# Patient Record
Sex: Male | Born: 1955
Health system: Southern US, Community
[De-identification: ages and names within clinical notes are randomized; demographics above are authoritative.]

## PROBLEM LIST (undated history)

## (undated) DIAGNOSIS — K219 Gastro-esophageal reflux disease without esophagitis: Secondary | ICD-10-CM

## (undated) DIAGNOSIS — E119 Type 2 diabetes mellitus without complications: Secondary | ICD-10-CM

## (undated) DIAGNOSIS — J45909 Unspecified asthma, uncomplicated: Secondary | ICD-10-CM

## (undated) DIAGNOSIS — I350 Nonrheumatic aortic (valve) stenosis: Secondary | ICD-10-CM

## (undated) DIAGNOSIS — I272 Pulmonary hypertension, unspecified: Secondary | ICD-10-CM

## (undated) DIAGNOSIS — D696 Thrombocytopenia, unspecified: Secondary | ICD-10-CM

## (undated) DIAGNOSIS — R011 Cardiac murmur, unspecified: Secondary | ICD-10-CM

## (undated) DIAGNOSIS — I700 Atherosclerosis of aorta: Secondary | ICD-10-CM

## (undated) DIAGNOSIS — I152 Hypertension secondary to endocrine disorders: Secondary | ICD-10-CM

## (undated) DIAGNOSIS — F1291 Cannabis use, unspecified, in remission: Secondary | ICD-10-CM

## (undated) DIAGNOSIS — I739 Peripheral vascular disease, unspecified: Secondary | ICD-10-CM

## (undated) DIAGNOSIS — I2510 Atherosclerotic heart disease of native coronary artery without angina pectoris: Secondary | ICD-10-CM

## (undated) DIAGNOSIS — I21A1 Myocardial infarction type 2: Secondary | ICD-10-CM

## (undated) DIAGNOSIS — E1159 Type 2 diabetes mellitus with other circulatory complications: Secondary | ICD-10-CM

## (undated) DIAGNOSIS — L97509 Non-pressure chronic ulcer of other part of unspecified foot with unspecified severity: Secondary | ICD-10-CM

## (undated) DIAGNOSIS — L97529 Non-pressure chronic ulcer of other part of left foot with unspecified severity: Secondary | ICD-10-CM

## (undated) DIAGNOSIS — I509 Heart failure, unspecified: Secondary | ICD-10-CM

## (undated) DIAGNOSIS — I10 Essential (primary) hypertension: Secondary | ICD-10-CM

## (undated) DIAGNOSIS — N289 Disorder of kidney and ureter, unspecified: Secondary | ICD-10-CM

## (undated) HISTORY — PX: CORONARY ARTERY BYPASS GRAFT: SHX141

## (undated) HISTORY — DX: Heart failure, unspecified: I50.9

## (undated) HISTORY — PX: ROTATOR CUFF REPAIR: SHX139

## (undated) HISTORY — DX: Type 2 diabetes mellitus with other circulatory complications: E11.59

## (undated) HISTORY — PX: CARDIAC SURGERY: SHX584

## (undated) HISTORY — DX: Unspecified asthma, uncomplicated: J45.909

## (undated) HISTORY — DX: Type 2 diabetes mellitus without complications: E11.9

## (undated) HISTORY — PX: CARDIAC CATHETERIZATION: SHX172

## (undated) HISTORY — DX: Hypertension secondary to endocrine disorders: I15.2

## (undated) HISTORY — PX: ANGIOPLASTY: SHX39

## (undated) HISTORY — DX: Essential (primary) hypertension: I10

---

## 1999-10-26 ENCOUNTER — Ambulatory Visit (HOSPITAL_COMMUNITY): Admission: RE | Admit: 1999-10-26 | Discharge: 1999-10-26 | Payer: Self-pay | Admitting: Orthopaedic Surgery

## 1999-11-13 ENCOUNTER — Ambulatory Visit (HOSPITAL_COMMUNITY): Admission: RE | Admit: 1999-11-13 | Discharge: 1999-11-13 | Payer: Self-pay | Admitting: Orthopaedic Surgery

## 1999-11-13 ENCOUNTER — Encounter: Payer: Self-pay | Admitting: Neurosurgery

## 1999-11-27 ENCOUNTER — Ambulatory Visit (HOSPITAL_COMMUNITY): Admission: RE | Admit: 1999-11-27 | Discharge: 1999-11-27 | Payer: Self-pay | Admitting: Orthopaedic Surgery

## 2001-06-03 DIAGNOSIS — I2510 Atherosclerotic heart disease of native coronary artery without angina pectoris: Secondary | ICD-10-CM

## 2001-06-03 DIAGNOSIS — Z951 Presence of aortocoronary bypass graft: Secondary | ICD-10-CM | POA: Insufficient documentation

## 2001-06-03 HISTORY — DX: Presence of aortocoronary bypass graft: Z95.1

## 2001-06-03 HISTORY — DX: Atherosclerotic heart disease of native coronary artery without angina pectoris: I25.10

## 2001-06-03 HISTORY — PX: CORONARY ARTERY BYPASS GRAFT: SHX141

## 2001-06-23 ENCOUNTER — Encounter: Payer: Self-pay | Admitting: Cardiothoracic Surgery

## 2001-06-25 ENCOUNTER — Inpatient Hospital Stay (HOSPITAL_COMMUNITY): Admission: RE | Admit: 2001-06-25 | Discharge: 2001-06-30 | Payer: Self-pay | Admitting: Cardiothoracic Surgery

## 2001-06-25 ENCOUNTER — Encounter: Payer: Self-pay | Admitting: Cardiothoracic Surgery

## 2001-06-26 ENCOUNTER — Encounter: Payer: Self-pay | Admitting: Cardiothoracic Surgery

## 2001-06-27 ENCOUNTER — Encounter: Payer: Self-pay | Admitting: Cardiothoracic Surgery

## 2001-06-28 ENCOUNTER — Encounter: Payer: Self-pay | Admitting: Surgery

## 2001-06-29 ENCOUNTER — Encounter: Payer: Self-pay | Admitting: Cardiothoracic Surgery

## 2005-01-23 ENCOUNTER — Inpatient Hospital Stay (HOSPITAL_COMMUNITY): Admission: EM | Admit: 2005-01-23 | Discharge: 2005-01-26 | Payer: Self-pay | Admitting: Emergency Medicine

## 2005-01-23 IMAGING — CR DG CHEST 1V PORT
1 series · 1 of 1 positions shown · non-contrast
Comparison: Report dated [DATE].

CLINICAL DATA: Chest pain. Short of breath. Hypertension. Diabetes. Previous
CABG.

PORTABLE CHEST - 1 VIEW

[view not recorded]
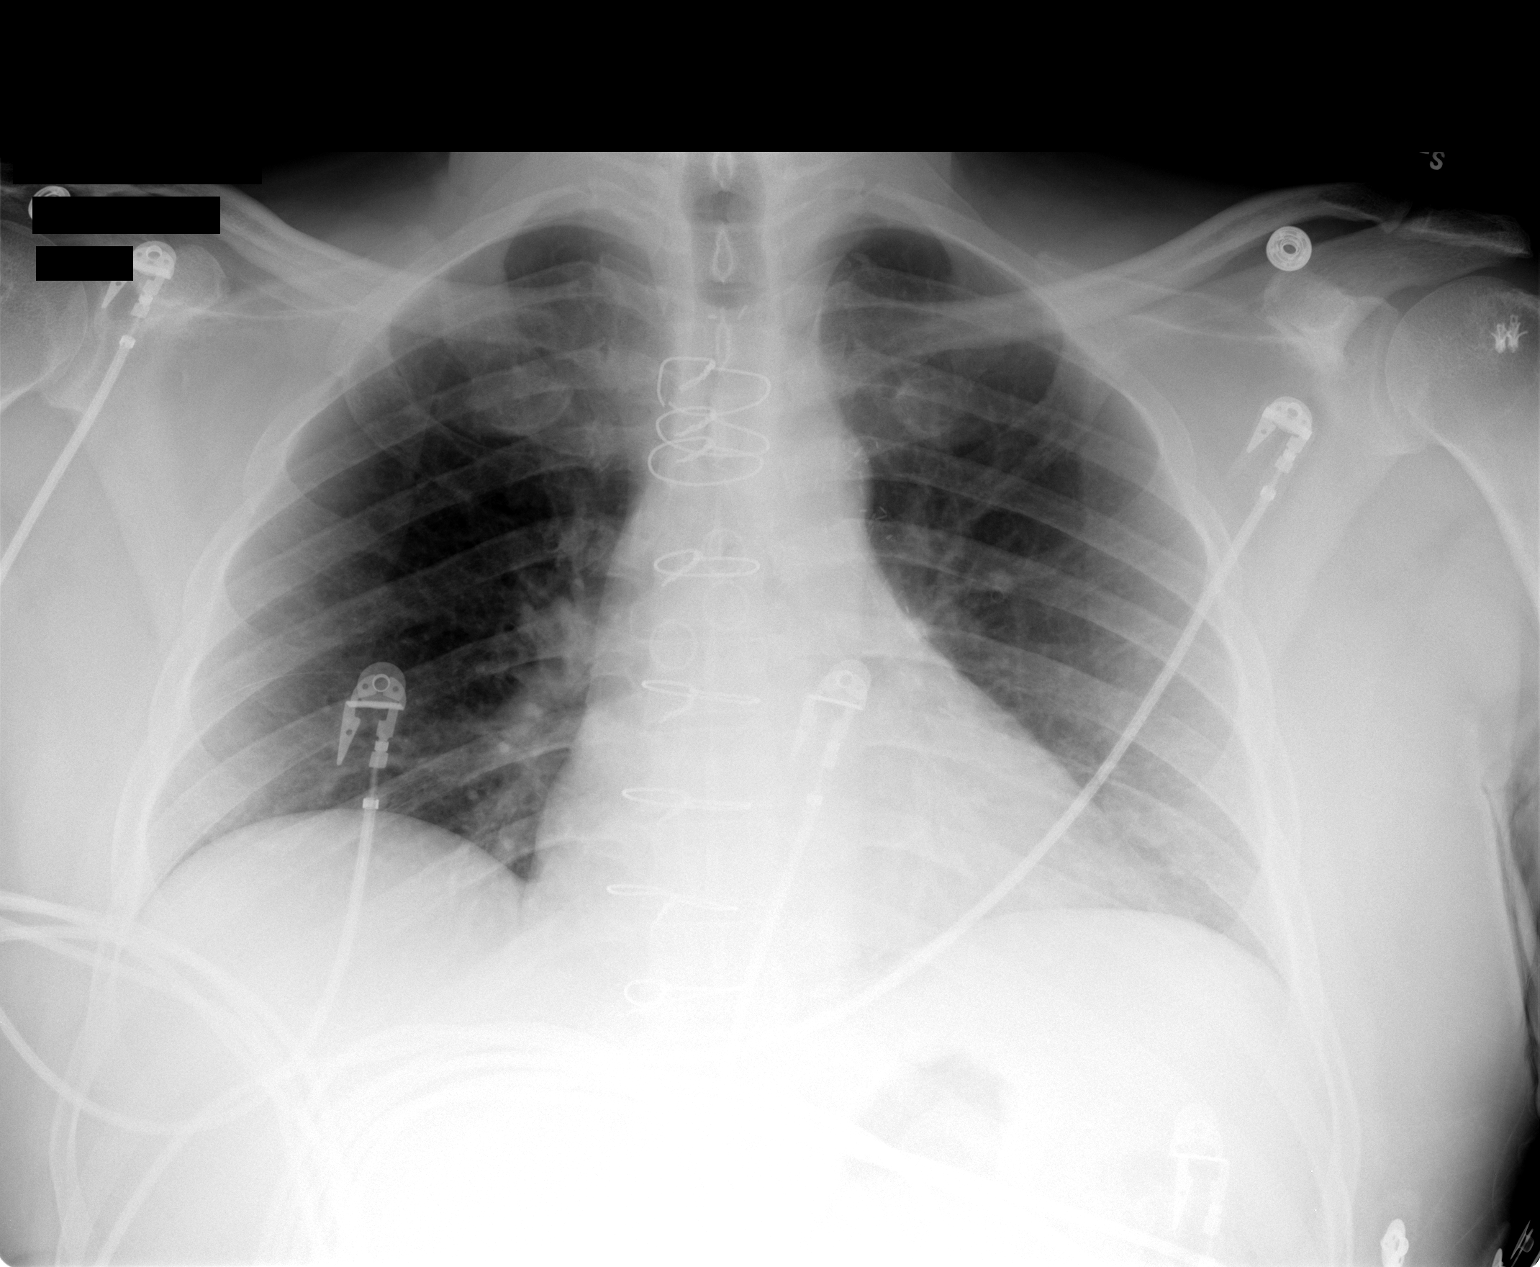

[1 of 1 positions shown; findings below may reference images not displayed]

FINDINGS: Poor inspiration. Normal sized heart. Post CABG changes. Clear lungs
with normal vascularity. Unremarkable bones.  

IMPRESSION

No acute abnormality.

## 2013-08-23 ENCOUNTER — Ambulatory Visit (INDEPENDENT_AMBULATORY_CARE_PROVIDER_SITE_OTHER): Payer: BC Managed Care – PPO | Admitting: Podiatry

## 2013-08-23 ENCOUNTER — Ambulatory Visit (INDEPENDENT_AMBULATORY_CARE_PROVIDER_SITE_OTHER): Payer: BC Managed Care – PPO

## 2013-08-23 ENCOUNTER — Encounter: Payer: Self-pay | Admitting: Podiatry

## 2013-08-23 VITALS — BP 157/99 | HR 75 | Resp 18 | Ht 72.0 in | Wt 220.0 lb

## 2013-08-23 DIAGNOSIS — E1059 Type 1 diabetes mellitus with other circulatory complications: Secondary | ICD-10-CM

## 2013-08-23 DIAGNOSIS — M79609 Pain in unspecified limb: Secondary | ICD-10-CM

## 2013-08-23 DIAGNOSIS — E1049 Type 1 diabetes mellitus with other diabetic neurological complication: Secondary | ICD-10-CM

## 2013-08-23 DIAGNOSIS — M79672 Pain in left foot: Principal | ICD-10-CM

## 2013-08-23 DIAGNOSIS — M79671 Pain in right foot: Secondary | ICD-10-CM

## 2013-08-23 NOTE — Progress Notes (Signed)
   Subjective:    Patient ID: Phillip [name], male    DOB: 09-01-1955, 58 y.o.   MRN: [id number]  HPI Comments: Diabetic and been having a lot of trouble with feet , burning stinging , tingling. Taking gabapentin and it is not working . Diabetic since 2003 . Last blood sugar was done Saturday morning and it was over 300 fasting   Diabetes      Review of Systems  Endocrine:       Diabetic  Neurological: Positive for numbness.  All other systems reviewed and are negative.       Objective:   Physical Exam: I have reviewed his past history medications allergies surgeries and social history. He states that his blood sugar routinely runs off the chart. Pulses are nonpalpable bilateral capillary fill time to digits one through 5 is sluggish. Neurologic sensorium is not intact to the midfoot bilateral. Deep tendon reflexes are not elicitable bilateral. Orthopedic evaluation demonstrates rectus foot type mild pes planus mild hallux abductovalgus and hammertoe deformities but are asymptomatic. Muscle strength +5 over 5 dorsiflexors plantar flexors inverters and evertors. Radiographic evaluation demonstrates mild pes planus osteoarthritic changes midfoot bilateral. Cutaneous evaluation demonstrates no erythema edema cellulitis drainage or odor he does have hemosiderin deposition or what appears to be small petechiae. These lesions seem to coalesce into a large mass.        Assessment & Plan:  Assessment: Severe peripheral neuropathy secondary to diabetes. Severe diabetic peripheral angiopathy.  Plan: Discussed etiology pathology conservative versus surgical therapies I suggested that he be seen immediately by vascular surgery. Once this is taking care of he will followup with Dr. crisp or chronic diabetic peripheral neuropathy.

## 2013-08-23 NOTE — Patient Instructions (Signed)
Diabetes and Foot Care Diabetes may cause you to have problems because of poor blood supply (circulation) to your feet and legs. This may cause the skin on your feet to become thinner, break easier, and heal more slowly. Your skin may become dry, and the skin may peel and crack. You may also have nerve damage in your legs and feet causing decreased feeling in them. You may not notice minor injuries to your feet that could lead to infections or more serious problems. Taking care of your feet is one of the most important things you can do for yourself.  HOME CARE INSTRUCTIONS  Wear shoes at all times, even in the house. Do not go barefoot. Bare feet are easily injured.  Check your feet daily for blisters, cuts, and redness. If you cannot see the bottom of your feet, use a mirror or ask someone for help.  Wash your feet with warm water (do not use hot water) and mild soap. Then pat your feet and the areas between your toes until they are completely dry. Do not soak your feet as this can dry your skin.  Apply a moisturizing lotion or petroleum jelly (that does not contain alcohol and is unscented) to the skin on your feet and to dry, brittle toenails. Do not apply lotion between your toes.  Trim your toenails straight across. Do not dig under them or around the cuticle. File the edges of your nails with an emery board or nail file.  Do not cut corns or calluses or try to remove them with medicine.  Wear clean socks or stockings every day. Make sure they are not too tight. Do not wear knee-high stockings since they may decrease blood flow to your legs.  Wear shoes that fit properly and have enough cushioning. To break in new shoes, wear them for just a few hours a day. This prevents you from injuring your feet. Always look in your shoes before you put them on to be sure there are no objects inside.  Do not cross your legs. This may decrease the blood flow to your feet.  If you find a minor scrape,  cut, or break in the skin on your feet, keep it and the skin around it clean and dry. These areas may be cleansed with mild soap and water. Do not cleanse the area with peroxide, alcohol, or iodine.  When you remove an adhesive bandage, be sure not to damage the skin around it.  If you have a wound, look at it several times a day to make sure it is healing.  Do not use heating pads or hot water bottles. They may burn your skin. If you have lost feeling in your feet or legs, you may not know it is happening until it is too late.  Make sure your health care provider performs a complete foot exam at least annually or more often if you have foot problems. Report any cuts, sores, or bruises to your health care provider immediately. SEEK MEDICAL CARE IF:   You have an injury that is not healing.  You have cuts or breaks in the skin.  You have an ingrown nail.  You notice redness on your legs or feet.  You feel burning or tingling in your legs or feet.  You have pain or cramps in your legs and feet.  Your legs or feet are numb.  Your feet always feel cold. SEEK IMMEDIATE MEDICAL CARE IF:   There is increasing redness,   swelling, or pain in or around a wound.  There is a red line that goes up your leg.  Pus is coming from a wound.  You develop a fever or as directed by your health care provider.  You notice a bad smell coming from an ulcer or wound. Document Released: 05/17/2000 Document Revised: 01/20/2013 Document Reviewed: 10/27/2012 [employer] Patient Information 2014 [employer].  

## 2013-08-30 ENCOUNTER — Ambulatory Visit: Payer: Self-pay | Admitting: Vascular Surgery

## 2013-08-30 LAB — BASIC METABOLIC PANEL
Anion Gap: 4 — ABNORMAL LOW (ref 7–16)
BUN: 17 mg/dL (ref 7–18)
CHLORIDE: 101 mmol/L (ref 98–107)
CO2: 31 mmol/L (ref 21–32)
Calcium, Total: 9.4 mg/dL (ref 8.5–10.1)
Creatinine: 1.15 mg/dL (ref 0.60–1.30)
EGFR (Non-African Amer.): >60
Glucose: 310 mg/dL — ABNORMAL HIGH (ref 65–99)
Osmolality: 285 (ref 275–301)
Potassium: 3.7 mmol/L (ref 3.5–5.1)
Sodium: 136 mmol/L (ref 136–145)

## 2014-09-24 NOTE — Op Note (Signed)
PATIENT NAME:  KASAN, LIGHTLE MR#:  [id number] DATE OF BIRTH:  1955-10-31  DATE OF PROCEDURE:  08/30/2013  PREOPERATIVE DIAGNOSES:  1.  Peripheral arterial disease with claudication, right lower extremity.  2.  Neuropathy, both lower extremities.  3.  Diabetes.  4.  Hypertension.  5.  Coronary disease.   POSTOPERATIVE DIAGNOSES: 1.  Peripheral arterial disease with claudication, right lower extremity.  2.  Neuropathy, both lower extremities.  3.  Diabetes.  4.  Hypertension.  5.  Coronary disease.   PROCEDURES:  1.  Catheter placement into right peroneal artery from left femoral approach.  2.  Aortogram and selective right lower extremity angiogram.  3.  Percutaneous transluminal angioplasty of right tibioperoneal trunk and peroneal arteries with 3 mm diameter angioplasty balloon.  4.  Percutaneous transluminal angioplasty of right popliteal artery with a 4 mm diameter angioplasty balloon.  5.  StarClose closure device, left femoral artery.   SURGEON: Leotis Pain, M.D.   ANESTHESIA: Local with moderate conscious sedation.   ESTIMATED BLOOD LOSS: 25 mL.  FLUOROSCOPY TIME: Approximately 6 minutes.   CONTRAST USED: 55 mL.   INDICATION FOR PROCEDURE: This is a 59 year old male, who was referred to my office for evaluation of peripheral vascular disease. He was found to have a significant difference in his ABIs and his right lower extremity was the more painful leg. He also had some neuropathic symptoms. He described cramping pain in his muscles with activity that was very consistent with claudication. His ABI on the right was reduced and he is brought in for angiography for further evaluation and potential treatment. Risks and benefits were discussed. Informed consent was obtained.   DESCRIPTION OF PROCEDURE: The patient is brought to the vascular suite. Groins were shaved and prepped and a sterile surgical field was created. The left femoral head was localized with fluoroscopy, left  femoral artery was accessed without difficulty with a Seldinger needle. A J-wire and a 5-French sheath were then placed. Pigtail catheter was placed at the L-1 level and AP aortogram was performed. This demonstrated co-dominant right renal arteries and a single left renal artery. All of these were widely patent. The aorta and iliac segments were widely patent. I then crossed the aortic bifurcation and advanced to the right femoral head and selective right lower extremity angiogram was then performed. This demonstrated no significant stenosis in the common femoral artery, profunda femoris artery or superficial femoral artery. The popliteal artery was patent above the knee. Below the knee, there was an area of stenosis at the tibial trifurcation just above the anterior tibial artery. The tibioperoneal trunk then had some moderate disease, but occluded in the distal tibioperoneal trunk in a separate distinct lesion. The peroneal artery received the best runoff distally reconstituting after being occluded for the first 8 to 10 cm. The anterior tibial artery was patent proximally, but then occluded in the mid leg and helped fill the foot through collaterals. The posterior tibial artery was chronically occluded. The patient was systemically heparinized. A 6-French Ansell sheath was placed over a Terumo advantage wire. I was able to, with the help of a Kumpe and then a CXI catheter, a 0.035 Advantage wire, V18 wire and then an 0.018 Advantage wire traversed through the popliteal stenosis initially, then the tibioperoneal trunk occlusion. I was able to navigate down into the peroneal artery and crossed the occlusion with a CXI catheter and the 0.018 Advantage wire and confirm intraluminal flow in the peroneal artery distally. I then replaced  the wire.   The peroneal occlusion and tibioperoneal trunk occlusion were treated with a 3 mm diameter angioplasty balloon with a good angiographic completion result. I then used a 4  mm diameter Lutonix angioplasty balloon to treat the proximal tibioperoneal trunk and distal popliteal artery, which was a separate distinct lesion. The drug-coated balloon was inflated for 1 minute and then deflated and completion angiogram now showed this area to be widely patent with less than 20% residual stenosis. At this point, I elected to terminate the procedure. The sheath was pulled back to the ipsilateral external iliac artery and oblique arteriogram was performed. StarClose closure device was deployed in the usual fashion with excellent hemostatic result. The patient tolerated the procedure well and was taken to the recovery room in stable condition.  ____________________________ Algernon Huxley, MD jsd:aw D: 08/30/2013 12:32:54 ET T: 08/30/2013 12:55:47 ET JOB#: [id number]  cc: Algernon Huxley, MD, <Dictator> Cyndi Bender, MD Algernon Huxley MD ELECTRONICALLY SIGNED 09/06/2013 9:51

## 2015-07-26 DIAGNOSIS — R1310 Dysphagia, unspecified: Secondary | ICD-10-CM | POA: Insufficient documentation

## 2015-07-26 DIAGNOSIS — R0989 Other specified symptoms and signs involving the circulatory and respiratory systems: Secondary | ICD-10-CM

## 2015-07-26 DIAGNOSIS — I2510 Atherosclerotic heart disease of native coronary artery without angina pectoris: Secondary | ICD-10-CM

## 2015-07-26 DIAGNOSIS — R011 Cardiac murmur, unspecified: Secondary | ICD-10-CM | POA: Insufficient documentation

## 2015-07-26 DIAGNOSIS — G4730 Sleep apnea, unspecified: Secondary | ICD-10-CM | POA: Insufficient documentation

## 2015-07-26 DIAGNOSIS — Z79899 Other long term (current) drug therapy: Secondary | ICD-10-CM

## 2015-07-26 DIAGNOSIS — Z794 Long term (current) use of insulin: Secondary | ICD-10-CM

## 2015-07-26 DIAGNOSIS — E785 Hyperlipidemia, unspecified: Secondary | ICD-10-CM | POA: Insufficient documentation

## 2015-07-26 DIAGNOSIS — N183 Chronic kidney disease, stage 3 unspecified: Secondary | ICD-10-CM

## 2015-07-26 DIAGNOSIS — I509 Heart failure, unspecified: Secondary | ICD-10-CM

## 2015-07-26 DIAGNOSIS — I5022 Chronic systolic (congestive) heart failure: Secondary | ICD-10-CM | POA: Insufficient documentation

## 2015-07-26 DIAGNOSIS — I5042 Chronic combined systolic (congestive) and diastolic (congestive) heart failure: Secondary | ICD-10-CM

## 2015-07-26 DIAGNOSIS — E1122 Type 2 diabetes mellitus with diabetic chronic kidney disease: Secondary | ICD-10-CM | POA: Insufficient documentation

## 2015-07-26 DIAGNOSIS — N184 Chronic kidney disease, stage 4 (severe): Secondary | ICD-10-CM | POA: Insufficient documentation

## 2015-07-26 DIAGNOSIS — E1169 Type 2 diabetes mellitus with other specified complication: Secondary | ICD-10-CM | POA: Insufficient documentation

## 2015-07-26 DIAGNOSIS — I739 Peripheral vascular disease, unspecified: Secondary | ICD-10-CM

## 2015-07-26 DIAGNOSIS — I5023 Acute on chronic systolic (congestive) heart failure: Secondary | ICD-10-CM | POA: Insufficient documentation

## 2015-07-26 HISTORY — DX: Cardiac murmur, unspecified: R01.1

## 2015-07-26 HISTORY — DX: Other long term (current) drug therapy: Z79.899

## 2015-07-26 HISTORY — DX: Sleep apnea, unspecified: G47.30

## 2015-07-26 HISTORY — DX: Other specified symptoms and signs involving the circulatory and respiratory systems: R09.89

## 2015-07-26 HISTORY — DX: Type 2 diabetes mellitus with diabetic chronic kidney disease: E11.22

## 2015-07-26 HISTORY — DX: Type 2 diabetes mellitus with diabetic chronic kidney disease: N18.4

## 2015-07-26 HISTORY — DX: Chronic systolic (congestive) heart failure: I50.22

## 2015-07-26 HISTORY — DX: Atherosclerotic heart disease of native coronary artery without angina pectoris: I25.10

## 2015-07-26 HISTORY — DX: Chronic kidney disease, stage 3 unspecified: N18.30

## 2015-07-26 HISTORY — DX: Long term (current) use of insulin: Z79.4

## 2015-07-26 HISTORY — DX: Chronic combined systolic (congestive) and diastolic (congestive) heart failure: I50.42

## 2015-07-26 HISTORY — DX: Heart failure, unspecified: I50.9

## 2015-07-26 HISTORY — DX: Hyperlipidemia, unspecified: E78.5

## 2015-07-26 HISTORY — DX: Peripheral vascular disease, unspecified: I73.9

## 2015-07-26 HISTORY — DX: Dysphagia, unspecified: R13.10

## 2015-07-26 HISTORY — DX: Acute on chronic systolic (congestive) heart failure: I50.23

## 2015-07-27 DIAGNOSIS — R319 Hematuria, unspecified: Secondary | ICD-10-CM | POA: Insufficient documentation

## 2015-07-27 HISTORY — DX: Hematuria, unspecified: R31.9

## 2015-11-14 DIAGNOSIS — E039 Hypothyroidism, unspecified: Secondary | ICD-10-CM | POA: Insufficient documentation

## 2015-11-14 HISTORY — DX: Hypothyroidism, unspecified: E03.9

## 2016-06-12 DIAGNOSIS — E113413 Type 2 diabetes mellitus with severe nonproliferative diabetic retinopathy with macular edema, bilateral: Secondary | ICD-10-CM | POA: Diagnosis not present

## 2016-06-12 DIAGNOSIS — Z794 Long term (current) use of insulin: Secondary | ICD-10-CM | POA: Diagnosis not present

## 2016-06-12 DIAGNOSIS — H2513 Age-related nuclear cataract, bilateral: Secondary | ICD-10-CM | POA: Diagnosis not present

## 2016-07-31 DIAGNOSIS — H2513 Age-related nuclear cataract, bilateral: Secondary | ICD-10-CM | POA: Diagnosis not present

## 2016-07-31 DIAGNOSIS — E113413 Type 2 diabetes mellitus with severe nonproliferative diabetic retinopathy with macular edema, bilateral: Secondary | ICD-10-CM | POA: Diagnosis not present

## 2016-09-18 DIAGNOSIS — H2513 Age-related nuclear cataract, bilateral: Secondary | ICD-10-CM | POA: Diagnosis not present

## 2016-09-18 DIAGNOSIS — E113413 Type 2 diabetes mellitus with severe nonproliferative diabetic retinopathy with macular edema, bilateral: Secondary | ICD-10-CM | POA: Diagnosis not present

## 2016-10-16 DIAGNOSIS — H2513 Age-related nuclear cataract, bilateral: Secondary | ICD-10-CM | POA: Diagnosis not present

## 2016-10-16 DIAGNOSIS — E113413 Type 2 diabetes mellitus with severe nonproliferative diabetic retinopathy with macular edema, bilateral: Secondary | ICD-10-CM | POA: Diagnosis not present

## 2017-01-08 DIAGNOSIS — E1140 Type 2 diabetes mellitus with diabetic neuropathy, unspecified: Secondary | ICD-10-CM | POA: Diagnosis not present

## 2017-01-08 DIAGNOSIS — I129 Hypertensive chronic kidney disease with stage 1 through stage 4 chronic kidney disease, or unspecified chronic kidney disease: Secondary | ICD-10-CM | POA: Diagnosis not present

## 2017-01-08 DIAGNOSIS — N183 Chronic kidney disease, stage 3 (moderate): Secondary | ICD-10-CM | POA: Diagnosis not present

## 2017-01-08 DIAGNOSIS — I25118 Atherosclerotic heart disease of native coronary artery with other forms of angina pectoris: Secondary | ICD-10-CM | POA: Diagnosis not present

## 2017-01-08 DIAGNOSIS — I130 Hypertensive heart and chronic kidney disease with heart failure and stage 1 through stage 4 chronic kidney disease, or unspecified chronic kidney disease: Secondary | ICD-10-CM | POA: Diagnosis not present

## 2017-01-08 DIAGNOSIS — E0842 Diabetes mellitus due to underlying condition with diabetic polyneuropathy: Secondary | ICD-10-CM | POA: Diagnosis not present

## 2017-01-08 DIAGNOSIS — I5020 Unspecified systolic (congestive) heart failure: Secondary | ICD-10-CM | POA: Diagnosis not present

## 2017-01-08 DIAGNOSIS — E785 Hyperlipidemia, unspecified: Secondary | ICD-10-CM | POA: Diagnosis not present

## 2017-01-29 DIAGNOSIS — Z794 Long term (current) use of insulin: Secondary | ICD-10-CM | POA: Diagnosis not present

## 2017-01-29 DIAGNOSIS — E113413 Type 2 diabetes mellitus with severe nonproliferative diabetic retinopathy with macular edema, bilateral: Secondary | ICD-10-CM | POA: Diagnosis not present

## 2017-02-26 DIAGNOSIS — E113413 Type 2 diabetes mellitus with severe nonproliferative diabetic retinopathy with macular edema, bilateral: Secondary | ICD-10-CM | POA: Diagnosis not present

## 2017-02-26 DIAGNOSIS — H2513 Age-related nuclear cataract, bilateral: Secondary | ICD-10-CM | POA: Diagnosis not present

## 2017-03-10 DIAGNOSIS — N183 Chronic kidney disease, stage 3 (moderate): Secondary | ICD-10-CM | POA: Diagnosis not present

## 2017-04-09 DIAGNOSIS — H2513 Age-related nuclear cataract, bilateral: Secondary | ICD-10-CM | POA: Diagnosis not present

## 2017-04-09 DIAGNOSIS — E113213 Type 2 diabetes mellitus with mild nonproliferative diabetic retinopathy with macular edema, bilateral: Secondary | ICD-10-CM | POA: Diagnosis not present

## 2017-04-16 DIAGNOSIS — H3581 Retinal edema: Secondary | ICD-10-CM | POA: Diagnosis not present

## 2017-05-21 DIAGNOSIS — E113413 Type 2 diabetes mellitus with severe nonproliferative diabetic retinopathy with macular edema, bilateral: Secondary | ICD-10-CM | POA: Diagnosis not present

## 2017-05-21 DIAGNOSIS — H2513 Age-related nuclear cataract, bilateral: Secondary | ICD-10-CM | POA: Diagnosis not present

## 2017-06-18 DIAGNOSIS — E113213 Type 2 diabetes mellitus with mild nonproliferative diabetic retinopathy with macular edema, bilateral: Secondary | ICD-10-CM | POA: Diagnosis not present

## 2017-06-18 DIAGNOSIS — H2513 Age-related nuclear cataract, bilateral: Secondary | ICD-10-CM | POA: Diagnosis not present

## 2017-07-14 DIAGNOSIS — N529 Male erectile dysfunction, unspecified: Secondary | ICD-10-CM | POA: Insufficient documentation

## 2017-07-14 DIAGNOSIS — E1140 Type 2 diabetes mellitus with diabetic neuropathy, unspecified: Secondary | ICD-10-CM | POA: Diagnosis not present

## 2017-07-14 DIAGNOSIS — E0842 Diabetes mellitus due to underlying condition with diabetic polyneuropathy: Secondary | ICD-10-CM | POA: Diagnosis not present

## 2017-07-14 DIAGNOSIS — E782 Mixed hyperlipidemia: Secondary | ICD-10-CM | POA: Diagnosis not present

## 2017-07-14 DIAGNOSIS — I5020 Unspecified systolic (congestive) heart failure: Secondary | ICD-10-CM | POA: Diagnosis not present

## 2017-07-14 DIAGNOSIS — I130 Hypertensive heart and chronic kidney disease with heart failure and stage 1 through stage 4 chronic kidney disease, or unspecified chronic kidney disease: Secondary | ICD-10-CM | POA: Diagnosis not present

## 2017-07-14 DIAGNOSIS — E039 Hypothyroidism, unspecified: Secondary | ICD-10-CM | POA: Diagnosis not present

## 2017-07-14 DIAGNOSIS — I25118 Atherosclerotic heart disease of native coronary artery with other forms of angina pectoris: Secondary | ICD-10-CM | POA: Diagnosis not present

## 2017-07-14 HISTORY — DX: Male erectile dysfunction, unspecified: N52.9

## 2017-07-18 DIAGNOSIS — E113413 Type 2 diabetes mellitus with severe nonproliferative diabetic retinopathy with macular edema, bilateral: Secondary | ICD-10-CM | POA: Diagnosis not present

## 2017-07-18 DIAGNOSIS — H2513 Age-related nuclear cataract, bilateral: Secondary | ICD-10-CM | POA: Diagnosis not present

## 2017-07-18 DIAGNOSIS — Z794 Long term (current) use of insulin: Secondary | ICD-10-CM | POA: Diagnosis not present

## 2017-08-12 DIAGNOSIS — Z79899 Other long term (current) drug therapy: Secondary | ICD-10-CM | POA: Diagnosis not present

## 2017-08-12 DIAGNOSIS — I129 Hypertensive chronic kidney disease with stage 1 through stage 4 chronic kidney disease, or unspecified chronic kidney disease: Secondary | ICD-10-CM | POA: Diagnosis not present

## 2017-08-12 DIAGNOSIS — N183 Chronic kidney disease, stage 3 (moderate): Secondary | ICD-10-CM | POA: Diagnosis not present

## 2017-08-12 DIAGNOSIS — E0842 Diabetes mellitus due to underlying condition with diabetic polyneuropathy: Secondary | ICD-10-CM | POA: Diagnosis not present

## 2017-09-05 DIAGNOSIS — E113413 Type 2 diabetes mellitus with severe nonproliferative diabetic retinopathy with macular edema, bilateral: Secondary | ICD-10-CM | POA: Diagnosis not present

## 2017-09-05 DIAGNOSIS — H2513 Age-related nuclear cataract, bilateral: Secondary | ICD-10-CM | POA: Diagnosis not present

## 2017-10-03 DIAGNOSIS — H2513 Age-related nuclear cataract, bilateral: Secondary | ICD-10-CM | POA: Diagnosis not present

## 2017-10-03 DIAGNOSIS — E113413 Type 2 diabetes mellitus with severe nonproliferative diabetic retinopathy with macular edema, bilateral: Secondary | ICD-10-CM | POA: Diagnosis not present

## 2017-10-03 DIAGNOSIS — Z794 Long term (current) use of insulin: Secondary | ICD-10-CM | POA: Diagnosis not present

## 2018-01-14 DIAGNOSIS — E1122 Type 2 diabetes mellitus with diabetic chronic kidney disease: Secondary | ICD-10-CM | POA: Diagnosis not present

## 2018-01-14 DIAGNOSIS — I5020 Unspecified systolic (congestive) heart failure: Secondary | ICD-10-CM | POA: Diagnosis not present

## 2018-01-14 DIAGNOSIS — E039 Hypothyroidism, unspecified: Secondary | ICD-10-CM | POA: Diagnosis not present

## 2018-01-14 DIAGNOSIS — E782 Mixed hyperlipidemia: Secondary | ICD-10-CM | POA: Diagnosis not present

## 2018-01-14 DIAGNOSIS — Z0000 Encounter for general adult medical examination without abnormal findings: Secondary | ICD-10-CM | POA: Diagnosis not present

## 2018-01-14 DIAGNOSIS — I129 Hypertensive chronic kidney disease with stage 1 through stage 4 chronic kidney disease, or unspecified chronic kidney disease: Secondary | ICD-10-CM | POA: Diagnosis not present

## 2018-01-14 DIAGNOSIS — N400 Enlarged prostate without lower urinary tract symptoms: Secondary | ICD-10-CM

## 2018-01-14 DIAGNOSIS — Z79899 Other long term (current) drug therapy: Secondary | ICD-10-CM | POA: Diagnosis not present

## 2018-01-14 DIAGNOSIS — Z125 Encounter for screening for malignant neoplasm of prostate: Secondary | ICD-10-CM | POA: Diagnosis not present

## 2018-01-14 DIAGNOSIS — I25118 Atherosclerotic heart disease of native coronary artery with other forms of angina pectoris: Secondary | ICD-10-CM | POA: Diagnosis not present

## 2018-01-14 DIAGNOSIS — Z794 Long term (current) use of insulin: Secondary | ICD-10-CM | POA: Diagnosis not present

## 2018-01-14 DIAGNOSIS — R319 Hematuria, unspecified: Secondary | ICD-10-CM | POA: Diagnosis not present

## 2018-01-14 DIAGNOSIS — N183 Chronic kidney disease, stage 3 (moderate): Secondary | ICD-10-CM | POA: Diagnosis not present

## 2018-01-14 HISTORY — DX: Benign prostatic hyperplasia without lower urinary tract symptoms: N40.0

## 2018-01-15 DIAGNOSIS — E1122 Type 2 diabetes mellitus with diabetic chronic kidney disease: Secondary | ICD-10-CM | POA: Diagnosis not present

## 2018-01-15 DIAGNOSIS — R319 Hematuria, unspecified: Secondary | ICD-10-CM | POA: Diagnosis not present

## 2018-01-15 DIAGNOSIS — Z794 Long term (current) use of insulin: Secondary | ICD-10-CM | POA: Diagnosis not present

## 2018-01-15 DIAGNOSIS — N183 Chronic kidney disease, stage 3 (moderate): Secondary | ICD-10-CM | POA: Diagnosis not present

## 2018-02-06 DIAGNOSIS — S90411A Abrasion, right great toe, initial encounter: Secondary | ICD-10-CM | POA: Diagnosis not present

## 2018-02-16 DIAGNOSIS — E113413 Type 2 diabetes mellitus with severe nonproliferative diabetic retinopathy with macular edema, bilateral: Secondary | ICD-10-CM | POA: Diagnosis not present

## 2018-02-16 DIAGNOSIS — H2513 Age-related nuclear cataract, bilateral: Secondary | ICD-10-CM | POA: Diagnosis not present

## 2018-02-17 DIAGNOSIS — E1142 Type 2 diabetes mellitus with diabetic polyneuropathy: Secondary | ICD-10-CM | POA: Diagnosis not present

## 2018-02-17 DIAGNOSIS — M86172 Other acute osteomyelitis, left ankle and foot: Secondary | ICD-10-CM | POA: Diagnosis not present

## 2018-02-17 DIAGNOSIS — B351 Tinea unguium: Secondary | ICD-10-CM | POA: Diagnosis not present

## 2018-02-17 DIAGNOSIS — M79672 Pain in left foot: Secondary | ICD-10-CM | POA: Diagnosis not present

## 2018-02-17 DIAGNOSIS — E1152 Type 2 diabetes mellitus with diabetic peripheral angiopathy with gangrene: Secondary | ICD-10-CM | POA: Diagnosis not present

## 2018-02-17 DIAGNOSIS — L97523 Non-pressure chronic ulcer of other part of left foot with necrosis of muscle: Secondary | ICD-10-CM | POA: Diagnosis not present

## 2018-02-18 DIAGNOSIS — Z1211 Encounter for screening for malignant neoplasm of colon: Secondary | ICD-10-CM

## 2018-02-18 DIAGNOSIS — Z0000 Encounter for general adult medical examination without abnormal findings: Secondary | ICD-10-CM | POA: Diagnosis not present

## 2018-02-18 HISTORY — DX: Encounter for screening for malignant neoplasm of colon: Z12.11

## 2018-02-20 ENCOUNTER — Telehealth (INDEPENDENT_AMBULATORY_CARE_PROVIDER_SITE_OTHER): Payer: Self-pay

## 2018-02-20 NOTE — Telephone Encounter (Signed)
Patient wife([name])called stating that her husband is having pain from the infected big toe.The patient has an appointment on Tuesday 02/24/18 with Dew to be seen in the office.I spoke with Dew and he advise if the patient continue to have pain that he can  go the ER to evaluated

## 2018-02-24 ENCOUNTER — Encounter (INDEPENDENT_AMBULATORY_CARE_PROVIDER_SITE_OTHER): Payer: Self-pay

## 2018-02-24 ENCOUNTER — Encounter (INDEPENDENT_AMBULATORY_CARE_PROVIDER_SITE_OTHER): Payer: Self-pay | Admitting: Vascular Surgery

## 2018-02-24 ENCOUNTER — Ambulatory Visit (INDEPENDENT_AMBULATORY_CARE_PROVIDER_SITE_OTHER): Payer: PPO | Admitting: Vascular Surgery

## 2018-02-24 VITALS — BP 141/70 | HR 69 | Resp 16 | Ht 72.0 in | Wt 223.0 lb

## 2018-02-24 DIAGNOSIS — E1159 Type 2 diabetes mellitus with other circulatory complications: Secondary | ICD-10-CM

## 2018-02-24 DIAGNOSIS — I7025 Atherosclerosis of native arteries of other extremities with ulceration: Secondary | ICD-10-CM

## 2018-02-24 DIAGNOSIS — N184 Chronic kidney disease, stage 4 (severe): Secondary | ICD-10-CM | POA: Insufficient documentation

## 2018-02-24 DIAGNOSIS — I110 Hypertensive heart disease with heart failure: Secondary | ICD-10-CM | POA: Diagnosis not present

## 2018-02-24 DIAGNOSIS — E1140 Type 2 diabetes mellitus with diabetic neuropathy, unspecified: Secondary | ICD-10-CM | POA: Diagnosis not present

## 2018-02-24 DIAGNOSIS — I70213 Atherosclerosis of native arteries of extremities with intermittent claudication, bilateral legs: Secondary | ICD-10-CM

## 2018-02-24 DIAGNOSIS — I509 Heart failure, unspecified: Secondary | ICD-10-CM

## 2018-02-24 DIAGNOSIS — Z79899 Other long term (current) drug therapy: Secondary | ICD-10-CM | POA: Diagnosis not present

## 2018-02-24 DIAGNOSIS — I10 Essential (primary) hypertension: Secondary | ICD-10-CM | POA: Insufficient documentation

## 2018-02-24 DIAGNOSIS — E119 Type 2 diabetes mellitus without complications: Secondary | ICD-10-CM

## 2018-02-24 DIAGNOSIS — I129 Hypertensive chronic kidney disease with stage 1 through stage 4 chronic kidney disease, or unspecified chronic kidney disease: Secondary | ICD-10-CM

## 2018-02-24 HISTORY — DX: Essential (primary) hypertension: I10

## 2018-02-24 HISTORY — DX: Hypertensive chronic kidney disease with stage 1 through stage 4 chronic kidney disease, or unspecified chronic kidney disease: I12.9

## 2018-02-24 HISTORY — DX: Chronic kidney disease, stage 4 (severe): N18.4

## 2018-02-24 HISTORY — DX: Atherosclerosis of native arteries of other extremities with ulceration: I70.25

## 2018-02-24 NOTE — Assessment & Plan Note (Signed)
blood glucose control important in reducing the progression of atherosclerotic disease. Also, involved in wound healing. On appropriate medications.  His control is suboptimal.

## 2018-02-24 NOTE — Assessment & Plan Note (Signed)
Poor cardiac function could certainly be contributing to poor blood supply to the feet and toes as well.

## 2018-02-24 NOTE — Assessment & Plan Note (Signed)
blood pressure control important in reducing the progression of atherosclerotic disease. On appropriate oral medications.  

## 2018-02-24 NOTE — Progress Notes (Signed)
Patient ID: Phillip [name], male   DOB: [birth year], 62 y.o.   MRN: [id number]  Chief Complaint  Patient presents with  . New Patient (Initial Visit)    ref Vickki Muff for left toe ulcer    HPI Phillip [name] is a 62 y.o. male.  I am asked to see the patient by Dr. Vickki Muff for evaluation of PAD and ulceration of the lower extremities.  The patient reports several weeks of problems with his left great toe.  This started with an issue in his toenail and some pain.  When he was seen by podiatry this was found to be a full-thickness ulceration and this was debrided down to bone.  He has severe diabetic neuropathy and has chronic pain in his feet that he had related to that.  He does have a history of peripheral arterial disease and we actually saw him about 4 to 5 years ago and performed a right lower extremity revascularization for an issue with his right leg after he was seen by podiatrist at that time.  It does not appears if he has followed up in the last several years.  He does report claudication symptoms in the calf and lower leg with short distances of walking.  He has to sleep in a chair at night and dangle his feet.  They have related this to neuropathy, but it is very possible that perfusion may be playing a role in this as well.  No fevers or chills.  No clear trauma, injury, or inciting event that started the ulceration on the left great toe.   Past Medical History:  Diagnosis Date  . Asthma   . CHF (congestive heart failure) ([place])   . Diabetes mellitus without complication ([place])   . Hypertension    Past Surgical History RLE revascularization 2015  Family History  Problem Relation Age of Onset  . Hyperlipidemia Mother   . Hypertension Mother   . Diabetes Mother   . Heart attack Brother   . Heart disease Brother   no bleeding or clotting disorders  Social History Social History   Tobacco Use  . Smoking status: Never Smoker  . Smokeless tobacco: Never Used  Substance  Use Topics  . Alcohol use: No  . Drug use: No    No Known Allergies  Current Outpatient Medications  Medication Sig Dispense Refill  . amLODipine (NORVASC) 5 MG tablet Take 5 mg by mouth daily.    Marland Kitchen aspirin 81 MG tablet Take 81 mg by mouth daily.    . bevacizumab (AVASTIN) 400 MG/16ML SOLN by Intravitreal route.    . carvedilol (COREG) 6.25 MG tablet Take by mouth.    . cloNIDine (CATAPRES) 0.3 MG tablet Take by mouth.    . doxycycline (VIBRA-TABS) 100 MG tablet Take by mouth.    . furosemide (LASIX) 40 MG tablet Take 40 mg by mouth.    . gabapentin (NEURONTIN) 600 MG tablet Take 600 mg by mouth 3 (three) times daily.    Marland Kitchen lisinopril (PRINIVIL,ZESTRIL) 40 MG tablet TAKE 1 TABLET BY MOUTH DAILY    . lovastatin (MEVACOR) 40 MG tablet Take 40 mg by mouth at bedtime.    . metFORMIN (GLUCOPHAGE) 1000 MG tablet Take 1,000 mg by mouth 2 (two) times daily with a meal.    . metoprolol succinate (TOPROL-XL) 50 MG 24 hr tablet Take 50 mg by mouth daily. Take with or immediately following a meal.    . nitroGLYCERIN (NITROSTAT) 0.4 MG SL  tablet Place under the tongue.    Marland Kitchen omeprazole (PRILOSEC) 20 MG capsule Take 20 mg by mouth daily.    . potassium chloride (KLOR-CON) 20 MEQ packet Take 20 mEq by mouth 2 (two) times daily.    Marland Kitchen spironolactone (ALDACTONE) 25 MG tablet Take 25 mg by mouth daily.  3  . enalapril (VASOTEC) 20 MG tablet Take 20 mg by mouth daily.     No current facility-administered medications for this visit.       REVIEW OF SYSTEMS (Negative unless checked)  Constitutional: [] Weight loss  [] Fever  [] Chills Cardiac: [] Chest pain   [] Chest pressure   [] Palpitations   [] Shortness of breath when laying flat   [] Shortness of breath at rest   [] Shortness of breath with exertion. Vascular:  [x] Pain in legs with walking   [x] Pain in legs at rest   [] Pain in legs when laying flat   [] Claudication   [] Pain in feet when walking  [] Pain in feet at rest  [] Pain in feet when laying flat    [] History of DVT   [] Phlebitis   [] Swelling in legs   [] Varicose veins   [x] Non-healing ulcers Pulmonary:   [] Uses home oxygen   [] Productive cough   [] Hemoptysis   [] Wheeze  [] COPD   [] Asthma Neurologic:  [] Dizziness  [] Blackouts   [] Seizures   [] History of stroke   [] History of TIA  [] Aphasia   [] Temporary blindness   [] Dysphagia   [] Weakness or numbness in arms   [] Weakness or numbness in legs Musculoskeletal:  [] Arthritis   [] Joint swelling   [] Joint pain   [] Low back pain Hematologic:  [] Easy bruising  [] Easy bleeding   [] Hypercoagulable state   [] Anemic  [] Hepatitis Gastrointestinal:  [] Blood in stool   [] Vomiting blood  [] Gastroesophageal reflux/heartburn   [] Abdominal pain Genitourinary:  [] Chronic kidney disease   [] Difficult urination  [] Frequent urination  [] Burning with urination   [] Hematuria Skin:  [] Rashes   [x] Ulcers   [x] Wounds Psychological:  [] History of anxiety   []  History of major depression.    Physical Exam BP (!) 141/70 (BP Location: Right Arm)   Pulse 69   Resp 16   Ht 6' (1.829 m)   Wt 223 lb (101.2 kg)   BMI 30.24 kg/m  Gen:  WD/WN, NAD Head: [place]/AT, No temporalis wasting. Prominent temp pulse not noted. Ear/Nose/Throat: Hearing grossly intact, nares w/o erythema or drainage, oropharynx w/o Erythema/Exudate Eyes: Conjunctiva clear, sclera non-icteric  Neck: trachea midline.   Pulmonary:  Good air movement, respirations not labored, no use of accessory muscles Cardiac: RRR, no JVD Vascular:  Vessel Right Left  Radial Palpable Palpable                          PT  1+ palpable  trace palpable  DP  1+ palpable  not palpable   Gastrointestinal: soft, non-tender/non-distended.  Musculoskeletal: M/S 5/5 throughout.  Left great toe ulceration bandaged today.  No surrounding erythema or drainage on the left foot.  Poor hair formation on both lower legs.  No deformity or atrophy.  No edema. Neurologic: Sensation decreased in extremities.  Symmetrical.  Speech  is fluent. Motor exam as listed above. Psychiatric: Judgment intact, Mood & affect appropriate for pt's clinical situation. Dermatologic: Great toe ulceration, currently dressed today.    Radiology No results found.  Labs No results found for this or any previous visit (from the past 2160 hour(s)).  Assessment/Plan:  Diabetes mellitus without complication ([place])  blood glucose control important in reducing the progression of atherosclerotic disease. Also, involved in wound healing. On appropriate medications.  His control is suboptimal.  Hypertension blood pressure control important in reducing the progression of atherosclerotic disease. On appropriate oral medications.   CHF (congestive heart failure) ([facility]) Poor cardiac function could certainly be contributing to poor blood supply to the feet and toes as well.  Atherosclerosis of native arteries of the extremities with ulceration ([place])  Recommend:  The patient has evidence of severe atherosclerotic changes of both lower extremities associated with ulceration and tissue loss of the foot.  This represents a limb threatening ischemia and places the patient at the risk for limb loss. We have discussed the natural history and pathophysiology of peripheral arterial disease.  4 to 5 years ago, the patient had a similar situation on the right leg proved with revascularization.  He has not had regular follow-up since that time.  He is on aspirin and a statin agent and should continue these. Patient should undergo angiography of the lower extremities with the hope for intervention for limb salvage.  The risks and benefits as well as the alternative therapies was discussed in detail with the patient.  All questions were answered.  Patient agrees to proceed with angiography.  The patient will follow up with me in the office after the procedure.        Leotis Pain 02/24/2018, 1:54 PM   This note was created with Dragon medical transcription  system.  Any errors from dictation are unintentional.

## 2018-02-24 NOTE — Assessment & Plan Note (Signed)
Recommend:  The patient has evidence of severe atherosclerotic changes of both lower extremities associated with ulceration and tissue loss of the foot.  This represents a limb threatening ischemia and places the patient at the risk for limb loss. We have discussed the natural history and pathophysiology of peripheral arterial disease.  4 to 5 years ago, the patient had a similar situation on the right leg proved with revascularization.  He has not had regular follow-up since that time.  He is on aspirin and a statin agent and should continue these. Patient should undergo angiography of the lower extremities with the hope for intervention for limb salvage.  The risks and benefits as well as the alternative therapies was discussed in detail with the patient.  All questions were answered.  Patient agrees to proceed with angiography.  The patient will follow up with me in the office after the procedure.

## 2018-02-24 NOTE — Patient Instructions (Signed)

## 2018-02-25 DIAGNOSIS — Z794 Long term (current) use of insulin: Secondary | ICD-10-CM | POA: Diagnosis not present

## 2018-02-25 DIAGNOSIS — Z87891 Personal history of nicotine dependence: Secondary | ICD-10-CM | POA: Diagnosis not present

## 2018-02-25 DIAGNOSIS — R809 Proteinuria, unspecified: Secondary | ICD-10-CM | POA: Diagnosis not present

## 2018-02-25 DIAGNOSIS — I129 Hypertensive chronic kidney disease with stage 1 through stage 4 chronic kidney disease, or unspecified chronic kidney disease: Secondary | ICD-10-CM | POA: Diagnosis not present

## 2018-02-25 DIAGNOSIS — N183 Chronic kidney disease, stage 3 (moderate): Secondary | ICD-10-CM | POA: Diagnosis not present

## 2018-02-25 DIAGNOSIS — E1122 Type 2 diabetes mellitus with diabetic chronic kidney disease: Secondary | ICD-10-CM | POA: Diagnosis not present

## 2018-02-26 ENCOUNTER — Other Ambulatory Visit (INDEPENDENT_AMBULATORY_CARE_PROVIDER_SITE_OTHER): Payer: Self-pay | Admitting: Nurse Practitioner

## 2018-02-27 ENCOUNTER — Encounter
Admission: RE | Admit: 2018-02-27 | Discharge: 2018-02-27 | Disposition: A | Discharge status: Home / Self Care | Payer: PPO | Source: Ambulatory Visit | Attending: Vascular Surgery | Admitting: Vascular Surgery

## 2018-02-27 ENCOUNTER — Other Ambulatory Visit: Payer: Self-pay

## 2018-02-27 DIAGNOSIS — Z01818 Encounter for other preprocedural examination: Secondary | ICD-10-CM | POA: Diagnosis not present

## 2018-02-27 DIAGNOSIS — I7025 Atherosclerosis of native arteries of other extremities with ulceration: Secondary | ICD-10-CM | POA: Diagnosis not present

## 2018-02-27 HISTORY — DX: Non-pressure chronic ulcer of other part of unspecified foot with unspecified severity: L97.509

## 2018-02-27 HISTORY — DX: Peripheral vascular disease, unspecified: I73.9

## 2018-02-27 HISTORY — DX: Gastro-esophageal reflux disease without esophagitis: K21.9

## 2018-02-27 HISTORY — DX: Disorder of kidney and ureter, unspecified: N28.9

## 2018-02-27 LAB — CREATININE, SERUM
Creatinine, Ser: 2.23 mg/dL — ABNORMAL HIGH (ref 0.61–1.24)
GFR calc Af Amer: 35 mL/min — ABNORMAL LOW (ref >60)
GFR calc non Af Amer: 30 mL/min — ABNORMAL LOW (ref >60)

## 2018-02-27 LAB — BUN: BUN: 72 mg/dL — ABNORMAL HIGH (ref 8–23)

## 2018-03-01 MED ORDER — DEXTROSE 5 % IV SOLN
2.0000 g | Freq: Once | INTRAVENOUS | Status: DC
  Filled 2018-03-01: qty 20

## 2018-03-01 MED ORDER — CEFAZOLIN SODIUM-DEXTROSE 2-4 GM/100ML-% IV SOLN
2.0000 g | Freq: Once | INTRAVENOUS | Status: AC
  Administered 2018-03-02: 2 g via INTRAVENOUS

## 2018-03-02 ENCOUNTER — Ambulatory Visit
Admission: RE | Admit: 2018-03-02 | Discharge: 2018-03-02 | Disposition: A | Discharge status: Home / Self Care | Payer: PPO | Source: Ambulatory Visit | Attending: Vascular Surgery | Admitting: Vascular Surgery

## 2018-03-02 ENCOUNTER — Encounter
Admission: RE | Disposition: A | Discharge status: Home / Self Care | Payer: Self-pay | Source: Ambulatory Visit | Attending: Vascular Surgery

## 2018-03-02 ENCOUNTER — Encounter: Payer: Self-pay | Admitting: *Deleted

## 2018-03-02 DIAGNOSIS — Z833 Family history of diabetes mellitus: Secondary | ICD-10-CM | POA: Insufficient documentation

## 2018-03-02 DIAGNOSIS — L97521 Non-pressure chronic ulcer of other part of left foot limited to breakdown of skin: Secondary | ICD-10-CM | POA: Diagnosis not present

## 2018-03-02 DIAGNOSIS — Z8249 Family history of ischemic heart disease and other diseases of the circulatory system: Secondary | ICD-10-CM | POA: Diagnosis not present

## 2018-03-02 DIAGNOSIS — L97909 Non-pressure chronic ulcer of unspecified part of unspecified lower leg with unspecified severity: Secondary | ICD-10-CM

## 2018-03-02 DIAGNOSIS — Z7982 Long term (current) use of aspirin: Secondary | ICD-10-CM | POA: Insufficient documentation

## 2018-03-02 DIAGNOSIS — E11621 Type 2 diabetes mellitus with foot ulcer: Secondary | ICD-10-CM | POA: Insufficient documentation

## 2018-03-02 DIAGNOSIS — Z9889 Other specified postprocedural states: Secondary | ICD-10-CM | POA: Insufficient documentation

## 2018-03-02 DIAGNOSIS — I70299 Other atherosclerosis of native arteries of extremities, unspecified extremity: Secondary | ICD-10-CM

## 2018-03-02 DIAGNOSIS — I110 Hypertensive heart disease with heart failure: Secondary | ICD-10-CM | POA: Diagnosis not present

## 2018-03-02 DIAGNOSIS — L97526 Non-pressure chronic ulcer of other part of left foot with bone involvement without evidence of necrosis: Secondary | ICD-10-CM

## 2018-03-02 DIAGNOSIS — I509 Heart failure, unspecified: Secondary | ICD-10-CM | POA: Insufficient documentation

## 2018-03-02 DIAGNOSIS — Z7984 Long term (current) use of oral hypoglycemic drugs: Secondary | ICD-10-CM | POA: Insufficient documentation

## 2018-03-02 DIAGNOSIS — I70245 Atherosclerosis of native arteries of left leg with ulceration of other part of foot: Secondary | ICD-10-CM

## 2018-03-02 DIAGNOSIS — Z79899 Other long term (current) drug therapy: Secondary | ICD-10-CM | POA: Insufficient documentation

## 2018-03-02 HISTORY — PX: LOWER EXTREMITY ANGIOGRAPHY: CATH118251

## 2018-03-02 SURGERY — LOWER EXTREMITY ANGIOGRAPHY
Anesthesia: Moderate Sedation | Laterality: Left

## 2018-03-02 MED ORDER — LABETALOL HCL 5 MG/ML IV SOLN: 10.0000 mg | INTRAVENOUS | Status: DC | PRN

## 2018-03-02 MED ORDER — SODIUM CHLORIDE 0.9 % IV SOLN
Freq: Once | INTRAVENOUS | Status: AC
  Administered 2018-03-02: 250 mL via INTRAVENOUS

## 2018-03-02 MED ORDER — CLOPIDOGREL BISULFATE 75 MG PO TABS: 75.0000 mg | ORAL_TABLET | Freq: Every day | ORAL | 11 refills | Status: DC

## 2018-03-02 MED ORDER — MIDAZOLAM HCL 5 MG/5ML IJ SOLN
INTRAMUSCULAR | Status: AC
  Filled 2018-03-02: qty 5

## 2018-03-02 MED ORDER — ONDANSETRON HCL 4 MG/2ML IJ SOLN: 4.0000 mg | Freq: Four times a day (QID) | INTRAMUSCULAR | Status: DC | PRN

## 2018-03-02 MED ORDER — MIDAZOLAM HCL 2 MG/2ML IJ SOLN
INTRAMUSCULAR | Status: DC | PRN
  Administered 2018-03-02: 2 mg via INTRAVENOUS
  Administered 2018-03-02: 1 mg via INTRAVENOUS

## 2018-03-02 MED ORDER — FENTANYL CITRATE (PF) 100 MCG/2ML IJ SOLN
INTRAMUSCULAR | Status: AC
  Filled 2018-03-02: qty 2

## 2018-03-02 MED ORDER — IOPAMIDOL (ISOVUE-300) INJECTION 61%
INTRAVENOUS | Status: DC | PRN
  Administered 2018-03-02: 50 mL via INTRA_ARTERIAL

## 2018-03-02 MED ORDER — CLOPIDOGREL BISULFATE 75 MG PO TABS: 75.0000 mg | ORAL_TABLET | Freq: Every day | ORAL | Status: DC

## 2018-03-02 MED ORDER — SODIUM CHLORIDE 0.9% FLUSH: 3.0000 mL | Freq: Two times a day (BID) | INTRAVENOUS | Status: DC

## 2018-03-02 MED ORDER — HYDROMORPHONE HCL 1 MG/ML IJ SOLN: 1.0000 mg | Freq: Once | INTRAMUSCULAR | Status: DC | PRN

## 2018-03-02 MED ORDER — SODIUM CHLORIDE 0.9% FLUSH: 3.0000 mL | INTRAVENOUS | Status: DC | PRN

## 2018-03-02 MED ORDER — FENTANYL CITRATE (PF) 100 MCG/2ML IJ SOLN
INTRAMUSCULAR | Status: DC | PRN
  Administered 2018-03-02: 25 ug via INTRAVENOUS
  Administered 2018-03-02: 50 ug via INTRAVENOUS
  Administered 2018-03-02: 25 ug via INTRAVENOUS

## 2018-03-02 MED ORDER — HYDRALAZINE HCL 20 MG/ML IJ SOLN: 5.0000 mg | INTRAMUSCULAR | Status: DC | PRN

## 2018-03-02 MED ORDER — LIDOCAINE-EPINEPHRINE (PF) 1 %-1:200000 IJ SOLN
INTRAMUSCULAR | Status: AC
  Filled 2018-03-02: qty 30

## 2018-03-02 MED ORDER — SODIUM CHLORIDE 0.9 % IV SOLN
INTRAVENOUS | Status: DC
  Administered 2018-03-02: 09:00:00 via INTRAVENOUS

## 2018-03-02 MED ORDER — HEPARIN SODIUM (PORCINE) 1000 UNIT/ML IJ SOLN
INTRAMUSCULAR | Status: DC | PRN
  Administered 2018-03-02: 4000 [IU] via INTRAVENOUS

## 2018-03-02 MED ORDER — ACETAMINOPHEN 325 MG PO TABS: 650.0000 mg | ORAL_TABLET | ORAL | Status: DC | PRN

## 2018-03-02 MED ORDER — SODIUM CHLORIDE 0.9 % IV SOLN: INTRAVENOUS | Status: DC

## 2018-03-02 MED ORDER — HEPARIN SODIUM (PORCINE) 1000 UNIT/ML IJ SOLN
INTRAMUSCULAR | Status: AC
  Filled 2018-03-02: qty 1

## 2018-03-02 MED ORDER — HEPARIN (PORCINE) IN NACL 1000-0.9 UT/500ML-% IV SOLN
INTRAVENOUS | Status: AC
  Filled 2018-03-02: qty 1000

## 2018-03-02 MED ORDER — SODIUM CHLORIDE 0.9 % IV SOLN: 250.0000 mL | INTRAVENOUS | Status: DC | PRN

## 2018-03-02 SURGICAL SUPPLY — 19 items
BALLN LUTONIX 018 4X150X130 (BALLOONS) ×3
BALLN STERLING OTW 5X60X135 (BALLOONS) ×3
BALLN ULTRVRSE 2.5X300X150 (BALLOONS) ×3
BALLOON LUTONIX 018 4X150X130 (BALLOONS) IMPLANT
BALLOON STERLING OTW 5X60X135 (BALLOONS) IMPLANT
BALLOON ULTRVRSE 2.5X300X150 (BALLOONS) ×1 IMPLANT
CATH BEACON 5 .038 100 VERT TP (CATHETERS) ×2 IMPLANT
CATH CXI SUPP ANG 4FR 135 (CATHETERS) IMPLANT
CATH CXI SUPP ANG 4FR 135CM (CATHETERS) ×3
CATH PIG 70CM (CATHETERS) ×2 IMPLANT
DEVICE PRESTO INFLATION (MISCELLANEOUS) ×2 IMPLANT
DEVICE STARCLOSE SE CLOSURE (Vascular Products) ×3 IMPLANT
GLIDEWIRE ADV .035X260CM (WIRE) ×2 IMPLANT
PACK ANGIOGRAPHY (CUSTOM PROCEDURE TRAY) ×3 IMPLANT
SHEATH ANL2 6FRX45 HC (SHEATH) ×2 IMPLANT
SHEATH BRITE TIP 5FRX11 (SHEATH) ×2 IMPLANT
TUBING CONTRAST HIGH PRESS 72 (TUBING) ×2 IMPLANT
WIRE G V18X300CM (WIRE) ×2 IMPLANT
WIRE J 3MM .035X145CM (WIRE) ×2 IMPLANT

## 2018-03-02 NOTE — Progress Notes (Signed)
Patient post lower left extremity angiogram per DR Dew, vitals stable, wife with patient. Dr Lucky Cowboy out to speak with patient and wife post procedure with questions answered. Right groin without bleeding nor hematoma.

## 2018-03-02 NOTE — Op Note (Signed)
[facility]  Percutaneous Study/Intervention Procedural Note   Date of Surgery: 03/02/2018  Surgeon(s):DEW,JASON    Assistants:none  Pre-operative Diagnosis: PAD with ulceration left lower extremity  Post-operative diagnosis:  Same  Procedure(s) Performed:             1.  Ultrasound guidance for vascular access right femoral artery             2.  Catheter placement into left posterior tibial artery and left peroneal artery from right femoral approach             3.  Aortogram and selective left lower extremity angiogram             4.  Percutaneous transluminal angioplasty of left posterior tibial artery with a 2.5 mm diameter by 30 cm length angioplasty balloon             5.   Percutaneous transluminal angioplasty of the left tibioperoneal trunk and proximal peroneal artery with a 4 mm diameter by 15 cm length Lutonix drug-coated angioplasty balloon  6.  Percutaneous transluminal angioplasty of the left below-knee popliteal artery with the 4 mm diameter Lutonix drug-coated angioplasty balloon and a 5 mm diameter conventional angioplasty balloon             7.  StarClose closure device right femoral artery  EBL: 5 cc  Contrast: 50 cc  Fluoro Time: 5.9 minutes  Moderate Conscious Sedation Time: approximately 35 minutes using 3 mg of Versed and 100 Mcg of Fentanyl              Indications:  Patient is a 62 y.o.male with a known history of peripheral arterial disease status post intervention to the right leg many years ago.  He now has a nonhealing ulceration to the bone on the left foot/toe. The patient is brought in for angiography for further evaluation and potential treatment.  Due to the limb threatening nature of the situation, angiogram was performed for attempted limb salvage. The patient is aware that if the procedure fails, amputation would be expected.  The patient also understands that even with successful revascularization, amputation may still be  required due to the severity of the situation.  Risks and benefits are discussed and informed consent is obtained.   Procedure:  The patient was identified and appropriate procedural time out was performed.  The patient was then placed supine on the table and prepped and draped in the usual sterile fashion. Moderate conscious sedation was administered during a face to face encounter with the patient throughout the procedure with my supervision of the RN administering medicines and monitoring the patient's vital signs, pulse oximetry, telemetry and mental status throughout from the start of the procedure until the patient was taken to the recovery room. Ultrasound was used to evaluate the right common femoral artery.  It was patent .  A digital ultrasound image was acquired.  A Seldinger needle was used to access the right common femoral artery under direct ultrasound guidance and a permanent image was performed.  A 0.035 J wire was advanced without resistance and a 5Fr sheath was placed.  Pigtail catheter was placed into the aorta and an AP aortogram was performed. This demonstrated normal renal arteries and normal aorta and iliac segments without significant stenosis. I then crossed the aortic bifurcation and advanced to the left femoral head. Selective left lower extremity angiogram was then performed.  The catheter was advanced into the proximal superficial femoral artery  to help opacify distally as his circulation time was quite slow.  This demonstrated normal common femoral artery, profunda femoris artery, and superficial femoral artery to Hunter's canal where there was a mild lesion of only about 20 to 25%.  The popliteal artery was normal above the knee, but the below-knee popliteal artery began to have severe disease couple of centimeters above the anterior tibial artery takeoff.  There was at least a 75 to 80% stenosis in this location.  The anterior tibial artery was patent proximally, but occluded in  the proximal segment without good reconstitution distally.  The tibioperoneal trunk was heavily diseased with a near occlusive stenosis of greater than 90% that continued down into the proximal peroneal artery for greater than 70% stenosis there.  The peroneal artery then normalized after a couple of centimeters and was the best runoff vessel distally.  The posterior tibial artery was small and occluded in the midsegment. It was felt that it was in the patient's best interest to proceed with intervention after these images to avoid a second procedure and a larger amount of contrast and fluoroscopy based off of the findings from the initial angiogram. The patient was systemically heparinized and a 6 [place] Ansell sheath was then placed over the [employer] wire. I then used a Kumpe catheter and the advantage wire to navigate down into the popliteal artery lesion and down into the tibioperoneal trunk.  Initially, the wire went down the posterior tibial artery and so I exchanged for a CXI catheter and was able to get a wire all the way into the foot.  A 2.5 mm diameter by 30 cm length angioplasty balloon was used to treat the posterior tibial artery from the ankle all the way up to the origin of the posterior tibial artery and an trunk.  This was inflated to 8 atm for 1 minute.  I then turned my attention to the peroneal artery which was the dominant runoff distally.  Using the CXI catheter and the advantage wire was able to navigate into the peroneal artery across the tibioperoneal trunk and proximal peroneal artery stenosis and confirm intraluminal flow in the mid peroneal artery.  I then exchanged for a 0.018 wire and proceeded with treatment.  This was a generous vessel.  A 4 mm diameter by 15 cm length Lutonix drug-coated angioplasty balloon was inflated in the proximal peroneal artery and across through the tibioperoneal trunk and into the distal popliteal artery.  This was inflated to 10 atm for 1 minute.   Completion imaging still showed the distal popliteal artery to have a greater than 50% residual stenosis.  The peroneal artery remain the dominant runoff to the foot and the distal tibia peroneal trunk and proximal peroneal artery now had less than 20% stenosis.  The posterior tibial artery remained occluded in the foot and ankle without good reconstitution distally even after angioplasty.  The popliteal artery was then treated with a 5 mm by 6 cm length angioplasty balloon that went just into the proximal tibia peroneal trunk and treated the below-knee popliteal artery in the area of residual lesion.  This was inflated to 8 atm for 1 minute.  Completion imaging showed only about a 20% residual stenosis with mild wall irregularity that was not flow-limiting. I elected to terminate the procedure. The sheath was removed and StarClose closure device was deployed in the right femoral artery with excellent hemostatic result. The patient was taken to the recovery room in stable condition having tolerated  the procedure well.  Findings:               Aortogram:  Normal renal arteries, normal aorta and iliac arteries without significant stenosis             Left lower Extremity:  Normal common femoral artery, profunda femoris artery, and superficial femoral artery to Hunter's canal where there was a mild lesion of only about 20 to 25%.  The popliteal artery was normal above the knee, but the below-knee popliteal artery began to have severe disease couple of centimeters above the anterior tibial artery takeoff.  There was at least a 75 to 80% stenosis in this location.  The anterior tibial artery was patent proximally, but occluded in the proximal segment without good reconstitution distally.  The tibioperoneal trunk was heavily diseased with a near occlusive stenosis of greater than 90% that continued down into the proximal peroneal artery for greater than 70% stenosis there.  The peroneal artery then normalized after a  couple of centimeters and was the best runoff vessel distally.  The posterior tibial artery was small and occluded in the midsegment.   Disposition: Patient was taken to the recovery room in stable condition having tolerated the procedure well.  Complications: None  Leotis Pain 03/02/2018 10:32 AM   This note was created with Dragon Medical transcription system. Any errors in dictation are purely unintentional.

## 2018-03-02 NOTE — H&P (Signed)
[facility] History & Physical Update  The patient was interviewed and re-examined.  The patient's previous History and Physical has been reviewed and is unchanged.  There is no change in the plan of care. We plan to proceed with the scheduled procedure.  Leotis Pain, MD  03/02/2018, 8:13 AM

## 2018-03-02 NOTE — Discharge Instructions (Signed)
°Moderate Conscious Sedation, Adult, Care After °These instructions provide you with information about caring for yourself after your procedure. Your health care provider may also give you more specific instructions. Your treatment has been planned according to current medical practices, but problems sometimes occur. Call your health care provider if you have any problems or questions after your procedure. °What can I expect after the procedure? °After your procedure, it is common: °· To feel sleepy for several hours. °· To feel clumsy and have poor balance for several hours. °· To have poor judgment for several hours. °· To vomit if you eat too soon. ° °Follow these instructions at home: °For at least 24 hours after the procedure: ° °· Do not: °? Participate in activities where you could fall or become injured. °? Drive. °? Use heavy machinery. °? Drink alcohol. °? Take sleeping pills or medicines that cause drowsiness. °? Make important decisions or sign legal documents. °? Take care of children on your own. °· Rest. °Eating and drinking °· Follow the diet recommended by your health care provider. °· If you vomit: °? Drink water, juice, or soup when you can drink without vomiting. °? Make sure you have little or no nausea before eating solid foods. °General instructions °· Have a responsible adult stay with you until you are awake and alert. °· Take over-the-counter and prescription medicines only as told by your health care provider. °· If you smoke, do not smoke without supervision. °· Keep all follow-up visits as told by your health care provider. This is important. °Contact a health care provider if: °· You keep feeling nauseous or you keep vomiting. °· You feel light-headed. °· You develop a rash. °· You have a fever. °Get help right away if: °· You have trouble breathing. °This information is not intended to replace advice given to you by your health care provider. Make sure you discuss any questions you  have with your health care provider. °Document Released: 03/10/2013 Document Revised: 10/23/2015 Document Reviewed: 09/09/2015 °Elsevier Interactive Patient Education © 2018 Else[employer] ° ° ° °Femoral Site Care °Refer to this sheet in the next few weeks. These instructions provide you with information about caring for yourself after your procedure. Your health care provider may also give you more specific instructions. Your treatment has been planned according to current medical practices, but problems sometimes occur. Call your health care provider if you have any problems or questions after your procedure. °What can I expect after the procedure? °After your procedure, it is typical to have the following: °· Bruising at the site that usually fades within 1-2 weeks. °· Blood collecting in the tissue (hematoma) that may be painful to the touch. It should usually decrease in size and tenderness within 1-2 weeks. ° °Follow these instructions at home: °· Take medicines only as directed by your health care provider. °· You may shower 24-48 hours after the procedure or as directed by your health care provider. Remove the bandage (dressing) and gently wash the site with plain soap and water. Pat the area dry with a clean towel. Do not rub the site, because this may cause bleeding. °· Do not take baths, swim, or use a hot tub until your health care provider approves. °· Check your insertion site every day for redness, swelling, or drainage. °· Do not apply powder or lotion to the site. °· Limit use of stairs to twice a day for the first 2-3 days or as directed by your health care   provider. °· Do not squat for the first 2-3 days or as directed by your health care provider. °· Do not lift over 10 lb (4.5 kg) for 5 days after your procedure or as directed by your health care provider. °· Ask your health care provider when it is okay to: °? Return to work or school. °? Resume usual physical activities or sports. °? Resume  sexual activity. °· Do not drive home if you are discharged the same day as the procedure. Have someone else drive you. °· You may drive 24 hours after the procedure unless otherwise instructed by your health care provider. °· Do not operate machinery or power tools for 24 hours after the procedure or as directed by your health care provider. °· If your procedure was done as an outpatient procedure, which means that you went home the same day as your procedure, a responsible adult should be with you for the first 24 hours after you arrive home. °· Keep all follow-up visits as directed by your health care provider. This is important. °Contact a health care provider if: °· You have a fever. °· You have chills. °· You have increased bleeding from the site. Hold pressure on the site. °Get help right away if: °· You have unusual pain at the site. °· You have redness, warmth, or swelling at the site. °· You have drainage (other than a small amount of blood on the dressing) from the site. °· The site is bleeding, and the bleeding does not stop after 30 minutes of holding steady pressure on the site. °· Your leg or foot becomes pale, cool, tingly, or numb. °This information is not intended to replace advice given to you by your health care provider. Make sure you discuss any questions you have with your health care provider. °Document Released: 01/21/2014 Document Revised: 10/26/2015 Document Reviewed: 12/07/2013 °Elsevier Interactive Patient Education © 2018 Elsevier Inc. ° °

## 2018-03-02 NOTE — OR Nursing (Signed)
[name] to inform Dr dew that pt GFR 30. Pt informed by nursing staff that angiogram has increased risk of additional kidney disfunciton. Pt reports he is concerned about his kidneys but want to proceed with lower extremity angiogram due to need for pain relief.

## 2018-03-03 ENCOUNTER — Encounter: Payer: Self-pay | Admitting: Vascular Surgery

## 2018-03-03 DIAGNOSIS — M86172 Other acute osteomyelitis, left ankle and foot: Secondary | ICD-10-CM | POA: Diagnosis not present

## 2018-03-03 DIAGNOSIS — E1152 Type 2 diabetes mellitus with diabetic peripheral angiopathy with gangrene: Secondary | ICD-10-CM | POA: Diagnosis not present

## 2018-03-03 DIAGNOSIS — L97522 Non-pressure chronic ulcer of other part of left foot with fat layer exposed: Secondary | ICD-10-CM | POA: Diagnosis not present

## 2018-03-03 DIAGNOSIS — E1142 Type 2 diabetes mellitus with diabetic polyneuropathy: Secondary | ICD-10-CM | POA: Diagnosis not present

## 2018-03-05 DIAGNOSIS — N183 Chronic kidney disease, stage 3 (moderate): Secondary | ICD-10-CM | POA: Diagnosis not present

## 2018-03-09 ENCOUNTER — Telehealth (INDEPENDENT_AMBULATORY_CARE_PROVIDER_SITE_OTHER): Payer: Self-pay

## 2018-03-09 NOTE — Telephone Encounter (Signed)
Patient's wife called and stated that he is having lots of pain, and hurting since his procedure last week. States that the patient is now walking with a can and he didn't have to use the cane the last time that he had the procedure done.  She would like to know if this is normal, or should they be worried, since he is scheduled for a toe amputation on this Friday?

## 2018-03-09 NOTE — Telephone Encounter (Signed)
done

## 2018-03-10 ENCOUNTER — Other Ambulatory Visit: Payer: Self-pay | Admitting: Podiatry

## 2018-03-10 DIAGNOSIS — E1152 Type 2 diabetes mellitus with diabetic peripheral angiopathy with gangrene: Secondary | ICD-10-CM | POA: Diagnosis not present

## 2018-03-10 DIAGNOSIS — I96 Gangrene, not elsewhere classified: Secondary | ICD-10-CM | POA: Diagnosis not present

## 2018-03-10 DIAGNOSIS — L97522 Non-pressure chronic ulcer of other part of left foot with fat layer exposed: Secondary | ICD-10-CM | POA: Diagnosis not present

## 2018-03-11 ENCOUNTER — Ambulatory Visit (INDEPENDENT_AMBULATORY_CARE_PROVIDER_SITE_OTHER): Payer: PPO

## 2018-03-11 ENCOUNTER — Ambulatory Visit (INDEPENDENT_AMBULATORY_CARE_PROVIDER_SITE_OTHER): Payer: PPO | Admitting: Nurse Practitioner

## 2018-03-11 ENCOUNTER — Encounter (INDEPENDENT_AMBULATORY_CARE_PROVIDER_SITE_OTHER): Payer: Self-pay | Admitting: Nurse Practitioner

## 2018-03-11 ENCOUNTER — Other Ambulatory Visit: Payer: Self-pay

## 2018-03-11 ENCOUNTER — Telehealth (INDEPENDENT_AMBULATORY_CARE_PROVIDER_SITE_OTHER): Payer: Self-pay | Admitting: Nurse Practitioner

## 2018-03-11 ENCOUNTER — Other Ambulatory Visit (INDEPENDENT_AMBULATORY_CARE_PROVIDER_SITE_OTHER): Payer: Self-pay | Admitting: Vascular Surgery

## 2018-03-11 ENCOUNTER — Encounter
Admission: RE | Admit: 2018-03-11 | Discharge: 2018-03-11 | Disposition: A | Discharge status: Home / Self Care | Payer: PPO | Source: Ambulatory Visit | Attending: Podiatry | Admitting: Podiatry

## 2018-03-11 VITALS — BP 167/76 | HR 72 | Resp 16 | Ht 72.0 in | Wt 214.0 lb

## 2018-03-11 DIAGNOSIS — Z87891 Personal history of nicotine dependence: Secondary | ICD-10-CM

## 2018-03-11 DIAGNOSIS — E782 Mixed hyperlipidemia: Secondary | ICD-10-CM | POA: Diagnosis not present

## 2018-03-11 DIAGNOSIS — I10 Essential (primary) hypertension: Secondary | ICD-10-CM | POA: Diagnosis not present

## 2018-03-11 DIAGNOSIS — Z9862 Peripheral vascular angioplasty status: Secondary | ICD-10-CM

## 2018-03-11 DIAGNOSIS — I7025 Atherosclerosis of native arteries of other extremities with ulceration: Secondary | ICD-10-CM

## 2018-03-11 DIAGNOSIS — Z7689 Persons encountering health services in other specified circumstances: Secondary | ICD-10-CM | POA: Diagnosis not present

## 2018-03-11 DIAGNOSIS — Z01818 Encounter for other preprocedural examination: Secondary | ICD-10-CM | POA: Diagnosis not present

## 2018-03-11 DIAGNOSIS — I25710 Atherosclerosis of autologous vein coronary artery bypass graft(s) with unstable angina pectoris: Secondary | ICD-10-CM | POA: Diagnosis not present

## 2018-03-11 DIAGNOSIS — I70249 Atherosclerosis of native arteries of left leg with ulceration of unspecified site: Secondary | ICD-10-CM

## 2018-03-11 DIAGNOSIS — I2510 Atherosclerotic heart disease of native coronary artery without angina pectoris: Secondary | ICD-10-CM | POA: Insufficient documentation

## 2018-03-11 DIAGNOSIS — I509 Heart failure, unspecified: Secondary | ICD-10-CM | POA: Diagnosis not present

## 2018-03-11 DIAGNOSIS — R011 Cardiac murmur, unspecified: Secondary | ICD-10-CM | POA: Diagnosis not present

## 2018-03-11 DIAGNOSIS — E1151 Type 2 diabetes mellitus with diabetic peripheral angiopathy without gangrene: Secondary | ICD-10-CM | POA: Diagnosis not present

## 2018-03-11 DIAGNOSIS — R0609 Other forms of dyspnea: Secondary | ICD-10-CM | POA: Diagnosis not present

## 2018-03-11 DIAGNOSIS — Z01812 Encounter for preprocedural laboratory examination: Secondary | ICD-10-CM | POA: Diagnosis not present

## 2018-03-11 DIAGNOSIS — L03116 Cellulitis of left lower limb: Secondary | ICD-10-CM | POA: Diagnosis not present

## 2018-03-11 DIAGNOSIS — I5023 Acute on chronic systolic (congestive) heart failure: Secondary | ICD-10-CM | POA: Diagnosis not present

## 2018-03-11 DIAGNOSIS — I739 Peripheral vascular disease, unspecified: Secondary | ICD-10-CM | POA: Diagnosis not present

## 2018-03-11 DIAGNOSIS — K219 Gastro-esophageal reflux disease without esophagitis: Secondary | ICD-10-CM | POA: Diagnosis not present

## 2018-03-11 HISTORY — DX: Atherosclerotic heart disease of native coronary artery without angina pectoris: I25.10

## 2018-03-11 HISTORY — DX: Cardiac murmur, unspecified: R01.1

## 2018-03-11 LAB — PROTIME-INR
INR: 1.13
PROTHROMBIN TIME: 14.4 s (ref 11.4–15.2)

## 2018-03-11 MED ORDER — CLINDAMYCIN HCL 150 MG PO CAPS: 300.0000 mg | ORAL_CAPSULE | Freq: Four times a day (QID) | ORAL | Status: AC

## 2018-03-11 NOTE — Patient Instructions (Addendum)
  Your procedure is scheduled on: Friday March 13, 2018  Report to Same Day Surgery 2nd floor [facility] Entrance-take elevator on left to 2nd floor.  Check in with surgery information desk.) To find out your arrival time, call [phone] 1:00-3:00 PM on Thursday March 12, 2018  Remember: Instructions that are not followed completely may result in serious medical risk, up to and including death, or upon the discretion of your surgeon and anesthesiologist your surgery may need to be rescheduled.    __x__ 1. Do not eat food (including mints, candies, chewing gum) after midnight the night before your procedure. You may drink water up to 2 hours before you are scheduled to arrive at the hospital for your procedure.  Do not drink anything within 2 hours of your scheduled arrival to the hospital.    __x__ 2. No Alcohol for 24 hours before or after surgery.   __x__ 3. No Smoking or e-cigarettes for 24 hours before surgery.  Do not use any chewable tobacco products for at least 6 hours before surgery.   __x__ 4. Notify your doctor if there is any change in your medical condition (cold, fever, infections).   __x__ 5. On the morning of surgery brush your teeth with toothpaste and water.  You may rinse your mouth with mouthwash if you wish.  Do not swallow any toothpaste or mouthwash.  Please read over the following fact sheets that you were given:   [facility] Preparing for Surgery and/or MRSA Information    __x__ Use CHG Soap or Sage wipes as directed on instruction sheet.   Do not wear jewelry.  Do not wear lotions, powders, deodorant, or perfumes.   Do not bring valuables to the hospital.    [facility] is not responsible for any belongings or valuables.               Contacts, dentures or bridgework may not be worn into surgery.  For patients discharged on the day of surgery, you will NOT be permitted to drive yourself home.  You must have a responsible adult with you  for 24 hours after surgery.       __x__ Take anti-hypertensive listed below, cardiac, seizure, asthma, anti-reflux and psychiatric medicines. These include:  1. Amlodipine (Norvasc)  2. Carvedilol (Coreg)  3. Clonidine (Catapres)  4. Gabapentin (Neurontin)  5. Isosorbide (Imdur)   __x__ Take 1/2 of usual insulin dose Thursday night (Only take 40 units Levemir/Detemir)    __x__ Follow recommendations from Cardiologist, Pulmonologist or PCP regarding stopping Plavix/Clopidogrel, Aspirin, Coumadin, Eliquis, Effient, Pradaxa, and Pletal.  __x__ Stop Anti-inflammatories such as Advil, Ibuprofen, Motrin, Aleve, Naproxen, Naprosyn, BC/Goodies powders or aspirin products. You may continue to take Tylenol and Celebrex.   __x__ Stop supplements until after surgery. You may continue to take Vitamin D, Vitamin B, and multivitamin.

## 2018-03-11 NOTE — Telephone Encounter (Signed)
This spoke with patient regarding plan of care.  I confirmed and verified his pharmacy and spoke with him about taking clindamycin 300 mg 4 times daily for 10 days.  We will follow-up with him in the office following.

## 2018-03-12 ENCOUNTER — Encounter (INDEPENDENT_AMBULATORY_CARE_PROVIDER_SITE_OTHER): Payer: Self-pay | Admitting: Nurse Practitioner

## 2018-03-12 NOTE — Pre-Procedure Instructions (Signed)
OFFICE STATES DR CALLWOOD HAS CLEARED PATIENT. WAS REQUESTED BY DR Alvera Singh OFFICE 03/10/18. NOTE NOT FINISHED. FAXED REQUEST FOR CLEARANCE SHEET TO DR CALLWOOD TO SIGN.

## 2018-03-12 NOTE — Progress Notes (Signed)
Subjective:    Patient ID: Phillip [name], male    DOB: [birth year], 62 y.o.   MRN: [id number] Chief Complaint  Patient presents with  . Follow-up    abi results    HPI  Phillip [name] is a 62 y.o. male being seen on request of his podiatrist.  Phillip [name] has a great toe amputation scheduled for 03/13/2018.  Currently his left foot is giving him extended amounts of pain which has been hurting for the last week or so and recently it began to be red in color.  The wound is extremely painful on palpation.  The area is very erythematous and warm, there is also a small blister in the area as well.  The patient states that despite being on doxycycline for his toe, this area of redness and pain occurred.  The patient also underwent bilateral ABIs and duplex today which revealed an ABI of 1.27 on his right, with a noncompressible ABI of his left.  He has dampened biphasic flows on his left lower extremity with triphasic and biphasic flows in his right lower extremity.  The waveforms on the left digits were also dampened throughout.  Normal digit waveforms were exhibited on the right lower extremity.  Review of Systems   Review of Systems: Negative Unless Checked Constitutional: [] Weight loss  [] Fever  [] Chills Cardiac: [] Chest pain   ? Atrial Fibrillation  [] Palpitations   [] Shortness of breath when laying flat   [] Shortness of breath with exertion. Vascular:  [] Pain in legs with walking   [] Pain in legs with standing  [] History of DVT   [] Phlebitis   [] Swelling in legs   [] Varicose veins   [x] Non-healing ulcers Pulmonary:   [] Uses home oxygen   [] Productive cough   [] Hemoptysis   [] Wheeze  [] COPD   [] Asthma Neurologic:  [] Dizziness   [] Seizures   [] History of stroke   [] History of TIA  [] Aphasia   [] Vissual changes   [] Weakness or numbness in arm   [x] Weakness or numbness in leg Musculoskeletal:   [] Joint swelling   [] Joint pain   [] Low back pain  ? History of Knee Replacement Hematologic:   [] Easy bruising  [] Easy bleeding   [] Hypercoagulable state   [] Anemic Gastrointestinal:  [] Diarrhea   [] Vomiting  [] Gastroesophageal reflux/heartburn   [] Difficulty swallowing. Genitourinary:  [] Chronic kidney disease   [] Difficult urination  [] Anuric   [] Blood in urine Skin:  [] Rashes   [x] Ulcers  Psychological:  [] History of anxiety   []  History of major depression  ? Memory Difficulties     Objective:   Physical Exam  BP (!) 167/76 (BP Location: Right Arm)   Pulse 72   Resp 16   Ht 6' (1.829 m)   Wt 214 lb (97.1 kg)   BMI 29.02 kg/m   Past Medical History:  Diagnosis Date  . Asthma   . CHF (congestive heart failure) ([place])   . Coronary artery disease   . Diabetes mellitus without complication ([place])   . Foot ulcer ([place])    left foot  . GERD (gastroesophageal reflux disease)   . Heart murmur   . Hypertension   . Kidney insufficiency   . Peripheral vascular disease ([place])      Gen: WD/WN, NAD, hard to understand Head: [place]/AT, No temporalis wasting.  Ear/Nose/Throat: Hearing grossly intact, nares w/o erythema or drainage Eyes: PER, EOMI, sclera nonicteric.  Neck: Supple, no masses.  No JVD.  Pulmonary:  Good air movement, no use of accessory muscles.  Cardiac: RRR Vascular:  Vessel Right Left  Dorsalis Pedis 1+Palpable Trace Palpable  Posterior Tibial 1+Palpable Unable to palpate d/t pain   Gastrointestinal: soft, non-distended. No guarding/no peritoneal signs.  Musculoskeletal: M/S 5/5 throughout.  Neurologic: Pain and light touch intact in extremities.  Symmetrical.  Speech is fluent. Motor exam as listed above. Psychiatric: Judgment intact, Mood & affect appropriate for pt's clinical situation. Dermatologic: Great toe ulceration.  Currently dressed.  Skin is red, warm, very painful to the touch on Left foot.  Small blister, possibly from irritation of boot.  Lymph : No Cervical lymphadenopathy, no lichenification or skin changes of chronic lymphedema.   Social  History   Socioeconomic History  . Marital status: Married    Spouse name: Not on file  . Number of children: Not on file  . Years of education: Not on file  . Highest education level: Not on file  Occupational History  . Not on file  Social Needs  . Financial resource strain: Not on file  . Food insecurity:    Worry: Not on file    Inability: Not on file  . Transportation needs:    Medical: Not on file    Non-medical: Not on file  Tobacco Use  . Smoking status: Former Smoker    Last attempt to quit: 2003    Years since quitting: 16.7  . Smokeless tobacco: Never Used  Substance and Sexual Activity  . Alcohol use: Not Currently    Comment: 2003  . Drug use: Not Currently    Types: Marijuana  . Sexual activity: Not on file  Lifestyle  . Physical activity:    Days per week: Not on file    Minutes per session: Not on file  . Stress: Not on file  Relationships  . Social connections:    Talks on phone: Not on file    Gets together: Not on file    Attends religious service: Not on file    Active member of club or organization: Not on file    Attends meetings of clubs or organizations: Not on file    Relationship status: Not on file  . Intimate partner violence:    Fear of current or ex partner: Not on file    Emotionally abused: Not on file    Physically abused: Not on file    Forced sexual activity: Not on file  Other Topics Concern  . Not on file  Social History Narrative  . Not on file    Past Surgical History:  Procedure Laterality Date  . ANGIOPLASTY    . CARDIAC CATHETERIZATION    . CARDIAC SURGERY     heart bypass (4)  . CORONARY ARTERY BYPASS GRAFT    . LOWER EXTREMITY ANGIOGRAPHY Left 03/02/2018   Procedure: LOWER EXTREMITY ANGIOGRAPHY;  Surgeon: Algernon Huxley, MD;  Location: [facility];  Service: Cardiovascular;  Laterality: Left;  . ROTATOR CUFF REPAIR Left     Family History  Problem Relation Age of Onset  . Hyperlipidemia Mother   .  Hypertension Mother   . Diabetes Mother   . Heart attack Brother   . Heart disease Brother     No Known Allergies     Assessment & Plan:   1. Atherosclerosis of native arteries of the extremities with ulceration ([place]) The patient also underwent bilateral ABIs and duplex today which revealed an ABI of 1.27 on his right, with a noncompressible ABI of his left.  He has dampened  biphasic flows on his left lower extremity with triphasic and biphasic flows in his right lower extremity.  The waveforms on the left digits were also dampened throughout.  Normal digit waveforms were exhibited on the right lower extremity.  Consulted with Dr. Lucky Cowboy, and based on the presentation the patient should proceed with great toe amputation as scheduled.  Based upon the ABIs done today as well as the duplex done the patient should have adequate blood flow to allow for  wound healing.  Delaying amputation could potentially be more harmful than proceeding.  2. Essential hypertension Continue antihypertensive medications as already ordered, these medications have been reviewed and there are no changes at this time.   3. Chronic congestive heart failure, unspecified heart failure type ([place]) Currently on appropriate medication.  No signs and symptoms of acute exacerbation.  We will continue to follow cardiology service.  4. Cellulitis of left lower extremity Based upon the presentation, this appears to be a case of cellulitis.  We will place patient on clindamycin for 10 days to allow for healing of the cellulitis, as well as to allow for amputation of the toe which could be a factor of infection as well. - clindamycin (CLEOCIN) capsule 300 mg   Current Outpatient Medications on File Prior to Visit  Medication Sig Dispense Refill  . acetaminophen (TYLENOL) 500 MG tablet Take 1,000 mg by mouth every 6 (six) hours as needed for moderate pain or headache.     Marland Kitchen amLODipine (NORVASC) 5 MG tablet Take 5 mg by mouth  daily.    Marland Kitchen aspirin 81 MG tablet Take 81 mg by mouth daily.    . carvedilol (COREG) 6.25 MG tablet Take 6.25 mg by mouth 2 (two) times daily with a meal.     . cloNIDine (CATAPRES) 0.3 MG tablet Take 0.3 mg by mouth 2 (two) times daily.     . clopidogrel (PLAVIX) 75 MG tablet Take 1 tablet (75 mg total) by mouth daily. 30 tablet 11  . doxycycline (VIBRAMYCIN) 100 MG capsule Take 100 mg by mouth 2 (two) times daily.    . furosemide (LASIX) 40 MG tablet Take 40 mg by mouth 2 (two) times daily.     Marland Kitchen gabapentin (NEURONTIN) 600 MG tablet Take 600 mg by mouth 2 (two) times daily.     Marland Kitchen glipiZIDE (GLUCOTROL XL) 10 MG 24 hr tablet Take 10 mg by mouth daily with breakfast.    . insulin detemir (LEVEMIR) 100 UNIT/ML injection Inject 80 Units into the skin at bedtime.    . isosorbide mononitrate (IMDUR) 120 MG 24 hr tablet Take 120 mg by mouth daily.    Marland Kitchen lisinopril (PRINIVIL,ZESTRIL) 40 MG tablet Take 40 mg by mouth daily.     Marland Kitchen lovastatin (MEVACOR) 40 MG tablet Take 40 mg by mouth at bedtime.    . nitroGLYCERIN (NITROSTAT) 0.4 MG SL tablet Place 0.4 mg under the tongue every 5 (five) minutes as needed for chest pain.     . potassium chloride (KLOR-CON) 20 MEQ packet Take 20 mEq by mouth 2 (two) times daily.    Marland Kitchen spironolactone (ALDACTONE) 25 MG tablet Take 25 mg by mouth daily.  3  . vitamin B-12 (CYANOCOBALAMIN) 500 MCG tablet Take 500 mcg by mouth daily.     No current facility-administered medications on file prior to visit.     There are no Patient Instructions on file for this visit. No follow-ups on file.   Kris Hartmann, NP

## 2018-03-13 ENCOUNTER — Ambulatory Visit: Payer: PPO | Admitting: Certified Registered Nurse Anesthetist

## 2018-03-13 ENCOUNTER — Encounter: Payer: Self-pay | Admitting: Emergency Medicine

## 2018-03-13 ENCOUNTER — Ambulatory Visit
Admission: RE | Admit: 2018-03-13 | Discharge: 2018-03-13 | Disposition: A | Discharge status: Home / Self Care | Payer: PPO | Source: Ambulatory Visit | Attending: Podiatry | Admitting: Podiatry

## 2018-03-13 ENCOUNTER — Encounter
Admission: RE | Disposition: A | Discharge status: Home / Self Care | Payer: Self-pay | Source: Ambulatory Visit | Attending: Podiatry

## 2018-03-13 ENCOUNTER — Other Ambulatory Visit: Payer: Self-pay

## 2018-03-13 DIAGNOSIS — E1152 Type 2 diabetes mellitus with diabetic peripheral angiopathy with gangrene: Secondary | ICD-10-CM | POA: Insufficient documentation

## 2018-03-13 DIAGNOSIS — Z87891 Personal history of nicotine dependence: Secondary | ICD-10-CM | POA: Insufficient documentation

## 2018-03-13 DIAGNOSIS — E785 Hyperlipidemia, unspecified: Secondary | ICD-10-CM | POA: Insufficient documentation

## 2018-03-13 DIAGNOSIS — Z7982 Long term (current) use of aspirin: Secondary | ICD-10-CM | POA: Insufficient documentation

## 2018-03-13 DIAGNOSIS — Z7984 Long term (current) use of oral hypoglycemic drugs: Secondary | ICD-10-CM | POA: Diagnosis not present

## 2018-03-13 DIAGNOSIS — L97522 Non-pressure chronic ulcer of other part of left foot with fat layer exposed: Secondary | ICD-10-CM | POA: Insufficient documentation

## 2018-03-13 DIAGNOSIS — Z79899 Other long term (current) drug therapy: Secondary | ICD-10-CM | POA: Insufficient documentation

## 2018-03-13 DIAGNOSIS — K219 Gastro-esophageal reflux disease without esophagitis: Secondary | ICD-10-CM | POA: Diagnosis not present

## 2018-03-13 DIAGNOSIS — M86179 Other acute osteomyelitis, unspecified ankle and foot: Secondary | ICD-10-CM | POA: Insufficient documentation

## 2018-03-13 DIAGNOSIS — I2510 Atherosclerotic heart disease of native coronary artery without angina pectoris: Secondary | ICD-10-CM | POA: Diagnosis not present

## 2018-03-13 DIAGNOSIS — E11621 Type 2 diabetes mellitus with foot ulcer: Secondary | ICD-10-CM | POA: Diagnosis not present

## 2018-03-13 DIAGNOSIS — I96 Gangrene, not elsewhere classified: Secondary | ICD-10-CM | POA: Diagnosis not present

## 2018-03-13 DIAGNOSIS — I70262 Atherosclerosis of native arteries of extremities with gangrene, left leg: Secondary | ICD-10-CM | POA: Diagnosis not present

## 2018-03-13 DIAGNOSIS — I110 Hypertensive heart disease with heart failure: Secondary | ICD-10-CM | POA: Insufficient documentation

## 2018-03-13 DIAGNOSIS — I509 Heart failure, unspecified: Secondary | ICD-10-CM | POA: Diagnosis not present

## 2018-03-13 DIAGNOSIS — M86172 Other acute osteomyelitis, left ankle and foot: Secondary | ICD-10-CM | POA: Diagnosis not present

## 2018-03-13 DIAGNOSIS — E039 Hypothyroidism, unspecified: Secondary | ICD-10-CM | POA: Diagnosis not present

## 2018-03-13 HISTORY — PX: AMPUTATION TOE: SHX6595

## 2018-03-13 LAB — GLUCOSE, CAPILLARY
Glucose-Capillary: 47 mg/dL — ABNORMAL LOW (ref 70–99)
Glucose-Capillary: 104 mg/dL — ABNORMAL HIGH (ref 70–99)
Glucose-Capillary: 60 mg/dL — ABNORMAL LOW (ref 70–99)
Glucose-Capillary: 77 mg/dL (ref 70–99)
Glucose-Capillary: 115 mg/dL — ABNORMAL HIGH (ref 70–99)

## 2018-03-13 LAB — URINE DRUG SCREEN, QUALITATIVE (ARMC ONLY)
Amphetamines, Ur Screen: NOT DETECTED
Barbiturates, Ur Screen: NOT DETECTED
Benzodiazepine, Ur Scrn: NOT DETECTED
COCAINE METABOLITE, UR ~~LOC~~: NOT DETECTED
Cannabinoid 50 Ng, Ur ~~LOC~~: NOT DETECTED
MDMA (ECSTASY) UR SCREEN: NOT DETECTED
Methadone Scn, Ur: NOT DETECTED
OPIATE, UR SCREEN: NOT DETECTED
PHENCYCLIDINE (PCP) UR S: NOT DETECTED
Tricyclic, Ur Screen: NOT DETECTED

## 2018-03-13 SURGERY — AMPUTATION, TOE
Anesthesia: General | Site: Toe | Laterality: Left

## 2018-03-13 MED ORDER — PROPOFOL 10 MG/ML IV BOLUS
INTRAVENOUS | Status: DC | PRN
  Administered 2018-03-13: 180 mg via INTRAVENOUS

## 2018-03-13 MED ORDER — LIDOCAINE HCL (PF) 2 % IJ SOLN
INTRAMUSCULAR | Status: AC
  Filled 2018-03-13: qty 10

## 2018-03-13 MED ORDER — ONDANSETRON HCL 4 MG/2ML IJ SOLN
INTRAMUSCULAR | Status: AC
  Filled 2018-03-13: qty 2

## 2018-03-13 MED ORDER — CEFAZOLIN SODIUM-DEXTROSE 2-4 GM/100ML-% IV SOLN
INTRAVENOUS | Status: AC
  Filled 2018-03-13: qty 100

## 2018-03-13 MED ORDER — DEXAMETHASONE SODIUM PHOSPHATE 10 MG/ML IJ SOLN
INTRAMUSCULAR | Status: AC
  Filled 2018-03-13: qty 1

## 2018-03-13 MED ORDER — ONDANSETRON HCL 4 MG/2ML IJ SOLN: 4.0000 mg | Freq: Four times a day (QID) | INTRAMUSCULAR | Status: DC | PRN

## 2018-03-13 MED ORDER — PROMETHAZINE HCL 25 MG/ML IJ SOLN: 6.2500 mg | INTRAMUSCULAR | Status: DC | PRN

## 2018-03-13 MED ORDER — PROPOFOL 10 MG/ML IV BOLUS
INTRAVENOUS | Status: AC
  Filled 2018-03-13: qty 20

## 2018-03-13 MED ORDER — SODIUM CHLORIDE 0.9 % IV SOLN
INTRAVENOUS | Status: DC
  Administered 2018-03-13: 10:00:00 via INTRAVENOUS

## 2018-03-13 MED ORDER — BUPIVACAINE-EPINEPHRINE (PF) 0.25% -1:200000 IJ SOLN
INTRAMUSCULAR | Status: AC
  Filled 2018-03-13: qty 30

## 2018-03-13 MED ORDER — DEXTROSE 50 % IV SOLN
INTRAVENOUS | Status: AC
  Administered 2018-03-13: 25 mL via INTRAVENOUS
  Filled 2018-03-13: qty 50

## 2018-03-13 MED ORDER — SUGAMMADEX SODIUM 500 MG/5ML IV SOLN
INTRAVENOUS | Status: AC
  Filled 2018-03-13: qty 5

## 2018-03-13 MED ORDER — ONDANSETRON HCL 4 MG/2ML IJ SOLN
INTRAMUSCULAR | Status: DC | PRN
  Administered 2018-03-13: 4 mg via INTRAVENOUS

## 2018-03-13 MED ORDER — FENTANYL CITRATE (PF) 100 MCG/2ML IJ SOLN: 25.0000 ug | INTRAMUSCULAR | Status: DC | PRN

## 2018-03-13 MED ORDER — DOXYCYCLINE HYCLATE 100 MG PO TABS: 100.0000 mg | ORAL_TABLET | Freq: Two times a day (BID) | ORAL | 1 refills | Status: DC

## 2018-03-13 MED ORDER — BUPIVACAINE HCL 0.5 % IJ SOLN
INTRAMUSCULAR | Status: DC | PRN
  Administered 2018-03-13: 30 mL

## 2018-03-13 MED ORDER — LIDOCAINE HCL (PF) 1 % IJ SOLN
INTRAMUSCULAR | Status: AC
  Filled 2018-03-13: qty 30

## 2018-03-13 MED ORDER — MIDAZOLAM HCL 2 MG/2ML IJ SOLN
INTRAMUSCULAR | Status: DC | PRN
  Administered 2018-03-13: 1 mg via INTRAVENOUS

## 2018-03-13 MED ORDER — FENTANYL CITRATE (PF) 100 MCG/2ML IJ SOLN
INTRAMUSCULAR | Status: DC | PRN
  Administered 2018-03-13: 50 ug via INTRAVENOUS

## 2018-03-13 MED ORDER — DEXTROSE 50 % IV SOLN
25.0000 mL | Freq: Once | INTRAVENOUS | Status: AC
  Administered 2018-03-13: 25 mL via INTRAVENOUS

## 2018-03-13 MED ORDER — LACTATED RINGERS IV SOLN: INTRAVENOUS | Status: DC

## 2018-03-13 MED ORDER — DEXAMETHASONE SODIUM PHOSPHATE 10 MG/ML IJ SOLN
INTRAMUSCULAR | Status: DC | PRN
  Administered 2018-03-13: 5 mg via INTRAVENOUS

## 2018-03-13 MED ORDER — BUPIVACAINE LIPOSOME 1.3 % IJ SUSP
INTRAMUSCULAR | Status: AC
  Filled 2018-03-13: qty 20

## 2018-03-13 MED ORDER — LIDOCAINE HCL (CARDIAC) PF 100 MG/5ML IV SOSY
PREFILLED_SYRINGE | INTRAVENOUS | Status: DC | PRN
  Administered 2018-03-13: 100 mg via INTRAVENOUS

## 2018-03-13 MED ORDER — MIDAZOLAM HCL 2 MG/2ML IJ SOLN
INTRAMUSCULAR | Status: AC
  Filled 2018-03-13: qty 2

## 2018-03-13 MED ORDER — FAMOTIDINE 20 MG PO TABS
ORAL_TABLET | ORAL | Status: AC
  Administered 2018-03-13: 20 mg via ORAL
  Filled 2018-03-13: qty 1

## 2018-03-13 MED ORDER — ACETAMINOPHEN 10 MG/ML IV SOLN
INTRAVENOUS | Status: AC
  Filled 2018-03-13: qty 100

## 2018-03-13 MED ORDER — BUPIVACAINE HCL (PF) 0.5 % IJ SOLN
INTRAMUSCULAR | Status: AC
  Filled 2018-03-13: qty 30

## 2018-03-13 MED ORDER — CEFAZOLIN SODIUM-DEXTROSE 2-4 GM/100ML-% IV SOLN
2.0000 g | INTRAVENOUS | Status: AC
  Administered 2018-03-13: 2 g via INTRAVENOUS

## 2018-03-13 MED ORDER — POVIDONE-IODINE 7.5 % EX SOLN
Freq: Once | CUTANEOUS | Status: DC
  Filled 2018-03-13: qty 118

## 2018-03-13 MED ORDER — LIDOCAINE-EPINEPHRINE (PF) 1 %-1:200000 IJ SOLN
INTRAMUSCULAR | Status: AC
  Filled 2018-03-13: qty 30

## 2018-03-13 MED ORDER — ONDANSETRON HCL 4 MG PO TABS: 4.0000 mg | ORAL_TABLET | Freq: Four times a day (QID) | ORAL | Status: DC | PRN

## 2018-03-13 MED ORDER — BUPIVACAINE LIPOSOME 1.3 % IJ SUSP
INTRAMUSCULAR | Status: DC | PRN
  Administered 2018-03-13: 20 mL

## 2018-03-13 MED ORDER — FAMOTIDINE 20 MG PO TABS
20.0000 mg | ORAL_TABLET | Freq: Once | ORAL | Status: AC
  Administered 2018-03-13: 20 mg via ORAL

## 2018-03-13 MED ORDER — PHENYLEPHRINE HCL 10 MG/ML IJ SOLN
INTRAMUSCULAR | Status: DC | PRN
  Administered 2018-03-13 (×2): 100 ug via INTRAVENOUS
  Administered 2018-03-13: 200 ug via INTRAVENOUS
  Administered 2018-03-13: 100 ug via INTRAVENOUS

## 2018-03-13 MED ORDER — FENTANYL CITRATE (PF) 100 MCG/2ML IJ SOLN
INTRAMUSCULAR | Status: AC
  Filled 2018-03-13: qty 2

## 2018-03-13 SURGICAL SUPPLY — 46 items
BANDAGE ELASTIC 4 LF NS (GAUZE/BANDAGES/DRESSINGS) ×2 IMPLANT
BLADE OSC/SAGITTAL MD 5.5X18 (BLADE) ×2 IMPLANT
BLADE SURG MINI STRL (BLADE) ×2 IMPLANT
BNDG CMPR 75X21 PLY HI ABS (MISCELLANEOUS) ×1
BNDG CMPR MED 5X4 ELC HKLP NS (GAUZE/BANDAGES/DRESSINGS) ×1
BNDG CONFORM 3 STRL LF (GAUZE/BANDAGES/DRESSINGS) ×3 IMPLANT
BNDG ESMARK 4X12 TAN STRL LF (GAUZE/BANDAGES/DRESSINGS) ×2 IMPLANT
BNDG GAUZE 4.5X4.1 6PLY STRL (MISCELLANEOUS) ×2 IMPLANT
CANISTER SUCT 1200ML W/VALVE (MISCELLANEOUS) ×2 IMPLANT
COVER WAND RF STERILE (DRAPES) ×2 IMPLANT
DRAPE FLUOR MINI C-ARM 54X84 (DRAPES) ×1 IMPLANT
DRAPE XRAY CASSETTE 23X24 (DRAPES) ×1 IMPLANT
DURAPREP 26ML APPLICATOR (WOUND CARE) ×2 IMPLANT
ELECT REM PT RETURN 9FT ADLT (ELECTROSURGICAL) ×2
ELECTRODE REM PT RTRN 9FT ADLT (ELECTROSURGICAL) ×1 IMPLANT
GAUZE PACKING IODOFORM 1/2 (PACKING) ×2 IMPLANT
GAUZE PETRO XEROFOAM 1X8 (MISCELLANEOUS) ×2 IMPLANT
GAUZE SPONGE 4X4 12PLY STRL (GAUZE/BANDAGES/DRESSINGS) ×2 IMPLANT
GAUZE STRETCH 2X75IN STRL (MISCELLANEOUS) ×2 IMPLANT
GLOVE BIO SURGEON STRL SZ7.5 (GLOVE) ×2 IMPLANT
GLOVE INDICATOR 8.0 STRL GRN (GLOVE) ×2 IMPLANT
GOWN STRL REUS W/ TWL LRG LVL3 (GOWN DISPOSABLE) ×2 IMPLANT
GOWN STRL REUS W/TWL LRG LVL3 (GOWN DISPOSABLE) ×4
KIT TURNOVER KIT A (KITS) ×2 IMPLANT
LABEL OR SOLS (LABEL) ×1 IMPLANT
NDL FILTER BLUNT 18X1 1/2 (NEEDLE) ×1 IMPLANT
NDL HYPO 25X1 1.5 SAFETY (NEEDLE) ×1 IMPLANT
NEEDLE FILTER BLUNT 18X 1/2SAF (NEEDLE)
NEEDLE FILTER BLUNT 18X1 1/2 (NEEDLE) IMPLANT
NEEDLE HYPO 25X1 1.5 SAFETY (NEEDLE) ×2 IMPLANT
NS IRRIG 500ML POUR BTL (IV SOLUTION) ×2 IMPLANT
PACK EXTREMITY ARMC (MISCELLANEOUS) ×2 IMPLANT
PAD ABD DERMACEA PRESS 5X9 (GAUZE/BANDAGES/DRESSINGS) ×4 IMPLANT
PULSAVAC PLUS IRRIG FAN TIP (DISPOSABLE)
SHIELD FULL FACE ANTIFOG 7M (MISCELLANEOUS) IMPLANT
SOL .9 NS 3000ML IRR  AL (IV SOLUTION)
SOL .9 NS 3000ML IRR AL (IV SOLUTION)
SOL .9 NS 3000ML IRR UROMATIC (IV SOLUTION) ×1 IMPLANT
STOCKINETTE M/LG 89821 (MISCELLANEOUS) ×2 IMPLANT
STRAP SAFETY 5IN WIDE (MISCELLANEOUS) ×2 IMPLANT
SUT ETHILON 3-0 FS-10 30 BLK (SUTURE) ×2
SUT ETHILON 5-0 FS-2 18 BLK (SUTURE) ×2 IMPLANT
SUT VIC AB 4-0 FS2 27 (SUTURE) ×2 IMPLANT
SUTURE EHLN 3-0 FS-10 30 BLK (SUTURE) ×1 IMPLANT
SYR 10ML LL (SYRINGE) ×4 IMPLANT
TIP FAN IRRIG PULSAVAC PLUS (DISPOSABLE) IMPLANT

## 2018-03-13 NOTE — Op Note (Signed)
Operative note   Surgeon:Jalise Zawistowski Lawyer: None    Preop diagnosis: Gangrene left great toe    Postop diagnosis: Same    Procedure: Amputation left great toe MTPJ    EBL: Normal    Anesthesia:general with local.  Local consisted of a 3-2 mixture of 0.5% bupivacaine plain and Exparel long-acting anesthetic.  A total of 25 mL's was used.    Hemostasis: None    Specimen: Gangrenous left great toe for pathology.  Deep wound culture for aerobic and anaerobic    Complications: None    Operative indications:Phillip [name] is an 62 y.o. that presents today for surgical intervention.  The risks/benefits/alternatives/complications have been discussed and consent has been given.    Procedure:  Patient was brought into the OR and placed on the operating table in thesupine position. After anesthesia was obtained theright lower extremity was prepped and draped in usual sterile fashion.  Attention was directed to the left first MTPJ where 2 semielliptical incisions were made beginning at the level of the MTPJ and ending over the dorsal aspect of the base of the great toe and plantar aspect of the base of the great toe.  Thickness incision was then performed.  The toe was then disarticulated at the metatarsophalangeal joint.  Dorsally there was noted to be scant amount of purulent drainage.  A deep wound culture was performed at this time.  Toe was sent for pathological examination.  The wound was flushed with 700 mL's of saline with a bulb syringe.  All bleeders were Bovie cauterized as needed.  Mild bleeding was noted.  The distal incision was then closed with a 3-0 nylon.  The medial aspect of the incision was packed with half-inch iodoform packing.  A bulky sterile dressing was applied to the left foot.    Patient tolerated the procedure and anesthesia well.  Was transported from the OR to the PACU with all vital signs stable and vascular status intact. To be discharged per routine  protocol.  Will follow up in approximately 1 week in the outpatient clinic.

## 2018-03-13 NOTE — Anesthesia Postprocedure Evaluation (Signed)
Anesthesia Post Note  Patient: Phillip [name]  Procedure(s) Performed: AMPUTATION TOE-MPJ (Left Toe)  Patient location during evaluation: PACU Anesthesia Type: General Level of consciousness: awake and alert Pain management: pain level controlled Vital Signs Assessment: post-procedure vital signs reviewed and stable Respiratory status: spontaneous breathing, nonlabored ventilation, respiratory function stable and patient connected to nasal cannula oxygen Cardiovascular status: blood pressure returned to baseline and stable Postop Assessment: no apparent nausea or vomiting Anesthetic complications: no     Last Vitals:  Vitals:   03/13/18 1230 03/13/18 1245  BP: (!) 116/56 (!) 106/51  Pulse: 64   Resp: 16   Temp: (!) 36.2 C   SpO2: 100%     Last Pain:  Vitals:   03/13/18 1230  TempSrc:   PainSc: 0-No pain                 Martha Clan

## 2018-03-13 NOTE — Discharge Instructions (Signed)
[facility] °[facility] ° °POST OPERATIVE INSTRUCTIONS FOR DR. TROXLER AND DR. Lasheena Frieze °[facility] ° ° °1. Take your medication as prescribed.  Pain medication should be taken only as needed. ° °2. Keep the dressing clean, dry and intact. ° °3. Keep your foot elevated above the heart level for the first 48 hours. ° °4. Walking to the bathroom and brief periods of walking are acceptable, unless we have instructed you to be non-weight bearing. ° °5. Always wear your post-op shoe when walking.  Always use your crutches if you are to be non-weight bearing. ° °6. Do not take a shower. Baths are permissible as long as the foot is kept out of the water.  ° °7. Every hour you are awake:  °- Bend your knee 15 times. °- Flex foot 15 times °- Massage calf 15 times ° °8. Call [facility] ([phone]) if any of the following problems occur: °- You develop a temperature or fever. °- The bandage becomes saturated with blood. °- Medication does not stop your pain. °- Injury of the foot occurs. °- Any symptoms of infection including redness, odor, or red streaks running from wound. °-  ° °

## 2018-03-13 NOTE — H&P (Signed)
HISTORY AND PHYSICAL INTERVAL NOTE:  03/13/2018  10:11 AM  Phillip [name]  has presented today for surgery, with the diagnosis of Acute osteomyelitis of toe-left.  The various methods of treatment have been discussed with the patient.  No guarantees were given.  After consideration of risks, benefits and other options for treatment, the patient has consented to surgery.  I have reviewed the patients' chart and labs.     A history and physical examination was performed in my office.  The patient was reexamined.  There have been no changes to this history and physical examination.  Samara Deist A

## 2018-03-13 NOTE — Anesthesia Procedure Notes (Signed)
Procedure Name: LMA Insertion Date/Time: 03/13/2018 10:43 AM Performed by: Johnna Acosta, CRNA Pre-anesthesia Checklist: Patient identified, Emergency Drugs available, Suction available, Patient being monitored and Timeout performed Patient Re-evaluated:Patient Re-evaluated prior to induction Oxygen Delivery Method: Circle system utilized Preoxygenation: Pre-oxygenation with 100% oxygen Induction Type: IV induction LMA: LMA inserted LMA Size: 5.0 Tube type: Oral Number of attempts: 1 Tube secured with: Tape Dental Injury: Teeth and Oropharynx as per pre-operative assessment

## 2018-03-13 NOTE — Anesthesia Preprocedure Evaluation (Signed)
Anesthesia Evaluation  Patient identified by MRN, date of birth, ID band Patient awake    Reviewed: Allergy & Precautions, H&P , NPO status , Patient's Chart, lab work & pertinent test results, reviewed documented beta blocker date and time   History of Anesthesia Complications Negative for: history of anesthetic complications  Airway Mallampati: III  TM Distance: >3 FB Neck ROM: full    Dental  (+) Upper Dentures, Dental Advidsory Given, Partial Lower, Missing, Poor Dentition   Pulmonary neg shortness of breath, asthma (as a child) , neg sleep apnea, neg COPD, neg recent URI, former smoker,           Cardiovascular Exercise Tolerance: Good hypertension, (-) angina+ CAD, + CABG, + Peripheral Vascular Disease and +CHF  (-) Past MI and (-) Cardiac Stents (-) dysrhythmias + Valvular Problems/Murmurs      Neuro/Psych negative neurological ROS  negative psych ROS   GI/Hepatic Neg liver ROS, GERD  ,  Endo/Other  diabetes  Renal/GU CRFRenal disease  negative genitourinary   Musculoskeletal   Abdominal   Peds  Hematology negative hematology ROS (+)   Anesthesia Other Findings Past Medical History: No date: Asthma No date: CHF (congestive heart failure) ([facility]) No date: Coronary artery disease No date: Diabetes mellitus without complication ([place]) No date: Foot ulcer ([place])     Comment:  left foot No date: GERD (gastroesophageal reflux disease) No date: Heart murmur No date: Hypertension No date: Kidney insufficiency No date: Peripheral vascular disease ([facility])   Reproductive/Obstetrics negative OB ROS                             Anesthesia Physical Anesthesia Plan  ASA: III  Anesthesia Plan: General   Post-op Pain Management:    Induction: Intravenous  PONV Risk Score and Plan: 2 and Ondansetron, Dexamethasone, Midazolam, Promethazine and Treatment may vary due to age or medical  condition  Airway Management Planned: LMA  Additional Equipment:   Intra-op Plan:   Post-operative Plan: Extubation in OR  Informed Consent: I have reviewed the patients History and Physical, chart, labs and discussed the procedure including the risks, benefits and alternatives for the proposed anesthesia with the patient or authorized representative who has indicated his/her understanding and acceptance.   Dental Advisory Given  Plan Discussed with: Anesthesiologist, CRNA and Surgeon  Anesthesia Plan Comments:         Anesthesia Quick Evaluation

## 2018-03-13 NOTE — Anesthesia Post-op Follow-up Note (Signed)
Anesthesia QCDR form completed.        

## 2018-03-13 NOTE — Transfer of Care (Signed)
Immediate Anesthesia Transfer of Care Note  Patient: Phillip [name]  Procedure(s) Performed: AMPUTATION TOE-MPJ (Left Toe)  Patient Location: PACU  Anesthesia Type:General  Level of Consciousness: awake and drowsy  Airway & Oxygen Therapy: Patient Spontanous Breathing and Patient connected to face mask oxygen  Post-op Assessment: Report given to RN and Post -op Vital signs reviewed and stable  Post vital signs: Reviewed and stable  Last Vitals:  Vitals Value Taken Time  BP 100/59 03/13/2018 11:33 AM  Temp 35.9 C 03/13/2018 11:33 AM  Pulse 66 03/13/2018 11:33 AM  Resp 13 03/13/2018 11:33 AM  SpO2 100 % 03/13/2018 11:33 AM    Last Pain:  Vitals:   03/13/18 1133  TempSrc: Temporal         Complications: No apparent anesthesia complications

## 2018-03-14 ENCOUNTER — Encounter: Payer: Self-pay | Admitting: Podiatry

## 2018-03-17 LAB — AEROBIC CULTURE W GRAM STAIN (SUPERFICIAL SPECIMEN)

## 2018-03-17 LAB — SURGICAL PATHOLOGY

## 2018-03-19 DIAGNOSIS — M86172 Other acute osteomyelitis, left ankle and foot: Secondary | ICD-10-CM | POA: Diagnosis not present

## 2018-03-25 DIAGNOSIS — M86272 Subacute osteomyelitis, left ankle and foot: Secondary | ICD-10-CM

## 2018-03-25 DIAGNOSIS — Z87891 Personal history of nicotine dependence: Secondary | ICD-10-CM | POA: Diagnosis not present

## 2018-03-25 DIAGNOSIS — R001 Bradycardia, unspecified: Secondary | ICD-10-CM | POA: Insufficient documentation

## 2018-03-25 DIAGNOSIS — I2510 Atherosclerotic heart disease of native coronary artery without angina pectoris: Secondary | ICD-10-CM | POA: Diagnosis not present

## 2018-03-25 DIAGNOSIS — R5381 Other malaise: Secondary | ICD-10-CM | POA: Insufficient documentation

## 2018-03-25 DIAGNOSIS — M869 Osteomyelitis, unspecified: Secondary | ICD-10-CM | POA: Diagnosis not present

## 2018-03-25 DIAGNOSIS — N179 Acute kidney failure, unspecified: Secondary | ICD-10-CM | POA: Diagnosis not present

## 2018-03-25 DIAGNOSIS — N189 Chronic kidney disease, unspecified: Secondary | ICD-10-CM | POA: Diagnosis not present

## 2018-03-25 DIAGNOSIS — E161 Other hypoglycemia: Secondary | ICD-10-CM | POA: Diagnosis not present

## 2018-03-25 DIAGNOSIS — I129 Hypertensive chronic kidney disease with stage 1 through stage 4 chronic kidney disease, or unspecified chronic kidney disease: Secondary | ICD-10-CM | POA: Diagnosis not present

## 2018-03-25 DIAGNOSIS — R52 Pain, unspecified: Secondary | ICD-10-CM | POA: Diagnosis not present

## 2018-03-25 DIAGNOSIS — Z792 Long term (current) use of antibiotics: Secondary | ICD-10-CM | POA: Diagnosis not present

## 2018-03-25 DIAGNOSIS — E785 Hyperlipidemia, unspecified: Secondary | ICD-10-CM | POA: Diagnosis not present

## 2018-03-25 DIAGNOSIS — R5383 Other fatigue: Secondary | ICD-10-CM | POA: Insufficient documentation

## 2018-03-25 DIAGNOSIS — I25118 Atherosclerotic heart disease of native coronary artery with other forms of angina pectoris: Secondary | ICD-10-CM | POA: Diagnosis not present

## 2018-03-25 DIAGNOSIS — Z951 Presence of aortocoronary bypass graft: Secondary | ICD-10-CM | POA: Diagnosis not present

## 2018-03-25 DIAGNOSIS — Z79899 Other long term (current) drug therapy: Secondary | ICD-10-CM | POA: Diagnosis not present

## 2018-03-25 DIAGNOSIS — Z794 Long term (current) use of insulin: Secondary | ICD-10-CM | POA: Diagnosis not present

## 2018-03-25 DIAGNOSIS — E1122 Type 2 diabetes mellitus with diabetic chronic kidney disease: Secondary | ICD-10-CM | POA: Diagnosis not present

## 2018-03-25 DIAGNOSIS — Z23 Encounter for immunization: Secondary | ICD-10-CM | POA: Diagnosis not present

## 2018-03-25 DIAGNOSIS — R531 Weakness: Secondary | ICD-10-CM | POA: Diagnosis not present

## 2018-03-25 DIAGNOSIS — E162 Hypoglycemia, unspecified: Secondary | ICD-10-CM | POA: Diagnosis not present

## 2018-03-25 DIAGNOSIS — N183 Chronic kidney disease, stage 3 (moderate): Secondary | ICD-10-CM | POA: Diagnosis not present

## 2018-03-25 DIAGNOSIS — E875 Hyperkalemia: Secondary | ICD-10-CM | POA: Diagnosis not present

## 2018-03-25 DIAGNOSIS — R9431 Abnormal electrocardiogram [ECG] [EKG]: Secondary | ICD-10-CM | POA: Diagnosis not present

## 2018-03-25 DIAGNOSIS — I5020 Unspecified systolic (congestive) heart failure: Secondary | ICD-10-CM | POA: Diagnosis not present

## 2018-03-25 HISTORY — DX: Other malaise: R53.81

## 2018-03-25 HISTORY — DX: Bradycardia, unspecified: R00.1

## 2018-03-25 HISTORY — DX: Other fatigue: R53.83

## 2018-03-25 HISTORY — DX: Subacute osteomyelitis, left ankle and foot: M86.272

## 2018-03-26 ENCOUNTER — Encounter: Payer: Self-pay | Admitting: Endocrinology

## 2018-03-30 ENCOUNTER — Other Ambulatory Visit: Payer: Self-pay

## 2018-03-30 NOTE — Patient Outreach (Signed)
[facility]) Care Management  03/30/2018  Phillip [name] [birth year] [id number]   Referral received. No outreach warranted at this time. Transition of Care  will be completed by primary care provider office who will refer to [facility] care management if needed.  Plan: RN CM will close case.  Jone Baseman, RN, MSN [place] Management Care Management Coordinator Direct Line [phone] Cell [phone] Toll Free: [phone]  Fax: [phone]

## 2018-03-31 ENCOUNTER — Encounter (INDEPENDENT_AMBULATORY_CARE_PROVIDER_SITE_OTHER): Payer: PPO

## 2018-03-31 ENCOUNTER — Telehealth (INDEPENDENT_AMBULATORY_CARE_PROVIDER_SITE_OTHER): Payer: Self-pay

## 2018-03-31 ENCOUNTER — Ambulatory Visit (INDEPENDENT_AMBULATORY_CARE_PROVIDER_SITE_OTHER): Payer: PPO | Admitting: Vascular Surgery

## 2018-03-31 DIAGNOSIS — M86172 Other acute osteomyelitis, left ankle and foot: Secondary | ICD-10-CM | POA: Diagnosis not present

## 2018-03-31 NOTE — Telephone Encounter (Signed)
Hilda Blades called and stated that the patient's groin is red, has an odor since his procedure.  She wants to know what to do?

## 2018-04-01 DIAGNOSIS — T8781 Dehiscence of amputation stump: Secondary | ICD-10-CM | POA: Diagnosis not present

## 2018-04-01 DIAGNOSIS — Z7984 Long term (current) use of oral hypoglycemic drugs: Secondary | ICD-10-CM | POA: Diagnosis not present

## 2018-04-01 DIAGNOSIS — Z792 Long term (current) use of antibiotics: Secondary | ICD-10-CM | POA: Diagnosis not present

## 2018-04-01 DIAGNOSIS — Z89421 Acquired absence of other right toe(s): Secondary | ICD-10-CM | POA: Diagnosis not present

## 2018-04-01 DIAGNOSIS — Z7982 Long term (current) use of aspirin: Secondary | ICD-10-CM | POA: Diagnosis not present

## 2018-04-01 DIAGNOSIS — M86172 Other acute osteomyelitis, left ankle and foot: Secondary | ICD-10-CM | POA: Diagnosis not present

## 2018-04-01 DIAGNOSIS — Z89412 Acquired absence of left great toe: Secondary | ICD-10-CM | POA: Diagnosis not present

## 2018-04-01 DIAGNOSIS — E1169 Type 2 diabetes mellitus with other specified complication: Secondary | ICD-10-CM | POA: Diagnosis not present

## 2018-04-02 DIAGNOSIS — I25118 Atherosclerotic heart disease of native coronary artery with other forms of angina pectoris: Secondary | ICD-10-CM | POA: Diagnosis not present

## 2018-04-02 DIAGNOSIS — S71031D Puncture wound without foreign body, right hip, subsequent encounter: Secondary | ICD-10-CM | POA: Diagnosis not present

## 2018-04-02 DIAGNOSIS — E875 Hyperkalemia: Secondary | ICD-10-CM | POA: Diagnosis not present

## 2018-04-02 DIAGNOSIS — I739 Peripheral vascular disease, unspecified: Secondary | ICD-10-CM | POA: Diagnosis not present

## 2018-04-02 DIAGNOSIS — I130 Hypertensive heart and chronic kidney disease with heart failure and stage 1 through stage 4 chronic kidney disease, or unspecified chronic kidney disease: Secondary | ICD-10-CM | POA: Diagnosis not present

## 2018-04-02 DIAGNOSIS — S71031A Puncture wound without foreign body, right hip, initial encounter: Secondary | ICD-10-CM

## 2018-04-02 DIAGNOSIS — N183 Chronic kidney disease, stage 3 (moderate): Secondary | ICD-10-CM | POA: Diagnosis not present

## 2018-04-02 DIAGNOSIS — I5020 Unspecified systolic (congestive) heart failure: Secondary | ICD-10-CM | POA: Diagnosis not present

## 2018-04-02 DIAGNOSIS — R5381 Other malaise: Secondary | ICD-10-CM | POA: Diagnosis not present

## 2018-04-02 DIAGNOSIS — Z794 Long term (current) use of insulin: Secondary | ICD-10-CM | POA: Diagnosis not present

## 2018-04-02 DIAGNOSIS — M86272 Subacute osteomyelitis, left ankle and foot: Secondary | ICD-10-CM | POA: Diagnosis not present

## 2018-04-02 DIAGNOSIS — E1122 Type 2 diabetes mellitus with diabetic chronic kidney disease: Secondary | ICD-10-CM | POA: Diagnosis not present

## 2018-04-02 DIAGNOSIS — R5383 Other fatigue: Secondary | ICD-10-CM | POA: Diagnosis not present

## 2018-04-02 HISTORY — DX: Puncture wound without foreign body, right hip, initial encounter: S71.031A

## 2018-04-03 DIAGNOSIS — T8781 Dehiscence of amputation stump: Secondary | ICD-10-CM | POA: Diagnosis not present

## 2018-04-03 DIAGNOSIS — M86172 Other acute osteomyelitis, left ankle and foot: Secondary | ICD-10-CM | POA: Diagnosis not present

## 2018-04-03 DIAGNOSIS — E1169 Type 2 diabetes mellitus with other specified complication: Secondary | ICD-10-CM | POA: Diagnosis not present

## 2018-04-07 DIAGNOSIS — Z794 Long term (current) use of insulin: Secondary | ICD-10-CM | POA: Diagnosis not present

## 2018-04-07 DIAGNOSIS — E1122 Type 2 diabetes mellitus with diabetic chronic kidney disease: Secondary | ICD-10-CM | POA: Diagnosis not present

## 2018-04-07 DIAGNOSIS — R600 Localized edema: Secondary | ICD-10-CM | POA: Diagnosis not present

## 2018-04-07 DIAGNOSIS — N179 Acute kidney failure, unspecified: Secondary | ICD-10-CM | POA: Diagnosis not present

## 2018-04-07 DIAGNOSIS — I129 Hypertensive chronic kidney disease with stage 1 through stage 4 chronic kidney disease, or unspecified chronic kidney disease: Secondary | ICD-10-CM | POA: Diagnosis not present

## 2018-04-07 DIAGNOSIS — N183 Chronic kidney disease, stage 3 (moderate): Secondary | ICD-10-CM | POA: Diagnosis not present

## 2018-04-07 DIAGNOSIS — E875 Hyperkalemia: Secondary | ICD-10-CM | POA: Diagnosis not present

## 2018-04-09 ENCOUNTER — Ambulatory Visit (INDEPENDENT_AMBULATORY_CARE_PROVIDER_SITE_OTHER): Payer: PPO

## 2018-04-09 ENCOUNTER — Ambulatory Visit (INDEPENDENT_AMBULATORY_CARE_PROVIDER_SITE_OTHER): Payer: PPO | Admitting: Nurse Practitioner

## 2018-04-09 ENCOUNTER — Encounter (INDEPENDENT_AMBULATORY_CARE_PROVIDER_SITE_OTHER): Payer: Self-pay | Admitting: Nurse Practitioner

## 2018-04-09 ENCOUNTER — Other Ambulatory Visit (INDEPENDENT_AMBULATORY_CARE_PROVIDER_SITE_OTHER): Payer: Self-pay | Admitting: Nurse Practitioner

## 2018-04-09 VITALS — BP 138/71 | HR 70 | Resp 18 | Ht 72.0 in | Wt 214.0 lb

## 2018-04-09 DIAGNOSIS — E119 Type 2 diabetes mellitus without complications: Secondary | ICD-10-CM | POA: Diagnosis not present

## 2018-04-09 DIAGNOSIS — R1030 Lower abdominal pain, unspecified: Secondary | ICD-10-CM | POA: Diagnosis not present

## 2018-04-09 DIAGNOSIS — Z9889 Other specified postprocedural states: Secondary | ICD-10-CM | POA: Diagnosis not present

## 2018-04-09 DIAGNOSIS — I10 Essential (primary) hypertension: Secondary | ICD-10-CM | POA: Diagnosis not present

## 2018-04-09 DIAGNOSIS — I7025 Atherosclerosis of native arteries of other extremities with ulceration: Secondary | ICD-10-CM | POA: Diagnosis not present

## 2018-04-15 ENCOUNTER — Encounter (INDEPENDENT_AMBULATORY_CARE_PROVIDER_SITE_OTHER): Payer: Self-pay | Admitting: Nurse Practitioner

## 2018-04-15 NOTE — Progress Notes (Signed)
Subjective:    Patient ID: Phillip [name], male    DOB: Jul 24, 1955, 62 y.o.   MRN: [id number] Chief Complaint  Patient presents with  . Follow-up    Check groin for redness and odor    HPI  Phillip [name] is a 62 y.o. male that is presenting today for follow-up after angiogram of the left lower extremity with right femoral access.  The angiogram was done on 03/02/2018.  Subsequently following the angiogram the patient have any issues, however recently there is a small less than 2 mm opening of his groin.  The edges are smooth.  The patient's wife states that there was a oozing and odor from the groin previously.  Today there is no odor and little drainage.  The wife states that she began to clean it daily which seems to have improved the color, drainage, and odor.  Patient denies any strenuous or heavy activity.  The patient denies any fever, chills, nausea, vomiting.  The patient denies any claudication-like symptoms.  The patient underwent an arterial pseudoaneurysm study today there is no evidence of pseudoaneurysm DVT or an stone creation of AV fistula.  There is no evidence of foreign object.  Past Medical History:  Diagnosis Date  . Asthma   . CHF (congestive heart failure) ([place])   . Coronary artery disease   . Diabetes mellitus without complication ([place])   . Foot ulcer ([place])    left foot  . GERD (gastroesophageal reflux disease)   . Heart murmur   . Hypertension   . Kidney insufficiency   . Peripheral vascular disease [facility])     Past Surgical History:  Procedure Laterality Date  . AMPUTATION TOE Left 03/13/2018   Procedure: AMPUTATION TOE-MPJ;  Surgeon: Samara Deist, DPM;  Location: [facility];  Service: Podiatry;  Laterality: Left;  . ANGIOPLASTY    . CARDIAC CATHETERIZATION    . CARDIAC SURGERY     heart bypass (4)  . CORONARY ARTERY BYPASS GRAFT    . LOWER EXTREMITY ANGIOGRAPHY Left 03/02/2018   Procedure: LOWER EXTREMITY ANGIOGRAPHY;  Surgeon: Algernon Huxley,  MD;  Location: [facility];  Service: Cardiovascular;  Laterality: Left;  . ROTATOR CUFF REPAIR Left     Social History   Socioeconomic History  . Marital status: Married    Spouse name: Not on file  . Number of children: Not on file  . Years of education: Not on file  . Highest education level: Not on file  Occupational History  . Not on file  Social Needs  . Financial resource strain: Not on file  . Food insecurity:    Worry: Not on file    Inability: Not on file  . Transportation needs:    Medical: Not on file    Non-medical: Not on file  Tobacco Use  . Smoking status: Former Smoker    Last attempt to quit: 2003    Years since quitting: 16.8  . Smokeless tobacco: Never Used  Substance and Sexual Activity  . Alcohol use: Not Currently    Comment: 2003  . Drug use: Not Currently    Types: Marijuana  . Sexual activity: Not on file  Lifestyle  . Physical activity:    Days per week: Not on file    Minutes per session: Not on file  . Stress: Not on file  Relationships  . Social connections:    Talks on phone: Not on file    Gets together: Not on  file    Attends religious service: Not on file    Active member of club or organization: Not on file    Attends meetings of clubs or organizations: Not on file    Relationship status: Not on file  . Intimate partner violence:    Fear of current or ex partner: Not on file    Emotionally abused: Not on file    Physically abused: Not on file    Forced sexual activity: Not on file  Other Topics Concern  . Not on file  Social History Narrative  . Not on file    Family History  Problem Relation Age of Onset  . Hyperlipidemia Mother   . Hypertension Mother   . Diabetes Mother   . Heart attack Brother   . Heart disease Brother     No Known Allergies   Review of Systems   Review of Systems: Negative Unless Checked Constitutional: [] Weight loss  [] Fever  [] Chills Cardiac: [] Chest pain   []  Atrial  Fibrillation  [] Palpitations   [] Shortness of breath when laying flat   [] Shortness of breath with exertion. Vascular:  [] Pain in legs with walking   [] Pain in legs with standing  [] History of DVT   [] Phlebitis   [x] Swelling in legs   [] Varicose veins   [x] Non-healing ulcers Pulmonary:   [] Uses home oxygen   [] Productive cough   [] Hemoptysis   [] Wheeze  [] COPD   [] Asthma Neurologic:  [] Dizziness   [] Seizures   [] History of stroke   [] History of TIA  [] Aphasia   [] Vissual changes   [] Weakness or numbness in arm   [] Weakness or numbness in leg Musculoskeletal:   [] Joint swelling   [] Joint pain   [] Low back pain  []  History of Knee Replacement Hematologic:  [] Easy bruising  [] Easy bleeding   [] Hypercoagulable state   [] Anemic Gastrointestinal:  [] Diarrhea   [] Vomiting  [] Gastroesophageal reflux/heartburn   [] Difficulty swallowing. Genitourinary:  [] Chronic kidney disease   [] Difficult urination  [] Anuric   [] Blood in urine Skin:  [] Rashes   [] Ulcers  Psychological:  [] History of anxiety   []  History of major depression  []  Memory Difficulties     Objective:   Physical Exam  BP 138/71 (BP Location: Right Arm, Patient Position: Sitting)   Pulse 70   Resp 18   Ht 6' (1.829 m)   Wt 214 lb (97.1 kg)   BMI 29.02 kg/m   Gen: WD/WN, NAD Head: [place]/AT, No temporalis wasting.  Ear/Nose/Throat: Hearing grossly intact, nares w/o erythema or drainage Eyes: PER, EOMI, sclera nonicteric.  Neck: Supple, no masses.  No JVD.  Pulmonary:  Good air movement, no use of accessory muscles.  Cardiac: RRR Vascular:  Small open area in skin less than 2 mm and right groin.  Clean and dry. Vessel Right Left  Radial Palpable Palpable   Gastrointestinal: soft, non-distended. No guarding/no peritoneal signs.  Musculoskeletal: M/S 5/5 throughout.    Left great toe amputation Neurologic: Pain and light touch intact in extremities.  Symmetrical.  Speech is fluent. Motor exam as listed above. Psychiatric: Judgment  intact, Mood & affect appropriate for pt's clinical situation. Dermatologic: No Venous rashes. No Ulcers Noted.  No changes consistent with cellulitis. Lymph : No Cervical lymphadenopathy, no lichenification or skin changes of chronic lymphedema.      Assessment & Plan:   1. Atherosclerosis of native arteries of the extremities with ulceration ([place]) I have instructed the patient to keep the area clean and dry with daily cleanings.  A sugar to use bacitracin ointment on it daily.  I also applied Steri-Strips that area.  Attempt to get the area to close and approximate.  Will have patient follow-up in 1 to 2 weeks with ABIs to ensure that he has adequate blood flow to heal a recent amputation, as well as for follow-up purposes for his angiogram done on 03/02/2018. - VAS [place] ABI WITH/WO TBI; Future  2. Diabetes mellitus without complication ([place]) Continue hypoglycemic medications as already ordered, these medications have been reviewed and there are no changes at this time.  Hgb A1C to be monitored as already arranged by primary service   3. Essential hypertension Continue antihypertensive medications as already ordered, these medications have been reviewed and there are no changes at this time.     Current Outpatient Medications on File Prior to Visit  Medication Sig Dispense Refill  . acetaminophen (TYLENOL) 500 MG tablet Take 1,000 mg by mouth every 6 (six) hours as needed for moderate pain or headache.     Marland Kitchen amLODipine (NORVASC) 5 MG tablet Take 5 mg by mouth daily.    Marland Kitchen aspirin 81 MG tablet Take 81 mg by mouth daily.    . carvedilol (COREG) 6.25 MG tablet Take 6.25 mg by mouth 2 (two) times daily with a meal.     . cloNIDine (CATAPRES) 0.3 MG tablet Take 0.3 mg by mouth 2 (two) times daily.     . clopidogrel (PLAVIX) 75 MG tablet Take 1 tablet (75 mg total) by mouth daily. 30 tablet 11  . doxycycline (VIBRA-TABS) 100 MG tablet Take 1 tablet (100 mg total) by mouth 2 (two) times  daily. 20 tablet 1  . doxycycline (VIBRAMYCIN) 100 MG capsule Take 100 mg by mouth 2 (two) times daily.    . furosemide (LASIX) 40 MG tablet Take 40 mg by mouth 2 (two) times daily.     Marland Kitchen gabapentin (NEURONTIN) 600 MG tablet Take 600 mg by mouth 2 (two) times daily.     Marland Kitchen glipiZIDE (GLUCOTROL XL) 10 MG 24 hr tablet Take 10 mg by mouth daily with breakfast.    . insulin detemir (LEVEMIR) 100 UNIT/ML injection Inject 80 Units into the skin at bedtime.    . isosorbide mononitrate (IMDUR) 120 MG 24 hr tablet Take 120 mg by mouth daily.    Marland Kitchen lisinopril (PRINIVIL,ZESTRIL) 40 MG tablet Take 40 mg by mouth daily.     Marland Kitchen lovastatin (MEVACOR) 40 MG tablet Take 40 mg by mouth at bedtime.    . metolazone (ZAROXOLYN) 2.5 MG tablet TAKE 1 TABLET BY MOUTH ON MONDAY WEDNESDAY AND FRIDAY  2  . nitroGLYCERIN (NITROSTAT) 0.4 MG SL tablet Place 0.4 mg under the tongue every 5 (five) minutes as needed for chest pain.     Marland Kitchen oxyCODONE-acetaminophen (PERCOCET/ROXICET) 5-325 MG tablet Take by mouth.    . potassium chloride (KLOR-CON) 20 MEQ packet Take 20 mEq by mouth 2 (two) times daily.    Marland Kitchen spironolactone (ALDACTONE) 25 MG tablet Take 25 mg by mouth daily.  3  . vitamin B-12 (CYANOCOBALAMIN) 500 MCG tablet Take 500 mcg by mouth daily.    . ciprofloxacin (CIPRO) 500 MG tablet Take 500 mg by mouth 2 (two) times daily.  1   No current facility-administered medications on file prior to visit.     There are no Patient Instructions on file for this visit. No follow-ups on file.   Kris Hartmann, NP  This note was completed with Sales executive.  Any errors are purely unintentional.

## 2018-04-16 DIAGNOSIS — I96 Gangrene, not elsewhere classified: Secondary | ICD-10-CM | POA: Diagnosis not present

## 2018-04-16 DIAGNOSIS — L97522 Non-pressure chronic ulcer of other part of left foot with fat layer exposed: Secondary | ICD-10-CM | POA: Diagnosis not present

## 2018-04-16 DIAGNOSIS — M86172 Other acute osteomyelitis, left ankle and foot: Secondary | ICD-10-CM | POA: Diagnosis not present

## 2018-04-17 DIAGNOSIS — E119 Type 2 diabetes mellitus without complications: Secondary | ICD-10-CM | POA: Diagnosis not present

## 2018-04-17 DIAGNOSIS — R040 Epistaxis: Secondary | ICD-10-CM | POA: Diagnosis not present

## 2018-04-17 DIAGNOSIS — Z951 Presence of aortocoronary bypass graft: Secondary | ICD-10-CM | POA: Diagnosis not present

## 2018-04-17 DIAGNOSIS — Z7982 Long term (current) use of aspirin: Secondary | ICD-10-CM | POA: Diagnosis not present

## 2018-04-17 DIAGNOSIS — Z87891 Personal history of nicotine dependence: Secondary | ICD-10-CM | POA: Diagnosis not present

## 2018-04-17 DIAGNOSIS — Z6829 Body mass index (BMI) 29.0-29.9, adult: Secondary | ICD-10-CM | POA: Diagnosis not present

## 2018-04-17 DIAGNOSIS — I110 Hypertensive heart disease with heart failure: Secondary | ICD-10-CM | POA: Diagnosis not present

## 2018-04-17 DIAGNOSIS — Z7902 Long term (current) use of antithrombotics/antiplatelets: Secondary | ICD-10-CM | POA: Diagnosis not present

## 2018-04-17 DIAGNOSIS — I509 Heart failure, unspecified: Secondary | ICD-10-CM | POA: Diagnosis not present

## 2018-04-24 ENCOUNTER — Ambulatory Visit (INDEPENDENT_AMBULATORY_CARE_PROVIDER_SITE_OTHER): Payer: PPO

## 2018-04-24 ENCOUNTER — Ambulatory Visit (INDEPENDENT_AMBULATORY_CARE_PROVIDER_SITE_OTHER): Payer: PPO | Admitting: Vascular Surgery

## 2018-04-24 VITALS — BP 115/61 | HR 67 | Resp 16 | Ht 72.0 in | Wt 217.4 lb

## 2018-04-24 DIAGNOSIS — I509 Heart failure, unspecified: Secondary | ICD-10-CM

## 2018-04-24 DIAGNOSIS — I10 Essential (primary) hypertension: Secondary | ICD-10-CM | POA: Diagnosis not present

## 2018-04-24 DIAGNOSIS — L97529 Non-pressure chronic ulcer of other part of left foot with unspecified severity: Secondary | ICD-10-CM | POA: Diagnosis not present

## 2018-04-24 DIAGNOSIS — I7025 Atherosclerosis of native arteries of other extremities with ulceration: Secondary | ICD-10-CM

## 2018-04-24 DIAGNOSIS — L97509 Non-pressure chronic ulcer of other part of unspecified foot with unspecified severity: Secondary | ICD-10-CM | POA: Insufficient documentation

## 2018-04-24 DIAGNOSIS — Z87891 Personal history of nicotine dependence: Secondary | ICD-10-CM

## 2018-04-24 DIAGNOSIS — E119 Type 2 diabetes mellitus without complications: Secondary | ICD-10-CM | POA: Diagnosis not present

## 2018-04-24 NOTE — Progress Notes (Signed)
MRN : [id number]  Phillip [name] is a 62 y.o. (1955/08/05) male who presents with chief complaint of  Chief Complaint  Patient presents with  . Follow-up    pt conv abi ultrasound follow up  .  History of Present Illness: Patient returns today in follow up of his PAD.  He underwent left lower extremity revascularization about 6 or 7 weeks ago.  He has had a fair bit of reperfusion pain.  He says his wound is gradually healing and he continues to follow with podiatry.  Access site still has a slight opening but no sign of infection and a duplex showed no hematoma or pseudoaneurysm or retained foreign body.  His ABIs today are 1.07 on the right and 0.99 on the left with biphasic waveforms.  Although these pressures may be somewhat elevated from medial calcification, his flow does appear to be reasonably good.  His left ABI was 0.58 prior to intervention.  Current Outpatient Medications  Medication Sig Dispense Refill  . acetaminophen (TYLENOL) 500 MG tablet Take 1,000 mg by mouth every 6 (six) hours as needed for moderate pain or headache.     Marland Kitchen amLODipine (NORVASC) 5 MG tablet Take 5 mg by mouth daily.    Marland Kitchen aspirin 81 MG tablet Take 81 mg by mouth daily.    . carvedilol (COREG) 6.25 MG tablet Take 6.25 mg by mouth 2 (two) times daily with a meal.     . ciprofloxacin (CIPRO) 500 MG tablet Take 500 mg by mouth 2 (two) times daily.  1  . cloNIDine (CATAPRES) 0.3 MG tablet Take 0.3 mg by mouth 2 (two) times daily.     . clopidogrel (PLAVIX) 75 MG tablet Take 1 tablet (75 mg total) by mouth daily. 30 tablet 11  . doxycycline (VIBRA-TABS) 100 MG tablet Take 1 tablet (100 mg total) by mouth 2 (two) times daily. 20 tablet 1  . furosemide (LASIX) 40 MG tablet Take 40 mg by mouth 2 (two) times daily.     Marland Kitchen gabapentin (NEURONTIN) 600 MG tablet Take 600 mg by mouth 2 (two) times daily.     Marland Kitchen glipiZIDE (GLUCOTROL XL) 10 MG 24 hr tablet Take 10 mg by mouth daily with breakfast.    . insulin detemir  (LEVEMIR) 100 UNIT/ML injection Inject 80 Units into the skin at bedtime.    . isosorbide mononitrate (IMDUR) 120 MG 24 hr tablet Take 120 mg by mouth daily.    Marland Kitchen lisinopril (PRINIVIL,ZESTRIL) 40 MG tablet Take 40 mg by mouth daily.     Marland Kitchen lovastatin (MEVACOR) 40 MG tablet Take 40 mg by mouth at bedtime.    . metolazone (ZAROXOLYN) 2.5 MG tablet TAKE 1 TABLET BY MOUTH ON MONDAY WEDNESDAY AND FRIDAY  2  . nitroGLYCERIN (NITROSTAT) 0.4 MG SL tablet Place 0.4 mg under the tongue every 5 (five) minutes as needed for chest pain.     Marland Kitchen oxyCODONE-acetaminophen (PERCOCET/ROXICET) 5-325 MG tablet Take by mouth.    . potassium chloride (KLOR-CON) 20 MEQ packet Take 20 mEq by mouth 2 (two) times daily.    Marland Kitchen spironolactone (ALDACTONE) 25 MG tablet Take 25 mg by mouth daily.  3  . vitamin B-12 (CYANOCOBALAMIN) 500 MCG tablet Take 500 mcg by mouth daily.    Marland Kitchen doxycycline (VIBRAMYCIN) 100 MG capsule Take 100 mg by mouth 2 (two) times daily.     No current facility-administered medications for this visit.     Past Medical History:  Diagnosis Date  .  Asthma   . CHF (congestive heart failure) ([place])   . Coronary artery disease   . Diabetes mellitus without complication ([place])   . Foot ulcer ([place])    left foot  . GERD (gastroesophageal reflux disease)   . Heart murmur   . Hypertension   . Kidney insufficiency   . Peripheral vascular disease [facility])     Past Surgical History:  Procedure Laterality Date  . AMPUTATION TOE Left 03/13/2018   Procedure: AMPUTATION TOE-MPJ;  Surgeon: Samara Deist, DPM;  Location: [facility] ORS;  Service: Podiatry;  Laterality: Left;  . ANGIOPLASTY    . CARDIAC CATHETERIZATION    . CARDIAC SURGERY     heart bypass (4)  . CORONARY ARTERY BYPASS GRAFT    . LOWER EXTREMITY ANGIOGRAPHY Left 03/02/2018   Procedure: LOWER EXTREMITY ANGIOGRAPHY;  Surgeon: Algernon Huxley, MD;  Location: [facility];  Service: Cardiovascular;  Laterality: Left;  . ROTATOR CUFF REPAIR Left      Social History Social History   Tobacco Use  . Smoking status: Former Smoker    Last attempt to quit: 2003    Years since quitting: 16.9  . Smokeless tobacco: Never Used  Substance Use Topics  . Alcohol use: Not Currently    Comment: 2003  . Drug use: Not Currently    Types: Marijuana     Family History Family History  Problem Relation Age of Onset  . Hyperlipidemia Mother   . Hypertension Mother   . Diabetes Mother   . Heart attack Brother   . Heart disease Brother     No Known Allergies  REVIEW OF SYSTEMS (Negative unless checked)  Constitutional: _0 Weight loss  _1 Fever  _2 Chills Cardiac: _3 Chest pain   _4 Chest pressure   _5 Palpitations   _6 Shortness of breath when laying flat   _7 Shortness of breath at rest   _8 Shortness of breath with exertion. Vascular:  _9 Pain in legs with walking   _10 Pain in legs at rest   _11 Pain in legs when laying flat   _12 Claudication   _13 Pain in feet when walking  _14 Pain in feet at rest  _15 Pain in feet when laying flat   _16 History of DVT   _17 Phlebitis   _18 Swelling in legs   _19 Varicose veins   _20 Non-healing ulcers Pulmonary:   _21 Uses home oxygen   _22 Productive cough   _23 Hemoptysis   _24 Wheeze  _25 COPD   _26 Asthma Neurologic:  _27 Dizziness  _28 Blackouts   _29 Seizures   _30 History of stroke   _31 History of TIA  _32 Aphasia   _33 Temporary blindness   _34 Dysphagia   _35 Weakness or numbness in arms   _36 Weakness or numbness in legs Musculoskeletal:  _37 Arthritis   _38 Joint swelling   _39 Joint pain   _40 Low back pain Hematologic:  _41 Easy bruising  _42 Easy bleeding   _43 Hypercoagulable state   _44 Anemic  _45 Hepatitis Gastrointestinal:  _46 Blood in stool   _47 Vomiting blood  _48 Gastroesophageal reflux/heartburn   _49 Abdominal pain Genitourinary:  _50 Chronic kidney disease   _51 Difficult urination  _52 Frequent urination  _53 Burning with urination   _54 Hematuria Skin:  _55 Rashes   _56 Ulcers   _57 Wounds Psychological:  _58 History of anxiety   _59  History of major  depression.   Physical Examination  BP 115/61 (BP Location: Right Arm)   Pulse 67   Resp 16   Ht 6' (1.829 m)   Wt 217 lb 6.4 oz (98.6 kg)   BMI 29.48 kg/m  Gen:  WD/WN, NAD Head: [place]/AT, No temporalis wasting. Ear/Nose/Throat: Hearing grossly intact, nares w/o erythema or drainage  Eyes: Conjunctiva clear. Sclera non-icteric Neck: Supple.  Trachea midline Pulmonary:  Good air movement, no use of accessory muscles.  Cardiac: RRR, no JVD Vascular:  Vessel Right Left  Radial Palpable Palpable                          PT  1+ palpable  1+ palpable  DP  1+ palpable  2+ palpable    Musculoskeletal: M/S 5/5 throughout.  No deformity or atrophy.  Using a walker.  Mild lower extremity edema.  Left foot currently dressed. Neurologic: Sensation grossly intact in extremities.  Symmetrical.  Speech is fluent.  Psychiatric: Judgment intact, Mood & affect appropriate for pt's clinical situation. Dermatologic: Left foot in a walking boot with a dressing in place.       Labs Recent Results (from the past 2160 hour(s))  BUN     Status: Abnormal   Collection Time: 02/27/18 11:19 AM  Result Value Ref Range   BUN 72 (H) 8 - 23 mg/dL    Comment: Performed at [facility], [address] [place], [place] [address]  Creatinine, serum     Status: Abnormal   Collection Time: 02/27/18 11:19 AM  Result Value Ref Range   Creatinine, Ser 2.23 (H) 0.61 - 1.24 mg/dL   GFR calc non Af Amer 30 (L) >60 mL/min   GFR calc Af Amer 35 (L) >60 mL/min    Comment: (NOTE) The eGFR has been calculated using the CKD EPI equation. This calculation has not been validated in all clinical situations. eGFR's persistently <60 mL/min signify possible Chronic Kidney Disease. Performed at [facility], [address] [place], [place] [address]   Protime-INR     Status: None   Collection Time: 03/11/18  2:22 PM  Result Value Ref Range   Prothrombin Time 14.4 11.4 - 15.2 seconds   INR  1.13     Comment: Performed at [facility], [address] [place], [place] [address]  Glucose, capillary     Status: Abnormal   Collection Time: 03/13/18  8:57 AM  Result Value Ref Range   Glucose-Capillary 47 (L) 70 - 99 mg/dL  Urine Drug Screen, Qualitative ([facility] only)     Status: None   Collection Time: 03/13/18  9:07 AM  Result Value Ref Range   Tricyclic, Ur Screen NONE DETECTED NONE DETECTED   Amphetamines, Ur Screen NONE DETECTED NONE DETECTED   MDMA (Ecstasy)Ur Screen NONE DETECTED NONE DETECTED   Cocaine Metabolite,Ur [place] NONE DETECTED NONE DETECTED   Opiate, Ur Screen NONE DETECTED NONE DETECTED   Phencyclidine (PCP) Ur S NONE DETECTED NONE DETECTED   Cannabinoid 50 Ng, Ur [place] NONE DETECTED NONE DETECTED   Barbiturates, Ur Screen NONE DETECTED NONE DETECTED   Benzodiazepine, Ur Scrn NONE DETECTED NONE DETECTED   Methadone Scn, Ur NONE DETECTED NONE DETECTED    Comment: (NOTE) Tricyclics + metabolites, urine    Cutoff 1000 ng/mL Amphetamines + metabolites, urine  Cutoff 1000 ng/mL MDMA (Ecstasy), urine              Cutoff 500 ng/mL Cocaine Metabolite, urine          Cutoff 300 ng/mL Opiate + metabolites, urine        Cutoff 300 ng/mL Phencyclidine (PCP), urine         Cutoff 25 ng/mL Cannabinoid, urine                 Cutoff  50 ng/mL Barbiturates + metabolites, urine  Cutoff 200 ng/mL Benzodiazepine, urine              Cutoff 200 ng/mL Methadone, urine                   Cutoff 300 ng/mL The urine drug screen provides only a preliminary, unconfirmed analytical test result and should not be used for non-medical purposes. Clinical consideration and professional judgment should be applied to any positive drug screen result due to possible interfering substances. A more specific alternate chemical method must be used in order to obtain a confirmed analytical result. Gas chromatography / mass spectrometry (GC/MS) is the preferred confirmat ory method. Performed at  [facility], [address] [place], [place] [address]   Glucose, capillary     Status: Abnormal   Collection Time: 03/13/18  9:48 AM  Result Value Ref Range   Glucose-Capillary 104 (H) 70 - 99 mg/dL  Aerobic Culture (superficial specimen)     Status: None   Collection Time: 03/13/18 10:06 AM  Result Value Ref Range   Specimen Description      WOUND Performed at [facility], [address], [place], [place] [address]    Special Requests      NONE Performed at [facility], [address], [place], [place] [address]    Gram Stain      RARE WBC PRESENT,BOTH PMN AND MONONUCLEAR RARE GRAM POSITIVE COCCI Performed at [facility], [place] [address], [place], [place] [address]    Culture      FEW ENTEROBACTER CLOACAE FEW VIRIDANS STREPTOCOCCUS FEW SERRATIA MARCESCENS    Report Status 03/17/2018 FINAL    Organism ID, Bacteria ENTEROBACTER CLOACAE    Organism ID, Bacteria SERRATIA MARCESCENS       Susceptibility   Enterobacter cloacae - MIC*    CEFAZOLIN >=64 RESISTANT Resistant     CEFEPIME <=1 SENSITIVE Sensitive     CEFTAZIDIME <=1 SENSITIVE Sensitive     CEFTRIAXONE <=1 SENSITIVE Sensitive     CIPROFLOXACIN <=0.25 SENSITIVE Sensitive     GENTAMICIN <=1 SENSITIVE Sensitive     IMIPENEM <=0.25 SENSITIVE Sensitive     TRIMETH/SULFA <=20 SENSITIVE Sensitive     PIP/TAZO 16 SENSITIVE Sensitive     * FEW ENTEROBACTER CLOACAE   Serratia marcescens - MIC*    CEFAZOLIN >=64 RESISTANT Resistant     CEFEPIME <=1 SENSITIVE Sensitive     CEFTAZIDIME <=1 SENSITIVE Sensitive     CEFTRIAXONE <=1 SENSITIVE Sensitive     CIPROFLOXACIN <=0.25 SENSITIVE Sensitive     GENTAMICIN <=1 SENSITIVE Sensitive     TRIMETH/SULFA <=20 SENSITIVE Sensitive     * FEW SERRATIA MARCESCENS  Surgical pathology     Status: None   Collection Time: 03/13/18 11:09 AM  Result Value Ref Range   SURGICAL PATHOLOGY      Surgical Pathology CASE: [id number] PATIENT:  Helyn Numbers Surgical Pathology Report     SPECIMEN SUBMITTED: A. Toe, left great  CLINICAL HISTORY: None provided  PRE-OPERATIVE DIAGNOSIS: Acute osteomyelitis of toe-left  POST-OPERATIVE DIAGNOSIS: None provided.     DIAGNOSIS: A.  TOE, LEFT GREAT: - DIGIT SHOWING GANGRENE, ULCERATION AND ACUTE OSTEOMYELITIS. - OSTEOMYELITIS EXTENDS TO THE PROXIMAL MARGIN. - THE SKIN MARGIN IS VIABLE.  GROSS DESCRIPTION: A. Labeled: Left great toe Received: In formalin Size: 7.6 x 2.8 x 2.5 cm Description of lesion(s): Entire anterior aspect of the digit is discolored gray to brown with ulcerated  at the nail Proximal margin: Skin discoloration grossly extends to margin Bone: Bone at margin is a smooth articular surface Other findings: Margin is inked blue  Block summary: 1 - perpendicular skin margin 2 - en face bone at margin 3 - representative ulceration in the distal aspect with underlying bone  Tissue de calcification: 2-3  Final Diagnosis performed by Galvin Proffer, MD.   Electronically signed 03/17/2018 11:44:20AM The electronic signature indicates that the named Attending Pathologist has evaluated the specimen  Technical component performed at [facility], [address], [place], [place] [address] Lab: [phone] Dir: Rush Farmer, MD, MMM  Professional component performed at [facility], [facility], [place], [place], [place] [address] Lab: [phone] Dir: Dellia Nims. Rubinas, MD   Glucose, capillary     Status: Abnormal   Collection Time: 03/13/18 11:42 AM  Result Value Ref Range   Glucose-Capillary 60 (L) 70 - 99 mg/dL  Glucose, capillary     Status: None   Collection Time: 03/13/18 12:06 PM  Result Value Ref Range   Glucose-Capillary 77 70 - 99 mg/dL  Glucose, capillary     Status: Abnormal   Collection Time: 03/13/18 12:50 PM  Result Value Ref Range   Glucose-Capillary 115 (H) 70 - 99 mg/dL    Radiology Vas [place] Groin  Pseudoaneurysm  Result Date: 04/09/2018  ARTERIAL PSEUDOANEURYSM  Exam: Right groin History: S/p angio. Limitations: Mild oozing of stick site Performing Technologist: Concha Norway RVT  Examination Guidelines: A complete evaluation includes B-mode imaging, spectral Doppler, color Doppler, and power Doppler as needed of all accessible portions of each vessel. Bilateral testing is considered an integral part of a complete examination. Limited examinations for reoccurring indications may be performed as noted. +------------+----------+--------+------+---------------------------+ Right DuplexPSV (cm/s)WaveformPlaque        Comment(s)          +------------+----------+--------+------+---------------------------+ CFA             85    biphasic      Additionally Rt CFV NL flow +------------+----------+--------+------+---------------------------+  Summary: No evidence of pseudoaneurysm, AVF or DVT  Diagnosing physician: Hortencia Pilar MD Electronically signed by Hortencia Pilar MD on 04/09/2018 at 5:02:46 PM.   --------------------------------------------------------------------------------    Final    Vas [place] Abi With/wo Tbi  Result Date: 04/24/2018 LOWER EXTREMITY DOPPLER STUDY Indications: Peripheral artery disease.  Vascular Interventions: 08/30/13: Right popliteal, TP trunk & peroneal artery                         PTAs;                         03/02/18: Left popliteal, TP trunk & posterior tibial                         artery PTAs;. Comparison Study: 03/13/2018 Performing Technologist: Concha Norway RVT  Examination Guidelines: A complete evaluation includes at minimum, Doppler waveform signals and systolic blood pressure reading at the level of bilateral brachial, anterior tibial, and posterior tibial arteries, when vessel segments are accessible. Bilateral testing is considered an integral part of a complete examination. Photoelectric Plethysmograph (PPG) waveforms and toe systolic pressure readings  are included as required and additional duplex testing as needed. Limited examinations for reoccurring indications may be performed as noted.  ABI Findings: +---------+------------------+-----+--------+--------+ Right    Rt Pressure (mmHg)IndexWaveformComment  +---------+------------------+-----+--------+--------+ Brachial 125                                     +---------+------------------+-----+--------+--------+  ATA      198               1.58                  +---------+------------------+-----+--------+--------+ PTA      134               1.07                  +---------+------------------+-----+--------+--------+ Great Toe84                0.67 Normal           +---------+------------------+-----+--------+--------+ +---------+------------------+-----+--------+---------+ Left     Lt Pressure (mmHg)IndexWaveformComment   +---------+------------------+-----+--------+---------+ Brachial 125                                      +---------+------------------+-----+--------+---------+ ATA      124               0.99                   +---------+------------------+-----+--------+---------+ PTA                                     NonComp   +---------+------------------+-----+--------+---------+ Great Toe49                0.39 Abnormal2nd digit +---------+------------------+-----+--------+---------+ +-------+-----------+-----------+------------+------------+ ABI/TBIToday's ABIToday's TBIPrevious ABIPrevious TBI +-------+-----------+-----------+------------+------------+ Right  1.07       .67        1.27        .61          +-------+-----------+-----------+------------+------------+ Left   .99        .39        .58         .23          +-------+-----------+-----------+------------+------------+ Right ABIs appear essentially unchanged compared to prior study on 03/13/2018. Left ABIs appear increased compared to prior study on 03/13/2018.   Summary: Right: Resting right ankle-brachial index is within normal range. No evidence of significant right lower extremity arterial disease. The right toe-brachial index is abnormal. TBI is mildly down consistent with mild disease. Left: Resting left ankle-brachial index is within normal range. No evidence of significant left lower extremity arterial disease. The left toe-brachial index is abnormal. PPG tracings appear dampened. PTA flow is non-compressible, ATA flow is within normal limits. TBI consistent with mild to moderate disease.  *See table(s) above for measurements and observations.  Electronically signed by Leotis Pain MD on 04/24/2018 at 2:01:27 PM.    Final     Assessment/Plan Diabetes mellitus without complication ([place]) blood glucose control important in reducing the progression of atherosclerotic disease. Also, involved in wound healing. On appropriate medications.  His control is suboptimal.  Hypertension blood pressure control important in reducing the progression of atherosclerotic disease. On appropriate oral medications.   CHF (congestive heart failure) ([facility]) Poor cardiac function could certainly be contributing to poor blood supply to the feet and toes as well.  Foot ulcer ([place]) Follows with podiatry.  Perfusion has been improved for increased chance of wound healing.  Atherosclerosis of native arteries of the extremities with ulceration ([place]) His ABIs today are 1.07 on the right and 0.99 on the left with biphasic waveforms.  Although these pressures may be somewhat  elevated from medial calcification, his flow does appear to be reasonably good.  His left ABI was 0.58 prior to intervention. At this point, we will stretch out his follow-up and see him back in about 3 to 4 months.  Continue aspirin, Plavix, and statin agent.  Continue to follow with podiatry for wound care.  Contact our office with any problems between now and his next visit.    Leotis Pain,  MD  04/24/2018 4:15 PM    This note was created with Dragon medical transcription system.  Any errors from dictation are purely unintentional

## 2018-04-24 NOTE — Assessment & Plan Note (Signed)
His ABIs today are 1.07 on the right and 0.99 on the left with biphasic waveforms.  Although these pressures may be somewhat elevated from medial calcification, his flow does appear to be reasonably good.  His left ABI was 0.58 prior to intervention. At this point, we will stretch out his follow-up and see him back in about 3 to 4 months.  Continue aspirin, Plavix, and statin agent.  Continue to follow with podiatry for wound care.  Contact our office with any problems between now and his next visit.

## 2018-04-24 NOTE — Assessment & Plan Note (Signed)
Follows with podiatry.  Perfusion has been improved for increased chance of wound healing.

## 2018-04-24 NOTE — Patient Instructions (Signed)

## 2018-04-28 DIAGNOSIS — T8781 Dehiscence of amputation stump: Secondary | ICD-10-CM | POA: Diagnosis not present

## 2018-05-05 DIAGNOSIS — I5020 Unspecified systolic (congestive) heart failure: Secondary | ICD-10-CM | POA: Diagnosis not present

## 2018-05-05 DIAGNOSIS — E1122 Type 2 diabetes mellitus with diabetic chronic kidney disease: Secondary | ICD-10-CM | POA: Diagnosis not present

## 2018-05-05 DIAGNOSIS — E1142 Type 2 diabetes mellitus with diabetic polyneuropathy: Secondary | ICD-10-CM | POA: Diagnosis not present

## 2018-05-05 DIAGNOSIS — Z79899 Other long term (current) drug therapy: Secondary | ICD-10-CM | POA: Diagnosis not present

## 2018-05-05 DIAGNOSIS — N183 Chronic kidney disease, stage 3 (moderate): Secondary | ICD-10-CM | POA: Diagnosis not present

## 2018-05-05 DIAGNOSIS — I130 Hypertensive heart and chronic kidney disease with heart failure and stage 1 through stage 4 chronic kidney disease, or unspecified chronic kidney disease: Secondary | ICD-10-CM | POA: Diagnosis not present

## 2018-05-05 DIAGNOSIS — Z794 Long term (current) use of insulin: Secondary | ICD-10-CM | POA: Diagnosis not present

## 2018-05-05 DIAGNOSIS — L97522 Non-pressure chronic ulcer of other part of left foot with fat layer exposed: Secondary | ICD-10-CM | POA: Diagnosis not present

## 2018-05-07 DIAGNOSIS — Z87891 Personal history of nicotine dependence: Secondary | ICD-10-CM | POA: Diagnosis not present

## 2018-05-07 DIAGNOSIS — N183 Chronic kidney disease, stage 3 (moderate): Secondary | ICD-10-CM | POA: Diagnosis not present

## 2018-05-07 DIAGNOSIS — I130 Hypertensive heart and chronic kidney disease with heart failure and stage 1 through stage 4 chronic kidney disease, or unspecified chronic kidney disease: Secondary | ICD-10-CM | POA: Diagnosis not present

## 2018-05-07 DIAGNOSIS — E559 Vitamin D deficiency, unspecified: Secondary | ICD-10-CM

## 2018-05-07 DIAGNOSIS — Z794 Long term (current) use of insulin: Secondary | ICD-10-CM | POA: Diagnosis not present

## 2018-05-07 DIAGNOSIS — I509 Heart failure, unspecified: Secondary | ICD-10-CM | POA: Diagnosis not present

## 2018-05-07 DIAGNOSIS — E1122 Type 2 diabetes mellitus with diabetic chronic kidney disease: Secondary | ICD-10-CM | POA: Diagnosis not present

## 2018-05-07 HISTORY — DX: Vitamin D deficiency, unspecified: E55.9

## 2018-06-02 DIAGNOSIS — L97522 Non-pressure chronic ulcer of other part of left foot with fat layer exposed: Secondary | ICD-10-CM | POA: Diagnosis not present

## 2018-06-30 DIAGNOSIS — L97522 Non-pressure chronic ulcer of other part of left foot with fat layer exposed: Secondary | ICD-10-CM | POA: Diagnosis not present

## 2018-06-30 DIAGNOSIS — E1142 Type 2 diabetes mellitus with diabetic polyneuropathy: Secondary | ICD-10-CM | POA: Diagnosis not present

## 2018-07-31 ENCOUNTER — Encounter (INDEPENDENT_AMBULATORY_CARE_PROVIDER_SITE_OTHER): Payer: PPO

## 2018-07-31 ENCOUNTER — Ambulatory Visit (INDEPENDENT_AMBULATORY_CARE_PROVIDER_SITE_OTHER): Payer: PPO | Admitting: Vascular Surgery

## 2018-07-31 DIAGNOSIS — Z794 Long term (current) use of insulin: Secondary | ICD-10-CM | POA: Diagnosis not present

## 2018-07-31 DIAGNOSIS — E113413 Type 2 diabetes mellitus with severe nonproliferative diabetic retinopathy with macular edema, bilateral: Secondary | ICD-10-CM | POA: Diagnosis not present

## 2018-07-31 DIAGNOSIS — H2513 Age-related nuclear cataract, bilateral: Secondary | ICD-10-CM | POA: Diagnosis not present

## 2018-08-10 DIAGNOSIS — N183 Chronic kidney disease, stage 3 (moderate): Secondary | ICD-10-CM | POA: Diagnosis not present

## 2018-08-11 DIAGNOSIS — B351 Tinea unguium: Secondary | ICD-10-CM | POA: Diagnosis not present

## 2018-08-11 DIAGNOSIS — L97522 Non-pressure chronic ulcer of other part of left foot with fat layer exposed: Secondary | ICD-10-CM | POA: Diagnosis not present

## 2018-08-11 DIAGNOSIS — E1152 Type 2 diabetes mellitus with diabetic peripheral angiopathy with gangrene: Secondary | ICD-10-CM | POA: Diagnosis not present

## 2018-08-12 DIAGNOSIS — N183 Chronic kidney disease, stage 3 (moderate): Secondary | ICD-10-CM | POA: Diagnosis not present

## 2018-08-12 DIAGNOSIS — Z794 Long term (current) use of insulin: Secondary | ICD-10-CM | POA: Diagnosis not present

## 2018-08-12 DIAGNOSIS — E1122 Type 2 diabetes mellitus with diabetic chronic kidney disease: Secondary | ICD-10-CM | POA: Diagnosis not present

## 2018-08-21 ENCOUNTER — Ambulatory Visit (INDEPENDENT_AMBULATORY_CARE_PROVIDER_SITE_OTHER): Payer: PPO | Admitting: Vascular Surgery

## 2018-08-21 ENCOUNTER — Encounter (INDEPENDENT_AMBULATORY_CARE_PROVIDER_SITE_OTHER): Payer: PPO

## 2018-09-08 ENCOUNTER — Emergency Department: Payer: PPO | Admitting: Anesthesiology

## 2018-09-08 ENCOUNTER — Emergency Department: Payer: PPO

## 2018-09-08 ENCOUNTER — Inpatient Hospital Stay
Admission: EM | Admit: 2018-09-08 | Discharge: 2018-09-21 | DRG: 341 | Disposition: A | Discharge status: Home / Self Care | Payer: PPO | Attending: Surgery | Admitting: Surgery

## 2018-09-08 ENCOUNTER — Other Ambulatory Visit: Payer: Self-pay

## 2018-09-08 ENCOUNTER — Encounter: Payer: Self-pay | Admitting: Emergency Medicine

## 2018-09-08 ENCOUNTER — Encounter
Admission: EM | Disposition: A | Discharge status: Home / Self Care | Payer: Self-pay | Source: Home / Self Care | Attending: Surgery

## 2018-09-08 DIAGNOSIS — G4733 Obstructive sleep apnea (adult) (pediatric): Secondary | ICD-10-CM | POA: Diagnosis present

## 2018-09-08 DIAGNOSIS — N170 Acute kidney failure with tubular necrosis: Secondary | ICD-10-CM | POA: Diagnosis present

## 2018-09-08 DIAGNOSIS — R509 Fever, unspecified: Secondary | ICD-10-CM | POA: Diagnosis not present

## 2018-09-08 DIAGNOSIS — Z951 Presence of aortocoronary bypass graft: Secondary | ICD-10-CM

## 2018-09-08 DIAGNOSIS — E1151 Type 2 diabetes mellitus with diabetic peripheral angiopathy without gangrene: Secondary | ICD-10-CM | POA: Diagnosis present

## 2018-09-08 DIAGNOSIS — E785 Hyperlipidemia, unspecified: Secondary | ICD-10-CM | POA: Diagnosis present

## 2018-09-08 DIAGNOSIS — Z8349 Family history of other endocrine, nutritional and metabolic diseases: Secondary | ICD-10-CM | POA: Diagnosis not present

## 2018-09-08 DIAGNOSIS — I739 Peripheral vascular disease, unspecified: Secondary | ICD-10-CM | POA: Diagnosis present

## 2018-09-08 DIAGNOSIS — J45909 Unspecified asthma, uncomplicated: Secondary | ICD-10-CM | POA: Diagnosis present

## 2018-09-08 DIAGNOSIS — F0280 Dementia in other diseases classified elsewhere without behavioral disturbance: Secondary | ICD-10-CM | POA: Diagnosis not present

## 2018-09-08 DIAGNOSIS — I129 Hypertensive chronic kidney disease with stage 1 through stage 4 chronic kidney disease, or unspecified chronic kidney disease: Secondary | ICD-10-CM | POA: Diagnosis not present

## 2018-09-08 DIAGNOSIS — K37 Unspecified appendicitis: Secondary | ICD-10-CM | POA: Diagnosis present

## 2018-09-08 DIAGNOSIS — K529 Noninfective gastroenteritis and colitis, unspecified: Secondary | ICD-10-CM | POA: Diagnosis present

## 2018-09-08 DIAGNOSIS — G309 Alzheimer's disease, unspecified: Secondary | ICD-10-CM | POA: Diagnosis not present

## 2018-09-08 DIAGNOSIS — I5032 Chronic diastolic (congestive) heart failure: Secondary | ICD-10-CM | POA: Diagnosis present

## 2018-09-08 DIAGNOSIS — E119 Type 2 diabetes mellitus without complications: Secondary | ICD-10-CM | POA: Diagnosis not present

## 2018-09-08 DIAGNOSIS — Z79891 Long term (current) use of opiate analgesic: Secondary | ICD-10-CM

## 2018-09-08 DIAGNOSIS — N183 Chronic kidney disease, stage 3 (moderate): Secondary | ICD-10-CM | POA: Diagnosis present

## 2018-09-08 DIAGNOSIS — N141 Nephropathy induced by other drugs, medicaments and biological substances: Secondary | ICD-10-CM | POA: Diagnosis not present

## 2018-09-08 DIAGNOSIS — R1031 Right lower quadrant pain: Secondary | ICD-10-CM | POA: Diagnosis present

## 2018-09-08 DIAGNOSIS — R140 Abdominal distension (gaseous): Secondary | ICD-10-CM

## 2018-09-08 DIAGNOSIS — K219 Gastro-esophageal reflux disease without esophagitis: Secondary | ICD-10-CM | POA: Diagnosis present

## 2018-09-08 DIAGNOSIS — Z7982 Long term (current) use of aspirin: Secondary | ICD-10-CM | POA: Diagnosis not present

## 2018-09-08 DIAGNOSIS — E1122 Type 2 diabetes mellitus with diabetic chronic kidney disease: Secondary | ICD-10-CM | POA: Diagnosis present

## 2018-09-08 DIAGNOSIS — R339 Retention of urine, unspecified: Secondary | ICD-10-CM | POA: Diagnosis not present

## 2018-09-08 DIAGNOSIS — Z87891 Personal history of nicotine dependence: Secondary | ICD-10-CM | POA: Diagnosis not present

## 2018-09-08 DIAGNOSIS — I429 Cardiomyopathy, unspecified: Secondary | ICD-10-CM | POA: Diagnosis present

## 2018-09-08 DIAGNOSIS — E1140 Type 2 diabetes mellitus with diabetic neuropathy, unspecified: Secondary | ICD-10-CM | POA: Diagnosis present

## 2018-09-08 DIAGNOSIS — S01111A Laceration without foreign body of right eyelid and periocular area, initial encounter: Secondary | ICD-10-CM | POA: Diagnosis not present

## 2018-09-08 DIAGNOSIS — I5031 Acute diastolic (congestive) heart failure: Secondary | ICD-10-CM | POA: Diagnosis not present

## 2018-09-08 DIAGNOSIS — T508X5A Adverse effect of diagnostic agents, initial encounter: Secondary | ICD-10-CM | POA: Diagnosis not present

## 2018-09-08 DIAGNOSIS — D631 Anemia in chronic kidney disease: Secondary | ICD-10-CM | POA: Diagnosis not present

## 2018-09-08 DIAGNOSIS — I2510 Atherosclerotic heart disease of native coronary artery without angina pectoris: Secondary | ICD-10-CM | POA: Diagnosis present

## 2018-09-08 DIAGNOSIS — J449 Chronic obstructive pulmonary disease, unspecified: Secondary | ICD-10-CM | POA: Diagnosis not present

## 2018-09-08 DIAGNOSIS — I130 Hypertensive heart and chronic kidney disease with heart failure and stage 1 through stage 4 chronic kidney disease, or unspecified chronic kidney disease: Secondary | ICD-10-CM | POA: Diagnosis present

## 2018-09-08 DIAGNOSIS — K5900 Constipation, unspecified: Secondary | ICD-10-CM | POA: Diagnosis not present

## 2018-09-08 DIAGNOSIS — I10 Essential (primary) hypertension: Secondary | ICD-10-CM | POA: Diagnosis not present

## 2018-09-08 DIAGNOSIS — M48062 Spinal stenosis, lumbar region with neurogenic claudication: Secondary | ICD-10-CM | POA: Diagnosis not present

## 2018-09-08 DIAGNOSIS — R809 Proteinuria, unspecified: Secondary | ICD-10-CM | POA: Diagnosis present

## 2018-09-08 DIAGNOSIS — Z89422 Acquired absence of other left toe(s): Secondary | ICD-10-CM

## 2018-09-08 DIAGNOSIS — K3580 Unspecified acute appendicitis: Secondary | ICD-10-CM | POA: Diagnosis present

## 2018-09-08 DIAGNOSIS — Z833 Family history of diabetes mellitus: Secondary | ICD-10-CM

## 2018-09-08 DIAGNOSIS — K353 Acute appendicitis with localized peritonitis, without perforation or gangrene: Secondary | ICD-10-CM | POA: Diagnosis not present

## 2018-09-08 DIAGNOSIS — K8020 Calculus of gallbladder without cholecystitis without obstruction: Secondary | ICD-10-CM | POA: Diagnosis not present

## 2018-09-08 DIAGNOSIS — K567 Ileus, unspecified: Secondary | ICD-10-CM | POA: Diagnosis not present

## 2018-09-08 DIAGNOSIS — D638 Anemia in other chronic diseases classified elsewhere: Secondary | ICD-10-CM | POA: Diagnosis present

## 2018-09-08 DIAGNOSIS — R0902 Hypoxemia: Secondary | ICD-10-CM | POA: Diagnosis not present

## 2018-09-08 DIAGNOSIS — Z7902 Long term (current) use of antithrombotics/antiplatelets: Secondary | ICD-10-CM

## 2018-09-08 DIAGNOSIS — E781 Pure hyperglyceridemia: Secondary | ICD-10-CM | POA: Diagnosis not present

## 2018-09-08 DIAGNOSIS — Z79899 Other long term (current) drug therapy: Secondary | ICD-10-CM | POA: Diagnosis not present

## 2018-09-08 DIAGNOSIS — Z8249 Family history of ischemic heart disease and other diseases of the circulatory system: Secondary | ICD-10-CM

## 2018-09-08 DIAGNOSIS — Z792 Long term (current) use of antibiotics: Secondary | ICD-10-CM

## 2018-09-08 DIAGNOSIS — N179 Acute kidney failure, unspecified: Secondary | ICD-10-CM | POA: Diagnosis not present

## 2018-09-08 DIAGNOSIS — I252 Old myocardial infarction: Secondary | ICD-10-CM | POA: Diagnosis not present

## 2018-09-08 DIAGNOSIS — Z794 Long term (current) use of insulin: Secondary | ICD-10-CM

## 2018-09-08 HISTORY — PX: LAPAROSCOPIC APPENDECTOMY: SHX408

## 2018-09-08 HISTORY — DX: Unspecified appendicitis: K37

## 2018-09-08 LAB — COMPREHENSIVE METABOLIC PANEL
ALT: 12 U/L (ref 0–44)
AST: 18 U/L (ref 15–41)
Albumin: 4 g/dL (ref 3.5–5.0)
Alkaline Phosphatase: 76 U/L (ref 38–126)
Anion gap: 12 (ref 5–15)
BUN: 37 mg/dL — ABNORMAL HIGH (ref 8–23)
CO2: 23 mmol/L (ref 22–32)
Calcium: 8.6 mg/dL — ABNORMAL LOW (ref 8.9–10.3)
Chloride: 108 mmol/L (ref 98–111)
Creatinine, Ser: 1.89 mg/dL — ABNORMAL HIGH (ref 0.61–1.24)
GFR calc Af Amer: 43 mL/min — ABNORMAL LOW (ref >60)
GFR calc non Af Amer: 37 mL/min — ABNORMAL LOW (ref >60)
Glucose, Bld: 153 mg/dL — ABNORMAL HIGH (ref 70–99)
Potassium: 4.1 mmol/L (ref 3.5–5.1)
Sodium: 143 mmol/L (ref 135–145)
Total Bilirubin: 1 mg/dL (ref 0.3–1.2)
Total Protein: 7.4 g/dL (ref 6.5–8.1)

## 2018-09-08 LAB — CBC
HCT: 34.4 % — ABNORMAL LOW (ref 39.0–52.0)
Hemoglobin: 11.3 g/dL — ABNORMAL LOW (ref 13.0–17.0)
MCH: 27.7 pg (ref 26.0–34.0)
MCHC: 32.8 g/dL (ref 30.0–36.0)
MCV: 84.3 fL (ref 80.0–100.0)
Platelets: 157 10*3/uL (ref 150–400)
RBC: 4.08 MIL/uL — ABNORMAL LOW (ref 4.22–5.81)
RDW: 14.7 % (ref 11.5–15.5)
WBC: 13.9 10*3/uL — ABNORMAL HIGH (ref 4.0–10.5)
nRBC: 0 % (ref 0.0–0.2)

## 2018-09-08 LAB — GLUCOSE, CAPILLARY
Glucose-Capillary: 154 mg/dL — ABNORMAL HIGH (ref 70–99)
Glucose-Capillary: 156 mg/dL — ABNORMAL HIGH (ref 70–99)

## 2018-09-08 LAB — LIPASE, BLOOD: Lipase: 28 U/L (ref 11–51)

## 2018-09-08 IMAGING — CT CT ABDOMEN AND PELVIS WITH CONTRAST
2 of 5 series · 15 of 46 positions shown, 17 images · IV contrast (APPLIED)
Comparison: None.

CLINICAL DATA: 62-year-old male with a history of right lower
quadrant pain

EXAM:
CT ABDOMEN AND PELVIS WITH CONTRAST
TECHNIQUE: Multidetector CT imaging of the abdomen and pelvis was performed
using the standard protocol following bolus administration of
intravenous contrast.
CONTRAST:  100mL OMNIPAQUE IOHEXOL 300 MG/ML  SOLN

[Series 2: axial st · axial · 0.77mm/px · z∈[-558,-102]mm · 12 of 103 slices shown, 14 images]
[im 6/103  soft-tissue]
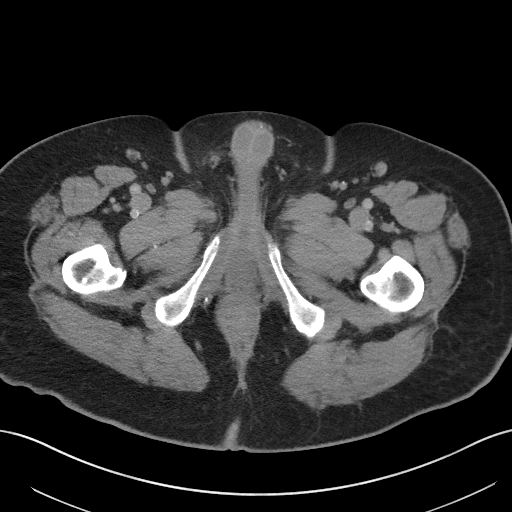
[im 6/103  bone]
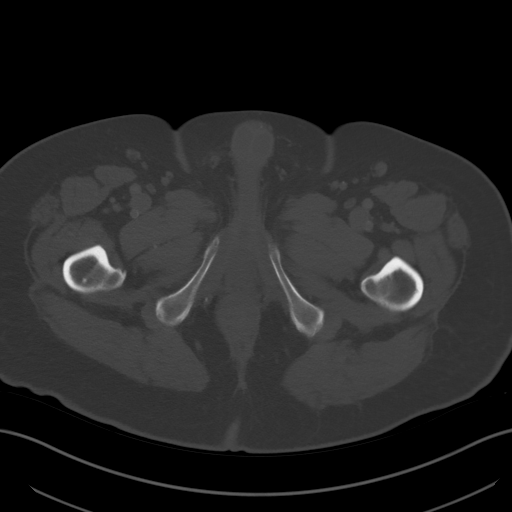
[im 16/103  soft-tissue]
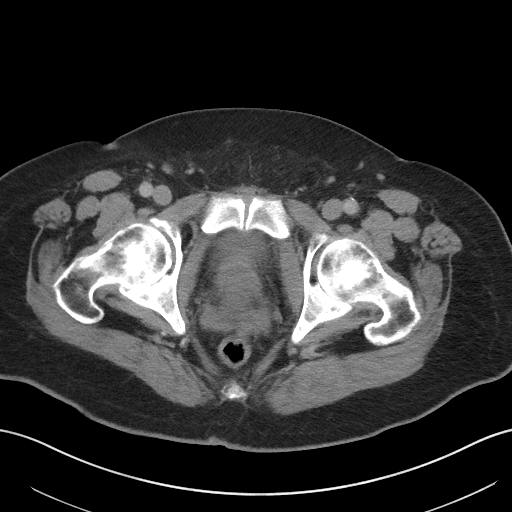
[im 21/103  soft-tissue]
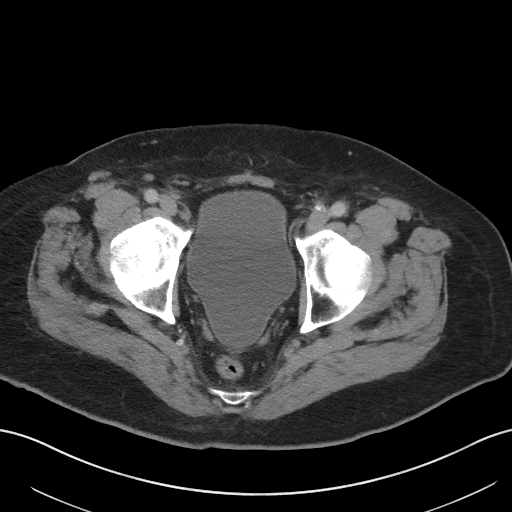
[im 31/103  soft-tissue]
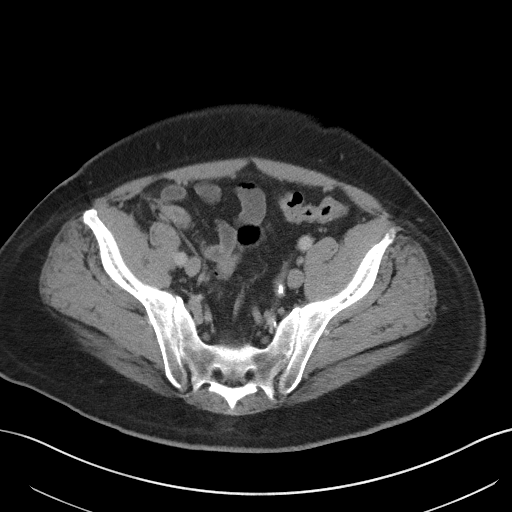
[im 41/103  soft-tissue]
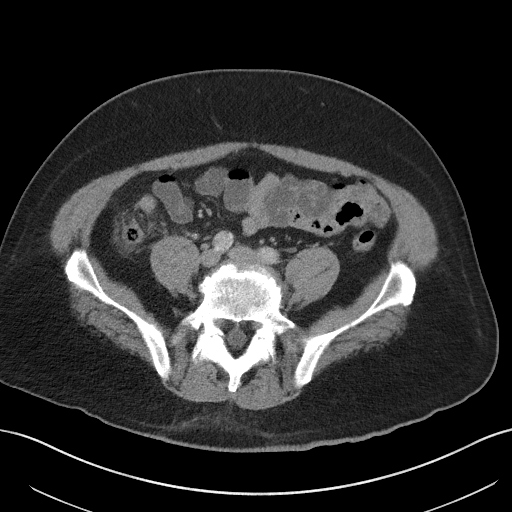
[im 46/103  soft-tissue]
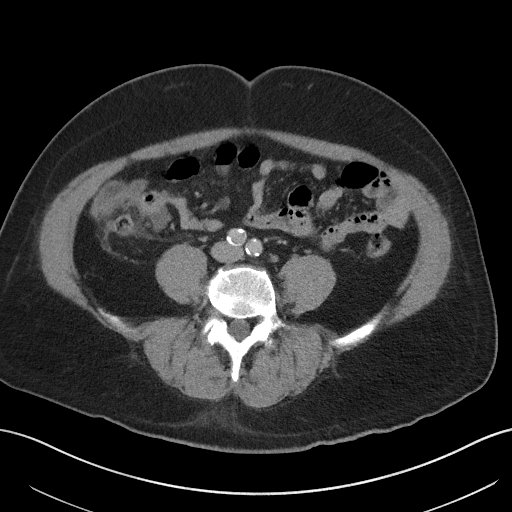
[im 57/103  soft-tissue]
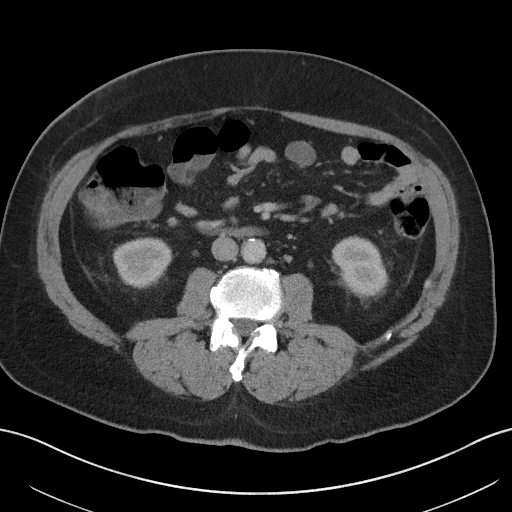
[im 62/103  soft-tissue]
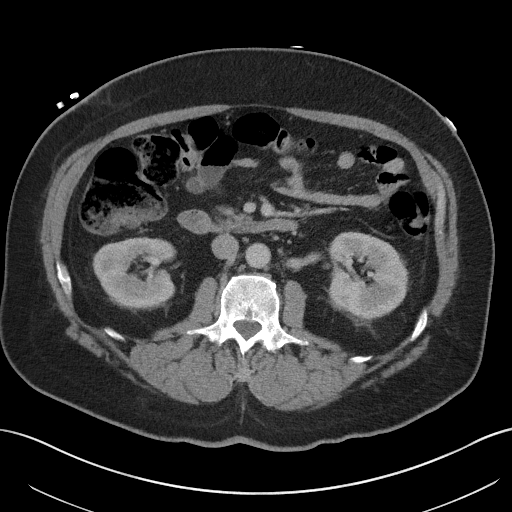
[im 72/103  soft-tissue]
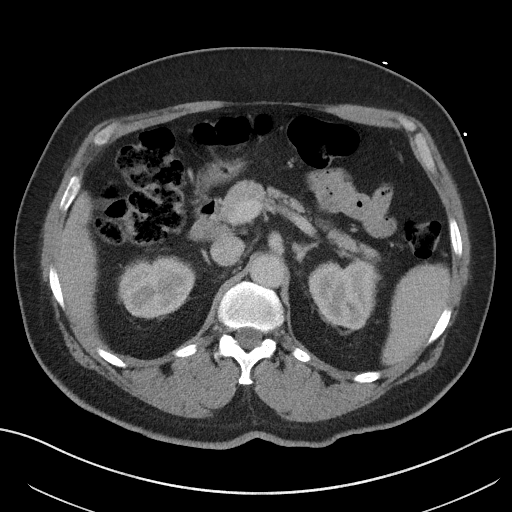
[im 72/103  bone]
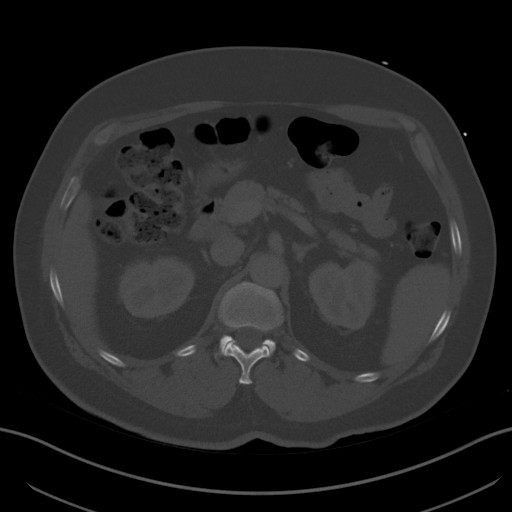
[im 82/103  soft-tissue]
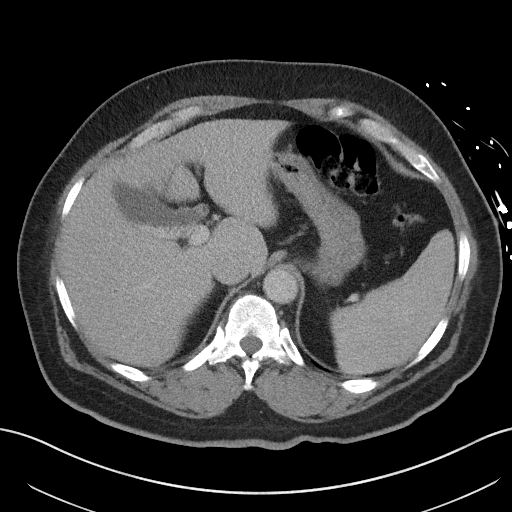
[im 87/103  soft-tissue]
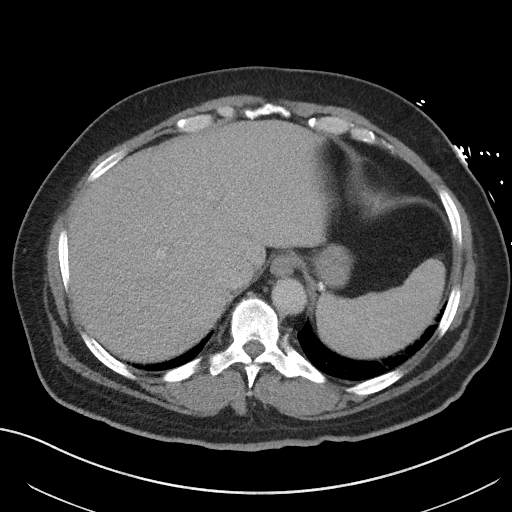
[im 97/103  soft-tissue]
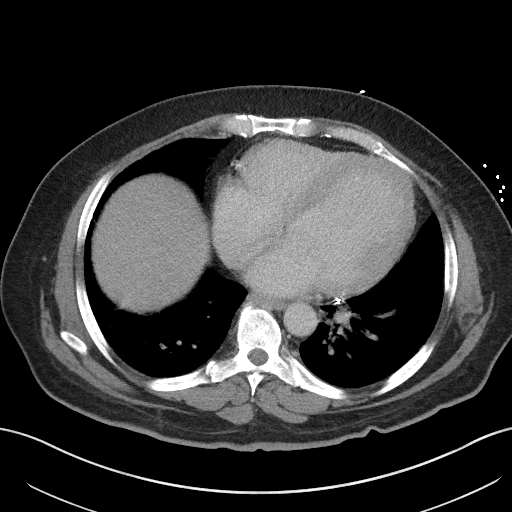

[Series 5: coronal st · coronal · 0.73mm/px · 3 of 101 slices shown]
[im 34/101  soft-tissue]
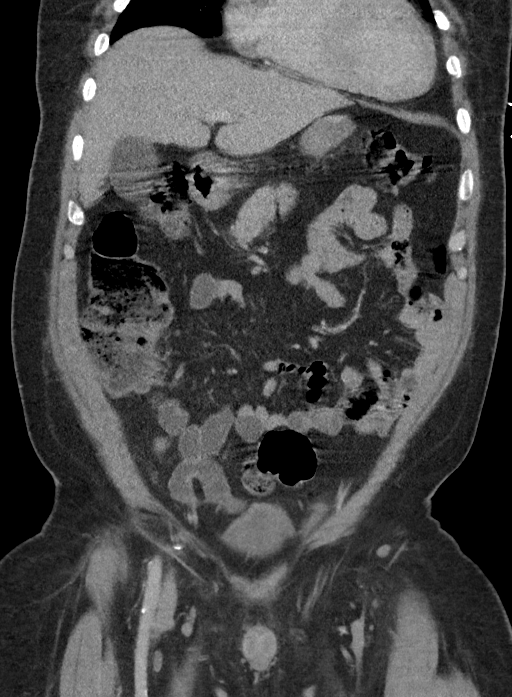
[im 45/101  soft-tissue]
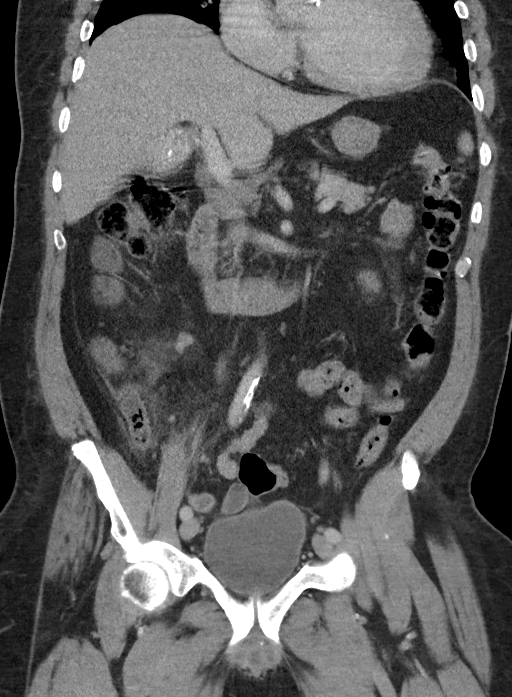
[im 56/101  soft-tissue]
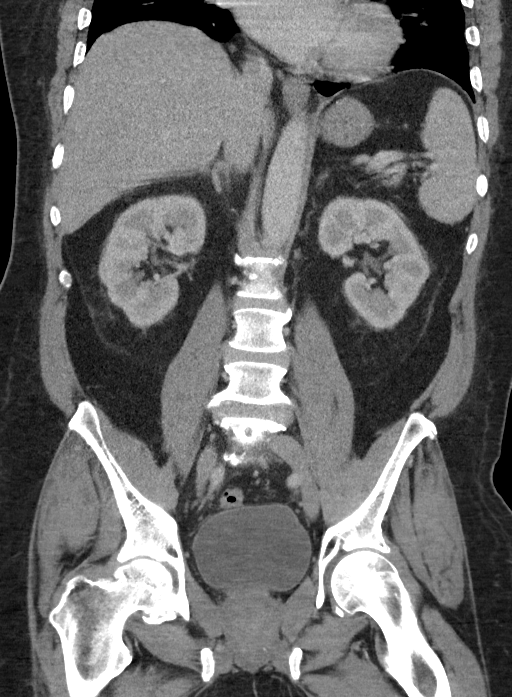

[15 of 46 positions shown; findings below may reference images not displayed]

FINDINGS: Lower chest: No acute finding of the lower chest. 6 mm nodule at the
periphery of the left lower lobe (image 11 of series 4).

Hepatobiliary: Unremarkable appearance of liver. Cholelithiasis
without associated inflammatory changes.

Pancreas: Unremarkable

Spleen: Unremarkable

Adrenals/Urinary Tract: Unremarkable appearance of the adrenal
glands. No evidence of hydronephrosis of the right or left kidney.
No nephrolithiasis. Unremarkable course of the bilateral ureters.
Unremarkable appearance of the urinary bladder.

Stomach/Bowel: Unremarkable stomach. Proximal small bowel
unremarkable with no focal wall thickening or transition point.
Majority of distal small bowel is unremarkable. There is
circumferential wall enhancement at the terminal ileum just above
the IC valve. Minimal inflammatory changes in the adjacent fat with
trace free fluid.

Inflammatory changes surround the appendix with fecalized
material/gas within the lumen. No evidence of perforation. No
calcified appendicoliths.

Colon is relatively unremarkable with mild stool burden. Colonic
diverticula without associated inflammatory changes.

Vascular/Lymphatic: Atherosclerotic calcifications of the abdominal
aorta and the bilateral iliac arteries. No adenopathy.

Reproductive: Unremarkable pelvic structures

Other: No abdominal wall hernia.

Musculoskeletal: Bilateral L5 pars defect with grade 1
anterolisthesis and associated degenerative disc disease of L5-S1.
Vacuum disc phenomenon at this level. No acute displaced fracture.
Degenerative changes of the hips.
IMPRESSION: Acute appendicitis, uncomplicated. There is a presumed reactive
ileitis of the terminal ileum.

These results were discussed by telephone at the time of
interpretation on [DATE] at [DATE] with Dr. HAGE.

Cholelithiasis without evidence of acute cholecystitis.

Nodule at the left lung base, 6 mm. Non-contrast chest CT at 6-12
months is recommended. If the nodule is stable at time of repeat CT,
then future CT at 18-24 months (from today's scan) is considered
optional for low-risk patients, but is recommended for high-risk
patients. This recommendation follows the consensus statement:
Guidelines for Management of Incidental Pulmonary Nodules Detected

## 2018-09-08 SURGERY — APPENDECTOMY, LAPAROSCOPIC
Anesthesia: General

## 2018-09-08 MED ORDER — FENTANYL CITRATE (PF) 100 MCG/2ML IJ SOLN
INTRAMUSCULAR | Status: AC
  Filled 2018-09-08: qty 2

## 2018-09-08 MED ORDER — DEXMEDETOMIDINE HCL 200 MCG/2ML IV SOLN
INTRAVENOUS | Status: DC | PRN
  Administered 2018-09-08: 12 ug via INTRAVENOUS

## 2018-09-08 MED ORDER — SODIUM CHLORIDE 0.9 % IV SOLN
Freq: Once | INTRAVENOUS | Status: AC
  Administered 2018-09-08: 15:00:00 via INTRAVENOUS

## 2018-09-08 MED ORDER — ISOSORBIDE MONONITRATE ER 30 MG PO TB24
120.0000 mg | ORAL_TABLET | Freq: Every day | ORAL | Status: DC
  Administered 2018-09-10: 10:00:00 120 mg via ORAL
  Filled 2018-09-08 (×2): qty 4

## 2018-09-08 MED ORDER — ONDANSETRON HCL 4 MG/2ML IJ SOLN
INTRAMUSCULAR | Status: DC | PRN
  Administered 2018-09-08: 4 mg via INTRAVENOUS

## 2018-09-08 MED ORDER — PIPERACILLIN-TAZOBACTAM 3.375 G IVPB 30 MIN
3.3750 g | Freq: Once | INTRAVENOUS | Status: AC
  Administered 2018-09-08: 3.375 g via INTRAVENOUS
  Filled 2018-09-08: qty 50

## 2018-09-08 MED ORDER — NITROGLYCERIN 0.4 MG SL SUBL: 0.4000 mg | SUBLINGUAL_TABLET | SUBLINGUAL | Status: DC | PRN

## 2018-09-08 MED ORDER — PIPERACILLIN-TAZOBACTAM 3.375 G IVPB
3.3750 g | Freq: Three times a day (TID) | INTRAVENOUS | Status: DC
  Administered 2018-09-08 – 2018-09-09 (×2): 3.375 g via INTRAVENOUS
  Filled 2018-09-08 (×2): qty 50

## 2018-09-08 MED ORDER — HYDRALAZINE HCL 20 MG/ML IJ SOLN
10.0000 mg | INTRAMUSCULAR | Status: DC | PRN
  Administered 2018-09-08 – 2018-09-15 (×3): 10 mg via INTRAVENOUS
  Filled 2018-09-08 (×3): qty 1

## 2018-09-08 MED ORDER — LACTATED RINGERS IV SOLN
INTRAVENOUS | Status: DC | PRN
  Administered 2018-09-08 (×2): via INTRAVENOUS

## 2018-09-08 MED ORDER — LIDOCAINE HCL (PF) 2 % IJ SOLN
INTRAMUSCULAR | Status: AC
  Filled 2018-09-08: qty 10

## 2018-09-08 MED ORDER — LISINOPRIL 20 MG PO TABS: 40.0000 mg | ORAL_TABLET | Freq: Every day | ORAL | Status: DC

## 2018-09-08 MED ORDER — TRAMADOL HCL 50 MG PO TABS
50.0000 mg | ORAL_TABLET | Freq: Four times a day (QID) | ORAL | Status: DC | PRN
  Administered 2018-09-17: 50 mg via ORAL
  Filled 2018-09-08: qty 1

## 2018-09-08 MED ORDER — PROCHLORPERAZINE EDISYLATE 10 MG/2ML IJ SOLN
5.0000 mg | Freq: Four times a day (QID) | INTRAMUSCULAR | Status: DC | PRN
  Administered 2018-09-08 – 2018-09-10 (×2): 10 mg via INTRAVENOUS
  Filled 2018-09-08 (×3): qty 2

## 2018-09-08 MED ORDER — EVICEL 5 ML EX KIT
PACK | CUTANEOUS | Status: AC
  Filled 2018-09-08: qty 1

## 2018-09-08 MED ORDER — CARVEDILOL 6.25 MG PO TABS
6.2500 mg | ORAL_TABLET | Freq: Two times a day (BID) | ORAL | Status: DC
  Administered 2018-09-08 – 2018-09-17 (×19): 6.25 mg via ORAL
  Filled 2018-09-08 (×19): qty 1

## 2018-09-08 MED ORDER — OXYCODONE HCL 5 MG PO TABS: 5.0000 mg | ORAL_TABLET | Freq: Once | ORAL | Status: DC | PRN

## 2018-09-08 MED ORDER — ROCURONIUM BROMIDE 50 MG/5ML IV SOLN
INTRAVENOUS | Status: AC
  Filled 2018-09-08: qty 1

## 2018-09-08 MED ORDER — ACETAMINOPHEN 10 MG/ML IV SOLN
INTRAVENOUS | Status: DC | PRN
  Administered 2018-09-08: 1000 mg via INTRAVENOUS

## 2018-09-08 MED ORDER — EVICEL 5 ML EX KIT
PACK | CUTANEOUS | Status: DC | PRN
  Administered 2018-09-08: 5 mL

## 2018-09-08 MED ORDER — MIDAZOLAM HCL 2 MG/2ML IJ SOLN
INTRAMUSCULAR | Status: AC
  Filled 2018-09-08: qty 2

## 2018-09-08 MED ORDER — BUPIVACAINE-EPINEPHRINE 0.25% -1:200000 IJ SOLN
INTRAMUSCULAR | Status: DC | PRN
  Administered 2018-09-08: 30 mL

## 2018-09-08 MED ORDER — PANTOPRAZOLE SODIUM 40 MG IV SOLR
40.0000 mg | Freq: Every day | INTRAVENOUS | Status: DC
  Administered 2018-09-08 – 2018-09-12 (×5): 40 mg via INTRAVENOUS
  Filled 2018-09-08 (×6): qty 40

## 2018-09-08 MED ORDER — SODIUM CHLORIDE 0.9 % IV SOLN
INTRAVENOUS | Status: DC
  Administered 2018-09-08 – 2018-09-09 (×4): via INTRAVENOUS

## 2018-09-08 MED ORDER — SODIUM CHLORIDE 0.9% FLUSH
3.0000 mL | Freq: Once | INTRAVENOUS | Status: AC
  Administered 2018-09-08: 13:00:00 3 mL via INTRAVENOUS

## 2018-09-08 MED ORDER — SODIUM CHLORIDE 0.9 % IV SOLN
INTRAVENOUS | Status: DC | PRN
  Administered 2018-09-08 – 2018-09-20 (×9): 250 mL via INTRAVENOUS
  Administered 2018-09-21: 100 mL via INTRAVENOUS

## 2018-09-08 MED ORDER — PROPOFOL 10 MG/ML IV BOLUS
INTRAVENOUS | Status: AC
  Filled 2018-09-08: qty 20

## 2018-09-08 MED ORDER — ACETAMINOPHEN 500 MG PO TABS
1000.0000 mg | ORAL_TABLET | Freq: Four times a day (QID) | ORAL | Status: DC
  Administered 2018-09-08 – 2018-09-21 (×45): 1000 mg via ORAL
  Filled 2018-09-08 (×48): qty 2

## 2018-09-08 MED ORDER — IOHEXOL 300 MG/ML  SOLN
80.0000 mL | Freq: Once | INTRAMUSCULAR | Status: AC | PRN
  Administered 2018-09-08: 100 mL via INTRAVENOUS

## 2018-09-08 MED ORDER — SUGAMMADEX SODIUM 500 MG/5ML IV SOLN
INTRAVENOUS | Status: DC | PRN
  Administered 2018-09-08: 220 mg via INTRAVENOUS

## 2018-09-08 MED ORDER — ONDANSETRON HCL 4 MG/2ML IJ SOLN
4.0000 mg | Freq: Four times a day (QID) | INTRAMUSCULAR | Status: DC | PRN
  Administered 2018-09-08 – 2018-09-14 (×3): 4 mg via INTRAVENOUS
  Filled 2018-09-08 (×3): qty 2

## 2018-09-08 MED ORDER — MIDAZOLAM HCL 2 MG/2ML IJ SOLN
INTRAMUSCULAR | Status: DC | PRN
  Administered 2018-09-08: 1 mg via INTRAVENOUS

## 2018-09-08 MED ORDER — SUCCINYLCHOLINE CHLORIDE 20 MG/ML IJ SOLN
INTRAMUSCULAR | Status: DC | PRN
  Administered 2018-09-08: 200 mg via INTRAVENOUS

## 2018-09-08 MED ORDER — OXYCODONE HCL 5 MG/5ML PO SOLN: 5.0000 mg | Freq: Once | ORAL | Status: DC | PRN

## 2018-09-08 MED ORDER — GABAPENTIN 300 MG PO CAPS
600.0000 mg | ORAL_CAPSULE | Freq: Two times a day (BID) | ORAL | Status: DC
  Administered 2018-09-09 – 2018-09-10 (×4): 600 mg via ORAL
  Filled 2018-09-08 (×5): qty 2

## 2018-09-08 MED ORDER — SEVOFLURANE IN SOLN
RESPIRATORY_TRACT | Status: AC
  Filled 2018-09-08: qty 250

## 2018-09-08 MED ORDER — PROMETHAZINE HCL 25 MG/ML IJ SOLN
6.2500 mg | Freq: Once | INTRAMUSCULAR | Status: AC
  Administered 2018-09-08: 18:00:00 6.25 mg via INTRAVENOUS

## 2018-09-08 MED ORDER — PROPOFOL 10 MG/ML IV BOLUS
INTRAVENOUS | Status: DC | PRN
  Administered 2018-09-08: 150 mg via INTRAVENOUS
  Administered 2018-09-08: 60 mg via INTRAVENOUS

## 2018-09-08 MED ORDER — PROCHLORPERAZINE MALEATE 10 MG PO TABS
10.0000 mg | ORAL_TABLET | Freq: Four times a day (QID) | ORAL | Status: DC | PRN
  Administered 2018-09-18: 10:00:00 10 mg via ORAL
  Filled 2018-09-08 (×2): qty 1

## 2018-09-08 MED ORDER — SUCCINYLCHOLINE CHLORIDE 20 MG/ML IJ SOLN
INTRAMUSCULAR | Status: AC
  Filled 2018-09-08: qty 1

## 2018-09-08 MED ORDER — SPIRONOLACTONE 25 MG PO TABS
25.0000 mg | ORAL_TABLET | Freq: Every day | ORAL | Status: DC
  Administered 2018-09-09: 09:00:00 25 mg via ORAL
  Filled 2018-09-08: qty 1

## 2018-09-08 MED ORDER — PROMETHAZINE HCL 25 MG/ML IJ SOLN
INTRAMUSCULAR | Status: AC
  Filled 2018-09-08: qty 1

## 2018-09-08 MED ORDER — CLONIDINE HCL 0.1 MG PO TABS: 0.3000 mg | ORAL_TABLET | Freq: Two times a day (BID) | ORAL | Status: DC

## 2018-09-08 MED ORDER — FENTANYL CITRATE (PF) 100 MCG/2ML IJ SOLN
INTRAMUSCULAR | Status: AC
  Administered 2018-09-08: 18:00:00 25 ug via INTRAVENOUS
  Filled 2018-09-08: qty 2

## 2018-09-08 MED ORDER — INSULIN DETEMIR 100 UNIT/ML ~~LOC~~ SOLN
80.0000 [IU] | Freq: Every day | SUBCUTANEOUS | Status: DC
  Administered 2018-09-08: 23:00:00 80 [IU] via SUBCUTANEOUS
  Filled 2018-09-08 (×4): qty 0.8

## 2018-09-08 MED ORDER — OXYCODONE HCL 5 MG PO TABS
5.0000 mg | ORAL_TABLET | ORAL | Status: DC | PRN
  Administered 2018-09-08 – 2018-09-21 (×9): 10 mg via ORAL
  Filled 2018-09-08 (×9): qty 2

## 2018-09-08 MED ORDER — HYDROMORPHONE HCL 1 MG/ML IJ SOLN
0.5000 mg | INTRAMUSCULAR | Status: DC | PRN
  Administered 2018-09-08 – 2018-09-17 (×5): 0.5 mg via INTRAVENOUS
  Filled 2018-09-08 (×5): qty 0.5

## 2018-09-08 MED ORDER — SODIUM CHLORIDE FLUSH 0.9 % IV SOLN
INTRAVENOUS | Status: AC
  Filled 2018-09-08: qty 10

## 2018-09-08 MED ORDER — ONDANSETRON 4 MG PO TBDP: 4.0000 mg | ORAL_TABLET | Freq: Four times a day (QID) | ORAL | Status: DC | PRN

## 2018-09-08 MED ORDER — ACETAMINOPHEN 10 MG/ML IV SOLN
INTRAVENOUS | Status: AC
  Filled 2018-09-08: qty 100

## 2018-09-08 MED ORDER — FENTANYL CITRATE (PF) 100 MCG/2ML IJ SOLN
INTRAMUSCULAR | Status: DC | PRN
  Administered 2018-09-08 (×3): 50 ug via INTRAVENOUS

## 2018-09-08 MED ORDER — FENTANYL CITRATE (PF) 100 MCG/2ML IJ SOLN
25.0000 ug | INTRAMUSCULAR | Status: AC | PRN
  Administered 2018-09-08 (×6): 25 ug via INTRAVENOUS

## 2018-09-08 MED ORDER — ROCURONIUM BROMIDE 100 MG/10ML IV SOLN
INTRAVENOUS | Status: DC | PRN
  Administered 2018-09-08: 10 mg via INTRAVENOUS
  Administered 2018-09-08: 40 mg via INTRAVENOUS

## 2018-09-08 SURGICAL SUPPLY — 43 items
ADH SKN CLS APL DERMABOND .7 (GAUZE/BANDAGES/DRESSINGS) ×1
APL PRP STRL LF DISP 70% ISPRP (MISCELLANEOUS) ×1
APPLIER CLIP 5 13 M/L LIGAMAX5 (MISCELLANEOUS) ×3
APR CLP MED LRG 5 ANG JAW (MISCELLANEOUS) ×1
BAG SPEC RTRVL LRG 6X4 10 (ENDOMECHANICALS) ×1
BLADE CLIPPER SURG (BLADE) ×3 IMPLANT
CANISTER SUCT 1200ML W/VALVE (MISCELLANEOUS) ×3 IMPLANT
CHLORAPREP W/TINT 26 (MISCELLANEOUS) ×3 IMPLANT
CLIP APPLIE 5 13 M/L LIGAMAX5 (MISCELLANEOUS) ×1 IMPLANT
COVER WAND RF STERILE (DRAPES) ×1 IMPLANT
CUTTER FLEX LINEAR 45M (STAPLE) ×3 IMPLANT
DERMABOND ADVANCED (GAUZE/BANDAGES/DRESSINGS) ×2
DERMABOND ADVANCED .7 DNX12 (GAUZE/BANDAGES/DRESSINGS) ×1 IMPLANT
ELECT CAUTERY BLADE 6.4 (BLADE) ×3 IMPLANT
ELECT REM PT RETURN 9FT ADLT (ELECTROSURGICAL) ×3
ELECTRODE REM PT RTRN 9FT ADLT (ELECTROSURGICAL) ×1 IMPLANT
GLOVE BIO SURGEON STRL SZ7 (GLOVE) ×3 IMPLANT
GOWN STRL REUS W/ TWL LRG LVL3 (GOWN DISPOSABLE) ×2 IMPLANT
GOWN STRL REUS W/TWL LRG LVL3 (GOWN DISPOSABLE) ×6
IRRIGATION STRYKERFLOW (MISCELLANEOUS) ×1 IMPLANT
IRRIGATOR STRYKERFLOW (MISCELLANEOUS) ×3
IV NS 1000ML (IV SOLUTION) ×3
IV NS 1000ML BAXH (IV SOLUTION) ×1 IMPLANT
NEEDLE HYPO 22GX1.5 SAFETY (NEEDLE) ×3 IMPLANT
NS IRRIG 500ML POUR BTL (IV SOLUTION) ×3 IMPLANT
PACK LAP CHOLECYSTECTOMY (MISCELLANEOUS) ×3 IMPLANT
PENCIL ELECTRO HAND CTR (MISCELLANEOUS) ×3 IMPLANT
POUCH SPECIMEN RETRIEVAL 10MM (ENDOMECHANICALS) ×3 IMPLANT
RELOAD 45 VASCULAR/THIN (ENDOMECHANICALS) ×3 IMPLANT
RELOAD STAPLE 45 2.5 WHT GRN (ENDOMECHANICALS) ×1 IMPLANT
RELOAD STAPLE 45 3.5 BLU ETS (ENDOMECHANICALS) ×1 IMPLANT
RELOAD STAPLE TA45 3.5 REG BLU (ENDOMECHANICALS) ×3 IMPLANT
SCISSORS METZENBAUM CVD 33 (INSTRUMENTS) IMPLANT
SHEARS HARMONIC ACE PLUS 36CM (ENDOMECHANICALS) ×3 IMPLANT
SLEEVE ENDOPATH XCEL 5M (ENDOMECHANICALS) ×3 IMPLANT
SPONGE LAP 18X18 RF (DISPOSABLE) ×3 IMPLANT
SUT MNCRL AB 4-0 PS2 18 (SUTURE) ×3 IMPLANT
SUT VICRYL 0 AB UR-6 (SUTURE) ×6 IMPLANT
SYR 20CC LL (SYRINGE) ×3 IMPLANT
TRAY FOLEY MTR SLVR 16FR STAT (SET/KITS/TRAYS/PACK) ×1 IMPLANT
TROCAR XCEL BLUNT TIP 100MML (ENDOMECHANICALS) ×3 IMPLANT
TROCAR XCEL NON-BLD 5MMX100MML (ENDOMECHANICALS) ×6 IMPLANT
TUBING EVAC SMOKE HEATED PNEUM (TUBING) ×3 IMPLANT

## 2018-09-08 NOTE — ED Notes (Signed)
Patient transported to CT 

## 2018-09-08 NOTE — Op Note (Signed)
laparascopic appendectomy   [name] Date of operation:  09/08/2018  Indications: The patient presented with a history of  abdominal pain. Workup has revealed findings consistent with acute appendicitis.  Pre-operative Diagnosis: Acute appendicitis   Post-operative Diagnosis: Same  Surgeon: Caroleen Hamman, MD, FACS  Anesthesia: General with endotracheal tube  Findings: Severe acute appendicitis, non perforated  Estimated Blood Loss: 20cc         Specimens: appendix         Complications:  none  Procedure Details  The patient was seen again in the preop area. The options of surgery versus observation were reviewed with the patient and/or family. The risks of bleeding, infection, recurrence of symptoms, negative laparoscopy, potential for an open procedure, bowel injury, abscess or infection, were all reviewed as well. The patient was taken to Operating Room, identified as Phillip [name] and the procedure verified as laparoscopic appendectomy. A Time Out was held and the above information confirmed.  The patient was placed in the supine position and general anesthesia was induced.  Antibiotic prophylaxis was administered and VT E prophylaxis was in place. A Foley catheter was placed by the nursing staff.   The abdomen was prepped and draped in a sterile fashion. An infraumbilical incision was made. A cutdown technique was used to enter the abdominal cavity. Two vicryl stitches were placed on the fascia and a Hasson trocar inserted. Pneumoperitoneum obtained. Two 5 mm ports were placed under direct visualization.   The appendix was identified and found to be severely and  acutely inflamed  The appendix was carefully dissected. The mesoappendix was divided with Harmonic scalpel.  The base of the appendix was dissected out and divided with a standard load Endo GIA.The appendix was placed in a Endo Catch bag and removed via the Hasson port. Some scant non pulsatile oozing was seen  from the staple line and Evicel was used to reinforce the staple line. No further evidence of oozing was observed after using the hemostatic agent.  The right lower quadrant and pelvis was then irrigated with  normal saline which was aspirated. Inspection  failed to identify any additional bleeding and there were no signs of bowel injury. Again the right lower quadrant was inspected there was no sign of bleeding or bowel injury therefore pneumoperitoneum was released, all ports were removed.  The umbilical fascia was closed with 0 Vicryl interrupted sutures and the skin incisions were approximated with subcuticular 4-0 Monocryl. Dermabond was placed The patient tolerated the procedure well, there were no complications. The sponge lap and needle count were correct at the end of the procedure.  The patient was taken to the recovery room in stable condition to be admitted for continued care.    Caroleen Hamman, MD FACS

## 2018-09-08 NOTE — ED Provider Notes (Signed)
[facility] Emergency Department Provider Note       Time seen: ----------------------------------------- 12:49 PM on 09/08/2018 -----------------------------------------   I have reviewed the triage vital signs and the nursing notes.  HISTORY   Chief Complaint Abdominal Pain   HPI Phillip [name] is a 63 y.o. male with a history of asthma, CHF, coronary artery disease, diabetes, GERD, hypertension, kidney disease who presents to the ED for right lower quadrant pain that began Sunday night.  Patient denies nausea, vomiting or diarrhea.  Patient reports pain is worse with movement, reports fever was 102.5 and fever started today.  He feels weak and tired but denies other complaints.  Past Medical History:  Diagnosis Date  . Asthma   . CHF (congestive heart failure) ([place])   . Coronary artery disease   . Diabetes mellitus without complication ([place])   . Foot ulcer ([place])    left foot  . GERD (gastroesophageal reflux disease)   . Heart murmur   . Hypertension   . Kidney insufficiency   . Peripheral vascular disease [facility])     Patient Active Problem List   Diagnosis Date Noted  . Foot ulcer ([place]) 04/24/2018  . Diabetes mellitus without complication ([place]) 16/03/9603  . Hypertension 02/24/2018  . CHF (congestive heart failure) ([place]) 02/24/2018  . Atherosclerosis of native arteries of the extremities with ulceration ([place]) 02/24/2018    Past Surgical History:  Procedure Laterality Date  . AMPUTATION TOE Left 03/13/2018   Procedure: AMPUTATION TOE-MPJ;  Surgeon: Samara Deist, DPM;  Location: [facility] ORS;  Service: Podiatry;  Laterality: Left;  . ANGIOPLASTY    . CARDIAC CATHETERIZATION    . CARDIAC SURGERY     heart bypass (4)  . CORONARY ARTERY BYPASS GRAFT    . LOWER EXTREMITY ANGIOGRAPHY Left 03/02/2018   Procedure: LOWER EXTREMITY ANGIOGRAPHY;  Surgeon: Algernon Huxley, MD;  Location: [place] CV LAB;  Service: Cardiovascular;  Laterality: Left;   . ROTATOR CUFF REPAIR Left     Allergies Patient has no known allergies.  Social History Social History   Tobacco Use  . Smoking status: Former Smoker    Last attempt to quit: 2003    Years since quitting: 17.2  . Smokeless tobacco: Never Used  Substance Use Topics  . Alcohol use: Not Currently    Comment: 2003  . Drug use: Not Currently    Types: Marijuana   Review of Systems Constitutional: Negative for fever. Cardiovascular: Negative for chest pain. Respiratory: Negative for shortness of breath. Gastrointestinal: Positive for abdominal pain, negative for vomiting or diarrhea Musculoskeletal: Negative for back pain. Skin: Negative for rash. Neurological: Negative for headaches, focal weakness or numbness.  All systems negative/normal/unremarkable except as stated in the HPI  ____________________________________________   PHYSICAL EXAM:  VITAL SIGNS: ED Triage Vitals  Enc Vitals Group     BP 09/08/18 1237 (!) 202/79     Pulse Rate 09/08/18 1237 90     Resp 09/08/18 1237 18     Temp 09/08/18 1237 (!) 102.5 F (39.2 C)     Temp Source 09/08/18 1237 Oral     SpO2 09/08/18 1237 97 %     Weight 09/08/18 1239 243 lb (110.2 kg)     Height 09/08/18 1239 6\' 1"  (1.854 m)     Head Circumference --      Peak Flow --      Pain Score 09/08/18 1238 7     Pain Loc --  Pain Edu? --      Excl. in [place]? --    Constitutional: Alert and oriented. Well appearing and in no distress. Eyes: Conjunctivae are normal. Normal extraocular movements. Cardiovascular: Normal rate, regular rhythm. No murmurs, rubs, or gallops. Respiratory: Normal respiratory effort without tachypnea nor retractions. Breath sounds are clear and equal bilaterally. No wheezes/rales/rhonchi. Gastrointestinal: Right lower quadrant tenderness, questionable guarding and rebound.  Hypoactive bowel sounds Musculoskeletal: Nontender with normal range of motion in extremities. No lower extremity tenderness nor  edema. Neurologic:  Normal speech and language. No gross focal neurologic deficits are appreciated.  Skin:  Skin is warm, dry and intact. No rash noted. Psychiatric: Mood and affect are normal. Speech and behavior are normal.  ____________________________________________  ED COURSE:  As part of my medical decision making, I reviewed the following data within the [place] History obtained from family if available, nursing notes, old chart and ekg, as well as notes from prior ED visits. Patient presented for right lower quadrant pain, we will assess with labs and imaging as indicated at this time.   Procedures  Phillip [name] was evaluated in Emergency Department on 09/08/2018 for the symptoms described in the history of present illness. He was evaluated in the context of the global COVID-19 pandemic, which necessitated consideration that the patient might be at risk for infection with the SARS-CoV-2 virus that causes COVID-19. Institutional protocols and algorithms that pertain to the evaluation of patients at risk for COVID-19 are in a state of rapid change based on information released by regulatory bodies including the CDC and federal and state organizations. These policies and algorithms were followed during the patient's care in the ED.  ____________________________________________   LABS (pertinent positives/negatives)  Labs Reviewed  COMPREHENSIVE METABOLIC PANEL - Abnormal; Notable for the following components:      Result Value   Glucose, Bld 153 (*)    BUN 37 (*)    Creatinine, Ser 1.89 (*)    Calcium 8.6 (*)    GFR calc non Af Amer 37 (*)    GFR calc Af Amer 43 (*)    All other components within normal limits  CBC - Abnormal; Notable for the following components:   WBC 13.9 (*)    RBC 4.08 (*)    Hemoglobin 11.3 (*)    HCT 34.4 (*)    All other components within normal limits  LIPASE, BLOOD  URINALYSIS, COMPLETE (UACMP) WITH MICROSCOPIC     RADIOLOGY Images were viewed by me  CT the abdomen pelvis with contrast Reveals acute appendicitis ____________________________________________   DIFFERENTIAL DIAGNOSIS   Appendicitis, perforation, diverticulitis, renal colic, pyelonephritis  FINAL ASSESSMENT AND PLAN  Acute appendicitis   Plan: The patient had presented for right lower quadrant pain. Patient's labs did reveal. Patient's imaging did reveal acute appendicitis.  I will order IV Zosyn for him, he is also received IV fluids for his kidney disease.  I discussed with general surgery for admission.   Laurence Aly, MD    Note: This note was generated in part or whole with voice recognition software. Voice recognition is usually quite accurate but there are transcription errors that can and very often do occur. I apologize for any typographical errors that were not detected and corrected.     Earleen Newport, MD 09/08/18 1425

## 2018-09-08 NOTE — Anesthesia Preprocedure Evaluation (Signed)
Anesthesia Evaluation  Patient identified by MRN, date of birth, ID band Patient awake    Reviewed: Allergy & Precautions, H&P , NPO status , Patient's Chart, lab work & pertinent test results  History of Anesthesia Complications Negative for: history of anesthetic complications  Airway Mallampati: II  TM Distance: >3 FB Neck ROM: full    Dental  (+) Chipped, Poor Dentition, Missing   Pulmonary neg shortness of breath, asthma , former smoker,           Cardiovascular Exercise Tolerance: Good hypertension, (-) angina+ CAD, + CABG, + Peripheral Vascular Disease and +CHF  (-) Past MI and (-) DOE      Neuro/Psych negative neurological ROS  negative psych ROS   GI/Hepatic Neg liver ROS, GERD  Medicated and Controlled,  Endo/Other  diabetes, Type 2  Renal/GU Renal disease     Musculoskeletal   Abdominal   Peds  Hematology negative hematology ROS (+)   Anesthesia Other Findings Past Medical History: No date: Asthma No date: CHF (congestive heart failure) (H[facility]) No date: Coronary artery disease No date: Diabetes mellitus without complication ([place]) No date: Foot ulcer ([place])     Comment:  left foot No date: GERD (gastroesophageal reflux disease) No date: Heart murmur No date: Hypertension No date: Kidney insufficiency No date: Peripheral vascular disease [facility])  Past Surgical History: 03/13/2018: AMPUTATION TOE; Left     Comment:  Procedure: AMPUTATION TOE-MPJ;  Surgeon: Samara Deist,              DPM;  Location: [facility] ORS;  Service: Podiatry;                Laterality: Left; No date: ANGIOPLASTY No date: CARDIAC CATHETERIZATION No date: CARDIAC SURGERY     Comment:  heart bypass (4) No date: CORONARY ARTERY BYPASS GRAFT 03/02/2018: LOWER EXTREMITY ANGIOGRAPHY; Left     Comment:  Procedure: LOWER EXTREMITY ANGIOGRAPHY;  Surgeon: Algernon Huxley, MD;  Location: [place] CV LAB;  Service:        Cardiovascular;  Laterality: Left; No date: ROTATOR CUFF REPAIR; Left  BMI    Body Mass Index:  32.06 kg/m      Reproductive/Obstetrics negative OB ROS                             Anesthesia Physical Anesthesia Plan  ASA: IV  Anesthesia Plan: General ETT   Post-op Pain Management:    Induction: Intravenous  PONV Risk Score and Plan: Ondansetron, Dexamethasone, Midazolam and Treatment may vary due to age or medical condition  Airway Management Planned: Oral ETT  Additional Equipment:   Intra-op Plan:   Post-operative Plan: Extubation in OR  Informed Consent: I have reviewed the patients History and Physical, chart, labs and discussed the procedure including the risks, benefits and alternatives for the proposed anesthesia with the patient or authorized representative who has indicated his/her understanding and acceptance.     Dental Advisory Given  Plan Discussed with: Anesthesiologist, CRNA and Surgeon  Anesthesia Plan Comments: (Patient consented for risks of anesthesia including but not limited to:  - adverse reactions to medications - damage to teeth, lips or other oral mucosa - sore throat or hoarseness - Damage to heart, brain, lungs or loss of life  Patient voiced understanding.)        Anesthesia Quick Evaluation

## 2018-09-08 NOTE — Transfer of Care (Signed)
Immediate Anesthesia Transfer of Care Note  Patient: Phillip [name]  Procedure(s) Performed: APPENDECTOMY LAPAROSCOPIC (N/A )  Patient Location: PACU  Anesthesia Type:General  Level of Consciousness: drowsy  Airway & Oxygen Therapy: Patient connected to face mask oxygen  Post-op Assessment: Post -op Vital signs reviewed and stable  Post vital signs: stable  Last Vitals:  Vitals Value Taken Time  BP 184/88 09/08/2018  5:27 PM  Temp 36.2 C 09/08/2018  5:27 PM  Pulse 81 09/08/2018  5:31 PM  Resp 22 09/08/2018  5:31 PM  SpO2 99 % 09/08/2018  5:31 PM  Vitals shown include unvalidated device data.  Last Pain:  Vitals:   09/08/18 1238  TempSrc:   PainSc: 7          Complications: No apparent anesthesia complications

## 2018-09-08 NOTE — H&P (Signed)
Patient ID: Phillip [name], male   DOB: 23-Oct-1955, 63 y.o.   MRN: [id number]  HPI Phillip [name] is a 63 y.o. male to the ER with a 2-day history of abdominal pain.  He reports that the pain today is severe is located in the right lower quadrant and is constant.  The pain worsens with movements.  He did have nausea as well as decreased appetite.  No respiratory symptoms.  He also reports some chills and had a temperature of 102.5 no emesis no diarrhea no anosmia.  White count is 13.9.  He does have some chronic sufficiency.  CT scan personally reviewed there is evidence of dilated appendix with appendiceal inflammation.  There is also evidence of ileitis.  No evidence of perforation free air or abscess. Does have a history of coronary artery disease and had a CABG x4 more than 15 years ago but his functional status is very reasonable he is able to perform more than 4 METS of activity without any shortness of breath or chest pain.  He is on dual antiplatelet therapy for a stent on his lower extremity secondary to peripheral vascular disease. No previous abd operations.  HPI  Past Medical History:  Diagnosis Date  . Asthma   . CHF (congestive heart failure) ([place])   . Coronary artery disease   . Diabetes mellitus without complication ([place])   . Foot ulcer ([place])    left foot  . GERD (gastroesophageal reflux disease)   . Heart murmur   . Hypertension   . Kidney insufficiency   . Peripheral vascular disease [facility])     Past Surgical History:  Procedure Laterality Date  . AMPUTATION TOE Left 03/13/2018   Procedure: AMPUTATION TOE-MPJ;  Surgeon: Samara Deist, DPM;  Location: [facility] ORS;  Service: Podiatry;  Laterality: Left;  . ANGIOPLASTY    . CARDIAC CATHETERIZATION    . CARDIAC SURGERY     heart bypass (4)  . CORONARY ARTERY BYPASS GRAFT    . LOWER EXTREMITY ANGIOGRAPHY Left 03/02/2018   Procedure: LOWER EXTREMITY ANGIOGRAPHY;  Surgeon: Algernon Huxley, MD;  Location: [place] CV LAB;   Service: Cardiovascular;  Laterality: Left;  . ROTATOR CUFF REPAIR Left     Family History  Problem Relation Age of Onset  . Hyperlipidemia Mother   . Hypertension Mother   . Diabetes Mother   . Heart attack Brother   . Heart disease Brother     Social History Social History   Tobacco Use  . Smoking status: Former Smoker    Last attempt to quit: 2003    Years since quitting: 17.2  . Smokeless tobacco: Never Used  Substance Use Topics  . Alcohol use: Not Currently    Comment: 2003  . Drug use: Not Currently    Types: Marijuana    No Known Allergies  No current facility-administered medications for this encounter.    Current Outpatient Medications  Medication Sig Dispense Refill  . acetaminophen (TYLENOL) 500 MG tablet Take 1,000 mg by mouth every 6 (six) hours as needed for moderate pain or headache.     Marland Kitchen amLODipine (NORVASC) 5 MG tablet Take 5 mg by mouth daily.    Marland Kitchen aspirin 81 MG tablet Take 81 mg by mouth daily.    . carvedilol (COREG) 6.25 MG tablet Take 6.25 mg by mouth 2 (two) times daily with a meal.     . ciprofloxacin (CIPRO) 500 MG tablet Take 500 mg by mouth 2 (two) times daily.  1  . cloNIDine (CATAPRES) 0.3 MG tablet Take 0.3 mg by mouth 2 (two) times daily.     . clopidogrel (PLAVIX) 75 MG tablet Take 1 tablet (75 mg total) by mouth daily. 30 tablet 11  . doxycycline (VIBRA-TABS) 100 MG tablet Take 1 tablet (100 mg total) by mouth 2 (two) times daily. 20 tablet 1  . doxycycline (VIBRAMYCIN) 100 MG capsule Take 100 mg by mouth 2 (two) times daily.    . furosemide (LASIX) 40 MG tablet Take 40 mg by mouth 2 (two) times daily.     Marland Kitchen gabapentin (NEURONTIN) 600 MG tablet Take 600 mg by mouth 2 (two) times daily.     Marland Kitchen glipiZIDE (GLUCOTROL XL) 10 MG 24 hr tablet Take 10 mg by mouth daily with breakfast.    . insulin detemir (LEVEMIR) 100 UNIT/ML injection Inject 80 Units into the skin at bedtime.    . isosorbide mononitrate (IMDUR) 120 MG 24 hr tablet Take 120  mg by mouth daily.    Marland Kitchen lisinopril (PRINIVIL,ZESTRIL) 40 MG tablet Take 40 mg by mouth daily.     Marland Kitchen lovastatin (MEVACOR) 40 MG tablet Take 40 mg by mouth at bedtime.    . metolazone (ZAROXOLYN) 2.5 MG tablet TAKE 1 TABLET BY MOUTH ON MONDAY WEDNESDAY AND FRIDAY  2  . nitroGLYCERIN (NITROSTAT) 0.4 MG SL tablet Place 0.4 mg under the tongue every 5 (five) minutes as needed for chest pain.     Marland Kitchen oxyCODONE-acetaminophen (PERCOCET/ROXICET) 5-325 MG tablet Take by mouth.    . potassium chloride (KLOR-CON) 20 MEQ packet Take 20 mEq by mouth 2 (two) times daily.    Marland Kitchen spironolactone (ALDACTONE) 25 MG tablet Take 25 mg by mouth daily.  3  . vitamin B-12 (CYANOCOBALAMIN) 500 MCG tablet Take 500 mcg by mouth daily.       Review of Systems Full ROS  was asked and was negative except for the information on the HPI  Physical Exam Blood pressure (!) 172/85, pulse 87, temperature (!) 102.5 F (39.2 C), temperature source Oral, resp. rate (!) 25, height 6\' 1"  (1.854 m), weight 110.2 kg, SpO2 96 %. CONSTITUTIONAL: Laying in bed febrile, uncomfortable EYES: Pupils are equal, round, and reactive to light, Sclera are non-icteric. EARS, NOSE, MOUTH AND THROAT: The oropharynx is clear. The oral mucosa is pink and moist. Hearing is intact to voice. LYMPH NODES:  Lymph nodes in the neck are normal. RESPIRATORY:  Lungs are clear. There is normal respiratory effort, equal breath sounds bilaterally, and without pathologic use of accessory muscles. CARDIOVASCULAR: Heart is regular without murmurs, gallops, or rubs. GI: The abdomen is  Tender to palpation RLQ w rebound and peritonitis. GU: Rectal deferred.   MUSCULOSKELETAL: Normal muscle strength and tone. No cyanosis or edema.   SKIN: Turgor is good and there are no pathologic skin lesions or ulcers. NEUROLOGIC: Motor and sensation is grossly normal. Cranial nerves are grossly intact. PSYCH:  Oriented to person, place and time. Affect is normal.  Data  Reviewed  I have personally reviewed the patient's imaging, laboratory findings and medical records.    Assessment//Plan  63 yo presented with clinical signs and symptoms consistent with acute appendicitis confirmed by CT findings.  He has multiple comorbidities including CHF, PAD chronic renal insufficiency and is on dual antiplatelet therapy.  However his abdominal exam is concerning for peritonitis and his physiology is concerning for sepsis with a temperature and  tachycardia.  Given the signs of concerning sepsis I do think  that the best option is to be aggressive from a surgical standpoint.  I did discuss with him different options including antibiotic therapy.  I also discussed with him the Specifid risks given the epidemic of COVID-19 The risks, benefits, complications, treatment options, and expected outcomes were discussed with the patient. The treatment of antibiotics alone was discussed giving a 20% chance that this could fail and surgery would be necessary.  Also discussed continuing to the operating room for Laparoscopic Appendectomy.  The possibilities of  bleeding, recurrent infection, perforation of viscus, finding a normal appendix, the need for additional procedures, failure to diagnose a condition, conversion to open procedure and creating a complication requiring transfusion or further operations were discussed. The patient was given the opportunity to ask questions and have them answered.  Patient would like to proceed with Laparoscopic Appendectomy and consent was obtained.  He also understands the chance of conversion to open and even possibility of right colectomy.   Caroleen Hamman, MD FACS General Surgeon 09/08/2018, 3:24 PM

## 2018-09-08 NOTE — ED Notes (Signed)
Patient states his last PO intake was this morning at 8 am.

## 2018-09-08 NOTE — ED Triage Notes (Signed)
Pt here with c/o RLQ pain that began Sunday night. Denies N,V,D. Worsens with some movement. Fever 102.5, states this is the first day for that. Does have appendix. Denies cough, cp, shob. States he is "very sleepy." NAD.

## 2018-09-08 NOTE — Anesthesia Procedure Notes (Signed)
Procedure Name: Intubation Date/Time: 09/08/2018 4:08 PM Performed by: Aline Brochure, CRNA Pre-anesthesia Checklist: Patient identified, Emergency Drugs available, Suction available and Patient being monitored Patient Re-evaluated:Patient Re-evaluated prior to induction Oxygen Delivery Method: Circle system utilized Preoxygenation: Pre-oxygenation with 100% oxygen Induction Type: IV induction Laryngoscope Size: Mac and 4 Grade View: Grade I Tube type: Oral Tube size: 7.5 mm Number of attempts: 1 Airway Equipment and Method: Stylet Placement Confirmation: ETT inserted through vocal cords under direct vision,  positive ETCO2 and breath sounds checked- equal and bilateral Secured at: 22 cm Tube secured with: Tape Dental Injury: Teeth and Oropharynx as per pre-operative assessment

## 2018-09-08 NOTE — Anesthesia Postprocedure Evaluation (Signed)
Anesthesia Post Note  Patient: Phillip [name]  Procedure(s) Performed: APPENDECTOMY LAPAROSCOPIC (N/A )  Patient location during evaluation: PACU Anesthesia Type: General Level of consciousness: awake and alert Pain management: pain level controlled Vital Signs Assessment: post-procedure vital signs reviewed and stable Respiratory status: spontaneous breathing, nonlabored ventilation, respiratory function stable and patient connected to nasal cannula oxygen Cardiovascular status: blood pressure returned to baseline and stable Postop Assessment: no apparent nausea or vomiting Anesthetic complications: no     Last Vitals:  Vitals:   09/08/18 1844 09/08/18 1848  BP: (!) 224/164 (!) 221/92  Pulse: 96 99  Resp: 17   Temp: 37.2 C   SpO2: 93%     Last Pain:  Vitals:   09/08/18 1857  TempSrc:   PainSc: 8                  Precious Haws [place]

## 2018-09-08 NOTE — Anesthesia Post-op Follow-up Note (Signed)
Anesthesia QCDR form completed.        

## 2018-09-08 NOTE — ED Notes (Signed)
Pt changed out into gown. Dentures and eyeglasses placed into cup and hat and placed into belongings bag. Pt has two belonging bags.

## 2018-09-09 ENCOUNTER — Encounter: Payer: Self-pay | Admitting: Surgery

## 2018-09-09 LAB — CBC
HCT: 31.1 % — ABNORMAL LOW (ref 39.0–52.0)
Hemoglobin: 9.8 g/dL — ABNORMAL LOW (ref 13.0–17.0)
MCH: 27.8 pg (ref 26.0–34.0)
MCHC: 31.5 g/dL (ref 30.0–36.0)
MCV: 88.4 fL (ref 80.0–100.0)
Platelets: 201 10*3/uL (ref 150–400)
RBC: 3.52 MIL/uL — ABNORMAL LOW (ref 4.22–5.81)
RDW: 15.4 % (ref 11.5–15.5)
WBC: 24 10*3/uL — ABNORMAL HIGH (ref 4.0–10.5)
nRBC: 0 % (ref 0.0–0.2)

## 2018-09-09 LAB — BASIC METABOLIC PANEL
Anion gap: 11 (ref 5–15)
BUN: 47 mg/dL — ABNORMAL HIGH (ref 8–23)
CO2: 22 mmol/L (ref 22–32)
Calcium: 8.1 mg/dL — ABNORMAL LOW (ref 8.9–10.3)
Chloride: 112 mmol/L — ABNORMAL HIGH (ref 98–111)
Creatinine, Ser: 2.8 mg/dL — ABNORMAL HIGH (ref 0.61–1.24)
GFR calc Af Amer: 27 mL/min — ABNORMAL LOW (ref >60)
GFR calc non Af Amer: 23 mL/min — ABNORMAL LOW (ref >60)
Glucose, Bld: 181 mg/dL — ABNORMAL HIGH (ref 70–99)
Potassium: 4.9 mmol/L (ref 3.5–5.1)
Sodium: 145 mmol/L (ref 135–145)

## 2018-09-09 LAB — GLUCOSE, CAPILLARY
Glucose-Capillary: 168 mg/dL — ABNORMAL HIGH (ref 70–99)
Glucose-Capillary: 164 mg/dL — ABNORMAL HIGH (ref 70–99)
Glucose-Capillary: 139 mg/dL — ABNORMAL HIGH (ref 70–99)
Glucose-Capillary: 139 mg/dL — ABNORMAL HIGH (ref 70–99)

## 2018-09-09 MED ORDER — INSULIN ASPART 100 UNIT/ML ~~LOC~~ SOLN
4.0000 [IU] | Freq: Three times a day (TID) | SUBCUTANEOUS | Status: DC
  Administered 2018-09-09 – 2018-09-21 (×28): 4 [IU] via SUBCUTANEOUS
  Filled 2018-09-09 (×29): qty 1

## 2018-09-09 MED ORDER — SODIUM CHLORIDE 0.9 % IV BOLUS
500.0000 mL | Freq: Once | INTRAVENOUS | Status: AC
  Administered 2018-09-09: 21:00:00 500 mL via INTRAVENOUS

## 2018-09-09 MED ORDER — SODIUM CHLORIDE 0.9 % IV BOLUS
500.0000 mL | Freq: Once | INTRAVENOUS | Status: AC
  Administered 2018-09-10: 500 mL via INTRAVENOUS

## 2018-09-09 MED ORDER — POLYETHYLENE GLYCOL 3350 17 G PO PACK
17.0000 g | PACK | Freq: Every day | ORAL | Status: DC
  Administered 2018-09-09 – 2018-09-20 (×6): 17 g via ORAL
  Filled 2018-09-09 (×9): qty 1

## 2018-09-09 MED ORDER — PIPERACILLIN-TAZOBACTAM 3.375 G IVPB
3.3750 g | Freq: Three times a day (TID) | INTRAVENOUS | Status: DC
  Administered 2018-09-09 – 2018-09-10 (×3): 3.375 g via INTRAVENOUS
  Filled 2018-09-09 (×3): qty 50

## 2018-09-09 MED ORDER — INSULIN ASPART 100 UNIT/ML ~~LOC~~ SOLN
0.0000 [IU] | Freq: Three times a day (TID) | SUBCUTANEOUS | Status: DC
  Administered 2018-09-09: 2 [IU] via SUBCUTANEOUS
  Administered 2018-09-09: 12:00:00 3 [IU] via SUBCUTANEOUS
  Administered 2018-09-10 – 2018-09-11 (×4): 2 [IU] via SUBCUTANEOUS
  Administered 2018-09-11 – 2018-09-14 (×10): 3 [IU] via SUBCUTANEOUS
  Administered 2018-09-15 (×2): 2 [IU] via SUBCUTANEOUS
  Administered 2018-09-15: 17:00:00 3 [IU] via SUBCUTANEOUS
  Administered 2018-09-16 (×2): 2 [IU] via SUBCUTANEOUS
  Administered 2018-09-17: 19:00:00 3 [IU] via SUBCUTANEOUS
  Administered 2018-09-18 – 2018-09-19 (×5): 2 [IU] via SUBCUTANEOUS
  Administered 2018-09-20: 18:00:00 15 [IU] via SUBCUTANEOUS
  Administered 2018-09-21: 13:00:00 2 [IU] via SUBCUTANEOUS
  Filled 2018-09-09 (×27): qty 1

## 2018-09-09 MED ORDER — SODIUM CHLORIDE 0.9 % IV BOLUS
500.0000 mL | Freq: Once | INTRAVENOUS | Status: AC
  Administered 2018-09-09: 07:00:00 500 mL via INTRAVENOUS

## 2018-09-09 MED ORDER — INSULIN ASPART 100 UNIT/ML ~~LOC~~ SOLN: 0.0000 [IU] | Freq: Every day | SUBCUTANEOUS | Status: DC

## 2018-09-09 NOTE — Progress Notes (Signed)
Spoke with patient's wife Neoma Laming [phone]. Per Neoma Laming patient seems very groggy today. Neoma Laming also states patient has not had a bowel movement for at least a week. Stated to patient I would contact the physician.   Fuller Mandril, RN

## 2018-09-09 NOTE — Progress Notes (Signed)
[facility] Hospital Day(s): 0.   Post op day(s): 1 Day Post-Op.   Interval History: Patient seen and examined, some issues with urinary retention overnight. Otherwise, patient reports that he is feeling better with some abdominal soreness in the RLQ which is improved and different than his presenting pain. No reports of fevers, chills, CP, SOB, nausea, or emesis. He is tolerating clear liquid diet. No reports of flatus. Mobilizing.   Review of Systems:  Constitutional: denies fever, chills  Respiratory: denies any shortness of breath  Cardiovascular: denies chest pain or palpitations  Gastrointestinal: + abdominal soreness, denied N/V, or diarrhea/and bowel function as per interval history Integumentary: denies any other rashes or skin discolorations except laparoscopic incisions   Vital signs in last 24 hours: [min-max] current  Temp:  [97.2 F (36.2 C)-102.5 F (39.2 C)] 98 F (36.7 C) (04/08 0520) Pulse Rate:  [76-99] 92 (04/08 0520) Resp:  [13-29] 20 (04/08 0520) BP: (111-224)/(62-164) 127/71 (04/08 0520) SpO2:  [92 %-100 %] 98 % (04/08 0520) Weight:  [110.2 kg] 110.2 kg (04/07 1239)     Height: 6\' 1"  (185.4 cm) Weight: 110.2 kg BMI (Calculated): 32.07   Intake/Output this shift:  No intake/output data recorded.    Physical Exam:  Constitutional: alert, cooperative and no distress  Respiratory: breathing non-labored at rest  Cardiovascular: regular rate and sinus rhythm  Gastrointestinal: soft, non-tender, and non-distended. No rebound/guarding Integumentary: Laparoscopic incisions are CDI without erythema or drainage  Labs:  CBC Latest Ref Rng & Units 09/09/2018 09/08/2018  WBC 4.0 - 10.5 K/uL 24.0(H) 13.9(H)  Hemoglobin 13.0 - 17.0 g/dL 9.8(L) 11.3(L)  Hematocrit 39.0 - 52.0 % 31.1(L) 34.4(L)  Platelets 150 - 400 K/uL 201 157   CMP Latest Ref Rng & Units 09/09/2018 09/08/2018 02/27/2018  Glucose 70 - 99 mg/dL 181(H) 153(H) -  BUN  8 - 23 mg/dL 47(H) 37(H) 72(H)  Creatinine 0.61 - 1.24 mg/dL 2.80(H) 1.89(H) 2.23(H)  Sodium 135 - 145 mmol/L 145 143 -  Potassium 3.5 - 5.1 mmol/L 4.9 4.1 -  Chloride 98 - 111 mmol/L 112(H) 108 -  CO2 22 - 32 mmol/L 22 23 -  Calcium 8.9 - 10.3 mg/dL 8.1(L) 8.6(L) -  Total Protein 6.5 - 8.1 g/dL - 7.4 -  Total Bilirubin 0.3 - 1.2 mg/dL - 1.0 -  Alkaline Phos 38 - 126 U/L - 76 -  AST 15 - 41 U/L - 18 -  ALT 0 - 44 U/L - 12 -     Imaging studies: No new pertinent imaging studies   Assessment/Plan: (ICD-10's: K35.80) 63 y.o. male with worsening leukocytosis (likely reactive from surgery) and slight elevation in renal function 1 Day Post-Op s/p laparoscopic appendectomy for appendicitis.   - Continue clear liquids for now  - IV Abx (Zosyn); monitor leukocytosis  - Continue IVF (75 ml/hr); monitor renal function  - Pain control prn; antiemetics prn  - Monitor abdominal examination; on-going bowel function  - Medical management of comorbidities  - Encourage mobilization  - Holding DVT prophylaxis  All of the above findings and recommendations were discussed with the patient, and the medical team, and all of patient's questions were answered to his expressed satisfaction.  -- Edison Simon, PA-C  [facility] 09/09/2018, 7:46 AM [phone] M-F: 7am - 4pm

## 2018-09-09 NOTE — Progress Notes (Signed)
Patient had not urinated during day. Bladder scanned and showed >300 mL. Per Dr. Hampton Abbot administer 500 fluid bolus and repeat bladder scan in 2 hours.   Fuller Mandril, RN

## 2018-09-09 NOTE — Care Management Obs Status (Signed)
[facility] NOTIFICATION   Patient Details  Name: Phillip [name] MRN: [id number] Date of Birth: 1955-12-19   Medicare Observation Status Notification Given:  Yes    Beverly Sessions, RN 09/09/2018, 3:35 PM

## 2018-09-09 NOTE — Progress Notes (Signed)
Patient had not urinated any during the night. I bladder scanned him and it showed >585 ml. I called Dr Dahlia Byes and he said to do an in and out cath. Output was 650 ml. Patient tolerated procedure well.

## 2018-09-10 ENCOUNTER — Observation Stay: Payer: PPO

## 2018-09-10 DIAGNOSIS — I739 Peripheral vascular disease, unspecified: Secondary | ICD-10-CM | POA: Diagnosis present

## 2018-09-10 DIAGNOSIS — K219 Gastro-esophageal reflux disease without esophagitis: Secondary | ICD-10-CM | POA: Diagnosis present

## 2018-09-10 DIAGNOSIS — K3580 Unspecified acute appendicitis: Secondary | ICD-10-CM | POA: Diagnosis present

## 2018-09-10 DIAGNOSIS — N170 Acute kidney failure with tubular necrosis: Secondary | ICD-10-CM | POA: Diagnosis present

## 2018-09-10 DIAGNOSIS — K529 Noninfective gastroenteritis and colitis, unspecified: Secondary | ICD-10-CM | POA: Diagnosis present

## 2018-09-10 DIAGNOSIS — E1151 Type 2 diabetes mellitus with diabetic peripheral angiopathy without gangrene: Secondary | ICD-10-CM | POA: Diagnosis present

## 2018-09-10 DIAGNOSIS — Z8349 Family history of other endocrine, nutritional and metabolic diseases: Secondary | ICD-10-CM | POA: Diagnosis not present

## 2018-09-10 DIAGNOSIS — N141 Nephropathy induced by other drugs, medicaments and biological substances: Secondary | ICD-10-CM | POA: Diagnosis not present

## 2018-09-10 DIAGNOSIS — R339 Retention of urine, unspecified: Secondary | ICD-10-CM | POA: Diagnosis not present

## 2018-09-10 DIAGNOSIS — R1031 Right lower quadrant pain: Secondary | ICD-10-CM | POA: Diagnosis present

## 2018-09-10 DIAGNOSIS — R140 Abdominal distension (gaseous): Secondary | ICD-10-CM | POA: Diagnosis not present

## 2018-09-10 DIAGNOSIS — N183 Chronic kidney disease, stage 3 (moderate): Secondary | ICD-10-CM | POA: Diagnosis present

## 2018-09-10 DIAGNOSIS — E1140 Type 2 diabetes mellitus with diabetic neuropathy, unspecified: Secondary | ICD-10-CM | POA: Diagnosis present

## 2018-09-10 DIAGNOSIS — T508X5A Adverse effect of diagnostic agents, initial encounter: Secondary | ICD-10-CM | POA: Diagnosis not present

## 2018-09-10 DIAGNOSIS — J45909 Unspecified asthma, uncomplicated: Secondary | ICD-10-CM | POA: Diagnosis present

## 2018-09-10 DIAGNOSIS — E1122 Type 2 diabetes mellitus with diabetic chronic kidney disease: Secondary | ICD-10-CM | POA: Diagnosis present

## 2018-09-10 DIAGNOSIS — D638 Anemia in other chronic diseases classified elsewhere: Secondary | ICD-10-CM | POA: Diagnosis present

## 2018-09-10 DIAGNOSIS — E785 Hyperlipidemia, unspecified: Secondary | ICD-10-CM | POA: Diagnosis present

## 2018-09-10 DIAGNOSIS — I2510 Atherosclerotic heart disease of native coronary artery without angina pectoris: Secondary | ICD-10-CM | POA: Diagnosis present

## 2018-09-10 DIAGNOSIS — K567 Ileus, unspecified: Secondary | ICD-10-CM | POA: Diagnosis not present

## 2018-09-10 DIAGNOSIS — R809 Proteinuria, unspecified: Secondary | ICD-10-CM | POA: Diagnosis present

## 2018-09-10 DIAGNOSIS — I429 Cardiomyopathy, unspecified: Secondary | ICD-10-CM | POA: Diagnosis present

## 2018-09-10 DIAGNOSIS — I130 Hypertensive heart and chronic kidney disease with heart failure and stage 1 through stage 4 chronic kidney disease, or unspecified chronic kidney disease: Secondary | ICD-10-CM | POA: Diagnosis present

## 2018-09-10 DIAGNOSIS — G4733 Obstructive sleep apnea (adult) (pediatric): Secondary | ICD-10-CM | POA: Diagnosis present

## 2018-09-10 DIAGNOSIS — I5032 Chronic diastolic (congestive) heart failure: Secondary | ICD-10-CM | POA: Diagnosis present

## 2018-09-10 LAB — URINALYSIS, ROUTINE W REFLEX MICROSCOPIC
Bacteria, UA: NONE SEEN
Bilirubin Urine: NEGATIVE
Glucose, UA: NEGATIVE mg/dL
Ketones, ur: NEGATIVE mg/dL
Nitrite: NEGATIVE
Protein, ur: 30 mg/dL — AB
RBC / HPF: >50 RBC/hpf — ABNORMAL HIGH (ref 0–5)
Specific Gravity, Urine: 1.03 (ref 1.005–1.030)
WBC, UA: >50 WBC/hpf — ABNORMAL HIGH (ref 0–5)
pH: 5 (ref 5.0–8.0)

## 2018-09-10 LAB — BASIC METABOLIC PANEL
Anion gap: 14 (ref 5–15)
BUN: 71 mg/dL — ABNORMAL HIGH (ref 8–23)
CO2: 19 mmol/L — ABNORMAL LOW (ref 22–32)
Calcium: 7.8 mg/dL — ABNORMAL LOW (ref 8.9–10.3)
Chloride: 110 mmol/L (ref 98–111)
Creatinine, Ser: 5.15 mg/dL — ABNORMAL HIGH (ref 0.61–1.24)
GFR calc Af Amer: 13 mL/min — ABNORMAL LOW (ref >60)
GFR calc non Af Amer: 11 mL/min — ABNORMAL LOW (ref >60)
Glucose, Bld: 134 mg/dL — ABNORMAL HIGH (ref 70–99)
Potassium: 5.2 mmol/L — ABNORMAL HIGH (ref 3.5–5.1)
Sodium: 143 mmol/L (ref 135–145)

## 2018-09-10 LAB — CBC
HCT: 26.6 % — ABNORMAL LOW (ref 39.0–52.0)
Hemoglobin: 8.2 g/dL — ABNORMAL LOW (ref 13.0–17.0)
MCH: 27.7 pg (ref 26.0–34.0)
MCHC: 30.8 g/dL (ref 30.0–36.0)
MCV: 89.9 fL (ref 80.0–100.0)
Platelets: 164 10*3/uL (ref 150–400)
RBC: 2.96 MIL/uL — ABNORMAL LOW (ref 4.22–5.81)
RDW: 15.9 % — ABNORMAL HIGH (ref 11.5–15.5)
WBC: 22.9 10*3/uL — ABNORMAL HIGH (ref 4.0–10.5)
nRBC: 0 % (ref 0.0–0.2)

## 2018-09-10 LAB — GLUCOSE, CAPILLARY
Glucose-Capillary: 132 mg/dL — ABNORMAL HIGH (ref 70–99)
Glucose-Capillary: 130 mg/dL — ABNORMAL HIGH (ref 70–99)
Glucose-Capillary: 136 mg/dL — ABNORMAL HIGH (ref 70–99)
Glucose-Capillary: 136 mg/dL — ABNORMAL HIGH (ref 70–99)
Glucose-Capillary: 133 mg/dL — ABNORMAL HIGH (ref 70–99)

## 2018-09-10 LAB — SURGICAL PATHOLOGY

## 2018-09-10 LAB — HIV ANTIBODY (ROUTINE TESTING W REFLEX): HIV Screen 4th Generation wRfx: NONREACTIVE

## 2018-09-10 IMAGING — CR ABDOMEN - 1 VIEW
1 series · 3 of 3 positions shown · non-contrast
Comparison: [DATE] abdominal CT

CLINICAL DATA: Abdominal distention

EXAM:
ABDOMEN - 1 VIEW

[Series 1: dg abd 1 view · 0.14mm/px · 3 of 3 slices shown]
[im 1/3]
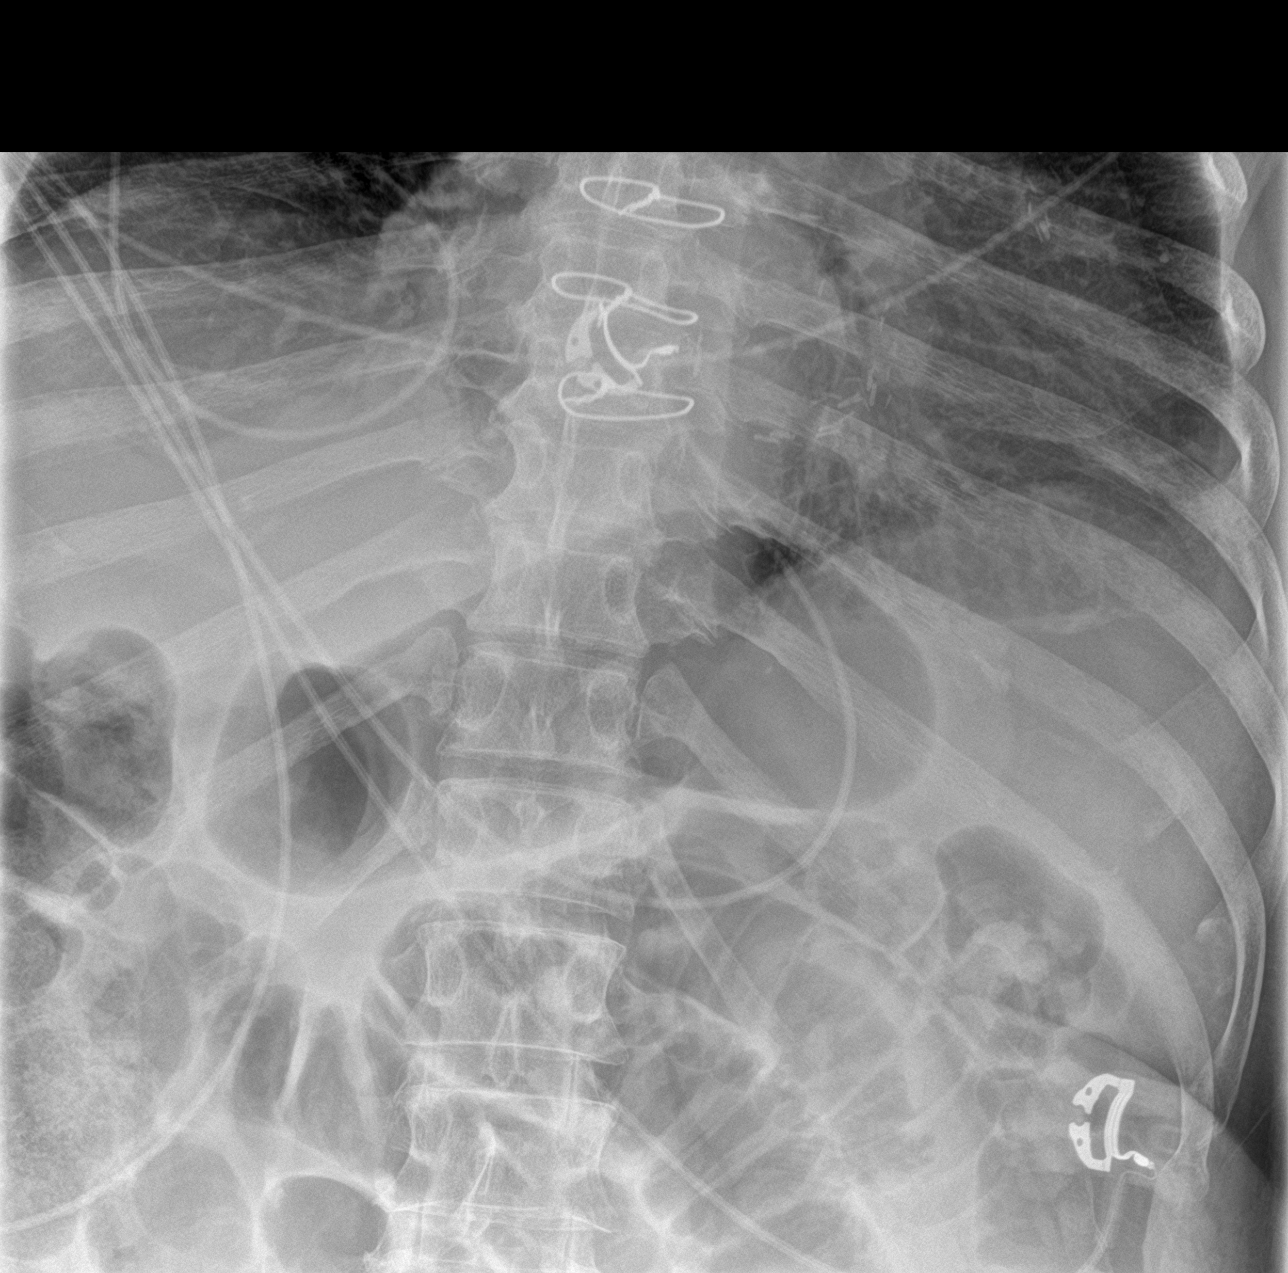
[im 2/3]
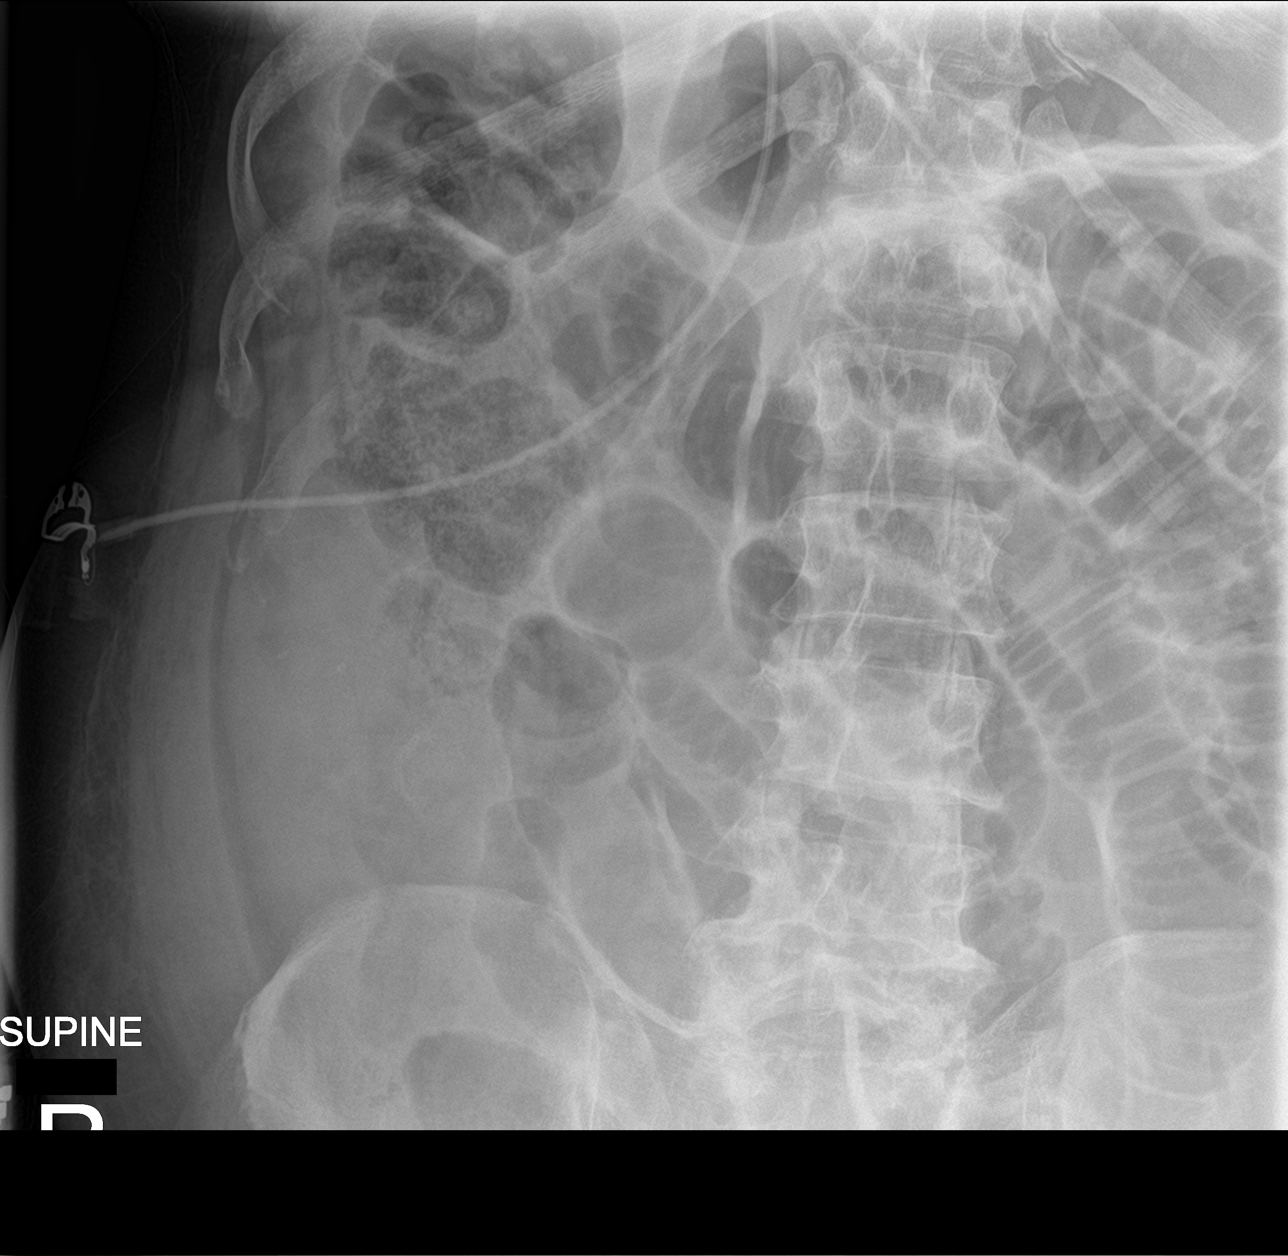
[im 3/3]
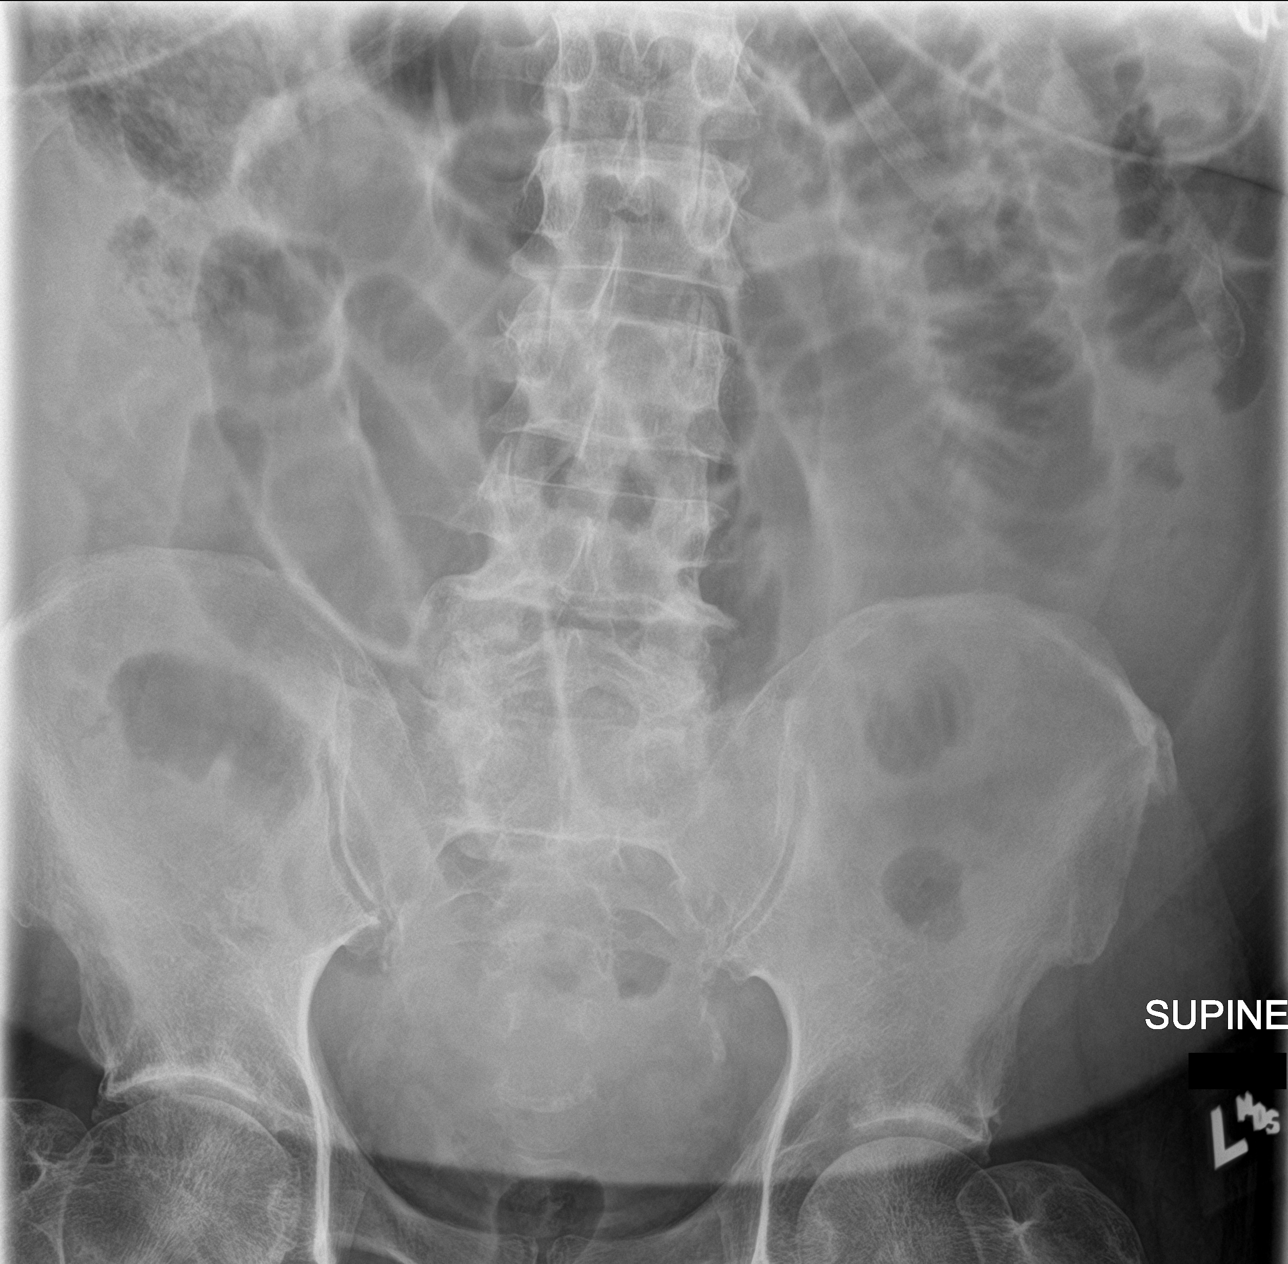

[3 of 3 positions shown; findings below may reference images not displayed]

FINDINGS: Gas dilated small bowel with less colonic gas. No concerning mass
effect or gas collection. Lung bases are clear.
IMPRESSION: Dilated small bowel. Recent surgery suggests ileus, but there is
less colonic gas and a degree of mechanical obstruction is also
possible.

## 2018-09-10 MED ORDER — CALCIUM GLUCONATE-NACL 1-0.675 GM/50ML-% IV SOLN
1.0000 g | Freq: Once | INTRAVENOUS | Status: AC
  Administered 2018-09-10: 1000 mg via INTRAVENOUS
  Filled 2018-09-10: qty 50

## 2018-09-10 MED ORDER — SODIUM CHLORIDE 0.9 % IV BOLUS
500.0000 mL | Freq: Once | INTRAVENOUS | Status: AC
  Administered 2018-09-10: 05:00:00 500 mL via INTRAVENOUS

## 2018-09-10 MED ORDER — SODIUM BICARBONATE 8.4 % IV SOLN
INTRAVENOUS | Status: DC
  Administered 2018-09-10 – 2018-09-15 (×10): via INTRAVENOUS
  Filled 2018-09-10 (×16): qty 150

## 2018-09-10 MED ORDER — PIPERACILLIN-TAZOBACTAM 3.375 G IVPB
3.3750 g | Freq: Two times a day (BID) | INTRAVENOUS | Status: DC
  Administered 2018-09-10 – 2018-09-17 (×15): 3.375 g via INTRAVENOUS
  Filled 2018-09-10 (×15): qty 50

## 2018-09-10 NOTE — Consult Note (Signed)
[facility]  CONSULT NOTE    Date: 09/10/2018                  Patient Name:  Phillip [name]  MRN: [id number]  DOB: 09-26-1955  Age / Sex: 63 y.o., male         PCP: Phillip Bender, PA-C                 Service Requesting Consult: Dr. Celine [name]                 Reason for Consult: Acute Renal Failure            History of Present Illness: [name] is a 63 y.o. black male with diabetes mellitus type II, diabetic neuropathy, hypertension, coronary artery disease status post CABG, peripheral vascular disease, status post left toe amputation, congestive heart failure, asthma, who was admitted to [facility] on 09/08/2018 for Acute appendicitis, unspecified acute appendicitis type [K35.80]  Patient underwent appendectomy by Dr. Dahlia Byes on 4/7. Patient also had CT scan with IV contrast on 4/7.   Nephrology consulted for acute renal failure on chronic kidney disease stage III with proteinuria secondary to diabetic nephropathy.    Medications: Outpatient medications: Medications Prior to Admission  Medication Sig Dispense Refill Last Dose  . amLODipine (NORVASC) 5 MG tablet Take 5 mg by mouth daily.   09/08/2018 at 0800  . aspirin 81 MG tablet Take 81 mg by mouth daily.   09/08/2018 at 0800  . carvedilol (COREG) 6.25 MG tablet Take 6.25 mg by mouth 2 (two) times daily with a meal.    09/08/2018 at 0800  . cloNIDine (CATAPRES) 0.3 MG tablet Take 0.3 mg by mouth 2 (two) times daily.    09/08/2018 at 0800  . clopidogrel (PLAVIX) 75 MG tablet Take 1 tablet (75 mg total) by mouth daily. 30 tablet 11 09/08/2018 at 0800  . doxycycline (VIBRAMYCIN) 100 MG capsule Take 100 mg by mouth 2 (two) times daily.   09/08/2018 at 0800  . furosemide (LASIX) 40 MG tablet Take 40 mg by mouth 2 (two) times daily.    09/08/2018 at 0800  . gabapentin (NEURONTIN) 600 MG tablet Take 600 mg by mouth 2 (two) times daily.    09/08/2018 at 0800  . glipiZIDE (GLUCOTROL XL) 10 MG 24 hr tablet Take 10 mg by mouth  daily with breakfast.   09/08/2018 at 0800  . insulin detemir (LEVEMIR) 100 UNIT/ML injection Inject 40-80 Units into the skin at bedtime. Inject 40 units subcutaneously every morning and 80 units every evening   09/08/2018 at 0800  . isosorbide mononitrate (IMDUR) 120 MG 24 hr tablet Take 120 mg by mouth daily.   09/08/2018 at 0800  . lisinopril (PRINIVIL,ZESTRIL) 40 MG tablet Take 40 mg by mouth daily.    09/08/2018 at 0800  . potassium chloride (KLOR-CON) 20 MEQ packet Take 20 mEq by mouth 2 (two) times daily.   09/08/2018 at 0800  . spironolactone (ALDACTONE) 25 MG tablet Take 25 mg by mouth daily.  3 09/08/2018 at 0800  . vitamin B-12 (CYANOCOBALAMIN) 500 MCG tablet Take 500 mcg by mouth daily.   09/08/2018 at 0800  . Vitamin D, Ergocalciferol, (DRISDOL) 1.25 MG (50000 UT) CAPS capsule Take 50,000 Units by mouth once a week.   Past Week at Unknown time  . acetaminophen (TYLENOL) 500 MG tablet Take 1,000 mg by mouth every 6 (six) hours as needed for moderate pain or headache.  prn at prn  . ciprofloxacin (CIPRO) 500 MG tablet Take 500 mg by mouth 2 (two) times daily.  1 Completed Course at Unknown time  . doxycycline (VIBRA-TABS) 100 MG tablet Take 1 tablet (100 mg total) by mouth 2 (two) times daily. (Patient not taking: Reported on 09/09/2018) 20 tablet 1 Completed Course at Unknown time  . lovastatin (MEVACOR) 40 MG tablet Take 40 mg by mouth at bedtime.   09/07/2018 at 2000  . metolazone (ZAROXOLYN) 2.5 MG tablet TAKE 1 TABLET BY MOUTH ON MONDAY WEDNESDAY AND FRIDAY  2 09/07/2018 at 0800  . nitroGLYCERIN (NITROSTAT) 0.4 MG SL tablet Place 0.4 mg under the tongue every 5 (five) minutes as needed for chest pain.    prn at prn  . oxyCODONE-acetaminophen (PERCOCET/ROXICET) 5-325 MG tablet Take 1 tablet by mouth every 4 (four) hours as needed.    prn at prn    Current medications: Current Facility-Administered Medications  Medication Dose Route Frequency Provider Last Rate Last Dose  . 0.9 %  sodium chloride  infusion   Intravenous PRN Caroleen Hamman F, MD 10 mL/hr at 09/10/18 0839 250 mL at 09/10/18 0839  . acetaminophen (TYLENOL) tablet 1,000 mg  1,000 mg Oral Q6H Pabon, Diego F, MD   1,000 mg at 09/10/18 0504  . carvedilol (COREG) tablet 6.25 mg  6.25 mg Oral BID WC Pabon, Diego F, MD   6.25 mg at 09/10/18 3016  . gabapentin (NEURONTIN) capsule 600 mg  600 mg Oral BID Pabon, [place] F, MD   600 mg at 09/10/18 0945  . hydrALAZINE (APRESOLINE) injection 10 mg  10 mg Intravenous Q2H PRN Caroleen Hamman F, MD   10 mg at 09/08/18 1857  . HYDROmorphone (DILAUDID) injection 0.5 mg  0.5 mg Intravenous Q3H PRN Pabon, Diego F, MD   0.5 mg at 09/10/18 0207  . insulin aspart (novoLOG) injection 0-15 Units  0-15 Units Subcutaneous TID WC Jules Husbands, MD   2 Units at 09/10/18 [phone]  . insulin aspart (novoLOG) injection 0-5 Units  0-5 Units Subcutaneous QHS Pabon, Diego F, MD      . insulin aspart (novoLOG) injection 4 Units  4 Units Subcutaneous TID WC Jules Husbands, MD   4 Units at 09/09/18 [phone]  . insulin detemir (LEVEMIR) injection 80 Units  80 Units Subcutaneous QHS Jules Husbands, MD   Stopped at 09/09/18 2149  . isosorbide mononitrate (IMDUR) 24 hr tablet 120 mg  120 mg Oral Daily Pabon, [place] F, MD   120 mg at 09/10/18 0945  . nitroGLYCERIN (NITROSTAT) SL tablet 0.4 mg  0.4 mg Sublingual Q5 min PRN Pabon, Diego F, MD      . ondansetron (ZOFRAN-ODT) disintegrating tablet 4 mg  4 mg Oral Q6H PRN Pabon, Diego F, MD       Or  . ondansetron (ZOFRAN) injection 4 mg  4 mg Intravenous Q6H PRN Jules Husbands, MD   4 mg at 09/09/18 0940  . oxyCODONE (Oxy IR/ROXICODONE) immediate release tablet 5-10 mg  5-10 mg Oral Q4H PRN Caroleen Hamman F, MD   10 mg at 09/08/18 1857  . pantoprazole (PROTONIX) injection 40 mg  40 mg Intravenous QHS Caroleen Hamman F, MD   40 mg at 09/09/18 2153  . piperacillin-tazobactam (ZOSYN) IVPB 3.375 g  3.375 g Intravenous Q12H Vina Byrd, MD      . polyethylene glycol (MIRALAX / GLYCOLAX) packet  17 g  17 g Oral Daily Piscoya, Jose, MD   17 g  at 09/09/18 1838  . prochlorperazine (COMPAZINE) tablet 10 mg  10 mg Oral Q6H PRN Pabon, Diego F, MD       Or  . prochlorperazine (COMPAZINE) injection 5-10 mg  5-10 mg Intravenous Q6H PRN Caroleen Hamman F, MD   10 mg at 09/10/18 0215  . sodium bicarbonate 150 mEq in dextrose 5 % 1,000 mL infusion   Intravenous Continuous Lavonia Dana, MD 75 mL/hr at 09/10/18 0949    . traMADol (ULTRAM) tablet 50 mg  50 mg Oral Q6H PRN Pabon, Marjory Lies, MD          Allergies: No Known Allergies    Past Medical History: Past Medical History:  Diagnosis Date  . Asthma   . CHF (congestive heart failure) ([place])   . Coronary artery disease   . Diabetes mellitus without complication ([place])   . Foot ulcer ([place])    left foot  . GERD (gastroesophageal reflux disease)   . Heart murmur   . Hypertension   . Kidney insufficiency   . Peripheral vascular disease [facility])      Past Surgical History: Past Surgical History:  Procedure Laterality Date  . AMPUTATION TOE Left 03/13/2018   Procedure: AMPUTATION TOE-MPJ;  Surgeon: Samara Deist, DPM;  Location: [facility] ORS;  Service: Podiatry;  Laterality: Left;  . ANGIOPLASTY    . CARDIAC CATHETERIZATION    . CARDIAC SURGERY     heart bypass (4)  . CORONARY ARTERY BYPASS GRAFT    . LAPAROSCOPIC APPENDECTOMY N/A 09/08/2018   Procedure: APPENDECTOMY LAPAROSCOPIC;  Surgeon: Jules Husbands, MD;  Location: [facility] ORS;  Service: General;  Laterality: N/A;  . LOWER EXTREMITY ANGIOGRAPHY Left 03/02/2018   Procedure: LOWER EXTREMITY ANGIOGRAPHY;  Surgeon: Algernon Huxley, MD;  Location: [place] CV LAB;  Service: Cardiovascular;  Laterality: Left;  . ROTATOR CUFF REPAIR Left      Family History: Family History  Problem Relation Age of Onset  . Hyperlipidemia Mother   . Hypertension Mother   . Diabetes Mother   . Heart attack Brother   . Heart disease Brother      Social History: Social History   Socioeconomic History   . Marital status: Married    Spouse name: Not on file  . Number of children: Not on file  . Years of education: Not on file  . Highest education level: Not on file  Occupational History  . Not on file  Social Needs  . Financial resource strain: Not on file  . Food insecurity:    Worry: Not on file    Inability: Not on file  . Transportation needs:    Medical: Not on file    Non-medical: Not on file  Tobacco Use  . Smoking status: Former Smoker    Last attempt to quit: 2003    Years since quitting: 17.2  . Smokeless tobacco: Never Used  Substance and Sexual Activity  . Alcohol use: Not Currently    Comment: 2003  . Drug use: Not Currently    Types: Marijuana  . Sexual activity: Not on file  Lifestyle  . Physical activity:    Days per week: Not on file    Minutes per session: Not on file  . Stress: Not on file  Relationships  . Social connections:    Talks on phone: Not on file    Gets together: Not on file    Attends religious service: Not on file    Active member of club or organization:  Not on file    Attends meetings of clubs or organizations: Not on file    Relationship status: Not on file  . Intimate partner violence:    Fear of current or ex partner: Not on file    Emotionally abused: Not on file    Physically abused: Not on file    Forced sexual activity: Not on file  Other Topics Concern  . Not on file  Social History Narrative  . Not on file     Review of Systems: Review of Systems  Constitutional: Negative.  Negative for chills, diaphoresis, fever, malaise/fatigue and weight loss.  HENT: Negative.  Negative for congestion, ear discharge, ear pain, hearing loss, nosebleeds, sinus pain, sore throat and tinnitus.   Eyes: Negative.  Negative for blurred vision, double vision, photophobia, pain, discharge and redness.  Respiratory: Negative.  Negative for cough, hemoptysis, sputum production, shortness of breath, wheezing and stridor.   Cardiovascular:  Negative.  Negative for chest pain, palpitations, orthopnea, claudication, leg swelling and PND.  Gastrointestinal: Positive for abdominal pain. Negative for blood in stool, constipation, diarrhea, heartburn, melena, nausea and vomiting.       +flatus  Genitourinary: Negative.  Negative for dysuria, flank pain, frequency, hematuria and urgency.  Musculoskeletal: Negative.  Negative for back pain, falls, joint pain, myalgias and neck pain.  Skin: Negative.  Negative for itching and rash.  Neurological: Negative.  Negative for dizziness, tingling, tremors, sensory change, speech change, focal weakness, seizures, loss of consciousness, weakness and headaches.  Endo/Heme/Allergies: Negative.  Negative for environmental allergies and polydipsia. Does not bruise/bleed easily.  Psychiatric/Behavioral: Negative.  Negative for depression, hallucinations, memory loss, substance abuse and suicidal ideas. The patient is not nervous/anxious and does not have insomnia.     Vital Signs: Blood pressure 121/71, pulse 94, temperature 99.2 F (37.3 C), temperature source Oral, resp. rate 17, height 6\' 1"  (1.854 m), weight 110.2 kg, SpO2 92 %.  Weight trends: Filed Weights   09/08/18 1239  Weight: 110.2 kg    Physical Exam: General: NAD, sitting in chair  Head: Normocephalic, atraumatic. Moist oral mucosal membranes  Eyes: Anicteric, PERRL  Neck: Supple, trachea midline  Lungs:  Clear to auscultation  Heart: Regular rate and rhythm  Abdomen:  Soft, nontender, laproscopic incisions - clean and intact, obese  Extremities:  no peripheral edema. Left first toe amputation  Neurologic: Nonfocal, moving all four extremities  Skin: No lesions         Lab results: Basic Metabolic Panel: Recent Labs  Lab 09/08/18 1315 09/09/18 0317 09/10/18 0439  NA 143 145 143  K 4.1 4.9 5.2*  CL 108 112* 110  CO2 23 22 19*  GLUCOSE 153* 181* 134*  BUN 37* 47* 71*  CREATININE 1.89* 2.80* 5.15*  CALCIUM 8.6* 8.1*  7.8*    Liver Function Tests: Recent Labs  Lab 09/08/18 1315  AST 18  ALT 12  ALKPHOS 76  BILITOT 1.0  PROT 7.4  ALBUMIN 4.0   Recent Labs  Lab 09/08/18 1315  LIPASE 28   No results for input(s): AMMONIA in the last 168 hours.  CBC: Recent Labs  Lab 09/08/18 1315 09/09/18 0317 09/10/18 0439  WBC 13.9* 24.0* 22.9*  HGB 11.3* 9.8* 8.2*  HCT 34.4* 31.1* 26.6*  MCV 84.3 88.4 89.9  PLT 157 201 164    Cardiac Enzymes: No results for input(s): CKTOTAL, CKMB, CKMBINDEX, TROPONINI in the last 168 hours.  BNP: Invalid input(s): POCBNP  CBG: Recent Labs  Lab 09/09/18  1145 09/09/18 1644 09/09/18 2008 09/10/18 0730 09/10/18 1107  GLUCAP 164* 139* 139* 132* 130*    Microbiology: Results for orders placed or performed during the hospital encounter of 03/13/18  Aerobic Culture (superficial specimen)     Status: None   Collection Time: 03/13/18 10:06 AM  Result Value Ref Range Status   Specimen Description   Final    WOUND Performed at [facility], [address], [place], [place] [address]    Special Requests   Final    NONE Performed at [facility], [address] [place], [place] [address]    Gram Stain   Final    RARE WBC PRESENT,BOTH PMN AND MONONUCLEAR RARE GRAM POSITIVE COCCI Performed at [facility], [place] [address], [place],  [address]    Culture   Final    FEW ENTEROBACTER CLOACAE FEW VIRIDANS STREPTOCOCCUS FEW SERRATIA MARCESCENS    Report Status 03/17/2018 FINAL  Final   Organism ID, Bacteria ENTEROBACTER CLOACAE  Final   Organism ID, Bacteria SERRATIA MARCESCENS  Final      Susceptibility   Enterobacter cloacae - MIC*    CEFAZOLIN >=64 RESISTANT Resistant     CEFEPIME <=1 SENSITIVE Sensitive     CEFTAZIDIME <=1 SENSITIVE Sensitive     CEFTRIAXONE <=1 SENSITIVE Sensitive     CIPROFLOXACIN <=0.25 SENSITIVE Sensitive     GENTAMICIN <=1 SENSITIVE Sensitive     IMIPENEM <=0.25 SENSITIVE Sensitive      TRIMETH/SULFA <=20 SENSITIVE Sensitive     PIP/TAZO 16 SENSITIVE Sensitive     * FEW ENTEROBACTER CLOACAE   Serratia marcescens - MIC*    CEFAZOLIN >=64 RESISTANT Resistant     CEFEPIME <=1 SENSITIVE Sensitive     CEFTAZIDIME <=1 SENSITIVE Sensitive     CEFTRIAXONE <=1 SENSITIVE Sensitive     CIPROFLOXACIN <=0.25 SENSITIVE Sensitive     GENTAMICIN <=1 SENSITIVE Sensitive     TRIMETH/SULFA <=20 SENSITIVE Sensitive     * FEW SERRATIA MARCESCENS    Coagulation Studies: No results for input(s): LABPROT, INR in the last 72 hours.  Urinalysis: Recent Labs    09/10/18 0441  COLORURINE AMBER*  LABSPEC 1.030  PHURINE 5.0  GLUCOSEU NEGATIVE  HGBUR MODERATE*  BILIRUBINUR NEGATIVE  KETONESUR NEGATIVE  PROTEINUR 30*  NITRITE NEGATIVE  LEUKOCYTESUR TRACE*      Imaging: Dg Abd 1 View  Result Date: 09/10/2018 CLINICAL DATA:  Abdominal distention EXAM: ABDOMEN - 1 VIEW COMPARISON:  09/08/2018 abdominal CT FINDINGS: Gas dilated small bowel with less colonic gas. No concerning mass effect or gas collection. Lung bases are clear. IMPRESSION: Dilated small bowel. Recent surgery suggests ileus, but there is less colonic gas and a degree of mechanical obstruction is also possible. Electronically Signed   By: Monte Fantasia M.D.   On: 09/10/2018 07:25   Ct Abdomen Pelvis W Contrast  Result Date: 09/08/2018 CLINICAL DATA:  63 year old male with a history of right lower quadrant pain EXAM: CT ABDOMEN AND PELVIS WITH CONTRAST TECHNIQUE: Multidetector CT imaging of the abdomen and pelvis was performed using the standard protocol following bolus administration of intravenous contrast. CONTRAST:  181mL OMNIPAQUE IOHEXOL 300 MG/ML  SOLN COMPARISON:  None. FINDINGS: Lower chest: No acute finding of the lower chest. 6 mm nodule at the periphery of the left lower lobe (image 11 of series 4). Hepatobiliary: Unremarkable appearance of liver. Cholelithiasis without associated inflammatory changes. Pancreas:  Unremarkable Spleen: Unremarkable Adrenals/Urinary Tract: Unremarkable appearance of the adrenal glands. No evidence  of hydronephrosis of the right or left kidney. No nephrolithiasis. Unremarkable course of the bilateral ureters. Unremarkable appearance of the urinary bladder. Stomach/Bowel: Unremarkable stomach. Proximal small bowel unremarkable with no focal wall thickening or transition point. Majority of distal small bowel is unremarkable. There is circumferential wall enhancement at the terminal ileum just above the IC valve. Minimal inflammatory changes in the adjacent fat with trace free fluid. Inflammatory changes surround the appendix with fecalized material/gas within the lumen. No evidence of perforation. No calcified appendicoliths. Colon is relatively unremarkable with mild stool burden. Colonic diverticula without associated inflammatory changes. Vascular/Lymphatic: Atherosclerotic calcifications of the abdominal aorta and the bilateral iliac arteries. No adenopathy. Reproductive: Unremarkable pelvic structures Other: No abdominal wall hernia. Musculoskeletal: Bilateral L5 pars defect with grade 1 anterolisthesis and associated degenerative disc disease of L5-S1. Vacuum disc phenomenon at this level. No acute displaced fracture. Degenerative changes of the hips. IMPRESSION: Acute appendicitis, uncomplicated. There is a presumed reactive ileitis of the terminal ileum. These results were discussed by telephone at the time of interpretation on 09/08/2018 at 2:21 pm with Dr. Lenise Arena. Cholelithiasis without evidence of acute cholecystitis. Nodule at the left lung base, 6 mm. Non-contrast chest CT at 6-12 months is recommended. If the nodule is stable at time of repeat CT, then future CT at 18-24 months (from today's scan) is considered optional for low-risk patients, but is recommended for high-risk patients. This recommendation follows the consensus statement: Guidelines for Management of  Incidental Pulmonary Nodules Detected on CT Images: From the Fleischner Society 2017; Radiology 2017; 284:228-243. Electronically Signed   By: Corrie Mckusick D.O.   On: 09/08/2018 14:21      Assessment & Plan: [name] is a 63 y.o. black male with diabetes mellitus type II, diabetic neuropathy, hypertension, coronary artery disease status post CABG, peripheral vascular disease, status post left toe amputation, congestive heart failure, asthma, who was admitted to [facility] on 09/08/2018 for Acute appendicitis, unspecified acute appendicitis type [K35.80]  1. Acute renal failure on chronic kidney disease stage III with proteinuria: baseline creatinine of 2.23, GFR of 35 on 08/10/2018.  Chronic Kidney Disease secondary to diabetic nephropathy Acute renal failure secondary to ATN from IV contrast nephropathy and possibly from acute illness, acute appendicitis.  No indication for dialysis.  - Holding lisinopril, furosemide, spironolactone, potassium chloride, and metolazone.  - Continue IV fluids: change to sodium bicarbonate infusion.  - renally dose pip/tazo  2. Hypertension: 121/71 - hydralazine, carvedilol, isosorbide mononitrate  3. Diabetes mellitus type II with chronic kidney disease: insulin dependent. not well controlled. Hemoglobin A1c of 9.1% on 01/14/18   LOS: 0 Ladarian Bonczek 4/9/202011:09 AM

## 2018-09-10 NOTE — Progress Notes (Addendum)
[place] Hospital Day(s): 0.   Post op day(s): 2 Days Post-Op.   Interval History: Patient seen and examined, he had issues with oliguria post-operatively and has since become essentially anuric despite multiple fluid boluses. His sCr has also elevated to 5.15.   Additionally, the patient was complaining of abdominal bloating and decreased flatus over the course of yesterday. KUB was obtained which showed dilated small bowel loops concerning for post-surgical ileus with limited gas in the colon.   This morning, the patient reports he continues to have worsening abdominal distension. No reports of significant pain, no nausea or emesis. Still without flatus/BM.   Review of Systems:  Constitutional: denies fever, chills  Respiratory: denies any shortness of breath  Cardiovascular: denies chest pain or palpitations  Gastrointestinal: + distension, denies abdominal pain, N/V, or diarrhea/and bowel function as per interval history Genitourinary: + Oliguria Integumentary: denies any other rashes or skin discolorations except laparoscopic incisions   Vital signs in last 24 hours: [min-max] current  Temp:  [99.2 F (37.3 C)-100 F (37.8 C)] 99.2 F (37.3 C) (04/09 0412) Pulse Rate:  [90-99] 94 (04/09 0412) Resp:  [17-18] 17 (04/09 0412) BP: (121-137)/(71-84) 121/71 (04/09 0412) SpO2:  [92 %-96 %] 92 % (04/09 0412)     Height: 6\' 1"  (185.4 cm) Weight: 110.2 kg BMI (Calculated): 32.07   Intake/Output this shift:  No intake/output data recorded.     Physical Exam:  Constitutional: alert, cooperative and no distress  Respiratory: breathing non-labored at rest  Cardiovascular: regular rate and sinus rhythm  Gastrointestinal: soft, non-tender, significant distension, tympanic to percussion, no rebound/guarding  Integumentary: Laparoscopic incisions are CDI, no erythema or drainage  Labs:  CBC Latest Ref Rng & Units 09/10/2018 09/09/2018 09/08/2018   WBC 4.0 - 10.5 K/uL 22.9(H) 24.0(H) 13.9(H)  Hemoglobin 13.0 - 17.0 g/dL 8.2(L) 9.8(L) 11.3(L)  Hematocrit 39.0 - 52.0 % 26.6(L) 31.1(L) 34.4(L)  Platelets 150 - 400 K/uL 164 201 157   CMP Latest Ref Rng & Units 09/10/2018 09/09/2018 09/08/2018  Glucose 70 - 99 mg/dL 134(H) 181(H) 153(H)  BUN 8 - 23 mg/dL 71(H) 47(H) 37(H)  Creatinine 0.61 - 1.24 mg/dL 5.15(H) 2.80(H) 1.89(H)  Sodium 135 - 145 mmol/L 143 145 143  Potassium 3.5 - 5.1 mmol/L 5.2(H) 4.9 4.1  Chloride 98 - 111 mmol/L 110 112(H) 108  CO2 22 - 32 mmol/L 19(L) 22 23  Calcium 8.9 - 10.3 mg/dL 7.8(L) 8.1(L) 8.6(L)  Total Protein 6.5 - 8.1 g/dL - - 7.4  Total Bilirubin 0.3 - 1.2 mg/dL - - 1.0  Alkaline Phos 38 - 126 U/L - - 76  AST 15 - 41 U/L - - 18  ALT 0 - 44 U/L - - 12     Imaging studies:   KUB (09/10/2018) personally reviewed and radiologist report reviewed and agree with below:  IMPRESSION: Dilated small bowel. Recent surgery suggests ileus, but there is less colonic gas and a degree of mechanical obstruction is also possible   Assessment/Plan: (ICD-10's: K35.80) 63 y.o. male now with oliguria and significant sCr elevation this morning despite IVF resuscitation limited by his history of CHF, hyperkalemia, anemia (hgb 8.2) which may be dilutional although he was on plavix/ASA preop, and abdominal distension with KUB concerning for post-op ileus for obstruction. His leukocytosis is slightly improved and he is without any fever, tachycardia, or hypotension 2 Days Post-Op s/p laparoscopic appendectomy for appendicitis.   - Will make NPO  - Pain control prn; antiemetics  prn  - Monitor abdominal examination; urine output; ongoing bowel function  - Trend leukocytosis; continue IV Zosyn; morning CBC  - Trend hgb; Type and screen ordered  - Trend renal function; morning BMP; IVF; nephrology consult placed  - Monitor K+; 1g calcium gluconate given   - Medical management of comorbidities             - Encourage mobilization              - Holding DVT prophylaxis  All of the above findings and recommendations were discussed with the patient, and the medical team, and all of patient's questions were answered to his expressed satisfaction.  -- Edison Simon, PA-C  [facility] 09/10/2018, 8:25 AM [phone] M-F: 7am - 4pm  Curbside discussion with nephrology--potentially post-contrast nephropathy d/t CT A/P on presentation to ED.  Will await recs. I saw and evaluated the patient.  I agree with the above documentation, exam, and plan, which I have edited where appropriate. Fredirick Maudlin  10:15 AM

## 2018-09-10 NOTE — Progress Notes (Signed)
Called Dr. Hampton Abbot because patient had not urinated all day.  Patient was given a 500 mL bolus bag of normal saline and bladder scan showed 509 mL of urine.  In and out cathed patient and only got out 25mL.  Johnathan Hausen NT3 inserted foley catheter, got urine return but not enough for urine specimen. Patient given another 500 mL bolus and continuous fluids were increased to 100 mL/hr. Patient is distended and complaining of soreness and nausea.  Passing a little flatus but has not had a bowel movement in a week.  Miralax has been prescribed but no movement yet.  Bowel sounds are hypoactive.  Will continue to monitor patient.  Labs will be drawn in the morning to check kidney function.  Christene Slates  09/10/2018  2:38 AM

## 2018-09-10 NOTE — Progress Notes (Signed)
Pharmacist-Physician Communication  Zosyn 3.375 g IV extended infusion q8h adjusted to q12h for CrCl < 20. Will continue to monitor renal function and adjust as needed.  Tawnya Crook, PharmD Pharmacy Resident  09/10/2018 12:38 PM

## 2018-09-10 NOTE — Progress Notes (Signed)
   09/10/18 1300  Clinical Encounter Type  Visited With Patient  Visit Type Initial;Spiritual support  Referral From Chaplain  Consult/Referral To Chaplain  Spiritual Encounters  Spiritual Needs Emotional  Stress Factors  Patient Stress Factors Exhausted  Chaplain was rounding by phone calls and spoke with patient. Patient seemed to be a little tired so Chaplain introduced who she was and offered what could she do and he stated " I would like to get in back in bed". Chaplain ask did he used his called bell. Patient stated "Yes". Chaplain told patient she would let the unit know. Chaplain called the unit to inform them.

## 2018-09-11 ENCOUNTER — Inpatient Hospital Stay: Payer: PPO

## 2018-09-11 LAB — CBC
HCT: 23.3 % — ABNORMAL LOW (ref 39.0–52.0)
Hemoglobin: 7.3 g/dL — ABNORMAL LOW (ref 13.0–17.0)
MCH: 28 pg (ref 26.0–34.0)
MCHC: 31.3 g/dL (ref 30.0–36.0)
MCV: 89.3 fL (ref 80.0–100.0)
Platelets: 166 10*3/uL (ref 150–400)
RBC: 2.61 MIL/uL — ABNORMAL LOW (ref 4.22–5.81)
RDW: 15.9 % — ABNORMAL HIGH (ref 11.5–15.5)
WBC: 21.4 10*3/uL — ABNORMAL HIGH (ref 4.0–10.5)
nRBC: 0 % (ref 0.0–0.2)

## 2018-09-11 LAB — GLUCOSE, CAPILLARY
Glucose-Capillary: 183 mg/dL — ABNORMAL HIGH (ref 70–99)
Glucose-Capillary: 158 mg/dL — ABNORMAL HIGH (ref 70–99)
Glucose-Capillary: 122 mg/dL — ABNORMAL HIGH (ref 70–99)
Glucose-Capillary: 121 mg/dL — ABNORMAL HIGH (ref 70–99)

## 2018-09-11 LAB — PREPARE RBC (CROSSMATCH)

## 2018-09-11 LAB — BASIC METABOLIC PANEL
Anion gap: 13 (ref 5–15)
BUN: 90 mg/dL — ABNORMAL HIGH (ref 8–23)
CO2: 22 mmol/L (ref 22–32)
Calcium: 7.8 mg/dL — ABNORMAL LOW (ref 8.9–10.3)
Chloride: 106 mmol/L (ref 98–111)
Creatinine, Ser: 7.02 mg/dL — ABNORMAL HIGH (ref 0.61–1.24)
GFR calc Af Amer: 9 mL/min — ABNORMAL LOW (ref >60)
GFR calc non Af Amer: 8 mL/min — ABNORMAL LOW (ref >60)
Glucose, Bld: 167 mg/dL — ABNORMAL HIGH (ref 70–99)
Potassium: 4.9 mmol/L (ref 3.5–5.1)
Sodium: 141 mmol/L (ref 135–145)

## 2018-09-11 LAB — MAGNESIUM: Magnesium: 2.2 mg/dL (ref 1.7–2.4)

## 2018-09-11 LAB — ABO/RH: ABO/RH(D): O POS

## 2018-09-11 LAB — HEMOGLOBIN AND HEMATOCRIT, BLOOD
HCT: 25.3 % — ABNORMAL LOW (ref 39.0–52.0)
Hemoglobin: 8.2 g/dL — ABNORMAL LOW (ref 13.0–17.0)

## 2018-09-11 LAB — PHOSPHORUS: Phosphorus: 7 mg/dL — ABNORMAL HIGH (ref 2.5–4.6)

## 2018-09-11 IMAGING — CT CT ABDOMEN AND PELVIS WITHOUT CONTRAST
2 of 4 series · 16 of 46 positions shown, 18 images · non-contrast
Comparison: [DATE]

CLINICAL DATA: Postop appendectomy.  Abdominal distention.

EXAM:
CT ABDOMEN AND PELVIS WITHOUT CONTRAST
TECHNIQUE: Multidetector CT imaging of the abdomen and pelvis was performed
following the standard protocol without IV contrast.

[Series 2: axial st · axial · 0.82mm/px · z∈[-1106,-611]mm · 13 of 109 slices shown, 15 images]
[im 5/109  soft-tissue]
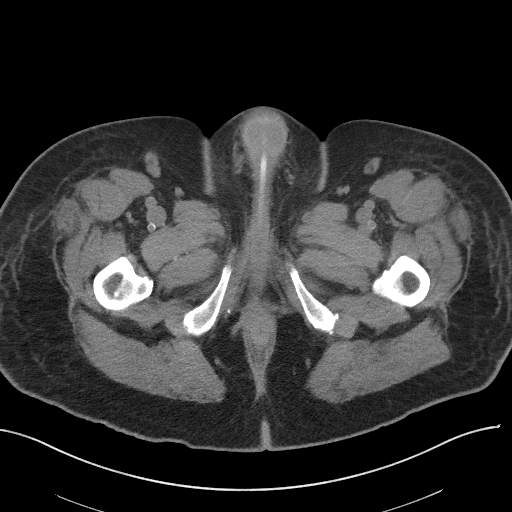
[im 5/109  bone]
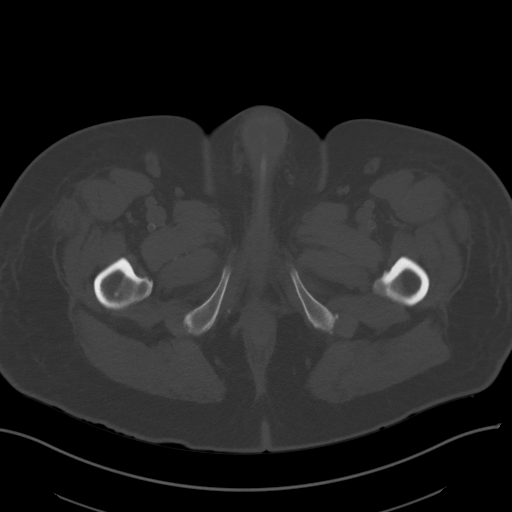
[im 13/109  soft-tissue]
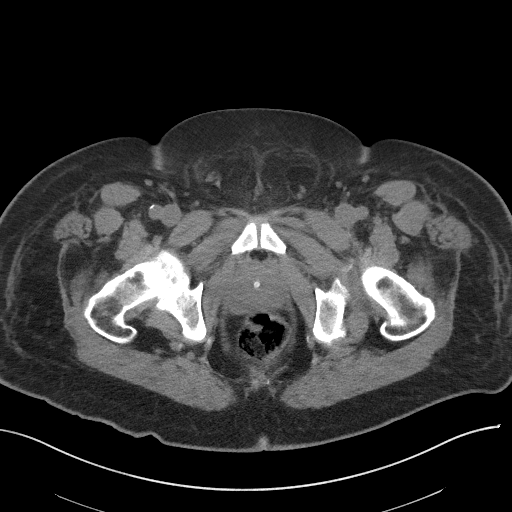
[im 22/109  soft-tissue]
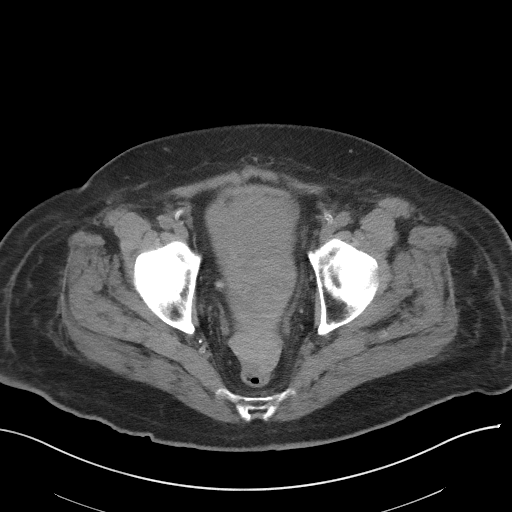
[im 31/109  soft-tissue]
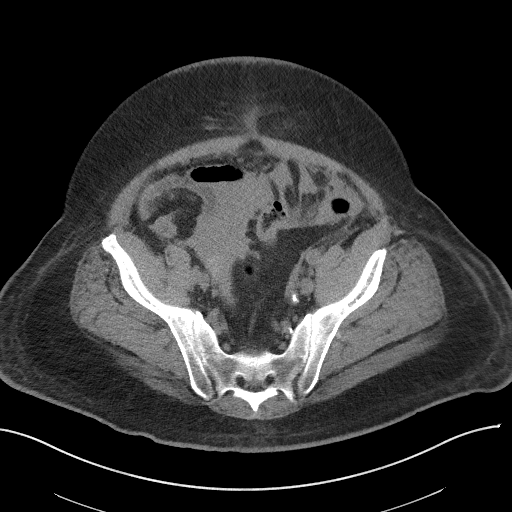
[im 39/109  soft-tissue]
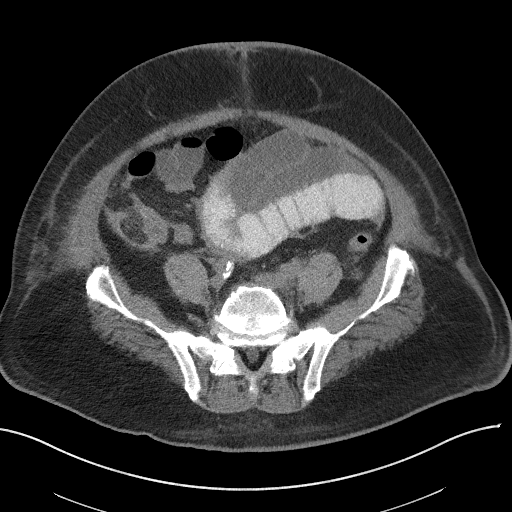
[im 48/109  soft-tissue]
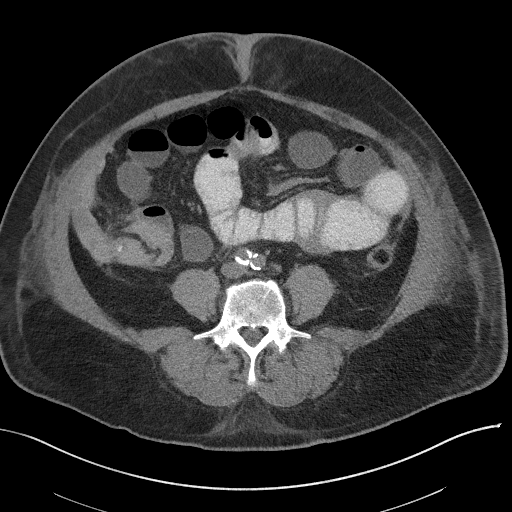
[im 57/109  soft-tissue]
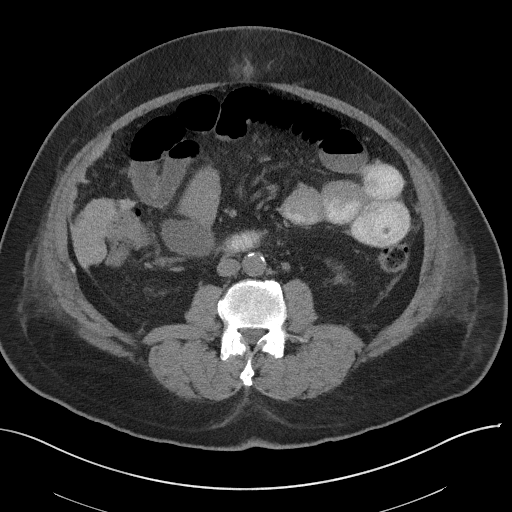
[im 61/109  soft-tissue]
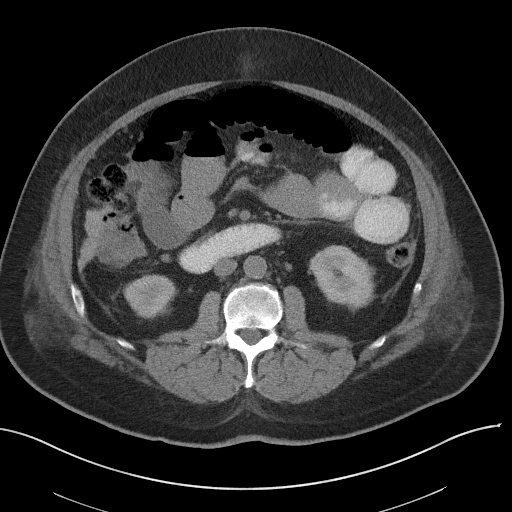
[im 70/109  soft-tissue]
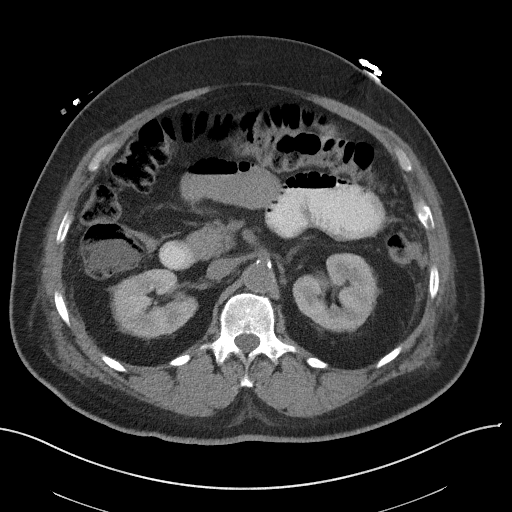
[im 70/109  bone]
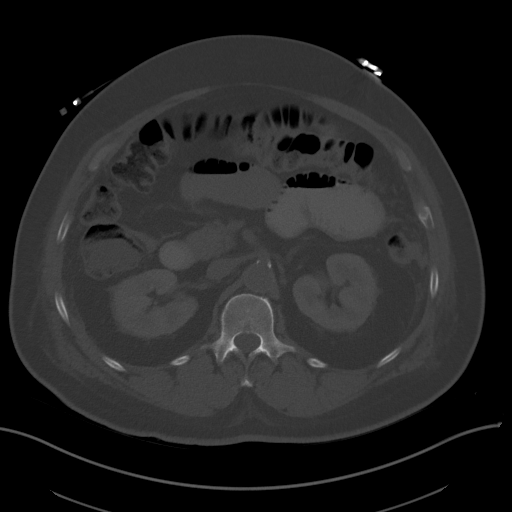
[im 78/109  soft-tissue]
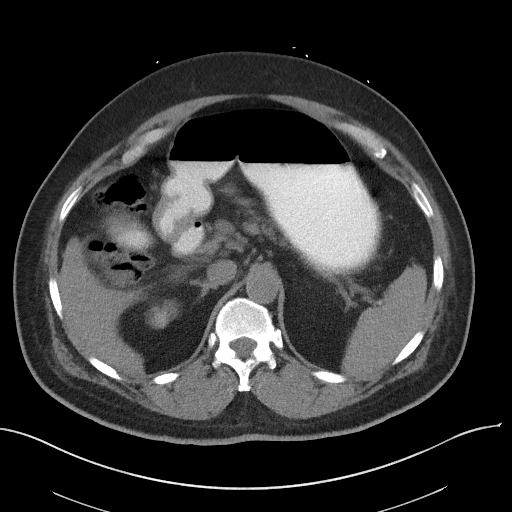
[im 87/109  soft-tissue]
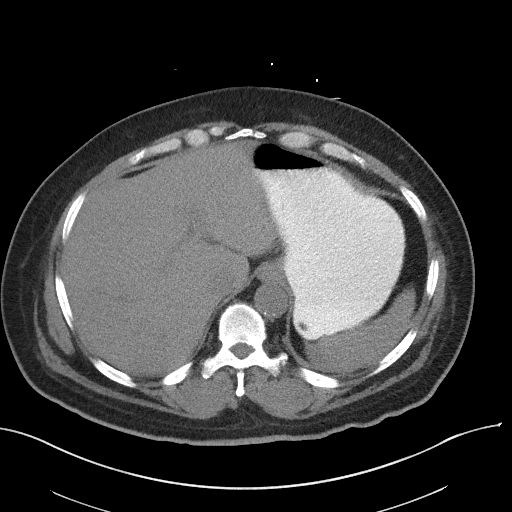
[im 96/109  soft-tissue]
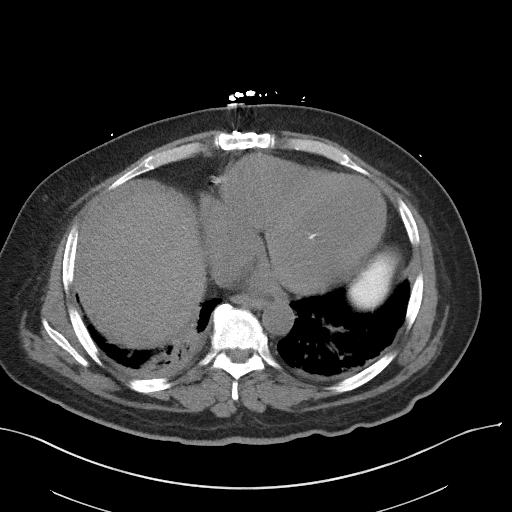
[im 104/109  soft-tissue]
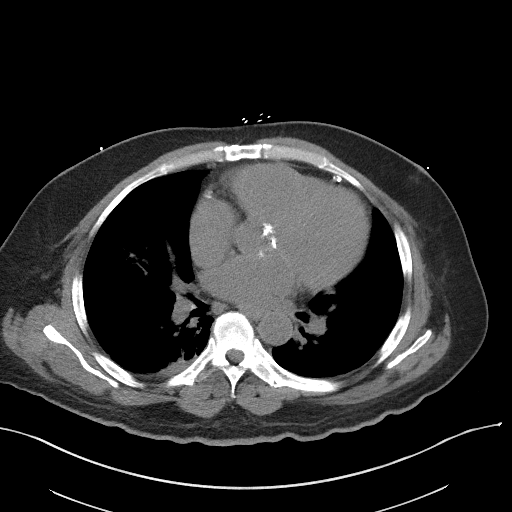

[Series 5: coronal st · coronal · 0.81mm/px · 3 of 125 slices shown]
[im 42/125  soft-tissue]
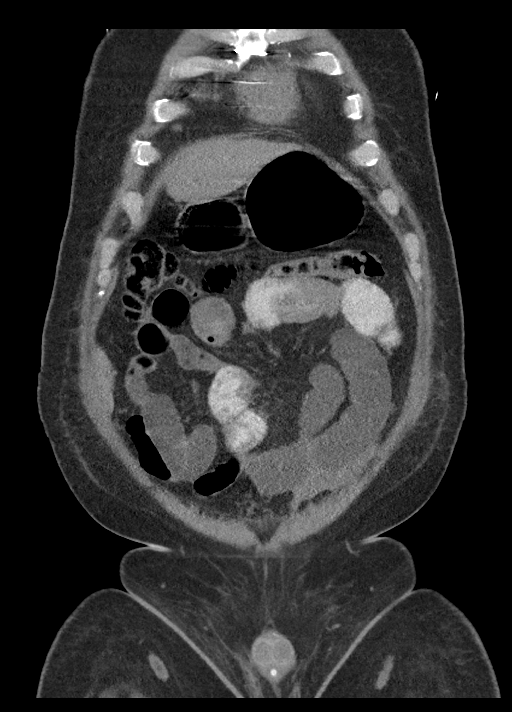
[im 56/125  soft-tissue]
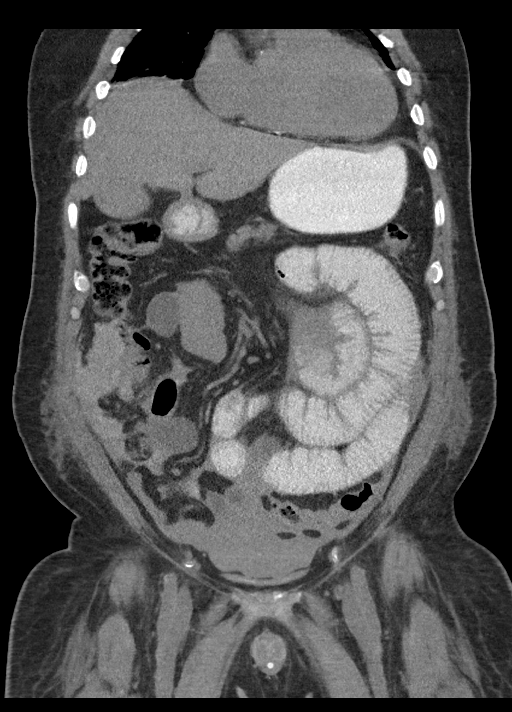
[im 69/125  soft-tissue]
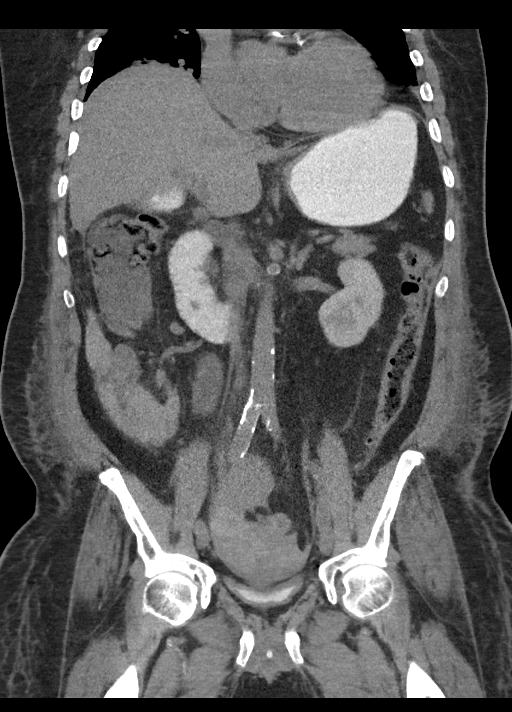

[16 of 46 positions shown; findings below may reference images not displayed]

FINDINGS: Lower chest: Cardiomegaly. Bibasilar atelectasis. Coronary artery
calcifications, status post CABG.

Hepatobiliary: Layering gallstones within the bladder. No focal
hepatic abnormality.

Pancreas: No focal abnormality or ductal dilatation.

Spleen: No focal abnormality.  Normal size.

Adrenals/Urinary Tract: Foley catheter present in the bladder which
is decompressed. No hydronephrosis. Adrenal glands unremarkable.

Stomach/Bowel: Dilated small bowel loops. Distal small bowel loops
and colon are decompressed. Mild distention of the stomach with
contrast.

Vascular/Lymphatic: Aortic atherosclerosis. No enlarged abdominal or
pelvic lymph nodes.

Reproductive: Mild prostate prominence.

Other: Moderate complex free fluid within the abdomen and pelvis,
possibly hemoperitoneum. No free air.

Musculoskeletal: No acute bony abnormality. Pars defects and
degenerative changes at L5 S1.
IMPRESSION: Postop from appendectomy. There are mildly dilated small bowel
loops. Distal small bowel loops are decompressed as is the colon.
This could reflect ileus or small-bowel obstruction.

Moderate complex fluid in the abdomen and pelvis, most notable in
the pelvis. This may reflect blood/hemoperitoneum.

Aortic atherosclerosis.

Bibasilar atelectasis.

Prostate enlargement.

## 2018-09-11 MED ORDER — SODIUM CHLORIDE 0.9% IV SOLUTION
Freq: Once | INTRAVENOUS | Status: AC
  Administered 2018-09-11: 17:00:00 via INTRAVENOUS

## 2018-09-11 MED ORDER — ISOSORBIDE MONONITRATE ER 30 MG PO TB24
60.0000 mg | ORAL_TABLET | Freq: Every day | ORAL | Status: DC
  Administered 2018-09-11 – 2018-09-13 (×3): 60 mg via ORAL
  Filled 2018-09-11 (×3): qty 2

## 2018-09-11 MED ORDER — DEXTROSE 50 % IV SOLN: 1.0000 | INTRAVENOUS | Status: DC | PRN

## 2018-09-11 MED ORDER — INSULIN GLARGINE 100 UNIT/ML ~~LOC~~ SOLN
10.0000 [IU] | Freq: Two times a day (BID) | SUBCUTANEOUS | Status: DC
  Administered 2018-09-11 – 2018-09-16 (×9): 10 [IU] via SUBCUTANEOUS
  Filled 2018-09-11 (×15): qty 0.1

## 2018-09-11 MED ORDER — ALBUMIN HUMAN 25 % IV SOLN
12.5000 g | Freq: Once | INTRAVENOUS | Status: AC
  Administered 2018-09-11: 12.5 g via INTRAVENOUS
  Filled 2018-09-11: qty 50

## 2018-09-11 MED ORDER — IOHEXOL 240 MG/ML SOLN
25.0000 mL | INTRAMUSCULAR | Status: AC
  Administered 2018-09-11 (×2): 25 mL via ORAL

## 2018-09-11 NOTE — Progress Notes (Signed)
[facility] Hospital Day(s): 1.   Post op day(s): 3 Days Post-Op.   Interval History: Patient seen and examined, he remains very weak and somewhat debilitated. Still with significant abdominal distension, burping, and oliguria. He denied any fever, chills, nausea, emesis. He denied passing any flatus. He has remained NPO. This morning, he was found to be further anemic (hgb 7.3) without and signs of exsanguination (VSS) and his sCr/BUN again elevated (7.02/90) and he was profoundly hyperphosphatemia (7.0).    Review of Systems:  Constitutional: denies fever, chills  Respiratory: denies any shortness of breath  Cardiovascular: denies chest pain or palpitations  Gastrointestinal: + distension, denies abdominal pain, N/V, or diarrhea/and bowel function as per interval history Genitourinary: + Oliguria Integumentary: denies any other rashes or skin discolorations except laparoscopic incisions   Vital signs in last 24 hours: [min-max] current  Temp:  [98.2 F (36.8 C)-99.1 F (37.3 C)] 98.9 F (37.2 C) (04/10 0705) Pulse Rate:  [93-95] 95 (04/10 0705) Resp:  [16-20] 16 (04/10 0705) BP: (115-128)/(64-72) 128/64 (04/10 0705) SpO2:  [94 %-98 %] 97 % (04/10 0705)     Height: 6\' 1"  (185.4 cm) Weight: 110.2 kg BMI (Calculated): 32.07   Intake/Output this shift:  No intake/output data recorded.    Physical Exam:  Constitutional: alert, cooperative and no distress  Respiratory: breathing non-labored at rest  Cardiovascular: regular rate and sinus rhythm  Gastrointestinal: soft, non-tender, significant distension, tympanic to percussion, no rebound/guarding  Genitourinary: Foley in place Integumentary: Laparoscopic incisions are CDI, no erythema or drainage   Labs:  CBC Latest Ref Rng & Units 09/11/2018 09/10/2018 09/09/2018  WBC 4.0 - 10.5 K/uL 21.4(H) 22.9(H) 24.0(H)  Hemoglobin 13.0 - 17.0 g/dL 7.3(L) 8.2(L) 9.8(L)  Hematocrit 39.0 - 52.0 %  23.3(L) 26.6(L) 31.1(L)  Platelets 150 - 400 K/uL 166 164 201   CMP Latest Ref Rng & Units 09/11/2018 09/10/2018 09/09/2018  Glucose 70 - 99 mg/dL 167(H) 134(H) 181(H)  BUN 8 - 23 mg/dL 90(H) 71(H) 47(H)  Creatinine 0.61 - 1.24 mg/dL 7.02(H) 5.15(H) 2.80(H)  Sodium 135 - 145 mmol/L 141 143 145  Potassium 3.5 - 5.1 mmol/L 4.9 5.2(H) 4.9  Chloride 98 - 111 mmol/L 106 110 112(H)  CO2 22 - 32 mmol/L 22 19(L) 22  Calcium 8.9 - 10.3 mg/dL 7.8(L) 7.8(L) 8.1(L)  Total Protein 6.5 - 8.1 g/dL - - -  Total Bilirubin 0.3 - 1.2 mg/dL - - -  Alkaline Phos 38 - 126 U/L - - -  AST 15 - 41 U/L - - -  ALT 0 - 44 U/L - - -    Imaging studies: No new pertinent imaging studies   Assessment/Plan: (ICD-10's: K35.80) 63 y.o. debilitated/weal male with a multitude of issues currently. He continues to have significant oliguria and elevated sCr thought to attributable to contrast nephropathy in addition to hyperphosphatemia which is likely attributable to renal failure. Additionally, he continues to have gradually worsening anemia to 7.3 this morning. He does not have any signs of active exsanguination and his vitals are stable but he is quite weak and debilitated currently. Furthermore, he still appears quite distended and is actively belching without signs of bowel function return. His leukocytosis has improved mildly and he does not have any pain but this is a complex situation 3 Days Post-Op s/p laparoscopic appendectomy for acute appendicitis.   - Continue NPO, IVF, IV Abx (Zosyn)   - Pain control prn; antiemetics prn             -  Monitor abdominal examination; urine output; ongoing bowel function  - Will Transfuse 1 unit pRBCs given anemia/weakness; monitor  - Monitor renal function; worsening; nephrology following  - Monitor leukocytosis  - Will obtain CT Abdomen/Pelvis with PO contrast only   - Medical management of comorbidities - Encourage mobilization - Holding DVT  prophylaxis  All of the above findings and recommendations were discussed with the patient, and the medical team, and all of patient's questions were answered tohis expressed satisfaction.  Dr Dahlia Byes also talked extensively to the patient's wife and obtained further history as well as updated her on the clinical condition and plan of care.   -- Edison Simon, PA-C [facility] 09/11/2018, 7:12 AM [phone] M-F: 7am - 4pm

## 2018-09-11 NOTE — Progress Notes (Signed)
IV RN placed 20g (1.88 in) IV in left upper arm.  Recommendations are: if additional vascular access is needed due to elevated creatinine a tunneled line would be most appropriate via IR

## 2018-09-11 NOTE — Progress Notes (Signed)
Patient needed warm autoantibody treatment as a result of an incompatible crossmatch. Conditional blood release request form requires MD signature. Unfortunately, MD has left for the day and is unable to provide signature however he does provide verbal permission to this RN to proceed with transfusion. Md also willing to speak with pathologist to provide verbal agreement to transfuse. This RN spoke with Dr Saralyn Pilar who is on call pathologist who is in agreement with transfusion. Unit is allocated and at this time we will transfuse per protocol. Nursing staff will closely monitor patient for adverse reactions.

## 2018-09-11 NOTE — Progress Notes (Signed)
[facility]  ROUNDING NOTE   Subjective:   Wbc 21.9 (22.9)  hgb 7.3 - PRBC transfusion ordered  Creatinine 7 (5.15) BUN 90 (71)  Objective:  Vital signs in last 24 hours:  Temp:  [98.7 F (37.1 C)-99.3 F (37.4 C)] 99.3 F (37.4 C) (04/10 1417) Pulse Rate:  [86-95] 86 (04/10 1417) Resp:  [16-20] 18 (04/10 1417) BP: (124-129)/(64-73) 129/73 (04/10 1417) SpO2:  [94 %-97 %] 94 % (04/10 1417)  Weight change:  Filed Weights   09/08/18 1239  Weight: 110.2 kg    Intake/Output: I/O last 3 completed shifts: In: 1713.8 [I.V.:1477.4; IV Piggyback:236.4] Out: 108 [FGHWE:993]   Intake/Output this shift:  Total I/O In: 473 [P.O.:473] Out: 50 [Urine:50]  Physical Exam: General: NAD,   Head: Normocephalic, atraumatic. Moist oral mucosal membranes  Eyes: Anicteric, PERRL  Neck: Supple, trachea midline  Lungs:  Clear to auscultation  Heart: Regular rate and rhythm  Abdomen:  Soft, nontender,   Extremities: no peripheral edema.  Neurologic: Nonfocal, moving all four extremities, alert but slow to answer  Skin: No lesions  Access: none    Basic Metabolic Panel: Recent Labs  Lab 09/08/18 1315 09/09/18 0317 09/10/18 0439 09/11/18 0300  NA 143 145 143 141  K 4.1 4.9 5.2* 4.9  CL 108 112* 110 106  CO2 23 22 19* 22  GLUCOSE 153* 181* 134* 167*  BUN 37* 47* 71* 90*  CREATININE 1.89* 2.80* 5.15* 7.02*  CALCIUM 8.6* 8.1* 7.8* 7.8*  MG  --   --   --  2.2  PHOS  --   --   --  7.0*    Liver Function Tests: Recent Labs  Lab 09/08/18 1315  AST 18  ALT 12  ALKPHOS 76  BILITOT 1.0  PROT 7.4  ALBUMIN 4.0   Recent Labs  Lab 09/08/18 1315  LIPASE 28   No results for input(s): AMMONIA in the last 168 hours.  CBC: Recent Labs  Lab 09/08/18 1315 09/09/18 0317 09/10/18 0439 09/11/18 0300  WBC 13.9* 24.0* 22.9* 21.4*  HGB 11.3* 9.8* 8.2* 7.3*  HCT 34.4* 31.1* 26.6* 23.3*  MCV 84.3 88.4 89.9 89.3  PLT 157 201 164 166    Cardiac Enzymes: No  results for input(s): CKTOTAL, CKMB, CKMBINDEX, TROPONINI in the last 168 hours.  BNP: Invalid input(s): POCBNP  CBG: Recent Labs  Lab 09/10/18 1125 09/10/18 1629 09/10/18 2117 09/11/18 0738 09/11/18 1145  GLUCAP 136* 136* 133* 183* 158*    Microbiology: Results for orders placed or performed during the hospital encounter of 03/13/18  Aerobic Culture (superficial specimen)     Status: None   Collection Time: 03/13/18 10:06 AM  Result Value Ref Range Status   Specimen Description   Final    WOUND Performed at [facility], [address] [place], [place] [address]    Special Requests   Final    NONE Performed at [facility], [address] [place], [place] [address]    Gram Stain   Final    RARE WBC PRESENT,BOTH PMN AND MONONUCLEAR RARE GRAM POSITIVE COCCI Performed at [facility], [place] [address] [place], [place] [address]    Culture   Final    FEW ENTEROBACTER CLOACAE FEW VIRIDANS STREPTOCOCCUS FEW SERRATIA MARCESCENS    Report Status 03/17/2018 FINAL  Final   Organism ID, Bacteria ENTEROBACTER CLOACAE  Final   Organism ID, Bacteria SERRATIA MARCESCENS  Final      Susceptibility   Enterobacter  cloacae - MIC*    CEFAZOLIN >=64 RESISTANT Resistant     CEFEPIME <=1 SENSITIVE Sensitive     CEFTAZIDIME <=1 SENSITIVE Sensitive     CEFTRIAXONE <=1 SENSITIVE Sensitive     CIPROFLOXACIN <=0.25 SENSITIVE Sensitive     GENTAMICIN <=1 SENSITIVE Sensitive     IMIPENEM <=0.25 SENSITIVE Sensitive     TRIMETH/SULFA <=20 SENSITIVE Sensitive     PIP/TAZO 16 SENSITIVE Sensitive     * FEW ENTEROBACTER CLOACAE   Serratia marcescens - MIC*    CEFAZOLIN >=64 RESISTANT Resistant     CEFEPIME <=1 SENSITIVE Sensitive     CEFTAZIDIME <=1 SENSITIVE Sensitive     CEFTRIAXONE <=1 SENSITIVE Sensitive     CIPROFLOXACIN <=0.25 SENSITIVE Sensitive     GENTAMICIN <=1 SENSITIVE Sensitive     TRIMETH/SULFA <=20 SENSITIVE Sensitive     * FEW SERRATIA MARCESCENS     Coagulation Studies: No results for input(s): LABPROT, INR in the last 72 hours.  Urinalysis: Recent Labs    09/10/18 0441  COLORURINE AMBER*  LABSPEC 1.030  PHURINE 5.0  GLUCOSEU NEGATIVE  HGBUR MODERATE*  BILIRUBINUR NEGATIVE  KETONESUR NEGATIVE  PROTEINUR 30*  NITRITE NEGATIVE  LEUKOCYTESUR TRACE*      Imaging: Ct Abdomen Pelvis Wo Contrast  Result Date: 09/11/2018 CLINICAL DATA:  Postop appendectomy.  Abdominal distention. EXAM: CT ABDOMEN AND PELVIS WITHOUT CONTRAST TECHNIQUE: Multidetector CT imaging of the abdomen and pelvis was performed following the standard protocol without IV contrast. COMPARISON:  09/08/2018 FINDINGS: Lower chest: Cardiomegaly. Bibasilar atelectasis. Coronary artery calcifications, status post CABG. Hepatobiliary: Layering gallstones within the bladder. No focal hepatic abnormality. Pancreas: No focal abnormality or ductal dilatation. Spleen: No focal abnormality.  Normal size. Adrenals/Urinary Tract: Foley catheter present in the bladder which is decompressed. No hydronephrosis. Adrenal glands unremarkable. Stomach/Bowel: Dilated small bowel loops. Distal small bowel loops and colon are decompressed. Mild distention of the stomach with contrast. Vascular/Lymphatic: Aortic atherosclerosis. No enlarged abdominal or pelvic lymph nodes. Reproductive: Mild prostate prominence. Other: Moderate complex free fluid within the abdomen and pelvis, possibly hemoperitoneum. No free air. Musculoskeletal: No acute bony abnormality. Pars defects and degenerative changes at L5 S1. IMPRESSION: Postop from appendectomy. There are mildly dilated small bowel loops. Distal small bowel loops are decompressed as is the colon. This could reflect ileus or small-bowel obstruction. Moderate complex fluid in the abdomen and pelvis, most notable in the pelvis. This may reflect blood/hemoperitoneum. Aortic atherosclerosis. Bibasilar atelectasis. Prostate enlargement. Electronically  Signed   By: Rolm Baptise M.D.   On: 09/11/2018 10:18   Dg Abd 1 View  Result Date: 09/10/2018 CLINICAL DATA:  Abdominal distention EXAM: ABDOMEN - 1 VIEW COMPARISON:  09/08/2018 abdominal CT FINDINGS: Gas dilated small bowel with less colonic gas. No concerning mass effect or gas collection. Lung bases are clear. IMPRESSION: Dilated small bowel. Recent surgery suggests ileus, but there is less colonic gas and a degree of mechanical obstruction is also possible. Electronically Signed   By: Monte Fantasia M.D.   On: 09/10/2018 07:25     Medications:   . sodium chloride 250 mL (09/10/18 0839)  . piperacillin-tazobactam (ZOSYN)  IV 3.375 g (09/11/18 1045)  .  sodium bicarbonate  infusion 1000 mL 75 mL/hr at 09/11/18 1309   . sodium chloride   Intravenous Once  . acetaminophen  1,000 mg Oral Q6H  . carvedilol  6.25 mg Oral BID WC  . insulin aspart  0-15 Units Subcutaneous TID WC  . insulin aspart  0-5  Units Subcutaneous QHS  . insulin aspart  4 Units Subcutaneous TID WC  . insulin detemir  80 Units Subcutaneous QHS  . isosorbide mononitrate  60 mg Oral Daily  . pantoprazole (PROTONIX) IV  40 mg Intravenous QHS  . polyethylene glycol  17 g Oral Daily   sodium chloride, hydrALAZINE, HYDROmorphone (DILAUDID) injection, nitroGLYCERIN, ondansetron **OR** ondansetron (ZOFRAN) IV, oxyCODONE, prochlorperazine **OR** prochlorperazine, traMADol  Assessment/ Plan:  Phillip [name] is a 63 y.o. black male with diabetes mellitus type II, diabetic neuropathy, hypertension, coronary artery disease status post CABG, peripheral vascular disease, status post left toe amputation, congestive heart failure, asthma, who was admitted to [facility] on 09/08/2018 for Acute appendicitis   1. Acute renal failure on chronic kidney disease stage III with proteinuria: baseline creatinine of 2.23, GFR of 35 on 08/10/2018.  Chronic Kidney Disease secondary to diabetic nephropathy Acute renal failure secondary to ATN from IV  contrast nephropathy and possibly from acute illness, acute appendicitis.  No indication for dialysis.  - Holding lisinopril, furosemide, spironolactone, potassium chloride, and metolazone.  - Continue IVF: sodium bicarbonate infusion.  - renally dose pip/tazo - If no improvement, patient may need dialysis.   2. Hypertension:   - hydralazine, carvedilol, isosorbide mononitrate  3. Diabetes mellitus type II with chronic kidney disease: insulin dependent. not well controlled. Hemoglobin A1c of 9.1% on 01/14/18   LOS: 1 Mycah Formica 4/10/20202:35 PM

## 2018-09-11 NOTE — Progress Notes (Signed)
Spoke with wife on phone with permission from patient. Updated her on his progress. She wants to know if family members can donate blood for transfusion. I told her probably not but transferred her to blood bank for help with this question

## 2018-09-11 NOTE — Progress Notes (Signed)
Blood bank advised that patient has antibodies and they will need to special order his blood. Dr. Dahlia Byes notified. Difficult IV start, IV team consulted. Explained blood transfusion and pt. Signed consent;

## 2018-09-12 LAB — URINALYSIS, ROUTINE W REFLEX MICROSCOPIC
Bilirubin Urine: NEGATIVE
Glucose, UA: NEGATIVE mg/dL
Ketones, ur: NEGATIVE mg/dL
Leukocytes,Ua: NEGATIVE
Nitrite: NEGATIVE
Protein, ur: 30 mg/dL — AB
Specific Gravity, Urine: 1.024 (ref 1.005–1.030)
Squamous Epithelial / LPF: NONE SEEN (ref 0–5)
pH: 5 (ref 5.0–8.0)

## 2018-09-12 LAB — CBC
HCT: 26.2 % — ABNORMAL LOW (ref 39.0–52.0)
Hemoglobin: 8.3 g/dL — ABNORMAL LOW (ref 13.0–17.0)
MCH: 27.8 pg (ref 26.0–34.0)
MCHC: 31.7 g/dL (ref 30.0–36.0)
MCV: 87.6 fL (ref 80.0–100.0)
Platelets: 170 10*3/uL (ref 150–400)
RBC: 2.99 MIL/uL — ABNORMAL LOW (ref 4.22–5.81)
RDW: 15.5 % (ref 11.5–15.5)
WBC: 16.1 10*3/uL — ABNORMAL HIGH (ref 4.0–10.5)
nRBC: 0.1 % (ref 0.0–0.2)

## 2018-09-12 LAB — GLUCOSE, CAPILLARY
Glucose-Capillary: 155 mg/dL — ABNORMAL HIGH (ref 70–99)
Glucose-Capillary: 157 mg/dL — ABNORMAL HIGH (ref 70–99)
Glucose-Capillary: 182 mg/dL — ABNORMAL HIGH (ref 70–99)
Glucose-Capillary: 162 mg/dL — ABNORMAL HIGH (ref 70–99)
Glucose-Capillary: 140 mg/dL — ABNORMAL HIGH (ref 70–99)

## 2018-09-12 LAB — COMPREHENSIVE METABOLIC PANEL
ALT: 31 U/L (ref 0–44)
AST: 25 U/L (ref 15–41)
Albumin: 3.1 g/dL — ABNORMAL LOW (ref 3.5–5.0)
Alkaline Phosphatase: 43 U/L (ref 38–126)
Anion gap: 16 — ABNORMAL HIGH (ref 5–15)
BUN: 106 mg/dL — ABNORMAL HIGH (ref 8–23)
CO2: 25 mmol/L (ref 22–32)
Calcium: 7.8 mg/dL — ABNORMAL LOW (ref 8.9–10.3)
Chloride: 100 mmol/L (ref 98–111)
Creatinine, Ser: 7.93 mg/dL — ABNORMAL HIGH (ref 0.61–1.24)
GFR calc Af Amer: 8 mL/min — ABNORMAL LOW (ref >60)
GFR calc non Af Amer: 7 mL/min — ABNORMAL LOW (ref >60)
Glucose, Bld: 161 mg/dL — ABNORMAL HIGH (ref 70–99)
Potassium: 4.4 mmol/L (ref 3.5–5.1)
Sodium: 141 mmol/L (ref 135–145)
Total Bilirubin: 1 mg/dL (ref 0.3–1.2)
Total Protein: 7 g/dL (ref 6.5–8.1)

## 2018-09-12 LAB — PROTEIN, URINE, RANDOM: Total Protein, Urine: 107 mg/dL

## 2018-09-12 LAB — NA AND K (SODIUM & POTASSIUM), RAND UR
Potassium Urine: 58 mmol/L
Sodium, Ur: 29 mmol/L

## 2018-09-12 LAB — PHOSPHORUS: Phosphorus: 7.4 mg/dL — ABNORMAL HIGH (ref 2.5–4.6)

## 2018-09-12 LAB — CREATININE, URINE, RANDOM: Creatinine, Urine: 188 mg/dL

## 2018-09-12 NOTE — Progress Notes (Signed)
[facility]  ROUNDING NOTE   Subjective:   Patient states he is feeling better. Still lethargic and tired. Bowel movement last night.   UOP 290  Tmax 99.1  Bicarb at 61mL/hr  Creatinine 7.93 (7.02) (5.15) BUN 106 (90) (71)  Objective:  Vital signs in last 24 hours:  Temp:  [98.5 F (36.9 C)-99.3 F (37.4 C)] 98.5 F (36.9 C) (04/11 0448) Pulse Rate:  [86-100] 96 (04/11 0448) Resp:  [16-20] 20 (04/11 0448) BP: (129-153)/(72-83) 153/81 (04/11 0448) SpO2:  [93 %-96 %] 93 % (04/11 0448)  Weight change:  Filed Weights   09/08/18 1239  Weight: 110.2 kg    Intake/Output: I/O last 3 completed shifts: In: 2218 [P.O.:733; I.V.:860; Blood:450; IV Piggyback:175] Out: 4 [Urine:340; Emesis/NG output:150]   Intake/Output this shift:  Total I/O In: -  Out: 220 [Urine:220]  Physical Exam: General: NAD,   Head: Normocephalic, atraumatic. Moist oral mucosal membranes  Eyes: Anicteric, PERRL  Neck: Supple, trachea midline  Lungs:  Clear to auscultation  Heart: Regular rate and rhythm  Abdomen:  Soft, nontender,   Extremities: no peripheral edema.  Neurologic: Nonfocal, moving all four extremities, alert but slow to answer  Skin: No lesions  Access: none    Basic Metabolic Panel: Recent Labs  Lab 09/08/18 1315 09/09/18 0317 09/10/18 0439 09/11/18 0300 09/12/18 0526  NA 143 145 143 141 141  K 4.1 4.9 5.2* 4.9 4.4  CL 108 112* 110 106 100  CO2 23 22 19* 22 25  GLUCOSE 153* 181* 134* 167* 161*  BUN 37* 47* 71* 90* 106*  CREATININE 1.89* 2.80* 5.15* 7.02* 7.93*  CALCIUM 8.6* 8.1* 7.8* 7.8* 7.8*  MG  --   --   --  2.2  --   PHOS  --   --   --  7.0* 7.4*    Liver Function Tests: Recent Labs  Lab 09/08/18 1315 09/12/18 0526  AST 18 25  ALT 12 31  ALKPHOS 76 43  BILITOT 1.0 1.0  PROT 7.4 7.0  ALBUMIN 4.0 3.1*   Recent Labs  Lab 09/08/18 1315  LIPASE 28   No results for input(s): AMMONIA in the last 168 hours.  CBC: Recent Labs  Lab  09/08/18 1315 09/09/18 0317 09/10/18 0439 09/11/18 0300 09/11/18 2326 09/12/18 0526  WBC 13.9* 24.0* 22.9* 21.4*  --  16.1*  HGB 11.3* 9.8* 8.2* 7.3* 8.2* 8.3*  HCT 34.4* 31.1* 26.6* 23.3* 25.3* 26.2*  MCV 84.3 88.4 89.9 89.3  --  87.6  PLT 157 201 164 166  --  170    Cardiac Enzymes: No results for input(s): CKTOTAL, CKMB, CKMBINDEX, TROPONINI in the last 168 hours.  BNP: Invalid input(s): POCBNP  CBG: Recent Labs  Lab 09/11/18 1145 09/11/18 1633 09/11/18 2144 09/12/18 0449 09/12/18 0743  GLUCAP 158* 122* 121* 155* 157*    Microbiology: Results for orders placed or performed during the hospital encounter of 03/13/18  Aerobic Culture (superficial specimen)     Status: None   Collection Time: 03/13/18 10:06 AM  Result Value Ref Range Status   Specimen Description   Final    WOUND Performed at [facility], [address] [place], [place] [address]    Special Requests   Final    NONE Performed at [facility], [address] [place], [place] [address]    Gram Stain   Final    RARE WBC PRESENT,BOTH PMN AND MONONUCLEAR RARE GRAM POSITIVE COCCI Performed at [facility]  [facility], [address], [place], [place] [address]    Culture   Final    FEW ENTEROBACTER CLOACAE FEW VIRIDANS STREPTOCOCCUS FEW SERRATIA MARCESCENS    Report Status 03/17/2018 FINAL  Final   Organism ID, Bacteria ENTEROBACTER CLOACAE  Final   Organism ID, Bacteria SERRATIA MARCESCENS  Final      Susceptibility   Enterobacter cloacae - MIC*    CEFAZOLIN >=64 RESISTANT Resistant     CEFEPIME <=1 SENSITIVE Sensitive     CEFTAZIDIME <=1 SENSITIVE Sensitive     CEFTRIAXONE <=1 SENSITIVE Sensitive     CIPROFLOXACIN <=0.25 SENSITIVE Sensitive     GENTAMICIN <=1 SENSITIVE Sensitive     IMIPENEM <=0.25 SENSITIVE Sensitive     TRIMETH/SULFA <=20 SENSITIVE Sensitive     PIP/TAZO 16 SENSITIVE Sensitive     * FEW ENTEROBACTER CLOACAE   Serratia marcescens - MIC*    CEFAZOLIN  >=64 RESISTANT Resistant     CEFEPIME <=1 SENSITIVE Sensitive     CEFTAZIDIME <=1 SENSITIVE Sensitive     CEFTRIAXONE <=1 SENSITIVE Sensitive     CIPROFLOXACIN <=0.25 SENSITIVE Sensitive     GENTAMICIN <=1 SENSITIVE Sensitive     TRIMETH/SULFA <=20 SENSITIVE Sensitive     * FEW SERRATIA MARCESCENS    Coagulation Studies: No results for input(s): LABPROT, INR in the last 72 hours.  Urinalysis: Recent Labs    09/10/18 0441 09/12/18 0513  COLORURINE AMBER* YELLOW*  LABSPEC 1.030 1.024  PHURINE 5.0 5.0  GLUCOSEU NEGATIVE NEGATIVE  HGBUR MODERATE* MODERATE*  BILIRUBINUR NEGATIVE NEGATIVE  KETONESUR NEGATIVE NEGATIVE  PROTEINUR 30* 30*  NITRITE NEGATIVE NEGATIVE  LEUKOCYTESUR TRACE* NEGATIVE      Imaging: Ct Abdomen Pelvis Wo Contrast  Result Date: 09/11/2018 CLINICAL DATA:  Postop appendectomy.  Abdominal distention. EXAM: CT ABDOMEN AND PELVIS WITHOUT CONTRAST TECHNIQUE: Multidetector CT imaging of the abdomen and pelvis was performed following the standard protocol without IV contrast. COMPARISON:  09/08/2018 FINDINGS: Lower chest: Cardiomegaly. Bibasilar atelectasis. Coronary artery calcifications, status post CABG. Hepatobiliary: Layering gallstones within the bladder. No focal hepatic abnormality. Pancreas: No focal abnormality or ductal dilatation. Spleen: No focal abnormality.  Normal size. Adrenals/Urinary Tract: Foley catheter present in the bladder which is decompressed. No hydronephrosis. Adrenal glands unremarkable. Stomach/Bowel: Dilated small bowel loops. Distal small bowel loops and colon are decompressed. Mild distention of the stomach with contrast. Vascular/Lymphatic: Aortic atherosclerosis. No enlarged abdominal or pelvic lymph nodes. Reproductive: Mild prostate prominence. Other: Moderate complex free fluid within the abdomen and pelvis, possibly hemoperitoneum. No free air. Musculoskeletal: No acute bony abnormality. Pars defects and degenerative changes at L5 S1.  IMPRESSION: Postop from appendectomy. There are mildly dilated small bowel loops. Distal small bowel loops are decompressed as is the colon. This could reflect ileus or small-bowel obstruction. Moderate complex fluid in the abdomen and pelvis, most notable in the pelvis. This may reflect blood/hemoperitoneum. Aortic atherosclerosis. Bibasilar atelectasis. Prostate enlargement. Electronically Signed   By: Rolm Baptise M.D.   On: 09/11/2018 10:18     Medications:   . sodium chloride 250 mL (09/11/18 2352)  . piperacillin-tazobactam (ZOSYN)  IV 3.375 g (09/12/18 0955)  .  sodium bicarbonate  infusion 1000 mL 75 mL/hr at 09/12/18 0504   . acetaminophen  1,000 mg Oral Q6H  . carvedilol  6.25 mg Oral BID WC  . insulin aspart  0-15 Units Subcutaneous TID WC  . insulin aspart  0-5 Units Subcutaneous QHS  . insulin aspart  4 Units Subcutaneous TID WC  .  insulin glargine  10 Units Subcutaneous BID  . isosorbide mononitrate  60 mg Oral Daily  . pantoprazole (PROTONIX) IV  40 mg Intravenous QHS  . polyethylene glycol  17 g Oral Daily   sodium chloride, dextrose, hydrALAZINE, HYDROmorphone (DILAUDID) injection, nitroGLYCERIN, ondansetron **OR** ondansetron (ZOFRAN) IV, oxyCODONE, prochlorperazine **OR** prochlorperazine, traMADol  Assessment/ Plan:  [name] is a 63 y.o. black male with diabetes mellitus type II, diabetic neuropathy, hypertension, coronary artery disease status post CABG, peripheral vascular disease, status post left toe amputation, congestive heart failure, asthma, who was admitted to [facility] on 09/08/2018 for Acute appendicitis   1. Acute renal failure on chronic kidney disease stage III with proteinuria: baseline creatinine of 2.23, GFR of 35 on 08/10/2018.  Chronic Kidney Disease secondary to diabetic nephropathy Acute renal failure secondary to ATN from IV contrast nephropathy and possibly from acute illness, acute appendicitis.  No indication for dialysis.  - Holding  lisinopril, furosemide, spironolactone, potassium chloride, and metolazone.  - Continue IVF: sodium bicarbonate infusion.  - renally dose pip/tazo - If no improvement, patient may need dialysis. No indication for dialysis at this time, seems as if renal function is starting to plateau.   2. Hypertension:  153/81 - hydralazine, carvedilol, isosorbide mononitrate  3. Diabetes mellitus type II with chronic kidney disease: insulin dependent. not well controlled. Hemoglobin A1c of 9.1% on 01/14/18  4. Anemia with renal failure: status post PRBC transfusion on 4/10.    LOS: 2 Bartolo Montanye 4/11/202011:06 AM

## 2018-09-12 NOTE — Progress Notes (Signed)
Patient seen and examined. Full note to follow.

## 2018-09-12 NOTE — Progress Notes (Signed)
CC: s/p lap appy  Subjective: Feeling better, appropriate response to transfusion Starting to make some urine Creat still up No major complaints, pain improving + flatus and BM Wbc improving avss  Objective: Vital signs in last 24 hours: Temp:  [98.5 F (36.9 C)-99.6 F (37.6 C)] 99.6 F (37.6 C) (04/11 1150) Pulse Rate:  [86-100] 87 (04/11 1150) Resp:  [16-20] 16 (04/11 1150) BP: (129-153)/(72-83) 152/72 (04/11 1150) SpO2:  [93 %-96 %] 95 % (04/11 1150) Last BM Date: 09/12/18  Intake/Output from previous day: 04/10 0701 - 04/11 0700 In: 2218 [P.O.:733; I.V.:860; Blood:450; IV Piggyback:175] Out: 440 [Urine:290; Emesis/NG output:150] Intake/Output this shift: Total I/O In: -  Out: 220 [Urine:220]  Physical exam: Debilitated male Abd: soft, incisions c/d/i, non tender no peritonitis Ext: well perfused and no edema  Lab Results: CBC  Recent Labs    09/11/18 0300 09/11/18 2326 09/12/18 0526  WBC 21.4*  --  16.1*  HGB 7.3* 8.2* 8.3*  HCT 23.3* 25.3* 26.2*  PLT 166  --  170   BMET Recent Labs    09/11/18 0300 09/12/18 0526  NA 141 141  K 4.9 4.4  CL 106 100  CO2 22 25  GLUCOSE 167* 161*  BUN 90* 106*  CREATININE 7.02* 7.93*  CALCIUM 7.8* 7.8*   PT/INR No results for input(s): LABPROT, INR in the last 72 hours. ABG No results for input(s): PHART, HCO3 in the last 72 hours.  Invalid input(s): PCO2, PO2  Studies/Results: Ct Abdomen Pelvis Wo Contrast  Result Date: 09/11/2018 CLINICAL DATA:  Postop appendectomy.  Abdominal distention. EXAM: CT ABDOMEN AND PELVIS WITHOUT CONTRAST TECHNIQUE: Multidetector CT imaging of the abdomen and pelvis was performed following the standard protocol without IV contrast. COMPARISON:  09/08/2018 FINDINGS: Lower chest: Cardiomegaly. Bibasilar atelectasis. Coronary artery calcifications, status post CABG. Hepatobiliary: Layering gallstones within the bladder. No focal hepatic abnormality. Pancreas: No focal abnormality or  ductal dilatation. Spleen: No focal abnormality.  Normal size. Adrenals/Urinary Tract: Foley catheter present in the bladder which is decompressed. No hydronephrosis. Adrenal glands unremarkable. Stomach/Bowel: Dilated small bowel loops. Distal small bowel loops and colon are decompressed. Mild distention of the stomach with contrast. Vascular/Lymphatic: Aortic atherosclerosis. No enlarged abdominal or pelvic lymph nodes. Reproductive: Mild prostate prominence. Other: Moderate complex free fluid within the abdomen and pelvis, possibly hemoperitoneum. No free air. Musculoskeletal: No acute bony abnormality. Pars defects and degenerative changes at L5 S1. IMPRESSION: Postop from appendectomy. There are mildly dilated small bowel loops. Distal small bowel loops are decompressed as is the colon. This could reflect ileus or small-bowel obstruction. Moderate complex fluid in the abdomen and pelvis, most notable in the pelvis. This may reflect blood/hemoperitoneum. Aortic atherosclerosis. Bibasilar atelectasis. Prostate enlargement. Electronically Signed   By: Rolm Baptise M.D.   On: 09/11/2018 10:18    Anti-infectives: Anti-infectives (From admission, onward)   Start     Dose/Rate Route Frequency Ordered Stop   09/10/18 2200  piperacillin-tazobactam (ZOSYN) IVPB 3.375 g     3.375 g 12.5 mL/hr over 240 Minutes Intravenous Every 12 hours 09/10/18 1010     09/09/18 1700  piperacillin-tazobactam (ZOSYN) IVPB 3.375 g  Status:  Discontinued     3.375 g 12.5 mL/hr over 240 Minutes Intravenous Every 8 hours 09/09/18 1309 09/10/18 1010   09/08/18 2100  piperacillin-tazobactam (ZOSYN) IVPB 3.375 g  Status:  Discontinued     3.375 g 12.5 mL/hr over 240 Minutes Intravenous Every 8 hours 09/08/18 1838 09/09/18 1309   09/08/18 1430  piperacillin-tazobactam (ZOSYN) IVPB 3.375 g     3.375 g 100 mL/hr over 30 Minutes Intravenous  Once 09/08/18 1425 09/08/18 1519      Assessment/Plan:  Improving slowly Main issue is  AKI ATN and related to contrast Hospitalist and nephrology assistance is appreciated Will keep a/bs for now until wbc improves Path c/w gangrenous appendicitis D/w the family in detail PT  Caroleen Hamman, MD, FACS  09/12/2018

## 2018-09-12 NOTE — Consult Note (Signed)
[facility] [place] NAME: Phillip [name]    MR#:  [id number]  DATE OF BIRTH:  1955/12/30  DATE OF ADMISSION:  09/08/2018  PRIMARY CARE PHYSICIAN: Cyndi Bender, PA-C   REQUESTING/REFERRING PHYSICIAN: Pabon, Equality F,MD  CHIEF COMPLAINT:   Chief Complaint  Patient presents with   Abdominal Pain    HISTORY OF PRESENT ILLNESS:  Phillip [name]  is a 63 y.o. male with a known history of hypertension, CHF, CAD s/p CABG x 4 diabetes mellitus, PVD s/p stent placement on plavix, hyperlipidemia, obstructive sleep apnea, CKD stage III, anemia of chronic disease, and GERD presenting to the ED on 09/08/2018 with chief complaint of right lower quadrant pain, nausea, fever of 102.5 and chills. Patient report that his symptoms started on 09/05/18 and has been progressive without associated vomiting, diarrhea. Patient reports that pain was worse with movement and nothing seem to make it better.  Initial labs on arrival revealed elevated WBC of  13.9.  CT abdomen and pelvis showed evidence of dilated appendix with appendiceal inflammation consistent with acute appendicitis There is also evidence of ileitis. No evidence of perforation free air or abscess. Given worsening symptoms and concerns for sepsis, patient had a laparoscopic appendectomy for appendicitis done on 09/08/2018. He remains stable however was noted with low hgb of 7.3 therefore was transfused with 1 unit of prbcs. Hospitalist was consulted for management of multiple medical issues.  PAST MEDICAL HISTORY:   Past Medical History:  Diagnosis Date   Asthma    CHF (congestive heart failure) ([place])    Coronary artery disease    Diabetes mellitus without complication (HCC)    Foot ulcer ([place])    left foot   GERD (gastroesophageal reflux disease)    Heart murmur    Hypertension    Kidney insufficiency    Peripheral vascular disease ([place])     PAST SURGICAL HISTOIRY:   Past Surgical  History:  Procedure Laterality Date   AMPUTATION TOE Left 03/13/2018   Procedure: AMPUTATION TOE-MPJ;  Surgeon: Samara Deist, DPM;  Location: [facility] ORS;  Service: Podiatry;  Laterality: Left;   ANGIOPLASTY     CARDIAC CATHETERIZATION     CARDIAC SURGERY     heart bypass (4)   CORONARY ARTERY BYPASS GRAFT     LAPAROSCOPIC APPENDECTOMY N/A 09/08/2018   Procedure: APPENDECTOMY LAPAROSCOPIC;  Surgeon: Jules Husbands, MD;  Location: [facility] ORS;  Service: General;  Laterality: N/A;   LOWER EXTREMITY ANGIOGRAPHY Left 03/02/2018   Procedure: LOWER EXTREMITY ANGIOGRAPHY;  Surgeon: Algernon Huxley, MD;  Location: [facility];  Service: Cardiovascular;  Laterality: Left;   ROTATOR CUFF REPAIR Left     SOCIAL HISTORY:   Social History   Tobacco Use   Smoking status: Former Smoker    Last attempt to quit: 2003    Years since quitting: 17.2   Smokeless tobacco: Never Used  Substance Use Topics   Alcohol use: Not Currently    Comment: 2003    FAMILY HISTORY:   Family History  Problem Relation Age of Onset   Hyperlipidemia Mother    Hypertension Mother    Diabetes Mother    Heart attack Brother    Heart disease Brother     DRUG ALLERGIES:  No Known Allergies  REVIEW OF SYSTEMS:  Review of Systems  Constitutional: Positive for malaise/fatigue. Negative for chills, fever and weight loss.  HENT: Negative for congestion, hearing loss and sore throat.  Eyes: Negative for blurred vision and double vision.  Respiratory: Negative for cough, shortness of breath and wheezing.   Cardiovascular: Negative for chest pain, palpitations, orthopnea and leg swelling.  Gastrointestinal: Negative for abdominal pain, diarrhea, nausea and vomiting.  Genitourinary: Negative for dysuria and urgency.  Musculoskeletal: Negative for myalgias.  Skin: Negative for rash.  Neurological: Negative for dizziness, sensory change, speech change, focal weakness and headaches.    Psychiatric/Behavioral: Negative for depression.    MEDICATIONS AT HOME:   Prior to Admission medications   Medication Sig Start Date End Date Taking? Authorizing Provider  amLODipine (NORVASC) 5 MG tablet Take 5 mg by mouth daily.   Yes [provider]  aspirin 81 MG tablet Take 81 mg by mouth daily.   Yes [provider]  carvedilol (COREG) 6.25 MG tablet Take 6.25 mg by mouth 2 (two) times daily with a meal.  11/14/15  Yes [provider]  cloNIDine (CATAPRES) 0.3 MG tablet Take 0.3 mg by mouth 2 (two) times daily.    Yes [provider]  clopidogrel (PLAVIX) 75 MG tablet Take 1 tablet (75 mg total) by mouth daily. 03/02/18  Yes Dew, Erskine Squibb, MD  doxycycline (VIBRAMYCIN) 100 MG capsule Take 100 mg by mouth 2 (two) times daily. 02/21/18  Yes [provider]  furosemide (LASIX) 40 MG tablet Take 40 mg by mouth 2 (two) times daily.    Yes [provider]  gabapentin (NEURONTIN) 600 MG tablet Take 600 mg by mouth 2 (two) times daily.    Yes [provider]  glipiZIDE (GLUCOTROL XL) 10 MG 24 hr tablet Take 10 mg by mouth daily with breakfast.   Yes [provider]  insulin detemir (LEVEMIR) 100 UNIT/ML injection Inject 40-80 Units into the skin at bedtime. Inject 40 units subcutaneously every morning and 80 units every evening   Yes [provider]  isosorbide mononitrate (IMDUR) 120 MG 24 hr tablet Take 120 mg by mouth daily.   Yes [provider]  lisinopril (PRINIVIL,ZESTRIL) 40 MG tablet Take 40 mg by mouth daily.  11/14/15  Yes [provider]  potassium chloride (KLOR-CON) 20 MEQ packet Take 20 mEq by mouth 2 (two) times daily.   Yes [provider]  spironolactone (ALDACTONE) 25 MG tablet Take 25 mg by mouth daily. 12/24/17  Yes [provider]  vitamin B-12 (CYANOCOBALAMIN) 500 MCG tablet Take 500 mcg by mouth daily.   Yes [provider]  Vitamin D, Ergocalciferol,  (DRISDOL) 1.25 MG (50000 UT) CAPS capsule Take 50,000 Units by mouth once a week. 05/07/18  Yes [provider]  acetaminophen (TYLENOL) 500 MG tablet Take 1,000 mg by mouth every 6 (six) hours as needed for moderate pain or headache.     [provider]  ciprofloxacin (CIPRO) 500 MG tablet Take 500 mg by mouth 2 (two) times daily. 03/19/18   [provider]  doxycycline (VIBRA-TABS) 100 MG tablet Take 1 tablet (100 mg total) by mouth 2 (two) times daily. Patient not taking: Reported on 09/09/2018 03/13/18   Samara Deist, DPM  lovastatin (MEVACOR) 40 MG tablet Take 40 mg by mouth at bedtime.    [provider]  metolazone (ZAROXOLYN) 2.5 MG tablet TAKE 1 TABLET BY MOUTH ON MONDAY [facility] AND FRIDAY 04/07/18   [provider]  nitroGLYCERIN (NITROSTAT) 0.4 MG SL tablet Place 0.4 mg under the tongue every 5 (five) minutes as needed for chest pain.     [provider]  oxyCODONE-acetaminophen (PERCOCET/ROXICET) 5-325 MG tablet Take 1 tablet by mouth every 4 (four) hours as needed.  03/12/18   [provider]      VITAL SIGNS:  Blood pressure (!) 153/81, pulse 96, temperature 98.5 F (36.9 C), temperature source Oral, resp. rate 20, height 6\' 1"  (1.854 m), weight 110.2 kg, SpO2 93 %.  PHYSICAL EXAMINATION:  GENERAL:  63 y.o.-year-old patient lying in the bed with no acute distress.  EYES: Pupils equal, round, reactive to light and accommodation. No scleral icterus. Extraocular muscles intact.  HEENT: Head atraumatic, normocephalic. Oropharynx and nasopharynx clear.  NECK:  Supple, no jugular venous distention. No thyroid enlargement, no tenderness.  LUNGS: Normal breath sounds bilaterally, no wheezing, rales,rhonchi or crepitation. No use of accessory muscles of respiration.   CARDIOVASCULAR: S1, S2 normal. systolic murmurs, rubs, or gallops.  ABDOMEN: Soft, tender to palpation on RLQ. distended. Bowel sounds present. No organomegaly  or mass.  EXTREMITIES: No pedal edema, cyanosis, or clubbing.  NEUROLOGIC: Cranial nerves II through XII are intact. Muscle strength 5/5 in all extremities. Sensation intact. Gait not checked.  PSYCHIATRIC: The patient is alert and oriented x 3.  SKIN: No obvious rash, lesion, or ulcer.  LABORATORY PANEL:   CBC Recent Labs  Lab 09/12/18 0526  WBC 16.1*  HGB 8.3*  HCT 26.2*  PLT 170   ------------------------------------------------------------------------------------------------------------------  Chemistries  Recent Labs  Lab 09/11/18 0300 09/12/18 0526  NA 141 141  K 4.9 4.4  CL 106 100  CO2 22 25  GLUCOSE 167* 161*  BUN 90* 106*  CREATININE 7.02* 7.93*  CALCIUM 7.8* 7.8*  MG 2.2  --   AST  --  25  ALT  --  31  ALKPHOS  --  43  BILITOT  --  1.0   ------------------------------------------------------------------------------------------------------------------  Cardiac Enzymes No results for input(s): TROPONINI in the last 168 hours. ------------------------------------------------------------------------------------------------------------------  RADIOLOGY:  Ct Abdomen Pelvis Wo Contrast  Result Date: 09/11/2018 CLINICAL DATA:  Postop appendectomy.  Abdominal distention. EXAM: CT ABDOMEN AND PELVIS WITHOUT CONTRAST TECHNIQUE: Multidetector CT imaging of the abdomen and pelvis was performed following the standard protocol without IV contrast. COMPARISON:  09/08/2018 FINDINGS: Lower chest: Cardiomegaly. Bibasilar atelectasis. Coronary artery calcifications, status post CABG. Hepatobiliary: Layering gallstones within the bladder. No focal hepatic abnormality. Pancreas: No focal abnormality or ductal dilatation. Spleen: No focal abnormality.  Normal size. Adrenals/Urinary Tract: Foley catheter present in the bladder which is decompressed. No hydronephrosis. Adrenal glands unremarkable. Stomach/Bowel: Dilated small bowel loops. Distal small bowel loops and colon are  decompressed. Mild distention of the stomach with contrast. Vascular/Lymphatic: Aortic atherosclerosis. No enlarged abdominal or pelvic lymph nodes. Reproductive: Mild prostate prominence. Other: Moderate complex free fluid within the abdomen and pelvis, possibly hemoperitoneum. No free air. Musculoskeletal: No acute bony abnormality. Pars defects and degenerative changes at L5 S1. IMPRESSION: Postop from appendectomy. There are mildly dilated small bowel loops. Distal small bowel loops are decompressed as is the colon. This could reflect ileus or small-bowel obstruction. Moderate complex fluid in the abdomen and pelvis, most notable in the pelvis. This may reflect blood/hemoperitoneum. Aortic atherosclerosis. Bibasilar atelectasis. Prostate enlargement. Electronically Signed   By: Rolm Baptise M.D.   On: 09/11/2018 10:18    EKG:  No orders found for this or any previous visit.  IMPRESSION AND PLAN:   63 year old male presenting with  right lower quadrant pain, nausea, fever of 102.5 and chills.CT abdomen and pelvis showed evidence of dilated appendix with appendiceal inflammation consistent with acute  appendicitis.  #1. Acute renal failure on chronic kidney disease stage III with proteinuria: baseline creatinine of 2.23, GFR of 35 on 08/10/2018.  Likely secondary to ATN from IV contrast nephropathy, acute appendicitis -Nephrology following -Holding ACE inhibitors, diuretics and nephrotoxins -IVFs hydration -Per nephrology may need dialysis during admission if no improvement in renal function  2. Hypertension: BP stable -Continue hydralazine, carvedilol and isosorbide mononitrate  3. Insulin-dependent diabetes mellitus with complications: Not well controlled, was on glipizide and Levemir at home -Check hemoglobin A1c -Started on sliding scale and long-acting -Diabetes coordinator to see for blood sugar management  4.  Anemia of chronic disease: Hemoglobin 10.3 status post PRBC transfusion on  4/10 improved at 8.3 -Follow CBC  5.  Acute appendicitis: s/p laparoscopic appendectomy for acute appendicitis. Post OP #4 -Management per surgery and GI  6. Congestive Heart Failure: Mild cardiomyopathy. Last Echo 2013 -On carvedilol and isosorbide mononitrate -Check Daily weights, close monitoring of I&O -Holding diuretics -Recommend Low salt diet  -Will need outpatient follow up with cardiology  6. GERD: GI prophylaxis with Protonix  7.  DVT prophylaxis - Consider Lovenox subcutaneous if ok with primary team   All the records are reviewed and case discussed with Consulting provider. Management plans discussed with the patient, family and they are in agreement.  CODE STATUS: FULL  TOTAL TIME TAKING CARE OF THIS PATIENT: 42minutes.    Karen Kays NP on 09/12/2018 at 11:43 AM  Between 7am to 6pm - Pager - [phone]  After 6pm go to [email or web address] - password [email or web address] [facility]  [facility]  Office  [phone]  CC: Primary care Physician: Cyndi Bender, PA-C   Note: This dictation was prepared with Dragon dictation along with smaller phrase technology. Any transcriptional errors that result from this process are unintentional.

## 2018-09-13 LAB — PHOSPHORUS: Phosphorus: 6.9 mg/dL — ABNORMAL HIGH (ref 2.5–4.6)

## 2018-09-13 LAB — BASIC METABOLIC PANEL
Anion gap: 15 (ref 5–15)
BUN: 109 mg/dL — ABNORMAL HIGH (ref 8–23)
CO2: 27 mmol/L (ref 22–32)
Calcium: 7.5 mg/dL — ABNORMAL LOW (ref 8.9–10.3)
Chloride: 99 mmol/L (ref 98–111)
Creatinine, Ser: 7.45 mg/dL — ABNORMAL HIGH (ref 0.61–1.24)
GFR calc Af Amer: 8 mL/min — ABNORMAL LOW (ref >60)
GFR calc non Af Amer: 7 mL/min — ABNORMAL LOW (ref >60)
Glucose, Bld: 178 mg/dL — ABNORMAL HIGH (ref 70–99)
Potassium: 3.8 mmol/L (ref 3.5–5.1)
Sodium: 141 mmol/L (ref 135–145)

## 2018-09-13 LAB — GLUCOSE, CAPILLARY
Glucose-Capillary: 167 mg/dL — ABNORMAL HIGH (ref 70–99)
Glucose-Capillary: 165 mg/dL — ABNORMAL HIGH (ref 70–99)
Glucose-Capillary: 108 mg/dL — ABNORMAL HIGH (ref 70–99)
Glucose-Capillary: 157 mg/dL — ABNORMAL HIGH (ref 70–99)

## 2018-09-13 MED ORDER — PANTOPRAZOLE SODIUM 40 MG PO TBEC
40.0000 mg | DELAYED_RELEASE_TABLET | Freq: Every day | ORAL | Status: DC
  Administered 2018-09-13 – 2018-09-20 (×8): 40 mg via ORAL
  Filled 2018-09-13 (×8): qty 1

## 2018-09-13 NOTE — Progress Notes (Signed)
[place] [facility] [place] NAME: Phillip [name]    MR#:  [id number]  DATE OF BIRTH:  04/28/1956  SUBJECTIVE:  CHIEF COMPLAINT:   Chief Complaint  Patient presents with  . Abdominal Pain   Patient still weak but reports is feeling much better today.  He was able to get out of the bed and sit at the side of the bed yesterday.  Denies any concerns.  No significant events reported overnight. REVIEW OF SYSTEMS:  Review of Systems  Constitutional: Negative for chills and fever.  HENT: Negative for sore throat.   Respiratory: Negative for cough, shortness of breath and wheezing.   Cardiovascular: Negative for chest pain and leg swelling.  Gastrointestinal: Negative for abdominal pain, diarrhea, nausea and vomiting.  Genitourinary: Negative for dysuria.  Musculoskeletal: Negative for back pain and myalgias.  Neurological: Negative for dizziness, sensory change, speech change, focal weakness and headaches.   DRUG ALLERGIES:  No Known Allergies  VITALS:  Blood pressure (!) 157/78, pulse 86, temperature 98.7 F (37.1 C), temperature source Oral, resp. rate 18, height 6\' 1"  (1.854 m), weight 110.2 kg, SpO2 90 %.  PHYSICAL EXAMINATION:   GENERAL:  63 y.o.-year-old patient lying in the bed with no acute distress.  EYES: Pupils equal, round, reactive to light and accommodation. No scleral icterus. Extraocular muscles intact.  HEENT: Head atraumatic, normocephalic. Oropharynx and nasopharynx clear.  NECK:  Supple, no jugular venous distention. No thyroid enlargement, no tenderness.  LUNGS: Normal breath sounds bilaterally, no wheezing, rales,rhonchi or crepitation. No use of accessory muscles of respiration.  CARDIOVASCULAR: S1, S2 normal. No murmurs, rubs, or gallops.  ABDOMEN: Soft, nontender, nondistended. Bowel sounds present. No organomegaly or mass.  EXTREMITIES: No pedal edema, cyanosis, or clubbing.  NEUROLOGIC: Cranial nerves II through XII  are intact. Muscle strength 5/5 in all extremities. Sensation intact. Gait not checked.  PSYCHIATRIC: The patient is alert and oriented x 3.  SKIN: No obvious rash, lesion, or ulcer.   LABORATORY PANEL:   CBC Recent Labs  Lab 09/12/18 0526  WBC 16.1*  HGB 8.3*  HCT 26.2*  PLT 170   ------------------------------------------------------------------------------------------------------------------  Chemistries  Recent Labs  Lab 09/11/18 0300 09/12/18 0526 09/13/18 0522  NA 141 141 141  K 4.9 4.4 3.8  CL 106 100 99  CO2 22 25 27   GLUCOSE 167* 161* 178*  BUN 90* 106* 109*  CREATININE 7.02* 7.93* 7.45*  CALCIUM 7.8* 7.8* 7.5*  MG 2.2  --   --   AST  --  25  --   ALT  --  31  --   ALKPHOS  --  43  --   BILITOT  --  1.0  --    ------------------------------------------------------------------------------------------------------------------  Cardiac Enzymes No results for input(s): TROPONINI in the last 168 hours. ------------------------------------------------------------------------------------------------------------------  RADIOLOGY:  No results found.  EKG:  No orders found for this or any previous visit.  ASSESSMENT AND PLAN:   63 year old male presenting with  right lower quadrant pain, nausea, fever of 102.5 and chills.CT abdomen and pelvis showed evidence of dilated appendix with appendiceal inflammation consistent with acute appendicitis.  #1. Acute renal failure on chronic kidney disease stage III with proteinuria: baseline creatinine of 2.23, GFR of 35 on 08/10/2018.  Likely secondary to ATN from IV contrast nephropathy, acute appendicitis -Nephrology following -Holding ACE inhibitors, diuretics and nephrotoxins -IVFs hydration -Per nephrology no indication for dialysis at this time as renal function is starting to plateau.  2. Hypertension:  BP 157/78 this morning.  Allowing permissive hypertension per nephrology -Continue hydralazine, carvedilol and  isosorbide mononitrate  3. Insulin-dependent diabetes mellitus with complications: Not well controlled, was on glipizide and Levemir at home -hemoglobin A1c pending -on sliding scale and long-acting -Diabetes coordinator to see for blood sugar management  4.  Anemia of chronic disease: Hemoglobin 10.3 status post PRBC transfusion on 4/10 improved at 8.3  5.  Acute appendicitis: s/p laparoscopic appendectomyfor acute appendicitis. Post OP #5 -Management per surgery and GI  6. Congestive Heart Failure: Mild cardiomyopathy. Last Echo 2013 -On carvedilol and isosorbide mononitrate -Check Daily weights, close monitoring of I&O -Holding diuretics -Recommend Low salt diet  -Will need outpatient follow up with cardiology  6. GERD: On Protonix for GI prophylaxis with   7.  DVT prophylaxis - Holding anticoagulation and antiplatelets given elevated creatinine and history of bleed?.  He is ambulatory now/SCDs.   All the records are reviewed and case discussed with Care Management/Social Workerr. Management plans discussed with the patient, family and they are in agreement.  CODE STATUS: FULL  TOTAL TIME TAKING CARE OF THIS PATIENT: 36 minutes.   POSSIBLE D/C IN 1-2 DAYS, DEPENDING ON CLINICAL CONDITION.  This patient was staffed with Dr. Atha Starks, Manuella Ghazi who personally evaluated patient, reviewed documentation and agreed with assessment and plan of care as above.  Rufina Falco, DNP, FNP-BC Hospitalist Nurse Practitioner   09/13/2018 at 11:42 AM  Between 7am to 6pm - Pager - [phone]  After 6pm go to [email or web address] - password [email or web address] [place] [facility]  Office  [phone]  CC: Primary care physician; Cyndi Bender, PA-C

## 2018-09-13 NOTE — Progress Notes (Signed)
PHARMACIST - PHYSICIAN COMMUNICATION  DR:   Dahlia Byes  CONCERNING: IV to Oral Route Change Policy  RECOMMENDATION: This patient is receiving pantoprazole by the intravenous route.  Based on criteria approved by the Pharmacy and Therapeutics Committee, the intravenous medication(s) is/are being converted to the equivalent oral dose form(s).   DESCRIPTION: These criteria include:  The patient is eating (either orally or via tube) and/or has been taking other orally administered medications for a least 24 hours  The patient has no evidence of active gastrointestinal bleeding or impaired GI absorption (gastrectomy, short bowel, patient on TNA or NPO).  If you have questions about this conversion, please contact the Pharmacy Department  []   [phone] )  Phillip [name] [x]   [phone] )  [facility] []   [phone] )  [facility] []   [phone] )  [facility] []   [phone] )  [place], [facility] 09/13/2018 10:16 AM

## 2018-09-13 NOTE — Progress Notes (Signed)
CC: s/p lap appy Subjective: Doing much better, ambulated, toletaing po Making good urine now  Objective: Vital signs in last 24 hours: Temp:  [98.4 F (36.9 C)-99.6 F (37.6 C)] 98.4 F (36.9 C) (04/11 2110) Pulse Rate:  [85-96] 85 (04/11 2110) Resp:  [16-20] 20 (04/11 2110) BP: (138-153)/(72-81) 138/74 (04/11 2110) SpO2:  [93 %-95 %] 94 % (04/11 2110) Last BM Date: 09/12/18  Intake/Output from previous day: 04/11 0701 - 04/12 0700 In: 2920 [P.O.:1240; I.V.:1530; IV Piggyback:150] Out: 850 [Urine:850] Intake/Output this shift: Total I/O In: 740 [P.O.:60; I.V.:630; IV Piggyback:50] Out: 400 [Urine:400]  Physical exam: NAD alert Abd: soft, NT, incisions c/d/i, no infection or peritonitis Ext: well perfused and warm  Lab Results: CBC  Recent Labs    09/11/18 0300 09/11/18 2326 09/12/18 0526  WBC 21.4*  --  16.1*  HGB 7.3* 8.2* 8.3*  HCT 23.3* 25.3* 26.2*  PLT 166  --  170   BMET Recent Labs    09/11/18 0300 09/12/18 0526  NA 141 141  K 4.9 4.4  CL 106 100  CO2 22 25  GLUCOSE 167* 161*  BUN 90* 106*  CREATININE 7.02* 7.93*  CALCIUM 7.8* 7.8*   PT/INR No results for input(s): LABPROT, INR in the last 72 hours. ABG No results for input(s): PHART, HCO3 in the last 72 hours.  Invalid input(s): PCO2, PO2  Studies/Results: Ct Abdomen Pelvis Wo Contrast  Result Date: 09/11/2018 CLINICAL DATA:  Postop appendectomy.  Abdominal distention. EXAM: CT ABDOMEN AND PELVIS WITHOUT CONTRAST TECHNIQUE: Multidetector CT imaging of the abdomen and pelvis was performed following the standard protocol without IV contrast. COMPARISON:  09/08/2018 FINDINGS: Lower chest: Cardiomegaly. Bibasilar atelectasis. Coronary artery calcifications, status post CABG. Hepatobiliary: Layering gallstones within the bladder. No focal hepatic abnormality. Pancreas: No focal abnormality or ductal dilatation. Spleen: No focal abnormality.  Normal size. Adrenals/Urinary Tract: Foley catheter  present in the bladder which is decompressed. No hydronephrosis. Adrenal glands unremarkable. Stomach/Bowel: Dilated small bowel loops. Distal small bowel loops and colon are decompressed. Mild distention of the stomach with contrast. Vascular/Lymphatic: Aortic atherosclerosis. No enlarged abdominal or pelvic lymph nodes. Reproductive: Mild prostate prominence. Other: Moderate complex free fluid within the abdomen and pelvis, possibly hemoperitoneum. No free air. Musculoskeletal: No acute bony abnormality. Pars defects and degenerative changes at L5 S1. IMPRESSION: Postop from appendectomy. There are mildly dilated small bowel loops. Distal small bowel loops are decompressed as is the colon. This could reflect ileus or small-bowel obstruction. Moderate complex fluid in the abdomen and pelvis, most notable in the pelvis. This may reflect blood/hemoperitoneum. Aortic atherosclerosis. Bibasilar atelectasis. Prostate enlargement. Electronically Signed   By: Rolm Baptise M.D.   On: 09/11/2018 10:18    Anti-infectives: Anti-infectives (From admission, onward)   Start     Dose/Rate Route Frequency Ordered Stop   09/10/18 2200  piperacillin-tazobactam (ZOSYN) IVPB 3.375 g     3.375 g 12.5 mL/hr over 240 Minutes Intravenous Every 12 hours 09/10/18 1010     09/09/18 1700  piperacillin-tazobactam (ZOSYN) IVPB 3.375 g  Status:  Discontinued     3.375 g 12.5 mL/hr over 240 Minutes Intravenous Every 8 hours 09/09/18 1309 09/10/18 1010   09/08/18 2100  piperacillin-tazobactam (ZOSYN) IVPB 3.375 g  Status:  Discontinued     3.375 g 12.5 mL/hr over 240 Minutes Intravenous Every 8 hours 09/08/18 1838 09/09/18 1309   09/08/18 1430  piperacillin-tazobactam (ZOSYN) IVPB 3.375 g     3.375 g 100 mL/hr over 30 Minutes Intravenous  Once 09/08/18 1425 09/08/18 1519      Assessment/Plan:  Much improved now making good Urine May slowly advance diet Main issue is renal once he recovers from ATN and nephrology feels it  is safe we will send him home Keep foley for now given ATN and urine retention  Caroleen Hamman, MD, FACS  09/13/2018

## 2018-09-13 NOTE — Progress Notes (Signed)
[facility]  ROUNDING NOTE   Subjective:   Patient states he is feeling better. Started on a diet.   UOP 1347mL  Bicarb at 36mL/hr  Creatinine 7.45 (7.93) (7.02) (5.15) BUN 109 (106) (90) (71)  Objective:  Vital signs in last 24 hours:  Temp:  [98.4 F (36.9 C)-98.7 F (37.1 C)] 98.7 F (37.1 C) (04/12 0437) Pulse Rate:  [85-87] 86 (04/12 0957) Resp:  [18-20] 18 (04/12 0437) BP: (138-164)/(74-82) 157/78 (04/12 0957) SpO2:  [90 %-95 %] 90 % (04/12 0957)  Weight change:  Filed Weights   09/08/18 1239  Weight: 110.2 kg    Intake/Output: I/O last 3 completed shifts: In: 4065 [P.O.:1300; I.V.:2230; Blood:330; IV DUKGURKYH:062] Out: 3762 [GBTDV:7616; Emesis/NG output:150]   Intake/Output this shift:  Total I/O In: 480 [P.O.:480] Out: -   Physical Exam: General: NAD,   Head: Normocephalic, atraumatic. Moist oral mucosal membranes  Eyes: Anicteric, PERRL  Neck: Supple, trachea midline  Lungs:  Clear to auscultation  Heart: Regular rate and rhythm  Abdomen:  Soft, nontender,   Extremities: no peripheral edema.  Neurologic: Nonfocal, moving all four extremities, alert and oriented  Skin: No lesions  Access: none    Basic Metabolic Panel: Recent Labs  Lab 09/09/18 0317 09/10/18 0439 09/11/18 0300 09/12/18 0526 09/13/18 0522  NA 145 143 141 141 141  K 4.9 5.2* 4.9 4.4 3.8  CL 112* 110 106 100 99  CO2 22 19* 22 25 27   GLUCOSE 181* 134* 167* 161* 178*  BUN 47* 71* 90* 106* 109*  CREATININE 2.80* 5.15* 7.02* 7.93* 7.45*  CALCIUM 8.1* 7.8* 7.8* 7.8* 7.5*  MG  --   --  2.2  --   --   PHOS  --   --  7.0* 7.4* 6.9*    Liver Function Tests: Recent Labs  Lab 09/08/18 1315 09/12/18 0526  AST 18 25  ALT 12 31  ALKPHOS 76 43  BILITOT 1.0 1.0  PROT 7.4 7.0  ALBUMIN 4.0 3.1*   Recent Labs  Lab 09/08/18 1315  LIPASE 28   No results for input(s): AMMONIA in the last 168 hours.  CBC: Recent Labs  Lab 09/08/18 1315 09/09/18 0317  09/10/18 0439 09/11/18 0300 09/11/18 2326 09/12/18 0526  WBC 13.9* 24.0* 22.9* 21.4*  --  16.1*  HGB 11.3* 9.8* 8.2* 7.3* 8.2* 8.3*  HCT 34.4* 31.1* 26.6* 23.3* 25.3* 26.2*  MCV 84.3 88.4 89.9 89.3  --  87.6  PLT 157 201 164 166  --  170    Cardiac Enzymes: No results for input(s): CKTOTAL, CKMB, CKMBINDEX, TROPONINI in the last 168 hours.  BNP: Invalid input(s): POCBNP  CBG: Recent Labs  Lab 09/12/18 1150 09/12/18 1637 09/12/18 2110 09/13/18 0739 09/13/18 1144  GLUCAP 182* 162* 140* 167* 165*    Microbiology: Results for orders placed or performed during the hospital encounter of 03/13/18  Aerobic Culture (superficial specimen)     Status: None   Collection Time: 03/13/18 10:06 AM  Result Value Ref Range Status   Specimen Description   Final    WOUND Performed at [facility], [address], [place], [place] [address]    Special Requests   Final    NONE Performed at [facility], [address] [place], [place] [address]    Gram Stain   Final    RARE WBC PRESENT,BOTH PMN AND MONONUCLEAR RARE GRAM POSITIVE COCCI Performed at [facility], [place] [address], [place], [place] [address]  Culture   Final    FEW ENTEROBACTER CLOACAE FEW VIRIDANS STREPTOCOCCUS FEW SERRATIA MARCESCENS    Report Status 03/17/2018 FINAL  Final   Organism ID, Bacteria ENTEROBACTER CLOACAE  Final   Organism ID, Bacteria SERRATIA MARCESCENS  Final      Susceptibility   Enterobacter cloacae - MIC*    CEFAZOLIN >=64 RESISTANT Resistant     CEFEPIME <=1 SENSITIVE Sensitive     CEFTAZIDIME <=1 SENSITIVE Sensitive     CEFTRIAXONE <=1 SENSITIVE Sensitive     CIPROFLOXACIN <=0.25 SENSITIVE Sensitive     GENTAMICIN <=1 SENSITIVE Sensitive     IMIPENEM <=0.25 SENSITIVE Sensitive     TRIMETH/SULFA <=20 SENSITIVE Sensitive     PIP/TAZO 16 SENSITIVE Sensitive     * FEW ENTEROBACTER CLOACAE   Serratia marcescens - MIC*    CEFAZOLIN >=64 RESISTANT Resistant      CEFEPIME <=1 SENSITIVE Sensitive     CEFTAZIDIME <=1 SENSITIVE Sensitive     CEFTRIAXONE <=1 SENSITIVE Sensitive     CIPROFLOXACIN <=0.25 SENSITIVE Sensitive     GENTAMICIN <=1 SENSITIVE Sensitive     TRIMETH/SULFA <=20 SENSITIVE Sensitive     * FEW SERRATIA MARCESCENS    Coagulation Studies: No results for input(s): LABPROT, INR in the last 72 hours.  Urinalysis: Recent Labs    09/12/18 0513  COLORURINE YELLOW*  LABSPEC 1.024  PHURINE 5.0  GLUCOSEU NEGATIVE  HGBUR MODERATE*  BILIRUBINUR NEGATIVE  KETONESUR NEGATIVE  PROTEINUR 30*  NITRITE NEGATIVE  LEUKOCYTESUR NEGATIVE      Imaging: No results found.   Medications:   . sodium chloride 250 mL (09/12/18 2243)  . piperacillin-tazobactam (ZOSYN)  IV 3.375 g (09/13/18 1005)  .  sodium bicarbonate  infusion 1000 mL 75 mL/hr at 09/12/18 1723   . acetaminophen  1,000 mg Oral Q6H  . carvedilol  6.25 mg Oral BID WC  . insulin aspart  0-15 Units Subcutaneous TID WC  . insulin aspart  0-5 Units Subcutaneous QHS  . insulin aspart  4 Units Subcutaneous TID WC  . insulin glargine  10 Units Subcutaneous BID  . isosorbide mononitrate  60 mg Oral Daily  . pantoprazole  40 mg Oral QHS  . polyethylene glycol  17 g Oral Daily   sodium chloride, dextrose, hydrALAZINE, HYDROmorphone (DILAUDID) injection, nitroGLYCERIN, ondansetron **OR** ondansetron (ZOFRAN) IV, oxyCODONE, prochlorperazine **OR** prochlorperazine, traMADol  Assessment/ Plan:  Phillip [name] is a 63 y.o. black male with diabetes mellitus type II, diabetic neuropathy, hypertension, coronary artery disease status post CABG, peripheral vascular disease, status post left toe amputation, congestive heart failure, asthma, who was admitted to [facility] on 09/08/2018 for Acute appendicitis   1. Acute renal failure on chronic kidney disease stage III with proteinuria: baseline creatinine of 2.23, GFR of 35 on 08/10/2018.  Chronic Kidney Disease secondary to diabetic  nephropathy Acute renal failure secondary to ATN from IV contrast nephropathy (4/7) and possibly from acute illness, acute appendicitis.  No indication for dialysis. Nonoliguric.  - Holding lisinopril, furosemide, spironolactone, potassium chloride, and metolazone.  - Continue IVF: sodium bicarbonate infusion.  - renally dose pip/tazo  2. Hypertension:  157/78 - let higher blood pressure ride for now.  - hydralazine, carvedilol, isosorbide mononitrate  3. Diabetes mellitus type II with chronic kidney disease: insulin dependent. not well controlled. Hemoglobin A1c of 9.1% on 01/14/18  4. Anemia with renal failure: status post PRBC transfusion on 4/10. Hemoglobin 8.3   LOS: 3 Wing Schoch 4/12/202011:51 AM

## 2018-09-14 ENCOUNTER — Inpatient Hospital Stay: Payer: PPO

## 2018-09-14 LAB — URINALYSIS, ROUTINE W REFLEX MICROSCOPIC
Bilirubin Urine: NEGATIVE
Glucose, UA: NEGATIVE mg/dL
Ketones, ur: NEGATIVE mg/dL
Leukocytes,Ua: NEGATIVE
Nitrite: NEGATIVE
Protein, ur: 100 mg/dL — AB
Specific Gravity, Urine: 1.018 (ref 1.005–1.030)
pH: 5 (ref 5.0–8.0)

## 2018-09-14 LAB — BASIC METABOLIC PANEL
Anion gap: 15 (ref 5–15)
Anion gap: 15 (ref 5–15)
BUN: 96 mg/dL — ABNORMAL HIGH (ref 8–23)
BUN: 85 mg/dL — ABNORMAL HIGH (ref 8–23)
CO2: 31 mmol/L (ref 22–32)
CO2: 31 mmol/L (ref 22–32)
Calcium: 7.7 mg/dL — ABNORMAL LOW (ref 8.9–10.3)
Calcium: 7.7 mg/dL — ABNORMAL LOW (ref 8.9–10.3)
Chloride: 97 mmol/L — ABNORMAL LOW (ref 98–111)
Chloride: 95 mmol/L — ABNORMAL LOW (ref 98–111)
Creatinine, Ser: 6.08 mg/dL — ABNORMAL HIGH (ref 0.61–1.24)
Creatinine, Ser: 5.32 mg/dL — ABNORMAL HIGH (ref 0.61–1.24)
GFR calc Af Amer: 10 mL/min — ABNORMAL LOW (ref >60)
GFR calc Af Amer: 12 mL/min — ABNORMAL LOW (ref >60)
GFR calc non Af Amer: 9 mL/min — ABNORMAL LOW (ref >60)
GFR calc non Af Amer: 11 mL/min — ABNORMAL LOW (ref >60)
Glucose, Bld: 131 mg/dL — ABNORMAL HIGH (ref 70–99)
Glucose, Bld: 126 mg/dL — ABNORMAL HIGH (ref 70–99)
Potassium: 3.4 mmol/L — ABNORMAL LOW (ref 3.5–5.1)
Potassium: 3.4 mmol/L — ABNORMAL LOW (ref 3.5–5.1)
Sodium: 143 mmol/L (ref 135–145)
Sodium: 141 mmol/L (ref 135–145)

## 2018-09-14 LAB — TYPE AND SCREEN
ABO/RH(D): O POS
Antibody Screen: POSITIVE
DAT, IgG: POSITIVE
DAT, complement: NEGATIVE
Unit division: 0
Unit division: 0

## 2018-09-14 LAB — CBC
HCT: 26.8 % — ABNORMAL LOW (ref 39.0–52.0)
HCT: 26 % — ABNORMAL LOW (ref 39.0–52.0)
Hemoglobin: 8.5 g/dL — ABNORMAL LOW (ref 13.0–17.0)
Hemoglobin: 8.3 g/dL — ABNORMAL LOW (ref 13.0–17.0)
MCH: 27.7 pg (ref 26.0–34.0)
MCH: 28.2 pg (ref 26.0–34.0)
MCHC: 31.7 g/dL (ref 30.0–36.0)
MCHC: 31.9 g/dL (ref 30.0–36.0)
MCV: 87.3 fL (ref 80.0–100.0)
MCV: 88.4 fL (ref 80.0–100.0)
Platelets: 184 10*3/uL (ref 150–400)
Platelets: 218 10*3/uL (ref 150–400)
RBC: 3.07 MIL/uL — ABNORMAL LOW (ref 4.22–5.81)
RBC: 2.94 MIL/uL — ABNORMAL LOW (ref 4.22–5.81)
RDW: 16.1 % — ABNORMAL HIGH (ref 11.5–15.5)
RDW: 16.5 % — ABNORMAL HIGH (ref 11.5–15.5)
WBC: 12.6 10*3/uL — ABNORMAL HIGH (ref 4.0–10.5)
WBC: 15 10*3/uL — ABNORMAL HIGH (ref 4.0–10.5)
nRBC: 0 % (ref 0.0–0.2)
nRBC: 0 % (ref 0.0–0.2)

## 2018-09-14 LAB — BPAM RBC
Blood Product Expiration Date: 202005112359
Blood Product Expiration Date: 202005142359
ISSUE DATE / TIME: 202004101727
Unit Type and Rh: 5100
Unit Type and Rh: 5100

## 2018-09-14 LAB — PHOSPHORUS: Phosphorus: 5.6 mg/dL — ABNORMAL HIGH (ref 2.5–4.6)

## 2018-09-14 LAB — GLUCOSE, CAPILLARY
Glucose-Capillary: 140 mg/dL — ABNORMAL HIGH (ref 70–99)
Glucose-Capillary: 155 mg/dL — ABNORMAL HIGH (ref 70–99)
Glucose-Capillary: 155 mg/dL — ABNORMAL HIGH (ref 70–99)
Glucose-Capillary: 121 mg/dL — ABNORMAL HIGH (ref 70–99)

## 2018-09-14 LAB — UREA NITROGEN, URINE: Urea Nitrogen, Ur: 378 mg/dL

## 2018-09-14 LAB — HEMOGLOBIN A1C
Hgb A1c MFr Bld: 7.1 % — ABNORMAL HIGH (ref 4.8–5.6)
Mean Plasma Glucose: 157 mg/dL

## 2018-09-14 IMAGING — DX PORTABLE CHEST - 1 VIEW
1 series · 1 of 1 positions shown · non-contrast
Comparison: [DATE]

CLINICAL DATA: Fevers

EXAM:
PORTABLE CHEST 1 VIEW

[chest ap]
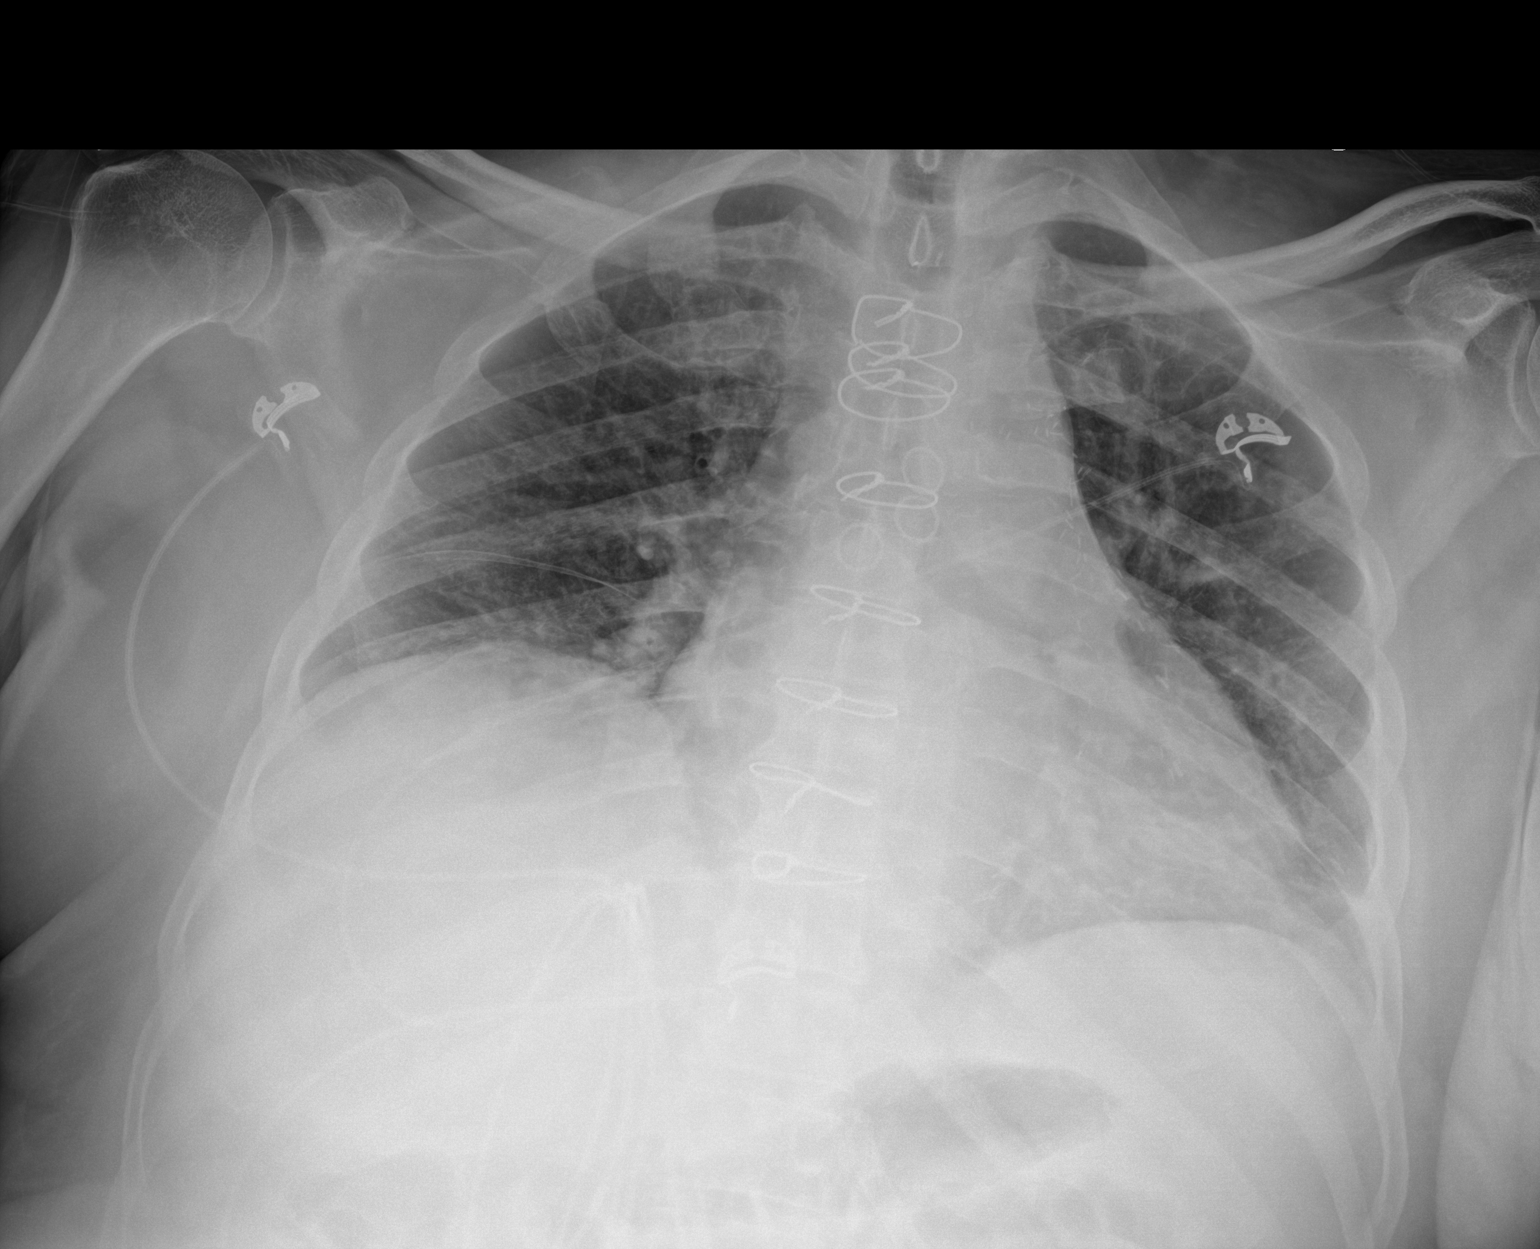

[1 of 1 positions shown; findings below may reference images not displayed]

FINDINGS: Cardiac shadow is prominent but stable. Postsurgical changes are
again noted. The overall inspiratory effort is poor. Mild increased
vascular congestion noted without significant edema. Bibasilar
atelectatic changes are noted. No bony abnormality is noted.
IMPRESSION: Mild bibasilar atelectasis.

Mild vascular congestion.

## 2018-09-14 MED ORDER — ISOSORBIDE MONONITRATE ER 60 MG PO TB24
120.0000 mg | ORAL_TABLET | Freq: Every day | ORAL | Status: DC
  Administered 2018-09-14 – 2018-09-21 (×8): 120 mg via ORAL
  Filled 2018-09-14 (×9): qty 2

## 2018-09-14 NOTE — Care Management Important Message (Signed)
Important Message  Patient Details  Name: Phillip [name] MRN: [id number] Date of Birth: 1955/12/08   Medicare Important Message Given:  Yes    Dannette Barbara 09/14/2018, 11:09 AM

## 2018-09-14 NOTE — Progress Notes (Addendum)
[place] Hospital Day(s): 4.   Post op day(s): 6 Days Post-Op.   Interval History: Patient seen and examined, no acute events or new complaints overnight. Patient reports that he is feeling better this morning. He still has some abdominal soreness but this is improved. No complaints of fever, chills, nausea, or emesis. He has tolerated a full liquid diet. Endorses flatus and had multiple BMs yesterday. His renal function has improved today and he continues to have good U/O (~1.8L in last 24 hours).   Review of Systems:  Constitutional: denies fever, chills  Gastrointestinal: + abdominal pain (improved), denied N/V, or diarrhea/and bowel function as per interval history Integumentary: denies any other rashes or skin discolorations except laparoscopic incisions   Vital signs in last 24 hours: [min-max] current  Temp:  [98.8 F (37.1 C)-99.3 F (37.4 C)] 98.8 F (37.1 C) (04/13 0604) Pulse Rate:  [86-90] 87 (04/13 0604) Resp:  [16-20] 16 (04/13 0604) BP: (157-187)/(70-105) 162/70 (04/13 0604) SpO2:  [90 %-94 %] 90 % (04/13 0604)     Height: 6\' 1"  (185.4 cm) Weight: 110.2 kg BMI (Calculated): 32.07   Intake/Output this shift:  No intake/output data recorded.     Physical Exam:  Constitutional: alert, cooperative and no distress  Respiratory: breathing non-labored at rest  Cardiovascular: regular rate and sinus rhythm  Gastrointestinal: soft, mild soreness, and non-distended. No rebound/guarding Integumentary: Laparoscopic incisions are CDI without erythema or drainage, there is some surrounding ecchymosis   Labs:  CBC Latest Ref Rng & Units 09/14/2018 09/12/2018 09/11/2018  WBC 4.0 - 10.5 K/uL 12.6(H) 16.1(H) -  Hemoglobin 13.0 - 17.0 g/dL 8.5(L) 8.3(L) 8.2(L)  Hematocrit 39.0 - 52.0 % 26.8(L) 26.2(L) 25.3(L)  Platelets 150 - 400 K/uL 184 170 -   CMP Latest Ref Rng & Units 09/14/2018 09/13/2018 09/12/2018  Glucose 70 - 99 mg/dL 131(H)  178(H) 161(H)  BUN 8 - 23 mg/dL 96(H) 109(H) 106(H)  Creatinine 0.61 - 1.24 mg/dL 6.08(H) 7.45(H) 7.93(H)  Sodium 135 - 145 mmol/L 143 141 141  Potassium 3.5 - 5.1 mmol/L 3.4(L) 3.8 4.4  Chloride 98 - 111 mmol/L 97(L) 99 100  CO2 22 - 32 mmol/L 31 27 25   Calcium 8.9 - 10.3 mg/dL 7.7(L) 7.5(L) 7.8(L)  Total Protein 6.5 - 8.1 g/dL - - 7.0  Total Bilirubin 0.3 - 1.2 mg/dL - - 1.0  Alkaline Phos 38 - 126 U/L - - 43  AST 15 - 41 U/L - - 25  ALT 0 - 44 U/L - - 31    Imaging studies: No new pertinent imaging studies   Assessment/Plan: (ICD-10's: K35.80) 63 y.o. male with improving leukocytosis and improved renal function now with good urine output 6 Days Post-Op s/p laparoscopic appendectomy for acute appendicitis, which has been complicated by acute renal failure.   - Advance to renal diet  - Continue IV ABx (Zosyn); renal dosing  - Continue IVF (Sodium Bicarb @ 75 ml/hr)  - pain control prn (minimize narcotics); antiemetics as needed  - Monitor abdominal exam; on-going bowel function  - trend leukocytosis: morning CBC  - Trend renal function; morning BMP   - Appreciate nephrology involvement; no indication for HD at this time  - Appreciate medicine's help with comorbidities  - DVT prophylaxis   All of the above findings and recommendations were discussed with the patient, and the medical team, and all of patient's questions were answered to his expressed satisfaction.  -- Edison Simon, PA-C  [facility]  09/14/2018, 7:49 AM [phone] M-F: 7am - 4pm

## 2018-09-14 NOTE — Progress Notes (Signed)
Rechecked BP 152/67, HR 73, T 101.3. Will continue to monitor.   Fuller Mandril, RN

## 2018-09-14 NOTE — Progress Notes (Signed)
Patient's BP 181/79, HR 97, T 100.8. Patient c/o of nausea, no vomiting. Gave PRN hydralazine, Zofran and scheduled tylenol. Will recheck VS and continue to monitor patient.   Fuller Mandril, RN

## 2018-09-14 NOTE — Progress Notes (Signed)
[facility]  ROUNDING NOTE   Subjective:  Renal parameters do appear to be improving. Urine output was 1.8 L over the preceding 24 hours.   Objective:  Vital signs in last 24 hours:  Temp:  [98.8 F (37.1 C)-100.8 F (38.2 C)] 100.8 F (38.2 C) (04/13 1136) Pulse Rate:  [87-97] 97 (04/13 1136) Resp:  [16-20] 20 (04/13 1136) BP: (162-187)/(70-105) 181/79 (04/13 1136) SpO2:  [90 %-94 %] 94 % (04/13 1136)  Weight change:  Filed Weights   09/08/18 1239  Weight: 110.2 kg    Intake/Output: I/O last 3 completed shifts: In: 2874.5 [P.O.:1020; I.V.:1709.5; IV Piggyback:145] Out: 2650 [Urine:2650]   Intake/Output this shift:  Total I/O In: 964 [P.O.:120; I.V.:844] Out: 550 [Urine:550]  Physical Exam: General: NAD  Head: Normocephalic, atraumatic. Moist oral mucosal membranes  Eyes: Anicteric  Neck: Supple, trachea midline  Lungs:  Clear to auscultation  Heart: Regular rate and rhythm  Abdomen:  Soft, nontender  Extremities: no peripheral edema.  Neurologic: Nonfocal, moving all four extremities, alert and oriented  Skin: No lesions  Access: none    Basic Metabolic Panel: Recent Labs  Lab 09/10/18 0439 09/11/18 0300 09/12/18 0526 09/13/18 0522 09/14/18 0302  NA 143 141 141 141 143  K 5.2* 4.9 4.4 3.8 3.4*  CL 110 106 100 99 97*  CO2 19* 22 25 27 31   GLUCOSE 134* 167* 161* 178* 131*  BUN 71* 90* 106* 109* 96*  CREATININE 5.15* 7.02* 7.93* 7.45* 6.08*  CALCIUM 7.8* 7.8* 7.8* 7.5* 7.7*  MG  --  2.2  --   --   --   PHOS  --  7.0* 7.4* 6.9* 5.6*    Liver Function Tests: Recent Labs  Lab 09/08/18 1315 09/12/18 0526  AST 18 25  ALT 12 31  ALKPHOS 76 43  BILITOT 1.0 1.0  PROT 7.4 7.0  ALBUMIN 4.0 3.1*   Recent Labs  Lab 09/08/18 1315  LIPASE 28   No results for input(s): AMMONIA in the last 168 hours.  CBC: Recent Labs  Lab 09/09/18 0317 09/10/18 0439 09/11/18 0300 09/11/18 2326 09/12/18 0526 09/14/18 0302  WBC 24.0* 22.9*  21.4*  --  16.1* 12.6*  HGB 9.8* 8.2* 7.3* 8.2* 8.3* 8.5*  HCT 31.1* 26.6* 23.3* 25.3* 26.2* 26.8*  MCV 88.4 89.9 89.3  --  87.6 87.3  PLT 201 164 166  --  170 184    Cardiac Enzymes: No results for input(s): CKTOTAL, CKMB, CKMBINDEX, TROPONINI in the last 168 hours.  BNP: Invalid input(s): POCBNP  CBG: Recent Labs  Lab 09/13/18 1144 09/13/18 1650 09/13/18 2120 09/14/18 0732 09/14/18 1133  GLUCAP 165* 108* 157* 140* 155*    Microbiology: Results for orders placed or performed during the hospital encounter of 03/13/18  Aerobic Culture (superficial specimen)     Status: None   Collection Time: 03/13/18 10:06 AM  Result Value Ref Range Status   Specimen Description   Final    WOUND Performed at [facility], [address], [place], [place] [address]    Special Requests   Final    NONE Performed at [facility], [address], [place], [place] [address]    Gram Stain   Final    RARE WBC PRESENT,BOTH PMN AND MONONUCLEAR RARE GRAM POSITIVE COCCI Performed at [facility], [place] [address] [place], [place] [address]    Culture   Final    FEW ENTEROBACTER CLOACAE FEW VIRIDANS STREPTOCOCCUS FEW SERRATIA MARCESCENS  Report Status 03/17/2018 FINAL  Final   Organism ID, Bacteria ENTEROBACTER CLOACAE  Final   Organism ID, Bacteria SERRATIA MARCESCENS  Final      Susceptibility   Enterobacter cloacae - MIC*    CEFAZOLIN >=64 RESISTANT Resistant     CEFEPIME <=1 SENSITIVE Sensitive     CEFTAZIDIME <=1 SENSITIVE Sensitive     CEFTRIAXONE <=1 SENSITIVE Sensitive     CIPROFLOXACIN <=0.25 SENSITIVE Sensitive     GENTAMICIN <=1 SENSITIVE Sensitive     IMIPENEM <=0.25 SENSITIVE Sensitive     TRIMETH/SULFA <=20 SENSITIVE Sensitive     PIP/TAZO 16 SENSITIVE Sensitive     * FEW ENTEROBACTER CLOACAE   Serratia marcescens - MIC*    CEFAZOLIN >=64 RESISTANT Resistant     CEFEPIME <=1 SENSITIVE Sensitive     CEFTAZIDIME <=1 SENSITIVE Sensitive      CEFTRIAXONE <=1 SENSITIVE Sensitive     CIPROFLOXACIN <=0.25 SENSITIVE Sensitive     GENTAMICIN <=1 SENSITIVE Sensitive     TRIMETH/SULFA <=20 SENSITIVE Sensitive     * FEW SERRATIA MARCESCENS    Coagulation Studies: No results for input(s): LABPROT, INR in the last 72 hours.  Urinalysis: Recent Labs    09/12/18 0513  COLORURINE YELLOW*  LABSPEC 1.024  PHURINE 5.0  GLUCOSEU NEGATIVE  HGBUR MODERATE*  BILIRUBINUR NEGATIVE  KETONESUR NEGATIVE  PROTEINUR 30*  NITRITE NEGATIVE  LEUKOCYTESUR NEGATIVE      Imaging: No results found.   Medications:   . sodium chloride 12.5 mL/hr at 09/14/18 1200  . piperacillin-tazobactam (ZOSYN)  IV 3.375 g (09/14/18 0915)  .  sodium bicarbonate  infusion 1000 mL 75 mL/hr at 09/14/18 1200   . acetaminophen  1,000 mg Oral Q6H  . carvedilol  6.25 mg Oral BID WC  . insulin aspart  0-15 Units Subcutaneous TID WC  . insulin aspart  0-5 Units Subcutaneous QHS  . insulin aspart  4 Units Subcutaneous TID WC  . insulin glargine  10 Units Subcutaneous BID  . isosorbide mononitrate  120 mg Oral Daily  . pantoprazole  40 mg Oral QHS  . polyethylene glycol  17 g Oral Daily   sodium chloride, dextrose, hydrALAZINE, HYDROmorphone (DILAUDID) injection, nitroGLYCERIN, ondansetron **OR** ondansetron (ZOFRAN) IV, oxyCODONE, prochlorperazine **OR** prochlorperazine, traMADol  Assessment/ Plan:  Phillip [name] is a 63 y.o. black male with diabetes mellitus type II, diabetic neuropathy, hypertension, coronary artery disease status post CABG, peripheral vascular disease, status post left toe amputation, congestive heart failure, asthma, who was admitted to [facility] on 09/08/2018 for Acute appendicitis   1. Acute renal failure on chronic kidney disease stage III with proteinuria: baseline creatinine of 2.23, GFR of 35 on 08/10/2018.  Chronic Kidney Disease secondary to diabetic nephropathy Acute renal failure secondary to ATN from IV contrast nephropathy (4/7)  and possibly from acute illness, acute appendicitis.  No indication for dialysis. Nonoliguric.  - Holding lisinopril, furosemide, spironolactone, potassium chloride, and metolazone.  -We will maintain the patient on IV fluid hydration with sodium bicarbonate.  No urgent indication for dialysis as urine output did increase to 1.8 L over the preceding 24 hours.  2. Hypertension: Permissive hypertension for now.  Continue current doses of carvedilol, Imdur.  3. Diabetes mellitus type II with chronic kidney disease: insulin dependent. not well controlled. Hemoglobin A1c of 9.1% on 01/14/18  4. Anemia with renal failure: Hemoglobin currently 8.5.  Hold off on Epogen for now.   LOS: 4 Danniel Tones 4/13/202012:18 PM

## 2018-09-15 ENCOUNTER — Inpatient Hospital Stay: Payer: PPO

## 2018-09-15 ENCOUNTER — Inpatient Hospital Stay
Admit: 2018-09-15 | Discharge: 2018-09-15 | Disposition: A | Discharge status: Home / Self Care | Payer: PPO | Attending: Internal Medicine | Admitting: Internal Medicine

## 2018-09-15 LAB — BASIC METABOLIC PANEL
Anion gap: 15 (ref 5–15)
BUN: 87 mg/dL — ABNORMAL HIGH (ref 8–23)
CO2: 32 mmol/L (ref 22–32)
Calcium: 7.6 mg/dL — ABNORMAL LOW (ref 8.9–10.3)
Chloride: 96 mmol/L — ABNORMAL LOW (ref 98–111)
Creatinine, Ser: 5.36 mg/dL — ABNORMAL HIGH (ref 0.61–1.24)
GFR calc Af Amer: 12 mL/min — ABNORMAL LOW (ref >60)
GFR calc non Af Amer: 10 mL/min — ABNORMAL LOW (ref >60)
Glucose, Bld: 130 mg/dL — ABNORMAL HIGH (ref 70–99)
Potassium: 3.2 mmol/L — ABNORMAL LOW (ref 3.5–5.1)
Sodium: 143 mmol/L (ref 135–145)

## 2018-09-15 LAB — CBC WITH DIFFERENTIAL/PLATELET
Abs Immature Granulocytes: 0.33 10*3/uL — ABNORMAL HIGH (ref 0.00–0.07)
Basophils Absolute: 0 10*3/uL (ref 0.0–0.1)
Basophils Relative: 0 %
Eosinophils Absolute: 0.2 10*3/uL (ref 0.0–0.5)
Eosinophils Relative: 1 %
HCT: 25.1 % — ABNORMAL LOW (ref 39.0–52.0)
Hemoglobin: 7.8 g/dL — ABNORMAL LOW (ref 13.0–17.0)
Immature Granulocytes: 2 %
Lymphocytes Relative: 11 %
Lymphs Abs: 1.5 10*3/uL (ref 0.7–4.0)
MCH: 27.7 pg (ref 26.0–34.0)
MCHC: 31.1 g/dL (ref 30.0–36.0)
MCV: 89 fL (ref 80.0–100.0)
Monocytes Absolute: 1.3 10*3/uL — ABNORMAL HIGH (ref 0.1–1.0)
Monocytes Relative: 10 %
Neutro Abs: 10.5 10*3/uL — ABNORMAL HIGH (ref 1.7–7.7)
Neutrophils Relative %: 76 %
Platelets: 183 10*3/uL (ref 150–400)
RBC: 2.82 MIL/uL — ABNORMAL LOW (ref 4.22–5.81)
RDW: 16.6 % — ABNORMAL HIGH (ref 11.5–15.5)
WBC: 13.9 10*3/uL — ABNORMAL HIGH (ref 4.0–10.5)
nRBC: 0 % (ref 0.0–0.2)

## 2018-09-15 LAB — PHOSPHORUS: Phosphorus: 5.2 mg/dL — ABNORMAL HIGH (ref 2.5–4.6)

## 2018-09-15 LAB — GLUCOSE, CAPILLARY
Glucose-Capillary: 142 mg/dL — ABNORMAL HIGH (ref 70–99)
Glucose-Capillary: 145 mg/dL — ABNORMAL HIGH (ref 70–99)
Glucose-Capillary: 166 mg/dL — ABNORMAL HIGH (ref 70–99)
Glucose-Capillary: 179 mg/dL — ABNORMAL HIGH (ref 70–99)

## 2018-09-15 LAB — PREPARE RBC (CROSSMATCH)

## 2018-09-15 LAB — MAGNESIUM: Magnesium: 2.1 mg/dL (ref 1.7–2.4)

## 2018-09-15 LAB — ECHOCARDIOGRAM COMPLETE
Height: 73 in
Weight: 3888 oz

## 2018-09-15 IMAGING — CT CT ABDOMEN AND PELVIS WITHOUT CONTRAST
2 of 4 series · 16 of 46 positions shown, 18 images · non-contrast
Comparison: [DATE]

CLINICAL DATA: Fever.  Status post appendectomy.

EXAM:
CT ABDOMEN AND PELVIS WITHOUT CONTRAST
TECHNIQUE: Multidetector CT imaging of the abdomen and pelvis was performed
following the standard protocol without IV contrast.

[Series 2: routine abd/pel wo · axial · 0.98mm/px · z∈[-469,+1]mm · 13 of 104 slices shown, 15 images]
[im 5/104  soft-tissue]
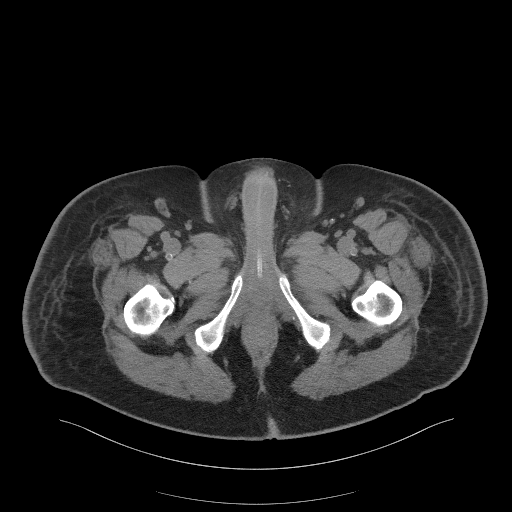
[im 5/104  bone]
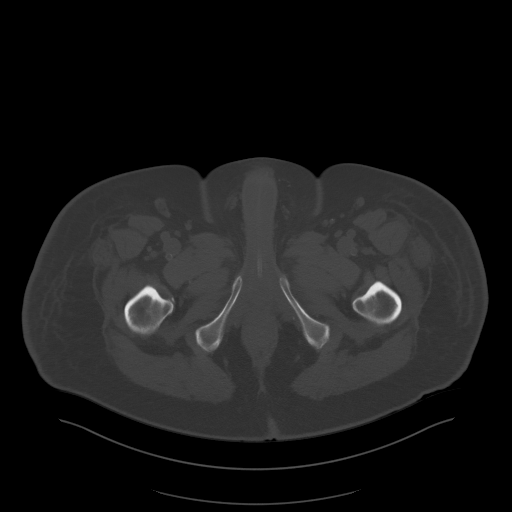
[im 13/104  soft-tissue]
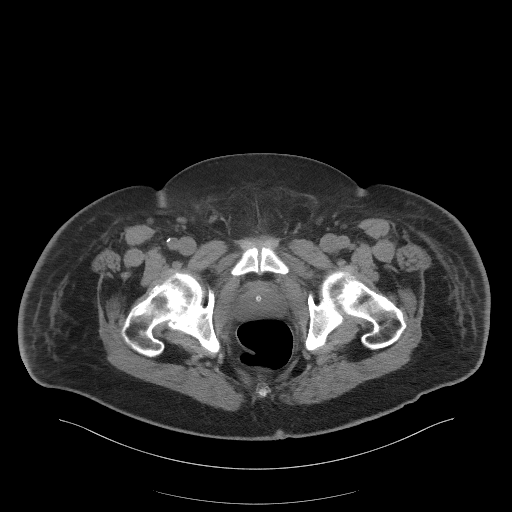
[im 22/104  soft-tissue]
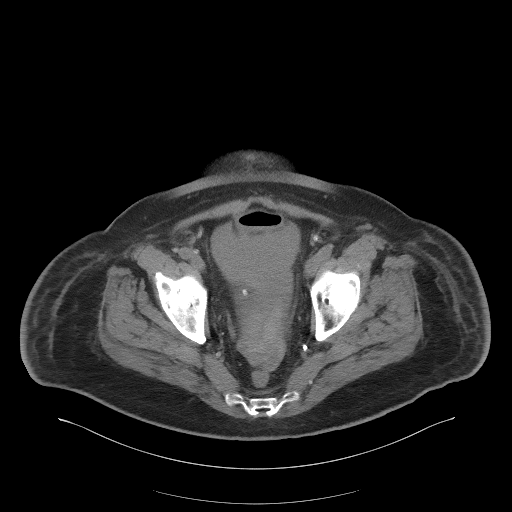
[im 31/104  soft-tissue]
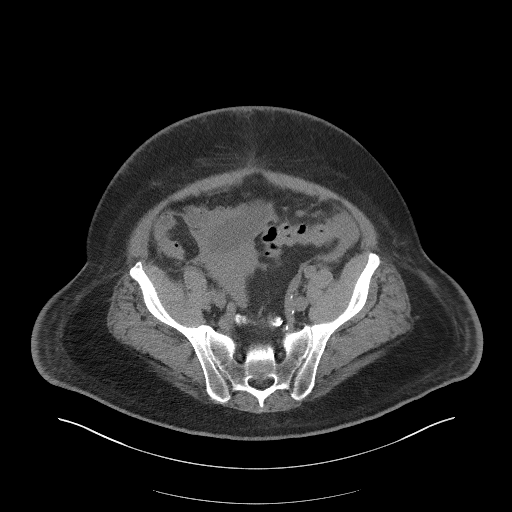
[im 35/104  soft-tissue]
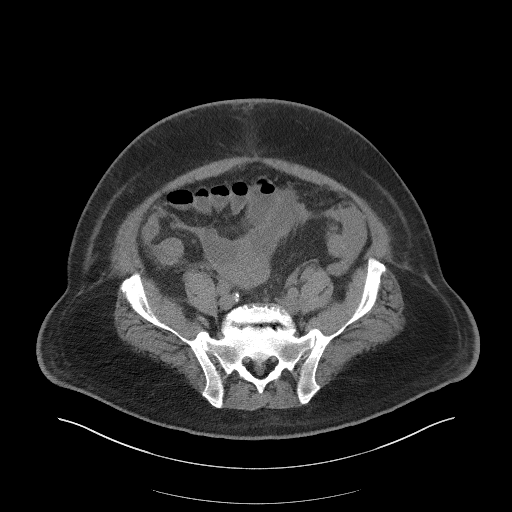
[im 43/104  soft-tissue]
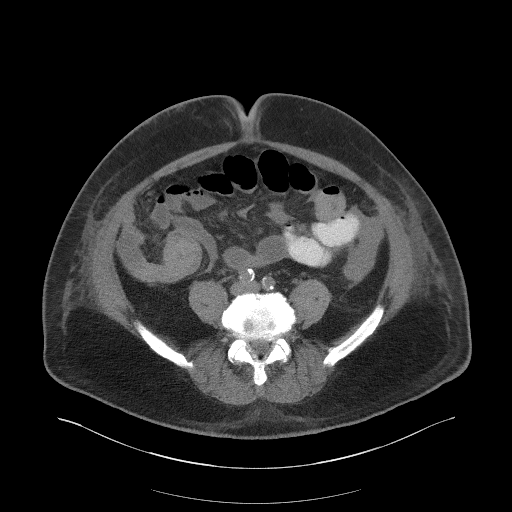
[im 52/104  soft-tissue]
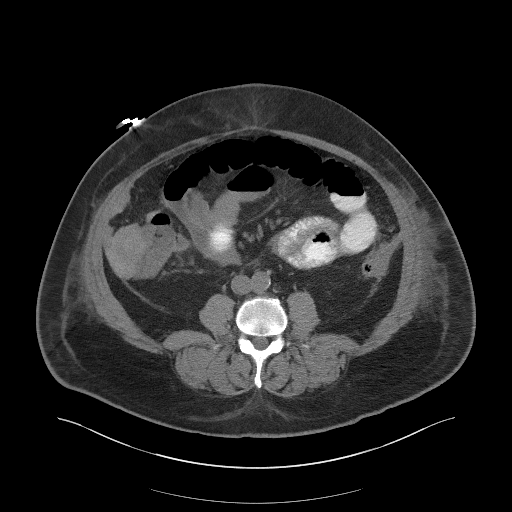
[im 61/104  soft-tissue]
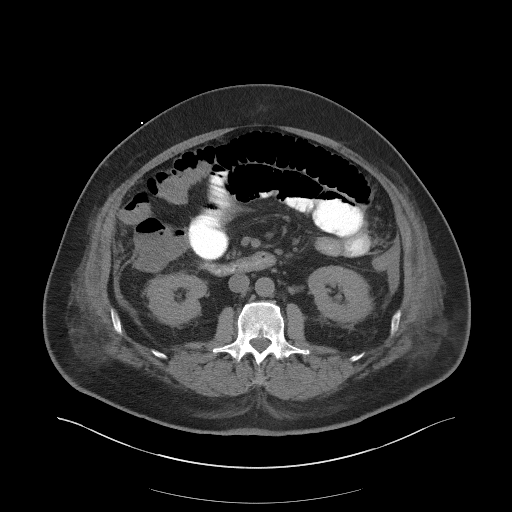
[im 69/104  soft-tissue]
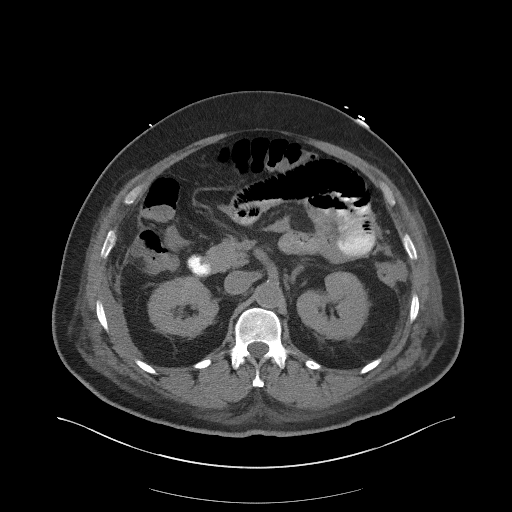
[im 69/104  bone]
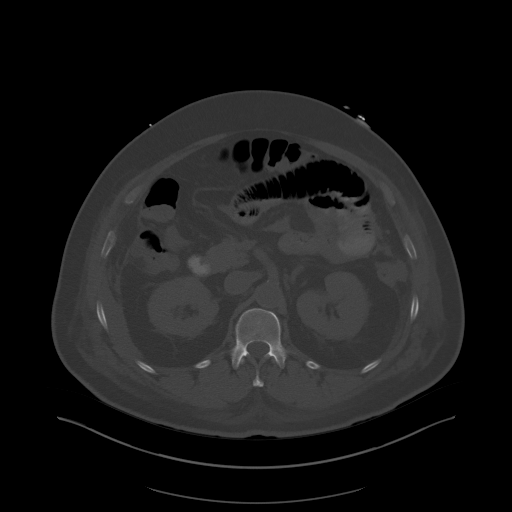
[im 73/104  soft-tissue]
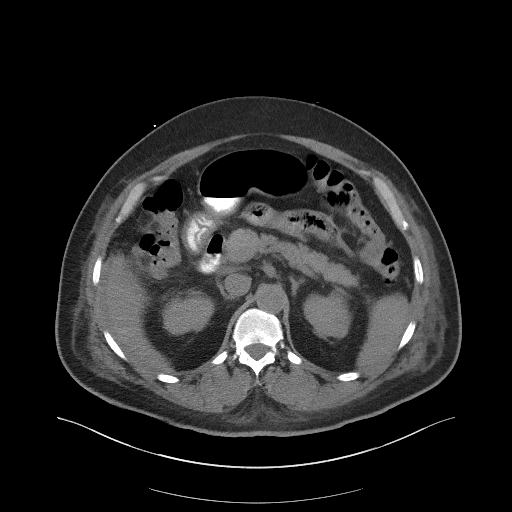
[im 82/104  soft-tissue]
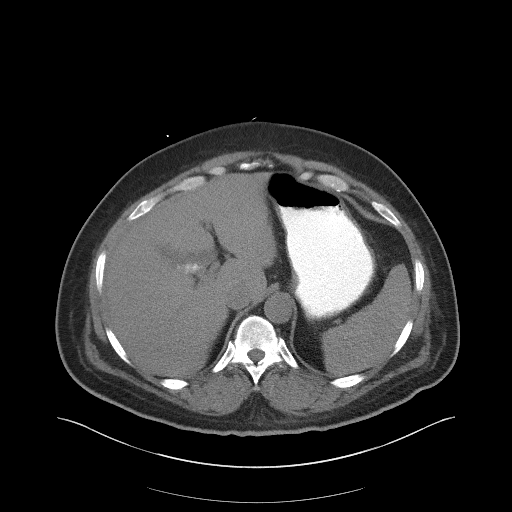
[im 91/104  soft-tissue]
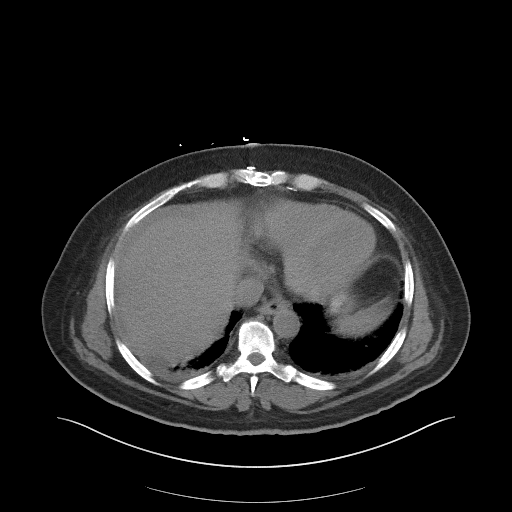
[im 99/104  soft-tissue]
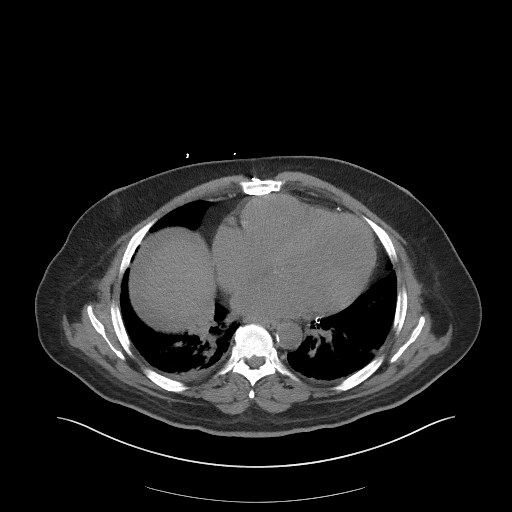

[Series 5: coronal st · coronal · 0.87mm/px · 3 of 118 slices shown]
[im 40/118  soft-tissue]
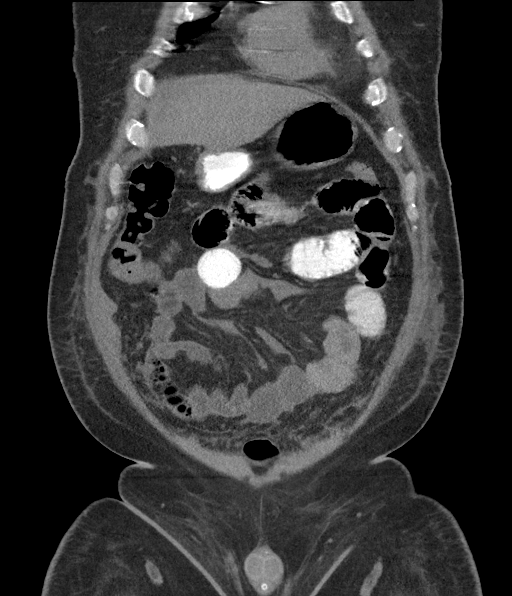
[im 53/118  soft-tissue]
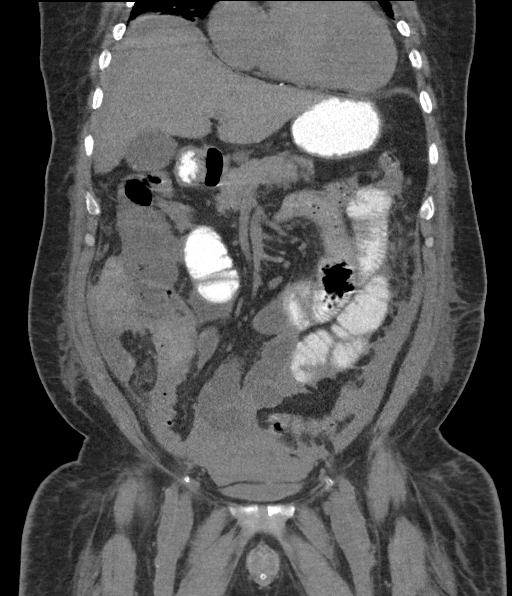
[im 66/118  soft-tissue]
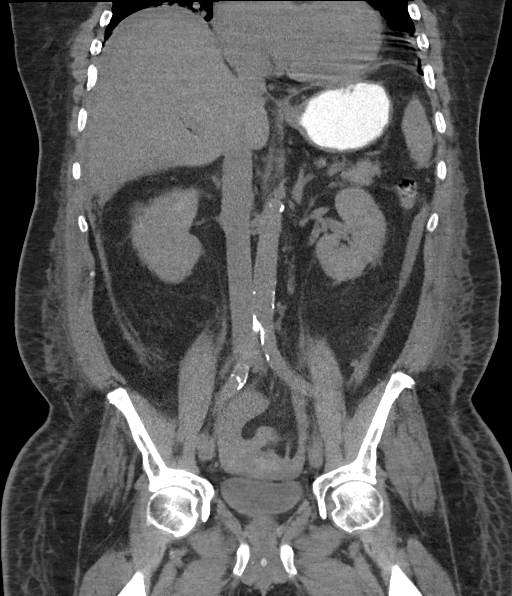

[16 of 46 positions shown; findings below may reference images not displayed]

FINDINGS: Lower chest: Bibasilar atelectasis.  Small right pleural effusion.

Hepatobiliary: Multiple gallstones.  Unremarkable liver.

Pancreas: Unremarkable

Spleen: Unremarkable

Adrenals/Urinary Tract: Kidneys and adrenal glands are within normal
limits. Foley catheter is positioned in the bladder.

Stomach/Bowel: Stomach is distended with contrast. Dilated proximal
small bowel loops are stable. Distal small bowel loops are
decompressed. Overall small bowel distension has slightly improved.
Colon is also decompressed.

Vascular/Lymphatic: Mild aortic and iliac artery vascular
calcifications. No abnormal retroperitoneal adenopathy.

Reproductive: Prostate is small.

Other: Hyperdense free fluid within the lower abdomen and pelvis is
not significantly changed. Small amount of hyperdense fluid about
the liver is stable.

Musculoskeletal: Bilateral L5 pars defects and grade 1
spondylolisthesis. No vertebral compression deformity.
IMPRESSION: Dilated proximal small bowel loops have slightly improved. Distal
small bowel loops in colon rema[REDACTED]ompressed. This suggest an
improving small bowel obstruction pattern.

Stable hyperdense fluid within the lower abdomen and pelvis most
consistent with intraperitoneal hemorrhage.

## 2018-09-15 IMAGING — DX CHEST  1 VIEW
1 series · 1 of 1 positions shown · non-contrast
Comparison: [DATE]

CLINICAL DATA: Hypoxia

EXAM:
CHEST  1 VIEW

[chest ap]
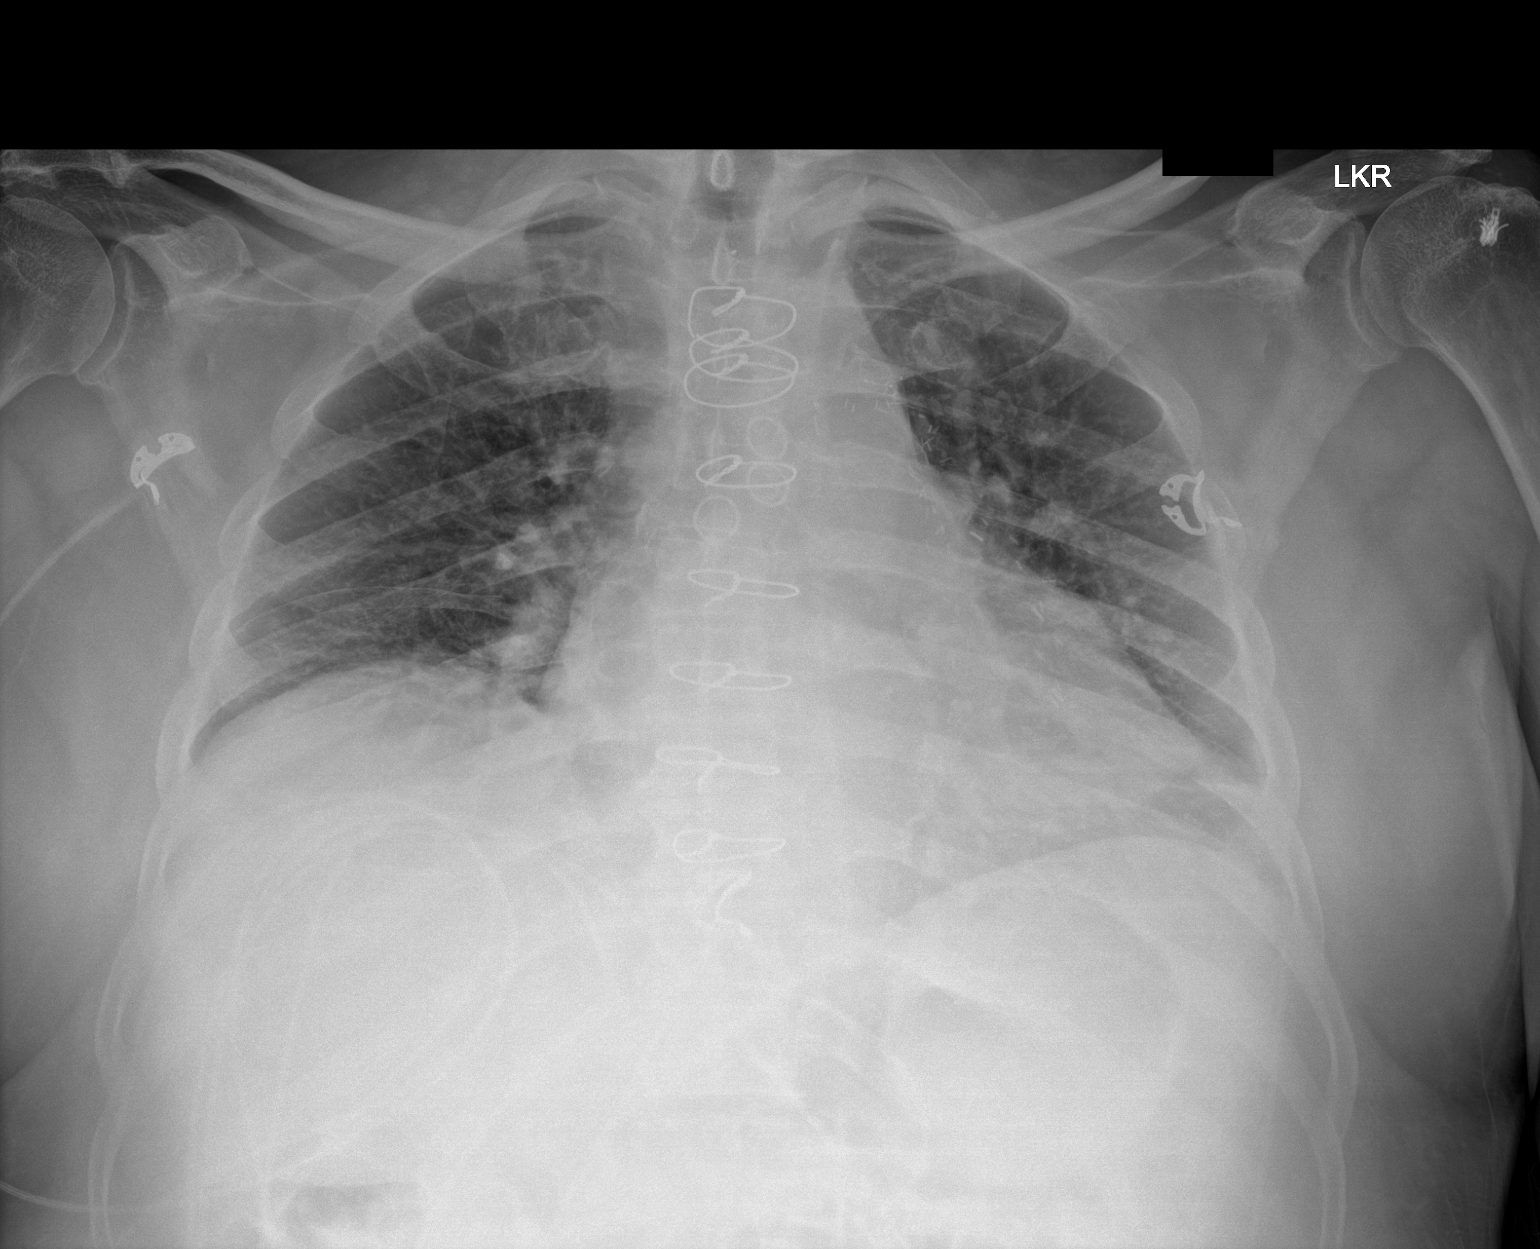

[1 of 1 positions shown; findings below may reference images not displayed]

FINDINGS: Normal heart size. Postoperative changes from CABG. Low volumes and
bibasilar atelectasis. No pneumothorax. No pleural effusion.
IMPRESSION: Bibasilar atelectasis and low lung volumes.

## 2018-09-15 MED ORDER — IOHEXOL 240 MG/ML SOLN
25.0000 mL | INTRAMUSCULAR | Status: AC
  Administered 2018-09-15 (×2): 25 mL via ORAL

## 2018-09-15 MED ORDER — SODIUM CHLORIDE 0.9% IV SOLUTION: Freq: Once | INTRAVENOUS | Status: DC

## 2018-09-15 MED ORDER — LOPERAMIDE HCL 2 MG PO CAPS
2.0000 mg | ORAL_CAPSULE | ORAL | Status: DC | PRN
  Administered 2018-09-16: 2 mg via ORAL
  Filled 2018-09-15 (×3): qty 1

## 2018-09-15 MED ORDER — IOHEXOL 240 MG/ML SOLN: 25.0000 mL | INTRAMUSCULAR | Status: DC

## 2018-09-15 NOTE — Evaluation (Signed)
Physical Therapy Evaluation Patient Details Name: Phillip [name] MRN: [id number] DOB: Feb 08, 1956 Today's Date: 09/15/2018   History of Present Illness  Emmanuell Kantz is a 63yo male who comes to [facility] on 4/7 c 2D ABD pain in RLQ, taken to surgery 4/7 for laproscopic appendectomy. PMH: CHF, PAD, chronic renal insufficicency, CAD s/p CABG, PVD s/p hallux amputation. Pt had ABLA and transfused PRBC on 4/10.   Clinical Impression  Pt admitted with above diagnosis. Pt currently with functional limitations due to the deficits listed below (see "PT Problem List"). Upon entry, pt in chair. The pt is awake and agreeable to participate. Pt denies SOB, dizziness ths date, and he is educated on common symptoms associated with anemia for safety purposes prior to mobility. The pt is alert, calm, pleasant, minimally conversational, and following commands consistently. Heavy effort required for transfer and AMB. Pt unable to AMB unassisted 2/2 gait instability, but gains confidence holding onto IV pole. No assistive device used PTA. Functional mobility assessment demonstrates increased effort/time requirements, poor tolerance, and need for physical assistance, whereas the patient performed these at a higher level of independence PTA. Pt on O2 upon entry, but satting WNL throughout on room air, left on room air at EOS, RN in room made aware.  Pt will benefit from skilled PT intervention to increase independence and safety with basic mobility in preparation for discharge to the venue listed below.       Follow Up Recommendations Home health PT    Equipment Recommendations  Rolling walker with 5" wheels    Recommendations for Other Services       Precautions / Restrictions Precautions Precautions: Fall Restrictions Weight Bearing Restrictions: No      Mobility  Bed Mobility               General bed mobility comments: received up in chair  Transfers Overall transfer level: Needs  assistance Equipment used: None Transfers: Sit to/from Stand Sit to Stand: Min guard         General transfer comment: heavy effort from chair and heavy BUE use; immediate need for fixed objects for stabilization.   Ambulation/Gait Ambulation/Gait assistance: Min guard Gait Distance (Feet): 50 Feet Assistive device: IV Pole(intermittently reaching for walls, or furniture. )   Gait velocity: <0.67m/s.  Gait velocity interpretation: <1.31 ft/sec, indicative of household ambulator General Gait Details: denies SOB, dizziness; SpO2 >92%, HR in 110s sustained. Slow and unsteady, moderate effort required, limited by fatigue in legs.   Stairs            Wheelchair Mobility    Modified Rankin (Stroke Patients Only)       Balance Overall balance assessment: Modified Independent;No apparent balance deficits (not formally assessed)(requires UE support)                                           Pertinent Vitals/Pain Pain Assessment: No/denies pain    Home Living Family/patient expects to be discharged to:: Private residence Living Arrangements: Spouse/significant other Available Help at Discharge: Family Type of Home: Mobile home(double wide) Home Access: Stairs to enter Entrance Stairs-Rails: Can reach both Entrance Stairs-Number of Steps: 3 Home Layout: One level Home Equipment: None      Prior Function Level of Independence: Independent         Comments: community AMB PTA s AD, no falls or balance impairment.  Hand Dominance   Dominant Hand: Right    Extremity/Trunk Assessment   Upper Extremity Assessment Upper Extremity Assessment: Generalized weakness    Lower Extremity Assessment Lower Extremity Assessment: Generalized weakness       Communication   Communication: No difficulties  Cognition Arousal/Alertness: Awake/alert Behavior During Therapy: WFL for tasks assessed/performed Overall Cognitive Status: Within Functional  Limits for tasks assessed                                        General Comments      Exercises     Assessment/Plan    PT Assessment Patient needs continued PT services  PT Problem List Decreased strength;Decreased activity tolerance;Decreased balance;Decreased mobility;Decreased knowledge of precautions;Cardiopulmonary status limiting activity       PT Treatment Interventions Therapeutic exercise;Gait training;DME instruction;Functional mobility training;Stair training;Therapeutic activities;Patient/family education;Balance training    PT Goals (Current goals can be found in the Care Plan section)  Acute Rehab PT Goals Patient Stated Goal: regain strength and independent AMB  PT Goal Formulation: With patient Time For Goal Achievement: 09/29/18 Potential to Achieve Goals: Good    Frequency Min 2X/week   Barriers to discharge        Co-evaluation               AM-PAC PT "6 Clicks" Mobility  Outcome Measure Help needed turning from your back to your side while in a flat bed without using bedrails?: None Help needed moving from lying on your back to sitting on the side of a flat bed without using bedrails?: None Help needed moving to and from a bed to a chair (including a wheelchair)?: A Little Help needed standing up from a chair using your arms (e.g., wheelchair or bedside chair)?: A Little Help needed to walk in hospital room?: A Little Help needed climbing 3-5 steps with a railing? : A Little 6 Click Score: 20    End of Session Equipment Utilized During Treatment: Gait belt;Oxygen(left off O2, satting well) Activity Tolerance: Patient tolerated treatment well;Patient limited by fatigue Patient left: in chair;with nursing/sitter in room;with call bell/phone within reach Nurse Communication: Mobility status;Precautions PT Visit Diagnosis: Unsteadiness on feet (R26.81);Other abnormalities of gait and mobility (R26.89);Muscle weakness  (generalized) (M62.81);Difficulty in walking, not elsewhere classified (R26.2)    Time: 0867-6195 PT Time Calculation (min) (ACUTE ONLY): 23 min   Charges:   PT Evaluation $PT Eval Moderate Complexity: 1 Mod PT Treatments $Therapeutic Exercise: 8-22 mins        1:07 PM, 09/15/18 Etta Grandchild, PT, DPT Physical Therapist - [facility]  [phone] ([place])    Adalay Azucena C 09/15/2018, 1:04 PM

## 2018-09-15 NOTE — Progress Notes (Signed)
[place] Hospital Day(s): 5.   Post op day(s): 7 Days Post-Op.   Interval History: Patient seen and examined, no acute events or new complaints overnight. Patient reports that he is feeling "okay" this morning. No complaints of fever, abdominal pain, nausea, or emesis. RN does report loose stools over the last 2 days, up to 4-5 a day, but these are not watery nor explosive. He has tolerated a diet but reports "he is not eating well." His Hgb is 7.8 this morning, but he denied any light headedness, CP, or SOB,. No evidence of active bleeding.   He did have a new leukocytosis and fever yesterday and underwent repeat CT which again showed RLQ fluid collection thought to be hematoma without abscess. No fevers this morning and leukocytosis has improved.   Renal function continue to mildly improve and is making good urine. U/O was 1.1L yesterday.   Review of Systems:  Constitutional: denies fever, chills  Respiratory: denies any shortness of breath  Cardiovascular: denies chest pain or palpitations  Gastrointestinal: denies abdominal pain, N/V, + diarrhea Integumentary: denies any other rashes or skin discolorations except laparoscopic incisions   Vital signs in last 24 hours: [min-max] current  Temp:  [98.8 F (37.1 C)-101.7 F (38.7 C)] 99.8 F (37.7 C) (04/14 0900) Pulse Rate:  [73-97] 91 (04/14 0900) Resp:  [16-20] 16 (04/14 0632) BP: (150-181)/(67-85) 174/78 (04/14 0900) SpO2:  [90 %-97 %] 97 % (04/14 0900)     Height: 6\' 1"  (185.4 cm) Weight: 110.2 kg BMI (Calculated): 32.07   Intake/Output this shift:  Total I/O In: 100.9 [I.V.:50.9; IV Piggyback:50] Out: -     Physical Exam:  Constitutional: alert, cooperative and no distress  Respiratory: breathing non-labored at rest, on 1L [place]  Cardiovascular: regular rate and sinus rhythm  Gastrointestinal: soft, non-tender, and non-distended Integumentary: Laparoscopic incisions are CDI, no  erythema or drainage   Labs:  CBC Latest Ref Rng & Units 09/15/2018 09/14/2018 09/14/2018  WBC 4.0 - 10.5 K/uL 13.9(H) 15.0(H) 12.6(H)  Hemoglobin 13.0 - 17.0 g/dL 7.8(L) 8.3(L) 8.5(L)  Hematocrit 39.0 - 52.0 % 25.1(L) 26.0(L) 26.8(L)  Platelets 150 - 400 K/uL 183 218 184   CMP Latest Ref Rng & Units 09/15/2018 09/14/2018 09/14/2018  Glucose 70 - 99 mg/dL 130(H) 126(H) 131(H)  BUN 8 - 23 mg/dL 87(H) 85(H) 96(H)  Creatinine 0.61 - 1.24 mg/dL 5.36(H) 5.32(H) 6.08(H)  Sodium 135 - 145 mmol/L 143 141 143  Potassium 3.5 - 5.1 mmol/L 3.2(L) 3.4(L) 3.4(L)  Chloride 98 - 111 mmol/L 96(L) 95(L) 97(L)  CO2 22 - 32 mmol/L 32 31 31  Calcium 8.9 - 10.3 mg/dL 7.6(L) 7.7(L) 7.7(L)  Total Protein 6.5 - 8.1 g/dL - - -  Total Bilirubin 0.3 - 1.2 mg/dL - - -  Alkaline Phos 38 - 126 U/L - - -  AST 15 - 41 U/L - - -  ALT 0 - 44 U/L - - -     Imaging studies:   CT Abdomen/Pelvis (09/15/2018) personally reviewed and radiologist report reviewed:  IMPRESSION: 1) Dilated proximal small bowel loops have slightly improved. Distal small bowel loops in colon remain decompressed. This suggest an improving small bowel obstruction pattern. 2) Stable hyperdense fluid within the lower abdomen and pelvis most consistent with intraperitoneal hemorrhage.  CXR (09/15/2018) personally reviewed and radiologist report reviewed:  IMPRESSION: Bibasilar atelectasis and low lung volumes.   Assessment/Plan: (ICD-10's: K35.80) 63 y.o. male with improving leukocytosis compared to yesterday and has  remained afebrile without evidence of abscess on CT, new diarrhea likely resolving ileus rather than c diff, and improving renal function 7 Days Post-Op s/p laparoscopic appendectomy for acute appendicitis.   - Okay to resume renal diet   - Continue IV ABx (Zosyn); renal dosing             - Continue IVF (Sodium Bicarb @ 75 ml/hr)             - pain control prn (minimize narcotics); antiemetics as needed             - Monitor  abdominal exam; on-going bowel function  - Monitor hgb; will discuss with medicine team whether he should be transfused given his cardiac comorbidities             - trend leukocytosis: morning CBC             - Trend renal function; morning BMP              - Appreciate nephrology involvement; no indication for HD at this time             - Appreciate medicine's help with comorbidities             - DVT prophylaxis   All of the above findings and recommendations were discussed with the patient, and the medical team, and all of patient's questions were answered to his expressed satisfaction.  -- Edison Simon, PA-C [facility] 09/15/2018, 10:37 AM [phone] M-F: 7am - 4pm

## 2018-09-15 NOTE — Progress Notes (Signed)
Patient's spouse called asking for update. Patient's spouse also was asking when patient may be discharged. Patient's spouse needs physician to contact her on the plan for possible discharge.   Fuller Mandril, RN

## 2018-09-15 NOTE — Progress Notes (Signed)
*  PRELIMINARY RESULTS* Echocardiogram 2D Echocardiogram has been performed.  Phillip [name] 09/15/2018, 2:45 PM

## 2018-09-15 NOTE — Progress Notes (Signed)
[place] [facility] [place] NAME: Phillip [name]    MR#:  [id number]  DATE OF BIRTH:  07-25-1955  SUBJECTIVE:  CHIEF COMPLAINT:   Chief Complaint  Patient presents with   Abdominal Pain   Patient still weak but reports is feeling much better today.  He was able to get out of the bed and sit at the side of the bed yesterday.  Denies any concerns.  No significant events reported overnight. Was able to tolerate food/ liquids.  REVIEW OF SYSTEMS:  Review of Systems  Constitutional: Negative for chills and fever.  HENT: Negative for sore throat.   Respiratory: Negative for cough, shortness of breath and wheezing.   Cardiovascular: Negative for chest pain and leg swelling.  Gastrointestinal: Negative for abdominal pain, diarrhea, nausea and vomiting.  Genitourinary: Negative for dysuria.  Musculoskeletal: Negative for back pain and myalgias.  Neurological: Negative for dizziness, sensory change, speech change, focal weakness and headaches.   DRUG ALLERGIES:  No Known Allergies  VITALS:  Blood pressure (!) 169/85, pulse 90, temperature 98.8 F (37.1 C), temperature source Oral, resp. rate 16, height 6\' 1"  (1.854 m), weight 110.2 kg, SpO2 91 %.  PHYSICAL EXAMINATION:   GENERAL:  63 y.o.-year-old patient lying in the bed with no acute distress.  EYES: Pupils equal, round, reactive to light and accommodation. No scleral icterus. Extraocular muscles intact.  HEENT: Head atraumatic, normocephalic. Oropharynx and nasopharynx clear.  NECK:  Supple, no jugular venous distention. No thyroid enlargement, no tenderness.  LUNGS: Normal breath sounds bilaterally, no wheezing, rales,rhonchi or crepitation. No use of accessory muscles of respiration.  CARDIOVASCULAR: S1, S2 normal. No murmurs, rubs, or gallops.  ABDOMEN: Soft,tender, nondistended. Bowel sounds present. No organomegaly or mass.  EXTREMITIES: No pedal edema, cyanosis, or clubbing.    NEUROLOGIC: Cranial nerves II through XII are intact. Muscle strength 5/5 in all extremities. Sensation intact. Gait not checked.  PSYCHIATRIC: The patient is alert and oriented x 3.  SKIN: No obvious rash, lesion, or ulcer.   LABORATORY PANEL:   CBC Recent Labs  Lab 09/15/18 0249  WBC 13.9*  HGB 7.8*  HCT 25.1*  PLT 183   ------------------------------------------------------------------------------------------------------------------  Chemistries  Recent Labs  Lab 09/12/18 0526  09/15/18 0249  NA 141   < > 143  K 4.4   < > 3.2*  CL 100   < > 96*  CO2 25   < > 32  GLUCOSE 161*   < > 130*  BUN 106*   < > 87*  CREATININE 7.93*   < > 5.36*  CALCIUM 7.8*   < > 7.6*  MG  --   --  2.1  AST 25  --   --   ALT 31  --   --   ALKPHOS 43  --   --   BILITOT 1.0  --   --    < > = values in this interval not displayed.   ------------------------------------------------------------------------------------------------------------------  Cardiac Enzymes No results for input(s): TROPONINI in the last 168 hours. ------------------------------------------------------------------------------------------------------------------  RADIOLOGY:  Ct Abdomen Pelvis Wo Contrast  Result Date: 09/15/2018 CLINICAL DATA:  Fever.  Status post appendectomy. EXAM: CT ABDOMEN AND PELVIS WITHOUT CONTRAST TECHNIQUE: Multidetector CT imaging of the abdomen and pelvis was performed following the standard protocol without IV contrast. COMPARISON:  09/11/2018 FINDINGS: Lower chest: Bibasilar atelectasis.  Small right pleural effusion. Hepatobiliary: Multiple gallstones.  Unremarkable liver. Pancreas: Unremarkable Spleen: Unremarkable Adrenals/Urinary Tract: Kidneys and  adrenal glands are within normal limits. Foley catheter is positioned in the bladder. Stomach/Bowel: Stomach is distended with contrast. Dilated proximal small bowel loops are stable. Distal small bowel loops are decompressed. Overall small bowel  distension has slightly improved. Colon is also decompressed. Vascular/Lymphatic: Mild aortic and iliac artery vascular calcifications. No abnormal retroperitoneal adenopathy. Reproductive: Prostate is small. Other: Hyperdense free fluid within the lower abdomen and pelvis is not significantly changed. Small amount of hyperdense fluid about the liver is stable. Musculoskeletal: Bilateral L5 pars defects and grade 1 spondylolisthesis. No vertebral compression deformity. IMPRESSION: Dilated proximal small bowel loops have slightly improved. Distal small bowel loops in colon remain decompressed. This suggest an improving small bowel obstruction pattern. Stable hyperdense fluid within the lower abdomen and pelvis most consistent with intraperitoneal hemorrhage. Electronically Signed   By: Marybelle Killings M.D.   On: 09/15/2018 07:54   Dg Chest Port 1 View  Result Date: 09/14/2018 CLINICAL DATA:  Fevers EXAM: PORTABLE CHEST 1 VIEW COMPARISON:  03/25/2018 FINDINGS: Cardiac shadow is prominent but stable. Postsurgical changes are again noted. The overall inspiratory effort is poor. Mild increased vascular congestion noted without significant edema. Bibasilar atelectatic changes are noted. No bony abnormality is noted. IMPRESSION: Mild bibasilar atelectasis. Mild vascular congestion. Electronically Signed   By: Inez Catalina M.D.   On: 09/14/2018 21:59    EKG:  No orders found for this or any previous visit.  ASSESSMENT AND PLAN:   63 year old male presenting with  right lower quadrant pain, nausea, fever of 102.5 and chills.CT abdomen and pelvis showed evidence of dilated appendix with appendiceal inflammation consistent with acute appendicitis.  #1. Acute renal failure on chronic kidney disease stage III with proteinuria: baseline creatinine of 2.23, GFR of 35 on 08/10/2018.  Likely secondary to ATN from IV contrast nephropathy, acute appendicitis -Nephrology following -Holding ACE inhibitors, diuretics and  nephrotoxins -IVFs hydration with sodium bicarb. -Per nephrology no indication for dialysis at this time as have good urine out put.  2. Hypertension:  BP 157/78 this morning. Allowing permissive hypertension per nephrology -Continue hydralazine, carvedilol and isosorbide mononitrate.  3. Insulin-dependent diabetes mellitus with complications: Not well controlled, was on glipizide and Levemir at home -hemoglobin A1c high. -on sliding scale and long-acting -Diabetes coordinator to see for blood sugar management  4.  Anemia of chronic disease: Hemoglobin 10.3 status post PRBC transfusion on 4/10 improved at 8.3  5.  Acute appendicitis: s/p laparoscopic appendectomyfor acute appendicitis. Post OP #5 -Management per surgery and GI  6. Congestive Heart Failure: Mild cardiomyopathy. Last Echo 2013 -On carvedilol and isosorbide mononitrate -Check Daily weights, close monitoring of I&O -Holding diuretics -Recommend Low salt diet  -Will need outpatient follow up with cardiology  6. GERD: On Protonix for GI prophylaxis with   7.  DVT prophylaxis - Holding anticoagulation and antiplatelets given elevated creatinine and history of bleed?.  He is ambulatory now/SCDs.   All the records are reviewed and case discussed with Care Management/Social Workerr. Management plans discussed with the patient, family and they are in agreement.  CODE STATUS: FULL  TOTAL TIME TAKING CARE OF THIS PATIENT: 36 minutes.   POSSIBLE D/C IN 1-2 DAYS, DEPENDING ON CLINICAL CONDITION.   Collene Mares   09/15/2018 at 8:47 AM  Between 7am to 6pm - Pager - [phone]  After 6pm go to [email or web address] - password [email or web address] [facility]  Office  [phone]  CC: Primary care physician; Cyndi Bender, PA-C

## 2018-09-15 NOTE — Progress Notes (Signed)
Phillip [name] at [place] NAME: Phillip [name]    MR#:  [id number]  DATE OF BIRTH:  1956/05/21  SUBJECTIVE:  CHIEF COMPLAINT:   Chief Complaint  Patient presents with  . Abdominal Pain   Patient still weak but reports is feeling much better today.  He was able to get out of the bed and sit at the side of the bed yesterday.  Denies any concerns.  Was able to tolerate food/ liquids.  Had fever last night and have some drop in Hb today.  REVIEW OF SYSTEMS:  Review of Systems  Constitutional: Negative for chills and fever.  HENT: Negative for sore throat.   Respiratory: Negative for cough, shortness of breath and wheezing.   Cardiovascular: Negative for chest pain and leg swelling.  Gastrointestinal: Negative for abdominal pain, diarrhea, nausea and vomiting.  Genitourinary: Negative for dysuria.  Musculoskeletal: Negative for back pain and myalgias.  Neurological: Negative for dizziness, sensory change, speech change, focal weakness and headaches.   DRUG ALLERGIES:  No Known Allergies  VITALS:  Blood pressure (!) 150/76, pulse (!) 55, temperature 99.6 F (37.6 C), temperature source Oral, resp. rate 16, height 6\' 1"  (1.854 m), weight 110.2 kg, SpO2 93 %.  PHYSICAL EXAMINATION:   GENERAL:  63 y.o.-year-old patient lying in the bed with no acute distress.  EYES: Pupils equal, round, reactive to light and accommodation. No scleral icterus. Extraocular muscles intact.  HEENT: Head atraumatic, normocephalic. Oropharynx and nasopharynx clear.  NECK:  Supple, no jugular venous distention. No thyroid enlargement, no tenderness.  LUNGS: Normal breath sounds bilaterally, no wheezing, rales,rhonchi or crepitation. No use of accessory muscles of respiration.  CARDIOVASCULAR: S1, S2 normal. No murmurs, rubs, or gallops.  ABDOMEN: Soft,tender, some distended. Bowel sounds present. No organomegaly or mass.  EXTREMITIES: No pedal edema, cyanosis, or  clubbing.  NEUROLOGIC: Cranial nerves II through XII are intact. Muscle strength 5/5 in all extremities. Sensation intact. Gait not checked.  PSYCHIATRIC: The patient is alert and oriented x 3.  SKIN: No obvious rash, lesion, or ulcer.   LABORATORY PANEL:   CBC Recent Labs  Lab 09/15/18 0249  WBC 13.9*  HGB 7.8*  HCT 25.1*  PLT 183   ------------------------------------------------------------------------------------------------------------------  Chemistries  Recent Labs  Lab 09/12/18 0526  09/15/18 0249  NA 141   < > 143  K 4.4   < > 3.2*  CL 100   < > 96*  CO2 25   < > 32  GLUCOSE 161*   < > 130*  BUN 106*   < > 87*  CREATININE 7.93*   < > 5.36*  CALCIUM 7.8*   < > 7.6*  MG  --   --  2.1  AST 25  --   --   ALT 31  --   --   ALKPHOS 43  --   --   BILITOT 1.0  --   --    < > = values in this interval not displayed.   ------------------------------------------------------------------------------------------------------------------  Cardiac Enzymes No results for input(s): TROPONINI in the last 168 hours. ------------------------------------------------------------------------------------------------------------------  RADIOLOGY:  Ct Abdomen Pelvis Wo Contrast  Result Date: 09/15/2018 CLINICAL DATA:  Fever.  Status post appendectomy. EXAM: CT ABDOMEN AND PELVIS WITHOUT CONTRAST TECHNIQUE: Multidetector CT imaging of the abdomen and pelvis was performed following the standard protocol without IV contrast. COMPARISON:  09/11/2018 FINDINGS: Lower chest: Bibasilar atelectasis.  Small right pleural effusion. Hepatobiliary: Multiple gallstones.  Unremarkable liver.  Pancreas: Unremarkable Spleen: Unremarkable Adrenals/Urinary Tract: Kidneys and adrenal glands are within normal limits. Foley catheter is positioned in the bladder. Stomach/Bowel: Stomach is distended with contrast. Dilated proximal small bowel loops are stable. Distal small bowel loops are decompressed. Overall  small bowel distension has slightly improved. Colon is also decompressed. Vascular/Lymphatic: Mild aortic and iliac artery vascular calcifications. No abnormal retroperitoneal adenopathy. Reproductive: Prostate is small. Other: Hyperdense free fluid within the lower abdomen and pelvis is not significantly changed. Small amount of hyperdense fluid about the liver is stable. Musculoskeletal: Bilateral L5 pars defects and grade 1 spondylolisthesis. No vertebral compression deformity. IMPRESSION: Dilated proximal small bowel loops have slightly improved. Distal small bowel loops in colon remain decompressed. This suggest an improving small bowel obstruction pattern. Stable hyperdense fluid within the lower abdomen and pelvis most consistent with intraperitoneal hemorrhage. Electronically Signed   By: Marybelle Killings M.D.   On: 09/15/2018 07:54   Dg Chest 1 View  Result Date: 09/15/2018 CLINICAL DATA:  Hypoxia EXAM: CHEST  1 VIEW COMPARISON:  09/14/2018 FINDINGS: Normal heart size. Postoperative changes from CABG. Low volumes and bibasilar atelectasis. No pneumothorax. No pleural effusion. IMPRESSION: Bibasilar atelectasis and low lung volumes. Electronically Signed   By: Marybelle Killings M.D.   On: 09/15/2018 09:02   Dg Chest Port 1 View  Result Date: 09/14/2018 CLINICAL DATA:  Fevers EXAM: PORTABLE CHEST 1 VIEW COMPARISON:  03/25/2018 FINDINGS: Cardiac shadow is prominent but stable. Postsurgical changes are again noted. The overall inspiratory effort is poor. Mild increased vascular congestion noted without significant edema. Bibasilar atelectatic changes are noted. No bony abnormality is noted. IMPRESSION: Mild bibasilar atelectasis. Mild vascular congestion. Electronically Signed   By: Inez Catalina M.D.   On: 09/14/2018 21:59    EKG:  No orders found for this or any previous visit.  ASSESSMENT AND PLAN:   63 year old male presenting with  right lower quadrant pain, nausea, fever of 102.5 and chills.CT  abdomen and pelvis showed evidence of dilated appendix with appendiceal inflammation consistent with acute appendicitis.  1. Acute renal failure on chronic kidney disease stage III with proteinuria: baseline creatinine of 2.23, GFR of 35 on 08/10/2018.  Likely secondary to ATN from IV contrast nephropathy, acute appendicitis -Nephrology following -Holding ACE inhibitors, diuretics and nephrotoxins -IVFs hydration with sodium bicarb. -Per nephrology no indication for dialysis at this time as have good urine out put.  2. Hypertension:  BP 157/78 this morning. Allowing permissive hypertension per nephrology -Continue hydralazine, carvedilol and isosorbide mononitrate.  3. Insulin-dependent diabetes mellitus with complications: Not well controlled, was on glipizide and Levemir at home -hemoglobin A1c high. -on sliding scale and long-acting -Diabetes coordinator had seen for blood sugar management- added lantus and meal time coverage.  4.  Anemia of chronic disease: Hemoglobin 10.3 status post PRBC transfusion on 4/10 improved at 8.3- again dropped < 8, transfuse 1 unit again.  5.  Acute appendicitis: s/p laparoscopic appendectomyfor acute appendicitis. Post OP #5 - Management per surgery and GI.  6. Congestive Heart Failure: Mild cardiomyopathy. Last Echo 2013 -On carvedilol and isosorbide mononitrate -Check Daily weights, close monitoring of I&O -Holding diuretics -Recommend Low salt diet  -Will need outpatient follow up with cardiology - let's get echo here to get idea of EF now as needing IV fluids and blood transfusion.  6. GERD: On Protonix for GI prophylaxis with   7.  DVT prophylaxis - Holding anticoagulation and antiplatelets given elevated creatinine and history of bleed?.  He is ambulatory now/SCDs.  8. Fever- blood cx sent. CT abd have some intraperitoneal hemorrhage- this could be reason, monitor per surgical team.  All the records are reviewed and case discussed  with Care Management/Social Workerr. Management plans discussed with the patient, family and they are in agreement.  CODE STATUS: FULL  TOTAL TIME TAKING CARE OF THIS PATIENT: 36 minutes.   POSSIBLE D/C IN 1-2 DAYS, DEPENDING ON CLINICAL CONDITION.   Collene Mares   09/15/2018 at 4:04 PM  Between 7am to 6pm - Pager - [phone]  After 6pm go to [email or web address] - password [email or web address] [facility]  Office  [phone]  CC: Primary care physician; Cyndi Bender, PA-C

## 2018-09-15 NOTE — Progress Notes (Signed)
[facility]  ROUNDING NOTE   Subjective:  Patient seen at bedside. A bit lethargic today but arousable. Urine output yesterday was 1.1 L. Patient still on IV fluids.   Objective:  Vital signs in last 24 hours:  Temp:  [98.8 F (37.1 C)-101.7 F (38.7 C)] 99.9 F (37.7 C) (04/14 1150) Pulse Rate:  [73-112] 112 (04/14 1205) Resp:  [16-19] 16 (04/14 0632) BP: (150-181)/(67-85) 181/80 (04/14 1150) SpO2:  [90 %-97 %] 93 % (04/14 1205)  Weight change:  Filed Weights   09/08/18 1239  Weight: 110.2 kg    Intake/Output: I/O last 3 completed shifts: In: 2234 [P.O.:240; I.V.:1949; IV Piggyback:45] Out: 1950 [Urine:1950]   Intake/Output this shift:  Total I/O In: 100.9 [I.V.:50.9; IV Piggyback:50] Out: 600 [Urine:600]  Physical Exam: General: NAD  Head: Normocephalic, atraumatic. Moist oral mucosal membranes  Eyes: Anicteric  Neck: Supple, trachea midline  Lungs:  Clear to auscultation  Heart: Regular rate and rhythm  Abdomen:  Soft, nontender  Extremities: 1+ peripheral edema.  Neurologic: Lethargic but arousable  Skin: No lesions  Access: none    Basic Metabolic Panel: Recent Labs  Lab 09/11/18 0300 09/12/18 0526 09/13/18 0522 09/14/18 0302 09/14/18 2101 09/15/18 0249  NA 141 141 141 143 141 143  K 4.9 4.4 3.8 3.4* 3.4* 3.2*  CL 106 100 99 97* 95* 96*  CO2 22 25 27 31 31  32  GLUCOSE 167* 161* 178* 131* 126* 130*  BUN 90* 106* 109* 96* 85* 87*  CREATININE 7.02* 7.93* 7.45* 6.08* 5.32* 5.36*  CALCIUM 7.8* 7.8* 7.5* 7.7* 7.7* 7.6*  MG 2.2  --   --   --   --  2.1  PHOS 7.0* 7.4* 6.9* 5.6*  --  5.2*    Liver Function Tests: Recent Labs  Lab 09/08/18 1315 09/12/18 0526  AST 18 25  ALT 12 31  ALKPHOS 76 43  BILITOT 1.0 1.0  PROT 7.4 7.0  ALBUMIN 4.0 3.1*   Recent Labs  Lab 09/08/18 1315  LIPASE 28   No results for input(s): AMMONIA in the last 168 hours.  CBC: Recent Labs  Lab 09/11/18 0300 09/11/18 2326 09/12/18 0526  09/14/18 0302 09/14/18 2101 09/15/18 0249  WBC 21.4*  --  16.1* 12.6* 15.0* 13.9*  NEUTROABS  --   --   --   --   --  10.5*  HGB 7.3* 8.2* 8.3* 8.5* 8.3* 7.8*  HCT 23.3* 25.3* 26.2* 26.8* 26.0* 25.1*  MCV 89.3  --  87.6 87.3 88.4 89.0  PLT 166  --  170 184 218 183    Cardiac Enzymes: No results for input(s): CKTOTAL, CKMB, CKMBINDEX, TROPONINI in the last 168 hours.  BNP: Invalid input(s): POCBNP  CBG: Recent Labs  Lab 09/14/18 1133 09/14/18 1633 09/14/18 2120 09/15/18 0754 09/15/18 1142  GLUCAP 155* 155* 121* 142* 145*    Microbiology: Results for orders placed or performed during the hospital encounter of 09/08/18  Culture, blood (routine x 2)     Status: None (Preliminary result)   Collection Time: 09/14/18  9:02 PM  Result Value Ref Range Status   Specimen Description BLOOD BLOOD RIGHT HAND  Final   Special Requests   Final    BOTTLES DRAWN AEROBIC AND ANAEROBIC Blood Culture adequate volume   Culture   Final    NO GROWTH < 12 HOURS Performed at [facility], [address] [place], [place] [address]    Report Status PENDING  Incomplete  Culture, blood (routine  x 2)     Status: None (Preliminary result)   Collection Time: 09/14/18  9:09 PM  Result Value Ref Range Status   Specimen Description BLOOD RIGHT ANTECUBITAL  Final   Special Requests   Final    BOTTLES DRAWN AEROBIC AND ANAEROBIC Blood Culture adequate volume   Culture   Final    NO GROWTH < 12 HOURS Performed at [facility], [address] [place], [place] [address]    Report Status PENDING  Incomplete    Coagulation Studies: No results for input(s): LABPROT, INR in the last 72 hours.  Urinalysis: Recent Labs    09/14/18 2019  COLORURINE YELLOW*  LABSPEC 1.018  PHURINE 5.0  GLUCOSEU NEGATIVE  HGBUR MODERATE*  BILIRUBINUR NEGATIVE  KETONESUR NEGATIVE  PROTEINUR 100*  NITRITE NEGATIVE  LEUKOCYTESUR NEGATIVE      Imaging: Ct Abdomen Pelvis Wo  Contrast  Result Date: 09/15/2018 CLINICAL DATA:  Fever.  Status post appendectomy. EXAM: CT ABDOMEN AND PELVIS WITHOUT CONTRAST TECHNIQUE: Multidetector CT imaging of the abdomen and pelvis was performed following the standard protocol without IV contrast. COMPARISON:  09/11/2018 FINDINGS: Lower chest: Bibasilar atelectasis.  Small right pleural effusion. Hepatobiliary: Multiple gallstones.  Unremarkable liver. Pancreas: Unremarkable Spleen: Unremarkable Adrenals/Urinary Tract: Kidneys and adrenal glands are within normal limits. Foley catheter is positioned in the bladder. Stomach/Bowel: Stomach is distended with contrast. Dilated proximal small bowel loops are stable. Distal small bowel loops are decompressed. Overall small bowel distension has slightly improved. Colon is also decompressed. Vascular/Lymphatic: Mild aortic and iliac artery vascular calcifications. No abnormal retroperitoneal adenopathy. Reproductive: Prostate is small. Other: Hyperdense free fluid within the lower abdomen and pelvis is not significantly changed. Small amount of hyperdense fluid about the liver is stable. Musculoskeletal: Bilateral L5 pars defects and grade 1 spondylolisthesis. No vertebral compression deformity. IMPRESSION: Dilated proximal small bowel loops have slightly improved. Distal small bowel loops in colon remain decompressed. This suggest an improving small bowel obstruction pattern. Stable hyperdense fluid within the lower abdomen and pelvis most consistent with intraperitoneal hemorrhage. Electronically Signed   By: Marybelle Killings M.D.   On: 09/15/2018 07:54   Dg Chest 1 View  Result Date: 09/15/2018 CLINICAL DATA:  Hypoxia EXAM: CHEST  1 VIEW COMPARISON:  09/14/2018 FINDINGS: Normal heart size. Postoperative changes from CABG. Low volumes and bibasilar atelectasis. No pneumothorax. No pleural effusion. IMPRESSION: Bibasilar atelectasis and low lung volumes. Electronically Signed   By: Marybelle Killings M.D.   On:  09/15/2018 09:02   Dg Chest Port 1 View  Result Date: 09/14/2018 CLINICAL DATA:  Fevers EXAM: PORTABLE CHEST 1 VIEW COMPARISON:  03/25/2018 FINDINGS: Cardiac shadow is prominent but stable. Postsurgical changes are again noted. The overall inspiratory effort is poor. Mild increased vascular congestion noted without significant edema. Bibasilar atelectatic changes are noted. No bony abnormality is noted. IMPRESSION: Mild bibasilar atelectasis. Mild vascular congestion. Electronically Signed   By: Inez Catalina M.D.   On: 09/14/2018 21:59     Medications:   . sodium chloride Stopped (09/14/18 1413)  . piperacillin-tazobactam (ZOSYN)  IV 3.375 g (09/15/18 0908)  .  sodium bicarbonate  infusion 1000 mL 75 mL/hr at 09/15/18 0927   . acetaminophen  1,000 mg Oral Q6H  . carvedilol  6.25 mg Oral BID WC  . insulin aspart  0-15 Units Subcutaneous TID WC  . insulin aspart  0-5 Units Subcutaneous QHS  . insulin aspart  4 Units Subcutaneous TID WC  . insulin glargine  10 Units Subcutaneous  BID  . isosorbide mononitrate  120 mg Oral Daily  . pantoprazole  40 mg Oral QHS  . polyethylene glycol  17 g Oral Daily   sodium chloride, dextrose, hydrALAZINE, HYDROmorphone (DILAUDID) injection, loperamide, nitroGLYCERIN, ondansetron **OR** ondansetron (ZOFRAN) IV, oxyCODONE, prochlorperazine **OR** prochlorperazine, traMADol  Assessment/ Plan:  [name] is a 63 y.o. black male with diabetes mellitus type II, diabetic neuropathy, hypertension, coronary artery disease status post CABG, peripheral vascular disease, status post left toe amputation, congestive heart failure, asthma, who was admitted to [facility] on 09/08/2018 for Acute appendicitis   1. Acute renal failure on chronic kidney disease stage III with proteinuria: baseline creatinine of 2.23, GFR of 35 on 08/10/2018.  Chronic Kidney Disease secondary to diabetic nephropathy Acute renal failure secondary to ATN from IV contrast nephropathy (4/7) and  possibly from acute illness, acute appendicitis.  No indication for dialysis. Nonoliguric.  - Holding lisinopril, furosemide, spironolactone, potassium chloride, and metolazone.  -BUN and creatinine about the same today at 87 and 5.36 respectively.  Urine output was 1.1 L over the preceding 24 hours.  No urgent indication for dialysis at the moment however we may need to consider this should his renal function remains low.  2. Hypertension: Patient was given a dose of IV hydralazine earlier.  3. Diabetes mellitus type II with chronic kidney disease: insulin dependent. not well controlled. Hemoglobin A1c of 9.1% on 01/14/18  4. Anemia with renal failure: Hemoglobin down to 7.8.  CT scan of abdomen and pelvis reviewed.  Await further input from surgery.   LOS: 5 Averleigh Savary 4/14/202012:50 PM

## 2018-09-16 LAB — COMPREHENSIVE METABOLIC PANEL
ALT: 16 U/L (ref 0–44)
AST: 25 U/L (ref 15–41)
Albumin: 2.9 g/dL — ABNORMAL LOW (ref 3.5–5.0)
Alkaline Phosphatase: 47 U/L (ref 38–126)
Anion gap: 15 (ref 5–15)
BUN: 81 mg/dL — ABNORMAL HIGH (ref 8–23)
CO2: 37 mmol/L — ABNORMAL HIGH (ref 22–32)
Calcium: 7.9 mg/dL — ABNORMAL LOW (ref 8.9–10.3)
Chloride: 90 mmol/L — ABNORMAL LOW (ref 98–111)
Creatinine, Ser: 4.85 mg/dL — ABNORMAL HIGH (ref 0.61–1.24)
GFR calc Af Amer: 14 mL/min — ABNORMAL LOW (ref >60)
GFR calc non Af Amer: 12 mL/min — ABNORMAL LOW (ref >60)
Glucose, Bld: 159 mg/dL — ABNORMAL HIGH (ref 70–99)
Potassium: 3 mmol/L — ABNORMAL LOW (ref 3.5–5.1)
Sodium: 142 mmol/L (ref 135–145)
Total Bilirubin: 1.6 mg/dL — ABNORMAL HIGH (ref 0.3–1.2)
Total Protein: 6.9 g/dL (ref 6.5–8.1)

## 2018-09-16 LAB — C DIFFICILE QUICK SCREEN W PCR REFLEX
C Diff antigen: NEGATIVE
C Diff toxin: NEGATIVE

## 2018-09-16 LAB — C DIFFICILE QUICK SCREEN W PCR REFLEX??: C Diff interpretation: NOT DETECTED

## 2018-09-16 LAB — GLUCOSE, CAPILLARY
Glucose-Capillary: 147 mg/dL — ABNORMAL HIGH (ref 70–99)
Glucose-Capillary: 142 mg/dL — ABNORMAL HIGH (ref 70–99)
Glucose-Capillary: 88 mg/dL (ref 70–99)
Glucose-Capillary: 85 mg/dL (ref 70–99)

## 2018-09-16 LAB — CBC
HCT: 29.1 % — ABNORMAL LOW (ref 39.0–52.0)
Hemoglobin: 9.1 g/dL — ABNORMAL LOW (ref 13.0–17.0)
MCH: 27.6 pg (ref 26.0–34.0)
MCHC: 31.3 g/dL (ref 30.0–36.0)
MCV: 88.2 fL (ref 80.0–100.0)
Platelets: 258 10*3/uL (ref 150–400)
RBC: 3.3 MIL/uL — ABNORMAL LOW (ref 4.22–5.81)
RDW: 17 % — ABNORMAL HIGH (ref 11.5–15.5)
WBC: 16.4 10*3/uL — ABNORMAL HIGH (ref 4.0–10.5)
nRBC: 0 % (ref 0.0–0.2)

## 2018-09-16 LAB — HEMOGLOBIN AND HEMATOCRIT, BLOOD
HCT: 26.5 % — ABNORMAL LOW (ref 39.0–52.0)
Hemoglobin: 8.4 g/dL — ABNORMAL LOW (ref 13.0–17.0)

## 2018-09-16 MED ORDER — POTASSIUM CHLORIDE CRYS ER 20 MEQ PO TBCR
20.0000 meq | EXTENDED_RELEASE_TABLET | Freq: Two times a day (BID) | ORAL | Status: DC
  Administered 2018-09-16 – 2018-09-21 (×11): 20 meq via ORAL
  Filled 2018-09-16 (×11): qty 1

## 2018-09-16 NOTE — Progress Notes (Signed)
[facility] NAME: Phillip [name]    MR#:  [id number]  DATE OF BIRTH:  1956/05/18  SUBJECTIVE:  CHIEF COMPLAINT:   Chief Complaint  Patient presents with  . Abdominal Pain   Patient still weak but reports is feeling much better today.  He was able to get out of the bed and sit at the side of the bed yesterday.  Denies any concerns.  Was able to tolerate food/ liquids.  No fever in last 24 hours.  Hemoglobin came up after blood transfusion.  Denies any active bleed.  REVIEW OF SYSTEMS:  Review of Systems  Constitutional: Negative for chills and fever.  HENT: Negative for sore throat.   Respiratory: Negative for cough, shortness of breath and wheezing.   Cardiovascular: Negative for chest pain and leg swelling.  Gastrointestinal: Negative for abdominal pain, diarrhea, nausea and vomiting.  Genitourinary: Negative for dysuria.  Musculoskeletal: Negative for back pain and myalgias.  Neurological: Negative for dizziness, sensory change, speech change, focal weakness and headaches.   DRUG ALLERGIES:  No Known Allergies  VITALS:  Blood pressure (!) 178/82, pulse 89, temperature 99.2 F (37.3 C), temperature source Oral, resp. rate 18, height 6\' 1"  (1.854 m), weight 110.2 kg, SpO2 95 %.  PHYSICAL EXAMINATION:   GENERAL:  62 y.o.-year-old patient lying in the bed with no acute distress.  EYES: Pupils equal, round, reactive to light and accommodation. No scleral icterus. Extraocular muscles intact.  HEENT: Head atraumatic, normocephalic. Oropharynx and nasopharynx clear.  NECK:  Supple, no jugular venous distention. No thyroid enlargement, no tenderness.  LUNGS: Normal breath sounds bilaterally, no wheezing, rales,rhonchi or crepitation. No use of accessory muscles of respiration.  CARDIOVASCULAR: S1, S2 normal. No murmurs, rubs, or gallops.  ABDOMEN: Soft,tender, some distended. Bowel sounds present. No organomegaly or mass.   EXTREMITIES: No pedal edema, cyanosis, or clubbing.  NEUROLOGIC: Cranial nerves II through XII are intact. Muscle strength 5/5 in all extremities. Sensation intact. Gait not checked.  PSYCHIATRIC: The patient is alert and oriented x 3.  SKIN: No obvious rash, lesion, or ulcer.   LABORATORY PANEL:   CBC Recent Labs  Lab 09/16/18 0449  WBC 16.4*  HGB 9.1*  HCT 29.1*  PLT 258   ------------------------------------------------------------------------------------------------------------------  Chemistries  Recent Labs  Lab 09/15/18 0249 09/16/18 0449  NA 143 142  K 3.2* 3.0*  CL 96* 90*  CO2 32 37*  GLUCOSE 130* 159*  BUN 87* 81*  CREATININE 5.36* 4.85*  CALCIUM 7.6* 7.9*  MG 2.1  --   AST  --  25  ALT  --  16  ALKPHOS  --  47  BILITOT  --  1.6*   ------------------------------------------------------------------------------------------------------------------  Cardiac Enzymes No results for input(s): TROPONINI in the last 168 hours. ------------------------------------------------------------------------------------------------------------------  RADIOLOGY:  Ct Abdomen Pelvis Wo Contrast  Result Date: 09/15/2018 CLINICAL DATA:  Fever.  Status post appendectomy. EXAM: CT ABDOMEN AND PELVIS WITHOUT CONTRAST TECHNIQUE: Multidetector CT imaging of the abdomen and pelvis was performed following the standard protocol without IV contrast. COMPARISON:  09/11/2018 FINDINGS: Lower chest: Bibasilar atelectasis.  Small right pleural effusion. Hepatobiliary: Multiple gallstones.  Unremarkable liver. Pancreas: Unremarkable Spleen: Unremarkable Adrenals/Urinary Tract: Kidneys and adrenal glands are within normal limits. Foley catheter is positioned in the bladder. Stomach/Bowel: Stomach is distended with contrast. Dilated proximal small bowel loops are stable. Distal small bowel loops are decompressed. Overall small bowel distension has slightly improved. Colon is also decompressed.  Vascular/Lymphatic:  Mild aortic and iliac artery vascular calcifications. No abnormal retroperitoneal adenopathy. Reproductive: Prostate is small. Other: Hyperdense free fluid within the lower abdomen and pelvis is not significantly changed. Small amount of hyperdense fluid about the liver is stable. Musculoskeletal: Bilateral L5 pars defects and grade 1 spondylolisthesis. No vertebral compression deformity. IMPRESSION: Dilated proximal small bowel loops have slightly improved. Distal small bowel loops in colon remain decompressed. This suggest an improving small bowel obstruction pattern. Stable hyperdense fluid within the lower abdomen and pelvis most consistent with intraperitoneal hemorrhage. Electronically Signed   By: Marybelle Killings M.D.   On: 09/15/2018 07:54   Dg Chest 1 View  Result Date: 09/15/2018 CLINICAL DATA:  Hypoxia EXAM: CHEST  1 VIEW COMPARISON:  09/14/2018 FINDINGS: Normal heart size. Postoperative changes from CABG. Low volumes and bibasilar atelectasis. No pneumothorax. No pleural effusion. IMPRESSION: Bibasilar atelectasis and low lung volumes. Electronically Signed   By: Marybelle Killings M.D.   On: 09/15/2018 09:02   Dg Chest Port 1 View  Result Date: 09/14/2018 CLINICAL DATA:  Fevers EXAM: PORTABLE CHEST 1 VIEW COMPARISON:  03/25/2018 FINDINGS: Cardiac shadow is prominent but stable. Postsurgical changes are again noted. The overall inspiratory effort is poor. Mild increased vascular congestion noted without significant edema. Bibasilar atelectatic changes are noted. No bony abnormality is noted. IMPRESSION: Mild bibasilar atelectasis. Mild vascular congestion. Electronically Signed   By: Inez Catalina M.D.   On: 09/14/2018 21:59    EKG:  No orders found for this or any previous visit.  ASSESSMENT AND PLAN:   63 year old male presenting with  right lower quadrant pain, nausea, fever of 102.5 and chills.CT abdomen and pelvis showed evidence of dilated appendix with appendiceal  inflammation consistent with acute appendicitis.  1. Acute renal failure on chronic kidney disease stage III with proteinuria: baseline creatinine of 2.23, GFR of 35 on 08/10/2018.  Likely secondary to ATN from IV contrast nephropathy, acute appendicitis -Nephrology following -Holding ACE inhibitors, diuretics and nephrotoxins -IVFs hydration with sodium bicarb.  May stop IV fluids today. -Per nephrology no indication for dialysis at this time as have good urine out put.  2. Hypertension:  BP 157/78 this morning. Allowing permissive hypertension per nephrology -Continue hydralazine, carvedilol and isosorbide mononitrate.  3. Insulin-dependent diabetes mellitus with complications: Not well controlled, was on glipizide and Levemir at home -hemoglobin A1c high. -on sliding scale and long-acting -Diabetes coordinator had seen for blood sugar management- added lantus and meal time coverage.  4.  Anemia of chronic disease: Hemoglobin 10.3 status post PRBC transfusion on 4/10 improved at 8.3- again dropped < 8, transfuse 1 unit again 09/15/18.  5.  Acute appendicitis: s/p laparoscopic appendectomyfor acute appendicitis. Post OP #5 - Management per surgery and GI.  6.   Chronic diastolic congestive Heart Failure: Mild cardiomyopathy. Last Echo 2013 -On carvedilol and isosorbide mononitrate -Check Daily weights, close monitoring of I&O -Holding diuretics -Recommend Low salt diet  -need outpatient follow up with cardiology -Reviewed echocardiogram, ejection fraction 50 to 55% with concentric hypertrophy.  6. GERD: On Protonix for GI prophylaxis with   7.  DVT prophylaxis - Holding anticoagulation and antiplatelets given elevated creatinine and history of bleed?.  He is ambulatory now/SCDs.  8. Fever- blood cx sent. CT abd have some intraperitoneal hemorrhage- this could be reason, monitor per surgical team. Blood cultures are negative so far.  All the records are reviewed and case  discussed with Care Management/Social Workerr. Management plans discussed with the patient, family and they are in  agreement.  CODE STATUS: FULL  TOTAL TIME TAKING CARE OF THIS PATIENT: 36 minutes.   POSSIBLE D/C IN 1-2 DAYS, DEPENDING ON CLINICAL CONDITION.   Collene Mares   09/16/2018 at 4:58 PM  Between 7am to 6pm - Pager - [phone]  After 6pm go to [email or web address] - password [email or web address] [facility]  Office  [phone]  CC: Primary care physician; Cyndi Bender, PA-C

## 2018-09-16 NOTE — Progress Notes (Signed)
Physical Therapy Treatment Patient Details Name: Phillip [name] MRN: [id number] DOB: 02-Sep-1955 Today's Date: 09/16/2018    History of Present Illness Phillip [name] is a 63yo male who comes to [facility] on 4/7 c 2D ABD pain in RLQ, taken to surgery 4/7 for laproscopic appendectomy. PMH: CHF, PAD, chronic renal insufficicency, CAD s/p CABG, PVD s/p hallux amputation. Pt had ABLA and transfused PRBC on 4/10.     PT Comments    Pt received up in chair, agreeable to participate. Pt able to use RW this session for the first time, appears more stable, now AMB without gait instability. Pt able to double his AMB distance and AMB speed from the previous day. Most difficulty this date is rising from a standard chair height surface. Transfers training and STS exercises commenced from elevated surface with intent to decrease load, and allow for increased repetition for improved establishment of balance and strengthening. Both sets performed with supervision. Pt declines back to chair at EOS, agreeable to sit at EOB.     Follow Up Recommendations  Home health PT     Equipment Recommendations  Rolling walker with 5" wheels    Recommendations for Other Services       Precautions / Restrictions Precautions Precautions: Fall Restrictions Weight Bearing Restrictions: No    Mobility  Bed Mobility               General bed mobility comments: received up in chair  Transfers Overall transfer level: Needs assistance Equipment used: Rolling walker (2 wheeled) Transfers: Sit to/from Stand           General transfer comment: heavy effort, VC for use of chair arms to push up; Then performs 2x5 STS from elevated EOB   Ambulation/Gait Ambulation/Gait assistance: Min guard Gait Distance (Feet): 120 Feet Assistive device: Rolling walker (2 wheeled)   Gait velocity: 0.68m/s  Gait velocity interpretation: <1.31 ft/sec, indicative of household ambulator General Gait Details: PT manages IV  pole. Pt pauses standing halfway to rest arms/legs.    Stairs             Wheelchair Mobility    Modified Rankin (Stroke Patients Only)       Balance Overall balance assessment: Modified Independent;No apparent balance deficits (not formally assessed)                                          Cognition Arousal/Alertness: Awake/alert Behavior During Therapy: WFL for tasks assessed/performed Overall Cognitive Status: Within Functional Limits for tasks assessed                                        Exercises Other Exercises Other Exercises: STS from elevated EOB; 2x5 c RW     General Comments        Pertinent Vitals/Pain Pain Assessment: No/denies pain    Home Living                      Prior Function            PT Goals (current goals can now be found in the care plan section) Acute Rehab PT Goals Patient Stated Goal: regain strength and independent AMB  PT Goal Formulation: With patient Time For Goal Achievement: 09/29/18 Potential to Achieve Goals: Good  Progress towards PT goals: Progressing toward goals    Frequency    Min 2X/week      PT Plan Current plan remains appropriate    Co-evaluation              AM-PAC PT "6 Clicks" Mobility   Outcome Measure  Help needed turning from your back to your side while in a flat bed without using bedrails?: None Help needed moving from lying on your back to sitting on the side of a flat bed without using bedrails?: None Help needed moving to and from a bed to a chair (including a wheelchair)?: A Little Help needed standing up from a chair using your arms (e.g., wheelchair or bedside chair)?: A Little Help needed to walk in hospital room?: A Little Help needed climbing 3-5 steps with a railing? : A Little 6 Click Score: 20    End of Session Equipment Utilized During Treatment: Gait belt Activity Tolerance: Patient tolerated treatment well;Patient  limited by fatigue Patient left: with nursing/sitter in room(seated EOB, RN passing meds.) Nurse Communication: Mobility status;Precautions PT Visit Diagnosis: Unsteadiness on feet (R26.81);Other abnormalities of gait and mobility (R26.89);Muscle weakness (generalized) (M62.81);Difficulty in walking, not elsewhere classified (R26.2)     Time: 1109-1130 PT Time Calculation (min) (ACUTE ONLY): 21 min  Charges:  $Therapeutic Exercise: 8-22 mins                     11:49 AM, 09/16/18 Etta Grandchild, PT, DPT Physical Therapist - [facility]  [phone] ([place])     , C 09/16/2018, 11:45 AM

## 2018-09-16 NOTE — Progress Notes (Addendum)
[facility] Hospital Day(s): 6.   Post op day(s): 8 Days Post-Op.   Interval History: Patient seen and examined, no acute events or new complaints overnight. Patient reports that he is feeling better an "wants to go home." He denied any abdominal pain, fever, chills, nausea, or emesis. He has tolerated a renal diet without issue. Continues ot have bowel movents which are loose. He did have an increase in his WBC but afebrile. Did receive 1 unit pRBCs yesterday. Renal function slowly improved with U/O at 1.85 L yesterday,   Review of Systems:  Constitutional: denies fever, chills  Respiratory: denies any shortness of breath  Cardiovascular: denies chest pain or palpitations  Gastrointestinal: denies abdominal pain, N/V, + diarrhea Integumentary: denies any other rashes or skin discolorations except laparoscopic incisions   Vital signs in last 24 hours: [min-max] current  Temp:  [98.7 F (37.1 C)-100.2 F (37.9 C)] 98.7 F (37.1 C) (04/15 1143) Pulse Rate:  [45-112] 89 (04/15 1144) Resp:  [16-24] 18 (04/15 1143) BP: (143-180)/(69-84) 178/82 (04/15 1144) SpO2:  [91 %-95 %] 95 % (04/15 1143)     Height: 6\' 1"  (185.4 cm) Weight: 110.2 kg BMI (Calculated): 32.07   Intake/Output this shift:  Total I/O In: 240 [P.O.:240] Out: -     Physical Exam:  Constitutional: alert, cooperative and no distress  Respiratory: breathing non-labored at rest, on 1L   Cardiovascular: regular rate and sinus rhythm  Gastrointestinal: soft, non-tender, and non-distended Integumentary: Laparoscopic incisions are CDI, no erythema or drainage. Significant ecchymosis periumbilically and suprapubically   Labs:  CBC Latest Ref Rng & Units 09/16/2018 09/16/2018 09/15/2018  WBC 4.0 - 10.5 K/uL 16.4(H) - 13.9(H)  Hemoglobin 13.0 - 17.0 g/dL 9.1(L) 8.4(L) 7.8(L)  Hematocrit 39.0 - 52.0 % 29.1(L) 26.5(L) 25.1(L)  Platelets 150 - 400 K/uL 258 - 183   CMP Latest Ref Rng  & Units 09/16/2018 09/15/2018 09/14/2018  Glucose 70 - 99 mg/dL 159(H) 130(H) 126(H)  BUN 8 - 23 mg/dL 81(H) 87(H) 85(H)  Creatinine 0.61 - 1.24 mg/dL 4.85(H) 5.36(H) 5.32(H)  Sodium 135 - 145 mmol/L 142 143 141  Potassium 3.5 - 5.1 mmol/L 3.0(L) 3.2(L) 3.4(L)  Chloride 98 - 111 mmol/L 90(L) 96(L) 95(L)  CO2 22 - 32 mmol/L 37(H) 32 31  Calcium 8.9 - 10.3 mg/dL 7.9(L) 7.6(L) 7.7(L)  Total Protein 6.5 - 8.1 g/dL 6.9 - -  Total Bilirubin 0.3 - 1.2 mg/dL 1.6(H) - -  Alkaline Phos 38 - 126 U/L 47 - -  AST 15 - 41 U/L 25 - -  ALT 0 - 44 U/L 16 - -     Imaging studies: No new pertinent imaging studies   Assessment/Plan: (ICD-10's: K35.80) 63 y.o. male with worsening leukocytosis but improved renal function and improving abdominal pain 8 Days Post-Op s/p laparoscopic appendectomy for acute appendicitis.    - Continue renal diet  - Continue IV ABx (Zosyn); renal dosing  - Will discontinue IVF today; d/w nephrology if sCr worsens will need to restart   - will check stool for c diff given persistent diarrhea  - If worsening WBC or develops signs of sepsis will consider IR evaluation for possible percutaneous drainage of fluid collection in RLQ. This is thought to be hematoma however given worsening leukocytosis would be concerned for abscess vs infected hematoma  - Monitor abdominal exam; on-going bowel function             - Monitor hgb; 1 unit pRBCs (2  total this admission)  - Monitor renal function; morning BMP  - Replete K+ and Monitor   - Appreciate nephrology involvement; no indication for HD at this time - Appreciate medicine'shelp with comorbidities  All of the above findings and recommendations were discussed with the patient, and the medical team, and all of patient's questions were answered to his expressed satisfaction.  -- Phillip Simon, PA-C [facility] 09/16/2018, 11:59 AM [phone] M-F: 7am - 4pm

## 2018-09-16 NOTE — Progress Notes (Signed)
[facility]  ROUNDING NOTE   Subjective:  Patient seen at bedside. Feeling better today. Urine output 1.8 L over the preceding 24 hours. IV fluids to be stopped today. Creatinine currently 4.85.   Objective:  Vital signs in last 24 hours:  Temp:  [98.7 F (37.1 C)-100.2 F (37.9 C)] 98.9 F (37.2 C) (04/15 0828) Pulse Rate:  [45-112] 109 (04/15 1124) Resp:  [16-24] 18 (04/15 0631) BP: (143-181)/(69-80) 158/71 (04/15 0828) SpO2:  [91 %-97 %] 94 % (04/15 0828)  Weight change:  Filed Weights   09/08/18 1239  Weight: 110.2 kg    Intake/Output: I/O last 3 completed shifts: In: 5929.9 [P.O.:300; I.V.:5251.9; Blood:160; IV Piggyback:218] Out: 1850 [Urine:1850]   Intake/Output this shift:  Total I/O In: 240 [P.O.:240] Out: -   Physical Exam: General: NAD  Head: Normocephalic, atraumatic. Moist oral mucosal membranes  Eyes: Anicteric  Neck: Supple, trachea midline  Lungs:  Clear to auscultation  Heart: Regular rate and rhythm  Abdomen:  Soft, nontender  Extremities: 1+ peripheral edema.  Neurologic:  Awake, alert  Skin: No lesions  Access: none    Basic Metabolic Panel: Recent Labs  Lab 09/11/18 0300 09/12/18 0526 09/13/18 0522 09/14/18 0302 09/14/18 2101 09/15/18 0249 09/16/18 0449  NA 141 141 141 143 141 143 142  K 4.9 4.4 3.8 3.4* 3.4* 3.2* 3.0*  CL 106 100 99 97* 95* 96* 90*  CO2 22 25 27 31 31  32 37*  GLUCOSE 167* 161* 178* 131* 126* 130* 159*  BUN 90* 106* 109* 96* 85* 87* 81*  CREATININE 7.02* 7.93* 7.45* 6.08* 5.32* 5.36* 4.85*  CALCIUM 7.8* 7.8* 7.5* 7.7* 7.7* 7.6* 7.9*  MG 2.2  --   --   --   --  2.1  --   PHOS 7.0* 7.4* 6.9* 5.6*  --  5.2*  --     Liver Function Tests: Recent Labs  Lab 09/12/18 0526 09/16/18 0449  AST 25 25  ALT 31 16  ALKPHOS 43 47  BILITOT 1.0 1.6*  PROT 7.0 6.9  ALBUMIN 3.1* 2.9*   No results for input(s): LIPASE, AMYLASE in the last 168 hours. No results for input(s): AMMONIA in the last 168  hours.  CBC: Recent Labs  Lab 09/12/18 0526 09/14/18 0302 09/14/18 2101 09/15/18 0249 09/16/18 0017 09/16/18 0449  WBC 16.1* 12.6* 15.0* 13.9*  --  16.4*  NEUTROABS  --   --   --  10.5*  --   --   HGB 8.3* 8.5* 8.3* 7.8* 8.4* 9.1*  HCT 26.2* 26.8* 26.0* 25.1* 26.5* 29.1*  MCV 87.6 87.3 88.4 89.0  --  88.2  PLT 170 184 218 183  --  258    Cardiac Enzymes: No results for input(s): CKTOTAL, CKMB, CKMBINDEX, TROPONINI in the last 168 hours.  BNP: Invalid input(s): POCBNP  CBG: Recent Labs  Lab 09/15/18 0754 09/15/18 1142 09/15/18 1641 09/15/18 2135 09/16/18 0733  GLUCAP 142* 145* 166* 179* 147*    Microbiology: Results for orders placed or performed during the hospital encounter of 09/08/18  Culture, blood (routine x 2)     Status: None (Preliminary result)   Collection Time: 09/14/18  9:02 PM  Result Value Ref Range Status   Specimen Description BLOOD BLOOD RIGHT HAND  Final   Special Requests   Final    BOTTLES DRAWN AEROBIC AND ANAEROBIC Blood Culture adequate volume   Culture   Final    NO GROWTH 2 DAYS Performed at [facility], [address] [place], [place] [address]    Report Status PENDING  Incomplete  Culture, blood (routine x 2)     Status: None (Preliminary result)   Collection Time: 09/14/18  9:09 PM  Result Value Ref Range Status   Specimen Description BLOOD RIGHT ANTECUBITAL  Final   Special Requests   Final    BOTTLES DRAWN AEROBIC AND ANAEROBIC Blood Culture adequate volume   Culture   Final    NO GROWTH 2 DAYS Performed at [facility], [address] [place], [place] [address]    Report Status PENDING  Incomplete    Coagulation Studies: No results for input(s): LABPROT, INR in the last 72 hours.  Urinalysis: Recent Labs    09/14/18 2019  COLORURINE YELLOW*  LABSPEC 1.018  PHURINE 5.0  GLUCOSEU NEGATIVE  HGBUR MODERATE*  BILIRUBINUR NEGATIVE  KETONESUR NEGATIVE  PROTEINUR 100*  NITRITE NEGATIVE   LEUKOCYTESUR NEGATIVE      Imaging: Ct Abdomen Pelvis Wo Contrast  Result Date: 09/15/2018 CLINICAL DATA:  Fever.  Status post appendectomy. EXAM: CT ABDOMEN AND PELVIS WITHOUT CONTRAST TECHNIQUE: Multidetector CT imaging of the abdomen and pelvis was performed following the standard protocol without IV contrast. COMPARISON:  09/11/2018 FINDINGS: Lower chest: Bibasilar atelectasis.  Small right pleural effusion. Hepatobiliary: Multiple gallstones.  Unremarkable liver. Pancreas: Unremarkable Spleen: Unremarkable Adrenals/Urinary Tract: Kidneys and adrenal glands are within normal limits. Foley catheter is positioned in the bladder. Stomach/Bowel: Stomach is distended with contrast. Dilated proximal small bowel loops are stable. Distal small bowel loops are decompressed. Overall small bowel distension has slightly improved. Colon is also decompressed. Vascular/Lymphatic: Mild aortic and iliac artery vascular calcifications. No abnormal retroperitoneal adenopathy. Reproductive: Prostate is small. Other: Hyperdense free fluid within the lower abdomen and pelvis is not significantly changed. Small amount of hyperdense fluid about the liver is stable. Musculoskeletal: Bilateral L5 pars defects and grade 1 spondylolisthesis. No vertebral compression deformity. IMPRESSION: Dilated proximal small bowel loops have slightly improved. Distal small bowel loops in colon remain decompressed. This suggest an improving small bowel obstruction pattern. Stable hyperdense fluid within the lower abdomen and pelvis most consistent with intraperitoneal hemorrhage. Electronically Signed   By: Marybelle Killings M.D.   On: 09/15/2018 07:54   Dg Chest 1 View  Result Date: 09/15/2018 CLINICAL DATA:  Hypoxia EXAM: CHEST  1 VIEW COMPARISON:  09/14/2018 FINDINGS: Normal heart size. Postoperative changes from CABG. Low volumes and bibasilar atelectasis. No pneumothorax. No pleural effusion. IMPRESSION: Bibasilar atelectasis and low lung  volumes. Electronically Signed   By: Marybelle Killings M.D.   On: 09/15/2018 09:02   Dg Chest Port 1 View  Result Date: 09/14/2018 CLINICAL DATA:  Fevers EXAM: PORTABLE CHEST 1 VIEW COMPARISON:  03/25/2018 FINDINGS: Cardiac shadow is prominent but stable. Postsurgical changes are again noted. The overall inspiratory effort is poor. Mild increased vascular congestion noted without significant edema. Bibasilar atelectatic changes are noted. No bony abnormality is noted. IMPRESSION: Mild bibasilar atelectasis. Mild vascular congestion. Electronically Signed   By: Inez Catalina M.D.   On: 09/14/2018 21:59     Medications:   . sodium chloride 250 mL (09/16/18 1130)  . piperacillin-tazobactam (ZOSYN)  IV 3.375 g (09/16/18 1131)   . sodium chloride   Intravenous Once  . acetaminophen  1,000 mg Oral Q6H  . carvedilol  6.25 mg Oral BID WC  . insulin aspart  0-15 Units Subcutaneous TID WC  . insulin aspart  0-5 Units Subcutaneous QHS  . insulin aspart  4  Units Subcutaneous TID WC  . insulin glargine  10 Units Subcutaneous BID  . isosorbide mononitrate  120 mg Oral Daily  . pantoprazole  40 mg Oral QHS  . polyethylene glycol  17 g Oral Daily  . potassium chloride  20 mEq Oral BID   sodium chloride, dextrose, hydrALAZINE, HYDROmorphone (DILAUDID) injection, loperamide, nitroGLYCERIN, ondansetron **OR** ondansetron (ZOFRAN) IV, oxyCODONE, prochlorperazine **OR** prochlorperazine, traMADol  Assessment/ Plan:  [name] is a 63 y.o. black male with diabetes mellitus type II, diabetic neuropathy, hypertension, coronary artery disease status post CABG, peripheral vascular disease, status post left toe amputation, congestive heart failure, asthma, who was admitted to [facility] on 09/08/2018 for Acute appendicitis   1. Acute renal failure on chronic kidney disease stage III with proteinuria: baseline creatinine of 2.23, GFR of 35 on 08/10/2018.  Chronic Kidney Disease secondary to diabetic  nephropathy Acute renal failure secondary to ATN from IV contrast nephropathy (4/7) and possibly from acute illness, acute appendicitis.  No indication for dialysis. Nonoliguric.  - Holding lisinopril, furosemide, spironolactone, potassium chloride, and metolazone.  -We will stop IV fluid hydration at this time.  BUN currently 81 with a creatinine that slightly down to 4.85.  If creatinine starts rising we may need to consider restarting IV fluids.  2. Hypertension: Continue carvedilol and isosorbide mononitrate.  3. Diabetes mellitus type II with chronic kidney disease: insulin dependent. not well controlled. Hemoglobin A1c of 9.1% on 01/14/18  4. Anemia with renal failure: Hemoglobin improved to 9.1 post blood transfusion.  Continue to monitor CBC.   LOS: 6 Kieth Hartis 4/15/202011:36 AM

## 2018-09-16 NOTE — Progress Notes (Signed)
Spoke to patient's wife Hilda Blades and she requested if the MD could give her a call for medical updates. Patient now resting quietly in bed.

## 2018-09-17 LAB — BASIC METABOLIC PANEL
Anion gap: 15 (ref 5–15)
BUN: 77 mg/dL — ABNORMAL HIGH (ref 8–23)
CO2: 35 mmol/L — ABNORMAL HIGH (ref 22–32)
Calcium: 7.6 mg/dL — ABNORMAL LOW (ref 8.9–10.3)
Chloride: 93 mmol/L — ABNORMAL LOW (ref 98–111)
Creatinine, Ser: 4.87 mg/dL — ABNORMAL HIGH (ref 0.61–1.24)
GFR calc Af Amer: 14 mL/min — ABNORMAL LOW (ref >60)
GFR calc non Af Amer: 12 mL/min — ABNORMAL LOW (ref >60)
Glucose, Bld: 84 mg/dL (ref 70–99)
Potassium: 3.2 mmol/L — ABNORMAL LOW (ref 3.5–5.1)
Sodium: 143 mmol/L (ref 135–145)

## 2018-09-17 LAB — CBC
HCT: 28.2 % — ABNORMAL LOW (ref 39.0–52.0)
Hemoglobin: 8.7 g/dL — ABNORMAL LOW (ref 13.0–17.0)
MCH: 27.6 pg (ref 26.0–34.0)
MCHC: 30.9 g/dL (ref 30.0–36.0)
MCV: 89.5 fL (ref 80.0–100.0)
Platelets: 273 10*3/uL (ref 150–400)
RBC: 3.15 MIL/uL — ABNORMAL LOW (ref 4.22–5.81)
RDW: 17 % — ABNORMAL HIGH (ref 11.5–15.5)
WBC: 15.6 10*3/uL — ABNORMAL HIGH (ref 4.0–10.5)
nRBC: 0 % (ref 0.0–0.2)

## 2018-09-17 LAB — GLUCOSE, CAPILLARY
Glucose-Capillary: 82 mg/dL (ref 70–99)
Glucose-Capillary: 90 mg/dL (ref 70–99)
Glucose-Capillary: 159 mg/dL — ABNORMAL HIGH (ref 70–99)
Glucose-Capillary: 133 mg/dL — ABNORMAL HIGH (ref 70–99)

## 2018-09-17 MED ORDER — INSULIN GLARGINE 100 UNIT/ML ~~LOC~~ SOLN
10.0000 [IU] | Freq: Two times a day (BID) | SUBCUTANEOUS | Status: DC
  Administered 2018-09-17 – 2018-09-21 (×8): 10 [IU] via SUBCUTANEOUS
  Filled 2018-09-17 (×11): qty 0.1

## 2018-09-17 NOTE — Progress Notes (Signed)
Physical Therapy Treatment Patient Details Name: Phillip [name] MRN: [id number] DOB: November 27, 1955 Today's Date: 09/17/2018    History of Present Illness Phillip [name] is a 63yo male who comes to [facility] on 4/7 c 2D ABD pain in RLQ, taken to surgery 4/7 for laproscopic appendectomy. PMH: CHF, PAD, chronic renal insufficicency, CAD s/p CABG, PVD s/p hallux amputation. Pt had ABLA and transfused PRBC on 4/10.     PT Comments    Pt received up in chair, reports he's been up since early this morning. Pt agreeable to participate, appears more alert/awake than previous day. Pt reports decreased ABD pain this date. Pt AMB around the unit, 152ft, with gait speed > twice as fast as previous day. Pt then performs 3 steps with 2 railings to simulate entry of home at [place]. Pt performs 2 SPT without assistive device, able to take a few steps with no frank gait instability which is an improvement over 2DA. Pt progressing gradually but consistently, pt pleased with his progress.     Follow Up Recommendations  Home health PT     Equipment Recommendations  Rolling walker with 5" wheels    Recommendations for Other Services       Precautions / Restrictions Precautions Precautions: Fall Restrictions Weight Bearing Restrictions: No    Mobility  Bed Mobility Overal bed mobility: Needs Assistance Bed Mobility: Sit to Supine       Sit to supine: Supervision      Transfers Overall transfer level: Needs assistance Equipment used: Rolling walker (2 wheeled) Transfers: Sit to/from [facility] Sit to Stand: Min assist Stand pivot transfers: Supervision(familiar technique with BUE on chair arms)       General transfer comment: minA from chair.   Ambulation/Gait Ambulation/Gait assistance: Supervision Gait Distance (Feet): 190 Feet Assistive device: Rolling walker (2 wheeled)   Gait velocity: 0.32m/s this date; (0.38m/s 4/15) Gait velocity interpretation: 1.31 - 2.62 ft/sec,  indicative of limited community ambulator General Gait Details: No stop for resting, pt AMB straight through, HR 112bpm   Stairs Stairs: Yes Stairs assistance: Min guard Stair Management: Two rails Number of Stairs: 3     Wheelchair Mobility    Modified Rankin (Stroke Patients Only)       Balance Overall balance assessment: Modified Independent;Mild deficits observed, not formally tested                                          Cognition Arousal/Alertness: Awake/alert Behavior During Therapy: WFL for tasks assessed/performed Overall Cognitive Status: Within Functional Limits for tasks assessed                                        Exercises General Exercises - Lower Extremity Long Arc Quad: AROM;Both;15 reps;Seated    General Comments        Pertinent Vitals/Pain Pain Assessment: No/denies pain(mild lower abd discomfort per patient, not as bad as yesterday )    Home Living                      Prior Function            PT Goals (current goals can now be found in the care plan section) Acute Rehab PT Goals Patient Stated Goal: regain strength and independent AMB  PT Goal Formulation: With patient Time For Goal Achievement: 09/29/18 Potential to Achieve Goals: Good Progress towards PT goals: Progressing toward goals    Frequency    Min 2X/week      PT Plan Current plan remains appropriate    Co-evaluation              AM-PAC PT "6 Clicks" Mobility   Outcome Measure  Help needed turning from your back to your side while in a flat bed without using bedrails?: None Help needed moving from lying on your back to sitting on the side of a flat bed without using bedrails?: None Help needed moving to and from a bed to a chair (including a wheelchair)?: A Little Help needed standing up from a chair using your arms (e.g., wheelchair or bedside chair)?: A Little Help needed to walk in hospital room?: A  Little Help needed climbing 3-5 steps with a railing? : A Little 6 Click Score: 20    End of Session Equipment Utilized During Treatment: Gait belt Activity Tolerance: Patient tolerated treatment well;Patient limited by fatigue Patient left: in bed;with bed alarm set;with call bell/phone within reach Nurse Communication: Mobility status;Precautions PT Visit Diagnosis: Unsteadiness on feet (R26.81);Other abnormalities of gait and mobility (R26.89);Muscle weakness (generalized) (M62.81);Difficulty in walking, not elsewhere classified (R26.2)     Time: 0762-2633 PT Time Calculation (min) (ACUTE ONLY): 29 min  Charges:  $Therapeutic Exercise: 23-37 mins                    3:31 PM, 09/17/18 Etta Grandchild, PT, DPT Physical Therapist - [facility]  [phone] ([place])     [place] 09/17/2018, 3:28 PM

## 2018-09-17 NOTE — Progress Notes (Signed)
[facility]  ROUNDING NOTE   Subjective:  Patient sitting up in chair. BUN currently 77 with a creatinine of 4.87. Urine output was 1.3 L over the preceding 24 hours.   Objective:  Vital signs in last 24 hours:  Temp:  [98.4 F (36.9 C)-99.7 F (37.6 C)] 98.4 F (36.9 C) (04/16 1132) Pulse Rate:  [80-91] 88 (04/16 1132) Resp:  [18] 18 (04/16 1132) BP: (148-178)/(73-87) 178/87 (04/16 1132) SpO2:  [88 %-98 %] 98 % (04/16 1132)  Weight change:  Filed Weights   09/08/18 1239  Weight: 110.2 kg    Intake/Output: I/O last 3 completed shifts: In: 6109.2 [P.O.:540; I.V.:5191.2; Blood:160; IV Piggyback:217.9] Out: 1875 [NUUVO:5366]   Intake/Output this shift:  Total I/O In: 50 [IV Piggyback:50] Out: -   Physical Exam: General: NAD  Head: Normocephalic, atraumatic. Moist oral mucosal membranes  Eyes: Anicteric  Neck: Supple, trachea midline  Lungs:  Clear to auscultation  Heart: Regular rate and rhythm  Abdomen:  Soft, nontender  Extremities: 1+ peripheral edema.  Neurologic:  Awake, alert  Skin: No lesions  Access: none    Basic Metabolic Panel: Recent Labs  Lab 09/11/18 0300 09/12/18 0526 09/13/18 0522 09/14/18 0302 09/14/18 2101 09/15/18 0249 09/16/18 0449 09/17/18 0355  NA 141 141 141 143 141 143 142 143  K 4.9 4.4 3.8 3.4* 3.4* 3.2* 3.0* 3.2*  CL 106 100 99 97* 95* 96* 90* 93*  CO2 22 25 27 31 31  32 37* 35*  GLUCOSE 167* 161* 178* 131* 126* 130* 159* 84  BUN 90* 106* 109* 96* 85* 87* 81* 77*  CREATININE 7.02* 7.93* 7.45* 6.08* 5.32* 5.36* 4.85* 4.87*  CALCIUM 7.8* 7.8* 7.5* 7.7* 7.7* 7.6* 7.9* 7.6*  MG 2.2  --   --   --   --  2.1  --   --   PHOS 7.0* 7.4* 6.9* 5.6*  --  5.2*  --   --     Liver Function Tests: Recent Labs  Lab 09/12/18 0526 09/16/18 0449  AST 25 25  ALT 31 16  ALKPHOS 43 47  BILITOT 1.0 1.6*  PROT 7.0 6.9  ALBUMIN 3.1* 2.9*   No results for input(s): LIPASE, AMYLASE in the last 168 hours. No results for  input(s): AMMONIA in the last 168 hours.  CBC: Recent Labs  Lab 09/14/18 0302 09/14/18 2101 09/15/18 0249 09/16/18 0017 09/16/18 0449 09/17/18 0355  WBC 12.6* 15.0* 13.9*  --  16.4* 15.6*  NEUTROABS  --   --  10.5*  --   --   --   HGB 8.5* 8.3* 7.8* 8.4* 9.1* 8.7*  HCT 26.8* 26.0* 25.1* 26.5* 29.1* 28.2*  MCV 87.3 88.4 89.0  --  88.2 89.5  PLT 184 218 183  --  258 273    Cardiac Enzymes: No results for input(s): CKTOTAL, CKMB, CKMBINDEX, TROPONINI in the last 168 hours.  BNP: Invalid input(s): POCBNP  CBG: Recent Labs  Lab 09/16/18 1140 09/16/18 1635 09/16/18 2117 09/17/18 0751 09/17/18 1131  GLUCAP 142* 88 85 82 90    Microbiology: Results for orders placed or performed during the hospital encounter of 09/08/18  Culture, blood (routine x 2)     Status: None (Preliminary result)   Collection Time: 09/14/18  9:02 PM  Result Value Ref Range Status   Specimen Description BLOOD BLOOD RIGHT HAND  Final   Special Requests   Final    BOTTLES DRAWN AEROBIC AND ANAEROBIC Blood Culture adequate volume   Culture  Final    NO GROWTH 3 DAYS Performed at [facility], [address] [place], [place] [address]    Report Status PENDING  Incomplete  Culture, blood (routine x 2)     Status: None (Preliminary result)   Collection Time: 09/14/18  9:09 PM  Result Value Ref Range Status   Specimen Description BLOOD RIGHT ANTECUBITAL  Final   Special Requests   Final    BOTTLES DRAWN AEROBIC AND ANAEROBIC Blood Culture adequate volume   Culture   Final    NO GROWTH 3 DAYS Performed at [facility], [address] [place], [place] [address]    Report Status PENDING  Incomplete  C difficile quick scan w PCR reflex     Status: None   Collection Time: 09/16/18  6:39 PM  Result Value Ref Range Status   C Diff antigen NEGATIVE NEGATIVE Final   C Diff toxin NEGATIVE NEGATIVE Final   C Diff interpretation No C. difficile detected.  Final    Comment: Performed  at [facility], [address] [place], [place] [address]    Coagulation Studies: No results for input(s): LABPROT, INR in the last 72 hours.  Urinalysis: Recent Labs    09/14/18 2019  COLORURINE YELLOW*  LABSPEC 1.018  PHURINE 5.0  GLUCOSEU NEGATIVE  HGBUR MODERATE*  BILIRUBINUR NEGATIVE  KETONESUR NEGATIVE  PROTEINUR 100*  NITRITE NEGATIVE  LEUKOCYTESUR NEGATIVE      Imaging: No results found.   Medications:   . sodium chloride Stopped (09/16/18 1131)  . piperacillin-tazobactam (ZOSYN)  IV 3.375 g (09/17/18 0917)   . sodium chloride   Intravenous Once  . acetaminophen  1,000 mg Oral Q6H  . carvedilol  6.25 mg Oral BID WC  . insulin aspart  0-15 Units Subcutaneous TID WC  . insulin aspart  0-5 Units Subcutaneous QHS  . insulin aspart  4 Units Subcutaneous TID WC  . insulin glargine  10 Units Subcutaneous BID  . isosorbide mononitrate  120 mg Oral Daily  . pantoprazole  40 mg Oral QHS  . polyethylene glycol  17 g Oral Daily  . potassium chloride  20 mEq Oral BID   sodium chloride, dextrose, hydrALAZINE, HYDROmorphone (DILAUDID) injection, loperamide, nitroGLYCERIN, ondansetron **OR** ondansetron (ZOFRAN) IV, oxyCODONE, prochlorperazine **OR** prochlorperazine, traMADol  Assessment/ Plan:  Phillip [name] is a 63 y.o. black male with diabetes mellitus type II, diabetic neuropathy, hypertension, coronary artery disease status post CABG, peripheral vascular disease, status post left toe amputation, congestive heart failure, asthma, who was admitted to [facility] on 09/08/2018 for Acute appendicitis   1. Acute renal failure on chronic kidney disease stage III with proteinuria: baseline creatinine of 2.23, GFR of 35 on 08/10/2018.  Chronic Kidney Disease secondary to diabetic nephropathy Acute renal failure secondary to ATN from IV contrast nephropathy (4/7) and possibly from acute illness, acute appendicitis.  No indication for dialysis. Nonoliguric.  -  Holding lisinopril, furosemide, spironolactone, potassium chloride, and metolazone.  -Creatinine appears to have plateaued at 4.8.  BUN down slightly to 77.  Encourage patient to increase fluid intake.  No urgent indication for dialysis as the patient is still making urine.  2. Hypertension: Continue carvedilol and isosorbide mononitrate.  3. Diabetes mellitus type II with chronic kidney disease: insulin dependent. not well controlled. Hemoglobin A1c of 9.1% on 01/14/18  4. Anemia with renal failure: Hemoglobin has drifted down slightly to 8.7.  Received blood transfusion earlier in the week.  Continue to monitor CBC.  LOS: 7 Royalty Domagala 4/16/20203:19 PM

## 2018-09-17 NOTE — TOC Initial Note (Signed)
Transition of Care [facility]) - Initial/Assessment Note    Patient Details  Name: Phillip [name] MRN: [id number] Date of Birth: 19-Jul-1955  [facility] CM/SW Contact:    Beverly Sessions, RN Phone Number: 09/17/2018, 3:42 PM  Clinical Narrative:                 Patient status post laparoscopic appendectomy, now with ARF. Patient lives at home with wife.  PCP Bea Graff.  Pharmacy [facility] in [place].  Patient denies issues with obtaining medications.  Patient states that at his baseline he is able to drive, and he wife can if for some reason he can't.   PT has assessed patient and recommends home health.  Patient was open with [facility] in November of 2019 and would like to use them again.  CMS Medicare.gov Compare Post Acute Care list reviewed with patient.  Referral made to [facility] with [facility].  Patient states that he does infact have a rolling walker at home.  States that he only has had it for about a year.   Expected Discharge Plan: [place] Barriers to Discharge: Continued Medical Work up   Patient Goals and CMS Choice Patient states their goals for this hospitalization and ongoing recovery are:: "i want to get on home" CMS Medicare.gov Compare Post Acute Care list provided to:: Patient Choice offered to / list presented to : Patient  Expected Discharge Plan and Services Expected Discharge Plan: [place]   Discharge Planning Services: CM Consult Post Acute Care Choice: [place] arrangements for the past 2 months: Single Family Home Expected Discharge Date: 09/09/18                   HH Arranged: RN, PT [place] Agency: [place] ([facility])  Prior Living Arrangements/Services Living arrangements for the past 2 months: Single Family Home Lives with:: Spouse   Do you feel safe going back to the place where you live?: Yes          Current home services: DME    Activities of  Daily Living Home Assistive Devices/Equipment: None ADL Screening (condition at time of admission) Patient's cognitive ability adequate to safely complete daily activities?: Yes Is the patient deaf or have difficulty hearing?: No Does the patient have difficulty seeing, even when wearing glasses/contacts?: No Does the patient have difficulty concentrating, remembering, or making decisions?: No Patient able to express need for assistance with ADLs?: Yes Does the patient have difficulty dressing or bathing?: No Independently performs ADLs?: Yes (appropriate for developmental age) Does the patient have difficulty walking or climbing stairs?: No Weakness of Legs: None Weakness of Arms/Hands: None  Permission Sought/Granted                  Emotional Assessment       Orientation: : Oriented to Self, Oriented to Place, Oriented to  Time, Oriented to Situation   Psych Involvement: No (comment)  Admission diagnosis:  Acute appendicitis, unspecified acute appendicitis type [K35.80] Patient Active Problem List   Diagnosis Date Noted  . Appendicitis 09/08/2018  . Foot ulcer ([place]) 04/24/2018  . Diabetes mellitus without complication ([place]) 54/00/8676  . Hypertension 02/24/2018  . CHF (congestive heart failure) ([place]) 02/24/2018  . Atherosclerosis of native arteries of the extremities with ulceration ([place]) 02/24/2018   PCP:  Raina Mina., MD Pharmacy:   [facility], [place] [address] Phone: [phone] Fax: [place], [place] - [address] [address] [address] [place]  [address] Phone: [phone] Fax: [phone]     Social Determinants of Health (SDOH) Interventions    Readmission Risk Interventions No flowsheet data found.

## 2018-09-17 NOTE — Progress Notes (Signed)
[place] Hospital Day(s): 7.   Post op day(s): 9 Days Post-Op.   Interval History: Patient seen and examined, no acute events or new complaints overnight. Patient reports that he is feeling pretty good this morning. Mild abdominal soreness which is unchanged. No reports of fever, chills, nausea, or emesis. Diarrhea improved, negative for c diff. He has tolerated a renal diet without issue. His WBC remains elevated but slightly improved today. Renal function stable and still making good urine. Working with PT.   Review of Systems:  Constitutional: denies fever, chills  Respiratory: denies any shortness of breath  Cardiovascular: denies chest pain or palpitations  Gastrointestinal: denies abdominal pain, N/V, or diarrhea/and bowel function as per interval history Integumentary: + Laparoscopic incisions   Vital signs in last 24 hours: [min-max] current  Temp:  [98.4 F (36.9 C)-99.7 F (37.6 C)] 98.4 F (36.9 C) (04/16 0342) Pulse Rate:  [80-109] 80 (04/16 0342) Resp:  [18] 18 (04/16 0342) BP: (148-180)/(73-84) 148/78 (04/16 0342) SpO2:  [88 %-95 %] 93 % (04/16 0342)     Height: 6\' 1"  (185.4 cm) Weight: 110.2 kg BMI (Calculated): 32.07   Intake/Output this shift:  Total I/O In: 50 [IV Piggyback:50] Out: -      Physical Exam:  Constitutional: alert, cooperative and no distress  Respiratory: breathing non-labored at rest, on 1L [place] Cardiovascular: regular rate and sinus rhythm  Gastrointestinal: soft, non-tender, and non-distended Integumentary:Laparoscopic incisions are CDI, no erythema or drainage. Significant ecchymosis periumbilically and suprapubically   Labs:  CBC Latest Ref Rng & Units 09/17/2018 09/16/2018 09/16/2018  WBC 4.0 - 10.5 K/uL 15.6(H) 16.4(H) -  Hemoglobin 13.0 - 17.0 g/dL 8.7(L) 9.1(L) 8.4(L)  Hematocrit 39.0 - 52.0 % 28.2(L) 29.1(L) 26.5(L)  Platelets 150 - 400 K/uL 273 258 -   CMP Latest Ref Rng & Units  09/17/2018 09/16/2018 09/15/2018  Glucose 70 - 99 mg/dL 84 159(H) 130(H)  BUN 8 - 23 mg/dL 77(H) 81(H) 87(H)  Creatinine 0.61 - 1.24 mg/dL 4.87(H) 4.85(H) 5.36(H)  Sodium 135 - 145 mmol/L 143 142 143  Potassium 3.5 - 5.1 mmol/L 3.2(L) 3.0(L) 3.2(L)  Chloride 98 - 111 mmol/L 93(L) 90(L) 96(L)  CO2 22 - 32 mmol/L 35(H) 37(H) 32  Calcium 8.9 - 10.3 mg/dL 7.6(L) 7.9(L) 7.6(L)  Total Protein 6.5 - 8.1 g/dL - 6.9 -  Total Bilirubin 0.3 - 1.2 mg/dL - 1.6(H) -  Alkaline Phos 38 - 126 U/L - 47 -  AST 15 - 41 U/L - 25 -  ALT 0 - 44 U/L - 16 -    Imaging studies: No new pertinent imaging studies   Assessment/Plan: (ICD-10's: K35.80) 63 y.o. male with slightly improved but still persistently elevated leukocytosis and stable but still elevated renal function still with good U/O 9 Days Post-Op s/p laparoscopic appendectomy for acute appendicitis.   - Continue renal diet   - Continue IV ABx (Zosyn); renal dosing  - IVF per nephrology  - Monitor abdominal examination; on-going bowel   - Monitor leukocytosis; stable; no obvious source of infection  - Monitor renal function; morning BMP  - Consider removing foley catheter; asked nephrology for input on whether or not they would like to keep for U/O monitoring   - Appreciate nephrology involvement; no indication for HD at this time - Appreciate medicine'shelp with comorbidities    All of the above findings and recommendations were discussed with the patient, and the medical team, and all of patient's questions were answered  to his expressed satisfaction.  -- Edison Simon, PA-C [facility] 09/17/2018, 11:07 AM [phone] M-F: 7am - 4pm

## 2018-09-17 NOTE — Progress Notes (Signed)
[place] at [place] NAME: Phillip [name]    MR#:  [id number]  DATE OF BIRTH:  06-30-1955  SUBJECTIVE:  CHIEF COMPLAINT:   Chief Complaint  Patient presents with  . Abdominal Pain   Patient still weak but reports is feeling much better today.  He was able to get out of the bed and sit at the side of the bed yesterday.  Denies any concerns.  Was able to tolerate food/ liquids.  No fever in last 24 hours.  Hemoglobin came up after blood transfusion.  Denies any active bleed.  REVIEW OF SYSTEMS:  Review of Systems  Constitutional: Negative for chills and fever.  HENT: Negative for sore throat.   Respiratory: Negative for cough, shortness of breath and wheezing.   Cardiovascular: Negative for chest pain and leg swelling.  Gastrointestinal: Negative for abdominal pain, diarrhea, nausea and vomiting.  Genitourinary: Negative for dysuria.  Musculoskeletal: Negative for back pain and myalgias.  Neurological: Negative for dizziness, sensory change, speech change, focal weakness and headaches.   DRUG ALLERGIES:  No Known Allergies  VITALS:  Blood pressure (!) 178/87, pulse 88, temperature 98.4 F (36.9 C), temperature source Oral, resp. rate 18, height 6\' 1"  (1.854 m), weight 110.2 kg, SpO2 98 %.  PHYSICAL EXAMINATION:   GENERAL:  62 y.o.-year-old patient lying in the bed with no acute distress.  EYES: Pupils equal, round, reactive to light and accommodation. No scleral icterus. Extraocular muscles intact.  HEENT: Head atraumatic, normocephalic. Oropharynx and nasopharynx clear.  NECK:  Supple, no jugular venous distention. No thyroid enlargement, no tenderness.  LUNGS: Normal breath sounds bilaterally, no wheezing, rales,rhonchi or crepitation. No use of accessory muscles of respiration.  CARDIOVASCULAR: S1, S2 normal. No murmurs, rubs, or gallops.  ABDOMEN: Soft,tender, some distended. Bowel sounds present. No organomegaly or mass.   EXTREMITIES: No pedal edema, cyanosis, or clubbing.  NEUROLOGIC: Cranial nerves II through XII are intact. Muscle strength 5/5 in all extremities. Sensation intact. Gait not checked.  PSYCHIATRIC: The patient is alert and oriented x 3.  SKIN: No obvious rash, lesion, or ulcer.   LABORATORY PANEL:   CBC Recent Labs  Lab 09/17/18 0355  WBC 15.6*  HGB 8.7*  HCT 28.2*  PLT 273   ------------------------------------------------------------------------------------------------------------------  Chemistries  Recent Labs  Lab 09/15/18 0249 09/16/18 0449 09/17/18 0355  NA 143 142 143  K 3.2* 3.0* 3.2*  CL 96* 90* 93*  CO2 32 37* 35*  GLUCOSE 130* 159* 84  BUN 87* 81* 77*  CREATININE 5.36* 4.85* 4.87*  CALCIUM 7.6* 7.9* 7.6*  MG 2.1  --   --   AST  --  25  --   ALT  --  16  --   ALKPHOS  --  47  --   BILITOT  --  1.6*  --    ------------------------------------------------------------------------------------------------------------------  Cardiac Enzymes No results for input(s): TROPONINI in the last 168 hours. ------------------------------------------------------------------------------------------------------------------  RADIOLOGY:  No results found.  EKG:  No orders found for this or any previous visit.  ASSESSMENT AND PLAN:   63 year old male presenting with  right lower quadrant pain, nausea, fever of 102.5 and chills.CT abdomen and pelvis showed evidence of dilated appendix with appendiceal inflammation consistent with acute appendicitis.  1. Acute renal failure on chronic kidney disease stage III with proteinuria: baseline creatinine of 2.23, GFR of 35 on 08/10/2018.  Likely secondary to ATN from IV contrast nephropathy, acute appendicitis -Nephrology following -Holding ACE inhibitors,  diuretics and nephrotoxins -IVFs hydration with sodium bicarb.  May stop IV fluids today. -Per nephrology no indication for dialysis at this time as have good urine out put.   2. Hypertension:  BP 157/78 this morning. Allowing permissive hypertension per nephrology -Continue hydralazine, carvedilol and isosorbide mononitrate.  3. Insulin-dependent diabetes mellitus with complications: Not well controlled, was on glipizide and Levemir at home -hemoglobin A1c high. -on sliding scale and long-acting -Diabetes coordinator had seen for blood sugar management- added lantus and meal time coverage.  4.  Anemia of chronic disease: Hemoglobin 10.3 status post PRBC transfusion on 4/10 improved at 8.3- again dropped < 8, transfuse 1 unit again 09/15/18. Improved.  5.  Acute appendicitis: s/p laparoscopic appendectomyfor acute appendicitis. Post OP #5 - Management per surgery and GI.  6.   Chronic diastolic congestive Heart Failure: Mild cardiomyopathy. Last Echo 2013 -On carvedilol and isosorbide mononitrate -Check Daily weights, close monitoring of I&O -Holding diuretics -Recommend Low salt diet  -need outpatient follow up with cardiology -Reviewed echocardiogram, ejection fraction 50 to 55% with concentric hypertrophy.  6. GERD: On Protonix for GI prophylaxis with   7.  DVT prophylaxis - Holding anticoagulation and antiplatelets given elevated creatinine and history of bleed?.  He is ambulatory now/SCDs.  8. Fever- blood cx sent. CT abd have some intraperitoneal hemorrhage- this could be reason, monitor per surgical team. Blood cultures are negative so far. No more fever.  All the records are reviewed and case discussed with Care Management/Social Workerr. Management plans discussed with the patient, family and they are in agreement.  CODE STATUS: FULL  TOTAL TIME TAKING CARE OF THIS PATIENT: 36 minutes.   POSSIBLE D/C IN 1-2 DAYS, DEPENDING ON CLINICAL CONDITION.   Collene Mares   09/17/2018 at 2:15 PM  Between 7am to 6pm - Pager - [phone]  After 6pm go to [email or web address] - password [email or web address] [facility]  Office   [phone]  CC: Primary care physician; Cyndi Bender, PA-C

## 2018-09-18 LAB — CBC
HCT: 30.6 % — ABNORMAL LOW (ref 39.0–52.0)
Hemoglobin: 9.5 g/dL — ABNORMAL LOW (ref 13.0–17.0)
MCH: 27.4 pg (ref 26.0–34.0)
MCHC: 31 g/dL (ref 30.0–36.0)
MCV: 88.2 fL (ref 80.0–100.0)
Platelets: 323 10*3/uL (ref 150–400)
RBC: 3.47 MIL/uL — ABNORMAL LOW (ref 4.22–5.81)
RDW: 17.1 % — ABNORMAL HIGH (ref 11.5–15.5)
WBC: 17.1 10*3/uL — ABNORMAL HIGH (ref 4.0–10.5)
nRBC: 0 % (ref 0.0–0.2)

## 2018-09-18 LAB — BPAM RBC
Blood Product Expiration Date: 202005032359
Blood Product Expiration Date: 202005142359
ISSUE DATE / TIME: 202004141824
Unit Type and Rh: 5100
Unit Type and Rh: 5100

## 2018-09-18 LAB — BASIC METABOLIC PANEL
Anion gap: 13 (ref 5–15)
BUN: 72 mg/dL — ABNORMAL HIGH (ref 8–23)
CO2: 31 mmol/L (ref 22–32)
Calcium: 8 mg/dL — ABNORMAL LOW (ref 8.9–10.3)
Chloride: 96 mmol/L — ABNORMAL LOW (ref 98–111)
Creatinine, Ser: 4.49 mg/dL — ABNORMAL HIGH (ref 0.61–1.24)
GFR calc Af Amer: 15 mL/min — ABNORMAL LOW (ref >60)
GFR calc non Af Amer: 13 mL/min — ABNORMAL LOW (ref >60)
Glucose, Bld: 150 mg/dL — ABNORMAL HIGH (ref 70–99)
Potassium: 3.6 mmol/L (ref 3.5–5.1)
Sodium: 140 mmol/L (ref 135–145)

## 2018-09-18 LAB — TYPE AND SCREEN
ABO/RH(D): O POS
Antibody Screen: POSITIVE
DAT, IgG: POSITIVE
DAT, complement: NEGATIVE
Unit division: 0
Unit division: 0

## 2018-09-18 LAB — GLUCOSE, CAPILLARY
Glucose-Capillary: 124 mg/dL — ABNORMAL HIGH (ref 70–99)
Glucose-Capillary: 139 mg/dL — ABNORMAL HIGH (ref 70–99)
Glucose-Capillary: 131 mg/dL — ABNORMAL HIGH (ref 70–99)
Glucose-Capillary: 116 mg/dL — ABNORMAL HIGH (ref 70–99)

## 2018-09-18 LAB — MAGNESIUM: Magnesium: 2.1 mg/dL (ref 1.7–2.4)

## 2018-09-18 MED ORDER — AMLODIPINE BESYLATE 5 MG PO TABS
5.0000 mg | ORAL_TABLET | Freq: Every day | ORAL | Status: DC
  Administered 2018-09-18 – 2018-09-19 (×2): 5 mg via ORAL
  Filled 2018-09-18: qty 1

## 2018-09-18 MED ORDER — PIPERACILLIN-TAZOBACTAM 3.375 G IVPB
3.3750 g | Freq: Three times a day (TID) | INTRAVENOUS | Status: DC
  Administered 2018-09-18 – 2018-09-21 (×11): 3.375 g via INTRAVENOUS
  Filled 2018-09-18 (×10): qty 50

## 2018-09-18 MED ORDER — CARVEDILOL 12.5 MG PO TABS
12.5000 mg | ORAL_TABLET | Freq: Two times a day (BID) | ORAL | Status: DC
  Administered 2018-09-18 – 2018-09-19 (×2): 12.5 mg via ORAL
  Filled 2018-09-18 (×2): qty 1

## 2018-09-18 NOTE — Progress Notes (Signed)
[name] at [place] NAME: Phillip [name]    MR#:  [id number]  DATE OF BIRTH:  1955/09/30  SUBJECTIVE:  CHIEF COMPLAINT:   Chief Complaint  Patient presents with  . Abdominal Pain   Patient still weak but reports is feeling much better today.  He was able to get out of the bed and sit at the side of the bed yesterday.  Denies any concerns.  Was able to tolerate food/ liquids.  No fever in last 24 hours.  Hemoglobin came up after blood transfusion.  Denies any active bleed.  REVIEW OF SYSTEMS:  Review of Systems  Constitutional: Negative for chills and fever.  HENT: Negative for sore throat.   Respiratory: Negative for cough, shortness of breath and wheezing.   Cardiovascular: Negative for chest pain and leg swelling.  Gastrointestinal: Negative for abdominal pain, diarrhea, nausea and vomiting.  Genitourinary: Negative for dysuria.  Musculoskeletal: Negative for back pain and myalgias.  Neurological: Negative for dizziness, sensory change, speech change, focal weakness and headaches.   DRUG ALLERGIES:  No Known Allergies  VITALS:  Blood pressure (!) 169/81, pulse 79, temperature 98.6 F (37 C), temperature source Oral, resp. rate 19, height 6\' 1"  (1.854 m), weight 110.2 kg, SpO2 97 %.  PHYSICAL EXAMINATION:   GENERAL:  63 y.o.-year-old patient lying in the bed with no acute distress.  EYES: Pupils equal, round, reactive to light and accommodation. No scleral icterus. Extraocular muscles intact.  HEENT: Head atraumatic, normocephalic. Oropharynx and nasopharynx clear.  NECK:  Supple, no jugular venous distention. No thyroid enlargement, no tenderness.  LUNGS: Normal breath sounds bilaterally, no wheezing, rales,rhonchi or crepitation. No use of accessory muscles of respiration.  CARDIOVASCULAR: S1, S2 normal. No murmurs, rubs, or gallops.  ABDOMEN: Soft,tender, some distended. Bowel sounds present. No organomegaly or mass.   EXTREMITIES: No pedal edema, cyanosis, or clubbing.  NEUROLOGIC: Cranial nerves II through XII are intact. Muscle strength 5/5 in all extremities. Sensation intact. Gait not checked.  PSYCHIATRIC: The patient is alert and oriented x 3.  SKIN: No obvious rash, lesion, or ulcer.   LABORATORY PANEL:   CBC Recent Labs  Lab 09/18/18 0549  WBC 17.1*  HGB 9.5*  HCT 30.6*  PLT 323   ------------------------------------------------------------------------------------------------------------------  Chemistries  Recent Labs  Lab 09/16/18 0449  09/18/18 0549  NA 142   < > 140  K 3.0*   < > 3.6  CL 90*   < > 96*  CO2 37*   < > 31  GLUCOSE 159*   < > 150*  BUN 81*   < > 72*  CREATININE 4.85*   < > 4.49*  CALCIUM 7.9*   < > 8.0*  MG  --   --  2.1  AST 25  --   --   ALT 16  --   --   ALKPHOS 47  --   --   BILITOT 1.6*  --   --    < > = values in this interval not displayed.   ------------------------------------------------------------------------------------------------------------------  Cardiac Enzymes No results for input(s): TROPONINI in the last 168 hours. ------------------------------------------------------------------------------------------------------------------  RADIOLOGY:  No results found.  EKG:  No orders found for this or any previous visit.  ASSESSMENT AND PLAN:   63 year old male presenting with  right lower quadrant pain, nausea, fever of 102.5 and chills.CT abdomen and pelvis showed evidence of dilated appendix with appendiceal inflammation consistent with acute appendicitis.  1. Acute renal  failure on chronic kidney disease stage III with proteinuria: baseline creatinine of 2.23, GFR of 35 on 08/10/2018.  Likely secondary to ATN from IV contrast nephropathy, acute appendicitis -Nephrology following -Holding ACE inhibitors, diuretics and nephrotoxins -IVFs hydration with sodium bicarb.  Stopped IV fluid now.  Having good urine output. -Per nephrology no  indication for dialysis at this time as have good urine out put. -Still continue to monitor renal function as it is very slowly improving over last 1 week.  Does not know the new baseline yet.  2. Hypertension:  BP 157/78 this morning. Allowing permissive hypertension per nephrology -Continue hydralazine, carvedilol and isosorbide mononitrate.  3. Insulin-dependent diabetes mellitus with complications: Not well controlled, was on glipizide and Levemir at home -hemoglobin A1c high. -on sliding scale and long-acting -Diabetes coordinator had seen for blood sugar management- added lantus and meal time coverage.  4.  Anemia of chronic disease: Hemoglobin 10.3 status post PRBC transfusion on 4/10 improved at 8.3- again dropped < 8, transfuse 1 unit again 09/15/18. Improved.  5.  Acute appendicitis: s/p laparoscopic appendectomyfor acute appendicitis.  - Management per surgery and GI.  6.   Chronic diastolic congestive Heart Failure: Mild cardiomyopathy. Last Echo 2013 -On carvedilol and isosorbide mononitrate -Check Daily weights, close monitoring of I&O -Holding diuretics -Recommend Low salt diet  -need outpatient follow up with cardiology -Reviewed echocardiogram, ejection fraction 50 to 55% with concentric hypertrophy.  6. GERD: On Protonix for GI prophylaxis with   7.  DVT prophylaxis - Holding anticoagulation and antiplatelets given elevated creatinine and history of bleed?.  He is ambulatory now/SCDs.  8. Fever- blood cx sent. CT abd have some intraperitoneal hemorrhage- this could be reason, monitor per surgical team. Blood cultures are negative so far. No more fever.  All the records are reviewed and case discussed with Care Management/Social Workerr. Management plans discussed with the patient, family and they are in agreement.  CODE STATUS: FULL  TOTAL TIME TAKING CARE OF THIS PATIENT: 36 minutes.   POSSIBLE D/C IN 1-2 DAYS, DEPENDING ON CLINICAL CONDITION.    Collene Mares   09/18/2018 at 4:50 PM  Between 7am to 6pm - Pager - [phone]  After 6pm go to [email or web address] - password [email or web address] [facility]  Office  [phone]  CC: Primary care physician; Raina Mina., MD

## 2018-09-18 NOTE — Progress Notes (Addendum)
[place] Hospital Day(s): 8.   Post op day(s): 10 Days Post-Op.   Interval History: Patient seen and examined, no acute events or new complaints overnight. Patient reports he is doing well. No complaints of abdominal pain, fever, nausea, or emesis. WBC elevated again but no clear source and is afebrile. Renal function slightly improved today still making good urine (U/O - 1.1L). Tolerating renal diet. Endorsing flatus and BMs. Working with PT recommending [place].   Review of Systems:  Constitutional: denies fever, chills  Gastrointestinal: denies abdominal pain, N/V, or diarrhea/and bowel function as per interval history Integumentary: + Laparoscopic Incisions   Vital signs in last 24 hours: [min-max] current  Temp:  [98.4 F (36.9 C)-99.8 F (37.7 C)] 99 F (37.2 C) (04/17 0326) Pulse Rate:  [88-96] 91 (04/17 0326) Resp:  [16-20] 16 (04/17 0326) BP: (163-178)/(79-87) 178/87 (04/17 0326) SpO2:  [97 %-98 %] 97 % (04/17 0326)     Height: 6\' 1"  (185.4 cm) Weight: 110.2 kg BMI (Calculated): 32.07   Intake/Output this shift:  Total I/O In: 50 [IV Piggyback:50] Out: 850 [Urine:850]    Physical Exam:  Constitutional: alert, cooperative and no distress  Respiratory: breathing non-labored at rest Gastrointestinal: soft, non-tender, and non-distended Genitourinary: foley in place; clear urine in bag Integumentary:Laparoscopic incisions are CDI, no erythema or drainage. Significant ecchymosis periumbilically and suprapubically   Labs:  CBC Latest Ref Rng & Units 09/18/2018 09/17/2018 09/16/2018  WBC 4.0 - 10.5 K/uL 17.1(H) 15.6(H) 16.4(H)  Hemoglobin 13.0 - 17.0 g/dL 9.5(L) 8.7(L) 9.1(L)  Hematocrit 39.0 - 52.0 % 30.6(L) 28.2(L) 29.1(L)  Platelets 150 - 400 K/uL 323 273 258   CMP Latest Ref Rng & Units 09/18/2018 09/17/2018 09/16/2018  Glucose 70 - 99 mg/dL 150(H) 84 159(H)  BUN 8 - 23 mg/dL 72(H) 77(H) 81(H)  Creatinine 0.61 - 1.24 mg/dL  4.49(H) 4.87(H) 4.85(H)  Sodium 135 - 145 mmol/L 140 143 142  Potassium 3.5 - 5.1 mmol/L 3.6 3.2(L) 3.0(L)  Chloride 98 - 111 mmol/L 96(L) 93(L) 90(L)  CO2 22 - 32 mmol/L 31 35(H) 37(H)  Calcium 8.9 - 10.3 mg/dL 8.0(L) 7.6(L) 7.9(L)  Total Protein 6.5 - 8.1 g/dL - - 6.9  Total Bilirubin 0.3 - 1.2 mg/dL - - 1.6(H)  Alkaline Phos 38 - 126 U/L - - 47  AST 15 - 41 U/L - - 25  ALT 0 - 44 U/L - - 16    Imaging studies: No new pertinent imaging studies   Assessment/Plan: (ICD-10's: K35.80) 63 y.o. male still with leukocytosis without obvious source. He remains afebrile and labs demonstrate slowly improving renal function. Still with good urine out 10 Days Post-Op s/p laparoscopic appendectomy for acute appendicitis.   - Continue renal diet  - Continue IV ABx (Zosyn) - renal dosing; Day 10; needs to complete 14 days total   - IVF + Foley per nephrology; foley in place to closely monitor U/O given sCr elevation   - Monitor abdominal examination; on-going bowel function             - Monitor leukocytosis; no obvious source of infection             - Monitor renal function; morning BMP   - Appreciate nephrology involvement; no indication for HD at this time - Appreciate medicine'shelp with comorbidities     - Encouraged continued work with PT; recommending HHPT  All of the above findings and recommendations were discussed with the patient, patient's family via  phone, and the medical team, and all of patient's questions were answered to her expressed satisfaction.  -- Edison Simon, PA-C [facility] 09/18/2018, 10:36 AM [phone] M-F: 7am - 4pm  I saw and evaluated the patient.  I agree with the above documentation, exam, and plan, which I have edited where appropriate. Fredirick Maudlin  10:57 AM

## 2018-09-18 NOTE — Progress Notes (Signed)
Physical Therapy Treatment Patient Details Name: Phillip [name] MRN: [id number] DOB: Oct 18, 1955 Today's Date: 09/18/2018    History of Present Illness Phillip [name] is a 63yo male who comes to [facility] on 4/7 c 2D ABD pain in RLQ, taken to surgery 4/7 for laproscopic appendectomy. PMH: CHF, PAD, chronic renal insufficicency, CAD s/p CABG, PVD s/p hallux amputation. Pt had ABLA and transfused PRBC on 4/10.     PT Comments    Patient eager to participate in PT session despite being sleepy upon PT arrival. Patient states he has been up since midnight but is willing to participate in therapy. Patient transitioned from supine to sit with increased time for hand placement and full motion. Upon sitting patient required a minute prior to standing to rest. Sit to stand required Min A due to lowered bed height. Patient ambulated in hall with RW and CGA, verbal cueing required for keeping head up for upright trunk posture, noted foot positioning resulting in limited foot clearance. Patient's gait speed improved after initial 20 ft. Patient returned to EOB and performed seated strengthening and coordination interventions. Patient's coordination degraded with increasing velocity of movement. Patient returned to bed fatigued but pleased with progress in session today.  Current POC remains appropriate.     Follow Up Recommendations  Home health PT     Equipment Recommendations  Rolling walker with 5" wheels    Recommendations for Other Services       Precautions / Restrictions Precautions Precautions: Fall Restrictions Weight Bearing Restrictions: No    Mobility  Bed Mobility Overal bed mobility: Needs Assistance;Modified Independent Bed Mobility: Sit to Supine;Supine to Sit     Supine to sit: Supervision Sit to supine: Supervision      Transfers Overall transfer level: Needs assistance Equipment used: Rolling walker (2 wheeled) Transfers: Sit to/from Stand Sit to Stand: Min  assist Stand pivot transfers: (familiar technique with BUE on chair arms)       General transfer comment: Min A from lowered bed.  Ambulation/Gait Ambulation/Gait assistance: Supervision;Min guard Gait Distance (Feet): 190 Feet Assistive device: Rolling walker (2 wheeled)       General Gait Details: verbal cueing required for keeping head up for upright trunk posture, pronation of L foot noted with shuffle step, limited clearance during swing phase bilaterally.    Stairs             Wheelchair Mobility    Modified Rankin (Stroke Patients Only)       Balance Overall balance assessment: Modified Independent;Mild deficits observed, not formally tested                                          Cognition Arousal/Alertness: Awake/alert Behavior During Therapy: WFL for tasks assessed/performed Overall Cognitive Status: Within Functional Limits for tasks assessed                                 General Comments: sleepy upon PT entering room, improved with prolonged mobility/exercises      Exercises General Exercises - Lower Extremity Ankle Circles/Pumps: AROM;Strengthening;Both;10 reps;Seated Long Arc Quad: AROM;Strengthening;Both;10 reps;Seated Hip ABduction/ADduction: Strengthening;Both;10 reps;Seated(isometric's against PT hand: 10x abduction, 10x adduction ) Hip Flexion/Marching: Strengthening;Both;10 reps;Seated Toe Raises: AROM;Strengthening;Both;10 reps;Seated Other Exercises Other Exercises: seated:above exercises + toe flexion/extension for coordination and stability x 10, seated high 5's reaching  inside and outside BOS with increasing velocity of movement resulting in degredation of coordination x 5 minutes, no LOB    General Comments General comments (skin integrity, edema, etc.): slight edema noted in LE's.educated on ankle pump exercise for fluid control      Pertinent Vitals/Pain Pain Assessment: No/denies pain     Home Living                      Prior Function            PT Goals (current goals can now be found in the care plan section) Acute Rehab PT Goals Patient Stated Goal: regain strength and independent AMB  PT Goal Formulation: With patient Time For Goal Achievement: 09/29/18 Potential to Achieve Goals: Good Progress towards PT goals: Progressing toward goals    Frequency    Min 2X/week      PT Plan Current plan remains appropriate    Co-evaluation              AM-PAC PT "6 Clicks" Mobility   Outcome Measure  Help needed turning from your back to your side while in a flat bed without using bedrails?: None Help needed moving from lying on your back to sitting on the side of a flat bed without using bedrails?: None Help needed moving to and from a bed to a chair (including a wheelchair)?: A Little Help needed standing up from a chair using your arms (e.g., wheelchair or bedside chair)?: A Little Help needed to walk in hospital room?: A Little Help needed climbing 3-5 steps with a railing? : A Little 6 Click Score: 20    End of Session Equipment Utilized During Treatment: Gait belt Activity Tolerance: Patient tolerated treatment well;Patient limited by fatigue Patient left: in bed;with bed alarm set;with call bell/phone within reach Nurse Communication: Mobility status;Precautions PT Visit Diagnosis: Unsteadiness on feet (R26.81);Other abnormalities of gait and mobility (R26.89);Muscle weakness (generalized) (M62.81);Difficulty in walking, not elsewhere classified (R26.2)     Time: 1610-9604 PT Time Calculation (min) (ACUTE ONLY): 24 min  Charges:  $Gait Training: 8-22 mins $Therapeutic Exercise: 8-22 mins                     Janna Arch, PT, DPT    Janna Arch 09/18/2018, 9:57 AM

## 2018-09-18 NOTE — Care Management Important Message (Signed)
Important Message  Patient Details  Name: Phillip [name] MRN: [id number] Date of Birth: 10-Apr-1956   Medicare Important Message Given:  Yes    Dannette Barbara 09/18/2018, 11:43 AM

## 2018-09-18 NOTE — Progress Notes (Signed)
PHARMACY NOTE:  ANTIMICROBIAL RENAL DOSAGE ADJUSTMENT  Current antimicrobial regimen includes a mismatch between antimicrobial dosage and estimated renal function.  As per policy approved by the Pharmacy & Therapeutics and Medical Executive Committees, the antimicrobial dosage will be adjusted accordingly.  Current antimicrobial dosage:   Zosyn 3.375mg  IV extended infusion q 12 hours  Indication: Intra-abdominal infection  Renal Function:  Estimated Creatinine Clearance: 21.9 mL/min (A) (by C-G formula based on SCr of 4.49 mg/dL (H)).    Antimicrobial dosage has been changed to:   Zosyn 3.375mg  IV extended infusion q 8 hours  Additional comments: Daily positive trends in renal function with CrCl now > 20 ml/min  Thank you for allowing pharmacy to be a part of this patient's care.  Lu Duffel, PharmD, BCPS Clinical Pharmacist 09/18/2018 7:09 AM

## 2018-09-18 NOTE — Progress Notes (Signed)
[facility]  ROUNDING NOTE   Subjective:  Patient seen at bedside. Urine output 1.1 L over the preceding 24 hours. Creatinine currently 4.4. Slowly trending down.  Objective:  Vital signs in last 24 hours:  Temp:  [98.4 F (36.9 C)-99.8 F (37.7 C)] 99 F (37.2 C) (04/17 0326) Pulse Rate:  [88-96] 91 (04/17 0326) Resp:  [16-20] 16 (04/17 0326) BP: (163-178)/(79-87) 178/87 (04/17 0326) SpO2:  [97 %-98 %] 97 % (04/17 0326)  Weight change:  Filed Weights   09/08/18 1239  Weight: 110.2 kg    Intake/Output: I/O last 3 completed shifts: In: 61 [IV Piggyback:50] Out: 1750 [Urine:1750]   Intake/Output this shift:  Total I/O In: 50 [IV Piggyback:50] Out: 850 [Urine:850]  Physical Exam: General: NAD  Head: Normocephalic, atraumatic. Moist oral mucosal membranes  Eyes: Anicteric  Neck: Supple, trachea midline  Lungs:  Clear to auscultation  Heart: Regular rate and rhythm  Abdomen:  Soft, nontender  Extremities: 1+ peripheral edema.  Neurologic:  Awake, alert  Skin: No lesions  Access: none    Basic Metabolic Panel: Recent Labs  Lab 09/12/18 0526 09/13/18 0522 09/14/18 0302 09/14/18 2101 09/15/18 0249 09/16/18 0449 09/17/18 0355 09/18/18 0549  NA 141 141 143 141 143 142 143 140  K 4.4 3.8 3.4* 3.4* 3.2* 3.0* 3.2* 3.6  CL 100 99 97* 95* 96* 90* 93* 96*  CO2 25 27 31 31  32 37* 35* 31  GLUCOSE 161* 178* 131* 126* 130* 159* 84 150*  BUN 106* 109* 96* 85* 87* 81* 77* 72*  CREATININE 7.93* 7.45* 6.08* 5.32* 5.36* 4.85* 4.87* 4.49*  CALCIUM 7.8* 7.5* 7.7* 7.7* 7.6* 7.9* 7.6* 8.0*  MG  --   --   --   --  2.1  --   --  2.1  PHOS 7.4* 6.9* 5.6*  --  5.2*  --   --   --     Liver Function Tests: Recent Labs  Lab 09/12/18 0526 09/16/18 0449  AST 25 25  ALT 31 16  ALKPHOS 43 47  BILITOT 1.0 1.6*  PROT 7.0 6.9  ALBUMIN 3.1* 2.9*   No results for input(s): LIPASE, AMYLASE in the last 168 hours. No results for input(s): AMMONIA in the last 168  hours.  CBC: Recent Labs  Lab 09/14/18 2101 09/15/18 0249 09/16/18 0017 09/16/18 0449 09/17/18 0355 09/18/18 0549  WBC 15.0* 13.9*  --  16.4* 15.6* 17.1*  NEUTROABS  --  10.5*  --   --   --   --   HGB 8.3* 7.8* 8.4* 9.1* 8.7* 9.5*  HCT 26.0* 25.1* 26.5* 29.1* 28.2* 30.6*  MCV 88.4 89.0  --  88.2 89.5 88.2  PLT 218 183  --  258 273 323    Cardiac Enzymes: No results for input(s): CKTOTAL, CKMB, CKMBINDEX, TROPONINI in the last 168 hours.  BNP: Invalid input(s): POCBNP  CBG: Recent Labs  Lab 09/17/18 0751 09/17/18 1131 09/17/18 1644 09/17/18 2116 09/18/18 0730  GLUCAP 82 90 159* 133* 80*    Microbiology: Results for orders placed or performed during the hospital encounter of 09/08/18  Culture, blood (routine x 2)     Status: None (Preliminary result)   Collection Time: 09/14/18  9:02 PM  Result Value Ref Range Status   Specimen Description BLOOD BLOOD RIGHT HAND  Final   Special Requests   Final    BOTTLES DRAWN AEROBIC AND ANAEROBIC Blood Culture adequate volume   Culture   Final    NO  GROWTH 4 DAYS Performed at [facility], [address] [place], [place] [address]    Report Status PENDING  Incomplete  Culture, blood (routine x 2)     Status: None (Preliminary result)   Collection Time: 09/14/18  9:09 PM  Result Value Ref Range Status   Specimen Description BLOOD RIGHT ANTECUBITAL  Final   Special Requests   Final    BOTTLES DRAWN AEROBIC AND ANAEROBIC Blood Culture adequate volume   Culture   Final    NO GROWTH 4 DAYS Performed at [facility], [address], [place], [place] [address]    Report Status PENDING  Incomplete  C difficile quick scan w PCR reflex     Status: None   Collection Time: 09/16/18  6:39 PM  Result Value Ref Range Status   C Diff antigen NEGATIVE NEGATIVE Final   C Diff toxin NEGATIVE NEGATIVE Final   C Diff interpretation No C. difficile detected.  Final    Comment: Performed at [facility],  [address] [place], [place] [address]    Coagulation Studies: No results for input(s): LABPROT, INR in the last 72 hours.  Urinalysis: No results for input(s): COLORURINE, LABSPEC, PHURINE, GLUCOSEU, HGBUR, BILIRUBINUR, KETONESUR, PROTEINUR, UROBILINOGEN, NITRITE, LEUKOCYTESUR in the last 72 hours.  Invalid input(s): APPERANCEUR    Imaging: No results found.   Medications:   . sodium chloride Stopped (09/16/18 1131)  . piperacillin-tazobactam (ZOSYN)  IV 3.375 g (09/18/18 1023)   . sodium chloride   Intravenous Once  . acetaminophen  1,000 mg Oral Q6H  . carvedilol  12.5 mg Oral BID WC  . insulin aspart  0-15 Units Subcutaneous TID WC  . insulin aspart  0-5 Units Subcutaneous QHS  . insulin aspart  4 Units Subcutaneous TID WC  . insulin glargine  10 Units Subcutaneous BID  . isosorbide mononitrate  120 mg Oral Daily  . pantoprazole  40 mg Oral QHS  . polyethylene glycol  17 g Oral Daily  . potassium chloride  20 mEq Oral BID   sodium chloride, dextrose, hydrALAZINE, HYDROmorphone (DILAUDID) injection, loperamide, nitroGLYCERIN, ondansetron **OR** ondansetron (ZOFRAN) IV, oxyCODONE, prochlorperazine **OR** prochlorperazine, traMADol  Assessment/ Plan:  [name] is a 63 y.o. black male with diabetes mellitus type II, diabetic neuropathy, hypertension, coronary artery disease status post CABG, peripheral vascular disease, status post left toe amputation, congestive heart failure, asthma, who was admitted to [facility] on 09/08/2018 for Acute appendicitis   1. Acute renal failure on chronic kidney disease stage III with proteinuria: baseline creatinine of 2.23, GFR of 35 on 08/10/2018.  Chronic Kidney Disease secondary to diabetic nephropathy Acute renal failure secondary to ATN from IV contrast nephropathy (4/7) and possibly from acute illness, acute appendicitis.  No indication for dialysis. Nonoliguric.  - Holding lisinopril, furosemide, spironolactone, potassium  chloride, and metolazone.  -Over the past week the patient's renal function has improved very slowly.  BUN currently 72 with a creatinine of 4.4.  Unclear as to where his renal function will stabilize now.  2. Hypertension: Continue carvedilol and isosorbide mononitrate.  Blood pressure remains high.  Add amlodipine 5 mg daily.  3. Diabetes mellitus type II with chronic kidney disease: insulin dependent. not well controlled. Hemoglobin A1c of 9.1% on 01/14/18  4. Anemia with renal failure: Hemoglobin up to 9.5.  Continue to monitor.   LOS: 8 Jaileen Janelle 4/17/202011:21 AM

## 2018-09-19 LAB — CBC
HCT: 29.6 % — ABNORMAL LOW (ref 39.0–52.0)
Hemoglobin: 9.1 g/dL — ABNORMAL LOW (ref 13.0–17.0)
MCH: 27.9 pg (ref 26.0–34.0)
MCHC: 30.7 g/dL (ref 30.0–36.0)
MCV: 90.8 fL (ref 80.0–100.0)
Platelets: 307 10*3/uL (ref 150–400)
RBC: 3.26 MIL/uL — ABNORMAL LOW (ref 4.22–5.81)
RDW: 17.1 % — ABNORMAL HIGH (ref 11.5–15.5)
WBC: 15.6 10*3/uL — ABNORMAL HIGH (ref 4.0–10.5)
nRBC: 0 % (ref 0.0–0.2)

## 2018-09-19 LAB — CULTURE, BLOOD (ROUTINE X 2)
Culture: NO GROWTH
Culture: NO GROWTH
Special Requests: ADEQUATE
Special Requests: ADEQUATE

## 2018-09-19 LAB — GLUCOSE, CAPILLARY
Glucose-Capillary: 118 mg/dL — ABNORMAL HIGH (ref 70–99)
Glucose-Capillary: 134 mg/dL — ABNORMAL HIGH (ref 70–99)
Glucose-Capillary: 130 mg/dL — ABNORMAL HIGH (ref 70–99)
Glucose-Capillary: 146 mg/dL — ABNORMAL HIGH (ref 70–99)

## 2018-09-19 LAB — BASIC METABOLIC PANEL
Anion gap: 10 (ref 5–15)
BUN: 65 mg/dL — ABNORMAL HIGH (ref 8–23)
CO2: 32 mmol/L (ref 22–32)
Calcium: 8.1 mg/dL — ABNORMAL LOW (ref 8.9–10.3)
Chloride: 101 mmol/L (ref 98–111)
Creatinine, Ser: 4.16 mg/dL — ABNORMAL HIGH (ref 0.61–1.24)
GFR calc Af Amer: 16 mL/min — ABNORMAL LOW (ref >60)
GFR calc non Af Amer: 14 mL/min — ABNORMAL LOW (ref >60)
Glucose, Bld: 156 mg/dL — ABNORMAL HIGH (ref 70–99)
Potassium: 3.9 mmol/L (ref 3.5–5.1)
Sodium: 143 mmol/L (ref 135–145)

## 2018-09-19 MED ORDER — CARVEDILOL 25 MG PO TABS
25.0000 mg | ORAL_TABLET | Freq: Two times a day (BID) | ORAL | Status: DC
  Administered 2018-09-19 – 2018-09-21 (×4): 25 mg via ORAL
  Filled 2018-09-19 (×4): qty 1

## 2018-09-19 MED ORDER — AMLODIPINE BESYLATE 10 MG PO TABS
10.0000 mg | ORAL_TABLET | Freq: Every day | ORAL | Status: DC
  Administered 2018-09-20 – 2018-09-21 (×2): 10 mg via ORAL
  Filled 2018-09-19 (×2): qty 1

## 2018-09-19 NOTE — Progress Notes (Signed)
[facility] NAME: Phillip [name]    MR#:  [id number]  DATE OF BIRTH:  02/01/1956  SUBJECTIVE:  CHIEF COMPLAINT:   Chief Complaint  Patient presents with  . Abdominal Pain   Sitting in a chair today.  No abdominal pain.  Tolerating diet.  Urine output 2300 mils over last 24 hours.  Is requesting to be discharged home.  REVIEW OF SYSTEMS:  Review of Systems  Constitutional: Negative for chills and fever.  HENT: Negative for sore throat.   Respiratory: Negative for cough, shortness of breath and wheezing.   Cardiovascular: Negative for chest pain and leg swelling.  Gastrointestinal: Negative for abdominal pain, diarrhea, nausea and vomiting.  Genitourinary: Negative for dysuria.  Musculoskeletal: Negative for back pain and myalgias.  Neurological: Negative for dizziness, sensory change, speech change, focal weakness and headaches.   DRUG ALLERGIES:  No Known Allergies  VITALS:  Blood pressure (!) 176/98, pulse 92, temperature 99.2 F (37.3 C), temperature source Oral, resp. rate 18, height 6\' 1"  (1.854 m), weight 110.2 kg, SpO2 96 %.  PHYSICAL EXAMINATION:   GENERAL:  63 y.o.-year-old patient lying in the bed with no acute distress.  EYES: Pupils equal, round, reactive to light and accommodation. No scleral icterus. Extraocular muscles intact.  HEENT: Head atraumatic, normocephalic. Oropharynx and nasopharynx clear.  NECK:  Supple, no jugular venous distention. No thyroid enlargement, no tenderness.  LUNGS: Normal breath sounds bilaterally, no wheezing, rales,rhonchi or crepitation. No use of accessory muscles of respiration.  CARDIOVASCULAR: S1, S2 normal. No murmurs, rubs, or gallops.  ABDOMEN: Soft,tender, some distended. Bowel sounds present. No organomegaly or mass.  EXTREMITIES: No pedal edema, cyanosis, or clubbing.  NEUROLOGIC: Cranial nerves II through XII are intact. Muscle strength 5/5 in all extremities.  Sensation intact. Gait not checked.  PSYCHIATRIC: The patient is alert and oriented x 3.  SKIN: No obvious rash, lesion, or ulcer.   LABORATORY PANEL:   CBC Recent Labs  Lab 09/19/18 0445  WBC 15.6*  HGB 9.1*  HCT 29.6*  PLT 307   ------------------------------------------------------------------------------------------------------------------  Chemistries  Recent Labs  Lab 09/16/18 0449  09/18/18 0549 09/19/18 0445  NA 142   < > 140 143  K 3.0*   < > 3.6 3.9  CL 90*   < > 96* 101  CO2 37*   < > 31 32  GLUCOSE 159*   < > 150* 156*  BUN 81*   < > 72* 65*  CREATININE 4.85*   < > 4.49* 4.16*  CALCIUM 7.9*   < > 8.0* 8.1*  MG  --   --  2.1  --   AST 25  --   --   --   ALT 16  --   --   --   ALKPHOS 47  --   --   --   BILITOT 1.6*  --   --   --    < > = values in this interval not displayed.   ------------------------------------------------------------------------------------------------------------------  Cardiac Enzymes No results for input(s): TROPONINI in the last 168 hours. ------------------------------------------------------------------------------------------------------------------  RADIOLOGY:  No results found.  EKG:  No orders found for this or any previous visit.  ASSESSMENT AND PLAN:   63 year old male presenting with  right lower quadrant pain, nausea, fever of 102.5 and chills.CT abdomen and pelvis showed evidence of dilated appendix with appendiceal inflammation consistent with acute appendicitis.  1. Acute renal failure on CKD III with proteinuria:  Baseline creatinine of 2.23, GFR of 35 on 08/10/2018.   Likely secondary to ATN from IV contrast nephropathy, acute appendicitis Good UOP today -Holding ACE inhibitors, diuretics and nephrotoxins  2. Hypertension: Uncontrolled Increased coreg and norvasc dose today.  3. Insulin-dependent diabetes mellitus with complications: Not well controlled, was on glipizide and Levemir at home -hemoglobin  A1c high. -on sliding scale and long-acting -Diabetes coordinator had seen for blood sugar management lantus SSI  4.  Anemia of chronic disease Stable for now S/p PRBC transfusion  5.  Acute appendicitis: s/p laparoscopic appendectomyfor acute appendicitis.  - Management per surgery and GI.  6.   Chronic diastolic congestive Heart Failure: Mild cardiomyopathy. Last Echo 2013 -On carvedilol and isosorbide mononitrate -Check Daily weights, close monitoring of I&O -Holding diuretics -need outpatient follow up with cardiology -Reviewed echocardiogram, ejection fraction 50 to 55% with concentric hypertrophy.  7.  DVT prophylaxis - SCDs  8. Fever- blood cx sent. CT abd have some intraperitoneal hemorrhage- this could be reason, monitor per surgical team. Blood cultures are negative so far. No more fever.  All the records are reviewed and case discussed with Care Management/Social Worker.  Management plans discussed with the patient, family and they are in agreement.  CODE STATUS: FULL  TOTAL TIME TAKING CARE OF THIS PATIENT: 30 minutes.   Likely discharge tomorrow on Augmentin if renal function is improving and patient doing well  Hillary Bow M.D.  09/19/2018 at 12:44 PM  Between 7am to 6pm - Pager - [phone]  After 6pm go to [email or web address] - password [email or web address] [email or web address]  Office  [phone]  CC: Primary care physician; Raina Mina., MD

## 2018-09-19 NOTE — Progress Notes (Signed)
[facility] Hospital Day(s): 9.   Post op day(s): 11 Days Post-Op.   Interval History: Patient seen and examined, no acute events or new complaints overnight. Patient reports he is doing well. No complaints of abdominal pain, fever, nausea, or emesis. WBC down slightly from yesterday. No clear source and is afebrile. Renal function slightly improved today still making good urine (U/O - 2.2L) Tolerating renal diet. Endorsing flatus and BMs. Working with PT recommending [facility].   Review of Systems:  Constitutional: denies fever, chills  Gastrointestinal: denies abdominal pain, N/V, or diarrhea/and bowel function as per interval history Integumentary: + Laparoscopic Incisions   Vital signs in last 24 hours: [min-max] current  Temp:  [98.6 F (37 C)-99.2 F (37.3 C)] 99.2 F (37.3 C) (04/18 0600) Pulse Rate:  [79-92] 92 (04/18 0600) Resp:  [18-19] 18 (04/18 0600) BP: (157-176)/(81-98) 176/98 (04/18 0600) SpO2:  [94 %-97 %] 96 % (04/18 0600)     Height: 6\' 1"  (185.4 cm) Weight: 110.2 kg BMI (Calculated): 32.07   Intake/Output this shift:  Total I/O In: 250.3 [P.O.:250; IV Piggyback:0.3] Out: -     Physical Exam:  Constitutional: alert, cooperative and no distress  Respiratory: breathing non-labored at rest Gastrointestinal: soft, non-tender, and non-distended Genitourinary: foley in place; clear urine in bag Integumentary:Laparoscopic incisions are CDI, no erythema or drainage. Significant ecchymosis periumbilically and suprapubically   Labs:  CBC Latest Ref Rng & Units 09/19/2018 09/18/2018 09/17/2018  WBC 4.0 - 10.5 K/uL 15.6(H) 17.1(H) 15.6(H)  Hemoglobin 13.0 - 17.0 g/dL 9.1(L) 9.5(L) 8.7(L)  Hematocrit 39.0 - 52.0 % 29.6(L) 30.6(L) 28.2(L)  Platelets 150 - 400 K/uL 307 323 273   CMP Latest Ref Rng & Units 09/19/2018 09/18/2018 09/17/2018  Glucose 70 - 99 mg/dL 156(H) 150(H) 84  BUN 8 - 23 mg/dL 65(H) 72(H) 77(H)  Creatinine 0.61 -  1.24 mg/dL 4.16(H) 4.49(H) 4.87(H)  Sodium 135 - 145 mmol/L 143 140 143  Potassium 3.5 - 5.1 mmol/L 3.9 3.6 3.2(L)  Chloride 98 - 111 mmol/L 101 96(L) 93(L)  CO2 22 - 32 mmol/L 32 31 35(H)  Calcium 8.9 - 10.3 mg/dL 8.1(L) 8.0(L) 7.6(L)  Total Protein 6.5 - 8.1 g/dL - - -  Total Bilirubin 0.3 - 1.2 mg/dL - - -  Alkaline Phos 38 - 126 U/L - - -  AST 15 - 41 U/L - - -  ALT 0 - 44 U/L - - -    Imaging studies: No new pertinent imaging studies   Assessment/Plan: (ICD-10's: K35.80) 63 y.o. male still with leukocytosis, slightly improved, without obvious source. He remains afebrile and labs demonstrate slowly improving renal function. Still with good urine out 11 Days Post-Op s/p laparoscopic appendectomy for acute appendicitis.   - Continue renal diet  - Continue IV ABx (Zosyn) - renal dosing; Day 10; needs to complete 14 days total   - IVF + Foley per nephrology; foley in place to closely monitor U/O given sCr elevation   - Monitor abdominal examination; on-going bowel function             - Monitor leukocytosis; no obvious source of infection             - Monitor renal function; morning BMP   - Appreciate nephrology involvement; no indication for HD at this time; await their recommendations prior to discharge of patient. - Appreciate medicine'shelp with comorbidities     - Encouraged continued work with PT; recommending HHPT

## 2018-09-20 ENCOUNTER — Telehealth: Payer: Self-pay | Admitting: General Surgery

## 2018-09-20 LAB — CBC
HCT: 30.2 % — ABNORMAL LOW (ref 39.0–52.0)
Hemoglobin: 9.3 g/dL — ABNORMAL LOW (ref 13.0–17.0)
MCH: 27.9 pg (ref 26.0–34.0)
MCHC: 30.8 g/dL (ref 30.0–36.0)
MCV: 90.7 fL (ref 80.0–100.0)
Platelets: 312 10*3/uL (ref 150–400)
RBC: 3.33 MIL/uL — ABNORMAL LOW (ref 4.22–5.81)
RDW: 17 % — ABNORMAL HIGH (ref 11.5–15.5)
WBC: 20 10*3/uL — ABNORMAL HIGH (ref 4.0–10.5)
nRBC: 0 % (ref 0.0–0.2)

## 2018-09-20 LAB — BASIC METABOLIC PANEL
Anion gap: 12 (ref 5–15)
BUN: 63 mg/dL — ABNORMAL HIGH (ref 8–23)
CO2: 27 mmol/L (ref 22–32)
Calcium: 8 mg/dL — ABNORMAL LOW (ref 8.9–10.3)
Chloride: 100 mmol/L (ref 98–111)
Creatinine, Ser: 4.19 mg/dL — ABNORMAL HIGH (ref 0.61–1.24)
GFR calc Af Amer: 16 mL/min — ABNORMAL LOW (ref >60)
GFR calc non Af Amer: 14 mL/min — ABNORMAL LOW (ref >60)
Glucose, Bld: 107 mg/dL — ABNORMAL HIGH (ref 70–99)
Potassium: 4.1 mmol/L (ref 3.5–5.1)
Sodium: 139 mmol/L (ref 135–145)

## 2018-09-20 LAB — GLUCOSE, CAPILLARY
Glucose-Capillary: 103 mg/dL — ABNORMAL HIGH (ref 70–99)
Glucose-Capillary: 80 mg/dL (ref 70–99)
Glucose-Capillary: 82 mg/dL (ref 70–99)
Glucose-Capillary: 123 mg/dL — ABNORMAL HIGH (ref 70–99)

## 2018-09-20 LAB — PHOSPHORUS: Phosphorus: 4 mg/dL (ref 2.5–4.6)

## 2018-09-20 LAB — MAGNESIUM: Magnesium: 2.1 mg/dL (ref 1.7–2.4)

## 2018-09-20 NOTE — Progress Notes (Signed)
[facility]  ROUNDING NOTE   Subjective:  Renal function continues to very slowly improve. BUN down slightly to 63. Creatinine currently 4.19. Patient still making urine. WBC count higher at 20 however.  Objective:  Vital signs in last 24 hours:  Temp:  [98.6 F (37 C)-99.5 F (37.5 C)] 98.6 F (37 C) (04/19 0455) Pulse Rate:  [82-98] 82 (04/19 0455) Resp:  [16-20] 20 (04/19 0455) BP: (138-172)/(70-88) 151/82 (04/19 0455) SpO2:  [98 %-100 %] 99 % (04/19 0455)  Weight change:  Filed Weights   09/08/18 1239  Weight: 110.2 kg    Intake/Output: I/O last 3 completed shifts: In: 523.9 [P.O.:370; I.V.:0.1; IV Piggyback:153.8] Out: 2325 [Urine:2325]   Intake/Output this shift:  Total I/O In: -  Out: 326 [Urine:325; Stool:1]  Physical Exam: General: NAD  Head: Normocephalic, atraumatic. Moist oral mucosal membranes  Eyes: Anicteric  Neck: Supple, trachea midline  Lungs:  Clear to auscultation  Heart: Regular rate and rhythm  Abdomen:  Soft, nontender  Extremities: 1+ peripheral edema.  Neurologic:  Awake, alert  Skin: No lesions  Access: none    Basic Metabolic Panel: Recent Labs  Lab 09/14/18 0302  09/15/18 0249 09/16/18 0449 09/17/18 0355 09/18/18 0549 09/19/18 0445 09/20/18 0529  NA 143   < > 143 142 143 140 143 139  K 3.4*   < > 3.2* 3.0* 3.2* 3.6 3.9 4.1  CL 97*   < > 96* 90* 93* 96* 101 100  CO2 31   < > 32 37* 35* 31 32 27  GLUCOSE 131*   < > 130* 159* 84 150* 156* 107*  BUN 96*   < > 87* 81* 77* 72* 65* 63*  CREATININE 6.08*   < > 5.36* 4.85* 4.87* 4.49* 4.16* 4.19*  CALCIUM 7.7*   < > 7.6* 7.9* 7.6* 8.0* 8.1* 8.0*  MG  --   --  2.1  --   --  2.1  --  2.1  PHOS 5.6*  --  5.2*  --   --   --   --  4.0   < > = values in this interval not displayed.    Liver Function Tests: Recent Labs  Lab 09/16/18 0449  AST 25  ALT 16  ALKPHOS 47  BILITOT 1.6*  PROT 6.9  ALBUMIN 2.9*   No results for input(s): LIPASE, AMYLASE in the last  168 hours. No results for input(s): AMMONIA in the last 168 hours.  CBC: Recent Labs  Lab 09/15/18 0249  09/16/18 0449 09/17/18 0355 09/18/18 0549 09/19/18 0445 09/20/18 0529  WBC 13.9*  --  16.4* 15.6* 17.1* 15.6* 20.0*  NEUTROABS 10.5*  --   --   --   --   --   --   HGB 7.8*   < > 9.1* 8.7* 9.5* 9.1* 9.3*  HCT 25.1*   < > 29.1* 28.2* 30.6* 29.6* 30.2*  MCV 89.0  --  88.2 89.5 88.2 90.8 90.7  PLT 183  --  258 273 323 307 312   < > = values in this interval not displayed.    Cardiac Enzymes: No results for input(s): CKTOTAL, CKMB, CKMBINDEX, TROPONINI in the last 168 hours.  BNP: Invalid input(s): POCBNP  CBG: Recent Labs  Lab 09/19/18 0728 09/19/18 1133 09/19/18 1645 09/19/18 2110 09/20/18 0801  GLUCAP 118* 134* 130* 146* 103*    Microbiology: Results for orders placed or performed during the hospital encounter of 09/08/18  Culture, blood (routine x 2)  Status: None   Collection Time: 09/14/18  9:02 PM  Result Value Ref Range Status   Specimen Description BLOOD BLOOD RIGHT HAND  Final   Special Requests   Final    BOTTLES DRAWN AEROBIC AND ANAEROBIC Blood Culture adequate volume   Culture   Final    NO GROWTH 5 DAYS Performed at [facility], [address] [place], [place] [address]    Report Status 09/19/2018 FINAL  Final  Culture, blood (routine x 2)     Status: None   Collection Time: 09/14/18  9:09 PM  Result Value Ref Range Status   Specimen Description BLOOD RIGHT ANTECUBITAL  Final   Special Requests   Final    BOTTLES DRAWN AEROBIC AND ANAEROBIC Blood Culture adequate volume   Culture   Final    NO GROWTH 5 DAYS Performed at [facility], [address] [place], [place] [address]    Report Status 09/19/2018 FINAL  Final  C difficile quick scan w PCR reflex     Status: None   Collection Time: 09/16/18  6:39 PM  Result Value Ref Range Status   C Diff antigen NEGATIVE NEGATIVE Final   C Diff toxin NEGATIVE NEGATIVE  Final   C Diff interpretation No C. difficile detected.  Final    Comment: Performed at [facility], [address] [place], [place] [address]    Coagulation Studies: No results for input(s): LABPROT, INR in the last 72 hours.  Urinalysis: No results for input(s): COLORURINE, LABSPEC, PHURINE, GLUCOSEU, HGBUR, BILIRUBINUR, KETONESUR, PROTEINUR, UROBILINOGEN, NITRITE, LEUKOCYTESUR in the last 72 hours.  Invalid input(s): APPERANCEUR    Imaging: No results found.   Medications:   . sodium chloride Stopped (09/20/18 1030)  . piperacillin-tazobactam (ZOSYN)  IV Stopped (09/20/18 1030)   . acetaminophen  1,000 mg Oral Q6H  . amLODipine  10 mg Oral Daily  . carvedilol  25 mg Oral BID WC  . insulin aspart  0-15 Units Subcutaneous TID WC  . insulin aspart  0-5 Units Subcutaneous QHS  . insulin aspart  4 Units Subcutaneous TID WC  . insulin glargine  10 Units Subcutaneous BID  . isosorbide mononitrate  120 mg Oral Daily  . pantoprazole  40 mg Oral QHS  . polyethylene glycol  17 g Oral Daily  . potassium chloride  20 mEq Oral BID   sodium chloride, dextrose, hydrALAZINE, HYDROmorphone (DILAUDID) injection, loperamide, nitroGLYCERIN, ondansetron **OR** ondansetron (ZOFRAN) IV, oxyCODONE, prochlorperazine **OR** prochlorperazine, traMADol  Assessment/ Plan:  [name] is a 63 y.o. black male with diabetes mellitus type II, diabetic neuropathy, hypertension, coronary artery disease status post CABG, peripheral vascular disease, status post left toe amputation, congestive heart failure, asthma, who was admitted to [facility] on 09/08/2018 for Acute appendicitis   1. Acute renal failure on chronic kidney disease stage III with proteinuria: baseline creatinine of 2.23, GFR of 35 on 08/10/2018.  Chronic Kidney Disease secondary to diabetic nephropathy Acute renal failure secondary to ATN from IV contrast nephropathy (4/7) and possibly from acute illness, acute appendicitis.   No indication for dialysis. Nonoliguric.  - Holding lisinopril, furosemide, spironolactone, potassium chloride, and metolazone.  -Renal function continues to very slowly improve.  BUN currently 63 with a creatinine of 4.19.  Acceptable urine output noted.  No indication for dialysis.  He will likely develop a new baseline after this episode of severe acute renal failure.  Continue to monitor renal parameters and avoid nephrotoxins as possible.  Patient  follows with Dr. Katherina Right as outpt.   2. Hypertension: Blood pressure better after the addition of amlodipine.  Continue amlodipine, carvedilol, and isosorbide mononitrate.  3. Diabetes mellitus type II with chronic kidney disease: insulin dependent. not well controlled. Hemoglobin A1c of 9.1% on 01/14/18  4. Anemia with renal failure: Hemoglobin currently 9.3.  No urgent indication for Epogen however this may need to be considered as an outpatient.   LOS: 10 Aengus Sauceda 4/19/202012:06 PM

## 2018-09-20 NOTE — Progress Notes (Signed)
[facility] NAME: Erin Uecker    MR#:  [id number]  DATE OF BIRTH:  02-Apr-1956  SUBJECTIVE:  CHIEF COMPLAINT:   Chief Complaint  Patient presents with  . Abdominal Pain   Sitting in a chair today.  No abdominal pain. UOP 1400 ml Afebrile Tolerating diet  REVIEW OF SYSTEMS:  Review of Systems  Constitutional: Negative for chills and fever.  HENT: Negative for sore throat.   Respiratory: Negative for cough, shortness of breath and wheezing.   Cardiovascular: Negative for chest pain and leg swelling.  Gastrointestinal: Negative for abdominal pain, diarrhea, nausea and vomiting.  Genitourinary: Negative for dysuria.  Musculoskeletal: Negative for back pain and myalgias.  Neurological: Negative for dizziness, sensory change, speech change, focal weakness and headaches.   DRUG ALLERGIES:  No Known Allergies  VITALS:  Blood pressure (!) 168/82, pulse 78, temperature 98.9 F (37.2 C), temperature source Oral, resp. rate 18, height 6\' 1"  (1.854 m), weight 110.2 kg, SpO2 100 %.  PHYSICAL EXAMINATION:   GENERAL:  63 y.o.-year-old patient lying in the bed with no acute distress.  EYES: Pupils equal, round, reactive to light and accommodation. No scleral icterus. Extraocular muscles intact.  HEENT: Head atraumatic, normocephalic. Oropharynx and nasopharynx clear.  NECK:  Supple, no jugular venous distention. No thyroid enlargement, no tenderness.  LUNGS: Normal breath sounds bilaterally, no wheezing, rales,rhonchi or crepitation. No use of accessory muscles of respiration.  CARDIOVASCULAR: S1, S2 normal. No murmurs, rubs, or gallops.  ABDOMEN: Soft,tender, some distended. Bowel sounds present. No organomegaly or mass.  EXTREMITIES: No pedal edema, cyanosis, or clubbing.  NEUROLOGIC: Cranial nerves II through XII are intact. Muscle strength 5/5 in all extremities. Sensation intact. Gait not checked.  PSYCHIATRIC: The patient is  alert and oriented x 3.  SKIN: No obvious rash, lesion, or ulcer.   LABORATORY PANEL:   CBC Recent Labs  Lab 09/20/18 0529  WBC 20.0*  HGB 9.3*  HCT 30.2*  PLT 312   ------------------------------------------------------------------------------------------------------------------  Chemistries  Recent Labs  Lab 09/16/18 0449  09/20/18 0529  NA 142   < > 139  K 3.0*   < > 4.1  CL 90*   < > 100  CO2 37*   < > 27  GLUCOSE 159*   < > 107*  BUN 81*   < > 63*  CREATININE 4.85*   < > 4.19*  CALCIUM 7.9*   < > 8.0*  MG  --    < > 2.1  AST 25  --   --   ALT 16  --   --   ALKPHOS 47  --   --   BILITOT 1.6*  --   --    < > = values in this interval not displayed.   ------------------------------------------------------------------------------------------------------------------  Cardiac Enzymes No results for input(s): TROPONINI in the last 168 hours. ------------------------------------------------------------------------------------------------------------------  RADIOLOGY:  No results found.  EKG:  No orders found for this or any previous visit.  ASSESSMENT AND PLAN:   63 year old male presenting with  right lower quadrant pain, nausea, fever of 102.5 and chills.CT abdomen and pelvis showed evidence of dilated appendix with appendiceal inflammation consistent with acute appendicitis.  1. Acute renal failure on CKD III with proteinuria: Baseline creatinine of 2.23, GFR of 35 on 08/10/2018.   Likely secondary to ATN from IV contrast nephropathy, acute appendicitis Good UOP  -Holding ACE inhibitors, diuretics and nephrotoxins - Creatinine around 4 and slowly trending  down Will need OP nephrology f/u  2. Hypertension: Uncontrolled Increased coreg and norvasc dose yesterday.  3. Insulin-dependent diabetes mellitus with complications: Not well controlled, was on glipizide and Levemir at home -hemoglobin A1c high. -on sliding scale and long-acting  4.  Anemia of  chronic disease Stable for now S/p PRBC transfusion  5.  Acute appendicitis: s/p laparoscopic appendectomyfor acute appendicitis.  - Management per surgery and GI.  6.   Chronic diastolic congestive Heart Failure: Mild cardiomyopathy. Last Echo 2013 -On carvedilol and isosorbide mononitrate -Check Daily weights, close monitoring of I&O -Holding diuretics -need outpatient follow up with cardiology -Reviewed echocardiogram, ejection fraction 50 to 55% with concentric hypertrophy.  7.  DVT prophylaxis - SCDs  8.Leucocytois On IV abx Worsened today. Repeat in AM  All the records are reviewed and case discussed with Care Management/Social Worker.  Management plans discussed with the patient, family and they are in agreement.  CODE STATUS: FULL  TOTAL TIME TAKING CARE OF THIS PATIENT: 30 minutes.   Likely discharge tomorrow on Augmentin if renal function is improving and patient doing well  Hillary Bow M.D.  09/20/2018 at 3:19 PM  Between 7am to 6pm - Pager - [phone]  After 6pm go to [email or web address] - password [email or web address]  Office  [phone]  CC: Primary care physician; Raina Mina., MD

## 2018-09-20 NOTE — Progress Notes (Signed)
[facility] Hospital Day(s): 10.   Post op day(s): 12 Days Post-Op.   Interval History: Patient seen and examined, no acute events or new complaints overnight. Patient reports he is doing well, but he is very frustrated that he is still in the hospital.. No complaints of abdominal pain, fever, nausea, or emesis. WBC elevated today.. No clear source and is afebrile. Renal function stable, he is tolerating a renal diet. Endorsing flatus and BMs. Working with PT recommending [place].   Review of Systems:  Constitutional: denies fever, chills  Gastrointestinal: denies abdominal pain, N/V, or diarrhea/and bowel function as per interval history Integumentary: + Laparoscopic Incisions   Vital signs in last 24 hours: [min-max] current  Temp:  [98.6 F (37 C)-99.3 F (37.4 C)] 98.9 F (37.2 C) (04/19 1211) Pulse Rate:  [78-87] 78 (04/19 1211) Resp:  [18-20] 18 (04/19 1211) BP: (138-168)/(70-82) 168/82 (04/19 1211) SpO2:  [98 %-100 %] 100 % (04/19 1211)     Height: 6\' 1"  (185.4 cm) Weight: 110.2 kg BMI (Calculated): 32.07   Intake/Output this shift:  Total I/O In: -  Out: 326 [Urine:325; Stool:1]    Physical Exam:  Constitutional: alert, cooperative and no distress  Respiratory: breathing non-labored at rest Gastrointestinal: soft, non-tender, and non-distended Genitourinary: foley in place; clear urine in bag Integumentary:Laparoscopic incisions are CDI, no erythema or drainage. Significant ecchymosis periumbilically and suprapubically   Labs:  CBC Latest Ref Rng & Units 09/20/2018 09/19/2018 09/18/2018  WBC 4.0 - 10.5 K/uL 20.0(H) 15.6(H) 17.1(H)  Hemoglobin 13.0 - 17.0 g/dL 9.3(L) 9.1(L) 9.5(L)  Hematocrit 39.0 - 52.0 % 30.2(L) 29.6(L) 30.6(L)  Platelets 150 - 400 K/uL 312 307 323   CMP Latest Ref Rng & Units 09/20/2018 09/19/2018 09/18/2018  Glucose 70 - 99 mg/dL 107(H) 156(H) 150(H)  BUN 8 - 23 mg/dL 63(H) 65(H) 72(H)  Creatinine 0.61 -  1.24 mg/dL 4.19(H) 4.16(H) 4.49(H)  Sodium 135 - 145 mmol/L 139 143 140  Potassium 3.5 - 5.1 mmol/L 4.1 3.9 3.6  Chloride 98 - 111 mmol/L 100 101 96(L)  CO2 22 - 32 mmol/L 27 32 31  Calcium 8.9 - 10.3 mg/dL 8.0(L) 8.1(L) 8.0(L)  Total Protein 6.5 - 8.1 g/dL - - -  Total Bilirubin 0.3 - 1.2 mg/dL - - -  Alkaline Phos 38 - 126 U/L - - -  AST 15 - 41 U/L - - -  ALT 0 - 44 U/L - - -    Imaging studies: No new pertinent imaging studies   Assessment/Plan: (ICD-10's: K35.80) 63 y.o. male still with leukocytosis, slightly worse today, without obvious source. He remains afebrile and labs demonstrate slowly improving renal function. Still with good urine out 12 Days Post-Op s/p laparoscopic appendectomy for acute appendicitis.   - Continue renal diet  - Continue IV ABx (Zosyn) - renal dosing; Day 10; needs to complete 14 days total   - Due to lack of other obvious source of leukocytosis, will DC Foley  - Monitor abdominal examination; on-going bowel function             - Monitor leukocytosis; no obvious source of infection             - Monitor renal function; morning BMP   - Appreciate nephrology involvement; no indication for HD at this time; patient should follow-up with his primary nephrologist upon discharge. - Appreciate medicine'shelp with comorbidities     - Encouraged continued work with PT; recommending HHPT

## 2018-09-20 NOTE — Telephone Encounter (Signed)
Called patient's wife to update her and explain why he is still needing to stay in the hospital (elevate WBC today).

## 2018-09-21 LAB — BASIC METABOLIC PANEL
Anion gap: 12 (ref 5–15)
BUN: 64 mg/dL — ABNORMAL HIGH (ref 8–23)
CO2: 27 mmol/L (ref 22–32)
Calcium: 8.2 mg/dL — ABNORMAL LOW (ref 8.9–10.3)
Chloride: 102 mmol/L (ref 98–111)
Creatinine, Ser: 4.24 mg/dL — ABNORMAL HIGH (ref 0.61–1.24)
GFR calc Af Amer: 16 mL/min — ABNORMAL LOW (ref >60)
GFR calc non Af Amer: 14 mL/min — ABNORMAL LOW (ref >60)
Glucose, Bld: 111 mg/dL — ABNORMAL HIGH (ref 70–99)
Potassium: 4.6 mmol/L (ref 3.5–5.1)
Sodium: 141 mmol/L (ref 135–145)

## 2018-09-21 LAB — CBC
HCT: 31 % — ABNORMAL LOW (ref 39.0–52.0)
Hemoglobin: 9.3 g/dL — ABNORMAL LOW (ref 13.0–17.0)
MCH: 27.5 pg (ref 26.0–34.0)
MCHC: 30 g/dL (ref 30.0–36.0)
MCV: 91.7 fL (ref 80.0–100.0)
Platelets: 365 10*3/uL (ref 150–400)
RBC: 3.38 MIL/uL — ABNORMAL LOW (ref 4.22–5.81)
RDW: 16.9 % — ABNORMAL HIGH (ref 11.5–15.5)
WBC: 21.3 10*3/uL — ABNORMAL HIGH (ref 4.0–10.5)
nRBC: 0 % (ref 0.0–0.2)

## 2018-09-21 LAB — GLUCOSE, CAPILLARY
Glucose-Capillary: 110 mg/dL — ABNORMAL HIGH (ref 70–99)
Glucose-Capillary: 146 mg/dL — ABNORMAL HIGH (ref 70–99)

## 2018-09-21 MED ORDER — OXYCODONE HCL 5 MG PO TABS: 5.0000 mg | ORAL_TABLET | ORAL | 0 refills | Status: DC | PRN

## 2018-09-21 MED ORDER — AMOXICILLIN-POT CLAVULANATE 875-125 MG PO TABS: 1.0000 | ORAL_TABLET | Freq: Two times a day (BID) | ORAL | 0 refills | Status: DC

## 2018-09-21 MED ORDER — HYDRALAZINE HCL 25 MG PO TABS: 25.0000 mg | ORAL_TABLET | Freq: Three times a day (TID) | ORAL | Status: DC

## 2018-09-21 MED ORDER — NEPRO/CARBSTEADY PO LIQD
237.0000 mL | Freq: Two times a day (BID) | ORAL | Status: DC
  Administered 2018-09-21: 11:00:00 237 mL via ORAL

## 2018-09-21 NOTE — Progress Notes (Signed)
[facility]  ROUNDING NOTE   Subjective:  Renal function continues to be compomised BUN /Cr critically elevated. Patient still making urine. WBC count higher at 21 however.  Objective:  Vital signs in last 24 hours:  Temp:  [98.5 F (36.9 C)-98.9 F (37.2 C)] 98.8 F (37.1 C) (04/20 0413) Pulse Rate:  [78-81] 81 (04/20 0413) Resp:  [18-19] 18 (04/20 0413) BP: (161-171)/(80-82) 161/82 (04/20 0413) SpO2:  [99 %-100 %] 99 % (04/20 0413)  Weight change:  Filed Weights   09/08/18 1239  Weight: 110.2 kg    Intake/Output: I/O last 3 completed shifts: In: 161 [P.O.:300; I.V.:62.2; IV Piggyback:178.8] Out: 1576 [Urine:1575; Stool:1]   Intake/Output this shift:  Total I/O In: 567.2 [P.O.:480; I.V.:44; IV Piggyback:43.2] Out: -   Physical Exam: General: NAD  Head: Normocephalic, atraumatic. Moist oral mucosal membranes  Eyes: Anicteric  Neck: Supple, trachea midline  Lungs:  Clear to auscultation  Heart: Regular rate and rhythm  Abdomen:  Soft, nontender  Extremities: 2+ peripheral edema upto upper thighs b/l  Neurologic:  Awake, alert  Skin: No lesions  Access: none    Basic Metabolic Panel: Recent Labs  Lab 09/15/18 0249  09/17/18 0355 09/18/18 0549 09/19/18 0445 09/20/18 0529 09/21/18 0347  NA 143   < > 143 140 143 139 141  K 3.2*   < > 3.2* 3.6 3.9 4.1 4.6  CL 96*   < > 93* 96* 101 100 102  CO2 32   < > 35* 31 32 27 27  GLUCOSE 130*   < > 84 150* 156* 107* 111*  BUN 87*   < > 77* 72* 65* 63* 64*  CREATININE 5.36*   < > 4.87* 4.49* 4.16* 4.19* 4.24*  CALCIUM 7.6*   < > 7.6* 8.0* 8.1* 8.0* 8.2*  MG 2.1  --   --  2.1  --  2.1  --   PHOS 5.2*  --   --   --   --  4.0  --    < > = values in this interval not displayed.    Liver Function Tests: Recent Labs  Lab 09/16/18 0449  AST 25  ALT 16  ALKPHOS 47  BILITOT 1.6*  PROT 6.9  ALBUMIN 2.9*   No results for input(s): LIPASE, AMYLASE in the last 168 hours. No results for input(s):  AMMONIA in the last 168 hours.  CBC: Recent Labs  Lab 09/15/18 0249  09/17/18 0355 09/18/18 0549 09/19/18 0445 09/20/18 0529 09/21/18 0347  WBC 13.9*   < > 15.6* 17.1* 15.6* 20.0* 21.3*  NEUTROABS 10.5*  --   --   --   --   --   --   HGB 7.8*   < > 8.7* 9.5* 9.1* 9.3* 9.3*  HCT 25.1*   < > 28.2* 30.6* 29.6* 30.2* 31.0*  MCV 89.0   < > 89.5 88.2 90.8 90.7 91.7  PLT 183   < > 273 323 307 312 365   < > = values in this interval not displayed.    Cardiac Enzymes: No results for input(s): CKTOTAL, CKMB, CKMBINDEX, TROPONINI in the last 168 hours.  BNP: Invalid input(s): POCBNP  CBG: Recent Labs  Lab 09/20/18 0801 09/20/18 1209 09/20/18 1643 09/20/18 2130 09/21/18 0728  GLUCAP 103* 80 82 123* 110*    Microbiology: Results for orders placed or performed during the hospital encounter of 09/08/18  Culture, blood (routine x 2)     Status: None   Collection Time:  09/14/18  9:02 PM  Result Value Ref Range Status   Specimen Description BLOOD BLOOD RIGHT HAND  Final   Special Requests   Final    BOTTLES DRAWN AEROBIC AND ANAEROBIC Blood Culture adequate volume   Culture   Final    NO GROWTH 5 DAYS Performed at [facility], [address], [place], [place] [address]    Report Status 09/19/2018 FINAL  Final  Culture, blood (routine x 2)     Status: None   Collection Time: 09/14/18  9:09 PM  Result Value Ref Range Status   Specimen Description BLOOD RIGHT ANTECUBITAL  Final   Special Requests   Final    BOTTLES DRAWN AEROBIC AND ANAEROBIC Blood Culture adequate volume   Culture   Final    NO GROWTH 5 DAYS Performed at [facility], [address] [place], [place] [address]    Report Status 09/19/2018 FINAL  Final  C difficile quick scan w PCR reflex     Status: None   Collection Time: 09/16/18  6:39 PM  Result Value Ref Range Status   C Diff antigen NEGATIVE NEGATIVE Final   C Diff toxin NEGATIVE NEGATIVE Final   C Diff interpretation No C.  difficile detected.  Final    Comment: Performed at [facility], [address] [place], [place] [address]    Coagulation Studies: No results for input(s): LABPROT, INR in the last 72 hours.  Urinalysis: No results for input(s): COLORURINE, LABSPEC, PHURINE, GLUCOSEU, HGBUR, BILIRUBINUR, KETONESUR, PROTEINUR, UROBILINOGEN, NITRITE, LEUKOCYTESUR in the last 72 hours.  Invalid input(s): APPERANCEUR    Imaging: No results found.   Medications:   . sodium chloride Stopped (09/21/18 0620)  . piperacillin-tazobactam (ZOSYN)  IV 3.375 g (09/21/18 0620)   . acetaminophen  1,000 mg Oral Q6H  . amLODipine  10 mg Oral Daily  . carvedilol  25 mg Oral BID WC  . feeding supplement (NEPRO CARB STEADY)  237 mL Oral BID BM  . hydrALAZINE  25 mg Oral Q8H  . insulin aspart  0-15 Units Subcutaneous TID WC  . insulin aspart  0-5 Units Subcutaneous QHS  . insulin aspart  4 Units Subcutaneous TID WC  . insulin glargine  10 Units Subcutaneous BID  . isosorbide mononitrate  120 mg Oral Daily  . pantoprazole  40 mg Oral QHS  . polyethylene glycol  17 g Oral Daily  . potassium chloride  20 mEq Oral BID   sodium chloride, dextrose, hydrALAZINE, HYDROmorphone (DILAUDID) injection, loperamide, nitroGLYCERIN, ondansetron **OR** ondansetron (ZOFRAN) IV, oxyCODONE, prochlorperazine **OR** prochlorperazine, traMADol  Assessment/ Plan:  [name] is a 63 y.o. black male with diabetes mellitus type II, diabetic neuropathy, hypertension, coronary artery disease status post CABG, peripheral vascular disease, status post left toe amputation, congestive heart failure, asthma, who was admitted to [facility] on 09/08/2018 for Acute appendicitis   1. Acute renal failure on chronic kidney disease stage III with proteinuria: baseline creatinine of 2.23, GFR of 35 on 08/10/2018.  Chronic Kidney Disease secondary to diabetic nephropathy Acute renal failure secondary to ATN from IV contrast nephropathy  (4/7) and possibly from acute illness, acute appendicitis.  No indication for dialysis. Nonoliguric.  - Holding lisinopril, furosemide, spironolactone, potassium chloride, and metolazone.  -Renal function continues to very slowly improve.  BUN currently 64 with a creatinine of 4.24.  Acceptable urine output noted.  No indication for dialysis.  He will likely develop a new baseline after this episode of severe  acute renal failure.  Patient expected to be discharged soon  Continue to monitor renal parameters and avoid nephrotoxins as possible.  Patient follows with Dr. Katherina Right as outpt. ([place]  2. Hypertension with CKD: Blood pressure better after the addition of amlodipine.  Continue amlodipine, carvedilol, and isosorbide mononitrate.  3. Diabetes mellitus type II with chronic kidney disease: insulin dependent. not well controlled. Hemoglobin A1c of 9.1% on 01/14/18  4. Anemia with renal failure: Hemoglobin currently 9.3.  No urgent indication for Epogen however this may need to be considered as an outpatient.   LOS: [place] 4/20/202011:55 AM

## 2018-09-21 NOTE — Care Management Important Message (Signed)
Important Message  Patient Details  Name: Phillip [name] MRN: [id number] Date of Birth: 1955-06-04   Medicare Important Message Given:  Yes    Dannette Barbara 09/21/2018, 11:15 AM

## 2018-09-21 NOTE — Progress Notes (Signed)
Initial Nutrition Assessment  DOCUMENTATION CODES:   Obesity unspecified  INTERVENTION:   Nepro Shake po BID, each supplement provides 425 kcal and 19 grams protein  Liberalize diet   Daily weights   Suspect pt at moderate refeed risk; recommend monitor K, Mg and P labs as oral intake improves.   NUTRITION DIAGNOSIS:   Inadequate oral intake related to acute illness as evidenced by meal completion < 25%.  GOAL:   Patient will meet greater than or equal to 90% of their needs  MONITOR:   PO intake, Supplement acceptance, Labs, Weight trends, I & O's, Skin  REASON FOR ASSESSMENT:   LOS    ASSESSMENT:   63 y.o. black male with diabetes mellitus type II, diabetic neuropathy, hypertension, coronary artery disease status post CABG, peripheral vascular disease, status post left toe amputation, congestive heart failure, asthma, who was admitted to [facility] on 09/08/2018 for Acute appendicitis and is now s/p appendectomy complicated by post op ileus and AKI   RD working remotely.  Spoke with pt via phone today. Pt reports he is feeling better today but states that his appetite has remained poor since admit. Pt documented to be eating <25% of meals. There is no new weight since admit; RD will request daily weights as pt with h/o CHF. Pt currently +7.5L on I & Os. RD will also add supplements and liberalize pt's diet to help him meet his estimated needs. Suspect pt at moderate refeed risk; recommend monitor K, Mg and P labs as oral intake improves.   Medications reviewed and include: insulin, protonix, miralax, KCl, zosyn  Labs reviewed: K 4.6 wnl, P 4.0 wnl, Mg 2.1 wnl wbc- 21.3(H), Hgb 9.3(L), Hct 31.0(L)  UOP- 336ml  Unable to complete Nutrition-Focused physical exam at this time.   Diet Order:   Diet Order            Diet Carb Modified Fluid consistency: Thin; Room service appropriate? Yes; Fluid restriction: 1500 mL Fluid  Diet effective now             EDUCATION NEEDS:    Not appropriate for education at this time  Skin:  Skin Assessment: Reviewed RN Assessment(ecchymosis, incision abdomen )  Last BM:  4/19- type 6  Height:   Ht Readings from Last 1 Encounters:  09/08/18 6\' 1"  (1.854 m)    Weight:   Wt Readings from Last 1 Encounters:  09/08/18 110.2 kg    Ideal Body Weight:  83.6 kg  BMI:  Body mass index is 32.06 kg/m.  Estimated Nutritional Needs:   Kcal:  2400-2700kcal/day   Protein:  120-135g/day   Fluid:  2L/day or per MD  Koleen Distance MS, RD, LDN Pager #- [phone] Office#- [phone] After Hours Pager: [phone]

## 2018-09-21 NOTE — TOC Transition Note (Signed)
Transition of Care [facility]) - CM/SW Discharge Note   Patient Details  Name: Phillip [name] MRN: [id number] Date of Birth: [birth year]  Transition of Care [facility] CM/SW Contact:  Beverly Sessions, RN Phone Number: 09/21/2018, 5:04 PM   Clinical Narrative:     Patient to discharge today.  Corene Cornea with [place] [place] notified of discharge.  Family to transport at discharge.   Final next level of care: [place] Barriers to Discharge: Barriers Resolved   Patient Goals and CMS Choice Patient states their goals for this hospitalization and ongoing recovery are:: "i want to get on home" CMS Medicare.gov Compare Post Acute Care list provided to:: Patient Choice offered to / list presented to : Patient  Discharge Placement                       Discharge Plan and Services   Discharge Planning Services: CM Consult Post Acute Care Choice: Home Health              HH Arranged: RN, PT [facility] Agency: [place] [facility]   Social Determinants of Health (SDOH) Interventions     Readmission Risk Interventions Readmission Risk Prevention Plan 09/21/2018  Transportation Screening Complete  PCP or Specialist Appt within 3-5 Days Complete  HRI or Home Care Consult Complete  Palliative Care Screening Not Applicable  Medication Review (RN Care Manager) Complete  Some recent data might be hidden

## 2018-09-21 NOTE — Progress Notes (Addendum)
Phillip [name] to be D/C'd home per MD order.  Discussed prescriptions and follow up appointments with the patient. Prescriptions given to patient, medication list explained in detail. Pt verbalized understanding.  Allergies as of 09/21/2018   No Known Allergies     Medication List    STOP taking these medications   furosemide 40 MG tablet Commonly known as:  LASIX   lisinopril 40 MG tablet Commonly known as:  ZESTRIL   metolazone 2.5 MG tablet Commonly known as:  ZAROXOLYN   potassium chloride 20 MEQ packet Commonly known as:  KLOR-CON   spironolactone 25 MG tablet Commonly known as:  ALDACTONE     TAKE these medications   acetaminophen 500 MG tablet Commonly known as:  TYLENOL Take 1,000 mg by mouth every 6 (six) hours as needed for moderate pain or headache.   amLODipine 5 MG tablet Commonly known as:  NORVASC Take 5 mg by mouth daily.   amoxicillin-clavulanate 875-125 MG tablet Commonly known as:  Augmentin Take 1 tablet by mouth 2 (two) times daily for 2 days.   aspirin 81 MG tablet Take 81 mg by mouth daily.   carvedilol 6.25 MG tablet Commonly known as:  COREG Take 6.25 mg by mouth 2 (two) times daily with a meal.   ciprofloxacin 500 MG tablet Commonly known as:  CIPRO Take 500 mg by mouth 2 (two) times daily.   cloNIDine 0.3 MG tablet Commonly known as:  CATAPRES Take 0.3 mg by mouth 2 (two) times daily.   clopidogrel 75 MG tablet Commonly known as:  Plavix Take 1 tablet (75 mg total) by mouth daily.   doxycycline 100 MG capsule Commonly known as:  VIBRAMYCIN Take 100 mg by mouth 2 (two) times daily.   doxycycline 100 MG tablet Commonly known as:  VIBRA-TABS Take 1 tablet (100 mg total) by mouth 2 (two) times daily.   gabapentin 600 MG tablet Commonly known as:  NEURONTIN Take 600 mg by mouth 2 (two) times daily.   glipiZIDE 10 MG 24 hr tablet Commonly known as:  GLUCOTROL XL Take 10 mg by mouth daily with breakfast.   isosorbide  mononitrate 120 MG 24 hr tablet Commonly known as:  IMDUR Take 120 mg by mouth daily.   Levemir 100 UNIT/ML injection Generic drug:  insulin detemir Inject 40-80 Units into the skin at bedtime. Inject 40 units subcutaneously every morning and 80 units every evening   lovastatin 40 MG tablet Commonly known as:  MEVACOR Take 40 mg by mouth at bedtime.   nitroGLYCERIN 0.4 MG SL tablet Commonly known as:  NITROSTAT Place 0.4 mg under the tongue every 5 (five) minutes as needed for chest pain.   oxyCODONE 5 MG immediate release tablet Commonly known as:  Oxy IR/ROXICODONE Take 1-2 tablets (5-10 mg total) by mouth every 4 (four) hours as needed for moderate pain.   oxyCODONE-acetaminophen 5-325 MG tablet Commonly known as:  PERCOCET/ROXICET Take 1 tablet by mouth every 4 (four) hours as needed.   vitamin B-12 500 MCG tablet Commonly known as:  CYANOCOBALAMIN Take 500 mcg by mouth daily.   Vitamin D (Ergocalciferol) 1.25 MG (50000 UT) Caps capsule Commonly known as:  DRISDOL Take 50,000 Units by mouth once a week.       Vitals:   09/21/18 0413 09/21/18 1239  BP: (!) 161/82 (!) 161/79  Pulse: 81 (!) 103  Resp: 18 16  Temp: 98.8 F (37.1 C) 99.5 F (37.5 C)  SpO2: 99% 99%  Skin clean, dry and intact without evidence of skin break down, no evidence of skin tears noted. IV catheter discontinued intact. Site without signs and symptoms of complications. Dressing and pressure applied. Pt denies pain at this time. No complaints noted.  An After Visit Summary was printed and given to the patient. Patient escorted via [place], and D/C home via private auto. Instructions reviewed with wife with all questions answered.   Idona Stach A Cindy Fullman

## 2018-09-21 NOTE — Discharge Summary (Signed)
[facility] SURGICAL DISCHARGE SUMMARY   Patient ID: Phillip [name] MRN: [id number] DOB/AGE: December 29, 1955 63 y.o.  Admit date: 09/08/2018 Discharge date: 09/21/2018  Discharge Diagnoses Acute Appendicitis Acute Renal Failure  Consultants Medicine Nephrology  Procedures 09/08/2018:  Laparoscopic Appendectomy   HPI: Phillip [name] is a 63 y.o. male to the ER with a 2-day history of abdominal pain.  He reports that the pain today is severe is located in the right lower quadrant and is constant.  The pain worsens with movements.  He did have nausea as well as decreased appetite.  No respiratory symptoms. He also reports some chills and had a temperature of 102.5 no emesis no diarrhea no anosmia. White count is 13.9. He does have some chronic sufficiency. CT scan personally reviewed there is evidence of dilated appendix with appendiceal inflammation. There is also evidence of ileitis. No evidence of perforation free air or abscess. Does have a history of coronary artery disease and had a CABG x4 more than 15 years ago but his functional status is very reasonable he is able to perform more than 4 METS of activity without any shortness of breath or chest pain. He is on dual antiplatelet therapy for a stent on his lower extremity secondary to peripheral vascular disease.  Hospital Course:  Informed consent was obtained and documented, and patient underwent uneventful laparoscopic appendectomy (04, 12/21/18). Post-operatively, the patient had issues with post-surgical ileus which resolved on POD4. Additionally, the patient slowly had an increase in his sCr and became oliguric on POD2 and nephrology was consult. His elevated renal function was felt to be an insult from IV contrast on presentation. His sCr peaked just below 8. He began making good urine and his sCr slowly trended down over the next few days. He intermittently developed worsening and improving leukocytosis throughout his  admission, and repeat imaging did not reveal any obvious sources. The remainder of patient's hospital course was essentially unremarkable, and discharge planning was initiated accordingly with patient safely able to be discharged home with appropriate discharge instructions, antibiotics (Augmentin x1 day to complete 14 day course), pain control, and outpatient follow-up after all of his questions were answered to his expressed satisfaction.  The above was discussed with Dr Dahlia Byes and Dr Rosana Hoes on the day of discharge. Clinically the patient appears stable without any obvious signs of infection, his renal function has stabilized and continues to make urine, he is tolerating a diet with bowel function, and is mobilizing appropriately.    Discharge Condition: Fair    Allergies as of 09/21/2018   No Known Allergies     Medication List    STOP taking these medications   furosemide 40 MG tablet Commonly known as:  LASIX   lisinopril 40 MG tablet Commonly known as:  ZESTRIL   metolazone 2.5 MG tablet Commonly known as:  ZAROXOLYN   potassium chloride 20 MEQ packet Commonly known as:  KLOR-CON   spironolactone 25 MG tablet Commonly known as:  ALDACTONE     TAKE these medications   acetaminophen 500 MG tablet Commonly known as:  TYLENOL Take 1,000 mg by mouth every 6 (six) hours as needed for moderate pain or headache.   amLODipine 5 MG tablet Commonly known as:  NORVASC Take 5 mg by mouth daily.   amoxicillin-clavulanate 875-125 MG tablet Commonly known as:  Augmentin Take 1 tablet by mouth 2 (two) times daily for 2 days.   aspirin 81 MG tablet Take 81 mg by mouth daily.  carvedilol 6.25 MG tablet Commonly known as:  COREG Take 6.25 mg by mouth 2 (two) times daily with a meal.   ciprofloxacin 500 MG tablet Commonly known as:  CIPRO Take 500 mg by mouth 2 (two) times daily.   cloNIDine 0.3 MG tablet Commonly known as:  CATAPRES Take 0.3 mg by mouth 2 (two) times  daily.   clopidogrel 75 MG tablet Commonly known as:  Plavix Take 1 tablet (75 mg total) by mouth daily.   doxycycline 100 MG capsule Commonly known as:  VIBRAMYCIN Take 100 mg by mouth 2 (two) times daily.   doxycycline 100 MG tablet Commonly known as:  VIBRA-TABS Take 1 tablet (100 mg total) by mouth 2 (two) times daily.   gabapentin 600 MG tablet Commonly known as:  NEURONTIN Take 600 mg by mouth 2 (two) times daily.   glipiZIDE 10 MG 24 hr tablet Commonly known as:  GLUCOTROL XL Take 10 mg by mouth daily with breakfast.   isosorbide mononitrate 120 MG 24 hr tablet Commonly known as:  IMDUR Take 120 mg by mouth daily.   Levemir 100 UNIT/ML injection Generic drug:  insulin detemir Inject 40-80 Units into the skin at bedtime. Inject 40 units subcutaneously every morning and 80 units every evening   lovastatin 40 MG tablet Commonly known as:  MEVACOR Take 40 mg by mouth at bedtime.   nitroGLYCERIN 0.4 MG SL tablet Commonly known as:  NITROSTAT Place 0.4 mg under the tongue every 5 (five) minutes as needed for chest pain.   oxyCODONE 5 MG immediate release tablet Commonly known as:  Oxy IR/ROXICODONE Take 1-2 tablets (5-10 mg total) by mouth every 4 (four) hours as needed for moderate pain.   oxyCODONE-acetaminophen 5-325 MG tablet Commonly known as:  PERCOCET/ROXICET Take 1 tablet by mouth every 4 (four) hours as needed.   vitamin B-12 500 MCG tablet Commonly known as:  CYANOCOBALAMIN Take 500 mcg by mouth daily.   Vitamin D (Ergocalciferol) 1.25 MG (50000 UT) Caps capsule Commonly known as:  DRISDOL Take 50,000 Units by mouth once a week.        Follow-up Information    Pabon, [place] F, MD. Schedule an appointment as soon as possible for a visit on 09/28/2018.   Specialty:  General Surgery Why:  s/p lap appy, post-surgical ileus, post-surgical acute renal failure Contact information: [address] [place]  [address] [phone]        Tracie Harrier, MD. Schedule an appointment as soon as possible for a visit in 2 week(s).   Why:  hospitalization for appendicitis developed worsening renal failure post-operatively Contact information: [address] [place] [address] [phone]            Time spent on discharge management including discussion of hospital course, clinical condition, outpatient instructions, prescriptions, and follow up with the patient and members of the medical team: >30 minutes  -- Edison Simon , PA-C [facility]  09/21/2018, 12:12 PM [phone] M-F: 7am - 4pm

## 2018-09-21 NOTE — Progress Notes (Signed)
[facility] Hospital Day(s): 11.   Post op day(s): 13 Days Post-Op.   Interval History: Patient seen and examined, no acute events or new complaints overnight. Patient is very anxious about going home. No complaints of fever, tachycardia, abdominal pain, nausea, or emesis. He continues to tolerate a diet and endorse flatus + BMs. He does have an increasing leukocytosis 21.3K (20K yesterday) but is without any obvious source or signs of infection. Renal function appear to be stabilizing (sCr - 4.24) and has had good u/o (foley removed as potential source of infection). No other complaints.   Review of Systems:  Constitutional: denies fever, chills  Respiratory: denies any shortness of breath  Cardiovascular: denies chest pain or palpitations  Gastrointestinal: denies abdominal pain, N/V, or diarrhea/and bowel function as per interval history Genitourinary: denies dysuria, hematuria, urgency  Vital signs in last 24 hours: [min-max] current  Temp:  [98.5 F (36.9 C)-98.9 F (37.2 C)] 98.8 F (37.1 C) (04/20 0413) Pulse Rate:  [78-81] 81 (04/20 0413) Resp:  [18-19] 18 (04/20 0413) BP: (161-171)/(80-82) 161/82 (04/20 0413) SpO2:  [99 %-100 %] 99 % (04/20 0413)     Height: 6\' 1"  (185.4 cm) Weight: 110.2 kg BMI (Calculated): 32.07   Intake/Output this shift:  Total I/O In: 87.2 [I.V.:44; IV Piggyback:43.2] Out: -      Physical Exam:  Constitutional: alert, cooperative and no distress  Respiratory: breathing non-labored at rest Gastrointestinal: soft, non-tender, and non-distended Integumentary:Laparoscopic incisions are CDI, no erythema or drainage. Ecchymosis periumbilically and suprapubically, improving  Labs:  CBC Latest Ref Rng & Units 09/21/2018 09/20/2018 09/19/2018  WBC 4.0 - 10.5 K/uL 21.3(H) 20.0(H) 15.6(H)  Hemoglobin 13.0 - 17.0 g/dL 9.3(L) 9.3(L) 9.1(L)  Hematocrit 39.0 - 52.0 % 31.0(L) 30.2(L) 29.6(L)  Platelets 150 - 400 K/uL  365 312 307   CMP Latest Ref Rng & Units 09/21/2018 09/20/2018 09/19/2018  Glucose 70 - 99 mg/dL 111(H) 107(H) 156(H)  BUN 8 - 23 mg/dL 64(H) 63(H) 65(H)  Creatinine 0.61 - 1.24 mg/dL 4.24(H) 4.19(H) 4.16(H)  Sodium 135 - 145 mmol/L 141 139 143  Potassium 3.5 - 5.1 mmol/L 4.6 4.1 3.9  Chloride 98 - 111 mmol/L 102 100 101  CO2 22 - 32 mmol/L 27 27 32  Calcium 8.9 - 10.3 mg/dL 8.2(L) 8.0(L) 8.1(L)  Total Protein 6.5 - 8.1 g/dL - - -  Total Bilirubin 0.3 - 1.2 mg/dL - - -  Alkaline Phos 38 - 126 U/L - - -  AST 15 - 41 U/L - - -  ALT 0 - 44 U/L - - -     Imaging studies: No new pertinent imaging studies   Assessment/Plan: (ICD-10's: K35.80) 63 y.o. male still with elevating leukocytosis without fever or obvious source of infection and renal function which appears to now be stabilizing, otherwise doing well 13 Days Post-Op s/p laparoscopic appendectomy for acute appendicitis.   - Continue renal diet             - Continue IV ABx (Zosyn) - renal dosing; Day 13; needs to complete 14 days total (stop on Wednesday - 4/22)             - Monitor abdominal examination; on-going bowel function - Monitor leukocytosis; no obvious source of infection - Monitor renal function; morning BMP              - Appreciate nephrology involvement; no indication for HD at this time; patient should follow-up with his primary  nephrologist upon discharge. - Appreciate medicine'shelp with comorbidities              - Encouraged continued work with PT; recommending HHPT  All of the above findings and recommendations were discussed with the patient, and the medical team, and all of patient's questions were answered to his expressed satisfaction.  -- Edison Simon, PA-C  [facility] 09/21/2018, 8:14 AM [phone] M-F: 7am - 4pm

## 2018-09-22 ENCOUNTER — Emergency Department: Payer: PPO

## 2018-09-22 ENCOUNTER — Inpatient Hospital Stay
Admission: EM | Admit: 2018-09-22 | Discharge: 2018-09-26 | DRG: 919 | Disposition: A | Discharge status: Home Health | Payer: PPO | Attending: Internal Medicine | Admitting: Internal Medicine

## 2018-09-22 ENCOUNTER — Other Ambulatory Visit: Payer: Self-pay

## 2018-09-22 DIAGNOSIS — E1151 Type 2 diabetes mellitus with diabetic peripheral angiopathy without gangrene: Secondary | ICD-10-CM | POA: Diagnosis not present

## 2018-09-22 DIAGNOSIS — T508X5A Adverse effect of diagnostic agents, initial encounter: Secondary | ICD-10-CM | POA: Diagnosis present

## 2018-09-22 DIAGNOSIS — Z794 Long term (current) use of insulin: Secondary | ICD-10-CM

## 2018-09-22 DIAGNOSIS — Z87891 Personal history of nicotine dependence: Secondary | ICD-10-CM | POA: Diagnosis not present

## 2018-09-22 DIAGNOSIS — I130 Hypertensive heart and chronic kidney disease with heart failure and stage 1 through stage 4 chronic kidney disease, or unspecified chronic kidney disease: Secondary | ICD-10-CM | POA: Diagnosis not present

## 2018-09-22 DIAGNOSIS — R809 Proteinuria, unspecified: Secondary | ICD-10-CM | POA: Diagnosis present

## 2018-09-22 DIAGNOSIS — A4151 Sepsis due to Escherichia coli [E. coli]: Secondary | ICD-10-CM | POA: Diagnosis not present

## 2018-09-22 DIAGNOSIS — Z89422 Acquired absence of other left toe(s): Secondary | ICD-10-CM

## 2018-09-22 DIAGNOSIS — G9341 Metabolic encephalopathy: Secondary | ICD-10-CM | POA: Diagnosis present

## 2018-09-22 DIAGNOSIS — Y838 Other surgical procedures as the cause of abnormal reaction of the patient, or of later complication, without mention of misadventure at the time of the procedure: Secondary | ICD-10-CM | POA: Diagnosis present

## 2018-09-22 DIAGNOSIS — N170 Acute kidney failure with tubular necrosis: Secondary | ICD-10-CM | POA: Diagnosis present

## 2018-09-22 DIAGNOSIS — Z8249 Family history of ischemic heart disease and other diseases of the circulatory system: Secondary | ICD-10-CM

## 2018-09-22 DIAGNOSIS — I129 Hypertensive chronic kidney disease with stage 1 through stage 4 chronic kidney disease, or unspecified chronic kidney disease: Secondary | ICD-10-CM | POA: Diagnosis not present

## 2018-09-22 DIAGNOSIS — Z9049 Acquired absence of other specified parts of digestive tract: Secondary | ICD-10-CM | POA: Diagnosis not present

## 2018-09-22 DIAGNOSIS — J45909 Unspecified asthma, uncomplicated: Secondary | ICD-10-CM | POA: Diagnosis present

## 2018-09-22 DIAGNOSIS — I5042 Chronic combined systolic (congestive) and diastolic (congestive) heart failure: Secondary | ICD-10-CM | POA: Diagnosis present

## 2018-09-22 DIAGNOSIS — Z8349 Family history of other endocrine, nutritional and metabolic diseases: Secondary | ICD-10-CM

## 2018-09-22 DIAGNOSIS — A419 Sepsis, unspecified organism: Secondary | ICD-10-CM | POA: Diagnosis not present

## 2018-09-22 DIAGNOSIS — E11649 Type 2 diabetes mellitus with hypoglycemia without coma: Secondary | ICD-10-CM | POA: Diagnosis present

## 2018-09-22 DIAGNOSIS — N141 Nephropathy induced by other drugs, medicaments and biological substances: Secondary | ICD-10-CM | POA: Diagnosis not present

## 2018-09-22 DIAGNOSIS — T8140XA Infection following a procedure, unspecified, initial encounter: Secondary | ICD-10-CM | POA: Diagnosis not present

## 2018-09-22 DIAGNOSIS — K8020 Calculus of gallbladder without cholecystitis without obstruction: Secondary | ICD-10-CM | POA: Diagnosis not present

## 2018-09-22 DIAGNOSIS — K3533 Acute appendicitis with perforation and localized peritonitis, with abscess: Secondary | ICD-10-CM | POA: Diagnosis present

## 2018-09-22 DIAGNOSIS — K651 Peritoneal abscess: Secondary | ICD-10-CM | POA: Diagnosis not present

## 2018-09-22 DIAGNOSIS — R339 Retention of urine, unspecified: Secondary | ICD-10-CM | POA: Diagnosis present

## 2018-09-22 DIAGNOSIS — N183 Chronic kidney disease, stage 3 (moderate): Secondary | ICD-10-CM | POA: Diagnosis present

## 2018-09-22 DIAGNOSIS — K661 Hemoperitoneum: Secondary | ICD-10-CM | POA: Diagnosis present

## 2018-09-22 DIAGNOSIS — K219 Gastro-esophageal reflux disease without esophagitis: Secondary | ICD-10-CM | POA: Diagnosis present

## 2018-09-22 DIAGNOSIS — R0602 Shortness of breath: Secondary | ICD-10-CM | POA: Diagnosis not present

## 2018-09-22 DIAGNOSIS — E1140 Type 2 diabetes mellitus with diabetic neuropathy, unspecified: Secondary | ICD-10-CM | POA: Diagnosis present

## 2018-09-22 DIAGNOSIS — I7025 Atherosclerosis of native arteries of other extremities with ulceration: Secondary | ICD-10-CM

## 2018-09-22 DIAGNOSIS — Z1611 Resistance to penicillins: Secondary | ICD-10-CM | POA: Diagnosis not present

## 2018-09-22 DIAGNOSIS — R600 Localized edema: Secondary | ICD-10-CM | POA: Diagnosis not present

## 2018-09-22 DIAGNOSIS — Z7902 Long term (current) use of antithrombotics/antiplatelets: Secondary | ICD-10-CM

## 2018-09-22 DIAGNOSIS — I2510 Atherosclerotic heart disease of native coronary artery without angina pectoris: Secondary | ICD-10-CM | POA: Diagnosis present

## 2018-09-22 DIAGNOSIS — K91870 Postprocedural hematoma of a digestive system organ or structure following a digestive system procedure: Secondary | ICD-10-CM | POA: Diagnosis not present

## 2018-09-22 DIAGNOSIS — D631 Anemia in chronic kidney disease: Secondary | ICD-10-CM | POA: Diagnosis present

## 2018-09-22 DIAGNOSIS — Z7982 Long term (current) use of aspirin: Secondary | ICD-10-CM

## 2018-09-22 DIAGNOSIS — D62 Acute posthemorrhagic anemia: Secondary | ICD-10-CM | POA: Diagnosis not present

## 2018-09-22 DIAGNOSIS — R6520 Severe sepsis without septic shock: Secondary | ICD-10-CM | POA: Diagnosis not present

## 2018-09-22 DIAGNOSIS — Z833 Family history of diabetes mellitus: Secondary | ICD-10-CM | POA: Diagnosis not present

## 2018-09-22 DIAGNOSIS — R627 Adult failure to thrive: Secondary | ICD-10-CM | POA: Diagnosis not present

## 2018-09-22 DIAGNOSIS — E1122 Type 2 diabetes mellitus with diabetic chronic kidney disease: Secondary | ICD-10-CM | POA: Diagnosis not present

## 2018-09-22 DIAGNOSIS — E860 Dehydration: Secondary | ICD-10-CM | POA: Diagnosis present

## 2018-09-22 DIAGNOSIS — I082 Rheumatic disorders of both aortic and tricuspid valves: Secondary | ICD-10-CM | POA: Diagnosis present

## 2018-09-22 DIAGNOSIS — Z951 Presence of aortocoronary bypass graft: Secondary | ICD-10-CM

## 2018-09-22 DIAGNOSIS — Z79899 Other long term (current) drug therapy: Secondary | ICD-10-CM

## 2018-09-22 DIAGNOSIS — D539 Nutritional anemia, unspecified: Secondary | ICD-10-CM | POA: Diagnosis present

## 2018-09-22 DIAGNOSIS — E119 Type 2 diabetes mellitus without complications: Secondary | ICD-10-CM | POA: Diagnosis not present

## 2018-09-22 DIAGNOSIS — B9620 Unspecified Escherichia coli [E. coli] as the cause of diseases classified elsewhere: Secondary | ICD-10-CM | POA: Diagnosis present

## 2018-09-22 DIAGNOSIS — N179 Acute kidney failure, unspecified: Secondary | ICD-10-CM | POA: Diagnosis not present

## 2018-09-22 HISTORY — DX: Acute appendicitis with perforation, localized peritonitis, and gangrene, with abscess: K35.33

## 2018-09-22 LAB — COMPREHENSIVE METABOLIC PANEL
ALT: 11 U/L (ref 0–44)
AST: 31 U/L (ref 15–41)
Albumin: 2.2 g/dL — ABNORMAL LOW (ref 3.5–5.0)
Alkaline Phosphatase: 50 U/L (ref 38–126)
Anion gap: 10 (ref 5–15)
BUN: 64 mg/dL — ABNORMAL HIGH (ref 8–23)
CO2: 25 mmol/L (ref 22–32)
Calcium: 7.9 mg/dL — ABNORMAL LOW (ref 8.9–10.3)
Chloride: 104 mmol/L (ref 98–111)
Creatinine, Ser: 4.68 mg/dL — ABNORMAL HIGH (ref 0.61–1.24)
GFR calc Af Amer: 14 mL/min — ABNORMAL LOW (ref >60)
GFR calc non Af Amer: 12 mL/min — ABNORMAL LOW (ref >60)
Glucose, Bld: 130 mg/dL — ABNORMAL HIGH (ref 70–99)
Potassium: 4.7 mmol/L (ref 3.5–5.1)
Sodium: 139 mmol/L (ref 135–145)
Total Bilirubin: 1 mg/dL (ref 0.3–1.2)
Total Protein: 6.8 g/dL (ref 6.5–8.1)

## 2018-09-22 LAB — URINALYSIS, COMPLETE (UACMP) WITH MICROSCOPIC
Bacteria, UA: NONE SEEN
Bilirubin Urine: NEGATIVE
Glucose, UA: NEGATIVE mg/dL
Ketones, ur: NEGATIVE mg/dL
Leukocytes,Ua: NEGATIVE
Nitrite: NEGATIVE
Protein, ur: 100 mg/dL — AB
Specific Gravity, Urine: 1.016 (ref 1.005–1.030)
pH: 5 (ref 5.0–8.0)

## 2018-09-22 LAB — CBC WITH DIFFERENTIAL/PLATELET
Abs Immature Granulocytes: 0.15 10*3/uL — ABNORMAL HIGH (ref 0.00–0.07)
Basophils Absolute: 0 10*3/uL (ref 0.0–0.1)
Basophils Relative: 0 %
Eosinophils Absolute: 0.1 10*3/uL (ref 0.0–0.5)
Eosinophils Relative: 1 %
HCT: 24.5 % — ABNORMAL LOW (ref 39.0–52.0)
Hemoglobin: 7.5 g/dL — ABNORMAL LOW (ref 13.0–17.0)
Immature Granulocytes: 1 %
Lymphocytes Relative: 7 %
Lymphs Abs: 1.2 10*3/uL (ref 0.7–4.0)
MCH: 27.7 pg (ref 26.0–34.0)
MCHC: 30.6 g/dL (ref 30.0–36.0)
MCV: 90.4 fL (ref 80.0–100.0)
Monocytes Absolute: 1 10*3/uL (ref 0.1–1.0)
Monocytes Relative: 6 %
Neutro Abs: 13.9 10*3/uL — ABNORMAL HIGH (ref 1.7–7.7)
Neutrophils Relative %: 85 %
Platelets: 292 10*3/uL (ref 150–400)
RBC: 2.71 MIL/uL — ABNORMAL LOW (ref 4.22–5.81)
RDW: 16.9 % — ABNORMAL HIGH (ref 11.5–15.5)
WBC: 16.3 10*3/uL — ABNORMAL HIGH (ref 4.0–10.5)
nRBC: 0 % (ref 0.0–0.2)

## 2018-09-22 LAB — LACTIC ACID, PLASMA: Lactic Acid, Venous: 1.2 mmol/L (ref 0.5–1.9)

## 2018-09-22 LAB — APTT: aPTT: 45 seconds — ABNORMAL HIGH (ref 24–36)

## 2018-09-22 LAB — PROTIME-INR
INR: 1.3 — ABNORMAL HIGH (ref 0.8–1.2)
Prothrombin Time: 15.6 seconds — ABNORMAL HIGH (ref 11.4–15.2)

## 2018-09-22 LAB — GLUCOSE, CAPILLARY: Glucose-Capillary: 95 mg/dL (ref 70–99)

## 2018-09-22 IMAGING — CT CT ABDOMEN AND PELVIS WITHOUT CONTRAST
2 of 4 series · 16 of 46 positions shown, 18 images · non-contrast
Comparison: CT scan of [DATE].

CLINICAL DATA: Fever status post appendectomy last week.

EXAM:
CT ABDOMEN AND PELVIS WITHOUT CONTRAST
TECHNIQUE: Multidetector CT imaging of the abdomen and pelvis was performed
following the standard protocol without IV contrast.

[Series 2: routine abd/pel wo · axial · 0.79mm/px · z∈[-516,-41]mm · 13 of 105 slices shown, 15 images]
[im 5/105  soft-tissue]
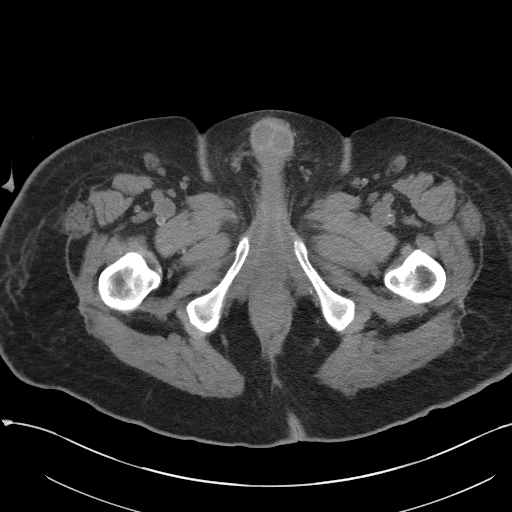
[im 5/105  bone]
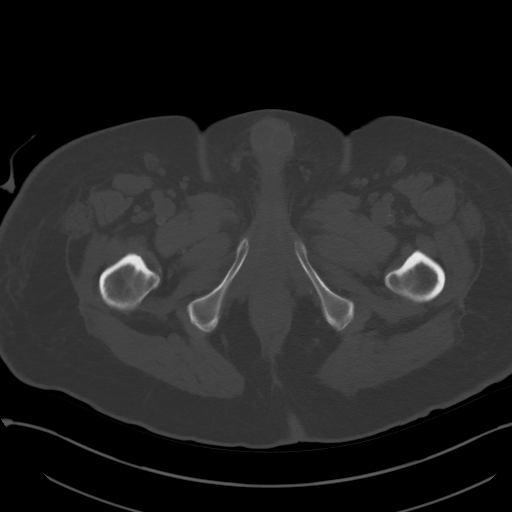
[im 14/105  soft-tissue]
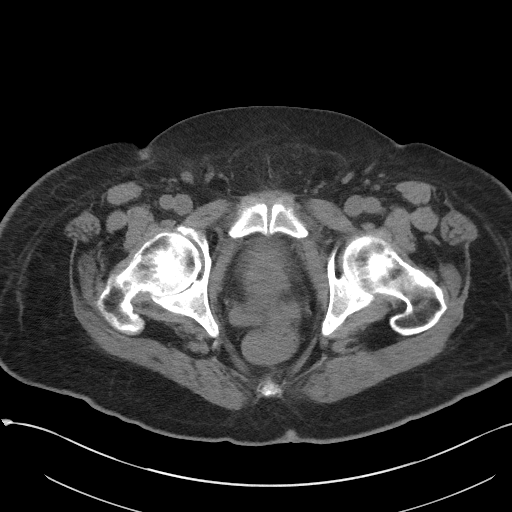
[im 22/105  soft-tissue]
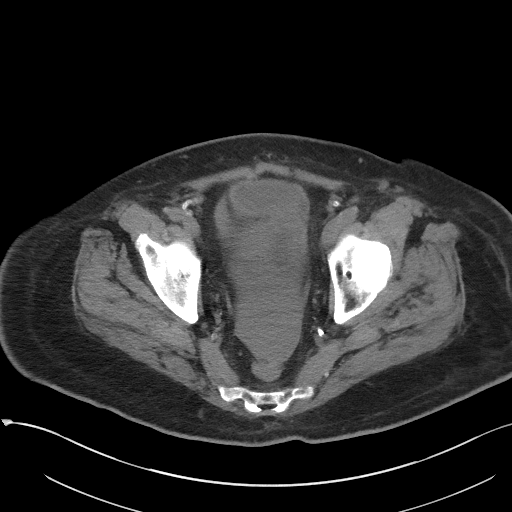
[im 31/105  soft-tissue]
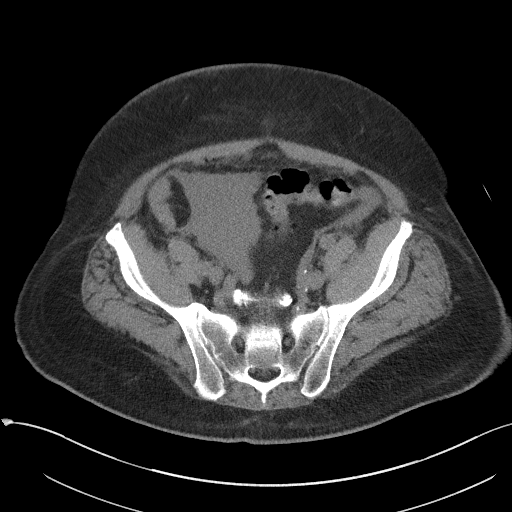
[im 35/105  soft-tissue]
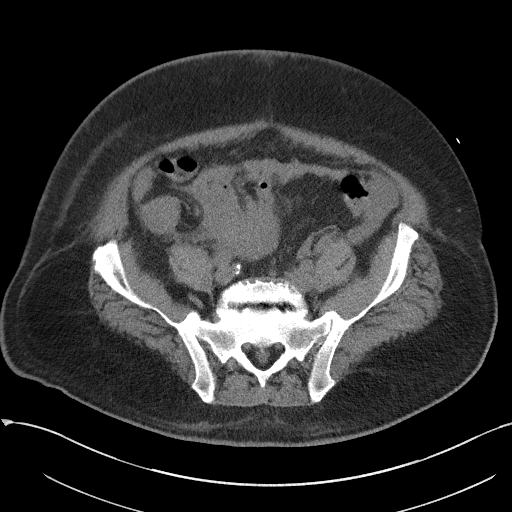
[im 44/105  soft-tissue]
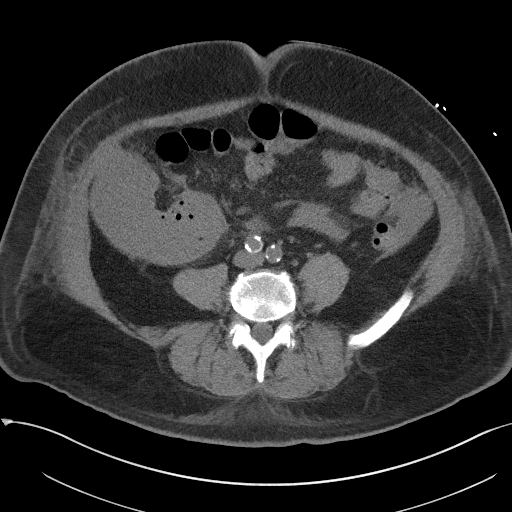
[im 53/105  soft-tissue]
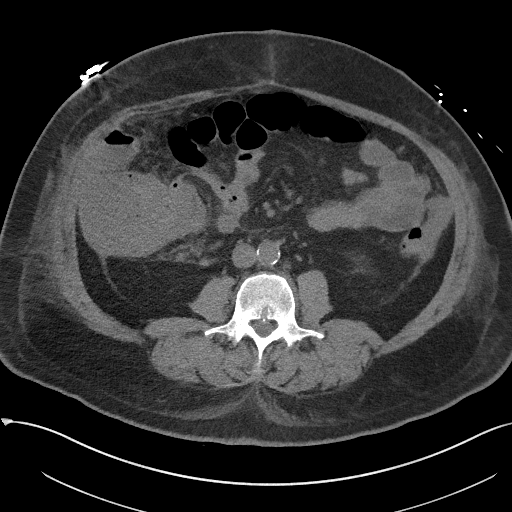
[im 61/105  soft-tissue]
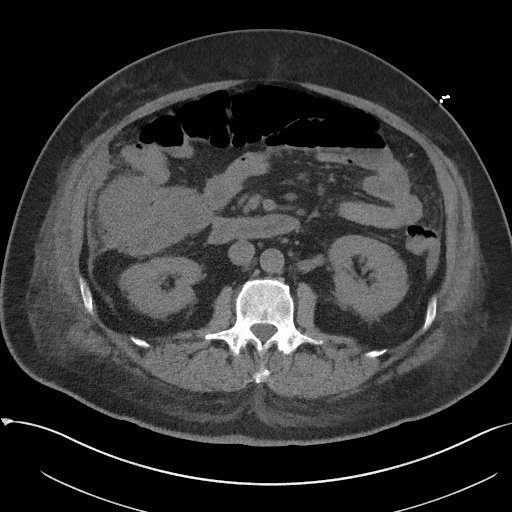
[im 70/105  soft-tissue]
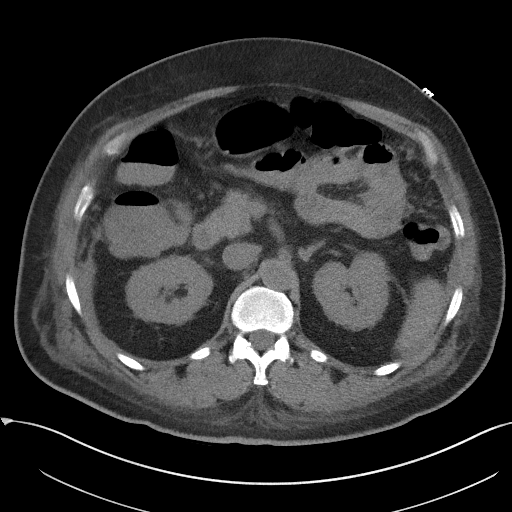
[im 70/105  bone]
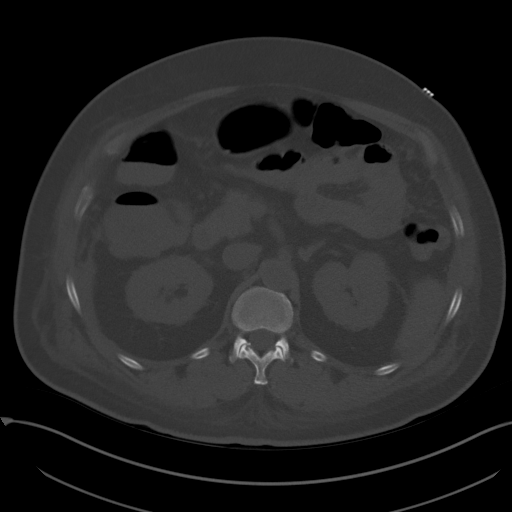
[im 74/105  soft-tissue]
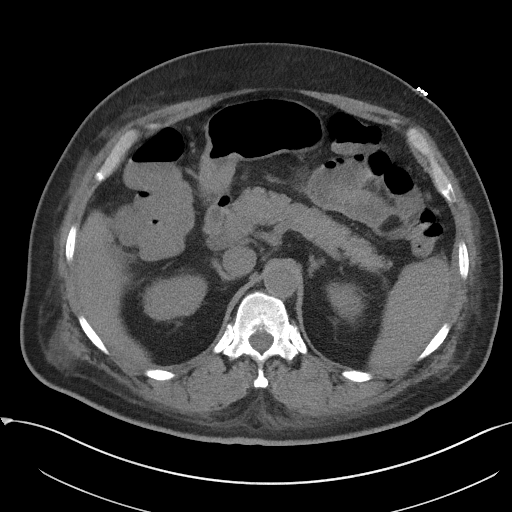
[im 83/105  soft-tissue]
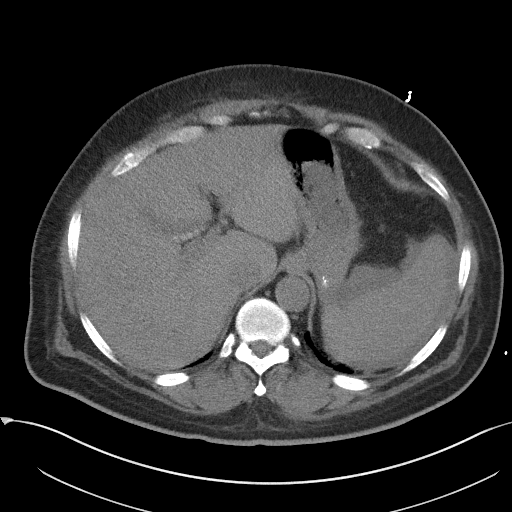
[im 92/105  soft-tissue]
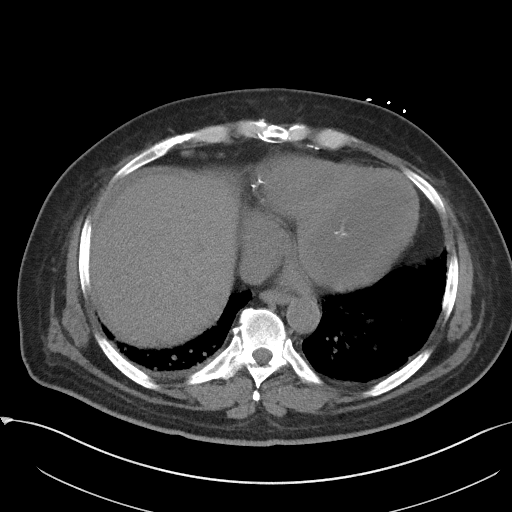
[im 100/105  soft-tissue]
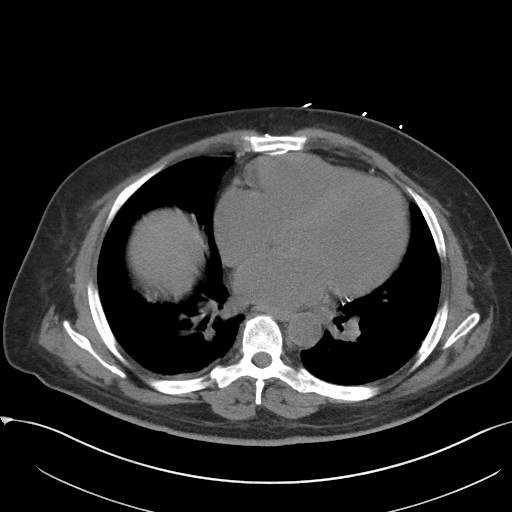

[Series 5: coronal st · coronal · 0.85mm/px · 3 of 113 slices shown]
[im 38/113  soft-tissue]
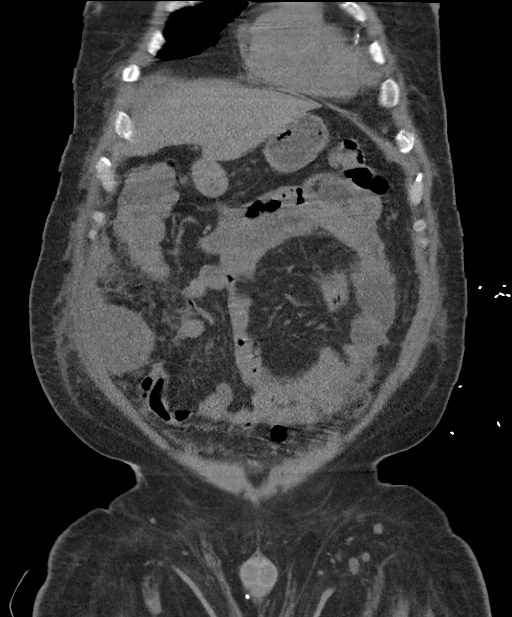
[im 50/113  soft-tissue]
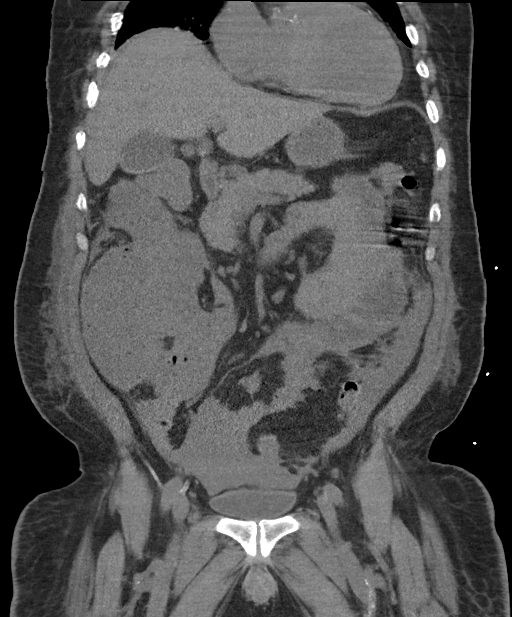
[im 63/113  soft-tissue]
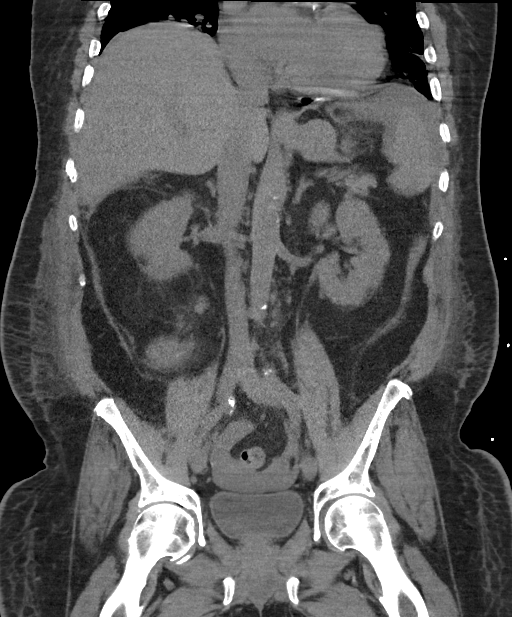

[16 of 46 positions shown; findings below may reference images not displayed]

FINDINGS: Lower chest: No acute abnormality.

Hepatobiliary: Cholelithiasis is noted. No biliary dilatation is
noted. Stable small amount of free fluid is noted around the liver.
No focal hepatic abnormality is noted on these unenhanced images.

Pancreas: Unremarkable. No pancreatic ductal dilatation or
surrounding inflammatory changes.

Spleen: There is interval development of mild amount of fluid around
the spleen which demonstrates Hounsfield measurement of 22
concerning for possible hemorrhage.

Adrenals/Urinary Tract: Adrenal glands are unremarkable. Kidneys are
normal, without renal calculi, focal lesion, or hydronephrosis.
Bladder is unremarkable.

Stomach/Bowel: The stomach appears normal. Status post appendectomy.
There is no evidence of bowel obstruction. There is noted 10.7 x
cm abnormality in the right lower quadrant concerning for abscess or
hematoma. No definite bowel dilatation is noted.

Vascular/Lymphatic: Aortic atherosclerosis. No enlarged abdominal or
pelvic lymph nodes.

Reproductive: Prostate is unremarkable.

Other: No abnormal hernia is noted. There is also noted stable
hyperdense fluid in the pelvis.

Musculoskeletal: Stable grade 1 anterolisthesis of L5-S1 is noted
secondary to bilateral L5 pars defects.
IMPRESSION: Interval development of 10.7 x 6.9 cm probable abscess or less
likely hematoma seen in the right lower quadrant of the abdomen.
Stable amount of probable intraperitoneal hemorrhage is noted in the
pelvis and around the liver, with new fluid noted around the spleen.
Critical Value/emergent results were called by telephone at the time
of interpretation on [DATE] at [DATE] to Dr. YOMAIRA ,
who verbally acknowledged these results.

Cholelithiasis is noted.

Aortic Atherosclerosis ([Q8]-[Q8]).

## 2018-09-22 IMAGING — DX PORTABLE CHEST - 1 VIEW
1 series · 1 of 1 positions shown · non-contrast
Comparison: [DATE] and earlier.

CLINICAL DATA: Laparoscopic appendectomy on [DATE]. Patient
presents with fever, shortness of breath and acute mental status
changes that began yesterday. Former smoker.

EXAM:
PORTABLE CHEST 1 VIEW

[chest ap]
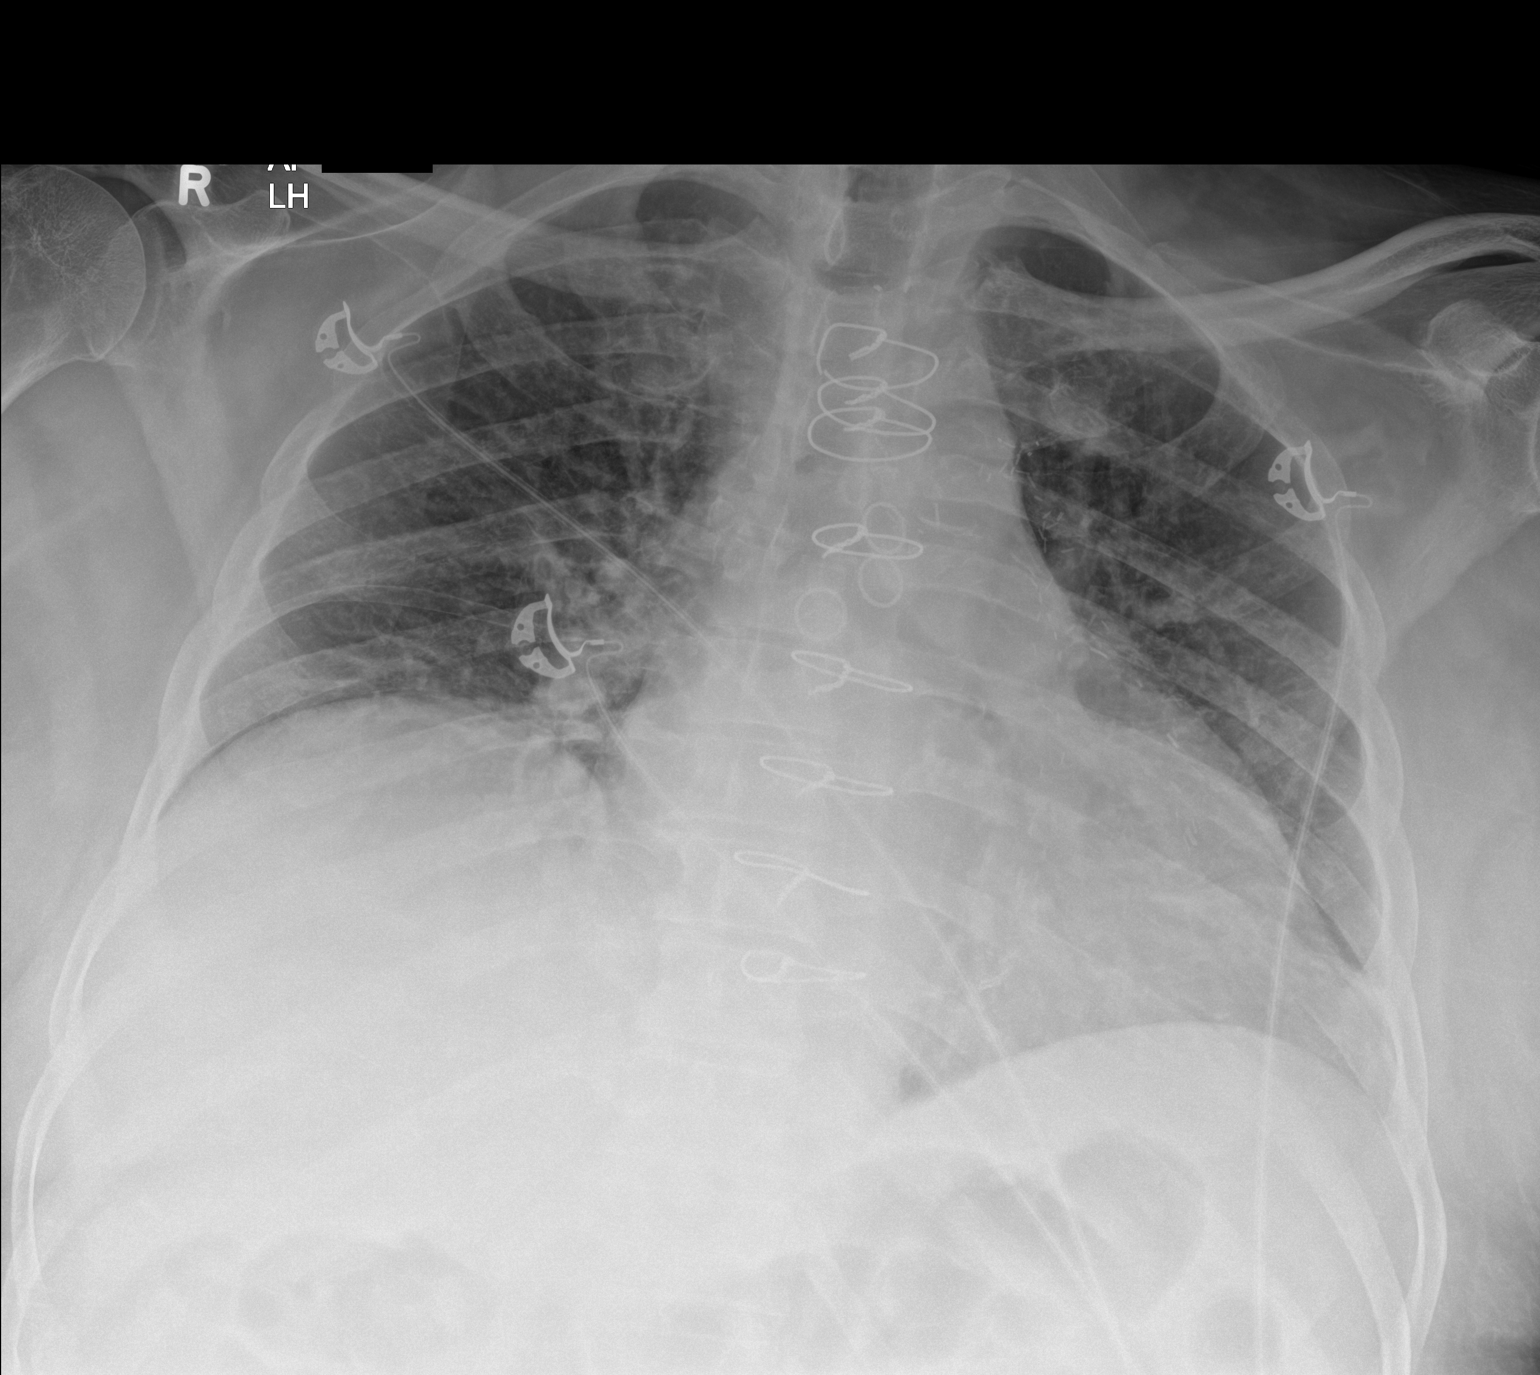

[1 of 1 positions shown; findings below may reference images not displayed]

FINDINGS: Sternotomy for CABG. Cardiac silhouette moderately enlarged. Chronic
elevation of the RIGHT hemidiaphragm and chronic scar/atelectasis at
the RIGHT lung base. Pulmonary venous hypertension and minimal to
mild interstitial pulmonary edema, new since the prior exams. No
confluent airspace consolidation. No visible pleural effusions.
IMPRESSION: Minimal to mild CHF, with moderate cardiomegaly and minimal to mild
diffuse interstitial pulmonary edema.

## 2018-09-22 MED ORDER — INSULIN ASPART 100 UNIT/ML ~~LOC~~ SOLN: 0.0000 [IU] | Freq: Three times a day (TID) | SUBCUTANEOUS | Status: DC

## 2018-09-22 MED ORDER — HYDRALAZINE HCL 20 MG/ML IJ SOLN: 10.0000 mg | INTRAMUSCULAR | Status: DC | PRN

## 2018-09-22 MED ORDER — SODIUM CHLORIDE 0.9 % IV SOLN
INTRAVENOUS | Status: DC
  Administered 2018-09-22: 20:00:00 via INTRAVENOUS

## 2018-09-22 MED ORDER — ACETAMINOPHEN 650 MG RE SUPP
650.0000 mg | Freq: Once | RECTAL | Status: AC
  Administered 2018-09-22: 15:00:00 650 mg via RECTAL
  Filled 2018-09-22 (×2): qty 1

## 2018-09-22 MED ORDER — ONDANSETRON HCL 4 MG/2ML IJ SOLN: 4.0000 mg | Freq: Four times a day (QID) | INTRAMUSCULAR | Status: DC | PRN

## 2018-09-22 MED ORDER — SODIUM CHLORIDE 0.9 % IV SOLN
1.0000 g | Freq: Once | INTRAVENOUS | Status: AC
  Administered 2018-09-22: 15:00:00 1 g via INTRAVENOUS

## 2018-09-22 MED ORDER — MORPHINE SULFATE (PF) 2 MG/ML IV SOLN: 1.0000 mg | INTRAVENOUS | Status: DC | PRN

## 2018-09-22 MED ORDER — PIPERACILLIN-TAZOBACTAM 3.375 G IVPB 30 MIN: 3.3750 g | Freq: Once | INTRAVENOUS | Status: DC

## 2018-09-22 MED ORDER — ALBUMIN HUMAN 25 % IV SOLN
12.5000 g | Freq: Once | INTRAVENOUS | Status: AC
  Administered 2018-09-22: 22:00:00 12.5 g via INTRAVENOUS
  Filled 2018-09-22: qty 50

## 2018-09-22 MED ORDER — ACETAMINOPHEN 500 MG PO TABS: 1000.0000 mg | ORAL_TABLET | Freq: Four times a day (QID) | ORAL | Status: DC | PRN

## 2018-09-22 MED ORDER — ALBUMIN HUMAN 25 % IV SOLN
12.5000 g | Freq: Once | INTRAVENOUS | Status: AC
  Administered 2018-09-22: 21:00:00 12.5 g via INTRAVENOUS
  Filled 2018-09-22 (×2): qty 50

## 2018-09-22 MED ORDER — CARVEDILOL 6.25 MG PO TABS
6.2500 mg | ORAL_TABLET | Freq: Two times a day (BID) | ORAL | Status: DC
  Administered 2018-09-23 – 2018-09-26 (×7): 6.25 mg via ORAL
  Filled 2018-09-22 (×7): qty 1

## 2018-09-22 MED ORDER — PIPERACILLIN-TAZOBACTAM 3.375 G IVPB
3.3750 g | Freq: Three times a day (TID) | INTRAVENOUS | Status: DC
  Administered 2018-09-22: 21:00:00 3.375 g via INTRAVENOUS
  Filled 2018-09-22: qty 50

## 2018-09-22 MED ORDER — ONDANSETRON 4 MG PO TBDP: 4.0000 mg | ORAL_TABLET | Freq: Four times a day (QID) | ORAL | Status: DC | PRN

## 2018-09-22 MED ORDER — SODIUM CHLORIDE 0.9 % IV BOLUS
500.0000 mL | Freq: Once | INTRAVENOUS | Status: AC
  Administered 2018-09-22: 17:00:00 500 mL via INTRAVENOUS

## 2018-09-22 MED ORDER — OXYCODONE HCL 5 MG PO TABS: 5.0000 mg | ORAL_TABLET | ORAL | Status: DC | PRN

## 2018-09-22 MED ORDER — INSULIN ASPART 100 UNIT/ML ~~LOC~~ SOLN: 3.0000 [IU] | Freq: Three times a day (TID) | SUBCUTANEOUS | Status: DC

## 2018-09-22 MED ORDER — PANTOPRAZOLE SODIUM 40 MG IV SOLR
40.0000 mg | Freq: Every day | INTRAVENOUS | Status: DC
  Administered 2018-09-22 – 2018-09-25 (×4): 40 mg via INTRAVENOUS
  Filled 2018-09-22 (×4): qty 40

## 2018-09-22 MED ORDER — ACETAMINOPHEN 500 MG PO TABS
1000.0000 mg | ORAL_TABLET | Freq: Four times a day (QID) | ORAL | Status: DC
  Administered 2018-09-22 – 2018-09-26 (×10): 1000 mg via ORAL
  Filled 2018-09-22 (×12): qty 2

## 2018-09-22 MED ORDER — METRONIDAZOLE IN NACL 5-0.79 MG/ML-% IV SOLN
500.0000 mg | Freq: Once | INTRAVENOUS | Status: AC
  Administered 2018-09-22: 500 mg via INTRAVENOUS

## 2018-09-22 MED ORDER — INSULIN ASPART 100 UNIT/ML ~~LOC~~ SOLN: 0.0000 [IU] | Freq: Every day | SUBCUTANEOUS | Status: DC

## 2018-09-22 NOTE — ED Notes (Signed)
Wife called wanting updated. Neoma Laming, wife, [phone]. She stated he had a temp of 100.8 at home. No tylenol or motrin today.

## 2018-09-22 NOTE — ED Notes (Signed)
Wife notified of bed assignment 53

## 2018-09-22 NOTE — Consult Note (Signed)
Patient Demographics  Phillip [name], is a 63 y.o. male   MRN: [id number]   DOB - [birth year]  Admit Date - 09/22/2018    Outpatient Primary MD for the patient is Raina Mina., MD  Consult requested in the Hospital by Jules Husbands, MD, On 09/22/2018    Reason for consult ; medical management   HPI: 63 year old male patient who was discharged yesterday home after prolonged hospitalization significant for gangrenous appendicitis comes back from home because of fever, altered mental status.  Family called 911 because of syncopal episode, when EMS arrived blood sugar is 152, noticed that patient was talking out of his head, patient noted to have fever.  Found to have appendicular abscess, admitted to surgical service, medicine is consulted for medical management of medical problems of heart failure, diabetes, renal failure..  Patient was very dehydrated when he came, received 500 mL of fluid, IV antibiotics.  According to registered nurse patient was very lethargic when he came but after IV fluids patient became more alert.  During my visit patient was alert, awake, oriented, following commands.  Knows he is in the hospital, denies any abdominal pain.  Patient had fever up to 101 Fahrenheit when he came, received Tylenol.  Denies any chest pain or shortness of breath.  Past Medical History:  Diagnosis Date  . Asthma   . CHF (congestive heart failure) ([place])   . Coronary artery disease   . Diabetes mellitus without complication ([place])   . Foot ulcer ([place])    left foot  . GERD (gastroesophageal reflux disease)   . Heart murmur   . Hypertension   . Kidney insufficiency   . Peripheral vascular disease [facility])       Past Surgical History:  Procedure Laterality Date  . AMPUTATION TOE Left 03/13/2018   Procedure: AMPUTATION  TOE-MPJ;  Surgeon: Samara Deist, DPM;  Location: [facility] ORS;  Service: Podiatry;  Laterality: Left;  . ANGIOPLASTY    . CARDIAC CATHETERIZATION    . CARDIAC SURGERY     heart bypass (4)  . CORONARY ARTERY BYPASS GRAFT    . LAPAROSCOPIC APPENDECTOMY N/A 09/08/2018   Procedure: APPENDECTOMY LAPAROSCOPIC;  Surgeon: Jules Husbands, MD;  Location: [facility] ORS;  Service: General;  Laterality: N/A;  . LOWER EXTREMITY ANGIOGRAPHY Left 03/02/2018   Procedure: LOWER EXTREMITY ANGIOGRAPHY;  Surgeon: Algernon Huxley, MD;  Location: [facility];  Service: Cardiovascular;  Laterality: Left;  . ROTATOR CUFF REPAIR Left     in for   Chief Complaint  Patient presents with  . Fever  . Post-op Problem        Review of Systems     Had fever, chills. No Headache, No changes with Vision or hearing, No problems swallowing food or Liquids, No Chest pain, Cough or Shortness of Breath, No Abdominal pain, No Nausea or Vommitting, Bowel movements are regular, No Blood in stool or Urine, No dysuria, No new skin rashes or bruises, No new joints pains-aches,  No new weakness, tingling, numbness in any extremity, No recent weight gain or loss, No polyuria, polydypsia or polyphagia, No significant Mental Stressors.  A full 10 point Review of Systems was done, except as stated above, all other Review of Systems were negative.   Social History Social History   Tobacco Use  . Smoking status: Former Smoker    Last attempt to quit: 2003    Years since quitting: 17.3  . Smokeless tobacco: Never Used  Substance Use Topics  . Alcohol use: Not Currently    Comment: 2003    Family History Family History  Problem Relation Age of Onset  . Hyperlipidemia Mother   . Hypertension Mother   . Diabetes Mother   . Heart attack Brother   . Heart disease Brother      Prior to Admission medications   Medication Sig Start Date End Date Taking? Authorizing Provider  acetaminophen (TYLENOL) 500 MG tablet  Take 1,000 mg by mouth every 6 (six) hours as needed for moderate pain or headache.    Yes [provider]  amoxicillin-clavulanate (AUGMENTIN) 875-125 MG tablet Take 1 tablet by mouth 2 (two) times daily for 2 days. 09/21/18 09/23/18 Yes Tylene Fantasia, PA-C  aspirin 81 MG tablet Take 81 mg by mouth daily.   Yes [provider]  carvedilol (COREG) 6.25 MG tablet Take 6.25 mg by mouth 2 (two) times daily with a meal.  11/14/15  Yes [provider]  cloNIDine (CATAPRES) 0.3 MG tablet Take 0.3 mg by mouth 2 (two) times daily.    Yes [provider]  clopidogrel (PLAVIX) 75 MG tablet Take 1 tablet (75 mg total) by mouth daily. 03/02/18  Yes Dew, Erskine Squibb, MD  gabapentin (NEURONTIN) 600 MG tablet Take 600 mg by mouth 2 (two) times daily.    Yes [provider]  glipiZIDE (GLUCOTROL XL) 10 MG 24 hr tablet Take 10 mg by mouth daily with breakfast.   Yes [provider]  insulin detemir (LEVEMIR) 100 UNIT/ML injection Inject 40-80 Units into the skin at bedtime. Inject 40 units subcutaneously every morning and 80 units every evening   Yes [provider]  isosorbide mononitrate (IMDUR) 120 MG 24 hr tablet Take 120 mg by mouth daily.   Yes [provider]  lovastatin (MEVACOR) 40 MG tablet Take 40 mg by mouth at bedtime.   Yes [provider]  nitroGLYCERIN (NITROSTAT) 0.4 MG SL tablet Place 0.4 mg under the tongue every 5 (five) minutes as needed for chest pain.    Yes [provider]  oxyCODONE (OXY IR/ROXICODONE) 5 MG immediate release tablet Take 1-2 tablets (5-10 mg total) by mouth every 4 (four) hours as needed for moderate pain. 09/21/18  Yes Tylene Fantasia, PA-C    Anti-infectives (From admission, onward)   Start     Dose/Rate Route Frequency Ordered Stop   09/22/18 1845  piperacillin-tazobactam (ZOSYN) IVPB 3.375 g     3.375 g 12.5 mL/hr over 240 Minutes Intravenous Every 8 hours 09/22/18 1831     09/22/18  1430  piperacillin-tazobactam (ZOSYN) IVPB 3.375 g  Status:  Discontinued     3.375 g 100 mL/hr over 30 Minutes Intravenous  Once 09/22/18 1428 09/22/18 1429   09/22/18 1430  cefTRIAXone (ROCEPHIN) 1 g in sodium chloride 0.9 % 100 mL IVPB     1 g 200 mL/hr over 30 Minutes Intravenous  Once 09/22/18 1429 09/22/18 1508   09/22/18 1430  metroNIDAZOLE (FLAGYL) IVPB 500  mg     500 mg 100 mL/hr over 60 Minutes Intravenous  Once 09/22/18 1429 09/22/18 1610      Scheduled Meds: . acetaminophen  1,000 mg Oral Q6H  . [START ON 09/23/2018] carvedilol  6.25 mg Oral BID WC  . insulin aspart  0-15 Units Subcutaneous TID WC  . insulin aspart  0-5 Units Subcutaneous QHS  . [START ON 09/23/2018] insulin aspart  3 Units Subcutaneous TID WC  . pantoprazole (PROTONIX) IV  40 mg Intravenous QHS   Continuous Infusions: . sodium chloride    . albumin human    . piperacillin-tazobactam (ZOSYN)  IV     PRN Meds:.acetaminophen, hydrALAZINE, morphine injection, ondansetron **OR** ondansetron (ZOFRAN) IV, oxyCODONE  No Known Allergies  Physical Exam  Vitals  Blood pressure 121/70, pulse 88, temperature 98.8 F (37.1 C), temperature source Oral, resp. rate 16, height 6\' 3"  (1.905 m), weight 108.9 kg, SpO2 95 %.   1.  Alert, awake, oriented.  2. Normal affect and insight, Not Suicidal or Homicidal, Awake Alert, Oriented X 3.  3. No F.N deficits, ALL C.Nerves Intact, Strength 5/5 all 4 extremities, Sensation intact all 4 extremities, Plantars down going.  4. Ears and Eyes appear Normal, Conjunctivae clear, PERRLA. Moist Oral Mucosa.  5. Supple Neck, No JVD, No cervical lymphadenopathy appriciated, No Carotid Bruits.  6. Symmetrical Chest wall movement, Good air movement bilaterally, CTAB.  7. RRR, No Gallops, Rubs or Murmurs, No Parasternal Heave.  8. P abdomen distended, bowel sounds diminished, no tenderness to palpation during my visit..  9.  No Cyanosis, Normal Skin Turgor, No Skin Rash or  Bruise.  10. Good muscle tone,  joints appear normal , no effusions, Normal ROM.  11. No Palpable Lymph Nodes in Neck or Axillae    Data Review  CBC Recent Labs  Lab 09/18/18 0549 09/19/18 0445 09/20/18 0529 09/21/18 0347 09/22/18 1313  WBC 17.1* 15.6* 20.0* 21.3* 16.3*  HGB 9.5* 9.1* 9.3* 9.3* 7.5*  HCT 30.6* 29.6* 30.2* 31.0* 24.5*  PLT 323 307 312 365 292  MCV 88.2 90.8 90.7 91.7 90.4  MCH 27.4 27.9 27.9 27.5 27.7  MCHC 31.0 30.7 30.8 30.0 30.6  RDW 17.1* 17.1* 17.0* 16.9* 16.9*  LYMPHSABS  --   --   --   --  1.2  MONOABS  --   --   --   --  1.0  EOSABS  --   --   --   --  0.1  BASOSABS  --   --   --   --  0.0   ------------------------------------------------------------------------------------------------------------------  Chemistries  Recent Labs  Lab 09/16/18 0449  09/18/18 0549 09/19/18 0445 09/20/18 0529 09/21/18 0347 09/22/18 1313  NA 142   < > 140 143 139 141 139  K 3.0*   < > 3.6 3.9 4.1 4.6 4.7  CL 90*   < > 96* 101 100 102 104  CO2 37*   < > 31 32 27 27 25   GLUCOSE 159*   < > 150* 156* 107* 111* 130*  BUN 81*   < > 72* 65* 63* 64* 64*  CREATININE 4.85*   < > 4.49* 4.16* 4.19* 4.24* 4.68*  CALCIUM 7.9*   < > 8.0* 8.1* 8.0* 8.2* 7.9*  MG  --   --  2.1  --  2.1  --   --   AST 25  --   --   --   --   --  31  ALT  16  --   --   --   --   --  11  ALKPHOS 47  --   --   --   --   --  50  BILITOT 1.6*  --   --   --   --   --  1.0   < > = values in this interval not displayed.   ------------------------------------------------------------------------------------------------------------------ estimated creatinine clearance is 21.5 mL/min (A) (by C-G formula based on SCr of 4.68 mg/dL (H)). ------------------------------------------------------------------------------------------------------------------ No results for input(s): TSH, T4TOTAL, T3FREE, THYROIDAB in the last 72 hours.  Invalid input(s): FREET3   Coagulation profile Recent Labs  Lab  09/22/18 1742  INR 1.3*   ------------------------------------------------------------------------------------------------------------------- No results for input(s): DDIMER in the last 72 hours. -------------------------------------------------------------------------------------------------------------------  Cardiac Enzymes No results for input(s): CKMB, TROPONINI, MYOGLOBIN in the last 168 hours.  Invalid input(s): CK ------------------------------------------------------------------------------------------------------------------ Invalid input(s): POCBNP   ---------------------------------------------------------------------------------------------------------------  Urinalysis    Component Value Date/Time   COLORURINE YELLOW (A) 09/22/2018 1748   APPEARANCEUR HAZY (A) 09/22/2018 1748   LABSPEC 1.016 09/22/2018 1748   PHURINE 5.0 09/22/2018 1748   GLUCOSEU NEGATIVE 09/22/2018 1748   HGBUR SMALL (A) 09/22/2018 1748   BILIRUBINUR NEGATIVE 09/22/2018 1748   [place] 09/22/2018 1748   PROTEINUR 100 (A) 09/22/2018 1748   NITRITE NEGATIVE 09/22/2018 1748   LEUKOCYTESUR NEGATIVE 09/22/2018 1748     Imaging results:   Ct Abdomen Pelvis Wo Contrast  Result Date: 09/22/2018 CLINICAL DATA:  Fever status post appendectomy last week. EXAM: CT ABDOMEN AND PELVIS WITHOUT CONTRAST TECHNIQUE: Multidetector CT imaging of the abdomen and pelvis was performed following the standard protocol without IV contrast. COMPARISON:  CT scan of September 15, 2018. FINDINGS: Lower chest: No acute abnormality. Hepatobiliary: Cholelithiasis is noted. No biliary dilatation is noted. Stable small amount of free fluid is noted around the liver. No focal hepatic abnormality is noted on these unenhanced images. Pancreas: Unremarkable. No pancreatic ductal dilatation or surrounding inflammatory changes. Spleen: There is interval development of mild amount of fluid around the spleen which demonstrates  Hounsfield measurement of 22 concerning for possible hemorrhage. Adrenals/Urinary Tract: Adrenal glands are unremarkable. Kidneys are normal, without renal calculi, focal lesion, or hydronephrosis. Bladder is unremarkable. Stomach/Bowel: The stomach appears normal. Status post appendectomy. There is no evidence of bowel obstruction. There is noted 10.7 x 6.9 cm abnormality in the right lower quadrant concerning for abscess or hematoma. No definite bowel dilatation is noted. Vascular/Lymphatic: Aortic atherosclerosis. No enlarged abdominal or pelvic lymph nodes. Reproductive: Prostate is unremarkable. Other: No abnormal hernia is noted. There is also noted stable hyperdense fluid in the pelvis. Musculoskeletal: Stable grade 1 anterolisthesis of L5-S1 is noted secondary to bilateral L5 pars defects. IMPRESSION: Interval development of 10.7 x 6.9 cm probable abscess or less likely hematoma seen in the right lower quadrant of the abdomen. Stable amount of probable intraperitoneal hemorrhage is noted in the pelvis and around the liver, with new fluid noted around the spleen. Critical Value/emergent results were called by telephone at the time of interpretation on 09/22/2018 at 2:20 pm to Dr. Merlyn Lot , who verbally acknowledged these results. Cholelithiasis is noted. Aortic Atherosclerosis (ICD10-I70.0). Electronically Signed   By: Marijo Conception M.D.   On: 09/22/2018 14:21   Dg Chest Portable 1 View  Result Date: 09/22/2018 CLINICAL DATA:  Laparoscopic appendectomy on 09/08/2018. Patient presents with fever, shortness of breath and acute mental status changes that began yesterday. Former smoker. EXAM: PORTABLE CHEST 1  VIEW COMPARISON:  09/15/2018 and earlier. FINDINGS: Sternotomy for CABG. Cardiac silhouette moderately enlarged. Chronic elevation of the RIGHT hemidiaphragm and chronic scar/atelectasis at the RIGHT lung base. Pulmonary venous hypertension and minimal to mild interstitial pulmonary edema, new  since the prior exams. No confluent airspace consolidation. No visible pleural effusions. IMPRESSION: Minimal to mild CHF, with moderate cardiomegaly and minimal to mild diffuse interstitial pulmonary edema. Electronically Signed   By: Evangeline Dakin M.D.   On: 09/22/2018 13:32      Assessment & Plan  Active Problems:   Abscess of appendix    36.  63 year old male with multiple medical problems of recent acute appendicitis status post surgery, diabetes mellitus type 2, hypertension, chronic diastolic heart failure comes today with altered mental status, fever, just discharged yesterday now has appendicular abscess.   #1 appendicular abscess with evidence of fever, lethargy, admitted to surgery, continue IV antibiotics, patient needs incision and drainage, admitted to surgical unit. Recent admission significant for gangrenous appendicitis status post appendectomy patient postoperative course complicated by failure to thrive, renal disease, heart failure, diabetes mellitus  .2.acute on chronic anemia, patient recent hemoglobin was 9.3 yesterday, today is 7.5, concerning for intra-abdominal hematoma, transfuse 1 unit of packed RBC, surgery is following closely, avoid anticoagulation. 3.  Acute renal failure on chronic kidney disease stage III, followed by nephrology on recent admission, recommended patient followed by nephrology again during this hospitalization.  Continue to monitor urine output, 4.  Diabetes mellitus type 2, not well controlled, continue sliding scale insulin with coverage, Levemir, 5.,  History of chronic systolic heart failure, CAD, continue beta-blockers with Coreg, avoid diuretics due to renal failure and also aspirin because of possible hematoma, abdomen anemia.Marland Kitchen 6 /metabolic encephalopathy: Improved likely due to dehydration,  impending sepsis with appendicular abscess.  Patient is alert after getting IV fluids, IV antibiotics. Prognosis poor due to multiple medical  problems, thank you for asking [place] to see the patient.  Will follow.  Total time spent on this consult 50 minutes  Family Communication:; Dr. Perrin Maltese discussed with family   Thank you for the consult, we will follow the patient with you in the Hospital.   Epifanio Lesches M.D on 09/22/2018 at 6:57 PM

## 2018-09-22 NOTE — ED Notes (Signed)
Dr. Pabon at bedside.  

## 2018-09-22 NOTE — ED Notes (Signed)
Attempted to reach patient's wife to obtain consent for blood transfusion. Will attempt again

## 2018-09-22 NOTE — ED Notes (Signed)
Received verbal consent for blood transfusion from [facility], patient's wife

## 2018-09-22 NOTE — Progress Notes (Signed)
Patient has antibodies that need to be identified prior to transfusion. Blood just arrived to the blood bank and now they will identify the antibody and give a conditional release. Blood bank will call us when the blood is ready to transfuse.

## 2018-09-22 NOTE — ED Notes (Signed)
Patient awake, oriented to person and time. Remains slightly lethargic

## 2018-09-22 NOTE — ED Notes (Signed)
X-ray at bedside

## 2018-09-22 NOTE — ED Notes (Signed)
ED TO INPATIENT HANDOFF REPORT  ED Nurse Name and Phone #: Anda Kraft [phone]  S Name/Age/Gender Phillip [name] 63 y.o. male Room/Bed: ED32A/ED32A  Code Status   Code Status: Prior  Home/SNF/Other Home Patient oriented to: self Is this baseline? No   Triage Complete: Triage complete  Chief Complaint sob ems  Triage Note Pt arrives [facility] from home for fever after having an appendectomy. Family called 4 originally for syncopal episode. CBG 152. EMS reports that family reported pt has "talking out of his head." 96% on RA. VSS with EMS. Pt arrives with incision site looking well, bruising noted to lower abd between incisions. Fever and SOB began yesterday per pt. Surgery was last Tuesday, was DC from hospital yesterday.    Allergies No Known Allergies  Level of Care/Admitting Diagnosis ED Disposition    ED Disposition Condition Comment   Admit  Hospital Area: [facility] [[id number]]  Level of Care: Med-Surg [16]  Covid Evaluation: N/A  Diagnosis: Abscess of appendix [[id number]]  Admitting Physician: Jules Husbands [[id number]]  Attending Physician: Jules Husbands [[id number]]  Estimated length of stay: 5 - 7 days  Certification:: I certify this patient will need inpatient services for at least 2 midnights  PT Class (Do Not Modify): Inpatient [101]  PT Acc Code (Do Not Modify): Private [1]       B Medical/Surgery History Past Medical History:  Diagnosis Date  . Asthma   . CHF (congestive heart failure) ([place])   . Coronary artery disease   . Diabetes mellitus without complication ([place])   . Foot ulcer ([place])    left foot  . GERD (gastroesophageal reflux disease)   . Heart murmur   . Hypertension   . Kidney insufficiency   . Peripheral vascular disease [facility])    Past Surgical History:  Procedure Laterality Date  . AMPUTATION TOE Left 03/13/2018   Procedure: AMPUTATION TOE-MPJ;  Surgeon: Samara Deist, DPM;  Location: [facility] ORS;  Service: Podiatry;   Laterality: Left;  . ANGIOPLASTY    . CARDIAC CATHETERIZATION    . CARDIAC SURGERY     heart bypass (4)  . CORONARY ARTERY BYPASS GRAFT    . LAPAROSCOPIC APPENDECTOMY N/A 09/08/2018   Procedure: APPENDECTOMY LAPAROSCOPIC;  Surgeon: Jules Husbands, MD;  Location: [facility] ORS;  Service: General;  Laterality: N/A;  . LOWER EXTREMITY ANGIOGRAPHY Left 03/02/2018   Procedure: LOWER EXTREMITY ANGIOGRAPHY;  Surgeon: Algernon Huxley, MD;  Location: [facility];  Service: Cardiovascular;  Laterality: Left;  . ROTATOR CUFF REPAIR Left      A IV Location/Drains/Wounds Patient Lines/Drains/Airways Status   Active Line/Drains/Airways    Name:   Placement date:   Placement time:   Site:   Days:   Peripheral IV 09/22/18 Left Wrist   09/22/18    1323    Wrist   less than 1   Incision (Closed) 09/08/18 Abdomen   09/08/18    1652     14   Incision - 4 Ports Abdomen Umbilicus Mid;Lower Left;Lateral   09/08/18    1620     14          Intake/Output Last 24 hours No intake or output data in the 24 hours ending 09/22/18 1606  Labs/Imaging Results for orders placed or performed during the hospital encounter of 09/22/18 (from the past 48 hour(s))  CBC with Differential/Platelet     Status: Abnormal   Collection Time: 09/22/18  1:13 PM  Result Value Ref  Range   WBC 16.3 (H) 4.0 - 10.5 K/uL   RBC 2.71 (L) 4.22 - 5.81 MIL/uL   Hemoglobin 7.5 (L) 13.0 - 17.0 g/dL   HCT 24.5 (L) 39.0 - 52.0 %   MCV 90.4 80.0 - 100.0 fL   MCH 27.7 26.0 - 34.0 pg   MCHC 30.6 30.0 - 36.0 g/dL   RDW 16.9 (H) 11.5 - 15.5 %   Platelets 292 150 - 400 K/uL   nRBC 0.0 0.0 - 0.2 %   Neutrophils Relative % 85 %   Neutro Abs 13.9 (H) 1.7 - 7.7 K/uL   Lymphocytes Relative 7 %   Lymphs Abs 1.2 0.7 - 4.0 K/uL   Monocytes Relative 6 %   Monocytes Absolute 1.0 0.1 - 1.0 K/uL   Eosinophils Relative 1 %   Eosinophils Absolute 0.1 0.0 - 0.5 K/uL   Basophils Relative 0 %   Basophils Absolute 0.0 0.0 - 0.1 K/uL   Immature  Granulocytes 1 %   Abs Immature Granulocytes 0.15 (H) 0.00 - 0.07 K/uL    Comment: Performed at [facility] [address] [place], [place] [address]  Comprehensive metabolic panel     Status: Abnormal   Collection Time: 09/22/18  1:13 PM  Result Value Ref Range   Sodium 139 135 - 145 mmol/L   Potassium 4.7 3.5 - 5.1 mmol/L   Chloride 104 98 - 111 mmol/L   CO2 25 22 - 32 mmol/L   Glucose, Bld 130 (H) 70 - 99 mg/dL   BUN 64 (H) 8 - 23 mg/dL   Creatinine, Ser 4.68 (H) 0.61 - 1.24 mg/dL   Calcium 7.9 (L) 8.9 - 10.3 mg/dL   Total Protein 6.8 6.5 - 8.1 g/dL   Albumin 2.2 (L) 3.5 - 5.0 g/dL   AST 31 15 - 41 U/L   ALT 11 0 - 44 U/L   Alkaline Phosphatase 50 38 - 126 U/L   Total Bilirubin 1.0 0.3 - 1.2 mg/dL   GFR calc non Af Amer 12 (L) >60 mL/min   GFR calc Af Amer 14 (L) >60 mL/min   Anion gap 10 5 - 15    Comment: Performed at [facility], [address], [place], [place] [address]  Lactic acid, plasma     Status: None   Collection Time: 09/22/18  1:14 PM  Result Value Ref Range   Lactic Acid, Venous 1.2 0.5 - 1.9 mmol/L    Comment: Performed at [facility], [address] [place], [place] [address]  Blood gas, venous     Status: Abnormal (Preliminary result)   Collection Time: 09/22/18  1:30 PM  Result Value Ref Range   pH, Ven 7.43 7.250 - 7.430   pCO2, Ven 44 44.0 - 60.0 mmHg   pO2, Ven PENDING 32.0 - 45.0 mmHg   Bicarbonate 29.2 (H) 20.0 - 28.0 mmol/L   Acid-Base Excess 4.2 (H) 0.0 - 2.0 mmol/L   O2 Saturation 55.1 %   Patient temperature 37.0    Collection site VENOUS    Sample type VENOUS     Comment: Performed at [facility], [address], [place],  [address]   Ct Abdomen Pelvis Wo Contrast  Result Date: 09/22/2018 CLINICAL DATA:  Fever status post appendectomy last week. EXAM: CT ABDOMEN AND PELVIS WITHOUT CONTRAST TECHNIQUE: Multidetector CT imaging of the abdomen and pelvis was performed following the standard  protocol without IV contrast. COMPARISON:  CT scan of September 15, 2018.  FINDINGS: Lower chest: No acute abnormality. Hepatobiliary: Cholelithiasis is noted. No biliary dilatation is noted. Stable small amount of free fluid is noted around the liver. No focal hepatic abnormality is noted on these unenhanced images. Pancreas: Unremarkable. No pancreatic ductal dilatation or surrounding inflammatory changes. Spleen: There is interval development of mild amount of fluid around the spleen which demonstrates Hounsfield measurement of 22 concerning for possible hemorrhage. Adrenals/Urinary Tract: Adrenal glands are unremarkable. Kidneys are normal, without renal calculi, focal lesion, or hydronephrosis. Bladder is unremarkable. Stomach/Bowel: The stomach appears normal. Status post appendectomy. There is no evidence of bowel obstruction. There is noted 10.7 x 6.9 cm abnormality in the right lower quadrant concerning for abscess or hematoma. No definite bowel dilatation is noted. Vascular/Lymphatic: Aortic atherosclerosis. No enlarged abdominal or pelvic lymph nodes. Reproductive: Prostate is unremarkable. Other: No abnormal hernia is noted. There is also noted stable hyperdense fluid in the pelvis. Musculoskeletal: Stable grade 1 anterolisthesis of L5-S1 is noted secondary to bilateral L5 pars defects. IMPRESSION: Interval development of 10.7 x 6.9 cm probable abscess or less likely hematoma seen in the right lower quadrant of the abdomen. Stable amount of probable intraperitoneal hemorrhage is noted in the pelvis and around the liver, with new fluid noted around the spleen. Critical Value/emergent results were called by telephone at the time of interpretation on 09/22/2018 at 2:20 pm to Dr. Merlyn Lot , who verbally acknowledged these results. Cholelithiasis is noted. Aortic Atherosclerosis (ICD10-I70.0). Electronically Signed   By: Marijo Conception M.D.   On: 09/22/2018 14:21   Dg Chest Portable 1 View  Result  Date: 09/22/2018 CLINICAL DATA:  Laparoscopic appendectomy on 09/08/2018. Patient presents with fever, shortness of breath and acute mental status changes that began yesterday. Former smoker. EXAM: PORTABLE CHEST 1 VIEW COMPARISON:  09/15/2018 and earlier. FINDINGS: Sternotomy for CABG. Cardiac silhouette moderately enlarged. Chronic elevation of the RIGHT hemidiaphragm and chronic scar/atelectasis at the RIGHT lung base. Pulmonary venous hypertension and minimal to mild interstitial pulmonary edema, new since the prior exams. No confluent airspace consolidation. No visible pleural effusions. IMPRESSION: Minimal to mild CHF, with moderate cardiomegaly and minimal to mild diffuse interstitial pulmonary edema. Electronically Signed   By: Evangeline Dakin M.D.   On: 09/22/2018 13:32    Pending Labs Unresulted Labs (From admission, onward)    Start     Ordered   09/22/18 1602  Prepare RBC  (Adult Blood Administration - Red Blood Cells)  Once,   R    Question Answer Comment  # of Units 1 unit   Transfusion Indications Symptomatic Anemia   If emergent release call blood bank Not emergent release      09/22/18 1601   09/22/18 1329  Lactic acid, plasma  STAT Now then every 3 hours,   STAT     09/22/18 1329   09/22/18 1329  Blood Culture (routine x 2)  BLOOD CULTURE X 2,   STAT     09/22/18 1329   09/22/18 1329  Urine culture  ONCE - STAT,   STAT     09/22/18 1329   09/22/18 1309  Urinalysis, Complete w Microscopic  Once,   STAT     09/22/18 1308   Signed and Held  Basic metabolic panel  Tomorrow morning,   R     Signed and Held   Signed and Held  Magnesium  Tomorrow morning,   R     Signed and Held   Signed and Held  CBC  Tomorrow  morning,   R     Signed and Held   Signed and Held  Phosphorus  Tomorrow morning,   R     Signed and Held   Signed and Held  APTT  Tomorrow morning,   R     Signed and Held   Signed and Held  Protime-INR  Tomorrow morning,   R     Signed and Held           Vitals/Pain Today's Vitals   09/22/18 1426 09/22/18 1510 09/22/18 1530 09/22/18 1548  BP: 105/63 (!) 122/58 (!) 100/52 106/63  Pulse: 88 88  85  Resp: (!) 22 (!) 25  (!) 21  Temp: (!) 101 F (38.3 C)     TempSrc: Rectal     SpO2: 95% 96%  96%  Weight:      Height:      PainSc:    Asleep    Isolation Precautions No active isolations  Medications Medications  metroNIDAZOLE (FLAGYL) IVPB 500 mg (500 mg Intravenous New Bag/Given 09/22/18 1508)  cefTRIAXone (ROCEPHIN) 1 g in sodium chloride 0.9 % 100 mL IVPB (0 g Intravenous Stopped 09/22/18 1508)  acetaminophen (TYLENOL) suppository 650 mg (650 mg Rectal Given 09/22/18 1508)    Mobility non-ambulatory Low fall risk   Focused Assessments Neuro Assessment Handoff:  Swallow screen pass? Yes  Cardiac Rhythm: Normal sinus rhythm       Neuro Assessment:   Neuro Checks:      Last Documented NIHSS Modified Score:   Has TPA been given? No If patient is a Neuro Trauma and patient is going to OR before floor call report to [place] nurse: [phone] or [phone]     R Recommendations: See Admitting Provider Note  Report given to:   Additional Notes:

## 2018-09-22 NOTE — ED Notes (Signed)
Pt taken to CT via stretcher.

## 2018-09-22 NOTE — H&P (Signed)
Patient ID: Phillip [name], male   DOB: 03-10-1956, 63 y.o.   MRN: [id number]  HPI Phillip [name] is a 63 y.o. male well know to me s/p lap appy for gangrenous appendicitis 4/7, he was on plavix and ASA at that time and had chronic renal ins. HE had a prolonged hospitalization complicated by AKI w creat reaching almost 8 from presumed constrast induced nephropathy and acute illness. He was sent home yesterday. Apparently he was ambulating and taking PO. HE came in today with altered mental status and fever up to 101. I talked to him wife over the phone for 25 minutes. She states that he also had some loose BM and had altered mentation. Ct scan pers. Reviewed and d/w radiologist there is a infected hematoma vs abscess RLQ, there is hematoma in the pelvis and fluid around the spleen ? Ascites vs blood, might be shifted from pelvis, no evidence of spleen or other solid organ injuries although scan is limited due to the lack of contrast. He did take plavix and ASA this am. WBC 15k creat 4.2 k 4.6 alb 2.2 normal lactate hb 7.5. HE does have DM, PAD, HTN, CAD and CHF   HPI  Past Medical History:  Diagnosis Date  . Asthma   . CHF (congestive heart failure) ([place])   . Coronary artery disease   . Diabetes mellitus without complication ([place])   . Foot ulcer ([place])    left foot  . GERD (gastroesophageal reflux disease)   . Heart murmur   . Hypertension   . Kidney insufficiency   . Peripheral vascular disease [facility])     Past Surgical History:  Procedure Laterality Date  . AMPUTATION TOE Left 03/13/2018   Procedure: AMPUTATION TOE-MPJ;  Surgeon: Samara Deist, DPM;  Location: [facility] ORS;  Service: Podiatry;  Laterality: Left;  . ANGIOPLASTY    . CARDIAC CATHETERIZATION    . CARDIAC SURGERY     heart bypass (4)  . CORONARY ARTERY BYPASS GRAFT    . LAPAROSCOPIC APPENDECTOMY N/A 09/08/2018   Procedure: APPENDECTOMY LAPAROSCOPIC;  Surgeon: Jules Husbands, MD;  Location: [facility] ORS;  Service: General;   Laterality: N/A;  . LOWER EXTREMITY ANGIOGRAPHY Left 03/02/2018   Procedure: LOWER EXTREMITY ANGIOGRAPHY;  Surgeon: Algernon Huxley, MD;  Location: [facility];  Service: Cardiovascular;  Laterality: Left;  . ROTATOR CUFF REPAIR Left     Family History  Problem Relation Age of Onset  . Hyperlipidemia Mother   . Hypertension Mother   . Diabetes Mother   . Heart attack Brother   . Heart disease Brother     Social History Social History   Tobacco Use  . Smoking status: Former Smoker    Last attempt to quit: 2003    Years since quitting: 17.3  . Smokeless tobacco: Never Used  Substance Use Topics  . Alcohol use: Not Currently    Comment: 2003  . Drug use: Not Currently    Types: Marijuana    No Known Allergies  Current Facility-Administered Medications  Medication Dose Route Frequency Provider Last Rate Last Dose  . metroNIDAZOLE (FLAGYL) IVPB 500 mg  500 mg Intravenous Once Merlyn Lot, MD 100 mL/hr at 09/22/18 1508 500 mg at 09/22/18 1508   Current Outpatient Medications  Medication Sig Dispense Refill  . acetaminophen (TYLENOL) 500 MG tablet Take 1,000 mg by mouth every 6 (six) hours as needed for moderate pain or headache.     Marland Kitchen amoxicillin-clavulanate (AUGMENTIN) 875-125 MG tablet  Take 1 tablet by mouth 2 (two) times daily for 2 days. 4 tablet 0  . aspirin 81 MG tablet Take 81 mg by mouth daily.    . carvedilol (COREG) 6.25 MG tablet Take 6.25 mg by mouth 2 (two) times daily with a meal.     . cloNIDine (CATAPRES) 0.3 MG tablet Take 0.3 mg by mouth 2 (two) times daily.     . clopidogrel (PLAVIX) 75 MG tablet Take 1 tablet (75 mg total) by mouth daily. 30 tablet 11  . gabapentin (NEURONTIN) 600 MG tablet Take 600 mg by mouth 2 (two) times daily.     Marland Kitchen glipiZIDE (GLUCOTROL XL) 10 MG 24 hr tablet Take 10 mg by mouth daily with breakfast.    . insulin detemir (LEVEMIR) 100 UNIT/ML injection Inject 40-80 Units into the skin at bedtime. Inject 40 units  subcutaneously every morning and 80 units every evening    . isosorbide mononitrate (IMDUR) 120 MG 24 hr tablet Take 120 mg by mouth daily.    Marland Kitchen lovastatin (MEVACOR) 40 MG tablet Take 40 mg by mouth at bedtime.    . nitroGLYCERIN (NITROSTAT) 0.4 MG SL tablet Place 0.4 mg under the tongue every 5 (five) minutes as needed for chest pain.     Marland Kitchen oxyCODONE (OXY IR/ROXICODONE) 5 MG immediate release tablet Take 1-2 tablets (5-10 mg total) by mouth every 4 (four) hours as needed for moderate pain. 10 tablet 0     Review of Systems Full ROS  was asked and was negative except for the information on the HPI  Physical Exam Blood pressure 106/63, pulse 85, temperature (!) 101 F (38.3 C), temperature source Rectal, resp. rate (!) 21, height 6\' 3"  (1.905 m), weight 108.9 kg, SpO2 96 %. CONSTITUTIONAL: awake but disoriented w anasarca EYES: Pupils are equal, round, and reactive to light, Sclera are non-icteric. EARS, NOSE, MOUTH AND THROAT: The oropharynx is clear. The oral mucosa is pink and moist. Hearing is intact to voice. LYMPH NODES:  Lymph nodes in the neck are normal. RESPIRATORY:  Lungs are clear. There is normal respiratory effort, with equal breath sounds bilaterally, and without pathologic use of accessory muscles. CARDIOVASCULAR: Heart is regular without murmurs, gallops, or rubs. GI: The abdomen is soft, mild TTP RLQ, no peritonitis and nondistended. There is some ecchymosis within the abd wall. There are no palpable masses.  There are normal bowel sounds in all quadrants. GU: Rectal deferred.   MUSCULOSKELETAL:pitting edema all the way to his thighs SKIN: Turgor is good and there are no pathologic skin lesions or ulcers. NEUROLOGIC: Motor and sensation is grossly normal. Cranial nerves are grossly intact. PSYCH:  Oriented to person, place and time. Affect is normal.  Data Reviewed  I have personally reviewed the patient's imaging, laboratory findings and medical records.     Assessment/Plan 63 yo male with multiple co morbidities as outline above with intra-abdominal abscess after appendectomy from gangrenous appendicitis. Complicated by failure to thrive, renal disease, CHF, CAD, anemia, PVD. We will admit, telemetry, give him 1 unit of RBC, IV a/bs and perc drain the collection. I do not see an indication for emergent surgical intervention.  I have d/w the wife extensively about his disease process. We will hold any antiplatelets at this time, we will get renal and hospitalist involve again.   We will perform serial abdominal exams. D/W the wife in detail. I will also order an echo to re asess his cardiac fx.  Caroleen Hamman, MD FACS General Surgeon 09/22/2018, 3:58 PM

## 2018-09-22 NOTE — ED Provider Notes (Signed)
[facility] Emergency Department Provider Note    First MD Initiated Contact with Patient 09/22/18 1307     (approximate)  I have reviewed the triage vital signs and the nursing notes.   HISTORY  Chief Complaint Fever and Post-op Problem  Level v Caveat:  AMS HPI Phillip [name] is a 63 y.o. male with extensive past medical history as listed below with recent admission to hospital for appendicitis discharged yesterday presents the ER with persistent fever and altered mental status.  According to family patient has been "talking out of his head ".    Past Medical History:  Diagnosis Date  . Asthma   . CHF (congestive heart failure) ([place])   . Coronary artery disease   . Diabetes mellitus without complication ([place])   . Foot ulcer ([place])    left foot  . GERD (gastroesophageal reflux disease)   . Heart murmur   . Hypertension   . Kidney insufficiency   . Peripheral vascular disease ([place])    Family History  Problem Relation Age of Onset  . Hyperlipidemia Mother   . Hypertension Mother   . Diabetes Mother   . Heart attack Brother   . Heart disease Brother    Past Surgical History:  Procedure Laterality Date  . AMPUTATION TOE Left 03/13/2018   Procedure: AMPUTATION TOE-MPJ;  Surgeon: Samara Deist, DPM;  Location: [facility] ORS;  Service: Podiatry;  Laterality: Left;  . ANGIOPLASTY    . CARDIAC CATHETERIZATION    . CARDIAC SURGERY     heart bypass (4)  . CORONARY ARTERY BYPASS GRAFT    . LAPAROSCOPIC APPENDECTOMY N/A 09/08/2018   Procedure: APPENDECTOMY LAPAROSCOPIC;  Surgeon: Jules Husbands, MD;  Location: [facility] ORS;  Service: General;  Laterality: N/A;  . LOWER EXTREMITY ANGIOGRAPHY Left 03/02/2018   Procedure: LOWER EXTREMITY ANGIOGRAPHY;  Surgeon: Algernon Huxley, MD;  Location: [place] CV LAB;  Service: Cardiovascular;  Laterality: Left;  . ROTATOR CUFF REPAIR Left    Patient Active Problem List   Diagnosis Date Noted  . Appendicitis  09/08/2018  . Foot ulcer ([place]) 04/24/2018  . Diabetes mellitus without complication ([place]) 56/25/6389  . Hypertension 02/24/2018  . CHF (congestive heart failure) ([place]) 02/24/2018  . Atherosclerosis of native arteries of the extremities with ulceration ([place]) 02/24/2018      Prior to Admission medications   Medication Sig Start Date End Date Taking? Authorizing Provider  acetaminophen (TYLENOL) 500 MG tablet Take 1,000 mg by mouth every 6 (six) hours as needed for moderate pain or headache.    Yes [provider]  amoxicillin-clavulanate (AUGMENTIN) 875-125 MG tablet Take 1 tablet by mouth 2 (two) times daily for 2 days. 09/21/18 09/23/18 Yes Tylene Fantasia, PA-C  aspirin 81 MG tablet Take 81 mg by mouth daily.   Yes [provider]  carvedilol (COREG) 6.25 MG tablet Take 6.25 mg by mouth 2 (two) times daily with a meal.  11/14/15  Yes [provider]  cloNIDine (CATAPRES) 0.3 MG tablet Take 0.3 mg by mouth 2 (two) times daily.    Yes [provider]  clopidogrel (PLAVIX) 75 MG tablet Take 1 tablet (75 mg total) by mouth daily. 03/02/18  Yes Dew, Erskine Squibb, MD  gabapentin (NEURONTIN) 600 MG tablet Take 600 mg by mouth 2 (two) times daily.    Yes [provider]  glipiZIDE (GLUCOTROL XL) 10 MG 24 hr tablet Take 10 mg by mouth daily with breakfast.   Yes [provider]  insulin detemir (LEVEMIR) 100 UNIT/ML injection Inject 40-80 Units into the skin at bedtime. Inject 40 units subcutaneously every morning and 80 units every evening   Yes [provider]  isosorbide mononitrate (IMDUR) 120 MG 24 hr tablet Take 120 mg by mouth daily.   Yes [provider]  lovastatin (MEVACOR) 40 MG tablet Take 40 mg by mouth at bedtime.   Yes [provider]  nitroGLYCERIN (NITROSTAT) 0.4 MG SL tablet Place 0.4 mg under the tongue every 5 (five) minutes as needed for chest pain.    Yes [provider]  oxyCODONE (OXY  IR/ROXICODONE) 5 MG immediate release tablet Take 1-2 tablets (5-10 mg total) by mouth every 4 (four) hours as needed for moderate pain. 09/21/18  Yes Tylene Fantasia, PA-C    Allergies Patient has no known allergies.    Social History Social History   Tobacco Use  . Smoking status: Former Smoker    Last attempt to quit: 2003    Years since quitting: 17.3  . Smokeless tobacco: Never Used  Substance Use Topics  . Alcohol use: Not Currently    Comment: 2003  . Drug use: Not Currently    Types: Marijuana    Review of Systems Patient denies headaches, rhinorrhea, blurry vision, numbness, shortness of breath, chest pain, edema, cough, abdominal pain, nausea, vomiting, diarrhea, dysuria, fevers, rashes or hallucinations unless otherwise stated above in HPI. ____________________________________________   PHYSICAL EXAM:  VITAL SIGNS: Vitals:   09/22/18 1426 09/22/18 1510  BP: 105/63 (!) 122/58  Pulse: 88 88  Resp: (!) 22 (!) 25  Temp: (!) 101 F (38.3 C)   SpO2: 95% 96%    Constitutional: Alert, confused and disoriented Eyes: Conjunctivae are normal.  Head: Atraumatic. Nose: No congestion/rhinnorhea. Mouth/Throat: Mucous membranes are moist.   Neck: No stridor. Painless ROM.  Cardiovascular: Normal rate, regular rhythm. Grossly normal heart sounds.  Good peripheral circulation. Respiratory: tachypnea .  No retractions. Lungs with diminished posterior breathsounds Gastrointestinal: ttp in lower quadrants. No distention. No abdominal bruits. Anterior abdominal wall ecchymosis Genitourinary:  Musculoskeletal: No lower extremity tenderness 2+ BLE edema.  No joint effusions. Neurologic:  No gross focal neurologic deficits are appreciated. No facial droop Skin:  Skin is warm, dry and intact. No rash noted. Psychiatric: Mood and affect are normal.  ____________________________________________   LABS (all labs ordered are listed, but only abnormal results are displayed)   Results for orders placed or performed during the hospital encounter of 09/22/18 (from the past 24 hour(s))  CBC with Differential/Platelet     Status: Abnormal   Collection Time: 09/22/18  1:13 PM  Result Value Ref Range   WBC 16.3 (H) 4.0 - 10.5 K/uL   RBC 2.71 (L) 4.22 - 5.81 MIL/uL   Hemoglobin 7.5 (L) 13.0 - 17.0 g/dL   HCT 24.5 (L) 39.0 - 52.0 %   MCV 90.4 80.0 - 100.0 fL   MCH 27.7 26.0 - 34.0 pg   MCHC 30.6 30.0 - 36.0 g/dL   RDW 16.9 (H) 11.5 - 15.5 %   Platelets 292 150 - 400 K/uL   nRBC 0.0 0.0 - 0.2 %   Neutrophils Relative % 85 %   Neutro Abs 13.9 (H) 1.7 - 7.7 K/uL   Lymphocytes Relative 7 %   Lymphs Abs 1.2 0.7 - 4.0 K/uL   Monocytes Relative 6 %   Monocytes Absolute 1.0 0.1 - 1.0 K/uL   Eosinophils Relative 1 %   Eosinophils Absolute  0.1 0.0 - 0.5 K/uL   Basophils Relative 0 %   Basophils Absolute 0.0 0.0 - 0.1 K/uL   Immature Granulocytes 1 %   Abs Immature Granulocytes 0.15 (H) 0.00 - 0.07 K/uL  Comprehensive metabolic panel     Status: Abnormal   Collection Time: 09/22/18  1:13 PM  Result Value Ref Range   Sodium 139 135 - 145 mmol/L   Potassium 4.7 3.5 - 5.1 mmol/L   Chloride 104 98 - 111 mmol/L   CO2 25 22 - 32 mmol/L   Glucose, Bld 130 (H) 70 - 99 mg/dL   BUN 64 (H) 8 - 23 mg/dL   Creatinine, Ser 4.68 (H) 0.61 - 1.24 mg/dL   Calcium 7.9 (L) 8.9 - 10.3 mg/dL   Total Protein 6.8 6.5 - 8.1 g/dL   Albumin 2.2 (L) 3.5 - 5.0 g/dL   AST 31 15 - 41 U/L   ALT 11 0 - 44 U/L   Alkaline Phosphatase 50 38 - 126 U/L   Total Bilirubin 1.0 0.3 - 1.2 mg/dL   GFR calc non Af Amer 12 (L) >60 mL/min   GFR calc Af Amer 14 (L) >60 mL/min   Anion gap 10 5 - 15  Lactic acid, plasma     Status: None   Collection Time: 09/22/18  1:14 PM  Result Value Ref Range   Lactic Acid, Venous 1.2 0.5 - 1.9 mmol/L  Blood gas, venous     Status: Abnormal (Preliminary result)   Collection Time: 09/22/18  1:30 PM  Result Value Ref Range   pH, Ven 7.43 7.250 - 7.430   pCO2, Ven 44  44.0 - 60.0 mmHg   pO2, Ven PENDING 32.0 - 45.0 mmHg   Bicarbonate 29.2 (H) 20.0 - 28.0 mmol/L   Acid-Base Excess 4.2 (H) 0.0 - 2.0 mmol/L   O2 Saturation 55.1 %   Patient temperature 37.0    Collection site VENOUS    Sample type VENOUS    ____________________________________________ ____________________________________________  RADIOLOGY  I personally reviewed all radiographic images ordered to evaluate for the above acute complaints and reviewed radiology reports and findings.  These findings were personally discussed with the patient.  Please see medical record for radiology report.  ____________________________________________   PROCEDURES  Procedure(s) performed:  .Critical Care Performed by: Merlyn Lot, MD Authorized by: Merlyn Lot, MD   Critical care provider statement:    Critical care time (minutes):  30   Critical care time was exclusive of:  Separately billable procedures and treating other patients   Critical care was necessary to treat or prevent imminent or life-threatening deterioration of the following conditions:  Sepsis   Critical care was time spent personally by me on the following activities:  Development of treatment plan with patient or surrogate, discussions with consultants, evaluation of patient's response to treatment, examination of patient, obtaining history from patient or surrogate, ordering and performing treatments and interventions, ordering and review of laboratory studies, ordering and review of radiographic studies, pulse oximetry, re-evaluation of patient's condition and review of old charts      Critical Care performed: yes ____________________________________________   INITIAL IMPRESSION / [place] / ED COURSE  Pertinent labs & imaging results that were available during my care of the patient were reviewed by me and considered in my medical decision making (see chart for details).   DDX: Dehydration, sepsis,  abscess pna, uti, hypoglycemia, cva, drug effect, withdrawal, encephalitis   KOLLIN UDELL is a 63 y.o.  who presents to the ED with his as described above.  He is currently protecting his airway.  No hypoxia or respiratory distress though he is a little tachypneic.  Blood work as well as chest x-ray and CT imaging will be ordered for the by differential.  Clinical Course as of Sep 21 1516  Tue Sep 22, 2018  1451 Was notified by radiology regarding concern for intra-abdominal abscess.  I discussed case with Dr. Adora Fridge of general surgery who kindly agrees admit patient for further management.  Patient receiving IV antibiotics.  Respiratory rate improving.  At this point patient is stable for admission the hospital for further management.  Have discussed with the patient and available family all diagnostics and treatments performed thus far and all questions were answered to the best of my ability. The patient demonstrates understanding and agreement with plan.    [PR]    Clinical Course User Index [PR] Merlyn Lot, MD   The patient was evaluated in Emergency Department today for the symptoms described in the history of present illness. He/she was evaluated in the context of the global COVID-19 pandemic, which necessitated consideration that the patient might be at risk for infection with the SARS-CoV-2 virus that causes COVID-19. Institutional protocols and algorithms that pertain to the evaluation of patients at risk for COVID-19 are in a state of rapid change based on information released by regulatory bodies including the CDC and federal and state organizations. These policies and algorithms were followed during the patient's care in the ED.   As part of my medical decision making, I reviewed the following data within the [place] notes reviewed and incorporated, Labs reviewed, notes from prior ED visits and [place] Controlled Substance Database    ____________________________________________   FINAL CLINICAL IMPRESSION(S) / ED DIAGNOSES  Final diagnoses:  Sepsis, due to unspecified organism, unspecified whether acute organ dysfunction present ([place])  Intra-abdominal abscess ([place])      NEW MEDICATIONS STARTED DURING THIS VISIT:  New Prescriptions   No medications on file     Note:  This document was prepared using Dragon voice recognition software and may include unintentional dictation errors.    Merlyn Lot, MD 09/22/18 [phone]

## 2018-09-22 NOTE — ED Notes (Signed)
Patient transported to 214

## 2018-09-22 NOTE — ED Notes (Signed)
Patient is resting comfortably. 

## 2018-09-22 NOTE — ED Triage Notes (Signed)
Pt arrives [facility] from home for fever after having an appendectomy. Family called 31 originally for syncopal episode. CBG 152. EMS reports that family reported pt has "talking out of his head." 96% on RA. VSS with EMS. Pt arrives with incision site looking well, bruising noted to lower abd between incisions. Fever and SOB began yesterday per pt. Surgery was last Tuesday, was DC from hospital yesterday.

## 2018-09-23 ENCOUNTER — Inpatient Hospital Stay: Payer: PPO

## 2018-09-23 LAB — GLUCOSE, CAPILLARY
Glucose-Capillary: 57 mg/dL — ABNORMAL LOW (ref 70–99)
Glucose-Capillary: 92 mg/dL (ref 70–99)
Glucose-Capillary: 45 mg/dL — ABNORMAL LOW (ref 70–99)
Glucose-Capillary: 89 mg/dL (ref 70–99)
Glucose-Capillary: 73 mg/dL (ref 70–99)
Glucose-Capillary: 82 mg/dL (ref 70–99)
Glucose-Capillary: 87 mg/dL (ref 70–99)

## 2018-09-23 LAB — BASIC METABOLIC PANEL
Anion gap: 12 (ref 5–15)
BUN: 63 mg/dL — ABNORMAL HIGH (ref 8–23)
CO2: 24 mmol/L (ref 22–32)
Calcium: 8.1 mg/dL — ABNORMAL LOW (ref 8.9–10.3)
Chloride: 106 mmol/L (ref 98–111)
Creatinine, Ser: 4.86 mg/dL — ABNORMAL HIGH (ref 0.61–1.24)
GFR calc Af Amer: 14 mL/min — ABNORMAL LOW (ref >60)
GFR calc non Af Amer: 12 mL/min — ABNORMAL LOW (ref >60)
Glucose, Bld: 62 mg/dL — ABNORMAL LOW (ref 70–99)
Potassium: 4.2 mmol/L (ref 3.5–5.1)
Sodium: 142 mmol/L (ref 135–145)

## 2018-09-23 LAB — PREPARE RBC (CROSSMATCH)

## 2018-09-23 LAB — PROTIME-INR
INR: 1.2 (ref 0.8–1.2)
Prothrombin Time: 15.2 seconds (ref 11.4–15.2)

## 2018-09-23 LAB — CBC
HCT: 25.7 % — ABNORMAL LOW (ref 39.0–52.0)
Hemoglobin: 7.6 g/dL — ABNORMAL LOW (ref 13.0–17.0)
MCH: 27.3 pg (ref 26.0–34.0)
MCHC: 29.6 g/dL — ABNORMAL LOW (ref 30.0–36.0)
MCV: 92.4 fL (ref 80.0–100.0)
Platelets: 288 10*3/uL (ref 150–400)
RBC: 2.78 MIL/uL — ABNORMAL LOW (ref 4.22–5.81)
RDW: 16.6 % — ABNORMAL HIGH (ref 11.5–15.5)
WBC: 14.2 10*3/uL — ABNORMAL HIGH (ref 4.0–10.5)
nRBC: 0 % (ref 0.0–0.2)

## 2018-09-23 LAB — PHOSPHORUS: Phosphorus: 5 mg/dL — ABNORMAL HIGH (ref 2.5–4.6)

## 2018-09-23 LAB — HEMOGLOBIN AND HEMATOCRIT, BLOOD
HCT: 28.7 % — ABNORMAL LOW (ref 39.0–52.0)
Hemoglobin: 8.8 g/dL — ABNORMAL LOW (ref 13.0–17.0)

## 2018-09-23 LAB — URINE CULTURE: Culture: NO GROWTH

## 2018-09-23 LAB — MAGNESIUM: Magnesium: 2.3 mg/dL (ref 1.7–2.4)

## 2018-09-23 LAB — APTT: aPTT: 44 seconds — ABNORMAL HIGH (ref 24–36)

## 2018-09-23 IMAGING — DX CHEST  1 VIEW
1 series · 1 of 1 positions shown · non-contrast
Comparison: [DATE]

CLINICAL DATA: Shortness of breath

EXAM:
CHEST  1 VIEW

[chest ap]
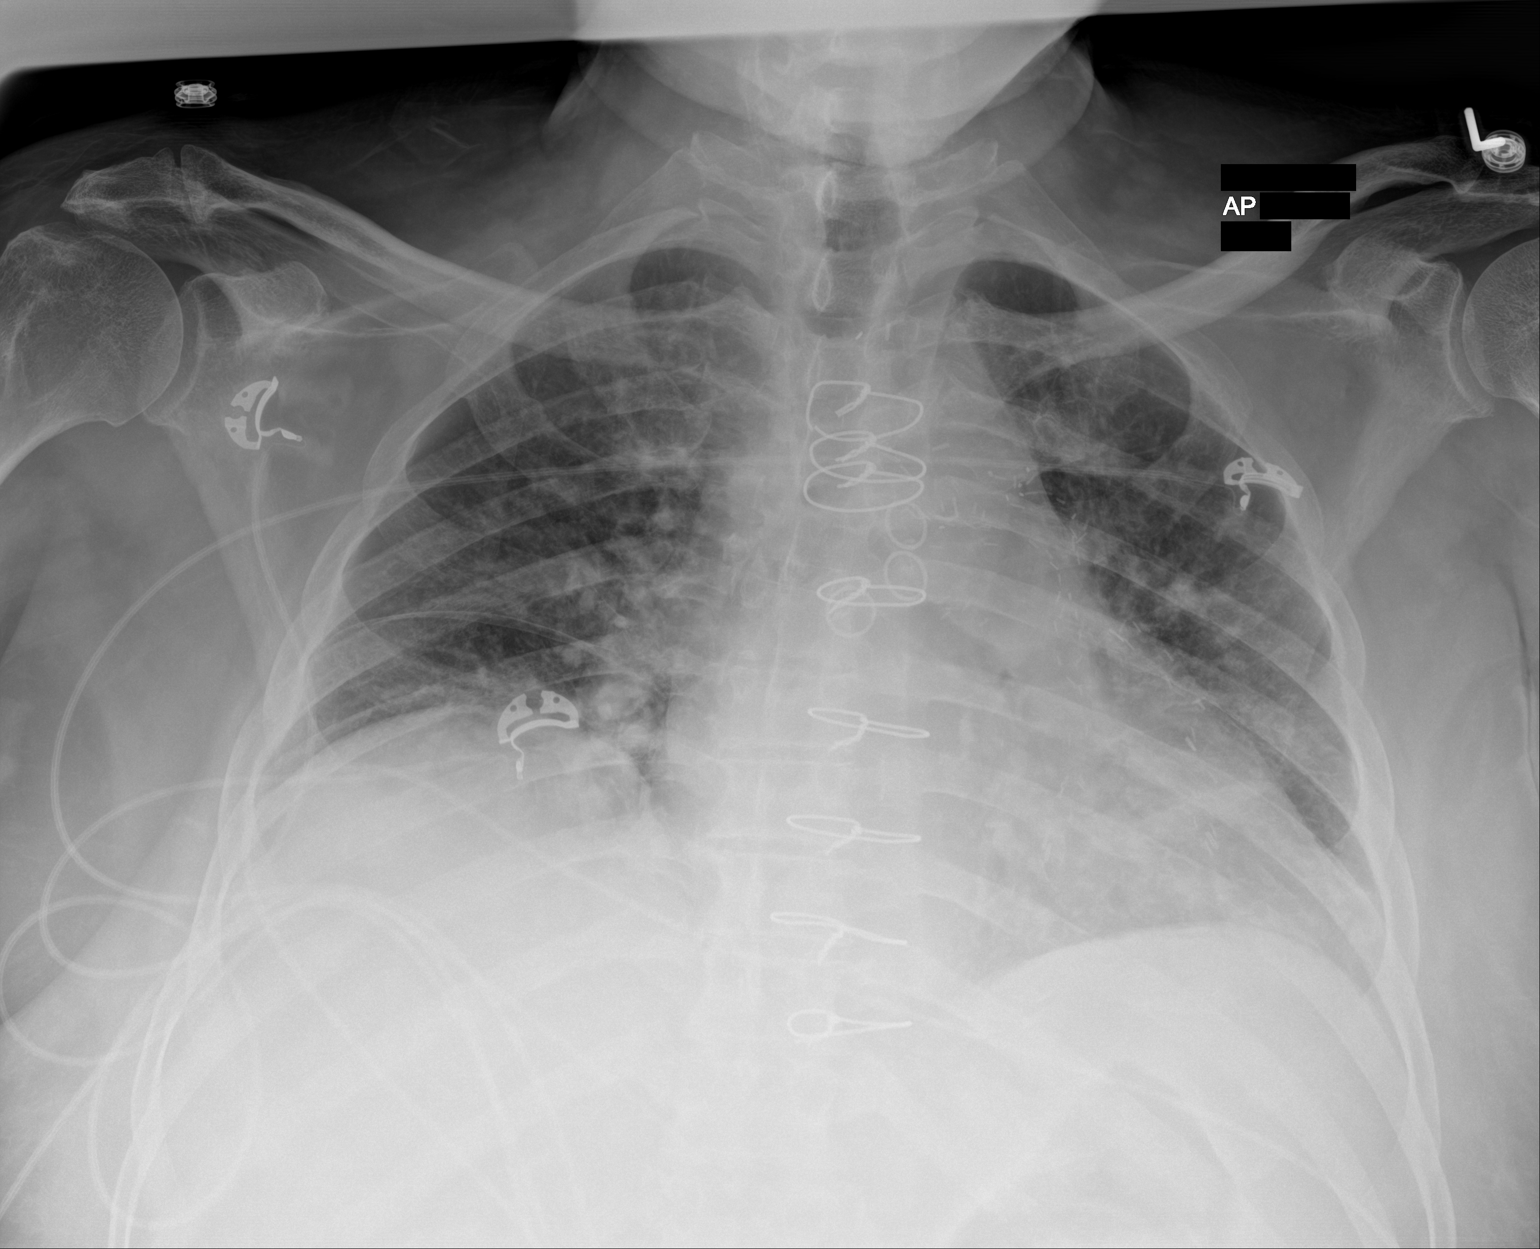

[1 of 1 positions shown; findings below may reference images not displayed]

FINDINGS: Prior CABG. Cardiomegaly with vascular congestion and mild pulmonary
edema. Low volumes with bibasilar atelectasis. No visible effusions
or acute bony abnormality.
IMPRESSION: Cardiomegaly with vascular congestion and probable mild interstitial
edema.

Low volumes with bibasilar atelectasis.

## 2018-09-23 IMAGING — CT CT IMAGE GUIDED DRAINAGE BY PERCUTANEOUS CATHETER
2 of 7 series · 15 of 46 positions shown, 17 images · non-contrast
Comparison: none

INDICATION: History of recent appendectomy with postoperative hematoma and
enlarging air-fluid collection
TECHNIQUE: Informed written consent was obtained from the patient's wife after
a thorough discussion of the procedural risks, benefits and
alternatives. All questions were addressed. Maximal Sterile Barrier
Technique was utilized including caps, mask, sterile gowns, sterile
gloves, sterile drape, hand hygiene and skin antiseptic. A timeout
was performed prior to the initiation of the procedure.

[Series 3: coronal st · coronal · 0.64mm/px · 3 of 110 slices shown]
[im 37/110  soft-tissue]
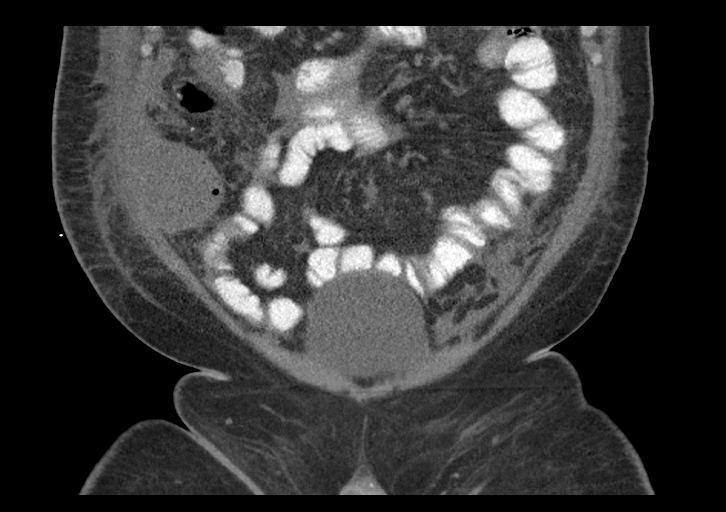
[im 49/110  soft-tissue]
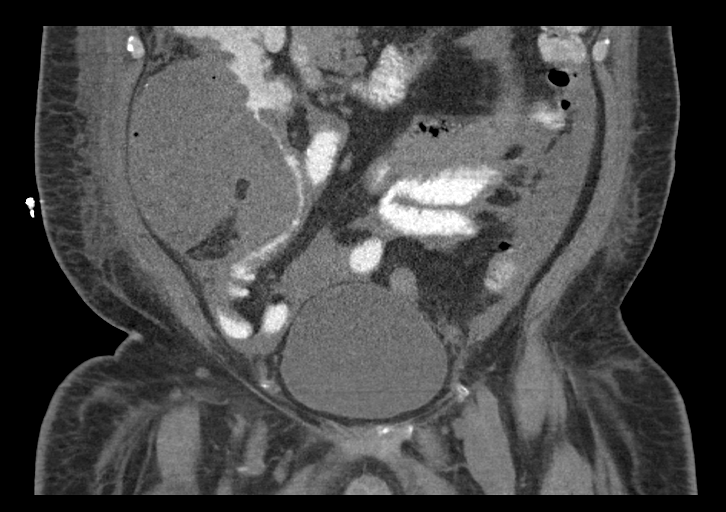
[im 61/110  soft-tissue]
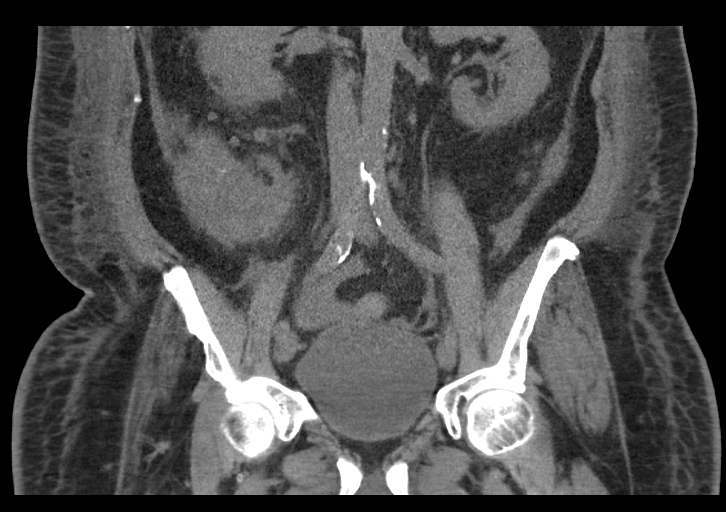

[Series 5: thins · axial · 0.78mm/px · z∈[-401,-139]mm · 12 of 597 slices shown, 14 images]
[im 48/597  soft-tissue]
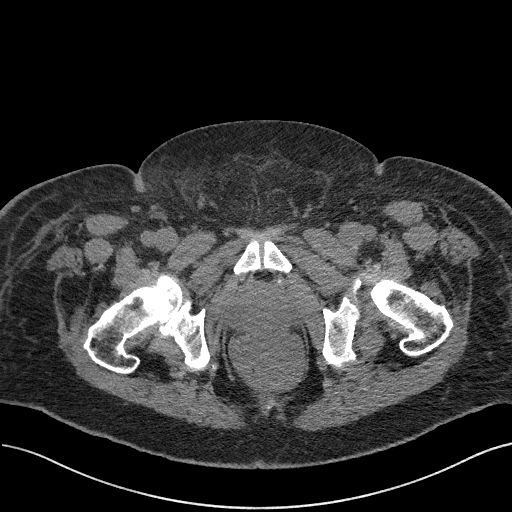
[im 48/597  bone]
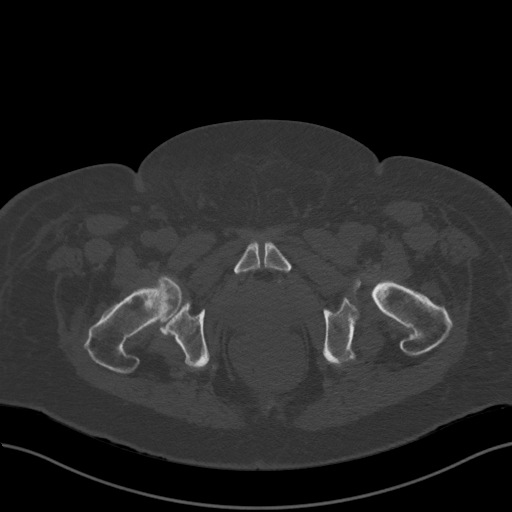
[im 96/597  soft-tissue]
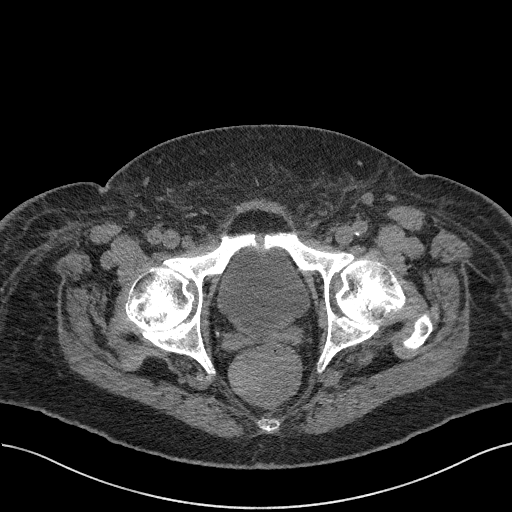
[im 144/597  soft-tissue]
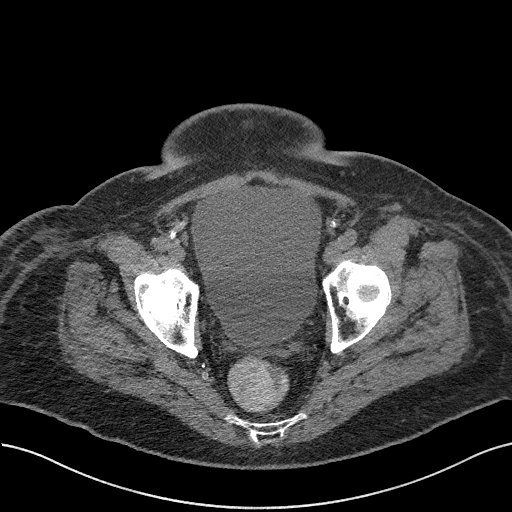
[im 191/597  soft-tissue]
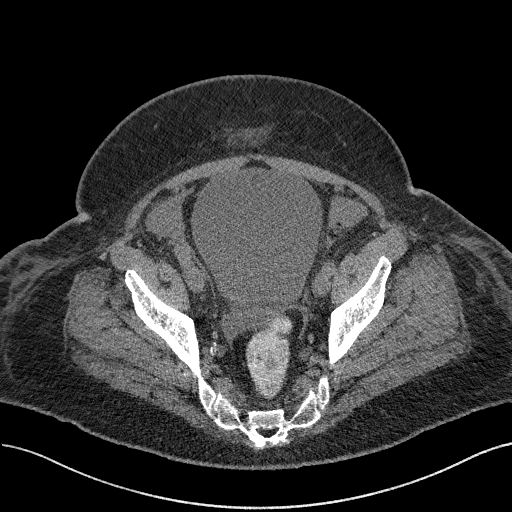
[im 239/597  soft-tissue]
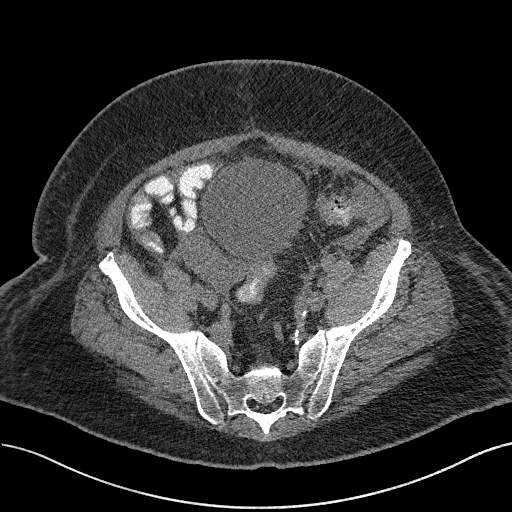
[im 287/597  soft-tissue]
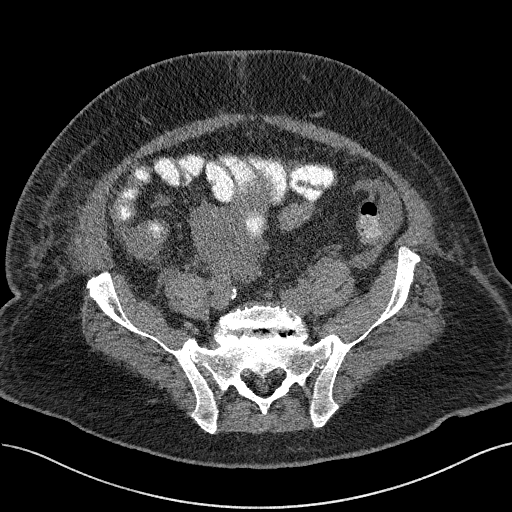
[im 334/597  soft-tissue]
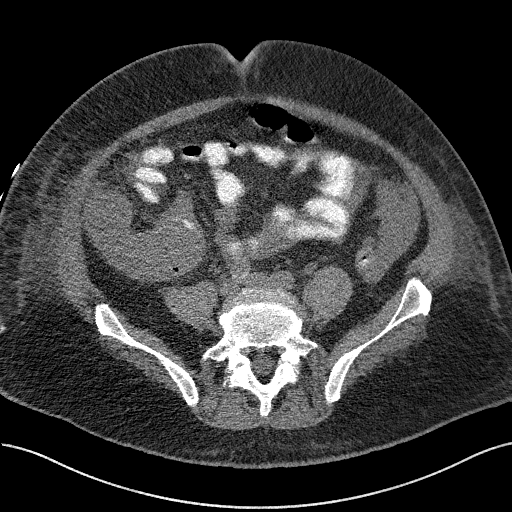
[im 382/597  soft-tissue]
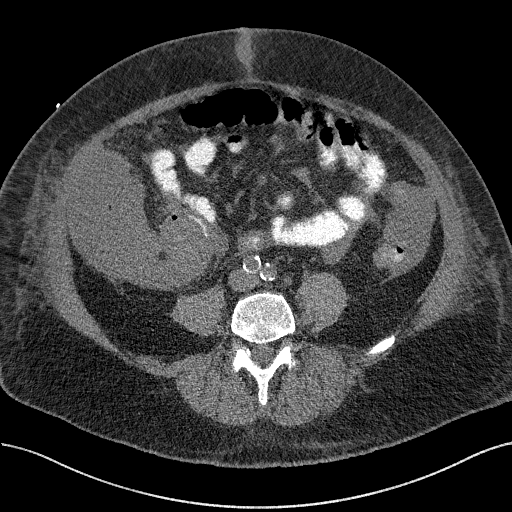
[im 430/597  soft-tissue]
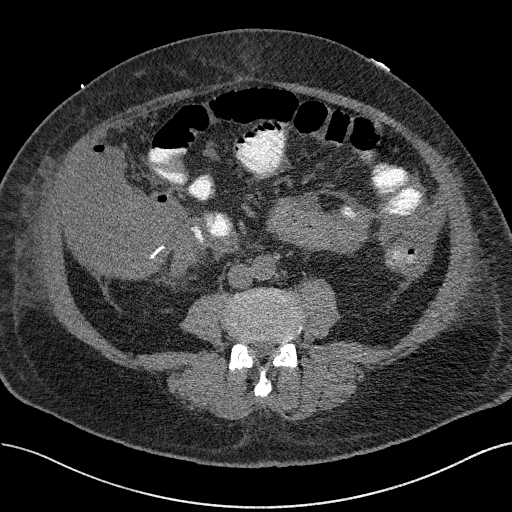
[im 430/597  bone]
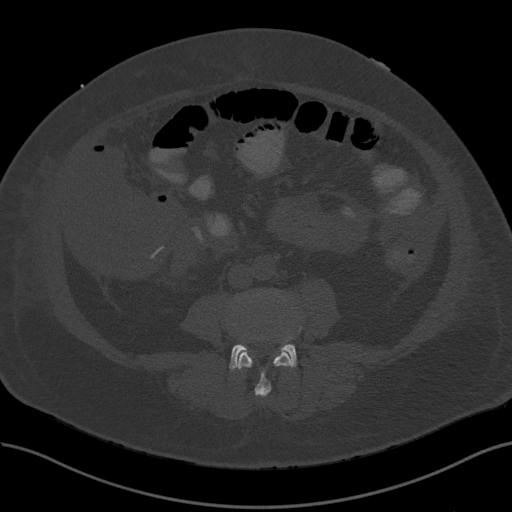
[im 477/597  soft-tissue]
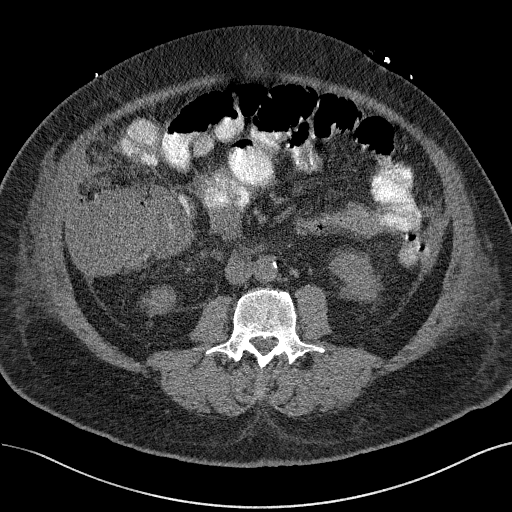
[im 525/597  soft-tissue]
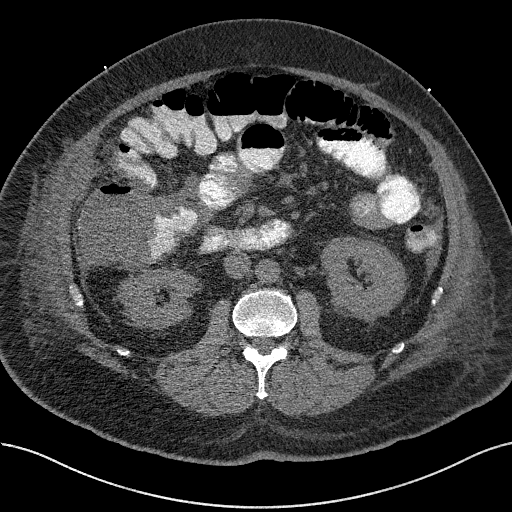
[im 573/597  soft-tissue]
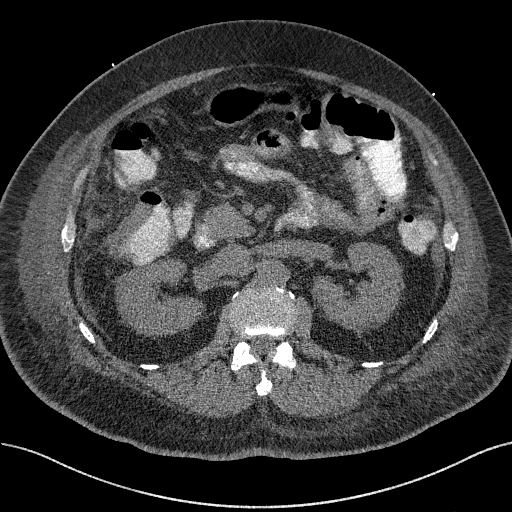

[15 of 46 positions shown; findings below may reference images not displayed]

EXAM:
CT GUIDED DRAINAGE OF PERICOLONIC ABSCESS

MEDICATIONS:
The patient is currently admitted to the hospital and receiving
intravenous antibiotics. The antibiotics were administered within an
appropriate time frame prior to the initiation of the procedure.

ANESTHESIA/SEDATION:
None

COMPLICATIONS:
None immediate.
PROCEDURE:
The operative field was prepped with Chlorhexidine in a sterile
fashion, and a sterile drape was applied covering the operative
field. A sterile gown and sterile gloves were used for the
procedure. Local anesthesia was provided with 1% Lidocaine.

Utilizing 1% lidocaine as a local and deep peritoneal anesthetic and
CT fluoroscopic guidance, a 10 French pigtail drainage catheter was
placed into the right lower quadrant air-fluid collection utilizing
a quick stick technique. The catheter was then coiled within the
collection. Aspiration was performed an yielded infected
postoperative hematoma. A sample was sent for laboratory evaluation.
The catheter was then sutured into place utilizing 0 Prolene and ROSHN
ROSHN. Catheter was then dressed in the standard sterile
manner. The catheter was attached to a suction grenade. The patient
tolerated well and returned his room in satisfactory condition.
IMPRESSION: Successful CT-guided percutaneous drain placement in a right lower
quadrant air-fluid collection which represents an infected
postoperative hematoma.

## 2018-09-23 MED ORDER — DEXTROSE 50 % IV SOLN
12.5000 g | INTRAVENOUS | Status: AC
  Administered 2018-09-23: 08:00:00 12.5 g via INTRAVENOUS
  Filled 2018-09-23: qty 50

## 2018-09-23 MED ORDER — MIDAZOLAM HCL 5 MG/5ML IJ SOLN
INTRAMUSCULAR | Status: AC
  Filled 2018-09-23: qty 5

## 2018-09-23 MED ORDER — IOHEXOL 240 MG/ML SOLN
50.0000 mL | INTRAMUSCULAR | Status: AC
  Administered 2018-09-23 (×2): 50 mL via ORAL

## 2018-09-23 MED ORDER — DEXTROSE 50 % IV SOLN
INTRAVENOUS | Status: AC
  Filled 2018-09-23: qty 50

## 2018-09-23 MED ORDER — ACETAMINOPHEN 650 MG RE SUPP
650.0000 mg | RECTAL | Status: DC | PRN
  Administered 2018-09-23 (×2): 650 mg via RECTAL
  Filled 2018-09-23 (×2): qty 1

## 2018-09-23 MED ORDER — FENTANYL CITRATE (PF) 100 MCG/2ML IJ SOLN
INTRAMUSCULAR | Status: AC
  Filled 2018-09-23: qty 4

## 2018-09-23 MED ORDER — SODIUM CHLORIDE 0.9% FLUSH
10.0000 mL | Freq: Every day | INTRAVENOUS | Status: DC
  Administered 2018-09-23 – 2018-09-26 (×4): 10 mL

## 2018-09-23 MED ORDER — DEXTROSE-NACL 5-0.9 % IV SOLN
INTRAVENOUS | Status: DC
  Administered 2018-09-23: 12:00:00 via INTRAVENOUS

## 2018-09-23 MED ORDER — DEXTROSE 50 % IV SOLN
12.5000 g | INTRAVENOUS | Status: AC
  Administered 2018-09-23: 11:00:00 12.5 g via INTRAVENOUS

## 2018-09-23 MED ORDER — PIPERACILLIN-TAZOBACTAM 3.375 G IVPB
3.3750 g | Freq: Three times a day (TID) | INTRAVENOUS | Status: DC
  Administered 2018-09-23: 3.375 g via INTRAVENOUS
  Filled 2018-09-23: qty 50

## 2018-09-23 MED ORDER — SODIUM CHLORIDE 0.9 % IV SOLN
INTRAVENOUS | Status: DC | PRN
  Administered 2018-09-26: 06:00:00 250 mL via INTRAVENOUS

## 2018-09-23 MED ORDER — PIPERACILLIN-TAZOBACTAM 3.375 G IVPB
3.3750 g | Freq: Two times a day (BID) | INTRAVENOUS | Status: DC
  Administered 2018-09-23 – 2018-09-25 (×4): 3.375 g via INTRAVENOUS
  Filled 2018-09-23 (×4): qty 50

## 2018-09-23 NOTE — TOC Initial Note (Addendum)
Transition of Care [facility]) - Initial/Assessment Note    Patient Details  Name: Phillip [name] MRN: [id number] Date of Birth: 10-11-1955  Transition of Care [facility]) CM/SW Contact:    Beverly Sessions, RN Phone Number: 09/23/2018, 10:39 AM  Clinical Narrative:                  Patient readmitted with RLQ abscess 15 days s/p laparoscopic appendectomy.  Patient lives at home with wife.  PCP Bea Graff.  Pharmacy [facility] in [place].  Patient discharged on 4/20 with home health services through [facility] and PT. Patient was not opened prior to readmission on 4/21.  Corene Cornea with [facility] notified of admission.  Baseline patient ambulates independently.  Has a RW in the home.    Expected Discharge Plan: [place] Barriers to Discharge: Continued Medical Work up   Patient Goals and CMS Choice        Expected Discharge Plan and Services Expected Discharge Plan: [place]   Discharge Planning Services: CM Consult   Living arrangements for the past 2 months: Single Family Home Expected Discharge Date: 09/25/18                   HH Arranged: RN, PT [place] Agency: [place] ([facility])  Prior Living Arrangements/Services Living arrangements for the past 2 months: Single Family Home Lives with:: Spouse              Current home services: Home PT, Home RN, DME    Activities of Daily Living Home Assistive Devices/Equipment: None ADL Screening (condition at time of admission) Patient's cognitive ability adequate to safely complete daily activities?: Yes Is the patient deaf or have difficulty hearing?: No Does the patient have difficulty seeing, even when wearing glasses/contacts?: No Does the patient have difficulty concentrating, remembering, or making decisions?: No Patient able to express need for assistance with ADLs?: No Does the patient have difficulty dressing or bathing?: No Independently performs ADLs?:  No Communication: Independent Dressing (OT): Needs assistance Is this a change from baseline?: Pre-admission baseline Grooming: Independent Feeding: Independent Bathing: Needs assistance Is this a change from baseline?: Pre-admission baseline Toileting: Needs assistance Is this a change from baseline?: Pre-admission baseline In/Out Bed: Needs assistance Is this a change from baseline?: Pre-admission baseline Does the patient have difficulty walking or climbing stairs?: Yes Weakness of Legs: Both Weakness of Arms/Hands: None  Permission Sought/Granted                  Emotional Assessment Appearance:: Appears stated age            Admission diagnosis:  Intra-abdominal abscess ([place]) [K65.1] Sepsis, due to unspecified organism, unspecified whether acute organ dysfunction present [facility]) [A41.9] Patient Active Problem List   Diagnosis Date Noted  . Abscess of appendix 09/22/2018  . Appendicitis 09/08/2018  . Foot ulcer ([place]) 04/24/2018  . Diabetes mellitus without complication ([place]) 54/00/8676  . Hypertension 02/24/2018  . CHF (congestive heart failure) ([place]) 02/24/2018  . Atherosclerosis of native arteries of the extremities with ulceration ([place]) 02/24/2018   PCP:  Raina Mina., MD Pharmacy:   [facility], [address] [place] [address] Phone: [phone] Fax: [place], [place] - [address] [place]  [address] Phone: [phone] Fax: [phone]  Social Determinants of Health (SDOH) Interventions    Readmission Risk Interventions Readmission Risk Prevention Plan 09/21/2018  Transportation Screening Complete  PCP or Specialist Appt within 3-5 Days Complete  HRI or Home Care Consult Complete  Palliative Care Screening Not Applicable  Medication Review (RN Care Manager) Complete  Some recent data might be hidden

## 2018-09-23 NOTE — Progress Notes (Signed)
Inpatient Diabetes Program Recommendations  AACE/ADA: New Consensus Statement on Inpatient Glycemic Control (2015)  Target Ranges:  Prepandial:   less than 140 mg/dL      Peak postprandial:   less than 180 mg/dL (1-2 hours)      Critically ill patients:  140 - 180 mg/dL   Lab Results  Component Value Date   GLUCAP 45 (L) 09/23/2018   HGBA1C 7.1 (H) 09/12/2018    Review of Glycemic Control Results for Phillip [name], Phillip [name] (MRN [id number]) as of 09/23/2018 11:33  Ref. Range 09/21/2018 07:28 09/21/2018 11:37 09/22/2018 20:54 09/23/2018 07:51 09/23/2018 08:32 09/23/2018 11:21  Glucose-Capillary Latest Ref Range: 70 - 99 mg/dL 110 (H) 146 (H) 95 57 (L) 92 45 (L)   Diabetes history: DM 2 Outpatient Diabetes medications:  Glucotrol XL 10 mg daily, Levemir 40 units q AM and 80 units q evening Current orders for Inpatient glycemic control:  Novolog moderate tid with meals and HS Inpatient Diabetes Program Recommendations:    Note low blood sugars. Patient was on basal insulin prior to admit which possibly could be contributing to low blood sugars. Agree with current orders.   Thanks,  Adah Perl, RN, BC-ADM Inpatient Diabetes Coordinator Pager [phone] (8a-5p)

## 2018-09-23 NOTE — Procedures (Signed)
CT drain of post op infected hematoma at site of previous appendectomy  Complications:  None  Blood Loss: none  See dictation in canopy pacs

## 2018-09-23 NOTE — Progress Notes (Signed)
[facility] Day(s): 1.   Post op day(s):  Marland Kitchen   Interval History: Continued to have fevers overnight, t-max of 101.9. WBC improved slightly to 14.2. Hgb to 7.6 pending transfusion 1 unit pRBCs this morning. Renal function stable (sCr - 4.86) still making urine. Otherwise, he is noting some abdominal discomfort and distension this morning. No complaints of CP, SOB, nausea, or emesis. He is still having BMs including this morning. Plan for IR drainage this morning.   Review of Systems:  Constitutional: + fever Respiratory: denies any shortness of breath  Cardiovascular: denies chest pain or palpitations  Gastrointestinal: + abdominal pain/distension, denied N/V, or diarrhea/and bowel function as per interval history Integumentary: + Laparoscopy incisions   Vital signs in last 24 hours: [min-max] current  Temp:  [98.8 F (37.1 C)-102.8 F (39.3 C)] 101.9 F (38.8 C) (04/22 0716) Pulse Rate:  [78-119] 119 (04/22 0716) Resp:  [16-30] 22 (04/22 0716) BP: (100-147)/(52-80) 121/69 (04/22 0716) SpO2:  [94 %-98 %] (P) 97 % (04/22 0716) Weight:  [108.9 kg] 108.9 kg (04/21 1310)     Height: 6\' 3"  (190.5 cm) Weight: 108.9 kg BMI (Calculated): 30   Intake/Output this shift:  No intake/output data recorded.    Physical Exam:  Constitutional: alert, cooperative and no distress  Respiratory: breathing non-labored at rest  Cardiovascular: regular rate and sinus rhythm  Gastrointestinal: soft, non-tender,mild distension. No rebound/guarding Integumentary: Laparoscopic incisions are CDI, there is surrounding ecchymosis, no erythema or drainage   Labs:  CBC Latest Ref Rng & Units 09/23/2018 09/22/2018 09/21/2018  WBC 4.0 - 10.5 K/uL 14.2(H) 16.3(H) 21.3(H)  Hemoglobin 13.0 - 17.0 g/dL 7.6(L) 7.5(L) 9.3(L)  Hematocrit 39.0 - 52.0 % 25.7(L) 24.5(L) 31.0(L)  Platelets 150 - 400 K/uL 288 292 365   CMP Latest Ref Rng & Units 09/23/2018 09/22/2018  09/21/2018  Glucose 70 - 99 mg/dL 62(L) 130(H) 111(H)  BUN 8 - 23 mg/dL 63(H) 64(H) 64(H)  Creatinine 0.61 - 1.24 mg/dL 4.86(H) 4.68(H) 4.24(H)  Sodium 135 - 145 mmol/L 142 139 141  Potassium 3.5 - 5.1 mmol/L 4.2 4.7 4.6  Chloride 98 - 111 mmol/L 106 104 102  CO2 22 - 32 mmol/L 24 25 27   Calcium 8.9 - 10.3 mg/dL 8.1(L) 7.9(L) 8.2(L)  Total Protein 6.5 - 8.1 g/dL - 6.8 -  Total Bilirubin 0.3 - 1.2 mg/dL - 1.0 -  Alkaline Phos 38 - 126 U/L - 50 -  AST 15 - 41 U/L - 31 -  ALT 0 - 44 U/L - 11 -     Imaging studies:   Echocardiogram from 09/15/2018 report reviewed, this was done during last hospitalization and there has been no significant acute cardiac changes in last 8 days:  IMPRESSIONS  1. The left ventricle has low normal systolic function, with an ejection fraction of 50-55%. The cavity size was normal. There is moderate concentric left ventricular hypertrophy. Left ventricular diastolic Doppler parameters are consistent with  impaired relaxation.  2. The right ventricle has normal systolic function. The cavity was mildly enlarged. There is no increase in right ventricular wall thickness.  3. Left atrial size was mildly dilated.  4. Right atrial size was mildly dilated.  5. The tricuspid valve is not well visualized. Tricuspid valve regurgitation is mild-moderate.  6. The aortic valve is abnormal. Moderate thickening of the aortic valve. Moderate calcification of the aortic valve. Aortic valve regurgitation is trivial by color flow Doppler. Mild-moderate stenosis of the aortic valve.  7. The interatrial septum was not assessed.   Assessment/Plan: (ICD-10's: K35.80) 63 y.o. male re-admitted with sepsis secondary to new developing RLQ abscess 15 days s/p laparoscopic appendectomy for acute appendicitis for which his post-operative course has been complicated by acute on chronic renal failure, failure to thrive, persistent leukocytosis.    - NPO + IVF + IV Abx (Zosyn)   - pain control  prn; antiemetics prn  - Monitor abdominal examination  - Plan for percutaneous drain placement with IR this morning; will get repeat CT Abdomen/pelvis with PO contrast to further distinguish possible abscess  - No indication for emergent surgical intervention at this time  - Monitor leukocytosis; morning CBC  - Monitor Hgb; 1 unit pRBC transfusion pending  - Monitor renal function; U/O; nephrology consult pending  - Medical management of comorbidities; appreciate hospitalist help   - Hold anticoagulation   All of the above findings and recommendations were discussed with the patient, and the medical team, and all of patient's questions were answered to his expressed satisfaction.  -- Edison Simon, PA-C [facility] 09/23/2018, 7:40 AM [phone] M-F: 7am - 4pm

## 2018-09-23 NOTE — Sedation Documentation (Signed)
Report given to annabella rn

## 2018-09-23 NOTE — Progress Notes (Signed)
[facility], [place] 09/23/18  Subjective:   Patient known to our practice from recent admission Was discharged on April 20.  He returns via EMS from home after having fever.  Family also reported some confusion This morning, he is alert, sitting up on the side of the bed. Able to answer questions.  Gives short answers Continues to have some leg edema Denies any acute shortness of breath Plan for interventional radiology to place a drain in the right lower quadrant  Objective:  Vital signs in last 24 hours:  Temp:  [98.8 F (37.1 C)-102.8 F (39.3 C)] 99.8 F (37.7 C) (04/22 0920) Pulse Rate:  [78-119] 98 (04/22 1315) Resp:  [16-34] 34 (04/22 1315) BP: (100-147)/(52-86) 140/81 (04/22 1315) SpO2:  [94 %-99 %] 99 % (04/22 1315)  Weight change:  Filed Weights   09/22/18 1310  Weight: 108.9 kg    Intake/Output:    Intake/Output Summary (Last 24 hours) at 09/23/2018 1328 Last data filed at 09/23/2018 0920 Gross per 24 hour  Intake 705.78 ml  Output 0 ml  Net 705.78 ml     Physical Exam: General:  Sitting up on the side of the bed, no acute distress  HEENT  moist oral mucous membranes  Neck  supple, no masses  Pulm/lungs  normal breathing effort on room air  CVS/Heart  regular rhythm  Abdomen:   Soft  Extremities:  2+ pitting edema bilaterally  Neurologic:  Alert, able to follow commands  Skin:  No acute rashes    Basic Metabolic Panel:  Recent Labs  Lab 09/18/18 0549 09/19/18 0445 09/20/18 0529 09/21/18 0347 09/22/18 1313 09/23/18 0534  NA 140 143 139 141 139 142  K 3.6 3.9 4.1 4.6 4.7 4.2  CL 96* 101 100 102 104 106  CO2 31 32 27 27 25 24   GLUCOSE 150* 156* 107* 111* 130* 62*  BUN 72* 65* 63* 64* 64* 63*  CREATININE 4.49* 4.16* 4.19* 4.24* 4.68* 4.86*  CALCIUM 8.0* 8.1* 8.0* 8.2* 7.9* 8.1*  MG 2.1  --  2.1  --   --  2.3  PHOS  --   --  4.0  --   --  5.0*     CBC: Recent Labs  Lab 09/19/18 0445 09/20/18 0529  09/21/18 0347 09/22/18 1313 09/23/18 0534  WBC 15.6* 20.0* 21.3* 16.3* 14.2*  NEUTROABS  --   --   --  13.9*  --   HGB 9.1* 9.3* 9.3* 7.5* 7.6*  HCT 29.6* 30.2* 31.0* 24.5* 25.7*  MCV 90.8 90.7 91.7 90.4 92.4  PLT 307 312 365 292 288     No results found for: HEPBSAG, HEPBSAB, HEPBIGM    Microbiology:  Recent Results (from the past 240 hour(s))  Culture, blood (routine x 2)     Status: None   Collection Time: 09/14/18  9:02 PM  Result Value Ref Range Status   Specimen Description BLOOD BLOOD RIGHT HAND  Final   Special Requests   Final    BOTTLES DRAWN AEROBIC AND ANAEROBIC Blood Culture adequate volume   Culture   Final    NO GROWTH 5 DAYS Performed at [facility], [address], [place], [place] [address]    Report Status 09/19/2018 FINAL  Final  Culture, blood (routine x 2)     Status: None   Collection Time: 09/14/18  9:09 PM  Result Value Ref Range Status   Specimen Description BLOOD RIGHT ANTECUBITAL  Final   Special Requests  Final    BOTTLES DRAWN AEROBIC AND ANAEROBIC Blood Culture adequate volume   Culture   Final    NO GROWTH 5 DAYS Performed at [facility], [address] [place], [place] [address]    Report Status 09/19/2018 FINAL  Final  C difficile quick scan w PCR reflex     Status: None   Collection Time: 09/16/18  6:39 PM  Result Value Ref Range Status   C Diff antigen NEGATIVE NEGATIVE Final   C Diff toxin NEGATIVE NEGATIVE Final   C Diff interpretation No C. difficile detected.  Final    Comment: Performed at [facility], [address] [place], [place] [address]  Blood Culture (routine x 2)     Status: None (Preliminary result)   Collection Time: 09/22/18  1:14 PM  Result Value Ref Range Status   Specimen Description BLOOD BLOOD LEFT WRIST  Final   Special Requests   Final    BOTTLES DRAWN AEROBIC AND ANAEROBIC Blood Culture adequate volume   Culture   Final    NO GROWTH < 24 HOURS Performed at [facility], [address], [place], [place] [address]    Report Status PENDING  Incomplete  Blood Culture (routine x 2)     Status: None (Preliminary result)   Collection Time: 09/22/18  1:38 PM  Result Value Ref Range Status   Specimen Description BLOOD RIGHT HAND  Final   Special Requests   Final    BOTTLES DRAWN AEROBIC AND ANAEROBIC Blood Culture results may not be optimal due to an inadequate volume of blood received in culture bottles   Culture   Final    NO GROWTH < 24 HOURS Performed at [facility], [address], [place], [place] [address]    Report Status PENDING  Incomplete  Urine culture     Status: None   Collection Time: 09/22/18  5:42 PM  Result Value Ref Range Status   Specimen Description   Final    URINE, RANDOM Performed at [facility], [address], [place], [place] [address]    Special Requests   Final    NONE Performed at [facility], [address], [place], [place] [address]    Culture   Final    NO GROWTH Performed at [facility], [place] [address], [place],  [address]    Report Status 09/23/2018 FINAL  Final    Coagulation Studies: Recent Labs    09/22/18 1742 09/23/18 0534  LABPROT 15.6* 15.2  INR 1.3* 1.2    Urinalysis: Recent Labs    09/22/18 1748  COLORURINE YELLOW*  LABSPEC 1.016  PHURINE 5.0  GLUCOSEU NEGATIVE  HGBUR SMALL*  BILIRUBINUR NEGATIVE  KETONESUR NEGATIVE  PROTEINUR 100*  NITRITE NEGATIVE  LEUKOCYTESUR NEGATIVE      Imaging: Ct Abdomen Pelvis Wo Contrast  Result Date: 09/22/2018 CLINICAL DATA:  Fever status post appendectomy last week. EXAM: CT ABDOMEN AND PELVIS WITHOUT CONTRAST TECHNIQUE: Multidetector CT imaging of the abdomen and pelvis was performed following the standard protocol without IV contrast. COMPARISON:  CT scan of September 15, 2018. FINDINGS: Lower chest: No acute abnormality. Hepatobiliary: Cholelithiasis is noted. No biliary dilatation is noted.  Stable small amount of free fluid is noted around the liver. No focal hepatic abnormality is noted on these unenhanced images. Pancreas: Unremarkable. No pancreatic ductal dilatation or surrounding inflammatory changes. Spleen: There is interval development of mild amount of fluid around the spleen which  demonstrates Hounsfield measurement of 22 concerning for possible hemorrhage. Adrenals/Urinary Tract: Adrenal glands are unremarkable. Kidneys are normal, without renal calculi, focal lesion, or hydronephrosis. Bladder is unremarkable. Stomach/Bowel: The stomach appears normal. Status post appendectomy. There is no evidence of bowel obstruction. There is noted 10.7 x 6.9 cm abnormality in the right lower quadrant concerning for abscess or hematoma. No definite bowel dilatation is noted. Vascular/Lymphatic: Aortic atherosclerosis. No enlarged abdominal or pelvic lymph nodes. Reproductive: Prostate is unremarkable. Other: No abnormal hernia is noted. There is also noted stable hyperdense fluid in the pelvis. Musculoskeletal: Stable grade 1 anterolisthesis of L5-S1 is noted secondary to bilateral L5 pars defects. IMPRESSION: Interval development of 10.7 x 6.9 cm probable abscess or less likely hematoma seen in the right lower quadrant of the abdomen. Stable amount of probable intraperitoneal hemorrhage is noted in the pelvis and around the liver, with new fluid noted around the spleen. Critical Value/emergent results were called by telephone at the time of interpretation on 09/22/2018 at 2:20 pm to Dr. Merlyn Lot , who verbally acknowledged these results. Cholelithiasis is noted. Aortic Atherosclerosis (ICD10-I70.0). Electronically Signed   By: Marijo Conception M.D.   On: 09/22/2018 14:21   Dg Chest Portable 1 View  Result Date: 09/22/2018 CLINICAL DATA:  Laparoscopic appendectomy on 09/08/2018. Patient presents with fever, shortness of breath and acute mental status changes that began yesterday. Former smoker.  EXAM: PORTABLE CHEST 1 VIEW COMPARISON:  09/15/2018 and earlier. FINDINGS: Sternotomy for CABG. Cardiac silhouette moderately enlarged. Chronic elevation of the RIGHT hemidiaphragm and chronic scar/atelectasis at the RIGHT lung base. Pulmonary venous hypertension and minimal to mild interstitial pulmonary edema, new since the prior exams. No confluent airspace consolidation. No visible pleural effusions. IMPRESSION: Minimal to mild CHF, with moderate cardiomegaly and minimal to mild diffuse interstitial pulmonary edema. Electronically Signed   By: Evangeline Dakin M.D.   On: 09/22/2018 13:32     Medications:   . sodium chloride    . dextrose 5 % and 0.9% NaCl 75 mL/hr at 09/23/18 1139  . piperacillin-tazobactam (ZOSYN)  IV     . acetaminophen  1,000 mg Oral Q6H  . carvedilol  6.25 mg Oral BID WC  . dextrose      . fentaNYL      . insulin aspart  0-15 Units Subcutaneous TID WC  . insulin aspart  0-5 Units Subcutaneous QHS  . midazolam      . pantoprazole (PROTONIX) IV  40 mg Intravenous QHS   sodium chloride, acetaminophen, acetaminophen, hydrALAZINE, morphine injection, ondansetron **OR** ondansetron (ZOFRAN) IV, oxyCODONE  Assessment/ Plan:  63 y.o. African-American male  with diabetes mellitus type II, diabetic neuropathy, hypertension, coronary artery disease status post CABG, peripheral vascular disease, status post left toe amputation, congestive heart failure, asthma, who was admitted to [facility] on[facility]/2020for Acute appendicitis.  Hospital course complicated by acute renal failure and right lower quadrant abscess.  Patient was discharged on 4/20.  Return on 4/21 for altered mental status  1. Acute renal failure on chronic kidney disease stage III with proteinuria: baseline creatinine of 2.23, GFR of 35 on 08/10/2018.  Chronic Kidney Disease secondary to diabetic nephropathy Acute renal failure secondary to ATN from IV contrast nephropathy (4/7) and possibly from acute illness, acute  appendicitis.    - Holding lisinopril, furosemide, spironolactone, potassium chloride, and metolazone.  -Renal function remains compromised. -We will see if draining the abscess resolves and improvement in renal function GFR is 14.  Patient is getting close to  dialysis however electrolytes and volume status are acceptable.  No acute indication of dialysis at present  2. Hypertension with CKD: Blood pressure control is acceptable3. Diabetes mellitus type II with chronic kidney disease: insulin dependent. not well controlled. Hemoglobin A1c of 9.1% on 01/14/18 Currently on carvedilol, hydralazine (PRN) Avoid ACE inhibitor at this time  4. Anemia with renal failure:  Hemoglobin currently 7.6 Continue to monitor closely   LOS: [place] 4/22/20201:28 PM  [place], [place]  Note: This note was prepared with Dragon dictation. Any transcription errors are unintentional

## 2018-09-23 NOTE — Progress Notes (Signed)
[place] [facility]                                                                                                                                                                                  Patient Demographics   Phillip [name], is a 63 y.o. male, DOB - [birth year], GDJ:[id number]  Admit date - 09/22/2018   Admitting Physician Jules Husbands, MD  Outpatient Primary MD for the patient is Raina Mina., MD   LOS - 1  Subjective: Patient denies any abdominal pain this morning states that he is feeling little better    Review of Systems:   CONSTITUTIONAL: No documented fever. No fatigue, weakness. No weight gain, no weight loss.  EYES: No blurry or double vision.  ENT: No tinnitus. No postnasal drip. No redness of the oropharynx.  RESPIRATORY: No cough, no wheeze, no hemoptysis. No dyspnea.  CARDIOVASCULAR: No chest pain. No orthopnea. No palpitations. No syncope.  GASTROINTESTINAL: No nausea, no vomiting or diarrhea. No abdominal pain. No melena or hematochezia.  GENITOURINARY: No dysuria or hematuria.  ENDOCRINE: No polyuria or nocturia. No heat or cold intolerance.  HEMATOLOGY: No anemia. No bruising. No bleeding.  INTEGUMENTARY: No rashes. No lesions.  MUSCULOSKELETAL: No arthritis. No swelling. No gout.  NEUROLOGIC: No numbness, tingling, or ataxia. No seizure-type activity.  PSYCHIATRIC: No anxiety. No insomnia. No ADD.    Vitals:   Vitals:   09/23/18 0635 09/23/18 0716 09/23/18 0752 09/23/18 0920  BP: 133/72 121/69 125/71 136/70  Pulse: (!) 102 (!) 119 95 (!) 101  Resp: (!) 22 (!) 22 20 (!) 24  Temp: (!) 102.8 F (39.3 C) (!) 101.9 F (38.8 C) 98.8 F (37.1 C) 99.8 F (37.7 C)  TempSrc: Oral Oral Oral Oral  SpO2: 96% 97% 96% 95%  Weight:      Height:        Wt Readings from Last 3 Encounters:  09/22/18 108.9 kg  09/08/18 110.2 kg  04/24/18 98.6 kg     Intake/Output Summary (Last 24 hours) at 09/23/2018 1034 Last data  filed at 09/23/2018 0920 Gross per 24 hour  Intake 705.78 ml  Output 0 ml  Net 705.78 ml    Physical Exam:   GENERAL: Pleasant-appearing in no apparent distress.  HEAD, EYES, EARS, NOSE AND THROAT: Atraumatic, normocephalic. Extraocular muscles are intact. Pupils equal and reactive to light. Sclerae anicteric. No conjunctival injection. No oro-pharyngeal erythema.  NECK: Supple. There is no jugular venous distention. No bruits, no lymphadenopathy, no thyromegaly.  HEART: Regular rate and rhythm,. No murmurs, no rubs, no clicks.  LUNGS: Clear to auscultation bilaterally. No rales or rhonchi. No wheezes.  ABDOMEN: Soft, flat, nontender, nondistended. Has good bowel sounds. No hepatosplenomegaly appreciated.  EXTREMITIES: No evidence of any cyanosis, clubbing, or peripheral edema.  +2 pedal and radial pulses bilaterally.  NEUROLOGIC: The patient is alert, awake, and oriented x3 with no focal motor or sensory deficits appreciated bilaterally.  SKIN: Moist and warm with no rashes appreciated.  Psych: Not anxious, depressed LN: No inguinal LN enlargement    Antibiotics   Anti-infectives (From admission, onward)   Start     Dose/Rate Route Frequency Ordered Stop   09/23/18 2200  piperacillin-tazobactam (ZOSYN) IVPB 3.375 g     3.375 g 12.5 mL/hr over 240 Minutes Intravenous Every 12 hours 09/23/18 0859     09/23/18 0500  piperacillin-tazobactam (ZOSYN) IVPB 3.375 g  Status:  Discontinued     3.375 g 12.5 mL/hr over 240 Minutes Intravenous Every 8 hours 09/23/18 0220 09/23/18 0859   09/22/18 1845  piperacillin-tazobactam (ZOSYN) IVPB 3.375 g  Status:  Discontinued     3.375 g 12.5 mL/hr over 240 Minutes Intravenous Every 8 hours 09/22/18 1831 09/23/18 0220   09/22/18 1430  piperacillin-tazobactam (ZOSYN) IVPB 3.375 g  Status:  Discontinued     3.375 g 100 mL/hr over 30 Minutes Intravenous  Once 09/22/18 1428 09/22/18 1429   09/22/18 1430  cefTRIAXone (ROCEPHIN) 1 g in sodium chloride 0.9  % 100 mL IVPB     1 g 200 mL/hr over 30 Minutes Intravenous  Once 09/22/18 1429 09/22/18 1508   09/22/18 1430  metroNIDAZOLE (FLAGYL) IVPB 500 mg     500 mg 100 mL/hr over 60 Minutes Intravenous  Once 09/22/18 1429 09/22/18 1610      Medications   Scheduled Meds: . acetaminophen  1,000 mg Oral Q6H  . carvedilol  6.25 mg Oral BID WC  . insulin aspart  0-15 Units Subcutaneous TID WC  . insulin aspart  0-5 Units Subcutaneous QHS  . insulin aspart  3 Units Subcutaneous TID WC  . pantoprazole (PROTONIX) IV  40 mg Intravenous QHS   Continuous Infusions: . sodium chloride Stopped (09/23/18 0507)  . sodium chloride    . piperacillin-tazobactam (ZOSYN)  IV     PRN Meds:.sodium chloride, acetaminophen, acetaminophen, hydrALAZINE, morphine injection, ondansetron **OR** ondansetron (ZOFRAN) IV, oxyCODONE   Data Review:   Micro Results Recent Results (from the past 240 hour(s))  Culture, blood (routine x 2)     Status: None   Collection Time: 09/14/18  9:02 PM  Result Value Ref Range Status   Specimen Description BLOOD BLOOD RIGHT HAND  Final   Special Requests   Final    BOTTLES DRAWN AEROBIC AND ANAEROBIC Blood Culture adequate volume   Culture   Final    NO GROWTH 5 DAYS Performed at [facility], [address], [place], [place] [address]    Report Status 09/19/2018 FINAL  Final  Culture, blood (routine x 2)     Status: None   Collection Time: 09/14/18  9:09 PM  Result Value Ref Range Status   Specimen Description BLOOD RIGHT ANTECUBITAL  Final   Special Requests   Final    BOTTLES DRAWN AEROBIC AND ANAEROBIC Blood Culture adequate volume   Culture   Final    NO GROWTH 5 DAYS Performed at [facility], [address], [place], [place] [address]    Report Status 09/19/2018 FINAL  Final  C difficile quick scan w PCR reflex     Status: None   Collection Time: 09/16/18  6:39 PM  Result Value Ref Range Status   C Diff antigen NEGATIVE NEGATIVE Final   C  Diff toxin NEGATIVE NEGATIVE Final   C Diff interpretation No C. difficile detected.  Final    Comment: Performed at [facility], [address] [place], [place] [address]  Blood Culture (routine x 2)     Status: None (Preliminary result)   Collection Time: 09/22/18  1:14 PM  Result Value Ref Range Status   Specimen Description BLOOD BLOOD LEFT WRIST  Final   Special Requests   Final    BOTTLES DRAWN AEROBIC AND ANAEROBIC Blood Culture adequate volume   Culture   Final    NO GROWTH < 24 HOURS Performed at [facility], [address] [place], [place] [address]    Report Status PENDING  Incomplete  Blood Culture (routine x 2)     Status: None (Preliminary result)   Collection Time: 09/22/18  1:38 PM  Result Value Ref Range Status   Specimen Description BLOOD RIGHT HAND  Final   Special Requests   Final    BOTTLES DRAWN AEROBIC AND ANAEROBIC Blood Culture results may not be optimal due to an inadequate volume of blood received in culture bottles   Culture   Final    NO GROWTH < 24 HOURS Performed at [facility], [address], [place], [place] [address]    Report Status PENDING  Incomplete    Radiology Reports Ct Abdomen Pelvis Wo Contrast  Result Date: 09/22/2018 CLINICAL DATA:  Fever status post appendectomy last week. EXAM: CT ABDOMEN AND PELVIS WITHOUT CONTRAST TECHNIQUE: Multidetector CT imaging of the abdomen and pelvis was performed following the standard protocol without IV contrast. COMPARISON:  CT scan of September 15, 2018. FINDINGS: Lower chest: No acute abnormality. Hepatobiliary: Cholelithiasis is noted. No biliary dilatation is noted. Stable small amount of free fluid is noted around the liver. No focal hepatic abnormality is noted on these unenhanced images. Pancreas: Unremarkable. No pancreatic ductal dilatation or surrounding inflammatory changes. Spleen: There is interval development of mild amount of fluid around the spleen which  demonstrates Hounsfield measurement of 22 concerning for possible hemorrhage. Adrenals/Urinary Tract: Adrenal glands are unremarkable. Kidneys are normal, without renal calculi, focal lesion, or hydronephrosis. Bladder is unremarkable. Stomach/Bowel: The stomach appears normal. Status post appendectomy. There is no evidence of bowel obstruction. There is noted 10.7 x 6.9 cm abnormality in the right lower quadrant concerning for abscess or hematoma. No definite bowel dilatation is noted. Vascular/Lymphatic: Aortic atherosclerosis. No enlarged abdominal or pelvic lymph nodes. Reproductive: Prostate is unremarkable. Other: No abnormal hernia is noted. There is also noted stable hyperdense fluid in the pelvis. Musculoskeletal: Stable grade 1 anterolisthesis of L5-S1 is noted secondary to bilateral L5 pars defects. IMPRESSION: Interval development of 10.7 x 6.9 cm probable abscess or less likely hematoma seen in the right lower quadrant of the abdomen. Stable amount of probable intraperitoneal hemorrhage is noted in the pelvis and around the liver, with new fluid noted around the spleen. Critical Value/emergent results were called by telephone at the time of interpretation on 09/22/2018 at 2:20 pm to Dr. Merlyn Lot , who verbally acknowledged these results. Cholelithiasis is noted. Aortic Atherosclerosis (ICD10-I70.0). Electronically Signed   By: Marijo Conception M.D.   On: 09/22/2018 14:21   Ct Abdomen Pelvis Wo Contrast  Result Date: 09/15/2018 CLINICAL DATA:  Fever.  Status post appendectomy. EXAM: CT ABDOMEN AND PELVIS WITHOUT CONTRAST TECHNIQUE: Multidetector CT imaging of the abdomen and  pelvis was performed following the standard protocol without IV contrast. COMPARISON:  09/11/2018 FINDINGS: Lower chest: Bibasilar atelectasis.  Small right pleural effusion. Hepatobiliary: Multiple gallstones.  Unremarkable liver. Pancreas: Unremarkable Spleen: Unremarkable Adrenals/Urinary Tract: Kidneys and adrenal  glands are within normal limits. Foley catheter is positioned in the bladder. Stomach/Bowel: Stomach is distended with contrast. Dilated proximal small bowel loops are stable. Distal small bowel loops are decompressed. Overall small bowel distension has slightly improved. Colon is also decompressed. Vascular/Lymphatic: Mild aortic and iliac artery vascular calcifications. No abnormal retroperitoneal adenopathy. Reproductive: Prostate is small. Other: Hyperdense free fluid within the lower abdomen and pelvis is not significantly changed. Small amount of hyperdense fluid about the liver is stable. Musculoskeletal: Bilateral L5 pars defects and grade 1 spondylolisthesis. No vertebral compression deformity. IMPRESSION: Dilated proximal small bowel loops have slightly improved. Distal small bowel loops in colon remain decompressed. This suggest an improving small bowel obstruction pattern. Stable hyperdense fluid within the lower abdomen and pelvis most consistent with intraperitoneal hemorrhage. Electronically Signed   By: Marybelle Killings M.D.   On: 09/15/2018 07:54   Ct Abdomen Pelvis Wo Contrast  Result Date: 09/11/2018 CLINICAL DATA:  Postop appendectomy.  Abdominal distention. EXAM: CT ABDOMEN AND PELVIS WITHOUT CONTRAST TECHNIQUE: Multidetector CT imaging of the abdomen and pelvis was performed following the standard protocol without IV contrast. COMPARISON:  09/08/2018 FINDINGS: Lower chest: Cardiomegaly. Bibasilar atelectasis. Coronary artery calcifications, status post CABG. Hepatobiliary: Layering gallstones within the bladder. No focal hepatic abnormality. Pancreas: No focal abnormality or ductal dilatation. Spleen: No focal abnormality.  Normal size. Adrenals/Urinary Tract: Foley catheter present in the bladder which is decompressed. No hydronephrosis. Adrenal glands unremarkable. Stomach/Bowel: Dilated small bowel loops. Distal small bowel loops and colon are decompressed. Mild distention of the stomach  with contrast. Vascular/Lymphatic: Aortic atherosclerosis. No enlarged abdominal or pelvic lymph nodes. Reproductive: Mild prostate prominence. Other: Moderate complex free fluid within the abdomen and pelvis, possibly hemoperitoneum. No free air. Musculoskeletal: No acute bony abnormality. Pars defects and degenerative changes at L5 S1. IMPRESSION: Postop from appendectomy. There are mildly dilated small bowel loops. Distal small bowel loops are decompressed as is the colon. This could reflect ileus or small-bowel obstruction. Moderate complex fluid in the abdomen and pelvis, most notable in the pelvis. This may reflect blood/hemoperitoneum. Aortic atherosclerosis. Bibasilar atelectasis. Prostate enlargement. Electronically Signed   By: Rolm Baptise M.D.   On: 09/11/2018 10:18   Dg Chest 1 View  Result Date: 09/15/2018 CLINICAL DATA:  Hypoxia EXAM: CHEST  1 VIEW COMPARISON:  09/14/2018 FINDINGS: Normal heart size. Postoperative changes from CABG. Low volumes and bibasilar atelectasis. No pneumothorax. No pleural effusion. IMPRESSION: Bibasilar atelectasis and low lung volumes. Electronically Signed   By: Marybelle Killings M.D.   On: 09/15/2018 09:02   Dg Abd 1 View  Result Date: 09/10/2018 CLINICAL DATA:  Abdominal distention EXAM: ABDOMEN - 1 VIEW COMPARISON:  09/08/2018 abdominal CT FINDINGS: Gas dilated small bowel with less colonic gas. No concerning mass effect or gas collection. Lung bases are clear. IMPRESSION: Dilated small bowel. Recent surgery suggests ileus, but there is less colonic gas and a degree of mechanical obstruction is also possible. Electronically Signed   By: Monte Fantasia M.D.   On: 09/10/2018 07:25   Ct Abdomen Pelvis W Contrast  Result Date: 09/08/2018 CLINICAL DATA:  63 year old male with a history of right lower quadrant pain EXAM: CT ABDOMEN AND PELVIS WITH CONTRAST TECHNIQUE: Multidetector CT imaging of the abdomen and pelvis was performed using  the standard protocol following  bolus administration of intravenous contrast. CONTRAST:  165mL OMNIPAQUE IOHEXOL 300 MG/ML  SOLN COMPARISON:  None. FINDINGS: Lower chest: No acute finding of the lower chest. 6 mm nodule at the periphery of the left lower lobe (image 11 of series 4). Hepatobiliary: Unremarkable appearance of liver. Cholelithiasis without associated inflammatory changes. Pancreas: Unremarkable Spleen: Unremarkable Adrenals/Urinary Tract: Unremarkable appearance of the adrenal glands. No evidence of hydronephrosis of the right or left kidney. No nephrolithiasis. Unremarkable course of the bilateral ureters. Unremarkable appearance of the urinary bladder. Stomach/Bowel: Unremarkable stomach. Proximal small bowel unremarkable with no focal wall thickening or transition point. Majority of distal small bowel is unremarkable. There is circumferential wall enhancement at the terminal ileum just above the IC valve. Minimal inflammatory changes in the adjacent fat with trace free fluid. Inflammatory changes surround the appendix with fecalized material/gas within the lumen. No evidence of perforation. No calcified appendicoliths. Colon is relatively unremarkable with mild stool burden. Colonic diverticula without associated inflammatory changes. Vascular/Lymphatic: Atherosclerotic calcifications of the abdominal aorta and the bilateral iliac arteries. No adenopathy. Reproductive: Unremarkable pelvic structures Other: No abdominal wall hernia. Musculoskeletal: Bilateral L5 pars defect with grade 1 anterolisthesis and associated degenerative disc disease of L5-S1. Vacuum disc phenomenon at this level. No acute displaced fracture. Degenerative changes of the hips. IMPRESSION: Acute appendicitis, uncomplicated. There is a presumed reactive ileitis of the terminal ileum. These results were discussed by telephone at the time of interpretation on 09/08/2018 at 2:21 pm with Dr. Lenise Arena. Cholelithiasis without evidence of acute cholecystitis.  Nodule at the left lung base, 6 mm. Non-contrast chest CT at 6-12 months is recommended. If the nodule is stable at time of repeat CT, then future CT at 18-24 months (from today's scan) is considered optional for low-risk patients, but is recommended for high-risk patients. This recommendation follows the consensus statement: Guidelines for Management of Incidental Pulmonary Nodules Detected on CT Images: From the Fleischner Society 2017; Radiology 2017; 284:228-243. Electronically Signed   By: Corrie Mckusick D.O.   On: 09/08/2018 14:21   Dg Chest Portable 1 View  Result Date: 09/22/2018 CLINICAL DATA:  Laparoscopic appendectomy on 09/08/2018. Patient presents with fever, shortness of breath and acute mental status changes that began yesterday. Former smoker. EXAM: PORTABLE CHEST 1 VIEW COMPARISON:  09/15/2018 and earlier. FINDINGS: Sternotomy for CABG. Cardiac silhouette moderately enlarged. Chronic elevation of the RIGHT hemidiaphragm and chronic scar/atelectasis at the RIGHT lung base. Pulmonary venous hypertension and minimal to mild interstitial pulmonary edema, new since the prior exams. No confluent airspace consolidation. No visible pleural effusions. IMPRESSION: Minimal to mild CHF, with moderate cardiomegaly and minimal to mild diffuse interstitial pulmonary edema. Electronically Signed   By: Evangeline Dakin M.D.   On: 09/22/2018 13:32   Dg Chest Port 1 View  Result Date: 09/14/2018 CLINICAL DATA:  Fevers EXAM: PORTABLE CHEST 1 VIEW COMPARISON:  03/25/2018 FINDINGS: Cardiac shadow is prominent but stable. Postsurgical changes are again noted. The overall inspiratory effort is poor. Mild increased vascular congestion noted without significant edema. Bibasilar atelectatic changes are noted. No bony abnormality is noted. IMPRESSION: Mild bibasilar atelectasis. Mild vascular congestion. Electronically Signed   By: Inez Catalina M.D.   On: 09/14/2018 21:59     CBC Recent Labs  Lab 09/19/18 0445  09/20/18 0529 09/21/18 0347 09/22/18 1313 09/23/18 0534  WBC 15.6* 20.0* 21.3* 16.3* 14.2*  HGB 9.1* 9.3* 9.3* 7.5* 7.6*  HCT 29.6* 30.2* 31.0* 24.5* 25.7*  PLT 307 312 365  292 288  MCV 90.8 90.7 91.7 90.4 92.4  MCH 27.9 27.9 27.5 27.7 27.3  MCHC 30.7 30.8 30.0 30.6 29.6*  RDW 17.1* 17.0* 16.9* 16.9* 16.6*  LYMPHSABS  --   --   --  1.2  --   MONOABS  --   --   --  1.0  --   EOSABS  --   --   --  0.1  --   BASOSABS  --   --   --  0.0  --     Chemistries  Recent Labs  Lab 09/18/18 0549 09/19/18 0445 09/20/18 0529 09/21/18 0347 09/22/18 1313 09/23/18 0534  NA 140 143 139 141 139 142  K 3.6 3.9 4.1 4.6 4.7 4.2  CL 96* 101 100 102 104 106  CO2 31 32 27 27 25 24   GLUCOSE 150* 156* 107* 111* 130* 62*  BUN 72* 65* 63* 64* 64* 63*  CREATININE 4.49* 4.16* 4.19* 4.24* 4.68* 4.86*  CALCIUM 8.0* 8.1* 8.0* 8.2* 7.9* 8.1*  MG 2.1  --  2.1  --   --  2.3  AST  --   --   --   --  31  --   ALT  --   --   --   --  11  --   ALKPHOS  --   --   --   --  50  --   BILITOT  --   --   --   --  1.0  --    ------------------------------------------------------------------------------------------------------------------ estimated creatinine clearance is 20.8 mL/min (A) (by C-G formula based on SCr of 4.86 mg/dL (H)). ------------------------------------------------------------------------------------------------------------------ No results for input(s): HGBA1C in the last 72 hours. ------------------------------------------------------------------------------------------------------------------ No results for input(s): CHOL, HDL, LDLCALC, TRIG, CHOLHDL, LDLDIRECT in the last 72 hours. ------------------------------------------------------------------------------------------------------------------ No results for input(s): TSH, T4TOTAL, T3FREE, THYROIDAB in the last 72 hours.  Invalid input(s):  FREET3 ------------------------------------------------------------------------------------------------------------------ No results for input(s): VITAMINB12, FOLATE, FERRITIN, TIBC, IRON, RETICCTPCT in the last 72 hours.  Coagulation profile Recent Labs  Lab 09/22/18 1742 09/23/18 0534  INR 1.3* 1.2    No results for input(s): DDIMER in the last 72 hours.  Cardiac Enzymes No results for input(s): CKMB, TROPONINI, MYOGLOBIN in the last 168 hours.  Invalid input(s): CK ------------------------------------------------------------------------------------------------------------------ Invalid input(s): POCBNP    Assessment & Plan    64 year old male with multiple medical problems of recent acute appendicitis status post surgery, diabetes mellitus type 2, hypertension, chronic diastolic heart failure comes today with altered mental status, fever, just discharged 2 days ago now admitted with s appendicular abscess.   #1 appendicular abscess with evidence of fever, IV antibiotics per surgery further management per surgery #2 acute on chronic anemia, will be transfused blood continue monitor hemoglobin #3 Acute renal failure on chronic kidney disease stage III, followed by nephrology on recent admission, continue IV fluids monitor renal function  #4.  Diabetes mellitus type 2, not well controlled, c with hypoglycemia discontinue pre-meal insulin continue sliding scale insulin #5  History of chronic systolic heart failure, CAD, continue beta-blockers with Coreg, avoid diuretics due to renal failure and also aspirin because of possible hematoma, abdomen anemia.. #6 metabolic encephalopathy: Due to #1 continue supportive care  #7 prognosis poor due to multiple medical problems,    Code Status Orders  (From admission, onward)         Start     Ordered   09/22/18 1831  Full code  Continuous     09/22/18 1831  Code Status History    Date Active Date Inactive Code Status  Order ID Comments User Context   09/08/2018 1838 09/21/2018 2107 Full Code [id number]  Jules Husbands, MD Inpatient   03/13/2018 1152 03/13/2018 1730 Full Code [id number]  Samara Deist, [facility] Inpatient   03/02/2018 1033 03/02/2018 1539 Full Code [id number]  Algernon Huxley, MD Inpatient             DVT Prophylaxis SCDs Lab Results  Component Value Date   PLT 288 09/23/2018     Time Spent in minutes   35 minutes  Greater than 50% of time spent in care coordination and counseling patient regarding the condition and plan of care.   Dustin Flock M.D on 09/23/2018 at 10:34 AM  Between 7am to 6pm - Pager - [phone]  After 6pm go to [email or web address] - Proofreader  [employer]   Office  [phone]

## 2018-09-23 NOTE — Progress Notes (Signed)
Dr. Dahlia Byes notified about fever and still awaiting PRBC release. Tylenol Supp. Ordered and given

## 2018-09-23 NOTE — Progress Notes (Signed)
MD was notified of pt. unable to void for the shift. and presents  Tachypnea. Bladder scan was showing 620 mL's order received.

## 2018-09-23 NOTE — Sedation Documentation (Signed)
20 ml lidocaine given by Dr Golden Circle

## 2018-09-23 NOTE — Progress Notes (Signed)
This am, pt.  had two episodes of hypoglycemia. MD aware and new order received.  14:30 Pt. JP output was bloody/clotty  like pudding consistency. MD aware and order received.  No signs of acute distress.

## 2018-09-23 NOTE — Sedation Documentation (Signed)
No pre medication ordered at this time per Dr Golden Circle

## 2018-09-23 NOTE — Progress Notes (Signed)
Pt. NPO for procedure in am

## 2018-09-23 NOTE — Consult Note (Signed)
Pharmacy Antibiotic Note  Phillip [name] is a 63 y.o. male admitted on 09/22/2018 with an intra-abdominal abscess.  Pharmacy has been consulted for Zosyn dosing. He is s/p lap appy for gangrenous appendicitis 4/7. His SCr is notably elevated above his baseline of 2.23 mg/dL and is trending higher  Plan: Zosyn 3.375g IV q12h (4 hour infusion).  Height: 6\' 3"  (190.5 cm) Weight: 240 lb (108.9 kg) IBW/kg (Calculated) : 84.5  Temp (24hrs), Avg:100.4 F (38 C), Min:98.8 F (37.1 C), Max:102.8 F (39.3 C)  Recent Labs  Lab 09/19/18 0445 09/20/18 0529 09/21/18 0347 09/22/18 1313 09/22/18 1314 09/23/18 0534  WBC 15.6* 20.0* 21.3* 16.3*  --  14.2*  CREATININE 4.16* 4.19* 4.24* 4.68*  --  4.86*  LATICACIDVEN  --   --   --   --  1.2  --     Estimated Creatinine Clearance: 20.8 mL/min (A) (by C-G formula based on SCr of 4.86 mg/dL (H)).    No Known Allergies  Antimicrobials this admission: Zosyn 4/21 >>   Microbiology results: 4/21 BCx: NGTD 4/21 UCx: pending  4/15 C diff: negative  4/13 BCx: NGF  Thank you for allowing pharmacy to be a part of this patient's care.  Dallie Piles, PharmD 09/23/2018 9:00 AM

## 2018-09-24 LAB — BASIC METABOLIC PANEL
Anion gap: 10 (ref 5–15)
BUN: 62 mg/dL — ABNORMAL HIGH (ref 8–23)
CO2: 24 mmol/L (ref 22–32)
Calcium: 7.9 mg/dL — ABNORMAL LOW (ref 8.9–10.3)
Chloride: 108 mmol/L (ref 98–111)
Creatinine, Ser: 4.87 mg/dL — ABNORMAL HIGH (ref 0.61–1.24)
GFR calc Af Amer: 14 mL/min — ABNORMAL LOW (ref >60)
GFR calc non Af Amer: 12 mL/min — ABNORMAL LOW (ref >60)
Glucose, Bld: 91 mg/dL (ref 70–99)
Potassium: 4.3 mmol/L (ref 3.5–5.1)
Sodium: 142 mmol/L (ref 135–145)

## 2018-09-24 LAB — CBC
HCT: 27 % — ABNORMAL LOW (ref 39.0–52.0)
Hemoglobin: 8.1 g/dL — ABNORMAL LOW (ref 13.0–17.0)
MCH: 27.6 pg (ref 26.0–34.0)
MCHC: 30 g/dL (ref 30.0–36.0)
MCV: 91.8 fL (ref 80.0–100.0)
Platelets: 249 10*3/uL (ref 150–400)
RBC: 2.94 MIL/uL — ABNORMAL LOW (ref 4.22–5.81)
RDW: 16.5 % — ABNORMAL HIGH (ref 11.5–15.5)
WBC: 11.5 10*3/uL — ABNORMAL HIGH (ref 4.0–10.5)
nRBC: 0 % (ref 0.0–0.2)

## 2018-09-24 LAB — GLUCOSE, CAPILLARY
Glucose-Capillary: 105 mg/dL — ABNORMAL HIGH (ref 70–99)
Glucose-Capillary: 117 mg/dL — ABNORMAL HIGH (ref 70–99)
Glucose-Capillary: 121 mg/dL — ABNORMAL HIGH (ref 70–99)
Glucose-Capillary: 124 mg/dL — ABNORMAL HIGH (ref 70–99)
Glucose-Capillary: 67 mg/dL — ABNORMAL LOW (ref 70–99)
Glucose-Capillary: 69 mg/dL — ABNORMAL LOW (ref 70–99)
Glucose-Capillary: 77 mg/dL (ref 70–99)
Glucose-Capillary: 96 mg/dL (ref 70–99)

## 2018-09-24 LAB — C DIFFICILE QUICK SCREEN W PCR REFLEX
C Diff antigen: NEGATIVE
C Diff interpretation: NOT DETECTED
C Diff toxin: NEGATIVE

## 2018-09-24 NOTE — Progress Notes (Signed)
D/w his wife for over 30 minutes about his hospitalization. He was very frustrated because her husband had to be re-admitted. I explained to her that he had clinical improvement and that at the time of discharge it was deamed appropriate by the on call team of our doctors to send him home. From what is on the chart he was ambulating, taking PO and Pain free. He did have an increase in WBC but had a previous and recent scan did not find a drainable collection.  HE was readmitted Tuesday and CT scan did show a collection that was appropriately drained yesterday. I gave her an update about his condition.

## 2018-09-24 NOTE — Progress Notes (Signed)
[facility] Day(s): 2.   Post op day(s):  Phillip [name]   Interval History: Patient seen and examined, he did have issues with urinary retention again requiring in and out catheterization (~700 mLs). Renal function remains compromised, sCr 4.8. Leukocytosis is improved this morning (11k) s/p RLQ drain placement which showed infected post-operative hematoma. 360 ccs out since placement. Hgb improved s/p transfusion 1 unit pRBCs yesterday.   This morning, he notes he is feeling good and is very inquisitive into what is going on this morning. He denied any new fever, chills, nausea, or emesis. Some abdominal soreness but improving. He is tolerating a diet but notes the food is "not very good." He does believe he has been able to urinate on his own since yesterday evening.   Review of Systems:  Constitutional: denies fever, chills  Respiratory: denies any shortness of breath  Cardiovascular: denies chest pain or palpitations  Gastrointestinal: denies abdominal pain, N/V, or diarrhea/and bowel function as per interval history Genitourinary: + Urinary Retention Integumentary: + Laparotomy incisions   Vital signs in last 24 hours: [min-max] current  Temp:  [98 F (36.7 C)-99.8 F (37.7 C)] 98 F (36.7 C) (04/23 0403) Pulse Rate:  [81-101] 81 (04/23 0403) Resp:  [18-34] 20 (04/23 0403) BP: (125-148)/(66-86) 139/66 (04/23 0403) SpO2:  [94 %-99 %] 97 % (04/23 0403)     Height: 6\' 3"  (190.5 cm) Weight: 108.9 kg BMI (Calculated): 30   Intake/Output this shift:  No intake/output data recorded.    Physical Exam:  Constitutional: alert, cooperative and no distress  Respiratory: breathing non-labored at rest  Cardiovascular: regular rate and sinus rhythm  Gastrointestinal: soft, non-tender, and non-distended. JP in RLQ with old thick blood in bulb, likely consistent with infected hematoma  Integumentary: Laparoscopic incisions are CDI, there is surrounding  ecchymosis, no erythema or drainage  Labs:  CBC Latest Ref Rng & Units 09/24/2018 09/23/2018 09/23/2018  WBC 4.0 - 10.5 K/uL 11.5(H) - 14.2(H)  Hemoglobin 13.0 - 17.0 g/dL 8.1(L) 8.8(L) 7.6(L)  Hematocrit 39.0 - 52.0 % 27.0(L) 28.7(L) 25.7(L)  Platelets 150 - 400 K/uL 249 - 288   CMP Latest Ref Rng & Units 09/24/2018 09/23/2018 09/22/2018  Glucose 70 - 99 mg/dL 91 62(L) 130(H)  BUN 8 - 23 mg/dL 62(H) 63(H) 64(H)  Creatinine 0.61 - 1.24 mg/dL 4.87(H) 4.86(H) 4.68(H)  Sodium 135 - 145 mmol/L 142 142 139  Potassium 3.5 - 5.1 mmol/L 4.3 4.2 4.7  Chloride 98 - 111 mmol/L 108 106 104  CO2 22 - 32 mmol/L 24 24 25   Calcium 8.9 - 10.3 mg/dL 7.9(L) 8.1(L) 7.9(L)  Total Protein 6.5 - 8.1 g/dL - - 6.8  Total Bilirubin 0.3 - 1.2 mg/dL - - 1.0  Alkaline Phos 38 - 126 U/L - - 50  AST 15 - 41 U/L - - 31  ALT 0 - 44 U/L - - 11     Imaging studies:   CXR (09/24/2018) personally reviewed and radiologist report reviewed:  IMPRESSION: 1) Cardiomegaly with vascular congestion and probable mild interstitial edema. 2) Low volumes with bibasilar atelectasis.   Assessment/Plan: (ICD-10's: K35.80) 63 y.o. male with improved leukocytosis and abdominal pain still with elevated sCr but questionably resolved urinary retention s/p percutaneous drain placed yesterday by IR for infected RLQ hematoma 16 days s/p laparoscopic appendectomy for acute appendicitis for which his post-operative course has been complicated by acute on chronic renal failure and failure to thrive   - On renal diet;  continue as toelrates  - IVF stopped 2/2 urinary retention/vasculat congestion on CXR  - Continue IV ABx (Zosyn)   - Monitor abdominal examination; on-going bowel function  - Monitor JP drain output   - No indication for emergent surgical intervention at this time             - Monitor leukocytosis; morning CBC             - Monitor Hgb; transfuse as needed             - Monitor renal function; U/O; nephrology following              - Medical management of comorbidities; appreciate hospitalist help              - Hold anticoagulation   All of the above findings and recommendations were discussed with the patient, and the medical team, and all of patient's questions were answered to his expressed satisfaction.  -- Edison Simon, PA-C [facility] 09/24/2018, 7:49 AM [phone] M-F: 7am - 4pm

## 2018-09-24 NOTE — Evaluation (Signed)
Physical Therapy Evaluation Patient Details Name: Phillip [name] MRN: [id number] DOB: 1955-10-10 Today's Date: 09/24/2018   History of Present Illness  Pt is a 63 y.o. male presenting to hospital 09/22/18 with fever, AMS, and syncopal episode (had just discharged from hospital 09/21/18--appendicitis).  Pt admitted with appendicular abscess (s/p R LQ drain placement d/t infected post-op hematoma), metabolic encephalopathy, acute renal failure on CKD stage III, and acute on chronic anemia (s/p blood transfusion).  PMH includes L toe amp 03/13/18, CHF, CAD, DM, htn, PVD.  Clinical Impression  Prior to this hospital admission, pt was ambulating with RW.  Pt lives with his wife in 1 level home with 3 steps (B railings) to enter.  Initially pt min assist to stand from bed but progressed to CGA standing from [facility] and recliner with RW.  Able to ambulate 40 feet in room with RW (overall steady with RW; limited distance per pt request d/t reports of fatigue but anticipate pt would be able to walk further).  Pt would benefit from skilled PT to address noted impairments and functional limitations (see below for any additional details).  Upon hospital discharge, recommend pt discharge with HHPT and initial SBA for functional mobility.    Follow Up Recommendations Home health PT;Supervision for mobility/OOB    Equipment Recommendations  Rolling walker with 5" wheels    Recommendations for Other Services OT consult     Precautions / Restrictions Precautions Precautions: Fall Precaution Comments: Abdominal drain Restrictions Weight Bearing Restrictions: No      Mobility  Bed Mobility Overal bed mobility: Needs Assistance Bed Mobility: Supine to Sit     Supine to sit: Supervision     General bed mobility comments: mild increased effort to perform on own  Transfers Overall transfer level: Needs assistance Equipment used: Rolling walker (2 wheeled) Transfers: Sit to/from [employer] Sit to Stand: Min assist Stand pivot transfers: Min guard       General transfer comment: min assist to stand from bed; CGA to stand from [facility] and recliner; initial vc's for UE/LE placement  Ambulation/Gait Ambulation/Gait assistance: Min guard Gait Distance (Feet): 40 Feet Assistive device: Rolling walker (2 wheeled)   Gait velocity: decreased   General Gait Details: step through; steady with RW; mild narrow BOS  Stairs            Wheelchair Mobility    Modified Rankin (Stroke Patients Only)       Balance Overall balance assessment: Needs assistance Sitting-balance support: No upper extremity supported;Feet supported Sitting balance-Leahy Scale: Normal Sitting balance - Comments: steady sitting reaching outside BOS   Standing balance support: Single extremity supported Standing balance-Leahy Scale: Poor Standing balance comment: pt requiring at least single UE support for static standing balance                             Pertinent Vitals/Pain Pain Assessment: No/denies pain  Vitals (HR and O2 on room air) stable and WFL throughout treatment session.    Home Living Family/patient expects to be discharged to:: Private residence Living Arrangements: Spouse/significant other Available Help at Discharge: Family Type of Home: Mobile home(Double wide) Home Access: Stairs to enter Entrance Stairs-Rails: Can reach both Entrance Stairs-Number of Steps: 3 Home Layout: One level Home Equipment: [place] - 2 wheels;Shower seat      Prior Function Level of Independence: Independent         Comments: Ambulating with RW upon last hospital  discharge     Hand Dominance        Extremity/Trunk Assessment   Upper Extremity Assessment Upper Extremity Assessment: Generalized weakness    Lower Extremity Assessment Lower Extremity Assessment: Generalized weakness    Cervical / Trunk Assessment Cervical / Trunk Assessment: Normal   Communication   Communication: No difficulties  Cognition Arousal/Alertness: Awake/alert Behavior During Therapy: WFL for tasks assessed/performed Overall Cognitive Status: Within Functional Limits for tasks assessed                                        General Comments General comments (skin integrity, edema, etc.): abdominal drain in place/intact beginning/end of session.  Nursing cleared pt for participation in physical therapy.  Pt agreeable to PT session.    Exercises  Transfer and gait training   Assessment/Plan    PT Assessment Patient needs continued PT services  PT Problem List Decreased strength;Decreased activity tolerance;Decreased balance;Decreased mobility;Decreased knowledge of use of DME;Pain;Decreased skin integrity       PT Treatment Interventions DME instruction;Gait training;Stair training;Functional mobility training;Therapeutic activities;Therapeutic exercise;Balance training;Patient/family education    PT Goals (Current goals can be found in the Care Plan section)  Acute Rehab PT Goals Patient Stated Goal: improve strength and walking PT Goal Formulation: With patient Time For Goal Achievement: 10/08/18 Potential to Achieve Goals: Good    Frequency Min 2X/week   Barriers to discharge        Co-evaluation               AM-PAC PT "6 Clicks" Mobility  Outcome Measure Help needed turning from your back to your side while in a flat bed without using bedrails?: A Little Help needed moving from lying on your back to sitting on the side of a flat bed without using bedrails?: A Little Help needed moving to and from a bed to a chair (including a wheelchair)?: A Little Help needed standing up from a chair using your arms (e.g., wheelchair or bedside chair)?: A Little Help needed to walk in hospital room?: A Little Help needed climbing 3-5 steps with a railing? : A Little 6 Click Score: 18    End of Session Equipment Utilized  During Treatment: Gait belt(gait belt up high away from abdominal drain) Activity Tolerance: Patient tolerated treatment well Patient left: in chair;with call bell/phone within reach;with chair alarm set;with SCD's reapplied Nurse Communication: Mobility status;Precautions PT Visit Diagnosis: Other abnormalities of gait and mobility (R26.89);Muscle weakness (generalized) (M62.81);Difficulty in walking, not elsewhere classified (R26.2)    Time: 1340-1426 PT Time Calculation (min) (ACUTE ONLY): 46 min   Charges:   PT Evaluation $PT Eval Low Complexity: 1 Low PT Treatments $Therapeutic Activity: 23-37 mins       Leitha Bleak, PT 09/24/18, 4:23 PM [phone]

## 2018-09-24 NOTE — Progress Notes (Signed)
CBG up to 70, asymptomatic. Taking in coke and protein

## 2018-09-24 NOTE — Progress Notes (Signed)
[place] [facility]                                                                                                                                                                                  Patient Demographics   Phillip [name], is a 63 y.o. male, DOB - 1955/08/18, CXK:[id number]  Admit date - 09/22/2018   Admitting Physician Jules Husbands, MD  Outpatient Primary MD for the patient is Raina Mina., MD   LOS - 2  Subjective: Patient had hypoglycemia earlier this morning otherwise denies any complaints   Review of Systems:   CONSTITUTIONAL: No documented fever. No fatigue, weakness. No weight gain, no weight loss.  EYES: No blurry or double vision.  ENT: No tinnitus. No postnasal drip. No redness of the oropharynx.  RESPIRATORY: No cough, no wheeze, no hemoptysis. No dyspnea.  CARDIOVASCULAR: No chest pain. No orthopnea. No palpitations. No syncope.  GASTROINTESTINAL: No nausea, no vomiting or diarrhea. No abdominal pain. No melena or hematochezia.  GENITOURINARY: No dysuria or hematuria.  ENDOCRINE: No polyuria or nocturia. No heat or cold intolerance.  HEMATOLOGY: No anemia. No bruising. No bleeding.  INTEGUMENTARY: No rashes. No lesions.  MUSCULOSKELETAL: No arthritis. No swelling. No gout.  NEUROLOGIC: No numbness, tingling, or ataxia. No seizure-type activity.  PSYCHIATRIC: No anxiety. No insomnia. No ADD.    Vitals:   Vitals:   09/23/18 1344 09/23/18 2035 09/24/18 0403 09/24/18 0853  BP: (!) 148/82 131/71 139/66 (!) 148/78  Pulse: (!) 101 89 81 87  Resp: (!) 22 (!) 24 20   Temp: 99.6 F (37.6 C) 99.4 F (37.4 C) 98 F (36.7 C)   TempSrc: Oral Oral Oral   SpO2: 95% 95% 97%   Weight:      Height:        Wt Readings from Last 3 Encounters:  09/22/18 108.9 kg  09/08/18 110.2 kg  04/24/18 98.6 kg     Intake/Output Summary (Last 24 hours) at 09/24/2018 0924 Last data filed at 09/24/2018 0603 Gross per 24 hour  Intake  2045.5 ml  Output 1361 ml  Net 684.5 ml    Physical Exam:   GENERAL: Pleasant-appearing in no apparent distress.  HEAD, EYES, EARS, NOSE AND THROAT: Atraumatic, normocephalic. Extraocular muscles are intact. Pupils equal and reactive to light. Sclerae anicteric. No conjunctival injection. No oro-pharyngeal erythema.  NECK: Supple. There is no jugular venous distention. No bruits, no lymphadenopathy, no thyromegaly.  HEART: Regular rate and rhythm,. No murmurs, no rubs, no clicks.  LUNGS: Clear to auscultation bilaterally. No rales or rhonchi. No wheezes.  ABDOMEN: Soft, flat, nontender, nondistended. Has good bowel sounds. No hepatosplenomegaly  appreciated.  EXTREMITIES: No evidence of any cyanosis, clubbing, or peripheral edema.  +2 pedal and radial pulses bilaterally.  NEUROLOGIC: The patient is alert, awake, and oriented x3 with no focal motor or sensory deficits appreciated bilaterally.  SKIN: Moist and warm with no rashes appreciated.  Psych: Not anxious, depressed LN: No inguinal LN enlargement    Antibiotics   Anti-infectives (From admission, onward)   Start     Dose/Rate Route Frequency Ordered Stop   09/23/18 2200  piperacillin-tazobactam (ZOSYN) IVPB 3.375 g     3.375 g 12.5 mL/hr over 240 Minutes Intravenous Every 12 hours 09/23/18 0859     09/23/18 0500  piperacillin-tazobactam (ZOSYN) IVPB 3.375 g  Status:  Discontinued     3.375 g 12.5 mL/hr over 240 Minutes Intravenous Every 8 hours 09/23/18 0220 09/23/18 0859   09/22/18 1845  piperacillin-tazobactam (ZOSYN) IVPB 3.375 g  Status:  Discontinued     3.375 g 12.5 mL/hr over 240 Minutes Intravenous Every 8 hours 09/22/18 1831 09/23/18 0220   09/22/18 1430  piperacillin-tazobactam (ZOSYN) IVPB 3.375 g  Status:  Discontinued     3.375 g 100 mL/hr over 30 Minutes Intravenous  Once 09/22/18 1428 09/22/18 1429   09/22/18 1430  cefTRIAXone (ROCEPHIN) 1 g in sodium chloride 0.9 % 100 mL IVPB     1 g 200 mL/hr over 30 Minutes  Intravenous  Once 09/22/18 1429 09/22/18 1508   09/22/18 1430  metroNIDAZOLE (FLAGYL) IVPB 500 mg     500 mg 100 mL/hr over 60 Minutes Intravenous  Once 09/22/18 1429 09/22/18 1610      Medications   Scheduled Meds: . acetaminophen  1,000 mg Oral Q6H  . carvedilol  6.25 mg Oral BID WC  . pantoprazole (PROTONIX) IV  40 mg Intravenous QHS  . sodium chloride flush  10 mL Intracatheter Daily   Continuous Infusions: . sodium chloride Stopped (09/23/18 1247)  . piperacillin-tazobactam (ZOSYN)  IV 3.375 g (09/24/18 0857)   PRN Meds:.sodium chloride, acetaminophen, acetaminophen, hydrALAZINE, morphine injection, ondansetron **OR** ondansetron (ZOFRAN) IV, oxyCODONE   Data Review:   Micro Results Recent Results (from the past 240 hour(s))  Culture, blood (routine x 2)     Status: None   Collection Time: 09/14/18  9:02 PM  Result Value Ref Range Status   Specimen Description BLOOD BLOOD RIGHT HAND  Final   Special Requests   Final    BOTTLES DRAWN AEROBIC AND ANAEROBIC Blood Culture adequate volume   Culture   Final    NO GROWTH 5 DAYS Performed at [facility], [address] [place], [place] [address]    Report Status 09/19/2018 FINAL  Final  Culture, blood (routine x 2)     Status: None   Collection Time: 09/14/18  9:09 PM  Result Value Ref Range Status   Specimen Description BLOOD RIGHT ANTECUBITAL  Final   Special Requests   Final    BOTTLES DRAWN AEROBIC AND ANAEROBIC Blood Culture adequate volume   Culture   Final    NO GROWTH 5 DAYS Performed at [facility], [address] [place], [place] [address]    Report Status 09/19/2018 FINAL  Final  C difficile quick scan w PCR reflex     Status: None   Collection Time: 09/16/18  6:39 PM  Result Value Ref Range Status   C Diff antigen NEGATIVE NEGATIVE Final   C Diff toxin NEGATIVE NEGATIVE Final   C Diff interpretation No C. difficile detected.  Final  Comment: Performed at [facility],  [address] [place], [place] [address]  Blood Culture (routine x 2)     Status: None (Preliminary result)   Collection Time: 09/22/18  1:14 PM  Result Value Ref Range Status   Specimen Description BLOOD BLOOD LEFT WRIST  Final   Special Requests   Final    BOTTLES DRAWN AEROBIC AND ANAEROBIC Blood Culture adequate volume   Culture   Final    NO GROWTH 2 DAYS Performed at [facility], [address] [place], [place] [address]    Report Status PENDING  Incomplete  Blood Culture (routine x 2)     Status: None (Preliminary result)   Collection Time: 09/22/18  1:38 PM  Result Value Ref Range Status   Specimen Description BLOOD RIGHT HAND  Final   Special Requests   Final    BOTTLES DRAWN AEROBIC AND ANAEROBIC Blood Culture results may not be optimal due to an inadequate volume of blood received in culture bottles   Culture   Final    NO GROWTH 2 DAYS Performed at [facility], [address] [place], [place] [address]    Report Status PENDING  Incomplete  Urine culture     Status: None   Collection Time: 09/22/18  5:42 PM  Result Value Ref Range Status   Specimen Description   Final    URINE, RANDOM Performed at [facility], [address] [place], [place] [address]    Special Requests   Final    NONE Performed at [facility], [address], [place],  [address]    Culture   Final    NO GROWTH Performed at [facility], [place] [address], [place],  [address]    Report Status 09/23/2018 FINAL  Final    Radiology Reports Ct Abdomen Pelvis Wo Contrast  Result Date: 09/22/2018 CLINICAL DATA:  Fever status post appendectomy last week. EXAM: CT ABDOMEN AND PELVIS WITHOUT CONTRAST TECHNIQUE: Multidetector CT imaging of the abdomen and pelvis was performed following the standard protocol without IV contrast. COMPARISON:  CT scan of September 15, 2018. FINDINGS: Lower chest: No acute abnormality. Hepatobiliary:  Cholelithiasis is noted. No biliary dilatation is noted. Stable small amount of free fluid is noted around the liver. No focal hepatic abnormality is noted on these unenhanced images. Pancreas: Unremarkable. No pancreatic ductal dilatation or surrounding inflammatory changes. Spleen: There is interval development of mild amount of fluid around the spleen which demonstrates Hounsfield measurement of 22 concerning for possible hemorrhage. Adrenals/Urinary Tract: Adrenal glands are unremarkable. Kidneys are normal, without renal calculi, focal lesion, or hydronephrosis. Bladder is unremarkable. Stomach/Bowel: The stomach appears normal. Status post appendectomy. There is no evidence of bowel obstruction. There is noted 10.7 x 6.9 cm abnormality in the right lower quadrant concerning for abscess or hematoma. No definite bowel dilatation is noted. Vascular/Lymphatic: Aortic atherosclerosis. No enlarged abdominal or pelvic lymph nodes. Reproductive: Prostate is unremarkable. Other: No abnormal hernia is noted. There is also noted stable hyperdense fluid in the pelvis. Musculoskeletal: Stable grade 1 anterolisthesis of L5-S1 is noted secondary to bilateral L5 pars defects. IMPRESSION: Interval development of 10.7 x 6.9 cm probable abscess or less likely hematoma seen in the right lower quadrant of the abdomen. Stable amount of probable intraperitoneal hemorrhage is noted in the pelvis and around the liver, with new fluid noted around the spleen. Critical Value/emergent results were called by telephone at the time of interpretation on 09/22/2018  at 2:20 pm to Dr. Merlyn Lot , who verbally acknowledged these results. Cholelithiasis is noted. Aortic Atherosclerosis (ICD10-I70.0). Electronically Signed   By: Marijo Conception M.D.   On: 09/22/2018 14:21   Ct Abdomen Pelvis Wo Contrast  Result Date: 09/15/2018 CLINICAL DATA:  Fever.  Status post appendectomy. EXAM: CT ABDOMEN AND PELVIS WITHOUT CONTRAST TECHNIQUE:  Multidetector CT imaging of the abdomen and pelvis was performed following the standard protocol without IV contrast. COMPARISON:  09/11/2018 FINDINGS: Lower chest: Bibasilar atelectasis.  Small right pleural effusion. Hepatobiliary: Multiple gallstones.  Unremarkable liver. Pancreas: Unremarkable Spleen: Unremarkable Adrenals/Urinary Tract: Kidneys and adrenal glands are within normal limits. Foley catheter is positioned in the bladder. Stomach/Bowel: Stomach is distended with contrast. Dilated proximal small bowel loops are stable. Distal small bowel loops are decompressed. Overall small bowel distension has slightly improved. Colon is also decompressed. Vascular/Lymphatic: Mild aortic and iliac artery vascular calcifications. No abnormal retroperitoneal adenopathy. Reproductive: Prostate is small. Other: Hyperdense free fluid within the lower abdomen and pelvis is not significantly changed. Small amount of hyperdense fluid about the liver is stable. Musculoskeletal: Bilateral L5 pars defects and grade 1 spondylolisthesis. No vertebral compression deformity. IMPRESSION: Dilated proximal small bowel loops have slightly improved. Distal small bowel loops in colon remain decompressed. This suggest an improving small bowel obstruction pattern. Stable hyperdense fluid within the lower abdomen and pelvis most consistent with intraperitoneal hemorrhage. Electronically Signed   By: Marybelle Killings M.D.   On: 09/15/2018 07:54   Ct Abdomen Pelvis Wo Contrast  Result Date: 09/11/2018 CLINICAL DATA:  Postop appendectomy.  Abdominal distention. EXAM: CT ABDOMEN AND PELVIS WITHOUT CONTRAST TECHNIQUE: Multidetector CT imaging of the abdomen and pelvis was performed following the standard protocol without IV contrast. COMPARISON:  09/08/2018 FINDINGS: Lower chest: Cardiomegaly. Bibasilar atelectasis. Coronary artery calcifications, status post CABG. Hepatobiliary: Layering gallstones within the bladder. No focal hepatic  abnormality. Pancreas: No focal abnormality or ductal dilatation. Spleen: No focal abnormality.  Normal size. Adrenals/Urinary Tract: Foley catheter present in the bladder which is decompressed. No hydronephrosis. Adrenal glands unremarkable. Stomach/Bowel: Dilated small bowel loops. Distal small bowel loops and colon are decompressed. Mild distention of the stomach with contrast. Vascular/Lymphatic: Aortic atherosclerosis. No enlarged abdominal or pelvic lymph nodes. Reproductive: Mild prostate prominence. Other: Moderate complex free fluid within the abdomen and pelvis, possibly hemoperitoneum. No free air. Musculoskeletal: No acute bony abnormality. Pars defects and degenerative changes at L5 S1. IMPRESSION: Postop from appendectomy. There are mildly dilated small bowel loops. Distal small bowel loops are decompressed as is the colon. This could reflect ileus or small-bowel obstruction. Moderate complex fluid in the abdomen and pelvis, most notable in the pelvis. This may reflect blood/hemoperitoneum. Aortic atherosclerosis. Bibasilar atelectasis. Prostate enlargement. Electronically Signed   By: Rolm Baptise M.D.   On: 09/11/2018 10:18   Dg Chest 1 View  Result Date: 09/23/2018 CLINICAL DATA:  Shortness of breath EXAM: CHEST  1 VIEW COMPARISON:  09/22/2018 FINDINGS: Prior CABG. Cardiomegaly with vascular congestion and mild pulmonary edema. Low volumes with bibasilar atelectasis. No visible effusions or acute bony abnormality. IMPRESSION: Cardiomegaly with vascular congestion and probable mild interstitial edema. Low volumes with bibasilar atelectasis. Electronically Signed   By: Rolm Baptise M.D.   On: 09/23/2018 20:08   Dg Chest 1 View  Result Date: 09/15/2018 CLINICAL DATA:  Hypoxia EXAM: CHEST  1 VIEW COMPARISON:  09/14/2018 FINDINGS: Normal heart size. Postoperative changes from CABG. Low volumes and bibasilar atelectasis. No pneumothorax. No pleural effusion. IMPRESSION: Bibasilar  atelectasis and  low lung volumes. Electronically Signed   By: Marybelle Killings M.D.   On: 09/15/2018 09:02   Dg Abd 1 View  Result Date: 09/10/2018 CLINICAL DATA:  Abdominal distention EXAM: ABDOMEN - 1 VIEW COMPARISON:  09/08/2018 abdominal CT FINDINGS: Gas dilated small bowel with less colonic gas. No concerning mass effect or gas collection. Lung bases are clear. IMPRESSION: Dilated small bowel. Recent surgery suggests ileus, but there is less colonic gas and a degree of mechanical obstruction is also possible. Electronically Signed   By: Monte Fantasia M.D.   On: 09/10/2018 07:25   Ct Abdomen Pelvis W Contrast  Result Date: 09/08/2018 CLINICAL DATA:  63 year old male with a history of right lower quadrant pain EXAM: CT ABDOMEN AND PELVIS WITH CONTRAST TECHNIQUE: Multidetector CT imaging of the abdomen and pelvis was performed using the standard protocol following bolus administration of intravenous contrast. CONTRAST:  17mL OMNIPAQUE IOHEXOL 300 MG/ML  SOLN COMPARISON:  None. FINDINGS: Lower chest: No acute finding of the lower chest. 6 mm nodule at the periphery of the left lower lobe (image 11 of series 4). Hepatobiliary: Unremarkable appearance of liver. Cholelithiasis without associated inflammatory changes. Pancreas: Unremarkable Spleen: Unremarkable Adrenals/Urinary Tract: Unremarkable appearance of the adrenal glands. No evidence of hydronephrosis of the right or left kidney. No nephrolithiasis. Unremarkable course of the bilateral ureters. Unremarkable appearance of the urinary bladder. Stomach/Bowel: Unremarkable stomach. Proximal small bowel unremarkable with no focal wall thickening or transition point. Majority of distal small bowel is unremarkable. There is circumferential wall enhancement at the terminal ileum just above the IC valve. Minimal inflammatory changes in the adjacent fat with trace free fluid. Inflammatory changes surround the appendix with fecalized material/gas within the lumen. No evidence of  perforation. No calcified appendicoliths. Colon is relatively unremarkable with mild stool burden. Colonic diverticula without associated inflammatory changes. Vascular/Lymphatic: Atherosclerotic calcifications of the abdominal aorta and the bilateral iliac arteries. No adenopathy. Reproductive: Unremarkable pelvic structures Other: No abdominal wall hernia. Musculoskeletal: Bilateral L5 pars defect with grade 1 anterolisthesis and associated degenerative disc disease of L5-S1. Vacuum disc phenomenon at this level. No acute displaced fracture. Degenerative changes of the hips. IMPRESSION: Acute appendicitis, uncomplicated. There is a presumed reactive ileitis of the terminal ileum. These results were discussed by telephone at the time of interpretation on 09/08/2018 at 2:21 pm with Dr. Lenise Arena. Cholelithiasis without evidence of acute cholecystitis. Nodule at the left lung base, 6 mm. Non-contrast chest CT at 6-12 months is recommended. If the nodule is stable at time of repeat CT, then future CT at 18-24 months (from today's scan) is considered optional for low-risk patients, but is recommended for high-risk patients. This recommendation follows the consensus statement: Guidelines for Management of Incidental Pulmonary Nodules Detected on CT Images: From the Fleischner Society 2017; Radiology 2017; 284:228-243. Electronically Signed   By: Corrie Mckusick D.O.   On: 09/08/2018 14:21   Dg Chest Portable 1 View  Result Date: 09/22/2018 CLINICAL DATA:  Laparoscopic appendectomy on 09/08/2018. Patient presents with fever, shortness of breath and acute mental status changes that began yesterday. Former smoker. EXAM: PORTABLE CHEST 1 VIEW COMPARISON:  09/15/2018 and earlier. FINDINGS: Sternotomy for CABG. Cardiac silhouette moderately enlarged. Chronic elevation of the RIGHT hemidiaphragm and chronic scar/atelectasis at the RIGHT lung base. Pulmonary venous hypertension and minimal to mild interstitial pulmonary  edema, new since the prior exams. No confluent airspace consolidation. No visible pleural effusions. IMPRESSION: Minimal to mild CHF, with moderate cardiomegaly and minimal to  mild diffuse interstitial pulmonary edema. Electronically Signed   By: Evangeline Dakin M.D.   On: 09/22/2018 13:32   Dg Chest Port 1 View  Result Date: 09/14/2018 CLINICAL DATA:  Fevers EXAM: PORTABLE CHEST 1 VIEW COMPARISON:  03/25/2018 FINDINGS: Cardiac shadow is prominent but stable. Postsurgical changes are again noted. The overall inspiratory effort is poor. Mild increased vascular congestion noted without significant edema. Bibasilar atelectatic changes are noted. No bony abnormality is noted. IMPRESSION: Mild bibasilar atelectasis. Mild vascular congestion. Electronically Signed   By: Inez Catalina M.D.   On: 09/14/2018 21:59   Ct Image Guided Drainage By Percutaneous Catheter  Result Date: 09/23/2018 INDICATION: History of recent appendectomy with postoperative hematoma and enlarging air-fluid collection EXAM: CT GUIDED DRAINAGE OF PERICOLONIC ABSCESS MEDICATIONS: The patient is currently admitted to the hospital and receiving intravenous antibiotics. The antibiotics were administered within an appropriate time frame prior to the initiation of the procedure. ANESTHESIA/SEDATION: None COMPLICATIONS: None immediate. TECHNIQUE: Informed written consent was obtained from the patient's wife after a thorough discussion of the procedural risks, benefits and alternatives. All questions were addressed. Maximal Sterile Barrier Technique was utilized including caps, mask, sterile gowns, sterile gloves, sterile drape, hand hygiene and skin antiseptic. A timeout was performed prior to the initiation of the procedure. PROCEDURE: The operative field was prepped with Chlorhexidine in a sterile fashion, and a sterile drape was applied covering the operative field. A sterile gown and sterile gloves were used for the procedure. Local anesthesia  was provided with 1% Lidocaine. Utilizing 1% lidocaine as a local and deep peritoneal anesthetic and CT fluoroscopic guidance, a 10 French pigtail drainage catheter was placed into the right lower quadrant air-fluid collection utilizing a quick stick technique. The catheter was then coiled within the collection. Aspiration was performed an yielded infected postoperative hematoma. A sample was sent for laboratory evaluation. The catheter was then sutured into place utilizing 0 Prolene and a Hopkins buttress. Catheter was then dressed in the standard sterile manner. The catheter was attached to a suction grenade. The patient tolerated well and returned his room in satisfactory condition. IMPRESSION: Successful CT-guided percutaneous drain placement in a right lower quadrant air-fluid collection which represents an infected postoperative hematoma. Electronically Signed   By: Inez Catalina M.D.   On: 09/23/2018 14:31     CBC Recent Labs  Lab 09/20/18 0529 09/21/18 0347 09/22/18 1313 09/23/18 0534 09/23/18 1450 09/24/18 0546  WBC 20.0* 21.3* 16.3* 14.2*  --  11.5*  HGB 9.3* 9.3* 7.5* 7.6* 8.8* 8.1*  HCT 30.2* 31.0* 24.5* 25.7* 28.7* 27.0*  PLT 312 365 292 288  --  249  MCV 90.7 91.7 90.4 92.4  --  91.8  MCH 27.9 27.5 27.7 27.3  --  27.6  MCHC 30.8 30.0 30.6 29.6*  --  30.0  RDW 17.0* 16.9* 16.9* 16.6*  --  16.5*  LYMPHSABS  --   --  1.2  --   --   --   MONOABS  --   --  1.0  --   --   --   EOSABS  --   --  0.1  --   --   --   BASOSABS  --   --  0.0  --   --   --     Chemistries  Recent Labs  Lab 09/18/18 0549  09/20/18 0529 09/21/18 0347 09/22/18 1313 09/23/18 0534 09/24/18 0546  NA 140   < > 139 141 139 142 142  K 3.6   < >  4.1 4.6 4.7 4.2 4.3  CL 96*   < > 100 102 104 106 108  CO2 31   < > 27 27 25 24 24   GLUCOSE 150*   < > 107* 111* 130* 62* 91  BUN 72*   < > 63* 64* 64* 63* 62*  CREATININE 4.49*   < > 4.19* 4.24* 4.68* 4.86* 4.87*  CALCIUM 8.0*   < > 8.0* 8.2* 7.9* 8.1* 7.9*   MG 2.1  --  2.1  --   --  2.3  --   AST  --   --   --   --  31  --   --   ALT  --   --   --   --  11  --   --   ALKPHOS  --   --   --   --  50  --   --   BILITOT  --   --   --   --  1.0  --   --    < > = values in this interval not displayed.   ------------------------------------------------------------------------------------------------------------------ estimated creatinine clearance is 20.7 mL/min (A) (by C-G formula based on SCr of 4.87 mg/dL (H)). ------------------------------------------------------------------------------------------------------------------ No results for input(s): HGBA1C in the last 72 hours. ------------------------------------------------------------------------------------------------------------------ No results for input(s): CHOL, HDL, LDLCALC, TRIG, CHOLHDL, LDLDIRECT in the last 72 hours. ------------------------------------------------------------------------------------------------------------------ No results for input(s): TSH, T4TOTAL, T3FREE, THYROIDAB in the last 72 hours.  Invalid input(s): FREET3 ------------------------------------------------------------------------------------------------------------------ No results for input(s): VITAMINB12, FOLATE, FERRITIN, TIBC, IRON, RETICCTPCT in the last 72 hours.  Coagulation profile Recent Labs  Lab 09/22/18 1742 09/23/18 0534  INR 1.3* 1.2    No results for input(s): DDIMER in the last 72 hours.  Cardiac Enzymes No results for input(s): CKMB, TROPONINI, MYOGLOBIN in the last 168 hours.  Invalid input(s): CK ------------------------------------------------------------------------------------------------------------------ Invalid input(s): POCBNP    Assessment & Plan    63 year old male with multiple medical problems of recent acute appendicitis status post surgery, diabetes mellitus type 2, hypertension, chronic diastolic heart failure comes today with altered mental status, fever, just  discharged 2 days ago now admitted with s appendicular abscess.   #1 appendicular abscess continue IV antibiotics per surgery further management per surgery #2 acute on chronic anemia, monitor hemoglobin may need further transfusions tomorrow #3 Acute renal failure on chronic kidney disease stage III, nephrology following the patient #4.  Diabetes mellitus type 2, not well controlled, persistent hypoglycemia will discontinue sliding scale #5  History of chronic systolic heart failure, CAD, continue beta-blockers with Coreg, avoid diuretics due to renal failure and also aspirin because of possible hematoma, abdomen anemia.. #6 metabolic encephalopathy: Due to #1 continue supportive care appears stable #7 prognosis poor due to multiple medical problems,    Code Status Orders  (From admission, onward)         Start     Ordered   09/22/18 1831  Full code  Continuous     09/22/18 1831        Code Status History    Date Active Date Inactive Code Status Order ID Comments User Context   09/08/2018 1838 09/21/2018 2107 Full Code [id number]  Jules Husbands, MD Inpatient   03/13/2018 1152 03/13/2018 1730 Full Code [id number]  Samara Deist, [facility] Inpatient   03/02/2018 1033 03/02/2018 1539 Full Code [id number]  Algernon Huxley, MD Inpatient             DVT Prophylaxis SCDs Lab Results  Component  Value Date   PLT 249 09/24/2018     Time Spent in minutes   35 minutes  Greater than 50% of time spent in care coordination and counseling patient regarding the condition and plan of care.   Dustin Flock M.D on 09/24/2018 at 9:24 AM  Between 7am to 6pm - Pager - [phone]  After 6pm go to [email or web address] - Proofreader  [employer]   Office  [phone]

## 2018-09-24 NOTE — Progress Notes (Signed)
Blood sugar 67, clammy. Pt. A/o when awakened. Juice and 1 entire container of Glucerna given po. Will recheck blood sugar

## 2018-09-24 NOTE — Progress Notes (Signed)
[facility], [place] 09/24/18  Subjective:   Patient known to our practice from recent admission Was discharged on April 20.  He returns via EMS from home after having fever.  Family also reported some confusion Patient had CT-guided placement of drain for infected hematoma in the right lower quadrant Draining bloody fluid.  So far 360 cc reported Urine output 800 cc Patient denies any acute complaints although not talking much today.   Objective:  Vital signs in last 24 hours:  Temp:  [98 F (36.7 C)-99.6 F (37.6 C)] 98 F (36.7 C) (04/23 0403) Pulse Rate:  [81-101] 87 (04/23 0853) Resp:  [18-34] 20 (04/23 0403) BP: (131-148)/(66-86) 148/78 (04/23 0853) SpO2:  [94 %-99 %] 97 % (04/23 0403)  Weight change:  Filed Weights   09/22/18 1310  Weight: 108.9 kg    Intake/Output:    Intake/Output Summary (Last 24 hours) at 09/24/2018 1202 Last data filed at 09/24/2018 1145 Gross per 24 hour  Intake 2065.5 ml  Output 1811 ml  Net 254.5 ml     Physical Exam: General:  Sitting up on the side of the bed, no acute distress  HEENT  moist oral mucous membranes  Neck  supple, no masses  Pulm/lungs  normal breathing effort on room air  CVS/Heart  regular rhythm  Abdomen:   Soft  Extremities:  2+ pitting edema bilaterally  Neurologic:  Alert, able to follow commands  Skin:  No acute rashes    Basic Metabolic Panel:  Recent Labs  Lab 09/18/18 0549  09/20/18 0529 09/21/18 0347 09/22/18 1313 09/23/18 0534 09/24/18 0546  NA 140   < > 139 141 139 142 142  K 3.6   < > 4.1 4.6 4.7 4.2 4.3  CL 96*   < > 100 102 104 106 108  CO2 31   < > 27 27 25 24 24   GLUCOSE 150*   < > 107* 111* 130* 62* 91  BUN 72*   < > 63* 64* 64* 63* 62*  CREATININE 4.49*   < > 4.19* 4.24* 4.68* 4.86* 4.87*  CALCIUM 8.0*   < > 8.0* 8.2* 7.9* 8.1* 7.9*  MG 2.1  --  2.1  --   --  2.3  --   PHOS  --   --  4.0  --   --  5.0*  --    < > = values in this interval not  displayed.     CBC: Recent Labs  Lab 09/20/18 0529 09/21/18 0347 09/22/18 1313 09/23/18 0534 09/23/18 1450 09/24/18 0546  WBC 20.0* 21.3* 16.3* 14.2*  --  11.5*  NEUTROABS  --   --  13.9*  --   --   --   HGB 9.3* 9.3* 7.5* 7.6* 8.8* 8.1*  HCT 30.2* 31.0* 24.5* 25.7* 28.7* 27.0*  MCV 90.7 91.7 90.4 92.4  --  91.8  PLT 312 365 292 288  --  249     No results found for: HEPBSAG, HEPBSAB, HEPBIGM    Microbiology:  Recent Results (from the past 240 hour(s))  Culture, blood (routine x 2)     Status: None   Collection Time: 09/14/18  9:02 PM  Result Value Ref Range Status   Specimen Description BLOOD BLOOD RIGHT HAND  Final   Special Requests   Final    BOTTLES DRAWN AEROBIC AND ANAEROBIC Blood Culture adequate volume   Culture   Final    NO GROWTH 5 DAYS Performed at [facility]  [facility], [address] [place], [place] [address]    Report Status 09/19/2018 FINAL  Final  Culture, blood (routine x 2)     Status: None   Collection Time: 09/14/18  9:09 PM  Result Value Ref Range Status   Specimen Description BLOOD RIGHT ANTECUBITAL  Final   Special Requests   Final    BOTTLES DRAWN AEROBIC AND ANAEROBIC Blood Culture adequate volume   Culture   Final    NO GROWTH 5 DAYS Performed at [facility], [address], [place], [place] [address]    Report Status 09/19/2018 FINAL  Final  C difficile quick scan w PCR reflex     Status: None   Collection Time: 09/16/18  6:39 PM  Result Value Ref Range Status   C Diff antigen NEGATIVE NEGATIVE Final   C Diff toxin NEGATIVE NEGATIVE Final   C Diff interpretation No C. difficile detected.  Final    Comment: Performed at [facility], [address] [place], [place] [address]  Blood Culture (routine x 2)     Status: None (Preliminary result)   Collection Time: 09/22/18  1:14 PM  Result Value Ref Range Status   Specimen Description BLOOD BLOOD LEFT WRIST  Final   Special Requests   Final    BOTTLES DRAWN  AEROBIC AND ANAEROBIC Blood Culture adequate volume   Culture   Final    NO GROWTH 2 DAYS Performed at [facility], [address] [place], [place] [address]    Report Status PENDING  Incomplete  Blood Culture (routine x 2)     Status: None (Preliminary result)   Collection Time: 09/22/18  1:38 PM  Result Value Ref Range Status   Specimen Description BLOOD RIGHT HAND  Final   Special Requests   Final    BOTTLES DRAWN AEROBIC AND ANAEROBIC Blood Culture results may not be optimal due to an inadequate volume of blood received in culture bottles   Culture   Final    NO GROWTH 2 DAYS Performed at [facility], [address] [place], [place] [address]    Report Status PENDING  Incomplete  Urine culture     Status: None   Collection Time: 09/22/18  5:42 PM  Result Value Ref Range Status   Specimen Description   Final    URINE, RANDOM Performed at [facility], [address], [place], [place] [address]    Special Requests   Final    NONE Performed at [facility], [address] [place], [place] [address]    Culture   Final    NO GROWTH Performed at [facility], [place] [address], [place], [place] [address]    Report Status 09/23/2018 FINAL  Final    Coagulation Studies: Recent Labs    09/22/18 1742 09/23/18 0534  LABPROT 15.6* 15.2  INR 1.3* 1.2    Urinalysis: Recent Labs    09/22/18 1748  COLORURINE YELLOW*  LABSPEC 1.016  PHURINE 5.0  GLUCOSEU NEGATIVE  HGBUR SMALL*  BILIRUBINUR NEGATIVE  KETONESUR NEGATIVE  PROTEINUR 100*  NITRITE NEGATIVE  LEUKOCYTESUR NEGATIVE      Imaging: Ct Abdomen Pelvis Wo Contrast  Result Date: 09/22/2018 CLINICAL DATA:  Fever status post appendectomy last week. EXAM: CT ABDOMEN AND PELVIS WITHOUT CONTRAST TECHNIQUE: Multidetector CT imaging of the abdomen and pelvis was performed following the standard protocol without IV contrast. COMPARISON:  CT scan of September 15, 2018. FINDINGS:  Lower chest:  No acute abnormality. Hepatobiliary: Cholelithiasis is noted. No biliary dilatation is noted. Stable small amount of free fluid is noted around the liver. No focal hepatic abnormality is noted on these unenhanced images. Pancreas: Unremarkable. No pancreatic ductal dilatation or surrounding inflammatory changes. Spleen: There is interval development of mild amount of fluid around the spleen which demonstrates Hounsfield measurement of 22 concerning for possible hemorrhage. Adrenals/Urinary Tract: Adrenal glands are unremarkable. Kidneys are normal, without renal calculi, focal lesion, or hydronephrosis. Bladder is unremarkable. Stomach/Bowel: The stomach appears normal. Status post appendectomy. There is no evidence of bowel obstruction. There is noted 10.7 x 6.9 cm abnormality in the right lower quadrant concerning for abscess or hematoma. No definite bowel dilatation is noted. Vascular/Lymphatic: Aortic atherosclerosis. No enlarged abdominal or pelvic lymph nodes. Reproductive: Prostate is unremarkable. Other: No abnormal hernia is noted. There is also noted stable hyperdense fluid in the pelvis. Musculoskeletal: Stable grade 1 anterolisthesis of L5-S1 is noted secondary to bilateral L5 pars defects. IMPRESSION: Interval development of 10.7 x 6.9 cm probable abscess or less likely hematoma seen in the right lower quadrant of the abdomen. Stable amount of probable intraperitoneal hemorrhage is noted in the pelvis and around the liver, with new fluid noted around the spleen. Critical Value/emergent results were called by telephone at the time of interpretation on 09/22/2018 at 2:20 pm to Dr. Merlyn Lot , who verbally acknowledged these results. Cholelithiasis is noted. Aortic Atherosclerosis (ICD10-I70.0). Electronically Signed   By: Marijo Conception M.D.   On: 09/22/2018 14:21   Dg Chest 1 View  Result Date: 09/23/2018 CLINICAL DATA:  Shortness of breath EXAM: CHEST  1 VIEW COMPARISON:   09/22/2018 FINDINGS: Prior CABG. Cardiomegaly with vascular congestion and mild pulmonary edema. Low volumes with bibasilar atelectasis. No visible effusions or acute bony abnormality. IMPRESSION: Cardiomegaly with vascular congestion and probable mild interstitial edema. Low volumes with bibasilar atelectasis. Electronically Signed   By: Rolm Baptise M.D.   On: 09/23/2018 20:08   Dg Chest Portable 1 View  Result Date: 09/22/2018 CLINICAL DATA:  Laparoscopic appendectomy on 09/08/2018. Patient presents with fever, shortness of breath and acute mental status changes that began yesterday. Former smoker. EXAM: PORTABLE CHEST 1 VIEW COMPARISON:  09/15/2018 and earlier. FINDINGS: Sternotomy for CABG. Cardiac silhouette moderately enlarged. Chronic elevation of the RIGHT hemidiaphragm and chronic scar/atelectasis at the RIGHT lung base. Pulmonary venous hypertension and minimal to mild interstitial pulmonary edema, new since the prior exams. No confluent airspace consolidation. No visible pleural effusions. IMPRESSION: Minimal to mild CHF, with moderate cardiomegaly and minimal to mild diffuse interstitial pulmonary edema. Electronically Signed   By: Evangeline Dakin M.D.   On: 09/22/2018 13:32   Ct Image Guided Drainage By Percutaneous Catheter  Result Date: 09/23/2018 INDICATION: History of recent appendectomy with postoperative hematoma and enlarging air-fluid collection EXAM: CT GUIDED DRAINAGE OF PERICOLONIC ABSCESS MEDICATIONS: The patient is currently admitted to the hospital and receiving intravenous antibiotics. The antibiotics were administered within an appropriate time frame prior to the initiation of the procedure. ANESTHESIA/SEDATION: None COMPLICATIONS: None immediate. TECHNIQUE: Informed written consent was obtained from the patient's wife after a thorough discussion of the procedural risks, benefits and alternatives. All questions were addressed. Maximal Sterile Barrier Technique was utilized  including caps, mask, sterile gowns, sterile gloves, sterile drape, hand hygiene and skin antiseptic. A timeout was performed prior to the initiation of the procedure. PROCEDURE: The operative field was prepped with Chlorhexidine in a sterile fashion, and a sterile drape was applied  covering the operative field. A sterile gown and sterile gloves were used for the procedure. Local anesthesia was provided with 1% Lidocaine. Utilizing 1% lidocaine as a local and deep peritoneal anesthetic and CT fluoroscopic guidance, a 10 French pigtail drainage catheter was placed into the right lower quadrant air-fluid collection utilizing a quick stick technique. The catheter was then coiled within the collection. Aspiration was performed an yielded infected postoperative hematoma. A sample was sent for laboratory evaluation. The catheter was then sutured into place utilizing 0 Prolene and a Hopkins buttress. Catheter was then dressed in the standard sterile manner. The catheter was attached to a suction grenade. The patient tolerated well and returned his room in satisfactory condition. IMPRESSION: Successful CT-guided percutaneous drain placement in a right lower quadrant air-fluid collection which represents an infected postoperative hematoma. Electronically Signed   By: Inez Catalina M.D.   On: 09/23/2018 14:31     Medications:   . sodium chloride Stopped (09/23/18 1247)  . piperacillin-tazobactam (ZOSYN)  IV 3.375 g (09/24/18 0857)   . acetaminophen  1,000 mg Oral Q6H  . carvedilol  6.25 mg Oral BID WC  . pantoprazole (PROTONIX) IV  40 mg Intravenous QHS  . sodium chloride flush  10 mL Intracatheter Daily   sodium chloride, acetaminophen, acetaminophen, hydrALAZINE, morphine injection, ondansetron **OR** ondansetron (ZOFRAN) IV, oxyCODONE  Assessment/ Plan:  63 y.o. African-American male  with diabetes mellitus type II, diabetic neuropathy, hypertension, coronary artery disease status post CABG, peripheral  vascular disease, status post left toe amputation, congestive heart failure, asthma, who was admitted to [facility] on[facility]/7/2020for Acute appendicitis.  Hospital course complicated by acute renal failure and right lower quadrant abscess.  Patient was discharged on 4/20.  Return on 4/21 for altered mental status  1. Acute renal failure on chronic kidney disease stage III with proteinuria: baseline creatinine of 2.23, GFR of 35 on 08/10/2018.  Chronic Kidney Disease secondary to diabetic nephropathy Acute renal failure secondary to ATN from IV contrast nephropathy (4/7) and possibly from abscess  - Holding lisinopril, furosemide, spironolactone, potassium chloride, and metolazone.  -Renal function remains compromised. -We will see if draining the abscess resolves and results in improvement of renal function GFR is 14.  Patient is getting close to dialysis however electrolytes and volume status are acceptable.  No acute indication of dialysis at present  2. Hypertension with CKD: Blood pressure control is acceptable3. Diabetes mellitus type II with chronic kidney disease: insulin dependent. not well controlled. Hemoglobin A1c of 9.1% on 01/14/18 Currently on carvedilol, hydralazine (PRN) Avoid ACE inhibitor at this time  4. Anemia with renal failure:  Hemoglobin currently 8.1 Continue to monitor closely   LOS: [place] 4/23/202012:02 PM  [place], [place]  Note: This note was prepared with Dragon dictation. Any transcription errors are unintentional

## 2018-09-25 LAB — BASIC METABOLIC PANEL
Anion gap: 12 (ref 5–15)
BUN: 66 mg/dL — ABNORMAL HIGH (ref 8–23)
CO2: 23 mmol/L (ref 22–32)
Calcium: 7.9 mg/dL — ABNORMAL LOW (ref 8.9–10.3)
Chloride: 107 mmol/L (ref 98–111)
Creatinine, Ser: 4.59 mg/dL — ABNORMAL HIGH (ref 0.61–1.24)
GFR calc Af Amer: 15 mL/min — ABNORMAL LOW (ref >60)
GFR calc non Af Amer: 13 mL/min — ABNORMAL LOW (ref >60)
Glucose, Bld: 117 mg/dL — ABNORMAL HIGH (ref 70–99)
Potassium: 4 mmol/L (ref 3.5–5.1)
Sodium: 142 mmol/L (ref 135–145)

## 2018-09-25 LAB — CBC
HCT: 27.4 % — ABNORMAL LOW (ref 39.0–52.0)
Hemoglobin: 8.3 g/dL — ABNORMAL LOW (ref 13.0–17.0)
MCH: 27.2 pg (ref 26.0–34.0)
MCHC: 30.3 g/dL (ref 30.0–36.0)
MCV: 89.8 fL (ref 80.0–100.0)
Platelets: 272 10*3/uL (ref 150–400)
RBC: 3.05 MIL/uL — ABNORMAL LOW (ref 4.22–5.81)
RDW: 16.3 % — ABNORMAL HIGH (ref 11.5–15.5)
WBC: 10.6 10*3/uL — ABNORMAL HIGH (ref 4.0–10.5)
nRBC: 0 % (ref 0.0–0.2)

## 2018-09-25 LAB — GLUCOSE, CAPILLARY
Glucose-Capillary: 115 mg/dL — ABNORMAL HIGH (ref 70–99)
Glucose-Capillary: 117 mg/dL — ABNORMAL HIGH (ref 70–99)
Glucose-Capillary: 122 mg/dL — ABNORMAL HIGH (ref 70–99)

## 2018-09-25 LAB — MAGNESIUM: Magnesium: 2.3 mg/dL (ref 1.7–2.4)

## 2018-09-25 LAB — PHOSPHORUS: Phosphorus: 5.7 mg/dL — ABNORMAL HIGH (ref 2.5–4.6)

## 2018-09-25 MED ORDER — CEFAZOLIN SODIUM-DEXTROSE 1-4 GM/50ML-% IV SOLN
1.0000 g | Freq: Two times a day (BID) | INTRAVENOUS | Status: DC
  Administered 2018-09-25 – 2018-09-26 (×2): 1 g via INTRAVENOUS
  Filled 2018-09-25 (×4): qty 50

## 2018-09-25 MED ORDER — METRONIDAZOLE IN NACL 5-0.79 MG/ML-% IV SOLN
500.0000 mg | Freq: Three times a day (TID) | INTRAVENOUS | Status: DC
  Administered 2018-09-25 – 2018-09-26 (×3): 500 mg via INTRAVENOUS
  Filled 2018-09-25 (×5): qty 100

## 2018-09-25 MED ORDER — PIPERACILLIN-TAZOBACTAM 3.375 G IVPB: 3.3750 g | Freq: Three times a day (TID) | INTRAVENOUS | Status: DC

## 2018-09-25 MED ORDER — TRAZODONE HCL 50 MG PO TABS
50.0000 mg | ORAL_TABLET | Freq: Every day | ORAL | Status: DC
  Administered 2018-09-25: 22:00:00 50 mg via ORAL
  Filled 2018-09-25: qty 1

## 2018-09-25 NOTE — Consult Note (Signed)
Pharmacy Antibiotic Note  Phillip [name] is a 63 y.o. male admitted on 09/22/2018 with an intra-abdominal abscess.  Pharmacy has been consulted for Zosyn dosing. He is s/p lap appy for gangrenous appendicitis 4/7. His SCr is notably elevated above his baseline of 2.23 mg/dL and is trending higher  Plan: Day 4- Renal fxn improved- Crcl 22 ml/min- will adjust Zosyn to 3.375g IV q8h (4 hour infusion).  F/u abscess cx    Height: 6\' 3"  (696.2 cm) Weight: 240 lb (108.9 kg) IBW/kg (Calculated) : 84.5  Temp (24hrs), Avg:98.7 F (37.1 C), Min:97.9 F (36.6 C), Max:99.5 F (37.5 C)  Recent Labs  Lab 09/21/18 0347 09/22/18 1313 09/22/18 1314 09/23/18 0534 09/24/18 0546 09/25/18 0345  WBC 21.3* 16.3*  --  14.2* 11.5* 10.6*  CREATININE 4.24* 4.68*  --  4.86* 4.87* 4.59*  LATICACIDVEN  --   --  1.2  --   --   --     Estimated Creatinine Clearance: 22 mL/min (A) (by C-G formula based on SCr of 4.59 mg/dL (H)).    No Known Allergies  Antimicrobials this admission: Zosyn 4/21 >>   Microbiology results: 4/21 BCx: NGTD 4/21 UCx: pending  4/15 C diff: negative  4/13 BCx: NGF 4/22 abscess cx: e.coli- sens pending  Thank you for allowing pharmacy to be a part of this patient's care.  Judithe Keetch A, PharmD 09/25/2018 11:04 AM

## 2018-09-25 NOTE — Patient Outreach (Addendum)
Deactivated Emmi calls due to patient still in hospital. I have notified the hospital liaisons.

## 2018-09-25 NOTE — Progress Notes (Signed)
Physical Therapy Treatment Patient Details Name: Phillip [name] MRN: [id number] DOB: [birth year] Today's Date: 09/25/2018    History of Present Illness Pt is a 63 y.o. male presenting to hospital 09/22/18 with fever, AMS, and syncopal episode (had just discharged from hospital 09/21/18--appendicitis).  Pt admitted with appendicular abscess (s/p R LQ drain placement d/t infected post-op hematoma), metabolic encephalopathy, acute renal failure on CKD stage III, and acute on chronic anemia (s/p blood transfusion).  PMH includes L toe amp 03/13/18, CHF, CAD, DM, htn, PVD.    PT Comments    Patient is able to demonstrate increased independence with STS transfer with proper hand placement and set up following PT cuing. PT led patient through stair ambulation over 5 steps, is able to achieve reciprocal gait pattern following PT cuing, and needs PT management for lines/leads, and min guard for safety. Following patient very fatigued. Would benefit from skilled PT to address above deficits and promote optimal return to PLOF    Follow Up Recommendations  Home health PT;Supervision for mobility/OOB     Equipment Recommendations  Rolling walker with 5" wheels    Recommendations for Other Services OT consult     Precautions / Restrictions      Mobility  Bed Mobility Overal bed mobility: Needs Assistance Bed Mobility: Supine to Sit     Supine to sit: Supervision Sit to supine: Supervision   General bed mobility comments: mild increased effort to perform on own  Transfers Overall transfer level: Modified independent Equipment used: Rolling walker (2 wheeled) Transfers: Sit to/from [facility] Sit to Stand: Min guard Stand pivot transfers: Min guard       General transfer comment: Patient able to stand without physical assistance with some difficulty with initiation. Good eccentric control with stand to sit  Ambulation/Gait   Gait Distance (Feet): 60 Feet Assistive  device: Rolling walker (2 wheeled) Gait Pattern/deviations: WFL(Within Functional Limits) Gait velocity: decreased       Stairs   Stairs assistance: Min guard Stair Management: Two rails Number of Stairs: 5     Wheelchair Mobility    Modified Rankin (Stroke Patients Only)       Balance                                            Cognition                                              Exercises Other Exercises Other Exercises: Patient able to complete STS without physical assistance following cuing for setup/hand placement with som increased difficulty with initiation, but is able to complete transfer with min gaurd. PT assisted patient in IV pole negotiation to ascend/descent steps with bilat handrail support where patient is able to take careful slow steps, and reciprocol pattern following PT cuing.     General Comments        Pertinent Vitals/Pain      Home Living                      Prior Function            PT Goals (current goals can now be found in the care plan section)      Frequency  Min 2X/week      PT Plan Current plan remains appropriate    Co-evaluation              AM-PAC PT "6 Clicks" Mobility   Outcome Measure  Help needed turning from your back to your side while in a flat bed without using bedrails?: A Little Help needed moving from lying on your back to sitting on the side of a flat bed without using bedrails?: A Little Help needed moving to and from a bed to a chair (including a wheelchair)?: A Little Help needed standing up from a chair using your arms (e.g., wheelchair or bedside chair)?: A Little Help needed to walk in hospital room?: A Little Help needed climbing 3-5 steps with a railing? : A Little 6 Click Score: 18    End of Session Equipment Utilized During Treatment: Gait belt Activity Tolerance: Patient tolerated treatment well Patient left: in chair;with call  bell/phone within reach;with chair alarm set;with SCD's reapplied Nurse Communication: Mobility status;Precautions PT Visit Diagnosis: Other abnormalities of gait and mobility (R26.89);Muscle weakness (generalized) (M62.81);Difficulty in walking, not elsewhere classified (R26.2)     Time: 3016-0109 PT Time Calculation (min) (ACUTE ONLY): 13 min  Charges:  $Therapeutic Activity: 8-22 mins                     Shelton Silvas PT, DPT  Shelton Silvas 09/25/2018, 3:47 PM

## 2018-09-25 NOTE — Progress Notes (Signed)
[facility], [place] 09/25/18  Subjective:   Patient known to our practice from recent admission Was discharged on April 20.  He returns via EMS from home after having fever.  Family also reported some confusion Patient had CT-guided placement of drain for infected hematoma in the right lower quadrant Draining bloody fluid.   He feels better today Serum creatinine appears to be starting to be improving.  Now down to 4.59 States he is voiding good.  625 cc recorded last 24 hours  Objective:  Vital signs in last 24 hours:  Temp:  [97.9 F (36.6 C)-98.6 F (37 C)] 98.3 F (36.8 C) (04/24 1117) Pulse Rate:  [83-93] 85 (04/24 1117) Resp:  [18-20] 18 (04/24 1117) BP: (154-163)/(76-84) 154/76 (04/24 1117) SpO2:  [98 %-100 %] 100 % (04/24 1117)  Weight change:  Filed Weights   09/22/18 1310  Weight: 108.9 kg    Intake/Output:    Intake/Output Summary (Last 24 hours) at 09/25/2018 1338 Last data filed at 09/25/2018 1307 Gross per 24 hour  Intake 92.16 ml  Output 180 ml  Net -87.84 ml     Physical Exam: General:  Sitting up on the side of the bed, no acute distress  HEENT  moist oral mucous membranes  Neck  supple, no masses  Pulm/lungs  normal breathing effort on room air  CVS/Heart  regular rhythm  Abdomen:   Soft  Extremities:  2+ pitting edema bilaterally  Neurologic:  Alert, able to follow commands  Skin:  No acute rashes    Basic Metabolic Panel:  Recent Labs  Lab 09/20/18 0529 09/21/18 0347 09/22/18 1313 09/23/18 0534 09/24/18 0546 09/25/18 0345  NA 139 141 139 142 142 142  K 4.1 4.6 4.7 4.2 4.3 4.0  CL 100 102 104 106 108 107  CO2 27 27 25 24 24 23   GLUCOSE 107* 111* 130* 62* 91 117*  BUN 63* 64* 64* 63* 62* 66*  CREATININE 4.19* 4.24* 4.68* 4.86* 4.87* 4.59*  CALCIUM 8.0* 8.2* 7.9* 8.1* 7.9* 7.9*  MG 2.1  --   --  2.3  --  2.3  PHOS 4.0  --   --  5.0*  --  5.7*     CBC: Recent Labs  Lab 09/21/18 0347  09/22/18 1313 09/23/18 0534 09/23/18 1450 09/24/18 0546 09/25/18 0345  WBC 21.3* 16.3* 14.2*  --  11.5* 10.6*  NEUTROABS  --  13.9*  --   --   --   --   HGB 9.3* 7.5* 7.6* 8.8* 8.1* 8.3*  HCT 31.0* 24.5* 25.7* 28.7* 27.0* 27.4*  MCV 91.7 90.4 92.4  --  91.8 89.8  PLT 365 292 288  --  249 272     No results found for: HEPBSAG, HEPBSAB, HEPBIGM    Microbiology:  Recent Results (from the past 240 hour(s))  C difficile quick scan w PCR reflex     Status: None   Collection Time: 09/16/18  6:39 PM  Result Value Ref Range Status   C Diff antigen NEGATIVE NEGATIVE Final   C Diff toxin NEGATIVE NEGATIVE Final   C Diff interpretation No C. difficile detected.  Final    Comment: Performed at [facility], [address] [place], [place] [address]  Blood Culture (routine x 2)     Status: None (Preliminary result)   Collection Time: 09/22/18  1:14 PM  Result Value Ref Range Status   Specimen Description BLOOD BLOOD LEFT WRIST  Final   Special  Requests   Final    BOTTLES DRAWN AEROBIC AND ANAEROBIC Blood Culture adequate volume   Culture   Final    NO GROWTH 3 DAYS Performed at [facility], [address] [place], [place] [address]    Report Status PENDING  Incomplete  Blood Culture (routine x 2)     Status: None (Preliminary result)   Collection Time: 09/22/18  1:38 PM  Result Value Ref Range Status   Specimen Description BLOOD RIGHT HAND  Final   Special Requests   Final    BOTTLES DRAWN AEROBIC AND ANAEROBIC Blood Culture results may not be optimal due to an inadequate volume of blood received in culture bottles   Culture   Final    NO GROWTH 3 DAYS Performed at [facility], [address], [place], [place] [address]    Report Status PENDING  Incomplete  Urine culture     Status: None   Collection Time: 09/22/18  5:42 PM  Result Value Ref Range Status   Specimen Description   Final    URINE, RANDOM Performed at [facility], [address], [place], [place] [address]    Special Requests   Final    NONE Performed at [facility], [address], [place], [place] [address]    Culture   Final    NO GROWTH Performed at [facility], [place] [address] [place], [place] [address]    Report Status 09/23/2018 FINAL  Final  Aerobic/Anaerobic Culture (surgical/deep wound)     Status: None (Preliminary result)   Collection Time: 09/23/18  1:20 PM  Result Value Ref Range Status   Specimen Description   Final    ABSCESS Performed at [facility], [address] [place], [place] [address]    Special Requests   Final    ABDOMINAL DRAIN Performed at [facility], [address] [place], [place] [address]    Culture   Final    ABUNDANT ESCHERICHIA COLI CULTURE REINCUBATED FOR BETTER GROWTH Performed at [facility], [place] [address] [place], [place] [phone]    Report Status PENDING  Incomplete   Organism ID, Bacteria ESCHERICHIA COLI  Final      Susceptibility   Escherichia coli - MIC*    AMPICILLIN >=32 RESISTANT Resistant     CEFAZOLIN 16 SENSITIVE Sensitive     CEFEPIME <=1 SENSITIVE Sensitive     CEFTAZIDIME <=1 SENSITIVE Sensitive     CEFTRIAXONE <=1 SENSITIVE Sensitive     CIPROFLOXACIN <=0.25 SENSITIVE Sensitive     GENTAMICIN <=1 SENSITIVE Sensitive     IMIPENEM <=0.25 SENSITIVE Sensitive     TRIMETH/SULFA >=320 RESISTANT Resistant     AMPICILLIN/SULBACTAM 16 INTERMEDIATE Intermediate     PIP/TAZO <=4 SENSITIVE Sensitive     Extended ESBL NEGATIVE Sensitive     * ABUNDANT ESCHERICHIA COLI  C difficile quick scan w PCR reflex     Status: None   Collection Time: 09/24/18  9:54 PM  Result Value Ref Range Status   C Diff antigen NEGATIVE NEGATIVE Final   C Diff toxin NEGATIVE NEGATIVE Final   C Diff interpretation No C. difficile detected.  Final    Comment: Performed at [facility], [address] [place],  [address]    Coagulation  Studies: Recent Labs    09/22/18 1742 09/23/18 0534  LABPROT 15.6* 15.2  INR 1.3* 1.2    Urinalysis: Recent Labs    09/22/18  [place] 1.016  PHURINE 5.0  GLUCOSEU NEGATIVE  HGBUR SMALL*  BILIRUBINUR NEGATIVE  KETONESUR NEGATIVE  PROTEINUR 100*  NITRITE NEGATIVE  LEUKOCYTESUR NEGATIVE      Imaging: Dg Chest 1 View  Result Date: 09/23/2018 CLINICAL DATA:  Shortness of breath EXAM: CHEST  1 VIEW COMPARISON:  09/22/2018 FINDINGS: Prior CABG. Cardiomegaly with vascular congestion and mild pulmonary edema. Low volumes with bibasilar atelectasis. No visible effusions or acute bony abnormality. IMPRESSION: Cardiomegaly with vascular congestion and probable mild interstitial edema. Low volumes with bibasilar atelectasis. Electronically Signed   By: Rolm Baptise M.D.   On: 09/23/2018 20:08   Ct Image Guided Drainage By Percutaneous Catheter  Result Date: 09/23/2018 INDICATION: History of recent appendectomy with postoperative hematoma and enlarging air-fluid collection EXAM: CT GUIDED DRAINAGE OF PERICOLONIC ABSCESS MEDICATIONS: The patient is currently admitted to the hospital and receiving intravenous antibiotics. The antibiotics were administered within an appropriate time frame prior to the initiation of the procedure. ANESTHESIA/SEDATION: None COMPLICATIONS: None immediate. TECHNIQUE: Informed written consent was obtained from the patient's wife after a thorough discussion of the procedural risks, benefits and alternatives. All questions were addressed. Maximal Sterile Barrier Technique was utilized including caps, mask, sterile gowns, sterile gloves, sterile drape, hand hygiene and skin antiseptic. A timeout was performed prior to the initiation of the procedure. PROCEDURE: The operative field was prepped with Chlorhexidine in a sterile fashion, and a sterile drape was applied covering the operative field. A sterile gown and sterile gloves were used for the  procedure. Local anesthesia was provided with 1% Lidocaine. Utilizing 1% lidocaine as a local and deep peritoneal anesthetic and CT fluoroscopic guidance, a 10 French pigtail drainage catheter was placed into the right lower quadrant air-fluid collection utilizing a quick stick technique. The catheter was then coiled within the collection. Aspiration was performed an yielded infected postoperative hematoma. A sample was sent for laboratory evaluation. The catheter was then sutured into place utilizing 0 Prolene and a Hopkins buttress. Catheter was then dressed in the standard sterile manner. The catheter was attached to a suction grenade. The patient tolerated well and returned his room in satisfactory condition. IMPRESSION: Successful CT-guided percutaneous drain placement in a right lower quadrant air-fluid collection which represents an infected postoperative hematoma. Electronically Signed   By: Inez Catalina M.D.   On: 09/23/2018 14:31     Medications:   . sodium chloride Stopped (09/23/18 1247)  . piperacillin-tazobactam (ZOSYN)  IV     . acetaminophen  1,000 mg Oral Q6H  . carvedilol  6.25 mg Oral BID WC  . pantoprazole (PROTONIX) IV  40 mg Intravenous QHS  . sodium chloride flush  10 mL Intracatheter Daily  . traZODone  50 mg Oral QHS   sodium chloride, hydrALAZINE, morphine injection, ondansetron **OR** ondansetron (ZOFRAN) IV, oxyCODONE  Assessment/ Plan:  63 y.o. African-American male  with diabetes mellitus type II, diabetic neuropathy, hypertension, coronary artery disease status post CABG, peripheral vascular disease, status post left toe amputation, congestive heart failure, asthma, who was admitted to [facility] on4/7/2020for Acute appendicitis.  Hospital course complicated by acute renal failure and right lower quadrant abscess.  Patient was discharged on 4/20.  Return on 4/21 for altered mental status  1. Acute renal failure on chronic kidney disease stage III with proteinuria:  baseline creatinine of 2.23, GFR of 35 on 08/10/2018.  Chronic Kidney Disease secondary to diabetic nephropathy Acute renal failure secondary to ATN from IV contrast nephropathy (4/7) and possibly  from abscess  - Holding lisinopril, furosemide, spironolactone, potassium chloride, and metolazone.  -Renal function remains compromised. -Serum creatinine appears to be starting to improve.  Patient is getting close to dialysis however electrolytes and volume status are acceptable.  No acute indication of dialysis at present  2. Hypertension with CKD: Blood pressure control is acceptable  3. Diabetes mellitus type II with chronic kidney disease: insulin dependent. not well controlled. Hemoglobin A1c of 9.1% on 01/14/18 Currently on carvedilol, hydralazine (PRN) Avoid ACE inhibitor at this time  4. Anemia with renal failure:  Hemoglobin currently 8.3 Continue to monitor closely  5.  Postop right lower quadrant abscess Now drained Management as per surgery team   LOS: [place] 4/24/20201:38 PM  [place], [place]  Note: This note was prepared with Dragon dictation. Any transcription errors are unintentional

## 2018-09-25 NOTE — Progress Notes (Signed)
[place] at [facility]                                                                                                                                                                                  Patient Demographics   Phillip [name], is a 63 y.o. male, DOB - May 29, 1956, IRJ:[id number]  Admit date - 09/22/2018   Admitting Physician Jules Husbands, MD  Outpatient Primary MD for the patient is Raina Mina., MD   LOS - 3  Subjective: Patient states that he is feeling better blood sugars are stable   Review of Systems:   CONSTITUTIONAL: No documented fever. No fatigue, weakness. No weight gain, no weight loss.  EYES: No blurry or double vision.  ENT: No tinnitus. No postnasal drip. No redness of the oropharynx.  RESPIRATORY: No cough, no wheeze, no hemoptysis. No dyspnea.  CARDIOVASCULAR: No chest pain. No orthopnea. No palpitations. No syncope.  GASTROINTESTINAL: No nausea, no vomiting or diarrhea. No abdominal pain. No melena or hematochezia.  GENITOURINARY: No dysuria or hematuria.  ENDOCRINE: No polyuria or nocturia. No heat or cold intolerance.  HEMATOLOGY: No anemia. No bruising. No bleeding.  INTEGUMENTARY: No rashes. No lesions.  MUSCULOSKELETAL: No arthritis. No swelling. No gout.  NEUROLOGIC: No numbness, tingling, or ataxia. No seizure-type activity.  PSYCHIATRIC: No anxiety. No insomnia. No ADD.    Vitals:   Vitals:   09/24/18 1304 09/24/18 1729 09/24/18 2001 09/25/18 0421  BP: (!) 158/76 (!) 163/81 (!) 156/77 (!) 162/84  Pulse: 87 93 90 83  Resp: (!) 26  20 20   Temp: 99.5 F (37.5 C)  98.6 F (37 C) 97.9 F (36.6 C)  TempSrc: Oral  Oral Oral  SpO2: 98%  98% 99%  Weight:      Height:        Wt Readings from Last 3 Encounters:  09/22/18 108.9 kg  09/08/18 110.2 kg  04/24/18 98.6 kg     Intake/Output Summary (Last 24 hours) at 09/25/2018 1106 Last data filed at 09/25/2018 0600 Gross per 24 hour  Intake 100.05 ml   Output 630 ml  Net -529.95 ml    Physical Exam:   GENERAL: Pleasant-appearing in no apparent distress.  HEAD, EYES, EARS, NOSE AND THROAT: Atraumatic, normocephalic. Extraocular muscles are intact. Pupils equal and reactive to light. Sclerae anicteric. No conjunctival injection. No oro-pharyngeal erythema.  NECK: Supple. There is no jugular venous distention. No bruits, no lymphadenopathy, no thyromegaly.  HEART: Regular rate and rhythm,. No murmurs, no rubs, no clicks.  LUNGS: Clear to auscultation bilaterally. No rales or rhonchi. No wheezes.  ABDOMEN: Soft, flat, nontender, nondistended. Has good bowel sounds. No  hepatosplenomegaly appreciated.  EXTREMITIES: No evidence of any cyanosis, clubbing, or peripheral edema.  +2 pedal and radial pulses bilaterally.  NEUROLOGIC: The patient is alert, awake, and oriented x3 with no focal motor or sensory deficits appreciated bilaterally.  SKIN: Moist and warm with no rashes appreciated.  Psych: Not anxious, depressed LN: No inguinal LN enlargement    Antibiotics   Anti-infectives (From admission, onward)   Start     Dose/Rate Route Frequency Ordered Stop   09/25/18 1400  piperacillin-tazobactam (ZOSYN) IVPB 3.375 g     3.375 g 12.5 mL/hr over 240 Minutes Intravenous Every 8 hours 09/25/18 1104     09/23/18 2200  piperacillin-tazobactam (ZOSYN) IVPB 3.375 g  Status:  Discontinued     3.375 g 12.5 mL/hr over 240 Minutes Intravenous Every 12 hours 09/23/18 0859 09/25/18 1104   09/23/18 0500  piperacillin-tazobactam (ZOSYN) IVPB 3.375 g  Status:  Discontinued     3.375 g 12.5 mL/hr over 240 Minutes Intravenous Every 8 hours 09/23/18 0220 09/23/18 0859   09/22/18 1845  piperacillin-tazobactam (ZOSYN) IVPB 3.375 g  Status:  Discontinued     3.375 g 12.5 mL/hr over 240 Minutes Intravenous Every 8 hours 09/22/18 1831 09/23/18 0220   09/22/18 1430  piperacillin-tazobactam (ZOSYN) IVPB 3.375 g  Status:  Discontinued     3.375 g 100 mL/hr over  30 Minutes Intravenous  Once 09/22/18 1428 09/22/18 1429   09/22/18 1430  cefTRIAXone (ROCEPHIN) 1 g in sodium chloride 0.9 % 100 mL IVPB     1 g 200 mL/hr over 30 Minutes Intravenous  Once 09/22/18 1429 09/22/18 1508   09/22/18 1430  metroNIDAZOLE (FLAGYL) IVPB 500 mg     500 mg 100 mL/hr over 60 Minutes Intravenous  Once 09/22/18 1429 09/22/18 1610      Medications   Scheduled Meds: . acetaminophen  1,000 mg Oral Q6H  . carvedilol  6.25 mg Oral BID WC  . pantoprazole (PROTONIX) IV  40 mg Intravenous QHS  . sodium chloride flush  10 mL Intracatheter Daily  . traZODone  50 mg Oral QHS   Continuous Infusions: . sodium chloride Stopped (09/23/18 1247)  . piperacillin-tazobactam (ZOSYN)  IV     PRN Meds:.sodium chloride, hydrALAZINE, morphine injection, ondansetron **OR** ondansetron (ZOFRAN) IV, oxyCODONE   Data Review:   Micro Results Recent Results (from the past 240 hour(s))  C difficile quick scan w PCR reflex     Status: None   Collection Time: 09/16/18  6:39 PM  Result Value Ref Range Status   C Diff antigen NEGATIVE NEGATIVE Final   C Diff toxin NEGATIVE NEGATIVE Final   C Diff interpretation No C. difficile detected.  Final    Comment: Performed at [facility], [address] [place], [place] [address]  Blood Culture (routine x 2)     Status: None (Preliminary result)   Collection Time: 09/22/18  1:14 PM  Result Value Ref Range Status   Specimen Description BLOOD BLOOD LEFT WRIST  Final   Special Requests   Final    BOTTLES DRAWN AEROBIC AND ANAEROBIC Blood Culture adequate volume   Culture   Final    NO GROWTH 3 DAYS Performed at [facility], [address], [place], [place] [address]    Report Status PENDING  Incomplete  Blood Culture (routine x 2)     Status: None (Preliminary result)   Collection Time: 09/22/18  1:38 PM  Result Value Ref Range Status   Specimen Description BLOOD RIGHT  HAND  Final   Special Requests   Final     BOTTLES DRAWN AEROBIC AND ANAEROBIC Blood Culture results may not be optimal due to an inadequate volume of blood received in culture bottles   Culture   Final    NO GROWTH 3 DAYS Performed at [facility], [address] [place], [place] [address]    Report Status PENDING  Incomplete  Urine culture     Status: None   Collection Time: 09/22/18  5:42 PM  Result Value Ref Range Status   Specimen Description   Final    URINE, RANDOM Performed at [facility], [address], [place], [place] [address]    Special Requests   Final    NONE Performed at [facility], [address], [place], [place] [address]    Culture   Final    NO GROWTH Performed at [facility], [place] [address] [place], [place] [address]    Report Status 09/23/2018 FINAL  Final  Aerobic/Anaerobic Culture (surgical/deep wound)     Status: None (Preliminary result)   Collection Time: 09/23/18  1:20 PM  Result Value Ref Range Status   Specimen Description   Final    ABSCESS Performed at [facility], [address], [place], [place] [address]    Special Requests   Final    ABDOMINAL DRAIN Performed at [facility], [address] [place], [place] [address]    Culture   Final    ABUNDANT ESCHERICHIA COLI SUSCEPTIBILITIES TO FOLLOW CULTURE REINCUBATED FOR BETTER GROWTH Performed at [facility], [place] [address] [place], [place] [address]    Report Status PENDING  Incomplete  C difficile quick scan w PCR reflex     Status: None   Collection Time: 09/24/18  9:54 PM  Result Value Ref Range Status   C Diff antigen NEGATIVE NEGATIVE Final   C Diff toxin NEGATIVE NEGATIVE Final   C Diff interpretation No C. difficile detected.  Final    Comment: Performed at [facility], [address], [place],  [address]    Radiology Reports Ct Abdomen Pelvis Wo Contrast  Result Date: 09/22/2018 CLINICAL DATA:  Fever status post appendectomy  last week. EXAM: CT ABDOMEN AND PELVIS WITHOUT CONTRAST TECHNIQUE: Multidetector CT imaging of the abdomen and pelvis was performed following the standard protocol without IV contrast. COMPARISON:  CT scan of September 15, 2018. FINDINGS: Lower chest: No acute abnormality. Hepatobiliary: Cholelithiasis is noted. No biliary dilatation is noted. Stable small amount of free fluid is noted around the liver. No focal hepatic abnormality is noted on these unenhanced images. Pancreas: Unremarkable. No pancreatic ductal dilatation or surrounding inflammatory changes. Spleen: There is interval development of mild amount of fluid around the spleen which demonstrates Hounsfield measurement of 22 concerning for possible hemorrhage. Adrenals/Urinary Tract: Adrenal glands are unremarkable. Kidneys are normal, without renal calculi, focal lesion, or hydronephrosis. Bladder is unremarkable. Stomach/Bowel: The stomach appears normal. Status post appendectomy. There is no evidence of bowel obstruction. There is noted 10.7 x 6.9 cm abnormality in the right lower quadrant concerning for abscess or hematoma. No definite bowel dilatation is noted. Vascular/Lymphatic: Aortic atherosclerosis. No enlarged abdominal or pelvic lymph nodes. Reproductive: Prostate is unremarkable. Other: No abnormal hernia is noted. There is also noted stable hyperdense fluid in the pelvis. Musculoskeletal: Stable grade 1 anterolisthesis of L5-S1 is noted secondary to bilateral L5 pars defects. IMPRESSION: Interval development of 10.7 x 6.9 cm  probable abscess or less likely hematoma seen in the right lower quadrant of the abdomen. Stable amount of probable intraperitoneal hemorrhage is noted in the pelvis and around the liver, with new fluid noted around the spleen. Critical Value/emergent results were called by telephone at the time of interpretation on 09/22/2018 at 2:20 pm to Dr. Merlyn Lot , who verbally acknowledged these results. Cholelithiasis is  noted. Aortic Atherosclerosis (ICD10-I70.0). Electronically Signed   By: Marijo Conception M.D.   On: 09/22/2018 14:21   Ct Abdomen Pelvis Wo Contrast  Result Date: 09/15/2018 CLINICAL DATA:  Fever.  Status post appendectomy. EXAM: CT ABDOMEN AND PELVIS WITHOUT CONTRAST TECHNIQUE: Multidetector CT imaging of the abdomen and pelvis was performed following the standard protocol without IV contrast. COMPARISON:  09/11/2018 FINDINGS: Lower chest: Bibasilar atelectasis.  Small right pleural effusion. Hepatobiliary: Multiple gallstones.  Unremarkable liver. Pancreas: Unremarkable Spleen: Unremarkable Adrenals/Urinary Tract: Kidneys and adrenal glands are within normal limits. Foley catheter is positioned in the bladder. Stomach/Bowel: Stomach is distended with contrast. Dilated proximal small bowel loops are stable. Distal small bowel loops are decompressed. Overall small bowel distension has slightly improved. Colon is also decompressed. Vascular/Lymphatic: Mild aortic and iliac artery vascular calcifications. No abnormal retroperitoneal adenopathy. Reproductive: Prostate is small. Other: Hyperdense free fluid within the lower abdomen and pelvis is not significantly changed. Small amount of hyperdense fluid about the liver is stable. Musculoskeletal: Bilateral L5 pars defects and grade 1 spondylolisthesis. No vertebral compression deformity. IMPRESSION: Dilated proximal small bowel loops have slightly improved. Distal small bowel loops in colon remain decompressed. This suggest an improving small bowel obstruction pattern. Stable hyperdense fluid within the lower abdomen and pelvis most consistent with intraperitoneal hemorrhage. Electronically Signed   By: Marybelle Killings M.D.   On: 09/15/2018 07:54   Ct Abdomen Pelvis Wo Contrast  Result Date: 09/11/2018 CLINICAL DATA:  Postop appendectomy.  Abdominal distention. EXAM: CT ABDOMEN AND PELVIS WITHOUT CONTRAST TECHNIQUE: Multidetector CT imaging of the abdomen and  pelvis was performed following the standard protocol without IV contrast. COMPARISON:  09/08/2018 FINDINGS: Lower chest: Cardiomegaly. Bibasilar atelectasis. Coronary artery calcifications, status post CABG. Hepatobiliary: Layering gallstones within the bladder. No focal hepatic abnormality. Pancreas: No focal abnormality or ductal dilatation. Spleen: No focal abnormality.  Normal size. Adrenals/Urinary Tract: Foley catheter present in the bladder which is decompressed. No hydronephrosis. Adrenal glands unremarkable. Stomach/Bowel: Dilated small bowel loops. Distal small bowel loops and colon are decompressed. Mild distention of the stomach with contrast. Vascular/Lymphatic: Aortic atherosclerosis. No enlarged abdominal or pelvic lymph nodes. Reproductive: Mild prostate prominence. Other: Moderate complex free fluid within the abdomen and pelvis, possibly hemoperitoneum. No free air. Musculoskeletal: No acute bony abnormality. Pars defects and degenerative changes at L5 S1. IMPRESSION: Postop from appendectomy. There are mildly dilated small bowel loops. Distal small bowel loops are decompressed as is the colon. This could reflect ileus or small-bowel obstruction. Moderate complex fluid in the abdomen and pelvis, most notable in the pelvis. This may reflect blood/hemoperitoneum. Aortic atherosclerosis. Bibasilar atelectasis. Prostate enlargement. Electronically Signed   By: Rolm Baptise M.D.   On: 09/11/2018 10:18   Dg Chest 1 View  Result Date: 09/23/2018 CLINICAL DATA:  Shortness of breath EXAM: CHEST  1 VIEW COMPARISON:  09/22/2018 FINDINGS: Prior CABG. Cardiomegaly with vascular congestion and mild pulmonary edema. Low volumes with bibasilar atelectasis. No visible effusions or acute bony abnormality. IMPRESSION: Cardiomegaly with vascular congestion and probable mild interstitial edema. Low volumes with bibasilar atelectasis. Electronically Signed   By:  Rolm Baptise M.D.   On: 09/23/2018 20:08   Dg Chest  1 View  Result Date: 09/15/2018 CLINICAL DATA:  Hypoxia EXAM: CHEST  1 VIEW COMPARISON:  09/14/2018 FINDINGS: Normal heart size. Postoperative changes from CABG. Low volumes and bibasilar atelectasis. No pneumothorax. No pleural effusion. IMPRESSION: Bibasilar atelectasis and low lung volumes. Electronically Signed   By: Marybelle Killings M.D.   On: 09/15/2018 09:02   Dg Abd 1 View  Result Date: 09/10/2018 CLINICAL DATA:  Abdominal distention EXAM: ABDOMEN - 1 VIEW COMPARISON:  09/08/2018 abdominal CT FINDINGS: Gas dilated small bowel with less colonic gas. No concerning mass effect or gas collection. Lung bases are clear. IMPRESSION: Dilated small bowel. Recent surgery suggests ileus, but there is less colonic gas and a degree of mechanical obstruction is also possible. Electronically Signed   By: Monte Fantasia M.D.   On: 09/10/2018 07:25   Ct Abdomen Pelvis W Contrast  Result Date: 09/08/2018 CLINICAL DATA:  63 year old male with a history of right lower quadrant pain EXAM: CT ABDOMEN AND PELVIS WITH CONTRAST TECHNIQUE: Multidetector CT imaging of the abdomen and pelvis was performed using the standard protocol following bolus administration of intravenous contrast. CONTRAST:  179mL OMNIPAQUE IOHEXOL 300 MG/ML  SOLN COMPARISON:  None. FINDINGS: Lower chest: No acute finding of the lower chest. 6 mm nodule at the periphery of the left lower lobe (image 11 of series 4). Hepatobiliary: Unremarkable appearance of liver. Cholelithiasis without associated inflammatory changes. Pancreas: Unremarkable Spleen: Unremarkable Adrenals/Urinary Tract: Unremarkable appearance of the adrenal glands. No evidence of hydronephrosis of the right or left kidney. No nephrolithiasis. Unremarkable course of the bilateral ureters. Unremarkable appearance of the urinary bladder. Stomach/Bowel: Unremarkable stomach. Proximal small bowel unremarkable with no focal wall thickening or transition point. Majority of distal small bowel is  unremarkable. There is circumferential wall enhancement at the terminal ileum just above the IC valve. Minimal inflammatory changes in the adjacent fat with trace free fluid. Inflammatory changes surround the appendix with fecalized material/gas within the lumen. No evidence of perforation. No calcified appendicoliths. Colon is relatively unremarkable with mild stool burden. Colonic diverticula without associated inflammatory changes. Vascular/Lymphatic: Atherosclerotic calcifications of the abdominal aorta and the bilateral iliac arteries. No adenopathy. Reproductive: Unremarkable pelvic structures Other: No abdominal wall hernia. Musculoskeletal: Bilateral L5 pars defect with grade 1 anterolisthesis and associated degenerative disc disease of L5-S1. Vacuum disc phenomenon at this level. No acute displaced fracture. Degenerative changes of the hips. IMPRESSION: Acute appendicitis, uncomplicated. There is a presumed reactive ileitis of the terminal ileum. These results were discussed by telephone at the time of interpretation on 09/08/2018 at 2:21 pm with Dr. Lenise Arena. Cholelithiasis without evidence of acute cholecystitis. Nodule at the left lung base, 6 mm. Non-contrast chest CT at 6-12 months is recommended. If the nodule is stable at time of repeat CT, then future CT at 18-24 months (from today's scan) is considered optional for low-risk patients, but is recommended for high-risk patients. This recommendation follows the consensus statement: Guidelines for Management of Incidental Pulmonary Nodules Detected on CT Images: From the Fleischner Society 2017; Radiology 2017; 284:228-243. Electronically Signed   By: Corrie Mckusick D.O.   On: 09/08/2018 14:21   Dg Chest Portable 1 View  Result Date: 09/22/2018 CLINICAL DATA:  Laparoscopic appendectomy on 09/08/2018. Patient presents with fever, shortness of breath and acute mental status changes that began yesterday. Former smoker. EXAM: PORTABLE CHEST 1 VIEW  COMPARISON:  09/15/2018 and earlier. FINDINGS: Sternotomy for CABG. Cardiac  silhouette moderately enlarged. Chronic elevation of the RIGHT hemidiaphragm and chronic scar/atelectasis at the RIGHT lung base. Pulmonary venous hypertension and minimal to mild interstitial pulmonary edema, new since the prior exams. No confluent airspace consolidation. No visible pleural effusions. IMPRESSION: Minimal to mild CHF, with moderate cardiomegaly and minimal to mild diffuse interstitial pulmonary edema. Electronically Signed   By: Evangeline Dakin M.D.   On: 09/22/2018 13:32   Dg Chest Port 1 View  Result Date: 09/14/2018 CLINICAL DATA:  Fevers EXAM: PORTABLE CHEST 1 VIEW COMPARISON:  03/25/2018 FINDINGS: Cardiac shadow is prominent but stable. Postsurgical changes are again noted. The overall inspiratory effort is poor. Mild increased vascular congestion noted without significant edema. Bibasilar atelectatic changes are noted. No bony abnormality is noted. IMPRESSION: Mild bibasilar atelectasis. Mild vascular congestion. Electronically Signed   By: Inez Catalina M.D.   On: 09/14/2018 21:59   Ct Image Guided Drainage By Percutaneous Catheter  Result Date: 09/23/2018 INDICATION: History of recent appendectomy with postoperative hematoma and enlarging air-fluid collection EXAM: CT GUIDED DRAINAGE OF PERICOLONIC ABSCESS MEDICATIONS: The patient is currently admitted to the hospital and receiving intravenous antibiotics. The antibiotics were administered within an appropriate time frame prior to the initiation of the procedure. ANESTHESIA/SEDATION: None COMPLICATIONS: None immediate. TECHNIQUE: Informed written consent was obtained from the patient's wife after a thorough discussion of the procedural risks, benefits and alternatives. All questions were addressed. Maximal Sterile Barrier Technique was utilized including caps, mask, sterile gowns, sterile gloves, sterile drape, hand hygiene and skin antiseptic. A timeout was  performed prior to the initiation of the procedure. PROCEDURE: The operative field was prepped with Chlorhexidine in a sterile fashion, and a sterile drape was applied covering the operative field. A sterile gown and sterile gloves were used for the procedure. Local anesthesia was provided with 1% Lidocaine. Utilizing 1% lidocaine as a local and deep peritoneal anesthetic and CT fluoroscopic guidance, a 10 French pigtail drainage catheter was placed into the right lower quadrant air-fluid collection utilizing a quick stick technique. The catheter was then coiled within the collection. Aspiration was performed an yielded infected postoperative hematoma. A sample was sent for laboratory evaluation. The catheter was then sutured into place utilizing 0 Prolene and a Hopkins buttress. Catheter was then dressed in the standard sterile manner. The catheter was attached to a suction grenade. The patient tolerated well and returned his room in satisfactory condition. IMPRESSION: Successful CT-guided percutaneous drain placement in a right lower quadrant air-fluid collection which represents an infected postoperative hematoma. Electronically Signed   By: Inez Catalina M.D.   On: 09/23/2018 14:31     CBC Recent Labs  Lab 09/21/18 0347 09/22/18 1313 09/23/18 0534 09/23/18 1450 09/24/18 0546 09/25/18 0345  WBC 21.3* 16.3* 14.2*  --  11.5* 10.6*  HGB 9.3* 7.5* 7.6* 8.8* 8.1* 8.3*  HCT 31.0* 24.5* 25.7* 28.7* 27.0* 27.4*  PLT 365 292 288  --  249 272  MCV 91.7 90.4 92.4  --  91.8 89.8  MCH 27.5 27.7 27.3  --  27.6 27.2  MCHC 30.0 30.6 29.6*  --  30.0 30.3  RDW 16.9* 16.9* 16.6*  --  16.5* 16.3*  LYMPHSABS  --  1.2  --   --   --   --   MONOABS  --  1.0  --   --   --   --   EOSABS  --  0.1  --   --   --   --   BASOSABS  --  0.0  --   --   --   --     Chemistries  Recent Labs  Lab 09/20/18 0529 09/21/18 0347 09/22/18 1313 09/23/18 0534 09/24/18 0546 09/25/18 0345  NA 139 141 139 142 142 142  K 4.1  4.6 4.7 4.2 4.3 4.0  CL 100 102 104 106 108 107  CO2 27 27 25 24 24 23   GLUCOSE 107* 111* 130* 62* 91 117*  BUN 63* 64* 64* 63* 62* 66*  CREATININE 4.19* 4.24* 4.68* 4.86* 4.87* 4.59*  CALCIUM 8.0* 8.2* 7.9* 8.1* 7.9* 7.9*  MG 2.1  --   --  2.3  --  2.3  AST  --   --  31  --   --   --   ALT  --   --  11  --   --   --   ALKPHOS  --   --  50  --   --   --   BILITOT  --   --  1.0  --   --   --    ------------------------------------------------------------------------------------------------------------------ estimated creatinine clearance is 22 mL/min (A) (by C-G formula based on SCr of 4.59 mg/dL (H)). ------------------------------------------------------------------------------------------------------------------ No results for input(s): HGBA1C in the last 72 hours. ------------------------------------------------------------------------------------------------------------------ No results for input(s): CHOL, HDL, LDLCALC, TRIG, CHOLHDL, LDLDIRECT in the last 72 hours. ------------------------------------------------------------------------------------------------------------------ No results for input(s): TSH, T4TOTAL, T3FREE, THYROIDAB in the last 72 hours.  Invalid input(s): FREET3 ------------------------------------------------------------------------------------------------------------------ No results for input(s): VITAMINB12, FOLATE, FERRITIN, TIBC, IRON, RETICCTPCT in the last 72 hours.  Coagulation profile Recent Labs  Lab 09/22/18 1742 09/23/18 0534  INR 1.3* 1.2    No results for input(s): DDIMER in the last 72 hours.  Cardiac Enzymes No results for input(s): CKMB, TROPONINI, MYOGLOBIN in the last 168 hours.  Invalid input(s): CK ------------------------------------------------------------------------------------------------------------------ Invalid input(s): POCBNP    Assessment & Plan    63 year old male with multiple medical problems of recent acute  appendicitis status post surgery, diabetes mellitus type 2, hypertension, chronic diastolic heart failure comes today with altered mental status, fever, just discharged 2 days ago now admitted with s appendicular abscess.   #1 appendicular abscess continue IV antibiotics per surgery further management per surgery culture show E. coli #2 acute on chronic anemia, monitor hemoglobin may need further transfusions recheck CBC in the morning #3 Acute renal failure on chronic kidney disease stage III, nephrology following the patient #4.  Diabetes mellitus type 2, not well controlled, blood sugar stable with eating now  #5  History of chronic systolic heart failure, CAD, continue beta-blockers with Coreg, avoid diuretics due to renal failure and also aspirin because of possible hematoma, abdomen anemia.. #6 metabolic encephalopathy: Due to #1 continue supportive care appears stable #7 prognosis poor due to multiple medical problems,    Code Status Orders  (From admission, onward)         Start     Ordered   09/22/18 1831  Full code  Continuous     09/22/18 1831        Code Status History    Date Active Date Inactive Code Status Order ID Comments User Context   09/08/2018 1838 09/21/2018 2107 Full Code [id number]  Jules Husbands, MD Inpatient   03/13/2018 1152 03/13/2018 1730 Full Code [id number]  Samara Deist, [facility] Inpatient   03/02/2018 1033 03/02/2018 1539 Full Code [id number]  Algernon Huxley, MD Inpatient             DVT Prophylaxis SCDs  Lab Results  Component Value Date   PLT 272 09/25/2018     Time Spent in minutes   35 minutes  Greater than 50% of time spent in care coordination and counseling patient regarding the condition and plan of care.   Dustin Flock M.D on 09/25/2018 at 11:06 AM  Between 7am to 6pm - Pager - [phone]  After 6pm go to [email or web address] - Proofreader  [employer]   Office  [phone]

## 2018-09-25 NOTE — Progress Notes (Signed)
Informed Charge RN, Minette Brine, patient's spouse, Hilda Blades, would like for her to give her a call when she's able.

## 2018-09-25 NOTE — Progress Notes (Addendum)
[facility] Day(s): 3.   Post op day(s):  Marland Kitchen   Interval History: Patient seen and examined, no acute events or new complaints overnight. Patient reports that he is feeling pretty good this morning but he is frustrated with being in the hospital for so long. His drain is still putting out bloody-purulent drainage. Leukocytosis improved to 10.6K. Hgb has remained stable low at 8.3 (did get 1 unit pRBCs this admission). Renal function stable, but elevated, with sCr at 4.59 and GFR is 15 with U/O 650 ccs per chart. He is working with PT. Still having loose BMs but only 2 per day.   Review of Systems:  Constitutional: denies fever, chills  Respiratory: denies any shortness of breath  Cardiovascular: denies chest pain or palpitations  Gastrointestinal: denies abdominal pain, N/V, or diarrhea/and bowel function as per interval history Musculoskeletal: denies pain, decreased motor or sensation Integumentary: + laparoscopic incisions   Vital signs in last 24 hours: [min-max] current  Temp:  [97.9 F (36.6 C)-99.5 F (37.5 C)] 97.9 F (36.6 C) (04/24 0421) Pulse Rate:  [83-93] 83 (04/24 0421) Resp:  [20-26] 20 (04/24 0421) BP: (148-163)/(76-84) 162/84 (04/24 0421) SpO2:  [98 %-99 %] 99 % (04/24 0421)     Height: 6\' 3"  (190.5 cm) Weight: 108.9 kg BMI (Calculated): 30   Intake/Output this shift:  No intake/output data recorded.    Physical Exam:  Constitutional: alert, cooperative and no distress  Respiratory: breathing non-labored at rest  Cardiovascular: regular rate and sinus rhythm  Gastrointestinal: soft, non-tender, and non-distended. JP in RLQ with old thick blood in bulb, foul smelling, consistent with infected hematoma  Integumentary: Laparoscopic incisions are CDI, there is surrounding ecchymosis (improving), no erythema or drainage   Labs:  CBC Latest Ref Rng & Units 09/25/2018 09/24/2018 09/23/2018  WBC 4.0 - 10.5 K/uL 10.6(H)  11.5(H) -  Hemoglobin 13.0 - 17.0 g/dL 8.3(L) 8.1(L) 8.8(L)  Hematocrit 39.0 - 52.0 % 27.4(L) 27.0(L) 28.7(L)  Platelets 150 - 400 K/uL 272 249 -   CMP Latest Ref Rng & Units 09/25/2018 09/24/2018 09/23/2018  Glucose 70 - 99 mg/dL 117(H) 91 62(L)  BUN 8 - 23 mg/dL 66(H) 62(H) 63(H)  Creatinine 0.61 - 1.24 mg/dL 4.59(H) 4.87(H) 4.86(H)  Sodium 135 - 145 mmol/L 142 142 142  Potassium 3.5 - 5.1 mmol/L 4.0 4.3 4.2  Chloride 98 - 111 mmol/L 107 108 106  CO2 22 - 32 mmol/L 23 24 24   Calcium 8.9 - 10.3 mg/dL 7.9(L) 7.9(L) 8.1(L)  Total Protein 6.5 - 8.1 g/dL - - -  Total Bilirubin 0.3 - 1.2 mg/dL - - -  Alkaline Phos 38 - 126 U/L - - -  AST 15 - 41 U/L - - -  ALT 0 - 44 U/L - - -     Imaging studies: No new pertinent imaging studies   Assessment/Plan: (ICD-10's: K35.80) 63 y.o. male with improving leukocytosis and stable, but persistently elevated renal function (still making urine) who is clinically improved from presentation on re-admission 17 days s/p laparoscopic appendectomy for acute appendicitis for which his post-operative course has been complicated by acute on chronic renal failure and failure to thrive   - On renal diet; continue as toelrates   - Continue IV ABx (Zosyn); follow up cultures (growing e coli - susceptibility pending)  - Monitor abdominal examination; on-going bowel function             - Monitor JP drain output              -  No indication for emergent surgical intervention at this time - Monitor leukocytosis; morning CBC - Monitor Hgb; transfuse as needed - Monitor renal function; U/O; nephrology following ;no indication acutely for HD - Medical management of comorbidities; appreciate hospitalist help  - Working with PT; recommending [place] anticoagulation   All of the above findings and recommendations were discussed with the patient, patient's family (Wife [name] via phone),  and the medical team, and all of patient's questions were answered to her expressed satisfaction.  -- Edison Simon, PA-C [facility] 09/25/2018, 8:02 AM [phone] M-F: 7am - 4pm

## 2018-09-25 NOTE — Care Management Important Message (Signed)
Important Message  Patient Details  Name: Phillip [name] MRN: [id number] Date of Birth: 1955/11/20   Medicare Important Message Given:  Yes    Dannette Barbara 09/25/2018, 10:49 AM

## 2018-09-26 LAB — GLUCOSE, CAPILLARY
Glucose-Capillary: 107 mg/dL — ABNORMAL HIGH (ref 70–99)
Glucose-Capillary: 110 mg/dL — ABNORMAL HIGH (ref 70–99)
Glucose-Capillary: 114 mg/dL — ABNORMAL HIGH (ref 70–99)

## 2018-09-26 LAB — CBC
HCT: 28.3 % — ABNORMAL LOW (ref 39.0–52.0)
Hemoglobin: 8.6 g/dL — ABNORMAL LOW (ref 13.0–17.0)
MCH: 27.3 pg (ref 26.0–34.0)
MCHC: 30.4 g/dL (ref 30.0–36.0)
MCV: 89.8 fL (ref 80.0–100.0)
Platelets: 334 10*3/uL (ref 150–400)
RBC: 3.15 MIL/uL — ABNORMAL LOW (ref 4.22–5.81)
RDW: 16.1 % — ABNORMAL HIGH (ref 11.5–15.5)
WBC: 8.2 10*3/uL (ref 4.0–10.5)
nRBC: 0 % (ref 0.0–0.2)

## 2018-09-26 LAB — BASIC METABOLIC PANEL
Anion gap: 12 (ref 5–15)
BUN: 60 mg/dL — ABNORMAL HIGH (ref 8–23)
CO2: 21 mmol/L — ABNORMAL LOW (ref 22–32)
Calcium: 8.1 mg/dL — ABNORMAL LOW (ref 8.9–10.3)
Chloride: 108 mmol/L (ref 98–111)
Creatinine, Ser: 4.05 mg/dL — ABNORMAL HIGH (ref 0.61–1.24)
GFR calc Af Amer: 17 mL/min — ABNORMAL LOW (ref >60)
GFR calc non Af Amer: 15 mL/min — ABNORMAL LOW (ref >60)
Glucose, Bld: 108 mg/dL — ABNORMAL HIGH (ref 70–99)
Potassium: 3.9 mmol/L (ref 3.5–5.1)
Sodium: 141 mmol/L (ref 135–145)

## 2018-09-26 LAB — TYPE AND SCREEN
ABO/RH(D): O POS
Antibody Screen: POSITIVE
DAT, IgG: POSITIVE
DAT, complement: NEGATIVE
Unit division: 0
Unit division: 0

## 2018-09-26 LAB — BPAM RBC
Blood Product Expiration Date: 202004292359
Blood Product Expiration Date: 202005282359
ISSUE DATE / TIME: 202004220724
Unit Type and Rh: 5100
Unit Type and Rh: 9500

## 2018-09-26 MED ORDER — CIPROFLOXACIN HCL 500 MG PO TABS: 500.0000 mg | ORAL_TABLET | Freq: Two times a day (BID) | ORAL | 0 refills | Status: AC

## 2018-09-26 MED ORDER — CARVEDILOL 12.5 MG PO TABS: 12.5000 mg | ORAL_TABLET | Freq: Two times a day (BID) | ORAL | Status: DC

## 2018-09-26 MED ORDER — METRONIDAZOLE 500 MG PO TABS: 500.0000 mg | ORAL_TABLET | Freq: Three times a day (TID) | ORAL | 0 refills | Status: AC

## 2018-09-26 NOTE — Progress Notes (Signed)
Performed education with patient regarding how to care for and drain JP drain. Gave patient supplies to flush the JP drain and instructed him to contact his [place] office if he ran out of supplies. Patient verbalized his understanding and did not have any questions or difficulty.

## 2018-09-26 NOTE — Progress Notes (Signed)
Physical Therapy Treatment Patient Details Name: Phillip [name] MRN: [id number] DOB: [birth year] Today's Date: 09/26/2018    History of Present Illness Pt is a 63 y.o. male presenting to hospital 09/22/18 with fever, AMS, and syncopal episode (had just discharged from hospital 09/21/18--appendicitis).  Pt admitted with appendicular abscess (s/p R LQ drain placement d/t infected post-op hematoma), metabolic encephalopathy, acute renal failure on CKD stage III, and acute on chronic anemia (s/p blood transfusion).  PMH includes L toe amp 03/13/18, CHF, CAD, DM, htn, PVD.    PT Comments    Patient in room with RN and MD, discussing POC with family member via telephone. PT provided insight of patients mobility/limitations during this admission. Pt agreeable to mobilize at end of conversation. Sit <> stand with RW and initially supervision, CGA provided due to standing balance deficits that quickly improved with RW, no LOB noted.  PT ambulated ~155ft with RW and supervision. PT complained of fatigue and did not want to ambulate further at this time, HR and spO2 WFLs. 5 times sit to stand assessed, able to perform in 14seconds with use of bilateral UE support, indicating pt would benefit from further skilled PT intervention to address deficits in strength and endurance. Overall the patient demonstrates improvements and progression towards goals this session.      Follow Up Recommendations  Home health PT;Supervision for mobility/OOB     Equipment Recommendations  Rolling walker with 5" wheels    Recommendations for Other Services OT consult     Precautions / Restrictions Precautions Precautions: Fall Precaution Comments: Abdominal drain Restrictions Weight Bearing Restrictions: No    Mobility  Bed Mobility               General bed mobility comments: deferred up in chair at start of session  Transfers Overall transfer level: Needs assistance Equipment used: Rolling walker (2  wheeled) Transfers: Sit to/from Stand Sit to Stand: Min guard;Supervision         General transfer comment: Pt able to stand with supervision, CGA given due to initial standing balance deficits, no LOB noted and steadiness improved with mobility. Needs verbal cues for hand placement for controlled descent to chair  Ambulation/Gait Ambulation/Gait assistance: Supervision Gait Distance (Feet): 130 Feet Assistive device: Rolling walker (2 wheeled) Gait Pattern/deviations: WFL(Within Functional Limits) Gait velocity: decreased   General Gait Details: mild narrow BOS noted, as well as decreased stride length   Stairs             Wheelchair Mobility    Modified Rankin (Stroke Patients Only)       Balance Overall balance assessment: Needs assistance Sitting-balance support: No upper extremity supported;Feet supported Sitting balance-Leahy Scale: Normal       Standing balance-Leahy Scale: Fair Standing balance comment: prefers UE support                            Cognition Arousal/Alertness: Awake/alert Behavior During Therapy: WFL for tasks assessed/performed Overall Cognitive Status: Within Functional Limits for tasks assessed                                        Exercises Other Exercises Other Exercises: 5 times sit to stand performed, able to complete in 14seconds, use of bilateral hands.     General Comments        Pertinent Vitals/Pain Pain Assessment:  No/denies pain    Home Living                      Prior Function            PT Goals (current goals can now be found in the care plan section) Progress towards PT goals: Progressing toward goals    Frequency    Min 2X/week      PT Plan Current plan remains appropriate    Co-evaluation              AM-PAC PT "6 Clicks" Mobility   Outcome Measure  Help needed turning from your back to your side while in a flat bed without using bedrails?: A  Little Help needed moving from lying on your back to sitting on the side of a flat bed without using bedrails?: A Little Help needed moving to and from a bed to a chair (including a wheelchair)?: A Little Help needed standing up from a chair using your arms (e.g., wheelchair or bedside chair)?: A Little Help needed to walk in hospital room?: A Little Help needed climbing 3-5 steps with a railing? : A Little 6 Click Score: 18    End of Session Equipment Utilized During Treatment: Gait belt Activity Tolerance: Patient tolerated treatment well Patient left: in chair;with call bell/phone within reach;with chair alarm set Nurse Communication: Mobility status PT Visit Diagnosis: Other abnormalities of gait and mobility (R26.89);Muscle weakness (generalized) (M62.81);Difficulty in walking, not elsewhere classified (R26.2)     Time: 2481-8590 PT Time Calculation (min) (ACUTE ONLY): 16 min  Charges:  $Therapeutic Exercise: 8-22 mins                     Lieutenant Diego PT, DPT 12:02 PM,09/26/18 [phone]

## 2018-09-26 NOTE — Progress Notes (Signed)
[place] at [facility]                                                                                                                                                                                  Patient Demographics   Phillip [name], is a 63 y.o. male, DOB - Aug 23, 1955, IRC:[id number]  Admit date - 09/22/2018   Admitting Physician Jules Husbands, MD  Outpatient Primary MD for the patient is Raina Mina., MD   LOS - 4  Subjective: Patient is eager to go home.  Denies any pain, p.o. intake is better, voiding, has no problems sleeping.   Review of Systems:   CONSTITUTIONAL: No documented fever. No fatigue, weakness. No weight gain, no weight loss.  EYES: No blurry or double vision.  ENT: No tinnitus. No postnasal drip. No redness of the oropharynx.  RESPIRATORY: No cough, no wheeze, no hemoptysis. No dyspnea.  CARDIOVASCULAR: No chest pain. No orthopnea. No palpitations. No syncope.  GASTROINTESTINAL: No nausea, no vomiting or diarrhea. No abdominal pain. No melena or hematochezia.  GENITOURINARY: No dysuria or hematuria.  ENDOCRINE: No polyuria or nocturia. No heat or cold intolerance.  HEMATOLOGY: No anemia. No bruising. No bleeding.  INTEGUMENTARY: No rashes. No lesions.  MUSCULOSKELETAL: No arthritis. No swelling. No gout.  NEUROLOGIC: No numbness, tingling, or ataxia. No seizure-type activity.  PSYCHIATRIC: No anxiety. No insomnia. No ADD.    Vitals:   Vitals:   09/25/18 0421 09/25/18 1117 09/25/18 2108 09/26/18 0327  BP: (!) 162/84 (!) 154/76 (!) 154/85 (!) 162/78  Pulse: 83 85 81 78  Resp: 20 18 20 20   Temp: 97.9 F (36.6 C) 98.3 F (36.8 C) 98.2 F (36.8 C) 98.4 F (36.9 C)  TempSrc: Oral Oral Oral Oral  SpO2: 99% 100% 100% 100%  Weight:      Height:        Wt Readings from Last 3 Encounters:  09/22/18 108.9 kg  09/08/18 110.2 kg  04/24/18 98.6 kg     Intake/Output Summary (Last 24 hours) at 09/26/2018 1155 Last data  filed at 09/26/2018 0900 Gross per 24 hour  Intake 670.04 ml  Output 930 ml  Net -259.96 ml    Physical Exam:   GENERAL: Pleasant-appearing in no apparent distress.  HEAD, EYES, EARS, NOSE AND THROAT: Atraumatic, normocephalic. Extraocular muscles are intact. Pupils equal and reactive to light. Sclerae anicteric. No conjunctival injection. No oro-pharyngeal erythema.  NECK: Supple. There is no jugular venous distention. No bruits, no lymphadenopathy, no thyromegaly.  HEART: Regular rate and rhythm,. No murmurs, no rubs, no clicks.  LUNGS: Clear to auscultation bilaterally. No rales or rhonchi. No wheezes.  ABDOMEN: Soft, flat, nontender, nondistended. Has good bowel sounds. No hepatosplenomegaly appreciated.  EXTREMITIES: No evidence of any cyanosis, clubbing, or peripheral edema.  +2 pedal and radial pulses bilaterally.  NEUROLOGIC: The patient is alert, awake, and oriented x3 with no focal motor or sensory deficits appreciated bilaterally.  SKIN: Moist and warm with no rashes appreciated.  Psych: Not anxious, depressed LN: No inguinal LN enlargement    Antibiotics   Anti-infectives (From admission, onward)   Start     Dose/Rate Route Frequency Ordered Stop   09/25/18 1800  ceFAZolin (ANCEF) IVPB 1 g/50 mL premix     1 g 100 mL/hr over 30 Minutes Intravenous Every 12 hours 09/25/18 1627     09/25/18 1700  metroNIDAZOLE (FLAGYL) IVPB 500 mg     500 mg 100 mL/hr over 60 Minutes Intravenous Every 8 hours 09/25/18 1627     09/25/18 1400  piperacillin-tazobactam (ZOSYN) IVPB 3.375 g  Status:  Discontinued     3.375 g 12.5 mL/hr over 240 Minutes Intravenous Every 8 hours 09/25/18 1104 09/25/18 1627   09/23/18 2200  piperacillin-tazobactam (ZOSYN) IVPB 3.375 g  Status:  Discontinued     3.375 g 12.5 mL/hr over 240 Minutes Intravenous Every 12 hours 09/23/18 0859 09/25/18 1104   09/23/18 0500  piperacillin-tazobactam (ZOSYN) IVPB 3.375 g  Status:  Discontinued     3.375 g 12.5 mL/hr  over 240 Minutes Intravenous Every 8 hours 09/23/18 0220 09/23/18 0859   09/22/18 1845  piperacillin-tazobactam (ZOSYN) IVPB 3.375 g  Status:  Discontinued     3.375 g 12.5 mL/hr over 240 Minutes Intravenous Every 8 hours 09/22/18 1831 09/23/18 0220   09/22/18 1430  piperacillin-tazobactam (ZOSYN) IVPB 3.375 g  Status:  Discontinued     3.375 g 100 mL/hr over 30 Minutes Intravenous  Once 09/22/18 1428 09/22/18 1429   09/22/18 1430  cefTRIAXone (ROCEPHIN) 1 g in sodium chloride 0.9 % 100 mL IVPB     1 g 200 mL/hr over 30 Minutes Intravenous  Once 09/22/18 1429 09/22/18 1508   09/22/18 1430  metroNIDAZOLE (FLAGYL) IVPB 500 mg     500 mg 100 mL/hr over 60 Minutes Intravenous  Once 09/22/18 1429 09/22/18 1610      Medications   Scheduled Meds: . acetaminophen  1,000 mg Oral Q6H  . carvedilol  6.25 mg Oral BID WC  . pantoprazole (PROTONIX) IV  40 mg Intravenous QHS  . sodium chloride flush  10 mL Intracatheter Daily  . traZODone  50 mg Oral QHS   Continuous Infusions: . sodium chloride 250 mL (09/26/18 0604)  .  ceFAZolin (ANCEF) IV 1 g (09/26/18 5621)  . metronidazole 500 mg (09/26/18 0959)   PRN Meds:.sodium chloride, hydrALAZINE, morphine injection, ondansetron **OR** ondansetron (ZOFRAN) IV, oxyCODONE   Data Review:   Micro Results Recent Results (from the past 240 hour(s))  C difficile quick scan w PCR reflex     Status: None   Collection Time: 09/16/18  6:39 PM  Result Value Ref Range Status   C Diff antigen NEGATIVE NEGATIVE Final   C Diff toxin NEGATIVE NEGATIVE Final   C Diff interpretation No C. difficile detected.  Final    Comment: Performed at [facility], [address] [address] [place], [place] [address]  Blood Culture (routine x 2)     Status: None (Preliminary result)   Collection Time: 09/22/18  1:14 PM  Result Value Ref Range Status   Specimen Description BLOOD BLOOD LEFT WRIST  Final  Special Requests   Final    BOTTLES DRAWN AEROBIC AND ANAEROBIC  Blood Culture adequate volume   Culture   Final    NO GROWTH 4 DAYS Performed at [facility], [address] [place], [place] [address]    Report Status PENDING  Incomplete  Blood Culture (routine x 2)     Status: None (Preliminary result)   Collection Time: 09/22/18  1:38 PM  Result Value Ref Range Status   Specimen Description BLOOD RIGHT HAND  Final   Special Requests   Final    BOTTLES DRAWN AEROBIC AND ANAEROBIC Blood Culture results may not be optimal due to an inadequate volume of blood received in culture bottles   Culture   Final    NO GROWTH 4 DAYS Performed at [facility], [address], [place], [place] [address]    Report Status PENDING  Incomplete  Urine culture     Status: None   Collection Time: 09/22/18  5:42 PM  Result Value Ref Range Status   Specimen Description   Final    URINE, RANDOM Performed at [facility], [address], [place], [place] [address]    Special Requests   Final    NONE Performed at [facility], [address], [place], [place] [address]    Culture   Final    NO GROWTH Performed at [facility], [place] [address], [place], [place] [address]    Report Status 09/23/2018 FINAL  Final  Aerobic/Anaerobic Culture (surgical/deep wound)     Status: None (Preliminary result)   Collection Time: 09/23/18  1:20 PM  Result Value Ref Range Status   Specimen Description   Final    ABSCESS Performed at [facility], [address], [place], [place] [address]    Special Requests   Final    ABDOMINAL DRAIN Performed at [facility], [address] [place], [place] [address]    Culture   Final    ABUNDANT ESCHERICHIA COLI HOLDING FOR POSSIBLE ANAEROBE Performed at [facility], [place] [address] [place], [place] [address]    Report Status PENDING  Incomplete   Organism ID, Bacteria ESCHERICHIA COLI  Final      Susceptibility   Escherichia coli - MIC*    AMPICILLIN >=32  RESISTANT Resistant     CEFAZOLIN 16 SENSITIVE Sensitive     CEFEPIME <=1 SENSITIVE Sensitive     CEFTAZIDIME <=1 SENSITIVE Sensitive     CEFTRIAXONE <=1 SENSITIVE Sensitive     CIPROFLOXACIN <=0.25 SENSITIVE Sensitive     GENTAMICIN <=1 SENSITIVE Sensitive     IMIPENEM <=0.25 SENSITIVE Sensitive     TRIMETH/SULFA >=320 RESISTANT Resistant     AMPICILLIN/SULBACTAM 16 INTERMEDIATE Intermediate     PIP/TAZO <=4 SENSITIVE Sensitive     Extended ESBL NEGATIVE Sensitive     * ABUNDANT ESCHERICHIA COLI  C difficile quick scan w PCR reflex     Status: None   Collection Time: 09/24/18  9:54 PM  Result Value Ref Range Status   C Diff antigen NEGATIVE NEGATIVE Final   C Diff toxin NEGATIVE NEGATIVE Final   C Diff interpretation No C. difficile detected.  Final    Comment: Performed at [facility], [address], [place],  [address]    Radiology Reports Ct Abdomen Pelvis Wo Contrast  Result Date: 09/22/2018 CLINICAL DATA:  Fever status post appendectomy last week. EXAM: CT ABDOMEN AND PELVIS WITHOUT CONTRAST TECHNIQUE: Multidetector  CT imaging of the abdomen and pelvis was performed following the standard protocol without IV contrast. COMPARISON:  CT scan of September 15, 2018. FINDINGS: Lower chest: No acute abnormality. Hepatobiliary: Cholelithiasis is noted. No biliary dilatation is noted. Stable small amount of free fluid is noted around the liver. No focal hepatic abnormality is noted on these unenhanced images. Pancreas: Unremarkable. No pancreatic ductal dilatation or surrounding inflammatory changes. Spleen: There is interval development of mild amount of fluid around the spleen which demonstrates Hounsfield measurement of 22 concerning for possible hemorrhage. Adrenals/Urinary Tract: Adrenal glands are unremarkable. Kidneys are normal, without renal calculi, focal lesion, or hydronephrosis. Bladder is unremarkable. Stomach/Bowel: The stomach appears normal. Status post  appendectomy. There is no evidence of bowel obstruction. There is noted 10.7 x 6.9 cm abnormality in the right lower quadrant concerning for abscess or hematoma. No definite bowel dilatation is noted. Vascular/Lymphatic: Aortic atherosclerosis. No enlarged abdominal or pelvic lymph nodes. Reproductive: Prostate is unremarkable. Other: No abnormal hernia is noted. There is also noted stable hyperdense fluid in the pelvis. Musculoskeletal: Stable grade 1 anterolisthesis of L5-S1 is noted secondary to bilateral L5 pars defects. IMPRESSION: Interval development of 10.7 x 6.9 cm probable abscess or less likely hematoma seen in the right lower quadrant of the abdomen. Stable amount of probable intraperitoneal hemorrhage is noted in the pelvis and around the liver, with new fluid noted around the spleen. Critical Value/emergent results were called by telephone at the time of interpretation on 09/22/2018 at 2:20 pm to Dr. Merlyn Lot , who verbally acknowledged these results. Cholelithiasis is noted. Aortic Atherosclerosis (ICD10-I70.0). Electronically Signed   By: Marijo Conception M.D.   On: 09/22/2018 14:21   Ct Abdomen Pelvis Wo Contrast  Result Date: 09/15/2018 CLINICAL DATA:  Fever.  Status post appendectomy. EXAM: CT ABDOMEN AND PELVIS WITHOUT CONTRAST TECHNIQUE: Multidetector CT imaging of the abdomen and pelvis was performed following the standard protocol without IV contrast. COMPARISON:  09/11/2018 FINDINGS: Lower chest: Bibasilar atelectasis.  Small right pleural effusion. Hepatobiliary: Multiple gallstones.  Unremarkable liver. Pancreas: Unremarkable Spleen: Unremarkable Adrenals/Urinary Tract: Kidneys and adrenal glands are within normal limits. Foley catheter is positioned in the bladder. Stomach/Bowel: Stomach is distended with contrast. Dilated proximal small bowel loops are stable. Distal small bowel loops are decompressed. Overall small bowel distension has slightly improved. Colon is also  decompressed. Vascular/Lymphatic: Mild aortic and iliac artery vascular calcifications. No abnormal retroperitoneal adenopathy. Reproductive: Prostate is small. Other: Hyperdense free fluid within the lower abdomen and pelvis is not significantly changed. Small amount of hyperdense fluid about the liver is stable. Musculoskeletal: Bilateral L5 pars defects and grade 1 spondylolisthesis. No vertebral compression deformity. IMPRESSION: Dilated proximal small bowel loops have slightly improved. Distal small bowel loops in colon remain decompressed. This suggest an improving small bowel obstruction pattern. Stable hyperdense fluid within the lower abdomen and pelvis most consistent with intraperitoneal hemorrhage. Electronically Signed   By: Marybelle Killings M.D.   On: 09/15/2018 07:54   Ct Abdomen Pelvis Wo Contrast  Result Date: 09/11/2018 CLINICAL DATA:  Postop appendectomy.  Abdominal distention. EXAM: CT ABDOMEN AND PELVIS WITHOUT CONTRAST TECHNIQUE: Multidetector CT imaging of the abdomen and pelvis was performed following the standard protocol without IV contrast. COMPARISON:  09/08/2018 FINDINGS: Lower chest: Cardiomegaly. Bibasilar atelectasis. Coronary artery calcifications, status post CABG. Hepatobiliary: Layering gallstones within the bladder. No focal hepatic abnormality. Pancreas: No focal abnormality or ductal dilatation. Spleen: No focal abnormality.  Normal size. Adrenals/Urinary Tract: Foley catheter present in  the bladder which is decompressed. No hydronephrosis. Adrenal glands unremarkable. Stomach/Bowel: Dilated small bowel loops. Distal small bowel loops and colon are decompressed. Mild distention of the stomach with contrast. Vascular/Lymphatic: Aortic atherosclerosis. No enlarged abdominal or pelvic lymph nodes. Reproductive: Mild prostate prominence. Other: Moderate complex free fluid within the abdomen and pelvis, possibly hemoperitoneum. No free air. Musculoskeletal: No acute bony  abnormality. Pars defects and degenerative changes at L5 S1. IMPRESSION: Postop from appendectomy. There are mildly dilated small bowel loops. Distal small bowel loops are decompressed as is the colon. This could reflect ileus or small-bowel obstruction. Moderate complex fluid in the abdomen and pelvis, most notable in the pelvis. This may reflect blood/hemoperitoneum. Aortic atherosclerosis. Bibasilar atelectasis. Prostate enlargement. Electronically Signed   By: Rolm Baptise M.D.   On: 09/11/2018 10:18   Dg Chest 1 View  Result Date: 09/23/2018 CLINICAL DATA:  Shortness of breath EXAM: CHEST  1 VIEW COMPARISON:  09/22/2018 FINDINGS: Prior CABG. Cardiomegaly with vascular congestion and mild pulmonary edema. Low volumes with bibasilar atelectasis. No visible effusions or acute bony abnormality. IMPRESSION: Cardiomegaly with vascular congestion and probable mild interstitial edema. Low volumes with bibasilar atelectasis. Electronically Signed   By: Rolm Baptise M.D.   On: 09/23/2018 20:08   Dg Chest 1 View  Result Date: 09/15/2018 CLINICAL DATA:  Hypoxia EXAM: CHEST  1 VIEW COMPARISON:  09/14/2018 FINDINGS: Normal heart size. Postoperative changes from CABG. Low volumes and bibasilar atelectasis. No pneumothorax. No pleural effusion. IMPRESSION: Bibasilar atelectasis and low lung volumes. Electronically Signed   By: Marybelle Killings M.D.   On: 09/15/2018 09:02   Dg Abd 1 View  Result Date: 09/10/2018 CLINICAL DATA:  Abdominal distention EXAM: ABDOMEN - 1 VIEW COMPARISON:  09/08/2018 abdominal CT FINDINGS: Gas dilated small bowel with less colonic gas. No concerning mass effect or gas collection. Lung bases are clear. IMPRESSION: Dilated small bowel. Recent surgery suggests ileus, but there is less colonic gas and a degree of mechanical obstruction is also possible. Electronically Signed   By: Monte Fantasia M.D.   On: 09/10/2018 07:25   Ct Abdomen Pelvis W Contrast  Result Date: 09/08/2018 CLINICAL DATA:   63 year old male with a history of right lower quadrant pain EXAM: CT ABDOMEN AND PELVIS WITH CONTRAST TECHNIQUE: Multidetector CT imaging of the abdomen and pelvis was performed using the standard protocol following bolus administration of intravenous contrast. CONTRAST:  164mL OMNIPAQUE IOHEXOL 300 MG/ML  SOLN COMPARISON:  None. FINDINGS: Lower chest: No acute finding of the lower chest. 6 mm nodule at the periphery of the left lower lobe (image 11 of series 4). Hepatobiliary: Unremarkable appearance of liver. Cholelithiasis without associated inflammatory changes. Pancreas: Unremarkable Spleen: Unremarkable Adrenals/Urinary Tract: Unremarkable appearance of the adrenal glands. No evidence of hydronephrosis of the right or left kidney. No nephrolithiasis. Unremarkable course of the bilateral ureters. Unremarkable appearance of the urinary bladder. Stomach/Bowel: Unremarkable stomach. Proximal small bowel unremarkable with no focal wall thickening or transition point. Majority of distal small bowel is unremarkable. There is circumferential wall enhancement at the terminal ileum just above the IC valve. Minimal inflammatory changes in the adjacent fat with trace free fluid. Inflammatory changes surround the appendix with fecalized material/gas within the lumen. No evidence of perforation. No calcified appendicoliths. Colon is relatively unremarkable with mild stool burden. Colonic diverticula without associated inflammatory changes. Vascular/Lymphatic: Atherosclerotic calcifications of the abdominal aorta and the bilateral iliac arteries. No adenopathy. Reproductive: Unremarkable pelvic structures Other: No abdominal wall hernia. Musculoskeletal: Bilateral L5 pars  defect with grade 1 anterolisthesis and associated degenerative disc disease of L5-S1. Vacuum disc phenomenon at this level. No acute displaced fracture. Degenerative changes of the hips. IMPRESSION: Acute appendicitis, uncomplicated. There is a presumed  reactive ileitis of the terminal ileum. These results were discussed by telephone at the time of interpretation on 09/08/2018 at 2:21 pm with Dr. Lenise Arena. Cholelithiasis without evidence of acute cholecystitis. Nodule at the left lung base, 6 mm. Non-contrast chest CT at 6-12 months is recommended. If the nodule is stable at time of repeat CT, then future CT at 18-24 months (from today's scan) is considered optional for low-risk patients, but is recommended for high-risk patients. This recommendation follows the consensus statement: Guidelines for Management of Incidental Pulmonary Nodules Detected on CT Images: From the Fleischner Society 2017; Radiology 2017; 284:228-243. Electronically Signed   By: Corrie Mckusick D.O.   On: 09/08/2018 14:21   Dg Chest Portable 1 View  Result Date: 09/22/2018 CLINICAL DATA:  Laparoscopic appendectomy on 09/08/2018. Patient presents with fever, shortness of breath and acute mental status changes that began yesterday. Former smoker. EXAM: PORTABLE CHEST 1 VIEW COMPARISON:  09/15/2018 and earlier. FINDINGS: Sternotomy for CABG. Cardiac silhouette moderately enlarged. Chronic elevation of the RIGHT hemidiaphragm and chronic scar/atelectasis at the RIGHT lung base. Pulmonary venous hypertension and minimal to mild interstitial pulmonary edema, new since the prior exams. No confluent airspace consolidation. No visible pleural effusions. IMPRESSION: Minimal to mild CHF, with moderate cardiomegaly and minimal to mild diffuse interstitial pulmonary edema. Electronically Signed   By: Evangeline Dakin M.D.   On: 09/22/2018 13:32   Dg Chest Port 1 View  Result Date: 09/14/2018 CLINICAL DATA:  Fevers EXAM: PORTABLE CHEST 1 VIEW COMPARISON:  03/25/2018 FINDINGS: Cardiac shadow is prominent but stable. Postsurgical changes are again noted. The overall inspiratory effort is poor. Mild increased vascular congestion noted without significant edema. Bibasilar atelectatic changes are  noted. No bony abnormality is noted. IMPRESSION: Mild bibasilar atelectasis. Mild vascular congestion. Electronically Signed   By: Inez Catalina M.D.   On: 09/14/2018 21:59   Ct Image Guided Drainage By Percutaneous Catheter  Result Date: 09/23/2018 INDICATION: History of recent appendectomy with postoperative hematoma and enlarging air-fluid collection EXAM: CT GUIDED DRAINAGE OF PERICOLONIC ABSCESS MEDICATIONS: The patient is currently admitted to the hospital and receiving intravenous antibiotics. The antibiotics were administered within an appropriate time frame prior to the initiation of the procedure. ANESTHESIA/SEDATION: None COMPLICATIONS: None immediate. TECHNIQUE: Informed written consent was obtained from the patient's wife after a thorough discussion of the procedural risks, benefits and alternatives. All questions were addressed. Maximal Sterile Barrier Technique was utilized including caps, mask, sterile gowns, sterile gloves, sterile drape, hand hygiene and skin antiseptic. A timeout was performed prior to the initiation of the procedure. PROCEDURE: The operative field was prepped with Chlorhexidine in a sterile fashion, and a sterile drape was applied covering the operative field. A sterile gown and sterile gloves were used for the procedure. Local anesthesia was provided with 1% Lidocaine. Utilizing 1% lidocaine as a local and deep peritoneal anesthetic and CT fluoroscopic guidance, a 10 French pigtail drainage catheter was placed into the right lower quadrant air-fluid collection utilizing a quick stick technique. The catheter was then coiled within the collection. Aspiration was performed an yielded infected postoperative hematoma. A sample was sent for laboratory evaluation. The catheter was then sutured into place utilizing 0 Prolene and a Hopkins buttress. Catheter was then dressed in the standard sterile manner. The catheter was  attached to a suction grenade. The patient tolerated well and  returned his room in satisfactory condition. IMPRESSION: Successful CT-guided percutaneous drain placement in a right lower quadrant air-fluid collection which represents an infected postoperative hematoma. Electronically Signed   By: Inez Catalina M.D.   On: 09/23/2018 14:31     CBC Recent Labs  Lab 09/22/18 1313 09/23/18 0534 09/23/18 1450 09/24/18 0546 09/25/18 0345 09/26/18 0526  WBC 16.3* 14.2*  --  11.5* 10.6* 8.2  HGB 7.5* 7.6* 8.8* 8.1* 8.3* 8.6*  HCT 24.5* 25.7* 28.7* 27.0* 27.4* 28.3*  PLT 292 288  --  249 272 334  MCV 90.4 92.4  --  91.8 89.8 89.8  MCH 27.7 27.3  --  27.6 27.2 27.3  MCHC 30.6 29.6*  --  30.0 30.3 30.4  RDW 16.9* 16.6*  --  16.5* 16.3* 16.1*  LYMPHSABS 1.2  --   --   --   --   --   MONOABS 1.0  --   --   --   --   --   EOSABS 0.1  --   --   --   --   --   BASOSABS 0.0  --   --   --   --   --     Chemistries  Recent Labs  Lab 09/20/18 0529  09/22/18 1313 09/23/18 0534 09/24/18 0546 09/25/18 0345 09/26/18 0526  NA 139   < > 139 142 142 142 141  K 4.1   < > 4.7 4.2 4.3 4.0 3.9  CL 100   < > 104 106 108 107 108  CO2 27   < > 25 24 24 23  21*  GLUCOSE 107*   < > 130* 62* 91 117* 108*  BUN 63*   < > 64* 63* 62* 66* 60*  CREATININE 4.19*   < > 4.68* 4.86* 4.87* 4.59* 4.05*  CALCIUM 8.0*   < > 7.9* 8.1* 7.9* 7.9* 8.1*  MG 2.1  --   --  2.3  --  2.3  --   AST  --   --  31  --   --   --   --   ALT  --   --  11  --   --   --   --   ALKPHOS  --   --  50  --   --   --   --   BILITOT  --   --  1.0  --   --   --   --    < > = values in this interval not displayed.   ------------------------------------------------------------------------------------------------------------------ estimated creatinine clearance is 24.9 mL/min (A) (by C-G formula based on SCr of 4.05 mg/dL (H)). ------------------------------------------------------------------------------------------------------------------ No results for input(s): HGBA1C in the last 72  hours. ------------------------------------------------------------------------------------------------------------------ No results for input(s): CHOL, HDL, LDLCALC, TRIG, CHOLHDL, LDLDIRECT in the last 72 hours. ------------------------------------------------------------------------------------------------------------------ No results for input(s): TSH, T4TOTAL, T3FREE, THYROIDAB in the last 72 hours.  Invalid input(s): FREET3 ------------------------------------------------------------------------------------------------------------------ No results for input(s): VITAMINB12, FOLATE, FERRITIN, TIBC, IRON, RETICCTPCT in the last 72 hours.  Coagulation profile Recent Labs  Lab 09/22/18 1742 09/23/18 0534  INR 1.3* 1.2    No results for input(s): DDIMER in the last 72 hours.  Cardiac Enzymes No results for input(s): CKMB, TROPONINI, MYOGLOBIN in the last 168 hours.  Invalid input(s): CK ------------------------------------------------------------------------------------------------------------------ Invalid input(s): POCBNP    Assessment & Plan    63 year old male with multiple medical problems of recent acute  appendicitis status post surgery, diabetes mellitus type 2, hypertension, chronic diastolic heart failure comes today with altered mental status, fever, just discharged 2 days ago now admitted with s appendicular abscess.   #1 appendicular abscess improving, but then management as per surgery regarding discharge antibiotics.    #2 acute on chronic anemia, stable  #3 Acute renal failure on chronic kidney disease stage III, nephrology following the patient creatinine is around 4.05, patient is to follow-up with nephrology as an outpatient.,  Recommend holding diuretics until seen by nephrology as an outpatient.  #4.  Diabetes mellitus type 2, not well controlled, blood sugar stable with eating now  #5  History of chronic systolic heart failure, CAD, continue beta-blockers  with Coreg,   #6 metabolic encephalopathy: Due to #1 resolved. #7 prognosis poor due to multiple medical problems,  Currently stable for discharge from medical standpoint of view. will Sign off    Code Status Orders  (From admission, onward)         Start     Ordered   09/22/18 1831  Full code  Continuous     09/22/18 1831        Code Status History    Date Active Date Inactive Code Status Order ID Comments User Context   09/08/2018 1838 09/21/2018 2107 Full Code [id number]  Jules Husbands, MD Inpatient   03/13/2018 1152 03/13/2018 1730 Full Code [id number]  Samara Deist, [facility] Inpatient   03/02/2018 1033 03/02/2018 1539 Full Code [id number]  Algernon Huxley, MD Inpatient             DVT Prophylaxis SCDs Lab Results  Component Value Date   PLT 334 09/26/2018     Time Spent in minutes   35 minutes  Greater than 50% of time spent in care coordination and counseling patient regarding the condition and plan of care.   Epifanio Lesches M.D on 09/26/2018 at 11:55 AM  Between 7am to 6pm - Pager - [phone]  After 6pm go to [email or web address] - Proofreader  [employer]   Office  [phone]

## 2018-09-26 NOTE — Progress Notes (Signed)
[name], [place] 09/26/18  Subjective:   Patient known to our practice from recent admission. Was discharged on April 20.  He returns via EMS from home after having fever.  Family also reported some confusion Patient had CT-guided placement of drain for infected hematoma in the right lower quadrant Draining bloody fluid.    He feels better today.  Reports frustration about prolonged stay in the hospital. States he is voiding good.  850 cc recorded last 24 hours Serum creatinine appears to be improving.  Down to 4.05   Objective:  Vital signs in last 24 hours:  Temp:  [98.2 F (36.8 C)-98.4 F (36.9 C)] 98.4 F (36.9 C) (04/25 0327) Pulse Rate:  [78-81] 78 (04/25 0327) Resp:  [20] 20 (04/25 0327) BP: (154-162)/(78-85) 162/78 (04/25 0327) SpO2:  [100 %] 100 % (04/25 0327)  Weight change:  Filed Weights   09/22/18 1310  Weight: 108.9 kg    Intake/Output:    Intake/Output Summary (Last 24 hours) at 09/26/2018 1219 Last data filed at 09/26/2018 0900 Gross per 24 hour  Intake 670.04 ml  Output 930 ml  Net -259.96 ml     Physical Exam: General:  Sitting up on the side of the bed, no acute distress  HEENT  moist oral mucous membranes  Neck  supple, no masses  Pulm/lungs  normal breathing effort on room air  CVS/Heart  regular rhythm  Abdomen:   Soft  Extremities:  2+ pitting edema bilaterally  Neurologic:  Alert, able to follow commands  Skin:  No acute rashes    Basic Metabolic Panel:  Recent Labs  Lab 09/20/18 0529  09/22/18 1313 09/23/18 0534 09/24/18 0546 09/25/18 0345 09/26/18 0526  NA 139   < > 139 142 142 142 141  K 4.1   < > 4.7 4.2 4.3 4.0 3.9  CL 100   < > 104 106 108 107 108  CO2 27   < > 25 24 24 23  21*  GLUCOSE 107*   < > 130* 62* 91 117* 108*  BUN 63*   < > 64* 63* 62* 66* 60*  CREATININE 4.19*   < > 4.68* 4.86* 4.87* 4.59* 4.05*  CALCIUM 8.0*   < > 7.9* 8.1* 7.9* 7.9* 8.1*  MG 2.1  --   --  2.3  --  2.3  --    PHOS 4.0  --   --  5.0*  --  5.7*  --    < > = values in this interval not displayed.     CBC: Recent Labs  Lab 09/22/18 1313 09/23/18 0534 09/23/18 1450 09/24/18 0546 09/25/18 0345 09/26/18 0526  WBC 16.3* 14.2*  --  11.5* 10.6* 8.2  NEUTROABS 13.9*  --   --   --   --   --   HGB 7.5* 7.6* 8.8* 8.1* 8.3* 8.6*  HCT 24.5* 25.7* 28.7* 27.0* 27.4* 28.3*  MCV 90.4 92.4  --  91.8 89.8 89.8  PLT 292 288  --  249 272 334     No results found for: HEPBSAG, HEPBSAB, HEPBIGM    Microbiology:  Recent Results (from the past 240 hour(s))  C difficile quick scan w PCR reflex     Status: None   Collection Time: 09/16/18  6:39 PM  Result Value Ref Range Status   C Diff antigen NEGATIVE NEGATIVE Final   C Diff toxin NEGATIVE NEGATIVE Final   C Diff interpretation No C. difficile detected.  Final  Comment: Performed at [facility], [address] [place], [place] [address]  Blood Culture (routine x 2)     Status: None (Preliminary result)   Collection Time: 09/22/18  1:14 PM  Result Value Ref Range Status   Specimen Description BLOOD BLOOD LEFT WRIST  Final   Special Requests   Final    BOTTLES DRAWN AEROBIC AND ANAEROBIC Blood Culture adequate volume   Culture   Final    NO GROWTH 4 DAYS Performed at [facility], [address] [place], [place] [address]    Report Status PENDING  Incomplete  Blood Culture (routine x 2)     Status: None (Preliminary result)   Collection Time: 09/22/18  1:38 PM  Result Value Ref Range Status   Specimen Description BLOOD RIGHT HAND  Final   Special Requests   Final    BOTTLES DRAWN AEROBIC AND ANAEROBIC Blood Culture results may not be optimal due to an inadequate volume of blood received in culture bottles   Culture   Final    NO GROWTH 4 DAYS Performed at [facility], [address], [place], [place] [address]    Report Status PENDING  Incomplete  Urine culture     Status: None   Collection Time: 09/22/18   5:42 PM  Result Value Ref Range Status   Specimen Description   Final    URINE, RANDOM Performed at [facility], [address] [place], [place] [address]    Special Requests   Final    NONE Performed at [facility], [address], [place], [place] [address]    Culture   Final    NO GROWTH Performed at [facility], [place] [address], [place], [place] [address]    Report Status 09/23/2018 FINAL  Final  Aerobic/Anaerobic Culture (surgical/deep wound)     Status: None (Preliminary result)   Collection Time: 09/23/18  1:20 PM  Result Value Ref Range Status   Specimen Description   Final    ABSCESS Performed at [facility], [address], [place], [place] [address]    Special Requests   Final    ABDOMINAL DRAIN Performed at [facility], [place], [place] [address]    Culture   Final    ABUNDANT ESCHERICHIA COLI ABUNDANT CLOSTRIDIUM RAMOSUM ABUNDANT BACTEROIDES VULGATUS BETA LACTAMASE POSITIVE Performed at [facility], [place] [address], [place],  [address]    Report Status PENDING  Incomplete   Organism ID, Bacteria ESCHERICHIA COLI  Final      Susceptibility   Escherichia coli - MIC*    AMPICILLIN >=32 RESISTANT Resistant     CEFAZOLIN 16 SENSITIVE Sensitive     CEFEPIME <=1 SENSITIVE Sensitive     CEFTAZIDIME <=1 SENSITIVE Sensitive     CEFTRIAXONE <=1 SENSITIVE Sensitive     CIPROFLOXACIN <=0.25 SENSITIVE Sensitive     GENTAMICIN <=1 SENSITIVE Sensitive     IMIPENEM <=0.25 SENSITIVE Sensitive     TRIMETH/SULFA >=320 RESISTANT Resistant     AMPICILLIN/SULBACTAM 16 INTERMEDIATE Intermediate     PIP/TAZO <=4 SENSITIVE Sensitive     Extended ESBL NEGATIVE Sensitive     * ABUNDANT ESCHERICHIA COLI  C difficile quick scan w PCR reflex     Status: None   Collection Time: 09/24/18  9:54 PM  Result Value Ref Range Status   C Diff antigen NEGATIVE NEGATIVE Final   C Diff toxin NEGATIVE NEGATIVE  Final  C Diff interpretation No C. difficile detected.  Final    Comment: Performed at [facility], [address] [place], [place] [address]    Coagulation Studies: No results for input(s): LABPROT, INR in the last 72 hours.  Urinalysis: No results for input(s): COLORURINE, LABSPEC, PHURINE, GLUCOSEU, HGBUR, BILIRUBINUR, KETONESUR, PROTEINUR, UROBILINOGEN, NITRITE, LEUKOCYTESUR in the last 72 hours.  Invalid input(s): APPERANCEUR    Imaging: No results found.   Medications:   . sodium chloride 250 mL (09/26/18 0604)  .  ceFAZolin (ANCEF) IV 1 g (09/26/18 1117)  . metronidazole 500 mg (09/26/18 0959)   . acetaminophen  1,000 mg Oral Q6H  . carvedilol  6.25 mg Oral BID WC  . pantoprazole (PROTONIX) IV  40 mg Intravenous QHS  . sodium chloride flush  10 mL Intracatheter Daily  . traZODone  50 mg Oral QHS   sodium chloride, hydrALAZINE, morphine injection, ondansetron **OR** ondansetron (ZOFRAN) IV, oxyCODONE  Assessment/ Plan:  63 y.o. African-American male  with diabetes mellitus type II, diabetic neuropathy, hypertension, coronary artery disease status post CABG, peripheral vascular disease, status post left toe amputation, congestive heart failure, asthma, who was admitted to [facility] on[facility]7/2020for Acute appendicitis.  Hospital course complicated by acute renal failure and right lower quadrant abscess.  Patient was discharged on 4/20.  Return on 4/21 for altered mental status  1. Acute renal failure on chronic kidney disease stage III with proteinuria: baseline creatinine of 2.23, GFR of 35 on 08/10/2018.  Chronic Kidney Disease secondary to diabetic nephropathy Acute renal failure secondary to ATN from IV contrast nephropathy (4/7) and possibly from abscess  - Holding lisinopril, furosemide, spironolactone, potassium chloride, and metolazone.  -Renal function remains compromised. -Serum creatinine appears to be improving   - no indication for HD   2.  Hypertension with CKD and LE edema Blood pressure is higher Increase carvedilol  If serum creatinine trends continued to improve, we will be able to use diuretic in the near future which will help with managing edema and blood pressure  3. Diabetes mellitus type II with chronic kidney disease: insulin dependent. not well controlled. Hemoglobin A1c of 9.1% on 01/14/18 Currently on carvedilol, hydralazine (PRN) Avoid ACE inhibitor at this time  4. Anemia with renal failure:  Hemoglobin currently 8.6 Continue to monitor closely  5.  Postop right lower quadrant abscess Now drained, E Coli Management as per surgery team   LOS: [place] 4/25/202012:19 PM  [place], [place]  Note: This note was prepared with Dragon dictation. Any transcription errors are unintentional

## 2018-09-26 NOTE — Discharge Instructions (Signed)
In addition to included general post-operative instructions for Percutaneous (through skin) Abdominal Abscess Drain,  Diet: Resume home heart healthy renal-appropriate diet.   Activity: No heavy lifting >20 pounds (children, pets, laundry, garbage) or strenuous activity until follow-up with Dr. Dahlia Byes, but light activity and walking are encouraged. Do not drive or drink alcohol if taking antibiotics or narcotic pain medications.  Wound care: You may shower/get surgical incisions wet with soapy water and pat dry (do not rub incisions), but keep drain site clean and dry and no baths or submerging surgical incisions or drain underwater until follow-up.  Medications: Resume all home medications AND complete full course of prescribed oral antibiotics even if feeling better/well. For mild to moderate pain: acetaminophen (Tylenol) or ibuprofen/naproxen (if no kidney disease). Combining Tylenol with alcohol can substantially increase your risk of causing liver disease. Narcotic pain medications, if prescribed, can be used for severe pain, though may cause nausea, constipation, and drowsiness. Do not combine Tylenol and Percocet (or similar) within a 6 hour period as Percocet (and similar) contain(s) Tylenol. If you do not need the narcotic pain medication, you do not need to fill the prescription.  Call office [phone]) at any time if you have or think of any questions, worsening pain, fevers/chills, bleeding, drainage from incision site(s), or any other concerns with which we can help.

## 2018-09-27 ENCOUNTER — Telehealth: Payer: Self-pay | Admitting: Surgery

## 2018-09-27 DIAGNOSIS — I700 Atherosclerosis of aorta: Secondary | ICD-10-CM | POA: Diagnosis not present

## 2018-09-27 DIAGNOSIS — R627 Adult failure to thrive: Secondary | ICD-10-CM | POA: Diagnosis not present

## 2018-09-27 DIAGNOSIS — T8149XD Infection following a procedure, other surgical site, subsequent encounter: Secondary | ICD-10-CM | POA: Diagnosis not present

## 2018-09-27 DIAGNOSIS — I429 Cardiomyopathy, unspecified: Secondary | ICD-10-CM | POA: Diagnosis not present

## 2018-09-27 DIAGNOSIS — Z951 Presence of aortocoronary bypass graft: Secondary | ICD-10-CM | POA: Diagnosis not present

## 2018-09-27 DIAGNOSIS — Z7902 Long term (current) use of antithrombotics/antiplatelets: Secondary | ICD-10-CM | POA: Diagnosis not present

## 2018-09-27 DIAGNOSIS — Z87891 Personal history of nicotine dependence: Secondary | ICD-10-CM | POA: Diagnosis not present

## 2018-09-27 DIAGNOSIS — I5042 Chronic combined systolic (congestive) and diastolic (congestive) heart failure: Secondary | ICD-10-CM | POA: Diagnosis not present

## 2018-09-27 DIAGNOSIS — K219 Gastro-esophageal reflux disease without esophagitis: Secondary | ICD-10-CM | POA: Diagnosis not present

## 2018-09-27 DIAGNOSIS — I130 Hypertensive heart and chronic kidney disease with heart failure and stage 1 through stage 4 chronic kidney disease, or unspecified chronic kidney disease: Secondary | ICD-10-CM | POA: Diagnosis not present

## 2018-09-27 DIAGNOSIS — E1151 Type 2 diabetes mellitus with diabetic peripheral angiopathy without gangrene: Secondary | ICD-10-CM | POA: Diagnosis not present

## 2018-09-27 DIAGNOSIS — J45909 Unspecified asthma, uncomplicated: Secondary | ICD-10-CM | POA: Diagnosis not present

## 2018-09-27 DIAGNOSIS — E785 Hyperlipidemia, unspecified: Secondary | ICD-10-CM | POA: Diagnosis not present

## 2018-09-27 DIAGNOSIS — I2510 Atherosclerotic heart disease of native coronary artery without angina pectoris: Secondary | ICD-10-CM | POA: Diagnosis not present

## 2018-09-27 DIAGNOSIS — Z7982 Long term (current) use of aspirin: Secondary | ICD-10-CM | POA: Diagnosis not present

## 2018-09-27 DIAGNOSIS — D631 Anemia in chronic kidney disease: Secondary | ICD-10-CM | POA: Diagnosis not present

## 2018-09-27 DIAGNOSIS — Z9089 Acquired absence of other organs: Secondary | ICD-10-CM | POA: Diagnosis not present

## 2018-09-27 DIAGNOSIS — Z794 Long term (current) use of insulin: Secondary | ICD-10-CM | POA: Diagnosis not present

## 2018-09-27 DIAGNOSIS — N183 Chronic kidney disease, stage 3 (moderate): Secondary | ICD-10-CM | POA: Diagnosis not present

## 2018-09-27 DIAGNOSIS — G4733 Obstructive sleep apnea (adult) (pediatric): Secondary | ICD-10-CM | POA: Diagnosis not present

## 2018-09-27 LAB — CULTURE, BLOOD (ROUTINE X 2)
Culture: NO GROWTH
Culture: NO GROWTH
Special Requests: ADEQUATE

## 2018-09-27 NOTE — Telephone Encounter (Signed)
Patient's wife called this evening to inquire regarding patient's condition since his return home yesterday. She happily states that he is doing much better following his discharge from [facility] yesterday than he was feeling and doing when previously discharged. She specifically says that he has been walking and eating with more energy, normalized BM's, and slowly improving (but still sore) abdominal pain. She asked whether he should attend his scheduled follow-up appointment with Dr. Dahlia Byes tomorrow at 2:15 pm, scheduled after patient's prior discharge and was instructed to call office tomorrow morning. I will also send a message to notify office and Dr. Dahlia Byes of this possible schedule change. Patient's wife also asked regarding when to resume patient's furosemide and was advised to call his nephrologist tomorrow morning. He denies SOB.  Patient and his wife instructed to call if any questions or concerns.  -- Marilynne Drivers Rosana Hoes, MD, [place]: [facility] - Partnering for exceptional care. Office: [phone]

## 2018-09-28 ENCOUNTER — Other Ambulatory Visit: Payer: Self-pay

## 2018-09-28 ENCOUNTER — Encounter: Payer: PPO | Admitting: Surgery

## 2018-09-28 ENCOUNTER — Telehealth: Payer: Self-pay | Admitting: Surgery

## 2018-09-28 DIAGNOSIS — N179 Acute kidney failure, unspecified: Secondary | ICD-10-CM

## 2018-09-28 NOTE — Patient Outreach (Signed)
Transition of care: Discharged from [facility] on 09/26/2018  Placed call to patient, no answer  PLAN: will call back in 3-4 days. Will mail unsuccessful outreach letter.  Tomasa Rand, RN, BSN, CEN [facility] [employer] [phone]

## 2018-09-28 NOTE — Telephone Encounter (Signed)
D/w wife regarding his condition. He is doing well. HE is eating and has some constipation. Miralax recommended on a prn basis. Has lower extremity edema still. Explained that given his renal failure this will take a while to resolve. She will contact nephrology regarding use of lasix if appropriate. She tell me the his BP is "good". She will continue to monitor BP TID per nephrology recs. THere is a bout 25cc day of drainage from the tube. I offered her to have a visit today but she rather do it next week. All questions were ansered

## 2018-09-29 DIAGNOSIS — E1122 Type 2 diabetes mellitus with diabetic chronic kidney disease: Secondary | ICD-10-CM | POA: Diagnosis not present

## 2018-09-29 DIAGNOSIS — K651 Peritoneal abscess: Secondary | ICD-10-CM

## 2018-09-29 DIAGNOSIS — Z794 Long term (current) use of insulin: Secondary | ICD-10-CM | POA: Diagnosis not present

## 2018-09-29 DIAGNOSIS — I130 Hypertensive heart and chronic kidney disease with heart failure and stage 1 through stage 4 chronic kidney disease, or unspecified chronic kidney disease: Secondary | ICD-10-CM | POA: Diagnosis not present

## 2018-09-29 DIAGNOSIS — M86272 Subacute osteomyelitis, left ankle and foot: Secondary | ICD-10-CM | POA: Diagnosis not present

## 2018-09-29 DIAGNOSIS — N184 Chronic kidney disease, stage 4 (severe): Secondary | ICD-10-CM | POA: Diagnosis not present

## 2018-09-29 DIAGNOSIS — I5020 Unspecified systolic (congestive) heart failure: Secondary | ICD-10-CM | POA: Diagnosis not present

## 2018-09-29 HISTORY — DX: Peritoneal abscess: K65.1

## 2018-09-29 LAB — AEROBIC/ANAEROBIC CULTURE W GRAM STAIN (SURGICAL/DEEP WOUND)

## 2018-10-01 ENCOUNTER — Other Ambulatory Visit: Payer: Self-pay

## 2018-10-01 DIAGNOSIS — E1152 Type 2 diabetes mellitus with diabetic peripheral angiopathy with gangrene: Secondary | ICD-10-CM | POA: Diagnosis not present

## 2018-10-01 DIAGNOSIS — E1142 Type 2 diabetes mellitus with diabetic polyneuropathy: Secondary | ICD-10-CM | POA: Diagnosis not present

## 2018-10-01 DIAGNOSIS — Z89412 Acquired absence of left great toe: Secondary | ICD-10-CM | POA: Diagnosis not present

## 2018-10-01 DIAGNOSIS — L02612 Cutaneous abscess of left foot: Secondary | ICD-10-CM | POA: Diagnosis not present

## 2018-10-01 LAB — BLOOD GAS, VENOUS
Acid-Base Excess: 4.2 mmol/L — ABNORMAL HIGH (ref 0.0–2.0)
Bicarbonate: 29.2 mmol/L — ABNORMAL HIGH (ref 20.0–28.0)
O2 Saturation: 55.1 %
Patient temperature: 37
pCO2, Ven: 44 mmHg (ref 44.0–60.0)
pH, Ven: 7.43 (ref 7.250–7.430)

## 2018-10-02 NOTE — Patient Outreach (Signed)
Transition of care:  Placed 2nd outreach call to patient who was not available.  PLAN: will call back in 3 business days.  Tomasa Rand, RN, BSN, CEN [facility] [employer] [phone]

## 2018-10-05 ENCOUNTER — Ambulatory Visit (INDEPENDENT_AMBULATORY_CARE_PROVIDER_SITE_OTHER): Payer: PPO | Admitting: Surgery

## 2018-10-05 ENCOUNTER — Encounter: Payer: Self-pay | Admitting: Surgery

## 2018-10-05 ENCOUNTER — Telehealth: Payer: Self-pay | Admitting: Surgery

## 2018-10-05 ENCOUNTER — Other Ambulatory Visit: Payer: Self-pay

## 2018-10-05 ENCOUNTER — Ambulatory Visit
Admission: RE | Admit: 2018-10-05 | Discharge: 2018-10-05 | Disposition: A | Discharge status: Home / Self Care | Payer: PPO | Source: Ambulatory Visit | Attending: Surgery | Admitting: Surgery

## 2018-10-05 VITALS — BP 180/90 | HR 75 | Temp 98.1°F | Resp 16 | Ht 72.0 in | Wt 240.0 lb

## 2018-10-05 DIAGNOSIS — K3533 Acute appendicitis with perforation and localized peritonitis, with abscess: Secondary | ICD-10-CM | POA: Insufficient documentation

## 2018-10-05 DIAGNOSIS — Z09 Encounter for follow-up examination after completed treatment for conditions other than malignant neoplasm: Secondary | ICD-10-CM | POA: Diagnosis not present

## 2018-10-05 DIAGNOSIS — K651 Peritoneal abscess: Secondary | ICD-10-CM | POA: Diagnosis not present

## 2018-10-05 IMAGING — CT CT ABDOMEN AND PELVIS WITHOUT CONTRAST
2 of 4 series · 15 of 46 positions shown, 17 images · non-contrast
Comparison: [DATE], [DATE]

CLINICAL DATA: History of prior appendectomy with postoperative
infected hematoma, follow-up drainage catheter from [DATE]

EXAM:
CT ABDOMEN AND PELVIS WITHOUT CONTRAST
TECHNIQUE: Multidetector CT imaging of the abdomen and pelvis was performed
following the standard protocol without IV contrast.

[Series 2: routine abdomen pelvis without · axial · non-contrast · 0.93mm/px · z∈[-1530,-1040]mm · 12 of 112 slices shown, 14 images (1 of 2)]
[im 9/112  soft-tissue]
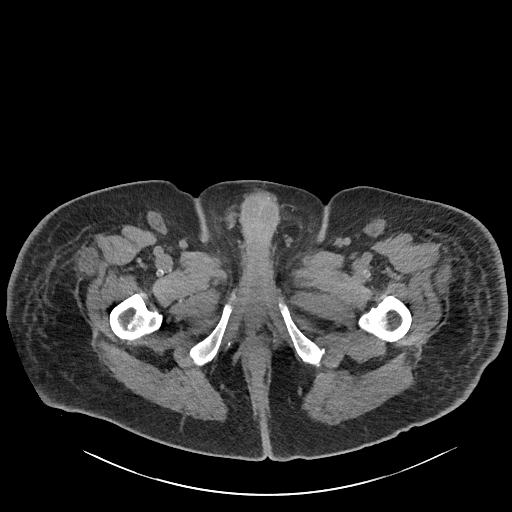
[im 9/112  bone]
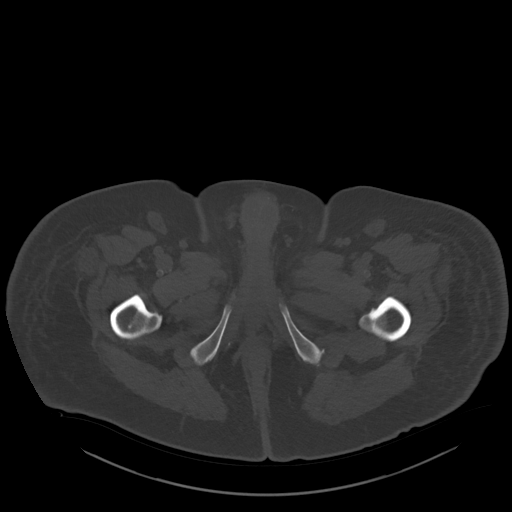
[im 18/112  soft-tissue]
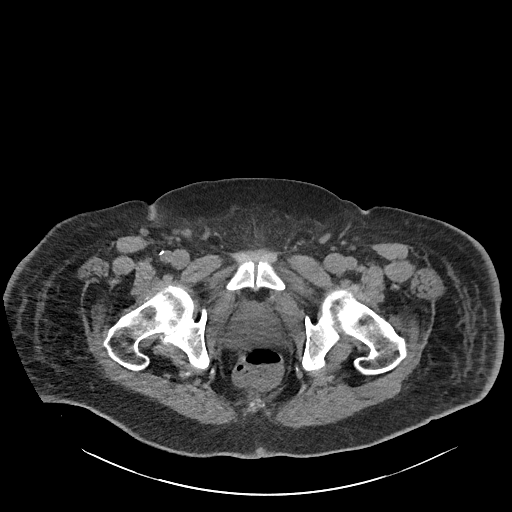
[im 27/112  soft-tissue]
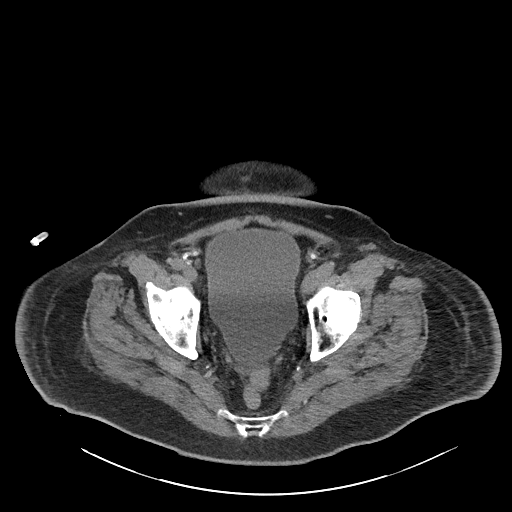
[im 36/112  soft-tissue]
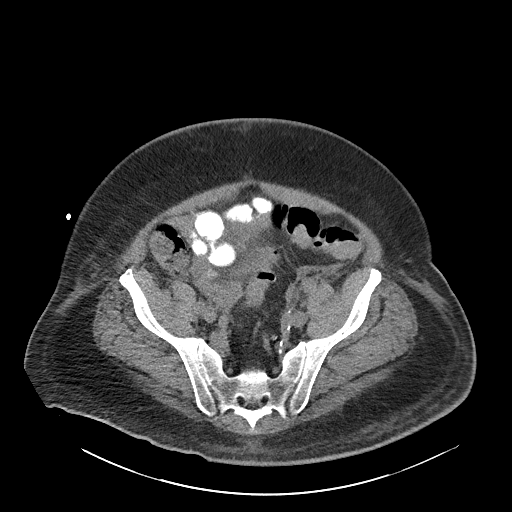
[im 45/112  soft-tissue]
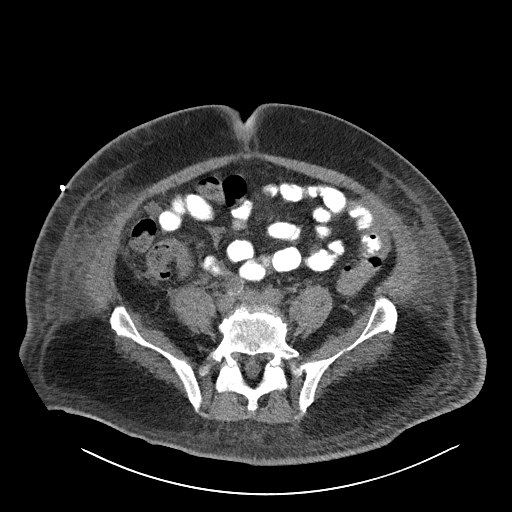
[im 54/112  soft-tissue]
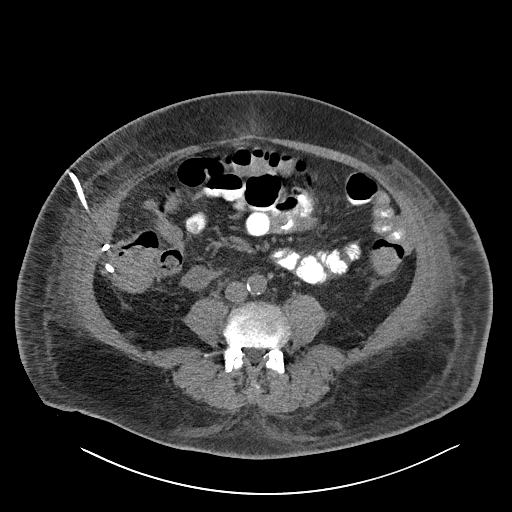
[im 63/112  soft-tissue]
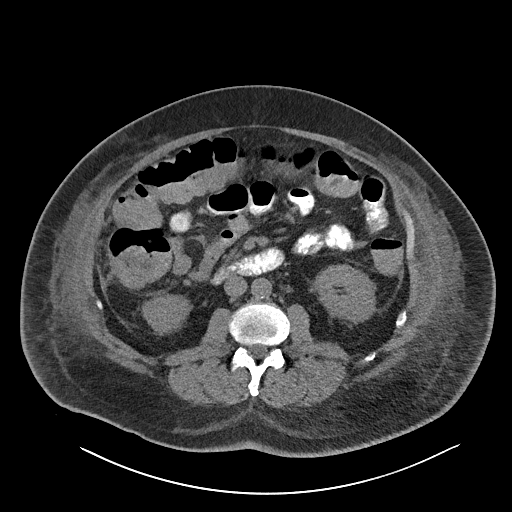
[im 72/112  soft-tissue]
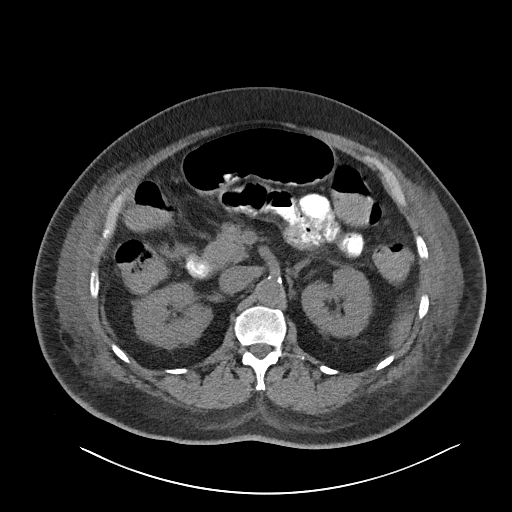
[im 80/112  soft-tissue]
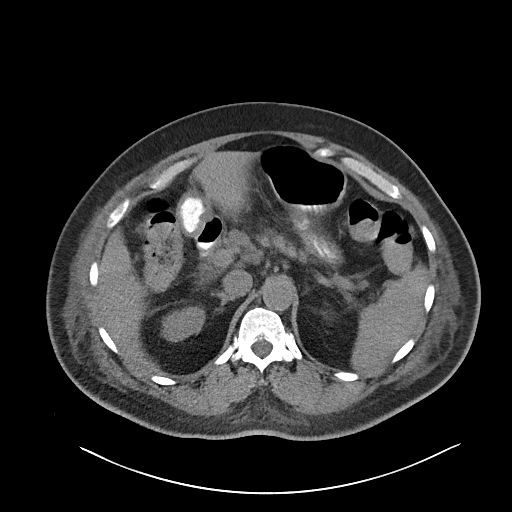
[im 80/112  bone]
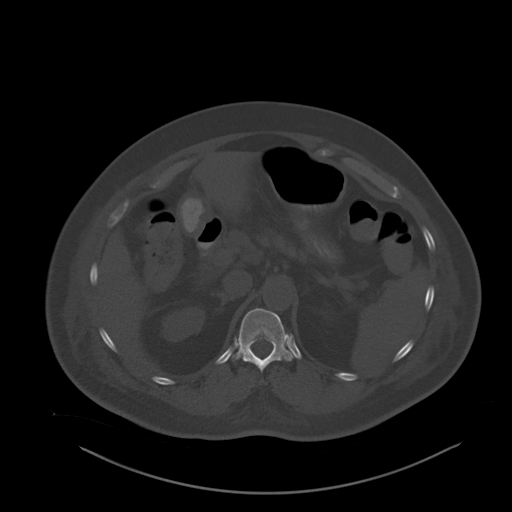
[im 89/112  soft-tissue]
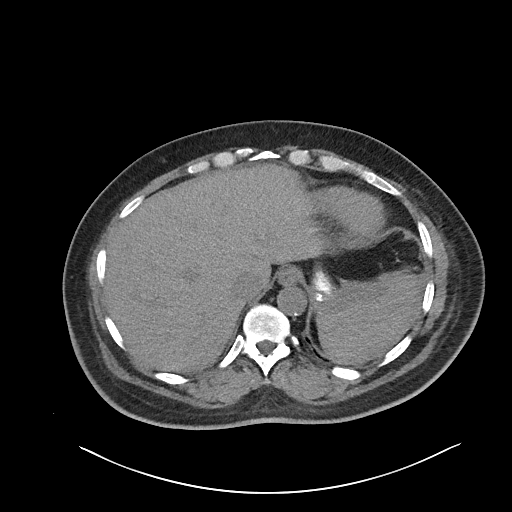
[im 98/112  soft-tissue]
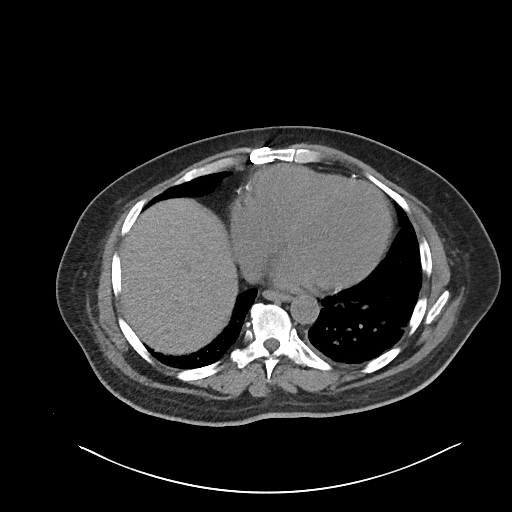
[im 107/112  soft-tissue]
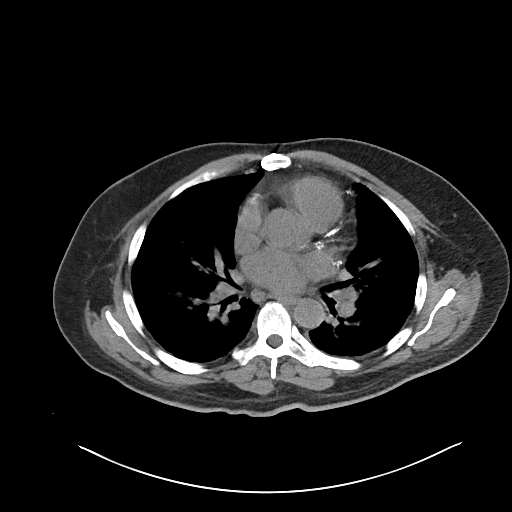

[Series 4: routine abdomen pelvis without · coronal · non-contrast · 0.92mm/px · 3 of 178 slices shown (2 of 2)]
[im 60/178  soft-tissue]
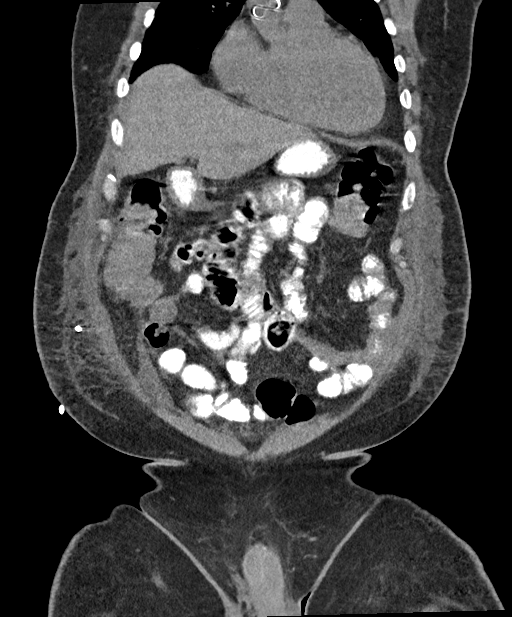
[im 79/178  soft-tissue]
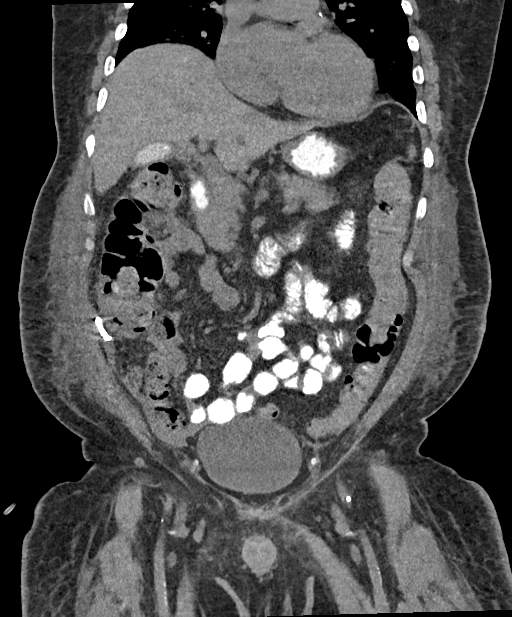
[im 99/178  soft-tissue]
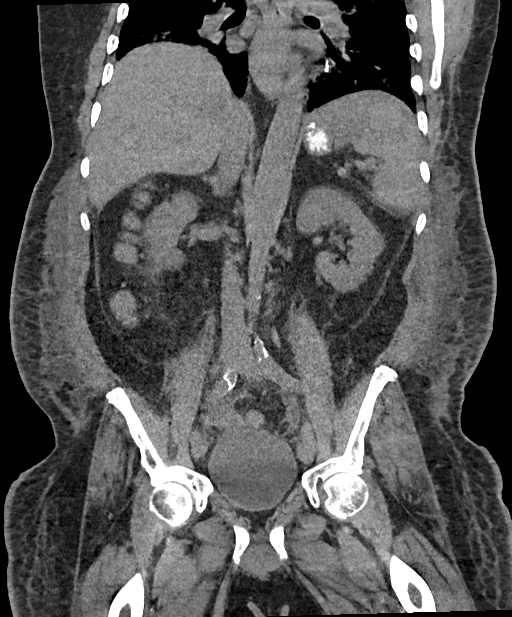

[15 of 46 positions shown; findings below may reference images not displayed]

FINDINGS: Lower chest: No acute abnormality.

Hepatobiliary: Liver is well visualized and within normal limits.
The gallbladder is decompressed with multiple stones within similar
to that seen on prior exams.

Pancreas: Unremarkable. No pancreatic ductal dilatation or
surrounding inflammatory changes.

Spleen: Spleen is well visualized and within normal limits. A small
amount of perisplenic fluid is again identified but decreased when
compared with the prior exam of [DATE].

Adrenals/Urinary Tract: Adrenal glands are within normal limits.
Kidneys are well visualize without renal calculi or obstructive
changes. The bladder is well distended.

Stomach/Bowel: There are changes consistent with prior appendectomy.
The postsurgical changes in the right lower quadrant are relatively
stable in appearance. A drainage catheter is noted adjacent to the
cecum consistent with the previously placed catheter. The previously
noted pericolonic fluid collection has resolved in the interval. No
obstructive or inflammatory changes are seen.

Vascular/Lymphatic: Aortic atherosclerosis. No enlarged abdominal or
pelvic lymph nodes.

Reproductive: Prostate is unremarkable.

Other: Minimal residual fluid is noted within the pelvis although
significantly improved from previous exams. No hernia is identified.
Postoperative changes are seen adjacent to the umbilicus consistent
with prior laparoscopic surgery. Mild subcutaneous edema is noted
within the lateral abdominal wall similar to that seen on prior
exams.

Musculoskeletal: Bilateral pars defects are noted at L5 with minimal
anterolisthesis. Multilevel degenerative changes in the lumbar spine
are noted.
IMPRESSION: Previously seen pericolonic hematoma in the right lower quadrant has
resolved following drainage.

Near complete resolution of free fluid within the pelvis and
surrounding the spleen. A small residual is noted along the anterior
margin of the spleen but aga[REDACTED]reased from the prior exam. No
findings to suggest superimposed infection are noted.

The remainder of the exam is stable from previous studies.

## 2018-10-05 NOTE — Patient Instructions (Addendum)
Return on 10-08-18 at 10:30 am to see Dr. Rosana Hoes.   Appointment with Dr. Dahlia Byes 10-12-18 at 11:15 am.   Labs ordered for today at [facility] (registration desk).  CT abdomen/pelvis (oral contrast only) ordered at [facility] for 10-05-18.

## 2018-10-05 NOTE — Telephone Encounter (Signed)
[place]  Called to inform about CT results, significant improvement. No drainable collection. No acute intra-abdominal pathology. Yolanda Bonine stated that Mr. Parcher had decreased Mental status and encourage to contact EMS or go to the ER. EMS came by. I did a follow up call and his BS was  37. Responded to IV dextrose. Encourage them to go to the ER for further w/u.

## 2018-10-05 NOTE — Progress Notes (Signed)
t

## 2018-10-05 NOTE — Progress Notes (Signed)
Outpatient Surgical Follow Up  10/05/2018  Phillip [name] is an 63 y.o. male.   Chief Complaint  Patient presents with  . Routine Post Op    HPI: s/p lap appy and infected hematoma/abscess s/p drain placement.  A number of comorbidities including chronic renal insufficiency, coronary artery disease and peripheral vascular disease, CHF status post CABG.  He is diabetic.  Severe hypertension. Last couple of days have talked to the family that is both the son and daughter present in the room so discussed in detail with his wife over the phone.  They tell me that over the last couple of days there has been some jerking movements and some low blood sugars.  His pressure now is climbing up and yesterday his systolic blood pressure was in the 200s. Continues to have lower extremity edema.  He takes p.o. no nausea no vomiting no fevers but has decreased appetite. He is Still weak. He has an appt w Nephrology next week Dr. Neta [name] JP draining about 68ml a day  Past Medical History:  Diagnosis Date  . Asthma   . CHF (congestive heart failure) ([place])   . Coronary artery disease   . Diabetes mellitus without complication ([place])   . Foot ulcer ([place])    left foot  . GERD (gastroesophageal reflux disease)   . Heart murmur   . Hypertension   . Kidney insufficiency   . Peripheral vascular disease [facility])     Past Surgical History:  Procedure Laterality Date  . AMPUTATION TOE Left 03/13/2018   Procedure: AMPUTATION TOE-MPJ;  Surgeon: Phillip [name], DPM;  Location: [facility] ORS;  Service: Podiatry;  Laterality: Left;  . ANGIOPLASTY    . CARDIAC CATHETERIZATION    . CARDIAC SURGERY     heart bypass (4)  . CORONARY ARTERY BYPASS GRAFT    . LAPAROSCOPIC APPENDECTOMY N/A 09/08/2018   Procedure: APPENDECTOMY LAPAROSCOPIC;  Surgeon: Phillip Husbands, MD;  Location: [facility] ORS;  Service: General;  Laterality: N/A;  . LOWER EXTREMITY ANGIOGRAPHY Left 03/02/2018   Procedure: LOWER EXTREMITY ANGIOGRAPHY;  Surgeon:  Phillip Huxley, MD;  Location: [facility] CV LAB;  Service: Cardiovascular;  Laterality: Left;  . ROTATOR CUFF REPAIR Left     Family History  Problem Relation Age of Onset  . Hyperlipidemia Mother   . Hypertension Mother   . Diabetes Mother   . Heart attack Brother   . Heart disease Brother     Social History:  reports that he quit smoking about 17 years ago. He has never used smokeless tobacco. He reports previous alcohol use. He reports previous drug use. Drug: Marijuana.  Allergies: No Known Allergies  Medications reviewed.    ROS Full ROS performed and is otherwise negative other than what is stated in HPI   BP (!) 180/90   Pulse 75   Temp 98.1 F (36.7 C) (Skin)   Resp 16   Ht 6' (1.829 m)   Wt 240 lb (108.9 kg)   SpO2 98%   BMI 32.55 kg/m   Physical Exam Vitals signs and nursing note reviewed. Exam conducted with a chaperone present.  Constitutional:      General: He is not in acute distress. Pulmonary:     Effort: Pulmonary effort is normal.     Breath sounds: No stridor.  Abdominal:     General: Abdomen is flat. There is no distension.     Palpations: There is no mass.     Tenderness: There is no abdominal  tenderness. There is no guarding or rebound.     Hernia: No hernia is present.     Comments: Drain in place w serous output. Wound healing well, Scant drainage from periumbilical area. Bandaid placed. No peritonitis or infection  Musculoskeletal:     Right lower leg: Edema present.     Left lower leg: Edema present.  Neurological:     General: No focal deficit present.     Mental Status: He is alert and oriented to person, place, and time.  Psychiatric:        Mood and Affect: Mood normal.        Behavior: Behavior normal.        Thought Content: Thought content normal.        Judgment: Judgment normal.    Assessment/Plan: 63 yo patient with multiple comorbidities.  Given the failure to thrive and fragility  I am going to repeat labs to  include a CMP, CBC as well as a CT scan of the abdomen and pelvis to evaluate for any other for possible undrained collections as well as kidney function. He will see one of my partners later this week and see me back again next Monday as well. HE is not in need for hospitalization at this time or any emergent surgical procedures. I did encourage to track his BP, sugars  and weight and discuss them w PCP and nephrologist. - Phillip [name]; Future - Comprehensive metabolic panel; Future - Magnesium; Future - Phosphorus; Future - CBC with Differential/Platelet; Future   Phillip Hamman, MD [facility] General Surgeon

## 2018-10-06 ENCOUNTER — Other Ambulatory Visit: Payer: Self-pay

## 2018-10-06 DIAGNOSIS — T8781 Dehiscence of amputation stump: Secondary | ICD-10-CM | POA: Diagnosis not present

## 2018-10-06 DIAGNOSIS — N184 Chronic kidney disease, stage 4 (severe): Secondary | ICD-10-CM | POA: Diagnosis not present

## 2018-10-06 DIAGNOSIS — S31109A Unspecified open wound of abdominal wall, unspecified quadrant without penetration into peritoneal cavity, initial encounter: Secondary | ICD-10-CM | POA: Diagnosis not present

## 2018-10-06 DIAGNOSIS — I130 Hypertensive heart and chronic kidney disease with heart failure and stage 1 through stage 4 chronic kidney disease, or unspecified chronic kidney disease: Secondary | ICD-10-CM | POA: Diagnosis not present

## 2018-10-06 DIAGNOSIS — R5383 Other fatigue: Secondary | ICD-10-CM | POA: Diagnosis not present

## 2018-10-06 DIAGNOSIS — Z794 Long term (current) use of insulin: Secondary | ICD-10-CM | POA: Diagnosis not present

## 2018-10-06 DIAGNOSIS — K651 Peritoneal abscess: Secondary | ICD-10-CM | POA: Diagnosis not present

## 2018-10-06 DIAGNOSIS — D631 Anemia in chronic kidney disease: Secondary | ICD-10-CM | POA: Diagnosis not present

## 2018-10-06 DIAGNOSIS — Z79899 Other long term (current) drug therapy: Secondary | ICD-10-CM | POA: Diagnosis not present

## 2018-10-06 DIAGNOSIS — I25118 Atherosclerotic heart disease of native coronary artery with other forms of angina pectoris: Secondary | ICD-10-CM | POA: Diagnosis not present

## 2018-10-06 DIAGNOSIS — Z4803 Encounter for change or removal of drains: Secondary | ICD-10-CM | POA: Diagnosis not present

## 2018-10-06 DIAGNOSIS — I5020 Unspecified systolic (congestive) heart failure: Secondary | ICD-10-CM | POA: Diagnosis not present

## 2018-10-06 DIAGNOSIS — R5381 Other malaise: Secondary | ICD-10-CM | POA: Diagnosis not present

## 2018-10-06 DIAGNOSIS — E1122 Type 2 diabetes mellitus with diabetic chronic kidney disease: Secondary | ICD-10-CM | POA: Diagnosis not present

## 2018-10-06 NOTE — Patient Outreach (Signed)
Transition of care:  Placed 3rd outreach call to patient and finally able to reach patient. Patient confirmed identity and ask that I speak with his wife  Phillip [name]. Explained to patient and wife purpose of call.   Spoke with [facility] who states that patient has been having a hard time with his DM. Reports since he was discharged home he has been having low CBG. Reports times where patient who have temors and just profound weakness.  Wife reports patient saw surgeon yesterday and was scheduled to have labs and a CT scan. Reports patient had CT scan and was too weak to have labs. Patient returned home and was unable to get out of car and EMS was called. Patient was found to have CBG of 37 and was given IV dextrose. Wife states that patient is not taking anything for this blood sugar. Denies insulin.   PLAN: reviewed medications with wife and noted that patient is taking medication for DM ( glipizide) reviewed this finding with wife and she states that was not the problem . I encouraged wife to take all medications to MD appointment today.   Again reviewed participation in program and wife states she is not sure. Reports being active with [facility] and nursing.  Reviewed with wife I would call patient back in 2 days to confirm interested in [facility] program . She agreed.    No care plan established at this time because of uncertainty of participating in program.   Tomasa Rand, RN, BSN, [facility] [employer] [phone]

## 2018-10-07 DIAGNOSIS — N179 Acute kidney failure, unspecified: Secondary | ICD-10-CM | POA: Insufficient documentation

## 2018-10-07 DIAGNOSIS — Z7984 Long term (current) use of oral hypoglycemic drugs: Secondary | ICD-10-CM | POA: Diagnosis not present

## 2018-10-07 DIAGNOSIS — R5381 Other malaise: Secondary | ICD-10-CM | POA: Diagnosis not present

## 2018-10-07 DIAGNOSIS — E1122 Type 2 diabetes mellitus with diabetic chronic kidney disease: Secondary | ICD-10-CM | POA: Diagnosis not present

## 2018-10-07 DIAGNOSIS — N184 Chronic kidney disease, stage 4 (severe): Secondary | ICD-10-CM | POA: Insufficient documentation

## 2018-10-07 DIAGNOSIS — Z794 Long term (current) use of insulin: Secondary | ICD-10-CM | POA: Diagnosis not present

## 2018-10-07 DIAGNOSIS — Z87891 Personal history of nicotine dependence: Secondary | ICD-10-CM | POA: Diagnosis not present

## 2018-10-07 DIAGNOSIS — I5020 Unspecified systolic (congestive) heart failure: Secondary | ICD-10-CM | POA: Diagnosis not present

## 2018-10-07 DIAGNOSIS — D638 Anemia in other chronic diseases classified elsewhere: Secondary | ICD-10-CM | POA: Diagnosis not present

## 2018-10-07 DIAGNOSIS — I129 Hypertensive chronic kidney disease with stage 1 through stage 4 chronic kidney disease, or unspecified chronic kidney disease: Secondary | ICD-10-CM | POA: Diagnosis not present

## 2018-10-07 DIAGNOSIS — R601 Generalized edema: Secondary | ICD-10-CM | POA: Diagnosis not present

## 2018-10-07 DIAGNOSIS — R5383 Other fatigue: Secondary | ICD-10-CM | POA: Diagnosis not present

## 2018-10-07 HISTORY — DX: Acute kidney failure, unspecified: N17.9

## 2018-10-07 HISTORY — DX: Chronic kidney disease, stage 4 (severe): N18.4

## 2018-10-08 ENCOUNTER — Other Ambulatory Visit: Payer: Self-pay

## 2018-10-08 ENCOUNTER — Other Ambulatory Visit
Admission: RE | Admit: 2018-10-08 | Discharge: 2018-10-08 | Disposition: A | Discharge status: Home / Self Care | Payer: PPO | Source: Ambulatory Visit | Attending: Surgery | Admitting: Surgery

## 2018-10-08 ENCOUNTER — Encounter: Payer: Self-pay | Admitting: Surgery

## 2018-10-08 ENCOUNTER — Ambulatory Visit (INDEPENDENT_AMBULATORY_CARE_PROVIDER_SITE_OTHER): Payer: PPO | Admitting: Surgery

## 2018-10-08 VITALS — BP 165/95 | HR 73 | Temp 97.9°F | Ht 71.0 in | Wt 240.0 lb

## 2018-10-08 DIAGNOSIS — K3533 Acute appendicitis with perforation and localized peritonitis, with abscess: Secondary | ICD-10-CM | POA: Diagnosis not present

## 2018-10-08 DIAGNOSIS — Z4889 Encounter for other specified surgical aftercare: Secondary | ICD-10-CM

## 2018-10-08 LAB — COMPREHENSIVE METABOLIC PANEL
ALT: 9 U/L (ref 0–44)
AST: 17 U/L (ref 15–41)
Albumin: 2.6 g/dL — ABNORMAL LOW (ref 3.5–5.0)
Alkaline Phosphatase: 49 U/L (ref 38–126)
Anion gap: 6 (ref 5–15)
BUN: 37 mg/dL — ABNORMAL HIGH (ref 8–23)
CO2: 24 mmol/L (ref 22–32)
Calcium: 8 mg/dL — ABNORMAL LOW (ref 8.9–10.3)
Chloride: 104 mmol/L (ref 98–111)
Creatinine, Ser: 2.77 mg/dL — ABNORMAL HIGH (ref 0.61–1.24)
GFR calc Af Amer: 27 mL/min — ABNORMAL LOW (ref >60)
GFR calc non Af Amer: 23 mL/min — ABNORMAL LOW (ref >60)
Glucose, Bld: 219 mg/dL — ABNORMAL HIGH (ref 70–99)
Potassium: 5.1 mmol/L (ref 3.5–5.1)
Sodium: 134 mmol/L — ABNORMAL LOW (ref 135–145)
Total Bilirubin: 0.5 mg/dL (ref 0.3–1.2)
Total Protein: 6.9 g/dL (ref 6.5–8.1)

## 2018-10-08 LAB — CBC WITH DIFFERENTIAL/PLATELET
Abs Immature Granulocytes: 0.04 10*3/uL (ref 0.00–0.07)
Basophils Absolute: 0 10*3/uL (ref 0.0–0.1)
Basophils Relative: 0 %
Eosinophils Absolute: 0.3 10*3/uL (ref 0.0–0.5)
Eosinophils Relative: 3 %
HCT: 26.7 % — ABNORMAL LOW (ref 39.0–52.0)
Hemoglobin: 8.2 g/dL — ABNORMAL LOW (ref 13.0–17.0)
Immature Granulocytes: 1 %
Lymphocytes Relative: 10 %
Lymphs Abs: 0.8 10*3/uL (ref 0.7–4.0)
MCH: 27.4 pg (ref 26.0–34.0)
MCHC: 30.7 g/dL (ref 30.0–36.0)
MCV: 89.3 fL (ref 80.0–100.0)
Monocytes Absolute: 0.7 10*3/uL (ref 0.1–1.0)
Monocytes Relative: 9 %
Neutro Abs: 5.9 10*3/uL (ref 1.7–7.7)
Neutrophils Relative %: 77 %
Platelets: 198 10*3/uL (ref 150–400)
RBC: 2.99 MIL/uL — ABNORMAL LOW (ref 4.22–5.81)
RDW: 17.3 % — ABNORMAL HIGH (ref 11.5–15.5)
WBC: 7.7 10*3/uL (ref 4.0–10.5)
nRBC: 0 % (ref 0.0–0.2)

## 2018-10-08 LAB — MAGNESIUM: Magnesium: 2.1 mg/dL (ref 1.7–2.4)

## 2018-10-08 LAB — PHOSPHORUS: Phosphorus: 3.7 mg/dL (ref 2.5–4.6)

## 2018-10-08 NOTE — Progress Notes (Signed)
Surgical Clinic Progress/Follow-up Note   HPI:  64 y.o. Male presents to clinic for subsequent post-op follow-up 1 month s/p laparoscopic appendectomy (Pabon, 09/08/2018) for acute appendicitis, complicated by infected hemoperitoneum requiring drainage associated with ongoing aspirin and Plavix at time of surgery. Patient reports he has been feeling better with improved abdominal pain, energy levels, and he has been eating and drinking well with loose BM's, but not watery or explosive. He denies fever/chills, N/V, CP, or SOB.   He reports a single episode of altered mental status and syncope while in his grandson's car attributed to hypoglycemia 3 days ago, for which EMS was called and resolved with IV dextrose. Persistent B/L lower extremity edema is being managed by his nephrologist. He and his grandsons describe ~10 mL of watery drainage from RLQ bulb suction drain. All of the above were verified with patient's 2 grandsons who accompany him today.  Review of Systems:  Constitutional: denies fever/chills  Respiratory: denies shortness of breath, wheezing  Cardiovascular: denies chest pain, palpitations  Gastrointestinal: abdominal pain, N/V, and bowel function as per interval history Skin: Denies any other rashes or skin discolorations except as per interval history  Vital Signs:  BP (!) 165/95   Pulse 73   Temp 97.9 F (36.6 C) (Skin)   Ht 5\' 11"  (1.803 m)   Wt 240 lb (108.9 kg)   SpO2 94%   BMI 33.47 kg/m    Physical Exam:  Constitutional:  -- Normal body habitus  -- Awake, alert, and oriented x3  Pulmonary:  -- No crackles -- Equal breath sounds bilaterally -- Breathing non-labored at rest Cardiovascular:  -- S1, S2 present  -- No pericardial rubs  Gastrointestinal:  -- Soft and non-distended, non-tender to palpation, no guarding/rebound tenderness -- Post-surgical incisions all well-approximated without any peri-incisional erythema or drainage -- RLQ bulb suction drain  well-secured with <20 mL clear fluid in bulb and tubing -- No abdominal masses appreciated, pulsatile or otherwise  Musculoskeletal / Integumentary:  -- Wounds or skin discoloration: None appreciated except post-surgical incisions as described above (GI) -- Extremities: B/L UE and LE FROM, hands and feet warm, B/L 2+ edema   Imaging: No new pertinent imaging available for review   Assessment:  63 y.o. yo Male with a problem list including...  Patient Active Problem List   Diagnosis Date Noted  . Abscess of appendix 09/22/2018  . Appendicitis 09/08/2018  . Foot ulcer ([place]) 04/24/2018  . Diabetes mellitus without complication ([place]) 31/54/0086  . Hypertension 02/24/2018  . CHF (congestive heart failure) ([place]) 02/24/2018  . Atherosclerosis of native arteries of the extremities with ulceration ([place]) 02/24/2018    presents to clinic for subsequent post-op follow-up 1 month s/p laparoscopic appendectomy (Pabon, 09/08/2018) for acute appendicitis, complicated by infected hemoperitoneum requiring drainage associated with aspirin and Plavix at time of surgery.  Plan:   - RLQ peritoneal drain removed             - heart- and renal- healthy diet as tolerated  - medical management of comorbidities as per PMD and nephrologist             - continue home physical therapy and gradually resume all activities as able             - apply sunblock particularly to incisions with sun exposure to reduce pigmentation of scars             - return to clinic for appointment with Dr. Dahlia Byes next Monday as  scheduled  - instructed to call office if any questions or concerns  All of the above recommendations were discussed with the patient and patient's family, and all of patient's and family's questions were answered to their expressed satisfaction.  -- Marilynne Drivers Rosana Hoes, MD, [place]: [facility] - Partnering for exceptional care. Office: [phone]

## 2018-10-08 NOTE — Patient Instructions (Addendum)
You will need to keep a dressing over the drain site until it heals completely.    GENERAL POST-OPERATIVE PATIENT INSTRUCTIONS   WOUND CARE INSTRUCTIONS:  Keep a dry clean dressing on the wound if there is drainage. The initial bandage may be removed after 24 hours.  Once the wound has quit draining you may leave it open to air.  If clothing rubs against the wound or causes irritation and the wound is not draining you may cover it with a dry dressing during the daytime.  Try to keep the wound dry and avoid ointments on the wound unless directed to do so.  If the wound becomes bright red and painful or starts to drain infected material that is not clear, please contact your physician immediately.  If the wound is mildly pink and has a thick firm ridge underneath it, this is normal, and is referred to as a healing ridge.  This will resolve over the next 4-6 weeks.  BATHING: You may shower if you have been informed of this by your surgeon. However, Please do not submerge in a tub, hot tub, or pool until incisions are completely sealed or have been told by your surgeon that you may do so.  DIET:  You may eat any foods that you can tolerate.  It is a good idea to eat a high fiber diet and take in plenty of fluids to prevent constipation.  If you do become constipated you may want to take a mild laxative or take ducolax tablets on a daily basis until your bowel habits are regular.  Constipation can be very uncomfortable, along with straining, after recent surgery.  ACTIVITY:  You are encouraged to cough and deep breath or use your incentive spirometer if you were given one, every 15-30 minutes when awake.  This will help prevent respiratory complications and low grade fevers post-operatively if you had a general anesthetic.  You may want to hug a pillow when coughing and sneezing to add additional support to the surgical area, if you had abdominal or chest surgery, which will decrease pain during these  times.  You are encouraged to walk and engage in light activity for the next two weeks.  You should not lift more than 20 pounds, until 10/22/2018 as it could put you at increased risk for complications.  Twenty pounds is roughly equivalent to a plastic bag of groceries. At that time- Listen to your body when lifting, if you have pain when lifting, stop and then try again in a few days. Soreness after doing exercises or activities of daily living is normal as you get back in to your normal routine.  MEDICATIONS:  Try to take narcotic medications and anti-inflammatory medications, such as tylenol, ibuprofen, naprosyn, etc., with food.  This will minimize stomach upset from the medication.  Should you develop nausea and vomiting from the pain medication, or develop a rash, please discontinue the medication and contact your physician.  You should not drive, make important decisions, or operate machinery when taking narcotic pain medication.  SUNBLOCK Use sun block to incision area over the next year if this area will be exposed to sun. This helps decrease scarring and will allow you avoid a permanent darkened area over your incision.  QUESTIONS:  Please feel free to call our office if you have any questions, and we will be glad to assist you. [phone]

## 2018-10-08 NOTE — Patient Outreach (Signed)
Transition of care/ follow up telephone call from earlier in the week.  Placed call to wife and patient to determine interest in [facility] program. Wife said ..." thanks Estill Bamberg for realizing what was wrong with his medication"  Reports primary MD decrease glipizide due to hypoglycemia.  Reviewed with wife DM education and normal fasting range.  Wife states she is keeping her eye on CBGs.   Wife accepted program.  Reviewed with wife I would mail an new patient packet and reviewed need of consent to be completed and returned to the office.   PLAN: mail out packet. Follow up with patient and wife in 1 week.  Goals set to avoid a readmission and close CBG monitoring.  Tomasa Rand, RN, BSN, [facility] [employer] [phone]

## 2018-10-12 ENCOUNTER — Other Ambulatory Visit: Payer: Self-pay

## 2018-10-12 ENCOUNTER — Encounter: Payer: Self-pay | Admitting: Surgery

## 2018-10-12 ENCOUNTER — Ambulatory Visit (INDEPENDENT_AMBULATORY_CARE_PROVIDER_SITE_OTHER): Payer: PPO | Admitting: Surgery

## 2018-10-12 VITALS — BP 143/74 | HR 67 | Temp 97.5°F | Resp 15

## 2018-10-12 DIAGNOSIS — Z09 Encounter for follow-up examination after completed treatment for conditions other than malignant neoplasm: Secondary | ICD-10-CM

## 2018-10-12 NOTE — Patient Instructions (Signed)
Follow up in one month.

## 2018-10-12 NOTE — Progress Notes (Signed)
Outpatient Surgical Follow Up  10/12/2018  Phillip [name] is an 63 y.o. male.   Chief Complaint  Patient presents with  . Routine Post Op    HPI: s/p lap appy and abscess s/p perc drainage. Doing significantly better. Drain removed. CT personally reviewed, resolution of collection. Hypoglycemic episodes resolved. Creat trending down. BP better controlled. Persistent anasarca  Past Medical History:  Diagnosis Date  . Asthma   . CHF (congestive heart failure) ([place])   . Coronary artery disease   . Diabetes mellitus without complication ([place])   . Foot ulcer ([place])    left foot  . GERD (gastroesophageal reflux disease)   . Heart murmur   . Hypertension   . Kidney insufficiency   . Peripheral vascular disease [facility])     Past Surgical History:  Procedure Laterality Date  . AMPUTATION TOE Left 03/13/2018   Procedure: AMPUTATION TOE-MPJ;  Surgeon: Samara Deist, DPM;  Location: [facility] ORS;  Service: Podiatry;  Laterality: Left;  . ANGIOPLASTY    . CARDIAC CATHETERIZATION    . CARDIAC SURGERY     heart bypass (4)  . CORONARY ARTERY BYPASS GRAFT    . LAPAROSCOPIC APPENDECTOMY N/A 09/08/2018   Procedure: APPENDECTOMY LAPAROSCOPIC;  Surgeon: Jules Husbands, MD;  Location: [facility] ORS;  Service: General;  Laterality: N/A;  . LOWER EXTREMITY ANGIOGRAPHY Left 03/02/2018   Procedure: LOWER EXTREMITY ANGIOGRAPHY;  Surgeon: Algernon Huxley, MD;  Location: [place] CV LAB;  Service: Cardiovascular;  Laterality: Left;  . ROTATOR CUFF REPAIR Left     Family History  Problem Relation Age of Onset  . Hyperlipidemia Mother   . Hypertension Mother   . Diabetes Mother   . Heart attack Brother   . Heart disease Brother     Social History:  reports that he quit smoking about 17 years ago. He has never used smokeless tobacco. He reports previous alcohol use. He reports previous drug use. Drug: Marijuana.  Allergies: No Known Allergies  Medications reviewed.    ROS Full ROS performed and is  otherwise negative other than what is stated in HPI   BP (!) 143/74   Pulse 67   Temp (!) 97.5 F (36.4 C)   Resp 15   SpO2 97%   Physical Exam  Chronically ill, debilitated Abd: soft, nt, wounds healing well, no infection or peritonitis  Assessment/Plan: Doing well Main issue is deconditioning, renal failure and hypoglycemia episodes F/U PCP RTC 4 week No surgical issues at this time   Caroleen Hamman, MD [place] Surgeon

## 2018-10-13 NOTE — Discharge Summary (Signed)
Physician Discharge Summary  Patient ID: Phillip [name] MRN: [id number] DOB/AGE: [id number] y.o.  Admit date: 09/22/2018 Discharge date: 09/26/2018  Admission Diagnoses:  Discharge Diagnoses:  Active Problems:   Abscess of appendix   Discharged Condition: good (much improved)  Hospital Course: Patient is a 63 year old male recently admitted to [facility] for acute appendicitis while on aspirin and Plavix, for which he underwent laparoscopic appendectomy (Pabon, 3/0/1601), complicated by AKI superimposed on CKD, post-surgical ileus with subsequent loose BM's, hemoperitoneum demonstrated on multiple post-surgical abdominal CT's with unresolved leukocytosis of otherwise unclear etiology, and prolonged hospital admission. Despite persistent leukocytosis, patient's condition during his prior admission had stabilized, and Dr. Dahlia Byes instructed surgical PA to discharge patient home in the interest of treating the patient rather than patient's labs. Unfortunately, patient returned to [facility] ED the following day with fever and altered mental status. Image-guided drainage of infected peritoneal hematoma (abscess) was performed after which patient's condition promptly improved, including complete resolution of his leukocytosis, and advancement of patient's diet and ambulation were well-tolerated with home PT advised. Patient requested discharge home, and discharge planning was discussed with patient's wife (via phone) and initiated accordingly.  Consults: Hospitalist  Significant Diagnostic Studies: labs: WBC - 8.2  Treatments: procedures: percutaneous drainage catheter placement  Discharge Exam: Blood pressure (!) 162/78, pulse 78, temperature 98.4 F (36.9 C), temperature source Oral, resp. rate 20, height 6\' 3"  (1.905 m), weight 108.9 kg, SpO2 100 %. General appearance: alert, cooperative and no distress GI: abdomen soft and non-distended with improved lower abdominal echymosis, peritoneal drain  well-secured with serosanguinous fluid in bulb and tubing, and post-surgical wounds well-approximated without surrounding erythema or drainage  Disposition:    Allergies as of 09/26/2018   No Known Allergies     Medication List    STOP taking these medications   amoxicillin-clavulanate 875-125 MG tablet Commonly known as:  Augmentin     TAKE these medications   acetaminophen 500 MG tablet Commonly known as:  TYLENOL Take 1,000 mg by mouth every 6 (six) hours as needed for moderate pain or headache.   aspirin 81 MG tablet Take 81 mg by mouth daily.   carvedilol 6.25 MG tablet Commonly known as:  COREG Take 6.25 mg by mouth 2 (two) times daily with a meal.   cloNIDine 0.3 MG tablet Commonly known as:  CATAPRES Take 0.3 mg by mouth 2 (two) times daily.   clopidogrel 75 MG tablet Commonly known as:  Plavix Take 1 tablet (75 mg total) by mouth daily.   gabapentin 600 MG tablet Commonly known as:  NEURONTIN Take 600 mg by mouth 2 (two) times daily.   glipiZIDE 10 MG 24 hr tablet Commonly known as:  GLUCOTROL XL Take 10 mg by mouth daily with breakfast.   isosorbide mononitrate 120 MG 24 hr tablet Commonly known as:  IMDUR Take 120 mg by mouth daily.   Levemir 100 UNIT/ML injection Generic drug:  insulin detemir Inject 40-80 Units into the skin at bedtime. Inject 40 units subcutaneously every morning and 80 units every evening   lovastatin 40 MG tablet Commonly known as:  MEVACOR Take 40 mg by mouth at bedtime.   nitroGLYCERIN 0.4 MG SL tablet Commonly known as:  NITROSTAT Place 0.4 mg under the tongue every 5 (five) minutes as needed for chest pain.   oxyCODONE 5 MG immediate release tablet Commonly known as:  Oxy IR/ROXICODONE Take 1-2 tablets (5-10 mg total) by mouth every 4 (four) hours as needed for  moderate pain.     ASK your doctor about these medications   ciprofloxacin 500 MG tablet Commonly known as:  CIPRO Take 1 tablet (500 mg total) by mouth 2  (two) times daily for 10 days. Ask about: Should I take this medication?   metroNIDAZOLE 500 MG tablet Commonly known as:  Flagyl Take 1 tablet (500 mg total) by mouth 3 (three) times daily for 10 days. Ask about: Should I take this medication?      Follow-up Information    Pabon, [place] F, MD. Schedule an appointment as soon as possible for a visit in 1 week(s).   Specialty:  General Surgery Contact information: [address] [place] [address] [phone]           Signed: Vickie Epley 10/13/2018, 8:38 PM

## 2018-10-15 ENCOUNTER — Other Ambulatory Visit: Payer: Self-pay

## 2018-10-15 NOTE — Patient Outreach (Signed)
Transition of care:  Placed call to patient and spoke with wife.  Wife reports patient is some better this week. Report drain removed by surgeon. Reports CBG range 200's with reduced dose of glipizide and no insulin.  Wife reports continued swelling in legs.  Wife reports patient no willing to weigh. Wife reports patient follows low salt diet and fluid restriction.  Wife reports patient know he needs to weigh. Reports the PT reminds patient twice a week of importance of daily weights.  Wife reports normal bowel movement .  Walking with walker.   Wife states patient needs help with dressing due to recent abdominal surgery.  PLAN: follow up in 1 week to complete all assessments.  Reviewed with wife the importance of CHF management and when to call MD.  Reviewed importance of taking all medications as prescribed.   Tomasa Rand, RN, BSN, CEN [facility] [employer] [phone]

## 2018-10-20 ENCOUNTER — Emergency Department
Admission: EM | Admit: 2018-10-20 | Discharge: 2018-10-20 | Disposition: A | Discharge status: Home / Self Care | Payer: PPO | Attending: Emergency Medicine | Admitting: Emergency Medicine

## 2018-10-20 ENCOUNTER — Encounter: Payer: Self-pay | Admitting: Emergency Medicine

## 2018-10-20 ENCOUNTER — Other Ambulatory Visit: Payer: Self-pay

## 2018-10-20 ENCOUNTER — Emergency Department: Payer: PPO

## 2018-10-20 DIAGNOSIS — I110 Hypertensive heart disease with heart failure: Secondary | ICD-10-CM | POA: Diagnosis not present

## 2018-10-20 DIAGNOSIS — R609 Edema, unspecified: Secondary | ICD-10-CM | POA: Diagnosis not present

## 2018-10-20 DIAGNOSIS — I2510 Atherosclerotic heart disease of native coronary artery without angina pectoris: Secondary | ICD-10-CM | POA: Diagnosis not present

## 2018-10-20 DIAGNOSIS — Z7901 Long term (current) use of anticoagulants: Secondary | ICD-10-CM | POA: Insufficient documentation

## 2018-10-20 DIAGNOSIS — Z20828 Contact with and (suspected) exposure to other viral communicable diseases: Secondary | ICD-10-CM | POA: Insufficient documentation

## 2018-10-20 DIAGNOSIS — Z89422 Acquired absence of other left toe(s): Secondary | ICD-10-CM | POA: Diagnosis not present

## 2018-10-20 DIAGNOSIS — Z79899 Other long term (current) drug therapy: Secondary | ICD-10-CM | POA: Insufficient documentation

## 2018-10-20 DIAGNOSIS — R600 Localized edema: Secondary | ICD-10-CM | POA: Diagnosis not present

## 2018-10-20 DIAGNOSIS — Z87891 Personal history of nicotine dependence: Secondary | ICD-10-CM | POA: Insufficient documentation

## 2018-10-20 DIAGNOSIS — I509 Heart failure, unspecified: Secondary | ICD-10-CM | POA: Insufficient documentation

## 2018-10-20 DIAGNOSIS — E119 Type 2 diabetes mellitus without complications: Secondary | ICD-10-CM | POA: Diagnosis not present

## 2018-10-20 DIAGNOSIS — Z7982 Long term (current) use of aspirin: Secondary | ICD-10-CM | POA: Insufficient documentation

## 2018-10-20 DIAGNOSIS — Z7984 Long term (current) use of oral hypoglycemic drugs: Secondary | ICD-10-CM | POA: Diagnosis not present

## 2018-10-20 DIAGNOSIS — R0602 Shortness of breath: Secondary | ICD-10-CM | POA: Diagnosis not present

## 2018-10-20 LAB — URINALYSIS, COMPLETE (UACMP) WITH MICROSCOPIC
Bacteria, UA: NONE SEEN
Bilirubin Urine: NEGATIVE
Glucose, UA: NEGATIVE mg/dL
Ketones, ur: NEGATIVE mg/dL
Nitrite: NEGATIVE
Protein, ur: 30 mg/dL — AB
Specific Gravity, Urine: 1.009 (ref 1.005–1.030)
pH: 6 (ref 5.0–8.0)

## 2018-10-20 LAB — CBC WITH DIFFERENTIAL/PLATELET
Abs Immature Granulocytes: 0.03 10*3/uL (ref 0.00–0.07)
Basophils Absolute: 0 10*3/uL (ref 0.0–0.1)
Basophils Relative: 1 %
Eosinophils Absolute: 0.3 10*3/uL (ref 0.0–0.5)
Eosinophils Relative: 5 %
HCT: 30.5 % — ABNORMAL LOW (ref 39.0–52.0)
Hemoglobin: 9.5 g/dL — ABNORMAL LOW (ref 13.0–17.0)
Immature Granulocytes: 1 %
Lymphocytes Relative: 18 %
Lymphs Abs: 0.9 10*3/uL (ref 0.7–4.0)
MCH: 27.9 pg (ref 26.0–34.0)
MCHC: 31.1 g/dL (ref 30.0–36.0)
MCV: 89.4 fL (ref 80.0–100.0)
Monocytes Absolute: 0.3 10*3/uL (ref 0.1–1.0)
Monocytes Relative: 6 %
Neutro Abs: 3.7 10*3/uL (ref 1.7–7.7)
Neutrophils Relative %: 69 %
Platelets: 148 10*3/uL — ABNORMAL LOW (ref 150–400)
RBC: 3.41 MIL/uL — ABNORMAL LOW (ref 4.22–5.81)
RDW: 17.2 % — ABNORMAL HIGH (ref 11.5–15.5)
WBC: 5.4 10*3/uL (ref 4.0–10.5)
nRBC: 0 % (ref 0.0–0.2)

## 2018-10-20 LAB — COMPREHENSIVE METABOLIC PANEL
ALT: 10 U/L (ref 0–44)
AST: 16 U/L (ref 15–41)
Albumin: 3 g/dL — ABNORMAL LOW (ref 3.5–5.0)
Alkaline Phosphatase: 68 U/L (ref 38–126)
Anion gap: 10 (ref 5–15)
BUN: 36 mg/dL — ABNORMAL HIGH (ref 8–23)
CO2: 22 mmol/L (ref 22–32)
Calcium: 8.1 mg/dL — ABNORMAL LOW (ref 8.9–10.3)
Chloride: 106 mmol/L (ref 98–111)
Creatinine, Ser: 1.86 mg/dL — ABNORMAL HIGH (ref 0.61–1.24)
GFR calc Af Amer: 44 mL/min — ABNORMAL LOW (ref >60)
GFR calc non Af Amer: 38 mL/min — ABNORMAL LOW (ref >60)
Glucose, Bld: 155 mg/dL — ABNORMAL HIGH (ref 70–99)
Potassium: 4.2 mmol/L (ref 3.5–5.1)
Sodium: 138 mmol/L (ref 135–145)
Total Bilirubin: 0.6 mg/dL (ref 0.3–1.2)
Total Protein: 7.2 g/dL (ref 6.5–8.1)

## 2018-10-20 LAB — SARS CORONAVIRUS 2 BY RT PCR (HOSPITAL ORDER, PERFORMED IN ~~LOC~~ HOSPITAL LAB): SARS Coronavirus 2: NEGATIVE

## 2018-10-20 LAB — BRAIN NATRIURETIC PEPTIDE: B Natriuretic Peptide: 1016 pg/mL — ABNORMAL HIGH (ref 0.0–100.0)

## 2018-10-20 LAB — TROPONIN I: Troponin I: <0.03 ng/mL (ref <0.03)

## 2018-10-20 IMAGING — DX PORTABLE CHEST - 1 VIEW
1 series · 1 of 1 positions shown · non-contrast
Comparison: [DATE]

CLINICAL DATA: Lower extremity edema.  Shortness of breath.

EXAM:
PORTABLE CHEST 1 VIEW

[chest ap]
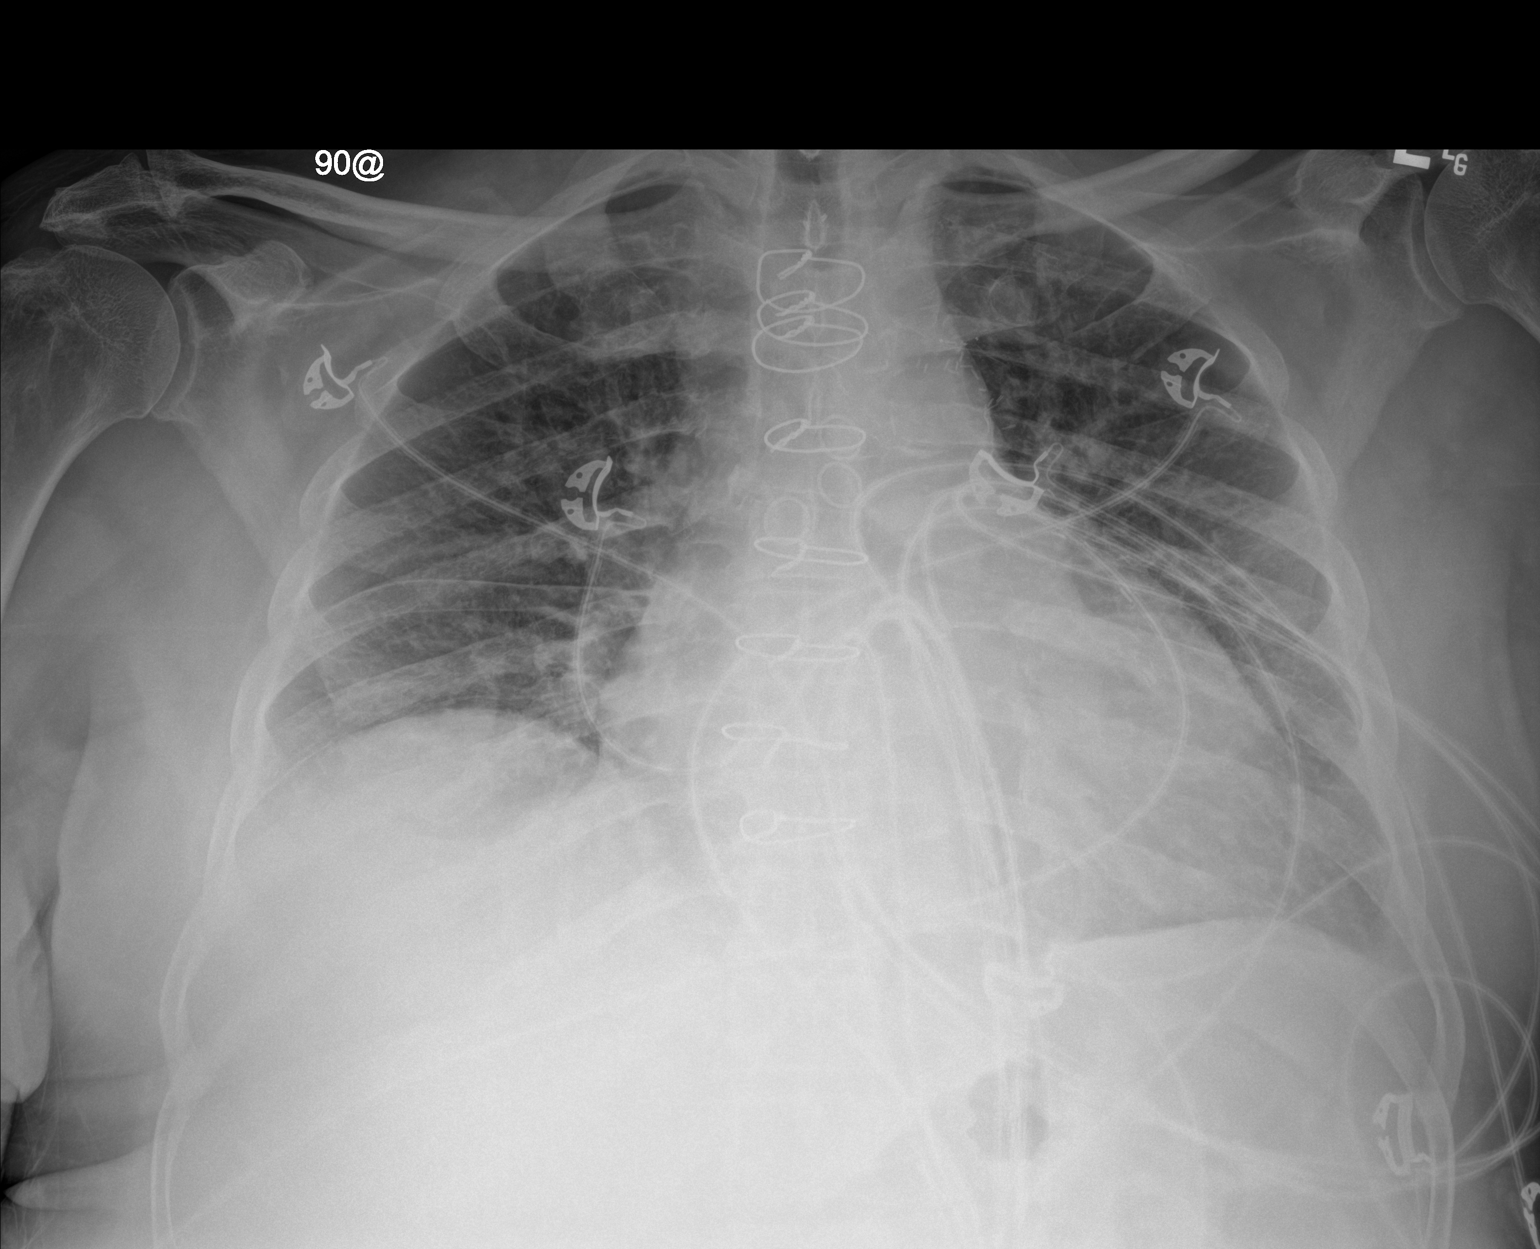

[1 of 1 positions shown; findings below may reference images not displayed]

FINDINGS: No edema or consolidation. Heart is mildly enlarged with pulmonary
vascularity normal. No adenopathy. Patient is status post coronary
artery bypass grafting. There is degenerative change in each
shoulder.
IMPRESSION: Mild cardiomegaly. Status post coronary artery bypass grafting. No
edema or consolidation.

## 2018-10-20 MED ORDER — FUROSEMIDE 10 MG/ML IJ SOLN
60.0000 mg | Freq: Once | INTRAMUSCULAR | Status: AC
  Administered 2018-10-20: 60 mg via INTRAVENOUS
  Filled 2018-10-20: qty 8

## 2018-10-20 NOTE — ED Notes (Addendum)
This RN to bedside to silence monitor/alarm. Pt throwing rare multifocal PVCs. Pt denies any needs but to have alarm silenced.

## 2018-10-20 NOTE — Discharge Instructions (Addendum)
Please return for any increasing shortness of breath chest tightness or any other complaints.  Increase your Lasix to 2 of the 40 mg pills in the morning and 1 in the evening.  Recheck with your doctor in the next couple days or return here for recheck and repeat blood work to make sure you are not getting too dehydrated.  Remember to try and keep your legs up as much as possible especially whenever you sit down.  Try to keep them above the level of your heart if at all possible.

## 2018-10-20 NOTE — ED Triage Notes (Signed)
Patient to ER for c/o swelling to bilateral legs, states swelling is progressing to abdomen. Patient states he was recently admitted for appendectomy, became septic after surgery. Patient states he has become increasingly more short of breath since being home. Has had Lasix doubled in dose with no relief. Patient states he is unable to lift his legs d/t amount of fluid.

## 2018-10-20 NOTE — ED Notes (Signed)
Pt updated. EDP Malinda will talk with pt at bedside soon. Pt requesting a soda; will give if EDP gives verbal okay. Pt calm and cooperative.

## 2018-10-20 NOTE — ED Notes (Addendum)
ED TO INPATIENT HANDOFF REPORT  ED Nurse Name and Phone #: Metta Clines [phone]  S Name/Age/Gender [name] 63 y.o. male Room/Bed: ED10A/ED10A  Code Status   Code Status: Prior  Home/SNF/Other Home Patient oriented to: self, place, time and situation Is this baseline? Yes   Triage Complete: Triage complete  Chief Complaint Post Op Problems  Triage Note Patient to ER for c/o swelling to bilateral legs, states swelling is progressing to abdomen. Patient states he was recently admitted for appendectomy, became septic after surgery. Patient states he has become increasingly more short of breath since being home. Has had Lasix doubled in dose with no relief. Patient states he is unable to lift his legs d/t amount of fluid.    Allergies No Known Allergies  Level of Care/Admitting Diagnosis ED Disposition    None      B Medical/Surgery History Past Medical History:  Diagnosis Date  . Asthma   . CHF (congestive heart failure) ([place])   . Coronary artery disease   . Diabetes mellitus without complication ([place])   . Foot ulcer ([place])    left foot  . GERD (gastroesophageal reflux disease)   . Heart murmur   . Hypertension   . Kidney insufficiency   . Peripheral vascular disease [facility])    Past Surgical History:  Procedure Laterality Date  . AMPUTATION TOE Left 03/13/2018   Procedure: AMPUTATION TOE-MPJ;  Surgeon: Samara Deist, DPM;  Location: [facility] ORS;  Service: Podiatry;  Laterality: Left;  . ANGIOPLASTY    . CARDIAC CATHETERIZATION    . CARDIAC SURGERY     heart bypass (4)  . CORONARY ARTERY BYPASS GRAFT    . LAPAROSCOPIC APPENDECTOMY N/A 09/08/2018   Procedure: APPENDECTOMY LAPAROSCOPIC;  Surgeon: Jules Husbands, MD;  Location: [facility] ORS;  Service: General;  Laterality: N/A;  . LOWER EXTREMITY ANGIOGRAPHY Left 03/02/2018   Procedure: LOWER EXTREMITY ANGIOGRAPHY;  Surgeon: Algernon Huxley, MD;  Location: [place] [facility];  Service: Cardiovascular;  Laterality: Left;   . ROTATOR CUFF REPAIR Left      A IV Location/Drains/Wounds Patient Lines/Drains/Airways Status   Active Line/Drains/Airways    Name:   Placement date:   Placement time:   Site:   Days:   Peripheral IV 10/20/18 Left Forearm   10/20/18    1551    Forearm   less than 1   Closed System Drain 1 Right;Lateral Abdomen Bulb (JP) 10 Fr.   09/23/18    1318    Abdomen   27   Incision (Closed) 09/08/18 Abdomen   09/08/18    1652     42   Incision - 4 Ports Abdomen Umbilicus Mid;Lower Left;Lateral   09/08/18    1620     42          Intake/Output Last 24 hours  Intake/Output Summary (Last 24 hours) at 10/20/2018 2025 Last data filed at 10/20/2018 2008 Gross per 24 hour  Intake -  Output 1110 ml  Net -1110 ml    Labs/Imaging Results for orders placed or performed during the hospital encounter of 10/20/18 (from the past 48 hour(s))  Comprehensive metabolic panel     Status: Abnormal   Collection Time: 10/20/18  3:57 PM  Result Value Ref Range   Sodium 138 135 - 145 mmol/L   Potassium 4.2 3.5 - 5.1 mmol/L   Chloride 106 98 - 111 mmol/L   CO2 22 22 - 32 mmol/L   Glucose, Bld 155 (H) 70 -  99 mg/dL   BUN 36 (H) 8 - 23 mg/dL   Creatinine, Ser 1.86 (H) 0.61 - 1.24 mg/dL   Calcium 8.1 (L) 8.9 - 10.3 mg/dL   Total Protein 7.2 6.5 - 8.1 g/dL   Albumin 3.0 (L) 3.5 - 5.0 g/dL   AST 16 15 - 41 U/L   ALT 10 0 - 44 U/L   Alkaline Phosphatase 68 38 - 126 U/L   Total Bilirubin 0.6 0.3 - 1.2 mg/dL   GFR calc non Af Amer 38 (L) >60 mL/min   GFR calc Af Amer 44 (L) >60 mL/min   Anion gap 10 5 - 15    Comment: Performed at [facility], [address] [place], [place] [address]  Troponin I - Once     Status: None   Collection Time: 10/20/18  3:57 PM  Result Value Ref Range   Troponin I <0.03 <0.03 ng/mL    Comment: Performed at [facility], [address] [place], [place] [address]  Brain natriuretic peptide     Status: Abnormal   Collection Time: 10/20/18  3:57 PM   Result Value Ref Range   B Natriuretic Peptide 1,016.0 (H) 0.0 - 100.0 pg/mL    Comment: Performed at [facility], [address] [place], [place] [address]  CBC with Differential     Status: Abnormal   Collection Time: 10/20/18  3:57 PM  Result Value Ref Range   WBC 5.4 4.0 - 10.5 K/uL   RBC 3.41 (L) 4.22 - 5.81 MIL/uL   Hemoglobin 9.5 (L) 13.0 - 17.0 g/dL   HCT 30.5 (L) 39.0 - 52.0 %   MCV 89.4 80.0 - 100.0 fL   MCH 27.9 26.0 - 34.0 pg   MCHC 31.1 30.0 - 36.0 g/dL   RDW 17.2 (H) 11.5 - 15.5 %   Platelets 148 (L) 150 - 400 K/uL   nRBC 0.0 0.0 - 0.2 %   Neutrophils Relative % 69 %   Neutro Abs 3.7 1.7 - 7.7 K/uL   Lymphocytes Relative 18 %   Lymphs Abs 0.9 0.7 - 4.0 K/uL   Monocytes Relative 6 %   Monocytes Absolute 0.3 0.1 - 1.0 K/uL   Eosinophils Relative 5 %   Eosinophils Absolute 0.3 0.0 - 0.5 K/uL   Basophils Relative 1 %   Basophils Absolute 0.0 0.0 - 0.1 K/uL   Immature Granulocytes 1 %   Abs Immature Granulocytes 0.03 0.00 - 0.07 K/uL    Comment: Performed at [facility], [address] [place], [place] [address]  Urinalysis, Complete w Microscopic     Status: Abnormal   Collection Time: 10/20/18  4:58 PM  Result Value Ref Range   Color, Urine YELLOW (A) YELLOW   APPearance CLEAR (A) CLEAR   Specific Gravity, Urine 1.009 1.005 - 1.030   pH 6.0 5.0 - 8.0   Glucose, UA NEGATIVE NEGATIVE mg/dL   Hgb urine dipstick SMALL (A) NEGATIVE   Bilirubin Urine NEGATIVE NEGATIVE   Ketones, ur NEGATIVE NEGATIVE mg/dL   Protein, ur 30 (A) NEGATIVE mg/dL   Nitrite NEGATIVE NEGATIVE   Leukocytes,Ua MODERATE (A) NEGATIVE   RBC / HPF 6-10 0 - 5 RBC/hpf   WBC, UA 0-5 0 - 5 WBC/hpf   Bacteria, UA NONE SEEN NONE SEEN   Squamous Epithelial / LPF 0-5 0 - 5   Mucus PRESENT    Hyaline Casts, UA PRESENT     Comment: Performed at [facility], [address], [place], [place] [address]   Dg Chest Portable 1 View  Result Date: 10/20/2018 CLINICAL  DATA:  Lower extremity edema.  Shortness of breath. EXAM: PORTABLE CHEST 1 VIEW COMPARISON:  September 23, 2018 FINDINGS: No edema or consolidation. Heart is mildly enlarged with pulmonary vascularity normal. No adenopathy. Patient is status post coronary artery bypass grafting. There is degenerative change in each shoulder. IMPRESSION: Mild cardiomegaly. Status post coronary artery bypass grafting. No edema or consolidation. Electronically Signed   By: Lowella Grip III M.D.   On: 10/20/2018 15:55    Pending Labs Unresulted Labs (From admission, onward)    Start     Ordered   10/20/18 1943  SARS Coronavirus 2 (CEPHEID - Performed in [place] hospital lab), [facility] Order  (Asymptomatic Patients Labs)  ONCE - STAT,   STAT    Question:  Rule Out  Answer:  Yes   10/20/18 1942          Vitals/Pain Today's Vitals   10/20/18 1930 10/20/18 1945 10/20/18 2000 10/20/18 2023  BP: (!) 173/89     Pulse: 66  74 67  Resp: (!) 22 (!) 21  (!) 22  Temp:      TempSrc:      SpO2: 97%  98% 97%  Weight:      Height:      PainSc:        Isolation Precautions No active isolations  Medications Medications  furosemide (LASIX) injection 60 mg (60 mg Intravenous Given 10/20/18 1623)    Mobility walks Low fall risk   Focused Assessments Cardiac Assessment Handoff:  Cardiac Rhythm: Normal sinus rhythm Lab Results  Component Value Date   TROPONINI <0.03 10/20/2018   No results found for: DDIMER Does the Patient currently have chest pain? No   Lungs: fine crackles   Edema in legs   R Recommendations: See Admitting Provider Note  Report given to:   Additional Notes:  L fa 20g IV

## 2018-10-20 NOTE — ED Notes (Signed)
EKG completed

## 2018-10-20 NOTE — ED Provider Notes (Signed)
[facility] Emergency Department Provider Note   ____________________________________________   First MD Initiated Contact with Patient 10/20/18 1524     (approximate)  I have reviewed the triage vital signs and the nursing notes.   HISTORY  Chief Complaint Leg Swelling and Shortness of Breath    HPI Phillip [name] is a 63 y.o. male reports leg swelling and shortness of breath for last 2-1/2 weeks since he had his appendectomy.  He denies any chest tightness or chest pain at any time during this period.  He has a history of congestive heart failure and takes Lasix.  His dose was increased from 40 once a day to 40 twice a day and still not helping.  Reports his legs have continued to increase in size and he gets short of breath and has to get up in the middle of the night because he cannot breathe.         Past Medical History:  Diagnosis Date  . Asthma   . CHF (congestive heart failure) ([place])   . Coronary artery disease   . Diabetes mellitus without complication ([place])   . Foot ulcer ([place])    left foot  . GERD (gastroesophageal reflux disease)   . Heart murmur   . Hypertension   . Kidney insufficiency   . Peripheral vascular disease [facility])     Patient Active Problem List   Diagnosis Date Noted  . Abscess of appendix 09/22/2018  . Appendicitis 09/08/2018  . Foot ulcer ([place]) 04/24/2018  . Diabetes mellitus without complication ([place]) 16/03/9603  . Hypertension 02/24/2018  . CHF (congestive heart failure) ([place]) 02/24/2018  . Atherosclerosis of native arteries of the extremities with ulceration ([place]) 02/24/2018    Past Surgical History:  Procedure Laterality Date  . AMPUTATION TOE Left 03/13/2018   Procedure: AMPUTATION TOE-MPJ;  Surgeon: Samara Deist, DPM;  Location: [facility] ORS;  Service: Podiatry;  Laterality: Left;  . ANGIOPLASTY    . CARDIAC CATHETERIZATION    . CARDIAC SURGERY     heart bypass (4)  . CORONARY ARTERY BYPASS GRAFT     . LAPAROSCOPIC APPENDECTOMY N/A 09/08/2018   Procedure: APPENDECTOMY LAPAROSCOPIC;  Surgeon: Jules Husbands, MD;  Location: [facility] ORS;  Service: General;  Laterality: N/A;  . LOWER EXTREMITY ANGIOGRAPHY Left 03/02/2018   Procedure: LOWER EXTREMITY ANGIOGRAPHY;  Surgeon: Algernon Huxley, MD;  Location: [place] CV LAB;  Service: Cardiovascular;  Laterality: Left;  . ROTATOR CUFF REPAIR Left     Prior to Admission medications   Medication Sig Start Date End Date Taking? Authorizing Provider  acetaminophen (TYLENOL) 500 MG tablet Take 1,000 mg by mouth every 6 (six) hours as needed for moderate pain or headache.    Yes [provider]  aspirin 81 MG tablet Take 81 mg by mouth daily.   Yes [provider]  carvedilol (COREG) 6.25 MG tablet Take 6.25 mg by mouth 2 (two) times daily with a meal.  11/14/15  Yes [provider]  cloNIDine (CATAPRES) 0.3 MG tablet Take 0.3 mg by mouth 2 (two) times daily.    Yes [provider]  clopidogrel (PLAVIX) 75 MG tablet Take 1 tablet (75 mg total) by mouth daily. 03/02/18  Yes Algernon Huxley, MD  furosemide (LASIX) 40 MG tablet Take 1 tablet by mouth twice daily 10/06/18  Yes [provider]  gabapentin (NEURONTIN) 600 MG tablet Take 600 mg by mouth 2 (two) times daily.    Yes [provider]  glipiZIDE (GLUCOTROL XL) 5 MG 24 hr tablet Take 5 mg by mouth daily with breakfast.    Yes [provider]  isosorbide mononitrate (IMDUR) 120 MG 24 hr tablet Take 120 mg by mouth daily.   Yes [provider]  levothyroxine (SYNTHROID) 25 MCG tablet Take 25 mcg by mouth daily.  10/09/18  Yes [provider]  lovastatin (MEVACOR) 40 MG tablet Take 40 mg by mouth at bedtime.   Yes [provider]  nitroGLYCERIN (NITROSTAT) 0.4 MG SL tablet Place 0.4 mg under the tongue every 5 (five) minutes as needed for chest pain.    Yes [provider]    Allergies Patient has no known allergies.   Family History  Problem Relation Age of Onset  . Hyperlipidemia Mother   . Hypertension Mother   . Diabetes Mother   . Heart attack Brother   . Heart disease Brother     Social History Social History   Tobacco Use  . Smoking status: Former Smoker    Last attempt to quit: 2003    Years since quitting: 17.3  . Smokeless tobacco: Never Used  Substance Use Topics  . Alcohol use: Not Currently    Comment: 2003  . Drug use: Not Currently    Types: Marijuana    Review of Systems Constitutional: No fever/chills Eyes: No visual changes. ENT: No sore throat. Cardiovascular: Denies chest pain. Respiratory:shortness of breath. Gastrointestinal: No abdominal pain.  No nausea, no vomiting.  No diarrhea.  No constipation. Genitourinary: Negative for dysuria. Musculoskeletal: Negative for back pain. Skin: Negative for rash. Neurological: Negative for headaches, focal weakness  ____________________________________________   PHYSICAL EXAM:  VITAL SIGNS: ED Triage Vitals [10/20/18 1520]  Enc Vitals Group     BP (!) 159/89     Pulse Rate 70     Resp 20     Temp 98.9 F (37.2 C)     Temp Source Oral     SpO2 99 %     Weight 240 lb 1.3 oz (108.9 kg)     Height 5\' 11"  (1.803 m)     Head Circumference      Peak Flow      Pain Score 0     Pain Loc      Pain Edu?      Excl. in [place]?     Constitutional: Alert and oriented. Well appearing but with leg swollen Eyes: Conjunctivae are normal.  Head: Atraumatic. Nose: No congestion/rhinnorhea. Mouth/Throat: Mucous membranes are moist.  Oropharynx non-erythematous. Neck: No stridor.  No JVD Cardiovascular: Normal rate, regular rhythm. Grossly normal heart sounds.  Good peripheral circulation. Respiratory: Normal respiratory effort.  No retractions. Lungs crackles in bases otherwise clear Gastrointestinal: Soft and nontender.  Mild distention. No abdominal bruits. No CVA tenderness. Musculoskeletal: No lower extremity tenderness  2+ edema to the thighs and high up on the thighs. Neurologic:  Normal speech and language. No gross focal neurologic deficits are appreciated. Skin:  Skin is warm, dry and intact. No rash noted.   ____________________________________________   LABS (all labs ordered are listed, but only abnormal results are displayed)  Labs Reviewed  COMPREHENSIVE METABOLIC PANEL - Abnormal; Notable for the following components:      Result Value   Glucose, Bld 155 (*)    BUN 36 (*)    Creatinine, Ser 1.86 (*)    Calcium 8.1 (*)    Albumin 3.0 (*)    GFR calc non Af Amer 38 (*)  GFR calc Af Amer 44 (*)    All other components within normal limits  BRAIN NATRIURETIC PEPTIDE - Abnormal; Notable for the following components:   B Natriuretic Peptide 1,016.0 (*)    All other components within normal limits  CBC WITH DIFFERENTIAL/PLATELET - Abnormal; Notable for the following components:   RBC 3.41 (*)    Hemoglobin 9.5 (*)    HCT 30.5 (*)    RDW 17.2 (*)    Platelets 148 (*)    All other components within normal limits  URINALYSIS, COMPLETE (UACMP) WITH MICROSCOPIC - Abnormal; Notable for the following components:   Color, Urine YELLOW (*)    APPearance CLEAR (*)    Hgb urine dipstick SMALL (*)    Protein, ur 30 (*)    Leukocytes,Ua MODERATE (*)    All other components within normal limits  SARS CORONAVIRUS 2 (HOSPITAL ORDER, [place] [facility])  TROPONIN I   ____________________________________________  EKG  EKG read and interpreted by me shows normal sinus rhythm rate of 75 rightward axis flipped T's in V5 and V6 and 3 and F ST-T wave changes no old EKGs are available to view.  There is an EKG report from October of last year that cannot tell me what the axis is and says ST and T wave abnormality consider lateral ischemia ____________________________________________  RADIOLOGY  ED MD interpretation:    Official radiology report(s): Dg Chest Portable 1 View   Result Date: 10/20/2018 CLINICAL DATA:  Lower extremity edema.  Shortness of breath. EXAM: PORTABLE CHEST 1 VIEW COMPARISON:  September 23, 2018 FINDINGS: No edema or consolidation. Heart is mildly enlarged with pulmonary vascularity normal. No adenopathy. Patient is status post coronary artery bypass grafting. There is degenerative change in each shoulder. IMPRESSION: Mild cardiomegaly. Status post coronary artery bypass grafting. No edema or consolidation. Electronically Signed   By: Lowella Grip III M.D.   On: 10/20/2018 15:55    ____________________________________________   PROCEDURES  Procedure(s) performed (including Critical Care):  Procedures   ____________________________________________   INITIAL IMPRESSION / ASSESSMENT AND PLAN / ED COURSE  Patient is put out about a liter of urine.  He feels better.  He wants to go home.  I will let him do this.  I will increase his Lasix to 80 in the morning and 40 in the evening.  I will have him follow-up with his doctor or return here for recheck in the next couple days.  He will return here for any further problems or worsening.              ____________________________________________   FINAL CLINICAL IMPRESSION(S) / ED DIAGNOSES  Final diagnoses:  Peripheral edema  Congestive heart failure, unspecified HF chronicity, unspecified heart failure type [facility])     ED Discharge Orders    None       Note:  This document was prepared using Dragon voice recognition software and may include unintentional dictation errors.    Nena Polio, MD 10/20/18 2124

## 2018-10-20 NOTE — ED Notes (Signed)
Verbal okay from [place] to give pt one soda.

## 2018-10-20 NOTE — ED Notes (Addendum)
Pt denies any needs. Given tv remote. Bed in lowest position. Rail up. Call bell within reach.

## 2018-10-20 NOTE — ED Notes (Signed)
Edema to legs 2+. Fine crackles in lower lobes.

## 2018-10-20 NOTE — ED Notes (Signed)
Pt given personal glasses from jacket at bedside as requested.

## 2018-10-22 ENCOUNTER — Other Ambulatory Visit: Payer: Self-pay

## 2018-10-22 NOTE — Patient Outreach (Signed)
Transition of care:  Placed call to patient and spoke with patient and wife via speaker phone.  Patient reports that he is doing okay.  Reviewed recent emergency department visit for heart failure. Patient now reports weighing daily, following a low salt diet and taking medications as prescribed.  Wife reports they got a new scale this week.  Patient continues to have swelling in the legs but it is decreased since the emergency department visit. Patient reports follow up planned with primary MD on June 1. States all drains are removed and abdomen is feeling better.  Currently remains active with PT and RN home health services.   Advanced directive: Reviewed importance of advance directives with patient and wife.  Agreed to a mailing with enclosed packet.   Outpatient Encounter Medications as of 10/22/2018  Medication Sig  . acetaminophen (TYLENOL) 500 MG tablet Take 1,000 mg by mouth every 6 (six) hours as needed for moderate pain or headache.   Marland Kitchen aspirin 81 MG tablet Take 81 mg by mouth daily.  . carvedilol (COREG) 6.25 MG tablet Take 6.25 mg by mouth 2 (two) times daily with a meal.   . cloNIDine (CATAPRES) 0.3 MG tablet Take 0.3 mg by mouth 2 (two) times daily.   . clopidogrel (PLAVIX) 75 MG tablet Take 1 tablet (75 mg total) by mouth daily.  . furosemide (LASIX) 40 MG tablet Take 1 tablet by mouth twice daily  . gabapentin (NEURONTIN) 600 MG tablet Take 600 mg by mouth 2 (two) times daily.   Marland Kitchen glipiZIDE (GLUCOTROL XL) 5 MG 24 hr tablet Take 5 mg by mouth daily with breakfast.   . isosorbide mononitrate (IMDUR) 120 MG 24 hr tablet Take 120 mg by mouth daily.  Marland Kitchen levothyroxine (SYNTHROID) 25 MCG tablet Take 25 mcg by mouth daily.   Marland Kitchen lovastatin (MEVACOR) 40 MG tablet Take 40 mg by mouth at bedtime.  . nitroGLYCERIN (NITROSTAT) 0.4 MG SL tablet Place 0.4 mg under the tongue every 5 (five) minutes as needed for chest pain.    No facility-administered encounter medications on file as of  10/22/2018.    Fall Risk  10/22/2018 10/12/2018  Falls in the past year? 0 0  Number falls in past yr: - 0  Injury with Fall? - 0   Depression screen PHQ 2/9 10/22/2018  Decreased Interest 0  Down, Depressed, Hopeless 0  PHQ - 2 Score 0    PLAN: Follow up call in 1 week.  Encouraged patient to continue to weigh daily and call MD for weight gain.  THN CM Care Plan Problem One     Most Recent Value  Care Plan Problem One  Recent abdominal surgery and abscess and uncontrolled DM  (Pended)   Role Documenting the Problem One  Care Management Coordinator  (Pended)   Centre for Problem One  Active  (Pended)   THN Long Term Goal   Patient and or wife will report no readmission to the hospital in the next 31 days.   (Pended)   THN Long Term Goal Start Date  10/15/18  (Pended)   THN CM Short Term Goal #1   Patient and or wife will report increase appetite in the next 14 days.  (Pended)   THN CM Short Term Goal #1 Start Date  10/08/18  (Pended)   THN CM Short Term Goal #1 Met Date  10/15/18  (Pended)   THN CM Short Term Goal #2   Wife and or patient will report CBG levels  remaining above 100 at all times for the next 30 days.   (Pended)   THN CM Short Term Goal #2 Start Date  10/08/18  (Pended)      Tomasa Rand, RN, BSN, [facility] [employer] [phone]

## 2018-10-29 ENCOUNTER — Other Ambulatory Visit: Payer: Self-pay

## 2018-10-29 NOTE — Patient Outreach (Signed)
Transition of care:  Placed call to patient and spoke with wife who reports she received packet in the mail. Reports she has not yet completed consent form. Encouraged wife to complete and return.  Wife states that patient has not weighed for the last 2 days. Reports something wrong with the battery. Reports a family member will get a new battery. Wife reports swelling is the same as it was last week. Reports she called cardiology and lasix was increased and metolazone was also increased.   Wife reports patient is walking in the home without difficulty. Reports CBG range of 200's. Has follow up planned with primary MD on 11/02/2018.  Wife reports no new problems or concerns. States she got a call from [facility] yesterday and was asked a bunch of questions.   PLAN: encouraged patient to weigh daily and call MD for weight gain of 3 pounds over night or 5 pounds in a week. Encouraged patient to follow up with primary MD as planned on 11/02/2018.  Confirmed transportation. Encouraged wife to return consent and review advanced directives.  Will await notification that [facility] has picked up patient and will provide report.  Tomasa Rand, RN, BSN, CEN [facility] [employer] [phone]

## 2018-11-02 DIAGNOSIS — Z794 Long term (current) use of insulin: Secondary | ICD-10-CM | POA: Diagnosis not present

## 2018-11-02 DIAGNOSIS — K5904 Chronic idiopathic constipation: Secondary | ICD-10-CM | POA: Insufficient documentation

## 2018-11-02 DIAGNOSIS — E039 Hypothyroidism, unspecified: Secondary | ICD-10-CM | POA: Diagnosis not present

## 2018-11-02 DIAGNOSIS — I5020 Unspecified systolic (congestive) heart failure: Secondary | ICD-10-CM | POA: Diagnosis not present

## 2018-11-02 DIAGNOSIS — I130 Hypertensive heart and chronic kidney disease with heart failure and stage 1 through stage 4 chronic kidney disease, or unspecified chronic kidney disease: Secondary | ICD-10-CM | POA: Diagnosis not present

## 2018-11-02 DIAGNOSIS — N184 Chronic kidney disease, stage 4 (severe): Secondary | ICD-10-CM | POA: Diagnosis not present

## 2018-11-02 DIAGNOSIS — G4700 Insomnia, unspecified: Secondary | ICD-10-CM | POA: Insufficient documentation

## 2018-11-02 DIAGNOSIS — E1122 Type 2 diabetes mellitus with diabetic chronic kidney disease: Secondary | ICD-10-CM | POA: Diagnosis not present

## 2018-11-02 HISTORY — DX: Insomnia, unspecified: G47.00

## 2018-11-02 HISTORY — DX: Chronic idiopathic constipation: K59.04

## 2018-11-04 ENCOUNTER — Other Ambulatory Visit: Payer: Self-pay

## 2018-11-04 DIAGNOSIS — E1122 Type 2 diabetes mellitus with diabetic chronic kidney disease: Secondary | ICD-10-CM | POA: Diagnosis not present

## 2018-11-04 DIAGNOSIS — R600 Localized edema: Secondary | ICD-10-CM | POA: Insufficient documentation

## 2018-11-04 DIAGNOSIS — N179 Acute kidney failure, unspecified: Secondary | ICD-10-CM | POA: Diagnosis not present

## 2018-11-04 DIAGNOSIS — N183 Chronic kidney disease, stage 3 (moderate): Secondary | ICD-10-CM | POA: Diagnosis not present

## 2018-11-04 DIAGNOSIS — Z794 Long term (current) use of insulin: Secondary | ICD-10-CM | POA: Diagnosis not present

## 2018-11-04 DIAGNOSIS — I129 Hypertensive chronic kidney disease with stage 1 through stage 4 chronic kidney disease, or unspecified chronic kidney disease: Secondary | ICD-10-CM | POA: Diagnosis not present

## 2018-11-04 HISTORY — DX: Localized edema: R60.0

## 2018-11-04 NOTE — Patient Outreach (Signed)
THN case management case closure and transition to [facility]:  Confirmed patient is on the list for [facility] and that [facility] has reached out to patient. Emailed report sent to [place].  Placed call to wife and confirmed patient is active with [facility] and I would be closing case. Wife very appreciative of services.  PLAN: close case and transferred to [facility].  Tomasa Rand, RN, BSN, CEN [facility] [employer] [phone]

## 2018-11-05 ENCOUNTER — Ambulatory Visit: Payer: Self-pay

## 2018-11-09 ENCOUNTER — Other Ambulatory Visit: Payer: Self-pay

## 2018-11-09 ENCOUNTER — Ambulatory Visit (INDEPENDENT_AMBULATORY_CARE_PROVIDER_SITE_OTHER): Payer: PPO | Admitting: Surgery

## 2018-11-09 ENCOUNTER — Encounter: Payer: Self-pay | Admitting: Surgery

## 2018-11-09 VITALS — BP 195/84 | HR 79 | Temp 97.7°F | Ht 72.0 in | Wt 228.0 lb

## 2018-11-09 DIAGNOSIS — Z09 Encounter for follow-up examination after completed treatment for conditions other than malignant neoplasm: Secondary | ICD-10-CM

## 2018-11-09 NOTE — Patient Instructions (Signed)
Return as needed.The patient is aware to call back for any questions or concerns.  

## 2018-11-09 NOTE — Progress Notes (Signed)
Outpatient Surgical Follow Up  11/09/2018  Phillip [name] is an 63 y.o. male.   Chief Complaint  Patient presents with  . Routine Post Op    aapendix    HPI: s/p lap appy w abscess s/w Perc drainage. Doing well. HE has multiple co-morbidities including CHF, chronic renal failure, HTN, DM. His overall health over the last year has been very delicate and he decompensates very easily. Last time was issues with hypoglycemia. Those are better controlled. Creat seems to stabilized as well and no recent events of hyperkalemia. He follows w nephrology.  HE Had a recent CHF exacerbation episode that responded to diuretics. HE is very appreciative with the care he has received.  He also understands that he is fragile and has had multiple health issues that have not been easy to deal with. He denies any pain, fevers or chills. Taking PO well. His SBP > 190 today    Past Medical History:  Diagnosis Date  . Asthma   . CHF (congestive heart failure) ([place])   . Coronary artery disease   . Diabetes mellitus without complication ([place])   . Foot ulcer ([place])    left foot  . GERD (gastroesophageal reflux disease)   . Heart murmur   . Hypertension   . Kidney insufficiency   . Peripheral vascular disease [facility])     Past Surgical History:  Procedure Laterality Date  . AMPUTATION TOE Left 03/13/2018   Procedure: AMPUTATION TOE-MPJ;  Surgeon: Samara Deist, DPM;  Location: [facility] ORS;  Service: Podiatry;  Laterality: Left;  . ANGIOPLASTY    . CARDIAC CATHETERIZATION    . CARDIAC SURGERY     heart bypass (4)  . CORONARY ARTERY BYPASS GRAFT    . LAPAROSCOPIC APPENDECTOMY N/A 09/08/2018   Procedure: APPENDECTOMY LAPAROSCOPIC;  Surgeon: Jules Husbands, MD;  Location: [facility] ORS;  Service: General;  Laterality: N/A;  . LOWER EXTREMITY ANGIOGRAPHY Left 03/02/2018   Procedure: LOWER EXTREMITY ANGIOGRAPHY;  Surgeon: Algernon Huxley, MD;  Location: [place] [facility] LAB;  Service: Cardiovascular;  Laterality: Left;   . ROTATOR CUFF REPAIR Left     Family History  Problem Relation Age of Onset  . Hyperlipidemia Mother   . Hypertension Mother   . Diabetes Mother   . Heart attack Brother   . Heart disease Brother     Social History:  reports that he quit smoking about 17 years ago. He has never used smokeless tobacco. He reports previous alcohol use. He reports previous drug use. Drug: Marijuana.  Allergies: No Known Allergies  Medications reviewed.    ROS Full ROS performed and is otherwise negative other than what is stated in HPI   BP (!) 195/84   Pulse 79   Temp 97.7 F (36.5 C) (Skin)   Ht 6' (1.829 m)   Wt 228 lb (103.4 kg)   SpO2 98%   BMI 30.92 kg/m   Physical Exam NAD, chronically ill, comes with a walker  Abd: soft, NT, no peritonitis, scars . No open wounds Ext: some pitting edema with significant improvement as compared to the last visit   Assessment/Plan: Doing well No surgical issues at this time F/U prn Advice about DM and HTN control   Caroleen Hamman, MD [place] Surgeon

## 2018-11-12 DIAGNOSIS — E1142 Type 2 diabetes mellitus with diabetic polyneuropathy: Secondary | ICD-10-CM | POA: Diagnosis not present

## 2018-11-12 DIAGNOSIS — L97522 Non-pressure chronic ulcer of other part of left foot with fat layer exposed: Secondary | ICD-10-CM | POA: Diagnosis not present

## 2018-11-12 DIAGNOSIS — M2042 Other hammer toe(s) (acquired), left foot: Secondary | ICD-10-CM | POA: Diagnosis not present

## 2018-11-12 DIAGNOSIS — Z89412 Acquired absence of left great toe: Secondary | ICD-10-CM | POA: Diagnosis not present

## 2018-11-12 DIAGNOSIS — E1152 Type 2 diabetes mellitus with diabetic peripheral angiopathy with gangrene: Secondary | ICD-10-CM | POA: Diagnosis not present

## 2018-12-02 DIAGNOSIS — I129 Hypertensive chronic kidney disease with stage 1 through stage 4 chronic kidney disease, or unspecified chronic kidney disease: Secondary | ICD-10-CM | POA: Diagnosis not present

## 2018-12-02 DIAGNOSIS — G8929 Other chronic pain: Secondary | ICD-10-CM

## 2018-12-02 DIAGNOSIS — I5020 Unspecified systolic (congestive) heart failure: Secondary | ICD-10-CM | POA: Diagnosis not present

## 2018-12-02 DIAGNOSIS — N183 Chronic kidney disease, stage 3 (moderate): Secondary | ICD-10-CM | POA: Diagnosis not present

## 2018-12-02 DIAGNOSIS — Z79899 Other long term (current) drug therapy: Secondary | ICD-10-CM | POA: Diagnosis not present

## 2018-12-02 DIAGNOSIS — Z794 Long term (current) use of insulin: Secondary | ICD-10-CM | POA: Diagnosis not present

## 2018-12-02 DIAGNOSIS — E1122 Type 2 diabetes mellitus with diabetic chronic kidney disease: Secondary | ICD-10-CM | POA: Diagnosis not present

## 2018-12-02 DIAGNOSIS — M545 Low back pain, unspecified: Secondary | ICD-10-CM

## 2018-12-02 HISTORY — DX: Other chronic pain: G89.29

## 2018-12-02 HISTORY — DX: Low back pain, unspecified: M54.50

## 2019-01-04 DIAGNOSIS — N183 Chronic kidney disease, stage 3 (moderate): Secondary | ICD-10-CM | POA: Diagnosis not present

## 2019-01-06 DIAGNOSIS — Z794 Long term (current) use of insulin: Secondary | ICD-10-CM | POA: Diagnosis not present

## 2019-01-06 DIAGNOSIS — N179 Acute kidney failure, unspecified: Secondary | ICD-10-CM | POA: Diagnosis not present

## 2019-01-06 DIAGNOSIS — N183 Chronic kidney disease, stage 3 (moderate): Secondary | ICD-10-CM | POA: Diagnosis not present

## 2019-01-06 DIAGNOSIS — I129 Hypertensive chronic kidney disease with stage 1 through stage 4 chronic kidney disease, or unspecified chronic kidney disease: Secondary | ICD-10-CM | POA: Diagnosis not present

## 2019-01-06 DIAGNOSIS — E1122 Type 2 diabetes mellitus with diabetic chronic kidney disease: Secondary | ICD-10-CM | POA: Diagnosis not present

## 2019-01-07 DIAGNOSIS — L97509 Non-pressure chronic ulcer of other part of unspecified foot with unspecified severity: Secondary | ICD-10-CM | POA: Diagnosis not present

## 2019-01-07 DIAGNOSIS — E11621 Type 2 diabetes mellitus with foot ulcer: Secondary | ICD-10-CM | POA: Diagnosis not present

## 2019-01-07 DIAGNOSIS — M79672 Pain in left foot: Secondary | ICD-10-CM | POA: Diagnosis not present

## 2019-01-07 DIAGNOSIS — Z794 Long term (current) use of insulin: Secondary | ICD-10-CM | POA: Diagnosis not present

## 2019-01-07 DIAGNOSIS — L97522 Non-pressure chronic ulcer of other part of left foot with fat layer exposed: Secondary | ICD-10-CM | POA: Diagnosis not present

## 2019-01-28 DIAGNOSIS — E11621 Type 2 diabetes mellitus with foot ulcer: Secondary | ICD-10-CM | POA: Diagnosis not present

## 2019-01-28 DIAGNOSIS — Z794 Long term (current) use of insulin: Secondary | ICD-10-CM | POA: Diagnosis not present

## 2019-01-28 DIAGNOSIS — B351 Tinea unguium: Secondary | ICD-10-CM | POA: Diagnosis not present

## 2019-01-28 DIAGNOSIS — L97509 Non-pressure chronic ulcer of other part of unspecified foot with unspecified severity: Secondary | ICD-10-CM | POA: Diagnosis not present

## 2019-01-28 DIAGNOSIS — L97522 Non-pressure chronic ulcer of other part of left foot with fat layer exposed: Secondary | ICD-10-CM | POA: Diagnosis not present

## 2019-03-02 DIAGNOSIS — L97509 Non-pressure chronic ulcer of other part of unspecified foot with unspecified severity: Secondary | ICD-10-CM | POA: Diagnosis not present

## 2019-03-02 DIAGNOSIS — Z794 Long term (current) use of insulin: Secondary | ICD-10-CM | POA: Diagnosis not present

## 2019-03-02 DIAGNOSIS — Z89412 Acquired absence of left great toe: Secondary | ICD-10-CM | POA: Diagnosis not present

## 2019-03-02 DIAGNOSIS — E1142 Type 2 diabetes mellitus with diabetic polyneuropathy: Secondary | ICD-10-CM | POA: Diagnosis not present

## 2019-03-02 DIAGNOSIS — E11621 Type 2 diabetes mellitus with foot ulcer: Secondary | ICD-10-CM | POA: Diagnosis not present

## 2019-04-05 DIAGNOSIS — N183 Chronic kidney disease, stage 3 unspecified: Secondary | ICD-10-CM | POA: Diagnosis not present

## 2019-04-08 DIAGNOSIS — I129 Hypertensive chronic kidney disease with stage 1 through stage 4 chronic kidney disease, or unspecified chronic kidney disease: Secondary | ICD-10-CM | POA: Diagnosis not present

## 2019-04-08 DIAGNOSIS — N1832 Chronic kidney disease, stage 3b: Secondary | ICD-10-CM | POA: Diagnosis not present

## 2019-04-08 DIAGNOSIS — Z794 Long term (current) use of insulin: Secondary | ICD-10-CM | POA: Diagnosis not present

## 2019-04-08 DIAGNOSIS — E1121 Type 2 diabetes mellitus with diabetic nephropathy: Secondary | ICD-10-CM | POA: Diagnosis not present

## 2019-04-08 DIAGNOSIS — N183 Chronic kidney disease, stage 3 unspecified: Secondary | ICD-10-CM | POA: Diagnosis not present

## 2019-04-22 DIAGNOSIS — R0600 Dyspnea, unspecified: Secondary | ICD-10-CM | POA: Diagnosis not present

## 2019-04-22 DIAGNOSIS — E039 Hypothyroidism, unspecified: Secondary | ICD-10-CM | POA: Diagnosis not present

## 2019-04-22 DIAGNOSIS — Z79899 Other long term (current) drug therapy: Secondary | ICD-10-CM | POA: Diagnosis not present

## 2019-04-22 DIAGNOSIS — N183 Chronic kidney disease, stage 3 unspecified: Secondary | ICD-10-CM | POA: Diagnosis not present

## 2019-04-22 DIAGNOSIS — I129 Hypertensive chronic kidney disease with stage 1 through stage 4 chronic kidney disease, or unspecified chronic kidney disease: Secondary | ICD-10-CM | POA: Diagnosis not present

## 2019-04-22 DIAGNOSIS — I25118 Atherosclerotic heart disease of native coronary artery with other forms of angina pectoris: Secondary | ICD-10-CM | POA: Diagnosis not present

## 2019-04-22 DIAGNOSIS — Z794 Long term (current) use of insulin: Secondary | ICD-10-CM | POA: Diagnosis not present

## 2019-04-22 DIAGNOSIS — E1121 Type 2 diabetes mellitus with diabetic nephropathy: Secondary | ICD-10-CM | POA: Diagnosis not present

## 2019-04-22 DIAGNOSIS — I5020 Unspecified systolic (congestive) heart failure: Secondary | ICD-10-CM | POA: Diagnosis not present

## 2019-04-22 DIAGNOSIS — R600 Localized edema: Secondary | ICD-10-CM | POA: Diagnosis not present

## 2019-04-22 DIAGNOSIS — I739 Peripheral vascular disease, unspecified: Secondary | ICD-10-CM | POA: Diagnosis not present

## 2019-04-22 DIAGNOSIS — R079 Chest pain, unspecified: Secondary | ICD-10-CM | POA: Diagnosis not present

## 2019-04-22 DIAGNOSIS — N1832 Chronic kidney disease, stage 3b: Secondary | ICD-10-CM | POA: Diagnosis not present

## 2019-04-26 DIAGNOSIS — E1122 Type 2 diabetes mellitus with diabetic chronic kidney disease: Secondary | ICD-10-CM | POA: Diagnosis not present

## 2019-04-26 DIAGNOSIS — Z79899 Other long term (current) drug therapy: Secondary | ICD-10-CM | POA: Diagnosis not present

## 2019-04-26 DIAGNOSIS — N183 Chronic kidney disease, stage 3 unspecified: Secondary | ICD-10-CM | POA: Diagnosis not present

## 2019-04-26 DIAGNOSIS — Z87891 Personal history of nicotine dependence: Secondary | ICD-10-CM | POA: Diagnosis not present

## 2019-04-26 DIAGNOSIS — Z7902 Long term (current) use of antithrombotics/antiplatelets: Secondary | ICD-10-CM | POA: Diagnosis not present

## 2019-04-26 DIAGNOSIS — Z7982 Long term (current) use of aspirin: Secondary | ICD-10-CM | POA: Diagnosis not present

## 2019-04-26 DIAGNOSIS — I130 Hypertensive heart and chronic kidney disease with heart failure and stage 1 through stage 4 chronic kidney disease, or unspecified chronic kidney disease: Secondary | ICD-10-CM | POA: Diagnosis not present

## 2019-04-26 DIAGNOSIS — I509 Heart failure, unspecified: Secondary | ICD-10-CM | POA: Diagnosis not present

## 2019-04-26 DIAGNOSIS — R600 Localized edema: Secondary | ICD-10-CM | POA: Diagnosis not present

## 2019-04-26 DIAGNOSIS — I110 Hypertensive heart disease with heart failure: Secondary | ICD-10-CM | POA: Diagnosis not present

## 2019-04-26 DIAGNOSIS — Z794 Long term (current) use of insulin: Secondary | ICD-10-CM | POA: Diagnosis not present

## 2019-04-27 DIAGNOSIS — R9431 Abnormal electrocardiogram [ECG] [EKG]: Secondary | ICD-10-CM | POA: Diagnosis not present

## 2019-05-05 ENCOUNTER — Telehealth: Payer: Self-pay | Admitting: *Deleted

## 2019-05-05 DIAGNOSIS — Z91199 Patient's noncompliance with other medical treatment and regimen due to unspecified reason: Secondary | ICD-10-CM

## 2019-05-05 DIAGNOSIS — Z794 Long term (current) use of insulin: Secondary | ICD-10-CM | POA: Diagnosis not present

## 2019-05-05 DIAGNOSIS — E1121 Type 2 diabetes mellitus with diabetic nephropathy: Secondary | ICD-10-CM | POA: Diagnosis not present

## 2019-05-05 DIAGNOSIS — I509 Heart failure, unspecified: Secondary | ICD-10-CM | POA: Diagnosis not present

## 2019-05-05 DIAGNOSIS — Z0000 Encounter for general adult medical examination without abnormal findings: Secondary | ICD-10-CM | POA: Diagnosis not present

## 2019-05-05 DIAGNOSIS — R600 Localized edema: Secondary | ICD-10-CM | POA: Diagnosis not present

## 2019-05-05 DIAGNOSIS — I25118 Atherosclerotic heart disease of native coronary artery with other forms of angina pectoris: Secondary | ICD-10-CM | POA: Diagnosis not present

## 2019-05-05 DIAGNOSIS — N1832 Chronic kidney disease, stage 3b: Secondary | ICD-10-CM | POA: Diagnosis not present

## 2019-05-05 DIAGNOSIS — I130 Hypertensive heart and chronic kidney disease with heart failure and stage 1 through stage 4 chronic kidney disease, or unspecified chronic kidney disease: Secondary | ICD-10-CM | POA: Diagnosis not present

## 2019-05-05 DIAGNOSIS — Z23 Encounter for immunization: Secondary | ICD-10-CM | POA: Diagnosis not present

## 2019-05-05 DIAGNOSIS — Z9119 Patient's noncompliance with other medical treatment and regimen: Secondary | ICD-10-CM | POA: Diagnosis not present

## 2019-05-05 DIAGNOSIS — I739 Peripheral vascular disease, unspecified: Secondary | ICD-10-CM | POA: Diagnosis not present

## 2019-05-05 HISTORY — DX: Patient's noncompliance with other medical treatment and regimen due to unspecified reason: Z91.199

## 2019-05-05 HISTORY — DX: Patient's noncompliance with other medical treatment and regimen: Z91.19

## 2019-05-06 ENCOUNTER — Telehealth (INDEPENDENT_AMBULATORY_CARE_PROVIDER_SITE_OTHER): Payer: Self-pay

## 2019-05-06 DIAGNOSIS — Z79899 Other long term (current) drug therapy: Secondary | ICD-10-CM | POA: Diagnosis not present

## 2019-05-06 DIAGNOSIS — E11649 Type 2 diabetes mellitus with hypoglycemia without coma: Secondary | ICD-10-CM | POA: Diagnosis not present

## 2019-05-06 DIAGNOSIS — R0600 Dyspnea, unspecified: Secondary | ICD-10-CM | POA: Diagnosis not present

## 2019-05-06 DIAGNOSIS — E1122 Type 2 diabetes mellitus with diabetic chronic kidney disease: Secondary | ICD-10-CM | POA: Diagnosis not present

## 2019-05-06 DIAGNOSIS — I5033 Acute on chronic diastolic (congestive) heart failure: Secondary | ICD-10-CM | POA: Diagnosis not present

## 2019-05-06 DIAGNOSIS — N183 Chronic kidney disease, stage 3 unspecified: Secondary | ICD-10-CM | POA: Diagnosis not present

## 2019-05-06 DIAGNOSIS — Z89429 Acquired absence of other toe(s), unspecified side: Secondary | ICD-10-CM | POA: Diagnosis not present

## 2019-05-06 DIAGNOSIS — R7989 Other specified abnormal findings of blood chemistry: Secondary | ICD-10-CM | POA: Diagnosis not present

## 2019-05-06 DIAGNOSIS — I2510 Atherosclerotic heart disease of native coronary artery without angina pectoris: Secondary | ICD-10-CM | POA: Diagnosis not present

## 2019-05-06 DIAGNOSIS — I509 Heart failure, unspecified: Secondary | ICD-10-CM | POA: Diagnosis not present

## 2019-05-06 DIAGNOSIS — E1140 Type 2 diabetes mellitus with diabetic neuropathy, unspecified: Secondary | ICD-10-CM | POA: Diagnosis not present

## 2019-05-06 DIAGNOSIS — Z794 Long term (current) use of insulin: Secondary | ICD-10-CM | POA: Diagnosis not present

## 2019-05-06 DIAGNOSIS — I130 Hypertensive heart and chronic kidney disease with heart failure and stage 1 through stage 4 chronic kidney disease, or unspecified chronic kidney disease: Secondary | ICD-10-CM | POA: Diagnosis not present

## 2019-05-06 DIAGNOSIS — I5023 Acute on chronic systolic (congestive) heart failure: Secondary | ICD-10-CM | POA: Diagnosis not present

## 2019-05-06 DIAGNOSIS — E039 Hypothyroidism, unspecified: Secondary | ICD-10-CM | POA: Diagnosis not present

## 2019-05-06 DIAGNOSIS — Z7982 Long term (current) use of aspirin: Secondary | ICD-10-CM | POA: Diagnosis not present

## 2019-05-06 DIAGNOSIS — Z87891 Personal history of nicotine dependence: Secondary | ICD-10-CM | POA: Diagnosis not present

## 2019-05-06 DIAGNOSIS — R590 Localized enlarged lymph nodes: Secondary | ICD-10-CM | POA: Diagnosis not present

## 2019-05-06 DIAGNOSIS — N189 Chronic kidney disease, unspecified: Secondary | ICD-10-CM | POA: Diagnosis not present

## 2019-05-06 DIAGNOSIS — R931 Abnormal findings on diagnostic imaging of heart and coronary circulation: Secondary | ICD-10-CM | POA: Diagnosis not present

## 2019-05-06 DIAGNOSIS — E1142 Type 2 diabetes mellitus with diabetic polyneuropathy: Secondary | ICD-10-CM | POA: Diagnosis not present

## 2019-05-06 DIAGNOSIS — R600 Localized edema: Secondary | ICD-10-CM | POA: Diagnosis not present

## 2019-05-06 DIAGNOSIS — I082 Rheumatic disorders of both aortic and tricuspid valves: Secondary | ICD-10-CM | POA: Diagnosis not present

## 2019-05-06 DIAGNOSIS — Z6828 Body mass index (BMI) 28.0-28.9, adult: Secondary | ICD-10-CM | POA: Diagnosis not present

## 2019-05-06 DIAGNOSIS — Z7902 Long term (current) use of antithrombotics/antiplatelets: Secondary | ICD-10-CM | POA: Diagnosis not present

## 2019-05-06 DIAGNOSIS — R0989 Other specified symptoms and signs involving the circulatory and respiratory systems: Secondary | ICD-10-CM | POA: Diagnosis not present

## 2019-05-06 DIAGNOSIS — I110 Hypertensive heart disease with heart failure: Secondary | ICD-10-CM | POA: Diagnosis not present

## 2019-05-06 DIAGNOSIS — Z20828 Contact with and (suspected) exposure to other viral communicable diseases: Secondary | ICD-10-CM | POA: Diagnosis not present

## 2019-05-06 DIAGNOSIS — E1151 Type 2 diabetes mellitus with diabetic peripheral angiopathy without gangrene: Secondary | ICD-10-CM | POA: Diagnosis not present

## 2019-05-06 DIAGNOSIS — Z951 Presence of aortocoronary bypass graft: Secondary | ICD-10-CM | POA: Diagnosis not present

## 2019-05-06 DIAGNOSIS — M545 Low back pain: Secondary | ICD-10-CM | POA: Diagnosis not present

## 2019-05-06 NOTE — Telephone Encounter (Signed)
PATIENT SCHEDULED FOR FIRST AVAILABLE APPT 05/21/19 @ 1pm for both ultrasounds and followup.

## 2019-05-06 NOTE — Telephone Encounter (Signed)
I called patient back to offer 12/4 and he declined states he has to take his daughter to [facility]. I advised him we will place him on the cancellation list.

## 2019-05-06 NOTE — Telephone Encounter (Signed)
Patient has not been seen in our office in over a year. Last visit was on 04/24/18. He was placed on Plavix for PAD / endovascular intervention and should still be taking the medication unless he has experienced a contraindication. I recommend the patient make an appointment ASAP for a DVT study / ABI and to be seen by a provider.

## 2019-05-06 NOTE — Telephone Encounter (Signed)
I left a message on the patient voicemail to return a call to the office

## 2019-05-07 DIAGNOSIS — R931 Abnormal findings on diagnostic imaging of heart and coronary circulation: Secondary | ICD-10-CM

## 2019-05-07 DIAGNOSIS — E1142 Type 2 diabetes mellitus with diabetic polyneuropathy: Secondary | ICD-10-CM | POA: Insufficient documentation

## 2019-05-07 DIAGNOSIS — E162 Hypoglycemia, unspecified: Secondary | ICD-10-CM | POA: Insufficient documentation

## 2019-05-07 HISTORY — DX: Hypoglycemia, unspecified: E16.2

## 2019-05-07 HISTORY — DX: Abnormal findings on diagnostic imaging of heart and coronary circulation: R93.1

## 2019-05-07 HISTORY — DX: Type 2 diabetes mellitus with diabetic polyneuropathy: E11.42

## 2019-05-12 ENCOUNTER — Encounter: Payer: Self-pay | Admitting: Cardiology

## 2019-05-12 ENCOUNTER — Other Ambulatory Visit: Payer: Self-pay

## 2019-05-12 ENCOUNTER — Ambulatory Visit (INDEPENDENT_AMBULATORY_CARE_PROVIDER_SITE_OTHER): Payer: PPO | Admitting: Cardiology

## 2019-05-12 VITALS — BP 154/82 | HR 73 | Ht 72.0 in | Wt 218.8 lb

## 2019-05-12 DIAGNOSIS — I350 Nonrheumatic aortic (valve) stenosis: Secondary | ICD-10-CM

## 2019-05-12 DIAGNOSIS — E782 Mixed hyperlipidemia: Secondary | ICD-10-CM | POA: Diagnosis not present

## 2019-05-12 DIAGNOSIS — I2510 Atherosclerotic heart disease of native coronary artery without angina pectoris: Secondary | ICD-10-CM

## 2019-05-12 DIAGNOSIS — Z794 Long term (current) use of insulin: Secondary | ICD-10-CM

## 2019-05-12 DIAGNOSIS — N1831 Chronic kidney disease, stage 3a: Secondary | ICD-10-CM

## 2019-05-12 DIAGNOSIS — I340 Nonrheumatic mitral (valve) insufficiency: Secondary | ICD-10-CM

## 2019-05-12 DIAGNOSIS — I5030 Unspecified diastolic (congestive) heart failure: Secondary | ICD-10-CM | POA: Diagnosis not present

## 2019-05-12 DIAGNOSIS — E1121 Type 2 diabetes mellitus with diabetic nephropathy: Secondary | ICD-10-CM

## 2019-05-12 DIAGNOSIS — G4730 Sleep apnea, unspecified: Secondary | ICD-10-CM

## 2019-05-12 DIAGNOSIS — I351 Nonrheumatic aortic (valve) insufficiency: Secondary | ICD-10-CM

## 2019-05-12 HISTORY — DX: Nonrheumatic aortic (valve) insufficiency: I35.1

## 2019-05-12 HISTORY — DX: Nonrheumatic aortic (valve) stenosis: I35.0

## 2019-05-12 HISTORY — DX: Nonrheumatic mitral (valve) insufficiency: I34.0

## 2019-05-12 NOTE — Progress Notes (Signed)
Cardiology Office Note:    Date:  05/12/2019   ID:  Phillip [name], DOB 11/27/1955, MRN [id number]  PCP:  Phillip [name]., MD  Cardiologist:  Phillip Lindau, MD   Referring MD: Phillip [name]., MD    ASSESSMENT:    1. Coronary artery disease involving native coronary artery of native heart without angina pectoris   2. Diastolic congestive heart failure, unspecified HF chronicity ([name])   3. Mixed hyperlipidemia   4. Type 2 diabetes mellitus with stage 3a chronic kidney disease, with long-term current use of insulin ([name])   5. Sleep apnea, unspecified type   6. Mild aortic stenosis   7. Moderate aortic regurgitation   8. Mitral valve insufficiency, unspecified etiology    PLAN:    In order of problems listed above:  1. Coronary artery disease and cardiomyopathy: I reviewed findings with the patient at extensive length.  Patient has multiple complex issues.  He is on appropriate medications at this time.  He was advised to check his weight on a regular basis.  Secondary prevention stressed.  Importance of compliance with diet and medication stressed and he vocalized understanding.  I wanted to do complete blood work but he is going to see his primary care physician in the next few days and prefers to get all his blood work today.  I respect his wishes.  I told him to tell his doctor to send me a copy of this blood work.  Advised him to check his weight on a daily basis and educated him about congestive heart failure and he promises to comply. 2. Essential hypertension: Blood pressure stable salt intake issues were discussed 3. Mixed dyslipidemia: Diet was discussed.  Lipids followed by primary care physician. 4. Diabetes mellitus: Managed by primary care physician.  Patient also has advanced renal insufficiency.  This is probably related to his diabetes. 5. Multi valvular heart disease with mild aortic stenosis, mild to moderate aortic regurgitation, mild to moderate mitral and  tricuspid regurgitation.  I discussed these issues with the patient at extensive length.  Medical therapy was recommended at this time and is agreeable.  In view of reduced ejection fraction I had discussed with him about other issues such as the possibility of electrophysiology evaluation.  He is not keen on it at this time.  He wants to think about it.  I respect his wishes.  I explained to him about issues such as sudden cardiac death and he understands and he will be seen in follow-up appointment in 3 months or earlier if there is any concerns he wants more time to think about this.  Patient has multiple medical issues which are complex and is very frail.  Total time for this evaluation was 45 minutes and this including counseling and extensive review of records from multiple institutions.   Medication Adjustments/Labs and Tests Ordered: Current medicines are reviewed at length with the patient today.  Concerns regarding medicines are outlined above.  No orders of the defined types were placed in this encounter.  No orders of the defined types were placed in this encounter.    Chief Complaint  Patient presents with  . Coronary Artery Disease     History of Present Illness:    Phillip [name] is a 63 y.o. male.  Patient has past medical history of coronary artery disease, essential hypertension, dyslipidemia, diabetes mellitus, significant renal insufficiency, mild to moderate mitral and aortic regurgitation and mild aortic stenosis.  Echocardiogram done at  [facility] earlier during the year revealed preserved systolic function.  Recent records about echocardiogram done at [facility] reveals an ejection fraction of 30 to 35%.  Patient has multiple comorbidities and is a frail gentleman.  He denies any chest pain orthopnea or PND.  He leads a sedentary lifestyle.  His grandson is with him for support.  Past Medical History:  Diagnosis Date  . Asthma   . CHF (congestive heart  failure) ([place])   . Coronary artery disease   . Diabetes mellitus without complication ([place])   . Foot ulcer ([place])    left foot  . GERD (gastroesophageal reflux disease)   . Heart murmur   . Hypertension   . Kidney insufficiency   . Peripheral vascular disease [facility])     Past Surgical History:  Procedure Laterality Date  . AMPUTATION TOE Left 03/13/2018   Procedure: AMPUTATION TOE-MPJ;  Surgeon: Samara Deist, DPM;  Location: [facility] ORS;  Service: Podiatry;  Laterality: Left;  . ANGIOPLASTY    . CARDIAC CATHETERIZATION    . CARDIAC SURGERY     heart bypass (4)  . CORONARY ARTERY BYPASS GRAFT    . LAPAROSCOPIC APPENDECTOMY N/A 09/08/2018   Procedure: APPENDECTOMY LAPAROSCOPIC;  Surgeon: Jules Husbands, MD;  Location: [facility] ORS;  Service: General;  Laterality: N/A;  . LOWER EXTREMITY ANGIOGRAPHY Left 03/02/2018   Procedure: LOWER EXTREMITY ANGIOGRAPHY;  Surgeon: Algernon Huxley, MD;  Location: [place] [facility];  Service: Cardiovascular;  Laterality: Left;  . ROTATOR CUFF REPAIR Left     Current Medications: Current Meds  Medication Sig  . acetaminophen (TYLENOL) 500 MG tablet Take 1,000 mg by mouth every 6 (six) hours as needed for moderate pain or headache.   Marland Kitchen aspirin 81 MG tablet Take 81 mg by mouth daily.  . carvedilol (COREG) 6.25 MG tablet Take 6.25 mg by mouth 2 (two) times daily with a meal.   . cloNIDine (CATAPRES) 0.3 MG tablet Take 0.3 mg by mouth 2 (two) times daily.   . clopidogrel (PLAVIX) 75 MG tablet Take 1 tablet (75 mg total) by mouth daily.  . cyclobenzaprine (FLEXERIL) 10 MG tablet SMARTSIG:1-2.5 Tablet(s) By Mouth Every 8 Hours PRN  . furosemide (LASIX) 40 MG tablet Take 1 tablet by mouth twice daily  . gabapentin (NEURONTIN) 600 MG tablet Take 600 mg by mouth 2 (two) times daily.   Marland Kitchen glipiZIDE (GLUCOTROL XL) 5 MG 24 hr tablet Take 5 mg by mouth daily with breakfast.   . isosorbide mononitrate (IMDUR) 120 MG 24 hr tablet Take 120 mg by mouth daily.  Marland Kitchen LEVEMIR  FLEXTOUCH 100 UNIT/ML Pen USE AS DIRECTED ONLY STARTING AT 10 UNITS AT NIGHT. MAXIMUM DOSE 50 UNITS DAILY  . levothyroxine (SYNTHROID) 25 MCG tablet Take 25 mcg by mouth daily.   Marland Kitchen lisinopril (ZESTRIL) 40 MG tablet Take 40 mg by mouth daily.  Marland Kitchen lovastatin (MEVACOR) 40 MG tablet Take 40 mg by mouth at bedtime.  . metolazone (ZAROXOLYN) 5 MG tablet Take 5 mg by mouth daily.  . nitroGLYCERIN (NITROSTAT) 0.4 MG SL tablet Place 0.4 mg under the tongue every 5 (five) minutes as needed for chest pain.      Allergies:   Patient has no known allergies.   Social History   Socioeconomic History  . Marital status: Married    Spouse name: Not on file  . Number of children: Not on file  . Years of education: Not on file  . Highest education level: Not  on file  Occupational History  . Not on file  Social Needs  . Financial resource strain: Not on file  . Food insecurity    Worry: Not on file    Inability: Not on file  . Transportation needs    Medical: Not on file    Non-medical: Not on file  Tobacco Use  . Smoking status: Former Smoker    Quit date: 2003    Years since quitting: 17.9  . Smokeless tobacco: Never Used  Substance and Sexual Activity  . Alcohol use: Not Currently    Comment: 2003  . Drug use: Not Currently    Types: Marijuana  . Sexual activity: Not on file  Lifestyle  . Physical activity    Days per week: Not on file    Minutes per session: Not on file  . Stress: Not on file  Relationships  . Social Herbalist on phone: Not on file    Gets together: Not on file    Attends religious service: Not on file    Active member of club or organization: Not on file    Attends meetings of clubs or organizations: Not on file    Relationship status: Not on file  Other Topics Concern  . Not on file  Social History Narrative  . Not on file     Family History: The patient's family history includes Diabetes in his mother; Heart attack in his brother; Heart disease  in his brother; Hyperlipidemia in his mother; Hypertension in his mother; Lung disease in his father.  ROS:   Please see the history of present illness.    All other systems reviewed and are negative.  EKGs/Labs/Other Studies Reviewed:    The following studies were reviewed today: IMPRESSIONS    1. The left ventricle has low normal systolic function, with an ejection fraction of 50-55%. The cavity size was normal. There is moderate concentric left ventricular hypertrophy. Left ventricular diastolic Doppler parameters are consistent with  impaired relaxation.  2. The right ventricle has normal systolic function. The cavity was mildly enlarged. There is no increase in right ventricular wall thickness.  3. Left atrial size was mildly dilated.  4. Right atrial size was mildly dilated.  5. The tricuspid valve is not well visualized. Tricuspid valve regurgitation is mild-moderate.  6. The aortic valve is abnormal. Moderate thickening of the aortic valve. Moderate calcification of the aortic valve. Aortic valve regurgitation is trivial by color flow Doppler. Mild-moderate stenosis of the aortic valve.  7. The interatrial septum was not assessed.    Recent Labs: 10/08/2018: Magnesium 2.1 10/20/2018: ALT 10; B Natriuretic Peptide 1,016.0; BUN 36; Creatinine, Ser 1.86; Hemoglobin 9.5; Platelets 148; Potassium 4.2; Sodium 138  Recent Lipid Panel No results found for: CHOL, TRIG, HDL, CHOLHDL, VLDL, LDLCALC, LDLDIRECT  Physical Exam:    VS:  BP (!) 154/82   Pulse 73   Ht 6' (1.829 m)   Wt 218 lb 12.8 oz (99.2 kg)   SpO2 96%   BMI 29.67 kg/m     Wt Readings from Last 3 Encounters:  05/12/19 218 lb 12.8 oz (99.2 kg)  11/09/18 228 lb (103.4 kg)  10/22/18 233 lb (105.7 kg)     GEN: Patient is in no acute distress HEENT: Normal NECK: No JVD; No carotid bruits LYMPHATICS: No lymphadenopathy CARDIAC: Hear sounds regular, 2/6 systolic murmur at the apex. RESPIRATORY:  Clear to  auscultation without rales, wheezing or rhonchi  ABDOMEN: Soft, non-tender,  non-distended MUSCULOSKELETAL:  No edema; No deformity  SKIN: Warm and dry NEUROLOGIC:  Alert and oriented x 3 PSYCHIATRIC:  Normal affect   Signed, Phillip Lindau, MD  05/12/2019 10:19 AM    [place]

## 2019-05-12 NOTE — Patient Instructions (Signed)
Medication Instructions:  Your physician recommends that you continue on your current medications as directed. Please refer to the Current Medication list given to you today.  *If you need a refill on your cardiac medications before your next appointment, please call your pharmacy*  Lab Work: NONE If you have labs (blood work) drawn today and your tests are completely normal, you will receive your results only by: Marland Kitchen MyChart Message (if you have MyChart) OR . A paper copy in the mail If you have any lab test that is abnormal or we need to change your treatment, we will call you to review the results.  Testing/Procedures: NONE  Follow-Up: At [facility], you and your health needs are our priority.  As part of our continuing mission to provide you with exceptional heart care, we have created designated Provider Care Teams.  These Care Teams include your primary Cardiologist (physician) and Advanced Practice Providers (APPs -  Physician Assistants and Nurse Practitioners) who all work together to provide you with the care you need, when you need it.  Your next appointment:   3 month(s)  The format for your next appointment:   In Person  Provider:   Jyl Heinz, MD

## 2019-05-14 NOTE — Telephone Encounter (Signed)
error 

## 2019-05-17 ENCOUNTER — Other Ambulatory Visit (INDEPENDENT_AMBULATORY_CARE_PROVIDER_SITE_OTHER): Payer: Self-pay | Admitting: Vascular Surgery

## 2019-05-17 DIAGNOSIS — E039 Hypothyroidism, unspecified: Secondary | ICD-10-CM | POA: Diagnosis not present

## 2019-05-17 DIAGNOSIS — Z794 Long term (current) use of insulin: Secondary | ICD-10-CM | POA: Diagnosis not present

## 2019-05-17 DIAGNOSIS — R5381 Other malaise: Secondary | ICD-10-CM | POA: Diagnosis not present

## 2019-05-17 DIAGNOSIS — I10 Essential (primary) hypertension: Secondary | ICD-10-CM | POA: Diagnosis not present

## 2019-05-17 DIAGNOSIS — N1832 Chronic kidney disease, stage 3b: Secondary | ICD-10-CM | POA: Diagnosis not present

## 2019-05-17 DIAGNOSIS — M7989 Other specified soft tissue disorders: Secondary | ICD-10-CM

## 2019-05-17 DIAGNOSIS — E1121 Type 2 diabetes mellitus with diabetic nephropathy: Secondary | ICD-10-CM | POA: Diagnosis not present

## 2019-05-17 DIAGNOSIS — I509 Heart failure, unspecified: Secondary | ICD-10-CM | POA: Diagnosis not present

## 2019-05-17 DIAGNOSIS — E782 Mixed hyperlipidemia: Secondary | ICD-10-CM | POA: Diagnosis not present

## 2019-05-17 DIAGNOSIS — R5383 Other fatigue: Secondary | ICD-10-CM | POA: Diagnosis not present

## 2019-05-21 ENCOUNTER — Encounter

## 2019-05-21 ENCOUNTER — Ambulatory Visit (INDEPENDENT_AMBULATORY_CARE_PROVIDER_SITE_OTHER): Payer: PPO | Admitting: Nurse Practitioner

## 2019-05-21 ENCOUNTER — Encounter (INDEPENDENT_AMBULATORY_CARE_PROVIDER_SITE_OTHER): Payer: PPO

## 2019-06-03 DIAGNOSIS — B351 Tinea unguium: Secondary | ICD-10-CM | POA: Diagnosis not present

## 2019-06-03 DIAGNOSIS — E1142 Type 2 diabetes mellitus with diabetic polyneuropathy: Secondary | ICD-10-CM | POA: Diagnosis not present

## 2019-06-03 DIAGNOSIS — Z794 Long term (current) use of insulin: Secondary | ICD-10-CM | POA: Diagnosis not present

## 2019-06-03 DIAGNOSIS — E11621 Type 2 diabetes mellitus with foot ulcer: Secondary | ICD-10-CM | POA: Diagnosis not present

## 2019-06-03 DIAGNOSIS — L97509 Non-pressure chronic ulcer of other part of unspecified foot with unspecified severity: Secondary | ICD-10-CM | POA: Diagnosis not present

## 2019-06-03 DIAGNOSIS — Z89412 Acquired absence of left great toe: Secondary | ICD-10-CM | POA: Diagnosis not present

## 2019-06-16 DIAGNOSIS — N1832 Chronic kidney disease, stage 3b: Secondary | ICD-10-CM | POA: Diagnosis not present

## 2019-06-16 DIAGNOSIS — E538 Deficiency of other specified B group vitamins: Secondary | ICD-10-CM | POA: Insufficient documentation

## 2019-06-16 DIAGNOSIS — E1121 Type 2 diabetes mellitus with diabetic nephropathy: Secondary | ICD-10-CM | POA: Diagnosis not present

## 2019-06-16 DIAGNOSIS — Z794 Long term (current) use of insulin: Secondary | ICD-10-CM | POA: Diagnosis not present

## 2019-06-16 DIAGNOSIS — I130 Hypertensive heart and chronic kidney disease with heart failure and stage 1 through stage 4 chronic kidney disease, or unspecified chronic kidney disease: Secondary | ICD-10-CM | POA: Diagnosis not present

## 2019-06-16 DIAGNOSIS — I5032 Chronic diastolic (congestive) heart failure: Secondary | ICD-10-CM | POA: Diagnosis not present

## 2019-06-16 HISTORY — DX: Deficiency of other specified B group vitamins: E53.8

## 2019-07-02 ENCOUNTER — Encounter (INDEPENDENT_AMBULATORY_CARE_PROVIDER_SITE_OTHER): Payer: Self-pay | Admitting: Nurse Practitioner

## 2019-07-09 DIAGNOSIS — N1832 Chronic kidney disease, stage 3b: Secondary | ICD-10-CM | POA: Diagnosis not present

## 2019-07-14 DIAGNOSIS — N1832 Chronic kidney disease, stage 3b: Secondary | ICD-10-CM | POA: Diagnosis not present

## 2019-07-14 DIAGNOSIS — E1121 Type 2 diabetes mellitus with diabetic nephropathy: Secondary | ICD-10-CM | POA: Diagnosis not present

## 2019-07-14 DIAGNOSIS — I129 Hypertensive chronic kidney disease with stage 1 through stage 4 chronic kidney disease, or unspecified chronic kidney disease: Secondary | ICD-10-CM | POA: Diagnosis not present

## 2019-07-14 DIAGNOSIS — N183 Chronic kidney disease, stage 3 unspecified: Secondary | ICD-10-CM | POA: Diagnosis not present

## 2019-07-14 DIAGNOSIS — E559 Vitamin D deficiency, unspecified: Secondary | ICD-10-CM | POA: Diagnosis not present

## 2019-07-14 DIAGNOSIS — Z794 Long term (current) use of insulin: Secondary | ICD-10-CM | POA: Diagnosis not present

## 2019-08-10 ENCOUNTER — Encounter: Payer: Self-pay | Admitting: Cardiology

## 2019-08-10 ENCOUNTER — Other Ambulatory Visit: Payer: Self-pay

## 2019-08-10 ENCOUNTER — Ambulatory Visit: Payer: PPO | Admitting: Cardiology

## 2019-08-10 VITALS — BP 140/80 | HR 66 | Ht 72.0 in | Wt 217.0 lb

## 2019-08-10 DIAGNOSIS — Z1329 Encounter for screening for other suspected endocrine disorder: Secondary | ICD-10-CM | POA: Diagnosis not present

## 2019-08-10 DIAGNOSIS — N1831 Chronic kidney disease, stage 3a: Secondary | ICD-10-CM

## 2019-08-10 DIAGNOSIS — E1122 Type 2 diabetes mellitus with diabetic chronic kidney disease: Secondary | ICD-10-CM

## 2019-08-10 DIAGNOSIS — I2510 Atherosclerotic heart disease of native coronary artery without angina pectoris: Secondary | ICD-10-CM | POA: Diagnosis not present

## 2019-08-10 DIAGNOSIS — I10 Essential (primary) hypertension: Secondary | ICD-10-CM

## 2019-08-10 DIAGNOSIS — E782 Mixed hyperlipidemia: Secondary | ICD-10-CM

## 2019-08-10 DIAGNOSIS — Z794 Long term (current) use of insulin: Secondary | ICD-10-CM | POA: Diagnosis not present

## 2019-08-10 DIAGNOSIS — I340 Nonrheumatic mitral (valve) insufficiency: Secondary | ICD-10-CM

## 2019-08-10 DIAGNOSIS — I5030 Unspecified diastolic (congestive) heart failure: Secondary | ICD-10-CM | POA: Diagnosis not present

## 2019-08-10 DIAGNOSIS — E1121 Type 2 diabetes mellitus with diabetic nephropathy: Secondary | ICD-10-CM | POA: Diagnosis not present

## 2019-08-10 NOTE — Progress Notes (Signed)
Cardiology Office Note:    Date:  08/10/2019   ID:  Phillip [name], DOB March 26, 1956, MRN [id number]  PCP:  Raina Mina., MD  Cardiologist:  Jenean Lindau, MD   Referring MD: Raina Mina., MD    ASSESSMENT:    1. Benign essential hypertension   2. Coronary artery disease involving native coronary artery of native heart without angina pectoris   3. Mixed hyperlipidemia   4. Mitral valve insufficiency, unspecified etiology   5. Type 2 diabetes mellitus with stage 3a chronic kidney disease, with long-term current use of insulin (HCC)    PLAN:    In order of problems listed above:  1. Primary prevention stressed with the patient.  Importance of compliance with diet medication stressed and he vocalized understanding 2. Congestive heart failure: Diet was discussed.  His systolic function is preserved.  He is very careful with salt intake issues.  He weighs himself on a regular basis. 3. Bilateral pedal edema right greater than left.  Patient mentions to me that this has been going on for the past several months.  We will do a DVT study.  He said such study was done in the past and this was negative.  I do not have those records.  Diet was discussed for dyslipidemia and diabetes mellitus and he promises to do better.  Weight reduction was stressed. 4. Patient will be seen in follow-up appointment in 2 months or earlier if the patient has any concerns    Medication Adjustments/Labs and Tests Ordered: Current medicines are reviewed at length with the patient today.  Concerns regarding medicines are outlined above.  No orders of the defined types were placed in this encounter.  No orders of the defined types were placed in this encounter.    Chief Complaint  Patient presents with  . Follow-up    3 Months     History of Present Illness:    Phillip [name] is a 64 y.o. male.  Patient has past medical history of diastolic congestive heart failure, essential hypertension  dyslipidemia diabetes mellitus.  He mentions to me that he is feeling fine overall.  He has had long-term shortness of breath on exertion.  No chest pain orthopnea or PND.  He complains of pedal edema.  Right greater than left this is been happening for the past several months.  At the time of my evaluation, the patient is alert awake oriented and in no distress.  Past Medical History:  Diagnosis Date  . Asthma   . CHF (congestive heart failure) ([place])   . Coronary artery disease   . Diabetes mellitus without complication ([place])   . Foot ulcer ([place])    left foot  . GERD (gastroesophageal reflux disease)   . Heart murmur   . Hypertension   . Kidney insufficiency   . Peripheral vascular disease [facility])     Past Surgical History:  Procedure Laterality Date  . AMPUTATION TOE Left 03/13/2018   Procedure: AMPUTATION TOE-MPJ;  Surgeon: Samara Deist, DPM;  Location: [facility] ORS;  Service: Podiatry;  Laterality: Left;  . ANGIOPLASTY    . CARDIAC CATHETERIZATION    . CARDIAC SURGERY     heart bypass (4)  . CORONARY ARTERY BYPASS GRAFT    . LAPAROSCOPIC APPENDECTOMY N/A 09/08/2018   Procedure: APPENDECTOMY LAPAROSCOPIC;  Surgeon: Jules Husbands, MD;  Location: [facility] ORS;  Service: General;  Laterality: N/A;  . LOWER EXTREMITY ANGIOGRAPHY Left 03/02/2018   Procedure: LOWER EXTREMITY ANGIOGRAPHY;  Surgeon: Algernon Huxley, MD;  Location: [place] [facility] LAB;  Service: Cardiovascular;  Laterality: Left;  . ROTATOR CUFF REPAIR Left     Current Medications: Current Meds  Medication Sig  . acetaminophen (TYLENOL) 500 MG tablet Take 1,000 mg by mouth every 6 (six) hours as needed for moderate pain or headache.   Marland Kitchen aspirin 81 MG tablet Take 81 mg by mouth daily.  . carvedilol (COREG) 6.25 MG tablet Take 6.25 mg by mouth 2 (two) times daily with a meal.   . cloNIDine (CATAPRES) 0.3 MG tablet Take 0.3 mg by mouth 2 (two) times daily.   . clopidogrel (PLAVIX) 75 MG tablet Take 1 tablet (75 mg total) by mouth  daily.  . cyclobenzaprine (FLEXERIL) 10 MG tablet SMARTSIG:1-2.5 Tablet(s) By Mouth Every 8 Hours PRN  . furosemide (LASIX) 40 MG tablet Take 1 tablet by mouth twice daily  . gabapentin (NEURONTIN) 600 MG tablet Take 600 mg by mouth 2 (two) times daily.   . isosorbide mononitrate (IMDUR) 120 MG 24 hr tablet Take 120 mg by mouth daily.  Marland Kitchen LEVEMIR FLEXTOUCH 100 UNIT/ML Pen USE AS DIRECTED ONLY STARTING AT 10 UNITS AT NIGHT. MAXIMUM DOSE 50 UNITS DAILY  . levothyroxine (SYNTHROID) 25 MCG tablet Take 25 mcg by mouth daily.   Marland Kitchen lisinopril (ZESTRIL) 40 MG tablet Take 40 mg by mouth daily.  Marland Kitchen lovastatin (MEVACOR) 40 MG tablet Take 40 mg by mouth at bedtime.  . metolazone (ZAROXOLYN) 5 MG tablet Take 5 mg by mouth daily.  . nitroGLYCERIN (NITROSTAT) 0.4 MG SL tablet Place 0.4 mg under the tongue every 5 (five) minutes as needed for chest pain.   . vitamin B-12 (CYANOCOBALAMIN) 500 MCG tablet Take by mouth.     Allergies:   Patient has no known allergies.   Social History   Socioeconomic History  . Marital status: Married    Spouse name: Not on file  . Number of children: Not on file  . Years of education: Not on file  . Highest education level: Not on file  Occupational History  . Not on file  Tobacco Use  . Smoking status: Former Smoker    Quit date: 2003    Years since quitting: 18.1  . Smokeless tobacco: Never Used  Substance and Sexual Activity  . Alcohol use: Not Currently    Comment: 2003  . Drug use: Not Currently    Types: Marijuana  . Sexual activity: Not on file  Other Topics Concern  . Not on file  Social History Narrative  . Not on file   Social Determinants of Health   Financial Resource Strain:   . Difficulty of Paying Living Expenses: Not on file  Food Insecurity:   . Worried About Charity fundraiser in the Last Year: Not on file  . Ran Out of Food in the Last Year: Not on file  Transportation Needs:   . Lack of Transportation (Medical): Not on file  . Lack  of Transportation (Non-Medical): Not on file  Physical Activity:   . Days of Exercise per Week: Not on file  . Minutes of Exercise per Session: Not on file  Stress:   . Feeling of Stress : Not on file  Social Connections:   . Frequency of Communication with Friends and Family: Not on file  . Frequency of Social Gatherings with Friends and Family: Not on file  . Attends Religious Services: Not on file  . Active Member of Clubs or Organizations:  Not on file  . Attends Archivist Meetings: Not on file  . Marital Status: Not on file     Family History: The patient's family history includes Diabetes in his mother; Heart attack in his brother; Heart disease in his brother; Hyperlipidemia in his mother; Hypertension in his mother; Lung disease in his father.  ROS:   Please see the history of present illness.    All other systems reviewed and are negative.  EKGs/Labs/Other Studies Reviewed:    The following studies were reviewed today: I discussed my findings with the patient at length.   Recent Labs: 10/08/2018: Magnesium 2.1 10/20/2018: ALT 10; B Natriuretic Peptide 1,016.0; BUN 36; Creatinine, Ser 1.86; Hemoglobin 9.5; Platelets 148; Potassium 4.2; Sodium 138  Recent Lipid Panel No results found for: CHOL, TRIG, HDL, CHOLHDL, VLDL, LDLCALC, LDLDIRECT  Physical Exam:    VS:  BP 140/80   Pulse 66   Ht 6' (1.829 m)   Wt 217 lb (98.4 kg)   SpO2 98%   BMI 29.43 kg/m     Wt Readings from Last 3 Encounters:  08/10/19 217 lb (98.4 kg)  05/12/19 218 lb 12.8 oz (99.2 kg)  11/09/18 228 lb (103.4 kg)     GEN: Patient is in no acute distress HEENT: Normal NECK: No JVD; No carotid bruits LYMPHATICS: No lymphadenopathy CARDIAC: Hear sounds regular, 2/6 systolic murmur at the apex. RESPIRATORY:  Clear to auscultation without rales, wheezing or rhonchi  ABDOMEN: Soft, non-tender, non-distended MUSCULOSKELETAL: Bilateral pedal edema, right greater than left; No deformity    SKIN: Warm and dry NEUROLOGIC:  Alert and oriented x 3 PSYCHIATRIC:  Normal affect   Signed, Jenean Lindau, MD  08/10/2019 10:31 AM    [place]

## 2019-08-10 NOTE — Patient Instructions (Signed)
Medication Instructions:  No medication changes *If you need a refill on your cardiac medications before your next appointment, please call your pharmacy*   Lab Work: You had labs done today.  If you have labs (blood work) drawn today and your tests are completely normal, you will receive your results only by: Marland Kitchen MyChart Message (if you have MyChart) OR . A paper copy in the mail If you have any lab test that is abnormal or we need to change your treatment, we will call you to review the results.   Testing/Procedures: Dr. Geraldo Pitter has ordered you to have a venous doppler.   Follow-Up: At [facility], you and your health needs are our priority.  As part of our continuing mission to provide you with exceptional heart care, we have created designated Provider Care Teams.  These Care Teams include your primary Cardiologist (physician) and Advanced Practice Providers (APPs -  Physician Assistants and Nurse Practitioners) who all work together to provide you with the care you need, when you need it.  We recommend signing up for the patient portal called "MyChart".  Sign up information is provided on this After Visit Summary.  MyChart is used to connect with patients for Virtual Visits (Telemedicine).  Patients are able to view lab/test results, encounter notes, upcoming appointments, etc.  Non-urgent messages can be sent to your provider as well.   To learn more about what you can do with MyChart, go to [email or web address].    Your next appointment:   2 month(s)  The format for your next appointment:   In Person  Provider:   Jyl Heinz, MD   Other Instructions NA

## 2019-08-11 LAB — CBC WITH DIFFERENTIAL/PLATELET
Basophils Absolute: 0 10*3/uL (ref 0.0–0.2)
Basos: 1 %
EOS (ABSOLUTE): 0.2 10*3/uL (ref 0.0–0.4)
Eos: 3 %
Hematocrit: 37.1 % — ABNORMAL LOW (ref 37.5–51.0)
Hemoglobin: 11.6 g/dL — ABNORMAL LOW (ref 13.0–17.7)
Immature Grans (Abs): 0.1 10*3/uL (ref 0.0–0.1)
Immature Granulocytes: 1 %
Lymphocytes Absolute: 0.7 10*3/uL (ref 0.7–3.1)
Lymphs: 14 %
MCH: 28.5 pg (ref 26.6–33.0)
MCHC: 31.3 g/dL — ABNORMAL LOW (ref 31.5–35.7)
MCV: 91 fL (ref 79–97)
Monocytes Absolute: 0.3 10*3/uL (ref 0.1–0.9)
Monocytes: 6 %
Neutrophils Absolute: 4 10*3/uL (ref 1.4–7.0)
Neutrophils: 75 %
Platelets: 143 10*3/uL — ABNORMAL LOW (ref 150–450)
RBC: 4.07 x10E6/uL — ABNORMAL LOW (ref 4.14–5.80)
RDW: 14.5 % (ref 11.6–15.4)
WBC: 5.2 10*3/uL (ref 3.4–10.8)

## 2019-08-11 LAB — BASIC METABOLIC PANEL
BUN/Creatinine Ratio: 19 (ref 10–24)
BUN: 47 mg/dL — ABNORMAL HIGH (ref 8–27)
CO2: 26 mmol/L (ref 20–29)
Calcium: 8.9 mg/dL (ref 8.6–10.2)
Chloride: 106 mmol/L (ref 96–106)
Creatinine, Ser: 2.53 mg/dL — ABNORMAL HIGH (ref 0.76–1.27)
GFR calc Af Amer: 30 mL/min/{1.73_m2} — ABNORMAL LOW (ref >59)
GFR calc non Af Amer: 26 mL/min/{1.73_m2} — ABNORMAL LOW (ref >59)
Glucose: 213 mg/dL — ABNORMAL HIGH (ref 65–99)
Potassium: 4.9 mmol/L (ref 3.5–5.2)
Sodium: 146 mmol/L — ABNORMAL HIGH (ref 134–144)

## 2019-08-11 LAB — LIPID PANEL
Chol/HDL Ratio: 3.1 ratio (ref 0.0–5.0)
Cholesterol, Total: 77 mg/dL — ABNORMAL LOW (ref 100–199)
HDL: 25 mg/dL — ABNORMAL LOW (ref >39)
LDL Chol Calc (NIH): 37 mg/dL (ref 0–99)
Triglycerides: 65 mg/dL (ref 0–149)
VLDL Cholesterol Cal: 15 mg/dL (ref 5–40)

## 2019-08-11 LAB — HEPATIC FUNCTION PANEL
ALT: 8 IU/L (ref 0–44)
AST: 13 IU/L (ref 0–40)
Albumin: 3.9 g/dL (ref 3.8–4.8)
Alkaline Phosphatase: 95 IU/L (ref 39–117)
Bilirubin Total: 0.7 mg/dL (ref 0.0–1.2)
Bilirubin, Direct: 0.3 mg/dL (ref 0.00–0.40)
Total Protein: 7.2 g/dL (ref 6.0–8.5)

## 2019-08-11 LAB — BRAIN NATRIURETIC PEPTIDE: BNP: 840.1 pg/mL — ABNORMAL HIGH (ref 0.0–100.0)

## 2019-08-11 LAB — TSH: TSH: 2.95 u[IU]/mL (ref 0.450–4.500)

## 2019-08-12 ENCOUNTER — Other Ambulatory Visit: Payer: Self-pay

## 2019-08-12 DIAGNOSIS — E782 Mixed hyperlipidemia: Secondary | ICD-10-CM

## 2019-08-12 MED ORDER — LOVASTATIN 40 MG PO TABS: 20.0000 mg | ORAL_TABLET | Freq: Every day | ORAL | Status: DC

## 2019-08-18 DIAGNOSIS — Z794 Long term (current) use of insulin: Secondary | ICD-10-CM | POA: Diagnosis not present

## 2019-08-18 DIAGNOSIS — D638 Anemia in other chronic diseases classified elsewhere: Secondary | ICD-10-CM | POA: Insufficient documentation

## 2019-08-18 DIAGNOSIS — I130 Hypertensive heart and chronic kidney disease with heart failure and stage 1 through stage 4 chronic kidney disease, or unspecified chronic kidney disease: Secondary | ICD-10-CM | POA: Diagnosis not present

## 2019-08-18 DIAGNOSIS — I5032 Chronic diastolic (congestive) heart failure: Secondary | ICD-10-CM | POA: Diagnosis not present

## 2019-08-18 DIAGNOSIS — N1832 Chronic kidney disease, stage 3b: Secondary | ICD-10-CM | POA: Diagnosis not present

## 2019-08-18 DIAGNOSIS — E1121 Type 2 diabetes mellitus with diabetic nephropathy: Secondary | ICD-10-CM | POA: Diagnosis not present

## 2019-08-18 DIAGNOSIS — I129 Hypertensive chronic kidney disease with stage 1 through stage 4 chronic kidney disease, or unspecified chronic kidney disease: Secondary | ICD-10-CM | POA: Diagnosis not present

## 2019-08-18 DIAGNOSIS — I25118 Atherosclerotic heart disease of native coronary artery with other forms of angina pectoris: Secondary | ICD-10-CM | POA: Diagnosis not present

## 2019-08-18 DIAGNOSIS — E039 Hypothyroidism, unspecified: Secondary | ICD-10-CM | POA: Diagnosis not present

## 2019-08-18 HISTORY — DX: Anemia in other chronic diseases classified elsewhere: D63.8

## 2019-09-17 ENCOUNTER — Other Ambulatory Visit: Payer: Self-pay

## 2019-09-17 ENCOUNTER — Ambulatory Visit (INDEPENDENT_AMBULATORY_CARE_PROVIDER_SITE_OTHER): Payer: PPO

## 2019-09-17 DIAGNOSIS — R600 Localized edema: Secondary | ICD-10-CM

## 2019-09-17 DIAGNOSIS — I340 Nonrheumatic mitral (valve) insufficiency: Secondary | ICD-10-CM | POA: Diagnosis not present

## 2019-09-17 NOTE — Progress Notes (Addendum)
Bilateral lower extremity duplex exam has been performed.  Jimmy Talon Regala RDCS, RVT 

## 2019-09-27 DIAGNOSIS — I5043 Acute on chronic combined systolic (congestive) and diastolic (congestive) heart failure: Secondary | ICD-10-CM | POA: Diagnosis not present

## 2019-09-27 DIAGNOSIS — I2510 Atherosclerotic heart disease of native coronary artery without angina pectoris: Secondary | ICD-10-CM | POA: Diagnosis not present

## 2019-09-27 DIAGNOSIS — Z79899 Other long term (current) drug therapy: Secondary | ICD-10-CM | POA: Diagnosis not present

## 2019-09-27 DIAGNOSIS — I110 Hypertensive heart disease with heart failure: Secondary | ICD-10-CM | POA: Diagnosis not present

## 2019-09-27 DIAGNOSIS — I130 Hypertensive heart and chronic kidney disease with heart failure and stage 1 through stage 4 chronic kidney disease, or unspecified chronic kidney disease: Secondary | ICD-10-CM | POA: Diagnosis not present

## 2019-09-27 DIAGNOSIS — R079 Chest pain, unspecified: Secondary | ICD-10-CM | POA: Diagnosis not present

## 2019-09-27 DIAGNOSIS — I5023 Acute on chronic systolic (congestive) heart failure: Secondary | ICD-10-CM | POA: Diagnosis not present

## 2019-09-27 DIAGNOSIS — R0602 Shortness of breath: Secondary | ICD-10-CM | POA: Diagnosis not present

## 2019-09-27 DIAGNOSIS — E11649 Type 2 diabetes mellitus with hypoglycemia without coma: Secondary | ICD-10-CM | POA: Diagnosis not present

## 2019-09-27 DIAGNOSIS — Z79890 Hormone replacement therapy (postmenopausal): Secondary | ICD-10-CM | POA: Diagnosis not present

## 2019-09-27 DIAGNOSIS — Z6829 Body mass index (BMI) 29.0-29.9, adult: Secondary | ICD-10-CM | POA: Diagnosis not present

## 2019-09-27 DIAGNOSIS — E1142 Type 2 diabetes mellitus with diabetic polyneuropathy: Secondary | ICD-10-CM | POA: Diagnosis not present

## 2019-09-27 DIAGNOSIS — Z9114 Patient's other noncompliance with medication regimen: Secondary | ICD-10-CM | POA: Diagnosis not present

## 2019-09-27 DIAGNOSIS — E1151 Type 2 diabetes mellitus with diabetic peripheral angiopathy without gangrene: Secondary | ICD-10-CM | POA: Diagnosis not present

## 2019-09-27 DIAGNOSIS — Z20822 Contact with and (suspected) exposure to covid-19: Secondary | ICD-10-CM | POA: Diagnosis not present

## 2019-09-27 DIAGNOSIS — I472 Ventricular tachycardia: Secondary | ICD-10-CM | POA: Diagnosis not present

## 2019-09-27 DIAGNOSIS — E876 Hypokalemia: Secondary | ICD-10-CM | POA: Diagnosis not present

## 2019-09-27 DIAGNOSIS — I70219 Atherosclerosis of native arteries of extremities with intermittent claudication, unspecified extremity: Secondary | ICD-10-CM | POA: Diagnosis not present

## 2019-09-27 DIAGNOSIS — E119 Type 2 diabetes mellitus without complications: Secondary | ICD-10-CM | POA: Diagnosis not present

## 2019-09-27 DIAGNOSIS — N183 Chronic kidney disease, stage 3 unspecified: Secondary | ICD-10-CM | POA: Diagnosis not present

## 2019-09-27 DIAGNOSIS — E039 Hypothyroidism, unspecified: Secondary | ICD-10-CM | POA: Diagnosis not present

## 2019-09-27 DIAGNOSIS — Z7982 Long term (current) use of aspirin: Secondary | ICD-10-CM | POA: Diagnosis not present

## 2019-09-27 DIAGNOSIS — Z87891 Personal history of nicotine dependence: Secondary | ICD-10-CM | POA: Diagnosis not present

## 2019-09-27 DIAGNOSIS — Z794 Long term (current) use of insulin: Secondary | ICD-10-CM | POA: Diagnosis not present

## 2019-09-27 DIAGNOSIS — Z89429 Acquired absence of other toe(s), unspecified side: Secondary | ICD-10-CM | POA: Diagnosis not present

## 2019-09-27 DIAGNOSIS — Z951 Presence of aortocoronary bypass graft: Secondary | ICD-10-CM | POA: Diagnosis not present

## 2019-09-27 DIAGNOSIS — E1122 Type 2 diabetes mellitus with diabetic chronic kidney disease: Secondary | ICD-10-CM | POA: Diagnosis not present

## 2019-10-06 DIAGNOSIS — M1990 Unspecified osteoarthritis, unspecified site: Secondary | ICD-10-CM

## 2019-10-06 DIAGNOSIS — M159 Polyosteoarthritis, unspecified: Secondary | ICD-10-CM

## 2019-10-06 DIAGNOSIS — M150 Primary generalized (osteo)arthritis: Secondary | ICD-10-CM

## 2019-10-06 DIAGNOSIS — N1832 Chronic kidney disease, stage 3b: Secondary | ICD-10-CM | POA: Diagnosis not present

## 2019-10-06 DIAGNOSIS — I130 Hypertensive heart and chronic kidney disease with heart failure and stage 1 through stage 4 chronic kidney disease, or unspecified chronic kidney disease: Secondary | ICD-10-CM | POA: Diagnosis not present

## 2019-10-06 DIAGNOSIS — I5022 Chronic systolic (congestive) heart failure: Secondary | ICD-10-CM | POA: Diagnosis not present

## 2019-10-06 DIAGNOSIS — M8949 Other hypertrophic osteoarthropathy, multiple sites: Secondary | ICD-10-CM | POA: Diagnosis not present

## 2019-10-06 DIAGNOSIS — Z9119 Patient's noncompliance with other medical treatment and regimen: Secondary | ICD-10-CM | POA: Diagnosis not present

## 2019-10-06 DIAGNOSIS — D638 Anemia in other chronic diseases classified elsewhere: Secondary | ICD-10-CM | POA: Diagnosis not present

## 2019-10-06 HISTORY — DX: Polyosteoarthritis, unspecified: M15.9

## 2019-10-06 HISTORY — DX: Primary generalized (osteo)arthritis: M15.0

## 2019-10-06 HISTORY — DX: Other hypertrophic osteoarthropathy, multiple sites: M89.49

## 2019-10-06 HISTORY — DX: Unspecified osteoarthritis, unspecified site: M19.90

## 2019-10-15 ENCOUNTER — Ambulatory Visit: Payer: PPO | Admitting: Cardiology

## 2019-10-15 ENCOUNTER — Encounter: Payer: Self-pay | Admitting: Cardiology

## 2019-10-15 ENCOUNTER — Other Ambulatory Visit: Payer: Self-pay

## 2019-10-15 VITALS — BP 140/80 | HR 74 | Ht 72.0 in | Wt 218.8 lb

## 2019-10-15 DIAGNOSIS — I10 Essential (primary) hypertension: Secondary | ICD-10-CM | POA: Diagnosis not present

## 2019-10-15 DIAGNOSIS — I5023 Acute on chronic systolic (congestive) heart failure: Secondary | ICD-10-CM

## 2019-10-15 DIAGNOSIS — I2510 Atherosclerotic heart disease of native coronary artery without angina pectoris: Secondary | ICD-10-CM | POA: Diagnosis not present

## 2019-10-15 DIAGNOSIS — I350 Nonrheumatic aortic (valve) stenosis: Secondary | ICD-10-CM

## 2019-10-15 HISTORY — DX: Nonrheumatic aortic (valve) stenosis: I35.0

## 2019-10-15 MED ORDER — METOLAZONE 2.5 MG PO TABS
2.5000 mg | ORAL_TABLET | Freq: Every day | ORAL | 3 refills | Status: DC
Start: 2019-10-15 — End: 2019-11-15

## 2019-10-15 NOTE — Progress Notes (Signed)
Cardiology Office Note:    Date:  10/15/2019   ID:  Phillip [name], DOB January 08, 1956, MRN [id number]  PCP:  Raina Mina., MD  Cardiologist:  Jenean Lindau, MD   Referring MD: Raina Mina., MD    ASSESSMENT:    1. Acute on chronic systolic (congestive) heart failure ([place])   2. Benign essential hypertension   3. Coronary artery disease involving native coronary artery of native heart without angina pectoris   4. Aortic stenosis, moderate    PLAN:    In order of problems listed above:  1. Secondary prevention stressed with the patient.  Importance of compliance with diet and medication stressed and he vocalized understanding. 2. Dyspnea on exertion: Valvular heart disease is unlikely as patient has only mild to moderate aortic stenosis.  In view of this I would like to do a coronary artery disease evaluation with Lexiscan sestamibi.  He said coronary artery disease with bypass surgery in the past.  He is agreeable for this. 3. Congestive heart failure-like symptoms: Bilateral pedal edema: DVT study was negative the last time I did discuss the report with him.  In view of this I will have Chem-7 today we will switch his diuretic to metolazone 2.5 mg daily he will be back in 1 week for a Chem-7.  CHF education was given diet was emphasized especially with respect to salt.  He understands. 4. Essential hypertension: Blood pressure stable Mixed dyslipidemia: Lipids followed by primary care physician Diabetes mellitus: Managed by primary care physician and diet was emphasized. Follow-up appointment in a month or earlier if he has any concerns.  He knows to go to nearest emergency room for any concerning symptoms.   Medication Adjustments/Labs and Tests Ordered: Current medicines are reviewed at length with the patient today.  Concerns regarding medicines are outlined above.  No orders of the defined types were placed in this encounter.  No orders of the defined types were placed  in this encounter.    Chief Complaint  Patient presents with  . Follow-up    SOB / Bilateral Leg Edema      History of Present Illness:    Phillip [name] is a 64 y.o. male.  Patient has past medical history of coronary artery disease, essential hypertension, dyslipidemia and diabetes mellitus, bypass surgery in the past.  He has moderate aortic stenosis by recent echocardiogram.  The patient mentions to me that he has shortness of breath on exertion echocardiogram has revealed mild to moderate aortic stenosis.  No chest pain no syncope.  He has bilateral pedal edema which continues to be a problem for him.  At the time of my evaluation, the patient is alert awake oriented and in no distress.  Past Medical History:  Diagnosis Date  . Asthma   . CHF (congestive heart failure) ([place])   . Coronary artery disease   . Diabetes mellitus without complication ([place])   . Foot ulcer ([place])    left foot  . GERD (gastroesophageal reflux disease)   . Heart murmur   . Hypertension   . Kidney insufficiency   . Peripheral vascular disease [facility])     Past Surgical History:  Procedure Laterality Date  . AMPUTATION TOE Left 03/13/2018   Procedure: AMPUTATION TOE-MPJ;  Surgeon: Samara Deist, DPM;  Location: [facility] ORS;  Service: Podiatry;  Laterality: Left;  . ANGIOPLASTY    . CARDIAC CATHETERIZATION    . CARDIAC SURGERY     heart bypass (4)  .  CORONARY ARTERY BYPASS GRAFT    . LAPAROSCOPIC APPENDECTOMY N/A 09/08/2018   Procedure: APPENDECTOMY LAPAROSCOPIC;  Surgeon: Jules Husbands, MD;  Location: [facility] ORS;  Service: General;  Laterality: N/A;  . LOWER EXTREMITY ANGIOGRAPHY Left 03/02/2018   Procedure: LOWER EXTREMITY ANGIOGRAPHY;  Surgeon: Algernon Huxley, MD;  Location: [place] [facility];  Service: Cardiovascular;  Laterality: Left;  . ROTATOR CUFF REPAIR Left     Current Medications: Current Meds  Medication Sig  . acetaminophen (TYLENOL) 500 MG tablet Take 1,000 mg by mouth every 6 (six)  hours as needed for moderate pain or headache.   Marland Kitchen aspirin 81 MG tablet Take 81 mg by mouth daily.  . carvedilol (COREG) 6.25 MG tablet Take 6.25 mg by mouth 2 (two) times daily with a meal.   . cloNIDine (CATAPRES) 0.3 MG tablet Take 0.3 mg by mouth 2 (two) times daily.   . clopidogrel (PLAVIX) 75 MG tablet Take 1 tablet (75 mg total) by mouth daily.  . furosemide (LASIX) 40 MG tablet Take 1 tablet by mouth twice daily  . gabapentin (NEURONTIN) 600 MG tablet Take 600 mg by mouth 2 (two) times daily.   . isosorbide mononitrate (IMDUR) 120 MG 24 hr tablet Take 120 mg by mouth daily.  Marland Kitchen LEVEMIR FLEXTOUCH 100 UNIT/ML Pen USE AS DIRECTED ONLY STARTING AT 10 UNITS AT NIGHT. MAXIMUM DOSE 50 UNITS DAILY  . levothyroxine (SYNTHROID) 25 MCG tablet Take 25 mcg by mouth daily.   Marland Kitchen lisinopril (ZESTRIL) 40 MG tablet Take 40 mg by mouth daily.  Marland Kitchen lovastatin (MEVACOR) 40 MG tablet Take 0.5 tablets (20 mg total) by mouth at bedtime.  . metolazone (ZAROXOLYN) 5 MG tablet Take 5 mg by mouth daily.  . nitroGLYCERIN (NITROSTAT) 0.4 MG SL tablet Place 0.4 mg under the tongue every 5 (five) minutes as needed for chest pain.   . vitamin B-12 (CYANOCOBALAMIN) 500 MCG tablet Take by mouth.     Allergies:   Patient has no known allergies.   Social History   Socioeconomic History  . Marital status: Married    Spouse name: Not on file  . Number of children: Not on file  . Years of education: Not on file  . Highest education level: Not on file  Occupational History  . Not on file  Tobacco Use  . Smoking status: Former Smoker    Quit date: 2003    Years since quitting: 18.3  . Smokeless tobacco: Never Used  Substance and Sexual Activity  . Alcohol use: Not Currently    Comment: 2003  . Drug use: Not Currently    Types: Marijuana  . Sexual activity: Not on file  Other Topics Concern  . Not on file  Social History Narrative  . Not on file   Social Determinants of Health   Financial Resource Strain:     . Difficulty of Paying Living Expenses:   Food Insecurity:   . Worried About Charity fundraiser in the Last Year:   . Arboriculturist in the Last Year:   Transportation Needs:   . Film/video editor (Medical):   Marland Kitchen Lack of Transportation (Non-Medical):   Physical Activity:   . Days of Exercise per Week:   . Minutes of Exercise per Session:   Stress:   . Feeling of Stress :   Social Connections:   . Frequency of Communication with Friends and Family:   . Frequency of Social Gatherings with Friends and Family:   .  Attends Religious Services:   . Active Member of Clubs or Organizations:   . Attends Archivist Meetings:   Marland Kitchen Marital Status:      Family History: The patient's family history includes Diabetes in his mother; Heart attack in his brother; Heart disease in his brother; Hyperlipidemia in his mother; Hypertension in his mother; Lung disease in his father.  ROS:   Please see the history of present illness.    All other systems reviewed and are negative.  EKGs/Labs/Other Studies Reviewed:    The following studies were reviewed today: IMPRESSIONS    1. The left ventricle has low normal systolic function, with an ejection  fraction of 50-55%. The cavity size was normal. There is moderate  concentric left ventricular hypertrophy. Left ventricular diastolic  Doppler parameters are consistent with  impaired relaxation.  2. The right ventricle has normal systolic function. The cavity was  mildly enlarged. There is no increase in right ventricular wall thickness.  3. Left atrial size was mildly dilated.  4. Right atrial size was mildly dilated.  5. The tricuspid valve is not well visualized. Tricuspid valve  regurgitation is mild-moderate.  6. The aortic valve is abnormal. Moderate thickening of the aortic valve.  Moderate calcification of the aortic valve. Aortic valve regurgitation is  trivial by color flow Doppler. Mild-moderate stenosis of the  aortic valve.  7. The interatrial septum was not assessed.    Recent Labs: 08/10/2019: ALT 8; BNP 840.1; BUN 47; Creatinine, Ser 2.53; Hemoglobin 11.6; Platelets 143; Potassium 4.9; Sodium 146; TSH 2.950  Recent Lipid Panel    Component Value Date/Time   CHOL 77 (L) 08/10/2019 1043   TRIG 65 08/10/2019 1043   HDL 25 (L) 08/10/2019 1043   CHOLHDL 3.1 08/10/2019 1043   LDLCALC 37 08/10/2019 1043    Physical Exam:    VS:  BP 140/80   Pulse 74   Ht 6' (1.829 m)   Wt 218 lb 12.8 oz (99.2 kg)   SpO2 95%   BMI 29.67 kg/m     Wt Readings from Last 3 Encounters:  10/15/19 218 lb 12.8 oz (99.2 kg)  08/10/19 217 lb (98.4 kg)  05/12/19 218 lb 12.8 oz (99.2 kg)     GEN: Patient is in no acute distress HEENT: Normal NECK: No JVD; No carotid bruits LYMPHATICS: No lymphadenopathy CARDIAC: Hear sounds regular, 2/6 systolic murmur at the apex. RESPIRATORY:  Clear to auscultation without rales, wheezing or rhonchi  ABDOMEN: Soft, non-tender, non-distended MUSCULOSKELETAL:  No edema; No deformity  SKIN: Warm and dry NEUROLOGIC:  Alert and oriented x 3 PSYCHIATRIC:  Normal affect   Signed, Jenean Lindau, MD  10/15/2019 2:32 PM    [facility]

## 2019-10-15 NOTE — Patient Instructions (Signed)
Medication Instructions:  Your physician has recommended you make the following change in your medication: Stop furosemide. Start metalazone 2.5 mg daily. Take 1/2 tablet of current dose.   *If you need a refill on your cardiac medications before your next appointment, please call your pharmacy*   Lab Work: Your physician recommends that you have a BMET today and then again in 1 week.   You can come Monday through Friday 8:30 am to 12:00 pm and 1:15 to 4:30. You do not need to make an appointment as the order has already been placed. .   If you have labs (blood work) drawn today and your tests are completely normal, you will receive your results only by: Marland Kitchen MyChart Message (if you have MyChart) OR . A paper copy in the mail If you have any lab test that is abnormal or we need to change your treatment, we will call you to review the results.   Testing/Procedures: Your physician has requested that you have a lexiscan myoview. For further information please visit [email or web address]. Please follow instruction sheet, as given.  The test will take approximately 3 to 4 hours to complete; you may bring reading material.  If someone comes with you to your appointment, they will need to remain in the main lobby due to limited space in the testing area.  How to prepare for your Myocardial Perfusion Test: . Do not eat or drink 3 hours prior to your test, except you may have water. . Do not consume products containing caffeine (regular or decaffeinated) 12 hours prior to your test. (ex: coffee, chocolate, sodas, tea). . Do bring a list of your current medications with you.  If not listed below, you may take your medications as normal. . Do wear comfortable clothes (no dresses or overalls) and walking shoes, tennis shoes preferred (No heels or open toe shoes are allowed). . Do NOT wear cologne, perfume, aftershave, or lotions (deodorant is allowed). . If these instructions are not followed, your test  will have to be rescheduled.    Follow-Up: At [facility], you and your health needs are our priority.  As part of our continuing mission to provide you with exceptional heart care, we have created designated Provider Care Teams.  These Care Teams include your primary Cardiologist (physician) and Advanced Practice Providers (APPs -  Physician Assistants and Nurse Practitioners) who all work together to provide you with the care you need, when you need it.  We recommend signing up for the patient portal called "MyChart".  Sign up information is provided on this After Visit Summary.  MyChart is used to connect with patients for Virtual Visits (Telemedicine).  Patients are able to view lab/test results, encounter notes, upcoming appointments, etc.  Non-urgent messages can be sent to your provider as well.   To learn more about what you can do with MyChart, go to [email or web address].    Your next appointment:   1 month(s)  The format for your next appointment:   In Person  Provider:   Jyl Heinz, MD   Other Instructions NA

## 2019-10-15 NOTE — Addendum Note (Signed)
Addended by: Margot Ables on: 10/15/2019 03:39 PM   Modules accepted: Orders

## 2019-10-16 LAB — BASIC METABOLIC PANEL
BUN/Creatinine Ratio: 20 (ref 10–24)
BUN: 47 mg/dL — ABNORMAL HIGH (ref 8–27)
CO2: 23 mmol/L (ref 20–29)
Calcium: 8.9 mg/dL (ref 8.6–10.2)
Chloride: 106 mmol/L (ref 96–106)
Creatinine, Ser: 2.38 mg/dL — ABNORMAL HIGH (ref 0.76–1.27)
GFR calc Af Amer: 32 mL/min/{1.73_m2} — ABNORMAL LOW (ref >59)
GFR calc non Af Amer: 28 mL/min/{1.73_m2} — ABNORMAL LOW (ref >59)
Glucose: 196 mg/dL — ABNORMAL HIGH (ref 65–99)
Potassium: 4.3 mmol/L (ref 3.5–5.2)
Sodium: 144 mmol/L (ref 134–144)

## 2019-10-20 ENCOUNTER — Telehealth (HOSPITAL_COMMUNITY): Payer: Self-pay | Admitting: *Deleted

## 2019-10-20 NOTE — Telephone Encounter (Signed)
Left message on voicemail in reference to upcoming appointment scheduled for 10/27/19. Phone number given for a call back so details instructions can be given.  Phillip [name]

## 2019-10-21 ENCOUNTER — Telehealth (HOSPITAL_COMMUNITY): Payer: Self-pay | Admitting: *Deleted

## 2019-10-21 NOTE — Telephone Encounter (Signed)
Patient given detailed instructions per Myocardial Perfusion Study Information Sheet for the test on 10/27/19 at 8:15. Patient notified to arrive 15 minutes early and that it is imperative to arrive on time for appointment to keep from having the test rescheduled.  If you need to cancel or reschedule your appointment, please call the office within 24 hours of your appointment. . Patient verbalized understanding.Phillip [name]

## 2019-10-22 DIAGNOSIS — I5023 Acute on chronic systolic (congestive) heart failure: Secondary | ICD-10-CM | POA: Diagnosis not present

## 2019-10-22 DIAGNOSIS — I10 Essential (primary) hypertension: Secondary | ICD-10-CM | POA: Diagnosis not present

## 2019-10-22 DIAGNOSIS — I2510 Atherosclerotic heart disease of native coronary artery without angina pectoris: Secondary | ICD-10-CM | POA: Diagnosis not present

## 2019-10-22 LAB — BASIC METABOLIC PANEL
BUN/Creatinine Ratio: 21 (ref 10–24)
BUN: 52 mg/dL — ABNORMAL HIGH (ref 8–27)
CO2: 21 mmol/L (ref 20–29)
Calcium: 8.7 mg/dL (ref 8.6–10.2)
Chloride: 107 mmol/L — ABNORMAL HIGH (ref 96–106)
Creatinine, Ser: 2.42 mg/dL — ABNORMAL HIGH (ref 0.76–1.27)
GFR calc Af Amer: 31 mL/min/{1.73_m2} — ABNORMAL LOW (ref >59)
GFR calc non Af Amer: 27 mL/min/{1.73_m2} — ABNORMAL LOW (ref >59)
Glucose: 165 mg/dL — ABNORMAL HIGH (ref 65–99)
Potassium: 4.2 mmol/L (ref 3.5–5.2)
Sodium: 138 mmol/L (ref 134–144)

## 2019-10-27 ENCOUNTER — Ambulatory Visit (INDEPENDENT_AMBULATORY_CARE_PROVIDER_SITE_OTHER): Payer: PPO

## 2019-10-27 ENCOUNTER — Other Ambulatory Visit: Payer: Self-pay

## 2019-10-27 VITALS — Ht 72.0 in | Wt 218.0 lb

## 2019-10-27 DIAGNOSIS — I7025 Atherosclerosis of native arteries of other extremities with ulceration: Secondary | ICD-10-CM | POA: Diagnosis not present

## 2019-10-27 DIAGNOSIS — I10 Essential (primary) hypertension: Secondary | ICD-10-CM

## 2019-10-27 DIAGNOSIS — I739 Peripheral vascular disease, unspecified: Secondary | ICD-10-CM

## 2019-10-27 DIAGNOSIS — I5023 Acute on chronic systolic (congestive) heart failure: Secondary | ICD-10-CM

## 2019-10-27 LAB — MYOCARDIAL PERFUSION IMAGING
LV dias vol: 232 mL (ref 62–150)
LV sys vol: 178 mL
Peak HR: 69 {beats}/min
Rest HR: 64 {beats}/min
SDS: 1
SRS: 17
SSS: 18
TID: 0.97

## 2019-10-27 MED ORDER — REGADENOSON 0.4 MG/5ML IV SOLN
0.4000 mg | Freq: Once | INTRAVENOUS | Status: AC
  Administered 2019-10-27: 0.4 mg via INTRAVENOUS

## 2019-10-27 MED ORDER — TECHNETIUM TC 99M TETROFOSMIN IV KIT
10.8000 | PACK | Freq: Once | INTRAVENOUS | Status: AC | PRN
Start: 2019-10-27 — End: 2019-10-27
  Administered 2019-10-27: 10.8 via INTRAVENOUS

## 2019-10-27 MED ORDER — TECHNETIUM TC 99M TETROFOSMIN IV KIT
30.6000 | PACK | Freq: Once | INTRAVENOUS | Status: AC | PRN
  Administered 2019-10-27: 30.6 via INTRAVENOUS

## 2019-10-28 ENCOUNTER — Telehealth: Payer: Self-pay

## 2019-10-28 DIAGNOSIS — I5043 Acute on chronic combined systolic (congestive) and diastolic (congestive) heart failure: Secondary | ICD-10-CM | POA: Diagnosis not present

## 2019-10-28 DIAGNOSIS — I252 Old myocardial infarction: Secondary | ICD-10-CM | POA: Diagnosis not present

## 2019-10-28 DIAGNOSIS — Z20822 Contact with and (suspected) exposure to covid-19: Secondary | ICD-10-CM | POA: Diagnosis not present

## 2019-10-28 DIAGNOSIS — I2510 Atherosclerotic heart disease of native coronary artery without angina pectoris: Secondary | ICD-10-CM | POA: Diagnosis not present

## 2019-10-28 DIAGNOSIS — Z89429 Acquired absence of other toe(s), unspecified side: Secondary | ICD-10-CM | POA: Diagnosis not present

## 2019-10-28 DIAGNOSIS — R7989 Other specified abnormal findings of blood chemistry: Secondary | ICD-10-CM | POA: Diagnosis not present

## 2019-10-28 DIAGNOSIS — E1122 Type 2 diabetes mellitus with diabetic chronic kidney disease: Secondary | ICD-10-CM | POA: Diagnosis not present

## 2019-10-28 DIAGNOSIS — Z951 Presence of aortocoronary bypass graft: Secondary | ICD-10-CM | POA: Diagnosis not present

## 2019-10-28 DIAGNOSIS — Z794 Long term (current) use of insulin: Secondary | ICD-10-CM | POA: Diagnosis not present

## 2019-10-28 DIAGNOSIS — I110 Hypertensive heart disease with heart failure: Secondary | ICD-10-CM | POA: Diagnosis not present

## 2019-10-28 DIAGNOSIS — Z87891 Personal history of nicotine dependence: Secondary | ICD-10-CM | POA: Diagnosis not present

## 2019-10-28 DIAGNOSIS — Z6828 Body mass index (BMI) 28.0-28.9, adult: Secondary | ICD-10-CM | POA: Diagnosis not present

## 2019-10-28 DIAGNOSIS — E1140 Type 2 diabetes mellitus with diabetic neuropathy, unspecified: Secondary | ICD-10-CM | POA: Diagnosis not present

## 2019-10-28 DIAGNOSIS — E1142 Type 2 diabetes mellitus with diabetic polyneuropathy: Secondary | ICD-10-CM | POA: Diagnosis not present

## 2019-10-28 DIAGNOSIS — Z7982 Long term (current) use of aspirin: Secondary | ICD-10-CM | POA: Diagnosis not present

## 2019-10-28 DIAGNOSIS — Z7902 Long term (current) use of antithrombotics/antiplatelets: Secondary | ICD-10-CM | POA: Diagnosis not present

## 2019-10-28 DIAGNOSIS — Z9119 Patient's noncompliance with other medical treatment and regimen: Secondary | ICD-10-CM | POA: Diagnosis not present

## 2019-10-28 DIAGNOSIS — Z79899 Other long term (current) drug therapy: Secondary | ICD-10-CM | POA: Diagnosis not present

## 2019-10-28 DIAGNOSIS — R339 Retention of urine, unspecified: Secondary | ICD-10-CM | POA: Diagnosis not present

## 2019-10-28 DIAGNOSIS — I248 Other forms of acute ischemic heart disease: Secondary | ICD-10-CM | POA: Diagnosis not present

## 2019-10-28 DIAGNOSIS — E1151 Type 2 diabetes mellitus with diabetic peripheral angiopathy without gangrene: Secondary | ICD-10-CM | POA: Diagnosis not present

## 2019-10-28 DIAGNOSIS — N183 Chronic kidney disease, stage 3 unspecified: Secondary | ICD-10-CM | POA: Diagnosis not present

## 2019-10-28 DIAGNOSIS — R079 Chest pain, unspecified: Secondary | ICD-10-CM | POA: Diagnosis not present

## 2019-10-28 DIAGNOSIS — I509 Heart failure, unspecified: Secondary | ICD-10-CM | POA: Diagnosis not present

## 2019-10-28 DIAGNOSIS — I130 Hypertensive heart and chronic kidney disease with heart failure and stage 1 through stage 4 chronic kidney disease, or unspecified chronic kidney disease: Secondary | ICD-10-CM | POA: Diagnosis not present

## 2019-10-28 NOTE — Telephone Encounter (Signed)
Please send him to [facility] for 40 mg IV Lasix tomorrow.  Then he can double his oral Lasix.  Also send him in for subcutaneous Lasix with CHF clinic with our colleagues in [place].

## 2019-10-28 NOTE — Telephone Encounter (Signed)
Pt's wife states that the pt is very miserable and that he may go to [employer]. I told the the plan and that I will call her in the AM to see how he is doing. She verbalized understanding and had no additional questions.

## 2019-10-28 NOTE — Telephone Encounter (Signed)
While providing the pt with his recent labs the pts wife said that the pt has gained from 218 5/14 to 529 today. Pt's wife states that his legs are very swollen. Pt's wife states that in the past he was sent to [facility] for outpatient IV Lasix.  How do you advise?

## 2019-10-29 MED ORDER — CARVEDILOL 6.25 MG PO TABS
6.25 | ORAL_TABLET | ORAL | Status: DC
Start: 2019-11-01 — End: 2019-10-29

## 2019-10-29 MED ORDER — LEVOTHYROXINE SODIUM 25 MCG PO TABS
25.00 | ORAL_TABLET | ORAL | Status: DC
Start: 2019-11-02 — End: 2019-10-29

## 2019-10-29 MED ORDER — LISINOPRIL 40 MG PO TABS
40.00 | ORAL_TABLET | ORAL | Status: DC
Start: 2019-11-02 — End: 2019-10-29

## 2019-10-29 MED ORDER — INSULIN GLARGINE 100 UNIT/ML SOLOSTAR PEN
10.00 | PEN_INJECTOR | SUBCUTANEOUS | Status: DC
Start: 2019-10-29 — End: 2019-10-29

## 2019-10-29 MED ORDER — ONDANSETRON HCL 4 MG/2ML IJ SOLN
4.00 | INTRAMUSCULAR | Status: DC
Start: ? — End: 2019-10-29

## 2019-10-29 MED ORDER — CLOPIDOGREL BISULFATE 75 MG PO TABS
75.00 | ORAL_TABLET | ORAL | Status: DC
Start: 2019-11-02 — End: 2019-10-29

## 2019-10-29 MED ORDER — GABAPENTIN 300 MG PO CAPS
600.00 | ORAL_CAPSULE | ORAL | Status: DC
Start: 2019-11-02 — End: 2019-10-29

## 2019-10-29 MED ORDER — ACETAMINOPHEN 325 MG PO TABS
650.00 | ORAL_TABLET | ORAL | Status: DC
Start: ? — End: 2019-10-29

## 2019-10-29 MED ORDER — PRAVASTATIN SODIUM 40 MG PO TABS
40.00 | ORAL_TABLET | ORAL | Status: DC
Start: 2019-11-01 — End: 2019-10-29

## 2019-10-29 MED ORDER — ASPIRIN 81 MG PO TBEC
81.00 | DELAYED_RELEASE_TABLET | ORAL | Status: DC
Start: 2019-11-02 — End: 2019-10-29

## 2019-10-29 MED ORDER — ISOSORBIDE MONONITRATE ER 60 MG PO TB24
120.00 | ORAL_TABLET | ORAL | Status: DC
Start: 2019-11-02 — End: 2019-10-29

## 2019-10-29 MED ORDER — CLONIDINE HCL 0.1 MG PO TABS
0.30 | ORAL_TABLET | ORAL | Status: DC
Start: 2019-11-01 — End: 2019-10-29

## 2019-10-29 MED ORDER — ENOXAPARIN SODIUM 30 MG/0.3ML ~~LOC~~ SOLN
30.00 | SUBCUTANEOUS | Status: DC
Start: 2019-11-02 — End: 2019-10-29

## 2019-10-29 MED ORDER — FUROSEMIDE 10 MG/ML IJ SOLN
60.00 | INTRAMUSCULAR | Status: DC
Start: 2019-10-30 — End: 2019-10-29

## 2019-10-29 MED ORDER — DEXTROSE 50 % IV SOLN
12.50 | INTRAVENOUS | Status: DC
Start: ? — End: 2019-10-29

## 2019-10-29 MED ORDER — INSULIN LISPRO 100 UNIT/ML ~~LOC~~ SOLN
0.00 | SUBCUTANEOUS | Status: DC
Start: 2019-11-01 — End: 2019-10-29

## 2019-10-29 MED ORDER — HYDRALAZINE HCL 20 MG/ML IJ SOLN
10.00 | INTRAMUSCULAR | Status: DC
Start: ? — End: 2019-10-29

## 2019-11-03 MED ORDER — INSULIN GLARGINE 100 UNIT/ML SOLOSTAR PEN
14.00 | PEN_INJECTOR | SUBCUTANEOUS | Status: DC
Start: 2019-11-01 — End: 2019-11-03

## 2019-11-03 MED ORDER — BUMETANIDE 1 MG PO TABS
1.00 | ORAL_TABLET | ORAL | Status: DC
Start: 2019-11-01 — End: 2019-11-03

## 2019-11-10 DIAGNOSIS — M8949 Other hypertrophic osteoarthropathy, multiple sites: Secondary | ICD-10-CM | POA: Diagnosis not present

## 2019-11-10 DIAGNOSIS — N1832 Chronic kidney disease, stage 3b: Secondary | ICD-10-CM | POA: Diagnosis not present

## 2019-11-10 DIAGNOSIS — E782 Mixed hyperlipidemia: Secondary | ICD-10-CM | POA: Diagnosis not present

## 2019-11-10 DIAGNOSIS — I739 Peripheral vascular disease, unspecified: Secondary | ICD-10-CM | POA: Diagnosis not present

## 2019-11-10 DIAGNOSIS — I25118 Atherosclerotic heart disease of native coronary artery with other forms of angina pectoris: Secondary | ICD-10-CM | POA: Diagnosis not present

## 2019-11-10 DIAGNOSIS — E039 Hypothyroidism, unspecified: Secondary | ICD-10-CM | POA: Diagnosis not present

## 2019-11-10 DIAGNOSIS — N183 Chronic kidney disease, stage 3 unspecified: Secondary | ICD-10-CM | POA: Diagnosis not present

## 2019-11-10 DIAGNOSIS — I5022 Chronic systolic (congestive) heart failure: Secondary | ICD-10-CM | POA: Diagnosis not present

## 2019-11-10 DIAGNOSIS — R5381 Other malaise: Secondary | ICD-10-CM | POA: Diagnosis not present

## 2019-11-10 DIAGNOSIS — I129 Hypertensive chronic kidney disease with stage 1 through stage 4 chronic kidney disease, or unspecified chronic kidney disease: Secondary | ICD-10-CM | POA: Diagnosis not present

## 2019-11-10 DIAGNOSIS — Z794 Long term (current) use of insulin: Secondary | ICD-10-CM | POA: Diagnosis not present

## 2019-11-10 DIAGNOSIS — E1122 Type 2 diabetes mellitus with diabetic chronic kidney disease: Secondary | ICD-10-CM | POA: Diagnosis not present

## 2019-11-10 DIAGNOSIS — E538 Deficiency of other specified B group vitamins: Secondary | ICD-10-CM | POA: Diagnosis not present

## 2019-11-15 ENCOUNTER — Encounter: Payer: Self-pay | Admitting: Cardiology

## 2019-11-15 ENCOUNTER — Other Ambulatory Visit: Payer: Self-pay

## 2019-11-15 ENCOUNTER — Ambulatory Visit: Payer: PPO | Admitting: Cardiology

## 2019-11-15 VITALS — BP 148/70 | HR 64 | Ht 72.0 in | Wt 212.8 lb

## 2019-11-15 DIAGNOSIS — I350 Nonrheumatic aortic (valve) stenosis: Secondary | ICD-10-CM | POA: Diagnosis not present

## 2019-11-15 DIAGNOSIS — I10 Essential (primary) hypertension: Secondary | ICD-10-CM | POA: Diagnosis not present

## 2019-11-15 DIAGNOSIS — Z794 Long term (current) use of insulin: Secondary | ICD-10-CM | POA: Diagnosis not present

## 2019-11-15 DIAGNOSIS — I2510 Atherosclerotic heart disease of native coronary artery without angina pectoris: Secondary | ICD-10-CM

## 2019-11-15 DIAGNOSIS — I129 Hypertensive chronic kidney disease with stage 1 through stage 4 chronic kidney disease, or unspecified chronic kidney disease: Secondary | ICD-10-CM | POA: Diagnosis not present

## 2019-11-15 DIAGNOSIS — N184 Chronic kidney disease, stage 4 (severe): Secondary | ICD-10-CM

## 2019-11-15 DIAGNOSIS — E1122 Type 2 diabetes mellitus with diabetic chronic kidney disease: Secondary | ICD-10-CM | POA: Diagnosis not present

## 2019-11-15 NOTE — Patient Instructions (Signed)
Medication Instructions:  No medication changes. *If you need a refill on your cardiac medications before your next appointment, please call your pharmacy*   Lab Work: None ordered If you have labs (blood work) drawn today and your tests are completely normal, you will receive your results only by: . MyChart Message (if you have MyChart) OR . A paper copy in the mail If you have any lab test that is abnormal or we need to change your treatment, we will call you to review the results.   Testing/Procedures: None ordered   Follow-Up: At [facility], you and your health needs are our priority.  As part of our continuing mission to provide you with exceptional heart care, we have created designated Provider Care Teams.  These Care Teams include your primary Cardiologist (physician) and Advanced Practice Providers (APPs -  Physician Assistants and Nurse Practitioners) who all work together to provide you with the care you need, when you need it.  We recommend signing up for the patient portal called "MyChart".  Sign up information is provided on this After Visit Summary.  MyChart is used to connect with patients for Virtual Visits (Telemedicine).  Patients are able to view lab/test results, encounter notes, upcoming appointments, etc.  Non-urgent messages can be sent to your provider as well.   To learn more about what you can do with MyChart, go to [email or web address].    Your next appointment:   3 month(s)  The format for your next appointment:   In Person  Provider:   Rajan Revankar, MD   Other Instructions NA  

## 2019-11-15 NOTE — Progress Notes (Signed)
Cardiology Office Note:    Date:  11/15/2019   ID:  DOY TAAFFE, DOB Nov 15, 1955, MRN [id number]  PCP:  Raina Mina., MD  Cardiologist:  Jenean Lindau, MD   Referring MD: Raina Mina., MD    ASSESSMENT:    No diagnosis found. PLAN:    In order of problems listed above:  1. Secondary prevention stressed with the patient.  Importance of compliance with diet medication stressed and he vocalized understanding. 2. Congestive heart failure: Stable and well compensated at this time.  He is taking his medications appropriately.  I educated him the importance of taking medications regularly and weighing himself on a regular basis.  Fluid intake issues were discussed extensively.  I told him to also be in close monitor by his nephrologist and he agrees 3. Essential hypertension: Blood pressure stable 4. Patient will be seen in follow-up appointment in 3 months or earlier if the patient has any concerns    Medication Adjustments/Labs and Tests Ordered: Current medicines are reviewed at length with the patient today.  Concerns regarding medicines are outlined above.  No orders of the defined types were placed in this encounter.  No orders of the defined types were placed in this encounter.    No chief complaint on file.    History of Present Illness:    Phillip [name] is a 64 y.o. male.  Patient has past medical history of congestive heart failure, essential hypertension diabetes mellitus and dyslipidemia.  His ejection fraction was preserved by last echocardiogram.  He was admitted to [facility] with congestive heart failure.  Patient mentions to me that he was treated and released.  He asked that once renal insufficiency and saw a nephrologist recently.  Currently is feeling fine and is managing his fluids and salt intake and such issues appropriately.  He weighs himself on a regular basis.  At the time of my evaluation, the patient is alert awake oriented and in no  distress.  Past Medical History:  Diagnosis Date  . Asthma   . CHF (congestive heart failure) ([place])   . Coronary artery disease   . Diabetes mellitus without complication ([name])   . Foot ulcer ([place])    left foot  . GERD (gastroesophageal reflux disease)   . Heart murmur   . Hypertension   . Kidney insufficiency   . Peripheral vascular disease [facility])     Past Surgical History:  Procedure Laterality Date  . AMPUTATION TOE Left 03/13/2018   Procedure: AMPUTATION TOE-MPJ;  Surgeon: Samara Deist, DPM;  Location: [facility] ORS;  Service: Podiatry;  Laterality: Left;  . ANGIOPLASTY    . CARDIAC CATHETERIZATION    . CARDIAC SURGERY     heart bypass (4)  . CORONARY ARTERY BYPASS GRAFT    . LAPAROSCOPIC APPENDECTOMY N/A 09/08/2018   Procedure: APPENDECTOMY LAPAROSCOPIC;  Surgeon: Jules Husbands, MD;  Location: [facility] ORS;  Service: General;  Laterality: N/A;  . LOWER EXTREMITY ANGIOGRAPHY Left 03/02/2018   Procedure: LOWER EXTREMITY ANGIOGRAPHY;  Surgeon: Algernon Huxley, MD;  Location: [place] CV LAB;  Service: Cardiovascular;  Laterality: Left;  . ROTATOR CUFF REPAIR Left     Current Medications: Current Meds  Medication Sig  . acetaminophen (TYLENOL) 500 MG tablet Take 1,000 mg by mouth every 6 (six) hours as needed for moderate pain or headache.   Marland Kitchen aspirin 81 MG tablet Take 81 mg by mouth daily.  . bumetanide (BUMEX) 1 MG tablet Take 1 mg  by mouth 2 (two) times daily.  . carvedilol (COREG) 6.25 MG tablet Take 6.25 mg by mouth 2 (two) times daily with a meal.   . cloNIDine (CATAPRES) 0.3 MG tablet Take 0.3 mg by mouth 2 (two) times daily.   . clopidogrel (PLAVIX) 75 MG tablet Take 1 tablet (75 mg total) by mouth daily.  . cyclobenzaprine (FLEXERIL) 10 MG tablet SMARTSIG:1-2.5 Tablet(s) By Mouth Every 8 Hours PRN  . gabapentin (NEURONTIN) 600 MG tablet Take 600 mg by mouth 2 (two) times daily.   . isosorbide mononitrate (IMDUR) 120 MG 24 hr tablet Take 120 mg by mouth daily.  Marland Kitchen LEVEMIR  FLEXTOUCH 100 UNIT/ML Pen USE AS DIRECTED ONLY STARTING AT 10 UNITS AT NIGHT. MAXIMUM DOSE 50 UNITS DAILY  . levothyroxine (SYNTHROID) 25 MCG tablet Take 25 mcg by mouth daily.   Marland Kitchen lisinopril (ZESTRIL) 40 MG tablet Take 40 mg by mouth daily.  Marland Kitchen lovastatin (MEVACOR) 40 MG tablet Take 0.5 tablets (20 mg total) by mouth at bedtime.  . nitroGLYCERIN (NITROSTAT) 0.4 MG SL tablet Place 0.4 mg under the tongue every 5 (five) minutes as needed for chest pain.   . vitamin B-12 (CYANOCOBALAMIN) 500 MCG tablet Take by mouth.  . [DISCONTINUED] metolazone (ZAROXOLYN) 2.5 MG tablet Take 1 tablet (2.5 mg total) by mouth daily.     Allergies:   Patient has no known allergies.   Social History   Socioeconomic History  . Marital status: Married    Spouse name: Not on file  . Number of children: Not on file  . Years of education: Not on file  . Highest education level: Not on file  Occupational History  . Not on file  Tobacco Use  . Smoking status: Former Smoker    Quit date: 2003    Years since quitting: 18.4  . Smokeless tobacco: Never Used  Vaping Use  . Vaping Use: Never used  Substance and Sexual Activity  . Alcohol use: Not Currently    Comment: 2003  . Drug use: Not Currently    Types: Marijuana  . Sexual activity: Not on file  Other Topics Concern  . Not on file  Social History Narrative  . Not on file   Social Determinants of Health   Financial Resource Strain:   . Difficulty of Paying Living Expenses:   Food Insecurity:   . Worried About Charity fundraiser in the Last Year:   . Arboriculturist in the Last Year:   Transportation Needs:   . Film/video editor (Medical):   Marland Kitchen Lack of Transportation (Non-Medical):   Physical Activity:   . Days of Exercise per Week:   . Minutes of Exercise per Session:   Stress:   . Feeling of Stress :   Social Connections:   . Frequency of Communication with Friends and Family:   . Frequency of Social Gatherings with Friends and Family:    . Attends Religious Services:   . Active Member of Clubs or Organizations:   . Attends Archivist Meetings:   Marland Kitchen Marital Status:      Family History: The patient's family history includes Diabetes in his mother; Heart attack in his brother; Heart disease in his brother; Hyperlipidemia in his mother; Hypertension in his mother; Lung disease in his father.  ROS:   Please see the history of present illness.    All other systems reviewed and are negative.  EKGs/Labs/Other Studies Reviewed:    The following studies were  reviewed today: Study Highlights   The left ventricular ejection fraction is severely decreased (<30%).  Nuclear stress EF: 23%.  There was no ST segment deviation noted during stress.  Defect 1: There is a medium defect of moderate severity present in the basal inferior, mid anterior, mid inferolateral, apical anterior, apical inferior and apical lateral location.  Findings consistent with prior myocardial infarction.  This is an intermediate risk study.  No evidence of ischemia. RV is enlarged and prominent.   IMPRESSIONS    1. The left ventricle has low normal systolic function, with an ejection  fraction of 50-55%. The cavity size was normal. There is moderate  concentric left ventricular hypertrophy. Left ventricular diastolic  Doppler parameters are consistent with  impaired relaxation.  2. The right ventricle has normal systolic function. The cavity was  mildly enlarged. There is no increase in right ventricular wall thickness.  3. Left atrial size was mildly dilated.  4. Right atrial size was mildly dilated.  5. The tricuspid valve is not well visualized. Tricuspid valve  regurgitation is mild-moderate.  6. The aortic valve is abnormal. Moderate thickening of the aortic valve.  Moderate calcification of the aortic valve. Aortic valve regurgitation is  trivial by color flow Doppler. Mild-moderate stenosis of the aortic valve.  7.  The interatrial septum was not assessed.     Recent Labs: 08/10/2019: ALT 8; BNP 840.1; Hemoglobin 11.6; Platelets 143; TSH 2.950 10/22/2019: BUN 52; Creatinine, Ser 2.42; Potassium 4.2; Sodium 138  Recent Lipid Panel    Component Value Date/Time   CHOL 77 (L) 08/10/2019 1043   TRIG 65 08/10/2019 1043   HDL 25 (L) 08/10/2019 1043   CHOLHDL 3.1 08/10/2019 1043   LDLCALC 37 08/10/2019 1043    Physical Exam:    VS:  BP (!) 148/70   Pulse 64   Ht 6' (1.829 m)   Wt 212 lb 12.8 oz (96.5 kg)   SpO2 97%   BMI 28.86 kg/m     Wt Readings from Last 3 Encounters:  11/15/19 212 lb 12.8 oz (96.5 kg)  10/27/19 218 lb (98.9 kg)  10/15/19 218 lb 12.8 oz (99.2 kg)     GEN: Patient is in no acute distress HEENT: Normal NECK: No JVD; No carotid bruits LYMPHATICS: No lymphadenopathy CARDIAC: Hear sounds regular, 2/6 systolic murmur at the apex. RESPIRATORY:  Clear to auscultation without rales, wheezing or rhonchi  ABDOMEN: Soft, non-tender, non-distended MUSCULOSKELETAL:  No edema; No deformity  SKIN: Warm and dry NEUROLOGIC:  Alert and oriented x 3 PSYCHIATRIC:  Normal affect   Signed, Jenean Lindau, MD  11/15/2019 2:13 PM    [facility]

## 2019-12-30 DIAGNOSIS — I129 Hypertensive chronic kidney disease with stage 1 through stage 4 chronic kidney disease, or unspecified chronic kidney disease: Secondary | ICD-10-CM | POA: Diagnosis not present

## 2019-12-30 DIAGNOSIS — N184 Chronic kidney disease, stage 4 (severe): Secondary | ICD-10-CM | POA: Diagnosis not present

## 2019-12-30 DIAGNOSIS — Z794 Long term (current) use of insulin: Secondary | ICD-10-CM | POA: Diagnosis not present

## 2019-12-30 DIAGNOSIS — I739 Peripheral vascular disease, unspecified: Secondary | ICD-10-CM | POA: Diagnosis not present

## 2019-12-30 DIAGNOSIS — I5022 Chronic systolic (congestive) heart failure: Secondary | ICD-10-CM | POA: Diagnosis not present

## 2019-12-30 DIAGNOSIS — R600 Localized edema: Secondary | ICD-10-CM | POA: Diagnosis not present

## 2019-12-30 DIAGNOSIS — E1122 Type 2 diabetes mellitus with diabetic chronic kidney disease: Secondary | ICD-10-CM | POA: Diagnosis not present

## 2019-12-30 DIAGNOSIS — D638 Anemia in other chronic diseases classified elsewhere: Secondary | ICD-10-CM | POA: Diagnosis not present

## 2020-02-15 ENCOUNTER — Ambulatory Visit: Payer: PPO | Admitting: Cardiology

## 2020-02-16 DIAGNOSIS — E1159 Type 2 diabetes mellitus with other circulatory complications: Secondary | ICD-10-CM | POA: Insufficient documentation

## 2020-02-16 DIAGNOSIS — I739 Peripheral vascular disease, unspecified: Secondary | ICD-10-CM | POA: Insufficient documentation

## 2020-02-16 DIAGNOSIS — N289 Disorder of kidney and ureter, unspecified: Secondary | ICD-10-CM | POA: Insufficient documentation

## 2020-02-16 DIAGNOSIS — I152 Hypertension secondary to endocrine disorders: Secondary | ICD-10-CM | POA: Insufficient documentation

## 2020-02-16 DIAGNOSIS — E119 Type 2 diabetes mellitus without complications: Secondary | ICD-10-CM | POA: Insufficient documentation

## 2020-02-16 DIAGNOSIS — I2510 Atherosclerotic heart disease of native coronary artery without angina pectoris: Secondary | ICD-10-CM | POA: Insufficient documentation

## 2020-02-17 ENCOUNTER — Ambulatory Visit: Payer: PPO | Admitting: Cardiology

## 2020-02-18 ENCOUNTER — Observation Stay: Payer: PPO

## 2020-02-18 ENCOUNTER — Emergency Department: Payer: PPO

## 2020-02-18 ENCOUNTER — Inpatient Hospital Stay
Admission: EM | Admit: 2020-02-18 | Discharge: 2020-02-22 | DRG: 683 | Disposition: A | Discharge status: Home / Self Care | Payer: PPO | Attending: Family Medicine | Admitting: Family Medicine

## 2020-02-18 ENCOUNTER — Other Ambulatory Visit: Payer: Self-pay

## 2020-02-18 DIAGNOSIS — M1611 Unilateral primary osteoarthritis, right hip: Secondary | ICD-10-CM | POA: Diagnosis not present

## 2020-02-18 DIAGNOSIS — D631 Anemia in chronic kidney disease: Secondary | ICD-10-CM | POA: Diagnosis present

## 2020-02-18 DIAGNOSIS — M47816 Spondylosis without myelopathy or radiculopathy, lumbar region: Secondary | ICD-10-CM | POA: Diagnosis present

## 2020-02-18 DIAGNOSIS — Z951 Presence of aortocoronary bypass graft: Secondary | ICD-10-CM

## 2020-02-18 DIAGNOSIS — Z794 Long term (current) use of insulin: Secondary | ICD-10-CM

## 2020-02-18 DIAGNOSIS — E1169 Type 2 diabetes mellitus with other specified complication: Secondary | ICD-10-CM | POA: Diagnosis present

## 2020-02-18 DIAGNOSIS — I130 Hypertensive heart and chronic kidney disease with heart failure and stage 1 through stage 4 chronic kidney disease, or unspecified chronic kidney disease: Secondary | ICD-10-CM | POA: Diagnosis present

## 2020-02-18 DIAGNOSIS — I5042 Chronic combined systolic (congestive) and diastolic (congestive) heart failure: Secondary | ICD-10-CM | POA: Diagnosis present

## 2020-02-18 DIAGNOSIS — E1142 Type 2 diabetes mellitus with diabetic polyneuropathy: Secondary | ICD-10-CM | POA: Diagnosis present

## 2020-02-18 DIAGNOSIS — N179 Acute kidney failure, unspecified: Principal | ICD-10-CM

## 2020-02-18 DIAGNOSIS — J449 Chronic obstructive pulmonary disease, unspecified: Secondary | ICD-10-CM | POA: Diagnosis present

## 2020-02-18 DIAGNOSIS — M1711 Unilateral primary osteoarthritis, right knee: Secondary | ICD-10-CM | POA: Diagnosis not present

## 2020-02-18 DIAGNOSIS — M545 Low back pain: Secondary | ICD-10-CM | POA: Diagnosis not present

## 2020-02-18 DIAGNOSIS — Z833 Family history of diabetes mellitus: Secondary | ICD-10-CM

## 2020-02-18 DIAGNOSIS — M160 Bilateral primary osteoarthritis of hip: Secondary | ICD-10-CM | POA: Diagnosis present

## 2020-02-18 DIAGNOSIS — R531 Weakness: Secondary | ICD-10-CM | POA: Diagnosis not present

## 2020-02-18 DIAGNOSIS — E039 Hypothyroidism, unspecified: Secondary | ICD-10-CM | POA: Diagnosis present

## 2020-02-18 DIAGNOSIS — G8929 Other chronic pain: Secondary | ICD-10-CM | POA: Diagnosis present

## 2020-02-18 DIAGNOSIS — E1149 Type 2 diabetes mellitus with other diabetic neurological complication: Secondary | ICD-10-CM

## 2020-02-18 DIAGNOSIS — Z87891 Personal history of nicotine dependence: Secondary | ICD-10-CM

## 2020-02-18 DIAGNOSIS — M48061 Spinal stenosis, lumbar region without neurogenic claudication: Secondary | ICD-10-CM | POA: Diagnosis present

## 2020-02-18 DIAGNOSIS — E1151 Type 2 diabetes mellitus with diabetic peripheral angiopathy without gangrene: Secondary | ICD-10-CM | POA: Diagnosis present

## 2020-02-18 DIAGNOSIS — E1159 Type 2 diabetes mellitus with other circulatory complications: Secondary | ICD-10-CM | POA: Diagnosis present

## 2020-02-18 DIAGNOSIS — R1031 Right lower quadrant pain: Secondary | ICD-10-CM

## 2020-02-18 DIAGNOSIS — R29898 Other symptoms and signs involving the musculoskeletal system: Secondary | ICD-10-CM

## 2020-02-18 DIAGNOSIS — M1990 Unspecified osteoarthritis, unspecified site: Secondary | ICD-10-CM

## 2020-02-18 DIAGNOSIS — M25451 Effusion, right hip: Secondary | ICD-10-CM | POA: Diagnosis not present

## 2020-02-18 DIAGNOSIS — M5431 Sciatica, right side: Secondary | ICD-10-CM | POA: Diagnosis present

## 2020-02-18 DIAGNOSIS — Z8249 Family history of ischemic heart disease and other diseases of the circulatory system: Secondary | ICD-10-CM

## 2020-02-18 DIAGNOSIS — I129 Hypertensive chronic kidney disease with stage 1 through stage 4 chronic kidney disease, or unspecified chronic kidney disease: Secondary | ICD-10-CM | POA: Diagnosis not present

## 2020-02-18 DIAGNOSIS — Z89422 Acquired absence of other left toe(s): Secondary | ICD-10-CM

## 2020-02-18 DIAGNOSIS — Z83438 Family history of other disorder of lipoprotein metabolism and other lipidemia: Secondary | ICD-10-CM | POA: Diagnosis not present

## 2020-02-18 DIAGNOSIS — E1165 Type 2 diabetes mellitus with hyperglycemia: Secondary | ICD-10-CM | POA: Diagnosis present

## 2020-02-18 DIAGNOSIS — N3289 Other specified disorders of bladder: Secondary | ICD-10-CM | POA: Diagnosis not present

## 2020-02-18 DIAGNOSIS — E785 Hyperlipidemia, unspecified: Secondary | ICD-10-CM | POA: Diagnosis present

## 2020-02-18 DIAGNOSIS — I10 Essential (primary) hypertension: Secondary | ICD-10-CM | POA: Diagnosis not present

## 2020-02-18 DIAGNOSIS — K5730 Diverticulosis of large intestine without perforation or abscess without bleeding: Secondary | ICD-10-CM | POA: Diagnosis not present

## 2020-02-18 DIAGNOSIS — M25551 Pain in right hip: Secondary | ICD-10-CM | POA: Diagnosis not present

## 2020-02-18 DIAGNOSIS — I2510 Atherosclerotic heart disease of native coronary artery without angina pectoris: Secondary | ICD-10-CM | POA: Diagnosis present

## 2020-02-18 DIAGNOSIS — N1330 Unspecified hydronephrosis: Secondary | ICD-10-CM | POA: Diagnosis not present

## 2020-02-18 DIAGNOSIS — N184 Chronic kidney disease, stage 4 (severe): Secondary | ICD-10-CM | POA: Diagnosis present

## 2020-02-18 DIAGNOSIS — I152 Hypertension secondary to endocrine disorders: Secondary | ICD-10-CM | POA: Diagnosis present

## 2020-02-18 DIAGNOSIS — R809 Proteinuria, unspecified: Secondary | ICD-10-CM | POA: Diagnosis not present

## 2020-02-18 DIAGNOSIS — R52 Pain, unspecified: Secondary | ICD-10-CM | POA: Diagnosis not present

## 2020-02-18 DIAGNOSIS — N189 Chronic kidney disease, unspecified: Secondary | ICD-10-CM | POA: Diagnosis not present

## 2020-02-18 DIAGNOSIS — Z20822 Contact with and (suspected) exposure to covid-19: Secondary | ICD-10-CM | POA: Diagnosis present

## 2020-02-18 DIAGNOSIS — E1122 Type 2 diabetes mellitus with diabetic chronic kidney disease: Secondary | ICD-10-CM | POA: Diagnosis present

## 2020-02-18 DIAGNOSIS — R600 Localized edema: Secondary | ICD-10-CM | POA: Diagnosis not present

## 2020-02-18 DIAGNOSIS — R5381 Other malaise: Secondary | ICD-10-CM | POA: Diagnosis not present

## 2020-02-18 DIAGNOSIS — D638 Anemia in other chronic diseases classified elsewhere: Secondary | ICD-10-CM | POA: Diagnosis present

## 2020-02-18 HISTORY — DX: Type 2 diabetes mellitus with other diabetic neurological complication: E11.49

## 2020-02-18 HISTORY — DX: Chronic kidney disease, unspecified: N18.9

## 2020-02-18 HISTORY — DX: Right lower quadrant pain: R10.31

## 2020-02-18 HISTORY — DX: Type 2 diabetes mellitus with other specified complication: E11.69

## 2020-02-18 LAB — COMPREHENSIVE METABOLIC PANEL
ALT: 12 U/L (ref 0–44)
AST: 16 U/L (ref 15–41)
Albumin: 3.9 g/dL (ref 3.5–5.0)
Alkaline Phosphatase: 77 U/L (ref 38–126)
Anion gap: 13 (ref 5–15)
BUN: 91 mg/dL — ABNORMAL HIGH (ref 8–23)
CO2: 26 mmol/L (ref 22–32)
Calcium: 8.7 mg/dL — ABNORMAL LOW (ref 8.9–10.3)
Chloride: 100 mmol/L (ref 98–111)
Creatinine, Ser: 3.55 mg/dL — ABNORMAL HIGH (ref 0.61–1.24)
GFR calc Af Amer: 20 mL/min — ABNORMAL LOW (ref >60)
GFR calc non Af Amer: 17 mL/min — ABNORMAL LOW (ref >60)
Glucose, Bld: 272 mg/dL — ABNORMAL HIGH (ref 70–99)
Potassium: 3.7 mmol/L (ref 3.5–5.1)
Sodium: 139 mmol/L (ref 135–145)
Total Bilirubin: 0.8 mg/dL (ref 0.3–1.2)
Total Protein: 8.3 g/dL — ABNORMAL HIGH (ref 6.5–8.1)

## 2020-02-18 LAB — CBC
HCT: 36.2 % — ABNORMAL LOW (ref 39.0–52.0)
Hemoglobin: 12.3 g/dL — ABNORMAL LOW (ref 13.0–17.0)
MCH: 29.3 pg (ref 26.0–34.0)
MCHC: 34 g/dL (ref 30.0–36.0)
MCV: 86.2 fL (ref 80.0–100.0)
Platelets: 154 10*3/uL (ref 150–400)
RBC: 4.2 MIL/uL — ABNORMAL LOW (ref 4.22–5.81)
RDW: 13.7 % (ref 11.5–15.5)
WBC: 7.3 10*3/uL (ref 4.0–10.5)
nRBC: 0 % (ref 0.0–0.2)

## 2020-02-18 LAB — SARS CORONAVIRUS 2 BY RT PCR (HOSPITAL ORDER, PERFORMED IN ~~LOC~~ HOSPITAL LAB): SARS Coronavirus 2: NEGATIVE

## 2020-02-18 IMAGING — CR DG HIP (WITH OR WITHOUT PELVIS) 2-3V*R*
3 series · 3 of 3 positions shown · non-contrast
Comparison: None.

CLINICAL DATA: Right hip and groin pain for the last 4 days. Pain
with movement. No injury.

EXAM:
DG HIP (WITH OR WITHOUT PELVIS) 2-3V RIGHT

[pelvis ap]
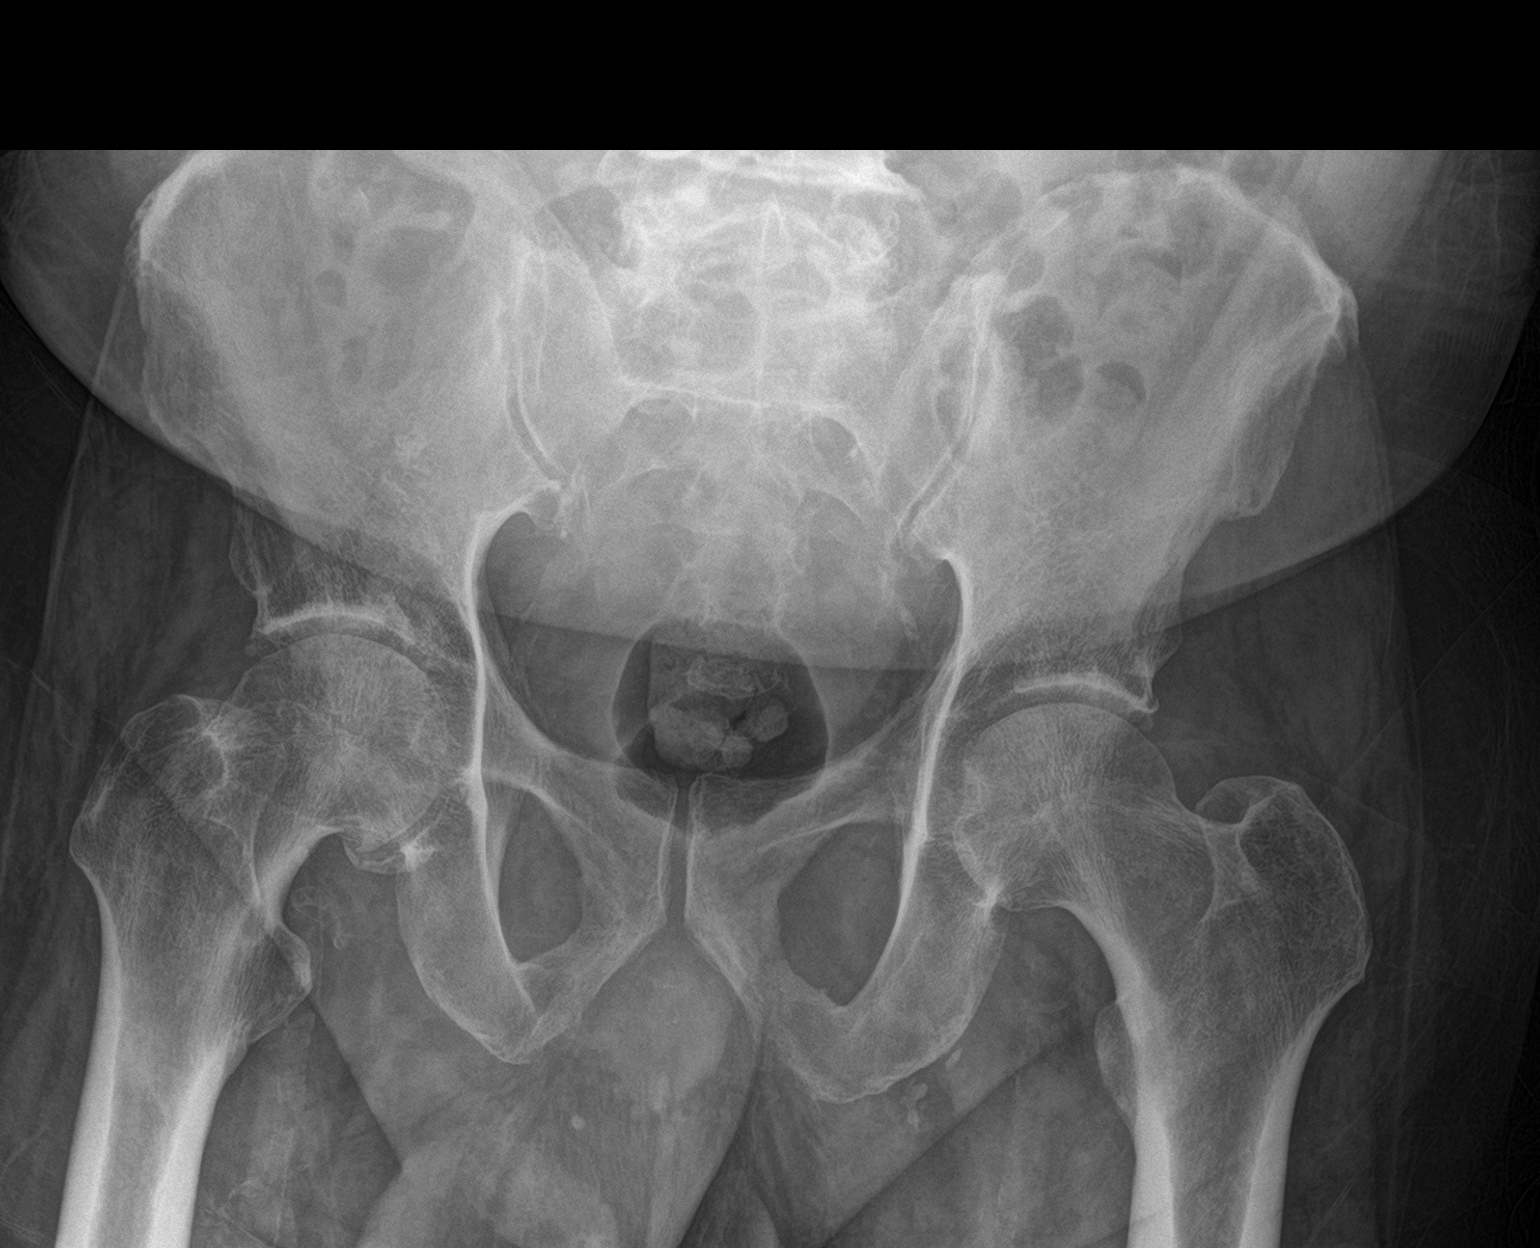

[hip ap]
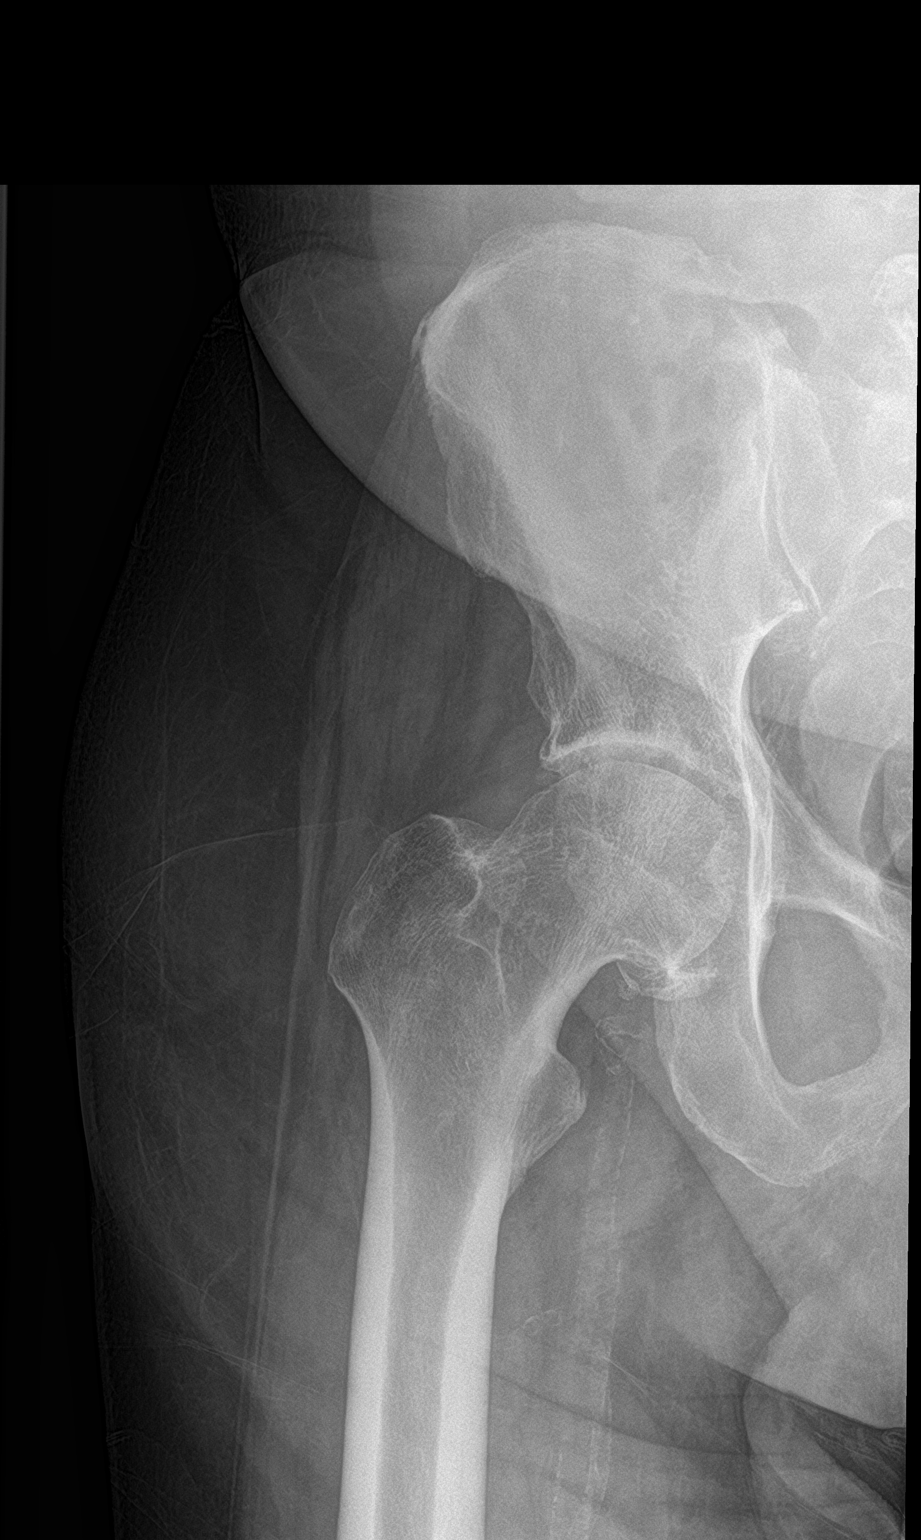

[hip lat]
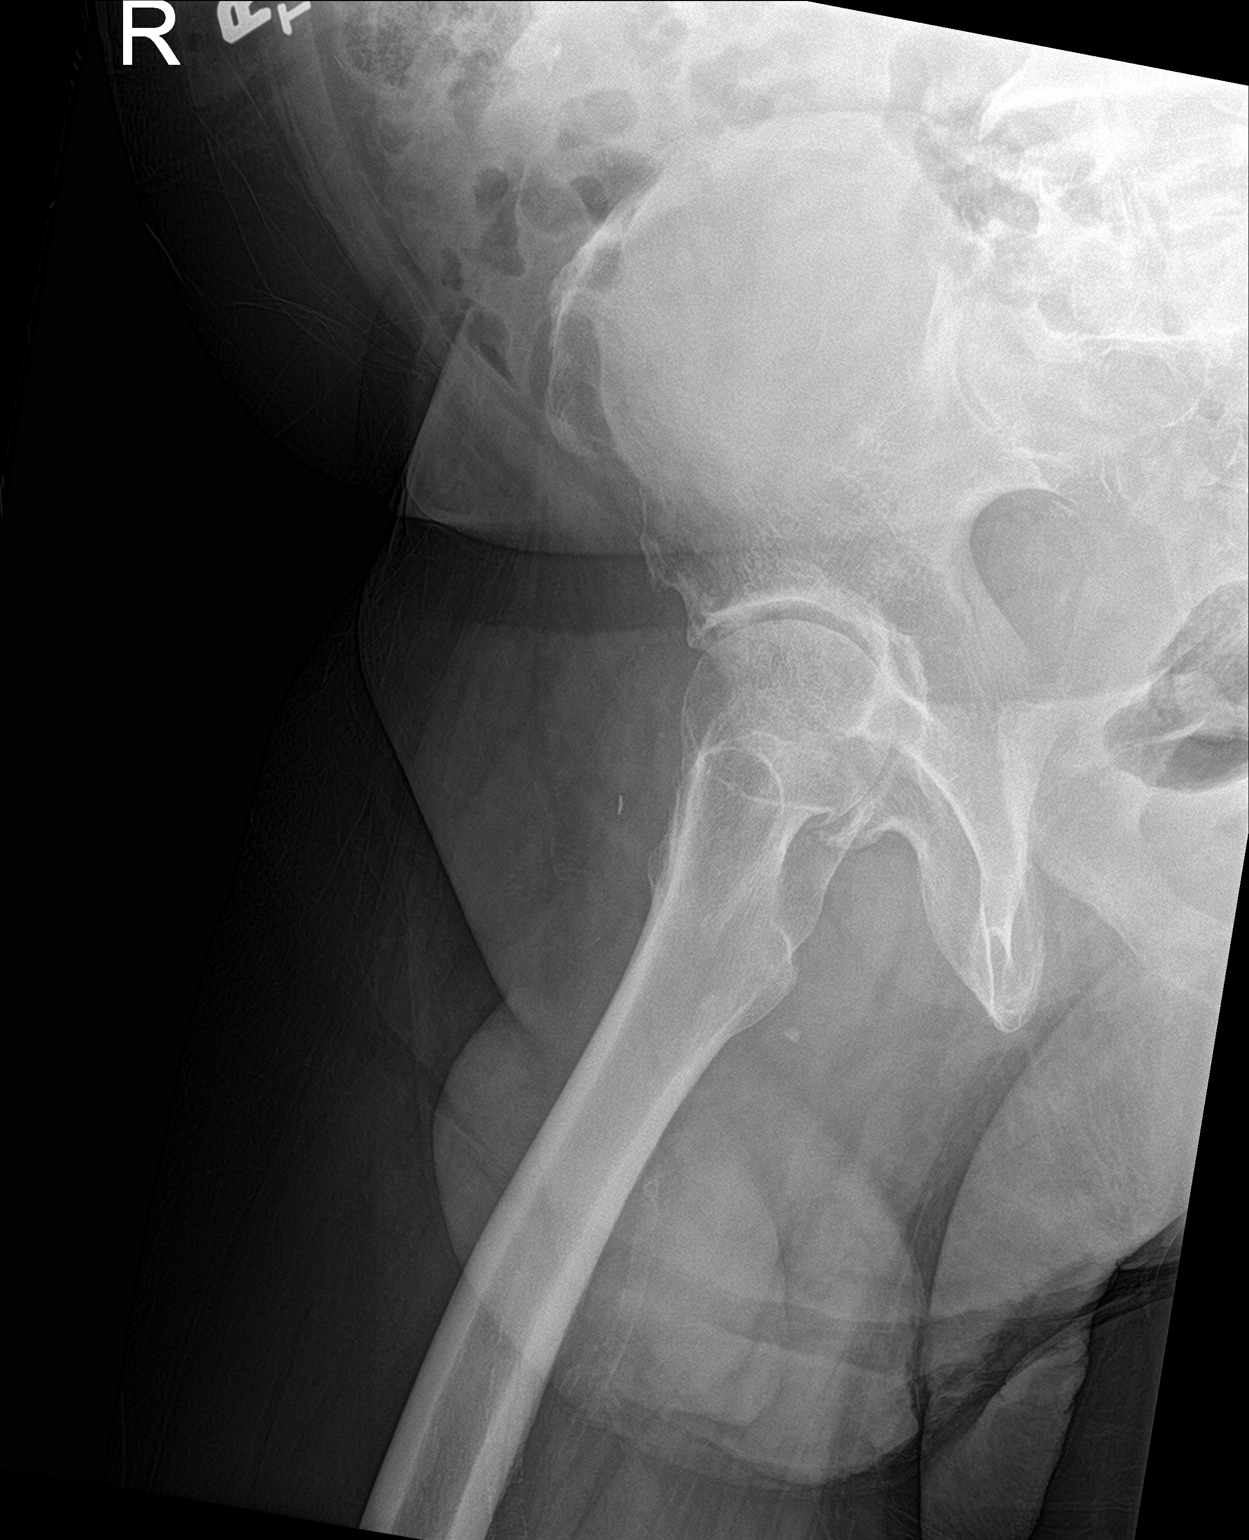

[3 of 3 positions shown; findings below may reference images not displayed]

FINDINGS: No fracture or bone lesion.

Hip joints are normally spaced and aligned.

On the right, there is bony productive change along the inferior
margin of the acetabulum.

SI joints and symphysis pubis are normally spaced and aligned.

Arterial atherosclerotic calcifications are noted along the iliac
and femoral vessels. Soft tissues are otherwise unremarkable.
IMPRESSION: 1. No fracture or acute finding.
2. Mild chronic appearing bony productive changes along the inferior
right acetabulum. No other degenerative/arthropathic changes.

## 2020-02-18 IMAGING — MR MR LUMBAR SPINE W/O CM
5 series · 31 of 48 positions shown · non-contrast
Comparison: None.

CLINICAL DATA: Low back pain radiating to the right lower extremity

EXAM:
MRI LUMBAR SPINE WITHOUT CONTRAST
TECHNIQUE: Multiplanar, multisequence MR imaging of the lumbar spine was
performed. No intravenous contrast was administered.

[Series 5: T2 · sagittal · 4.0mm · 0.81mm/px · 6 of 17 slices shown (1 of 2)]
[im 1/17]
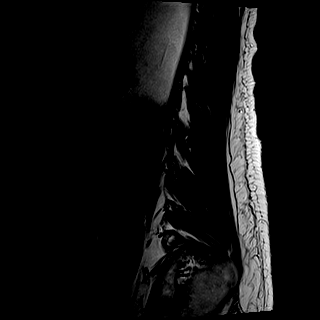
[im 4/17]
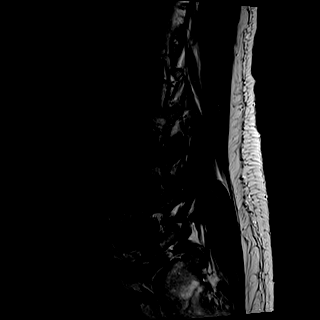
[im 7/17]
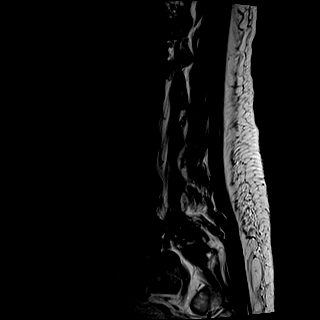
[im 10/17]
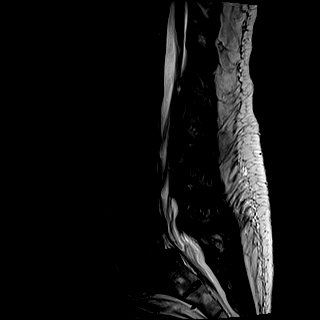
[im 13/17]
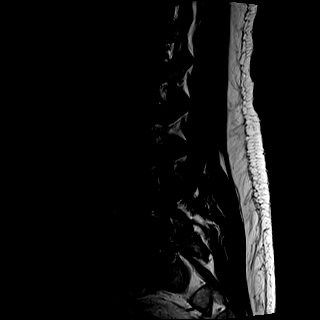
[im 17/17]
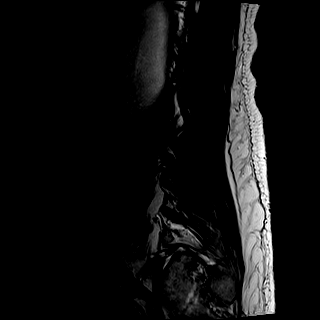

[Series 6: T1 · sagittal · 4.0mm · 0.81mm/px · 7 of 17 slices shown (1 of 2)]
[im 1/17]
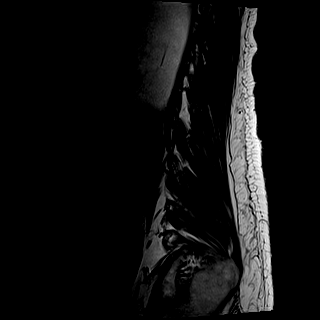
[im 3/17]
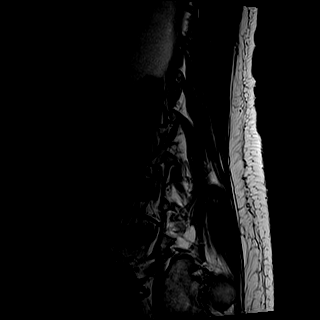
[im 6/17]
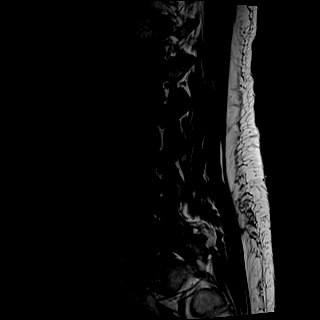
[im 9/17]
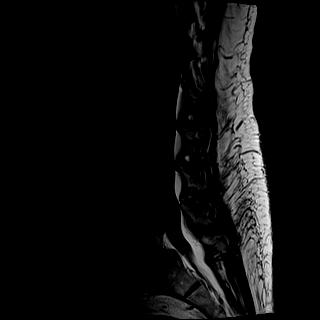
[im 11/17]
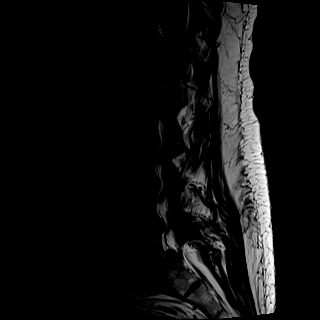
[im 14/17]
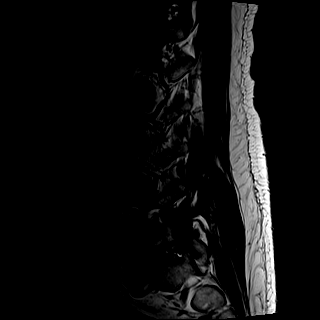
[im 17/17]
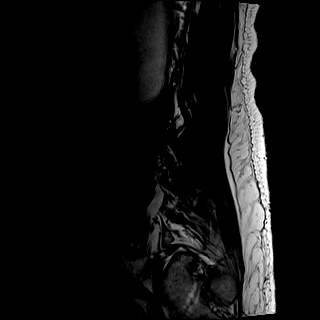

[Series 7: STIR · sagittal · 4.0mm · 0.41mm/px · 2 of 17 slices shown]
[im 1/17]
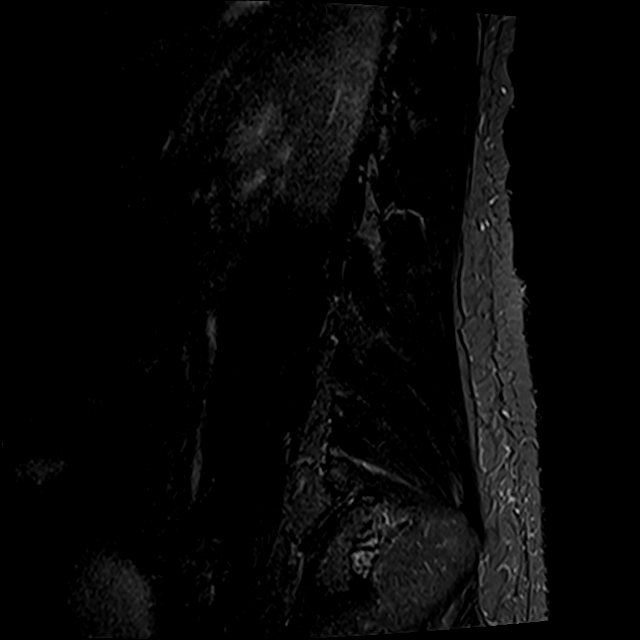
[im 3/17]
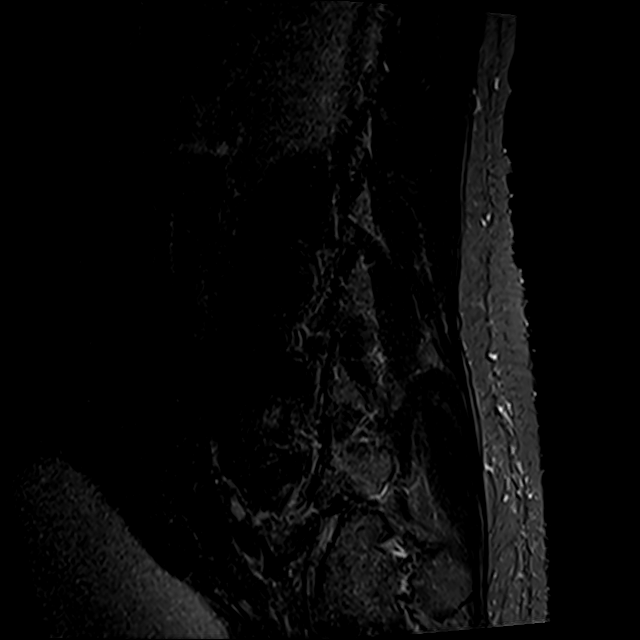

[Series 8: T2 · axial · 4.0mm · 0.78mm/px · z∈[-71,+126]mm · 8 of 34 slices shown (2 of 2)]
[im 1/34]
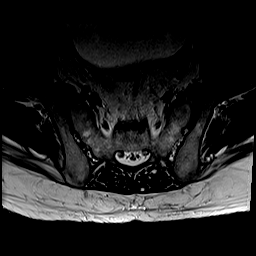
[im 6/34]
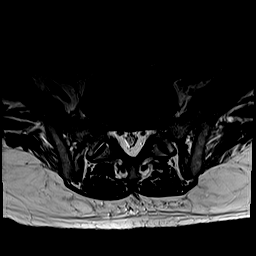
[im 11/34]
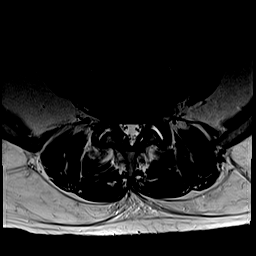
[im 16/34]
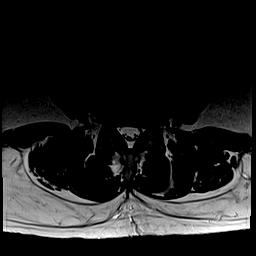
[im 18/34]
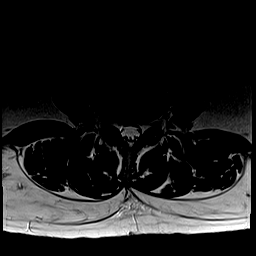
[im 23/34]
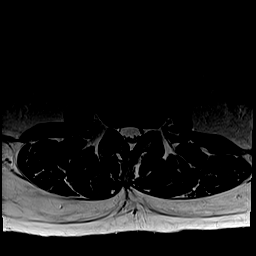
[im 28/34]
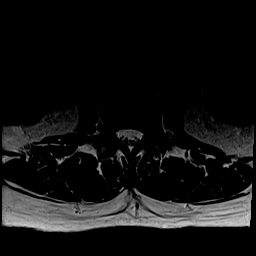
[im 34/34]
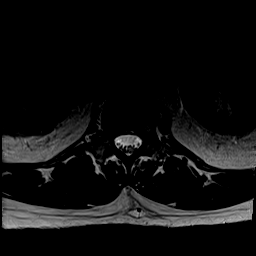

[Series 9: T1 · axial · 4.0mm · 0.39mm/px · z∈[-71,+126]mm · 8 of 34 slices shown (2 of 2)]
[im 1/34]
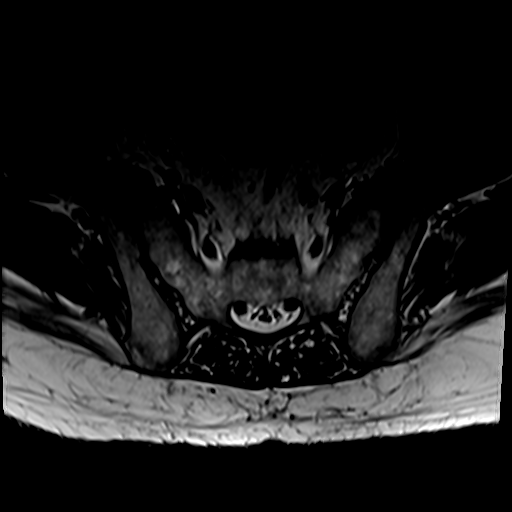
[im 6/34]
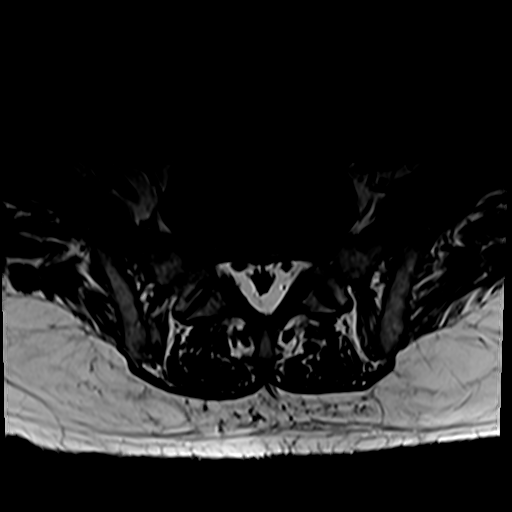
[im 11/34]
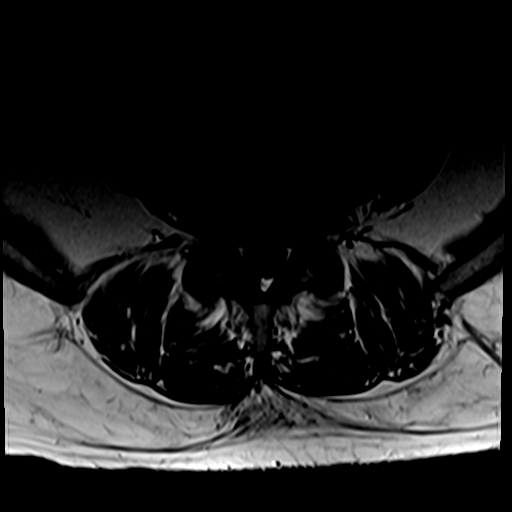
[im 16/34]
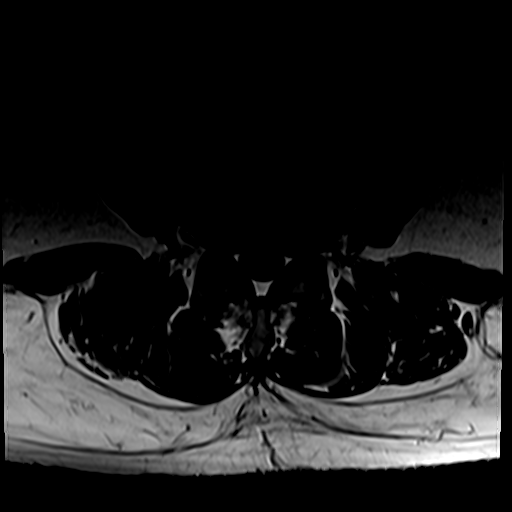
[im 18/34]
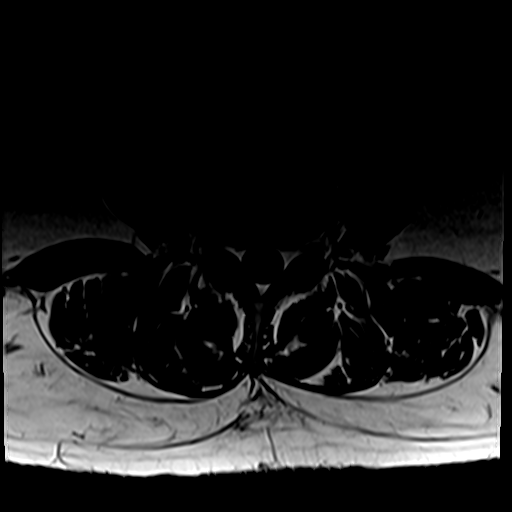
[im 23/34]
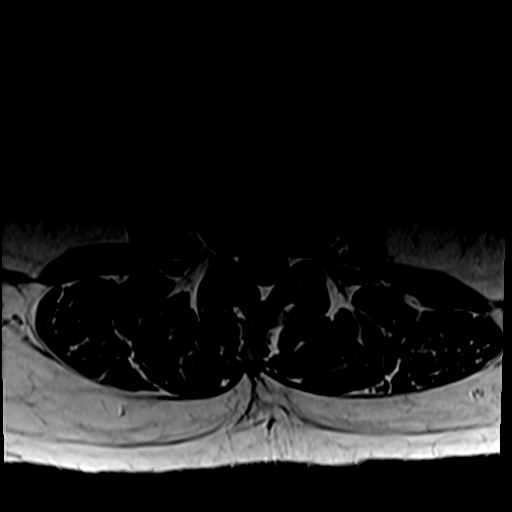
[im 28/34]
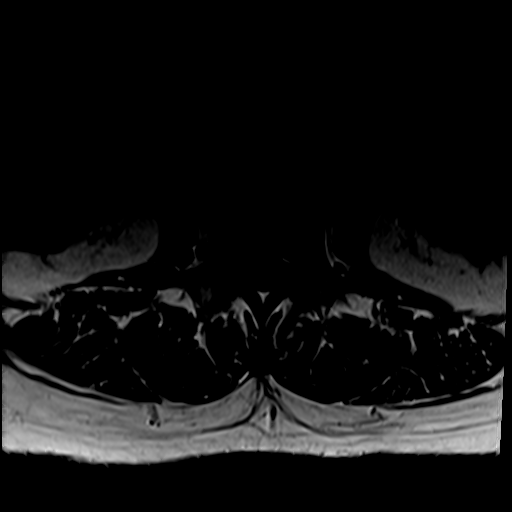
[im 34/34]
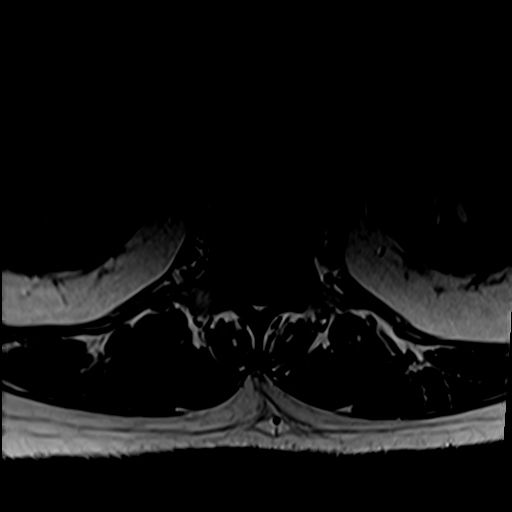

[31 of 48 positions shown; findings below may reference images not displayed]

FINDINGS: Segmentation:  Standard.

Alignment: Grade 1 retrolisthesis at L4-5 and grade 1
anterolisthesis at L5-S1

Vertebrae:  There are bilateral L5 pars interarticularis defects

Conus medullaris and cauda equina: Conus extends to the L1 level.
Conus and cauda equina appear normal.

Paraspinal and other soft tissues: Markedly distended urinary
bladder.

Disc levels:

L1-L2: Normal disc space and facet joints. There is no spinal canal
stenosis. No neural foraminal stenosis.

L2-L3: Normal disc space and facet joints. There is no spinal canal
stenosis. No neural foraminal stenosis.

L3-L4: Normal disc space and facet joints. There is no spinal canal
stenosis. No neural foraminal stenosis.

L4-L5: Mild facet hypertrophy with fluid in the facet joints. Small
disc bulge. There is no spinal canal stenosis. Mild bilateral neural
foraminal stenosis.

L5-S1: Disc space narrowing and partial uncovering. There is no
spinal canal stenosis. Severe bilateral neural foraminal stenosis.

Visualized sacrum: Normal.
IMPRESSION: 1. Bilateral L5 pars interarticularis defects with grade 1
anterolisthesis and severe bilateral neural foraminal stenosis.
2. Mild bilateral L4-5 neural foraminal stenosis.
3. Markedly distended urinary bladder.

## 2020-02-18 IMAGING — CT CT PELVIS W/O CM
2 of 3 series · 14 of 46 positions shown, 16 images · non-contrast
Comparison: CT abdomen pelvis [DATE], hip radiograph [DATE]

CLINICAL DATA: Pelvic pain, stress fracture suspected, severe right
hip pain and sciatica. Groin pain radiating to the leg, unable to
bear weight since [REDACTED]

EXAM:
CT PELVIS WITHOUT CONTRAST
TECHNIQUE: Multidetector CT imaging of the pelvis was performed following the
standard protocol without intravenous contrast.

[Series 3: axial st · axial · 0.85mm/px · z∈[-1205,-987]mm · 11 of 127 slices shown, 13 images]
[im 9/127  soft-tissue]
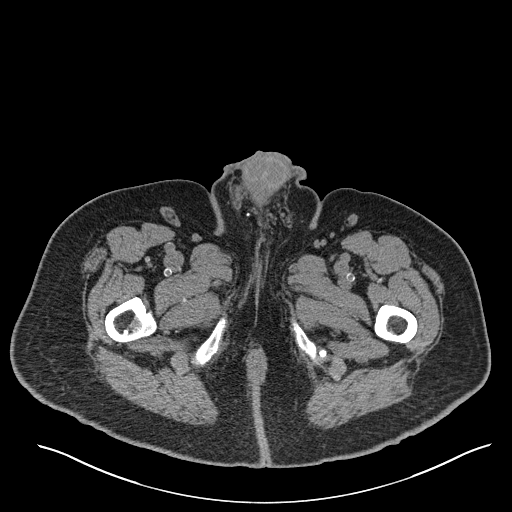
[im 9/127  bone]
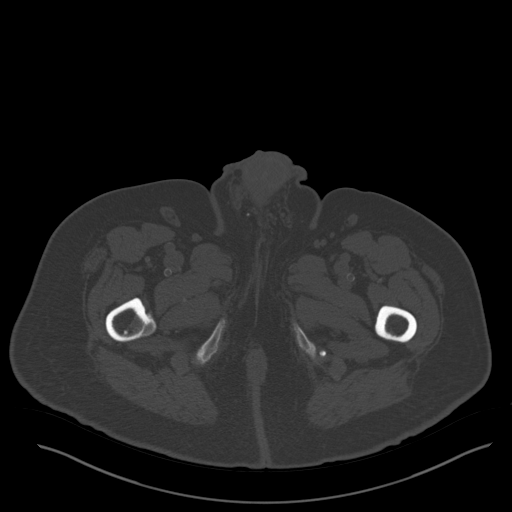
[im 21/127  soft-tissue]
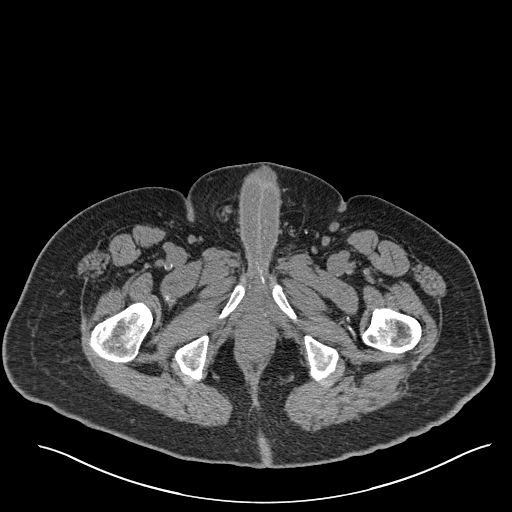
[im 29/127  soft-tissue]
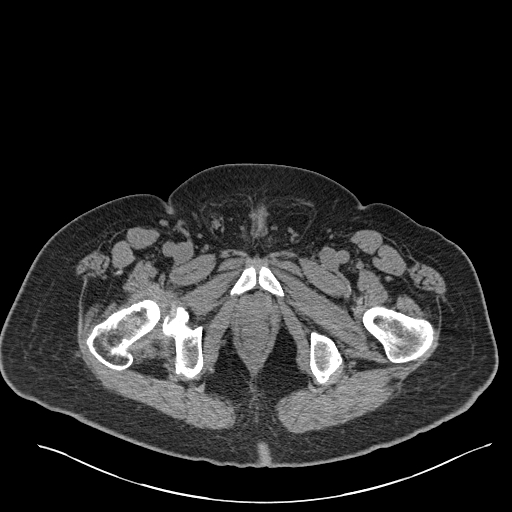
[im 41/127  soft-tissue]
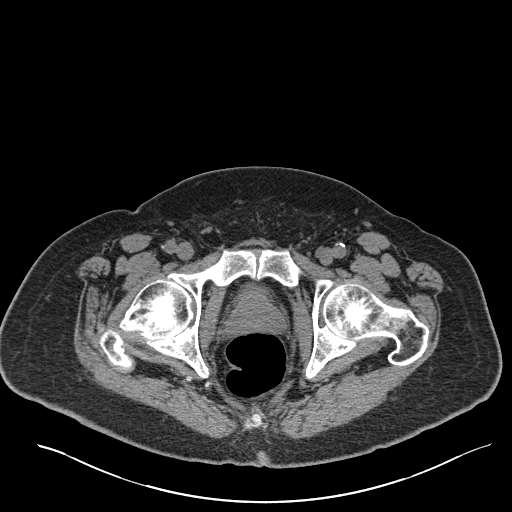
[im 53/127  soft-tissue]
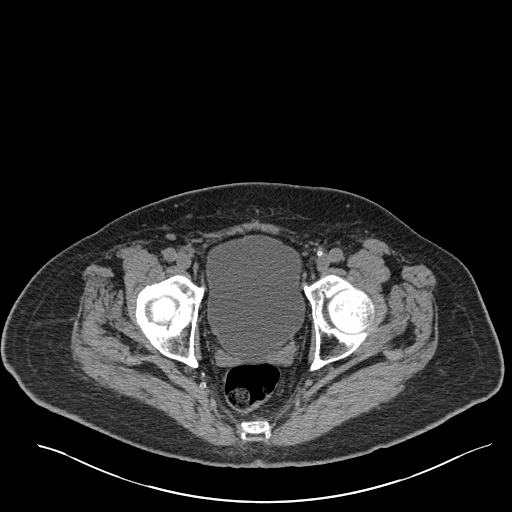
[im 66/127  soft-tissue]
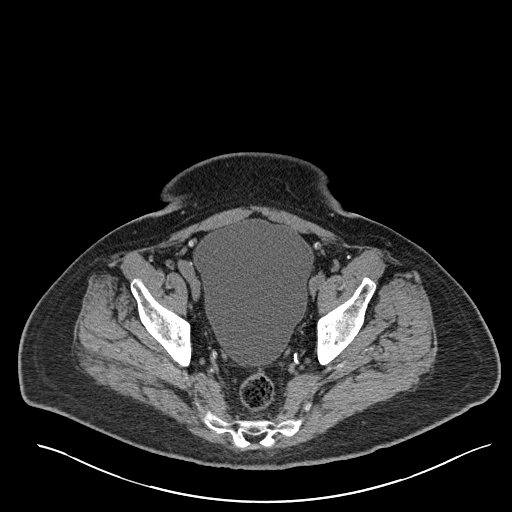
[im 74/127  soft-tissue]
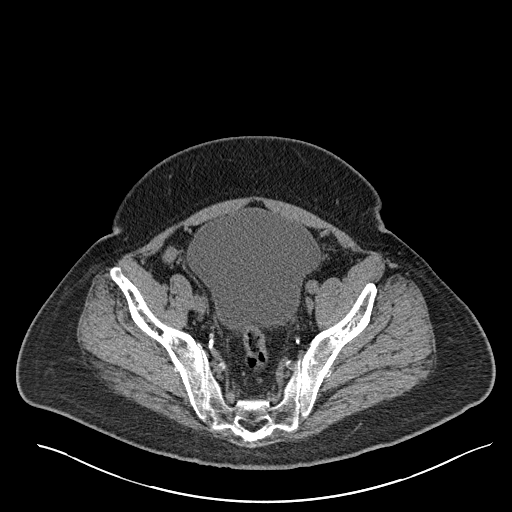
[im 86/127  soft-tissue]
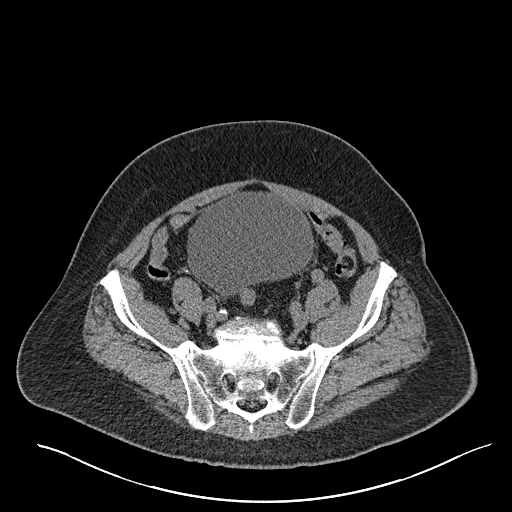
[im 98/127  soft-tissue]
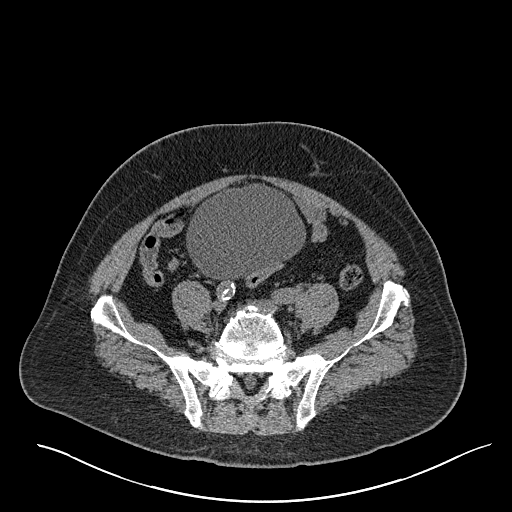
[im 98/127  bone]
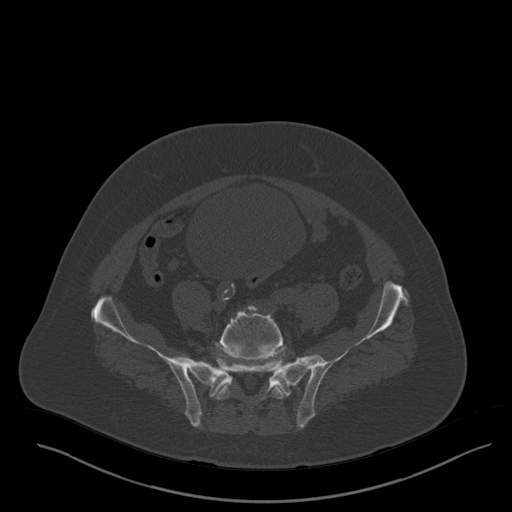
[im 106/127  soft-tissue]
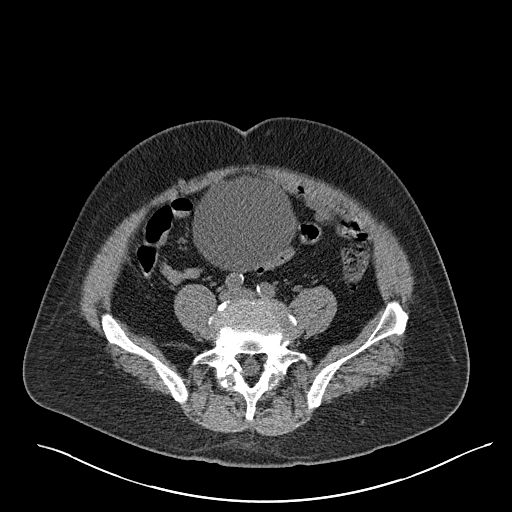
[im 118/127  soft-tissue]
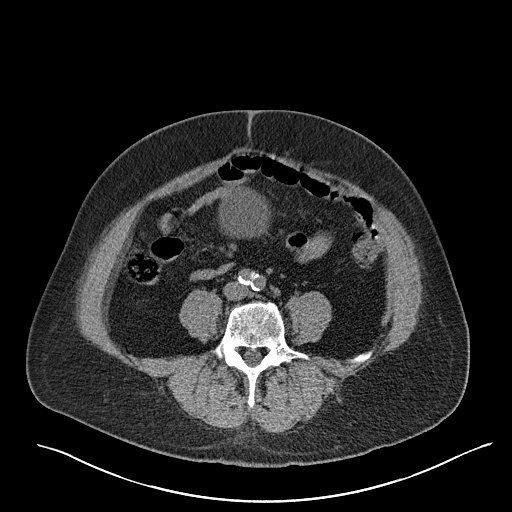

[Series 6: coronal st · coronal · 0.50mm/px · 3 of 151 slices shown]
[im 51/151  soft-tissue]
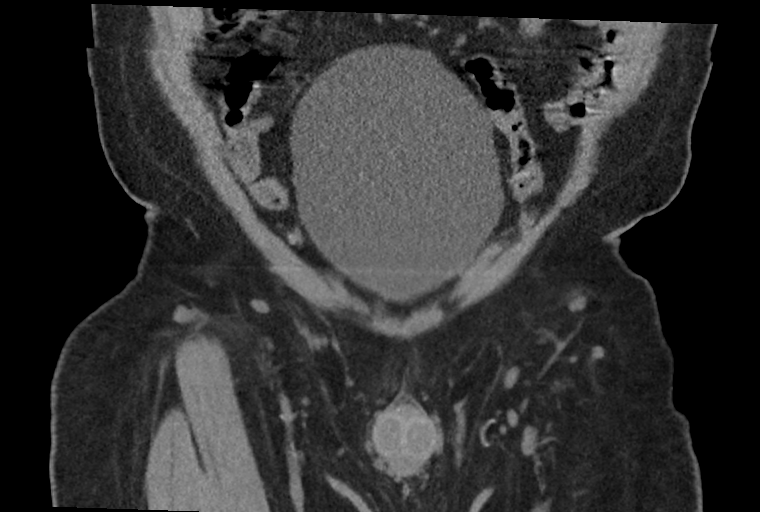
[im 67/151  soft-tissue]
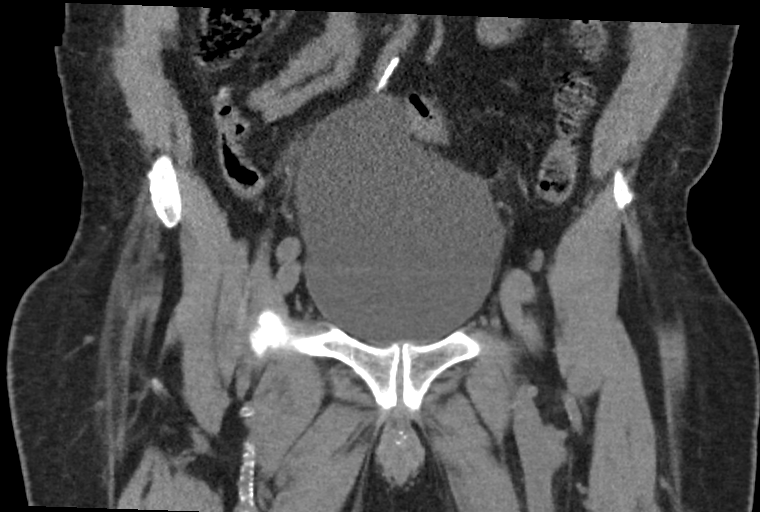
[im 84/151  soft-tissue]
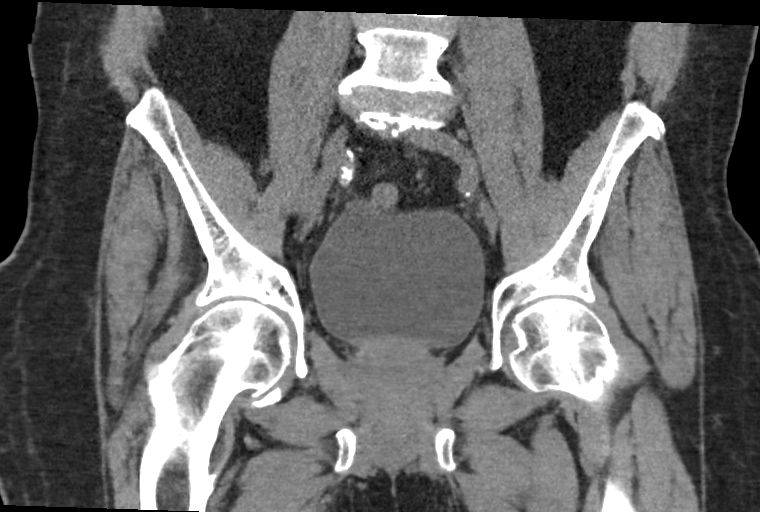

[14 of 46 positions shown; findings below may reference images not displayed]

FINDINGS: Urinary Tract: Bladder distention with indentation of the bladder
base by an enlarged prostate. No bladder wall thickening, visible
calculi or debris. Distal ureters are unremarkable.

Bowel: No abnormal bowel wall thickening, dilatation or evidence of
obstruction. Scattered colonic diverticula without focal
inflammation to suggest diverticulitis. The appendix is surgically
absent.

Vascular/Lymphatic: Aortoiliac atherosclerosis with additional
calcifications in the included proximal outflow vasculature as well.
Luminal evaluation precluded in the absence of contrast media. No
suspicious or enlarged lymph nodes in the included lymphatic chains.

Reproductive: Mild prostatomegaly with slight indentation of the
bladder base by some median lobe hypertrophy. Seminal vesicles are
unremarkable.

Other: Mild body wall edema. No bowel containing hernia. Small fat
containing inguinal hernias. No free pelvic air or fluid.

Musculoskeletal: Redemonstration of 8 mm of grade 1 anterolisthesis
L5 on S1 with associated bilateral L5 pars defects which appear well
corticated, chronic and unchanged from comparison study. Slight
retrolisthesis of L4 on L5 is also unchanged from comparison.
Discogenic and facet degenerative changes are present at the
included L4-S1 levels. Mild canal stenosis and moderate bilateral
foraminal narrowing L4-L5 and severe bilateral foraminal narrowing
L5-S1, right greater than left with compression of the exiting nerve
root (7/89 which is similar to prior but could present is a pain
generator in this patient.

The sacrum is intact. No sacral insufficiency fractures are
identified. Partial ankylosis along superior margins of the
bilateral SI joints with mild underlying sacroiliac arthrosis
unchanged from comparison. Mild angulation at the sacrococcygeal
junction is chronic and unchanged from prior.

There is moderate right and mild left hip osteoarthrosis including
periacetabular spurring, subchondral sclerosis and cyst formation.
Femoral heads however remain normally located in the proximal femora
and cells are intact. These features are progressive from comparison
imaging.
IMPRESSION: 1. No acute fracture or traumatic osseous injuries. No suspicious
osseous lesions.
2. Unchanged 8 mm of grade 1 anterolisthesis L5 on S1 with
associated bilateral L5 pars defects which appear well corticated,
chronic and unchanged from comparison study.
3. Moderate right and mild left hip osteoarthrosis, progressive from
comparison imaging.
4. Discogenic and facet degenerative changes at L4-L5 and L5-S1 with
severe bilateral foraminal narrowing L5-S1, right greater than left,
with resulting compression of the exiting nerve roots.
5. Moderate bladder distention with indentation of the bladder base
by an enlarged prostate. Correlate for symptoms of outlet
obstruction.
6. Diverticulosis without evidence of diverticulitis.
7. Prior appendectomy.
8. Aortic Atherosclerosis ([XT]-[XT]).

## 2020-02-18 MED ORDER — METHOCARBAMOL 500 MG PO TABS
500.0000 mg | ORAL_TABLET | Freq: Once | ORAL | Status: AC
  Administered 2020-02-18: 500 mg via ORAL
  Filled 2020-02-18 (×2): qty 1

## 2020-02-18 MED ORDER — PRAVASTATIN SODIUM 20 MG PO TABS
20.0000 mg | ORAL_TABLET | Freq: Every day | ORAL | Status: DC
  Administered 2020-02-19 – 2020-02-21 (×3): 20 mg via ORAL
  Filled 2020-02-18 (×4): qty 1

## 2020-02-18 MED ORDER — ACETAMINOPHEN 500 MG PO TABS: 1000.0000 mg | ORAL_TABLET | Freq: Four times a day (QID) | ORAL | Status: DC | PRN

## 2020-02-18 MED ORDER — HEPARIN SODIUM (PORCINE) 5000 UNIT/ML IJ SOLN
5000.0000 [IU] | Freq: Three times a day (TID) | INTRAMUSCULAR | Status: DC
  Administered 2020-02-19 – 2020-02-22 (×11): 5000 [IU] via SUBCUTANEOUS
  Filled 2020-02-18 (×11): qty 1

## 2020-02-18 MED ORDER — ONDANSETRON HCL 4 MG/2ML IJ SOLN: 4.0000 mg | Freq: Four times a day (QID) | INTRAMUSCULAR | Status: DC | PRN

## 2020-02-18 MED ORDER — ACETAMINOPHEN 500 MG PO TABS
1000.0000 mg | ORAL_TABLET | Freq: Once | ORAL | Status: AC
  Administered 2020-02-18: 1000 mg via ORAL
  Filled 2020-02-18: qty 2

## 2020-02-18 MED ORDER — LEVOTHYROXINE SODIUM 25 MCG PO TABS
25.0000 ug | ORAL_TABLET | Freq: Every day | ORAL | Status: DC
  Administered 2020-02-19 – 2020-02-22 (×4): 25 ug via ORAL
  Filled 2020-02-18 (×4): qty 1

## 2020-02-18 MED ORDER — CLOPIDOGREL BISULFATE 75 MG PO TABS
75.0000 mg | ORAL_TABLET | Freq: Every day | ORAL | Status: DC
  Administered 2020-02-19 – 2020-02-22 (×4): 75 mg via ORAL
  Filled 2020-02-18 (×4): qty 1

## 2020-02-18 MED ORDER — GABAPENTIN 600 MG PO TABS: 600.0000 mg | ORAL_TABLET | Freq: Two times a day (BID) | ORAL | Status: DC

## 2020-02-18 MED ORDER — LACTATED RINGERS IV BOLUS
500.0000 mL | Freq: Once | INTRAVENOUS | Status: AC
  Administered 2020-02-18: 500 mL via INTRAVENOUS

## 2020-02-18 MED ORDER — GABAPENTIN 300 MG PO CAPS
300.0000 mg | ORAL_CAPSULE | Freq: Two times a day (BID) | ORAL | Status: DC
  Administered 2020-02-19 – 2020-02-22 (×7): 300 mg via ORAL
  Filled 2020-02-18 (×7): qty 1

## 2020-02-18 MED ORDER — SODIUM CHLORIDE 0.9 % IV SOLN: INTRAVENOUS | Status: AC

## 2020-02-18 MED ORDER — ACETAMINOPHEN 650 MG RE SUPP: 650.0000 mg | Freq: Four times a day (QID) | RECTAL | Status: DC | PRN

## 2020-02-18 MED ORDER — CLONIDINE HCL 0.1 MG PO TABS
0.3000 mg | ORAL_TABLET | Freq: Two times a day (BID) | ORAL | Status: DC
  Administered 2020-02-19 – 2020-02-22 (×8): 0.3 mg via ORAL
  Filled 2020-02-18 (×8): qty 3

## 2020-02-18 MED ORDER — ISOSORBIDE MONONITRATE ER 30 MG PO TB24
120.0000 mg | ORAL_TABLET | Freq: Every day | ORAL | Status: DC
  Administered 2020-02-19 – 2020-02-22 (×4): 120 mg via ORAL
  Filled 2020-02-18 (×3): qty 4
  Filled 2020-02-18: qty 2

## 2020-02-18 MED ORDER — CARVEDILOL 3.125 MG PO TABS
6.2500 mg | ORAL_TABLET | Freq: Two times a day (BID) | ORAL | Status: DC
  Administered 2020-02-19 – 2020-02-22 (×6): 6.25 mg via ORAL
  Filled 2020-02-18 (×2): qty 2
  Filled 2020-02-18: qty 1
  Filled 2020-02-18 (×3): qty 2

## 2020-02-18 MED ORDER — SODIUM CHLORIDE 0.9% FLUSH
3.0000 mL | Freq: Two times a day (BID) | INTRAVENOUS | Status: DC
  Administered 2020-02-19 – 2020-02-22 (×6): 3 mL via INTRAVENOUS

## 2020-02-18 MED ORDER — ASPIRIN EC 81 MG PO TBEC
81.0000 mg | DELAYED_RELEASE_TABLET | Freq: Every day | ORAL | Status: DC
  Administered 2020-02-19 – 2020-02-22 (×4): 81 mg via ORAL
  Filled 2020-02-18 (×4): qty 1

## 2020-02-18 MED ORDER — ONDANSETRON HCL 4 MG PO TABS: 4.0000 mg | ORAL_TABLET | Freq: Four times a day (QID) | ORAL | Status: DC | PRN

## 2020-02-18 MED ORDER — INSULIN ASPART 100 UNIT/ML ~~LOC~~ SOLN
0.0000 [IU] | Freq: Three times a day (TID) | SUBCUTANEOUS | Status: DC
  Administered 2020-02-19 (×2): 2 [IU] via SUBCUTANEOUS
  Administered 2020-02-20 (×2): 1 [IU] via SUBCUTANEOUS
  Administered 2020-02-20: 3 [IU] via SUBCUTANEOUS
  Administered 2020-02-21: 5 [IU] via SUBCUTANEOUS
  Administered 2020-02-21: 2 [IU] via SUBCUTANEOUS
  Administered 2020-02-21: 1 [IU] via SUBCUTANEOUS
  Administered 2020-02-22: 3 [IU] via SUBCUTANEOUS
  Administered 2020-02-22: 4 [IU] via SUBCUTANEOUS
  Filled 2020-02-18 (×10): qty 1

## 2020-02-18 MED ORDER — INSULIN DETEMIR 100 UNIT/ML ~~LOC~~ SOLN: 10.0000 [IU] | Freq: Every day | SUBCUTANEOUS | Status: DC

## 2020-02-18 NOTE — ED Notes (Signed)
Patient transported to MRI 

## 2020-02-18 NOTE — ED Triage Notes (Signed)
Pt comes into the ED  Via EMS from home with c/o right groin pain for the past 3 days. CBG 286, 186/90HR70, 97%RA

## 2020-02-18 NOTE — ED Notes (Signed)
Pt complaining of pain in right groin that radiates down the leg. Pt states he is unable to put weight on the leg since Tuesday due to the pain.

## 2020-02-18 NOTE — ED Triage Notes (Signed)
Pt here with right leg pain that started Tuesday and is not able to move it. Pt has pain in his groin when he attempts to move his leg. NAD in triage.

## 2020-02-18 NOTE — H&P (Signed)
History and Physical    Phillip [name]:[id number] DOB: Oct 10, 1955 DOA: 02/18/2020  PCP: Raina Mina., MD  Patient coming from: Home via EMS  I have personally briefly reviewed patient's old medical records in [place]  Chief Complaint: Right groin/hip pain with weakness and difficulty walking  HPI: Phillip [name] is a 64 y.o. male with medical history significant for chronic benign systolic and diastolic CHF (EF 37-90%, G3 DD by TTE 05/12/2019), CKD stage IV, CAD s/p CABG, PVD, IDT2DM, HTN, HLD, anemia of chronic disease, and hypothyroidism who presents to the ED for evaluation of right groin/hip pain.  Patient states that 3 days ago he developed new onset of right groin/hip pain.  Pain has been interfering with his ability to ambulate safely.  He says he has occasional radiation of the pain towards his back.  He has noted occasional burning sensation in his right lower back when he has been standing up cooking.  He has had no issues with his left leg.  He has not had any change in sensation.  He reports good urine output without dysuria.  He reports good bowel movements.  He denies any subjective fevers, chills, diaphoresis, chest pain, palpitations, dyspnea, nausea, vomiting.  He has not any swelling in his right groin or in his legs.  ED Course:  Initial vitals showed BP 173/77, pulse 77, RR 19, temp 98.8 Fahrenheit, SPO2 99% on room air.  Labs show BUN 91, creatinine 3.55 (last creatinine 2.77 on 12/30/2019), GFR 20 (previously 27), sodium 139, potassium 3.7, bicarb 26, LFTs within normal limits, serum glucose 272, WBC 7.3, hemoglobin 12.3, platelets 144,000.  SARS-CoV-2 PCR is obtained and pending.  Right hip x-ray was negative for fracture or acute finding.  Mild chronic appearing bony productive changes along the inferior right acetabulum were seen.  CT pelvis without contrast was negative for acute fracture or traumatic osseous injuries or suspicious osseous  lesions.  Unchanged grade 1 anterolisthesis L5 on S1 with associated bilateral L5 pars defects was seen and unchanged from prior.  Moderate right and mild left hip osteoarthritis were seen, progressive from prior imaging.  Discogenic and facet degenerative changes at L4-L5 and L5-S1 with severe bilateral foraminal narrowing L5-S1 were seen, right greater than left, with compression on existing nerve roots.  Moderate bladder distention, diverticulosis without evidence of diverticulitis, prior appendectomy, and aortic atherosclerosis are also noted.  Patient was given 500 cc LR, 500 mg oral Robaxin, and 1000 mg Tylenol.  The hospitalist service was consulted to admit for further evaluation and management.  Review of Systems: All systems reviewed and are negative except as documented in history of present illness above.   Past Medical History:  Diagnosis Date  . Abdominopelvic abscess ([place]) 09/29/2018  . Abscess of appendix 09/22/2018  . Acute kidney injury ([place]) 10/07/2018  . Anemia, chronic disease 08/18/2019  . Aortic stenosis, moderate 10/15/2019  . Appendicitis 09/08/2018  . Asthma   . Atherosclerosis of native arteries of the extremities with ulceration ([place]) 02/24/2018   Formatting of this note might be different from the original. Last Assessment & Plan:  His ABIs today are 1.07 on the right and 0.99 on the left with biphasic waveforms.  Although these pressures may be somewhat elevated from medial calcification, his flow does appear to be reasonably good.  His left ABI was 0.58 prior to intervention. At this point, we will stretch out his follow-up and see him b  . Benign hypertension with CKD (chronic  kidney disease) stage IV ([place]) 02/24/2018   Last Assessment & Plan:  blood pressure control important in reducing the progression of atherosclerotic disease. On appropriate oral medications.  . Benign prostatic hyperplasia without lower urinary tract symptoms 01/14/2018  . Bilateral lower extremity  edema 11/04/2018  . Bradycardia 03/25/2018  . Bruit of right carotid artery 07/26/2015  . CAD (coronary artery disease) 07/26/2015   Formatting of this note might be different from the original. [name]  Formatting of this note might be different from the original. [name]  . Cardiac murmur 07/26/2015  . CHF (congestive heart failure) ([place])   . Chronic idiopathic constipation 11/02/2018  . Chronic low back pain without sciatica 12/02/2018  . Chronic systolic heart failure ([place]) 07/26/2015   Formatting of this note might be different from the original. [place]kar EF 35%  . CKD (chronic kidney disease) stage 4, GFR 15-29 ml/min ([place]) 10/07/2018  . Coronary artery disease   . Diabetes mellitus without complication ([place])   . Dysphagia 07/26/2015  . Erectile dysfunction 07/14/2017  . Foot ulcer ([place])    left foot  . GERD (gastroesophageal reflux disease)   . Heart murmur   . Hematuria 07/27/2015  . High risk medication use 07/26/2015  . Hyperlipidemia 07/26/2015  . Hypertension   . Hypoglycemia 05/07/2019  . Hypothyroidism (acquired) 11/14/2015  . Insomnia 11/02/2018  . Kidney insufficiency   . Malaise and fatigue 03/25/2018  . Mild aortic stenosis 05/12/2019  . Mitral regurgitation 05/12/2019  . Moderate aortic regurgitation 05/12/2019  . Non-compliance 05/05/2019  . Peripheral vascular disease ([place])   . Polyneuropathy in diabetes ([place]) 05/07/2019  . Primary osteoarthritis involving multiple joints 10/06/2019  . Puncture wound of right hip 04/02/2018  . PVD (peripheral vascular disease) with claudication ([place]) 07/26/2015   Angioplasty 02/2018  . Regional wall motion abnormality of heart 05/07/2019  . Screening for colon cancer 02/18/2018  . Sleep apnea 07/26/2015  . Subacute osteomyelitis of left foot ([place]) 03/25/2018  . Type 2 diabetes mellitus with stage 3 chronic kidney disease, with long-term current use of insulin ([place]) 07/26/2015  . Type 2 diabetes mellitus with stage 4 chronic kidney disease, with  long-term current use of insulin ([place]) 07/26/2015  . Vitamin B12 deficiency 06/16/2019  . Vitamin D deficiency 05/07/2018    Past Surgical History:  Procedure Laterality Date  . AMPUTATION TOE Left 03/13/2018   Procedure: AMPUTATION TOE-MPJ;  Surgeon: Samara Deist, DPM;  Location: [facility] ORS;  Service: Podiatry;  Laterality: Left;  . ANGIOPLASTY    . CARDIAC CATHETERIZATION    . CARDIAC SURGERY     heart bypass (4)  . CORONARY ARTERY BYPASS GRAFT    . LAPAROSCOPIC APPENDECTOMY N/A 09/08/2018   Procedure: APPENDECTOMY LAPAROSCOPIC;  Surgeon: Jules Husbands, MD;  Location: [facility] ORS;  Service: General;  Laterality: N/A;  . LOWER EXTREMITY ANGIOGRAPHY Left 03/02/2018   Procedure: LOWER EXTREMITY ANGIOGRAPHY;  Surgeon: Algernon Huxley, MD;  Location: [facility];  Service: Cardiovascular;  Laterality: Left;  . ROTATOR CUFF REPAIR Left     Social History:  reports that he quit smoking about 18 years ago. He has never used smokeless tobacco. He reports previous alcohol use. He reports previous drug use. Drug: Marijuana.  No Known Allergies  Family History  Problem Relation Age of Onset  . Hyperlipidemia Mother   . Hypertension Mother   . Diabetes Mother   . Heart attack Brother   . Heart disease Brother   . Lung disease Father  Prior to Admission medications   Medication Sig Start Date End Date Taking? Authorizing Provider  acetaminophen (TYLENOL) 500 MG tablet Take 1,000 mg by mouth every 6 (six) hours as needed for moderate pain or headache.     [provider]  aspirin 81 MG tablet Take 81 mg by mouth daily.    [provider]  bumetanide (BUMEX) 1 MG tablet Take 1 mg by mouth 2 (two) times daily. 11/01/19   [provider]  carvedilol (COREG) 6.25 MG tablet Take 6.25 mg by mouth 2 (two) times daily with a meal.  11/14/15   [provider]  cloNIDine (CATAPRES) 0.3 MG tablet Take 0.3 mg by mouth 2 (two) times daily.     [provider]  clopidogrel (PLAVIX) 75 MG tablet Take 1 tablet (75 mg total) by mouth daily. 03/02/18   Algernon Huxley, MD  cyclobenzaprine (FLEXERIL) 10 MG tablet SMARTSIG:1-2.5 Tablet(s) By Mouth Every 8 Hours PRN 12/02/18   [provider]  gabapentin (NEURONTIN) 600 MG tablet Take 600 mg by mouth 2 (two) times daily.     [provider]  isosorbide mononitrate (IMDUR) 120 MG 24 hr tablet Take 120 mg by mouth daily.    [provider]  LEVEMIR FLEXTOUCH 100 UNIT/ML Pen USE AS DIRECTED ONLY STARTING AT 10 UNITS AT NIGHT. MAXIMUM DOSE 50 UNITS DAILY 12/02/18   [provider]  levothyroxine (SYNTHROID) 25 MCG tablet Take 25 mcg by mouth daily.  10/09/18   [provider]  lisinopril (ZESTRIL) 40 MG tablet Take 40 mg by mouth daily. 04/12/19   [provider]  lovastatin (MEVACOR) 40 MG tablet Take 0.5 tablets (20 mg total) by mouth at bedtime. 08/12/19   Revankar, Reita Cliche, MD  nitroGLYCERIN (NITROSTAT) 0.4 MG SL tablet Place 0.4 mg under the tongue every 5 (five) minutes as needed for chest pain.     [provider]  vitamin B-12 (CYANOCOBALAMIN) 500 MCG tablet Take by mouth.    [provider]    Physical Exam: Vitals:   02/18/20 1331 02/18/20 1918 02/18/20 2104 02/18/20 2105  BP: (!) 173/77 (!) 168/72  (!) 175/85  Pulse: 77 81  79  Resp: 19 20  14   Temp: 98.8 F (37.1 C) 98.9 F (37.2 C)    TempSrc: Oral Oral    SpO2: 99% 97% 100% 100%  Weight: 99.3 kg     Height: 6' (1.829 m)      Constitutional: Resting in bed with head elevated, NAD, calm, comfortable Eyes: PERRL, lids and conjunctivae normal ENMT: Mucous membranes are dry. Posterior pharynx clear of any exudate or lesions.Normal dentition.  Neck: normal, supple, no masses. Respiratory: clear to auscultation bilaterally, no wheezing, no crackles. Normal respiratory effort. No accessory muscle use.  Cardiovascular: Regular rate and rhythm, no murmurs / rubs / gallops.  No extremity edema. 2+ pedal pulses. Abdomen: no tenderness, no masses palpated. No hepatosplenomegaly. Bowel sounds positive.  Musculoskeletal: no clubbing / cyanosis. No joint deformity upper and lower extremities.  ROM diminished at right hip due to pain otherwise intact. Skin: no rashes, lesions, ulcers. No induration Neurologic: CN 2-12 grossly intact. Sensation intact, strength 4/5 at the right hip otherwise 5/5 all other extremities.  Good dorsiflexion and plantarflexion of both feet. Psychiatric: Normal judgment and insight. Alert and oriented x 3. Normal mood.   Labs on Admission: I have personally reviewed following labs and imaging studies  CBC: Recent Labs  Lab 02/18/20 1346  WBC  7.3  HGB 12.3*  HCT 36.2*  MCV 86.2  PLT 831   Basic Metabolic Panel: Recent Labs  Lab 02/18/20 1346  NA 139  K 3.7  CL 100  CO2 26  GLUCOSE 272*  BUN 91*  CREATININE 3.55*  CALCIUM 8.7*   GFR: Estimated Creatinine Clearance: 25.7 mL/min (A) (by C-G formula based on SCr of 3.55 mg/dL (H)). Liver Function Tests: Recent Labs  Lab 02/18/20 1346  AST 16  ALT 12  ALKPHOS 77  BILITOT 0.8  PROT 8.3*  ALBUMIN 3.9   No results for input(s): LIPASE, AMYLASE in the last 168 hours. No results for input(s): AMMONIA in the last 168 hours. Coagulation Profile: No results for input(s): INR, PROTIME in the last 168 hours. Cardiac Enzymes: No results for input(s): CKTOTAL, CKMB, CKMBINDEX, TROPONINI in the last 168 hours. BNP (last 3 results) No results for input(s): PROBNP in the last 8760 hours. HbA1C: No results for input(s): HGBA1C in the last 72 hours. CBG: No results for input(s): GLUCAP in the last 168 hours. Lipid Profile: No results for input(s): CHOL, HDL, LDLCALC, TRIG, CHOLHDL, LDLDIRECT in the last 72 hours. Thyroid Function Tests: No results for input(s): TSH, T4TOTAL, FREET4, T3FREE, THYROIDAB in the last 72 hours. Anemia Panel: No results for input(s): VITAMINB12,  FOLATE, FERRITIN, TIBC, IRON, RETICCTPCT in the last 72 hours. Urine analysis:    Component Value Date/Time   COLORURINE YELLOW (A) 10/20/2018 1658   APPEARANCEUR CLEAR (A) 10/20/2018 1658   LABSPEC 1.009 10/20/2018 1658   PHURINE 6.0 10/20/2018 1658   GLUCOSEU NEGATIVE 10/20/2018 1658   HGBUR SMALL (A) 10/20/2018 1658   BILIRUBINUR NEGATIVE 10/20/2018 1658   KETONESUR NEGATIVE 10/20/2018 1658   PROTEINUR 30 (A) 10/20/2018 1658   NITRITE NEGATIVE 10/20/2018 1658   LEUKOCYTESUR MODERATE (A) 10/20/2018 1658    Radiological Exams on Admission: CT PELVIS WO CONTRAST  Result Date: 02/18/2020 CLINICAL DATA:  Pelvic pain, stress fracture suspected, severe right hip pain and sciatica. Groin pain radiating to the leg, unable to bear weight since Tuesday EXAM: CT PELVIS WITHOUT CONTRAST TECHNIQUE: Multidetector CT imaging of the pelvis was performed following the standard protocol without intravenous contrast. COMPARISON:  CT abdomen pelvis 10/05/2018, hip radiograph 02/18/2020 FINDINGS: Urinary Tract: Bladder distention with indentation of the bladder base by an enlarged prostate. No bladder wall thickening, visible calculi or debris. Distal ureters are unremarkable. Bowel: No abnormal bowel wall thickening, dilatation or evidence of obstruction. Scattered colonic diverticula without focal inflammation to suggest diverticulitis. The appendix is surgically absent. Vascular/Lymphatic: Aortoiliac atherosclerosis with additional calcifications in the included proximal outflow vasculature as well. Luminal evaluation precluded in the absence of contrast media. No suspicious or enlarged lymph nodes in the included lymphatic chains. Reproductive: Mild prostatomegaly with slight indentation of the bladder base by some median lobe hypertrophy. Seminal vesicles are unremarkable. Other: Mild body wall edema. No bowel containing hernia. Small fat containing inguinal hernias. No free pelvic air or fluid.  Musculoskeletal: Redemonstration of 8 mm of grade 1 anterolisthesis L5 on S1 with associated bilateral L5 pars defects which appear well corticated, chronic and unchanged from comparison study. Slight retrolisthesis of L4 on L5 is also unchanged from comparison. Discogenic and facet degenerative changes are present at the included L4-S1 levels. Mild canal stenosis and moderate bilateral foraminal narrowing L4-L5 and severe bilateral foraminal narrowing L5-S1, right greater than left with compression of the exiting nerve root (7/89 which is similar to prior but could present is a pain generator in  this patient. The sacrum is intact. No sacral insufficiency fractures are identified. Partial ankylosis along superior margins of the bilateral SI joints with mild underlying sacroiliac arthrosis unchanged from comparison. Mild angulation at the sacrococcygeal junction is chronic and unchanged from prior. There is moderate right and mild left hip osteoarthrosis including periacetabular spurring, subchondral sclerosis and cyst formation. Femoral heads however remain normally located in the proximal femora and cells are intact. These features are progressive from comparison imaging. IMPRESSION: 1. No acute fracture or traumatic osseous injuries. No suspicious osseous lesions. 2. Unchanged 8 mm of grade 1 anterolisthesis L5 on S1 with associated bilateral L5 pars defects which appear well corticated, chronic and unchanged from comparison study. 3. Moderate right and mild left hip osteoarthrosis, progressive from comparison imaging. 4. Discogenic and facet degenerative changes at L4-L5 and L5-S1 with severe bilateral foraminal narrowing L5-S1, right greater than left, with resulting compression of the exiting nerve roots. 5. Moderate bladder distention with indentation of the bladder base by an enlarged prostate. Correlate for symptoms of outlet obstruction. 6. Diverticulosis without evidence of diverticulitis. 7. Prior  appendectomy. 8. Aortic Atherosclerosis (ICD10-I70.0). Electronically Signed   By: Lovena Le M.D.   On: 02/18/2020 22:15   DG Hip Unilat W or Wo Pelvis 2-3 Views Right  Result Date: 02/18/2020 CLINICAL DATA:  Right hip and groin pain for the last 4 days. Pain with movement. No injury. EXAM: DG HIP (WITH OR WITHOUT PELVIS) 2-3V RIGHT COMPARISON:  None. FINDINGS: No fracture or bone lesion. Hip joints are normally spaced and aligned. On the right, there is bony productive change along the inferior margin of the acetabulum. SI joints and symphysis pubis are normally spaced and aligned. Arterial atherosclerotic calcifications are noted along the iliac and femoral vessels. Soft tissues are otherwise unremarkable. IMPRESSION: 1. No fracture or acute finding. 2. Mild chronic appearing bony productive changes along the inferior right acetabulum. No other degenerative/arthropathic changes. Electronically Signed   By: Lajean Manes M.D.   On: 02/18/2020 14:21    EKG: Ordered and pending.  Assessment/Plan Principal Problem:   Acute kidney injury superimposed on CKD ([place]) Active Problems:   CAD (coronary artery disease)   Hypothyroidism   Chronic combined systolic and diastolic CHF (congestive heart failure) ([facility])   Anemia of chronic disease   Hypertension associated with diabetes ([place])   Type 2 diabetes mellitus with stage 4 chronic kidney disease, with long-term current use of insulin ([facility])   Hyperlipidemia associated with type 2 diabetes mellitus ([place])   Right groin pain  Phillip [name] is a 64 y.o. male with medical history significant for chronic benign systolic and diastolic CHF (EF 82-50%, G3 DD by TTE 05/12/2019), CKD stage IV, CAD s/p CABG, PVD, IDT2DM, HTN, HLD, anemia of chronic disease, and hypothyroidism who is admitted with AKI on CKD stage IV.  AKI on CKD stage IV: Suspect prerenal as patient appears volume depleted on admission in setting of home diuretics. -Hold home Bumex,  lisinopril -Check urinalysis, urine sodium/creatinine/urea -Start gentle IV fluid hydration with NS@100  mL/hour overnight -Monitor strict I/O's and daily weights  Right groin/hip pain with associated weakness and difficulty ambulating: CT pelvis without evidence of fracture, traumatic osseous injuries, or suspicious osseous lesions.  Severe bilateral foraminal narrowing L5-S1 seen on CT imaging. -Follow MRI lumbar spine -Continue Tylenol as needed for pain -Continue home gabapentin at reduced dose due to renal dysfunction -Request PT/OT eval  Chronic combined systolic and diastolic CHF: EF 53-97%, G3 DD by TTE  05/12/2019.  Appears volume depleted on admission as above. -Holding home Bumex and lisinopril -Continue carvedilol and Imdur -Continue gentle IV fluid hydration overnight -Strict I/O's and daily weights  Insulin-dependent type 2 diabetes: Hyperglycemic on admission.  Continue home Levemir 10 units nightly and add very sensitive SSI while in hospital.  Check A1c.  CAD s/p CABG PVD: Chronic and stable without chest pain.  Continue aspirin, Plavix, Coreg Imdur, statin.  Hypertension: Continue home Coreg, clonidine, Imdur.  Holding lisinopril in setting of AKI.  Hyperlipidemia: Continue statin.  Anemia of chronic renal disease: Chronic and stable.  Continue monitor.  Hypothyroidism: Continue Synthroid.  DVT prophylaxis: Subcutaneous heparin Code Status: Full code, confirmed with patient Family Communication: Discussed with patient, he has discussed with family Disposition Plan: From home, dispo pending PT/OT eval Consults called: None Admission status:  Status is: Observation  The patient remains OBS appropriate and will d/c before 2 midnights.  Dispo: The patient is from: Home              Anticipated d/c is to: Home vs SNF pending PT/OT eval              Anticipated d/c date is: 1 day              Patient currently is not medically stable to d/c.   Zada Finders MD [facility]  If 7PM-7AM, please contact night-coverage [email or web address]  02/18/2020, 11:00 PM

## 2020-02-18 NOTE — ED Provider Notes (Signed)
[facility] Emergency Department Provider Note ____________________________________________   First MD Initiated Contact with Patient 02/18/20 2050     (approximate)  I have reviewed the triage vital signs and the nursing notes.  HISTORY  Chief Complaint No chief complaint on file.   HPI Phillip [name] is a 64 y.o. malewho presents to the ED for evaluation of leg pain.   Chart review indicates hx CKD, CHF, DM, HLD.  Preserved ejection fraction. Patient lives at home with his wife and is quite functional, handling his own ADLs and ambulatory without assistance device at baseline.  Patient reports about 3 days of worsening right hip/groin pain radiating down the back of his right leg to the sole of his right foot.  Patient reports this pain is constant over the past 3 days, burning and shooting in nature, up to 10/10 severity.  He has not taken any medications at home to help alleviate this pain.  He denies any trauma to the area, falls or discrete incidents.  He reports associated weakness to his right leg indicating that "it does not work normally."  While he is certainly reporting severe pain, he indicates that there is an additional elements of poor mobility and abnormal functioning separate from the pain in terms of his leg "does not move."  With further questioning, he does eventually indicate that he has had a degree of chronic lower back pain, worse on the right, that shoots down his leg in a similar manner over the past few weeks/months.  He relates that he often feels the shooting sciatica pain while standing in the kitchen cooking, but for the past 3 days the pain in his hip has been severe and causing him to have difficulty getting out of bed, therefore causing decreased p.o. intake.  He denies any fevers, recent illnesses or antibiotic use.  Denies chest pain, shortness of breath, cough, headache, head trauma, back trauma.  He denies any saddle  anesthesias.  He denies urinary or stool incontinence/retention.    Past Medical History:  Diagnosis Date  . Abdominopelvic abscess ([place]) 09/29/2018  . Abscess of appendix 09/22/2018  . Acute kidney injury ([place]) 10/07/2018  . Anemia, chronic disease 08/18/2019  . Aortic stenosis, moderate 10/15/2019  . Appendicitis 09/08/2018  . Asthma   . Atherosclerosis of native arteries of the extremities with ulceration ([place]) 02/24/2018   Formatting of this note might be different from the original. Last Assessment & Plan:  His ABIs today are 1.07 on the right and 0.99 on the left with biphasic waveforms.  Although these pressures may be somewhat elevated from medial calcification, his flow does appear to be reasonably good.  His left ABI was 0.58 prior to intervention. At this point, we will stretch out his follow-up and see him b  . Benign hypertension with CKD (chronic kidney disease) stage IV ([place]) 02/24/2018   Last Assessment & Plan:  blood pressure control important in reducing the progression of atherosclerotic disease. On appropriate oral medications.  . Benign prostatic hyperplasia without lower urinary tract symptoms 01/14/2018  . Bilateral lower extremity edema 11/04/2018  . Bradycardia 03/25/2018  . Bruit of right carotid artery 07/26/2015  . CAD (coronary artery disease) 07/26/2015   Formatting of this note might be different from the original. [name]  Formatting of this note might be different from the original. [name]  . Cardiac murmur 07/26/2015  . CHF (congestive heart failure) ([place])   . Chronic idiopathic constipation 11/02/2018  . Chronic  low back pain without sciatica 12/02/2018  . Chronic systolic heart failure ([place]) 07/26/2015   Formatting of this note might be different from the original. revenkar EF 35%  . CKD (chronic kidney disease) stage 4, GFR 15-29 ml/min ([place]) 10/07/2018  . Coronary artery disease   . Diabetes mellitus without complication ([place])   . Dysphagia 07/26/2015  . Erectile  dysfunction 07/14/2017  . Foot ulcer ([place])    left foot  . GERD (gastroesophageal reflux disease)   . Heart murmur   . Hematuria 07/27/2015  . High risk medication use 07/26/2015  . Hyperlipidemia 07/26/2015  . Hypertension   . Hypoglycemia 05/07/2019  . Hypothyroidism (acquired) 11/14/2015  . Insomnia 11/02/2018  . Kidney insufficiency   . Malaise and fatigue 03/25/2018  . Mild aortic stenosis 05/12/2019  . Mitral regurgitation 05/12/2019  . Moderate aortic regurgitation 05/12/2019  . Non-compliance 05/05/2019  . Peripheral vascular disease ([place])   . Polyneuropathy in diabetes ([place]) 05/07/2019  . Primary osteoarthritis involving multiple joints 10/06/2019  . Puncture wound of right hip 04/02/2018  . PVD (peripheral vascular disease) with claudication ([place]) 07/26/2015   Angioplasty 02/2018  . Regional wall motion abnormality of heart 05/07/2019  . Screening for colon cancer 02/18/2018  . Sleep apnea 07/26/2015  . Subacute osteomyelitis of left foot ([place]) 03/25/2018  . Type 2 diabetes mellitus with stage 3 chronic kidney disease, with long-term current use of insulin ([place]) 07/26/2015  . Type 2 diabetes mellitus with stage 4 chronic kidney disease, with long-term current use of insulin ([place]) 07/26/2015  . Vitamin B12 deficiency 06/16/2019  . Vitamin D deficiency 05/07/2018    Patient Active Problem List   Diagnosis Date Noted  . Acute kidney injury superimposed on CKD ([place]) 02/18/2020  . Hyperlipidemia associated with type 2 diabetes mellitus ([place]) 02/18/2020  . Right groin pain 02/18/2020  . Asthma   . Coronary artery disease   . Diabetes mellitus without complication ([place])   . GERD (gastroesophageal reflux disease)   . Heart murmur   . Hypertension associated with diabetes ([place])   . Kidney insufficiency   . Peripheral vascular disease ([place])   . Aortic stenosis, moderate 10/15/2019  . Primary osteoarthritis involving multiple joints 10/06/2019  . Anemia of chronic disease 08/18/2019  .  Vitamin B12 deficiency 06/16/2019  . Mild aortic stenosis 05/12/2019  . Moderate aortic regurgitation 05/12/2019  . Mitral regurgitation 05/12/2019  . Hypoglycemia 05/07/2019  . Polyneuropathy in diabetes ([place]) 05/07/2019  . Regional wall motion abnormality of heart 05/07/2019  . Non-compliance 05/05/2019  . Chronic low back pain without sciatica 12/02/2018  . Bilateral lower extremity edema 11/04/2018  . Chronic idiopathic constipation 11/02/2018  . Insomnia 11/02/2018  . Acute kidney injury ([place]) 10/07/2018  . CKD (chronic kidney disease) stage 4, GFR 15-29 ml/min (HCC) 10/07/2018  . Abdominopelvic abscess ([place]) 09/29/2018  . Abscess of appendix 09/22/2018  . Appendicitis 09/08/2018  . Vitamin D deficiency 05/07/2018  . Puncture wound of right hip 04/02/2018  . Bradycardia 03/25/2018  . Malaise and fatigue 03/25/2018  . Subacute osteomyelitis of left foot ([place]) 03/25/2018  . Atherosclerosis of native arteries of the extremities with ulceration ([place]) 02/24/2018  . Benign hypertension with CKD (chronic kidney disease) stage IV ([place]) 02/24/2018  . Screening for colon cancer 02/18/2018  . Benign prostatic hyperplasia without lower urinary tract symptoms 01/14/2018  . Erectile dysfunction 07/14/2017  . Hypothyroidism 11/14/2015  . Hematuria 07/27/2015  . Type 2 diabetes mellitus with stage 3 chronic kidney disease,  with long-term current use of insulin ([place]) 07/26/2015  . PVD (peripheral vascular disease) with claudication ([place]) 07/26/2015  . CHF (congestive heart failure) ([place]) 07/26/2015  . Bruit of right carotid artery 07/26/2015  . CAD (coronary artery disease) 07/26/2015  . Cardiac murmur 07/26/2015  . Dysphagia 07/26/2015  . High risk medication use 07/26/2015  . Hyperlipidemia 07/26/2015  . Sleep apnea 07/26/2015  . Chronic combined systolic and diastolic CHF (congestive heart failure) ([place]) 07/26/2015  . Type 2 diabetes mellitus with stage 4 chronic kidney disease, with  long-term current use of insulin ([place]) 07/26/2015    Past Surgical History:  Procedure Laterality Date  . AMPUTATION TOE Left 03/13/2018   Procedure: AMPUTATION TOE-MPJ;  Surgeon: Samara Deist, DPM;  Location: [facility] ORS;  Service: Podiatry;  Laterality: Left;  . ANGIOPLASTY    . CARDIAC CATHETERIZATION    . CARDIAC SURGERY     heart bypass (4)  . CORONARY ARTERY BYPASS GRAFT    . LAPAROSCOPIC APPENDECTOMY N/A 09/08/2018   Procedure: APPENDECTOMY LAPAROSCOPIC;  Surgeon: Jules Husbands, MD;  Location: [facility] ORS;  Service: General;  Laterality: N/A;  . LOWER EXTREMITY ANGIOGRAPHY Left 03/02/2018   Procedure: LOWER EXTREMITY ANGIOGRAPHY;  Surgeon: Algernon Huxley, MD;  Location: [facility] [facility];  Service: Cardiovascular;  Laterality: Left;  . ROTATOR CUFF REPAIR Left     Prior to Admission medications   Medication Sig Start Date End Date Taking? Authorizing Provider  acetaminophen (TYLENOL) 500 MG tablet Take 1,000 mg by mouth every 6 (six) hours as needed for moderate pain or headache.     [provider]  aspirin 81 MG tablet Take 81 mg by mouth daily.    [provider]  bumetanide (BUMEX) 1 MG tablet Take 1 mg by mouth 2 (two) times daily. 11/01/19   [provider]  carvedilol (COREG) 6.25 MG tablet Take 6.25 mg by mouth 2 (two) times daily with a meal.  11/14/15   [provider]  cloNIDine (CATAPRES) 0.3 MG tablet Take 0.3 mg by mouth 2 (two) times daily.     [provider]  clopidogrel (PLAVIX) 75 MG tablet Take 1 tablet (75 mg total) by mouth daily. 03/02/18   Algernon Huxley, MD  cyclobenzaprine (FLEXERIL) 10 MG tablet SMARTSIG:1-2.5 Tablet(s) By Mouth Every 8 Hours PRN 12/02/18   [provider]  gabapentin (NEURONTIN) 600 MG tablet Take 600 mg by mouth 2 (two) times daily.     [provider]  isosorbide mononitrate (IMDUR) 120 MG 24 hr tablet Take 120 mg by mouth daily.    [provider]  LEVEMIR FLEXTOUCH 100  UNIT/ML Pen USE AS DIRECTED ONLY STARTING AT 10 UNITS AT NIGHT. MAXIMUM DOSE 50 UNITS DAILY 12/02/18   [provider]  levothyroxine (SYNTHROID) 25 MCG tablet Take 25 mcg by mouth daily.  10/09/18   [provider]  lisinopril (ZESTRIL) 40 MG tablet Take 40 mg by mouth daily. 04/12/19   [provider]  lovastatin (MEVACOR) 40 MG tablet Take 0.5 tablets (20 mg total) by mouth at bedtime. 08/12/19   Revankar, Reita Cliche, MD  nitroGLYCERIN (NITROSTAT) 0.4 MG SL tablet Place 0.4 mg under the tongue every 5 (five) minutes as needed for chest pain.     [provider]  vitamin B-12 (CYANOCOBALAMIN) 500 MCG tablet Take by mouth.    [provider]    Allergies Patient has no known allergies.  Family History  Problem Relation Age of Onset  .  Hyperlipidemia Mother   . Hypertension Mother   . Diabetes Mother   . Heart attack Brother   . Heart disease Brother   . Lung disease Father     Social History Social History   Tobacco Use  . Smoking status: Former Smoker    Quit date: 2003    Years since quitting: 18.7  . Smokeless tobacco: Never Used  Vaping Use  . Vaping Use: Never used  Substance Use Topics  . Alcohol use: Not Currently    Comment: 2003  . Drug use: Not Currently    Types: Marijuana    Review of Systems  Constitutional: No fever/chills Eyes: No visual changes. ENT: No sore throat. Cardiovascular: Denies chest pain. Respiratory: Denies shortness of breath. Gastrointestinal: No abdominal pain.  No nausea, no vomiting.  No diarrhea.  No constipation. Genitourinary: Negative for dysuria. Musculoskeletal: Positive for back pain, leg pain and sciatica. Skin: Negative for rash. Neurological: Negative for headaches.  Positive for right leg weakness associated with pain  ____________________________________________   PHYSICAL EXAM:  VITAL SIGNS: Vitals:   02/18/20 2104 02/18/20 2105  BP:  (!) 175/85  Pulse:  79  Resp:  14    Temp:    SpO2: 100% 100%      Constitutional: Alert and oriented. Well appearing and in no acute distress.  Pleasant and conversational full sentences. Eyes: Conjunctivae are normal. PERRL. EOMI. Head: Atraumatic. Nose: No congestion/rhinnorhea. Mouth/Throat: Mucous membranes are moist.  Oropharynx non-erythematous. Neck: No stridor. No cervical spine tenderness to palpation. Cardiovascular: Normal rate, regular rhythm. Grossly normal heart sounds.  Good peripheral circulation. Respiratory: Normal respiratory effort.  No retractions. Lungs CTAB. Gastrointestinal: Soft , nondistended, nontender to palpation. No abdominal bruits. No CVA tenderness. Musculoskeletal: No lower extremity tenderness nor edema.  No joint effusions. No signs of acute trauma.  No signs of trauma or asymmetry to his right leg.  Neurologic:  Normal speech and language.  Cranial nerves II through XII intact Sensation intact throughout without deficits to bilateral upper or lower extremities. Full strength and sensation to upper extremities 4/5 strength to right foot plantar flexion, that is asymmetric compared to the left.  Limited active right-sided hip flexion due to pain making strength testing difficult.  Appears to be full 5/5 strength with effort.  Skin:  Skin is warm, dry and intact. No rash noted. Psychiatric: Mood and affect are normal. Speech and behavior are normal.  ____________________________________________   LABS (all labs ordered are listed, but only abnormal results are displayed)  Labs Reviewed  COMPREHENSIVE METABOLIC PANEL - Abnormal; Notable for the following components:      Result Value   Glucose, Bld 272 (*)    BUN 91 (*)    Creatinine, Ser 3.55 (*)    Calcium 8.7 (*)    Total Protein 8.3 (*)    GFR calc non Af Amer 17 (*)    GFR calc Af Amer 20 (*)    All other components within normal limits  CBC - Abnormal; Notable for the following components:   RBC 4.20 (*)    Hemoglobin  12.3 (*)    HCT 36.2 (*)    All other components within normal limits  SARS CORONAVIRUS 2 BY RT PCR (HOSPITAL ORDER, [place] [facility])   ____________________________________________  RADIOLOGY  ED MD interpretation:    Official radiology report(s): CT PELVIS WO CONTRAST  Result Date: 02/18/2020 CLINICAL DATA:  Pelvic pain, stress fracture suspected, severe right hip pain and  sciatica. Groin pain radiating to the leg, unable to bear weight since Tuesday EXAM: CT PELVIS WITHOUT CONTRAST TECHNIQUE: Multidetector CT imaging of the pelvis was performed following the standard protocol without intravenous contrast. COMPARISON:  CT abdomen pelvis 10/05/2018, hip radiograph 02/18/2020 FINDINGS: Urinary Tract: Bladder distention with indentation of the bladder base by an enlarged prostate. No bladder wall thickening, visible calculi or debris. Distal ureters are unremarkable. Bowel: No abnormal bowel wall thickening, dilatation or evidence of obstruction. Scattered colonic diverticula without focal inflammation to suggest diverticulitis. The appendix is surgically absent. Vascular/Lymphatic: Aortoiliac atherosclerosis with additional calcifications in the included proximal outflow vasculature as well. Luminal evaluation precluded in the absence of contrast media. No suspicious or enlarged lymph nodes in the included lymphatic chains. Reproductive: Mild prostatomegaly with slight indentation of the bladder base by some median lobe hypertrophy. Seminal vesicles are unremarkable. Other: Mild body wall edema. No bowel containing hernia. Small fat containing inguinal hernias. No free pelvic air or fluid. Musculoskeletal: Redemonstration of 8 mm of grade 1 anterolisthesis L5 on S1 with associated bilateral L5 pars defects which appear well corticated, chronic and unchanged from comparison study. Slight retrolisthesis of L4 on L5 is also unchanged from comparison. Discogenic and facet degenerative  changes are present at the included L4-S1 levels. Mild canal stenosis and moderate bilateral foraminal narrowing L4-L5 and severe bilateral foraminal narrowing L5-S1, right greater than left with compression of the exiting nerve root (7/89 which is similar to prior but could present is a pain generator in this patient. The sacrum is intact. No sacral insufficiency fractures are identified. Partial ankylosis along superior margins of the bilateral SI joints with mild underlying sacroiliac arthrosis unchanged from comparison. Mild angulation at the sacrococcygeal junction is chronic and unchanged from prior. There is moderate right and mild left hip osteoarthrosis including periacetabular spurring, subchondral sclerosis and cyst formation. Femoral heads however remain normally located in the proximal femora and cells are intact. These features are progressive from comparison imaging. IMPRESSION: 1. No acute fracture or traumatic osseous injuries. No suspicious osseous lesions. 2. Unchanged 8 mm of grade 1 anterolisthesis L5 on S1 with associated bilateral L5 pars defects which appear well corticated, chronic and unchanged from comparison study. 3. Moderate right and mild left hip osteoarthrosis, progressive from comparison imaging. 4. Discogenic and facet degenerative changes at L4-L5 and L5-S1 with severe bilateral foraminal narrowing L5-S1, right greater than left, with resulting compression of the exiting nerve roots. 5. Moderate bladder distention with indentation of the bladder base by an enlarged prostate. Correlate for symptoms of outlet obstruction. 6. Diverticulosis without evidence of diverticulitis. 7. Prior appendectomy. 8. Aortic Atherosclerosis (ICD10-I70.0). Electronically Signed   By: Lovena Le M.D.   On: 02/18/2020 22:15   DG Hip Unilat W or Wo Pelvis 2-3 Views Right  Result Date: 02/18/2020 CLINICAL DATA:  Right hip and groin pain for the last 4 days. Pain with movement. No injury. EXAM: DG  HIP (WITH OR WITHOUT PELVIS) 2-3V RIGHT COMPARISON:  None. FINDINGS: No fracture or bone lesion. Hip joints are normally spaced and aligned. On the right, there is bony productive change along the inferior margin of the acetabulum. SI joints and symphysis pubis are normally spaced and aligned. Arterial atherosclerotic calcifications are noted along the iliac and femoral vessels. Soft tissues are otherwise unremarkable. IMPRESSION: 1. No fracture or acute finding. 2. Mild chronic appearing bony productive changes along the inferior right acetabulum. No other degenerative/arthropathic changes. Electronically Signed   By: Dedra Skeens.D.  On: 02/18/2020 14:21    ____________________________________________   PROCEDURES and INTERVENTIONS  Procedure(s) performed (including Critical Care):  Procedures  Medications  acetaminophen (TYLENOL) tablet 1,000 mg (has no administration in time range)  methocarbamol (ROBAXIN) tablet 500 mg (has no administration in time range)  lactated ringers bolus 500 mL (500 mLs Intravenous New Bag/Given 02/18/20 2135)    ____________________________________________   MDM / ED COURSE  64 year old male presenting from home with acute on chronic sciatica with associated weakness, as well as AKI CKD, requiring medical observation admission.  Chronic hypertension at baseline, otherwise normal vital signs on room air.  Labs demonstrating AKI on CKD that appears to be prerenal azotemia in the setting of his poor p.o. intake in setting of poor ambulation with pain.  Exam does demonstrate weakness to L5/S1 distribution with poor foot/ankle strength on the right compared to the left.  He has no other focal neurologic deficits.  He has no back pain, headache, signs of trauma or history of trauma.  He is outside of any possible stroke window.  Plain films performed in triage are unremarkable of his pelvis.  Due to the severity of his hip pain, proceeded with CT imaging without  contrast and this captures severe foraminal stenosis to right sided L5/S1, as a likely source of his worsening pain.  Due to the associated weakness in his leg, ordered MRI without contrast of his lumbar spine.  For his AKI on CKD, patient received 500 cc of LR.  Due to his medical complexity, severity of symptoms and need for MRI, will admit the patient to hospitalist medicine for further work-up and management of his worsening sciatica and AKI on CKD.  Clinical Course as of Feb 17 2300  Fri Feb 18, 2020  2240 Educated patient on my concern for acute lumbar cord compression and need for MRI for this evaluation due to his weakness with worsening sciatic pain.  We further discussed medical observation admission to facilitate pain control, MRI follow-up and treatment of AKI on CKD.   [DS]  2247 Spoke with Dr. Posey Pronto with hospitalist group who agrees to admit the patient   [DS]    Clinical Course User Index [DS] Vladimir Crofts, MD     ____________________________________________   FINAL CLINICAL IMPRESSION(S) / ED DIAGNOSES  Final diagnoses:  Sciatica of right side  AKI (acute kidney injury) ([place])  Right leg weakness     ED Discharge Orders    None       Aeliana Spates   Note:  This document was prepared using Dragon voice recognition software and may include unintentional dictation errors.   Vladimir Crofts, MD 02/18/20 [phone]

## 2020-02-19 ENCOUNTER — Other Ambulatory Visit: Payer: Self-pay

## 2020-02-19 ENCOUNTER — Observation Stay: Payer: PPO

## 2020-02-19 ENCOUNTER — Encounter: Payer: Self-pay | Admitting: Family Medicine

## 2020-02-19 DIAGNOSIS — E039 Hypothyroidism, unspecified: Secondary | ICD-10-CM | POA: Diagnosis present

## 2020-02-19 DIAGNOSIS — N184 Chronic kidney disease, stage 4 (severe): Secondary | ICD-10-CM | POA: Diagnosis present

## 2020-02-19 DIAGNOSIS — E1165 Type 2 diabetes mellitus with hyperglycemia: Secondary | ICD-10-CM | POA: Diagnosis present

## 2020-02-19 DIAGNOSIS — I130 Hypertensive heart and chronic kidney disease with heart failure and stage 1 through stage 4 chronic kidney disease, or unspecified chronic kidney disease: Secondary | ICD-10-CM | POA: Diagnosis present

## 2020-02-19 DIAGNOSIS — M25551 Pain in right hip: Secondary | ICD-10-CM | POA: Diagnosis not present

## 2020-02-19 DIAGNOSIS — I2510 Atherosclerotic heart disease of native coronary artery without angina pectoris: Secondary | ICD-10-CM | POA: Diagnosis present

## 2020-02-19 DIAGNOSIS — M48061 Spinal stenosis, lumbar region without neurogenic claudication: Secondary | ICD-10-CM | POA: Diagnosis present

## 2020-02-19 DIAGNOSIS — J449 Chronic obstructive pulmonary disease, unspecified: Secondary | ICD-10-CM | POA: Diagnosis present

## 2020-02-19 DIAGNOSIS — E1122 Type 2 diabetes mellitus with diabetic chronic kidney disease: Secondary | ICD-10-CM | POA: Diagnosis not present

## 2020-02-19 DIAGNOSIS — I152 Hypertension secondary to endocrine disorders: Secondary | ICD-10-CM | POA: Diagnosis present

## 2020-02-19 DIAGNOSIS — I129 Hypertensive chronic kidney disease with stage 1 through stage 4 chronic kidney disease, or unspecified chronic kidney disease: Secondary | ICD-10-CM | POA: Diagnosis not present

## 2020-02-19 DIAGNOSIS — M47816 Spondylosis without myelopathy or radiculopathy, lumbar region: Secondary | ICD-10-CM | POA: Diagnosis present

## 2020-02-19 DIAGNOSIS — R600 Localized edema: Secondary | ICD-10-CM | POA: Diagnosis not present

## 2020-02-19 DIAGNOSIS — N179 Acute kidney failure, unspecified: Secondary | ICD-10-CM | POA: Diagnosis present

## 2020-02-19 DIAGNOSIS — N1330 Unspecified hydronephrosis: Secondary | ICD-10-CM | POA: Diagnosis not present

## 2020-02-19 DIAGNOSIS — I5042 Chronic combined systolic (congestive) and diastolic (congestive) heart failure: Secondary | ICD-10-CM | POA: Diagnosis present

## 2020-02-19 DIAGNOSIS — E1169 Type 2 diabetes mellitus with other specified complication: Secondary | ICD-10-CM | POA: Diagnosis present

## 2020-02-19 DIAGNOSIS — E1142 Type 2 diabetes mellitus with diabetic polyneuropathy: Secondary | ICD-10-CM | POA: Diagnosis present

## 2020-02-19 DIAGNOSIS — E785 Hyperlipidemia, unspecified: Secondary | ICD-10-CM | POA: Diagnosis present

## 2020-02-19 DIAGNOSIS — D631 Anemia in chronic kidney disease: Secondary | ICD-10-CM | POA: Diagnosis present

## 2020-02-19 DIAGNOSIS — Z794 Long term (current) use of insulin: Secondary | ICD-10-CM | POA: Diagnosis not present

## 2020-02-19 DIAGNOSIS — G8929 Other chronic pain: Secondary | ICD-10-CM | POA: Diagnosis present

## 2020-02-19 DIAGNOSIS — Z20822 Contact with and (suspected) exposure to covid-19: Secondary | ICD-10-CM | POA: Diagnosis present

## 2020-02-19 DIAGNOSIS — N189 Chronic kidney disease, unspecified: Secondary | ICD-10-CM | POA: Diagnosis not present

## 2020-02-19 DIAGNOSIS — M160 Bilateral primary osteoarthritis of hip: Secondary | ICD-10-CM | POA: Diagnosis present

## 2020-02-19 DIAGNOSIS — E1151 Type 2 diabetes mellitus with diabetic peripheral angiopathy without gangrene: Secondary | ICD-10-CM | POA: Diagnosis present

## 2020-02-19 DIAGNOSIS — M1611 Unilateral primary osteoarthritis, right hip: Secondary | ICD-10-CM | POA: Diagnosis not present

## 2020-02-19 DIAGNOSIS — R809 Proteinuria, unspecified: Secondary | ICD-10-CM | POA: Diagnosis not present

## 2020-02-19 DIAGNOSIS — M25451 Effusion, right hip: Secondary | ICD-10-CM | POA: Diagnosis not present

## 2020-02-19 DIAGNOSIS — M5431 Sciatica, right side: Secondary | ICD-10-CM | POA: Diagnosis present

## 2020-02-19 DIAGNOSIS — Z87891 Personal history of nicotine dependence: Secondary | ICD-10-CM | POA: Diagnosis not present

## 2020-02-19 DIAGNOSIS — Z83438 Family history of other disorder of lipoprotein metabolism and other lipidemia: Secondary | ICD-10-CM | POA: Diagnosis not present

## 2020-02-19 HISTORY — DX: Acute kidney failure, unspecified: N17.9

## 2020-02-19 LAB — CBC
HCT: 32.6 % — ABNORMAL LOW (ref 39.0–52.0)
Hemoglobin: 10.7 g/dL — ABNORMAL LOW (ref 13.0–17.0)
MCH: 29 pg (ref 26.0–34.0)
MCHC: 32.8 g/dL (ref 30.0–36.0)
MCV: 88.3 fL (ref 80.0–100.0)
Platelets: 139 10*3/uL — ABNORMAL LOW (ref 150–400)
RBC: 3.69 MIL/uL — ABNORMAL LOW (ref 4.22–5.81)
RDW: 13.6 % (ref 11.5–15.5)
WBC: 7.1 10*3/uL (ref 4.0–10.5)
nRBC: 0 % (ref 0.0–0.2)

## 2020-02-19 LAB — GLUCOSE, CAPILLARY
Glucose-Capillary: 240 mg/dL — ABNORMAL HIGH (ref 70–99)
Glucose-Capillary: 208 mg/dL — ABNORMAL HIGH (ref 70–99)
Glucose-Capillary: 209 mg/dL — ABNORMAL HIGH (ref 70–99)
Glucose-Capillary: 264 mg/dL — ABNORMAL HIGH (ref 70–99)

## 2020-02-19 LAB — HEMOGLOBIN A1C
Hgb A1c MFr Bld: 9.2 % — ABNORMAL HIGH (ref 4.8–5.6)
Mean Plasma Glucose: 217.34 mg/dL

## 2020-02-19 LAB — URINALYSIS, ROUTINE W REFLEX MICROSCOPIC
Bacteria, UA: NONE SEEN
Bilirubin Urine: NEGATIVE
Glucose, UA: 50 mg/dL — AB
Ketones, ur: NEGATIVE mg/dL
Leukocytes,Ua: NEGATIVE
Nitrite: NEGATIVE
Protein, ur: 100 mg/dL — AB
Specific Gravity, Urine: 1.012 (ref 1.005–1.030)
Squamous Epithelial / HPF: NONE SEEN (ref 0–5)
pH: 5 (ref 5.0–8.0)

## 2020-02-19 LAB — CREATININE, URINE, RANDOM: Creatinine, Urine: 70 mg/dL

## 2020-02-19 LAB — BASIC METABOLIC PANEL
Anion gap: 11 (ref 5–15)
BUN: 88 mg/dL — ABNORMAL HIGH (ref 8–23)
CO2: 27 mmol/L (ref 22–32)
Calcium: 8.7 mg/dL — ABNORMAL LOW (ref 8.9–10.3)
Chloride: 101 mmol/L (ref 98–111)
Creatinine, Ser: 3.2 mg/dL — ABNORMAL HIGH (ref 0.61–1.24)
GFR calc Af Amer: 22 mL/min — ABNORMAL LOW (ref >60)
GFR calc non Af Amer: 19 mL/min — ABNORMAL LOW (ref >60)
Glucose, Bld: 327 mg/dL — ABNORMAL HIGH (ref 70–99)
Potassium: 3.4 mmol/L — ABNORMAL LOW (ref 3.5–5.1)
Sodium: 139 mmol/L (ref 135–145)

## 2020-02-19 LAB — BRAIN NATRIURETIC PEPTIDE: B Natriuretic Peptide: 406.2 pg/mL — ABNORMAL HIGH (ref 0.0–100.0)

## 2020-02-19 LAB — SODIUM, URINE, RANDOM: Sodium, Ur: 99 mmol/L

## 2020-02-19 LAB — HIV ANTIBODY (ROUTINE TESTING W REFLEX): HIV Screen 4th Generation wRfx: NONREACTIVE

## 2020-02-19 IMAGING — US US RENAL
1 series · 14 of 25 positions shown · non-contrast
Comparison: None.

CLINICAL DATA: Acute renal insufficiency.

EXAM:
RENAL / URINARY TRACT ULTRASOUND COMPLETE

[Series 1: us renal · 14 of 54 slices shown]
[im 1/54]
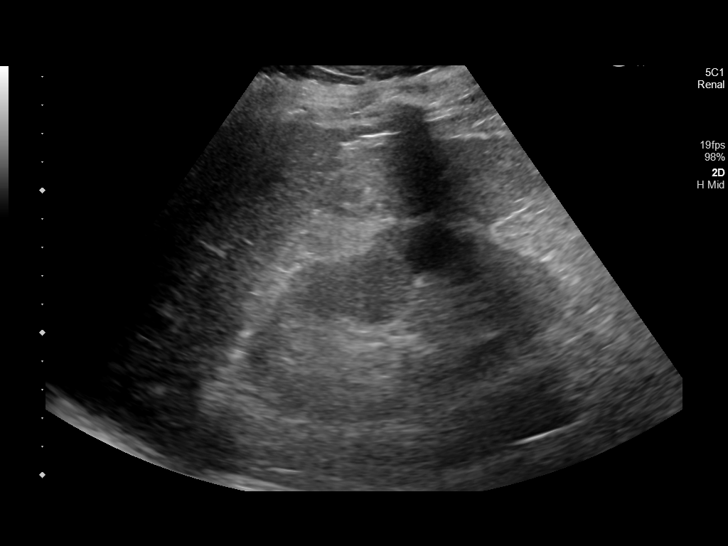
[im 5/54]
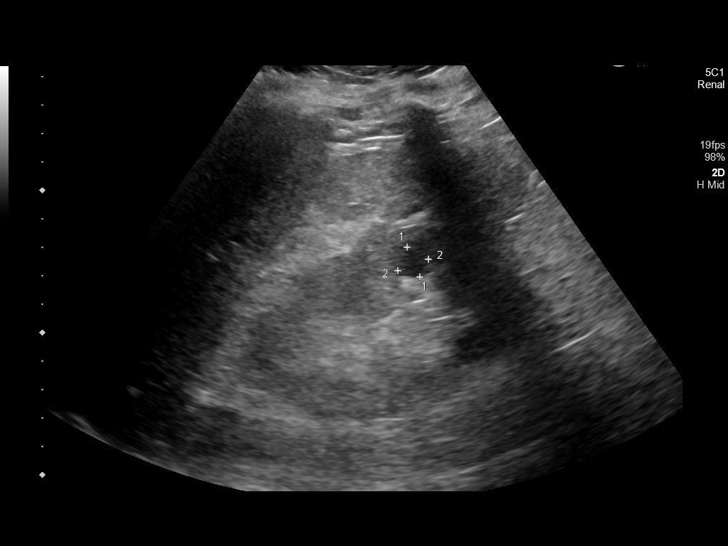
[im 9/54]
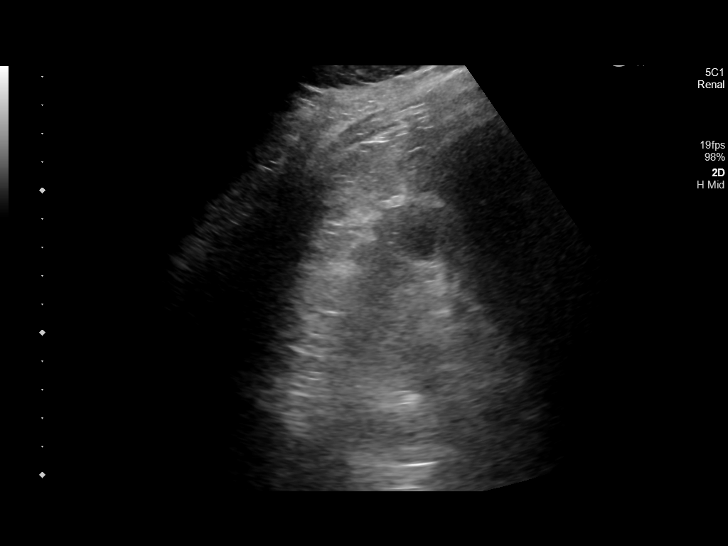
[im 14/54]
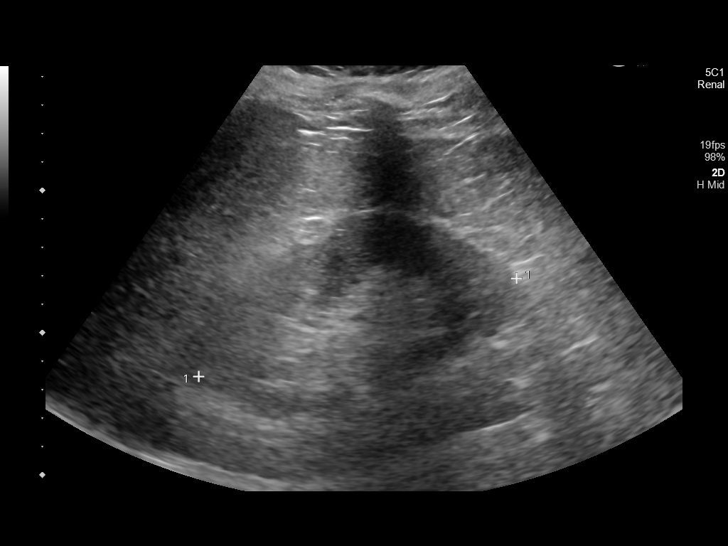
[im 18/54]
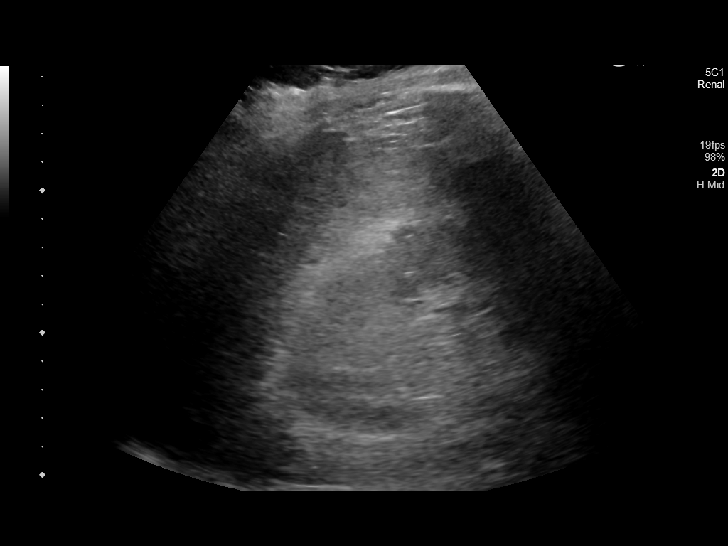
[im 20/54]
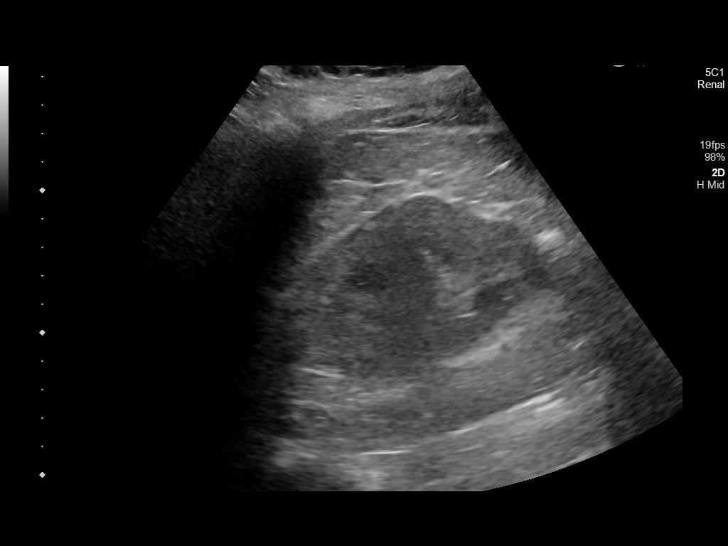
[im 25/54]
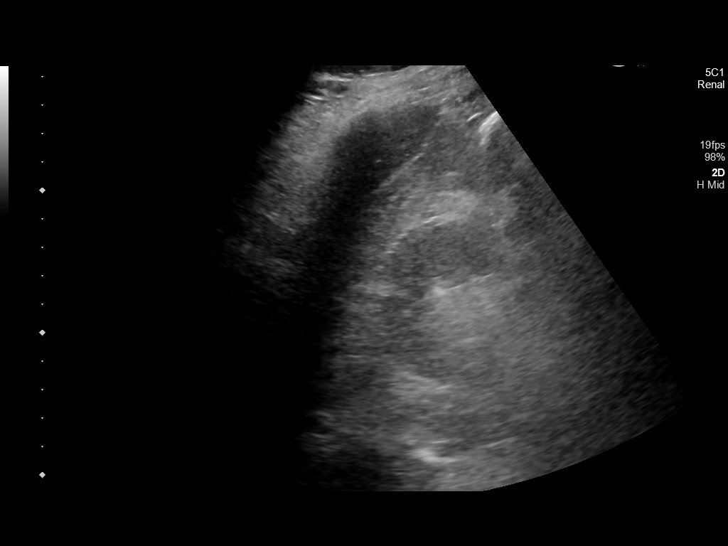
[im 29/54]
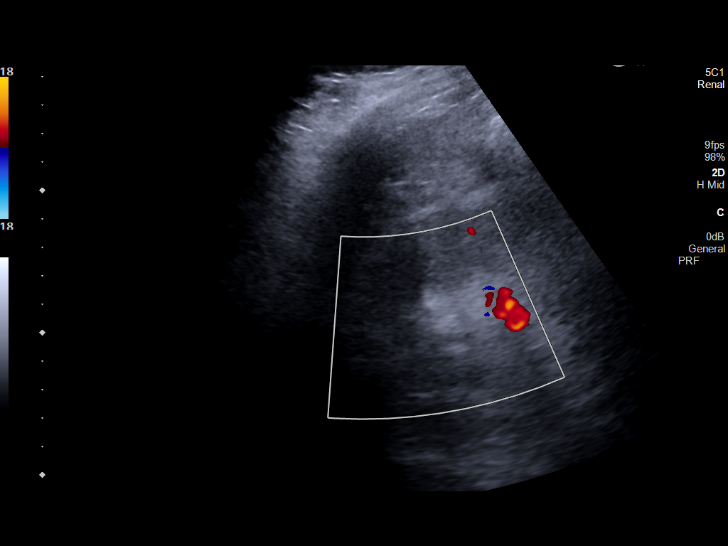
[im 34/54]
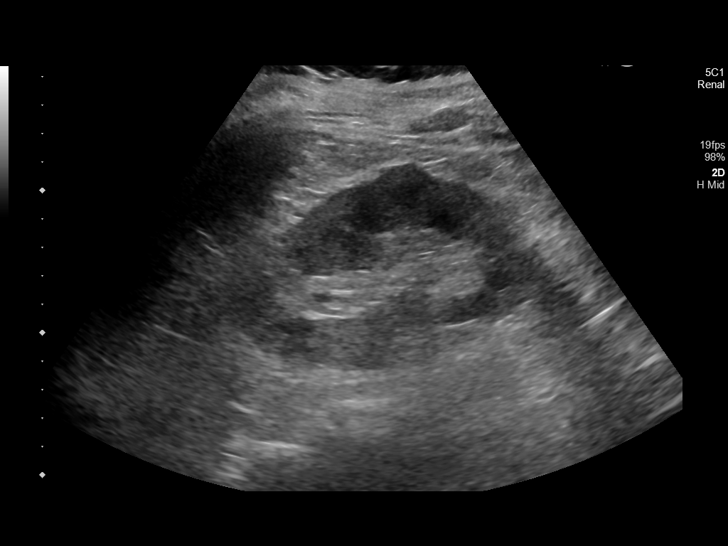
[im 36/54]
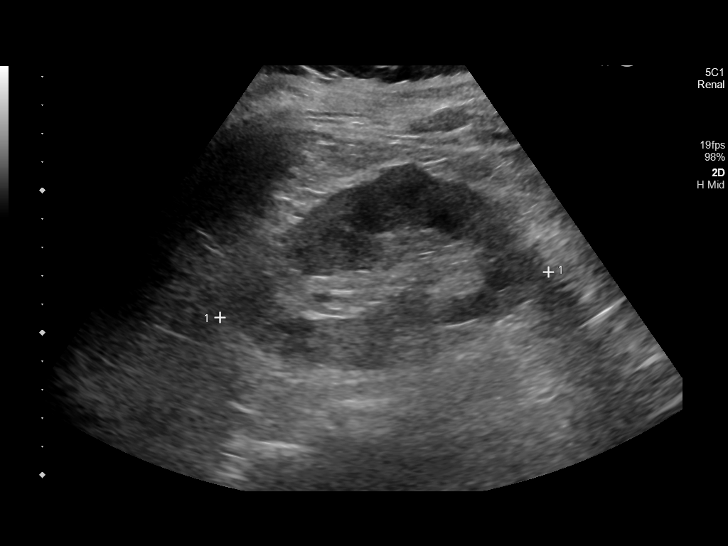
[im 40/54]
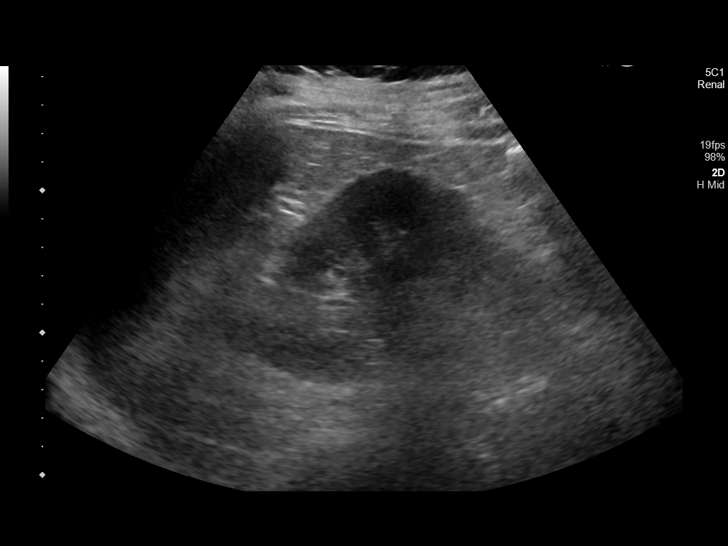
[im 45/54]
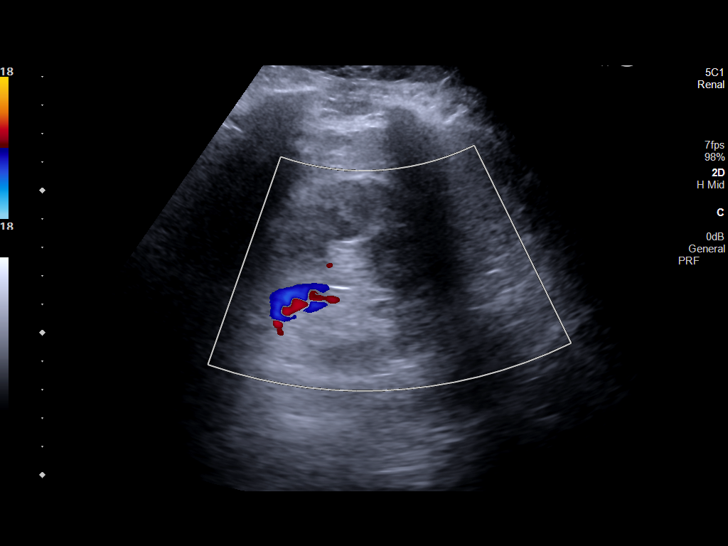
[im 49/54]
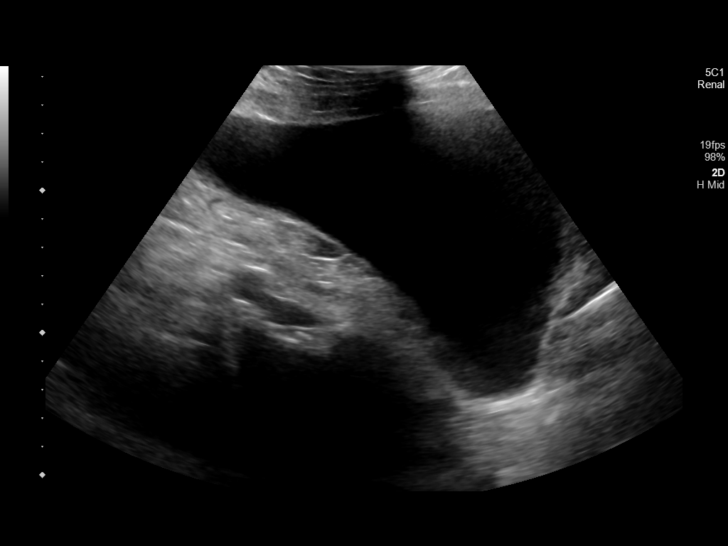
[im 54/54]
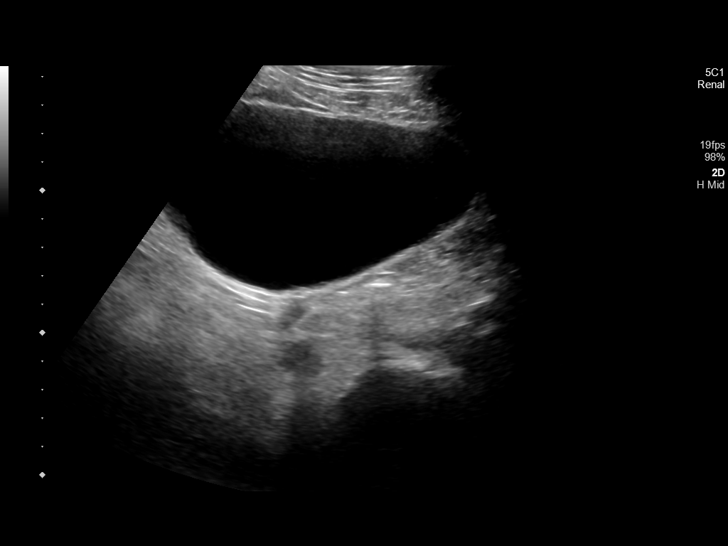

[14 of 25 positions shown; findings below may reference images not displayed]

FINDINGS: Right Kidney:

Renal measurements: 11.7 cm x 4.5 cm x 5.8 cm = volume: 161.7 mL.
There is diffusely increased echogenicity of the renal parenchyma. A
1.1 cm x 1.2 cm x 1.4 cm anechoic structure is seen within the lower
pole of the right kidney. This demonstrates no flow on color Doppler
evaluation. No associated soft tissue component is seen. No
hydronephrosis visualized.

Left Kidney:

Renal measurements: 11.7 cm x 6.8 cm x 6.1 cm = volume: 254.7 mL.
There is diffusely increased echogenicity of the renal parenchyma.
No mass or hydronephrosis visualized.

Bladder:

Appears normal for degree of bladder distention.

Other:

None.
IMPRESSION: 1. Increased echogenicity of the renal parenchyma, likely secondary
to medical renal disease.
2. Right renal cyst.

## 2020-02-19 IMAGING — MR MR HIP*R* W/O CM
4 of 5 series · 26 of 40 positions shown · non-contrast
Comparison: None.

CLINICAL DATA: Right hip and groin pain.

EXAM:
MR OF THE RIGHT HIP WITHOUT CONTRAST
TECHNIQUE: Multiplanar, multisequence MR imaging was performed. No intravenous
contrast was administered.

[Series 3: T1 · coronal · right · 4.0mm · 0.62mm/px · 5 of 38 slices shown]
[im 1/38]
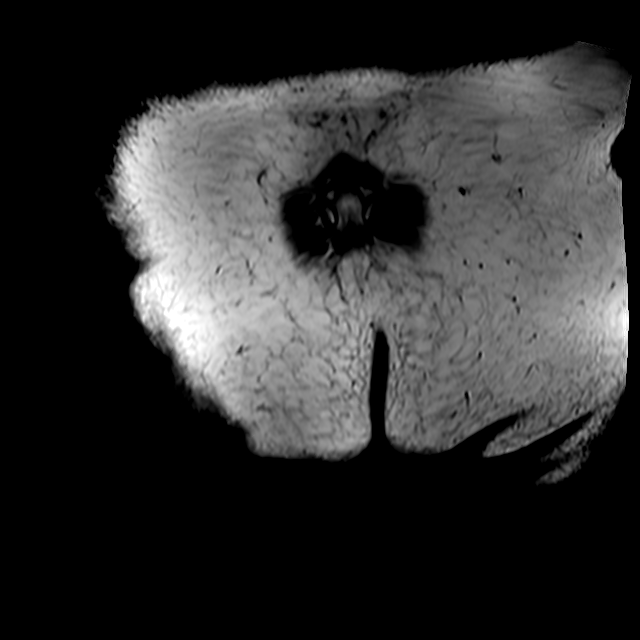
[im 5/38]
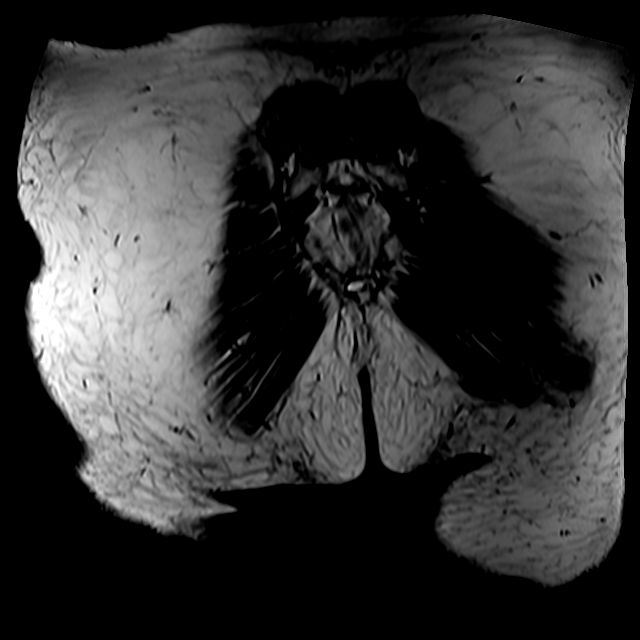
[im 13/38]
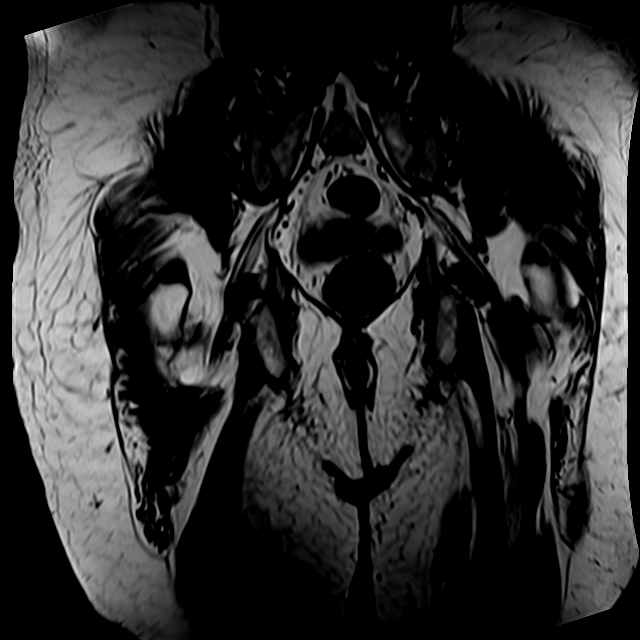
[im 21/38]
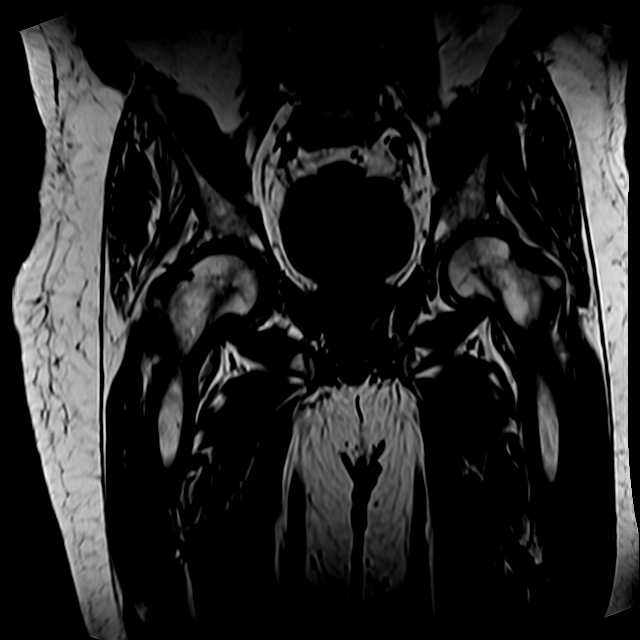
[im 33/38]
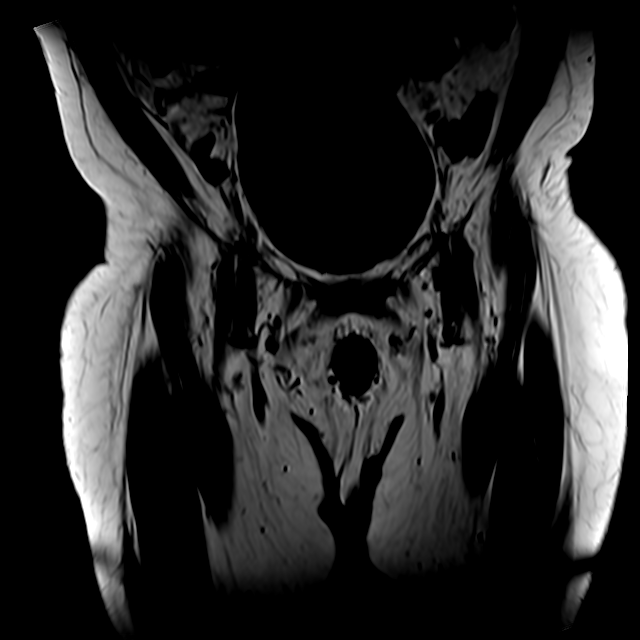

[Series 5: T2 fat-sat · axial · right · 4.0mm · 0.35mm/px · z∈[-162,-17]mm · 7 of 30 slices shown]
[im 1/30]
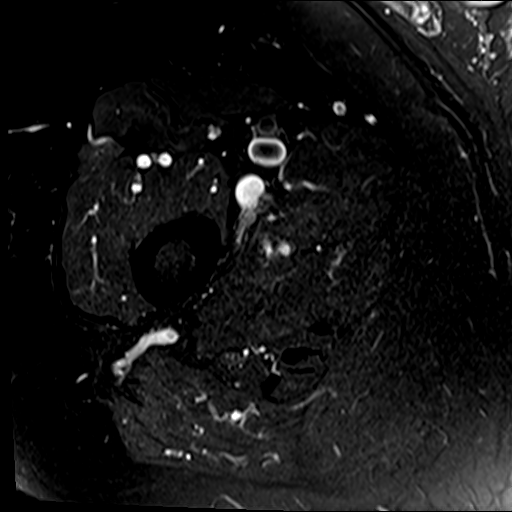
[im 5/30]
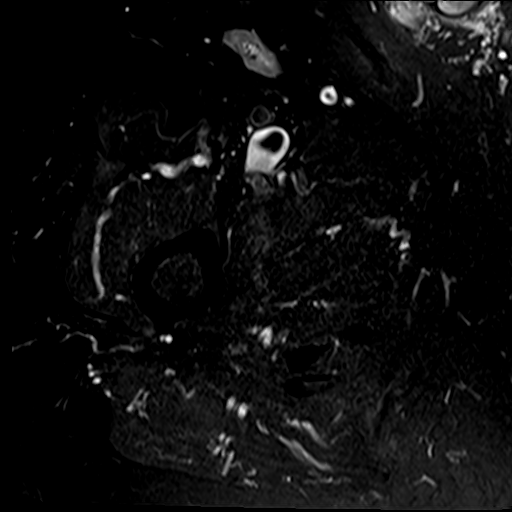
[im 10/30]
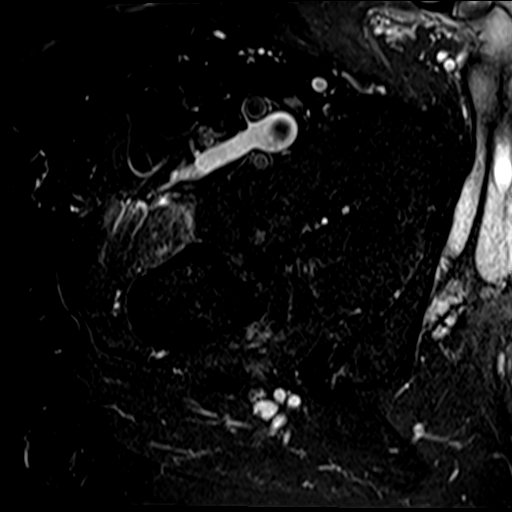
[im 15/30]
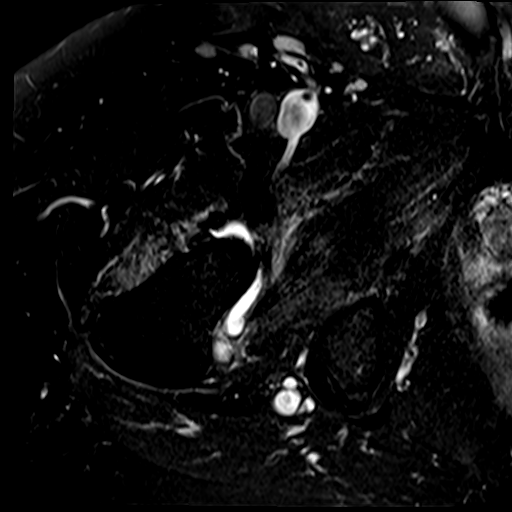
[im 20/30]
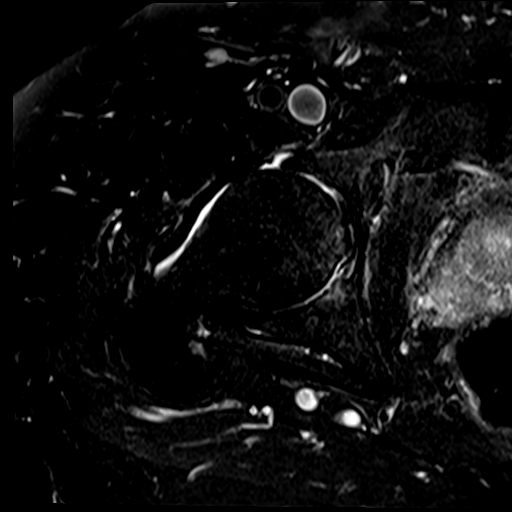
[im 25/30]
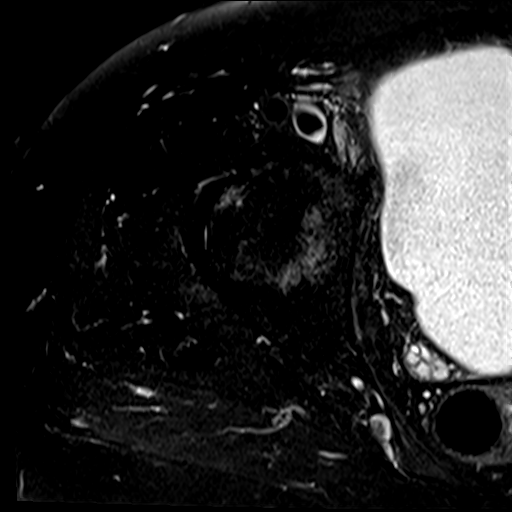
[im 30/30]
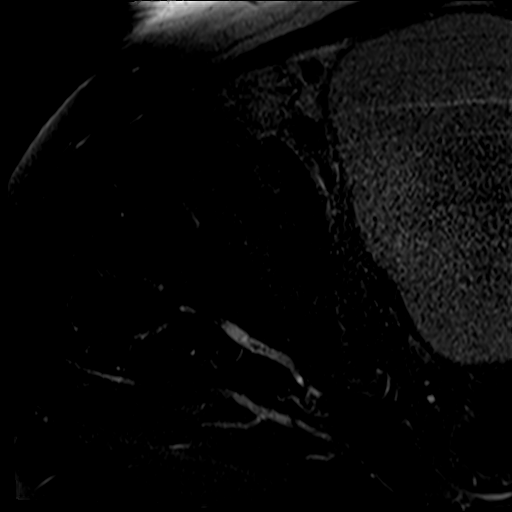

[Series 6: PD fat-sat · sagittal · right · 4.0mm · 0.70mm/px · 7 of 29 slices shown (1 of 2)]
[im 1/29]
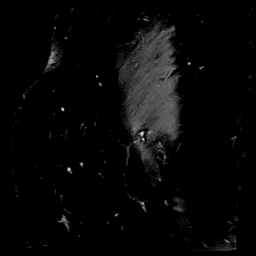
[im 5/29]
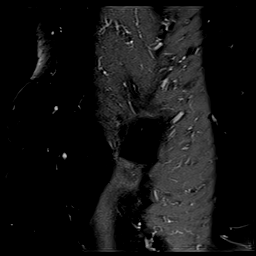
[im 10/29]
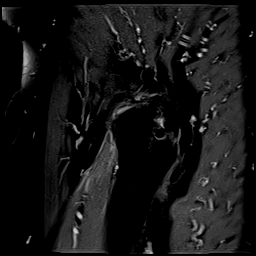
[im 15/29]
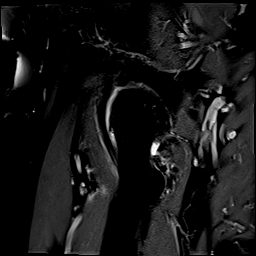
[im 19/29]
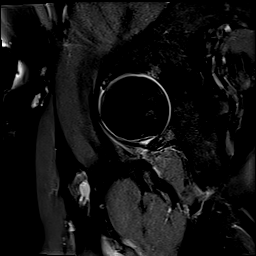
[im 24/29]
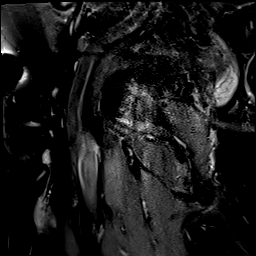
[im 29/29]
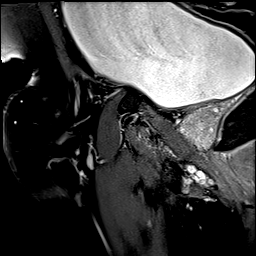

[Series 7: PD fat-sat · coronal · right · 4.0mm · 0.70mm/px · 7 of 29 slices shown (2 of 2)]
[im 1/29]
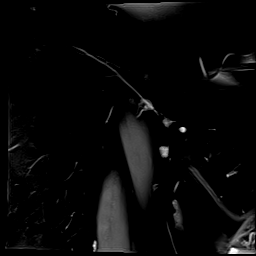
[im 5/29]
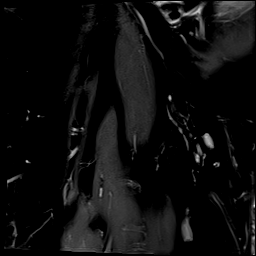
[im 10/29]
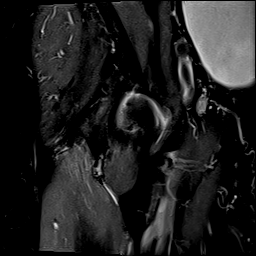
[im 15/29]
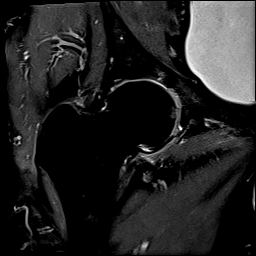
[im 19/29]
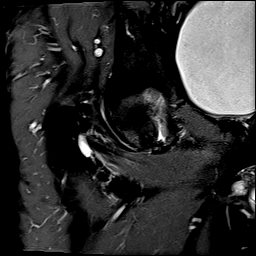
[im 24/29]
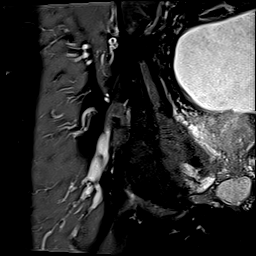
[im 29/29]
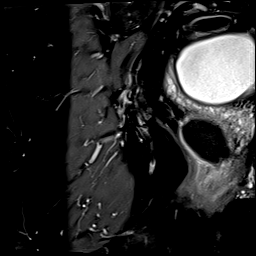

[26 of 40 positions shown; findings below may reference images not displayed]

FINDINGS: Bones:

No hip fracture, dislocation or avascular necrosis. No periosteal
reaction or bone destruction. No aggressive osseous lesion.

Normal sacrum and sacroiliac joints. No SI joint widening or erosive
changes.

Degenerative disease with disc height loss and facet arthropathy at
L4-5 and L5-S1.

Articular cartilage and labrum

Articular cartilage: High-grade partial-thickness cartilage loss of
the right femoral head and acetabulum with subchondral marrow edema
in the acetabulum.

Labrum: Right labral degeneration with a tear of the superior and
anterior labrum.

Joint or bursal effusion

Joint effusion: Small right hip joint effusion. No left hip joint
effusion. No SI joint effusion.

Bursae:  No bursa formation.

Muscles and tendons

Flexors: Normal.

Extensors: Mild muscle edema in the vastus intermedius muscle
proximally likely reflecting muscle strain. Remainder of the muscles
are normal.

Abductors: Normal.

Adductors: Normal.

Gluteals: Normal.

Hamstrings: Normal.

Other findings

No pelvic free fluid. No fluid collection or hematoma. No inguinal
lymphadenopathy. No inguinal hernia. Distended bladder.
IMPRESSION: 1. No hip fracture, dislocation or avascular necrosis.
2. Moderate-severe osteoarthritis of the right hip.
3. Right labral degeneration with a tear of the superior and
anterior labrum.

## 2020-02-19 MED ORDER — INSULIN DETEMIR 100 UNIT/ML ~~LOC~~ SOLN
10.0000 [IU] | Freq: Every day | SUBCUTANEOUS | Status: DC
  Administered 2020-02-19 – 2020-02-20 (×2): 10 [IU] via SUBCUTANEOUS
  Filled 2020-02-19 (×4): qty 0.1

## 2020-02-19 NOTE — Consult Note (Signed)
ORTHOPAEDIC CONSULTATION  REQUESTING PHYSICIAN: Deatra James, MD  Chief Complaint:   Right hip pain.  History of Present Illness: Phillip [name] is a 64 y.o. male who lives independently and has an extensive past medical history, including moderate aortic stenosis, hypertension, BPH, coronary artery disease, peripheral vascular disease, adult-onset diabetes, congestive heart failure, chronic lower back pain secondary to degenerative disc disease with a grade 1 anterolisthesis of L5 on S1.  Chronic kidney disease (stage IV), hyperlipidemia, and polyneuropathy, among other diagnoses.  The patient began to notice increased pain in his right hip/groin region 4 days ago.  His symptoms worsened over the next several days to where he had difficulty bearing any weight on his right leg or even putting his foot flat on the ground, prompting him to present to the emergency room last evening.  He denies any trauma to the area, but does note a history of lower back pain with radiation into the right buttock and lower leg.  He denies any changes in bowel or bladder habits, and denies any numbness or paresthesias down his leg to her foot his foot.  He also denies any fevers or chills.  Plain radiographs and a CT scan were performed in the emergency room and were unremarkable for any fracture.  Therefore, the patient was admitted for further evaluation and treatment, including orthopedic consultation.  Past Medical History:  Diagnosis Date  . Abdominopelvic abscess ([place]) 09/29/2018  . Abscess of appendix 09/22/2018  . Acute kidney injury ([place]) 10/07/2018  . Anemia, chronic disease 08/18/2019  . Aortic stenosis, moderate 10/15/2019  . Appendicitis 09/08/2018  . Asthma   . Atherosclerosis of native arteries of the extremities with ulceration ([place]) 02/24/2018   Formatting of this note might be different from the original. Last Assessment & Plan:  His  ABIs today are 1.07 on the right and 0.99 on the left with biphasic waveforms.  Although these pressures may be somewhat elevated from medial calcification, his flow does appear to be reasonably good.  His left ABI was 0.58 prior to intervention. At this point, we will stretch out his follow-up and see him b  . Benign hypertension with CKD (chronic kidney disease) stage IV ([place]) 02/24/2018   Last Assessment & Plan:  blood pressure control important in reducing the progression of atherosclerotic disease. On appropriate oral medications.  . Benign prostatic hyperplasia without lower urinary tract symptoms 01/14/2018  . Bilateral lower extremity edema 11/04/2018  . Bradycardia 03/25/2018  . Bruit of right carotid artery 07/26/2015  . CAD (coronary artery disease) 07/26/2015   Formatting of this note might be different from the original. [name]  Formatting of this note might be different from the original. [name]  . Cardiac murmur 07/26/2015  . CHF (congestive heart failure) ([place])   . Chronic idiopathic constipation 11/02/2018  . Chronic low back pain without sciatica 12/02/2018  . Chronic systolic heart failure ([place]) 07/26/2015   Formatting of this note might be different from the original. [place] EF 35%  . CKD (chronic kidney disease) stage 4, GFR 15-29 ml/min ([place]) 10/07/2018  . Coronary artery disease   . Diabetes mellitus without complication ([place])   . Dysphagia 07/26/2015  . Erectile dysfunction 07/14/2017  . Foot ulcer ([place])    left foot  . GERD (gastroesophageal reflux disease)   . Heart murmur   . Hematuria 07/27/2015  . High risk medication use 07/26/2015  . Hyperlipidemia 07/26/2015  . Hypertension   . Hypoglycemia 05/07/2019  . Hypothyroidism (  acquired) 11/14/2015  . Insomnia 11/02/2018  . Kidney insufficiency   . Malaise and fatigue 03/25/2018  . Mild aortic stenosis 05/12/2019  . Mitral regurgitation 05/12/2019  . Moderate aortic regurgitation 05/12/2019  . Non-compliance 05/05/2019  .  Peripheral vascular disease ([place])   . Polyneuropathy in diabetes ([place]) 05/07/2019  . Primary osteoarthritis involving multiple joints 10/06/2019  . Puncture wound of right hip 04/02/2018  . PVD (peripheral vascular disease) with claudication ([place]) 07/26/2015   Angioplasty 02/2018  . Regional wall motion abnormality of heart 05/07/2019  . Screening for colon cancer 02/18/2018  . Sleep apnea 07/26/2015  . Subacute osteomyelitis of left foot ([place]) 03/25/2018  . Type 2 diabetes mellitus with stage 3 chronic kidney disease, with long-term current use of insulin ([place]) 07/26/2015  . Type 2 diabetes mellitus with stage 4 chronic kidney disease, with long-term current use of insulin ([place]) 07/26/2015  . Vitamin B12 deficiency 06/16/2019  . Vitamin D deficiency 05/07/2018   Past Surgical History:  Procedure Laterality Date  . AMPUTATION TOE Left 03/13/2018   Procedure: AMPUTATION TOE-MPJ;  Surgeon: Samara Deist, DPM;  Location: [facility] ORS;  Service: Podiatry;  Laterality: Left;  . ANGIOPLASTY    . CARDIAC CATHETERIZATION    . CARDIAC SURGERY     heart bypass (4)  . CORONARY ARTERY BYPASS GRAFT    . LAPAROSCOPIC APPENDECTOMY N/A 09/08/2018   Procedure: APPENDECTOMY LAPAROSCOPIC;  Surgeon: Jules Husbands, MD;  Location: [facility] ORS;  Service: General;  Laterality: N/A;  . LOWER EXTREMITY ANGIOGRAPHY Left 03/02/2018   Procedure: LOWER EXTREMITY ANGIOGRAPHY;  Surgeon: Algernon Huxley, MD;  Location: [facility];  Service: Cardiovascular;  Laterality: Left;  . ROTATOR CUFF REPAIR Left    Social History   Socioeconomic History  . Marital status: Married    Spouse name: Not on file  . Number of children: Not on file  . Years of education: Not on file  . Highest education level: Not on file  Occupational History  . Not on file  Tobacco Use  . Smoking status: Former Smoker    Quit date: 2003    Years since quitting: 18.7  . Smokeless tobacco: Never Used  Vaping Use  . Vaping Use: Never used  Substance  and Sexual Activity  . Alcohol use: Not Currently    Comment: 2003  . Drug use: Not Currently    Types: Marijuana  . Sexual activity: Not on file  Other Topics Concern  . Not on file  Social History Narrative  . Not on file   Social Determinants of Health   Financial Resource Strain:   . Difficulty of Paying Living Expenses: Not on file  Food Insecurity:   . Worried About Charity fundraiser in the Last Year: Not on file  . Ran Out of Food in the Last Year: Not on file  Transportation Needs:   . Lack of Transportation (Medical): Not on file  . Lack of Transportation (Non-Medical): Not on file  Physical Activity:   . Days of Exercise per Week: Not on file  . Minutes of Exercise per Session: Not on file  Stress:   . Feeling of Stress : Not on file  Social Connections:   . Frequency of Communication with Friends and Family: Not on file  . Frequency of Social Gatherings with Friends and Family: Not on file  . Attends Religious Services: Not on file  . Active Member of Clubs or Organizations: Not on file  .  Attends Archivist Meetings: Not on file  . Marital Status: Not on file   Family History  Problem Relation Age of Onset  . Hyperlipidemia Mother   . Hypertension Mother   . Diabetes Mother   . Heart attack Brother   . Heart disease Brother   . Lung disease Father    No Known Allergies Prior to Admission medications   Medication Sig Start Date End Date Taking? Authorizing Provider  acetaminophen (TYLENOL) 500 MG tablet Take 1,000 mg by mouth every 6 (six) hours as needed for moderate pain or headache.    Yes [provider]  aspirin 81 MG tablet Take 81 mg by mouth daily.   Yes [provider]  bumetanide (BUMEX) 1 MG tablet Take 1 mg by mouth 2 (two) times daily. 11/01/19  Yes [provider]  carvedilol (COREG) 6.25 MG tablet Take 6.25 mg by mouth 2 (two) times daily with a meal.  11/14/15  Yes [provider]  cloNIDine  (CATAPRES) 0.3 MG tablet Take 0.3 mg by mouth 2 (two) times daily.    Yes [provider]  clopidogrel (PLAVIX) 75 MG tablet Take 1 tablet (75 mg total) by mouth daily. 03/02/18  Yes Dew, Erskine Squibb, MD  gabapentin (NEURONTIN) 600 MG tablet Take 600 mg by mouth 2 (two) times daily.    Yes [provider]  isosorbide mononitrate (IMDUR) 120 MG 24 hr tablet Take 120 mg by mouth daily.   Yes [provider]  levothyroxine (SYNTHROID) 25 MCG tablet Take 25 mcg by mouth daily.  10/09/18  Yes [provider]  lisinopril (ZESTRIL) 40 MG tablet Take 40 mg by mouth daily. 04/12/19  Yes [provider]  lovastatin (MEVACOR) 40 MG tablet Take 0.5 tablets (20 mg total) by mouth at bedtime. 08/12/19  Yes Revankar, Reita Cliche, MD  nitroGLYCERIN (NITROSTAT) 0.4 MG SL tablet Place 0.4 mg under the tongue every 5 (five) minutes as needed for chest pain.    Yes [provider]  vitamin B-12 (CYANOCOBALAMIN) 500 MCG tablet Take 500 mcg by mouth daily.    Yes [provider]  LEVEMIR FLEXTOUCH 100 UNIT/ML Pen Inject 10-50 Units into the skin at bedtime.  12/02/18   [provider]  metolazone (ZAROXOLYN) 2.5 MG tablet Take 2.5 mg by mouth daily. Patient not taking: Reported on 02/19/2020    [provider]   CT PELVIS WO CONTRAST  Result Date: 02/18/2020 CLINICAL DATA:  Pelvic pain, stress fracture suspected, severe right hip pain and sciatica. Groin pain radiating to the leg, unable to bear weight since Tuesday EXAM: CT PELVIS WITHOUT CONTRAST TECHNIQUE: Multidetector CT imaging of the pelvis was performed following the standard protocol without intravenous contrast. COMPARISON:  CT abdomen pelvis 10/05/2018, hip radiograph 02/18/2020 FINDINGS: Urinary Tract: Bladder distention with indentation of the bladder base by an enlarged prostate. No bladder wall thickening, visible calculi or debris. Distal ureters are unremarkable. Bowel: No abnormal bowel wall  thickening, dilatation or evidence of obstruction. Scattered colonic diverticula without focal inflammation to suggest diverticulitis. The appendix is surgically absent. Vascular/Lymphatic: Aortoiliac atherosclerosis with additional calcifications in the included proximal outflow vasculature as well. Luminal evaluation precluded in the absence of contrast media. No suspicious or enlarged lymph nodes in the included lymphatic chains. Reproductive: Mild prostatomegaly with slight indentation of the bladder base by some median lobe hypertrophy. Seminal vesicles are unremarkable. Other: Mild body wall edema. No bowel containing hernia. Small fat containing inguinal hernias. No free  pelvic air or fluid. Musculoskeletal: Redemonstration of 8 mm of grade 1 anterolisthesis L5 on S1 with associated bilateral L5 pars defects which appear well corticated, chronic and unchanged from comparison study. Slight retrolisthesis of L4 on L5 is also unchanged from comparison. Discogenic and facet degenerative changes are present at the included L4-S1 levels. Mild canal stenosis and moderate bilateral foraminal narrowing L4-L5 and severe bilateral foraminal narrowing L5-S1, right greater than left with compression of the exiting nerve root (7/89 which is similar to prior but could present is a pain generator in this patient. The sacrum is intact. No sacral insufficiency fractures are identified. Partial ankylosis along superior margins of the bilateral SI joints with mild underlying sacroiliac arthrosis unchanged from comparison. Mild angulation at the sacrococcygeal junction is chronic and unchanged from prior. There is moderate right and mild left hip osteoarthrosis including periacetabular spurring, subchondral sclerosis and cyst formation. Femoral heads however remain normally located in the proximal femora and cells are intact. These features are progressive from comparison imaging. IMPRESSION: 1. No acute fracture or traumatic  osseous injuries. No suspicious osseous lesions. 2. Unchanged 8 mm of grade 1 anterolisthesis L5 on S1 with associated bilateral L5 pars defects which appear well corticated, chronic and unchanged from comparison study. 3. Moderate right and mild left hip osteoarthrosis, progressive from comparison imaging. 4. Discogenic and facet degenerative changes at L4-L5 and L5-S1 with severe bilateral foraminal narrowing L5-S1, right greater than left, with resulting compression of the exiting nerve roots. 5. Moderate bladder distention with indentation of the bladder base by an enlarged prostate. Correlate for symptoms of outlet obstruction. 6. Diverticulosis without evidence of diverticulitis. 7. Prior appendectomy. 8. Aortic Atherosclerosis (ICD10-I70.0). Electronically Signed   By: Lovena Le M.D.   On: 02/18/2020 22:15   MR LUMBAR SPINE WO CONTRAST  Result Date: 02/19/2020 CLINICAL DATA:  Low back pain radiating to the right lower extremity EXAM: MRI LUMBAR SPINE WITHOUT CONTRAST TECHNIQUE: Multiplanar, multisequence MR imaging of the lumbar spine was performed. No intravenous contrast was administered. COMPARISON:  None. FINDINGS: Segmentation:  Standard. Alignment: Grade 1 retrolisthesis at L4-5 and grade 1 anterolisthesis at L5-S1 Vertebrae:  There are bilateral L5 pars interarticularis defects Conus medullaris and cauda equina: Conus extends to the L1 level. Conus and cauda equina appear normal. Paraspinal and other soft tissues: Markedly distended urinary bladder. Disc levels: L1-L2: Normal disc space and facet joints. There is no spinal canal stenosis. No neural foraminal stenosis. L2-L3: Normal disc space and facet joints. There is no spinal canal stenosis. No neural foraminal stenosis. L3-L4: Normal disc space and facet joints. There is no spinal canal stenosis. No neural foraminal stenosis. L4-L5: Mild facet hypertrophy with fluid in the facet joints. Small disc bulge. There is no spinal canal stenosis.  Mild bilateral neural foraminal stenosis. L5-S1: Disc space narrowing and partial uncovering. There is no spinal canal stenosis. Severe bilateral neural foraminal stenosis. Visualized sacrum: Normal. IMPRESSION: 1. Bilateral L5 pars interarticularis defects with grade 1 anterolisthesis and severe bilateral neural foraminal stenosis. 2. Mild bilateral L4-5 neural foraminal stenosis. 3. Markedly distended urinary bladder. Electronically Signed   By: Ulyses Jarred M.D.   On: 02/19/2020 00:00   MR HIP RIGHT WO CONTRAST  Result Date: 02/19/2020 CLINICAL DATA:  Right hip and groin pain. EXAM: MR OF THE RIGHT HIP WITHOUT CONTRAST TECHNIQUE: Multiplanar, multisequence MR imaging was performed. No intravenous contrast was administered. COMPARISON:  None. FINDINGS: Bones: No hip fracture, dislocation or avascular necrosis. No periosteal reaction or bone  destruction. No aggressive osseous lesion. Normal sacrum and sacroiliac joints. No SI joint widening or erosive changes. Degenerative disease with disc height loss and facet arthropathy at L4-5 and L5-S1. Articular cartilage and labrum Articular cartilage: High-grade partial-thickness cartilage loss of the right femoral head and acetabulum with subchondral marrow edema in the acetabulum. Labrum: Right labral degeneration with a tear of the superior and anterior labrum. Joint or bursal effusion Joint effusion: Small right hip joint effusion. No left hip joint effusion. No SI joint effusion. Bursae:  No bursa formation. Muscles and tendons Flexors: Normal. Extensors: Mild muscle edema in the vastus intermedius muscle proximally likely reflecting muscle strain. Remainder of the muscles are normal. Abductors: Normal. Adductors: Normal. Gluteals: Normal. Hamstrings: Normal. Other findings No pelvic free fluid. No fluid collection or hematoma. No inguinal lymphadenopathy. No inguinal hernia. Distended bladder. IMPRESSION: 1. No hip fracture, dislocation or avascular necrosis. 2.  Moderate-severe osteoarthritis of the right hip. 3. Right labral degeneration with a tear of the superior and anterior labrum. Electronically Signed   By: Kathreen Devoid   On: 02/19/2020 13:03   DG Hip Unilat W or Wo Pelvis 2-3 Views Right  Result Date: 02/18/2020 CLINICAL DATA:  Right hip and groin pain for the last 4 days. Pain with movement. No injury. EXAM: DG HIP (WITH OR WITHOUT PELVIS) 2-3V RIGHT COMPARISON:  None. FINDINGS: No fracture or bone lesion. Hip joints are normally spaced and aligned. On the right, there is bony productive change along the inferior margin of the acetabulum. SI joints and symphysis pubis are normally spaced and aligned. Arterial atherosclerotic calcifications are noted along the iliac and femoral vessels. Soft tissues are otherwise unremarkable. IMPRESSION: 1. No fracture or acute finding. 2. Mild chronic appearing bony productive changes along the inferior right acetabulum. No other degenerative/arthropathic changes. Electronically Signed   By: Lajean Manes M.D.   On: 02/18/2020 14:21    Positive ROS: All other systems have been reviewed and were otherwise negative with the exception of those mentioned in the HPI and as above.  Physical Exam: General:  Alert, no acute distress Psychiatric:  Patient is competent for consent with normal mood and affect   Cardiovascular:  No pedal edema Respiratory:  No wheezing, non-labored breathing GI:  Abdomen is soft and non-tender Skin:  No lesions in the area of chief complaint Neurologic:  Sensation intact distally Lymphatic:  No axillary or cervical lymphadenopathy  Orthopedic Exam:  Orthopedic examination is limited to the right hip and lower extremity.  The right lower extremity appears to be symmetrically aligned when compared to the left lower extremity.  Skin inspection around the right hip is unremarkable.  No swelling, erythema, ecchymosis, abrasions, or other skin abnormalities are identified.  He has mild-moderate  focal tenderness to palpation over the anterior aspect of the hip, and minimal tenderness posterior laterally.  He has more severe pain with attempted logrolling of the hip as well as with passive hip flexion beyond 70 degrees.  However, he is able to perform an active straight leg raise with mild discomfort.  With his hip flexed and 70 degrees, he can tolerate internal rotation to 10 degrees and external rotation to 20 degrees.  He again experiences pain with both with internal and external rotation.  He is neurovascularly intact to the right lower extremity and foot, demonstrating 4+/5 strength with resisted dorsiflexion, plantarflexion, inversion, and eversion.  Sensation is intact to light touch to all distributions.  He has good capillary refill to his right  foot.  X-rays:  X-rays of the pelvis and right hip are available for review and have been reviewed by myself.  These films demonstrate moderate degenerative changes as manifest by mild joint space narrowing and a small inferior femoral neck osteophyte.  No fractures or lytic lesions are identified.  A CT scan of the pelvis also is available for review and has been reviewed by myself.  This study confirms the presence of moderate degenerative changes, but shows no evidence for fractures, lytic lesions, or other significant bony or soft tissue pathology.  The CT scan does demonstrate a significantly enlarged bladder.  An MRI scan of the right hip also is available for review and has been reviewed by myself.  The study again is consistent with moderate degenerative changes with increased bone marrow edema in the superior acetabular region, but there is no joint effusion.  In addition, no fractures, lytic lesions, or other significant bony or soft tissue pathology are identified.  The MRI scan also confirms the presence of an enlarged bladder of uncertain clinical significance.  Assessment: New onset right hip pain, probably flare-up of degenerative  joint disease of right hip.  Plan: The treatment options have been discussed with the patient.  Based on his history and examination, I do feel that the majority of his symptoms are coming from his hip rather than from his back.  Furthermore, I do not see anything to suggest a vascular, neurologic, or infectious etiology to his symptoms.  Therefore, it seems as though his symptoms may be explained by a flareup of the underlying degenerative joint disease of his right hip.    He does feel that his symptoms are somewhat improved as compared to when he first presented to the emergency room.  Therefore, I feel it is reasonable to give him a trial of physical therapy, attempting to progressively mobilize him over the next 1 to 2 days as symptoms permit.  If he is able to become sufficiently mobile, then he may be discharged home and follow-up with orthopedics on an outpatient basis.  If he is unable to be mobilized successfully, then he might benefit from either an intra-articular steroid injection or possibly a total hip arthroplasty.  Thank you for asking me to participate in the care of this pleasant yet unfortunate man.  I will be happy to follow him with you.   Pascal Lux, MD  Beeper #:  [phone]  02/19/2020 4:22 PM

## 2020-02-19 NOTE — Evaluation (Signed)
Occupational Therapy Evaluation Patient Details Name: Phillip [name] MRN: [id number] DOB: Mar 16, 1956 Today's Date: 02/19/2020    History of Present Illness 64 yo male with onset of R hip pain is noted to have B L5 pars defect with B foraminal stenosis L5-S1, disc and facet degeneration L4-5, L5-S1.  Has B nerve root compression.   Has AKI on CKD, and no changes, including fracture, on hip imaging.   PMHx:  CHF, CKD 4, CAD s/p CABG, PVD, DM, HTN, HLD, anemia, hypothyroidism, diverticulosis, atherosclerosis,    Clinical Impression   Phillip [name] was seen for OT evaluation this date. Prior to hospital admission, pt was Independent in mobility and I/ADLs. Pt lives c spouse. Pt presents to acute OT demonstrating impaired ADL performance and functional mobility 2/2 decreased activity tolerance and functional balance deficits. Pt currently requires  MIN A do shorts seated EOB - assist to thread over RLE (high stretcher height). Pt would benefit from skilled OT to address noted impairments and functional limitations (see below for any additional details) in order to maximize safety and independence while minimizing falls risk and caregiver burden. Upon hospital discharge, recommend HHOT to maximize pt safety and return to functional independence during meaningful occupations of daily life.    Follow Up Recommendations  Home health OT    Equipment Recommendations  Other (comment) Librarian, academic)    Recommendations for Other Services       Precautions / Restrictions Precautions Precautions: Fall Precaution Comments: monitor O2 sats Restrictions Weight Bearing Restrictions: No      Mobility Bed Mobility Overal bed mobility: Needs Assistance   General bed mobility comments: Pt received and left sitting EOB  Transfers Overall transfer level: Needs assistance Equipment used: Rolling walker (2 wheeled) Transfers: Sit to/from Stand Sit to Stand: Min guard         General transfer  comment: CGA + RW     Balance Overall balance assessment: Needs assistance Sitting-balance support: Feet supported Sitting balance-Leahy Scale: Good     Standing balance support: Bilateral upper extremity supported;During functional activity Standing balance-Leahy Scale: Fair Standing balance comment: heavy BUE support opn RW        ADL either performed or assessed with clinical judgement   ADL Overall ADL's : Needs assistance/impaired    General ADL Comments: MIN A do shorts seated EOB - assist to thread over RLE (high stretcher height)                  Pertinent Vitals/Pain Pain Assessment: No/denies pain     Hand Dominance Right   Extremity/Trunk Assessment Upper Extremity Assessment Upper Extremity Assessment: Overall WFL for tasks assessed   Lower Extremity Assessment Lower Extremity Assessment: Overall WFL for tasks assessed       Communication Communication Communication: No difficulties   Cognition Arousal/Alertness: Awake/alert Behavior During Therapy: WFL for tasks assessed/performed Overall Cognitive Status: Within Functional Limits for tasks assessed       General Comments: Increased time for multi step commands   General Comments  SpO2 98% on RA    Exercises Exercises: Other exercises Other Exercises Other Exercises: Pt educated re: OT role, DME recs, d/c recs, falls prevention Other Exercises: LBD, sit<>stand, sitting/standing balance/tolerance   Shoulder Instructions      Home Living Family/patient expects to be discharged to:: Private residence Living Arrangements: Spouse/significant other Available Help at Discharge: Family;Available 24 hours/day Type of Home: House Home Access: Stairs to enter [employer] of Steps: 2 Entrance Stairs-Rails: Can reach both  Home Layout: One level         Bathroom Toilet: Standard     Home Equipment: None   Additional Comments: I with mobility before admit      Prior  Functioning/Environment Level of Independence: Independent        Comments: could do all his own self care and did household work incl cooking        OT Problem List: Decreased activity tolerance;Impaired balance (sitting and/or standing);Decreased safety awareness;Decreased knowledge of use of DME or AE      OT Treatment/Interventions: Self-care/ADL training;Therapeutic exercise;Energy conservation;DME and/or AE instruction;Therapeutic activities;Patient/family education;Balance training    OT Goals(Current goals can be found in the care plan section) Acute Rehab OT Goals Patient Stated Goal: to get home and not hurt OT Goal Formulation: With patient Time For Goal Achievement: 03/04/20 Potential to Achieve Goals: Good ADL Goals Pt Will Perform Grooming: with modified independence;standing (c LRAD PRN) Pt Will Perform Lower Body Dressing: with modified independence;sit to/from stand (c LRAD PRN) Pt Will Transfer to Toilet: with modified independence;ambulating;regular height toilet (c LRAD PRN)  OT Frequency: Min 1X/week   Barriers to D/C: Inaccessible home environment             AM-PAC OT "6 Clicks" Daily Activity     Outcome Measure Help from another person eating meals?: None Help from another person taking care of personal grooming?: None Help from another person toileting, which includes using toliet, bedpan, or urinal?: A Little Help from another person bathing (including washing, rinsing, drying)?: A Little Help from another person to put on and taking off regular upper body clothing?: None Help from another person to put on and taking off regular lower body clothing?: A Little 6 Click Score: 21   End of Session Equipment Utilized During Treatment: Rolling walker  Activity Tolerance: Patient tolerated treatment well Patient left: in bed;with call bell/phone within reach  OT Visit Diagnosis: Other abnormalities of gait and mobility (R26.89)                Time:  1351-1403 OT Time Calculation (min): 12 min Charges:  OT General Charges $OT Visit: 1 Visit OT Evaluation $OT Eval Low Complexity: 1 Low OT Treatments $Self Care/Home Management : 8-22 mins  Dessie Coma, M.S. OTR/L  02/19/20, 2:35 PM  ascom [phone]

## 2020-02-19 NOTE — ED Notes (Signed)
Checked on PT to see if PT needed anyting. Pt was sleeping so I did not bother them.

## 2020-02-19 NOTE — ED Notes (Signed)
Post void bladder scan result = 19ml, after pt had urinated 1150 cc over last hour and half.

## 2020-02-19 NOTE — Progress Notes (Signed)
[facility]  ROUNDING NOTE   Subjective:   [name] admitted to [facility] on 02/18/2020 for Acute kidney injury superimposed on CKD ([place]) [N17.9, N18.9]  Patient presented with right hip pain. Patient was not found to have a fracture.   Creatinine 3.55 on admission from baseline creatinine of 2.42.   Objective:  Vital signs in last 24 hours:  Temp:  [98.6 F (37 C)-98.9 F (37.2 C)] 98.6 F (37 C) (09/18 0956) Pulse Rate:  [74-84] 84 (09/18 0956) Resp:  [14-20] 16 (09/18 0956) BP: (132-175)/(67-86) 164/86 (09/18 0956) SpO2:  [96 %-100 %] 98 % (09/18 0956) Weight:  [99.3 kg] 99.3 kg (09/17 1331)  Weight change:  Filed Weights   02/18/20 1331  Weight: 99.3 kg    Intake/Output: I/O last 3 completed shifts: In: 503 [I.V.:3; IV Piggyback:500] Out: 800 [Urine:800]   Intake/Output this shift:  Total I/O In: -  Out: 450 [Urine:450]  Physical Exam: General: NAD, laying in stretcher  Head: Normocephalic, atraumatic. Moist oral mucosal membranes  Eyes: Anicteric, PERRL  Neck: Supple, trachea midline  Lungs:  Clear to auscultation  Heart: Regular rate and rhythm  Abdomen:  Soft, nontender,   Extremities: no peripheral edema.  Neurologic: Nonfocal, moving all four extremities  Skin: No lesions  Access: none    Basic Metabolic Panel: Recent Labs  Lab 02/18/20 1346 02/19/20 0420  NA 139 139  K 3.7 3.4*  CL 100 101  CO2 26 27  GLUCOSE 272* 327*  BUN 91* 88*  CREATININE 3.55* 3.20*  CALCIUM 8.7* 8.7*    Liver Function Tests: Recent Labs  Lab 02/18/20 1346  AST 16  ALT 12  ALKPHOS 77  BILITOT 0.8  PROT 8.3*  ALBUMIN 3.9   No results for input(s): LIPASE, AMYLASE in the last 168 hours. No results for input(s): AMMONIA in the last 168 hours.  CBC: Recent Labs  Lab 02/18/20 1346 02/19/20 0420  WBC 7.3 7.1  HGB 12.3* 10.7*  HCT 36.2* 32.6*  MCV 86.2 88.3  PLT 154 139*    Cardiac Enzymes: No results for input(s): CKTOTAL,  CKMB, CKMBINDEX, TROPONINI in the last 168 hours.  BNP: Invalid input(s): POCBNP  CBG: Recent Labs  Lab 02/19/20 0921  GLUCAP 240*    Microbiology: Results for orders placed or performed during the hospital encounter of 02/18/20  SARS Coronavirus 2 by RT PCR (hospital order, performed in [facility] hospital lab) Nasopharyngeal Nasopharyngeal Swab     Status: None   Collection Time: 02/18/20 10:38 PM   Specimen: Nasopharyngeal Swab  Result Value Ref Range Status   SARS Coronavirus 2 NEGATIVE NEGATIVE Final    Comment: (NOTE) SARS-CoV-2 target nucleic acids are NOT DETECTED.  The SARS-CoV-2 RNA is generally detectable in upper and lower respiratory specimens during the acute phase of infection. The lowest concentration of SARS-CoV-2 viral copies this assay can detect is 250 copies / mL. A negative result does not preclude SARS-CoV-2 infection and should not be used as the sole basis for treatment or other patient management decisions.  A negative result may occur with improper specimen collection / handling, submission of specimen other than nasopharyngeal swab, presence of viral mutation(s) within the areas targeted by this assay, and inadequate number of viral copies (<250 copies / mL). A negative result must be combined with clinical observations, patient history, and epidemiological information.  Fact Sheet for Patients:   StrictlyIdeas.no  Fact Sheet for Healthcare Providers: BankingDealers.co.za  This test is not yet  approved or  cleared by the [place] and has been authorized for detection and/or diagnosis of SARS-CoV-2 by FDA under an Emergency Use Authorization (EUA).  This EUA will remain in effect (meaning this test can be used) for the duration of the COVID-19 declaration under Section 564(b)(1) of the Act, 21 U.S.C. section 360bbb-3(b)(1), unless the authorization is terminated or revoked  sooner.  Performed at [facility], [address] [place], [place] [address]     Coagulation Studies: No results for input(s): LABPROT, INR in the last 72 hours.  Urinalysis: Recent Labs    02/19/20 0130  COLORURINE YELLOW*  LABSPEC 1.012  PHURINE 5.0  GLUCOSEU 50*  HGBUR SMALL*  BILIRUBINUR NEGATIVE  KETONESUR NEGATIVE  PROTEINUR 100*  NITRITE NEGATIVE  LEUKOCYTESUR NEGATIVE      Imaging: CT PELVIS WO CONTRAST  Result Date: 02/18/2020 CLINICAL DATA:  Pelvic pain, stress fracture suspected, severe right hip pain and sciatica. Groin pain radiating to the leg, unable to bear weight since Tuesday EXAM: CT PELVIS WITHOUT CONTRAST TECHNIQUE: Multidetector CT imaging of the pelvis was performed following the standard protocol without intravenous contrast. COMPARISON:  CT abdomen pelvis 10/05/2018, hip radiograph 02/18/2020 FINDINGS: Urinary Tract: Bladder distention with indentation of the bladder base by an enlarged prostate. No bladder wall thickening, visible calculi or debris. Distal ureters are unremarkable. Bowel: No abnormal bowel wall thickening, dilatation or evidence of obstruction. Scattered colonic diverticula without focal inflammation to suggest diverticulitis. The appendix is surgically absent. Vascular/Lymphatic: Aortoiliac atherosclerosis with additional calcifications in the included proximal outflow vasculature as well. Luminal evaluation precluded in the absence of contrast media. No suspicious or enlarged lymph nodes in the included lymphatic chains. Reproductive: Mild prostatomegaly with slight indentation of the bladder base by some median lobe hypertrophy. Seminal vesicles are unremarkable. Other: Mild body wall edema. No bowel containing hernia. Small fat containing inguinal hernias. No free pelvic air or fluid. Musculoskeletal: Redemonstration of 8 mm of grade 1 anterolisthesis L5 on S1 with associated bilateral L5 pars defects which appear well  corticated, chronic and unchanged from comparison study. Slight retrolisthesis of L4 on L5 is also unchanged from comparison. Discogenic and facet degenerative changes are present at the included L4-S1 levels. Mild canal stenosis and moderate bilateral foraminal narrowing L4-L5 and severe bilateral foraminal narrowing L5-S1, right greater than left with compression of the exiting nerve root (7/89 which is similar to prior but could present is a pain generator in this patient. The sacrum is intact. No sacral insufficiency fractures are identified. Partial ankylosis along superior margins of the bilateral SI joints with mild underlying sacroiliac arthrosis unchanged from comparison. Mild angulation at the sacrococcygeal junction is chronic and unchanged from prior. There is moderate right and mild left hip osteoarthrosis including periacetabular spurring, subchondral sclerosis and cyst formation. Femoral heads however remain normally located in the proximal femora and cells are intact. These features are progressive from comparison imaging. IMPRESSION: 1. No acute fracture or traumatic osseous injuries. No suspicious osseous lesions. 2. Unchanged 8 mm of grade 1 anterolisthesis L5 on S1 with associated bilateral L5 pars defects which appear well corticated, chronic and unchanged from comparison study. 3. Moderate right and mild left hip osteoarthrosis, progressive from comparison imaging. 4. Discogenic and facet degenerative changes at L4-L5 and L5-S1 with severe bilateral foraminal narrowing L5-S1, right greater than left, with resulting compression of the exiting nerve roots. 5. Moderate bladder distention with indentation of the bladder base by an enlarged prostate. Correlate for symptoms of  outlet obstruction. 6. Diverticulosis without evidence of diverticulitis. 7. Prior appendectomy. 8. Aortic Atherosclerosis (ICD10-I70.0). Electronically Signed   By: Lovena Le M.D.   On: 02/18/2020 22:15   MR LUMBAR SPINE  WO CONTRAST  Result Date: 02/19/2020 CLINICAL DATA:  Low back pain radiating to the right lower extremity EXAM: MRI LUMBAR SPINE WITHOUT CONTRAST TECHNIQUE: Multiplanar, multisequence MR imaging of the lumbar spine was performed. No intravenous contrast was administered. COMPARISON:  None. FINDINGS: Segmentation:  Standard. Alignment: Grade 1 retrolisthesis at L4-5 and grade 1 anterolisthesis at L5-S1 Vertebrae:  There are bilateral L5 pars interarticularis defects Conus medullaris and cauda equina: Conus extends to the L1 level. Conus and cauda equina appear normal. Paraspinal and other soft tissues: Markedly distended urinary bladder. Disc levels: L1-L2: Normal disc space and facet joints. There is no spinal canal stenosis. No neural foraminal stenosis. L2-L3: Normal disc space and facet joints. There is no spinal canal stenosis. No neural foraminal stenosis. L3-L4: Normal disc space and facet joints. There is no spinal canal stenosis. No neural foraminal stenosis. L4-L5: Mild facet hypertrophy with fluid in the facet joints. Small disc bulge. There is no spinal canal stenosis. Mild bilateral neural foraminal stenosis. L5-S1: Disc space narrowing and partial uncovering. There is no spinal canal stenosis. Severe bilateral neural foraminal stenosis. Visualized sacrum: Normal. IMPRESSION: 1. Bilateral L5 pars interarticularis defects with grade 1 anterolisthesis and severe bilateral neural foraminal stenosis. 2. Mild bilateral L4-5 neural foraminal stenosis. 3. Markedly distended urinary bladder. Electronically Signed   By: Ulyses Jarred M.D.   On: 02/19/2020 00:00   DG Hip Unilat W or Wo Pelvis 2-3 Views Right  Result Date: 02/18/2020 CLINICAL DATA:  Right hip and groin pain for the last 4 days. Pain with movement. No injury. EXAM: DG HIP (WITH OR WITHOUT PELVIS) 2-3V RIGHT COMPARISON:  None. FINDINGS: No fracture or bone lesion. Hip joints are normally spaced and aligned. On the right, there is bony  productive change along the inferior margin of the acetabulum. SI joints and symphysis pubis are normally spaced and aligned. Arterial atherosclerotic calcifications are noted along the iliac and femoral vessels. Soft tissues are otherwise unremarkable. IMPRESSION: 1. No fracture or acute finding. 2. Mild chronic appearing bony productive changes along the inferior right acetabulum. No other degenerative/arthropathic changes. Electronically Signed   By: Lajean Manes M.D.   On: 02/18/2020 14:21     Medications:    . aspirin EC  81 mg Oral Daily  . carvedilol  6.25 mg Oral BID WC  . cloNIDine  0.3 mg Oral BID  . clopidogrel  75 mg Oral Daily  . gabapentin  300 mg Oral BID  . heparin  5,000 Units Subcutaneous Q8H  . insulin aspart  0-6 Units Subcutaneous TID WC  . insulin detemir  10 Units Subcutaneous Daily  . isosorbide mononitrate  120 mg Oral Daily  . levothyroxine  25 mcg Oral Daily  . pravastatin  20 mg Oral q1800  . sodium chloride flush  3 mL Intravenous Q12H   acetaminophen **OR** acetaminophen, ondansetron **OR** ondansetron (ZOFRAN) IV  Assessment/ Plan:  Mr. KIONDRE GRENZ is a 64 y.o. black male with diabetes mellitus type II, diabetic neuropathy, hypertension, coronary artery disease status post CABG, peripheral vascular disease, status post left toe amputation, diastolic congestive heart failure, COPD, who was admitted to [facility] on 02/18/2020 for Acute kidney injury superimposed on CKD ([place]) [N17.9, N18.9]  1. Acute renal failure on chronic kidney disease stage IV with proteinuria:  baseline creatinine of 2.42, GFR of 27 on 10/22/19 Chronic kidney disease secondary to diabetes and hypertension No exposure to IV contrast - improving with IV fluids - no acute indication for dialysis - encourage PO intake.  - holding bumetanide, lisinopril and metolazone - check renal ultrasound - place foley catheter.   2. Hypertension: 140/76 - history of difficult to control. Home regimen  of carvedilol, clonidine, lisinopril, bumetanide, and metolazone. Holding lisinopril, metolazone and bumetanide - Continue clonidine and carvedilol  3. Diabetes mellitus type II with chronic kidney disease: insulin dependent.  - check hemoglobin A1c  4. Anemia with renal failure: hemoglobin trending down. 10.7. normocytic.    LOS: 0 Axiel Fjeld 9/18/20211:02 PM

## 2020-02-19 NOTE — Progress Notes (Signed)
PROGRESS NOTE    Patient: Phillip [name]                            PCP: Raina Mina., MD                    DOB: Dec 17, 1955            DOA: 02/18/2020 [id number]             DOS: 02/19/2020, 7:17 AM   LOS: 0 days   Date of Service: The patient was seen and examined on 02/19/2020  Subjective:   The patient was seen and examined this Am. Remained stable, still complaining of hip pain, not ambulated yet. Stating her nephrologist at [facility]... Aware of his worsening kidney function.  Brief Narrative:   HPI: Phillip [name] is a 64 y.o. male w PMH/o hronic benign systolic and diastolic CHF (EF 32-67%, G3 DD by TTE 05/12/2019), CKD stage IV, CAD s/p CABG, PVD, IDT2DM, HTN, HLD,HPT,  anemia of chronic disease, and hypothyroidism who is admitted with AKI on CKD stage IV, and  right groin/hip pain.  ED Course:  Initial vitals showed BP 173/77, pulse 77, RR 19, temp 98.8 Fahrenheit, SPO2 99% on room air.  Labs show BUN 91, creatinine 3.55 (last creatinine 2.77 on 12/30/2019), GFR 20 (previously 27), sodium 139, potassium 3.7, bicarb 26, LFTs within normal limits, serum glucose 272, WBC 7.3, hemoglobin 12.3, platelets 144,000.  SARS-CoV-2 PCR is obtained and pending.  Right hip x-ray was negative for fracture or acute finding.  Mild chronic appearing bony productive changes along the inferior right acetabulum were seen.  CT pelvis without contrast was negative for acute fracture or traumatic osseous injuries or suspicious osseous lesions.  Unchanged grade 1 anterolisthesis L5 on S1 with associated bilateral L5 pars defects was seen and unchanged from prior.  Moderate right and mild left hip osteoarthritis were seen, progressive from prior imaging.  Discogenic and facet degenerative changes at L4-L5 and L5-S1 with severe bilateral foraminal narrowing L5-S1 were seen, right greater than left, with compression on existing nerve roots.  Moderate bladder distention, diverticulosis  without evidence of diverticulitis, prior appendectomy, and aortic atherosclerosis are also noted.  Patient was given 500 cc LR, 500 mg oral Robaxin, and 1000 mg Tylenol.  The hospitalist service was consulted to admit for further evaluation and management.   Assessment & Plan:   Principal Problem:   Acute kidney injury superimposed on CKD ([place]) Active Problems:   CAD (coronary artery disease)   Hypothyroidism   Chronic combined systolic and diastolic CHF (congestive heart failure) (HCC)   Anemia of chronic disease   Hypertension associated with diabetes ([place])   Type 2 diabetes mellitus with stage 4 chronic kidney disease, with long-term current use of insulin (HCC)   Hyperlipidemia associated with type 2 diabetes mellitus (HCC)   Right groin pain    AKI on CKD stage IV: - Cr.  3.55, 3.2 this a.m. (baseline creatinine around 2) -Suspect prerenal as patient appears volume depleted on admission in setting of home diuretics. -Hold home Bumex, lisinopril -Urine sodium 99, creatinine 70, -Start gentle IV fluid hydration with NS@100  mL/hour overnight -Monitor strict I/O's and daily weights -Nephrology consulted, appreciate input and recommendation  Right groin/hip pain with associated weakness and difficulty ambulating: -Still complaining of hip pain -MRI of the hip lumbar spine-reviewed-review of revealing  Bilateral L5 pars interarticularis defects with grade  1.  anterolisthesis and severe bilateral neural foraminal stenosis CT pelvis without evidence of fracture, traumatic osseous injuries, or suspicious osseous lesions.  Severe bilateral foraminal narrowing L5-S1 seen on CT imaging.  -Continue Tylenol as needed for pain -Continue home gabapentin at reduced dose due to renal dysfunction -Request PT/OT eval  Chronic combined systolic and diastolic CHF: -Monitoring very closely EF 30-35%, G3 DD by TTE 05/12/2019.  Appears volume depleted on admission as above. BNP  406.2 -Holding home Bumex and lisinopril -Continue carvedilol and Imdur -Continue gentle IV fluid hydration overnight -Strict I/O's and daily weights  Insulin-dependent type 2 diabetes: -Checking his blood sugar QA CHS, with SSI coverage Hyperglycemic on admission.   Continue home Levemir 10 units nightly and add very sensitive SSI while in hospital.   -A1c.  CAD s/p CABG/ PVD: Chronic and stable without chest pain.  Continue aspirin, Plavix, Coreg Imdur, statin.  Hypertension: Continue home Coreg, clonidine, Imdur.  Holding lisinopril in setting of AKI.  Hyperlipidemia: Continue statin.  Anemia of chronic renal disease: Chronic and stable.  Continue monitor.  Hypothyroidism: Continue Synthroid.    DVT prophylaxis: Subcutaneous heparin Code Status: Full code, confirmed with patient Family Communication: Discussed with patient, he has discussed with family Disposition Plan: From home, dispo pending PT/OT eval Consults called: None Admission status:     Status is: Observation  The patient remains OBS appropriate and will d/c before 2 midnights.  Dispo: The patient is from: Home  Anticipated d/c is to: Home vs SNF pending PT/OT eval  Anticipated d/c date is: 1 day  Patient currently is not medically stable to d/c.       Consultants: Neohro. Orhto    --------------------------------------------------------------------------------------------------------------------------------------------     Procedures:   No admission procedures for hospital encounter.     Antimicrobials:  Anti-infectives (From admission, onward)   None       Medication:  . aspirin EC  81 mg Oral Daily  . carvedilol  6.25 mg Oral BID WC  . cloNIDine  0.3 mg Oral BID  . clopidogrel  75 mg Oral Daily  . gabapentin  300 mg Oral BID  . heparin  5,000 Units Subcutaneous Q8H  . insulin aspart  0-6 Units Subcutaneous TID WC  .  insulin detemir  10 Units Subcutaneous Daily  . isosorbide mononitrate  120 mg Oral Daily  . levothyroxine  25 mcg Oral Daily  . pravastatin  20 mg Oral q1800  . sodium chloride flush  3 mL Intravenous Q12H    acetaminophen **OR** acetaminophen, ondansetron **OR** ondansetron (ZOFRAN) IV   Objective:   Vitals:   02/18/20 2104 02/18/20 2105 02/19/20 0144 02/19/20 0636  BP:  (!) 175/85 132/67 (!) 142/79  Pulse:  79 83 74  Resp:  14 15 17   Temp:      TempSrc:      SpO2: 100% 100% 96% 97%  Weight:      Height:        Intake/Output Summary (Last 24 hours) at 02/19/2020 0717 Last data filed at 02/19/2020 0156 Gross per 24 hour  Intake 503 ml  Output 800 ml  Net -297 ml   Filed Weights   02/18/20 1331  Weight: 99.3 kg     Examination:   Physical Exam  Constitution:  Alert, cooperative, no distress,  Appears calm and comfortable  Psychiatric: Normal and stable mood and affect, cognition intact,   HEENT: Normocephalic, PERRL, otherwise with in Normal limits  Chest:Chest symmetric Cardio vascular:  S1/S2, RRR,  No murmure, No Rubs or Gallops  pulmonary: Clear to auscultation bilaterally, respirations unlabored, negative wheezes / crackles Abdomen: Soft, non-tender, non-distended, bowel sounds,no masses, no organomegaly Muscular skeletal:  Right hip pain, limited range of motion.  Marland Kitchen.Limited exam - in bed, able to move all 4 extremities, Normal strength,  Neuro: CNII-XII intact. , normal motor and sensation, reflexes intact  Extremities: No pitting edema lower extremities, +2 pulses  Skin: Dry, warm to touch, negative for any Rashes, No open wounds Wounds: per nursing documentation    ------------------------------------------------------------------------------------------------------------------------------------------    LABs:  CBC Latest Ref Rng & Units 02/19/2020 02/18/2020 08/10/2019  WBC 4.0 - 10.5 K/uL 7.1 7.3 5.2  Hemoglobin 13.0 - 17.0 g/dL 10.7(L) 12.3(L) 11.6(L)   Hematocrit 39 - 52 % 32.6(L) 36.2(L) 37.1(L)  Platelets 150 - 400 K/uL 139(L) 154 143(L)   CMP Latest Ref Rng & Units 02/19/2020 02/18/2020 10/22/2019  Glucose 70 - 99 mg/dL 327(H) 272(H) 165(H)  BUN 8 - 23 mg/dL 88(H) 91(H) 52(H)  Creatinine 0.61 - 1.24 mg/dL 3.20(H) 3.55(H) 2.42(H)  Sodium 135 - 145 mmol/L 139 139 138  Potassium 3.5 - 5.1 mmol/L 3.4(L) 3.7 4.2  Chloride 98 - 111 mmol/L 101 100 107(H)  CO2 22 - 32 mmol/L 27 26 21   Calcium 8.9 - 10.3 mg/dL 8.7(L) 8.7(L) 8.7  Total Protein 6.5 - 8.1 g/dL - 8.3(H) -  Total Bilirubin 0.3 - 1.2 mg/dL - 0.8 -  Alkaline Phos 38 - 126 U/L - 77 -  AST 15 - 41 U/L - 16 -  ALT 0 - 44 U/L - 12 -       Micro Results Recent Results (from the past 240 hour(s))  SARS Coronavirus 2 by RT PCR (hospital order, performed in [facility] hospital lab) Nasopharyngeal Nasopharyngeal Swab     Status: None   Collection Time: 02/18/20 10:38 PM   Specimen: Nasopharyngeal Swab  Result Value Ref Range Status   SARS Coronavirus 2 NEGATIVE NEGATIVE Final    Comment: (NOTE) SARS-CoV-2 target nucleic acids are NOT DETECTED.  The SARS-CoV-2 RNA is generally detectable in upper and lower respiratory specimens during the acute phase of infection. The lowest concentration of SARS-CoV-2 viral copies this assay can detect is 250 copies / mL. A negative result does not preclude SARS-CoV-2 infection and should not be used as the sole basis for treatment or other patient management decisions.  A negative result may occur with improper specimen collection / handling, submission of specimen other than nasopharyngeal swab, presence of viral mutation(s) within the areas targeted by this assay, and inadequate number of viral copies (<250 copies / mL). A negative result must be combined with clinical observations, patient history, and epidemiological information.  Fact Sheet for Patients:   StrictlyIdeas.no  Fact Sheet for Healthcare  Providers: BankingDealers.co.za  This test is not yet approved or  cleared by the [place] FDA and has been authorized for detection and/or diagnosis of SARS-CoV-2 by FDA under an Emergency Use Authorization (EUA).  This EUA will remain in effect (meaning this test can be used) for the duration of the COVID-19 declaration under Section 564(b)(1) of the Act, 21 U.S.C. section 360bbb-3(b)(1), unless the authorization is terminated or revoked sooner.  Performed at [facility], [address] [place], [place] [address]     Radiology Reports CT PELVIS WO CONTRAST  Result Date: 02/18/2020 CLINICAL DATA:  Pelvic pain, stress fracture suspected, severe right hip pain and sciatica. Groin pain radiating to the leg, unable to  bear weight since Tuesday EXAM: CT PELVIS WITHOUT CONTRAST TECHNIQUE: Multidetector CT imaging of the pelvis was performed following the standard protocol without intravenous contrast. COMPARISON:  CT abdomen pelvis 10/05/2018, hip radiograph 02/18/2020 FINDINGS: Urinary Tract: Bladder distention with indentation of the bladder base by an enlarged prostate. No bladder wall thickening, visible calculi or debris. Distal ureters are unremarkable. Bowel: No abnormal bowel wall thickening, dilatation or evidence of obstruction. Scattered colonic diverticula without focal inflammation to suggest diverticulitis. The appendix is surgically absent. Vascular/Lymphatic: Aortoiliac atherosclerosis with additional calcifications in the included proximal outflow vasculature as well. Luminal evaluation precluded in the absence of contrast media. No suspicious or enlarged lymph nodes in the included lymphatic chains. Reproductive: Mild prostatomegaly with slight indentation of the bladder base by some median lobe hypertrophy. Seminal vesicles are unremarkable. Other: Mild body wall edema. No bowel containing hernia. Small fat containing inguinal hernias. No free  pelvic air or fluid. Musculoskeletal: Redemonstration of 8 mm of grade 1 anterolisthesis L5 on S1 with associated bilateral L5 pars defects which appear well corticated, chronic and unchanged from comparison study. Slight retrolisthesis of L4 on L5 is also unchanged from comparison. Discogenic and facet degenerative changes are present at the included L4-S1 levels. Mild canal stenosis and moderate bilateral foraminal narrowing L4-L5 and severe bilateral foraminal narrowing L5-S1, right greater than left with compression of the exiting nerve root (7/89 which is similar to prior but could present is a pain generator in this patient. The sacrum is intact. No sacral insufficiency fractures are identified. Partial ankylosis along superior margins of the bilateral SI joints with mild underlying sacroiliac arthrosis unchanged from comparison. Mild angulation at the sacrococcygeal junction is chronic and unchanged from prior. There is moderate right and mild left hip osteoarthrosis including periacetabular spurring, subchondral sclerosis and cyst formation. Femoral heads however remain normally located in the proximal femora and cells are intact. These features are progressive from comparison imaging. IMPRESSION: 1. No acute fracture or traumatic osseous injuries. No suspicious osseous lesions. 2. Unchanged 8 mm of grade 1 anterolisthesis L5 on S1 with associated bilateral L5 pars defects which appear well corticated, chronic and unchanged from comparison study. 3. Moderate right and mild left hip osteoarthrosis, progressive from comparison imaging. 4. Discogenic and facet degenerative changes at L4-L5 and L5-S1 with severe bilateral foraminal narrowing L5-S1, right greater than left, with resulting compression of the exiting nerve roots. 5. Moderate bladder distention with indentation of the bladder base by an enlarged prostate. Correlate for symptoms of outlet obstruction. 6. Diverticulosis without evidence of  diverticulitis. 7. Prior appendectomy. 8. Aortic Atherosclerosis (ICD10-I70.0). Electronically Signed   By: Lovena Le M.D.   On: 02/18/2020 22:15   MR LUMBAR SPINE WO CONTRAST  Result Date: 02/19/2020 CLINICAL DATA:  Low back pain radiating to the right lower extremity EXAM: MRI LUMBAR SPINE WITHOUT CONTRAST TECHNIQUE: Multiplanar, multisequence MR imaging of the lumbar spine was performed. No intravenous contrast was administered. COMPARISON:  None. FINDINGS: Segmentation:  Standard. Alignment: Grade 1 retrolisthesis at L4-5 and grade 1 anterolisthesis at L5-S1 Vertebrae:  There are bilateral L5 pars interarticularis defects Conus medullaris and cauda equina: Conus extends to the L1 level. Conus and cauda equina appear normal. Paraspinal and other soft tissues: Markedly distended urinary bladder. Disc levels: L1-L2: Normal disc space and facet joints. There is no spinal canal stenosis. No neural foraminal stenosis. L2-L3: Normal disc space and facet joints. There is no spinal canal stenosis. No neural foraminal stenosis. L3-L4: Normal disc space and facet  joints. There is no spinal canal stenosis. No neural foraminal stenosis. L4-L5: Mild facet hypertrophy with fluid in the facet joints. Small disc bulge. There is no spinal canal stenosis. Mild bilateral neural foraminal stenosis. L5-S1: Disc space narrowing and partial uncovering. There is no spinal canal stenosis. Severe bilateral neural foraminal stenosis. Visualized sacrum: Normal. IMPRESSION: 1. Bilateral L5 pars interarticularis defects with grade 1 anterolisthesis and severe bilateral neural foraminal stenosis. 2. Mild bilateral L4-5 neural foraminal stenosis. 3. Markedly distended urinary bladder. Electronically Signed   By: Ulyses Jarred M.D.   On: 02/19/2020 00:00   DG Hip Unilat W or Wo Pelvis 2-3 Views Right  Result Date: 02/18/2020 CLINICAL DATA:  Right hip and groin pain for the last 4 days. Pain with movement. No injury. EXAM: DG HIP (WITH  OR WITHOUT PELVIS) 2-3V RIGHT COMPARISON:  None. FINDINGS: No fracture or bone lesion. Hip joints are normally spaced and aligned. On the right, there is bony productive change along the inferior margin of the acetabulum. SI joints and symphysis pubis are normally spaced and aligned. Arterial atherosclerotic calcifications are noted along the iliac and femoral vessels. Soft tissues are otherwise unremarkable. IMPRESSION: 1. No fracture or acute finding. 2. Mild chronic appearing bony productive changes along the inferior right acetabulum. No other degenerative/arthropathic changes. Electronically Signed   By: Lajean Manes M.D.   On: 02/18/2020 14:21    SIGNED: Deatra James, MD, FACP, FHM. [facility],  Pager (please use [email or web address] to page/text)  If 7PM-7AM, please contact night-coverage [email or web address] Ota [facility] 02/19/2020, 7:17 AM

## 2020-02-19 NOTE — Evaluation (Signed)
Physical Therapy Evaluation Patient Details Name: Phillip [name] MRN: [id number] DOB: [birth year] Today's Date: 02/19/2020   History of Present Illness  64 yo male with onset of R hip pain is noted to have B L5 pars defect with B foraminal stenosis L5-S1, disc and facet degeneration L4-5, L5-S1.  Has B nerve root compression.   Has AKI on CKD, and no changes, including fracture, on hip imaging.   PMHx:  CHF, CKD 4, CAD s/p CABG, PVD, DM, HTN, HLD, anemia, hypothyroidism, diverticulosis, atherosclerosis,   Clinical Impression  Pt was able to be assisted to move with no pain complaints, with no drop in O2 sats on room air and with good maneuvering skills on RW.  However does have LE weakness and requires help with bed mob.  Follow acutely for strengthening, to work on use of good body mechanics and for endurance skills of gait and standing statically.  Pt is motivated and will plan initially to use Home therapy to follow for his care at [place].      Follow Up Recommendations Home health PT    Equipment Recommendations  Rolling walker with 5" wheels    Recommendations for Other Services       Precautions / Restrictions Precautions Precautions: Fall Precaution Comments: monitor O2 sats Restrictions Weight Bearing Restrictions: No      Mobility  Bed Mobility Overal bed mobility: Needs Assistance Bed Mobility: Supine to Sit;Sit to Supine     Supine to sit: Mod assist Sit to supine: Min assist   General bed mobility comments: mod to lift trunk to side of bed and then min mainly for legs  Transfers Overall transfer level: Needs assistance Equipment used: Rolling walker (2 wheeled);1 person hand held assist Transfers: Sit to/from Stand Sit to Stand: Min guard         General transfer comment: min guard from higher perch  Ambulation/Gait Ambulation/Gait assistance: Min guard Gait Distance (Feet): 20 Feet Assistive device: Rolling walker (2 wheeled);1 person hand held  assist Gait Pattern/deviations: Step-through pattern;Decreased stride length;Wide base of support Gait velocity: reduced Gait velocity interpretation: <1.31 ft/sec, indicative of household ambulator General Gait Details: pt maneuvering on walker with no assist to [employer] Mobility    Modified Rankin (Stroke Patients Only)       Balance Overall balance assessment: Needs assistance Sitting-balance support: Feet supported Sitting balance-Leahy Scale: Good     Standing balance support: Bilateral upper extremity supported;During functional activity Standing balance-Leahy Scale: Fair Standing balance comment: walker to support dynamic balance tasks and manage possibility of pain                             Pertinent Vitals/Pain Pain Assessment: No/denies pain    Home Living Family/patient expects to be discharged to:: Private residence Living Arrangements: Spouse/significant other Available Help at Discharge: Family;Available 24 hours/day Type of Home: House Home Access: Stairs to enter Entrance Stairs-Rails: Can reach both Entrance Stairs-Number of Steps: 2 Home Layout: One level Home Equipment: None Additional Comments: I with mobility before admit    Prior Function Level of Independence: Independent         Comments: could do all his own self care and did household work incl cooking     Journalist, newspaper   Dominant Hand: Right    Extremity/Trunk Assessment   Upper Extremity Assessment Upper Extremity Assessment: Overall [facility]  for tasks assessed    Lower Extremity Assessment Lower Extremity Assessment: RLE deficits/detail;LLE deficits/detail RLE Deficits / Details: 4+ hip and knee and ankle 4+ to 5 LLE Deficits / Details: 4+ B hip knee and ankle x hip flex 3-    Cervical / Trunk Assessment Cervical / Trunk Assessment: Kyphotic;Other exceptions (B L5 pars defect with B foraminal stenosis L5-S1, DDD, DJD) Cervical /  Trunk Exceptions: B L5 pars defect with B foraminal stenosis L5-S1, disc and facet degeneration L4-5, L5-S1.  Communication   Communication: No difficulties  Cognition Arousal/Alertness: Awake/alert Behavior During Therapy: WFL for tasks assessed/performed Overall Cognitive Status: Within Functional Limits for tasks assessed                                 General Comments: seems a bit groggy from meds likely      General Comments General comments (skin integrity, edema, etc.): Pt is up to walk with RW, no pain at rest or in standing but demonstrates LE weakness.      Exercises     Assessment/Plan    PT Assessment Patient needs continued PT services  PT Problem List Decreased strength;Decreased range of motion;Decreased activity tolerance;Decreased balance;Decreased mobility;Cardiopulmonary status limiting activity;Pain       PT Treatment Interventions DME instruction;Gait training;Stair training;Functional mobility training;Therapeutic activities;Therapeutic exercise;Balance training;Neuromuscular re-education;Patient/family education    PT Goals (Current goals can be found in the Care Plan section)  Acute Rehab PT Goals Patient Stated Goal: to get home and not hurt PT Goal Formulation: With patient Time For Goal Achievement: 02/26/20 Potential to Achieve Goals: Good    Frequency Min 2X/week   Barriers to discharge Inaccessible home environment has stairs to enter house    Co-evaluation               AM-PAC PT "6 Clicks" Mobility  Outcome Measure Help needed turning from your back to your side while in a flat bed without using bedrails?: None Help needed moving from lying on your back to sitting on the side of a flat bed without using bedrails?: A Lot Help needed moving to and from a bed to a chair (including a wheelchair)?: A Little Help needed standing up from a chair using your arms (e.g., wheelchair or bedside chair)?: A Little Help needed to  walk in hospital room?: A Little Help needed climbing 3-5 steps with a railing? : A Little 6 Click Score: 18    End of Session Equipment Utilized During Treatment: Gait belt Activity Tolerance: Treatment limited secondary to medical complications (Comment) Patient left: in bed;with call bell/phone within reach Nurse Communication: Mobility status PT Visit Diagnosis: Muscle weakness (generalized) (M62.81);Pain Pain - Right/Left: Right Pain - part of body: Hip    Time: 1740-8144 PT Time Calculation (min) (ACUTE ONLY): 23 min   Charges:   PT Evaluation $PT Eval Moderate Complexity: 1 Mod PT Treatments $Gait Training: 8-22 mins       Ramond Dial 02/19/2020, 1:43 PM  Mee Hives, PT MS Acute Rehab Dept. Number: [place] and [place]

## 2020-02-19 NOTE — ED Notes (Signed)
Report to megan, rn.  

## 2020-02-19 NOTE — Progress Notes (Signed)
Attempted to call for report on patient, nurse was busy and will call me back with report when available,

## 2020-02-19 NOTE — ED Notes (Signed)
Pt attempting to urinate with urinal. Privacy screens placed around pt.

## 2020-02-20 LAB — GLUCOSE, CAPILLARY
Glucose-Capillary: 156 mg/dL — ABNORMAL HIGH (ref 70–99)
Glucose-Capillary: 195 mg/dL — ABNORMAL HIGH (ref 70–99)
Glucose-Capillary: 265 mg/dL — ABNORMAL HIGH (ref 70–99)
Glucose-Capillary: 234 mg/dL — ABNORMAL HIGH (ref 70–99)

## 2020-02-20 LAB — BASIC METABOLIC PANEL
Anion gap: 9 (ref 5–15)
BUN: 83 mg/dL — ABNORMAL HIGH (ref 8–23)
CO2: 27 mmol/L (ref 22–32)
Calcium: 8.6 mg/dL — ABNORMAL LOW (ref 8.9–10.3)
Chloride: 103 mmol/L (ref 98–111)
Creatinine, Ser: 2.72 mg/dL — ABNORMAL HIGH (ref 0.61–1.24)
GFR calc Af Amer: 27 mL/min — ABNORMAL LOW (ref >60)
GFR calc non Af Amer: 24 mL/min — ABNORMAL LOW (ref >60)
Glucose, Bld: 188 mg/dL — ABNORMAL HIGH (ref 70–99)
Potassium: 3.5 mmol/L (ref 3.5–5.1)
Sodium: 139 mmol/L (ref 135–145)

## 2020-02-20 LAB — CBC
HCT: 31.8 % — ABNORMAL LOW (ref 39.0–52.0)
Hemoglobin: 10.5 g/dL — ABNORMAL LOW (ref 13.0–17.0)
MCH: 29.2 pg (ref 26.0–34.0)
MCHC: 33 g/dL (ref 30.0–36.0)
MCV: 88.6 fL (ref 80.0–100.0)
Platelets: 141 10*3/uL — ABNORMAL LOW (ref 150–400)
RBC: 3.59 MIL/uL — ABNORMAL LOW (ref 4.22–5.81)
RDW: 13.4 % (ref 11.5–15.5)
WBC: 5.2 10*3/uL (ref 4.0–10.5)
nRBC: 0 % (ref 0.0–0.2)

## 2020-02-20 MED ORDER — TAMSULOSIN HCL 0.4 MG PO CAPS
0.4000 mg | ORAL_CAPSULE | Freq: Every day | ORAL | Status: DC
  Administered 2020-02-20 – 2020-02-22 (×3): 0.4 mg via ORAL
  Filled 2020-02-20 (×3): qty 1

## 2020-02-20 MED ORDER — SODIUM CHLORIDE 0.9 % IV SOLN: INTRAVENOUS | Status: DC

## 2020-02-20 NOTE — TOC Initial Note (Signed)
Transition of Care [facility]) - Initial/Assessment Note    Patient Details  Name: Phillip [name] MRN: [id number] Date of Birth: Sep 01, 1955  Transition [facility]) CM/SW Contact:    Boris Sharper, LCSW Phone Number: 02/20/2020, 3:30 PM  Clinical Narrative:                 [facility] consult received for pt discharge needs. CSW contacted pt in regard to discharge as well as PT recommendations. Pt was agreeable to [facility] and did not have a preference in agencies. CSW contacted [facility], [facility], [facility] and [facility] and was unsuccessful in securing [facility] PT. CSW contacted [facility] and a referral has to be faxed to [phone] to see if insurance can be accepted, referral must include the discharge summary. Discharge summary unavailable at this time, this CSW was unable to fax the referral.  TOC will continue to follow.    Expected Discharge Plan: [place] Barriers to Discharge: Continued Medical Work up   Patient Goals and CMS Choice Patient states their goals for this hospitalization and ongoing recovery are:: to get home CMS Medicare.gov Compare Post Acute Care list provided to:: Patient Choice offered to / list presented to : Patient  Expected Discharge Plan and Services Expected Discharge Plan: [place]       Living arrangements for the past 2 months: Single Family Home                                      Prior Living Arrangements/Services Living arrangements for the past 2 months: Single Family Home Lives with:: Spouse Patient language and need for interpreter reviewed:: Yes Do you feel safe going back to the place where you live?: Yes      Need for Family Participation in Patient Care: Yes (Comment) Care giver support system in place?: Yes (comment) (spouse)   Criminal Activity/Legal Involvement Pertinent to Current Situation/Hospitalization: No - Comment as needed  Activities of Daily Living Home Assistive  Devices/Equipment: None ADL Screening (condition at time of admission) Patient's cognitive ability adequate to safely complete daily activities?: Yes Is the patient deaf or have difficulty hearing?: No Does the patient have difficulty seeing, even when wearing glasses/contacts?: No Does the patient have difficulty concentrating, remembering, or making decisions?: No Patient able to express need for assistance with ADLs?: Yes Does the patient have difficulty dressing or bathing?: No Independently performs ADLs?: Yes (appropriate for developmental age) Does the patient have difficulty walking or climbing stairs?: No Weakness of Legs: None Weakness of Arms/Hands: None  Permission Sought/Granted Permission sought to share information with : Facility Art therapist granted to share information with : Yes, Verbal Permission Granted  Share Information with NAME: Hilda Blades     Permission granted to share info w Relationship: spouse  Permission granted to share info w Contact Information: [phone]  Emotional Assessment Appearance:: Other (Comment Required (unable to assess) Attitude/Demeanor/Rapport: Unable to Assess Affect (typically observed): Unable to Assess Orientation: : Oriented to Self, Oriented to Place, Oriented to  Time, Oriented to Situation Alcohol / Substance Use: Not Applicable Psych Involvement: No (comment)  Admission diagnosis:  ARF (acute renal failure) (HCC) [N17.9] Right leg weakness [R29.898] Sciatica of right side [M54.31] AKI (acute kidney injury) ([place]) [N17.9] Acute kidney injury superimposed on CKD ([place]) [N17.9, N18.9] Patient Active Problem List   Diagnosis Date Noted  .  ARF (acute renal failure) ([place]) 02/19/2020  . Acute kidney injury superimposed on CKD ([place]) 02/18/2020  . Hyperlipidemia associated with type 2 diabetes mellitus ([place]) 02/18/2020  . Right groin pain 02/18/2020  . Asthma   . Coronary artery disease   . Diabetes mellitus  without complication ([place])   . GERD (gastroesophageal reflux disease)   . Heart murmur   . Hypertension associated with diabetes ([place])   . Kidney insufficiency   . Peripheral vascular disease ([place])   . Aortic stenosis, moderate 10/15/2019  . Primary osteoarthritis involving multiple joints 10/06/2019  . Anemia of chronic disease 08/18/2019  . Vitamin B12 deficiency 06/16/2019  . Mild aortic stenosis 05/12/2019  . Moderate aortic regurgitation 05/12/2019  . Mitral regurgitation 05/12/2019  . Hypoglycemia 05/07/2019  . Polyneuropathy in diabetes ([place]) 05/07/2019  . Regional wall motion abnormality of heart 05/07/2019  . Non-compliance 05/05/2019  . Chronic low back pain without sciatica 12/02/2018  . Bilateral lower extremity edema 11/04/2018  . Chronic idiopathic constipation 11/02/2018  . Insomnia 11/02/2018  . Acute kidney injury ([place]) 10/07/2018  . CKD (chronic kidney disease) stage 4, GFR 15-29 ml/min (HCC) 10/07/2018  . Abdominopelvic abscess ([place]) 09/29/2018  . Abscess of appendix 09/22/2018  . Appendicitis 09/08/2018  . Vitamin D deficiency 05/07/2018  . Puncture wound of right hip 04/02/2018  . Bradycardia 03/25/2018  . Malaise and fatigue 03/25/2018  . Subacute osteomyelitis of left foot ([place]) 03/25/2018  . Atherosclerosis of native arteries of the extremities with ulceration ([place]) 02/24/2018  . Benign hypertension with CKD (chronic kidney disease) stage IV ([place]) 02/24/2018  . Screening for colon cancer 02/18/2018  . Benign prostatic hyperplasia without lower urinary tract symptoms 01/14/2018  . Erectile dysfunction 07/14/2017  . Hypothyroidism 11/14/2015  . Hematuria 07/27/2015  . Type 2 diabetes mellitus with stage 3 chronic kidney disease, with long-term current use of insulin ([place]) 07/26/2015  . PVD (peripheral vascular disease) with claudication ([place]) 07/26/2015  . CHF (congestive heart failure) ([place]) 07/26/2015  . Bruit of right carotid artery 07/26/2015  . CAD  (coronary artery disease) 07/26/2015  . Cardiac murmur 07/26/2015  . Dysphagia 07/26/2015  . High risk medication use 07/26/2015  . Hyperlipidemia 07/26/2015  . Sleep apnea 07/26/2015  . Chronic combined systolic and diastolic CHF (congestive heart failure) ([place]) 07/26/2015  . Type 2 diabetes mellitus with stage 4 chronic kidney disease, with long-term current use of insulin ([place]) 07/26/2015   PCP:  Raina Mina., MD Pharmacy:   [facility], [address] [place] [address] Phone: [phone] Fax: [phone]  [place], [place] - [address] [place] [address] Phone: [phone] Fax: [phone]     Social Determinants of Health (SDOH) Interventions    Readmission Risk Interventions Readmission Risk Prevention Plan 09/21/2018  Transportation Screening Complete  PCP or Specialist Appt within 3-5 Days Complete  HRI or Home Care Consult Complete  Palliative Care Screening Not Applicable  Medication Review (RN Care Manager) Complete  Some recent data might be hidden

## 2020-02-20 NOTE — Progress Notes (Signed)
Subjective: The patient feels that his right hip symptoms are somewhat improved today as compared to yesterday.  He was able to get from the bed to the chair with physical therapy and to take some steps with a walker without too much pain.  He denies any new complaints.   Objective: Vital signs in last 24 hours: Temp:  [98 F (36.7 C)-98.7 F (37.1 C)] 98.6 F (37 C) (09/19 0741) Pulse Rate:  [65-68] 68 (09/19 0741) Resp:  [16] 16 (09/19 0741) BP: (141-165)/(71-85) 141/75 (09/19 0741) SpO2:  [98 %-100 %] 99 % (09/19 0741) Weight:  [96.2 kg] 96.2 kg (09/19 0615)  Intake/Output from previous day: 09/18 0701 - 09/19 0700 In: -  Out: 850 [Urine:850] Intake/Output this shift: Total I/O In: 240 [P.O.:240] Out: 550 [Urine:550]  Recent Labs    02/18/20 1346 02/19/20 0420 02/20/20 0412  HGB 12.3* 10.7* 10.5*   Recent Labs    02/19/20 0420 02/20/20 0412  WBC 7.1 5.2  RBC 3.69* 3.59*  HCT 32.6* 31.8*  PLT 139* 141*   Recent Labs    02/19/20 0420 02/20/20 0412  NA 139 139  K 3.4* 3.5  CL 101 103  CO2 27 27  BUN 88* 83*  CREATININE 3.20* 2.72*  GLUCOSE 327* 188*  CALCIUM 8.7* 8.6*   No results for input(s): LABPT, INR in the last 72 hours.  Physical Exam: Overall, the patient's physical examination findings are essentially unchanged as compared to yesterday.  He does appear to have somewhat improved hip range of motion today as he can be flexed to near 90 degrees before he experiences pain.  He again is neurovascularly intact to the right lower extremity and foot.  Assessment: Degenerative joint disease right hip, somewhat improved symptomatically.  Plan: The treatment options can have been reviewed with the patient.  We will continue to mobilize the patient with physical therapy in the hopes of enabling him to go home with a walker.  If he is able to achieve this, he can be followed up on an outpatient basis.  If not, he might require a hip replacement or at least a  steroid injection to enable him to get past this hip pain.   Marshall Cork Ashlan Dignan 02/20/2020, 2:54 PM

## 2020-02-20 NOTE — Progress Notes (Signed)
PROGRESS NOTE    Patient: Phillip [name]                            PCP: Raina Mina., MD                    DOB: 12/04/1955            DOA: 02/18/2020 HAL:[id number]             DOS: 02/20/2020, 10:34 AM   LOS: 1 day   Date of Service: The patient was seen and examined on 02/20/2020  Subjective:   The patient was seen and examined this morning, remained stable, able to ambulate.  Creatinine better.  Reporting still able to void. Still complaining of a hip pain with exertion but improved from admission time   Orthopedic team consulted explained to the patient that there is no pathological findings at this time does not need intervention may benefit from intra-articular injection if no improvement   Brief Narrative:   HPI: Phillip [name] is a 64 y.o. male w PMH/o hronic benign systolic and diastolic CHF (EF 73-53%, G3 DD by TTE 05/12/2019), CKD stage IV, CAD s/p CABG, PVD, IDT2DM, HTN, HLD,HPT,  anemia of chronic disease, and hypothyroidism who is admitted with AKI on CKD stage IV, and  right groin/hip pain.  ED Course:  Initial vitals showed BP 173/77, pulse 77, RR 19, temp 98.8 Fahrenheit, SPO2 99% on room air.  Labs show BUN 91, creatinine 3.55 (last creatinine 2.77 on 12/30/2019), GFR 20 (previously 27), sodium 139, potassium 3.7, bicarb 26, LFTs within normal limits, serum glucose 272, WBC 7.3, hemoglobin 12.3, platelets 144,000.  SARS-CoV-2 PCR is obtained and pending.  Right hip x-ray was negative for fracture or acute finding.  Mild chronic appearing bony productive changes along the inferior right acetabulum were seen.  CT pelvis without contrast was negative for acute fracture or traumatic osseous injuries or suspicious osseous lesions.  Unchanged grade 1 anterolisthesis L5 on S1 with associated bilateral L5 pars defects was seen and unchanged from prior.  Moderate right and mild left hip osteoarthritis were seen, progressive from prior imaging.  Discogenic and  facet degenerative changes at L4-L5 and L5-S1 with severe bilateral foraminal narrowing L5-S1 were seen, right greater than left, with compression on existing nerve roots.  Moderate bladder distention, diverticulosis without evidence of diverticulitis, prior appendectomy, and aortic atherosclerosis are also noted.  Patient was given 500 cc LR, 500 mg oral Robaxin, and 1000 mg Tylenol.  The hospitalist service was consulted to admit for further evaluation and management.   Assessment & Plan:   Principal Problem:   Acute kidney injury superimposed on CKD ([place]) Active Problems:   CAD (coronary artery disease)   Hypothyroidism   Chronic combined systolic and diastolic CHF (congestive heart failure) (HCC)   Anemia of chronic disease   Hypertension associated with diabetes ([place])   Type 2 diabetes mellitus with stage 4 chronic kidney disease, with long-term current use of insulin (HCC)   Hyperlipidemia associated with type 2 diabetes mellitus (HCC)   Right groin pain   ARF (acute renal failure) (HCC)    AKI on CKD stage IV: - Cr.  3.55, 3.2  >>> 2.72 this a.m. (baseline creatinine around 2.42 on 10/22/2019) -Suspect prerenal as patient appears volume depleted on admission in setting of home diuretics. -Hold home Bumex, lisinopril -Urine sodium 99, creatinine 70, -Start gentle IV fluid  hydration with NS@100  mL/hour overnight -Monitor strict I/O's and daily weights -Appreciate nephrology follow-up and recommendations  Right groin/hip pain with associated weakness and difficulty ambulating: -Orthopedic team consulted, patient seen and evaluated, Believe that pain may be referred pain from mainly spine, nerve compression narrowing foramens Versus arthritic changes--- recommending PT OT, no surgical intervention, anticipating possible intracuticular steroid injection  -MRI of the hip lumbar spine-reviewed-review of revealing  Bilateral L5 pars interarticularis defects with grade 1.   anterolisthesis and severe bilateral neural foraminal stenosis CT pelvis without evidence of fracture, traumatic osseous injuries, or suspicious osseous lesions.  Severe bilateral foraminal narrowing L5-S1 seen on CT imaging.  -Continue Tylenol as needed for pain -Continue home gabapentin at reduced dose due to renal dysfunction -Request PT/OT eval  Chronic combined systolic and diastolic CHF: -We will continue to monitor closely, no signs of exacerbation EF 30-35%, G3 DD by TTE 05/12/2019.  Appears volume depleted on admission as above. BNP 406.2 -Holding home Bumex and lisinopril -Continue carvedilol and Imdur -Continue gentle IV fluid hydration overnight -Strict I/O's and daily weights  Insulin-dependent type 2 diabetes: Uncontrolled -Checking his blood sugar QA CHS, with SSI coverage --CBGs 209, 264, 156 this a.m. Hyperglycemic on admission.   Continue home Levemir 10 units nightly and add very sensitive SSI while in hospital.   -A1c.  9.2  CAD s/p CABG/ PVD: Stable without chest pain.  Continue aspirin, Plavix, Coreg Imdur, statin.  Hypertension: Stable Continue home Coreg, clonidine, Imdur.  Holding lisinopril in setting of AKI.  Hyperlipidemia: Continue statin.  Anemia of chronic renal disease: Chronic-stable Continue monitor.  Hypothyroidism: Continue Synthroid. Remained stable   DVT prophylaxis: Subcutaneous heparin Code Status: Full code, confirmed with patient Family Communication: Discussed with patient, he has discussed with family Disposition Plan: From home, dispo pending PT/OT eval Consults called: None Admission status:     Status is: Inpatient  Remain inpatient, continued benefit from consultants input, IV fluid, for acute on chronic renal failure, continued pain difficulty ambulation  Dispo: The patient is from: Home  Anticipated d/c is to: Home vs SNF pending PT/OT eval  Anticipated d/c date is: 1  day  Patient currently is not medically stable to d/c.       Consultants: Neohro. Orhto    --------------------------------------------------------------------------------------------------------------------------------------------     Procedures:   No admission procedures for hospital encounter.     Antimicrobials:  Anti-infectives (From admission, onward)   None       Medication:  . aspirin EC  81 mg Oral Daily  . carvedilol  6.25 mg Oral BID WC  . cloNIDine  0.3 mg Oral BID  . clopidogrel  75 mg Oral Daily  . gabapentin  300 mg Oral BID  . heparin  5,000 Units Subcutaneous Q8H  . insulin aspart  0-6 Units Subcutaneous TID WC  . insulin detemir  10 Units Subcutaneous Daily  . isosorbide mononitrate  120 mg Oral Daily  . levothyroxine  25 mcg Oral Daily  . pravastatin  20 mg Oral q1800  . sodium chloride flush  3 mL Intravenous Q12H    acetaminophen **OR** acetaminophen, ondansetron **OR** ondansetron (ZOFRAN) IV   Objective:   Vitals:   02/20/20 0028 02/20/20 0435 02/20/20 0615 02/20/20 0741  BP: (!) 146/71 (!) 145/75  (!) 141/75  Pulse: 67 68  68  Resp: 16 16  16   Temp: 98.4 F (36.9 C) 98.7 F (37.1 C)  98.6 F (37 C)  TempSrc: Oral Oral  Oral  SpO2: 100%  98%  99%  Weight:   96.2 kg   Height:        Intake/Output Summary (Last 24 hours) at 02/20/2020 1034 Last data filed at 02/20/2020 1008 Gross per 24 hour  Intake 240 ml  Output 950 ml  Net -710 ml   Filed Weights   02/18/20 1331 02/20/20 0615  Weight: 99.3 kg 96.2 kg     Examination:      Physical Exam:   General:  Alert, oriented, cooperative, no distress;   HEENT:  Normocephalic, PERRL, otherwise with in Normal limits   Neuro:  CNII-XII intact. , normal motor and sensation, reflexes intact   Lungs:   Clear to auscultation BL, Respirations unlabored, no wheezes / crackles  Cardio:    S1/S2, RRR, No murmure, No Rubs or Gallops   Abdomen:   Soft, non-tender,  bowel sounds active all four quadrants,  no guarding or peritoneal signs.  Muscular skeletal:   Continue to complain of hip pain, but improved this morning able to ambulate some  Limited exam - in bed, able to move all 4 extremities, Normal strength,  2+ pulses,  symmetric, No pitting edema  Skin:  Dry, warm to touch, negative for any Rashes, No open wounds  Wounds: Please see nursing documentation         ------------------------------------------------------------------------------------------------------------------------------------------    LABs:  CBC Latest Ref Rng & Units 02/20/2020 02/19/2020 02/18/2020  WBC 4.0 - 10.5 K/uL 5.2 7.1 7.3  Hemoglobin 13.0 - 17.0 g/dL 10.5(L) 10.7(L) 12.3(L)  Hematocrit 39 - 52 % 31.8(L) 32.6(L) 36.2(L)  Platelets 150 - 400 K/uL 141(L) 139(L) 154   CMP Latest Ref Rng & Units 02/20/2020 02/19/2020 02/18/2020  Glucose 70 - 99 mg/dL 188(H) 327(H) 272(H)  BUN 8 - 23 mg/dL 83(H) 88(H) 91(H)  Creatinine 0.61 - 1.24 mg/dL 2.72(H) 3.20(H) 3.55(H)  Sodium 135 - 145 mmol/L 139 139 139  Potassium 3.5 - 5.1 mmol/L 3.5 3.4(L) 3.7  Chloride 98 - 111 mmol/L 103 101 100  CO2 22 - 32 mmol/L 27 27 26   Calcium 8.9 - 10.3 mg/dL 8.6(L) 8.7(L) 8.7(L)  Total Protein 6.5 - 8.1 g/dL - - 8.3(H)  Total Bilirubin 0.3 - 1.2 mg/dL - - 0.8  Alkaline Phos 38 - 126 U/L - - 77  AST 15 - 41 U/L - - 16  ALT 0 - 44 U/L - - 12       Micro Results Recent Results (from the past 240 hour(s))  SARS Coronavirus 2 by RT PCR (hospital order, performed in [facility] hospital lab) Nasopharyngeal Nasopharyngeal Swab     Status: None   Collection Time: 02/18/20 10:38 PM   Specimen: Nasopharyngeal Swab  Result Value Ref Range Status   SARS Coronavirus 2 NEGATIVE NEGATIVE Final    Comment: (NOTE) SARS-CoV-2 target nucleic acids are NOT DETECTED.  The SARS-CoV-2 RNA is generally detectable in upper and lower respiratory specimens during the acute phase of infection. The  lowest concentration of SARS-CoV-2 viral copies this assay can detect is 250 copies / mL. A negative result does not preclude SARS-CoV-2 infection and should not be used as the sole basis for treatment or other patient management decisions.  A negative result may occur with improper specimen collection / handling, submission of specimen other than nasopharyngeal swab, presence of viral mutation(s) within the areas targeted by this assay, and inadequate number of viral copies (<250 copies / mL). A negative result must be combined with clinical observations, patient history, and epidemiological information.  Fact Sheet for Patients:   StrictlyIdeas.no  Fact Sheet for Healthcare Providers: BankingDealers.co.za  This test is not yet approved or  cleared by the [place] FDA and has been authorized for detection and/or diagnosis of SARS-CoV-2 by FDA under an Emergency Use Authorization (EUA).  This EUA will remain in effect (meaning this test can be used) for the duration of the COVID-19 declaration under Section 564(b)(1) of the Act, 21 U.S.C. section 360bbb-3(b)(1), unless the authorization is terminated or revoked sooner.  Performed at [facility], [address], [place], [place] [address]     Radiology Reports CT PELVIS WO CONTRAST  Result Date: 02/18/2020 CLINICAL DATA:  Pelvic pain, stress fracture suspected, severe right hip pain and sciatica. Groin pain radiating to the leg, unable to bear weight since Tuesday EXAM: CT PELVIS WITHOUT CONTRAST TECHNIQUE: Multidetector CT imaging of the pelvis was performed following the standard protocol without intravenous contrast. COMPARISON:  CT abdomen pelvis 10/05/2018, hip radiograph 02/18/2020 FINDINGS: Urinary Tract: Bladder distention with indentation of the bladder base by an enlarged prostate. No bladder wall thickening, visible calculi or debris. Distal ureters are  unremarkable. Bowel: No abnormal bowel wall thickening, dilatation or evidence of obstruction. Scattered colonic diverticula without focal inflammation to suggest diverticulitis. The appendix is surgically absent. Vascular/Lymphatic: Aortoiliac atherosclerosis with additional calcifications in the included proximal outflow vasculature as well. Luminal evaluation precluded in the absence of contrast media. No suspicious or enlarged lymph nodes in the included lymphatic chains. Reproductive: Mild prostatomegaly with slight indentation of the bladder base by some median lobe hypertrophy. Seminal vesicles are unremarkable. Other: Mild body wall edema. No bowel containing hernia. Small fat containing inguinal hernias. No free pelvic air or fluid. Musculoskeletal: Redemonstration of 8 mm of grade 1 anterolisthesis L5 on S1 with associated bilateral L5 pars defects which appear well corticated, chronic and unchanged from comparison study. Slight retrolisthesis of L4 on L5 is also unchanged from comparison. Discogenic and facet degenerative changes are present at the included L4-S1 levels. Mild canal stenosis and moderate bilateral foraminal narrowing L4-L5 and severe bilateral foraminal narrowing L5-S1, right greater than left with compression of the exiting nerve root (7/89 which is similar to prior but could present is a pain generator in this patient. The sacrum is intact. No sacral insufficiency fractures are identified. Partial ankylosis along superior margins of the bilateral SI joints with mild underlying sacroiliac arthrosis unchanged from comparison. Mild angulation at the sacrococcygeal junction is chronic and unchanged from prior. There is moderate right and mild left hip osteoarthrosis including periacetabular spurring, subchondral sclerosis and cyst formation. Femoral heads however remain normally located in the proximal femora and cells are intact. These features are progressive from comparison imaging.  IMPRESSION: 1. No acute fracture or traumatic osseous injuries. No suspicious osseous lesions. 2. Unchanged 8 mm of grade 1 anterolisthesis L5 on S1 with associated bilateral L5 pars defects which appear well corticated, chronic and unchanged from comparison study. 3. Moderate right and mild left hip osteoarthrosis, progressive from comparison imaging. 4. Discogenic and facet degenerative changes at L4-L5 and L5-S1 with severe bilateral foraminal narrowing L5-S1, right greater than left, with resulting compression of the exiting nerve roots. 5. Moderate bladder distention with indentation of the bladder base by an enlarged prostate. Correlate for symptoms of outlet obstruction. 6. Diverticulosis without evidence of diverticulitis. 7. Prior appendectomy. 8. Aortic Atherosclerosis (ICD10-I70.0). Electronically Signed   By: Lovena Le M.D.   On: 02/18/2020 22:15   MR LUMBAR SPINE WO CONTRAST  Result Date: 02/19/2020 CLINICAL DATA:  Low back pain radiating to the right lower extremity EXAM: MRI LUMBAR SPINE WITHOUT CONTRAST TECHNIQUE: Multiplanar, multisequence MR imaging of the lumbar spine was performed. No intravenous contrast was administered. COMPARISON:  None. FINDINGS: Segmentation:  Standard. Alignment: Grade 1 retrolisthesis at L4-5 and grade 1 anterolisthesis at L5-S1 Vertebrae:  There are bilateral L5 pars interarticularis defects Conus medullaris and cauda equina: Conus extends to the L1 level. Conus and cauda equina appear normal. Paraspinal and other soft tissues: Markedly distended urinary bladder. Disc levels: L1-L2: Normal disc space and facet joints. There is no spinal canal stenosis. No neural foraminal stenosis. L2-L3: Normal disc space and facet joints. There is no spinal canal stenosis. No neural foraminal stenosis. L3-L4: Normal disc space and facet joints. There is no spinal canal stenosis. No neural foraminal stenosis. L4-L5: Mild facet hypertrophy with fluid in the facet joints. Small  disc bulge. There is no spinal canal stenosis. Mild bilateral neural foraminal stenosis. L5-S1: Disc space narrowing and partial uncovering. There is no spinal canal stenosis. Severe bilateral neural foraminal stenosis. Visualized sacrum: Normal. IMPRESSION: 1. Bilateral L5 pars interarticularis defects with grade 1 anterolisthesis and severe bilateral neural foraminal stenosis. 2. Mild bilateral L4-5 neural foraminal stenosis. 3. Markedly distended urinary bladder. Electronically Signed   By: Ulyses Jarred M.D.   On: 02/19/2020 00:00   MR HIP RIGHT WO CONTRAST  Result Date: 02/19/2020 CLINICAL DATA:  Right hip and groin pain. EXAM: MR OF THE RIGHT HIP WITHOUT CONTRAST TECHNIQUE: Multiplanar, multisequence MR imaging was performed. No intravenous contrast was administered. COMPARISON:  None. FINDINGS: Bones: No hip fracture, dislocation or avascular necrosis. No periosteal reaction or bone destruction. No aggressive osseous lesion. Normal sacrum and sacroiliac joints. No SI joint widening or erosive changes. Degenerative disease with disc height loss and facet arthropathy at L4-5 and L5-S1. Articular cartilage and labrum Articular cartilage: High-grade partial-thickness cartilage loss of the right femoral head and acetabulum with subchondral marrow edema in the acetabulum. Labrum: Right labral degeneration with a tear of the superior and anterior labrum. Joint or bursal effusion Joint effusion: Small right hip joint effusion. No left hip joint effusion. No SI joint effusion. Bursae:  No bursa formation. Muscles and tendons Flexors: Normal. Extensors: Mild muscle edema in the vastus intermedius muscle proximally likely reflecting muscle strain. Remainder of the muscles are normal. Abductors: Normal. Adductors: Normal. Gluteals: Normal. Hamstrings: Normal. Other findings No pelvic free fluid. No fluid collection or hematoma. No inguinal lymphadenopathy. No inguinal hernia. Distended bladder. IMPRESSION: 1. No hip  fracture, dislocation or avascular necrosis. 2. Moderate-severe osteoarthritis of the right hip. 3. Right labral degeneration with a tear of the superior and anterior labrum. Electronically Signed   By: Kathreen Devoid   On: 02/19/2020 13:03   DG Hip Unilat W or Wo Pelvis 2-3 Views Right  Result Date: 02/18/2020 CLINICAL DATA:  Right hip and groin pain for the last 4 days. Pain with movement. No injury. EXAM: DG HIP (WITH OR WITHOUT PELVIS) 2-3V RIGHT COMPARISON:  None. FINDINGS: No fracture or bone lesion. Hip joints are normally spaced and aligned. On the right, there is bony productive change along the inferior margin of the acetabulum. SI joints and symphysis pubis are normally spaced and aligned. Arterial atherosclerotic calcifications are noted along the iliac and femoral vessels. Soft tissues are otherwise unremarkable. IMPRESSION: 1. No fracture or acute finding. 2. Mild chronic appearing bony productive changes along the inferior right acetabulum. No other degenerative/arthropathic changes. Electronically  Signed   By: Lajean Manes M.D.   On: 02/18/2020 14:21    SIGNED: Deatra James, MD, FACP, FHM. [facility],  Pager (please use [email or web address] to page/[email or web address])  If 7PM-7AM, please contact night-coverage [email or web address] O[facility] 02/20/2020, 10:34 AM

## 2020-02-20 NOTE — Progress Notes (Signed)
[facility]  ROUNDING NOTE   Subjective:   Sitting today. States his hip pain has improved.   Creatinine 2.72 (3.2) Started on IV normal saline  Objective:  Vital signs in last 24 hours:  Temp:  [98 F (36.7 C)-98.7 F (37.1 C)] 98.6 F (37 C) (09/19 0741) Pulse Rate:  [65-68] 68 (09/19 0741) Resp:  [16] 16 (09/19 0741) BP: (141-165)/(71-85) 141/75 (09/19 0741) SpO2:  [98 %-100 %] 99 % (09/19 0741) Weight:  [96.2 kg] 96.2 kg (09/19 0615)  Weight change: -3.175 kg Filed Weights   02/18/20 1331 02/20/20 0615  Weight: 99.3 kg 96.2 kg    Intake/Output: I/O last 3 completed shifts: In: 503 [I.V.:3; IV Piggyback:500] Out: 2706 [Urine:1650]   Intake/Output this shift:  Total I/O In: 240 [P.O.:240] Out: 550 [Urine:550]  Physical Exam: General: NAD, sitting in chair  Head: Normocephalic, atraumatic. Moist oral mucosal membranes  Eyes: Anicteric, PERRL  Neck: Supple, trachea midline  Lungs:  Clear to auscultation  Heart: Regular rate and rhythm  Abdomen:  Soft, nontender,   Extremities: no peripheral edema.  Neurologic: Nonfocal, moving all four extremities  Skin: No lesions  Access: none    Basic Metabolic Panel: Recent Labs  Lab 02/18/20 1346 02/19/20 0420 02/20/20 0412  NA 139 139 139  K 3.7 3.4* 3.5  CL 100 101 103  CO2 26 27 27   GLUCOSE 272* 327* 188*  BUN 91* 88* 83*  CREATININE 3.55* 3.20* 2.72*  CALCIUM 8.7* 8.7* 8.6*    Liver Function Tests: Recent Labs  Lab 02/18/20 1346  AST 16  ALT 12  ALKPHOS 77  BILITOT 0.8  PROT 8.3*  ALBUMIN 3.9   No results for input(s): LIPASE, AMYLASE in the last 168 hours. No results for input(s): AMMONIA in the last 168 hours.  CBC: Recent Labs  Lab 02/18/20 1346 02/19/20 0420 02/20/20 0412  WBC 7.3 7.1 5.2  HGB 12.3* 10.7* 10.5*  HCT 36.2* 32.6* 31.8*  MCV 86.2 88.3 88.6  PLT 154 139* 141*    Cardiac Enzymes: No results for input(s): CKTOTAL, CKMB, CKMBINDEX, TROPONINI in the  last 168 hours.  BNP: Invalid input(s): POCBNP  CBG: Recent Labs  Lab 02/19/20 1341 02/19/20 1648 02/19/20 2115 02/20/20 0738 02/20/20 1130  GLUCAP 208* 209* 264* 156* 195*    Microbiology: Results for orders placed or performed during the hospital encounter of 02/18/20  SARS Coronavirus 2 by RT PCR (hospital order, performed in [facility] hospital lab) Nasopharyngeal Nasopharyngeal Swab     Status: None   Collection Time: 02/18/20 10:38 PM   Specimen: Nasopharyngeal Swab  Result Value Ref Range Status   SARS Coronavirus 2 NEGATIVE NEGATIVE Final    Comment: (NOTE) SARS-CoV-2 target nucleic acids are NOT DETECTED.  The SARS-CoV-2 RNA is generally detectable in upper and lower respiratory specimens during the acute phase of infection. The lowest concentration of SARS-CoV-2 viral copies this assay can detect is 250 copies / mL. A negative result does not preclude SARS-CoV-2 infection and should not be used as the sole basis for treatment or other patient management decisions.  A negative result may occur with improper specimen collection / handling, submission of specimen other than nasopharyngeal swab, presence of viral mutation(s) within the areas targeted by this assay, and inadequate number of viral copies (<250 copies / mL). A negative result must be combined with clinical observations, patient history, and epidemiological information.  Fact Sheet for Patients:   StrictlyIdeas.no  Fact Sheet for Healthcare Providers: BankingDealers.co.za  This test is not yet approved or  cleared by the [place] and has been authorized for detection and/or diagnosis of SARS-CoV-2 by FDA under an Emergency Use Authorization (EUA).  This EUA will remain in effect (meaning this test can be used) for the duration of the COVID-19 declaration under Section 564(b)(1) of the Act, 21 U.S.C. section 360bbb-3(b)(1), unless the  authorization is terminated or revoked sooner.  Performed at [facility], [address] [place], [place] [address]     Coagulation Studies: No results for input(s): LABPROT, INR in the last 72 hours.  Urinalysis: Recent Labs    02/19/20 0130  COLORURINE YELLOW*  LABSPEC 1.012  PHURINE 5.0  GLUCOSEU 50*  HGBUR SMALL*  BILIRUBINUR NEGATIVE  KETONESUR NEGATIVE  PROTEINUR 100*  NITRITE NEGATIVE  LEUKOCYTESUR NEGATIVE      Imaging: CT PELVIS WO CONTRAST  Result Date: 02/18/2020 CLINICAL DATA:  Pelvic pain, stress fracture suspected, severe right hip pain and sciatica. Groin pain radiating to the leg, unable to bear weight since Tuesday EXAM: CT PELVIS WITHOUT CONTRAST TECHNIQUE: Multidetector CT imaging of the pelvis was performed following the standard protocol without intravenous contrast. COMPARISON:  CT abdomen pelvis 10/05/2018, hip radiograph 02/18/2020 FINDINGS: Urinary Tract: Bladder distention with indentation of the bladder base by an enlarged prostate. No bladder wall thickening, visible calculi or debris. Distal ureters are unremarkable. Bowel: No abnormal bowel wall thickening, dilatation or evidence of obstruction. Scattered colonic diverticula without focal inflammation to suggest diverticulitis. The appendix is surgically absent. Vascular/Lymphatic: Aortoiliac atherosclerosis with additional calcifications in the included proximal outflow vasculature as well. Luminal evaluation precluded in the absence of contrast media. No suspicious or enlarged lymph nodes in the included lymphatic chains. Reproductive: Mild prostatomegaly with slight indentation of the bladder base by some median lobe hypertrophy. Seminal vesicles are unremarkable. Other: Mild body wall edema. No bowel containing hernia. Small fat containing inguinal hernias. No free pelvic air or fluid. Musculoskeletal: Redemonstration of 8 mm of grade 1 anterolisthesis L5 on S1 with associated bilateral L5  pars defects which appear well corticated, chronic and unchanged from comparison study. Slight retrolisthesis of L4 on L5 is also unchanged from comparison. Discogenic and facet degenerative changes are present at the included L4-S1 levels. Mild canal stenosis and moderate bilateral foraminal narrowing L4-L5 and severe bilateral foraminal narrowing L5-S1, right greater than left with compression of the exiting nerve root (7/89 which is similar to prior but could present is a pain generator in this patient. The sacrum is intact. No sacral insufficiency fractures are identified. Partial ankylosis along superior margins of the bilateral SI joints with mild underlying sacroiliac arthrosis unchanged from comparison. Mild angulation at the sacrococcygeal junction is chronic and unchanged from prior. There is moderate right and mild left hip osteoarthrosis including periacetabular spurring, subchondral sclerosis and cyst formation. Femoral heads however remain normally located in the proximal femora and cells are intact. These features are progressive from comparison imaging. IMPRESSION: 1. No acute fracture or traumatic osseous injuries. No suspicious osseous lesions. 2. Unchanged 8 mm of grade 1 anterolisthesis L5 on S1 with associated bilateral L5 pars defects which appear well corticated, chronic and unchanged from comparison study. 3. Moderate right and mild left hip osteoarthrosis, progressive from comparison imaging. 4. Discogenic and facet degenerative changes at L4-L5 and L5-S1 with severe bilateral foraminal narrowing L5-S1, right greater than left, with resulting compression of the exiting nerve roots. 5. Moderate bladder distention with indentation of the bladder base by an enlarged  prostate. Correlate for symptoms of outlet obstruction. 6. Diverticulosis without evidence of diverticulitis. 7. Prior appendectomy. 8. Aortic Atherosclerosis (ICD10-I70.0). Electronically Signed   By: Lovena Le M.D.   On:  02/18/2020 22:15   MR LUMBAR SPINE WO CONTRAST  Result Date: 02/19/2020 CLINICAL DATA:  Low back pain radiating to the right lower extremity EXAM: MRI LUMBAR SPINE WITHOUT CONTRAST TECHNIQUE: Multiplanar, multisequence MR imaging of the lumbar spine was performed. No intravenous contrast was administered. COMPARISON:  None. FINDINGS: Segmentation:  Standard. Alignment: Grade 1 retrolisthesis at L4-5 and grade 1 anterolisthesis at L5-S1 Vertebrae:  There are bilateral L5 pars interarticularis defects Conus medullaris and cauda equina: Conus extends to the L1 level. Conus and cauda equina appear normal. Paraspinal and other soft tissues: Markedly distended urinary bladder. Disc levels: L1-L2: Normal disc space and facet joints. There is no spinal canal stenosis. No neural foraminal stenosis. L2-L3: Normal disc space and facet joints. There is no spinal canal stenosis. No neural foraminal stenosis. L3-L4: Normal disc space and facet joints. There is no spinal canal stenosis. No neural foraminal stenosis. L4-L5: Mild facet hypertrophy with fluid in the facet joints. Small disc bulge. There is no spinal canal stenosis. Mild bilateral neural foraminal stenosis. L5-S1: Disc space narrowing and partial uncovering. There is no spinal canal stenosis. Severe bilateral neural foraminal stenosis. Visualized sacrum: Normal. IMPRESSION: 1. Bilateral L5 pars interarticularis defects with grade 1 anterolisthesis and severe bilateral neural foraminal stenosis. 2. Mild bilateral L4-5 neural foraminal stenosis. 3. Markedly distended urinary bladder. Electronically Signed   By: Ulyses Jarred M.D.   On: 02/19/2020 00:00   MR HIP RIGHT WO CONTRAST  Result Date: 02/19/2020 CLINICAL DATA:  Right hip and groin pain. EXAM: MR OF THE RIGHT HIP WITHOUT CONTRAST TECHNIQUE: Multiplanar, multisequence MR imaging was performed. No intravenous contrast was administered. COMPARISON:  None. FINDINGS: Bones: No hip fracture, dislocation or  avascular necrosis. No periosteal reaction or bone destruction. No aggressive osseous lesion. Normal sacrum and sacroiliac joints. No SI joint widening or erosive changes. Degenerative disease with disc height loss and facet arthropathy at L4-5 and L5-S1. Articular cartilage and labrum Articular cartilage: High-grade partial-thickness cartilage loss of the right femoral head and acetabulum with subchondral marrow edema in the acetabulum. Labrum: Right labral degeneration with a tear of the superior and anterior labrum. Joint or bursal effusion Joint effusion: Small right hip joint effusion. No left hip joint effusion. No SI joint effusion. Bursae:  No bursa formation. Muscles and tendons Flexors: Normal. Extensors: Mild muscle edema in the vastus intermedius muscle proximally likely reflecting muscle strain. Remainder of the muscles are normal. Abductors: Normal. Adductors: Normal. Gluteals: Normal. Hamstrings: Normal. Other findings No pelvic free fluid. No fluid collection or hematoma. No inguinal lymphadenopathy. No inguinal hernia. Distended bladder. IMPRESSION: 1. No hip fracture, dislocation or avascular necrosis. 2. Moderate-severe osteoarthritis of the right hip. 3. Right labral degeneration with a tear of the superior and anterior labrum. Electronically Signed   By: Kathreen Devoid   On: 02/19/2020 13:03   DG Hip Unilat W or Wo Pelvis 2-3 Views Right  Result Date: 02/18/2020 CLINICAL DATA:  Right hip and groin pain for the last 4 days. Pain with movement. No injury. EXAM: DG HIP (WITH OR WITHOUT PELVIS) 2-3V RIGHT COMPARISON:  None. FINDINGS: No fracture or bone lesion. Hip joints are normally spaced and aligned. On the right, there is bony productive change along the inferior margin of the acetabulum. SI joints and symphysis pubis are normally spaced  and aligned. Arterial atherosclerotic calcifications are noted along the iliac and femoral vessels. Soft tissues are otherwise unremarkable. IMPRESSION: 1.  No fracture or acute finding. 2. Mild chronic appearing bony productive changes along the inferior right acetabulum. No other degenerative/arthropathic changes. Electronically Signed   By: Lajean Manes M.D.   On: 02/18/2020 14:21     Medications:   . sodium chloride 50 mL/hr at 02/20/20 1125   . aspirin EC  81 mg Oral Daily  . carvedilol  6.25 mg Oral BID WC  . cloNIDine  0.3 mg Oral BID  . clopidogrel  75 mg Oral Daily  . gabapentin  300 mg Oral BID  . heparin  5,000 Units Subcutaneous Q8H  . insulin aspart  0-6 Units Subcutaneous TID WC  . insulin detemir  10 Units Subcutaneous Daily  . isosorbide mononitrate  120 mg Oral Daily  . levothyroxine  25 mcg Oral Daily  . pravastatin  20 mg Oral q1800  . sodium chloride flush  3 mL Intravenous Q12H   acetaminophen **OR** acetaminophen, ondansetron **OR** ondansetron (ZOFRAN) IV  Assessment/ Plan:  Phillip [name] is a 64 y.o. black male with diabetes mellitus type II, diabetic neuropathy, hypertension, coronary artery disease status post CABG, peripheral vascular disease, status post left toe amputation, diastolic congestive heart failure, COPD, who was admitted to [facility] on 02/18/2020 for ARF (acute renal failure) ([place]) [N17.9] Right leg weakness [R29.898] Sciatica of right side [M54.31] AKI (acute kidney injury) ([place]) [N17.9] Acute kidney injury superimposed on CKD ([place]) [N17.9, N18.9]  1. Acute renal failure on chronic kidney disease stage IV with proteinuria: baseline creatinine of 2.77 on 12/30/19 Chronic kidney disease secondary to diabetes and hypertension No exposure to IV contrast Acute renal failure could be due to obstructive uropathy - discontinuation of IV fluids - Start alpha blocker for blood pressure and prostate health - holding bumetanide, lisinopril and metolazone  2. Hypertension: history of difficult to control. Home regimen of carvedilol, clonidine, lisinopril, bumetanide, and metolazone. Holding lisinopril,  metolazone and bumetanide - Continue clonidine and carvedilol - start tamsulosin   3. Diabetes mellitus type II with chronic kidney disease: insulin dependent. Hemoglobin A1c of 9.2%. Not well controlled.   4. Anemia with renal failure: normocytic.    LOS: 1 Willena Jeancharles 9/19/202112:43 PM

## 2020-02-21 ENCOUNTER — Telehealth: Payer: Self-pay | Admitting: Cardiology

## 2020-02-21 ENCOUNTER — Inpatient Hospital Stay: Payer: PPO

## 2020-02-21 DIAGNOSIS — M1611 Unilateral primary osteoarthritis, right hip: Secondary | ICD-10-CM | POA: Insufficient documentation

## 2020-02-21 HISTORY — DX: Unilateral primary osteoarthritis, right hip: M16.11

## 2020-02-21 LAB — GLUCOSE, CAPILLARY
Glucose-Capillary: 194 mg/dL — ABNORMAL HIGH (ref 70–99)
Glucose-Capillary: 362 mg/dL — ABNORMAL HIGH (ref 70–99)
Glucose-Capillary: 246 mg/dL — ABNORMAL HIGH (ref 70–99)
Glucose-Capillary: 265 mg/dL — ABNORMAL HIGH (ref 70–99)

## 2020-02-21 LAB — CBC
HCT: 32 % — ABNORMAL LOW (ref 39.0–52.0)
Hemoglobin: 10.6 g/dL — ABNORMAL LOW (ref 13.0–17.0)
MCH: 29.3 pg (ref 26.0–34.0)
MCHC: 33.1 g/dL (ref 30.0–36.0)
MCV: 88.4 fL (ref 80.0–100.0)
Platelets: 140 10*3/uL — ABNORMAL LOW (ref 150–400)
RBC: 3.62 MIL/uL — ABNORMAL LOW (ref 4.22–5.81)
RDW: 13.4 % (ref 11.5–15.5)
WBC: 4.5 10*3/uL (ref 4.0–10.5)
nRBC: 0 % (ref 0.0–0.2)

## 2020-02-21 LAB — BASIC METABOLIC PANEL
Anion gap: 11 (ref 5–15)
BUN: 84 mg/dL — ABNORMAL HIGH (ref 8–23)
CO2: 25 mmol/L (ref 22–32)
Calcium: 8.7 mg/dL — ABNORMAL LOW (ref 8.9–10.3)
Chloride: 101 mmol/L (ref 98–111)
Creatinine, Ser: 2.93 mg/dL — ABNORMAL HIGH (ref 0.61–1.24)
GFR calc Af Amer: 25 mL/min — ABNORMAL LOW (ref >60)
GFR calc non Af Amer: 22 mL/min — ABNORMAL LOW (ref >60)
Glucose, Bld: 195 mg/dL — ABNORMAL HIGH (ref 70–99)
Potassium: 3.5 mmol/L (ref 3.5–5.1)
Sodium: 137 mmol/L (ref 135–145)

## 2020-02-21 LAB — UREA NITROGEN, URINE: Urea Nitrogen, Ur: 495 mg/dL

## 2020-02-21 IMAGING — RF DG FLUORO GUIDE NDL PLC/BX
2 series · 2 of 2 positions shown · non-contrast
Comparison: none

CLINICAL DATA: Right hip pain

[Series 1: cp_standard · 0.17mm/px · 1 of 1 slices shown (1 of 2)]
[im 1/1]
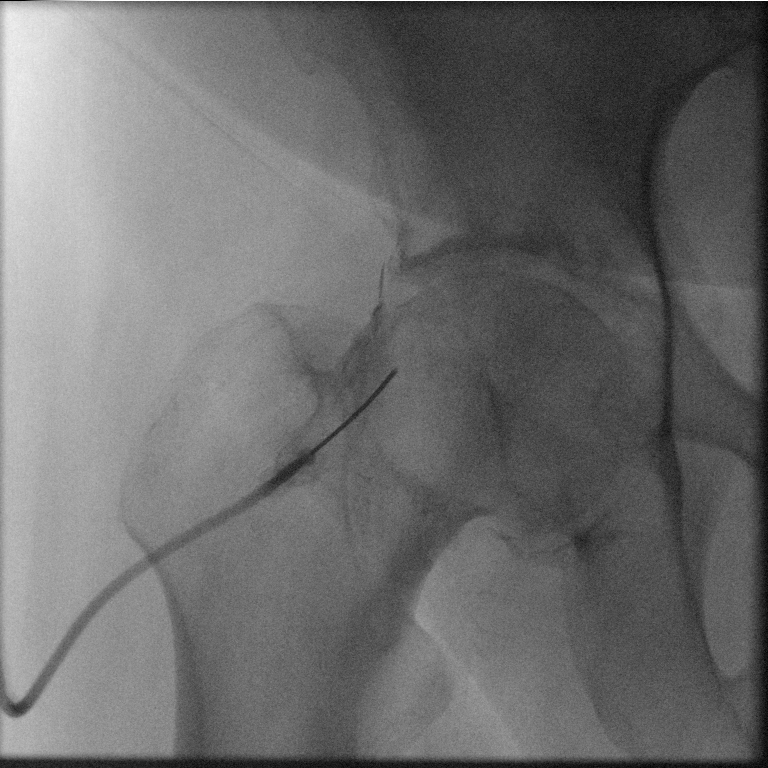

[Series 2: cp_standard · 0.17mm/px · 1 of 1 slices shown (2 of 2)]
[im 1/1]
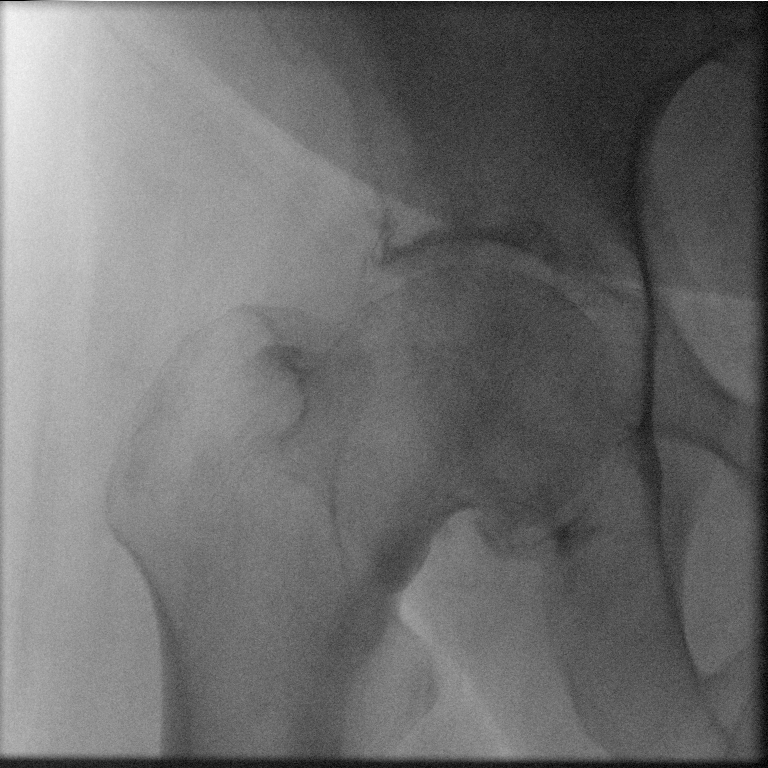

[2 of 2 positions shown; findings below may reference images not displayed]

EXAM:
RIGHT HIP INJECTION UNDER FLUOROSCOPY

FLUOROSCOPY TIME:  Fluoroscopy Time:  12 seconds

Radiation Exposure Index (if provided by the fluoroscopic device):
3.3 mGy

Number of Acquired Spot Images: 2

PROCEDURE:
Informed written consent was obtained from the patient after
discussion of the risks, benefits and alternatives to treatment.

The patient was placed supine on the fluoroscopy table with right
leg in slight internal rotation. The right hip was localized with
fluoroscopy. The skin overlying the anterior aspect of the hip was
prepped and draped in usual sterile fashion. The skin and
subcutaneous tissues were anesthetized with 1% lidocaine. A 20 gauge
3.5 inch spinal needle was then advanced to the lateral aspect of
the femoral head-neck junction under intermittent fluoroscopy. A
small amount of lidocaine was injected, which flowed freely. Then,
injection of 2 mL Omnipaque 180 confirms appropriate positioning
within the right hip joint capsule. A fluoroscopic image was saved
and sent to PACs. Therapeutic injection was performed with the
administration of 1 mL of Kenalog 40 mg/mL mixed with 5 mL
ropivacaine 0.5% which injected without difficulty. The needle was
removed and hemostasis was achieved. A dressing was placed. The
patient tolerated the procedure well without immediate
postprocedural complication.
IMPRESSION: Technically successful right hip steroid injection under
fluoroscopy.

## 2020-02-21 MED ORDER — ROPIVACAINE HCL 5 MG/ML IJ SOLN
5.0000 mL | Freq: Once | INTRAMUSCULAR | Status: DC
  Filled 2020-02-21: qty 5

## 2020-02-21 MED ORDER — LIDOCAINE HCL (PF) 1 % IJ SOLN
5.0000 mL | Freq: Once | INTRAMUSCULAR | Status: DC
  Filled 2020-02-21: qty 5

## 2020-02-21 MED ORDER — ROPIVACAINE HCL 5 MG/ML IJ SOLN
INTRAMUSCULAR | Status: AC
  Filled 2020-02-21: qty 30

## 2020-02-21 MED ORDER — BUPIVACAINE HCL (PF) 0.25 % IJ SOLN
INTRAMUSCULAR | Status: AC
  Filled 2020-02-21: qty 30

## 2020-02-21 MED ORDER — IOHEXOL 180 MG/ML  SOLN: 9.0000 mL | Freq: Once | INTRAMUSCULAR | Status: DC | PRN

## 2020-02-21 MED ORDER — TRIAMCINOLONE ACETONIDE 40 MG/ML IJ SUSP
INTRAMUSCULAR | Status: AC
  Filled 2020-02-21: qty 1

## 2020-02-21 MED ORDER — TRIAMCINOLONE ACETONIDE 40 MG/ML IJ SUSP (RADIOLOGY): 40.0000 mg | Freq: Once | INTRAMUSCULAR | Status: DC

## 2020-02-21 MED ORDER — INSULIN DETEMIR 100 UNIT/ML ~~LOC~~ SOLN
15.0000 [IU] | Freq: Every day | SUBCUTANEOUS | Status: DC
  Administered 2020-02-21: 15 [IU] via SUBCUTANEOUS
  Filled 2020-02-21 (×2): qty 0.15

## 2020-02-21 NOTE — Progress Notes (Signed)
Inpatient Diabetes Program Recommendations  AACE/ADA: New Consensus Statement on Inpatient Glycemic Control (2015)  Target Ranges:  Prepandial:   less than 140 mg/dL      Peak postprandial:   less than 180 mg/dL (1-2 hours)      Critically ill patients:  140 - 180 mg/dL   Lab Results  Component Value Date   GLUCAP 194 (H) 02/21/2020   HGBA1C 9.2 (H) 02/19/2020    Review of Glycemic Control Results for NAHMIR, ZEIDMAN (MRN [id number]) as of 02/21/2020 10:53  Ref. Range 02/20/2020 07:38 02/20/2020 11:30 02/20/2020 16:34 02/20/2020 21:05 02/21/2020 07:29  Glucose-Capillary Latest Ref Range: 70 - 99 mg/dL 156 (H) 195 (H) 265 (H) 234 (H) 194 (H)   Diabetes history: DM 2 Outpatient Diabetes medications: Levemir 30-40 units every night Current orders for Inpatient glycemic control:  Levemir 10 units Daily Novolog 0-6 units tid  A1c 9.2%  Inpatient Diabetes Program Recommendations:    -May consider Novolog 2 units tid meal coverage as glucose trends increase after meal  Intake.  Spoke with pt over the phone regarding A1c level and glucose control at home. Pt reports taking Levemir 30-40 units every night and glucose trends are in the 200 range when he checks 2-3 times a day/ Pt reports A1c being around a 7% around 3 months ago and no changes were made to his insulin at that time when he saw his PCP. Discussed current A1c level, 9.2%. discussed A1c goals. Pt reports PCP trying to get tighter control int he past, however, caused hypoglycemia episodes.  Discussed with pt that if intervention is done with his pain with an injection he will need to check glucose frequently and notify his PCP for insulin adjustments for a few days until effects of medication wear off and then he may need to go back down to his baseline dose.  Thanks,  Tama Headings RN, MSN, BC-ADM Inpatient Diabetes Coordinator Team Pager [phone] (8a-5p)

## 2020-02-21 NOTE — Progress Notes (Signed)
Subjective: The patient feels that his right hip symptoms remain manageable, but continue to be painful, especially with weightbearing.  He denies any new complaints pertaining to his right hip or lower extremity.   Objective: Vital signs in last 24 hours: Temp:  [98 F (36.7 C)-98.5 F (36.9 C)] 98.5 F (36.9 C) (09/20 0725) Pulse Rate:  [57-66] 65 (09/20 0725) Resp:  [15-18] 18 (09/20 0725) BP: (115-138)/(53-71) 138/68 (09/20 0725) SpO2:  [97 %-100 %] 100 % (09/20 0725)  Intake/Output from previous day: 09/19 0701 - 09/20 0700 In: 360 [P.O.:360] Out: 1050 [Urine:1050] Intake/Output this shift: No intake/output data recorded.  Recent Labs    02/18/20 1346 02/19/20 0420 02/20/20 0412 02/21/20 0543  HGB 12.3* 10.7* 10.5* 10.6*   Recent Labs    02/20/20 0412 02/21/20 0543  WBC 5.2 4.5  RBC 3.59* 3.62*  HCT 31.8* 32.0*  PLT 141* 140*   Recent Labs    02/20/20 0412 02/21/20 0543  NA 139 137  K 3.5 3.5  CL 103 101  CO2 27 25  BUN 83* 84*  CREATININE 2.72* 2.93*  GLUCOSE 188* 195*  CALCIUM 8.6* 8.7*   No results for input(s): LABPT, INR in the last 72 hours.  Physical Exam: Overall, the patient's physical examination findings are unchanged as compared to yesterday's examination findings.  His hip can be flexed to near 90 degrees before he experiences pain.  He remains neurovascularly intact to the right lower extremity and foot.  Assessment: Degenerative joint disease right hip.  Plan: The treatment options have been reviewed with the patient.  The patient remains adamant about avoiding surgical intervention if at all possible.  He would like to try a steroid injection into the right hip joint.  Therefore, I will arrange for interventional radiology to perform this procedure under ultrasound or fluoroscopic guidance.  Meanwhile, the patient may continue to be mobilized with physical therapy, progressing as symptoms permit.   Phillip [name] 02/21/2020, 1:13 PM

## 2020-02-21 NOTE — Telephone Encounter (Signed)
Pt had cancelled his follow up appt with Dr. Haydee Salter 9/14 and 9/16.Marland Kitchen will forward to his nurse for review and pt to call back after he is discharged to reschedule his follow up appt.

## 2020-02-21 NOTE — Progress Notes (Signed)
PROGRESS NOTE    Patient: Phillip [name]                            PCP: Raina Mina., MD                    DOB: Mar 21, 1956            DOA: 02/18/2020 DXA:[id number]             DOS: 02/21/2020, 11:33 AM   LOS: 2 days   Date of Service: The patient was seen and examined on 02/21/2020  Subjective:   The patient was seen and examined this morning, stable. Still reporting of hip pain but has improved from admission Discussing with orthopedic regarding intra-articular steroid injection  Reports the patient worsening kidney function Discussed with his nephrologist at [facility] Dr. Neta Ehlers    Brief Narrative:   HPI: Phillip [name] is a 64 y.o. male w PMH/o hronic benign systolic and diastolic CHF (EF 20-94%, G3 DD by TTE 05/12/2019), CKD stage IV, CAD s/p CABG, PVD, IDT2DM, HTN, HLD,HPT,  anemia of chronic disease, and hypothyroidism who is admitted with AKI on CKD stage IV, and  right groin/hip pain.  ED Course:  Initial vitals showed BP 173/77, pulse 77, RR 19, temp 98.8 Fahrenheit, SPO2 99% on room air.  Labs show BUN 91, creatinine 3.55 (last creatinine 2.77 on 12/30/2019), GFR 20 (previously 27), sodium 139, potassium 3.7, bicarb 26, LFTs within normal limits, serum glucose 272, WBC 7.3, hemoglobin 12.3, platelets 144,000.  SARS-CoV-2 PCR is obtained and pending.  Right hip x-ray was negative for fracture or acute finding.  Mild chronic appearing bony productive changes along the inferior right acetabulum were seen.  CT pelvis without contrast was negative for acute fracture or traumatic osseous injuries or suspicious osseous lesions.  Unchanged grade 1 anterolisthesis L5 on S1 with associated bilateral L5 pars defects was seen and unchanged from prior.  Moderate right and mild left hip osteoarthritis were seen, progressive from prior imaging.  Discogenic and facet degenerative changes at L4-L5 and L5-S1 with severe bilateral foraminal narrowing L5-S1 were seen,  right greater than left, with compression on existing nerve roots.  Moderate bladder distention, diverticulosis without evidence of diverticulitis, prior appendectomy, and aortic atherosclerosis are also noted.  Patient was given 500 cc LR, 500 mg oral Robaxin, and 1000 mg Tylenol.  The hospitalist service was consulted to admit for further evaluation and management.   Assessment & Plan:   Principal Problem:   Acute kidney injury superimposed on CKD ([place]) Active Problems:   CAD (coronary artery disease)   Hypothyroidism   Chronic combined systolic and diastolic CHF (congestive heart failure) (HCC)   Anemia of chronic disease   Hypertension associated with diabetes ([place])   Type 2 diabetes mellitus with stage 4 chronic kidney disease, with long-term current use of insulin (HCC)   Hyperlipidemia associated with type 2 diabetes mellitus (HCC)   Right groin pain   ARF (acute renal failure) (HCC)    AKI on CKD stage IV: - Cr.  3.55, 3.2  >>> 2.72 >> 2.93  this a.m. (baseline creatinine around 2.42 on 10/22/2019) -Suspect prerenal as patient appears volume depleted on admission in setting of home diuretics. -Hold home Bumex, lisinopril -Discussed with his primary nephrologist --aware of the current findings, recommending slowly restarting his diuretics as he has have easily been volume overloaded in the past due  to his heart failure -Urine sodium 99, creatinine 70, --Status post gentle IV fluid hydration t -Monitor strict I/O's and daily weights -Appreciate nephrology follow-up and recommendations  Right groin/hip pain with associated weakness and difficulty ambulating: -Orthopedic team consulted -patient being monitored closely Ortho believe that pain may be referred pain from mainly spine, nerve compression narrowing foramens Versus arthritic changes--- recommending PT OT, no surgical intervention, anticipating possible intracuticular steroid injection  -Discussed possible  intra-articular steroid injection today  -MRI of the hip lumbar spine-reviewed-review of revealing  Bilateral L5 pars interarticularis defects with grade 1.  anterolisthesis and severe bilateral neural foraminal stenosis CT pelvis without evidence of fracture, traumatic osseous injuries, or suspicious osseous lesions.  Severe bilateral foraminal narrowing L5-S1 seen on CT imaging.  -Continue Tylenol as needed for pain -Continue home gabapentin at reduced dose due to renal dysfunction -Request PT/OT eval  Chronic combined systolic and diastolic CHF: -We will continue to monitor closely, no signs of exacerbation EF 30-35%, G3 DD by TTE 05/12/2019.  Appears volume depleted on admission as above. BNP 406.2 -Holding home Bumex and lisinopril -Continue carvedilol and Imdur -Continue gentle IV fluid hydration overnight -Strict I/O's and daily weights  Insulin-dependent type 2 diabetes: Uncontrolled -CBGs 265, 234, 194 this a.m. -Checking his blood sugar QA CHS, with SSI coverage --CBGs 209, 264, 156 this a.m. Hyperglycemic on admission.   Continue home increasing Levemir 10 >>15 units nightly a  sensitive SSI while in hospital.   -A1c.  9.2  CAD s/p CABG/ PVD: Stable without chest pain.  Continue aspirin, Plavix, Coreg Imdur, statin.  Hypertension: Stable Continue home Coreg, clonidine, Imdur.  Holding lisinopril in setting of AKI.  Hyperlipidemia: Continue statin.  Anemia of chronic renal disease: Chronic-stable Continue monitor.  Hypothyroidism: Continue Synthroid. Remained stable   DVT prophylaxis: Subcutaneous heparin Code Status: Full code, confirmed with patient Family Communication: Discussed with patient, he has discussed with family Disposition Plan: From home, dispo pending PT/OT eval Consults called: None Admission status:     Status is: Inpatient  Remain inpatient, continued benefit from consultants input, IV fluid, for acute on chronic renal  failure, continued pain difficulty ambulation  Dispo: The patient is from: Home  Anticipated d/c is to: Home vs SNF pending PT/OT eval  Anticipated d/c date is: 1 day  Patient currently is not medically stable to d/c.       Consultants: Neohro. Orhto    --------------------------------------------------------------------------------------------------------------------------------------------     Procedures:   No admission procedures for hospital encounter.     Antimicrobials:  Anti-infectives (From admission, onward)   None       Medication:  . aspirin EC  81 mg Oral Daily  . carvedilol  6.25 mg Oral BID WC  . cloNIDine  0.3 mg Oral BID  . clopidogrel  75 mg Oral Daily  . gabapentin  300 mg Oral BID  . heparin  5,000 Units Subcutaneous Q8H  . insulin aspart  0-6 Units Subcutaneous TID WC  . insulin detemir  10 Units Subcutaneous Daily  . isosorbide mononitrate  120 mg Oral Daily  . levothyroxine  25 mcg Oral Daily  . pravastatin  20 mg Oral q1800  . sodium chloride flush  3 mL Intravenous Q12H  . tamsulosin  0.4 mg Oral Daily    acetaminophen **OR** acetaminophen, ondansetron **OR** ondansetron (ZOFRAN) IV   Objective:   Vitals:   02/20/20 1524 02/20/20 2034 02/21/20 0024 02/21/20 0725  BP: (!) 115/53 131/62 125/71 138/68  Pulse: (!) 57  66 65  Resp: 16  15 18   Temp: 98.1 F (36.7 C)  98 F (36.7 C) 98.5 F (36.9 C)  TempSrc: Oral  Oral Oral  SpO2: 97%  100% 100%  Weight:      Height:        Intake/Output Summary (Last 24 hours) at 02/21/2020 1133 Last data filed at 02/20/2020 1940 Gross per 24 hour  Intake 120 ml  Output 500 ml  Net -380 ml   Filed Weights   02/18/20 1331 02/20/20 0615  Weight: 99.3 kg 96.2 kg     Examination:       Physical Exam:   General:  Alert, oriented, cooperative, no distress;   HEENT:  Normocephalic, PERRL, otherwise with in Normal limits   Neuro:  CNII-XII  intact. , normal motor and sensation, reflexes intact   Lungs:   Clear to auscultation BL, Respirations unlabored, no wheezes / crackles  Cardio:    S1/S2, RRR, No murmure, No Rubs or Gallops   Abdomen:   Soft, non-tender, bowel sounds active all four quadrants,  no guarding or peritoneal signs.  Muscular skeletal:   Bilateral hip pain, right greater than left  Limited exam - in bed, able to move all 4 extremities, Normal strength,  2+ pulses,  symmetric, No pitting edema  Skin:  Dry, warm to touch, negative for any Rashes, No open wounds  Wounds: Please see nursing documentation           ------------------------------------------------------------------------------------------------------------------------------------------    LABs:  CBC Latest Ref Rng & Units 02/21/2020 02/20/2020 02/19/2020  WBC 4.0 - 10.5 K/uL 4.5 5.2 7.1  Hemoglobin 13.0 - 17.0 g/dL 10.6(L) 10.5(L) 10.7(L)  Hematocrit 39 - 52 % 32.0(L) 31.8(L) 32.6(L)  Platelets 150 - 400 K/uL 140(L) 141(L) 139(L)   CMP Latest Ref Rng & Units 02/21/2020 02/20/2020 02/19/2020  Glucose 70 - 99 mg/dL 195(H) 188(H) 327(H)  BUN 8 - 23 mg/dL 84(H) 83(H) 88(H)  Creatinine 0.61 - 1.24 mg/dL 2.93(H) 2.72(H) 3.20(H)  Sodium 135 - 145 mmol/L 137 139 139  Potassium 3.5 - 5.1 mmol/L 3.5 3.5 3.4(L)  Chloride 98 - 111 mmol/L 101 103 101  CO2 22 - 32 mmol/L 25 27 27   Calcium 8.9 - 10.3 mg/dL 8.7(L) 8.6(L) 8.7(L)  Total Protein 6.5 - 8.1 g/dL - - -  Total Bilirubin 0.3 - 1.2 mg/dL - - -  Alkaline Phos 38 - 126 U/L - - -  AST 15 - 41 U/L - - -  ALT 0 - 44 U/L - - -       Micro Results Recent Results (from the past 240 hour(s))  SARS Coronavirus 2 by RT PCR (hospital order, performed in [facility] hospital lab) Nasopharyngeal Nasopharyngeal Swab     Status: None   Collection Time: 02/18/20 10:38 PM   Specimen: Nasopharyngeal Swab  Result Value Ref Range Status   SARS Coronavirus 2 NEGATIVE NEGATIVE Final    Comment:  (NOTE) SARS-CoV-2 target nucleic acids are NOT DETECTED.  The SARS-CoV-2 RNA is generally detectable in upper and lower respiratory specimens during the acute phase of infection. The lowest concentration of SARS-CoV-2 viral copies this assay can detect is 250 copies / mL. A negative result does not preclude SARS-CoV-2 infection and should not be used as the sole basis for treatment or other patient management decisions.  A negative result may occur with improper specimen collection / handling, submission of specimen other than nasopharyngeal swab, presence of viral mutation(s) within the areas targeted  by this assay, and inadequate number of viral copies (<250 copies / mL). A negative result must be combined with clinical observations, patient history, and epidemiological information.  Fact Sheet for Patients:   StrictlyIdeas.no  Fact Sheet for Healthcare Providers: BankingDealers.co.za  This test is not yet approved or  cleared by the [place] FDA and has been authorized for detection and/or diagnosis of SARS-CoV-2 by FDA under an Emergency Use Authorization (EUA).  This EUA will remain in effect (meaning this test can be used) for the duration of the COVID-19 declaration under Section 564(b)(1) of the Act, 21 U.S.C. section 360bbb-3(b)(1), unless the authorization is terminated or revoked sooner.  Performed at [facility], [address] [place], [place] [address]     Radiology Reports CT PELVIS WO CONTRAST  Result Date: 02/18/2020 CLINICAL DATA:  Pelvic pain, stress fracture suspected, severe right hip pain and sciatica. Groin pain radiating to the leg, unable to bear weight since Tuesday EXAM: CT PELVIS WITHOUT CONTRAST TECHNIQUE: Multidetector CT imaging of the pelvis was performed following the standard protocol without intravenous contrast. COMPARISON:  CT abdomen pelvis 10/05/2018, hip radiograph 02/18/2020  FINDINGS: Urinary Tract: Bladder distention with indentation of the bladder base by an enlarged prostate. No bladder wall thickening, visible calculi or debris. Distal ureters are unremarkable. Bowel: No abnormal bowel wall thickening, dilatation or evidence of obstruction. Scattered colonic diverticula without focal inflammation to suggest diverticulitis. The appendix is surgically absent. Vascular/Lymphatic: Aortoiliac atherosclerosis with additional calcifications in the included proximal outflow vasculature as well. Luminal evaluation precluded in the absence of contrast media. No suspicious or enlarged lymph nodes in the included lymphatic chains. Reproductive: Mild prostatomegaly with slight indentation of the bladder base by some median lobe hypertrophy. Seminal vesicles are unremarkable. Other: Mild body wall edema. No bowel containing hernia. Small fat containing inguinal hernias. No free pelvic air or fluid. Musculoskeletal: Redemonstration of 8 mm of grade 1 anterolisthesis L5 on S1 with associated bilateral L5 pars defects which appear well corticated, chronic and unchanged from comparison study. Slight retrolisthesis of L4 on L5 is also unchanged from comparison. Discogenic and facet degenerative changes are present at the included L4-S1 levels. Mild canal stenosis and moderate bilateral foraminal narrowing L4-L5 and severe bilateral foraminal narrowing L5-S1, right greater than left with compression of the exiting nerve root (7/89 which is similar to prior but could present is a pain generator in this patient. The sacrum is intact. No sacral insufficiency fractures are identified. Partial ankylosis along superior margins of the bilateral SI joints with mild underlying sacroiliac arthrosis unchanged from comparison. Mild angulation at the sacrococcygeal junction is chronic and unchanged from prior. There is moderate right and mild left hip osteoarthrosis including periacetabular spurring, subchondral  sclerosis and cyst formation. Femoral heads however remain normally located in the proximal femora and cells are intact. These features are progressive from comparison imaging. IMPRESSION: 1. No acute fracture or traumatic osseous injuries. No suspicious osseous lesions. 2. Unchanged 8 mm of grade 1 anterolisthesis L5 on S1 with associated bilateral L5 pars defects which appear well corticated, chronic and unchanged from comparison study. 3. Moderate right and mild left hip osteoarthrosis, progressive from comparison imaging. 4. Discogenic and facet degenerative changes at L4-L5 and L5-S1 with severe bilateral foraminal narrowing L5-S1, right greater than left, with resulting compression of the exiting nerve roots. 5. Moderate bladder distention with indentation of the bladder base by an enlarged prostate. Correlate for symptoms of outlet obstruction. 6. Diverticulosis without evidence of diverticulitis.  7. Prior appendectomy. 8. Aortic Atherosclerosis (ICD10-I70.0). Electronically Signed   By: Lovena Le M.D.   On: 02/18/2020 22:15   MR LUMBAR SPINE WO CONTRAST  Result Date: 02/19/2020 CLINICAL DATA:  Low back pain radiating to the right lower extremity EXAM: MRI LUMBAR SPINE WITHOUT CONTRAST TECHNIQUE: Multiplanar, multisequence MR imaging of the lumbar spine was performed. No intravenous contrast was administered. COMPARISON:  None. FINDINGS: Segmentation:  Standard. Alignment: Grade 1 retrolisthesis at L4-5 and grade 1 anterolisthesis at L5-S1 Vertebrae:  There are bilateral L5 pars interarticularis defects Conus medullaris and cauda equina: Conus extends to the L1 level. Conus and cauda equina appear normal. Paraspinal and other soft tissues: Markedly distended urinary bladder. Disc levels: L1-L2: Normal disc space and facet joints. There is no spinal canal stenosis. No neural foraminal stenosis. L2-L3: Normal disc space and facet joints. There is no spinal canal stenosis. No neural foraminal stenosis.  L3-L4: Normal disc space and facet joints. There is no spinal canal stenosis. No neural foraminal stenosis. L4-L5: Mild facet hypertrophy with fluid in the facet joints. Small disc bulge. There is no spinal canal stenosis. Mild bilateral neural foraminal stenosis. L5-S1: Disc space narrowing and partial uncovering. There is no spinal canal stenosis. Severe bilateral neural foraminal stenosis. Visualized sacrum: Normal. IMPRESSION: 1. Bilateral L5 pars interarticularis defects with grade 1 anterolisthesis and severe bilateral neural foraminal stenosis. 2. Mild bilateral L4-5 neural foraminal stenosis. 3. Markedly distended urinary bladder. Electronically Signed   By: Ulyses Jarred M.D.   On: 02/19/2020 00:00   US RENAL  Result Date: 02/20/2020 CLINICAL DATA:  Acute renal insufficiency. EXAM: RENAL / URINARY TRACT ULTRASOUND COMPLETE COMPARISON:  None. FINDINGS: Right Kidney: Renal measurements: 11.7 cm x 4.5 cm x 5.8 cm = volume: 161.7 mL. There is diffusely increased echogenicity of the renal parenchyma. A 1.1 cm x 1.2 cm x 1.4 cm anechoic structure is seen within the lower pole of the right kidney. This demonstrates no flow on color Doppler evaluation. No associated soft tissue component is seen. No hydronephrosis visualized. Left Kidney: Renal measurements: 11.7 cm x 6.8 cm x 6.1 cm = volume: 254.7 mL. There is diffusely increased echogenicity of the renal parenchyma. No mass or hydronephrosis visualized. Bladder: Appears normal for degree of bladder distention. Other: None. IMPRESSION: 1. Increased echogenicity of the renal parenchyma, likely secondary to medical renal disease. 2. Right renal cyst. Electronically Signed   By: Virgina Norfolk M.D.   On: 02/20/2020 15:06   MR HIP RIGHT WO CONTRAST  Result Date: 02/19/2020 CLINICAL DATA:  Right hip and groin pain. EXAM: MR OF THE RIGHT HIP WITHOUT CONTRAST TECHNIQUE: Multiplanar, multisequence MR imaging was performed. No intravenous contrast was  administered. COMPARISON:  None. FINDINGS: Bones: No hip fracture, dislocation or avascular necrosis. No periosteal reaction or bone destruction. No aggressive osseous lesion. Normal sacrum and sacroiliac joints. No SI joint widening or erosive changes. Degenerative disease with disc height loss and facet arthropathy at L4-5 and L5-S1. Articular cartilage and labrum Articular cartilage: High-grade partial-thickness cartilage loss of the right femoral head and acetabulum with subchondral marrow edema in the acetabulum. Labrum: Right labral degeneration with a tear of the superior and anterior labrum. Joint or bursal effusion Joint effusion: Small right hip joint effusion. No left hip joint effusion. No SI joint effusion. Bursae:  No bursa formation. Muscles and tendons Flexors: Normal. Extensors: Mild muscle edema in the vastus intermedius muscle proximally likely reflecting muscle strain. Remainder of the muscles are normal. Abductors: Normal. Adductors:  Normal. Gluteals: Normal. Hamstrings: Normal. Other findings No pelvic free fluid. No fluid collection or hematoma. No inguinal lymphadenopathy. No inguinal hernia. Distended bladder. IMPRESSION: 1. No hip fracture, dislocation or avascular necrosis. 2. Moderate-severe osteoarthritis of the right hip. 3. Right labral degeneration with a tear of the superior and anterior labrum. Electronically Signed   By: Kathreen Devoid   On: 02/19/2020 13:03   DG Hip Unilat W or Wo Pelvis 2-3 Views Right  Result Date: 02/18/2020 CLINICAL DATA:  Right hip and groin pain for the last 4 days. Pain with movement. No injury. EXAM: DG HIP (WITH OR WITHOUT PELVIS) 2-3V RIGHT COMPARISON:  None. FINDINGS: No fracture or bone lesion. Hip joints are normally spaced and aligned. On the right, there is bony productive change along the inferior margin of the acetabulum. SI joints and symphysis pubis are normally spaced and aligned. Arterial atherosclerotic calcifications are noted along the  iliac and femoral vessels. Soft tissues are otherwise unremarkable. IMPRESSION: 1. No fracture or acute finding. 2. Mild chronic appearing bony productive changes along the inferior right acetabulum. No other degenerative/arthropathic changes. Electronically Signed   By: Lajean Manes M.D.   On: 02/18/2020 14:21    SIGNED: Deatra James, MD, FACP, FHM. [facility],  Pager (please use [email or web address] to page/text)  If 7PM-7AM, please contact night-coverage [email or web address] Ota [facility] 02/21/2020, 11:32 AM

## 2020-02-21 NOTE — Progress Notes (Addendum)
[facility]  ROUNDING NOTE   Subjective:   Patient found sitting in a chair, in no acute distress. He still has c/o right hip pain, no other complaints.   Objective:  Vital signs in last 24 hours:  Temp:  [98 F (36.7 C)-98.5 F (36.9 C)] 98.5 F (36.9 C) (09/20 0725) Pulse Rate:  [57-66] 65 (09/20 0725) Resp:  [15-18] 18 (09/20 0725) BP: (115-138)/(53-71) 138/68 (09/20 0725) SpO2:  [97 %-100 %] 100 % (09/20 0725)  Weight change:  Filed Weights   02/18/20 1331 02/20/20 0615  Weight: 99.3 kg 96.2 kg    Intake/Output: I/O last 3 completed shifts: In: 360 [P.O.:360] Out: 1450 [Urine:1450]   Intake/Output this shift:  No intake/output data recorded.  Physical Exam: General: Sitting in chair, appears comfortable  Head: Normocephalic, atraumatic. Moist oral mucosal membranes  Eyes: Sclerae and conjunctivae clear  Neck: Trachea at the midline  Lungs:  Bilaterally clear,Respiration even,unlabored  Heart: S1S2,Regular  Abdomen:  Non distended, non tender  Extremities: No peripheral edema.  Neurologic: Alert, oriented x3  Skin: No rashes or lesions       Basic Metabolic Panel: Recent Labs  Lab 02/18/20 1346 02/18/20 1346 02/19/20 0420 02/20/20 0412 02/21/20 0543  NA 139  --  139 139 137  K 3.7  --  3.4* 3.5 3.5  CL 100  --  101 103 101  CO2 26  --  27 27 25   GLUCOSE 272*  --  327* 188* 195*  BUN 91*  --  88* 83* 84*  CREATININE 3.55*  --  3.20* 2.72* 2.93*  CALCIUM 8.7*   < > 8.7* 8.6* 8.7*   < > = values in this interval not displayed.    Liver Function Tests: Recent Labs  Lab 02/18/20 1346  AST 16  ALT 12  ALKPHOS 77  BILITOT 0.8  PROT 8.3*  ALBUMIN 3.9   No results for input(s): LIPASE, AMYLASE in the last 168 hours. No results for input(s): AMMONIA in the last 168 hours.  CBC: Recent Labs  Lab 02/18/20 1346 02/19/20 0420 02/20/20 0412 02/21/20 0543  WBC 7.3 7.1 5.2 4.5  HGB 12.3* 10.7* 10.5* 10.6*  HCT 36.2* 32.6* 31.8*  32.0*  MCV 86.2 88.3 88.6 88.4  PLT 154 139* 141* 140*    Cardiac Enzymes: No results for input(s): CKTOTAL, CKMB, CKMBINDEX, TROPONINI in the last 168 hours.  BNP: Invalid input(s): POCBNP  CBG: Recent Labs  Lab 02/20/20 1130 02/20/20 1634 02/20/20 2105 02/21/20 0729 02/21/20 1141  GLUCAP 195* 265* 234* 194* 362*    Microbiology: Results for orders placed or performed during the hospital encounter of 02/18/20  SARS Coronavirus 2 by RT PCR (hospital order, performed in [facility] hospital lab) Nasopharyngeal Nasopharyngeal Swab     Status: None   Collection Time: 02/18/20 10:38 PM   Specimen: Nasopharyngeal Swab  Result Value Ref Range Status   SARS Coronavirus 2 NEGATIVE NEGATIVE Final    Comment: (NOTE) SARS-CoV-2 target nucleic acids are NOT DETECTED.  The SARS-CoV-2 RNA is generally detectable in upper and lower respiratory specimens during the acute phase of infection. The lowest concentration of SARS-CoV-2 viral copies this assay can detect is 250 copies / mL. A negative result does not preclude SARS-CoV-2 infection and should not be used as the sole basis for treatment or other patient management decisions.  A negative result may occur with improper specimen collection / handling, submission of specimen other than nasopharyngeal swab, presence of viral mutation(s) within  the areas targeted by this assay, and inadequate number of viral copies (<250 copies / mL). A negative result must be combined with clinical observations, patient history, and epidemiological information.  Fact Sheet for Patients:   StrictlyIdeas.no  Fact Sheet for Healthcare Providers: BankingDealers.co.za  This test is not yet approved or  cleared by the [place] FDA and has been authorized for detection and/or diagnosis of SARS-CoV-2 by FDA under an Emergency Use Authorization (EUA).  This EUA will remain in effect (meaning this test  can be used) for the duration of the COVID-19 declaration under Section 564(b)(1) of the Act, 21 U.S.C. section 360bbb-3(b)(1), unless the authorization is terminated or revoked sooner.  Performed at [facility], [address] [place], [place] [address]     Coagulation Studies: No results for input(s): LABPROT, INR in the last 72 hours.  Urinalysis: Recent Labs    02/19/20 0130  COLORURINE YELLOW*  LABSPEC 1.012  PHURINE 5.0  GLUCOSEU 50*  HGBUR SMALL*  BILIRUBINUR NEGATIVE  KETONESUR NEGATIVE  PROTEINUR 100*  NITRITE NEGATIVE  LEUKOCYTESUR NEGATIVE      Imaging: No results found.   Medications:      aspirin EC  81 mg Oral Daily   carvedilol  6.25 mg Oral BID WC   cloNIDine  0.3 mg Oral BID   clopidogrel  75 mg Oral Daily   gabapentin  300 mg Oral BID   heparin  5,000 Units Subcutaneous Q8H   insulin aspart  0-6 Units Subcutaneous TID WC   insulin detemir  15 Units Subcutaneous Daily   isosorbide mononitrate  120 mg Oral Daily   levothyroxine  25 mcg Oral Daily   pravastatin  20 mg Oral q1800   sodium chloride flush  3 mL Intravenous Q12H   tamsulosin  0.4 mg Oral Daily   acetaminophen **OR** acetaminophen, ondansetron **OR** ondansetron (ZOFRAN) IV  Assessment/ Plan:  Phillip [name] is a 64 y.o. black male with diabetes mellitus type II, diabetic neuropathy, hypertension, coronary artery disease status post CABG, peripheral vascular disease, status post left toe amputation, diastolic congestive heart failure, COPD, who was admitted to [facility] on 02/18/2020 for ARF (acute renal failure) ([place]) [N17.9] Right leg weakness [R29.898] Sciatica of right side [M54.31] AKI (acute kidney injury) ([place]) [N17.9] Acute kidney injury superimposed on CKD ([place]) [N17.9, N18.9]  #Acute renal failure   Baseline creatinine of 2.77 on 12/30/19 Creatinine today is 2.93 Acute renal failure could be due to obstructive uropathy - Started Flomax yesterday - Holding  bumetanide, lisinopril and metolazone  #Hypertension:  BP readings within acceptable range Continue Clonidine,Imdur and carvedilol Lisinopril on hold  # Diabetes mellitus type II with chronic kidney disease:   Hemoglobin A1c of 9.2%. Not well controlled,on Insulin Levemir and sliding scale  # Anemia with renal failure Hemoglobin today is 10.6, at goal Will continue monitoring CBCs    LOS: 2 Princy Raju 9/20/202112:11 PM  Patient was seen and examined with Crosby Oyster, DNP. Above plan was discussed and agreed upon on the signing of this note.   Lavonia Dana, MD [facility]  9/20/20213:20 PM

## 2020-02-21 NOTE — Telephone Encounter (Signed)
Patient wanted to advise Dr. Geraldo Pitter that he is currently hospitalized at [facility] for right leg pain. He states he was told to f/u with cardiology after he was discharged and will call back then to schedule.

## 2020-02-22 LAB — GLUCOSE, CAPILLARY
Glucose-Capillary: 294 mg/dL — ABNORMAL HIGH (ref 70–99)
Glucose-Capillary: 318 mg/dL — ABNORMAL HIGH (ref 70–99)

## 2020-02-22 LAB — BASIC METABOLIC PANEL
Anion gap: 11 (ref 5–15)
BUN: 87 mg/dL — ABNORMAL HIGH (ref 8–23)
CO2: 23 mmol/L (ref 22–32)
Calcium: 9.1 mg/dL (ref 8.9–10.3)
Chloride: 102 mmol/L (ref 98–111)
Creatinine, Ser: 2.72 mg/dL — ABNORMAL HIGH (ref 0.61–1.24)
GFR calc Af Amer: 27 mL/min — ABNORMAL LOW (ref >60)
GFR calc non Af Amer: 24 mL/min — ABNORMAL LOW (ref >60)
Glucose, Bld: 291 mg/dL — ABNORMAL HIGH (ref 70–99)
Potassium: 4.2 mmol/L (ref 3.5–5.1)
Sodium: 136 mmol/L (ref 135–145)

## 2020-02-22 LAB — CBC
HCT: 34.1 % — ABNORMAL LOW (ref 39.0–52.0)
Hemoglobin: 11.5 g/dL — ABNORMAL LOW (ref 13.0–17.0)
MCH: 29 pg (ref 26.0–34.0)
MCHC: 33.7 g/dL (ref 30.0–36.0)
MCV: 86.1 fL (ref 80.0–100.0)
Platelets: 161 10*3/uL (ref 150–400)
RBC: 3.96 MIL/uL — ABNORMAL LOW (ref 4.22–5.81)
RDW: 13 % (ref 11.5–15.5)
WBC: 6.1 10*3/uL (ref 4.0–10.5)
nRBC: 0 % (ref 0.0–0.2)

## 2020-02-22 MED ORDER — GABAPENTIN 600 MG PO TABS: 300.0000 mg | ORAL_TABLET | Freq: Two times a day (BID) | ORAL | 1 refills | Status: DC

## 2020-02-22 MED ORDER — TAMSULOSIN HCL 0.4 MG PO CAPS: 0.4000 mg | ORAL_CAPSULE | Freq: Every day | ORAL | 3 refills | Status: AC

## 2020-02-22 MED ORDER — CARVEDILOL 6.25 MG PO TABS: 12.5000 mg | ORAL_TABLET | Freq: Two times a day (BID) | ORAL | 1 refills | Status: DC

## 2020-02-22 MED ORDER — INSULIN ASPART 100 UNIT/ML ~~LOC~~ SOLN: 0.0000 [IU] | Freq: Three times a day (TID) | SUBCUTANEOUS | 11 refills | Status: DC

## 2020-02-22 MED ORDER — LEVEMIR FLEXTOUCH 100 UNIT/ML ~~LOC~~ SOPN: 20.0000 [IU] | PEN_INJECTOR | Freq: Every day | SUBCUTANEOUS | 11 refills | Status: DC

## 2020-02-22 NOTE — Discharge Summary (Signed)
Physician Discharge Summary [facility]    Patient: Phillip [name]                   Admit date: 02/18/2020   DOB: 03-22-1956             Discharge date:02/22/2020/8:43 AM GYJ:[id number]                          PCP: Raina Mina., MD  Disposition: Home with home health  Recommendations for Outpatient Follow-up:   Follow up: in 1 week : Nephrologist, cardiologist, orthopedic  Discharge Condition: Stable   Code Status:   Code Status: Full Code  Diet recommendation: Cardiac diet   Discharge Diagnoses:    Principal Problem:   Acute kidney injury superimposed on CKD ([place]) Active Problems:   CAD (coronary artery disease)   Hypothyroidism   Chronic combined systolic and diastolic CHF (congestive heart failure) ([place])   Anemia of chronic disease   Hypertension associated with diabetes ([place])   Type 2 diabetes mellitus with stage 4 chronic kidney disease, with long-term current use of insulin ([place])   Hyperlipidemia associated with type 2 diabetes mellitus ([place])   Right groin pain   ARF (acute renal failure) ([place])   History of Present Illness/ Hospital Course Kathleen Argue Summary:     HPI: Tommie Bohlken Curtisis a 64 y.o.malew PMH/o hronic benign systolic and diastolic CHF (EF 26-37%, G3 DD by TTE 05/12/2019), CKD stage IV, CAD s/p CABG, PVD, IDT2DM, HTN, HLD,HPT,  anemia of chronic disease, and hypothyroidism who is admitted with AKI on CKD stage IV, and  right groin/hip pain.  ED Course: Initial vitals showed BP 173/77, pulse 77, RR 19, temp 98.8 Fahrenheit, SPO2 99% on room air.  Labs show BUN 91, creatinine 3.55 (last creatinine 2.77 on 12/30/2019), GFR 20 (previously 27), sodium 139, potassium 3.7, bicarb 26, LFTs within normal limits, serum glucose 272, WBC 7.3, hemoglobin 12.3, platelets 144,000.  SARS-CoV-2 PCR is obtained and pending.  Right hip x-ray was negative for fracture or acute finding. Mild chronic appearing bony productive changes along the  inferior right acetabulum were seen.  CT pelvis without contrast was negative for acute fracture or traumatic osseous injuries or suspicious osseous lesions. Unchanged grade 1 anterolisthesis L5 on S1 with associated bilateral L5 pars defects was seen and unchanged from prior. Moderate right and mild left hip osteoarthritis were seen, progressive from prior imaging. Discogenic and facet degenerative changes at L4-L5 and L5-S1 with severe bilateral foraminal narrowing L5-S1 were seen, right greater than left, with compression on existing nerve roots. Moderate bladder distention, diverticulosis without evidence of diverticulitis, prior appendectomy, and aortic atherosclerosis are also noted.  Patient was given 500 cc LR, 500 mg oral Robaxin, and 1000 mg Tylenol. The hospitalist service was consulted to admit for further evaluation and management.   Discharge summary, detail A&P      AKI on CKD stage IV: - Cr.  3.55, 3.2  >>> 2.72 >> 2.93 >> 2.72 today  -BUN 83, 84, 87 GFR 25, 27  this a.m. (baseline creatinine around 2.42 on 10/22/2019) -Suspect prerenal as patient appears volume depleted on admission in setting of home diuretics. -Hold home Bumex, lisinopril -Discussed with his primary nephrologist --aware of the current findings, recommending slowly restarting his diuretics as he has have easily been volume overloaded in the past due to his heart failure -Urine sodium 99, creatinine 70, --Status post gentle IV fluid hydration t -  Monitor strict I/O's and daily weights -Appreciate nephrology follow-up and recommendations  Right groin/hippain with associated weakness and difficulty ambulating: -Orthopedic team consulted -patient being monitored closely Ortho believe that pain may be referred pain from mainly spine, nerve compression narrowing foramens Versus arthritic changes--- recommending PT OT, no surgical intervention, anticipating possible intracuticular steroid  injection  -Discussed possible intra-articular steroid injection today  -MRI of the hip lumbar spine-reviewed-review of revealing  Bilateral L5 pars interarticularis defects with grade 1.  anterolisthesis and severe bilateral neural foraminal stenosis CT pelvis without evidence of fracture, traumatic osseous injuries, or suspicious osseous lesions. Severe bilateral foraminal narrowing L5-S1 seen on CT imaging.  -Continue Tylenol as needed for pain -Continue home gabapentin at reduced dose due to renal dysfunction -Request PT/OT eval  Chronic combined systolic and diastolic CHF: -We will continue to monitor closely, no signs of exacerbation EF 30-35%, G3 DD by TTE 05/12/2019. Appears volume depleted on admission as above. BNP 406.2 -Holding home Bumex and lisinopril -Continue carvedilol and Imdur -Continue gentle IV fluid hydration overnight -Strict I/O's and daily weights  Insulin-dependent type 2 diabetes: Uncontrolled -CBGs 265, 234, 194 this a.m. -Checking his blood sugar QA CHS, with SSI coverage --CBGs 209, 264, 156 this a.m. Hyperglycemic on admission.  Continue home increasing Levemir 10 >>15 units nightly a  sensitive SSI while in hospital.  -A1c.  9.2  CAD s/p CABG/ PVD: Stable without chest pain. Continue aspirin, Plavix, Coreg Imdur, statin.  Hypertension: Stable Continue home Coreg, clonidine, Imdur. Holding lisinopril in setting of AKI.  Hyperlipidemia: Continue statin.  Anemia of chronicrenaldisease: Chronic-stableContinue monitor.  Hypothyroidism: Continue Synthroid. Remained stable  Family Communication:Discussed with patient, he has discussed with family Disposition Plan:From home, Anticipated d/c is [id number] health    Nutritional status:          Discharge Instructions:   Discharge Instructions    Activity as tolerated - No restrictions   Complete by: As directed    Call MD for:  difficulty  breathing, headache or visual disturbances   Complete by: As directed    Call MD for:  redness, tenderness, or signs of infection (pain, swelling, redness, odor or green/yellow discharge around incision site)   Complete by: As directed    Call MD for:  temperature >100.4   Complete by: As directed    Diet - low sodium heart healthy   Complete by: As directed    Discharge instructions   Complete by: As directed    Please follow-up with your nephrologist ASAP, as your kidney function is compromised.  With holding 2 of your diuretics at this time please weigh yourself every day if increase in weight greater than 5 pounds in 24 hours notify your cardiologist and nephrologist immediately (including progressive edema and shortness of breath)  Follow-up with orthopedic team Continue physical therapy   Increase activity slowly   Complete by: As directed        Medication List    STOP taking these medications   lisinopril 40 MG tablet Commonly known as: ZESTRIL   metolazone 2.5 MG tablet Commonly known as: ZAROXOLYN     TAKE these medications   acetaminophen 500 MG tablet Commonly known as: TYLENOL Take 1,000 mg by mouth every 6 (six) hours as needed for moderate pain or headache.   aspirin 81 MG tablet Take 81 mg by mouth daily.   bumetanide 1 MG tablet Commonly known as: BUMEX Take 1 mg by mouth 2 (two) times daily.   carvedilol  6.25 MG tablet Commonly known as: COREG Take 2 tablets (12.5 mg total) by mouth 2 (two) times daily with a meal. What changed: how much to take   cloNIDine 0.3 MG tablet Commonly known as: CATAPRES Take 0.3 mg by mouth 2 (two) times daily.   clopidogrel 75 MG tablet Commonly known as: Plavix Take 1 tablet (75 mg total) by mouth daily.   gabapentin 600 MG tablet Commonly known as: NEURONTIN Take 0.5 tablets (300 mg total) by mouth 2 (two) times daily. What changed: how much to take   isosorbide mononitrate 120 MG 24 hr tablet Commonly known  as: IMDUR Take 120 mg by mouth daily.   Levemir FlexTouch 100 UNIT/ML FlexPen Generic drug: insulin detemir Inject 10-50 Units into the skin at bedtime.   levothyroxine 25 MCG tablet Commonly known as: SYNTHROID Take 25 mcg by mouth daily.   lovastatin 40 MG tablet Commonly known as: MEVACOR Take 0.5 tablets (20 mg total) by mouth at bedtime.   nitroGLYCERIN 0.4 MG SL tablet Commonly known as: NITROSTAT Place 0.4 mg under the tongue every 5 (five) minutes as needed for chest pain.   tamsulosin 0.4 MG Caps capsule Commonly known as: FLOMAX Take 1 capsule (0.4 mg total) by mouth daily.   vitamin B-12 500 MCG tablet Commonly known as: CYANOCOBALAMIN Take 500 mcg by mouth daily.       No Known Allergies   Procedures /Studies:   CT PELVIS WO CONTRAST  Result Date: 02/18/2020 CLINICAL DATA:  Pelvic pain, stress fracture suspected, severe right hip pain and sciatica. Groin pain radiating to the leg, unable to bear weight since Tuesday EXAM: CT PELVIS WITHOUT CONTRAST TECHNIQUE: Multidetector CT imaging of the pelvis was performed following the standard protocol without intravenous contrast. COMPARISON:  CT abdomen pelvis 10/05/2018, hip radiograph 02/18/2020 FINDINGS: Urinary Tract: Bladder distention with indentation of the bladder base by an enlarged prostate. No bladder wall thickening, visible calculi or debris. Distal ureters are unremarkable. Bowel: No abnormal bowel wall thickening, dilatation or evidence of obstruction. Scattered colonic diverticula without focal inflammation to suggest diverticulitis. The appendix is surgically absent. Vascular/Lymphatic: Aortoiliac atherosclerosis with additional calcifications in the included proximal outflow vasculature as well. Luminal evaluation precluded in the absence of contrast media. No suspicious or enlarged lymph nodes in the included lymphatic chains. Reproductive: Mild prostatomegaly with slight indentation of the bladder base by  some median lobe hypertrophy. Seminal vesicles are unremarkable. Other: Mild body wall edema. No bowel containing hernia. Small fat containing inguinal hernias. No free pelvic air or fluid. Musculoskeletal: Redemonstration of 8 mm of grade 1 anterolisthesis L5 on S1 with associated bilateral L5 pars defects which appear well corticated, chronic and unchanged from comparison study. Slight retrolisthesis of L4 on L5 is also unchanged from comparison. Discogenic and facet degenerative changes are present at the included L4-S1 levels. Mild canal stenosis and moderate bilateral foraminal narrowing L4-L5 and severe bilateral foraminal narrowing L5-S1, right greater than left with compression of the exiting nerve root (7/89 which is similar to prior but could present is a pain generator in this patient. The sacrum is intact. No sacral insufficiency fractures are identified. Partial ankylosis along superior margins of the bilateral SI joints with mild underlying sacroiliac arthrosis unchanged from comparison. Mild angulation at the sacrococcygeal junction is chronic and unchanged from prior. There is moderate right and mild left hip osteoarthrosis including periacetabular spurring, subchondral sclerosis and cyst formation. Femoral heads however remain normally located in the proximal femora and cells are  intact. These features are progressive from comparison imaging. IMPRESSION: 1. No acute fracture or traumatic osseous injuries. No suspicious osseous lesions. 2. Unchanged 8 mm of grade 1 anterolisthesis L5 on S1 with associated bilateral L5 pars defects which appear well corticated, chronic and unchanged from comparison study. 3. Moderate right and mild left hip osteoarthrosis, progressive from comparison imaging. 4. Discogenic and facet degenerative changes at L4-L5 and L5-S1 with severe bilateral foraminal narrowing L5-S1, right greater than left, with resulting compression of the exiting nerve roots. 5. Moderate bladder  distention with indentation of the bladder base by an enlarged prostate. Correlate for symptoms of outlet obstruction. 6. Diverticulosis without evidence of diverticulitis. 7. Prior appendectomy. 8. Aortic Atherosclerosis (ICD10-I70.0). Electronically Signed   By: Lovena Le M.D.   On: 02/18/2020 22:15   MR LUMBAR SPINE WO CONTRAST  Result Date: 02/19/2020 CLINICAL DATA:  Low back pain radiating to the right lower extremity EXAM: MRI LUMBAR SPINE WITHOUT CONTRAST TECHNIQUE: Multiplanar, multisequence MR imaging of the lumbar spine was performed. No intravenous contrast was administered. COMPARISON:  None. FINDINGS: Segmentation:  Standard. Alignment: Grade 1 retrolisthesis at L4-5 and grade 1 anterolisthesis at L5-S1 Vertebrae:  There are bilateral L5 pars interarticularis defects Conus medullaris and cauda equina: Conus extends to the L1 level. Conus and cauda equina appear normal. Paraspinal and other soft tissues: Markedly distended urinary bladder. Disc levels: L1-L2: Normal disc space and facet joints. There is no spinal canal stenosis. No neural foraminal stenosis. L2-L3: Normal disc space and facet joints. There is no spinal canal stenosis. No neural foraminal stenosis. L3-L4: Normal disc space and facet joints. There is no spinal canal stenosis. No neural foraminal stenosis. L4-L5: Mild facet hypertrophy with fluid in the facet joints. Small disc bulge. There is no spinal canal stenosis. Mild bilateral neural foraminal stenosis. L5-S1: Disc space narrowing and partial uncovering. There is no spinal canal stenosis. Severe bilateral neural foraminal stenosis. Visualized sacrum: Normal. IMPRESSION: 1. Bilateral L5 pars interarticularis defects with grade 1 anterolisthesis and severe bilateral neural foraminal stenosis. 2. Mild bilateral L4-5 neural foraminal stenosis. 3. Markedly distended urinary bladder. Electronically Signed   By: Ulyses Jarred M.D.   On: 02/19/2020 00:00   US RENAL  Result Date:  02/20/2020 CLINICAL DATA:  Acute renal insufficiency. EXAM: RENAL / URINARY TRACT ULTRASOUND COMPLETE COMPARISON:  None. FINDINGS: Right Kidney: Renal measurements: 11.7 cm x 4.5 cm x 5.8 cm = volume: 161.7 mL. There is diffusely increased echogenicity of the renal parenchyma. A 1.1 cm x 1.2 cm x 1.4 cm anechoic structure is seen within the lower pole of the right kidney. This demonstrates no flow on color Doppler evaluation. No associated soft tissue component is seen. No hydronephrosis visualized. Left Kidney: Renal measurements: 11.7 cm x 6.8 cm x 6.1 cm = volume: 254.7 mL. There is diffusely increased echogenicity of the renal parenchyma. No mass or hydronephrosis visualized. Bladder: Appears normal for degree of bladder distention. Other: None. IMPRESSION: 1. Increased echogenicity of the renal parenchyma, likely secondary to medical renal disease. 2. Right renal cyst. Electronically Signed   By: Virgina Norfolk M.D.   On: 02/20/2020 15:06   MR HIP RIGHT WO CONTRAST  Result Date: 02/19/2020 CLINICAL DATA:  Right hip and groin pain. EXAM: MR OF THE RIGHT HIP WITHOUT CONTRAST TECHNIQUE: Multiplanar, multisequence MR imaging was performed. No intravenous contrast was administered. COMPARISON:  None. FINDINGS: Bones: No hip fracture, dislocation or avascular necrosis. No periosteal reaction or bone destruction. No aggressive osseous lesion. Normal  sacrum and sacroiliac joints. No SI joint widening or erosive changes. Degenerative disease with disc height loss and facet arthropathy at L4-5 and L5-S1. Articular cartilage and labrum Articular cartilage: High-grade partial-thickness cartilage loss of the right femoral head and acetabulum with subchondral marrow edema in the acetabulum. Labrum: Right labral degeneration with a tear of the superior and anterior labrum. Joint or bursal effusion Joint effusion: Small right hip joint effusion. No left hip joint effusion. No SI joint effusion. Bursae:  No bursa  formation. Muscles and tendons Flexors: Normal. Extensors: Mild muscle edema in the vastus intermedius muscle proximally likely reflecting muscle strain. Remainder of the muscles are normal. Abductors: Normal. Adductors: Normal. Gluteals: Normal. Hamstrings: Normal. Other findings No pelvic free fluid. No fluid collection or hematoma. No inguinal lymphadenopathy. No inguinal hernia. Distended bladder. IMPRESSION: 1. No hip fracture, dislocation or avascular necrosis. 2. Moderate-severe osteoarthritis of the right hip. 3. Right labral degeneration with a tear of the superior and anterior labrum. Electronically Signed   By: Kathreen Devoid   On: 02/19/2020 13:03   DG FLUORO GUIDED NEEDLE PLC ASPIRATION/INJECTION LOC  Result Date: 02/21/2020 CLINICAL DATA:  Right hip pain EXAM: RIGHT HIP INJECTION UNDER FLUOROSCOPY FLUOROSCOPY TIME:  Fluoroscopy Time:  12 seconds Radiation Exposure Index (if provided by the fluoroscopic device): 3.3 mGy Number of Acquired Spot Images: 2 PROCEDURE: Informed written consent was obtained from the patient after discussion of the risks, benefits and alternatives to treatment. The patient was placed supine on the fluoroscopy table with right leg in slight internal rotation. The right hip was localized with fluoroscopy. The skin overlying the anterior aspect of the hip was prepped and draped in usual sterile fashion. The skin and subcutaneous tissues were anesthetized with 1% lidocaine. A 20 gauge 3.5 inch spinal needle was then advanced to the lateral aspect of the femoral head-neck junction under intermittent fluoroscopy. A small amount of lidocaine was injected, which flowed freely. Then, injection of 2 mL Omnipaque 180 confirms appropriate positioning within the right hip joint capsule. A fluoroscopic image was saved and sent to PACs. Therapeutic injection was performed with the administration of 1 mL of Kenalog 40 mg/mL mixed with 5 mL ropivacaine 0.5% which injected without difficulty.  The needle was removed and hemostasis was achieved. A dressing was placed. The patient tolerated the procedure well without immediate postprocedural complication. IMPRESSION: Technically successful right hip steroid injection under fluoroscopy. Electronically Signed   By: Davina Poke D.O.   On: 02/21/2020 14:28   DG Hip Unilat W or Wo Pelvis 2-3 Views Right  Result Date: 02/18/2020 CLINICAL DATA:  Right hip and groin pain for the last 4 days. Pain with movement. No injury. EXAM: DG HIP (WITH OR WITHOUT PELVIS) 2-3V RIGHT COMPARISON:  None. FINDINGS: No fracture or bone lesion. Hip joints are normally spaced and aligned. On the right, there is bony productive change along the inferior margin of the acetabulum. SI joints and symphysis pubis are normally spaced and aligned. Arterial atherosclerotic calcifications are noted along the iliac and femoral vessels. Soft tissues are otherwise unremarkable. IMPRESSION: 1. No fracture or acute finding. 2. Mild chronic appearing bony productive changes along the inferior right acetabulum. No other degenerative/arthropathic changes. Electronically Signed   By: Lajean Manes M.D.   On: 02/18/2020 14:21     Subjective:   Patient was seen and examined 02/22/2020, 8:43 AM Patient stable today. No acute distress.  No issues overnight Stable for discharge.  Discharge Exam:    Vitals:  02/21/20 2338 02/22/20 0021 02/22/20 0025 02/22/20 0739  BP: (!) 183/87 (!) 167/78 (!) 156/82 (!) 168/74  Pulse: 74 77 73 67  Resp: 17  18 14   Temp: 98.3 F (36.8 C)   98.6 F (37 C)  TempSrc: Oral   Oral  SpO2: 100%   100%  Weight:      Height:        General: Pt lying comfortably in bed & appears in no obvious distress. Cardiovascular: S1 & S2 heard, RRR, S1/S2 +. No murmurs, rubs, gallops or clicks. No JVD or pedal edema. Respiratory: Clear to auscultation without wheezing, rhonchi or crackles. No increased work of breathing. Abdominal:  Non-distended, non-tender  & soft. No organomegaly or masses appreciated. Normal bowel sounds heard. CNS: Alert and oriented. No focal deficits. Extremities: Bilateral hip pain right greater than left .... no edema, no cyanosis    The results of significant diagnostics from this hospitalization (including imaging, microbiology, ancillary and laboratory) are listed below for reference.      Microbiology:   Recent Results (from the past 240 hour(s))  SARS Coronavirus 2 by RT PCR (hospital order, performed in [facility] hospital lab) Nasopharyngeal Nasopharyngeal Swab     Status: None   Collection Time: 02/18/20 10:38 PM   Specimen: Nasopharyngeal Swab  Result Value Ref Range Status   SARS Coronavirus 2 NEGATIVE NEGATIVE Final    Comment: (NOTE) SARS-CoV-2 target nucleic acids are NOT DETECTED.  The SARS-CoV-2 RNA is generally detectable in upper and lower respiratory specimens during the acute phase of infection. The lowest concentration of SARS-CoV-2 viral copies this assay can detect is 250 copies / mL. A negative result does not preclude SARS-CoV-2 infection and should not be used as the sole basis for treatment or other patient management decisions.  A negative result may occur with improper specimen collection / handling, submission of specimen other than nasopharyngeal swab, presence of viral mutation(s) within the areas targeted by this assay, and inadequate number of viral copies (<250 copies / mL). A negative result must be combined with clinical observations, patient history, and epidemiological information.  Fact Sheet for Patients:   StrictlyIdeas.no  Fact Sheet for Healthcare Providers: BankingDealers.co.za  This test is not yet approved or  cleared by the [place] FDA and has been authorized for detection and/or diagnosis of SARS-CoV-2 by FDA under an Emergency Use Authorization (EUA).  This EUA will remain in effect (meaning this test  can be used) for the duration of the COVID-19 declaration under Section 564(b)(1) of the Act, 21 U.S.C. section 360bbb-3(b)(1), unless the authorization is terminated or revoked sooner.  Performed at [facility], [address] [place], [place] [address]      Labs:   CBC: Recent Labs  Lab 02/18/20 1346 02/19/20 0420 02/20/20 0412 02/21/20 0543 02/22/20 0539  WBC 7.3 7.1 5.2 4.5 6.1  HGB 12.3* 10.7* 10.5* 10.6* 11.5*  HCT 36.2* 32.6* 31.8* 32.0* 34.1*  MCV 86.2 88.3 88.6 88.4 86.1  PLT 154 139* 141* 140* 588   Basic Metabolic Panel: Recent Labs  Lab 02/18/20 1346 02/19/20 0420 02/20/20 0412 02/21/20 0543 02/22/20 0539  NA 139 139 139 137 136  K 3.7 3.4* 3.5 3.5 4.2  CL 100 101 103 101 102  CO2 26 27 27 25 23   GLUCOSE 272* 327* 188* 195* 291*  BUN 91* 88* 83* 84* 87*  CREATININE 3.55* 3.20* 2.72* 2.93* 2.72*  CALCIUM 8.7* 8.7* 8.6* 8.7* 9.1   Liver Function Tests:  Recent Labs  Lab 02/18/20 1346  AST 16  ALT 12  ALKPHOS 77  BILITOT 0.8  PROT 8.3*  ALBUMIN 3.9   BNP (last 3 results) Recent Labs    08/10/19 1043 02/19/20 0420  BNP 840.1* 406.2*   Cardiac Enzymes: No results for input(s): CKTOTAL, CKMB, CKMBINDEX, TROPONINI in the last 168 hours. CBG: Recent Labs  Lab 02/21/20 0729 02/21/20 1141 02/21/20 1628 02/21/20 2103 02/22/20 0740  GLUCAP 194* 362* 246* 265* 294*   Hgb A1c No results for input(s): HGBA1C in the last 72 hours. Lipid Profile No results for input(s): CHOL, HDL, LDLCALC, TRIG, CHOLHDL, LDLDIRECT in the last 72 hours. Thyroid function studies No results for input(s): TSH, T4TOTAL, T3FREE, THYROIDAB in the last 72 hours.  Invalid input(s): FREET3 Anemia work up No results for input(s): VITAMINB12, FOLATE, FERRITIN, TIBC, IRON, RETICCTPCT in the last 72 hours. Urinalysis    Component Value Date/Time   COLORURINE YELLOW (A) 02/19/2020 0130   APPEARANCEUR CLEAR (A) 02/19/2020 0130   LABSPEC 1.012 02/19/2020  0130   PHURINE 5.0 02/19/2020 0130   GLUCOSEU 50 (A) 02/19/2020 0130   HGBUR SMALL (A) 02/19/2020 0130   BILIRUBINUR NEGATIVE 02/19/2020 0130   KETONESUR NEGATIVE 02/19/2020 0130   PROTEINUR 100 (A) 02/19/2020 0130   NITRITE NEGATIVE 02/19/2020 0130   LEUKOCYTESUR NEGATIVE 02/19/2020 0130         Time coordinating discharge: Over 55 minutes  SIGNED: Deatra James, MD, FACP, FHM. [facility],  Please use [email or web address] to Page If 7PM-7AM, please contact night-coverage [email or web address] Ota [facility] 02/22/2020, 8:43 AM

## 2020-02-22 NOTE — Care Management Important Message (Signed)
Important Message  Patient Details  Name: Phillip [name] MRN: [id number] Date of Birth: [birth year]   Medicare Important Message Given:  Yes     Dannette Barbara 02/22/2020, 11:16 AM

## 2020-02-22 NOTE — Progress Notes (Signed)
Discharge Note: Reviewed discharge instructions. Pt verbalized understanding. Implementing the importance of monitoring Blood pressure at home, Obtained vitals. Removed IV catheter. Iv intact.  D/C with personal belongings. Grandson transported pt to home via private vehicle.

## 2020-02-22 NOTE — Progress Notes (Signed)
[facility]  ROUNDING NOTE   Subjective:   No complaints. Hip pain has resolved.   Objective:  Vital signs in last 24 hours:  Temp:  [97.8 F (36.6 C)-98.6 F (37 C)] 98 F (36.7 C) (09/21 1152) Pulse Rate:  [58-77] 63 (09/21 1152) Resp:  [14-18] 16 (09/21 1152) BP: (128-183)/(64-87) 168/80 (09/21 1158) SpO2:  [100 %] 100 % (09/21 1152)  Weight change:  Filed Weights   02/18/20 1331 02/20/20 0615  Weight: 99.3 kg 96.2 kg    Intake/Output: I/O last 3 completed shifts: In: 480 [P.O.:480] Out: 1450 [Urine:1450]   Intake/Output this shift:  Total I/O In: 240 [P.O.:240] Out: -   Physical Exam: General: Sitting in chair, appears comfortable  Head: Normocephalic, atraumatic. Moist oral mucosal membranes  Eyes: Sclerae and conjunctivae clear  Neck: Trachea at the midline  Lungs:  Bilaterally clear,Respiration even,unlabored  Heart: S1S2,Regular  Abdomen:  Non distended, non tender  Extremities: No peripheral edema.  Neurologic: Alert, oriented x3  Skin: No rashes or lesions       Basic Metabolic Panel: Recent Labs  Lab 02/18/20 1346 02/18/20 1346 02/19/20 0420 02/19/20 0420 02/20/20 0412 02/21/20 0543 02/22/20 0539  NA 139  --  139  --  139 137 136  K 3.7  --  3.4*  --  3.5 3.5 4.2  CL 100  --  101  --  103 101 102  CO2 26  --  27  --  27 25 23   GLUCOSE 272*  --  327*  --  188* 195* 291*  BUN 91*  --  88*  --  83* 84* 87*  CREATININE 3.55*  --  3.20*  --  2.72* 2.93* 2.72*  CALCIUM 8.7*   < > 8.7*   < > 8.6* 8.7* 9.1   < > = values in this interval not displayed.    Liver Function Tests: Recent Labs  Lab 02/18/20 1346  AST 16  ALT 12  ALKPHOS 77  BILITOT 0.8  PROT 8.3*  ALBUMIN 3.9   No results for input(s): LIPASE, AMYLASE in the last 168 hours. No results for input(s): AMMONIA in the last 168 hours.  CBC: Recent Labs  Lab 02/18/20 1346 02/19/20 0420 02/20/20 0412 02/21/20 0543 02/22/20 0539  WBC 7.3 7.1 5.2 4.5 6.1   HGB 12.3* 10.7* 10.5* 10.6* 11.5*  HCT 36.2* 32.6* 31.8* 32.0* 34.1*  MCV 86.2 88.3 88.6 88.4 86.1  PLT 154 139* 141* 140* 161    Cardiac Enzymes: No results for input(s): CKTOTAL, CKMB, CKMBINDEX, TROPONINI in the last 168 hours.  BNP: Invalid input(s): POCBNP  CBG: Recent Labs  Lab 02/21/20 1141 02/21/20 1628 02/21/20 2103 02/22/20 0740 02/22/20 1142  GLUCAP 362* 246* 265* 294* 318*    Microbiology: Results for orders placed or performed during the hospital encounter of 02/18/20  SARS Coronavirus 2 by RT PCR (hospital order, performed in [facility] hospital lab) Nasopharyngeal Nasopharyngeal Swab     Status: None   Collection Time: 02/18/20 10:38 PM   Specimen: Nasopharyngeal Swab  Result Value Ref Range Status   SARS Coronavirus 2 NEGATIVE NEGATIVE Final    Comment: (NOTE) SARS-CoV-2 target nucleic acids are NOT DETECTED.  The SARS-CoV-2 RNA is generally detectable in upper and lower respiratory specimens during the acute phase of infection. The lowest concentration of SARS-CoV-2 viral copies this assay can detect is 250 copies / mL. A negative result does not preclude SARS-CoV-2 infection and should not be used as the  sole basis for treatment or other patient management decisions.  A negative result may occur with improper specimen collection / handling, submission of specimen other than nasopharyngeal swab, presence of viral mutation(s) within the areas targeted by this assay, and inadequate number of viral copies (<250 copies / mL). A negative result must be combined with clinical observations, patient history, and epidemiological information.  Fact Sheet for Patients:   StrictlyIdeas.no  Fact Sheet for Healthcare Providers: BankingDealers.co.za  This test is not yet approved or  cleared by the [place] FDA and has been authorized for detection and/or diagnosis of SARS-CoV-2 by FDA under an Emergency Use  Authorization (EUA).  This EUA will remain in effect (meaning this test can be used) for the duration of the COVID-19 declaration under Section 564(b)(1) of the Act, 21 U.S.C. section 360bbb-3(b)(1), unless the authorization is terminated or revoked sooner.  Performed at [facility], [address] [place], [name] [address]     Coagulation Studies: No results for input(s): LABPROT, INR in the last 72 hours.  Urinalysis: No results for input(s): COLORURINE, LABSPEC, PHURINE, GLUCOSEU, HGBUR, BILIRUBINUR, KETONESUR, PROTEINUR, UROBILINOGEN, NITRITE, LEUKOCYTESUR in the last 72 hours.  Invalid input(s): APPERANCEUR    Imaging: DG FLUORO GUIDED NEEDLE PLC ASPIRATION/INJECTION LOC  Result Date: 02/21/2020 CLINICAL DATA:  Right hip pain EXAM: RIGHT HIP INJECTION UNDER FLUOROSCOPY FLUOROSCOPY TIME:  Fluoroscopy Time:  12 seconds Radiation Exposure Index (if provided by the fluoroscopic device): 3.3 mGy Number of Acquired Spot Images: 2 PROCEDURE: Informed written consent was obtained from the patient after discussion of the risks, benefits and alternatives to treatment. The patient was placed supine on the fluoroscopy table with right leg in slight internal rotation. The right hip was localized with fluoroscopy. The skin overlying the anterior aspect of the hip was prepped and draped in usual sterile fashion. The skin and subcutaneous tissues were anesthetized with 1% lidocaine. A 20 gauge 3.5 inch spinal needle was then advanced to the lateral aspect of the femoral head-neck junction under intermittent fluoroscopy. A small amount of lidocaine was injected, which flowed freely. Then, injection of 2 mL Omnipaque 180 confirms appropriate positioning within the right hip joint capsule. A fluoroscopic image was saved and sent to PACs. Therapeutic injection was performed with the administration of 1 mL of Kenalog 40 mg/mL mixed with 5 mL ropivacaine 0.5% which injected without difficulty. The  needle was removed and hemostasis was achieved. A dressing was placed. The patient tolerated the procedure well without immediate postprocedural complication. IMPRESSION: Technically successful right hip steroid injection under fluoroscopy. Electronically Signed   By: Davina Poke D.O.   On: 02/21/2020 14:28     Medications:    . aspirin EC  81 mg Oral Daily  . carvedilol  6.25 mg Oral BID WC  . cloNIDine  0.3 mg Oral BID  . clopidogrel  75 mg Oral Daily  . gabapentin  300 mg Oral BID  . heparin  5,000 Units Subcutaneous Q8H  . insulin aspart  0-6 Units Subcutaneous TID WC  . insulin detemir  15 Units Subcutaneous Daily  . isosorbide mononitrate  120 mg Oral Daily  . levothyroxine  25 mcg Oral Daily  . lidocaine (PF)  5 mL Other Once  . pravastatin  20 mg Oral q1800  . ropivacaine (PF) 5 mg/mL (0.5%)  5 mL Other Once  . sodium chloride flush  3 mL Intravenous Q12H  . tamsulosin  0.4 mg Oral Daily  . triamcinolone acetonide  40  mg Other Once   acetaminophen **OR** acetaminophen, iohexol, ondansetron **OR** ondansetron (ZOFRAN) IV  Assessment/ Plan:  Phillip [name] is a 64 y.o. black male with diabetes mellitus type II, diabetic neuropathy, hypertension, coronary artery disease status post CABG, peripheral vascular disease, status post left toe amputation, diastolic congestive heart failure, COPD, who was admitted to [facility] on 02/18/2020 for ARF (acute renal failure) ([place]) [N17.9] Right leg weakness [R29.898] Sciatica of right side [M54.31] AKI (acute kidney injury) ([place]) [N17.9] Acute kidney injury superimposed on CKD ([place]) [N17.9, N18.9]  #Acute renal failure   Baseline creatinine of 2.77 on 12/30/19 Acute renal failure could be due to obstructive uropathy - Started Flomax this admission - Holding bumetanide, lisinopril and metolazone  #Hypertension:  BP readings within acceptable range Continue Clonidine,Imdur and carvedilol  # Diabetes mellitus type II with chronic  kidney disease:   Hemoglobin A1c of 9.2%. Not well controlled,on Insulin Levemir and sliding scale  # Anemia with renal failure Hemoglobin today is 11.5, at goal    LOS: 3 Virginia Curl 9/21/202112:14 PM

## 2020-02-22 NOTE — Progress Notes (Signed)
Inpatient Diabetes Program Recommendations  AACE/ADA: New Consensus Statement on Inpatient Glycemic Control (2015)  Target Ranges:  Prepandial:   less than 140 mg/dL      Peak postprandial:   less than 180 mg/dL (1-2 hours)      Critically ill patients:  140 - 180 mg/dL   Lab Results  Component Value Date   GLUCAP 294 (H) 02/22/2020   HGBA1C 9.2 (H) 02/19/2020    Review of Glycemic Control Results for Phillip [name], Phillip [name] (MRN [id number]) as of 02/22/2020 09:47  Ref. Range 02/21/2020 07:29 02/21/2020 11:41 02/21/2020 16:28 02/21/2020 21:03 02/22/2020 07:40  Glucose-Capillary Latest Ref Range: 70 - 99 mg/dL 194 (H) 362 (H) 246 (H) 265 (H) 294 (H)    Diabetes history: DM 2 Outpatient Diabetes medications: Levemir 30-40 units every night Current orders for Inpatient glycemic control:  Levemir 15 units Daily Novolog 0-6 units tid  A1c 9.2%  Inpatient Diabetes Program Recommendations:    Hyperglycemia due to kenalog injection  -  Increase Levemir to 25 units   -  May consider Novolog 2 units tid meal coverage as glucose trends increase after meal  Intake.  Thanks,  Tama Headings RN, MSN, BC-ADM Inpatient Diabetes Coordinator Team Pager [phone] (8a-5p)

## 2020-02-24 ENCOUNTER — Telehealth: Payer: Self-pay | Admitting: Cardiology

## 2020-02-24 NOTE — Telephone Encounter (Signed)
New message:     Patient calling stating that he was in the hospital for a week, because his right was weak and patient could not walk. Patient need a follow up apt

## 2020-02-24 NOTE — Telephone Encounter (Signed)
Called patient. Appointment made. Pt verbalized understanding and had no additional questions.

## 2020-02-24 NOTE — Telephone Encounter (Signed)
Message left for pt to call back.

## 2020-03-06 DIAGNOSIS — I10 Essential (primary) hypertension: Secondary | ICD-10-CM | POA: Insufficient documentation

## 2020-03-07 ENCOUNTER — Other Ambulatory Visit: Payer: Self-pay

## 2020-03-07 ENCOUNTER — Ambulatory Visit (INDEPENDENT_AMBULATORY_CARE_PROVIDER_SITE_OTHER): Payer: PPO | Admitting: Cardiology

## 2020-03-07 ENCOUNTER — Encounter: Payer: Self-pay | Admitting: Cardiology

## 2020-03-07 VITALS — BP 147/74 | HR 66 | Ht 72.0 in | Wt 208.6 lb

## 2020-03-07 DIAGNOSIS — I2510 Atherosclerotic heart disease of native coronary artery without angina pectoris: Secondary | ICD-10-CM

## 2020-03-07 DIAGNOSIS — I350 Nonrheumatic aortic (valve) stenosis: Secondary | ICD-10-CM | POA: Diagnosis not present

## 2020-03-07 DIAGNOSIS — N184 Chronic kidney disease, stage 4 (severe): Secondary | ICD-10-CM | POA: Diagnosis not present

## 2020-03-07 DIAGNOSIS — E1122 Type 2 diabetes mellitus with diabetic chronic kidney disease: Secondary | ICD-10-CM | POA: Diagnosis not present

## 2020-03-07 DIAGNOSIS — I5042 Chronic combined systolic (congestive) and diastolic (congestive) heart failure: Secondary | ICD-10-CM

## 2020-03-07 DIAGNOSIS — Z794 Long term (current) use of insulin: Secondary | ICD-10-CM

## 2020-03-07 NOTE — Progress Notes (Signed)
Cardiology Office Note:    Date:  03/07/2020   ID:  Phillip [name], DOB 1955/11/21, MRN [id number]  PCP:  Raina Mina., MD  Cardiologist:  Jenean Lindau, MD   Referring MD: Raina Mina., MD    ASSESSMENT:    1. Chronic combined systolic and diastolic CHF (congestive heart failure) ([place])   2. Coronary artery disease involving native coronary artery of native heart without angina pectoris   3. Mild aortic stenosis   4. Type 2 diabetes mellitus with stage 4 chronic kidney disease, with long-term current use of insulin ([place])   5. CKD (chronic kidney disease) stage 4, GFR 15-29 ml/min (HCC)    PLAN:    In order of problems listed above:  1. I reviewed records from the hospital admission recently including lab work.  Renal function is stable and he is evaluated and followed by nephrologist. 2. Coronary artery disease: Stable at this time and on guideline directed medical therapy. 3. Mild to moderate aortic stenosis: Stable and asymptomatic.  No chest pain syncope or shortness of breath on exertion or any such issues.  I educated him about this. 4. Mixed dyslipidemia, diabetes mellitus and chronic kidney disease: Managed by primary care physician.  Lab work was reviewed renal function is stable.  Congestive heart failure education was given to the patient at extensive length. 5. Patient will be seen in follow-up appointment in 4 months or earlier if the patient has any concerns    Medication Adjustments/Labs and Tests Ordered: Current medicines are reviewed at length with the patient today.  Concerns regarding medicines are outlined above.  No orders of the defined types were placed in this encounter.  No orders of the defined types were placed in this encounter.    No chief complaint on file.    History of Present Illness:    Phillip [name] is a 64 y.o. male.  Patient has past medical history of coronary artery disease, mild to moderate aortic stenosis, diabetes  mellitus, advanced renal insufficiency and history of congestive heart failure.  He denies any problems at this time and takes care of activities of daily living.  He was treated at the hospital recently for sciatica and released.  Subsequently is done fine.  He does a good job with his diet and weighing himself on a regular basis.  At the time of my evaluation, the patient is alert awake oriented and in no distress.  Past Medical History:  Diagnosis Date  . Abdominopelvic abscess ([place]) 09/29/2018  . Abscess of appendix 09/22/2018  . Acute kidney injury ([place]) 10/07/2018  . Acute kidney injury superimposed on CKD ([place]) 02/18/2020  . Anemia of chronic disease 08/18/2019  . Anemia, chronic disease 08/18/2019  . Aortic stenosis, moderate 10/15/2019  . Appendicitis 09/08/2018  . ARF (acute renal failure) ([place]) 02/19/2020  . Asthma   . Atherosclerosis of native arteries of the extremities with ulceration ([place]) 02/24/2018   Formatting of this note might be different from the original. Last Assessment & Plan:  His ABIs today are 1.07 on the right and 0.99 on the left with biphasic waveforms.  Although these pressures may be somewhat elevated from medial calcification, his flow does appear to be reasonably good.  His left ABI was 0.58 prior to intervention. At this point, we will stretch out his follow-up and see him b  . Benign hypertension with CKD (chronic kidney disease) stage IV ([place]) 02/24/2018   Last Assessment & Plan:  blood  pressure control important in reducing the progression of atherosclerotic disease. On appropriate oral medications.  . Benign prostatic hyperplasia without lower urinary tract symptoms 01/14/2018  . Bilateral lower extremity edema 11/04/2018  . Bradycardia 03/25/2018  . Bruit of right carotid artery 07/26/2015  . CAD (coronary artery disease) 07/26/2015   Formatting of this note might be different from the original. [name]  Formatting of this note might be different from the original.  [name]  . Cardiac murmur 07/26/2015  . CHF (congestive heart failure) ([place])   . Chronic combined systolic and diastolic CHF (congestive heart failure) ([place]) 07/26/2015   Formatting of this note might be different from the original. [place] EF 35%  . Chronic idiopathic constipation 11/02/2018  . Chronic low back pain without sciatica 12/02/2018  . Chronic systolic heart failure ([place]) 07/26/2015   Formatting of this note might be different from the original. [place] EF 35%  . CKD (chronic kidney disease) stage 4, GFR 15-29 ml/min ([place]) 10/07/2018  . Coronary artery disease   . Diabetes mellitus without complication ([place])   . Dysphagia 07/26/2015  . Erectile dysfunction 07/14/2017  . Foot ulcer ([place])    left foot  . GERD (gastroesophageal reflux disease)   . Heart murmur   . Hematuria 07/27/2015  . High risk medication use 07/26/2015  . Hyperlipidemia 07/26/2015  . Hyperlipidemia associated with type 2 diabetes mellitus ([place]) 02/18/2020  . Hypertension   . Hypertension associated with diabetes ([place])   . Hypoglycemia 05/07/2019  . Hypothyroidism 11/14/2015  . Hypothyroidism (acquired) 11/14/2015  . Insomnia 11/02/2018  . Kidney insufficiency   . Malaise and fatigue 03/25/2018  . Mild aortic stenosis 05/12/2019  . Mitral regurgitation 05/12/2019  . Moderate aortic regurgitation 05/12/2019  . Non-compliance 05/05/2019  . Peripheral vascular disease ([place])   . Polyneuropathy in diabetes ([place]) 05/07/2019  . Primary osteoarthritis involving multiple joints 10/06/2019  . Primary osteoarthritis of right hip 02/21/2020  . Puncture wound of right hip 04/02/2018  . PVD (peripheral vascular disease) with claudication ([place]) 07/26/2015   Angioplasty 02/2018  . Regional wall motion abnormality of heart 05/07/2019  . Right groin pain 02/18/2020  . Screening for colon cancer 02/18/2018  . Sleep apnea 07/26/2015  . Subacute osteomyelitis of left foot ([place]) 03/25/2018  . Type 2 diabetes mellitus with stage 3 chronic kidney  disease, with long-term current use of insulin ([place]) 07/26/2015  . Type 2 diabetes mellitus with stage 4 chronic kidney disease, with long-term current use of insulin ([place]) 07/26/2015  . Vitamin B12 deficiency 06/16/2019  . Vitamin D deficiency 05/07/2018    Past Surgical History:  Procedure Laterality Date  . AMPUTATION TOE Left 03/13/2018   Procedure: AMPUTATION TOE-MPJ;  Surgeon: Samara Deist, DPM;  Location: [facility] ORS;  Service: Podiatry;  Laterality: Left;  . ANGIOPLASTY    . CARDIAC CATHETERIZATION    . CARDIAC SURGERY     heart bypass (4)  . CORONARY ARTERY BYPASS GRAFT    . LAPAROSCOPIC APPENDECTOMY N/A 09/08/2018   Procedure: APPENDECTOMY LAPAROSCOPIC;  Surgeon: Jules Husbands, MD;  Location: [facility] ORS;  Service: General;  Laterality: N/A;  . LOWER EXTREMITY ANGIOGRAPHY Left 03/02/2018   Procedure: LOWER EXTREMITY ANGIOGRAPHY;  Surgeon: Algernon Huxley, MD;  Location: [facility];  Service: Cardiovascular;  Laterality: Left;  . ROTATOR CUFF REPAIR Left     Current Medications: Current Meds  Medication Sig  . acetaminophen (TYLENOL) 500 MG tablet Take 1,000 mg by mouth every 6 (six) hours as  needed for moderate pain or headache.   Marland Kitchen aspirin 81 MG tablet Take 81 mg by mouth daily.  . bumetanide (BUMEX) 1 MG tablet Take 1 mg by mouth 2 (two) times daily.  . carvedilol (COREG) 6.25 MG tablet Take 2 tablets (12.5 mg total) by mouth 2 (two) times daily with a meal.  . cloNIDine (CATAPRES) 0.3 MG tablet Take 0.3 mg by mouth 2 (two) times daily.   . clopidogrel (PLAVIX) 75 MG tablet Take 1 tablet (75 mg total) by mouth daily.  Marland Kitchen gabapentin (NEURONTIN) 600 MG tablet Take 0.5 tablets (300 mg total) by mouth 2 (two) times daily. (Patient taking differently: Take 600 mg by mouth daily. )  . isosorbide mononitrate (IMDUR) 120 MG 24 hr tablet Take 120 mg by mouth daily.  Marland Kitchen LEVEMIR FLEXTOUCH 100 UNIT/ML FlexPen Inject 20 Units into the skin at bedtime.  Marland Kitchen levothyroxine (SYNTHROID) 25 MCG  tablet Take 25 mcg by mouth daily.   Marland Kitchen lovastatin (MEVACOR) 40 MG tablet Take 0.5 tablets (20 mg total) by mouth at bedtime.  . nitroGLYCERIN (NITROSTAT) 0.4 MG SL tablet Place 0.4 mg under the tongue every 5 (five) minutes as needed for chest pain.   . tamsulosin (FLOMAX) 0.4 MG CAPS capsule Take 1 capsule (0.4 mg total) by mouth daily.  . vitamin B-12 (CYANOCOBALAMIN) 500 MCG tablet Take 500 mcg by mouth daily.      Allergies:   Patient has no known allergies.   Social History   Socioeconomic History  . Marital status: Married    Spouse name: Not on file  . Number of children: Not on file  . Years of education: Not on file  . Highest education level: Not on file  Occupational History  . Not on file  Tobacco Use  . Smoking status: Former Smoker    Quit date: 2003    Years since quitting: 18.7  . Smokeless tobacco: Never Used  Vaping Use  . Vaping Use: Never used  Substance and Sexual Activity  . Alcohol use: Not Currently    Comment: 2003  . Drug use: Not Currently    Types: Marijuana  . Sexual activity: Not on file  Other Topics Concern  . Not on file  Social History Narrative  . Not on file   Social Determinants of Health   Financial Resource Strain:   . Difficulty of Paying Living Expenses: Not on file  Food Insecurity:   . Worried About Charity fundraiser in the Last Year: Not on file  . Ran Out of Food in the Last Year: Not on file  Transportation Needs:   . Lack of Transportation (Medical): Not on file  . Lack of Transportation (Non-Medical): Not on file  Physical Activity:   . Days of Exercise per Week: Not on file  . Minutes of Exercise per Session: Not on file  Stress:   . Feeling of Stress : Not on file  Social Connections:   . Frequency of Communication with Friends and Family: Not on file  . Frequency of Social Gatherings with Friends and Family: Not on file  . Attends Religious Services: Not on file  . Active Member of Clubs or Organizations: Not  on file  . Attends Archivist Meetings: Not on file  . Marital Status: Not on file     Family History: The patient's family history includes Diabetes in his mother; Heart attack in his brother; Heart disease in his brother; Hyperlipidemia in his mother; Hypertension  in his mother; Lung disease in his father.  ROS:   Please see the history of present illness.    All other systems reviewed and are negative.  EKGs/Labs/Other Studies Reviewed:    The following studies were reviewed today: Study Highlights   The left ventricular ejection fraction is severely decreased (<30%).  Nuclear stress EF: 23%.  There was no ST segment deviation noted during stress.  Defect 1: There is a medium defect of moderate severity present in the basal inferior, mid anterior, mid inferolateral, apical anterior, apical inferior and apical lateral location.  Findings consistent with prior myocardial infarction.  This is an intermediate risk study.  No evidence of ischemia. RV is enlarged and prominent.    IMPRESSIONS    1. The left ventricle has low normal systolic function, with an ejection  fraction of 50-55%. The cavity size was normal. There is moderate  concentric left ventricular hypertrophy. Left ventricular diastolic  Doppler parameters are consistent with  impaired relaxation.  2. The right ventricle has normal systolic function. The cavity was  mildly enlarged. There is no increase in right ventricular wall thickness.  3. Left atrial size was mildly dilated.  4. Right atrial size was mildly dilated.  5. The tricuspid valve is not well visualized. Tricuspid valve  regurgitation is mild-moderate.  6. The aortic valve is abnormal. Moderate thickening of the aortic valve.  Moderate calcification of the aortic valve. Aortic valve regurgitation is  trivial by color flow Doppler. Mild-moderate stenosis of the aortic valve.  7. The interatrial septum was not assessed.      Recent Labs: 08/10/2019: TSH 2.950 02/18/2020: ALT 12 02/19/2020: B Natriuretic Peptide 406.2 02/22/2020: BUN 87; Creatinine, Ser 2.72; Hemoglobin 11.5; Platelets 161; Potassium 4.2; Sodium 136  Recent Lipid Panel    Component Value Date/Time   CHOL 77 (L) 08/10/2019 1043   TRIG 65 08/10/2019 1043   HDL 25 (L) 08/10/2019 1043   CHOLHDL 3.1 08/10/2019 1043   LDLCALC 37 08/10/2019 1043    Physical Exam:    VS:  BP (!) 147/74   Pulse 66   Ht 6' (1.829 m)   Wt 208 lb 9.6 oz (94.6 kg)   SpO2 97%   BMI 28.29 kg/m     Wt Readings from Last 3 Encounters:  03/07/20 208 lb 9.6 oz (94.6 kg)  02/20/20 212 lb (96.2 kg)  11/15/19 212 lb 12.8 oz (96.5 kg)     GEN: Patient is in no acute distress HEENT: Normal NECK: No JVD; No carotid bruits LYMPHATICS: No lymphadenopathy CARDIAC: Hear sounds regular, 2/6 systolic murmur at the apex. RESPIRATORY:  Clear to auscultation without rales, wheezing or rhonchi  ABDOMEN: Soft, non-tender, non-distended MUSCULOSKELETAL:  No edema; No deformity  SKIN: Warm and dry NEUROLOGIC:  Alert and oriented x 3 PSYCHIATRIC:  Normal affect   Signed, Jenean Lindau, MD  03/07/2020 10:27 AM    [place]

## 2020-03-07 NOTE — Patient Instructions (Signed)
Medication Instructions:  No medication changes. *If you need a refill on your cardiac medications before your next appointment, please call your pharmacy*   Lab Work: None ordered If you have labs (blood work) drawn today and your tests are completely normal, you will receive your results only by: . MyChart Message (if you have MyChart) OR . A paper copy in the mail If you have any lab test that is abnormal or we need to change your treatment, we will call you to review the results.   Testing/Procedures: None ordered   Follow-Up: At [facility], you and your health needs are our priority.  As part of our continuing mission to provide you with exceptional heart care, we have created designated Provider Care Teams.  These Care Teams include your primary Cardiologist (physician) and Advanced Practice Providers (APPs -  Physician Assistants and Nurse Practitioners) who all work together to provide you with the care you need, when you need it.  We recommend signing up for the patient portal called "MyChart".  Sign up information is provided on this After Visit Summary.  MyChart is used to connect with patients for Virtual Visits (Telemedicine).  Patients are able to view lab/test results, encounter notes, upcoming appointments, etc.  Non-urgent messages can be sent to your provider as well.   To learn more about what you can do with MyChart, go to [email or web address].    Your next appointment:   3 month(s)  The format for your next appointment:   In Person  Provider:   Rajan Revankar, MD   Other Instructions NA  

## 2020-03-20 DIAGNOSIS — M1611 Unilateral primary osteoarthritis, right hip: Secondary | ICD-10-CM | POA: Diagnosis not present

## 2020-03-20 DIAGNOSIS — M25551 Pain in right hip: Secondary | ICD-10-CM | POA: Diagnosis not present

## 2020-03-20 DIAGNOSIS — M9963 Osseous and subluxation stenosis of intervertebral foramina of lumbar region: Secondary | ICD-10-CM | POA: Insufficient documentation

## 2020-03-20 DIAGNOSIS — M47816 Spondylosis without myelopathy or radiculopathy, lumbar region: Secondary | ICD-10-CM | POA: Insufficient documentation

## 2020-03-20 DIAGNOSIS — M4317 Spondylolisthesis, lumbosacral region: Secondary | ICD-10-CM

## 2020-03-20 HISTORY — DX: Spondylolisthesis, lumbosacral region: M43.17

## 2020-03-20 HISTORY — DX: Osseous and subluxation stenosis of intervertebral foramina of lumbar region: M99.63

## 2020-03-20 HISTORY — DX: Spondylosis without myelopathy or radiculopathy, lumbar region: M47.816

## 2020-03-21 DIAGNOSIS — N184 Chronic kidney disease, stage 4 (severe): Secondary | ICD-10-CM | POA: Diagnosis not present

## 2020-03-24 DIAGNOSIS — N179 Acute kidney failure, unspecified: Secondary | ICD-10-CM | POA: Diagnosis not present

## 2020-03-24 DIAGNOSIS — N184 Chronic kidney disease, stage 4 (severe): Secondary | ICD-10-CM | POA: Diagnosis not present

## 2020-03-24 DIAGNOSIS — E1122 Type 2 diabetes mellitus with diabetic chronic kidney disease: Secondary | ICD-10-CM | POA: Diagnosis not present

## 2020-03-24 DIAGNOSIS — I129 Hypertensive chronic kidney disease with stage 1 through stage 4 chronic kidney disease, or unspecified chronic kidney disease: Secondary | ICD-10-CM | POA: Diagnosis not present

## 2020-03-24 DIAGNOSIS — Z794 Long term (current) use of insulin: Secondary | ICD-10-CM | POA: Diagnosis not present

## 2020-03-31 DIAGNOSIS — G8929 Other chronic pain: Secondary | ICD-10-CM | POA: Insufficient documentation

## 2020-03-31 DIAGNOSIS — I2510 Atherosclerotic heart disease of native coronary artery without angina pectoris: Secondary | ICD-10-CM | POA: Diagnosis not present

## 2020-03-31 DIAGNOSIS — Z23 Encounter for immunization: Secondary | ICD-10-CM | POA: Diagnosis not present

## 2020-03-31 DIAGNOSIS — N184 Chronic kidney disease, stage 4 (severe): Secondary | ICD-10-CM | POA: Diagnosis not present

## 2020-03-31 DIAGNOSIS — I5022 Chronic systolic (congestive) heart failure: Secondary | ICD-10-CM | POA: Diagnosis not present

## 2020-03-31 DIAGNOSIS — I7025 Atherosclerosis of native arteries of other extremities with ulceration: Secondary | ICD-10-CM | POA: Diagnosis not present

## 2020-03-31 DIAGNOSIS — E1122 Type 2 diabetes mellitus with diabetic chronic kidney disease: Secondary | ICD-10-CM | POA: Diagnosis not present

## 2020-03-31 DIAGNOSIS — M25551 Pain in right hip: Secondary | ICD-10-CM | POA: Diagnosis not present

## 2020-03-31 DIAGNOSIS — Z794 Long term (current) use of insulin: Secondary | ICD-10-CM | POA: Diagnosis not present

## 2020-03-31 HISTORY — DX: Other chronic pain: G89.29

## 2020-05-09 DIAGNOSIS — E039 Hypothyroidism, unspecified: Secondary | ICD-10-CM | POA: Diagnosis not present

## 2020-05-09 DIAGNOSIS — M8949 Other hypertrophic osteoarthropathy, multiple sites: Secondary | ICD-10-CM | POA: Diagnosis not present

## 2020-05-09 DIAGNOSIS — E538 Deficiency of other specified B group vitamins: Secondary | ICD-10-CM | POA: Diagnosis not present

## 2020-05-09 DIAGNOSIS — E782 Mixed hyperlipidemia: Secondary | ICD-10-CM | POA: Diagnosis not present

## 2020-05-09 DIAGNOSIS — I2510 Atherosclerotic heart disease of native coronary artery without angina pectoris: Secondary | ICD-10-CM | POA: Diagnosis not present

## 2020-05-09 DIAGNOSIS — Z794 Long term (current) use of insulin: Secondary | ICD-10-CM | POA: Diagnosis not present

## 2020-05-09 DIAGNOSIS — E1122 Type 2 diabetes mellitus with diabetic chronic kidney disease: Secondary | ICD-10-CM | POA: Diagnosis not present

## 2020-05-09 DIAGNOSIS — N184 Chronic kidney disease, stage 4 (severe): Secondary | ICD-10-CM | POA: Diagnosis not present

## 2020-05-29 DIAGNOSIS — E1122 Type 2 diabetes mellitus with diabetic chronic kidney disease: Secondary | ICD-10-CM | POA: Diagnosis not present

## 2020-05-29 DIAGNOSIS — E875 Hyperkalemia: Secondary | ICD-10-CM | POA: Insufficient documentation

## 2020-05-29 DIAGNOSIS — N184 Chronic kidney disease, stage 4 (severe): Secondary | ICD-10-CM | POA: Diagnosis not present

## 2020-05-29 DIAGNOSIS — I129 Hypertensive chronic kidney disease with stage 1 through stage 4 chronic kidney disease, or unspecified chronic kidney disease: Secondary | ICD-10-CM | POA: Diagnosis not present

## 2020-05-29 DIAGNOSIS — Z794 Long term (current) use of insulin: Secondary | ICD-10-CM | POA: Diagnosis not present

## 2020-05-29 HISTORY — DX: Hyperkalemia: E87.5

## 2020-05-30 NOTE — Addendum Note (Signed)
Addended by: Jyl Heinz R on: 05/30/2020 04:00 PM   Modules accepted: Orders

## 2020-06-06 ENCOUNTER — Other Ambulatory Visit: Payer: Self-pay

## 2020-06-07 ENCOUNTER — Other Ambulatory Visit: Payer: Self-pay

## 2020-06-07 ENCOUNTER — Ambulatory Visit: Payer: PPO | Admitting: Cardiology

## 2020-06-07 ENCOUNTER — Encounter: Payer: Self-pay | Admitting: Cardiology

## 2020-06-07 VITALS — BP 144/68 | HR 74 | Ht 72.0 in | Wt 211.0 lb

## 2020-06-07 DIAGNOSIS — N183 Chronic kidney disease, stage 3 unspecified: Secondary | ICD-10-CM

## 2020-06-07 DIAGNOSIS — E1122 Type 2 diabetes mellitus with diabetic chronic kidney disease: Secondary | ICD-10-CM

## 2020-06-07 DIAGNOSIS — G4730 Sleep apnea, unspecified: Secondary | ICD-10-CM

## 2020-06-07 DIAGNOSIS — I2510 Atherosclerotic heart disease of native coronary artery without angina pectoris: Secondary | ICD-10-CM | POA: Diagnosis not present

## 2020-06-07 DIAGNOSIS — Z794 Long term (current) use of insulin: Secondary | ICD-10-CM

## 2020-06-07 DIAGNOSIS — E782 Mixed hyperlipidemia: Secondary | ICD-10-CM | POA: Diagnosis not present

## 2020-06-07 NOTE — Progress Notes (Signed)
Cardiology Office Note:    Date:  06/07/2020   ID:  Phillip [name], DOB Oct 15, 1955, MRN [id number]  PCP:  Phillip Mina., MD  Cardiologist:  Phillip Lindau, MD   Referring MD: Phillip Mina., MD    ASSESSMENT:    1. Coronary artery disease involving native coronary artery of native heart without angina pectoris   2. Mixed hyperlipidemia   3. Sleep apnea, unspecified type   4. Type 2 diabetes mellitus with stage 3 chronic kidney disease, with long-term current use of insulin, unspecified whether stage 3a or 3b CKD ([place])    PLAN:    In order of problems listed above:  1. Coronary artery disease: Secondary prevention stressed with the patient.  Importance of compliance with diet medication stressed and vocalized understanding.  He walks to the best of his ability and he has issues with his hip being recommended hip replacement but is not keen on it 2. Essential hypertension: Blood pressure stable and diet was emphasized. 3. Mixed dyslipidemia: Diet was emphasized.  Lipids reviewed from March and he is planning to get blood work done in 2 months when he sees his primary care doctor 4. Diabetes mellitus and advanced insufficiency: Managed by primary care physician and stable. 5. Patient will be seen in follow-up appointment in 6 months or earlier if the patient has any concerns    Medication Adjustments/Labs and Tests Ordered: Current medicines are reviewed at length with the patient today.  Concerns regarding medicines are outlined above.  No orders of the defined types were placed in this encounter.  No orders of the defined types were placed in this encounter.    No chief complaint on file.    History of Present Illness:    Phillip [name] is a 65 y.o. male.  Patient has past medical history of atherosclerotic vascular disease, essential hypertension, coronary disease dyslipidemia diabetes mellitus and advanced renal insufficiency.  Patient denies any problems at this  time and takes care of activities of daily living.  No chest pain orthopnea or PND.  At the time of my evaluation, the patient is alert awake oriented and in no distress.  Past Medical History:  Diagnosis Date  . Abdominopelvic abscess ([place]) 09/29/2018  . Abscess of appendix 09/22/2018  . Acquired spondylolisthesis of lumbosacral region 03/20/2020  . Acute kidney injury ([place]) 10/07/2018  . Acute kidney injury superimposed on CKD () 02/18/2020  . Anemia of chronic disease 08/18/2019  . Anemia, chronic disease 08/18/2019  . Aortic stenosis, moderate 10/15/2019  . Appendicitis 09/08/2018  . ARF (acute renal failure) ([place]) 02/19/2020  . Asthma   . Atherosclerosis of native arteries of the extremities with ulceration ([place]) 02/24/2018   Formatting of this note might be different from the original. Last Assessment & Plan:  His ABIs today are 1.07 on the right and 0.99 on the left with biphasic waveforms.  Although these pressures may be somewhat elevated from medial calcification, his flow does appear to be reasonably good.  His left ABI was 0.58 prior to intervention. At this point, we will stretch out his follow-up and see him b  . Benign hypertension with CKD (chronic kidney disease) stage IV ([place]) 02/24/2018   Last Assessment & Plan:  blood pressure control important in reducing the progression of atherosclerotic disease. On appropriate oral medications.  . Benign prostatic hyperplasia without lower urinary tract symptoms 01/14/2018  . Bilateral lower extremity edema 11/04/2018  . Bradycardia 03/25/2018  . Bruit of right  carotid artery 07/26/2015  . CAD (coronary artery disease) 07/26/2015   Formatting of this note might be different from the original. [name]  Formatting of this note might be different from the original. [name]  . Cardiac murmur 07/26/2015  . CHF (congestive heart failure) ([place])   . Chronic combined systolic and diastolic CHF (congestive heart failure) ([place]) 07/26/2015   Formatting of  this note might be different from the original. [place] EF 35%  . Chronic idiopathic constipation 11/02/2018  . Chronic low back pain without sciatica 12/02/2018  . Chronic pain of right hip 03/31/2020  . Chronic systolic heart failure ([place]) 07/26/2015   Formatting of this note might be different from the original. [place] EF 35%  . CKD (chronic kidney disease) stage 4, GFR 15-29 ml/min ([place]) 10/07/2018  . Coronary artery disease   . Diabetes mellitus without complication ([place])   . Dysphagia 07/26/2015  . Erectile dysfunction 07/14/2017  . Foot ulcer ([place])    left foot  . GERD (gastroesophageal reflux disease)   . Heart murmur   . Hematuria 07/27/2015  . High risk medication use 07/26/2015  . Hyperkalemia 05/29/2020  . Hyperlipidemia 07/26/2015  . Hyperlipidemia associated with type 2 diabetes mellitus ([place]) 02/18/2020  . Hypertension   . Hypertension associated with diabetes ([place])   . Hypoglycemia 05/07/2019  . Hypothyroidism 11/14/2015  . Hypothyroidism (acquired) 11/14/2015  . Insomnia 11/02/2018  . Kidney insufficiency   . Lumbar spondylosis 03/20/2020  . Malaise and fatigue 03/25/2018  . Mild aortic stenosis 05/12/2019  . Mitral regurgitation 05/12/2019  . Moderate aortic regurgitation 05/12/2019  . Non-compliance 05/05/2019  . Osseous and subluxation stenosis of intervertebral foramina of lumbar region 03/20/2020  . Peripheral vascular disease ([place])   . Polyneuropathy in diabetes ([place]) 05/07/2019  . Primary osteoarthritis involving multiple joints 10/06/2019  . Primary osteoarthritis of right hip 02/21/2020  . Puncture wound of right hip 04/02/2018  . PVD (peripheral vascular disease) with claudication ([place]) 07/26/2015   Angioplasty 02/2018  . Regional wall motion abnormality of heart 05/07/2019  . Right groin pain 02/18/2020  . Screening for colon cancer 02/18/2018  . Sleep apnea 07/26/2015  . Subacute osteomyelitis of left foot ([place]) 03/25/2018  . Type 2 diabetes mellitus with stage 3 chronic  kidney disease, with long-term current use of insulin ([place]) 07/26/2015  . Type 2 diabetes mellitus with stage 4 chronic kidney disease, with long-term current use of insulin ([place]) 07/26/2015  . Vitamin B12 deficiency 06/16/2019  . Vitamin D deficiency 05/07/2018    Past Surgical History:  Procedure Laterality Date  . AMPUTATION TOE Left 03/13/2018   Procedure: AMPUTATION TOE-MPJ;  Surgeon: Samara Deist, DPM;  Location: [facility] ORS;  Service: Podiatry;  Laterality: Left;  . ANGIOPLASTY    . CARDIAC CATHETERIZATION    . CARDIAC SURGERY     heart bypass (4)  . CORONARY ARTERY BYPASS GRAFT    . LAPAROSCOPIC APPENDECTOMY N/A 09/08/2018   Procedure: APPENDECTOMY LAPAROSCOPIC;  Surgeon: Jules Husbands, MD;  Location: [facility] ORS;  Service: General;  Laterality: N/A;  . LOWER EXTREMITY ANGIOGRAPHY Left 03/02/2018   Procedure: LOWER EXTREMITY ANGIOGRAPHY;  Surgeon: Algernon Huxley, MD;  Location: [facility];  Service: Cardiovascular;  Laterality: Left;  . ROTATOR CUFF REPAIR Left     Current Medications: Current Meds  Medication Sig  . acetaminophen (TYLENOL) 500 MG tablet Take 1,000 mg by mouth every 6 (six) hours as needed for moderate pain or headache.   Marland Kitchen aspirin 81  MG tablet Take 81 mg by mouth daily.  . bumetanide (BUMEX) 1 MG tablet Take 1 mg by mouth daily.  . cloNIDine (CATAPRES) 0.3 MG tablet Take 0.3 mg by mouth 2 (two) times daily.   . clopidogrel (PLAVIX) 75 MG tablet Take 1 tablet (75 mg total) by mouth daily.  Marland Kitchen gabapentin (NEURONTIN) 300 MG capsule Take 300 mg by mouth 2 (two) times daily.  . isosorbide mononitrate (IMDUR) 120 MG 24 hr tablet Take 120 mg by mouth daily.  Marland Kitchen LEVEMIR FLEXTOUCH 100 UNIT/ML FlexPen Inject 20 Units into the skin at bedtime.  Marland Kitchen levothyroxine (SYNTHROID) 25 MCG tablet Take 25 mcg by mouth daily.   Marland Kitchen lovastatin (MEVACOR) 40 MG tablet Take 40 mg by mouth at bedtime.  . metolazone (ZAROXOLYN) 2.5 MG tablet Take 2.5 mg by mouth daily.  . nitroGLYCERIN  (NITROSTAT) 0.4 MG SL tablet Place 0.4 mg under the tongue every 5 (five) minutes as needed for chest pain.   . tamsulosin (FLOMAX) 0.4 MG CAPS capsule Take 0.4 mg by mouth daily.  . traMADol (ULTRAM) 50 MG tablet Take 50 mg by mouth every 6 (six) hours as needed.  . vitamin B-12 (CYANOCOBALAMIN) 500 MCG tablet Take 500 mcg by mouth daily.      Allergies:   Patient has no known allergies.   Social History   Socioeconomic History  . Marital status: Married    Spouse name: Not on file  . Number of children: Not on file  . Years of education: Not on file  . Highest education level: Not on file  Occupational History  . Not on file  Tobacco Use  . Smoking status: Former Smoker    Quit date: 2003    Years since quitting: 19.0  . Smokeless tobacco: Never Used  Vaping Use  . Vaping Use: Never used  Substance and Sexual Activity  . Alcohol use: Not Currently    Comment: 2003  . Drug use: Not Currently    Types: Marijuana  . Sexual activity: Not on file  Other Topics Concern  . Not on file  Social History Narrative  . Not on file   Social Determinants of Health   Financial Resource Strain: Not on file  Food Insecurity: Not on file  Transportation Needs: Not on file  Physical Activity: Not on file  Stress: Not on file  Social Connections: Not on file     Family History: The patient's family history includes Diabetes in his mother; Heart attack in his brother; Heart disease in his brother; Hyperlipidemia in his mother; Hypertension in his mother; Lung disease in his father.  ROS:   Please see the history of present illness.    All other systems reviewed and are negative.  EKGs/Labs/Other Studies Reviewed:    The following studies were reviewed today: I discussed my findings with the patient at length   Recent Labs: 08/10/2019: TSH 2.950 02/18/2020: ALT 12 02/19/2020: B Natriuretic Peptide 406.2 02/22/2020: BUN 87; Creatinine, Ser 2.72; Hemoglobin 11.5; Platelets 161;  Potassium 4.2; Sodium 136  Recent Lipid Panel    Component Value Date/Time   CHOL 77 (L) 08/10/2019 1043   TRIG 65 08/10/2019 1043   HDL 25 (L) 08/10/2019 1043   CHOLHDL 3.1 08/10/2019 1043   LDLCALC 37 08/10/2019 1043    Physical Exam:    VS:  BP (!) 144/68   Pulse 74   Ht 6' (1.829 m)   Wt 211 lb (95.7 kg)   SpO2 97%  BMI 28.62 kg/m     Wt Readings from Last 3 Encounters:  06/07/20 211 lb (95.7 kg)  03/07/20 208 lb 9.6 oz (94.6 kg)  02/20/20 212 lb (96.2 kg)     GEN: Patient is in no acute distress HEENT: Normal NECK: No JVD; No carotid bruits LYMPHATICS: No lymphadenopathy CARDIAC: Hear sounds regular, 2/6 systolic murmur at the apex. RESPIRATORY:  Clear to auscultation without rales, wheezing or rhonchi  ABDOMEN: Soft, non-tender, non-distended MUSCULOSKELETAL:  No edema; No deformity  SKIN: Warm and dry NEUROLOGIC:  Alert and oriented x 3 PSYCHIATRIC:  Normal affect   Signed, Phillip Lindau, MD  06/07/2020 1:19 PM    [facility]

## 2020-06-07 NOTE — Patient Instructions (Signed)

## 2020-08-23 ENCOUNTER — Telehealth: Payer: Self-pay | Admitting: Cardiology

## 2020-08-23 NOTE — Telephone Encounter (Signed)
Pt c/o Shortness Of Breath: STAT if SOB developed within the last 24 hours or pt is noticeably SOB on the phone  1. Are you currently SOB (can you hear that pt is SOB on the phone)? no  2. How long have you been experiencing SOB? About a week  3. Are you SOB when sitting or when up moving around? Up and moving around   4. Are you currently experiencing any other symptoms? Coughing up blood after laying down, R Nostril nosebleed,  Swollen legs,  Pt c/o swelling: STAT is pt has developed SOB within 24 hours  1) How much weight have you gained and in what time span? Patient has lost wt. Pt is currently 209 but went down from 213  2) If swelling, where is the swelling located? Feet and legs  3) Are you currently taking a fluid pill? Yes, but patient can only take so much because his kidney doctor is worried about his kidney function  4) Are you currently SOB?   5) Do you have a log of your daily weights (if so, list)?   6) Have you gained 3 pounds in a day or 5 pounds in a week?   Have you traveled recently? No   Patient does not want to go to the ER

## 2020-08-23 NOTE — Telephone Encounter (Signed)
Pt advised to go to the ED but refused. Pt states that last night was bad coughing up blood. I again told him to call 911 and go to the ED but once again he refused. How do you advise?

## 2020-08-24 NOTE — Telephone Encounter (Signed)
Spoke with pt and he states he will go today and be seen. Pt has been on aspirin since 2015 and Plavix since 2019.  Aspirin was started 2015 after: 1.  Catheter placement into right peroneal artery from left femoral approach.  2.  Aortogram and selective right lower extremity angiogram.  3.  Percutaneous transluminal angioplasty of right tibioperoneal trunk and peroneal arteries with 3 mm diameter angioplasty balloon.  4.  Percutaneous transluminal angioplasty of right popliteal artery with a 4 mm diameter angioplasty balloon.  5.  StarClose closure device, left femoral artery.   Plavix was started in addition to aspirin 02/2018 after LOWER EXTREMITY ANGIOGRAPHY.

## 2020-08-24 NOTE — Telephone Encounter (Signed)
He MUST go the the ED. Also have him call pcp. Check why he is on asa and plavix. This isserious and with his multiple issues, it is not advisable to do OP treatment.

## 2020-09-26 DIAGNOSIS — R5383 Other fatigue: Secondary | ICD-10-CM | POA: Diagnosis not present

## 2020-09-26 DIAGNOSIS — Z6829 Body mass index (BMI) 29.0-29.9, adult: Secondary | ICD-10-CM | POA: Diagnosis not present

## 2020-09-26 DIAGNOSIS — Z79890 Hormone replacement therapy (postmenopausal): Secondary | ICD-10-CM | POA: Diagnosis not present

## 2020-09-26 DIAGNOSIS — M7989 Other specified soft tissue disorders: Secondary | ICD-10-CM | POA: Diagnosis not present

## 2020-09-26 DIAGNOSIS — G8929 Other chronic pain: Secondary | ICD-10-CM | POA: Diagnosis not present

## 2020-09-26 DIAGNOSIS — I088 Other rheumatic multiple valve diseases: Secondary | ICD-10-CM | POA: Diagnosis not present

## 2020-09-26 DIAGNOSIS — E1142 Type 2 diabetes mellitus with diabetic polyneuropathy: Secondary | ICD-10-CM | POA: Diagnosis not present

## 2020-09-26 DIAGNOSIS — Z79899 Other long term (current) drug therapy: Secondary | ICD-10-CM | POA: Diagnosis not present

## 2020-09-26 DIAGNOSIS — Z20822 Contact with and (suspected) exposure to covid-19: Secondary | ICD-10-CM | POA: Diagnosis not present

## 2020-09-26 DIAGNOSIS — E039 Hypothyroidism, unspecified: Secondary | ICD-10-CM | POA: Diagnosis not present

## 2020-09-26 DIAGNOSIS — N184 Chronic kidney disease, stage 4 (severe): Secondary | ICD-10-CM | POA: Diagnosis not present

## 2020-09-26 DIAGNOSIS — E11649 Type 2 diabetes mellitus with hypoglycemia without coma: Secondary | ICD-10-CM | POA: Diagnosis not present

## 2020-09-26 DIAGNOSIS — R0602 Shortness of breath: Secondary | ICD-10-CM | POA: Diagnosis not present

## 2020-09-26 DIAGNOSIS — I517 Cardiomegaly: Secondary | ICD-10-CM | POA: Diagnosis not present

## 2020-09-26 DIAGNOSIS — Z87891 Personal history of nicotine dependence: Secondary | ICD-10-CM | POA: Diagnosis not present

## 2020-09-26 DIAGNOSIS — R34 Anuria and oliguria: Secondary | ICD-10-CM | POA: Diagnosis not present

## 2020-09-26 DIAGNOSIS — Z7902 Long term (current) use of antithrombotics/antiplatelets: Secondary | ICD-10-CM | POA: Diagnosis not present

## 2020-09-26 DIAGNOSIS — E1122 Type 2 diabetes mellitus with diabetic chronic kidney disease: Secondary | ICD-10-CM | POA: Diagnosis not present

## 2020-09-26 DIAGNOSIS — I21A1 Myocardial infarction type 2: Secondary | ICD-10-CM

## 2020-09-26 DIAGNOSIS — E785 Hyperlipidemia, unspecified: Secondary | ICD-10-CM | POA: Diagnosis not present

## 2020-09-26 DIAGNOSIS — E1151 Type 2 diabetes mellitus with diabetic peripheral angiopathy without gangrene: Secondary | ICD-10-CM | POA: Diagnosis not present

## 2020-09-26 DIAGNOSIS — I5023 Acute on chronic systolic (congestive) heart failure: Secondary | ICD-10-CM | POA: Diagnosis not present

## 2020-09-26 DIAGNOSIS — N189 Chronic kidney disease, unspecified: Secondary | ICD-10-CM | POA: Diagnosis not present

## 2020-09-26 DIAGNOSIS — I2510 Atherosclerotic heart disease of native coronary artery without angina pectoris: Secondary | ICD-10-CM | POA: Diagnosis not present

## 2020-09-26 DIAGNOSIS — I130 Hypertensive heart and chronic kidney disease with heart failure and stage 1 through stage 4 chronic kidney disease, or unspecified chronic kidney disease: Secondary | ICD-10-CM | POA: Diagnosis not present

## 2020-09-26 DIAGNOSIS — J810 Acute pulmonary edema: Secondary | ICD-10-CM | POA: Diagnosis not present

## 2020-09-26 DIAGNOSIS — I248 Other forms of acute ischemic heart disease: Secondary | ICD-10-CM | POA: Diagnosis not present

## 2020-09-26 DIAGNOSIS — Z794 Long term (current) use of insulin: Secondary | ICD-10-CM | POA: Diagnosis not present

## 2020-09-26 DIAGNOSIS — R079 Chest pain, unspecified: Secondary | ICD-10-CM | POA: Diagnosis not present

## 2020-09-26 DIAGNOSIS — Z951 Presence of aortocoronary bypass graft: Secondary | ICD-10-CM | POA: Diagnosis not present

## 2020-09-26 DIAGNOSIS — I214 Non-ST elevation (NSTEMI) myocardial infarction: Secondary | ICD-10-CM | POA: Diagnosis not present

## 2020-09-26 DIAGNOSIS — Z7982 Long term (current) use of aspirin: Secondary | ICD-10-CM | POA: Diagnosis not present

## 2020-09-26 DIAGNOSIS — I272 Pulmonary hypertension, unspecified: Secondary | ICD-10-CM | POA: Diagnosis not present

## 2020-09-26 HISTORY — DX: Myocardial infarction type 2: I21.A1

## 2020-09-27 DIAGNOSIS — Z79890 Hormone replacement therapy (postmenopausal): Secondary | ICD-10-CM | POA: Diagnosis not present

## 2020-09-27 DIAGNOSIS — E1142 Type 2 diabetes mellitus with diabetic polyneuropathy: Secondary | ICD-10-CM | POA: Diagnosis not present

## 2020-09-27 DIAGNOSIS — Z794 Long term (current) use of insulin: Secondary | ICD-10-CM | POA: Insufficient documentation

## 2020-09-27 DIAGNOSIS — I5023 Acute on chronic systolic (congestive) heart failure: Secondary | ICD-10-CM | POA: Diagnosis not present

## 2020-09-27 DIAGNOSIS — E039 Hypothyroidism, unspecified: Secondary | ICD-10-CM | POA: Diagnosis not present

## 2020-09-27 DIAGNOSIS — L97509 Non-pressure chronic ulcer of other part of unspecified foot with unspecified severity: Secondary | ICD-10-CM | POA: Insufficient documentation

## 2020-09-27 DIAGNOSIS — I21A1 Myocardial infarction type 2: Secondary | ICD-10-CM | POA: Diagnosis not present

## 2020-09-27 DIAGNOSIS — N189 Chronic kidney disease, unspecified: Secondary | ICD-10-CM | POA: Diagnosis not present

## 2020-09-27 DIAGNOSIS — Z7982 Long term (current) use of aspirin: Secondary | ICD-10-CM | POA: Diagnosis not present

## 2020-09-27 DIAGNOSIS — J45909 Unspecified asthma, uncomplicated: Secondary | ICD-10-CM | POA: Insufficient documentation

## 2020-09-27 DIAGNOSIS — Z87891 Personal history of nicotine dependence: Secondary | ICD-10-CM | POA: Insufficient documentation

## 2020-09-27 DIAGNOSIS — I272 Pulmonary hypertension, unspecified: Secondary | ICD-10-CM | POA: Diagnosis not present

## 2020-09-27 DIAGNOSIS — Z951 Presence of aortocoronary bypass graft: Secondary | ICD-10-CM | POA: Diagnosis not present

## 2020-09-27 DIAGNOSIS — G8929 Other chronic pain: Secondary | ICD-10-CM | POA: Diagnosis not present

## 2020-09-27 DIAGNOSIS — I130 Hypertensive heart and chronic kidney disease with heart failure and stage 1 through stage 4 chronic kidney disease, or unspecified chronic kidney disease: Secondary | ICD-10-CM | POA: Diagnosis not present

## 2020-09-27 DIAGNOSIS — K219 Gastro-esophageal reflux disease without esophagitis: Secondary | ICD-10-CM | POA: Insufficient documentation

## 2020-09-27 DIAGNOSIS — I214 Non-ST elevation (NSTEMI) myocardial infarction: Secondary | ICD-10-CM | POA: Insufficient documentation

## 2020-09-27 DIAGNOSIS — I2510 Atherosclerotic heart disease of native coronary artery without angina pectoris: Secondary | ICD-10-CM | POA: Diagnosis not present

## 2020-09-27 DIAGNOSIS — E1122 Type 2 diabetes mellitus with diabetic chronic kidney disease: Secondary | ICD-10-CM | POA: Diagnosis not present

## 2020-09-27 DIAGNOSIS — I088 Other rheumatic multiple valve diseases: Secondary | ICD-10-CM | POA: Diagnosis not present

## 2020-09-27 HISTORY — DX: Non-pressure chronic ulcer of other part of unspecified foot with unspecified severity: L97.509

## 2020-09-27 HISTORY — DX: Non-ST elevation (NSTEMI) myocardial infarction: I21.4

## 2020-09-27 HISTORY — DX: Personal history of nicotine dependence: Z87.891

## 2020-09-27 HISTORY — DX: Long term (current) use of insulin: Z79.4

## 2020-09-28 DIAGNOSIS — I21A1 Myocardial infarction type 2: Secondary | ICD-10-CM | POA: Diagnosis not present

## 2020-09-28 DIAGNOSIS — Z794 Long term (current) use of insulin: Secondary | ICD-10-CM | POA: Diagnosis not present

## 2020-09-28 DIAGNOSIS — E1122 Type 2 diabetes mellitus with diabetic chronic kidney disease: Secondary | ICD-10-CM | POA: Diagnosis not present

## 2020-09-28 DIAGNOSIS — Z79899 Other long term (current) drug therapy: Secondary | ICD-10-CM | POA: Diagnosis not present

## 2020-09-28 DIAGNOSIS — I5023 Acute on chronic systolic (congestive) heart failure: Secondary | ICD-10-CM | POA: Diagnosis not present

## 2020-09-28 DIAGNOSIS — I2510 Atherosclerotic heart disease of native coronary artery without angina pectoris: Secondary | ICD-10-CM | POA: Diagnosis not present

## 2020-09-28 DIAGNOSIS — E039 Hypothyroidism, unspecified: Secondary | ICD-10-CM | POA: Diagnosis not present

## 2020-09-28 DIAGNOSIS — N189 Chronic kidney disease, unspecified: Secondary | ICD-10-CM | POA: Diagnosis not present

## 2020-09-28 DIAGNOSIS — I130 Hypertensive heart and chronic kidney disease with heart failure and stage 1 through stage 4 chronic kidney disease, or unspecified chronic kidney disease: Secondary | ICD-10-CM | POA: Diagnosis not present

## 2020-09-28 DIAGNOSIS — G8929 Other chronic pain: Secondary | ICD-10-CM | POA: Diagnosis not present

## 2020-09-28 DIAGNOSIS — Z951 Presence of aortocoronary bypass graft: Secondary | ICD-10-CM | POA: Diagnosis not present

## 2020-09-28 DIAGNOSIS — E1142 Type 2 diabetes mellitus with diabetic polyneuropathy: Secondary | ICD-10-CM | POA: Diagnosis not present

## 2020-09-29 DIAGNOSIS — I214 Non-ST elevation (NSTEMI) myocardial infarction: Secondary | ICD-10-CM | POA: Diagnosis not present

## 2020-09-29 DIAGNOSIS — E1142 Type 2 diabetes mellitus with diabetic polyneuropathy: Secondary | ICD-10-CM | POA: Diagnosis not present

## 2020-09-29 DIAGNOSIS — E1151 Type 2 diabetes mellitus with diabetic peripheral angiopathy without gangrene: Secondary | ICD-10-CM | POA: Diagnosis not present

## 2020-09-29 DIAGNOSIS — I130 Hypertensive heart and chronic kidney disease with heart failure and stage 1 through stage 4 chronic kidney disease, or unspecified chronic kidney disease: Secondary | ICD-10-CM | POA: Diagnosis not present

## 2020-09-29 DIAGNOSIS — E11649 Type 2 diabetes mellitus with hypoglycemia without coma: Secondary | ICD-10-CM | POA: Diagnosis not present

## 2020-09-29 DIAGNOSIS — E1122 Type 2 diabetes mellitus with diabetic chronic kidney disease: Secondary | ICD-10-CM | POA: Diagnosis not present

## 2020-09-29 DIAGNOSIS — I5023 Acute on chronic systolic (congestive) heart failure: Secondary | ICD-10-CM | POA: Diagnosis not present

## 2020-09-29 DIAGNOSIS — N189 Chronic kidney disease, unspecified: Secondary | ICD-10-CM | POA: Diagnosis not present

## 2020-09-29 DIAGNOSIS — I2510 Atherosclerotic heart disease of native coronary artery without angina pectoris: Secondary | ICD-10-CM | POA: Diagnosis not present

## 2020-10-16 DIAGNOSIS — M159 Polyosteoarthritis, unspecified: Secondary | ICD-10-CM | POA: Diagnosis not present

## 2020-10-16 DIAGNOSIS — I739 Peripheral vascular disease, unspecified: Secondary | ICD-10-CM | POA: Diagnosis not present

## 2020-10-16 DIAGNOSIS — E039 Hypothyroidism, unspecified: Secondary | ICD-10-CM | POA: Diagnosis not present

## 2020-10-16 DIAGNOSIS — D638 Anemia in other chronic diseases classified elsewhere: Secondary | ICD-10-CM | POA: Diagnosis not present

## 2020-10-16 DIAGNOSIS — I130 Hypertensive heart and chronic kidney disease with heart failure and stage 1 through stage 4 chronic kidney disease, or unspecified chronic kidney disease: Secondary | ICD-10-CM | POA: Diagnosis not present

## 2020-10-16 DIAGNOSIS — I5022 Chronic systolic (congestive) heart failure: Secondary | ICD-10-CM | POA: Diagnosis not present

## 2020-10-16 DIAGNOSIS — Z0000 Encounter for general adult medical examination without abnormal findings: Secondary | ICD-10-CM | POA: Diagnosis not present

## 2020-10-16 DIAGNOSIS — E1122 Type 2 diabetes mellitus with diabetic chronic kidney disease: Secondary | ICD-10-CM | POA: Diagnosis not present

## 2020-10-16 DIAGNOSIS — E782 Mixed hyperlipidemia: Secondary | ICD-10-CM | POA: Diagnosis not present

## 2020-10-16 DIAGNOSIS — E538 Deficiency of other specified B group vitamins: Secondary | ICD-10-CM | POA: Diagnosis not present

## 2020-10-16 DIAGNOSIS — Z125 Encounter for screening for malignant neoplasm of prostate: Secondary | ICD-10-CM | POA: Diagnosis not present

## 2020-10-16 DIAGNOSIS — Z794 Long term (current) use of insulin: Secondary | ICD-10-CM | POA: Diagnosis not present

## 2020-10-16 DIAGNOSIS — Z23 Encounter for immunization: Secondary | ICD-10-CM | POA: Diagnosis not present

## 2020-10-16 DIAGNOSIS — I2510 Atherosclerotic heart disease of native coronary artery without angina pectoris: Secondary | ICD-10-CM | POA: Diagnosis not present

## 2020-10-16 DIAGNOSIS — N184 Chronic kidney disease, stage 4 (severe): Secondary | ICD-10-CM | POA: Diagnosis not present

## 2020-11-06 ENCOUNTER — Emergency Department (HOSPITAL_COMMUNITY): Payer: PPO

## 2020-11-06 ENCOUNTER — Inpatient Hospital Stay (HOSPITAL_COMMUNITY)
Admission: EM | Admit: 2020-11-06 | Discharge: 2020-11-10 | DRG: 641 | Disposition: A | Discharge status: Home / Self Care | Payer: PPO | Attending: Internal Medicine | Admitting: Internal Medicine

## 2020-11-06 ENCOUNTER — Other Ambulatory Visit: Payer: Self-pay

## 2020-11-06 DIAGNOSIS — I4510 Unspecified right bundle-branch block: Secondary | ICD-10-CM | POA: Diagnosis not present

## 2020-11-06 DIAGNOSIS — E860 Dehydration: Principal | ICD-10-CM | POA: Diagnosis present

## 2020-11-06 DIAGNOSIS — N183 Chronic kidney disease, stage 3 unspecified: Secondary | ICD-10-CM | POA: Diagnosis not present

## 2020-11-06 DIAGNOSIS — Z794 Long term (current) use of insulin: Secondary | ICD-10-CM

## 2020-11-06 DIAGNOSIS — R531 Weakness: Secondary | ICD-10-CM | POA: Diagnosis not present

## 2020-11-06 DIAGNOSIS — E1165 Type 2 diabetes mellitus with hyperglycemia: Secondary | ICD-10-CM | POA: Diagnosis not present

## 2020-11-06 DIAGNOSIS — E1122 Type 2 diabetes mellitus with diabetic chronic kidney disease: Secondary | ICD-10-CM | POA: Diagnosis present

## 2020-11-06 DIAGNOSIS — R0902 Hypoxemia: Secondary | ICD-10-CM | POA: Diagnosis not present

## 2020-11-06 DIAGNOSIS — D72829 Elevated white blood cell count, unspecified: Secondary | ICD-10-CM | POA: Diagnosis present

## 2020-11-06 DIAGNOSIS — I10 Essential (primary) hypertension: Secondary | ICD-10-CM | POA: Diagnosis not present

## 2020-11-06 DIAGNOSIS — N179 Acute kidney failure, unspecified: Secondary | ICD-10-CM | POA: Diagnosis present

## 2020-11-06 DIAGNOSIS — I5042 Chronic combined systolic (congestive) and diastolic (congestive) heart failure: Secondary | ICD-10-CM | POA: Diagnosis not present

## 2020-11-06 DIAGNOSIS — E1142 Type 2 diabetes mellitus with diabetic polyneuropathy: Secondary | ICD-10-CM | POA: Diagnosis not present

## 2020-11-06 DIAGNOSIS — I2510 Atherosclerotic heart disease of native coronary artery without angina pectoris: Secondary | ICD-10-CM | POA: Diagnosis not present

## 2020-11-06 DIAGNOSIS — Z89412 Acquired absence of left great toe: Secondary | ICD-10-CM | POA: Diagnosis not present

## 2020-11-06 DIAGNOSIS — Y92239 Unspecified place in hospital as the place of occurrence of the external cause: Secondary | ICD-10-CM | POA: Diagnosis present

## 2020-11-06 DIAGNOSIS — Z951 Presence of aortocoronary bypass graft: Secondary | ICD-10-CM

## 2020-11-06 DIAGNOSIS — Z20822 Contact with and (suspected) exposure to covid-19: Secondary | ICD-10-CM | POA: Diagnosis present

## 2020-11-06 DIAGNOSIS — Z8249 Family history of ischemic heart disease and other diseases of the circulatory system: Secondary | ICD-10-CM

## 2020-11-06 DIAGNOSIS — Z7902 Long term (current) use of antithrombotics/antiplatelets: Secondary | ICD-10-CM

## 2020-11-06 DIAGNOSIS — N189 Chronic kidney disease, unspecified: Secondary | ICD-10-CM | POA: Insufficient documentation

## 2020-11-06 DIAGNOSIS — E039 Hypothyroidism, unspecified: Secondary | ICD-10-CM | POA: Diagnosis not present

## 2020-11-06 DIAGNOSIS — E785 Hyperlipidemia, unspecified: Secondary | ICD-10-CM | POA: Diagnosis present

## 2020-11-06 DIAGNOSIS — D631 Anemia in chronic kidney disease: Secondary | ICD-10-CM | POA: Diagnosis present

## 2020-11-06 DIAGNOSIS — I959 Hypotension, unspecified: Secondary | ICD-10-CM | POA: Diagnosis present

## 2020-11-06 DIAGNOSIS — I130 Hypertensive heart and chronic kidney disease with heart failure and stage 1 through stage 4 chronic kidney disease, or unspecified chronic kidney disease: Secondary | ICD-10-CM | POA: Diagnosis not present

## 2020-11-06 DIAGNOSIS — I517 Cardiomegaly: Secondary | ICD-10-CM | POA: Diagnosis not present

## 2020-11-06 DIAGNOSIS — Z79899 Other long term (current) drug therapy: Secondary | ICD-10-CM | POA: Diagnosis not present

## 2020-11-06 DIAGNOSIS — R509 Fever, unspecified: Secondary | ICD-10-CM | POA: Diagnosis present

## 2020-11-06 DIAGNOSIS — M5137 Other intervertebral disc degeneration, lumbosacral region: Secondary | ICD-10-CM | POA: Diagnosis not present

## 2020-11-06 DIAGNOSIS — N184 Chronic kidney disease, stage 4 (severe): Secondary | ICD-10-CM | POA: Diagnosis present

## 2020-11-06 DIAGNOSIS — J9811 Atelectasis: Secondary | ICD-10-CM | POA: Diagnosis not present

## 2020-11-06 DIAGNOSIS — Z83438 Family history of other disorder of lipoprotein metabolism and other lipidemia: Secondary | ICD-10-CM

## 2020-11-06 DIAGNOSIS — Z7982 Long term (current) use of aspirin: Secondary | ICD-10-CM | POA: Diagnosis not present

## 2020-11-06 DIAGNOSIS — R0689 Other abnormalities of breathing: Secondary | ICD-10-CM | POA: Diagnosis not present

## 2020-11-06 DIAGNOSIS — T502X5A Adverse effect of carbonic-anhydrase inhibitors, benzothiadiazides and other diuretics, initial encounter: Secondary | ICD-10-CM | POA: Diagnosis not present

## 2020-11-06 DIAGNOSIS — I499 Cardiac arrhythmia, unspecified: Secondary | ICD-10-CM | POA: Diagnosis not present

## 2020-11-06 DIAGNOSIS — Z87891 Personal history of nicotine dependence: Secondary | ICD-10-CM

## 2020-11-06 DIAGNOSIS — R600 Localized edema: Secondary | ICD-10-CM | POA: Diagnosis not present

## 2020-11-06 DIAGNOSIS — K8020 Calculus of gallbladder without cholecystitis without obstruction: Secondary | ICD-10-CM | POA: Diagnosis not present

## 2020-11-06 DIAGNOSIS — M160 Bilateral primary osteoarthritis of hip: Secondary | ICD-10-CM | POA: Diagnosis not present

## 2020-11-06 DIAGNOSIS — Z79890 Hormone replacement therapy (postmenopausal): Secondary | ICD-10-CM

## 2020-11-06 DIAGNOSIS — E1151 Type 2 diabetes mellitus with diabetic peripheral angiopathy without gangrene: Secondary | ICD-10-CM | POA: Diagnosis not present

## 2020-11-06 DIAGNOSIS — M1611 Unilateral primary osteoarthritis, right hip: Secondary | ICD-10-CM | POA: Diagnosis present

## 2020-11-06 DIAGNOSIS — M4317 Spondylolisthesis, lumbosacral region: Secondary | ICD-10-CM | POA: Diagnosis not present

## 2020-11-06 DIAGNOSIS — N400 Enlarged prostate without lower urinary tract symptoms: Secondary | ICD-10-CM | POA: Diagnosis present

## 2020-11-06 DIAGNOSIS — Z833 Family history of diabetes mellitus: Secondary | ICD-10-CM

## 2020-11-06 HISTORY — DX: Chronic kidney disease, unspecified: N18.9

## 2020-11-06 LAB — COMPREHENSIVE METABOLIC PANEL
ALT: 14 U/L (ref 0–44)
AST: 24 U/L (ref 15–41)
Albumin: 3.5 g/dL (ref 3.5–5.0)
Alkaline Phosphatase: 70 U/L (ref 38–126)
Anion gap: 13 (ref 5–15)
BUN: 73 mg/dL — ABNORMAL HIGH (ref 8–23)
CO2: 24 mmol/L (ref 22–32)
Calcium: 9 mg/dL (ref 8.9–10.3)
Chloride: 98 mmol/L (ref 98–111)
Creatinine, Ser: 3.54 mg/dL — ABNORMAL HIGH (ref 0.61–1.24)
GFR, Estimated: 18 mL/min — ABNORMAL LOW (ref >60)
Glucose, Bld: 193 mg/dL — ABNORMAL HIGH (ref 70–99)
Potassium: 5.1 mmol/L (ref 3.5–5.1)
Sodium: 135 mmol/L (ref 135–145)
Total Bilirubin: 1.3 mg/dL — ABNORMAL HIGH (ref 0.3–1.2)
Total Protein: 7.8 g/dL (ref 6.5–8.1)

## 2020-11-06 LAB — CBC
HCT: 39.7 % (ref 39.0–52.0)
Hemoglobin: 12.8 g/dL — ABNORMAL LOW (ref 13.0–17.0)
MCH: 28.6 pg (ref 26.0–34.0)
MCHC: 32.2 g/dL (ref 30.0–36.0)
MCV: 88.6 fL (ref 80.0–100.0)
Platelets: 178 10*3/uL (ref 150–400)
RBC: 4.48 MIL/uL (ref 4.22–5.81)
RDW: 13.2 % (ref 11.5–15.5)
WBC: 12.9 10*3/uL — ABNORMAL HIGH (ref 4.0–10.5)
nRBC: 0 % (ref 0.0–0.2)

## 2020-11-06 LAB — RESP PANEL BY RT-PCR (FLU A&B, COVID) ARPGX2
Influenza A by PCR: NEGATIVE
Influenza B by PCR: NEGATIVE
SARS Coronavirus 2 by RT PCR: NEGATIVE

## 2020-11-06 LAB — LACTIC ACID, PLASMA: Lactic Acid, Venous: 1.7 mmol/L (ref 0.5–1.9)

## 2020-11-06 LAB — GLUCOSE, CAPILLARY: Glucose-Capillary: 217 mg/dL — ABNORMAL HIGH (ref 70–99)

## 2020-11-06 IMAGING — CT CT ABD-PELV W/O CM
2 of 4 series · 16 of 46 positions shown, 18 images · non-contrast
Comparison: [DATE]

CLINICAL DATA: Generalized abdominal pain

EXAM:
CT ABDOMEN AND PELVIS WITHOUT CONTRAST
TECHNIQUE: Multidetector CT imaging of the abdomen and pelvis was performed
following the standard protocol without IV contrast.

[Series 3: ap without · axial · non-contrast · 0.75mm/px · z∈[-400,+30]mm · 13 of 98 slices shown, 15 images]
[im 6/98  soft-tissue]
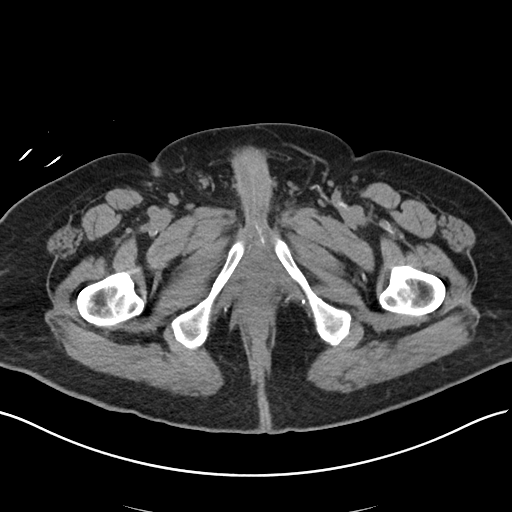
[im 6/98  bone]
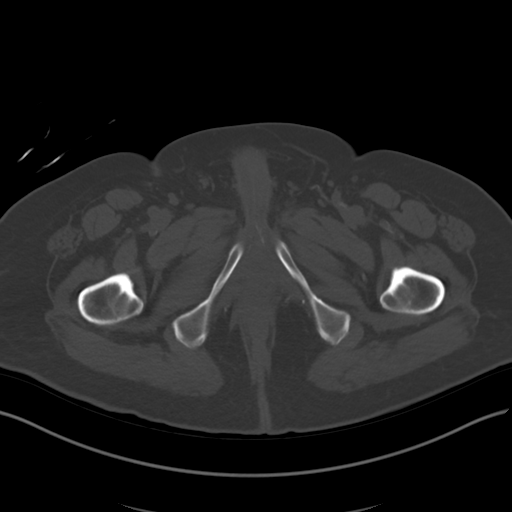
[im 11/98  soft-tissue]
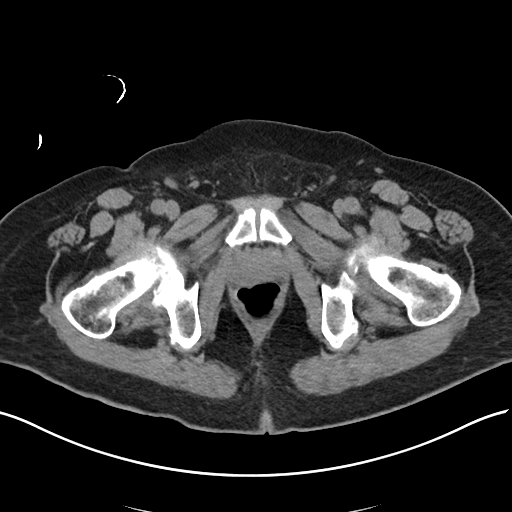
[im 22/98  soft-tissue]
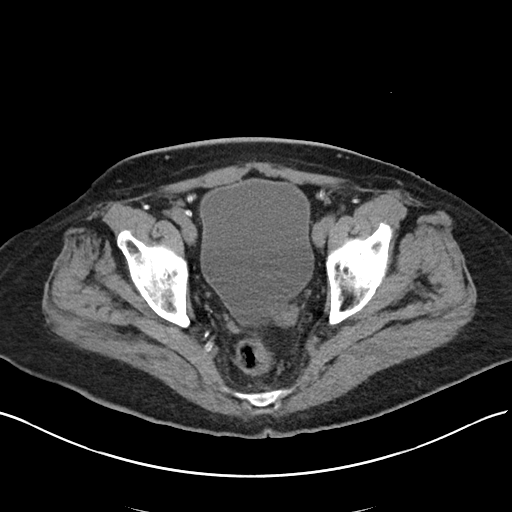
[im 27/98  soft-tissue]
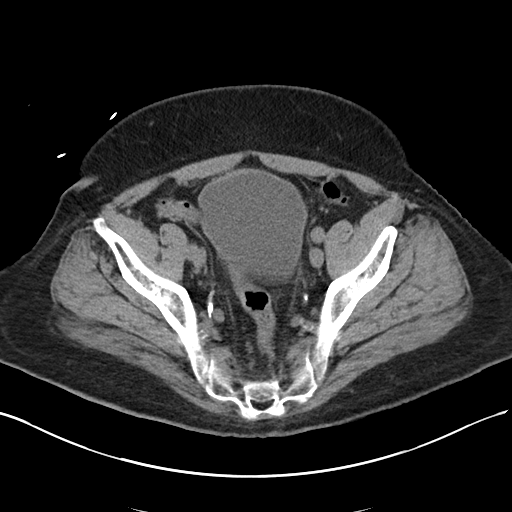
[im 33/98  soft-tissue]
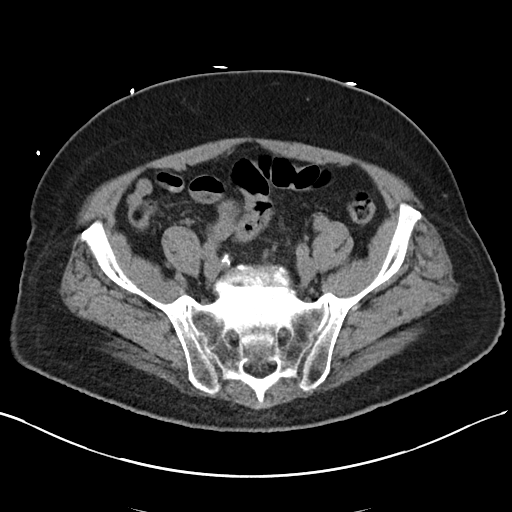
[im 44/98  soft-tissue]
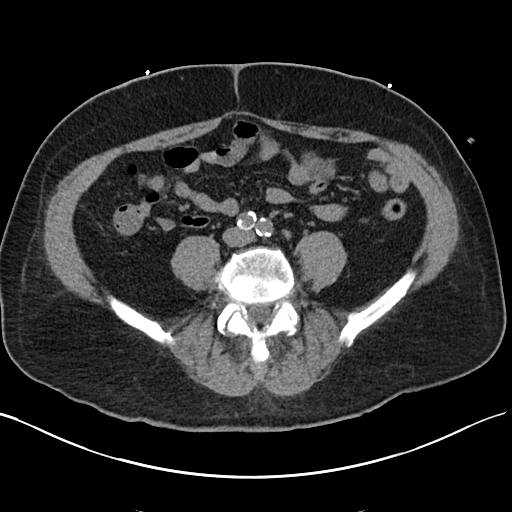
[im 49/98  soft-tissue]
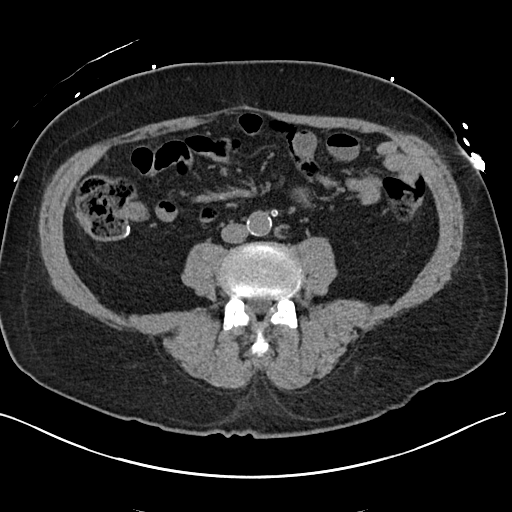
[im 54/98  soft-tissue]
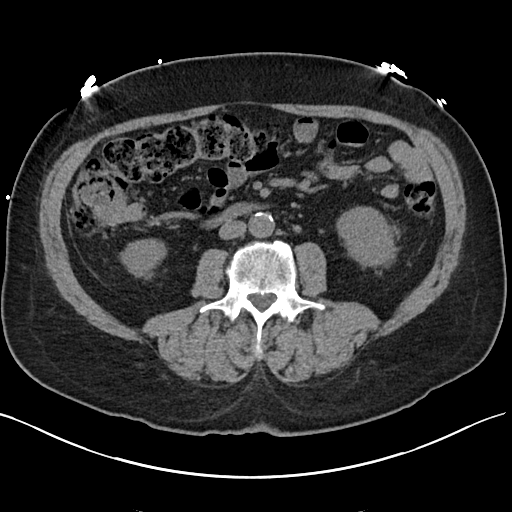
[im 65/98  soft-tissue]
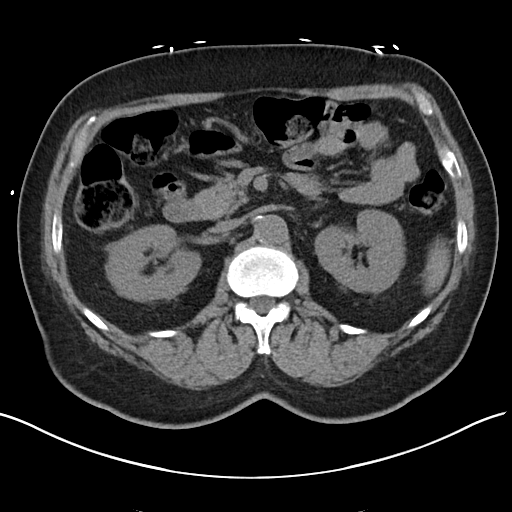
[im 65/98  bone]
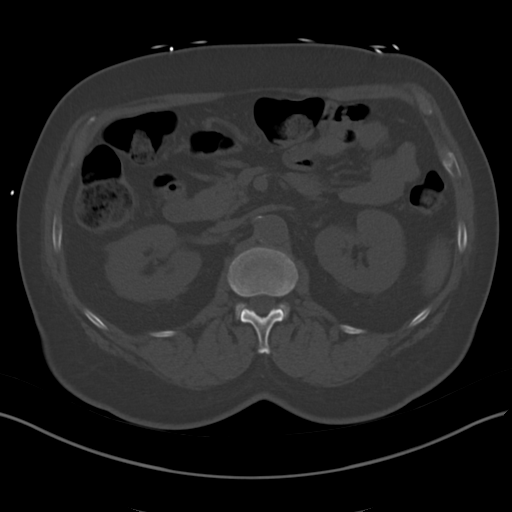
[im 71/98  soft-tissue]
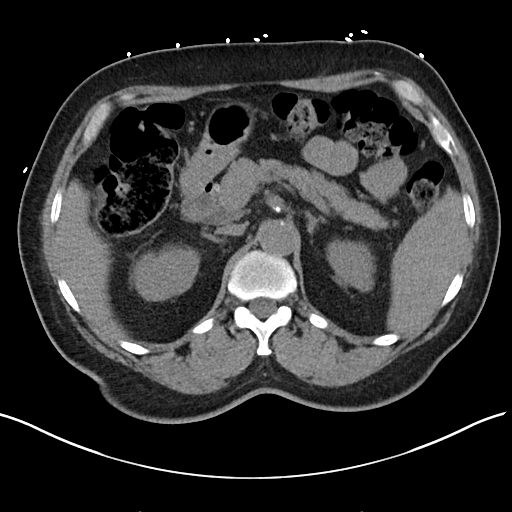
[im 76/98  soft-tissue]
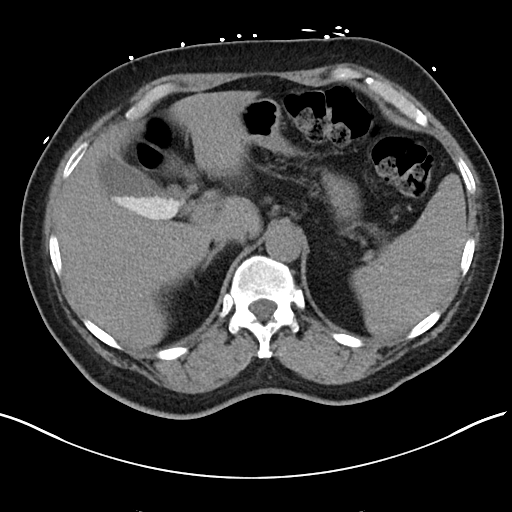
[im 87/98  soft-tissue]
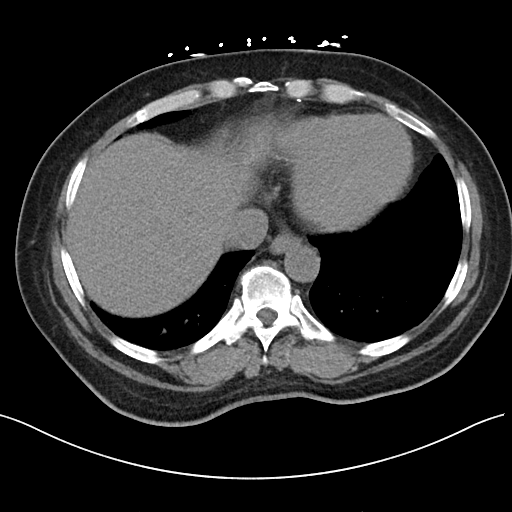
[im 92/98  soft-tissue]
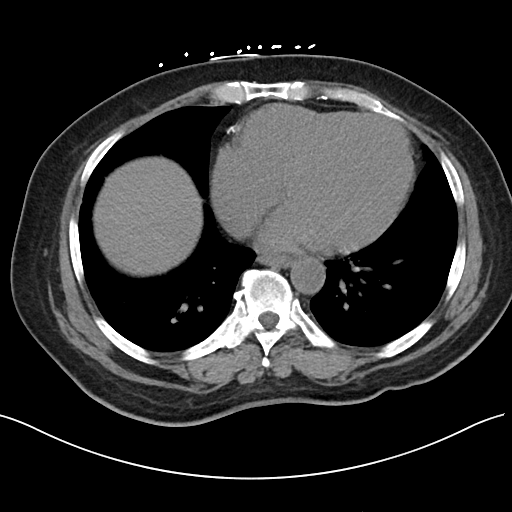

[Series 6: cor · coronal · 0.89mm/px · 3 of 89 slices shown]
[im 30/89  soft-tissue]
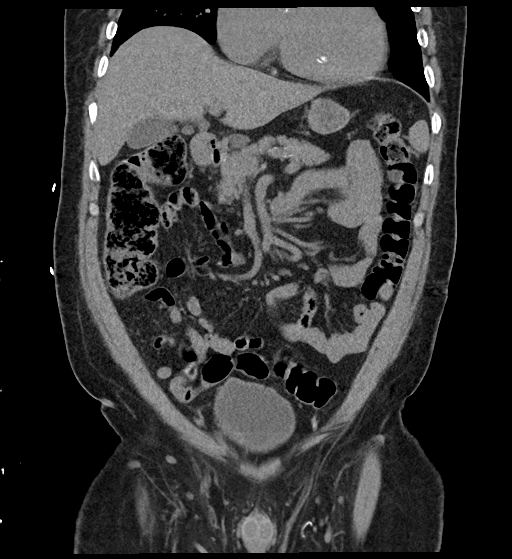
[im 40/89  soft-tissue]
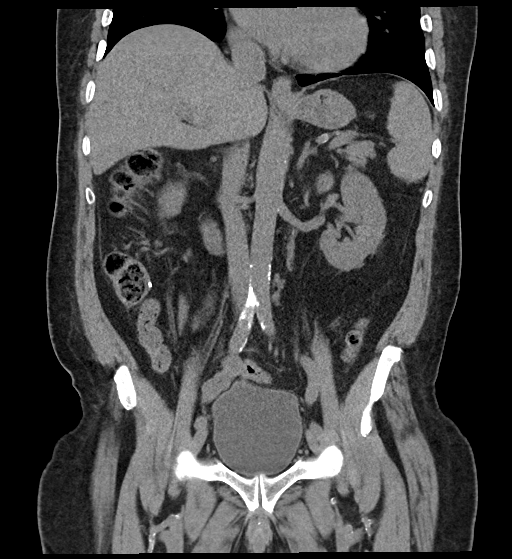
[im 49/89  soft-tissue]
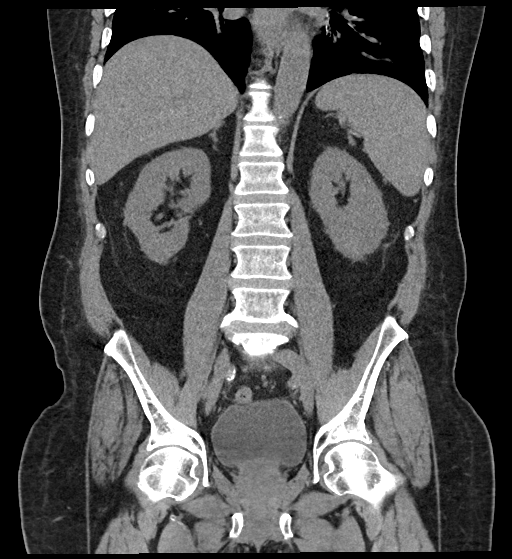

[16 of 46 positions shown; findings below may reference images not displayed]

FINDINGS: Lower chest: 5 mm nodule within the periphery of the left lower lobe
is stable from prior. Included lung bases are otherwise clear. Heart
size is mildly enlarged.

Hepatobiliary: Layering stones and sludge within the gallbladder
lumen. No pericholecystic inflammatory changes by CT. Unremarkable
unenhanced appearance of the liver. No biliary dilatation.

Pancreas: Unremarkable. No pancreatic ductal dilatation or
surrounding inflammatory changes.

Spleen: Normal in size without focal abnormality.

Adrenals/Urinary Tract: Adrenal glands are unremarkable. Kidneys are
normal, without renal calculi, focal lesion, or hydronephrosis.
Bladder is unremarkable.

Stomach/Bowel: Stomach is within normal limits. Appendix is
surgically absent. No evidence of bowel wall thickening, distention,
or inflammatory changes.

Vascular/Lymphatic: Atherosclerosis throughout the aortoiliac axis.
No aneurysm. No abdominopelvic lymphadenopathy.

Reproductive: Prostate is unremarkable.

Other: No free fluid. No abdominopelvic fluid collection. No
pneumoperitoneum. No abdominal wall hernia.

Musculoskeletal: Chronic bilateral L5 pars interarticularis defects
with grade 1 anterolisthesis of L5 on S1. There is degenerative disc
disease at the L5-S1 level. Bilateral hip arthropathy, right worse
than left.
IMPRESSION: 1. No acute abdominopelvic findings.
2. Cholelithiasis without evidence of acute cholecystitis.
3. Chronic bilateral L5 pars interarticularis defects with grade 1
anterolisthesis of L5 on S1.

Aortic Atherosclerosis ([39]-[39]).

## 2020-11-06 IMAGING — DX DG CHEST 1V PORT
1 series · 1 of 1 positions shown · non-contrast
Comparison: Portable exam [JH] hours compared to [DATE]

CLINICAL DATA: Weakness, fever

EXAM:
PORTABLE CHEST 1 VIEW

[chest ap]
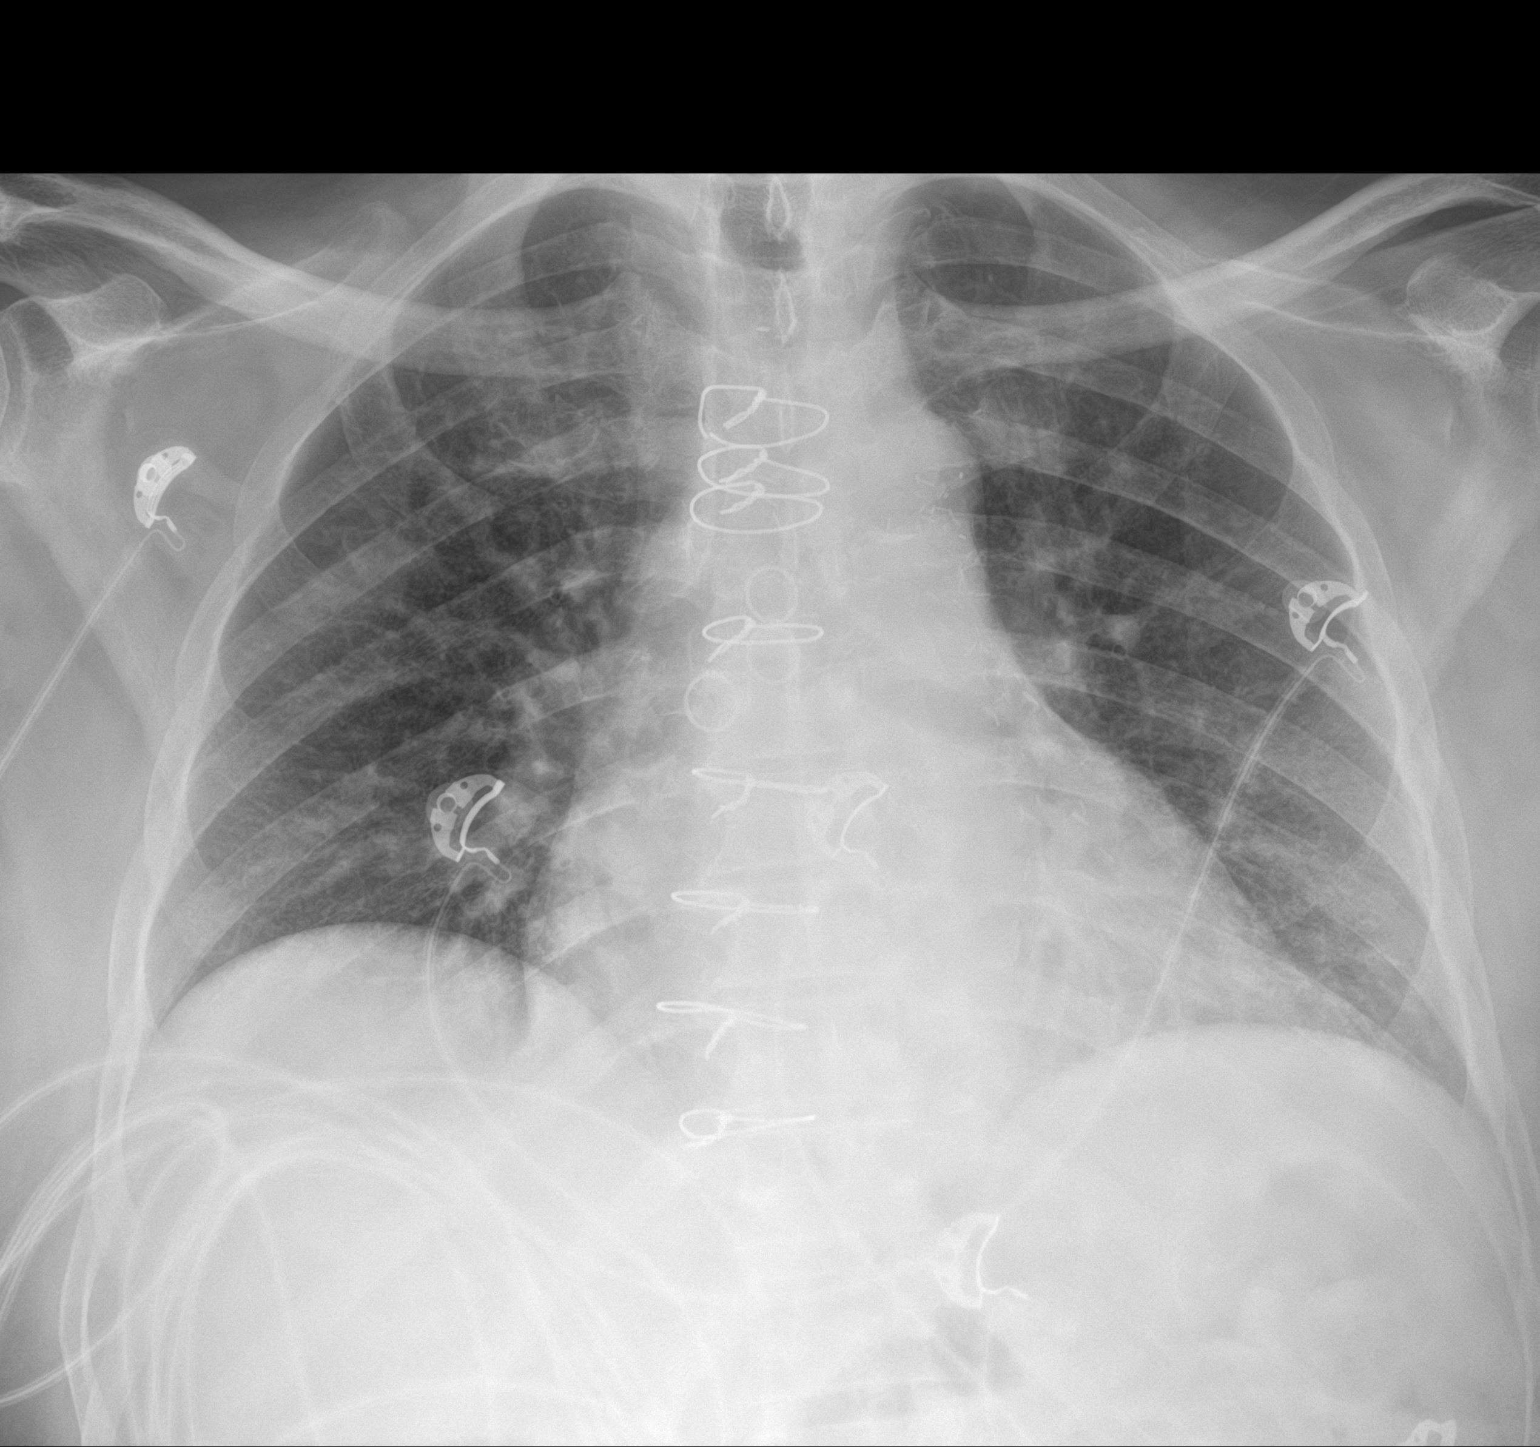

[1 of 1 positions shown; findings below may reference images not displayed]

FINDINGS: Borderline enlargement of cardiac silhouette post CABG.

Mediastinal contours and pulmonary vascularity normal.

Minimal bibasilar atelectasis.

Lungs otherwise clear.

No acute infiltrate, pleural effusion, or pneumothorax
IMPRESSION: Post CABG.

Minimal bibasilar atelectasis.

## 2020-11-06 MED ORDER — SODIUM CHLORIDE 0.9 % IV BOLUS
1000.0000 mL | Freq: Once | INTRAVENOUS | Status: AC
  Administered 2020-11-06: 1000 mL via INTRAVENOUS

## 2020-11-06 MED ORDER — INSULIN ASPART 100 UNIT/ML IJ SOLN
0.0000 [IU] | Freq: Three times a day (TID) | INTRAMUSCULAR | Status: DC
  Administered 2020-11-07: 2 [IU] via SUBCUTANEOUS
  Administered 2020-11-07: 3 [IU] via SUBCUTANEOUS
  Administered 2020-11-07: 2 [IU] via SUBCUTANEOUS
  Administered 2020-11-08 (×3): 1 [IU] via SUBCUTANEOUS
  Administered 2020-11-09: 2 [IU] via SUBCUTANEOUS
  Administered 2020-11-09: 3 [IU] via SUBCUTANEOUS
  Administered 2020-11-10 (×2): 1 [IU] via SUBCUTANEOUS

## 2020-11-06 MED ORDER — ONDANSETRON HCL 4 MG/2ML IJ SOLN: 4.0000 mg | Freq: Four times a day (QID) | INTRAMUSCULAR | Status: DC | PRN

## 2020-11-06 MED ORDER — SODIUM CHLORIDE 0.9 % IV SOLN: INTRAVENOUS | Status: DC

## 2020-11-06 MED ORDER — GABAPENTIN 300 MG PO CAPS
300.0000 mg | ORAL_CAPSULE | Freq: Two times a day (BID) | ORAL | Status: DC
  Administered 2020-11-06 – 2020-11-10 (×8): 300 mg via ORAL
  Filled 2020-11-06 (×8): qty 1

## 2020-11-06 MED ORDER — ACETAMINOPHEN 500 MG PO TABS
1000.0000 mg | ORAL_TABLET | Freq: Four times a day (QID) | ORAL | Status: DC | PRN
  Administered 2020-11-07 (×2): 1000 mg via ORAL
  Filled 2020-11-06 (×2): qty 2

## 2020-11-06 MED ORDER — HEPARIN SODIUM (PORCINE) 5000 UNIT/ML IJ SOLN
5000.0000 [IU] | Freq: Three times a day (TID) | INTRAMUSCULAR | Status: DC
  Administered 2020-11-06 – 2020-11-10 (×11): 5000 [IU] via SUBCUTANEOUS
  Filled 2020-11-06 (×11): qty 1

## 2020-11-06 MED ORDER — INSULIN ASPART 100 UNIT/ML IJ SOLN
0.0000 [IU] | Freq: Every day | INTRAMUSCULAR | Status: DC
  Administered 2020-11-06: 2 [IU] via SUBCUTANEOUS

## 2020-11-06 MED ORDER — TAMSULOSIN HCL 0.4 MG PO CAPS
0.4000 mg | ORAL_CAPSULE | Freq: Every day | ORAL | Status: DC
  Administered 2020-11-06 – 2020-11-10 (×5): 0.4 mg via ORAL
  Filled 2020-11-06 (×5): qty 1

## 2020-11-06 MED ORDER — ASPIRIN EC 81 MG PO TBEC
81.0000 mg | DELAYED_RELEASE_TABLET | Freq: Every day | ORAL | Status: DC
  Administered 2020-11-07 – 2020-11-10 (×4): 81 mg via ORAL
  Filled 2020-11-06 (×4): qty 1

## 2020-11-06 MED ORDER — LEVOTHYROXINE SODIUM 25 MCG PO TABS
25.0000 ug | ORAL_TABLET | Freq: Every day | ORAL | Status: DC
  Administered 2020-11-07 – 2020-11-10 (×4): 25 ug via ORAL
  Filled 2020-11-06 (×4): qty 1

## 2020-11-06 MED ORDER — CLOPIDOGREL BISULFATE 75 MG PO TABS
75.0000 mg | ORAL_TABLET | Freq: Every day | ORAL | Status: DC
  Administered 2020-11-06 – 2020-11-10 (×5): 75 mg via ORAL
  Filled 2020-11-06 (×5): qty 1

## 2020-11-06 MED ORDER — ONDANSETRON HCL 4 MG PO TABS: 4.0000 mg | ORAL_TABLET | Freq: Four times a day (QID) | ORAL | Status: DC | PRN

## 2020-11-06 MED ORDER — VITAMIN B-12 1000 MCG PO TABS
500.0000 ug | ORAL_TABLET | Freq: Every day | ORAL | Status: DC
  Administered 2020-11-06 – 2020-11-10 (×5): 500 ug via ORAL
  Filled 2020-11-06 (×5): qty 1

## 2020-11-06 NOTE — ED Provider Notes (Signed)
[facility] EMERGENCY DEPARTMENT Provider Note   CSN: [id number] Arrival date & time: 11/06/20  1400     History Chief Complaint  Patient presents with  . Fever  . Weakness    Phillip [name] is a 65 y.o. male.  Patient with fever acute onset today. Symptoms acute onset, moderate (103), constant, persistent. Pt unsure of source of fever. States otherwise felt ok today. Denies headache. No neck pain or stiffness. No sinus pain or drainage. No sore throat or cough. No chest pain or sob. While in ED, does note some LLQ pain/tenderness - states was unaware of previously. Denies dysuria, no scrotal or testicle pain or swelling. Denies skin lesions or rash. No extremity pain, redness or swelling. No specific known ill contacts or known covid exposure.   The history is provided by the patient and the EMS personnel.  Fever Associated symptoms: no chest pain, no confusion, no cough, no diarrhea, no dysuria, no headaches, no rash, no sore throat and no vomiting   Weakness Associated symptoms: fever   Associated symptoms: no chest pain, no cough, no diarrhea, no dysuria, no headaches, no shortness of breath and no vomiting        Past Medical History:  Diagnosis Date  . Abdominopelvic abscess ([place]) 09/29/2018  . Abscess of appendix 09/22/2018  . Acquired spondylolisthesis of lumbosacral region 03/20/2020  . Acute kidney injury ([place]) 10/07/2018  . Acute kidney injury superimposed on CKD ([place]) 02/18/2020  . Anemia of chronic disease 08/18/2019  . Anemia, chronic disease 08/18/2019  . Aortic stenosis, moderate 10/15/2019  . Appendicitis 09/08/2018  . ARF (acute renal failure) ([place]) 02/19/2020  . Asthma   . Atherosclerosis of native arteries of the extremities with ulceration ([place]) 02/24/2018   Formatting of this note might be different from the original. Last Assessment & Plan:  His ABIs today are 1.07 on the right and 0.99 on the left with biphasic waveforms.  Although these  pressures may be somewhat elevated from medial calcification, his flow does appear to be reasonably good.  His left ABI was 0.58 prior to intervention. At this point, we will stretch out his follow-up and see him b  . Benign hypertension with CKD (chronic kidney disease) stage IV ([place]) 02/24/2018   Last Assessment & Plan:  blood pressure control important in reducing the progression of atherosclerotic disease. On appropriate oral medications.  . Benign prostatic hyperplasia without lower urinary tract symptoms 01/14/2018  . Bilateral lower extremity edema 11/04/2018  . Bradycardia 03/25/2018  . Bruit of right carotid artery 07/26/2015  . CAD (coronary artery disease) 07/26/2015   Formatting of this note might be different from the original. [name]  Formatting of this note might be different from the original. [name]  . Cardiac murmur 07/26/2015  . CHF (congestive heart failure) ([place])   . Chronic combined systolic and diastolic CHF (congestive heart failure) ([place]) 07/26/2015   Formatting of this note might be different from the original. revenkar EF 35%  . Chronic idiopathic constipation 11/02/2018  . Chronic low back pain without sciatica 12/02/2018  . Chronic pain of right hip 03/31/2020  . Chronic systolic heart failure ([place]) 07/26/2015   Formatting of this note might be different from the original. revenkar EF 35%  . CKD (chronic kidney disease) stage 4, GFR 15-29 ml/min ([place]) 10/07/2018  . Coronary artery disease   . Diabetes mellitus without complication ([place])   . Dysphagia 07/26/2015  . Erectile dysfunction 07/14/2017  . Foot ulcer (  [place])    left foot  . GERD (gastroesophageal reflux disease)   . Heart murmur   . Hematuria 07/27/2015  . High risk medication use 07/26/2015  . Hyperkalemia 05/29/2020  . Hyperlipidemia 07/26/2015  . Hyperlipidemia associated with type 2 diabetes mellitus ([place]) 02/18/2020  . Hypertension   . Hypertension associated with diabetes ([place])   . Hypoglycemia 05/07/2019  .  Hypothyroidism 11/14/2015  . Hypothyroidism (acquired) 11/14/2015  . Insomnia 11/02/2018  . Kidney insufficiency   . Lumbar spondylosis 03/20/2020  . Malaise and fatigue 03/25/2018  . Mild aortic stenosis 05/12/2019  . Mitral regurgitation 05/12/2019  . Moderate aortic regurgitation 05/12/2019  . Non-compliance 05/05/2019  . Osseous and subluxation stenosis of intervertebral foramina of lumbar region 03/20/2020  . Peripheral vascular disease ([place])   . Polyneuropathy in diabetes ([place]) 05/07/2019  . Primary osteoarthritis involving multiple joints 10/06/2019  . Primary osteoarthritis of right hip 02/21/2020  . Puncture wound of right hip 04/02/2018  . PVD (peripheral vascular disease) with claudication ([place]) 07/26/2015   Angioplasty 02/2018  . Regional wall motion abnormality of heart 05/07/2019  . Right groin pain 02/18/2020  . Screening for colon cancer 02/18/2018  . Sleep apnea 07/26/2015  . Subacute osteomyelitis of left foot ([place]) 03/25/2018  . Type 2 diabetes mellitus with stage 3 chronic kidney disease, with long-term current use of insulin ([place]) 07/26/2015  . Type 2 diabetes mellitus with stage 4 chronic kidney disease, with long-term current use of insulin ([place]) 07/26/2015  . Vitamin B12 deficiency 06/16/2019  . Vitamin D deficiency 05/07/2018    Patient Active Problem List   Diagnosis Date Noted  . Hyperkalemia 05/29/2020  . Chronic pain of right hip 03/31/2020  . Acquired spondylolisthesis of lumbosacral region 03/20/2020  . Lumbar spondylosis 03/20/2020  . Osseous and subluxation stenosis of intervertebral foramina of lumbar region 03/20/2020  . Foot ulcer ([place])   . Hypertension   . Primary osteoarthritis of right hip 02/21/2020  . ARF (acute renal failure) ([place]) 02/19/2020  . Acute kidney injury superimposed on CKD ([place]) 02/18/2020  . Hyperlipidemia associated with type 2 diabetes mellitus ([place]) 02/18/2020  . Right groin pain 02/18/2020  . Asthma   . Coronary artery disease   .  Diabetes mellitus without complication ([place])   . GERD (gastroesophageal reflux disease)   . Heart murmur   . Hypertension associated with diabetes ([place])   . Kidney insufficiency   . Peripheral vascular disease ([place])   . Aortic stenosis, moderate 10/15/2019  . Primary osteoarthritis involving multiple joints 10/06/2019  . Anemia of chronic disease 08/18/2019  . Anemia, chronic disease 08/18/2019  . Vitamin B12 deficiency 06/16/2019  . Mild aortic stenosis 05/12/2019  . Moderate aortic regurgitation 05/12/2019  . Mitral regurgitation 05/12/2019  . Hypoglycemia 05/07/2019  . Polyneuropathy in diabetes ([place]) 05/07/2019  . Regional wall motion abnormality of heart 05/07/2019  . Non-compliance 05/05/2019  . Chronic low back pain without sciatica 12/02/2018  . Bilateral lower extremity edema 11/04/2018  . Chronic idiopathic constipation 11/02/2018  . Insomnia 11/02/2018  . Acute kidney injury ([place]) 10/07/2018  . CKD (chronic kidney disease) stage 4, GFR 15-29 ml/min (HCC) 10/07/2018  . Abdominopelvic abscess ([place]) 09/29/2018  . Abscess of appendix 09/22/2018  . Appendicitis 09/08/2018  . Vitamin D deficiency 05/07/2018  . Puncture wound of right hip 04/02/2018  . Bradycardia 03/25/2018  . Malaise and fatigue 03/25/2018  . Subacute osteomyelitis of left foot ([place]) 03/25/2018  . Atherosclerosis of native arteries of the extremities  with ulceration ([place]) 02/24/2018  . Benign hypertension with CKD (chronic kidney disease) stage IV ([place]) 02/24/2018  . Screening for colon cancer 02/18/2018  . Benign prostatic hyperplasia without lower urinary tract symptoms 01/14/2018  . Erectile dysfunction 07/14/2017  . Hypothyroidism 11/14/2015  . Hypothyroidism (acquired) 11/14/2015  . Hematuria 07/27/2015  . Type 2 diabetes mellitus with stage 3 chronic kidney disease, with long-term current use of insulin ([place]) 07/26/2015  . PVD (peripheral vascular disease) with claudication ([place]) 07/26/2015  .  CHF (congestive heart failure) ([place]) 07/26/2015  . Bruit of right carotid artery 07/26/2015  . CAD (coronary artery disease) 07/26/2015  . Cardiac murmur 07/26/2015  . Dysphagia 07/26/2015  . High risk medication use 07/26/2015  . Hyperlipidemia 07/26/2015  . Sleep apnea 07/26/2015  . Chronic combined systolic and diastolic CHF (congestive heart failure) ([place]) 07/26/2015  . Type 2 diabetes mellitus with stage 4 chronic kidney disease, with long-term current use of insulin ([place]) 07/26/2015  . Chronic systolic heart failure ([place]) 07/26/2015    Past Surgical History:  Procedure Laterality Date  . AMPUTATION TOE Left 03/13/2018   Procedure: AMPUTATION TOE-MPJ;  Surgeon: Samara Deist, DPM;  Location: [facility] ORS;  Service: Podiatry;  Laterality: Left;  . ANGIOPLASTY    . CARDIAC CATHETERIZATION    . CARDIAC SURGERY     heart bypass (4)  . CORONARY ARTERY BYPASS GRAFT    . LAPAROSCOPIC APPENDECTOMY N/A 09/08/2018   Procedure: APPENDECTOMY LAPAROSCOPIC;  Surgeon: Jules Husbands, MD;  Location: [facility] ORS;  Service: General;  Laterality: N/A;  . LOWER EXTREMITY ANGIOGRAPHY Left 03/02/2018   Procedure: LOWER EXTREMITY ANGIOGRAPHY;  Surgeon: Algernon Huxley, MD;  Location: [facility];  Service: Cardiovascular;  Laterality: Left;  . ROTATOR CUFF REPAIR Left        Family History  Problem Relation Age of Onset  . Hyperlipidemia Mother   . Hypertension Mother   . Diabetes Mother   . Heart attack Brother   . Heart disease Brother   . Lung disease Father     Social History   Tobacco Use  . Smoking status: Former Smoker    Quit date: 2003    Years since quitting: 19.4  . Smokeless tobacco: Never Used  Vaping Use  . Vaping Use: Never used  Substance Use Topics  . Alcohol use: Not Currently    Comment: 2003  . Drug use: Not Currently    Types: Marijuana    Home Medications Prior to Admission medications   Medication Sig Start Date End Date Taking? Authorizing Provider   acetaminophen (TYLENOL) 500 MG tablet Take 1,000 mg by mouth every 6 (six) hours as needed for moderate pain or headache.     [provider]  aspirin 81 MG tablet Take 81 mg by mouth daily.    [provider]  bumetanide (BUMEX) 1 MG tablet Take 1 mg by mouth daily. 03/24/20   [provider]  carvedilol (COREG) 6.25 MG tablet Take 2 tablets (12.5 mg total) by mouth 2 (two) times daily with a meal. 02/22/20 03/23/20  Shahmehdi, Valeria Batman, MD  cloNIDine (CATAPRES) 0.3 MG tablet Take 0.3 mg by mouth 2 (two) times daily.     [provider]  clopidogrel (PLAVIX) 75 MG tablet Take 1 tablet (75 mg total) by mouth daily. 03/02/18   Algernon Huxley, MD  gabapentin (NEURONTIN) 300 MG capsule Take 300 mg by mouth 2 (two) times daily.    [provider]  isosorbide  mononitrate (IMDUR) 120 MG 24 hr tablet Take 120 mg by mouth daily.    [provider]  LEVEMIR FLEXTOUCH 100 UNIT/ML FlexPen Inject 20 Units into the skin at bedtime. 02/22/20   Shahmehdi, Valeria Batman, MD  levothyroxine (SYNTHROID) 25 MCG tablet Take 25 mcg by mouth daily.  10/09/18   [provider]  lovastatin (MEVACOR) 40 MG tablet Take 40 mg by mouth at bedtime.    [provider]  metolazone (ZAROXOLYN) 2.5 MG tablet Take 2.5 mg by mouth daily. 04/18/20   [provider]  nitroGLYCERIN (NITROSTAT) 0.4 MG SL tablet Place 0.4 mg under the tongue every 5 (five) minutes as needed for chest pain.     [provider]  tamsulosin (FLOMAX) 0.4 MG CAPS capsule Take 0.4 mg by mouth daily. 04/19/20   [provider]  traMADol (ULTRAM) 50 MG tablet Take 50 mg by mouth every 6 (six) hours as needed. 03/31/20   [provider]  vitamin B-12 (CYANOCOBALAMIN) 500 MCG tablet Take 500 mcg by mouth daily.     [provider]    Allergies    Patient has no known allergies.  Review of Systems   Review of Systems  Constitutional: Positive for fever.   HENT: Negative for sinus pain and sore throat.   Eyes: Negative for pain and redness.  Respiratory: Negative for cough and shortness of breath.   Cardiovascular: Negative for chest pain and leg swelling.  Gastrointestinal: Negative for diarrhea and vomiting.  Genitourinary: Negative for dysuria and flank pain.  Musculoskeletal: Negative for back pain.  Skin: Negative for rash.  Neurological: Positive for weakness. Negative for headaches.  Hematological: Negative for adenopathy.  Psychiatric/Behavioral: Negative for confusion.    Physical Exam Updated Vital Signs BP 119/65 (BP Location: Right Leg)   Pulse 88   Temp 99.5 F (37.5 C) (Oral)   Resp 20   Ht 1.829 m (6')   Wt 91.2 kg   SpO2 96%   BMI 27.26 kg/m   Physical Exam Vitals and nursing note reviewed.  Constitutional:      Appearance: Normal appearance. He is well-developed.  HENT:     Head: Atraumatic.     Nose: Nose normal. No congestion.     Mouth/Throat:     Mouth: Mucous membranes are moist.     Pharynx: Oropharynx is clear.  Eyes:     General: No scleral icterus.    Conjunctiva/sclera: Conjunctivae normal.     Pupils: Pupils are equal, round, and reactive to light.  Neck:     Trachea: No tracheal deviation.     Comments: No stiffness or rigidity.  Cardiovascular:     Rate and Rhythm: Normal rate and regular rhythm.     Pulses: Normal pulses.     Heart sounds: Normal heart sounds. No murmur heard. No friction rub. No gallop.   Pulmonary:     Effort: Pulmonary effort is normal. No accessory muscle usage or respiratory distress.     Breath sounds: Normal breath sounds.  Abdominal:     General: Bowel sounds are normal. There is no distension.     Palpations: Abdomen is soft.     Tenderness: There is abdominal tenderness. There is no guarding.     Comments: LLQ tenderness.   Genitourinary:    Comments: No cva tenderness. Musculoskeletal:        General: No swelling or tenderness.     Cervical back:  Normal range of motion and neck supple. No  rigidity.     Left lower leg: No edema.     Comments: Remote left big toe amputation. No sign of extremity infection noted.   Skin:    General: Skin is warm and dry.     Findings: No rash.  Neurological:     Mental Status: He is alert.     Comments: Alert, speech clear. Motor/sens grossly intact bil.   Psychiatric:        Mood and Affect: Mood normal.     ED Results / Procedures / Treatments   Labs (all labs ordered are listed, but only abnormal results are displayed) Results for orders placed or performed during the hospital encounter of 11/06/20  Lactic acid, plasma  Result Value Ref Range   Lactic Acid, Venous 1.7 0.5 - 1.9 mmol/L  CBC  Result Value Ref Range   WBC 12.9 (H) 4.0 - 10.5 K/uL   RBC 4.48 4.22 - 5.81 MIL/uL   Hemoglobin 12.8 (L) 13.0 - 17.0 g/dL   HCT 39.7 39.0 - 52.0 %   MCV 88.6 80.0 - 100.0 fL   MCH 28.6 26.0 - 34.0 pg   MCHC 32.2 30.0 - 36.0 g/dL   RDW 13.2 11.5 - 15.5 %   Platelets 178 150 - 400 K/uL   nRBC 0.0 0.0 - 0.2 %  Comprehensive metabolic panel  Result Value Ref Range   Sodium 135 135 - 145 mmol/L   Potassium 5.1 3.5 - 5.1 mmol/L   Chloride 98 98 - 111 mmol/L   CO2 24 22 - 32 mmol/L   Glucose, Bld 193 (H) 70 - 99 mg/dL   BUN 73 (H) 8 - 23 mg/dL   Creatinine, Ser 3.54 (H) 0.61 - 1.24 mg/dL   Calcium 9.0 8.9 - 10.3 mg/dL   Total Protein 7.8 6.5 - 8.1 g/dL   Albumin 3.5 3.5 - 5.0 g/dL   AST 24 15 - 41 U/L   ALT 14 0 - 44 U/L   Alkaline Phosphatase 70 38 - 126 U/L   Total Bilirubin 1.3 (H) 0.3 - 1.2 mg/dL   GFR, Estimated 18 (L) >60 mL/min   Anion gap 13 5 - 15    EKG EKG Interpretation  Date/Time:  Monday November 06 2020 14:07:58 EDT Ventricular Rate:  88 PR Interval:  157 QRS Duration: 122 QT Interval:  348 QTC Calculation: 421 R Axis:   60 Text Interpretation: Sinus rhythm LVH with secondary repolarization abnormality Non-specific ST-t changes Confirmed by Lajean Saver [phone]) on  11/06/2020 2:18:05 PM   Radiology No results found.  Procedures Procedures   Medications Ordered in ED Medications - No data to display  ED Course  I have reviewed the triage vital signs and the nursing notes.  Pertinent labs & imaging results that were available during my care of the patient were reviewed by me and considered in my medical decision making (see chart for details).    MDM Rules/Calculators/A&P                          Iv ns. Labs sent. Cxr.   Reviewed nursing notes and prior charts for additional history.   Labs reviewed/interpreted by me - mild AKI on top of CKD. Iv ns bolus. Wbc mildly elevated.   CXR reviewed/interpreted by me - no pna.   CT abd pending.   Signed out to Dr Francia Greaves to check pending labs and CT - likely will required admission given aki.  Final Clinical Impression(s) / ED Diagnoses Final diagnoses:  None    Rx / DC Orders ED Discharge Orders    None       Lajean Saver, MD 11/06/20 1606

## 2020-11-06 NOTE — ED Notes (Signed)
Got patient undressed on the monitor did ekg shown to Dr Ashok Cordia patient is resting with call bell in reach

## 2020-11-06 NOTE — H&P (Signed)
History and Physical    Phillip [name] [id number] DOB: 17-Mar-1956 DOA: 11/06/2020  I have briefly reviewed the patient's prior medical records in [place]  PCP: Raina Mina., MD  Patient coming from: home  Chief Complaint: weakness  HPI: Phillip [name] is a 65 y.o. male with medical history significant of chronic benign systolic and diastolic CHF (EF 99991111, G3 DD by TTE 05/12/2019), CKD stage IV, CAD s/p CABG, PVD, IDT2DM, HTN, HLD, anemia of chronic disease, and hypothyroidism who presents to the ED for unclear reasons per patient-he does state that he went 3 hours on the side of the road this morning but not sure how he got there.  He was taking his 72 year old grandson to his day program when he apparently pulled over, was mumbling, and grandson called for EMS after 3 hours.  Review of the ER chart shows that patient was brought in for weakness and fever.  EMS did state that the patient was in the side of road in his truck for several hours.  Temperature was 103.1 when patient was found.  Patient is difficult historian but does state that he has been having increasing swelling in his lower extremity and has been taking increasing number of Bumex per day per his PCPs advice.  He says his nephrologist tells him to take 1 mg daily but his PCP has increased to 2 mg twice daily.  In the ER patient has received IV fluids and begun to feel better.  Patient still has not urinated since this AM.  Patient also had CT scan of his abdomen pelvis without findings.   Review of Systems: As per HPI otherwise 10 point review of systems negative.   Past Medical History:  Diagnosis Date  . Abdominopelvic abscess ([place]) 09/29/2018  . Abscess of appendix 09/22/2018  . Acquired spondylolisthesis of lumbosacral region 03/20/2020  . Acute kidney injury ([place]) 10/07/2018  . Acute kidney injury superimposed on CKD ([place]) 02/18/2020  . Anemia of chronic disease 08/18/2019  . Anemia, chronic disease  08/18/2019  . Aortic stenosis, moderate 10/15/2019  . Appendicitis 09/08/2018  . ARF (acute renal failure) ([place]) 02/19/2020  . Asthma   . Atherosclerosis of native arteries of the extremities with ulceration ([place]) 02/24/2018   Formatting of this note might be different from the original. Last Assessment & Plan:  His ABIs today are 1.07 on the right and 0.99 on the left with biphasic waveforms.  Although these pressures may be somewhat elevated from medial calcification, his flow does appear to be reasonably good.  His left ABI was 0.58 prior to intervention. At this point, we will stretch out his follow-up and see him b  . Benign hypertension with CKD (chronic kidney disease) stage IV ([place]) 02/24/2018   Last Assessment & Plan:  blood pressure control important in reducing the progression of atherosclerotic disease. On appropriate oral medications.  . Benign prostatic hyperplasia without lower urinary tract symptoms 01/14/2018  . Bilateral lower extremity edema 11/04/2018  . Bradycardia 03/25/2018  . Bruit of right carotid artery 07/26/2015  . CAD (coronary artery disease) 07/26/2015   Formatting of this note might be different from the original. [name]  Formatting of this note might be different from the original. [name]  . Cardiac murmur 07/26/2015  . CHF (congestive heart failure) ([place])   . Chronic combined systolic and diastolic CHF (congestive heart failure) ([place]) 07/26/2015   Formatting of this note might be different from the original. [place] EF 35%  .  Chronic idiopathic constipation 11/02/2018  . Chronic low back pain without sciatica 12/02/2018  . Chronic pain of right hip 03/31/2020  . Chronic systolic heart failure ([place]) 07/26/2015   Formatting of this note might be different from the original. [place] EF 35%  . CKD (chronic kidney disease) stage 4, GFR 15-29 ml/min ([place]) 10/07/2018  . Coronary artery disease   . Diabetes mellitus without complication ([place])   . Dysphagia 07/26/2015  . Erectile  dysfunction 07/14/2017  . Foot ulcer ([place])    left foot  . GERD (gastroesophageal reflux disease)   . Heart murmur   . Hematuria 07/27/2015  . High risk medication use 07/26/2015  . Hyperkalemia 05/29/2020  . Hyperlipidemia 07/26/2015  . Hyperlipidemia associated with type 2 diabetes mellitus ([place]) 02/18/2020  . Hypertension   . Hypertension associated with diabetes ([place])   . Hypoglycemia 05/07/2019  . Hypothyroidism 11/14/2015  . Hypothyroidism (acquired) 11/14/2015  . Insomnia 11/02/2018  . Kidney insufficiency   . Lumbar spondylosis 03/20/2020  . Malaise and fatigue 03/25/2018  . Mild aortic stenosis 05/12/2019  . Mitral regurgitation 05/12/2019  . Moderate aortic regurgitation 05/12/2019  . Non-compliance 05/05/2019  . Osseous and subluxation stenosis of intervertebral foramina of lumbar region 03/20/2020  . Peripheral vascular disease ([place])   . Polyneuropathy in diabetes ([place]) 05/07/2019  . Primary osteoarthritis involving multiple joints 10/06/2019  . Primary osteoarthritis of right hip 02/21/2020  . Puncture wound of right hip 04/02/2018  . PVD (peripheral vascular disease) with claudication ([place]) 07/26/2015   Angioplasty 02/2018  . Regional wall motion abnormality of heart 05/07/2019  . Right groin pain 02/18/2020  . Screening for colon cancer 02/18/2018  . Sleep apnea 07/26/2015  . Subacute osteomyelitis of left foot ([place]) 03/25/2018  . Type 2 diabetes mellitus with stage 3 chronic kidney disease, with long-term current use of insulin ([place]) 07/26/2015  . Type 2 diabetes mellitus with stage 4 chronic kidney disease, with long-term current use of insulin ([place]) 07/26/2015  . Vitamin B12 deficiency 06/16/2019  . Vitamin D deficiency 05/07/2018    Past Surgical History:  Procedure Laterality Date  . AMPUTATION TOE Left 03/13/2018   Procedure: AMPUTATION TOE-MPJ;  Surgeon: Samara Deist, DPM;  Location: [facility] ORS;  Service: Podiatry;  Laterality: Left;  . ANGIOPLASTY    . CARDIAC  CATHETERIZATION    . CARDIAC SURGERY     heart bypass (4)  . CORONARY ARTERY BYPASS GRAFT    . LAPAROSCOPIC APPENDECTOMY N/A 09/08/2018   Procedure: APPENDECTOMY LAPAROSCOPIC;  Surgeon: Jules Husbands, MD;  Location: [facility] ORS;  Service: General;  Laterality: N/A;  . LOWER EXTREMITY ANGIOGRAPHY Left 03/02/2018   Procedure: LOWER EXTREMITY ANGIOGRAPHY;  Surgeon: Algernon Huxley, MD;  Location: [facility];  Service: Cardiovascular;  Laterality: Left;  . ROTATOR CUFF REPAIR Left      reports that he quit smoking about 19 years ago. He has never used smokeless tobacco. He reports previous alcohol use. He reports previous drug use. Drug: Marijuana.  No Known Allergies  Family History  Problem Relation Age of Onset  . Hyperlipidemia Mother   . Hypertension Mother   . Diabetes Mother   . Heart attack Brother   . Heart disease Brother   . Lung disease Father     Prior to Admission medications   Medication Sig Start Date End Date Taking? Authorizing Provider  acetaminophen (TYLENOL) 500 MG tablet Take 1,000 mg by mouth every 6 (six) hours as needed for moderate pain or  headache.     [provider]  aspirin 81 MG tablet Take 81 mg by mouth daily.    [provider]  bumetanide (BUMEX) 1 MG tablet Take 1 mg by mouth daily. 03/24/20   [provider]  carvedilol (COREG) 6.25 MG tablet Take 2 tablets (12.5 mg total) by mouth 2 (two) times daily with a meal. 02/22/20 03/23/20  Shahmehdi, Valeria Batman, MD  cloNIDine (CATAPRES) 0.3 MG tablet Take 0.3 mg by mouth 2 (two) times daily.     [provider]  clopidogrel (PLAVIX) 75 MG tablet Take 1 tablet (75 mg total) by mouth daily. 03/02/18   Algernon Huxley, MD  gabapentin (NEURONTIN) 300 MG capsule Take 300 mg by mouth 2 (two) times daily.    [provider]  isosorbide mononitrate (IMDUR) 120 MG 24 hr tablet Take 120 mg by mouth daily.    [provider]  LEVEMIR FLEXTOUCH 100 UNIT/ML FlexPen Inject  20 Units into the skin at bedtime. 02/22/20   Shahmehdi, Valeria Batman, MD  levothyroxine (SYNTHROID) 25 MCG tablet Take 25 mcg by mouth daily.  10/09/18   [provider]  lovastatin (MEVACOR) 40 MG tablet Take 40 mg by mouth at bedtime.    [provider]  metolazone (ZAROXOLYN) 2.5 MG tablet Take 2.5 mg by mouth daily. 04/18/20   [provider]  nitroGLYCERIN (NITROSTAT) 0.4 MG SL tablet Place 0.4 mg under the tongue every 5 (five) minutes as needed for chest pain.     [provider]  tamsulosin (FLOMAX) 0.4 MG CAPS capsule Take 0.4 mg by mouth daily. 04/19/20   [provider]  traMADol (ULTRAM) 50 MG tablet Take 50 mg by mouth every 6 (six) hours as needed. 03/31/20   [provider]  vitamin B-12 (CYANOCOBALAMIN) 500 MCG tablet Take 500 mcg by mouth daily.     [provider]    Physical Exam: Vitals:   11/06/20 1530 11/06/20 1600 11/06/20 1630 11/06/20 1707  BP: 107/64 (!) 100/59 (!) 112/52 (!) 100/55  Pulse: 79 77 74 73  Resp: 16 (!) '23 19 16  '$ Temp:      TempSrc:      SpO2: 98% 95% 95% 98%  Weight:      Height:          Constitutional: NAD, calm, comfortable ENMT:dry mucous membranes Neck: normal, supple, no masses, no thyromegaly Respiratory: clear to auscultation bilaterally, no wheezing, no crackles. Normal respiratory effort. No accessory muscle use.  Cardiovascular: Regular rate and rhythm, no murmurs / rubs / gallops. No extremity edema. 2+ pedal pulses.  Abdomen: no tenderness, no masses palpated. Bowel sounds positive.  Musculoskeletal: no clubbing / cyanosis. Normal muscle tone.  Skin: dry especially on bilateral Psychiatric:  Alert and oriented x 3. Normal mood.   Labs on Admission: I have personally reviewed following labs and imaging studies  CBC: Recent Labs  Lab 11/06/20 1438  WBC 12.9*  HGB 12.8*  HCT 39.7  MCV 88.6  PLT 0000000   Basic Metabolic Panel: Recent Labs  Lab 11/06/20 1438  NA 135   K 5.1  CL 98  CO2 24  GLUCOSE 193*  BUN 73*  CREATININE 3.54*  CALCIUM 9.0   GFR: Estimated Creatinine Clearance: 22.8 mL/min (A) (by C-G formula based on SCr of 3.54 mg/dL (H)). Liver Function Tests: Recent Labs  Lab 11/06/20 1438  AST 24  ALT 14  ALKPHOS 70  BILITOT 1.3*  PROT 7.8  ALBUMIN 3.5  No results for input(s): LIPASE, AMYLASE in the last 168 hours. No results for input(s): AMMONIA in the last 168 hours. Coagulation Profile: No results for input(s): INR, PROTIME in the last 168 hours. Cardiac Enzymes: No results for input(s): CKTOTAL, CKMB, CKMBINDEX, TROPONINI in the last 168 hours. BNP (last 3 results) No results for input(s): PROBNP in the last 8760 hours. HbA1C: No results for input(s): HGBA1C in the last 72 hours. CBG: No results for input(s): GLUCAP in the last 168 hours. Lipid Profile: No results for input(s): CHOL, HDL, LDLCALC, TRIG, CHOLHDL, LDLDIRECT in the last 72 hours. Thyroid Function Tests: No results for input(s): TSH, T4TOTAL, FREET4, T3FREE, THYROIDAB in the last 72 hours. Anemia Panel: No results for input(s): VITAMINB12, FOLATE, FERRITIN, TIBC, IRON, RETICCTPCT in the last 72 hours. Urine analysis:    Component Value Date/Time   COLORURINE YELLOW (A) 02/19/2020 0130   APPEARANCEUR CLEAR (A) 02/19/2020 0130   LABSPEC 1.012 02/19/2020 0130   PHURINE 5.0 02/19/2020 0130   GLUCOSEU 50 (A) 02/19/2020 0130   HGBUR SMALL (A) 02/19/2020 0130   BILIRUBINUR NEGATIVE 02/19/2020 0130   KETONESUR NEGATIVE 02/19/2020 0130   PROTEINUR 100 (A) 02/19/2020 0130   NITRITE NEGATIVE 02/19/2020 0130   LEUKOCYTESUR NEGATIVE 02/19/2020 0130     Radiological Exams on Admission: CT Abdomen Pelvis Wo Contrast  Result Date: 11/06/2020 CLINICAL DATA:  Generalized abdominal pain EXAM: CT ABDOMEN AND PELVIS WITHOUT CONTRAST TECHNIQUE: Multidetector CT imaging of the abdomen and pelvis was performed following the standard protocol without IV contrast.  COMPARISON:  10/05/2018 FINDINGS: Lower chest: 5 mm nodule within the periphery of the left lower lobe is stable from prior. Included lung bases are otherwise clear. Heart size is mildly enlarged. Hepatobiliary: Layering stones and sludge within the gallbladder lumen. No pericholecystic inflammatory changes by CT. Unremarkable unenhanced appearance of the liver. No biliary dilatation. Pancreas: Unremarkable. No pancreatic ductal dilatation or surrounding inflammatory changes. Spleen: Normal in size without focal abnormality. Adrenals/Urinary Tract: Adrenal glands are unremarkable. Kidneys are normal, without renal calculi, focal lesion, or hydronephrosis. Bladder is unremarkable. Stomach/Bowel: Stomach is within normal limits. Appendix is surgically absent. No evidence of bowel wall thickening, distention, or inflammatory changes. Vascular/Lymphatic: Atherosclerosis throughout the aortoiliac axis. No aneurysm. No abdominopelvic lymphadenopathy. Reproductive: Prostate is unremarkable. Other: No free fluid. No abdominopelvic fluid collection. No pneumoperitoneum. No abdominal wall hernia. Musculoskeletal: Chronic bilateral L5 pars interarticularis defects with grade 1 anterolisthesis of L5 on S1. There is degenerative disc disease at the L5-S1 level. Bilateral hip arthropathy, right worse than left. IMPRESSION: 1. No acute abdominopelvic findings. 2. Cholelithiasis without evidence of acute cholecystitis. 3. Chronic bilateral L5 pars interarticularis defects with grade 1 anterolisthesis of L5 on S1. Aortic Atherosclerosis (ICD10-I70.0). Electronically Signed   By: Davina Poke D.O.   On: 11/06/2020 16:51   DG Chest Port 1 View  Result Date: 11/06/2020 CLINICAL DATA:  Weakness, fever EXAM: PORTABLE CHEST 1 VIEW COMPARISON:  Portable exam 1552 hours compared to 09/26/2020 FINDINGS: Borderline enlargement of cardiac silhouette post CABG. Mediastinal contours and pulmonary vascularity normal. Minimal bibasilar  atelectasis. Lungs otherwise clear. No acute infiltrate, pleural effusion, or pneumothorax IMPRESSION: Post CABG. Minimal bibasilar atelectasis. Electronically Signed   By: Lavonia Dana M.D.   On: 11/06/2020 16:04      Assessment/Plan Active Problems:   Type 2 diabetes mellitus with stage 3 chronic kidney disease, with long-term current use of insulin (HCC)   Bilateral lower extremity edema   Chronic combined systolic and diastolic CHF (  congestive heart failure) ([place])   Acute kidney injury superimposed on CKD ([place])   ?unresponsive episode (patient states he was in his car for 3 hours) -doubt seizures but will DC tramadol due to lowering seizure threshold -suspect related to hypotension and dehydration -Suspect fever is related to being in a hot car for extended period of time  AKI on CKD stage IV  -Suspect prerenal as I suspect patient has been taking odd amounts of home diuretics. - We will hold home Bumex, ARB, and metolazone - IV fluids with BMP recheck in the a.m. - UA - Strict I's and O's and daily weights  Mild leukocytosis -hold abx -? Reactive -await U/A -Suggest fever could be from the hot nature outpatient scar and 3 hours spent in said car  Chronic combined systolic and diastolic CHF: EF 99991111, G3 DD by TTE 05/12/2019.  Appears volume depleted on admission as above. -Holding home Bumex and lisinopril -Resume carvedilol and Imdur when blood pressure improved -Continue gentle IV fluid hydration overnight -Strict I/O's and daily weights  Reported bilateral lower extremity edema - Currently no edema - Will get patient compression stockings  Diabetes type 2 Hold Levemir 10 units - Sliding scale insulin Carb mod diet   DVT prophylaxis: heparin Code Status: full    Admission status: obs   At the point of initial evaluation, it is my clinical opinion that admission for OBSERVATION is reasonable and necessary because the patient's presenting complaints in the  context of their chronic conditions represent sufficient risk of deterioration or significant morbidity to constitute reasonable grounds for close observation in the hospital setting, but that the patient may be medically stable for discharge from the hospital within 24 to 48 hours.   Geradine Girt [facility]   How to contact the [facility] Attending or Consulting provider [place] or covering provider during after hours [place], for this patient?  1. Check the care team in [facility] and look for a) attending/consulting TR[facility] provider listed and b) the [facility] team listed 2. Log into [email or web address] and use [place]'s universal password to access. If you do not have the password, please contact the hospital operator. 3. Locate the [facility] provider you are looking for under [facility] and page to a number that you can be directly reached. 4. If you still have difficulty reaching the provider, please page the [facility] (Director on Call) for the Hospitalists listed on amion for assistance.  11/06/2020, 5:43 PM

## 2020-11-06 NOTE — ED Triage Notes (Signed)
Patient brought in by [facility] for weakness and fever.  EMS states the patient was on the side of the road in his truck for several hours.   CBG: 268 Temp: 103.1 '600mg'$  tylenol @ 1330 BP: 140/86

## 2020-11-06 NOTE — ED Provider Notes (Signed)
Patient seen after prior ED provider.    Patient's work-up demonstrates AKI with CKD.  White count is mildly elevated at 12.9.  Flu and COVID swabs were negative.  Chest x-ray is without acute process.  UA is pending at time of admission.  Hospitalist service is aware of case and will evaluate for admission.    Valarie Merino, MD 11/06/20 [phone]

## 2020-11-06 NOTE — ED Notes (Signed)
attempted report X 1 

## 2020-11-07 ENCOUNTER — Encounter (HOSPITAL_COMMUNITY): Payer: Self-pay | Admitting: Internal Medicine

## 2020-11-07 DIAGNOSIS — D631 Anemia in chronic kidney disease: Secondary | ICD-10-CM | POA: Diagnosis present

## 2020-11-07 DIAGNOSIS — N189 Chronic kidney disease, unspecified: Secondary | ICD-10-CM | POA: Diagnosis not present

## 2020-11-07 DIAGNOSIS — I959 Hypotension, unspecified: Secondary | ICD-10-CM | POA: Diagnosis present

## 2020-11-07 DIAGNOSIS — E1151 Type 2 diabetes mellitus with diabetic peripheral angiopathy without gangrene: Secondary | ICD-10-CM | POA: Diagnosis present

## 2020-11-07 DIAGNOSIS — R600 Localized edema: Secondary | ICD-10-CM | POA: Diagnosis not present

## 2020-11-07 DIAGNOSIS — Y92239 Unspecified place in hospital as the place of occurrence of the external cause: Secondary | ICD-10-CM | POA: Diagnosis present

## 2020-11-07 DIAGNOSIS — D72829 Elevated white blood cell count, unspecified: Secondary | ICD-10-CM | POA: Diagnosis present

## 2020-11-07 DIAGNOSIS — Z7902 Long term (current) use of antithrombotics/antiplatelets: Secondary | ICD-10-CM | POA: Diagnosis not present

## 2020-11-07 DIAGNOSIS — E1122 Type 2 diabetes mellitus with diabetic chronic kidney disease: Secondary | ICD-10-CM | POA: Diagnosis present

## 2020-11-07 DIAGNOSIS — E860 Dehydration: Secondary | ICD-10-CM | POA: Diagnosis present

## 2020-11-07 DIAGNOSIS — Z20822 Contact with and (suspected) exposure to covid-19: Secondary | ICD-10-CM | POA: Diagnosis present

## 2020-11-07 DIAGNOSIS — R509 Fever, unspecified: Secondary | ICD-10-CM | POA: Diagnosis present

## 2020-11-07 DIAGNOSIS — Z951 Presence of aortocoronary bypass graft: Secondary | ICD-10-CM | POA: Diagnosis not present

## 2020-11-07 DIAGNOSIS — I2510 Atherosclerotic heart disease of native coronary artery without angina pectoris: Secondary | ICD-10-CM | POA: Diagnosis present

## 2020-11-07 DIAGNOSIS — I5042 Chronic combined systolic (congestive) and diastolic (congestive) heart failure: Secondary | ICD-10-CM | POA: Diagnosis present

## 2020-11-07 DIAGNOSIS — N184 Chronic kidney disease, stage 4 (severe): Secondary | ICD-10-CM | POA: Diagnosis present

## 2020-11-07 DIAGNOSIS — T502X5A Adverse effect of carbonic-anhydrase inhibitors, benzothiadiazides and other diuretics, initial encounter: Secondary | ICD-10-CM | POA: Diagnosis present

## 2020-11-07 DIAGNOSIS — Z89412 Acquired absence of left great toe: Secondary | ICD-10-CM | POA: Diagnosis not present

## 2020-11-07 DIAGNOSIS — Z794 Long term (current) use of insulin: Secondary | ICD-10-CM | POA: Diagnosis not present

## 2020-11-07 DIAGNOSIS — I130 Hypertensive heart and chronic kidney disease with heart failure and stage 1 through stage 4 chronic kidney disease, or unspecified chronic kidney disease: Secondary | ICD-10-CM | POA: Diagnosis present

## 2020-11-07 DIAGNOSIS — N179 Acute kidney failure, unspecified: Secondary | ICD-10-CM | POA: Diagnosis present

## 2020-11-07 DIAGNOSIS — E785 Hyperlipidemia, unspecified: Secondary | ICD-10-CM | POA: Diagnosis present

## 2020-11-07 DIAGNOSIS — E1142 Type 2 diabetes mellitus with diabetic polyneuropathy: Secondary | ICD-10-CM | POA: Diagnosis present

## 2020-11-07 DIAGNOSIS — Z7982 Long term (current) use of aspirin: Secondary | ICD-10-CM | POA: Diagnosis not present

## 2020-11-07 DIAGNOSIS — R531 Weakness: Secondary | ICD-10-CM | POA: Diagnosis present

## 2020-11-07 DIAGNOSIS — Z79899 Other long term (current) drug therapy: Secondary | ICD-10-CM | POA: Diagnosis not present

## 2020-11-07 DIAGNOSIS — Z79890 Hormone replacement therapy (postmenopausal): Secondary | ICD-10-CM | POA: Diagnosis not present

## 2020-11-07 DIAGNOSIS — E039 Hypothyroidism, unspecified: Secondary | ICD-10-CM | POA: Diagnosis present

## 2020-11-07 HISTORY — DX: Fever, unspecified: R50.9

## 2020-11-07 LAB — COMPREHENSIVE METABOLIC PANEL
ALT: 11 U/L (ref 0–44)
AST: 15 U/L (ref 15–41)
Albumin: 3.2 g/dL — ABNORMAL LOW (ref 3.5–5.0)
Alkaline Phosphatase: 61 U/L (ref 38–126)
Anion gap: 13 (ref 5–15)
BUN: 79 mg/dL — ABNORMAL HIGH (ref 8–23)
CO2: 24 mmol/L (ref 22–32)
Calcium: 8.7 mg/dL — ABNORMAL LOW (ref 8.9–10.3)
Chloride: 99 mmol/L (ref 98–111)
Creatinine, Ser: 3.57 mg/dL — ABNORMAL HIGH (ref 0.61–1.24)
GFR, Estimated: 18 mL/min — ABNORMAL LOW (ref >60)
Glucose, Bld: 148 mg/dL — ABNORMAL HIGH (ref 70–99)
Potassium: 3.9 mmol/L (ref 3.5–5.1)
Sodium: 136 mmol/L (ref 135–145)
Total Bilirubin: 1.4 mg/dL — ABNORMAL HIGH (ref 0.3–1.2)
Total Protein: 7.2 g/dL (ref 6.5–8.1)

## 2020-11-07 LAB — URINALYSIS, ROUTINE W REFLEX MICROSCOPIC
Bilirubin Urine: NEGATIVE
Glucose, UA: 50 mg/dL — AB
Ketones, ur: NEGATIVE mg/dL
Leukocytes,Ua: NEGATIVE
Nitrite: NEGATIVE
Protein, ur: 100 mg/dL — AB
Specific Gravity, Urine: 1.011 (ref 1.005–1.030)
pH: 5 (ref 5.0–8.0)

## 2020-11-07 LAB — CBC
HCT: 36.3 % — ABNORMAL LOW (ref 39.0–52.0)
Hemoglobin: 11.9 g/dL — ABNORMAL LOW (ref 13.0–17.0)
MCH: 28.8 pg (ref 26.0–34.0)
MCHC: 32.8 g/dL (ref 30.0–36.0)
MCV: 87.9 fL (ref 80.0–100.0)
Platelets: 129 10*3/uL — ABNORMAL LOW (ref 150–400)
RBC: 4.13 MIL/uL — ABNORMAL LOW (ref 4.22–5.81)
RDW: 13.3 % (ref 11.5–15.5)
WBC: 14.8 10*3/uL — ABNORMAL HIGH (ref 4.0–10.5)
nRBC: 0 % (ref 0.0–0.2)

## 2020-11-07 LAB — GLUCOSE, CAPILLARY
Glucose-Capillary: 153 mg/dL — ABNORMAL HIGH (ref 70–99)
Glucose-Capillary: 214 mg/dL — ABNORMAL HIGH (ref 70–99)
Glucose-Capillary: 172 mg/dL — ABNORMAL HIGH (ref 70–99)
Glucose-Capillary: 152 mg/dL — ABNORMAL HIGH (ref 70–99)

## 2020-11-07 LAB — MAGNESIUM: Magnesium: 1.6 mg/dL — ABNORMAL LOW (ref 1.7–2.4)

## 2020-11-07 MED ORDER — VANCOMYCIN HCL 1750 MG/350ML IV SOLN
1750.0000 mg | Freq: Once | INTRAVENOUS | Status: AC
  Administered 2020-11-07: 1750 mg via INTRAVENOUS
  Filled 2020-11-07: qty 350

## 2020-11-07 MED ORDER — SODIUM CHLORIDE 0.9 % IV SOLN
2.0000 g | Freq: Once | INTRAVENOUS | Status: AC
  Administered 2020-11-07: 2 g via INTRAVENOUS
  Filled 2020-11-07: qty 2

## 2020-11-07 MED ORDER — CARVEDILOL 6.25 MG PO TABS
6.2500 mg | ORAL_TABLET | Freq: Two times a day (BID) | ORAL | Status: DC
  Administered 2020-11-07 – 2020-11-10 (×6): 6.25 mg via ORAL
  Filled 2020-11-07 (×6): qty 1

## 2020-11-07 MED ORDER — SODIUM CHLORIDE 0.9 % IV SOLN
2.0000 g | INTRAVENOUS | Status: DC
  Administered 2020-11-08 – 2020-11-09 (×2): 2 g via INTRAVENOUS
  Filled 2020-11-07 (×3): qty 2

## 2020-11-07 MED ORDER — SODIUM CHLORIDE 0.9 % IV SOLN
2.0000 g | INTRAVENOUS | Status: DC
  Filled 2020-11-07: qty 2

## 2020-11-07 MED ORDER — VANCOMYCIN VARIABLE DOSE PER UNSTABLE RENAL FUNCTION (PHARMACIST DOSING): Status: DC

## 2020-11-07 NOTE — Progress Notes (Signed)
Pharmacy Antibiotic Note  Phillip [name] is a 65 y.o. male admitted on 11/06/2020 with concern for sepsis.  Pharmacy has been consulted for Vancomycin + Cefepime dosing.  Plan: - Vancomycin 1750 mg IV x 1 - No standing Vanc for now - will consider a random level on 6/9 AM - Cefepime 2g IV every 24 hours - Will continue to follow renal function, culture results, LOT, and antibiotic de-escalation plans   Height: 6' (182.9 cm) Weight: 91.1 kg (200 lb 12.8 oz) IBW/kg (Calculated) : 77.6  Temp (24hrs), Avg:100.4 F (38 C), Min:97.9 F (36.6 C), Max:103.5 F (39.7 C)  Recent Labs  Lab 11/06/20 1438 11/07/20 0653  WBC 12.9* 14.8*  CREATININE 3.54* 3.57*  LATICACIDVEN 1.7  --     Estimated Creatinine Clearance: 22.6 mL/min (A) (by C-G formula based on SCr of 3.57 mg/dL (H)).    No Known Allergies  Antimicrobials this admission: Vanc 6/7 >> Cefepime 6/7 >>  Dose adjustments this admission: n/a  Microbiology results: 6/6 Flu/COVID >> neg 6/6 BCx >> 6/6 UCx >>  Thank you for allowing pharmacy to be a part of this patient's care.  Alycia Rossetti, PharmD, BCPS Clinical Pharmacist 11/07/2020 2:30 PM   **Pharmacist phone directory can now be found on [email or web address] ([email or web address]).  Listed under [place].

## 2020-11-07 NOTE — Progress Notes (Signed)
PT Cancellation Note  Patient Details Name: Phillip [name] MRN: [id number] DOB: 02/20/1956   Cancelled Treatment:    Reason Eval/Treat Not Completed: Medical issues which prohibited therapy Pt febrile with cooling blanket; not appropriate per RN.   Wyona Almas, PT, DPT Acute Rehabilitation Services Pager [phone] Office [phone]    Deno Etienne 11/07/2020, 2:51 PM

## 2020-11-07 NOTE — Progress Notes (Signed)
Patient's vital signs turned to yellow for temp 103.2. Discussed patient's condition with charge nurse and MD Eulogio Bear. Started medicine as per ordered. Will continue monitor.

## 2020-11-07 NOTE — Progress Notes (Signed)
Progress Note    Phillip [name]  [id number] DOB: 1956/03/07  DOA: 11/06/2020 PCP: Raina Mina., MD    Brief Narrative:     Medical records reviewed and are as summarized below:  Phillip [name] is an 65 y.o. male with medical history significant of chronic benign systolic and diastolic CHF (EF 99991111, G3 DD by TTE 05/12/2019), CKD stage IV, CAD s/p CABG, PVD, IDT2DM, HTN, HLD, anemia of chronic disease, and hypothyroidism who presents to the ED for unclear reasons but found to have fever and worsening kidney failure.   Assessment/Plan:   Active Problems:   Type 2 diabetes mellitus with stage 3 chronic kidney disease, with long-term current use of insulin (HCC)   Bilateral lower extremity edema   Chronic combined systolic and diastolic CHF (congestive heart failure) (HCC)   Acute kidney injury superimposed on CKD ([place])    ?unresponsive episode (patient states he was in his car for 3 hours)/fever -doubt seizures but will DC tramadol due to lowering seizure threshold -suspect related to hypotension and dehydration -no nucal rigidity -U/A clear -CT abd/pelvis unremarkable -xray negative -blood cultures pending -will start broad spectrum abx while work up in process  AKI on CKD stage IV  -Suspect prerenal as I suspect patient has been taking odd/differing amounts of home diuretics. - We will hold home Bumex, ARB, and metolazone - IV fluids with BMP recheck in the a.m. - Strict I's and O's and daily weights  Chronic combined systolic and diastolic CHF: EF 99991111, G3 DD by TTE 05/12/2019. Appears volume depleted on admission as above. -Holding home Bumex and lisinopril -Resume carvedilol and Imdur when blood pressure improved -Continue gentle IV fluid hydration overnight -Strict I/O's and daily weights  Reported bilateral lower extremity edema - Currently no edema - Will get patient compression stockings  Diabetes type 2 Hold Levemir 10 units - Sliding  scale insulin Carb mod diet Body mass index is 27.23 kg/m.   Family Communication/Anticipated D/C date and plan/Code Status   DVT prophylaxis: Lovenox ordered. Disposition Plan: Status is: Observation  The patient will require care spanning > 2 midnights and should be moved to inpatient because: Inpatient level of care appropriate due to severity of illness  Dispo: The patient is from: Home              Anticipated d/c is to: Home              Patient currently is not medically stable to d/c.   Difficult to place patient No         Medical Consultants:    None.     Subjective:   Has occasional lower abdominal pain  Objective:    Vitals:   11/07/20 0619 11/07/20 1009 11/07/20 1120 11/07/20 1136  BP: 135/73 (!) 154/94  (!) 151/56  Pulse: 90 96  (!) 103  Resp: '18 19  20  '$ Temp: 99.3 F (37.4 C) 99.6 F (37.6 C) (!) 103 F (39.4 C) (!) 103.2 F (39.6 C)  TempSrc: Oral Oral Oral Oral  SpO2: 98% 98%  96%  Weight: 91.1 kg     Height:        Intake/Output Summary (Last 24 hours) at 11/07/2020 1322 Last data filed at 11/07/2020 0006 Gross per 24 hour  Intake 480 ml  Output 300 ml  Net 180 ml   Filed Weights   11/06/20 1409 11/07/20 0619  Weight: 91.2 kg 91.1 kg    Exam:  General:  Appearance:     Overweight male in no acute distress     Lungs:     respirations unlabored  Heart:    Tachycardic. Normal rhythm.    MS:   Left 1st toe amputated.   Neurologic:   Awake, alert, pleasant and cooperative    Data Reviewed:   I have personally reviewed following labs and imaging studies:  Labs: Labs show the following:   Basic Metabolic Panel: Recent Labs  Lab 11/06/20 1438 11/07/20 0653  NA 135 136  K 5.1 3.9  CL 98 99  CO2 24 24  GLUCOSE 193* 148*  BUN 73* 79*  CREATININE 3.54* 3.57*  CALCIUM 9.0 8.7*   GFR Estimated Creatinine Clearance: 22.6 mL/min (A) (by C-G formula based on SCr of 3.57 mg/dL (H)). Liver Function Tests: Recent Labs   Lab 11/06/20 1438 11/07/20 0653  AST 24 15  ALT 14 11  ALKPHOS 70 61  BILITOT 1.3* 1.4*  PROT 7.8 7.2  ALBUMIN 3.5 3.2*   No results for input(s): LIPASE, AMYLASE in the last 168 hours. No results for input(s): AMMONIA in the last 168 hours. Coagulation profile No results for input(s): INR, PROTIME in the last 168 hours.  CBC: Recent Labs  Lab 11/06/20 1438 11/07/20 0653  WBC 12.9* 14.8*  HGB 12.8* 11.9*  HCT 39.7 36.3*  MCV 88.6 87.9  PLT 178 129*   Cardiac Enzymes: No results for input(s): CKTOTAL, CKMB, CKMBINDEX, TROPONINI in the last 168 hours. BNP (last 3 results) No results for input(s): PROBNP in the last 8760 hours. CBG: Recent Labs  Lab 11/06/20 2059 11/07/20 0629 11/07/20 1119  GLUCAP 217* 153* 214*   D-Dimer: No results for input(s): DDIMER in the last 72 hours. Hgb A1c: No results for input(s): HGBA1C in the last 72 hours. Lipid Profile: No results for input(s): CHOL, HDL, LDLCALC, TRIG, CHOLHDL, LDLDIRECT in the last 72 hours. Thyroid function studies: No results for input(s): TSH, T4TOTAL, T3FREE, THYROIDAB in the last 72 hours.  Invalid input(s): FREET3 Anemia work up: No results for input(s): VITAMINB12, FOLATE, FERRITIN, TIBC, IRON, RETICCTPCT in the last 72 hours. Sepsis Labs: Recent Labs  Lab 11/06/20 1438 11/07/20 0653  WBC 12.9* 14.8*  LATICACIDVEN 1.7  --     Microbiology Recent Results (from the past 240 hour(s))  Resp Panel by RT-PCR (Flu A&B, Covid) Nasopharyngeal Swab     Status: None   Collection Time: 11/06/20  2:30 PM   Specimen: Nasopharyngeal Swab; Nasopharyngeal(NP) swabs in vial transport medium  Result Value Ref Range Status   SARS Coronavirus 2 by RT PCR NEGATIVE NEGATIVE Final    Comment: (NOTE) SARS-CoV-2 target nucleic acids are NOT DETECTED.  The SARS-CoV-2 RNA is generally detectable in upper respiratory specimens during the acute phase of infection. The lowest concentration of SARS-CoV-2 viral copies  this assay can detect is 138 copies/mL. A negative result does not preclude SARS-Cov-2 infection and should not be used as the sole basis for treatment or other patient management decisions. A negative result may occur with  improper specimen collection/handling, submission of specimen other than nasopharyngeal swab, presence of viral mutation(s) within the areas targeted by this assay, and inadequate number of viral copies(<138 copies/mL). A negative result must be combined with clinical observations, patient history, and epidemiological information. The expected result is Negative.  Fact Sheet for Patients:  EntrepreneurPulse.com.au  Fact Sheet for Healthcare Providers:  IncredibleEmployment.be  This test is no t yet approved or cleared by the [place] FDA  and  has been authorized for detection and/or diagnosis of SARS-CoV-2 by FDA under an Emergency Use Authorization (EUA). This EUA will remain  in effect (meaning this test can be used) for the duration of the COVID-19 declaration under Section 564(b)(1) of the Act, 21 U.S.C.section 360bbb-3(b)(1), unless the authorization is terminated  or revoked sooner.       Influenza A by PCR NEGATIVE NEGATIVE Final   Influenza B by PCR NEGATIVE NEGATIVE Final    Comment: (NOTE) The Xpert Xpress SARS-CoV-2/FLU/RSV plus assay is intended as an aid in the diagnosis of influenza from Nasopharyngeal swab specimens and should not be used as a sole basis for treatment. Nasal washings and aspirates are unacceptable for Xpert Xpress SARS-CoV-2/FLU/RSV testing.  Fact Sheet for Patients: EntrepreneurPulse.com.au  Fact Sheet for Healthcare Providers: IncredibleEmployment.be  This test is not yet approved or cleared by the [place] FDA and has been authorized for detection and/or diagnosis of SARS-CoV-2 by FDA under an Emergency Use Authorization (EUA). This EUA  will remain in effect (meaning this test can be used) for the duration of the COVID-19 declaration under Section 564(b)(1) of the Act, 21 U.S.C. section 360bbb-3(b)(1), unless the authorization is terminated or revoked.  Performed at [facility], [place] [address] [place], [place] [address]     Procedures and diagnostic studies:  CT Abdomen Pelvis Wo Contrast  Result Date: 11/06/2020 CLINICAL DATA:  Generalized abdominal pain EXAM: CT ABDOMEN AND PELVIS WITHOUT CONTRAST TECHNIQUE: Multidetector CT imaging of the abdomen and pelvis was performed following the standard protocol without IV contrast. COMPARISON:  10/05/2018 FINDINGS: Lower chest: 5 mm nodule within the periphery of the left lower lobe is stable from prior. Included lung bases are otherwise clear. Heart size is mildly enlarged. Hepatobiliary: Layering stones and sludge within the gallbladder lumen. No pericholecystic inflammatory changes by CT. Unremarkable unenhanced appearance of the liver. No biliary dilatation. Pancreas: Unremarkable. No pancreatic ductal dilatation or surrounding inflammatory changes. Spleen: Normal in size without focal abnormality. Adrenals/Urinary Tract: Adrenal glands are unremarkable. Kidneys are normal, without renal calculi, focal lesion, or hydronephrosis. Bladder is unremarkable. Stomach/Bowel: Stomach is within normal limits. Appendix is surgically absent. No evidence of bowel wall thickening, distention, or inflammatory changes. Vascular/Lymphatic: Atherosclerosis throughout the aortoiliac axis. No aneurysm. No abdominopelvic lymphadenopathy. Reproductive: Prostate is unremarkable. Other: No free fluid. No abdominopelvic fluid collection. No pneumoperitoneum. No abdominal wall hernia. Musculoskeletal: Chronic bilateral L5 pars interarticularis defects with grade 1 anterolisthesis of L5 on S1. There is degenerative disc disease at the L5-S1 level. Bilateral hip arthropathy, right worse than left.  IMPRESSION: 1. No acute abdominopelvic findings. 2. Cholelithiasis without evidence of acute cholecystitis. 3. Chronic bilateral L5 pars interarticularis defects with grade 1 anterolisthesis of L5 on S1. Aortic Atherosclerosis (ICD10-I70.0). Electronically Signed   By: Davina Poke D.O.   On: 11/06/2020 16:51   DG Chest Port 1 View  Result Date: 11/06/2020 CLINICAL DATA:  Weakness, fever EXAM: PORTABLE CHEST 1 VIEW COMPARISON:  Portable exam 1552 hours compared to 09/26/2020 FINDINGS: Borderline enlargement of cardiac silhouette post CABG. Mediastinal contours and pulmonary vascularity normal. Minimal bibasilar atelectasis. Lungs otherwise clear. No acute infiltrate, pleural effusion, or pneumothorax IMPRESSION: Post CABG. Minimal bibasilar atelectasis. Electronically Signed   By: Lavonia Dana M.D.   On: 11/06/2020 16:04    Medications:   . aspirin EC  81 mg Oral Daily  . clopidogrel  75 mg Oral Daily  . gabapentin  300 mg Oral BID  . heparin  5,000 Units Subcutaneous Q8H  . insulin aspart  0-5 Units Subcutaneous QHS  . insulin aspart  0-9 Units Subcutaneous TID WC  . levothyroxine  25 mcg Oral Q0600  . tamsulosin  0.4 mg Oral Daily  . vitamin B-12  500 mcg Oral Daily   Continuous Infusions: . sodium chloride 75 mL/hr at 11/07/20 0920  . ceFEPime (MAXIPIME) IV    . vancomycin 1,750 mg (11/07/20 1240)     LOS: 0 days   Geradine Girt  [facility]   How to contact the [facility] Attending or Consulting provider [place] or covering provider during after hours [place], for this patient?  1. Check the care team in [facility] and look for a) attending/consulting [facility] provider listed and b) the [facility] team listed 2. Log into [email or web address] and use [place]'s universal password to access. If you do not have the password, please contact the hospital operator. 3. Locate the [facility] provider you are looking for under [facility] and page to a number that you can be directly reached. 4. If you  still have difficulty reaching the provider, please page the [facility] (Director on Call) for the Hospitalists listed on amion for assistance.  11/07/2020, 1:22 PM

## 2020-11-08 DIAGNOSIS — I5042 Chronic combined systolic (congestive) and diastolic (congestive) heart failure: Secondary | ICD-10-CM

## 2020-11-08 DIAGNOSIS — R600 Localized edema: Secondary | ICD-10-CM

## 2020-11-08 LAB — CBC
HCT: 38.8 % — ABNORMAL LOW (ref 39.0–52.0)
Hemoglobin: 12.6 g/dL — ABNORMAL LOW (ref 13.0–17.0)
MCH: 28.9 pg (ref 26.0–34.0)
MCHC: 32.5 g/dL (ref 30.0–36.0)
MCV: 89 fL (ref 80.0–100.0)
Platelets: 122 10*3/uL — ABNORMAL LOW (ref 150–400)
RBC: 4.36 MIL/uL (ref 4.22–5.81)
RDW: 13.6 % (ref 11.5–15.5)
WBC: 18.3 10*3/uL — ABNORMAL HIGH (ref 4.0–10.5)
nRBC: 0 % (ref 0.0–0.2)

## 2020-11-08 LAB — BASIC METABOLIC PANEL
Anion gap: 15 (ref 5–15)
BUN: 72 mg/dL — ABNORMAL HIGH (ref 8–23)
CO2: 20 mmol/L — ABNORMAL LOW (ref 22–32)
Calcium: 8.6 mg/dL — ABNORMAL LOW (ref 8.9–10.3)
Chloride: 103 mmol/L (ref 98–111)
Creatinine, Ser: 3.51 mg/dL — ABNORMAL HIGH (ref 0.61–1.24)
GFR, Estimated: 19 mL/min — ABNORMAL LOW (ref >60)
Glucose, Bld: 145 mg/dL — ABNORMAL HIGH (ref 70–99)
Potassium: 4 mmol/L (ref 3.5–5.1)
Sodium: 138 mmol/L (ref 135–145)

## 2020-11-08 LAB — CREATININE, URINE, RANDOM: Creatinine, Urine: 88.19 mg/dL

## 2020-11-08 LAB — GLUCOSE, CAPILLARY
Glucose-Capillary: 150 mg/dL — ABNORMAL HIGH (ref 70–99)
Glucose-Capillary: 131 mg/dL — ABNORMAL HIGH (ref 70–99)
Glucose-Capillary: 140 mg/dL — ABNORMAL HIGH (ref 70–99)
Glucose-Capillary: 159 mg/dL — ABNORMAL HIGH (ref 70–99)

## 2020-11-08 LAB — URINE CULTURE

## 2020-11-08 LAB — SODIUM, URINE, RANDOM: Sodium, Ur: 40 mmol/L

## 2020-11-08 LAB — MAGNESIUM: Magnesium: 1.7 mg/dL (ref 1.7–2.4)

## 2020-11-08 MED ORDER — CLONIDINE HCL 0.2 MG PO TABS
0.3000 mg | ORAL_TABLET | Freq: Every day | ORAL | Status: DC
  Administered 2020-11-08 – 2020-11-09 (×2): 0.3 mg via ORAL
  Filled 2020-11-08 (×2): qty 1

## 2020-11-08 MED ORDER — MAGNESIUM SULFATE 2 GM/50ML IV SOLN
2.0000 g | Freq: Once | INTRAVENOUS | Status: AC
  Administered 2020-11-08: 2 g via INTRAVENOUS
  Filled 2020-11-08: qty 50

## 2020-11-08 MED ORDER — ISOSORBIDE MONONITRATE ER 60 MG PO TB24
60.0000 mg | ORAL_TABLET | Freq: Every day | ORAL | Status: DC
  Administered 2020-11-08 – 2020-11-10 (×3): 60 mg via ORAL
  Filled 2020-11-08 (×3): qty 1

## 2020-11-08 NOTE — Evaluation (Signed)
Physical Therapy Evaluation Patient Details Name: Phillip [name] MRN: [id number] DOB: [birth year] Today's Date: 11/08/2020   History of Present Illness  Phillip [name] is a 65 y.o. male who was brought in for weakness and fever; presented to the ED for unclear reasons per patient-he does state that he went 3 hours on the side of the road this morning but not sure how he got there; with medical history significant of chronic benign systolic and diastolic CHF (EF 99991111, G3 DD by TTE 05/12/2019), CKD stage IV, CAD s/p CABG, PVD, IDT2DM, HTN, HLD, anemia of chronic disease, and hypothyroidism who presents to the ED for unclear reasons per patient-he does state that he went 3 hours on the side of the road this morning but not sure how he got there.  Clinical Impression   Pt admitted with above diagnosis. 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 Pt will benefit from skilled PT to increase their independence and safety with mobility to allow discharge to the venue listed below.       Follow Up Recommendations No PT follow up    Equipment Recommendations  None recommended by PT    Recommendations for Other Services       Precautions / Restrictions Precautions Precautions: None      Mobility  Bed Mobility Overal bed mobility: Independent                  Transfers Overall transfer level: Modified independent               General transfer comment: Noted push up with hands, but no assist or supervision needed  Ambulation/Gait Ambulation/Gait assistance: Min guard;Supervision Gait Distance (Feet): 200 Feet Assistive  device: None Gait Pattern/deviations: Decreased stance time - left     General Gait Details: No overt loss of balance, but noted he is spending more time in R stance  Stairs            Wheelchair Mobility    Modified Rankin (Stroke Patients Only)       Balance                                             Pertinent Vitals/Pain Pain Assessment: No/denies pain    Home Living Family/patient expects to be discharged to:: Private residence Living Arrangements: Spouse/significant other Available Help at Discharge: Family;Available 24 hours/day Type of Home: House Home Access: Stairs to enter Entrance Stairs-Rails: Can reach both Entrance Stairs-Number of Steps: 2 Home Layout: One level        Prior Function Level of Independence: Independent         Comments: Drives; 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 -- their daughter passed earlier this year     Hand Dominance        Extremity/Trunk Assessment   Upper Extremity Assessment Upper Extremity Assessment: Overall WFL for tasks assessed    Lower Extremity Assessment Lower Extremity Assessment: Overall WFL for tasks assessed (Pt tells me he broke his  L LE years ago, and that is why he has gait assymetries)       Communication   Communication: No difficulties  Cognition Arousal/Alertness: Awake/alert Behavior During Therapy: WFL for tasks assessed/performed Overall Cognitive Status: Within Functional Limits for tasks assessed                                 General Comments: became tearful while talking about incident      General Comments      Exercises     Assessment/Plan    PT Assessment Patient needs continued PT services  PT Problem List Decreased activity tolerance;Decreased balance       PT Treatment Interventions DME instruction;Gait training;Stair training;Functional mobility training    PT Goals (Current goals can be found  in the Care Plan section)  Acute Rehab PT Goals Patient Stated Goal: Hopes to be home soon PT Goal Formulation: With patient Time For Goal Achievement: 11/15/20 Potential to Achieve Goals: Good    Frequency Min 3X/week   Barriers to discharge        Co-evaluation               AM-PAC PT "6 Clicks" Mobility  Outcome Measure Help needed turning from your back to your side while in a flat bed without using bedrails?: None Help needed moving from lying on your back to sitting on the side of a flat bed without using bedrails?: None Help needed moving to and from a bed to a chair (including a wheelchair)?: A Little Help needed standing up from a chair using your arms (e.g., wheelchair or bedside chair)?: None Help needed to walk in hospital room?: None Help needed climbing 3-5 steps with a railing? : A Little 6 Click Score: 22    End of Session Equipment Utilized During Treatment: Gait belt Activity Tolerance: Patient tolerated treatment well Patient left: in chair;with call bell/phone within reach Nurse Communication: Mobility status PT Visit Diagnosis: Other abnormalities of gait and mobility (R26.89)    Time: 1202-1224 PT Time Calculation (min) (ACUTE ONLY): 22 min   Charges:   PT Evaluation $PT Eval Low Complexity: [place], PT  Acute Rehabilitation Services Pager [phone] Office [phone]   Colletta [place] 11/08/2020, 2:31 PM

## 2020-11-08 NOTE — Progress Notes (Addendum)
PROGRESS NOTE    Phillip [name]  [id number] DOB: 12-16-1955 DOA: 11/06/2020 PCP: Raina Mina., MD    Chief Complaint  Patient presents with  . Fever  . Weakness    Brief Narrative:  Phillip [name] is an 65 y.o. male with medical history significant ofchronic benign systolic and diastolic CHF (EF [id number], G3 DD by TTE 05/12/2019), CKD stage IV, CAD s/p CABG, PVD, IDT2DM, HTN, HLD, anemia of chronic disease, and hypothyroidism who presents to the ED forunclear reasons but found to have fever and worsening kidney failure.   Assessment & Plan:   Active Problems:   Type 2 diabetes mellitus with stage 3 chronic kidney disease, with long-term current use of insulin (HCC)   Bilateral lower extremity edema   Chronic combined systolic and diastolic CHF (congestive heart failure) (HCC)   Acute kidney injury superimposed on CKD (HCC)   Fever  1 fever/??  Unresponsiveness -It is noted that patient stated was in his car for approximately 3 hours however denied any syncopal episode or loss of consciousness. -Felt likely secondary to dehydration and hypotension as blood pressure noted to be borderline on admission. -Patient with no focal neurological symptoms -No nuchal rigidity. -CT abdomen and pelvis unremarkable. -Chest x-ray negative for any acute infiltrate. -Urinalysis negative nitrite, negative leukocytes, 0-5 WBCs. -Urine cultures with multiple species present. -Blood cultures pending. -COVID-19 PCR negative, influenza A and B negative. -Fever curve trending down. -Patient with a increasing leukocytosis. -Continue empiric vancomycin and cefepime. -If ongoing fevers or worsening leukocytosis despite empiric IV antibiotics and blood cultures remain negative we will need to consult with ID for further evaluation and management.  2.  Acute kidney injury on chronic kidney disease stage IV -Felt likely secondary to prerenal azotemia in the setting of diuretics, ARB,  metolazone. -Urinalysis with 100 protein, nitrite negative, leukocytes negative.  Check urine sodium, urine creatinine. -CT abdomen and pelvis negative for hydronephrosis. -Urine output recorded of 300 cc over the past 24 hours. -Creatinine slowly trending down currently at 3.51 from 3.54 on admission -Creatinine noted at 2.72 (02/22/2020). -Continue gentle hydration. -Avoid nephrotoxins.  3.  Chronic combined systolic and diastolic CHF/coronary artery disease status post CABG -Patient noted to be dehydrated on admission. -Patient with acute kidney injury and currently ARF and Bumex on hold. -Continue gentle hydration -Continue Coreg, aspirin, 6. -Resume 1/2 home dose have Imdur. -Resume home regimen clonidine. -Strict I's and O's, daily weights.  4.  Reported bilateral lower extremity edema -No edema noted on lower extremities.  5.  Diabetes mellitus type 2  -Hemoglobin A1c 9.2 (02/19/2020). -CBG 150. -Continue SSI. -Continue to hold Levemir. -Continue Neurontin  6. hypothyroidism -Continue home dose Synthroid.    DVT prophylaxis: Heparin Code Status: Full Family Communication: Updated patient, updated wife Phillip [name] on telephone.  Disposition:   Status is: Inpatient    Dispo: The patient is from: Home              Anticipated d/c is to: Home              Patient currently being worked up for fever, on IV antibiotics, not stable for discharge.   Difficult to place patient no       Consultants:   None  Procedures:   CT abdomen and pelvis 11/06/2020  Chest x-ray 11/06/2020    Antimicrobials:   IV cefepime 11/07/2020>>>>>  IV vancomycin 11/07/2020>>>>   Subjective: Patient laying in bed.  Denies any chest pain.  No  shortness of breath.  Alert and oriented to self place and time.  Feeling better than on admission.  Denies any syncopal episode just prior to admission.  States had some lightheadedness and dizziness at the onset of his symptoms.  States he  feels he is ready to go home.  Objective: Vitals:   11/08/20 0000 11/08/20 0430 11/08/20 0800 11/08/20 1100  BP: (!) 157/74 (!) 162/99 (!) 166/78 (!) 161/86  Pulse: 86 92 87 87  Resp: '18 18 18 18  '$ Temp: 99.6 F (37.6 C) 98.9 F (37.2 C) (!) 100.6 F (38.1 C) 99.5 F (37.5 C)  TempSrc: Oral Oral Oral Oral  SpO2: 98% 98% 97% 100%  Weight:  95.1 kg    Height:        Intake/Output Summary (Last 24 hours) at 11/08/2020 1155 Last data filed at 11/08/2020 0635 Gross per 24 hour  Intake 1701.6 ml  Output 300 ml  Net 1401.6 ml   Filed Weights   11/06/20 1409 11/07/20 0619 11/08/20 0430  Weight: 91.2 kg 91.1 kg 95.1 kg    Examination:  General exam: Appears calm and comfortable  Respiratory system: Clear to auscultation. Respiratory effort normal. Cardiovascular system: S1 & S2 heard, RRR. No JVD, murmurs, rubs, gallops or clicks. No pedal edema. Gastrointestinal system: Abdomen is nondistended, soft and nontender. No organomegaly or masses felt. Normal bowel sounds heard. Central nervous system: Alert and oriented. No focal neurological deficits. Extremities: Status post left first toe amputation.  Symmetric 5 x 5 power. Skin: No rashes, lesions or ulcers Psychiatry: Judgement and insight appear normal. Mood & affect appropriate.     Data Reviewed: I have personally reviewed following labs and imaging studies  CBC: Recent Labs  Lab 11/06/20 1438 11/07/20 0653 11/08/20 0537  WBC 12.9* 14.8* 18.3*  HGB 12.8* 11.9* 12.6*  HCT 39.7 36.3* 38.8*  MCV 88.6 87.9 89.0  PLT 178 129* 122*    Basic Metabolic Panel: Recent Labs  Lab 11/06/20 1438 11/07/20 0653 11/07/20 1730 11/08/20 0537  NA 135 136  --  138  K 5.1 3.9  --  4.0  CL 98 99  --  103  CO2 24 24  --  20*  GLUCOSE 193* 148*  --  145*  BUN 73* 79*  --  72*  CREATININE 3.54* 3.57*  --  3.51*  CALCIUM 9.0 8.7*  --  8.6*  MG  --   --  1.6* 1.7    GFR: Estimated Creatinine Clearance: 25.1 mL/min (A) (by C-G  formula based on SCr of 3.51 mg/dL (H)).  Liver Function Tests: Recent Labs  Lab 11/06/20 1438 11/07/20 0653  AST 24 15  ALT 14 11  ALKPHOS 70 61  BILITOT 1.3* 1.4*  PROT 7.8 7.2  ALBUMIN 3.5 3.2*    CBG: Recent Labs  Lab 11/07/20 1119 11/07/20 1653 11/07/20 2138 11/08/20 0557 11/08/20 1053  GLUCAP 214* 172* 152* 150* 131*     Recent Results (from the past 240 hour(s))  Resp Panel by RT-PCR (Flu A&B, Covid) Nasopharyngeal Swab     Status: None   Collection Time: 11/06/20  2:30 PM   Specimen: Nasopharyngeal Swab; Nasopharyngeal(NP) swabs in vial transport medium  Result Value Ref Range Status   SARS Coronavirus 2 by RT PCR NEGATIVE NEGATIVE Final    Comment: (NOTE) SARS-CoV-2 target nucleic acids are NOT DETECTED.  The SARS-CoV-2 RNA is generally detectable in upper respiratory specimens during the acute phase of infection. The lowest concentration of SARS-CoV-2 viral  copies this assay can detect is 138 copies/mL. A negative result does not preclude SARS-Cov-2 infection and should not be used as the sole basis for treatment or other patient management decisions. A negative result may occur with  improper specimen collection/handling, submission of specimen other than nasopharyngeal swab, presence of viral mutation(s) within the areas targeted by this assay, and inadequate number of viral copies(<138 copies/mL). A negative result must be combined with clinical observations, patient history, and epidemiological information. The expected result is Negative.  Fact Sheet for Patients:  EntrepreneurPulse.com.au  Fact Sheet for Healthcare Providers:  IncredibleEmployment.be  This test is no t yet approved or cleared by the [place] FDA and  has been authorized for detection and/or diagnosis of SARS-CoV-2 by FDA under an Emergency Use Authorization (EUA). This EUA will remain  in effect (meaning this test can be used) for the  duration of the COVID-19 declaration under Section 564(b)(1) of the Act, 21 U.S.C.section 360bbb-3(b)(1), unless the authorization is terminated  or revoked sooner.       Influenza A by PCR NEGATIVE NEGATIVE Final   Influenza B by PCR NEGATIVE NEGATIVE Final    Comment: (NOTE) The Xpert Xpress SARS-CoV-2/FLU/RSV plus assay is intended as an aid in the diagnosis of influenza from Nasopharyngeal swab specimens and should not be used as a sole basis for treatment. Nasal washings and aspirates are unacceptable for Xpert Xpress SARS-CoV-2/FLU/RSV testing.  Fact Sheet for Patients: EntrepreneurPulse.com.au  Fact Sheet for Healthcare Providers: IncredibleEmployment.be  This test is not yet approved or cleared by the [place] FDA and has been authorized for detection and/or diagnosis of SARS-CoV-2 by FDA under an Emergency Use Authorization (EUA). This EUA will remain in effect (meaning this test can be used) for the duration of the COVID-19 declaration under Section 564(b)(1) of the Act, 21 U.S.C. section 360bbb-3(b)(1), unless the authorization is terminated or revoked.  Performed at [facility], [place] [address], [place], [place] [address]   Culture, Urine     Status: Abnormal   Collection Time: 11/07/20  4:57 AM   Specimen: Urine, Random  Result Value Ref Range Status   Specimen Description URINE, RANDOM  Final   Special Requests   Final    NONE Performed at [facility], [place] [address], [place], [place] [address]    Culture MULTIPLE SPECIES PRESENT, SUGGEST RECOLLECTION (A)  Final   Report Status 11/08/2020 FINAL  Final  Culture, blood (routine x 2)     Status: None (Preliminary result)   Collection Time: 11/07/20  6:45 AM   Specimen: BLOOD  Result Value Ref Range Status   Specimen Description BLOOD RIGHT ANTECUBITAL  Final   Special Requests   Final    AEROBIC BOTTLE ONLY Blood Culture results may not be optimal due to  an inadequate volume of blood received in culture bottles   Culture   Final    NO GROWTH < 24 HOURS Performed at [facility], [place] [address], [place], [place] [address]    Report Status PENDING  Incomplete  Culture, blood (routine x 2)     Status: None (Preliminary result)   Collection Time: 11/07/20  6:53 AM   Specimen: BLOOD  Result Value Ref Range Status   Specimen Description BLOOD BLOOD LEFT HAND  Final   Special Requests   Final    BOTTLES DRAWN AEROBIC AND ANAEROBIC Blood Culture adequate volume   Culture   Final    NO GROWTH <  24 HOURS Performed at [facility], [place] [address] [place], [place] [address]    Report Status PENDING  Incomplete         Radiology Studies: CT Abdomen Pelvis Wo Contrast  Result Date: 11/06/2020 CLINICAL DATA:  Generalized abdominal pain EXAM: CT ABDOMEN AND PELVIS WITHOUT CONTRAST TECHNIQUE: Multidetector CT imaging of the abdomen and pelvis was performed following the standard protocol without IV contrast. COMPARISON:  10/05/2018 FINDINGS: Lower chest: 5 mm nodule within the periphery of the left lower lobe is stable from prior. Included lung bases are otherwise clear. Heart size is mildly enlarged. Hepatobiliary: Layering stones and sludge within the gallbladder lumen. No pericholecystic inflammatory changes by CT. Unremarkable unenhanced appearance of the liver. No biliary dilatation. Pancreas: Unremarkable. No pancreatic ductal dilatation or surrounding inflammatory changes. Spleen: Normal in size without focal abnormality. Adrenals/Urinary Tract: Adrenal glands are unremarkable. Kidneys are normal, without renal calculi, focal lesion, or hydronephrosis. Bladder is unremarkable. Stomach/Bowel: Stomach is within normal limits. Appendix is surgically absent. No evidence of bowel wall thickening, distention, or inflammatory changes. Vascular/Lymphatic: Atherosclerosis throughout the aortoiliac axis. No aneurysm. No abdominopelvic  lymphadenopathy. Reproductive: Prostate is unremarkable. Other: No free fluid. No abdominopelvic fluid collection. No pneumoperitoneum. No abdominal wall hernia. Musculoskeletal: Chronic bilateral L5 pars interarticularis defects with grade 1 anterolisthesis of L5 on S1. There is degenerative disc disease at the L5-S1 level. Bilateral hip arthropathy, right worse than left. IMPRESSION: 1. No acute abdominopelvic findings. 2. Cholelithiasis without evidence of acute cholecystitis. 3. Chronic bilateral L5 pars interarticularis defects with grade 1 anterolisthesis of L5 on S1. Aortic Atherosclerosis (ICD10-I70.0). Electronically Signed   By: Davina Poke D.O.   On: 11/06/2020 16:51   DG Chest Port 1 View  Result Date: 11/06/2020 CLINICAL DATA:  Weakness, fever EXAM: PORTABLE CHEST 1 VIEW COMPARISON:  Portable exam 1552 hours compared to 09/26/2020 FINDINGS: Borderline enlargement of cardiac silhouette post CABG. Mediastinal contours and pulmonary vascularity normal. Minimal bibasilar atelectasis. Lungs otherwise clear. No acute infiltrate, pleural effusion, or pneumothorax IMPRESSION: Post CABG. Minimal bibasilar atelectasis. Electronically Signed   By: Lavonia Dana M.D.   On: 11/06/2020 16:04        Scheduled Meds: . aspirin EC  81 mg Oral Daily  . carvedilol  6.25 mg Oral BID WC  . clopidogrel  75 mg Oral Daily  . gabapentin  300 mg Oral BID  . heparin  5,000 Units Subcutaneous Q8H  . insulin aspart  0-5 Units Subcutaneous QHS  . insulin aspart  0-9 Units Subcutaneous TID WC  . levothyroxine  25 mcg Oral Q0600  . tamsulosin  0.4 mg Oral Daily  . vancomycin variable dose per unstable renal function (pharmacist dosing)   Does not apply See admin instructions  . vitamin B-12  500 mcg Oral Daily   Continuous Infusions: . sodium chloride 75 mL/hr at 11/08/20 0458  . ceFEPime (MAXIPIME) IV    . magnesium sulfate bolus IVPB 2 g (11/08/20 1153)     LOS: 1 day    Time spent: 40  minutes    Irine Seal, MD [facility]   To contact the attending provider between 7A-7P or the covering provider during after hours 7P-7A, please log into the web site [email or web address] and access using universal [place] password for that web site. If you do not have the password, please call the hospital operator.  11/08/2020, 11:55 AM

## 2020-11-09 LAB — BASIC METABOLIC PANEL
Anion gap: 10 (ref 5–15)
BUN: 78 mg/dL — ABNORMAL HIGH (ref 8–23)
CO2: 23 mmol/L (ref 22–32)
Calcium: 8.6 mg/dL — ABNORMAL LOW (ref 8.9–10.3)
Chloride: 105 mmol/L (ref 98–111)
Creatinine, Ser: 3.53 mg/dL — ABNORMAL HIGH (ref 0.61–1.24)
GFR, Estimated: 18 mL/min — ABNORMAL LOW (ref >60)
Glucose, Bld: 127 mg/dL — ABNORMAL HIGH (ref 70–99)
Potassium: 3.9 mmol/L (ref 3.5–5.1)
Sodium: 138 mmol/L (ref 135–145)

## 2020-11-09 LAB — CBC
HCT: 34 % — ABNORMAL LOW (ref 39.0–52.0)
Hemoglobin: 10.9 g/dL — ABNORMAL LOW (ref 13.0–17.0)
MCH: 28.5 pg (ref 26.0–34.0)
MCHC: 32.1 g/dL (ref 30.0–36.0)
MCV: 89 fL (ref 80.0–100.0)
Platelets: 114 10*3/uL — ABNORMAL LOW (ref 150–400)
RBC: 3.82 MIL/uL — ABNORMAL LOW (ref 4.22–5.81)
RDW: 13.7 % (ref 11.5–15.5)
WBC: 13 10*3/uL — ABNORMAL HIGH (ref 4.0–10.5)
nRBC: 0 % (ref 0.0–0.2)

## 2020-11-09 LAB — GLUCOSE, CAPILLARY
Glucose-Capillary: 119 mg/dL — ABNORMAL HIGH (ref 70–99)
Glucose-Capillary: 159 mg/dL — ABNORMAL HIGH (ref 70–99)
Glucose-Capillary: 154 mg/dL — ABNORMAL HIGH (ref 70–99)
Glucose-Capillary: 233 mg/dL — ABNORMAL HIGH (ref 70–99)
Glucose-Capillary: 210 mg/dL — ABNORMAL HIGH (ref 70–99)
Glucose-Capillary: 176 mg/dL — ABNORMAL HIGH (ref 70–99)

## 2020-11-09 LAB — MRSA PCR SCREENING: MRSA by PCR: NEGATIVE

## 2020-11-09 LAB — MAGNESIUM: Magnesium: 2.2 mg/dL (ref 1.7–2.4)

## 2020-11-09 LAB — VANCOMYCIN, RANDOM: Vancomycin Rm: 16

## 2020-11-09 MED ORDER — INSULIN DETEMIR 100 UNIT/ML ~~LOC~~ SOLN
10.0000 [IU] | Freq: Every day | SUBCUTANEOUS | Status: DC
  Administered 2020-11-09: 10 [IU] via SUBCUTANEOUS
  Filled 2020-11-09 (×2): qty 0.1

## 2020-11-09 MED ORDER — VANCOMYCIN HCL 1000 MG/200ML IV SOLN
1000.0000 mg | Freq: Once | INTRAVENOUS | Status: AC
  Administered 2020-11-09: 1000 mg via INTRAVENOUS
  Filled 2020-11-09: qty 200

## 2020-11-09 NOTE — Progress Notes (Signed)
Pharmacy Antibiotic Note  Phillip [name] is a 65 y.o. male admitted on 11/06/2020 with concern for sepsis.  Pharmacy has been consulted for Vancomycin + Cefepime dosing.  Vancomycin random level = 16 after ~41 hours since loading dose. SCr is stable at 3.5. WBC is trending down.   Plan: - Vancomycin '1000mg'$  IV x1 for now. - Watch SCr, check MRSA PCR, and f/up ID plan of care - No standing Vanc for now - Continue Cefepime 2g IV every 24 hours - Will continue to follow renal function, culture results, LOT, and antibiotic de-escalation plans   Height: 6' (182.9 cm) Weight: 93.8 kg (206 lb 12.7 oz) IBW/kg (Calculated) : 77.6  Temp (24hrs), Avg:99.4 F (37.4 C), Min:98.7 F (37.1 C), Max:100.1 F (37.8 C)  Recent Labs  Lab 11/06/20 1438 11/07/20 0653 11/08/20 0537 11/09/20 0544  WBC 12.9* 14.8* 18.3* 13.0*  CREATININE 3.54* 3.57* 3.51* 3.53*  LATICACIDVEN 1.7  --   --   --   VANCORANDOM  --   --   --  16    Estimated Creatinine Clearance: 24.8 mL/min (A) (by C-G formula based on SCr of 3.53 mg/dL (H)).    No Known Allergies  Antimicrobials this admission: Vanc 6/7 >> Cefepime 6/7 >>  Dose adjustments this admission: n/a  Microbiology results: 6/6 Flu/COVID >> neg 6/6 BCx >> 6/6 UCx >>  Thank you for allowing pharmacy to be a part of this patient's care.  Sloan Leiter, PharmD, BCPS, BCCCP Clinical Pharmacist Please refer to [facility] for [place] numbers 11/09/2020 8:58 AM   **Pharmacist phone directory can now be found on [email or web address] ([email or web address]).  Listed under [place].

## 2020-11-09 NOTE — Progress Notes (Addendum)
PROGRESS NOTE    Phillip [name]  [id number] DOB: [birth year] DOA: 11/06/2020 PCP: Raina Mina., MD    Chief Complaint  Patient presents with   Fever   Weakness    Brief Narrative:  Phillip [name] is an 65 y.o. male with medical history significant of chronic benign systolic and diastolic CHF (EF [id number], G3 DD by TTE 05/12/2019), CKD stage IV, CAD s/p CABG, PVD, IDT2DM, HTN, HLD, anemia of chronic disease, and hypothyroidism who presents to the ED for unclear reasons but found to have fever and worsening kidney failure.   Assessment & Plan:   Active Problems:   Type 2 diabetes mellitus with stage 3 chronic kidney disease, with long-term current use of insulin (HCC)   Bilateral lower extremity edema   Chronic combined systolic and diastolic CHF (congestive heart failure) (HCC)   Acute kidney injury superimposed on CKD (HCC)   Fever  1 fever/??  Unresponsiveness -It is noted that patient stated was in his car for approximately 3 hours however denied any syncopal episode or loss of consciousness. -Felt likely secondary to dehydration and hypotension as blood pressure noted to be borderline on admission. -Patient with no focal neurological symptoms -No nuchal rigidity. -CT abdomen and pelvis unremarkable. -Chest x-ray negative for any acute infiltrate. -Urinalysis negative nitrite, negative leukocytes, 0-5 WBCs. -Urine cultures with multiple species present. -Blood cultures pending with no growth to date x2 days. -COVID-19 PCR negative, influenza A and B negative. -Fever curve trending down. -Patient with leukocytosis trending back down.  -MRSA PCR negative. -Discontinue vancomycin. -Continue IV cefepime. -If blood cultures remain negative tomorrow we will transition to oral antibiotics to complete a 5-day course of antibiotic treatment. -If ongoing fevers or worsening leukocytosis despite empiric IV antibiotics and blood cultures remain negative we will need to consult  with ID for further evaluation and management.  2.  Acute kidney injury on chronic kidney disease stage IV -Felt likely secondary to prerenal azotemia in the setting of diuretics, ARB, metolazone. -Urinalysis with 100 protein, nitrite negative, leukocytes negative.  Check urine sodium, urine creatinine. -CT abdomen and pelvis negative for hydronephrosis. -Urine output recorded of 500 cc over the past 24 hours. -Creatinine seems to be plateauing around 3.53 today from 3.51 from 3.54 on admission. -Creatinine noted at 2.72 (02/22/2020). -Creatinine noted at 2.70 approximately a month ago per care everywhere. -Increase IV fluids to 125 cc an hour for the next 24 hours. -Avoid nephrotoxins.  3.  Chronic combined systolic and diastolic CHF/coronary artery disease status post CABG -Patient noted to be dehydrated on admission. -Patient with acute kidney injury and currently ARF and Bumex on hold. -Continue gentle hydration for another 24 hours -Continue Coreg, aspirin, Plavix, 1/2 home dose Imdur, clonidine.  -Monitor closely for volume overload.   -Strict I's and O's.  Daily weights.   4.  Reported bilateral lower extremity edema -No edema noted on lower extremities.  5.  Diabetes mellitus type 2  -Hemoglobin A1c 9.2 (02/19/2020). -CBG 154 this morning. -Continue SSI, Neurontin.  -Resume half home dose Levemir of 10 units daily.  6. hypothyroidism -Synthroid.     DVT prophylaxis: Heparin Code Status: Full Family Communication: Updated patient, updated wife Faraz Pittsenbarger on telephone.  Disposition:   Status is: Inpatient    Dispo: The patient is from: Home              Anticipated d/c is to: Home  Patient currently being worked up for fever, on IV antibiotics, not stable for discharge.   Difficult to place patient no       Consultants:  None  Procedures:  CT abdomen and pelvis 11/06/2020 Chest x-ray 11/06/2020   Antimicrobials:  IV cefepime 11/07/2020>>>>> IV  vancomycin 11/07/2020>>>> 11/09/2020   Subjective: Sitting up in chair eating his lunch.  States he is ready to go home.  Denies any chest pain.  No shortness of breath.  Overall feeling better.  Urinating okay.  Afebrile.  Alert and oriented to self place and time.    Objective: Vitals:   11/08/20 1100 11/08/20 1714 11/09/20 0014 11/09/20 0431  BP: (!) 161/86 (!) 168/91 138/80 134/72  Pulse: 87 89 89 76  Resp: '18 16 18 18  '$ Temp: 99.5 F (37.5 C) 100.1 F (37.8 C) 99.2 F (37.3 C) 98.7 F (37.1 C)  TempSrc: Oral Oral Oral Oral  SpO2: 100% 100% 97% 97%  Weight:    93.8 kg  Height:        Intake/Output Summary (Last 24 hours) at 11/09/2020 1142 Last data filed at 11/08/2020 2030 Gross per 24 hour  Intake 934.21 ml  Output 500 ml  Net 434.21 ml    Filed Weights   11/07/20 0619 11/08/20 0430 11/09/20 0431  Weight: 91.1 kg 95.1 kg 93.8 kg    Examination:  General exam: : NAD Respiratory system: CTA B anterior lung fields.  No wheezes, no rhonchi.  Speaking in full sentences.  Normal respiratory effort. Cardiovascular system: Regular rate and rhythm no murmurs rubs or gallops.  No JVD.  No lower extremity edema.  Gastrointestinal system: Abdomen soft, nontender, nondistended, positive bowel sounds.  No rebound.  No guarding. Central nervous system: Alert and oriented. No focal neurological deficits. Extremities: Status post left first toe amputation. 5/5 bilateral upper and lower extremity strength. Skin: No rashes, lesions or ulcers Psychiatry: Judgement and insight appear normal. Mood & affect appropriate.   Data Reviewed: I have personally reviewed following labs and imaging studies  CBC: Recent Labs  Lab 11/06/20 1438 11/07/20 0653 11/08/20 0537 11/09/20 0544  WBC 12.9* 14.8* 18.3* 13.0*  HGB 12.8* 11.9* 12.6* 10.9*  HCT 39.7 36.3* 38.8* 34.0*  MCV 88.6 87.9 89.0 89.0  PLT 178 129* 122* 114*     Basic Metabolic Panel: Recent Labs  Lab 11/06/20 1438  11/07/20 0653 11/07/20 1730 11/08/20 0537 11/09/20 0544  NA 135 136  --  138 138  K 5.1 3.9  --  4.0 3.9  CL 98 99  --  103 105  CO2 24 24  --  20* 23  GLUCOSE 193* 148*  --  145* 127*  BUN 73* 79*  --  72* 78*  CREATININE 3.54* 3.57*  --  3.51* 3.53*  CALCIUM 9.0 8.7*  --  8.6* 8.6*  MG  --   --  1.6* 1.7 2.2     GFR: Estimated Creatinine Clearance: 24.8 mL/min (A) (by C-G formula based on SCr of 3.53 mg/dL (H)).  Liver Function Tests: Recent Labs  Lab 11/06/20 1438 11/07/20 0653  AST 24 15  ALT 14 11  ALKPHOS 70 61  BILITOT 1.3* 1.4*  PROT 7.8 7.2  ALBUMIN 3.5 3.2*     CBG: Recent Labs  Lab 11/08/20 1717 11/08/20 2139 11/09/20 0603 11/09/20 0719 11/09/20 1055  GLUCAP 140* 159* 119* 159* 154*      Recent Results (from the past 240 hour(s))  Resp Panel by RT-PCR (Flu A&B,  Covid) Nasopharyngeal Swab     Status: None   Collection Time: 11/06/20  2:30 PM   Specimen: Nasopharyngeal Swab; Nasopharyngeal(NP) swabs in vial transport medium  Result Value Ref Range Status   SARS Coronavirus 2 by RT PCR NEGATIVE NEGATIVE Final    Comment: (NOTE) SARS-CoV-2 target nucleic acids are NOT DETECTED.  The SARS-CoV-2 RNA is generally detectable in upper respiratory specimens during the acute phase of infection. The lowest concentration of SARS-CoV-2 viral copies this assay can detect is 138 copies/mL. A negative result does not preclude SARS-Cov-2 infection and should not be used as the sole basis for treatment or other patient management decisions. A negative result may occur with  improper specimen collection/handling, submission of specimen other than nasopharyngeal swab, presence of viral mutation(s) within the areas targeted by this assay, and inadequate number of viral copies(<138 copies/mL). A negative result must be combined with clinical observations, patient history, and epidemiological information. The expected result is Negative.  Fact Sheet for  Patients:  EntrepreneurPulse.com.au  Fact Sheet for Healthcare Providers:  IncredibleEmployment.be  This test is no t yet approved or cleared by the [place] FDA and  has been authorized for detection and/or diagnosis of SARS-CoV-2 by FDA under an Emergency Use Authorization (EUA). This EUA will remain  in effect (meaning this test can be used) for the duration of the COVID-19 declaration under Section 564(b)(1) of the Act, 21 U.S.C.section 360bbb-3(b)(1), unless the authorization is terminated  or revoked sooner.       Influenza A by PCR NEGATIVE NEGATIVE Final   Influenza B by PCR NEGATIVE NEGATIVE Final    Comment: (NOTE) The Xpert Xpress SARS-CoV-2/FLU/RSV plus assay is intended as an aid in the diagnosis of influenza from Nasopharyngeal swab specimens and should not be used as a sole basis for treatment. Nasal washings and aspirates are unacceptable for Xpert Xpress SARS-CoV-2/FLU/RSV testing.  Fact Sheet for Patients: EntrepreneurPulse.com.au  Fact Sheet for Healthcare Providers: IncredibleEmployment.be  This test is not yet approved or cleared by the [place] FDA and has been authorized for detection and/or diagnosis of SARS-CoV-2 by FDA under an Emergency Use Authorization (EUA). This EUA will remain in effect (meaning this test can be used) for the duration of the COVID-19 declaration under Section 564(b)(1) of the Act, 21 U.S.C. section 360bbb-3(b)(1), unless the authorization is terminated or revoked.  Performed at [facility], [place] [address], [place], [place] [address]   Culture, Urine     Status: Abnormal   Collection Time: 11/07/20  4:57 AM   Specimen: Urine, Random  Result Value Ref Range Status   Specimen Description URINE, RANDOM  Final   Special Requests   Final    NONE Performed at [facility], [place] [address], [place], [place] [address]    Culture  MULTIPLE SPECIES PRESENT, SUGGEST RECOLLECTION (A)  Final   Report Status 11/08/2020 FINAL  Final  Culture, blood (routine x 2)     Status: None (Preliminary result)   Collection Time: 11/07/20  6:45 AM   Specimen: BLOOD  Result Value Ref Range Status   Specimen Description BLOOD RIGHT ANTECUBITAL  Final   Special Requests   Final    AEROBIC BOTTLE ONLY Blood Culture results may not be optimal due to an inadequate volume of blood received in culture bottles   Culture   Final    NO GROWTH 2 DAYS Performed at [facility], [place] [address], [place], [place] [address]  Report Status PENDING  Incomplete  Culture, blood (routine x 2)     Status: None (Preliminary result)   Collection Time: 11/07/20  6:53 AM   Specimen: BLOOD  Result Value Ref Range Status   Specimen Description BLOOD BLOOD LEFT HAND  Final   Special Requests   Final    BOTTLES DRAWN AEROBIC AND ANAEROBIC Blood Culture adequate volume   Culture   Final    NO GROWTH 2 DAYS Performed at [facility], [address] [place], [place] [address]    Report Status PENDING  Incomplete  MRSA PCR Screening     Status: None   Collection Time: 11/09/20  8:54 AM   Specimen: Nasal Mucosa; Nasopharyngeal  Result Value Ref Range Status   MRSA by PCR NEGATIVE NEGATIVE Final    Comment:        The GeneXpert MRSA Assay (FDA approved for NASAL specimens only), is one component of a comprehensive MRSA colonization surveillance program. It is not intended to diagnose MRSA infection nor to guide or monitor treatment for MRSA infections. Performed at [facility], [place] [address] [place], [place] [address]          Radiology Studies: No results found.       Scheduled Meds:  aspirin EC  81 mg Oral Daily   carvedilol  6.25 mg Oral BID WC   cloNIDine  0.3 mg Oral QHS   clopidogrel  75 mg Oral Daily   gabapentin  300 mg Oral BID   heparin  5,000 Units Subcutaneous Q8H   insulin aspart  0-5 Units  Subcutaneous QHS   insulin aspart  0-9 Units Subcutaneous TID WC   isosorbide mononitrate  60 mg Oral Daily   levothyroxine  25 mcg Oral Q0600   tamsulosin  0.4 mg Oral Daily   vancomycin variable dose per unstable renal function (pharmacist dosing)   Does not apply See admin instructions   vitamin B-12  500 mcg Oral Daily   Continuous Infusions:  sodium chloride 125 mL/hr at 11/09/20 1128   ceFEPime (MAXIPIME) IV 200 mL/hr at 11/08/20 1730     LOS: 2 days    Time spent: 35 minutes    Irine Seal, MD [facility]   To contact the attending provider between 7A-7P or the covering provider during after hours 7P-7A, please log into the web site [email or web address] and access using universal [place] password for that web site. If you do not have the password, please call the hospital operator.  11/09/2020, 11:42 AM

## 2020-11-10 LAB — BASIC METABOLIC PANEL
Anion gap: 10 (ref 5–15)
BUN: 73 mg/dL — ABNORMAL HIGH (ref 8–23)
CO2: 20 mmol/L — ABNORMAL LOW (ref 22–32)
Calcium: 8.4 mg/dL — ABNORMAL LOW (ref 8.9–10.3)
Chloride: 108 mmol/L (ref 98–111)
Creatinine, Ser: 3.14 mg/dL — ABNORMAL HIGH (ref 0.61–1.24)
GFR, Estimated: 21 mL/min — ABNORMAL LOW (ref >60)
Glucose, Bld: 130 mg/dL — ABNORMAL HIGH (ref 70–99)
Potassium: 3.6 mmol/L (ref 3.5–5.1)
Sodium: 138 mmol/L (ref 135–145)

## 2020-11-10 LAB — CBC
HCT: 30.8 % — ABNORMAL LOW (ref 39.0–52.0)
Hemoglobin: 10.1 g/dL — ABNORMAL LOW (ref 13.0–17.0)
MCH: 28.9 pg (ref 26.0–34.0)
MCHC: 32.8 g/dL (ref 30.0–36.0)
MCV: 88.3 fL (ref 80.0–100.0)
Platelets: 113 10*3/uL — ABNORMAL LOW (ref 150–400)
RBC: 3.49 MIL/uL — ABNORMAL LOW (ref 4.22–5.81)
RDW: 14 % (ref 11.5–15.5)
WBC: 7.4 10*3/uL (ref 4.0–10.5)
nRBC: 0 % (ref 0.0–0.2)

## 2020-11-10 LAB — GLUCOSE, CAPILLARY
Glucose-Capillary: 130 mg/dL — ABNORMAL HIGH (ref 70–99)
Glucose-Capillary: 150 mg/dL — ABNORMAL HIGH (ref 70–99)

## 2020-11-10 LAB — HEMOGLOBIN A1C
Hgb A1c MFr Bld: 9.5 % — ABNORMAL HIGH (ref 4.8–5.6)
Mean Plasma Glucose: 226 mg/dL

## 2020-11-10 MED ORDER — CEFPODOXIME PROXETIL 200 MG PO TABS: 200.0000 mg | ORAL_TABLET | Freq: Every day | ORAL | 0 refills | Status: AC

## 2020-11-10 MED ORDER — BUMETANIDE 2 MG PO TABS: 2.0000 mg | ORAL_TABLET | Freq: Two times a day (BID) | ORAL | Status: DC

## 2020-11-10 MED ORDER — CEFDINIR 300 MG PO CAPS
300.0000 mg | ORAL_CAPSULE | Freq: Every day | ORAL | Status: DC
  Administered 2020-11-10: 300 mg via ORAL
  Filled 2020-11-10: qty 1

## 2020-11-10 MED ORDER — LOSARTAN POTASSIUM 25 MG PO TABS: 25.0000 mg | ORAL_TABLET | Freq: Every day | ORAL | Status: DC

## 2020-11-10 MED ORDER — LEVEMIR FLEXTOUCH 100 UNIT/ML ~~LOC~~ SOPN: 10.0000 [IU] | PEN_INJECTOR | Freq: Every evening | SUBCUTANEOUS | Status: DC | PRN

## 2020-11-10 NOTE — Discharge Summary (Signed)
Physician Discharge Summary  Phillip [name] [id number] DOB: Aug 14, 1955 DOA: 11/06/2020  PCP: Raina Mina., MD  Admit date: 11/06/2020 Discharge date: 11/10/2020  Time spent: 55 minutes  Recommendations for Outpatient Follow-up:  Follow-up with primary nephrologist, Dr. Neta Ehlers in 1 week.  On follow-up patient will need a renal function panel to follow-up on electrolytes, renal function and further recommendations will need to be made about patient's Bumex and ARB.  Patient was instructed to resume Bumex 1 week post discharge and ARB 5 days postdischarge. Follow-up with Raina Mina., MD in 1 to 2 weeks.  On follow-up, blood culture finalization will need to be followed up upon.  Patient also need a basic metabolic profile done to follow-up on electrolytes and renal function.  Patient's diabetes will need to be followed up upon.   Discharge Diagnoses:  Active Problems:   Type 2 diabetes mellitus with stage 3 chronic kidney disease, with long-term current use of insulin (HCC)   Bilateral lower extremity edema   Chronic combined systolic and diastolic CHF (congestive heart failure) ([place])   Acute kidney injury superimposed on CKD ([place])   Fever   Discharge Condition: Stable and improved  Diet recommendation: Carb modified diet  Filed Weights   11/08/20 0430 11/09/20 0431 11/10/20 0418  Weight: 95.1 kg 93.8 kg 96.9 kg    History of present illness:  HPI per Dr. Lorre Nick is a 65 y.o. male with medical history significant of chronic benign systolic and diastolic CHF (EF [id number], G3 DD by TTE 05/12/2019), CKD stage IV, CAD s/p CABG, PVD, IDT2DM, HTN, HLD, anemia of chronic disease, and hypothyroidism who presented to the ED for unclear reasons per patient-he does state that he went 3 hours on the side of the road this morning but not sure how he got there.  He was taking his 52 year old grandson to his day program when he apparently pulled over, was mumbling, and grandson  called for EMS after 3 hours.  Review of the ER chart shows that patient was brought in for weakness and fever.  EMS did state that the patient was in the side of road in his truck for several hours.  Temperature was 103.1 when patient was found.   Patient is difficult historian but does state that he has been having increasing swelling in his lower extremity and has been taking increasing number of Bumex per day per his PCPs advice.  He says his nephrologist tells him to take 1 mg daily but his PCP has increased to 2 mg twice daily.   In the ER patient has received IV fluids and begun to feel better.  Patient still has not urinated since this AM.  Patient also had CT scan of his abdomen pelvis without findings.  Hospital Course:  1 fever/??  Unresponsiveness -It is noted that patient stated was in his car for approximately 3 hours however denied any syncopal episode or loss of consciousness. -Felt likely secondary to dehydration and hypotension as blood pressure noted to be borderline on admission. -Patient with no focal neurological symptoms -No nuchal rigidity. -CT abdomen and pelvis unremarkable. -Chest x-ray negative for any acute infiltrate. -Urinalysis negative nitrite, negative leukocytes, 0-5 WBCs. -Urine cultures with multiple species present. -Blood cultures pending with no growth to date x3 days. -COVID-19 PCR negative, influenza A and B negative. -Fever curve trended down and patient remained afebrile. -Patient with leukocytosis that trended up as high as 18.3 and subsequently trended back down and  had resolved with a WBC of 7.4 by day of discharge.  -MRSA PCR negative. -Patient initially placed on IV vancomycin IV cefepime.   -Due to renal function patient received 1 dose of IV vancomycin.   -IV vancomycin subsequently discontinued.   -Patient improved clinically on IV cefepime.   -Patient was discharged home on 2 days of oral Vantin to complete a 5-day course of antibiotic  treatment.   -Outpatient follow-up with PCP.     2.  Acute kidney injury on chronic kidney disease stage IV -Felt likely secondary to prerenal azotemia in the setting of diuretics, ARB. -Urinalysis with 100 protein, nitrite negative, leukocytes negative.  -CT abdomen and pelvis negative for hydronephrosis. -Patient hydrated gently with IV fluids with initial plateauing of creatinine to 3.5 and subsequently improving and trending down such that by day of discharge creatinine was down to 3.14. -Creatinine noted at 2.72 (02/22/2020). -Creatinine noted at 2.70 approximately a month ago per care everywhere. -Patient remained asymptomatic.  Creatinine trended down by day of discharge.   --Patient's Bumex will be held and resumed 1 week post discharge.  ARB was held and may be resumed on 5 days post discharge. -Close outpatient follow-up with primary nephrologist 1 week post discharge.      3.  Chronic combined systolic and diastolic CHF/coronary artery disease status post CABG -Patient noted to be dehydrated on admission. -Patient with acute kidney injury and patient's ARB and Bumex were held during the hospitalization.  -Patient remained euvolemic. -Patient gently hydrated. -Patient maintained on home regimen of Coreg, aspirin, Plavix, clonidine and half home dose Imdur.. -Outpatient follow-up with PCP.   4.  Reported bilateral lower extremity edema -No significant edema noted in lower extremities during the hospitalization.     5.  Diabetes mellitus type 2 -Hemoglobin A1c 9.2 (02/19/2020). -CBG 154 this morning. -Patient was placed on half home dose Levemir 10 units daily, SSI and Neurontin.   -Outpatient follow-up with PCP   6. hypothyroidism -Patient maintained on home regimen of Synthroid.     Procedures: CT abdomen and pelvis 11/06/2020 Chest x-ray 11/06/2020  Consultations: None  Discharge Exam: Vitals:   11/10/20 0418 11/10/20 1211  BP: (!) 149/85 (!) 159/88  Pulse: 76 73   Resp: 18 16  Temp: 98.7 F (37.1 C) 98.6 F (37 C)  SpO2: 98% 100%    General: NAD Cardiovascular: RRR no murmurs rubs or gallops.  No JVD.  No lower extremity edema. Respiratory: CTA B.  No wheezes, no crackles, no rhonchi.  Normal respiratory effort.  Discharge Instructions   Discharge Instructions     Diet Carb Modified   Complete by: As directed    Increase activity slowly   Complete by: As directed       Allergies as of 11/10/2020   No Known Allergies      Medication List     STOP taking these medications    traMADol 50 MG tablet Commonly known as: ULTRAM       TAKE these medications    acetaminophen 500 MG tablet Commonly known as: TYLENOL Take 1,000 mg by mouth every 6 (six) hours as needed for moderate pain or headache.   aspirin 81 MG tablet Take 81 mg by mouth daily.   bumetanide 2 MG tablet Commonly known as: BUMEX Take 1 tablet (2 mg total) by mouth 2 (two) times daily. Start taking on: November 17, 2020 What changed: These instructions start on November 17, 2020. If you are unsure what to  do until then, ask your doctor or other care provider.   carvedilol 6.25 MG tablet Commonly known as: COREG Take 6.25 mg by mouth 2 (two) times daily with a meal.   cefpodoxime 200 MG tablet Commonly known as: VANTIN Take 1 tablet (200 mg total) by mouth daily for 2 days. Start taking on: November 11, 2020   cloNIDine 0.3 MG tablet Commonly known as: CATAPRES Take 0.3 mg by mouth at bedtime.   clopidogrel 75 MG tablet Commonly known as: Plavix Take 1 tablet (75 mg total) by mouth daily.   gabapentin 600 MG tablet Commonly known as: NEURONTIN Take 300 mg by mouth 2 (two) times daily.   isosorbide mononitrate 120 MG 24 hr tablet Commonly known as: IMDUR Take 120 mg by mouth daily.   Levemir FlexTouch 100 UNIT/ML FlexPen Generic drug: insulin detemir Inject 10-20 Units into the skin at bedtime as needed (high blood sugar). What changed:  how much to  take when to take this reasons to take this   levothyroxine 25 MCG tablet Commonly known as: SYNTHROID Take 25 mcg by mouth daily.   losartan 25 MG tablet Commonly known as: COZAAR Take 1 tablet (25 mg total) by mouth daily. Start taking on: November 15, 2020 What changed: These instructions start on November 15, 2020. If you are unsure what to do until then, ask your doctor or other care provider.   lovastatin 40 MG tablet Commonly known as: MEVACOR Take 20 mg by mouth at bedtime.   nitroGLYCERIN 0.4 MG SL tablet Commonly known as: NITROSTAT Place 0.4 mg under the tongue every 5 (five) minutes as needed for chest pain.   tamsulosin 0.4 MG Caps capsule Commonly known as: FLOMAX Take 0.4 mg by mouth daily.   vitamin B-12 500 MCG tablet Commonly known as: CYANOCOBALAMIN Take 500 mcg by mouth daily.       No Known Allergies  Follow-up Information     Raina Mina., MD. Schedule an appointment as soon as possible for a visit in 1 week(s).   Specialty: Internal Medicine Why: f/u in 1-2 weeks Contact information: [place] [address] [phone]         Nwobu, Lyman Bishop, MD. Schedule an appointment as soon as possible for a visit in 1 week(s).   Contact information: [id number] [place] [address] [phone]                  The results of significant diagnostics from this hospitalization (including imaging, microbiology, ancillary and laboratory) are listed below for reference.    Significant Diagnostic Studies: CT Abdomen Pelvis Wo Contrast  Result Date: 11/06/2020 CLINICAL DATA:  Generalized abdominal pain EXAM: CT ABDOMEN AND PELVIS WITHOUT CONTRAST TECHNIQUE: Multidetector CT imaging of the abdomen and pelvis was performed following the standard protocol without IV contrast. COMPARISON:  10/05/2018 FINDINGS: Lower chest: 5 mm nodule within the periphery of the left lower lobe is stable from prior. Included lung  bases are otherwise clear. Heart size is mildly enlarged. Hepatobiliary: Layering stones and sludge within the gallbladder lumen. No pericholecystic inflammatory changes by CT. Unremarkable unenhanced appearance of the liver. No biliary dilatation. Pancreas: Unremarkable. No pancreatic ductal dilatation or surrounding inflammatory changes. Spleen: Normal in size without focal abnormality. Adrenals/Urinary Tract: Adrenal glands are unremarkable. Kidneys are normal, without renal calculi, focal lesion, or hydronephrosis. Bladder is unremarkable. Stomach/Bowel: Stomach is within normal limits. Appendix is surgically absent. No evidence of bowel wall thickening,  distention, or inflammatory changes. Vascular/Lymphatic: Atherosclerosis throughout the aortoiliac axis. No aneurysm. No abdominopelvic lymphadenopathy. Reproductive: Prostate is unremarkable. Other: No free fluid. No abdominopelvic fluid collection. No pneumoperitoneum. No abdominal wall hernia. Musculoskeletal: Chronic bilateral L5 pars interarticularis defects with grade 1 anterolisthesis of L5 on S1. There is degenerative disc disease at the L5-S1 level. Bilateral hip arthropathy, right worse than left. IMPRESSION: 1. No acute abdominopelvic findings. 2. Cholelithiasis without evidence of acute cholecystitis. 3. Chronic bilateral L5 pars interarticularis defects with grade 1 anterolisthesis of L5 on S1. Aortic Atherosclerosis (ICD10-I70.0). Electronically Signed   By: Davina Poke D.O.   On: 11/06/2020 16:51   DG Chest Port 1 View  Result Date: 11/06/2020 CLINICAL DATA:  Weakness, fever EXAM: PORTABLE CHEST 1 VIEW COMPARISON:  Portable exam 1552 hours compared to 09/26/2020 FINDINGS: Borderline enlargement of cardiac silhouette post CABG. Mediastinal contours and pulmonary vascularity normal. Minimal bibasilar atelectasis. Lungs otherwise clear. No acute infiltrate, pleural effusion, or pneumothorax IMPRESSION: Post CABG. Minimal bibasilar  atelectasis. Electronically Signed   By: Lavonia Dana M.D.   On: 11/06/2020 16:04    Microbiology: Recent Results (from the past 240 hour(s))  Resp Panel by RT-PCR (Flu A&B, Covid) Nasopharyngeal Swab     Status: None   Collection Time: 11/06/20  2:30 PM   Specimen: Nasopharyngeal Swab; Nasopharyngeal(NP) swabs in vial transport medium  Result Value Ref Range Status   SARS Coronavirus 2 by RT PCR NEGATIVE NEGATIVE Final    Comment: (NOTE) SARS-CoV-2 target nucleic acids are NOT DETECTED.  The SARS-CoV-2 RNA is generally detectable in upper respiratory specimens during the acute phase of infection. The lowest concentration of SARS-CoV-2 viral copies this assay can detect is 138 copies/mL. A negative result does not preclude SARS-Cov-2 infection and should not be used as the sole basis for treatment or other patient management decisions. A negative result may occur with  improper specimen collection/handling, submission of specimen other than nasopharyngeal swab, presence of viral mutation(s) within the areas targeted by this assay, and inadequate number of viral copies(<138 copies/mL). A negative result must be combined with clinical observations, patient history, and epidemiological information. The expected result is Negative.  Fact Sheet for Patients:  EntrepreneurPulse.com.au  Fact Sheet for Healthcare Providers:  IncredibleEmployment.be  This test is no t yet approved or cleared by the [place] FDA and  has been authorized for detection and/or diagnosis of SARS-CoV-2 by FDA under an Emergency Use Authorization (EUA). This EUA will remain  in effect (meaning this test can be used) for the duration of the COVID-19 declaration under Section 564(b)(1) of the Act, 21 U.S.C.section 360bbb-3(b)(1), unless the authorization is terminated  or revoked sooner.       Influenza A by PCR NEGATIVE NEGATIVE Final   Influenza B by PCR NEGATIVE  NEGATIVE Final    Comment: (NOTE) The Xpert Xpress SARS-CoV-2/FLU/RSV plus assay is intended as an aid in the diagnosis of influenza from Nasopharyngeal swab specimens and should not be used as a sole basis for treatment. Nasal washings and aspirates are unacceptable for Xpert Xpress SARS-CoV-2/FLU/RSV testing.  Fact Sheet for Patients: EntrepreneurPulse.com.au  Fact Sheet for Healthcare Providers: IncredibleEmployment.be  This test is not yet approved or cleared by the [place] FDA and has been authorized for detection and/or diagnosis of SARS-CoV-2 by FDA under an Emergency Use Authorization (EUA). This EUA will remain in effect (meaning this test can be used) for the duration of the COVID-19 declaration under Section 564(b)(1) of the Act, 21 U.S.C.  section 360bbb-3(b)(1), unless the authorization is terminated or revoked.  Performed at [facility], [place] [address], [place], [place] [address]   Culture, Urine     Status: Abnormal   Collection Time: 11/07/20  4:57 AM   Specimen: Urine, Random  Result Value Ref Range Status   Specimen Description URINE, RANDOM  Final   Special Requests   Final    NONE Performed at [facility], [place] [address] [place], [place] [address]    Culture MULTIPLE SPECIES PRESENT, SUGGEST RECOLLECTION (A)  Final   Report Status 11/08/2020 FINAL  Final  Culture, blood (routine x 2)     Status: None (Preliminary result)   Collection Time: 11/07/20  6:45 AM   Specimen: BLOOD  Result Value Ref Range Status   Specimen Description BLOOD RIGHT ANTECUBITAL  Final   Special Requests   Final    AEROBIC BOTTLE ONLY Blood Culture results may not be optimal due to an inadequate volume of blood received in culture bottles   Culture   Final    NO GROWTH 3 DAYS Performed at [facility], [place] [address], [place], [place] [address]    Report Status PENDING  Incomplete  Culture, blood (routine x 2)      Status: None (Preliminary result)   Collection Time: 11/07/20  6:53 AM   Specimen: BLOOD  Result Value Ref Range Status   Specimen Description BLOOD BLOOD LEFT HAND  Final   Special Requests   Final    BOTTLES DRAWN AEROBIC AND ANAEROBIC Blood Culture adequate volume   Culture   Final    NO GROWTH 3 DAYS Performed at [facility], [place] [address], [place], [place] [address]    Report Status PENDING  Incomplete  MRSA PCR Screening     Status: None   Collection Time: 11/09/20  8:54 AM   Specimen: Nasal Mucosa; Nasopharyngeal  Result Value Ref Range Status   MRSA by PCR NEGATIVE NEGATIVE Final    Comment:        The GeneXpert MRSA Assay (FDA approved for NASAL specimens only), is one component of a comprehensive MRSA colonization surveillance program. It is not intended to diagnose MRSA infection nor to guide or monitor treatment for MRSA infections. Performed at [facility], [place] [address] [place], [place] [address]      Labs: Basic Metabolic Panel: Recent Labs  Lab 11/06/20 1438 11/07/20 0653 11/07/20 1730 11/08/20 0537 11/09/20 0544 11/10/20 0448  NA 135 136  --  138 138 138  K 5.1 3.9  --  4.0 3.9 3.6  CL 98 99  --  103 105 108  CO2 24 24  --  20* 23 20*  GLUCOSE 193* 148*  --  145* 127* 130*  BUN 73* 79*  --  72* 78* 73*  CREATININE 3.54* 3.57*  --  3.51* 3.53* 3.14*  CALCIUM 9.0 8.7*  --  8.6* 8.6* 8.4*  MG  --   --  1.6* 1.7 2.2  --    Liver Function Tests: Recent Labs  Lab 11/06/20 1438 11/07/20 0653  AST 24 15  ALT 14 11  ALKPHOS 70 61  BILITOT 1.3* 1.4*  PROT 7.8 7.2  ALBUMIN 3.5 3.2*   No results for input(s): LIPASE, AMYLASE in the last 168 hours. No results for input(s): AMMONIA in the last 168 hours. CBC: Recent Labs  Lab 11/06/20 1438 11/07/20 [id number] 11/08/20 0537 11/09/20 0544 11/10/20 0448  WBC 12.9* 14.8* 18.3* 13.0* 7.4  HGB 12.8* 11.9* 12.6* 10.9* 10.1*  HCT 39.7 36.3* 38.8* 34.0* 30.8*  MCV 88.6 87.9 89.0 89.0  88.3  PLT 178 129* 122* 114* 113*   Cardiac Enzymes: No results for input(s): CKTOTAL, CKMB, CKMBINDEX, TROPONINI in the last 168 hours. BNP: BNP (last 3 results) Recent Labs    02/19/20 0420  BNP 406.2*    ProBNP (last 3 results) No results for input(s): PROBNP in the last 8760 hours.  CBG: Recent Labs  Lab 11/09/20 1545 11/09/20 1705 11/09/20 2136 11/10/20 0616 11/10/20 1057  GLUCAP 233* 210* 176* 130* 150*       Signed:  Irine Seal MD.  [facility] 11/10/2020, 12:50 PM

## 2020-11-10 NOTE — Progress Notes (Signed)
Helyn Numbers to be D/C'd Home per MD order.  Discussed with the patient and all questions fully answered.   VSS, Skin clean, dry and intact without evidence of skin break down, no evidence of skin tears noted. IV catheter discontinued intact. Site without signs and symptoms of complications. Dressing and pressure applied.   An After Visit Summary was printed and given to the patient.    D/C education completed with patient/family including follow up instructions, medication list, d/c activities limitations if indicated, with other d/c instructions as indicated by MD - patient able to verbalize understanding, all questions fully answered.    Patient instructed to return to ED, call 911, or call MD for any changes in condition.    Patient escorted via [place], and D/C home via private car.

## 2020-11-10 NOTE — Plan of Care (Signed)
Pt understanding discharge instructions  

## 2020-11-10 NOTE — Progress Notes (Signed)
Physical Therapy Treatment Patient Details Name: Phillip [name] MRN: [id number] DOB: January 03, 1956 Today's Date: 11/10/2020    History of Present Illness Phillip [name] is a 65 y.o. male who was brought in on 11/07/20 for weakness and fever; pt stating he went 3 hours on the side of the road this morning but not sure how he got there. Admitted with acute kidney injury on CKD stage IV and worsening fever of unknown origin. PMH includes systolic and diastolic CHF (EF 85-46%, G3 DD by TTE 05/12/2019), CKD stage IV, CAD s/p CABG, PVD, DM, HTN, HLD, anemia.    PT Comments    Patient progressing well with mobility. Tolerated gait training and stair training today Mod I- independent. No evidence of imbalance or difficulties. Reports feeling back to baseline with regards to mobility and eager to return home. Pt has met all goals and no longer requires skilled therapy services. Encouraged continued activity/mobility while in the hospital to maintain strength. Education completed. Discharge from therapy.   Follow Up Recommendations  No PT follow up     Equipment Recommendations  None recommended by PT    Recommendations for Other Services       Precautions / Restrictions Precautions Precautions: None Restrictions Weight Bearing Restrictions: No    Mobility  Bed Mobility               General bed mobility comments: Sitting in chair upon PT arrival.    Transfers Overall transfer level: Modified independent Equipment used: None             General transfer comment: Stood from chair without difficulty; reluctant to participate in mobility.  Ambulation/Gait Ambulation/Gait assistance: Modified independent (Device/Increase time) Gait Distance (Feet): 225 Feet Assistive device: None Gait Pattern/deviations: Decreased stance time - left   Gait velocity interpretation: 1.31 - 2.62 ft/sec, indicative of limited community ambulator General Gait Details: Slow, steady gait with  decreased stance time on LLE; reports hx of leg surgery but that gait is baseline.   Stairs Stairs: Yes Stairs assistance: Modified independent (Device/Increase time) Stair Management: One rail Right;Step to pattern;Alternating pattern Number of Stairs: 4 General stair comments: Cues for technique and safety.   Wheelchair Mobility    Modified Rankin (Stroke Patients Only)       Balance Overall balance assessment: Needs assistance Sitting-balance support: Feet supported;No upper extremity supported Sitting balance-Leahy Scale: Good     Standing balance support: During functional activity Standing balance-Leahy Scale: Good                              Cognition Arousal/Alertness: Awake/alert Behavior During Therapy: WFL for tasks assessed/performed Overall Cognitive Status: Within Functional Limits for tasks assessed                                 General Comments: Eager and determined to get home.      Exercises      General Comments General comments (skin integrity, edema, etc.): Reports feeling at baseline and wants to go home, "I have too much to do!"      Pertinent Vitals/Pain Pain Assessment: No/denies pain    Home Living                      Prior Function            PT Goals (current goals  can now be found in the care plan section) Progress towards PT goals: Goals met/education completed, patient discharged from PT    Frequency    Min 3X/week      PT Plan Current plan remains appropriate    Co-evaluation              AM-PAC PT "6 Clicks" Mobility   Outcome Measure  Help needed turning from your back to your side while in a flat bed without using bedrails?: None Help needed moving from lying on your back to sitting on the side of a flat bed without using bedrails?: None Help needed moving to and from a bed to a chair (including a wheelchair)?: None Help needed standing up from a chair using your  arms (e.g., wheelchair or bedside chair)?: None Help needed to walk in hospital room?: None Help needed climbing 3-5 steps with a railing? : None 6 Click Score: 24    End of Session Equipment Utilized During Treatment: Gait belt Activity Tolerance: Patient tolerated treatment well Patient left: in chair;with call bell/phone within reach Nurse Communication: Mobility status PT Visit Diagnosis: Other abnormalities of gait and mobility (R26.89)     Time: 1027-2536 PT Time Calculation (min) (ACUTE ONLY): 17 min  Charges:  $Gait Training: 8-22 mins                     Marisa Severin, PT, DPT Acute Rehabilitation Services Pager [phone] Office [phone]      Marguarite Arbour A Sabra Heck 11/10/2020, 8:33 AM

## 2020-11-10 NOTE — Care Management Important Message (Signed)
Important Message  Patient Details  Name: Phillip [name] MRN: [id number] Date of Birth: [birth year]   Medicare Important Message Given:  Yes - Important Message mailed due to current National Emergency   Verbal consent obtained due to current National Emergency  Relationship to patient: Self Contact Name: Kenyun Call Date: 11/10/20  Time: 0945 Phone: [phone] Outcome: Spoke with contact Important Message mailed to: Patient address on file   Delorse Lek 11/10/2020, 9:45 AM

## 2020-11-12 LAB — CULTURE, BLOOD (ROUTINE X 2)
Culture: NO GROWTH
Culture: NO GROWTH
Special Requests: ADEQUATE

## 2020-11-20 ENCOUNTER — Encounter: Payer: Self-pay | Admitting: Cardiology

## 2020-11-20 ENCOUNTER — Ambulatory Visit: Payer: PPO | Admitting: Cardiology

## 2020-11-20 ENCOUNTER — Other Ambulatory Visit: Payer: Self-pay

## 2020-11-20 VITALS — BP 148/72 | HR 68 | Ht 72.0 in | Wt 204.8 lb

## 2020-11-20 DIAGNOSIS — I10 Essential (primary) hypertension: Secondary | ICD-10-CM

## 2020-11-20 DIAGNOSIS — N183 Chronic kidney disease, stage 3 unspecified: Secondary | ICD-10-CM

## 2020-11-20 DIAGNOSIS — I350 Nonrheumatic aortic (valve) stenosis: Secondary | ICD-10-CM | POA: Diagnosis not present

## 2020-11-20 DIAGNOSIS — Z794 Long term (current) use of insulin: Secondary | ICD-10-CM

## 2020-11-20 DIAGNOSIS — E1122 Type 2 diabetes mellitus with diabetic chronic kidney disease: Secondary | ICD-10-CM

## 2020-11-20 DIAGNOSIS — N184 Chronic kidney disease, stage 4 (severe): Secondary | ICD-10-CM

## 2020-11-20 DIAGNOSIS — E782 Mixed hyperlipidemia: Secondary | ICD-10-CM

## 2020-11-20 DIAGNOSIS — I2510 Atherosclerotic heart disease of native coronary artery without angina pectoris: Secondary | ICD-10-CM

## 2020-11-20 MED ORDER — NITROGLYCERIN 0.4 MG SL SUBL: 0.4000 mg | SUBLINGUAL_TABLET | SUBLINGUAL | 6 refills | Status: DC | PRN

## 2020-11-20 MED ORDER — CLONIDINE HCL 0.3 MG PO TABS: 0.3000 mg | ORAL_TABLET | Freq: Every day | ORAL | 3 refills | Status: DC

## 2020-11-20 MED ORDER — CLOPIDOGREL BISULFATE 75 MG PO TABS: 75.0000 mg | ORAL_TABLET | Freq: Every day | ORAL | 11 refills | Status: DC

## 2020-11-20 MED ORDER — CLOPIDOGREL BISULFATE 75 MG PO TABS: 75.0000 mg | ORAL_TABLET | Freq: Every day | ORAL | 3 refills | Status: DC

## 2020-11-20 MED ORDER — LOVASTATIN 20 MG PO TABS: 20.0000 mg | ORAL_TABLET | Freq: Every day | ORAL | 3 refills | Status: DC

## 2020-11-20 MED ORDER — CARVEDILOL 6.25 MG PO TABS: 6.2500 mg | ORAL_TABLET | Freq: Two times a day (BID) | ORAL | 3 refills | Status: DC

## 2020-11-20 MED ORDER — ISOSORBIDE MONONITRATE ER 120 MG PO TB24: 120.0000 mg | ORAL_TABLET | Freq: Every day | ORAL | 3 refills | Status: DC

## 2020-11-20 NOTE — Patient Instructions (Signed)
Medication Instructions:  No medication changes. *If you need a refill on your cardiac medications before your next appointment, please call your pharmacy*   Lab Work: Your physician recommends that you have a BMET today in the office.  If you have labs (blood work) drawn today and your tests are completely normal, you will receive your results only by: [place] (if you have MyChart) OR A paper copy in the mail If you have any lab test that is abnormal or we need to change your treatment, we will call you to review the results.   Testing/Procedures: None ordered   Follow-Up: At [facility], you and your health needs are our priority.  As part of our continuing mission to provide you with exceptional heart care, we have created designated Provider Care Teams.  These Care Teams include your primary Cardiologist (physician) and Advanced Practice Providers (APPs -  Physician Assistants and Nurse Practitioners) who all work together to provide you with the care you need, when you need it.  We recommend signing up for the patient portal called "MyChart".  Sign up information is provided on this After Visit Summary.  MyChart is used to connect with patients for Virtual Visits (Telemedicine).  Patients are able to view lab/test results, encounter notes, upcoming appointments, etc.  Non-urgent messages can be sent to your provider as well.   To learn more about what you can do with MyChart, go to [email or web address].    Your next appointment:   3 month(s)  The format for your next appointment:   In Person  Provider:   Jyl Heinz, MD   Other Instructions NA

## 2020-11-20 NOTE — Progress Notes (Signed)
Cardiology Office Note:    Date:  11/20/2020   ID:  Phillip [name], DOB [birth year], MRN [id number]  PCP:  Raina Mina., MD  Cardiologist:  Jenean Lindau, MD   Referring MD: Raina Mina., MD    ASSESSMENT:    1. Coronary artery disease involving native coronary artery of native heart without angina pectoris   2. Mixed hyperlipidemia   3. Type 2 diabetes mellitus with stage 3 chronic kidney disease, with long-term current use of insulin, unspecified whether stage 3a or 3b CKD ([place])   4. CKD (chronic kidney disease) stage 4, GFR 15-29 ml/min (HCC)   5. Benign essential hypertension   6. Aortic stenosis, moderate    PLAN:    In order of problems listed above:  Secondary prevention stressed with the patient.  Importance of compliance with diet medication stressed any vocalized understanding. Advanced cardiomyopathy: Medical management at this time.  Patient is on appropriate medications considering the patient's multiple comorbidities including advanced renal insufficiency.  He also is a poor historian.  I told him to weigh himself on a regular basis and diet was emphasized.  Salt intake issues were discussed. Essential hypertension: Blood pressure stable and diet was emphasized. Mixed dyslipidemia: On statin therapy and managed by primary care. I told him to get an appointment with primary care in the next few days for assessment of his hospitalization and post hospitalization visit.  He has an appointment with his nephrologist coming up in the next few days.  For this reason I will do a Chem-7 and troponin today. Patient will be seen in follow-up appointment in 3 months or earlier if the patient has any concerns    Medication Adjustments/Labs and Tests Ordered: Current medicines are reviewed at length with the patient today.  Concerns regarding medicines are outlined above.  No orders of the defined types were placed in this encounter.  No orders of the defined types  were placed in this encounter.    No chief complaint on file.    History of Present Illness:    Phillip [name] is a 65 y.o. male.  Patient has history of moderate to mild aortic stenosis, advanced cardiomyopathy, essential hypertension and advanced renal insufficiency.  He was admitted to the hospital with encephalopathy.  Subsequently he has been fine.  He was discharged from the hospital a few days ago.  He denies any chest pain orthopnea or PND.  He is a poor historian.  I told him to weigh himself on a regular basis.  At the time of my evaluation, the patient is alert awake oriented and in no distress.  He denies any problems from a cardiovascular standpoint.  Past Medical History:  Diagnosis Date   Abdominopelvic abscess ([place]) 09/29/2018   Abscess of appendix 09/22/2018   Acquired spondylolisthesis of lumbosacral region 03/20/2020   Acute kidney injury ([place]) 10/07/2018   Acute kidney injury superimposed on CKD ([place]) 02/18/2020   AKI (acute kidney injury) ([place]) 11/06/2020   Anemia of chronic disease 08/18/2019   Anemia, chronic disease 08/18/2019   Aortic stenosis, moderate 10/15/2019   Appendicitis 09/08/2018   ARF (acute renal failure) ([place]) 02/19/2020   Asthma    Atherosclerosis of native arteries of the extremities with ulceration ([place]) 02/24/2018   Formatting of this note might be different from the original. Last Assessment & Plan:  His ABIs today are 1.07 on the right and 0.99 on the left with biphasic waveforms.  Although these pressures may be  somewhat elevated from medial calcification, his flow does appear to be reasonably good.  His left ABI was 0.58 prior to intervention. At this point, we will stretch out his follow-up and see him b   Benign hypertension with CKD (chronic kidney disease) stage IV ([place]) 02/24/2018   Last Assessment & Plan:  blood pressure control important in reducing the progression of atherosclerotic disease. On appropriate oral medications.   Benign prostatic  hyperplasia without lower urinary tract symptoms 01/14/2018   Bilateral lower extremity edema 11/04/2018   Bradycardia 03/25/2018   Bruit of right carotid artery 07/26/2015   CAD (coronary artery disease) 07/26/2015   Formatting of this note might be different from the original. [name]  Formatting of this note might be different from the original. [name]   Cardiac murmur 07/26/2015   CHF (congestive heart failure) (HCC)    Chronic combined systolic and diastolic CHF (congestive heart failure) ([place]) 07/26/2015   Formatting of this note might be different from the original. [place] EF 35%   Chronic idiopathic constipation 11/02/2018   Chronic low back pain without sciatica 12/02/2018   Chronic pain of right hip [id number]   Chronic systolic heart failure ([place]) 07/26/2015   Formatting of this note might be different from the original. [place] EF 35%   CKD (chronic kidney disease) stage 4, GFR 15-29 ml/min (HCC) 10/07/2018   Congestive heart failure ([place]) 07/26/2015   Last Assessment & Plan:  Poor cardiac function could certainly be contributing to poor blood supply to the feet and toes as well.  Formatting of this note might be different from the original. Formatting of this note might be different from the original. Last Assessment & Plan:  Poor cardiac function could certainly be contributing to poor blood supply to the feet and toes as well.  Last Assessmen   Coronary artery disease    Diabetes mellitus without complication ([place])    Dysphagia 07/26/2015   Encounter for long-term (current) use of insulin ([place]) 09/27/2020   Erectile dysfunction 07/14/2017   Fever 11/07/2020   Foot ulcer ([place])    left foot   GERD (gastroesophageal reflux disease)    Heart murmur    Hematuria 07/27/2015   High risk medication use 07/26/2015   Hyperkalemia 05/29/2020   Hyperlipidemia 07/26/2015   Hyperlipidemia associated with type 2 diabetes mellitus ([place]) 02/18/2020   Hypertension    Hypertension associated with  diabetes ([place])    Hypoglycemia 05/07/2019   Hypothyroidism 11/14/2015   Hypothyroidism (acquired) 11/14/2015   Insomnia 11/02/2018   Kidney insufficiency    Lumbar spondylosis 03/20/2020   Malaise and fatigue 03/25/2018   Mild aortic stenosis 05/12/2019   Mitral regurgitation 05/12/2019   Moderate aortic regurgitation 05/12/2019   Non-compliance 05/05/2019   NSTEMI (non-ST elevated myocardial infarction) ([place]) 09/27/2020   Osseous and subluxation stenosis of intervertebral foramina of lumbar region 03/20/2020   Peripheral vascular disease ([place])    Personal history of tobacco use, presenting hazards to health 09/27/2020   Polyneuropathy in diabetes ([place]) 05/07/2019   Primary osteoarthritis involving multiple joints 10/06/2019   Primary osteoarthritis of right hip 02/21/2020   Puncture wound of right hip 04/02/2018   PVD (peripheral vascular disease) with claudication ([place]) 07/26/2015   Angioplasty 02/2018   Regional wall motion abnormality of heart 05/07/2019   Right groin pain 02/18/2020   Screening for colon cancer 02/18/2018   Sleep apnea 07/26/2015   Subacute osteomyelitis of left foot ([place]) 03/25/2018   Type 2 diabetes mellitus with stage 3  chronic kidney disease, with long-term current use of insulin ([place]) 07/26/2015   Type 2 diabetes mellitus with stage 4 chronic kidney disease, with long-term current use of insulin ([place]) 07/26/2015   Type II diabetes mellitus with neurological manifestations ([place]) 02/18/2020   Vitamin B12 deficiency 06/16/2019   Vitamin D deficiency 05/07/2018    Past Surgical History:  Procedure Laterality Date   AMPUTATION TOE Left 03/13/2018   Procedure: AMPUTATION TOE-MPJ;  Surgeon: Samara Deist, DPM;  Location: [facility] ORS;  Service: Podiatry;  Laterality: Left;   ANGIOPLASTY     CARDIAC CATHETERIZATION     CARDIAC SURGERY     heart bypass (4)   CORONARY ARTERY BYPASS GRAFT     LAPAROSCOPIC APPENDECTOMY N/A 09/08/2018   Procedure: APPENDECTOMY LAPAROSCOPIC;  Surgeon:  Jules Husbands, MD;  Location: [facility] ORS;  Service: General;  Laterality: N/A;   LOWER EXTREMITY ANGIOGRAPHY Left 03/02/2018   Procedure: LOWER EXTREMITY ANGIOGRAPHY;  Surgeon: Algernon Huxley, MD;  Location: [facility];  Service: Cardiovascular;  Laterality: Left;   ROTATOR CUFF REPAIR Left     Current Medications: Current Meds  Medication Sig   acetaminophen (TYLENOL) 500 MG tablet Take 1,000 mg by mouth every 6 (six) hours as needed for moderate pain or headache.    aspirin 81 MG tablet Take 81 mg by mouth daily.   bumetanide (BUMEX) 2 MG tablet Take 1 tablet (2 mg total) by mouth 2 (two) times daily.   carvedilol (COREG) 6.25 MG tablet Take 6.25 mg by mouth 2 (two) times daily with a meal.   cloNIDine (CATAPRES) 0.3 MG tablet Take 0.3 mg by mouth at bedtime.   clopidogrel (PLAVIX) 75 MG tablet Take 1 tablet (75 mg total) by mouth daily.   gabapentin (NEURONTIN) 600 MG tablet Take 300 mg by mouth 2 (two) times daily.   insulin detemir (LEVEMIR) 100 unit/ml SOLN Inject 10-20 Units into the skin at bedtime as needed for high blood sugar.   isosorbide mononitrate (IMDUR) 120 MG 24 hr tablet Take 120 mg by mouth daily.   levothyroxine (SYNTHROID) 25 MCG tablet Take 25 mcg by mouth daily.    losartan (COZAAR) 25 MG tablet Take 1 tablet (25 mg total) by mouth daily.   lovastatin (MEVACOR) 40 MG tablet Take 20 mg by mouth at bedtime.   nitroGLYCERIN (NITROSTAT) 0.4 MG SL tablet Place 0.4 mg under the tongue every 5 (five) minutes as needed for chest pain.    tamsulosin (FLOMAX) 0.4 MG CAPS capsule Take 0.4 mg by mouth daily.   vitamin B-12 (CYANOCOBALAMIN) 500 MCG tablet Take 500 mcg by mouth daily.      Allergies:   Patient has no known allergies.   Social History   Socioeconomic History   Marital status: Married    Spouse name: Not on file   Number of children: Not on file   Years of education: Not on file   Highest education level: Not on file  Occupational History   Not on  file  Tobacco Use   Smoking status: Former    Pack years: 0.00    Types: Cigarettes    Quit date: 2003    Years since quitting: 19.4   Smokeless tobacco: Never  Vaping Use   Vaping Use: Never used  Substance and Sexual Activity   Alcohol use: Not Currently    Comment: 2003   Drug use: Not Currently    Types: Marijuana   Sexual activity: Not on file  Other Topics  Concern   Not on file  Social History Narrative   Not on file   Social Determinants of Health   Financial Resource Strain: Not on file  Food Insecurity: Not on file  Transportation Needs: Not on file  Physical Activity: Not on file  Stress: Not on file  Social Connections: Not on file     Family History: The patient's family history includes Diabetes in his mother; Heart attack in his brother; Heart disease in his brother; Hyperlipidemia in his mother; Hypertension in his mother; Lung disease in his father.  ROS:   Please see the history of present illness.    All other systems reviewed and are negative.  EKGs/Labs/Other Studies Reviewed:    The following studies were reviewed today: I discussed my findings with the patient at length   Recent Labs: 02/19/2020: B Natriuretic Peptide 406.2 11/07/2020: ALT 11 11/09/2020: Magnesium 2.2 11/10/2020: BUN 73; Creatinine, Ser 3.14; Hemoglobin 10.1; Platelets 113; Potassium 3.6; Sodium 138  Recent Lipid Panel    Component Value Date/Time   CHOL 77 (L) 08/10/2019 1043   TRIG 65 08/10/2019 1043   HDL 25 (L) 08/10/2019 1043   CHOLHDL 3.1 08/10/2019 1043   LDLCALC 37 08/10/2019 1043    Physical Exam:    VS:  BP (!) 148/72   Pulse 68   Ht 6' (1.829 m)   Wt 204 lb 12.8 oz (92.9 kg)   SpO2 98%   BMI 27.78 kg/m     Wt Readings from Last 3 Encounters:  11/20/20 204 lb 12.8 oz (92.9 kg)  11/10/20 213 lb 10 oz (96.9 kg)  06/07/20 211 lb (95.7 kg)     GEN: Patient is in no acute distress HEENT: Normal NECK: No JVD; No carotid bruits LYMPHATICS: No  lymphadenopathy CARDIAC: Hear sounds regular, 2/6 systolic murmur at the apex. RESPIRATORY:  Clear to auscultation without rales, wheezing or rhonchi  ABDOMEN: Soft, non-tender, non-distended MUSCULOSKELETAL:  No edema; No deformity  SKIN: Warm and dry NEUROLOGIC:  Alert and oriented x 3 PSYCHIATRIC:  Normal affect   Signed, Jenean Lindau, MD  11/20/2020 1:35 PM    [facility]

## 2020-11-21 LAB — BASIC METABOLIC PANEL
BUN/Creatinine Ratio: 20 (ref 10–24)
BUN: 50 mg/dL — ABNORMAL HIGH (ref 8–27)
CO2: 24 mmol/L (ref 20–29)
Calcium: 8.2 mg/dL — ABNORMAL LOW (ref 8.6–10.2)
Chloride: 102 mmol/L (ref 96–106)
Creatinine, Ser: 2.52 mg/dL — ABNORMAL HIGH (ref 0.76–1.27)
Glucose: 214 mg/dL — ABNORMAL HIGH (ref 65–99)
Potassium: 4.4 mmol/L (ref 3.5–5.2)
Sodium: 139 mmol/L (ref 134–144)
eGFR: 28 mL/min/{1.73_m2} — ABNORMAL LOW (ref >59)

## 2020-11-22 DIAGNOSIS — I5022 Chronic systolic (congestive) heart failure: Secondary | ICD-10-CM | POA: Diagnosis not present

## 2020-11-22 DIAGNOSIS — E1122 Type 2 diabetes mellitus with diabetic chronic kidney disease: Secondary | ICD-10-CM | POA: Diagnosis not present

## 2020-11-22 DIAGNOSIS — M25512 Pain in left shoulder: Secondary | ICD-10-CM | POA: Diagnosis not present

## 2020-11-22 DIAGNOSIS — G8929 Other chronic pain: Secondary | ICD-10-CM | POA: Diagnosis not present

## 2020-11-22 DIAGNOSIS — Z794 Long term (current) use of insulin: Secondary | ICD-10-CM | POA: Diagnosis not present

## 2020-11-22 DIAGNOSIS — M542 Cervicalgia: Secondary | ICD-10-CM | POA: Diagnosis not present

## 2020-11-22 DIAGNOSIS — N184 Chronic kidney disease, stage 4 (severe): Secondary | ICD-10-CM | POA: Diagnosis not present

## 2020-11-28 DIAGNOSIS — N179 Acute kidney failure, unspecified: Secondary | ICD-10-CM | POA: Diagnosis not present

## 2020-11-28 DIAGNOSIS — Z794 Long term (current) use of insulin: Secondary | ICD-10-CM | POA: Diagnosis not present

## 2020-11-28 DIAGNOSIS — N184 Chronic kidney disease, stage 4 (severe): Secondary | ICD-10-CM | POA: Diagnosis not present

## 2020-11-28 DIAGNOSIS — I129 Hypertensive chronic kidney disease with stage 1 through stage 4 chronic kidney disease, or unspecified chronic kidney disease: Secondary | ICD-10-CM | POA: Diagnosis not present

## 2020-11-28 DIAGNOSIS — E1122 Type 2 diabetes mellitus with diabetic chronic kidney disease: Secondary | ICD-10-CM | POA: Diagnosis not present

## 2021-03-05 ENCOUNTER — Encounter: Payer: Self-pay | Admitting: Cardiology

## 2021-03-05 ENCOUNTER — Other Ambulatory Visit: Payer: Self-pay

## 2021-03-05 ENCOUNTER — Ambulatory Visit: Payer: PPO | Admitting: Cardiology

## 2021-03-05 VITALS — BP 142/70 | HR 66 | Ht 72.0 in | Wt 209.2 lb

## 2021-03-05 DIAGNOSIS — E782 Mixed hyperlipidemia: Secondary | ICD-10-CM

## 2021-03-05 DIAGNOSIS — N184 Chronic kidney disease, stage 4 (severe): Secondary | ICD-10-CM | POA: Diagnosis not present

## 2021-03-05 DIAGNOSIS — I350 Nonrheumatic aortic (valve) stenosis: Secondary | ICD-10-CM

## 2021-03-05 DIAGNOSIS — I2510 Atherosclerotic heart disease of native coronary artery without angina pectoris: Secondary | ICD-10-CM | POA: Diagnosis not present

## 2021-03-05 NOTE — Patient Instructions (Signed)
Medication Instructions:  Your physician has recommended you make the following change in your medication:   Stop Asprin  *If you need a refill on your cardiac medications before your next appointment, please call your pharmacy*   Lab Work: None ordered If you have labs (blood work) drawn today and your tests are completely normal, you will receive your results only by: [place] (if you have MyChart) OR A paper copy in the mail If you have any lab test that is abnormal or we need to change your treatment, we will call you to review the results.   Testing/Procedures: Your physician has requested that you have an echocardiogram. Echocardiography is a painless test that uses sound waves to create images of your heart. It provides your doctor with information about the size and shape of your heart and how well your heart's chambers and valves are working. This procedure takes approximately one hour. There are no restrictions for this procedure.    Follow-Up: At [facility], you and your health needs are our priority.  As part of our continuing mission to provide you with exceptional heart care, we have created designated Provider Care Teams.  These Care Teams include your primary Cardiologist (physician) and Advanced Practice Providers (APPs -  Physician Assistants and Nurse Practitioners) who all work together to provide you with the care you need, when you need it.  We recommend signing up for the patient portal called "MyChart".  Sign up information is provided on this After Visit Summary.  MyChart is used to connect with patients for Virtual Visits (Telemedicine).  Patients are able to view lab/test results, encounter notes, upcoming appointments, etc.  Non-urgent messages can be sent to your provider as well.   To learn more about what you can do with MyChart, go to [email or web address].    Your next appointment:   6 month(s)  The format for your next appointment:   In  Person  Provider:   Jyl Heinz, MD   Other Instructions Echocardiogram An echocardiogram is a test that uses sound waves (ultrasound) to produce images of the heart. Images from an echocardiogram can provide important information about: Heart size and shape. The size and thickness and movement of your heart's walls. Heart muscle function and strength. Heart valve function or if you have stenosis. Stenosis is when the heart valves are too narrow. If blood is flowing backward through the heart valves (regurgitation). A tumor or infectious growth around the heart valves. Areas of heart muscle that are not working well because of poor blood flow or injury from a heart attack. Aneurysm detection. An aneurysm is a weak or damaged part of an artery wall. The wall bulges out from the normal force of blood pumping through the body. Tell a health care provider about: Any allergies you have. All medicines you are taking, including vitamins, herbs, eye drops, creams, and over-the-counter medicines. Any blood disorders you have. Any surgeries you have had. Any medical conditions you have. Whether you are pregnant or may be pregnant. What are the risks? Generally, this is a safe test. However, problems may occur, including an allergic reaction to dye (contrast) that may be used during the test. What happens before the test? No specific preparation is needed. You may eat and drink normally. What happens during the test? You will take off your clothes from the waist up and put on a hospital gown. Electrodes or electrocardiogram (ECG)patches may be placed on your chest. The electrodes or patches are  then connected to a device that monitors your heart rate and rhythm. You will lie down on a table for an ultrasound exam. A gel will be applied to your chest to help sound waves pass through your skin. A handheld device, called a transducer, will be pressed against your chest and moved over your  heart. The transducer produces sound waves that travel to your heart and bounce back (or "echo" back) to the transducer. These sound waves will be captured in real-time and changed into images of your heart that can be viewed on a video monitor. The images will be recorded on a computer and reviewed by your health care provider. You may be asked to change positions or hold your breath for a short time. This makes it easier to get different views or better views of your heart. In some cases, you may receive contrast through an IV in one of your veins. This can improve the quality of the pictures from your heart. The procedure may vary among health care providers and hospitals.   What can I expect after the test? You may return to your normal, everyday life, including diet, activities, and medicines, unless your health care provider tells you not to do that. Follow these instructions at home: It is up to you to get the results of your test. Ask your health care provider, or the department that is doing the test, when your results will be ready. Keep all follow-up visits. This is important. Summary An echocardiogram is a test that uses sound waves (ultrasound) to produce images of the heart. Images from an echocardiogram can provide important information about the size and shape of your heart, heart muscle function, heart valve function, and other possible heart problems. You do not need to do anything to prepare before this test. You may eat and drink normally. After the echocardiogram is completed, you may return to your normal, everyday life, unless your health care provider tells you not to do that. This information is not intended to replace advice given to you by your health care provider. Make sure you discuss any questions you have with your health care provider. Document Revised: 01/11/2020 Document Reviewed: 01/11/2020 Elsevier Patient Education  2021 [employer].

## 2021-03-05 NOTE — Progress Notes (Signed)
Cardiology Office Note:    Date:  03/05/2021   ID:  Phillip [name], DOB May 03, 1956, MRN [id number]  PCP:  Raina Mina., MD  Cardiologist:  Jenean Lindau, MD   Referring MD: Raina Mina., MD    ASSESSMENT:    1. Coronary artery disease involving native coronary artery of native heart without angina pectoris   2. Mixed hyperlipidemia   3. Mild aortic stenosis   4. CKD (chronic kidney disease) stage 4, GFR 15-29 ml/min (HCC)    PLAN:    In order of problems listed above:  Coronary artery disease: Secondary prevention stressed with the patient.  Importance of compliance with diet medication stressed any vocalized understanding. Essential hypertension: Stable.  Blood pressure issues were discussed lifestyle modification was urged. Dyslipidemia diabetes mellitus and renal insufficiency: I discussed these findings with the patient at length.  On lab evaluation Insufficiency was present at the last time.  This is followed with the nephrologist. Also I advised him to take only 1 antiplatelet agent Plavix.  We will stop aspirin.  He understands.  Lipids were reviewed. Patient will be seen in follow-up appointment in 6 months or earlier if the patient has any concerns    Medication Adjustments/Labs and Tests Ordered: Current medicines are reviewed at length with the patient today.  Concerns regarding medicines are outlined above.  No orders of the defined types were placed in this encounter.  No orders of the defined types were placed in this encounter.    No chief complaint on file.    History of Present Illness:    Phillip [name] is a 65 y.o. male.  Patient has past medical history of coronary artery disease, essential hypertension, dyslipidemia, advanced renal insufficiency.  He denies any problems at this time and takes care of activities of daily living.  No chest pain orthopnea or PND.  At the time of my evaluation, the patient is alert awake oriented and in no  distress.  Past Medical History:  Diagnosis Date   Abdominopelvic abscess ([place]) 09/29/2018   Abscess of appendix 09/22/2018   Acquired spondylolisthesis of lumbosacral region 03/20/2020   Acute kidney injury ([place]) 10/07/2018   Acute kidney injury superimposed on CKD ([place]) 02/18/2020   AKI (acute kidney injury) ([place]) 11/06/2020   Anemia of chronic disease 08/18/2019   Anemia, chronic disease 08/18/2019   Aortic stenosis, moderate 10/15/2019   Appendicitis 09/08/2018   ARF (acute renal failure) ([place]) 02/19/2020   Asthma    Atherosclerosis of native arteries of the extremities with ulceration ([place]) 02/24/2018   Formatting of this note might be different from the original. Last Assessment & Plan:  His ABIs today are 1.07 on the right and 0.99 on the left with biphasic waveforms.  Although these pressures may be somewhat elevated from medial calcification, his flow does appear to be reasonably good.  His left ABI was 0.58 prior to intervention. At this point, we will stretch out his follow-up and see him b   Benign hypertension with CKD (chronic kidney disease) stage IV ([place]) 02/24/2018   Last Assessment & Plan:  blood pressure control important in reducing the progression of atherosclerotic disease. On appropriate oral medications.   Benign prostatic hyperplasia without lower urinary tract symptoms 01/14/2018   Bilateral lower extremity edema 11/04/2018   Bradycardia 03/25/2018   Bruit of right carotid artery 07/26/2015   CAD (coronary artery disease) 07/26/2015   Formatting of this note might be different from the original. [name]  Formatting of this note might be different from the original. [name]   Cardiac murmur 07/26/2015   Chronic combined systolic and diastolic CHF (congestive heart failure) ([place]) 07/26/2015   Formatting of this note might be different from the original. [place] EF 35%   Chronic idiopathic constipation 11/02/2018   Chronic low back pain without sciatica  12/02/2018   Chronic pain of right hip [id number]   Chronic systolic heart failure ([place]) 07/26/2015   Formatting of this note might be different from the original. [place] EF 35%   CKD (chronic kidney disease) stage 4, GFR 15-29 ml/min (HCC) 10/07/2018   Congestive heart failure ([place]) 07/26/2015   Last Assessment & Plan:  Poor cardiac function could certainly be contributing to poor blood supply to the feet and toes as well.  Formatting of this note might be different from the original. Formatting of this note might be different from the original. Last Assessment & Plan:  Poor cardiac function could certainly be contributing to poor blood supply to the feet and toes as well.  Last Assessmen   Coronary artery disease    Diabetes mellitus without complication ([place])    Dysphagia 07/26/2015   Encounter for long-term (current) use of insulin ([place]) 09/27/2020   Erectile dysfunction 07/14/2017   Fever 11/07/2020   Foot ulcer ([place])    left foot   GERD (gastroesophageal reflux disease)    Heart murmur    Hematuria 07/27/2015   High risk medication use 07/26/2015   Hyperkalemia 05/29/2020   Hyperlipidemia 07/26/2015   Hyperlipidemia associated with type 2 diabetes mellitus ([place]) 02/18/2020   Hypertension    Hypertension associated with diabetes ([place])    Hypoglycemia 05/07/2019   Hypothyroidism 11/14/2015   Hypothyroidism (acquired) 11/14/2015   Insomnia 11/02/2018   Kidney insufficiency    Lumbar spondylosis 03/20/2020   Malaise and fatigue 03/25/2018   Mild aortic stenosis 05/12/2019   Mitral regurgitation 05/12/2019   Moderate aortic regurgitation 05/12/2019   Non-compliance 05/05/2019   NSTEMI (non-ST elevated myocardial infarction) ([place]) 09/27/2020   Osseous and subluxation stenosis of intervertebral foramina of lumbar region 03/20/2020   Peripheral vascular disease ([place])    Personal history of tobacco use, presenting hazards to health 09/27/2020   Polyneuropathy in diabetes ([place])  05/07/2019   Primary osteoarthritis involving multiple joints 10/06/2019   Primary osteoarthritis of right hip 02/21/2020   Puncture wound of right hip 04/02/2018   PVD (peripheral vascular disease) with claudication ([place]) 07/26/2015   Angioplasty 02/2018   Regional wall motion abnormality of heart 05/07/2019   Right groin pain 02/18/2020   Screening for colon cancer 02/18/2018   Sleep apnea 07/26/2015   Subacute osteomyelitis of left foot ([place]) 03/25/2018   Type 2 diabetes mellitus with stage 3 chronic kidney disease, with long-term current use of insulin ([place]) 07/26/2015   Type 2 diabetes mellitus with stage 4 chronic kidney disease, with long-term current use of insulin ([place]) 07/26/2015   Type II diabetes mellitus with neurological manifestations ([place]) 02/18/2020   Vitamin B12 deficiency 06/16/2019   Vitamin D deficiency 05/07/2018    Past Surgical History:  Procedure Laterality Date   AMPUTATION TOE Left 03/13/2018   Procedure: AMPUTATION TOE-MPJ;  Surgeon: Samara Deist, DPM;  Location: [facility];  Service: Podiatry;  Laterality: Left;   ANGIOPLASTY     CARDIAC CATHETERIZATION     CARDIAC SURGERY     heart bypass (4)   CORONARY ARTERY BYPASS GRAFT     LAPAROSCOPIC APPENDECTOMY N/A 09/08/2018   Procedure:  APPENDECTOMY LAPAROSCOPIC;  Surgeon: Jules Husbands, MD;  Location: [facility];  Service: General;  Laterality: N/A;   LOWER EXTREMITY ANGIOGRAPHY Left 03/02/2018   Procedure: LOWER EXTREMITY ANGIOGRAPHY;  Surgeon: Algernon Huxley, MD;  Location: [facility];  Service: Cardiovascular;  Laterality: Left;   ROTATOR CUFF REPAIR Left     Current Medications: No outpatient medications have been marked as taking for the 03/05/21 encounter (Office Visit) with Aubrynn Katona, Reita Cliche, MD.     Allergies:   Patient has no known allergies.   Social History   Socioeconomic History   Marital status: Married    Spouse name: Not on file   Number of children: Not on file   Years of  education: Not on file   Highest education level: Not on file  Occupational History   Not on file  Tobacco Use   Smoking status: Former    Types: Cigarettes    Quit date: 2003    Years since quitting: 19.7   Smokeless tobacco: Never  Vaping Use   Vaping Use: Never used  Substance and Sexual Activity   Alcohol use: Not Currently    Comment: 2003   Drug use: Not Currently    Types: Marijuana   Sexual activity: Not on file  Other Topics Concern   Not on file  Social History Narrative   Not on file   Social Determinants of Health   Financial Resource Strain: Not on file  Food Insecurity: Not on file  Transportation Needs: Not on file  Physical Activity: Not on file  Stress: Not on file  Social Connections: Not on file     Family History: The patient's family history includes Diabetes in his mother; Heart attack in his brother; Heart disease in his brother; Hyperlipidemia in his mother; Hypertension in his mother; Lung disease in his father.  ROS:   Please see the history of present illness.    All other systems reviewed and are negative.  EKGs/Labs/Other Studies Reviewed:    The following studies were reviewed today: I discussed my findings with the patient at length.   Recent Labs: 11/07/2020: ALT 11 11/09/2020: Magnesium 2.2 11/10/2020: Hemoglobin 10.1; Platelets 113 11/20/2020: BUN 50; Creatinine, Ser 2.52; Potassium 4.4; Sodium 139  Recent Lipid Panel    Component Value Date/Time   CHOL 77 (L) 08/10/2019 1043   TRIG 65 08/10/2019 1043   HDL 25 (L) 08/10/2019 1043   CHOLHDL 3.1 08/10/2019 1043   LDLCALC 37 08/10/2019 1043    Physical Exam:    VS:  BP (!) 142/70   Pulse 66   Ht 6' (1.829 m)   Wt 209 lb 3.2 oz (94.9 kg)   SpO2 98%   BMI 28.37 kg/m     Wt Readings from Last 3 Encounters:  03/05/21 209 lb 3.2 oz (94.9 kg)  11/20/20 204 lb 12.8 oz (92.9 kg)  11/10/20 213 lb 10 oz (96.9 kg)     GEN: Patient is in no acute distress HEENT: Normal NECK:  No JVD; No carotid bruits LYMPHATICS: No lymphadenopathy CARDIAC: Hear sounds regular, 2/6 systolic murmur at the apex. RESPIRATORY:  Clear to auscultation without rales, wheezing or rhonchi  ABDOMEN: Soft, non-tender, non-distended MUSCULOSKELETAL:  No edema; No deformity  SKIN: Warm and dry NEUROLOGIC:  Alert and oriented x 3 PSYCHIATRIC:  Normal affect   Signed, Jenean Lindau, MD  03/05/2021 9:37 AM    [place]

## 2021-03-20 ENCOUNTER — Encounter: Payer: Self-pay | Admitting: General Surgery

## 2021-03-21 ENCOUNTER — Other Ambulatory Visit: Payer: Self-pay

## 2021-03-21 ENCOUNTER — Ambulatory Visit (INDEPENDENT_AMBULATORY_CARE_PROVIDER_SITE_OTHER): Payer: PPO

## 2021-03-21 DIAGNOSIS — I350 Nonrheumatic aortic (valve) stenosis: Secondary | ICD-10-CM | POA: Diagnosis not present

## 2021-03-21 LAB — ECHOCARDIOGRAM COMPLETE
AR max vel: 0.88 cm2
AV Area VTI: 1.04 cm2
AV Area mean vel: 0.81 cm2
AV Mean grad: 14.3 mmHg
AV Peak grad: 23.7 mmHg
Ao pk vel: 2.43 m/s
Area-P 1/2: 3.54 cm2
Calc EF: 38.9 %
P 1/2 time: 436 msec
S' Lateral: 4.9 cm
Single Plane A2C EF: 37 %
Single Plane A4C EF: 44.8 %

## 2021-03-27 ENCOUNTER — Telehealth: Payer: Self-pay

## 2021-03-27 NOTE — Telephone Encounter (Signed)
Spoke with patient regarding results and recommendation.  Patient verbalizes understanding and is agreeable to plan of care. Advised patient to call back with any issues or concerns.  

## 2021-03-27 NOTE — Telephone Encounter (Signed)
-----   Message from Jenean Lindau, MD sent at 03/27/2021 12:02 AM EDT ----- Moderate Aortic stenosis. Moderately depressed EF. Please bring him in to discuss report in next couple of weeks. Cc pcp Jenean Lindau, MD 03/27/2021 12:01 AM

## 2021-04-10 ENCOUNTER — Other Ambulatory Visit: Payer: Self-pay

## 2021-04-10 ENCOUNTER — Encounter: Payer: Self-pay | Admitting: Cardiology

## 2021-04-10 ENCOUNTER — Ambulatory Visit: Payer: PPO | Admitting: Cardiology

## 2021-04-10 VITALS — BP 158/80 | HR 78 | Ht 72.0 in | Wt 207.8 lb

## 2021-04-10 DIAGNOSIS — I350 Nonrheumatic aortic (valve) stenosis: Secondary | ICD-10-CM | POA: Diagnosis not present

## 2021-04-10 DIAGNOSIS — I2510 Atherosclerotic heart disease of native coronary artery without angina pectoris: Secondary | ICD-10-CM | POA: Diagnosis not present

## 2021-04-10 DIAGNOSIS — Z794 Long term (current) use of insulin: Secondary | ICD-10-CM

## 2021-04-10 DIAGNOSIS — E1122 Type 2 diabetes mellitus with diabetic chronic kidney disease: Secondary | ICD-10-CM

## 2021-04-10 DIAGNOSIS — I351 Nonrheumatic aortic (valve) insufficiency: Secondary | ICD-10-CM

## 2021-04-10 DIAGNOSIS — N183 Chronic kidney disease, stage 3 unspecified: Secondary | ICD-10-CM | POA: Diagnosis not present

## 2021-04-10 NOTE — Patient Instructions (Signed)
Medication Instructions:  Your physician recommends that you continue on your current medications as directed. Please refer to the Current Medication list given to you today.  *If you need a refill on your cardiac medications before your next appointment, please call your pharmacy*   Lab Work: None ordered If you have labs (blood work) drawn today and your tests are completely normal, you will receive your results only by: MyChart Message (if you have MyChart) OR A paper copy in the mail If you have any lab test that is abnormal or we need to change your treatment, we will call you to review the results.   Testing/Procedures: None ordered   Follow-Up: At [facility], you and your health needs are our priority.  As part of our continuing mission to provide you with exceptional heart care, we have created designated Provider Care Teams.  These Care Teams include your primary Cardiologist (physician) and Advanced Practice Providers (APPs -  Physician Assistants and Nurse Practitioners) who all work together to provide you with the care you need, when you need it.  We recommend signing up for the patient portal called "MyChart".  Sign up information is provided on this After Visit Summary.  MyChart is used to connect with patients for Virtual Visits (Telemedicine).  Patients are able to view lab/test results, encounter notes, upcoming appointments, etc.  Non-urgent messages can be sent to your provider as well.   To learn more about what you can do with MyChart, go to [email or web address].    Your next appointment:   3 month(s)  The format for your next appointment:   In Person  Provider:   Rajan Revankar, MD   Other Instructions NA  

## 2021-04-10 NOTE — Progress Notes (Signed)
Cardiology Office Note:    Date:  04/10/2021   ID:  Phillip [name], DOB [birth year], MRN [id number]  PCP:  Raina Mina., MD  Cardiologist:  Jenean Lindau, MD   Referring MD: Raina Mina., MD    ASSESSMENT:    1. Coronary artery disease involving native coronary artery of native heart without angina pectoris   2. Moderate aortic regurgitation   3. Aortic stenosis, moderate   4. Type 2 diabetes mellitus with stage 3 chronic kidney disease, with long-term current use of insulin, unspecified whether stage 3a or 3b CKD ([place])    PLAN:    In order of problems listed above:  Atherosclerotic vascular disease: Secondary prevention stressed with patient.  Importance of compliance with diet medication stressed any vocalized understanding.  He was advised to walk at least half an hour a day 5 days a week and he promises to do so. Essential hypertension: He is noncompliant with dietary advice.  I cautioned him against this.  Especially his blood pressure is elevated.  He tells me his blood pressure is much better at home he is seeing his primary care doctor tomorrow and will get blood pressure readings for him this week in the evening and take it to him tomorrow morning. Moderate aortic stenosis and regurgitation: Medical management at this time.  Patient denies any chest pain shortness of breath or syncope induced symptoms and symptom profile was discussed with him and educated about this. Mixed dyslipidemia and diabetes mellitus: Managed by primary care.  Patient has significant renal insufficiency which influences management. Patient will be seen in follow-up appointment in 3 months or earlier if the patient has any concerns    Medication Adjustments/Labs and Tests Ordered: Current medicines are reviewed at length with the patient today.  Concerns regarding medicines are outlined above.  No orders of the defined types were placed in this encounter.  No orders of the defined types  were placed in this encounter.    No chief complaint on file.    History of Present Illness:    Phillip [name] is a 65 y.o. male.  Patient has past medical history of coronary artery disease, essential hypertension, dyslipidemia, diabetes mellitus and significant renal insufficiency.  He has now found to be low ejection fraction estimated at 35 to 40% with moderate aortic stenosis and regurgitation.  He denies any other problems.  His wife accompanies him for this visit.  He is well ambulatory without any symptoms.  At the time of my evaluation, the patient is alert awake oriented and in no distress. , Past Medical History:  Diagnosis Date   Abdominopelvic abscess ([place]) 09/29/2018   Abscess of appendix 09/22/2018   Acquired spondylolisthesis of lumbosacral region 03/20/2020   Acute kidney injury ([place]) 10/07/2018   Acute kidney injury superimposed on CKD ([place]) 02/18/2020   AKI (acute kidney injury) ([place]) 11/06/2020   Anemia of chronic disease 08/18/2019   Anemia, chronic disease 08/18/2019   Aortic stenosis, moderate 10/15/2019   Appendicitis 09/08/2018   ARF (acute renal failure) ([place]) 02/19/2020   Asthma    Atherosclerosis of native arteries of the extremities with ulceration ([place]) 02/24/2018   Formatting of this note might be different from the original. Last Assessment & Plan:  His ABIs today are 1.07 on the right and 0.99 on the left with biphasic waveforms.  Although these pressures may be somewhat elevated from medial calcification, his flow does appear to be reasonably good.  His left ABI  was 0.58 prior to intervention. At this point, we will stretch out his follow-up and see him b   Benign hypertension with CKD (chronic kidney disease) stage IV ([place]) 02/24/2018   Last Assessment & Plan:  blood pressure control important in reducing the progression of atherosclerotic disease. On appropriate oral medications.   Benign prostatic hyperplasia without lower urinary tract symptoms  01/14/2018   Bilateral lower extremity edema 11/04/2018   Bradycardia 03/25/2018   Bruit of right carotid artery 07/26/2015   CAD (coronary artery disease) 07/26/2015   Formatting of this note might be different from the original. [name]  Formatting of this note might be different from the original. [name]   Cardiac murmur 07/26/2015   Chronic combined systolic and diastolic CHF (congestive heart failure) ([place]) 07/26/2015   Formatting of this note might be different from the original. [place] EF 35%   Chronic idiopathic constipation 11/02/2018   Chronic low back pain without sciatica 12/02/2018   Chronic pain of right hip 69/67/8938   Chronic systolic heart failure ([place]) 07/26/2015   Formatting of this note might be different from the original. [place] EF 35%   CKD (chronic kidney disease) stage 4, GFR 15-29 ml/min (HCC) 10/07/2018   Congestive heart failure ([place]) 07/26/2015   Last Assessment & Plan:  Poor cardiac function could certainly be contributing to poor blood supply to the feet and toes as well.  Formatting of this note might be different from the original. Formatting of this note might be different from the original. Last Assessment & Plan:  Poor cardiac function could certainly be contributing to poor blood supply to the feet and toes as well.  Last Assessmen   Coronary artery disease    Diabetes mellitus without complication ([place])    Dysphagia 07/26/2015   Encounter for long-term (current) use of insulin ([place]) 09/27/2020   Erectile dysfunction 07/14/2017   Fever 11/07/2020   Foot ulcer ([place])    left foot   GERD (gastroesophageal reflux disease)    Heart murmur    Hematuria 07/27/2015   High risk medication use 07/26/2015   Hyperkalemia 05/29/2020   Hyperlipidemia 07/26/2015   Hyperlipidemia associated with type 2 diabetes mellitus ([place]) 02/18/2020   Hypertension    Hypertension associated with diabetes ([place])    Hypoglycemia 05/07/2019   Hypothyroidism 11/14/2015    Hypothyroidism (acquired) 11/14/2015   Insomnia 11/02/2018   Kidney insufficiency    Lumbar spondylosis 03/20/2020   Malaise and fatigue 03/25/2018   Mild aortic stenosis 05/12/2019   Mitral regurgitation 05/12/2019   Moderate aortic regurgitation 05/12/2019   Non-compliance 05/05/2019   NSTEMI (non-ST elevated myocardial infarction) ([place]) 09/27/2020   Osseous and subluxation stenosis of intervertebral foramina of lumbar region 03/20/2020   Peripheral vascular disease ([place])    Personal history of tobacco use, presenting hazards to health 09/27/2020   Polyneuropathy in diabetes ([place]) 05/07/2019   Primary osteoarthritis involving multiple joints 10/06/2019   Primary osteoarthritis of right hip 02/21/2020   Puncture wound of right hip 04/02/2018   PVD (peripheral vascular disease) with claudication ([place]) 07/26/2015   Angioplasty 02/2018   Regional wall motion abnormality of heart 05/07/2019   Right groin pain 02/18/2020   Screening for colon cancer 02/18/2018   Sleep apnea 07/26/2015   Subacute osteomyelitis of left foot ([place]) 03/25/2018   Type 2 diabetes mellitus with stage 3 chronic kidney disease, with long-term current use of insulin ([place]) 07/26/2015   Type 2 diabetes mellitus with stage 4 chronic kidney disease, with long-term  current use of insulin ([place]) 07/26/2015   Type II diabetes mellitus with neurological manifestations ([place]) 02/18/2020   Vitamin B12 deficiency 06/16/2019   Vitamin D deficiency 05/07/2018    Past Surgical History:  Procedure Laterality Date   AMPUTATION TOE Left 03/13/2018   Procedure: AMPUTATION TOE-MPJ;  Surgeon: Samara Deist, DPM;  Location: [facility] ORS;  Service: Podiatry;  Laterality: Left;   ANGIOPLASTY     CARDIAC CATHETERIZATION     CARDIAC SURGERY     heart bypass (4)   CORONARY ARTERY BYPASS GRAFT     LAPAROSCOPIC APPENDECTOMY N/A 09/08/2018   Procedure: APPENDECTOMY LAPAROSCOPIC;  Surgeon: Jules Husbands, MD;  Location: [facility] ORS;  Service:  General;  Laterality: N/A;   LOWER EXTREMITY ANGIOGRAPHY Left 03/02/2018   Procedure: LOWER EXTREMITY ANGIOGRAPHY;  Surgeon: Algernon Huxley, MD;  Location: [facility];  Service: Cardiovascular;  Laterality: Left;   ROTATOR CUFF REPAIR Left     Current Medications: Current Meds  Medication Sig   acetaminophen (TYLENOL) 500 MG tablet Take 1,000 mg by mouth every 6 (six) hours as needed for moderate pain or headache.    aspirin 81 MG tablet Take 81 mg by mouth daily.   bumetanide (BUMEX) 2 MG tablet Take 1 tablet (2 mg total) by mouth 2 (two) times daily.   carvedilol (COREG) 6.25 MG tablet Take 1 tablet (6.25 mg total) by mouth 2 (two) times daily with a meal.   cloNIDine (CATAPRES) 0.3 MG tablet Take 1 tablet (0.3 mg total) by mouth at bedtime.   clopidogrel (PLAVIX) 75 MG tablet Take 1 tablet (75 mg total) by mouth daily.   gabapentin (NEURONTIN) 600 MG tablet Take 300 mg by mouth 2 (two) times daily.   insulin detemir (LEVEMIR) 100 unit/ml SOLN Inject 10-20 Units into the skin at bedtime as needed for high blood sugar.   isosorbide mononitrate (IMDUR) 120 MG 24 hr tablet Take 1 tablet (120 mg total) by mouth daily.   levothyroxine (SYNTHROID) 25 MCG tablet Take 25 mcg by mouth daily.    losartan (COZAAR) 25 MG tablet Take 1 tablet (25 mg total) by mouth daily.   lovastatin (MEVACOR) 20 MG tablet Take 1 tablet (20 mg total) by mouth at bedtime.   nitroGLYCERIN (NITROSTAT) 0.4 MG SL tablet Place 1 tablet (0.4 mg total) under the tongue every 5 (five) minutes as needed for chest pain.   tamsulosin (FLOMAX) 0.4 MG CAPS capsule Take 0.4 mg by mouth daily.   vitamin B-12 (CYANOCOBALAMIN) 500 MCG tablet Take 500 mcg by mouth daily.      Allergies:   Patient has no known allergies.   Social History   Socioeconomic History   Marital status: Married    Spouse name: Not on file   Number of children: Not on file   Years of education: Not on file   Highest education level: Not on file   Occupational History   Not on file  Tobacco Use   Smoking status: Former    Types: Cigarettes    Quit date: 2003    Years since quitting: 19.8   Smokeless tobacco: Never  Vaping Use   Vaping Use: Never used  Substance and Sexual Activity   Alcohol use: Not Currently    Comment: 2003   Drug use: Not Currently    Types: Marijuana   Sexual activity: Not on file  Other Topics Concern   Not on file  Social History Narrative   Not on file  Social Determinants of Health   Financial Resource Strain: Not on file  Food Insecurity: Not on file  Transportation Needs: Not on file  Physical Activity: Not on file  Stress: Not on file  Social Connections: Not on file     Family History: The patient's family history includes Diabetes in his mother; Heart attack in his brother; Heart disease in his brother; Hyperlipidemia in his mother; Hypertension in his mother; Lung disease in his father.  ROS:   Please see the history of present illness.    All other systems reviewed and are negative.  EKGs/Labs/Other Studies Reviewed:    The following studies were reviewed today: IMPRESSIONS     1. Left ventricular ejection fraction, by estimation, is 35 to 40%. The  left ventricle has moderately decreased function. The left ventricle  demonstrates regional wall motion abnormalities (see scoring  diagram/findings for description). There is  moderate concentric left ventricular hypertrophy. Left ventricular  diastolic parameters are consistent with Grade II diastolic dysfunction  (pseudonormalization). Elevated left atrial pressure.   2. Right ventricular systolic function is moderately reduced. The right  ventricular size is normal. There is mildly elevated pulmonary artery  systolic pressure.   3. Left atrial size was moderately dilated.   4. The mitral valve is normal in structure. Mild mitral valve  regurgitation. No evidence of mitral stenosis.   5. Low flow SVI. The aortic valve  has an indeterminant number of cusps.  There is moderate calcification of the aortic valve. Aortic valve  regurgitation is moderate. Moderate aortic valve stenosis.   6. There is moderate dilatation of the aortic root and of the ascending  aorta, measuring 39 mm.   7. The inferior vena cava is dilated in size with >50% respiratory  variability, suggesting right atrial pressure of 8 mmHg.   Recent Labs: 11/07/2020: ALT 11 11/09/2020: Magnesium 2.2 11/10/2020: Hemoglobin 10.1; Platelets 113 11/20/2020: BUN 50; Creatinine, Ser 2.52; Potassium 4.4; Sodium 139  Recent Lipid Panel    Component Value Date/Time   CHOL 77 (L) 08/10/2019 1043   TRIG 65 08/10/2019 1043   HDL 25 (L) 08/10/2019 1043   CHOLHDL 3.1 08/10/2019 1043   LDLCALC 37 08/10/2019 1043    Physical Exam:    VS:  BP (!) 158/80   Pulse 78   Ht 6' (1.829 m)   Wt 207 lb 12.8 oz (94.3 kg)   SpO2 98%   BMI 28.18 kg/m     Wt Readings from Last 3 Encounters:  04/10/21 207 lb 12.8 oz (94.3 kg)  03/05/21 209 lb 3.2 oz (94.9 kg)  11/20/20 204 lb 12.8 oz (92.9 kg)     GEN: Patient is in no acute distress HEENT: Normal NECK: No JVD; No carotid bruits LYMPHATICS: No lymphadenopathy CARDIAC: Hear sounds regular, 2/6 systolic murmur at the apex. RESPIRATORY:  Clear to auscultation without rales, wheezing or rhonchi  ABDOMEN: Soft, non-tender, non-distended MUSCULOSKELETAL:  No edema; No deformity  SKIN: Warm and dry NEUROLOGIC:  Alert and oriented x 3 PSYCHIATRIC:  Normal affect   Signed, Jenean Lindau, MD  04/10/2021 2:28 PM    [place]

## 2021-04-11 DIAGNOSIS — I5022 Chronic systolic (congestive) heart failure: Secondary | ICD-10-CM | POA: Diagnosis not present

## 2021-04-11 DIAGNOSIS — I2510 Atherosclerotic heart disease of native coronary artery without angina pectoris: Secondary | ICD-10-CM | POA: Diagnosis not present

## 2021-04-11 DIAGNOSIS — E1142 Type 2 diabetes mellitus with diabetic polyneuropathy: Secondary | ICD-10-CM | POA: Diagnosis not present

## 2021-04-11 DIAGNOSIS — Z794 Long term (current) use of insulin: Secondary | ICD-10-CM | POA: Diagnosis not present

## 2021-04-11 DIAGNOSIS — M7022 Olecranon bursitis, left elbow: Secondary | ICD-10-CM | POA: Diagnosis not present

## 2021-04-11 DIAGNOSIS — E039 Hypothyroidism, unspecified: Secondary | ICD-10-CM | POA: Diagnosis not present

## 2021-04-11 DIAGNOSIS — M7021 Olecranon bursitis, right elbow: Secondary | ICD-10-CM | POA: Diagnosis not present

## 2021-04-11 DIAGNOSIS — Z23 Encounter for immunization: Secondary | ICD-10-CM | POA: Diagnosis not present

## 2021-04-11 DIAGNOSIS — N184 Chronic kidney disease, stage 4 (severe): Secondary | ICD-10-CM | POA: Diagnosis not present

## 2021-04-11 DIAGNOSIS — I129 Hypertensive chronic kidney disease with stage 1 through stage 4 chronic kidney disease, or unspecified chronic kidney disease: Secondary | ICD-10-CM | POA: Diagnosis not present

## 2021-04-11 DIAGNOSIS — D638 Anemia in other chronic diseases classified elsewhere: Secondary | ICD-10-CM | POA: Diagnosis not present

## 2021-04-11 DIAGNOSIS — E782 Mixed hyperlipidemia: Secondary | ICD-10-CM | POA: Diagnosis not present

## 2021-04-11 DIAGNOSIS — E1122 Type 2 diabetes mellitus with diabetic chronic kidney disease: Secondary | ICD-10-CM | POA: Diagnosis not present

## 2021-07-10 ENCOUNTER — Other Ambulatory Visit: Payer: Self-pay

## 2021-07-16 ENCOUNTER — Encounter: Payer: Self-pay | Admitting: Cardiology

## 2021-07-16 ENCOUNTER — Other Ambulatory Visit: Payer: Self-pay

## 2021-07-16 ENCOUNTER — Ambulatory Visit: Payer: PPO | Admitting: Cardiology

## 2021-07-16 VITALS — BP 146/70 | HR 74 | Ht 72.0 in | Wt 210.2 lb

## 2021-07-16 DIAGNOSIS — I351 Nonrheumatic aortic (valve) insufficiency: Secondary | ICD-10-CM

## 2021-07-16 DIAGNOSIS — I350 Nonrheumatic aortic (valve) stenosis: Secondary | ICD-10-CM

## 2021-07-16 DIAGNOSIS — N184 Chronic kidney disease, stage 4 (severe): Secondary | ICD-10-CM

## 2021-07-16 DIAGNOSIS — N289 Disorder of kidney and ureter, unspecified: Secondary | ICD-10-CM

## 2021-07-16 DIAGNOSIS — I2510 Atherosclerotic heart disease of native coronary artery without angina pectoris: Secondary | ICD-10-CM | POA: Diagnosis not present

## 2021-07-16 DIAGNOSIS — E1122 Type 2 diabetes mellitus with diabetic chronic kidney disease: Secondary | ICD-10-CM

## 2021-07-16 DIAGNOSIS — I10 Essential (primary) hypertension: Secondary | ICD-10-CM

## 2021-07-16 DIAGNOSIS — Z794 Long term (current) use of insulin: Secondary | ICD-10-CM

## 2021-07-16 NOTE — Patient Instructions (Signed)

## 2021-07-16 NOTE — Progress Notes (Signed)
Cardiology Office Note:    Date:  07/16/2021   ID:  Phillip [name], DOB Feb 02, 1956, MRN [id number]  PCP:  Raina Mina., MD  Cardiologist:  Jenean Lindau, MD   Referring MD: Raina Mina., MD    ASSESSMENT:    1. Coronary artery disease involving native coronary artery of native heart without angina pectoris   2. Moderate aortic regurgitation   3. Aortic stenosis, moderate   4. Primary hypertension   5. Type 2 diabetes mellitus with stage 4 chronic kidney disease, with long-term current use of insulin ([place])   6. Kidney insufficiency    PLAN:    In order of problems listed above:  Coronary artery disease: Secondary prevention stressed to the patient.  Importance of compliance with diet and medications.  Verbalized understanding.  He was advised to walk at least half an hour a day 5 days a week and he promises to do so. Moderate aortic stenosis and moderate regurgitation: Stable at this time.  Medical management.  Asymptomatic.  In view of his advanced renal insufficiency our options are limited. Cardiomyopathy: Stable ejection fraction.  We will continue to monitor.  Not a candidate for medication such as Entresto because of renal insufficiency.  He is not keen on any other medications at this time.  I respect his wishes. Essential hypertension: Blood pressure stable and diet was emphasized.  He tells me that his blood pressures are fine at home. Mixed dyslipidemia: Managed by primary care.  Lipids reviewed.  This was from the [facility] sheet. Advanced renal insufficiency: Managed by primary care.  He is aware of it. Patient will be seen in follow-up appointment in 9 months or earlier if the patient has any concerns    Medication Adjustments/Labs and Tests Ordered: Current medicines are reviewed at length with the patient today.  Concerns regarding medicines are outlined above.  No orders of the defined types were placed in this encounter.  No orders of the defined types were  placed in this encounter.    No chief complaint on file.    History of Present Illness:    Phillip [name] is a 66 y.o. male.  Patient has past medical history of coronary artery disease, essential hypertension, mixed dyslipidemia and advanced renal insufficiency.  He has moderate aortic stenosis and moderate regurgitation.  Echocardiogram report is mentioned below.  Patient leads a sedentary lifestyle overall.  No chest pain orthopnea or PND.  At the time of my evaluation, the patient is alert awake oriented and in no distress.  He mentions to me that his blood pressures are fine at home.  Past Medical History:  Diagnosis Date   Abdominopelvic abscess ([place]) 09/29/2018   Abscess of appendix 09/22/2018   Acquired spondylolisthesis of lumbosacral region 03/20/2020   Acute kidney injury ([place]) 10/07/2018   Acute kidney injury superimposed on CKD ([place]) 02/18/2020   AKI (acute kidney injury) ([place]) 11/06/2020   Anemia of chronic disease 08/18/2019   Anemia, chronic disease 08/18/2019   Aortic stenosis, moderate 10/15/2019   Appendicitis 09/08/2018   ARF (acute renal failure) ([place]) 02/19/2020   Asthma    Atherosclerosis of native arteries of the extremities with ulceration ([place]) 02/24/2018   Formatting of this note might be different from the original. Last Assessment & Plan:  His ABIs today are 1.07 on the right and 0.99 on the left with biphasic waveforms.  Although these pressures may be somewhat elevated from medial calcification, his flow does appear to be  reasonably good.  His left ABI was 0.58 prior to intervention. At this point, we will stretch out his follow-up and see him b   Benign hypertension with CKD (chronic kidney disease) stage IV ([place]) 02/24/2018   Last Assessment & Plan:  blood pressure control important in reducing the progression of atherosclerotic disease. On appropriate oral medications.   Benign prostatic hyperplasia without lower urinary tract symptoms 01/14/2018    Bilateral lower extremity edema 11/04/2018   Bradycardia 03/25/2018   Bruit of right carotid artery 07/26/2015   CAD (coronary artery disease) 07/26/2015   Formatting of this note might be different from the original. [name]  Formatting of this note might be different from the original. [name]   Cardiac murmur 07/26/2015   Chronic combined systolic and diastolic CHF (congestive heart failure) ([place]) 07/26/2015   Formatting of this note might be different from the original. [place] EF 35%   Chronic idiopathic constipation 11/02/2018   Chronic low back pain without sciatica 12/02/2018   Chronic pain of right hip 47/42/5956   Chronic systolic heart failure ([place]) 07/26/2015   Formatting of this note might be different from the original. [place] EF 35%   CKD (chronic kidney disease) stage 4, GFR 15-29 ml/min (HCC) 10/07/2018   Congestive heart failure ([place]) 07/26/2015   Last Assessment & Plan:  Poor cardiac function could certainly be contributing to poor blood supply to the feet and toes as well.  Formatting of this note might be different from the original. Formatting of this note might be different from the original. Last Assessment & Plan:  Poor cardiac function could certainly be contributing to poor blood supply to the feet and toes as well.  Last Assessmen   Coronary artery disease    Diabetes mellitus without complication ([place])    Dysphagia 07/26/2015   Encounter for long-term (current) use of insulin ([place]) 09/27/2020   Erectile dysfunction 07/14/2017   Fever 11/07/2020   Foot ulcer ([place])    left foot   GERD (gastroesophageal reflux disease)    Heart murmur    Hematuria 07/27/2015   High risk medication use 07/26/2015   Hyperkalemia 05/29/2020   Hyperlipidemia 07/26/2015   Hyperlipidemia associated with type 2 diabetes mellitus ([place]) 02/18/2020   Hypertension    Hypertension associated with diabetes ([place])    Hypoglycemia 05/07/2019   Hypothyroidism 11/14/2015    Hypothyroidism (acquired) 11/14/2015   Insomnia 11/02/2018   Kidney insufficiency    Lumbar spondylosis 03/20/2020   Malaise and fatigue 03/25/2018   Mitral regurgitation 05/12/2019   Moderate aortic regurgitation 05/12/2019   Non-compliance 05/05/2019   NSTEMI (non-ST elevated myocardial infarction) ([place]) 09/27/2020   Osseous and subluxation stenosis of intervertebral foramina of lumbar region 03/20/2020   Peripheral vascular disease ([place])    Personal history of tobacco use, presenting hazards to health 09/27/2020   Polyneuropathy in diabetes ([place]) 05/07/2019   Primary osteoarthritis involving multiple joints 10/06/2019   Primary osteoarthritis of right hip 02/21/2020   Puncture wound of right hip 04/02/2018   PVD (peripheral vascular disease) with claudication ([place]) 07/26/2015   Angioplasty 02/2018   Regional wall motion abnormality of heart 05/07/2019   Right groin pain 02/18/2020   Screening for colon cancer 02/18/2018   Sleep apnea 07/26/2015   Subacute osteomyelitis of left foot ([place]) 03/25/2018   Type 2 diabetes mellitus with stage 3 chronic kidney disease, with long-term current use of insulin ([place]) 07/26/2015   Type 2 diabetes mellitus with stage 4 chronic kidney disease, with long-term  current use of insulin ([place]) 07/26/2015   Type II diabetes mellitus with neurological manifestations ([place]) 02/18/2020   Vitamin B12 deficiency 06/16/2019   Vitamin D deficiency 05/07/2018    Past Surgical History:  Procedure Laterality Date   AMPUTATION TOE Left 03/13/2018   Procedure: AMPUTATION TOE-MPJ;  Surgeon: Samara Deist, DPM;  Location: [facility] ORS;  Service: Podiatry;  Laterality: Left;   ANGIOPLASTY     CARDIAC CATHETERIZATION     CARDIAC SURGERY     heart bypass (4)   CORONARY ARTERY BYPASS GRAFT     LAPAROSCOPIC APPENDECTOMY N/A 09/08/2018   Procedure: APPENDECTOMY LAPAROSCOPIC;  Surgeon: Jules Husbands, MD;  Location: [facility] ORS;  Service: General;  Laterality: N/A;   LOWER  EXTREMITY ANGIOGRAPHY Left 03/02/2018   Procedure: LOWER EXTREMITY ANGIOGRAPHY;  Surgeon: Algernon Huxley, MD;  Location: [facility];  Service: Cardiovascular;  Laterality: Left;   ROTATOR CUFF REPAIR Left     Current Medications: Current Meds  Medication Sig   acetaminophen (TYLENOL) 500 MG tablet Take 1,000 mg by mouth every 6 (six) hours as needed for moderate pain or headache.    bumetanide (BUMEX) 2 MG tablet Take 1 tablet (2 mg total) by mouth 2 (two) times daily.   carvedilol (COREG) 25 MG tablet Take 25 mg by mouth 2 (two) times daily.   cloNIDine (CATAPRES) 0.3 MG tablet Take 1 tablet (0.3 mg total) by mouth at bedtime.   clopidogrel (PLAVIX) 75 MG tablet Take 1 tablet (75 mg total) by mouth daily.   gabapentin (NEURONTIN) 600 MG tablet Take 300 mg by mouth 2 (two) times daily.   insulin detemir (LEVEMIR) 100 unit/ml SOLN Inject 10-20 Units into the skin at bedtime as needed for high blood sugar.   isosorbide mononitrate (IMDUR) 120 MG 24 hr tablet Take 1 tablet (120 mg total) by mouth daily.   levothyroxine (SYNTHROID) 25 MCG tablet Take 25 mcg by mouth daily.    losartan (COZAAR) 25 MG tablet Take 1 tablet (25 mg total) by mouth daily.   lovastatin (MEVACOR) 40 MG tablet Take 20 mg by mouth at bedtime.   nitroGLYCERIN (NITROSTAT) 0.4 MG SL tablet Place 1 tablet (0.4 mg total) under the tongue every 5 (five) minutes as needed for chest pain.   tamsulosin (FLOMAX) 0.4 MG CAPS capsule Take 0.4 mg by mouth daily.   vitamin B-12 (CYANOCOBALAMIN) 500 MCG tablet Take 500 mcg by mouth daily.    Vitamin D, Ergocalciferol, (DRISDOL) 1.25 MG (50000 UNIT) CAPS capsule Take 50,000 Units by mouth once a week.     Allergies:   Patient has no known allergies.   Social History   Socioeconomic History   Marital status: Married    Spouse name: Not on file   Number of children: Not on file   Years of education: Not on file   Highest education level: Not on file  Occupational History    Not on file  Tobacco Use   Smoking status: Former    Types: Cigarettes    Quit date: 2003    Years since quitting: 20.1   Smokeless tobacco: Never  Vaping Use   Vaping Use: Never used  Substance and Sexual Activity   Alcohol use: Not Currently    Comment: 2003   Drug use: Not Currently    Types: Marijuana   Sexual activity: Not on file  Other Topics Concern   Not on file  Social History Narrative   Not on file   Social  Determinants of Health   Financial Resource Strain: Not on file  Food Insecurity: Not on file  Transportation Needs: Not on file  Physical Activity: Not on file  Stress: Not on file  Social Connections: Not on file     Family History: The patient's family history includes Diabetes in his mother; Heart attack in his brother; Heart disease in his brother; Hyperlipidemia in his mother; Hypertension in his mother; Lung disease in his father.  ROS:   Please see the history of present illness.    All other systems reviewed and are negative.  EKGs/Labs/Other Studies Reviewed:    The following studies were reviewed today: IMPRESSIONS     1. Left ventricular ejection fraction, by estimation, is 35 to 40%. The  left ventricle has moderately decreased function. The left ventricle  demonstrates regional wall motion abnormalities (see scoring  diagram/findings for description). There is  moderate concentric left ventricular hypertrophy. Left ventricular  diastolic parameters are consistent with Grade II diastolic dysfunction  (pseudonormalization). Elevated left atrial pressure.   2. Right ventricular systolic function is moderately reduced. The right  ventricular size is normal. There is mildly elevated pulmonary artery  systolic pressure.   3. Left atrial size was moderately dilated.   4. The mitral valve is normal in structure. Mild mitral valve  regurgitation. No evidence of mitral stenosis.   5. Low flow SVI. The aortic valve has an indeterminant  number of cusps.  There is moderate calcification of the aortic valve. Aortic valve  regurgitation is moderate. Moderate aortic valve stenosis.   6. There is moderate dilatation of the aortic root and of the ascending  aorta, measuring 39 mm.   7. The inferior vena cava is dilated in size with >50% respiratory  variability, suggesting right atrial pressure of 8 mmHg.    Recent Labs: 11/07/2020: ALT 11 11/09/2020: Magnesium 2.2 11/10/2020: Hemoglobin 10.1; Platelets 113 11/20/2020: BUN 50; Creatinine, Ser 2.52; Potassium 4.4; Sodium 139  Recent Lipid Panel    Component Value Date/Time   CHOL 77 (L) 08/10/2019 1043   TRIG 65 08/10/2019 1043   HDL 25 (L) 08/10/2019 1043   CHOLHDL 3.1 08/10/2019 1043   LDLCALC 37 08/10/2019 1043    Physical Exam:    VS:  BP (!) 146/70    Pulse 74    Ht 6' (1.829 m)    Wt 210 lb 3.2 oz (95.3 kg)    SpO2 98%    BMI 28.51 kg/m     Wt Readings from Last 3 Encounters:  07/16/21 210 lb 3.2 oz (95.3 kg)  04/10/21 207 lb 12.8 oz (94.3 kg)  03/05/21 209 lb 3.2 oz (94.9 kg)     GEN: Patient is in no acute distress HEENT: Normal NECK: No JVD; No carotid bruits LYMPHATICS: No lymphadenopathy CARDIAC: Hear sounds regular, 2/6 systolic murmur at the apex. RESPIRATORY:  Clear to auscultation without rales, wheezing or rhonchi  ABDOMEN: Soft, non-tender, non-distended MUSCULOSKELETAL:  No edema; No deformity  SKIN: Warm and dry NEUROLOGIC:  Alert and oriented x 3 PSYCHIATRIC:  Normal affect   Signed, Jenean Lindau, MD  07/16/2021 10:03 AM    [place]

## 2021-11-29 ENCOUNTER — Encounter (INDEPENDENT_AMBULATORY_CARE_PROVIDER_SITE_OTHER): Payer: PPO

## 2021-11-29 ENCOUNTER — Encounter (INDEPENDENT_AMBULATORY_CARE_PROVIDER_SITE_OTHER): Payer: PPO | Admitting: Vascular Surgery

## 2021-12-10 ENCOUNTER — Other Ambulatory Visit: Payer: Self-pay | Admitting: Cardiology

## 2021-12-10 DIAGNOSIS — I10 Essential (primary) hypertension: Secondary | ICD-10-CM

## 2021-12-10 DIAGNOSIS — E782 Mixed hyperlipidemia: Secondary | ICD-10-CM

## 2021-12-10 DIAGNOSIS — I2510 Atherosclerotic heart disease of native coronary artery without angina pectoris: Secondary | ICD-10-CM

## 2022-01-18 ENCOUNTER — Other Ambulatory Visit (INDEPENDENT_AMBULATORY_CARE_PROVIDER_SITE_OTHER): Payer: Self-pay | Admitting: Vascular Surgery

## 2022-01-18 DIAGNOSIS — L97509 Non-pressure chronic ulcer of other part of unspecified foot with unspecified severity: Secondary | ICD-10-CM

## 2022-01-20 NOTE — Progress Notes (Signed)
MRN : [id number]  Phillip [name] is a 66 y.o. ([birth year]) male who presents with chief complaint of check circulation.  History of Present Illness:   The patient returns to the office for followup and review of the noninvasive studies.   The patient notes that there has been a significant deterioration in the lower extremity symptoms.  The patient notes interval shortening of their claudication distance and development of mild rest pain symptoms. No new ulcers or wounds have occurred since the last visit.  There have been no significant changes to the patient's overall health care.  The patient denies amaurosis fugax or recent TIA symptoms. There are no recent neurological changes noted. There is no history of DVT, PE or superficial thrombophlebitis. The patient denies recent episodes of angina or shortness of breath.   ABI's Rt=[place] (monophasic) and Lt=[place] (monophasic) (previous ABI's Rt=1.07 and Lt=[place])   No outpatient medications have been marked as taking for the 01/21/22 encounter (Appointment) with Delana Meyer, Dolores Lory, MD.    Past Medical History:  Diagnosis Date   Abdominopelvic abscess ([place]) 09/29/2018   Abscess of appendix 09/22/2018   Acquired spondylolisthesis of lumbosacral region 03/20/2020   Acute kidney injury ([place]) 10/07/2018   Acute kidney injury superimposed on CKD ([place]) 02/18/2020   AKI (acute kidney injury) ([place]) 11/06/2020   Anemia of chronic disease 08/18/2019   Anemia, chronic disease 08/18/2019   Aortic stenosis, moderate 10/15/2019   Appendicitis 09/08/2018   ARF (acute renal failure) ([place]) 02/19/2020   Asthma    Atherosclerosis of native arteries of the extremities with ulceration ([place]) 02/24/2018   Formatting of this note might be different from the original. Last Assessment & Plan:  His ABIs today are 1.07 on the right and 0.99 on the left with biphasic waveforms.  Although these pressures may be somewhat elevated from medial calcification, his  flow does appear to be reasonably good.  His left ABI was 0.58 prior to intervention. At this point, we will stretch out his follow-up and see him b   Benign hypertension with CKD (chronic kidney disease) stage IV ([place]) 02/24/2018   Last Assessment & Plan:  blood pressure control important in reducing the progression of atherosclerotic disease. On appropriate oral medications.   Benign prostatic hyperplasia without lower urinary tract symptoms 01/14/2018   Bilateral lower extremity edema 11/04/2018   Bradycardia 03/25/2018   Bruit of right carotid artery 07/26/2015   CAD (coronary artery disease) 07/26/2015   Formatting of this note might be different from the original. [name]  Formatting of this note might be different from the original. [name]   Cardiac murmur 07/26/2015   Chronic combined systolic and diastolic CHF (congestive heart failure) ([place]) 07/26/2015   Formatting of this note might be different from the original. [place] EF 35%   Chronic idiopathic constipation 11/02/2018   Chronic low back pain without sciatica 12/02/2018   Chronic pain of right hip 09/98/3382   Chronic systolic heart failure ([place]) 07/26/2015   Formatting of this note might be different from the original. [place] EF 35%   CKD (chronic kidney disease) stage 4, GFR 15-29 ml/min (HCC) 10/07/2018   Congestive heart failure ([place]) 07/26/2015   Last Assessment & Plan:  Poor cardiac function could certainly be contributing to poor blood supply to the feet and toes as well.  Formatting of this note might be different from the original. Formatting of this note might be different from the original. Last Assessment & Plan:  Poor cardiac function  could certainly be contributing to poor blood supply to the feet and toes as well.  Last Assessmen   Coronary artery disease    Diabetes mellitus without complication ([place])    Dysphagia 07/26/2015   Encounter for long-term (current) use of insulin ([place]) 09/27/2020   Erectile  dysfunction 07/14/2017   Fever 11/07/2020   Foot ulcer ([place])    left foot   GERD (gastroesophageal reflux disease)    Heart murmur    Hematuria 07/27/2015   High risk medication use 07/26/2015   Hyperkalemia 05/29/2020   Hyperlipidemia 07/26/2015   Hyperlipidemia associated with type 2 diabetes mellitus ([place]) 02/18/2020   Hypertension    Hypertension associated with diabetes ([place])    Hypoglycemia 05/07/2019   Hypothyroidism 11/14/2015   Hypothyroidism (acquired) 11/14/2015   Insomnia 11/02/2018   Kidney insufficiency    Lumbar spondylosis 03/20/2020   Malaise and fatigue 03/25/2018   Mitral regurgitation 05/12/2019   Moderate aortic regurgitation 05/12/2019   Non-compliance 05/05/2019   NSTEMI (non-ST elevated myocardial infarction) ([place]) 09/27/2020   Osseous and subluxation stenosis of intervertebral foramina of lumbar region 03/20/2020   Peripheral vascular disease ([place])    Personal history of tobacco use, presenting hazards to health 09/27/2020   Polyneuropathy in diabetes ([place]) 05/07/2019   Primary osteoarthritis involving multiple joints 10/06/2019   Primary osteoarthritis of right hip 02/21/2020   Puncture wound of right hip 04/02/2018   PVD (peripheral vascular disease) with claudication ([place]) 07/26/2015   Angioplasty 02/2018   Regional wall motion abnormality of heart 05/07/2019   Right groin pain 02/18/2020   Screening for colon cancer 02/18/2018   Sleep apnea 07/26/2015   Subacute osteomyelitis of left foot ([place]) 03/25/2018   Type 2 diabetes mellitus with stage 3 chronic kidney disease, with long-term current use of insulin ([place]) 07/26/2015   Type 2 diabetes mellitus with stage 4 chronic kidney disease, with long-term current use of insulin ([place]) 07/26/2015   Type II diabetes mellitus with neurological manifestations ([place]) 02/18/2020   Vitamin B12 deficiency 06/16/2019   Vitamin D deficiency 05/07/2018    Past Surgical History:  Procedure Laterality Date    AMPUTATION TOE Left 03/13/2018   Procedure: AMPUTATION TOE-MPJ;  Surgeon: Samara Deist, DPM;  Location: [facility];  Service: Podiatry;  Laterality: Left;   ANGIOPLASTY     CARDIAC CATHETERIZATION     CARDIAC SURGERY     heart bypass (4)   CORONARY ARTERY BYPASS GRAFT     LAPAROSCOPIC APPENDECTOMY N/A 09/08/2018   Procedure: APPENDECTOMY LAPAROSCOPIC;  Surgeon: Jules Husbands, MD;  Location: [facility];  Service: General;  Laterality: N/A;   LOWER EXTREMITY ANGIOGRAPHY Left 03/02/2018   Procedure: LOWER EXTREMITY ANGIOGRAPHY;  Surgeon: Algernon Huxley, MD;  Location: [facility];  Service: Cardiovascular;  Laterality: Left;   ROTATOR CUFF REPAIR Left     Social History Social History   Tobacco Use   Smoking status: Former    Types: Cigarettes    Quit date: 2003    Years since quitting: 20.6   Smokeless tobacco: Never  Vaping Use   Vaping Use: Never used  Substance Use Topics   Alcohol use: Not Currently    Comment: 2003   Drug use: Not Currently    Types: Marijuana    Family History Family History  Problem Relation Age of Onset   Hyperlipidemia Mother    Hypertension Mother    Diabetes Mother    Heart attack Brother    Heart disease Brother  Lung disease Father     No Known Allergies   REVIEW OF SYSTEMS (Negative unless checked)  Constitutional: [] Weight loss  [] Fever  [] Chills Cardiac: [] Chest pain   [] Chest pressure   [] Palpitations   [] Shortness of breath when laying flat   [] Shortness of breath with exertion. Vascular:  [x] Pain in legs with walking   [] Pain in legs at rest  [] History of DVT   [] Phlebitis   [] Swelling in legs   [] Varicose veins   [] Non-healing ulcers Pulmonary:   [] Uses home oxygen   [] Productive cough   [] Hemoptysis   [] Wheeze  [] COPD   [] Asthma Neurologic:  [] Dizziness   [] Seizures   [] History of stroke   [] History of TIA  [] Aphasia   [] Vissual changes   [] Weakness or numbness in arm   [] Weakness or numbness in leg Musculoskeletal:    [] Joint swelling   [] Joint pain   [] Low back pain Hematologic:  [] Easy bruising  [] Easy bleeding   [] Hypercoagulable state   [] Anemic Gastrointestinal:  [] Diarrhea   [] Vomiting  [] Gastroesophageal reflux/heartburn   [] Difficulty swallowing. Genitourinary:  [] Chronic kidney disease   [] Difficult urination  [] Frequent urination   [] Blood in urine Skin:  [] Rashes   [] Ulcers  Psychological:  [] History of anxiety   []  History of major depression.  Physical Examination  There were no vitals filed for this visit. There is no height or weight on file to calculate BMI. Gen: WD/WN, NAD Head: [place]/AT, No temporalis wasting.  Ear/Nose/Throat: Hearing grossly intact, nares w/o erythema or drainage Eyes: PER, EOMI, sclera nonicteric.  Neck: Supple, no masses.  No bruit or JVD.  Pulmonary:  Good air movement, no audible wheezing, no use of accessory muscles.  Cardiac: RRR, normal S1, S2, no Murmurs. Vascular:  mild trophic changes, small callous right great toe without infection Vessel Right Left  Radial Palpable Palpable  PT Not Palpable Not Palpable  DP Not Palpable Not Palpable  Gastrointestinal: soft, non-distended. No guarding/no peritoneal signs.  Musculoskeletal: M/S 5/5 throughout.  No visible deformity.  Neurologic: CN 2-12 intact. Pain and light touch intact in extremities.  Symmetrical.  Speech is fluent. Motor exam as listed above. Psychiatric: Judgment intact, Mood & affect appropriate for pt's clinical situation. Dermatologic: No rashes or ulcers noted.  No changes consistent with cellulitis.   CBC Lab Results  Component Value Date   WBC 7.4 11/10/2020   HGB 10.1 (L) 11/10/2020   HCT 30.8 (L) 11/10/2020   MCV 88.3 11/10/2020   PLT 113 (L) 11/10/2020    BMET    Component Value Date/Time   NA 139 11/20/2020 1343   NA 136 08/30/2013 1025   K 4.4 11/20/2020 1343   K 3.7 08/30/2013 1025   CL 102 11/20/2020 1343   CL 101 08/30/2013 1025   CO2 24 11/20/2020 1343   CO2 31  08/30/2013 1025   GLUCOSE 214 (H) 11/20/2020 1343   GLUCOSE 130 (H) 11/10/2020 0448   GLUCOSE 310 (H) 08/30/2013 1025   BUN 50 (H) 11/20/2020 1343   BUN 17 08/30/2013 1025   CREATININE 2.52 (H) 11/20/2020 1343   CREATININE 1.15 08/30/2013 1025   CALCIUM 8.2 (L) 11/20/2020 1343   CALCIUM 9.4 08/30/2013 1025   GFRNONAA 21 (L) 11/10/2020 0448   GFRNONAA >60 08/30/2013 1025   GFRAA 27 (L) 02/22/2020 0539   GFRAA >60 08/30/2013 1025   CrCl cannot be calculated (Patient's most recent lab result is older than the maximum 21 days allowed.).  COAG Lab Results  Component Value  Date   INR 1.2 09/23/2018   INR 1.3 (H) 09/22/2018   INR 1.13 03/11/2018    Radiology No results found.   Assessment/Plan 1. Atherosclerosis of native arteries of the extremities with ulceration ([place])  Recommend:  The patient has evidence of atherosclerosis of the lower extremities with claudication.  The patient does not voice lifestyle limiting changes at this point in time.  His wound seems to be closed , more of a callous and no infection  Noninvasive studies do not suggest critical ischemia.  No invasive studies, angiography or surgery at this time The patient should continue walking and begin a more formal exercise program.  The patient should continue antiplatelet therapy and aggressive treatment of the lipid abnormalities  No changes in the patient's medications at this time  Continued surveillance is indicated as atherosclerosis is likely to progress with time.    The patient will continue follow up with noninvasive studies as ordered and I will see him in 2 months for a wound check.    2. Primary hypertension Continue antihypertensive medications as already ordered, these medications have been reviewed and there are no changes at this time.   3. Coronary artery disease involving native coronary artery of native heart without angina pectoris Continue cardiac and antihypertensive medications as  already ordered and reviewed, no changes at this time.  Continue statin as ordered and reviewed, no changes at this time  Nitrates PRN for chest pain   4. Asthma, unspecified asthma severity, unspecified whether complicated, unspecified whether persistent Continue pulmonary medications and aerosols as already ordered, these medications have been reviewed and there are no changes at this time.    5. Type 2 diabetes mellitus with stage 4 chronic kidney disease, with long-term current use of insulin (HCC) Continue hypoglycemic medications as already ordered, these medications have been reviewed and there are no changes at this time.  Hgb A1C to be monitored as already arranged by primary service   6. Non-pressure chronic ulcer of other part of unspecified foot with unspecified severity ([place]) See #1 - VAS [place] ABI WITH/WO TBI    Hortencia Pilar, MD  01/20/2022 5:16 PM

## 2022-01-21 ENCOUNTER — Ambulatory Visit (INDEPENDENT_AMBULATORY_CARE_PROVIDER_SITE_OTHER): Payer: PPO

## 2022-01-21 ENCOUNTER — Ambulatory Visit (INDEPENDENT_AMBULATORY_CARE_PROVIDER_SITE_OTHER): Payer: PPO | Admitting: Vascular Surgery

## 2022-01-21 ENCOUNTER — Encounter (INDEPENDENT_AMBULATORY_CARE_PROVIDER_SITE_OTHER): Payer: Self-pay | Admitting: Vascular Surgery

## 2022-01-21 VITALS — BP 144/72 | HR 64 | Resp 16 | Wt 212.8 lb

## 2022-01-21 DIAGNOSIS — I10 Essential (primary) hypertension: Secondary | ICD-10-CM | POA: Diagnosis not present

## 2022-01-21 DIAGNOSIS — L97509 Non-pressure chronic ulcer of other part of unspecified foot with unspecified severity: Secondary | ICD-10-CM | POA: Diagnosis not present

## 2022-01-21 DIAGNOSIS — I7025 Atherosclerosis of native arteries of other extremities with ulceration: Secondary | ICD-10-CM

## 2022-01-21 DIAGNOSIS — E1122 Type 2 diabetes mellitus with diabetic chronic kidney disease: Secondary | ICD-10-CM

## 2022-01-21 DIAGNOSIS — J45909 Unspecified asthma, uncomplicated: Secondary | ICD-10-CM

## 2022-01-21 DIAGNOSIS — N184 Chronic kidney disease, stage 4 (severe): Secondary | ICD-10-CM

## 2022-01-21 DIAGNOSIS — I2510 Atherosclerotic heart disease of native coronary artery without angina pectoris: Secondary | ICD-10-CM | POA: Diagnosis not present

## 2022-01-21 DIAGNOSIS — Z794 Long term (current) use of insulin: Secondary | ICD-10-CM

## 2022-01-28 ENCOUNTER — Encounter (INDEPENDENT_AMBULATORY_CARE_PROVIDER_SITE_OTHER): Payer: Self-pay | Admitting: Vascular Surgery

## 2022-02-22 ENCOUNTER — Telehealth (INDEPENDENT_AMBULATORY_CARE_PROVIDER_SITE_OTHER): Payer: Self-pay | Admitting: Vascular Surgery

## 2022-02-22 NOTE — Telephone Encounter (Signed)
If the schedule will allow, we can get him in with an arterial duplex

## 2022-02-22 NOTE — Telephone Encounter (Signed)
Patient LVM stating he has a pain in his left thigh that won't go away and its been going on for a week or so.  Pt. has an appointment on 03/04/22 but would like to be seen sooner due to the pain.  Please advise.

## 2022-02-27 ENCOUNTER — Other Ambulatory Visit (INDEPENDENT_AMBULATORY_CARE_PROVIDER_SITE_OTHER): Payer: Self-pay | Admitting: Vascular Surgery

## 2022-02-27 ENCOUNTER — Other Ambulatory Visit (INDEPENDENT_AMBULATORY_CARE_PROVIDER_SITE_OTHER): Payer: Self-pay | Admitting: Nurse Practitioner

## 2022-02-27 DIAGNOSIS — Z9889 Other specified postprocedural states: Secondary | ICD-10-CM

## 2022-02-27 DIAGNOSIS — I7025 Atherosclerosis of native arteries of other extremities with ulceration: Secondary | ICD-10-CM

## 2022-02-27 DIAGNOSIS — M79652 Pain in left thigh: Secondary | ICD-10-CM

## 2022-03-02 NOTE — Progress Notes (Signed)
MRN : [id number]  Phillip [name] is a 66 y.o. ([birth year]) male who presents with chief complaint of check circulation.  History of Present Illness:   The patient returns to the office for followup and review of the noninvasive studies.    The patient notes that there has been a significant deterioration in the lower extremity symptoms.  The patient notes interval shortening of their claudication distance and development of mild rest pain symptoms. No new ulcers or wounds have occurred since the last visit but the "callous" has not improved.   There have been no significant changes to the patient's overall health care.   The patient denies amaurosis fugax or recent TIA symptoms. There are no recent neurological changes noted. There is no history of DVT, PE or superficial thrombophlebitis. The patient denies recent episodes of angina or shortness of breath.    Previous ABI's Rt=[place] (monophasic) and Lt=[place] (monophasic) (previous ABI's Rt=1.07 and Lt=[place])  Arterial Duplex of the bilateral lower extremities shows patent arterial system bilaterally without evidence of a focal hemodynamically significant stenosis.  No outpatient medications have been marked as taking for the 03/04/22 encounter (Appointment) with Phillip [name], Phillip Lory, MD.    Past Medical History:  Diagnosis Date   Abdominopelvic abscess (Phillip [name]) 09/29/2018   Abscess of appendix 09/22/2018   Acquired spondylolisthesis of lumbosacral region 03/20/2020   Acute kidney injury (Phillip [name]) 10/07/2018   Acute kidney injury superimposed on CKD ([place]) 02/18/2020   AKI (acute kidney injury) (Phillip [name]) 11/06/2020   Anemia of chronic disease 08/18/2019   Anemia, chronic disease 08/18/2019   Aortic stenosis, moderate 10/15/2019   Appendicitis 09/08/2018   ARF (acute renal failure) ([place]) 02/19/2020   Asthma    Atherosclerosis of native arteries of the extremities with ulceration (Phillip [name]) 02/24/2018   Formatting of this note might be  different from the original. Last Assessment & Plan:  His ABIs today are 1.07 on the right and 0.99 on the left with biphasic waveforms.  Although these pressures may be somewhat elevated from medial calcification, his flow does appear to be reasonably good.  His left ABI was 0.58 prior to intervention. At this point, we will stretch out his follow-up and see him b   Benign hypertension with CKD (chronic kidney disease) stage IV (Phillip [name]) 02/24/2018   Last Assessment & Plan:  blood pressure control important in reducing the progression of atherosclerotic disease. On appropriate oral medications.   Benign prostatic hyperplasia without lower urinary tract symptoms 01/14/2018   Bilateral lower extremity edema 11/04/2018   Bradycardia 03/25/2018   Bruit of right carotid artery 07/26/2015   CAD (coronary artery disease) 07/26/2015   Formatting of this note might be different from the original. Phillip [name]  Formatting of this note might be different from the original. Phillip [name]   Cardiac murmur 07/26/2015   Chronic combined systolic and diastolic CHF (congestive heart failure) (Phillip [name]) 07/26/2015   Formatting of this note might be different from the original. Phillip [name] EF 35%   Chronic idiopathic constipation 11/02/2018   Chronic low back pain without sciatica 12/02/2018   Chronic pain of right hip 95/63/8756   Chronic systolic heart failure ([place]) 07/26/2015   Formatting of this note might be different from the original. Phillip [name] EF 35%   CKD (chronic kidney disease) stage 4, GFR 15-29 ml/min (HCC) 10/07/2018   Congestive heart failure (Phillip [name]) 07/26/2015   Last Assessment & Plan:  Poor cardiac function  could certainly be contributing to poor blood supply to the feet and toes as well.  Formatting of this note might be different from the original. Formatting of this note might be different from the original. Last Assessment & Plan:  Poor cardiac function could certainly be contributing to poor blood supply to the feet  and toes as well.  Last Assessmen   Coronary artery disease    Diabetes mellitus without complication ([place])    Dysphagia 07/26/2015   Encounter for long-term (current) use of insulin (Phillip [name]) 09/27/2020   Erectile dysfunction 07/14/2017   Fever 11/07/2020   Foot ulcer (Phillip [name])    left foot   GERD (gastroesophageal reflux disease)    Heart murmur    Hematuria 07/27/2015   High risk medication use 07/26/2015   Hyperkalemia 05/29/2020   Hyperlipidemia 07/26/2015   Hyperlipidemia associated with type 2 diabetes mellitus ([place]) 02/18/2020   Hypertension    Hypertension associated with diabetes ([place])    Hypoglycemia 05/07/2019   Hypothyroidism 11/14/2015   Hypothyroidism (acquired) 11/14/2015   Insomnia 11/02/2018   Kidney insufficiency    Lumbar spondylosis 03/20/2020   Malaise and fatigue 03/25/2018   Mitral regurgitation 05/12/2019   Moderate aortic regurgitation 05/12/2019   Non-compliance 05/05/2019   NSTEMI (non-ST elevated myocardial infarction) (Phillip [name]) 09/27/2020   Osseous and subluxation stenosis of intervertebral foramina of lumbar region 03/20/2020   Peripheral vascular disease (Phillip [name])    Personal history of tobacco use, presenting hazards to health 09/27/2020   Polyneuropathy in diabetes ([place]) 05/07/2019   Primary osteoarthritis involving multiple joints 10/06/2019   Primary osteoarthritis of right hip 02/21/2020   Puncture wound of right hip 04/02/2018   PVD (peripheral vascular disease) with claudication (Phillip [name]) 07/26/2015   Angioplasty 02/2018   Regional wall motion abnormality of heart 05/07/2019   Right groin pain 02/18/2020   Screening for colon cancer 02/18/2018   Sleep apnea 07/26/2015   Subacute osteomyelitis of left foot (Phillip [name]) 03/25/2018   Type 2 diabetes mellitus with stage 3 chronic kidney disease, with long-term current use of insulin (Phillip [name]) 07/26/2015   Type 2 diabetes mellitus with stage 4 chronic kidney disease, with long-term current use of insulin (Phillip [name]) 07/26/2015    Type II diabetes mellitus with neurological manifestations (Phillip [name]) 02/18/2020   Vitamin B12 deficiency 06/16/2019   Vitamin D deficiency 05/07/2018    Past Surgical History:  Procedure Laterality Date   AMPUTATION TOE Left 03/13/2018   Procedure: AMPUTATION TOE-MPJ;  Surgeon: Samara Deist, DPM;  Location: [facility];  Service: Podiatry;  Laterality: Left;   ANGIOPLASTY     CARDIAC CATHETERIZATION     CARDIAC SURGERY     heart bypass (4)   CORONARY ARTERY BYPASS GRAFT     LAPAROSCOPIC APPENDECTOMY N/A 09/08/2018   Procedure: APPENDECTOMY LAPAROSCOPIC;  Surgeon: Jules Husbands, MD;  Location: [facility];  Service: General;  Laterality: N/A;   LOWER EXTREMITY ANGIOGRAPHY Left 03/02/2018   Procedure: LOWER EXTREMITY ANGIOGRAPHY;  Surgeon: Algernon Huxley, MD;  Location: [facility];  Service: Cardiovascular;  Laterality: Left;   ROTATOR CUFF REPAIR Left     Social History Social History   Tobacco Use   Smoking status: Former    Types: Cigarettes    Quit date: 2003    Years since quitting: 20.7   Smokeless tobacco: Never  Vaping Use   Vaping Use: Never used  Substance Use Topics   Alcohol use: Not Currently    Comment: 2003   Drug use: Not Currently  Types: Marijuana    Family History Family History  Problem Relation Age of Onset   Hyperlipidemia Mother    Hypertension Mother    Diabetes Mother    Heart attack Brother    Heart disease Brother    Lung disease Father     No Known Allergies   REVIEW OF SYSTEMS (Negative unless checked)  Constitutional: [] Weight loss  [] Fever  [] Chills Cardiac: [] Chest pain   [] Chest pressure   [] Palpitations   [] Shortness of breath when laying flat   [] Shortness of breath with exertion. Vascular:  [x] Pain in legs with walking   [] Pain in legs at rest  [] History of DVT   [] Phlebitis   [] Swelling in legs   [] Varicose veins   [] Non-healing ulcers Pulmonary:   [] Uses home oxygen   [] Productive cough   [] Hemoptysis   [] Wheeze  [] COPD    [] Asthma Neurologic:  [] Dizziness   [] Seizures   [] History of stroke   [] History of TIA  [] Aphasia   [] Vissual changes   [] Weakness or numbness in arm   [] Weakness or numbness in leg Musculoskeletal:   [] Joint swelling   [] Joint pain   [] Low back pain Hematologic:  [] Easy bruising  [] Easy bleeding   [] Hypercoagulable state   [] Anemic Gastrointestinal:  [] Diarrhea   [] Vomiting  [] Gastroesophageal reflux/heartburn   [] Difficulty swallowing. Genitourinary:  [] Chronic kidney disease   [] Difficult urination  [] Frequent urination   [] Blood in urine Skin:  [] Rashes   [] Ulcers  Psychological:  [] History of anxiety   []  History of major depression.  Physical Examination  There were no vitals filed for this visit. There is no height or weight on file to calculate BMI. Gen: WD/WN, NAD Head: /AT, No temporalis wasting.  Ear/Nose/Throat: Hearing grossly intact, nares w/o erythema or drainage Eyes: PER, EOMI, sclera nonicteric.  Neck: Supple, no masses.  No bruit or JVD.  Pulmonary:  Good air movement, no audible wheezing, no use of accessory muscles.  Cardiac: RRR, normal S1, S2, no Murmurs. Vascular:  mild trophic changes, no open wounds Vessel Right Left  Radial Palpable Palpable  PT Trace Palpable Trace Palpable  DP Trace Palpable Trace Palpable  Gastrointestinal: soft, non-distended. No guarding/no peritoneal signs.  Musculoskeletal: M/S 5/5 throughout.  No visible deformity.  Neurologic: CN 2-12 intact. Pain and light touch intact in extremities.  Symmetrical.  Speech is fluent. Motor exam as listed above. Psychiatric: Judgment intact, Mood & affect appropriate for pt's clinical situation. Dermatologic: No rashes or ulcers noted.  No changes consistent with cellulitis.   CBC Lab Results  Component Value Date   WBC 7.4 11/10/2020   HGB 10.1 (L) 11/10/2020   HCT 30.8 (L) 11/10/2020   MCV 88.3 11/10/2020   PLT 113 (L) 11/10/2020    BMET    Component Value Date/Time   NA 139  11/20/2020 1343   NA 136 08/30/2013 1025   K 4.4 11/20/2020 1343   K 3.7 08/30/2013 1025   CL 102 11/20/2020 1343   CL 101 08/30/2013 1025   CO2 24 11/20/2020 1343   CO2 31 08/30/2013 1025   GLUCOSE 214 (H) 11/20/2020 1343   GLUCOSE 130 (H) 11/10/2020 0448   GLUCOSE 310 (H) 08/30/2013 1025   BUN 50 (H) 11/20/2020 1343   BUN 17 08/30/2013 1025   CREATININE 2.52 (H) 11/20/2020 1343   CREATININE 1.15 08/30/2013 1025   CALCIUM 8.2 (L) 11/20/2020 1343   CALCIUM 9.4 08/30/2013 1025   GFRNONAA 21 (L) 11/10/2020 0448   GFRNONAA >60 08/30/2013  1025   GFRAA 27 (L) 02/22/2020 0539   GFRAA >60 08/30/2013 1025   CrCl cannot be calculated (Patient's most recent lab result is older than the maximum 21 days allowed.).  COAG Lab Results  Component Value Date   INR 1.2 09/23/2018   INR 1.3 (H) 09/22/2018   INR 1.13 03/11/2018    Radiology No results found.   Assessment/Plan 1. Atherosclerosis of native arteries of the extremities with ulceration ([place]) Recommend:  The patient is status post successful angiogram with intervention.  The patient reports that the claudication symptoms and leg pain has improved.   The ulcer is healed.  The patient denies lifestyle limiting changes at this point in time.  No further invasive studies, angiography or surgery at this time The patient should continue walking and begin a more formal exercise program.  The patient should continue antiplatelet therapy and aggressive treatment of the lipid abnormalities  Continued surveillance is indicated as atherosclerosis is likely to progress with time.    Patient should undergo noninvasive studies as ordered. The patient will follow up with me to review the studies.   - VAS [place] ABI WITH/WO TBI; Future  2. Coronary artery disease involving native coronary artery of native heart without angina pectoris Continue cardiac and antihypertensive medications as already ordered and reviewed, no changes at this  time.  Continue statin as ordered and reviewed, no changes at this time  Nitrates PRN for chest pain   3. Benign hypertension with CKD (chronic kidney disease) stage IV (HCC) Continue antihypertensive medications as already ordered, these medications have been reviewed and there are no changes at this time.   4. Type 2 diabetes mellitus with stage 4 chronic kidney disease, with long-term current use of insulin (HCC) Continue hypoglycemic medications as already ordered, these medications have been reviewed and there are no changes at this time.  Hgb A1C to be monitored as already arranged by primary service   5. CKD (chronic kidney disease) stage 4, GFR 15-29 ml/min (HCC) The patient has advanced renal disease.  However, at the present time the patient is not yet on dialysis.  Avoid nephrotoxic medications and dehydration.  Further plans per nephrology     Hortencia Pilar, MD  03/02/2022 4:08 PM

## 2022-03-04 ENCOUNTER — Encounter (INDEPENDENT_AMBULATORY_CARE_PROVIDER_SITE_OTHER): Payer: Self-pay | Admitting: Vascular Surgery

## 2022-03-04 ENCOUNTER — Ambulatory Visit (INDEPENDENT_AMBULATORY_CARE_PROVIDER_SITE_OTHER): Payer: PPO

## 2022-03-04 ENCOUNTER — Ambulatory Visit (INDEPENDENT_AMBULATORY_CARE_PROVIDER_SITE_OTHER): Payer: PPO | Admitting: Vascular Surgery

## 2022-03-04 VITALS — BP 109/64 | HR 71 | Resp 16 | Wt 201.0 lb

## 2022-03-04 DIAGNOSIS — I2510 Atherosclerotic heart disease of native coronary artery without angina pectoris: Secondary | ICD-10-CM

## 2022-03-04 DIAGNOSIS — I7025 Atherosclerosis of native arteries of other extremities with ulceration: Secondary | ICD-10-CM

## 2022-03-04 DIAGNOSIS — M79652 Pain in left thigh: Secondary | ICD-10-CM

## 2022-03-04 DIAGNOSIS — E1122 Type 2 diabetes mellitus with diabetic chronic kidney disease: Secondary | ICD-10-CM | POA: Diagnosis not present

## 2022-03-04 DIAGNOSIS — Z794 Long term (current) use of insulin: Secondary | ICD-10-CM

## 2022-03-04 DIAGNOSIS — N184 Chronic kidney disease, stage 4 (severe): Secondary | ICD-10-CM

## 2022-03-04 DIAGNOSIS — I129 Hypertensive chronic kidney disease with stage 1 through stage 4 chronic kidney disease, or unspecified chronic kidney disease: Secondary | ICD-10-CM | POA: Diagnosis not present

## 2022-03-04 DIAGNOSIS — Z9889 Other specified postprocedural states: Secondary | ICD-10-CM | POA: Diagnosis not present

## 2022-03-04 DIAGNOSIS — I739 Peripheral vascular disease, unspecified: Secondary | ICD-10-CM

## 2022-03-10 ENCOUNTER — Encounter (INDEPENDENT_AMBULATORY_CARE_PROVIDER_SITE_OTHER): Payer: Self-pay | Admitting: Vascular Surgery

## 2022-03-25 ENCOUNTER — Ambulatory Visit (INDEPENDENT_AMBULATORY_CARE_PROVIDER_SITE_OTHER): Payer: PPO | Admitting: Vascular Surgery

## 2022-04-12 ENCOUNTER — Ambulatory Visit: Payer: PPO | Attending: Cardiology | Admitting: Cardiology

## 2022-04-12 ENCOUNTER — Encounter: Payer: Self-pay | Admitting: Cardiology

## 2022-04-12 VITALS — BP 138/72 | HR 86 | Ht 72.0 in | Wt 207.6 lb

## 2022-04-12 DIAGNOSIS — I10 Essential (primary) hypertension: Secondary | ICD-10-CM

## 2022-04-12 DIAGNOSIS — I351 Nonrheumatic aortic (valve) insufficiency: Secondary | ICD-10-CM | POA: Diagnosis not present

## 2022-04-12 DIAGNOSIS — I350 Nonrheumatic aortic (valve) stenosis: Secondary | ICD-10-CM

## 2022-04-12 DIAGNOSIS — Z794 Long term (current) use of insulin: Secondary | ICD-10-CM

## 2022-04-12 DIAGNOSIS — I2510 Atherosclerotic heart disease of native coronary artery without angina pectoris: Secondary | ICD-10-CM

## 2022-04-12 DIAGNOSIS — E1122 Type 2 diabetes mellitus with diabetic chronic kidney disease: Secondary | ICD-10-CM

## 2022-04-12 DIAGNOSIS — G4730 Sleep apnea, unspecified: Secondary | ICD-10-CM

## 2022-04-12 DIAGNOSIS — N184 Chronic kidney disease, stage 4 (severe): Secondary | ICD-10-CM

## 2022-04-12 NOTE — Patient Instructions (Signed)
Medication Instructions:  Your physician recommends that you continue on your current medications as directed. Please refer to the Current Medication list given to you today.  *If you need a refill on your cardiac medications before your next appointment, please call your pharmacy*   Lab Work: None If you have labs (blood work) drawn today and your tests are completely normal, you will receive your results only by: [place] (if you have MyChart) OR A paper copy in the mail If you have any lab test that is abnormal or we need to change your treatment, we will call you to review the results.   Testing/Procedures: Your physician has requested that you have an echocardiogram. Echocardiography is a painless test that uses sound waves to create images of your heart. It provides your doctor with information about the size and shape of your heart and how well your heart's chambers and valves are working. This procedure takes approximately one hour. There are no restrictions for this procedure. Please do NOT wear cologne, perfume, aftershave, or lotions (deodorant is allowed). Please arrive 15 minutes prior to your appointment time.    Follow-Up: At [facility], you and your health needs are our priority.  As part of our continuing mission to provide you with exceptional heart care, we have created designated Provider Care Teams.  These Care Teams include your primary Cardiologist (physician) and Advanced Practice Providers (APPs -  Physician Assistants and Nurse Practitioners) who all work together to provide you with the care you need, when you need it.  We recommend signing up for the patient portal called "MyChart".  Sign up information is provided on this After Visit Summary.  MyChart is used to connect with patients for Virtual Visits (Telemedicine).  Patients are able to view lab/test results, encounter notes, upcoming appointments, etc.  Non-urgent messages can be sent to your  provider as well.   To learn more about what you can do with MyChart, go to [email or web address].    Your next appointment:   9 month(s)  The format for your next appointment:   In Person  Provider:   Jyl Heinz, MD    Other Instructions None  Important Information About Sugar

## 2022-04-12 NOTE — Progress Notes (Signed)
Cardiology Office Note:    Date:  04/12/2022   ID:  Phillip [name], DOB Jul 10, 1955, MRN [id number]  PCP:  Raina Mina., MD  Cardiologist:  Jenean Lindau, MD   Referring MD: Raina Mina., MD    ASSESSMENT:    1. Moderate aortic regurgitation   2. Essential hypertension   3. Coronary artery disease involving native coronary artery of native heart without angina pectoris   4. Aortic stenosis, moderate   5. Sleep apnea, unspecified type   6. Type 2 diabetes mellitus with stage 4 chronic kidney disease, with long-term current use of insulin (HCC)    PLAN:    In order of problems listed above:  Coronary artery disease: Secondary prevention stressed with the patient.  Importance of compliance with diet and medication stressed any vocalized understanding.  He was advised to ambulate to the best of his ability. Essential hypertension: Blood pressure stable and diet was emphasized. Mixed dyslipidemia: On lipid-lowering therapy.  Lipids followed by primary care. Moderate aortic stenosis and regurgitation: Echocardiogram will be done to assess follow-up on this.  Symptomatology discussed and he is asymptomatic.  I educated him about this. Advanced renal insufficiency: Followed by primary care and being monitored. Patient will be seen in follow-up appointment in 5months or earlier if the patient has any concerns    Medication Adjustments/Labs and Tests Ordered: Current medicines are reviewed at length with the patient today.  Concerns regarding medicines are outlined above.  Orders Placed This Encounter  Procedures   EKG 12-Lead   ECHOCARDIOGRAM COMPLETE   No orders of the defined types were placed in this encounter.    No chief complaint on file.    History of Present Illness:    Phillip [name] is a 66 y.o. male.  Patient has past medical history of coronary artery disease, essential hypertension, mixed dyslipidemia and advanced renal insufficiency.  He has  moderate aortic stenosis and regurgitation.  He denies any problems at this time and takes care of activities of daily living.  No chest pain orthopnea PND syncope or shortness of breath.  Overall he leads a sedentary lifestyle.  At the time of my evaluation, the patient is alert awake oriented and in no distress.  Past Medical History:  Diagnosis Date   Abdominopelvic abscess ([place]) 09/29/2018   Abscess of appendix 09/22/2018   Acquired spondylolisthesis of lumbosacral region 03/20/2020   Acute kidney injury ([place]) 10/07/2018   Acute kidney injury superimposed on CKD ([place]) 02/18/2020   Acute on chronic systolic (congestive) heart failure ([place]) 07/26/2015   Formatting of this note might be different from the original. Formatting of this note might be different from the original. Formatting of this note might be different from the original. revenkar EF 35% Formatting of this note might be different from the original. revenkar EF 35%   AKI (acute kidney injury) ([place]) 11/06/2020   Anemia of chronic disease 08/18/2019   Anemia, chronic disease 08/18/2019   Aortic stenosis, moderate 10/15/2019   Appendicitis 09/08/2018   ARF (acute renal failure) ([place]) 02/19/2020   Asthma    Atherosclerosis of native arteries of the extremities with ulceration ([place]) 02/24/2018   Formatting of this note might be different from the original. Last Assessment & Plan:  His ABIs today are 1.07 on the right and 0.99 on the left with biphasic waveforms.  Although these pressures may be somewhat elevated from medial calcification, his flow does appear to be reasonably good.  His left ABI  was 0.58 prior to intervention. At this point, we will stretch out his follow-up and see him b   Benign hypertension with CKD (chronic kidney disease) stage IV ([place]) 02/24/2018   Last Assessment & Plan:  blood pressure control important in reducing the progression of atherosclerotic disease. On appropriate oral medications.   Benign prostatic  hyperplasia without lower urinary tract symptoms 01/14/2018   Bilateral lower extremity edema 11/04/2018   Bradycardia 03/25/2018   Bruit of right carotid artery 07/26/2015   CAD (coronary artery disease) 07/26/2015   Formatting of this note might be different from the original. [name]  Formatting of this note might be different from the original. [name]   Cardiac murmur 07/26/2015   Chronic combined systolic and diastolic CHF (congestive heart failure) ([place]) 07/26/2015   Formatting of this note might be different from the original. revenkar EF 35%   Chronic idiopathic constipation 11/02/2018   Chronic low back pain without sciatica 12/02/2018   Chronic pain of right hip 69/62/9528   Chronic systolic heart failure ([place]) 07/26/2015   Formatting of this note might be different from the original. revenkar EF 35%   CKD (chronic kidney disease) stage 4, GFR 15-29 ml/min (HCC) 10/07/2018   Congestive heart failure ([place]) 07/26/2015   Last Assessment & Plan:  Poor cardiac function could certainly be contributing to poor blood supply to the feet and toes as well.  Formatting of this note might be different from the original. Formatting of this note might be different from the original. Last Assessment & Plan:  Poor cardiac function could certainly be contributing to poor blood supply to the feet and toes as well.  Last Assessmen   Coronary artery disease    Diabetes mellitus without complication ([place])    Dysphagia 07/26/2015   Encounter for long-term (current) use of insulin ([place]) 09/27/2020   Erectile dysfunction 07/14/2017   Essential hypertension 02/24/2018   Formatting of this note might be different from the original. Formatting of this note might be different from the original. Last Assessment & Plan: blood pressure control important in reducing the progression of atherosclerotic disease. On appropriate oral medications.   Fever 11/07/2020   Foot ulcer ([place])    left foot   GERD  (gastroesophageal reflux disease)    Heart murmur    Hematuria 07/27/2015   High risk medication use 07/26/2015   Hyperkalemia 05/29/2020   Hyperlipidemia 07/26/2015   Hyperlipidemia associated with type 2 diabetes mellitus ([place]) 02/18/2020   Hypertension    Hypertension associated with diabetes ([place])    Hypoglycemia 05/07/2019   Hypothyroidism 11/14/2015   Hypothyroidism (acquired) 11/14/2015   Insomnia 11/02/2018   Kidney insufficiency    Lumbar spondylosis 03/20/2020   Malaise and fatigue 03/25/2018   Mitral regurgitation 05/12/2019   Moderate aortic regurgitation 05/12/2019   Non-compliance 05/05/2019   NSTEMI (non-ST elevated myocardial infarction) ([place]) 09/27/2020   Osseous and subluxation stenosis of intervertebral foramina of lumbar region 03/20/2020   Peripheral vascular disease ([place])    Personal history of tobacco use, presenting hazards to health 09/27/2020   Polyneuropathy in diabetes ([place]) 05/07/2019   Primary osteoarthritis involving multiple joints 10/06/2019   Primary osteoarthritis of right hip 02/21/2020   Puncture wound of right hip 04/02/2018   PVD (peripheral vascular disease) with claudication ([name]) 07/26/2015   Angioplasty 02/2018   Regional wall motion abnormality of heart 05/07/2019   Right groin pain 02/18/2020   Screening for colon cancer 02/18/2018   Sleep apnea 07/26/2015   Subacute  osteomyelitis of left foot ([place]) 03/25/2018   Type 2 diabetes mellitus with stage 3 chronic kidney disease, with long-term current use of insulin ([place]) 07/26/2015   Type 2 diabetes mellitus with stage 4 chronic kidney disease, with long-term current use of insulin ([place]) 07/26/2015   Type II diabetes mellitus with neurological manifestations ([place]) 02/18/2020   Vitamin B12 deficiency 06/16/2019   Vitamin D deficiency 05/07/2018    Past Surgical History:  Procedure Laterality Date   AMPUTATION TOE Left 03/13/2018   Procedure: AMPUTATION TOE-MPJ;  Surgeon: Samara Deist, DPM;  Location: [facility] ORS;  Service: Podiatry;  Laterality: Left;   ANGIOPLASTY     CARDIAC CATHETERIZATION     CARDIAC SURGERY     heart bypass (4)   CORONARY ARTERY BYPASS GRAFT     LAPAROSCOPIC APPENDECTOMY N/A 09/08/2018   Procedure: APPENDECTOMY LAPAROSCOPIC;  Surgeon: Jules Husbands, MD;  Location: [facility] ORS;  Service: General;  Laterality: N/A;   LOWER EXTREMITY ANGIOGRAPHY Left 03/02/2018   Procedure: LOWER EXTREMITY ANGIOGRAPHY;  Surgeon: Algernon Huxley, MD;  Location: [facility];  Service: Cardiovascular;  Laterality: Left;   ROTATOR CUFF REPAIR Left     Current Medications: Current Meds  Medication Sig   acetaminophen (TYLENOL) 500 MG tablet Take 1,000 mg by mouth every 6 (six) hours as needed for moderate pain or headache.    bumetanide (BUMEX) 2 MG tablet Take 1 tablet (2 mg total) by mouth 2 (two) times daily.   carvedilol (COREG) 25 MG tablet Take 25 mg by mouth 2 (two) times daily.   cloNIDine (CATAPRES) 0.3 MG tablet Take 1 tablet (0.3 mg total) by mouth at bedtime.   clopidogrel (PLAVIX) 75 MG tablet Take 1 tablet (75 mg total) by mouth daily.   gabapentin (NEURONTIN) 600 MG tablet Take 300 mg by mouth 2 (two) times daily.   insulin detemir (LEVEMIR) 100 unit/ml SOLN Inject 10-20 Units into the skin at bedtime as needed for high blood sugar.   isosorbide mononitrate (IMDUR) 120 MG 24 hr tablet Take 1 tablet by mouth once daily   levothyroxine (SYNTHROID) 25 MCG tablet Take 25 mcg by mouth daily.    losartan (COZAAR) 25 MG tablet Take 1 tablet (25 mg total) by mouth daily.   lovastatin (MEVACOR) 40 MG tablet Take 20 mg by mouth at bedtime.   nitroGLYCERIN (NITROSTAT) 0.4 MG SL tablet Place 1 tablet (0.4 mg total) under the tongue every 5 (five) minutes as needed for chest pain.   tamsulosin (FLOMAX) 0.4 MG CAPS capsule Take 0.4 mg by mouth daily.   vitamin B-12 (CYANOCOBALAMIN) 500 MCG tablet Take 500 mcg by mouth daily.    Vitamin D, Ergocalciferol,  (DRISDOL) 1.25 MG (50000 UNIT) CAPS capsule Take 50,000 Units by mouth once a week.     Allergies:   Patient has no known allergies.   Social History   Socioeconomic History   Marital status: Married    Spouse name: Not on file   Number of children: Not on file   Years of education: Not on file   Highest education level: Not on file  Occupational History   Not on file  Tobacco Use   Smoking status: Former    Types: Cigarettes    Quit date: 2003    Years since quitting: 20.8   Smokeless tobacco: Never  Vaping Use   Vaping Use: Never used  Substance and Sexual Activity   Alcohol use: Not Currently    Comment: 2003  Drug use: Not Currently    Types: Marijuana   Sexual activity: Not on file  Other Topics Concern   Not on file  Social History Narrative   Not on file   Social Determinants of Health   Financial Resource Strain: Not on file  Food Insecurity: Not on file  Transportation Needs: Not on file  Physical Activity: Not on file  Stress: Not on file  Social Connections: Not on file     Family History: The patient's family history includes Diabetes in his mother; Heart attack in his brother; Heart disease in his brother; Hyperlipidemia in his mother; Hypertension in his mother; Lung disease in his father.  ROS:   Please see the history of present illness.    All other systems reviewed and are negative.  EKGs/Labs/Other Studies Reviewed:    The following studies were reviewed today: I discussed my findings with the patient at length.   Recent Labs: No results found for requested labs within last 365 days.  Recent Lipid Panel    Component Value Date/Time   CHOL 77 (L) 08/10/2019 1043   TRIG 65 08/10/2019 1043   HDL 25 (L) 08/10/2019 1043   CHOLHDL 3.1 08/10/2019 1043   LDLCALC 37 08/10/2019 1043    Physical Exam:    VS:  BP 138/72   Pulse 86   Ht 6' (1.829 m)   Wt 207 lb 9.6 oz (94.2 kg)   SpO2 96%   BMI 28.16 kg/m     Wt Readings from Last  3 Encounters:  04/12/22 207 lb 9.6 oz (94.2 kg)  03/04/22 201 lb (91.2 kg)  01/21/22 212 lb 12.8 oz (96.5 kg)     GEN: Patient is in no acute distress HEENT: Normal NECK: No JVD; No carotid bruits LYMPHATICS: No lymphadenopathy CARDIAC: Hear sounds regular, 2/6 systolic murmur at the apex and aortic area and diastolic murmur 2/6 in the aortic area also.Marland Kitchen RESPIRATORY:  Clear to auscultation without rales, wheezing or rhonchi  ABDOMEN: Soft, non-tender, non-distended MUSCULOSKELETAL:  No edema; No deformity  SKIN: Warm and dry NEUROLOGIC:  Alert and oriented x 3 PSYCHIATRIC:  Normal affect   Signed, Jenean Lindau, MD  04/12/2022 11:41 AM    [place]

## 2022-04-24 ENCOUNTER — Ambulatory Visit: Payer: PPO | Attending: Cardiology

## 2022-04-24 DIAGNOSIS — I351 Nonrheumatic aortic (valve) insufficiency: Secondary | ICD-10-CM

## 2022-04-24 DIAGNOSIS — I10 Essential (primary) hypertension: Secondary | ICD-10-CM

## 2022-04-26 LAB — ECHOCARDIOGRAM COMPLETE
AR max vel: 0.79 cm2
AV Area VTI: 1.04 cm2
AV Area mean vel: 0.79 cm2
AV Mean grad: 15 mmHg
AV Peak grad: 27.2 mmHg
Ao pk vel: 2.61 m/s
Area-P 1/2: 4.68 cm2
MV M vel: 4.18 m/s
MV Peak grad: 69.9 mmHg
P 1/2 time: 383 msec
Radius: 0.3 cm
S' Lateral: 5.6 cm

## 2022-04-29 ENCOUNTER — Telehealth: Payer: Self-pay

## 2022-04-29 NOTE — Telephone Encounter (Signed)
-----   Message from Jenean Lindau, MD sent at 04/29/2022 11:49 AM EST ----- Moderately depressed ejection fraction.  Continue current medical management.  Moderate aortic stenosis.  Copy primary care. Jenean Lindau, MD 04/29/2022 11:49 AM

## 2022-06-01 ENCOUNTER — Other Ambulatory Visit: Payer: Self-pay | Admitting: Cardiology

## 2022-06-01 DIAGNOSIS — I2510 Atherosclerotic heart disease of native coronary artery without angina pectoris: Secondary | ICD-10-CM

## 2022-06-04 NOTE — Telephone Encounter (Signed)
Rx refill sent to pharmacy. 

## 2022-06-15 ENCOUNTER — Other Ambulatory Visit: Payer: Self-pay | Admitting: Cardiology

## 2022-06-15 DIAGNOSIS — I10 Essential (primary) hypertension: Secondary | ICD-10-CM

## 2022-09-03 ENCOUNTER — Encounter (INDEPENDENT_AMBULATORY_CARE_PROVIDER_SITE_OTHER): Payer: PPO

## 2022-09-03 ENCOUNTER — Ambulatory Visit (INDEPENDENT_AMBULATORY_CARE_PROVIDER_SITE_OTHER): Payer: PPO | Admitting: Nurse Practitioner

## 2022-09-10 ENCOUNTER — Other Ambulatory Visit (INDEPENDENT_AMBULATORY_CARE_PROVIDER_SITE_OTHER): Payer: Self-pay | Admitting: Vascular Surgery

## 2022-09-10 DIAGNOSIS — I7025 Atherosclerosis of native arteries of other extremities with ulceration: Secondary | ICD-10-CM

## 2022-09-19 ENCOUNTER — Ambulatory Visit (INDEPENDENT_AMBULATORY_CARE_PROVIDER_SITE_OTHER): Payer: PPO

## 2022-09-19 ENCOUNTER — Ambulatory Visit (INDEPENDENT_AMBULATORY_CARE_PROVIDER_SITE_OTHER): Payer: PPO | Admitting: Nurse Practitioner

## 2022-09-19 DIAGNOSIS — I7025 Atherosclerosis of native arteries of other extremities with ulceration: Secondary | ICD-10-CM | POA: Diagnosis not present

## 2022-09-23 ENCOUNTER — Encounter (INDEPENDENT_AMBULATORY_CARE_PROVIDER_SITE_OTHER): Payer: Self-pay | Admitting: Nurse Practitioner

## 2022-09-23 ENCOUNTER — Ambulatory Visit (INDEPENDENT_AMBULATORY_CARE_PROVIDER_SITE_OTHER): Payer: PPO | Admitting: Nurse Practitioner

## 2022-09-23 VITALS — BP 177/98 | HR 83 | Resp 17 | Ht 72.0 in | Wt 202.8 lb

## 2022-09-23 DIAGNOSIS — E1122 Type 2 diabetes mellitus with diabetic chronic kidney disease: Secondary | ICD-10-CM | POA: Diagnosis not present

## 2022-09-23 DIAGNOSIS — E1159 Type 2 diabetes mellitus with other circulatory complications: Secondary | ICD-10-CM | POA: Diagnosis not present

## 2022-09-23 DIAGNOSIS — I152 Hypertension secondary to endocrine disorders: Secondary | ICD-10-CM

## 2022-09-23 DIAGNOSIS — Z794 Long term (current) use of insulin: Secondary | ICD-10-CM

## 2022-09-23 DIAGNOSIS — N184 Chronic kidney disease, stage 4 (severe): Secondary | ICD-10-CM

## 2022-09-23 DIAGNOSIS — I7025 Atherosclerosis of native arteries of other extremities with ulceration: Secondary | ICD-10-CM | POA: Diagnosis not present

## 2022-09-23 NOTE — Progress Notes (Signed)
Subjective:    Patient ID: Phillip [name], male    DOB: [birth year], 67 y.o.   MRN: [id number] Chief Complaint  Patient presents with   Follow-up    The patient returns to the office for followup and review of the noninvasive studies.   There have been no interval changes in lower extremity symptoms. No interval shortening of the patient's claudication distance or development of rest pain symptoms.  He has an ulcer on his left second toe with exposed bone.  In evaluating notes from his podiatrist, they feel that they will likely need to amputate a portion of the toe.  There have been no significant changes to the patient's overall health care.  The patient denies amaurosis fugax or recent TIA symptoms. There are no documented recent neurological changes noted. There is no history of DVT, PE or superficial thrombophlebitis. The patient denies recent episodes of angina or shortness of breath.   ABIs are noncompressible bilaterally.  Amputation of left great toe but the right has an ABI 0.50.  The patient has monophasic tibial artery waveforms bilaterally.    Review of Systems  Skin:  Positive for wound.  All other systems reviewed and are negative.      Objective:   Physical Exam Vitals reviewed.  HENT:     Head: Normocephalic.  Cardiovascular:     Rate and Rhythm: Normal rate.     Pulses:          Dorsalis pedis pulses are detected w/ Doppler on the left side.       Posterior tibial pulses are detected w/ Doppler on the left side.  Pulmonary:     Effort: Pulmonary effort is normal.  Skin:    General: Skin is warm and dry.  Neurological:     Mental Status: He is alert and oriented to person, place, and time.  Psychiatric:        Mood and Affect: Mood normal.        Behavior: Behavior normal.        Thought Content: Thought content normal.        Judgment: Judgment normal.     BP (!) 177/98 (BP Location: Right Arm)   Pulse 83   Resp 17   Ht 6' (1.829 m)   Wt  202 lb 12.8 oz (92 kg)   BMI 27.50 kg/m   Past Medical History:  Diagnosis Date   Abdominopelvic abscess 09/29/2018   Abscess of appendix 09/22/2018   Acquired spondylolisthesis of lumbosacral region 03/20/2020   Acute kidney injury 10/07/2018   Acute kidney injury superimposed on CKD 02/18/2020   Acute on chronic systolic (congestive) heart failure 07/26/2015   Formatting of this note might be different from the original. Formatting of this note might be different from the original. Formatting of this note might be different from the original. revenkar EF 35% Formatting of this note might be different from the original. revenkar EF 35%   AKI (acute kidney injury) 11/06/2020   Anemia of chronic disease 08/18/2019   Anemia, chronic disease 08/18/2019   Aortic stenosis, moderate 10/15/2019   Appendicitis 09/08/2018   ARF (acute renal failure) 02/19/2020   Asthma    Atherosclerosis of native arteries of the extremities with ulceration 02/24/2018   Formatting of this note might be different from the original. Last Assessment & Plan:  His ABIs today are 1.07 on the right and 0.99 on the left with biphasic waveforms.  Although these pressures may be somewhat  elevated from medial calcification, his flow does appear to be reasonably good.  His left ABI was 0.58 prior to intervention. At this point, we will stretch out his follow-up and see him b   Benign hypertension with CKD (chronic kidney disease) stage IV 02/24/2018   Last Assessment & Plan:  blood pressure control important in reducing the progression of atherosclerotic disease. On appropriate oral medications.   Benign prostatic hyperplasia without lower urinary tract symptoms 01/14/2018   Bilateral lower extremity edema 11/04/2018   Bradycardia 03/25/2018   Bruit of right carotid artery 07/26/2015   CAD (coronary artery disease) 07/26/2015   Formatting of this note might be different from the original. [name]  Formatting of this note  might be different from the original. [name]   Cardiac murmur 07/26/2015   Chronic combined systolic and diastolic CHF (congestive heart failure) 07/26/2015   Formatting of this note might be different from the original. revenkar EF 35%   Chronic idiopathic constipation 11/02/2018   Chronic low back pain without sciatica 12/02/2018   Chronic pain of right hip 03/31/2020   Chronic systolic heart failure 07/26/2015   Formatting of this note might be different from the original. revenkar EF 35%   CKD (chronic kidney disease) stage 4, GFR 15-29 ml/min 10/07/2018   Congestive heart failure 07/26/2015   Last Assessment & Plan:  Poor cardiac function could certainly be contributing to poor blood supply to the feet and toes as well.  Formatting of this note might be different from the original. Formatting of this note might be different from the original. Last Assessment & Plan:  Poor cardiac function could certainly be contributing to poor blood supply to the feet and toes as well.  Last Assessmen   Coronary artery disease    Diabetes mellitus without complication    Dysphagia 07/26/2015   Encounter for long-term (current) use of insulin 09/27/2020   Erectile dysfunction 07/14/2017   Essential hypertension 02/24/2018   Formatting of this note might be different from the original. Formatting of this note might be different from the original. Last Assessment & Plan: blood pressure control important in reducing the progression of atherosclerotic disease. On appropriate oral medications.   Fever 11/07/2020   Foot ulcer    left foot   GERD (gastroesophageal reflux disease)    Heart murmur    Hematuria 07/27/2015   High risk medication use 07/26/2015   Hyperkalemia 05/29/2020   Hyperlipidemia 07/26/2015   Hyperlipidemia associated with type 2 diabetes mellitus 02/18/2020   Hypertension    Hypertension associated with diabetes    Hypoglycemia 05/07/2019   Hypothyroidism 11/14/2015    Hypothyroidism (acquired) 11/14/2015   Insomnia 11/02/2018   Kidney insufficiency    Lumbar spondylosis 03/20/2020   Malaise and fatigue 03/25/2018   Mitral regurgitation 05/12/2019   Moderate aortic regurgitation 05/12/2019   Non-compliance 05/05/2019   NSTEMI (non-ST elevated myocardial infarction) 09/27/2020   Osseous and subluxation stenosis of intervertebral foramina of lumbar region 03/20/2020   Peripheral vascular disease    Personal history of tobacco use, presenting hazards to health 09/27/2020   Polyneuropathy in diabetes 05/07/2019   Primary osteoarthritis involving multiple joints 10/06/2019   Primary osteoarthritis of right hip 02/21/2020   Puncture wound of right hip 04/02/2018   PVD (peripheral vascular disease) with claudication 07/26/2015   Angioplasty 02/2018   Regional wall motion abnormality of heart 05/07/2019   Right groin pain 02/18/2020   Screening for colon cancer 02/18/2018   Sleep apnea 07/26/2015  Subacute osteomyelitis of left foot 03/25/2018   Type 2 diabetes mellitus with stage 3 chronic kidney disease, with long-term current use of insulin 07/26/2015   Type 2 diabetes mellitus with stage 4 chronic kidney disease, with long-term current use of insulin 07/26/2015   Type II diabetes mellitus with neurological manifestations 02/18/2020   Vitamin B12 deficiency 06/16/2019   Vitamin D deficiency 05/07/2018    Social History   Socioeconomic History   Marital status: Married    Spouse name: Not on file   Number of children: Not on file   Years of education: Not on file   Highest education level: Not on file  Occupational History   Not on file  Tobacco Use   Smoking status: Former    Types: Cigarettes    Quit date: 2003    Years since quitting: 21.3   Smokeless tobacco: Never  Vaping Use   Vaping Use: Never used  Substance and Sexual Activity   Alcohol use: Not Currently    Comment: 2003   Drug use: Not Currently    Types: Marijuana   Sexual  activity: Not on file  Other Topics Concern   Not on file  Social History Narrative   Not on file   Social Determinants of Health   Financial Resource Strain: Not on file  Food Insecurity: Not on file  Transportation Needs: Not on file  Physical Activity: Not on file  Stress: Not on file  Social Connections: Not on file  Intimate Partner Violence: Not on file    Past Surgical History:  Procedure Laterality Date   AMPUTATION TOE Left 03/13/2018   Procedure: AMPUTATION TOE-MPJ;  Surgeon: Gwyneth Revels, DPM;  Location: [facility];  Service: Podiatry;  Laterality: Left;   ANGIOPLASTY     CARDIAC CATHETERIZATION     CARDIAC SURGERY     heart bypass (4)   CORONARY ARTERY BYPASS GRAFT     LAPAROSCOPIC APPENDECTOMY N/A 09/08/2018   Procedure: APPENDECTOMY LAPAROSCOPIC;  Surgeon: Leafy Ro, MD;  Location: [facility];  Service: General;  Laterality: N/A;   LOWER EXTREMITY ANGIOGRAPHY Left 03/02/2018   Procedure: LOWER EXTREMITY ANGIOGRAPHY;  Surgeon: Annice Needy, MD;  Location: [facility];  Service: Cardiovascular;  Laterality: Left;   ROTATOR CUFF REPAIR Left     Family History  Problem Relation Age of Onset   Hyperlipidemia Mother    Hypertension Mother    Diabetes Mother    Heart attack Brother    Heart disease Brother    Lung disease Father     No Known Allergies     Latest Ref Rng & Units 11/10/2020    4:48 AM 11/09/2020    5:44 AM 11/08/2020    5:37 AM  CBC  WBC 4.0 - 10.5 K/uL 7.4  13.0  18.3   Hemoglobin 13.0 - 17.0 g/dL 16.1  09.6  04.5   Hematocrit 39.0 - 52.0 % 30.8  34.0  38.8   Platelets 150 - 400 K/uL 113  114  122       CMP     Component Value Date/Time   NA 139 11/20/2020 1343   NA 136 08/30/2013 1025   K 4.4 11/20/2020 1343   K 3.7 08/30/2013 1025   CL 102 11/20/2020 1343   CL 101 08/30/2013 1025   CO2 24 11/20/2020 1343   CO2 31 08/30/2013 1025   GLUCOSE 214 (H) 11/20/2020 1343   GLUCOSE 130 (H) 11/10/2020 0448   GLUCOSE 310 (  H)  08/30/2013 1025   BUN 50 (H) 11/20/2020 1343   BUN 17 08/30/2013 1025   CREATININE 2.52 (H) 11/20/2020 1343   CREATININE 1.15 08/30/2013 1025   CALCIUM 8.2 (L) 11/20/2020 1343   CALCIUM 9.4 08/30/2013 1025   PROT 7.2 11/07/2020 0653   PROT 7.2 08/10/2019 1043   ALBUMIN 3.2 (L) 11/07/2020 0653   ALBUMIN 3.9 08/10/2019 1043   AST 15 11/07/2020 0653   ALT 11 11/07/2020 0653   ALKPHOS 61 11/07/2020 0653   BILITOT 1.4 (H) 11/07/2020 0653   BILITOT 0.7 08/10/2019 1043   GFRNONAA 21 (L) 11/10/2020 0448   GFRNONAA >60 08/30/2013 1025   GFRAA 27 (L) 02/22/2020 0539   GFRAA >60 08/30/2013 1025     No results found.     Assessment & Plan:   1. Atherosclerosis of native arteries of the extremities with ulceration  Recommend:  The patient has evidence of severe atherosclerotic changes of both lower extremities associated with ulceration and tissue loss of the left foot.  This represents a limb threatening ischemia and places the patient at the risk for left limb loss.  Patient should undergo angiography of the left lower extremity with the hope for intervention for limb salvage.  The risks and benefits as well as the alternative therapies was discussed in detail with the patient.  All questions were answered.  Patient agrees to proceed with left angiography.  The patient will follow up with me in the office after the procedure.   2. Hypertension associated with diabetes Continue antihypertensive medications as already ordered, these medications have been reviewed and there are no changes at this time.  3. Type 2 diabetes mellitus with stage 4 chronic kidney disease, with long-term current use of insulin Continue hypoglycemic medications as already ordered, these medications have been reviewed and there are no changes at this time.  Hgb A1C to be monitored as already arranged by primary service    Current Outpatient Medications on File Prior to Visit  Medication Sig Dispense Refill    acetaminophen (TYLENOL) 500 MG tablet Take 1,000 mg by mouth every 6 (six) hours as needed for moderate pain or headache.      Blood Glucose Monitoring Suppl (TGT BLOOD GLUCOSE MONITORING) w/Device KIT 3 (three) times daily.     bumetanide (BUMEX) 2 MG tablet Take 1 tablet (2 mg total) by mouth 2 (two) times daily.     carvedilol (COREG) 25 MG tablet Take 25 mg by mouth 2 (two) times daily.     cloNIDine (CATAPRES) 0.3 MG tablet Take 1 tablet (0.3 mg total) by mouth at bedtime. 90 tablet 2   clopidogrel (PLAVIX) 75 MG tablet Take 1 tablet (75 mg total) by mouth daily. 90 tablet 3   doxycycline (VIBRA-TABS) 100 MG tablet Take 100 mg by mouth 2 (two) times daily.     gabapentin (NEURONTIN) 600 MG tablet Take 300 mg by mouth 2 (two) times daily.     insulin detemir (LEVEMIR) 100 unit/ml SOLN Inject 10-20 Units into the skin at bedtime as needed for high blood sugar.     isosorbide mononitrate (IMDUR) 120 MG 24 hr tablet Take 1 tablet (120 mg total) by mouth daily. 90 tablet 3   Lancets MISC 1 each by miscellaneous route 2 (two) times a day.     levothyroxine (SYNTHROID) 25 MCG tablet Take 25 mcg by mouth daily.      losartan (COZAAR) 25 MG tablet Take 1 tablet (25 mg total) by mouth  daily.     lovastatin (MEVACOR) 40 MG tablet Take 20 mg by mouth at bedtime.     nitroGLYCERIN (NITROSTAT) 0.4 MG SL tablet Place 1 tablet (0.4 mg total) under the tongue every 5 (five) minutes as needed for chest pain. 25 tablet 6   tamsulosin (FLOMAX) 0.4 MG CAPS capsule Take 0.4 mg by mouth daily.     vitamin B-12 (CYANOCOBALAMIN) 500 MCG tablet Take 500 mcg by mouth daily.      Vitamin D, Ergocalciferol, (DRISDOL) 1.25 MG (50000 UNIT) CAPS capsule Take 50,000 Units by mouth once a week.     No current facility-administered medications on file prior to visit.    There are no Patient Instructions on file for this visit. No follow-ups on file.   Georgiana Spinner, NP

## 2022-09-23 NOTE — H&P (View-Only) (Signed)
 Subjective:    Patient ID: Phillip [name], male    DOB: 10/13/1955, 66 y.o.   MRN: [id number] Chief Complaint  Patient presents with   Follow-up    The patient returns to the office for followup and review of the noninvasive studies.   There have been no interval changes in lower extremity symptoms. No interval shortening of the patient's claudication distance or development of rest pain symptoms.  He has an ulcer on his left second toe with exposed bone.  In evaluating notes from his podiatrist, they feel that they will likely need to amputate a portion of the toe.  There have been no significant changes to the patient's overall health care.  The patient denies amaurosis fugax or recent TIA symptoms. There are no documented recent neurological changes noted. There is no history of DVT, PE or superficial thrombophlebitis. The patient denies recent episodes of angina or shortness of breath.   ABIs are noncompressible bilaterally.  Amputation of left great toe but the right has an ABI 0.50.  The patient has monophasic tibial artery waveforms bilaterally.    Review of Systems  Skin:  Positive for wound.  All other systems reviewed and are negative.      Objective:   Physical Exam Vitals reviewed.  HENT:     Head: Normocephalic.  Cardiovascular:     Rate and Rhythm: Normal rate.     Pulses:          Dorsalis pedis pulses are detected w/ Doppler on the left side.       Posterior tibial pulses are detected w/ Doppler on the left side.  Pulmonary:     Effort: Pulmonary effort is normal.  Skin:    General: Skin is warm and dry.  Neurological:     Mental Status: He is alert and oriented to person, place, and time.  Psychiatric:        Mood and Affect: Mood normal.        Behavior: Behavior normal.        Thought Content: Thought content normal.        Judgment: Judgment normal.     BP (!) 177/98 (BP Location: Right Arm)   Pulse 83   Resp 17   Ht 6' (1.829 m)   Wt  202 lb 12.8 oz (92 kg)   BMI 27.50 kg/m   Past Medical History:  Diagnosis Date   Abdominopelvic abscess 09/29/2018   Abscess of appendix 09/22/2018   Acquired spondylolisthesis of lumbosacral region 03/20/2020   Acute kidney injury 10/07/2018   Acute kidney injury superimposed on CKD 02/18/2020   Acute on chronic systolic (congestive) heart failure 07/26/2015   Formatting of this note might be different from the original. Formatting of this note might be different from the original. Formatting of this note might be different from the original. revenkar EF 35% Formatting of this note might be different from the original. revenkar EF 35%   AKI (acute kidney injury) 11/06/2020   Anemia of chronic disease 08/18/2019   Anemia, chronic disease 08/18/2019   Aortic stenosis, moderate 10/15/2019   Appendicitis 09/08/2018   ARF (acute renal failure) 02/19/2020   Asthma    Atherosclerosis of native arteries of the extremities with ulceration 02/24/2018   Formatting of this note might be different from the original. Last Assessment & Plan:  His ABIs today are 1.07 on the right and 0.99 on the left with biphasic waveforms.  Although these pressures may be somewhat   elevated from medial calcification, his flow does appear to be reasonably good.  His left ABI was 0.58 prior to intervention. At this point, we will stretch out his follow-up and see him b   Benign hypertension with CKD (chronic kidney disease) stage IV 02/24/2018   Last Assessment & Plan:  blood pressure control important in reducing the progression of atherosclerotic disease. On appropriate oral medications.   Benign prostatic hyperplasia without lower urinary tract symptoms 01/14/2018   Bilateral lower extremity edema 11/04/2018   Bradycardia 03/25/2018   Bruit of right carotid artery 07/26/2015   CAD (coronary artery disease) 07/26/2015   Formatting of this note might be different from the original. [name]  Formatting of this note  might be different from the original. [name]   Cardiac murmur 07/26/2015   Chronic combined systolic and diastolic CHF (congestive heart failure) 07/26/2015   Formatting of this note might be different from the original. revenkar EF 35%   Chronic idiopathic constipation 11/02/2018   Chronic low back pain without sciatica 12/02/2018   Chronic pain of right hip 03/31/2020   Chronic systolic heart failure 07/26/2015   Formatting of this note might be different from the original. revenkar EF 35%   CKD (chronic kidney disease) stage 4, GFR 15-29 ml/min 10/07/2018   Congestive heart failure 07/26/2015   Last Assessment & Plan:  Poor cardiac function could certainly be contributing to poor blood supply to the feet and toes as well.  Formatting of this note might be different from the original. Formatting of this note might be different from the original. Last Assessment & Plan:  Poor cardiac function could certainly be contributing to poor blood supply to the feet and toes as well.  Last Assessmen   Coronary artery disease    Diabetes mellitus without complication    Dysphagia 07/26/2015   Encounter for long-term (current) use of insulin 09/27/2020   Erectile dysfunction 07/14/2017   Essential hypertension 02/24/2018   Formatting of this note might be different from the original. Formatting of this note might be different from the original. Last Assessment & Plan: blood pressure control important in reducing the progression of atherosclerotic disease. On appropriate oral medications.   Fever 11/07/2020   Foot ulcer    left foot   GERD (gastroesophageal reflux disease)    Heart murmur    Hematuria 07/27/2015   High risk medication use 07/26/2015   Hyperkalemia 05/29/2020   Hyperlipidemia 07/26/2015   Hyperlipidemia associated with type 2 diabetes mellitus 02/18/2020   Hypertension    Hypertension associated with diabetes    Hypoglycemia 05/07/2019   Hypothyroidism 11/14/2015    Hypothyroidism (acquired) 11/14/2015   Insomnia 11/02/2018   Kidney insufficiency    Lumbar spondylosis 03/20/2020   Malaise and fatigue 03/25/2018   Mitral regurgitation 05/12/2019   Moderate aortic regurgitation 05/12/2019   Non-compliance 05/05/2019   NSTEMI (non-ST elevated myocardial infarction) 09/27/2020   Osseous and subluxation stenosis of intervertebral foramina of lumbar region 03/20/2020   Peripheral vascular disease    Personal history of tobacco use, presenting hazards to health 09/27/2020   Polyneuropathy in diabetes 05/07/2019   Primary osteoarthritis involving multiple joints 10/06/2019   Primary osteoarthritis of right hip 02/21/2020   Puncture wound of right hip 04/02/2018   PVD (peripheral vascular disease) with claudication 07/26/2015   Angioplasty 02/2018   Regional wall motion abnormality of heart 05/07/2019   Right groin pain 02/18/2020   Screening for colon cancer 02/18/2018   Sleep apnea 07/26/2015     Subacute osteomyelitis of left foot 03/25/2018   Type 2 diabetes mellitus with stage 3 chronic kidney disease, with long-term current use of insulin 07/26/2015   Type 2 diabetes mellitus with stage 4 chronic kidney disease, with long-term current use of insulin 07/26/2015   Type II diabetes mellitus with neurological manifestations 02/18/2020   Vitamin B12 deficiency 06/16/2019   Vitamin D deficiency 05/07/2018    Social History   Socioeconomic History   Marital status: Married    Spouse name: Not on file   Number of children: Not on file   Years of education: Not on file   Highest education level: Not on file  Occupational History   Not on file  Tobacco Use   Smoking status: Former    Types: Cigarettes    Quit date: 2003    Years since quitting: 21.3   Smokeless tobacco: Never  Vaping Use   Vaping Use: Never used  Substance and Sexual Activity   Alcohol use: Not Currently    Comment: 2003   Drug use: Not Currently    Types: Marijuana   Sexual  activity: Not on file  Other Topics Concern   Not on file  Social History Narrative   Not on file   Social Determinants of Health   Financial Resource Strain: Not on file  Food Insecurity: Not on file  Transportation Needs: Not on file  Physical Activity: Not on file  Stress: Not on file  Social Connections: Not on file  Intimate Partner Violence: Not on file    Past Surgical History:  Procedure Laterality Date   AMPUTATION TOE Left 03/13/2018   Procedure: AMPUTATION TOE-MPJ;  Surgeon: Fowler, Justin, DPM;  Location: [facility];  Service: Podiatry;  Laterality: Left;   ANGIOPLASTY     CARDIAC CATHETERIZATION     CARDIAC SURGERY     heart bypass (4)   CORONARY ARTERY BYPASS GRAFT     LAPAROSCOPIC APPENDECTOMY N/A 09/08/2018   Procedure: APPENDECTOMY LAPAROSCOPIC;  Surgeon: Pabon, Diego F, MD;  Location: [facility];  Service: General;  Laterality: N/A;   LOWER EXTREMITY ANGIOGRAPHY Left 03/02/2018   Procedure: LOWER EXTREMITY ANGIOGRAPHY;  Surgeon: Dew, Jason S, MD;  Location: [facility];  Service: Cardiovascular;  Laterality: Left;   ROTATOR CUFF REPAIR Left     Family History  Problem Relation Age of Onset   Hyperlipidemia Mother    Hypertension Mother    Diabetes Mother    Heart attack Brother    Heart disease Brother    Lung disease Father     No Known Allergies     Latest Ref Rng & Units 11/10/2020    4:48 AM 11/09/2020    5:44 AM 11/08/2020    5:37 AM  CBC  WBC 4.0 - 10.5 K/uL 7.4  13.0  18.3   Hemoglobin 13.0 - 17.0 g/dL 10.1  10.9  12.6   Hematocrit 39.0 - 52.0 % 30.8  34.0  38.8   Platelets 150 - 400 K/uL 113  114  122       CMP     Component Value Date/Time   NA 139 11/20/2020 1343   NA 136 08/30/2013 1025   K 4.4 11/20/2020 1343   K 3.7 08/30/2013 1025   CL 102 11/20/2020 1343   CL 101 08/30/2013 1025   CO2 24 11/20/2020 1343   CO2 31 08/30/2013 1025   GLUCOSE 214 (H) 11/20/2020 1343   GLUCOSE 130 (H) 11/10/2020 0448   GLUCOSE 310 (  H)  08/30/2013 1025   BUN 50 (H) 11/20/2020 1343   BUN 17 08/30/2013 1025   CREATININE 2.52 (H) 11/20/2020 1343   CREATININE 1.15 08/30/2013 1025   CALCIUM 8.2 (L) 11/20/2020 1343   CALCIUM 9.4 08/30/2013 1025   PROT 7.2 11/07/2020 0653   PROT 7.2 08/10/2019 1043   ALBUMIN 3.2 (L) 11/07/2020 0653   ALBUMIN 3.9 08/10/2019 1043   AST 15 11/07/2020 0653   ALT 11 11/07/2020 0653   ALKPHOS 61 11/07/2020 0653   BILITOT 1.4 (H) 11/07/2020 0653   BILITOT 0.7 08/10/2019 1043   GFRNONAA 21 (L) 11/10/2020 0448   GFRNONAA >60 08/30/2013 1025   GFRAA 27 (L) 02/22/2020 0539   GFRAA >60 08/30/2013 1025     No results found.     Assessment & Plan:   1. Atherosclerosis of native arteries of the extremities with ulceration  Recommend:  The patient has evidence of severe atherosclerotic changes of both lower extremities associated with ulceration and tissue loss of the left foot.  This represents a limb threatening ischemia and places the patient at the risk for left limb loss.  Patient should undergo angiography of the left lower extremity with the hope for intervention for limb salvage.  The risks and benefits as well as the alternative therapies was discussed in detail with the patient.  All questions were answered.  Patient agrees to proceed with left angiography.  The patient will follow up with me in the office after the procedure.   2. Hypertension associated with diabetes Continue antihypertensive medications as already ordered, these medications have been reviewed and there are no changes at this time.  3. Type 2 diabetes mellitus with stage 4 chronic kidney disease, with long-term current use of insulin Continue hypoglycemic medications as already ordered, these medications have been reviewed and there are no changes at this time.  Hgb A1C to be monitored as already arranged by primary service    Current Outpatient Medications on File Prior to Visit  Medication Sig Dispense Refill    acetaminophen (TYLENOL) 500 MG tablet Take 1,000 mg by mouth every 6 (six) hours as needed for moderate pain or headache.      Blood Glucose Monitoring Suppl (TGT BLOOD GLUCOSE MONITORING) w/Device KIT 3 (three) times daily.     bumetanide (BUMEX) 2 MG tablet Take 1 tablet (2 mg total) by mouth 2 (two) times daily.     carvedilol (COREG) 25 MG tablet Take 25 mg by mouth 2 (two) times daily.     cloNIDine (CATAPRES) 0.3 MG tablet Take 1 tablet (0.3 mg total) by mouth at bedtime. 90 tablet 2   clopidogrel (PLAVIX) 75 MG tablet Take 1 tablet (75 mg total) by mouth daily. 90 tablet 3   doxycycline (VIBRA-TABS) 100 MG tablet Take 100 mg by mouth 2 (two) times daily.     gabapentin (NEURONTIN) 600 MG tablet Take 300 mg by mouth 2 (two) times daily.     insulin detemir (LEVEMIR) 100 unit/ml SOLN Inject 10-20 Units into the skin at bedtime as needed for high blood sugar.     isosorbide mononitrate (IMDUR) 120 MG 24 hr tablet Take 1 tablet (120 mg total) by mouth daily. 90 tablet 3   Lancets MISC 1 each by miscellaneous route 2 (two) times a day.     levothyroxine (SYNTHROID) 25 MCG tablet Take 25 mcg by mouth daily.      losartan (COZAAR) 25 MG tablet Take 1 tablet (25 mg total) by mouth   daily.     lovastatin (MEVACOR) 40 MG tablet Take 20 mg by mouth at bedtime.     nitroGLYCERIN (NITROSTAT) 0.4 MG SL tablet Place 1 tablet (0.4 mg total) under the tongue every 5 (five) minutes as needed for chest pain. 25 tablet 6   tamsulosin (FLOMAX) 0.4 MG CAPS capsule Take 0.4 mg by mouth daily.     vitamin B-12 (CYANOCOBALAMIN) 500 MCG tablet Take 500 mcg by mouth daily.      Vitamin D, Ergocalciferol, (DRISDOL) 1.25 MG (50000 UNIT) CAPS capsule Take 50,000 Units by mouth once a week.     No current facility-administered medications on file prior to visit.    There are no Patient Instructions on file for this visit. No follow-ups on file.   Sumaiyah Markert E Leslie Jester, NP   

## 2022-09-24 ENCOUNTER — Telehealth (INDEPENDENT_AMBULATORY_CARE_PROVIDER_SITE_OTHER): Payer: Self-pay

## 2022-09-24 NOTE — Telephone Encounter (Signed)
Reach out to the patient to schedule left le angio with Dr Wyn Quaker. I spoke with patient spouse and she will have the patient return my call to office.

## 2022-09-25 ENCOUNTER — Encounter (INDEPENDENT_AMBULATORY_CARE_PROVIDER_SITE_OTHER): Payer: Self-pay

## 2022-09-25 NOTE — Telephone Encounter (Signed)
Patient has been schedule for left lower extremity angio with Dr Wyn Quaker for 09/30/2022 [facility]pm [facility]. Pre-procedure instructions were gone over with patient and letter will be mailed out.

## 2022-09-30 ENCOUNTER — Encounter: Payer: Self-pay | Admitting: Vascular Surgery

## 2022-09-30 ENCOUNTER — Ambulatory Visit
Admission: RE | Admit: 2022-09-30 | Discharge: 2022-09-30 | Disposition: A | Discharge status: Home / Self Care | Payer: PPO | Attending: Vascular Surgery | Admitting: Vascular Surgery

## 2022-09-30 ENCOUNTER — Encounter
Admission: RE | Disposition: A | Discharge status: Home / Self Care | Payer: Self-pay | Source: Home / Self Care | Attending: Vascular Surgery

## 2022-09-30 ENCOUNTER — Other Ambulatory Visit: Payer: Self-pay

## 2022-09-30 DIAGNOSIS — E11621 Type 2 diabetes mellitus with foot ulcer: Secondary | ICD-10-CM | POA: Diagnosis not present

## 2022-09-30 DIAGNOSIS — Z794 Long term (current) use of insulin: Secondary | ICD-10-CM | POA: Diagnosis not present

## 2022-09-30 DIAGNOSIS — E1122 Type 2 diabetes mellitus with diabetic chronic kidney disease: Secondary | ICD-10-CM | POA: Diagnosis not present

## 2022-09-30 DIAGNOSIS — I70299 Other atherosclerosis of native arteries of extremities, unspecified extremity: Secondary | ICD-10-CM

## 2022-09-30 DIAGNOSIS — I129 Hypertensive chronic kidney disease with stage 1 through stage 4 chronic kidney disease, or unspecified chronic kidney disease: Secondary | ICD-10-CM | POA: Insufficient documentation

## 2022-09-30 DIAGNOSIS — N184 Chronic kidney disease, stage 4 (severe): Secondary | ICD-10-CM | POA: Insufficient documentation

## 2022-09-30 DIAGNOSIS — L97909 Non-pressure chronic ulcer of unspecified part of unspecified lower leg with unspecified severity: Secondary | ICD-10-CM

## 2022-09-30 DIAGNOSIS — L97522 Non-pressure chronic ulcer of other part of left foot with fat layer exposed: Secondary | ICD-10-CM | POA: Insufficient documentation

## 2022-09-30 DIAGNOSIS — L97529 Non-pressure chronic ulcer of other part of left foot with unspecified severity: Secondary | ICD-10-CM | POA: Diagnosis not present

## 2022-09-30 DIAGNOSIS — I70245 Atherosclerosis of native arteries of left leg with ulceration of other part of foot: Secondary | ICD-10-CM | POA: Diagnosis not present

## 2022-09-30 DIAGNOSIS — E1151 Type 2 diabetes mellitus with diabetic peripheral angiopathy without gangrene: Secondary | ICD-10-CM | POA: Insufficient documentation

## 2022-09-30 HISTORY — PX: LOWER EXTREMITY ANGIOGRAPHY: CATH118251

## 2022-09-30 LAB — CREATININE, SERUM
Creatinine, Ser: 3.99 mg/dL — ABNORMAL HIGH (ref 0.61–1.24)
GFR, Estimated: 16 mL/min — ABNORMAL LOW (ref >60)

## 2022-09-30 LAB — VAS US ABI WITH/WO TBI

## 2022-09-30 LAB — GLUCOSE, CAPILLARY: Glucose-Capillary: 131 mg/dL — ABNORMAL HIGH (ref 70–99)

## 2022-09-30 LAB — BUN: BUN: 95 mg/dL — ABNORMAL HIGH (ref 8–23)

## 2022-09-30 SURGERY — LOWER EXTREMITY ANGIOGRAPHY
Anesthesia: Moderate Sedation | Site: Leg Lower | Laterality: Left

## 2022-09-30 MED ORDER — ONDANSETRON HCL 4 MG/2ML IJ SOLN: 4.0000 mg | Freq: Four times a day (QID) | INTRAMUSCULAR | Status: DC | PRN

## 2022-09-30 MED ORDER — SODIUM CHLORIDE 0.9 % IV SOLN: 250.0000 mL | INTRAVENOUS | Status: DC | PRN

## 2022-09-30 MED ORDER — HEPARIN SODIUM (PORCINE) 1000 UNIT/ML IJ SOLN
INTRAMUSCULAR | Status: DC | PRN
  Administered 2022-09-30: 5000 [IU] via INTRAVENOUS

## 2022-09-30 MED ORDER — SODIUM CHLORIDE 0.9% FLUSH: 3.0000 mL | INTRAVENOUS | Status: DC | PRN

## 2022-09-30 MED ORDER — CEFAZOLIN SODIUM-DEXTROSE 2-4 GM/100ML-% IV SOLN
2.0000 g | INTRAVENOUS | Status: AC
  Administered 2022-09-30: 2 g via INTRAVENOUS

## 2022-09-30 MED ORDER — ASPIRIN 81 MG PO TBEC: 81.0000 mg | DELAYED_RELEASE_TABLET | Freq: Every day | ORAL | Status: DC

## 2022-09-30 MED ORDER — HYDROMORPHONE HCL 1 MG/ML IJ SOLN: 1.0000 mg | Freq: Once | INTRAMUSCULAR | Status: DC | PRN

## 2022-09-30 MED ORDER — HEPARIN SODIUM (PORCINE) 1000 UNIT/ML IJ SOLN
INTRAMUSCULAR | Status: AC
  Filled 2022-09-30: qty 10

## 2022-09-30 MED ORDER — FENTANYL CITRATE (PF) 100 MCG/2ML IJ SOLN
INTRAMUSCULAR | Status: AC
  Filled 2022-09-30: qty 2

## 2022-09-30 MED ORDER — CEFAZOLIN SODIUM-DEXTROSE 2-4 GM/100ML-% IV SOLN
INTRAVENOUS | Status: AC
  Filled 2022-09-30: qty 100

## 2022-09-30 MED ORDER — ASPIRIN 81 MG PO TBEC
DELAYED_RELEASE_TABLET | ORAL | Status: AC
  Administered 2022-09-30: 81 mg via ORAL
  Filled 2022-09-30: qty 1

## 2022-09-30 MED ORDER — ACETAMINOPHEN 325 MG PO TABS: 650.0000 mg | ORAL_TABLET | ORAL | Status: DC | PRN

## 2022-09-30 MED ORDER — SODIUM CHLORIDE 0.9% FLUSH: 3.0000 mL | Freq: Two times a day (BID) | INTRAVENOUS | Status: DC

## 2022-09-30 MED ORDER — SODIUM CHLORIDE 0.9 % IV SOLN: INTRAVENOUS | Status: DC

## 2022-09-30 MED ORDER — FENTANYL CITRATE (PF) 100 MCG/2ML IJ SOLN
INTRAMUSCULAR | Status: DC | PRN
  Administered 2022-09-30: 50 ug via INTRAVENOUS

## 2022-09-30 MED ORDER — MIDAZOLAM HCL 5 MG/5ML IJ SOLN
INTRAMUSCULAR | Status: AC
  Filled 2022-09-30: qty 5

## 2022-09-30 MED ORDER — MIDAZOLAM HCL 2 MG/ML PO SYRP: 8.0000 mg | ORAL_SOLUTION | Freq: Once | ORAL | Status: DC | PRN

## 2022-09-30 MED ORDER — DIPHENHYDRAMINE HCL 50 MG/ML IJ SOLN: 50.0000 mg | Freq: Once | INTRAMUSCULAR | Status: DC | PRN

## 2022-09-30 MED ORDER — METHYLPREDNISOLONE SODIUM SUCC 125 MG IJ SOLR: 125.0000 mg | Freq: Once | INTRAMUSCULAR | Status: DC | PRN

## 2022-09-30 MED ORDER — SODIUM CHLORIDE 0.9 % IV BOLUS
INTRAVENOUS | Status: DC | PRN
  Administered 2022-09-30: 250 mL via INTRAVENOUS

## 2022-09-30 MED ORDER — FAMOTIDINE 20 MG PO TABS: 40.0000 mg | ORAL_TABLET | Freq: Once | ORAL | Status: DC | PRN

## 2022-09-30 MED ORDER — ASPIRIN 81 MG PO TBEC: 81.0000 mg | DELAYED_RELEASE_TABLET | Freq: Every day | ORAL | 2 refills | Status: DC

## 2022-09-30 MED ORDER — HYDRALAZINE HCL 20 MG/ML IJ SOLN: 5.0000 mg | INTRAMUSCULAR | Status: DC | PRN

## 2022-09-30 MED ORDER — MIDAZOLAM HCL 2 MG/2ML IJ SOLN
INTRAMUSCULAR | Status: DC | PRN
  Administered 2022-09-30: 2 mg via INTRAVENOUS

## 2022-09-30 MED ORDER — LABETALOL HCL 5 MG/ML IV SOLN: 10.0000 mg | INTRAVENOUS | Status: DC | PRN

## 2022-09-30 SURGICAL SUPPLY — 17 items
BALLN LUTONIX DCB 4X60X130 (BALLOONS) ×1
BALLN LUTONIX DCB 6X80X130 (BALLOONS) ×1
BALLOON LUTONIX DCB 4X60X130 (BALLOONS) IMPLANT
BALLOON LUTONIX DCB 6X80X130 (BALLOONS) IMPLANT
CATH ANGIO 5F PIGTAIL 65CM (CATHETERS) IMPLANT
CATH NAVICROSS ANGLED 135CM (MICROCATHETER) IMPLANT
CATH SIZING 5F 100CM .035 PIG (CATHETERS) IMPLANT
COVER PROBE ULTRASOUND 5X96 (MISCELLANEOUS) IMPLANT
DEVICE STARCLOSE SE CLOSURE (Vascular Products) IMPLANT
GLIDEWIRE ADV .035X260CM (WIRE) IMPLANT
KIT ENCORE 26 ADVANTAGE (KITS) IMPLANT
PACK ANGIOGRAPHY (CUSTOM PROCEDURE TRAY) ×1 IMPLANT
SHEATH BRITE TIP 5FRX11 (SHEATH) IMPLANT
SHEATH RAABE 6FRX70 (SHEATH) IMPLANT
SYR MEDRAD MARK 7 150ML (SYRINGE) IMPLANT
TUBING CONTRAST HIGH PRESS 72 (TUBING) IMPLANT
WIRE GUIDERIGHT .035X150 (WIRE) IMPLANT

## 2022-09-30 NOTE — Op Note (Signed)
[facility]  Percutaneous Study/Intervention Procedural Note   Date of Surgery: 09/30/2022  Surgeon(s):Junita Kubota    Assistants:none  Pre-operative Diagnosis: PAD with ulceration left lower extremity  Post-operative diagnosis:  Same  Procedure(s) Performed:             1.  Ultrasound guidance for vascular access right femoral artery             2.  Catheter placement into left common femoral artery from right femoral approach             3.  Aortogram and selective left lower extremity angiogram             4.  Percutaneous transluminal angioplasty of left tibioperoneal trunk and most proximal peroneal artery with 4 mm diameter by 6 cm length Lutonix drug-coated angioplasty balloon             5.  Percutaneous transluminal angioplasty of left popliteal artery with 6 mm diameter by 8 cm length Lutonix drug-coated angioplasty balloon  6.  StarClose closure device right femoral artery  EBL: 5 cc  Contrast: 45 cc  Fluoro Time: 5.8 minutes  Moderate Conscious Sedation Time: approximately 49 minutes using 2 mg of Versed and 50 mcg of Fentanyl              Indications:  Patient is a 66 y.o.male with nonhealing ulceration of the left foot. The patient has noninvasive study showing reduced perfusion in the left foot and toes. The patient is brought in for angiography for further evaluation and potential treatment.  Due to the limb threatening nature of the situation, angiogram was performed for attempted limb salvage. The patient is aware that if the procedure fails, amputation would be expected.  The patient also understands that even with successful revascularization, amputation may still be required due to the severity of the situation.  Risks and benefits are discussed and informed consent is obtained.   Procedure:  The patient was identified and appropriate procedural time out was performed.  The patient was then placed supine on the table and prepped and draped in  the usual sterile fashion. Moderate conscious sedation was administered during a face to face encounter with the patient throughout the procedure with my supervision of the RN administering medicines and monitoring the patient's vital signs, pulse oximetry, telemetry and mental status throughout from the start of the procedure until the patient was taken to the recovery room. Ultrasound was used to evaluate the right common femoral artery.  It was patent .  A digital ultrasound image was acquired.  A Seldinger needle was used to access the right common femoral artery under direct ultrasound guidance and a permanent image was performed.  A 0.035 J wire was advanced without resistance and a 5Fr sheath was placed.  Pigtail catheter was placed into the aorta and an AP aortogram was performed. This demonstrated normal renal arteries and normal aorta and iliac segments without significant stenosis. I then crossed the aortic bifurcation and advanced to the left femoral head. Selective left lower extremity angiogram was then performed. This demonstrated fairly normal common femoral artery, profunda femoris artery, and superficial femoral artery.  His vessels were quite large.  The popliteal artery in the midsegment had about a 60 to 70% stenosis.  The below-knee popliteal artery normalized.  There was then a high-grade stenosis in the tibioperoneal trunk with the peroneal artery being the predominant runoff distally although it occluded at the level of the  ankle.  The posterior tibial artery was patent proximally but occluded in the proximal to mid segment and did not reconstitute distally.  The anterior tibial artery was occluded from its origin with reconstitution through collaterals of the dorsalis pedis artery at the ankle.  The distal tibial flow was highly diseased and the filling of the foot was sluggish. It was felt that it was in the patient's best interest to proceed with intervention after these images to avoid a  second procedure and a larger amount of contrast and fluoroscopy based off of the findings from the initial angiogram. The patient was systemically heparinized and a 6 French 70 cm sheath was then placed over the Terumo Advantage wire. I then used a Kumpe catheter and the advantage wire to navigate down through both the popliteal lesion and the tibioperoneal trunk lesion parking the wire in the mid to distal peroneal artery.  I then proceeded with treatment.  A 4 mm diameter by 6 cm length Lutonix drug-coated angioplasty balloon was inflated in the tibioperoneal trunk and most proximal peroneal artery and inflated up to 10 atm for 1 minute.  A 6 mm diameter by 8 cm length Lutonix drug-coated angioplasty balloon was inflated to treat the popliteal stenosis.  Was taken up to 8 atm for 1 minute.  Completion imaging showed about a 25 to 30% residual stenosis in both locations after treatment.  There is no distal reconstitution of the posterior tibial artery to attempt revascularization of this vessel in the anterior tibial artery was occluded at its origin all the way down to the foot and not going to be easily amenable to any intervention.  He has a large peroneal artery and hopefully collaterals from this peroneal artery will provide enough runoff to the foot for wound healing. I elected to terminate the procedure. The sheath was removed and StarClose closure device was deployed in the right femoral artery with excellent hemostatic result. The patient was taken to the recovery room in stable condition having tolerated the procedure well.  Findings:               Aortogram:  This demonstrated normal renal arteries and normal aorta and iliac segments without significant stenosis.             Left Lower Extremity:  This demonstrated fairly normal common femoral artery, profunda femoris artery, and superficial femoral artery.  His vessels were quite large.  The popliteal artery in the midsegment had about a 60 to 70%  stenosis.  The below-knee popliteal artery normalized.  There was then a high-grade stenosis in the tibioperoneal trunk with the peroneal artery being the predominant runoff distally although it occluded at the level of the ankle.  The posterior tibial artery was patent proximally but occluded in the proximal to mid segment and did not reconstitute distally.  The anterior tibial artery was occluded from its origin with reconstitution through collaterals of the dorsalis pedis artery at the ankle.  The distal tibial flow was highly diseased and the filling of the foot was sluggish.   Disposition: Patient was taken to the recovery room in stable condition having tolerated the procedure well.  Complications: None  Phillip [name] 09/30/2022 2:30 PM   This note was created with Dragon Medical transcription system. Any errors in dictation are purely unintentional.

## 2022-09-30 NOTE — Interval H&P Note (Signed)
History and Physical Interval Note:  09/30/2022 11:21 AM  Phillip [name]  has presented today for surgery, with the diagnosis of LLE Angio   BARD   ASO w ulceration.  The various methods of treatment have been discussed with the patient and family. After consideration of risks, benefits and other options for treatment, the patient has consented to  Procedure(s): Lower Extremity Angiography (Left) as a surgical intervention.  The patient's history has been reviewed, patient examined, no change in status, stable for surgery.  I have reviewed the patient's chart and labs.  Questions were answered to the patient's satisfaction.     Festus Barren

## 2022-09-30 NOTE — Progress Notes (Signed)
New bandage on R groin noted to have new blood. Vascular lab called and made aware of situation, tech will come out to inject with Epi with Lido. VSS

## 2022-09-30 NOTE — Progress Notes (Signed)
Patient with oozing at the right femoral site. Original bandage removed and pressure held over the site until bleeding stopped. New bandage applied and monitoring of site to begin for duration of stay.

## 2022-10-01 ENCOUNTER — Encounter: Payer: Self-pay | Admitting: Vascular Surgery

## 2022-10-08 ENCOUNTER — Telehealth: Payer: Self-pay | Admitting: *Deleted

## 2022-10-08 ENCOUNTER — Encounter: Payer: Self-pay | Admitting: Urgent Care

## 2022-10-08 ENCOUNTER — Telehealth: Payer: Self-pay

## 2022-10-08 NOTE — Telephone Encounter (Signed)
   Pre-operative Risk Assessment    Patient Name: Phillip [name]  DOB: 12-09-1955 MRN: [id number]      Request for Surgical Clearance    Procedure:   partial amputation of left second toe  Date of Surgery:  Clearance 10/11/22                                 Surgeon:  Dr. Gwyneth Revels Surgeon's Group or Practice Name:  [facility] Phone number:  [phone] Fax number:  [phone]   Type of Clearance Requested:   - Medical    Type of Anesthesia:  Not Indicated   Additional requests/questions:    SignedDione Housekeeper   10/08/2022, 2:54 PM

## 2022-10-08 NOTE — Telephone Encounter (Signed)
   Name: Phillip [name]  DOB: [birth year]  MRN: [id number]  Primary Cardiologist: None   Preoperative team, please contact this patient and set up a phone call appointment for further preoperative risk assessment. Please obtain consent and complete medication review. Thank you for your help.  I confirm that guidance regarding antiplatelet and oral anticoagulation therapy has been completed and, if necessary, noted below.  None requested.    Carlos Levering, NP 10/08/2022, 5:15 PM  [facility]

## 2022-10-08 NOTE — Telephone Encounter (Signed)
I s/w the pt and he has been scheduled for tele pre op appt 10/10/22 10:40. We only received the clearance today 10/08/22 for surgery 10/11/22. Pt is on ASA and Plavix though was not noted if needing to be held. I did also ask the pt did the surgeon tel him that he needed to hold ASA and Plavix and the pt said no the surgeon did not tell him he had to hold any medications. Med rec and consent are done. Had to put pt at 10:40 as when I went to schedule in the 9:40 time slot it popped up that the pt has another appt at that time. I will update all parties involved.

## 2022-10-08 NOTE — Telephone Encounter (Signed)
I s/w the pt and he has been scheduled for tele pre op appt 10/10/22 10:40. We only received the clearance today 10/08/22 for surgery 10/11/22. Pt is on ASA and Plavix though was not noted if needing to be held. I did also ask the pt did the surgeon tel him that he needed to hold ASA and Plavix and the pt said no the surgeon did not tell him he had to hold any medications. Med rec and consent are done. Had to put pt at 10:40 as when I went to schedule in the 9:40 time slot it popped up that the pt has another appt at that time. I will update all parties involved.      Patient Consent for Virtual Visit        Phillip [name] has provided verbal consent on 10/08/2022 for a virtual visit (video or telephone).   CONSENT FOR VIRTUAL VISIT FOR:  Phillip [name]  By participating in this virtual visit I agree to the following:  I hereby voluntarily request, consent and authorize [facility] and its employed or contracted physicians, physician assistants, nurse practitioners or other licensed health care professionals (the Practitioner), to provide me with telemedicine health care services (the "Services") as deemed necessary by the treating Practitioner. I acknowledge and consent to receive the Services by the Practitioner via telemedicine. I understand that the telemedicine visit will involve communicating with the Practitioner through live audiovisual communication technology and the disclosure of certain medical information by electronic transmission. I acknowledge that I have been given the opportunity to request an in-person assessment or other available alternative prior to the telemedicine visit and am voluntarily participating in the telemedicine visit.  I understand that I have the right to withhold or withdraw my consent to the use of telemedicine in the course of my care at any time, without affecting my right to future care or treatment, and that the Practitioner or I may terminate the  telemedicine visit at any time. I understand that I have the right to inspect all information obtained and/or recorded in the course of the telemedicine visit and may receive copies of available information for a reasonable fee.  I understand that some of the potential risks of receiving the Services via telemedicine include:  Delay or interruption in medical evaluation due to technological equipment failure or disruption; Information transmitted may not be sufficient (e.g. poor resolution of images) to allow for appropriate medical decision making by the Practitioner; and/or  In rare instances, security protocols could fail, causing a breach of personal health information.  Furthermore, I acknowledge that it is my responsibility to provide information about my medical history, conditions and care that is complete and accurate to the best of my ability. I acknowledge that Practitioner's advice, recommendations, and/or decision may be based on factors not within their control, such as incomplete or inaccurate data provided by me or distortions of diagnostic images or specimens that may result from electronic transmissions. I understand that the practice of medicine is not an exact science and that Practitioner makes no warranties or guarantees regarding treatment outcomes. I acknowledge that a copy of this consent can be made available to me via my patient portal [facility] MyChart), or I can request a printed copy by calling the office of [facility].    I understand that my insurance will be billed for this visit.   I have read or had this consent read to me. I understand the contents  of this consent, which adequately explains the benefits and risks of the Services being provided via telemedicine.  I have been provided ample opportunity to ask questions regarding this consent and the Services and have had my questions answered to my satisfaction. I give my informed consent for the services  to be provided through the use of telemedicine in my medical care

## 2022-10-10 ENCOUNTER — Inpatient Hospital Stay: Admission: RE | Admit: 2022-10-10 | Payer: PPO | Source: Ambulatory Visit

## 2022-10-10 ENCOUNTER — Ambulatory Visit: Payer: PPO | Attending: Internal Medicine

## 2022-10-10 DIAGNOSIS — Z01810 Encounter for preprocedural cardiovascular examination: Secondary | ICD-10-CM

## 2022-10-10 NOTE — Progress Notes (Signed)
Virtual Visit via Telephone Note   Because of Phillip [name]'s co-morbid illnesses, he is at least at moderate risk for complications without adequate follow up.  This format is felt to be most appropriate for this patient at this time.  The patient did not have access to video technology/had technical difficulties with video requiring transitioning to audio format only (telephone).  All issues noted in this document were discussed and addressed.  No physical exam could be performed with this format.  Please refer to the patient's chart for his consent to telehealth for [facility].  Evaluation Performed:  Preoperative cardiovascular risk assessment _____________   Date:  10/10/2022   Patient ID:  Phillip [name], DOB 19-Feb-1956, MRN [id number] Patient Location:  Home Provider location:   Office  Primary Care Provider:  Gordan [name]., MD Primary Cardiologist:  None  Chief Complaint / Patient Profile   67 y.o. y/o male with a h/o moderate aortic regurgitation, hypertension, CAD, moderate aortic stenosis, sleep apnea, type 2 diabetes mellitus with stage IV chronic kidney disease who is pending partial amputation of left second toe and presents today for telephonic preoperative cardiovascular risk assessment.  History of Present Illness    Phillip [name] is a 67 y.o. male who presents via audio/video conferencing for a telehealth visit today.  Pt was last seen in cardiology clinic on 04/12/2022 by Dr. Tomie China.  At that time Phillip [name] was doing well.  The patient is now pending procedure as outlined above. Since his last visit, he tells me he is not having any chest pain or shortness of breath.  He does have pain in his calves which hold him back.  He was told that this was caused by quotation hardening of the arteries in his legs".  He has a lot of tightness in his calf.  He does not have stairs in his home and probably could not do a flight of stairs.  He did use to  go using a riding mower but has not done that lately.  He is scored a 4.06 on the DASI.  This does exceed the 4 METS minimum requirement.  He is high risk for upcoming procedure due to his history with coronary disease, diabetes, and kidney disease.  We reviewed his echocardiogram from November which showed LVEF 35 to 40%, moderate aortic regurgitation and mild to moderate aortic stenosis.  Medical management was recommended.  Past Medical History    Past Medical History:  Diagnosis Date   Abdominopelvic abscess (HCC) 09/29/2018   Abscess of appendix 09/22/2018   Acquired spondylolisthesis of lumbosacral region 03/20/2020   Acute kidney injury (HCC) 10/07/2018   Acute kidney injury superimposed on CKD (HCC) 02/18/2020   Acute on chronic systolic (congestive) heart failure (HCC) 07/26/2015   Formatting of this note might be different from the original. Formatting of this note might be different from the original. Formatting of this note might be different from the original. revenkar EF 35% Formatting of this note might be different from the original. revenkar EF 35%   AKI (acute kidney injury) (HCC) 11/06/2020   Anemia of chronic disease 08/18/2019   Anemia, chronic disease 08/18/2019   Aortic stenosis, moderate 10/15/2019   Appendicitis 09/08/2018   ARF (acute renal failure) (HCC) 02/19/2020   Asthma    Atherosclerosis of native arteries of the extremities with ulceration (HCC) 02/24/2018   Formatting of this note might be different from the original. Last Assessment & Plan:  His  ABIs today are 1.07 on the right and 0.99 on the left with biphasic waveforms.  Although these pressures may be somewhat elevated from medial calcification, his flow does appear to be reasonably good.  His left ABI was 0.58 prior to intervention. At this point, we will stretch out his follow-up and see him b   Benign hypertension with CKD (chronic kidney disease) stage IV (HCC) 02/24/2018   Last Assessment & Plan:   blood pressure control important in reducing the progression of atherosclerotic disease. On appropriate oral medications.   Benign prostatic hyperplasia without lower urinary tract symptoms 01/14/2018   Bilateral lower extremity edema 11/04/2018   Bradycardia 03/25/2018   Bruit of right carotid artery 07/26/2015   CAD (coronary artery disease) 07/26/2015   Formatting of this note might be different from the original. [name]  Formatting of this note might be different from the original. [name]   Cardiac murmur 07/26/2015   Chronic combined systolic and diastolic CHF (congestive heart failure) (HCC) 07/26/2015   Formatting of this note might be different from the original. revenkar EF 35%   Chronic idiopathic constipation 11/02/2018   Chronic low back pain without sciatica 12/02/2018   Chronic pain of right hip 03/31/2020   Chronic systolic heart failure (HCC) 07/26/2015   Formatting of this note might be different from the original. revenkar EF 35%   CKD (chronic kidney disease) stage 4, GFR 15-29 ml/min (HCC) 10/07/2018   Congestive heart failure (HCC) 07/26/2015   Last Assessment & Plan:  Poor cardiac function could certainly be contributing to poor blood supply to the feet and toes as well.  Formatting of this note might be different from the original. Formatting of this note might be different from the original. Last Assessment & Plan:  Poor cardiac function could certainly be contributing to poor blood supply to the feet and toes as well.  Last Assessmen   Coronary artery disease    Diabetes mellitus without complication (HCC)    Dysphagia 07/26/2015   Encounter for long-term (current) use of insulin (HCC) 09/27/2020   Erectile dysfunction 07/14/2017   Essential hypertension 02/24/2018   Formatting of this note might be different from the original. Formatting of this note might be different from the original. Last Assessment & Plan: blood pressure control important in reducing the  progression of atherosclerotic disease. On appropriate oral medications.   Fever 11/07/2020   Foot ulcer (HCC)    left foot   GERD (gastroesophageal reflux disease)    Heart murmur    Hematuria 07/27/2015   High risk medication use 07/26/2015   Hyperkalemia 05/29/2020   Hyperlipidemia 07/26/2015   Hyperlipidemia associated with type 2 diabetes mellitus (HCC) 02/18/2020   Hypertension    Hypertension associated with diabetes (HCC)    Hypoglycemia 05/07/2019   Hypothyroidism 11/14/2015   Hypothyroidism (acquired) 11/14/2015   Insomnia 11/02/2018   Kidney insufficiency    Lumbar spondylosis 03/20/2020   Malaise and fatigue 03/25/2018   Mitral regurgitation 05/12/2019   Moderate aortic regurgitation 05/12/2019   Non-compliance 05/05/2019   NSTEMI (non-ST elevated myocardial infarction) (HCC) 09/27/2020   Osseous and subluxation stenosis of intervertebral foramina of lumbar region 03/20/2020   Peripheral vascular disease (HCC)    Personal history of tobacco use, presenting hazards to health 09/27/2020   Polyneuropathy in diabetes (HCC) 05/07/2019   Primary osteoarthritis involving multiple joints 10/06/2019   Primary osteoarthritis of right hip 02/21/2020   Puncture wound of right hip 04/02/2018   PVD (peripheral vascular disease)  with claudication (HCC) 07/26/2015   Angioplasty 02/2018   Regional wall motion abnormality of heart 05/07/2019   Right groin pain 02/18/2020   Screening for colon cancer 02/18/2018   Sleep apnea 07/26/2015   Subacute osteomyelitis of left foot (HCC) 03/25/2018   Type 2 diabetes mellitus with stage 3 chronic kidney disease, with long-term current use of insulin (HCC) 07/26/2015   Type 2 diabetes mellitus with stage 4 chronic kidney disease, with long-term current use of insulin (HCC) 07/26/2015   Type II diabetes mellitus with neurological manifestations (HCC) 02/18/2020   Vitamin B12 deficiency 06/16/2019   Vitamin D deficiency 05/07/2018   Past  Surgical History:  Procedure Laterality Date   AMPUTATION TOE Left 03/13/2018   Procedure: AMPUTATION TOE-MPJ;  Surgeon: Gwyneth Revels, DPM;  Location: [facility] ORS;  Service: Podiatry;  Laterality: Left;   ANGIOPLASTY     CARDIAC CATHETERIZATION     CARDIAC SURGERY     heart bypass (4)   CORONARY ARTERY BYPASS GRAFT     LAPAROSCOPIC APPENDECTOMY N/A 09/08/2018   Procedure: APPENDECTOMY LAPAROSCOPIC;  Surgeon: Leafy Ro, MD;  Location: [facility] ORS;  Service: General;  Laterality: N/A;   LOWER EXTREMITY ANGIOGRAPHY Left 03/02/2018   Procedure: LOWER EXTREMITY ANGIOGRAPHY;  Surgeon: Annice Needy, MD;  Location: [facility] INVASIVE [facility];  Service: Cardiovascular;  Laterality: Left;   LOWER EXTREMITY ANGIOGRAPHY Left 09/30/2022   Procedure: Lower Extremity Angiography;  Surgeon: Annice Needy, MD;  Location: [facility] CV [facility];  Service: Cardiovascular;  Laterality: Left;   ROTATOR CUFF REPAIR Left     Allergies  No Known Allergies  Home Medications    Prior to Admission medications   Medication Sig Start Date End Date Taking? Authorizing Provider  acetaminophen (TYLENOL) 500 MG tablet Take 1,000 mg by mouth every 6 (six) hours as needed for moderate pain or headache.     [provider]  aspirin EC 81 MG tablet Take 1 tablet (81 mg total) by mouth daily. Swallow whole. 09/30/22 09/30/23  Annice Needy, MD  Blood Glucose Monitoring Suppl (TGT BLOOD GLUCOSE MONITORING) w/Device KIT 3 (three) times daily. 09/20/21   [provider]  bumetanide (BUMEX) 2 MG tablet Take 1 tablet (2 mg total) by mouth 2 (two) times daily. 11/17/20   Rodolph Bong, MD  carvedilol (COREG) 25 MG tablet Take 25 mg by mouth 2 (two) times daily. Patient not taking: Reported on 09/30/2022 07/13/21   [provider]  cloNIDine (CATAPRES) 0.3 MG tablet Take 1 tablet (0.3 mg total) by mouth at bedtime. 06/17/22   Revankar, Aundra Dubin, MD  clopidogrel (PLAVIX) 75 MG tablet Take 1 tablet (75 mg total) by  mouth daily. 11/20/20   Revankar, Aundra Dubin, MD  doxycycline (VIBRA-TABS) 100 MG tablet Take 100 mg by mouth 2 (two) times daily. Patient not taking: Reported on 09/30/2022 09/10/22   [provider]  gabapentin (NEURONTIN) 600 MG tablet Take 300 mg by mouth 2 (two) times daily.    [provider]  insulin detemir (LEVEMIR) 100 unit/ml SOLN Inject 10-20 Units into the skin at bedtime as needed for high blood sugar. Took half dose    [provider]  isosorbide mononitrate (IMDUR) 120 MG 24 hr tablet Take 1 tablet (120 mg total) by mouth daily. 06/04/22   Revankar, Aundra Dubin, MD  Lancets MISC 1 each by miscellaneous route 2 (two) times a day. 09/20/21   [provider]  levothyroxine (SYNTHROID) 25 MCG tablet Take 25  mcg by mouth daily.  10/09/18   [provider]  losartan (COZAAR) 25 MG tablet Take 1 tablet (25 mg total) by mouth daily. 11/15/20   Rodolph Bong, MD  lovastatin (MEVACOR) 40 MG tablet Take 20 mg by mouth at bedtime. 07/13/21   [provider]  nitroGLYCERIN (NITROSTAT) 0.4 MG SL tablet Place 1 tablet (0.4 mg total) under the tongue every 5 (five) minutes as needed for chest pain. 11/20/20   Revankar, Aundra Dubin, MD  tamsulosin (FLOMAX) 0.4 MG CAPS capsule Take 0.4 mg by mouth daily. 04/19/20   [provider]  vitamin B-12 (CYANOCOBALAMIN) 500 MCG tablet Take 500 mcg by mouth daily.     [provider]  Vitamin D, Ergocalciferol, (DRISDOL) 1.25 MG (50000 UNIT) CAPS capsule Take 50,000 Units by mouth once a week. 07/13/21   [provider]    Physical Exam    Vital Signs:  Phillip [name] does not have vital signs available for review today.  Given telephonic nature of communication, physical exam is limited. AAOx3. NAD. Normal affect.  Speech and respirations are unlabored.  Accessory Clinical Findings    None  Assessment & Plan    1.  Preoperative Cardiovascular Risk Assessment:  Phillip [name]'s  perioperative risk of a major cardiac event is 11% according to the Revised Cardiac Risk Index (RCRI).  Therefore, he is at high risk for perioperative complications.   His functional capacity is fair at 4.06 METs according to the [place] Activity Status Index (DASI). Recommendations: According to ACC/AHA guidelines, no further cardiovascular testing needed.  The patient may proceed to surgery at acceptable risk.    A copy of this note will be routed to requesting surgeon.  Time:   Today, I have spent 6 minutes with the patient with telehealth technology discussing medical history, symptoms, and management plan.     Sharlene Dory, PA-C  10/10/2022, 10:54 AM

## 2022-10-16 ENCOUNTER — Other Ambulatory Visit: Payer: Self-pay | Admitting: Podiatry

## 2022-10-17 ENCOUNTER — Encounter: Payer: Self-pay | Admitting: Urgent Care

## 2022-10-17 ENCOUNTER — Encounter
Admission: RE | Admit: 2022-10-17 | Discharge: 2022-10-17 | Disposition: A | Discharge status: Home / Self Care | Payer: PPO | Source: Ambulatory Visit | Attending: Podiatry | Admitting: Podiatry

## 2022-10-17 ENCOUNTER — Encounter: Payer: Self-pay | Admitting: Podiatry

## 2022-10-17 VITALS — Ht 72.0 in | Wt 207.2 lb

## 2022-10-17 DIAGNOSIS — I5022 Chronic systolic (congestive) heart failure: Secondary | ICD-10-CM

## 2022-10-17 DIAGNOSIS — Z01810 Encounter for preprocedural cardiovascular examination: Secondary | ICD-10-CM

## 2022-10-17 DIAGNOSIS — Z01812 Encounter for preprocedural laboratory examination: Secondary | ICD-10-CM

## 2022-10-17 DIAGNOSIS — E1122 Type 2 diabetes mellitus with diabetic chronic kidney disease: Secondary | ICD-10-CM

## 2022-10-17 DIAGNOSIS — D638 Anemia in other chronic diseases classified elsewhere: Secondary | ICD-10-CM

## 2022-10-17 DIAGNOSIS — E1149 Type 2 diabetes mellitus with other diabetic neurological complication: Secondary | ICD-10-CM

## 2022-10-17 DIAGNOSIS — I350 Nonrheumatic aortic (valve) stenosis: Secondary | ICD-10-CM

## 2022-10-17 DIAGNOSIS — I214 Non-ST elevation (NSTEMI) myocardial infarction: Secondary | ICD-10-CM

## 2022-10-17 DIAGNOSIS — I10 Essential (primary) hypertension: Secondary | ICD-10-CM

## 2022-10-17 DIAGNOSIS — Z01818 Encounter for other preprocedural examination: Secondary | ICD-10-CM

## 2022-10-17 DIAGNOSIS — I25119 Atherosclerotic heart disease of native coronary artery with unspecified angina pectoris: Secondary | ICD-10-CM

## 2022-10-17 NOTE — Patient Instructions (Addendum)
Your procedure is scheduled on:10-25-22 Friday Report to the Registration Desk on the 1st floor of the [facility].Then proceed to the 2nd floor Surgery Desk To find out your arrival time, please call [phone] between 1PM - 3PM on:10-24-22 Thursday If your arrival time is 6:00 am, do not arrive before that time as the [facility] entrance doors do not open until 6:00 am.  REMEMBER: Instructions that are not followed completely may result in serious medical risk, up to and including death; or upon the discretion of your surgeon and anesthesiologist your surgery may need to be rescheduled.  Do not eat food after midnight the night before surgery.  No gum chewing or hard candies.  You may however, drink Water up to 2 hours before you are scheduled to arrive for your surgery. Do not drink anything within 2 hours of your scheduled arrival time.  Your Surgeon has ordered you a Pre-Surgery Carbohydrate Drink: Gatorade G2 Finish this drink the morning of surgery 2 hours prior to your arrival time to the hospital  One week prior to surgery: Stop Anti-inflammatories (NSAIDS) such as Advil, Aleve, Ibuprofen, Motrin, Naproxen, Naprosyn and Aspirin based products such as Excedrin, Goody's Powder, BC Powder.You may however, take Tylenol if needed for pain up until the day of surgery. Stop ANY OVER THE COUNTER supplements/vitamins NOW (10-17-22) until after surgery (Vitamin B12)  TAKE ONLY THESE MEDICATIONS THE MORNING OF SURGERY WITH A SIP OF WATER: -gabapentin (NEURONTIN)  -isosorbide mononitrate (IMDUR)  -levothyroxine (SYNTHROID)  -tamsulosin (FLOMAX)   Continue your losartan (COZAAR), bumetanide (BUMEX), Aspirin and  clopidogrel (PLAVIX) up until the day prior to surgery-Do NOT take the morning of surgery  Take half of your Insulin (Levemir) the night prior to surgery and NO Insulin the morning of surgery  No Alcohol for 24 hours before or after surgery.  No Smoking including e-cigarettes  for 24 hours before surgery.  No chewable tobacco products for at least 6 hours before surgery.  No nicotine patches on the day of surgery.  Do not use any "recreational" drugs for at least a week (preferably 2 weeks) before your surgery.  Please be advised that the combination of cocaine and anesthesia may have negative outcomes, up to and including death. If you test positive for cocaine, your surgery will be cancelled.  On the morning of surgery brush your teeth with toothpaste and water, you may rinse your mouth with mouthwash if you wish. Do not swallow any toothpaste or mouthwash.  Use CHG Soap as directed on instruction sheet.  Do not wear jewelry, make-up, hairpins, clips or nail polish.  Do not wear lotions, powders, or perfumes.   Do not shave body hair from the neck down 48 hours before surgery.  Contact lenses, hearing aids and dentures may not be worn into surgery.  Do not bring valuables to the hospital. [facility] is not responsible for any missing/lost belongings or valuables.   Notify your doctor if there is any change in your medical condition (cold, fever, infection).  Wear comfortable clothing (specific to your surgery type) to the hospital.  After surgery, you can help prevent lung complications by doing breathing exercises.  Take deep breaths and cough every 1-2 hours. Your doctor may order a device called an Incentive Spirometer to help you take deep breaths. When coughing or sneezing, hold a pillow firmly against your incision with both hands. This is called "splinting." Doing this helps protect your incision. It also decreases belly discomfort.  If  you are being admitted to the hospital overnight, leave your suitcase in the car. After surgery it may be brought to your room.  In case of increased patient census, it may be necessary for you, the patient, to continue your postoperative care in the Same Day Surgery department.  If you are being discharged the  day of surgery, you will not be allowed to drive home. You will need a responsible individual to drive you home and stay with you for 24 hours after surgery.   If you are taking public transportation, you will need to have a responsible individual with you.  Please call the Pre-admissions Testing Dept. at [phone] if you have any questions about these instructions.  Surgery Visitation Policy:  Patients having surgery or a procedure may have two visitors.  Children under the age of 22 must have an adult with them who is not the patient.     Preparing for Surgery with CHLORHEXIDINE GLUCONATE (CHG) Soap  Chlorhexidine Gluconate (CHG) Soap  o An antiseptic cleaner that kills germs and bonds with the skin to continue killing germs even after washing  o Used for showering the night before surgery and morning of surgery  Before surgery, you can play an important role by reducing the number of germs on your skin.  CHG (Chlorhexidine gluconate) soap is an antiseptic cleanser which kills germs and bonds with the skin to continue killing germs even after washing.  Please do not use if you have an allergy to CHG or antibacterial soaps. If your skin becomes reddened/irritated stop using the CHG.  1. Shower the NIGHT BEFORE SURGERY and the MORNING OF SURGERY with CHG soap.  2. If you choose to wash your hair, wash your hair first as usual with your normal shampoo.  3. After shampooing, rinse your hair and body thoroughly to remove the shampoo.  4. Use CHG as you would any other liquid soap. You can apply CHG directly to the skin and wash gently with a scrungie or a clean washcloth.  5. Apply the CHG soap to your body only from the neck down. Do not use on open wounds or open sores. Avoid contact with your eyes, ears, mouth, and genitals (private parts). Wash face and genitals (private parts) with your normal soap.  6. Wash thoroughly, paying special attention to the area where your  surgery will be performed.  7. Thoroughly rinse your body with warm water.  8. Do not shower/wash with your normal soap after using and rinsing off the CHG soap.  9. Pat yourself dry with a clean towel.  10. Wear clean pajamas to bed the night before surgery.  12. Place clean sheets on your bed the night of your first shower and do not sleep with pets.  13. Shower again with the CHG soap on the day of surgery prior to arriving at the hospital.  14. Do not apply any deodorants/lotions/powders.  15. Please wear clean clothes to the hospital.  How to Use an Incentive Spirometer An incentive spirometer is a tool that measures how well you are filling your lungs with each breath. Learning to take long, deep breaths using this tool can help you keep your lungs clear and active. This may help to reverse or lessen your chance of developing breathing (pulmonary) problems, especially infection. You may be asked to use a spirometer: After a surgery. If you have a lung problem or a history of smoking. After a long period of time when you  have been unable to move or be active. If the spirometer includes an indicator to show the highest number that you have reached, your health care provider or respiratory therapist will help you set a goal. Keep a log of your progress as told by your health care provider. What are the risks? Breathing too quickly may cause dizziness or cause you to pass out. Take your time so you do not get dizzy or light-headed. If you are in pain, you may need to take pain medicine before doing incentive spirometry. It is harder to take a deep breath if you are having pain. How to use your incentive spirometer  Sit up on the edge of your bed or on a chair. Hold the incentive spirometer so that it is in an upright position. Before you use the spirometer, breathe out normally. Place the mouthpiece in your mouth. Make sure your lips are closed tightly around it. Breathe in slowly  and as deeply as you can through your mouth, causing the piston or the ball to rise toward the top of the chamber. Hold your breath for 3-5 seconds, or for as long as possible. If the spirometer includes a coach indicator, use this to guide you in breathing. Slow down your breathing if the indicator goes above the marked areas. Remove the mouthpiece from your mouth and breathe out normally. The piston or ball will return to the bottom of the chamber. Rest for a few seconds, then repeat the steps 10 or more times. Take your time and take a few normal breaths between deep breaths so that you do not get dizzy or light-headed. Do this every 1-2 hours when you are awake. If the spirometer includes a goal marker to show the highest number you have reached (best effort), use this as a goal to work toward during each repetition. After each set of 10 deep breaths, cough a few times. This will help to make sure that your lungs are clear. If you have an incision on your chest or abdomen from surgery, place a pillow or a rolled-up towel firmly against the incision when you cough. This can help to reduce pain while taking deep breaths and coughing. General tips When you are able to get out of bed: Walk around often. Continue to take deep breaths and cough in order to clear your lungs. Keep using the incentive spirometer until your health care provider says it is okay to stop using it. If you have been in the hospital, you may be told to keep using the spirometer at home. Contact a health care provider if: You are having difficulty using the spirometer. You have trouble using the spirometer as often as instructed. Your pain medicine is not giving enough relief for you to use the spirometer as told. You have a fever. Get help right away if: You develop shortness of breath. You develop a cough with bloody mucus from the lungs. You have fluid or blood coming from an incision site after you cough. Summary An  incentive spirometer is a tool that can help you learn to take long, deep breaths to keep your lungs clear and active. You may be asked to use a spirometer after a surgery, if you have a lung problem or a history of smoking, or if you have been inactive for a long period of time. Use your incentive spirometer as instructed every 1-2 hours while you are awake. If you have an incision on your chest or abdomen, place a  pillow or a rolled-up towel firmly against your incision when you cough. This will help to reduce pain. Get help right away if you have shortness of breath, you cough up bloody mucus, or blood comes from your incision when you cough. This information is not intended to replace advice given to you by your health care provider. Make sure you discuss any questions you have with your health care provider. Document Revised: 08/09/2019 Document Reviewed: 08/09/2019 Elsevier Patient Education  2023 [employer].

## 2022-10-17 NOTE — Pre-Procedure Instructions (Signed)
Called pt to do anesthesia interview for upcoming surgery on 5-17 with Dr Ether Griffins. Pt states his surgery is not tomorrow but for next Friday 5-24. I told pt that he was on the OR board for tomorrow. While on phone I asked pt if he had stopped his Asa and Plavix and he said no one had called to tell him to stop any of those meds and that he took them both this morning. I told pt that I was going to have to call Dr Earney Mallet office to make sure they are ok with him taking his Plavix and Asa and that I would call him back. Called over to fowlers office and spoke with Asher Muir who said she told pt that his surgery was tomorrow and not next Friday. She went and asked Dr Ether Griffins if he was ok with not having stopped asa and Plavix and he said that was ok. I told Asher Muir that I would call pt back to let him know. Called pt and he said that he did not want to do his surgery tomorrow bc he had already arranged his sister in law to bring him on 5-24. I called Asher Muir back and informed her of this. She said she would reach out to pt and let him know about surgery for 5-24.  I told Asher Muir that if pt needed to stop his Plavix and Asa prior her office would need to let pt know bc my instructions were he could continue it since Dr Ether Griffins was ok doing surgery on 5-17 when pt had not stopped Plavix and asa. Asher Muir verbalized understanding

## 2022-10-21 ENCOUNTER — Encounter: Payer: Self-pay | Admitting: Podiatry

## 2022-10-21 ENCOUNTER — Encounter
Admission: RE | Admit: 2022-10-21 | Discharge: 2022-10-21 | Disposition: A | Discharge status: Home / Self Care | Payer: PPO | Source: Ambulatory Visit | Attending: Podiatry | Admitting: Podiatry

## 2022-10-21 DIAGNOSIS — Z01818 Encounter for other preprocedural examination: Secondary | ICD-10-CM | POA: Diagnosis present

## 2022-10-21 DIAGNOSIS — E119 Type 2 diabetes mellitus without complications: Secondary | ICD-10-CM | POA: Diagnosis not present

## 2022-10-21 LAB — BASIC METABOLIC PANEL
Anion gap: 11 (ref 5–15)
BUN: 67 mg/dL — ABNORMAL HIGH (ref 8–23)
CO2: 22 mmol/L (ref 22–32)
Calcium: 8 mg/dL — ABNORMAL LOW (ref 8.9–10.3)
Chloride: 105 mmol/L (ref 98–111)
Creatinine, Ser: 3.67 mg/dL — ABNORMAL HIGH (ref 0.61–1.24)
GFR, Estimated: 17 mL/min — ABNORMAL LOW (ref >60)
Glucose, Bld: 147 mg/dL — ABNORMAL HIGH (ref 70–99)
Potassium: 4.2 mmol/L (ref 3.5–5.1)
Sodium: 138 mmol/L (ref 135–145)

## 2022-10-21 LAB — CBC
HCT: 34.5 % — ABNORMAL LOW (ref 39.0–52.0)
Hemoglobin: 10.8 g/dL — ABNORMAL LOW (ref 13.0–17.0)
MCH: 27.9 pg (ref 26.0–34.0)
MCHC: 31.3 g/dL (ref 30.0–36.0)
MCV: 89.1 fL (ref 80.0–100.0)
Platelets: 147 10*3/uL — ABNORMAL LOW (ref 150–400)
RBC: 3.87 MIL/uL — ABNORMAL LOW (ref 4.22–5.81)
RDW: 16.3 % — ABNORMAL HIGH (ref 11.5–15.5)
WBC: 5.8 10*3/uL (ref 4.0–10.5)
nRBC: 0 % (ref 0.0–0.2)

## 2022-10-21 LAB — HEMOGLOBIN A1C
Hgb A1c MFr Bld: 7.3 % — ABNORMAL HIGH (ref 4.8–5.6)
Mean Plasma Glucose: 162.81 mg/dL

## 2022-10-21 NOTE — Progress Notes (Signed)
Perioperative / Anesthesia Services  Pre-Admission Testing Clinical Review / Preoperative Anesthesia Consult  Date: 10/22/22  Patient Demographics:  Name: Phillip [name] DOB:   1955-07-28 MRN:   [id number]  Planned Surgical Procedure(s):    Case: [id number] Date/Time: 10/25/22 0845   Procedure: TOE INTERPHALANGEAL JOINT - SECOND (Left)   Anesthesia type: Choice   Pre-op diagnosis:      L97.524 - Ulcer of left foot with necrosis of bone     E11.621 L97.509 Z79.4 - Type 2 diabetes mellites with foot ulcer with long term current use of insulin     I73.9 - PVD peripheral vascular disease with claudication   Location: [facility] OR ROOM 01 / [facility]   Surgeons: Gwyneth Revels, DPM     NOTE: Available PAT nursing documentation and vital signs have been reviewed. Clinical nursing staff has updated patient's PMH/PSHx, current medication list, and drug allergies/intolerances to ensure comprehensive history available to assist in medical decision making as it pertains to the aforementioned surgical procedure and anticipated anesthetic course. Extensive review of available clinical information personally performed.  PMH and PSHx updated with any diagnoses/procedures that  may have been inadvertently omitted during his intake with the pre-admission testing department's nursing staff.  Clinical Discussion:  Phillip [name] is a 67 y.o. male who is submitted for pre-surgical anesthesia review and clearance prior to him undergoing the above procedure. Patient is a Former Smoker (12.5 pack years; quit 06/2001). Pertinent PMH includes: CAD (s/p CABG), type II NSTEMI, aortic stenosis, pulmonary hypertension, CHF, cardiac murmur, PVD, aortic atherosclerosis HTN, HLD, T2DM, hypothyroidism, CKD-IV, asthma, OSAH (does not require nocturnal PAP therapy), GERD (no daily Tx), dysphagia, anemia of chronic disease, thrombocytopenia, subacute osteomyelitis, lower extremity edema, OA,  chronic lower back pain, insomnia, history of marijuana use.   Patient is followed by cardiology Tomie China, MD). He was last seen in the cardiology clinic on 04/12/2022; notes reviewed. At the time of his clinic visit, patient doing well overall from a cardiovascular perspective. Patient denied any chest pain, shortness of breath, PND, orthopnea, palpitations, significant peripheral edema, weakness, fatigue, vertiginous symptoms, or presyncope/syncope. Patient with a past medical history significant for cardiovascular diagnoses. Documented physical exam was grossly benign, providing no evidence of acute exacerbation and/or decompensation of the patient's known cardiovascular conditions.  Of note, patient's complete records regarding his cardiovascular history for review at time of consult.  Information gathered from patient report and from notes provided by his local cardiologist.  Patient reported to have undergone four-vessel revascularization (CABG) in 2003. Records regarding procedure, including bypassed vessels, unavailable for review at this time.   Patient with significant PVD that has resulted in/required vascular intervention in the past.  PTA and balloon angioplasty of the LEFT below the knee popliteal artery on 03/02/2018.  PTA and balloon angioplasty of the LEFT tibioperoneal trunk, most proximal peroneal artery, and LEFT popliteal artery on 09/30/2022.  Diagnosed with type II NSTEMI (demand ischemia) on 09/26/2020.  Troponins were trended: 0.054 --> 0.056 --> 0.052 ng/mL.  Most recent TTE was performed on 04/24/2022 revealing a moderately reduced left ventricular systolic function with an EF of 35-40%.  Posterior wall is akinetic.  The anterolateral wall, inferior septum, entire inferior wall, apical lateral segment, and apex were all noted to be hypokinetic. Left ventricular cavity size moderately dilated. Left ventricular diastolic Doppler parameters consistent with a restrictive filling  pattern (G3DD).  Right ventricular size normal with a moderately reduced systolic function.  Severely elevated PASP  of 80.6 mmHg noted; consistent with known pulmonary hypertension. There was mild to moderate mitral valve regurgitation.  Aortic valve noted to be mildly calcified resulting in mild to moderate valvular stenosis with moderate regurgitation.  Mean transaortic valve gradient was 15 mmHg; AVA (VTI) = 1.04 cm. All other transvalvular gradients were noted to be normal.  Patient is on daily DAPT therapy (ASA + clopidogrel).  He is reportedly compliant with therapy with no evidence or reports of GI bleeding.  Blood pressure well controlled at 138/72 mmHg on currently prescribed alpha blocker (clonidine), diuretic (bumetanide), nitrate (isosorbide mononitrate), and ARB (losartan) therapies.  Patient has a supply of short acting nitrates (NTG) to use on a as needed basis for any episodes of angina not managed by his routine regimen; denied recent use. Patient is on lovastatin for his HLD diagnosis and ASCVD prevention.  T2DM was well-controlled on currently prescribed regimen when patient was last seen by cardiology; last A1c was 6.5% when checked on 05/01/2022.  Of note, hemoglobin A1c has been rechecked since patient was last seen by cardiology with increase in value to 7.3% when checked on 10/21/2022.  Patient does have an OSAH diagnosis, however he does not require the use of nocturnal PAP therapy.  Patient maintains a sedentary lifestyle overall.  He has no formal exercise regimen.  With that being said, patient still felt to be able to achieve at least 4 METS of physical activity without experiencing any significant degree of angina/anginal equivalent symptoms.  No changes were made to his medication regimen.  Patient to follow-up with outpatient cardiology in 9 months or sooner if needed.  Phillip [name] is scheduled for an AMPUTATION OF LEFT  SECOND TOE INTERPHALANGEAL JOINT on 10/25/2022 with  Dr. Gwyneth Revels, D.P.M.  Given patient's past medical history significant for cardiovascular diagnoses, presurgical cardiac clearance was sought by the PAT team.  Per cardiology, "Mr. Bandel's perioperative risk of a major cardiac event is 11% according to the Revised Cardiac Risk Index (RCRI).  Therefore, he is at HIGH RISK for perioperative complications.  His functional capacity is fair at 4.06 METs according to the Duke Activity Status Index (DASI). According to ACC/AHA guidelines, no further cardiovascular testing needed.  The patient may proceed to surgery at ACCEPTABLE risk".  Again, given his extensive PVD and cardiovascular history, patient remains on daily DAPT therapy.  He has been instructed on recommendations from his podiatrist and cardiologist for continuing with his DAPT medications (ASA + clopidogrel) throughout his perioperative course.  Patient denies previous perioperative complications with anesthesia in the past. In review of the available records, it is noted that patient underwent a general anesthetic course here at [facility] (ASA IV) in 09/2018 without documented complications.      10/17/2022   12:00 PM 09/30/2022    5:32 PM 09/30/2022    5:17 PM  Vitals with BMI  Height 6\' 0"     Weight 207 lbs 4 oz    BMI 28.1    Systolic  144 167  Diastolic  80 85    Providers/Specialists:   NOTE: Primary physician provider listed below. Patient may have been seen by APP or partner within same practice.   PROVIDER ROLE / SPECIALTY LAST Sullivan Lone, DPM Podiatry (Surgeon) 10/08/2022  Gordan Payment., MD Primary Care Provider 05/01/2022  Festus Barren, MD Vascular Surgery 09/25/2022  Revankar, Aundra Dubin, MD  Cardiology 04/12/2022  Rolanda Jay, MD Nephrology 10/15/2022   Allergies:  Patient has no known allergies.  Current Home Medications:   No current facility-administered medications for this encounter.    acetaminophen  (TYLENOL) 500 MG tablet   aspirin EC 81 MG tablet   Blood Glucose Monitoring Suppl (TGT BLOOD GLUCOSE MONITORING) w/Device KIT   bumetanide (BUMEX) 1 MG tablet   cloNIDine (CATAPRES) 0.3 MG tablet   clopidogrel (PLAVIX) 75 MG tablet   gabapentin (NEURONTIN) 600 MG tablet   insulin detemir (LEVEMIR) 100 unit/ml SOLN   isosorbide mononitrate (IMDUR) 120 MG 24 hr tablet   Lancets MISC   levothyroxine (SYNTHROID) 25 MCG tablet   losartan (COZAAR) 25 MG tablet   lovastatin (MEVACOR) 40 MG tablet   nitroGLYCERIN (NITROSTAT) 0.4 MG SL tablet   tamsulosin (FLOMAX) 0.4 MG CAPS capsule   vitamin B-12 (CYANOCOBALAMIN) 500 MCG tablet   Vitamin D, Ergocalciferol, (DRISDOL) 1.25 MG (50000 UNIT) CAPS capsule   History:   Past Medical History:  Diagnosis Date   Abdominopelvic abscess (HCC) 09/29/2018   Abscess of appendix 09/22/2018   Acquired spondylolisthesis of lumbosacral region 03/20/2020   Anemia of chronic disease 08/18/2019   Aortic atherosclerosis (HCC)    Aortic stenosis    a.) TTE 09/15/2018: mild-mod (MPG 19); b.) TTE 03/21/2021: mild-mod (MPG 14.3); c.) TTE 04/24/2022: mild- mod (MPG 15)   Appendicitis 09/08/2018   Asthma    Benign hypertension with CKD (chronic kidney disease) stage IV (HCC) 02/24/2018   Benign prostatic hyperplasia without lower urinary tract symptoms 01/14/2018   Bilateral lower extremity edema 11/04/2018   Bradycardia 03/25/2018   Bruit of right carotid artery 07/26/2015   CAD (coronary artery disease) 2003   a.) s/p 4v CABG 2003   Cardiac murmur 07/26/2015   Chronic combined systolic and diastolic CHF (congestive heart failure) (HCC) 07/26/2015   a.) TTE 09/15/2018: EF 50-55%, mod LVH, RVE, BAE, mild-mod TR, AoV sclerosis, G1DD; b.) MPI 10/27/2019: EF <30%; c.) TTE 03/21/2021: EF 35-40%, post AK, inf HK, mod LVH, mod red RVSF, mod LAE, mod Aov sclerosis, G2DD; c.) TTE 04/24/2022: EF 35-40%, post AK, glok HK, mod LVE, mod red RVSF, mild-mod MR, Aov sclerosis,  G3DD   Chronic idiopathic constipation 11/02/2018   Chronic low back pain without sciatica 12/02/2018   Chronic pain of right hip 03/31/2020   CKD (chronic kidney disease) stage 4, GFR 15-29 ml/min (HCC) 10/07/2018   Coronary artery disease    Dysphagia 07/26/2015   Erectile dysfunction 07/14/2017   Essential hypertension 02/24/2018   Foot ulcer, left (HCC)    GERD (gastroesophageal reflux disease)    Hematuria 07/27/2015   History of marijuana use    Hyperlipidemia 07/26/2015   Hypertension    Hypothyroidism 11/14/2015   Insomnia 11/02/2018   Lumbar spondylosis 03/20/2020   Malaise and fatigue 03/25/2018   Myocardial infarction due to demand ischemia (HCC) 09/26/2020   a.) Type II NSTEMI; b.) troponins were trended 0.54 --> 0.56 --> 0.52 ng/mL   Non-compliance 05/05/2019   Osseous and subluxation stenosis of intervertebral foramina of lumbar region 03/20/2020   Osteoarthritis 10/06/2019   Peripheral vascular disease (HCC)    Personal history of tobacco use, presenting hazards to health 09/27/2020   Polyneuropathy in diabetes (HCC) 05/07/2019   Pulmonary HTN (HCC)    a.) TTE 05/12/2019: PASP 71; b.) TTE 09/27/2020: PASP >70; c.) TTE 03/21/2021: RVSP 43; d.) TTE 04/24/2022: RVSP 80.6   Puncture wound of right hip 04/02/2018   PVD (peripheral vascular disease) with claudication (HCC)    a.) s/p PTA 03/02/2018 -  balloon angioplasty LEFT below knee popliteal artery; b.) s/p PTA 09/30/2022: baloon angioplasty LEFT tibioperoneal trunck, most proximal peroneal artery, and LEFT popliteal artery.   S/P CABG x 4 2003   Sleep apnea 07/26/2015   a.) does not require nocturnal PAP therapy   Subacute osteomyelitis of left foot (HCC) 03/25/2018   Thrombocytopenia (HCC)    Type 2 diabetes mellitus with stage 4 chronic kidney disease, with long-term current use of insulin (HCC) 07/26/2015   Vitamin B12 deficiency 06/16/2019   Vitamin D deficiency 05/07/2018   Past Surgical History:   Procedure Laterality Date   AMPUTATION TOE Left 03/13/2018   Procedure: AMPUTATION TOE-MPJ;  Surgeon: Gwyneth Revels, DPM;  Location: [facility] ORS;  Service: Podiatry;  Laterality: Left;   ANGIOPLASTY     CARDIAC CATHETERIZATION     CORONARY ARTERY BYPASS GRAFT N/A 2003   LAPAROSCOPIC APPENDECTOMY N/A 09/08/2018   Procedure: APPENDECTOMY LAPAROSCOPIC;  Surgeon: Leafy Ro, MD;  Location: [facility] ORS;  Service: General;  Laterality: N/A;   LOWER EXTREMITY ANGIOGRAPHY Left 03/02/2018   Procedure: LOWER EXTREMITY ANGIOGRAPHY;  Surgeon: Annice Needy, MD;  Location: [facility] INVASIVE CV LAB;  Service: Cardiovascular;  Laterality: Left;   LOWER EXTREMITY ANGIOGRAPHY Left 09/30/2022   Procedure: Lower Extremity Angiography;  Surgeon: Annice Needy, MD;  Location: [facility] INVASIVE CV LAB;  Service: Cardiovascular;  Laterality: Left;   ROTATOR CUFF REPAIR Left    Family History  Problem Relation Age of Onset   Hyperlipidemia Mother    Hypertension Mother    Diabetes Mother    Heart attack Brother    Heart disease Brother    Lung disease Father    Social History   Tobacco Use   Smoking status: Former    Packs/day: 0.50    Years: 25.00    Additional pack years: 0.00    Total pack years: 12.50    Types: Cigarettes    Quit date: 2003    Years since quitting: 21.4   Smokeless tobacco: Never  Vaping Use   Vaping Use: Never used  Substance Use Topics   Alcohol use: Not Currently    Comment: 2003   Drug use: Not Currently    Types: Marijuana    Pertinent Clinical Results:  LABS:   Hospital Outpatient Visit on 10/21/2022  Component Date Value Ref Range Status   Sodium 10/21/2022 138  135 - 145 mmol/L Final   Potassium 10/21/2022 4.2  3.5 - 5.1 mmol/L Final   Chloride 10/21/2022 105  98 - 111 mmol/L Final   CO2 10/21/2022 22  22 - 32 mmol/L Final   Glucose, Bld 10/21/2022 147 (H)  70 - 99 mg/dL Final   Glucose reference range applies only to samples taken after fasting for at least 8 hours.    BUN 10/21/2022 67 (H)  8 - 23 mg/dL Final   Creatinine, Ser 10/21/2022 3.67 (H)  0.61 - 1.24 mg/dL Final   Calcium 16/03/9603 8.0 (L)  8.9 - 10.3 mg/dL Final   GFR, Estimated 10/21/2022 17 (L)  >60 mL/min Final   Comment: (NOTE) Calculated using the CKD-EPI Creatinine Equation (2021)    Anion gap 10/21/2022 11  5 - 15 Final   Performed at [facility], [address] [place], [place] [address]   WBC 10/21/2022 5.8  4.0 - 10.5 K/uL Final   RBC 10/21/2022 3.87 (L)  4.22 - 5.81 MIL/uL Final   Hemoglobin 10/21/2022 10.8 (L)  13.0 - 17.0 g/dL Final   HCT  10/21/2022 34.5 (L)  39.0 - 52.0 % Final   MCV 10/21/2022 89.1  80.0 - 100.0 fL Final   MCH 10/21/2022 27.9  26.0 - 34.0 pg Final   MCHC 10/21/2022 31.3  30.0 - 36.0 g/dL Final   RDW 16/03/9603 16.3 (H)  11.5 - 15.5 % Final   Platelets 10/21/2022 147 (L)  150 - 400 K/uL Final   nRBC 10/21/2022 0.0  0.0 - 0.2 % Final   Performed at [facility], [address] [place], [place] [address]   Hgb A1c MFr Bld 10/21/2022 7.3 (H)  4.8 - 5.6 % Final   Comment: (NOTE) Pre diabetes:          5.7%-6.4%  Diabetes:              >6.4%  Glycemic control for   <7.0% adults with diabetes    Mean Plasma Glucose 10/21/2022 162.81  mg/dL Final   Performed at [facility], [address], [place], [place] [address]    ECG: Date: 10/22/2022 Time ECG obtained: 0853 AM Rate: 74 bpm Rhythm: normal sinus Axis (leads I and aVF): Normal Intervals: PR 162 ms. QRS 124 ms. QTc 441 ms. ST segment and T wave changes: Inferolateral ST and T wave abnormalities.  Evidence of an age undetermined inferior infarct present. comparison: Similar to previous tracing obtained on 04/12/2022   IMAGING / PROCEDURES: VAS [place] ABI WITH/WO TBI performed on 09/19/2022 Doppler waveforms and TBI suggest significant tibial level arterial occlusive disease on the RIGHT.  Ankle pressures not obtained due to no noncompressible vessels. Doppler waveforms  suggest significant tibial level arterial disease on the LEFT.  Ankle pressures not obtained due: Noncompressible vessels  TRANSTHORACIC ECHOCARDIOGRAM performed on 04/24/2022 Left ventricular ejection fraction, by estimation, is 35 to 40%. The left ventricle has moderately decreased function. The left ventricle demonstrates regional wall motion abnormalities. The left ventricular internal cavity size was moderately dilated. Left ventricular diastolic parameters are consistent with Grade III diastolic dysfunction (restrictive).  Right ventricular systolic function is moderately reduced. The right ventricular size is normal. There is severely elevated pulmonary artery systolic pressure.  The mitral valve is degenerative. Mild to moderate mitral valve regurgitation. No evidence of mitral stenosis.  Low flow AS. The aortic valve has an indeterminant number of cusps. There is mild calcification of the aortic valve. There is moderate thickening of the aortic valve. Aortic valve regurgitation is moderate. Mild to moderate aortic valve stenosis.Aortic valve mean gradient measures 15.0 mmHg; AVA (VTI) = 1.04 cm.  The inferior vena cava is dilated in size with <50% respiratory variability, suggesting right atrial pressure of 15 mmHg.   CT ABDOMEN PELVIS WO CONTRAST performed on 11/06/2020 No acute abdominopelvic findings. Cholelithiasis without evidence of acute cholecystitis. Chronic bilateral L5 pars interarticularis defects with grade 1 anterolisthesis of L5 on S1. Aortic atherosclerosis  MYOCARDIAL PERFUSION IMAGING STUDY (LEXISCAN) performed on 10/27/2019 The left ventricular ejection fraction is severely decreased (<30%). Nuclear stress EF: 23%. There was no ST segment deviation noted during stress. There is a medium defect of moderate severity present in the basal inferior, mid anterior, mid inferolateral, apical anterior, apical inferior and apical lateral location. Findings consistent with prior  myocardial infarction. This is an intermediate risk study. No evidence of ischemia. RV is enlarged and prominent.  Impression and Plan:  Phillip [name] has been referred for pre-anesthesia review and clearance prior to him undergoing the planned anesthetic and procedural courses. Available labs, pertinent testing, and  imaging results were personally reviewed by me in preparation for upcoming operative/procedural course. [facility] medical record has been updated following extensive record review and patient interview with PAT staff.   Patient has a significant cardiovascular history (CAD, aortic stenosis, CHF (G3DD), severe pulmonary hypertension). While patient has been appropriately cleared by cardiology with an overall HIGH/ACCEPTABLE risk of significant perioperative cardiovascular complications, my review has left me with concerns. Query whether we are able to provide a safe and effective anesthetic course here at [facility]. Again, patient has severe pulmonary hypertension (RVSP 80.6 mmHg). This places patient at significant risk of complications intraoperatively. Perioperative management of this patient would generally include treatment with iNO (inhaled nitric oxide), which unfortunately is not available at our facility. In efforts to ensure that case is being objectively reviewed, I discussed with case details and medical history with attending on call anesthesiologist Karlton Lemon, MD). MD agreed with my assessment, validated my concerns, and recommended transfer of care.   Call placed to surgeon's office to make them aware that it is felt that patient would be better served by a tertiary care center that is better equipped at providing care to patients with advanced cardiovascular and cardiopulmonary diagnoses. Given his advanced cardiovascular/cardiopulmonary diagnoses, anesthesia service is asking that care be transferred to a tertiary care facility for his  elective podiatric procedure.   Quentin Mulling, MSN, APRN, FNP-C, CEN [facility]  Peri-operative Services Nurse Practitioner Phone: [phone] Fax: [phone] 10/22/22 1:30 PM  NOTE: This note has been prepared using Dragon dictation software. Despite my best ability to proofread, there is always the potential that unintentional transcriptional errors may still occur from this process.

## 2022-10-22 ENCOUNTER — Encounter: Payer: Self-pay | Admitting: Podiatry

## 2022-10-25 ENCOUNTER — Encounter: Admission: RE | Payer: Self-pay | Source: Home / Self Care

## 2022-10-25 ENCOUNTER — Ambulatory Visit: Admission: RE | Admit: 2022-10-25 | Payer: PPO | Source: Home / Self Care | Admitting: Podiatry

## 2022-10-25 DIAGNOSIS — E1122 Type 2 diabetes mellitus with diabetic chronic kidney disease: Secondary | ICD-10-CM

## 2022-10-25 DIAGNOSIS — Z01812 Encounter for preprocedural laboratory examination: Secondary | ICD-10-CM

## 2022-10-25 HISTORY — DX: Nonrheumatic aortic (valve) stenosis: I35.0

## 2022-10-25 HISTORY — DX: Atherosclerosis of aorta: I70.0

## 2022-10-25 HISTORY — DX: Myocardial infarction type 2: I21.A1

## 2022-10-25 HISTORY — DX: Non-pressure chronic ulcer of other part of left foot with unspecified severity: L97.529

## 2022-10-25 HISTORY — DX: Cannabis use, unspecified, in remission: F12.91

## 2022-10-25 HISTORY — DX: Thrombocytopenia, unspecified: D69.6

## 2022-10-25 HISTORY — DX: Pulmonary hypertension, unspecified: I27.20

## 2022-10-25 SURGERY — AMPUTATION, FOOT, RAY
Anesthesia: Choice | Laterality: Left

## 2023-03-19 ENCOUNTER — Other Ambulatory Visit: Payer: Self-pay | Admitting: Cardiology

## 2023-03-19 DIAGNOSIS — I10 Essential (primary) hypertension: Secondary | ICD-10-CM

## 2023-05-05 ENCOUNTER — Other Ambulatory Visit: Payer: Self-pay | Admitting: Cardiology

## 2023-05-05 DIAGNOSIS — I10 Essential (primary) hypertension: Secondary | ICD-10-CM

## 2023-05-06 ENCOUNTER — Other Ambulatory Visit: Payer: Self-pay | Admitting: Cardiology

## 2023-05-06 DIAGNOSIS — I10 Essential (primary) hypertension: Secondary | ICD-10-CM

## 2023-05-15 NOTE — Progress Notes (Signed)
Cardiology Office Note:  .   Date:  05/16/2023  ID:  CLIFFORD WESTMORELAND, DOB 1955-09-20, MRN [id number] PCP: Gordan Payment., MD  [facility] Providers Cardiologist:  Garwin Brothers, MD    History of Present Illness: ARNEY DRACE is a 67 y.o. male with a past medical history of combined heart failure, PVD, CAD s/p CABG x 4 2003, aortic regurgitation, CKD stage IV--follows with Dr. Janit Pagan, aortic stenosis, OSA, DM 2, dyslipidemia.  04/26/2022 EF 35 to 40% grade 3 diastolic dysfunction, severely elevated PASP, mild to moderate MR, mild calcification of aortic valve with mild to moderate aortic stenosis 10/27/2019 Lexiscan intermediate risk, no evidence of ischemia however EF is decreased, medium defect of moderate severity 2003 CABG x 4  Most recently evaluated by Dr. Tomie China on 04/12/2022, was doing stable from a cardiac perspective, and advised to follow up in 9 months.   He presents today for follow-up of his CAD.  He has been doing okay from a cardiac perspective, has been bothered by fatigue-actually states he would like to exercise more but cannot secondary to bilateral leg pain.  Was previously evaluated in April by Vascular but has not seen since. He denies chest pain, palpitations, dyspnea, pnd, orthopnea, n, v, dizziness, syncope, edema, weight gain, or early satiety.   ROS: Review of Systems  Constitutional:  Positive for malaise/fatigue.  Cardiovascular:  Positive for claudication.     Studies Reviewed: .        Cardiac Studies & Procedures     STRESS TESTS  MYOCARDIAL PERFUSION IMAGING 10/27/2019  Narrative  The left ventricular ejection fraction is severely decreased (<30%).  Nuclear stress EF: 23%.  There was no ST segment deviation noted during stress.  Defect 1: There is a medium defect of moderate severity present in the basal inferior, mid anterior, mid inferolateral, apical anterior, apical inferior and apical lateral location.  Findings  consistent with prior myocardial infarction.  This is an intermediate risk study.  No evidence of ischemia. RV is enlarged and prominent.  ECHOCARDIOGRAM  ECHOCARDIOGRAM COMPLETE 04/24/2022  Narrative ECHOCARDIOGRAM REPORT    Patient Name:   KAIDENCE HOYLMAN Date of Exam: 04/24/2022 Medical Rec #:  [id number]        Height:       72.0 in Accession #:    [id number]       Weight:       207.6 lb Date of Birth:  [birth year]       BSA:          2.165 m Patient Age:    65 years         BP:           138/72 mmHg Patient Gender: M                HR:           79 bpm. Exam Location:  [place]  Procedure: 2D Echo, Cardiac Doppler, Color Doppler and Strain Analysis  Indications:    Moderate aortic regurgitation [I35.1 (ICD-10-CM)]; Essential hypertension [I10 (ICD-10-CM)]  History:        Patient has prior history of Echocardiogram examinations, most recent 03/21/2021. CHF, CAD and Previous Myocardial Infarction, Aortic Valve Disease; Risk Factors:Dyslipidemia.  Sonographer:    Margreta Journey RDCS Referring Phys: Rito Ehrlich [facility]   Sonographer Comments: Suboptimal subcostal window. IMPRESSIONS   1. Left ventricular ejection fraction, by estimation, is 35 to 40%. The left ventricle has  moderately decreased function. The left ventricle demonstrates regional wall motion abnormalities (see scoring diagram/findings for description). The left ventricular internal cavity size was moderately dilated. Left ventricular diastolic parameters are consistent with Grade III diastolic dysfunction (restrictive). 2. Right ventricular systolic function is moderately reduced. The right ventricular size is normal. There is severely elevated pulmonary artery systolic pressure. 3. The mitral valve is degenerative. Mild to moderate mitral valve regurgitation. No evidence of mitral stenosis. 4. Low flow AS . The aortic valve has an indeterminant number of cusps. There is mild calcification of the  aortic valve. There is moderate thickening of the aortic valve. Aortic valve regurgitation is moderate. Mild to moderate aortic valve stenosis. 5. The inferior vena cava is dilated in size with <50% respiratory variability, suggesting right atrial pressure of 15 mmHg.  FINDINGS Left Ventricle: Left ventricular ejection fraction, by estimation, is 35 to 40%. The left ventricle has moderately decreased function. The left ventricle demonstrates regional wall motion abnormalities. Global longitudinal strain performed but not reported based on interpreter judgement due to suboptimal tracking. The left ventricular internal cavity size was moderately dilated. There is no left ventricular hypertrophy. Left ventricular diastolic parameters are consistent with Grade III diastolic dysfunction (restrictive).   LV Wall Scoring: The posterior wall is akinetic. The antero-lateral wall, inferior septum, entire inferior wall, apical lateral segment, and apex are hypokinetic. The entire anterior wall and anterior septum are normal.  Right Ventricle: The right ventricular size is normal. No increase in right ventricular wall thickness. Right ventricular systolic function is moderately reduced. There is severely elevated pulmonary artery systolic pressure. The tricuspid regurgitant velocity is 4.26 m/s, and with an assumed right atrial pressure of 8 mmHg, the estimated right ventricular systolic pressure is 80.6 mmHg.  Left Atrium: Left atrial size was normal in size.  Right Atrium: Right atrial size was normal in size.  Pericardium: There is no evidence of pericardial effusion.  Mitral Valve: The mitral valve is degenerative in appearance. There is moderate thickening of the anterior and posterior mitral valve leaflet(s). Mild mitral annular calcification. Mild to moderate mitral valve regurgitation. No evidence of mitral valve stenosis.  Tricuspid Valve: The tricuspid valve is normal in structure. Tricuspid  valve regurgitation is mild . No evidence of tricuspid stenosis.  Aortic Valve: Low flow AS. The aortic valve has an indeterminant number of cusps. There is mild calcification of the aortic valve. There is moderate thickening of the aortic valve. Aortic valve regurgitation is moderate. Aortic regurgitation PHT measures 383 msec. Mild to moderate aortic stenosis is present. Aortic valve mean gradient measures 15.0 mmHg. Aortic valve peak gradient measures 27.2 mmHg. Aortic valve area, by VTI measures 1.04 cm.  Pulmonic Valve: The pulmonic valve was normal in structure. Pulmonic valve regurgitation is trivial. No evidence of pulmonic stenosis.  Aorta: The aortic arch was not well visualized and the aortic root and ascending aorta are structurally normal, with no evidence of dilitation.  Venous: A systolic blunting flow pattern is recorded from the right upper pulmonary vein. The inferior vena cava is dilated in size with less than 50% respiratory variability, suggesting right atrial pressure of 15 mmHg.  IAS/Shunts: No atrial level shunt detected by color flow Doppler.   LEFT VENTRICLE PLAX 2D LVIDd:         6.30 cm   Diastology LVIDs:         5.60 cm   LV e' medial:    4.24 cm/s LV PW:  1.00 cm   LV E/e' medial:  27.6 LV IVS:        1.30 cm   LV e' lateral:   5.98 cm/s LVOT diam:     2.00 cm   LV E/e' lateral: 19.6 LV SV:         50 LV SV Index:   23 LVOT Area:     3.14 cm   RIGHT VENTRICLE            IVC RV Basal diam:  3.80 cm    IVC diam: 2.20 cm RV Mid diam:    3.10 cm RV S prime:     6.85 cm/s TAPSE (M-mode): 1.0 cm  LEFT ATRIUM             Index        RIGHT ATRIUM           Index LA diam:        4.10 cm 1.89 cm/m   RA Area:     16.20 cm LA Vol (A2C):   62.5 ml 28.87 ml/m  RA Volume:   37.40 ml  17.28 ml/m LA Vol (A4C):   75.4 ml 34.83 ml/m LA Biplane Vol: 68.7 ml 31.74 ml/m AORTIC VALVE AV Area (Vmax):    0.79 cm AV Area (Vmean):   0.79 cm AV Area  (VTI):     1.04 cm AV Vmax:           261.00 cm/s AV Vmean:          185.000 cm/s AV VTI:            0.482 m AV Peak Grad:      27.2 mmHg AV Mean Grad:      15.0 mmHg LVOT Vmax:         65.90 cm/s LVOT Vmean:        46.500 cm/s LVOT VTI:          0.159 m LVOT/AV VTI ratio: 0.33 AI PHT:            383 msec  AORTA Ao Root diam: 3.60 cm Ao Asc diam:  3.70 cm  MITRAL VALVE                  TRICUSPID VALVE MV Area (PHT): 4.68 cm       TR Peak grad:   72.6 mmHg MV Decel Time: 162 msec       TR Vmax:        426.00 cm/s MR Peak grad:    69.9 mmHg MR Mean grad:    51.0 mmHg    SHUNTS MR Vmax:         418.00 cm/s  Systemic VTI:  0.16 m MR Vmean:        339.0 cm/s   Systemic Diam: 2.00 cm MR PISA:         0.57 cm MR PISA Eff ROA: 5 mm MR PISA Radius:  0.30 cm MV E velocity: 117.00 cm/s  Norman Herrlich MD Electronically signed by Norman Herrlich MD Signature Date/Time: 04/26/2022/3:26:27 PM    Final             Risk Assessment/Calculations:     HYPERTENSION CONTROL Vitals:   05/16/23 1345 05/16/23 1416  BP: (!) 169/89 (!) 152/80    The patient's blood pressure is elevated above target today.  In order to address the patient's elevated BP: A new medication was prescribed today.  Physical Exam:   VS:  BP (!) 152/80   Pulse 67   Ht 6' (1.829 m)   Wt 204 lb (92.5 kg)   SpO2 94%   BMI 27.67 kg/m    Wt Readings from Last 3 Encounters:  05/16/23 204 lb (92.5 kg)  10/17/22 207 lb 3.7 oz (94 kg)  09/30/22 206 lb (93.4 kg)     GEN: Well nourished, well developed in no acute distress NECK: No JVD; No carotid bruits CARDIAC: RRR, 2/6 systolic murmur, rubs, gallops RESPIRATORY:  Clear to auscultation without rales, wheezing or rhonchi  ABDOMEN: Soft, non-tender, non-distended EXTREMITIES:  +1 pitting edema mid tibia; No deformity   ASSESSMENT AND PLAN:   HFrEF -most recent EF 35 to 40%, NYHA class II, euvolemic.  GDMT is prohibited secondary to kidney  dysfunction, he is on low-dose losartan 25 mg daily.  Was previously on carvedilol however is not currently on his med list and he is uncertain if he is still taking it.  CAD-s/p CABG 2003 >>  Lexiscan in 2021 revealed intermediate risk, no evidence of ischemia however EF is decreased, medium defect of moderate severity. Stable with no anginal symptoms. No indication for ischemic evaluation.  Continue Plavix 75 mg daily, previously on carvedilol however appears he is not taking it currently.  Hypertension-blood pressure is elevated in the office today 155/52, states he gets elevated readings at home as well.  Currently only on losartan 25 mg daily and clonidine 0.3 mg, will start him on amlodipine 5 mg daily.  PAD-follows with vascular in [place], will send back to them for follow up, most bothered today by claudication.   Dyslipidemia-will check direct LDL today.  DM2-last A1c was elevated at 7.3%, will repeat his A1c for him today.  Disposition-start amlodipine 5 mg daily, return in 6 weeks, CBC, BMET, direct LDL, A1c today, ambulatory referral to [facility].      Signed, Flossie Dibble, NP

## 2023-05-16 ENCOUNTER — Encounter: Payer: Self-pay | Admitting: Cardiology

## 2023-05-16 ENCOUNTER — Ambulatory Visit: Payer: PPO | Attending: Cardiology | Admitting: Cardiology

## 2023-05-16 VITALS — BP 152/80 | HR 67 | Ht 72.0 in | Wt 204.0 lb

## 2023-05-16 DIAGNOSIS — I5033 Acute on chronic diastolic (congestive) heart failure: Secondary | ICD-10-CM

## 2023-05-16 DIAGNOSIS — I350 Nonrheumatic aortic (valve) stenosis: Secondary | ICD-10-CM

## 2023-05-16 DIAGNOSIS — I2510 Atherosclerotic heart disease of native coronary artery without angina pectoris: Secondary | ICD-10-CM

## 2023-05-16 DIAGNOSIS — N184 Chronic kidney disease, stage 4 (severe): Secondary | ICD-10-CM | POA: Diagnosis not present

## 2023-05-16 DIAGNOSIS — I739 Peripheral vascular disease, unspecified: Secondary | ICD-10-CM

## 2023-05-16 DIAGNOSIS — Z131 Encounter for screening for diabetes mellitus: Secondary | ICD-10-CM

## 2023-05-16 MED ORDER — AMLODIPINE BESYLATE 5 MG PO TABS: 5.0000 mg | ORAL_TABLET | Freq: Every day | ORAL | 3 refills | Status: DC

## 2023-05-16 NOTE — Patient Instructions (Signed)
Medication Instructions Your physician has recommended you make the following change in your medication:  Start Amlodipine 5 mg once daily  *If you need a refill on your cardiac medications before your next appointment, please call your pharmacy*   Lab Work: Your physician recommends that you return for lab work in: Today for A1C, BMP, CBC and Direct LDL  If you have labs (blood work) drawn today and your tests are completely normal, you will receive your results only by: MyChart Message (if you have MyChart) OR A paper copy in the mail If you have any lab test that is abnormal or we need to change your treatment, we will call you to review the results.   Testing/Procedures: NONE   Follow-Up: At [facility], you and your health needs are our priority.  As part of our continuing mission to provide you with exceptional heart care, we have created designated Provider Care Teams.  These Care Teams include your primary Cardiologist (physician) and Advanced Practice Providers (APPs -  Physician Assistants and Nurse Practitioners) who all work together to provide you with the care you need, when you need it.  We recommend signing up for the patient portal called "MyChart".  Sign up information is provided on this After Visit Summary.  MyChart is used to connect with patients for Virtual Visits (Telemedicine).  Patients are able to view lab/test results, encounter notes, upcoming appointments, etc.  Non-urgent messages can be sent to your provider as well.   To learn more about what you can do with MyChart, go to [email or web address].    Your next appointment:   6 month(s)  Provider:   Wallis Bamberg, NP Rosalita Levan)    Other Instructions

## 2023-05-17 LAB — CBC WITH DIFFERENTIAL/PLATELET
Basophils Absolute: 0 x10E3/uL (ref 0.0–0.2)
Basos: 1 %
EOS (ABSOLUTE): 0.1 x10E3/uL (ref 0.0–0.4)
Eos: 3 %
Hematocrit: 35.3 % — ABNORMAL LOW (ref 37.5–51.0)
Hemoglobin: 11.5 g/dL — ABNORMAL LOW (ref 13.0–17.7)
Immature Grans (Abs): 0 x10E3/uL (ref 0.0–0.1)
Immature Granulocytes: 0 %
Lymphocytes Absolute: 0.7 x10E3/uL (ref 0.7–3.1)
Lymphs: 15 %
MCH: 29.2 pg (ref 26.6–33.0)
MCHC: 32.6 g/dL (ref 31.5–35.7)
MCV: 90 fL (ref 79–97)
Monocytes Absolute: 0.3 x10E3/uL (ref 0.1–0.9)
Monocytes: 7 %
Neutrophils Absolute: 3.4 x10E3/uL (ref 1.4–7.0)
Neutrophils: 74 %
Platelets: 144 x10E3/uL — ABNORMAL LOW (ref 150–450)
RBC: 3.94 x10E6/uL — ABNORMAL LOW (ref 4.14–5.80)
RDW: 13.5 % (ref 11.6–15.4)
WBC: 4.6 x10E3/uL (ref 3.4–10.8)

## 2023-05-17 LAB — BASIC METABOLIC PANEL WITH GFR
BUN/Creatinine Ratio: 16 (ref 10–24)
BUN: 56 mg/dL — ABNORMAL HIGH (ref 8–27)
CO2: 19 mmol/L — ABNORMAL LOW (ref 20–29)
Calcium: 8.8 mg/dL (ref 8.6–10.2)
Chloride: 108 mmol/L — ABNORMAL HIGH (ref 96–106)
Creatinine, Ser: 3.46 mg/dL — ABNORMAL HIGH (ref 0.76–1.27)
Glucose: 100 mg/dL — ABNORMAL HIGH (ref 70–99)
Potassium: 5 mmol/L (ref 3.5–5.2)
Sodium: 144 mmol/L (ref 134–144)
eGFR: 19 mL/min/1.73 — ABNORMAL LOW

## 2023-05-17 LAB — LDL CHOLESTEROL, DIRECT: LDL Direct: 56 mg/dL (ref 0–99)

## 2023-05-17 LAB — HEMOGLOBIN A1C
Est. average glucose Bld gHb Est-mCnc: 160 mg/dL
Hgb A1c MFr Bld: 7.2 % — ABNORMAL HIGH (ref 4.8–5.6)

## 2023-05-20 ENCOUNTER — Telehealth: Payer: Self-pay | Admitting: Emergency Medicine

## 2023-05-20 MED ORDER — CARVEDILOL 3.125 MG PO TABS: 3.1250 mg | ORAL_TABLET | Freq: Two times a day (BID) | ORAL | 3 refills | Status: DC

## 2023-05-20 NOTE — Telephone Encounter (Signed)
Results reviewed with pt as per Wallis Bamberg NP's note.  Pt verbalized understanding and had no additional questions. RX sent to pharmacy.  Routed to PCP.

## 2023-05-20 NOTE — Telephone Encounter (Signed)
-----   Message from Flossie Dibble sent at 05/19/2023 10:23 AM EST ----- Kidney function is stable. CBC reveals stable anemia. A1c is elevated at 7.2%, needs better control of his diabetes. Cholesterol is well-controlled at 56.  Will send results to Dr. Shary Decamp and [place].  Is he taking carvedilol? If not, start 3.125 mg twice daily.

## 2023-05-22 ENCOUNTER — Telehealth: Payer: Self-pay | Admitting: Cardiology

## 2023-05-22 ENCOUNTER — Other Ambulatory Visit (HOSPITAL_COMMUNITY): Payer: Self-pay

## 2023-05-22 DIAGNOSIS — I10 Essential (primary) hypertension: Secondary | ICD-10-CM

## 2023-05-22 MED ORDER — CLONIDINE HCL 0.3 MG PO TABS
0.3000 mg | ORAL_TABLET | Freq: Every day | ORAL | 0 refills | Status: DC
Start: 2023-05-22 — End: 2023-07-14
  Filled 2023-05-22: qty 45, 45d supply, fill #0

## 2023-05-22 NOTE — Telephone Encounter (Signed)
*  STAT* If patient is at the pharmacy, call can be transferred to refill team.   1. Which medications need to be refilled? (please list name of each medication and dose if known) cloNIDine (CATAPRES) 0.3 MG tablet    2. Would you like to learn more about the convenience, safety, & potential cost savings by using the [facility]?No   3. Are you open to using the [facility] No   4. Which pharmacy/location (including street and city if local pharmacy) is medication to be sent to?[facility] [address] [place], [place] - [address] [address]    5. Do they need a 30 day or 90 day supply? 90 Day Supply  Pt is currently out of medication. Pt was seen on 05/16/23 by Wallis Bamberg.

## 2023-06-03 ENCOUNTER — Other Ambulatory Visit (HOSPITAL_COMMUNITY): Payer: Self-pay

## 2023-06-20 ENCOUNTER — Encounter (INDEPENDENT_AMBULATORY_CARE_PROVIDER_SITE_OTHER): Payer: Self-pay | Admitting: Nurse Practitioner

## 2023-06-20 ENCOUNTER — Other Ambulatory Visit (INDEPENDENT_AMBULATORY_CARE_PROVIDER_SITE_OTHER): Payer: Self-pay | Admitting: Nurse Practitioner

## 2023-06-20 ENCOUNTER — Ambulatory Visit (INDEPENDENT_AMBULATORY_CARE_PROVIDER_SITE_OTHER): Payer: PPO

## 2023-06-20 ENCOUNTER — Ambulatory Visit (INDEPENDENT_AMBULATORY_CARE_PROVIDER_SITE_OTHER): Payer: PPO | Admitting: Nurse Practitioner

## 2023-06-20 VITALS — BP 123/70 | HR 70 | Resp 16 | Wt 223.8 lb

## 2023-06-20 DIAGNOSIS — I739 Peripheral vascular disease, unspecified: Secondary | ICD-10-CM

## 2023-06-20 DIAGNOSIS — E1159 Type 2 diabetes mellitus with other circulatory complications: Secondary | ICD-10-CM | POA: Diagnosis not present

## 2023-06-20 DIAGNOSIS — E1122 Type 2 diabetes mellitus with diabetic chronic kidney disease: Secondary | ICD-10-CM | POA: Diagnosis not present

## 2023-06-20 DIAGNOSIS — Z794 Long term (current) use of insulin: Secondary | ICD-10-CM

## 2023-06-20 DIAGNOSIS — N184 Chronic kidney disease, stage 4 (severe): Secondary | ICD-10-CM

## 2023-06-20 DIAGNOSIS — M79604 Pain in right leg: Secondary | ICD-10-CM

## 2023-06-20 DIAGNOSIS — Z9889 Other specified postprocedural states: Secondary | ICD-10-CM

## 2023-06-20 DIAGNOSIS — I152 Hypertension secondary to endocrine disorders: Secondary | ICD-10-CM

## 2023-06-21 ENCOUNTER — Encounter (INDEPENDENT_AMBULATORY_CARE_PROVIDER_SITE_OTHER): Payer: Self-pay | Admitting: Nurse Practitioner

## 2023-06-21 NOTE — Progress Notes (Signed)
Subjective:    Patient ID: Phillip [name], male    DOB: [birth year], 68 y.o.   MRN: [id number] Chief Complaint  Patient presents with   Follow-up    Ref [facility] consult claudication    Phillip [name] is a 68 year old male who presents today for claudication-like symptoms.  He has a history of peripheral arterial disease with most recent revascularization on 09/21/2022 of the left lower extremity.  He notes that now his right lower extremity is more significant.  He denies any open wounds or ulcerations.  He denies any rest pain.  He does have some chronic kidney disease.  Today noninvasive studies show biphasic waveforms throughout the right lower extremity where it transitions to monophasic waveforms in the tibials.    Review of Systems  Cardiovascular:        Claudication  All other systems reviewed and are negative.      Objective:   Physical Exam Vitals reviewed.  HENT:     Head: Normocephalic.  Cardiovascular:     Rate and Rhythm: Normal rate.     Pulses:          Dorsalis pedis pulses are detected w/ Doppler on the right side and detected w/ Doppler on the left side.       Posterior tibial pulses are detected w/ Doppler on the right side and detected w/ Doppler on the left side.  Pulmonary:     Effort: Pulmonary effort is normal.  Skin:    General: Skin is warm and dry.  Neurological:     Mental Status: He is alert and oriented to person, place, and time.  Psychiatric:        Mood and Affect: Mood normal.        Behavior: Behavior normal.        Thought Content: Thought content normal.        Judgment: Judgment normal.     BP 123/70   Pulse 70   Resp 16   Wt 223 lb 12.8 oz (101.5 kg)   BMI 30.35 kg/m   Past Medical History:  Diagnosis Date   Abdominopelvic abscess (HCC) 09/29/2018   Abscess of appendix 09/22/2018   Acquired spondylolisthesis of lumbosacral region 03/20/2020   Anemia of chronic disease 08/18/2019   Aortic atherosclerosis (HCC)     Aortic stenosis    a.) TTE 09/15/2018: mild-mod (MPG 19); b.) TTE 03/21/2021: mild-mod (MPG 14.3); c.) TTE 04/24/2022: mild- mod (MPG 15)   Appendicitis 09/08/2018   Asthma    Benign hypertension with CKD (chronic kidney disease) stage IV (HCC) 02/24/2018   Benign prostatic hyperplasia without lower urinary tract symptoms 01/14/2018   Bilateral lower extremity edema 11/04/2018   Bradycardia 03/25/2018   Bruit of right carotid artery 07/26/2015   CAD (coronary artery disease) 2003   a.) s/p 4v CABG 2003   Cardiac murmur 07/26/2015   Chronic combined systolic and diastolic CHF (congestive heart failure) (HCC) 07/26/2015   a.) TTE 09/15/2018: EF 50-55%, mod LVH, RVE, BAE, mild-mod TR, AoV sclerosis, G1DD; b.) MPI 10/27/2019: EF <30%; c.) TTE 03/21/2021: EF 35-40%, post AK, inf HK, mod LVH, mod red RVSF, mod LAE, mod Aov sclerosis, G2DD; c.) TTE 04/24/2022: EF 35-40%, post AK, glok HK, mod LVE, mod red RVSF, mild-mod MR, Aov sclerosis, G3DD   Chronic idiopathic constipation 11/02/2018   Chronic low back pain without sciatica 12/02/2018   Chronic pain of right hip 03/31/2020   CKD (chronic kidney disease) stage 4, GFR 15-29 ml/min (  HCC) 10/07/2018   Coronary artery disease    Dysphagia 07/26/2015   Erectile dysfunction 07/14/2017   Essential hypertension 02/24/2018   Foot ulcer, left (HCC)    GERD (gastroesophageal reflux disease)    Hematuria 07/27/2015   History of marijuana use    Hyperlipidemia 07/26/2015   Hypertension    Hypothyroidism 11/14/2015   Insomnia 11/02/2018   Lumbar spondylosis 03/20/2020   Malaise and fatigue 03/25/2018   Myocardial infarction due to demand ischemia (HCC) 09/26/2020   a.) Type II NSTEMI; b.) troponins were trended 0.54 --> 0.56 --> 0.52 ng/mL   Non-compliance 05/05/2019   Osseous and subluxation stenosis of intervertebral foramina of lumbar region 03/20/2020   Osteoarthritis 10/06/2019   Peripheral vascular disease (HCC)    Personal history of  tobacco use, presenting hazards to health 09/27/2020   Polyneuropathy in diabetes (HCC) 05/07/2019   Pulmonary HTN (HCC)    a.) TTE 05/12/2019: PASP 71; b.) TTE 09/27/2020: PASP >70; c.) TTE 03/21/2021: RVSP 43; d.) TTE 04/24/2022: RVSP 80.6   Puncture wound of right hip 04/02/2018   PVD (peripheral vascular disease) with claudication (HCC)    a.) s/p PTA 03/02/2018 - balloon angioplasty LEFT below knee popliteal artery; b.) s/p PTA 09/30/2022: baloon angioplasty LEFT tibioperoneal trunck, most proximal peroneal artery, and LEFT popliteal artery.   S/P CABG x 4 2003   Sleep apnea 07/26/2015   a.) does not require nocturnal PAP therapy   Subacute osteomyelitis of left foot (HCC) 03/25/2018   Thrombocytopenia (HCC)    Type 2 diabetes mellitus with stage 4 chronic kidney disease, with long-term current use of insulin (HCC) 07/26/2015   Vitamin B12 deficiency 06/16/2019   Vitamin D deficiency 05/07/2018    Social History   Socioeconomic History   Marital status: Married    Spouse name: Not on file   Number of children: Not on file   Years of education: Not on file   Highest education level: Not on file  Occupational History   Not on file  Tobacco Use   Smoking status: Former    Current packs/day: 0.00    Average packs/day: 0.5 packs/day for 25.0 years (12.5 ttl pk-yrs)    Types: Cigarettes    Start date: 81    Quit date: 2003    Years since quitting: 22.0   Smokeless tobacco: Never  Vaping Use   Vaping status: Never Used  Substance and Sexual Activity   Alcohol use: Not Currently    Comment: 2003   Drug use: Not Currently    Types: Marijuana   Sexual activity: Not on file  Other Topics Concern   Not on file  Social History Narrative   Not on file   Social Drivers of Health   Financial Resource Strain: Low Risk  (09/26/2020)   Received from [facility], [facility]   Overall Financial Resource Strain (CARDIA)    Difficulty of Paying Living Expenses: Not hard  at all  Food Insecurity: No Food Insecurity (09/26/2020)   Received from [facility], [facility]   Hunger Vital Sign    Worried About Running Out of Food in the Last Year: Never true    Ran Out of Food in the Last Year: Never true  Transportation Needs: No Transportation Needs (09/26/2020)   Received from [facility], [facility]   [facility] - Transportation    Lack of Transportation (Medical): No    Lack of Transportation (Non-Medical): No  Physical Activity: Inactive (  09/26/2020)   Received from [facility], [facility]   Exercise Vital Sign    Days of Exercise per Week: 0 days    Minutes of Exercise per Session: 0 min  Stress: No Stress Concern Present (09/26/2020)   Received from [facility], [facility] - Occupational Stress Questionnaire    Feeling of Stress : Not at all  Social Connections: Socially Integrated (09/26/2020)   Received from [facility], [facility]   Social Connection and Isolation Panel [NHANES]    Frequency of Communication with Friends and Family: More than three times a week    Frequency of Social Gatherings with Friends and Family: Three times a week    Attends Religious Services: 1 to 4 times per year    Active Member of Clubs or Organizations: Yes    Attends Banker Meetings: 1 to 4 times per year    Marital Status: Married  Catering manager Violence: Not At Risk (09/26/2020)   Received from [facility], [facility]   Humiliation, Afraid, Rape, and Kick questionnaire    Fear of Current or Ex-Partner: No    Emotionally Abused: No    Physically Abused: No    Sexually Abused: No    Past Surgical History:  Procedure Laterality Date   AMPUTATION TOE Left 03/13/2018   Procedure: AMPUTATION TOE-MPJ;  Surgeon: Gwyneth Revels, DPM;  Location: [facility] ORS;  Service: Podiatry;  Laterality: Left;   ANGIOPLASTY     CARDIAC CATHETERIZATION     CORONARY ARTERY BYPASS  GRAFT N/A 2003   LAPAROSCOPIC APPENDECTOMY N/A 09/08/2018   Procedure: APPENDECTOMY LAPAROSCOPIC;  Surgeon: Leafy Ro, MD;  Location: [facility] ORS;  Service: General;  Laterality: N/A;   LOWER EXTREMITY ANGIOGRAPHY Left 03/02/2018   Procedure: LOWER EXTREMITY ANGIOGRAPHY;  Surgeon: Annice Needy, MD;  Location: [facility] INVASIVE CV LAB;  Service: Cardiovascular;  Laterality: Left;   LOWER EXTREMITY ANGIOGRAPHY Left 09/30/2022   Procedure: Lower Extremity Angiography;  Surgeon: Annice Needy, MD;  Location: [facility] INVASIVE CV LAB;  Service: Cardiovascular;  Laterality: Left;   ROTATOR CUFF REPAIR Left     Family History  Problem Relation Age of Onset   Hyperlipidemia Mother    Hypertension Mother    Diabetes Mother    Heart attack Brother    Heart disease Brother    Lung disease Father     No Known Allergies     Latest Ref Rng & Units 05/16/2023    2:29 PM 10/21/2022    8:47 AM 11/10/2020    4:48 AM  CBC  WBC 3.4 - 10.8 x10E3/uL 4.6  5.8  7.4   Hemoglobin 13.0 - 17.7 g/dL 99.3  71.6  96.7   Hematocrit 37.5 - 51.0 % 35.3  34.5  30.8   Platelets 150 - 450 x10E3/uL 144  147  113       CMP     Component Value Date/Time   NA 144 05/16/2023 1429   NA 136 08/30/2013 1025   K 5.0 05/16/2023 1429   K 3.7 08/30/2013 1025   CL 108 (H) 05/16/2023 1429   CL 101 08/30/2013 1025   CO2 19 (L) 05/16/2023 1429   CO2 31 08/30/2013 1025   GLUCOSE 100 (H) 05/16/2023 1429   GLUCOSE 147 (H) 10/21/2022 0847   GLUCOSE 310 (H) 08/30/2013 1025   BUN 56 (H) 05/16/2023 1429   BUN 17 08/30/2013 1025  CREATININE 3.46 (H) 05/16/2023 1429   CREATININE 1.15 08/30/2013 1025   CALCIUM 8.8 05/16/2023 1429   CALCIUM 9.4 08/30/2013 1025   PROT 7.2 11/07/2020 0653   PROT 7.2 08/10/2019 1043   ALBUMIN 3.2 (L) 11/07/2020 0653   ALBUMIN 3.9 08/10/2019 1043   AST 15 11/07/2020 0653   ALT 11 11/07/2020 0653   ALKPHOS 61 11/07/2020 0653   BILITOT 1.4 (H) 11/07/2020 0653   BILITOT 0.7 08/10/2019 1043   EGFR  19 (L) 05/16/2023 1429   GFRNONAA 17 (L) 10/21/2022 0847   GFRNONAA >60 08/30/2013 1025     No results found.     Assessment & Plan:   1. Peripheral arterial disease with history of revascularization (HCC) (Primary) Today noninvasive studies show that he has monophasic tibial vessel waveforms.  Currently his symptoms stop it claudication and there is no rest pain and there has not been development of ulcerations.  We discussed the patient undergoing angiogram as he has done previously on his left lower extremity.  However complicating this is his reduced kidney function.  We will have the patient begin to walk and exercise and attempt to improve his claudication symptoms.  He will return in 6 weeks.  He also has upcoming visit with his nephrologist and we will discuss possible intervention options if he has had some improvement in his studies.  2. Type 2 diabetes mellitus with stage 4 chronic kidney disease, with long-term current use of insulin (HCC) Continue hypoglycemic medications as already ordered, these medications have been reviewed and there are no changes at this time.  Hgb A1C to be monitored as already arranged by primary service  3. Hypertension associated with diabetes (HCC) Continue antihypertensive medications as already ordered, these medications have been reviewed and there are no changes at this time.  4. CKD (chronic kidney disease) stage 4, GFR 15-29 ml/min (HCC) The patient does have a reduced GFR of 19, as well as an elevated creatinine.  Given this any sort of angiogram will also likely worsen the patient's kidney function.  Given that his symptoms are not limb threatening but more so claudication make sure I have recommended that the patient attempt more conservative measures of treatment versus undergoing angiogram immediately as noted above.   Current Outpatient Medications on File Prior to Visit  Medication Sig Dispense Refill   acetaminophen (TYLENOL) 500 MG  tablet Take 1,000 mg by mouth every 6 (six) hours as needed for moderate pain or headache.      amLODipine (NORVASC) 5 MG tablet Take 1 tablet (5 mg total) by mouth daily. 90 tablet 3   Blood Glucose Monitoring Suppl (TGT BLOOD GLUCOSE MONITORING) w/Device KIT 3 (three) times daily.     bumetanide (BUMEX) 1 MG tablet Take 1 mg by mouth every morning. And prn for swelling     carvedilol (COREG) 3.125 MG tablet Take 1 tablet (3.125 mg total) by mouth 2 (two) times daily. 180 tablet 3   cloNIDine (CATAPRES) 0.3 MG tablet Take 1 tablet (0.3 mg total) by mouth at bedtime. Must keep 07/01/23 appt for additional refills 45 tablet 0   clopidogrel (PLAVIX) 75 MG tablet Take 1 tablet (75 mg total) by mouth daily. 90 tablet 3   gabapentin (NEURONTIN) 600 MG tablet Take 600 mg by mouth 2 (two) times daily.     insulin detemir (LEVEMIR) 100 unit/ml SOLN Inject 10-20 Units into the skin at bedtime as needed for high blood sugar. Took half dose  isosorbide mononitrate (IMDUR) 120 MG 24 hr tablet Take 1 tablet (120 mg total) by mouth daily. (Patient taking differently: Take 120 mg by mouth every morning.) 90 tablet 3   Lancets MISC 1 each by miscellaneous route 2 (two) times a day.     latanoprost (XALATAN) 0.005 % ophthalmic solution Place 1 drop into both eyes at bedtime.     levothyroxine (SYNTHROID) 50 MCG tablet Take 50 mcg by mouth daily.     losartan (COZAAR) 25 MG tablet Take 1 tablet (25 mg total) by mouth daily. (Patient taking differently: Take 25 mg by mouth every morning.)     lovastatin (MEVACOR) 40 MG tablet Take 40 mg by mouth at bedtime.     nitroGLYCERIN (NITROSTAT) 0.4 MG SL tablet Place 1 tablet (0.4 mg total) under the tongue every 5 (five) minutes as needed for chest pain. 25 tablet 6   tamsulosin (FLOMAX) 0.4 MG CAPS capsule Take 0.4 mg by mouth daily after breakfast.     vitamin B-12 (CYANOCOBALAMIN) 500 MCG tablet Take 500 mcg by mouth daily.      Vitamin D, Ergocalciferol, (DRISDOL)  1.25 MG (50000 UNIT) CAPS capsule Take 50,000 Units by mouth once a week. Mondays     No current facility-administered medications on file prior to visit.    There are no Patient Instructions on file for this visit. No follow-ups on file.   Georgiana Spinner, NP

## 2023-06-27 ENCOUNTER — Ambulatory Visit: Payer: PPO | Attending: Cardiology | Admitting: Cardiology

## 2023-06-27 NOTE — Progress Notes (Deleted)
 Cardiology Office Note:  .   Date:  06/27/2023  ID:  Phillip [name], DOB 1956-02-08, MRN [id number] PCP: Gordan Payment., MD  [facility] Providers Cardiologist:  Garwin Brothers, MD    History of Present Illness: Phillip [name] is a 68 y.o. male with a past medical history of combined heart failure, PVD, CAD s/p CABG x 4 2003, aortic regurgitation, CKD stage IV--follows with Dr. Janit Pagan, aortic stenosis, OSA, DM 2, dyslipidemia.  04/26/2022 EF 35 to 40% grade 3 diastolic dysfunction, severely elevated PASP, mild to moderate MR, mild calcification of aortic valve with mild to moderate aortic stenosis 10/27/2019 Lexiscan intermediate risk, no evidence of ischemia however EF is decreased, medium defect of moderate severity 2003 CABG x 4  Most recently evaluated by Dr. Tomie China on 04/12/2022, was doing stable from a cardiac perspective, and advised to follow up in 9 months.   He presents today for follow-up of his CAD.  He has been doing okay from a cardiac perspective, has been bothered by fatigue-actually states he would like to exercise more but cannot secondary to bilateral leg pain.  Was previously evaluated in April by Vascular but has not seen since. He denies chest pain, palpitations, dyspnea, pnd, orthopnea, n, v, dizziness, syncope, edema, weight gain, or early satiety.   Disposition-start amlodipine 5 mg daily, return in 6 weeks, CBC, BMET, direct LDL, A1c today, ambulatory referral to [facility].  ROS: Review of Systems  Constitutional:  Positive for malaise/fatigue.  Cardiovascular:  Positive for claudication.     Studies Reviewed: .        Cardiac Studies & Procedures     STRESS TESTS  MYOCARDIAL PERFUSION IMAGING 10/27/2019  Narrative  The left ventricular ejection fraction is severely decreased (<30%).  Nuclear stress EF: 23%.  There was no ST segment deviation noted during stress.  Defect 1: There is a medium defect of moderate severity present in the  basal inferior, mid anterior, mid inferolateral, apical anterior, apical inferior and apical lateral location.  Findings consistent with prior myocardial infarction.  This is an intermediate risk study.  No evidence of ischemia. RV is enlarged and prominent.  ECHOCARDIOGRAM  ECHOCARDIOGRAM COMPLETE 04/24/2022  Narrative ECHOCARDIOGRAM REPORT    Patient Name:   Phillip [name] Date of Exam: 04/24/2022 Medical Rec #:  [id number]        Height:       72.0 in Accession #:    [id number]       Weight:       207.6 lb Date of Birth:  30-Jan-1956       BSA:          2.165 m Patient Age:    68 years         BP:           138/72 mmHg Patient Gender: M                HR:           79 bpm. Exam Location:  [place]  Procedure: 2D Echo, Cardiac Doppler, Color Doppler and Strain Analysis  Indications:    Moderate aortic regurgitation [I35.1 (ICD-10-CM)]; Essential hypertension [I10 (ICD-10-CM)]  History:        Patient has prior history of Echocardiogram examinations, most recent 03/21/2021. CHF, CAD and Previous Myocardial Infarction, Aortic Valve Disease; Risk Factors:Dyslipidemia.  Sonographer:    Margreta Journey RDCS Referring Phys: Rito Ehrlich [facility]   Sonographer Comments: Suboptimal  subcostal window. IMPRESSIONS   1. Left ventricular ejection fraction, by estimation, is 35 to 40%. The left ventricle has moderately decreased function. The left ventricle demonstrates regional wall motion abnormalities (see scoring diagram/findings for description). The left ventricular internal cavity size was moderately dilated. Left ventricular diastolic parameters are consistent with Grade III diastolic dysfunction (restrictive). 2. Right ventricular systolic function is moderately reduced. The right ventricular size is normal. There is severely elevated pulmonary artery systolic pressure. 3. The mitral valve is degenerative. Mild to moderate mitral valve regurgitation. No evidence of mitral  stenosis. 4. Low flow AS . The aortic valve has an indeterminant number of cusps. There is mild calcification of the aortic valve. There is moderate thickening of the aortic valve. Aortic valve regurgitation is moderate. Mild to moderate aortic valve stenosis. 5. The inferior vena cava is dilated in size with <50% respiratory variability, suggesting right atrial pressure of 15 mmHg.  FINDINGS Left Ventricle: Left ventricular ejection fraction, by estimation, is 35 to 40%. The left ventricle has moderately decreased function. The left ventricle demonstrates regional wall motion abnormalities. Global longitudinal strain performed but not reported based on interpreter judgement due to suboptimal tracking. The left ventricular internal cavity size was moderately dilated. There is no left ventricular hypertrophy. Left ventricular diastolic parameters are consistent with Grade III diastolic dysfunction (restrictive).   LV Wall Scoring: The posterior wall is akinetic. The antero-lateral wall, inferior septum, entire inferior wall, apical lateral segment, and apex are hypokinetic. The entire anterior wall and anterior septum are normal.  Right Ventricle: The right ventricular size is normal. No increase in right ventricular wall thickness. Right ventricular systolic function is moderately reduced. There is severely elevated pulmonary artery systolic pressure. The tricuspid regurgitant velocity is 4.26 m/s, and with an assumed right atrial pressure of 8 mmHg, the estimated right ventricular systolic pressure is 80.6 mmHg.  Left Atrium: Left atrial size was normal in size.  Right Atrium: Right atrial size was normal in size.  Pericardium: There is no evidence of pericardial effusion.  Mitral Valve: The mitral valve is degenerative in appearance. There is moderate thickening of the anterior and posterior mitral valve leaflet(s). Mild mitral annular calcification. Mild to moderate mitral valve  regurgitation. No evidence of mitral valve stenosis.  Tricuspid Valve: The tricuspid valve is normal in structure. Tricuspid valve regurgitation is mild . No evidence of tricuspid stenosis.  Aortic Valve: Low flow AS. The aortic valve has an indeterminant number of cusps. There is mild calcification of the aortic valve. There is moderate thickening of the aortic valve. Aortic valve regurgitation is moderate. Aortic regurgitation PHT measures 383 msec. Mild to moderate aortic stenosis is present. Aortic valve mean gradient measures 15.0 mmHg. Aortic valve peak gradient measures 27.2 mmHg. Aortic valve area, by VTI measures 1.04 cm.  Pulmonic Valve: The pulmonic valve was normal in structure. Pulmonic valve regurgitation is trivial. No evidence of pulmonic stenosis.  Aorta: The aortic arch was not well visualized and the aortic root and ascending aorta are structurally normal, with no evidence of dilitation.  Venous: A systolic blunting flow pattern is recorded from the right upper pulmonary vein. The inferior vena cava is dilated in size with less than 50% respiratory variability, suggesting right atrial pressure of 15 mmHg.  IAS/Shunts: No atrial level shunt detected by color flow Doppler.   LEFT VENTRICLE PLAX 2D LVIDd:         6.30 cm   Diastology LVIDs:  5.60 cm   LV e' medial:    4.24 cm/s LV PW:         1.00 cm   LV E/e' medial:  27.6 LV IVS:        1.30 cm   LV e' lateral:   5.98 cm/s LVOT diam:     2.00 cm   LV E/e' lateral: 19.6 LV SV:         50 LV SV Index:   23 LVOT Area:     3.14 cm   RIGHT VENTRICLE            IVC RV Basal diam:  3.80 cm    IVC diam: 2.20 cm RV Mid diam:    3.10 cm RV S prime:     6.85 cm/s TAPSE (M-mode): 1.0 cm  LEFT ATRIUM             Index        RIGHT ATRIUM           Index LA diam:        4.10 cm 1.89 cm/m   RA Area:     16.20 cm LA Vol (A2C):   62.5 ml 28.87 ml/m  RA Volume:   37.40 ml  17.28 ml/m LA Vol (A4C):   75.4 ml 34.83  ml/m LA Biplane Vol: 68.7 ml 31.74 ml/m AORTIC VALVE AV Area (Vmax):    0.79 cm AV Area (Vmean):   0.79 cm AV Area (VTI):     1.04 cm AV Vmax:           261.00 cm/s AV Vmean:          185.000 cm/s AV VTI:            0.482 m AV Peak Grad:      27.2 mmHg AV Mean Grad:      15.0 mmHg LVOT Vmax:         65.90 cm/s LVOT Vmean:        46.500 cm/s LVOT VTI:          0.159 m LVOT/AV VTI ratio: 0.33 AI PHT:            383 msec  AORTA Ao Root diam: 3.60 cm Ao Asc diam:  3.70 cm  MITRAL VALVE                  TRICUSPID VALVE MV Area (PHT): 4.68 cm       TR Peak grad:   72.6 mmHg MV Decel Time: 162 msec       TR Vmax:        426.00 cm/s MR Peak grad:    69.9 mmHg MR Mean grad:    51.0 mmHg    SHUNTS MR Vmax:         418.00 cm/s  Systemic VTI:  0.16 m MR Vmean:        339.0 cm/s   Systemic Diam: 2.00 cm MR PISA:         0.57 cm MR PISA Eff ROA: 5 mm MR PISA Radius:  0.30 cm MV E velocity: 117.00 cm/s  Norman Herrlich MD Electronically signed by Norman Herrlich MD Signature Date/Time: 04/26/2022/3:26:27 PM    Final             Risk Assessment/Calculations:     No BP recorded.  {Refresh Note OR Click here to enter BP  :1}***       Physical Exam:   VS:  There  were no vitals taken for this visit.   Wt Readings from Last 3 Encounters:  06/20/23 223 lb 12.8 oz (101.5 kg)  05/16/23 204 lb (92.5 kg)  10/17/22 207 lb 3.7 oz (94 kg)     GEN: Well nourished, well developed in no acute distress NECK: No JVD; No carotid bruits CARDIAC: RRR, 2/6 systolic murmur, rubs, gallops RESPIRATORY:  Clear to auscultation without rales, wheezing or rhonchi  ABDOMEN: Soft, non-tender, non-distended EXTREMITIES:  +1 pitting edema mid tibia; No deformity   ASSESSMENT AND PLAN:   HFrEF -most recent EF 35 to 40%, NYHA class II, euvolemic.  GDMT is prohibited secondary to kidney dysfunction, he is on low-dose losartan 25 mg daily.  Was previously on carvedilol however is not currently on his med  list and he is uncertain if he is still taking it.  CAD-s/p CABG 2003 >>  Lexiscan in 2021 revealed intermediate risk, no evidence of ischemia however EF is decreased, medium defect of moderate severity. Stable with no anginal symptoms. No indication for ischemic evaluation.  Continue Plavix 75 mg daily, previously on carvedilol however appears he is not taking it currently.  Hypertension-blood pressure is elevated in the office today 155/52, states he gets elevated readings at home as well.  Currently only on losartan 25 mg daily and clonidine 0.3 mg, will start him on amlodipine 5 mg daily.  PAD-follows with vascular in [place], will send back to them for follow up, most bothered today by claudication.   Dyslipidemia-will check direct LDL today.  DM2-last A1c was elevated at 7.3%, will repeat his A1c for him today.  Disposition-start amlodipine 5 mg daily, return in 6 weeks, CBC, BMET, direct LDL, A1c today, ambulatory referral to [facility].      Signed, Flossie Dibble, NP

## 2023-07-01 DIAGNOSIS — R601 Generalized edema: Secondary | ICD-10-CM | POA: Insufficient documentation

## 2023-07-01 HISTORY — DX: Generalized edema: R60.1

## 2023-07-13 NOTE — Progress Notes (Signed)
 Cardiology Office Note:  .   Date:  07/14/2023  ID:  Phillip [name], DOB Apr 11, 1956, MRN [id number] PCP: Abbe Hoard., MD  [facility] Providers Cardiologist:  Nelia Balzarine, MD    History of Present Illness: Phillip [name] is a 68 y.o. male with a past medical history of combined heart failure, PVD, CAD s/p CABG x 4 2003, aortic regurgitation, CKD stage IV--follows with Dr. Alvie Jolly, aortic stenosis, OSA, DM 2, dyslipidemia.  07/02/2023 echo EF 30 to 35%, inferior and inferolateral segments are akinetic, grade 2 DD, mild MR, low gradient severe aortic stenosis, severe pulmonary hypertension 04/26/2022 EF 35 to 40% grade 3 diastolic dysfunction, severely elevated PASP, mild to moderate MR, mild calcification of aortic valve with mild to moderate aortic stenosis 10/27/2019 Lexiscan  intermediate risk, no evidence of ischemia however EF is decreased, medium defect of moderate severity 2003 CABG x 4  Most recently evaluated by Dr. Lafayette Pierre on 04/12/2022, was doing stable from a cardiac perspective, and advised to follow up in 9 months.   He presents today for follow-up of his CAD.  He has been doing okay from a cardiac perspective, has been bothered by fatigue-actually states he would like to exercise more but cannot secondary to bilateral leg pain.  Was previously evaluated in April by Vascular but has not seen since. He denies chest pain, palpitations, dyspnea, pnd, orthopnea, n, v, dizziness, syncope, edema, weight gain, or early satiety.   He was admitted to [facility] 07/01/2023 for heart failure exacerbation, worsening pedal edema and +20 pound weight gain.  Echo was obtained which revealed an EF of 30 to 35%, he was aggressively diuresed however was still volume overloaded but wanted to discharge home.  He was advised to discharge on Bumex  3 mg twice daily.  He presents today for follow-up after his heart failure admission.  Been evaluated by his PCP.  There is a lot of  confusion surrounding the medications he supposed to be on, initially states he is taking Bumex  1 mg daily, then states it was changed to 1.5 mg daily however discharge summary reveals he should be on 3 mg twice daily.  Weighing himself daily, adhering to fluid restrictions, avoiding salt intake.  He has significant pedal edema up to his knees, previously it was mid thigh and in his abdomen.  He does have rales. He denies chest pain, palpitations, pnd, orthopnea, n, v, dizziness, syncope,or early satiety.    ROS: Review of Systems  Constitutional:  Positive for malaise/fatigue.  Respiratory:  Positive for shortness of breath.   Cardiovascular:  Positive for leg swelling.  All other systems reviewed and are negative.    Studies Reviewed: .        Cardiac Studies & Procedures     STRESS TESTS  MYOCARDIAL PERFUSION IMAGING 10/27/2019  Narrative  The left ventricular ejection fraction is severely decreased (<30%).  Nuclear stress EF: 23%.  There was no ST segment deviation noted during stress.  Defect 1: There is a medium defect of moderate severity present in the basal inferior, mid anterior, mid inferolateral, apical anterior, apical inferior and apical lateral location.  Findings consistent with prior myocardial infarction.  This is an intermediate risk study.  No evidence of ischemia. RV is enlarged and prominent.  ECHOCARDIOGRAM  ECHOCARDIOGRAM COMPLETE 04/24/2022  Narrative ECHOCARDIOGRAM REPORT    Patient Name:   Phillip [name] Date of Exam: 04/24/2022 Medical Rec #:  [id number]  Height:       72.0 in Accession #:    [id number]       Weight:       207.6 lb Date of Birth:  24-Dec-1955       BSA:          2.165 m Patient Age:    65 years         BP:           138/72 mmHg Patient Gender: M                HR:           79 bpm. Exam Location:  [place]  Procedure: 2D Echo, Cardiac Doppler, Color Doppler and Strain Analysis  Indications:    Moderate aortic  regurgitation [I35.1 (ICD-10-CM)]; Essential hypertension [I10 (ICD-10-CM)]  History:        Patient has prior history of Echocardiogram examinations, most recent 03/21/2021. CHF, CAD and Previous Myocardial Infarction, Aortic Valve Disease; Risk Factors:Dyslipidemia.  Sonographer:    Erminia Hazel RDCS Referring Phys: Pollie Bring [facility]   Sonographer Comments: Suboptimal subcostal window. IMPRESSIONS   1. Left ventricular ejection fraction, by estimation, is 35 to 40%. The left ventricle has moderately decreased function. The left ventricle demonstrates regional wall motion abnormalities (see scoring diagram/findings for description). The left ventricular internal cavity size was moderately dilated. Left ventricular diastolic parameters are consistent with Grade III diastolic dysfunction (restrictive). 2. Right ventricular systolic function is moderately reduced. The right ventricular size is normal. There is severely elevated pulmonary artery systolic pressure. 3. The mitral valve is degenerative. Mild to moderate mitral valve regurgitation. No evidence of mitral stenosis. 4. Low flow AS . The aortic valve has an indeterminant number of cusps. There is mild calcification of the aortic valve. There is moderate thickening of the aortic valve. Aortic valve regurgitation is moderate. Mild to moderate aortic valve stenosis. 5. The inferior vena cava is dilated in size with <50% respiratory variability, suggesting right atrial pressure of 15 mmHg.  FINDINGS Left Ventricle: Left ventricular ejection fraction, by estimation, is 35 to 40%. The left ventricle has moderately decreased function. The left ventricle demonstrates regional wall motion abnormalities. Global longitudinal strain performed but not reported based on interpreter judgement due to suboptimal tracking. The left ventricular internal cavity size was moderately dilated. There is no left ventricular hypertrophy. Left ventricular  diastolic parameters are consistent with Grade III diastolic dysfunction (restrictive).   LV Wall Scoring: The posterior wall is akinetic. The antero-lateral wall, inferior septum, entire inferior wall, apical lateral segment, and apex are hypokinetic. The entire anterior wall and anterior septum are normal.  Right Ventricle: The right ventricular size is normal. No increase in right ventricular wall thickness. Right ventricular systolic function is moderately reduced. There is severely elevated pulmonary artery systolic pressure. The tricuspid regurgitant velocity is 4.26 m/s, and with an assumed right atrial pressure of 8 mmHg, the estimated right ventricular systolic pressure is 80.6 mmHg.  Left Atrium: Left atrial size was normal in size.  Right Atrium: Right atrial size was normal in size.  Pericardium: There is no evidence of pericardial effusion.  Mitral Valve: The mitral valve is degenerative in appearance. There is moderate thickening of the anterior and posterior mitral valve leaflet(s). Mild mitral annular calcification. Mild to moderate mitral valve regurgitation. No evidence of mitral valve stenosis.  Tricuspid Valve: The tricuspid valve is normal in structure. Tricuspid valve regurgitation is mild . No evidence of tricuspid stenosis.  Aortic Valve: Low flow AS. The aortic valve has an indeterminant number of cusps. There is mild calcification of the aortic valve. There is moderate thickening of the aortic valve. Aortic valve regurgitation is moderate. Aortic regurgitation PHT measures 383 msec. Mild to moderate aortic stenosis is present. Aortic valve mean gradient measures 15.0 mmHg. Aortic valve peak gradient measures 27.2 mmHg. Aortic valve area, by VTI measures 1.04 cm.  Pulmonic Valve: The pulmonic valve was normal in structure. Pulmonic valve regurgitation is trivial. No evidence of pulmonic stenosis.  Aorta: The aortic arch was not well visualized and the aortic  root and ascending aorta are structurally normal, with no evidence of dilitation.  Venous: A systolic blunting flow pattern is recorded from the right upper pulmonary vein. The inferior vena cava is dilated in size with less than 50% respiratory variability, suggesting right atrial pressure of 15 mmHg.  IAS/Shunts: No atrial level shunt detected by color flow Doppler.   LEFT VENTRICLE PLAX 2D LVIDd:         6.30 cm   Diastology LVIDs:         5.60 cm   LV e' medial:    4.24 cm/s LV PW:         1.00 cm   LV E/e' medial:  27.6 LV IVS:        1.30 cm   LV e' lateral:   5.98 cm/s LVOT diam:     2.00 cm   LV E/e' lateral: 19.6 LV SV:         50 LV SV Index:   23 LVOT Area:     3.14 cm   RIGHT VENTRICLE            IVC RV Basal diam:  3.80 cm    IVC diam: 2.20 cm RV Mid diam:    3.10 cm RV S prime:     6.85 cm/s TAPSE (M-mode): 1.0 cm  LEFT ATRIUM             Index        RIGHT ATRIUM           Index LA diam:        4.10 cm 1.89 cm/m   RA Area:     16.20 cm LA Vol (A2C):   62.5 ml 28.87 ml/m  RA Volume:   37.40 ml  17.28 ml/m LA Vol (A4C):   75.4 ml 34.83 ml/m LA Biplane Vol: 68.7 ml 31.74 ml/m AORTIC VALVE AV Area (Vmax):    0.79 cm AV Area (Vmean):   0.79 cm AV Area (VTI):     1.04 cm AV Vmax:           261.00 cm/s AV Vmean:          185.000 cm/s AV VTI:            0.482 m AV Peak Grad:      27.2 mmHg AV Mean Grad:      15.0 mmHg LVOT Vmax:         65.90 cm/s LVOT Vmean:        46.500 cm/s LVOT VTI:          0.159 m LVOT/AV VTI ratio: 0.33 AI PHT:            383 msec  AORTA Ao Root diam: 3.60 cm Ao Asc diam:  3.70 cm  MITRAL VALVE  TRICUSPID VALVE MV Area (PHT): 4.68 cm       TR Peak grad:   72.6 mmHg MV Decel Time: 162 msec       TR Vmax:        426.00 cm/s MR Peak grad:    69.9 mmHg MR Mean grad:    51.0 mmHg    SHUNTS MR Vmax:         418.00 cm/s  Systemic VTI:  0.16 m MR Vmean:        339.0 cm/s   Systemic Diam: 2.00 cm MR PISA:          0.57 cm MR PISA Eff ROA: 5 mm MR PISA Radius:  0.30 cm MV E velocity: 117.00 cm/s  Zoe Hinds MD Electronically signed by Zoe Hinds MD Signature Date/Time: 04/26/2022/3:26:27 PM    Final             Risk Assessment/Calculations:             Physical Exam:   VS:  BP 118/70 (BP Location: Right Arm, Patient Position: Sitting, Cuff Size: Normal)   Pulse 69   Ht 6' (1.829 m)   Wt 226 lb (102.5 kg)   SpO2 97%   BMI 30.65 kg/m    Wt Readings from Last 3 Encounters:  07/14/23 226 lb (102.5 kg)  06/20/23 223 lb 12.8 oz (101.5 kg)  05/16/23 204 lb (92.5 kg)     GEN: Well nourished, well developed in no acute distress NECK: No JVD; No carotid bruits CARDIAC: RRR, 3/6 systolic murmur, rubs, gallops RESPIRATORY:  Clear to auscultation without rales, wheezing or rhonchi  ABDOMEN: Soft, non-tender, non-distended EXTREMITIES: +3 pitting edema to knees; No deformity   ASSESSMENT AND PLAN:   HFrEF -most recent EF 30-35 %, NYHA class III, volume overloaded.  GDMT is prohibited secondary to kidney dysfunction, he was on low-dose losartan  however this was held during his most recent hospitalization secondary to AKI on CKD.  Continue Coreg  3.125 mg twice daily.  Continue Bumex  1 mg daily, may take an additional 1 mg as needed for weight gain of 3 pounds in 1 day.  Severe aortic stenosis-echo at [facility] revealed low-flow, low gradient severe aortic stenosis with a mean gradient of 20 mmHg, aortic valve area of 0.9 cm2, stroke-volume index of 23 mL/M2.  He will be seeing nephrology on Monday, depending on the outcomes of this office visit we will need to make arrangements for him to see our structural heart team.  CAD-s/p CABG 2003 >>  Lexiscan  in 2021 revealed intermediate risk, no evidence of ischemia however EF is decreased, medium defect of moderate severity. Stable with no anginal symptoms. No indication for ischemic evaluation.  Continue Plavix  75 mg daily, continue carvedilol  1.25 mg  twice daily.  Hypertension-blood pressure is elevated in the office today is controlled at 118/70, continue Norvasc  5 mg daily.  PAD-follows with vascular in [place], follow back up with him however due to his kidney dysfunction there is nothing really that can be done at this time so was advised to increase his physical activity.  Hospital discharge follow-up-much confusion surrounding the medications he is currently taking, discharge summary from the hospital is very different from the medications we have on file and he is not entirely sure.  I am going to have him come back in 1 week and bring all of his medications with him so we can do a medication reconciliation.   Disposition-BMET, proBNP, follow-up in 1 week and  bring all of your home medications with you.   Signed, Terrance Ferretti, NP

## 2023-07-14 ENCOUNTER — Ambulatory Visit: Payer: PPO | Attending: Cardiology | Admitting: Cardiology

## 2023-07-14 ENCOUNTER — Encounter: Payer: Self-pay | Admitting: Cardiology

## 2023-07-14 ENCOUNTER — Encounter: Payer: Self-pay | Admitting: *Deleted

## 2023-07-14 VITALS — BP 118/70 | HR 69 | Ht 72.0 in | Wt 226.0 lb

## 2023-07-14 DIAGNOSIS — I2510 Atherosclerotic heart disease of native coronary artery without angina pectoris: Secondary | ICD-10-CM

## 2023-07-14 DIAGNOSIS — I5020 Unspecified systolic (congestive) heart failure: Secondary | ICD-10-CM | POA: Diagnosis not present

## 2023-07-14 DIAGNOSIS — I10 Essential (primary) hypertension: Secondary | ICD-10-CM | POA: Diagnosis not present

## 2023-07-14 DIAGNOSIS — I350 Nonrheumatic aortic (valve) stenosis: Secondary | ICD-10-CM | POA: Diagnosis not present

## 2023-07-14 DIAGNOSIS — N184 Chronic kidney disease, stage 4 (severe): Secondary | ICD-10-CM

## 2023-07-14 DIAGNOSIS — Z09 Encounter for follow-up examination after completed treatment for conditions other than malignant neoplasm: Secondary | ICD-10-CM

## 2023-07-14 MED ORDER — CLONIDINE HCL 0.3 MG PO TABS: 0.3000 mg | ORAL_TABLET | Freq: Every day | ORAL | 3 refills | Status: DC

## 2023-07-14 MED ORDER — BUMETANIDE 1 MG PO TABS: 1.0000 mg | ORAL_TABLET | Freq: Every day | ORAL | 3 refills | Status: DC

## 2023-07-14 NOTE — Patient Instructions (Signed)
 Medication Instructions:  Your physician has recommended you make the following change in your medication:   START: Bumex  1 mg daily (Take an extra Bumex  for weight gain of 3 lbs per day or greater)  *If you need a refill on your cardiac medications before your next appointment, please call your pharmacy*   Lab Work: Your physician recommends that you return for lab work in:   Labs today: BMP, Pro BNP  If you have labs (blood work) drawn today and your tests are completely normal, you will receive your results only by: MyChart Message (if you have MyChart) OR A paper copy in the mail If you have any lab test that is abnormal or we need to change your treatment, we will call you to review the results.   Testing/Procedures: None   Follow-Up: At [facility], you and your health needs are our priority.  As part of our continuing mission to provide you with exceptional heart care, we have created designated Provider Care Teams.  These Care Teams include your primary Cardiologist (physician) and Advanced Practice Providers (APPs -  Physician Assistants and Nurse Practitioners) who all work together to provide you with the care you need, when you need it.  We recommend signing up for the patient portal called "MyChart".  Sign up information is provided on this After Visit Summary.  MyChart is used to connect with patients for Virtual Visits (Telemedicine).  Patients are able to view lab/test results, encounter notes, upcoming appointments, etc.  Non-urgent messages can be sent to your provider as well.   To learn more about what you can do with MyChart, go to [email or web address].    Your next appointment:   1 week(s)  Provider:   Pattricia Bores, NP [facility])    Other Instructions None

## 2023-07-15 ENCOUNTER — Telehealth: Payer: Self-pay

## 2023-07-15 ENCOUNTER — Other Ambulatory Visit: Payer: Self-pay

## 2023-07-15 LAB — BASIC METABOLIC PANEL WITH GFR
BUN/Creatinine Ratio: 21 (ref 10–24)
BUN: 97 mg/dL (ref 8–27)
CO2: 19 mmol/L — ABNORMAL LOW (ref 20–29)
Calcium: 7.5 mg/dL — ABNORMAL LOW (ref 8.6–10.2)
Chloride: 108 mmol/L — ABNORMAL HIGH (ref 96–106)
Creatinine, Ser: 4.71 mg/dL — ABNORMAL HIGH (ref 0.76–1.27)
Glucose: 116 mg/dL — ABNORMAL HIGH (ref 70–99)
Potassium: 4.5 mmol/L (ref 3.5–5.2)
Sodium: 145 mmol/L — ABNORMAL HIGH (ref 134–144)
eGFR: 13 mL/min/1.73 — ABNORMAL LOW

## 2023-07-15 LAB — PRO B NATRIURETIC PEPTIDE: NT-Pro BNP: 11269 pg/mL — ABNORMAL HIGH (ref 0–376)

## 2023-07-15 MED ORDER — BUMETANIDE 2 MG PO TABS: 2.0000 mg | ORAL_TABLET | Freq: Two times a day (BID) | ORAL | 3 refills | Status: DC

## 2023-07-15 NOTE — Telephone Encounter (Signed)
Phillip Bamberg, NP asked me to call the patient and clarify with him what dosage of Bumex he was taking. Called patient and informed him of his lab work results. Patient stated that he was taking Bumex 2 mg twice daily. Spoke with Phillip Bamberg, NP regarding the patient's dose of Bumex he stated he was taking and she said to have him take Bumex 2 mg twice daily and to take an extra tablet for weight gain of 3 lbs or greater per day. She also stated to have him continue to weigh himself and to bring all of his medicine bottles to his next office appointment. Patient verbalized understanding and had no further questions at this time.

## 2023-07-21 NOTE — Progress Notes (Unsigned)
 Cardiology Office Note:  .   Date:  07/22/2023  ID:  Phillip [name], DOB 04-Jan-1956, MRN [id number] PCP: Gordan Payment., MD   HeartCare Providers Cardiologist:  Garwin Brothers, MD    History of Present Illness: Phillip [name] is a 68 y.o. male with a past medical history of combined heart failure, PVD, CAD s/p CABG x 4 2003, aortic regurgitation, CKD stage IV--follows with Dr. Janit Pagan, aortic stenosis, OSA, DM 2, dyslipidemia.  07/02/2023 echo EF 30 to 35%, inferior and inferolateral segments are akinetic, grade 2 DD, mild MR, low gradient severe aortic stenosis, severe pulmonary hypertension 04/26/2022 EF 35 to 40% grade 3 diastolic dysfunction, severely elevated PASP, mild to moderate MR, mild calcification of aortic valve with mild to moderate aortic stenosis 10/27/2019 Lexiscan intermediate risk, no evidence of ischemia however EF is decreased, medium defect of moderate severity 2003 CABG x 4  Evaluated by Dr. Tomie China on 04/12/2022, was doing stable from a cardiac perspective, and advised to follow up in 9 months. Evaluated on 05/16/2023 bothered by fatigue and claudication >> VVS as he was previously following with them >> recommended gentle exercise.  He was admitted to [facility] 07/01/2023 for heart failure exacerbation, worsening pedal edema and +20 pound weight gain.  Echo was obtained which revealed an EF of 30 to 35%, he was aggressively diuresed however was still volume overloaded but wanted to discharge home.  He was advised to discharge on Bumex 3 mg twice daily.  Evaluated 07/14/2023 following his recent hospitalization. His heart failure was not well compensated however there was much confusion surrounding his medication regimen.  He was also to see nephrology in 4 days, we arranged for quick follow-up so we can adjust medications and slightly adjusted his diuretic.  In the interim I spoke with the DOD, Dr. Bing Matter he advised to discuss with structural heart.    He presents today for medication reconciliation. He saw nephrology, Bumex was adjusted to 1.5 tablets twice daily and they would see him back in 2 months.  We verified his medication regimen following his hospitalization.  He is not feeling great.  His heart failure is not well compensated although he is in no acute distress. His weight has continued to increase. I discussed with him the challenges of managing his heart failure that is complicated by his severe aortic stenosis and CKD. I reached out to Dr. Lynnette Caffey to see if there is any room for treatment with all of his known conditions and he is agreeable to seeing him. He denies chest pain, palpitations,  pnd, n, v, dizziness, syncope, or early satiety.    ROS: Review of Systems  Constitutional:  Positive for malaise/fatigue.  Respiratory:  Positive for shortness of breath.   Cardiovascular:  Positive for leg swelling.  All other systems reviewed and are negative.    Studies Reviewed: .        Cardiac Studies & Procedures   ______________________________________________________________________________________________   STRESS TESTS  MYOCARDIAL PERFUSION IMAGING 10/27/2019  Narrative  The left ventricular ejection fraction is severely decreased (<30%).  Nuclear stress EF: 23%.  There was no ST segment deviation noted during stress.  Defect 1: There is a medium defect of moderate severity present in the basal inferior, mid anterior, mid inferolateral, apical anterior, apical inferior and apical lateral location.  Findings consistent with prior myocardial infarction.  This is an intermediate risk study.  No evidence of ischemia. RV is enlarged and prominent.  ECHOCARDIOGRAM  ECHOCARDIOGRAM COMPLETE 04/24/2022  Narrative ECHOCARDIOGRAM REPORT    Patient Name:   Phillip [name] Date of Exam: 04/24/2022 Medical Rec #:  [id number]        Height:       72.0 in Accession #:    [id number]       Weight:       207.6  lb Date of Birth:  03-Sep-1955       BSA:          2.165 m Patient Age:    65 years         BP:           138/72 mmHg Patient Gender: M                HR:           79 bpm. Exam Location:  [place]  Procedure: 2D Echo, Cardiac Doppler, Color Doppler and Strain Analysis  Indications:    Moderate aortic regurgitation [I35.1 (ICD-10-CM)]; Essential hypertension [I10 (ICD-10-CM)]  History:        Patient has prior history of Echocardiogram examinations, most recent 03/21/2021. CHF, CAD and Previous Myocardial Infarction, Aortic Valve Disease; Risk Factors:Dyslipidemia.  Sonographer:    Margreta Journey RDCS Referring Phys: Rito Ehrlich [facility]   Sonographer Comments: Suboptimal subcostal window. IMPRESSIONS   1. Left ventricular ejection fraction, by estimation, is 35 to 40%. The left ventricle has moderately decreased function. The left ventricle demonstrates regional wall motion abnormalities (see scoring diagram/findings for description). The left ventricular internal cavity size was moderately dilated. Left ventricular diastolic parameters are consistent with Grade III diastolic dysfunction (restrictive). 2. Right ventricular systolic function is moderately reduced. The right ventricular size is normal. There is severely elevated pulmonary artery systolic pressure. 3. The mitral valve is degenerative. Mild to moderate mitral valve regurgitation. No evidence of mitral stenosis. 4. Low flow AS . The aortic valve has an indeterminant number of cusps. There is mild calcification of the aortic valve. There is moderate thickening of the aortic valve. Aortic valve regurgitation is moderate. Mild to moderate aortic valve stenosis. 5. The inferior vena cava is dilated in size with <50% respiratory variability, suggesting right atrial pressure of 15 mmHg.  FINDINGS Left Ventricle: Left ventricular ejection fraction, by estimation, is 35 to 40%. The left ventricle has moderately decreased  function. The left ventricle demonstrates regional wall motion abnormalities. Global longitudinal strain performed but not reported based on interpreter judgement due to suboptimal tracking. The left ventricular internal cavity size was moderately dilated. There is no left ventricular hypertrophy. Left ventricular diastolic parameters are consistent with Grade III diastolic dysfunction (restrictive).   LV Wall Scoring: The posterior wall is akinetic. The antero-lateral wall, inferior septum, entire inferior wall, apical lateral segment, and apex are hypokinetic. The entire anterior wall and anterior septum are normal.  Right Ventricle: The right ventricular size is normal. No increase in right ventricular wall thickness. Right ventricular systolic function is moderately reduced. There is severely elevated pulmonary artery systolic pressure. The tricuspid regurgitant velocity is 4.26 m/s, and with an assumed right atrial pressure of 8 mmHg, the estimated right ventricular systolic pressure is 80.6 mmHg.  Left Atrium: Left atrial size was normal in size.  Right Atrium: Right atrial size was normal in size.  Pericardium: There is no evidence of pericardial effusion.  Mitral Valve: The mitral valve is degenerative in appearance. There is moderate thickening of the anterior and posterior mitral valve leaflet(s). Mild mitral annular  calcification. Mild to moderate mitral valve regurgitation. No evidence of mitral valve stenosis.  Tricuspid Valve: The tricuspid valve is normal in structure. Tricuspid valve regurgitation is mild . No evidence of tricuspid stenosis.  Aortic Valve: Low flow AS. The aortic valve has an indeterminant number of cusps. There is mild calcification of the aortic valve. There is moderate thickening of the aortic valve. Aortic valve regurgitation is moderate. Aortic regurgitation PHT measures 383 msec. Mild to moderate aortic stenosis is present. Aortic valve mean gradient  measures 15.0 mmHg. Aortic valve peak gradient measures 27.2 mmHg. Aortic valve area, by VTI measures 1.04 cm.  Pulmonic Valve: The pulmonic valve was normal in structure. Pulmonic valve regurgitation is trivial. No evidence of pulmonic stenosis.  Aorta: The aortic arch was not well visualized and the aortic root and ascending aorta are structurally normal, with no evidence of dilitation.  Venous: A systolic blunting flow pattern is recorded from the right upper pulmonary vein. The inferior vena cava is dilated in size with less than 50% respiratory variability, suggesting right atrial pressure of 15 mmHg.  IAS/Shunts: No atrial level shunt detected by color flow Doppler.   LEFT VENTRICLE PLAX 2D LVIDd:         6.30 cm   Diastology LVIDs:         5.60 cm   LV e' medial:    4.24 cm/s LV PW:         1.00 cm   LV E/e' medial:  27.6 LV IVS:        1.30 cm   LV e' lateral:   5.98 cm/s LVOT diam:     2.00 cm   LV E/e' lateral: 19.6 LV SV:         50 LV SV Index:   23 LVOT Area:     3.14 cm   RIGHT VENTRICLE            IVC RV Basal diam:  3.80 cm    IVC diam: 2.20 cm RV Mid diam:    3.10 cm RV S prime:     6.85 cm/s TAPSE (M-mode): 1.0 cm  LEFT ATRIUM             Index        RIGHT ATRIUM           Index LA diam:        4.10 cm 1.89 cm/m   RA Area:     16.20 cm LA Vol (A2C):   62.5 ml 28.87 ml/m  RA Volume:   37.40 ml  17.28 ml/m LA Vol (A4C):   75.4 ml 34.83 ml/m LA Biplane Vol: 68.7 ml 31.74 ml/m AORTIC VALVE AV Area (Vmax):    0.79 cm AV Area (Vmean):   0.79 cm AV Area (VTI):     1.04 cm AV Vmax:           261.00 cm/s AV Vmean:          185.000 cm/s AV VTI:            0.482 m AV Peak Grad:      27.2 mmHg AV Mean Grad:      15.0 mmHg LVOT Vmax:         65.90 cm/s LVOT Vmean:        46.500 cm/s LVOT VTI:          0.159 m LVOT/AV VTI ratio: 0.33 AI PHT:  383 msec  AORTA Ao Root diam: 3.60 cm Ao Asc diam:  3.70 cm  MITRAL VALVE                   TRICUSPID VALVE MV Area (PHT): 4.68 cm       TR Peak grad:   72.6 mmHg MV Decel Time: 162 msec       TR Vmax:        426.00 cm/s MR Peak grad:    69.9 mmHg MR Mean grad:    51.0 mmHg    SHUNTS MR Vmax:         418.00 cm/s  Systemic VTI:  0.16 m MR Vmean:        339.0 cm/s   Systemic Diam: 2.00 cm MR PISA:         0.57 cm MR PISA Eff ROA: 5 mm MR PISA Radius:  0.30 cm MV E velocity: 117.00 cm/s  Norman Herrlich MD Electronically signed by Norman Herrlich MD Signature Date/Time: 04/26/2022/3:26:27 PM    Final          ______________________________________________________________________________________________      Risk Assessment/Calculations:             Physical Exam:   VS:  BP 130/70 (BP Location: Left Arm, Patient Position: Sitting, Cuff Size: Normal)   Pulse 62   Ht 6' (1.829 m)   Wt 230 lb (104.3 kg)   SpO2 96%   BMI 31.19 kg/m    Wt Readings from Last 3 Encounters:  07/22/23 230 lb (104.3 kg)  07/14/23 226 lb (102.5 kg)  06/20/23 223 lb 12.8 oz (101.5 kg)     GEN: Well nourished, well developed in no acute distress NECK: + JVD; No carotid bruits CARDIAC: RRR, 3/6 systolic murmur, rubs, gallops RESPIRATORY: Trace rales bilaterally ABDOMEN: Soft, non-tender, non-distended EXTREMITIES: +3 pitting edema to knees; No deformity    ASSESSMENT AND PLAN:   HFrEF -most recent EF 30-35 %, NYHA class III, volume overloaded.  GDMT is prohibited secondary to kidney dysfunction, he was on low-dose losartan however this was held during his most recent hospitalization secondary to AKI on CKD.  Further diuresing is prohibited by his severe AS. Continue Coreg 3.125 mg twice daily.  Continue Bumex 1.5 mg daily twice daily.   Severe aortic stenosis-echo at [facility] revealed low-flow, low gradient severe aortic stenosis with a mean gradient of 20 mmHg, aortic valve area of 0.9 cm2, stroke-volume index of 23 mL/M2.  Previously discussed with DOD as well as reach out to Dr. Lynnette Caffey,  will refer him to structural heart.  He is hesitant to go to [facility], does not feel there is anything else that can be offered to him however does not appear to be ready to shift to full comfort measures.    CAD-s/p CABG 2003 >>  Lexiscan in 2021 revealed intermediate risk, no evidence of ischemia however EF is decreased, medium defect of moderate severity. Stable with no anginal symptoms. No indication for ischemic evaluation.  Continue Plavix 75 mg daily, continue carvedilol 1.25 mg twice daily.  Hypertension-blood pressure is well today at 130/70, continue Norvasc 5 mg daily.  PAD-follows with vascular in [place], followed back up with him however due to his kidney dysfunction there is nothing really that can be done at this time so was advised to increase his physical activity.    Disposition- refer to Structural Heart Team. Follow back up with Dr. Tomie China   Signed, Flossie Dibble, NP

## 2023-07-22 ENCOUNTER — Ambulatory Visit: Payer: PPO | Attending: Cardiology | Admitting: Cardiology

## 2023-07-22 ENCOUNTER — Encounter: Payer: Self-pay | Admitting: Cardiology

## 2023-07-22 VITALS — BP 130/70 | HR 62 | Ht 72.0 in | Wt 230.0 lb

## 2023-07-22 DIAGNOSIS — I5020 Unspecified systolic (congestive) heart failure: Secondary | ICD-10-CM

## 2023-07-22 DIAGNOSIS — I350 Nonrheumatic aortic (valve) stenosis: Secondary | ICD-10-CM

## 2023-07-22 DIAGNOSIS — I10 Essential (primary) hypertension: Secondary | ICD-10-CM | POA: Diagnosis not present

## 2023-07-22 DIAGNOSIS — N184 Chronic kidney disease, stage 4 (severe): Secondary | ICD-10-CM | POA: Diagnosis not present

## 2023-07-22 DIAGNOSIS — I2510 Atherosclerotic heart disease of native coronary artery without angina pectoris: Secondary | ICD-10-CM

## 2023-07-24 NOTE — Progress Notes (Unsigned)
 Patient ID: Phillip [name] MRN: [id number] DOB/AGE: 69-Apr-1957 69 y.o.  Primary Care Physician:Grisso, Evlyn Courier., MD Primary Cardiologist: Revankar  CC:  Aortic valvular disease management     FOCUSED PROBLEM LIST:   AS AVA 0.9, MG 28, V-max 3.1, SVI 23, EF 30 to 35% OSH TTE January 2025 EKG sinus rhythm with IVCD CAD 4V CABG 2003 Ischemic cardiomyopathy Inferior and inferolateral akinesis, EF 30 to 35% OSH TTE January 2025 T2DM On insulin Hypertension Hyperlipidemia Aortic atherosclerosis CT abdomen pelvis 2022 CKD stage IV PAD Status post left tibioperoneal, peroneal, and popliteal drug-coated balloon angioplasty April 2024 BMI 31  I discussed the use of AI software during this interview and the patient agrees:  HPI: 2/25: The patient is a 68 year old male with the above listed medical problems referred by NP [facility] for recommendations regarding the patient's low-flow low gradient severe aortic stenosis.  The patient had been hospitalized at [facility] for heart failure exacerbation with a 20 pound weight gain.  An echocardiogram demonstrated an ejection fraction of 30 to 35% with low-flow low gradient severe aortic stenosis and severe pulmonary hypertension.  He was aggressively diuresed with Bumex and discharged home.   He is experiencing worsening shortness of breath and leg swelling, which led to a recent hospitalization. These symptoms have been present for about a month, with the shortness of breath worsening over time. He was able to mow the lawn and walk during the summer, but now he struggles with walking even short distances without becoming short of breath. Activities such as taking a shower exacerbate his shortness of breath, while brushing his teeth does not. No chest pain or discomfort.  He has a history of coronary artery disease, having undergone quadruple bypass surgery approximately twenty years ago. He was recently hospitalized for shortness of  breath and swelling, and an ultrasound at the hospital indicated a worn-out valve and weak heart muscle. His leg swelling was previously above the knee but has since improved slightly, though it remains significant.  He has a history of diabetes managed with insulin and hypertension controlled with medication. He is not currently on cholesterol medication or aspirin. He has known kidney issues and has been advised to manage his kidney health.  He lives with his wife, who also has health issues, including hip and back problems. He supports his wife in managing her health. 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    Past Medical History:  Diagnosis Date   Abdominopelvic abscess (HCC) 09/29/2018   Abscess of appendix 09/22/2018   Acquired spondylolisthesis of lumbosacral region 03/20/2020   Anemia of chronic disease 08/18/2019   Aortic atherosclerosis (HCC)    Aortic stenosis    a.) TTE 09/15/2018: mild-mod (MPG 19); b.) TTE 03/21/2021: mild-mod (MPG 14.3); c.) TTE 04/24/2022: mild- mod (MPG 15)   Appendicitis 09/08/2018   Asthma    Benign hypertension with CKD (chronic kidney disease) stage IV (HCC) 02/24/2018   Benign prostatic hyperplasia without lower urinary tract symptoms 01/14/2018   Bilateral lower extremity edema 11/04/2018   Bradycardia 03/25/2018   Bruit of right carotid artery 07/26/2015   CAD (coronary artery disease) 2003   a.) s/p 4v CABG 2003   Cardiac murmur 07/26/2015   Chronic combined systolic and diastolic CHF (congestive heart failure) (HCC) 07/26/2015   a.) TTE 09/15/2018: EF 50-55%, mod LVH, RVE,  BAE, mild-mod TR, AoV sclerosis, G1DD; b.) MPI 10/27/2019: EF <30%; c.) TTE 03/21/2021: EF 35-40%, post AK, inf HK, mod LVH, mod red RVSF, mod LAE, mod Aov sclerosis, G2DD; c.) TTE 04/24/2022: EF 35-40%, post AK, glok HK, mod LVE, mod red RVSF,  mild-mod MR, Aov sclerosis, G3DD   Chronic idiopathic constipation 11/02/2018   Chronic low back pain without sciatica 12/02/2018   Chronic pain of right hip 03/31/2020   CKD (chronic kidney disease) stage 4, GFR 15-29 ml/min (HCC) 10/07/2018   Coronary artery disease    Dysphagia 07/26/2015   Erectile dysfunction 07/14/2017   Essential hypertension 02/24/2018   Foot ulcer, left (HCC)    GERD (gastroesophageal reflux disease)    Hematuria 07/27/2015   History of marijuana use    Hyperlipidemia 07/26/2015   Hypertension    Hypothyroidism 11/14/2015   Insomnia 11/02/2018   Lumbar spondylosis 03/20/2020   Malaise and fatigue 03/25/2018   Myocardial infarction due to demand ischemia (HCC) 09/26/2020   a.) Type II NSTEMI; b.) troponins were trended 0.54 --> 0.56 --> 0.52 ng/mL   Non-compliance 05/05/2019   Osseous and subluxation stenosis of intervertebral foramina of lumbar region 03/20/2020   Osteoarthritis 10/06/2019   Peripheral vascular disease (HCC)    Personal history of tobacco use, presenting hazards to health 09/27/2020   Polyneuropathy in diabetes (HCC) 05/07/2019   Pulmonary HTN (HCC)    a.) TTE 05/12/2019: PASP 71; b.) TTE 09/27/2020: PASP >70; c.) TTE 03/21/2021: RVSP 43; d.) TTE 04/24/2022: RVSP 80.6   Puncture wound of right hip 04/02/2018   PVD (peripheral vascular disease) with claudication (HCC)    a.) s/p PTA 03/02/2018 - balloon angioplasty LEFT below knee popliteal artery; b.) s/p PTA 09/30/2022: baloon angioplasty LEFT tibioperoneal trunck, most proximal peroneal artery, and LEFT popliteal artery.   S/P CABG x 4 2003   Sleep apnea 07/26/2015   a.) does not require nocturnal PAP therapy   Subacute osteomyelitis of left foot (HCC) 03/25/2018   Thrombocytopenia (HCC)    Type 2 diabetes mellitus with stage 4 chronic kidney disease, with long-term current use of insulin (HCC) 07/26/2015   Vitamin B12 deficiency 06/16/2019   Vitamin D deficiency 05/07/2018    Past  Surgical History:  Procedure Laterality Date   AMPUTATION TOE Left 03/13/2018   Procedure: AMPUTATION TOE-MPJ;  Surgeon: Gwyneth Revels, DPM;  Location: [facility] ORS;  Service: Podiatry;  Laterality: Left;   ANGIOPLASTY     CARDIAC CATHETERIZATION     CORONARY ARTERY BYPASS GRAFT N/A 2003   LAPAROSCOPIC APPENDECTOMY N/A 09/08/2018   Procedure: APPENDECTOMY LAPAROSCOPIC;  Surgeon: Leafy Ro, MD;  Location: [facility] ORS;  Service: General;  Laterality: N/A;   LOWER EXTREMITY ANGIOGRAPHY Left 03/02/2018   Procedure: LOWER EXTREMITY ANGIOGRAPHY;  Surgeon: Annice Needy, MD;  Location: [facility] INVASIVE CV LAB;  Service: Cardiovascular;  Laterality: Left;   LOWER EXTREMITY ANGIOGRAPHY Left 09/30/2022   Procedure: Lower Extremity Angiography;  Surgeon: Annice Needy, MD;  Location: [facility] INVASIVE CV LAB;  Service: Cardiovascular;  Laterality: Left;   ROTATOR CUFF REPAIR Left     Family History  Problem Relation Age of Onset   Hyperlipidemia Mother    Hypertension Mother    Diabetes Mother    Heart attack Brother    Heart disease Brother    Lung disease Father     Social History   Socioeconomic History   Marital status: Married    Spouse name: Not on file   Number of children: Not  on file   Years of education: Not on file   Highest education level: Not on file  Occupational History   Not on file  Tobacco Use   Smoking status: Former    Current packs/day: 0.00    Average packs/day: 0.5 packs/day for 25.0 years (12.5 ttl pk-yrs)    Types: Cigarettes    Start date: 49    Quit date: 2003    Years since quitting: 22.1   Smokeless tobacco: Never  Vaping Use   Vaping status: Never Used  Substance and Sexual Activity   Alcohol use: Not Currently    Comment: 2003   Drug use: Not Currently    Types: Marijuana   Sexual activity: Not on file  Other Topics Concern   Not on file  Social History Narrative   Not on file   Social Drivers of Health   Financial Resource Strain: Low Risk   (09/26/2020)   Received from [facility], [facility]   Overall Financial Resource Strain (CARDIA)    Difficulty of Paying Living Expenses: Not hard at all  Food Insecurity: No Food Insecurity (09/26/2020)   Received from [facility], [facility]   Hunger Vital Sign    Worried About Running Out of Food in the Last Year: Never true    Ran Out of Food in the Last Year: Never true  Transportation Needs: No Transportation Needs (09/26/2020)   Received from [facility], [facility]   [facility] - Transportation    Lack of Transportation (Medical): No    Lack of Transportation (Non-Medical): No  Physical Activity: Inactive (09/26/2020)   Received from [facility], [facility]   Exercise Vital Sign    Days of Exercise per Week: 0 days    Minutes of Exercise per Session: 0 min  Stress: No Stress Concern Present (09/26/2020)   Received from [facility], [facility] Occupational [facility] - Occupational Stress Questionnaire    Feeling of Stress : Not at all  Social Connections: Socially Integrated (09/26/2020)   Received from [facility], [facility]   Social Connection and Isolation Panel [NHANES]    Frequency of Communication with Friends and Family: More than three times a week    Frequency of Social Gatherings with Friends and Family: Three times a week    Attends Religious Services: 1 to 4 times per year    Active Member of Clubs or Organizations: Yes    Attends Banker Meetings: 1 to 4 times per year    Marital Status: Married  Catering manager Violence: Not At Risk (09/26/2020)   Received from [facility], [facility]   Humiliation, Afraid, Rape, and Kick questionnaire    Fear of Current or Ex-Partner: No    Emotionally Abused: No    Physically Abused: No    Sexually Abused: No     Prior to Admission medications   Medication Sig Start Date End Date Taking? Authorizing Provider  acetaminophen  (TYLENOL) 500 MG tablet Take 1,000 mg by mouth every 6 (six) hours as needed for moderate pain or headache.     [provider]  amLODipine (NORVASC) 5 MG tablet Take 1 tablet (5 mg total) by mouth daily. 05/16/23 08/14/23  Flossie Dibble, NP  Blood Glucose Monitoring Suppl (TGT BLOOD GLUCOSE MONITORING) w/Device KIT 3 (three) times daily. 09/20/21   [provider]  bumetanide Janalyn Harder) 2  MG tablet Take 1 tablet (2 mg total) by mouth 2 (two) times daily. Take an extra tablet for weight gain of 3 lbs or greater per day. 07/15/23   Flossie Dibble, NP  carvedilol (COREG) 3.125 MG tablet Take 1 tablet (3.125 mg total) by mouth 2 (two) times daily. 05/20/23 08/18/23  Flossie Dibble, NP  cloNIDine (CATAPRES) 0.3 MG tablet Take 1 tablet (0.3 mg total) by mouth at bedtime. 07/14/23   Flossie Dibble, NP  clopidogrel (PLAVIX) 75 MG tablet Take 1 tablet (75 mg total) by mouth daily. 11/20/20   Revankar, Aundra Dubin, MD  cyclobenzaprine (FLEXERIL) 10 MG tablet Take 10 mg by mouth 3 (three) times daily as needed for muscle spasms.    [provider]  gabapentin (NEURONTIN) 600 MG tablet Take 600 mg by mouth 2 (two) times daily.    [provider]  Insulin Glargine (BASAGLAR KWIKPEN) 100 UNIT/ML Inject 7 Units into the skin at bedtime. 07/18/23 07/17/24  [provider]  isosorbide mononitrate (IMDUR) 120 MG 24 hr tablet Take 1 tablet (120 mg total) by mouth daily. 06/04/22   Revankar, Aundra Dubin, MD  Lancets MISC 1 each by miscellaneous route 2 (two) times a day. 09/20/21   [provider]  latanoprost (XALATAN) 0.005 % ophthalmic solution Place 1 drop into both eyes at bedtime. 04/15/23   [provider]  levothyroxine (SYNTHROID) 50 MCG tablet Take 50 mcg by mouth daily. 04/05/23   [provider]  nitroGLYCERIN (NITROSTAT) 0.4 MG SL tablet Place 1 tablet (0.4 mg total) under the tongue every 5 (five) minutes as needed for chest pain. 11/20/20    Revankar, Aundra Dubin, MD  tamsulosin (FLOMAX) 0.4 MG CAPS capsule Take 0.4 mg by mouth daily after breakfast. 04/19/20   [provider]  vitamin B-12 (CYANOCOBALAMIN) 500 MCG tablet Take 500 mcg by mouth daily.     [provider]  Vitamin D, Ergocalciferol, (DRISDOL) 1.25 MG (50000 UNIT) CAPS capsule Take 50,000 Units by mouth once a week. Mondays 07/13/21   [provider]    No Known Allergies  REVIEW OF SYSTEMS:  General: no fevers/chills/night sweats Eyes: no blurry vision, diplopia, or amaurosis ENT: no sore throat or hearing loss Resp: no cough, wheezing, or hemoptysis CV: no edema or palpitations GI: no abdominal pain, nausea, vomiting, diarrhea, or constipation GU: no dysuria, frequency, or hematuria Skin: no rash Neuro: no headache, numbness, tingling, or weakness of extremities Musculoskeletal: no joint pain or swelling Heme: no bleeding, DVT, or easy bruising Endo: no polydipsia or polyuria  BP 126/62   Pulse 61   Ht 6' (1.829 m)   Wt 230 lb (104.3 kg)   SpO2 95%   BMI 31.19 kg/m   PHYSICAL EXAM: GEN:  AO x 3 in no acute distress HEENT: normal Dentition: Dentures Neck: JVP normal. +2 carotid upstrokes without bruits. No thyromegaly. Lungs: equal expansion, clear bilaterally CV: Apex is discrete and nondisplaced, RRR with 3/6 SEM Abd: soft, non-tender, non-distended; no bruit; positive bowel sounds Ext: +2 edema, no ecchymoses, or cyanosis Vascular: 2+ femoral pulses, +2 right radial pulse and harvested left radial.      Skin: warm and dry without rash Neuro: CN II-XII grossly intact; motor and sensory grossly intact    DATA AND STUDIES:  EKG: EKG May 2024 demonstrates sinus rhythm with inferior infarction pattern with an IVCD  EKG Interpretation Date/Time:  Friday July 25 2023 11:56:11 EST Ventricular Rate:  62 PR Interval:  154 QRS Duration:  126 QT Interval:  442 QTC Calculation: 448 R Axis:   61  Text  Interpretation: Normal sinus rhythm Non-specific intra-ventricular conduction block Inferior infarct (cited on or before 21-Oct-2022) T wave abnormality, consider lateral ischemia When compared with ECG of 21-Oct-2022 08:53, No significant change was found Confirmed by Alverda Skeans ([id number]) on 07/25/2023 12:17:57 PM        Cardiac Studies & Procedures   ______________________________________________________________________________________________   STRESS TESTS  MYOCARDIAL PERFUSION IMAGING 10/27/2019  Narrative  The left ventricular ejection fraction is severely decreased (<30%).  Nuclear stress EF: 23%.  There was no ST segment deviation noted during stress.  Defect 1: There is a medium defect of moderate severity present in the basal inferior, mid anterior, mid inferolateral, apical anterior, apical inferior and apical lateral location.  Findings consistent with prior myocardial infarction.  This is an intermediate risk study.  No evidence of ischemia. RV is enlarged and prominent.   ECHOCARDIOGRAM  ECHOCARDIOGRAM COMPLETE 04/24/2022  Narrative ECHOCARDIOGRAM REPORT    Patient Name:   Phillip [name] Date of Exam: 04/24/2022 Medical Rec #:  [id number]        Height:       72.0 in Accession #:    [id number]       Weight:       207.6 lb Date of Birth:  [birth year]       BSA:          2.165 m Patient Age:    65 years         BP:           138/72 mmHg Patient Gender: M                HR:           79 bpm. Exam Location:  [place]  Procedure: 2D Echo, Cardiac Doppler, Color Doppler and Strain Analysis  Indications:    Moderate aortic regurgitation [I35.1 (ICD-10-CM)]; Essential hypertension [I10 (ICD-10-CM)]  History:        Patient has prior history of Echocardiogram examinations, most recent 03/21/2021. CHF, CAD and Previous Myocardial Infarction, Aortic Valve Disease; Risk Factors:Dyslipidemia.  Sonographer:    Margreta Journey RDCS Referring Phys: Rito Ehrlich  [facility]   Sonographer Comments: Suboptimal subcostal window. IMPRESSIONS   1. Left ventricular ejection fraction, by estimation, is 35 to 40%. The left ventricle has moderately decreased function. The left ventricle demonstrates regional wall motion abnormalities (see scoring diagram/findings for description). The left ventricular internal cavity size was moderately dilated. Left ventricular diastolic parameters are consistent with Grade III diastolic dysfunction (restrictive). 2. Right ventricular systolic function is moderately reduced. The right ventricular size is normal. There is severely elevated pulmonary artery systolic pressure. 3. The mitral valve is degenerative. Mild to moderate mitral valve regurgitation. No evidence of mitral stenosis. 4. Low flow AS . The aortic valve has an indeterminant number of cusps. There is mild calcification of the aortic valve. There is moderate thickening of the aortic valve. Aortic valve regurgitation is moderate. Mild to moderate aortic valve stenosis. 5. The inferior vena cava is dilated in size with <50% respiratory variability, suggesting right atrial pressure of 15 mmHg.  FINDINGS Left Ventricle: Left ventricular ejection fraction, by estimation, is 35 to 40%. The left ventricle has moderately decreased function. The left ventricle demonstrates regional wall motion abnormalities. Global longitudinal strain performed but not reported based on interpreter judgement due to suboptimal tracking. The left ventricular internal cavity size was  moderately dilated. There is no left ventricular hypertrophy. Left ventricular diastolic parameters are consistent with Grade III diastolic dysfunction (restrictive).   LV Wall Scoring: The posterior wall is akinetic. The antero-lateral wall, inferior septum, entire inferior wall, apical lateral segment, and apex are hypokinetic. The entire anterior wall and anterior septum are normal.  Right Ventricle: The  right ventricular size is normal. No increase in right ventricular wall thickness. Right ventricular systolic function is moderately reduced. There is severely elevated pulmonary artery systolic pressure. The tricuspid regurgitant velocity is 4.26 m/s, and with an assumed right atrial pressure of 8 mmHg, the estimated right ventricular systolic pressure is 80.6 mmHg.  Left Atrium: Left atrial size was normal in size.  Right Atrium: Right atrial size was normal in size.  Pericardium: There is no evidence of pericardial effusion.  Mitral Valve: The mitral valve is degenerative in appearance. There is moderate thickening of the anterior and posterior mitral valve leaflet(s). Mild mitral annular calcification. Mild to moderate mitral valve regurgitation. No evidence of mitral valve stenosis.  Tricuspid Valve: The tricuspid valve is normal in structure. Tricuspid valve regurgitation is mild . No evidence of tricuspid stenosis.  Aortic Valve: Low flow AS. The aortic valve has an indeterminant number of cusps. There is mild calcification of the aortic valve. There is moderate thickening of the aortic valve. Aortic valve regurgitation is moderate. Aortic regurgitation PHT measures 383 msec. Mild to moderate aortic stenosis is present. Aortic valve mean gradient measures 15.0 mmHg. Aortic valve peak gradient measures 27.2 mmHg. Aortic valve area, by VTI measures 1.04 cm.  Pulmonic Valve: The pulmonic valve was normal in structure. Pulmonic valve regurgitation is trivial. No evidence of pulmonic stenosis.  Aorta: The aortic arch was not well visualized and the aortic root and ascending aorta are structurally normal, with no evidence of dilitation.  Venous: A systolic blunting flow pattern is recorded from the right upper pulmonary vein. The inferior vena cava is dilated in size with less than 50% respiratory variability, suggesting right atrial pressure of 15 mmHg.  IAS/Shunts: No atrial level shunt  detected by color flow Doppler.   LEFT VENTRICLE PLAX 2D LVIDd:         6.30 cm   Diastology LVIDs:         5.60 cm   LV e' medial:    4.24 cm/s LV PW:         1.00 cm   LV E/e' medial:  27.6 LV IVS:        1.30 cm   LV e' lateral:   5.98 cm/s LVOT diam:     2.00 cm   LV E/e' lateral: 19.6 LV SV:         50 LV SV Index:   23 LVOT Area:     3.14 cm   RIGHT VENTRICLE            IVC RV Basal diam:  3.80 cm    IVC diam: 2.20 cm RV Mid diam:    3.10 cm RV S prime:     6.85 cm/s TAPSE (M-mode): 1.0 cm  LEFT ATRIUM             Index        RIGHT ATRIUM           Index LA diam:        4.10 cm 1.89 cm/m   RA Area:     16.20 cm LA Vol (A2C):   62.5 ml 28.87 ml/m  RA Volume:   37.40 ml  17.28 ml/m LA Vol (A4C):   75.4 ml 34.83 ml/m LA Biplane Vol: 68.7 ml 31.74 ml/m AORTIC VALVE AV Area (Vmax):    0.79 cm AV Area (Vmean):   0.79 cm AV Area (VTI):     1.04 cm AV Vmax:           261.00 cm/s AV Vmean:          185.000 cm/s AV VTI:            0.482 m AV Peak Grad:      27.2 mmHg AV Mean Grad:      15.0 mmHg LVOT Vmax:         65.90 cm/s LVOT Vmean:        46.500 cm/s LVOT VTI:          0.159 m LVOT/AV VTI ratio: 0.33 AI PHT:            383 msec  AORTA Ao Root diam: 3.60 cm Ao Asc diam:  3.70 cm  MITRAL VALVE                  TRICUSPID VALVE MV Area (PHT): 4.68 cm       TR Peak grad:   72.6 mmHg MV Decel Time: 162 msec       TR Vmax:        426.00 cm/s MR Peak grad:    69.9 mmHg MR Mean grad:    51.0 mmHg    SHUNTS MR Vmax:         418.00 cm/s  Systemic VTI:  0.16 m MR Vmean:        339.0 cm/s   Systemic Diam: 2.00 cm MR PISA:         0.57 cm MR PISA Eff ROA: 5 mm MR PISA Radius:  0.30 cm MV E velocity: 117.00 cm/s  Norman Herrlich MD Electronically signed by Norman Herrlich MD Signature Date/Time: 04/26/2022/3:26:27 PM    Final          ______________________________________________________________________________________________      05/16/2023:  Hemoglobin 11.5; Platelets 144 07/14/2023: BUN 97; Creatinine, Ser 4.71; NT-Pro BNP 11,269; Potassium 4.5; Sodium 145   STS RISK CALCULATOR: Pending  NHYA CLASS: 2    ASSESSMENT AND PLAN:   1. Severe aortic stenosis   2. HFrEF (heart failure with reduced ejection fraction) (HCC)   3. Type 2 diabetes mellitus with complication, with long-term current use of insulin (HCC)   4. Hypertension associated with diabetes (HCC)   5. Hyperlipidemia associated with type 2 diabetes mellitus (HCC)   6. Aortic atherosclerosis (HCC)   7. CKD stage 4 due to type 2 diabetes mellitus (HCC)   8. PAD (peripheral artery disease) (HCC)   9. BMI 31.0-31.9,adult    Aortic stenosis: The patient has developed severe symptomatic low-flow low gradient aortic stenosis and has had a heart failure admission.  He is a relatively young individual.  He does have CKD stage IV.  I think is reasonable to consider an aortic valve intervention.  We will refer him for coronary angiography and right heart catheterization.  We will limit dye usage.  We will then obtain a TAVR protocol CTA with enough time in between to allow for renal recovery.  Over then have him see cardiothoracic surgery. Heart failure with reduced ejection fraction: Goal-directed medical therapy limited by severe aortic stenosis and CKD stage IV.  Continue Bumex 2 mg twice daily for now.  Hopefully after his aortic stenosis has been treated goal-directed medical therapy can be optimized as informed by his kidney function. Type 2 diabetes mellitus: Start aspirin 81 mg, start atorvastatin 20 mg, will defer ARB and Jardiance for now. Hypertension: BP is well-controlled today. Hyperlipidemia: Start atorvastatin 20 mg daily. Aortic atherosclerosis: Start aspirin 81 mg daily and atorvastatin 20 mg daily. CKD stage IV: I think with carefully scheduled dye requiring procedures it is possible we can avoid development of end-stage renal disease. Peripheral arterial  disease: No claudication symptoms today and no evidence of critical limb ischemia.  Continue medical therapy. Elevated BMI: Hopefully after aortic valve intervention he is to get back to being active.   I have personally reviewed the patients imaging data as summarized above.  I have reviewed the natural history of aortic stenosis with the patient and family members who are present today. We have discussed the limitations of medical therapy and the poor prognosis associated with symptomatic aortic stenosis. We have also reviewed potential treatment options, including palliative medical therapy, conventional surgical aortic valve replacement, and transcatheter aortic valve replacement. We discussed treatment options in the context of this patient's specific comorbid medical conditions.   All of the patient's questions were answered today. Will make further recommendations based on the results of studies outlined above.   I spent 55 minutes reviewing all clinical data during and prior to this visit including all relevant imaging studies, laboratories, clinical information from other health systems and prior notes from both Cardiology and other specialties, interviewing the patient, conducting a complete physical examination, and coordinating care in order to formulate a comprehensive and personalized evaluation and treatment plan.   Orbie Pyo, MD  07/25/2023 12:18 PM    [facility] [address] [place], [place], [place]  [address] Phone: [phone]; Fax: [phone]

## 2023-07-24 NOTE — H&P (View-Only) (Signed)
 Patient ID: Phillip [name] MRN: [id number] DOB/AGE: 69-Apr-1957 69 y.o.  Primary Care Physician:[name], Phillip Courier., MD Primary Cardiologist: Revankar  CC:  Aortic valvular disease management     FOCUSED PROBLEM LIST:   AS AVA 0.9, MG 28, V-max 3.1, SVI 23, EF 30 to 35% OSH TTE January 2025 EKG sinus rhythm with IVCD CAD 4V CABG 2003 Ischemic cardiomyopathy Inferior and inferolateral akinesis, EF 30 to 35% OSH TTE January 2025 T2DM On insulin Hypertension Hyperlipidemia Aortic atherosclerosis CT abdomen pelvis 2022 CKD stage IV PAD Status post left tibioperoneal, peroneal, and popliteal drug-coated balloon angioplasty April 2024 BMI 31  I discussed the use of AI software during this interview and the patient agrees:  HPI: 2/25: The patient is a 68 year old male with the above listed medical problems referred by NP [facility] for recommendations regarding the patient's low-flow low gradient severe aortic stenosis.  The patient had been hospitalized at [facility] for heart failure exacerbation with a 20 pound weight gain.  An echocardiogram demonstrated an ejection fraction of 30 to 35% with low-flow low gradient severe aortic stenosis and severe pulmonary hypertension.  He was aggressively diuresed with Bumex and discharged home.   He is experiencing worsening shortness of breath and leg swelling, which led to a recent hospitalization. These symptoms have been present for about a month, with the shortness of breath worsening over time. He was able to mow the lawn and walk during the summer, but now he struggles with walking even short distances without becoming short of breath. Activities such as taking a shower exacerbate his shortness of breath, while brushing his teeth does not. No chest pain or discomfort.  He has a history of coronary artery disease, having undergone quadruple bypass surgery approximately twenty years ago. He was recently hospitalized for shortness of  breath and swelling, and an ultrasound at the hospital indicated a worn-out valve and weak heart muscle. His leg swelling was previously above the knee but has since improved slightly, though it remains significant.  He has a history of diabetes managed with insulin and hypertension controlled with medication. He is not currently on cholesterol medication or aspirin. He has known kidney issues and has been advised to manage his kidney health.  He lives with his wife, who also has health issues, including hip and back problems. He supports his wife in managing her health. 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    Past Medical History:  Diagnosis Date   Abdominopelvic abscess (HCC) 09/29/2018   Abscess of appendix 09/22/2018   Acquired spondylolisthesis of lumbosacral region 03/20/2020   Anemia of chronic disease 08/18/2019   Aortic atherosclerosis (HCC)    Aortic stenosis    a.) TTE 09/15/2018: mild-mod (MPG 19); b.) TTE 03/21/2021: mild-mod (MPG 14.3); c.) TTE 04/24/2022: mild- mod (MPG 15)   Appendicitis 09/08/2018   Asthma    Benign hypertension with CKD (chronic kidney disease) stage IV (HCC) 02/24/2018   Benign prostatic hyperplasia without lower urinary tract symptoms 01/14/2018   Bilateral lower extremity edema 11/04/2018   Bradycardia 03/25/2018   Bruit of right carotid artery 07/26/2015   CAD (coronary artery disease) 2003   a.) s/p 4v CABG 2003   Cardiac murmur 07/26/2015   Chronic combined systolic and diastolic CHF (congestive heart failure) (HCC) 07/26/2015   a.) TTE 09/15/2018: EF 50-55%, mod LVH, RVE,  BAE, mild-mod TR, AoV sclerosis, G1DD; b.) MPI 10/27/2019: EF <30%; c.) TTE 03/21/2021: EF 35-40%, post AK, inf HK, mod LVH, mod red RVSF, mod LAE, mod Aov sclerosis, G2DD; c.) TTE 04/24/2022: EF 35-40%, post AK, glok HK, mod LVE, mod red RVSF,  mild-mod MR, Aov sclerosis, G3DD   Chronic idiopathic constipation 11/02/2018   Chronic low back pain without sciatica 12/02/2018   Chronic pain of right hip 03/31/2020   CKD (chronic kidney disease) stage 4, GFR 15-29 ml/min (HCC) 10/07/2018   Coronary artery disease    Dysphagia 07/26/2015   Erectile dysfunction 07/14/2017   Essential hypertension 02/24/2018   Foot ulcer, left (HCC)    GERD (gastroesophageal reflux disease)    Hematuria 07/27/2015   History of marijuana use    Hyperlipidemia 07/26/2015   Hypertension    Hypothyroidism 11/14/2015   Insomnia 11/02/2018   Lumbar spondylosis 03/20/2020   Malaise and fatigue 03/25/2018   Myocardial infarction due to demand ischemia (HCC) 09/26/2020   a.) Type II NSTEMI; b.) troponins were trended 0.54 --> 0.56 --> 0.52 ng/mL   Non-compliance 05/05/2019   Osseous and subluxation stenosis of intervertebral foramina of lumbar region 03/20/2020   Osteoarthritis 10/06/2019   Peripheral vascular disease (HCC)    Personal history of tobacco use, presenting hazards to health 09/27/2020   Polyneuropathy in diabetes (HCC) 05/07/2019   Pulmonary HTN (HCC)    a.) TTE 05/12/2019: PASP 71; b.) TTE 09/27/2020: PASP >70; c.) TTE 03/21/2021: RVSP 43; d.) TTE 04/24/2022: RVSP 80.6   Puncture wound of right hip 04/02/2018   PVD (peripheral vascular disease) with claudication (HCC)    a.) s/p PTA 03/02/2018 - balloon angioplasty LEFT below knee popliteal artery; b.) s/p PTA 09/30/2022: baloon angioplasty LEFT tibioperoneal trunck, most proximal peroneal artery, and LEFT popliteal artery.   S/P CABG x 4 2003   Sleep apnea 07/26/2015   a.) does not require nocturnal PAP therapy   Subacute osteomyelitis of left foot (HCC) 03/25/2018   Thrombocytopenia (HCC)    Type 2 diabetes mellitus with stage 4 chronic kidney disease, with long-term current use of insulin (HCC) 07/26/2015   Vitamin B12 deficiency 06/16/2019   Vitamin D deficiency 05/07/2018    Past  Surgical History:  Procedure Laterality Date   AMPUTATION TOE Left 03/13/2018   Procedure: AMPUTATION TOE-MPJ;  Surgeon: Gwyneth Revels, DPM;  Location: [facility] ORS;  Service: Podiatry;  Laterality: Left;   ANGIOPLASTY     CARDIAC CATHETERIZATION     CORONARY ARTERY BYPASS GRAFT N/A 2003   LAPAROSCOPIC APPENDECTOMY N/A 09/08/2018   Procedure: APPENDECTOMY LAPAROSCOPIC;  Surgeon: Leafy Ro, MD;  Location: [facility] ORS;  Service: General;  Laterality: N/A;   LOWER EXTREMITY ANGIOGRAPHY Left 03/02/2018   Procedure: LOWER EXTREMITY ANGIOGRAPHY;  Surgeon: Annice Needy, MD;  Location: [facility] INVASIVE CV LAB;  Service: Cardiovascular;  Laterality: Left;   LOWER EXTREMITY ANGIOGRAPHY Left 09/30/2022   Procedure: Lower Extremity Angiography;  Surgeon: Annice Needy, MD;  Location: [facility] INVASIVE CV LAB;  Service: Cardiovascular;  Laterality: Left;   ROTATOR CUFF REPAIR Left     Family History  Problem Relation Age of Onset   Hyperlipidemia Mother    Hypertension Mother    Diabetes Mother    Heart attack Brother    Heart disease Brother    Lung disease Father     Social History   Socioeconomic History   Marital status: Married    Spouse name: Not on file   Number of children: Not  on file   Years of education: Not on file   Highest education level: Not on file  Occupational History   Not on file  Tobacco Use   Smoking status: Former    Current packs/day: 0.00    Average packs/day: 0.5 packs/day for 25.0 years (12.5 ttl pk-yrs)    Types: Cigarettes    Start date: 49    Quit date: 2003    Years since quitting: 22.1   Smokeless tobacco: Never  Vaping Use   Vaping status: Never Used  Substance and Sexual Activity   Alcohol use: Not Currently    Comment: 2003   Drug use: Not Currently    Types: Marijuana   Sexual activity: Not on file  Other Topics Concern   Not on file  Social History Narrative   Not on file   Social Drivers of Health   Financial Resource Strain: Low Risk   (09/26/2020)   Received from [facility], [facility]   Overall Financial Resource Strain (CARDIA)    Difficulty of Paying Living Expenses: Not hard at all  Food Insecurity: No Food Insecurity (09/26/2020)   Received from [facility], [facility]   Hunger Vital Sign    Worried About Running Out of Food in the Last Year: Never true    Ran Out of Food in the Last Year: Never true  Transportation Needs: No Transportation Needs (09/26/2020)   Received from [facility], [facility]   [facility] - Transportation    Lack of Transportation (Medical): No    Lack of Transportation (Non-Medical): No  Physical Activity: Inactive (09/26/2020)   Received from [facility], [facility]   Exercise Vital Sign    Days of Exercise per Week: 0 days    Minutes of Exercise per Session: 0 min  Stress: No Stress Concern Present (09/26/2020)   Received from [facility], [facility] Occupational [facility] - Occupational Stress Questionnaire    Feeling of Stress : Not at all  Social Connections: Socially Integrated (09/26/2020)   Received from [facility], [facility]   Social Connection and Isolation Panel [NHANES]    Frequency of Communication with Friends and Family: More than three times a week    Frequency of Social Gatherings with Friends and Family: Three times a week    Attends Religious Services: 1 to 4 times per year    Active Member of Clubs or Organizations: Yes    Attends Banker Meetings: 1 to 4 times per year    Marital Status: Married  Catering manager Violence: Not At Risk (09/26/2020)   Received from [facility], [facility]   Humiliation, Afraid, Rape, and Kick questionnaire    Fear of Current or Ex-Partner: No    Emotionally Abused: No    Physically Abused: No    Sexually Abused: No     Prior to Admission medications   Medication Sig Start Date End Date Taking? Authorizing Provider  acetaminophen  (TYLENOL) 500 MG tablet Take 1,000 mg by mouth every 6 (six) hours as needed for moderate pain or headache.     [provider]  amLODipine (NORVASC) 5 MG tablet Take 1 tablet (5 mg total) by mouth daily. 05/16/23 08/14/23  Flossie Dibble, NP  Blood Glucose Monitoring Suppl (TGT BLOOD GLUCOSE MONITORING) w/Device KIT 3 (three) times daily. 09/20/21   [provider]  bumetanide Janalyn Harder) 2  MG tablet Take 1 tablet (2 mg total) by mouth 2 (two) times daily. Take an extra tablet for weight gain of 3 lbs or greater per day. 07/15/23   Flossie Dibble, NP  carvedilol (COREG) 3.125 MG tablet Take 1 tablet (3.125 mg total) by mouth 2 (two) times daily. 05/20/23 08/18/23  Flossie Dibble, NP  cloNIDine (CATAPRES) 0.3 MG tablet Take 1 tablet (0.3 mg total) by mouth at bedtime. 07/14/23   Flossie Dibble, NP  clopidogrel (PLAVIX) 75 MG tablet Take 1 tablet (75 mg total) by mouth daily. 11/20/20   Revankar, Aundra Dubin, MD  cyclobenzaprine (FLEXERIL) 10 MG tablet Take 10 mg by mouth 3 (three) times daily as needed for muscle spasms.    [provider]  gabapentin (NEURONTIN) 600 MG tablet Take 600 mg by mouth 2 (two) times daily.    [provider]  Insulin Glargine (BASAGLAR KWIKPEN) 100 UNIT/ML Inject 7 Units into the skin at bedtime. 07/18/23 07/17/24  [provider]  isosorbide mononitrate (IMDUR) 120 MG 24 hr tablet Take 1 tablet (120 mg total) by mouth daily. 06/04/22   Revankar, Aundra Dubin, MD  Lancets MISC 1 each by miscellaneous route 2 (two) times a day. 09/20/21   [provider]  latanoprost (XALATAN) 0.005 % ophthalmic solution Place 1 drop into both eyes at bedtime. 04/15/23   [provider]  levothyroxine (SYNTHROID) 50 MCG tablet Take 50 mcg by mouth daily. 04/05/23   [provider]  nitroGLYCERIN (NITROSTAT) 0.4 MG SL tablet Place 1 tablet (0.4 mg total) under the tongue every 5 (five) minutes as needed for chest pain. 11/20/20    Revankar, Aundra Dubin, MD  tamsulosin (FLOMAX) 0.4 MG CAPS capsule Take 0.4 mg by mouth daily after breakfast. 04/19/20   [provider]  vitamin B-12 (CYANOCOBALAMIN) 500 MCG tablet Take 500 mcg by mouth daily.     [provider]  Vitamin D, Ergocalciferol, (DRISDOL) 1.25 MG (50000 UNIT) CAPS capsule Take 50,000 Units by mouth once a week. Mondays 07/13/21   [provider]    No Known Allergies  REVIEW OF SYSTEMS:  General: no fevers/chills/night sweats Eyes: no blurry vision, diplopia, or amaurosis ENT: no sore throat or hearing loss Resp: no cough, wheezing, or hemoptysis CV: no edema or palpitations GI: no abdominal pain, nausea, vomiting, diarrhea, or constipation GU: no dysuria, frequency, or hematuria Skin: no rash Neuro: no headache, numbness, tingling, or weakness of extremities Musculoskeletal: no joint pain or swelling Heme: no bleeding, DVT, or easy bruising Endo: no polydipsia or polyuria  BP 126/62   Pulse 61   Ht 6' (1.829 m)   Wt 230 lb (104.3 kg)   SpO2 95%   BMI 31.19 kg/m   PHYSICAL EXAM: GEN:  AO x 3 in no acute distress HEENT: normal Dentition: Dentures Neck: JVP normal. +2 carotid upstrokes without bruits. No thyromegaly. Lungs: equal expansion, clear bilaterally CV: Apex is discrete and nondisplaced, RRR with 3/6 SEM Abd: soft, non-tender, non-distended; no bruit; positive bowel sounds Ext: +2 edema, no ecchymoses, or cyanosis Vascular: 2+ femoral pulses, +2 right radial pulse and harvested left radial.      Skin: warm and dry without rash Neuro: CN II-XII grossly intact; motor and sensory grossly intact    DATA AND STUDIES:  EKG: EKG May 2024 demonstrates sinus rhythm with inferior infarction pattern with an IVCD  EKG Interpretation Date/Time:  Friday July 25 2023 11:56:11 EST Ventricular Rate:  62 PR Interval:  154 QRS Duration:  126 QT Interval:  442 QTC Calculation: 448 R Axis:   61  Text  Interpretation: Normal sinus rhythm Non-specific intra-ventricular conduction block Inferior infarct (cited on or before 21-Oct-2022) T wave abnormality, consider lateral ischemia When compared with ECG of 21-Oct-2022 08:53, No significant change was found Confirmed by Alverda Skeans ([id number]) on 07/25/2023 12:17:57 PM        Cardiac Studies & Procedures   ______________________________________________________________________________________________   STRESS TESTS  MYOCARDIAL PERFUSION IMAGING 10/27/2019  Narrative  The left ventricular ejection fraction is severely decreased (<30%).  Nuclear stress EF: 23%.  There was no ST segment deviation noted during stress.  Defect 1: There is a medium defect of moderate severity present in the basal inferior, mid anterior, mid inferolateral, apical anterior, apical inferior and apical lateral location.  Findings consistent with prior myocardial infarction.  This is an intermediate risk study.  No evidence of ischemia. RV is enlarged and prominent.   ECHOCARDIOGRAM  ECHOCARDIOGRAM COMPLETE 04/24/2022  Narrative ECHOCARDIOGRAM REPORT    Patient Name:   Phillip [name] Date of Exam: 04/24/2022 Medical Rec #:  [id number]        Height:       72.0 in Accession #:    [id number]       Weight:       207.6 lb Date of Birth:  [birth year]       BSA:          2.165 m Patient Age:    65 years         BP:           138/72 mmHg Patient Gender: M                HR:           79 bpm. Exam Location:  [place]  Procedure: 2D Echo, Cardiac Doppler, Color Doppler and Strain Analysis  Indications:    Moderate aortic regurgitation [I35.1 (ICD-10-CM)]; Essential hypertension [I10 (ICD-10-CM)]  History:        Patient has prior history of Echocardiogram examinations, most recent 03/21/2021. CHF, CAD and Previous Myocardial Infarction, Aortic Valve Disease; Risk Factors:Dyslipidemia.  Sonographer:    Margreta Journey RDCS Referring Phys: Rito Ehrlich  [facility]   Sonographer Comments: Suboptimal subcostal window. IMPRESSIONS   1. Left ventricular ejection fraction, by estimation, is 35 to 40%. The left ventricle has moderately decreased function. The left ventricle demonstrates regional wall motion abnormalities (see scoring diagram/findings for description). The left ventricular internal cavity size was moderately dilated. Left ventricular diastolic parameters are consistent with Grade III diastolic dysfunction (restrictive). 2. Right ventricular systolic function is moderately reduced. The right ventricular size is normal. There is severely elevated pulmonary artery systolic pressure. 3. The mitral valve is degenerative. Mild to moderate mitral valve regurgitation. No evidence of mitral stenosis. 4. Low flow AS . The aortic valve has an indeterminant number of cusps. There is mild calcification of the aortic valve. There is moderate thickening of the aortic valve. Aortic valve regurgitation is moderate. Mild to moderate aortic valve stenosis. 5. The inferior vena cava is dilated in size with <50% respiratory variability, suggesting right atrial pressure of 15 mmHg.  FINDINGS Left Ventricle: Left ventricular ejection fraction, by estimation, is 35 to 40%. The left ventricle has moderately decreased function. The left ventricle demonstrates regional wall motion abnormalities. Global longitudinal strain performed but not reported based on interpreter judgement due to suboptimal tracking. The left ventricular internal cavity size was  moderately dilated. There is no left ventricular hypertrophy. Left ventricular diastolic parameters are consistent with Grade III diastolic dysfunction (restrictive).   LV Wall Scoring: The posterior wall is akinetic. The antero-lateral wall, inferior septum, entire inferior wall, apical lateral segment, and apex are hypokinetic. The entire anterior wall and anterior septum are normal.  Right Ventricle: The  right ventricular size is normal. No increase in right ventricular wall thickness. Right ventricular systolic function is moderately reduced. There is severely elevated pulmonary artery systolic pressure. The tricuspid regurgitant velocity is 4.26 m/s, and with an assumed right atrial pressure of 8 mmHg, the estimated right ventricular systolic pressure is 80.6 mmHg.  Left Atrium: Left atrial size was normal in size.  Right Atrium: Right atrial size was normal in size.  Pericardium: There is no evidence of pericardial effusion.  Mitral Valve: The mitral valve is degenerative in appearance. There is moderate thickening of the anterior and posterior mitral valve leaflet(s). Mild mitral annular calcification. Mild to moderate mitral valve regurgitation. No evidence of mitral valve stenosis.  Tricuspid Valve: The tricuspid valve is normal in structure. Tricuspid valve regurgitation is mild . No evidence of tricuspid stenosis.  Aortic Valve: Low flow AS. The aortic valve has an indeterminant number of cusps. There is mild calcification of the aortic valve. There is moderate thickening of the aortic valve. Aortic valve regurgitation is moderate. Aortic regurgitation PHT measures 383 msec. Mild to moderate aortic stenosis is present. Aortic valve mean gradient measures 15.0 mmHg. Aortic valve peak gradient measures 27.2 mmHg. Aortic valve area, by VTI measures 1.04 cm.  Pulmonic Valve: The pulmonic valve was normal in structure. Pulmonic valve regurgitation is trivial. No evidence of pulmonic stenosis.  Aorta: The aortic arch was not well visualized and the aortic root and ascending aorta are structurally normal, with no evidence of dilitation.  Venous: A systolic blunting flow pattern is recorded from the right upper pulmonary vein. The inferior vena cava is dilated in size with less than 50% respiratory variability, suggesting right atrial pressure of 15 mmHg.  IAS/Shunts: No atrial level shunt  detected by color flow Doppler.   LEFT VENTRICLE PLAX 2D LVIDd:         6.30 cm   Diastology LVIDs:         5.60 cm   LV e' medial:    4.24 cm/s LV PW:         1.00 cm   LV E/e' medial:  27.6 LV IVS:        1.30 cm   LV e' lateral:   5.98 cm/s LVOT diam:     2.00 cm   LV E/e' lateral: 19.6 LV SV:         50 LV SV Index:   23 LVOT Area:     3.14 cm   RIGHT VENTRICLE            IVC RV Basal diam:  3.80 cm    IVC diam: 2.20 cm RV Mid diam:    3.10 cm RV S prime:     6.85 cm/s TAPSE (M-mode): 1.0 cm  LEFT ATRIUM             Index        RIGHT ATRIUM           Index LA diam:        4.10 cm 1.89 cm/m   RA Area:     16.20 cm LA Vol (A2C):   62.5 ml 28.87 ml/m  RA Volume:   37.40 ml  17.28 ml/m LA Vol (A4C):   75.4 ml 34.83 ml/m LA Biplane Vol: 68.7 ml 31.74 ml/m AORTIC VALVE AV Area (Vmax):    0.79 cm AV Area (Vmean):   0.79 cm AV Area (VTI):     1.04 cm AV Vmax:           261.00 cm/s AV Vmean:          185.000 cm/s AV VTI:            0.482 m AV Peak Grad:      27.2 mmHg AV Mean Grad:      15.0 mmHg LVOT Vmax:         65.90 cm/s LVOT Vmean:        46.500 cm/s LVOT VTI:          0.159 m LVOT/AV VTI ratio: 0.33 AI PHT:            383 msec  AORTA Ao Root diam: 3.60 cm Ao Asc diam:  3.70 cm  MITRAL VALVE                  TRICUSPID VALVE MV Area (PHT): 4.68 cm       TR Peak grad:   72.6 mmHg MV Decel Time: 162 msec       TR Vmax:        426.00 cm/s MR Peak grad:    69.9 mmHg MR Mean grad:    51.0 mmHg    SHUNTS MR Vmax:         418.00 cm/s  Systemic VTI:  0.16 m MR Vmean:        339.0 cm/s   Systemic Diam: 2.00 cm MR PISA:         0.57 cm MR PISA Eff ROA: 5 mm MR PISA Radius:  0.30 cm MV E velocity: 117.00 cm/s  Norman Herrlich MD Electronically signed by Norman Herrlich MD Signature Date/Time: 04/26/2022/3:26:27 PM    Final          ______________________________________________________________________________________________      05/16/2023:  Hemoglobin 11.5; Platelets 144 07/14/2023: BUN 97; Creatinine, Ser 4.71; NT-Pro BNP 11,269; Potassium 4.5; Sodium 145   STS RISK CALCULATOR: Pending  NHYA CLASS: 2    ASSESSMENT AND PLAN:   1. Severe aortic stenosis   2. HFrEF (heart failure with reduced ejection fraction) (HCC)   3. Type 2 diabetes mellitus with complication, with long-term current use of insulin (HCC)   4. Hypertension associated with diabetes (HCC)   5. Hyperlipidemia associated with type 2 diabetes mellitus (HCC)   6. Aortic atherosclerosis (HCC)   7. CKD stage 4 due to type 2 diabetes mellitus (HCC)   8. PAD (peripheral artery disease) (HCC)   9. BMI 31.0-31.9,adult    Aortic stenosis: The patient has developed severe symptomatic low-flow low gradient aortic stenosis and has had a heart failure admission.  He is a relatively young individual.  He does have CKD stage IV.  I think is reasonable to consider an aortic valve intervention.  We will refer him for coronary angiography and right heart catheterization.  We will limit dye usage.  We will then obtain a TAVR protocol CTA with enough time in between to allow for renal recovery.  Over then have him see cardiothoracic surgery. Heart failure with reduced ejection fraction: Goal-directed medical therapy limited by severe aortic stenosis and CKD stage IV.  Continue Bumex 2 mg twice daily for now.  Hopefully after his aortic stenosis has been treated goal-directed medical therapy can be optimized as informed by his kidney function. Type 2 diabetes mellitus: Start aspirin 81 mg, start atorvastatin 20 mg, will defer ARB and Jardiance for now. Hypertension: BP is well-controlled today. Hyperlipidemia: Start atorvastatin 20 mg daily. Aortic atherosclerosis: Start aspirin 81 mg daily and atorvastatin 20 mg daily. CKD stage IV: I think with carefully scheduled dye requiring procedures it is possible we can avoid development of end-stage renal disease. Peripheral arterial  disease: No claudication symptoms today and no evidence of critical limb ischemia.  Continue medical therapy. Elevated BMI: Hopefully after aortic valve intervention he is to get back to being active.   I have personally reviewed the patients imaging data as summarized above.  I have reviewed the natural history of aortic stenosis with the patient and family members who are present today. We have discussed the limitations of medical therapy and the poor prognosis associated with symptomatic aortic stenosis. We have also reviewed potential treatment options, including palliative medical therapy, conventional surgical aortic valve replacement, and transcatheter aortic valve replacement. We discussed treatment options in the context of this patient's specific comorbid medical conditions.   All of the patient's questions were answered today. Will make further recommendations based on the results of studies outlined above.   I spent 55 minutes reviewing all clinical data during and prior to this visit including all relevant imaging studies, laboratories, clinical information from other health systems and prior notes from both Cardiology and other specialties, interviewing the patient, conducting a complete physical examination, and coordinating care in order to formulate a comprehensive and personalized evaluation and treatment plan.   Orbie Pyo, MD  07/25/2023 12:18 PM    [facility] [address] [place], [place], [place]  [address] Phone: [phone]; Fax: [phone]

## 2023-07-25 ENCOUNTER — Ambulatory Visit: Payer: PPO | Attending: Internal Medicine | Admitting: Internal Medicine

## 2023-07-25 ENCOUNTER — Encounter: Payer: Self-pay | Admitting: Internal Medicine

## 2023-07-25 VITALS — BP 126/62 | HR 61 | Ht 72.0 in | Wt 230.0 lb

## 2023-07-25 DIAGNOSIS — N184 Chronic kidney disease, stage 4 (severe): Secondary | ICD-10-CM

## 2023-07-25 DIAGNOSIS — E785 Hyperlipidemia, unspecified: Secondary | ICD-10-CM

## 2023-07-25 DIAGNOSIS — E1159 Type 2 diabetes mellitus with other circulatory complications: Secondary | ICD-10-CM

## 2023-07-25 DIAGNOSIS — I152 Hypertension secondary to endocrine disorders: Secondary | ICD-10-CM

## 2023-07-25 DIAGNOSIS — Z6831 Body mass index (BMI) 31.0-31.9, adult: Secondary | ICD-10-CM

## 2023-07-25 DIAGNOSIS — I350 Nonrheumatic aortic (valve) stenosis: Secondary | ICD-10-CM | POA: Diagnosis not present

## 2023-07-25 DIAGNOSIS — Z794 Long term (current) use of insulin: Secondary | ICD-10-CM

## 2023-07-25 DIAGNOSIS — I739 Peripheral vascular disease, unspecified: Secondary | ICD-10-CM

## 2023-07-25 DIAGNOSIS — I5020 Unspecified systolic (congestive) heart failure: Secondary | ICD-10-CM | POA: Diagnosis not present

## 2023-07-25 DIAGNOSIS — E118 Type 2 diabetes mellitus with unspecified complications: Secondary | ICD-10-CM | POA: Diagnosis not present

## 2023-07-25 DIAGNOSIS — E1122 Type 2 diabetes mellitus with diabetic chronic kidney disease: Secondary | ICD-10-CM

## 2023-07-25 DIAGNOSIS — I700 Atherosclerosis of aorta: Secondary | ICD-10-CM

## 2023-07-25 DIAGNOSIS — E1169 Type 2 diabetes mellitus with other specified complication: Secondary | ICD-10-CM

## 2023-07-25 MED ORDER — ASPIRIN 81 MG PO TBEC: 81.0000 mg | DELAYED_RELEASE_TABLET | Freq: Every day | ORAL | Status: DC

## 2023-07-25 MED ORDER — ATORVASTATIN CALCIUM 20 MG PO TABS: 20.0000 mg | ORAL_TABLET | Freq: Every day | ORAL | 3 refills | Status: DC

## 2023-07-25 NOTE — Progress Notes (Signed)
 Pre Surgical Assessment: 5 M Walk Test  107M=16.57ft  5 Meter Walk Test- trial 1: 12.89 seconds 5 Meter Walk Test- trial 2: 11.35 seconds 5 Meter Walk Test- trial 3: 11.64 seconds 5 Meter Walk Test Average: 11.96 seconds

## 2023-07-25 NOTE — Patient Instructions (Addendum)
 Medication Instructions:  Your physician has recommended you make the following change in your medication:   1) START aspirin 81 mg daily 2) START atorvastatin (Lipitor) 20 mg daily  *If you need a refill on your cardiac medications before your next appointment, please call your pharmacy*  Testing/Procedures: Your physician has requested that you have a cardiac catheterization. Cardiac catheterization is used to diagnose and/or treat various heart conditions. Doctors may recommend this procedure for a number of different reasons. The most common reason is to evaluate chest pain. Chest pain can be a symptom of coronary artery disease (CAD), and cardiac catheterization can show whether plaque is narrowing or blocking your heart's arteries. This procedure is also used to evaluate the valves, as well as measure the blood flow and oxygen levels in different parts of your heart. For further information please visit [email or web address] Please follow instruction sheet, as given.   Follow-Up: Will be scheduled after your heart catheterization.  Other Instructions       Cardiac/Peripheral Catheterization   You are scheduled for a Cardiac Catheterization on Friday, February 28 with Dr. Alverda Skeans.  1. Please arrive at the [facility] (Main Entrance A) at [facility]: [address] [place], [place] [address] at 8:00 AM (This time is 2 hour(s) before your procedure to ensure your preparation).   Free valet parking service is available. You will check in at ADMITTING. The support person will be asked to wait in the waiting room.  It is OK to have someone drop you off and come back when you are ready to be discharged.        Special note: Every effort is made to have your procedure done on time. Please understand that emergencies sometimes delay scheduled procedures.  2. Diet: Do not eat solid foods after midnight.  You may have clear liquids until 5 AM the day of the procedure.  3.  Medication instructions in preparation for your procedure:   Contrast Allergy: No  On the morning of your procedure, take Aspirin 81 mg and Plavix/Clopidogrel and any morning medicines NOT listed above.  You may use sips of water.  4. Plan to go home the same day, you will only stay overnight if medically necessary. 5. You MUST have a responsible adult to drive you home. 6. An adult MUST be with you the first 24 hours after you arrive home. 7. Bring a current list of your medications, and the last time and date medication taken. 8. Bring ID and current insurance cards. 9.Please wear clothes that are easy to get on and off and wear slip-on shoes.  Thank you for allowing [place] to care for you!   -- [facility]

## 2023-07-30 ENCOUNTER — Telehealth: Payer: Self-pay | Admitting: *Deleted

## 2023-07-30 ENCOUNTER — Encounter: Payer: Self-pay | Admitting: *Deleted

## 2023-07-30 NOTE — Telephone Encounter (Addendum)
 Spoke with patient's daughter (DPR), Myer Peer and discussed early arrival 08/01/23 at 5:30 AM for hydration prior to procedure at 10:30 AM cath.  I reviewed procedure instructions with Myer Peer, told her I had been unable to get in touch with her father to review/discuss with him. Myer Peer tells me she will review instructions/pre-procedure hydration with her father. Myer Peer states her son, Harlon Flor, [phone], will be providing transportation for pt and will plan on arrival at [facility] 07/31/22 at 5:30 AM

## 2023-07-30 NOTE — Progress Notes (Deleted)
 MRN : [id number]  Phillip [name] is a 68 y.o. ([birth year]) male who presents with chief complaint of check circulation.  History of Present Illness:   The patient returns to the office for followup and review status post angiogram with intervention on 09/30/2022.   Procedure: Percutaneous transluminal angioplasty of left tibioperoneal trunk and most proximal peroneal artery with 4 mm diameter by 6 cm length Lutonix drug-coated angioplasty balloon 2.   Percutaneous transluminal angioplasty of left popliteal artery with 6 mm diameter by 8 cm length Lutonix drug-coated angioplasty balloon  The patient notes improvement in the lower extremity symptoms. No interval shortening of the patient's claudication distance or rest pain symptoms. No new ulcers or wounds have occurred since the last visit.  There have been no significant changes to the patient's overall health care.  No documented history of amaurosis fugax or recent TIA symptoms. There are no recent neurological changes noted. No documented history of DVT, PE or superficial thrombophlebitis. The patient denies recent episodes of angina or shortness of breath.   ABI's Rt=*** and Lt=***  (previous ABI's Rt=*** and Lt=***) Duplex US of the *** lower extremity arterial system shows ***  No outpatient medications have been marked as taking for the 07/31/23 encounter (Appointment) with Gilda Crease, Latina Craver, MD.    Past Medical History:  Diagnosis Date   Abdominopelvic abscess (HCC) 09/29/2018   Abscess of appendix 09/22/2018   Acquired spondylolisthesis of lumbosacral region 03/20/2020   Anemia of chronic disease 08/18/2019   Aortic atherosclerosis (HCC)    Aortic stenosis    a.) TTE 09/15/2018: mild-mod (MPG 19); b.) TTE 03/21/2021: mild-mod (MPG 14.3); c.) TTE 04/24/2022: mild- mod (MPG 15)   Appendicitis 09/08/2018   Asthma    Benign hypertension with CKD  (chronic kidney disease) stage IV (HCC) 02/24/2018   Benign prostatic hyperplasia without lower urinary tract symptoms 01/14/2018   Bilateral lower extremity edema 11/04/2018   Bradycardia 03/25/2018   Bruit of right carotid artery 07/26/2015   CAD (coronary artery disease) 2003   a.) s/p 4v CABG 2003   Cardiac murmur 07/26/2015   Chronic combined systolic and diastolic CHF (congestive heart failure) (HCC) 07/26/2015   a.) TTE 09/15/2018: EF 50-55%, mod LVH, RVE, BAE, mild-mod TR, AoV sclerosis, G1DD; b.) MPI 10/27/2019: EF <30%; c.) TTE 03/21/2021: EF 35-40%, post AK, inf HK, mod LVH, mod red RVSF, mod LAE, mod Aov sclerosis, G2DD; c.) TTE 04/24/2022: EF 35-40%, post AK, glok HK, mod LVE, mod red RVSF, mild-mod MR, Aov sclerosis, G3DD   Chronic idiopathic constipation 11/02/2018   Chronic low back pain without sciatica 12/02/2018   Chronic pain of right hip 03/31/2020   CKD (chronic kidney disease) stage 4, GFR 15-29 ml/min (HCC) 10/07/2018   Coronary artery disease    Dysphagia 07/26/2015   Erectile dysfunction 07/14/2017   Essential hypertension 02/24/2018   Foot ulcer, left (HCC)    GERD (gastroesophageal reflux disease)    Hematuria 07/27/2015   History of marijuana use    Hyperlipidemia 07/26/2015   Hypertension    Hypothyroidism 11/14/2015   Insomnia 11/02/2018  Lumbar spondylosis 03/20/2020   Malaise and fatigue 03/25/2018   Myocardial infarction due to demand ischemia (HCC) 09/26/2020   a.) Type II NSTEMI; b.) troponins were trended 0.54 --> 0.56 --> 0.52 ng/mL   Non-compliance 05/05/2019   Osseous and subluxation stenosis of intervertebral foramina of lumbar region 03/20/2020   Osteoarthritis 10/06/2019   Peripheral vascular disease (HCC)    Personal history of tobacco use, presenting hazards to health 09/27/2020   Polyneuropathy in diabetes (HCC) 05/07/2019   Pulmonary HTN (HCC)    a.) TTE 05/12/2019: PASP 71; b.) TTE 09/27/2020: PASP >70; c.) TTE 03/21/2021: RVSP 43;  d.) TTE 04/24/2022: RVSP 80.6   Puncture wound of right hip 04/02/2018   PVD (peripheral vascular disease) with claudication (HCC)    a.) s/p PTA 03/02/2018 - balloon angioplasty LEFT below knee popliteal artery; b.) s/p PTA 09/30/2022: baloon angioplasty LEFT tibioperoneal trunck, most proximal peroneal artery, and LEFT popliteal artery.   S/P CABG x 4 2003   Sleep apnea 07/26/2015   a.) does not require nocturnal PAP therapy   Subacute osteomyelitis of left foot (HCC) 03/25/2018   Thrombocytopenia (HCC)    Type 2 diabetes mellitus with stage 4 chronic kidney disease, with long-term current use of insulin (HCC) 07/26/2015   Vitamin B12 deficiency 06/16/2019   Vitamin D deficiency 05/07/2018    Past Surgical History:  Procedure Laterality Date   AMPUTATION TOE Left 03/13/2018   Procedure: AMPUTATION TOE-MPJ;  Surgeon: Gwyneth Revels, DPM;  Location: [facility] ORS;  Service: Podiatry;  Laterality: Left;   ANGIOPLASTY     CARDIAC CATHETERIZATION     CORONARY ARTERY BYPASS GRAFT N/A 2003   LAPAROSCOPIC APPENDECTOMY N/A 09/08/2018   Procedure: APPENDECTOMY LAPAROSCOPIC;  Surgeon: Leafy Ro, MD;  Location: [facility] ORS;  Service: General;  Laterality: N/A;   LOWER EXTREMITY ANGIOGRAPHY Left 03/02/2018   Procedure: LOWER EXTREMITY ANGIOGRAPHY;  Surgeon: Annice Needy, MD;  Location: [facility];  Service: Cardiovascular;  Laterality: Left;   LOWER EXTREMITY ANGIOGRAPHY Left 09/30/2022   Procedure: Lower Extremity Angiography;  Surgeon: Annice Needy, MD;  Location: [facility];  Service: Cardiovascular;  Laterality: Left;   ROTATOR CUFF REPAIR Left     Social History Social History   Tobacco Use   Smoking status: Former    Current packs/day: 0.00    Average packs/day: 0.5 packs/day for 25.0 years (12.5 ttl pk-yrs)    Types: Cigarettes    Start date: 66    Quit date: 2003    Years since quitting: 22.1   Smokeless tobacco: Never  Vaping Use   Vaping status: Never Used   Substance Use Topics   Alcohol use: Not Currently    Comment: 2003   Drug use: Not Currently    Types: Marijuana    Family History Family History  Problem Relation Age of Onset   Hyperlipidemia Mother    Hypertension Mother    Diabetes Mother    Heart attack Brother    Heart disease Brother    Lung disease Father     No Known Allergies   REVIEW OF SYSTEMS (Negative unless checked)  Constitutional: [] Weight loss  [] Fever  [] Chills Cardiac: [] Chest pain   [] Chest pressure   [] Palpitations   [] Shortness of breath when laying flat   [] Shortness of breath with exertion. Vascular:  [x] Pain in legs with walking   [] Pain in legs at rest  [] History of DVT   [] Phlebitis   [] Swelling in legs   [] Varicose  veins   [] Non-healing ulcers Pulmonary:   [] Uses home oxygen   [] Productive cough   [] Hemoptysis   [] Wheeze  [] COPD   [] Asthma Neurologic:  [] Dizziness   [] Seizures   [] History of stroke   [] History of TIA  [] Aphasia   [] Vissual changes   [] Weakness or numbness in arm   [] Weakness or numbness in leg Musculoskeletal:   [] Joint swelling   [] Joint pain   [] Low back pain Hematologic:  [] Easy bruising  [] Easy bleeding   [] Hypercoagulable state   [] Anemic Gastrointestinal:  [] Diarrhea   [] Vomiting  [] Gastroesophageal reflux/heartburn   [] Difficulty swallowing. Genitourinary:  [] Chronic kidney disease   [] Difficult urination  [] Frequent urination   [] Blood in urine Skin:  [] Rashes   [] Ulcers  Psychological:  [] History of anxiety   []  History of major depression.  Physical Examination  There were no vitals filed for this visit. There is no height or weight on file to calculate BMI. Gen: WD/WN, NAD Head: [place]/AT, No temporalis wasting.  Ear/Nose/Throat: Hearing grossly intact, nares w/o erythema or drainage Eyes: PER, EOMI, sclera nonicteric.  Neck: Supple, no masses.  No bruit or JVD.  Pulmonary:  Good air movement, no audible wheezing, no use of accessory muscles.  Cardiac: RRR, normal S1,  S2, no Murmurs. Vascular:  mild trophic changes, no open wounds Vessel Right Left  Radial Palpable Palpable  PT Not Palpable Not Palpable  DP Not Palpable Not Palpable  Gastrointestinal: soft, non-distended. No guarding/no peritoneal signs.  Musculoskeletal: M/S 5/5 throughout.  No visible deformity.  Neurologic: CN 2-12 intact. Pain and light touch intact in extremities.  Symmetrical.  Speech is fluent. Motor exam as listed above. Psychiatric: Judgment intact, Mood & affect appropriate for pt's clinical situation. Dermatologic: No rashes or ulcers noted.  No changes consistent with cellulitis.   CBC Lab Results  Component Value Date   WBC 4.6 05/16/2023   HGB 11.5 (L) 05/16/2023   HCT 35.3 (L) 05/16/2023   MCV 90 05/16/2023   PLT 144 (L) 05/16/2023    BMET    Component Value Date/Time   NA 145 (H) 07/14/2023 1412   NA 136 08/30/2013 1025   K 4.5 07/14/2023 1412   K 3.7 08/30/2013 1025   CL 108 (H) 07/14/2023 1412   CL 101 08/30/2013 1025   CO2 19 (L) 07/14/2023 1412   CO2 31 08/30/2013 1025   GLUCOSE 116 (H) 07/14/2023 1412   GLUCOSE 147 (H) 10/21/2022 0847   GLUCOSE 310 (H) 08/30/2013 1025   BUN 97 (HH) 07/14/2023 1412   BUN 17 08/30/2013 1025   CREATININE 4.71 (H) 07/14/2023 1412   CREATININE 1.15 08/30/2013 1025   CALCIUM 7.5 (L) 07/14/2023 1412   CALCIUM 9.4 08/30/2013 1025   GFRNONAA 17 (L) 10/21/2022 0847   GFRNONAA >60 08/30/2013 1025   GFRAA 27 (L) 02/22/2020 0539   GFRAA >60 08/30/2013 1025   Estimated Creatinine Clearance: 18.7 mL/min (A) (by C-G formula based on SCr of 4.71 mg/dL (H)).  COAG Lab Results  Component Value Date   INR 1.2 09/23/2018   INR 1.3 (H) 09/22/2018   INR 1.13 03/11/2018    Radiology No results found.   Assessment/Plan There are no diagnoses linked to this encounter.   Levora Dredge, MD  07/30/2023 8:20 PM

## 2023-07-30 NOTE — Telephone Encounter (Signed)
 Left message for patient to call back

## 2023-07-30 NOTE — Telephone Encounter (Addendum)
 Cardiac Catheterization scheduled at [facility] for: Friday August 01, 2023 10:30 AM Arrival time [facility] Main Entrance A at: 5:30 AM-pre-procedure hydration-okay per Dr Lynnette Caffey.  Nothing to eat after midnight prior to procedure, clear liquids until 5 AM day of procedure.  Medication instructions: -Hold:  Bumex-day before and day of procedure -per protocol eGFR < 60 (13)-okay per Dr Lynnette Caffey  Insulin-1/2 usual Insulin dose HS prior to procedure -Other usual morning medications can be taken with sips of water including aspirin 81 mg and Plavix 75 mg  Plan to go home the same day, you will only stay overnight if medically necessary.  You must have responsible adult to drive you home.  Someone must be with you the first 24 hours after you arrive home.  Left message for patient to call back-need to discuss early arrival (5:30 AM ) for pre-procedure hydration/review instructions.  Call placed to spouse phone # listed, no voicemail set up, unable to leave message.

## 2023-07-31 ENCOUNTER — Ambulatory Visit (INDEPENDENT_AMBULATORY_CARE_PROVIDER_SITE_OTHER): Payer: PPO | Admitting: Vascular Surgery

## 2023-07-31 DIAGNOSIS — I7025 Atherosclerosis of native arteries of other extremities with ulceration: Secondary | ICD-10-CM

## 2023-07-31 DIAGNOSIS — I2510 Atherosclerotic heart disease of native coronary artery without angina pectoris: Secondary | ICD-10-CM

## 2023-07-31 DIAGNOSIS — I10 Essential (primary) hypertension: Secondary | ICD-10-CM

## 2023-07-31 DIAGNOSIS — N184 Chronic kidney disease, stage 4 (severe): Secondary | ICD-10-CM

## 2023-07-31 DIAGNOSIS — E1149 Type 2 diabetes mellitus with other diabetic neurological complication: Secondary | ICD-10-CM

## 2023-07-31 DIAGNOSIS — E782 Mixed hyperlipidemia: Secondary | ICD-10-CM

## 2023-07-31 NOTE — Telephone Encounter (Signed)
 Per Dr Carlena Bjornstad flow rate for hydration 50/cc/hr.

## 2023-08-01 ENCOUNTER — Encounter (HOSPITAL_COMMUNITY)
Admission: RE | Disposition: A | Discharge status: Home / Self Care | Payer: Self-pay | Source: Home / Self Care | Attending: Internal Medicine

## 2023-08-01 ENCOUNTER — Inpatient Hospital Stay
Admit: 2023-08-01 | Discharge: 2023-08-01 | Disposition: A | Discharge status: Home / Self Care | Payer: Self-pay | Attending: Internal Medicine | Admitting: Internal Medicine

## 2023-08-01 ENCOUNTER — Encounter (HOSPITAL_COMMUNITY): Payer: Self-pay | Admitting: Internal Medicine

## 2023-08-01 ENCOUNTER — Ambulatory Visit (HOSPITAL_COMMUNITY)
Admission: RE | Admit: 2023-08-01 | Discharge: 2023-08-01 | Disposition: A | Discharge status: Home / Self Care | Payer: PPO | Attending: Internal Medicine | Admitting: Internal Medicine

## 2023-08-01 ENCOUNTER — Other Ambulatory Visit: Payer: Self-pay

## 2023-08-01 DIAGNOSIS — Z79899 Other long term (current) drug therapy: Secondary | ICD-10-CM | POA: Insufficient documentation

## 2023-08-01 DIAGNOSIS — N184 Chronic kidney disease, stage 4 (severe): Secondary | ICD-10-CM | POA: Diagnosis not present

## 2023-08-01 DIAGNOSIS — I2510 Atherosclerotic heart disease of native coronary artery without angina pectoris: Secondary | ICD-10-CM | POA: Diagnosis not present

## 2023-08-01 DIAGNOSIS — E785 Hyperlipidemia, unspecified: Secondary | ICD-10-CM | POA: Diagnosis not present

## 2023-08-01 DIAGNOSIS — Z87891 Personal history of nicotine dependence: Secondary | ICD-10-CM | POA: Insufficient documentation

## 2023-08-01 DIAGNOSIS — I350 Nonrheumatic aortic (valve) stenosis: Secondary | ICD-10-CM

## 2023-08-01 DIAGNOSIS — E1169 Type 2 diabetes mellitus with other specified complication: Secondary | ICD-10-CM | POA: Diagnosis not present

## 2023-08-01 DIAGNOSIS — Z794 Long term (current) use of insulin: Secondary | ICD-10-CM | POA: Diagnosis not present

## 2023-08-01 DIAGNOSIS — I700 Atherosclerosis of aorta: Secondary | ICD-10-CM | POA: Diagnosis not present

## 2023-08-01 DIAGNOSIS — I130 Hypertensive heart and chronic kidney disease with heart failure and stage 1 through stage 4 chronic kidney disease, or unspecified chronic kidney disease: Secondary | ICD-10-CM | POA: Diagnosis not present

## 2023-08-01 DIAGNOSIS — I2582 Chronic total occlusion of coronary artery: Secondary | ICD-10-CM | POA: Insufficient documentation

## 2023-08-01 DIAGNOSIS — E1122 Type 2 diabetes mellitus with diabetic chronic kidney disease: Secondary | ICD-10-CM | POA: Insufficient documentation

## 2023-08-01 HISTORY — PX: RIGHT/LEFT HEART CATH AND CORONARY/GRAFT ANGIOGRAPHY: CATH118267

## 2023-08-01 LAB — POCT I-STAT 7, (LYTES, BLD GAS, ICA,H+H)
Acid-base deficit: 7 mmol/L — ABNORMAL HIGH (ref 0.0–2.0)
Bicarbonate: 18.4 mmol/L — ABNORMAL LOW (ref 20.0–28.0)
Calcium, Ion: 0.89 mmol/L — CL (ref 1.15–1.40)
HCT: 28 % — ABNORMAL LOW (ref 39.0–52.0)
Hemoglobin: 9.5 g/dL — ABNORMAL LOW (ref 13.0–17.0)
O2 Saturation: 94 %
Potassium: 4.2 mmol/L (ref 3.5–5.1)
Sodium: 146 mmol/L — ABNORMAL HIGH (ref 135–145)
TCO2: 19 mmol/L — ABNORMAL LOW (ref 22–32)
pCO2 arterial: 34.2 mmHg (ref 32–48)
pH, Arterial: 7.339 — ABNORMAL LOW (ref 7.35–7.45)
pO2, Arterial: 75 mmHg — ABNORMAL LOW (ref 83–108)

## 2023-08-01 LAB — POCT I-STAT EG7
Acid-base deficit: 5 mmol/L — ABNORMAL HIGH (ref 0.0–2.0)
Acid-base deficit: 6 mmol/L — ABNORMAL HIGH (ref 0.0–2.0)
Acid-base deficit: 6 mmol/L — ABNORMAL HIGH (ref 0.0–2.0)
Bicarbonate: 19.1 mmol/L — ABNORMAL LOW (ref 20.0–28.0)
Bicarbonate: 20.1 mmol/L (ref 20.0–28.0)
Bicarbonate: 20.3 mmol/L (ref 20.0–28.0)
Calcium, Ion: 0.85 mmol/L — CL (ref 1.15–1.40)
Calcium, Ion: 0.88 mmol/L — CL (ref 1.15–1.40)
Calcium, Ion: 0.92 mmol/L — ABNORMAL LOW (ref 1.15–1.40)
HCT: 27 % — ABNORMAL LOW (ref 39.0–52.0)
HCT: 28 % — ABNORMAL LOW (ref 39.0–52.0)
HCT: 28 % — ABNORMAL LOW (ref 39.0–52.0)
Hemoglobin: 9.2 g/dL — ABNORMAL LOW (ref 13.0–17.0)
Hemoglobin: 9.5 g/dL — ABNORMAL LOW (ref 13.0–17.0)
Hemoglobin: 9.5 g/dL — ABNORMAL LOW (ref 13.0–17.0)
O2 Saturation: 57 %
O2 Saturation: 62 %
O2 Saturation: 69 %
Potassium: 4 mmol/L (ref 3.5–5.1)
Potassium: 4.1 mmol/L (ref 3.5–5.1)
Potassium: 4.2 mmol/L (ref 3.5–5.1)
Sodium: 145 mmol/L (ref 135–145)
Sodium: 146 mmol/L — ABNORMAL HIGH (ref 135–145)
Sodium: 147 mmol/L — ABNORMAL HIGH (ref 135–145)
TCO2: 20 mmol/L — ABNORMAL LOW (ref 22–32)
TCO2: 21 mmol/L — ABNORMAL LOW (ref 22–32)
TCO2: 22 mmol/L (ref 22–32)
pCO2, Ven: 36.2 mmHg — ABNORMAL LOW (ref 44–60)
pCO2, Ven: 39 mmHg — ABNORMAL LOW (ref 44–60)
pCO2, Ven: 39.4 mmHg — ABNORMAL LOW (ref 44–60)
pH, Ven: 7.319 (ref 7.25–7.43)
pH, Ven: 7.321 (ref 7.25–7.43)
pH, Ven: 7.331 (ref 7.25–7.43)
pO2, Ven: 32 mmHg (ref 32–45)
pO2, Ven: 34 mmHg (ref 32–45)
pO2, Ven: 38 mmHg (ref 32–45)

## 2023-08-01 LAB — GLUCOSE, CAPILLARY
Glucose-Capillary: 121 mg/dL — ABNORMAL HIGH (ref 70–99)
Glucose-Capillary: 95 mg/dL (ref 70–99)

## 2023-08-01 SURGERY — RIGHT/LEFT HEART CATH AND CORONARY/GRAFT ANGIOGRAPHY
Anesthesia: LOCAL

## 2023-08-01 MED ORDER — SODIUM CHLORIDE 0.9% FLUSH: 3.0000 mL | INTRAVENOUS | Status: DC | PRN

## 2023-08-01 MED ORDER — MIDAZOLAM HCL 2 MG/2ML IJ SOLN
INTRAMUSCULAR | Status: AC
  Filled 2023-08-01: qty 2

## 2023-08-01 MED ORDER — HYDRALAZINE HCL 20 MG/ML IJ SOLN: 10.0000 mg | INTRAMUSCULAR | Status: DC | PRN

## 2023-08-01 MED ORDER — MIDAZOLAM HCL 2 MG/2ML IJ SOLN
INTRAMUSCULAR | Status: DC | PRN
  Administered 2023-08-01: 1 mg via INTRAVENOUS

## 2023-08-01 MED ORDER — LIDOCAINE HCL (PF) 1 % IJ SOLN
INTRAMUSCULAR | Status: DC | PRN
  Administered 2023-08-01: 5 mL

## 2023-08-01 MED ORDER — ACETAMINOPHEN 325 MG PO TABS: 650.0000 mg | ORAL_TABLET | ORAL | Status: DC | PRN

## 2023-08-01 MED ORDER — SODIUM CHLORIDE 0.9 % IV SOLN: 250.0000 mL | INTRAVENOUS | Status: DC | PRN

## 2023-08-01 MED ORDER — IOHEXOL 350 MG/ML SOLN
INTRAVENOUS | Status: DC | PRN
  Administered 2023-08-01: 40 mL

## 2023-08-01 MED ORDER — SODIUM CHLORIDE 0.9 % IV SOLN: INTRAVENOUS | Status: DC

## 2023-08-01 MED ORDER — HEPARIN (PORCINE) IN NACL 1000-0.9 UT/500ML-% IV SOLN
INTRAVENOUS | Status: DC | PRN
  Administered 2023-08-01: 1000 mL

## 2023-08-01 MED ORDER — ONDANSETRON HCL 4 MG/2ML IJ SOLN: 4.0000 mg | Freq: Four times a day (QID) | INTRAMUSCULAR | Status: DC | PRN

## 2023-08-01 MED ORDER — FENTANYL CITRATE (PF) 100 MCG/2ML IJ SOLN
INTRAMUSCULAR | Status: DC | PRN
  Administered 2023-08-01: 25 ug via INTRAVENOUS

## 2023-08-01 MED ORDER — FENTANYL CITRATE (PF) 100 MCG/2ML IJ SOLN
INTRAMUSCULAR | Status: AC
  Filled 2023-08-01: qty 2

## 2023-08-01 MED ORDER — LIDOCAINE HCL (PF) 1 % IJ SOLN
INTRAMUSCULAR | Status: AC
  Filled 2023-08-01: qty 30

## 2023-08-01 MED ORDER — LABETALOL HCL 5 MG/ML IV SOLN: 10.0000 mg | INTRAVENOUS | Status: DC | PRN

## 2023-08-01 MED ORDER — ASPIRIN 81 MG PO CHEW: 81.0000 mg | CHEWABLE_TABLET | ORAL | Status: DC

## 2023-08-01 MED ORDER — SODIUM CHLORIDE 0.9% FLUSH: 3.0000 mL | Freq: Two times a day (BID) | INTRAVENOUS | Status: DC

## 2023-08-01 MED ORDER — CLOPIDOGREL BISULFATE 75 MG PO TABS
75.0000 mg | ORAL_TABLET | ORAL | Status: AC
  Administered 2023-08-01: 75 mg via ORAL
  Filled 2023-08-01: qty 1

## 2023-08-01 SURGICAL SUPPLY — 15 items
CATH INFINITI 5 FR IM (CATHETERS) IMPLANT
CATH INFINITI 6F ANG MULTIPACK (CATHETERS) IMPLANT
CATH WEDGE PA 7FR 110 (CATHETERS) IMPLANT
CLOSURE PERCLOSE PROSTYLE (VASCULAR PRODUCTS) IMPLANT
ELECT DEFIB PAD ADLT CADENCE (PAD) IMPLANT
MAT PREVALON FULL STRYKER (MISCELLANEOUS) IMPLANT
PACK CARDIAC CATHETERIZATION (CUSTOM PROCEDURE TRAY) ×1 IMPLANT
SET ATX-X65L (MISCELLANEOUS) IMPLANT
SHEATH PINNACLE 6F 10CM (SHEATH) IMPLANT
SHEATH PINNACLE 7F 10CM (SHEATH) IMPLANT
SHEATH PROBE COVER 6X72 (BAG) IMPLANT
TUBING CIL FLEX 10 FLL-RA (TUBING) IMPLANT
WIRE EMERALD 3MM-J .035X150CM (WIRE) IMPLANT
WIRE EMERALD 3MM-J .035X260CM (WIRE) IMPLANT
WIRE MICRO SET SILHO 5FR 7 (SHEATH) IMPLANT

## 2023-08-01 NOTE — Interval H&P Note (Signed)
 History and Physical Interval Note:  08/01/2023 6:44 AM  Phillip [name]  has presented today for surgery, with the diagnosis of aortic stenosis.  The various methods of treatment have been discussed with the patient and family. After consideration of risks, benefits and other options for treatment, the patient has consented to  Procedure(s): RIGHT/LEFT HEART CATH AND CORONARY/GRAFT ANGIOGRAPHY (N/A) as a surgical intervention.  The patient's history has been reviewed, patient examined, no change in status, stable for surgery.  I have reviewed the patient's chart and labs.  Questions were answered to the patient's satisfaction.     Orbie Pyo

## 2023-08-01 NOTE — Progress Notes (Signed)
 Up and walked and tolerated well; right groin stable, no bleeding or hematoma

## 2023-08-10 ENCOUNTER — Other Ambulatory Visit: Payer: Self-pay | Admitting: Cardiology

## 2023-08-10 DIAGNOSIS — I2510 Atherosclerotic heart disease of native coronary artery without angina pectoris: Secondary | ICD-10-CM

## 2023-08-11 ENCOUNTER — Other Ambulatory Visit: Payer: Self-pay

## 2023-08-11 ENCOUNTER — Encounter (HOSPITAL_COMMUNITY): Payer: Self-pay

## 2023-08-11 ENCOUNTER — Inpatient Hospital Stay (HOSPITAL_COMMUNITY)
Admission: EM | Admit: 2023-08-11 | Discharge: 2023-08-25 | DRG: 264 | Disposition: A | Discharge status: Home / Self Care | Attending: Internal Medicine | Admitting: Internal Medicine

## 2023-08-11 ENCOUNTER — Emergency Department (HOSPITAL_COMMUNITY)

## 2023-08-11 DIAGNOSIS — D631 Anemia in chronic kidney disease: Secondary | ICD-10-CM | POA: Diagnosis present

## 2023-08-11 DIAGNOSIS — D696 Thrombocytopenia, unspecified: Secondary | ICD-10-CM | POA: Diagnosis present

## 2023-08-11 DIAGNOSIS — Z7902 Long term (current) use of antithrombotics/antiplatelets: Secondary | ICD-10-CM

## 2023-08-11 DIAGNOSIS — I132 Hypertensive heart and chronic kidney disease with heart failure and with stage 5 chronic kidney disease, or end stage renal disease: Principal | ICD-10-CM | POA: Diagnosis present

## 2023-08-11 DIAGNOSIS — Z515 Encounter for palliative care: Secondary | ICD-10-CM

## 2023-08-11 DIAGNOSIS — I509 Heart failure, unspecified: Principal | ICD-10-CM

## 2023-08-11 DIAGNOSIS — E1151 Type 2 diabetes mellitus with diabetic peripheral angiopathy without gangrene: Secondary | ICD-10-CM | POA: Diagnosis present

## 2023-08-11 DIAGNOSIS — I444 Left anterior fascicular block: Secondary | ICD-10-CM | POA: Diagnosis present

## 2023-08-11 DIAGNOSIS — E440 Moderate protein-calorie malnutrition: Secondary | ICD-10-CM

## 2023-08-11 DIAGNOSIS — I445 Left posterior fascicular block: Secondary | ICD-10-CM | POA: Diagnosis present

## 2023-08-11 DIAGNOSIS — Z87891 Personal history of nicotine dependence: Secondary | ICD-10-CM

## 2023-08-11 DIAGNOSIS — N186 End stage renal disease: Secondary | ICD-10-CM | POA: Diagnosis present

## 2023-08-11 DIAGNOSIS — E8889 Other specified metabolic disorders: Secondary | ICD-10-CM | POA: Diagnosis present

## 2023-08-11 DIAGNOSIS — I350 Nonrheumatic aortic (valve) stenosis: Secondary | ICD-10-CM

## 2023-08-11 DIAGNOSIS — I080 Rheumatic disorders of both mitral and aortic valves: Secondary | ICD-10-CM | POA: Diagnosis present

## 2023-08-11 DIAGNOSIS — E039 Hypothyroidism, unspecified: Secondary | ICD-10-CM | POA: Diagnosis present

## 2023-08-11 DIAGNOSIS — N179 Acute kidney failure, unspecified: Secondary | ICD-10-CM | POA: Diagnosis not present

## 2023-08-11 DIAGNOSIS — E1122 Type 2 diabetes mellitus with diabetic chronic kidney disease: Secondary | ICD-10-CM | POA: Diagnosis present

## 2023-08-11 DIAGNOSIS — I5020 Unspecified systolic (congestive) heart failure: Secondary | ICD-10-CM | POA: Diagnosis present

## 2023-08-11 DIAGNOSIS — Z79899 Other long term (current) drug therapy: Secondary | ICD-10-CM

## 2023-08-11 DIAGNOSIS — N189 Chronic kidney disease, unspecified: Secondary | ICD-10-CM | POA: Diagnosis present

## 2023-08-11 DIAGNOSIS — I10 Essential (primary) hypertension: Secondary | ICD-10-CM | POA: Diagnosis present

## 2023-08-11 DIAGNOSIS — E785 Hyperlipidemia, unspecified: Secondary | ICD-10-CM | POA: Diagnosis present

## 2023-08-11 DIAGNOSIS — Z89422 Acquired absence of other left toe(s): Secondary | ICD-10-CM

## 2023-08-11 DIAGNOSIS — E1142 Type 2 diabetes mellitus with diabetic polyneuropathy: Secondary | ICD-10-CM | POA: Diagnosis present

## 2023-08-11 DIAGNOSIS — Z83438 Family history of other disorder of lipoprotein metabolism and other lipidemia: Secondary | ICD-10-CM

## 2023-08-11 DIAGNOSIS — Z794 Long term (current) use of insulin: Secondary | ICD-10-CM

## 2023-08-11 DIAGNOSIS — Z833 Family history of diabetes mellitus: Secondary | ICD-10-CM

## 2023-08-11 DIAGNOSIS — G4733 Obstructive sleep apnea (adult) (pediatric): Secondary | ICD-10-CM | POA: Diagnosis present

## 2023-08-11 DIAGNOSIS — K219 Gastro-esophageal reflux disease without esophagitis: Secondary | ICD-10-CM | POA: Diagnosis present

## 2023-08-11 DIAGNOSIS — I252 Old myocardial infarction: Secondary | ICD-10-CM

## 2023-08-11 DIAGNOSIS — Z8249 Family history of ischemic heart disease and other diseases of the circulatory system: Secondary | ICD-10-CM

## 2023-08-11 DIAGNOSIS — D61818 Other pancytopenia: Secondary | ICD-10-CM | POA: Diagnosis present

## 2023-08-11 DIAGNOSIS — I272 Pulmonary hypertension, unspecified: Secondary | ICD-10-CM | POA: Diagnosis present

## 2023-08-11 DIAGNOSIS — Z951 Presence of aortocoronary bypass graft: Secondary | ICD-10-CM

## 2023-08-11 DIAGNOSIS — E1169 Type 2 diabetes mellitus with other specified complication: Secondary | ICD-10-CM | POA: Diagnosis present

## 2023-08-11 DIAGNOSIS — D72819 Decreased white blood cell count, unspecified: Secondary | ICD-10-CM | POA: Diagnosis present

## 2023-08-11 DIAGNOSIS — N400 Enlarged prostate without lower urinary tract symptoms: Secondary | ICD-10-CM | POA: Diagnosis present

## 2023-08-11 DIAGNOSIS — Z7982 Long term (current) use of aspirin: Secondary | ICD-10-CM

## 2023-08-11 DIAGNOSIS — Z6827 Body mass index (BMI) 27.0-27.9, adult: Secondary | ICD-10-CM

## 2023-08-11 DIAGNOSIS — I5023 Acute on chronic systolic (congestive) heart failure: Secondary | ICD-10-CM | POA: Diagnosis present

## 2023-08-11 DIAGNOSIS — I2510 Atherosclerotic heart disease of native coronary artery without angina pectoris: Secondary | ICD-10-CM | POA: Diagnosis present

## 2023-08-11 DIAGNOSIS — Z79890 Hormone replacement therapy (postmenopausal): Secondary | ICD-10-CM

## 2023-08-11 DIAGNOSIS — Z1152 Encounter for screening for COVID-19: Secondary | ICD-10-CM

## 2023-08-11 DIAGNOSIS — Z9862 Peripheral vascular angioplasty status: Secondary | ICD-10-CM

## 2023-08-11 DIAGNOSIS — Z992 Dependence on renal dialysis: Secondary | ICD-10-CM

## 2023-08-11 DIAGNOSIS — N184 Chronic kidney disease, stage 4 (severe): Secondary | ICD-10-CM

## 2023-08-11 LAB — BASIC METABOLIC PANEL
Anion gap: 12 (ref 5–15)
BUN: 128 mg/dL — ABNORMAL HIGH (ref 8–23)
CO2: 21 mmol/L — ABNORMAL LOW (ref 22–32)
Calcium: 7.7 mg/dL — ABNORMAL LOW (ref 8.9–10.3)
Chloride: 108 mmol/L (ref 98–111)
Creatinine, Ser: 6.17 mg/dL — ABNORMAL HIGH (ref 0.61–1.24)
GFR, Estimated: 9 mL/min — ABNORMAL LOW (ref >60)
Glucose, Bld: 112 mg/dL — ABNORMAL HIGH (ref 70–99)
Potassium: 5 mmol/L (ref 3.5–5.1)
Sodium: 141 mmol/L (ref 135–145)

## 2023-08-11 LAB — CBC WITH DIFFERENTIAL/PLATELET
Abs Immature Granulocytes: 0.03 10*3/uL (ref 0.00–0.07)
Basophils Absolute: 0 10*3/uL (ref 0.0–0.1)
Basophils Relative: 1 %
Eosinophils Absolute: 0.1 10*3/uL (ref 0.0–0.5)
Eosinophils Relative: 2 %
HCT: 30.9 % — ABNORMAL LOW (ref 39.0–52.0)
Hemoglobin: 9.8 g/dL — ABNORMAL LOW (ref 13.0–17.0)
Immature Granulocytes: 1 %
Lymphocytes Relative: 10 %
Lymphs Abs: 0.6 10*3/uL — ABNORMAL LOW (ref 0.7–4.0)
MCH: 29.9 pg (ref 26.0–34.0)
MCHC: 31.7 g/dL (ref 30.0–36.0)
MCV: 94.2 fL (ref 80.0–100.0)
Monocytes Absolute: 0.5 10*3/uL (ref 0.1–1.0)
Monocytes Relative: 9 %
Neutro Abs: 4.4 10*3/uL (ref 1.7–7.7)
Neutrophils Relative %: 77 %
Platelets: 115 10*3/uL — ABNORMAL LOW (ref 150–400)
RBC: 3.28 MIL/uL — ABNORMAL LOW (ref 4.22–5.81)
RDW: 14.9 % (ref 11.5–15.5)
WBC: 5.7 10*3/uL (ref 4.0–10.5)
nRBC: 0 % (ref 0.0–0.2)

## 2023-08-11 LAB — BRAIN NATRIURETIC PEPTIDE: B Natriuretic Peptide: 865 pg/mL — ABNORMAL HIGH (ref 0.0–100.0)

## 2023-08-11 NOTE — ED Triage Notes (Signed)
 Patient reports leg swelling and shob for a few weeks, takes lasix but reports it isn't helping. Weeping edema bilaterally in legs, able to ambulate but reports dyspnea. A&Ox4,

## 2023-08-11 NOTE — ED Provider Triage Note (Signed)
 Emergency Medicine Provider Triage Evaluation Note  Phillip [name] , a 68 y.o. male  was evaluated in triage.  Pt complains of bilateral lower extremity edema and weeping, history of CHF, progressively worsening shortness of breath of past several days.  Patient taking his normal Lasix dose without relief.  Review of Systems  Positive: Shortness of breath and lower extremity swelling Negative: Fever  Physical Exam  BP 138/76 (BP Location: Left Arm)   Pulse 67   Temp 97.9 F (36.6 C)   Resp 16   SpO2 95%  Gen:   Awake, no distress   Resp:  Normal effort  MSK:   Moves extremities without difficulty  Other:    Medical Decision Making  Medically screening exam initiated at 6:33 PM.  Appropriate orders placed.  Phillip [name] was informed that the remainder of the evaluation will be completed by another provider, this initial triage assessment does not replace that evaluation, and the importance of remaining in the ED until their evaluation is complete.     Arthor Captain, PA-C 08/11/23 [phone]

## 2023-08-12 ENCOUNTER — Other Ambulatory Visit (HOSPITAL_COMMUNITY)

## 2023-08-12 DIAGNOSIS — Z794 Long term (current) use of insulin: Secondary | ICD-10-CM

## 2023-08-12 DIAGNOSIS — E039 Hypothyroidism, unspecified: Secondary | ICD-10-CM | POA: Diagnosis present

## 2023-08-12 DIAGNOSIS — E118 Type 2 diabetes mellitus with unspecified complications: Secondary | ICD-10-CM

## 2023-08-12 DIAGNOSIS — I2510 Atherosclerotic heart disease of native coronary artery without angina pectoris: Secondary | ICD-10-CM

## 2023-08-12 DIAGNOSIS — E785 Hyperlipidemia, unspecified: Secondary | ICD-10-CM | POA: Diagnosis present

## 2023-08-12 DIAGNOSIS — N179 Acute kidney failure, unspecified: Secondary | ICD-10-CM | POA: Diagnosis present

## 2023-08-12 DIAGNOSIS — I5020 Unspecified systolic (congestive) heart failure: Secondary | ICD-10-CM | POA: Diagnosis not present

## 2023-08-12 DIAGNOSIS — I10 Essential (primary) hypertension: Secondary | ICD-10-CM

## 2023-08-12 DIAGNOSIS — D61818 Other pancytopenia: Secondary | ICD-10-CM | POA: Insufficient documentation

## 2023-08-12 DIAGNOSIS — Z7189 Other specified counseling: Secondary | ICD-10-CM | POA: Diagnosis not present

## 2023-08-12 DIAGNOSIS — I350 Nonrheumatic aortic (valve) stenosis: Secondary | ICD-10-CM | POA: Diagnosis not present

## 2023-08-12 DIAGNOSIS — E1122 Type 2 diabetes mellitus with diabetic chronic kidney disease: Secondary | ICD-10-CM

## 2023-08-12 DIAGNOSIS — Z992 Dependence on renal dialysis: Secondary | ICD-10-CM | POA: Diagnosis not present

## 2023-08-12 DIAGNOSIS — D631 Anemia in chronic kidney disease: Secondary | ICD-10-CM | POA: Diagnosis present

## 2023-08-12 DIAGNOSIS — I132 Hypertensive heart and chronic kidney disease with heart failure and with stage 5 chronic kidney disease, or end stage renal disease: Secondary | ICD-10-CM | POA: Diagnosis present

## 2023-08-12 DIAGNOSIS — I080 Rheumatic disorders of both mitral and aortic valves: Secondary | ICD-10-CM | POA: Diagnosis present

## 2023-08-12 DIAGNOSIS — Z1152 Encounter for screening for COVID-19: Secondary | ICD-10-CM | POA: Diagnosis not present

## 2023-08-12 DIAGNOSIS — N189 Chronic kidney disease, unspecified: Secondary | ICD-10-CM

## 2023-08-12 DIAGNOSIS — N185 Chronic kidney disease, stage 5: Secondary | ICD-10-CM | POA: Diagnosis not present

## 2023-08-12 DIAGNOSIS — I5042 Chronic combined systolic (congestive) and diastolic (congestive) heart failure: Secondary | ICD-10-CM | POA: Diagnosis not present

## 2023-08-12 DIAGNOSIS — E440 Moderate protein-calorie malnutrition: Secondary | ICD-10-CM | POA: Diagnosis present

## 2023-08-12 DIAGNOSIS — I272 Pulmonary hypertension, unspecified: Secondary | ICD-10-CM | POA: Diagnosis present

## 2023-08-12 DIAGNOSIS — Z515 Encounter for palliative care: Secondary | ICD-10-CM | POA: Diagnosis not present

## 2023-08-12 DIAGNOSIS — E1169 Type 2 diabetes mellitus with other specified complication: Secondary | ICD-10-CM | POA: Diagnosis present

## 2023-08-12 DIAGNOSIS — D72819 Decreased white blood cell count, unspecified: Secondary | ICD-10-CM | POA: Diagnosis present

## 2023-08-12 DIAGNOSIS — E782 Mixed hyperlipidemia: Secondary | ICD-10-CM

## 2023-08-12 DIAGNOSIS — D696 Thrombocytopenia, unspecified: Secondary | ICD-10-CM | POA: Diagnosis present

## 2023-08-12 DIAGNOSIS — I5023 Acute on chronic systolic (congestive) heart failure: Secondary | ICD-10-CM

## 2023-08-12 DIAGNOSIS — N186 End stage renal disease: Secondary | ICD-10-CM | POA: Diagnosis present

## 2023-08-12 DIAGNOSIS — E1142 Type 2 diabetes mellitus with diabetic polyneuropathy: Secondary | ICD-10-CM | POA: Diagnosis present

## 2023-08-12 DIAGNOSIS — E1151 Type 2 diabetes mellitus with diabetic peripheral angiopathy without gangrene: Secondary | ICD-10-CM | POA: Diagnosis present

## 2023-08-12 DIAGNOSIS — E8889 Other specified metabolic disorders: Secondary | ICD-10-CM | POA: Diagnosis present

## 2023-08-12 DIAGNOSIS — N184 Chronic kidney disease, stage 4 (severe): Secondary | ICD-10-CM

## 2023-08-12 HISTORY — DX: Acute on chronic systolic (congestive) heart failure: I50.23

## 2023-08-12 HISTORY — DX: Other pancytopenia: D61.818

## 2023-08-12 LAB — CBG MONITORING, ED
Glucose-Capillary: 192 mg/dL — ABNORMAL HIGH (ref 70–99)
Glucose-Capillary: 162 mg/dL — ABNORMAL HIGH (ref 70–99)

## 2023-08-12 LAB — TSH: TSH: 7.948 u[IU]/mL — ABNORMAL HIGH (ref 0.350–4.500)

## 2023-08-12 MED ORDER — CYCLOBENZAPRINE HCL 10 MG PO TABS: 10.0000 mg | ORAL_TABLET | Freq: Three times a day (TID) | ORAL | Status: DC | PRN

## 2023-08-12 MED ORDER — CARVEDILOL 3.125 MG PO TABS
3.1250 mg | ORAL_TABLET | Freq: Two times a day (BID) | ORAL | Status: DC
  Administered 2023-08-12 – 2023-08-25 (×27): 3.125 mg via ORAL
  Filled 2023-08-12 (×27): qty 1

## 2023-08-12 MED ORDER — TAMSULOSIN HCL 0.4 MG PO CAPS
0.4000 mg | ORAL_CAPSULE | Freq: Every day | ORAL | Status: DC
  Administered 2023-08-12 – 2023-08-25 (×12): 0.4 mg via ORAL
  Filled 2023-08-12 (×12): qty 1

## 2023-08-12 MED ORDER — VITAMIN B-12 1000 MCG PO TABS
500.0000 ug | ORAL_TABLET | Freq: Every day | ORAL | Status: DC
  Administered 2023-08-12 – 2023-08-25 (×13): 500 ug via ORAL
  Filled 2023-08-12 (×13): qty 1

## 2023-08-12 MED ORDER — ACETAMINOPHEN 650 MG RE SUPP: 650.0000 mg | Freq: Four times a day (QID) | RECTAL | Status: DC | PRN

## 2023-08-12 MED ORDER — BUMETANIDE 0.25 MG/ML IJ SOLN
2.0000 mg | Freq: Once | INTRAMUSCULAR | Status: AC
Start: 2023-08-12 — End: 2023-08-12
  Administered 2023-08-12: 2 mg via INTRAVENOUS
  Filled 2023-08-12: qty 8

## 2023-08-12 MED ORDER — ALBUTEROL SULFATE (2.5 MG/3ML) 0.083% IN NEBU: 2.5000 mg | INHALATION_SOLUTION | Freq: Four times a day (QID) | RESPIRATORY_TRACT | Status: DC | PRN

## 2023-08-12 MED ORDER — AMLODIPINE BESYLATE 5 MG PO TABS
5.0000 mg | ORAL_TABLET | Freq: Every day | ORAL | Status: DC
  Administered 2023-08-12 – 2023-08-16 (×5): 5 mg via ORAL
  Filled 2023-08-12 (×5): qty 1

## 2023-08-12 MED ORDER — BUMETANIDE 2 MG PO TABS: 2.0000 mg | ORAL_TABLET | Freq: Two times a day (BID) | ORAL | Status: DC

## 2023-08-12 MED ORDER — HEPARIN SODIUM (PORCINE) 5000 UNIT/ML IJ SOLN
5000.0000 [IU] | Freq: Three times a day (TID) | INTRAMUSCULAR | Status: DC
  Administered 2023-08-12 – 2023-08-25 (×37): 5000 [IU] via SUBCUTANEOUS
  Filled 2023-08-12 (×36): qty 1

## 2023-08-12 MED ORDER — ENOXAPARIN SODIUM 40 MG/0.4ML IJ SOSY
40.0000 mg | PREFILLED_SYRINGE | INTRAMUSCULAR | Status: DC
Start: 2023-08-12 — End: 2023-08-12

## 2023-08-12 MED ORDER — ISOSORBIDE MONONITRATE ER 60 MG PO TB24
120.0000 mg | ORAL_TABLET | Freq: Every day | ORAL | Status: DC
  Administered 2023-08-12 – 2023-08-13 (×2): 120 mg via ORAL
  Filled 2023-08-12: qty 2
  Filled 2023-08-12: qty 4

## 2023-08-12 MED ORDER — NITROGLYCERIN 0.4 MG SL SUBL: 0.4000 mg | SUBLINGUAL_TABLET | SUBLINGUAL | Status: DC | PRN

## 2023-08-12 MED ORDER — ONDANSETRON HCL 4 MG/2ML IJ SOLN: 4.0000 mg | Freq: Four times a day (QID) | INTRAMUSCULAR | Status: DC | PRN

## 2023-08-12 MED ORDER — ASPIRIN 81 MG PO TBEC
81.0000 mg | DELAYED_RELEASE_TABLET | Freq: Every day | ORAL | Status: DC
  Administered 2023-08-12 – 2023-08-25 (×13): 81 mg via ORAL
  Filled 2023-08-12 (×13): qty 1

## 2023-08-12 MED ORDER — LEVOTHYROXINE SODIUM 50 MCG PO TABS
50.0000 ug | ORAL_TABLET | Freq: Every day | ORAL | Status: DC
  Administered 2023-08-12 – 2023-08-25 (×13): 50 ug via ORAL
  Filled 2023-08-12 (×13): qty 1

## 2023-08-12 MED ORDER — CLOPIDOGREL BISULFATE 75 MG PO TABS
75.0000 mg | ORAL_TABLET | Freq: Every day | ORAL | Status: DC
  Administered 2023-08-12 – 2023-08-25 (×13): 75 mg via ORAL
  Filled 2023-08-12 (×13): qty 1

## 2023-08-12 MED ORDER — ATORVASTATIN CALCIUM 10 MG PO TABS
20.0000 mg | ORAL_TABLET | Freq: Every day | ORAL | Status: DC
  Administered 2023-08-12 – 2023-08-25 (×13): 20 mg via ORAL
  Filled 2023-08-12 (×13): qty 2

## 2023-08-12 MED ORDER — ACETAMINOPHEN 325 MG PO TABS
650.0000 mg | ORAL_TABLET | Freq: Four times a day (QID) | ORAL | Status: DC | PRN
  Administered 2023-08-19 – 2023-08-22 (×2): 650 mg via ORAL
  Filled 2023-08-12 (×2): qty 2

## 2023-08-12 MED ORDER — INSULIN ASPART 100 UNIT/ML IJ SOLN
0.0000 [IU] | Freq: Three times a day (TID) | INTRAMUSCULAR | Status: DC
  Administered 2023-08-12 – 2023-08-17 (×6): 1 [IU] via SUBCUTANEOUS
  Administered 2023-08-18: 2 [IU] via SUBCUTANEOUS
  Administered 2023-08-18: 1 [IU] via SUBCUTANEOUS
  Administered 2023-08-20: 5 [IU] via SUBCUTANEOUS
  Administered 2023-08-21: 1 [IU] via SUBCUTANEOUS
  Administered 2023-08-21 – 2023-08-22 (×2): 3 [IU] via SUBCUTANEOUS
  Administered 2023-08-22 – 2023-08-23 (×2): 2 [IU] via SUBCUTANEOUS
  Administered 2023-08-24 (×2): 1 [IU] via SUBCUTANEOUS

## 2023-08-12 MED ORDER — INSULIN GLARGINE 100 UNIT/ML ~~LOC~~ SOLN
7.0000 [IU] | Freq: Every day | SUBCUTANEOUS | Status: DC
  Administered 2023-08-13 – 2023-08-24 (×13): 7 [IU] via SUBCUTANEOUS
  Filled 2023-08-12 (×14): qty 0.07

## 2023-08-12 MED ORDER — CLONIDINE HCL 0.2 MG PO TABS
0.3000 mg | ORAL_TABLET | Freq: Every day | ORAL | Status: DC
  Administered 2023-08-12: 0.3 mg via ORAL
  Filled 2023-08-12: qty 1

## 2023-08-12 MED ORDER — FUROSEMIDE 10 MG/ML IJ SOLN
120.0000 mg | Freq: Two times a day (BID) | INTRAVENOUS | Status: DC
  Administered 2023-08-12 – 2023-08-13 (×3): 120 mg via INTRAVENOUS
  Filled 2023-08-12: qty 12
  Filled 2023-08-12 (×2): qty 10
  Filled 2023-08-12: qty 12
  Filled 2023-08-12: qty 10

## 2023-08-12 MED ORDER — LATANOPROST 0.005 % OP SOLN
1.0000 [drp] | Freq: Every day | OPHTHALMIC | Status: DC
  Administered 2023-08-13 – 2023-08-24 (×13): 1 [drp] via OPHTHALMIC
  Filled 2023-08-12: qty 2.5

## 2023-08-12 MED ORDER — GABAPENTIN 300 MG PO CAPS
600.0000 mg | ORAL_CAPSULE | Freq: Two times a day (BID) | ORAL | Status: DC
  Administered 2023-08-12 – 2023-08-13 (×3): 600 mg via ORAL
  Filled 2023-08-12 (×3): qty 2

## 2023-08-12 MED ORDER — ONDANSETRON HCL 4 MG PO TABS: 4.0000 mg | ORAL_TABLET | Freq: Four times a day (QID) | ORAL | Status: DC | PRN

## 2023-08-12 NOTE — ED Provider Notes (Signed)
 [place] EMERGENCY DEPARTMENT AT [facility] Provider Note   CSN: [id number] Arrival date & time: 08/11/23  1734     History  Chief Complaint  Patient presents with   Shortness of Breath   Leg Swelling    Phillip [name] is a 68 y.o. male.  67 past medical history significant for CHF, CKD, as well as history of severe aortic stenosis presents today for complaint of progressively worsening volume overload symptoms over the past 2 to 3 weeks.  He has been seen by his nephrologist recently and had his Bumex increased to 2 times a day 3 mg dose.  He denies chest pain.  He is sleeping upright for the past week and 1/2-2 weeks, has worsening peripheral edema, and has dyspnea with exertion.  The history is provided by the patient. No language interpreter was used.       Home Medications Prior to Admission medications   Medication Sig Start Date End Date Taking? Authorizing Provider  acetaminophen (TYLENOL) 500 MG tablet Take 1,000 mg by mouth every 6 (six) hours as needed for moderate pain or headache.     [provider]  amLODipine (NORVASC) 5 MG tablet Take 1 tablet (5 mg total) by mouth daily. 05/16/23 08/14/23  Flossie Dibble, NP  aspirin EC 81 MG tablet Take 1 tablet (81 mg total) by mouth daily. Swallow whole. 07/25/23   Orbie Pyo, MD  atorvastatin (LIPITOR) 20 MG tablet Take 1 tablet (20 mg total) by mouth daily. 07/25/23   Orbie Pyo, MD  Blood Glucose Monitoring Suppl (TGT BLOOD GLUCOSE MONITORING) w/Device KIT 3 (three) times daily. 09/20/21   [provider]  bumetanide (BUMEX) 2 MG tablet Take 1 tablet (2 mg total) by mouth 2 (two) times daily. Take an extra tablet for weight gain of 3 lbs or greater per day. 07/15/23   Flossie Dibble, NP  carvedilol (COREG) 3.125 MG tablet Take 1 tablet (3.125 mg total) by mouth 2 (two) times daily. 05/20/23 08/18/23  Flossie Dibble, NP  cloNIDine (CATAPRES) 0.3 MG tablet Take 1 tablet (0.3 mg  total) by mouth at bedtime. 07/14/23   Flossie Dibble, NP  clopidogrel (PLAVIX) 75 MG tablet Take 1 tablet (75 mg total) by mouth daily. 11/20/20   Revankar, Aundra Dubin, MD  cyclobenzaprine (FLEXERIL) 10 MG tablet Take 10 mg by mouth 3 (three) times daily as needed for muscle spasms.    [provider]  gabapentin (NEURONTIN) 600 MG tablet Take 600 mg by mouth 2 (two) times daily.    [provider]  Insulin Glargine (BASAGLAR KWIKPEN) 100 UNIT/ML Inject 7 Units into the skin at bedtime. 07/18/23 07/17/24  [provider]  isosorbide mononitrate (IMDUR) 120 MG 24 hr tablet Take 1 tablet by mouth once daily 08/11/23   Revankar, Aundra Dubin, MD  Lancets MISC 1 each by miscellaneous route 2 (two) times a day. 09/20/21   [provider]  latanoprost (XALATAN) 0.005 % ophthalmic solution Place 1 drop into both eyes at bedtime. 04/15/23   [provider]  levothyroxine (SYNTHROID) 50 MCG tablet Take 50 mcg by mouth daily. 04/05/23   [provider]  nitroGLYCERIN (NITROSTAT) 0.4 MG SL tablet Place 1 tablet (0.4 mg total) under the tongue every 5 (five) minutes as needed for chest pain. 11/20/20   Revankar, Aundra Dubin, MD  tamsulosin (FLOMAX) 0.4 MG CAPS capsule Take 0.4 mg by mouth daily after breakfast. 04/19/20   [provider]  vitamin B-12 (CYANOCOBALAMIN) 500 MCG tablet Take 500 mcg by mouth daily.     [provider]  Vitamin D, Ergocalciferol, (DRISDOL) 1.25 MG (50000 UNIT) CAPS capsule Take 50,000 Units by mouth once a week. Mondays 07/13/21   [provider]      Allergies    Patient has no known allergies.    Review of Systems   Review of Systems  Constitutional:  Negative for chills and fever.  Respiratory:  Positive for shortness of breath. Negative for cough.   Cardiovascular:  Positive for chest pain and leg swelling.  Gastrointestinal:  Negative for abdominal pain.  Neurological:  Negative for light-headedness.  All  other systems reviewed and are negative.   Physical Exam Updated Vital Signs BP (!) 140/86 (BP Location: Left Arm)   Pulse 67   Temp 97.7 F (36.5 C)   Resp 12   Ht 6\' 3"  (1.905 m)   Wt 101.2 kg   SpO2 98%   BMI 27.89 kg/m  Physical Exam Vitals and nursing note reviewed.  Constitutional:      General: He is not in acute distress.    Appearance: Normal appearance. He is not ill-appearing.  HENT:     Head: Normocephalic and atraumatic.     Nose: Nose normal.  Eyes:     Conjunctiva/sclera: Conjunctivae normal.  Cardiovascular:     Rate and Rhythm: Normal rate.  Pulmonary:     Effort: Pulmonary effort is normal. No respiratory distress.     Breath sounds: Normal breath sounds. No wheezing.  Abdominal:     General: There is no distension.     Palpations: Abdomen is soft.     Tenderness: There is no abdominal tenderness.  Musculoskeletal:        General: No deformity. Normal range of motion.     Cervical back: Normal range of motion.     Right lower leg: Edema present.     Left lower leg: Edema present.  Skin:    Findings: No rash.  Neurological:     Mental Status: He is alert.     ED Results / Procedures / Treatments   Labs (all labs ordered are listed, but only abnormal results are displayed) Labs Reviewed  BASIC METABOLIC PANEL - Abnormal; Notable for the following components:      Result Value   CO2 21 (*)    Glucose, Bld 112 (*)    BUN 128 (*)    Creatinine, Ser 6.17 (*)    Calcium 7.7 (*)    GFR, Estimated 9 (*)    All other components within normal limits  CBC WITH DIFFERENTIAL/PLATELET - Abnormal; Notable for the following components:   RBC 3.28 (*)    Hemoglobin 9.8 (*)    HCT 30.9 (*)    Platelets 115 (*)    Lymphs Abs 0.6 (*)    All other components within normal limits  BRAIN NATRIURETIC PEPTIDE - Abnormal; Notable for the following components:   B Natriuretic Peptide 865.0 (*)    All other components within normal limits     EKG None  Radiology DG Chest 2 View Result Date: 08/11/2023 CLINICAL DATA:  Dyspnea EXAM: CHEST - 2 VIEW COMPARISON:  11/06/2020 FINDINGS: Lung volumes are small, but are symmetric and are clear. No pneumothorax or pleural effusion. Coronary artery bypass grafting has been performed. Cardiac size is at the upper limits of normal. Rim vascularity is normal. No acute bone abnormality. IMPRESSION: 1. Pulmonary hypoinflation.  Borderline cardiomegaly. Electronically Signed   By: Helyn Numbers M.D.   On: 08/11/2023 21:44    Procedures .Critical Care  Performed by: Marita Kansas, PA-C Authorized by: Marita Kansas, PA-C   Critical care provider statement:    Critical care time (minutes):  30   Critical care was necessary to treat or prevent imminent or life-threatening deterioration of the following conditions: CHF exacerbation with renal congestion requiring admission and IVP diuretic.   Critical care was time spent personally by me on the following activities:  Development of treatment plan with patient or surrogate, discussions with consultants, evaluation of patient's response to treatment, examination of patient, ordering and review of laboratory studies, ordering and review of radiographic studies, ordering and performing treatments and interventions, pulse oximetry, re-evaluation of patient's condition and review of old charts   Care discussed with: admitting provider       Medications Ordered in ED Medications  bumetanide (BUMEX) injection 2 mg (has no administration in time range)    ED Course/ Medical Decision Making/ A&P                                 Medical Decision Making Risk Prescription drug management. Decision regarding hospitalization.   Medical Decision Making / ED Course   This patient presents to the ED for concern of shortness of breath, peripheral edema, orthopnea, this involves an extensive number of treatment options, and is a complaint that carries with  it a high risk of complications and morbidity.  The differential diagnosis includes CHF exacerbation, kidney failure, viral URI, ACS  MDM: 68 year old male with past medical history significant for aortic stenosis, CHF, CKD presents today for concern of worsening volume status.  CBC without leukocytosis.  Mild anemia.  BMP with creatinine of 6.17 which is uptrending from recent.  Still making urine.  BNP of 865 which is elevated from his baseline.  Hemodynamically stable.  Chest x-ray without acute concern.  Some vascular congestion noted.  EKG without acute ischemic change.  Significant peripheral edema.  History consistent with volume overload that is progressively worsening.  Kidney function is getting worse.  Notified nephrology.  They will be following.  Bumex 2 mg IVP ordered.  Discussed with hospitalist.  They will evaluate patient for admission.  Notify cardiology at their request.  Cardiology will consult.  Lab Tests: -I ordered, reviewed, and interpreted labs.   The pertinent results include:   Labs Reviewed  BASIC METABOLIC PANEL - Abnormal; Notable for the following components:      Result Value   CO2 21 (*)    Glucose, Bld 112 (*)    BUN 128 (*)    Creatinine, Ser 6.17 (*)    Calcium 7.7 (*)    GFR, Estimated 9 (*)    All other components within normal limits  CBC WITH DIFFERENTIAL/PLATELET - Abnormal; Notable for the following components:   RBC 3.28 (*)    Hemoglobin 9.8 (*)    HCT 30.9 (*)    Platelets 115 (*)    Lymphs Abs 0.6 (*)    All other components within normal limits  BRAIN NATRIURETIC PEPTIDE - Abnormal; Notable for the following components:   B Natriuretic Peptide 865.0 (*)    All other components within normal limits      EKG  EKG Interpretation Date/Time:    Ventricular Rate:    PR Interval:    QRS Duration:  QT Interval:    QTC Calculation:   R Axis:      Text Interpretation:           Imaging Studies ordered: I ordered imaging  studies including chest x-ray I independently visualized and interpreted imaging. I agree with the radiologist interpretation   Medicines ordered and prescription drug management: Meds ordered this encounter  Medications   bumetanide (BUMEX) injection 2 mg    -I have reviewed the patients home medicines and have made adjustments as needed  Consultations Obtained: I requested consultation with the cardiology and nephrology,  and discussed lab and imaging findings as well as pertinent plan - they recommend: As above   Reevaluation: After the interventions noted above, I reevaluated the patient and found that they have :stayed the same  Co morbidities that complicate the patient evaluation  Past Medical History:  Diagnosis Date   Abdominopelvic abscess (HCC) 09/29/2018   Abscess of appendix 09/22/2018   Acquired spondylolisthesis of lumbosacral region 03/20/2020   Anemia of chronic disease 08/18/2019   Aortic atherosclerosis (HCC)    Aortic stenosis    a.) TTE 09/15/2018: mild-mod (MPG 19); b.) TTE 03/21/2021: mild-mod (MPG 14.3); c.) TTE 04/24/2022: mild- mod (MPG 15)   Appendicitis 09/08/2018   Asthma    Benign hypertension with CKD (chronic kidney disease) stage IV (HCC) 02/24/2018   Benign prostatic hyperplasia without lower urinary tract symptoms 01/14/2018   Bilateral lower extremity edema 11/04/2018   Bradycardia 03/25/2018   Bruit of right carotid artery 07/26/2015   CAD (coronary artery disease) 2003   a.) s/p 4v CABG 2003   Cardiac murmur 07/26/2015   Chronic combined systolic and diastolic CHF (congestive heart failure) (HCC) 07/26/2015   a.) TTE 09/15/2018: EF 50-55%, mod LVH, RVE, BAE, mild-mod TR, AoV sclerosis, G1DD; b.) MPI 10/27/2019: EF <30%; c.) TTE 03/21/2021: EF 35-40%, post AK, inf HK, mod LVH, mod red RVSF, mod LAE, mod Aov sclerosis, G2DD; c.) TTE 04/24/2022: EF 35-40%, post AK, glok HK, mod LVE, mod red RVSF, mild-mod MR, Aov sclerosis, G3DD   Chronic  idiopathic constipation 11/02/2018   Chronic low back pain without sciatica 12/02/2018   Chronic pain of right hip 03/31/2020   CKD (chronic kidney disease) stage 4, GFR 15-29 ml/min (HCC) 10/07/2018   Coronary artery disease    Dysphagia 07/26/2015   Erectile dysfunction 07/14/2017   Essential hypertension 02/24/2018   Foot ulcer, left (HCC)    GERD (gastroesophageal reflux disease)    Hematuria 07/27/2015   History of marijuana use    Hyperlipidemia 07/26/2015   Hypertension    Hypothyroidism 11/14/2015   Insomnia 11/02/2018   Lumbar spondylosis 03/20/2020   Malaise and fatigue 03/25/2018   Myocardial infarction due to demand ischemia (HCC) 09/26/2020   a.) Type II NSTEMI; b.) troponins were trended 0.54 --> 0.56 --> 0.52 ng/mL   Non-compliance 05/05/2019   Osseous and subluxation stenosis of intervertebral foramina of lumbar region 03/20/2020   Osteoarthritis 10/06/2019   Peripheral vascular disease (HCC)    Personal history of tobacco use, presenting hazards to health 09/27/2020   Polyneuropathy in diabetes (HCC) 05/07/2019   Pulmonary HTN (HCC)    a.) TTE 05/12/2019: PASP 71; b.) TTE 09/27/2020: PASP >70; c.) TTE 03/21/2021: RVSP 43; d.) TTE 04/24/2022: RVSP 80.6   Puncture wound of right hip 04/02/2018   PVD (peripheral vascular disease) with claudication (HCC)    a.) s/p PTA 03/02/2018 - balloon angioplasty LEFT below knee popliteal artery; b.) s/p PTA 09/30/2022: baloon  angioplasty LEFT tibioperoneal trunck, most proximal peroneal artery, and LEFT popliteal artery.   S/P CABG x 4 2003   Sleep apnea 07/26/2015   a.) does not require nocturnal PAP therapy   Subacute osteomyelitis of left foot (HCC) 03/25/2018   Thrombocytopenia (HCC)    Type 2 diabetes mellitus with stage 4 chronic kidney disease, with long-term current use of insulin (HCC) 07/26/2015   Vitamin B12 deficiency 06/16/2019   Vitamin D deficiency 05/07/2018      Dispostion: Discharged in stable condition.   Return precaution discussed.  Patient voices understanding and is in agreement with plan.   Final Clinical Impression(s) / ED Diagnoses Final diagnoses:  Acute on chronic congestive heart failure, unspecified heart failure type (HCC)  AKI (acute kidney injury) [facility])    Rx / DC Orders ED Discharge Orders     None         Marita Kansas, PA-C 08/12/23 1023    Wynetta Fines, MD 08/12/23 1547

## 2023-08-12 NOTE — Consult Note (Signed)
 [facility] Renal Consultation Note  Requesting MD: EDP Indication for Consultation: AKI  HPI:  Phillip [name] is a 68 y.o. male with PMH CKD stage IV-5 2/2 diabetic nephropathy, recurrent AKI, HFrEF, HTN, NSTEMI, CAD s/p CABG, PVD, T2DM, hypothyroidusm who presented with increased dyspnea and weeping extremity edema for several weeks despite lasix.  He states that over the last ~1 month he has noticed increased LE edema that is now nearly at the hip level on bilateral LE. Normally he does not have edema beyond the knees. He has maintained good urine output. He increased his bumex dose to BID as instructed about 1 month ago but thinks that his edema actually gotten worse.  On chart review, it seems that he experiences recurrent AKI and fluid management issues. In a hospital stay 01/28-02/02 he received aggressive IV diuresis with good results and was discharged on bumetanide 3 mg BID with notes that he was actually only taking this once daily; he increased to BID dosing about 1 month ago as instructed at an outpatient follow up visit. Per notes, he has been counseled that he is approaching the need for HD, however this seems to be news to him and he stated to me that he does not wish to be on hemodialysis.  Labs reviewed and notable for BUN/Cr 128/6.17 (baseline in 3's, recently consistently 5+) with GFR 9; stable normocytic anemia with Hgb 9.8; BNP elevated at 865.0. CXR without pulmonary edema.   Creatinine  Date/Time Value Ref Range Status  08/30/2013 10:25 AM 1.15 0.60 - 1.30 mg/dL Final   Creatinine, Ser  Date/Time Value Ref Range Status  08/11/2023 07:33 PM 6.17 (H) 0.61 - 1.24 mg/dL Final  65/78/4696 29:52 PM 4.71 (H) 0.76 - 1.27 mg/dL Final  84/13/2440 10:27 PM 3.46 (H) 0.76 - 1.27 mg/dL Final  25/36/6440 34:74 AM 3.67 (H) 0.61 - 1.24 mg/dL Final  25/95/6387 56:43 PM 3.99 (H) 0.61 - 1.24 mg/dL Final  32/95/1884 16:60 PM 2.52 (H) 0.76 - 1.27 mg/dL Final  63/06/6008  93:23 AM 3.14 (H) 0.61 - 1.24 mg/dL Final  55/73/2202 54:27 AM 3.53 (H) 0.61 - 1.24 mg/dL Final  11/24/7626 31:51 AM 3.51 (H) 0.61 - 1.24 mg/dL Final  76/16/0737 10:62 AM 3.57 (H) 0.61 - 1.24 mg/dL Final  69/48/5462 70:35 PM 3.54 (H) 0.61 - 1.24 mg/dL Final  00/93/8182 99:37 AM 2.72 (H) 0.61 - 1.24 mg/dL Final  16/96/7893 81:01 AM 2.93 (H) 0.61 - 1.24 mg/dL Final  75/03/2584 27:78 AM 2.72 (H) 0.61 - 1.24 mg/dL Final  24/23/5361 44:31 AM 3.20 (H) 0.61 - 1.24 mg/dL Final  54/00/8676 19:50 PM 3.55 (H) 0.61 - 1.24 mg/dL Final  93/26/7124 58:09 AM 2.42 (H) 0.76 - 1.27 mg/dL Final  98/33/8250 53:97 PM 2.38 (H) 0.76 - 1.27 mg/dL Final  67/34/1937 90:24 AM 2.53 (H) 0.76 - 1.27 mg/dL Final  09/73/5329 92:42 PM 1.86 (H) 0.61 - 1.24 mg/dL Final  68/34/1962 22:97 AM 2.77 (H) 0.61 - 1.24 mg/dL Final  98/92/1194 17:40 AM 4.05 (H) 0.61 - 1.24 mg/dL Final  81/44/8185 63:14 AM 4.59 (H) 0.61 - 1.24 mg/dL Final  97/07/6376 58:85 AM 4.87 (H) 0.61 - 1.24 mg/dL Final  02/77/4128 78:67 AM 4.86 (H) 0.61 - 1.24 mg/dL Final  67/20/9470 96:28 PM 4.68 (H) 0.61 - 1.24 mg/dL Final  36/62/9476 54:65 AM 4.24 (H) 0.61 - 1.24 mg/dL Final  03/54/6568 12:75 AM 4.19 (H) 0.61 - 1.24 mg/dL Final  17/00/1749 44:96 AM 4.16 (H) 0.61 - 1.24 mg/dL Final  09/18/2018 05:49 AM 4.49 (H) 0.61 - 1.24 mg/dL Final  16/03/9603 54:09 AM 4.87 (H) 0.61 - 1.24 mg/dL Final  81/19/1478 29:56 AM 4.85 (H) 0.61 - 1.24 mg/dL Final  21/30/8657 84:69 AM 5.36 (H) 0.61 - 1.24 mg/dL Final  62/95/2841 32:44 PM 5.32 (H) 0.61 - 1.24 mg/dL Final  06/05/7251 66:44 AM 6.08 (H) 0.61 - 1.24 mg/dL Final  03/47/4259 56:38 AM 7.45 (H) 0.61 - 1.24 mg/dL Final  75/64/3329 51:88 AM 7.93 (H) 0.61 - 1.24 mg/dL Final  41/66/0630 16:01 AM 7.02 (H) 0.61 - 1.24 mg/dL Final  09/32/3557 32:20 AM 5.15 (H) 0.61 - 1.24 mg/dL Final  25/42/7062 37:62 AM 2.80 (H) 0.61 - 1.24 mg/dL Final  83/15/1761 60:73 PM 1.89 (H) 0.61 - 1.24 mg/dL Final  71/11/2692 85:46 AM 2.23 (H) 0.61 -  1.24 mg/dL Final   PMH: Past Medical History:  Diagnosis Date   Abdominopelvic abscess (HCC) 09/29/2018   Abscess of appendix 09/22/2018   Acquired spondylolisthesis of lumbosacral region 03/20/2020   Anemia of chronic disease 08/18/2019   Aortic atherosclerosis (HCC)    Aortic stenosis    a.) TTE 09/15/2018: mild-mod (MPG 19); b.) TTE 03/21/2021: mild-mod (MPG 14.3); c.) TTE 04/24/2022: mild- mod (MPG 15)   Appendicitis 09/08/2018   Asthma    Benign hypertension with CKD (chronic kidney disease) stage IV (HCC) 02/24/2018   Benign prostatic hyperplasia without lower urinary tract symptoms 01/14/2018   Bilateral lower extremity edema 11/04/2018   Bradycardia 03/25/2018   Bruit of right carotid artery 07/26/2015   CAD (coronary artery disease) 2003   a.) s/p 4v CABG 2003   Cardiac murmur 07/26/2015   Chronic combined systolic and diastolic CHF (congestive heart failure) (HCC) 07/26/2015   a.) TTE 09/15/2018: EF 50-55%, mod LVH, RVE, BAE, mild-mod TR, AoV sclerosis, G1DD; b.) MPI 10/27/2019: EF <30%; c.) TTE 03/21/2021: EF 35-40%, post AK, inf HK, mod LVH, mod red RVSF, mod LAE, mod Aov sclerosis, G2DD; c.) TTE 04/24/2022: EF 35-40%, post AK, glok HK, mod LVE, mod red RVSF, mild-mod MR, Aov sclerosis, G3DD   Chronic idiopathic constipation 11/02/2018   Chronic low back pain without sciatica 12/02/2018   Chronic pain of right hip 03/31/2020   CKD (chronic kidney disease) stage 4, GFR 15-29 ml/min (HCC) 10/07/2018   Coronary artery disease    Dysphagia 07/26/2015   Erectile dysfunction 07/14/2017   Essential hypertension 02/24/2018   Foot ulcer, left (HCC)    GERD (gastroesophageal reflux disease)    Hematuria 07/27/2015   History of marijuana use    Hyperlipidemia 07/26/2015   Hypertension    Hypothyroidism 11/14/2015   Insomnia 11/02/2018   Lumbar spondylosis 03/20/2020   Malaise and fatigue 03/25/2018   Myocardial infarction due to demand ischemia (HCC) 09/26/2020   a.) Type  II NSTEMI; b.) troponins were trended 0.54 --> 0.56 --> 0.52 ng/mL   Non-compliance 05/05/2019   Osseous and subluxation stenosis of intervertebral foramina of lumbar region 03/20/2020   Osteoarthritis 10/06/2019   Peripheral vascular disease (HCC)    Personal history of tobacco use, presenting hazards to health 09/27/2020   Polyneuropathy in diabetes (HCC) 05/07/2019   Pulmonary HTN (HCC)    a.) TTE 05/12/2019: PASP 71; b.) TTE 09/27/2020: PASP >70; c.) TTE 03/21/2021: RVSP 43; d.) TTE 04/24/2022: RVSP 80.6   Puncture wound of right hip 04/02/2018   PVD (peripheral vascular disease) with claudication (HCC)    a.) s/p PTA 03/02/2018 - balloon angioplasty LEFT below knee popliteal artery; b.) s/p PTA 09/30/2022:  baloon angioplasty LEFT tibioperoneal trunck, most proximal peroneal artery, and LEFT popliteal artery.   S/P CABG x 4 2003   Sleep apnea 07/26/2015   a.) does not require nocturnal PAP therapy   Subacute osteomyelitis of left foot (HCC) 03/25/2018   Thrombocytopenia (HCC)    Type 2 diabetes mellitus with stage 4 chronic kidney disease, with long-term current use of insulin (HCC) 07/26/2015   Vitamin B12 deficiency 06/16/2019   Vitamin D deficiency 05/07/2018   PSH: Past Surgical History:  Procedure Laterality Date   AMPUTATION TOE Left 03/13/2018   Procedure: AMPUTATION TOE-MPJ;  Surgeon: Gwyneth Revels, DPM;  Location: [facility];  Service: Podiatry;  Laterality: Left;   ANGIOPLASTY     CARDIAC CATHETERIZATION     CORONARY ARTERY BYPASS GRAFT N/A 2003   LAPAROSCOPIC APPENDECTOMY N/A 09/08/2018   Procedure: APPENDECTOMY LAPAROSCOPIC;  Surgeon: Leafy Ro, MD;  Location: [facility];  Service: General;  Laterality: N/A;   LOWER EXTREMITY ANGIOGRAPHY Left 03/02/2018   Procedure: LOWER EXTREMITY ANGIOGRAPHY;  Surgeon: Annice Needy, MD;  Location: [facility];  Service: Cardiovascular;  Laterality: Left;   LOWER EXTREMITY ANGIOGRAPHY Left 09/30/2022   Procedure: Lower  Extremity Angiography;  Surgeon: Annice Needy, MD;  Location: [facility];  Service: Cardiovascular;  Laterality: Left;   RIGHT/LEFT HEART CATH AND CORONARY/GRAFT ANGIOGRAPHY N/A 08/01/2023   Procedure: RIGHT/LEFT HEART CATH AND CORONARY/GRAFT ANGIOGRAPHY;  Surgeon: Orbie Pyo, MD;  Location: [facility];  Service: Cardiovascular;  Laterality: N/A;   ROTATOR CUFF REPAIR Left    Family Hx:  Family History  Problem Relation Age of Onset   Hyperlipidemia Mother    Hypertension Mother    Diabetes Mother    Heart attack Brother    Heart disease Brother    Lung disease Father    Social History:  reports that he quit smoking about 22 years ago. His smoking use included cigarettes. He started smoking about 47 years ago. He has a 12.5 pack-year smoking history. He has never used smokeless tobacco. He reports that he does not currently use alcohol. He reports that he does not currently use drugs after having used the following drugs: Marijuana.  Allergies: No Known Allergies  Medications: Prior to Admission medications   Medication Sig Start Date End Date Taking? Authorizing Provider  acetaminophen (TYLENOL) 500 MG tablet Take 1,000 mg by mouth every 6 (six) hours as needed for moderate pain or headache.     [provider]  amLODipine (NORVASC) 5 MG tablet Take 1 tablet (5 mg total) by mouth daily. 05/16/23 08/14/23  Flossie Dibble, NP  aspirin EC 81 MG tablet Take 1 tablet (81 mg total) by mouth daily. Swallow whole. 07/25/23   Orbie Pyo, MD  atorvastatin (LIPITOR) 20 MG tablet Take 1 tablet (20 mg total) by mouth daily. 07/25/23   Orbie Pyo, MD  Blood Glucose Monitoring Suppl (TGT BLOOD GLUCOSE MONITORING) w/Device KIT 3 (three) times daily. 09/20/21   [provider]  bumetanide (BUMEX) 2 MG tablet Take 1 tablet (2 mg total) by mouth 2 (two) times daily. Take an extra tablet for weight gain of 3 lbs or greater per day. 07/15/23   Flossie Dibble, NP  carvedilol (COREG) 3.125 MG tablet Take 1 tablet (3.125 mg total) by mouth 2 (two) times daily. 05/20/23 08/18/23  Flossie Dibble, NP  cloNIDine (CATAPRES) 0.3 MG tablet Take 1 tablet (0.3 mg total) by mouth at bedtime.  07/14/23   Flossie Dibble, NP  clopidogrel (PLAVIX) 75 MG tablet Take 1 tablet (75 mg total) by mouth daily. 11/20/20   Revankar, Aundra Dubin, MD  cyclobenzaprine (FLEXERIL) 10 MG tablet Take 10 mg by mouth 3 (three) times daily as needed for muscle spasms.    [provider]  gabapentin (NEURONTIN) 600 MG tablet Take 600 mg by mouth 2 (two) times daily.    [provider]  Insulin Glargine (BASAGLAR KWIKPEN) 100 UNIT/ML Inject 7 Units into the skin at bedtime. 07/18/23 07/17/24  [provider]  isosorbide mononitrate (IMDUR) 120 MG 24 hr tablet Take 1 tablet by mouth once daily 08/11/23   Revankar, Aundra Dubin, MD  Lancets MISC 1 each by miscellaneous route 2 (two) times a day. 09/20/21   [provider]  latanoprost (XALATAN) 0.005 % ophthalmic solution Place 1 drop into both eyes at bedtime. 04/15/23   [provider]  levothyroxine (SYNTHROID) 50 MCG tablet Take 50 mcg by mouth daily. 04/05/23   [provider]  nitroGLYCERIN (NITROSTAT) 0.4 MG SL tablet Place 1 tablet (0.4 mg total) under the tongue every 5 (five) minutes as needed for chest pain. 11/20/20   Revankar, Aundra Dubin, MD  tamsulosin (FLOMAX) 0.4 MG CAPS capsule Take 0.4 mg by mouth daily after breakfast. 04/19/20   [provider]  vitamin B-12 (CYANOCOBALAMIN) 500 MCG tablet Take 500 mcg by mouth daily.     [provider]  Vitamin D, Ergocalciferol, (DRISDOL) 1.25 MG (50000 UNIT) CAPS capsule Take 50,000 Units by mouth once a week. Mondays 07/13/21   [provider]    I have reviewed the patient's current medications.  Labs:  Results for orders placed or performed during the hospital encounter of 08/11/23 (from the past 48 hours)  Basic  metabolic panel     Status: Abnormal   Collection Time: 08/11/23  7:33 PM  Result Value Ref Range   Sodium 141 135 - 145 mmol/L   Potassium 5.0 3.5 - 5.1 mmol/L   Chloride 108 98 - 111 mmol/L   CO2 21 (L) 22 - 32 mmol/L   Glucose, Bld 112 (H) 70 - 99 mg/dL    Comment: Glucose reference range applies only to samples taken after fasting for at least 8 hours.   BUN 128 (H) 8 - 23 mg/dL   Creatinine, Ser 4.40 (H) 0.61 - 1.24 mg/dL   Calcium 7.7 (L) 8.9 - 10.3 mg/dL   GFR, Estimated 9 (L) >60 mL/min    Comment: (NOTE) Calculated using the CKD-EPI Creatinine Equation (2021)    Anion gap 12 5 - 15    Comment: Performed at [facility], [address], [place], [place] [address]  CBC with Differential     Status: Abnormal   Collection Time: 08/11/23  7:33 PM  Result Value Ref Range   WBC 5.7 4.0 - 10.5 K/uL   RBC 3.28 (L) 4.22 - 5.81 MIL/uL   Hemoglobin 9.8 (L) 13.0 - 17.0 g/dL   HCT 59.5 (L) 63.8 - 75.6 %   MCV 94.2 80.0 - 100.0 fL   MCH 29.9 26.0 - 34.0 pg   MCHC 31.7 30.0 - 36.0 g/dL   RDW 43.3 29.5 - 18.8 %   Platelets 115 (L) 150 - 400 K/uL   nRBC 0.0 0.0 - 0.2 %   Neutrophils Relative % 77 %   Neutro Abs 4.4 1.7 - 7.7 K/uL   Lymphocytes Relative 10 %   Lymphs Abs  0.6 (L) 0.7 - 4.0 K/uL   Monocytes Relative 9 %   Monocytes Absolute 0.5 0.1 - 1.0 K/uL   Eosinophils Relative 2 %   Eosinophils Absolute 0.1 0.0 - 0.5 K/uL   Basophils Relative 1 %   Basophils Absolute 0.0 0.0 - 0.1 K/uL   Immature Granulocytes 1 %   Abs Immature Granulocytes 0.03 0.00 - 0.07 K/uL    Comment: Performed at [facility], [address] [place], [place] [address]  Brain natriuretic peptide     Status: Abnormal   Collection Time: 08/11/23  7:33 PM  Result Value Ref Range   B Natriuretic Peptide 865.0 (H) 0.0 - 100.0 pg/mL    Comment: Performed at [facility], [address], [place], [place] [address]   ROS: Pertinent items are noted in HPI.  Physical Exam: Vitals:    08/12/23 0600 08/12/23 0602  BP: (!) 146/79 (!) 140/86  Pulse: 77 67  Resp: 18 12  Temp: 97.6 F (36.4 C) (!) 97.5 F (36.4 C)  SpO2: 95% 98%     General:Appears stated age, resting comfortably in NAD. Heart:Regular rate and rhythm. Murmur appreciated over LUSB. Lungs:Clear to auscultation bilaterally. Normal work of breathing on room air. Abdomen:Soft, nontender, nondistended. Extremities:2+ pitting edema of bilateral LE to just distal to hips. Skin:Warm and dry. Neuro:No focal deficit noted.  Assessment/Plan: 1.AKI on CKD stage IV-V, volume: Concern for cardiorenal syndrome given overt volume overload/clinical signs of CHF exacerbation. Lasix 120 mg BID ordered. Trend daily renal function panel. 2. Hypertension: Continue home amlodipine, carvedilol, clonidine, imdur. 4. Anemia: Stable compared to prior. Will check iron studies.   [name], DO Internal Medicine PGY-3 08/12/2023, 9:34 AM

## 2023-08-12 NOTE — Progress Notes (Signed)
 I spoke with Phillip [name]'s wife, Phillip [name], to provide an update on care plan. She is understanding and thankful that he is getting IV medicines for his fluid overload. She explains that it is very hard to watch him go through this (re: fluid overload) because his quality of life is impacted by it--he struggles to breathe well, his movement is limited, he even has to pick his legs up and move them when getting in and out of vehicles. Their daughter died at the age of 10 in their home due to some condition causing volume overload, and she says that it feels like reliving that all over again.   We discussed [name]'s wishes to avoid hemodialysis. She confirms that he has always said he would not want hemodialysis, but that this is a conversation she would like to have further with him now that his [name] function is as severe as it is.  She had no further questions at this time. I encouraged her to reach out to the care team should any arise, and assured ehr that I would keep her updated as needed regarding his nephrology care.  [name], DO Internal Medicine PGY-3

## 2023-08-12 NOTE — ED Notes (Signed)
 Awaiting Lasix dose from pharmacy.

## 2023-08-12 NOTE — ED Notes (Signed)
 Lunch tray delivered for pt

## 2023-08-12 NOTE — ED Notes (Signed)
 Food tray ordered at this time.

## 2023-08-12 NOTE — ED Notes (Signed)
 Pharmacy messaged regarding missing Bumex dose

## 2023-08-12 NOTE — H&P (Signed)
 History and Physical    Patient: Phillip [name]:[id number] DOB: February 15, 1956 DOA: 08/11/2023 DOS: the patient was seen and examined on 08/12/2023 PCP: Gordan Payment., MD  Patient coming from: Home  Chief Complaint:  Chief Complaint  Patient presents with   Shortness of Breath   Leg Swelling   HPI: Phillip [name] is a 68 y.o. male with medical history significant of HFrEF,  HTN, history of NSTEMI, CAD s/p CABG in 2003, PVD, T2DM, neuropathy, anemia, CKD stage IV, and hypothyroidism who presents with progressively worsening shortness of breath and leg swelling.  leg swelling and weight gain.  He has experienced leg swelling for approximately three weeks to a month, accompanied by significant weight gain from 212 pounds to approximately 230 pounds. He experiences shortness of breath when walking and prefers not to lie flat, sometimes sleeping sitting up due to discomfort. He describes feeling like he is 'suffocating' when lying flat. He continues to make urine.  He patient reports that he has not had any improvement in his leg swelling.  He has not had a fistula placed for dialysis and is reluctant towards the idea of dialysis, indicating he has not discussed this with his family yet.  Records note patient had just recently underwent right and left heart cath on 2/28 to  workup for TAVR.  On admission to the emergency department patient was noted to be afebrile, pulse 55-77, and all other vital signs relatively maintained.  Labs significant for hemoglobin 9.8, platelets 115, potassium 5, BUN 128, creatinine 6.17, anion gap 12, and BNP 865.  Chest x-ray noted hypoventilation with borderline cardiomegaly.  Nephrology have been formally consulted.  Patient had been given Bumex 2 mg IV.  Review of Systems: As mentioned in the history of present illness. All other systems reviewed and are negative. Past Medical History:  Diagnosis Date   Abdominopelvic abscess (HCC) 09/29/2018   Abscess  of appendix 09/22/2018   Acquired spondylolisthesis of lumbosacral region 03/20/2020   Anemia of chronic disease 08/18/2019   Aortic atherosclerosis (HCC)    Aortic stenosis    a.) TTE 09/15/2018: mild-mod (MPG 19); b.) TTE 03/21/2021: mild-mod (MPG 14.3); c.) TTE 04/24/2022: mild- mod (MPG 15)   Appendicitis 09/08/2018   Asthma    Benign hypertension with CKD (chronic kidney disease) stage IV (HCC) 02/24/2018   Benign prostatic hyperplasia without lower urinary tract symptoms 01/14/2018   Bilateral lower extremity edema 11/04/2018   Bradycardia 03/25/2018   Bruit of right carotid artery 07/26/2015   CAD (coronary artery disease) 2003   a.) s/p 4v CABG 2003   Cardiac murmur 07/26/2015   Chronic combined systolic and diastolic CHF (congestive heart failure) (HCC) 07/26/2015   a.) TTE 09/15/2018: EF 50-55%, mod LVH, RVE, BAE, mild-mod TR, AoV sclerosis, G1DD; b.) MPI 10/27/2019: EF <30%; c.) TTE 03/21/2021: EF 35-40%, post AK, inf HK, mod LVH, mod red RVSF, mod LAE, mod Aov sclerosis, G2DD; c.) TTE 04/24/2022: EF 35-40%, post AK, glok HK, mod LVE, mod red RVSF, mild-mod MR, Aov sclerosis, G3DD   Chronic idiopathic constipation 11/02/2018   Chronic low back pain without sciatica 12/02/2018   Chronic pain of right hip 03/31/2020   CKD (chronic kidney disease) stage 4, GFR 15-29 ml/min (HCC) 10/07/2018   Coronary artery disease    Dysphagia 07/26/2015   Erectile dysfunction 07/14/2017   Essential hypertension 02/24/2018   Foot ulcer, left (HCC)    GERD (gastroesophageal reflux disease)    Hematuria 07/27/2015   History of  marijuana use    Hyperlipidemia 07/26/2015   Hypertension    Hypothyroidism 11/14/2015   Insomnia 11/02/2018   Lumbar spondylosis 03/20/2020   Malaise and fatigue 03/25/2018   Myocardial infarction due to demand ischemia [facility]) 09/26/2020   a.) Type II NSTEMI; b.) troponins were trended 0.54 --> 0.56 --> 0.52 ng/mL   Non-compliance 05/05/2019   Osseous and  subluxation stenosis of intervertebral foramina of lumbar region 03/20/2020   Osteoarthritis 10/06/2019   Peripheral vascular disease (HCC)    Personal history of tobacco use, presenting hazards to health 09/27/2020   Polyneuropathy in diabetes (HCC) 05/07/2019   Pulmonary HTN (HCC)    a.) TTE 05/12/2019: PASP 71; b.) TTE 09/27/2020: PASP >70; c.) TTE 03/21/2021: RVSP 43; d.) TTE 04/24/2022: RVSP 80.6   Puncture wound of right hip 04/02/2018   PVD (peripheral vascular disease) with claudication (HCC)    a.) s/p PTA 03/02/2018 - balloon angioplasty LEFT below knee popliteal artery; b.) s/p PTA 09/30/2022: baloon angioplasty LEFT tibioperoneal trunck, most proximal peroneal artery, and LEFT popliteal artery.   S/P CABG x 4 2003   Sleep apnea 07/26/2015   a.) does not require nocturnal PAP therapy   Subacute osteomyelitis of left foot (HCC) 03/25/2018   Thrombocytopenia (HCC)    Type 2 diabetes mellitus with stage 4 chronic kidney disease, with long-term current use of insulin (HCC) 07/26/2015   Vitamin B12 deficiency 06/16/2019   Vitamin D deficiency 05/07/2018   Past Surgical History:  Procedure Laterality Date   AMPUTATION TOE Left 03/13/2018   Procedure: AMPUTATION TOE-MPJ;  Surgeon: Gwyneth Revels, DPM;  Location: [facility] ORS;  Service: Podiatry;  Laterality: Left;   ANGIOPLASTY     CARDIAC CATHETERIZATION     CORONARY ARTERY BYPASS GRAFT N/A 2003   LAPAROSCOPIC APPENDECTOMY N/A 09/08/2018   Procedure: APPENDECTOMY LAPAROSCOPIC;  Surgeon: Leafy Ro, MD;  Location: [facility] ORS;  Service: General;  Laterality: N/A;   LOWER EXTREMITY ANGIOGRAPHY Left 03/02/2018   Procedure: LOWER EXTREMITY ANGIOGRAPHY;  Surgeon: Annice Needy, MD;  Location: [facility];  Service: Cardiovascular;  Laterality: Left;   LOWER EXTREMITY ANGIOGRAPHY Left 09/30/2022   Procedure: Lower Extremity Angiography;  Surgeon: Annice Needy, MD;  Location: [facility];  Service: Cardiovascular;   Laterality: Left;   RIGHT/LEFT HEART CATH AND CORONARY/GRAFT ANGIOGRAPHY N/A 08/01/2023   Procedure: RIGHT/LEFT HEART CATH AND CORONARY/GRAFT ANGIOGRAPHY;  Surgeon: Orbie Pyo, MD;  Location: [facility];  Service: Cardiovascular;  Laterality: N/A;   ROTATOR CUFF REPAIR Left    Social History:  reports that he quit smoking about 22 years ago. His smoking use included cigarettes. He started smoking about 47 years ago. He has a 12.5 pack-year smoking history. He has never used smokeless tobacco. He reports that he does not currently use alcohol. He reports that he does not currently use drugs after having used the following drugs: Marijuana.  No Known Allergies  Family History  Problem Relation Age of Onset   Hyperlipidemia Mother    Hypertension Mother    Diabetes Mother    Heart attack Brother    Heart disease Brother    Lung disease Father     Prior to Admission medications   Medication Sig Start Date End Date Taking? Authorizing Provider  acetaminophen (TYLENOL) 500 MG tablet Take 1,000 mg by mouth every 6 (six) hours as needed for moderate pain or headache.     [provider]  amLODipine (NORVASC) 5 MG tablet Take  1 tablet (5 mg total) by mouth daily. 05/16/23 08/14/23  Flossie Dibble, NP  aspirin EC 81 MG tablet Take 1 tablet (81 mg total) by mouth daily. Swallow whole. 07/25/23   Orbie Pyo, MD  atorvastatin (LIPITOR) 20 MG tablet Take 1 tablet (20 mg total) by mouth daily. 07/25/23   Orbie Pyo, MD  Blood Glucose Monitoring Suppl (TGT BLOOD GLUCOSE MONITORING) w/Device KIT 3 (three) times daily. 09/20/21   [provider]  bumetanide (BUMEX) 2 MG tablet Take 1 tablet (2 mg total) by mouth 2 (two) times daily. Take an extra tablet for weight gain of 3 lbs or greater per day. 07/15/23   Flossie Dibble, NP  carvedilol (COREG) 3.125 MG tablet Take 1 tablet (3.125 mg total) by mouth 2 (two) times daily. 05/20/23 08/18/23  Flossie Dibble, NP   cloNIDine (CATAPRES) 0.3 MG tablet Take 1 tablet (0.3 mg total) by mouth at bedtime. 07/14/23   Flossie Dibble, NP  clopidogrel (PLAVIX) 75 MG tablet Take 1 tablet (75 mg total) by mouth daily. 11/20/20   Revankar, Aundra Dubin, MD  cyclobenzaprine (FLEXERIL) 10 MG tablet Take 10 mg by mouth 3 (three) times daily as needed for muscle spasms.    [provider]  gabapentin (NEURONTIN) 600 MG tablet Take 600 mg by mouth 2 (two) times daily.    [provider]  Insulin Glargine (BASAGLAR KWIKPEN) 100 UNIT/ML Inject 7 Units into the skin at bedtime. 07/18/23 07/17/24  [provider]  isosorbide mononitrate (IMDUR) 120 MG 24 hr tablet Take 1 tablet by mouth once daily 08/11/23   Revankar, Aundra Dubin, MD  Lancets MISC 1 each by miscellaneous route 2 (two) times a day. 09/20/21   [provider]  latanoprost (XALATAN) 0.005 % ophthalmic solution Place 1 drop into both eyes at bedtime. 04/15/23   [provider]  levothyroxine (SYNTHROID) 50 MCG tablet Take 50 mcg by mouth daily. 04/05/23   [provider]  nitroGLYCERIN (NITROSTAT) 0.4 MG SL tablet Place 1 tablet (0.4 mg total) under the tongue every 5 (five) minutes as needed for chest pain. 11/20/20   Revankar, Aundra Dubin, MD  tamsulosin (FLOMAX) 0.4 MG CAPS capsule Take 0.4 mg by mouth daily after breakfast. 04/19/20   [provider]  vitamin B-12 (CYANOCOBALAMIN) 500 MCG tablet Take 500 mcg by mouth daily.     [provider]  Vitamin D, Ergocalciferol, (DRISDOL) 1.25 MG (50000 UNIT) CAPS capsule Take 50,000 Units by mouth once a week. Mondays 07/13/21   [provider]    Physical Exam: Vitals:   08/11/23 2305 08/11/23 2306 08/12/23 0600 08/12/23 0602  BP: (!) 146/81  (!) 146/79 (!) 140/86  Pulse: (!) 55  77 67  Resp: 16  18 12   Temp: 97.9 F (36.6 C)  97.6 F (36.4 C) (!) 97.5 F (36.4 C)  TempSrc: Oral     SpO2: 98% 98% 95% 98%  Weight:      Height:        Constitutional: Elderly male currently sitting up in bed NAD, calm, comfortable Eyes: PERRL, lids and conjunctivae normal ENMT: Mucous membranes are moist.  Normal dentition.  Neck: normal, supple, JVD present to the angle of the jaw Respiratory: clear to auscultation bilaterally, no wheezing, no crackles. Normal respiratory effort. No accessory muscle use.  Cardiovascular: Regular rate and rhythm, no murmurs / rubs / gallops.  3+ pitting edema.. 2+ pedal pulses.   Abdomen: no tenderness, no masses  palpated. No hepatosplenomegaly. Bowel sounds positive.  Musculoskeletal: no clubbing / cyanosis. No joint deformity upper and lower extremities. Good ROM, no contractures. Normal muscle tone.  Skin: no rashes, lesions, ulcers. No induration Neurologic: CN 2-12 grossly intact. Sensation intact, DTR normal. Strength 5/5 in all 4.  Psychiatric: Normal judgment and insight. Alert and oriented x 3. Normal mood.   Data Reviewed:   EKG revealed normal sinus rhythm at 67 bpm with left anterior fascicular block.reviewed labs, imaging, and pertinent records as documented.  Assessment and Plan:  Heart failure with reduced ejection fraction Acute on chronic.  Patient presents with complaints of progressively worsening shortness of breath and leg swelling and reports of approximately 20 pound weight gain.  On physical exam patient with significant lower extremity edema and JVD present.  BNP elevated at 865.  Chest x-ray noted hypoventilation with borderline cardiomegaly.  Patient had been given Bumex 2 mg IV. -Admit to a cardiac telemetry bed -Heart failure order set utilized -Strict I&O -Continue Lasix 120 mg IV twice daily -Appreciate cardiology and nephrology consultative services we will follow-up for any further recommendations  Acute kidney injury superimposed on chronic kidney disease stage IV On admission creatinine elevated up to 6.17 with BUN 28.  Patient reportedly still makes urine.   Baseline creatinine previously had been around 3-4.  Nephrology have been formally consulted.  Patient makes note he does not wish to be on hemodialysis at this current time.. -Strict I&Os -Continue to monitor kidney function -Appreciate nephrology consultative services -Palliative care consulted  CAD Peripheral vascular disease Patient with prior history history of four-vessel CABG back in 2003.  Patient had left heart cath 07/2023 which showed patent LIMA to LAD but all 3 vein grafts occluded. -Continue aspirin, Plavix, beta-blocker  Essential hypertension Blood pressures currently maintained 138/76 to 146/81 -Continue amlodipine, Coreg, clonidine, isosorbide mononitrate  Aortic stenosis Plan had been for further workup for TAVR in the outpatient setting   Diabetes mellitus type 2, with long-term use of insulin Last available hemoglobin A1c was 7.2 when last checked on 12/13 -Hypoglycemic protocols -Continue home long-acting insulin -CBGs before every meal with very sensitive SSI -Adjust medication regimen as needed  Pancytopenia Chronic.  Hemoglobin 9.8 and platelet count 115 which appears around patient's baseline. -Continue to monitor  Hyperlipidemia -Continue atorvastatin  Hypothyroidism TSH was noted to be 3.99 when checked on 07/01/2023. -Add on TSH -Continue levothyroxine  BPH -Continue Flomax DVT prophylaxis: Heparin  Advance Care Planning:   Code Status: Full Code  Consults: Cardiology and nephrology  Family Communication:   Severity of Illness: The appropriate patient status for this patient is OBSERVATION. Observation status is judged to be reasonable and necessary in order to provide the required intensity of service to ensure the patient's safety. The patient's presenting symptoms, physical exam findings, and initial radiographic and laboratory data in the context of their medical condition is felt to place them at decreased risk for further clinical  deterioration. Furthermore, it is anticipated that the patient will be medically stable for discharge from the hospital within 2 midnights of admission.   Author: Clydie Braun, MD 08/12/2023 8:59 AM  For on call review [email or web address].

## 2023-08-12 NOTE — Consult Note (Addendum)
 Cardiology Consultation   Patient ID: Phillip [name] MRN: [id number]; DOB: Oct 15, 1955  Admit date: 08/11/2023 Date of Consult: 08/12/2023  PCP:  Gordan Payment., MD   [facility] Providers Cardiologist:  Garwin Brothers, MD        Patient Profile:   Phillip [name] is a 68 y.o. male with a history of CAD s/p CABG x4 in 2003, chronic combined CHF with EF of 30-35% on Echo in 06/2023, severe aortic stenosis, PAD s/p left tibioperoneal trunk and left popliteal artery in 09/2022, hypertension, hyperlipidemia, type 2 diabetes mellitus, CKD stage IV, GERD, anemia of chronic disease, BPH, obstructive sleep apnea, and chronic back pain who is being seen 08/12/2023 for the evaluation of CHF and severe aortic stenosis at the request of Dr. Katrinka Blazing.  History of Present Illness:   [name] is a 68 year old male with the above history who is followed by Dr. Tomie China. He has a history of CAD s/p remote CABG x4 in 2003 as well as PAD s/p left tibioperoneal trunk and left popliteal artery in 09/2022 (followed Vascular Surgery at [facility]). He also has a history of CHF and aortic stenosis. He was admitted in 06/2023 at [facility] for acute CHF. Echo showed LVEF of 30-35% with akinesis of the inferior and inferolateral segments, grade 2 diastolic dysfunction, low gradient severe aortic stenosis, mild mitral regurgitation, and severe pulmonary hypertension. He was aggressively diuresed and then discharged and was discharged on Bumex. He was referred to [facility] following this admission and seen by Dr. Lynnette Caffey in 07/2023 who recommended work-up for TAVR. He underwent R/ LHC on 08/01/2023 which showed patent LIMA to LAD but all 3 vein grafts were occluded. Right heart pressure were elevated (RA pressure 16 mmHg and PCWP 28 mmHg) with normal cardiac output/ index.  Patient presented to the ED on 08/11/2023 for worsening shortness of breath and lower extremity edema. He describes worsening  shortness of breath on exertion as well as lower extremity edema over the last 3-4 weeks. He also reports abdominal distension and weight gain but could not tell me how much weight he has gained. He denies any shortness of breath -at rest, but does note exertional dyspnea.. No orthopnea or PND. No chest pain, palpitations, lightheadedness, dizziness, syncope. No recent fever or illnesses. No URI symptoms or GI symptoms. No abnormal bleeding in urine or stools.  Upon arrival to the ED, vital stable. EKG showed normal sinus rhythm with inferolateral T wave inversions which have been seen on prior tracings. BNP 865. Chest x-ray showed pulmonary hypoinflation and borderline cardiomegaly. WBC 5.7, Hgb 9.8, Plts 115. Na 141, K 5.0, Glucose 112, BUN 128, Cr 6.17. He was given a dose of IV Bumex. Nephrology was consulted who recommended starting IV Lasix 120mg  twice daily. Cardiology consulted for assistance.    Past Medical History:  Diagnosis Date   Abdominopelvic abscess (HCC) 09/29/2018   Abscess of appendix 09/22/2018   Acquired spondylolisthesis of lumbosacral region 03/20/2020   Anemia of chronic disease 08/18/2019   Aortic atherosclerosis (HCC)    Aortic stenosis    a.) TTE 09/15/2018: mild-mod (MPG 19); b.) TTE 03/21/2021: mild-mod (MPG 14.3); c.) TTE 04/24/2022: mild- mod (MPG 15)   Appendicitis 09/08/2018   Asthma    Benign hypertension with CKD (chronic kidney disease) stage IV (HCC) 02/24/2018   Benign prostatic hyperplasia without lower urinary tract symptoms 01/14/2018   Bilateral lower extremity edema 11/04/2018   Bradycardia 03/25/2018   Bruit  of right carotid artery 07/26/2015   CAD (coronary artery disease) 2003   a.) s/p 4v CABG 2003   Cardiac murmur 07/26/2015   Chronic combined systolic and diastolic CHF (congestive heart failure) (HCC) 07/26/2015   a.) TTE 09/15/2018: EF 50-55%, mod LVH, RVE, BAE, mild-mod TR, AoV sclerosis, G1DD; b.) MPI 10/27/2019: EF <30%; c.) TTE  03/21/2021: EF 35-40%, post AK, inf HK, mod LVH, mod red RVSF, mod LAE, mod Aov sclerosis, G2DD; c.) TTE 04/24/2022: EF 35-40%, post AK, glok HK, mod LVE, mod red RVSF, mild-mod MR, Aov sclerosis, G3DD   Chronic idiopathic constipation 11/02/2018   Chronic low back pain without sciatica 12/02/2018   Chronic pain of right hip 03/31/2020   CKD (chronic kidney disease) stage 4, GFR 15-29 ml/min (HCC) 10/07/2018   Coronary artery disease    Dysphagia 07/26/2015   Erectile dysfunction 07/14/2017   Essential hypertension 02/24/2018   Foot ulcer, left (HCC)    GERD (gastroesophageal reflux disease)    Hematuria 07/27/2015   History of marijuana use    Hyperlipidemia 07/26/2015   Hypertension    Hypothyroidism 11/14/2015   Insomnia 11/02/2018   Lumbar spondylosis 03/20/2020   Malaise and fatigue 03/25/2018   Myocardial infarction due to demand ischemia (HCC) 09/26/2020   a.) Type II NSTEMI; b.) troponins were trended 0.54 --> 0.56 --> 0.52 ng/mL   Non-compliance 05/05/2019   Osseous and subluxation stenosis of intervertebral foramina of lumbar region 03/20/2020   Osteoarthritis 10/06/2019   Peripheral vascular disease (HCC)    Personal history of tobacco use, presenting hazards to health 09/27/2020   Polyneuropathy in diabetes (HCC) 05/07/2019   Pulmonary HTN (HCC)    a.) TTE 05/12/2019: PASP 71; b.) TTE 09/27/2020: PASP >70; c.) TTE 03/21/2021: RVSP 43; d.) TTE 04/24/2022: RVSP 80.6   Puncture wound of right hip 04/02/2018   PVD (peripheral vascular disease) with claudication (HCC)    a.) s/p PTA 03/02/2018 - balloon angioplasty LEFT below knee popliteal artery; b.) s/p PTA 09/30/2022: baloon angioplasty LEFT tibioperoneal trunck, most proximal peroneal artery, and LEFT popliteal artery.   S/P CABG x 4 2003   Sleep apnea 07/26/2015   a.) does not require nocturnal PAP therapy   Subacute osteomyelitis of left foot (HCC) 03/25/2018   Thrombocytopenia (HCC)    Type 2 diabetes mellitus with  stage 4 chronic kidney disease, with long-term current use of insulin (HCC) 07/26/2015   Vitamin B12 deficiency 06/16/2019   Vitamin D deficiency 05/07/2018    Past Surgical History:  Procedure Laterality Date   AMPUTATION TOE Left 03/13/2018   Procedure: AMPUTATION TOE-MPJ;  Surgeon: Gwyneth Revels, DPM;  Location: [facility] ORS;  Service: Podiatry;  Laterality: Left;   ANGIOPLASTY     CARDIAC CATHETERIZATION     CORONARY ARTERY BYPASS GRAFT N/A 2003   LAPAROSCOPIC APPENDECTOMY N/A 09/08/2018   Procedure: APPENDECTOMY LAPAROSCOPIC;  Surgeon: Leafy Ro, MD;  Location: [facility] ORS;  Service: General;  Laterality: N/A;   LOWER EXTREMITY ANGIOGRAPHY Left 03/02/2018   Procedure: LOWER EXTREMITY ANGIOGRAPHY;  Surgeon: Annice Needy, MD;  Location: [facility] INVASIVE CV LAB;  Service: Cardiovascular;  Laterality: Left;   LOWER EXTREMITY ANGIOGRAPHY Left 09/30/2022   Procedure: Lower Extremity Angiography;  Surgeon: Annice Needy, MD;  Location: [facility] INVASIVE CV LAB;  Service: Cardiovascular;  Laterality: Left;   RIGHT/LEFT HEART CATH AND CORONARY/GRAFT ANGIOGRAPHY N/A 08/01/2023   Procedure: RIGHT/LEFT HEART CATH AND CORONARY/GRAFT ANGIOGRAPHY;  Surgeon: Orbie Pyo, MD;  Location: [facility] INVASIVE CV LAB;  Service: Cardiovascular;  Laterality: N/A;   ROTATOR CUFF REPAIR Left      Home Medications:  Prior to Admission medications   Medication Sig Start Date End Date Taking? Authorizing Provider  acetaminophen (TYLENOL) 500 MG tablet Take 1,000 mg by mouth every 6 (six) hours as needed for moderate pain or headache.    Yes [provider]  amLODipine (NORVASC) 5 MG tablet Take 1 tablet (5 mg total) by mouth daily. 05/16/23 08/14/23 Yes Flossie Dibble, NP  aspirin EC 81 MG tablet Take 1 tablet (81 mg total) by mouth daily. Swallow whole. 07/25/23  Yes Orbie Pyo, MD  atorvastatin (LIPITOR) 20 MG tablet Take 1 tablet (20 mg total) by mouth daily. 07/25/23  Yes Orbie Pyo, MD   bumetanide (BUMEX) 2 MG tablet Take 1 tablet (2 mg total) by mouth 2 (two) times daily. Take an extra tablet for weight gain of 3 lbs or greater per day. 07/15/23  Yes Flossie Dibble, NP  carvedilol (COREG) 3.125 MG tablet Take 1 tablet (3.125 mg total) by mouth 2 (two) times daily. 05/20/23 08/18/23 Yes Flossie Dibble, NP  cloNIDine (CATAPRES) 0.3 MG tablet Take 1 tablet (0.3 mg total) by mouth at bedtime. 07/14/23  Yes Flossie Dibble, NP  clopidogrel (PLAVIX) 75 MG tablet Take 1 tablet (75 mg total) by mouth daily. 11/20/20  Yes Revankar, Aundra Dubin, MD  cyclobenzaprine (FLEXERIL) 10 MG tablet Take 10 mg by mouth 3 (three) times daily as needed for muscle spasms.   Yes [provider]  gabapentin (NEURONTIN) 600 MG tablet Take 600 mg by mouth 2 (two) times daily.   Yes [provider]  Insulin Glargine (BASAGLAR KWIKPEN) 100 UNIT/ML Inject 7 Units into the skin at bedtime. 07/18/23 07/17/24 Yes [provider]  isosorbide mononitrate (IMDUR) 120 MG 24 hr tablet Take 1 tablet by mouth once daily 08/11/23  Yes Revankar, Aundra Dubin, MD  latanoprost (XALATAN) 0.005 % ophthalmic solution Place 1 drop into both eyes at bedtime. 04/15/23  Yes [provider]  levothyroxine (SYNTHROID) 50 MCG tablet Take 50 mcg by mouth daily. 04/05/23  Yes [provider]  nitroGLYCERIN (NITROSTAT) 0.4 MG SL tablet Place 1 tablet (0.4 mg total) under the tongue every 5 (five) minutes as needed for chest pain. 11/20/20  Yes Revankar, Aundra Dubin, MD  tamsulosin (FLOMAX) 0.4 MG CAPS capsule Take 0.4 mg by mouth daily after breakfast. 04/19/20  Yes [provider]  vitamin B-12 (CYANOCOBALAMIN) 500 MCG tablet Take 500 mcg by mouth daily.    Yes [provider]  Vitamin D, Ergocalciferol, (DRISDOL) 1.25 MG (50000 UNIT) CAPS capsule Take 50,000 Units by mouth once a week. Mondays 07/13/21  Yes [provider]  Blood Glucose Monitoring Suppl (TGT BLOOD GLUCOSE  MONITORING) w/Device KIT 3 (three) times daily. 09/20/21   [provider]  Lancets MISC 1 each by miscellaneous route 2 (two) times a day. 09/20/21   [provider]    Inpatient Medications: Scheduled Meds:  amLODipine  5 mg Oral Daily   aspirin EC  81 mg Oral Daily   atorvastatin  20 mg Oral Daily   Basaglar KwikPen  7 Units Subcutaneous QHS   bumetanide  2 mg Oral BID   carvedilol  3.125 mg Oral BID   cloNIDine  0.3 mg Oral QHS   clopidogrel  75 mg Oral Daily   cyanocobalamin  500 mcg Oral Daily   gabapentin  600 mg Oral BID  heparin injection (subcutaneous)  5,000 Units Subcutaneous Q8H   insulin aspart  0-6 Units Subcutaneous TID WC   isosorbide mononitrate  120 mg Oral Daily   latanoprost  1 drop Both Eyes QHS   levothyroxine  50 mcg Oral Daily   tamsulosin  0.4 mg Oral QPC breakfast   Continuous Infusions:  PRN Meds: acetaminophen **OR** acetaminophen, albuterol, cyclobenzaprine, nitroGLYCERIN, ondansetron **OR** ondansetron (ZOFRAN) IV  Allergies:   No Known Allergies  Social History:   Social History   Socioeconomic History   Marital status: Married    Spouse name: Not on file   Number of children: Not on file   Years of education: Not on file   Highest education level: Not on file  Occupational History   Not on file  Tobacco Use   Smoking status: Former    Current packs/day: 0.00    Average packs/day: 0.5 packs/day for 25.0 years (12.5 ttl pk-yrs)    Types: Cigarettes    Start date: 26    Quit date: 2003    Years since quitting: 22.2   Smokeless tobacco: Never  Vaping Use   Vaping status: Never Used  Substance and Sexual Activity   Alcohol use: Not Currently    Comment: 2003   Drug use: Not Currently    Types: Marijuana   Sexual activity: Not on file  Other Topics Concern   Not on file  Social History Narrative   Not on file   Social Drivers of Health   Financial Resource Strain: Low Risk  (09/26/2020)   Received from [facility], [facility]   Overall Financial Resource Strain (CARDIA)    Difficulty of Paying Living Expenses: Not hard at all  Food Insecurity: No Food Insecurity (09/26/2020)   Received from [facility], [facility]   Hunger Vital Sign    Worried About Running Out of Food in the Last Year: Never true    Ran Out of Food in the Last Year: Never true  Transportation Needs: No Transportation Needs (09/26/2020)   Received from [facility], [facility]   [facility] - Transportation    Lack of Transportation (Medical): No    Lack of Transportation (Non-Medical): No  Physical Activity: Inactive (09/26/2020)   Received from [facility], [facility]   Exercise Vital Sign    Days of Exercise per Week: 0 days    Minutes of Exercise per Session: 0 min  Stress: No Stress Concern Present (09/26/2020)   Received from [facility], [facility] - Occupational Stress Questionnaire    Feeling of Stress : Not at all  Social Connections: Socially Integrated (09/26/2020)   Received from [facility], [facility]   Social Connection and Isolation Panel [NHANES]    Frequency of Communication with Friends and Family: More than three times a week    Frequency of Social Gatherings with Friends and Family: Three times a week    Attends Religious Services: 1 to 4 times per year    Active Member of Clubs or Organizations: Yes    Attends Banker Meetings: 1 to 4 times per year    Marital Status: Married  Catering manager Violence: Not At Risk (09/26/2020)   Received from [facility], [facility]   Humiliation, Afraid, Rape, and Kick questionnaire    Fear of Current or Ex-Partner: No    Emotionally Abused: No  Physically Abused: No    Sexually Abused: No    Family History:   Family History  Problem Relation Age of Onset   Hyperlipidemia Mother    Hypertension Mother    Diabetes Mother    Heart attack  Brother    Heart disease Brother    Lung disease Father      ROS:  Please see the history of present illness.   Physical Exam/Data:   Vitals:   08/11/23 2306 08/12/23 0600 08/12/23 0602 08/12/23 0959  BP:  (!) 146/79 (!) 140/86   Pulse:  77 67   Resp:  18 12   Temp:  97.6 F (36.4 C) (!) 97.5 F (36.4 C) 97.7 F (36.5 C)  TempSrc:      SpO2: 98% 95% 98%   Weight:      Height:       No intake or output data in the 24 hours ending 08/12/23 1122    08/11/2023    6:33 PM 08/01/2023    6:20 AM 07/25/2023   11:12 AM  Last 3 Weights  Weight (lbs) 223 lb 1.7 oz 223 lb 230 lb  Weight (kg) 101.2 kg 101.152 kg 104.327 kg     Body mass index is 27.89 kg/m.  General: 68 y.o. African-American male resting comfortably in no acute distress. HEENT: Normocephalic and atraumatic. Sclera clear.  Neck: Supple. JVD elevated. Heart: RRR. III/VI systolic murmur. Lungs: No increased work of breathing. Clear to ausculation bilaterally. No wheezes, rhonchi, or rales.  Abdomen: Soft, non-distended, and non-tender to palpation.  Extremities: 3+ pitting edema of bilateral lower extremities extending up to his thighs.  Skin: Warm and dry. Neuro: Alert and oriented x3. No focal deficits. Psych: Normal affect. Responds appropriately.   EKG:  The EKG was personally reviewed and demonstrates:  Normal sinus rhythm with inferolateral T wave inversions Telemetry:  Telemetry was personally reviewed and demonstrates:  No currently on telemetry.  Relevant CV Studies:  Right/ Left Cardiac Catheterization 08/01/2023: 1.  Severe native vessel disease:  Ost LAD to Prox LAD 100% CTO.   Ost RCA to Prox RCA is 100% CTO.  Mid Cx is 99% stenosed.2.  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  SVG-D1, SVG-OM2, SVG-rPDA: Origin to Prox Graft lesion is 100% stenosed.T 3.  Widely patent LIMA to LAD.  4.  Fick CO 6.9 L/min and CI 3.0 L/min/m: RAP 16 mmHg, RV P-EDP 74/1-18 mmHg; PAP-remained 66/21-39 mmHg, PCWP 28  mm with V wave up to 33 mm.  PVR 1.6 Woods units.    Recommendation: Continue evaluation for potential aortic valve intervention.     Laboratory Data:  High Sensitivity Troponin:  No results for input(s): "TROPONINIHS" in the last 720 hours.   Chemistry Recent Labs  Lab 08/11/23 1933  NA 141  K 5.0  CL 108  CO2 21*  GLUCOSE 112*  BUN 128*  CREATININE 6.17*  CALCIUM 7.7*  GFRNONAA 9*  ANIONGAP 12    No results for input(s): "PROT", "ALBUMIN", "AST", "ALT", "ALKPHOS", "BILITOT" in the last 168 hours. Lipids No results for input(s): "CHOL", "TRIG", "HDL", "LABVLDL", "LDLCALC", "CHOLHDL" in the last 168 hours.  Hematology Recent Labs  Lab 08/11/23 1933  WBC 5.7  RBC 3.28*  HGB 9.8*  HCT 30.9*  MCV 94.2  MCH 29.9  MCHC 31.7  RDW 14.9  PLT 115*   Thyroid No results for input(s): "TSH", "FREET4" in the last 168 hours.  BNP Recent Labs  Lab 08/11/23  1933  BNP 865.0*    DDimer No results for input(s): "DDIMER" in the last 168 hours.   Radiology/Studies:  DG Chest 2 View Result Date: 08/11/2023 CLINICAL DATA:  Dyspnea EXAM: CHEST - 2 VIEW COMPARISON:  11/06/2020 FINDINGS: Lung volumes are small, but are symmetric and are clear. No pneumothorax or pleural effusion. Coronary artery bypass grafting has been performed. Cardiac size is at the upper limits of normal. Rim vascularity is normal. No acute bone abnormality. IMPRESSION: 1. Pulmonary hypoinflation.  Borderline cardiomegaly. Electronically Signed   By: Helyn Numbers M.D.   On: 08/11/2023 21:44     Assessment and Plan:   Acute on Chronic HFrEF Echo in 06/2023 at [facility] during admission for CHF exacerbation showed EF 30-35% with akinesis of the inferior and inferolateral segments, grade 2 diastolic dysfunction, low flow low gradient severe aortic stenosis, mild mitral regurgitation, and severe pulmonary hypertension. He now presents with worsening shortness of breath and lower extremity edema. BNP 865. Chest  x-ray showed pulmonary hypoinflation and borderline cardiomegaly.  - Volume overloaded on exam. - Repeat Echo has been ordered. However, will cancel this given he just recently had one. - Will defer diuresis to Nephrology given AKI superimposed on CKD stage IV. They gave him a dose of IV Bumex 2mg  and now have started IV Lasix 120mg  twice daily.  - Continue Coreg 3.125mg  twice daily. => Will defer titration of antihypertensives to nephrology as the goal may be to maintain higher blood pressures and ensure renal perfusion. - Continue Imdur 120mg  daily. - No ACEi/ARB/ ARNI, MRA, or SGLT2 inhibitor due to renal function.  - Continue to monitor daily weights, strict I/Os, and renal function.  CAD S/p remote CABG x4 in 2003. LHC in 07/2023 showed patent LIMA to LAD but all 3 veins grafts were occluded. - No chest pain.  - Continue Coreg 3.125mg  twice daily and Imdur 120mg  daily.  - Continue with DAPT with Aspirin and Plavix. - Continue statin.  Low Flow Low Gradient Severe Aortic Stenosis Noted on Echo in 06/2023 at [facility]. He was seen by [facility] and underwent [facility] in 07/2023 with plans to complete TAVR protocal CTs after renal recovery from cath. - Continue with outpatient work-up if renal function allows.   PAD S/p angioplasty of left tibioperoneal trunk and left popliteal artery in 09/2022. - Continue DAPT with Aspirin and Plavix.  - Continue statin.  Hypertension BP mildly elevated. - Continue home medications: Amlodipine 5mg  daily, Coreg 3.125mg  twice daily, Imdur 120mg  daily, Clonidine 0.3mg  daily. - Ideally he would be on Hydralazine with his HFrEF rather than Clonidine but he is on a higher dose of Clonidine so this would need to be weaned off if this switch was made.   Hyperlipidemia - Continue Lipitor 20mg  daily.  Type 2 Diabetes Mellitus  AKI on CKD Stage IV Creatinine 6.17 on admission. Baseline creatinine 3.5 in 05/2023 but 4.71 in 07/2023.  - Continue  to avoid Nephrotoxic agents.  - Patient states he would not want dialysis in the future. - Management per Nephrology.   Risk Assessment/Risk Scores:   New York Heart Association (NYHA) Functional Class NYHA Class III  For questions or updates, please contact [facility] Please consult [email or web address] for contact info under    Signed, Corrin Parker, PA-C  08/12/2023 11:22 AM   ATTENDING ATTESTATION  I have seen, examined and evaluated the patient this afternoon in consultation along with Tresa Endo, PA.  After reviewing all the available  data and chart, we discussed the patients laboratory, study & physical findings as well as symptoms in detail.  I agree with her findings, examination as well as impression recommendations as per our discussion.    Attending adjustments noted in italics.   Patient with a significant cardiac valvular/CHF history as reviewed above who presents here with significant wall volume overload and under diuresis.  At this point he is pretty significant CKD 4-CKD 5 and having minimal urine output.  Certainly being consulted on by nephrology.  Currently trying to diurese but understands he may be a necessity to consider dialysis.  He presents with volume overload with significant edema and dyspnea.  He is currently being seen and managed by the nephrology team for diuresis and worsening renal failure.  Unfortunately he is starting to show signs of potential symptoms from aortic stenosis undergoing evaluation for TAVR.  Tramadol normal volume.  Continue IV diuresis management per nephrology.  Will follow-up with no active additional recommendations at this point. Marykay Lex, MD, MS Bryan Lemma, M.D., M.S. Interventional Cardiologist  [facility]  Pager # [phone] Phone # [phone] [address] [place], [place] [address]

## 2023-08-12 NOTE — ED Notes (Signed)
 Awaiting bumex from main pharmacy

## 2023-08-13 DIAGNOSIS — N179 Acute kidney failure, unspecified: Secondary | ICD-10-CM | POA: Diagnosis not present

## 2023-08-13 DIAGNOSIS — I5020 Unspecified systolic (congestive) heart failure: Secondary | ICD-10-CM | POA: Diagnosis not present

## 2023-08-13 DIAGNOSIS — Z7189 Other specified counseling: Secondary | ICD-10-CM

## 2023-08-13 DIAGNOSIS — Z515 Encounter for palliative care: Secondary | ICD-10-CM

## 2023-08-13 LAB — GLUCOSE, CAPILLARY
Glucose-Capillary: 126 mg/dL — ABNORMAL HIGH (ref 70–99)
Glucose-Capillary: 122 mg/dL — ABNORMAL HIGH (ref 70–99)
Glucose-Capillary: 114 mg/dL — ABNORMAL HIGH (ref 70–99)
Glucose-Capillary: 126 mg/dL — ABNORMAL HIGH (ref 70–99)
Glucose-Capillary: 175 mg/dL — ABNORMAL HIGH (ref 70–99)

## 2023-08-13 LAB — RENAL FUNCTION PANEL
Albumin: 3.3 g/dL — ABNORMAL LOW (ref 3.5–5.0)
Anion gap: 11 (ref 5–15)
BUN: 130 mg/dL — ABNORMAL HIGH (ref 8–23)
CO2: 19 mmol/L — ABNORMAL LOW (ref 22–32)
Calcium: 7.7 mg/dL — ABNORMAL LOW (ref 8.9–10.3)
Chloride: 110 mmol/L (ref 98–111)
Creatinine, Ser: 5.95 mg/dL — ABNORMAL HIGH (ref 0.61–1.24)
GFR, Estimated: 10 mL/min — ABNORMAL LOW (ref >60)
Glucose, Bld: 127 mg/dL — ABNORMAL HIGH (ref 70–99)
Phosphorus: 6.2 mg/dL — ABNORMAL HIGH (ref 2.5–4.6)
Potassium: 5.2 mmol/L — ABNORMAL HIGH (ref 3.5–5.1)
Sodium: 140 mmol/L (ref 135–145)

## 2023-08-13 LAB — HIV ANTIBODY (ROUTINE TESTING W REFLEX): HIV Screen 4th Generation wRfx: NONREACTIVE

## 2023-08-13 LAB — FERRITIN: Ferritin: 140 ng/mL (ref 24–336)

## 2023-08-13 LAB — CBC
HCT: 26.6 % — ABNORMAL LOW (ref 39.0–52.0)
Hemoglobin: 8.5 g/dL — ABNORMAL LOW (ref 13.0–17.0)
MCH: 29.5 pg (ref 26.0–34.0)
MCHC: 32 g/dL (ref 30.0–36.0)
MCV: 92.4 fL (ref 80.0–100.0)
Platelets: 108 10*3/uL — ABNORMAL LOW (ref 150–400)
RBC: 2.88 MIL/uL — ABNORMAL LOW (ref 4.22–5.81)
RDW: 14.9 % (ref 11.5–15.5)
WBC: 5.3 10*3/uL (ref 4.0–10.5)
nRBC: 0 % (ref 0.0–0.2)

## 2023-08-13 LAB — IRON AND TIBC
Iron: 50 ug/dL (ref 45–182)
Saturation Ratios: 16 % — ABNORMAL LOW (ref 17.9–39.5)
TIBC: 308 ug/dL (ref 250–450)
UIBC: 258 ug/dL

## 2023-08-13 MED ORDER — CYCLOBENZAPRINE HCL 5 MG PO TABS
5.0000 mg | ORAL_TABLET | Freq: Three times a day (TID) | ORAL | Status: DC | PRN
  Administered 2023-08-19: 5 mg via ORAL
  Filled 2023-08-13: qty 1

## 2023-08-13 MED ORDER — DARBEPOETIN ALFA 60 MCG/0.3ML IJ SOSY
60.0000 ug | PREFILLED_SYRINGE | Freq: Once | INTRAMUSCULAR | Status: AC
  Administered 2023-08-13: 60 ug via SUBCUTANEOUS
  Filled 2023-08-13: qty 0.3

## 2023-08-13 MED ORDER — ISOSORBIDE MONONITRATE ER 60 MG PO TB24
60.0000 mg | ORAL_TABLET | Freq: Every day | ORAL | Status: DC
  Administered 2023-08-14 – 2023-08-16 (×3): 60 mg via ORAL
  Filled 2023-08-13 (×3): qty 1

## 2023-08-13 MED ORDER — CLONIDINE HCL 0.2 MG PO TABS
0.2000 mg | ORAL_TABLET | Freq: Every day | ORAL | Status: DC
  Administered 2023-08-13 – 2023-08-15 (×3): 0.2 mg via ORAL
  Filled 2023-08-13 (×3): qty 1

## 2023-08-13 MED ORDER — IRON SUCROSE 200 MG IVPB - SIMPLE MED
200.0000 mg | Status: AC
  Administered 2023-08-13 – 2023-08-15 (×3): 200 mg via INTRAVENOUS
  Filled 2023-08-13: qty 200
  Filled 2023-08-13: qty 110
  Filled 2023-08-13: qty 200

## 2023-08-13 MED ORDER — METOLAZONE 5 MG PO TABS
5.0000 mg | ORAL_TABLET | Freq: Every day | ORAL | Status: DC
  Administered 2023-08-13 – 2023-08-19 (×7): 5 mg via ORAL
  Filled 2023-08-13 (×7): qty 1

## 2023-08-13 MED ORDER — FUROSEMIDE 10 MG/ML IJ SOLN
120.0000 mg | Freq: Two times a day (BID) | INTRAVENOUS | Status: DC
  Filled 2023-08-13: qty 120

## 2023-08-13 MED ORDER — FUROSEMIDE 10 MG/ML IJ SOLN
120.0000 mg | Freq: Three times a day (TID) | INTRAVENOUS | Status: DC
  Administered 2023-08-13 – 2023-08-20 (×20): 120 mg via INTRAVENOUS
  Filled 2023-08-13 (×2): qty 2
  Filled 2023-08-13 (×2): qty 120
  Filled 2023-08-13: qty 12
  Filled 2023-08-13: qty 120
  Filled 2023-08-13: qty 10
  Filled 2023-08-13: qty 12
  Filled 2023-08-13: qty 10
  Filled 2023-08-13 (×2): qty 12
  Filled 2023-08-13 (×2): qty 10
  Filled 2023-08-13: qty 120
  Filled 2023-08-13: qty 2
  Filled 2023-08-13 (×3): qty 10
  Filled 2023-08-13: qty 120
  Filled 2023-08-13 (×2): qty 10
  Filled 2023-08-13: qty 12
  Filled 2023-08-13: qty 10

## 2023-08-13 MED ORDER — SEVELAMER CARBONATE 800 MG PO TABS
800.0000 mg | ORAL_TABLET | Freq: Three times a day (TID) | ORAL | Status: DC
  Administered 2023-08-13 – 2023-08-25 (×31): 800 mg via ORAL
  Filled 2023-08-13 (×30): qty 1

## 2023-08-13 MED ORDER — GABAPENTIN 300 MG PO CAPS
300.0000 mg | ORAL_CAPSULE | Freq: Two times a day (BID) | ORAL | Status: DC
  Administered 2023-08-13 – 2023-08-14 (×2): 300 mg via ORAL
  Filled 2023-08-13 (×2): qty 1

## 2023-08-13 NOTE — Progress Notes (Addendum)
 PROGRESS NOTE    Phillip [name]  ZOX:[id number] DOB: 07-Jul-1955 DOA: 08/11/2023 PCP: Gordan Payment., MD  67/M with hypertension, type 2 diabetes mellitus, CKD 5, CAD/CABG, chronic systolic CHF, hypothyroidism presented to the ED with worsening shortness of breath and swelling, with orthopnea.  Followed by nephrology in [facility], has been reluctant to consider dialysis. -Also recently underwent right and left heart cath 2/28 for TAVR workup. -In the ED noted to be significantly volume overloaded, hemoglobin 9.8, creatinine 6.1, BNP 685, chest x-ray with hypoinflation   Subjective: Breathing better, denies nausea vomiting or anorexia  Assessment and Plan:  AKI on CKD 5, progressive -Baseline creatinine around 3-4, has been up to the 5 range recently, nephrology consulting, considered ESRD at this time -Remains on high-dose diuretics with poor response -No indication for urgent dialysis but anticipate need this admission, palliative consulted for goals of care -Decrease gabapentin and Flexeril dose  Acute on chronic systolic CHF -Echo 1/25 at [facility] with EF 30-35% with wall motion abnormality, grade 2 DD, low-flow low gradient severe aortic stenosis, severe pulmonary hypertension -Complicated by CKD 5 -Following on high-dose IV Lasix, is near ESRD, patient is reluctant -Continue Coreg, Imdur  Severe aortic stenosis, low-flow low gradient -Noted on echo 1/25, seen by structural heart team and underwent right and left heart cath with plan to complete TAVR protocol following renal recovery -Follow-up with structural heart team   CAD Peripheral vascular disease Patient with prior history history of four-vessel CABG back in 2003.  Patient had left heart cath 07/2023 which showed patent LIMA to LAD but all 3 vein grafts occluded. -Continue aspirin, Plavix, beta-blocker, statin   Essential hypertension Blood pressures currently maintained 138/76 to 146/81 -Continue  amlodipine, Coreg, clonidine, isosorbide mononitrate   Aortic stenosis Plan had been for further workup for TAVR in the outpatient setting    Diabetes mellitus type 2, with long-term use of insulin Last available hemoglobin A1c was 7.2 when last checked on 12/13 -Continue home long-acting insulin -SSI   Pancytopenia Chronic.,  Stable monitor   Hyperlipidemia -Continue atorvastatin   Hypothyroidism TSH was noted to be 3.99 when checked on 07/01/2023. -Add on TSH -Continue levothyroxine   BPH -Continue Flomax  DVT prophylaxis: Heparin  Advance Care Planning:   Code Status: Full Code   Consults: Cardiology and nephrology   Family Communication: None present Disposition: Pending stabilization of above issues   Consultants: Cards, nephrology   Procedures:   Antimicrobials:    Objective: Vitals:   08/13/23 0449 08/13/23 0549 08/13/23 0732 08/13/23 1113  BP: 137/71 114/69 130/70 122/69  Pulse: 72 67 68 66  Resp: 18 15 16 16   Temp: 98.9 F (37.2 C) 98 F (36.7 C) 98.3 F (36.8 C) 98.2 F (36.8 C)  TempSrc: Oral Oral Oral Oral  SpO2: 91% 91% 90% 93%  Weight:      Height:        Intake/Output Summary (Last 24 hours) at 08/13/2023 1240 Last data filed at 08/13/2023 1213 Gross per 24 hour  Intake 350.88 ml  Output 1200 ml  Net -849.12 ml   Filed Weights   08/11/23 1833 08/13/23 0118  Weight: 101.2 kg 104.4 kg    Examination:  General exam: Chronically ill male sitting up in bed, AAOx3 HEENT: Positive JVD CVS: S1-S2, regular rhythm, systolic murmur Lungs: Few basilar Rales Abdomen: Soft, nontender, bowel sounds present Extremities: 2-3+ edema  Psychiatry: Flat affect    Data Reviewed:   CBC: Recent Labs  Lab 08/11/23 1933 08/13/23 0224  WBC 5.7 5.3  NEUTROABS 4.4  --   HGB 9.8* 8.5*  HCT 30.9* 26.6*  MCV 94.2 92.4  PLT 115* 108*   Basic Metabolic Panel: Recent Labs  Lab 08/11/23 1933 08/13/23 0224  NA 141 140  K 5.0 5.2*  CL 108  110  CO2 21* 19*  GLUCOSE 112* 127*  BUN 128* 130*  CREATININE 6.17* 5.95*  CALCIUM 7.7* 7.7*  PHOS  --  6.2*   GFR: Estimated Creatinine Clearance: 15.5 mL/min (A) (by C-G formula based on SCr of 5.95 mg/dL (H)). Liver Function Tests: Recent Labs  Lab 08/13/23 0224  ALBUMIN 3.3*   No results for input(s): "LIPASE", "AMYLASE" in the last 168 hours. No results for input(s): "AMMONIA" in the last 168 hours. Coagulation Profile: No results for input(s): "INR", "PROTIME" in the last 168 hours. Cardiac Enzymes: No results for input(s): "CKTOTAL", "CKMB", "CKMBINDEX", "TROPONINI" in the last 168 hours. BNP (last 3 results) Recent Labs    07/14/23 1412  PROBNP 11,269*   HbA1C: No results for input(s): "HGBA1C" in the last 72 hours. CBG: Recent Labs  Lab 08/12/23 1254 08/12/23 1721 08/13/23 0119 08/13/23 0609 08/13/23 1115  GLUCAP 192* 162* 126* 122* 114*   Lipid Profile: No results for input(s): "CHOL", "HDL", "LDLCALC", "TRIG", "CHOLHDL", "LDLDIRECT" in the last 72 hours. Thyroid Function Tests: Recent Labs    08/11/23 1933  TSH 7.948*   Anemia Panel: Recent Labs    08/13/23 0224  FERRITIN 140  TIBC 308  IRON 50   Urine analysis:    Component Value Date/Time   COLORURINE YELLOW 11/07/2020 0744   APPEARANCEUR CLEAR 11/07/2020 0744   LABSPEC 1.011 11/07/2020 0744   PHURINE 5.0 11/07/2020 0744   GLUCOSEU 50 (A) 11/07/2020 0744   HGBUR SMALL (A) 11/07/2020 0744   BILIRUBINUR NEGATIVE 11/07/2020 0744   KETONESUR NEGATIVE 11/07/2020 0744   PROTEINUR 100 (A) 11/07/2020 0744   NITRITE NEGATIVE 11/07/2020 0744   LEUKOCYTESUR NEGATIVE 11/07/2020 0744   Sepsis Labs: @LABRCNTIP (procalcitonin:4,lacticidven:4)  )No results found for this or any previous visit (from the past 240 hours).   Radiology Studies: DG Chest 2 View Result Date: 08/11/2023 CLINICAL DATA:  Dyspnea EXAM: CHEST - 2 VIEW COMPARISON:  11/06/2020 FINDINGS: Lung volumes are small, but are  symmetric and are clear. No pneumothorax or pleural effusion. Coronary artery bypass grafting has been performed. Cardiac size is at the upper limits of normal. Rim vascularity is normal. No acute bone abnormality. IMPRESSION: 1. Pulmonary hypoinflation.  Borderline cardiomegaly. Electronically Signed   By: Helyn Numbers M.D.   On: 08/11/2023 21:44     Scheduled Meds:  amLODipine  5 mg Oral Daily   aspirin EC  81 mg Oral Daily   atorvastatin  20 mg Oral Daily   carvedilol  3.125 mg Oral BID   cloNIDine  0.2 mg Oral QHS   clopidogrel  75 mg Oral Daily   cyanocobalamin  500 mcg Oral Daily   darbepoetin (ARANESP) injection - NON-DIALYSIS  60 mcg Subcutaneous Once   gabapentin  600 mg Oral BID   heparin injection (subcutaneous)  5,000 Units Subcutaneous Q8H   insulin aspart  0-6 Units Subcutaneous TID WC   insulin glargine  7 Units Subcutaneous QHS   [START ON 08/14/2023] isosorbide mononitrate  60 mg Oral Daily   latanoprost  1 drop Both Eyes QHS   levothyroxine  50 mcg Oral Daily   metolazone  5 mg Oral Daily  sevelamer carbonate  800 mg Oral TID WC   tamsulosin  0.4 mg Oral QPC breakfast   Continuous Infusions:  furosemide     iron sucrose       LOS: 1 day    Time spent:    Zannie Cove, MD [facility]   08/13/2023, 12:40 PM

## 2023-08-13 NOTE — Progress Notes (Signed)
    Cardiology is standing by - major issue at this point is progressive renal failure and volume overload -- attempting diuresis as he is opposed to HD.  Will defer diuresis & BP management to Nephrology.    Will follow peripherally.  Bryan Lemma, MD

## 2023-08-13 NOTE — Plan of Care (Signed)

## 2023-08-13 NOTE — Progress Notes (Signed)
 Subjective:  Phillip [name] is doing well this morning. He is pleased that he can move his legs with more ease now that the swelling is starting to improve some. His breathing is fine. He does state that his RLE edema improvement typically lags behind that of the LLE. Of note, further discussion regarding the potential need for hemodialysis revealed that Phillip [name] may be open to hemodialysis eventually if medical therapies failed, but was unclear on expected timeline and not open to immediate hemodialysis.   Objective: Vital signs in last 24 hours: Vitals:   08/13/23 0118 08/13/23 0449 08/13/23 0549 08/13/23 0732  BP: (!) 142/79 137/71 114/69 130/70  Pulse: 79 72 67 68  Resp: 19 18 15 16   Temp: 98.2 F (36.8 C) 98.9 F (37.2 C) 98 F (36.7 C) 98.3 F (36.8 C)  TempSrc: Oral Oral Oral Oral  SpO2: 98% 91% 91% 90%  Weight: 104.4 kg     Height:       Weight change: 3.218 kg  Intake/Output Summary (Last 24 hours) at 08/13/2023 0753 Last data filed at 08/13/2023 0453 Gross per 24 hour  Intake 350.88 ml  Output 600 ml  Net -249.12 ml   Assessment/ Plan: Pt is a 68 y.o. yo male who was admitted on 08/11/2023 with volume overload/CHF exacerbation and AKI on CKD stage 5.   Labs reviewed, notable for K 5.2, bicarbonate 19, BUN/Cr 130/5.95 (128/6.17), phosphorus 6.2, albumin 3.3, GFR 10.  Documented UOP of 600 mL 03/11.  AKI on CKD stage 5: Suspect 2/2 fluid overload/CHF exacerbation. Concern that he has now progressed in stage of CKD as well and will not see significant renal recovery. Very minimal improvement in function with diuresis yesterday.Increase IV furosemide 120 mg to TID and start metolazone 5 mg daily, trend daily renal function panel. HFrEF: Per cardiology. Will adjust lasix as needed to optimize volume output. HTN/Volume: Low sodium diet, diuresis as above. Continue anti-hypertensives; titrate as indicated for adequate BP control. Strict I's & O's, daily weights. Anemia of CKD: Drop in all  cell counts which I suspect is dilutional. Iron saturation decreased at 16, otherwise WNL. CKD-MBD: Hyperphosphatemic at 6.2. PTH level pending. Will adjust diet order to renal diet, consider starting sevelamer 800 mg TID with meals. Trend levels with daily labs.  Labs: Basic Metabolic Panel: Recent Labs  Lab 08/11/23 1933 08/13/23 0224  NA 141 140  K 5.0 5.2*  CL 108 110  CO2 21* 19*  GLUCOSE 112* 127*  BUN 128* 130*  CREATININE 6.17* 5.95*  CALCIUM 7.7* 7.7*  PHOS  --  6.2*   Liver Function Tests: Recent Labs  Lab 08/13/23 0224  ALBUMIN 3.3*   No results for input(s): "LIPASE", "AMYLASE" in the last 168 hours. No results for input(s): "AMMONIA" in the last 168 hours. CBC: Recent Labs  Lab 08/11/23 1933 08/13/23 0224  WBC 5.7 5.3  NEUTROABS 4.4  --   HGB 9.8* 8.5*  HCT 30.9* 26.6*  MCV 94.2 92.4  PLT 115* 108*   Cardiac Enzymes: No results for input(s): "CKTOTAL", "CKMB", "CKMBINDEX", "TROPONINI" in the last 168 hours. CBG: Recent Labs  Lab 08/12/23 1254 08/12/23 1721 08/13/23 0119 08/13/23 0609  GLUCAP 192* 162* 126* 122*   Iron Studies:  Recent Labs    08/13/23 0224  IRON 50  TIBC 308  FERRITIN 140   Studies/Results: DG Chest 2 View Result Date: 08/11/2023 CLINICAL DATA:  Dyspnea EXAM: CHEST - 2 VIEW COMPARISON:  11/06/2020 FINDINGS: Lung volumes are small, but are  symmetric and are clear. No pneumothorax or pleural effusion. Coronary artery bypass grafting has been performed. Cardiac size is at the upper limits of normal. Rim vascularity is normal. No acute bone abnormality. IMPRESSION: 1. Pulmonary hypoinflation.  Borderline cardiomegaly. Electronically Signed   By: Helyn Numbers M.D.   On: 08/11/2023 21:44   Medications: Infusions:  furosemide 62 mL/hr at 08/13/23 0353   Scheduled Medications:  amLODipine  5 mg Oral Daily   aspirin EC  81 mg Oral Daily   atorvastatin  20 mg Oral Daily   carvedilol  3.125 mg Oral BID   cloNIDine  0.3 mg  Oral QHS   clopidogrel  75 mg Oral Daily   cyanocobalamin  500 mcg Oral Daily   gabapentin  600 mg Oral BID   heparin injection (subcutaneous)  5,000 Units Subcutaneous Q8H   insulin aspart  0-6 Units Subcutaneous TID WC   insulin glargine  7 Units Subcutaneous QHS   isosorbide mononitrate  120 mg Oral Daily   latanoprost  1 drop Both Eyes QHS   levothyroxine  50 mcg Oral Daily   tamsulosin  0.4 mg Oral QPC breakfast   I have reviewed scheduled and prn medications.  Physical Exam: Constitutional:Well appearing. In no acute distress. Cardio:Regular rate and rhythm. JVD present. He has a murmur. Pulm:Minimal basilar crackles on L Normal work of breathing on room air. Abdomen:Soft, nontender, nondistended. MSK:2+ pitting edema to distal thigh on LLE, 2+ pitting edema to mid-thigh on RLE; both extremities also with dependent edema. Skin:Warm and dry. Neuro:Alert and oriented x3. No focal deficit noted. Mild asterixis.  Psych:Pleasant mood and affect. Dialysis Access: None     [name], DO Internal Medicine PGY-3 08/13/2023,7:53 AM  LOS: 1 day

## 2023-08-13 NOTE — Consult Note (Signed)
 Palliative Care Consult Note                                  Date: 08/13/2023   Patient Name: Phillip [name]  DOB: 29-Dec-1955  MRN: [id number]  Age / Sex: 68 y.o., male  PCP: Phillip Payment., MD Referring Physician: Zannie Cove, MD  Reason for Consultation: Establishing goals of care  HPI/Patient Profile: 68 y.o. male  with past medical history of CKD stage V, CAD with CABG in 2003, aortic stenosis, chronic HFrEF, type 2 diabetes, peripheral vascular disease, hypertension, and hypothyroidism.  He presented to the ED on 08/11/2023 with worsening shortness of breath, leg swelling, and orthopnea.     Subjective:   Extensive chart review has been completed including labs, vital signs, imaging, progress/consult notes, orders, medications and available advance directive documents.   I met with patient at bedside to discuss diagnosis, prognosis, and GOC. He is OOB to the recliner. He has no acute complaints, and reports feeling improved.   I introduced Palliative Medicine as specialized medical care for people living with serious illness. It focuses on providing relief from the symptoms and stress of a serious illness.   Created space and opportunity for patient to express thoughts and feelings regarding current medical situation. Values and goals of care were attempted to be elicited.  Life Review: Patient is married to his wife/Phillip [name] for 45 years.  He has 1 daughter who lives in [place]. He had another daughter who is deceased. He has multiple grandchildren. 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   Functional Status: Patient lives at home with his wife. Prior to admission, he was ambulatory and independent with all ADLs. However, he is no longer able to work in his yard due to worsening fatigue and shortness of breath. He shares that this has been difficult as he has always been active and  previously loved working in his yard.  GOC Discussion: We discussed patient's current medical situation and what it means in the larger context of his ongoing co-morbidities.    We reviewed his multiple medical issues including CKD 5, heart failure with reduced EF, CAD, and aortic stenosis. We discussed the natural trajectory of chronic illness as progressive and life-limiting.    Discussed the "what ifs" and encouraged patient to consider what medical interventions he would want in the event his condition were to deteriorate, keeping in mind the concept of quality of life.    Discussed code status. I encouraged consideration of DNR/DNI status and provided education on evidence-based poor outcomes in similar hospitalized patients, as the cause of cardiac arrest would likely be associated with advanced illness rather than an acute reversible condition.  Patient expresses his desire to remain full code for now.   We discussed patient's progressive kidney failure and potential need for hemodialysis. At this time, he is unsure whether or not he would be willing to do dialysis. With further discussion, he seems to indicate he would be open to dialysis if necessary to continue living. He states that his wife and daughter will likely encourage him "to be  around for the grandchildren". He does express concern about quality of life, and shares that he has a good friend on dialysis who looks "terrible".   Discussed the importance of continued conversation with the medical providers regarding overall plan of care and treatment options.    Review of Systems  Constitutional:  Positive for fatigue.    Objective:   Primary Diagnoses: Present on Admission:  Heart failure with reduced ejection fraction (HCC)  Acute kidney injury superimposed on chronic kidney disease (HCC)  CAD (coronary artery disease)  Hypothyroidism  Hyperlipidemia  Pancytopenia (HCC)  Essential hypertension   Physical  Exam Vitals reviewed.  Constitutional:      General: He is not in acute distress.    Comments: Chronically ill-appearing  Pulmonary:     Effort: Pulmonary effort is normal.  Neurological:     Mental Status: He is alert and oriented to person, place, and time.     Palliative Assessment/Data: PPS 50%     Assessment & Plan:   SUMMARY OF RECOMMENDATIONS   Continue current supportive interventions At this time, patient is unsure whether or not he would be willing to do dialysis if there was an urgent indication Ongoing GOC discussions PMT will follow-up tomorrow   Primary Decision Maker: PATIENT Next of kin is his wife/Phillip [name]  Code Status/Advance Care Planning: Full code  Symptom Management:  Per attending  Prognosis:  Unable to determine  Discharge Planning:  To Be Determined    Thank you for allowing [place] to participate in the care of Phillip [name]   Time Total: 78 minutes  Detailed review of medical records (labs, imaging, vital signs), medically appropriate exam, discussed with treatment team, counseling and education to patient, family, & staff, documenting clinical information, coordination of care.   Signed by: Sherlean Foot, NP Palliative Medicine Team  Team Phone # [phone]  For individual providers, please see AMION

## 2023-08-13 NOTE — TOC Initial Note (Signed)
 Transition of Care [facility]) - Initial/Assessment Note    Patient Details  Name: Phillip [name] MRN: [id number] Date of Birth: 1955/12/30  Transition of Care [facility]) CM/SW Contact:    Leone Haven, RN Phone Number: 08/13/2023, 4:30 PM  Clinical Narrative:                 From home with spouse, has PCP and insurance on file, states has no HH services in place at this time , has a cane at home.  States family member will transport them home at [employer] and family is support system, states gets medications from [facility] in [place].  Pta self ambulatory with cane.  Expected Discharge Plan: Home/Self Care Barriers to Discharge: Continued Medical Work up   Patient Goals and CMS Choice Patient states their goals for this hospitalization and ongoing recovery are:: return home   Choice offered to / list presented to : NA      Expected Discharge Plan and Services   Discharge Planning Services: CM Consult Post Acute Care Choice: NA Living arrangements for the past 2 months: Single Family Home                 DME Arranged: N/A DME Agency: NA       HH Arranged: NA          Prior Living Arrangements/Services Living arrangements for the past 2 months: Single Family Home Lives with:: Spouse Patient language and need for interpreter reviewed:: Yes Do you feel safe going back to the place where you live?: Yes      Need for Family Participation in Patient Care: Yes (Comment) Care giver support system in place?: Yes (comment) Current home services: DME (cane) Criminal Activity/Legal Involvement Pertinent to Current Situation/Hospitalization: No - Comment as needed  Activities of Daily Living   ADL Screening (condition at time of admission) Independently performs ADLs?: Yes (appropriate for developmental age) Is the patient deaf or have difficulty hearing?: No Does the patient have difficulty seeing, even when wearing glasses/contacts?: No Does the patient have difficulty  concentrating, remembering, or making decisions?: No  Permission Sought/Granted Permission sought to share information with : Case Manager                Emotional Assessment   Attitude/Demeanor/Rapport: Engaged Affect (typically observed): Appropriate Orientation: : Oriented to Self, Oriented to Place, Oriented to  Time, Oriented to Situation   Psych Involvement: No (comment)  Admission diagnosis:  AKI (acute kidney injury) (HCC) [N17.9] Heart failure with reduced ejection fraction (HCC) [I50.20] Acute on chronic congestive heart failure, unspecified heart failure type [facility]) [I50.9] Patient Active Problem List   Diagnosis Date Noted   Heart failure with reduced ejection fraction (HCC) 08/12/2023   Pancytopenia (HCC) 08/12/2023   Anasarca 07/01/2023   Fever 11/07/2020   Acute kidney injury superimposed on chronic kidney disease (HCC) 11/06/2020   Asthma 09/27/2020   GERD (gastroesophageal reflux disease) 09/27/2020   Foot ulcer (HCC) 09/27/2020   NSTEMI (non-ST elevated myocardial infarction) (HCC) 09/27/2020   Personal history of tobacco use, presenting hazards to health 09/27/2020   Encounter for long-term (current) use of insulin (HCC) 09/27/2020   Hyperkalemia 05/29/2020   Chronic pain of right hip 03/31/2020   Acquired spondylolisthesis of lumbosacral region 03/20/2020   Lumbar spondylosis 03/20/2020   Osseous and subluxation stenosis of intervertebral foramina of lumbar region 03/20/2020   Primary osteoarthritis of right hip 02/21/2020   ARF (acute renal failure) (HCC) 02/19/2020   Acute  kidney injury superimposed on CKD (HCC) 02/18/2020   Type II diabetes mellitus with neurological manifestations (HCC) 02/18/2020   Right groin pain 02/18/2020   Hyperlipidemia associated with type 2 diabetes mellitus (HCC) 02/18/2020   Coronary artery disease    Diabetes mellitus without complication (HCC)    Hypertension associated with diabetes (HCC)    Kidney insufficiency     Peripheral vascular disease (HCC)    Aortic stenosis, moderate 10/15/2019   Primary osteoarthritis involving multiple joints 10/06/2019   Anemia of chronic disease 08/18/2019   Anemia, chronic disease 08/18/2019   Vitamin B12 deficiency 06/16/2019   Moderate aortic regurgitation 05/12/2019   Mitral regurgitation 05/12/2019   Hypoglycemia 05/07/2019   Regional wall motion abnormality of heart 05/07/2019   Polyneuropathy in diabetes (HCC) 05/07/2019   Non-compliance 05/05/2019   Chronic low back pain without sciatica 12/02/2018   Bilateral lower extremity edema 11/04/2018   Chronic idiopathic constipation 11/02/2018   Insomnia 11/02/2018   Acute kidney injury (HCC) 10/07/2018   CKD (chronic kidney disease) stage 4, GFR 15-29 ml/min (HCC) 10/07/2018   Abdominopelvic abscess (HCC) 09/29/2018   Abscess of appendix 09/22/2018   Appendicitis 09/08/2018   Vitamin D deficiency 05/07/2018   Puncture wound of right hip 04/02/2018   Bradycardia 03/25/2018   Malaise and fatigue 03/25/2018   Subacute osteomyelitis of left foot (HCC) 03/25/2018   Atherosclerosis of native arteries of the extremities with ulceration (HCC) 02/24/2018   Benign hypertension with CKD (chronic kidney disease) stage IV (HCC) 02/24/2018   Essential hypertension 02/24/2018   Screening for colon cancer 02/18/2018   Benign prostatic hyperplasia without lower urinary tract symptoms 01/14/2018   Erectile dysfunction 07/14/2017   Hypothyroidism 11/14/2015   Hypothyroidism (acquired) 11/14/2015   Hematuria 07/27/2015   PVD (peripheral vascular disease) with claudication (HCC) 07/26/2015   Congestive heart failure (HCC) 07/26/2015   Bruit of right carotid artery 07/26/2015   CAD (coronary artery disease) 07/26/2015   Cardiac murmur 07/26/2015   Dysphagia 07/26/2015   High risk medication use 07/26/2015   Hyperlipidemia 07/26/2015   Sleep apnea 07/26/2015   Chronic combined systolic and diastolic CHF (congestive heart  failure) (HCC) 07/26/2015   Heart murmur 07/26/2015   Type 2 diabetes mellitus with stage 4 chronic kidney disease, with long-term current use of insulin (HCC) 07/26/2015   Chronic systolic heart failure (HCC) 07/26/2015   Acute on chronic systolic (congestive) heart failure (HCC) 07/26/2015   PCP:  Gordan Payment., MD Pharmacy:   [facility] [address] [place], [place] - [address] [address] [place] [address] Phone: [phone] Fax: [phone]     Social Drivers of Health (SDOH) Social History: SDOH Screenings   Food Insecurity: No Food Insecurity (08/12/2023)  Housing: Low Risk  (08/12/2023)  Transportation Needs: No Transportation Needs (08/12/2023)  Utilities: Not At Risk (08/12/2023)  Depression (PHQ2-9): Low Risk  (10/22/2018)  Financial Resource Strain: Low Risk  (09/26/2020)   [facility] from [facility], [facility]  Physical Activity: Inactive (09/26/2020)   Received from [facility], [facility]  Social Connections: Moderately Integrated (08/12/2023)  Stress: No Stress Concern Present (09/26/2020)   [facility] from [facility], [facility]  Tobacco Use: Medium Risk (08/11/2023)  Health Literacy: Low Risk  (09/26/2020)   Received from [facility], [facility]   SDOH Interventions:     Readmission Risk Interventions    08/13/2023    4:28 PM  Readmission  Risk Prevention Plan  Transportation Screening Complete  HRI or Home Care Consult Complete  Palliative Care Screening Not Applicable  Medication Review (RN Care Manager) Complete

## 2023-08-14 DIAGNOSIS — I5020 Unspecified systolic (congestive) heart failure: Secondary | ICD-10-CM | POA: Diagnosis not present

## 2023-08-14 LAB — CBC
HCT: 25.1 % — ABNORMAL LOW (ref 39.0–52.0)
Hemoglobin: 8.3 g/dL — ABNORMAL LOW (ref 13.0–17.0)
MCH: 30.3 pg (ref 26.0–34.0)
MCHC: 33.1 g/dL (ref 30.0–36.0)
MCV: 91.6 fL (ref 80.0–100.0)
Platelets: 91 10*3/uL — ABNORMAL LOW (ref 150–400)
RBC: 2.74 MIL/uL — ABNORMAL LOW (ref 4.22–5.81)
RDW: 14.7 % (ref 11.5–15.5)
WBC: 5.2 10*3/uL (ref 4.0–10.5)
nRBC: 0 % (ref 0.0–0.2)

## 2023-08-14 LAB — RENAL FUNCTION PANEL
Albumin: 3.2 g/dL — ABNORMAL LOW (ref 3.5–5.0)
Anion gap: 14 (ref 5–15)
BUN: 128 mg/dL — ABNORMAL HIGH (ref 8–23)
CO2: 19 mmol/L — ABNORMAL LOW (ref 22–32)
Calcium: 7.6 mg/dL — ABNORMAL LOW (ref 8.9–10.3)
Chloride: 106 mmol/L (ref 98–111)
Creatinine, Ser: 6.07 mg/dL — ABNORMAL HIGH (ref 0.61–1.24)
GFR, Estimated: 9 mL/min — ABNORMAL LOW (ref >60)
Glucose, Bld: 114 mg/dL — ABNORMAL HIGH (ref 70–99)
Phosphorus: 6.1 mg/dL — ABNORMAL HIGH (ref 2.5–4.6)
Potassium: 4.8 mmol/L (ref 3.5–5.1)
Sodium: 139 mmol/L (ref 135–145)

## 2023-08-14 LAB — PARATHYROID HORMONE, INTACT (NO CA): PTH: 332 pg/mL — ABNORMAL HIGH (ref 15–65)

## 2023-08-14 LAB — GLUCOSE, CAPILLARY
Glucose-Capillary: 88 mg/dL (ref 70–99)
Glucose-Capillary: 167 mg/dL — ABNORMAL HIGH (ref 70–99)
Glucose-Capillary: 136 mg/dL — ABNORMAL HIGH (ref 70–99)
Glucose-Capillary: 151 mg/dL — ABNORMAL HIGH (ref 70–99)

## 2023-08-14 MED ORDER — GABAPENTIN 300 MG PO CAPS
300.0000 mg | ORAL_CAPSULE | Freq: Every day | ORAL | Status: DC
  Administered 2023-08-15 – 2023-08-24 (×10): 300 mg via ORAL
  Filled 2023-08-14 (×10): qty 1

## 2023-08-14 NOTE — Plan of Care (Signed)
  Problem: Coping: Goal: Ability to adjust to condition or change in health will improve Outcome: Progressing   Problem: Fluid Volume: Goal: Ability to maintain a balanced intake and output will improve Outcome: Progressing   Problem: Health Behavior/Discharge Planning: Goal: Ability to manage health-related needs will improve Outcome: Progressing

## 2023-08-14 NOTE — Progress Notes (Signed)
 Heart Failure Navigator Progress Note  Assessed for Heart & Vascular TOC clinic readiness.  Patient does not meet criteria due to AKI on CKD IV- nearing hemodialysis. No HF TOC. .   Navigator will sign off at this time.   Rhae Hammock, BSN, Scientist, clinical (histocompatibility and immunogenetics) Only

## 2023-08-14 NOTE — Progress Notes (Addendum)
 [name]PHROLOGY PROGRESS NOTE  Assessment/ Plan: Pt is a 68 y.o. yo male with history of hypertension, diabetes, CKD 5 thought to be due to diabetic nephropathy followed by Dr. Janit Pagan at [facility] with a recent baseline creatinine level seems to be around 5, CHF with preserved EF, CAD status post CABG, presented with worsening dyspnea on exertion, lower extremity edema worsening for 3 to 4 weeks.    #Acute kidney injury on CKD 5 with fluid overload/CHF exacerbation: CKD thought to be due to diabetic nephropathy, follows with nephrologist in [facility].  This sounds to me like progressive CKD.  Urine output is increasing with current high-dose IV diuretics and metolazone.  We will continue current diuretics especially since he still has significant lower extremity edema.  He is not interested in dialysis therefore palliative care team was consulted to address goals of care.  Fortunately, he has no uremic symptoms and no urgent need for HD today. Strict ins and out and monitor lab.   #Acute on chronic CHF: Cardiology seeing,  diuretics.   #Hypertension/volume: Continue antihypertensives and volume management with diuretics.  Continue fluid restriction and low-sodium diet.   #Anemia of CKD: Iron saturation 16% therefore ordered IV iron and Aranesp.     #CKD-MBD/hyperphosphatemia: Pending PTH level and started sevelamer as a phosphorus binders.  Subjective: Seen and examined at bedside.  Urine output is recorded around 2.1 L.  Patient reported that some of the urine output are not recorded as well.  He denies nausea, vomiting, dysphagia, chest pain or shortness of breath. Objective Vital signs in last 24 hours: Vitals:   08/14/23 0050 08/14/23 0516 08/14/23 0750 08/14/23 1115  BP: 120/79 131/78 132/69 131/62  Pulse:  71 69 63  Resp: 17 18 14 15   Temp: 97.6 F (36.4 C) 98.2 F (36.8 C) 97.8 F (36.6 C) (!) 97.4 F (36.3 C)  TempSrc: Oral Oral Oral Oral  SpO2: 100% 99%  99% 99%  Weight:  102.6 kg    Height:       Weight change: -1.86 kg  Intake/Output Summary (Last 24 hours) at 08/14/2023 1212 Last data filed at 08/14/2023 0910 Gross per 24 hour  Intake 639.39 ml  Output 2250 ml  Net -1610.61 ml       Labs: RENAL PANEL Recent Labs  Lab 08/11/23 1933 08/13/23 0224 08/14/23 0217  NA 141 140 139  K 5.0 5.2* 4.8  CL 108 110 106  CO2 21* 19* 19*  GLUCOSE 112* 127* 114*  BUN 128* 130* 128*  CREATININE 6.17* 5.95* 6.07*  CALCIUM 7.7* 7.7* 7.6*  PHOS  --  6.2* 6.1*  ALBUMIN  --  3.3* 3.2*    Liver Function Tests: Recent Labs  Lab 08/13/23 0224 08/14/23 0217  ALBUMIN 3.3* 3.2*   No results for input(s): "LIPASE", "AMYLASE" in the last 168 hours. No results for input(s): "AMMONIA" in the last 168 hours. CBC: Recent Labs    08/01/23 1138 08/01/23 1142 08/11/23 1933 08/13/23 0224 08/14/23 0217  HGB 9.2* 9.5* 9.8* 8.5* 8.3*  MCV  --   --  94.2 92.4 91.6  FERRITIN  --   --   --  140  --   TIBC  --   --   --  308  --   IRON  --   --   --  50  --     Cardiac Enzymes: No results for input(s): "CKTOTAL", "CKMB", "CKMBINDEX", "TROPONINI" in the last 168 hours. CBG: Recent  Labs  Lab 08/13/23 1115 08/13/23 1544 08/13/23 2059 08/14/23 0611 08/14/23 1132  GLUCAP 114* 126* 175* 88 167*    Iron Studies:  Recent Labs    08/13/23 0224  IRON 50  TIBC 308  FERRITIN 140   Studies/Results: No results found.  Medications: Infusions:  furosemide 120 mg (08/14/23 0536)   iron sucrose Stopped (08/13/23 1643)    Scheduled Medications:  amLODipine  5 mg Oral Daily   aspirin EC  81 mg Oral Daily   atorvastatin  20 mg Oral Daily   carvedilol  3.125 mg Oral BID   cloNIDine  0.2 mg Oral QHS   clopidogrel  75 mg Oral Daily   cyanocobalamin  500 mcg Oral Daily   gabapentin  300 mg Oral BID   heparin injection (subcutaneous)  5,000 Units Subcutaneous Q8H   insulin aspart  0-6 Units Subcutaneous TID WC   insulin glargine  7 Units  Subcutaneous QHS   isosorbide mononitrate  60 mg Oral Daily   latanoprost  1 drop Both Eyes QHS   levothyroxine  50 mcg Oral Daily   metolazone  5 mg Oral Daily   sevelamer carbonate  800 mg Oral TID WC   tamsulosin  0.4 mg Oral QPC breakfast    have reviewed scheduled and prn medications.  Physical Exam: General:NAD, comfortable Heart:RRR, s1s2 nl Lungs:clear b/l, no crackle Abdomen:soft, Non-tender, non-distended Extremities: Bilateral lower extremities edema++ Neurology: Alert, awake and following commands.  Phillip [name] Phillip [name] 08/14/2023,12:12 PM  LOS: 2 days

## 2023-08-14 NOTE — Progress Notes (Addendum)
 PROGRESS NOTE    Phillip [name]  VWU:[id number] DOB: 1955-08-13 DOA: 08/11/2023 PCP: Gordan Payment., MD  67/M with hypertension, type 2 diabetes mellitus, CKD 5, CAD/CABG, chronic systolic CHF, hypothyroidism presented to the ED with worsening shortness of breath and swelling, with orthopnea.  Followed by nephrology in [facility], has been reluctant to consider dialysis. -Also recently underwent right and left heart cath 2/28 for TAVR workup. -In the ED noted to be significantly volume overloaded, hemoglobin 9.8, creatinine 6.1, BNP 685, chest x-ray with hypoinflation   Subjective: Breathing better, denies nausea vomiting or anorexia  Assessment and Plan:  AKI on CKD 5, progressive -Baseline creatinine around 3-4, has been up to the 5 range recently, nephrology consulting, considered ESRD at this time -Remains on high-dose diuretics with limited response -No indication for urgent dialysis today needs to start during this admission ideally, palliative consulted for goals of care, remains noncommittal, attempted to reach wife today X2 without success -Decreased gabapentin and Flexeril dose  Acute on chronic systolic CHF -Echo 1/25 at [facility] with EF 30-35% with wall motion abnormality, grade 2 DD, low-flow low gradient severe aortic stenosis, severe pulmonary hypertension -Complicated by CKD 5 -on high-dose IV Lasix, almost ESRD, patient is reluctant -Continue Coreg, Imdur  Severe aortic stenosis, low-flow low gradient -Noted on echo 1/25, seen by structural heart team and underwent right and left heart cath with plan to complete TAVR protocol following renal recovery -Follow-up with structural heart team   CAD Peripheral vascular disease Patient with prior history history of four-vessel CABG back in 2003.  Patient had left heart cath 07/2023 which showed patent LIMA to LAD but all 3 vein grafts occluded. -Continue aspirin, Plavix, beta-blocker, statin   Essential  hypertension Blood pressures currently maintained 138/76 to 146/81 -Continue amlodipine, Coreg, clonidine, isosorbide mononitrate   Aortic stenosis Plan had been for further workup for TAVR in the outpatient setting    Diabetes mellitus type 2, with long-term use of insulin Last available hemoglobin A1c was 7.2 when last checked on 12/13 -Continue home long-acting insulin -SSI   Pancytopenia Chronic.,  Stable monitor   Hyperlipidemia -Continue atorvastatin   Hypothyroidism TSH was noted to be 3.99 when checked on 07/01/2023. -Repeat TSH now is 7.9, check free T4/T3 in a.m. -Continue levothyroxine   BPH -Continue Flomax  DVT prophylaxis: Heparin  Advance Care Planning:   Code Status: Full Code   Consults: Cardiology and nephrology   Family Communication: None present Disposition: Pending stabilization of above issues   Consultants: Cards, nephrology   Procedures:   Antimicrobials:    Objective: Vitals:   08/14/23 0050 08/14/23 0516 08/14/23 0750 08/14/23 1115  BP: 120/79 131/78 132/69 131/62  Pulse:  71 69 63  Resp: 17 18 14 15   Temp: 97.6 F (36.4 C) 98.2 F (36.8 C) 97.8 F (36.6 C) (!) 97.4 F (36.3 C)  TempSrc: Oral Oral Oral Oral  SpO2: 100% 99% 99% 99%  Weight:  102.6 kg    Height:        Intake/Output Summary (Last 24 hours) at 08/14/2023 1228 Last data filed at 08/14/2023 0910 Gross per 24 hour  Intake 639.39 ml  Output 2050 ml  Net -1410.61 ml   Filed Weights   08/11/23 1833 08/13/23 0118 08/14/23 0516  Weight: 101.2 kg 104.4 kg 102.6 kg    Examination:  General exam: Chronically ill male sitting up in bed, AAOx3 HEENT: Positive JVD CVS: S1-S2, regular rhythm, systolic murmur Lungs: Few basilar Rales  Abdomen: Soft, nontender, bowel sounds present Extremities: 2+ edema  Psychiatry: Flat affect    Data Reviewed:   CBC: Recent Labs  Lab 08/11/23 1933 08/13/23 0224 08/14/23 0217  WBC 5.7 5.3 5.2  NEUTROABS 4.4  --   --    HGB 9.8* 8.5* 8.3*  HCT 30.9* 26.6* 25.1*  MCV 94.2 92.4 91.6  PLT 115* 108* 91*   Basic Metabolic Panel: Recent Labs  Lab 08/11/23 1933 08/13/23 0224 08/14/23 0217  NA 141 140 139  K 5.0 5.2* 4.8  CL 108 110 106  CO2 21* 19* 19*  GLUCOSE 112* 127* 114*  BUN 128* 130* 128*  CREATININE 6.17* 5.95* 6.07*  CALCIUM 7.7* 7.7* 7.6*  PHOS  --  6.2* 6.1*   GFR: Estimated Creatinine Clearance: 15.1 mL/min (A) (by C-G formula based on SCr of 6.07 mg/dL (H)). Liver Function Tests: Recent Labs  Lab 08/13/23 0224 08/14/23 0217  ALBUMIN 3.3* 3.2*   No results for input(s): "LIPASE", "AMYLASE" in the last 168 hours. No results for input(s): "AMMONIA" in the last 168 hours. Coagulation Profile: No results for input(s): "INR", "PROTIME" in the last 168 hours. Cardiac Enzymes: No results for input(s): "CKTOTAL", "CKMB", "CKMBINDEX", "TROPONINI" in the last 168 hours. BNP (last 3 results) Recent Labs    07/14/23 1412  PROBNP 11,269*   HbA1C: No results for input(s): "HGBA1C" in the last 72 hours. CBG: Recent Labs  Lab 08/13/23 1115 08/13/23 1544 08/13/23 2059 08/14/23 0611 08/14/23 1132  GLUCAP 114* 126* 175* 88 167*   Lipid Profile: No results for input(s): "CHOL", "HDL", "LDLCALC", "TRIG", "CHOLHDL", "LDLDIRECT" in the last 72 hours. Thyroid Function Tests: Recent Labs    08/11/23 1933  TSH 7.948*   Anemia Panel: Recent Labs    08/13/23 0224  FERRITIN 140  TIBC 308  IRON 50   Urine analysis:    Component Value Date/Time   COLORURINE YELLOW 11/07/2020 0744   APPEARANCEUR CLEAR 11/07/2020 0744   LABSPEC 1.011 11/07/2020 0744   PHURINE 5.0 11/07/2020 0744   GLUCOSEU 50 (A) 11/07/2020 0744   HGBUR SMALL (A) 11/07/2020 0744   BILIRUBINUR NEGATIVE 11/07/2020 0744   KETONESUR NEGATIVE 11/07/2020 0744   PROTEINUR 100 (A) 11/07/2020 0744   NITRITE NEGATIVE 11/07/2020 0744   LEUKOCYTESUR NEGATIVE 11/07/2020 0744   Sepsis  Labs: @LABRCNTIP (procalcitonin:4,lacticidven:4)  )No results found for this or any previous visit (from the past 240 hours).   Radiology Studies: No results found.    Scheduled Meds:  amLODipine  5 mg Oral Daily   aspirin EC  81 mg Oral Daily   atorvastatin  20 mg Oral Daily   carvedilol  3.125 mg Oral BID   cloNIDine  0.2 mg Oral QHS   clopidogrel  75 mg Oral Daily   cyanocobalamin  500 mcg Oral Daily   gabapentin  300 mg Oral BID   heparin injection (subcutaneous)  5,000 Units Subcutaneous Q8H   insulin aspart  0-6 Units Subcutaneous TID WC   insulin glargine  7 Units Subcutaneous QHS   isosorbide mononitrate  60 mg Oral Daily   latanoprost  1 drop Both Eyes QHS   levothyroxine  50 mcg Oral Daily   metolazone  5 mg Oral Daily   sevelamer carbonate  800 mg Oral TID WC   tamsulosin  0.4 mg Oral QPC breakfast   Continuous Infusions:  furosemide 120 mg (08/14/23 0536)   iron sucrose Stopped (08/13/23 1643)     LOS: 2 days    Time  spent:    Zannie Cove, MD [facility]   08/14/2023, 12:28 PM

## 2023-08-14 NOTE — Progress Notes (Signed)
 Mobility Specialist Progress Note:    08/14/23 1408  Mobility  Activity Ambulated with assistance in hallway  Level of Assistance Contact guard assist, steadying assist  Assistive Device Cane  Distance Ambulated (ft) 230 ft  Activity Response Tolerated well  Mobility Referral Yes  Mobility visit 1 Mobility  Mobility Specialist Start Time (ACUTE ONLY) 1100  Mobility Specialist Stop Time (ACUTE ONLY) 1110  Mobility Specialist Time Calculation (min) (ACUTE ONLY) 10 min   Received pt in chair having no complaints and agreeable to mobility. Distance limited d/t RLE pain and tightness. Returned to room w/o fault. Left in chair w/ call bell in reach and all needs met.   Thompson Grayer Mobility Specialist  Please contact vis Secure Chat or  Rehab Office [phone]

## 2023-08-15 DIAGNOSIS — I5020 Unspecified systolic (congestive) heart failure: Secondary | ICD-10-CM | POA: Diagnosis not present

## 2023-08-15 LAB — CBC
HCT: 24.2 % — ABNORMAL LOW (ref 39.0–52.0)
Hemoglobin: 7.9 g/dL — ABNORMAL LOW (ref 13.0–17.0)
MCH: 29.6 pg (ref 26.0–34.0)
MCHC: 32.6 g/dL (ref 30.0–36.0)
MCV: 90.6 fL (ref 80.0–100.0)
Platelets: 91 10*3/uL — ABNORMAL LOW (ref 150–400)
RBC: 2.67 MIL/uL — ABNORMAL LOW (ref 4.22–5.81)
RDW: 14.6 % (ref 11.5–15.5)
WBC: 5 10*3/uL (ref 4.0–10.5)
nRBC: 0 % (ref 0.0–0.2)

## 2023-08-15 LAB — RENAL FUNCTION PANEL
Albumin: 3 g/dL — ABNORMAL LOW (ref 3.5–5.0)
Anion gap: 13 (ref 5–15)
BUN: 135 mg/dL — ABNORMAL HIGH (ref 8–23)
CO2: 21 mmol/L — ABNORMAL LOW (ref 22–32)
Calcium: 7.6 mg/dL — ABNORMAL LOW (ref 8.9–10.3)
Chloride: 105 mmol/L (ref 98–111)
Creatinine, Ser: 5.98 mg/dL — ABNORMAL HIGH (ref 0.61–1.24)
GFR, Estimated: 10 mL/min — ABNORMAL LOW (ref >60)
Glucose, Bld: 142 mg/dL — ABNORMAL HIGH (ref 70–99)
Phosphorus: 5.8 mg/dL — ABNORMAL HIGH (ref 2.5–4.6)
Potassium: 4.3 mmol/L (ref 3.5–5.1)
Sodium: 139 mmol/L (ref 135–145)

## 2023-08-15 LAB — GLUCOSE, CAPILLARY
Glucose-Capillary: 113 mg/dL — ABNORMAL HIGH (ref 70–99)
Glucose-Capillary: 185 mg/dL — ABNORMAL HIGH (ref 70–99)
Glucose-Capillary: 110 mg/dL — ABNORMAL HIGH (ref 70–99)
Glucose-Capillary: 153 mg/dL — ABNORMAL HIGH (ref 70–99)

## 2023-08-15 MED ORDER — DARBEPOETIN ALFA 200 MCG/0.4ML IJ SOSY
200.0000 ug | PREFILLED_SYRINGE | INTRAMUSCULAR | Status: DC
  Filled 2023-08-15: qty 0.4

## 2023-08-15 MED ORDER — DARBEPOETIN ALFA 150 MCG/0.3ML IJ SOSY
120.0000 ug | PREFILLED_SYRINGE | Freq: Once | INTRAMUSCULAR | Status: AC
  Administered 2023-08-15: 120 ug via SUBCUTANEOUS
  Filled 2023-08-15: qty 0.3

## 2023-08-15 MED ORDER — DARBEPOETIN ALFA 200 MCG/0.4ML IJ SOSY: 200.0000 ug | PREFILLED_SYRINGE | INTRAMUSCULAR | Status: DC

## 2023-08-15 MED ORDER — DARBEPOETIN ALFA 200 MCG/0.4ML IJ SOSY
200.0000 ug | PREFILLED_SYRINGE | INTRAMUSCULAR | Status: DC
  Administered 2023-08-22: 200 ug via SUBCUTANEOUS
  Filled 2023-08-15: qty 0.4

## 2023-08-15 NOTE — Progress Notes (Signed)
 Mobility Specialist Progress Note:   08/15/23 0920  Mobility  Activity Ambulated with assistance in hallway  Level of Assistance Contact guard assist, steadying assist  Assistive Device Cane  Distance Ambulated (ft) 200 ft  Activity Response Tolerated well  Mobility Referral Yes  Mobility visit 1 Mobility  Mobility Specialist Start Time (ACUTE ONLY) 0920  Mobility Specialist Stop Time (ACUTE ONLY) 0930  Mobility Specialist Time Calculation (min) (ACUTE ONLY) 10 min   Pt agreeable to mobility session. Required only minG assist during ambulation with cane. No overt LOB noted. Pt back in chair with all needs met.   Addison Lank Mobility Specialist Please contact via SecureChat or  Rehab office at [phone]

## 2023-08-15 NOTE — Progress Notes (Signed)
 PROGRESS NOTE    Phillip [name]  ZOX:[id number] DOB: 1956-05-20 DOA: 08/11/2023 PCP: Gordan Payment., MD  67/M with hypertension, type 2 diabetes mellitus, CKD 5, CAD/CABG, chronic systolic CHF, hypothyroidism presented to the ED with worsening shortness of breath and swelling, with orthopnea.  Followed by nephrology in [facility], has been reluctant to consider dialysis. -Also recently underwent right and left heart cath 2/28 for TAVR workup. -In the ED noted to be significantly volume overloaded, hemoglobin 9.8, creatinine 6.1, BNP 685, chest x-ray with hypoinflation   Subjective: Breathing better, denies nausea vomiting or anorexia  Assessment and Plan:  AKI on CKD 5, progressive -Baseline creatinine around 3-4, has been up to the 5 range recently, nephrology consulting, considered ESRD at this time -Remains on high-dose diuretics, urine output is improving, but suboptimal -No indication for urgent dialysis today needs to start during this admission ideally, palliative consulted for goals of care, patient wishes to hold off on starting dialysis this admission -Decreased gabapentin and Flexeril dose  Acute on chronic systolic CHF -Echo 1/25 at [facility] with EF 30-35% with wall motion abnormality, grade 2 DD, low-flow low gradient severe aortic stenosis, severe pulmonary hypertension -Complicated by CKD 5 -on high-dose IV Lasix, almost ESRD, patient is reluctant -Continue Coreg, Imdur  Severe aortic stenosis, low-flow low gradient -Noted on echo 1/25, seen by structural heart team and underwent right and left heart cath with plan to complete TAVR protocol following renal recovery -Follow-up with structural heart team after dialysis   CAD Peripheral vascular disease Patient with prior history history of four-vessel CABG back in 2003.  Patient had left heart cath 07/2023 which showed patent LIMA to LAD but all 3 vein grafts occluded. -Continue aspirin, Plavix, beta-blocker,  statin   Essential hypertension Blood pressures currently maintained 138/76 to 146/81 -Continue amlodipine, Coreg, clonidine, isosorbide mononitrate   Diabetes mellitus type 2, with long-term use of insulin Last available hemoglobin A1c was 7.2 when last checked on 12/13 -Continue home long-acting insulin -SSI   Pancytopenia Chronic.,  Stable monitor   Hyperlipidemia -Continue atorvastatin   Hypothyroidism TSH was noted to be 3.99 when checked on 07/01/2023. -Repeat TSH now is 7.9, check free T4/T3 in a.m. -Continue levothyroxine   BPH -Continue Flomax  DVT prophylaxis: Heparin  Advance Care Planning:   Code Status: Full Code   Consults: Cardiology and nephrology   Family Communication: None present Disposition: Pending stabilization of above issues   Consultants: Cards, nephrology   Procedures:   Antimicrobials:    Objective: Vitals:   08/14/23 2315 08/15/23 0450 08/15/23 0731 08/15/23 1116  BP: (!) 140/67 128/66 132/75 133/69  Pulse: 78 68 65 69  Resp: 17 16 18 14   Temp: 98.3 F (36.8 C) 97.7 F (36.5 C) 98.4 F (36.9 C) 97.6 F (36.4 C)  TempSrc: Oral Oral Oral Oral  SpO2: 99% 100% 97% 98%  Weight:  99.9 kg    Height:        Intake/Output Summary (Last 24 hours) at 08/15/2023 1147 Last data filed at 08/15/2023 0949 Gross per 24 hour  Intake 757.24 ml  Output 2675 ml  Net -1917.76 ml   Filed Weights   08/13/23 0118 08/14/23 0516 08/15/23 0450  Weight: 104.4 kg 102.6 kg 99.9 kg    Examination: Gen: Awake, Alert, Oriented X 3,  HEENT: no JVD Lungs: Good air movement bilaterally, CTAB CVS: S1S2/RRR Abd: soft, Non tender, non distended, BS present Extremities: 2+ edema  Psychiatry: Flat affect  Data Reviewed:   CBC: Recent Labs  Lab 08/11/23 1933 08/13/23 0224 08/14/23 0217 08/15/23 0235  WBC 5.7 5.3 5.2 5.0  NEUTROABS 4.4  --   --   --   HGB 9.8* 8.5* 8.3* 7.9*  HCT 30.9* 26.6* 25.1* 24.2*  MCV 94.2 92.4 91.6 90.6  PLT 115*  108* 91* 91*   Basic Metabolic Panel: Recent Labs  Lab 08/11/23 1933 08/13/23 0224 08/14/23 0217 08/15/23 0235  NA 141 140 139 139  K 5.0 5.2* 4.8 4.3  CL 108 110 106 105  CO2 21* 19* 19* 21*  GLUCOSE 112* 127* 114* 142*  BUN 128* 130* 128* 135*  CREATININE 6.17* 5.95* 6.07* 5.98*  CALCIUM 7.7* 7.7* 7.6* 7.6*  PHOS  --  6.2* 6.1* 5.8*   GFR: Estimated Creatinine Clearance: 14.1 mL/min (A) (by C-G formula based on SCr of 5.98 mg/dL (H)). Liver Function Tests: Recent Labs  Lab 08/13/23 0224 08/14/23 0217 08/15/23 0235  ALBUMIN 3.3* 3.2* 3.0*   No results for input(s): "LIPASE", "AMYLASE" in the last 168 hours. No results for input(s): "AMMONIA" in the last 168 hours. Coagulation Profile: No results for input(s): "INR", "PROTIME" in the last 168 hours. Cardiac Enzymes: No results for input(s): "CKTOTAL", "CKMB", "CKMBINDEX", "TROPONINI" in the last 168 hours. BNP (last 3 results) Recent Labs    07/14/23 1412  PROBNP 11,269*   HbA1C: No results for input(s): "HGBA1C" in the last 72 hours. CBG: Recent Labs  Lab 08/14/23 1132 08/14/23 1537 08/14/23 2051 08/15/23 0512 08/15/23 1112  GLUCAP 167* 136* 151* 113* 185*   Lipid Profile: No results for input(s): "CHOL", "HDL", "LDLCALC", "TRIG", "CHOLHDL", "LDLDIRECT" in the last 72 hours. Thyroid Function Tests: No results for input(s): "TSH", "T4TOTAL", "FREET4", "T3FREE", "THYROIDAB" in the last 72 hours.  Anemia Panel: Recent Labs    08/13/23 0224  FERRITIN 140  TIBC 308  IRON 50   Urine analysis:    Component Value Date/Time   COLORURINE YELLOW 11/07/2020 0744   APPEARANCEUR CLEAR 11/07/2020 0744   LABSPEC 1.011 11/07/2020 0744   PHURINE 5.0 11/07/2020 0744   GLUCOSEU 50 (A) 11/07/2020 0744   HGBUR SMALL (A) 11/07/2020 0744   BILIRUBINUR NEGATIVE 11/07/2020 0744   KETONESUR NEGATIVE 11/07/2020 0744   PROTEINUR 100 (A) 11/07/2020 0744   NITRITE NEGATIVE 11/07/2020 0744   LEUKOCYTESUR NEGATIVE  11/07/2020 0744   Sepsis Labs: @LABRCNTIP (procalcitonin:4,lacticidven:4)  )No results found for this or any previous visit (from the past 240 hours).   Radiology Studies: No results found.    Scheduled Meds:  amLODipine  5 mg Oral Daily   aspirin EC  81 mg Oral Daily   atorvastatin  20 mg Oral Daily   carvedilol  3.125 mg Oral BID   cloNIDine  0.2 mg Oral QHS   clopidogrel  75 mg Oral Daily   cyanocobalamin  500 mcg Oral Daily   darbepoetin (ARANESP) injection - NON-DIALYSIS  200 mcg Subcutaneous Q Fri-1800   gabapentin  300 mg Oral QHS   heparin injection (subcutaneous)  5,000 Units Subcutaneous Q8H   insulin aspart  0-6 Units Subcutaneous TID WC   insulin glargine  7 Units Subcutaneous QHS   isosorbide mononitrate  60 mg Oral Daily   latanoprost  1 drop Both Eyes QHS   levothyroxine  50 mcg Oral Daily   metolazone  5 mg Oral Daily   sevelamer carbonate  800 mg Oral TID WC   tamsulosin  0.4 mg Oral QPC breakfast   Continuous Infusions:  furosemide 120 mg (08/15/23 0545)   iron sucrose Stopped (08/14/23 1637)     LOS: 3 days    Time spent:    Zannie Cove, MD [facility]   08/15/2023, 11:47 AM

## 2023-08-15 NOTE — Plan of Care (Signed)

## 2023-08-15 NOTE — Progress Notes (Signed)
 [facility] NEPHROLOGY PROGRESS NOTE  Assessment/ Plan: Pt is a 68 y.o. yo male with history of HTN, DM, CKD 5 thought to be due to diabetic nephropathy followed by Dr. Janit Pagan at [facility] with a recent baseline creatinine level seems to be around 5, CHF with preserved EF, CAD status post CABG, presented with worsening dyspnea on exertion, lower extremity edema worsening for 3 to 4 weeks.    #Acute kidney injury on CKD 5 with fluid overload/CHF exacerbation: CKD thought to be due to diabetic nephropathy, follows with nephrologist in [facility].  This sounds to me like progressive CKD.  Urine output is increasing with current high-dose IV diuretics and metolazone.  We will continue current diuretics especially since he still has significant lower extremity edema.  He is not interested in dialysis at least this admit- therefore palliative care team was consulted to address goals of care.  I dont feel like he will totally refuse.  He has a cousin and friend on dialysis and they "look terrible"  I attempted to educate that not everyones experience is the same. Fortunately, he has no uremic symptoms and no urgent need for HD  Strict ins and out and monitor lab.   #Acute on chronic CHF: Cardiology seeing,  diuretics.  Lasix 120 q 8 and metolazone 5 daily    #Hypertension/volume: Continue antihypertensives and volume management with diuretics.  Continue fluid restriction and low-sodium diet.   #Anemia of CKD: Iron saturation 16% therefore ordered IV iron and Aranesp.     #CKD-MBD/hyperphosphatemia: Pending PTH level and started sevelamer as a phosphorus binders.   Subjective: Seen and examined at bedside.  Urine output is recorded around 2.9 L. Negative 2 liters, weight down   He denies nausea, vomiting, dysphagia, chest pain or shortness of breath.  We talked more about dialysis-  he told me he was leaning toward doing it-  I discussed further and he clarified "not NOW" is not interested  during this hospitalization   Objective Vital signs in last 24 hours: Vitals:   08/14/23 2010 08/14/23 2315 08/15/23 0450 08/15/23 0731  BP: (!) 149/80 (!) 140/67 128/66 132/75  Pulse: 77 78 68 65  Resp: 17 17 16 18   Temp: 97.7 F (36.5 C) 98.3 F (36.8 C) 97.7 F (36.5 C) 98.4 F (36.9 C)  TempSrc: Oral Oral Oral Oral  SpO2: 99% 99% 100% 97%  Weight:   99.9 kg   Height:       Weight change: -2.658 kg  Intake/Output Summary (Last 24 hours) at 08/15/2023 0919 Last data filed at 08/15/2023 0914 Gross per 24 hour  Intake 757.24 ml  Output 2400 ml  Net -1642.76 ml       Labs: RENAL PANEL Recent Labs  Lab 08/11/23 1933 08/13/23 0224 08/14/23 0217 08/15/23 0235  NA 141 140 139 139  K 5.0 5.2* 4.8 4.3  CL 108 110 106 105  CO2 21* 19* 19* 21*  GLUCOSE 112* 127* 114* 142*  BUN 128* 130* 128* 135*  CREATININE 6.17* 5.95* 6.07* 5.98*  CALCIUM 7.7* 7.7* 7.6* 7.6*  PHOS  --  6.2* 6.1* 5.8*  ALBUMIN  --  3.3* 3.2* 3.0*    Liver Function Tests: Recent Labs  Lab 08/13/23 0224 08/14/23 0217 08/15/23 0235  ALBUMIN 3.3* 3.2* 3.0*   No results for input(s): "LIPASE", "AMYLASE" in the last 168 hours. No results for input(s): "AMMONIA" in the last 168 hours. CBC: Recent Labs    08/01/23 1142 08/11/23 1933  08/13/23 0224 08/14/23 0217 08/15/23 0235  HGB 9.5* 9.8* 8.5* 8.3* 7.9*  MCV  --  94.2 92.4 91.6 90.6  FERRITIN  --   --  140  --   --   TIBC  --   --  308  --   --   IRON  --   --  50  --   --     Cardiac Enzymes: No results for input(s): "CKTOTAL", "CKMB", "CKMBINDEX", "TROPONINI" in the last 168 hours. CBG: Recent Labs  Lab 08/14/23 0611 08/14/23 1132 08/14/23 1537 08/14/23 2051 08/15/23 0512  GLUCAP 88 167* 136* 151* 113*    Iron Studies:  Recent Labs    08/13/23 0224  IRON 50  TIBC 308  FERRITIN 140   Studies/Results: No results found.  Medications: Infusions:  furosemide 120 mg (08/15/23 0545)   iron sucrose Stopped (08/14/23 1637)     Scheduled Medications:  amLODipine  5 mg Oral Daily   aspirin EC  81 mg Oral Daily   atorvastatin  20 mg Oral Daily   carvedilol  3.125 mg Oral BID   cloNIDine  0.2 mg Oral QHS   clopidogrel  75 mg Oral Daily   cyanocobalamin  500 mcg Oral Daily   gabapentin  300 mg Oral QHS   heparin injection (subcutaneous)  5,000 Units Subcutaneous Q8H   insulin aspart  0-6 Units Subcutaneous TID WC   insulin glargine  7 Units Subcutaneous QHS   isosorbide mononitrate  60 mg Oral Daily   latanoprost  1 drop Both Eyes QHS   levothyroxine  50 mcg Oral Daily   metolazone  5 mg Oral Daily   sevelamer carbonate  800 mg Oral TID WC   tamsulosin  0.4 mg Oral QPC breakfast    have reviewed scheduled and prn medications.  Physical Exam: General:NAD, comfortable Heart:RRR, s1s2 nl Lungs:clear b/l, no crackle Abdomen:soft, Non-tender, non-distended Extremities: Bilateral lower extremities edema++ Neurology: Alert, awake and following commands. Making jokes  Cecille Aver 08/15/2023,9:19 AM  LOS: 3 days

## 2023-08-15 NOTE — Plan of Care (Signed)
  Problem: Coping: Goal: Ability to adjust to condition or change in health will improve Outcome: Progressing   Problem: Fluid Volume: Goal: Ability to maintain a balanced intake and output will improve Outcome: Progressing   Problem: Health Behavior/Discharge Planning: Goal: Ability to manage health-related needs will improve Outcome: Progressing

## 2023-08-16 DIAGNOSIS — I5020 Unspecified systolic (congestive) heart failure: Secondary | ICD-10-CM | POA: Diagnosis not present

## 2023-08-16 LAB — CBC
HCT: 27.5 % — ABNORMAL LOW (ref 39.0–52.0)
Hemoglobin: 9 g/dL — ABNORMAL LOW (ref 13.0–17.0)
MCH: 29.9 pg (ref 26.0–34.0)
MCHC: 32.7 g/dL (ref 30.0–36.0)
MCV: 91.4 fL (ref 80.0–100.0)
Platelets: 104 10*3/uL — ABNORMAL LOW (ref 150–400)
RBC: 3.01 MIL/uL — ABNORMAL LOW (ref 4.22–5.81)
RDW: 14.6 % (ref 11.5–15.5)
WBC: 5.9 10*3/uL (ref 4.0–10.5)
nRBC: 0 % (ref 0.0–0.2)

## 2023-08-16 LAB — BASIC METABOLIC PANEL
Anion gap: 15 (ref 5–15)
BUN: 143 mg/dL — ABNORMAL HIGH (ref 8–23)
CO2: 21 mmol/L — ABNORMAL LOW (ref 22–32)
Calcium: 8.1 mg/dL — ABNORMAL LOW (ref 8.9–10.3)
Chloride: 104 mmol/L (ref 98–111)
Creatinine, Ser: 6.09 mg/dL — ABNORMAL HIGH (ref 0.61–1.24)
GFR, Estimated: 9 mL/min — ABNORMAL LOW (ref >60)
Glucose, Bld: 129 mg/dL — ABNORMAL HIGH (ref 70–99)
Potassium: 4.2 mmol/L (ref 3.5–5.1)
Sodium: 140 mmol/L (ref 135–145)

## 2023-08-16 LAB — GLUCOSE, CAPILLARY
Glucose-Capillary: 107 mg/dL — ABNORMAL HIGH (ref 70–99)
Glucose-Capillary: 125 mg/dL — ABNORMAL HIGH (ref 70–99)
Glucose-Capillary: 162 mg/dL — ABNORMAL HIGH (ref 70–99)
Glucose-Capillary: 198 mg/dL — ABNORMAL HIGH (ref 70–99)

## 2023-08-16 LAB — HEPATITIS B SURFACE ANTIGEN: Hepatitis B Surface Ag: NONREACTIVE

## 2023-08-16 LAB — T4, FREE: Free T4: 0.82 ng/dL (ref 0.61–1.12)

## 2023-08-16 MED ORDER — CHLORHEXIDINE GLUCONATE CLOTH 2 % EX PADS
6.0000 | MEDICATED_PAD | Freq: Every day | CUTANEOUS | Status: DC
  Administered 2023-08-17 – 2023-08-25 (×9): 6 via TOPICAL

## 2023-08-16 MED ORDER — ISOSORBIDE MONONITRATE ER 30 MG PO TB24
30.0000 mg | ORAL_TABLET | Freq: Every day | ORAL | Status: DC
  Administered 2023-08-17 – 2023-08-25 (×8): 30 mg via ORAL
  Filled 2023-08-16 (×8): qty 1

## 2023-08-16 MED ORDER — CEFAZOLIN SODIUM-DEXTROSE 2-4 GM/100ML-% IV SOLN: 2.0000 g | INTRAVENOUS | Status: DC

## 2023-08-16 MED ORDER — CLONIDINE HCL 0.1 MG PO TABS
0.1000 mg | ORAL_TABLET | Freq: Every day | ORAL | Status: DC
  Administered 2023-08-16 – 2023-08-17 (×2): 0.1 mg via ORAL
  Filled 2023-08-16 (×2): qty 1

## 2023-08-16 MED ORDER — CALCITRIOL 0.25 MCG PO CAPS
0.2500 ug | ORAL_CAPSULE | Freq: Every day | ORAL | Status: DC
  Administered 2023-08-16 – 2023-08-25 (×9): 0.25 ug via ORAL
  Filled 2023-08-16 (×9): qty 1

## 2023-08-16 NOTE — Progress Notes (Signed)
 [name]PHR[facility] PROGRESS NOTE  Assessment/ Plan: Pt is a 68 y.o. yo male with HTN, DM, CKD 5 thought to be due to diabetic nephropathy followed by Dr. Janit Pagan at [facility] with a recent baseline creatinine level seems to be around 5, CHF with preserved EF, CAD status post CABG, presented with worsening dyspnea on exertion, lower extremity edema worsening for 3 to 4 weeks.    #Acute kidney injury on CKD 5 with fluid overload/CHF exacerbation: CKD thought to be due to diabetic nephropathy, follows with nephrologist in [facility].  This sounds like progressive CKD.  Urine output is increasing with current high-dose IV diuretics and metolazone.  We will continue current diuretics especially since he still has significant lower extremity edema.  He was not interested in dialysis at least this admit- therefore palliative care team was consulted to address goals of care.  I dont feel like he will totally refuse.  He has a cousin and friend on dialysis and they "look terrible"  I attempted to educate that not everyones experience is the same. Fortunately, he has no ( or at least will not admit to )  uremic symptoms and no urgent need for HD.  He now says he will do-  will tentatively plan for [facility] and to start on Monday -  he is making noises about refusing but I dont suspect he will do.  Starting on dialysis now is going to be the most efficient way to get him better faster    #Acute on chronic CHF: Cardiology seeing,  diuretics.  Lasix 120 q 8 and metolazone 5 daily  will continue   #Hypertension/volume: Continue antihypertensives and volume management with diuretics.  Continue fluid restriction and low-sodium diet.   #Anemia of CKD: Iron saturation 16% therefore ordered IV iron and Aranesp.     #CKD-MBD/hyperphosphatemia: PTH 332 - and started sevelamer as a phosphorus binders and low dose calcitriol.   Subjective: Seen and examined at bedside.  Urine output is recorded at 3.2 L.  Negative 2 3 liters, weight down   He denies nausea, vomiting, dysphagia, chest pain or shortness of breath.  We talked more about dialysis-  " I guess that I will do it, make everyone happy"  I told him we could arrange for Monday "Monday ?!?"  Objective Vital signs in last 24 hours: Vitals:   08/15/23 2347 08/16/23 0405 08/16/23 0449 08/16/23 0747  BP: (!) 142/78 131/69  136/72  Pulse:    68  Resp: 14 13 14    Temp: 98.4 F (36.9 C) 98.7 F (37.1 C)  98.6 F (37 C)  TempSrc: Oral Oral  Oral  SpO2: 100% 98%    Weight:   98.7 kg   Height:       Weight change: -1.2 kg  Intake/Output Summary (Last 24 hours) at 08/16/2023 0906 Last data filed at 08/16/2023 0746 Gross per 24 hour  Intake 919.07 ml  Output 3650 ml  Net -2730.93 ml       Labs: RENAL PANEL Recent Labs  Lab 08/11/23 1933 08/13/23 0224 08/14/23 0217 08/15/23 0235 08/16/23 0324  NA 141 140 139 139 140  K 5.0 5.2* 4.8 4.3 4.2  CL 108 110 106 105 104  CO2 21* 19* 19* 21* 21*  GLUCOSE 112* 127* 114* 142* 129*  BUN 128* 130* 128* 135* 143*  CREATININE 6.17* 5.95* 6.07* 5.98* 6.09*  CALCIUM 7.7* 7.7* 7.6* 7.6* 8.1*  PHOS  --  6.2* 6.1* 5.8*  --  ALBUMIN  --  3.3* 3.2* 3.0*  --     Liver Function Tests: Recent Labs  Lab 08/13/23 0224 08/14/23 0217 08/15/23 0235  ALBUMIN 3.3* 3.2* 3.0*   No results for input(s): "LIPASE", "AMYLASE" in the last 168 hours. No results for input(s): "AMMONIA" in the last 168 hours. CBC: Recent Labs    08/11/23 1933 08/13/23 0224 08/14/23 0217 08/15/23 0235 08/16/23 0324  HGB 9.8* 8.5* 8.3* 7.9* 9.0*  MCV 94.2 92.4 91.6 90.6 91.4  FERRITIN  --  140  --   --   --   TIBC  --  308  --   --   --   IRON  --  50  --   --   --     Cardiac Enzymes: No results for input(s): "CKTOTAL", "CKMB", "CKMBINDEX", "TROPONINI" in the last 168 hours. CBG: Recent Labs  Lab 08/15/23 0512 08/15/23 1112 08/15/23 1559 08/15/23 2045 08/16/23 0548  GLUCAP 113* 185* 110* 153* 107*     Iron Studies:  No results for input(s): "IRON", "TIBC", "TRANSFERRIN", "FERRITIN" in the last 72 hours.  Studies/Results: No results found.  Medications: Infusions:  furosemide 120 mg (08/16/23 0655)    Scheduled Medications:  amLODipine  5 mg Oral Daily   aspirin EC  81 mg Oral Daily   atorvastatin  20 mg Oral Daily   carvedilol  3.125 mg Oral BID   cloNIDine  0.2 mg Oral QHS   clopidogrel  75 mg Oral Daily   cyanocobalamin  500 mcg Oral Daily   [START ON 08/22/2023] darbepoetin (ARANESP) injection - NON-DIALYSIS  200 mcg Subcutaneous Q Fri-1800   gabapentin  300 mg Oral QHS   heparin injection (subcutaneous)  5,000 Units Subcutaneous Q8H   insulin aspart  0-6 Units Subcutaneous TID WC   insulin glargine  7 Units Subcutaneous QHS   isosorbide mononitrate  60 mg Oral Daily   latanoprost  1 drop Both Eyes QHS   levothyroxine  50 mcg Oral Daily   metolazone  5 mg Oral Daily   sevelamer carbonate  800 mg Oral TID WC   tamsulosin  0.4 mg Oral QPC breakfast    have reviewed scheduled and prn medications.  Physical Exam: General:NAD, comfortable Heart:RRR, s1s2 nl Lungs:clear b/l, no crackle Abdomen:soft, Non-tender, non-distended Extremities: Bilateral lower extremities edema++ Neurology: Alert, awake and following commands. Making jokes  Cecille Aver 08/16/2023,9:06 AM  LOS: 4 days

## 2023-08-16 NOTE — Consult Note (Signed)
 Chief Complaint: Progressive chronic kidney disease; referred  for tunneled hemodialysis catheter placement  Referring Provider(s): Goldsborough,K  Supervising Physician: Oley Balm  Patient Status: [facility] - In-pt  History of Present Illness: Phillip [name] is a 68 y.o. male with past medical history significant for anemia, aortic stenosis, asthma, hypertension, BPH, bilateral lower extremity edema, coronary artery disease with prior MI/CABG, CHF, GERD, hyperlipidemia, hypothyroidism, peripheral vascular disease, diabetes, pulmonary hypertension, sleep apnea, now with worsening dyspnea on exertion and lower extremity edema , creat 6.09 c/w progressive chronic kidney disease.  Request now received from nephrology for tunneled hemodialysis catheter placement.  Known to IR team from pericolonic abscess drainage in 2020.   Patient is Full Code  Past Medical History:  Diagnosis Date   Abdominopelvic abscess (HCC) 09/29/2018   Abscess of appendix 09/22/2018   Acquired spondylolisthesis of lumbosacral region 03/20/2020   Anemia of chronic disease 08/18/2019   Aortic atherosclerosis (HCC)    Aortic stenosis    a.) TTE 09/15/2018: mild-mod (MPG 19); b.) TTE 03/21/2021: mild-mod (MPG 14.3); c.) TTE 04/24/2022: mild- mod (MPG 15)   Appendicitis 09/08/2018   Asthma    Benign hypertension with CKD (chronic kidney disease) stage IV (HCC) 02/24/2018   Benign prostatic hyperplasia without lower urinary tract symptoms 01/14/2018   Bilateral lower extremity edema 11/04/2018   Bradycardia 03/25/2018   Bruit of right carotid artery 07/26/2015   CAD (coronary artery disease) 2003   a.) s/p 4v CABG 2003   Cardiac murmur 07/26/2015   Chronic combined systolic and diastolic CHF (congestive heart failure) (HCC) 07/26/2015   a.) TTE 09/15/2018: EF 50-55%, mod LVH, RVE, BAE, mild-mod TR, AoV sclerosis, G1DD; b.) MPI 10/27/2019: EF <30%; c.) TTE 03/21/2021: EF 35-40%, post AK, inf HK, mod LVH, mod red  RVSF, mod LAE, mod Aov sclerosis, G2DD; c.) TTE 04/24/2022: EF 35-40%, post AK, glok HK, mod LVE, mod red RVSF, mild-mod MR, Aov sclerosis, G3DD   Chronic idiopathic constipation 11/02/2018   Chronic low back pain without sciatica 12/02/2018   Chronic pain of right hip 03/31/2020   CKD (chronic kidney disease) stage 4, GFR 15-29 ml/min (HCC) 10/07/2018   Coronary artery disease    Dysphagia 07/26/2015   Erectile dysfunction 07/14/2017   Essential hypertension 02/24/2018   Foot ulcer, left (HCC)    GERD (gastroesophageal reflux disease)    Hematuria 07/27/2015   History of marijuana use    Hyperlipidemia 07/26/2015   Hypertension    Hypothyroidism 11/14/2015   Insomnia 11/02/2018   Lumbar spondylosis 03/20/2020   Malaise and fatigue 03/25/2018   Myocardial infarction due to demand ischemia (HCC) 09/26/2020   a.) Type II NSTEMI; b.) troponins were trended 0.54 --> 0.56 --> 0.52 ng/mL   Non-compliance 05/05/2019   Osseous and subluxation stenosis of intervertebral foramina of lumbar region 03/20/2020   Osteoarthritis 10/06/2019   Peripheral vascular disease (HCC)    Personal history of tobacco use, presenting hazards to health 09/27/2020   Polyneuropathy in diabetes (HCC) 05/07/2019   Pulmonary HTN (HCC)    a.) TTE 05/12/2019: PASP 71; b.) TTE 09/27/2020: PASP >70; c.) TTE 03/21/2021: RVSP 43; d.) TTE 04/24/2022: RVSP 80.6   Puncture wound of right hip 04/02/2018   PVD (peripheral vascular disease) with claudication (HCC)    a.) s/p PTA 03/02/2018 - balloon angioplasty LEFT below knee popliteal artery; b.) s/p PTA 09/30/2022: baloon angioplasty LEFT tibioperoneal trunck, most proximal peroneal artery, and LEFT popliteal artery.   S/P CABG x 4 2003   Sleep  apnea 07/26/2015   a.) does not require nocturnal PAP therapy   Subacute osteomyelitis of left foot (HCC) 03/25/2018   Thrombocytopenia (HCC)    Type 2 diabetes mellitus with stage 4 chronic kidney disease, with long-term current  use of insulin (HCC) 07/26/2015   Vitamin B12 deficiency 06/16/2019   Vitamin D deficiency 05/07/2018    Past Surgical History:  Procedure Laterality Date   AMPUTATION TOE Left 03/13/2018   Procedure: AMPUTATION TOE-MPJ;  Surgeon: Gwyneth Revels, DPM;  Location: [facility] ORS;  Service: Podiatry;  Laterality: Left;   ANGIOPLASTY     CARDIAC CATHETERIZATION     CORONARY ARTERY BYPASS GRAFT N/A 2003   LAPAROSCOPIC APPENDECTOMY N/A 09/08/2018   Procedure: APPENDECTOMY LAPAROSCOPIC;  Surgeon: Leafy Ro, MD;  Location: [facility] ORS;  Service: General;  Laterality: N/A;   LOWER EXTREMITY ANGIOGRAPHY Left 03/02/2018   Procedure: LOWER EXTREMITY ANGIOGRAPHY;  Surgeon: Annice Needy, MD;  Location: [facility] INVASIVE CV LAB;  Service: Cardiovascular;  Laterality: Left;   LOWER EXTREMITY ANGIOGRAPHY Left 09/30/2022   Procedure: Lower Extremity Angiography;  Surgeon: Annice Needy, MD;  Location: [facility] INVASIVE CV LAB;  Service: Cardiovascular;  Laterality: Left;   RIGHT/LEFT HEART CATH AND CORONARY/GRAFT ANGIOGRAPHY N/A 08/01/2023   Procedure: RIGHT/LEFT HEART CATH AND CORONARY/GRAFT ANGIOGRAPHY;  Surgeon: Orbie Pyo, MD;  Location: [facility] INVASIVE CV LAB;  Service: Cardiovascular;  Laterality: N/A;   ROTATOR CUFF REPAIR Left     Allergies: Patient has no known allergies.  Medications: Prior to Admission medications   Medication Sig Start Date End Date Taking? Authorizing Provider  acetaminophen (TYLENOL) 500 MG tablet Take 1,000 mg by mouth every 6 (six) hours as needed for moderate pain or headache.    Yes [provider]  amLODipine (NORVASC) 5 MG tablet Take 1 tablet (5 mg total) by mouth daily. 05/16/23 08/14/23 Yes Flossie Dibble, NP  aspirin EC 81 MG tablet Take 1 tablet (81 mg total) by mouth daily. Swallow whole. 07/25/23  Yes Orbie Pyo, MD  atorvastatin (LIPITOR) 20 MG tablet Take 1 tablet (20 mg total) by mouth daily. 07/25/23  Yes Orbie Pyo, MD  bumetanide (BUMEX) 2 MG  tablet Take 1 tablet (2 mg total) by mouth 2 (two) times daily. Take an extra tablet for weight gain of 3 lbs or greater per day. 07/15/23  Yes Flossie Dibble, NP  carvedilol (COREG) 3.125 MG tablet Take 1 tablet (3.125 mg total) by mouth 2 (two) times daily. 05/20/23 08/18/23 Yes Flossie Dibble, NP  cloNIDine (CATAPRES) 0.3 MG tablet Take 1 tablet (0.3 mg total) by mouth at bedtime. 07/14/23  Yes Flossie Dibble, NP  clopidogrel (PLAVIX) 75 MG tablet Take 1 tablet (75 mg total) by mouth daily. 11/20/20  Yes Revankar, Aundra Dubin, MD  cyclobenzaprine (FLEXERIL) 10 MG tablet Take 10 mg by mouth 3 (three) times daily as needed for muscle spasms.   Yes [provider]  gabapentin (NEURONTIN) 600 MG tablet Take 600 mg by mouth 2 (two) times daily.   Yes [provider]  Insulin Glargine (BASAGLAR KWIKPEN) 100 UNIT/ML Inject 7 Units into the skin at bedtime. 07/18/23 07/17/24 Yes [provider]  isosorbide mononitrate (IMDUR) 120 MG 24 hr tablet Take 1 tablet by mouth once daily 08/11/23  Yes Revankar, Aundra Dubin, MD  latanoprost (XALATAN) 0.005 % ophthalmic solution Place 1 drop into both eyes at bedtime. 04/15/23  Yes [provider]  levothyroxine (SYNTHROID) 50 MCG tablet Take 50 mcg  by mouth daily. 04/05/23  Yes [provider]  nitroGLYCERIN (NITROSTAT) 0.4 MG SL tablet Place 1 tablet (0.4 mg total) under the tongue every 5 (five) minutes as needed for chest pain. 11/20/20  Yes Revankar, Aundra Dubin, MD  tamsulosin (FLOMAX) 0.4 MG CAPS capsule Take 0.4 mg by mouth daily after breakfast. 04/19/20  Yes [provider]  vitamin B-12 (CYANOCOBALAMIN) 500 MCG tablet Take 500 mcg by mouth daily.    Yes [provider]  Vitamin D, Ergocalciferol, (DRISDOL) 1.25 MG (50000 UNIT) CAPS capsule Take 50,000 Units by mouth once a week. Mondays 07/13/21  Yes [provider]  Blood Glucose Monitoring Suppl (TGT BLOOD GLUCOSE MONITORING) w/Device KIT 3  (three) times daily. 09/20/21   [provider]  Lancets MISC 1 each by miscellaneous route 2 (two) times a day. 09/20/21   [provider]     Family History  Problem Relation Age of Onset   Hyperlipidemia Mother    Hypertension Mother    Diabetes Mother    Heart attack Brother    Heart disease Brother    Lung disease Father     Social History   Socioeconomic History   Marital status: Married    Spouse name: Not on file   Number of children: Not on file   Years of education: Not on file   Highest education level: Not on file  Occupational History   Not on file  Tobacco Use   Smoking status: Former    Current packs/day: 0.00    Average packs/day: 0.5 packs/day for 25.0 years (12.5 ttl pk-yrs)    Types: Cigarettes    Start date: 15    Quit date: 2003    Years since quitting: 22.2   Smokeless tobacco: Never  Vaping Use   Vaping status: Never Used  Substance and Sexual Activity   Alcohol use: Not Currently    Comment: 2003   Drug use: Not Currently    Types: Marijuana   Sexual activity: Not on file  Other Topics Concern   Not on file  Social History Narrative   Not on file   Social Drivers of Health   Financial Resource Strain: Low Risk  (09/26/2020)   Received from [facility], [facility]   Overall Financial Resource Strain (CARDIA)    Difficulty of Paying Living Expenses: Not hard at all  Food Insecurity: No Food Insecurity (08/12/2023)   Hunger Vital Sign    Worried About Running Out of Food in the Last Year: Never true    Ran Out of Food in the Last Year: Never true  Transportation Needs: No Transportation Needs (08/12/2023)   PRAPARE - Administrator, Civil Service (Medical): No    Lack of Transportation (Non-Medical): No  Physical Activity: Inactive (09/26/2020)   Received from [facility], [facility]   Exercise Vital Sign    Days of Exercise per Week: 0 days    Minutes of Exercise per Session: 0 min   Stress: No Stress Concern Present (09/26/2020)   Received from [facility], [facility] of [facility] - Occupational Stress Questionnaire    Feeling of Stress : Not at all  Social Connections: Moderately Integrated (08/12/2023)   Social Connection and Isolation Panel [NHANES]    Frequency of Communication with Friends and Family: More than three times a week    Frequency of Social Gatherings with Friends and Family: Once a week  Attends Religious Services: More than 4 times per year    Active Member of Clubs or Organizations: No    Attends Banker Meetings: Never    Marital Status: Married       Review of Systems see above; denies fever, headache, chest pain, cough, abdominal/back pain, nausea, vomiting or bleeding  Vital Signs: BP 123/71 (BP Location: Right Arm)   Pulse 70   Temp 98.6 F (37 C) (Oral)   Resp 17   Ht 6\' 3"  (1.905 m)   Wt 217 lb 9.5 oz (98.7 kg)   SpO2 100%   BMI 27.20 kg/m   Advance Care Plan: No documents on file  Physical Exam: Awake, alert.  Chest clear to auscultation bilaterally.  Heart with normal rate, occasional ectopy, positive murmur.  Abdomen soft, positive bowel sounds, nontender.  2-3+ bilateral pretibial edema  Imaging: DG Chest 2 View Result Date: 08/11/2023 CLINICAL DATA:  Dyspnea EXAM: CHEST - 2 VIEW COMPARISON:  11/06/2020 FINDINGS: Lung volumes are small, but are symmetric and are clear. No pneumothorax or pleural effusion. Coronary artery bypass grafting has been performed. Cardiac size is at the upper limits of normal. Rim vascularity is normal. No acute bone abnormality. IMPRESSION: 1. Pulmonary hypoinflation.  Borderline cardiomegaly. Electronically Signed   By: Helyn Numbers M.D.   On: 08/11/2023 21:44   CARDIAC CATHETERIZATION Result Date: 08/01/2023   Origin to Prox Graft lesion is 100% stenosed.   Origin to Prox Graft lesion is 100% stenosed.   Origin to Prox Graft lesion is 100%  stenosed.   Ost LAD to Prox LAD lesion is 100% stenosed.   Ost RCA to Prox RCA lesion is 100% stenosed.   Mid Cx lesion is 99% stenosed. 1.  Severe native vessel disease. 2.  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  These presumably go to the diagonal, PDA, and obtuse marginal systems. 3.  Widely patent LIMA to LAD. 4.  Fick cardiac output of 6.9 L/min and Fick cardiac index of 3.0 L/min/m with the following hemodynamics:  Right atrial pressure mean of 16 mmHg  Right ventricular pressure 74/-1 with an end-diastolic pressure of 18 mmHg  PA pressure of 66/21 with a mean of 39 mmHg  Wedge pressure mean of 28 mmHg with V waves to 33 mmHg  PVR 1.6 Wood units  PA pulsatility index of 2.5 5.  Total dye expenditure of 40 cc Recommendation: Continue evaluation for potential aortic valve intervention.    Labs:  CBC: Recent Labs    08/13/23 0224 08/14/23 0217 08/15/23 0235 08/16/23 0324  WBC 5.3 5.2 5.0 5.9  HGB 8.5* 8.3* 7.9* 9.0*  HCT 26.6* 25.1* 24.2* 27.5*  PLT 108* 91* 91* 104*    COAGS: No results for input(s): "INR", "APTT" in the last 8760 hours.  BMP: Recent Labs    08/13/23 0224 08/14/23 0217 08/15/23 0235 08/16/23 0324  NA 140 139 139 140  K 5.2* 4.8 4.3 4.2  CL 110 106 105 104  CO2 19* 19* 21* 21*  GLUCOSE 127* 114* 142* 129*  BUN 130* 128* 135* 143*  CALCIUM 7.7* 7.6* 7.6* 8.1*  CREATININE 5.95* 6.07* 5.98* 6.09*  GFRNONAA 10* 9* 10* 9*    LIVER FUNCTION TESTS: Recent Labs    08/13/23 0224 08/14/23 0217 08/15/23 0235  ALBUMIN 3.3* 3.2* 3.0*    TUMOR MARKERS: No results for input(s): "AFPTM", "CEA", "CA199", "CHROMGRNA" in the last 8760 hours.  Assessment and Plan: 68 y.o. male with  past medical history significant for anemia, aortic stenosis, asthma, hypertension, BPH, bilateral lower extremity edema, coronary artery disease with prior MI/CABG, CHF, GERD, hyperlipidemia, hypothyroidism, peripheral vascular disease, diabetes, pulmonary hypertension,  sleep apnea, now with worsening dyspnea on exertion and lower extremity edema , creat 6.09 c/w progressive chronic kidney disease.  Request now received from nephrology for tunneled hemodialysis catheter placement.  Known to IR team from pericolonic abscess drainage in 2020.  Details/risks of procedure, including but not limited to, internal bleeding, infection, injury to adjacent structures discussed with patient with his understanding and consent.  Procedure tentatively scheduled for 3/17.  Patient afebrile, WBC nl,hgb 9, plts 104k.    Thank you for allowing our service to participate in JACODY BENEKE 's care.  Electronically Signed: D. Jeananne Rama, PA-C   08/16/2023, 11:55 AM      I spent a total of 20 minutes    in face to face in clinical consultation, greater than 50% of which was counseling/coordinating care for image guided tunneled hemodialysis catheter placement

## 2023-08-16 NOTE — Progress Notes (Signed)
 PROGRESS NOTE    Phillip [name]  WUJ:[id number] DOB: 1955-12-24 DOA: 08/11/2023 PCP: Gordan Payment., MD  67/M with hypertension, type 2 diabetes mellitus, CKD 5, CAD/CABG, chronic systolic CHF, hypothyroidism presented to the ED with worsening shortness of breath and swelling, with orthopnea.  Followed by nephrology in [facility], has been reluctant to consider dialysis. -Also recently underwent right and left heart cath 2/28 for TAVR workup. -In the ED noted to be significantly volume overloaded, hemoglobin 9.8, creatinine 6.1, BNP 685, chest x-ray with hypoinflation -Admitted, started on high-dose diuretics, seen by cards and nephrology, initially refused dialysis  Subjective: -Feels better overall, breathing and swelling continues to improve  Assessment and Plan:  AKI on CKD 5, progressive -Baseline creatinine around 3-4, has been up to the 5 range recently, nephrology consulting, considered ESRD at this time -Remains on high-dose diuretics, 6.3 L negative -Finally after much back-and-forth has decided to move forward with starting HD, plan for tunneled HD catheter -Decreased gabapentin and Flexeril dose  Acute on chronic systolic CHF -Echo 1/25 at [facility] with EF 30-35% with wall motion abnormality, grade 2 DD, low-flow low gradient severe aortic stenosis, severe pulmonary hypertension -Complicated by CKD 5 -on high-dose IV Lasix, almost ESRD -Continue Coreg, Imdur  Severe aortic stenosis, low-flow low gradient -Noted on echo 1/25, seen by structural heart team and underwent right and left heart cath with plan to complete TAVR protocol following renal recovery -Follow-up with structural heart team after dialysis   CAD Peripheral vascular disease Patient with prior history history of four-vessel CABG back in 2003.  Patient had left heart cath 07/2023 which showed patent LIMA to LAD but all 3 vein grafts occluded. -Continue aspirin, Plavix, beta-blocker, statin    Essential hypertension Blood pressures currently maintained 138/76 to 146/81 -Continue Coreg, cut down clonidine and Imdur dose   Diabetes mellitus type 2, with long-term use of insulin Last available hemoglobin A1c was 7.2 when last checked on 12/13 -Continue home long-acting insulin -SSI   Pancytopenia Chronic.,  Stable monitor   Hyperlipidemia -Continue atorvastatin   Hypothyroidism TSH was noted to be 3.99 when checked on 07/01/2023. -Repeat TSH now is 7.9, check free T4/T3 in a.m. -Continue levothyroxine   BPH -Continue Flomax  DVT prophylaxis: Heparin  Advance Care Planning:   Code Status: Full Code   Consults: Cardiology and nephrology   Family Communication: None present, called and updated spouse Disposition: Pending stabilization of above issues   Consultants: Cards, nephrology   Procedures:   Antimicrobials:    Objective: Vitals:   08/16/23 0405 08/16/23 0449 08/16/23 0747 08/16/23 1124  BP: 131/69  136/72 123/71  Pulse:   68 70  Resp: 13 14  17   Temp: 98.7 F (37.1 C)  98.6 F (37 C)   TempSrc: Oral  Oral   SpO2: 98%   100%  Weight:  98.7 kg    Height:        Intake/Output Summary (Last 24 hours) at 08/16/2023 1215 Last data filed at 08/16/2023 1123 Gross per 24 hour  Intake 799.07 ml  Output 3375 ml  Net -2575.93 ml   Filed Weights   08/14/23 0516 08/15/23 0450 08/16/23 0449  Weight: 102.6 kg 99.9 kg 98.7 kg    Examination: Gen: Awake, Alert, Oriented X 3,  HEENT: no JVD Lungs: Good air movement bilaterally, CTAB CVS: S1S2/RRR Abd: soft, Non tender, non distended, BS present Extremities: 2+ edema  Psychiatry: Flat affect    Data Reviewed:   CBC:  Recent Labs  Lab 08/11/23 1933 08/13/23 0224 08/14/23 0217 08/15/23 0235 08/16/23 0324  WBC 5.7 5.3 5.2 5.0 5.9  NEUTROABS 4.4  --   --   --   --   HGB 9.8* 8.5* 8.3* 7.9* 9.0*  HCT 30.9* 26.6* 25.1* 24.2* 27.5*  MCV 94.2 92.4 91.6 90.6 91.4  PLT 115* 108* 91* 91* 104*    Basic Metabolic Panel: Recent Labs  Lab 08/11/23 1933 08/13/23 0224 08/14/23 0217 08/15/23 0235 08/16/23 0324  NA 141 140 139 139 140  K 5.0 5.2* 4.8 4.3 4.2  CL 108 110 106 105 104  CO2 21* 19* 19* 21* 21*  GLUCOSE 112* 127* 114* 142* 129*  BUN 128* 130* 128* 135* 143*  CREATININE 6.17* 5.95* 6.07* 5.98* 6.09*  CALCIUM 7.7* 7.7* 7.6* 7.6* 8.1*  PHOS  --  6.2* 6.1* 5.8*  --    GFR: Estimated Creatinine Clearance: 13.9 mL/min (A) (by C-G formula based on SCr of 6.09 mg/dL (H)). Liver Function Tests: Recent Labs  Lab 08/13/23 0224 08/14/23 0217 08/15/23 0235  ALBUMIN 3.3* 3.2* 3.0*   No results for input(s): "LIPASE", "AMYLASE" in the last 168 hours. No results for input(s): "AMMONIA" in the last 168 hours. Coagulation Profile: No results for input(s): "INR", "PROTIME" in the last 168 hours. Cardiac Enzymes: No results for input(s): "CKTOTAL", "CKMB", "CKMBINDEX", "TROPONINI" in the last 168 hours. BNP (last 3 results) Recent Labs    07/14/23 1412  PROBNP 11,269*   HbA1C: No results for input(s): "HGBA1C" in the last 72 hours. CBG: Recent Labs  Lab 08/15/23 1112 08/15/23 1559 08/15/23 2045 08/16/23 0548 08/16/23 1129  GLUCAP 185* 110* 153* 107* 125*   Lipid Profile: No results for input(s): "CHOL", "HDL", "LDLCALC", "TRIG", "CHOLHDL", "LDLDIRECT" in the last 72 hours. Thyroid Function Tests: Recent Labs    08/16/23 0324  FREET4 0.82    Anemia Panel: No results for input(s): "VITAMINB12", "FOLATE", "FERRITIN", "TIBC", "IRON", "RETICCTPCT" in the last 72 hours.  Urine analysis:    Component Value Date/Time   COLORURINE YELLOW 11/07/2020 0744   APPEARANCEUR CLEAR 11/07/2020 0744   LABSPEC 1.011 11/07/2020 0744   PHURINE 5.0 11/07/2020 0744   GLUCOSEU 50 (A) 11/07/2020 0744   HGBUR SMALL (A) 11/07/2020 0744   BILIRUBINUR NEGATIVE 11/07/2020 0744   KETONESUR NEGATIVE 11/07/2020 0744   PROTEINUR 100 (A) 11/07/2020 0744   NITRITE NEGATIVE  11/07/2020 0744   LEUKOCYTESUR NEGATIVE 11/07/2020 0744   Sepsis Labs: @LABRCNTIP (procalcitonin:4,lacticidven:4)  )No results found for this or any previous visit (from the past 240 hours).   Radiology Studies: No results found.    Scheduled Meds:  aspirin EC  81 mg Oral Daily   atorvastatin  20 mg Oral Daily   calcitRIOL  0.25 mcg Oral Daily   carvedilol  3.125 mg Oral BID   cloNIDine  0.2 mg Oral QHS   clopidogrel  75 mg Oral Daily   cyanocobalamin  500 mcg Oral Daily   [START ON 08/22/2023] darbepoetin (ARANESP) injection - NON-DIALYSIS  200 mcg Subcutaneous Q Fri-1800   gabapentin  300 mg Oral QHS   heparin injection (subcutaneous)  5,000 Units Subcutaneous Q8H   insulin aspart  0-6 Units Subcutaneous TID WC   insulin glargine  7 Units Subcutaneous QHS   isosorbide mononitrate  60 mg Oral Daily   latanoprost  1 drop Both Eyes QHS   levothyroxine  50 mcg Oral Daily   metolazone  5 mg Oral Daily   sevelamer carbonate  800  mg Oral TID WC   tamsulosin  0.4 mg Oral QPC breakfast   Continuous Infusions:  [START ON 08/18/2023]  ceFAZolin (ANCEF) IV     furosemide 120 mg (08/16/23 0655)     LOS: 4 days    Time spent:    Zannie Cove, MD [facility]   08/16/2023, 12:15 PM

## 2023-08-16 NOTE — Progress Notes (Signed)
 Mobility Specialist Progress Note:   08/16/23 1030  Mobility  Activity Ambulated with assistance in hallway  Level of Assistance Contact guard assist, steadying assist  Assistive Device Cane  Distance Ambulated (ft) 120 ft  Activity Response Tolerated well  Mobility Referral Yes  Mobility visit 1 Mobility  Mobility Specialist Start Time (ACUTE ONLY) 1030  Mobility Specialist Stop Time (ACUTE ONLY) 1043  Mobility Specialist Time Calculation (min) (ACUTE ONLY) 13 min   Pt agreeable to mobility session. Required only minG assist to ambulate with cane. Pt c/o bilat leg cramps, limiting distance this am. VSS on RA. Pt back in chair with all needs met.   Addison Lank Mobility Specialist Please contact via SecureChat or  Rehab office at [phone]

## 2023-08-16 NOTE — Progress Notes (Signed)
 Mobility Specialist Progress Note:   08/16/23 1500  Mobility  Activity Ambulated with assistance in hallway  Level of Assistance Contact guard assist, steadying assist  Assistive Device Cane  Distance Ambulated (ft) 200 ft  Activity Response Tolerated well  Mobility Referral Yes  Mobility visit 1 Mobility  Mobility Specialist Start Time (ACUTE ONLY) 1500  Mobility Specialist Stop Time (ACUTE ONLY) 1520  Mobility Specialist Time Calculation (min) (ACUTE ONLY) 20 min   Pt agreeable to mobility session. Required minG assist for safety with ambulation. Continues to c/o BLE "tightness". Pt back in chair with all needs met.  Addison Lank Mobility Specialist Please contact via SecureChat or  Rehab office at [phone]

## 2023-08-17 ENCOUNTER — Other Ambulatory Visit: Payer: Self-pay | Admitting: Interventional Radiology

## 2023-08-17 DIAGNOSIS — I5020 Unspecified systolic (congestive) heart failure: Secondary | ICD-10-CM | POA: Diagnosis not present

## 2023-08-17 LAB — GLUCOSE, CAPILLARY
Glucose-Capillary: 109 mg/dL — ABNORMAL HIGH (ref 70–99)
Glucose-Capillary: 113 mg/dL — ABNORMAL HIGH (ref 70–99)
Glucose-Capillary: 163 mg/dL — ABNORMAL HIGH (ref 70–99)
Glucose-Capillary: 205 mg/dL — ABNORMAL HIGH (ref 70–99)

## 2023-08-17 LAB — BASIC METABOLIC PANEL
Anion gap: 13 (ref 5–15)
BUN: 142 mg/dL — ABNORMAL HIGH (ref 8–23)
CO2: 23 mmol/L (ref 22–32)
Calcium: 8.1 mg/dL — ABNORMAL LOW (ref 8.9–10.3)
Chloride: 104 mmol/L (ref 98–111)
Creatinine, Ser: 6.05 mg/dL — ABNORMAL HIGH (ref 0.61–1.24)
GFR, Estimated: 9 mL/min — ABNORMAL LOW (ref >60)
Glucose, Bld: 134 mg/dL — ABNORMAL HIGH (ref 70–99)
Potassium: 4.1 mmol/L (ref 3.5–5.1)
Sodium: 140 mmol/L (ref 135–145)

## 2023-08-17 LAB — PROTIME-INR
INR: 1.4 — ABNORMAL HIGH (ref 0.8–1.2)
Prothrombin Time: 17.1 s — ABNORMAL HIGH (ref 11.4–15.2)

## 2023-08-17 NOTE — Progress Notes (Signed)
 Mobility Specialist Progress Note:   08/17/23 1030  Mobility  Activity Ambulated with assistance in hallway  Level of Assistance Contact guard assist, steadying assist  Assistive Device Cane  Distance Ambulated (ft) 120 ft  Activity Response Tolerated well  Mobility Referral Yes  Mobility visit 1 Mobility  Mobility Specialist Start Time (ACUTE ONLY) 1030  Mobility Specialist Stop Time (ACUTE ONLY) 1045  Mobility Specialist Time Calculation (min) (ACUTE ONLY) 15 min   Pt agreeable to mobility session. Required contact assist for safety d/t minor unsteadiness today. No c/o throughout, pt back in chair with all needs met.   Addison Lank Mobility Specialist Please contact via SecureChat or  Rehab office at [phone]

## 2023-08-17 NOTE — Plan of Care (Signed)

## 2023-08-17 NOTE — Progress Notes (Signed)
 PROGRESS NOTE    Phillip [name]  ZOX:[id number] DOB: [birth year] DOA: 08/11/2023 PCP: Gordan Payment., MD  67/M with hypertension, type 2 diabetes mellitus, CKD 5, CAD/CABG, chronic systolic CHF, hypothyroidism presented to the ED with worsening shortness of breath and swelling, with orthopnea.  Followed by nephrology in [facility], has been reluctant to consider dialysis. -Also recently underwent right and left heart cath 2/28 for TAVR workup. -In the ED noted to be significantly volume overloaded, hemoglobin 9.8, creatinine 6.1, BNP 685, chest x-ray with hypoinflation -Admitted, started on high-dose diuretics, seen by cards and nephrology, initially refused dialysis, now amenable  Subjective: -Feels better overall, breathing continues to improve,  Assessment and Plan:  AKI on CKD 5, progressive -Baseline creatinine around 3-4, has been up to the 5 range recently, nephrology consulting, considered ESRD at this time -Remains on high-dose diuretics, 6.3 L negative -Finally after much back-and-forth has decided to move forward with starting HD, plan for tunneled HD catheter tomorrow -Decreased gabapentin and Flexeril dose  Acute on chronic systolic CHF -Echo 1/25 at [facility] with EF 30-35% with wall motion abnormality, grade 2 DD, low-flow low gradient severe aortic stenosis, severe pulmonary hypertension -Complicated by CKD 5 -on high-dose IV Lasix, almost ESRD -Continue Coreg, Imdur  Severe aortic stenosis, low-flow low gradient -Noted on echo 1/25, seen by structural heart team and underwent right and left heart cath with plan to complete TAVR protocol following renal recovery -Follow-up with structural heart team after dialysis   CAD Peripheral vascular disease Patient with prior history history of four-vessel CABG back in 2003.  Patient had left heart cath 07/2023 which showed patent LIMA to LAD but all 3 vein grafts occluded. -Continue aspirin, Plavix, beta-blocker,  statin   Essential hypertension Blood pressures currently maintained 138/76 to 146/81 -Continue Coreg, cut down clonidine and Imdur dose   Diabetes mellitus type 2, with long-term use of insulin Last available hemoglobin A1c was 7.2 when last checked on 12/13 -Continue home long-acting insulin -SSI   Pancytopenia Chronic.,  Stable monitor   Hyperlipidemia -Continue atorvastatin   Hypothyroidism TSH was noted to be 3.99 when checked on 07/01/2023. -Repeat TSH now is 7.9, check free T4/T3 in a.m. -Continue levothyroxine   BPH -Continue Flomax  DVT prophylaxis: Heparin  Advance Care Planning:   Code Status: Full Code   Consults: Cardiology and nephrology   Family Communication: None present, called and updated spouse yesterday Disposition: Pending stabilization of above issues   Consultants: Cards, nephrology   Procedures:   Antimicrobials:    Objective: Vitals:   08/17/23 0008 08/17/23 0451 08/17/23 0735 08/17/23 1115  BP: (!) 140/67 127/76 127/66 128/74  Pulse:   73 73  Resp: 18 18 18 18   Temp: 98.6 F (37 C) 98.7 F (37.1 C) 98.5 F (36.9 C) 98.2 F (36.8 C)  TempSrc: Oral Oral Oral Oral  SpO2: 100% 97% 96% 100%  Weight:  95.7 kg    Height:        Intake/Output Summary (Last 24 hours) at 08/17/2023 1132 Last data filed at 08/17/2023 0900 Gross per 24 hour  Intake 720 ml  Output 1475 ml  Net -755 ml   Filed Weights   08/15/23 0450 08/16/23 0449 08/17/23 0451  Weight: 99.9 kg 98.7 kg 95.7 kg    Examination:  Gen: Awake, Alert, Oriented X 3,  HEENT: +JVD Lungs: Good air movement bilaterally, CTAB CVS: S1S2/RRR Abd: soft, Non tender, non distended, BS present Extremities: 2+ edema Skin: no new  rashes on exposed skin     Data Reviewed:   CBC: Recent Labs  Lab 08/11/23 1933 08/13/23 0224 08/14/23 0217 08/15/23 0235 08/16/23 0324  WBC 5.7 5.3 5.2 5.0 5.9  NEUTROABS 4.4  --   --   --   --   HGB 9.8* 8.5* 8.3* 7.9* 9.0*  HCT 30.9*  26.6* 25.1* 24.2* 27.5*  MCV 94.2 92.4 91.6 90.6 91.4  PLT 115* 108* 91* 91* 104*   Basic Metabolic Panel: Recent Labs  Lab 08/13/23 0224 08/14/23 0217 08/15/23 0235 08/16/23 0324 08/17/23 0228  NA 140 139 139 140 140  K 5.2* 4.8 4.3 4.2 4.1  CL 110 106 105 104 104  CO2 19* 19* 21* 21* 23  GLUCOSE 127* 114* 142* 129* 134*  BUN 130* 128* 135* 143* 142*  CREATININE 5.95* 6.07* 5.98* 6.09* 6.05*  CALCIUM 7.7* 7.6* 7.6* 8.1* 8.1*  PHOS 6.2* 6.1* 5.8*  --   --    GFR: Estimated Creatinine Clearance: 14 mL/min (A) (by C-G formula based on SCr of 6.05 mg/dL (H)). Liver Function Tests: Recent Labs  Lab 08/13/23 0224 08/14/23 0217 08/15/23 0235  ALBUMIN 3.3* 3.2* 3.0*   No results for input(s): "LIPASE", "AMYLASE" in the last 168 hours. No results for input(s): "AMMONIA" in the last 168 hours. Coagulation Profile: Recent Labs  Lab 08/17/23 0228  INR 1.4*   Cardiac Enzymes: No results for input(s): "CKTOTAL", "CKMB", "CKMBINDEX", "TROPONINI" in the last 168 hours. BNP (last 3 results) Recent Labs    07/14/23 1412  PROBNP 11,269*   HbA1C: No results for input(s): "HGBA1C" in the last 72 hours. CBG: Recent Labs  Lab 08/16/23 1129 08/16/23 1703 08/16/23 2057 08/17/23 0624 08/17/23 1113  GLUCAP 125* 162* 198* 109* 113*   Lipid Profile: No results for input(s): "CHOL", "HDL", "LDLCALC", "TRIG", "CHOLHDL", "LDLDIRECT" in the last 72 hours. Thyroid Function Tests: Recent Labs    08/16/23 0324  FREET4 0.82    Anemia Panel: No results for input(s): "VITAMINB12", "FOLATE", "FERRITIN", "TIBC", "IRON", "RETICCTPCT" in the last 72 hours.  Urine analysis:    Component Value Date/Time   COLORURINE YELLOW 11/07/2020 0744   APPEARANCEUR CLEAR 11/07/2020 0744   LABSPEC 1.011 11/07/2020 0744   PHURINE 5.0 11/07/2020 0744   GLUCOSEU 50 (A) 11/07/2020 0744   HGBUR SMALL (A) 11/07/2020 0744   BILIRUBINUR NEGATIVE 11/07/2020 0744   KETONESUR NEGATIVE 11/07/2020 0744    PROTEINUR 100 (A) 11/07/2020 0744   NITRITE NEGATIVE 11/07/2020 0744   LEUKOCYTESUR NEGATIVE 11/07/2020 0744   Sepsis Labs: @LABRCNTIP (procalcitonin:4,lacticidven:4)  )No results found for this or any previous visit (from the past 240 hours).   Radiology Studies: No results found.    Scheduled Meds:  aspirin EC  81 mg Oral Daily   atorvastatin  20 mg Oral Daily   calcitRIOL  0.25 mcg Oral Daily   carvedilol  3.125 mg Oral BID   Chlorhexidine Gluconate Cloth  6 each Topical Q0600   cloNIDine  0.1 mg Oral QHS   clopidogrel  75 mg Oral Daily   cyanocobalamin  500 mcg Oral Daily   [START ON 08/22/2023] darbepoetin (ARANESP) injection - NON-DIALYSIS  200 mcg Subcutaneous Q Fri-1800   gabapentin  300 mg Oral QHS   heparin injection (subcutaneous)  5,000 Units Subcutaneous Q8H   insulin aspart  0-6 Units Subcutaneous TID WC   insulin glargine  7 Units Subcutaneous QHS   isosorbide mononitrate  30 mg Oral Daily   latanoprost  1 drop Both Eyes  QHS   levothyroxine  50 mcg Oral Daily   metolazone  5 mg Oral Daily   sevelamer carbonate  800 mg Oral TID WC   tamsulosin  0.4 mg Oral QPC breakfast   Continuous Infusions:  [START ON 08/18/2023]  ceFAZolin (ANCEF) IV     furosemide 120 mg (08/17/23 0607)     LOS: 5 days    Time spent:    Zannie Cove, MD [facility]   08/17/2023, 11:32 AM

## 2023-08-17 NOTE — Consult Note (Signed)
 WOC consulted for weeping skin tear, discussed with bedside nursing. Nursing skin care order set is managing wound. No other needs.   Re consult if needed, will not follow at this time. Thanks  Omario Ander [employer], RN,CWOCN, CNS, CWON-AP [phone])

## 2023-08-17 NOTE — Progress Notes (Signed)
 [facility] NEPHROLOGY PROGRESS NOTE  Assessment/ Plan: Pt is a 68 y.o. yo male with HTN, DM, CKD 5 thought to be due to diabetic nephropathy followed by Dr. Janit Pagan at [facility] with a recent baseline creatinine level seems to be around 5, CHF with preserved EF, CAD status post CABG, presented with worsening dyspnea on exertion, lower extremity edema worsening for 3 to 4 weeks.    #Acute kidney injury on CKD 5 with fluid overload/CHF exacerbation: CKD thought to be due to diabetic nephropathy, follows with nephrologist in [facility].  This sounds like progressive CKD.  Urine output is increasing with current high-dose IV diuretics and metolazone.  continued current diuretics especially since he still has significant lower extremity edema.  He was not interested in dialysis at least this admit- therefore palliative care team was consulted to address goals of care. He has a cousin and friend on dialysis and they "look terrible"  I attempted to educate that not everyones experience is the same.  He now says he will do after much discussion-  will plan for [facility] and to start HD on Monday/tomorrow -  Starting on dialysis now is going to be the most efficient way to get him better faster-  has reluctantly accepted his fate.  Tomorrow will need [facility] consult for access and navigator involvement     #Acute on chronic CHF: Cardiology seeing,  diuretics.  Lasix 120 q 8 and metolazone 5 daily  will continue for now as well as UF with HD-  still has volume on board   #Hypertension/volume: Continue antihypertensives and volume management with diuretics.  Continue fluid restriction and low-sodium diet.   #Anemia of CKD: Iron saturation 16% therefore ordered IV iron and Aranesp.     #CKD-MBD/hyperphosphatemia: PTH 332 - and started sevelamer as a phosphorus binders and low dose calcitriol.   Subjective: Seen and examined at bedside.  Urine output is recorded at least 1400. Negative 1 liter, weight  down   He denies nausea, vomiting, dysphagia, chest pain or shortness of breath.  Has reluctantly accepted his fate.  I talked to his wife as well over the phone  Vitals:   08/16/23 2047 08/17/23 0008 08/17/23 0451 08/17/23 0735  BP: 131/78 (!) 140/67 127/76 127/66  Pulse: 78   73  Resp: 18 18 18 18   Temp: 98.9 F (37.2 C) 98.6 F (37 C) 98.7 F (37.1 C) 98.5 F (36.9 C)  TempSrc: Oral Oral Oral Oral  SpO2: 100% 100% 97% 96%  Weight:   95.7 kg   Height:       Weight change: -3 kg  Intake/Output Summary (Last 24 hours) at 08/17/2023 0839 Last data filed at 08/17/2023 0800 Gross per 24 hour  Intake 600 ml  Output 1775 ml  Net -1175 ml       Labs: RENAL PANEL Recent Labs  Lab 08/13/23 0224 08/14/23 0217 08/15/23 0235 08/16/23 0324 08/17/23 0228  NA 140 139 139 140 140  K 5.2* 4.8 4.3 4.2 4.1  CL 110 106 105 104 104  CO2 19* 19* 21* 21* 23  GLUCOSE 127* 114* 142* 129* 134*  BUN 130* 128* 135* 143* 142*  CREATININE 5.95* 6.07* 5.98* 6.09* 6.05*  CALCIUM 7.7* 7.6* 7.6* 8.1* 8.1*  PHOS 6.2* 6.1* 5.8*  --   --   ALBUMIN 3.3* 3.2* 3.0*  --   --     Liver Function Tests: Recent Labs  Lab 08/13/23 0224 08/14/23 0217 08/15/23 0235  ALBUMIN  3.3* 3.2* 3.0*   No results for input(s): "LIPASE", "AMYLASE" in the last 168 hours. No results for input(s): "AMMONIA" in the last 168 hours. CBC: Recent Labs    08/11/23 1933 08/13/23 0224 08/14/23 0217 08/15/23 0235 08/16/23 0324  HGB 9.8* 8.5* 8.3* 7.9* 9.0*  MCV 94.2 92.4 91.6 90.6 91.4  FERRITIN  --  140  --   --   --   TIBC  --  308  --   --   --   IRON  --  50  --   --   --     Cardiac Enzymes: No results for input(s): "CKTOTAL", "CKMB", "CKMBINDEX", "TROPONINI" in the last 168 hours. CBG: Recent Labs  Lab 08/16/23 0548 08/16/23 1129 08/16/23 1703 08/16/23 2057 08/17/23 0624  GLUCAP 107* 125* 162* 198* 109*    Iron Studies:  No results for input(s): "IRON", "TIBC", "TRANSFERRIN", "FERRITIN" in the  last 72 hours.  Studies/Results: No results found.  Medications: Infusions:  [START ON 08/18/2023]  ceFAZolin (ANCEF) IV     furosemide 120 mg (08/17/23 7829)    Scheduled Medications:  aspirin EC  81 mg Oral Daily   atorvastatin  20 mg Oral Daily   calcitRIOL  0.25 mcg Oral Daily   carvedilol  3.125 mg Oral BID   Chlorhexidine Gluconate Cloth  6 each Topical Q0600   cloNIDine  0.1 mg Oral QHS   clopidogrel  75 mg Oral Daily   cyanocobalamin  500 mcg Oral Daily   [START ON 08/22/2023] darbepoetin (ARANESP) injection - NON-DIALYSIS  200 mcg Subcutaneous Q Fri-1800   gabapentin  300 mg Oral QHS   heparin injection (subcutaneous)  5,000 Units Subcutaneous Q8H   insulin aspart  0-6 Units Subcutaneous TID WC   insulin glargine  7 Units Subcutaneous QHS   isosorbide mononitrate  30 mg Oral Daily   latanoprost  1 drop Both Eyes QHS   levothyroxine  50 mcg Oral Daily   metolazone  5 mg Oral Daily   sevelamer carbonate  800 mg Oral TID WC   tamsulosin  0.4 mg Oral QPC breakfast    have reviewed scheduled and prn medications.  Physical Exam: General:NAD, comfortable Heart:RRR, s1s2 nl Lungs:clear b/l, no crackle Abdomen:soft, Non-tender, non-distended Extremities: Bilateral lower extremities edema++ Neurology: Alert, awake and following commands. Making jokes  Cecille Aver 08/17/2023,8:39 AM  LOS: 5 days

## 2023-08-18 ENCOUNTER — Inpatient Hospital Stay (HOSPITAL_COMMUNITY)

## 2023-08-18 DIAGNOSIS — N186 End stage renal disease: Secondary | ICD-10-CM

## 2023-08-18 DIAGNOSIS — N185 Chronic kidney disease, stage 5: Secondary | ICD-10-CM

## 2023-08-18 DIAGNOSIS — I5020 Unspecified systolic (congestive) heart failure: Secondary | ICD-10-CM | POA: Diagnosis not present

## 2023-08-18 HISTORY — PX: IR US GUIDE VASC ACCESS RIGHT: IMG2390

## 2023-08-18 HISTORY — PX: IR FLUORO GUIDE CV LINE RIGHT: IMG2283

## 2023-08-18 LAB — BASIC METABOLIC PANEL
Anion gap: 13 (ref 5–15)
BUN: 145 mg/dL — ABNORMAL HIGH (ref 8–23)
CO2: 24 mmol/L (ref 22–32)
Calcium: 8 mg/dL — ABNORMAL LOW (ref 8.9–10.3)
Chloride: 101 mmol/L (ref 98–111)
Creatinine, Ser: 6.07 mg/dL — ABNORMAL HIGH (ref 0.61–1.24)
GFR, Estimated: 9 mL/min — ABNORMAL LOW (ref >60)
Glucose, Bld: 128 mg/dL — ABNORMAL HIGH (ref 70–99)
Potassium: 3.6 mmol/L (ref 3.5–5.1)
Sodium: 138 mmol/L (ref 135–145)

## 2023-08-18 LAB — GLUCOSE, CAPILLARY
Glucose-Capillary: 121 mg/dL — ABNORMAL HIGH (ref 70–99)
Glucose-Capillary: 173 mg/dL — ABNORMAL HIGH (ref 70–99)
Glucose-Capillary: 243 mg/dL — ABNORMAL HIGH (ref 70–99)
Glucose-Capillary: 262 mg/dL — ABNORMAL HIGH (ref 70–99)

## 2023-08-18 LAB — T3, FREE: T3, Free: 1.7 pg/mL — ABNORMAL LOW (ref 2.0–4.4)

## 2023-08-18 MED ORDER — HEPARIN SODIUM (PORCINE) 1000 UNIT/ML IJ SOLN
INTRAMUSCULAR | Status: AC
  Filled 2023-08-18: qty 10

## 2023-08-18 MED ORDER — FENTANYL CITRATE (PF) 100 MCG/2ML IJ SOLN
INTRAMUSCULAR | Status: AC | PRN
  Administered 2023-08-18: 50 ug via INTRAVENOUS

## 2023-08-18 MED ORDER — MIDAZOLAM HCL 2 MG/2ML IJ SOLN
INTRAMUSCULAR | Status: AC | PRN
  Administered 2023-08-18: 1 mg via INTRAVENOUS

## 2023-08-18 MED ORDER — CEFAZOLIN SODIUM-DEXTROSE 2-4 GM/100ML-% IV SOLN
INTRAVENOUS | Status: AC | PRN
  Administered 2023-08-18: 2 g via INTRAVENOUS

## 2023-08-18 MED ORDER — LIDOCAINE-EPINEPHRINE 1 %-1:100000 IJ SOLN
INTRAMUSCULAR | Status: AC
  Filled 2023-08-18: qty 1

## 2023-08-18 MED ORDER — HEPARIN SODIUM (PORCINE) 1000 UNIT/ML IJ SOLN
4000.0000 [IU] | Freq: Once | INTRAMUSCULAR | Status: AC
  Administered 2023-08-18: 3.2 mL via INTRAVENOUS

## 2023-08-18 MED ORDER — FENTANYL CITRATE (PF) 100 MCG/2ML IJ SOLN
INTRAMUSCULAR | Status: AC
  Filled 2023-08-18: qty 2

## 2023-08-18 MED ORDER — LIDOCAINE HCL (PF) 1 % IJ SOLN
30.0000 mL | Freq: Once | INTRAMUSCULAR | Status: AC
  Administered 2023-08-18: 10 mL

## 2023-08-18 MED ORDER — MIDAZOLAM HCL 2 MG/2ML IJ SOLN
INTRAMUSCULAR | Status: AC
  Filled 2023-08-18: qty 2

## 2023-08-18 MED ORDER — CEFAZOLIN SODIUM-DEXTROSE 2-4 GM/100ML-% IV SOLN
INTRAVENOUS | Status: AC
  Filled 2023-08-18: qty 100

## 2023-08-18 NOTE — Progress Notes (Signed)
 Requested to see pt for out-pt HD needs at d/c. Met with pt and family at bedside. Introduced self and explained role. Discussed out-pt HD options. Pt prefers clinic close to home. Referral submitted to [facility] admissions for [facility] since pt lives in [place]. Pt states he plans to drive self to appts at d/c. Will assist as needed.   Olivia Canter Renal Navigator [phone]

## 2023-08-18 NOTE — Progress Notes (Signed)
 PROGRESS NOTE    Phillip [name]  RCV:[id number] DOB: [birth year] DOA: 08/11/2023 PCP: Gordan Payment., MD  67/M with hypertension, type 2 diabetes mellitus, CKD 5, CAD/CABG, chronic systolic CHF, hypothyroidism presented to the ED with worsening shortness of breath and swelling, with orthopnea.  Followed by nephrology in [facility], has been reluctant to consider dialysis. -Also recently underwent right and left heart cath 2/28 for TAVR workup. -In the ED noted to be significantly volume overloaded, hemoglobin 9.8, creatinine 6.1, BNP 685, chest x-ray with hypoinflation -Admitted, started on high-dose diuretics, seen by cards and nephrology, initially refused dialysis, now amenable -3/17: Tunneled right IJ HD cath placed  Subjective: -Feels okay, just back from HD cath placement -Feels better overall, swelling improving  Assessment and Plan:  AKI on CKD 5, progressive -Baseline creatinine around 3-4, has been up to the 5 range recently, nephrology consulting, considered ESRD at this time -Remains on high-dose diuretics, 8.2 L negative -Finally after much back-and-forth has decided to move forward with starting HD, just underwent tunneled HD catheter placement -Plan to start HD today -Decreased gabapentin and Flexeril dose  Acute on chronic systolic CHF -Echo 1/25 at [facility] with EF 30-35% with wall motion abnormality, grade 2 DD, low-flow low gradient severe aortic stenosis, severe pulmonary hypertension -Complicated by CKD 5 -on high-dose IV Lasix, starting HD today -Continue Coreg, Imdur  Severe aortic stenosis, low-flow low gradient -Noted on echo 1/25, seen by structural heart team and underwent right and left heart cath with plan to complete TAVR protocol following renal recovery -Follow-up with structural heart team after dialysis   CAD Peripheral vascular disease Patient with prior history history of four-vessel CABG back in 2003.  Patient had left heart cath  07/2023 which showed patent LIMA to LAD but all 3 vein grafts occluded. -Continue aspirin, Plavix, beta-blocker, statin   Essential hypertension Blood pressures currently maintained 138/76 to 146/81 -Continue Coreg, cut down clonidine and Imdur dose   Diabetes mellitus type 2, with long-term use of insulin Last available hemoglobin A1c was 7.2 when last checked on 12/13 -Continue home long-acting insulin -SSI   Pancytopenia Chronic.,  Stable monitor   Hyperlipidemia -Continue atorvastatin   Hypothyroidism TSH was noted to be 3.99 when checked on 07/01/2023. -Repeat TSH now is 7.9, check free T4/T3 in a.m. -Continue levothyroxine   BPH -Continue Flomax  DVT prophylaxis: Heparin  Advance Care Planning:   Code Status: Full Code   Consults: Cardiology and nephrology   Family Communication: None present, called and updated spouse yesterday Disposition: Pending stabilization of above issues   Consultants: Cards, nephrology   Procedures:   Antimicrobials:    Objective: Vitals:   08/18/23 0825 08/18/23 0830 08/18/23 0838 08/18/23 0900  BP: 138/83 135/84 (!) 143/81 135/72  Pulse: 71 72 71 72  Resp: 15 20 11 14   Temp:    97.6 F (36.4 C)  TempSrc:    Oral  SpO2: 100% 100% 100%   Weight:      Height:        Intake/Output Summary (Last 24 hours) at 08/18/2023 1000 Last data filed at 08/18/2023 0000 Gross per 24 hour  Intake 857.03 ml  Output 1300 ml  Net -442.97 ml   Filed Weights   08/16/23 0449 08/17/23 0451 08/18/23 0516  Weight: 98.7 kg 95.7 kg 93.6 kg    Examination:  Gen: Awake, Alert, Oriented X 3, no distress HEENT: Positive JVD, right IJ HD cath CVS: S1-S2, regular rhythm Lungs: Decreased breath  sounds at the bases Abdomen: Soft, nontender, bowel sounds present Extremities: 1-2+ edema Skin: no new rashes on exposed skin     Data Reviewed:   CBC: Recent Labs  Lab 08/11/23 1933 08/13/23 0224 08/14/23 0217 08/15/23 0235 08/16/23 0324   WBC 5.7 5.3 5.2 5.0 5.9  NEUTROABS 4.4  --   --   --   --   HGB 9.8* 8.5* 8.3* 7.9* 9.0*  HCT 30.9* 26.6* 25.1* 24.2* 27.5*  MCV 94.2 92.4 91.6 90.6 91.4  PLT 115* 108* 91* 91* 104*   Basic Metabolic Panel: Recent Labs  Lab 08/13/23 0224 08/14/23 0217 08/15/23 0235 08/16/23 0324 08/17/23 0228 08/18/23 0301  NA 140 139 139 140 140 138  K 5.2* 4.8 4.3 4.2 4.1 3.6  CL 110 106 105 104 104 101  CO2 19* 19* 21* 21* 23 24  GLUCOSE 127* 114* 142* 129* 134* 128*  BUN 130* 128* 135* 143* 142* 145*  CREATININE 5.95* 6.07* 5.98* 6.09* 6.05* 6.07*  CALCIUM 7.7* 7.6* 7.6* 8.1* 8.1* 8.0*  PHOS 6.2* 6.1* 5.8*  --   --   --    GFR: Estimated Creatinine Clearance: 13.9 mL/min (A) (by C-G formula based on SCr of 6.07 mg/dL (H)). Liver Function Tests: Recent Labs  Lab 08/13/23 0224 08/14/23 0217 08/15/23 0235  ALBUMIN 3.3* 3.2* 3.0*   No results for input(s): "LIPASE", "AMYLASE" in the last 168 hours. No results for input(s): "AMMONIA" in the last 168 hours. Coagulation Profile: Recent Labs  Lab 08/17/23 0228  INR 1.4*   Cardiac Enzymes: No results for input(s): "CKTOTAL", "CKMB", "CKMBINDEX", "TROPONINI" in the last 168 hours. BNP (last 3 results) Recent Labs    07/14/23 1412  PROBNP 11,269*   HbA1C: No results for input(s): "HGBA1C" in the last 72 hours. CBG: Recent Labs  Lab 08/17/23 0624 08/17/23 1113 08/17/23 1536 08/17/23 2104 08/18/23 0532  GLUCAP 109* 113* 163* 205* 121*   Lipid Profile: No results for input(s): "CHOL", "HDL", "LDLCALC", "TRIG", "CHOLHDL", "LDLDIRECT" in the last 72 hours. Thyroid Function Tests: Recent Labs    08/16/23 0324  FREET4 0.82  T3FREE 1.7*    Anemia Panel: No results for input(s): "VITAMINB12", "FOLATE", "FERRITIN", "TIBC", "IRON", "RETICCTPCT" in the last 72 hours.  Urine analysis:    Component Value Date/Time   COLORURINE YELLOW 11/07/2020 0744   APPEARANCEUR CLEAR 11/07/2020 0744   LABSPEC 1.011 11/07/2020 0744    PHURINE 5.0 11/07/2020 0744   GLUCOSEU 50 (A) 11/07/2020 0744   HGBUR SMALL (A) 11/07/2020 0744   BILIRUBINUR NEGATIVE 11/07/2020 0744   KETONESUR NEGATIVE 11/07/2020 0744   PROTEINUR 100 (A) 11/07/2020 0744   NITRITE NEGATIVE 11/07/2020 0744   LEUKOCYTESUR NEGATIVE 11/07/2020 0744   Sepsis Labs: @LABRCNTIP (procalcitonin:4,lacticidven:4)  )No results found for this or any previous visit (from the past 240 hours).   Radiology Studies: IR Fluoro Guide CV Line Right Result Date: 08/18/2023 CLINICAL DATA:  Renal failure, needs durable venous access for planned hemodialysis EXAM: TUNNELED HEMODIALYSIS CATHETER PLACEMENT WITH ULTRASOUND AND FLUOROSCOPIC GUIDANCE TECHNIQUE: The procedure, risks, benefits, and alternatives were explained to the patient. Questions regarding the procedure were encouraged and answered. The patient understands and consents to the procedure. As antibiotic prophylaxis, cefazolin 2 g was ordered pre-procedure and administered intravenously within one hour of incision.Patency of the right IJ vein was confirmed with ultrasound with image documentation. An appropriate skin site was determined. Region was prepped using maximum barrier technique including cap and mask, sterile gown, sterile gloves, large sterile sheet,  and Chlorhexidine as cutaneous antisepsis. The region was infiltrated locally with 1% lidocaine. Intravenous Fentanyl and Versed 1mg  were administered by RN during a total moderate (conscious) sedation time of 10 minutes; the patient's level of consciousness and physiological / cardiorespiratory status were monitored continuously by radiology RN under my direct supervision. Under real-time ultrasound guidance, the right IJ vein was accessed with a 21 gauge micropuncture needle; the needle tip within the vein was confirmed with ultrasound image documentation. Needle exchanged over the 018 guidewire for transitional dilator, which allowed advancement of a Benson wire  into the IVC. Over this, an MPA catheter was advanced. A Palindrome 19 hemodialysis catheter was tunneled from the right anterior chest wall approach to the right IJ dermatotomy site. The MPA catheter was exchanged over an Amplatz wire for serial vascular dilators which allow placement of a peel-away sheath, through which the catheter was advanced under intermittent fluoroscopy, positioned with its tips in the proximal and midright atrium. Spot chest radiograph confirms good catheter position. No pneumothorax. Catheter was flushed and primed per protocol. Catheter secured externally with O Prolene sutures. The right IJ dermatotomy site was closed with Dermabond. COMPLICATIONS: COMPLICATIONS None immediate FLUOROSCOPY: Radiation Exposure Index (as provided by the fluoroscopic device): 5 mGy air Kerma COMPARISON:  None Available. IMPRESSION: 1. Technically successful placement of tunneled right IJ hemodialysis catheter with ultrasound and fluoroscopic guidance. Ready for routine use. ACCESS: Remains approachable for percutaneous intervention as needed. Electronically Signed   By: Corlis Leak M.D.   On: 08/18/2023 09:53   IR US Guide Vasc Access Right Result Date: 08/18/2023 CLINICAL DATA:  Renal failure, needs durable venous access for planned hemodialysis EXAM: TUNNELED HEMODIALYSIS CATHETER PLACEMENT WITH ULTRASOUND AND FLUOROSCOPIC GUIDANCE TECHNIQUE: The procedure, risks, benefits, and alternatives were explained to the patient. Questions regarding the procedure were encouraged and answered. The patient understands and consents to the procedure. As antibiotic prophylaxis, cefazolin 2 g was ordered pre-procedure and administered intravenously within one hour of incision.Patency of the right IJ vein was confirmed with ultrasound with image documentation. An appropriate skin site was determined. Region was prepped using maximum barrier technique including cap and mask, sterile gown, sterile gloves, large sterile  sheet, and Chlorhexidine as cutaneous antisepsis. The region was infiltrated locally with 1% lidocaine. Intravenous Fentanyl and Versed 1mg  were administered by RN during a total moderate (conscious) sedation time of 10 minutes; the patient's level of consciousness and physiological / cardiorespiratory status were monitored continuously by radiology RN under my direct supervision. Under real-time ultrasound guidance, the right IJ vein was accessed with a 21 gauge micropuncture needle; the needle tip within the vein was confirmed with ultrasound image documentation. Needle exchanged over the 018 guidewire for transitional dilator, which allowed advancement of a Benson wire into the IVC. Over this, an MPA catheter was advanced. A Palindrome 19 hemodialysis catheter was tunneled from the right anterior chest wall approach to the right IJ dermatotomy site. The MPA catheter was exchanged over an Amplatz wire for serial vascular dilators which allow placement of a peel-away sheath, through which the catheter was advanced under intermittent fluoroscopy, positioned with its tips in the proximal and midright atrium. Spot chest radiograph confirms good catheter position. No pneumothorax. Catheter was flushed and primed per protocol. Catheter secured externally with O Prolene sutures. The right IJ dermatotomy site was closed with Dermabond. COMPLICATIONS: COMPLICATIONS None immediate FLUOROSCOPY: Radiation Exposure Index (as provided by the fluoroscopic device): 5 mGy air Kerma COMPARISON:  None Available. IMPRESSION: 1.  Technically successful placement of tunneled right IJ hemodialysis catheter with ultrasound and fluoroscopic guidance. Ready for routine use. ACCESS: Remains approachable for percutaneous intervention as needed. Electronically Signed   By: Corlis Leak M.D.   On: 08/18/2023 09:53      Scheduled Meds:  aspirin EC  81 mg Oral Daily   atorvastatin  20 mg Oral Daily   calcitRIOL  0.25 mcg Oral Daily    carvedilol  3.125 mg Oral BID   Chlorhexidine Gluconate Cloth  6 each Topical Q0600   cloNIDine  0.1 mg Oral QHS   clopidogrel  75 mg Oral Daily   cyanocobalamin  500 mcg Oral Daily   [START ON 08/22/2023] darbepoetin (ARANESP) injection - NON-DIALYSIS  200 mcg Subcutaneous Q Fri-1800   gabapentin  300 mg Oral QHS   heparin injection (subcutaneous)  5,000 Units Subcutaneous Q8H   insulin aspart  0-6 Units Subcutaneous TID WC   insulin glargine  7 Units Subcutaneous QHS   isosorbide mononitrate  30 mg Oral Daily   latanoprost  1 drop Both Eyes QHS   levothyroxine  50 mcg Oral Daily   metolazone  5 mg Oral Daily   sevelamer carbonate  800 mg Oral TID WC   tamsulosin  0.4 mg Oral QPC breakfast   Continuous Infusions:   ceFAZolin (ANCEF) IV     furosemide 120 mg (08/18/23 0541)     LOS: 6 days    Time spent:    Zannie Cove, MD [facility]   08/18/2023, 10:00 AM

## 2023-08-18 NOTE — Progress Notes (Signed)
 Phillip [name]  Assessment/ Plan: Pt is a 68 y.o. yo male with HTN, DM, CKD 5 thought to be due to diabetic nephropathy followed by Dr. Janit Pagan at [facility] with a recent baseline creatinine level seems to be around 5, CHF with preserved EF, CAD status post CABG, presented with worsening dyspnea on exertion, lower extremity edema worsening for 3 to 4 weeks.    #Acute kidney injury on CKD 5 with fluid overload/CHF exacerbation: CKD thought to be due to diabetic nephropathy, follows with nephrologist in [facility].  This sounds like progressive CKD.   Urine output is increasing with current high-dose IV diuretics and metolazone.  continued current diuretics especially since he still has significant lower extremity edema.  He was not interested in dialysis at least this admit- therefore palliative care team was consulted to address goals of care. He has a cousin and friend on dialysis and they "look terrible"; after continued education he was willing to start HD.  Planning for [facility] by VIR and to start HD today.  Will also consult [facility] for access and navigator involvement; CLIP.   #Acute on chronic CHF: Cardiology seeing,  diuretics.  Lasix 120 q 8 and metolazone 5 daily  will continue for now as well as UF with HD-  still has volume on board   #Hypertension/volume: Continue antihypertensives and volume management with diuretics.  Continue fluid restriction and low-sodium diet.   #Anemia of CKD: Iron saturation 16% therefore ordered IV iron Venofer 200 3/12-3/14) and Aranesp (120 3/14).     #CKD-MBD/hyperphosphatemia: PTH 332 - and started sevelamer as a phosphorus binders and low dose calcitriol. Recheck phos on wed.    Subjective: Seen and examined at bedside.  Urine output is recorded at 2200. Negative 8.3L during hsopitalization. He denies nausea, vomiting, dysphagia, chest pain or shortness of breath.  Has reluctantly accepted his fate.   Vitals:   08/17/23  1538 08/17/23 2040 08/18/23 0023 08/18/23 0516  BP: 129/73 131/72 126/67 127/69  Pulse: 73 80 77 73  Resp: 18 18 18 18   Temp: 98.3 F (36.8 C) 98.7 F (37.1 C) 98.7 F (37.1 C) 98.4 F (36.9 C)  TempSrc: Oral Oral Oral Oral  SpO2: 100% 100% 98% 98%  Weight:    93.6 kg  Height:       Weight change: -2.1 kg  Intake/Output Summary (Last 24 hours) at 08/18/2023 0721 Last data filed at 08/18/2023 0000 Gross per 24 hour  Intake 1217.03 ml  Output 2075 ml  Net -857.97 ml       Labs: RENAL PANEL Recent Labs  Lab 08/13/23 0224 08/14/23 0217 08/15/23 0235 08/16/23 0324 08/17/23 0228 08/18/23 0301  NA 140 139 139 140 140 138  K 5.2* 4.8 4.3 4.2 4.1 3.6  CL 110 106 105 104 104 101  CO2 19* 19* 21* 21* 23 24  GLUCOSE 127* 114* 142* 129* 134* 128*  BUN 130* 128* 135* 143* 142* 145*  CREATININE 5.95* 6.07* 5.98* 6.09* 6.05* 6.07*  CALCIUM 7.7* 7.6* 7.6* 8.1* 8.1* 8.0*  PHOS 6.2* 6.1* 5.8*  --   --   --   ALBUMIN 3.3* 3.2* 3.0*  --   --   --     Liver Function Tests: Recent Labs  Lab 08/13/23 0224 08/14/23 0217 08/15/23 0235  ALBUMIN 3.3* 3.2* 3.0*   No results for input(s): "LIPASE", "AMYLASE" in the last 168 hours. No results for input(s): "AMMONIA" in the last 168 hours. CBC: Recent  Labs    08/11/23 1933 08/13/23 0224 08/14/23 0217 08/15/23 0235 08/16/23 0324  HGB 9.8* 8.5* 8.3* 7.9* 9.0*  MCV 94.2 92.4 91.6 90.6 91.4  FERRITIN  --  140  --   --   --   TIBC  --  308  --   --   --   IRON  --  50  --   --   --     Cardiac Enzymes: No results for input(s): "CKTOTAL", "CKMB", "CKMBINDEX", "TROPONINI" in the last 168 hours. CBG: Recent Labs  Lab 08/17/23 0624 08/17/23 1113 08/17/23 1536 08/17/23 2104 08/18/23 0532  GLUCAP 109* 113* 163* 205* 121*    Iron Studies:  No results for input(s): "IRON", "TIBC", "TRANSFERRIN", "FERRITIN" in the last 72 hours.  Studies/Results: No results found.  Medications: Infusions:   ceFAZolin (ANCEF) IV      furosemide 120 mg (08/18/23 0541)    Scheduled Medications:  aspirin EC  81 mg Oral Daily   atorvastatin  20 mg Oral Daily   calcitRIOL  0.25 mcg Oral Daily   carvedilol  3.125 mg Oral BID   Chlorhexidine Gluconate Cloth  6 each Topical Q0600   cloNIDine  0.1 mg Oral QHS   clopidogrel  75 mg Oral Daily   cyanocobalamin  500 mcg Oral Daily   [START ON 08/22/2023] darbepoetin (ARANESP) injection - NON-DIALYSIS  200 mcg Subcutaneous Q Fri-1800   gabapentin  300 mg Oral QHS   heparin injection (subcutaneous)  5,000 Units Subcutaneous Q8H   insulin aspart  0-6 Units Subcutaneous TID WC   insulin glargine  7 Units Subcutaneous QHS   isosorbide mononitrate  30 mg Oral Daily   latanoprost  1 drop Both Eyes QHS   levothyroxine  50 mcg Oral Daily   metolazone  5 mg Oral Daily   sevelamer carbonate  800 mg Oral TID WC   tamsulosin  0.4 mg Oral QPC breakfast    have reviewed scheduled and prn medications.  Physical Exam: General:NAD, comfortable Heart:RRR, s1s2 nl Lungs:clear b/l, no crackle Abdomen:soft, Non-tender, non-distended Extremities: Bilateral lower extremities edema++ Neurology: Alert, awake and following commands. Making jokes  Miamarie Moll W 08/18/2023,7:21 AM  LOS: 6 days

## 2023-08-18 NOTE — Procedures (Signed)
  Procedure:  R internal jugular tunneled HD CVC Palindrome 19 to svc/ra jct Preprocedure diagnosis: The primary encounter diagnosis was Acute on chronic congestive heart failure, unspecified heart failure type (HCC). A diagnosis of AKI (acute kidney injury) (HCC) was also pertinent to this visit. Postprocedure diagnosis: same EBL:    minimal Complications:   none immediate  See full dictation in [employer].  Thora Lance MD Main # [phone] Pager  [phone] Mobile [phone]

## 2023-08-18 NOTE — Consult Note (Addendum)
 [name] NOTE   MRN : [id number]  Reason for Consult: ESRD Referring Physician: Dr. Juel Burrow Nephrology  History of Present Illness: 68 y/o male with history of hypertension, type 2 diabetes mellitus, CKD 5, CAD/CABG, chronic systolic CHF, hypothyroidism presented to the ED with worsening shortness of breath and swelling, with orthopnea.   He is now in ESRD and we have been asked to provide permanent access.  He is right hand dominant.  A TDC was placed by IR earlier today.  Of note he recently underwent right and left heart cath 2/28 for TAVR workup.      Current Facility-Administered Medications  Medication Dose Route Frequency Provider Last Rate Last Admin   acetaminophen (TYLENOL) tablet 650 mg  650 mg Oral Q6H PRN Clydie Braun, MD       Or   acetaminophen (TYLENOL) suppository 650 mg  650 mg Rectal Q6H PRN Katrinka Blazing, Rondell A, MD       albuterol (PROVENTIL) (2.5 MG/3ML) 0.083% nebulizer solution 2.5 mg  2.5 mg Nebulization Q6H PRN Madelyn Flavors A, MD       aspirin EC tablet 81 mg  81 mg Oral Daily Smith, Rondell A, MD   81 mg at 08/18/23 0946   atorvastatin (LIPITOR) tablet 20 mg  20 mg Oral Daily Katrinka Blazing, Rondell A, MD   20 mg at 08/18/23 9629   calcitRIOL (ROCALTROL) capsule 0.25 mcg  0.25 mcg Oral Daily Annie Sable, MD   0.25 mcg at 08/18/23 0949   carvedilol (COREG) tablet 3.125 mg  3.125 mg Oral BID Madelyn Flavors A, MD   3.125 mg at 08/18/23 0948   ceFAZolin (ANCEF) IVPB 2g/100 mL premix  2 g Intravenous to XRAY Allred, Darrell K, PA-C       Chlorhexidine Gluconate Cloth 2 % PADS 6 each  6 each Topical Q0600 Annie Sable, MD   6 each at 08/18/23 0535   clopidogrel (PLAVIX) tablet 75 mg  75 mg Oral Daily Smith, Rondell A, MD   75 mg at 08/18/23 0946   cyanocobalamin (VITAMIN B12) tablet 500 mcg  500 mcg Oral Daily Madelyn Flavors A, MD   500 mcg at 08/18/23 0946   cyclobenzaprine (FLEXERIL) tablet 5 mg  5 mg Oral TID PRN Zannie Cove, MD       [START ON 08/22/2023] Darbepoetin Alfa (ARANESP) injection 200 mcg  200 mcg Subcutaneous Q Fri-1800 Annie Sable, MD       furosemide (LASIX) 120 mg in dextrose 5 % 50 mL IVPB  120 mg Intravenous Q8H Maxie Barb, MD 62 mL/hr at 08/18/23 0541 120 mg at 08/18/23 0541   gabapentin (NEURONTIN) capsule 300 mg  300 mg Oral QHS Zannie Cove, MD   300 mg at 08/17/23 2139   heparin injection 5,000 Units  5,000 Units Subcutaneous Q8H Smith, Rondell A, MD   5,000 Units at 08/17/23 2140   insulin aspart (novoLOG) injection 0-6 Units  0-6 Units Subcutaneous TID WC Smith, Rondell A, MD   1 Units at 08/17/23 1800   insulin glargine (LANTUS) injection 7 Units  7 Units Subcutaneous QHS Madelyn Flavors A, MD   7 Units at 08/17/23 2139   isosorbide mononitrate (IMDUR) 24 hr tablet 30 mg  30 mg Oral Daily Zannie Cove, MD   30 mg at 08/18/23 0948   latanoprost (XALATAN) 0.005 % ophthalmic solution 1 drop  1 drop Both Eyes QHS Madelyn Flavors A, MD   1 drop at 08/17/23 2139  levothyroxine (SYNTHROID) tablet 50 mcg  50 mcg Oral Daily Madelyn Flavors A, MD   50 mcg at 08/17/23 0606   metolazone (ZAROXOLYN) tablet 5 mg  5 mg Oral Daily [name], DO   5 mg at 08/18/23 0946   nitroGLYCERIN (NITROSTAT) SL tablet 0.4 mg  0.4 mg Sublingual Q5 min PRN Madelyn Flavors A, MD       ondansetron (ZOFRAN) tablet 4 mg  4 mg Oral Q6H PRN Madelyn Flavors A, MD       Or   ondansetron (ZOFRAN) injection 4 mg  4 mg Intravenous Q6H PRN Smith, Rondell A, MD       sevelamer carbonate (RENVELA) tablet 800 mg  800 mg Oral TID WC Maxie Barb, MD   800 mg at 08/17/23 1806   tamsulosin (FLOMAX) capsule 0.4 mg  0.4 mg Oral QPC breakfast Madelyn Flavors A, MD   0.4 mg at 08/17/23 0915    Pt meds include: Statin :Yes Betablocker: Yes ASA: Yes Other anticoagulants/antiplatelets: None  Past Medical History:  Diagnosis Date   Abdominopelvic abscess (HCC) 09/29/2018   Abscess of appendix  09/22/2018   Acquired spondylolisthesis of lumbosacral region 03/20/2020   Anemia of chronic disease 08/18/2019   Aortic atherosclerosis (HCC)    Aortic stenosis    a.) TTE 09/15/2018: mild-mod (MPG 19); b.) TTE 03/21/2021: mild-mod (MPG 14.3); c.) TTE 04/24/2022: mild- mod (MPG 15)   Appendicitis 09/08/2018   Asthma    Benign hypertension with CKD (chronic kidney disease) stage IV (HCC) 02/24/2018   Benign prostatic hyperplasia without lower urinary tract symptoms 01/14/2018   Bilateral lower extremity edema 11/04/2018   Bradycardia 03/25/2018   Bruit of right carotid artery 07/26/2015   CAD (coronary artery disease) 2003   a.) s/p 4v CABG 2003   Cardiac murmur 07/26/2015   Chronic combined systolic and diastolic CHF (congestive heart failure) (HCC) 07/26/2015   a.) TTE 09/15/2018: EF 50-55%, mod LVH, RVE, BAE, mild-mod TR, AoV sclerosis, G1DD; b.) MPI 10/27/2019: EF <30%; c.) TTE 03/21/2021: EF 35-40%, post AK, inf HK, mod LVH, mod red RVSF, mod LAE, mod Aov sclerosis, G2DD; c.) TTE 04/24/2022: EF 35-40%, post AK, glok HK, mod LVE, mod red RVSF, mild-mod MR, Aov sclerosis, G3DD   Chronic idiopathic constipation 11/02/2018   Chronic low back pain without sciatica 12/02/2018   Chronic pain of right hip 03/31/2020   CKD (chronic kidney disease) stage 4, GFR 15-29 ml/min (HCC) 10/07/2018   Coronary artery disease    Dysphagia 07/26/2015   Erectile dysfunction 07/14/2017   Essential hypertension 02/24/2018   Foot ulcer, left (HCC)    GERD (gastroesophageal reflux disease)    Hematuria 07/27/2015   History of marijuana use    Hyperlipidemia 07/26/2015   Hypertension    Hypothyroidism 11/14/2015   Insomnia 11/02/2018   Lumbar spondylosis 03/20/2020   Malaise and fatigue 03/25/2018   Myocardial infarction due to demand ischemia (HCC) 09/26/2020   a.) Type II NSTEMI; b.) troponins were trended 0.54 --> 0.56 --> 0.52 ng/mL   Non-compliance 05/05/2019   Osseous and subluxation stenosis  of intervertebral foramina of lumbar region 03/20/2020   Osteoarthritis 10/06/2019   Peripheral vascular disease (HCC)    Personal history of tobacco use, presenting hazards to health 09/27/2020   Polyneuropathy in diabetes (HCC) 05/07/2019   Pulmonary HTN (HCC)    a.) TTE 05/12/2019: PASP 71; b.) TTE 09/27/2020: PASP >70; c.) TTE 03/21/2021: RVSP 43; d.) TTE 04/24/2022: RVSP 80.6   Puncture wound of  right hip 04/02/2018   PVD (peripheral vascular disease) with claudication (HCC)    a.) s/p PTA 03/02/2018 - balloon angioplasty LEFT below knee popliteal artery; b.) s/p PTA 09/30/2022: baloon angioplasty LEFT tibioperoneal trunck, most proximal peroneal artery, and LEFT popliteal artery.   S/P CABG x 4 2003   Sleep apnea 07/26/2015   a.) does not require nocturnal PAP therapy   Subacute osteomyelitis of left foot (HCC) 03/25/2018   Thrombocytopenia (HCC)    Type 2 diabetes mellitus with stage 4 chronic kidney disease, with long-term current use of insulin (HCC) 07/26/2015   Vitamin B12 deficiency 06/16/2019   Vitamin D deficiency 05/07/2018    Past Surgical History:  Procedure Laterality Date   AMPUTATION TOE Left 03/13/2018   Procedure: AMPUTATION TOE-MPJ;  Surgeon: Gwyneth Revels, DPM;  Location: [facility] ORS;  Service: Podiatry;  Laterality: Left;   ANGIOPLASTY     CARDIAC CATHETERIZATION     CORONARY ARTERY BYPASS GRAFT N/A 2003   IR FLUORO GUIDE CV LINE RIGHT  08/18/2023   IR US GUIDE VASC ACCESS RIGHT  08/18/2023   LAPAROSCOPIC APPENDECTOMY N/A 09/08/2018   Procedure: APPENDECTOMY LAPAROSCOPIC;  Surgeon: Leafy Ro, MD;  Location: [facility] ORS;  Service: General;  Laterality: N/A;   LOWER EXTREMITY ANGIOGRAPHY Left 03/02/2018   Procedure: LOWER EXTREMITY ANGIOGRAPHY;  Surgeon: Annice Needy, MD;  Location: [facility] INVASIVE CV LAB;  Service: Cardiovascular;  Laterality: Left;   LOWER EXTREMITY ANGIOGRAPHY Left 09/30/2022   Procedure: Lower Extremity Angiography;  Surgeon: Annice Needy, MD;   Location: [facility] INVASIVE CV LAB;  Service: Cardiovascular;  Laterality: Left;   RIGHT/LEFT HEART CATH AND CORONARY/GRAFT ANGIOGRAPHY N/A 08/01/2023   Procedure: RIGHT/LEFT HEART CATH AND CORONARY/GRAFT ANGIOGRAPHY;  Surgeon: Orbie Pyo, MD;  Location: [facility] INVAS[facility] CV [facility];  Service: Cardiovascular;  Laterality: N/A;   ROTATOR CUFF REPAIR Left     Social History Social History   Tobacco Use   Smoking status: Former    Current packs/day: 0.00    Average packs/day: 0.5 packs/day for 25.0 years (12.5 ttl pk-yrs)    Types: Cigarettes    Start date: 45    Quit date: 2003    Years since quitting: 22.2   Smokeless tobacco: Never  Vaping Use   Vaping status: Never Used  Substance Use Topics   Alcohol use: Not Currently    Comment: 2003   Drug use: Not Currently    Types: Marijuana    Family History Family History  Problem Relation Age of Onset   Hyperlipidemia Mother    Hypertension Mother    Diabetes Mother    Heart attack Brother    Heart disease Brother    Lung disease Father     No Known Allergies   REVIEW OF SYSTEMS  General: [ ]  Weight loss, [ ]  Fever, [ ]  chills Neurologic: [ ]  Dizziness, [ ]  Blackouts, [ ]  Seizure [ ]  Stroke, [ ]  "Mini stroke", [ ]  Slurred speech, [ ]  Temporary blindness; [ ]  weakness in arms or legs, [ ]  Hoarseness [ ]  Dysphagia Cardiac: [ ]  Chest pain/pressure, [ ]  Shortness of breath at rest [ ]  Shortness of breath with exertion, [ ]  Atrial fibrillation or irregular heartbeat  Vascular: [ ]  Pain in legs with walking, [ ]  Pain in legs at rest, [ ]  Pain in legs at night,  [ ]  Non-healing ulcer, [ ]  Blood clot in vein/DVT,   Pulmonary: [ ]  Home oxygen, [ ]  Productive cough, [ ]  Coughing  up blood, [ ]  Asthma,  [ ]  Wheezing [ ]  COPD Musculoskeletal:  [ ]  Arthritis, [ ]  Low back pain, [ ]  Joint pain Hematologic: [ ]  Easy Bruising, [ ]  Anemia; [ ]  Hepatitis Gastrointestinal: [ ]  Blood in stool, [ ]  Gastroesophageal Reflux/heartburn, Urinary: [ ]   chronic Kidney disease, [ ]  on HD - [ ]  MWF or [ ]  TTHS, [ ]  Burning with urination, [ ]  Difficulty urinating Skin: [ ]  Rashes, [ ]  Wounds Psychological: [ ]  Anxiety, [ ]  Depression  Physical Examination Vitals:   08/18/23 0825 08/18/23 0830 08/18/23 0838 08/18/23 0900  BP: 138/83 135/84 (!) 143/81 135/72  Pulse: 71 72 71 72  Resp: 15 20 11 14   Temp:    97.6 F (36.4 C)  TempSrc:    Oral  SpO2: 100% 100% 100%   Weight:      Height:       Body mass index is 25.79 kg/m.  General:  WDWN in NAD HENT: WNL Eyes: Pupils equal Pulmonary: normal non-labored breathing , without Rales, rhonchi,  wheezing Cardiac: RRR, without  Murmurs, rubs or gallops; No carotid bruits Abdomen: soft, NT, no masses Skin: no rashes, ulcers noted;  no Gangrene , no cellulitis; no open wounds;   Vascular Exam/Pulses: palpable radial pulses B    Musculoskeletal: no muscle wasting or atrophy; no edema  Neurologic: A&O X 3; Appropriate Affect ;  SENSATION: normal; MOTOR FUNCTION: 5/5 Symmetric Speech is fluent/normal   Significant Diagnostic Studies: CBC Lab Results  Component Value Date   WBC 5.9 08/16/2023   HGB 9.0 (L) 08/16/2023   HCT 27.5 (L) 08/16/2023   MCV 91.4 08/16/2023   PLT 104 (L) 08/16/2023    BMET    Component Value Date/Time   NA 138 08/18/2023 0301   NA 145 (H) 07/14/2023 1412   NA 136 08/30/2013 1025   K 3.6 08/18/2023 0301   K 3.7 08/30/2013 1025   CL 101 08/18/2023 0301   CL 101 08/30/2013 1025   CO2 24 08/18/2023 0301   CO2 31 08/30/2013 1025   GLUCOSE 128 (H) 08/18/2023 0301   GLUCOSE 310 (H) 08/30/2013 1025   BUN 145 (H) 08/18/2023 0301   BUN 97 (HH) 07/14/2023 1412   BUN 17 08/30/2013 1025   CREATININE 6.07 (H) 08/18/2023 0301   CREATININE 1.15 08/30/2013 1025   CALCIUM 8.0 (L) 08/18/2023 0301   CALCIUM 9.4 08/30/2013 1025   GFRNONAA 9 (L) 08/18/2023 0301   GFRNONAA >60 08/30/2013 1025   GFRAA 27 (L) 02/22/2020 0539   GFRAA >60 08/30/2013 1025    Estimated Creatinine Clearance: 13.9 mL/min (A) (by C-G formula based on SCr of 6.07 mg/dL (H)).  COAG Lab Results  Component Value Date   INR 1.4 (H) 08/17/2023   INR 1.2 09/23/2018   INR 1.3 (H) 09/22/2018     Non-Invasive Vascular Imaging:  Pending vein mapping and arterial duplex UE B  ASSESSMENT/PLAN:  AKI on CKD now ESRD He is right hand dominant.  We will plan left UE AV fistula verse graft vein mapping and arterial duplex have been ordered.  He has a working [facility] placed by [employer].    DR. Karin Lieu will look at the OR schedule and decide when we can provide access.  Questions and concerns were discussed with the patient and his family he wishes to proceed with access.     Mosetta Pigeon 08/18/2023 10:18 AM   VASCULAR STAFF ADDENDUM: I have independently interviewed and examined the  patient. I agree with the above.  I had a long conversation with Jamis, his daughter, and son-in-law. We discussed left arm brachiobasilic fistula creation with the understanding that this will require a subsequent superficialization. We discussed the risks and benefits including bleeding, infection, monomelic neuropathy, steal syndrome.  After discussing these, Kiril elected to proceed.  Will schedule for Wednesday. Please make n.p.o. Tuesday night.  Victorino Sparrow MD [facility] Phone Number: [phone] 08/18/2023 5:15 PM

## 2023-08-18 NOTE — Progress Notes (Signed)
 Completed 2 hours of dialysis. Tolerated net fluid removal of 1,000 ml. VS stable, no significant events. RIJ TDC saline locked and de-accessed using aseptic technique. Clamped and replaced new caps. Patient is alert and conversant. No signs of distress upon leaving dialysis unit. Handoff given to [employer].

## 2023-08-18 NOTE — Progress Notes (Signed)
 Upper extremity arterial duplex completed. Please see CV Procedures for preliminary results.  Shona Simpson, RVT 08/18/23 2:28 PM

## 2023-08-18 NOTE — TOC Progression Note (Signed)
 Transition of Care [facility]) - Progression Note    Patient Details  Name: Phillip [name] MRN: [id number] Date of Birth: [birth year]  Transition of Care [facility]) CM/SW Contact  Leone Haven, RN Phone Number: 08/18/2023, 3:05 PM  Clinical Narrative:    Temp HD cath placed, TOC following.   Expected Discharge Plan: Home/Self Care Barriers to Discharge: Continued Medical Work up  Expected Discharge Plan and Services   Discharge Planning Services: CM Consult Post Acute Care Choice: NA Living arrangements for the past 2 months: Single Family Home                 DME Arranged: N/A DME Agency: NA       HH Arranged: NA           Social Determinants of Health (SDOH) Interventions SDOH Screenings   Food Insecurity: No Food Insecurity (08/12/2023)  Housing: Low Risk  (08/12/2023)  Transportation Needs: No Transportation Needs (08/12/2023)  Utilities: Not At Risk (08/12/2023)  Depression (PHQ2-9): Low Risk  (10/22/2018)  Financial Resource Strain: Low Risk  (09/26/2020)   Received from [facility], [facility]  Physical Activity: Inactive (09/26/2020)   Received from [facility], [facility]  Social Connections: Moderately Integrated (08/12/2023)  Stress: No Stress Concern Present (09/26/2020)   Received from [facility], [facility]  Tobacco Use: Medium Risk (08/11/2023)  Health Literacy: Low Risk  (09/26/2020)   Received from [facility], [facility]    Readmission Risk Interventions    08/13/2023    4:28 PM  Readmission Risk Prevention Plan  Transportation Screening Complete  HRI or Home Care Consult Complete  Palliative Care Screening Not Applicable  Medication Review (RN Care Manager) Complete

## 2023-08-18 NOTE — Care Management Important Message (Signed)
 Important Message  Patient Details  Name: Phillip [name] MRN: [id number] Date of Birth: 1955-09-29   Important Message Given:  Yes - Medicare IM     Renie Ora 08/18/2023, 10:27 AM

## 2023-08-18 NOTE — Progress Notes (Signed)
 Upper extremity venous duplex completed. Please see CV Procedures for preliminary results.  Shona Simpson, RVT 08/18/23 2:27 PM

## 2023-08-18 NOTE — Plan of Care (Signed)
  Problem: Coping: Goal: Ability to adjust to condition or change in health will improve Outcome: Progressing   Problem: Fluid Volume: Goal: Ability to maintain a balanced intake and output will improve Outcome: Progressing   Problem: Metabolic: Goal: Ability to maintain appropriate glucose levels will improve Outcome: Progressing   Problem: Tissue Perfusion: Goal: Adequacy of tissue perfusion will improve Outcome: Progressing   

## 2023-08-19 ENCOUNTER — Inpatient Hospital Stay (HOSPITAL_COMMUNITY)

## 2023-08-19 DIAGNOSIS — I5020 Unspecified systolic (congestive) heart failure: Secondary | ICD-10-CM | POA: Diagnosis not present

## 2023-08-19 DIAGNOSIS — N185 Chronic kidney disease, stage 5: Secondary | ICD-10-CM | POA: Diagnosis not present

## 2023-08-19 LAB — CBC
HCT: 27 % — ABNORMAL LOW (ref 39.0–52.0)
Hemoglobin: 8.9 g/dL — ABNORMAL LOW (ref 13.0–17.0)
MCH: 29.9 pg (ref 26.0–34.0)
MCHC: 33 g/dL (ref 30.0–36.0)
MCV: 90.6 fL (ref 80.0–100.0)
Platelets: 122 10*3/uL — ABNORMAL LOW (ref 150–400)
RBC: 2.98 MIL/uL — ABNORMAL LOW (ref 4.22–5.81)
RDW: 14.7 % (ref 11.5–15.5)
WBC: 8 10*3/uL (ref 4.0–10.5)
nRBC: 0 % (ref 0.0–0.2)

## 2023-08-19 LAB — BASIC METABOLIC PANEL
Anion gap: 11 (ref 5–15)
BUN: 101 mg/dL — ABNORMAL HIGH (ref 8–23)
CO2: 28 mmol/L (ref 22–32)
Calcium: 8.2 mg/dL — ABNORMAL LOW (ref 8.9–10.3)
Chloride: 98 mmol/L (ref 98–111)
Creatinine, Ser: 4.68 mg/dL — ABNORMAL HIGH (ref 0.61–1.24)
GFR, Estimated: 13 mL/min — ABNORMAL LOW (ref >60)
Glucose, Bld: 171 mg/dL — ABNORMAL HIGH (ref 70–99)
Potassium: 3.4 mmol/L — ABNORMAL LOW (ref 3.5–5.1)
Sodium: 137 mmol/L (ref 135–145)

## 2023-08-19 LAB — GLUCOSE, CAPILLARY
Glucose-Capillary: 112 mg/dL — ABNORMAL HIGH (ref 70–99)
Glucose-Capillary: 129 mg/dL — ABNORMAL HIGH (ref 70–99)
Glucose-Capillary: 107 mg/dL — ABNORMAL HIGH (ref 70–99)
Glucose-Capillary: 174 mg/dL — ABNORMAL HIGH (ref 70–99)

## 2023-08-19 LAB — HEPATITIS B SURFACE ANTIBODY, QUANTITATIVE: Hep B S AB Quant (Post): <3.5 m[IU]/mL — ABNORMAL LOW

## 2023-08-19 MED ORDER — ALTEPLASE 2 MG IJ SOLR: 2.0000 mg | Freq: Once | INTRAMUSCULAR | Status: DC | PRN

## 2023-08-19 MED ORDER — PENTAFLUOROPROP-TETRAFLUOROETH EX AERO: 1.0000 | INHALATION_SPRAY | CUTANEOUS | Status: DC | PRN

## 2023-08-19 MED ORDER — HEPARIN SODIUM (PORCINE) 1000 UNIT/ML IJ SOLN
3200.0000 [IU] | Freq: Once | INTRAMUSCULAR | Status: AC
  Administered 2023-08-19: 3200 [IU]
  Filled 2023-08-19: qty 4

## 2023-08-19 MED ORDER — HEPARIN SODIUM (PORCINE) 1000 UNIT/ML DIALYSIS: 1000.0000 [IU] | INTRAMUSCULAR | Status: DC | PRN

## 2023-08-19 MED ORDER — ANTICOAGULANT SODIUM CITRATE 4% (200MG/5ML) IV SOLN: 5.0000 mL | Status: DC | PRN

## 2023-08-19 MED ORDER — RENA-VITE PO TABS
1.0000 | ORAL_TABLET | Freq: Every day | ORAL | Status: DC
  Administered 2023-08-19 – 2023-08-22 (×4): 1
  Filled 2023-08-19 (×5): qty 1

## 2023-08-19 MED ORDER — CEFAZOLIN SODIUM-DEXTROSE 1-4 GM/50ML-% IV SOLN
1.0000 g | INTRAVENOUS | Status: AC
  Administered 2023-08-20: 2 g via INTRAVENOUS
  Filled 2023-08-19 (×2): qty 50

## 2023-08-19 MED ORDER — ENSURE ENLIVE PO LIQD
237.0000 mL | Freq: Three times a day (TID) | ORAL | Status: DC
  Administered 2023-08-19 – 2023-08-25 (×13): 237 mL via ORAL
  Filled 2023-08-19 (×2): qty 237

## 2023-08-19 MED ORDER — MELATONIN 3 MG PO TABS
3.0000 mg | ORAL_TABLET | Freq: Every evening | ORAL | Status: DC | PRN
  Administered 2023-08-19 (×2): 3 mg via ORAL
  Filled 2023-08-19 (×3): qty 1

## 2023-08-19 MED ORDER — LIDOCAINE HCL (PF) 1 % IJ SOLN: 5.0000 mL | INTRAMUSCULAR | Status: DC | PRN

## 2023-08-19 MED ORDER — LIDOCAINE-PRILOCAINE 2.5-2.5 % EX CREA: 1.0000 | TOPICAL_CREAM | CUTANEOUS | Status: DC | PRN

## 2023-08-19 NOTE — Progress Notes (Signed)
 Received patient in bed to unit.  Alert and oriented.  Informed consent signed and in chart.   TX duration: 3 hours  Patient tolerated well.  Transported back to the room  Alert, without acute distress.  Hand-off given to patient's nurse.   Access used: R internal jugular HD Cath Access issues: none  Total UF removed: 2L Medication(s) given: none   08/19/23 1707  Vitals  Temp 98 F (36.7 C)  BP (!) 141/75  Pulse Rate 83  Resp 15  Oxygen Therapy  SpO2 98 %  O2 Device Room Air  Patient Activity (if Appropriate) In bed  Pulse Oximetry Type Continuous  During Treatment Monitoring  Duration of HD Treatment -hour(s) 3 hour(s)  HD Safety Checks Performed Yes  Intra-Hemodialysis Comments Tx completed;Tolerated well  Dialysis Fluid Bolus Normal Saline  Bolus Amount (mL) 300 mL  Post Treatment  Dialyzer Clearance Heavily streaked  Liters Processed 45  Fluid Removed (mL) 2000 mL  Tolerated HD Treatment Yes  Hemodialysis Catheter Right Internal jugular Double lumen Permanent (Tunneled)  Placement Date/Time: 08/18/23 0824   Serial / Lot #: [id number]  Expiration Date: 03/02/28  Time Out: Correct patient;Correct site;Correct procedure  Maximum sterile barrier precautions: Hand hygiene;Cap;Mask;Sterile gown;Sterile gloves;Large sterile s...  Site Condition No complications  Blue Lumen Status Flushed;Heparin locked;Dead end cap in place  Red Lumen Status Flushed;Dead end cap in place;Heparin locked  Purple Lumen Status N/A  Catheter fill solution Heparin 1000 units/ml  Catheter fill volume (Arterial) 1.6 cc  Catheter fill volume (Venous) 1.6  Dressing Type Transparent  Dressing Status Other (Comment);Antimicrobial disc/dressing in place;Clean, Dry, Intact (deaccessed)  Drainage Description None  Dressing Change Due 08/21/23  Post treatment catheter status Capped and Clamped     Stacie Glaze LPN Kidney Dialysis Unit

## 2023-08-19 NOTE — TOC Progression Note (Signed)
 Transition of Care [facility]) - Progression Note    Patient Details  Name: Phillip [name] MRN: [id number] Date of Birth: Aug 27, 1955  Transition of Care [facility]) CM/SW Contact  Leone Haven, RN Phone Number: 08/19/2023, 1:15 PM  Clinical Narrative:    Clip for outpatient HD pending, plan for AVF tomorrow. TOC following.     Expected Discharge Plan: Home/Self Care Barriers to Discharge: Continued Medical Work up  Expected Discharge Plan and Services   Discharge Planning Services: CM Consult Post Acute Care Choice: NA Living arrangements for the past 2 months: Single Family Home                 DME Arranged: N/A DME Agency: NA       HH Arranged: NA           Social Determinants of Health (SDOH) Interventions SDOH Screenings   Food Insecurity: No Food Insecurity (08/12/2023)  Housing: Low Risk  (08/12/2023)  Transportation Needs: No Transportation Needs (08/12/2023)  Utilities: Not At Risk (08/12/2023)  Depression (PHQ2-9): Low Risk  (10/22/2018)  Financial Resource Strain: Low Risk  (09/26/2020)   Received from [facility], [facility]  Physical Activity: Inactive (09/26/2020)   Received from [facility], [facility]  Social Connections: Moderately Integrated (08/12/2023)  Stress: No Stress Concern Present (09/26/2020)   Received from [facility], [facility]  Tobacco Use: Medium Risk (08/11/2023)  Health Literacy: Low Risk  (09/26/2020)   Received from [facility], [facility]    Readmission Risk Interventions    08/13/2023    4:28 PM  Readmission Risk Prevention Plan  Transportation Screening Complete  HRI or Home Care Consult Complete  Palliative Care Screening Not Applicable  Medication Review (RN Care Manager) Complete

## 2023-08-19 NOTE — Plan of Care (Signed)
  Problem: Health Behavior/Discharge Planning: Goal: Ability to manage health-related needs will improve Outcome: Progressing   Problem: Nutrition: Goal: Adequate nutrition will be maintained Outcome: Progressing   Problem: Elimination: Goal: Will not experience complications related to bowel motility Outcome: Progressing   

## 2023-08-19 NOTE — Progress Notes (Signed)
 [facility] NEPHROLOGY PROGRESS NOTE  Assessment/ Plan: Pt is a 68 y.o. yo male with HTN, DM, CKD 5 thought to be due to diabetic nephropathy followed by Dr. Janit Pagan at [facility] with a recent baseline creatinine level seems to be around 5, CHF with preserved EF, CAD status post CABG, presented with worsening dyspnea on exertion, lower extremity edema worsening for 3 to 4 weeks.    #Acute kidney injury on CKD 5 with fluid overload/CHF exacerbation now progressing to ESRD: CKD thought to be due to diabetic nephropathy, follows with nephrologist in [facility].  This sounds like progressive CKD.   Urine output is increasing with current high-dose IV diuretics and metolazone.  continued current diuretics especially since he still has significant lower extremity edema.  He was not interested in dialysis at least this admit- therefore palliative care team was consulted to address goals of care. He has a cousin and friend on dialysis and they "look terrible"; after continued education he was willing to start HD.  Planning for [facility] by VIR and HD #1 3/17 w/ 1L net UF. HD #2 today with 2L UF as tolerated; patient has 1-2+ edema in lower extremities, then Thur on TTS regimen.  Appreciate Dr. Karin Lieu seeing the pt for access (likely surgery on Wednesday) and navigator involvement; CLIP in process.  Lives in [place] and referral submitted to [facility].   #Acute on chronic CHF: Cardiology seeing,  diuretics.  Lasix 120 q 8 and metolazone 5 daily  will continue for now as well as UF with HD-  still has volume on board (1-2+) edema in the LE.   #Hypertension/volume: Continue antihypertensives and volume management with diuretics.  Continue fluid restriction and low-sodium diet.   #Anemia of CKD: Iron saturation 16% therefore ordered IV iron Venofer 200 3/12-3/14) and Aranesp (120 3/14).     #CKD-MBD/hyperphosphatemia: PTH 332 - and started sevelamer as a phosphorus binders and low dose  calcitriol. Recheck phos on wed.    Subjective: Seen and examined at bedside.  Urine output is recorded at 250.  Granddaughter bedside.  Patient has some cough but denies any myalgias.  Tolerated dialysis yesterday; denies nausea, vomiting, dysphagia, chest pain or shortness of breath.  Has reluctantly accepted his fate.   Vitals:   08/19/23 0030 08/19/23 0031 08/19/23 0445 08/19/23 0741  BP:   130/67 131/76  Pulse: 80 80 77 77  Resp:   18 18  Temp:   98.7 F (37.1 C) 98.7 F (37.1 C)  TempSrc:   Oral Oral  SpO2: 96% 97%  94%  Weight:      Height:       Weight change: -0.5 kg  Intake/Output Summary (Last 24 hours) at 08/19/2023 0757 Last data filed at 08/19/2023 0024 Gross per 24 hour  Intake 120 ml  Output 1300 ml  Net -1180 ml       Labs: RENAL PANEL Recent Labs  Lab 08/13/23 0224 08/14/23 0217 08/15/23 0235 08/16/23 0324 08/17/23 0228 08/18/23 0301 08/19/23 0312  NA 140 139 139 140 140 138 137  K 5.2* 4.8 4.3 4.2 4.1 3.6 3.4*  CL 110 106 105 104 104 101 98  CO2 19* 19* 21* 21* 23 24 28   GLUCOSE 127* 114* 142* 129* 134* 128* 171*  BUN 130* 128* 135* 143* 142* 145* 101*  CREATININE 5.95* 6.07* 5.98* 6.09* 6.05* 6.07* 4.68*  CALCIUM 7.7* 7.6* 7.6* 8.1* 8.1* 8.0* 8.2*  PHOS 6.2* 6.1* 5.8*  --   --   --   --  ALBUMIN 3.3* 3.2* 3.0*  --   --   --   --     Liver Function Tests: Recent Labs  Lab 08/13/23 0224 08/14/23 0217 08/15/23 0235  ALBUMIN 3.3* 3.2* 3.0*   No results for input(s): "LIPASE", "AMYLASE" in the last 168 hours. No results for input(s): "AMMONIA" in the last 168 hours. CBC: Recent Labs    08/13/23 0224 08/14/23 0217 08/15/23 0235 08/16/23 0324 08/19/23 0312  HGB 8.5* 8.3* 7.9* 9.0* 8.9*  MCV 92.4 91.6 90.6 91.4 90.6  FERRITIN 140  --   --   --   --   TIBC 308  --   --   --   --   IRON 50  --   --   --   --     Cardiac Enzymes: No results for input(s): "CKTOTAL", "CKMB", "CKMBINDEX", "TROPONINI" in the last 168  hours. CBG: Recent Labs  Lab 08/17/23 2104 08/18/23 0532 08/18/23 1140 08/18/23 1611 08/18/23 2140  GLUCAP 205* 121* 173* 243* 262*    Iron Studies:  No results for input(s): "IRON", "TIBC", "TRANSFERRIN", "FERRITIN" in the last 72 hours.  Studies/Results: VAS [place] UPPER EXT VEIN MAPPING (PRE-OP AVF) Result Date: 08/18/2023 UPPER EXTREMITY VEIN MAPPING Patient Name:  KAREN KINNARD  Date of Exam:   08/18/2023 Medical Rec #: [id number]         Accession #:    [id number] Date of Birth: [birth year]        Patient Gender: M Patient Age:   39 years Exam Location:  [facility] Procedure:      VAS [place] UPPER EXT VEIN MAPPING (PRE-OP AVF) Referring Phys: Paulene Floor --------------------------------------------------------------------------------  Indications: Pre-op AVF. Comparison Study: None Performing Technologist: Shona Simpson  Examination Guidelines: A complete evaluation includes B-mode imaging, spectral Doppler, color Doppler, and power Doppler as needed of all accessible portions of each vessel. Bilateral testing is considered an integral part of a complete examination. Limited examinations for reoccurring indications may be performed as noted. +-----------------+-------------+----------+---------+ Right Cephalic   Diameter (cm)Depth (cm)Findings  +-----------------+-------------+----------+---------+ Shoulder             0.27        1.20             +-----------------+-------------+----------+---------+ Prox upper arm       0.20        0.96             +-----------------+-------------+----------+---------+ Mid upper arm        0.18        0.36             +-----------------+-------------+----------+---------+ Dist upper arm       0.18        0.38             +-----------------+-------------+----------+---------+ Antecubital fossa    0.34        0.39   branching +-----------------+-------------+----------+---------+ Prox forearm         0.26        0.59    branching +-----------------+-------------+----------+---------+ Mid forearm          0.09        0.46             +-----------------+-------------+----------+---------+ Dist forearm         0.26        0.24             +-----------------+-------------+----------+---------+ Wrist  0.16        0.23             +-----------------+-------------+----------+---------+ +-----------------+-------------+----------+--------------+ Right Basilic    Diameter (cm)Depth (cm)   Findings    +-----------------+-------------+----------+--------------+ Shoulder                                not visualized +-----------------+-------------+----------+--------------+ Prox upper arm                          not visualized +-----------------+-------------+----------+--------------+ Mid upper arm                           not visualized +-----------------+-------------+----------+--------------+ Dist upper arm       0.44        1.10                  +-----------------+-------------+----------+--------------+ Antecubital fossa    0.37        0.46     branching    +-----------------+-------------+----------+--------------+ Prox forearm         0.28        0.35                  +-----------------+-------------+----------+--------------+ Mid forearm          0.23        0.34                  +-----------------+-------------+----------+--------------+ Distal forearm       0.15        0.20                  +-----------------+-------------+----------+--------------+ Wrist                0.13        0.29                  +-----------------+-------------+----------+--------------+ +-----------------+-------------+----------+---------+ Left Cephalic    Diameter (cm)Depth (cm)Findings  +-----------------+-------------+----------+---------+ Shoulder             0.30        1.36             +-----------------+-------------+----------+---------+ Prox upper arm        0.19        0.78             +-----------------+-------------+----------+---------+ Mid upper arm        0.21        0.33             +-----------------+-------------+----------+---------+ Dist upper arm       0.18        0.57   branching +-----------------+-------------+----------+---------+ Antecubital fossa    0.38        0.47   branching +-----------------+-------------+----------+---------+ Prox forearm         0.28        0.47             +-----------------+-------------+----------+---------+ Mid forearm          0.17        0.30   branching +-----------------+-------------+----------+---------+ Dist forearm         0.16        0.22             +-----------------+-------------+----------+---------+ Wrist  0.19        0.22             +-----------------+-------------+----------+---------+ +-----------------+-------------+----------+--------------+ Left Basilic     Diameter (cm)Depth (cm)   Findings    +-----------------+-------------+----------+--------------+ Shoulder                                not visualized +-----------------+-------------+----------+--------------+ Prox upper arm       0.35        1.20                  +-----------------+-------------+----------+--------------+ Mid upper arm        0.24        1.00     branching    +-----------------+-------------+----------+--------------+ Dist upper arm       0.26        0.71                  +-----------------+-------------+----------+--------------+ Antecubital fossa    0.31        0.63                  +-----------------+-------------+----------+--------------+ Prox forearm         0.16        0.28     branching    +-----------------+-------------+----------+--------------+ Mid forearm          0.96        0.23                  +-----------------+-------------+----------+--------------+ Distal forearm       0.09        0.25                   +-----------------+-------------+----------+--------------+ Wrist                0.07        0.17                  +-----------------+-------------+----------+--------------+ *See table(s) above for measurements and observations.  Diagnosing physician: Gerarda Fraction Electronically signed by Gerarda Fraction on 08/18/2023 at 5:34:02 PM.    Final    VAS [place] UPPER EXTREMITY ARTERIAL DUPLEX Result Date: 08/18/2023  UPPER EXTREMITY DUPLEX STUDY Patient Name:  WINFERD WEASE  Date of Exam:   08/18/2023 Medical Rec #: [id number]         Accession #:    [id number] Date of Birth: 09/15/1955        Patient Gender: M Patient Age:   73 years Exam Location:  [facility] Procedure:      VAS [place] UPPER EXTREMITY ARTERIAL DUPLEX Referring Phys: EMMA COLLINS --------------------------------------------------------------------------------  Indications: AVF Pre-op.  Risk Factors: Hypertension, hyperlipidemia, Diabetes, prior MI. Limitations: None Comparison Study: None. Performing Technologist: Shona Simpson  Examination Guidelines: A complete evaluation includes B-mode imaging, spectral Doppler, color Doppler, and power Doppler as needed of all accessible portions of each vessel. Bilateral testing is considered an integral part of a complete examination. Limited examinations for reoccurring indications may be performed as noted.  Right Doppler Findings: +--------------+----------+--------+--------+--------+ Site          PSV (cm/s)WaveformStenosisComments +--------------+----------+--------+--------+--------+ Subclavian Mid65                                 +--------------+----------+--------+--------+--------+ Axillary      52                                 +--------------+----------+--------+--------+--------+  Brachial Dist 64                                 +--------------+----------+--------+--------+--------+ Radial Dist   66                                  +--------------+----------+--------+--------+--------+ Ulnar Dist    66                                 +--------------+----------+--------+--------+--------+   Left Doppler Findings: +--------------+----------+--------+-------------+--------+ Site          PSV (cm/s)WaveformStenosis     Comments +--------------+----------+--------+-------------+--------+ Subclavian Mid66                                      +--------------+----------+--------+-------------+--------+ Axillary      129               <50% stenosis         +--------------+----------+--------+-------------+--------+ Brachial Dist 85                                      +--------------+----------+--------+-------------+--------+ Radial Dist   79                                      +--------------+----------+--------+-------------+--------+ Ulnar Dist    57                                      +--------------+----------+--------+-------------+--------+   Summary:  Left: <50% stenosis visualized in the left upper extremity.       Obstruction noted in the axillary artery. *See table(s) above for measurements and observations. Electronically signed by Gerarda Fraction on 08/18/2023 at 5:33:51 PM.    Final    IR Fluoro Guide CV Line Right Result Date: 08/18/2023 CLINICAL DATA:  Renal failure, needs durable venous access for planned hemodialysis EXAM: TUNNELED HEMODIALYSIS CATHETER PLACEMENT WITH ULTRASOUND AND FLUOROSCOPIC GUIDANCE TECHNIQUE: The procedure, risks, benefits, and alternatives were explained to the patient. Questions regarding the procedure were encouraged and answered. The patient understands and consents to the procedure. As antibiotic prophylaxis, cefazolin 2 g was ordered pre-procedure and administered intravenously within one hour of incision.Patency of the right IJ vein was confirmed with ultrasound with image documentation. An appropriate skin site was determined. Region was prepped using maximum  barrier technique including cap and mask, sterile gown, sterile gloves, large sterile sheet, and Chlorhexidine as cutaneous antisepsis. The region was infiltrated locally with 1% lidocaine. Intravenous Fentanyl and Versed 1mg  were administered by RN during a total moderate (conscious) sedation time of 10 minutes; the patient's level of consciousness and physiological / cardiorespiratory status were monitored continuously by radiology RN under my direct supervision. Under real-time ultrasound guidance, the right IJ vein was accessed with a 21 gauge micropuncture needle; the needle tip within the vein was confirmed with ultrasound image documentation. Needle exchanged over the 018 guidewire for transitional dilator, which allowed advancement of a Benson wire into the IVC.  Over this, an MPA catheter was advanced. A Palindrome 19 hemodialysis catheter was tunneled from the right anterior chest wall approach to the right IJ dermatotomy site. The MPA catheter was exchanged over an Amplatz wire for serial vascular dilators which allow placement of a peel-away sheath, through which the catheter was advanced under intermittent fluoroscopy, positioned with its tips in the proximal and midright atrium. Spot chest radiograph confirms good catheter position. No pneumothorax. Catheter was flushed and primed per protocol. Catheter secured externally with O Prolene sutures. The right IJ dermatotomy site was closed with Dermabond. COMPLICATIONS: COMPLICATIONS None immediate FLUOROSCOPY: Radiation Exposure Index (as provided by the fluoroscopic device): 5 mGy air Kerma COMPARISON:  None Available. IMPRESSION: 1. Technically successful placement of tunneled right IJ hemodialysis catheter with ultrasound and fluoroscopic guidance. Ready for routine use. ACCESS: Remains approachable for percutaneous intervention as needed. Electronically Signed   By: Corlis Leak M.D.   On: 08/18/2023 09:53   IR US Guide Vasc Access Right Result  Date: 08/18/2023 CLINICAL DATA:  Renal failure, needs durable venous access for planned hemodialysis EXAM: TUNNELED HEMODIALYSIS CATHETER PLACEMENT WITH ULTRASOUND AND FLUOROSCOPIC GUIDANCE TECHNIQUE: The procedure, risks, benefits, and alternatives were explained to the patient. Questions regarding the procedure were encouraged and answered. The patient understands and consents to the procedure. As antibiotic prophylaxis, cefazolin 2 g was ordered pre-procedure and administered intravenously within one hour of incision.Patency of the right IJ vein was confirmed with ultrasound with image documentation. An appropriate skin site was determined. Region was prepped using maximum barrier technique including cap and mask, sterile gown, sterile gloves, large sterile sheet, and Chlorhexidine as cutaneous antisepsis. The region was infiltrated locally with 1% lidocaine. Intravenous Fentanyl and Versed 1mg  were administered by RN during a total moderate (conscious) sedation time of 10 minutes; the patient's level of consciousness and physiological / cardiorespiratory status were monitored continuously by radiology RN under my direct supervision. Under real-time ultrasound guidance, the right IJ vein was accessed with a 21 gauge micropuncture needle; the needle tip within the vein was confirmed with ultrasound image documentation. Needle exchanged over the 018 guidewire for transitional dilator, which allowed advancement of a Benson wire into the IVC. Over this, an MPA catheter was advanced. A Palindrome 19 hemodialysis catheter was tunneled from the right anterior chest wall approach to the right IJ dermatotomy site. The MPA catheter was exchanged over an Amplatz wire for serial vascular dilators which allow placement of a peel-away sheath, through which the catheter was advanced under intermittent fluoroscopy, positioned with its tips in the proximal and midright atrium. Spot chest radiograph confirms good catheter  position. No pneumothorax. Catheter was flushed and primed per protocol. Catheter secured externally with O Prolene sutures. The right IJ dermatotomy site was closed with Dermabond. COMPLICATIONS: COMPLICATIONS None immediate FLUOROSCOPY: Radiation Exposure Index (as provided by the fluoroscopic device): 5 mGy air Kerma COMPARISON:  None Available. IMPRESSION: 1. Technically successful placement of tunneled right IJ hemodialysis catheter with ultrasound and fluoroscopic guidance. Ready for routine use. ACCESS: Remains approachable for percutaneous intervention as needed. Electronically Signed   By: Corlis Leak M.D.   On: 08/18/2023 09:53    Medications: Infusions:   ceFAZolin (ANCEF) IV     furosemide 120 mg (08/19/23 0559)    Scheduled Medications:  aspirin EC  81 mg Oral Daily   atorvastatin  20 mg Oral Daily   calcitRIOL  0.25 mcg Oral Daily   carvedilol  3.125 mg Oral BID   Chlorhexidine Gluconate  Cloth  6 each Topical Q0600   clopidogrel  75 mg Oral Daily   cyanocobalamin  500 mcg Oral Daily   [START ON 08/22/2023] darbepoetin (ARANESP) injection - NON-DIALYSIS  200 mcg Subcutaneous Q Fri-1800   gabapentin  300 mg Oral QHS   heparin injection (subcutaneous)  5,000 Units Subcutaneous Q8H   insulin aspart  0-6 Units Subcutaneous TID WC   insulin glargine  7 Units Subcutaneous QHS   isosorbide mononitrate  30 mg Oral Daily   latanoprost  1 drop Both Eyes QHS   levothyroxine  50 mcg Oral Daily   metolazone  5 mg Oral Daily   sevelamer carbonate  800 mg Oral TID WC   tamsulosin  0.4 mg Oral QPC breakfast    have reviewed scheduled and prn medications.  Physical Exam: General:NAD, comfortable Heart:RRR, s1s2 nl Lungs:clear b/l, no crackle Abdomen:soft, Non-tender, non-distended Extremities: Bilateral lower extremities edema++ Neurology: Alert, awake and following commands. Making jokes Access: RIJ TC  Shahidah Nesbitt W 08/19/2023,7:57 AM  LOS: 7 days

## 2023-08-19 NOTE — Progress Notes (Signed)
 Mobility Specialist Progress Note:   08/19/23 1015  Mobility  Activity Ambulated with assistance in hallway  Level of Assistance Contact guard assist, steadying assist  Assistive Device Cane  Distance Ambulated (ft) 200 ft  Activity Response Tolerated well  Mobility Referral Yes  Mobility visit 1 Mobility  Mobility Specialist Start Time (ACUTE ONLY) 1015  Mobility Specialist Stop Time (ACUTE ONLY) 1030  Mobility Specialist Time Calculation (min) (ACUTE ONLY) 15 min   Pt agreeable to mobility session. Co generalized fatigue d/t HD yesterday and no sleep. No overt LOB noted. Pt back in chair with all needs met.   Addison Lank Mobility Specialist Please contact via SecureChat or  Rehab office at [phone]

## 2023-08-19 NOTE — Progress Notes (Addendum)
 [facility]   Subjective  - No new complaints   Objective 131/76 77 98.7 F (37.1 C) (Oral) 18 94%  Intake/Output Summary (Last 24 hours) at 08/19/2023 0831 Last data filed at 08/19/2023 0024 Gross per 24 hour  Intake 120 ml  Output 1300 ml  Net -1180 ml   +-----------------+-------------+----------+---------+  Right Cephalic   Diameter (cm)Depth (cm)Findings   +-----------------+-------------+----------+---------+  Shoulder            0.27        1.20              +-----------------+-------------+----------+---------+  Prox upper arm       0.20        0.96              +-----------------+-------------+----------+---------+  Mid upper arm        0.18        0.36              +-----------------+-------------+----------+---------+  Dist upper arm       0.18        0.38              +-----------------+-------------+----------+---------+  Antecubital fossa    0.34        0.39   branching  +-----------------+-------------+----------+---------+  Prox forearm         0.26        0.59   branching  +-----------------+-------------+----------+---------+  Mid forearm          0.09        0.46              +-----------------+-------------+----------+---------+  Dist forearm         0.26        0.24              +-----------------+-------------+----------+---------+  Wrist               0.16        0.23              +-----------------+-------------+----------+---------+   +-----------------+-------------+----------+--------------+  Right Basilic    Diameter (cm)Depth (cm)   Findings     +-----------------+-------------+----------+--------------+  Shoulder                               not visualized  +-----------------+-------------+----------+--------------+  Prox upper arm                          not visualized  +-----------------+-------------+----------+--------------+  Mid upper  arm                           not visualized  +-----------------+-------------+----------+--------------+  Dist upper arm       0.44        1.10                   +-----------------+-------------+----------+--------------+  Antecubital fossa    0.37        0.46     branching     +-----------------+-------------+----------+--------------+  Prox forearm         0.28        0.35                   +-----------------+-------------+----------+--------------+  Mid forearm          0.23  0.34                   +-----------------+-------------+----------+--------------+  Distal forearm       0.15        0.20                   +-----------------+-------------+----------+--------------+  Wrist               0.13        0.29                   +-----------------+-------------+----------+--------------+   +-----------------+-------------+----------+---------+  Left Cephalic    Diameter (cm)Depth (cm)Findings   +-----------------+-------------+----------+---------+  Shoulder            0.30        1.36              +-----------------+-------------+----------+---------+  Prox upper arm       0.19        0.78              +-----------------+-------------+----------+---------+  Mid upper arm        0.21        0.33              +-----------------+-------------+----------+---------+  Dist upper arm       0.18        0.57   branching  +-----------------+-------------+----------+---------+  Antecubital fossa    0.38        0.47   branching  +-----------------+-------------+----------+---------+  Prox forearm         0.28        0.47              +-----------------+-------------+----------+---------+  Mid forearm          0.17        0.30   branching  +-----------------+-------------+----------+---------+  Dist forearm         0.16        0.22              +-----------------+-------------+----------+---------+  Wrist                0.19        0.22              +-----------------+-------------+----------+---------+   +-----------------+-------------+----------+--------------+  Left Basilic     Diameter (cm)Depth (cm)   Findings     +-----------------+-------------+----------+--------------+  Shoulder                               not visualized  +-----------------+-------------+----------+--------------+  Prox upper arm       0.35        1.20                   +-----------------+-------------+----------+--------------+  Mid upper arm        0.24        1.00     branching     +-----------------+-------------+----------+--------------+  Dist upper arm       0.26        0.71                   +-----------------+-------------+----------+--------------+  Antecubital fossa    0.31        0.63                   +-----------------+-------------+----------+--------------+  Prox forearm  0.16        0.28     branching     +-----------------+-------------+----------+--------------+  Mid forearm          0.96        0.23                   +-----------------+-------------+----------+--------------+  Distal forearm       0.09        0.25                   +-----------------+-------------+----------+--------------+  Wrist               0.07        0.17                   +-----------------+-------------+----------+--------------+    Right Doppler Findings:  +--------------+----------+--------+--------+--------+  Site         PSV (cm/s)WaveformStenosisComments  +--------------+----------+--------+--------+--------+  Subclavian Mid65                                  +--------------+----------+--------+--------+--------+  Axillary     52                                  +--------------+----------+--------+--------+--------+  Brachial Dist 64                                  +--------------+----------+--------+--------+--------+   Radial Dist   66                                  +--------------+----------+--------+--------+--------+  Ulnar Dist    66                                  +--------------+----------+--------+--------+--------+              Left Doppler Findings:  +--------------+----------+--------+-------------+--------+  Site         PSV (cm/s)WaveformStenosis     Comments  +--------------+----------+--------+-------------+--------+  Subclavian Mid66                                       +--------------+----------+--------+-------------+--------+  Axillary     129               <50% stenosis          +--------------+----------+--------+-------------+--------+  Brachial Dist 85                                       +--------------+----------+--------+-------------+--------+  Radial Dist   79                                       +--------------+----------+--------+-------------+--------+  Ulnar Dist    57                                       +--------------+----------+--------+-------------+--------+  Summary:    Left: <50% stenosis visualized in the left upper extremity.        Obstruction noted in the axillary artery.    Assessment/Planning: ESRD right hand dominant   Plan for left arm brachiobasilic fistula creation  This will be a first stage basilic that will require a second surgery.  DR. Karin Lieu discussed this with the patient and family they wish to proceed.   NPO past MN    Mosetta Pigeon 08/19/2023 8:31 AM --  VASCULAR STAFF ADDENDUM: I have independently interviewed and examined the patient. I agree with the above.  Patient will be scheduled for left arm brachiobasilic fistula versus graft creation tomorrow with my partner Dr. Chestine Spore. After discussing the risks and benefits of surgery, Yee elected to proceed.  Victorino Sparrow MD [facility] Phone Number: [phone] 08/19/2023 1:12 PM    Laboratory Lab Results: Recent Labs    08/19/23 0312  WBC 8.0  HGB 8.9*  HCT 27.0*  PLT 122*   BMET Recent Labs    08/18/23 0301 08/19/23 0312  NA 138 137  K 3.6 3.4*  CL 101 98  CO2 24 28  GLUCOSE 128* 171*  BUN 145* 101*  CREATININE 6.07* 4.68*  CALCIUM 8.0* 8.2*    COAG Lab Results  Component Value Date   INR 1.4 (H) 08/17/2023   INR 1.2 09/23/2018   INR 1.3 (H) 09/22/2018   No results found for: "PTT"

## 2023-08-19 NOTE — Progress Notes (Signed)
 Initial Nutrition Assessment  DOCUMENTATION CODES:   Non-severe (moderate) malnutrition in context of chronic illness  INTERVENTION:   Liberalize diet to REGULAR with No Salt Packets, 1.8 L fluid restriction  Ensure Enlive po TID, each supplement provides 350 kcal and 20 grams of protein.  Renal MVI daily  Initial verbal education provided to pt and his grand-daughter but separately. Written material provided and left in pt's room. Plan to follow-up as able to provide further diet education. Encouraged pt to follow-up with RD at outpatient dialysis post discharge.   Monitor phosphorus trend now that pt on iHD, not eating much. Consider holding binder therapy pending phosphorus trend  NUTRITION DIAGNOSIS:   Moderate Malnutrition related to chronic illness (CKD now progressed to ESRD with new iHD start, heart failure) as evidenced by mild fat depletion, moderate fat depletion, moderate muscle depletion, edema.   GOAL:   Patient will meet greater than or equal to 90% of their needs  MONITOR:   PO intake, Supplement acceptance, Labs, Weight trends  REASON FOR ASSESSMENT:   Consult Diet education  ASSESSMENT:   68 yo male admitted with AKI on CKD 5-progression to ESRD with new iHD start this admission, acute on chronic CHF. PMH includes HTN, CAD, PVD, HTN, DM, HLD, CKD, hypothyroidism  3/11 Admitted 3/17 1st iHD, Tunneled R internal jugular HD cath placed 3/18 2nd iHD  OR tomorrow with VS for AVF Tolerating iHD thus far  Granddaughter in pt's room on visit today but pt just left for iHD. RD able to speak with pt while in iHD but also spoke to Granddaughter separately.   Pt has not been eating well since admission (x 7 days), appetite is poor but pt also does not like the food options available on hospital menu. Lunch tray on tray table and pt ate about 1/4 [place] sandwich and possibly fruit cup. Recorded po intake around 50% on average. Not currently receiving any oral  nutrition supplements. Granddaughter reports pt appetite has been poor, not eating well. Discussed options including oral nutrition supplements; pt agreeable. Encouraged small frequent meals  Current wt 88.8 kg, some edema still present but improved since admission. Admission wt 101.2 kg with highest wt 104.4 kg. Current height 6'3" but pt reports he is 6'0". Pt reports UBW around 200 pounds, current wt 196 kg  Noted pt started on Renvela TID with meals. Noted phosphorus last checked on 3/14, several days prior to first iHD session. Recommend rechecking phosphorus and following trend. May be able to hold phos binders pending trend now that iHD initiated. Pt also with poor intake presently  Provided education on overall diet, importance pf adequate nutrition, in particular protein intake. Also educated on renal MVI and phosphorus binder.   1L fluid removed yesterday with iHD, noted pt has been making urine, liters on urine on some days.   Labs: CBGs 884-166 Potassium 3.4 (L) BUN 101 (H) Creatinine 4.68  Meds:  B12 Calcitriol Aranesp SS Novolog Lantus Renvela 1 tab TID with meals Lasix  NUTRITION - FOCUSED PHYSICAL EXAM: Flowsheet Row Most Recent Value  Orbital Region Moderate depletion  Upper Arm Region Moderate depletion  Thoracic and Lumbar Region Mild depletion  Buccal Region Mild depletion  Temple Region Moderate depletion  Clavicle Bone Region Moderate depletion  Clavicle and Acromion Bone Region Moderate depletion  Scapular Bone Region Moderate depletion  Dorsal Hand Moderate depletion  Patellar Region Moderate depletion  Anterior Thigh Region Moderate depletion  Posterior Calf Region Unable to assess  Edema (  RD Assessment) Moderate            Diet Order:   Diet Order             Diet NPO time specified  Diet effective midnight           Diet regular Room service appropriate? Yes with Assist; Fluid consistency: Thin; Fluid restriction: 1800 mL Fluid  Diet  effective now                   EDUCATION NEEDS:   Education needs have been addressed  Skin:  Skin Assessment: Reviewed RN Assessment  Last BM:  3/18  Height:   Ht Readings from Last 1 Encounters:  08/19/23 6' (1.829 m)    Weight:   Wt Readings from Last 1 Encounters:  08/19/23 88.8 kg     BMI:  Body mass index is 26.55 kg/m.  Estimated Nutritional Needs:   Kcal:  2100-2300 kcals  Protein:  110-130 g  Fluid:  1.8 L    Romelle Starcher MS, RDN, LDN, CNSC Registered Dietitian 3 Clinical Nutrition RD Inpatient Contact Info in Amion

## 2023-08-19 NOTE — Plan of Care (Signed)
   Problem: Education: Goal: Ability to describe self-care measures that may prevent or decrease complications (Diabetes Survival Skills Education) will improve Outcome: Progressing Goal: Individualized Educational Video(s) Outcome: Progressing

## 2023-08-19 NOTE — Progress Notes (Signed)
 Contacted attending and nephrologist regarding [facility] request for a TB test or chest x-ray to rule out TB. Clinic is needing one of these results in order to accept pt. Clinic cannot accept chest x-ray from this admission that was provided yesterday for review. Will assist as needed.   Olivia Canter Renal Navigator [phone]

## 2023-08-19 NOTE — Progress Notes (Signed)
 PROGRESS NOTE    MUAAZ BRAU  WUJ:[id number] DOB: 1956-01-02 DOA: 08/11/2023 PCP: Gordan Payment., MD  67/M with hypertension, type 2 diabetes mellitus, CKD 5, CAD/CABG, chronic systolic CHF, hypothyroidism presented to the ED with worsening shortness of breath and swelling, with orthopnea.  Followed by nephrology in [facility], has been reluctant to consider dialysis. -Also recently underwent right and left heart cath 2/28 for TAVR workup. -In the ED noted to be significantly volume overloaded, hemoglobin 9.8, creatinine 6.1, BNP 685, chest x-ray with hypoinflation -Admitted, started on high-dose diuretics, seen by cards and nephrology, initially refused dialysis, now amenable -3/17: Tunneled right IJ HD cath placed, started HD  Subjective: -Feels okay overall, tolerated dialysis yesterday, breathing is improving  Assessment and Plan:  AKI on CKD 5, progressive New ESRD -Baseline creatinine around 3-4, has been up to the 5 range recently, nephrology consulting, considered ESRD at this time -Remains on high-dose diuretics, 8.2 L negative -Finally after much back-and-forth has decided to move forward with starting HD, underwent tunneled HD catheter placement -Started HD 3/17, plan for repeat today -Decreased gabapentin and Flexeril dose -Clip for outpatient HD pending, plan for AVF tomorrow  Acute on chronic systolic CHF -Echo 1/25 at [facility] with EF 30-35% with wall motion abnormality, grade 2 DD, low-flow low gradient severe aortic stenosis, severe pulmonary hypertension -Complicated by CKD 5 -Now on HD -Continue Coreg, Imdur  Severe aortic stenosis, low-flow low gradient -Noted on echo 1/25, seen by structural heart team and underwent right and left heart cath with plan to complete TAVR protocol following renal recovery -Follow-up with structural heart team after dialysis   CAD Peripheral vascular disease Patient with prior history history of four-vessel CABG back  in 2003.  Patient had left heart cath 07/2023 which showed patent LIMA to LAD but all 3 vein grafts occluded. -Continue aspirin, Plavix, beta-blocker, statin   Essential hypertension Blood pressures currently maintained 138/76 to 146/81 -Continue Coreg, cut down clonidine and Imdur dose   Diabetes mellitus type 2, with long-term use of insulin Last available hemoglobin A1c was 7.2 when last checked on 12/13 -Continue home long-acting insulin -SSI   Pancytopenia Chronic.,  Stable monitor   Hyperlipidemia -Continue atorvastatin   Hypothyroidism TSH was noted to be 3.99 when checked on 07/01/2023. -Repeat TSH now is 7.9, check free T4/T3 in a.m. -Continue levothyroxine   BPH -Continue Flomax  DVT prophylaxis: Heparin  Advance Care Planning:   Code Status: Full Code   Consults: Cardiology and nephrology   Family Communication: Daughter at bedside Disposition: Pending stabilization of above issues   Consultants: Cards, nephrology   Procedures:   Antimicrobials:    Objective: Vitals:   08/19/23 0445 08/19/23 0741 08/19/23 1025 08/19/23 1141  BP: 130/67 131/76  122/64  Pulse: 77 77  77  Resp: 18 18  16   Temp: 98.7 F (37.1 C) 98.7 F (37.1 C)  98.6 F (37 C)  TempSrc: Oral Oral  Oral  SpO2:  94%  98%  Weight:   90.2 kg   Height:        Intake/Output Summary (Last 24 hours) at 08/19/2023 1220 Last data filed at 08/19/2023 0024 Gross per 24 hour  Intake 120 ml  Output 1300 ml  Net -1180 ml   Filed Weights   08/18/23 0516 08/18/23 1706 08/19/23 1025  Weight: 93.6 kg 93.1 kg 90.2 kg    Examination:  Gen: Awake, Alert, Oriented X 3,  HEENT: + JVD, right IJ HD catheter  Lungs: Good air movement bilaterally, CTAB CVS: S1S2/RRR Abd: soft, Non tender, non distended, BS present Extremities: 1-2+ edema Skin: no new rashes on exposed skin     Data Reviewed:   CBC: Recent Labs  Lab 08/13/23 0224 08/14/23 0217 08/15/23 0235 08/16/23 0324 08/19/23 0312   WBC 5.3 5.2 5.0 5.9 8.0  HGB 8.5* 8.3* 7.9* 9.0* 8.9*  HCT 26.6* 25.1* 24.2* 27.5* 27.0*  MCV 92.4 91.6 90.6 91.4 90.6  PLT 108* 91* 91* 104* 122*   Basic Metabolic Panel: Recent Labs  Lab 08/13/23 0224 08/14/23 0217 08/15/23 0235 08/16/23 0324 08/17/23 0228 08/18/23 0301 08/19/23 0312  NA 140 139 139 140 140 138 137  K 5.2* 4.8 4.3 4.2 4.1 3.6 3.4*  CL 110 106 105 104 104 101 98  CO2 19* 19* 21* 21* 23 24 28   GLUCOSE 127* 114* 142* 129* 134* 128* 171*  BUN 130* 128* 135* 143* 142* 145* 101*  CREATININE 5.95* 6.07* 5.98* 6.09* 6.05* 6.07* 4.68*  CALCIUM 7.7* 7.6* 7.6* 8.1* 8.1* 8.0* 8.2*  PHOS 6.2* 6.1* 5.8*  --   --   --   --    GFR: Estimated Creatinine Clearance: 18.1 mL/min (A) (by C-G formula based on SCr of 4.68 mg/dL (H)). Liver Function Tests: Recent Labs  Lab 08/13/23 0224 08/14/23 0217 08/15/23 0235  ALBUMIN 3.3* 3.2* 3.0*   No results for input(s): "LIPASE", "AMYLASE" in the last 168 hours. No results for input(s): "AMMONIA" in the last 168 hours. Coagulation Profile: Recent Labs  Lab 08/17/23 0228  INR 1.4*   Cardiac Enzymes: No results for input(s): "CKTOTAL", "CKMB", "CKMBINDEX", "TROPONINI" in the last 168 hours. BNP (last 3 results) Recent Labs    07/14/23 1412  PROBNP 11,269*   HbA1C: No results for input(s): "HGBA1C" in the last 72 hours. CBG: Recent Labs  Lab 08/18/23 1140 08/18/23 1611 08/18/23 2140 08/19/23 0819 08/19/23 1138  GLUCAP 173* 243* 262* 112* 129*   Lipid Profile: No results for input(s): "CHOL", "HDL", "LDLCALC", "TRIG", "CHOLHDL", "LDLDIRECT" in the last 72 hours. Thyroid Function Tests: No results for input(s): "TSH", "T4TOTAL", "FREET4", "T3FREE", "THYROIDAB" in the last 72 hours.   Anemia Panel: No results for input(s): "VITAMINB12", "FOLATE", "FERRITIN", "TIBC", "IRON", "RETICCTPCT" in the last 72 hours.  Urine analysis:    Component Value Date/Time   COLORURINE YELLOW 11/07/2020 0744   APPEARANCEUR  CLEAR 11/07/2020 0744   LABSPEC 1.011 11/07/2020 0744   PHURINE 5.0 11/07/2020 0744   GLUCOSEU 50 (A) 11/07/2020 0744   HGBUR SMALL (A) 11/07/2020 0744   BILIRUBINUR NEGATIVE 11/07/2020 0744   KETONESUR NEGATIVE 11/07/2020 0744   PROTEINUR 100 (A) 11/07/2020 0744   NITRITE NEGATIVE 11/07/2020 0744   LEUKOCYTESUR NEGATIVE 11/07/2020 0744   Sepsis Labs: @LABRCNTIP (procalcitonin:4,lacticidven:4)  )No results found for this or any previous visit (from the past 240 hours).   Radiology Studies: DG CHEST PORT 1 VIEW Result Date: 08/19/2023 CLINICAL DATA:  Persistent cough. EXAM: PORTABLE CHEST 1 VIEW COMPARISON:  Radiographs 08/11/2023 and 11/06/2020. Chest CT 12/08/2013. FINDINGS: 0953 hours. Right IJ hemodialysis catheter projects over the mid right atrium. The heart size and mediastinal contours are stable status post median sternotomy and CABG. Unchanged low lung volumes with mild vascular congestion. No overt pulmonary edema, confluent airspace disease, pneumothorax or significant pleural effusion. The bones appear unchanged. IMPRESSION: Unchanged low lung volumes with mild vascular congestion. No evidence of pneumonia or overt pulmonary edema. Electronically Signed   By: Carey Bullocks M.D.   On: 08/19/2023  11:59   VAS [place] UPPER EXT VEIN MAPPING (PRE-OP AVF) Result Date: 08/18/2023 UPPER EXTREMITY VEIN MAPPING Patient Name:  FAVOR HACKLER  Date of Exam:   08/18/2023 Medical Rec #: [id number]         Accession #:    [id number] Date of Birth: [birth year]        Patient Gender: M Patient Age:   30 years Exam Location:  [facility] Procedure:      VAS [place] UPPER EXT VEIN MAPPING (PRE-OP AVF) Referring Phys: Paulene Floor --------------------------------------------------------------------------------  Indications: Pre-op AVF. Comparison Study: None Performing Technologist: Shona Simpson  Examination Guidelines: A complete evaluation includes B-mode imaging, spectral Doppler, color Doppler, and  power Doppler as needed of all accessible portions of each vessel. Bilateral testing is considered an integral part of a complete examination. Limited examinations for reoccurring indications may be performed as noted. +-----------------+-------------+----------+---------+ Right Cephalic   Diameter (cm)Depth (cm)Findings  +-----------------+-------------+----------+---------+ Shoulder             0.27        1.20             +-----------------+-------------+----------+---------+ Prox upper arm       0.20        0.96             +-----------------+-------------+----------+---------+ Mid upper arm        0.18        0.36             +-----------------+-------------+----------+---------+ Dist upper arm       0.18        0.38             +-----------------+-------------+----------+---------+ Antecubital fossa    0.34        0.39   branching +-----------------+-------------+----------+---------+ Prox forearm         0.26        0.59   branching +-----------------+-------------+----------+---------+ Mid forearm          0.09        0.46             +-----------------+-------------+----------+---------+ Dist forearm         0.26        0.24             +-----------------+-------------+----------+---------+ Wrist                0.16        0.23             +-----------------+-------------+----------+---------+ +-----------------+-------------+----------+--------------+ Right Basilic    Diameter (cm)Depth (cm)   Findings    +-----------------+-------------+----------+--------------+ Shoulder                                not visualized +-----------------+-------------+----------+--------------+ Prox upper arm                          not visualized +-----------------+-------------+----------+--------------+ Mid upper arm                           not visualized +-----------------+-------------+----------+--------------+ Dist upper arm       0.44         1.10                  +-----------------+-------------+----------+--------------+ Antecubital fossa    0.37        0.46  branching    +-----------------+-------------+----------+--------------+ Prox forearm         0.28        0.35                  +-----------------+-------------+----------+--------------+ Mid forearm          0.23        0.34                  +-----------------+-------------+----------+--------------+ Distal forearm       0.15        0.20                  +-----------------+-------------+----------+--------------+ Wrist                0.13        0.29                  +-----------------+-------------+----------+--------------+ +-----------------+-------------+----------+---------+ Left Cephalic    Diameter (cm)Depth (cm)Findings  +-----------------+-------------+----------+---------+ Shoulder             0.30        1.36             +-----------------+-------------+----------+---------+ Prox upper arm       0.19        0.78             +-----------------+-------------+----------+---------+ Mid upper arm        0.21        0.33             +-----------------+-------------+----------+---------+ Dist upper arm       0.18        0.57   branching +-----------------+-------------+----------+---------+ Antecubital fossa    0.38        0.47   branching +-----------------+-------------+----------+---------+ Prox forearm         0.28        0.47             +-----------------+-------------+----------+---------+ Mid forearm          0.17        0.30   branching +-----------------+-------------+----------+---------+ Dist forearm         0.16        0.22             +-----------------+-------------+----------+---------+ Wrist                0.19        0.22             +-----------------+-------------+----------+---------+ +-----------------+-------------+----------+--------------+ Left Basilic     Diameter (cm)Depth (cm)    Findings    +-----------------+-------------+----------+--------------+ Shoulder                                not visualized +-----------------+-------------+----------+--------------+ Prox upper arm       0.35        1.20                  +-----------------+-------------+----------+--------------+ Mid upper arm        0.24        1.00     branching    +-----------------+-------------+----------+--------------+ Dist upper arm       0.26        0.71                  +-----------------+-------------+----------+--------------+ Antecubital fossa    0.31        0.63                  +-----------------+-------------+----------+--------------+  Prox forearm         0.16        0.28     branching    +-----------------+-------------+----------+--------------+ Mid forearm          0.96        0.23                  +-----------------+-------------+----------+--------------+ Distal forearm       0.09        0.25                  +-----------------+-------------+----------+--------------+ Wrist                0.07        0.17                  +-----------------+-------------+----------+--------------+ *See table(s) above for measurements and observations.  Diagnosing physician: Gerarda Fraction Electronically signed by Gerarda Fraction on 08/18/2023 at 5:34:02 PM.    Final    VAS [place] UPPER EXTREMITY ARTERIAL DUPLEX Result Date: 08/18/2023  UPPER EXTREMITY DUPLEX STUDY Patient Name:  GILES CURRIE  Date of Exam:   08/18/2023 Medical Rec #: [id number]         Accession #:    [id number] Date of Birth: 1955/07/23        Patient Gender: M Patient Age:   32 years Exam Location:  [facility] Procedure:      VAS [place] UPPER EXTREMITY ARTERIAL DUPLEX Referring Phys: EMMA COLLINS --------------------------------------------------------------------------------  Indications: AVF Pre-op.  Risk Factors: Hypertension, hyperlipidemia, Diabetes, prior MI. Limitations: None Comparison Study: None.  Performing Technologist: Shona Simpson  Examination Guidelines: A complete evaluation includes B-mode imaging, spectral Doppler, color Doppler, and power Doppler as needed of all accessible portions of each vessel. Bilateral testing is considered an integral part of a complete examination. Limited examinations for reoccurring indications may be performed as noted.  Right Doppler Findings: +--------------+----------+--------+--------+--------+ Site          PSV (cm/s)WaveformStenosisComments +--------------+----------+--------+--------+--------+ Subclavian Mid65                                 +--------------+----------+--------+--------+--------+ Axillary      52                                 +--------------+----------+--------+--------+--------+ Brachial Dist 64                                 +--------------+----------+--------+--------+--------+ Radial Dist   66                                 +--------------+----------+--------+--------+--------+ Ulnar Dist    66                                 +--------------+----------+--------+--------+--------+   Left Doppler Findings: +--------------+----------+--------+-------------+--------+ Site          PSV (cm/s)WaveformStenosis     Comments +--------------+----------+--------+-------------+--------+ Subclavian Mid66                                      +--------------+----------+--------+-------------+--------+ Axillary  129               <50% stenosis         +--------------+----------+--------+-------------+--------+ Brachial Dist 85                                      +--------------+----------+--------+-------------+--------+ Radial Dist   79                                      +--------------+----------+--------+-------------+--------+ Ulnar Dist    57                                      +--------------+----------+--------+-------------+--------+   Summary:  Left: <50% stenosis  visualized in the left upper extremity.       Obstruction noted in the axillary artery. *See table(s) above for measurements and observations. Electronically signed by Gerarda Fraction on 08/18/2023 at 5:33:51 PM.    Final    IR Fluoro Guide CV Line Right Result Date: 08/18/2023 CLINICAL DATA:  Renal failure, needs durable venous access for planned hemodialysis EXAM: TUNNELED HEMODIALYSIS CATHETER PLACEMENT WITH ULTRASOUND AND FLUOROSCOPIC GUIDANCE TECHNIQUE: The procedure, risks, benefits, and alternatives were explained to the patient. Questions regarding the procedure were encouraged and answered. The patient understands and consents to the procedure. As antibiotic prophylaxis, cefazolin 2 g was ordered pre-procedure and administered intravenously within one hour of incision.Patency of the right IJ vein was confirmed with ultrasound with image documentation. An appropriate skin site was determined. Region was prepped using maximum barrier technique including cap and mask, sterile gown, sterile gloves, large sterile sheet, and Chlorhexidine as cutaneous antisepsis. The region was infiltrated locally with 1% lidocaine. Intravenous Fentanyl and Versed 1mg  were administered by RN during a total moderate (conscious) sedation time of 10 minutes; the patient's level of consciousness and physiological / cardiorespiratory status were monitored continuously by radiology RN under my direct supervision. Under real-time ultrasound guidance, the right IJ vein was accessed with a 21 gauge micropuncture needle; the needle tip within the vein was confirmed with ultrasound image documentation. Needle exchanged over the 018 guidewire for transitional dilator, which allowed advancement of a Benson wire into the IVC. Over this, an MPA catheter was advanced. A Palindrome 19 hemodialysis catheter was tunneled from the right anterior chest wall approach to the right IJ dermatotomy site. The MPA catheter was exchanged over an  Amplatz wire for serial vascular dilators which allow placement of a peel-away sheath, through which the catheter was advanced under intermittent fluoroscopy, positioned with its tips in the proximal and midright atrium. Spot chest radiograph confirms good catheter position. No pneumothorax. Catheter was flushed and primed per protocol. Catheter secured externally with O Prolene sutures. The right IJ dermatotomy site was closed with Dermabond. COMPLICATIONS: COMPLICATIONS None immediate FLUOROSCOPY: Radiation Exposure Index (as provided by the fluoroscopic device): 5 mGy air Kerma COMPARISON:  None Available. IMPRESSION: 1. Technically successful placement of tunneled right IJ hemodialysis catheter with ultrasound and fluoroscopic guidance. Ready for routine use. ACCESS: Remains approachable for percutaneous intervention as needed. Electronically Signed   By: Corlis Leak M.D.   On: 08/18/2023 09:53   IR US Guide Vasc Access Right Result Date: 08/18/2023 CLINICAL DATA:  Renal failure, needs durable venous access for  planned hemodialysis EXAM: TUNNELED HEMODIALYSIS CATHETER PLACEMENT WITH ULTRASOUND AND FLUOROSCOPIC GUIDANCE TECHNIQUE: The procedure, risks, benefits, and alternatives were explained to the patient. Questions regarding the procedure were encouraged and answered. The patient understands and consents to the procedure. As antibiotic prophylaxis, cefazolin 2 g was ordered pre-procedure and administered intravenously within one hour of incision.Patency of the right IJ vein was confirmed with ultrasound with image documentation. An appropriate skin site was determined. Region was prepped using maximum barrier technique including cap and mask, sterile gown, sterile gloves, large sterile sheet, and Chlorhexidine as cutaneous antisepsis. The region was infiltrated locally with 1% lidocaine. Intravenous Fentanyl and Versed 1mg  were administered by RN during a total moderate (conscious) sedation time of 10  minutes; the patient's level of consciousness and physiological / cardiorespiratory status were monitored continuously by radiology RN under my direct supervision. Under real-time ultrasound guidance, the right IJ vein was accessed with a 21 gauge micropuncture needle; the needle tip within the vein was confirmed with ultrasound image documentation. Needle exchanged over the 018 guidewire for transitional dilator, which allowed advancement of a Benson wire into the IVC. Over this, an MPA catheter was advanced. A Palindrome 19 hemodialysis catheter was tunneled from the right anterior chest wall approach to the right IJ dermatotomy site. The MPA catheter was exchanged over an Amplatz wire for serial vascular dilators which allow placement of a peel-away sheath, through which the catheter was advanced under intermittent fluoroscopy, positioned with its tips in the proximal and midright atrium. Spot chest radiograph confirms good catheter position. No pneumothorax. Catheter was flushed and primed per protocol. Catheter secured externally with O Prolene sutures. The right IJ dermatotomy site was closed with Dermabond. COMPLICATIONS: COMPLICATIONS None immediate FLUOROSCOPY: Radiation Exposure Index (as provided by the fluoroscopic device): 5 mGy air Kerma COMPARISON:  None Available. IMPRESSION: 1. Technically successful placement of tunneled right IJ hemodialysis catheter with ultrasound and fluoroscopic guidance. Ready for routine use. ACCESS: Remains approachable for percutaneous intervention as needed. Electronically Signed   By: Corlis Leak M.D.   On: 08/18/2023 09:53      Scheduled Meds:  aspirin EC  81 mg Oral Daily   atorvastatin  20 mg Oral Daily   calcitRIOL  0.25 mcg Oral Daily   carvedilol  3.125 mg Oral BID   Chlorhexidine Gluconate Cloth  6 each Topical Q0600   clopidogrel  75 mg Oral Daily   cyanocobalamin  500 mcg Oral Daily   [START ON 08/22/2023] darbepoetin (ARANESP) injection -  NON-DIALYSIS  200 mcg Subcutaneous Q Fri-1800   gabapentin  300 mg Oral QHS   heparin injection (subcutaneous)  5,000 Units Subcutaneous Q8H   insulin aspart  0-6 Units Subcutaneous TID WC   insulin glargine  7 Units Subcutaneous QHS   isosorbide mononitrate  30 mg Oral Daily   latanoprost  1 drop Both Eyes QHS   levothyroxine  50 mcg Oral Daily   metolazone  5 mg Oral Daily   sevelamer carbonate  800 mg Oral TID WC   tamsulosin  0.4 mg Oral QPC breakfast   Continuous Infusions:  anticoagulant sodium citrate     [START ON 08/20/2023]  ceFAZolin (ANCEF) IV     furosemide 120 mg (08/19/23 0559)     LOS: 7 days    Time spent:    Zannie Cove, MD [facility]   08/19/2023, 12:20 PM

## 2023-08-20 ENCOUNTER — Encounter (HOSPITAL_COMMUNITY)
Admission: EM | Disposition: A | Discharge status: Home / Self Care | Payer: Self-pay | Source: Home / Self Care | Attending: Internal Medicine

## 2023-08-20 ENCOUNTER — Inpatient Hospital Stay (HOSPITAL_COMMUNITY): Admitting: Registered Nurse

## 2023-08-20 ENCOUNTER — Encounter (HOSPITAL_COMMUNITY): Payer: Self-pay | Admitting: Internal Medicine

## 2023-08-20 ENCOUNTER — Other Ambulatory Visit: Payer: Self-pay

## 2023-08-20 DIAGNOSIS — E440 Moderate protein-calorie malnutrition: Secondary | ICD-10-CM

## 2023-08-20 DIAGNOSIS — I132 Hypertensive heart and chronic kidney disease with heart failure and with stage 5 chronic kidney disease, or end stage renal disease: Secondary | ICD-10-CM | POA: Diagnosis not present

## 2023-08-20 DIAGNOSIS — I5042 Chronic combined systolic (congestive) and diastolic (congestive) heart failure: Secondary | ICD-10-CM

## 2023-08-20 DIAGNOSIS — Z992 Dependence on renal dialysis: Secondary | ICD-10-CM

## 2023-08-20 DIAGNOSIS — I5020 Unspecified systolic (congestive) heart failure: Secondary | ICD-10-CM | POA: Diagnosis not present

## 2023-08-20 DIAGNOSIS — N186 End stage renal disease: Secondary | ICD-10-CM | POA: Diagnosis not present

## 2023-08-20 DIAGNOSIS — N185 Chronic kidney disease, stage 5: Secondary | ICD-10-CM | POA: Diagnosis not present

## 2023-08-20 HISTORY — DX: Moderate protein-calorie malnutrition: E44.0

## 2023-08-20 HISTORY — PX: AV FISTULA PLACEMENT: SHX1204

## 2023-08-20 LAB — GLUCOSE, CAPILLARY
Glucose-Capillary: 119 mg/dL — ABNORMAL HIGH (ref 70–99)
Glucose-Capillary: 107 mg/dL — ABNORMAL HIGH (ref 70–99)
Glucose-Capillary: 104 mg/dL — ABNORMAL HIGH (ref 70–99)
Glucose-Capillary: 379 mg/dL — ABNORMAL HIGH (ref 70–99)
Glucose-Capillary: 296 mg/dL — ABNORMAL HIGH (ref 70–99)

## 2023-08-20 LAB — BASIC METABOLIC PANEL
Anion gap: 10 (ref 5–15)
BUN: 64 mg/dL — ABNORMAL HIGH (ref 8–23)
CO2: 30 mmol/L (ref 22–32)
Calcium: 8.5 mg/dL — ABNORMAL LOW (ref 8.9–10.3)
Chloride: 96 mmol/L — ABNORMAL LOW (ref 98–111)
Creatinine, Ser: 3.84 mg/dL — ABNORMAL HIGH (ref 0.61–1.24)
GFR, Estimated: 16 mL/min — ABNORMAL LOW (ref >60)
Glucose, Bld: 139 mg/dL — ABNORMAL HIGH (ref 70–99)
Potassium: 3.4 mmol/L — ABNORMAL LOW (ref 3.5–5.1)
Sodium: 136 mmol/L (ref 135–145)

## 2023-08-20 LAB — CBC
HCT: 30.3 % — ABNORMAL LOW (ref 39.0–52.0)
Hemoglobin: 10 g/dL — ABNORMAL LOW (ref 13.0–17.0)
MCH: 30.2 pg (ref 26.0–34.0)
MCHC: 33 g/dL (ref 30.0–36.0)
MCV: 91.5 fL (ref 80.0–100.0)
Platelets: 129 10*3/uL — ABNORMAL LOW (ref 150–400)
RBC: 3.31 MIL/uL — ABNORMAL LOW (ref 4.22–5.81)
RDW: 14.9 % (ref 11.5–15.5)
WBC: 7.7 10*3/uL (ref 4.0–10.5)
nRBC: 0 % (ref 0.0–0.2)

## 2023-08-20 SURGERY — ARTERIOVENOUS (AV) FISTULA CREATION
Anesthesia: General | Site: Arm Upper | Laterality: Left

## 2023-08-20 MED ORDER — PROPOFOL 10 MG/ML IV BOLUS
INTRAVENOUS | Status: AC
  Filled 2023-08-20: qty 20

## 2023-08-20 MED ORDER — LIDOCAINE 2% (20 MG/ML) 5 ML SYRINGE
INTRAMUSCULAR | Status: DC | PRN
  Administered 2023-08-20: 60 mg via INTRAVENOUS

## 2023-08-20 MED ORDER — PROPOFOL 10 MG/ML IV BOLUS
INTRAVENOUS | Status: DC | PRN
Start: 2023-08-20 — End: 2023-08-20
  Administered 2023-08-20: 150 mg via INTRAVENOUS

## 2023-08-20 MED ORDER — CHLORHEXIDINE GLUCONATE 0.12 % MT SOLN: 15.0000 mL | Freq: Once | OROMUCOSAL | Status: AC

## 2023-08-20 MED ORDER — FENTANYL CITRATE (PF) 250 MCG/5ML IJ SOLN
INTRAMUSCULAR | Status: AC
  Filled 2023-08-20: qty 5

## 2023-08-20 MED ORDER — INSULIN ASPART 100 UNIT/ML IJ SOLN: 0.0000 [IU] | INTRAMUSCULAR | Status: DC | PRN

## 2023-08-20 MED ORDER — ACETAMINOPHEN 10 MG/ML IV SOLN
INTRAVENOUS | Status: DC | PRN
  Administered 2023-08-20: 1000 mg via INTRAVENOUS

## 2023-08-20 MED ORDER — POTASSIUM CHLORIDE CRYS ER 20 MEQ PO TBCR
40.0000 meq | EXTENDED_RELEASE_TABLET | Freq: Once | ORAL | Status: AC
  Administered 2023-08-20: 40 meq via ORAL
  Filled 2023-08-20: qty 2

## 2023-08-20 MED ORDER — SODIUM CHLORIDE 0.9 % IV SOLN: INTRAVENOUS | Status: DC

## 2023-08-20 MED ORDER — DEXAMETHASONE SODIUM PHOSPHATE 10 MG/ML IJ SOLN
INTRAMUSCULAR | Status: DC | PRN
  Administered 2023-08-20: 5 mg via INTRAVENOUS

## 2023-08-20 MED ORDER — ONDANSETRON HCL 4 MG/2ML IJ SOLN
INTRAMUSCULAR | Status: DC | PRN
  Administered 2023-08-20: 4 mg via INTRAVENOUS

## 2023-08-20 MED ORDER — FENTANYL CITRATE (PF) 250 MCG/5ML IJ SOLN
INTRAMUSCULAR | Status: DC | PRN
Start: 2023-08-20 — End: 2023-08-20
  Administered 2023-08-20 (×2): 50 ug via INTRAVENOUS

## 2023-08-20 MED ORDER — ORAL CARE MOUTH RINSE: 15.0000 mL | Freq: Once | OROMUCOSAL | Status: AC

## 2023-08-20 MED ORDER — HEPARIN SODIUM (PORCINE) 1000 UNIT/ML IJ SOLN
INTRAMUSCULAR | Status: DC | PRN
  Administered 2023-08-20: 3000 [IU] via INTRAVENOUS

## 2023-08-20 MED ORDER — HEPARIN 6000 UNIT IRRIGATION SOLUTION
Status: DC | PRN
  Administered 2023-08-20: 1

## 2023-08-20 MED ORDER — 0.9 % SODIUM CHLORIDE (POUR BTL) OPTIME
TOPICAL | Status: DC | PRN
  Administered 2023-08-20: 1000 mL

## 2023-08-20 MED ORDER — HYDROCODONE-ACETAMINOPHEN 5-325 MG PO TABS
1.0000 | ORAL_TABLET | ORAL | Status: DC | PRN
  Administered 2023-08-23: 1 via ORAL
  Filled 2023-08-20: qty 1

## 2023-08-20 MED ORDER — CHLORHEXIDINE GLUCONATE 0.12 % MT SOLN
OROMUCOSAL | Status: AC
  Administered 2023-08-20: 15 mL via OROMUCOSAL
  Filled 2023-08-20: qty 15

## 2023-08-20 MED ORDER — EPHEDRINE SULFATE-NACL 50-0.9 MG/10ML-% IV SOSY
PREFILLED_SYRINGE | INTRAVENOUS | Status: DC | PRN
  Administered 2023-08-20: 5 mg via INTRAVENOUS

## 2023-08-20 MED ORDER — HEPARIN 6000 UNIT IRRIGATION SOLUTION
Status: AC
  Filled 2023-08-20: qty 500

## 2023-08-20 MED ORDER — PHENYLEPHRINE HCL-NACL 20-0.9 MG/250ML-% IV SOLN
INTRAVENOUS | Status: DC | PRN
  Administered 2023-08-20: 50 ug/min via INTRAVENOUS

## 2023-08-20 SURGICAL SUPPLY — 33 items
ARMBAND PINK RESTRICT EXTREMIT (MISCELLANEOUS) ×2 IMPLANT
BAG COUNTER SPONGE SURGICOUNT (BAG) ×1 IMPLANT
BLADE CLIPPER SURG (BLADE) ×1 IMPLANT
CANISTER SUCT 3000ML PPV (MISCELLANEOUS) ×1 IMPLANT
CLIP TI MEDIUM 6 (CLIP) ×1 IMPLANT
CLIP TI WIDE RED SMALL 6 (CLIP) ×1 IMPLANT
COVER PROBE W GEL 5X96 (DRAPES) ×1 IMPLANT
DERMABOND ADVANCED .7 DNX12 (GAUZE/BANDAGES/DRESSINGS) ×1 IMPLANT
ELECT REM PT RETURN 9FT ADLT (ELECTROSURGICAL) ×1 IMPLANT
ELECTRODE REM PT RTRN 9FT ADLT (ELECTROSURGICAL) ×1 IMPLANT
GLOVE BIO SURGEON STRL SZ 6 (GLOVE) IMPLANT
GLOVE BIO SURGEON STRL SZ7.5 (GLOVE) ×1 IMPLANT
GLOVE BIOGEL PI IND STRL 8 (GLOVE) ×1 IMPLANT
GOWN STRL REUS W/ TWL LRG LVL3 (GOWN DISPOSABLE) ×2 IMPLANT
GOWN STRL REUS W/ TWL XL LVL3 (GOWN DISPOSABLE) ×2 IMPLANT
HEMOSTAT SPONGE AVITENE ULTRA (HEMOSTASIS) IMPLANT
KIT BASIN OR (CUSTOM PROCEDURE TRAY) ×1 IMPLANT
KIT TURNOVER KIT B (KITS) ×1 IMPLANT
LOOP VESSEL MINI RED (MISCELLANEOUS) IMPLANT
NS IRRIG 1000ML POUR BTL (IV SOLUTION) ×1 IMPLANT
PACK CV ACCESS (CUSTOM PROCEDURE TRAY) ×1 IMPLANT
PAD ARMBOARD POSITIONER FOAM (MISCELLANEOUS) ×2 IMPLANT
SLING ARM FOAM STRAP LRG (SOFTGOODS) IMPLANT
SLING ARM FOAM STRAP MED (SOFTGOODS) IMPLANT
SPIKE FLUID TRANSFER (MISCELLANEOUS) ×1 IMPLANT
SUT MNCRL AB 4-0 PS2 18 (SUTURE) ×1 IMPLANT
SUT PROLENE 5 0 C 1 24 (SUTURE) IMPLANT
SUT PROLENE 6 0 BV (SUTURE) ×1 IMPLANT
SUT PROLENE 7 0 BV 1 (SUTURE) IMPLANT
SUT VIC AB 3-0 SH 27X BRD (SUTURE) ×1 IMPLANT
TOWEL GREEN STERILE (TOWEL DISPOSABLE) ×1 IMPLANT
UNDERPAD 30X36 HEAVY ABSORB (UNDERPADS AND DIAPERS) ×1 IMPLANT
WATER STERILE IRR 1000ML POUR (IV SOLUTION) ×1 IMPLANT

## 2023-08-20 NOTE — Progress Notes (Signed)
 [facility] NEPHROLOGY PROGRESS NOTE  Assessment/ Plan: Pt is a 68 y.o. yo male with HTN, DM, CKD 5 thought to be due to diabetic nephropathy followed by Dr. Janit Pagan at [facility] with a recent baseline creatinine level seems to be around 5, CHF with preserved EF, CAD status post CABG, presented with worsening dyspnea on exertion, lower extremity edema worsening for 3 to 4 weeks.    #Acute kidney injury on CKD 5 with fluid overload/CHF exacerbation now progressing to ESRD: CKD thought to be due to diabetic nephropathy, follows with nephrologist in [facility].  This sounds like progressive CKD.   Urine output is increasing with current high-dose IV diuretics and metolazone.  continued current diuretics especially since he still has significant lower extremity edema.  He was not interested in dialysis at least this admit- therefore palliative care team was consulted to address goals of care. He has a cousin and friend on dialysis and they "look terrible"; after continued education he was willing to start HD.  Appreciate RIJ TDC by VIR on 3/17, tolerated HD #1 3/17 w/ 1L net UF. HD #2 Tues 2L; patient has 1-2+ edema in lower extremities, next HD Thur on TTS regimen.  Appreciate Dr. Karin Lieu seeing the pt for access (likely surgery on Wednesday) and navigator involvement; CLIP in process.  Lives in [place] and referral submitted to [facility]. Quantiferon in process on 3/18.   #Acute on chronic CHF: Cardiology seeing,  diuretics.  Lasix 120 q 8 and metolazone 5 daily  stopped with decr UOP with HD.   #Hypertension/volume: Continue antihypertensives and volume management with diuretics.  Continue fluid restriction and low-sodium diet.   #Anemia of CKD: Iron saturation 16% therefore ordered IV iron Venofer 200 3/12-3/14) and Aranesp (120 3/14).     #CKD-MBD/hyperphosphatemia: PTH 332 - and started sevelamer as a phosphorus binders and low dose calcitriol. Recheck phos on thur.     Subjective: Seen and examined at bedside.  Urine output is recorded at 50.  Patient has some cough but denies any myalgias.  Tolerated dialysis yesterday; denies nausea, vomiting, dysphagia, chest pain or shortness of breath.  Has reluctantly accepted his fate.   Vitals:   08/19/23 1810 08/19/23 1933 08/19/23 2317 08/20/23 0429  BP: 139/79 130/64 118/78 135/72  Pulse:  84 89 88  Resp:  17 18 17   Temp:  99.4 F (37.4 C) 98.8 F (37.1 C) 99.5 F (37.5 C)  TempSrc:  Oral Oral Oral  SpO2:  98% 98% 97%  Weight:    86.6 kg  Height:       Weight change: -2.9 kg  Intake/Output Summary (Last 24 hours) at 08/20/2023 0836 Last data filed at 08/20/2023 0430 Gross per 24 hour  Intake --  Output 2050 ml  Net -2050 ml       Labs: RENAL PANEL Recent Labs  Lab 08/14/23 0217 08/15/23 0235 08/16/23 0324 08/17/23 0228 08/18/23 0301 08/19/23 0312 08/20/23 0448  NA 139 139 140 140 138 137 136  K 4.8 4.3 4.2 4.1 3.6 3.4* 3.4*  CL 106 105 104 104 101 98 96*  CO2 19* 21* 21* 23 24 28 30   GLUCOSE 114* 142* 129* 134* 128* 171* 139*  BUN 128* 135* 143* 142* 145* 101* 64*  CREATININE 6.07* 5.98* 6.09* 6.05* 6.07* 4.68* 3.84*  CALCIUM 7.6* 7.6* 8.1* 8.1* 8.0* 8.2* 8.5*  PHOS 6.1* 5.8*  --   --   --   --   --  ALBUMIN 3.2* 3.0*  --   --   --   --   --     Liver Function Tests: Recent Labs  Lab 08/14/23 0217 08/15/23 0235  ALBUMIN 3.2* 3.0*   No results for input(s): "LIPASE", "AMYLASE" in the last 168 hours. No results for input(s): "AMMONIA" in the last 168 hours. CBC: Recent Labs    08/13/23 0224 08/14/23 0217 08/15/23 0235 08/16/23 0324 08/19/23 0312 08/20/23 0448  HGB 8.5* 8.3* 7.9* 9.0* 8.9* 10.0*  MCV 92.4 91.6 90.6 91.4 90.6 91.5  FERRITIN 140  --   --   --   --   --   TIBC 308  --   --   --   --   --   IRON 50  --   --   --   --   --     Cardiac Enzymes: No results for input(s): "CKTOTAL", "CKMB", "CKMBINDEX", "TROPONINI" in the last 168 hours. CBG: Recent  Labs  Lab 08/19/23 0819 08/19/23 1138 08/19/23 1811 08/19/23 2055 08/20/23 0509  GLUCAP 112* 129* 107* 174* 119*    Iron Studies:  No results for input(s): "IRON", "TIBC", "TRANSFERRIN", "FERRITIN" in the last 72 hours.  Studies/Results: DG CHEST PORT 1 VIEW Result Date: 08/19/2023 CLINICAL DATA:  Persistent cough. EXAM: PORTABLE CHEST 1 VIEW COMPARISON:  Radiographs 08/11/2023 and 11/06/2020. Chest CT 12/08/2013. FINDINGS: 0953 hours. Right IJ hemodialysis catheter projects over the mid right atrium. The heart size and mediastinal contours are stable status post median sternotomy and CABG. Unchanged low lung volumes with mild vascular congestion. No overt pulmonary edema, confluent airspace disease, pneumothorax or significant pleural effusion. The bones appear unchanged. IMPRESSION: Unchanged low lung volumes with mild vascular congestion. No evidence of pneumonia or overt pulmonary edema. Electronically Signed   By: Carey Bullocks M.D.   On: 08/19/2023 11:59   VAS [place] UPPER EXT VEIN MAPPING (PRE-OP AVF) Result Date: 08/18/2023 UPPER EXTREMITY VEIN MAPPING Patient Name:  Phillip [name]  Date of Exam:   08/18/2023 Medical Rec #: [id number]         Accession #:    [id number] Date of Birth: 07/05/1955        Patient Gender: M Patient Age:   59 years Exam Location:  [facility] Procedure:      VAS [place] UPPER EXT VEIN MAPPING (PRE-OP AVF) Referring Phys: Paulene Floor --------------------------------------------------------------------------------  Indications: Pre-op AVF. Comparison Study: None Performing Technologist: Shona Simpson  Examination Guidelines: A complete evaluation includes B-mode imaging, spectral Doppler, color Doppler, and power Doppler as needed of all accessible portions of each vessel. Bilateral testing is considered an integral part of a complete examination. Limited examinations for reoccurring indications may be performed as noted.  +-----------------+-------------+----------+---------+ Right Cephalic   Diameter (cm)Depth (cm)Findings  +-----------------+-------------+----------+---------+ Shoulder             0.27        1.20             +-----------------+-------------+----------+---------+ Prox upper arm       0.20        0.96             +-----------------+-------------+----------+---------+ Mid upper arm        0.18        0.36             +-----------------+-------------+----------+---------+ Dist upper arm       0.18        0.38             +-----------------+-------------+----------+---------+  Antecubital fossa    0.34        0.39   branching +-----------------+-------------+----------+---------+ Prox forearm         0.26        0.59   branching +-----------------+-------------+----------+---------+ Mid forearm          0.09        0.46             +-----------------+-------------+----------+---------+ Dist forearm         0.26        0.24             +-----------------+-------------+----------+---------+ Wrist                0.16        0.23             +-----------------+-------------+----------+---------+ +-----------------+-------------+----------+--------------+ Right Basilic    Diameter (cm)Depth (cm)   Findings    +-----------------+-------------+----------+--------------+ Shoulder                                not visualized +-----------------+-------------+----------+--------------+ Prox upper arm                          not visualized +-----------------+-------------+----------+--------------+ Mid upper arm                           not visualized +-----------------+-------------+----------+--------------+ Dist upper arm       0.44        1.10                  +-----------------+-------------+----------+--------------+ Antecubital fossa    0.37        0.46     branching    +-----------------+-------------+----------+--------------+ Prox forearm          0.28        0.35                  +-----------------+-------------+----------+--------------+ Mid forearm          0.23        0.34                  +-----------------+-------------+----------+--------------+ Distal forearm       0.15        0.20                  +-----------------+-------------+----------+--------------+ Wrist                0.13        0.29                  +-----------------+-------------+----------+--------------+ +-----------------+-------------+----------+---------+ Left Cephalic    Diameter (cm)Depth (cm)Findings  +-----------------+-------------+----------+---------+ Shoulder             0.30        1.36             +-----------------+-------------+----------+---------+ Prox upper arm       0.19        0.78             +-----------------+-------------+----------+---------+ Mid upper arm        0.21        0.33             +-----------------+-------------+----------+---------+ Dist upper arm       0.18        0.57  branching +-----------------+-------------+----------+---------+ Antecubital fossa    0.38        0.47   branching +-----------------+-------------+----------+---------+ Prox forearm         0.28        0.47             +-----------------+-------------+----------+---------+ Mid forearm          0.17        0.30   branching +-----------------+-------------+----------+---------+ Dist forearm         0.16        0.22             +-----------------+-------------+----------+---------+ Wrist                0.19        0.22             +-----------------+-------------+----------+---------+ +-----------------+-------------+----------+--------------+ Left Basilic     Diameter (cm)Depth (cm)   Findings    +-----------------+-------------+----------+--------------+ Shoulder                                not visualized +-----------------+-------------+----------+--------------+ Prox upper arm       0.35         1.20                  +-----------------+-------------+----------+--------------+ Mid upper arm        0.24        1.00     branching    +-----------------+-------------+----------+--------------+ Dist upper arm       0.26        0.71                  +-----------------+-------------+----------+--------------+ Antecubital fossa    0.31        0.63                  +-----------------+-------------+----------+--------------+ Prox forearm         0.16        0.28     branching    +-----------------+-------------+----------+--------------+ Mid forearm          0.96        0.23                  +-----------------+-------------+----------+--------------+ Distal forearm       0.09        0.25                  +-----------------+-------------+----------+--------------+ Wrist                0.07        0.17                  +-----------------+-------------+----------+--------------+ *See table(s) above for measurements and observations.  Diagnosing physician: Gerarda Fraction Electronically signed by Gerarda Fraction on 08/18/2023 at 5:34:02 PM.    Final    VAS [place] UPPER EXTREMITY ARTERIAL DUPLEX Result Date: 08/18/2023  UPPER EXTREMITY DUPLEX STUDY Patient Name:  Phillip [name]  Date of Exam:   08/18/2023 Medical Rec #: [id number]         Accession #:    [id number] Date of Birth: 1956-02-26        Patient Gender: M Patient Age:   15 years Exam Location:  [facility] Procedure:      VAS [place] UPPER EXTREMITY ARTERIAL DUPLEX Referring Phys: EMMA COLLINS --------------------------------------------------------------------------------  Indications: AVF Pre-op.  Risk Factors: Hypertension,  hyperlipidemia, Diabetes, prior MI. Limitations: None Comparison Study: None. Performing Technologist: Shona Simpson  Examination Guidelines: A complete evaluation includes B-mode imaging, spectral Doppler, color Doppler, and power Doppler as needed of all accessible portions of each vessel. Bilateral  testing is considered an integral part of a complete examination. Limited examinations for reoccurring indications may be performed as noted.  Right Doppler Findings: +--------------+----------+--------+--------+--------+ Site          PSV (cm/s)WaveformStenosisComments +--------------+----------+--------+--------+--------+ Subclavian Mid65                                 +--------------+----------+--------+--------+--------+ Axillary      52                                 +--------------+----------+--------+--------+--------+ Brachial Dist 64                                 +--------------+----------+--------+--------+--------+ Radial Dist   66                                 +--------------+----------+--------+--------+--------+ Ulnar Dist    66                                 +--------------+----------+--------+--------+--------+   Left Doppler Findings: +--------------+----------+--------+-------------+--------+ Site          PSV (cm/s)WaveformStenosis     Comments +--------------+----------+--------+-------------+--------+ Subclavian Mid66                                      +--------------+----------+--------+-------------+--------+ Axillary      129               <50% stenosis         +--------------+----------+--------+-------------+--------+ Brachial Dist 85                                      +--------------+----------+--------+-------------+--------+ Radial Dist   79                                      +--------------+----------+--------+-------------+--------+ Ulnar Dist    57                                      +--------------+----------+--------+-------------+--------+   Summary:  Left: <50% stenosis visualized in the left upper extremity.       Obstruction noted in the axillary artery. *See table(s) above for measurements and observations. Electronically signed by Gerarda Fraction on 08/18/2023 at 5:33:51 PM.    Final      Medications: Infusions:  sodium chloride      ceFAZolin (ANCEF) IV     furosemide 120 mg (08/20/23 0539)    Scheduled Medications:  aspirin EC  81 mg Oral Daily   atorvastatin  20 mg Oral Daily   calcitRIOL  0.25 mcg Oral Daily   carvedilol  3.125 mg Oral BID  chlorhexidine  15 mL Mouth/Throat Once   Or   mouth rinse  15 mL Mouth Rinse Once   chlorhexidine       Chlorhexidine Gluconate Cloth  6 each Topical Q0600   clopidogrel  75 mg Oral Daily   cyanocobalamin  500 mcg Oral Daily   [START ON 08/22/2023] darbepoetin (ARANESP) injection - NON-DIALYSIS  200 mcg Subcutaneous Q Fri-1800   feeding supplement  237 mL Oral TID BM   gabapentin  300 mg Oral QHS   heparin injection (subcutaneous)  5,000 Units Subcutaneous Q8H   insulin aspart  0-6 Units Subcutaneous TID WC   insulin glargine  7 Units Subcutaneous QHS   isosorbide mononitrate  30 mg Oral Daily   latanoprost  1 drop Both Eyes QHS   levothyroxine  50 mcg Oral Daily   metolazone  5 mg Oral Daily   multivitamin  1 tablet Per Tube QHS   potassium chloride  40 mEq Oral Once   sevelamer carbonate  800 mg Oral TID WC   tamsulosin  0.4 mg Oral QPC breakfast    have reviewed scheduled and prn medications.  Physical Exam: General:NAD, comfortable Heart:RRR, s1s2 nl Lungs:clear b/l, no crackle Abdomen:soft, Non-tender, non-distended Extremities: Bilateral lower extremities edema++ Neurology: Alert, awake and following commands Access: RIJ TC  Pessy Delamar W 08/20/2023,8:36 AM  LOS: 8 days

## 2023-08-20 NOTE — Transfer of Care (Signed)
 Immediate Anesthesia Transfer of Care Note  Patient: Phillip [name]  Procedure(s) Performed: ARTERIOVENOUS (AV) FISTULA CREATION (Left: Arm Upper)  Patient Location: PACU  Anesthesia Type:General  Level of Consciousness: awake  Airway & Oxygen Therapy: Patient Spontanous Breathing  Post-op Assessment: Report given to RN and Post -op Vital signs reviewed and stable  Post vital signs: Reviewed and stable  Last Vitals:  Vitals Value Taken Time  BP 141/85 08/20/23 1149  Temp    Pulse 94 08/20/23 1152  Resp 20 08/20/23 1152  SpO2 92 % 08/20/23 1152  Vitals shown include unfiled device data.  Last Pain:  Vitals:   08/20/23 0859  TempSrc: Oral  PainSc: 0-No pain         Complications: No notable events documented.

## 2023-08-20 NOTE — Anesthesia Postprocedure Evaluation (Signed)
 Anesthesia Post Note  Patient: Phillip [name]  Procedure(s) Performed: ARTERIOVENOUS (AV) FISTULA CREATION (Left: Arm Upper)     Patient location during evaluation: PACU Anesthesia Type: General Level of consciousness: awake and alert Pain management: pain level controlled Vital Signs Assessment: post-procedure vital signs reviewed and stable Respiratory status: spontaneous breathing, nonlabored ventilation and respiratory function stable Cardiovascular status: blood pressure returned to baseline and stable Postop Assessment: no apparent nausea or vomiting Anesthetic complications: no   No notable events documented.  Last Vitals:  Vitals:   08/20/23 1217 08/20/23 1234  BP:  127/73  Pulse: 91   Resp: 13 18  Temp: 37 C 36.9 C  SpO2: 93% 91%    Last Pain:  Vitals:   08/20/23 1234  TempSrc: Oral  PainSc: 0-No pain                 Lowella Curb

## 2023-08-20 NOTE — Progress Notes (Addendum)
  Progress Note    08/20/2023 8:38 AM * Day of Surgery *  Subjective:  no complaints   Vitals:   08/19/23 2317 08/20/23 0429  BP: 118/78 135/72  Pulse: 89 88  Resp: 18 17  Temp: 98.8 F (37.1 C) 99.5 F (37.5 C)  SpO2: 98% 97%   Physical Exam: Lungs:  non labored Neurologic: a&O  CBC    Component Value Date/Time   WBC 7.7 08/20/2023 0448   RBC 3.31 (L) 08/20/2023 0448   HGB 10.0 (L) 08/20/2023 0448   HGB 11.5 (L) 05/16/2023 1429   HCT 30.3 (L) 08/20/2023 0448   HCT 35.3 (L) 05/16/2023 1429   PLT 129 (L) 08/20/2023 0448   PLT 144 (L) 05/16/2023 1429   MCV 91.5 08/20/2023 0448   MCV 90 05/16/2023 1429   MCH 30.2 08/20/2023 0448   MCHC 33.0 08/20/2023 0448   RDW 14.9 08/20/2023 0448   RDW 13.5 05/16/2023 1429   LYMPHSABS 0.6 (L) 08/11/2023 1933   LYMPHSABS 0.7 05/16/2023 1429   MONOABS 0.5 08/11/2023 1933   EOSABS 0.1 08/11/2023 1933   EOSABS 0.1 05/16/2023 1429   BASOSABS 0.0 08/11/2023 1933   BASOSABS 0.0 05/16/2023 1429    BMET    Component Value Date/Time   NA 136 08/20/2023 0448   NA 145 (H) 07/14/2023 1412   NA 136 08/30/2013 1025   K 3.4 (L) 08/20/2023 0448   K 3.7 08/30/2013 1025   CL 96 (L) 08/20/2023 0448   CL 101 08/30/2013 1025   CO2 30 08/20/2023 0448   CO2 31 08/30/2013 1025   GLUCOSE 139 (H) 08/20/2023 0448   GLUCOSE 310 (H) 08/30/2013 1025   BUN 64 (H) 08/20/2023 0448   BUN 97 (HH) 07/14/2023 1412   BUN 17 08/30/2013 1025   CREATININE 3.84 (H) 08/20/2023 0448   CREATININE 1.15 08/30/2013 1025   CALCIUM 8.5 (L) 08/20/2023 0448   CALCIUM 9.4 08/30/2013 1025   GFRNONAA 16 (L) 08/20/2023 0448   GFRNONAA >60 08/30/2013 1025   GFRAA 27 (L) 02/22/2020 0539   GFRAA >60 08/30/2013 1025    INR    Component Value Date/Time   INR 1.4 (H) 08/17/2023 0228     Intake/Output Summary (Last 24 hours) at 08/20/2023 6213 Last data filed at 08/20/2023 0430 Gross per 24 hour  Intake --  Output 2050 ml  Net -2050 ml     Assessment/Plan:   68 y.o. male with ESRD on HD  Plan is for L brachiobasilic fistula creation vs graft placement today in OR with Dr. Chestine Spore He is npo and consent has been signed All questions answered this morning   Emilie Rutter, PA-C [facility] [phone] 08/20/2023 8:38 AM  I have seen and evaluated the patient. I agree with the PA note as documented above.  Plan left arm AV fistula versus graft for end-stage renal disease.  Looks like he has had radial artery harvest for CABG and will be higher risk for steal.  He would prefer access in the left arm.  Vein mapping shows usable basilic and I discussed if we use this will require 2 stages.  Cephus Shelling, MD [facility] [place] Office: [phone]

## 2023-08-20 NOTE — Discharge Instructions (Signed)

## 2023-08-20 NOTE — Plan of Care (Signed)
  Problem: Coping: Goal: Level of anxiety will decrease Outcome: Not Progressing: pt paced room/hallway and remained restless around usual bedtime. RN spoken with pt to express and discuss concerns and angst. Pt verbalized upcoming procedure is contributing to restlessness among other things. Pt expressed gratitude at the opportunity to express feelings. Granddaughter remained at bedside for familial comfort.   Problem: Education: Goal: Knowledge of General Education information will improve Description: Including pain rating scale, medication(s)/side effects and non-pharmacologic comfort measures Outcome: Progressing   Problem: Clinical Measurements: Goal: Respiratory complications will improve Outcome: Progressing

## 2023-08-20 NOTE — Anesthesia Procedure Notes (Signed)
 Procedure Name: LMA Insertion Date/Time: 08/20/2023 10:37 AM  Performed by: Loleta Shabria Egley, CRNAPre-anesthesia Checklist: Patient identified, Patient being monitored, Timeout performed, Emergency Drugs available and Suction available Patient Re-evaluated:Patient Re-evaluated prior to induction Oxygen Delivery Method: Circle system utilized Preoxygenation: Pre-oxygenation with 100% oxygen Induction Type: IV induction Ventilation: Mask ventilation without difficulty LMA: LMA inserted LMA Size: 5.0 Tube type: Oral Number of attempts: 1 Placement Confirmation: positive ETCO2 and breath sounds checked- equal and bilateral Tube secured with: Tape Dental Injury: Teeth and Oropharynx as per pre-operative assessment

## 2023-08-20 NOTE — Progress Notes (Addendum)
 PROGRESS NOTE    Phillip [name]  ZOX:[id number] DOB: Aug 30, 1955 DOA: 08/11/2023 PCP: Gordan Payment., MD  67/M with hypertension, type 2 diabetes mellitus, CKD 5, CAD/CABG, chronic systolic CHF, hypothyroidism presented to the ED with worsening shortness of breath and swelling, with orthopnea.  Followed by nephrology in [facility], has been reluctant to consider dialysis. -Also recently underwent right and left heart cath 2/28 for TAVR workup. -In the ED noted to be significantly volume overloaded, hemoglobin 9.8, creatinine 6.1, BNP 685, chest x-ray with hypoinflation -Admitted, started on high-dose diuretics, seen by cards and nephrology, initially refused dialysis, now amenable -Cards signed off, recommended valve workup following stability with dialysis -3/17: Tunneled right IJ HD cath placed, started HD -3/18 plan for AVF  Subjective: -Reports mild cough, feels okay otherwise,, about to go down for AVF  Assessment and Plan:  AKI on CKD 5, progressive New ESRD -Baseline creatinine around 3-4, has been up to the 5 range recently, nephrology consulting, considered ESRD at this time -Diuresed with high-dose IV Lasix, 11.4 neg, now off -Finally after much back-and-forth has decided to move forward with starting HD, underwent tunneled HD catheter placement -Started HD 3/17, followed by 3/18 -Decreased gabapentin and Flexeril dose -Clip for outpatient HD pending, plan for AVF today  Acute on chronic systolic CHF -Echo 1/25 at [facility] with EF 30-35% with wall motion abnormality, grade 2 DD, low-flow low gradient severe aortic stenosis, severe pulmonary hypertension -Now on HD, not euvolemic yet, now off diuretics -Continue Coreg, Imdur  Severe aortic stenosis, low-flow low gradient -Noted on echo 1/25, seen by structural heart team and underwent right and left heart cath with plan to complete TAVR protocol  -Follow-up with structural heart team in few weeks    CAD Peripheral vascular disease Patient with prior history history of four-vessel CABG back in 2003.  Patient had left heart cath 07/2023 which showed patent LIMA to LAD but all 3 vein grafts occluded. -Continue aspirin, Plavix, beta-blocker, statin   Essential hypertension Blood pressures currently maintained 138/76 to 146/81 -Continue Coreg, cut down clonidine and Imdur dose   Diabetes mellitus type 2, with long-term use of insulin Last available hemoglobin A1c was 7.2 when last checked on 12/13 -Continue home long-acting insulin -SSI   Pancytopenia Chronic.,  Stable monitor   Hyperlipidemia -Continue atorvastatin   Hypothyroidism TSH was noted to be 3.99 last admit 06/2023 -Repeat TSH now is 7.9, free T4 was normal -Continue levothyroxine, recommend repeat labs in 4 to 6 weeks   BPH -Continue Flomax  DVT prophylaxis: Heparin  Advance Care Planning:   Code Status: Full Code   Consults: Cardiology and nephrology   Family Communication: Daughter at bedside yesterday Disposition: Pending stabilization of above issues, clip for HD   Consultants: Cards, nephrology   Procedures:   Antimicrobials:    Objective: Vitals:   08/20/23 0840 08/20/23 0859 08/20/23 1149 08/20/23 1200  BP: (!) 140/77 (!) 141/75 (!) 141/85 134/80  Pulse: 84 82  93  Resp: 18 18 12  (!) 22  Temp: 98.5 F (36.9 C) 99.2 F (37.3 C) 98.8 F (37.1 C)   TempSrc: Oral Oral    SpO2: 98% 97% 94% 95%  Weight:      Height:        Intake/Output Summary (Last 24 hours) at 08/20/2023 1212 Last data filed at 08/20/2023 1141 Gross per 24 hour  Intake 250 ml  Output 2055 ml  Net -1805 ml   Filed Weights   08/19/23  1400 08/19/23 1734 08/20/23 0429  Weight: 88.8 kg 86.8 kg 86.6 kg    Examination:  Gen: Awake, Alert, Oriented X 3,  HEENT: + JVD, right IJ HD catheter Lungs: Rare basilar Rales CVS: S1S2/RRR Abd: soft, Non tender, non distended, BS present Extremities: 1+ edema Skin: no new  rashes on exposed skin     Data Reviewed:   CBC: Recent Labs  Lab 08/14/23 0217 08/15/23 0235 08/16/23 0324 08/19/23 0312 08/20/23 0448  WBC 5.2 5.0 5.9 8.0 7.7  HGB 8.3* 7.9* 9.0* 8.9* 10.0*  HCT 25.1* 24.2* 27.5* 27.0* 30.3*  MCV 91.6 90.6 91.4 90.6 91.5  PLT 91* 91* 104* 122* 129*   Basic Metabolic Panel: Recent Labs  Lab 08/14/23 0217 08/15/23 0235 08/16/23 0324 08/17/23 0228 08/18/23 0301 08/19/23 0312 08/20/23 0448  NA 139 139 140 140 138 137 136  K 4.8 4.3 4.2 4.1 3.6 3.4* 3.4*  CL 106 105 104 104 101 98 96*  CO2 19* 21* 21* 23 24 28 30   GLUCOSE 114* 142* 129* 134* 128* 171* 139*  BUN 128* 135* 143* 142* 145* 101* 64*  CREATININE 6.07* 5.98* 6.09* 6.05* 6.07* 4.68* 3.84*  CALCIUM 7.6* 7.6* 8.1* 8.1* 8.0* 8.2* 8.5*  PHOS 6.1* 5.8*  --   --   --   --   --    GFR: Estimated Creatinine Clearance: 20.2 mL/min (A) (by C-G formula based on SCr of 3.84 mg/dL (H)). Liver Function Tests: Recent Labs  Lab 08/14/23 0217 08/15/23 0235  ALBUMIN 3.2* 3.0*   No results for input(s): "LIPASE", "AMYLASE" in the last 168 hours. No results for input(s): "AMMONIA" in the last 168 hours. Coagulation Profile: Recent Labs  Lab 08/17/23 0228  INR 1.4*   Cardiac Enzymes: No results for input(s): "CKTOTAL", "CKMB", "CKMBINDEX", "TROPONINI" in the last 168 hours. BNP (last 3 results) Recent Labs    07/14/23 1412  PROBNP 11,269*   HbA1C: No results for input(s): "HGBA1C" in the last 72 hours. CBG: Recent Labs  Lab 08/19/23 1811 08/19/23 2055 08/20/23 0509 08/20/23 0901 08/20/23 1155  GLUCAP 107* 174* 119* 107* 104*   Lipid Profile: No results for input(s): "CHOL", "HDL", "LDLCALC", "TRIG", "CHOLHDL", "LDLDIRECT" in the last 72 hours. Thyroid Function Tests: No results for input(s): "TSH", "T4TOTAL", "FREET4", "T3FREE", "THYROIDAB" in the last 72 hours.   Anemia Panel: No results for input(s): "VITAMINB12", "FOLATE", "FERRITIN", "TIBC", "IRON",  "RETICCTPCT" in the last 72 hours.  Urine analysis:    Component Value Date/Time   COLORURINE YELLOW 11/07/2020 0744   APPEARANCEUR CLEAR 11/07/2020 0744   LABSPEC 1.011 11/07/2020 0744   PHURINE 5.0 11/07/2020 0744   GLUCOSEU 50 (A) 11/07/2020 0744   HGBUR SMALL (A) 11/07/2020 0744   BILIRUBINUR NEGATIVE 11/07/2020 0744   KETONESUR NEGATIVE 11/07/2020 0744   PROTEINUR 100 (A) 11/07/2020 0744   NITRITE NEGATIVE 11/07/2020 0744   LEUKOCYTESUR NEGATIVE 11/07/2020 0744   Sepsis Labs: @LABRCNTIP (procalcitonin:4,lacticidven:4)  )No results found for this or any previous visit (from the past 240 hours).   Radiology Studies: DG CHEST PORT 1 VIEW Result Date: 08/19/2023 CLINICAL DATA:  Persistent cough. EXAM: PORTABLE CHEST 1 VIEW COMPARISON:  Radiographs 08/11/2023 and 11/06/2020. Chest CT 12/08/2013. FINDINGS: 0953 hours. Right IJ hemodialysis catheter projects over the mid right atrium. The heart size and mediastinal contours are stable status post median sternotomy and CABG. Unchanged low lung volumes with mild vascular congestion. No overt pulmonary edema, confluent airspace disease, pneumothorax or significant pleural effusion. The bones appear unchanged. IMPRESSION:  Unchanged low lung volumes with mild vascular congestion. No evidence of pneumonia or overt pulmonary edema. Electronically Signed   By: Carey Bullocks M.D.   On: 08/19/2023 11:59   VAS [place] UPPER EXT VEIN MAPPING (PRE-OP AVF) Result Date: 08/18/2023 UPPER EXTREMITY VEIN MAPPING Patient Name:  AAREN ATALLAH  Date of Exam:   08/18/2023 Medical Rec #: [id number]         Accession #:    [id number] Date of Birth: 1956/02/06        Patient Gender: M Patient Age:   79 years Exam Location:  [facility] Procedure:      VAS [place] UPPER EXT VEIN MAPPING (PRE-OP AVF) Referring Phys: Paulene Floor --------------------------------------------------------------------------------  Indications: Pre-op AVF. Comparison Study: None Performing  Technologist: Shona Simpson  Examination Guidelines: A complete evaluation includes B-mode imaging, spectral Doppler, color Doppler, and power Doppler as needed of all accessible portions of each vessel. Bilateral testing is considered an integral part of a complete examination. Limited examinations for reoccurring indications may be performed as noted. +-----------------+-------------+----------+---------+ Right Cephalic   Diameter (cm)Depth (cm)Findings  +-----------------+-------------+----------+---------+ Shoulder             0.27        1.20             +-----------------+-------------+----------+---------+ Prox upper arm       0.20        0.96             +-----------------+-------------+----------+---------+ Mid upper arm        0.18        0.36             +-----------------+-------------+----------+---------+ Dist upper arm       0.18        0.38             +-----------------+-------------+----------+---------+ Antecubital fossa    0.34        0.39   branching +-----------------+-------------+----------+---------+ Prox forearm         0.26        0.59   branching +-----------------+-------------+----------+---------+ Mid forearm          0.09        0.46             +-----------------+-------------+----------+---------+ Dist forearm         0.26        0.24             +-----------------+-------------+----------+---------+ Wrist                0.16        0.23             +-----------------+-------------+----------+---------+ +-----------------+-------------+----------+--------------+ Right Basilic    Diameter (cm)Depth (cm)   Findings    +-----------------+-------------+----------+--------------+ Shoulder                                not visualized +-----------------+-------------+----------+--------------+ Prox upper arm                          not visualized +-----------------+-------------+----------+--------------+ Mid upper arm                            not visualized +-----------------+-------------+----------+--------------+ Dist upper arm       0.44        1.10                  +-----------------+-------------+----------+--------------+  Antecubital fossa    0.37        0.46     branching    +-----------------+-------------+----------+--------------+ Prox forearm         0.28        0.35                  +-----------------+-------------+----------+--------------+ Mid forearm          0.23        0.34                  +-----------------+-------------+----------+--------------+ Distal forearm       0.15        0.20                  +-----------------+-------------+----------+--------------+ Wrist                0.13        0.29                  +-----------------+-------------+----------+--------------+ +-----------------+-------------+----------+---------+ Left Cephalic    Diameter (cm)Depth (cm)Findings  +-----------------+-------------+----------+---------+ Shoulder             0.30        1.36             +-----------------+-------------+----------+---------+ Prox upper arm       0.19        0.78             +-----------------+-------------+----------+---------+ Mid upper arm        0.21        0.33             +-----------------+-------------+----------+---------+ Dist upper arm       0.18        0.57   branching +-----------------+-------------+----------+---------+ Antecubital fossa    0.38        0.47   branching +-----------------+-------------+----------+---------+ Prox forearm         0.28        0.47             +-----------------+-------------+----------+---------+ Mid forearm          0.17        0.30   branching +-----------------+-------------+----------+---------+ Dist forearm         0.16        0.22             +-----------------+-------------+----------+---------+ Wrist                0.19        0.22              +-----------------+-------------+----------+---------+ +-----------------+-------------+----------+--------------+ Left Basilic     Diameter (cm)Depth (cm)   Findings    +-----------------+-------------+----------+--------------+ Shoulder                                not visualized +-----------------+-------------+----------+--------------+ Prox upper arm       0.35        1.20                  +-----------------+-------------+----------+--------------+ Mid upper arm        0.24        1.00     branching    +-----------------+-------------+----------+--------------+ Dist upper arm       0.26        0.71                  +-----------------+-------------+----------+--------------+  Antecubital fossa    0.31        0.63                  +-----------------+-------------+----------+--------------+ Prox forearm         0.16        0.28     branching    +-----------------+-------------+----------+--------------+ Mid forearm          0.96        0.23                  +-----------------+-------------+----------+--------------+ Distal forearm       0.09        0.25                  +-----------------+-------------+----------+--------------+ Wrist                0.07        0.17                  +-----------------+-------------+----------+--------------+ *See table(s) above for measurements and observations.  Diagnosing physician: Gerarda Fraction Electronically signed by Gerarda Fraction on 08/18/2023 at 5:34:02 PM.    Final    VAS [place] UPPER EXTREMITY ARTERIAL DUPLEX Result Date: 08/18/2023  UPPER EXTREMITY DUPLEX STUDY Patient Name:  ISAISH ALEMU  Date of Exam:   08/18/2023 Medical Rec #: [id number]         Accession #:    [id number] Date of Birth: 01-Oct-1955        Patient Gender: M Patient Age:   76 years Exam Location:  [facility] Procedure:      VAS [place] UPPER EXTREMITY ARTERIAL DUPLEX Referring Phys: EMMA COLLINS  --------------------------------------------------------------------------------  Indications: AVF Pre-op.  Risk Factors: Hypertension, hyperlipidemia, Diabetes, prior MI. Limitations: None Comparison Study: None. Performing Technologist: Shona Simpson  Examination Guidelines: A complete evaluation includes B-mode imaging, spectral Doppler, color Doppler, and power Doppler as needed of all accessible portions of each vessel. Bilateral testing is considered an integral part of a complete examination. Limited examinations for reoccurring indications may be performed as noted.  Right Doppler Findings: +--------------+----------+--------+--------+--------+ Site          PSV (cm/s)WaveformStenosisComments +--------------+----------+--------+--------+--------+ Subclavian Mid65                                 +--------------+----------+--------+--------+--------+ Axillary      52                                 +--------------+----------+--------+--------+--------+ Brachial Dist 64                                 +--------------+----------+--------+--------+--------+ Radial Dist   66                                 +--------------+----------+--------+--------+--------+ Ulnar Dist    66                                 +--------------+----------+--------+--------+--------+   Left Doppler Findings: +--------------+----------+--------+-------------+--------+ Site          PSV (cm/s)WaveformStenosis     Comments +--------------+----------+--------+-------------+--------+ Subclavian Mid66                                      +--------------+----------+--------+-------------+--------+  Axillary      129               <50% stenosis         +--------------+----------+--------+-------------+--------+ Brachial Dist 85                                      +--------------+----------+--------+-------------+--------+ Radial Dist   79                                       +--------------+----------+--------+-------------+--------+ Ulnar Dist    57                                      +--------------+----------+--------+-------------+--------+   Summary:  Left: <50% stenosis visualized in the left upper extremity.       Obstruction noted in the axillary artery. *See table(s) above for measurements and observations. Electronically signed by Gerarda Fraction on 08/18/2023 at 5:33:51 PM.    Final       Scheduled Meds:  [MAR Hold] aspirin EC  81 mg Oral Daily   [MAR Hold] atorvastatin  20 mg Oral Daily   [MAR Hold] calcitRIOL  0.25 mcg Oral Daily   [MAR Hold] carvedilol  3.125 mg Oral BID   [MAR Hold] Chlorhexidine Gluconate Cloth  6 each Topical Q0600   [MAR Hold] clopidogrel  75 mg Oral Daily   [MAR Hold] cyanocobalamin  500 mcg Oral Daily   [MAR Hold] darbepoetin (ARANESP) injection - NON-DIALYSIS  200 mcg Subcutaneous Q Fri-1800   [MAR Hold] feeding supplement  237 mL Oral TID BM   [MAR Hold] gabapentin  300 mg Oral QHS   [MAR Hold] heparin injection (subcutaneous)  5,000 Units Subcutaneous Q8H   [MAR Hold] insulin aspart  0-6 Units Subcutaneous TID WC   [MAR Hold] insulin glargine  7 Units Subcutaneous QHS   [MAR Hold] isosorbide mononitrate  30 mg Oral Daily   [MAR Hold] latanoprost  1 drop Both Eyes QHS   [MAR Hold] levothyroxine  50 mcg Oral Daily   [MAR Hold] multivitamin  1 tablet Per Tube QHS   [MAR Hold] potassium chloride  40 mEq Oral Once   [MAR Hold] sevelamer carbonate  800 mg Oral TID WC   [MAR Hold] tamsulosin  0.4 mg Oral QPC breakfast   Continuous Infusions:  sodium chloride 10 mL/hr at 08/20/23 1027     LOS: 8 days    Time spent:    Zannie Cove, MD [facility]   08/20/2023, 12:12 PM

## 2023-08-20 NOTE — Anesthesia Preprocedure Evaluation (Addendum)
 Anesthesia Evaluation  Patient identified by MRN, date of birth, ID band Patient awake    Reviewed: Allergy & Precautions, H&P , NPO status , Patient's Chart, lab work & pertinent test results  History of Anesthesia Complications Negative for: history of anesthetic complications  Airway Mallampati: II  TM Distance: >3 FB Neck ROM: full    Dental no notable dental hx. (+) Chipped, Poor Dentition, Missing   Pulmonary neg shortness of breath, asthma , sleep apnea , former smoker   Pulmonary exam normal breath sounds clear to auscultation       Cardiovascular Exercise Tolerance: Good hypertension, Pt. on medications (-) angina + CAD, + Peripheral Vascular Disease and +CHF  (-) Past MI and (-) DOE Normal cardiovascular exam Rhythm:Regular Rate:Normal     Neuro/Psych  Neuromuscular disease  negative psych ROS   GI/Hepatic Neg liver ROS,GERD  Medicated and Controlled,,  Endo/Other  diabetes, Type 2Hypothyroidism    Renal/GU ESRFRenal disease  negative genitourinary   Musculoskeletal  (+) Arthritis , Osteoarthritis,    Abdominal   Peds negative pediatric ROS (+)  Hematology  (+) Blood dyscrasia, anemia   Anesthesia Other Findings Past Medical History: No date: Asthma No date: CHF (congestive heart failure) (HCC) No date: Coronary artery disease No date: Diabetes mellitus without complication (HCC) No date: Foot ulcer (HCC)     Comment:  left foot No date: GERD (gastroesophageal reflux disease) No date: Heart murmur No date: Hypertension No date: Kidney insufficiency No date: Peripheral vascular disease [facility])  Past Surgical History: 03/13/2018: AMPUTATION TOE; Left     Comment:  Procedure: AMPUTATION TOE-MPJ;  Surgeon: Gwyneth Revels,              DPM;  Location: [facility] ORS;  Service: Podiatry;                Laterality: Left; No date: ANGIOPLASTY No date: CARDIAC CATHETERIZATION No date: CARDIAC SURGERY      Comment:  heart bypass (4) No date: CORONARY ARTERY BYPASS GRAFT 03/02/2018: LOWER EXTREMITY ANGIOGRAPHY; Left     Comment:  Procedure: LOWER EXTREMITY ANGIOGRAPHY;  Surgeon: Annice Needy, MD;  Location: [facility] INVASIVE CV LAB;  Service:               Cardiovascular;  Laterality: Left; No date: ROTATOR CUFF REPAIR; Left  BMI    Body Mass Index:  32.06 kg/m      Reproductive/Obstetrics negative OB ROS                             Anesthesia Physical Anesthesia Plan  ASA: IV  Anesthesia Plan: General   Post-op Pain Management:    Induction: Intravenous  PONV Risk Score and Plan: 2 and Ondansetron, Midazolam and Treatment may vary due to age or medical condition  Airway Management Planned: LMA  Additional Equipment:   Intra-op Plan:   Post-operative Plan: Extubation in OR  Informed Consent: I have reviewed the patients History and Physical, chart, labs and discussed the procedure including the risks, benefits and alternatives for the proposed anesthesia with the patient or authorized representative who has indicated his/her understanding and acceptance.     Dental Advisory Given  Plan Discussed with: Anesthesiologist, CRNA and Surgeon  Anesthesia Plan Comments: (Patient consented for risks of anesthesia including but not limited to:  - adverse reactions to medications - damage to teeth,  lips or other oral mucosa - sore throat or hoarseness - Damage to heart, brain, lungs or loss of life  Patient voiced understanding.)        Anesthesia Quick Evaluation

## 2023-08-20 NOTE — Op Note (Signed)
 OPERATIVE NOTE  DATE: August 20, 2023  PROCEDURE: left brachiocephalic arteriovenous fistula placement  PRE-OPERATIVE DIAGNOSIS: Acute on chronic kidney disease with progression to ESRD  POST-OPERATIVE DIAGNOSIS: same as above   SURGEON: Cephus Shelling, MD  ASSISTANT(S): Aggie Moats, PA  ANESTHESIA:  LMA  ESTIMATED BLOOD LOSS: <50 mL  FINDING(S): 1.  Cephalic vein: 4 mm, acceptable 2.  Brachial artery: 5 mm, atherosclerotic disease evident 3.  Venous outflow: palpable thrill  4.  Radial flow:  brisk doppler ulnar signal and monophasic radial signal  SPECIMEN(S):  none  INDICATIONS:   Phillip [name] is a 68 y.o. male who presents with acute on chronic kidney disease with progression to end-stage renal disease.  Patient needs permanent dialysis access.  The patient is scheduled for left arm arteriovenous fistula placement versus arteriovenous graft.  The patient is aware the risks include but are not limited to: bleeding, infection, steal syndrome, nerve damage, ischemic monomelic neuropathy, failure to mature, and need for additional procedures.  The patient is aware of the risks of the procedure and elects to proceed forward.  An assistant was needed given the complexity of the case and also to sew the anastomosis.   DESCRIPTION: After full informed written consent was obtained from the patient, the patient was brought back to the operating room and placed supine upon the operating table.  Prior to induction, the patient received IV antibiotics.   After obtaining adequate anesthesia, the patient was then prepped and draped in the standard fashion for a left arm access procedure.  I turned my attention first to identifying the patient's cephalic vein and brachial artery.  The cephalic vein actually looked to be of good caliber at the antecubitum and this was traced all the way up the arm and of usable caliber.  Ultimately elected for a brachiocephalic fistula.  I made a  transverse incision at the level of the antecubitum and dissected through the subcutaneous tissue and fascia to gain exposure of the brachial artery.  This was noted to be 5 mm in diameter externally.  This was dissected out proximally and distally and controlled with vessel loops .  I then dissected out the cephalic vein.  This was noted to be 4 mm in diameter externally.  The distal segment of the vein was ligated with a  2-0 silk, and the vein was transected.  The proximal segment was interrogated with serial dilators.  The vein accepted up to a 5 mm dilator without any difficulty.  I then instilled the heparinized saline into the vein and clamped it.  At this point, I reset my exposure of the brachial artery.  The patient was given 3000 units of IV heparin. I then placed the artery under tension proximally and distally.  I made an arteriotomy with a #11 blade, and then I extended the arteriotomy with a Potts scissor.  I injected heparinized saline proximal and distal to this arteriotomy.  The vein was then sewn to the artery in an end-to-side configuration with a running stitch of 6-0 Prolene with the help of my assistant.  Prior to completing this anastomosis, I allowed the vein and artery to backbleed.  There was no evidence of clot from any vessels.  I completed the anastomosis in the usual fashion and then released all vessel loops and clamps.    There was a palpable thrill in the venous outflow, and there was a  brisk multiphasic ulnar Doppler signal and monophasic radial Doppler signal  consistent with his prior exam. .  At this point, I irrigated out the surgical wound.  There was no further active bleeding.  The subcutaneous tissue was reapproximated with a running stitch of 3-0 Vicryl.  The skin was then reapproximated with a running subcuticular stitch of 4-0 Vicryl.  The skin was then cleaned, dried, and reinforced with Dermabond.  The patient tolerated this procedure well.   COMPLICATIONS:  None  CONDITION: Stable  Cephus Shelling, MD [facility] Office: [phone]  Cephus Shelling   08/20/2023, 11:38 AM

## 2023-08-20 NOTE — Progress Notes (Signed)
 Out-pt HD clinic placement pending. Clinic requires TB lab results prior to accepting pt. Lab currently pending. Will assist as needed.   Olivia Canter Renal Navigator [phone]

## 2023-08-21 ENCOUNTER — Encounter (HOSPITAL_COMMUNITY): Payer: Self-pay | Admitting: Vascular Surgery

## 2023-08-21 DIAGNOSIS — I350 Nonrheumatic aortic (valve) stenosis: Secondary | ICD-10-CM | POA: Diagnosis not present

## 2023-08-21 DIAGNOSIS — I5020 Unspecified systolic (congestive) heart failure: Secondary | ICD-10-CM | POA: Diagnosis not present

## 2023-08-21 DIAGNOSIS — N179 Acute kidney failure, unspecified: Secondary | ICD-10-CM | POA: Diagnosis not present

## 2023-08-21 LAB — GLUCOSE, CAPILLARY
Glucose-Capillary: 160 mg/dL — ABNORMAL HIGH (ref 70–99)
Glucose-Capillary: 277 mg/dL — ABNORMAL HIGH (ref 70–99)
Glucose-Capillary: 143 mg/dL — ABNORMAL HIGH (ref 70–99)
Glucose-Capillary: 176 mg/dL — ABNORMAL HIGH (ref 70–99)

## 2023-08-21 LAB — CBC
HCT: 30.8 % — ABNORMAL LOW (ref 39.0–52.0)
Hemoglobin: 10 g/dL — ABNORMAL LOW (ref 13.0–17.0)
MCH: 30.7 pg (ref 26.0–34.0)
MCHC: 32.5 g/dL (ref 30.0–36.0)
MCV: 94.5 fL (ref 80.0–100.0)
Platelets: 130 10*3/uL — ABNORMAL LOW (ref 150–400)
RBC: 3.26 MIL/uL — ABNORMAL LOW (ref 4.22–5.81)
RDW: 15.3 % (ref 11.5–15.5)
WBC: 9.4 10*3/uL (ref 4.0–10.5)
nRBC: 0 % (ref 0.0–0.2)

## 2023-08-21 LAB — BASIC METABOLIC PANEL
Anion gap: 12 (ref 5–15)
BUN: 75 mg/dL — ABNORMAL HIGH (ref 8–23)
CO2: 28 mmol/L (ref 22–32)
Calcium: 8.7 mg/dL — ABNORMAL LOW (ref 8.9–10.3)
Chloride: 96 mmol/L — ABNORMAL LOW (ref 98–111)
Creatinine, Ser: 4.6 mg/dL — ABNORMAL HIGH (ref 0.61–1.24)
GFR, Estimated: 13 mL/min — ABNORMAL LOW (ref >60)
Glucose, Bld: 202 mg/dL — ABNORMAL HIGH (ref 70–99)
Potassium: 4.3 mmol/L (ref 3.5–5.1)
Sodium: 136 mmol/L (ref 135–145)

## 2023-08-21 MED ORDER — HEPARIN SODIUM (PORCINE) 1000 UNIT/ML IJ SOLN
3200.0000 [IU] | Freq: Once | INTRAMUSCULAR | Status: AC
  Administered 2023-08-21: 3200 [IU]
  Filled 2023-08-21: qty 4

## 2023-08-21 NOTE — Progress Notes (Addendum)
   08/21/23 1825  Vitals  Temp 98.5 F (36.9 C)  Pulse Rate 94  Resp 16  BP (!) 147/77  SpO2 100 %  O2 Device Room Air  Weight 85.9 kg  Type of Weight Post-Dialysis  Oxygen Therapy  Patient Activity (if Appropriate) In bed  Pulse Oximetry Type Continuous  Oximetry Probe Site Changed No  Post Treatment  Dialyzer Clearance Lightly streaked  Hemodialysis Intake (mL) 100 mL  Liters Processed 82  Fluid Removed (mL) 1300 mL  Tolerated HD Treatment Yes  Post-Hemodialysis Comments Pt ended treatment 5 minutes early due to cramping.    Received patient in bed to unit.  Alert and oriented.  Informed consent signed and in chart.   TX duration: Three hours and twenty-five minutes  Patient tolerated well.  Transported back to the room  Alert, without acute distress.  Hand-off given to patient's nurse.   Access used: Right chest catheter Access issues: none

## 2023-08-21 NOTE — Progress Notes (Signed)
 PROGRESS NOTE    Phillip [name]  ZOX:[id number] DOB: [birth year] DOA: 08/11/2023 PCP: Gordan Payment., MD   Brief HPI: 67/M with hypertension, type 2 diabetes mellitus, CKD 5, CAD/CABG, chronic systolic CHF, hypothyroidism presented to the ED with worsening shortness of breath and swelling, with orthopnea.  Followed by nephrology in [facility], has been reluctant to consider dialysis. -Also recently underwent right and left heart cath 2/28 for TAVR workup. -In the ED noted to be significantly volume overloaded, hemoglobin 9.8, creatinine 6.1, BNP 685, chest x-ray with hypoinflation -Admitted, started on high-dose diuretics, seen by cards and nephrology, initially refused dialysis, now amenable -Cards signed off, recommended valve workup following stability with dialysis -3/17: Tunneled right IJ HD cath placed, started HD -3/18 AVF creation  Subjective: Patient feels well.  Denies any chest pain shortness of breath.  Wants to go home.  Assessment and Plan:  AKI on CKD 5, progressive New ESRD -Baseline creatinine around 3-4, has been up to the 5 range recently, nephrology consulting, considered ESRD at this time -Diuresed with high-dose IV Lasix, 11.4 neg, now off -Finally after much back-and-forth has decided to move forward with starting HD, underwent tunneled HD catheter placement -Started HD 3/17, followed by 3/18 -Decreased gabapentin and Flexeril dose -Clip for outpatient HD pending,  Underwent creation of AV fistula by vascular surgery.  Acute on chronic systolic CHF -Echo 1/25 at [facility] with EF 30-35% with wall motion abnormality, grade 2 DD, low-flow low gradient severe aortic stenosis, severe pulmonary hypertension -Now on HD, not euvolemic yet, now off diuretics -Continue Coreg, Imdur Volume management with dialysis.  Severe aortic stenosis, low-flow low gradient -Noted on echo 1/25, seen by structural heart team and underwent right and left heart cath with  plan to complete TAVR protocol  -Follow-up with structural heart team in few weeks   CAD Peripheral vascular disease Patient with prior history history of four-vessel CABG back in 2003.  Patient had left heart cath 07/2023 which showed patent LIMA to LAD but all 3 vein grafts occluded. -Continue aspirin, Plavix, beta-blocker, statin   Essential hypertension Blood pressure reasonably well-controlled. Prior to admission patient was on amlodipine, clonidine, carvedilol, isosorbide mononitrate. Currently noted to be on carvedilol, isosorbide mononitrate.  It appears that the amlodipine and clonidine are currently on hold.  Monitor for rebound hypertension.  Diabetes mellitus type 2, with long-term use of insulin Last available hemoglobin A1c was 7.2 when last checked on 12/13 Noted to be on SSI and glargine.  Monitor CBGs.   Pancytopenia Chronic.,  Stable monitor   Hyperlipidemia -Continue atorvastatin   Hypothyroidism TSH was noted to be 3.99 last admit 06/2023 -Repeat TSH now is 7.9, free T4 was normal -Continue levothyroxine, recommend repeat labs in 4 to 6 weeks   BPH -Continue Flomax  DVT prophylaxis: Heparin  Advance Care Planning:   Code Status: Full Code   Consults: Cardiology and nephrology   Family Communication: Daughter at bedside yesterday Disposition: Pending stabilization of above issues, clip for HD   Consultants: Cards, nephrology.  Vascular surgery   Objective: Vitals:   08/21/23 0422 08/21/23 0423 08/21/23 0424 08/21/23 0820  BP: (!) 149/71   (!) 144/78  Pulse: 94 91 87   Resp: 18   19  Temp: 98.3 F (36.8 C)     TempSrc: Oral   Oral  SpO2: 99% 95% 98%   Weight: 87.5 kg     Height:        Intake/Output Summary (Last 24  hours) at 08/21/2023 1109 Last data filed at 08/21/2023 0900 Gross per 24 hour  Intake 380 ml  Output 1405 ml  Net -1025 ml   Filed Weights   08/19/23 1734 08/20/23 0429 08/21/23 0422  Weight: 86.8 kg 86.6 kg 87.5 kg     Examination:  General appearance: Awake alert.  In no distress Resp: Clear to auscultation bilaterally.  Normal effort Cardio: S1-S2 is normal regular.  No S3-S4.  No rubs murmurs or bruit GI: Abdomen is soft.  Nontender nondistended.  Bowel sounds are present normal.  No masses organomegaly     Data Reviewed:   CBC: Recent Labs  Lab 08/15/23 0235 08/16/23 0324 08/19/23 0312 08/20/23 0448  WBC 5.0 5.9 8.0 7.7  HGB 7.9* 9.0* 8.9* 10.0*  HCT 24.2* 27.5* 27.0* 30.3*  MCV 90.6 91.4 90.6 91.5  PLT 91* 104* 122* 129*   Basic Metabolic Panel: Recent Labs  Lab 08/15/23 0235 08/16/23 0324 08/17/23 0228 08/18/23 0301 08/19/23 0312 08/20/23 0448 08/21/23 0249  NA 139   < > 140 138 137 136 136  K 4.3   < > 4.1 3.6 3.4* 3.4* 4.3  CL 105   < > 104 101 98 96* 96*  CO2 21*   < > 23 24 28 30 28   GLUCOSE 142*   < > 134* 128* 171* 139* 202*  BUN 135*   < > 142* 145* 101* 64* 75*  CREATININE 5.98*   < > 6.05* 6.07* 4.68* 3.84* 4.60*  CALCIUM 7.6*   < > 8.1* 8.0* 8.2* 8.5* 8.7*  PHOS 5.8*  --   --   --   --   --   --    < > = values in this interval not displayed.   GFR: Estimated Creatinine Clearance: 16.9 mL/min (A) (by C-G formula based on SCr of 4.6 mg/dL (H)). Liver Function Tests: Recent Labs  Lab 08/15/23 0235  ALBUMIN 3.0*    Coagulation Profile: Recent Labs  Lab 08/17/23 0228  INR 1.4*    CBG: Recent Labs  Lab 08/20/23 0901 08/20/23 1155 08/20/23 1639 08/20/23 2203 08/21/23 0605  GLUCAP 107* 104* 379* 296* 160*    Radiology Studies: No results found.  Scheduled Meds:  aspirin EC  81 mg Oral Daily   atorvastatin  20 mg Oral Daily   calcitRIOL  0.25 mcg Oral Daily   carvedilol  3.125 mg Oral BID   Chlorhexidine Gluconate Cloth  6 each Topical Q0600   clopidogrel  75 mg Oral Daily   cyanocobalamin  500 mcg Oral Daily   [START ON 08/22/2023] darbepoetin (ARANESP) injection - NON-DIALYSIS  200 mcg Subcutaneous Q Fri-1800   feeding supplement   237 mL Oral TID BM   gabapentin  300 mg Oral QHS   heparin injection (subcutaneous)  5,000 Units Subcutaneous Q8H   insulin aspart  0-6 Units Subcutaneous TID WC   insulin glargine  7 Units Subcutaneous QHS   isosorbide mononitrate  30 mg Oral Daily   latanoprost  1 drop Both Eyes QHS   levothyroxine  50 mcg Oral Daily   multivitamin  1 tablet Per Tube QHS   sevelamer carbonate  800 mg Oral TID WC   tamsulosin  0.4 mg Oral QPC breakfast   Continuous Infusions:     LOS: 9 days    [employer]  [facility]   08/21/2023, 11:09 AM

## 2023-08-21 NOTE — Inpatient Diabetes Management (Signed)
 Inpatient Diabetes Program Recommendations  AACE/ADA: New Consensus Statement on Inpatient Glycemic Control (2015)  Target Ranges:  Prepandial:   less than 140 mg/dL      Peak postprandial:   less than 180 mg/dL (1-2 hours)      Critically ill patients:  140 - 180 mg/dL   Lab Results  Component Value Date   GLUCAP 160 (H) 08/21/2023   HGBA1C 7.2 (H) 05/16/2023    Review of Glycemic Control  Latest Reference Range & Units 08/19/23 18:11 08/19/23 20:55 08/20/23 05:09 08/20/23 09:01 08/20/23 11:55 08/20/23 16:39 08/20/23 22:03 08/21/23 06:05  Glucose-Capillary 70 - 99 mg/dL 161 (H) 096 (H) 045 (H) 107 (H) 104 (H) 379 (H) 296 (H) 160 (H)   Diabetes history: DM  Outpatient Diabetes medications:  Basaglar 7 units q HS Current orders for Inpatient glycemic control:  Novolog 0-6 units tid with meals Lantus 7 units daily  Inpatient Diabetes Program Recommendations:    Patient received Decadron 5 mg x1 yesterday which likely increased blood sugars overnight.  Do not recommend any changes in insulin doses.   Thanks , Lorenza Cambridge, RN, BC-ADM Inpatient Diabetes Coordinator Pager [phone]  (8a-5p)

## 2023-08-21 NOTE — Progress Notes (Signed)
 TB lab still pending. Clinic requires this lab result prior to pt being accepted at clinic. Will provide result to clinic as soon as it is available. Will assist as needed.   Olivia Canter Renal Navigator [phone]

## 2023-08-21 NOTE — Progress Notes (Addendum)
  Progress Note    08/21/2023 8:01 AM 1 Day Post-Op  Subjective:  some incisional soreness in left AC   Vitals:   08/21/23 0423 08/21/23 0424  BP:    Pulse: 91 87  Resp:    Temp:    SpO2: 95% 98%   Physical Exam: Cardiac:  regular Lungs: non labored Incisions:  left AC incision is c/d/I without swelling or hematoma; good thrill Neurologic: alert and oriented  CBC    Component Value Date/Time   WBC 7.7 08/20/2023 0448   RBC 3.31 (L) 08/20/2023 0448   HGB 10.0 (L) 08/20/2023 0448   HGB 11.5 (L) 05/16/2023 1429   HCT 30.3 (L) 08/20/2023 0448   HCT 35.3 (L) 05/16/2023 1429   PLT 129 (L) 08/20/2023 0448   PLT 144 (L) 05/16/2023 1429   MCV 91.5 08/20/2023 0448   MCV 90 05/16/2023 1429   MCH 30.2 08/20/2023 0448   MCHC 33.0 08/20/2023 0448   RDW 14.9 08/20/2023 0448   RDW 13.5 05/16/2023 1429   LYMPHSABS 0.6 (L) 08/11/2023 1933   LYMPHSABS 0.7 05/16/2023 1429   MONOABS 0.5 08/11/2023 1933   EOSABS 0.1 08/11/2023 1933   EOSABS 0.1 05/16/2023 1429   BASOSABS 0.0 08/11/2023 1933   BASOSABS 0.0 05/16/2023 1429    BMET    Component Value Date/Time   NA 136 08/21/2023 0249   NA 145 (H) 07/14/2023 1412   NA 136 08/30/2013 1025   K 4.3 08/21/2023 0249   K 3.7 08/30/2013 1025   CL 96 (L) 08/21/2023 0249   CL 101 08/30/2013 1025   CO2 28 08/21/2023 0249   CO2 31 08/30/2013 1025   GLUCOSE 202 (H) 08/21/2023 0249   GLUCOSE 310 (H) 08/30/2013 1025   BUN 75 (H) 08/21/2023 0249   BUN 97 (HH) 07/14/2023 1412   BUN 17 08/30/2013 1025   CREATININE 4.60 (H) 08/21/2023 0249   CREATININE 1.15 08/30/2013 1025   CALCIUM 8.7 (L) 08/21/2023 0249   CALCIUM 9.4 08/30/2013 1025   GFRNONAA 13 (L) 08/21/2023 0249   GFRNONAA >60 08/30/2013 1025   GFRAA 27 (L) 02/22/2020 0539   GFRAA >60 08/30/2013 1025    INR    Component Value Date/Time   INR 1.4 (H) 08/17/2023 0228     Intake/Output Summary (Last 24 hours) at 08/21/2023 0801 Last data filed at 08/21/2023 0038 Gross per  24 hour  Intake 260 ml  Output 1405 ml  Net -1145 ml     Assessment/Plan:  68 y.o. male is s/p left BC AV fistula 1 Day Post-Op   Great thrill in fistula Incision is intact and well appearing without swelling or hematoma No signs or symptoms of steal He will follow up in our office in 4-6 weeks with fistula duplex   Graceann Congress, PA-C [facility] [phone] 08/21/2023 8:01 AM  I have seen and evaluated the patient. I agree with the PA note as documented above.  Postop day 1 status post left brachiocephalic AV fistula.  Fistula has an excellent thrill.  Incision looks good.  No signs or symptoms of steal.  Discussed we will arrange follow-up in 4 to 6 weeks with fistula duplex.  Can use the fistula in 3 months hopefully.  Call vascular with questions or concerns.  Cephus Shelling, MD [facility] [place] Office: [phone]

## 2023-08-21 NOTE — Progress Notes (Signed)
 Mobility Specialist Progress Note:    08/21/23 1202  Mobility  Activity Ambulated with assistance in hallway  Level of Assistance Contact guard assist, steadying assist  Assistive Device Cane  Distance Ambulated (ft) 200 ft  Activity Response Tolerated well  Mobility Referral Yes  Mobility visit 1 Mobility  Mobility Specialist Start Time (ACUTE ONLY) 1016  Mobility Specialist Stop Time (ACUTE ONLY) 1028  Mobility Specialist Time Calculation (min) (ACUTE ONLY) 12 min   Received pt in chair having no complaints and agreeable to mobility. Pt was asymptomatic throughout ambulation and returned to room w/o fault. Left in chair w/ call bell in reach and all needs met.   Thompson Grayer Mobility Specialist  Please contact vis Secure Chat or  Rehab Office [phone]

## 2023-08-21 NOTE — Plan of Care (Signed)

## 2023-08-21 NOTE — Progress Notes (Signed)
 [facility] NEPHROLOGY PROGRESS NOTE  Assessment/ Plan: Pt is a 68 y.o. yo male with HTN, DM, CKD 5 thought to be due to diabetic nephropathy followed by Dr. Janit Pagan at [facility] with a recent baseline creatinine level seems to be around 5, CHF with preserved EF, CAD status post CABG, presented with worsening dyspnea on exertion, lower extremity edema worsening for 3 to 4 weeks.    #Acute kidney injury on CKD 5 with fluid overload/CHF exacerbation now progressing to ESRD: CKD thought to be due to diabetic nephropathy, follows with nephrologist in [facility].  This sounds like progressive CKD.   Urine output is increasing with current high-dose IV diuretics and metolazone.  continued current diuretics especially since he still has significant lower extremity edema.  He was not interested in dialysis at least this admit- therefore palliative care team was consulted to address goals of care. He has a cousin and friend on dialysis and they "look terrible"; after continued education he was willing to start HD.  Appreciate RIJ TDC by VIR on 3/17, tolerated HD #1 3/17 w/ 1L net UF. HD #2 Tues 2L; patient has 1+ edema in lower extremities, next HD today on TTS regimen.  Appreciate Dr. Chestine Spore placing the left BCF on 3/19 (great thrill and bruit); CLIP in process.  Lives in [place] and referral submitted to [facility]. Quantiferon still in process (sent 3/18)   #Acute on chronic CHF: Cardiology seeing,  diuretics.  Lasix 120 q 8 and metolazone 5 daily  stopped with decr UOP with HD.   #Hypertension/volume: Continue antihypertensives and volume management with diuretics.  Continue fluid restriction and low-sodium diet.   #Anemia of CKD: Iron saturation 16% therefore ordered IV iron Venofer 200 3/12-3/14) and Aranesp (120 3/14).     #CKD-MBD/hyperphosphatemia: PTH 332 - and started sevelamer as a phosphorus binders and low dose calcitriol. Recheck phos on thur w dialysis (order written).     Subjective: Seen and examined at bedside.  Urine output 1.4L.   Patient has some cough but denies any myalgias.  Tolerated dialysis yesterday; denies nausea, vomiting, dysphagia, chest pain or shortness of breath.  Has reluctantly accepted his fate.   Vitals:   08/21/23 0421 08/21/23 0422 08/21/23 0423 08/21/23 0424  BP:  (!) 149/71    Pulse: 93 94 91 87  Resp:  18    Temp:  98.3 F (36.8 C)    TempSrc:  Oral    SpO2: 100% 99% 95% 98%  Weight:  87.5 kg    Height:       Weight change: -2.747 kg  Intake/Output Summary (Last 24 hours) at 08/21/2023 0827 Last data filed at 08/21/2023 0038 Gross per 24 hour  Intake 260 ml  Output 1405 ml  Net -1145 ml       Labs: RENAL PANEL Recent Labs  Lab 08/15/23 0235 08/16/23 0324 08/17/23 0228 08/18/23 0301 08/19/23 0312 08/20/23 0448 08/21/23 0249  NA 139   < > 140 138 137 136 136  K 4.3   < > 4.1 3.6 3.4* 3.4* 4.3  CL 105   < > 104 101 98 96* 96*  CO2 21*   < > 23 24 28 30 28   GLUCOSE 142*   < > 134* 128* 171* 139* 202*  BUN 135*   < > 142* 145* 101* 64* 75*  CREATININE 5.98*   < > 6.05* 6.07* 4.68* 3.84* 4.60*  CALCIUM 7.6*   < > 8.1* 8.0* 8.2* 8.5*  8.7*  PHOS 5.8*  --   --   --   --   --   --   ALBUMIN 3.0*  --   --   --   --   --   --    < > = values in this interval not displayed.    Liver Function Tests: Recent Labs  Lab 08/15/23 0235  ALBUMIN 3.0*   No results for input(s): "LIPASE", "AMYLASE" in the last 168 hours. No results for input(s): "AMMONIA" in the last 168 hours. CBC: Recent Labs    08/13/23 0224 08/14/23 0217 08/15/23 0235 08/16/23 0324 08/19/23 0312 08/20/23 0448  HGB 8.5* 8.3* 7.9* 9.0* 8.9* 10.0*  MCV 92.4 91.6 90.6 91.4 90.6 91.5  FERRITIN 140  --   --   --   --   --   TIBC 308  --   --   --   --   --   IRON 50  --   --   --   --   --     Cardiac Enzymes: No results for input(s): "CKTOTAL", "CKMB", "CKMBINDEX", "TROPONINI" in the last 168 hours. CBG: Recent Labs  Lab  08/20/23 0901 08/20/23 1155 08/20/23 1639 08/20/23 2203 08/21/23 0605  GLUCAP 107* 104* 379* 296* 160*    Iron Studies:  No results for input(s): "IRON", "TIBC", "TRANSFERRIN", "FERRITIN" in the last 72 hours.  Studies/Results: DG CHEST PORT 1 VIEW Result Date: 08/19/2023 CLINICAL DATA:  Persistent cough. EXAM: PORTABLE CHEST 1 VIEW COMPARISON:  Radiographs 08/11/2023 and 11/06/2020. Chest CT 12/08/2013. FINDINGS: 0953 hours. Right IJ hemodialysis catheter projects over the mid right atrium. The heart size and mediastinal contours are stable status post median sternotomy and CABG. Unchanged low lung volumes with mild vascular congestion. No overt pulmonary edema, confluent airspace disease, pneumothorax or significant pleural effusion. The bones appear unchanged. IMPRESSION: Unchanged low lung volumes with mild vascular congestion. No evidence of pneumonia or overt pulmonary edema. Electronically Signed   By: Carey Bullocks M.D.   On: 08/19/2023 11:59    Medications: Infusions:    Scheduled Medications:  aspirin EC  81 mg Oral Daily   atorvastatin  20 mg Oral Daily   calcitRIOL  0.25 mcg Oral Daily   carvedilol  3.125 mg Oral BID   Chlorhexidine Gluconate Cloth  6 each Topical Q0600   clopidogrel  75 mg Oral Daily   cyanocobalamin  500 mcg Oral Daily   [START ON 08/22/2023] darbepoetin (ARANESP) injection - NON-DIALYSIS  200 mcg Subcutaneous Q Fri-1800   feeding supplement  237 mL Oral TID BM   gabapentin  300 mg Oral QHS   heparin injection (subcutaneous)  5,000 Units Subcutaneous Q8H   insulin aspart  0-6 Units Subcutaneous TID WC   insulin glargine  7 Units Subcutaneous QHS   isosorbide mononitrate  30 mg Oral Daily   latanoprost  1 drop Both Eyes QHS   levothyroxine  50 mcg Oral Daily   multivitamin  1 tablet Per Tube QHS   sevelamer carbonate  800 mg Oral TID WC   tamsulosin  0.4 mg Oral QPC breakfast    have reviewed scheduled and prn medications.  Physical  Exam: General:NAD, comfortable Heart:RRR, s1s2 nl Lungs:clear b/l, no crackle Abdomen:soft, Non-tender, non-distended Extremities: Bilateral lower extremities edema++ Neurology: Alert, awake and following commands Access: RIJ TC, lt BCF (3/19) great thrill and bruit  Froylan Hobby W 08/21/2023,8:27 AM  LOS: 9 days

## 2023-08-21 NOTE — Care Management Important Message (Signed)
 Important Message  Patient Details  Name: Phillip [name] MRN: [id number] Date of Birth: 19-Aug-1955   Important Message Given:  Yes - Medicare IM     Renie Ora 08/21/2023, 1:23 PM

## 2023-08-22 DIAGNOSIS — I5020 Unspecified systolic (congestive) heart failure: Secondary | ICD-10-CM | POA: Diagnosis not present

## 2023-08-22 LAB — CBC
HCT: 32.1 % — ABNORMAL LOW (ref 39.0–52.0)
Hemoglobin: 10.3 g/dL — ABNORMAL LOW (ref 13.0–17.0)
MCH: 29.4 pg (ref 26.0–34.0)
MCHC: 32.1 g/dL (ref 30.0–36.0)
MCV: 91.7 fL (ref 80.0–100.0)
Platelets: 123 10*3/uL — ABNORMAL LOW (ref 150–400)
RBC: 3.5 MIL/uL — ABNORMAL LOW (ref 4.22–5.81)
RDW: 15.5 % (ref 11.5–15.5)
WBC: 9.6 10*3/uL (ref 4.0–10.5)
nRBC: 0 % (ref 0.0–0.2)

## 2023-08-22 LAB — PHOSPHORUS: Phosphorus: 3.3 mg/dL (ref 2.5–4.6)

## 2023-08-22 LAB — GLUCOSE, CAPILLARY
Glucose-Capillary: 144 mg/dL — ABNORMAL HIGH (ref 70–99)
Glucose-Capillary: 273 mg/dL — ABNORMAL HIGH (ref 70–99)
Glucose-Capillary: 227 mg/dL — ABNORMAL HIGH (ref 70–99)
Glucose-Capillary: 175 mg/dL — ABNORMAL HIGH (ref 70–99)

## 2023-08-22 MED ORDER — RENA-VITE PO TABS
1.0000 | ORAL_TABLET | Freq: Every day | ORAL | Status: DC
  Administered 2023-08-23 – 2023-08-24 (×2): 1 via ORAL
  Filled 2023-08-22 (×2): qty 1

## 2023-08-22 NOTE — TOC Progression Note (Signed)
 Transition of Care [facility]) - Progression Note    Patient Details  Name: Phillip [name] MRN: [id number] Date of Birth: Nov 08, 1955  Transition of Care [facility]) CM/SW Contact  Leone Haven, RN Phone Number: 08/22/2023, 4:21 PM  Clinical Narrative:    Per  renal Navigator still awaiting TB rule out so that OP HD can accept him.  TOC following.   Expected Discharge Plan: Home/Self Care Barriers to Discharge: Continued Medical Work up  Expected Discharge Plan and Services   Discharge Planning Services: CM Consult Post Acute Care Choice: NA Living arrangements for the past 2 months: Single Family Home                 DME Arranged: N/A DME Agency: NA       HH Arranged: NA           Social Determinants of Health (SDOH) Interventions SDOH Screenings   Food Insecurity: No Food Insecurity (08/12/2023)  Housing: Low Risk  (08/12/2023)  Transportation Needs: No Transportation Needs (08/12/2023)  Utilities: Not At Risk (08/12/2023)  Depression (PHQ2-9): Low Risk  (10/22/2018)  Financial Resource Strain: Low Risk  (09/26/2020)   Received from [facility], [facility]  Physical Activity: Inactive (09/26/2020)   Received from [facility], [facility]  Social Connections: Moderately Integrated (08/12/2023)  Stress: No Stress Concern Present (09/26/2020)   Received from [facility], [facility]  Tobacco Use: Medium Risk (08/20/2023)  Health Literacy: Low Risk  (09/26/2020)   Received from [facility], [facility]    Readmission Risk Interventions    08/13/2023    4:28 PM  Readmission Risk Prevention Plan  Transportation Screening Complete  HRI or Home Care Consult Complete  Palliative Care Screening Not Applicable  Medication Review (RN Care Manager) Complete

## 2023-08-22 NOTE — Plan of Care (Signed)
  Problem: Education: Goal: Ability to describe self-care measures that may prevent or decrease complications (Diabetes Survival Skills Education) will improve Outcome: Progressing   Problem: Coping: Goal: Ability to adjust to condition or change in health will improve Outcome: Progressing   Problem: Coping: Goal: Level of anxiety will decrease Outcome: Progressing

## 2023-08-22 NOTE — Plan of Care (Signed)
  Problem: Coping: Goal: Ability to adjust to condition or change in health will improve Outcome: Progressing   Problem: Activity: Goal: Risk for activity intolerance will decrease Outcome: Progressing   Problem: Coping: Goal: Level of anxiety will decrease Outcome: Progressing   Problem: Safety: Goal: Ability to remain free from injury will improve Outcome: Progressing

## 2023-08-22 NOTE — Progress Notes (Signed)
 Mobility Specialist Progress Note:   08/22/23 0945  Mobility  Activity Ambulated with assistance in hallway  Level of Assistance Contact guard assist, steadying assist  Assistive Device Cane  Distance Ambulated (ft) 200 ft  Activity Response Tolerated well  Mobility Referral Yes  Mobility visit 1 Mobility  Mobility Specialist Start Time (ACUTE ONLY) 0945  Mobility Specialist Stop Time (ACUTE ONLY) 1000  Mobility Specialist Time Calculation (min) (ACUTE ONLY) 15 min   Pt agreeable to mobility session. Required contact assist throughout ambulation with cane. No overt LOB noted, pt endorsed BLE weakness. Back in chair with all needs met, MD in room.  Addison Lank Mobility Specialist Please contact via SecureChat or  Rehab office at [phone]

## 2023-08-22 NOTE — Progress Notes (Addendum)
 TB test continues to be pending. Spoke to pt via phone to make him aware that clinic will not accept pt without this lab result. Discussed with attending and nephrologist.   Olivia Canter Renal Navigator [phone]  Addendum at 2:48 pm: Chest x-ray addendum provided to [facility] admissions for clinic to review in place of TB lab. Navigator advised [facility] and clinic that pt is very close to being ready for d/c.

## 2023-08-22 NOTE — Plan of Care (Signed)
  Problem: Coping: Goal: Ability to adjust to condition or change in health will improve Outcome: Progressing   Problem: Health Behavior/Discharge Planning: Goal: Ability to manage health-related needs will improve Outcome: Progressing   Problem: Metabolic: Goal: Ability to maintain appropriate glucose levels will improve Outcome: Progressing

## 2023-08-22 NOTE — Progress Notes (Signed)
 [facility] NEPHROLOGY PROGRESS NOTE  Assessment/ Plan: Pt is a 68 y.o. yo male with HTN, DM, CKD 5 thought to be due to diabetic nephropathy followed by Dr. Janit Pagan at [facility] with a recent baseline creatinine level seems to be around 5, CHF with preserved EF, CAD status post CABG, presented with worsening dyspnea on exertion, lower extremity edema worsening for 3 to 4 weeks.    #Acute kidney injury on CKD 5 with fluid overload/CHF exacerbation now progressing to ESRD: CKD thought to be due to diabetic nephropathy, follows with nephrologist in [facility].  This sounds like progressive CKD.   Urine output is increasing with current high-dose IV diuretics and metolazone.  continued current diuretics especially since he still has significant lower extremity edema.  He was not interested in dialysis at least this admit- therefore palliative care team was consulted to address goals of care. He has a cousin and friend on dialysis and they "look terrible"; after continued education he was willing to start HD.   Appreciate RIJ TDC by VIR on 3/17, tolerated HD #1 3/17 w/ 1L net UF. HD #2 Tues 2L, #3 on Th1.3L (cramping);   TTS regimen for now; likely close to EDW (190 lbs).  Appreciate Dr. Chestine Spore placing the left BCF on 3/19 (great thrill and bruit); CLIP in process.  Lives in [place] and referral submitted to [facility].   Needs neg TB either by Quantiferon or CXR reading. Quantiferon still in process (sent 3/18), then prior checks x-ray reading.  Reading that states low likelihood for active tuberculosis will be acceptable to the outpatient center as well.  Patient denies any cough, fever, hemoptysis.   #Acute on chronic CHF: Cardiology seeing,  diuretics.  Lasix 120 q 8 and metolazone 5 daily  stopped with decr UOP with HD.   #Hypertension/volume: Continue antihypertensives and volume management with diuretics.  Continue fluid restriction and low-sodium diet.   #Anemia of CKD:  Iron saturation 16% therefore ordered IV iron Venofer 200 3/12-3/14) and Aranesp (120 3/14).     #CKD-MBD/hyperphosphatemia: PTH 332 - and started sevelamer as a phosphorus binders and low dose calcitriol. Recheck phos on thur w dialysis (order written) -> not sent. Recheck today.    Subjective: Seen and examined at bedside.  Cramping with  dialysis yesterday; denies nausea, vomiting, dysphagia, chest pain or shortness of breath.  Has reluctantly accepted his fate.  Asking to go home and I spoke with his spouse as well.  Vitals:   08/21/23 1835 08/22/23 0103 08/22/23 0606 08/22/23 0740  BP: 125/82 131/71 120/84 134/80  Pulse: 90 93 94 91  Resp: 17 16 17 16   Temp:  98.7 F (37.1 C) 98.8 F (37.1 C) 97.6 F (36.4 C)  TempSrc:  Oral Oral Oral  SpO2:  95% 92%   Weight:   86.5 kg   Height:       Weight change: -0.953 kg  Intake/Output Summary (Last 24 hours) at 08/22/2023 0959 Last data filed at 08/22/2023 0914 Gross per 24 hour  Intake 477 ml  Output 1300 ml  Net -823 ml       Labs: RENAL PANEL Recent Labs  Lab 08/17/23 0228 08/18/23 0301 08/19/23 0312 08/20/23 0448 08/21/23 0249  NA 140 138 137 136 136  K 4.1 3.6 3.4* 3.4* 4.3  CL 104 101 98 96* 96*  CO2 23 24 28 30 28   GLUCOSE 134* 128* 171* 139* 202*  BUN 142* 145* 101* 64* 75*  CREATININE  6.05* 6.07* 4.68* 3.84* 4.60*  CALCIUM 8.1* 8.0* 8.2* 8.5* 8.7*    Liver Function Tests: No results for input(s): "AST", "ALT", "ALKPHOS", "BILITOT", "PROT", "ALBUMIN" in the last 168 hours.  No results for input(s): "LIPASE", "AMYLASE" in the last 168 hours. No results for input(s): "AMMONIA" in the last 168 hours. CBC: Recent Labs    08/13/23 0224 08/14/23 0217 08/16/23 0324 08/19/23 0312 08/20/23 0448 08/21/23 1400 08/22/23 0314  HGB 8.5*   < > 9.0* 8.9* 10.0* 10.0* 10.3*  MCV 92.4   < > 91.4 90.6 91.5 94.5 91.7  FERRITIN 140  --   --   --   --   --   --   TIBC 308  --   --   --   --   --   --   IRON 50  --    --   --   --   --   --    < > = values in this interval not displayed.    Cardiac Enzymes: No results for input(s): "CKTOTAL", "CKMB", "CKMBINDEX", "TROPONINI" in the last 168 hours. CBG: Recent Labs  Lab 08/21/23 0605 08/21/23 1121 08/21/23 1852 08/21/23 2159 08/22/23 0609  GLUCAP 160* 277* 143* 176* 144*    Iron Studies:  No results for input(s): "IRON", "TIBC", "TRANSFERRIN", "FERRITIN" in the last 72 hours.  Studies/Results: No results found.   Medications: Infusions:    Scheduled Medications:  aspirin EC  81 mg Oral Daily   atorvastatin  20 mg Oral Daily   calcitRIOL  0.25 mcg Oral Daily   carvedilol  3.125 mg Oral BID   Chlorhexidine Gluconate Cloth  6 each Topical Q0600   clopidogrel  75 mg Oral Daily   cyanocobalamin  500 mcg Oral Daily   darbepoetin (ARANESP) injection - NON-DIALYSIS  200 mcg Subcutaneous Q Fri-1800   feeding supplement  237 mL Oral TID BM   gabapentin  300 mg Oral QHS   heparin injection (subcutaneous)  5,000 Units Subcutaneous Q8H   insulin aspart  0-6 Units Subcutaneous TID WC   insulin glargine  7 Units Subcutaneous QHS   isosorbide mononitrate  30 mg Oral Daily   latanoprost  1 drop Both Eyes QHS   levothyroxine  50 mcg Oral Daily   multivitamin  1 tablet Per Tube QHS   sevelamer carbonate  800 mg Oral TID WC   tamsulosin  0.4 mg Oral QPC breakfast    have reviewed scheduled and prn medications.  Physical Exam: General:NAD, comfortable Heart:RRR, s1s2 nl Lungs:clear b/l, no crackle Abdomen:soft, Non-tender, non-distended Extremities: Bilateral lower extremities edema++ Neurology: Alert, awake and following commands Access: RIJ TC, lt BCF (3/19) great thrill and bruit  Saurabh Hettich W 08/22/2023,9:59 AM  LOS: 10 days

## 2023-08-22 NOTE — Progress Notes (Signed)
 PROGRESS NOTE    Phillip [name]  NWG:[id number] DOB: Oct 19, 1955 DOA: 08/11/2023 PCP: Gordan Payment., MD   Brief Narrative:  67/M with hypertension, type 2 diabetes mellitus, CKD 5, CAD/CABG, chronic systolic CHF, hypothyroidism presented to the ED with worsening shortness of breath and swelling, with orthopnea.  Followed by nephrology in [facility], has been reluctant to consider dialysis. -Also recently underwent right and left heart cath 2/28 for TAVR workup. -In the ED noted to be significantly volume overloaded, hemoglobin 9.8, creatinine 6.1, BNP 685, chest x-ray with hypoinflation -Admitted, started on high-dose diuretics, seen by cards and nephrology, initially refused dialysis, but then agreed.Cards signed off, recommended valve workup following stability with dialysis -3/17: Tunneled right IJ HD cath placed, started HD -3/18 AVF creation  Assessment & Plan:   Principal Problem:   Heart failure with reduced ejection fraction (HCC) Active Problems:   Acute kidney injury superimposed on chronic kidney disease (HCC)   CAD (coronary artery disease)   Essential hypertension   Aortic stenosis, moderate   Type 2 diabetes mellitus with stage 4 chronic kidney disease, with long-term current use of insulin (HCC)   Pancytopenia (HCC)   Hyperlipidemia   Hypothyroidism   Malnutrition of moderate degree  AKI on CKD 5, progressive / New ESRD -Baseline creatinine around 3-4, has been up to the 5 range recently, nephrology consulting, considered ESRD at this time -Diuresed with high-dose IV Lasix, 11.4 neg, now off -Finally after much back-and-forth discussion, patient agreed to move on with hemodialysis.  Underwent tunneled HD catheter placement -Started HD 3/17, followed by 3/18 -Decreased gabapentin and Flexeril dose -Clip for outpatient HD pending,  Underwent creation of AV fistula by vascular surgery 08/20/2023.   Acute on chronic systolic CHF -Echo 1/25 at [facility] with  EF 30-35% with wall motion abnormality, grade 2 DD, low-flow low gradient severe aortic stenosis, severe pulmonary hypertension -Now on HD, not euvolemic yet, now off diuretics -Continue Coreg, Imdur Volume management with dialysis.   Severe aortic stenosis, low-flow low gradient -Noted on echo 1/25, seen by structural heart team and underwent right and left heart cath with plan to complete TAVR protocol  -Follow-up with structural heart team in few weeks   CAD Peripheral vascular disease Patient with prior history history of four-vessel CABG back in 2003.  Patient had left heart cath 07/2023 which showed patent LIMA to LAD but all 3 vein grafts occluded. -Continue aspirin, Plavix, beta-blocker, statin   Essential hypertension Blood pressure reasonably well-controlled. Prior to admission patient was on amlodipine, clonidine, carvedilol, isosorbide mononitrate. Currently noted to be on carvedilol, isosorbide mononitrate.  It appears that the amlodipine and clonidine are currently on hold.  Monitor for rebound hypertension.   Diabetes mellitus type 2, with long-term use of insulin Last available hemoglobin A1c was 7.2 when last checked on 12/13 Noted to be on SSI and glargine 7 units.  Monitor CBGs.  Blood sugar controlled.   Pancytopenia Chronic.,  Stable monitor   Hyperlipidemia -Continue atorvastatin   Hypothyroidism TSH was noted to be 3.99 last admit 06/2023 -Repeat TSH now is 7.9, free T4 was normal -Continue levothyroxine, recommend repeat labs in 4 to 6 weeks   BPH -Continue Flomax  DVT prophylaxis: heparin injection 5,000 Units Start: 08/12/23 1400   Code Status: Full Code  Family Communication:  None present at bedside.  Plan of care discussed with patient in length and he/she verbalized understanding and agreed with it.  Status is: Inpatient Remains inpatient appropriate because:  Medically stable, pending clip process.   Estimated body mass index is 25.86 kg/m as  calculated from the following:   Height as of this encounter: 6' (1.829 m).   Weight as of this encounter: 86.5 kg.    Nutritional Assessment: Body mass index is 25.86 kg/m.Marland Kitchen Seen by dietician.  I agree with the assessment and plan as outlined below: Nutrition Status: Nutrition Problem: Moderate Malnutrition Etiology: chronic illness (CKD now progressed to ESRD with new iHD start, heart failure) Signs/Symptoms: mild fat depletion, moderate fat depletion, moderate muscle depletion, edema Interventions: Ensure Enlive (each supplement provides 350kcal and 20 grams of protein), Refer to RD note for recommendations  . Skin Assessment: I have examined the patient's skin and I agree with the wound assessment as performed by the wound care RN as outlined below:    Consultants:  Nephrology  Procedures:  As above  Antimicrobials:  Anti-infectives (From admission, onward)    Start     Dose/Rate Route Frequency Ordered Stop   08/20/23 0600  ceFAZolin (ANCEF) IVPB 1 g/50 mL premix        1 g 100 mL/hr over 30 Minutes Intravenous On call to O.R. 08/19/23 0837 08/20/23 1040   08/18/23 1500  ceFAZolin (ANCEF) IVPB 2g/100 mL premix  Status:  Discontinued        2 g 200 mL/hr over 30 Minutes Intravenous To Radiology 08/16/23 1210 08/19/23 0858   08/18/23 0812  ceFAZolin (ANCEF) IVPB 2g/100 mL premix        over 30 Minutes Intravenous Continuous PRN 08/18/23 0812 08/18/23 0812         Subjective: Patient seen and examined.  No complaints.  He is inquiring when he can go home.  I informed him that we are waiting for outpatient dialysis arrangement.  Objective: Vitals:   08/21/23 1835 08/22/23 0103 08/22/23 0606 08/22/23 0740  BP: 125/82 131/71 120/84 134/80  Pulse: 90 93 94 91  Resp: 17 16 17 16   Temp:  98.7 F (37.1 C) 98.8 F (37.1 C) 97.6 F (36.4 C)  TempSrc:  Oral Oral Oral  SpO2:  95% 92%   Weight:   86.5 kg   Height:        Intake/Output Summary (Last 24 hours) at  08/22/2023 0807 Last data filed at 08/21/2023 2300 Gross per 24 hour  Intake 460 ml  Output 1300 ml  Net -840 ml   Filed Weights   08/21/23 1433 08/21/23 1825 08/22/23 0606  Weight: 86.5 kg 85.9 kg 86.5 kg    Examination:  General exam: Appears calm and comfortable  Respiratory system: Clear to auscultation. Respiratory effort normal. Cardiovascular system: S1 & S2 heard, RRR. No JVD, murmurs, rubs, gallops or clicks. No pedal edema. Gastrointestinal system: Abdomen is nondistended, soft and nontender. No organomegaly or masses felt. Normal bowel sounds heard. Central nervous system: Alert and oriented. No focal neurological deficits. Extremities: Symmetric 5 x 5 power. Skin: No rashes, lesions or ulcers Psychiatry: Judgement and insight appear normal. Mood & affect appropriate.    Data Reviewed: I have personally reviewed following labs and imaging studies  CBC: Recent Labs  Lab 08/16/23 0324 08/19/23 0312 08/20/23 0448 08/21/23 1400 08/22/23 0314  WBC 5.9 8.0 7.7 9.4 9.6  HGB 9.0* 8.9* 10.0* 10.0* 10.3*  HCT 27.5* 27.0* 30.3* 30.8* 32.1*  MCV 91.4 90.6 91.5 94.5 91.7  PLT 104* 122* 129* 130* 123*   Basic Metabolic Panel: Recent Labs  Lab 08/17/23 0228 08/18/23 0301 08/19/23 6440 08/20/23 0448  08/21/23 0249  NA 140 138 137 136 136  K 4.1 3.6 3.4* 3.4* 4.3  CL 104 101 98 96* 96*  CO2 23 24 28 30 28   GLUCOSE 134* 128* 171* 139* 202*  BUN 142* 145* 101* 64* 75*  CREATININE 6.05* 6.07* 4.68* 3.84* 4.60*  CALCIUM 8.1* 8.0* 8.2* 8.5* 8.7*   GFR: Estimated Creatinine Clearance: 16.9 mL/min (A) (by C-G formula based on SCr of 4.6 mg/dL (H)). Liver Function Tests: No results for input(s): "AST", "ALT", "ALKPHOS", "BILITOT", "PROT", "ALBUMIN" in the last 168 hours. No results for input(s): "LIPASE", "AMYLASE" in the last 168 hours. No results for input(s): "AMMONIA" in the last 168 hours. Coagulation Profile: Recent Labs  Lab 08/17/23 0228  INR 1.4*   Cardiac  Enzymes: No results for input(s): "CKTOTAL", "CKMB", "CKMBINDEX", "TROPONINI" in the last 168 hours. BNP (last 3 results) Recent Labs    07/14/23 1412  PROBNP 11,269*   HbA1C: No results for input(s): "HGBA1C" in the last 72 hours. CBG: Recent Labs  Lab 08/21/23 0605 08/21/23 1121 08/21/23 1852 08/21/23 2159 08/22/23 0609  GLUCAP 160* 277* 143* 176* 144*   Lipid Profile: No results for input(s): "CHOL", "HDL", "LDLCALC", "TRIG", "CHOLHDL", "LDLDIRECT" in the last 72 hours. Thyroid Function Tests: No results for input(s): "TSH", "T4TOTAL", "FREET4", "T3FREE", "THYROIDAB" in the last 72 hours. Anemia Panel: No results for input(s): "VITAMINB12", "FOLATE", "FERRITIN", "TIBC", "IRON", "RETICCTPCT" in the last 72 hours. Sepsis Labs: No results for input(s): "PROCALCITON", "LATICACIDVEN" in the last 168 hours.  No results found for this or any previous visit (from the past 240 hours).   Radiology Studies: No results found.  Scheduled Meds:  aspirin EC  81 mg Oral Daily   atorvastatin  20 mg Oral Daily   calcitRIOL  0.25 mcg Oral Daily   carvedilol  3.125 mg Oral BID   Chlorhexidine Gluconate Cloth  6 each Topical Q0600   clopidogrel  75 mg Oral Daily   cyanocobalamin  500 mcg Oral Daily   darbepoetin (ARANESP) injection - NON-DIALYSIS  200 mcg Subcutaneous Q Fri-1800   feeding supplement  237 mL Oral TID BM   gabapentin  300 mg Oral QHS   heparin injection (subcutaneous)  5,000 Units Subcutaneous Q8H   insulin aspart  0-6 Units Subcutaneous TID WC   insulin glargine  7 Units Subcutaneous QHS   isosorbide mononitrate  30 mg Oral Daily   latanoprost  1 drop Both Eyes QHS   levothyroxine  50 mcg Oral Daily   multivitamin  1 tablet Per Tube QHS   sevelamer carbonate  800 mg Oral TID WC   tamsulosin  0.4 mg Oral QPC breakfast   Continuous Infusions:   LOS: 10 days   Hughie Closs, MD [facility]  08/22/2023, 8:07 AM   *Please note that this is a verbal  dictation therefore any spelling or grammatical errors are due to the "[employer]agon [employer] One" system interpretation.  Please page via Amion and do not message via secure chat for urgent patient care matters. Secure chat can be used for non urgent patient care matters.  How to contact the [facility] Attending or Consulting provider 7A - 7P or covering provider during after hours 7P -7A, for this patient?  Check the care team in [facility] and look for a) attending/consulting TRH provider listed and b) the [facility] team listed. Page or secure chat 7A-7P. Log into [email or web address] and use [employer]'s universal password to access. If you do not have the password, please  contact the hospital operator. Locate the [facility] provider you are looking for under [facility] and page to a number that you can be directly reached. If you still have difficulty reaching the provider, please page the [facility] (Director on Call) for the Hospitalists listed on amion for assistance.

## 2023-08-23 DIAGNOSIS — I5020 Unspecified systolic (congestive) heart failure: Secondary | ICD-10-CM | POA: Diagnosis not present

## 2023-08-23 LAB — GLUCOSE, CAPILLARY
Glucose-Capillary: 118 mg/dL — ABNORMAL HIGH (ref 70–99)
Glucose-Capillary: 108 mg/dL — ABNORMAL HIGH (ref 70–99)
Glucose-Capillary: 211 mg/dL — ABNORMAL HIGH (ref 70–99)
Glucose-Capillary: 189 mg/dL — ABNORMAL HIGH (ref 70–99)

## 2023-08-23 LAB — QUANTIFERON-TB GOLD PLUS (RQFGPL)
QuantiFERON Mitogen Value: 2.78 [IU]/mL
QuantiFERON Nil Value: 0.02 [IU]/mL
QuantiFERON TB1 Ag Value: 0.02 [IU]/mL
QuantiFERON TB2 Ag Value: 0.02 [IU]/mL

## 2023-08-23 LAB — QUANTIFERON-TB GOLD PLUS: QuantiFERON-TB Gold Plus: NEGATIVE

## 2023-08-23 MED ORDER — HEPARIN SODIUM (PORCINE) 1000 UNIT/ML IJ SOLN
3200.0000 [IU] | Freq: Once | INTRAMUSCULAR | Status: AC
  Administered 2023-08-23: 3200 [IU]

## 2023-08-23 NOTE — Plan of Care (Signed)
     Chart reviewed. Patient continues with TTS dialysis. He is waiting for TB results for admission to [facility].   Thank you for your referral and allowing PMT to assist in Phillip [name]'s care. Please contact PMT with any needs.   Sarina Ser, NP Palliative Medicine Team  Team Phone # [phone]   NO CHARGE

## 2023-08-23 NOTE — Progress Notes (Signed)
 PROGRESS NOTE    Phillip [name]  ZOX:[id number] DOB: 10/26/1955 DOA: 08/11/2023 PCP: Gordan Payment., MD   Brief Narrative:  67/M with hypertension, type 2 diabetes mellitus, CKD 5, CAD/CABG, chronic systolic CHF, hypothyroidism presented to the ED with worsening shortness of breath and swelling, with orthopnea.  Followed by nephrology in [facility], has been reluctant to consider dialysis. -Also recently underwent right and left heart cath 2/28 for TAVR workup. -In the ED noted to be significantly volume overloaded, hemoglobin 9.8, creatinine 6.1, BNP 685, chest x-ray with hypoinflation -Admitted, started on high-dose diuretics, seen by cards and nephrology, initially refused dialysis, but then agreed.Cards signed off, recommended valve workup following stability with dialysis -3/17: Tunneled right IJ HD cath placed, started HD -3/18 AVF creation  Assessment & Plan:   Principal Problem:   Heart failure with reduced ejection fraction (HCC) Active Problems:   Acute kidney injury superimposed on chronic kidney disease (HCC)   CAD (coronary artery disease)   Essential hypertension   Aortic stenosis, moderate   Type 2 diabetes mellitus with stage 4 chronic kidney disease, with long-term current use of insulin (HCC)   Pancytopenia (HCC)   Hyperlipidemia   Hypothyroidism   Malnutrition of moderate degree  AKI on CKD 5, progressive / New ESRD -Baseline creatinine around 3-4, has been up to the 5 range recently, nephrology consulting, considered ESRD at this time -Diuresed with high-dose IV Lasix, 11.4 neg, now off -Finally after much back-and-forth discussion, patient agreed to move on with hemodialysis.  Underwent tunneled HD catheter placement -Started HD 3/17, followed by 3/18 -Decreased gabapentin and Flexeril dose -Clip for outpatient HD pending,  Underwent creation of AV fistula by vascular surgery 08/20/2023.  Currently on TTS schedule.   Acute on chronic systolic CHF -Echo  1/25 at [facility] with EF 30-35% with wall motion abnormality, grade 2 DD, low-flow low gradient severe aortic stenosis, severe pulmonary hypertension -Now on HD, not euvolemic yet, now off diuretics -Continue Coreg, Imdur Volume management with dialysis.   Severe aortic stenosis, low-flow low gradient -Noted on echo 1/25, seen by structural heart team and underwent right and left heart cath with plan to complete TAVR protocol  -Follow-up with structural heart team in few weeks   CAD Peripheral vascular disease Patient with prior history history of four-vessel CABG back in 2003.  Patient had left heart cath 07/2023 which showed patent LIMA to LAD but all 3 vein grafts occluded. -Continue aspirin, Plavix, beta-blocker, statin   Essential hypertension Blood pressure reasonably well-controlled. Prior to admission patient was on amlodipine, clonidine, carvedilol, isosorbide mononitrate. Currently noted to be on carvedilol, isosorbide mononitrate.  It appears that the amlodipine and clonidine are currently on hold.  Monitor for rebound hypertension.   Diabetes mellitus type 2, with long-term use of insulin Last available hemoglobin A1c was 7.2 when last checked on 12/13 Noted to be on SSI and glargine 7 units.  Monitor CBGs.  Blood sugar controlled.   Pancytopenia Chronic.,  Stable monitor   Hyperlipidemia -Continue atorvastatin   Hypothyroidism TSH was noted to be 3.99 last admit 06/2023 -Repeat TSH now is 7.9, free T4 was normal -Continue levothyroxine, recommend repeat labs in 4 to 6 weeks   BPH -Continue Flomax  DVT prophylaxis: heparin injection 5,000 Units Start: 08/12/23 1400   Code Status: Full Code  Family Communication:  None present at bedside.  Plan of care discussed with patient in length and he/she verbalized understanding and agreed with it.  Status is:  Inpatient Remains inpatient appropriate because: Medically stable, pending clip process.   Estimated body  mass index is 25.93 kg/m as calculated from the following:   Height as of this encounter: 6' (1.829 m).   Weight as of this encounter: 86.7 kg.    Nutritional Assessment: Body mass index is 25.93 kg/m.Marland Kitchen Seen by dietician.  I agree with the assessment and plan as outlined below: Nutrition Status: Nutrition Problem: Moderate Malnutrition Etiology: chronic illness (CKD now progressed to ESRD with new iHD start, heart failure) Signs/Symptoms: mild fat depletion, moderate fat depletion, moderate muscle depletion, edema Interventions: Ensure Enlive (each supplement provides 350kcal and 20 grams of protein), Refer to RD note for recommendations  . Skin Assessment: I have examined the patient's skin and I agree with the wound assessment as performed by the wound care RN as outlined below:    Consultants:  Nephrology  Procedures:  As above  Antimicrobials:  Anti-infectives (From admission, onward)    Start     Dose/Rate Route Frequency Ordered Stop   08/20/23 0600  ceFAZolin (ANCEF) IVPB 1 g/50 mL premix        1 g 100 mL/hr over 30 Minutes Intravenous On call to O.R. 08/19/23 0837 08/20/23 1040   08/18/23 1500  ceFAZolin (ANCEF) IVPB 2g/100 mL premix  Status:  Discontinued        2 g 200 mL/hr over 30 Minutes Intravenous To Radiology 08/16/23 1210 08/19/23 0858   08/18/23 0812  ceFAZolin (ANCEF) IVPB 2g/100 mL premix        over 30 Minutes Intravenous Continuous PRN 08/18/23 0812 08/18/23 0812         Subjective: Seen and examined.  He is in the recliner.  Granddaughter laying in his bed.  He has no complaints.  Objective: Vitals:   08/23/23 0001 08/23/23 0002 08/23/23 0625 08/23/23 0700  BP: 119/72  127/74 124/72  Pulse: 94 96 (!) 106 90  Resp: 18  17   Temp: 98.9 F (37.2 C)  98.5 F (36.9 C) 98.5 F (36.9 C)  TempSrc: Oral  Oral Oral  SpO2: 97% 97% 99% 99%  Weight:   86.7 kg   Height:        Intake/Output Summary (Last 24 hours) at 08/23/2023 0754 Last data  filed at 08/22/2023 2100 Gross per 24 hour  Intake 597 ml  Output 110 ml  Net 487 ml   Filed Weights   08/21/23 1825 08/22/23 0606 08/23/23 0625  Weight: 85.9 kg 86.5 kg 86.7 kg    Examination:  General exam: Appears calm and comfortable  Respiratory system: Clear to auscultation. Respiratory effort normal. Cardiovascular system: S1 & S2 heard, RRR. No JVD, murmurs, rubs, gallops or clicks. No pedal edema. Gastrointestinal system: Abdomen is nondistended, soft and nontender. No organomegaly or masses felt. Normal bowel sounds heard. Central nervous system: Alert and oriented. No focal neurological deficits. Extremities: Symmetric 5 x 5 power. Skin: No rashes, lesions or ulcers.  Psychiatry: Judgement and insight appear normal. Mood & affect appropriate.   Data Reviewed: I have personally reviewed following labs and imaging studies  CBC: Recent Labs  Lab 08/19/23 0312 08/20/23 0448 08/21/23 1400 08/22/23 0314  WBC 8.0 7.7 9.4 9.6  HGB 8.9* 10.0* 10.0* 10.3*  HCT 27.0* 30.3* 30.8* 32.1*  MCV 90.6 91.5 94.5 91.7  PLT 122* 129* 130* 123*   Basic Metabolic Panel: Recent Labs  Lab 08/17/23 0228 08/18/23 0301 08/19/23 0312 08/20/23 0448 08/21/23 0249 08/22/23 1135  NA 140 138 137  136 136  --   K 4.1 3.6 3.4* 3.4* 4.3  --   CL 104 101 98 96* 96*  --   CO2 23 24 28 30 28   --   GLUCOSE 134* 128* 171* 139* 202*  --   BUN 142* 145* 101* 64* 75*  --   CREATININE 6.05* 6.07* 4.68* 3.84* 4.60*  --   CALCIUM 8.1* 8.0* 8.2* 8.5* 8.7*  --   PHOS  --   --   --   --   --  3.3   GFR: Estimated Creatinine Clearance: 16.9 mL/min (A) (by C-G formula based on SCr of 4.6 mg/dL (H)). Liver Function Tests: No results for input(s): "AST", "ALT", "ALKPHOS", "BILITOT", "PROT", "ALBUMIN" in the last 168 hours. No results for input(s): "LIPASE", "AMYLASE" in the last 168 hours. No results for input(s): "AMMONIA" in the last 168 hours. Coagulation Profile: Recent Labs  Lab 08/17/23 0228   INR 1.4*   Cardiac Enzymes: No results for input(s): "CKTOTAL", "CKMB", "CKMBINDEX", "TROPONINI" in the last 168 hours. BNP (last 3 results) Recent Labs    07/14/23 1412  PROBNP 11,269*   HbA1C: No results for input(s): "HGBA1C" in the last 72 hours. CBG: Recent Labs  Lab 08/22/23 0609 08/22/23 1129 08/22/23 1544 08/22/23 2053 08/23/23 0635  GLUCAP 144* 273* 227* 175* 118*   Lipid Profile: No results for input(s): "CHOL", "HDL", "LDLCALC", "TRIG", "CHOLHDL", "LDLDIRECT" in the last 72 hours. Thyroid Function Tests: No results for input(s): "TSH", "T4TOTAL", "FREET4", "T3FREE", "THYROIDAB" in the last 72 hours. Anemia Panel: No results for input(s): "VITAMINB12", "FOLATE", "FERRITIN", "TIBC", "IRON", "RETICCTPCT" in the last 72 hours. Sepsis Labs: No results for input(s): "PROCALCITON", "LATICACIDVEN" in the last 168 hours.  No results found for this or any previous visit (from the past 240 hours).   Radiology Studies: No results found.  Scheduled Meds:  aspirin EC  81 mg Oral Daily   atorvastatin  20 mg Oral Daily   calcitRIOL  0.25 mcg Oral Daily   carvedilol  3.125 mg Oral BID   Chlorhexidine Gluconate Cloth  6 each Topical Q0600   clopidogrel  75 mg Oral Daily   cyanocobalamin  500 mcg Oral Daily   darbepoetin (ARANESP) injection - NON-DIALYSIS  200 mcg Subcutaneous Q Fri-1800   feeding supplement  237 mL Oral TID BM   gabapentin  300 mg Oral QHS   heparin injection (subcutaneous)  5,000 Units Subcutaneous Q8H   insulin aspart  0-6 Units Subcutaneous TID WC   insulin glargine  7 Units Subcutaneous QHS   isosorbide mononitrate  30 mg Oral Daily   latanoprost  1 drop Both Eyes QHS   levothyroxine  50 mcg Oral Daily   multivitamin  1 tablet Oral QHS   sevelamer carbonate  800 mg Oral TID WC   tamsulosin  0.4 mg Oral QPC breakfast   Continuous Infusions:   LOS: 11 days   Hughie Closs, MD [facility]  08/23/2023, 7:54 AM   *Please note that this  is a verbal dictation therefore any spelling or grammatical errors are due to the "Dragon [employer] One" system interpretation.  Please page via Amion and do not message via secure chat for urgent patient care matters. Secure chat can be used for non urgent patient care matters.  How to contact the [facility] Attending or Consulting provider 7A - 7P or covering provider during after hours 7P -7A, for this patient?  Check the care team in [facility] and look for a)  attending/consulting TRH provider listed and b) the [facility] team listed. Page or secure chat 7A-7P. Log into [email or web address] and use [facility]'s universal password to access. If you do not have the password, please contact the hospital operator. Locate the [facility] provider you are looking for under [facility] and page to a number that you can be directly reached. If you still have difficulty reaching the provider, please page the [facility] (Director on Call) for the Hospitalists listed on amion for assistance.

## 2023-08-23 NOTE — Progress Notes (Signed)
 Mobility Specialist Progress Note:   08/23/23 1510  Mobility  Activity Ambulated with assistance in hallway  Level of Assistance Contact guard assist, steadying assist  Assistive Device Cane  Distance Ambulated (ft) 200 ft  Activity Response Tolerated well  Mobility Referral Yes  Mobility visit 1 Mobility  Mobility Specialist Start Time (ACUTE ONLY) 1510  Mobility Specialist Stop Time (ACUTE ONLY) 1521  Mobility Specialist Time Calculation (min) (ACUTE ONLY) 11 min   Pt eager for mobility session. Required contact assist for ambulation with cane. No unsteadiness noted. Pt back in chair with all needs met.   Addison Lank Mobility Specialist Please contact via SecureChat or  Rehab office at [phone]

## 2023-08-23 NOTE — Progress Notes (Signed)
 Mobility Specialist Progress Note:   08/23/23 1400  Mobility  Activity Ambulated with assistance in room;Ambulated with assistance to bathroom  Level of Assistance Standby assist, set-up cues, supervision of patient - no hands on  Assistive Device None  Distance Ambulated (ft) 50 ft  Activity Response Tolerated well  Mobility Referral Yes  Mobility visit 1 Mobility  Mobility Specialist Start Time (ACUTE ONLY) 1400  Mobility Specialist Stop Time (ACUTE ONLY) 1415  Mobility Specialist Time Calculation (min) (ACUTE ONLY) 15 min   Pt recently back from dialysis. Able to ambulate in room with no AD use and supervision. No c/o throughout. Pt back in chair with all needs met, visitor present.   Addison Lank Mobility Specialist Please contact via SecureChat or  Rehab office at [phone]

## 2023-08-23 NOTE — Plan of Care (Signed)
  Problem: Education: Goal: Ability to describe self-care measures that may prevent or decrease complications (Diabetes Survival Skills Education) will improve Outcome: Progressing Goal: Individualized Educational Video(s) Outcome: Progressing   Problem: Coping: Goal: Ability to adjust to condition or change in health will improve Outcome: Progressing   Problem: Fluid Volume: Goal: Ability to maintain a balanced intake and output will improve Outcome: Progressing   Problem: Health Behavior/Discharge Planning: Goal: Ability to identify and utilize available resources and services will improve Outcome: Progressing Goal: Ability to manage health-related needs will improve Outcome: Progressing   Problem: Metabolic: Goal: Ability to maintain appropriate glucose levels will improve Outcome: Progressing   Problem: Nutritional: Goal: Maintenance of adequate nutrition will improve Outcome: Progressing Goal: Progress toward achieving an optimal weight will improve Outcome: Progressing   Problem: Skin Integrity: Goal: Risk for impaired skin integrity will decrease Outcome: Progressing   Problem: Tissue Perfusion: Goal: Adequacy of tissue perfusion will improve Outcome: Progressing   Problem: Education: Goal: Knowledge of General Education information will improve Description: Including pain rating scale, medication(s)/side effects and non-pharmacologic comfort measures Outcome: Progressing   Problem: Health Behavior/Discharge Planning: Goal: Ability to manage health-related needs will improve Outcome: Progressing   Problem: Clinical Measurements: Goal: Ability to maintain clinical measurements within normal limits will improve Outcome: Progressing Goal: Will remain free from infection Outcome: Progressing Goal: Diagnostic test results will improve Outcome: Progressing Goal: Respiratory complications will improve Outcome: Progressing Goal: Cardiovascular complication will  be avoided Outcome: Progressing   Problem: Activity: Goal: Risk for activity intolerance will decrease Outcome: Progressing   Problem: Nutrition: Goal: Adequate nutrition will be maintained Outcome: Progressing   Problem: Coping: Goal: Level of anxiety will decrease Outcome: Progressing   Problem: Elimination: Goal: Will not experience complications related to bowel motility Outcome: Progressing Goal: Will not experience complications related to urinary retention Outcome: Progressing   Problem: Pain Managment: Goal: General experience of comfort will improve and/or be controlled Outcome: Progressing   Problem: Safety: Goal: Ability to remain free from injury will improve Outcome: Progressing   Problem: Skin Integrity: Goal: Risk for impaired skin integrity will decrease Outcome: Progressing   Problem: Education: Goal: Ability to demonstrate management of disease process will improve Outcome: Progressing Goal: Ability to verbalize understanding of medication therapies will improve Outcome: Progressing Goal: Individualized Educational Video(s) Outcome: Progressing   Problem: Activity: Goal: Capacity to carry out activities will improve Outcome: Progressing   Problem: Cardiac: Goal: Ability to achieve and maintain adequate cardiopulmonary perfusion will improve Outcome: Progressing   Problem: Education: Goal: Knowledge of disease and its progression will improve Outcome: Progressing Goal: Individualized Educational Video(s) Outcome: Progressing   Problem: Fluid Volume: Goal: Compliance with measures to maintain balanced fluid volume will improve Outcome: Progressing   Problem: Health Behavior/Discharge Planning: Goal: Ability to manage health-related needs will improve Outcome: Progressing   Problem: Nutritional: Goal: Ability to make healthy dietary choices will improve Outcome: Progressing   Problem: Clinical Measurements: Goal: Complications related  to the disease process, condition or treatment will be avoided or minimized Outcome: Progressing

## 2023-08-23 NOTE — Progress Notes (Signed)
 [name] NEPHROLOGY PROGRESS NOTE  Assessment/ Plan: Pt is a 68 y.o. yo male with HTN, DM, CKD 5 thought to be due to diabetic nephropathy followed by Dr. Janit [name] at [facility] with a recent baseline creatinine level seems to be around 5, CHF with preserved EF, CAD status post CABG, presented with worsening dyspnea on exertion, lower extremity edema worsening for 3 to 4 weeks.    #Acute kidney injury on CKD 5 with fluid overload/CHF exacerbation now progressing to ESRD: CKD thought to be due to diabetic nephropathy, follows with nephrologist in [facility].  This sounds like progressive CKD.   Urine output is increasing with current high-dose IV diuretics and metolazone.  continued current diuretics especially since he still has significant lower extremity edema.  He was not interested in dialysis at least this admit- therefore palliative care team was consulted to address goals of care. He has a cousin and friend on dialysis and they "look terrible"; after continued education he was willing to start HD.   Appreciate RIJ TDC by VIR on 3/17, tolerated HD #1 3/17 w/ 1L net UF. HD #2 Tues 2L, #3 on Th1.3L (cramping);   TTS regimen for now; likely close to EDW (190 lbs).  Seen on dialysis through a right IJ tunnel catheter 1 L UF tolerating 3K bath 141/75  Appreciate Dr. Chestine [name] placing the left BCF on 3/19 (great thrill and bruit); CLIP in process.  Lives in [place] and referral submitted to [facility].   CXR no evidence of active TB 3/18. Quantiferon neg (sent 3/18).  Patient denies any cough, fever, hemoptysis.   #Acute on chronic CHF: Cardiology seeing,  diuretics.  Lasix 120 q 8 and metolazone 5 daily  stopped with decr UOP with HD.   #Hypertension/volume: Continue antihypertensives and volume management with diuretics.  Continue fluid restriction and low-sodium diet.   #Anemia of CKD: Iron saturation 16% therefore ordered IV iron Venofer 200 3/12-3/14) and Aranesp (120  3/14).     #CKD-MBD/hyperphosphatemia: PTH 332 - and started sevelamer as a phosphorus binders and low dose calcitriol. Recheck phos on thur w dialysis (order written) -> not sent. Recheck 3/21 3.3.  Subjective: Seen and examined at bedside.  Doing well today on dialysis mainly wondering when he is going home  Vitals:   08/23/23 1000 08/23/23 1030 08/23/23 1100 08/23/23 1130  BP: 123/70 122/70 129/76 131/72  Pulse: 92 95 96 94  Resp: 15 18 16 14   Temp:      TempSrc:      SpO2: 97% 95% 99% 100%  Weight:      Height:       Weight change: 0.228 kg  Intake/Output Summary (Last 24 hours) at 08/23/2023 1154 Last data filed at 08/23/2023 0853 Gross per 24 hour  Intake 600 ml  Output 110 ml  Net 490 ml       Labs: RENAL PANEL Recent Labs  Lab 08/17/23 0228 08/18/23 0301 08/19/23 0312 08/20/23 0448 08/21/23 0249 08/22/23 1135  NA 140 138 137 136 136  --   K 4.1 3.6 3.4* 3.4* 4.3  --   CL 104 101 98 96* 96*  --   CO2 23 24 28 30 28   --   GLUCOSE 134* 128* 171* 139* 202*  --   BUN 142* 145* 101* 64* 75*  --   CREATININE 6.05* 6.07* 4.68* 3.84* 4.60*  --   CALCIUM 8.1* 8.0* 8.2* 8.5* 8.7*  --   PHOS  --   --   --   --   --  3.3    Liver Function Tests: No results for input(s): "AST", "ALT", "ALKPHOS", "BILITOT", "PROT", "ALBUMIN" in the last 168 hours.  No results for input(s): "LIPASE", "AMYLASE" in the last 168 hours. No results for input(s): "AMMONIA" in the last 168 hours. CBC: Recent Labs    08/13/23 0224 08/14/23 0217 08/16/23 0324 08/19/23 0312 08/20/23 0448 08/21/23 1400 08/22/23 0314  HGB 8.5*   < > 9.0* 8.9* 10.0* 10.0* 10.3*  MCV 92.4   < > 91.4 90.6 91.5 94.5 91.7  FERRITIN 140  --   --   --   --   --   --   TIBC 308  --   --   --   --   --   --   IRON 50  --   --   --   --   --   --    < > = values in this interval not displayed.    Cardiac Enzymes: No results for input(s): "CKTOTAL", "CKMB", "CKMBINDEX", "TROPONINI" in the last 168  hours. CBG: Recent Labs  Lab 08/22/23 0609 08/22/23 1129 08/22/23 1544 08/22/23 2053 08/23/23 0635  GLUCAP 144* 273* 227* 175* 118*    Iron Studies:  No results for input(s): "IRON", "TIBC", "TRANSFERRIN", "FERRITIN" in the last 72 hours.  Studies/Results: No results found.   Medications: Infusions:    Scheduled Medications:  aspirin EC  81 mg Oral Daily   atorvastatin  20 mg Oral Daily   calcitRIOL  0.25 mcg Oral Daily   carvedilol  3.125 mg Oral BID   Chlorhexidine Gluconate Cloth  6 each Topical Q0600   clopidogrel  75 mg Oral Daily   cyanocobalamin  500 mcg Oral Daily   darbepoetin (ARANESP) injection - NON-DIALYSIS  200 mcg Subcutaneous Q Fri-1800   feeding supplement  237 mL Oral TID BM   gabapentin  300 mg Oral QHS   heparin injection (subcutaneous)  5,000 Units Subcutaneous Q8H   heparin sodium (porcine)  3,200 Units Intracatheter Once   insulin aspart  0-6 Units Subcutaneous TID WC   insulin glargine  7 Units Subcutaneous QHS   isosorbide mononitrate  30 mg Oral Daily   latanoprost  1 drop Both Eyes QHS   levothyroxine  50 mcg Oral Daily   multivitamin  1 tablet Oral QHS   sevelamer carbonate  800 mg Oral TID WC   tamsulosin  0.4 mg Oral QPC breakfast    have reviewed scheduled and prn medications.  Physical Exam: General:NAD, comfortable Heart:RRR, s1s2 nl Lungs:clear b/l, no crackle Abdomen:soft, Non-tender, non-distended Extremities: Bilateral lower extremities edema++ Neurology: Alert, awake and following commands Access: RIJ TC, lt BCF (3/19) great thrill and bruit  Phillip [name] W 08/23/2023,11:54 AM  LOS: 11 days

## 2023-08-23 NOTE — Progress Notes (Signed)
 Received patient in bed.Awake,alert and oriented x 4.Consent verified.  Access used : Right HD catheter that worked well. Dressing on date.  Duration of treatment: 3 hours.  Net uf goal : 1 liter.  Tolerated treatment: Yes.  Hand off to the patient's nurse.

## 2023-08-24 DIAGNOSIS — I2510 Atherosclerotic heart disease of native coronary artery without angina pectoris: Secondary | ICD-10-CM | POA: Diagnosis not present

## 2023-08-24 DIAGNOSIS — E1169 Type 2 diabetes mellitus with other specified complication: Secondary | ICD-10-CM | POA: Diagnosis not present

## 2023-08-24 DIAGNOSIS — E785 Hyperlipidemia, unspecified: Secondary | ICD-10-CM

## 2023-08-24 DIAGNOSIS — N179 Acute kidney failure, unspecified: Secondary | ICD-10-CM | POA: Diagnosis not present

## 2023-08-24 DIAGNOSIS — I5023 Acute on chronic systolic (congestive) heart failure: Secondary | ICD-10-CM | POA: Diagnosis not present

## 2023-08-24 DIAGNOSIS — E440 Moderate protein-calorie malnutrition: Secondary | ICD-10-CM

## 2023-08-24 LAB — GLUCOSE, CAPILLARY
Glucose-Capillary: 109 mg/dL — ABNORMAL HIGH (ref 70–99)
Glucose-Capillary: 171 mg/dL — ABNORMAL HIGH (ref 70–99)
Glucose-Capillary: 186 mg/dL — ABNORMAL HIGH (ref 70–99)
Glucose-Capillary: 174 mg/dL — ABNORMAL HIGH (ref 70–99)

## 2023-08-24 NOTE — Assessment & Plan Note (Signed)
Continue with nutritional supplement.  

## 2023-08-24 NOTE — Progress Notes (Addendum)
 Pa Progress Note   Patient: Phillip [name] EAV:[id number] DOB: 1955/09/14 DOA: 08/11/2023     12 DOS: the patient was seen and examined on 08/24/2023   Brief hospital course: Mr. Norville was admitted to the hospital with the working diagnosis of progressive renal failure, now ESRD.   67/M with hypertension, type 2 diabetes mellitus, CKD 5, CAD/CABG, chronic systolic CHF, hypothyroidism presented to the ED with worsening shortness of breath and swelling, with orthopnea.  Followed by nephrology in [facility], has been reluctant to consider dialysis. -Also recently underwent right and left heart cath 2/28 for TAVR workup. -In the ED noted to be significantly volume overloaded, hemoglobin 9.8, creatinine 6.1, BNP 685, chest x-ray with hypoinflation -Admitted, started on high-dose diuretics, seen by cards and nephrology, initially refused dialysis, but then agreed.Cards signed off, recommended valve workup following stability with dialysis -3/17: Tunneled right IJ HD cath placed, started HD -3/18 AVF creation  Assessment and Plan: * Acute on chronic systolic CHF (congestive heart failure) (HCC) Echocardiogram from 2023 with reduced LV systolic function with EF 35 to 40%, moderate dilatation of LV cavity, RV with moderate reduction in systolic function, RVSP 80.6 mmHg  Mild to moderate mitral regurgitation, mild TR, mild to moderate aortic stenosis with low flow AS.  LV akinetic posterior wall, hypokinetic antero lateral wall, inferior septum entire inferior wall, apical lateral segment and apex.   Patient failed IV diuretic therapy and required renal replacement therapy for volume management.  Patient has lost about 14 Kg since admission.   Medical therapy with carvedilol and isosorbide.  Ok to discontinue telemetry.   Acute kidney injury superimposed on chronic kidney disease (HCC) Patient has progressed to ESRD, now on renal replacement therapy with intermittent hemodialysis.  03/17 started  HD 03/19 AV fistula creation.  Continue renal replacement therapy TTS scheduled, pending CLIP for outpatient HD.   Anemia of chronic renal disease, continue with EPO.  Positive thrombocytopenia at 123, leukopenia has resolved.   Metabolic bone disease continue with calcitriol and sevelamer.   CAD (coronary artery disease) CABG in 2003.  Cardiac catheterization 07/2023 with patent LIMA to LAD, with all 3 vein grafts occluded.  Continue medical therapy with clopidogrel, aspirin and statin.  He is chest pain free.   Essential hypertension Continue blood pressure control with carvedilol and isosorbide.  Holding on clonidine and amlodipine.   Type 2 diabetes mellitus with hyperlipidemia (HCC) Continue insulin sliding scale for glucose cover and monitoring.  Continue with statin therapy.   Hypothyroidism Continue with levothyroxine.   Malnutrition of moderate degree Continue with nutritional supplement.        Subjective: Patient is feeling well, no chest pain or dyspnea, lower extremity edema is improving, this morning he is seating in the chair.   Physical Exam: Vitals:   08/23/23 1957 08/24/23 0019 08/24/23 0414 08/24/23 0832  BP: (!) 100/59 122/71 (!) 142/80 115/68  Pulse: 92 96 95 97  Resp: 18 16 16 18   Temp: 98.8 F (37.1 C) 98.8 F (37.1 C) 98.9 F (37.2 C) 98.9 F (37.2 C)  TempSrc: Oral Oral Oral Oral  SpO2: 96% 95% 98% 100%  Weight:   85.2 kg   Height:       Neurology awake and alert ENT with mild pallor Cardiovascular with S1 and S2 present and regular with no gallops or rubs, positive systolic murmur at the apex No JVD Trace pitting lower extremity edema Respiratory with no rales or wheezing, no rhonchi Abdomen with no distention  Data Reviewed:    Family Communication: no family at the bedside   Disposition: Status is: Inpatient Remains inpatient appropriate because: pending HD CLIP   Planned Discharge Destination:  Home     Author: Coralie Keens, MD 08/24/2023 8:59 AM  For on call review [email or web address].

## 2023-08-24 NOTE — Progress Notes (Signed)
  [facility] PROGRESS [facility]  Assessment/ Plan: Pt is a 68 y.o. yo male with HTN, DM, CKD 5 thought to be due to diabetic nephropathy followed by Dr. Janit Pagan at [facility] with a recent baseline creatinine level seems to be around 5, CHF with preserved EF, CAD status post CABG, presented with worsening dyspnea on exertion, lower extremity edema worsening for 3 to 4 weeks.    #Acute kidney injury on CKD 5 with fluid overload/CHF exacerbation now progressing to ESRD: CKD thought to be due to diabetic nephropathy, follows with nephrologist in [facility].  This sounds like progressive CKD; initially was resistant to starting dialysis but came around after multiple counseling sessions. Appreciate RIJ TDC by VIR on 3/17, tolerated HD #1 3/17 w/ 1L net UF. HD #2 Tues 2L, #3 on Th1.3L (cramping)  TTS regimen for now; likely close to EDW (190 lbs). Tolerated HD on Sat with 1L net UF.  Appreciate Dr. Chestine Spore placing the left BCF on 3/19 (great thrill and bruit); CLIP in process.  Lives in [place] and referral submitted to [facility].   CXR no evidence of active TB 3/18. Quantiferon neg (sent 3/18).  Patient denies any cough, fever, hemoptysis.   #Acute on chronic CHF: Cardiology seeing,  diuretics.  Lasix 120 q 8 and metolazone 5 daily  stopped with decr UOP with HD.   #Hypertension/volume: Continue antihypertensives and volume management with diuretics.  Continue fluid restriction and low-sodium diet.   #Anemia of CKD: Iron saturation 16% therefore ordered IV iron Venofer 200 3/12-3/14) and Aranesp (120 3/14).     #CKD-MBD/hyperphosphatemia: PTH 332 - and started sevelamer as a phosphorus binders and low dose calcitriol. Recheck phos 3/21 3.3.  Subjective: Seen and examined at bedside.  Doing well today, asking to go home  Vitals:   08/23/23 1957 08/24/23 0019 08/24/23 0414 08/24/23 0832  BP: (!) 100/59 122/71 (!) 142/80 115/68  Pulse: 92 96 95 97  Resp: 18 16 16 18   Temp:  98.8 F (37.1 C) 98.8 F (37.1 C) 98.9 F (37.2 C) 98.9 F (37.2 C)  TempSrc: Oral Oral Oral Oral  SpO2: 96% 95% 98% 100%  Weight:   85.2 kg   Height:       Weight change: -1.428 kg  Intake/Output Summary (Last 24 hours) at 08/24/2023 1119 Last data filed at 08/24/2023 0800 Gross per 24 hour  Intake 340 ml  Output 1001 ml  Net -661 ml       Labs: RENAL PANEL Recent Labs  Lab 08/18/23 0301 08/19/23 0312 08/20/23 0448 08/21/23 0249 08/22/23 1135  NA 138 137 136 136  --   K 3.6 3.4* 3.4* 4.3  --   CL 101 98 96* 96*  --   CO2 24 28 30 28   --   GLUCOSE 128* 171* 139* 202*  --   BUN 145* 101* 64* 75*  --   CREATININE 6.07* 4.68* 3.84* 4.60*  --   CALCIUM 8.0* 8.2* 8.5* 8.7*  --   PHOS  --   --   --   --  3.3    Liver Function Tests: No results for input(s): "AST", "ALT", "ALKPHOS", "BILITOT", "PROT", "ALBUMIN" in the last 168 hours.  No results for input(s): "LIPASE", "AMYLASE" in the last 168 hours. No results for input(s): "AMMONIA" in the last 168 hours. CBC: Recent Labs    08/13/23 0224 08/14/23 0217 08/16/23 0324 08/19/23 0312 08/20/23 0448 08/21/23 1400 08/22/23 0314  HGB 8.5*   < >  9.0* 8.9* 10.0* 10.0* 10.3*  MCV 92.4   < > 91.4 90.6 91.5 94.5 91.7  FERRITIN 140  --   --   --   --   --   --   TIBC 308  --   --   --   --   --   --   IRON 50  --   --   --   --   --   --    < > = values in this interval not displayed.    Cardiac Enzymes: No results for input(s): "CKTOTAL", "CKMB", "CKMBINDEX", "TROPONINI" in the last 168 hours. CBG: Recent Labs  Lab 08/23/23 0635 08/23/23 1343 08/23/23 1620 08/23/23 2025 08/24/23 0545  GLUCAP 118* 108* 211* 189* 109*    Iron Studies:  No results for input(s): "IRON", "TIBC", "TRANSFERRIN", "FERRITIN" in the last 72 hours.  Studies/Results: No results found.   Medications: Infusions:    Scheduled Medications:  aspirin EC  81 mg Oral Daily   atorvastatin  20 mg Oral Daily   calcitRIOL  0.25 mcg  Oral Daily   carvedilol  3.125 mg Oral BID   Chlorhexidine Gluconate Cloth  6 each Topical Q0600   clopidogrel  75 mg Oral Daily   cyanocobalamin  500 mcg Oral Daily   darbepoetin (ARANESP) injection - NON-DIALYSIS  200 mcg Subcutaneous Q Fri-1800   feeding supplement  237 mL Oral TID BM   gabapentin  300 mg Oral QHS   heparin injection (subcutaneous)  5,000 Units Subcutaneous Q8H   insulin aspart  0-6 Units Subcutaneous TID WC   insulin glargine  7 Units Subcutaneous QHS   isosorbide mononitrate  30 mg Oral Daily   latanoprost  1 drop Both Eyes QHS   levothyroxine  50 mcg Oral Daily   multivitamin  1 tablet Oral QHS   sevelamer carbonate  800 mg Oral TID WC   tamsulosin  0.4 mg Oral QPC breakfast    have reviewed scheduled and prn medications.  Physical Exam: General:NAD, comfortable Heart:RRR, s1s2 nl Lungs:clear b/l, no crackle Abdomen:soft, Non-tender, non-distended Extremities: Bilateral lower extremities edema tr Neurology: Alert, awake and following commands Access: RIJ TC, lt BCF (3/19) great thrill and bruit  Zaylynn Rickett W 08/24/2023,11:19 AM  LOS: 12 days

## 2023-08-24 NOTE — Progress Notes (Signed)
 Mobility Specialist Progress Note:   08/24/23 1120  Mobility  Activity Ambulated with assistance in hallway  Level of Assistance Contact guard assist, steadying assist  Assistive Device Cane  Distance Ambulated (ft) 220 ft  Activity Response Tolerated well  Mobility Referral Yes  Mobility visit 1 Mobility  Mobility Specialist Start Time (ACUTE ONLY) 1120  Mobility Specialist Stop Time (ACUTE ONLY) 1135  Mobility Specialist Time Calculation (min) (ACUTE ONLY) 15 min   Pt agreeable to mobility session. Required only minG assist throughout ambulation with cane. No LOB or unsteadiness noted. No c/o throughout. Back in chair with all needs met.   Addison Lank Mobility Specialist Please contact via SecureChat or  Rehab office at [phone]

## 2023-08-24 NOTE — Assessment & Plan Note (Addendum)
 Patient has progressed to ESRD, now on renal replacement therapy with intermittent hemodialysis.  03/17 started HD 03/19 AV fistula creation.  Continue renal replacement therapy TTS scheduled.   Anemia of chronic renal disease, continue with EPO.  Positive thrombocytopenia at 123, leukopenia has resolved.   Metabolic bone disease continue with calcitriol and sevelamer.

## 2023-08-24 NOTE — Assessment & Plan Note (Signed)
 Continue blood pressure control with carvedilol and isosorbide.  Holding on clonidine and amlodipine.

## 2023-08-24 NOTE — Assessment & Plan Note (Addendum)
 Echocardiogram from 2023 with reduced LV systolic function with EF 35 to 40%, moderate dilatation of LV cavity, RV with moderate reduction in systolic function, RVSP 80.6 mmHg  Mild to moderate mitral regurgitation, mild TR, mild to moderate aortic stenosis with low flow AS.  LV akinetic posterior wall, hypokinetic antero lateral wall, inferior septum entire inferior wall, apical lateral segment and apex.   Patient failed IV diuretic therapy and required renal replacement therapy for volume management.  Patient has lost about 14 Kg since admission.   Medical therapy with carvedilol and isosorbide.  Ok to discontinue telemetry.

## 2023-08-24 NOTE — Plan of Care (Signed)
  Problem: Nutrition: Goal: Adequate nutrition will be maintained Outcome: Completed/Met   Problem: Coping: Goal: Level of anxiety will decrease Outcome: Completed/Met   Problem: Elimination: Goal: Will not experience complications related to bowel motility Outcome: Completed/Met Goal: Will not experience complications related to urinary retention Outcome: Completed/Met   Problem: Pain Managment: Goal: General experience of comfort will improve and/or be controlled Outcome: Completed/Met   Problem: Safety: Goal: Ability to remain free from injury will improve Outcome: Completed/Met

## 2023-08-24 NOTE — Assessment & Plan Note (Addendum)
Continue insulin sliding scale for glucose cover and monitoring Continue with statin therapy.

## 2023-08-24 NOTE — Assessment & Plan Note (Signed)
 CABG in 2003.  Cardiac catheterization 07/2023 with patent LIMA to LAD, with all 3 vein grafts occluded.  Continue medical therapy with clopidogrel, aspirin and statin.  He is chest pain free.

## 2023-08-24 NOTE — Plan of Care (Signed)
  Problem: Nutritional: Goal: Progress toward achieving an optimal weight will improve Outcome: Progressing

## 2023-08-24 NOTE — Assessment & Plan Note (Signed)
 Free T3 1,7 (low) and free T4 0,82 (normal).  Continue with levothyroxine.

## 2023-08-24 NOTE — Hospital Course (Signed)
 Phillip [name] was admitted to the hospital with the working diagnosis of progressive renal failure, now ESRD.   67/M with hypertension, type 2 diabetes mellitus, CKD 5, CAD/CABG, chronic systolic CHF, hypothyroidism presented to the ED with worsening shortness of breath and swelling, with orthopnea.  Followed by nephrology in [facility], has been reluctant to consider dialysis. -Also recently underwent right and left heart cath 2/28 for TAVR workup. -In the ED noted to be significantly volume overloaded, hemoglobin 9.8, creatinine 6.1, BNP 685, chest x-ray with hypoinflation -Admitted, started on high-dose diuretics, seen by cards and nephrology, initially refused dialysis, but then agreed.Cards signed off, recommended valve workup following stability with dialysis -3/17: Tunneled right IJ HD cath placed, started HD -3/18 AVF creation

## 2023-08-25 ENCOUNTER — Other Ambulatory Visit (HOSPITAL_COMMUNITY): Payer: Self-pay

## 2023-08-25 DIAGNOSIS — I5023 Acute on chronic systolic (congestive) heart failure: Secondary | ICD-10-CM | POA: Diagnosis not present

## 2023-08-25 DIAGNOSIS — I2510 Atherosclerotic heart disease of native coronary artery without angina pectoris: Secondary | ICD-10-CM | POA: Diagnosis not present

## 2023-08-25 DIAGNOSIS — I10 Essential (primary) hypertension: Secondary | ICD-10-CM | POA: Diagnosis not present

## 2023-08-25 DIAGNOSIS — N179 Acute kidney failure, unspecified: Secondary | ICD-10-CM | POA: Diagnosis not present

## 2023-08-25 LAB — RENAL FUNCTION PANEL
Albumin: 3.6 g/dL (ref 3.5–5.0)
Anion gap: 10 (ref 5–15)
BUN: 67 mg/dL — ABNORMAL HIGH (ref 8–23)
CO2: 28 mmol/L (ref 22–32)
Calcium: 9.3 mg/dL (ref 8.9–10.3)
Chloride: 95 mmol/L — ABNORMAL LOW (ref 98–111)
Creatinine, Ser: 5.23 mg/dL — ABNORMAL HIGH (ref 0.61–1.24)
GFR, Estimated: 11 mL/min — ABNORMAL LOW (ref >60)
Glucose, Bld: 123 mg/dL — ABNORMAL HIGH (ref 70–99)
Phosphorus: 4.5 mg/dL (ref 2.5–4.6)
Potassium: 4.7 mmol/L (ref 3.5–5.1)
Sodium: 133 mmol/L — ABNORMAL LOW (ref 135–145)

## 2023-08-25 LAB — GLUCOSE, CAPILLARY
Glucose-Capillary: 127 mg/dL — ABNORMAL HIGH (ref 70–99)
Glucose-Capillary: 142 mg/dL — ABNORMAL HIGH (ref 70–99)

## 2023-08-25 MED ORDER — GABAPENTIN 300 MG PO CAPS
300.0000 mg | ORAL_CAPSULE | Freq: Every day | ORAL | 0 refills | Status: DC
  Filled 2023-08-25: qty 30, 30d supply, fill #0

## 2023-08-25 MED ORDER — SEVELAMER CARBONATE 800 MG PO TABS
800.0000 mg | ORAL_TABLET | Freq: Three times a day (TID) | ORAL | 0 refills | Status: AC
  Filled 2023-08-25: qty 90, 30d supply, fill #0

## 2023-08-25 MED ORDER — CALCITRIOL 0.25 MCG PO CAPS
0.2500 ug | ORAL_CAPSULE | ORAL | 0 refills | Status: DC
  Filled 2023-08-25: qty 12, 28d supply, fill #0

## 2023-08-25 MED ORDER — CALCITRIOL 0.25 MCG PO CAPS: 0.2500 ug | ORAL_CAPSULE | ORAL | Status: DC

## 2023-08-25 NOTE — Progress Notes (Addendum)
 Pt has been accepted at [facility] on TTS 11:00 chair time. Pt can start tomorrow and will need to arrive at 10:30 to complete paperwork prior to treatment. Met with pt at bedside to provide above details. Pt agreeable to plans and schedule letter provided. Update provided to attending, nephrologist, pt's RN, and RN CM. Arrangements added to AVS as well. Will fax d/c summary to clinic once completed for continuation of care. Clinic advised pt will d/c today and should start tomorrow as planned.   Olivia Canter Renal Navigator [phone]  Addendum at 2:26 pm: D/C summary and today's renal note faxed to [facility] for continuation of care.

## 2023-08-25 NOTE — Progress Notes (Signed)
 [facility] PROGRESS NOTE  Assessment/ Plan: Pt is a 68 y.o. yo male with HTN, DM, CKD 5 thought to be due to diabetic nephropathy followed by Dr. Janit Pagan at [facility] with a recent baseline creatinine level seems to be around 5, CHF with preserved EF, CAD status post CABG, presented with worsening dyspnea on exertion, lower extremity edema worsening for 3 to 4 weeks.    # ESRD - AKI on CKD stage V with fluid overload/CHF exacerbation and now progressed to ESRD.  CKD thought to be due to diabetic nephropathy; follows with Dr. Janit Pagan in [facility]  Dr. Chestine Spore placed left BCF on 3/19.  Appreciate RIJ TDC by VIR on 3/17 and had his first HD on 3/17.   - HD per TTS schedule for now - per charting likely is close to his EDW.  Try EDW 85.1 kg.   - Awaiting a finalized outpatient HD unit.  CLIP in process.  Lives in [place] and referral submitted to [facility].   # Acute on chronic CHF with reduced EF- optimize volume status with HD.     #Hypertension/volume:  may need to lower anti-hypertensives over time.  Acceptable control     #Anemia of CKD: Iron saturation 16% and now s/p IV iron.  On aranesp 200 mcg weekly on Fridays - can lower      #CKD-MBD/hyperphosphatemia: transition calcitriol to TTS with HD - will be given outpatient with HD.  Please do not include on his medication list to avoid confusing the patient.   Subjective:    Strict ins/outs not available.  At least two unmeasured urine voids over 3/23.  He is hopeful to go home soon. Spoke with HD SW this AM - she is awaiting final confirmation for outpatient HD.   Review of systems:  Denies n/v Denies shortness of breath or chest pain    Vitals:   08/25/23 0400 08/25/23 0600 08/25/23 0740 08/25/23 0941  BP: 126/63  130/73 (!) 107/56  Pulse: 88  89   Resp:   16 18  Temp: 98.5 F (36.9 C)  97.7 F (36.5 C) 98.3 F (36.8 C)  TempSrc: Oral  Oral Oral  SpO2: 99%  100%   Weight:  85.1 kg    Height:        Weight change: -0.2 kg  Intake/Output Summary (Last 24 hours) at 08/25/2023 1002 Last data filed at 08/25/2023 0545 Gross per 24 hour  Intake 340 ml  Output 2 ml  Net 338 ml       Labs: RENAL PANEL Recent Labs  Lab 08/19/23 0312 08/20/23 0448 08/21/23 0249 08/22/23 1135 08/25/23 0316  NA 137 136 136  --  133*  K 3.4* 3.4* 4.3  --  4.7  CL 98 96* 96*  --  95*  CO2 28 30 28   --  28  GLUCOSE 171* 139* 202*  --  123*  BUN 101* 64* 75*  --  67*  CREATININE 4.68* 3.84* 4.60*  --  5.23*  CALCIUM 8.2* 8.5* 8.7*  --  9.3  PHOS  --   --   --  3.3 4.5  ALBUMIN  --   --   --   --  3.6    Liver Function Tests: Recent Labs  Lab 08/25/23 0316  ALBUMIN 3.6    No results for input(s): "LIPASE", "AMYLASE" in the last 168 hours. No results for input(s): "AMMONIA" in the last 168 hours. CBC: Recent Labs  08/13/23 0224 08/14/23 0217 08/16/23 0324 08/19/23 0312 08/20/23 0448 08/21/23 1400 08/22/23 0314  HGB 8.5*   < > 9.0* 8.9* 10.0* 10.0* 10.3*  MCV 92.4   < > 91.4 90.6 91.5 94.5 91.7  FERRITIN 140  --   --   --   --   --   --   TIBC 308  --   --   --   --   --   --   IRON 50  --   --   --   --   --   --    < > = values in this interval not displayed.    Cardiac Enzymes: No results for input(s): "CKTOTAL", "CKMB", "CKMBINDEX", "TROPONINI" in the last 168 hours. CBG: Recent Labs  Lab 08/24/23 0545 08/24/23 1130 08/24/23 1632 08/24/23 2107 08/25/23 0607  GLUCAP 109* 171* 186* 174* 127*    Iron Studies:  No results for input(s): "IRON", "TIBC", "TRANSFERRIN", "FERRITIN" in the last 72 hours.  Studies/Results: No results found.   Medications: Infusions:    Scheduled Medications:  aspirin EC  81 mg Oral Daily   atorvastatin  20 mg Oral Daily   calcitRIOL  0.25 mcg Oral Daily   carvedilol  3.125 mg Oral BID   Chlorhexidine Gluconate Cloth  6 each Topical Q0600   clopidogrel  75 mg Oral Daily   cyanocobalamin  500 mcg Oral Daily   darbepoetin  (ARANESP) injection - NON-DIALYSIS  200 mcg Subcutaneous Q Fri-1800   feeding supplement  237 mL Oral TID BM   gabapentin  300 mg Oral QHS   heparin injection (subcutaneous)  5,000 Units Subcutaneous Q8H   insulin aspart  0-6 Units Subcutaneous TID WC   insulin glargine  7 Units Subcutaneous QHS   isosorbide mononitrate  30 mg Oral Daily   latanoprost  1 drop Both Eyes QHS   levothyroxine  50 mcg Oral Daily   multivitamin  1 tablet Oral QHS   sevelamer carbonate  800 mg Oral TID WC   tamsulosin  0.4 mg Oral QPC breakfast    have reviewed scheduled and prn medications.  Physical Exam:   General adult male in bed in no acute distress HEENT normocephalic atraumatic extraocular movements intact sclera anicteric Neck supple trachea midline Lungs clear to auscultation bilaterally normal work of breathing at rest  Heart S1S2 no rub Abdomen soft nontender nondistended Extremities trace to 1+ edema  Psych normal mood and affect Neuro - alert and oriented x 3 provides hx and follows commands  Access RIJ tunn catheter; left AVF with bruit and thrill    Estanislado Emms 08/25/2023, 10:26 AM  LOS: 13 days

## 2023-08-25 NOTE — TOC Transition Note (Addendum)
 Transition of Care [facility]) - Discharge Note   Patient Details  Name: Phillip [name] MRN: [id number] Date of Birth: Mar 14, 1956  Transition of Care [facility]) CM/SW Contact:  Leone Haven, RN Phone Number: 08/25/2023, 12:51 PM   Clinical Narrative:    For dc today, he states his BIL will transport him home today.  HD has been set up by Renal Navigator.   TOC Pharmacy to fill medications.     Barriers to Discharge: Continued Medical Work up   Patient Goals and CMS Choice Patient states their goals for this hospitalization and ongoing recovery are:: return home   Choice offered to / list presented to : NA      Discharge Placement                       Discharge Plan and Services Additional resources added to the After Visit Summary for     Discharge Planning Services: CM Consult Post Acute Care Choice: NA          DME Arranged: N/A DME Agency: NA       HH Arranged: NA          Social Drivers of Health (SDOH) Interventions SDOH Screenings   Food Insecurity: No Food Insecurity (08/12/2023)  Housing: Low Risk  (08/12/2023)  Transportation Needs: No Transportation Needs (08/12/2023)  Utilities: Not At Risk (08/12/2023)  Depression (PHQ2-9): Low Risk  (10/22/2018)  Financial Resource Strain: Low Risk  (09/26/2020)   Received from [facility], [facility]  Physical Activity: Inactive (09/26/2020)   Received from [facility], [facility]  Social Connections: Moderately Integrated (08/12/2023)  Stress: No Stress Concern Present (09/26/2020)   Received from [facility], [facility]  Tobacco Use: Medium Risk (08/20/2023)  Health Literacy: Low Risk  (09/26/2020)   Received from [facility], [facility]     Readmission Risk Interventions    08/13/2023    4:28 PM  Readmission Risk Prevention Plan  Transportation Screening Complete  HRI or Home Care Consult Complete  Palliative Care Screening Not Applicable  Medication Review (RN  Care Manager) Complete

## 2023-08-25 NOTE — Plan of Care (Signed)
  Problem: Health Behavior/Discharge Planning: Goal: Ability to identify and utilize available resources and services will improve Outcome: Progressing Goal: Ability to manage health-related needs will improve Outcome: Progressing

## 2023-08-25 NOTE — Discharge Summary (Addendum)
 Physician Discharge Summary   Patient: Phillip [name] MRN: [id number] DOB: 1956/05/08  Admit date:     08/11/2023  Discharge date: 08/25/23  Discharge Physician: York Ram Jermaine Tholl   PCP: Gordan Payment., MD   Recommendations at discharge:    Patient has bee started on renal replacement therapy with intermittent hemodialysis with scheduled Tuesday, Thursday and Saturday.  Clonidine and amlodipine have been discontinued.  Reduced dose of gabapentin to 300 mg at bedtime.  Added sevelamer and will receive calcitriol as outpatient on HD.  EPO as outpatient. Discontinue diuretics.  Follow up with Dr Shary Decamp in 7 to 10 days.  Follow up with Nephrology and Cardiology as scheduled.   Discharge Diagnoses: Principal Problem:   Acute on chronic systolic CHF (congestive heart failure) (HCC) Active Problems:   Acute kidney injury superimposed on chronic kidney disease (HCC)   CAD (coronary artery disease)   Essential hypertension   Type 2 diabetes mellitus with hyperlipidemia (HCC)   Hypothyroidism   Malnutrition of moderate degree  Resolved Problems:   * No resolved hospital problems. [facility] Course: Phillip [name] was admitted to the hospital with the working diagnosis of progressive renal failure, now ESRD.   68 yo male with past medical history of hypertension, type 2 diabetes mellitus, CKD 5, CAD/CABG, chronic systolic CHF, hypothyroidism presented to the ED with dyspnea, edema and orthopnea.  Followed by nephrology in [facility], has been reluctant to consider dialysis. Patient recently underwent right and left heart cath 2/28 for TAVR workup. Progressive lower extremity edema over the last 3 to 4 weeks prior to admission with weight gain about 27 lbs.  On his initial physical examination his blood pressure was 146/81, HR 55, RR 16 and 02 saturation 95%. Lungs with no wheezing or rhonchi, heart with S1 and S2 present and regular with no gallops or rubs, abdomen with no  distention, positive lower extremity edema pitting +++.   Na 141, K 5,0 Cl 108 , bicarbonate 21, glucose 112, bun 128 cr 6,17  BNP 865  Wbc 5,7 hgb 9,8 plt 115  TSH 9,8 PTH 332   Chest radiograph with hypoinflation, positive cardiomegaly, with bilateral hilar vascular congestion, positive fluid in the right fissure, no effusions or infiltrates, sternotomy wires in place.   EKG 67 bpm, right axis deviation, left posterior fascicular block, qtc 439, sinus rhythm with q wave lead III and aVF, no significant ST segment changes, negative T wave lead II, III, aVF, V5 and V6.   Admitted, started on high-dose diuretics, with no improvement in his volume status.  Worsening and progressive renal failure.  Decision was made to start renal replacement therapy.   3/17: Tunneled right IJ HD cath placed, started HD 3/19 AVF creation 03/24 patient has been tolerating well renal replacement therapy, he will continue with Tuesday, Thursday and Saturday scheduled.  Follow up as outpatient with nephrology and primary care.   Assessment and Plan: * Acute on chronic systolic CHF (congestive heart failure) (HCC) Echocardiogram 06/2023 with reduced LV systolic function with EF 30 to 35%, the inferior and inferior lateral segments are thinned ad akinetic, LA with moderate dilatation, RV with preserved systolic function. Low flow low gradient severe aortic valve stenosis, severe pulmonary hypertension with pulmonary systolic pressure 68 mmHg, mild TR and mild MR.   Acute on chronic core pulmonale.  Pulmonary hypertension.   Patient failed IV diuretic therapy and required renal replacement therapy for volume management.  Patient has lost about 14 Kg since  admission.   Medical therapy with carvedilol and isosorbide.  Continue volume management with ultrafiltration on hemodialysis.   Acute kidney injury superimposed on chronic kidney disease (HCC) Patient has progressed to ESRD, now on renal replacement therapy  with intermittent hemodialysis.  03/17 started HD 03/19 AV fistula creation.  Continue renal replacement therapy TTS scheduled.   Anemia of chronic renal disease, continue with EPO.  Positive thrombocytopenia at 123, leukopenia has resolved.   Metabolic bone disease continue with calcitriol and sevelamer.   CAD (coronary artery disease) CABG in 2003.  Cardiac catheterization 07/2023 with patent LIMA to LAD, with all 3 vein grafts occluded.  Continue medical therapy with clopidogrel, aspirin and statin.  He is chest pain free.   Essential hypertension Continue blood pressure control with carvedilol and isosorbide.  Discontinue clonidine and amlodipine.   Type 2 diabetes mellitus with hyperlipidemia (HCC) During his hospitalization he was placed on insulin sliding scale for glucose cover and monitoring.   His glucose remained stable, at the time of his discharge his fasting glucose was 123 mg/dl.   Continue with statin therapy.    Hypothyroidism Free T3 1,7 (low) and free T4 0,82 (normal).  Continue with levothyroxine.   Malnutrition of moderate degree Continue with nutritional supplement.          Consultants: Nephrology cardiology vascular surgery and interventional radiology  Procedures performed: tunneled catheter for HD and AV fistula creation   Disposition: Home Diet recommendation:  Cardiac and Carb modified diet DISCHARGE MEDICATION: Allergies as of 08/25/2023   No Known Allergies      Medication List     STOP taking these medications    amLODipine 5 MG tablet Commonly known as: NORVASC   bumetanide 2 MG tablet Commonly known as: BUMEX   cloNIDine 0.3 MG tablet Commonly known as: CATAPRES   gabapentin 600 MG tablet Commonly known as: NEURONTIN Replaced by: gabapentin 300 MG capsule       TAKE these medications    acetaminophen 500 MG tablet Commonly known as: TYLENOL Take 1,000 mg by mouth every 6 (six) hours as needed for moderate  pain or headache.   aspirin EC 81 MG tablet Take 1 tablet (81 mg total) by mouth daily. Swallow whole.   atorvastatin 20 MG tablet Commonly known as: LIPITOR Take 1 tablet (20 mg total) by mouth daily.   Basaglar KwikPen 100 UNIT/ML Inject 7 Units into the skin at bedtime.   carvedilol 3.125 MG tablet Commonly known as: COREG Take 1 tablet (3.125 mg total) by mouth 2 (two) times daily.   clopidogrel 75 MG tablet Commonly known as: Plavix Take 1 tablet (75 mg total) by mouth daily.   cyanocobalamin 500 MCG tablet Commonly known as: VITAMIN B12 Take 500 mcg by mouth daily.   cyclobenzaprine 10 MG tablet Commonly known as: FLEXERIL Take 10 mg by mouth 3 (three) times daily as needed for muscle spasms.   gabapentin 300 MG capsule Commonly known as: NEURONTIN Take 1 capsule (300 mg total) by mouth at bedtime. Replaces: gabapentin 600 MG tablet   isosorbide mononitrate 120 MG 24 hr tablet Commonly known as: IMDUR Take 1 tablet by mouth once daily   Lancets Misc 1 each by miscellaneous route 2 (two) times a day.   latanoprost 0.005 % ophthalmic solution Commonly known as: XALATAN Place 1 drop into both eyes at bedtime.   levothyroxine 50 MCG tablet Commonly known as: SYNTHROID Take 50 mcg by mouth daily.   nitroGLYCERIN 0.4 MG SL tablet  Commonly known as: NITROSTAT Place 1 tablet (0.4 mg total) under the tongue every 5 (five) minutes as needed for chest pain.   sevelamer carbonate 800 MG tablet Commonly known as: RENVELA Take 1 tablet (800 mg total) by mouth 3 (three) times daily with meals.   tamsulosin 0.4 MG Caps capsule Commonly known as: FLOMAX Take 0.4 mg by mouth daily after breakfast.   TGT Blood Glucose Monitoring w/Device Kit 3 (three) times daily.   Vitamin D (Ergocalciferol) 1.25 MG (50000 UNIT) Caps capsule Commonly known as: DRISDOL Take 50,000 Units by mouth once a week. Mondays        Follow-up Information     Gordan Payment., MD. Call  in 1 week(s).   Specialty: Internal Medicine Contact information: [address]  [place] [address] [phone]         [facility] Follow up in 5 week(s).   Specialty: Vascular Surgery Contact information: [address] [place] [address] [phone]        [facility] Go on 08/26/2023.   Why: Schedule is Tuesdsay, Thursday, Saturday with 11:00 am chair time.  On Tuesday (3/25), please arrive at 10:30 am to complete paperwork prior to treatment. Contact information: [address] [place], [place] [address] [phone]               Discharge Exam: Filed Weights   08/23/23 1253 08/24/23 0414 08/25/23 0600  Weight: 85.3 kg 85.2 kg 85.1 kg   BP 119/64 (BP Location: Right Arm)   Pulse 87   Temp 98.4 F (36.9 C) (Oral)   Resp 16   Ht 6' (1.829 m)   Wt 85.1 kg   SpO2 100%   BMI 25.44 kg/m    Condition at discharge: stable  The results of significant diagnostics from this hospitalization (including imaging, microbiology, ancillary and laboratory) are listed below for reference.   Imaging Studies: DG CHEST PORT 1 VIEW Addendum Date: 08/22/2023 ADDENDUM REPORT: 08/22/2023 13:48 ADDENDUM: Addendum requested regarding a comment on whether there is any evidence of active tuberculosis. There is no evidence for active tuberculosis on this portable chest x-ray. Electronically Signed   By: Carey Bullocks M.D.   On: 08/22/2023 13:48   Result Date: 08/22/2023 CLINICAL DATA:  Persistent cough. EXAM: PORTABLE CHEST 1 VIEW COMPARISON:  Radiographs 08/11/2023 and 11/06/2020. Chest CT 12/08/2013. FINDINGS: 0953 hours. Right IJ hemodialysis catheter projects over the mid right atrium. The heart size and mediastinal contours are stable status post median sternotomy and CABG. Unchanged low lung volumes with mild vascular congestion. No overt pulmonary edema, confluent airspace disease,  pneumothorax or significant pleural effusion. The bones appear unchanged. IMPRESSION: Unchanged low lung volumes with mild vascular congestion. No evidence of pneumonia or overt pulmonary edema. Electronically Signed: By: Carey Bullocks M.D. On: 08/19/2023 11:59   VAS [place] UPPER EXT VEIN MAPPING (PRE-OP AVF) Result Date: 08/18/2023 UPPER EXTREMITY VEIN MAPPING Patient Name:  Phillip [name]  Date of Exam:   08/18/2023 Medical Rec #: [id number]         Accession #:    [id number] Date of Birth: 14-Feb-1956        Patient Gender: M Patient Age:   47 years Exam Location:  [facility] Procedure:      VAS [place] UPPER EXT VEIN MAPPING (PRE-OP AVF) Referring Phys: Paulene Floor --------------------------------------------------------------------------------  Indications: Pre-op AVF. Comparison Study: None Performing Technologist: Shona Simpson  Examination Guidelines:  A complete evaluation includes B-mode imaging, spectral Doppler, color Doppler, and power Doppler as needed of all accessible portions of each vessel. Bilateral testing is considered an integral part of a complete examination. Limited examinations for reoccurring indications may be performed as noted. +-----------------+-------------+----------+---------+ Right Cephalic   Diameter (cm)Depth (cm)Findings  +-----------------+-------------+----------+---------+ Shoulder             0.27        1.20             +-----------------+-------------+----------+---------+ Prox upper arm       0.20        0.96             +-----------------+-------------+----------+---------+ Mid upper arm        0.18        0.36             +-----------------+-------------+----------+---------+ Dist upper arm       0.18        0.38             +-----------------+-------------+----------+---------+ Antecubital fossa    0.34        0.39   branching +-----------------+-------------+----------+---------+ Prox forearm         0.26        0.59   branching  +-----------------+-------------+----------+---------+ Mid forearm          0.09        0.46             +-----------------+-------------+----------+---------+ Dist forearm         0.26        0.24             +-----------------+-------------+----------+---------+ Wrist                0.16        0.23             +-----------------+-------------+----------+---------+ +-----------------+-------------+----------+--------------+ Right Basilic    Diameter (cm)Depth (cm)   Findings    +-----------------+-------------+----------+--------------+ Shoulder                                not visualized +-----------------+-------------+----------+--------------+ Prox upper arm                          not visualized +-----------------+-------------+----------+--------------+ Mid upper arm                           not visualized +-----------------+-------------+----------+--------------+ Dist upper arm       0.44        1.10                  +-----------------+-------------+----------+--------------+ Antecubital fossa    0.37        0.46     branching    +-----------------+-------------+----------+--------------+ Prox forearm         0.28        0.35                  +-----------------+-------------+----------+--------------+ Mid forearm          0.23        0.34                  +-----------------+-------------+----------+--------------+ Distal forearm       0.15        0.20                  +-----------------+-------------+----------+--------------+  Wrist                0.13        0.29                  +-----------------+-------------+----------+--------------+ +-----------------+-------------+----------+---------+ Left Cephalic    Diameter (cm)Depth (cm)Findings  +-----------------+-------------+----------+---------+ Shoulder             0.30        1.36             +-----------------+-------------+----------+---------+ Prox upper arm       0.19         0.78             +-----------------+-------------+----------+---------+ Mid upper arm        0.21        0.33             +-----------------+-------------+----------+---------+ Dist upper arm       0.18        0.57   branching +-----------------+-------------+----------+---------+ Antecubital fossa    0.38        0.47   branching +-----------------+-------------+----------+---------+ Prox forearm         0.28        0.47             +-----------------+-------------+----------+---------+ Mid forearm          0.17        0.30   branching +-----------------+-------------+----------+---------+ Dist forearm         0.16        0.22             +-----------------+-------------+----------+---------+ Wrist                0.19        0.22             +-----------------+-------------+----------+---------+ +-----------------+-------------+----------+--------------+ Left Basilic     Diameter (cm)Depth (cm)   Findings    +-----------------+-------------+----------+--------------+ Shoulder                                not visualized +-----------------+-------------+----------+--------------+ Prox upper arm       0.35        1.20                  +-----------------+-------------+----------+--------------+ Mid upper arm        0.24        1.00     branching    +-----------------+-------------+----------+--------------+ Dist upper arm       0.26        0.71                  +-----------------+-------------+----------+--------------+ Antecubital fossa    0.31        0.63                  +-----------------+-------------+----------+--------------+ Prox forearm         0.16        0.28     branching    +-----------------+-------------+----------+--------------+ Mid forearm          0.96        0.23                  +-----------------+-------------+----------+--------------+ Distal forearm       0.09        0.25                   +-----------------+-------------+----------+--------------+  Wrist                0.07        0.17                  +-----------------+-------------+----------+--------------+ *See table(s) above for measurements and observations.  Diagnosing physician: Gerarda Fraction Electronically signed by Gerarda Fraction on 08/18/2023 at 5:34:02 PM.    Final    VAS [place] UPPER EXTREMITY ARTERIAL DUPLEX Result Date: 08/18/2023  UPPER EXTREMITY DUPLEX STUDY Patient Name:  Phillip [name]  Date of Exam:   08/18/2023 Medical Rec #: [id number]         Accession #:    [id number] Date of Birth: 05/11/1956        Patient Gender: M Patient Age:   37 years Exam Location:  [facility] Procedure:      VAS [place] UPPER EXTREMITY ARTERIAL DUPLEX Referring Phys: EMMA COLLINS --------------------------------------------------------------------------------  Indications: AVF Pre-op.  Risk Factors: Hypertension, hyperlipidemia, Diabetes, prior MI. Limitations: None Comparison Study: None. Performing Technologist: Shona Simpson  Examination Guidelines: A complete evaluation includes B-mode imaging, spectral Doppler, color Doppler, and power Doppler as needed of all accessible portions of each vessel. Bilateral testing is considered an integral part of a complete examination. Limited examinations for reoccurring indications may be performed as noted.  Right Doppler Findings: +--------------+----------+--------+--------+--------+ Site          PSV (cm/s)WaveformStenosisComments +--------------+----------+--------+--------+--------+ Subclavian Mid65                                 +--------------+----------+--------+--------+--------+ Axillary      52                                 +--------------+----------+--------+--------+--------+ Brachial Dist 64                                 +--------------+----------+--------+--------+--------+ Radial Dist   66                                  +--------------+----------+--------+--------+--------+ Ulnar Dist    66                                 +--------------+----------+--------+--------+--------+   Left Doppler Findings: +--------------+----------+--------+-------------+--------+ Site          PSV (cm/s)WaveformStenosis     Comments +--------------+----------+--------+-------------+--------+ Subclavian Mid66                                      +--------------+----------+--------+-------------+--------+ Axillary      129               <50% stenosis         +--------------+----------+--------+-------------+--------+ Brachial Dist 85                                      +--------------+----------+--------+-------------+--------+ Radial Dist   79                                      +--------------+----------+--------+-------------+--------+  Ulnar Dist    57                                      +--------------+----------+--------+-------------+--------+   Summary:  Left: <50% stenosis visualized in the left upper extremity.       Obstruction noted in the axillary artery. *See table(s) above for measurements and observations. Electronically signed by Gerarda Fraction on 08/18/2023 at 5:33:51 PM.    Final    IR Fluoro Guide CV Line Right Result Date: 08/18/2023 CLINICAL DATA:  Renal failure, needs durable venous access for planned hemodialysis EXAM: TUNNELED HEMODIALYSIS CATHETER PLACEMENT WITH ULTRASOUND AND FLUOROSCOPIC GUIDANCE TECHNIQUE: The procedure, risks, benefits, and alternatives were explained to the patient. Questions regarding the procedure were encouraged and answered. The patient understands and consents to the procedure. As antibiotic prophylaxis, cefazolin 2 g was ordered pre-procedure and administered intravenously within one hour of incision.Patency of the right IJ vein was confirmed with ultrasound with image documentation. An appropriate skin site was determined. Region was prepped using maximum  barrier technique including cap and mask, sterile gown, sterile gloves, large sterile sheet, and Chlorhexidine as cutaneous antisepsis. The region was infiltrated locally with 1% lidocaine. Intravenous Fentanyl and Versed 1mg  were administered by RN during a total moderate (conscious) sedation time of 10 minutes; the patient's level of consciousness and physiological / cardiorespiratory status were monitored continuously by radiology RN under my direct supervision. Under real-time ultrasound guidance, the right IJ vein was accessed with a 21 gauge micropuncture needle; the needle tip within the vein was confirmed with ultrasound image documentation. Needle exchanged over the 018 guidewire for transitional dilator, which allowed advancement of a Benson wire into the IVC. Over this, an MPA catheter was advanced. A Palindrome 19 hemodialysis catheter was tunneled from the right anterior chest wall approach to the right IJ dermatotomy site. The MPA catheter was exchanged over an Amplatz wire for serial vascular dilators which allow placement of a peel-away sheath, through which the catheter was advanced under intermittent fluoroscopy, positioned with its tips in the proximal and midright atrium. Spot chest radiograph confirms good catheter position. No pneumothorax. Catheter was flushed and primed per protocol. Catheter secured externally with O Prolene sutures. The right IJ dermatotomy site was closed with Dermabond. COMPLICATIONS: COMPLICATIONS None immediate FLUOROSCOPY: Radiation Exposure Index (as provided by the fluoroscopic device): 5 mGy air Kerma COMPARISON:  None Available. IMPRESSION: 1. Technically successful placement of tunneled right IJ hemodialysis catheter with ultrasound and fluoroscopic guidance. Ready for routine use. ACCESS: Remains approachable for percutaneous intervention as needed. Electronically Signed   By: Corlis Leak M.D.   On: 08/18/2023 09:53   IR US Guide Vasc Access Right Result  Date: 08/18/2023 CLINICAL DATA:  Renal failure, needs durable venous access for planned hemodialysis EXAM: TUNNELED HEMODIALYSIS CATHETER PLACEMENT WITH ULTRASOUND AND FLUOROSCOPIC GUIDANCE TECHNIQUE: The procedure, risks, benefits, and alternatives were explained to the patient. Questions regarding the procedure were encouraged and answered. The patient understands and consents to the procedure. As antibiotic prophylaxis, cefazolin 2 g was ordered pre-procedure and administered intravenously within one hour of incision.Patency of the right IJ vein was confirmed with ultrasound with image documentation. An appropriate skin site was determined. Region was prepped using maximum barrier technique including cap and mask, sterile gown, sterile gloves, large sterile sheet, and Chlorhexidine as cutaneous antisepsis. The region was infiltrated locally with 1% lidocaine. Intravenous Fentanyl and  Versed 1mg  were administered by RN during a total moderate (conscious) sedation time of 10 minutes; the patient's level of consciousness and physiological / cardiorespiratory status were monitored continuously by radiology RN under my direct supervision. Under real-time ultrasound guidance, the right IJ vein was accessed with a 21 gauge micropuncture needle; the needle tip within the vein was confirmed with ultrasound image documentation. Needle exchanged over the 018 guidewire for transitional dilator, which allowed advancement of a Benson wire into the IVC. Over this, an MPA catheter was advanced. A Palindrome 19 hemodialysis catheter was tunneled from the right anterior chest wall approach to the right IJ dermatotomy site. The MPA catheter was exchanged over an Amplatz wire for serial vascular dilators which allow placement of a peel-away sheath, through which the catheter was advanced under intermittent fluoroscopy, positioned with its tips in the proximal and midright atrium. Spot chest radiograph confirms good catheter  position. No pneumothorax. Catheter was flushed and primed per protocol. Catheter secured externally with O Prolene sutures. The right IJ dermatotomy site was closed with Dermabond. COMPLICATIONS: COMPLICATIONS None immediate FLUOROSCOPY: Radiation Exposure Index (as provided by the fluoroscopic device): 5 mGy air Kerma COMPARISON:  None Available. IMPRESSION: 1. Technically successful placement of tunneled right IJ hemodialysis catheter with ultrasound and fluoroscopic guidance. Ready for routine use. ACCESS: Remains approachable for percutaneous intervention as needed. Electronically Signed   By: Corlis Leak M.D.   On: 08/18/2023 09:53   DG Chest 2 View Result Date: 08/11/2023 CLINICAL DATA:  Dyspnea EXAM: CHEST - 2 VIEW COMPARISON:  11/06/2020 FINDINGS: Lung volumes are small, but are symmetric and are clear. No pneumothorax or pleural effusion. Coronary artery bypass grafting has been performed. Cardiac size is at the upper limits of normal. Rim vascularity is normal. No acute bone abnormality. IMPRESSION: 1. Pulmonary hypoinflation.  Borderline cardiomegaly. Electronically Signed   By: Helyn Numbers M.D.   On: 08/11/2023 21:44   CARDIAC CATHETERIZATION Result Date: 08/01/2023   Origin to Prox Graft lesion is 100% stenosed.   Origin to Prox Graft lesion is 100% stenosed.   Origin to Prox Graft lesion is 100% stenosed.   Ost LAD to Prox LAD lesion is 100% stenosed.   Ost RCA to Prox RCA lesion is 100% stenosed.   Mid Cx lesion is 99% stenosed. 1.  Severe native vessel disease. 2.  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  These presumably go to the diagonal, PDA, and obtuse marginal systems. 3.  Widely patent LIMA to LAD. 4.  Fick cardiac output of 6.9 L/min and Fick cardiac index of 3.0 L/min/m with the following hemodynamics:  Right atrial pressure mean of 16 mmHg  Right ventricular pressure 74/-1 with an end-diastolic pressure of 18 mmHg  PA pressure of 66/21 with a mean of 39 mmHg  Wedge  pressure mean of 28 mmHg with V waves to 33 mmHg  PVR 1.6 Wood units  PA pulsatility index of 2.5 5.  Total dye expenditure of 40 cc Recommendation: Continue evaluation for potential aortic valve intervention.    Microbiology: Results for orders placed or performed during the hospital encounter of 11/06/20  Resp Panel by RT-PCR (Flu A&B, Covid) Nasopharyngeal Swab     Status: None   Collection Time: 11/06/20  2:30 PM   Specimen: Nasopharyngeal Swab; Nasopharyngeal(NP) swabs in vial transport medium  Result Value Ref Range Status   SARS Coronavirus 2 by RT PCR NEGATIVE NEGATIVE Final    Comment: (NOTE) SARS-CoV-2 target nucleic acids  are NOT DETECTED.  The SARS-CoV-2 RNA is generally detectable in upper respiratory specimens during the acute phase of infection. The lowest concentration of SARS-CoV-2 viral copies this assay can detect is 138 copies/mL. A negative result does not preclude SARS-Cov-2 infection and should not be used as the sole basis for treatment or other patient management decisions. A negative result may occur with  improper specimen collection/handling, submission of specimen other than nasopharyngeal swab, presence of viral mutation(s) within the areas targeted by this assay, and inadequate number of viral copies(<138 copies/mL). A negative result must be combined with clinical observations, patient history, and epidemiological information. The expected result is Negative.  Fact Sheet for Patients:  BloggerCourse.com  Fact Sheet for Healthcare Providers:  SeriousBroker.it  This test is no t yet approved or cleared by the [place] FDA and  has been authorized for detection and/or diagnosis of SARS-CoV-2 by FDA under an Emergency Use Authorization (EUA). This EUA will remain  in effect (meaning this test can be used) for the duration of the COVID-19 declaration under Section 564(b)(1) of the Act,  21 U.S.C.section 360bbb-3(b)(1), unless the authorization is terminated  or revoked sooner.       Influenza A by PCR NEGATIVE NEGATIVE Final   Influenza B by PCR NEGATIVE NEGATIVE Final    Comment: (NOTE) The Xpert Xpress SARS-CoV-2/FLU/RSV plus assay is intended as an aid in the diagnosis of influenza from Nasopharyngeal swab specimens and should not be used as a sole basis for treatment. Nasal washings and aspirates are unacceptable for Xpert Xpress SARS-CoV-2/FLU/RSV testing.  Fact Sheet for Patients: BloggerCourse.com  Fact Sheet for Healthcare Providers: SeriousBroker.it  This test is not yet approved or cleared by the [place] FDA and has been authorized for detection and/or diagnosis of SARS-CoV-2 by FDA under an Emergency Use Authorization (EUA). This EUA will remain in effect (meaning this test can be used) for the duration of the COVID-19 declaration under Section 564(b)(1) of the Act, 21 U.S.C. section 360bbb-3(b)(1), unless the authorization is terminated or revoked.  Performed at [facility], [address], [place], [place] [address]   Culture, Urine     Status: Abnormal   Collection Time: 11/07/20  4:57 AM   Specimen: Urine, Random  Result Value Ref Range Status   Specimen Description URINE, RANDOM  Final   Special Requests   Final    NONE Performed at [facility], [address], [place], [place] [address]    Culture MULTIPLE SPECIES PRESENT, SUGGEST RECOLLECTION (A)  Final   Report Status 11/08/2020 FINAL  Final  Culture, blood (routine x 2)     Status: None   Collection Time: 11/07/20  6:45 AM   Specimen: BLOOD  Result Value Ref Range Status   Specimen Description BLOOD RIGHT ANTECUBITAL  Final   Special Requests   Final    AEROBIC BOTTLE ONLY Blood Culture results may not be optimal due to an inadequate volume of blood received in culture bottles   Culture   Final    NO GROWTH 5  DAYS Performed at [facility], [address] [place], [place] [address]    Report Status 11/12/2020 FINAL  Final  Culture, blood (routine x 2)     Status: None   Collection Time: 11/07/20  6:53 AM   Specimen: BLOOD  Result Value Ref Range Status   Specimen Description BLOOD BLOOD LEFT HAND  Final   Special Requests   Final  BOTTLES DRAWN AEROBIC AND ANAEROBIC Blood Culture adequate volume   Culture   Final    NO GROWTH 5 DAYS Performed at [facility], [address] [place], [place] [address]    Report Status 11/12/2020 FINAL  Final  MRSA PCR Screening     Status: None   Collection Time: 11/09/20  8:54 AM   Specimen: Nasal Mucosa; Nasopharyngeal  Result Value Ref Range Status   MRSA by PCR NEGATIVE NEGATIVE Final    Comment:        The GeneXpert MRSA Assay (FDA approved for NASAL specimens only), is one component of a comprehensive MRSA colonization surveillance program. It is not intended to diagnose MRSA infection nor to guide or monitor treatment for MRSA infections. Performed at [facility], [address] [place], [place] [address]     Labs: CBC: Recent Labs  Lab 08/19/23 [phone] 08/20/23 0448 08/21/23 1400 08/22/23 0314  WBC 8.0 7.7 9.4 9.6  HGB 8.9* 10.0* 10.0* 10.3*  HCT 27.0* 30.3* 30.8* 32.1*  MCV 90.6 91.5 94.5 91.7  PLT 122* 129* 130* 123*   Basic Metabolic Panel: Recent Labs  Lab 08/19/23 0312 08/20/23 0448 08/21/23 0249 08/22/23 1135 08/25/23 0316  NA 137 136 136  --  133*  K 3.4* 3.4* 4.3  --  4.7  CL 98 96* 96*  --  95*  CO2 28 30 28   --  28  GLUCOSE 171* 139* 202*  --  123*  BUN 101* 64* 75*  --  67*  CREATININE 4.68* 3.84* 4.60*  --  5.23*  CALCIUM 8.2* 8.5* 8.7*  --  9.3  PHOS  --   --   --  3.3 4.5   Liver Function Tests: Recent Labs  Lab 08/25/23 0316  ALBUMIN 3.6   CBG: Recent Labs  Lab 08/24/23 1130 08/24/23 1632 08/24/23 2107 08/25/23 0607 08/25/23 1111  GLUCAP 171* 186* 174* 127* 142*     Discharge time spent: greater than 30 minutes.  Signed: Coralie Keens, MD [facility] 08/25/2023

## 2023-08-25 NOTE — Progress Notes (Signed)
 Mobility Specialist Progress Note:   08/25/23 1000  Mobility  Activity Ambulated with assistance in hallway  Level of Assistance Contact guard assist, steadying assist  Assistive Device Cane  Distance Ambulated (ft) 250 ft  Activity Response Tolerated well  Mobility Referral Yes  Mobility visit 1 Mobility  Mobility Specialist Start Time (ACUTE ONLY) 1000  Mobility Specialist Stop Time (ACUTE ONLY) 1013  Mobility Specialist Time Calculation (min) (ACUTE ONLY) 13 min   Pt agreeable to mobility session. Required only minG assist throughout ambulation with cane. No overt LOB noted. Pt continues to display generalized weakness in BLE. No c/o throughout, back in chair with all needs met.   Addison Lank Mobility Specialist Please contact via SecureChat or  Rehab office at [phone]

## 2023-08-27 ENCOUNTER — Telehealth: Payer: Self-pay | Admitting: Cardiology

## 2023-08-27 DIAGNOSIS — I2510 Atherosclerotic heart disease of native coronary artery without angina pectoris: Secondary | ICD-10-CM

## 2023-08-27 MED ORDER — ISOSORBIDE MONONITRATE ER 120 MG PO TB24: 120.0000 mg | ORAL_TABLET | Freq: Every day | ORAL | 3 refills | Status: DC

## 2023-08-27 NOTE — Telephone Encounter (Signed)
*  STAT* If patient is at the pharmacy, call can be transferred to refill team.   1. Which medications need to be refilled? (please list name of each medication and dose if known) isosorbide mononitrate (IMDUR) 120 MG 24 hr tablet   2. Which pharmacy/location (including street and city if local pharmacy) is medication to be sent to?  [facility] [address] [place], [place] - [address]    3. Do they need a 30 day or 90 day supply? 90

## 2023-09-20 NOTE — Progress Notes (Signed)
 Patient ID: Phillip [name] MRN: [id number] DOB/AGE: [birth year] 68 y.o.  Primary Care Physician:Grisso, Murvin Arthurs., MD Primary Cardiologist: Revankar  CC:  Aortic valvular disease management     FOCUSED PROBLEM LIST:   AS AVA 0.9, MG 28, V-max 3.1, SVI 23, EF 30 to 35% OSH TTE January 2025 EKG sinus rhythm with IVCD CAD 4V CABG 2003 All vein grafts occluded; LIMA to LAD patent cath 2025 Ischemic cardiomyopathy Inferior and inferolateral akinesis, EF 30 to 35% OSH TTE January 2025 T2DM On insulin  Hypertension Hyperlipidemia Aortic atherosclerosis CT abdomen pelvis 2022 ESRD on HD PAD Status post left tibioperoneal, peroneal, and popliteal drug-coated balloon angioplasty April 2024 BMI 31  I discussed the use of AI software during this interview and the patient agrees:  HPI: 2/25: The patient is a 68 year old male with the above listed medical problems referred by NP [facility] for recommendations regarding the patient's low-flow low gradient severe aortic stenosis.  The patient had been hospitalized at [facility] for heart failure exacerbation with a 20 pound weight gain.  An echocardiogram demonstrated an ejection fraction of 30 to 35% with low-flow low gradient severe aortic stenosis and severe pulmonary hypertension.  He was aggressively diuresed with Bumex  and discharged home.   He is experiencing worsening shortness of breath and leg swelling, which led to a recent hospitalization. These symptoms have been present for about a month, with the shortness of breath worsening over time. He was able to mow the lawn and walk during the summer, but now he struggles with walking even short distances without becoming short of breath. Activities such as taking a shower exacerbate his shortness of breath, while brushing his teeth does not. No chest pain or discomfort.  He has a history of coronary artery disease, having undergone quadruple bypass surgery approximately twenty years  ago. He was recently hospitalized for shortness of breath and swelling, and an ultrasound at the hospital indicated a worn-out valve and weak heart muscle. His leg swelling was previously above the knee but has since improved slightly, though it remains significant.  He has a history of diabetes managed with insulin  and hypertension controlled with medication. He is not currently on cholesterol medication or aspirin . He has known kidney issues and has been advised to manage his kidney health.  He lives with his wife, who also has health issues, including hip and back problems. He supports his wife in managing her health. 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  Plan: Refer for coronary angiography and right heart catheterization.  April 2025:  Patient consents to use of AI scribe. In the interim he underwent coronary bypass angiography which demonstrated patent LIMA to LAD however all of his vein grafts were occluded.  A few weeks later, he unfortunately developed a heart failure exacerbation and was admitted in March.  He ended up starting intermittent hemodialysis as directed by nephrology.  He experiences shortness of breath related to his aortic stenosis but remains able to perform daily activities to some extent. No chest pain, lightheadedness, or syncope. He was previously informed that his heart valve is deteriorating, leading to a weakening of the heart muscle.  He recently began dialysis three times a week due to end-stage renal disease, which has improved his breathing by removing excess fluid. Initially resistant to dialysis, he now acknowledges its necessity. He is not  currently driving or engaging in many activities since starting dialysis.  He was recently hospitalized for gallbladder issues, where gallstones were identified. He experienced significant pain and  burping, leading to his admission to [facility]. A planned cholecystectomy was postponed pending evaluation of his heart condition.    Past Medical History:  Diagnosis Date   Abdominopelvic abscess (HCC) 09/29/2018   Abscess of appendix 09/22/2018   Acquired spondylolisthesis of lumbosacral region 03/20/2020   Anemia of chronic disease 08/18/2019   Aortic atherosclerosis (HCC)    Aortic stenosis    a.) TTE 09/15/2018: mild-mod (MPG 19); b.) TTE 03/21/2021: mild-mod (MPG 14.3); c.) TTE 04/24/2022: mild- mod (MPG 15)   Appendicitis 09/08/2018   Asthma    Benign hypertension with CKD (chronic kidney disease) stage IV (HCC) 02/24/2018   Benign prostatic hyperplasia without lower urinary tract symptoms 01/14/2018   Bilateral lower extremity edema 11/04/2018   Bradycardia 03/25/2018   Bruit of right carotid artery 07/26/2015   CAD (coronary artery disease) 2003   a.) s/p 4v CABG 2003   Cardiac murmur 07/26/2015   Chronic combined systolic and diastolic CHF (congestive heart failure) (HCC) 07/26/2015   a.) TTE 09/15/2018: EF 50-55%, mod LVH, RVE, BAE, mild-mod TR, AoV sclerosis, G1DD; b.) MPI 10/27/2019: EF <30%; c.) TTE 03/21/2021: EF 35-40%, post AK, inf HK, mod LVH, mod red RVSF, mod LAE, mod Aov sclerosis, G2DD; c.) TTE 04/24/2022: EF 35-40%, post AK, glok HK, mod LVE, mod red RVSF, mild-mod MR, Aov sclerosis, G3DD   Chronic idiopathic constipation 11/02/2018   Chronic low back pain without sciatica 12/02/2018   Chronic pain of right hip 03/31/2020   CKD (chronic kidney disease) stage 4, GFR 15-29 ml/min (HCC) 10/07/2018   Coronary artery disease    Dysphagia 07/26/2015   Erectile dysfunction 07/14/2017   Essential hypertension 02/24/2018   Foot ulcer, left (HCC)    GERD (gastroesophageal reflux disease)    Hematuria 07/27/2015   History of marijuana use    Hyperlipidemia 07/26/2015   Hypertension    Hypothyroidism 11/14/2015   Insomnia 11/02/2018   Lumbar spondylosis  03/20/2020   Malaise and fatigue 03/25/2018   Myocardial infarction due to demand ischemia (HCC) 09/26/2020   a.) Type II NSTEMI; b.) troponins were trended 0.54 --> 0.56 --> 0.52 ng/mL   Non-compliance 05/05/2019   Osseous and subluxation stenosis of intervertebral foramina of lumbar region 03/20/2020   Osteoarthritis 10/06/2019   Peripheral vascular disease (HCC)    Personal history of tobacco use, presenting hazards to health 09/27/2020   Polyneuropathy in diabetes (HCC) 05/07/2019   Pulmonary HTN (HCC)    a.) TTE 05/12/2019: PASP 71; b.) TTE 09/27/2020: PASP >70; c.) TTE 03/21/2021: RVSP 43; d.) TTE 04/24/2022: RVSP 80.6   Puncture wound of right hip 04/02/2018   PVD (peripheral vascular disease) with claudication (HCC)    a.) s/p PTA 03/02/2018 - balloon angioplasty LEFT below knee popliteal artery; b.) s/p PTA 09/30/2022: baloon angioplasty LEFT tibioperoneal trunck, most proximal peroneal artery, and LEFT popliteal artery.   S/P CABG x 4 2003   Sleep apnea 07/26/2015   a.) does not require nocturnal PAP therapy   Subacute osteomyelitis of left foot (HCC) 03/25/2018   Thrombocytopenia (HCC)    Type 2 diabetes mellitus with stage 4 chronic kidney disease, with long-term current use of insulin  (HCC) 07/26/2015   Vitamin B12 deficiency 06/16/2019   Vitamin D deficiency 05/07/2018    Past Surgical History:  Procedure Laterality Date   AMPUTATION TOE Left 03/13/2018  Procedure: AMPUTATION TOE-MPJ;  Surgeon: Anell Baptist, DPM;  Location: [facility] ORS;  Service: Podiatry;  Laterality: Left;   ANGIOPLASTY     AV FISTULA PLACEMENT Left 08/20/2023   Procedure: ARTERIOVENOUS (AV) FISTULA CREATION;  Surgeon: Young Hensen, MD;  Location: [facility] OR;  Service: Vascular;  Laterality: Left;   CARDIAC CATHETERIZATION     CORONARY ARTERY BYPASS GRAFT N/A 2003   IR FLUORO GUIDE CV LINE RIGHT  08/18/2023   IR US  GUIDE VASC ACCESS RIGHT  08/18/2023   LAPAROSCOPIC APPENDECTOMY N/A 09/08/2018    Procedure: APPENDECTOMY LAPAROSCOPIC;  Surgeon: Alben Alma, MD;  Location: [facility] ORS;  Service: General;  Laterality: N/A;   LOWER EXTREMITY ANGIOGRAPHY Left 03/02/2018   Procedure: LOWER EXTREMITY ANGIOGRAPHY;  Surgeon: Celso College, MD;  Location: [facility] INVASIVE CV LAB;  Service: Cardiovascular;  Laterality: Left;   LOWER EXTREMITY ANGIOGRAPHY Left 09/30/2022   Procedure: Lower Extremity Angiography;  Surgeon: Celso College, MD;  Location: [facility] INVASIVE CV LAB;  Service: Cardiovascular;  Laterality: Left;   RIGHT/LEFT HEART CATH AND CORONARY/GRAFT ANGIOGRAPHY N/A 08/01/2023   Procedure: RIGHT/LEFT HEART CATH AND CORONARY/GRAFT ANGIOGRAPHY;  Surgeon: Kyra Phy, MD;  Location: [facility] INVASIVE CV LAB;  Service: Cardiovascular;  Laterality: N/A;   ROTATOR CUFF REPAIR Left     Family History  Problem Relation Age of Onset   Hyperlipidemia Mother    Hypertension Mother    Diabetes Mother    Heart attack Brother    Heart disease Brother    Lung disease Father     Social History   Socioeconomic History   Marital status: Married    Spouse name: Not on file   Number of children: Not on file   Years of education: Not on file   Highest education level: Not on file  Occupational History   Not on file  Tobacco Use   Smoking status: Former    Current packs/day: 0.00    Average packs/day: 0.5 packs/day for 25.0 years (12.5 ttl pk-yrs)    Types: Cigarettes    Start date: 24    Quit date: 2003    Years since quitting: 22.3   Smokeless tobacco: Never  Vaping Use   Vaping status: Never Used  Substance and Sexual Activity   Alcohol use: Not Currently    Comment: 2003   Drug use: Not Currently    Types: Marijuana   Sexual activity: Not on file  Other Topics Concern   Not on file  Social History Narrative   Not on file   Social Drivers of Health   Financial Resource Strain: Low Risk  (09/26/2020)   Received from [facility], [facility]   Overall Financial Resource Strain  (CARDIA)    Difficulty of Paying Living Expenses: Not hard at all  Food Insecurity: Low Risk  (09/12/2023)   Received from [facility]   Hunger Vital Sign    Worried About Running Out of Food in the Last Year: Never true    Ran Out of Food in the Last Year: Never true  Transportation Needs: No Transportation Needs (09/12/2023)   Received from [facility]    In the past 12 months, has lack of reliable transportation kept you from medical appointments, meetings, work or from getting things needed for daily living? : No  Physical Activity: Inactive (09/26/2020)   Received from [facility], [facility]   Exercise Vital Sign    Days of Exercise per  Week: 0 days    Minutes of Exercise per Session: 0 min  Stress: No Stress Concern Present (09/26/2020)   Received from [facility], [facility] - Occupational Stress Questionnaire    Feeling of Stress : Not at all  Social Connections: Moderately Integrated (08/12/2023)   Social Connection and Isolation Panel [NHANES]    Frequency of Communication with Friends and Family: More than three times a week    Frequency of Social Gatherings with Friends and Family: Once a week    Attends Religious Services: More than 4 times per year    Active Member of [employer] or Organizations: No    Attends Banker Meetings: Never    Marital Status: Married  Catering manager Violence: Not At Risk (08/12/2023)   Humiliation, Afraid, Rape, and Kick questionnaire    Fear of Current or Ex-Partner: No    Emotionally Abused: No    Physically Abused: No    Sexually Abused: No     Prior to Admission medications   Medication Sig Start Date End Date Taking? Authorizing Provider  acetaminophen  (TYLENOL ) 500 MG tablet Take 1,000 mg by mouth every 6 (six) hours as needed for moderate pain or headache.     [provider]  amLODipine  (NORVASC ) 5 MG tablet Take 1 tablet (5 mg total)  by mouth daily. 05/16/23 08/14/23  Terrance Ferretti, NP  Blood Glucose Monitoring Suppl (TGT BLOOD GLUCOSE MONITORING) w/Device KIT 3 (three) times daily. 09/20/21   [provider]  bumetanide  (BUMEX ) 2 MG tablet Take 1 tablet (2 mg total) by mouth 2 (two) times daily. Take an extra tablet for weight gain of 3 lbs or greater per day. 07/15/23   Terrance Ferretti, NP  carvedilol  (COREG ) 3.125 MG tablet Take 1 tablet (3.125 mg total) by mouth 2 (two) times daily. 05/20/23 08/18/23  Terrance Ferretti, NP  cloNIDine  (CATAPRES ) 0.3 MG tablet Take 1 tablet (0.3 mg total) by mouth at bedtime. 07/14/23   Terrance Ferretti, NP  clopidogrel  (PLAVIX ) 75 MG tablet Take 1 tablet (75 mg total) by mouth daily. 11/20/20   Revankar, Micael Adas, MD  cyclobenzaprine  (FLEXERIL ) 10 MG tablet Take 10 mg by mouth 3 (three) times daily as needed for muscle spasms.    [provider]  gabapentin  (NEURONTIN ) 600 MG tablet Take 600 mg by mouth 2 (two) times daily.    [provider]  Insulin  Glargine (BASAGLAR  KWIKPEN) 100 UNIT/ML Inject 7 Units into the skin at bedtime. 07/18/23 07/17/24  [provider]  isosorbide  mononitrate (IMDUR ) 120 MG 24 hr tablet Take 1 tablet (120 mg total) by mouth daily. 06/04/22   Revankar, Micael Adas, MD  Lancets MISC 1 each by miscellaneous route 2 (two) times a day. 09/20/21   [provider]  latanoprost  (XALATAN ) 0.005 % ophthalmic solution Place 1 drop into both eyes at bedtime. 04/15/23   [provider]  levothyroxine  (SYNTHROID ) 50 MCG tablet Take 50 mcg by mouth daily. 04/05/23   [provider]  nitroGLYCERIN  (NITROSTAT ) 0.4 MG SL tablet Place 1 tablet (0.4 mg total) under the tongue every 5 (five) minutes as needed for chest pain. 11/20/20   Revankar, Micael Adas, MD  tamsulosin  (FLOMAX ) 0.4 MG CAPS capsule Take 0.4 mg by mouth daily after breakfast. 04/19/20   [provider]  vitamin B-12 (CYANOCOBALAMIN ) 500 MCG tablet Take 500 mcg  by mouth daily.     [provider]  Vitamin D, Ergocalciferol, (DRISDOL) 1.25 MG (50000 UNIT) CAPS capsule Take 50,000 Units by mouth once a week. Mondays 07/13/21   [provider]    No Known Allergies  REVIEW OF SYSTEMS:  General: no fevers/chills/night sweats Eyes: no blurry vision, diplopia, or amaurosis ENT: no sore throat or hearing loss Resp: no cough, wheezing, or hemoptysis CV: no edema or palpitations GI: no abdominal pain, nausea, vomiting, diarrhea, or constipation GU: no dysuria, frequency, or hematuria Skin: no rash Neuro: no headache, numbness, tingling, or weakness of extremities Musculoskeletal: no joint pain or swelling Heme: no bleeding, DVT, or easy bruising Endo: no polydipsia or polyuria  BP 118/64   Pulse 76   Ht 6' (1.829 m)   Wt 186 lb (84.4 kg)   SpO2 96%   BMI 25.23 kg/m   PHYSICAL EXAM: GEN:  AO x 3 in no acute distress HEENT: normal Dentition: Dentures Neck: JVP normal. +2 carotid upstrokes without bruits. No thyromegaly. Lungs: equal expansion, clear bilaterally CV: Apex is discrete and nondisplaced, RRR with 3/6 SEM; R chest HD line Abd: soft, non-tender, non-distended; no bruit; positive bowel sounds Ext: +2 edema, no ecchymoses, or cyanosis; LUE fistula Vascular: 2+ femoral pulses, +2 right radial pulse and harvested left radial.      Skin: warm and dry without rash Neuro: CN II-XII grossly intact; motor and sensory grossly intact    DATA AND STUDIES:  EKG: EKG May 2024 demonstrates sinus rhythm with inferior infarction pattern with an IVCD  EKG Interpretation Date/Time:    Ventricular Rate:    PR Interval:    QRS Duration:    QT Interval:    QTC Calculation:   R Axis:      Text Interpretation:          Cardiac Studies & Procedures   ______________________________________________________________________________________________ CARDIAC CATHETERIZATION  CARDIAC CATHETERIZATION 08/01/2023  Conclusion    Origin to Prox Graft lesion is 100% stenosed.   Origin to Prox Graft lesion is 100% stenosed.   Origin to Prox Graft lesion is 100% stenosed.   Ost LAD to Prox LAD lesion is 100% stenosed.   Ost RCA to Prox RCA lesion is 100% stenosed.   Mid Cx lesion is 99% stenosed.  1.  Severe native vessel disease. 2.  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  These presumably go to the diagonal, PDA, and obtuse marginal systems. 3.  Widely patent LIMA to LAD. 4.  Fick cardiac output of 6.9 L/min and Fick cardiac index of 3.0 L/min/m with the following hemodynamics: Right atrial pressure mean of 16 mmHg Right ventricular pressure 74/-1 with an end-diastolic pressure of 18 mmHg PA pressure of 66/21 with a mean of 39 mmHg Wedge pressure mean of 28 mmHg with V waves to 33 mmHg PVR 1.6 Wood units PA pulsatility index of 2.5 5.  Total dye expenditure of 40 cc  Recommendation: Continue evaluation for potential aortic valve intervention.  Findings Coronary Findings Diagnostic  Dominance: Right  Left Anterior Descending Ost LAD to Prox LAD lesion is 100% stenosed.  Left Circumflex Mid Cx lesion is 99% stenosed.  Right Coronary Artery Ost RCA to Prox RCA lesion is 100% stenosed.  Right Posterior Descending Artery Collaterals RPDA filled by collaterals from Dist LAD.  First Right Posterolateral Branch  Second Right Posterolateral Branch Collaterals 2nd RPL filled by collaterals from 2nd Sept.  Third Right Posterolateral Branch Collaterals 3rd RPL filled by collaterals from 3rd Mrg.  Graft To 1st  Diag Origin to Prox Graft lesion is 100% stenosed.  Graft To 2nd Mrg Origin to Prox Graft lesion is 100% stenosed.  Graft To RPDA Origin to Prox Graft lesion is 100% stenosed.  LIMA Graft To Mid LAD  Intervention  No interventions have been documented.   STRESS TESTS  MYOCARDIAL PERFUSION IMAGING 10/27/2019  Narrative  The left ventricular ejection fraction is severely  decreased (<30%).  Nuclear stress EF: 23%.  There was no ST segment deviation noted during stress.  Defect 1: There is a medium defect of moderate severity present in the basal inferior, mid anterior, mid inferolateral, apical anterior, apical inferior and apical lateral location.  Findings consistent with prior myocardial infarction.  This is an intermediate risk study.  No evidence of ischemia. RV is enlarged and prominent.   ECHOCARDIOGRAM  ECHOCARDIOGRAM COMPLETE 04/24/2022  Narrative ECHOCARDIOGRAM REPORT    Patient Name:   Phillip [name] Date of Exam: 04/24/2022 Medical Rec #:  [id number]        Height:       72.0 in Accession #:    [id number]       Weight:       207.6 lb Date of Birth:  September 13, 1955       BSA:          2.165 m Patient Age:    65 years         BP:           138/72 mmHg Patient Gender: M                HR:           79 bpm. Exam Location:  [place]  Procedure: 2D Echo, Cardiac Doppler, Color Doppler and Strain Analysis  Indications:    Moderate aortic regurgitation [I35.1 (ICD-10-CM)]; Essential hypertension [I10 (ICD-10-CM)]  History:        Patient has prior history of Echocardiogram examinations, most recent 03/21/2021. CHF, CAD and Previous Myocardial Infarction, Aortic Valve Disease; Risk Factors:Dyslipidemia.  Sonographer:    Erminia Hazel RDCS Referring Phys: Pollie Bring [facility]   Sonographer Comments: Suboptimal subcostal window. IMPRESSIONS   1. Left ventricular ejection fraction, by estimation, is 35 to 40%. The left ventricle has moderately decreased function. The left ventricle demonstrates regional wall motion abnormalities (see scoring diagram/findings for description). The left ventricular internal cavity size was moderately dilated. Left ventricular diastolic parameters are consistent with Grade III diastolic dysfunction (restrictive). 2. Right ventricular systolic function is moderately reduced. The right ventricular size  is normal. There is severely elevated pulmonary artery systolic pressure. 3. The mitral valve is degenerative. Mild to moderate mitral valve regurgitation. No evidence of mitral stenosis. 4. Low flow AS . The aortic valve has an indeterminant number of cusps. There is mild calcification of the aortic valve. There is moderate thickening of the aortic valve. Aortic valve regurgitation is moderate. Mild to moderate aortic valve stenosis. 5. The inferior vena cava is dilated in size with <50% respiratory variability, suggesting right atrial pressure of 15 mmHg.  FINDINGS Left Ventricle: Left ventricular ejection fraction, by estimation, is 35 to 40%. The left ventricle has moderately decreased function. The left ventricle demonstrates regional wall motion abnormalities. Global longitudinal strain performed but not reported based on interpreter judgement due to suboptimal tracking. The left ventricular internal cavity size was moderately dilated. There is no left ventricular hypertrophy. Left ventricular diastolic parameters are consistent with Grade III diastolic dysfunction (restrictive).   LV Wall Scoring: The posterior  wall is akinetic. The antero-lateral wall, inferior septum, entire inferior wall, apical lateral segment, and apex are hypokinetic. The entire anterior wall and anterior septum are normal.  Right Ventricle: The right ventricular size is normal. No increase in right ventricular wall thickness. Right ventricular systolic function is moderately reduced. There is severely elevated pulmonary artery systolic pressure. The tricuspid regurgitant velocity is 4.26 m/s, and with an assumed right atrial pressure of 8 mmHg, the estimated right ventricular systolic pressure is 80.6 mmHg.  Left Atrium: Left atrial size was normal in size.  Right Atrium: Right atrial size was normal in size.  Pericardium: There is no evidence of pericardial effusion.  Mitral Valve: The mitral valve is  degenerative in appearance. There is moderate thickening of the anterior and posterior mitral valve leaflet(s). Mild mitral annular calcification. Mild to moderate mitral valve regurgitation. No evidence of mitral valve stenosis.  Tricuspid Valve: The tricuspid valve is normal in structure. Tricuspid valve regurgitation is mild . No evidence of tricuspid stenosis.  Aortic Valve: Low flow AS. The aortic valve has an indeterminant number of cusps. There is mild calcification of the aortic valve. There is moderate thickening of the aortic valve. Aortic valve regurgitation is moderate. Aortic regurgitation PHT measures 383 msec. Mild to moderate aortic stenosis is present. Aortic valve mean gradient measures 15.0 mmHg. Aortic valve peak gradient measures 27.2 mmHg. Aortic valve area, by VTI measures 1.04 cm.  Pulmonic Valve: The pulmonic valve was normal in structure. Pulmonic valve regurgitation is trivial. No evidence of pulmonic stenosis.  Aorta: The aortic arch was not well visualized and the aortic root and ascending aorta are structurally normal, with no evidence of dilitation.  Venous: A systolic blunting flow pattern is recorded from the right upper pulmonary vein. The inferior vena cava is dilated in size with less than 50% respiratory variability, suggesting right atrial pressure of 15 mmHg.  IAS/Shunts: No atrial level shunt detected by color flow Doppler.   LEFT VENTRICLE PLAX 2D LVIDd:         6.30 cm   Diastology LVIDs:         5.60 cm   LV e' medial:    4.24 cm/s LV PW:         1.00 cm   LV E/e' medial:  27.6 LV IVS:        1.30 cm   LV e' lateral:   5.98 cm/s LVOT diam:     2.00 cm   LV E/e' lateral: 19.6 LV SV:         50 LV SV Index:   23 LVOT Area:     3.14 cm   RIGHT VENTRICLE            IVC RV Basal diam:  3.80 cm    IVC diam: 2.20 cm RV Mid diam:    3.10 cm RV S prime:     6.85 cm/s TAPSE (M-mode): 1.0 cm  LEFT ATRIUM             Index        RIGHT ATRIUM            Index LA diam:        4.10 cm 1.89 cm/m   RA Area:     16.20 cm LA Vol (A2C):   62.5 ml 28.87 ml/m  RA Volume:   37.40 ml  17.28 ml/m LA Vol (A4C):   75.4 ml 34.83 ml/m LA Biplane Vol: 68.7 ml 31.74 ml/m AORTIC  VALVE AV Area (Vmax):    0.79 cm AV Area (Vmean):   0.79 cm AV Area (VTI):     1.04 cm AV Vmax:           261.00 cm/s AV Vmean:          185.000 cm/s AV VTI:            0.482 m AV Peak Grad:      27.2 mmHg AV Mean Grad:      15.0 mmHg LVOT Vmax:         65.90 cm/s LVOT Vmean:        46.500 cm/s LVOT VTI:          0.159 m LVOT/AV VTI ratio: 0.33 AI PHT:            383 msec  AORTA Ao Root diam: 3.60 cm Ao Asc diam:  3.70 cm  MITRAL VALVE                  TRICUSPID VALVE MV Area (PHT): 4.68 cm       TR Peak grad:   72.6 mmHg MV Decel Time: 162 msec       TR Vmax:        426.00 cm/s MR Peak grad:    69.9 mmHg MR Mean grad:    51.0 mmHg    SHUNTS MR Vmax:         418.00 cm/s  Systemic VTI:  0.16 m MR Vmean:        339.0 cm/s   Systemic Diam: 2.00 cm MR PISA:         0.57 cm MR PISA Eff ROA: 5 mm MR PISA Radius:  0.30 cm MV E velocity: 117.00 cm/s  Zoe Hinds MD Electronically signed by Zoe Hinds MD Signature Date/Time: 04/26/2022/3:26:27 PM    Final          ______________________________________________________________________________________________      07/14/2023: NT-Pro BNP 16,109 08/11/2023: B Natriuretic Peptide 865.0; TSH 7.948 08/22/2023: Hemoglobin 10.3; Platelets 123 08/25/2023: BUN 67; Creatinine, Ser 5.23; Potassium 4.7; Sodium 133   STS RISK CALCULATOR: Pending  NHYA CLASS: 2    ASSESSMENT AND PLAN:   1. Severe aortic stenosis   2. HFrEF (heart failure with reduced ejection fraction) (HCC)   3. Type 2 diabetes mellitus with complication, with long-term current use of insulin  (HCC)   4. Hypertension associated with diabetes (HCC)   5. Hyperlipidemia associated with type 2 diabetes mellitus (HCC)   6. Aortic  atherosclerosis (HCC)   7. Diabetes mellitus with ESRD (end-stage renal disease) (HCC)   8. PAD (peripheral artery disease) (HCC)   9. BMI 31.0-31.9,adult     Aortic stenosis: Will obtain CTA now that patient is on dialysis.  The patient requires a lap cholecystectomy.  He endorsed NYHA class II symptoms last saw him last.  He has not been exerting himself much since being discharged from the hospital given all he has been through.  Because of the need for surgery and the overall state of his valve I believe we should continue our evaluation for potential aortic valve intervention.  Will refer for TAVR CTA and cardiothoracic surgical appointment.  Believe in this relatively young patient that it makes sense to intervene on his aortic stenosis especially given the fact that if it gets worse he will not be able to tolerate dialysis.  This is a challenging situation in this young patient with a multitude of significant medical issues. Heart  failure with reduced ejection fraction: On hemodialysis for volume control now.  Optimize regimen once aortic stenosis has been managed. Type 2 diabetes mellitus: Continue aspirin  81 mg, atorvastatin  20 mg, defer ARB and Jardiance given end-stage renal disease. Hypertension: Continue carvedilol  3.125 mg twice daily.  Blood pressure is well-controlled today. Hyperlipidemia: Continue atorvastatin  20 mg daily. Aortic atherosclerosis: Continue aspirin  81 mg daily and atorvastatin  20 mg daily. ESRD: Followed by nephrology. Peripheral arterial disease: Followed by vascular surgery.  Continue medical therapy. Elevated BMI: Hopefully after aortic valve intervention he is to get back to being active.   I have personally reviewed the patients imaging data as summarized above.  I have reviewed the natural history of aortic stenosis with the patient and family members who are present today. We have discussed the limitations of medical therapy and the poor prognosis associated  with symptomatic aortic stenosis. We have also reviewed potential treatment options, including palliative medical therapy, conventional surgical aortic valve replacement, and transcatheter aortic valve replacement. We discussed treatment options in the context of this patient's specific comorbid medical conditions.   All of the patient's questions were answered today. Will make further recommendations based on the results of studies outlined above.   I spent 40 minutes reviewing all clinical data during and prior to this visit including all relevant imaging studies, laboratories, clinical information from other health systems and prior notes from both Cardiology and other specialties, interviewing the patient, conducting a complete physical examination, and coordinating care in order to formulate a comprehensive and personalized evaluation and treatment plan.   Jalayah Gutridge K Halsey Hammen, MD  09/22/2023 1:13 PM    [facility] [address] [place], [place], [place]  [address] Phone: [phone]; Fax: [phone]

## 2023-09-22 ENCOUNTER — Other Ambulatory Visit: Payer: Self-pay

## 2023-09-22 ENCOUNTER — Encounter: Payer: Self-pay | Admitting: Internal Medicine

## 2023-09-22 ENCOUNTER — Ambulatory Visit: Attending: Internal Medicine | Admitting: Internal Medicine

## 2023-09-22 VITALS — BP 118/64 | HR 76 | Ht 72.0 in | Wt 186.0 lb

## 2023-09-22 DIAGNOSIS — I5020 Unspecified systolic (congestive) heart failure: Secondary | ICD-10-CM | POA: Diagnosis not present

## 2023-09-22 DIAGNOSIS — E1122 Type 2 diabetes mellitus with diabetic chronic kidney disease: Secondary | ICD-10-CM

## 2023-09-22 DIAGNOSIS — E785 Hyperlipidemia, unspecified: Secondary | ICD-10-CM

## 2023-09-22 DIAGNOSIS — I700 Atherosclerosis of aorta: Secondary | ICD-10-CM

## 2023-09-22 DIAGNOSIS — N186 End stage renal disease: Secondary | ICD-10-CM

## 2023-09-22 DIAGNOSIS — E1159 Type 2 diabetes mellitus with other circulatory complications: Secondary | ICD-10-CM

## 2023-09-22 DIAGNOSIS — I350 Nonrheumatic aortic (valve) stenosis: Secondary | ICD-10-CM

## 2023-09-22 DIAGNOSIS — I152 Hypertension secondary to endocrine disorders: Secondary | ICD-10-CM

## 2023-09-22 DIAGNOSIS — E118 Type 2 diabetes mellitus with unspecified complications: Secondary | ICD-10-CM

## 2023-09-22 DIAGNOSIS — I739 Peripheral vascular disease, unspecified: Secondary | ICD-10-CM

## 2023-09-22 DIAGNOSIS — Z794 Long term (current) use of insulin: Secondary | ICD-10-CM

## 2023-09-22 DIAGNOSIS — Z6831 Body mass index (BMI) 31.0-31.9, adult: Secondary | ICD-10-CM

## 2023-09-22 DIAGNOSIS — E1169 Type 2 diabetes mellitus with other specified complication: Secondary | ICD-10-CM

## 2023-09-22 NOTE — Progress Notes (Addendum)
 Pre Surgical Assessment: 5 M Walk Test  39M=16.98ft  5 Meter Walk Test- trial 1: 6.86 seconds 5 Meter Walk Test- trial 2: 6.26 seconds 5 Meter Walk Test- trial 3: 5.96 seconds 5 Meter Walk Test Average: 6.36 seconds  ___________________________  Procedure Type: Isolated AVR Perioperative Outcome Estimate % Operative Mortality 7.48% Morbidity & Mortality 20.3% Stroke 3.8% Renal Failure NA Reoperation 6.61% Prolonged Ventilation 15.9% Deep Sternal Wound Infection 0.391% Long Hospital Stay (>14 days) 13% Short Hospital Stay (<6 days)* 21.5%

## 2023-09-22 NOTE — Patient Instructions (Signed)
 Medication Instructions:  Your physician recommends that you continue on your current medications as directed. Please refer to the Current Medication list given to you today.  *If you need a refill on your cardiac medications before your next appointment, please call your pharmacy*  Lab Work: NONE If you have labs (blood work) drawn today and your tests are completely normal, you will receive your results only by: MyChart Message (if you have MyChart) OR A paper copy in the mail If you have any lab test that is abnormal or we need to change your treatment, we will call you to review the results.  Testing/Procedures: TAVR Cts Someone will reach out to you to schedule  Follow-Up: At [facility], you and your health needs are our priority.  As part of our continuing mission to provide you with exceptional heart care, our providers are all part of one team.  This team includes your primary Cardiologist (physician) and Advanced Practice Providers or APPs (Physician Assistants and Nurse Practitioners) who all work together to provide you with the care you need, when you need it.  Your next appointment:   To Be Determined  Provider:   Lorie Rook, MD  We recommend signing up for the patient portal called "MyChart".  Sign up information is provided on this After Visit Summary.  MyChart is used to connect with patients for Virtual Visits (Telemedicine).  Patients are able to view lab/test results, encounter notes, upcoming appointments, etc.  Non-urgent messages can be sent to your provider as well.   To learn more about what you can do with MyChart, go to [email or web address].   Other Instructions      1st Floor: - Lobby - Registration  - Pharmacy  - Lab - Cafe  2nd Floor: - PV Lab - Diagnostic Testing (echo, CT, nuclear med)  3rd Floor: - Vacant  4th Floor: - TCTS (cardiothoracic surgery) - AFib Clinic - Structural Heart Clinic - Vascular Surgery  - Vascular  Ultrasound  5th Floor: - HeartCare Cardiology (general and EP) - Clinical Pharmacy for coumadin, hypertension, lipid, weight-loss medications, and med management appointments    Valet parking services will be available as well.

## 2023-09-23 ENCOUNTER — Telehealth: Payer: Self-pay | Admitting: Cardiology

## 2023-09-23 NOTE — Telephone Encounter (Signed)
 Patient is calling wanting to know Dr. Barclay Leyden input regarding the appt had with Dr. Lorie Rook yesterday. Please advise.

## 2023-09-24 NOTE — Telephone Encounter (Signed)
 Attempted to call Dorsey Gault per DPR and her voice mail was not set - up so I was unable to leave a message.

## 2023-09-25 NOTE — Telephone Encounter (Signed)
 Attempted to call Dorsey Gault per DPR and her voice mail was not set - up so I was unable to leave a message.

## 2023-09-25 NOTE — Telephone Encounter (Signed)
 Called Dorsey Gault and she reported that the patient was on dialysis 3 times per week. He had developed some pain in his abdomen and after evaluating him it was discovered that he need to have his gallbladder removed. Before he was to have his gallbladder removed it was decided that he needed to have one of his heart valves replaced so a referral was sent to Dr. Lorie Rook. Patient had an appointment with Dr. Lorie Rook and he was referred back to Dr. Lafayette Pierre. Abran Abrahams and the patient would like to know what the next steps are for the patient's care?

## 2023-09-26 NOTE — Telephone Encounter (Signed)
 Called Abran Abrahams and informed her of Pattricia Bores, NP's response below:  "Aortic stenosis: Will obtain CTA now that patient is on dialysis.  The patient requires a lap cholecystectomy.  He endorsed NYHA class II symptoms last saw him last.  He has not been exerting himself much since being discharged from the hospital given all he has been through.  Because of the need for surgery and the overall state of his valve I believe we should continue our evaluation for potential aortic valve intervention.  Will refer for TAVR CTA and cardiothoracic surgical appointment.  Believe in this relatively young patient that it makes sense to intervene on his aortic stenosis especially given the fact that if it gets worse he will not be able to tolerate dialysis.  This is a challenging situation in this young patient with a multitude of significant medical issues  This is per Dr.Thukkani on 09/22/23. He referred to Dr. Sherene Dilling.  He has a CT on 5/9 and will see Dr. Jurline Olmsted on 5/28."  [name] verbalized understanding and had no further questions at this time.

## 2023-09-30 ENCOUNTER — Encounter (HOSPITAL_COMMUNITY)

## 2023-09-30 ENCOUNTER — Encounter

## 2023-10-01 ENCOUNTER — Other Ambulatory Visit: Payer: Self-pay

## 2023-10-01 DIAGNOSIS — N184 Chronic kidney disease, stage 4 (severe): Secondary | ICD-10-CM

## 2023-10-06 ENCOUNTER — Ambulatory Visit (INDEPENDENT_AMBULATORY_CARE_PROVIDER_SITE_OTHER): Admitting: Physician Assistant

## 2023-10-06 ENCOUNTER — Ambulatory Visit (HOSPITAL_COMMUNITY)
Admission: RE | Admit: 2023-10-06 | Discharge: 2023-10-06 | Disposition: A | Discharge status: Home / Self Care | Source: Ambulatory Visit | Attending: Surgery | Admitting: Surgery

## 2023-10-06 VITALS — BP 118/69 | HR 67 | Temp 98.2°F | Wt 188.2 lb

## 2023-10-06 DIAGNOSIS — N186 End stage renal disease: Secondary | ICD-10-CM

## 2023-10-06 DIAGNOSIS — Z992 Dependence on renal dialysis: Secondary | ICD-10-CM

## 2023-10-06 DIAGNOSIS — N184 Chronic kidney disease, stage 4 (severe): Secondary | ICD-10-CM | POA: Diagnosis present

## 2023-10-06 NOTE — Progress Notes (Unsigned)
 POST OPERATIVE OFFICE NOTE    CC:  F/u for surgery  HPI:  Phillip [name] is a 68 y.o. male who is s/p *** on *** by Dr. Aaron Aas    Pt returns today for follow up.  Pt states ***   No Known Allergies  Current Outpatient Medications  Medication Sig Dispense Refill   acetaminophen  (TYLENOL ) 500 MG tablet Take 1,000 mg by mouth every 6 (six) hours as needed for moderate pain or headache.      aspirin  EC 81 MG tablet Take 1 tablet (81 mg total) by mouth daily. Swallow whole.     atorvastatin  (LIPITOR) 20 MG tablet Take 1 tablet (20 mg total) by mouth daily. 90 tablet 3   Blood Glucose Monitoring Suppl (TGT BLOOD GLUCOSE MONITORING) w/Device KIT 3 (three) times daily.     carvedilol  (COREG ) 3.125 MG tablet Take 1 tablet (3.125 mg total) by mouth 2 (two) times daily. 180 tablet 3   clopidogrel  (PLAVIX ) 75 MG tablet Take 1 tablet (75 mg total) by mouth daily. 90 tablet 3   cyclobenzaprine  (FLEXERIL ) 10 MG tablet Take 10 mg by mouth 3 (three) times daily as needed for muscle spasms.     gabapentin  (NEURONTIN ) 300 MG capsule Take 1 capsule (300 mg total) by mouth at bedtime. 30 capsule 0   Insulin  Glargine (BASAGLAR  KWIKPEN) 100 UNIT/ML Inject 7 Units into the skin at bedtime.     isosorbide  mononitrate (IMDUR ) 120 MG 24 hr tablet Take 1 tablet (120 mg total) by mouth daily. 90 tablet 3   Lancets MISC 1 each by miscellaneous route 2 (two) times a day.     latanoprost  (XALATAN ) 0.005 % ophthalmic solution Place 1 drop into both eyes at bedtime.     levothyroxine  (SYNTHROID ) 50 MCG tablet Take 50 mcg by mouth daily.     nitroGLYCERIN  (NITROSTAT ) 0.4 MG SL tablet Place 1 tablet (0.4 mg total) under the tongue every 5 (five) minutes as needed for chest pain. 25 tablet 6   tamsulosin  (FLOMAX ) 0.4 MG CAPS capsule Take 0.4 mg by mouth daily after breakfast.     vitamin B-12 (CYANOCOBALAMIN ) 500 MCG tablet Take 500 mcg by mouth daily.      Vitamin D, Ergocalciferol, (DRISDOL) 1.25 MG (50000 UNIT) CAPS  capsule Take 50,000 Units by mouth once a week. Mondays     No current facility-administered medications for this visit.     ROS:  See HPI  Physical Exam:  Incision:  *** Extremities:  *** Neuro: ***  Studies: Dialysis Duplex (10/06/2023) AVF                 PSV (cm/s)Flow Vol (mL/min)Comments  +--------------------+----------+-----------------+--------+  Native artery inflow   110           774                 +--------------------+----------+-----------------+--------+  AVF Anastomosis        348                               +--------------------+----------+-----------------+--------+     +------------+----------+-------------+----------+----------------+  OUTFLOW VEINPSV (cm/s)Diameter (cm)Depth (cm)    Describe      +------------+----------+-------------+----------+----------------+  Prox UA        239        0.47        0.61                     +------------+----------+-------------+----------+----------------+  Mid UA         243        0.59        0.26                     +------------+----------+-------------+----------+----------------+  Dist UA        168        0.59        0.26   competing branch  +------------+----------+-------------+----------+----------------+  AC Fossa       124        0.87        0.17                     +------------    Assessment/Plan:  This is a 68 y.o. male who is s/p: ***  -June 11   Deneise Finlay, [facility] [phone]   Clinic MD:  ***

## 2023-10-08 ENCOUNTER — Telehealth: Payer: Self-pay | Admitting: Physician Assistant

## 2023-10-08 NOTE — Telephone Encounter (Signed)
  [facility]   MULTIDISCIPLINARY HEART VALVE TEAM   Personally spoke to pt's nephrologist who cleared pt for pre TAVR scans. Recently started on HD and has been stable. CT scans are scheduled for 10/10/23. Okay to proceed.   Abagail Hoar PA-C  [facility]

## 2023-10-10 ENCOUNTER — Ambulatory Visit (HOSPITAL_COMMUNITY)
Admission: RE | Admit: 2023-10-10 | Discharge: 2023-10-10 | Disposition: A | Discharge status: Home / Self Care | Source: Ambulatory Visit | Attending: Cardiology | Admitting: Cardiology

## 2023-10-10 DIAGNOSIS — I350 Nonrheumatic aortic (valve) stenosis: Secondary | ICD-10-CM | POA: Insufficient documentation

## 2023-10-10 MED ORDER — METOPROLOL TARTRATE 5 MG/5ML IV SOLN: 10.0000 mg | Freq: Once | INTRAVENOUS | Status: DC | PRN

## 2023-10-10 MED ORDER — DILTIAZEM HCL 25 MG/5ML IV SOLN: 10.0000 mg | INTRAVENOUS | Status: DC | PRN

## 2023-10-10 MED ORDER — IOHEXOL 350 MG/ML SOLN
100.0000 mL | Freq: Once | INTRAVENOUS | Status: AC | PRN
  Administered 2023-10-10: 100 mL via INTRAVENOUS

## 2023-10-13 ENCOUNTER — Other Ambulatory Visit: Payer: Self-pay | Admitting: Physician Assistant

## 2023-10-29 ENCOUNTER — Encounter: Payer: Self-pay | Admitting: Surgery

## 2023-10-29 ENCOUNTER — Ambulatory Visit: Attending: Surgery | Admitting: Surgery

## 2023-10-29 VITALS — BP 107/61 | HR 72 | Resp 20 | Ht 72.0 in | Wt 186.0 lb

## 2023-10-29 DIAGNOSIS — I350 Nonrheumatic aortic (valve) stenosis: Secondary | ICD-10-CM

## 2023-10-29 NOTE — Progress Notes (Signed)
 Patient ID: Phillip [name], male   DOB: 28-Jan-1956, 68 y.o.   MRN: [id number]  [facility]   MULTIDISCIPLINARY HEART VALVE CLINIC         CARDIOTHORACIC SURGERY CONSULTATION REPORT  PCP is Abbe Hoard., MD Referring Provider is Alyssa Backbone, MD Primary Cardiologist is Nelia Balzarine, MD  Reason for consultation: Severe aortic stenosis  HPI:  The patient is a 68 year old gentleman with a history of hypertension, type 2 diabetes, hyperlipidemia, hypothyroidism, end-stage renal disease on hemodialysis, coronary disease status post CABG x 4 in 2003, chronic combined systolic and diastolic congestive heart failure, cholelithiasis and severe aortic stenosis who was referred for consideration of TAVR.  A 2D echocardiogram in January 2025 showed an ejection fraction of 30 to 35% with severe low-flow/low gradient aortic stenosis with a mean gradient of 28 mmHg.  Aortic valve area was 0.9 cm with an SVI of 23.  He presented in March with worsening lower extremity edema and congestive heart failure.  He was approaching hemodialysis and was treated with high-dose diuretics to remove volume but eventually required hemodialysis.  He had a left arm fistula started as well as a right IJ dialysis catheter which he is still being dialyzed through.  His graft has not matured yet.  The patient is here today with his wife and grandson.  His breathing and lower extremity edema have been much better since being started on dialysis.  He denies any chest pain or pressure.  He has had no dizziness or syncope.  He said that he has been tolerating dialysis well.  He was recently hospitalized for cholelithiasis and abdominal pain at [facility] and was told that he required cholecystectomy which was postponed until after his aortic valve was treated.  Past Medical History:  Diagnosis Date   Abdominopelvic abscess (HCC) 09/29/2018   Abscess of appendix 09/22/2018   Acquired spondylolisthesis  of lumbosacral region 03/20/2020   Anemia of chronic disease 08/18/2019   Aortic atherosclerosis (HCC)    Aortic stenosis    a.) TTE 09/15/2018: mild-mod (MPG 19); b.) TTE 03/21/2021: mild-mod (MPG 14.3); c.) TTE 04/24/2022: mild- mod (MPG 15)   Appendicitis 09/08/2018   Asthma    Benign hypertension with CKD (chronic kidney disease) stage IV (HCC) 02/24/2018   Benign prostatic hyperplasia without lower urinary tract symptoms 01/14/2018   Bilateral lower extremity edema 11/04/2018   Bradycardia 03/25/2018   Bruit of right carotid artery 07/26/2015   CAD (coronary artery disease) 2003   a.) s/p 4v CABG 2003   Cardiac murmur 07/26/2015   Chronic combined systolic and diastolic CHF (congestive heart failure) (HCC) 07/26/2015   a.) TTE 09/15/2018: EF 50-55%, mod LVH, RVE, BAE, mild-mod TR, AoV sclerosis, G1DD; b.) MPI 10/27/2019: EF <30%; c.) TTE 03/21/2021: EF 35-40%, post AK, inf HK, mod LVH, mod red RVSF, mod LAE, mod Aov sclerosis, G2DD; c.) TTE 04/24/2022: EF 35-40%, post AK, glok HK, mod LVE, mod red RVSF, mild-mod MR, Aov sclerosis, G3DD   Chronic idiopathic constipation 11/02/2018   Chronic low back pain without sciatica 12/02/2018   Chronic pain of right hip 03/31/2020   CKD (chronic kidney disease) stage 4, GFR 15-29 ml/min (HCC) 10/07/2018   Coronary artery disease    Dysphagia 07/26/2015   Erectile dysfunction 07/14/2017   Essential hypertension 02/24/2018   Foot ulcer, left (HCC)    GERD (gastroesophageal reflux disease)    Hematuria 07/27/2015   History of marijuana use    Hyperlipidemia 07/26/2015  Hypertension    Hypothyroidism 11/14/2015   Insomnia 11/02/2018   Lumbar spondylosis 03/20/2020   Malaise and fatigue 03/25/2018   Myocardial infarction due to demand ischemia (HCC) 09/26/2020   a.) Type II NSTEMI; b.) troponins were trended 0.54 --> 0.56 --> 0.52 ng/mL   Non-compliance 05/05/2019   Osseous and subluxation stenosis of intervertebral foramina of lumbar  region 03/20/2020   Osteoarthritis 10/06/2019   Peripheral vascular disease (HCC)    Personal history of tobacco use, presenting hazards to health 09/27/2020   Polyneuropathy in diabetes (HCC) 05/07/2019   Pulmonary HTN (HCC)    a.) TTE 05/12/2019: PASP 71; b.) TTE 09/27/2020: PASP >70; c.) TTE 03/21/2021: RVSP 43; d.) TTE 04/24/2022: RVSP 80.6   Puncture wound of right hip 04/02/2018   PVD (peripheral vascular disease) with claudication (HCC)    a.) s/p PTA 03/02/2018 - balloon angioplasty LEFT below knee popliteal artery; b.) s/p PTA 09/30/2022: baloon angioplasty LEFT tibioperoneal trunck, most proximal peroneal artery, and LEFT popliteal artery.   S/P CABG x 4 2003   Sleep apnea 07/26/2015   a.) does not require nocturnal PAP therapy   Subacute osteomyelitis of left foot (HCC) 03/25/2018   Thrombocytopenia (HCC)    Type 2 diabetes mellitus with stage 4 chronic kidney disease, with long-term current use of insulin  (HCC) 07/26/2015   Vitamin B12 deficiency 06/16/2019   Vitamin D deficiency 05/07/2018    Past Surgical History:  Procedure Laterality Date   AMPUTATION TOE Left 03/13/2018   Procedure: AMPUTATION TOE-MPJ;  Surgeon: Anell Baptist, DPM;  Location: [facility] ORS;  Service: Podiatry;  Laterality: Left;   ANGIOPLASTY     AV FISTULA PLACEMENT Left 08/20/2023   Procedure: ARTERIOVENOUS (AV) FISTULA CREATION;  Surgeon: Young Hensen, MD;  Location: [facility] OR;  Service: Vascular;  Laterality: Left;   CARDIAC CATHETERIZATION     CORONARY ARTERY BYPASS GRAFT N/A 2003   IR FLUORO GUIDE CV LINE RIGHT  08/18/2023   IR US  GUIDE VASC ACCESS RIGHT  08/18/2023   LAPAROSCOPIC APPENDECTOMY N/A 09/08/2018   Procedure: APPENDECTOMY LAPAROSCOPIC;  Surgeon: Alben Alma, MD;  Location: [facility] ORS;  Service: General;  Laterality: N/A;   LOWER EXTREMITY ANGIOGRAPHY Left 03/02/2018   Procedure: LOWER EXTREMITY ANGIOGRAPHY;  Surgeon: Celso College, MD;  Location: [facility] INVASIVE CV LAB;  Service:  Cardiovascular;  Laterality: Left;   LOWER EXTREMITY ANGIOGRAPHY Left 09/30/2022   Procedure: Lower Extremity Angiography;  Surgeon: Celso College, MD;  Location: [facility] INVASIVE CV LAB;  Service: Cardiovascular;  Laterality: Left;   RIGHT/LEFT HEART CATH AND CORONARY/GRAFT ANGIOGRAPHY N/A 08/01/2023   Procedure: RIGHT/LEFT HEART CATH AND CORONARY/GRAFT ANGIOGRAPHY;  Surgeon: Kyra Phy, MD;  Location: [facility] INVASIVE CV LAB;  Service: Cardiovascular;  Laterality: N/A;   ROTATOR CUFF REPAIR Left     Family History  Problem Relation Age of Onset   Hyperlipidemia Mother    Hypertension Mother    Diabetes Mother    Heart attack Brother    Heart disease Brother    Lung disease Father     Social History   Socioeconomic History   Marital status: Married    Spouse name: Not on file   Number of children: Not on file   Years of education: Not on file   Highest education level: Not on file  Occupational History   Not on file  Tobacco Use   Smoking status: Former    Current packs/day: 0.00    Average packs/day: 0.5 packs/day for 25.0 years (  12.5 ttl pk-yrs)    Types: Cigarettes    Start date: 58    Quit date: 2003    Years since quitting: 22.4   Smokeless tobacco: Never  Vaping Use   Vaping status: Never Used  Substance and Sexual Activity   Alcohol use: Not Currently    Comment: 2003   Drug use: Not Currently    Types: Marijuana   Sexual activity: Not on file  Other Topics Concern   Not on file  Social History Narrative   Not on file   Social Drivers of Health   Financial Resource Strain: Low Risk  (09/26/2020)   Received from [facility], [facility]   Overall Financial Resource Strain (CARDIA)    Difficulty of Paying Living Expenses: Not hard at all  Food Insecurity: Low Risk  (09/12/2023)   Received from [facility]   Hunger Vital Sign    Worried About Running Out of Food in the Last Year: Never true    Ran Out of Food in the Last Year: Never true   Transportation Needs: No Transportation Needs (09/12/2023)   Received from [facility]    In the past 12 months, has lack of reliable transportation kept you from medical appointments, meetings, work or from getting things needed for daily living? : No  Physical Activity: Inactive (09/26/2020)   Received from [facility], [facility]   Exercise Vital Sign    Days of Exercise per Week: 0 days    Minutes of Exercise per Session: 0 min  Stress: No Stress Concern Present (09/26/2020)   Received from [facility], [facility] of Occupational Health - Occupational Stress Questionnaire    Feeling of Stress : Not at all  Social Connections: Moderately Integrated (08/12/2023)   Social Connection and Isolation Panel [NHANES]    Frequency of Communication with Friends and Family: More than three times a week    Frequency of Social Gatherings with Friends and Family: Once a week    Attends Religious Services: More than 4 times per year    Active Member of [employer] or Organizations: No    Attends Banker Meetings: Never    Marital Status: Married  Catering manager Violence: Not At Risk (08/12/2023)   Humiliation, Afraid, Rape, and Kick questionnaire    Fear of Current or Ex-Partner: No    Emotionally Abused: No    Physically Abused: No    Sexually Abused: No    Prior to Admission medications   Medication Sig Start Date End Date Taking? Authorizing Provider  acetaminophen  (TYLENOL ) 500 MG tablet Take 1,000 mg by mouth every 6 (six) hours as needed for moderate pain or headache.    Yes [provider]  aspirin  EC 81 MG tablet Take 1 tablet (81 mg total) by mouth daily. Swallow whole. 07/25/23  Yes Thukkani, Arun K, MD  atorvastatin  (LIPITOR) 20 MG tablet Take 1 tablet (20 mg total) by mouth daily. 07/25/23  Yes Thukkani, Arun K, MD  Blood Glucose Monitoring Suppl (TGT BLOOD GLUCOSE MONITORING) w/Device KIT 3 (three) times daily.  09/20/21  Yes [provider]  clopidogrel  (PLAVIX ) 75 MG tablet Take 1 tablet (75 mg total) by mouth daily. 11/20/20  Yes Revankar, Micael Adas, MD  cyclobenzaprine  (FLEXERIL ) 10 MG tablet Take 10 mg by mouth 3 (three) times daily as needed for muscle spasms.   Yes [provider]  gabapentin  (NEURONTIN ) 300 MG  capsule Take 1 capsule (300 mg total) by mouth at bedtime. 08/25/23  Yes Arrien, Mauricio Daniel, MD  Insulin  Glargine (BASAGLAR  [facility]) 100 UNIT/ML Inject 7 Units into the skin at bedtime. 07/18/23 07/17/24 Yes [provider]  isosorbide  mononitrate (IMDUR ) 120 MG 24 hr tablet Take 1 tablet (120 mg total) by mouth daily. 08/27/23  Yes Revankar, Micael Adas, MD  Lancets MISC 1 each by miscellaneous route 2 (two) times a day. 09/20/21  Yes [provider]  latanoprost  (XALATAN ) 0.005 % ophthalmic solution Place 1 drop into both eyes at bedtime. 04/15/23  Yes [provider]  levothyroxine  (SYNTHROID ) 50 MCG tablet Take 50 mcg by mouth daily. 04/05/23  Yes [provider]  nitroGLYCERIN  (NITROSTAT ) 0.4 MG SL tablet Place 1 tablet (0.4 mg total) under the tongue every 5 (five) minutes as needed for chest pain. 11/20/20  Yes Revankar, Micael Adas, MD  tamsulosin  (FLOMAX ) 0.4 MG CAPS capsule Take 0.4 mg by mouth daily after breakfast. 04/19/20  Yes [provider]  vitamin B-12 (CYANOCOBALAMIN ) 500 MCG tablet Take 500 mcg by mouth daily.    Yes [provider]  Vitamin D, Ergocalciferol, (DRISDOL) 1.25 MG (50000 UNIT) CAPS capsule Take 50,000 Units by mouth once a week. Mondays 07/13/21  Yes [provider]  carvedilol  (COREG ) 3.125 MG tablet Take 1 tablet (3.125 mg total) by mouth 2 (two) times daily. 05/20/23 09/22/23  Terrance Ferretti, NP    Current Outpatient Medications  Medication Sig Dispense Refill   acetaminophen  (TYLENOL ) 500 MG tablet Take 1,000 mg by mouth every 6 (six) hours as needed for moderate pain or headache.       aspirin  EC 81 MG tablet Take 1 tablet (81 mg total) by mouth daily. Swallow whole.     atorvastatin  (LIPITOR) 20 MG tablet Take 1 tablet (20 mg total) by mouth daily. 90 tablet 3   Blood Glucose Monitoring Suppl (TGT BLOOD GLUCOSE MONITORING) w/Device KIT 3 (three) times daily.     clopidogrel  (PLAVIX ) 75 MG tablet Take 1 tablet (75 mg total) by mouth daily. 90 tablet 3   cyclobenzaprine  (FLEXERIL ) 10 MG tablet Take 10 mg by mouth 3 (three) times daily as needed for muscle spasms.     gabapentin  (NEURONTIN ) 300 MG capsule Take 1 capsule (300 mg total) by mouth at bedtime. 30 capsule 0   Insulin  Glargine (BASAGLAR  KWIKPEN) 100 UNIT/ML Inject 7 Units into the skin at bedtime.     isosorbide  mononitrate (IMDUR ) 120 MG 24 hr tablet Take 1 tablet (120 mg total) by mouth daily. 90 tablet 3   Lancets MISC 1 each by miscellaneous route 2 (two) times a day.     latanoprost  (XALATAN ) 0.005 % ophthalmic solution Place 1 drop into both eyes at bedtime.     levothyroxine  (SYNTHROID ) 50 MCG tablet Take 50 mcg by mouth daily.     nitroGLYCERIN  (NITROSTAT ) 0.4 MG SL tablet Place 1 tablet (0.4 mg total) under the tongue every 5 (five) minutes as needed for chest pain. 25 tablet 6   tamsulosin  (FLOMAX ) 0.4 MG CAPS capsule Take 0.4 mg by mouth daily after breakfast.     vitamin B-12 (CYANOCOBALAMIN ) 500 MCG tablet Take 500 mcg by mouth daily.      Vitamin D, Ergocalciferol, (DRISDOL) 1.25 MG (50000 UNIT) CAPS capsule Take 50,000 Units by mouth once a week. Mondays     carvedilol  (COREG ) 3.125 MG tablet Take 1 tablet (3.125 mg total) by mouth 2 (two) times daily. 180 tablet  3   No current facility-administered medications for this visit.    No Known Allergies    Review of Systems:   General:  Normal appetite, + decreased energy, no weight gain, no weight loss, no fever  Cardiac:  no chest pain with exertion, no chest pain at rest, +SOB with moderate exertion, no resting SOB, no PND, no orthopnea, no  palpitations, no arrhythmia, no atrial fibrillation, + LE edema, no dizzy spells, no syncope  Respiratory:  + exertional shortness of breath, no home oxygen, no productive cough, no dry cough, no bronchitis, no wheezing, no hemoptysis, no asthma, no pain with inspiration or cough, + sleep apnea, no CPAP at night  GI:   no difficulty swallowing, no reflux, no frequent heartburn, no hiatal hernia, no abdominal pain, no constipation, no diarrhea, no hematochezia, no hematemesis, no melena  GU:   no dysuria,  no frequency, no urinary tract infection, no hematuria, no enlarged prostate, no kidney stones, + kidney disease  Vascular:  no pain suggestive of claudication, no pain in feet, no leg cramps, no varicose veins, no DVT, no non-healing foot ulcer  Neuro:   no stroke, no TIA's, no seizures, no headaches, no temporary blindness one eye,  no slurred speech, no peripheral neuropathy, no chronic pain, no instability of gait, no memory/cognitive dysfunction  Musculoskeletal: no arthritis, no joint swelling, no myalgias, no difficulty walking, normal mobility   Skin:   no rash, no itching, no skin infections, no pressure sores or ulcerations  Psych:   no anxiety, no depression, no nervousness, no unusual recent stress  Eyes:   no blurry vision, no floaters, no recent vision changes, + wears glasses   ENT:   no hearing loss, no loose or painful teeth, + dentures  Hematologic:  no easy bruising, no abnormal bleeding, no clotting disorder, no frequent epistaxis  Endocrine:  +diabetes, does  check CBG's at home     Physical Exam:   BP 107/61   Pulse 72   Resp 20   Ht 6' (1.829 m)   Wt 186 lb (84.4 kg)   SpO2 98% Comment: RA  BMI 25.23 kg/m   General:  well-appearing  HEENT:  Unremarkable, NCAT, PERLA, EOMI  Neck:   no JVD, no bruits, no adenopathy    Chest:   clear to auscultation, symmetrical breath sounds, no wheezes, no rhonchi, right internal jugular HD catheter exiting right chest  anteriorly.  CV:   RRR, 3/6 systolic murmur RSB, no diastolic murmur  Abdomen:  soft, non-tender, no masses   Extremities:  warm, well-perfused, pedal pulses palpable, mild bilateral lower extremity edema, left upper arm fistula.  Rectal/GU  Deferred  Neuro:   Grossly non-focal and symmetrical throughout  Skin:   Clean and dry, no rashes, no breakdown  Diagnostic Tests:  Physicians  Panel Physicians Referring Physician Case Authorizing Physician  Kyra Phy, MD (Primary)     Procedures  RIGHT/LEFT HEART CATH AND CORONARY/GRAFT ANGIOGRAPHY   Conclusion      Origin to Prox Graft lesion is 100% stenosed.   Origin to Prox Graft lesion is 100% stenosed.   Origin to Prox Graft lesion is 100% stenosed.   Ost LAD to Prox LAD lesion is 100% stenosed.   Ost RCA to Prox RCA lesion is 100% stenosed.   Mid Cx lesion is 99% stenosed.   1.  Severe native vessel disease. 2.  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  These presumably go to  the diagonal, PDA, and obtuse marginal systems. 3.  Widely patent LIMA to LAD. 4.  Fick cardiac output of 6.9 L/min and Fick cardiac index of 3.0 L/min/m with the following hemodynamics:            Right atrial pressure mean of 16 mmHg            Right ventricular pressure 74/-1 with an end-diastolic pressure of 18 mmHg            PA pressure of 66/21 with a mean of 39 mmHg            Wedge pressure mean of 28 mmHg with V waves to 33 mmHg            PVR 1.6 Wood units            PA pulsatility index of 2.5 5.  Total dye expenditure of 40 cc   Recommendation: Continue evaluation for potential aortic valve intervention.   Surgeon Notes    08/20/2023 11:43 AM Op Note signed by Young Hensen, MD   Indications  Nonrheumatic aortic valve stenosis [I35.0 (ICD-10-CM)]   Procedural Details  Technical Details The patient is a 68 year old male with a history of severe symptomatic aortic stenosis, coronary artery disease status post  four-vessel CABG of unknown anatomy but left radial has been harvested, ischemic cardiomyopathy with ejection fraction of 30 to 35%, hypertension, hyperlipidemia, type 2 diabetes, peripheral vascular disease, and stage IV chronic kidney disease who was seen in the outpatient setting due to lifestyle limiting dyspnea.  He is referred for right heart catheterization and coronary and bypass angiography study as part of an evaluation for potential aortic valve intervention.  After obtaining consent, the patient brought to the cardiac catheterization laboratory and prepped draped sterile fashion.  Xylocaine  was used to anesthetize the right groin and ultrasound was used to gain access to the right common femoral artery.  A 6 French sheath was placed.  Ultrasound was used to gain access to the right femoral vein and a 7 French sheath was placed.    6 French diagnostic catheters were used for coronary and bypass angiography including a LIMA catheter.  6 [place] JR4 was used for left ventricular end-diastolic pressure assessment.  A 7 [place] balloontipped catheter was used for right heart catheterization.  After review of the angiographic images and hemodynamic data, no further interventions were pursued.  A Perclose was used to manage of the right common femoral arteriotomy and the venous sheath was removed with manual pressure applied.  There were no acute complications. Estimated blood loss <50 mL.   During this procedure medications were administered to achieve and maintain moderate conscious sedation while the patient's heart rate, blood pressure, and oxygen saturation were continuously monitored and I was present face-to-face 100% of this time. [place] Rad Tech  Charlene Winston Rad Tech and [employer] RN are independent, trained observers who assisted in the monitoring of the patient's level of consciousness.   Medications (Filter: Administrations occurring from 1057 to 1219 on 08/01/23)   important  Continuous medications are totaled by the amount administered until 08/01/23 1219.   fentaNYL  (SUBLIMAZE ) injection (mcg)  Total dose: 25 mcg Date/Time Rate/Dose/Volume Action   08/01/23 1117 25 mcg Given   midazolam  (VERSED ) injection (mg)  Total dose: 1 mg Date/Time Rate/Dose/Volume Action   08/01/23 1118 1 mg Given   Heparin  (Porcine) in NaCl 1000-0.9 UT/500ML-% SOLN (mL)  Total volume: 1,000 mL Date/Time Rate/Dose/Volume Action  08/01/23 1118 1,000 mL Given   lidocaine  (PF) (XYLOCAINE ) 1 % injection (mL)  Total volume: 5 mL Date/Time Rate/Dose/Volume Action   08/01/23 1125 5 mL Given   iohexol  (OMNIPAQUE ) 350 MG/ML injection (mL)  Total volume: 40 mL Date/Time Rate/Dose/Volume Action   08/01/23 1204 40 mL Given    Sedation Time  Sedation Time Physician-1: 49 minutes 37 seconds Contrast     Administrations occurring from 1057 to 1219 on 08/01/23:  Medication Name Total Dose  iohexol  (OMNIPAQUE ) 350 MG/ML injection 40 mL   Radiation/Fluoro  Fluoro time: 11.3 (min) DAP: 13.8 (Gycm2) Cumulative Air Kerma: 187.2 (mGy) Complications  Complications documented before study signed (08/01/2023 12:20 PM)   No complications were associated with this study.  Documented by Polo Butter, RN - 08/01/2023 12:08 PM     Coronary Findings  Diagnostic Dominance: Right Left Anterior Descending  Ost LAD to Prox LAD lesion is 100% stenosed.    Left Circumflex  Mid Cx lesion is 99% stenosed.    Right Coronary Artery  Ost RCA to Prox RCA lesion is 100% stenosed.    Right Posterior Descending Artery  Collaterals  RPDA filled by collaterals from Dist LAD.      First Right Posterolateral Branch    Second Right Posterolateral Branch  Collaterals  2nd RPL filled by collaterals from 2nd Sept.      Third Right Posterolateral Branch  Collaterals  3rd RPL filled by collaterals from 3rd Mrg.      Graft To 1st Diag  Origin to Prox Graft lesion is 100% stenosed.     Graft To 2nd Mrg  Origin to Prox Graft lesion is 100% stenosed.    Graft To RPDA  Origin to Prox Graft lesion is 100% stenosed.    LIMA Graft To Mid LAD    Intervention   No interventions have been documented.   Coronary Diagrams  Diagnostic Dominance: Right  Intervention   Implants   No implant documentation for this case.   Syngo Images   Show images for CARDIAC CATHETERIZATION Images on Long Term Storage   Show images for Lonnie, Reth to Procedure Log  Procedure Log    Hemo Data  Flowsheet Row Most Recent Value  Fick Cardiac Output 6.88 L/min  Fick Cardiac Output Index 3 (L/min)/BSA  RA A Wave 22 mmHg  RA V Wave 21 mmHg  RA Mean 18 mmHg  RV Systolic Pressure 74 mmHg  RV Diastolic Pressure -1 mmHg  RV EDP 18 mmHg  PA Systolic Pressure 66 mmHg  PA Diastolic Pressure 21 mmHg  PA Mean 39 mmHg  PW A Wave 25 mmHg  PW V Wave 33 mmHg  PW Mean 28 mmHg  AO Systolic Pressure 116 mmHg  AO Diastolic Pressure 54 mmHg  AO Mean 76 mmHg  QP/QS 0.86  TPVR Index 15.05 HRUI  TSVR Index 25.37 HRUI  PVR SVR Ratio 0.22  TPVR/TSVR Ratio 0.59    ADDENDUM REPORT: 10/19/2023 04:01   EXAM: OVER-READ INTERPRETATION  CT CHEST   The following report is an over-read performed by radiologist Dr. Violeta Grey of [facility] on 10/19/2023. This over-read does not include interpretation of cardiac or coronary anatomy or pathology. The coronary calcium  score/coronary CTA interpretation by the cardiologist is attached.   COMPARISON:  None.   FINDINGS: Cardiovascular: Atherosclerotic calcifications of the aorta are noted. Changes of prior coronary bypass grafting are seen. Right jugular dialysis catheter is noted.   Mediastinum/Nodes: There are no enlarged  lymph nodes within the visualized mediastinum.   Lungs/Pleura: There is no pleural effusion. Lungs are well aerated bilaterally. Scattered small less than 5 mm nodules are noted throughout  both lungs   Upper abdomen: No significant findings in the visualized upper abdomen.   Musculoskeletal/Chest wall: No chest wall mass or suspicious osseous findings within the visualized chest.   IMPRESSION: Aortic Atherosclerosis (ICD10-I70.0).   Scattered nodules bilaterally measuring less than 5 mm. These are stable from exam in 2022. No further follow-up is recommended.     Electronically Signed   By: Violeta Grey M.D.   On: 10/19/2023 04:01    Addended by Violeta Grey, MD on 10/19/2023  4:04 AM    Study Result  Narrative & Impression  MEDICATIONS: None   EXAM: Cardiac TAVR CT   TECHNIQUE: The patient was scanned on a GE apex scanner. 1 beat acquisition triggered in the descending thoracic aorta at 110 HU's. A non contrast, gated CT scan was obtained first with axial slices of 2.5 mm through the heart for valve scoring. A 120 kV retrospective, gated, contrast scan done with gantry rotation speed of 230 msec and collimation 0.63 mm. A delayed scan was obtained to exclude LAA thrombus. The 3D data set was reconstructed in 5% intervals of the R-R cycle. Best systolic phase was motion corrected Images were analyzed on a dedicated workstation using MPR, MIP and VRT modes The patient received 100 cc of contrast   FINDINGS: Aortic Valve: Severely calcified tri leaflet AV with score 2670   Aorta: No aneurysm mild calcific atherosclerosis   Sinotubular Junction: 32 mm   Ascending Thoracic Aorta: 35.6 mm   Aortic Arch: 29 mm   Descending Thoracic Aorta: 27 mm   Sinus of Valsalva Measurements:   Non-coronary: 38.6 mm   height 23.5 mm   Right - coronary: 33.9 mm  height 23.2 mm   Left - coronary: 36.9 mm   height 24.8 mm   Coronary Artery Height above Annulus:   Left Main: 19.5 mm above annulus   Right Coronary: 18.5 mm above annulus   Virtual Basal Annulus Measurements:   Maximum/Minimum Diameter: 30.5 mm x 24.6 mm   Perimeter: 88.6 mm   Area: 591  mm2   Coronary Arteries: Sufficient height above annulus for deployment   There are 3 occluded SVG;s in the ascending thoracic aorta   The LIMA is patent to the LAD   Optimum Fluoroscopic Angle for Delivery: RAO 3 Caudal 3 degrees   Membranous septal length 8.1 mm   IMPRESSION: 1. Calcified tri leaflet AV with annulus 591 mm2 suitable for a 29 mm Sapien valve. Alternatively a 34 mm Medtronic valve can be considered   2.  Coronary arteries sufficient height above annulus for deployment   3.  Occluded SVG;s.  Patient LIMA to LAD   4.  Membranous septal length 8.1 mm   5.  Optimum angiographic angle for deployment RAO 3 Caudal 3 degrees   6.  AV calcium  score 2670   Janelle Mediate   Electronically Signed: By: Janelle Mediate M.D. On: 10/10/2023 12:22      Narrative & Impression  EXAMINATION: CT ANGIO ABDOMEN PELVIS W & WO CONTRAST (accession [id number] [facility]), CT ANGIO CHEST AORTA W & OR WO CONTRAST (accession [id number] [facility])   CLINICAL INDICATION: Male, 68 years old. Aortic valve replacement (TAVR), pre-op  eval   TECHNIQUE: Axial CTA of the chest, abdomen and pelvis with and without 100 cc Omnipaque  300 intravenous contrast. Multiplanar and 3D  reformations provided. Unless otherwise specified, incidental thyroid , adrenal, renal lesions do not require dedicated imaging follow up. Additionally, any mentioned pulmonary nodules do not require dedicated imaging follow-up based on the Fleischner guidelines unless otherwise specified. Coronary calcifications are not identified unless otherwise specified.   COMPARISON:   FINDINGS:   The thoracic aorta is nonaneurysmal. Scattered atherosclerotic changes are present. The origins of the great vessels appear patent.   Tunneled right chest wall hemodialysis catheter tip terminates in the right atrium. The visualized thyroid  is normal. The main pulmonary artery is dilated. The heart is enlarged. There are coronary calcifications  with post-CABG changes. There is no free fluid. There are prominent mediastinal lymph nodes which are favored to be reactive. The trachea and mainstem bronchi appear patent. The lungs are clear.   The abdominal aorta is normal in caliber. Scattered atherosclerotic changes are present. The celiac artery demonstrates greater than 50% stenosis. The SMA is patent. The renal arteries are patent. The IMA is patent. The common iliac arteries are patent with a focal nonflow-limiting dissection of the right common iliac artery.   The internal and external iliac arteries are patent. The common femoral arteries and visualized portions of the superficial and deep femoral arteries are patent.   The liver appears normal. There is cholelithiasis. The spleen is borderline enlarged. The pancreas is normal. The adrenals are normal. Kidneys are normal. The urinary bladder is normal. The prostate is enlarged. Small fat-containing bilateral inguinal hernias are noted. Large and small bowel loops are otherwise within normal limits. No free fluid or adenopathy.   No acute osseous enteritis. There are degenerative changes of the spine and bony pelvis.   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   Otherwise this is a preprocedural CTA with various additional findings as above.   DOSE REDUCTION: This exam was performed according to our departmental dose-optimization program which includes automated exposure control, adjustment of the mA and/or kV according to patient size and/or use of iterative reconstruction technique.   Electronically signed by: [place] Engel MD 10/10/2023 02:38 PM EDT RP Workstation: [id number]    Impression:  This 68 year old gentleman has stage D, severe, symptomatic low-flow/low gradient aortic stenosis with NYHA class II symptoms of exertional fatigue and shortness of breath consistent with chronic systolic and diastolic congestive heart  failure.  His echocardiogram was done at an outside institution in January 2025 and showed a mean gradient of 28 mmHg with a valve area of 0.9 cm and a low stroke-volume index of 23.  Left ventricular ejection fraction was 30 to 35%.  Cardiac catheterization showed severe native three-vessel disease with all 3 vein grafts occluded.  There was a patent LIMA to the LAD with collaterals to the distal right coronary artery.  There was moderate pulmonary hypertension at 66/21 with a mean of 39.  I agree that aortic valve replacement is indicated this patient for relief of his symptoms and to prevent further left ventricular dysfunction and inability to tolerate hemodialysis.  Given his comorbidities and prior coronary bypass surgery I think the best option for treating him would be transcatheter aortic valve replacement.  His gated cardiac CTA shows anatomy suitable for TAVR using a 34 mm Medtronic Evolut FX valve.  His abdominal and pelvic CTA shows adequate pelvic vasculature to allow transfemoral insertion preferably on the left due to a right iliac artery dissection.  The patient and his wife were counseled at length regarding treatment alternatives for management of  severe symptomatic aortic stenosis. The risks and benefits of surgical intervention has been discussed in detail. Long-term prognosis with medical therapy was discussed. Alternative approaches such as conventional surgical aortic valve replacement, transcatheter aortic valve replacement, and palliative medical therapy were compared and contrasted at length. This discussion was placed in the context of the patient's own specific clinical presentation and past medical history. All of their questions have been addressed.   Following the decision to proceed with transcatheter aortic valve replacement, a discussion was held regarding what types of management strategies would be attempted intraoperatively in the event of life-threatening complications,  including whether or not the patient would be considered a candidate for the use of cardiopulmonary bypass and/or conversion to open sternotomy for attempted surgical intervention.  Given his comorbidities and prior coronary bypass surgery I do not think he is a candidate for emergent sternotomy or cardiopulmonary bypass to manage any intraoperative complications.  The patient has been advised of a variety of complications that might develop including but not limited to risks of death, stroke, paravalvular leak, aortic dissection or other major vascular complications, aortic annulus rupture, device embolization, cardiac rupture or perforation, mitral regurgitation, acute myocardial infarction, arrhythmia, heart block or bradycardia requiring permanent pacemaker placement, congestive heart failure, respiratory failure, renal failure, pneumonia, infection, other late complications related to structural valve deterioration or migration, or other complications that might ultimately cause a temporary or permanent loss of functional independence or other long term morbidity. The patient provides full informed consent for the procedure as described and all questions were answered.       Plan:  He will tentatively be scheduled for transfemoral TAVR using a Medtronic valve on 11/18/2023.  I spent 60 minutes performing this consultation and > 50% of this time was spent face to face counseling and coordinating the care of this patient's severe symptomatic aortic stenosis.   Bartley Lightning, MD 10/29/2023

## 2023-11-04 ENCOUNTER — Other Ambulatory Visit: Payer: Self-pay

## 2023-11-04 DIAGNOSIS — I350 Nonrheumatic aortic (valve) stenosis: Secondary | ICD-10-CM

## 2023-11-06 ENCOUNTER — Ambulatory Visit: Payer: PPO | Admitting: Cardiology

## 2023-11-11 ENCOUNTER — Inpatient Hospital Stay (HOSPITAL_COMMUNITY)
Admission: EM | Admit: 2023-11-11 | Discharge: 2023-11-14 | DRG: 871 | Disposition: A | Discharge status: Home Health | Attending: Internal Medicine | Admitting: Internal Medicine

## 2023-11-11 ENCOUNTER — Emergency Department (HOSPITAL_COMMUNITY)

## 2023-11-11 ENCOUNTER — Other Ambulatory Visit: Payer: Self-pay

## 2023-11-11 ENCOUNTER — Encounter (HOSPITAL_COMMUNITY): Payer: Self-pay

## 2023-11-11 DIAGNOSIS — E1151 Type 2 diabetes mellitus with diabetic peripheral angiopathy without gangrene: Secondary | ICD-10-CM | POA: Diagnosis present

## 2023-11-11 DIAGNOSIS — R509 Fever, unspecified: Principal | ICD-10-CM

## 2023-11-11 DIAGNOSIS — R7401 Elevation of levels of liver transaminase levels: Secondary | ICD-10-CM | POA: Diagnosis present

## 2023-11-11 DIAGNOSIS — E1159 Type 2 diabetes mellitus with other circulatory complications: Secondary | ICD-10-CM | POA: Diagnosis present

## 2023-11-11 DIAGNOSIS — Z83438 Family history of other disorder of lipoprotein metabolism and other lipidemia: Secondary | ICD-10-CM

## 2023-11-11 DIAGNOSIS — I252 Old myocardial infarction: Secondary | ICD-10-CM

## 2023-11-11 DIAGNOSIS — Z79890 Hormone replacement therapy (postmenopausal): Secondary | ICD-10-CM

## 2023-11-11 DIAGNOSIS — K8050 Calculus of bile duct without cholangitis or cholecystitis without obstruction: Secondary | ICD-10-CM | POA: Diagnosis present

## 2023-11-11 DIAGNOSIS — E559 Vitamin D deficiency, unspecified: Secondary | ICD-10-CM | POA: Diagnosis present

## 2023-11-11 DIAGNOSIS — Z951 Presence of aortocoronary bypass graft: Secondary | ICD-10-CM

## 2023-11-11 DIAGNOSIS — K8020 Calculus of gallbladder without cholecystitis without obstruction: Secondary | ICD-10-CM

## 2023-11-11 DIAGNOSIS — A419 Sepsis, unspecified organism: Principal | ICD-10-CM | POA: Insufficient documentation

## 2023-11-11 DIAGNOSIS — Z992 Dependence on renal dialysis: Secondary | ICD-10-CM

## 2023-11-11 DIAGNOSIS — I700 Atherosclerosis of aorta: Secondary | ICD-10-CM | POA: Diagnosis present

## 2023-11-11 DIAGNOSIS — I5042 Chronic combined systolic (congestive) and diastolic (congestive) heart failure: Secondary | ICD-10-CM | POA: Diagnosis present

## 2023-11-11 DIAGNOSIS — Z79899 Other long term (current) drug therapy: Secondary | ICD-10-CM

## 2023-11-11 DIAGNOSIS — E1122 Type 2 diabetes mellitus with diabetic chronic kidney disease: Secondary | ICD-10-CM | POA: Diagnosis present

## 2023-11-11 DIAGNOSIS — Z87891 Personal history of nicotine dependence: Secondary | ICD-10-CM

## 2023-11-11 DIAGNOSIS — E785 Hyperlipidemia, unspecified: Secondary | ICD-10-CM | POA: Diagnosis present

## 2023-11-11 DIAGNOSIS — Z1152 Encounter for screening for COVID-19: Secondary | ICD-10-CM

## 2023-11-11 DIAGNOSIS — Z7982 Long term (current) use of aspirin: Secondary | ICD-10-CM

## 2023-11-11 DIAGNOSIS — I2510 Atherosclerotic heart disease of native coronary artery without angina pectoris: Secondary | ICD-10-CM | POA: Diagnosis present

## 2023-11-11 DIAGNOSIS — E1149 Type 2 diabetes mellitus with other diabetic neurological complication: Secondary | ICD-10-CM | POA: Diagnosis present

## 2023-11-11 DIAGNOSIS — D6959 Other secondary thrombocytopenia: Secondary | ICD-10-CM | POA: Diagnosis present

## 2023-11-11 DIAGNOSIS — N400 Enlarged prostate without lower urinary tract symptoms: Secondary | ICD-10-CM | POA: Diagnosis present

## 2023-11-11 DIAGNOSIS — I350 Nonrheumatic aortic (valve) stenosis: Secondary | ICD-10-CM

## 2023-11-11 DIAGNOSIS — E538 Deficiency of other specified B group vitamins: Secondary | ICD-10-CM | POA: Diagnosis present

## 2023-11-11 DIAGNOSIS — E872 Acidosis, unspecified: Secondary | ICD-10-CM | POA: Diagnosis present

## 2023-11-11 DIAGNOSIS — E039 Hypothyroidism, unspecified: Secondary | ICD-10-CM | POA: Diagnosis present

## 2023-11-11 DIAGNOSIS — J45909 Unspecified asthma, uncomplicated: Secondary | ICD-10-CM | POA: Diagnosis present

## 2023-11-11 DIAGNOSIS — K219 Gastro-esophageal reflux disease without esophagitis: Secondary | ICD-10-CM | POA: Diagnosis present

## 2023-11-11 DIAGNOSIS — D631 Anemia in chronic kidney disease: Secondary | ICD-10-CM | POA: Diagnosis present

## 2023-11-11 DIAGNOSIS — Z8249 Family history of ischemic heart disease and other diseases of the circulatory system: Secondary | ICD-10-CM

## 2023-11-11 DIAGNOSIS — Z794 Long term (current) use of insulin: Secondary | ICD-10-CM

## 2023-11-11 DIAGNOSIS — E1142 Type 2 diabetes mellitus with diabetic polyneuropathy: Secondary | ICD-10-CM | POA: Diagnosis present

## 2023-11-11 DIAGNOSIS — Z89422 Acquired absence of other left toe(s): Secondary | ICD-10-CM

## 2023-11-11 DIAGNOSIS — N186 End stage renal disease: Secondary | ICD-10-CM | POA: Diagnosis present

## 2023-11-11 DIAGNOSIS — I25810 Atherosclerosis of coronary artery bypass graft(s) without angina pectoris: Secondary | ICD-10-CM | POA: Diagnosis present

## 2023-11-11 DIAGNOSIS — K810 Acute cholecystitis: Secondary | ICD-10-CM

## 2023-11-11 DIAGNOSIS — R1011 Right upper quadrant pain: Secondary | ICD-10-CM | POA: Diagnosis not present

## 2023-11-11 DIAGNOSIS — I152 Hypertension secondary to endocrine disorders: Secondary | ICD-10-CM | POA: Diagnosis present

## 2023-11-11 DIAGNOSIS — Z833 Family history of diabetes mellitus: Secondary | ICD-10-CM

## 2023-11-11 DIAGNOSIS — I272 Pulmonary hypertension, unspecified: Secondary | ICD-10-CM | POA: Diagnosis present

## 2023-11-11 DIAGNOSIS — K8000 Calculus of gallbladder with acute cholecystitis without obstruction: Secondary | ICD-10-CM | POA: Diagnosis present

## 2023-11-11 LAB — COMPREHENSIVE METABOLIC PANEL WITH GFR
ALT: 51 U/L — ABNORMAL HIGH (ref 0–44)
AST: 54 U/L — ABNORMAL HIGH (ref 15–41)
Albumin: 3.2 g/dL — ABNORMAL LOW (ref 3.5–5.0)
Alkaline Phosphatase: 158 U/L — ABNORMAL HIGH (ref 38–126)
Anion gap: 15 (ref 5–15)
BUN: 31 mg/dL — ABNORMAL HIGH (ref 8–23)
CO2: 25 mmol/L (ref 22–32)
Calcium: 8.1 mg/dL — ABNORMAL LOW (ref 8.9–10.3)
Chloride: 94 mmol/L — ABNORMAL LOW (ref 98–111)
Creatinine, Ser: 4.93 mg/dL — ABNORMAL HIGH (ref 0.61–1.24)
GFR, Estimated: 12 mL/min — ABNORMAL LOW (ref >60)
Glucose, Bld: 148 mg/dL — ABNORMAL HIGH (ref 70–99)
Potassium: 3.8 mmol/L (ref 3.5–5.1)
Sodium: 134 mmol/L — ABNORMAL LOW (ref 135–145)
Total Bilirubin: 4.1 mg/dL — ABNORMAL HIGH (ref 0.0–1.2)
Total Protein: 8 g/dL (ref 6.5–8.1)

## 2023-11-11 LAB — I-STAT CG4 LACTIC ACID, ED
Lactic Acid, Venous: 2 mmol/L (ref 0.5–1.9)
Lactic Acid, Venous: 1.7 mmol/L (ref 0.5–1.9)

## 2023-11-11 LAB — CBC WITH DIFFERENTIAL/PLATELET
Abs Immature Granulocytes: 0.1 10*3/uL — ABNORMAL HIGH (ref 0.00–0.07)
Basophils Absolute: 0 10*3/uL (ref 0.0–0.1)
Basophils Relative: 0 %
Eosinophils Absolute: 0 10*3/uL (ref 0.0–0.5)
Eosinophils Relative: 0 %
HCT: 38.8 % — ABNORMAL LOW (ref 39.0–52.0)
Hemoglobin: 12.8 g/dL — ABNORMAL LOW (ref 13.0–17.0)
Immature Granulocytes: 1 %
Lymphocytes Relative: 5 %
Lymphs Abs: 0.7 10*3/uL (ref 0.7–4.0)
MCH: 31.1 pg (ref 26.0–34.0)
MCHC: 33 g/dL (ref 30.0–36.0)
MCV: 94.2 fL (ref 80.0–100.0)
Monocytes Absolute: 0.8 10*3/uL (ref 0.1–1.0)
Monocytes Relative: 7 %
Neutro Abs: 10.4 10*3/uL — ABNORMAL HIGH (ref 1.7–7.7)
Neutrophils Relative %: 87 %
Platelets: 81 10*3/uL — ABNORMAL LOW (ref 150–400)
RBC: 4.12 MIL/uL — ABNORMAL LOW (ref 4.22–5.81)
RDW: 14 % (ref 11.5–15.5)
WBC: 12.1 10*3/uL — ABNORMAL HIGH (ref 4.0–10.5)
nRBC: 0 % (ref 0.0–0.2)

## 2023-11-11 LAB — PROTIME-INR
INR: 1.4 — ABNORMAL HIGH (ref 0.8–1.2)
Prothrombin Time: 17.1 s — ABNORMAL HIGH (ref 11.4–15.2)

## 2023-11-11 LAB — RESP PANEL BY RT-PCR (RSV, FLU A&B, COVID)  RVPGX2
Influenza A by PCR: NEGATIVE
Influenza B by PCR: NEGATIVE
Resp Syncytial Virus by PCR: NEGATIVE
SARS Coronavirus 2 by RT PCR: NEGATIVE

## 2023-11-11 LAB — LIPASE, BLOOD: Lipase: 45 U/L (ref 11–51)

## 2023-11-11 MED ORDER — PIPERACILLIN-TAZOBACTAM 3.375 G IVPB 30 MIN: 3.3750 g | Freq: Four times a day (QID) | INTRAVENOUS | Status: DC

## 2023-11-11 MED ORDER — LACTATED RINGERS IV SOLN
INTRAVENOUS | Status: DC
  Administered 2023-11-11: 150 mL/h via INTRAVENOUS

## 2023-11-11 MED ORDER — PIPERACILLIN-TAZOBACTAM 3.375 G IVPB
3.3750 g | Freq: Three times a day (TID) | INTRAVENOUS | Status: DC
  Administered 2023-11-12 – 2023-11-13 (×4): 3.375 g via INTRAVENOUS
  Filled 2023-11-11 (×4): qty 50

## 2023-11-11 MED ORDER — PIPERACILLIN-TAZOBACTAM 3.375 G IVPB 30 MIN
3.3750 g | Freq: Once | INTRAVENOUS | Status: AC
  Administered 2023-11-11: 3.375 g via INTRAVENOUS
  Filled 2023-11-11: qty 50

## 2023-11-11 MED ORDER — ACETAMINOPHEN 325 MG PO TABS
650.0000 mg | ORAL_TABLET | Freq: Once | ORAL | Status: AC
  Administered 2023-11-11: 650 mg via ORAL
  Filled 2023-11-11: qty 2

## 2023-11-11 MED ORDER — LACTATED RINGERS IV BOLUS
1000.0000 mL | Freq: Once | INTRAVENOUS | Status: AC
  Administered 2023-11-11: 1000 mL via INTRAVENOUS

## 2023-11-11 NOTE — ED Notes (Signed)
 This RN approached patient concerning urine sample, patient states he rarely makes urine due to being dialysis patient. This RN left a urinal at bedside for patient to attempt, patient stated he was dialyzed earlier today and may not be able to make any urine at the moment.

## 2023-11-11 NOTE — ED Notes (Signed)
 Patient transported to MRI

## 2023-11-11 NOTE — ED Triage Notes (Addendum)
 Pt reports RLQ abdominal pain onset 2 months ago. He states his doctor told him his gallbladder needs to be removed. He reports emesis over the weekend.

## 2023-11-11 NOTE — ED Provider Notes (Signed)
 [place] EMERGENCY DEPARTMENT AT [facility] Provider Note   CSN: [id number] Arrival date & time: 11/11/23  1904     History  Chief Complaint  Patient presents with   Abdominal Pain    Phillip [name] is a 68 year old man with a history of hypertension, type 2 diabetes, hyperlipidemia, hypothyroidism, end-stage renal disease on hemodialysis, coronary disease status post CABG x 4 in 2003, chronic combined systolic and diastolic congestive heart failure, cholelithiasis and severe aortic stenosis. Patient reports that he was having nausea and vomiting over the weekend.  He has been having pain in his right upper quadrant all day.  He reports that he went to hemodialysis today and was told that he had a fever.  He was able to complete hemodialysis but was told he needs to come here.  He reports that over the weekend he saw one of his physicians who told him he probably needed his gallbladder taken out.  He has a history of cholelithiasis.  He denies any active vomiting today he was unaware that he had a fever he denies shaking chills or weakness.  Abdominal Pain      Home Medications Prior to Admission medications   Medication Sig Start Date End Date Taking? Authorizing Provider  acetaminophen  (TYLENOL ) 500 MG tablet Take 1,000 mg by mouth every 6 (six) hours as needed for moderate pain or headache.    Yes [provider]  aspirin  EC 81 MG tablet Take 1 tablet (81 mg total) by mouth daily. Swallow whole. 07/25/23  Yes Thukkani, Arun K, MD  atorvastatin  (LIPITOR) 20 MG tablet Take 1 tablet (20 mg total) by mouth daily. 07/25/23  Yes Thukkani, Arun K, MD  carvedilol  (COREG ) 3.125 MG tablet Take 1 tablet (3.125 mg total) by mouth 2 (two) times daily. 05/20/23 05/02/24 Yes Terrance Ferretti, NP  gabapentin  (NEURONTIN ) 300 MG capsule Take 1 capsule (300 mg total) by mouth at bedtime. Patient taking differently: Take 300 mg by mouth 2 (two) times daily. 08/25/23  Yes Arrien,  Mauricio Daniel, MD  insulin  glargine (LANTUS ) 100 UNIT/ML injection Inject 7 Units into the skin daily as needed (Diabetes). Inject 7 units at bedtime as needed after checking glucose   Yes [provider]  isosorbide  mononitrate (IMDUR ) 120 MG 24 hr tablet Take 1 tablet (120 mg total) by mouth daily. 08/27/23  Yes Revankar, Micael Adas, MD  latanoprost  (XALATAN ) 0.005 % ophthalmic solution Place 1 drop into both eyes at bedtime. 04/15/23  Yes [provider]  levothyroxine  (SYNTHROID ) 50 MCG tablet Take 50 mcg by mouth daily. 04/05/23  Yes [provider]  nitroGLYCERIN  (NITROSTAT ) 0.4 MG SL tablet Place 1 tablet (0.4 mg total) under the tongue every 5 (five) minutes as needed for chest pain. 11/20/20  Yes Revankar, Micael Adas, MD  tamsulosin  (FLOMAX ) 0.4 MG CAPS capsule Take 0.4 mg by mouth daily after breakfast. 04/19/20  Yes [provider]  vitamin B-12 (CYANOCOBALAMIN ) 500 MCG tablet Take 500 mcg by mouth daily.    Yes [provider]  Vitamin D, Ergocalciferol, (DRISDOL) 1.25 MG (50000 UNIT) CAPS capsule Take 50,000 Units by mouth once a week. Mondays 07/13/21  Yes [provider]      Allergies    Patient has no known allergies.    Review of Systems   Review of Systems  Gastrointestinal:  Positive for abdominal pain.    Physical Exam Updated Vital Signs BP 119/69   Pulse 92   Temp 98.9 F (  37.2 C) (Oral)   Resp 17   Ht 6' 1 (1.854 m)   Wt 83.5 kg   SpO2 97%   BMI 24.28 kg/m  Physical Exam Vitals and nursing note reviewed.  Constitutional:      General: He is not in acute distress.    Appearance: He is well-developed. He is not diaphoretic.     Comments: Hot to the touch  HENT:     Head: Normocephalic and atraumatic.  Eyes:     General: No scleral icterus.    Conjunctiva/sclera: Conjunctivae normal.  Cardiovascular:     Rate and Rhythm: Normal rate and regular rhythm.     Heart sounds: Normal heart sounds.  Pulmonary:      Effort: Pulmonary effort is normal. No respiratory distress.     Breath sounds: Normal breath sounds.  Chest:     Comments: Dialysis catheter in the left upper chest wall  Abdominal:     Palpations: Abdomen is soft.     Tenderness: There is abdominal tenderness in the left upper quadrant. There is guarding. Positive signs include Murphy's sign.  Musculoskeletal:     Cervical back: Normal range of motion and neck supple.  Skin:    General: Skin is warm and dry.  Neurological:     Mental Status: He is alert.  Psychiatric:        Behavior: Behavior normal.     ED Results / Procedures / Treatments   Labs (all labs ordered are listed, but only abnormal results are displayed) Labs Reviewed  COMPREHENSIVE METABOLIC PANEL WITH GFR - Abnormal; Notable for the following components:      Result Value   Sodium 134 (*)    Chloride 94 (*)    Glucose, Bld 148 (*)    BUN 31 (*)    Creatinine, Ser 4.93 (*)    Calcium  8.1 (*)    Albumin  3.2 (*)    AST 54 (*)    ALT 51 (*)    Alkaline Phosphatase 158 (*)    Total Bilirubin 4.1 (*)    GFR, Estimated 12 (*)    All other components within normal limits  CBC WITH DIFFERENTIAL/PLATELET - Abnormal; Notable for the following components:   WBC 12.1 (*)    RBC 4.12 (*)    Hemoglobin 12.8 (*)    HCT 38.8 (*)    Platelets 81 (*)    Neutro Abs 10.4 (*)    Abs Immature Granulocytes 0.10 (*)    All other components within normal limits  PROTIME-INR - Abnormal; Notable for the following components:   Prothrombin Time 17.1 (*)    INR 1.4 (*)    All other components within normal limits  I-STAT CG4 LACTIC ACID, ED - Abnormal; Notable for the following components:   Lactic Acid, Venous 2.0 (*)    All other components within normal limits  RESP PANEL BY RT-PCR (RSV, FLU A&B, COVID)  RVPGX2  CULTURE, BLOOD (ROUTINE X 2)  CULTURE, BLOOD (ROUTINE X 2)  URINALYSIS, W/ REFLEX TO CULTURE (INFECTION SUSPECTED)  LIPASE, BLOOD  I-STAT CG4 LACTIC ACID,  ED    EKG None  Radiology US  Abdomen Limited RUQ (LIVER/GB) Result Date: 11/11/2023 CLINICAL DATA:  151471 RUQ pain 151471 EXAM: ULTRASOUND ABDOMEN LIMITED RIGHT UPPER QUADRANT COMPARISON:  None Available. FINDINGS: Gallbladder: No gallstones or wall thickening visualized. No sonographic Murphy sign noted by sonographer. Common bile duct: Diameter: 4 mm Liver: No focal lesion identified. Within normal limits in parenchymal echogenicity. Portal  vein is patent on color Doppler imaging with normal direction of blood flow towards the liver. Other: None. IMPRESSION: Cholelithiasis with no acute cholecystitis. Electronically Signed   By: Morgane  Naveau M.D.   On: 11/11/2023 21:17   DG Chest Port 1 View Result Date: 11/11/2023 CLINICAL DATA:  Possible sepsis EXAM: PORTABLE CHEST 1 VIEW COMPARISON:  08/19/2023 FINDINGS: Cardiac shadow is enlarged but stable. Postsurgical changes are again seen. Right jugular dialysis catheter is noted. The lungs are well aerated. Elevation of the right hemidiaphragm is seen. No focal infiltrate or effusion is noted. No bony abnormality is seen. IMPRESSION: No active disease. Electronically Signed   By: Violeta Grey M.D.   On: 11/11/2023 20:50    Procedures Procedures    Medications Ordered in ED Medications  lactated ringers  infusion (150 mL/hr Intravenous New Bag/Given 11/11/23 2044)  piperacillin -tazobactam (ZOSYN ) IVPB 3.375 g (0 g Intravenous Stopped 11/11/23 2056)    Followed by  piperacillin -tazobactam (ZOSYN ) IVPB 3.375 g (has no administration in time range)  acetaminophen  (TYLENOL ) tablet 650 mg (650 mg Oral Given 11/11/23 1930)  lactated ringers  bolus 1,000 mL (0 mLs Intravenous Stopped 11/11/23 2128)    ED Course/ Medical Decision Making/ A&P Clinical Course as of 11/11/23 2316  Tue Nov 11, 2023  2044 WBC(!): 12.1 [AH]  2054 AST(!): 54 [AH]  2054 ALT(!): 51 [AH]  2054 Alkaline Phosphatase(!): 158 [AH]  2202 Case discussed with Dr. Aniceto Barley of  surgery.  She feels like his clinical story is more concerning for choledocholithiasis with cholangitis states that she feels it would be appropriate to consult with gastroenterology she will be happy to consult on the patient.  I have ordered a consult with gastroenterology and plan on a medicine admission. [AH]  2210 Dr. Raeanne Bull Gi recommends MRCP, Trend labs, will consult [AH]  2312 Stable 67 YOM with  ESRD here with CCX of AP.  RUQ  Bili 4. Fever 102 Surgery aware. -Octavia Belton consult. -[facility] MRCP and see in the AM  [CC]  2315 Shift handoff Dr. Urban Garden- needs admission, awaiting medicine call back  [AH]    Clinical Course User Index [AH] Roderica Cathell, PA-C [CC] Onetha Bile, MD                                 Medical Decision Making Amount and/or Complexity of Data Reviewed Labs:  Decision-making details documented in ED Course. Radiology: ordered.  Risk Prescription drug management. Decision regarding hospitalization.            Final Clinical Impression(s) / ED Diagnoses Final diagnoses:  Fever, unspecified fever cause  RUQ pain  Calculus of gallbladder without cholecystitis without obstruction  Transaminitis    Rx / DC Orders ED Discharge Orders     None         Tama Fails, PA-C 11/11/23 2316    Merdis Stalling, MD 11/16/23 1226

## 2023-11-11 NOTE — Sepsis Progress Note (Signed)
 Elink monitoring for the code sepsis protocol.

## 2023-11-12 ENCOUNTER — Inpatient Hospital Stay (HOSPITAL_COMMUNITY)

## 2023-11-12 ENCOUNTER — Encounter (HOSPITAL_COMMUNITY): Payer: Self-pay

## 2023-11-12 DIAGNOSIS — E039 Hypothyroidism, unspecified: Secondary | ICD-10-CM | POA: Diagnosis present

## 2023-11-12 DIAGNOSIS — D6959 Other secondary thrombocytopenia: Secondary | ICD-10-CM | POA: Diagnosis present

## 2023-11-12 DIAGNOSIS — D631 Anemia in chronic kidney disease: Secondary | ICD-10-CM | POA: Diagnosis present

## 2023-11-12 DIAGNOSIS — N186 End stage renal disease: Secondary | ICD-10-CM | POA: Diagnosis present

## 2023-11-12 DIAGNOSIS — Z1152 Encounter for screening for COVID-19: Secondary | ICD-10-CM | POA: Diagnosis not present

## 2023-11-12 DIAGNOSIS — E785 Hyperlipidemia, unspecified: Secondary | ICD-10-CM | POA: Diagnosis present

## 2023-11-12 DIAGNOSIS — Z794 Long term (current) use of insulin: Secondary | ICD-10-CM | POA: Diagnosis not present

## 2023-11-12 DIAGNOSIS — I2510 Atherosclerotic heart disease of native coronary artery without angina pectoris: Secondary | ICD-10-CM | POA: Diagnosis present

## 2023-11-12 DIAGNOSIS — I152 Hypertension secondary to endocrine disorders: Secondary | ICD-10-CM | POA: Diagnosis present

## 2023-11-12 DIAGNOSIS — E1142 Type 2 diabetes mellitus with diabetic polyneuropathy: Secondary | ICD-10-CM | POA: Diagnosis present

## 2023-11-12 DIAGNOSIS — E1151 Type 2 diabetes mellitus with diabetic peripheral angiopathy without gangrene: Secondary | ICD-10-CM | POA: Diagnosis present

## 2023-11-12 DIAGNOSIS — I700 Atherosclerosis of aorta: Secondary | ICD-10-CM | POA: Diagnosis present

## 2023-11-12 DIAGNOSIS — K810 Acute cholecystitis: Secondary | ICD-10-CM

## 2023-11-12 DIAGNOSIS — A419 Sepsis, unspecified organism: Secondary | ICD-10-CM

## 2023-11-12 DIAGNOSIS — I350 Nonrheumatic aortic (valve) stenosis: Secondary | ICD-10-CM | POA: Diagnosis present

## 2023-11-12 DIAGNOSIS — K831 Obstruction of bile duct: Secondary | ICD-10-CM | POA: Diagnosis not present

## 2023-11-12 DIAGNOSIS — E1149 Type 2 diabetes mellitus with other diabetic neurological complication: Secondary | ICD-10-CM | POA: Diagnosis present

## 2023-11-12 DIAGNOSIS — K8050 Calculus of bile duct without cholangitis or cholecystitis without obstruction: Secondary | ICD-10-CM

## 2023-11-12 DIAGNOSIS — E872 Acidosis, unspecified: Secondary | ICD-10-CM | POA: Diagnosis present

## 2023-11-12 DIAGNOSIS — R7989 Other specified abnormal findings of blood chemistry: Secondary | ICD-10-CM | POA: Diagnosis not present

## 2023-11-12 DIAGNOSIS — I5042 Chronic combined systolic (congestive) and diastolic (congestive) heart failure: Secondary | ICD-10-CM | POA: Diagnosis present

## 2023-11-12 DIAGNOSIS — R7401 Elevation of levels of liver transaminase levels: Secondary | ICD-10-CM | POA: Diagnosis present

## 2023-11-12 DIAGNOSIS — J45909 Unspecified asthma, uncomplicated: Secondary | ICD-10-CM | POA: Diagnosis present

## 2023-11-12 DIAGNOSIS — R1011 Right upper quadrant pain: Secondary | ICD-10-CM | POA: Diagnosis present

## 2023-11-12 DIAGNOSIS — I272 Pulmonary hypertension, unspecified: Secondary | ICD-10-CM | POA: Diagnosis present

## 2023-11-12 DIAGNOSIS — K8000 Calculus of gallbladder with acute cholecystitis without obstruction: Secondary | ICD-10-CM | POA: Diagnosis present

## 2023-11-12 DIAGNOSIS — Z992 Dependence on renal dialysis: Secondary | ICD-10-CM | POA: Diagnosis not present

## 2023-11-12 DIAGNOSIS — E1122 Type 2 diabetes mellitus with diabetic chronic kidney disease: Secondary | ICD-10-CM | POA: Diagnosis present

## 2023-11-12 DIAGNOSIS — I25810 Atherosclerosis of coronary artery bypass graft(s) without angina pectoris: Secondary | ICD-10-CM | POA: Diagnosis present

## 2023-11-12 HISTORY — PX: IR PERC CHOLECYSTOSTOMY: IMG2326

## 2023-11-12 LAB — CBC
HCT: 32.9 % — ABNORMAL LOW (ref 39.0–52.0)
Hemoglobin: 10.8 g/dL — ABNORMAL LOW (ref 13.0–17.0)
MCH: 30.6 pg (ref 26.0–34.0)
MCHC: 32.8 g/dL (ref 30.0–36.0)
MCV: 93.2 fL (ref 80.0–100.0)
Platelets: 76 10*3/uL — ABNORMAL LOW (ref 150–400)
RBC: 3.53 MIL/uL — ABNORMAL LOW (ref 4.22–5.81)
RDW: 14.1 % (ref 11.5–15.5)
WBC: 8.1 10*3/uL (ref 4.0–10.5)
nRBC: 0 % (ref 0.0–0.2)

## 2023-11-12 LAB — COMPREHENSIVE METABOLIC PANEL WITH GFR
ALT: 42 U/L (ref 0–44)
AST: 49 U/L — ABNORMAL HIGH (ref 15–41)
Albumin: 2.7 g/dL — ABNORMAL LOW (ref 3.5–5.0)
Alkaline Phosphatase: 134 U/L — ABNORMAL HIGH (ref 38–126)
Anion gap: 14 (ref 5–15)
BUN: 35 mg/dL — ABNORMAL HIGH (ref 8–23)
CO2: 24 mmol/L (ref 22–32)
Calcium: 7.8 mg/dL — ABNORMAL LOW (ref 8.9–10.3)
Chloride: 96 mmol/L — ABNORMAL LOW (ref 98–111)
Creatinine, Ser: 5.52 mg/dL — ABNORMAL HIGH (ref 0.61–1.24)
GFR, Estimated: 11 mL/min — ABNORMAL LOW (ref >60)
Glucose, Bld: 85 mg/dL (ref 70–99)
Potassium: 3.7 mmol/L (ref 3.5–5.1)
Sodium: 134 mmol/L — ABNORMAL LOW (ref 135–145)
Total Bilirubin: 4.8 mg/dL — ABNORMAL HIGH (ref 0.0–1.2)
Total Protein: 7 g/dL (ref 6.5–8.1)

## 2023-11-12 LAB — PROTIME-INR
INR: 1.3 — ABNORMAL HIGH (ref 0.8–1.2)
Prothrombin Time: 16.5 s — ABNORMAL HIGH (ref 11.4–15.2)

## 2023-11-12 LAB — GLUCOSE, CAPILLARY
Glucose-Capillary: 95 mg/dL (ref 70–99)
Glucose-Capillary: 82 mg/dL (ref 70–99)
Glucose-Capillary: 80 mg/dL (ref 70–99)
Glucose-Capillary: 134 mg/dL — ABNORMAL HIGH (ref 70–99)
Glucose-Capillary: 149 mg/dL — ABNORMAL HIGH (ref 70–99)

## 2023-11-12 MED ORDER — CHLORHEXIDINE GLUCONATE CLOTH 2 % EX PADS
6.0000 | MEDICATED_PAD | Freq: Every day | CUTANEOUS | Status: DC
  Administered 2023-11-12 – 2023-11-14 (×3): 6 via TOPICAL

## 2023-11-12 MED ORDER — ACETAMINOPHEN 325 MG PO TABS: 650.0000 mg | ORAL_TABLET | Freq: Four times a day (QID) | ORAL | Status: DC | PRN

## 2023-11-12 MED ORDER — ACETAMINOPHEN 650 MG RE SUPP: 650.0000 mg | Freq: Four times a day (QID) | RECTAL | Status: DC | PRN

## 2023-11-12 MED ORDER — INSULIN ASPART 100 UNIT/ML IJ SOLN: 0.0000 [IU] | INTRAMUSCULAR | Status: DC

## 2023-11-12 MED ORDER — FENTANYL CITRATE (PF) 100 MCG/2ML IJ SOLN
INTRAMUSCULAR | Status: AC
Start: 2023-11-12 — End: 2023-11-12
  Filled 2023-11-12: qty 4

## 2023-11-12 MED ORDER — LIDOCAINE-EPINEPHRINE 1 %-1:100000 IJ SOLN
INTRAMUSCULAR | Status: AC
Start: 2023-11-12 — End: 2023-11-12
  Filled 2023-11-12: qty 1

## 2023-11-12 MED ORDER — IOHEXOL 300 MG/ML  SOLN
50.0000 mL | Freq: Once | INTRAMUSCULAR | Status: AC | PRN
  Administered 2023-11-12: 5 mL

## 2023-11-12 MED ORDER — SODIUM CHLORIDE 0.9% FLUSH
5.0000 mL | Freq: Three times a day (TID) | INTRAVENOUS | Status: AC
Start: 2023-11-12 — End: ?
  Administered 2023-11-12 – 2023-11-14 (×6): 5 mL

## 2023-11-12 MED ORDER — FENTANYL CITRATE (PF) 100 MCG/2ML IJ SOLN
INTRAMUSCULAR | Status: AC | PRN
  Administered 2023-11-12: 50 ug via INTRAVENOUS

## 2023-11-12 MED ORDER — HYDRALAZINE HCL 20 MG/ML IJ SOLN: 5.0000 mg | Freq: Four times a day (QID) | INTRAMUSCULAR | Status: DC | PRN

## 2023-11-12 MED ORDER — LIDOCAINE-EPINEPHRINE 1 %-1:100000 IJ SOLN
20.0000 mL | Freq: Once | INTRAMUSCULAR | Status: AC
  Administered 2023-11-12: 10 mL via INTRADERMAL

## 2023-11-12 MED ORDER — HYDROMORPHONE HCL 1 MG/ML IJ SOLN
0.5000 mg | INTRAMUSCULAR | Status: DC | PRN
  Administered 2023-11-12 – 2023-11-13 (×3): 0.5 mg via INTRAVENOUS
  Filled 2023-11-12 (×3): qty 0.5

## 2023-11-12 MED ORDER — ONDANSETRON HCL 4 MG/2ML IJ SOLN
INTRAMUSCULAR | Status: AC
Start: 2023-11-12 — End: 2023-11-12
  Filled 2023-11-12: qty 2

## 2023-11-12 MED ORDER — GADOBUTROL 1 MMOL/ML IV SOLN
8.0000 mL | Freq: Once | INTRAVENOUS | Status: AC | PRN
  Administered 2023-11-12: 8 mL via INTRAVENOUS

## 2023-11-12 MED ORDER — MIDAZOLAM HCL 2 MG/2ML IJ SOLN
INTRAMUSCULAR | Status: AC | PRN
  Administered 2023-11-12: .5 mg via INTRAVENOUS

## 2023-11-12 MED ORDER — NALOXONE HCL 0.4 MG/ML IJ SOLN: 0.4000 mg | INTRAMUSCULAR | Status: DC | PRN

## 2023-11-12 MED ORDER — MIDAZOLAM HCL 2 MG/2ML IJ SOLN
INTRAMUSCULAR | Status: AC
Start: 2023-11-12 — End: 2023-11-12
  Filled 2023-11-12: qty 4

## 2023-11-12 MED ORDER — ONDANSETRON HCL 4 MG/2ML IJ SOLN
INTRAMUSCULAR | Status: AC | PRN
  Administered 2023-11-12: 4 mg via INTRAVENOUS

## 2023-11-12 NOTE — H&P (Addendum)
 History and Physical    SHADEN LACHER ZOX:[id number] DOB: [birth year] DOA: 11/11/2023  PCP: Abbe Hoard., MD  Patient coming from: Home  Chief Complaint: Abdominal pain  HPI: Phillip [name] is a 68 y.o. male with medical history significant of ESRD on HD TTS, severe aortic stenosis scheduled for upcoming TAVR on 11/18/2023, HFrEF, CAD status post CABG in 2003, type 2 diabetes, hypertension, hyperlipidemia, PAD, hypothyroidism, pulmonary hypertension, history of laparoscopic appendectomy presented to the ED for evaluation of right upper quadrant abdominal pain, nausea, vomiting, and fever noted at dialysis.  Vital signs on arrival: Temperature 102.2 F, pulse 104, respiratory rate 21, blood pressure 97/57, and SpO2 96% on room air.  Labs notable for WBC count 12.1, hemoglobin 12.8 (improved), platelet count 81k (previously in the 120-130k range in March 2025), sodium 134, potassium 3.8, bicarb 25, AST 54, ALT 51, alk phos 158, T. bili 4.1, lipase normal, blood cultures in process, lactic acid 2.0> 1.7, COVID/influenza/RSV PCR negative.  Chest x-ray showing no active disease.  Right upper quadrant abdominal ultrasound showing cholelithiasis but no evidence of acute cholecystitis.  EKG without acute ischemic changes.  Patient was given Tylenol , Zosyn , and 1 L IV fluids.  General surgery evaluated the patient and recommended medical admission for GI consultation and antibiotics due to concern for choledocholithiasis and possible cholangitis.  No plans for laparoscopic cholecystectomy at this time given no evidence of cholecystitis on ultrasound and also would be high risk for surgery without performing TAVR.  Case was discussed with Dr. Blanche Bunker [facility] who recommended MRCP and their service will consult.  Patient is reporting right upper quadrant abdominal pain for the past 1 month.  Pain has been much worse for the past few days.  About 4 days ago he had nausea and vomiting which has since  resolved.  He was told he had a fever at dialysis yesterday.  No other complaints.  Denies cough, shortness of breath, or chest pain.  Review of Systems:  Review of Systems  All other systems reviewed and are negative.   Past Medical History:  Diagnosis Date   Abdominopelvic abscess (HCC) 09/29/2018   Abscess of appendix 09/22/2018   Acquired spondylolisthesis of lumbosacral region 03/20/2020   Anemia of chronic disease 08/18/2019   Aortic atherosclerosis (HCC)    Aortic stenosis    a.) TTE 09/15/2018: mild-mod (MPG 19); b.) TTE 03/21/2021: mild-mod (MPG 14.3); c.) TTE 04/24/2022: mild- mod (MPG 15)   Appendicitis 09/08/2018   Asthma    Benign hypertension with CKD (chronic kidney disease) stage IV (HCC) 02/24/2018   Benign prostatic hyperplasia without lower urinary tract symptoms 01/14/2018   Bilateral lower extremity edema 11/04/2018   Bradycardia 03/25/2018   Bruit of right carotid artery 07/26/2015   CAD (coronary artery disease) 2003   a.) s/p 4v CABG 2003   Cardiac murmur 07/26/2015   Chronic combined systolic and diastolic CHF (congestive heart failure) (HCC) 07/26/2015   a.) TTE 09/15/2018: EF 50-55%, mod LVH, RVE, BAE, mild-mod TR, AoV sclerosis, G1DD; b.) MPI 10/27/2019: EF <30%; c.) TTE 03/21/2021: EF 35-40%, post AK, inf HK, mod LVH, mod red RVSF, mod LAE, mod Aov sclerosis, G2DD; c.) TTE 04/24/2022: EF 35-40%, post AK, glok HK, mod LVE, mod red RVSF, mild-mod MR, Aov sclerosis, G3DD   Chronic idiopathic constipation 11/02/2018   Chronic low back pain without sciatica 12/02/2018   Chronic pain of right hip 03/31/2020   CKD (chronic kidney disease) stage 4, GFR 15-29 ml/min (HCC) 10/07/2018  Coronary artery disease    Dysphagia 07/26/2015   Erectile dysfunction 07/14/2017   Essential hypertension 02/24/2018   Foot ulcer, left (HCC)    GERD (gastroesophageal reflux disease)    Hematuria 07/27/2015   History of marijuana use    Hyperlipidemia 07/26/2015    Hypertension    Hypothyroidism 11/14/2015   Insomnia 11/02/2018   Lumbar spondylosis 03/20/2020   Malaise and fatigue 03/25/2018   Myocardial infarction due to demand ischemia (HCC) 09/26/2020   a.) Type II NSTEMI; b.) troponins were trended 0.54 --> 0.56 --> 0.52 ng/mL   Non-compliance 05/05/2019   Osseous and subluxation stenosis of intervertebral foramina of lumbar region 03/20/2020   Osteoarthritis 10/06/2019   Peripheral vascular disease (HCC)    Personal history of tobacco use, presenting hazards to health 09/27/2020   Polyneuropathy in diabetes (HCC) 05/07/2019   Pulmonary HTN (HCC)    a.) TTE 05/12/2019: PASP 71; b.) TTE 09/27/2020: PASP >70; c.) TTE 03/21/2021: RVSP 43; d.) TTE 04/24/2022: RVSP 80.6   Puncture wound of right hip 04/02/2018   PVD (peripheral vascular disease) with claudication (HCC)    a.) s/p PTA 03/02/2018 - balloon angioplasty LEFT below knee popliteal artery; b.) s/p PTA 09/30/2022: baloon angioplasty LEFT tibioperoneal trunck, most proximal peroneal artery, and LEFT popliteal artery.   S/P CABG x 4 2003   Sleep apnea 07/26/2015   a.) does not require nocturnal PAP therapy   Subacute osteomyelitis of left foot (HCC) 03/25/2018   Thrombocytopenia (HCC)    Type 2 diabetes mellitus with stage 4 chronic kidney disease, with long-term current use of insulin  (HCC) 07/26/2015   Vitamin B12 deficiency 06/16/2019   Vitamin D deficiency 05/07/2018    Past Surgical History:  Procedure Laterality Date   AMPUTATION TOE Left 03/13/2018   Procedure: AMPUTATION TOE-MPJ;  Surgeon: Anell Baptist, DPM;  Location: [facility];  Service: Podiatry;  Laterality: Left;   ANGIOPLASTY     AV FISTULA PLACEMENT Left 08/20/2023   Procedure: ARTERIOVENOUS (AV) FISTULA CREATION;  Surgeon: Young Hensen, MD;  Location: [facility];  Service: Vascular;  Laterality: Left;   CARDIAC CATHETERIZATION     CORONARY ARTERY BYPASS GRAFT N/A 2003   IR FLUORO GUIDE CV LINE RIGHT  08/18/2023    IR US  GUIDE VASC ACCESS RIGHT  08/18/2023   LAPAROSCOPIC APPENDECTOMY N/A 09/08/2018   Procedure: APPENDECTOMY LAPAROSCOPIC;  Surgeon: Alben Alma, MD;  Location: [facility];  Service: General;  Laterality: N/A;   LOWER EXTREMITY ANGIOGRAPHY Left 03/02/2018   Procedure: LOWER EXTREMITY ANGIOGRAPHY;  Surgeon: Celso College, MD;  Location: [facility];  Service: Cardiovascular;  Laterality: Left;   LOWER EXTREMITY ANGIOGRAPHY Left 09/30/2022   Procedure: Lower Extremity Angiography;  Surgeon: Celso College, MD;  Location: [facility];  Service: Cardiovascular;  Laterality: Left;   RIGHT/LEFT HEART CATH AND CORONARY/GRAFT ANGIOGRAPHY N/A 08/01/2023   Procedure: RIGHT/LEFT HEART CATH AND CORONARY/GRAFT ANGIOGRAPHY;  Surgeon: Kyra Phy, MD;  Location: [facility];  Service: Cardiovascular;  Laterality: N/A;   ROTATOR CUFF REPAIR Left      reports that he quit smoking about 22 years ago. His smoking use included cigarettes. He started smoking about 47 years ago. He has a 12.5 pack-year smoking history. He has never used smokeless tobacco. He reports that he does not currently use alcohol. He reports that he does not currently use drugs after having used the following drugs: Marijuana.  No Known Allergies  Family History  Problem Relation  Age of Onset   Hyperlipidemia Mother    Hypertension Mother    Diabetes Mother    Heart attack Brother    Heart disease Brother    Lung disease Father     Prior to Admission medications   Medication Sig Start Date End Date Taking? Authorizing Provider  acetaminophen  (TYLENOL ) 500 MG tablet Take 1,000 mg by mouth every 6 (six) hours as needed for moderate pain or headache.    Yes [provider]  aspirin  EC 81 MG tablet Take 1 tablet (81 mg total) by mouth daily. Swallow whole. 07/25/23  Yes Thukkani, Arun K, MD  atorvastatin  (LIPITOR) 20 MG tablet Take 1 tablet (20 mg total) by mouth daily. 07/25/23  Yes Thukkani, Arun K,  MD  carvedilol  (COREG ) 3.125 MG tablet Take 1 tablet (3.125 mg total) by mouth 2 (two) times daily. 05/20/23 05/02/24 Yes Terrance Ferretti, NP  gabapentin  (NEURONTIN ) 300 MG capsule Take 1 capsule (300 mg total) by mouth at bedtime. Patient taking differently: Take 300 mg by mouth 2 (two) times daily. 08/25/23  Yes Arrien, Mauricio Daniel, MD  insulin  glargine (LANTUS ) 100 UNIT/ML injection Inject 7 Units into the skin daily as needed (Diabetes). Inject 7 units at bedtime as needed after checking glucose   Yes [provider]  isosorbide  mononitrate (IMDUR ) 120 MG 24 hr tablet Take 1 tablet (120 mg total) by mouth daily. 08/27/23  Yes Revankar, Micael Adas, MD  latanoprost  (XALATAN ) 0.005 % ophthalmic solution Place 1 drop into both eyes at bedtime. 04/15/23  Yes [provider]  levothyroxine  (SYNTHROID ) 50 MCG tablet Take 50 mcg by mouth daily. 04/05/23  Yes [provider]  nitroGLYCERIN  (NITROSTAT ) 0.4 MG SL tablet Place 1 tablet (0.4 mg total) under the tongue every 5 (five) minutes as needed for chest pain. 11/20/20  Yes Revankar, Micael Adas, MD  tamsulosin  (FLOMAX ) 0.4 MG CAPS capsule Take 0.4 mg by mouth daily after breakfast. 04/19/20  Yes [provider]  vitamin B-12 (CYANOCOBALAMIN ) 500 MCG tablet Take 500 mcg by mouth daily.    Yes [provider]  Vitamin D, Ergocalciferol, (DRISDOL) 1.25 MG (50000 UNIT) CAPS capsule Take 50,000 Units by mouth once a week. Mondays 07/13/21  Yes [provider]    Physical Exam: Vitals:   11/11/23 2315 11/12/23 0117 11/12/23 0122 11/12/23 0417  BP: 121/70 (!) 115/57  110/69  Pulse: 94 85  87  Resp: 19     Temp:  98.5 F (36.9 C)  99.1 F (37.3 C)  TempSrc:  Oral  Oral  SpO2: 96% 97%  94%  Weight:   84 kg   Height:   6' 1 (1.854 m)     Physical Exam Vitals reviewed.  Constitutional:      General: He is not in acute distress. HENT:     Head: Normocephalic and atraumatic.  Eyes:     Extraocular  Movements: Extraocular movements intact.  Cardiovascular:     Rate and Rhythm: Normal rate and regular rhythm.     Pulses: Normal pulses.  Pulmonary:     Effort: Pulmonary effort is normal. No respiratory distress.     Breath sounds: Normal breath sounds. No wheezing or rales.  Abdominal:     General: Bowel sounds are normal. There is no distension.     Palpations: Abdomen is soft.     Tenderness: There is abdominal tenderness. There is guarding. There is no rebound.     Comments: Right upper quadrant tender to  palpation  Musculoskeletal:     Cervical back: Normal range of motion.     Right lower leg: No edema.     Left lower leg: No edema.  Skin:    General: Skin is warm and dry.  Neurological:     General: No focal deficit present.     Mental Status: He is alert and oriented to person, place, and time.     Labs on Admission: I have personally reviewed following labs and imaging studies  CBC: Recent Labs  Lab 11/11/23 1930  WBC 12.1*  NEUTROABS 10.4*  HGB 12.8*  HCT 38.8*  MCV 94.2  PLT 81*   Basic Metabolic Panel: Recent Labs  Lab 11/11/23 1930  NA 134*  K 3.8  CL 94*  CO2 25  GLUCOSE 148*  BUN 31*  CREATININE 4.93*  CALCIUM  8.1*   GFR: Estimated Creatinine Clearance: 16.2 mL/min (A) (by C-G formula based on SCr of 4.93 mg/dL (H)). Liver Function Tests: Recent Labs  Lab 11/11/23 1930  AST 54*  ALT 51*  ALKPHOS 158*  BILITOT 4.1*  PROT 8.0  ALBUMIN  3.2*   Recent Labs  Lab 11/11/23 1930  LIPASE 45   No results for input(s): AMMONIA in the last 168 hours. Coagulation Profile: Recent Labs  Lab 11/11/23 1930  INR 1.4*   Cardiac Enzymes: No results for input(s): CKTOTAL, CKMB, CKMBINDEX, TROPONINI in the last 168 hours. BNP (last 3 results) Recent Labs    07/14/23 1412  PROBNP 11,269*   HbA1C: No results for input(s): HGBA1C in the last 72 hours. CBG: Recent Labs  Lab 11/12/23 0145  GLUCAP 95   Lipid Profile: No  results for input(s): CHOL, HDL, LDLCALC, TRIG, CHOLHDL, LDLDIRECT in the last 72 hours. Thyroid  Function Tests: No results for input(s): TSH, T4TOTAL, FREET4, T3FREE, THYROIDAB in the last 72 hours. Anemia Panel: No results for input(s): VITAMINB12, FOLATE, FERRITIN, TIBC, IRON , RETICCTPCT in the last 72 hours. Urine analysis:    Component Value Date/Time   COLORURINE YELLOW 11/07/2020 0744   APPEARANCEUR CLEAR 11/07/2020 0744   LABSPEC 1.011 11/07/2020 0744   PHURINE 5.0 11/07/2020 0744   GLUCOSEU 50 (A) 11/07/2020 0744   HGBUR SMALL (A) 11/07/2020 0744   BILIRUBINUR NEGATIVE 11/07/2020 0744   KETONESUR NEGATIVE 11/07/2020 0744   PROTEINUR 100 (A) 11/07/2020 0744   NITRITE NEGATIVE 11/07/2020 0744   LEUKOCYTESUR NEGATIVE 11/07/2020 0744    Radiological Exams on Admission: US  Abdomen Limited RUQ (LIVER/GB) Result Date: 11/11/2023 CLINICAL DATA:  151471 RUQ pain 151471 EXAM: ULTRASOUND ABDOMEN LIMITED RIGHT UPPER QUADRANT COMPARISON:  None Available. FINDINGS: Gallbladder: No gallstones or wall thickening visualized. No sonographic Murphy sign noted by sonographer. Common bile duct: Diameter: 4 mm Liver: No focal lesion identified. Within normal limits in parenchymal echogenicity. Portal vein is patent on color Doppler imaging with normal direction of blood flow towards the liver. Other: None. IMPRESSION: Cholelithiasis with no acute cholecystitis. Electronically Signed   By: Morgane  Naveau M.D.   On: 11/11/2023 21:17   DG Chest Port 1 View Result Date: 11/11/2023 CLINICAL DATA:  Possible sepsis EXAM: PORTABLE CHEST 1 VIEW COMPARISON:  08/19/2023 FINDINGS: Cardiac shadow is enlarged but stable. Postsurgical changes are again seen. Right jugular dialysis catheter is noted. The lungs are well aerated. Elevation of the right hemidiaphragm is seen. No focal infiltrate or effusion is noted. No bony abnormality is seen. IMPRESSION: No active disease.  Electronically Signed   By: Violeta Grey M.D.   On: 11/11/2023 20:50  Assessment and Plan  Sepsis secondary to acute cholecystitis and possible ascending cholangitis Patient is presenting with right upper quadrant abdominal pain, fever, tachycardia, tachypnea, mild leukocytosis, borderline lactic acidosis, and elevated liver enzymes with T. bili 4.1.  MRCP showing gallstones, acute cholecystitis, possible ascending cholangitis, but no definite evidence of choledocholithiasis.  General surgery had evaluated the patient before MRCP results were back and felt that he would be high risk for surgery without performing TAVR given known history of severe aortic stenosis.  I have notified general surgery of MRCP results.  Cardiology needs to be consulted in the morning for decision making.  Sepsis physiology now improved, no longer febrile, tachycardic, or tachypneic.  Patient was given 1 L IV fluids in the ED after which mild lactic acidosis resolved.  Not hypotensive at present.  Keep NPO.  Continue Zosyn  and pain management.  Follow-up blood cultures.  Monitor WBC count and LFTs.  Addendum: General Surgery recommending a multi-disciplinary conversation with cards and IR regarding utility of biliary drainage by IR and/or cholecystostomy tube vs lap chole.  Consult cardiology and IR in the morning.  ESRD on HD TTS Potassium and bicarb within normal range.  No signs of volume overload at this time.  Consult nephrology in the morning.  Thrombocytopenia In the setting of end-stage renal disease.  Platelet count worse compared to labs done in March in the setting of sepsis.  No signs of bleeding.  Continue to monitor CBC.  Severe aortic stenosis Patient is scheduled for upcoming TAVR on 11/18/2023 but now presenting with sepsis secondary to acute cholecystitis and possible ascending cholangitis.  High risk for laparoscopic cholecystectomy and cardiology needs to be consulted in the morning for  decision-making.  Chronic HFrEF Last echo done in November 2023 showing EF 35 to 40% and grade 3 diastolic dysfunction.  No signs of volume overload at this time.  Volume management with dialysis/consult nephrology in the morning.  CAD status post CABG in 2003 Patient is not endorsing anginal symptoms.  Holding aspirin  at this time.  Type 2 diabetes A1c 5.4 on 09/11/2023.  Placed on very sensitive sliding scale insulin  every 4 hours.  Severe pulmonary hypertension Noted on previous echo.  Hypertension: IV hydralazine  PRN. Hyperlipidemia PAD Hypothyroidism Holding p.o. meds at this time given n.p.o. status.  DVT prophylaxis: SCDs Code Status: Full Code (discussed with the patient) Level of care: Telemetry bed Admission status: It is my clinical opinion that admission to INPATIENT is reasonable and necessary because of the expectation that this patient will require hospital care that crosses at least 2 midnights to treat this condition based on the medical complexity of the problems presented.  Given the aforementioned information, the predictability of an adverse outcome is felt to be significant.  Juliette Oh MD [facility]  If 7PM-7AM, please contact night-coverage [email or web address]  11/12/2023, 4:45 AM

## 2023-11-12 NOTE — Sedation Documentation (Signed)
 Patient states nausea improved with Zofran  4 mg IVP and cold wash cloth.

## 2023-11-12 NOTE — Progress Notes (Addendum)
 Subjective: CC: Continued RUQ abdominal pain and nausea.   Objective: Vital signs in last 24 hours: Temp:  [98.5 F (36.9 C)-102.2 F (39 C)] 99.1 F (37.3 C) (06/11 0417) Pulse Rate:  [85-108] 87 (06/11 0417) Resp:  [15-21] 19 (06/10 2315) BP: (97-123)/(57-77) 110/69 (06/11 0417) SpO2:  [94 %-100 %] 94 % (06/11 0417) Weight:  [83.5 kg-84 kg] 84 kg (06/11 0122) Last BM Date : 11/09/23  Intake/Output from previous day: 06/10 0701 - 06/11 0700 In: 1050 [IV Piggyback:1050] Out: -  Intake/Output this shift: No intake/output data recorded.  PE: Gen:  Alert, NAD, pleasant Abd: Soft, ND, RUQ ttp  Lab Results:  Recent Labs    11/11/23 1930 11/12/23 0631  WBC 12.1* 8.1  HGB 12.8* 10.8*  HCT 38.8* 32.9*  PLT 81* 76*   BMET Recent Labs    11/11/23 1930 11/12/23 0631  NA 134* 134*  K 3.8 3.7  CL 94* 96*  CO2 25 24  GLUCOSE 148* 85  BUN 31* 35*  CREATININE 4.93* 5.52*  CALCIUM  8.1* 7.8*   PT/INR Recent Labs    11/11/23 1930  LABPROT 17.1*  INR 1.4*   CMP     Component Value Date/Time   NA 134 (L) 11/12/2023 0631   NA 145 (H) 07/14/2023 1412   NA 136 08/30/2013 1025   K 3.7 11/12/2023 0631   K 3.7 08/30/2013 1025   CL 96 (L) 11/12/2023 0631   CL 101 08/30/2013 1025   CO2 24 11/12/2023 0631   CO2 31 08/30/2013 1025   GLUCOSE 85 11/12/2023 0631   GLUCOSE 310 (H) 08/30/2013 1025   BUN 35 (H) 11/12/2023 0631   BUN 97 (HH) 07/14/2023 1412   BUN 17 08/30/2013 1025   CREATININE 5.52 (H) 11/12/2023 0631   CREATININE 1.15 08/30/2013 1025   CALCIUM  7.8 (L) 11/12/2023 0631   CALCIUM  9.4 08/30/2013 1025   PROT 7.0 11/12/2023 0631   PROT 7.2 08/10/2019 1043   ALBUMIN  2.7 (L) 11/12/2023 0631   ALBUMIN  3.9 08/10/2019 1043   AST 49 (H) 11/12/2023 0631   ALT 42 11/12/2023 0631   ALKPHOS 134 (H) 11/12/2023 0631   BILITOT 4.8 (H) 11/12/2023 0631   BILITOT 0.7 08/10/2019 1043   GFRNONAA 11 (L) 11/12/2023 0631   GFRNONAA >60 08/30/2013 1025   GFRAA 27  (L) 02/22/2020 0539   GFRAA >60 08/30/2013 1025   Lipase     Component Value Date/Time   LIPASE 45 11/11/2023 1930    Studies/Results: MR ABDOMEN MRCP W WO CONTAST Result Date: 11/12/2023 CLINICAL DATA:  Cholelithiasis on ultrasound. EXAM: MRI ABDOMEN WITHOUT AND WITH CONTRAST (INCLUDING MRCP) TECHNIQUE: Multiplanar multisequence MR imaging of the abdomen was performed both before and after the administration of intravenous contrast. Heavily T2-weighted images of the biliary and pancreatic ducts were obtained, and three-dimensional MRCP images were rendered by post processing. CONTRAST:  8mL GADAVIST GADOBUTROL 1 MMOL/ML IV SOLN COMPARISON:  Ultrasound exam from 1 day prior. CTA abdomen/pelvis 10/10/2023. FINDINGS: Lower chest: No acute findings. Hepatobiliary: Markedly low signal intensity identified in the liver parenchyma on T2 imaging with paradoxical increased signal intensity on out of phase T1 weighted imaging, features compatible with iron  deposition disease. No focal suspicious mass lesion within the liver parenchyma. Gallbladder is distended and innumerable tiny 1-3 mm layering gallstones there are evident. The gallbladder wall is thickened and markedly irregular in appearance with associated pericholecystic edema. There is hyperemia in the liver parenchyma adjacent to the  gallbladder fossa. No substantial intrahepatic biliary duct dilatation. The common duct is attenuated by mass effect as it passes adjacent to the gallbladder neck through the hepatoduodenal ligament. Common bile duct in the head of the pancreas is nondilated measuring only 3 mm diameter. No definite MR evidence for choledocholithiasis. Pancreas: No focal mass lesion. No dilatation of the main duct. No intraparenchymal cyst. No peripancreatic edema. Spleen:  No splenomegaly. No suspicious focal mass lesion. Adrenals/Urinary Tract: No adrenal nodule or mass. Kidneys unremarkable. Stomach/Bowel: Stomach is unremarkable. No  gastric wall thickening. No evidence of outlet obstruction. Duodenum is normally positioned as is the ligament of Treitz. No small bowel or colonic dilatation within the visualized abdomen. Vascular/Lymphatic: No abdominal aortic aneurysm. Portal vein, superior mesenteric vein, and splenic vein are patent. There is no gastrohepatic or hepatoduodenal ligament lymphadenopathy. No retroperitoneal or mesenteric lymphadenopathy. Other:  No substantial intraperitoneal free fluid. Musculoskeletal: No focal suspicious marrow enhancement within the visualized bony anatomy. IMPRESSION: 1. Distended gallbladder with innumerable tiny 1-3 mm layering gallstones. The gallbladder wall is thickened and markedly irregular in appearance with associated pericholecystic edema. Hyperemia noted in the subcapsular liver parenchyma adjacent to the gallbladder fossa. Imaging features compatible with acute cholecystitis. 2. While there is no substantial intrahepatic biliary duct dilatation, central biliary anatomy in the region of the biliary duct confluence passes through the hyperemic liver parenchyma. Component of ascending cholangitis in this region cannot be excluded. 3. The common duct appears attenuated by mass effect from the edema/inflammation as it passes adjacent to the gallbladder neck through the hepatoduodenal ligament. Common bile duct in the head of the pancreas is nondilated measuring only 3 mm diameter. No definite MR evidence for choledocholithiasis. 4. Markedly low signal intensity in the liver parenchyma on T2 imaging with paradoxical increased signal intensity on out of phase T1 weighted imaging, features compatible with iron  deposition disease. These results will be called to the ordering clinician or representative by the Radiologist Assistant, and communication documented in the PACS or [employer]. Electronically Signed   By: Donnal Fusi M.D.   On: 11/12/2023 05:17   US  Abdomen Limited RUQ  (LIVER/GB) Result Date: 11/11/2023 CLINICAL DATA:  151471 RUQ pain 151471 EXAM: ULTRASOUND ABDOMEN LIMITED RIGHT UPPER QUADRANT COMPARISON:  None Available. FINDINGS: Gallbladder: No gallstones or wall thickening visualized. No sonographic Murphy sign noted by sonographer. Common bile duct: Diameter: 4 mm Liver: No focal lesion identified. Within normal limits in parenchymal echogenicity. Portal vein is patent on color Doppler imaging with normal direction of blood flow towards the liver. Other: None. IMPRESSION: Cholelithiasis with no acute cholecystitis. Electronically Signed   By: Morgane  Naveau M.D.   On: 11/11/2023 21:17   DG Chest Port 1 View Result Date: 11/11/2023 CLINICAL DATA:  Possible sepsis EXAM: PORTABLE CHEST 1 VIEW COMPARISON:  08/19/2023 FINDINGS: Cardiac shadow is enlarged but stable. Postsurgical changes are again seen. Right jugular dialysis catheter is noted. The lungs are well aerated. Elevation of the right hemidiaphragm is seen. No focal infiltrate or effusion is noted. No bony abnormality is seen. IMPRESSION: No active disease. Electronically Signed   By: Violeta Grey M.D.   On: 11/11/2023 20:50    Anti-infectives: Anti-infectives (From admission, onward)    Start     Dose/Rate Route Frequency Ordered Stop   11/12/23 0200  piperacillin -tazobactam (ZOSYN ) IVPB 3.375 g       Placed in Followed by Linked Group   3.375 g 12.5 mL/hr over 240 Minutes Intravenous Every 8 hours 11/11/23 1959  11/11/23 2000  piperacillin -tazobactam (ZOSYN ) IVPB 3.375 g  Status:  Discontinued        3.375 g 100 mL/hr over 30 Minutes Intravenous Every 6 hours 11/11/23 1948 11/11/23 1948   11/11/23 2000  piperacillin -tazobactam (ZOSYN ) IVPB 3.375 g       Placed in Followed by Linked Group   3.375 g 100 mL/hr over 30 Minutes Intravenous  Once 11/11/23 1959 11/11/23 2056        Assessment/Plan Cholecystitis with elevated LFT's - RUQ US  w/ Cholelithiasis with no acute cholecystitis, CBD  4mm.  - MRCP w/ distended gallbladder w/ cholelithiasis with gallbladder wall thickening and pericholecystic edema. No substantial intrahepatic biliary duct dilatation. The common duct appears attenuated by mass effect from the edema/inflammation as it passes adjacent to the gallbladder neck through the hepatoduodenal ligament. Common bile duct in the head of the pancreas is nondilated measuring only 3 mm diameter. No definite MR evidence for choledocholithiasis. - Presented with fever to 102.2, tachycardia and soft BP. Fever, tachycardia and soft BP have resolved. LA 2 --> 1.7. WBC 12.1 --> 8.1. Plt 76.  - LFT's remain elevated with Alk Phos 134 (158), T. Bili 4.8 (4.1). AST 49 (54) and ALT 42 (51).  - Cont abx - Given patients extensive PMhx, recommend perc cholecystostomy tube by IR. Will order. Discussed post perc chole care with the patient including this would be in place for at least 6 weeks but could stay in place longer.  - Continue to trend LFT's. GI on board to evaluate given concern for possible choledocholithiasis/Cholangitis on presentation. MRI reviewed.  - We will follow with you   FEN - NPO for IR consult. IVF per TRH VTE - SCDs, on hold for IR consult.  ID - Zosyn   ESRD on HD TTS Severe aortic stenosis scheduled for upcoming TAVR on 11/18/2023 HFrEF - Last echo done in November 2023 showing EF 35 to 40% and grade 3 diastolic dysfunction  CAD status post CABG in 2003 - noted results of heart cath in Feb 2025 (Widely patent LIMA to LAD, Severe native vessel disease, 3 vein grafts from the aorta are all occluded) type 2 diabetes Hypertension Hyperlipidemia PAD Hypothyroidism Pulmonary hypertension   I reviewed nursing notes, hospitalist notes, last 24 h vitals and pain scores, last 48 h intake and output, last 24 h labs and trends, and last 24 h imaging results.    LOS: 0 days    Phillip [name], [facility] Surgery 11/12/2023, 7:57 AM Please see Amion for  pager number during day hours 7:00am-4:30pm

## 2023-11-12 NOTE — Consult Note (Signed)
 Reason for Consult/Chief Complaint: gallstones Consultant: Drury Geralds, MD  Phillip [name] is an 68 y.o. male.   HPI: 11M with RUQ abdominal pain x41m, worsened this past weekend. Also had n/v, most recently 6/7. Last BM 6/8. Prior lap appy 2019. TTS HD via LUE AVF. First time HD via LUE AVF 6/10, also has a R internal jugular catheter in place. Was directed to seek emergency care by HD staff, he is unclear why. Scheduled for TAVR 6/17.   Past Medical History:  Diagnosis Date   Abdominopelvic abscess (HCC) 09/29/2018   Abscess of appendix 09/22/2018   Acquired spondylolisthesis of lumbosacral region 03/20/2020   Anemia of chronic disease 08/18/2019   Aortic atherosclerosis (HCC)    Aortic stenosis    a.) TTE 09/15/2018: mild-mod (MPG 19); b.) TTE 03/21/2021: mild-mod (MPG 14.3); c.) TTE 04/24/2022: mild- mod (MPG 15)   Appendicitis 09/08/2018   Asthma    Benign hypertension with CKD (chronic kidney disease) stage IV (HCC) 02/24/2018   Benign prostatic hyperplasia without lower urinary tract symptoms 01/14/2018   Bilateral lower extremity edema 11/04/2018   Bradycardia 03/25/2018   Bruit of right carotid artery 07/26/2015   CAD (coronary artery disease) 2003   a.) s/p 4v CABG 2003   Cardiac murmur 07/26/2015   Chronic combined systolic and diastolic CHF (congestive heart failure) (HCC) 07/26/2015   a.) TTE 09/15/2018: EF 50-55%, mod LVH, RVE, BAE, mild-mod TR, AoV sclerosis, G1DD; b.) MPI 10/27/2019: EF <30%; c.) TTE 03/21/2021: EF 35-40%, post AK, inf HK, mod LVH, mod red RVSF, mod LAE, mod Aov sclerosis, G2DD; c.) TTE 04/24/2022: EF 35-40%, post AK, glok HK, mod LVE, mod red RVSF, mild-mod MR, Aov sclerosis, G3DD   Chronic idiopathic constipation 11/02/2018   Chronic low back pain without sciatica 12/02/2018   Chronic pain of right hip 03/31/2020   CKD (chronic kidney disease) stage 4, GFR 15-29 ml/min (HCC) 10/07/2018   Coronary artery disease    Dysphagia 07/26/2015   Erectile  dysfunction 07/14/2017   Essential hypertension 02/24/2018   Foot ulcer, left (HCC)    GERD (gastroesophageal reflux disease)    Hematuria 07/27/2015   History of marijuana use    Hyperlipidemia 07/26/2015   Hypertension    Hypothyroidism 11/14/2015   Insomnia 11/02/2018   Lumbar spondylosis 03/20/2020   Malaise and fatigue 03/25/2018   Myocardial infarction due to demand ischemia (HCC) 09/26/2020   a.) Type II NSTEMI; b.) troponins were trended 0.54 --> 0.56 --> 0.52 ng/mL   Non-compliance 05/05/2019   Osseous and subluxation stenosis of intervertebral foramina of lumbar region 03/20/2020   Osteoarthritis 10/06/2019   Peripheral vascular disease (HCC)    Personal history of tobacco use, presenting hazards to health 09/27/2020   Polyneuropathy in diabetes (HCC) 05/07/2019   Pulmonary HTN (HCC)    a.) TTE 05/12/2019: PASP 71; b.) TTE 09/27/2020: PASP >70; c.) TTE 03/21/2021: RVSP 43; d.) TTE 04/24/2022: RVSP 80.6   Puncture wound of right hip 04/02/2018   PVD (peripheral vascular disease) with claudication (HCC)    a.) s/p PTA 03/02/2018 - balloon angioplasty LEFT below knee popliteal artery; b.) s/p PTA 09/30/2022: baloon angioplasty LEFT tibioperoneal trunck, most proximal peroneal artery, and LEFT popliteal artery.   S/P CABG x 4 2003   Sleep apnea 07/26/2015   a.) does not require nocturnal PAP therapy   Subacute osteomyelitis of left foot (HCC) 03/25/2018   Thrombocytopenia (HCC)    Type 2 diabetes mellitus with stage 4 chronic kidney disease, with  long-term current use of insulin  (HCC) 07/26/2015   Vitamin B12 deficiency 06/16/2019   Vitamin D deficiency 05/07/2018    Past Surgical History:  Procedure Laterality Date   AMPUTATION TOE Left 03/13/2018   Procedure: AMPUTATION TOE-MPJ;  Surgeon: Anell Baptist, DPM;  Location: [facility] ORS;  Service: Podiatry;  Laterality: Left;   ANGIOPLASTY     AV FISTULA PLACEMENT Left 08/20/2023   Procedure: ARTERIOVENOUS (AV) FISTULA  CREATION;  Surgeon: Young Hensen, MD;  Location: [facility] OR;  Service: Vascular;  Laterality: Left;   CARDIAC CATHETERIZATION     CORONARY ARTERY BYPASS GRAFT N/A 2003   IR FLUORO GUIDE CV LINE RIGHT  08/18/2023   IR US  GUIDE VASC ACCESS RIGHT  08/18/2023   LAPAROSCOPIC APPENDECTOMY N/A 09/08/2018   Procedure: APPENDECTOMY LAPAROSCOPIC;  Surgeon: Alben Alma, MD;  Location: [facility] ORS;  Service: General;  Laterality: N/A;   LOWER EXTREMITY ANGIOGRAPHY Left 03/02/2018   Procedure: LOWER EXTREMITY ANGIOGRAPHY;  Surgeon: Celso College, MD;  Location: [facility] INVASIVE CV LAB;  Service: Cardiovascular;  Laterality: Left;   LOWER EXTREMITY ANGIOGRAPHY Left 09/30/2022   Procedure: Lower Extremity Angiography;  Surgeon: Celso College, MD;  Location: [facility] INVASIVE CV LAB;  Service: Cardiovascular;  Laterality: Left;   RIGHT/LEFT HEART CATH AND CORONARY/GRAFT ANGIOGRAPHY N/A 08/01/2023   Procedure: RIGHT/LEFT HEART CATH AND CORONARY/GRAFT ANGIOGRAPHY;  Surgeon: Kyra Phy, MD;  Location: [facility] INVASIVE CV LAB;  Service: Cardiovascular;  Laterality: N/A;   ROTATOR CUFF REPAIR Left     Family History  Problem Relation Age of Onset   Hyperlipidemia Mother    Hypertension Mother    Diabetes Mother    Heart attack Brother    Heart disease Brother    Lung disease Father     Social History:  reports that he quit smoking about 22 years ago. His smoking use included cigarettes. He started smoking about 47 years ago. He has a 12.5 pack-year smoking history. He has never used smokeless tobacco. He reports that he does not currently use alcohol. He reports that he does not currently use drugs after having used the following drugs: Marijuana.  Allergies: No Known Allergies  Medications: I have reviewed the patient's current medications.  Results for orders placed or performed during the hospital encounter of 11/11/23 (from the past 48 hours)  Comprehensive metabolic panel     Status: Abnormal    Collection Time: 11/11/23  7:30 PM  Result Value Ref Range   Sodium 134 (L) 135 - 145 mmol/L   Potassium 3.8 3.5 - 5.1 mmol/L   Chloride 94 (L) 98 - 111 mmol/L   CO2 25 22 - 32 mmol/L   Glucose, Bld 148 (H) 70 - 99 mg/dL    Comment: Glucose reference range applies only to samples taken after fasting for at least 8 hours.   BUN 31 (H) 8 - 23 mg/dL   Creatinine, Ser 1.61 (H) 0.61 - 1.24 mg/dL   Calcium  8.1 (L) 8.9 - 10.3 mg/dL   Total Protein 8.0 6.5 - 8.1 g/dL   Albumin  3.2 (L) 3.5 - 5.0 g/dL   AST 54 (H) 15 - 41 U/L   ALT 51 (H) 0 - 44 U/L   Alkaline Phosphatase 158 (H) 38 - 126 U/L   Total Bilirubin 4.1 (H) 0.0 - 1.2 mg/dL   GFR, Estimated 12 (L) >60 mL/min    Comment: (NOTE) Calculated using the CKD-EPI Creatinine Equation (2021)    Anion gap 15 5 - 15  Comment: Performed at [facility], [address], [place], [place] [address]  CBC with Differential     Status: Abnormal   Collection Time: 11/11/23  7:30 PM  Result Value Ref Range   WBC 12.1 (H) 4.0 - 10.5 K/uL   RBC 4.12 (L) 4.22 - 5.81 MIL/uL   Hemoglobin 12.8 (L) 13.0 - 17.0 g/dL   HCT 60.4 (L) 54.0 - 98.1 %   MCV 94.2 80.0 - 100.0 fL   MCH 31.1 26.0 - 34.0 pg   MCHC 33.0 30.0 - 36.0 g/dL   RDW 19.1 47.8 - 29.5 %   Platelets 81 (L) 150 - 400 K/uL    Comment: SPECIMEN CHECKED FOR CLOTS Immature Platelet Fraction may be clinically indicated, consider ordering this additional test [id number] REPEATED TO VERIFY    nRBC 0.0 0.0 - 0.2 %   Neutrophils Relative % 87 %   Neutro Abs 10.4 (H) 1.7 - 7.7 K/uL   Lymphocytes Relative 5 %   Lymphs Abs 0.7 0.7 - 4.0 K/uL   Monocytes Relative 7 %   Monocytes Absolute 0.8 0.1 - 1.0 K/uL   Eosinophils Relative 0 %   Eosinophils Absolute 0.0 0.0 - 0.5 K/uL   Basophils Relative 0 %   Basophils Absolute 0.0 0.0 - 0.1 K/uL   Immature Granulocytes 1 %   Abs Immature Granulocytes 0.10 (H) 0.00 - 0.07 K/uL    Comment: Performed at [facility], [address]  [place], [place] [address]  Protime-INR     Status: Abnormal   Collection Time: 11/11/23  7:30 PM  Result Value Ref Range   Prothrombin Time 17.1 (H) 11.4 - 15.2 seconds   INR 1.4 (H) 0.8 - 1.2    Comment: (NOTE) INR goal varies based on device and disease states. Performed at [facility], [address], [place], [place] [address]   Lipase, blood     Status: None   Collection Time: 11/11/23  7:30 PM  Result Value Ref Range   Lipase 45 11 - 51 U/L    Comment: Performed at [facility], [address] [place], [place] [address]  I-Stat Lactic Acid, ED     Status: Abnormal   Collection Time: 11/11/23  7:40 PM  Result Value Ref Range   Lactic Acid, Venous 2.0 (HH) 0.5 - 1.9 mmol/L   Comment NOTIFIED PHYSICIAN   Resp panel by RT-PCR (RSV, Flu A&B, Covid) Anterior Nasal Swab     Status: None   Collection Time: 11/11/23  8:22 PM   Specimen: Anterior Nasal Swab  Result Value Ref Range   SARS Coronavirus 2 by RT PCR NEGATIVE NEGATIVE   Influenza A by PCR NEGATIVE NEGATIVE   Influenza B by PCR NEGATIVE NEGATIVE    Comment: (NOTE) The Xpert Xpress SARS-CoV-2/FLU/RSV plus assay is intended as an aid in the diagnosis of influenza from Nasopharyngeal swab specimens and should not be used as a sole basis for treatment. Nasal washings and aspirates are unacceptable for Xpert Xpress SARS-CoV-2/FLU/RSV testing.  Fact Sheet for Patients: BloggerCourse.com  Fact Sheet for Healthcare Providers: SeriousBroker.it  This test is not yet approved or cleared by the [place]  FDA and has been authorized for detection and/or diagnosis of SARS-CoV-2 by FDA under an Emergency Use Authorization (EUA). This EUA will remain in effect (meaning this test can be used) for the duration of the COVID-19 declaration under Section 564(b)(1) of the Act, 21 U.S.C. section 360bbb-3(b)(1), unless the  authorization is terminated or revoked.     Resp  Syncytial Virus by PCR NEGATIVE NEGATIVE    Comment: (NOTE) Fact Sheet for Patients: BloggerCourse.com  Fact Sheet for Healthcare Providers: SeriousBroker.it  This test is not yet approved or cleared by the [place]  FDA and has been authorized for detection and/or diagnosis of SARS-CoV-2 by FDA under an Emergency Use Authorization (EUA). This EUA will remain in effect (meaning this test can be used) for the duration of the COVID-19 declaration under Section 564(b)(1) of the Act, 21 U.S.C. section 360bbb-3(b)(1), unless the authorization is terminated or revoked.  Performed at [facility], [address], [place], [place] [address]   I-Stat Lactic Acid, ED     Status: None   Collection Time: 11/11/23  9:52 PM  Result Value Ref Range   Lactic Acid, Venous 1.7 0.5 - 1.9 mmol/L    US  Abdomen Limited RUQ (LIVER/GB) Result Date: 11/11/2023 CLINICAL DATA:  151471 RUQ pain 151471 EXAM: ULTRASOUND ABDOMEN LIMITED RIGHT UPPER QUADRANT COMPARISON:  None Available. FINDINGS: Gallbladder: No gallstones or wall thickening visualized. No sonographic Murphy sign noted by sonographer. Common bile duct: Diameter: 4 mm Liver: No focal lesion identified. Within normal limits in parenchymal echogenicity. Portal vein is patent on color Doppler imaging with normal direction of blood flow towards the liver. Other: None. IMPRESSION: Cholelithiasis with no acute cholecystitis. Electronically Signed   By: Morgane  Naveau M.D.   On: 11/11/2023 21:17   DG Chest Port 1 View Result Date: 11/11/2023 CLINICAL DATA:  Possible sepsis EXAM: PORTABLE CHEST 1 VIEW COMPARISON:  08/19/2023 FINDINGS: Cardiac shadow is enlarged but stable. Postsurgical changes are again seen. Right jugular dialysis catheter is noted. The lungs are well aerated. Elevation of the right hemidiaphragm is seen. No focal infiltrate or effusion is noted. No bony abnormality is seen.  IMPRESSION: No active disease. Electronically Signed   By: Violeta Grey M.D.   On: 11/11/2023 20:50    ROS 10 point review of systems is negative except as listed above in HPI.   Physical Exam Blood pressure 121/70, pulse 94, temperature 98.9 F (37.2 C), temperature source Oral, resp. rate 19, height 6' 1 (1.854 m), weight 83.5 kg, SpO2 96%. Constitutional: well-developed, well-nourished HEENT: pupils equal, round, reactive to light, 2mm b/l, moist conjunctiva, external inspection of ears and nose normal, hearing intact Oropharynx: normal oropharyngeal mucosa, poor dentition Neck: no thyromegaly, trachea midline, no midline cervical tenderness to palpation Chest: breath sounds equal bilaterally, normal respiratory effort, no midline or lateral chest wall tenderness to palpation/deformity, evidence of prior sternotomy  Abdomen: soft, RUQ TTP, no bruising, no hepatosplenomegaly Skin: warm, dry, no rashes Psych: normal memory, normal mood/affect     Assessment/Plan: Cholelithiasis, hyperbilirubinemia - I was consulted for evaluation for laparoscopic cholecystectomy. I recommended medicine admission, GI consultation, and empiric antibiotics given Tbili of 4.1, leukocytosis, and fever+tachycardia on admission. My recommendation is due to my concern for choledocholithiasis and possible cholangitis. I believe addressing his sepsis is more pressing than lap chole, especially given that US  does not appear concerning for cholecystitis. MRCP would be the most appropriate next imaging modality after resuscitation. Of note, patient is scheduled for TAVR in the next few days. Based on MRCP results, may benefit from ERCP. Will defer decision-making to cardiology and GI regarding whether TAVR pre-ERCP and/or this hospitalization is appropriate, given concern for sepsis. Risk of anesthetic complication in the setting of AS and need for abdominal insufflation will need to be discussed further  if decision for lap  chole without performing TAVR. Detailed explanation held with the patient. FEN - per primary, okay for diet from surgery standpoint, but consider NPO status for GI needs Dispo - per primary    Anda Bamberg, MD General and Trauma Surgery [facility] Surgery

## 2023-11-12 NOTE — Progress Notes (Signed)
 Courtesy visit: No billing-  Patient is seen and examined today morning. He is admitted for acute cholecystitis unlikely choledocholithiasis. General surgery, GI consulted. He has RUQ pain, tenderness. Fever better.  Continue IV Zosyn  therapy. High risk patient for cholecystectomy given multiple cardiac issues. Home meds on hold as he is NPO. General surgery consulted IR for cholecystostomy tube placement. Continue pain control, IV hydration. Further management pending clinical course.

## 2023-11-12 NOTE — Progress Notes (Signed)
 MRCP results noted with suspicion of cholecystitis and possible cholangitis. No intra-hepatic ductal dilatation noted and CBD appears attenuated due to mass effect from adjacent edema. Based on this additional imaging, I suspect his clinical presentation of hyperbilirubinemia and possible cholangitis may actually be a result of a Mirizzi type effect and less likely due to choledocholithiasis. Recommend continuation of abx. Without intra-hepatic ductal dilation, I am doubtful that decompression via biliary drainage will be successful/effective. He may be a better candidate for cholecystostomy tube placement as opposed to lap chole given his cardiac concerns. Will defer to the day surgical team to continue further discussions with IR and cardiology regarding risk/benefit analysis.   Anda Bamberg, MD General and Trauma Surgery [facility]

## 2023-11-12 NOTE — Consult Note (Addendum)
 Consultation  Referring Provider:  Dr. Butch Cashing     Primary Care Physician:  Abbe Hoard., MD Primary Gastroenterologist: Para Bold         Reason for Consultation:  Acute Cholecystitis            HPI:   Phillip [name] is a 68 y.o. male with a past medical history as listed below including ESRD on HD, severe aortic stenosis scheduled for upcoming TAVR on 11/18/2023, heart failure with reduced ejection fraction, CAD status post CABG in 2003, type 2 diabetes, hypertension, PAD, hypothyroidism, pulmonary hypertension and history of laparoscopic appendectomy who presents to the ED for evaluation of right upper quadrant abdominal pain with nausea, vomiting and fever noted at dialysis.    Patient initially presented with right upper quadrant abdominal pain for the past month which had increased over the weekend and then started with nausea and vomiting.  He then was noted to have a fever during dialysis yesterday so came into the hospital.    At time my interview patient tells me he has been having some right upper quadrant pain but it has been off-and-on over the past month, may be somewhat related to eating.  Over the weekend this worsened to the point where he felt like he may need to come to the hospital, then at dialysis noted to have a fever of 102.  He did have an episode of vomiting, the last yesterday.  His last bowel movement was Sunday but he still passing gas.    Discusses that he is supposed to have a TAVR next week.    Denies weight loss or change in bowel habits.  GI history: None  Past Medical History:  Diagnosis Date   Abdominopelvic abscess (HCC) 09/29/2018   Abscess of appendix 09/22/2018   Acquired spondylolisthesis of lumbosacral region 03/20/2020   Anemia of chronic disease 08/18/2019   Aortic atherosclerosis (HCC)    Aortic stenosis    a.) TTE 09/15/2018: mild-mod (MPG 19); b.) TTE 03/21/2021: mild-mod (MPG 14.3); c.) TTE 04/24/2022: mild- mod (MPG 15)    Appendicitis 09/08/2018   Asthma    Benign hypertension with CKD (chronic kidney disease) stage IV (HCC) 02/24/2018   Benign prostatic hyperplasia without lower urinary tract symptoms 01/14/2018   Bilateral lower extremity edema 11/04/2018   Bradycardia 03/25/2018   Bruit of right carotid artery 07/26/2015   CAD (coronary artery disease) 2003   a.) s/p 4v CABG 2003   Cardiac murmur 07/26/2015   Chronic combined systolic and diastolic CHF (congestive heart failure) (HCC) 07/26/2015   a.) TTE 09/15/2018: EF 50-55%, mod LVH, RVE, BAE, mild-mod TR, AoV sclerosis, G1DD; b.) MPI 10/27/2019: EF <30%; c.) TTE 03/21/2021: EF 35-40%, post AK, inf HK, mod LVH, mod red RVSF, mod LAE, mod Aov sclerosis, G2DD; c.) TTE 04/24/2022: EF 35-40%, post AK, glok HK, mod LVE, mod red RVSF, mild-mod MR, Aov sclerosis, G3DD   Chronic idiopathic constipation 11/02/2018   Chronic low back pain without sciatica 12/02/2018   Chronic pain of right hip 03/31/2020   CKD (chronic kidney disease) stage 4, GFR 15-29 ml/min (HCC) 10/07/2018   Coronary artery disease    Dysphagia 07/26/2015   Erectile dysfunction 07/14/2017   Essential hypertension 02/24/2018   Foot ulcer, left (HCC)    GERD (gastroesophageal reflux disease)    Hematuria 07/27/2015   History of marijuana use    Hyperlipidemia 07/26/2015   Hypertension    Hypothyroidism 11/14/2015   Insomnia 11/02/2018  Lumbar spondylosis 03/20/2020   Malaise and fatigue 03/25/2018   Myocardial infarction due to demand ischemia (HCC) 09/26/2020   a.) Type II NSTEMI; b.) troponins were trended 0.54 --> 0.56 --> 0.52 ng/mL   Non-compliance 05/05/2019   Osseous and subluxation stenosis of intervertebral foramina of lumbar region 03/20/2020   Osteoarthritis 10/06/2019   Peripheral vascular disease (HCC)    Personal history of tobacco use, presenting hazards to health 09/27/2020   Polyneuropathy in diabetes (HCC) 05/07/2019   Pulmonary HTN (HCC)    a.) TTE 05/12/2019:  PASP 71; b.) TTE 09/27/2020: PASP >70; c.) TTE 03/21/2021: RVSP 43; d.) TTE 04/24/2022: RVSP 80.6   Puncture wound of right hip 04/02/2018   PVD (peripheral vascular disease) with claudication (HCC)    a.) s/p PTA 03/02/2018 - balloon angioplasty LEFT below knee popliteal artery; b.) s/p PTA 09/30/2022: baloon angioplasty LEFT tibioperoneal trunck, most proximal peroneal artery, and LEFT popliteal artery.   S/P CABG x 4 2003   Sleep apnea 07/26/2015   a.) does not require nocturnal PAP therapy   Subacute osteomyelitis of left foot (HCC) 03/25/2018   Thrombocytopenia (HCC)    Type 2 diabetes mellitus with stage 4 chronic kidney disease, with long-term current use of insulin  (HCC) 07/26/2015   Vitamin B12 deficiency 06/16/2019   Vitamin D deficiency 05/07/2018    Past Surgical History:  Procedure Laterality Date   AMPUTATION TOE Left 03/13/2018   Procedure: AMPUTATION TOE-MPJ;  Surgeon: Anell Baptist, DPM;  Location: [facility] ORS;  Service: Podiatry;  Laterality: Left;   ANGIOPLASTY     AV FISTULA PLACEMENT Left 08/20/2023   Procedure: ARTERIOVENOUS (AV) FISTULA CREATION;  Surgeon: Young Hensen, MD;  Location: [facility] OR;  Service: Vascular;  Laterality: Left;   CARDIAC CATHETERIZATION     CORONARY ARTERY BYPASS GRAFT N/A 2003   IR FLUORO GUIDE CV LINE RIGHT  08/18/2023   IR US  GUIDE VASC ACCESS RIGHT  08/18/2023   LAPAROSCOPIC APPENDECTOMY N/A 09/08/2018   Procedure: APPENDECTOMY LAPAROSCOPIC;  Surgeon: Alben Alma, MD;  Location: [facility] ORS;  Service: General;  Laterality: N/A;   LOWER EXTREMITY ANGIOGRAPHY Left 03/02/2018   Procedure: LOWER EXTREMITY ANGIOGRAPHY;  Surgeon: Celso College, MD;  Location: [facility];  Service: Cardiovascular;  Laterality: Left;   LOWER EXTREMITY ANGIOGRAPHY Left 09/30/2022   Procedure: Lower Extremity Angiography;  Surgeon: Celso College, MD;  Location: [facility];  Service: Cardiovascular;  Laterality: Left;   RIGHT/LEFT HEART CATH  AND CORONARY/GRAFT ANGIOGRAPHY N/A 08/01/2023   Procedure: RIGHT/LEFT HEART CATH AND CORONARY/GRAFT ANGIOGRAPHY;  Surgeon: Kyra Phy, MD;  Location: [facility];  Service: Cardiovascular;  Laterality: N/A;   ROTATOR CUFF REPAIR Left     Family History  Problem Relation Age of Onset   Hyperlipidemia Mother    Hypertension Mother    Diabetes Mother    Heart attack Brother    Heart disease Brother    Lung disease Father     Social History   Tobacco Use   Smoking status: Former    Current packs/day: 0.00    Average packs/day: 0.5 packs/day for 25.0 years (12.5 ttl pk-yrs)    Types: Cigarettes    Start date: 3    Quit date: 2003    Years since quitting: 22.4   Smokeless tobacco: Never  Vaping Use   Vaping status: Never Used  Substance Use Topics   Alcohol use: Not Currently    Comment: 2003   Drug use:  Not Currently    Types: Marijuana    Prior to Admission medications   Medication Sig Start Date End Date Taking? Authorizing Provider  acetaminophen  (TYLENOL ) 500 MG tablet Take 1,000 mg by mouth every 6 (six) hours as needed for moderate pain or headache.    Yes [provider]  aspirin  EC 81 MG tablet Take 1 tablet (81 mg total) by mouth daily. Swallow whole. 07/25/23  Yes Thukkani, Arun K, MD  atorvastatin  (LIPITOR) 20 MG tablet Take 1 tablet (20 mg total) by mouth daily. 07/25/23  Yes Thukkani, Arun K, MD  carvedilol  (COREG ) 3.125 MG tablet Take 1 tablet (3.125 mg total) by mouth 2 (two) times daily. 05/20/23 05/02/24 Yes Terrance Ferretti, NP  gabapentin  (NEURONTIN ) 300 MG capsule Take 1 capsule (300 mg total) by mouth at bedtime. Patient taking differently: Take 300 mg by mouth 2 (two) times daily. 08/25/23  Yes Arrien, Mauricio Daniel, MD  insulin  glargine (LANTUS ) 100 UNIT/ML injection Inject 7 Units into the skin daily as needed (Diabetes). Inject 7 units at bedtime as needed after checking glucose   Yes [provider]  isosorbide   mononitrate (IMDUR ) 120 MG 24 hr tablet Take 1 tablet (120 mg total) by mouth daily. 08/27/23  Yes Revankar, Micael Adas, MD  latanoprost  (XALATAN ) 0.005 % ophthalmic solution Place 1 drop into both eyes at bedtime. 04/15/23  Yes [provider]  levothyroxine  (SYNTHROID ) 50 MCG tablet Take 50 mcg by mouth daily. 04/05/23  Yes [provider]  nitroGLYCERIN  (NITROSTAT ) 0.4 MG SL tablet Place 1 tablet (0.4 mg total) under the tongue every 5 (five) minutes as needed for chest pain. 11/20/20  Yes Revankar, Micael Adas, MD  tamsulosin  (FLOMAX ) 0.4 MG CAPS capsule Take 0.4 mg by mouth daily after breakfast. 04/19/20  Yes [provider]  vitamin B-12 (CYANOCOBALAMIN ) 500 MCG tablet Take 500 mcg by mouth daily.    Yes [provider]  Vitamin D, Ergocalciferol, (DRISDOL) 1.25 MG (50000 UNIT) CAPS capsule Take 50,000 Units by mouth once a week. Mondays 07/13/21  Yes [provider]    Current Facility-Administered Medications  Medication Dose Route Frequency Provider Last Rate Last Admin   acetaminophen  (TYLENOL ) tablet 650 mg  650 mg Oral Q6H PRN Juliette Oh, MD       Or   acetaminophen  (TYLENOL ) suppository 650 mg  650 mg Rectal Q6H PRN Juliette Oh, MD       Chlorhexidine  Gluconate Cloth 2 % PADS 6 each  6 each Topical Daily Juliette Oh, MD       hydrALAZINE  (APRESOLINE ) injection 5 mg  5 mg Intravenous Q6H PRN Juliette Oh, MD       HYDROmorphone  (DILAUDID ) injection 0.5 mg  0.5 mg Intravenous Q4H PRN Juliette Oh, MD       insulin  aspart (novoLOG ) injection 0-6 Units  0-6 Units Subcutaneous Q4H Juliette Oh, MD       naloxone (NARCAN) injection 0.4 mg  0.4 mg Intravenous PRN Juliette Oh, MD       piperacillin -tazobactam (ZOSYN ) IVPB 3.375 g  3.375 g Intravenous Q8H Rathore, Vasundhra, MD 12.5 mL/hr at 11/12/23 0312 3.375 g at 11/12/23 5784    Allergies as of 11/11/2023   (No Known Allergies)     Review of Systems:     Constitutional: No weight loss Skin: No rash Cardiovascular: No chest pain Respiratory: No SOB  Gastrointestinal: See HPI and otherwise negative Genitourinary: No dysuria Neurological: No headache, dizziness or syncope Musculoskeletal: No new muscle or  joint pain Hematologic: No bleeding Psychiatric: No history of depression or anxiety    Physical Exam:  Vital signs in last 24 hours: Temp:  [98.4 F (36.9 C)-102.2 F (39 C)] 98.4 F (36.9 C) (06/11 0843) Pulse Rate:  [85-108] 88 (06/11 0843) Resp:  [15-21] 17 (06/11 0843) BP: (97-123)/(57-77) 115/77 (06/11 0843) SpO2:  [94 %-100 %] 97 % (06/11 0843) Weight:  [83.5 kg-84 kg] 84 kg (06/11 0122) Last BM Date : 11/09/23 General:   Pleasant AA male appears to be in NAD, Well developed, Well nourished, alert and cooperative Head:  Normocephalic and atraumatic. Eyes:   PEERL, EOMI. No icterus. Conjunctiva pink. Ears:  Normal auditory acuity. Neck:  Supple Throat: Oral cavity and pharynx without inflammation, swelling or lesion. Teeth in good condition. Lungs: Respirations even and unlabored. Lungs clear to auscultation bilaterally.   No wheezes, crackles, or rhonchi.  Heart: Normal S1, S2.+murmur. Regular rate and rhythm. No peripheral edema, cyanosis or pallor.  Abdomen:  Soft, nondistended, marked right upper quadrant TTP with involuntary guarding.  Normal bowel sounds. No appreciable masses or hepatomegaly. Rectal:  Not performed.  Msk:  Symmetrical without gross deformities. Peripheral pulses intact.  Extremities:  Without edema, no deformity or joint abnormality. Normal ROM, normal sensation. Neurologic:  Alert and  oriented x4;  grossly normal neurologically. Skin:   Dry and intact without significant lesions or rashes. Psychiatric: Demonstrates good judgement and reason without abnormal affect or behaviors.   LAB RESULTS: Recent Labs    11/11/23 1930 11/12/23 0631  WBC 12.1* 8.1  HGB 12.8* 10.8*  HCT 38.8* 32.9*   PLT 81* 76*   BMET Recent Labs    11/11/23 1930 11/12/23 0631  NA 134* 134*  K 3.8 3.7  CL 94* 96*  CO2 25 24  GLUCOSE 148* 85  BUN 31* 35*  CREATININE 4.93* 5.52*  CALCIUM  8.1* 7.8*   LFT Recent Labs    11/12/23 0631  PROT 7.0  ALBUMIN  2.7*  AST 49*  ALT 42  ALKPHOS 134*  BILITOT 4.8*   PT/INR Recent Labs    11/11/23 1930  LABPROT 17.1*  INR 1.4*    STUDIES: MR ABDOMEN MRCP W WO CONTAST Result Date: 11/12/2023 CLINICAL DATA:  Cholelithiasis on ultrasound. EXAM: MRI ABDOMEN WITHOUT AND WITH CONTRAST (INCLUDING MRCP) TECHNIQUE: Multiplanar multisequence MR imaging of the abdomen was performed both before and after the administration of intravenous contrast. Heavily T2-weighted images of the biliary and pancreatic ducts were obtained, and three-dimensional MRCP images were rendered by post processing. CONTRAST:  8mL GADAVIST GADOBUTROL 1 MMOL/ML IV SOLN COMPARISON:  Ultrasound exam from 1 day prior. CTA abdomen/pelvis 10/10/2023. FINDINGS: Lower chest: No acute findings. Hepatobiliary: Markedly low signal intensity identified in the liver parenchyma on T2 imaging with paradoxical increased signal intensity on out of phase T1 weighted imaging, features compatible with iron  deposition disease. No focal suspicious mass lesion within the liver parenchyma. Gallbladder is distended and innumerable tiny 1-3 mm layering gallstones there are evident. The gallbladder wall is thickened and markedly irregular in appearance with associated pericholecystic edema. There is hyperemia in the liver parenchyma adjacent to the gallbladder fossa. No substantial intrahepatic biliary duct dilatation. The common duct is attenuated by mass effect as it passes adjacent to the gallbladder neck through the hepatoduodenal ligament. Common bile duct in the head of the pancreas is nondilated measuring only 3 mm diameter. No definite MR evidence for choledocholithiasis. Pancreas: No focal mass lesion. No  dilatation of the main duct. No  intraparenchymal cyst. No peripancreatic edema. Spleen:  No splenomegaly. No suspicious focal mass lesion. Adrenals/Urinary Tract: No adrenal nodule or mass. Kidneys unremarkable. Stomach/Bowel: Stomach is unremarkable. No gastric wall thickening. No evidence of outlet obstruction. Duodenum is normally positioned as is the ligament of Treitz. No small bowel or colonic dilatation within the visualized abdomen. Vascular/Lymphatic: No abdominal aortic aneurysm. Portal vein, superior mesenteric vein, and splenic vein are patent. There is no gastrohepatic or hepatoduodenal ligament lymphadenopathy. No retroperitoneal or mesenteric lymphadenopathy. Other:  No substantial intraperitoneal free fluid. Musculoskeletal: No focal suspicious marrow enhancement within the visualized bony anatomy. IMPRESSION: 1. Distended gallbladder with innumerable tiny 1-3 mm layering gallstones. The gallbladder wall is thickened and markedly irregular in appearance with associated pericholecystic edema. Hyperemia noted in the subcapsular liver parenchyma adjacent to the gallbladder fossa. Imaging features compatible with acute cholecystitis. 2. While there is no substantial intrahepatic biliary duct dilatation, central biliary anatomy in the region of the biliary duct confluence passes through the hyperemic liver parenchyma. Component of ascending cholangitis in this region cannot be excluded. 3. The common duct appears attenuated by mass effect from the edema/inflammation as it passes adjacent to the gallbladder neck through the hepatoduodenal ligament. Common bile duct in the head of the pancreas is nondilated measuring only 3 mm diameter. No definite MR evidence for choledocholithiasis. 4. Markedly low signal intensity in the liver parenchyma on T2 imaging with paradoxical increased signal intensity on out of phase T1 weighted imaging, features compatible with iron  deposition disease. These results will be  called to the ordering clinician or representative by the Radiologist Assistant, and communication documented in the PACS or [employer]. Electronically Signed   By: Donnal Fusi M.D.   On: 11/12/2023 05:17   US  Abdomen Limited RUQ (LIVER/GB) Result Date: 11/11/2023 CLINICAL DATA:  151471 RUQ pain 151471 EXAM: ULTRASOUND ABDOMEN LIMITED RIGHT UPPER QUADRANT COMPARISON:  None Available. FINDINGS: Gallbladder: No gallstones or wall thickening visualized. No sonographic Murphy sign noted by sonographer. Common bile duct: Diameter: 4 mm Liver: No focal lesion identified. Within normal limits in parenchymal echogenicity. Portal vein is patent on color Doppler imaging with normal direction of blood flow towards the liver. Other: None. IMPRESSION: Cholelithiasis with no acute cholecystitis. Electronically Signed   By: Morgane  Naveau M.D.   On: 11/11/2023 21:17   DG Chest Port 1 View Result Date: 11/11/2023 CLINICAL DATA:  Possible sepsis EXAM: PORTABLE CHEST 1 VIEW COMPARISON:  08/19/2023 FINDINGS: Cardiac shadow is enlarged but stable. Postsurgical changes are again seen. Right jugular dialysis catheter is noted. The lungs are well aerated. Elevation of the right hemidiaphragm is seen. No focal infiltrate or effusion is noted. No bony abnormality is seen. IMPRESSION: No active disease. Electronically Signed   By: Violeta Grey M.D.   On: 11/11/2023 20:50    Impression / Plan:   Impression: 1.  Acute cholecystitis: With sepsis and likely ascending cholangitis, MRCP yesterday with no CBD stone and no sign of choledocholithiasis, there is some mass effect from inflamed gallbladder-Mirizzi-like syndrome which should get better once patient is status post cholecystectomy or cholecystostomy 2.  ESRD on HD 3.  Thrombocytopenia: In setting of ESRD 4.  Severe aortic stenosis: Scheduled for upcoming TAVR on 11/18/2023 5.  Chronic heart failure with reduced ejection fraction: Echo November 2023 showing EF  35-40% 6.  CAD status post CABG in 2003 7.  Type 2 diabetes 8.  Severe pulmonary hypertension  Plan: 1.  Consulted our hepatobiliary specialist Dr. Venice Gillis with his recommendations  as below:  MRCP reviewed. Has acute cholecystitis. No choledocholithiasis. CBD - small and normal. No IHBR dilatation. Some mass effect from inflamed GB- Mirrizzi like syndrome. Should get better once cholecystectomy or cholecystostomy is done. No need for ERCP at this time  2.  Plans per surgical team at this point. 3.  We will sign off.  Please call us  back if we can be of any further assistance.  Thank you for your kind consultation.   Kathy Parker [facility]  11/12/2023, 9:55 AM   Attending physician's note  I personally saw the patient and performed a substantive portion of this encounter (>50% time spent), including a complete performance of at least one of the key components (MDM, Hx and/or Exam), in conjunction with the APP.  I agree with the APP's note, impression, and recommendations with additional input as follows.    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  No plan for ERCP.  Reviewed and discussed case with Dr. Venice Gillis, ERCP backup Continue antibiotics and supportive care Management of acute cholecystitis per surgery  GI signing off, please call us  back if needed  Family at bedside and the patient were provided an opportunity to ask questions and all were answered. They agreed with the plan and demonstrated an understanding of the instructions.  Lorena Rolling , MD [phone]

## 2023-11-12 NOTE — Plan of Care (Signed)
   Problem: Education: Goal: Knowledge of General Education information will improve Description: Including pain rating scale, medication(s)/side effects and non-pharmacologic comfort measures Outcome: Progressing   Problem: Clinical Measurements: Goal: Respiratory complications will improve Outcome: Progressing   Problem: Nutrition: Goal: Adequate nutrition will be maintained Outcome: Progressing

## 2023-11-12 NOTE — TOC Initial Note (Signed)
 Transition of Care (TOC) - Initial/Assessment Note   Spoke to patient at bedside.   Patient from home with wife.   Patient has a cane    Transition of Care Department (TOC) has reviewed patient and no TOC needs have been identified at this time. We will continue to monitor patient advancement through interdisciplinary progression rounds. If new patient transition needs arise, please place a TOC consult.   Patient Details  Name: Phillip [name] MRN: [id number] Date of Birth: [birth year]  Transition of Care [facility]) CM/SW Contact:    Terre Ferri, RN Phone Number: 11/12/2023, 8:54 AM  Clinical Narrative:                   Expected Discharge Plan: Home/Self Care Barriers to Discharge: Continued Medical Work up   Patient Goals and CMS Choice Patient states their goals for this hospitalization and ongoing recovery are:: to return to home   Choice offered to / list presented to : NA      Expected Discharge Plan and Services   Discharge Planning Services: CM Consult Post Acute Care Choice: NA Living arrangements for the past 2 months: Single Family Home                 DME Arranged: N/A DME Agency: NA       HH Arranged: NA HH Agency: NA        Prior Living Arrangements/Services Living arrangements for the past 2 months: Single Family Home Lives with:: Spouse Patient language and need for interpreter reviewed:: Yes Do you feel safe going back to the place where you live?: Yes      Need for Family Participation in Patient Care: Yes (Comment) Care giver support system in place?: Yes (comment) Current home services: DME Criminal Activity/Legal Involvement Pertinent to Current Situation/Hospitalization: No - Comment as needed  Activities of Daily Living   ADL Screening (condition at time of admission) Independently performs ADLs?: No  Permission Sought/Granted   Permission granted to share information with : No              Emotional  Assessment Appearance:: Appears stated age Attitude/Demeanor/Rapport: Engaged Affect (typically observed): Appropriate Orientation: : Oriented to Self, Oriented to Place, Oriented to  Time, Oriented to Situation Alcohol / Substance Use: Not Applicable Psych Involvement: No (comment)  Admission diagnosis:  Choledocholithiasis [K80.50] RUQ pain [R10.11] Transaminitis [R74.01] Calculus of gallbladder without cholecystitis without obstruction [K80.20] Fever, unspecified fever cause [R50.9] Patient Active Problem List   Diagnosis Date Noted   Choledocholithiasis 11/12/2023   Malnutrition of moderate degree 08/20/2023   Acute on chronic systolic CHF (congestive heart failure) (HCC) 08/12/2023   Pancytopenia (HCC) 08/12/2023   Anasarca 07/01/2023   Fever 11/07/2020   Acute kidney injury superimposed on chronic kidney disease (HCC) 11/06/2020   Asthma 09/27/2020   GERD (gastroesophageal reflux disease) 09/27/2020   Foot ulcer (HCC) 09/27/2020   NSTEMI (non-ST elevated myocardial infarction) (HCC) 09/27/2020   Personal history of tobacco use, presenting hazards to health 09/27/2020   Encounter for long-term (current) use of insulin  (HCC) 09/27/2020   Hyperkalemia 05/29/2020   Chronic pain of right hip 03/31/2020   Acquired spondylolisthesis of lumbosacral region 03/20/2020   Lumbar spondylosis 03/20/2020   Osseous and subluxation stenosis of intervertebral foramina of lumbar region 03/20/2020   Primary osteoarthritis of right hip 02/21/2020   ARF (acute renal failure) (HCC) 02/19/2020   Acute kidney injury superimposed on CKD (HCC) 02/18/2020   Type II diabetes mellitus  with neurological manifestations (HCC) 02/18/2020   Right groin pain 02/18/2020   Hyperlipidemia associated with type 2 diabetes mellitus (HCC) 02/18/2020   Coronary artery disease    Diabetes mellitus without complication (HCC)    Hypertension associated with diabetes (HCC)    Kidney insufficiency    Peripheral  vascular disease (HCC)    Aortic stenosis, moderate 10/15/2019   Primary osteoarthritis involving multiple joints 10/06/2019   Anemia of chronic disease 08/18/2019   Anemia, chronic disease 08/18/2019   Vitamin B12 deficiency 06/16/2019   Moderate aortic regurgitation 05/12/2019   Mitral regurgitation 05/12/2019   Hypoglycemia 05/07/2019   Regional wall motion abnormality of heart 05/07/2019   Polyneuropathy in diabetes (HCC) 05/07/2019   Non-compliance 05/05/2019   Chronic low back pain without sciatica 12/02/2018   Bilateral lower extremity edema 11/04/2018   Chronic idiopathic constipation 11/02/2018   Insomnia 11/02/2018   Acute kidney injury (HCC) 10/07/2018   CKD (chronic kidney disease) stage 4, GFR 15-29 ml/min (HCC) 10/07/2018   Abdominopelvic abscess (HCC) 09/29/2018   Abscess of appendix 09/22/2018   Appendicitis 09/08/2018   Vitamin D deficiency 05/07/2018   Puncture wound of right hip 04/02/2018   Bradycardia 03/25/2018   Malaise and fatigue 03/25/2018   Subacute osteomyelitis of left foot (HCC) 03/25/2018   Atherosclerosis of native arteries of the extremities with ulceration (HCC) 02/24/2018   Benign hypertension with CKD (chronic kidney disease) stage IV (HCC) 02/24/2018   Essential hypertension 02/24/2018   Screening for colon cancer 02/18/2018   Benign prostatic hyperplasia without lower urinary tract symptoms 01/14/2018   Erectile dysfunction 07/14/2017   Hypothyroidism 11/14/2015   Hypothyroidism (acquired) 11/14/2015   Hematuria 07/27/2015   PVD (peripheral vascular disease) with claudication (HCC) 07/26/2015   Congestive heart failure (HCC) 07/26/2015   Bruit of right carotid artery 07/26/2015   CAD (coronary artery disease) 07/26/2015   Cardiac murmur 07/26/2015   Dysphagia 07/26/2015   High risk medication use 07/26/2015   Hyperlipidemia 07/26/2015   Sleep apnea 07/26/2015   Chronic combined systolic and diastolic CHF (congestive heart failure)  (HCC) 07/26/2015   Heart murmur 07/26/2015   Type 2 diabetes mellitus with hyperlipidemia (HCC) 07/26/2015   Chronic systolic heart failure (HCC) 07/26/2015   Acute on chronic systolic (congestive) heart failure (HCC) 07/26/2015   PCP:  Abbe Hoard., MD Pharmacy:   [facility] [address] [place], [place] - [address] [address] [place] [address] Phone: [phone] Fax: [phone]     Social Drivers of Health (SDOH) Social History: SDOH Screenings   Food Insecurity: No Food Insecurity (11/12/2023)  Housing: Unknown (11/12/2023)  Transportation Needs: No Transportation Needs (09/12/2023)   Received from [facility]  Utilities: Low Risk  (09/12/2023)   Received from [facility]  Depression (PHQ2-9): Low Risk  (10/22/2018)  Financial Resource Strain: Low Risk  (09/26/2020)   Received from [facility], [facility]  Physical Activity: Inactive (09/26/2020)   Received from [facility], [facility]  Social Connections: Moderately Integrated (08/12/2023)  Stress: No Stress Concern Present (09/26/2020)   [facility] from [facility], [facility]  Tobacco Use: Medium Risk (11/11/2023)  Health Literacy: Low Risk  (09/26/2020)   Received from [facility], [facility]   SDOH Interventions:     Readmission Risk Interventions    08/13/2023    4:28 PM  Readmission Risk Prevention Plan  Transportation Screening Complete  HRI or Home Care Consult  Complete  Palliative Care Screening Not Applicable  Medication Review (RN Care Manager) Complete

## 2023-11-12 NOTE — Procedures (Signed)
 VIR brief postop note  Perc chole placed without immediate complication  Approx 20 cc bilious fluid removed, sent for culture and stain  To recovery in stable condition  Drain instructions ordered

## 2023-11-12 NOTE — Progress Notes (Signed)
 This nurse was able to teach his wife how to drain and flush the bilary drain. Wife felt comfortable at this time. Did explain that she might need to do it one more time.

## 2023-11-12 NOTE — Consult Note (Signed)
 Chief Complaint: Patient was seen in consultation today for acute cholecystitis  Referring Physician(s): Dr. Hassell Linsey  Supervising Physician: Creasie Doctor  Patient Status: [facility] - In-pt  History of Present Illness: Phillip [name] is a 68 y.o. male with past medical history of ESRD on HD, severe aortic stenosis planning on TAVR next week, HFrEF, NSTEMI 2022, CAD s/p CABG in 2003, GERD, DM2, HTN, thrombocytopenia, and prior lap appendectomy in 2020 who presented to [facility] ED with acute abdominal pain after he was found to be febrile at his dialysis center 6/10.  Initially his RUQ US  was nonspecific for acute cholecystitis despite RUQ pain, tachycardia, fever, and elevated lactic acid.  However, subsequent MR Abdomen/MRCP showed: 1. Distended gallbladder with innumerable tiny 1-3 mm layering gallstones. The gallbladder wall is thickened and markedly irregular in appearance with associated pericholecystic edema. Hyperemia noted in the subcapsular liver parenchyma adjacent to the gallbladder fossa. Imaging features compatible with acute cholecystitis. 2. While there is no substantial intrahepatic biliary duct dilatation, central biliary anatomy in the region of the biliary duct confluence passes through the hyperemic liver parenchyma. Component of ascending cholangitis in this region cannot be excluded. 3. The common duct appears attenuated by mass effect from the edema/inflammation as it passes adjacent to the gallbladder neck through the hepatoduodenal ligament. Common bile duct in the head of the pancreas is nondilated measuring only 3 mm diameter. No definite MR evidence for choledocholithiasis. 4. Markedly low signal intensity in the liver parenchyma on T2 imaging with paradoxical increased signal intensity on out of phase T1 weighted imaging, features compatible with iron  deposition disease.  In light of suspected acute cholecystitis in comorbid patient, IR consulted for percutaneous  cholecystostomy tube placement at the request of general surgery.   PA to bedside to discuss with patient who is familiar with gall bladder drainage after recent hospitalization of his brother who also required perc chole.  He understands the goals, risks, and benefits and is agreeable to proceed.    Past Medical History:  Diagnosis Date   Abdominopelvic abscess (HCC) 09/29/2018   Abscess of appendix 09/22/2018   Acquired spondylolisthesis of lumbosacral region 03/20/2020   Anemia of chronic disease 08/18/2019   Aortic atherosclerosis (HCC)    Aortic stenosis    a.) TTE 09/15/2018: mild-mod (MPG 19); b.) TTE 03/21/2021: mild-mod (MPG 14.3); c.) TTE 04/24/2022: mild- mod (MPG 15)   Appendicitis 09/08/2018   Asthma    Benign hypertension with CKD (chronic kidney disease) stage IV (HCC) 02/24/2018   Benign prostatic hyperplasia without lower urinary tract symptoms 01/14/2018   Bilateral lower extremity edema 11/04/2018   Bradycardia 03/25/2018   Bruit of right carotid artery 07/26/2015   CAD (coronary artery disease) 2003   a.) s/p 4v CABG 2003   Cardiac murmur 07/26/2015   Chronic combined systolic and diastolic CHF (congestive heart failure) (HCC) 07/26/2015   a.) TTE 09/15/2018: EF 50-55%, mod LVH, RVE, BAE, mild-mod TR, AoV sclerosis, G1DD; b.) MPI 10/27/2019: EF <30%; c.) TTE 03/21/2021: EF 35-40%, post AK, inf HK, mod LVH, mod red RVSF, mod LAE, mod Aov sclerosis, G2DD; c.) TTE 04/24/2022: EF 35-40%, post AK, glok HK, mod LVE, mod red RVSF, mild-mod MR, Aov sclerosis, G3DD   Chronic idiopathic constipation 11/02/2018   Chronic low back pain without sciatica 12/02/2018   Chronic pain of right hip 03/31/2020   CKD (chronic kidney disease) stage 4, GFR 15-29 ml/min (HCC) 10/07/2018   Coronary artery disease    Dysphagia 07/26/2015  Erectile dysfunction 07/14/2017   Essential hypertension 02/24/2018   Foot ulcer, left (HCC)    GERD (gastroesophageal reflux disease)    Hematuria  07/27/2015   History of marijuana use    Hyperlipidemia 07/26/2015   Hypertension    Hypothyroidism 11/14/2015   Insomnia 11/02/2018   Lumbar spondylosis 03/20/2020   Malaise and fatigue 03/25/2018   Myocardial infarction due to demand ischemia (HCC) 09/26/2020   a.) Type II NSTEMI; b.) troponins were trended 0.54 --> 0.56 --> 0.52 ng/mL   Non-compliance 05/05/2019   Osseous and subluxation stenosis of intervertebral foramina of lumbar region 03/20/2020   Osteoarthritis 10/06/2019   Peripheral vascular disease (HCC)    Personal history of tobacco use, presenting hazards to health 09/27/2020   Polyneuropathy in diabetes (HCC) 05/07/2019   Pulmonary HTN (HCC)    a.) TTE 05/12/2019: PASP 71; b.) TTE 09/27/2020: PASP >70; c.) TTE 03/21/2021: RVSP 43; d.) TTE 04/24/2022: RVSP 80.6   Puncture wound of right hip 04/02/2018   PVD (peripheral vascular disease) with claudication (HCC)    a.) s/p PTA 03/02/2018 - balloon angioplasty LEFT below knee popliteal artery; b.) s/p PTA 09/30/2022: baloon angioplasty LEFT tibioperoneal trunck, most proximal peroneal artery, and LEFT popliteal artery.   S/P CABG x 4 2003   Sleep apnea 07/26/2015   a.) does not require nocturnal PAP therapy   Subacute osteomyelitis of left foot (HCC) 03/25/2018   Thrombocytopenia (HCC)    Type 2 diabetes mellitus with stage 4 chronic kidney disease, with long-term current use of insulin  (HCC) 07/26/2015   Vitamin B12 deficiency 06/16/2019   Vitamin D deficiency 05/07/2018    Past Surgical History:  Procedure Laterality Date   AMPUTATION TOE Left 03/13/2018   Procedure: AMPUTATION TOE-MPJ;  Surgeon: Anell Baptist, DPM;  Location: [facility] ORS;  Service: Podiatry;  Laterality: Left;   ANGIOPLASTY     AV FISTULA PLACEMENT Left 08/20/2023   Procedure: ARTERIOVENOUS (AV) FISTULA CREATION;  Surgeon: Young Hensen, MD;  Location: [facility] OR;  Service: Vascular;  Laterality: Left;   CARDIAC CATHETERIZATION     CORONARY  ARTERY BYPASS GRAFT N/A 2003   IR FLUORO GUIDE CV LINE RIGHT  08/18/2023   IR US  GUIDE VASC ACCESS RIGHT  08/18/2023   LAPAROSCOPIC APPENDECTOMY N/A 09/08/2018   Procedure: APPENDECTOMY LAPAROSCOPIC;  Surgeon: Alben Alma, MD;  Location: [facility] ORS;  Service: General;  Laterality: N/A;   LOWER EXTREMITY ANGIOGRAPHY Left 03/02/2018   Procedure: LOWER EXTREMITY ANGIOGRAPHY;  Surgeon: Celso College, MD;  Location: [facility] INVASIVE CV LAB;  Service: Cardiovascular;  Laterality: Left;   LOWER EXTREMITY ANGIOGRAPHY Left 09/30/2022   Procedure: Lower Extremity Angiography;  Surgeon: Celso College, MD;  Location: [facility] INVASIVE CV LAB;  Service: Cardiovascular;  Laterality: Left;   RIGHT/LEFT HEART CATH AND CORONARY/GRAFT ANGIOGRAPHY N/A 08/01/2023   Procedure: RIGHT/LEFT HEART CATH AND CORONARY/GRAFT ANGIOGRAPHY;  Surgeon: Kyra Phy, MD;  Location: [facility] INVASIVE CV LAB;  Service: Cardiovascular;  Laterality: N/A;   ROTATOR CUFF REPAIR Left     Allergies: Patient has no known allergies.  Medications: Prior to Admission medications   Medication Sig Start Date End Date Taking? Authorizing Provider  acetaminophen  (TYLENOL ) 500 MG tablet Take 1,000 mg by mouth every 6 (six) hours as needed for moderate pain or headache.    Yes [provider]  aspirin  EC 81 MG tablet Take 1 tablet (81 mg total) by mouth daily. Swallow whole. 07/25/23  Yes Thukkani, Arun K, MD  atorvastatin  (LIPITOR) 20  MG tablet Take 1 tablet (20 mg total) by mouth daily. 07/25/23  Yes Thukkani, Arun K, MD  carvedilol  (COREG ) 3.125 MG tablet Take 1 tablet (3.125 mg total) by mouth 2 (two) times daily. 05/20/23 05/02/24 Yes Terrance Ferretti, NP  gabapentin  (NEURONTIN ) 300 MG capsule Take 1 capsule (300 mg total) by mouth at bedtime. Patient taking differently: Take 300 mg by mouth 2 (two) times daily. 08/25/23  Yes Arrien, Mauricio Daniel, MD  insulin  glargine (LANTUS ) 100 UNIT/ML injection Inject 7 Units into the skin daily as  needed (Diabetes). Inject 7 units at bedtime as needed after checking glucose   Yes [provider]  isosorbide  mononitrate (IMDUR ) 120 MG 24 hr tablet Take 1 tablet (120 mg total) by mouth daily. 08/27/23  Yes Revankar, Micael Adas, MD  latanoprost  (XALATAN ) 0.005 % ophthalmic solution Place 1 drop into both eyes at bedtime. 04/15/23  Yes [provider]  levothyroxine  (SYNTHROID ) 50 MCG tablet Take 50 mcg by mouth daily. 04/05/23  Yes [provider]  nitroGLYCERIN  (NITROSTAT ) 0.4 MG SL tablet Place 1 tablet (0.4 mg total) under the tongue every 5 (five) minutes as needed for chest pain. 11/20/20  Yes Revankar, Micael Adas, MD  tamsulosin  (FLOMAX ) 0.4 MG CAPS capsule Take 0.4 mg by mouth daily after breakfast. 04/19/20  Yes [provider]  vitamin B-12 (CYANOCOBALAMIN ) 500 MCG tablet Take 500 mcg by mouth daily.    Yes [provider]  Vitamin D, Ergocalciferol, (DRISDOL) 1.25 MG (50000 UNIT) CAPS capsule Take 50,000 Units by mouth once a week. Mondays 07/13/21  Yes [provider]     Family History  Problem Relation Age of Onset   Hyperlipidemia Mother    Hypertension Mother    Diabetes Mother    Heart attack Brother    Heart disease Brother    Lung disease Father     Social History   Socioeconomic History   Marital status: Married    Spouse name: Not on file   Number of children: Not on file   Years of education: Not on file   Highest education level: Not on file  Occupational History   Not on file  Tobacco Use   Smoking status: Former    Current packs/day: 0.00    Average packs/day: 0.5 packs/day for 25.0 years (12.5 ttl pk-yrs)    Types: Cigarettes    Start date: 35    Quit date: 2003    Years since quitting: 22.4   Smokeless tobacco: Never  Vaping Use   Vaping status: Never Used  Substance and Sexual Activity   Alcohol use: Not Currently    Comment: 2003   Drug use: Not Currently    Types: Marijuana   Sexual activity:  Not on file  Other Topics Concern   Not on file  Social History Narrative   Not on file   Social Drivers of Health   Financial Resource Strain: Low Risk  (09/26/2020)   Received from [facility], [facility]   Overall Financial Resource Strain (CARDIA)    Difficulty of Paying Living Expenses: Not hard at all  Food Insecurity: No Food Insecurity (11/12/2023)   Hunger Vital Sign    Worried About Running Out of Food in the Last Year: Never true    Ran Out of Food in the Last Year: Never true  Transportation Needs: No Transportation Needs (09/12/2023)   Received from [employer]    In the past 12 months,  has lack of reliable transportation kept you from medical appointments, meetings, work or from getting things needed for daily living? : No  Physical Activity: Inactive (09/26/2020)   Received from [facility], [facility]   Exercise Vital Sign    Days of Exercise per Week: 0 days    Minutes of Exercise per Session: 0 min  Stress: No Stress Concern Present (09/26/2020)   Received from [facility], [facility] of Occupational Health - Occupational Stress Questionnaire    Feeling of Stress : Not at all  Social Connections: Moderately Integrated (08/12/2023)   Social Connection and Isolation Panel [NHANES]    Frequency of Communication with Friends and Family: More than three times a week    Frequency of Social Gatherings with Friends and Family: Once a week    Attends Religious Services: More than 4 times per year    Active Member of [employer] or Organizations: No    Attends Banker Meetings: Never    Marital Status: Married     Review of Systems: A 12 point ROS discussed and pertinent positives are indicated in the HPI above.  All other systems are negative.  Review of Systems  Constitutional:  Negative for fatigue and fever.  Respiratory:  Negative for cough and shortness of breath.   Cardiovascular:  Negative  for chest pain.  Gastrointestinal:  Positive for abdominal pain. Negative for nausea and vomiting.  Musculoskeletal:  Negative for back pain.  Psychiatric/Behavioral:  Negative for behavioral problems and confusion.     Vital Signs: BP 115/77 (BP Location: Right Arm)   Pulse 88   Temp 98.4 F (36.9 C) (Oral)   Resp 17   Ht 6' 1 (1.854 m)   Wt 185 lb 3 oz (84 kg)   SpO2 97%   BMI 24.43 kg/m   Physical Exam Vitals and nursing note reviewed.  Constitutional:      General: He is not in acute distress.    Appearance: He is well-developed. He is not ill-appearing.  Cardiovascular:     Rate and Rhythm: Normal rate. Rhythm irregular.     Comments: Holosystolic murmer, occasional skipped beat Pulmonary:     Effort: Pulmonary effort is normal. No respiratory distress.  Abdominal:     Tenderness: There is abdominal tenderness in the right upper quadrant.  Skin:    General: Skin is warm and dry.  Neurological:     General: No focal deficit present.     Mental Status: He is alert and oriented to person, place, and time.  Psychiatric:        Mood and Affect: Mood normal.        Behavior: Behavior normal.      MD Evaluation Airway: WNL Heart: WNL Abdomen: WNL Chest/ Lungs: WNL ASA  Classification: 3 Mallampati/Airway Score: Two   Imaging: MR ABDOMEN MRCP W WO CONTAST Result Date: 11/12/2023 CLINICAL DATA:  Cholelithiasis on ultrasound. EXAM: MRI ABDOMEN WITHOUT AND WITH CONTRAST (INCLUDING MRCP) TECHNIQUE: Multiplanar multisequence MR imaging of the abdomen was performed both before and after the administration of intravenous contrast. Heavily T2-weighted images of the biliary and pancreatic ducts were obtained, and three-dimensional MRCP images were rendered by post processing. CONTRAST:  8mL GADAVIST GADOBUTROL 1 MMOL/ML IV SOLN COMPARISON:  Ultrasound exam from 1 day prior. CTA abdomen/pelvis 10/10/2023. FINDINGS: Lower chest: No acute findings. Hepatobiliary: Markedly low  signal intensity identified in the liver parenchyma on T2 imaging with paradoxical increased  signal intensity on out of phase T1 weighted imaging, features compatible with iron  deposition disease. No focal suspicious mass lesion within the liver parenchyma. Gallbladder is distended and innumerable tiny 1-3 mm layering gallstones there are evident. The gallbladder wall is thickened and markedly irregular in appearance with associated pericholecystic edema. There is hyperemia in the liver parenchyma adjacent to the gallbladder fossa. No substantial intrahepatic biliary duct dilatation. The common duct is attenuated by mass effect as it passes adjacent to the gallbladder neck through the hepatoduodenal ligament. Common bile duct in the head of the pancreas is nondilated measuring only 3 mm diameter. No definite MR evidence for choledocholithiasis. Pancreas: No focal mass lesion. No dilatation of the main duct. No intraparenchymal cyst. No peripancreatic edema. Spleen:  No splenomegaly. No suspicious focal mass lesion. Adrenals/Urinary Tract: No adrenal nodule or mass. Kidneys unremarkable. Stomach/Bowel: Stomach is unremarkable. No gastric wall thickening. No evidence of outlet obstruction. Duodenum is normally positioned as is the ligament of Treitz. No small bowel or colonic dilatation within the visualized abdomen. Vascular/Lymphatic: No abdominal aortic aneurysm. Portal vein, superior mesenteric vein, and splenic vein are patent. There is no gastrohepatic or hepatoduodenal ligament lymphadenopathy. No retroperitoneal or mesenteric lymphadenopathy. Other:  No substantial intraperitoneal free fluid. Musculoskeletal: No focal suspicious marrow enhancement within the visualized bony anatomy. IMPRESSION: 1. Distended gallbladder with innumerable tiny 1-3 mm layering gallstones. The gallbladder wall is thickened and markedly irregular in appearance with associated pericholecystic edema. Hyperemia noted in the subcapsular  liver parenchyma adjacent to the gallbladder fossa. Imaging features compatible with acute cholecystitis. 2. While there is no substantial intrahepatic biliary duct dilatation, central biliary anatomy in the region of the biliary duct confluence passes through the hyperemic liver parenchyma. Component of ascending cholangitis in this region cannot be excluded. 3. The common duct appears attenuated by mass effect from the edema/inflammation as it passes adjacent to the gallbladder neck through the hepatoduodenal ligament. Common bile duct in the head of the pancreas is nondilated measuring only 3 mm diameter. No definite MR evidence for choledocholithiasis. 4. Markedly low signal intensity in the liver parenchyma on T2 imaging with paradoxical increased signal intensity on out of phase T1 weighted imaging, features compatible with iron  deposition disease. These results will be called to the ordering clinician or representative by the Radiologist Assistant, and communication documented in the PACS or [employer]. Electronically Signed   By: Donnal Fusi M.D.   On: 11/12/2023 05:17   US  Abdomen Limited RUQ (LIVER/GB) Result Date: 11/11/2023 CLINICAL DATA:  151471 RUQ pain 151471 EXAM: ULTRASOUND ABDOMEN LIMITED RIGHT UPPER QUADRANT COMPARISON:  None Available. FINDINGS: Gallbladder: No gallstones or wall thickening visualized. No sonographic Murphy sign noted by sonographer. Common bile duct: Diameter: 4 mm Liver: No focal lesion identified. Within normal limits in parenchymal echogenicity. Portal vein is patent on color Doppler imaging with normal direction of blood flow towards the liver. Other: None. IMPRESSION: Cholelithiasis with no acute cholecystitis. Electronically Signed   By: Morgane  Naveau M.D.   On: 11/11/2023 21:17   DG Chest Port 1 View Result Date: 11/11/2023 CLINICAL DATA:  Possible sepsis EXAM: PORTABLE CHEST 1 VIEW COMPARISON:  08/19/2023 FINDINGS: Cardiac shadow is enlarged but stable.  Postsurgical changes are again seen. Right jugular dialysis catheter is noted. The lungs are well aerated. Elevation of the right hemidiaphragm is seen. No focal infiltrate or effusion is noted. No bony abnormality is seen. IMPRESSION: No active disease. Electronically Signed   By: Regenia Cape.D.  On: 11/11/2023 20:50    Labs:  CBC: Recent Labs    08/21/23 1400 08/22/23 0314 11/11/23 1930 11/12/23 0631  WBC 9.4 9.6 12.1* 8.1  HGB 10.0* 10.3* 12.8* 10.8*  HCT 30.8* 32.1* 38.8* 32.9*  PLT 130* 123* 81* 76*    COAGS: Recent Labs    08/17/23 0228 11/11/23 1930  INR 1.4* 1.4*    BMP: Recent Labs    08/21/23 0249 08/25/23 0316 11/11/23 1930 11/12/23 0631  NA 136 133* 134* 134*  K 4.3 4.7 3.8 3.7  CL 96* 95* 94* 96*  CO2 28 28 25 24   GLUCOSE 202* 123* 148* 85  BUN 75* 67* 31* 35*  CALCIUM  8.7* 9.3 8.1* 7.8*  CREATININE 4.60* 5.23* 4.93* 5.52*  GFRNONAA 13* 11* 12* 11*    LIVER FUNCTION TESTS: Recent Labs    08/15/23 0235 08/25/23 0316 11/11/23 1930 11/12/23 0631  BILITOT  --   --  4.1* 4.8*  AST  --   --  54* 49*  ALT  --   --  51* 42  ALKPHOS  --   --  158* 134*  PROT  --   --  8.0 7.0  ALBUMIN  3.0* 3.6 3.2* 2.7*    TUMOR MARKERS: No results for input(s): AFPTM, CEA, CA199, CHROMGRNA in the last 8760 hours.  Assessment and Plan: Acute cholecystitis  Patient presents with RUQ pain and hyperbilirubinemia coupled by MR findings compatible with acute cholecystitis.  IR consulted for percutaneous cholecystostomy tube placement by gen surg.  Lactic acidosis resolved.  WBC has returned WNL.  He is currently afebrile.  INR 1.4.  Platelets 76 in the sitting of known thrombocytopenia.   Case reviewed and approved by Dr Jinx Mourning.  Discussed with patient who is agreeable.   Risks and benefits discussed with the patient including, but not limited to bleeding, infection, gallbladder perforation, bile leak, sepsis or even death.  All of the patient's  questions were answered, patient is agreeable to proceed. Consent signed and in chart.   Thank you for this interesting consult.  I greatly enjoyed meeting DOC MANDALA and look forward to participating in their care.  A copy of this report was sent to the requesting provider on this date.  Electronically Signed: Alizah Sills Sue-Ellen Narissa Beaufort, PA 11/12/2023, 11:11 AM   I spent a total of 40 Minutes    in face to face in clinical consultation, greater than 50% of which was counseling/coordinating care for acute cholecystitis.

## 2023-11-13 ENCOUNTER — Other Ambulatory Visit: Payer: Self-pay

## 2023-11-13 DIAGNOSIS — I350 Nonrheumatic aortic (valve) stenosis: Secondary | ICD-10-CM | POA: Diagnosis not present

## 2023-11-13 DIAGNOSIS — I5020 Unspecified systolic (congestive) heart failure: Secondary | ICD-10-CM

## 2023-11-13 DIAGNOSIS — Z951 Presence of aortocoronary bypass graft: Secondary | ICD-10-CM

## 2023-11-13 DIAGNOSIS — D696 Thrombocytopenia, unspecified: Secondary | ICD-10-CM

## 2023-11-13 DIAGNOSIS — K810 Acute cholecystitis: Secondary | ICD-10-CM | POA: Diagnosis not present

## 2023-11-13 DIAGNOSIS — Z992 Dependence on renal dialysis: Secondary | ICD-10-CM

## 2023-11-13 DIAGNOSIS — A419 Sepsis, unspecified organism: Secondary | ICD-10-CM | POA: Diagnosis not present

## 2023-11-13 DIAGNOSIS — N186 End stage renal disease: Secondary | ICD-10-CM | POA: Diagnosis not present

## 2023-11-13 LAB — CBC
HCT: 36.8 % — ABNORMAL LOW (ref 39.0–52.0)
Hemoglobin: 11.6 g/dL — ABNORMAL LOW (ref 13.0–17.0)
MCH: 29.7 pg (ref 26.0–34.0)
MCHC: 31.5 g/dL (ref 30.0–36.0)
MCV: 94.4 fL (ref 80.0–100.0)
Platelets: 85 10*3/uL — ABNORMAL LOW (ref 150–400)
RBC: 3.9 MIL/uL — ABNORMAL LOW (ref 4.22–5.81)
RDW: 14.5 % (ref 11.5–15.5)
WBC: 6.3 10*3/uL (ref 4.0–10.5)
nRBC: 0 % (ref 0.0–0.2)

## 2023-11-13 LAB — HEPATIC FUNCTION PANEL
ALT: 97 U/L — ABNORMAL HIGH (ref 0–44)
AST: 187 U/L — ABNORMAL HIGH (ref 15–41)
Albumin: 2.7 g/dL — ABNORMAL LOW (ref 3.5–5.0)
Alkaline Phosphatase: 123 U/L (ref 38–126)
Bilirubin, Direct: 1.1 mg/dL — ABNORMAL HIGH (ref 0.0–0.2)
Indirect Bilirubin: 1.3 mg/dL — ABNORMAL HIGH (ref 0.3–0.9)
Total Bilirubin: 2.4 mg/dL — ABNORMAL HIGH (ref 0.0–1.2)
Total Protein: 8.1 g/dL (ref 6.5–8.1)

## 2023-11-13 LAB — GLUCOSE, CAPILLARY
Glucose-Capillary: 135 mg/dL — ABNORMAL HIGH (ref 70–99)
Glucose-Capillary: 157 mg/dL — ABNORMAL HIGH (ref 70–99)
Glucose-Capillary: 195 mg/dL — ABNORMAL HIGH (ref 70–99)
Glucose-Capillary: 211 mg/dL — ABNORMAL HIGH (ref 70–99)
Glucose-Capillary: 82 mg/dL (ref 70–99)

## 2023-11-13 LAB — BODY FLUID CULTURE W GRAM STAIN: Gram Stain: NONE SEEN

## 2023-11-13 LAB — HEMOGLOBIN A1C
Hgb A1c MFr Bld: 5.8 % — ABNORMAL HIGH (ref 4.8–5.6)
Mean Plasma Glucose: 119.76 mg/dL

## 2023-11-13 LAB — BASIC METABOLIC PANEL WITH GFR
Anion gap: 19 — ABNORMAL HIGH (ref 5–15)
BUN: 61 mg/dL — ABNORMAL HIGH (ref 8–23)
CO2: 22 mmol/L (ref 22–32)
Calcium: 8 mg/dL — ABNORMAL LOW (ref 8.9–10.3)
Chloride: 94 mmol/L — ABNORMAL LOW (ref 98–111)
Creatinine, Ser: 7.54 mg/dL — ABNORMAL HIGH (ref 0.61–1.24)
GFR, Estimated: 7 mL/min — ABNORMAL LOW (ref >60)
Glucose, Bld: 175 mg/dL — ABNORMAL HIGH (ref 70–99)
Potassium: 4.7 mmol/L (ref 3.5–5.1)
Sodium: 135 mmol/L (ref 135–145)

## 2023-11-13 LAB — HEPATITIS B SURFACE ANTIGEN
Hepatitis B Surface Ag: NONREACTIVE
Hepatitis B Surface Ag: NONREACTIVE

## 2023-11-13 MED ORDER — CARVEDILOL 3.125 MG PO TABS
3.1250 mg | ORAL_TABLET | Freq: Two times a day (BID) | ORAL | Status: DC
  Administered 2023-11-13 – 2023-11-14 (×2): 3.125 mg via ORAL
  Filled 2023-11-13 (×2): qty 1

## 2023-11-13 MED ORDER — ASPIRIN 81 MG PO TBEC
81.0000 mg | DELAYED_RELEASE_TABLET | Freq: Every day | ORAL | Status: DC
  Administered 2023-11-14: 81 mg via ORAL
  Filled 2023-11-13: qty 1

## 2023-11-13 MED ORDER — INSULIN ASPART 100 UNIT/ML IJ SOLN
0.0000 [IU] | Freq: Three times a day (TID) | INTRAMUSCULAR | Status: DC
  Administered 2023-11-13: 2 [IU] via SUBCUTANEOUS

## 2023-11-13 MED ORDER — CHLORHEXIDINE GLUCONATE CLOTH 2 % EX PADS
6.0000 | MEDICATED_PAD | Freq: Every day | CUTANEOUS | Status: DC
Start: 2023-11-14 — End: 2023-11-14
  Administered 2023-11-14: 6 via TOPICAL

## 2023-11-13 MED ORDER — ISOSORBIDE MONONITRATE ER 60 MG PO TB24
120.0000 mg | ORAL_TABLET | Freq: Every day | ORAL | Status: DC
  Administered 2023-11-14: 120 mg via ORAL
  Filled 2023-11-13: qty 2

## 2023-11-13 MED ORDER — PIPERACILLIN-TAZOBACTAM IN DEX 2-0.25 GM/50ML IV SOLN
2.2500 g | Freq: Three times a day (TID) | INTRAVENOUS | Status: DC
  Administered 2023-11-13 – 2023-11-14 (×3): 2.25 g via INTRAVENOUS
  Filled 2023-11-13 (×5): qty 50

## 2023-11-13 MED ORDER — ATORVASTATIN CALCIUM 10 MG PO TABS: 20.0000 mg | ORAL_TABLET | Freq: Every day | ORAL | Status: DC

## 2023-11-13 MED ORDER — HEPARIN SODIUM (PORCINE) 1000 UNIT/ML IJ SOLN
INTRAMUSCULAR | Status: AC
  Filled 2023-11-13: qty 4

## 2023-11-13 MED ORDER — INSULIN ASPART 100 UNIT/ML IJ SOLN: 0.0000 [IU] | Freq: Every day | INTRAMUSCULAR | Status: DC

## 2023-11-13 MED ORDER — TAMSULOSIN HCL 0.4 MG PO CAPS
0.4000 mg | ORAL_CAPSULE | Freq: Every day | ORAL | Status: DC
  Administered 2023-11-14: 0.4 mg via ORAL
  Filled 2023-11-13: qty 1

## 2023-11-13 MED ORDER — LEVOTHYROXINE SODIUM 50 MCG PO TABS
50.0000 ug | ORAL_TABLET | Freq: Every day | ORAL | Status: DC
  Administered 2023-11-14: 50 ug via ORAL
  Filled 2023-11-13: qty 1

## 2023-11-13 MED ORDER — CYANOCOBALAMIN 500 MCG PO TABS
500.0000 ug | ORAL_TABLET | Freq: Every day | ORAL | Status: DC
  Administered 2023-11-13 – 2023-11-14 (×2): 500 ug via ORAL
  Filled 2023-11-13 (×2): qty 1

## 2023-11-13 NOTE — Progress Notes (Signed)
 Referring Provider(s): Dr. Hassell Linsey, MD  Supervising Physician: Dr. Susan Ensign, MD  Patient Status:  [facility] - In-pt  Chief Complaint: Acute cholecystitis.  Subjective:  Patient alert and laying in bed, calm. Tolerating PO. Currently without any significant complaints. Patient feels he is improving. Patient denies any fevers, headache, chest pain, SOB, cough, abdominal pain, nausea, vomiting or bleeding.     Allergies: Patient has no known allergies.  Medications: Prior to Admission medications   Medication Sig Start Date End Date Taking? Authorizing Provider  acetaminophen  (TYLENOL ) 500 MG tablet Take 1,000 mg by mouth every 6 (six) hours as needed for moderate pain or headache.    Yes [provider]  aspirin  EC 81 MG tablet Take 1 tablet (81 mg total) by mouth daily. Swallow whole. 07/25/23  Yes Thukkani, Arun K, MD  atorvastatin  (LIPITOR) 20 MG tablet Take 1 tablet (20 mg total) by mouth daily. 07/25/23  Yes Thukkani, Arun K, MD  carvedilol  (COREG ) 3.125 MG tablet Take 1 tablet (3.125 mg total) by mouth 2 (two) times daily. 05/20/23 05/02/24 Yes Terrance Ferretti, NP  gabapentin  (NEURONTIN ) 300 MG capsule Take 1 capsule (300 mg total) by mouth at bedtime. Patient taking differently: Take 300 mg by mouth 2 (two) times daily. 08/25/23  Yes Arrien, Mauricio Daniel, MD  insulin  glargine (LANTUS ) 100 UNIT/ML injection Inject 7 Units into the skin daily as needed (Diabetes). Inject 7 units at bedtime as needed after checking glucose   Yes [provider]  isosorbide  mononitrate (IMDUR ) 120 MG 24 hr tablet Take 1 tablet (120 mg total) by mouth daily. 08/27/23  Yes Revankar, Micael Adas, MD  latanoprost  (XALATAN ) 0.005 % ophthalmic solution Place 1 drop into both eyes at bedtime. 04/15/23  Yes [provider]  levothyroxine  (SYNTHROID ) 50 MCG tablet Take 50 mcg by mouth daily. 04/05/23  Yes [provider]  nitroGLYCERIN  (NITROSTAT ) 0.4 MG SL tablet  Place 1 tablet (0.4 mg total) under the tongue every 5 (five) minutes as needed for chest pain. 11/20/20  Yes Revankar, Micael Adas, MD  tamsulosin  (FLOMAX ) 0.4 MG CAPS capsule Take 0.4 mg by mouth daily after breakfast. 04/19/20  Yes [provider]  vitamin B-12 (CYANOCOBALAMIN ) 500 MCG tablet Take 500 mcg by mouth daily.    Yes [provider]  Vitamin D, Ergocalciferol, (DRISDOL) 1.25 MG (50000 UNIT) CAPS capsule Take 50,000 Units by mouth once a week. Mondays 07/13/21  Yes [provider]     Vital Signs: BP 110/70   Pulse 71   Temp 98 F (36.7 C) (Oral)   Resp 17   Ht 6' 1 (1.854 m)   Wt 185 lb 3 oz (84 kg)   SpO2 95%   BMI 24.43 kg/m   Physical Exam Constitutional:      Appearance: Normal appearance.   Cardiovascular:     Rate and Rhythm: Normal rate.  Pulmonary:     Effort: Pulmonary effort is normal.  Abdominal:     General: Abdomen is flat.     Palpations: Abdomen is soft.     Tenderness: There is no abdominal tenderness.     Comments: RUQ  drain appropriately dressed. Dressing is clean, dry, intact. Drain incision site non-tender, without evidence of infection. Retaining suture in place.  Good biliary output in collection bag. Line flushes well.     Musculoskeletal:        General: Normal range of motion.   Skin:    General: Skin is warm  and dry.   Neurological:     Mental Status: He is alert and oriented to person, place, and time.      Labs:  CBC: Recent Labs    08/22/23 0314 11/11/23 1930 11/12/23 0631 11/13/23 0652  WBC 9.6 12.1* 8.1 6.3  HGB 10.3* 12.8* 10.8* 11.6*  HCT 32.1* 38.8* 32.9* 36.8*  PLT 123* 81* 76* 85*    COAGS: Recent Labs    08/17/23 0228 11/11/23 1930 11/12/23 1011  INR 1.4* 1.4* 1.3*    BMP: Recent Labs    08/25/23 0316 11/11/23 1930 11/12/23 0631 11/13/23 0652  NA 133* 134* 134* 135  K 4.7 3.8 3.7 4.7  CL 95* 94* 96* 94*  CO2 28 25 24 22   GLUCOSE 123* 148* 85 175*  BUN 67* 31*  35* 61*  CALCIUM  9.3 8.1* 7.8* 8.0*  CREATININE 5.23* 4.93* 5.52* 7.54*  GFRNONAA 11* 12* 11* 7*    LIVER FUNCTION TESTS: Recent Labs    08/25/23 0316 11/11/23 1930 11/12/23 0631 11/13/23 0821  BILITOT  --  4.1* 4.8* 2.4*  AST  --  54* 49* 187*  ALT  --  51* 42 97*  ALKPHOS  --  158* 134* 123  PROT  --  8.0 7.0 8.1  ALBUMIN  3.6 3.2* 2.7* 2.7*    Assessment and Plan:  68 y.o. male who presented to [facility] ED with acute abdominal pain after he was found to be febrile at his dialysis center 6/10. MR abdomen notable for acute cholecystitis with hepatic hyperemia. IR was consulted, and patient underwent percutaneous drain placement by Dr. Jinx Mourning on 6/11.  VSS. CBC stable. Cx with no WBC seen, few Gram Positive cocci in clusters, rare Gram positive cocci in chains. Output 40 mL overnight, appears bilious.    Drain Location: RUQ Size: Fr size: 10 Fr Date of placement: 11/12/23  Currently to: Drain collection device: gravity 24 hour output:  Output by Drain (mL) 11/11/23 0701 - 11/11/23 1900 11/11/23 1901 - 11/12/23 0700 11/12/23 0701 - 11/12/23 1900 11/12/23 1901 - 11/13/23 0700 11/13/23 0701 - 11/13/23 1100  Biliary Tube Cholecystostomy 10 Fr. RUQ   40 30     Interval imaging/drain manipulation:  None.  Current examination: Flushes/aspirates easily.  Insertion site unremarkable. Retaining suture in place. Dressed appropriately.   Plan: Continue TID flushes with 5 cc NS. Record output Q shift. Dressing changes QD or PRN if soiled.  Call IR APP or on call IR MD if difficulty flushing or sudden change in drain output.  Repeat imaging/possible drain injection once output < 10 mL/QD (excluding flush material). Consideration for drain removal if output is < 10 mL/QD (excluding flush material), pending discussion with the providing surgical service.  Discharge planning: Please contact IR APP or on call IR MD prior to patient d/c to ensure appropriate follow up plans are in  place. Typically patient will follow up with IR clinic 10-14 days post d/c for repeat imaging/possible drain injection. IR scheduler will contact patient with date/time of appointment. Patient will need to flush drain QD with 5 cc NS, record output QD, dressing changes every 2-3 days or earlier if soiled.   IR will continue to follow - please call with questions or concerns.     Thank you for this interesting consult.  I greatly enjoyed meeting Phillip [name] and look forward to participating in their care.   Electronically Signed: Lovena Rubinstein, PA-C 11/13/2023, 10:56 AM     I spent a total  of 15 Minutes at the the patient's bedside AND on the patient's hospital floor or unit, greater than 50% of which was counseling/coordinating care for acute cholecystitis, s/p drain placement.

## 2023-11-13 NOTE — Plan of Care (Signed)
  Problem: Clinical Measurements: Goal: Will remain free from infection Outcome: Progressing   Problem: Nutrition: Goal: Adequate nutrition will be maintained Outcome: Progressing   Problem: Coping: Goal: Level of anxiety will decrease Outcome: Progressing   Problem: Pain Managment: Goal: General experience of comfort will improve and/or be controlled Outcome: Progressing

## 2023-11-13 NOTE — Progress Notes (Signed)
 PHARMACY NOTE:  ANTIMICROBIAL RENAL DOSAGE ADJUSTMENT  Current antimicrobial regimen includes a mismatch between antimicrobial dosage and estimated renal function.  As per policy approved by the Pharmacy & Therapeutics and Medical Executive Committees, the antimicrobial dosage will be adjusted accordingly.  Current antimicrobial dosage:  Zosyn  3.375 g IV q8h to be infused over 4 hours  Indication: cholecystitis  Renal Function:  Estimated Creatinine Clearance: 10.6 mL/min (A) (by C-G formula based on SCr of 7.54 mg/dL (H)). [x]      On intermittent HD, scheduled: MWF []      On CRRT    Antimicrobial dosage has been changed to:  Zosyn  2.25 g IV q8h  Additional comments:   Thank you for involving pharmacy in this patient's care.  Caroline Cinnamon, PharmD, BCPS Clinical Pharmacist Clinical phone for 11/13/2023 is [phone] 11/13/2023 9:56 AM

## 2023-11-13 NOTE — Progress Notes (Signed)
  [facility]   MULTIDISCIPLINARY HEART VALVE TEAM   Pt was scheduled for TAVR next week, but given recent sepsis secondary to acute cholecystitis and possible ascending cholangitis with percutaneous drain placement, we will cancel his case. I have arranged for follow up in the office with me on 12/11/23. If stable from an infection standpoint, we will r/s TAVR for late July. Discussed with pt who was understanding.   Abagail Hoar PA-C  [facility]

## 2023-11-13 NOTE — Plan of Care (Signed)
  Problem: Clinical Measurements: Goal: Will remain free from infection Outcome: Progressing   Problem: Clinical Measurements: Goal: Diagnostic test results will improve Outcome: Progressing   Problem: Nutrition: Goal: Adequate nutrition will be maintained Outcome: Progressing   Problem: Coping: Goal: Level of anxiety will decrease Outcome: Progressing   Problem: Pain Managment: Goal: General experience of comfort will improve and/or be controlled Outcome: Progressing   Problem: Safety: Goal: Ability to remain free from injury will improve Outcome: Progressing

## 2023-11-13 NOTE — Progress Notes (Addendum)
 Subjective: CC: RUQ abdominal pain improved after perc chole drain placement yesterday. Tolerating diet without worsening abdominal pain, n/v.   Afebrile. No tachycardia. Soft BP resolved (HTN this am). WBC wnl. LFT's pending.   Objective: Vital signs in last 24 hours: Temp:  [98.2 F (36.8 C)-98.6 F (37 C)] 98.6 F (37 C) (06/12 0600) Pulse Rate:  [70-91] (P) 70 (06/12 0626) Resp:  [16-18] 18 (06/12 0600) BP: (94-140)/(64-87) (P) 155/67 (06/12 0626) SpO2:  [90 %-99 %] 92 % (06/12 0600) Last BM Date : 11/09/23  Intake/Output from previous day: 06/11 0701 - 06/12 0700 In: 458.1 [P.O.:340; I.V.:10; IV Piggyback:98.1] Out: 195 [Urine:125; Drains:70] Intake/Output this shift: No intake/output data recorded.  PE: Gen:  Alert, NAD, pleasant Abd: Soft, ND, mild RUQ ttp. Otherwise NT. IR perc chole drain with bilious output.   Lab Results:  Recent Labs    11/11/23 1930 11/12/23 0631  WBC 12.1* 8.1  HGB 12.8* 10.8*  HCT 38.8* 32.9*  PLT 81* 76*   BMET Recent Labs    11/11/23 1930 11/12/23 0631  NA 134* 134*  K 3.8 3.7  CL 94* 96*  CO2 25 24  GLUCOSE 148* 85  BUN 31* 35*  CREATININE 4.93* 5.52*  CALCIUM  8.1* 7.8*   PT/INR Recent Labs    11/11/23 1930 11/12/23 1011  LABPROT 17.1* 16.5*  INR 1.4* 1.3*   CMP     Component Value Date/Time   NA 134 (L) 11/12/2023 0631   NA 145 (H) 07/14/2023 1412   NA 136 08/30/2013 1025   K 3.7 11/12/2023 0631   K 3.7 08/30/2013 1025   CL 96 (L) 11/12/2023 0631   CL 101 08/30/2013 1025   CO2 24 11/12/2023 0631   CO2 31 08/30/2013 1025   GLUCOSE 85 11/12/2023 0631   GLUCOSE 310 (H) 08/30/2013 1025   BUN 35 (H) 11/12/2023 0631   BUN 97 (HH) 07/14/2023 1412   BUN 17 08/30/2013 1025   CREATININE 5.52 (H) 11/12/2023 0631   CREATININE 1.15 08/30/2013 1025   CALCIUM  7.8 (L) 11/12/2023 0631   CALCIUM  9.4 08/30/2013 1025   PROT 7.0 11/12/2023 0631   PROT 7.2 08/10/2019 1043   ALBUMIN  2.7 (L) 11/12/2023 0631    ALBUMIN  3.9 08/10/2019 1043   AST 49 (H) 11/12/2023 0631   ALT 42 11/12/2023 0631   ALKPHOS 134 (H) 11/12/2023 0631   BILITOT 4.8 (H) 11/12/2023 0631   BILITOT 0.7 08/10/2019 1043   GFRNONAA 11 (L) 11/12/2023 0631   GFRNONAA >60 08/30/2013 1025   GFRAA 27 (L) 02/22/2020 0539   GFRAA >60 08/30/2013 1025   Lipase     Component Value Date/Time   LIPASE 45 11/11/2023 1930    Studies/Results: IR Perc Cholecystostomy Result Date: 11/12/2023 INDICATION: Acute cholecystitis EXAM: Percutaneous cholecystostomy MEDICATIONS: The antibiotic was administered within an appropriate time frame prior to the initiation of the procedure. ANESTHESIA/SEDATION: Moderate (conscious) sedation was employed during this procedure. A total of Versed  0.5 mg and Fentanyl  50 mcg was administered intravenously. Moderate Sedation Time: 10 minutes. The patient's level of consciousness and vital signs were monitored continuously by radiology nursing throughout the procedure under my direct supervision. FLUOROSCOPY TIME:  Fluoroscopy Time: 0 minutes 30 seconds (13 mGy). COMPLICATIONS: None immediate. PROCEDURE: Informed written consent was obtained from the patient after a thorough discussion of the procedural risks, benefits and alternatives. All questions were addressed. Maximal Sterile Barrier Technique was utilized including caps, mask, sterile gowns, sterile gloves, sterile  drape, hand hygiene and skin antiseptic. A timeout was performed prior to the initiation of the procedure. The right upper quadrant was prepped and draped in usual sterile fashion. Local anesthetic was obtained with 1% lidocaine . Using real-time ultrasound guidance, an 18 gauge needle was advanced into the gallbladder. A stiff wire was placed. Serial dilatation was performed and a 10 French percutaneous drainage catheter was placed within the gallbladder. Approximately 20 cc of bilious fluid was removed and submitted for laboratory analysis. The tube was  secured in place using monofilament suture material and a dressing was applied. A gentle hand injection of contrast agent confirm placement within the gall bladder. There was no evidence of immediate postprocedure complication. IMPRESSION: 1. Percutaneous cholecystostomy tube placement. Electronically Signed   By: Reagan Camera M.D.   On: 11/12/2023 14:43   MR ABDOMEN MRCP W WO CONTAST Result Date: 11/12/2023 CLINICAL DATA:  Cholelithiasis on ultrasound. EXAM: MRI ABDOMEN WITHOUT AND WITH CONTRAST (INCLUDING MRCP) TECHNIQUE: Multiplanar multisequence MR imaging of the abdomen was performed both before and after the administration of intravenous contrast. Heavily T2-weighted images of the biliary and pancreatic ducts were obtained, and three-dimensional MRCP images were rendered by post processing. CONTRAST:  8mL GADAVIST GADOBUTROL 1 MMOL/ML IV SOLN COMPARISON:  Ultrasound exam from 1 day prior. CTA abdomen/pelvis 10/10/2023. FINDINGS: Lower chest: No acute findings. Hepatobiliary: Markedly low signal intensity identified in the liver parenchyma on T2 imaging with paradoxical increased signal intensity on out of phase T1 weighted imaging, features compatible with iron  deposition disease. No focal suspicious mass lesion within the liver parenchyma. Gallbladder is distended and innumerable tiny 1-3 mm layering gallstones there are evident. The gallbladder wall is thickened and markedly irregular in appearance with associated pericholecystic edema. There is hyperemia in the liver parenchyma adjacent to the gallbladder fossa. No substantial intrahepatic biliary duct dilatation. The common duct is attenuated by mass effect as it passes adjacent to the gallbladder neck through the hepatoduodenal ligament. Common bile duct in the head of the pancreas is nondilated measuring only 3 mm diameter. No definite MR evidence for choledocholithiasis. Pancreas: No focal mass lesion. No dilatation of the main duct. No  intraparenchymal cyst. No peripancreatic edema. Spleen:  No splenomegaly. No suspicious focal mass lesion. Adrenals/Urinary Tract: No adrenal nodule or mass. Kidneys unremarkable. Stomach/Bowel: Stomach is unremarkable. No gastric wall thickening. No evidence of outlet obstruction. Duodenum is normally positioned as is the ligament of Treitz. No small bowel or colonic dilatation within the visualized abdomen. Vascular/Lymphatic: No abdominal aortic aneurysm. Portal vein, superior mesenteric vein, and splenic vein are patent. There is no gastrohepatic or hepatoduodenal ligament lymphadenopathy. No retroperitoneal or mesenteric lymphadenopathy. Other:  No substantial intraperitoneal free fluid. Musculoskeletal: No focal suspicious marrow enhancement within the visualized bony anatomy. IMPRESSION: 1. Distended gallbladder with innumerable tiny 1-3 mm layering gallstones. The gallbladder wall is thickened and markedly irregular in appearance with associated pericholecystic edema. Hyperemia noted in the subcapsular liver parenchyma adjacent to the gallbladder fossa. Imaging features compatible with acute cholecystitis. 2. While there is no substantial intrahepatic biliary duct dilatation, central biliary anatomy in the region of the biliary duct confluence passes through the hyperemic liver parenchyma. Component of ascending cholangitis in this region cannot be excluded. 3. The common duct appears attenuated by mass effect from the edema/inflammation as it passes adjacent to the gallbladder neck through the hepatoduodenal ligament. Common bile duct in the head of the pancreas is nondilated measuring only 3 mm diameter. No definite MR evidence for choledocholithiasis. 4.  Markedly low signal intensity in the liver parenchyma on T2 imaging with paradoxical increased signal intensity on out of phase T1 weighted imaging, features compatible with iron  deposition disease. These results will be called to the ordering clinician  or representative by the Radiologist Assistant, and communication documented in the PACS or [employer]. Electronically Signed   By: Donnal Fusi M.D.   On: 11/12/2023 05:17   US  Abdomen Limited RUQ (LIVER/GB) Result Date: 11/11/2023 CLINICAL DATA:  151471 RUQ pain 151471 EXAM: ULTRASOUND ABDOMEN LIMITED RIGHT UPPER QUADRANT COMPARISON:  None Available. FINDINGS: Gallbladder: No gallstones or wall thickening visualized. No sonographic Murphy sign noted by sonographer. Common bile duct: Diameter: 4 mm Liver: No focal lesion identified. Within normal limits in parenchymal echogenicity. Portal vein is patent on color Doppler imaging with normal direction of blood flow towards the liver. Other: None. IMPRESSION: Cholelithiasis with no acute cholecystitis. Electronically Signed   By: Morgane  Naveau M.D.   On: 11/11/2023 21:17   DG Chest Port 1 View Result Date: 11/11/2023 CLINICAL DATA:  Possible sepsis EXAM: PORTABLE CHEST 1 VIEW COMPARISON:  08/19/2023 FINDINGS: Cardiac shadow is enlarged but stable. Postsurgical changes are again seen. Right jugular dialysis catheter is noted. The lungs are well aerated. Elevation of the right hemidiaphragm is seen. No focal infiltrate or effusion is noted. No bony abnormality is seen. IMPRESSION: No active disease. Electronically Signed   By: Violeta Grey M.D.   On: 11/11/2023 20:50    Anti-infectives: Anti-infectives (From admission, onward)    Start     Dose/Rate Route Frequency Ordered Stop   11/12/23 0200  piperacillin -tazobactam (ZOSYN ) IVPB 3.375 g       Placed in Followed by Linked Group   3.375 g 12.5 mL/hr over 240 Minutes Intravenous Every 8 hours 11/11/23 1959     11/11/23 2000  piperacillin -tazobactam (ZOSYN ) IVPB 3.375 g  Status:  Discontinued        3.375 g 100 mL/hr over 30 Minutes Intravenous Every 6 hours 11/11/23 1948 11/11/23 1948   11/11/23 2000  piperacillin -tazobactam (ZOSYN ) IVPB 3.375 g       Placed in Followed by Linked Group    3.375 g 100 mL/hr over 30 Minutes Intravenous  Once 11/11/23 1959 11/11/23 2056        Assessment/Plan Cholecystitis with elevated LFT's - S/p Percutaneous Cholecystostomy tube placement by IR 6/11 - Cont abx, cx pending. Would recommend 5d abx after source control (IR Percutaneous Cholecystostomy tube).  - LFT's pending this am. Continue to trend to ensure they are downtrending/resolve to his baseline. MRCP w/o definite MR evidence for choledocholithiasis. GI has seen and signed off.  - Discussed plan for him to follow up with IR in 6 weeks for Cholangiogram before following up with Dr. Leighton Punches in the office to discuss next steps. Would also like him to see Cardiology before his appointment with Dr. Leighton Punches for risk stratification (discussed w/ Cardiology). Noted he is scheduled for upcoming TAVR on 11/18/2023.  - We will sign off. Please call back with questions or concerns.    FEN - Okay for diet VTE - SCDs, okay for chem ppx or therapeutic ppx from a general surgery standpoint ID - Zosyn    ESRD on HD TTS Severe aortic stenosis scheduled for upcoming TAVR on 11/18/2023 HFrEF - Last echo done in November 2023 showing EF 35 to 40% and grade 3 diastolic dysfunction  CAD status post CABG in 2003 - noted results of heart cath in Feb 2025 (Widely patent LIMA  to LAD, Severe native vessel disease, 3 vein grafts from the aorta are all occluded) type 2 diabetes Hypertension Hyperlipidemia PAD Hypothyroidism Pulmonary hypertension  I reviewed nursing notes, Consultant (IR, GI) notes, last 24 h vitals and pain scores, last 48 h intake and output, last 24 h labs and trends, and last 24 h imaging results.   LOS: 1 day    Delton Filbert, Surgery [facility] 11/13/2023, 7:36 AM Please see Amion for pager number during day hours 7:00am-4:30pm

## 2023-11-13 NOTE — Consult Note (Signed)
 [facility] Nephrology Consult Note: Reason for Consult: To manage dialysis and dialysis related needs Referring Physician: Dr. Butch Cashing, Sundra Engel  HPI:  Phillip [name] is an 68 y.o. male with past medical history significant of hypertension, diabetes, ESRD on HD, CHF, admitted with cholecystitis with transaminitis underwent percutaneous cholecystostomy tube placement by IR on 6/11, seen as a consultation for the management of ESRD. The patient received dialysis TTS at [facility] in [place].  The last treatment was on Tuesday without any problem per patient.  They started cannulating fistula with 1 needle in last treatment only.  He has right IJ for the access.  He denies nausea, vomiting, chest pain, shortness of breath.  Today is his regular dialysis day.  Blood pressure and volume status looks acceptable.  The labs showed potassium 4.7, BUN 61, calcium  8, albumin  2.7, transaminitis, hemoglobin 11.6.  OP HD unit:  [facility] at  [facility] in [place] TTS. Internal jugular cath.   Past Medical History:  Diagnosis Date   Abdominopelvic abscess (HCC) 09/29/2018   Abscess of appendix 09/22/2018   Acquired spondylolisthesis of lumbosacral region 03/20/2020   Anemia of chronic disease 08/18/2019   Aortic atherosclerosis (HCC)    Aortic stenosis    a.) TTE 09/15/2018: mild-mod (MPG 19); b.) TTE 03/21/2021: mild-mod (MPG 14.3); c.) TTE 04/24/2022: mild- mod (MPG 15)   Appendicitis 09/08/2018   Asthma    Benign hypertension with CKD (chronic kidney disease) stage IV (HCC) 02/24/2018   Benign prostatic hyperplasia without lower urinary tract symptoms 01/14/2018   Bilateral lower extremity edema 11/04/2018   Bradycardia 03/25/2018   Bruit of right carotid artery 07/26/2015   CAD (coronary artery disease) 2003   a.) s/p 4v CABG 2003   Cardiac murmur 07/26/2015   Chronic combined systolic and diastolic CHF (congestive heart failure) (HCC) 07/26/2015   a.) TTE 09/15/2018: EF  50-55%, mod LVH, RVE, BAE, mild-mod TR, AoV sclerosis, G1DD; b.) MPI 10/27/2019: EF <30%; c.) TTE 03/21/2021: EF 35-40%, post AK, inf HK, mod LVH, mod red RVSF, mod LAE, mod Aov sclerosis, G2DD; c.) TTE 04/24/2022: EF 35-40%, post AK, glok HK, mod LVE, mod red RVSF, mild-mod MR, Aov sclerosis, G3DD   Chronic idiopathic constipation 11/02/2018   Chronic low back pain without sciatica 12/02/2018   Chronic pain of right hip 03/31/2020   CKD (chronic kidney disease) stage 4, GFR 15-29 ml/min (HCC) 10/07/2018   Coronary artery disease    Dysphagia 07/26/2015   Erectile dysfunction 07/14/2017   Essential hypertension 02/24/2018   Foot ulcer, left (HCC)    GERD (gastroesophageal reflux disease)    Hematuria 07/27/2015   History of marijuana use    Hyperlipidemia 07/26/2015   Hypertension    Hypothyroidism 11/14/2015   Insomnia 11/02/2018   Lumbar spondylosis 03/20/2020   Malaise and fatigue 03/25/2018   Myocardial infarction due to demand ischemia (HCC) 09/26/2020   a.) Type II NSTEMI; b.) troponins were trended 0.54 --> 0.56 --> 0.52 ng/mL   Non-compliance 05/05/2019   Osseous and subluxation stenosis of intervertebral foramina of lumbar region 03/20/2020   Osteoarthritis 10/06/2019   Peripheral vascular disease (HCC)    Personal history of tobacco use, presenting hazards to health 09/27/2020   Polyneuropathy in diabetes (HCC) 05/07/2019   Pulmonary HTN (HCC)    a.) TTE 05/12/2019: PASP 71; b.) TTE 09/27/2020: PASP >70; c.) TTE 03/21/2021: RVSP 43; d.) TTE 04/24/2022: RVSP 80.6   Puncture wound of right hip 04/02/2018   PVD (peripheral vascular disease) with  claudication (HCC)    a.) s/p PTA 03/02/2018 - balloon angioplasty LEFT below knee popliteal artery; b.) s/p PTA 09/30/2022: baloon angioplasty LEFT tibioperoneal trunck, most proximal peroneal artery, and LEFT popliteal artery.   S/P CABG x 4 2003   Sleep apnea 07/26/2015   a.) does not require nocturnal PAP therapy   Subacute  osteomyelitis of left foot (HCC) 03/25/2018   Thrombocytopenia (HCC)    Type 2 diabetes mellitus with stage 4 chronic kidney disease, with long-term current use of insulin  (HCC) 07/26/2015   Vitamin B12 deficiency 06/16/2019   Vitamin D deficiency 05/07/2018    Past Surgical History:  Procedure Laterality Date   AMPUTATION TOE Left 03/13/2018   Procedure: AMPUTATION TOE-MPJ;  Surgeon: Anell Baptist, DPM;  Location: [facility] ORS;  Service: Podiatry;  Laterality: Left;   ANGIOPLASTY     AV FISTULA PLACEMENT Left 08/20/2023   Procedure: ARTERIOVENOUS (AV) FISTULA CREATION;  Surgeon: Young Hensen, MD;  Location: [facility] OR;  Service: Vascular;  Laterality: Left;   CARDIAC CATHETERIZATION     CORONARY ARTERY BYPASS GRAFT N/A 2003   IR FLUORO GUIDE CV LINE RIGHT  08/18/2023   IR PERC CHOLECYSTOSTOMY  11/12/2023   IR US  GUIDE VASC ACCESS RIGHT  08/18/2023   LAPAROSCOPIC APPENDECTOMY N/A 09/08/2018   Procedure: APPENDECTOMY LAPAROSCOPIC;  Surgeon: Alben Alma, MD;  Location: [facility] ORS;  Service: General;  Laterality: N/A;   LOWER EXTREMITY ANGIOGRAPHY Left 03/02/2018   Procedure: LOWER EXTREMITY ANGIOGRAPHY;  Surgeon: Celso College, MD;  Location: [facility] INVASIVE CV LAB;  Service: Cardiovascular;  Laterality: Left;   LOWER EXTREMITY ANGIOGRAPHY Left 09/30/2022   Procedure: Lower Extremity Angiography;  Surgeon: Celso College, MD;  Location: [facility] INVASIVE CV LAB;  Service: Cardiovascular;  Laterality: Left;   RIGHT/LEFT HEART CATH AND CORONARY/GRAFT ANGIOGRAPHY N/A 08/01/2023   Procedure: RIGHT/LEFT HEART CATH AND CORONARY/GRAFT ANGIOGRAPHY;  Surgeon: Kyra Phy, MD;  Location: [facility] INVASIVE CV LAB;  Service: Cardiovascular;  Laterality: N/A;   ROTATOR CUFF REPAIR Left     Family History  Problem Relation Age of Onset   Hyperlipidemia Mother    Hypertension Mother    Diabetes Mother    Heart attack Brother    Heart disease Brother    Lung disease Father     Social History:  reports  that he quit smoking about 22 years ago. His smoking use included cigarettes. He started smoking about 47 years ago. He has a 12.5 pack-year smoking history. He has never used smokeless tobacco. He reports that he does not currently use alcohol. He reports that he does not currently use drugs after having used the following drugs: Marijuana.  Allergies: No Known Allergies  Medications: I have reviewed the patient's current medications.   Results for orders placed or performed during the hospital encounter of 11/11/23 (from the past 48 hours)  Comprehensive metabolic panel     Status: Abnormal   Collection Time: 11/11/23  7:30 PM  Result Value Ref Range   Sodium 134 (L) 135 - 145 mmol/L   Potassium 3.8 3.5 - 5.1 mmol/L   Chloride 94 (L) 98 - 111 mmol/L   CO2 25 22 - 32 mmol/L   Glucose, Bld 148 (H) 70 - 99 mg/dL    Comment: Glucose reference range applies only to samples taken after fasting for at least 8 hours.   BUN 31 (H) 8 - 23 mg/dL   Creatinine, Ser 1.61 (H) 0.61 - 1.24 mg/dL   Calcium  8.1 (L)  8.9 - 10.3 mg/dL   Total Protein 8.0 6.5 - 8.1 g/dL   Albumin  3.2 (L) 3.5 - 5.0 g/dL   AST 54 (H) 15 - 41 U/L   ALT 51 (H) 0 - 44 U/L   Alkaline Phosphatase 158 (H) 38 - 126 U/L   Total Bilirubin 4.1 (H) 0.0 - 1.2 mg/dL   GFR, Estimated 12 (L) >60 mL/min    Comment: (NOTE) Calculated using the CKD-EPI Creatinine Equation (2021)    Anion gap 15 5 - 15    Comment: Performed at [facility], [address] [place], [place] [address]  CBC with Differential     Status: Abnormal   Collection Time: 11/11/23  7:30 PM  Result Value Ref Range   WBC 12.1 (H) 4.0 - 10.5 K/uL   RBC 4.12 (L) 4.22 - 5.81 MIL/uL   Hemoglobin 12.8 (L) 13.0 - 17.0 g/dL   HCT 47.4 (L) 25.9 - 56.3 %   MCV 94.2 80.0 - 100.0 fL   MCH 31.1 26.0 - 34.0 pg   MCHC 33.0 30.0 - 36.0 g/dL   RDW 87.5 64.3 - 32.9 %   Platelets 81 (L) 150 - 400 K/uL    Comment: SPECIMEN CHECKED FOR CLOTS Immature Platelet Fraction may  be clinically indicated, consider ordering this additional test [id number] REPEATED TO VERIFY    nRBC 0.0 0.0 - 0.2 %   Neutrophils Relative % 87 %   Neutro Abs 10.4 (H) 1.7 - 7.7 K/uL   Lymphocytes Relative 5 %   Lymphs Abs 0.7 0.7 - 4.0 K/uL   Monocytes Relative 7 %   Monocytes Absolute 0.8 0.1 - 1.0 K/uL   Eosinophils Relative 0 %   Eosinophils Absolute 0.0 0.0 - 0.5 K/uL   Basophils Relative 0 %   Basophils Absolute 0.0 0.0 - 0.1 K/uL   Immature Granulocytes 1 %   Abs Immature Granulocytes 0.10 (H) 0.00 - 0.07 K/uL    Comment: Performed at [facility], [address], [place], [place] [address]  Protime-INR     Status: Abnormal   Collection Time: 11/11/23  7:30 PM  Result Value Ref Range   Prothrombin Time 17.1 (H) 11.4 - 15.2 seconds   INR 1.4 (H) 0.8 - 1.2    Comment: (NOTE) INR goal varies based on device and disease states. Performed at [facility], [address], [place], [place] [address]   Blood Culture (routine x 2)     Status: None (Preliminary result)   Collection Time: 11/11/23  7:30 PM   Specimen: BLOOD  Result Value Ref Range   Specimen Description BLOOD RIGHT ANTECUBITAL    Special Requests      BOTTLES DRAWN AEROBIC AND ANAEROBIC Blood Culture adequate volume   Culture      NO GROWTH 2 DAYS Performed at [facility], [address], [place], [place] [address]    Report Status PENDING   Lipase, blood     Status: None   Collection Time: 11/11/23  7:30 PM  Result Value Ref Range   Lipase 45 11 - 51 U/L    Comment: Performed at [facility], [address] [place], [place] [address]  I-Stat Lactic Acid, ED     Status: Abnormal   Collection Time: 11/11/23  7:40 PM  Result Value Ref Range   Lactic Acid, Venous 2.0 (HH) 0.5 - 1.9 mmol/L   Comment NOTIFIED PHYSICIAN   Resp panel by RT-PCR (  RSV, Flu A&B, Covid) Anterior Nasal Swab     Status: None   Collection Time: 11/11/23  8:22 PM   Specimen: Anterior Nasal Swab  Result  Value Ref Range   SARS Coronavirus 2 by RT PCR NEGATIVE NEGATIVE   Influenza A by PCR NEGATIVE NEGATIVE   Influenza B by PCR NEGATIVE NEGATIVE    Comment: (NOTE) The Xpert Xpress SARS-CoV-2/FLU/RSV plus assay is intended as an aid in the diagnosis of influenza from Nasopharyngeal swab specimens and should not be used as a sole basis for treatment. Nasal washings and aspirates are unacceptable for Xpert Xpress SARS-CoV-2/FLU/RSV testing.  Fact Sheet for Patients: BloggerCourse.com  Fact Sheet for Healthcare Providers: SeriousBroker.it  This test is not yet approved or cleared by the [place]  FDA and has been authorized for detection and/or diagnosis of SARS-CoV-2 by FDA under an Emergency Use Authorization (EUA). This EUA will remain in effect (meaning this test can be used) for the duration of the COVID-19 declaration under Section 564(b)(1) of the Act, 21 U.S.C. section 360bbb-3(b)(1), unless the authorization is terminated or revoked.     Resp Syncytial Virus by PCR NEGATIVE NEGATIVE    Comment: (NOTE) Fact Sheet for Patients: BloggerCourse.com  Fact Sheet for Healthcare Providers: SeriousBroker.it  This test is not yet approved or cleared by the [place]  FDA and has been authorized for detection and/or diagnosis of SARS-CoV-2 by FDA under an Emergency Use Authorization (EUA). This EUA will remain in effect (meaning this test can be used) for the duration of the COVID-19 declaration under Section 564(b)(1) of the Act, 21 U.S.C. section 360bbb-3(b)(1), unless the authorization is terminated or revoked.  Performed at [facility], [address] [place], [place] [address]   Blood Culture (routine x 2)     Status: None (Preliminary result)   Collection Time: 11/11/23  8:25 PM   Specimen: BLOOD RIGHT WRIST  Result Value Ref Range   Specimen Description  BLOOD RIGHT WRIST    Special Requests      BOTTLES DRAWN AEROBIC AND ANAEROBIC Blood Culture adequate volume   Culture      NO GROWTH 2 DAYS Performed at [facility], [address] [place], [place] [address]    Report Status PENDING   I-Stat Lactic Acid, ED     Status: None   Collection Time: 11/11/23  9:52 PM  Result Value Ref Range   Lactic Acid, Venous 1.7 0.5 - 1.9 mmol/L  Glucose, capillary     Status: None   Collection Time: 11/12/23  1:45 AM  Result Value Ref Range   Glucose-Capillary 95 70 - 99 mg/dL    Comment: Glucose reference range applies only to samples taken after fasting for at least 8 hours.  CBC     Status: Abnormal   Collection Time: 11/12/23  6:31 AM  Result Value Ref Range   WBC 8.1 4.0 - 10.5 K/uL   RBC 3.53 (L) 4.22 - 5.81 MIL/uL   Hemoglobin 10.8 (L) 13.0 - 17.0 g/dL   HCT 09.8 (L) 11.9 - 14.7 %   MCV 93.2 80.0 - 100.0 fL   MCH 30.6 26.0 - 34.0 pg   MCHC 32.8 30.0 - 36.0 g/dL   RDW 82.9 56.2 - 13.0 %   Platelets 76 (L) 150 - 400 K/uL    Comment: Immature Platelet Fraction may be clinically indicated, consider ordering this additional test [id number] REPEATED TO VERIFY    nRBC 0.0 0.0 - 0.2 %  Comment: Performed at [facility], [address], [place], [place] [address]  Comprehensive metabolic panel     Status: Abnormal   Collection Time: 11/12/23  6:31 AM  Result Value Ref Range   Sodium 134 (L) 135 - 145 mmol/L   Potassium 3.7 3.5 - 5.1 mmol/L   Chloride 96 (L) 98 - 111 mmol/L   CO2 24 22 - 32 mmol/L   Glucose, Bld 85 70 - 99 mg/dL    Comment: Glucose reference range applies only to samples taken after fasting for at least 8 hours.   BUN 35 (H) 8 - 23 mg/dL   Creatinine, Ser 6.04 (H) 0.61 - 1.24 mg/dL   Calcium  7.8 (L) 8.9 - 10.3 mg/dL   Total Protein 7.0 6.5 - 8.1 g/dL   Albumin  2.7 (L) 3.5 - 5.0 g/dL   AST 49 (H) 15 - 41 U/L   ALT 42 0 - 44 U/L   Alkaline Phosphatase 134 (H) 38 - 126 U/L   Total Bilirubin 4.8 (H) 0.0 -  1.2 mg/dL   GFR, Estimated 11 (L) >60 mL/min    Comment: (NOTE) Calculated using the CKD-EPI Creatinine Equation (2021)    Anion gap 14 5 - 15    Comment: Performed at [facility], [address], [place], [place] [address]  Glucose, capillary     Status: None   Collection Time: 11/12/23  8:44 AM  Result Value Ref Range   Glucose-Capillary 82 70 - 99 mg/dL    Comment: Glucose reference range applies only to samples taken after fasting for at least 8 hours.  Protime-INR     Status: Abnormal   Collection Time: 11/12/23 10:11 AM  Result Value Ref Range   Prothrombin Time 16.5 (H) 11.4 - 15.2 seconds   INR 1.3 (H) 0.8 - 1.2    Comment: (NOTE) INR goal varies based on device and disease states. Performed at [facility], [address], [place], [place] [address]   Glucose, capillary     Status: None   Collection Time: 11/12/23 11:46 AM  Result Value Ref Range   Glucose-Capillary 80 70 - 99 mg/dL    Comment: Glucose reference range applies only to samples taken after fasting for at least 8 hours.  Body fluid culture w Gram Stain     Status: None (Preliminary result)   Collection Time: 11/12/23 12:47 PM   Specimen: BILE; Body Fluid  Result Value Ref Range   Specimen Description BILE    Special Requests NONE    Gram Stain      NO WBC SEEN FEW GRAM POSITIVE COCCI IN CLUSTERS RARE GRAM POSITIVE COCCI IN CHAINS    Culture      CULTURE REINCUBATED FOR BETTER GROWTH Performed at [facility], [address], [place], [place] [address]    Report Status PENDING   Glucose, capillary     Status: Abnormal   Collection Time: 11/12/23  4:04 PM  Result Value Ref Range   Glucose-Capillary 134 (H) 70 - 99 mg/dL    Comment: Glucose reference range applies only to samples taken after fasting for at least 8 hours.  Glucose, capillary     Status: Abnormal   Collection Time: 11/12/23  8:38 PM  Result Value Ref Range   Glucose-Capillary 149 (H) 70 - 99 mg/dL    Comment:  Glucose reference range applies only to samples taken after fasting for at least 8 hours.  Glucose, capillary  Status: Abnormal   Collection Time: 11/13/23 12:38 AM  Result Value Ref Range   Glucose-Capillary 135 (H) 70 - 99 mg/dL    Comment: Glucose reference range applies only to samples taken after fasting for at least 8 hours.  Glucose, capillary     Status: Abnormal   Collection Time: 11/13/23  6:43 AM  Result Value Ref Range   Glucose-Capillary 157 (H) 70 - 99 mg/dL    Comment: Glucose reference range applies only to samples taken after fasting for at least 8 hours.  CBC     Status: Abnormal   Collection Time: 11/13/23  6:52 AM  Result Value Ref Range   WBC 6.3 4.0 - 10.5 K/uL   RBC 3.90 (L) 4.22 - 5.81 MIL/uL   Hemoglobin 11.6 (L) 13.0 - 17.0 g/dL   HCT 29.5 (L) 28.4 - 13.2 %   MCV 94.4 80.0 - 100.0 fL   MCH 29.7 26.0 - 34.0 pg   MCHC 31.5 30.0 - 36.0 g/dL   RDW 44.0 10.2 - 72.5 %   Platelets 85 (L) 150 - 400 K/uL    Comment: Immature Platelet Fraction may be clinically indicated, consider ordering this additional test [id number] REPEATED TO VERIFY    nRBC 0.0 0.0 - 0.2 %    Comment: Performed at [facility], [address], [place], [place] [address]  Basic metabolic panel with GFR     Status: Abnormal   Collection Time: 11/13/23  6:52 AM  Result Value Ref Range   Sodium 135 135 - 145 mmol/L   Potassium 4.7 3.5 - 5.1 mmol/L   Chloride 94 (L) 98 - 111 mmol/L   CO2 22 22 - 32 mmol/L   Glucose, Bld 175 (H) 70 - 99 mg/dL    Comment: Glucose reference range applies only to samples taken after fasting for at least 8 hours.   BUN 61 (H) 8 - 23 mg/dL   Creatinine, Ser 5.63 (H) 0.61 - 1.24 mg/dL   Calcium  8.0 (L) 8.9 - 10.3 mg/dL   GFR, Estimated 7 (L) >60 mL/min    Comment: (NOTE) Calculated using the CKD-EPI Creatinine Equation (2021)    Anion gap 19 (H) 5 - 15    Comment: Performed at [facility], [address], [place], [place] [address]  Glucose,  capillary     Status: Abnormal   Collection Time: 11/13/23  7:51 AM  Result Value Ref Range   Glucose-Capillary 211 (H) 70 - 99 mg/dL    Comment: Glucose reference range applies only to samples taken after fasting for at least 8 hours.  Hemoglobin A1c     Status: Abnormal   Collection Time: 11/13/23  8:21 AM  Result Value Ref Range   Hgb A1c MFr Bld 5.8 (H) 4.8 - 5.6 %    Comment: (NOTE) Diagnosis of Diabetes The following HbA1c ranges recommended by the American Diabetes Association (ADA) may be used as an aid in the diagnosis of diabetes mellitus.  Hemoglobin             Suggested A1C NGSP%              Diagnosis  <5.7                   Non Diabetic  5.7-6.4                Pre-Diabetic  >6.4  Diabetic  <7.0                   Glycemic control for                       adults with diabetes.     Mean Plasma Glucose 119.76 mg/dL    Comment: Performed at [facility], [address], [place], [place] [address]  Hepatic function panel     Status: Abnormal   Collection Time: 11/13/23  8:21 AM  Result Value Ref Range   Total Protein 8.1 6.5 - 8.1 g/dL   Albumin  2.7 (L) 3.5 - 5.0 g/dL   AST 191 (H) 15 - 41 U/L   ALT 97 (H) 0 - 44 U/L   Alkaline Phosphatase 123 38 - 126 U/L   Total Bilirubin 2.4 (H) 0.0 - 1.2 mg/dL   Bilirubin, Direct 1.1 (H) 0.0 - 0.2 mg/dL   Indirect Bilirubin 1.3 (H) 0.3 - 0.9 mg/dL    Comment: Performed at [facility], [address], [place], [place] [address]  Glucose, capillary     Status: Abnormal   Collection Time: 11/13/23 11:48 AM  Result Value Ref Range   Glucose-Capillary 195 (H) 70 - 99 mg/dL    Comment: Glucose reference range applies only to samples taken after fasting for at least 8 hours.    IR Perc Cholecystostomy Result Date: 11/12/2023 INDICATION: Acute cholecystitis EXAM: Percutaneous cholecystostomy MEDICATIONS: The antibiotic was administered within an appropriate time frame prior to the initiation of the  procedure. ANESTHESIA/SEDATION: Moderate (conscious) sedation was employed during this procedure. A total of Versed  0.5 mg and Fentanyl  50 mcg was administered intravenously. Moderate Sedation Time: 10 minutes. The patient's level of consciousness and vital signs were monitored continuously by radiology nursing throughout the procedure under my direct supervision. FLUOROSCOPY TIME:  Fluoroscopy Time: 0 minutes 30 seconds (13 mGy). COMPLICATIONS: None immediate. PROCEDURE: Informed written consent was obtained from the patient after a thorough discussion of the procedural risks, benefits and alternatives. All questions were addressed. Maximal Sterile Barrier Technique was utilized including caps, mask, sterile gowns, sterile gloves, sterile drape, hand hygiene and skin antiseptic. A timeout was performed prior to the initiation of the procedure. The right upper quadrant was prepped and draped in usual sterile fashion. Local anesthetic was obtained with 1% lidocaine . Using real-time ultrasound guidance, an 18 gauge needle was advanced into the gallbladder. A stiff wire was placed. Serial dilatation was performed and a 10 French percutaneous drainage catheter was placed within the gallbladder. Approximately 20 cc of bilious fluid was removed and submitted for laboratory analysis. The tube was secured in place using monofilament suture material and a dressing was applied. A gentle hand injection of contrast agent confirm placement within the gall bladder. There was no evidence of immediate postprocedure complication. IMPRESSION: 1. Percutaneous cholecystostomy tube placement. Electronically Signed   By: Reagan Camera M.D.   On: 11/12/2023 14:43   MR ABDOMEN MRCP W WO CONTAST Result Date: 11/12/2023 CLINICAL DATA:  Cholelithiasis on ultrasound. EXAM: MRI ABDOMEN WITHOUT AND WITH CONTRAST (INCLUDING MRCP) TECHNIQUE: Multiplanar multisequence MR imaging of the abdomen was performed both before and after the  administration of intravenous contrast. Heavily T2-weighted images of the biliary and pancreatic ducts were obtained, and three-dimensional MRCP images were rendered by post processing. CONTRAST:  8mL GADAVIST GADOBUTROL 1 MMOL/ML IV SOLN COMPARISON:  Ultrasound exam from 1 day prior. CTA abdomen/pelvis 10/10/2023. FINDINGS: Lower chest:  No acute findings. Hepatobiliary: Markedly low signal intensity identified in the liver parenchyma on T2 imaging with paradoxical increased signal intensity on out of phase T1 weighted imaging, features compatible with iron  deposition disease. No focal suspicious mass lesion within the liver parenchyma. Gallbladder is distended and innumerable tiny 1-3 mm layering gallstones there are evident. The gallbladder wall is thickened and markedly irregular in appearance with associated pericholecystic edema. There is hyperemia in the liver parenchyma adjacent to the gallbladder fossa. No substantial intrahepatic biliary duct dilatation. The common duct is attenuated by mass effect as it passes adjacent to the gallbladder neck through the hepatoduodenal ligament. Common bile duct in the head of the pancreas is nondilated measuring only 3 mm diameter. No definite MR evidence for choledocholithiasis. Pancreas: No focal mass lesion. No dilatation of the main duct. No intraparenchymal cyst. No peripancreatic edema. Spleen:  No splenomegaly. No suspicious focal mass lesion. Adrenals/Urinary Tract: No adrenal nodule or mass. Kidneys unremarkable. Stomach/Bowel: Stomach is unremarkable. No gastric wall thickening. No evidence of outlet obstruction. Duodenum is normally positioned as is the ligament of Treitz. No small bowel or colonic dilatation within the visualized abdomen. Vascular/Lymphatic: No abdominal aortic aneurysm. Portal vein, superior mesenteric vein, and splenic vein are patent. There is no gastrohepatic or hepatoduodenal ligament lymphadenopathy. No retroperitoneal or mesenteric  lymphadenopathy. Other:  No substantial intraperitoneal free fluid. Musculoskeletal: No focal suspicious marrow enhancement within the visualized bony anatomy. IMPRESSION: 1. Distended gallbladder with innumerable tiny 1-3 mm layering gallstones. The gallbladder wall is thickened and markedly irregular in appearance with associated pericholecystic edema. Hyperemia noted in the subcapsular liver parenchyma adjacent to the gallbladder fossa. Imaging features compatible with acute cholecystitis. 2. While there is no substantial intrahepatic biliary duct dilatation, central biliary anatomy in the region of the biliary duct confluence passes through the hyperemic liver parenchyma. Component of ascending cholangitis in this region cannot be excluded. 3. The common duct appears attenuated by mass effect from the edema/inflammation as it passes adjacent to the gallbladder neck through the hepatoduodenal ligament. Common bile duct in the head of the pancreas is nondilated measuring only 3 mm diameter. No definite MR evidence for choledocholithiasis. 4. Markedly low signal intensity in the liver parenchyma on T2 imaging with paradoxical increased signal intensity on out of phase T1 weighted imaging, features compatible with iron  deposition disease. These results will be called to the ordering clinician or representative by the Radiologist Assistant, and communication documented in the PACS or [employer]. Electronically Signed   By: Donnal Fusi M.D.   On: 11/12/2023 05:17   US  Abdomen Limited RUQ (LIVER/GB) Result Date: 11/11/2023 CLINICAL DATA:  151471 RUQ pain 151471 EXAM: ULTRASOUND ABDOMEN LIMITED RIGHT UPPER QUADRANT COMPARISON:  None Available. FINDINGS: Gallbladder: No gallstones or wall thickening visualized. No sonographic Murphy sign noted by sonographer. Common bile duct: Diameter: 4 mm Liver: No focal lesion identified. Within normal limits in parenchymal echogenicity. Portal vein is patent on color  Doppler imaging with normal direction of blood flow towards the liver. Other: None. IMPRESSION: Cholelithiasis with no acute cholecystitis. Electronically Signed   By: Morgane  Naveau M.D.   On: 11/11/2023 21:17   DG Chest Port 1 View Result Date: 11/11/2023 CLINICAL DATA:  Possible sepsis EXAM: PORTABLE CHEST 1 VIEW COMPARISON:  08/19/2023 FINDINGS: Cardiac shadow is enlarged but stable. Postsurgical changes are again seen. Right jugular dialysis catheter is noted. The lungs are well aerated. Elevation of the right hemidiaphragm is seen. No focal infiltrate or effusion is noted. No  bony abnormality is seen. IMPRESSION: No active disease. Electronically Signed   By: Violeta Grey M.D.   On: 11/11/2023 20:50    ROS: As per H&P, rest of the systems reviewed and negative. Blood pressure 110/70, pulse 71, temperature 98 F (36.7 C), temperature source Oral, resp. rate 17, height 6' 1 (1.854 m), weight 84 kg, SpO2 95%. Gen: NAD, comfortable Respiratory: Clear bilateral, no wheezing or crackle Cardiovascular: Regular rate rhythm S1-S2 normal, no rubs GI: Abdomen soft, drain present Extremities, no cyanosis or clubbing, no edema Skin: No rash or ulcer Neurology: Alert, awake, following commands, oriented Dialysis Access: Right IJ TDC in place, left upper extremity AV fistula has good thrill and bruit.  Maturing well  Assessment/Plan:  # Acute cholecystitis with transaminitis status post percutaneous cholecystostomy tube placed by IR on 6/11.  Management per IR and surgery team.  # ESRD: TTS, plan to resume regular dialysis today.  Use right IJ for the access.  UF as tolerated.  May obtain outpatient HD treatment required if patient remains in the hospital longer.  # Hypertension: Monitor BP, resume home medication  # Anemia of ESRD: Hemoglobin at goal, no need for erythropoietin.  # Metabolic Bone Disease: Monitor calcium  phosphorus.  Thank you for the consult, we will continue to  follow.  Shruthi Northrup [place] Planas 11/13/2023, 11:57 AM

## 2023-11-13 NOTE — Progress Notes (Signed)
 Progress Note   Patient: Phillip [name] WUJ:[id number] DOB: Sep 14, 1955 DOA: 11/11/2023     1 DOS: the patient was seen and examined on 11/13/2023   Brief hospital course: TROOPER OLANDER is a 68 y.o. male with medical history significant of ESRD on HD TTS, severe aortic stenosis scheduled for upcoming TAVR on 11/18/2023, HFrEF, CAD status post CABG in 2003, type 2 diabetes, hypertension, hyperlipidemia, PAD, hypothyroidism, pulmonary hypertension, history of laparoscopic appendectomy presented to the ED for evaluation of right upper quadrant abdominal pain, nausea, vomiting, and fever noted at dialysis.   Assessment and Plan: Sepsis secondary to acute cholecystitis  S/p percutaneous drain placement Presented with right upper quadrant abdominal pain, fever, tachycardia, tachypnea, mild leukocytosis, borderline lactic acidosis, and elevated liver enzymes with T. bili 4.1.   MRCP showing gallstones, acute cholecystitis, possible ascending cholangitis, but no definite evidence of choledocholithiasis.   General surgery advised drain placement given history of severe aortic stenosis.  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  No plan for ERCP. IR placed percutaneous drain 6/11. Continue Zosyn  and pain management.   Follow-up blood cultures.      ESRD on HD TTS Nephrology consulted for HD. He is on TTS schedule.   Thrombocytopenia Worse in the setting of infection. Continue to monitor CBC.   Severe aortic stenosis Patient is scheduled for upcoming TAVR on 11/18/2023 but now presenting with sepsis secondary to acute cholecystitis. Cardiology plan to move his follow up to 7/10 and possible TAVR date to late July.   Chronic HFrEF Last echo done in November 2023 showing EF 35 to 40% and grade 3 diastolic dysfunction.  No signs of volume overload at this time.  Volume management with dialysis per nephrology.   CAD status post CABG in  2003 Hyperlipidemia Resumed aspirin . Hold statin due to elevated LFT.   Type 2 diabetes A1c 5.4 on 09/11/2023.  Continue very sensitive sliding scale insulin  ACHS.   Severe pulmonary hypertension Hypertension PAD Hypothyroidism Resumed home meds Aspirin , coreg , imdur .     Out of bed to chair. Incentive spirometry. Nursing supportive care. Fall, aspiration precautions. Diet:  Diet Orders (From admission, onward)     Start     Ordered   11/12/23 1325  Diet heart healthy/carb modified Room service appropriate? Yes; Fluid consistency: Thin  Diet effective now       Question Answer Comment  Diet-HS Snack? Nothing   Room service appropriate? Yes   Fluid consistency: Thin      11/12/23 1324           DVT prophylaxis: SCDs Start: 11/12/23 9562  Level of care: Telemetry Medical   Code Status: Full Code  Subjective: Patient is seen and examined today morning. He is lying in bed, has abdominal pain. No nausea. Eating fair. Getting out of bed using cane.  Physical Exam: Vitals:   11/13/23 1700 11/13/23 1730 11/13/23 1800 11/13/23 1830  BP: (!) 101/47 (!) 103/55 (!) 105/51 106/65  Pulse: 69 71 75 81  Resp: 15 18 19 19   Temp:      TempSrc:      SpO2: 97% 97% 100% 97%  Weight:      Height:        General - Elderly African American male, no apparent distress HEENT - PERRLA, EOMI, atraumatic head, non tender sinuses. Lung - Clear, basal rales, no rhonchi, wheezes. Heart - S1, S2 heard, no murmurs, rubs, trace pedal edema. Abdomen -  Soft, RUQ tender, percutaneous drain in place, bowel sounds good Neuro - Alert, awake and oriented x 3, non focal exam. Skin - Warm and dry.  Data Reviewed:      Latest Ref Rng & Units 11/13/2023    6:52 AM 11/12/2023    6:31 AM 11/11/2023    7:30 PM  CBC  WBC 4.0 - 10.5 K/uL 6.3  8.1  12.1   Hemoglobin 13.0 - 17.0 g/dL 82.9  56.2  13.0   Hematocrit 39.0 - 52.0 % 36.8  32.9  38.8   Platelets 150 - 400 K/uL 85  76  81       Latest  Ref Rng & Units 11/13/2023    6:52 AM 11/12/2023    6:31 AM 11/11/2023    7:30 PM  BMP  Glucose 70 - 99 mg/dL 865  85  784   BUN 8 - 23 mg/dL 61  35  31   Creatinine 0.61 - 1.24 mg/dL 6.96  2.95  2.84   Sodium 135 - 145 mmol/L 135  134  134   Potassium 3.5 - 5.1 mmol/L 4.7  3.7  3.8   Chloride 98 - 111 mmol/L 94  96  94   CO2 22 - 32 mmol/L 22  24  25    Calcium  8.9 - 10.3 mg/dL 8.0  7.8  8.1    IR Perc Cholecystostomy Result Date: 11/12/2023 INDICATION: Acute cholecystitis EXAM: Percutaneous cholecystostomy MEDICATIONS: The antibiotic was administered within an appropriate time frame prior to the initiation of the procedure. ANESTHESIA/SEDATION: Moderate (conscious) sedation was employed during this procedure. A total of Versed  0.5 mg and Fentanyl  50 mcg was administered intravenously. Moderate Sedation Time: 10 minutes. The patient's level of consciousness and vital signs were monitored continuously by radiology nursing throughout the procedure under my direct supervision. FLUOROSCOPY TIME:  Fluoroscopy Time: 0 minutes 30 seconds (13 mGy). COMPLICATIONS: None immediate. PROCEDURE: Informed written consent was obtained from the patient after a thorough discussion of the procedural risks, benefits and alternatives. All questions were addressed. Maximal Sterile Barrier Technique was utilized including caps, mask, sterile gowns, sterile gloves, sterile drape, hand hygiene and skin antiseptic. A timeout was performed prior to the initiation of the procedure. The right upper quadrant was prepped and draped in usual sterile fashion. Local anesthetic was obtained with 1% lidocaine . Using real-time ultrasound guidance, an 18 gauge needle was advanced into the gallbladder. A stiff wire was placed. Serial dilatation was performed and a 10 French percutaneous drainage catheter was placed within the gallbladder. Approximately 20 cc of bilious fluid was removed and submitted for laboratory analysis. The tube was  secured in place using monofilament suture material and a dressing was applied. A gentle hand injection of contrast agent confirm placement within the gall bladder. There was no evidence of immediate postprocedure complication. IMPRESSION: 1. Percutaneous cholecystostomy tube placement. Electronically Signed   By: Reagan Camera M.D.   On: 11/12/2023 14:43   MR ABDOMEN MRCP W WO CONTAST Result Date: 11/12/2023 CLINICAL DATA:  Cholelithiasis on ultrasound. EXAM: MRI ABDOMEN WITHOUT AND WITH CONTRAST (INCLUDING MRCP) TECHNIQUE: Multiplanar multisequence MR imaging of the abdomen was performed both before and after the administration of intravenous contrast. Heavily T2-weighted images of the biliary and pancreatic ducts were obtained, and three-dimensional MRCP images were rendered by post processing. CONTRAST:  8mL GADAVIST GADOBUTROL 1 MMOL/ML IV SOLN COMPARISON:  Ultrasound exam from 1 day prior. CTA abdomen/pelvis 10/10/2023. FINDINGS: Lower chest: No acute findings. Hepatobiliary: Markedly low signal  intensity identified in the liver parenchyma on T2 imaging with paradoxical increased signal intensity on out of phase T1 weighted imaging, features compatible with iron  deposition disease. No focal suspicious mass lesion within the liver parenchyma. Gallbladder is distended and innumerable tiny 1-3 mm layering gallstones there are evident. The gallbladder wall is thickened and markedly irregular in appearance with associated pericholecystic edema. There is hyperemia in the liver parenchyma adjacent to the gallbladder fossa. No substantial intrahepatic biliary duct dilatation. The common duct is attenuated by mass effect as it passes adjacent to the gallbladder neck through the hepatoduodenal ligament. Common bile duct in the head of the pancreas is nondilated measuring only 3 mm diameter. No definite MR evidence for choledocholithiasis. Pancreas: No focal mass lesion. No dilatation of the main duct. No  intraparenchymal cyst. No peripancreatic edema. Spleen:  No splenomegaly. No suspicious focal mass lesion. Adrenals/Urinary Tract: No adrenal nodule or mass. Kidneys unremarkable. Stomach/Bowel: Stomach is unremarkable. No gastric wall thickening. No evidence of outlet obstruction. Duodenum is normally positioned as is the ligament of Treitz. No small bowel or colonic dilatation within the visualized abdomen. Vascular/Lymphatic: No abdominal aortic aneurysm. Portal vein, superior mesenteric vein, and splenic vein are patent. There is no gastrohepatic or hepatoduodenal ligament lymphadenopathy. No retroperitoneal or mesenteric lymphadenopathy. Other:  No substantial intraperitoneal free fluid. Musculoskeletal: No focal suspicious marrow enhancement within the visualized bony anatomy. IMPRESSION: 1. Distended gallbladder with innumerable tiny 1-3 mm layering gallstones. The gallbladder wall is thickened and markedly irregular in appearance with associated pericholecystic edema. Hyperemia noted in the subcapsular liver parenchyma adjacent to the gallbladder fossa. Imaging features compatible with acute cholecystitis. 2. While there is no substantial intrahepatic biliary duct dilatation, central biliary anatomy in the region of the biliary duct confluence passes through the hyperemic liver parenchyma. Component of ascending cholangitis in this region cannot be excluded. 3. The common duct appears attenuated by mass effect from the edema/inflammation as it passes adjacent to the gallbladder neck through the hepatoduodenal ligament. Common bile duct in the head of the pancreas is nondilated measuring only 3 mm diameter. No definite MR evidence for choledocholithiasis. 4. Markedly low signal intensity in the liver parenchyma on T2 imaging with paradoxical increased signal intensity on out of phase T1 weighted imaging, features compatible with iron  deposition disease. These results will be called to the ordering clinician  or representative by the Radiologist Assistant, and communication documented in the PACS or [employer]. Electronically Signed   By: Donnal Fusi M.D.   On: 11/12/2023 05:17   US  Abdomen Limited RUQ (LIVER/GB) Result Date: 11/11/2023 CLINICAL DATA:  151471 RUQ pain 151471 EXAM: ULTRASOUND ABDOMEN LIMITED RIGHT UPPER QUADRANT COMPARISON:  None Available. FINDINGS: Gallbladder: No gallstones or wall thickening visualized. No sonographic Murphy sign noted by sonographer. Common bile duct: Diameter: 4 mm Liver: No focal lesion identified. Within normal limits in parenchymal echogenicity. Portal vein is patent on color Doppler imaging with normal direction of blood flow towards the liver. Other: None. IMPRESSION: Cholelithiasis with no acute cholecystitis. Electronically Signed   By: Morgane  Naveau M.D.   On: 11/11/2023 21:17   DG Chest Port 1 View Result Date: 11/11/2023 CLINICAL DATA:  Possible sepsis EXAM: PORTABLE CHEST 1 VIEW COMPARISON:  08/19/2023 FINDINGS: Cardiac shadow is enlarged but stable. Postsurgical changes are again seen. Right jugular dialysis catheter is noted. The lungs are well aerated. Elevation of the right hemidiaphragm is seen. No focal infiltrate or effusion is noted. No bony abnormality is seen. IMPRESSION: No active  disease. Electronically Signed   By: Violeta Grey M.D.   On: 11/11/2023 20:50    Family Communication: Discussed with patient, he understand and agree. All questions answered.  Disposition: Status is: Inpatient Remains inpatient appropriate because: IV antibiotics, perc chole tube, HD  Planned Discharge Destination: Home with Home Health     Time spent: 40 minutes  Author: Aisha Hove, MD 11/13/2023 6:41 PM Secure chat 7am to 7pm For on call review [email or web address].

## 2023-11-14 ENCOUNTER — Other Ambulatory Visit (HOSPITAL_COMMUNITY)

## 2023-11-14 ENCOUNTER — Other Ambulatory Visit (HOSPITAL_COMMUNITY): Payer: Self-pay

## 2023-11-14 DIAGNOSIS — E1149 Type 2 diabetes mellitus with other diabetic neurological complication: Secondary | ICD-10-CM

## 2023-11-14 DIAGNOSIS — E039 Hypothyroidism, unspecified: Secondary | ICD-10-CM

## 2023-11-14 LAB — CBC
HCT: 35.1 % — ABNORMAL LOW (ref 39.0–52.0)
Hemoglobin: 11.4 g/dL — ABNORMAL LOW (ref 13.0–17.0)
MCH: 30.3 pg (ref 26.0–34.0)
MCHC: 32.5 g/dL (ref 30.0–36.0)
MCV: 93.4 fL (ref 80.0–100.0)
Platelets: 95 10*3/uL — ABNORMAL LOW (ref 150–400)
RBC: 3.76 MIL/uL — ABNORMAL LOW (ref 4.22–5.81)
RDW: 14.4 % (ref 11.5–15.5)
WBC: 6.8 10*3/uL (ref 4.0–10.5)
nRBC: 0 % (ref 0.0–0.2)

## 2023-11-14 LAB — GLUCOSE, CAPILLARY
Glucose-Capillary: 108 mg/dL — ABNORMAL HIGH (ref 70–99)
Glucose-Capillary: 108 mg/dL — ABNORMAL HIGH (ref 70–99)
Glucose-Capillary: 110 mg/dL — ABNORMAL HIGH (ref 70–99)
Glucose-Capillary: 115 mg/dL — ABNORMAL HIGH (ref 70–99)

## 2023-11-14 LAB — BASIC METABOLIC PANEL WITH GFR
Anion gap: 15 (ref 5–15)
BUN: 35 mg/dL — ABNORMAL HIGH (ref 8–23)
CO2: 25 mmol/L (ref 22–32)
Calcium: 8.4 mg/dL — ABNORMAL LOW (ref 8.9–10.3)
Chloride: 95 mmol/L — ABNORMAL LOW (ref 98–111)
Creatinine, Ser: 5.48 mg/dL — ABNORMAL HIGH (ref 0.61–1.24)
GFR, Estimated: 11 mL/min — ABNORMAL LOW (ref >60)
Glucose, Bld: 112 mg/dL — ABNORMAL HIGH (ref 70–99)
Potassium: 4.1 mmol/L (ref 3.5–5.1)
Sodium: 135 mmol/L (ref 135–145)

## 2023-11-14 LAB — HEPATITIS B SURFACE ANTIBODY, QUANTITATIVE: Hep B S AB Quant (Post): <3.5 m[IU]/mL — ABNORMAL LOW

## 2023-11-14 MED ORDER — MENTHOL 3 MG MT LOZG
1.0000 | LOZENGE | OROMUCOSAL | Status: DC | PRN
  Administered 2023-11-14: 3 mg via ORAL
  Filled 2023-11-14: qty 9

## 2023-11-14 MED ORDER — SODIUM CHLORIDE 0.9% FLUSH
5.0000 mL | Freq: Every day | INTRAVENOUS | 0 refills | Status: DC
  Filled 2023-11-14: qty 75, 15d supply, fill #0

## 2023-11-14 MED ORDER — AMOXICILLIN-POT CLAVULANATE 500-125 MG PO TABS
1.0000 | ORAL_TABLET | Freq: Every day | ORAL | 0 refills | Status: AC
Start: 2023-11-14 — End: 2023-11-24
  Filled 2023-11-14: qty 10, 10d supply, fill #0

## 2023-11-14 NOTE — Plan of Care (Signed)

## 2023-11-14 NOTE — Progress Notes (Signed)
 Received patient in bed to unit.  Alert and oriented.  Informed consent signed and in chart.   TX duration:3.5 hours  Patient tolerated well.  Transported back to the room  Alert, without acute distress.  Hand-off given to patient's nurse.   Access used: catheter Access issues: none  Total UF removed: 2000 ml Medication(s) given: none Post HD VS: 103/65 Post HD weight: unable to obtain   11/13/23 1925  Vitals  Temp 98.8 F (37.1 C)  Temp Source Oral  BP 103/65  MAP (mmHg) 76  BP Location Right Arm  BP Method Automatic  Patient Position (if appropriate) Lying  Pulse Rate 88  ECG Heart Rate 82  Resp 19  Oxygen Therapy  SpO2 96 %  O2 Device Room Air  During Treatment Monitoring  Blood Flow Rate (mL/min) 0 mL/min  Arterial Pressure (mmHg) 28.28 mmHg  Venous Pressure (mmHg) -360.99 mmHg  TMP (mmHg) 16.36 mmHg  Ultrafiltration Rate (mL/min) 831 mL/min  Dialysate Flow Rate (mL/min) 300 ml/min  Duration of HD Treatment -hour(s) 3.5 hour(s)  Cumulative Fluid Removed (mL) per Treatment  2000.15  Post Treatment  Dialyzer Clearance Heavily streaked  Hemodialysis Intake (mL) 0 mL  Liters Processed 84  Fluid Removed (mL) 2000 mL  Tolerated HD Treatment Yes  Post-Hemodialysis Comments HD tx achieved as expected, tolearted well, no complints.  pt is stable.  AVG/AVF Arterial Site Held (minutes) 0 minutes  AVG/AVF Venous Site Held (minutes) 0 minutes  Note  Patient Observations pt is in bed resting  Hemodialysis Catheter Right Internal jugular Double lumen Permanent (Tunneled)  Placement Date/Time: 08/18/23 0824   Serial / Lot #: [id number]  Expiration Date: 03/02/28  Time Out: Correct patient;Correct site;Correct procedure  Maximum sterile barrier precautions: Hand hygiene;Cap;Mask;Sterile gown;Sterile gloves;Large sterile s...  Site Condition No complications  Blue Lumen Status Flushed;Heparin  locked;Dead end cap in place  Red Lumen Status Flushed;Heparin  locked;Dead end  cap in place  Purple Lumen Status N/A  Catheter fill solution Heparin  1000 units/ml  Catheter fill volume (Arterial) 1.8 cc  Catheter fill volume (Venous) 1.8  Dressing Type Transparent  Dressing Status Antimicrobial disc/dressing in place  Drainage Description None  Post treatment catheter status Capped and Clamped      [name]

## 2023-11-14 NOTE — Progress Notes (Signed)
 D/C order noted. Contacted [facility] to advise clinic staff of pt's d/c today and that pt should resume care tomorrow. Today's renal note faxed to clinic for continuation of care. Will fax d/c summary to clinic once completed.   Lauraine Polite Renal Navigator [phone]  Addendum on Monday, November 17, 2023: D/C summary and last renal noted faxed to clinic for continuation of care.

## 2023-11-14 NOTE — Discharge Instructions (Addendum)
 Interventional Radiology Percutaneous Abscess Drain Placement After Care  This sheet gives you information about how to care for yourself after your procedure. Your health care provider may also give you more specific instructions. Your drain was placed by an interventional radiologist with [facility]. If you have questions or concerns, contact [facility] at [phone]. What is a percutaneous drain? A drain is a small plastic tube (catheter) that goes into the fluid collection in your body through your skin. How long will I need the drain? How long the drain needs to stay in is determined by where the drain is, how much comes out of the drain each day and if you are having any other surgical procedures. Interventional radiology will determine when it is time to remove the drain. It is important to follow up as directed so that the drain can be removed as soon as it is safe to do so. What can I expect after the procedure? After the procedure, it is common to have: A small amount of bruising and discomfort in the area where the drainage tube (catheter) was placed. Sleepiness and fatigue. This should go away after the medicines you were given have worn off. Follow these instructions at home: Insertion site care Check your insertion site when you change the bandage. Check for: More redness, swelling, or pain. More fluid or blood. Warmth. Pus or a bad smell. When caring for your insertion site: Wash your hands with soap and water for at least 20 seconds before and after you change your bandage (dressing). If soap and water are not available, use hand sanitizer. You do not need to change your dressing everyday if it is clean and dry. Change your dressing every 3 days or as needed when it is soiled, wet or becoming dislodged. You will need to change your dressing each time you shower. Leave stitches (sutures), skin glue, or adhesive strips in place. These skin closures may need  to stay in place for 2 weeks or longer. If adhesive strip edges start to loosen and curl up, you may trim the loose edges. Do not remove adhesive strips completely unless your health care provider tells you to do so.  Catheter care Your drain does not need to be flushed regularly unless output is thick and does not flow into collection bag well, or if output stops altogether. If you should need to flush your drain, do as follows.  Flush the catheter with 5 mL of 0.9% normal saline. This helps to prevent clogs in the catheter. To disconnect the drain, turn the clear plastic tube to the left. Attach the saline syringe by placing it on the white end of the drain and turning gently to the right. Once attached gently push the plunger to the 5 mL mark. After you are done flushing, disconnect the syringe by turning to the left and reattach your drainage container    If you have a bulb please be sure the bulb is charged after reconnecting it - to do this pinch the bulb between your thumb and first finger and close the stopper located on the top of the bulb.   Check for fluid leaking from around your catheter (instead of fluid draining through your catheter). This may be a sign that the drain is no longer working correctly. Write down the following information every time you empty your bag: The date and time. The amount of drainage. Activity Rest at home for 1-2 days after your procedure. For the first  48 hours do not lift anything more than 10 lbs (about a gallon of milk). You may perform moderate activities/exercise. Please avoid strenuous activities during this time. Avoid any activities which may pull on your drain as this can cause your drain to become dislodged. If you were given a sedative during the procedure, it can affect you for several hours. Do not drive or operate machinery until your health care provider says that it is safe. General instructions For mild pain take over-the-counter  medications as needed for pain such as Tylenol  or Advil. If you are experiencing severe pain please call our office as this may indicate an issue with your drain.  If you were prescribed an antibiotic medicine, take it as told by your health care provider. Do not stop using the antibiotic even if you start to feel better. You may shower 24 hours after the drain is placed. To do this cover the insertion site with a water tight material such as saran wrap and seal the edges with tape, you may also purchase waterproof dressings at your local drug store. Shower as usual and then remove the water tight dressing and any gauze/tape underneath it once you have exited the shower and dried off. Allow the area to air dry or pat dry with a clean towel. Once the skin is completely dry place a new gauze dressing. It is important to keep the site dry at all times to prevent infection. Do not submerge the drain - this means you cannot take baths, swim, use a hot tub, etc. until the drain is removed.  Do not use any products that contain nicotine or tobacco, such as cigarettes, e-cigarettes, and chewing tobacco. If you need help quitting, ask your health care provider. Keep all follow-up visits as told by your health care provider. This is important. Contact a health care provider if: You have less than 10 mL of drainage a day for 2-3 days in a row, or as directed by your health care provider. You have any of these signs of infection: More redness, swelling, or pain around your incision area. More fluid or blood coming from your incision area. Warmth coming from your incision area. Pus or a bad smell coming from your incision area. You have fluid leaking from around your catheter (instead of through your catheter). You are unable to flush the drain. You have a fever or chills. You have pain that does not get better with medicine. You have not been contacted to schedule a drain follow up appointment within 10 days of  discharge from the hospital. Please call [facility] at [phone] with any questions or concerns. Get help right away if: Your catheter comes out. You suddenly stop having drainage from your catheter. You suddenly have blood in the fluid that is draining from your catheter. You become dizzy or you faint. You develop a rash. You have nausea or vomiting. You have difficulty breathing or you feel short of breath. You develop chest pain. You have problems with your speech or vision. You have trouble balancing or moving your arms or legs. Summary It is common to have a small amount of bruising and discomfort in the area where the drainage tube (catheter) was placed. You may also have minor discomfort with movement while the drain is in place. Flush the drain as needed with 5 mL of 0.9% normal saline. Record the amount of drainage from the bag every time you empty it. Change the dressing every 3 days  or earlier if soiled/wet. Keep the skin dry under the dressing. You may shower with the drain in place. Do not submerge the drain (no baths, swimming, hot tubs, etc.). Contact [facility] at [phone] if you have more redness, swelling, or pain around your incision area or if you have pain that does not get better with medicine.  This information is not intended to replace advice given to you by your health care provider. Make sure you discuss any questions you have with your health care provider. Document Revised: 08/23/2021 Document Reviewed: 05/15/2019 Elsevier Patient Education  2023 [employer] Inc[employer]    Interventional Radiology Drain Record Empty your drain at least once per day. You may empty it as often as needed. Use this form to write down the amount of fluid that has collected in the drainage container. Bring this form with you to your follow-up visits. Please call [facility] at [phone] with any questions or concerns prior to your  appointment.  Drain #1 location: ___________________ Date __________ Time __________ Amount __________ Date __________ Time __________ Amount __________ Date __________ Time __________ Amount __________ Date __________ Time __________ Amount __________ Date __________ Time __________ Amount __________ Date __________ Time __________ Amount __________ Date __________ Time __________ Amount __________ Date __________ Time __________ Amount __________ Date __________ Time __________ Amount __________ Date __________ Time __________ Amount __________ Date __________ Time __________ Amount __________ Date __________ Time __________ Amount __________ Date __________ Time __________ Amount __________ Date __________ Time __________ Amount __________     Elenora Griffiths #2 location: ___________________ Date __________ Time __________ Amount __________ Date __________ Time __________ Amount __________ Date __________ Time __________ Amount __________ Date __________ Time __________ Amount __________ Date __________ Time __________ Amount __________ Date __________ Time __________ Amount __________ Date __________ Time __________ Amount __________ Date __________ Time __________ Amount __________ Date __________ Time __________ Amount __________ Date __________ Time __________ Amount __________ Date __________ Time __________ Amount __________ Date __________ Time __________ Amount __________ Date __________ Time __________ Amount __________ Date __________ Time __________ Amount __________

## 2023-11-14 NOTE — Plan of Care (Signed)

## 2023-11-14 NOTE — Progress Notes (Signed)
 Mobility Specialist Progress Note:    11/14/23 1102  Mobility  Activity Ambulated with assistance in room  Level of Assistance Standby assist, set-up cues, supervision of patient - no hands on  Assistive Device Cane  Distance Ambulated (ft) 172 ft  Activity Response Tolerated well  Mobility Referral Yes  Mobility visit 1 Mobility  Mobility Specialist Start Time (ACUTE ONLY) [id number]  Mobility Specialist Stop Time (ACUTE ONLY) 1001  Mobility Specialist Time Calculation (min) (ACUTE ONLY) 10 min   Pt received in chair, agreeable to mobility. C/o RLE pain, otherwise no c/o. No physical assistance required. Returned to room w/o fault. Pt left in chair. Personal belongings and call light w/in reach. All needs met.  Doak Free Mobility Specialist  Please contact via [employer] or  Rehab Office [phone]

## 2023-11-14 NOTE — Progress Notes (Signed)
 Explained discharge instructions to patient. Reviewed follow up appointment and next medication administration times. Also reviewed education. Patient verbalized having an understanding for instructions given. All belongings are in the patient's possession to include TOC meds. IV and telemetry were removed. CCMD was notified. No other needs verbalized. Volunteer to transport downstairs for discharge.

## 2023-11-14 NOTE — TOC Transition Note (Signed)
 Transition of Care [facility]) - Discharge Note   Patient Details  Name: Phillip [name] MRN: [id number] Date of Birth: September 23, 1955  Transition of Care [facility]) CM/SW Contact:  Eusebio High, RN Phone Number: 11/14/2023, 12:59 PM   Clinical Narrative:     Patient will DC to home today. No TOC needs identified. Patient will follow up as directed on AVS      Wife to transport     Final next level of care: Home/Self Care Barriers to Discharge: Continued Medical Work up   Patient Goals and CMS Choice Patient states their goals for this hospitalization and ongoing recovery are:: to return to home   Choice offered to / list presented to : NA      Discharge Placement                       Discharge Plan and Services Additional resources added to the After Visit Summary for     Discharge Planning Services: CM Consult Post Acute Care Choice: NA          DME Arranged: N/A DME Agency: NA       HH Arranged: NA HH Agency: NA        Social Drivers of Health (SDOH) Interventions SDOH Screenings   Food Insecurity: No Food Insecurity (11/12/2023)  Housing: Unknown (11/12/2023)  Transportation Needs: No Transportation Needs (11/12/2023)  Utilities: Not At Risk (11/12/2023)  Depression (PHQ2-9): Low Risk  (10/22/2018)  Financial Resource Strain: Low Risk  (09/26/2020)   Received from [facility]  Physical Activity: Inactive (09/26/2020)   Received from [facility]  Social Connections: Moderately Integrated (11/12/2023)  Stress: No Stress Concern Present (09/26/2020)   Received from [facility]  Tobacco Use: Medium Risk (11/12/2023)  Health Literacy: Low Risk  (09/26/2020)   Received from [facility]     Readmission Risk Interventions    08/13/2023    4:28 PM  Readmission Risk Prevention Plan  Transportation Screening Complete  HRI or Home Care Consult Complete  Palliative Care Screening Not Applicable  Medication Review (RN Care Manager) Complete

## 2023-11-14 NOTE — Progress Notes (Addendum)
 [facility] Progress Note   Subjective: Seen sitting up at window.     Objective Vitals:   11/13/23 2000 11/13/23 2101 11/14/23 0522 11/14/23 0747  BP: 96/63 103/60 106/67 114/72  Pulse: 61 76 77 86  Resp: (!) 25 16  17   Temp:  99 F (37.2 C) 98.6 F (37 C) 98.1 F (36.7 C)  TempSrc:  Oral Oral Oral  SpO2: (!) 65% 97% 93% 95%  Weight:      Height:       Physical Exam General: Pleasant older male in NAD Heart: S1,S2 RRR no M/R/G Lungs: CTAB Abdomen: NABS RUQ drain, drsg intact Extremities: No LE edema Dialysis Access: L AVF + T/B RIJ TDC drsg intact   Additional Objective Labs: Basic Metabolic Panel: Recent Labs  Lab 11/12/23 0631 11/13/23 0652 11/14/23 0552  NA 134* 135 135  K 3.7 4.7 4.1  CL 96* 94* 95*  CO2 24 22 25   GLUCOSE 85 175* 112*  BUN 35* 61* 35*  CREATININE 5.52* 7.54* 5.48*  CALCIUM  7.8* 8.0* 8.4*   Liver Function Tests: Recent Labs  Lab 11/11/23 1930 11/12/23 0631 11/13/23 0821  AST 54* 49* 187*  ALT 51* 42 97*  ALKPHOS 158* 134* 123  BILITOT 4.1* 4.8* 2.4*  PROT 8.0 7.0 8.1  ALBUMIN  3.2* 2.7* 2.7*   Recent Labs  Lab 11/11/23 1930  LIPASE 45   CBC: Recent Labs  Lab 11/11/23 1930 11/12/23 0631 11/13/23 0652 11/14/23 0552  WBC 12.1* 8.1 6.3 6.8  NEUTROABS 10.4*  --   --   --   HGB 12.8* 10.8* 11.6* 11.4*  HCT 38.8* 32.9* 36.8* 35.1*  MCV 94.2 93.2 94.4 93.4  PLT 81* 76* 85* 95*   Blood Culture    Component Value Date/Time   SDES BILE 11/12/2023 1247   SPECREQUEST NONE 11/12/2023 1247   CULT  11/12/2023 1247    CULTURE REINCUBATED FOR BETTER GROWTH Performed at [facility], [address] [place], [place] [address]    REPTSTATUS PENDING 11/12/2023 1247    Cardiac Enzymes: No results for input(s): CKTOTAL, CKMB, CKMBINDEX, TROPONINI in the last 168 hours. CBG: Recent Labs  Lab 11/13/23 1148 11/13/23 2103 11/14/23 0030 11/14/23 0523 11/14/23 0749  GLUCAP 195* 82 110* 115* 108*    Iron  Studies: No results for input(s): IRON , TIBC, TRANSFERRIN, FERRITIN in the last 72 hours. @lablastinr3 @ Studies/Results: IR Perc Cholecystostomy Result Date: 11/12/2023 INDICATION: Acute cholecystitis EXAM: Percutaneous cholecystostomy MEDICATIONS: The antibiotic was administered within an appropriate time frame prior to the initiation of the procedure. ANESTHESIA/SEDATION: Moderate (conscious) sedation was employed during this procedure. A total of Versed  0.5 mg and Fentanyl  50 mcg was administered intravenously. Moderate Sedation Time: 10 minutes. The patient's level of consciousness and vital signs were monitored continuously by radiology nursing throughout the procedure under my direct supervision. FLUOROSCOPY TIME:  Fluoroscopy Time: 0 minutes 30 seconds (13 mGy). COMPLICATIONS: None immediate. PROCEDURE: Informed written consent was obtained from the patient after a thorough discussion of the procedural risks, benefits and alternatives. All questions were addressed. Maximal Sterile Barrier Technique was utilized including caps, mask, sterile gowns, sterile gloves, sterile drape, hand hygiene and skin antiseptic. A timeout was performed prior to the initiation of the procedure. The right upper quadrant was prepped and draped in usual sterile fashion. Local anesthetic was obtained with 1% lidocaine . Using real-time ultrasound guidance, an 18 gauge needle was advanced into the gallbladder. A stiff wire was placed. Serial dilatation was performed and  a 10 French percutaneous drainage catheter was placed within the gallbladder. Approximately 20 cc of bilious fluid was removed and submitted for laboratory analysis. The tube was secured in place using monofilament suture material and a dressing was applied. A gentle hand injection of contrast agent confirm placement within the gall bladder. There was no evidence of immediate postprocedure complication. IMPRESSION: 1. Percutaneous cholecystostomy  tube placement. Electronically Signed   By: Reagan Camera M.D.   On: 11/12/2023 14:43   Medications:  piperacillin -tazobactam (ZOSYN )  IV 2.25 g (11/14/23 0501)    aspirin  EC  81 mg Oral Daily   carvedilol   3.125 mg Oral BID   Chlorhexidine  Gluconate Cloth  6 each Topical Daily   Chlorhexidine  Gluconate Cloth  6 each Topical Q0600   cyanocobalamin   500 mcg Oral Daily   insulin  aspart  0-5 Units Subcutaneous QHS   insulin  aspart  0-9 Units Subcutaneous TID WC   isosorbide  mononitrate  120 mg Oral Daily   levothyroxine   50 mcg Oral Q0600   sodium chloride  flush  5 mL Intracatheter Q8H   tamsulosin   0.4 mg Oral QPC breakfast   HD orders: [employer] Have requested records [employer] [phone]   Assessment/Plan:   # Acute cholecystitis with transaminitis status post percutaneous cholecystostomy tube placed by IR on 6/11.  Management per IR and surgery team.   # ESRD: TTS. Next HD 11/15/2023.  Use right IJ for the access.  UF as tolerated.     # Hypertension: Monitor BP, resume home medication   # Anemia of ESRD: Hemoglobin at goal, no need for erythropoietin.   # Metabolic Bone Disease: Monitor calcium  phosphorus. PO4 added to today's labs   Thank you for the consult, we will continue to follow.  Keilah Lemire H. Daneshia Tavano NP-C 11/14/2023, 9:43 AM  [employer] [phone]

## 2023-11-14 NOTE — Discharge Summary (Signed)
 Physician Discharge Summary   Patient: Phillip [name] MRN: [id number] DOB: 01/03/1956  Admit date:     11/11/2023  Discharge date: {dischdate:26783}  Discharge Physician: Aisha Hove   PCP: Abbe Hoard., MD   Recommendations at discharge:  {Tip this will not be part of the note when signed- Example include specific recommendations for outpatient follow-up, pending tests to follow-up on. (Optional):26781}  ***  Discharge Diagnoses: Principal Problem:   Choledocholithiasis  Resolved Problems:   * No resolved hospital problems. [facility] Course: No notes on file  Assessment and Plan: No notes have been filed under this hospital service. Service: Hospitalist     {Tip this will not be part of the note when signed Body mass index is 24.99 kg/m. , ,  (Optional):26781}  {(NOTE) Pain control PDMP Statment (Optional):26782} Consultants: *** Procedures performed: ***  Disposition: {Plan; Disposition:26390} Diet recommendation:  Discharge Diet Orders (From admission, onward)     Start     Ordered   11/14/23 0000  Diet - low sodium heart healthy        11/14/23 1236           {Diet_Plan:26776} DISCHARGE MEDICATION: Allergies as of 11/14/2023   No Known Allergies      Medication List     TAKE these medications    acetaminophen  500 MG tablet Commonly known as: TYLENOL  Take 1,000 mg by mouth every 6 (six) hours as needed for moderate pain or headache.   amoxicillin -clavulanate 500-125 MG tablet Commonly known as: Augmentin  Take 1 tablet by mouth daily for 10 days.   aspirin  EC 81 MG tablet Take 1 tablet (81 mg total) by mouth daily. Swallow whole.   atorvastatin  20 MG tablet Commonly known as: LIPITOR Take 1 tablet (20 mg total) by mouth daily.   carvedilol  3.125 MG tablet Commonly known as: COREG  Take 1 tablet (3.125 mg total) by mouth 2 (two) times daily.   cyanocobalamin  500 MCG tablet Commonly known as: VITAMIN B12 Take 500 mcg by  mouth daily.   gabapentin  300 MG capsule Commonly known as: NEURONTIN  Take 1 capsule (300 mg total) by mouth at bedtime. What changed: when to take this   insulin  glargine 100 UNIT/ML injection Commonly known as: LANTUS  Inject 7 Units into the skin daily as needed (Diabetes). Inject 7 units at bedtime as needed after checking glucose   isosorbide  mononitrate 120 MG 24 hr tablet Commonly known as: IMDUR  Take 1 tablet (120 mg total) by mouth daily.   latanoprost  0.005 % ophthalmic solution Commonly known as: XALATAN  Place 1 drop into both eyes at bedtime.   levothyroxine  50 MCG tablet Commonly known as: SYNTHROID  Take 50 mcg by mouth daily.   nitroGLYCERIN  0.4 MG SL tablet Commonly known as: NITROSTAT  Place 1 tablet (0.4 mg total) under the tongue every 5 (five) minutes as needed for chest pain.   Normal Saline Flush 0.9 % Soln Inject 5 mLs into the vein daily for 15 days. flush drain daily with 5 cc NS, record output daily, dressing changes every 2-3 days or earlier if soiled   tamsulosin  0.4 MG Caps capsule Commonly known as: FLOMAX  Take 0.4 mg by mouth daily after breakfast.   Vitamin D (Ergocalciferol) 1.25 MG (50000 UNIT) Caps capsule Commonly known as: DRISDOL Take 50,000 Units by mouth once a week. Mondays               Discharge Care Instructions  (From admission, onward)  Start     Ordered   11/14/23 0000  Discharge wound care:       Comments: flush drain QD with 5 cc NS, record output QD, dressing changes every 2-3 days or earlier if soiled   11/14/23 1236            Follow-up Information     Marilee Showers, MD .   Specialty: Sports Medicine Contact information: [address] [place] [address] [phone]         Lujean Sake, MD Follow up on 01/12/2024.   Specialty: General Surgery Why: 1020am. Please bring a copy of your photo ID, insurance card and arrive 30 minutes prior to your appointment for  paperwork. Contact information: [address] [place] [address] [phone]         Ardia Kraft, PA-C. Go on 12/11/2023.   Specialties: Cardiology, Radiology Why: @ 2:45 pm, please arrive 15 minutes early Contact information: [address] [place] [address] [phone]         RevankarMicael Adas, MD. Go on 11/26/2023.   Specialty: Cardiology Why: @ 11 am. please arrive at least 15 minutes early. Contact information: [address] [place] [address] [phone]         [facility] Follow up.   Why: Please follow up with Interventional Radiology in approximately 6 weeks for a drain evaluation, cholangiogram and possible drain exchange. A scheduler from our office will call you with a date/time of your appointment. Please call our office with any questions/concerns prior to your appointment. Contact information: [address] [place] [address] [phone]                Discharge Exam: Cleavon Curls Weights   11/11/23 1915 11/12/23 0122 11/13/23 1519  Weight: 83.5 kg 84 kg 85.9 kg   ***  Condition at discharge: {DC Condition:26389}  The results of significant diagnostics from this hospitalization (including imaging, microbiology, ancillary and laboratory) are listed below for reference.   Imaging Studies: IR Perc Cholecystostomy Result Date: 11/12/2023 INDICATION: Acute cholecystitis EXAM: Percutaneous cholecystostomy MEDICATIONS: The antibiotic was administered within an appropriate time frame prior to the initiation of the procedure. ANESTHESIA/SEDATION: Moderate (conscious) sedation was employed during this procedure. A total of Versed  0.5 mg and Fentanyl  50 mcg was administered intravenously. Moderate Sedation Time: 10 minutes. The patient's level of consciousness and vital signs were monitored continuously by radiology nursing throughout the procedure under my direct supervision. FLUOROSCOPY TIME:   Fluoroscopy Time: 0 minutes 30 seconds (13 mGy). COMPLICATIONS: None immediate. PROCEDURE: Informed written consent was obtained from the patient after a thorough discussion of the procedural risks, benefits and alternatives. All questions were addressed. Maximal Sterile Barrier Technique was utilized including caps, mask, sterile gowns, sterile gloves, sterile drape, hand hygiene and skin antiseptic. A timeout was performed prior to the initiation of the procedure. The right upper quadrant was prepped and draped in usual sterile fashion. Local anesthetic was obtained with 1% lidocaine . Using real-time ultrasound guidance, an 18 gauge needle was advanced into the gallbladder. A stiff wire was placed. Serial dilatation was performed and a 10 French percutaneous drainage catheter was placed within the gallbladder. Approximately 20 cc of bilious fluid was removed and submitted for laboratory analysis. The tube was secured in place using monofilament suture material and a dressing was applied. A gentle hand injection of contrast agent confirm placement within the gall bladder. There was no evidence of immediate  postprocedure complication. IMPRESSION: 1. Percutaneous cholecystostomy tube placement. Electronically Signed   By: Reagan Camera M.D.   On: 11/12/2023 14:43   MR ABDOMEN MRCP W WO CONTAST Result Date: 11/12/2023 CLINICAL DATA:  Cholelithiasis on ultrasound. EXAM: MRI ABDOMEN WITHOUT AND WITH CONTRAST (INCLUDING MRCP) TECHNIQUE: Multiplanar multisequence MR imaging of the abdomen was performed both before and after the administration of intravenous contrast. Heavily T2-weighted images of the biliary and pancreatic ducts were obtained, and three-dimensional MRCP images were rendered by post processing. CONTRAST:  8mL GADAVIST  GADOBUTROL  1 MMOL/ML IV SOLN COMPARISON:  Ultrasound exam from 1 day prior. CTA abdomen/pelvis 10/10/2023. FINDINGS: Lower chest: No acute findings. Hepatobiliary: Markedly low signal  intensity identified in the liver parenchyma on T2 imaging with paradoxical increased signal intensity on out of phase T1 weighted imaging, features compatible with iron  deposition disease. No focal suspicious mass lesion within the liver parenchyma. Gallbladder is distended and innumerable tiny 1-3 mm layering gallstones there are evident. The gallbladder wall is thickened and markedly irregular in appearance with associated pericholecystic edema. There is hyperemia in the liver parenchyma adjacent to the gallbladder fossa. No substantial intrahepatic biliary duct dilatation. The common duct is attenuated by mass effect as it passes adjacent to the gallbladder neck through the hepatoduodenal ligament. Common bile duct in the head of the pancreas is nondilated measuring only 3 mm diameter. No definite MR evidence for choledocholithiasis. Pancreas: No focal mass lesion. No dilatation of the main duct. No intraparenchymal cyst. No peripancreatic edema. Spleen:  No splenomegaly. No suspicious focal mass lesion. Adrenals/Urinary Tract: No adrenal nodule or mass. Kidneys unremarkable. Stomach/Bowel: Stomach is unremarkable. No gastric wall thickening. No evidence of outlet obstruction. Duodenum is normally positioned as is the ligament of Treitz. No small bowel or colonic dilatation within the visualized abdomen. Vascular/Lymphatic: No abdominal aortic aneurysm. Portal vein, superior mesenteric vein, and splenic vein are patent. There is no gastrohepatic or hepatoduodenal ligament lymphadenopathy. No retroperitoneal or mesenteric lymphadenopathy. Other:  No substantial intraperitoneal free fluid. Musculoskeletal: No focal suspicious marrow enhancement within the visualized bony anatomy. IMPRESSION: 1. Distended gallbladder with innumerable tiny 1-3 mm layering gallstones. The gallbladder wall is thickened and markedly irregular in appearance with associated pericholecystic edema. Hyperemia noted in the subcapsular liver  parenchyma adjacent to the gallbladder fossa. Imaging features compatible with acute cholecystitis. 2. While there is no substantial intrahepatic biliary duct dilatation, central biliary anatomy in the region of the biliary duct confluence passes through the hyperemic liver parenchyma. Component of ascending cholangitis in this region cannot be excluded. 3. The common duct appears attenuated by mass effect from the edema/inflammation as it passes adjacent to the gallbladder neck through the hepatoduodenal ligament. Common bile duct in the head of the pancreas is nondilated measuring only 3 mm diameter. No definite MR evidence for choledocholithiasis. 4. Markedly low signal intensity in the liver parenchyma on T2 imaging with paradoxical increased signal intensity on out of phase T1 weighted imaging, features compatible with iron  deposition disease. These results will be called to the ordering clinician or representative by the Radiologist Assistant, and communication documented in the PACS or [employer]. Electronically Signed   By: Donnal Fusi M.D.   On: 11/12/2023 05:17   US  Abdomen Limited RUQ (LIVER/GB) Result Date: 11/11/2023 CLINICAL DATA:  151471 RUQ pain 151471 EXAM: ULTRASOUND ABDOMEN LIMITED RIGHT UPPER QUADRANT COMPARISON:  None Available. FINDINGS: Gallbladder: No gallstones or wall thickening visualized. No sonographic Murphy sign noted by sonographer. Common bile duct: Diameter: 4 mm Liver:  No focal lesion identified. Within normal limits in parenchymal echogenicity. Portal vein is patent on color Doppler imaging with normal direction of blood flow towards the liver. Other: None. IMPRESSION: Cholelithiasis with no acute cholecystitis. Electronically Signed   By: Morgane  Naveau M.D.   On: 11/11/2023 21:17   DG Chest Port 1 View Result Date: 11/11/2023 CLINICAL DATA:  Possible sepsis EXAM: PORTABLE CHEST 1 VIEW COMPARISON:  08/19/2023 FINDINGS: Cardiac shadow is enlarged but stable.  Postsurgical changes are again seen. Right jugular dialysis catheter is noted. The lungs are well aerated. Elevation of the right hemidiaphragm is seen. No focal infiltrate or effusion is noted. No bony abnormality is seen. IMPRESSION: No active disease. Electronically Signed   By: Violeta Grey M.D.   On: 11/11/2023 20:50    Microbiology: Results for orders placed or performed during the hospital encounter of 11/11/23  Blood Culture (routine x 2)     Status: None (Preliminary result)   Collection Time: 11/11/23  7:30 PM   Specimen: BLOOD  Result Value Ref Range Status   Specimen Description BLOOD RIGHT ANTECUBITAL  Final   Special Requests   Final    BOTTLES DRAWN AEROBIC AND ANAEROBIC Blood Culture adequate volume   Culture   Final    NO GROWTH 3 DAYS Performed at [facility], [address], [place], [place] [address]    Report Status PENDING  Incomplete  Resp panel by RT-PCR (RSV, Flu A&B, Covid) Anterior Nasal Swab     Status: None   Collection Time: 11/11/23  8:22 PM   Specimen: Anterior Nasal Swab  Result Value Ref Range Status   SARS Coronavirus 2 by RT PCR NEGATIVE NEGATIVE Final   Influenza A by PCR NEGATIVE NEGATIVE Final   Influenza B by PCR NEGATIVE NEGATIVE Final    Comment: (NOTE) The Xpert Xpress SARS-CoV-2/FLU/RSV plus assay is intended as an aid in the diagnosis of influenza from Nasopharyngeal swab specimens and should not be used as a sole basis for treatment. Nasal washings and aspirates are unacceptable for Xpert Xpress SARS-CoV-2/FLU/RSV testing.  Fact Sheet for Patients: BloggerCourse.com  Fact Sheet for Healthcare Providers: SeriousBroker.it  This test is not yet approved or cleared by the [place]  FDA and has been authorized for detection and/or diagnosis of SARS-CoV-2 by FDA under an Emergency Use Authorization (EUA). This EUA will remain in effect (meaning this test can be used) for the  duration of the COVID-19 declaration under Section 564(b)(1) of the Act, 21 U.S.C. section 360bbb-3(b)(1), unless the authorization is terminated or revoked.     Resp Syncytial Virus by PCR NEGATIVE NEGATIVE Final    Comment: (NOTE) Fact Sheet for Patients: BloggerCourse.com  Fact Sheet for Healthcare Providers: SeriousBroker.it  This test is not yet approved or cleared by the [place]  FDA and has been authorized for detection and/or diagnosis of SARS-CoV-2 by FDA under an Emergency Use Authorization (EUA). This EUA will remain in effect (meaning this test can be used) for the duration of the COVID-19 declaration under Section 564(b)(1) of the Act, 21 U.S.C. section 360bbb-3(b)(1), unless the authorization is terminated or revoked.  Performed at [facility], [address] [place], [place] [address]   Blood Culture (routine x 2)     Status: None (Preliminary result)   Collection Time: 11/11/23  8:25 PM   Specimen: BLOOD RIGHT WRIST  Result Value Ref Range Status   Specimen Description BLOOD RIGHT WRIST  Final   Special Requests  Final    BOTTLES DRAWN AEROBIC AND ANAEROBIC Blood Culture adequate volume   Culture   Final    NO GROWTH 3 DAYS Performed at [facility], [address] [place], [place] [address]    Report Status PENDING  Incomplete  Body fluid culture w Gram Stain     Status: None   Collection Time: 11/12/23 12:47 PM   Specimen: BILE; Body Fluid  Result Value Ref Range Status   Specimen Description BILE  Final   Special Requests NONE  Final   Gram Stain   Final    NO WBC SEEN FEW GRAM POSITIVE COCCI IN CLUSTERS RARE GRAM POSITIVE COCCI IN CHAINS Performed at [facility], [address], [place], [place] [address]    Culture MODERATE STREPTOCOCCUS SALIVARIUS  Final   Report Status 11/14/2023 FINAL  Final   Organism ID, Bacteria STREPTOCOCCUS SALIVARIUS  Final      Susceptibility    Streptococcus salivarius - MIC*    PENICILLIN 1 INTERMEDIATE Intermediate     CEFTRIAXONE  0.25 SENSITIVE Sensitive     ERYTHROMYCIN <=0.12 SENSITIVE Sensitive     LEVOFLOXACIN 4 INTERMEDIATE Intermediate     VANCOMYCIN  1 SENSITIVE Sensitive     * MODERATE STREPTOCOCCUS SALIVARIUS    Labs: CBC: Recent Labs  Lab 11/11/23 1930 11/12/23 0631 11/13/23 0652 11/14/23 0552  WBC 12.1* 8.1 6.3 6.8  NEUTROABS 10.4*  --   --   --   HGB 12.8* 10.8* 11.6* 11.4*  HCT 38.8* 32.9* 36.8* 35.1*  MCV 94.2 93.2 94.4 93.4  PLT 81* 76* 85* 95*   Basic Metabolic Panel: Recent Labs  Lab 11/11/23 1930 11/12/23 0631 11/13/23 0652 11/14/23 0552  NA 134* 134* 135 135  K 3.8 3.7 4.7 4.1  CL 94* 96* 94* 95*  CO2 25 24 22 25   GLUCOSE 148* 85 175* 112*  BUN 31* 35* 61* 35*  CREATININE 4.93* 5.52* 7.54* 5.48*  CALCIUM  8.1* 7.8* 8.0* 8.4*   Liver Function Tests: Recent Labs  Lab 11/11/23 1930 11/12/23 0631 11/13/23 0821  AST 54* 49* 187*  ALT 51* 42 97*  ALKPHOS 158* 134* 123  BILITOT 4.1* 4.8* 2.4*  PROT 8.0 7.0 8.1  ALBUMIN  3.2* 2.7* 2.7*   CBG: Recent Labs  Lab 11/13/23 2103 11/14/23 0030 11/14/23 0523 11/14/23 0749 11/14/23 1209  GLUCAP 82 110* 115* 108* 108*    Discharge time spent: {LESS THAN/GREATER THAN:26388} 30 minutes.  Signed: Aisha Hove, MD [facility] 11/14/2023

## 2023-11-15 DIAGNOSIS — N186 End stage renal disease: Secondary | ICD-10-CM

## 2023-11-15 DIAGNOSIS — K810 Acute cholecystitis: Secondary | ICD-10-CM

## 2023-11-15 DIAGNOSIS — A419 Sepsis, unspecified organism: Secondary | ICD-10-CM

## 2023-11-15 HISTORY — DX: Acute cholecystitis: K81.0

## 2023-11-15 HISTORY — DX: Sepsis, unspecified organism: A41.9

## 2023-11-15 HISTORY — DX: End stage renal disease: N18.6

## 2023-11-15 LAB — HEPATITIS B SURFACE ANTIBODY, QUANTITATIVE: Hep B S AB Quant (Post): <3.5 m[IU]/mL — ABNORMAL LOW

## 2023-11-16 LAB — CULTURE, BLOOD (ROUTINE X 2)
Culture: NO GROWTH
Culture: NO GROWTH
Special Requests: ADEQUATE
Special Requests: ADEQUATE

## 2023-11-18 ENCOUNTER — Inpatient Hospital Stay (HOSPITAL_COMMUNITY): Admission: RE | Admit: 2023-11-18 | Source: Home / Self Care | Admitting: Internal Medicine

## 2023-11-18 ENCOUNTER — Encounter (HOSPITAL_COMMUNITY): Admission: RE | Payer: Self-pay | Source: Home / Self Care

## 2023-11-18 DIAGNOSIS — I350 Nonrheumatic aortic (valve) stenosis: Secondary | ICD-10-CM

## 2023-11-18 SURGERY — TRANSCATHETER AORTIC VALVE REPLACEMENT, TRANSFEMORAL (CATHLAB)
Anesthesia: Monitor Anesthesia Care

## 2023-11-21 ENCOUNTER — Telehealth: Payer: Self-pay

## 2023-11-21 NOTE — Telephone Encounter (Cosign Needed)
 Spoke to patient's wife via phone.  She called IR APP office with concern that patient's output had increased from approximately 40 mL/day from cholecystostomy tube to 80 mL in the past 24 hours.  Patient denies fever or chills, abdominal pain, nausea, vomiting, blood in collection bag, leakage around insertion site. No change in appearance of output. Encouraged patient's wife that no red flag signs identified in her report.  Plan made with patient's wife to continue her excellent work with dressing changes, recording output, and monitoring patient's status.  She will call back if output continues to increase significantly or patient develops any of the above-mentioned symptoms.  Discussed with her that given patient receives hemodialysis that she should have a low threshold for presenting to the ER with fever, chills, abdominal pain resembling cholecystitis symptoms (if the symptoms occur outside of hours IR triage line is available).

## 2023-11-25 DIAGNOSIS — D696 Thrombocytopenia, unspecified: Secondary | ICD-10-CM | POA: Insufficient documentation

## 2023-11-25 DIAGNOSIS — L97529 Non-pressure chronic ulcer of other part of left foot with unspecified severity: Secondary | ICD-10-CM | POA: Insufficient documentation

## 2023-11-25 DIAGNOSIS — I272 Pulmonary hypertension, unspecified: Secondary | ICD-10-CM | POA: Insufficient documentation

## 2023-11-25 DIAGNOSIS — I350 Nonrheumatic aortic (valve) stenosis: Secondary | ICD-10-CM | POA: Insufficient documentation

## 2023-11-25 DIAGNOSIS — I700 Atherosclerosis of aorta: Secondary | ICD-10-CM | POA: Insufficient documentation

## 2023-11-25 DIAGNOSIS — F1291 Cannabis use, unspecified, in remission: Secondary | ICD-10-CM | POA: Insufficient documentation

## 2023-11-25 DIAGNOSIS — I10 Essential (primary) hypertension: Secondary | ICD-10-CM | POA: Insufficient documentation

## 2023-11-26 ENCOUNTER — Ambulatory Visit: Attending: Cardiology | Admitting: Cardiology

## 2023-12-04 ENCOUNTER — Ambulatory Visit: Admitting: Physician Assistant

## 2023-12-11 ENCOUNTER — Ambulatory Visit: Admitting: Physician Assistant

## 2023-12-14 NOTE — Progress Notes (Addendum)
 [facility]   MULTIDISCIPLINARY HEART VALVE CLINIC                                     Cardiology Office Note:    Date:  01/01/2024   ID:  Phillip [name], DOB [birth year], MRN [id number]  PCP:  [name] LABOR., MD  [facility] Cardiologist:  Jennifer JONELLE Crape, MD  [facility] Structural heart: Lurena MARLA Red, MD [facility] Electrophysiologist:  None   Referring MD: [name] LABOR., MD   Follow up severe AS- discuss upcoming TAVR  History of Present Illness:    Phillip [name] is a 68 y.o. male with a hx of HTN, HLD, ESRD on HD (T,Th,Sat started March 2025), PAD s/p left tibioperoneal, peroneal, and popliteal drug-coated balloon angioplasty 09/2022 and left big toe amputation, obesity (BMI 31), DMT2, HFrEF (EF 30-35%, IDDM, CAD s/p CABG x4 in 2003 (all vein grafts occluded; LIMA to LAD patent cath 2025), admission for symptomatic cholelithiasis (lap chole deferred until after valve replacement), and severe AS who presents to clinic for follow up.   An echocardiogram in 06/2023 (outside films) showed EF 30-35%, severe LFLG AS with mean grad 28 mmHg, Vmax 3.1 m/s, AVA 0.9cm2, SVI 23. L/RHC on 08/01/23 showed showed severe native three-vessel disease with all 3 vein grafts occluded.  There was a patent LIMA to the LAD with collaterals to the distal right coronary artery.  There was moderate pulmonary hypertension at 66/21 with a mean of 39.   He presented in March with worsening lower extremity edema and congestive heart failure. He was approaching hemodialysis and was treated with high-dose diuretics to remove volume but eventually required hemodialysis. He had a left arm fistula started as well as a right IJ dialysis catheter which he is still being dialyzed through. His graft has not matured yet.   In 09/2023 he was hospitalized for cholelithiasis and abdominal pain at [facility] and was told that he required cholecystectomy which was postponed until after his  aortic valve was treated. TAVR was set up for 11/18/23; however, he was then readmitted to [facility] 6/10-6/13/25 with sepsis secondary to acute cholecystitis s/p percutaneous drain placement and TAVR cancelled.   Today the patient presents to clinic for follow up. Here with his wife and grand-daughter. He is doing well with his HD sessions Tu, Th, Sat. No CP. He has chronic SOB but it is much more stable since starting HD. No LE edema, orthopnea or PND. No dizziness or syncope. No blood in stool or urine. No palpitations. No fevers or chills. His left third toe is painful and black. His AV fistula needs a revision.      Past Medical History:  Diagnosis Date   Abdominopelvic abscess (HCC) 09/29/2018   Abscess of appendix 09/22/2018   Acquired spondylolisthesis of lumbosacral region 03/20/2020   Acute cholecystitis 11/15/2023   Acute kidney injury (HCC) 10/07/2018   Acute kidney injury superimposed on chronic kidney disease (HCC) 11/06/2020   Acute kidney injury superimposed on CKD (HCC) 02/18/2020   Acute on chronic systolic (congestive) heart failure (HCC) 07/26/2015   Formatting of this note might be different from the original. Formatting of this note might be different from the original. Formatting of this note might be different from the original. revenkar EF 35% Formatting of this note might be different from the original. revenkar EF 35%  Acute on chronic systolic CHF (congestive heart failure) (HCC) 08/12/2023   Anasarca 07/01/2023   Anemia of chronic disease 08/18/2019   Anemia, chronic disease 08/18/2019   Aortic atherosclerosis (HCC)    Aortic stenosis    a.) TTE 09/15/2018: mild-mod (MPG 19); b.) TTE 03/21/2021: mild-mod (MPG 14.3); c.) TTE 04/24/2022: mild- mod (MPG 15)   Aortic stenosis, moderate 10/15/2019   Appendicitis 09/08/2018   ARF (acute renal failure) (HCC) 02/19/2020   Asthma    Atherosclerosis of native arteries of the extremities with ulceration (HCC) 02/24/2018    Formatting of this note might be different from the original.  Last Assessment & Plan:   His ABIs today are 1.07 on the right and 0.99 on the left with biphasic waveforms.  Although these pressures may be somewhat elevated from medial calcification, his flow does appear to be reasonably good.  His left ABI was 0.58 prior to intervention.  At this point, we will stretch out his follow-up and see hi   Benign hypertension with CKD (chronic kidney disease) stage IV (HCC) 02/24/2018   Benign prostatic hyperplasia without lower urinary tract symptoms 01/14/2018   Bilateral lower extremity edema 11/04/2018   Bradycardia 03/25/2018   Bruit of right carotid artery 07/26/2015   CAD (coronary artery disease) 2003   a.) s/p 4v CABG 2003   Cardiac murmur 07/26/2015   Choledocholithiasis 11/12/2023   Chronic combined systolic and diastolic CHF (congestive heart failure) (HCC) 07/26/2015   a.) TTE 09/15/2018: EF 50-55%, mod LVH, RVE, BAE, mild-mod TR, AoV sclerosis, G1DD; b.) MPI 10/27/2019: EF <30%; c.) TTE 03/21/2021: EF 35-40%, post AK, inf HK, mod LVH, mod red RVSF, mod LAE, mod Aov sclerosis, G2DD; c.) TTE 04/24/2022: EF 35-40%, post AK, glok HK, mod LVE, mod red RVSF, mild-mod MR, Aov sclerosis, G3DD   Chronic idiopathic constipation 11/02/2018   Chronic low back pain without sciatica 12/02/2018   Chronic pain of right hip 03/31/2020   Chronic systolic heart failure (HCC) 07/26/2015   Formatting of this note might be different from the original. revenkar EF 35%     CKD (chronic kidney disease) stage 4, GFR 15-29 ml/min (HCC) 10/07/2018   Congestive heart failure (HCC) 07/26/2015   Last Assessment & Plan:   Poor cardiac function could certainly be contributing to poor blood supply to the feet and toes as well.     Formatting of this note might be different from the original.  Formatting of this note might be different from the original.  Last Assessment & Plan:   Poor cardiac function could certainly be  contributing to poor blood supply to the feet and toes as well.     Last   Coronary artery disease    Diabetes mellitus without complication (HCC)    Dysphagia 07/26/2015   Encounter for long-term (current) use of insulin  (HCC) 09/27/2020   Erectile dysfunction 07/14/2017   ESRD on dialysis [facility]) 11/15/2023   Essential hypertension 02/24/2018   Fever 11/07/2020   Foot ulcer (HCC) 09/27/2020   left foot  Formatting of this note might be different from the original.  Formatting of this note might be different from the original.  left foot     Foot ulcer, left (HCC)    GERD (gastroesophageal reflux disease)    Heart murmur 07/26/2015   Hematuria 07/27/2015   High risk medication use 07/26/2015   History of marijuana use    Hyperkalemia 05/29/2020   Hyperlipidemia 07/26/2015   Hyperlipidemia associated with type 2 diabetes  mellitus (HCC) 02/18/2020   Hypertension    Hypertension associated with diabetes (HCC)    Hypoglycemia 05/07/2019   Hypothyroidism 11/14/2015   Hypothyroidism (acquired) 11/14/2015   Insomnia 11/02/2018   Kidney insufficiency    Lumbar spondylosis 03/20/2020   Malaise and fatigue 03/25/2018   Malnutrition of moderate degree 08/20/2023   Mitral regurgitation 05/12/2019   Moderate aortic regurgitation 05/12/2019   Myocardial infarction due to demand ischemia (HCC) 09/26/2020   a.) Type II NSTEMI; b.) troponins were trended 0.54 --> 0.56 --> 0.52 ng/mL   Non-compliance 05/05/2019   NSTEMI (non-ST elevated myocardial infarction) (HCC) 09/27/2020   Osseous and subluxation stenosis of intervertebral foramina of lumbar region 03/20/2020   Osteoarthritis 10/06/2019   Pancytopenia (HCC) 08/12/2023   Peripheral vascular disease (HCC)    Personal history of tobacco use, presenting hazards to health 09/27/2020   Polyneuropathy in diabetes (HCC) 05/07/2019   Primary osteoarthritis involving multiple joints 10/06/2019   Primary osteoarthritis of right hip 02/21/2020    Pulmonary HTN (HCC)    a.) TTE 05/12/2019: PASP 71; b.) TTE 09/27/2020: PASP >70; c.) TTE 03/21/2021: RVSP 43; d.) TTE 04/24/2022: RVSP 80.6   Puncture wound of right hip 04/02/2018   PVD (peripheral vascular disease) with claudication (HCC)    a.) s/p PTA 03/02/2018 - balloon angioplasty LEFT below knee popliteal artery; b.) s/p PTA 09/30/2022: baloon angioplasty LEFT tibioperoneal trunck, most proximal peroneal artery, and LEFT popliteal artery.   Regional wall motion abnormality of heart 05/07/2019   Right groin pain 02/18/2020   S/P CABG x 4 2003   Screening for colon cancer 02/18/2018   Sepsis (HCC) 11/15/2023   Sleep apnea 07/26/2015   a.) does not require nocturnal PAP therapy   Subacute osteomyelitis of left foot (HCC) 03/25/2018   Thrombocytopenia (HCC)    Type 2 diabetes mellitus with hyperlipidemia (HCC) 07/26/2015   Type 2 diabetes mellitus with stage 4 chronic kidney disease, with long-term current use of insulin  (HCC) 07/26/2015   Type II diabetes mellitus with neurological manifestations (HCC) 02/18/2020   Vitamin B12 deficiency 06/16/2019   Vitamin D deficiency 05/07/2018     Current Medications: Current Meds  Medication Sig   acetaminophen  (TYLENOL ) 500 MG tablet Take 1,000 mg by mouth every 6 (six) hours as needed for moderate pain or headache.    aspirin  EC 81 MG tablet Take 1 tablet (81 mg total) by mouth daily. Swallow whole.   atorvastatin  (LIPITOR) 20 MG tablet Take 1 tablet (20 mg total) by mouth daily.   carvedilol  (COREG ) 3.125 MG tablet Take 1 tablet (3.125 mg total) by mouth 2 (two) times daily.   gabapentin  (NEURONTIN ) 300 MG capsule Take 1 capsule (300 mg total) by mouth at bedtime. (Patient taking differently: Take 300 mg by mouth 2 (two) times daily.)   insulin  glargine (LANTUS ) 100 UNIT/ML injection Inject 7 Units into the skin daily as needed (Diabetes). Inject 7 units at bedtime as needed after checking glucose   isosorbide  mononitrate (IMDUR ) 120 MG  24 hr tablet Take 1 tablet (120 mg total) by mouth daily.   latanoprost  (XALATAN ) 0.005 % ophthalmic solution Place 1 drop into both eyes at bedtime.   levothyroxine  (SYNTHROID ) 50 MCG tablet Take 50 mcg by mouth daily.   nitroGLYCERIN  (NITROSTAT ) 0.4 MG SL tablet Place 1 tablet (0.4 mg total) under the tongue every 5 (five) minutes as needed for chest pain.   sodium chloride  flush (NS) 0.9 % SOLN Inject 5 mLs into the vein daily for 15  days. flush drain daily with 5 cc NS, record output daily, dressing changes every 2-3 days or earlier if soiled   vitamin B-12 (CYANOCOBALAMIN ) 500 MCG tablet Take 500 mcg by mouth daily.    Vitamin D, Ergocalciferol, (DRISDOL) 1.25 MG (50000 UNIT) CAPS capsule Take 50,000 Units by mouth once a week. Mondays   Current Facility-Administered Medications for the 12/15/23 encounter (Office Visit) with Sebastian Lamarr SAUNDERS, PA-C  Medication   sodium chloride  flush (NS) 0.9 % injection 10 mL      ROS:   Please see the history of present illness.    All other systems reviewed and are negative.  EKGs       Risk Assessment/Calculations:           Physical Exam:    VS:  BP (!) 108/52   Pulse 70   Ht 6' (1.829 m)   Wt 184 lb 9.6 oz (83.7 kg)   SpO2 97%   BMI 25.04 kg/m     Wt Readings from Last 3 Encounters:  12/26/23 181 lb 4.8 oz (82.2 kg)  12/15/23 184 lb 9.6 oz (83.7 kg)  11/13/23 189 lb 6 oz (85.9 kg)     GEN: Well nourished, well developed in no acute distress NECK: No JVD CARDIAC: RRR, 3/6 harsh SEM heard best a@ RUSB. No rubs, gallops RESPIRATORY:  Clear to auscultation without rales, wheezing or rhonchi  ABDOMEN: Soft, non-tender, non-distended. percutaneous drain in place with no issues EXTREMITIES:  No edema; No deformity.  Left foot with big toe amputation and third little toe necrosis. Painful     ASSESSMENT:    1. Aortic stenosis, severe   2. HFrEF (heart failure with reduced ejection fraction) (HCC)   3. ESRD (end stage  renal disease) on dialysis (HCC)   4. Benign hypertension with CKD (chronic kidney disease) stage IV (HCC)   5. Acute cholecystitis   6. Necrotic toes (HCC)     PLAN:    In order of problems listed above:  Severe AS: -- Plan for TAVR once necrotic toe is dealt with.   HFrEF: -- Volume management per HD.  -- Appears euvolemic with NYHA class I-II symptoms.   ESRD on HD: -- T, Thu, Sat.   HTN: -- BP well controlled.  -- Continue Coreg  3.125mg  BID and Imdur  120 mg daily.  Cholecystitis: -- Stable with no fever/chills.  -- Drain in place and stable.  PAD with toe necrosis: -- He has a painful left third necrotic toe (see photo above). -- ABIs set up for next week.  -- Sent an urgent referral to [facility] to see him following ABIs next Wednesday.  -- He follows at [facility] vascular but wants to switch all care to [facility] in [place] area.  -- Continue aspirin  81mg  daily and Lipitor 20mg  daily.   ______________________  ADDENDUM: 01/01/24 9:21 AM.  The pt will require toe amputation and surgical clearance provided below.     Phillip [name]'s perioperative risk of a major cardiac event is 11% according to the Revised Cardiac Risk Index (RCRI).  Therefore, he is at high risk for perioperative complications.   His functional capacity is fair at 4.61 METs according to the Duke Activity Status Index (DASI). Recommendations: He is at high risk for surgery due to comorbidities but has decent functional ability and needs toe amputation prior to valve replacement and is at acceptable risk to proceed.  Antiplatelet and/or Anticoagulation Recommendations: Pt currently on DAPT with aspirin  and plavix  after recent  stent placement to the left tibioperoneal trunk. If these need to be held, will need to clear with Dr. Marea.      Medication Adjustments/Labs and Tests Ordered: Current medicines are reviewed at length with the patient today.  Concerns regarding medicines are outlined above.  Orders  Placed This Encounter  Procedures   Ambulatory referral to Vascular Surgery   No orders of the defined types were placed in this encounter.   Patient Instructions  Medication Instructions:  Your physician recommends that you continue on your current medications as directed. Please refer to the Current Medication list given to you today.  *If you need a refill on your cardiac medications before your next appointment, please call your pharmacy*  Lab Work: None needed If you have labs (blood work) drawn today and your tests are completely normal, you will receive your results only by: MyChart Message (if you have MyChart) OR A paper copy in the mail If you have any lab test that is abnormal or we need to change your treatment, we will call you to review the results.  Testing/Procedures: None needed  Follow-Up: At [facility], you and your health needs are our priority.  As part of our continuing mission to provide you with exceptional heart care, our providers are all part of one team.  This team includes your primary Cardiologist (physician) and Advanced Practice Providers or APPs (Physician Assistants and Nurse Practitioners) who all work together to provide you with the care you need, when you need it.  Your next appointment:   Will follow up after Podiatrist visit  We recommend signing up for the patient portal called MyChart.  Sign up information is provided on this After Visit Summary.  MyChart is used to connect with patients for Virtual Visits (Telemedicine).  Patients are able to view lab/test results, encounter notes, upcoming appointments, etc.  Non-urgent messages can be sent to your provider as well.   To learn more about what you can do with MyChart, go to [email or web address].    Signed, Lamarr Hummer, PA-C  01/01/2024 9:20 AM    [facility]

## 2023-12-15 ENCOUNTER — Other Ambulatory Visit (INDEPENDENT_AMBULATORY_CARE_PROVIDER_SITE_OTHER): Payer: Self-pay | Admitting: Podiatry

## 2023-12-15 ENCOUNTER — Ambulatory Visit: Attending: Physician Assistant | Admitting: Physician Assistant

## 2023-12-15 VITALS — BP 108/52 | HR 70 | Ht 72.0 in | Wt 184.6 lb

## 2023-12-15 DIAGNOSIS — N186 End stage renal disease: Secondary | ICD-10-CM | POA: Diagnosis not present

## 2023-12-15 DIAGNOSIS — I96 Gangrene, not elsewhere classified: Secondary | ICD-10-CM

## 2023-12-15 DIAGNOSIS — I350 Nonrheumatic aortic (valve) stenosis: Secondary | ICD-10-CM | POA: Diagnosis not present

## 2023-12-15 DIAGNOSIS — I5020 Unspecified systolic (congestive) heart failure: Secondary | ICD-10-CM

## 2023-12-15 DIAGNOSIS — I129 Hypertensive chronic kidney disease with stage 1 through stage 4 chronic kidney disease, or unspecified chronic kidney disease: Secondary | ICD-10-CM | POA: Diagnosis not present

## 2023-12-15 DIAGNOSIS — N184 Chronic kidney disease, stage 4 (severe): Secondary | ICD-10-CM

## 2023-12-15 DIAGNOSIS — Z992 Dependence on renal dialysis: Secondary | ICD-10-CM

## 2023-12-15 DIAGNOSIS — K810 Acute cholecystitis: Secondary | ICD-10-CM

## 2023-12-15 NOTE — Patient Instructions (Signed)
 Medication Instructions:  Your physician recommends that you continue on your current medications as directed. Please refer to the Current Medication list given to you today.  *If you need a refill on your cardiac medications before your next appointment, please call your pharmacy*  Lab Work: None needed If you have labs (blood work) drawn today and your tests are completely normal, you will receive your results only by: MyChart Message (if you have MyChart) OR A paper copy in the mail If you have any lab test that is abnormal or we need to change your treatment, we will call you to review the results.  Testing/Procedures: None needed  Follow-Up: At [facility], you and your health needs are our priority.  As part of our continuing mission to provide you with exceptional heart care, our providers are all part of one team.  This team includes your primary Cardiologist (physician) and Advanced Practice Providers or APPs (Physician Assistants and Nurse Practitioners) who all work together to provide you with the care you need, when you need it.  Your next appointment:   Will follow up after Podiatrist visit  We recommend signing up for the patient portal called MyChart.  Sign up information is provided on this After Visit Summary.  MyChart is used to connect with patients for Virtual Visits (Telemedicine).  Patients are able to view lab/test results, encounter notes, upcoming appointments, etc.  Non-urgent messages can be sent to your provider as well.   To learn more about what you can do with MyChart, go to [email or web address].

## 2023-12-15 NOTE — Progress Notes (Signed)
 Pre Surgical Assessment: 5 M Walk Test  30M=16.36ft  5 Meter Walk Test- trial 1: 6.9 seconds 5 Meter Walk Test- trial 2: 7.2 seconds 5 Meter Walk Test- trial 3: 6.4 seconds 5 Meter Walk Test Average: 6.8 seconds

## 2023-12-16 ENCOUNTER — Other Ambulatory Visit: Payer: Self-pay | Admitting: Surgery

## 2023-12-16 DIAGNOSIS — K8050 Calculus of bile duct without cholangitis or cholecystitis without obstruction: Secondary | ICD-10-CM

## 2023-12-17 ENCOUNTER — Ambulatory Visit: Admitting: Cardiology

## 2023-12-17 NOTE — H&P (Signed)
 [facility] INTERVENTIONAL RADIOLOGY - Pre Procedure H/P   Assessment/Plan:   Phillip [name] is a 68 y.o. male who will undergo dialysis circuit study with possible intervention in Interventional Radiology.  --This procedure has been fully reviewed with the patient/patient's authorized representative. The risks, benefits and alternatives have been explained, and the patient/patient's authorized representative has consented to the procedure. --The patient will accept blood products in an emergent situation. --The patient does not have a Do Not Resuscitate order in effect.   HPI: Phillip [name] is a 68 y.o. male with a history of ESRD on iHD (TThS)  currently with a LUE BC AVF placed 08/20/23 and RIJV tunneled HD catheter who presents for dialysis circuit study with possible intervention. Patient states they have used his graft only a handful of times and typically receives dialysis through the tunneled HD line.   On exam palpable thrill noted through the anastomosis with pulsatility in the central outflow veins.    Allergies: Allergies[1]  Medications:  ASA/plavix   ASA Grade: ASA 3 - Patient with moderate systemic disease with functional limitations  PE:   Vitals:   12/17/23 1015  BP: 100/59  Pulse: 76  Resp: 16  Temp: 36.6 C (97.8 F)  SpO2: 100%   General: male in NAD. Airway assessment: Class 2 - Can visualize soft palate and fauces, tip of uvula is obscured Lungs: Respirations nonlabored    Phillip Slates, MD VIR Resident, PGY6        [1] No Known Allergies

## 2023-12-17 NOTE — Unmapped External Note (Signed)
 [facility] INTERVENTION[facility] RADIOLOG[facility] - Operative Note   VIR Post-Procedure Note  Procedure Name: Fistulogram  Pre-Op  Diagnosis: Evaluation of LUE AVF  Post-Op Diagnosis: Same as pre-operative diagnosis  VIR Providers  Attending: Jed Cap, MD Fellow: Lela Slates, MD  Description of procedure:  -- Fistulogram of the LUE AVF demonstrated patent anastomosis, peripheral and central outflow veins. No intervention was performed. -- 56F access sheath site closed with woggle suture  Sedation: Moderate  Estimated Blood Loss: approximately <1 mL Complications: None  See detailed procedure note with images in PACS (IMPAX).  The patient tolerated the procedure well without incident or complication and left the room in stable condition.  Plan: - Woggle suture to be removed at next dialysis session  Lela Slates, MD 12/17/2023 11:24 AM    I was present for the entirety of the procedure(s). Hyeon Yu, MD

## 2023-12-19 ENCOUNTER — Other Ambulatory Visit (HOSPITAL_COMMUNITY)

## 2023-12-22 ENCOUNTER — Ambulatory Visit (INDEPENDENT_AMBULATORY_CARE_PROVIDER_SITE_OTHER)

## 2023-12-22 DIAGNOSIS — I96 Gangrene, not elsewhere classified: Secondary | ICD-10-CM

## 2023-12-23 LAB — VAS US ABI WITH/WO TBI

## 2023-12-24 ENCOUNTER — Telehealth (INDEPENDENT_AMBULATORY_CARE_PROVIDER_SITE_OTHER): Payer: Self-pay

## 2023-12-24 NOTE — Telephone Encounter (Signed)
 Spoke with the patient and his spouse, the patient is scheduled for surgery on 12/26/23 with a 11:00 am arrival time to the [facility] for a LLE angio with Dr. Marea. Pre-procedure instructions were discussed and per the patient the spouse was writing them down. Patient does not have Mychart and all questions answered.

## 2023-12-26 ENCOUNTER — Other Ambulatory Visit

## 2023-12-26 ENCOUNTER — Encounter
Admission: RE | Disposition: A | Discharge status: Home / Self Care | Payer: Self-pay | Source: Home / Self Care | Attending: Vascular Surgery

## 2023-12-26 ENCOUNTER — Other Ambulatory Visit: Payer: Self-pay

## 2023-12-26 ENCOUNTER — Encounter: Payer: Self-pay | Admitting: Vascular Surgery

## 2023-12-26 ENCOUNTER — Ambulatory Visit
Admission: RE | Admit: 2023-12-26 | Discharge: 2023-12-26 | Disposition: A | Discharge status: Home / Self Care | Attending: Vascular Surgery | Admitting: Vascular Surgery

## 2023-12-26 DIAGNOSIS — D631 Anemia in chronic kidney disease: Secondary | ICD-10-CM | POA: Insufficient documentation

## 2023-12-26 DIAGNOSIS — I70245 Atherosclerosis of native arteries of left leg with ulceration of other part of foot: Secondary | ICD-10-CM | POA: Insufficient documentation

## 2023-12-26 DIAGNOSIS — E11621 Type 2 diabetes mellitus with foot ulcer: Secondary | ICD-10-CM | POA: Insufficient documentation

## 2023-12-26 DIAGNOSIS — I132 Hypertensive heart and chronic kidney disease with heart failure and with stage 5 chronic kidney disease, or end stage renal disease: Secondary | ICD-10-CM | POA: Diagnosis not present

## 2023-12-26 DIAGNOSIS — L97529 Non-pressure chronic ulcer of other part of left foot with unspecified severity: Secondary | ICD-10-CM | POA: Diagnosis not present

## 2023-12-26 DIAGNOSIS — E1122 Type 2 diabetes mellitus with diabetic chronic kidney disease: Secondary | ICD-10-CM | POA: Diagnosis not present

## 2023-12-26 DIAGNOSIS — I5042 Chronic combined systolic (congestive) and diastolic (congestive) heart failure: Secondary | ICD-10-CM | POA: Insufficient documentation

## 2023-12-26 DIAGNOSIS — I272 Pulmonary hypertension, unspecified: Secondary | ICD-10-CM | POA: Diagnosis not present

## 2023-12-26 DIAGNOSIS — Z79899 Other long term (current) drug therapy: Secondary | ICD-10-CM | POA: Diagnosis not present

## 2023-12-26 DIAGNOSIS — N186 End stage renal disease: Secondary | ICD-10-CM | POA: Diagnosis not present

## 2023-12-26 DIAGNOSIS — L97909 Non-pressure chronic ulcer of unspecified part of unspecified lower leg with unspecified severity: Secondary | ICD-10-CM

## 2023-12-26 DIAGNOSIS — Z87891 Personal history of nicotine dependence: Secondary | ICD-10-CM | POA: Insufficient documentation

## 2023-12-26 DIAGNOSIS — E1151 Type 2 diabetes mellitus with diabetic peripheral angiopathy without gangrene: Secondary | ICD-10-CM | POA: Diagnosis present

## 2023-12-26 DIAGNOSIS — Z7984 Long term (current) use of oral hypoglycemic drugs: Secondary | ICD-10-CM | POA: Insufficient documentation

## 2023-12-26 HISTORY — PX: LOWER EXTREMITY ANGIOGRAPHY: CATH118251

## 2023-12-26 LAB — GLUCOSE, CAPILLARY: Glucose-Capillary: 126 mg/dL — ABNORMAL HIGH (ref 70–99)

## 2023-12-26 LAB — POTASSIUM (ARMC VASCULAR LAB ONLY): Potassium (ARMC vascular lab): 4.6 mmol/L (ref 3.5–5.1)

## 2023-12-26 SURGERY — LOWER EXTREMITY ANGIOGRAPHY
Anesthesia: Moderate Sedation | Site: Leg Lower | Laterality: Left

## 2023-12-26 MED ORDER — HEPARIN SODIUM (PORCINE) 1000 UNIT/ML IJ SOLN
INTRAMUSCULAR | Status: AC
  Filled 2023-12-26: qty 10

## 2023-12-26 MED ORDER — FENTANYL CITRATE (PF) 100 MCG/2ML IJ SOLN
INTRAMUSCULAR | Status: DC | PRN
  Administered 2023-12-26: 50 ug via INTRAVENOUS

## 2023-12-26 MED ORDER — METHYLPREDNISOLONE SODIUM SUCC 125 MG IJ SOLR: 125.0000 mg | Freq: Once | INTRAMUSCULAR | Status: DC | PRN

## 2023-12-26 MED ORDER — HYDRALAZINE HCL 20 MG/ML IJ SOLN: 5.0000 mg | INTRAMUSCULAR | Status: DC | PRN

## 2023-12-26 MED ORDER — DIPHENHYDRAMINE HCL 50 MG/ML IJ SOLN: 50.0000 mg | Freq: Once | INTRAMUSCULAR | Status: DC | PRN

## 2023-12-26 MED ORDER — CEFAZOLIN SODIUM-DEXTROSE 1-4 GM/50ML-% IV SOLN
1.0000 g | INTRAVENOUS | Status: AC
  Administered 2023-12-26: 1 g via INTRAVENOUS

## 2023-12-26 MED ORDER — LIDOCAINE-EPINEPHRINE (PF) 1 %-1:200000 IJ SOLN
INTRAMUSCULAR | Status: DC | PRN
  Administered 2023-12-26: 10 mL

## 2023-12-26 MED ORDER — SODIUM CHLORIDE 0.9% FLUSH: 3.0000 mL | Freq: Two times a day (BID) | INTRAVENOUS | Status: DC

## 2023-12-26 MED ORDER — SODIUM CHLORIDE 0.9 % IV SOLN: INTRAVENOUS | Status: DC

## 2023-12-26 MED ORDER — CLOPIDOGREL BISULFATE 75 MG PO TABS: 75.0000 mg | ORAL_TABLET | Freq: Every day | ORAL | 11 refills | Status: DC

## 2023-12-26 MED ORDER — CLOPIDOGREL BISULFATE 75 MG PO TABS: 75.0000 mg | ORAL_TABLET | Freq: Every day | ORAL | Status: DC

## 2023-12-26 MED ORDER — LABETALOL HCL 5 MG/ML IV SOLN: 10.0000 mg | INTRAVENOUS | Status: DC | PRN

## 2023-12-26 MED ORDER — MIDAZOLAM HCL 2 MG/2ML IJ SOLN
INTRAMUSCULAR | Status: DC | PRN
Start: 2023-12-26 — End: 2023-12-26
  Administered 2023-12-26: 1 mg via INTRAVENOUS

## 2023-12-26 MED ORDER — HEPARIN (PORCINE) IN NACL 1000-0.9 UT/500ML-% IV SOLN
INTRAVENOUS | Status: DC | PRN
  Administered 2023-12-26: 1000 mL

## 2023-12-26 MED ORDER — IODIXANOL 320 MG/ML IV SOLN
INTRAVENOUS | Status: DC | PRN
  Administered 2023-12-26: 45 mL

## 2023-12-26 MED ORDER — HYDROMORPHONE HCL 1 MG/ML IJ SOLN: 1.0000 mg | Freq: Once | INTRAMUSCULAR | Status: DC | PRN

## 2023-12-26 MED ORDER — MIDAZOLAM HCL 2 MG/2ML IJ SOLN
INTRAMUSCULAR | Status: AC
  Filled 2023-12-26: qty 4

## 2023-12-26 MED ORDER — SODIUM CHLORIDE 0.9 % IV SOLN: 250.0000 mL | INTRAVENOUS | Status: DC | PRN

## 2023-12-26 MED ORDER — CEFAZOLIN SODIUM-DEXTROSE 1-4 GM/50ML-% IV SOLN
INTRAVENOUS | Status: AC
  Filled 2023-12-26: qty 50

## 2023-12-26 MED ORDER — FAMOTIDINE 20 MG PO TABS: 40.0000 mg | ORAL_TABLET | Freq: Once | ORAL | Status: DC | PRN

## 2023-12-26 MED ORDER — HEPARIN SODIUM (PORCINE) 1000 UNIT/ML IJ SOLN
INTRAMUSCULAR | Status: DC | PRN
  Administered 2023-12-26: 4000 [IU] via INTRAVENOUS

## 2023-12-26 MED ORDER — FENTANYL CITRATE (PF) 100 MCG/2ML IJ SOLN
INTRAMUSCULAR | Status: AC
  Filled 2023-12-26: qty 2

## 2023-12-26 MED ORDER — ACETAMINOPHEN 325 MG PO TABS: 650.0000 mg | ORAL_TABLET | ORAL | Status: DC | PRN

## 2023-12-26 MED ORDER — MIDAZOLAM HCL 2 MG/ML PO SYRP: 8.0000 mg | ORAL_SOLUTION | Freq: Once | ORAL | Status: DC | PRN

## 2023-12-26 MED ORDER — SODIUM CHLORIDE 0.9% FLUSH: 3.0000 mL | INTRAVENOUS | Status: DC | PRN

## 2023-12-26 MED ORDER — ONDANSETRON HCL 4 MG/2ML IJ SOLN: 4.0000 mg | Freq: Four times a day (QID) | INTRAMUSCULAR | Status: DC | PRN

## 2023-12-26 SURGICAL SUPPLY — 16 items
BALLOON JADE .014 4.0 X 40 0 (BALLOONS) IMPLANT
BALLOON ULTRVRSE 3.5X40X150 (BALLOONS) IMPLANT
CATH CXI SUPP ANG 4FR 135 (CATHETERS) IMPLANT
CATH PIG 70CM (CATHETERS) IMPLANT
COVER PROBE ULTRASOUND 5X96 (MISCELLANEOUS) IMPLANT
DEVICE PRESTO INFLATION (MISCELLANEOUS) IMPLANT
DEVICE STARCLOSE SE CLOSURE (Vascular Products) IMPLANT
GLIDEWIRE ADV .035X260CM (WIRE) IMPLANT
PACK ANGIOGRAPHY (CUSTOM PROCEDURE TRAY) ×1 IMPLANT
SHEATH BRITE TIP 5FRX11 (SHEATH) IMPLANT
SHEATH RAABE 6FRX70 (SHEATH) IMPLANT
STENT ESPRIT BTK 3.7X38 SCAFF (Permanent Stent) IMPLANT
SYR MEDRAD MARK 7 150ML (SYRINGE) IMPLANT
TUBING CONTRAST HIGH PRESS 72 (TUBING) IMPLANT
WIRE COMMAND ST ANG 014 300 (WIRE) IMPLANT
WIRE J 3MM .035X145CM (WIRE) IMPLANT

## 2023-12-26 NOTE — Op Note (Signed)
 [name]  Percutaneous Study/Intervention Procedural Note   Date of Surgery: 12/26/2023  Surgeon(s):Aletta Edmunds    Assistants:none  Pre-operative Diagnosis: PAD with ulceration LLE  Post-operative diagnosis:  Same  Procedure(s) Performed:             1.  Ultrasound guidance for vascular access right femoral artery             2.  Catheter placement into left common femoral artery from right femoral approach             3.  Aortogram and selective left lower extremity angiogram             4.  Percutaneous transluminal angioplasty of left TP trunk with 3.5 mm balloon             5.  Stent placement to the left tibioperoneal trunk with 3.7 mm diameter by 38 mm length Esprit scaffolding system  6.  StarClose closure device right femoral artery  EBL: 5 cc  Contrast: 45 cc  Fluoro Time: 4.9 minutes  Moderate Conscious Sedation Time: approximately 34 minutes using 1 mg of Versed  and 50 mcg of Fentanyl               Indications:  Patient is a 68 y.o.male with a litany of medical issues and nonhealing ulcerations of the left foot that need digital resection. The patient has noninvasive study showing noncompressible vessels with diminished waveforms distally. The patient is brought in for angiography for further evaluation and potential treatment.  Due to the limb threatening nature of the situation, angiogram was performed for attempted limb salvage. The patient is aware that if the procedure fails, amputation would be expected.  The patient also understands that even with successful revascularization, amputation may still be required due to the severity of the situation.  Risks and benefits are discussed and informed consent is obtained.   Procedure:  The patient was identified and appropriate procedural time out was performed.  The patient was then placed supine on the table and prepped and draped in the usual sterile fashion. Moderate conscious sedation was  administered during a face to face encounter with the patient throughout the procedure with my supervision of the RN administering medicines and monitoring the patient's vital signs, pulse oximetry, telemetry and mental status throughout from the start of the procedure until the patient was taken to the recovery room. Ultrasound was used to evaluate the right common femoral artery.  It was patent .  A digital ultrasound image was acquired.  A Seldinger needle was used to access the right common femoral artery under direct ultrasound guidance and a permanent image was performed.  A 0.035 J wire was advanced without resistance and a 5Fr sheath was placed.  Pigtail catheter was placed into the aorta and an AP aortogram was performed. This demonstrated normal renal arteries and normal aorta and iliac segments without significant stenosis. I then crossed the aortic bifurcation and advanced to the left femoral head. Selective left lower extremity angiogram was then performed. This demonstrated fairly normal common femoral artery, superficial femoral artery, and mild disease of the popliteal artery which was not flow-limiting.  The tibioperoneal trunk had greater than 85% stenosis.  The peroneal artery was then a dominant runoff distally with occlusion of the posterior tibial and anterior tibial arteries. It was felt that it was in the patient's best interest to proceed with intervention after these images to avoid a second procedure and a larger  amount of contrast and fluoroscopy based off of the findings from the initial angiogram. The patient was systemically heparinized and a 6 French 70 cm sheath was then placed over the Terumo Advantage wire. I then used a Kumpe catheter and the advantage wire to get down in the popliteal artery then exchanged for a CXI catheter and a 0.014 command wire.  I then crossed the stenosis in the tibioperoneal trunk and parked the wire in the peroneal artery.  Angioplasty was initially  performed with a 3.5 mm diameter by 4 cm length angioplasty balloon inflated to 10 atm for 1 minute.  There was still greater than 50% residual stenosis we elected to stent the artery.  A 3.75 mm diameter by 38 mm length Esprit scaffolding system was placed and then postdilated with a 4 mm balloon with excellent angiographic completion result and only about a 10% residual stenosis. I elected to terminate the procedure. The sheath was removed and StarClose closure device was deployed in the right femoral artery with excellent hemostatic result. The patient was taken to the recovery room in stable condition having tolerated the procedure well.  Findings:               Aortogram:  This demonstrated normal renal arteries and normal aorta and iliac segments without significant stenosis.             Left Lower Extremity:  This demonstrated fairly normal common femoral artery, superficial femoral artery, and mild disease of the popliteal artery which was not flow-limiting.  The tibioperoneal trunk had greater than 85% stenosis.  The peroneal artery was then a dominant runoff distally with occlusion of the posterior tibial and anterior tibial arteries.   Disposition: Patient was taken to the recovery room in stable condition having tolerated the procedure well.  Complications: None  Selinda Gu 12/26/2023 12:59 PM   This note was created with Dragon Medical transcription system. Any errors in dictation are purely unintentional.

## 2023-12-26 NOTE — H&P (Signed)
 [facility] Admission History & Physical  MRN : [id number]  [name] is a 68 y.o. (1956/03/08) male who presents with chief complaint of No chief complaint on file. SABRA  History of Present Illness: We are asked to see the patient by Dr. Ashley for nonhealing ulcerations of the left foot.  Patient has a litany of severe medical issues as listed below and is a very high risk patient.  His wound has been poorly healing.  He is at high risk of limb loss.  He had noninvasive study showing noncompressible vessels with reduced digital pressures bilaterally.  He has significant pain in the left foot.  Current Facility-Administered Medications  Medication Dose Route Frequency Provider Last Rate Last Admin   0.9 %  sodium chloride  infusion   Intravenous Continuous Brown, Fallon E, NP       ceFAZolin  (ANCEF ) IVPB 1 g/50 mL premix  1 g Intravenous 30 min Pre-Op  Brown, Fallon E, NP       diphenhydrAMINE  (BENADRYL ) injection 50 mg  50 mg Intravenous Once PRN Brown, Fallon E, NP       famotidine  (PEPCID ) tablet 40 mg  40 mg Oral Once PRN Brown, Fallon E, NP       HYDROmorphone  (DILAUDID ) injection 1 mg  1 mg Intravenous Once PRN Brown, Fallon E, NP       methylPREDNISolone  sodium succinate (SOLU-MEDROL ) 125 mg/2 mL injection 125 mg  125 mg Intravenous Once PRN Brown, Fallon E, NP       midazolam  (VERSED ) 2 MG/ML syrup 8 mg  8 mg Oral Once PRN Brown, Fallon E, NP       ondansetron  (ZOFRAN ) injection 4 mg  4 mg Intravenous Q6H PRN Delores Orvin BRAVO, NP        Past Medical History:  Diagnosis Date   Abdominopelvic abscess (HCC) 09/29/2018   Abscess of appendix 09/22/2018   Acquired spondylolisthesis of lumbosacral region 03/20/2020   Acute cholecystitis 11/15/2023   Acute kidney injury (HCC) 10/07/2018   Acute kidney injury superimposed on chronic kidney disease (HCC) 11/06/2020   Acute kidney injury superimposed on CKD (HCC) 02/18/2020   Acute on chronic systolic  (congestive) heart failure (HCC) 07/26/2015   Formatting of this note might be different from the original. Formatting of this note might be different from the original. Formatting of this note might be different from the original. revenkar EF 35% Formatting of this note might be different from the original. revenkar EF 35%     Acute on chronic systolic CHF (congestive heart failure) (HCC) 08/12/2023   Anasarca 07/01/2023   Anemia of chronic disease 08/18/2019   Anemia, chronic disease 08/18/2019   Aortic atherosclerosis (HCC)    Aortic stenosis    a.) TTE 09/15/2018: mild-mod (MPG 19); b.) TTE 03/21/2021: mild-mod (MPG 14.3); c.) TTE 04/24/2022: mild- mod (MPG 15)   Aortic stenosis, moderate 10/15/2019   Appendicitis 09/08/2018   ARF (acute renal failure) (HCC) 02/19/2020   Asthma    Atherosclerosis of native arteries of the extremities with ulceration (HCC) 02/24/2018   Formatting of this note might be different from the original.  Last Assessment & Plan:   His ABIs today are 1.07 on the right and 0.99 on the left with biphasic waveforms.  Although these pressures may be somewhat elevated from medial calcification, his flow does appear to be reasonably good.  His left ABI was 0.58 prior to intervention.  At this point, we will stretch out his follow-up and  see hi   Benign hypertension with CKD (chronic kidney disease) stage IV (HCC) 02/24/2018   Benign prostatic hyperplasia without lower urinary tract symptoms 01/14/2018   Bilateral lower extremity edema 11/04/2018   Bradycardia 03/25/2018   Bruit of right carotid artery 07/26/2015   CAD (coronary artery disease) 2003   a.) s/p 4v CABG 2003   Cardiac murmur 07/26/2015   Choledocholithiasis 11/12/2023   Chronic combined systolic and diastolic CHF (congestive heart failure) (HCC) 07/26/2015   a.) TTE 09/15/2018: EF 50-55%, mod LVH, RVE, BAE, mild-mod TR, AoV sclerosis, G1DD; b.) MPI 10/27/2019: EF <30%; c.) TTE 03/21/2021: EF 35-40%, post  AK, inf HK, mod LVH, mod red RVSF, mod LAE, mod Aov sclerosis, G2DD; c.) TTE 04/24/2022: EF 35-40%, post AK, glok HK, mod LVE, mod red RVSF, mild-mod MR, Aov sclerosis, G3DD   Chronic idiopathic constipation 11/02/2018   Chronic low back pain without sciatica 12/02/2018   Chronic pain of right hip 03/31/2020   Chronic systolic heart failure (HCC) 07/26/2015   Formatting of this note might be different from the original. revenkar EF 35%     CKD (chronic kidney disease) stage 4, GFR 15-29 ml/min (HCC) 10/07/2018   Congestive heart failure (HCC) 07/26/2015   Last Assessment & Plan:   Poor cardiac function could certainly be contributing to poor blood supply to the feet and toes as well.     Formatting of this note might be different from the original.  Formatting of this note might be different from the original.  Last Assessment & Plan:   Poor cardiac function could certainly be contributing to poor blood supply to the feet and toes as well.     Last   Coronary artery disease    Diabetes mellitus without complication (HCC)    Dysphagia 07/26/2015   Encounter for long-term (current) use of insulin  (HCC) 09/27/2020   Erectile dysfunction 07/14/2017   ESRD on dialysis [facility]) 11/15/2023   Essential hypertension 02/24/2018   Fever 11/07/2020   Foot ulcer (HCC) 09/27/2020   left foot  Formatting of this note might be different from the original.  Formatting of this note might be different from the original.  left foot     Foot ulcer, left (HCC)    GERD (gastroesophageal reflux disease)    Heart murmur 07/26/2015   Hematuria 07/27/2015   High risk medication use 07/26/2015   History of marijuana use    Hyperkalemia 05/29/2020   Hyperlipidemia 07/26/2015   Hyperlipidemia associated with type 2 diabetes mellitus (HCC) 02/18/2020   Hypertension    Hypertension associated with diabetes (HCC)    Hypoglycemia 05/07/2019   Hypothyroidism 11/14/2015   Hypothyroidism (acquired) 11/14/2015   Insomnia  11/02/2018   Kidney insufficiency    Lumbar spondylosis 03/20/2020   Malaise and fatigue 03/25/2018   Malnutrition of moderate degree 08/20/2023   Mitral regurgitation 05/12/2019   Moderate aortic regurgitation 05/12/2019   Myocardial infarction due to demand ischemia (HCC) 09/26/2020   a.) Type II NSTEMI; b.) troponins were trended 0.54 --> 0.56 --> 0.52 ng/mL   Non-compliance 05/05/2019   NSTEMI (non-ST elevated myocardial infarction) (HCC) 09/27/2020   Osseous and subluxation stenosis of intervertebral foramina of lumbar region 03/20/2020   Osteoarthritis 10/06/2019   Pancytopenia (HCC) 08/12/2023   Peripheral vascular disease (HCC)    Personal history of tobacco use, presenting hazards to health 09/27/2020   Polyneuropathy in diabetes (HCC) 05/07/2019   Primary osteoarthritis involving multiple joints 10/06/2019   Primary osteoarthritis of right hip  02/21/2020   Pulmonary HTN (HCC)    a.) TTE 05/12/2019: PASP 71; b.) TTE 09/27/2020: PASP >70; c.) TTE 03/21/2021: RVSP 43; d.) TTE 04/24/2022: RVSP 80.6   Puncture wound of right hip 04/02/2018   PVD (peripheral vascular disease) with claudication (HCC)    a.) s/p PTA 03/02/2018 - balloon angioplasty LEFT below knee popliteal artery; b.) s/p PTA 09/30/2022: baloon angioplasty LEFT tibioperoneal trunck, most proximal peroneal artery, and LEFT popliteal artery.   Regional wall motion abnormality of heart 05/07/2019   Right groin pain 02/18/2020   S/P CABG x 4 2003   Screening for colon cancer 02/18/2018   Sepsis (HCC) 11/15/2023   Sleep apnea 07/26/2015   a.) does not require nocturnal PAP therapy   Subacute osteomyelitis of left foot (HCC) 03/25/2018   Thrombocytopenia (HCC)    Type 2 diabetes mellitus with hyperlipidemia (HCC) 07/26/2015   Type 2 diabetes mellitus with stage 4 chronic kidney disease, with long-term current use of insulin  (HCC) 07/26/2015   Type II diabetes mellitus with neurological manifestations (HCC) 02/18/2020    Vitamin B12 deficiency 06/16/2019   Vitamin D deficiency 05/07/2018    Past Surgical History:  Procedure Laterality Date   AMPUTATION TOE Left 03/13/2018   Procedure: AMPUTATION TOE-MPJ;  Surgeon: Ashley Soulier, DPM;  Location: [facility] ORS;  Service: Podiatry;  Laterality: Left;   ANGIOPLASTY     AV FISTULA PLACEMENT Left 08/20/2023   Procedure: ARTERIOVENOUS (AV) FISTULA CREATION;  Surgeon: Gretta Lonni PARAS, MD;  Location: [facility] OR;  Service: Vascular;  Laterality: Left;   CARDIAC CATHETERIZATION     CORONARY ARTERY BYPASS GRAFT N/A 2003   IR FLUORO GUIDE CV LINE RIGHT  08/18/2023   IR PERC CHOLECYSTOSTOMY  11/12/2023   IR US  GUIDE VASC ACCESS RIGHT  08/18/2023   LAPAROSCOPIC APPENDECTOMY N/A 09/08/2018   Procedure: APPENDECTOMY LAPAROSCOPIC;  Surgeon: Jordis Laneta FALCON, MD;  Location: [facility] ORS;  Service: General;  Laterality: N/A;   LOWER EXTREMITY ANGIOGRAPHY Left 03/02/2018   Procedure: LOWER EXTREMITY ANGIOGRAPHY;  Surgeon: Marea Selinda RAMAN, MD;  Location: [facility];  Service: Cardiovascular;  Laterality: Left;   LOWER EXTREMITY ANGIOGRAPHY Left 09/30/2022   Procedure: Lower Extremity Angiography;  Surgeon: Marea Selinda RAMAN, MD;  Location: [facility];  Service: Cardiovascular;  Laterality: Left;   RIGHT/LEFT HEART CATH AND CORONARY/GRAFT ANGIOGRAPHY N/A 08/01/2023   Procedure: RIGHT/LEFT HEART CATH AND CORONARY/GRAFT ANGIOGRAPHY;  Surgeon: Wendel Lurena POUR, MD;  Location: [facility];  Service: Cardiovascular;  Laterality: N/A;   ROTATOR CUFF REPAIR Left      Social History   Tobacco Use   Smoking status: Former    Current packs/day: 0.00    Average packs/day: 0.5 packs/day for 25.0 years (12.5 ttl pk-yrs)    Types: Cigarettes    Start date: 30    Quit date: 2003    Years since quitting: 22.5   Smokeless tobacco: Never  Vaping Use   Vaping status: Never Used  Substance Use Topics   Alcohol use: Not Currently    Comment: 2003   Drug use: Never      Family History  Problem Relation Age of Onset   Hyperlipidemia Mother    Hypertension Mother    Diabetes Mother    Heart attack Brother    Heart disease Brother    Lung disease Father     No Known Allergies   REVIEW OF SYSTEMS (Negative unless checked)  Constitutional: [] Weight loss  [] Fever  [] Chills Cardiac: []   Chest pain   [] Chest pressure   [] Palpitations   [] Shortness of breath when laying flat   [] Shortness of breath at rest   [] Shortness of breath with exertion. Vascular:  [x] Pain in legs with walking   [] Pain in legs at rest   [] Pain in legs when laying flat   [] Claudication   [] Pain in feet when walking  [x] Pain in feet at rest  [] Pain in feet when laying flat   [] History of DVT   [] Phlebitis   [] Swelling in legs   [] Varicose veins   [x] Non-healing ulcers Pulmonary:   [x] Uses home oxygen   [] Productive cough   [] Hemoptysis   [] Wheeze  [x] COPD   [] Asthma Neurologic:  [] Dizziness  [] Blackouts   [] Seizures   [] History of stroke   [] History of TIA  [] Aphasia   [] Temporary blindness   [] Dysphagia   [] Weakness or numbness in arms   [] Weakness or numbness in legs Musculoskeletal:  [x] Arthritis   [] Joint swelling   [x] Joint pain   [] Low back pain Hematologic:  [] Easy bruising  [] Easy bleeding   [] Hypercoagulable state   [x] Anemic  [] Hepatitis Gastrointestinal:  [] Blood in stool   [] Vomiting blood  [x] Gastroesophageal reflux/heartburn   [] Difficulty swallowing. Genitourinary:  [x] Chronic kidney disease   [] Difficult urination  [] Frequent urination  [] Burning with urination   [] Blood in urine Skin:  [] Rashes   [x] Ulcers   [x] Wounds Psychological:  [] History of anxiety   []  History of major depression.  Physical Examination  Vitals:   12/26/23 1119  BP: (!) 117/99  Pulse: 77  Resp: 14  Temp: 97.8 F (36.6 C)  TempSrc: Oral  SpO2: 97%  Weight: 82.2 kg  Height: 6' (1.829 m)   Body mass index is 24.59 kg/m. Gen: WD/WN, NAD.  Appears older than stated age Head: [place]/AT, No  temporalis wasting.  Ear/Nose/Throat: Hearing grossly intact, nares w/o erythema or drainage, oropharynx w/o Erythema/Exudate,  Eyes: Conjunctiva clear, sclera non-icteric Neck: Trachea midline.  No JVD.  Pulmonary:  Good air movement, respirations not labored, no use of accessory muscles.  Cardiac: irregular Vascular:  Vessel Right Left  Radial Palpable Palpable                          PT 1+ Palpable 1+ Palpable  DP 1+ Palpable Not Palpable   Gastrointestinal: soft, non-tender/non-distended. No guarding/reflex.  Musculoskeletal: M/S 5/5 throughout.  Extremities without ischemic changes.  No deformity or atrophy.  Neurologic: Sensation grossly intact in extremities.  Symmetrical.  Speech is fluent. Motor exam as listed above. Psychiatric: Judgment intact, Mood & affect appropriate for pt's clinical situation. Dermatologic: toe ulceration currently dressed     CBC Lab Results  Component Value Date   WBC 6.8 11/14/2023   HGB 11.4 (L) 11/14/2023   HCT 35.1 (L) 11/14/2023   MCV 93.4 11/14/2023   PLT 95 (L) 11/14/2023    BMET    Component Value Date/Time   NA 135 11/14/2023 0552   NA 145 (H) 07/14/2023 1412   NA 136 08/30/2013 1025   K 4.1 11/14/2023 0552   K 3.7 08/30/2013 1025   CL 95 (L) 11/14/2023 0552   CL 101 08/30/2013 1025   CO2 25 11/14/2023 0552   CO2 31 08/30/2013 1025   GLUCOSE 112 (H) 11/14/2023 0552   GLUCOSE 310 (H) 08/30/2013 1025   BUN 35 (H) 11/14/2023 0552   BUN 97 (HH) 07/14/2023 1412   BUN 17 08/30/2013 1025   CREATININE 5.48 (H) 11/14/2023  9447   CREATININE 1.15 08/30/2013 1025   CALCIUM  8.4 (L) 11/14/2023 0552   CALCIUM  9.4 08/30/2013 1025   GFRNONAA 11 (L) 11/14/2023 0552   GFRNONAA >60 08/30/2013 1025   GFRAA 27 (L) 02/22/2020 0539   GFRAA >60 08/30/2013 1025   CrCl cannot be calculated (Patient's most recent lab result is older than the maximum 21 days allowed.).  COAG Lab Results  Component Value Date   INR 1.3 (H) 11/12/2023    INR 1.4 (H) 11/11/2023   INR 1.4 (H) 08/17/2023    Radiology VAS US  ABI WITH/WO TBI Result Date: 12/23/2023  LOWER EXTREMITY DOPPLER STUDY Patient Name:  [facility]ip [name]  Date of Exam:   12/22/2023 Medical Rec #: [id number]         Accession #:    [id number] Date of Birth: Dec 17, 1955        Patient Gender: M Patient Age:   57 years Exam Location:  [facility] Procedure:      VAS US  ABI WITH/WO TBI Referring Phys: --------------------------------------------------------------------------------  Indications: Peripheral artery disease.  Vascular Interventions: 09/30/2022 Percutaneous transluminal angioplasty of left                         tibioperoneal trunk and most proximal peroneal artery                         with 4 mm diameter by 6 cm length Lutonix drug-coated                         angioplasty balloon                         5. Percutaneous transluminal angioplasty of left                         popliteal artery with 6 mm diameter by 8 cm length                         Lutonix drug-coated angioplasty balloon. Comparison Study: 09/2022 Performing Technologist: Jerel Croak RVT  Examination Guidelines: A complete evaluation includes at minimum, Doppler waveform signals and systolic blood pressure reading at the level of bilateral brachial, anterior tibial, and posterior tibial arteries, when vessel segments are accessible. Bilateral testing is considered an integral part of a complete examination. Photoelectric Plethysmograph (PPG) waveforms and toe systolic pressure readings are included as required and additional duplex testing as needed. Limited examinations for reoccurring indications may be performed as noted.  ABI Findings: +---------+------------------+-----+----------+--------+ Right    Rt Pressure (mmHg)IndexWaveform  Comment  +---------+------------------+-----+----------+--------+ Brachial 118                                        +---------+------------------+-----+----------+--------+ ATA                             monophasicNonComp  +---------+------------------+-----+----------+--------+ PTA                             monophasicNonComp  +---------+------------------+-----+----------+--------+ Great Toe50                0.42 Abnormal           +---------+------------------+-----+----------+--------+ +---------+------------------+-----+--------+-------+  Left     Lt Pressure (mmHg)IndexWaveformComment +---------+------------------+-----+--------+-------+ Brachial 120                                    +---------+------------------+-----+--------+-------+ ATA                             biphasicNonComp +---------+------------------+-----+--------+-------+ PTA                             biphasicNonComp +---------+------------------+-----+--------+-------+ Great Toe82                0.68 Abnormal        +---------+------------------+-----+--------+-------+ +-------+-----------+-------------+------------+------------+ ABI/TBIToday's ABIToday's TBI  Previous ABIPrevious TBI +-------+-----------+-------------+------------+------------+ Right  NonComp    .42          NonComp     .50          +-------+-----------+-------------+------------+------------+ Left   NonComp    .68 (2nd toe)NonComp     Amp          +-------+-----------+-------------+------------+------------+ Bilateral ABIs appear essentially unchanged compared to prior study on 09/2022.  Summary: Right: Resting right ankle-brachial index indicates noncompressible right lower extremity arteries. The right toe-brachial index is abnormal. Left: Resting left ankle-brachial index indicates noncompressible left lower extremity arteries. The left toe-brachial index is abnormal. Great toe amp; 2nd toe near normal TBI. *See table(s) above for measurements and observations.  Electronically signed by Cordella Shawl MD on 12/23/2023  at 7:24:13 AM.    Final      Assessment/Plan 1.  Atherosclerosis with ulceration left lower extremity.  Noninvasive studies showed noncompressible vessels with reduced digital waveforms.  Suspect tibial disease is present and angiography will be performed with mild sedation to further evaluate his perfusion for wound healing.  This is clearly a critical and limb threatening situation with a high risk of limb loss. 2.  End-stage renal disease.  Potassium will be checked.  Continue scheduled dialysis treatments. 3.  Pulmonary hypertension.  Avoid hypotension try to keep sedation relatively light. 4.  Diabetes. blood glucose control important in reducing the progression of atherosclerotic disease. Also, involved in wound healing. On appropriate medications.    Selinda Gu, MD  12/26/2023 11:23 AM

## 2023-12-26 NOTE — Discharge Instructions (Signed)

## 2023-12-29 ENCOUNTER — Encounter: Payer: Self-pay | Admitting: Vascular Surgery

## 2023-12-29 ENCOUNTER — Other Ambulatory Visit: Payer: Self-pay | Admitting: Radiology

## 2023-12-29 DIAGNOSIS — K8020 Calculus of gallbladder without cholecystitis without obstruction: Secondary | ICD-10-CM

## 2023-12-31 ENCOUNTER — Ambulatory Visit
Admission: RE | Admit: 2023-12-31 | Discharge: 2023-12-31 | Disposition: A | Discharge status: Home / Self Care | Source: Ambulatory Visit | Attending: Surgery | Admitting: Surgery

## 2023-12-31 ENCOUNTER — Ambulatory Visit
Admission: RE | Admit: 2023-12-31 | Discharge: 2023-12-31 | Disposition: A | Discharge status: Home / Self Care | Source: Ambulatory Visit | Attending: Radiology | Admitting: Radiology

## 2023-12-31 ENCOUNTER — Other Ambulatory Visit: Payer: Self-pay | Admitting: Surgery

## 2023-12-31 ENCOUNTER — Other Ambulatory Visit: Payer: Self-pay | Admitting: Interventional Radiology

## 2023-12-31 ENCOUNTER — Other Ambulatory Visit: Payer: Self-pay | Admitting: Radiology

## 2023-12-31 DIAGNOSIS — K8020 Calculus of gallbladder without cholecystitis without obstruction: Secondary | ICD-10-CM

## 2023-12-31 DIAGNOSIS — K8050 Calculus of bile duct without cholangitis or cholecystitis without obstruction: Secondary | ICD-10-CM

## 2023-12-31 HISTORY — PX: IR CATHETER TUBE CHANGE: IMG717

## 2023-12-31 HISTORY — PX: IR RADIOLOGIST EVAL & MGMT: IMG5224

## 2023-12-31 MED ORDER — LIDOCAINE HCL (PF) 1 % IJ SOLN
10.0000 mL | Freq: Once | INTRAMUSCULAR | Status: AC
  Administered 2023-12-31: 10 mL via INTRADERMAL

## 2023-12-31 MED ORDER — IOPAMIDOL (ISOVUE-300) INJECTION 61%
30.0000 mL | Freq: Once | INTRAVENOUS | Status: AC | PRN
  Administered 2023-12-31: 10 mL

## 2023-12-31 MED ORDER — SODIUM CHLORIDE 0.9% FLUSH: 10.0000 mL | INTRAVENOUS | Status: DC | PRN

## 2023-12-31 NOTE — Progress Notes (Signed)
 Chief Complaint: Patient was seen in consultation today for acute cholecystitis s/p percutaneous cholecystostomy drain placement,  Referring Physician(s): Dr. Dreama Hanger, MD   Supervising Physician: Johann Sieving  History of Present Illness: OBE AHLERS is a 68 y.o. male with past medical history of ESRD on HD, severe aortic stenosis planning on TAVR soon, HFrEF, NSTEMI 2022, CAD s/p CABG in 2003, GERD, DM2, HTN, thrombocytopenia, and prior lap appendectomy in 2020 who presented to [facility] ED with acute abdominal pain after he was found to be febrile at his dialysis center 6/10.   Initially his RUQ US  was nonspecific for acute cholecystitis despite RUQ pain, tachycardia, fever, and elevated lactic acid.  However, subsequent MR Abdomen/MRCP showed a distended gallbladder with innumerable stones, without biliary duct dilation. Due to notable cardiac and ESRD comorbidities, patient was recommended for percutaneous cholecystostomy drain placement, which was placed  in IR on 6/11. He improved subsequently, and eventually discharged with multiple follow-up appointments, including with outpatient IR clinic. His planned TAVR p[procedure was postponed in light of his acute cholecystitis.  Patient has been seen by cardiology and vascular surgery. His TAVR remains pending. He was informed by his cardiology team that his potential cholecystectomy will have to be deferred pending cardiac evaluation, TAVR procedure, and eventual clearance.  Patient presents to IR clinic today for 7 week follow-up. He endorses expected discomfort from his drain, but other is overall well. He is tired since his dialysis session yesterday. He has a necrotic toe, being evaluated, that causes him pain. His wife is at his side. His drain has good bilious output daily. His wife flushes it daily. He denies fevers, chills, abdominal pain, nausea, vomiting.   His surgical follow-up appointment is scheduled for 8/11 with Dr.  Dasie.  Past Medical History:  Diagnosis Date   Abdominopelvic abscess (HCC) 09/29/2018   Abscess of appendix 09/22/2018   Acquired spondylolisthesis of lumbosacral region 03/20/2020   Acute cholecystitis 11/15/2023   Acute kidney injury (HCC) 10/07/2018   Acute kidney injury superimposed on chronic kidney disease (HCC) 11/06/2020   Acute kidney injury superimposed on CKD (HCC) 02/18/2020   Acute on chronic systolic (congestive) heart failure (HCC) 07/26/2015   Formatting of this note might be different from the original. Formatting of this note might be different from the original. Formatting of this note might be different from the original. revenkar EF 35% Formatting of this note might be different from the original. revenkar EF 35%     Acute on chronic systolic CHF (congestive heart failure) (HCC) 08/12/2023   Anasarca 07/01/2023   Anemia of chronic disease 08/18/2019   Anemia, chronic disease 08/18/2019   Aortic atherosclerosis (HCC)    Aortic stenosis    a.) TTE 09/15/2018: mild-mod (MPG 19); b.) TTE 03/21/2021: mild-mod (MPG 14.3); c.) TTE 04/24/2022: mild- mod (MPG 15)   Aortic stenosis, moderate 10/15/2019   Appendicitis 09/08/2018   ARF (acute renal failure) (HCC) 02/19/2020   Asthma    Atherosclerosis of native arteries of the extremities with ulceration (HCC) 02/24/2018   Formatting of this note might be different from the original.  Last Assessment & Plan:   His ABIs today are 1.07 on the right and 0.99 on the left with biphasic waveforms.  Although these pressures may be somewhat elevated from medial calcification, his flow does appear to be reasonably good.  His left ABI was 0.58 prior to intervention.  At this point, we will stretch out his follow-up and see hi  Benign hypertension with CKD (chronic kidney disease) stage IV (HCC) 02/24/2018   Benign prostatic hyperplasia without lower urinary tract symptoms 01/14/2018   Bilateral lower extremity edema 11/04/2018    Bradycardia 03/25/2018   Bruit of right carotid artery 07/26/2015   CAD (coronary artery disease) 2003   a.) s/p 4v CABG 2003   Cardiac murmur 07/26/2015   Choledocholithiasis 11/12/2023   Chronic combined systolic and diastolic CHF (congestive heart failure) (HCC) 07/26/2015   a.) TTE 09/15/2018: EF 50-55%, mod LVH, RVE, BAE, mild-mod TR, AoV sclerosis, G1DD; b.) MPI 10/27/2019: EF <30%; c.) TTE 03/21/2021: EF 35-40%, post AK, inf HK, mod LVH, mod red RVSF, mod LAE, mod Aov sclerosis, G2DD; c.) TTE 04/24/2022: EF 35-40%, post AK, glok HK, mod LVE, mod red RVSF, mild-mod MR, Aov sclerosis, G3DD   Chronic idiopathic constipation 11/02/2018   Chronic low back pain without sciatica 12/02/2018   Chronic pain of right hip 03/31/2020   Chronic systolic heart failure (HCC) 07/26/2015   Formatting of this note might be different from the original. revenkar EF 35%     CKD (chronic kidney disease) stage 4, GFR 15-29 ml/min (HCC) 10/07/2018   Congestive heart failure (HCC) 07/26/2015   Last Assessment & Plan:   Poor cardiac function could certainly be contributing to poor blood supply to the feet and toes as well.     Formatting of this note might be different from the original.  Formatting of this note might be different from the original.  Last Assessment & Plan:   Poor cardiac function could certainly be contributing to poor blood supply to the feet and toes as well.     Last   Coronary artery disease    Diabetes mellitus without complication (HCC)    Dysphagia 07/26/2015   Encounter for long-term (current) use of insulin  (HCC) 09/27/2020   Erectile dysfunction 07/14/2017   ESRD on dialysis [facility]) 11/15/2023   Essential hypertension 02/24/2018   Fever 11/07/2020   Foot ulcer (HCC) 09/27/2020   left foot  Formatting of this note might be different from the original.  Formatting of this note might be different from the original.  left foot     Foot ulcer, left (HCC)    GERD (gastroesophageal reflux  disease)    Heart murmur 07/26/2015   Hematuria 07/27/2015   High risk medication use 07/26/2015   History of marijuana use    Hyperkalemia 05/29/2020   Hyperlipidemia 07/26/2015   Hyperlipidemia associated with type 2 diabetes mellitus (HCC) 02/18/2020   Hypertension    Hypertension associated with diabetes (HCC)    Hypoglycemia 05/07/2019   Hypothyroidism 11/14/2015   Hypothyroidism (acquired) 11/14/2015   Insomnia 11/02/2018   Kidney insufficiency    Lumbar spondylosis 03/20/2020   Malaise and fatigue 03/25/2018   Malnutrition of moderate degree 08/20/2023   Mitral regurgitation 05/12/2019   Moderate aortic regurgitation 05/12/2019   Myocardial infarction due to demand ischemia (HCC) 09/26/2020   a.) Type II NSTEMI; b.) troponins were trended 0.54 --> 0.56 --> 0.52 ng/mL   Non-compliance 05/05/2019   NSTEMI (non-ST elevated myocardial infarction) (HCC) 09/27/2020   Osseous and subluxation stenosis of intervertebral foramina of lumbar region 03/20/2020   Osteoarthritis 10/06/2019   Pancytopenia (HCC) 08/12/2023   Peripheral vascular disease (HCC)    Personal history of tobacco use, presenting hazards to health 09/27/2020   Polyneuropathy in diabetes (HCC) 05/07/2019   Primary osteoarthritis involving multiple joints 10/06/2019   Primary osteoarthritis of right hip 02/21/2020   Pulmonary  HTN (HCC)    a.) TTE 05/12/2019: PASP 71; b.) TTE 09/27/2020: PASP >70; c.) TTE 03/21/2021: RVSP 43; d.) TTE 04/24/2022: RVSP 80.6   Puncture wound of right hip 04/02/2018   PVD (peripheral vascular disease) with claudication (HCC)    a.) s/p PTA 03/02/2018 - balloon angioplasty LEFT below knee popliteal artery; b.) s/p PTA 09/30/2022: baloon angioplasty LEFT tibioperoneal trunck, most proximal peroneal artery, and LEFT popliteal artery.   Regional wall motion abnormality of heart 05/07/2019   Right groin pain 02/18/2020   S/P CABG x 4 2003   Screening for colon cancer 02/18/2018   Sepsis  (HCC) 11/15/2023   Sleep apnea 07/26/2015   a.) does not require nocturnal PAP therapy   Subacute osteomyelitis of left foot (HCC) 03/25/2018   Thrombocytopenia (HCC)    Type 2 diabetes mellitus with hyperlipidemia (HCC) 07/26/2015   Type 2 diabetes mellitus with stage 4 chronic kidney disease, with long-term current use of insulin  (HCC) 07/26/2015   Type II diabetes mellitus with neurological manifestations (HCC) 02/18/2020   Vitamin B12 deficiency 06/16/2019   Vitamin D deficiency 05/07/2018    Past Surgical History:  Procedure Laterality Date   AMPUTATION TOE Left 03/13/2018   Procedure: AMPUTATION TOE-MPJ;  Surgeon: Ashley Soulier, DPM;  Location: [facility] ORS;  Service: Podiatry;  Laterality: Left;   ANGIOPLASTY     AV FISTULA PLACEMENT Left 08/20/2023   Procedure: ARTERIOVENOUS (AV) FISTULA CREATION;  Surgeon: Gretta Lonni PARAS, MD;  Location: [facility] OR;  Service: Vascular;  Laterality: Left;   CARDIAC CATHETERIZATION     CORONARY ARTERY BYPASS GRAFT N/A 2003   IR FLUORO GUIDE CV LINE RIGHT  08/18/2023   IR PERC CHOLECYSTOSTOMY  11/12/2023   IR US  GUIDE VASC ACCESS RIGHT  08/18/2023   LAPAROSCOPIC APPENDECTOMY N/A 09/08/2018   Procedure: APPENDECTOMY LAPAROSCOPIC;  Surgeon: Jordis Laneta FALCON, MD;  Location: [facility] ORS;  Service: General;  Laterality: N/A;   LOWER EXTREMITY ANGIOGRAPHY Left 03/02/2018   Procedure: LOWER EXTREMITY ANGIOGRAPHY;  Surgeon: Marea Selinda RAMAN, MD;  Location: [facility] INVASIVE CV LAB;  Service: Cardiovascular;  Laterality: Left;   LOWER EXTREMITY ANGIOGRAPHY Left 09/30/2022   Procedure: Lower Extremity Angiography;  Surgeon: Marea Selinda RAMAN, MD;  Location: [facility] INVASIVE CV LAB;  Service: Cardiovascular;  Laterality: Left;   LOWER EXTREMITY ANGIOGRAPHY Left 12/26/2023   Procedure: Lower Extremity Angiography;  Surgeon: Marea Selinda RAMAN, MD;  Location: [facility] INVASIVE CV LAB;  Service: Cardiovascular;  Laterality: Left;   RIGHT/LEFT HEART CATH AND CORONARY/GRAFT ANGIOGRAPHY N/A  08/01/2023   Procedure: RIGHT/LEFT HEART CATH AND CORONARY/GRAFT ANGIOGRAPHY;  Surgeon: Wendel Lurena POUR, MD;  Location: [facility] INVASIVE CV LAB;  Service: Cardiovascular;  Laterality: N/A;   ROTATOR CUFF REPAIR Left     Allergies: Patient has no known allergies.  Medications: Prior to Admission medications   Medication Sig Start Date End Date Taking? Authorizing Provider  acetaminophen  (TYLENOL ) 500 MG tablet Take 1,000 mg by mouth every 6 (six) hours as needed for moderate pain or headache.     [provider]  aspirin  EC 81 MG tablet Take 1 tablet (81 mg total) by mouth daily. Swallow whole. 07/25/23   Thukkani, Arun K, MD  atorvastatin  (LIPITOR) 20 MG tablet Take 1 tablet (20 mg total) by mouth daily. 07/25/23   Thukkani, Arun K, MD  carvedilol  (COREG ) 3.125 MG tablet Take 1 tablet (3.125 mg total) by mouth 2 (two) times daily. 05/20/23 05/02/24  Carlin Delon BROCKS, NP  clopidogrel  (PLAVIX ) 75 MG tablet Take  1 tablet (75 mg total) by mouth daily. 12/26/23   Marea Selinda RAMAN, MD  gabapentin  (NEURONTIN ) 300 MG capsule Take 1 capsule (300 mg total) by mouth at bedtime. Patient taking differently: Take 300 mg by mouth 2 (two) times daily. 08/25/23   Arrien, Mauricio Daniel, MD  insulin  glargine (LANTUS ) 100 UNIT/ML injection Inject 7 Units into the skin daily as needed (Diabetes). Inject 7 units at bedtime as needed after checking glucose    [provider]  isosorbide  mononitrate (IMDUR ) 120 MG 24 hr tablet Take 1 tablet (120 mg total) by mouth daily. 08/27/23   Revankar, Jennifer SAUNDERS, MD  latanoprost  (XALATAN ) 0.005 % ophthalmic solution Place 1 drop into both eyes at bedtime. 04/15/23   [provider]  levothyroxine  (SYNTHROID ) 50 MCG tablet Take 50 mcg by mouth daily. 04/05/23   [provider]  nitroGLYCERIN  (NITROSTAT ) 0.4 MG SL tablet Place 1 tablet (0.4 mg total) under the tongue every 5 (five) minutes as needed for chest pain. 11/20/20   Revankar, Rajan R, MD  sodium  chloride flush (NS) 0.9 % SOLN Inject 5 mLs into the vein daily for 15 days. flush drain daily with 5 cc NS, record output daily, dressing changes every 2-3 days or earlier if soiled 11/14/23 12/15/23  Darci Pore, MD  tamsulosin  (FLOMAX ) 0.4 MG CAPS capsule Take 0.4 mg by mouth daily after breakfast. 04/19/20   [provider]  vitamin B-12 (CYANOCOBALAMIN ) 500 MCG tablet Take 500 mcg by mouth daily.     [provider]  Vitamin D, Ergocalciferol, (DRISDOL) 1.25 MG (50000 UNIT) CAPS capsule Take 50,000 Units by mouth once a week. Mondays 07/13/21   [provider]     Family History  Problem Relation Age of Onset   Hyperlipidemia Mother    Hypertension Mother    Diabetes Mother    Heart attack Brother    Heart disease Brother    Lung disease Father     Social History   Socioeconomic History   Marital status: Married    Spouse name: Not on file   Number of children: Not on file   Years of education: Not on file   Highest education level: Not on file  Occupational History   Not on file  Tobacco Use   Smoking status: Former    Current packs/day: 0.00    Average packs/day: 0.5 packs/day for 25.0 years (12.5 ttl pk-yrs)    Types: Cigarettes    Start date: 74    Quit date: 2003    Years since quitting: 22.5   Smokeless tobacco: Never  Vaping Use   Vaping status: Never Used  Substance and Sexual Activity   Alcohol use: Not Currently    Comment: 2003   Drug use: Never   Sexual activity: Not on file  Other Topics Concern   Not on file  Social History Narrative   Not on file   Social Drivers of Health   Financial Resource Strain: Low Risk  (09/26/2020)   Received from [facility]   Overall Financial Resource Strain (CARDIA)    Difficulty of Paying Living Expenses: Not hard at all  Food Insecurity: No Food Insecurity (11/12/2023)   Hunger Vital Sign    Worried About Running Out of Food in the Last Year: Never true    Ran Out of Food  in the Last Year: Never true  Transportation Needs: No Transportation Needs (11/12/2023)   PRAPARE - Transportation    Lack of Transportation (  Medical): No    Lack of Transportation (Non-Medical): No  Physical Activity: Inactive (09/26/2020)   [facility]   Exercise Vital Sign    On average, how many days per week do you engage in moderate to strenuous exercise (like a brisk walk)?: 0 days    On average, how many minutes do you engage in exercise at this level?: 0 min  Stress: No Stress Concern Present (09/26/2020)   Received from [facility] - Occupational Stress Questionnaire    Feeling of Stress : Not at all  Social Connections: Moderately Integrated (11/12/2023)   Social Connection and Isolation Panel    Frequency of Communication with Friends and Family: Three times a week    Frequency of Social Gatherings with Friends and Family: Once a week    Attends Religious Services: 1 to 4 times per year    Active Member of [employer] or Organizations: No    Attends Banker Meetings: Never    Marital Status: Married    Review of Systems: A 12 point ROS discussed and pertinent positives are indicated in the HPI above.  All other systems are negative.   Physical Exam Constitutional:      Appearance: Normal appearance.  Abdominal:     General: Abdomen is flat.     Palpations: Abdomen is soft.     Comments: RUQ drain appropriately dressed. Dressing is clean, dry, intact. Drain incision site non-tender, without evidence of infection.  Retaining suture and Stat Lock in place.  Good bilious output in collection bag.    Neurological:     Mental Status: He is alert.      Imaging: PERIPHERAL VASCULAR CATHETERIZATION Result Date: 12/26/2023 See surgical note for result.  VAS US  ABI WITH/WO TBI Result Date: 12/23/2023  LOWER EXTREMITY DOPPLER STUDY Patient Name:  Phillip [name]  Date of Exam:   12/22/2023 Medical  Rec #: [id number]         Accession #:    [id number] Date of Birth: 1956/04/21        Patient Gender: M Patient Age:   33 years Exam Location:  [facility] Procedure:      VAS US  ABI WITH/WO TBI Referring Phys: --------------------------------------------------------------------------------  Indications: Peripheral artery disease.  Vascular Interventions: 09/30/2022 Percutaneous transluminal angioplasty of left                         tibioperoneal trunk and most proximal peroneal artery                         with 4 mm diameter by 6 cm length Lutonix drug-coated                         angioplasty balloon                         5. Percutaneous transluminal angioplasty of left                         popliteal artery with 6 mm diameter by 8 cm length                         Lutonix drug-coated angioplasty balloon. Comparison Study: 09/2022 Performing Technologist: Jerel Croak RVT  Examination  Guidelines: A complete evaluation includes at minimum, Doppler waveform signals and systolic blood pressure reading at the level of bilateral brachial, anterior tibial, and posterior tibial arteries, when vessel segments are accessible. Bilateral testing is considered an integral part of a complete examination. Photoelectric Plethysmograph (PPG) waveforms and toe systolic pressure readings are included as required and additional duplex testing as needed. Limited examinations for reoccurring indications may be performed as noted.  ABI Findings: +---------+------------------+-----+----------+--------+ Right    Rt Pressure (mmHg)IndexWaveform  Comment  +---------+------------------+-----+----------+--------+ Brachial 118                                       +---------+------------------+-----+----------+--------+ ATA                             monophasicNonComp  +---------+------------------+-----+----------+--------+ PTA                             monophasicNonComp   +---------+------------------+-----+----------+--------+ Great Toe50                0.42 Abnormal           +---------+------------------+-----+----------+--------+ +---------+------------------+-----+--------+-------+ Left     Lt Pressure (mmHg)IndexWaveformComment +---------+------------------+-----+--------+-------+ Brachial 120                                    +---------+------------------+-----+--------+-------+ ATA                             biphasicNonComp +---------+------------------+-----+--------+-------+ PTA                             biphasicNonComp +---------+------------------+-----+--------+-------+ Great Toe82                0.68 Abnormal        +---------+------------------+-----+--------+-------+ +-------+-----------+-------------+------------+------------+ ABI/TBIToday's ABIToday's TBI  Previous ABIPrevious TBI +-------+-----------+-------------+------------+------------+ Right  NonComp    .42          NonComp     .50          +-------+-----------+-------------+------------+------------+ Left   NonComp    .68 (2nd toe)NonComp     Amp          +-------+-----------+-------------+------------+------------+ Bilateral ABIs appear essentially unchanged compared to prior study on 09/2022.  Summary: Right: Resting right ankle-brachial index indicates noncompressible right lower extremity arteries. The right toe-brachial index is abnormal. Left: Resting left ankle-brachial index indicates noncompressible left lower extremity arteries. The left toe-brachial index is abnormal. Great toe amp; 2nd toe near normal TBI. *See table(s) above for measurements and observations.  Electronically signed by Cordella Shawl MD on 12/23/2023 at 7:24:13 AM.    Final     Labs:  CBC: Recent Labs    11/11/23 1930 11/12/23 0631 11/13/23 0652 11/14/23 0552  WBC 12.1* 8.1 6.3 6.8  HGB 12.8* 10.8* 11.6* 11.4*  HCT 38.8* 32.9* 36.8* 35.1*  PLT 81* 76* 85* 95*     COAGS: Recent Labs    08/17/23 0228 11/11/23 1930 11/12/23 1011  INR 1.4* 1.4* 1.3*    BMP: Recent Labs    11/11/23 1930 11/12/23 0631 11/13/23 0652 11/14/23 0552  NA 134* 134* 135 135  K 3.8 3.7 4.7 4.1  CL  94* 96* 94* 95*  CO2 25 24 22 25   GLUCOSE 148* 85 175* 112*  BUN 31* 35* 61* 35*  CALCIUM  8.1* 7.8* 8.0* 8.4*  CREATININE 4.93* 5.52* 7.54* 5.48*  GFRNONAA 12* 11* 7* 11*    LIVER FUNCTION TESTS: Recent Labs    08/25/23 0316 11/11/23 1930 11/12/23 0631 11/13/23 0821  BILITOT  --  4.1* 4.8* 2.4*  AST  --  54* 49* 187*  ALT  --  51* 42 97*  ALKPHOS  --  158* 134* 123  PROT  --  8.0 7.0 8.1  ALBUMIN  3.6 3.2* 2.7* 2.7*    TUMOR MARKERS: No results for input(s): AFPTM, CEA, CA199, CHROMGRNA in the last 8760 hours.  Assessment and Plan:  Acute cholecystitis s/p percutaneous cholecystostomy drain placement on 6/11. Patient is well overall, with expected discomfort from drain. Good daily bilious output into drainage bag. Patient may flush drain every 3 days, or sooner with concerns of decreased output or clogged drain. Patient is currently undergoing extensive cardiac evaluation and is pending TAVR procedure. He will have to obtain clearance by his cardiac team prior to undergoing potential cholecystectomy.  Keep follow-up appointment with Surgery service on 8/11. IR will see patient in clinic every 6-8 weeks for regular drain exchanges until he is cleared for cholecystectomy, or alternate plan by care team. Drain was exchanged today by Dr. Johann. Return in 6-8 weeks, on a recurring basis, for drain exchanges. Return to clinic precautions given to patient and his wife. Both patient and his wife voiced understanding and are amenable to this plan.   Electronically Signed: Carlin LABOR Betsabe Iglesia PA-C 12/31/2023, 5:56 PM   Please refer to Dr. Delinda attestation of this note for management and plan.

## 2024-01-01 ENCOUNTER — Other Ambulatory Visit: Payer: Self-pay | Admitting: Interventional Radiology

## 2024-01-01 DIAGNOSIS — K8020 Calculus of gallbladder without cholecystitis without obstruction: Secondary | ICD-10-CM

## 2024-01-02 ENCOUNTER — Other Ambulatory Visit: Payer: Self-pay

## 2024-01-02 ENCOUNTER — Telehealth (HOSPITAL_COMMUNITY): Payer: Self-pay | Admitting: Student

## 2024-01-02 MED ORDER — SODIUM CHLORIDE FLUSH 0.9 % IV SOLN
10.0000 mL | INTRAVENOUS | 2 refills | Status: DC
  Filled 2024-01-02 – 2024-01-12 (×3): qty 100, 30d supply, fill #0

## 2024-01-02 NOTE — Telephone Encounter (Signed)
 Patient with a history of percutaneous cholecystostomy with recent drain exchange 12/31/23. Patient called the clinic today stating he was having some tenderness around the drain site. The skin insertion site is not red or warm. He denied fevers, chills, nausea or diffuse abdominal pain. He notes drain output in the gravity bag. He is was instructed to flush the drain every three days. Patient states he took some tylenol  yesterday and that helped.   I reassured the patient that some discomfort following a drain exchange is not unexpected and to see how he feels over the next few days. If he is still having this discomfort early  next week he should call us  back at the clinic. He was instructed to seek medical attention over the weekend if he develops troubling symptoms like abdominal pain, fevers, chills, etc. Patient verbalized understanding and agreement of this plan. The patient's wife requested a refill of saline flushes and this was e-prescribed to the [facility] outpatient pharmacy.   Sherrine Salberg, AGACNP-BC 01/02/2024, 11:17 AM

## 2024-01-06 ENCOUNTER — Telehealth: Payer: Self-pay | Admitting: Surgery

## 2024-01-07 NOTE — Unmapped External Note (Signed)
 [facility]IONAL RAD[facility]OLOG[facility] - Operative Note   VIR Post-Procedure Note  Procedure Name: Tunneled HD catheter removal  Pre-Op  Diagnosis: ESRD with functioning LUE AVF  Post-Op Diagnosis: Same as pre-operative diagnosis  VIR Providers  Attending Physician: Dr. Barton Purdue Pisanie  Fellow/Resident: Antonina SHAUNNA Camp, MD  Time out: Prior to the procedure, a time out was performed with all team members present. During the time out, the patient, procedure and procedure site when applicable were verbally verified by the team members and Antonina SHAUNNA Camp, MD.  Description of procedure: Successful removal of right sided tunneled HD catheter.  Sedation:None  Estimated Blood Loss: approximately 1 mL Complications: None  See detailed procedure note with images in [facility]).  The patient tolerated the procedure well without incident or complication and left the room in stable condition.  Antonina SHAUNNA Camp, MD 01/07/2024 9:43 AM

## 2024-01-07 NOTE — Telephone Encounter (Signed)
 Appt has been scheduled.

## 2024-01-09 ENCOUNTER — Ambulatory Visit: Attending: Surgery | Admitting: Surgery

## 2024-01-09 ENCOUNTER — Telehealth: Payer: Self-pay | Admitting: *Deleted

## 2024-01-09 ENCOUNTER — Encounter: Payer: Self-pay | Admitting: Surgery

## 2024-01-09 ENCOUNTER — Other Ambulatory Visit: Payer: Self-pay | Admitting: *Deleted

## 2024-01-09 VITALS — BP 96/59 | HR 76 | Temp 97.7°F

## 2024-01-09 DIAGNOSIS — I96 Gangrene, not elsewhere classified: Secondary | ICD-10-CM

## 2024-01-09 DIAGNOSIS — I7025 Atherosclerosis of native arteries of other extremities with ulceration: Secondary | ICD-10-CM

## 2024-01-09 NOTE — Progress Notes (Signed)
 [facility] [place]  Patient name: Phillip [name] MRN: [id number] DOB: 08-07-1955 Sex: male   REASON FOR VISIT:    Gangrenous left toe  HISOTRY OF PRESENT ILLNESS:    Phillip [name] is a 68 y.o. male who is well-known to us  for his dialysis access having recently undergone fistula creation.  He has developed a gangrenous left toe.  He underwent angiography and [place] with Dr. Marea and had angioplasty and stenting of the left tibioperoneal trunk.  Because of his comorbidities, toe amputation could not be done at [facility] and so he was referred to [facility] due to anesthesia.   PAST MEDICAL HISTORY:   Past Medical History:  Diagnosis Date   Abdominopelvic abscess (HCC) 09/29/2018   Abscess of appendix 09/22/2018   Acquired spondylolisthesis of lumbosacral region 03/20/2020   Acute cholecystitis 11/15/2023   Acute kidney injury (HCC) 10/07/2018   Acute kidney injury superimposed on chronic kidney disease (HCC) 11/06/2020   Acute kidney injury superimposed on CKD (HCC) 02/18/2020   Acute on chronic systolic (congestive) heart failure (HCC) 07/26/2015   Formatting of this note might be different from the original. Formatting of this note might be different from the original. Formatting of this note might be different from the original. revenkar EF 35% Formatting of this note might be different from the original. revenkar EF 35%     Acute on chronic systolic CHF (congestive heart failure) (HCC) 08/12/2023   Anasarca 07/01/2023   Anemia of chronic disease 08/18/2019   Anemia, chronic disease 08/18/2019   Aortic atherosclerosis (HCC)    Aortic stenosis    a.) TTE 09/15/2018: mild-mod (MPG 19); b.) TTE 03/21/2021: mild-mod (MPG 14.3); c.) TTE 04/24/2022: mild- mod (MPG 15)   Aortic stenosis, moderate 10/15/2019   Appendicitis 09/08/2018   ARF (acute renal failure) (HCC) 02/19/2020   Asthma    Atherosclerosis of native  arteries of the extremities with ulceration (HCC) 02/24/2018   Formatting of this note might be different from the original.  Last Assessment & Plan:   His ABIs today are 1.07 on the right and 0.99 on the left with biphasic waveforms.  Although these pressures may be somewhat elevated from medial calcification, his flow does appear to be reasonably good.  His left ABI was 0.58 prior to intervention.  At this point, we will stretch out his follow-up and see hi   Benign hypertension with CKD (chronic kidney disease) stage IV (HCC) 02/24/2018   Benign prostatic hyperplasia without lower urinary tract symptoms 01/14/2018   Bilateral lower extremity edema 11/04/2018   Bradycardia 03/25/2018   Bruit of right carotid artery 07/26/2015   CAD (coronary artery disease) 2003   a.) s/p 4v CABG 2003   Cardiac murmur 07/26/2015   Choledocholithiasis 11/12/2023   Chronic combined systolic and diastolic CHF (congestive heart failure) (HCC) 07/26/2015   a.) TTE 09/15/2018: EF 50-55%, mod LVH, RVE, BAE, mild-mod TR, AoV sclerosis, G1DD; b.) MPI 10/27/2019: EF <30%; c.) TTE 03/21/2021: EF 35-40%, post AK, inf HK, mod LVH, mod red RVSF, mod LAE, mod Aov sclerosis, G2DD; c.) TTE 04/24/2022: EF 35-40%, post AK, glok HK, mod LVE, mod red RVSF, mild-mod MR, Aov sclerosis, G3DD   Chronic idiopathic constipation 11/02/2018   Chronic low back pain without sciatica 12/02/2018   Chronic pain of right hip 03/31/2020   Chronic systolic heart failure (HCC) 07/26/2015   Formatting of this note might be different from the original. revenkar EF 35%  CKD (chronic kidney disease) stage 4, GFR 15-29 ml/min (HCC) 10/07/2018   Congestive heart failure (HCC) 07/26/2015   Last Assessment & Plan:   Poor cardiac function could certainly be contributing to poor blood supply to the feet and toes as well.     Formatting of this note might be different from the original.  Formatting of this note might be different from the original.  Last  Assessment & Plan:   Poor cardiac function could certainly be contributing to poor blood supply to the feet and toes as well.     Last   Coronary artery disease    Diabetes mellitus without complication (HCC)    Dysphagia 07/26/2015   Encounter for long-term (current) use of insulin  (HCC) 09/27/2020   Erectile dysfunction 07/14/2017   ESRD on dialysis [name]) 11/15/2023   Essential hypertension 02/24/2018   Fever 11/07/2020   Foot ulcer (HCC) 09/27/2020   left foot  Formatting of this note might be different from the original.  Formatting of this note might be different from the original.  left foot     Foot ulcer, left (HCC)    GERD (gastroesophageal reflux disease)    Heart murmur 07/26/2015   Hematuria 07/27/2015   High risk medication use 07/26/2015   History of marijuana use    Hyperkalemia 05/29/2020   Hyperlipidemia 07/26/2015   Hyperlipidemia associated with type 2 diabetes mellitus (HCC) 02/18/2020   Hypertension    Hypertension associated with diabetes (HCC)    Hypoglycemia 05/07/2019   Hypothyroidism 11/14/2015   Hypothyroidism (acquired) 11/14/2015   Insomnia 11/02/2018   Kidney insufficiency    Lumbar spondylosis 03/20/2020   Malaise and fatigue 03/25/2018   Malnutrition of moderate degree 08/20/2023   Mitral regurgitation 05/12/2019   Moderate aortic regurgitation 05/12/2019   Myocardial infarction due to demand ischemia (HCC) 09/26/2020   a.) Type II NSTEMI; b.) troponins were trended 0.54 --> 0.56 --> 0.52 ng/mL   Non-compliance 05/05/2019   NSTEMI (non-ST elevated myocardial infarction) (HCC) 09/27/2020   Osseous and subluxation stenosis of intervertebral foramina of lumbar region 03/20/2020   Osteoarthritis 10/06/2019   Pancytopenia (HCC) 08/12/2023   Peripheral vascular disease (HCC)    Personal history of tobacco use, presenting hazards to health 09/27/2020   Polyneuropathy in diabetes (HCC) 05/07/2019   Primary osteoarthritis involving multiple joints  10/06/2019   Primary osteoarthritis of right hip 02/21/2020   Pulmonary HTN (HCC)    a.) TTE 05/12/2019: PASP 71; b.) TTE 09/27/2020: PASP >70; c.) TTE 03/21/2021: RVSP 43; d.) TTE 04/24/2022: RVSP 80.6   Puncture wound of right hip 04/02/2018   PVD (peripheral vascular disease) with claudication (HCC)    a.) s/p PTA 03/02/2018 - balloon angioplasty LEFT below knee popliteal artery; b.) s/p PTA 09/30/2022: baloon angioplasty LEFT tibioperoneal trunck, most proximal peroneal artery, and LEFT popliteal artery.   Regional wall motion abnormality of heart 05/07/2019   Right groin pain 02/18/2020   S/P CABG x 4 2003   Screening for colon cancer 02/18/2018   Sepsis (HCC) 11/15/2023   Sleep apnea 07/26/2015   a.) does not require nocturnal PAP therapy   Subacute osteomyelitis of left foot (HCC) 03/25/2018   Thrombocytopenia (HCC)    Type 2 diabetes mellitus with hyperlipidemia (HCC) 07/26/2015   Type 2 diabetes mellitus with stage 4 chronic kidney disease, with long-term current use of insulin  (HCC) 07/26/2015   Type II diabetes mellitus with neurological manifestations (HCC) 02/18/2020   Vitamin B12 deficiency 06/16/2019   Vitamin D  deficiency 05/07/2018     FAMILY HISTORY:   Family History  Problem Relation Age of Onset   Hyperlipidemia Mother    Hypertension Mother    Diabetes Mother    Heart attack Brother    Heart disease Brother    Lung disease Father     SOCIAL HISTORY:   Social History   Tobacco Use   Smoking status: Former    Current packs/day: 0.00    Average packs/day: 0.5 packs/day for 25.0 years (12.5 ttl pk-yrs)    Types: Cigarettes    Start date: 46    Quit date: 2003    Years since quitting: 22.6   Smokeless tobacco: Never  Substance Use Topics   Alcohol use: Not Currently    Comment: 2003     ALLERGIES:   No Known Allergies   CURRENT MEDICATIONS:   Current Outpatient Medications  Medication Sig Dispense Refill   acetaminophen  (TYLENOL ) 500  MG tablet Take 1,000 mg by mouth every 6 (six) hours as needed for moderate pain or headache.      aspirin  EC 81 MG tablet Take 1 tablet (81 mg total) by mouth daily. Swallow whole.     atorvastatin  (LIPITOR) 20 MG tablet Take 1 tablet (20 mg total) by mouth daily. 90 tablet 3   carvedilol  (COREG ) 3.125 MG tablet Take 1 tablet (3.125 mg total) by mouth 2 (two) times daily. 180 tablet 3   clopidogrel  (PLAVIX ) 75 MG tablet Take 1 tablet (75 mg total) by mouth daily. 30 tablet 11   gabapentin  (NEURONTIN ) 300 MG capsule Take 1 capsule (300 mg total) by mouth at bedtime. (Patient taking differently: Take 300 mg by mouth 2 (two) times daily.) 30 capsule 0   insulin  glargine (LANTUS ) 100 UNIT/ML injection Inject 7 Units into the skin daily as needed (Diabetes). Inject 7 units at bedtime as needed after checking glucose     isosorbide  mononitrate (IMDUR ) 120 MG 24 hr tablet Take 1 tablet (120 mg total) by mouth daily. 90 tablet 3   latanoprost  (XALATAN ) 0.005 % ophthalmic solution Place 1 drop into both eyes at bedtime.     levothyroxine  (SYNTHROID ) 50 MCG tablet Take 50 mcg by mouth daily.     nitroGLYCERIN  (NITROSTAT ) 0.4 MG SL tablet Place 1 tablet (0.4 mg total) under the tongue every 5 (five) minutes as needed for chest pain. 25 tablet 6   sodium chloride  flush (NS) 0.9 % SOLN Inject 5 mLs into the vein daily for 15 days. flush drain daily with 5 cc NS, record output daily, dressing changes every 2-3 days or earlier if soiled 75 mL 0   sodium chloride  flush 0.9 % SOLN injection 10 mLs by Intracatheter route every 3 (three) days. 100 mL 2   tamsulosin  (FLOMAX ) 0.4 MG CAPS capsule Take 0.4 mg by mouth daily after breakfast.     vitamin B-12 (CYANOCOBALAMIN ) 500 MCG tablet Take 500 mcg by mouth daily.      Vitamin D, Ergocalciferol, (DRISDOL) 1.25 MG (50000 UNIT) CAPS capsule Take 50,000 Units by mouth once a week. Mondays     Current Facility-Administered Medications  Medication Dose Route Frequency  Provider Last Rate Last Admin   sodium chloride  flush (NS) 0.9 % injection 10 mL  10 mL Intracatheter Q3 days PRN         REVIEW OF SYSTEMS:   [X]  denotes positive finding, [ ]  denotes negative finding Cardiac  Comments:  Chest pain or chest pressure:    Shortness of breath  upon exertion:    Short of breath when lying flat:    Irregular heart rhythm:        Vascular    Pain in calf, thigh, or hip brought on by ambulation:    Pain in feet at night that wakes you up from your sleep:     Blood clot in your veins:    Leg swelling:         Pulmonary    Oxygen at home:    Productive cough:     Wheezing:         Neurologic    Sudden weakness in arms or legs:     Sudden numbness in arms or legs:     Sudden onset of difficulty speaking or slurred speech:    Temporary loss of vision in one eye:     Problems with dizziness:         Gastrointestinal    Blood in stool:     Vomited blood:         Genitourinary    Burning when urinating:     Blood in urine:        Psychiatric    Major depression:         Hematologic    Bleeding problems:    Problems with blood clotting too easily:        Skin    Rashes or ulcers:        Constitutional    Fever or chills:      PHYSICAL EXAM:   Vitals:   01/09/24 1215  BP: (!) 96/59  Pulse: 76  Temp: 97.7 F (36.5 C)  SpO2: 97%    GENERAL: The patient is a well-nourished male, in no acute distress. The vital signs are documented above. CARDIAC: There is a regular rate and rhythm.  PULMONARY: Non-labored respirations ABDOMEN: Soft and non-tender with normal pitched bowel sounds.  MUSCULOSKELETAL: There are no major deformities or cyanosis. NEUROLOGIC: No focal weakness or paresthesias are detected. PSYCHIATRIC: The patient has a normal affect.  STUDIES:     MEDICAL ISSUES:   Left third toe gangrene: I discussed with the patient that we will proceed with left third toe amputation using ankle block anesthesia.  He is on  Plavix  and this cannot be discontinued because he just had a stent placed in his tibioperoneal trunk.  I did discuss the possibility that this may not heal and he would require more proximal potation.  He needs to get this done so that he can be considered for TAVR.  In addition this is causing him significant amount of pain.  I have him scheduled for Wednesday, August 13    Malvina Serene CLORE, MD, FACS [facility] [phone] Pager [phone]

## 2024-01-09 NOTE — H&P (View-Only) (Signed)
 [facility] [place]  Patient name: Phillip [name] MRN: [id number] DOB: 08-07-1955 Sex: male   REASON FOR VISIT:    Gangrenous left toe  HISOTRY OF PRESENT ILLNESS:    Phillip [name] is a 68 y.o. male who is well-known to us  for his dialysis access having recently undergone fistula creation.  He has developed a gangrenous left toe.  He underwent angiography and [place] with Dr. Marea and had angioplasty and stenting of the left tibioperoneal trunk.  Because of his comorbidities, toe amputation could not be done at [facility] and so he was referred to [facility] due to anesthesia.   PAST MEDICAL HISTORY:   Past Medical History:  Diagnosis Date   Abdominopelvic abscess (HCC) 09/29/2018   Abscess of appendix 09/22/2018   Acquired spondylolisthesis of lumbosacral region 03/20/2020   Acute cholecystitis 11/15/2023   Acute kidney injury (HCC) 10/07/2018   Acute kidney injury superimposed on chronic kidney disease (HCC) 11/06/2020   Acute kidney injury superimposed on CKD (HCC) 02/18/2020   Acute on chronic systolic (congestive) heart failure (HCC) 07/26/2015   Formatting of this note might be different from the original. Formatting of this note might be different from the original. Formatting of this note might be different from the original. revenkar EF 35% Formatting of this note might be different from the original. revenkar EF 35%     Acute on chronic systolic CHF (congestive heart failure) (HCC) 08/12/2023   Anasarca 07/01/2023   Anemia of chronic disease 08/18/2019   Anemia, chronic disease 08/18/2019   Aortic atherosclerosis (HCC)    Aortic stenosis    a.) TTE 09/15/2018: mild-mod (MPG 19); b.) TTE 03/21/2021: mild-mod (MPG 14.3); c.) TTE 04/24/2022: mild- mod (MPG 15)   Aortic stenosis, moderate 10/15/2019   Appendicitis 09/08/2018   ARF (acute renal failure) (HCC) 02/19/2020   Asthma    Atherosclerosis of native  arteries of the extremities with ulceration (HCC) 02/24/2018   Formatting of this note might be different from the original.  Last Assessment & Plan:   His ABIs today are 1.07 on the right and 0.99 on the left with biphasic waveforms.  Although these pressures may be somewhat elevated from medial calcification, his flow does appear to be reasonably good.  His left ABI was 0.58 prior to intervention.  At this point, we will stretch out his follow-up and see hi   Benign hypertension with CKD (chronic kidney disease) stage IV (HCC) 02/24/2018   Benign prostatic hyperplasia without lower urinary tract symptoms 01/14/2018   Bilateral lower extremity edema 11/04/2018   Bradycardia 03/25/2018   Bruit of right carotid artery 07/26/2015   CAD (coronary artery disease) 2003   a.) s/p 4v CABG 2003   Cardiac murmur 07/26/2015   Choledocholithiasis 11/12/2023   Chronic combined systolic and diastolic CHF (congestive heart failure) (HCC) 07/26/2015   a.) TTE 09/15/2018: EF 50-55%, mod LVH, RVE, BAE, mild-mod TR, AoV sclerosis, G1DD; b.) MPI 10/27/2019: EF <30%; c.) TTE 03/21/2021: EF 35-40%, post AK, inf HK, mod LVH, mod red RVSF, mod LAE, mod Aov sclerosis, G2DD; c.) TTE 04/24/2022: EF 35-40%, post AK, glok HK, mod LVE, mod red RVSF, mild-mod MR, Aov sclerosis, G3DD   Chronic idiopathic constipation 11/02/2018   Chronic low back pain without sciatica 12/02/2018   Chronic pain of right hip 03/31/2020   Chronic systolic heart failure (HCC) 07/26/2015   Formatting of this note might be different from the original. revenkar EF 35%  CKD (chronic kidney disease) stage 4, GFR 15-29 ml/min (HCC) 10/07/2018   Congestive heart failure (HCC) 07/26/2015   Last Assessment & Plan:   Poor cardiac function could certainly be contributing to poor blood supply to the feet and toes as well.     Formatting of this note might be different from the original.  Formatting of this note might be different from the original.  Last  Assessment & Plan:   Poor cardiac function could certainly be contributing to poor blood supply to the feet and toes as well.     Last   Coronary artery disease    Diabetes mellitus without complication (HCC)    Dysphagia 07/26/2015   Encounter for long-term (current) use of insulin  (HCC) 09/27/2020   Erectile dysfunction 07/14/2017   ESRD on dialysis [name]) 11/15/2023   Essential hypertension 02/24/2018   Fever 11/07/2020   Foot ulcer (HCC) 09/27/2020   left foot  Formatting of this note might be different from the original.  Formatting of this note might be different from the original.  left foot     Foot ulcer, left (HCC)    GERD (gastroesophageal reflux disease)    Heart murmur 07/26/2015   Hematuria 07/27/2015   High risk medication use 07/26/2015   History of marijuana use    Hyperkalemia 05/29/2020   Hyperlipidemia 07/26/2015   Hyperlipidemia associated with type 2 diabetes mellitus (HCC) 02/18/2020   Hypertension    Hypertension associated with diabetes (HCC)    Hypoglycemia 05/07/2019   Hypothyroidism 11/14/2015   Hypothyroidism (acquired) 11/14/2015   Insomnia 11/02/2018   Kidney insufficiency    Lumbar spondylosis 03/20/2020   Malaise and fatigue 03/25/2018   Malnutrition of moderate degree 08/20/2023   Mitral regurgitation 05/12/2019   Moderate aortic regurgitation 05/12/2019   Myocardial infarction due to demand ischemia (HCC) 09/26/2020   a.) Type II NSTEMI; b.) troponins were trended 0.54 --> 0.56 --> 0.52 ng/mL   Non-compliance 05/05/2019   NSTEMI (non-ST elevated myocardial infarction) (HCC) 09/27/2020   Osseous and subluxation stenosis of intervertebral foramina of lumbar region 03/20/2020   Osteoarthritis 10/06/2019   Pancytopenia (HCC) 08/12/2023   Peripheral vascular disease (HCC)    Personal history of tobacco use, presenting hazards to health 09/27/2020   Polyneuropathy in diabetes (HCC) 05/07/2019   Primary osteoarthritis involving multiple joints  10/06/2019   Primary osteoarthritis of right hip 02/21/2020   Pulmonary HTN (HCC)    a.) TTE 05/12/2019: PASP 71; b.) TTE 09/27/2020: PASP >70; c.) TTE 03/21/2021: RVSP 43; d.) TTE 04/24/2022: RVSP 80.6   Puncture wound of right hip 04/02/2018   PVD (peripheral vascular disease) with claudication (HCC)    a.) s/p PTA 03/02/2018 - balloon angioplasty LEFT below knee popliteal artery; b.) s/p PTA 09/30/2022: baloon angioplasty LEFT tibioperoneal trunck, most proximal peroneal artery, and LEFT popliteal artery.   Regional wall motion abnormality of heart 05/07/2019   Right groin pain 02/18/2020   S/P CABG x 4 2003   Screening for colon cancer 02/18/2018   Sepsis (HCC) 11/15/2023   Sleep apnea 07/26/2015   a.) does not require nocturnal PAP therapy   Subacute osteomyelitis of left foot (HCC) 03/25/2018   Thrombocytopenia (HCC)    Type 2 diabetes mellitus with hyperlipidemia (HCC) 07/26/2015   Type 2 diabetes mellitus with stage 4 chronic kidney disease, with long-term current use of insulin  (HCC) 07/26/2015   Type II diabetes mellitus with neurological manifestations (HCC) 02/18/2020   Vitamin B12 deficiency 06/16/2019   Vitamin D  deficiency 05/07/2018     FAMILY HISTORY:   Family History  Problem Relation Age of Onset   Hyperlipidemia Mother    Hypertension Mother    Diabetes Mother    Heart attack Brother    Heart disease Brother    Lung disease Father     SOCIAL HISTORY:   Social History   Tobacco Use   Smoking status: Former    Current packs/day: 0.00    Average packs/day: 0.5 packs/day for 25.0 years (12.5 ttl pk-yrs)    Types: Cigarettes    Start date: 46    Quit date: 2003    Years since quitting: 22.6   Smokeless tobacco: Never  Substance Use Topics   Alcohol use: Not Currently    Comment: 2003     ALLERGIES:   No Known Allergies   CURRENT MEDICATIONS:   Current Outpatient Medications  Medication Sig Dispense Refill   acetaminophen  (TYLENOL ) 500  MG tablet Take 1,000 mg by mouth every 6 (six) hours as needed for moderate pain or headache.      aspirin  EC 81 MG tablet Take 1 tablet (81 mg total) by mouth daily. Swallow whole.     atorvastatin  (LIPITOR) 20 MG tablet Take 1 tablet (20 mg total) by mouth daily. 90 tablet 3   carvedilol  (COREG ) 3.125 MG tablet Take 1 tablet (3.125 mg total) by mouth 2 (two) times daily. 180 tablet 3   clopidogrel  (PLAVIX ) 75 MG tablet Take 1 tablet (75 mg total) by mouth daily. 30 tablet 11   gabapentin  (NEURONTIN ) 300 MG capsule Take 1 capsule (300 mg total) by mouth at bedtime. (Patient taking differently: Take 300 mg by mouth 2 (two) times daily.) 30 capsule 0   insulin  glargine (LANTUS ) 100 UNIT/ML injection Inject 7 Units into the skin daily as needed (Diabetes). Inject 7 units at bedtime as needed after checking glucose     isosorbide  mononitrate (IMDUR ) 120 MG 24 hr tablet Take 1 tablet (120 mg total) by mouth daily. 90 tablet 3   latanoprost  (XALATAN ) 0.005 % ophthalmic solution Place 1 drop into both eyes at bedtime.     levothyroxine  (SYNTHROID ) 50 MCG tablet Take 50 mcg by mouth daily.     nitroGLYCERIN  (NITROSTAT ) 0.4 MG SL tablet Place 1 tablet (0.4 mg total) under the tongue every 5 (five) minutes as needed for chest pain. 25 tablet 6   sodium chloride  flush (NS) 0.9 % SOLN Inject 5 mLs into the vein daily for 15 days. flush drain daily with 5 cc NS, record output daily, dressing changes every 2-3 days or earlier if soiled 75 mL 0   sodium chloride  flush 0.9 % SOLN injection 10 mLs by Intracatheter route every 3 (three) days. 100 mL 2   tamsulosin  (FLOMAX ) 0.4 MG CAPS capsule Take 0.4 mg by mouth daily after breakfast.     vitamin B-12 (CYANOCOBALAMIN ) 500 MCG tablet Take 500 mcg by mouth daily.      Vitamin D, Ergocalciferol, (DRISDOL) 1.25 MG (50000 UNIT) CAPS capsule Take 50,000 Units by mouth once a week. Mondays     Current Facility-Administered Medications  Medication Dose Route Frequency  Provider Last Rate Last Admin   sodium chloride  flush (NS) 0.9 % injection 10 mL  10 mL Intracatheter Q3 days PRN         REVIEW OF SYSTEMS:   [X]  denotes positive finding, [ ]  denotes negative finding Cardiac  Comments:  Chest pain or chest pressure:    Shortness of breath  upon exertion:    Short of breath when lying flat:    Irregular heart rhythm:        Vascular    Pain in calf, thigh, or hip brought on by ambulation:    Pain in feet at night that wakes you up from your sleep:     Blood clot in your veins:    Leg swelling:         Pulmonary    Oxygen at home:    Productive cough:     Wheezing:         Neurologic    Sudden weakness in arms or legs:     Sudden numbness in arms or legs:     Sudden onset of difficulty speaking or slurred speech:    Temporary loss of vision in one eye:     Problems with dizziness:         Gastrointestinal    Blood in stool:     Vomited blood:         Genitourinary    Burning when urinating:     Blood in urine:        Psychiatric    Major depression:         Hematologic    Bleeding problems:    Problems with blood clotting too easily:        Skin    Rashes or ulcers:        Constitutional    Fever or chills:      PHYSICAL EXAM:   Vitals:   01/09/24 1215  BP: (!) 96/59  Pulse: 76  Temp: 97.7 F (36.5 C)  SpO2: 97%    GENERAL: The patient is a well-nourished male, in no acute distress. The vital signs are documented above. CARDIAC: There is a regular rate and rhythm.  PULMONARY: Non-labored respirations ABDOMEN: Soft and non-tender with normal pitched bowel sounds.  MUSCULOSKELETAL: There are no major deformities or cyanosis. NEUROLOGIC: No focal weakness or paresthesias are detected. PSYCHIATRIC: The patient has a normal affect.  STUDIES:     MEDICAL ISSUES:   Left third toe gangrene: I discussed with the patient that we will proceed with left third toe amputation using ankle block anesthesia.  He is on  Plavix  and this cannot be discontinued because he just had a stent placed in his tibioperoneal trunk.  I did discuss the possibility that this may not heal and he would require more proximal potation.  He needs to get this done so that he can be considered for TAVR.  In addition this is causing him significant amount of pain.  I have him scheduled for Wednesday, August 13    Malvina Serene CLORE, MD, FACS [facility] [phone] Pager [phone]

## 2024-01-09 NOTE — Telephone Encounter (Signed)
 Attempted to call for surgery scheduling. LVM

## 2024-01-12 ENCOUNTER — Other Ambulatory Visit: Payer: Self-pay

## 2024-01-12 ENCOUNTER — Ambulatory Visit (INDEPENDENT_AMBULATORY_CARE_PROVIDER_SITE_OTHER): Admitting: Vascular Surgery

## 2024-01-12 NOTE — Progress Notes (Signed)
 PROVIDER:  SHELBY LYNN ALLEN, MD  MRN: [id number] DOB: 02/28/1956 DATE OF ENCOUNTER: 01/12/2024 Subjective     Chief Complaint: Post Operative Visit     History of Present Illness: Phillip [name] is a 68 y.o. male who is seen today for follow up of cholecystitis. He was admitted in June of this year with acute cholecystitis. He also has HFrEF, PAD, and severe aortic stenosis, and was scheduled for a TAVR in June. Due to his medical comorbidities, his cholecystitis was treated with a percutaneous cholecystostomy tube. His TAVR ultimately was cancelled due to gangrene of the toe. He is scheduled for a toe amputation later this week with vascular surgery, and his TAVR has been deferred until this is completed.   Today he denies any abdominal pain. He is eating without difficulty and does not have postprandial pain. He recently had a drain study on 7/30, which did not show any filling of the cystic duct.  The catheter was exchanged and the drain remains to gravity.     Review of Systems: A complete review of systems was obtained from the patient.  I have reviewed this information and discussed as appropriate with the patient.  See HPI as well for other ROS.   Medical History: Past Medical History:  Diagnosis Date  . Diabetes mellitus type 2, uncomplicated (CMS/HHS-HCC)   . Heart disease   . Hyperlipidemia   . Hypertension     Patient Active Problem List  Diagnosis  . Atherosclerosis of native arteries of the extremities with ulceration (CMS/HHS-HCC)  . Benign prostatic hyperplasia without lower urinary tract symptoms  . Bruit of right carotid artery  . CAD (coronary artery disease)  . Cardiac murmur  . CHF (congestive heart failure) (CMS/HHS-HCC)  . CKD (chronic kidney disease) stage 3, GFR 30-59 ml/min (CMS/HHS-HCC)  . Primary osteoarthritis of right hip  . Lumbar spondylosis  . Acquired spondylolisthesis of lumbosacral region  . Osseous and subluxation stenosis of  intervertebral foramina of lumbar region  . Abdominopelvic abscess (CMS/HHS-HCC)  . Acute on chronic systolic (congestive) heart failure (CMS/HHS-HCC)  . AKI (acute kidney injury) ()  . Anemia in other chronic diseases classified elsewhere  . Aortic stenosis, moderate  . Abscess of appendix  . ARF (acute renal failure) ()  . Asthma (HHS-HCC)  . Bilateral lower extremity edema  . Bradycardia  . Chronic idiopathic constipation  . Chronic low back pain without sciatica  . Chronic pain of right hip  . Encounter for long-term (current) use of insulin  (CMS/HHS-HCC)  . Fever  . Foot ulcer (CMS/HHS-HCC)  . GERD (gastroesophageal reflux disease)  . Hyperlipidemia associated with type 2 diabetes mellitus (CMS/HHS-HCC)  . Hyperkalemia  . Hypoglycemia  . Hypothyroidism  . Insomnia  . Malaise and fatigue  . Mitral regurgitation  . Moderate aortic regurgitation  . Non-compliance  . NSTEMI (non-ST elevated myocardial infarction) (CMS/HHS-HCC)  . Essential hypertension  . Personal history of tobacco use, presenting hazards to health  . Polyneuropathy in diabetes (CMS/HHS-HCC)  . Primary osteoarthritis involving multiple joints  . Puncture wound of right hip  . Regional wall motion abnormality of heart  . Right groin pain  . Subacute osteomyelitis of left foot (CMS/HHS-HCC)  . Diabetes mellitus without complication (CMS/HHS-HCC)  . Type II diabetes mellitus with neurological manifestations (CMS/HHS-HCC)  . Vitamin B12 deficiency  . Vitamin D deficiency  . Acquired absence of other left toe(s) (CMS/HHS-HCC)  . Acute cholecystitis  . Allergy, unspecified, initial encounter  . Anaphylactic  shock, unspecified, initial encounter  . Anasarca  . Choledocholithiasis  . Dependence on renal dialysis ()  . End stage renal disease (CMS/HHS-HCC)  . History of marijuana use  . Hypertensive heart and chronic kidney disease with heart failure and with stage 5 chronic kidney disease, or end stage  renal disease (CMS/HHS-HCC)  . Moderate protein-calorie malnutrition (CMS/HHS-HCC)    Past Surgical History:  Procedure Laterality Date  . APPENDECTOMY       No Known Allergies  Current Outpatient Medications on File Prior to Visit  Medication Sig Dispense Refill  . acetaminophen  (TYLENOL ) 325 MG tablet Take 650 mg by mouth every 8 (eight) hours    . atorvastatin  (LIPITOR) 20 MG tablet Take 20 mg by mouth once daily    . blood-glucose meter Misc 1 each as directed    . bumetanide  (BUMEX ) 1 MG tablet Take 1 mg by mouth 2 (two) times daily    . carvediloL  (COREG ) 25 MG tablet Take 25 mg by mouth 2 (two) times daily with meals    . cloNIDine  HCl (CATAPRES ) 0.3 MG tablet Take 0.3 mg by mouth 2 (two) times daily (Patient not taking: Reported on 01/01/2024)  3  . clopidogrel  (PLAVIX ) 75 mg tablet Take 75 mg by mouth once daily       . cyanocobalamin  (VITAMIN B12) 500 MCG tablet Take 500 mcg by mouth once daily       . dorzolamide (TRUSOPT) 2 % ophthalmic solution 1 drop    . doxycycline  (VIBRA -TABS) 100 MG tablet Take 100 mg by mouth 2 (two) times daily    . ergocalciferol, vitamin D2, 1,250 mcg (50,000 unit) capsule Take 50,000 Units by mouth once a week    . gabapentin  (NEURONTIN ) 600 MG tablet Take 600 mg by mouth 2 (two) times daily    . insulin  DETEMIR (LEVEMIR  FLEXTOUCH) pen injector (concentration 100 units/mL) Inject 40 Units subcutaneously once daily As needed    . insulin  GLARGINE (LANTUS ) injection (concentration 100 units/mL) Inject 7 Units subcutaneously    . isosorbide  mononitrate (IMDUR ) 120 MG ER tablet Take 120 mg by mouth once daily       . lancets Use 1 each once daily    . latanoprost  (XALATAN ) 0.005 % ophthalmic solution INSTILL 1 DROP INTO EACH EYE ONCE DAILY AT NIGHT    . levothyroxine  (SYNTHROID ) 25 MCG tablet Take 25 mcg by mouth once daily Take on an empty stomach with a glass of water at least 30-60 minutes before breakfast.    . levothyroxine  (SYNTHROID ) 50 MCG  tablet Take 50 mcg by mouth once daily    . losartan  (COZAAR ) 25 MG tablet Take 25 mg by mouth once daily    . lovastatin  (MEVACOR ) 20 MG tablet Take 20 mg by mouth at bedtime    . nitroGLYcerin  (NITROSTAT ) 0.4 MG SL tablet Place 0.4 mg under the tongue every 5 (five) minutes as needed       . ONETOUCH DELICA PLUS LANCET Use 1 each 2 (two) times daily    . ONETOUCH VERIO TEST STRIPS test strip 1 strip once daily    . oxyCODONE -acetaminophen  (PERCOCET) 5-325 mg tablet Take 1 tablet by mouth every 6 (six) hours as needed for Pain 30 tablet 0  . tamsulosin  (FLOMAX ) 0.4 mg capsule Take 0.4 mg by mouth once daily Take 30 minutes after same meal each day.     No current facility-administered medications on file prior to visit.    Family History  Problem Relation  Age of Onset  . No Known Problems Mother   . No Known Problems Father      Social History   Tobacco Use  Smoking Status Former  Smokeless Tobacco Never     Social History   Socioeconomic History  . Marital status: Married  Tobacco Use  . Smoking status: Former  . Smokeless tobacco: Never  Substance and Sexual Activity  . Alcohol use: Not Currently  . Drug use: Never  . Sexual activity: Defer   Social Drivers of Health   Financial Resource Strain: Low Risk  (09/26/2020)   Received from [facility]   Overall Financial Resource Strain (CARDIA)   . Difficulty of Paying Living Expenses: Not hard at all  Food Insecurity: No Food Insecurity (11/12/2023)   Received from [facility]   Hunger Vital Sign   . Within the past 12 months, you worried that your food would run out before you got the money to buy more.: Never true   . Within the past 12 months, the food you bought just didn't last and you didn't have money to get more.: Never true  Transportation Needs: No Transportation Needs (11/12/2023)   Received from [facility] - Transportation   . Lack of Transportation (Medical): No   . Lack of Transportation  (Non-Medical): No  Physical Activity: Inactive (09/26/2020)   Received from [facility]   Exercise Vital Sign   . On average, how many days per week do you engage in moderate to strenuous exercise (like a brisk walk)?: 0 days   . On average, how many minutes do you engage in exercise at this level?: 0 min  Stress: No Stress Concern Present (09/26/2020)   Received from [facility] - Occupational Stress Questionnaire   . Feeling of Stress : Not at all  Social Connections: Moderately Integrated (11/12/2023)   Received from [facility]   Social Connection and Isolation Panel   . In a typical week, how many times do you talk on the phone with family, friends, or neighbors?: Three times a week   . How often do you get together with friends or relatives?: Once a week   . How often do you attend church or religious services?: 1 to 4 times per year   . Do you belong to any clubs or organizations such as church groups, unions, fraternal or athletic groups, or school groups?: No   . How often do you attend meetings of the clubs or organizations you belong to?: Never   . Are you married, widowed, divorced, separated, never married, or living with a partner?: Married  Housing Stability: Unknown (12/23/2023)   Housing Stability Vital Sign   . Homeless in the Last Year: No    Objective:    Vitals:   01/12/24 1029  BP: 93/56  Pulse: 79  Temp: 36.7 C (98 F)  SpO2: 96%  Weight: 81.6 kg (180 lb)  Height: 182.9 cm (6')    Body mass index is 24.41 kg/m.  Physical Exam Vitals reviewed.  Constitutional:      Appearance: Normal appearance.  Pulmonary:     Effort: Pulmonary effort is normal. No respiratory distress.  Abdominal:     General: There is no distension.     Palpations: Abdomen is soft.     Comments: RUQ cholecystostomy has purulent, nonbilious fluid.  Skin:    General: Skin is warm and dry.  Neurological:  General: No focal  deficit present.     Mental Status: He is alert and oriented to person, place, and time.        Assessment and Plan:     Diagnoses and all orders for this visit:  Cholecystitis, acute  Aortic stenosis, severe     68 yo male with severe AS and acute cholecystitis, now 2 months s/p percutaneous cholecystostomy. His TAVR has been delayed due to gangrene of the toe, for which he is to undergo amputation this week.  As he is not having any abdominal pain with his cholecystostomy in place, I do not recommend consideration of cholecystectomy until after his TAVR has been completed and he has been optimized from a cardiac standpoint.  His cystic duct did not appear patent on his recent drain study and there is no bilious fluid in his drain today, thus I recommend keeping his drain to gravity for now.  I will plan to see him back in about 3 months.  At that time we can discuss cholecystectomy if he has undergone TAVR, versus a trial of drain capping if his cystic duct is patent.  The patient and his family were instructed to call if he has any further questions or new abdominal pain in the meantime.  Questions were answered.  Return in about 3 months (around 04/13/2024).   SHELBY LYNN ALLEN, MD

## 2024-01-13 ENCOUNTER — Other Ambulatory Visit: Payer: Self-pay

## 2024-01-13 ENCOUNTER — Encounter (HOSPITAL_COMMUNITY): Payer: Self-pay | Admitting: Surgery

## 2024-01-13 NOTE — Progress Notes (Addendum)
 PCP - [name] LABOR., MD  Cardiologist - Revankar, Iretha Kirley SAUNDERS, MD   PPM/ICD - denies Device Orders - n/a Rep Notified - n/a  Chest x-ray - 11-11-23 EKG - 11-11-23 Stress Test -  ECHO - 08-01-23 Cardiac Cath - 08-01-23  CPAP - denies  GLP-1 -denies  Fasting Blood Sugar - Per patient blood sugar ranges 115-120 in am Checks Blood Sugar BID/ Last A1c 5.8 on 11-13-23   Blood Thinner Instructions: continue Aspirin  Instructions: continue  ERAS Protcol - NPO  COVID TEST- n/a  Anesthesia review: yes, Hx of Dm, MI, PVD, OSA  Patient verbally denies any shortness of breath, fever, cough and chest pain during phone call   -------------  SDW INSTRUCTIONS given:  Your procedure is scheduled on January 14 2024.  Report to [facility] Main Entrance A at 11:00 A.M., and check in at the Admitting office.  Call this number if you have problems the morning of surgery:  [phone]   Remember:  Do not eat or drink after midnight the night before your surgery      Take these medicines the morning of surgery with A SIP OF WATER  acetaminophen  (TYLENOL )  aspirin   atorvastatin  (LIPITOR)  carvedilol  (COREG )  clopidogrel  (PLAVIX )  gabapentin  (NEURONTIN )  isosorbide  mononitrate (IMDUR )  levothyroxine  (SYNTHROID )  nitroGLYCERIN  (NITROSTAT )  nitroGLYCERIN  (NITROSTAT )  traMADol  (ULTRAM )   As of today, STOP taking any Aspirin  (unless otherwise instructed by your surgeon) Aleve, Naproxen, Ibuprofen, Motrin, Advil, Goody's, BC's, all herbal medications, fish oil, and all vitamins.  WHAT DO I DO ABOUT MY DIABETES MEDICATION?   Do not take oral diabetes medicines (pills) the morning of surgery.  THE NIGHT BEFORE SURGERY, take 3 units of LANTUS  insulin  if needed. (Lantus  prn at night)      The day of surgery, do not take other diabetes injectables, including Byetta (exenatide), Bydureon (exenatide ER), Victoza (liraglutide), or Trulicity (dulaglutide).  If your CBG is greater than 220  mg/dL, you may take  of your sliding scale (correction) dose of insulin .   HOW TO MANAGE YOUR DIABETES BEFORE AND AFTER SURGERY  Why is it important to control my blood sugar before and after surgery? Improving blood sugar levels before and after surgery helps healing and can limit problems. A way of improving blood sugar control is eating a healthy diet by:  Eating less sugar and carbohydrates  Increasing activity/exercise  Talking with your doctor about reaching your blood sugar goals High blood sugars (greater than 180 mg/dL) can raise your risk of infections and slow your recovery, so you will need to focus on controlling your diabetes during the weeks before surgery. Make sure that the doctor who takes care of your diabetes knows about your planned surgery including the date and location.  How do I manage my blood sugar before surgery? Check your blood sugar at least 4 times a day, starting 2 days before surgery, to make sure that the level is not too high or low.  Check your blood sugar the morning of your surgery when you wake up and every 2 hours until you get to the Short Stay unit.  If your blood sugar is less than 70 mg/dL, you will need to treat for low blood sugar: Do not take insulin . Treat a low blood sugar (less than 70 mg/dL) with  cup of clear juice (cranberry or apple), 4 glucose tablets, OR glucose gel. Recheck blood sugar in 15 minutes after treatment (to make sure it is greater than 70  mg/dL). If your blood sugar is not greater than 70 mg/dL on recheck, call [phone] for further instructions. Report your blood sugar to the short stay nurse when you get to Short Stay.  If you are admitted to the hospital after surgery: Your blood sugar will be checked by the staff and you will probably be given insulin  after surgery (instead of oral diabetes medicines) to make sure you have good blood sugar levels. The goal for blood sugar control after surgery is 80-180  mg/dL.                      Do not wear jewelry, make up, or nail polish            Do not wear lotions, powders, perfumes/colognes, or deodorant.            Do not shave 48 hours prior to surgery.  Men may shave face and neck.            Do not bring valuables to the hospital.            [facility] is not responsible for any belongings or valuables.  Do NOT Smoke (Tobacco/Vaping) 24 hours prior to your procedure If you use a CPAP at night, you may bring all equipment for your overnight stay.   Contacts, glasses, dentures or bridgework may not be worn into surgery.      For patients admitted to the hospital, discharge time will be determined by your treatment team.   Patients discharged the day of surgery will not be allowed to drive home, and someone needs to stay with them for 24 hours.    Special instructions:   [place]- Preparing For Surgery  Before surgery, you can play an important role. Because skin is not sterile, your skin needs to be as free of germs as possible. You can reduce the number of germs on your skin by washing with CHG (chlorahexidine gluconate) Soap before surgery.  CHG is an antiseptic cleaner which kills germs and bonds with the skin to continue killing germs even after washing.    Oral Hygiene is also important to reduce your risk of infection.  Remember - BRUSH YOUR TEETH THE MORNING OF SURGERY WITH YOUR REGULAR TOOTHPASTE  Please do not use if you have an allergy to CHG or antibacterial soaps. If your skin becomes reddened/irritated stop using the CHG.  Do not shave (including legs and underarms) for at least 48 hours prior to first CHG shower. It is OK to shave your face.  Please follow these instructions carefully.   Shower the NIGHT BEFORE SURGERY and the MORNING OF SURGERY with DIAL Soap.   Pat yourself dry with a CLEAN TOWEL.  Wear CLEAN PAJAMAS to bed the night before surgery  Place CLEAN SHEETS on your bed the night of your first shower and  DO NOT SLEEP WITH PETS.   Day of Surgery: Please shower morning of surgery  Wear Clean/Comfortable clothing the morning of surgery Do not apply any deodorants/lotions.   Remember to brush your teeth WITH YOUR REGULAR TOOTHPASTE.   Questions were answered. Patient verbalized understanding of instructions.

## 2024-01-13 NOTE — Anesthesia Preprocedure Evaluation (Addendum)
 Anesthesia Evaluation  Patient identified by MRN, date of birth, ID band Patient awake    Reviewed: Allergy & Precautions, NPO status , Patient's Chart, lab work & pertinent test results  Airway Mallampati: II  TM Distance: >3 FB Neck ROM: Full    Dental  (+) Edentulous Upper, Edentulous Lower   Pulmonary asthma , sleep apnea , former smoker   Pulmonary exam normal        Cardiovascular hypertension, Pt. on home beta blockers pulmonary hypertension+ angina  + CAD, + Past MI, + CABG, + Peripheral Vascular Disease and +CHF  Normal cardiovascular exam+ Valvular Problems/Murmurs AS    Echo 09/11/23 ([facility]): SUMMARY  The left ventricle is moderately dilated.  Left ventricular systolic function is moderate to severely reduced.  LV ejection fraction = 25-30%. There is moderate to severe global hypokinesis of the left ventricle.  Abnormal paradoxical septal motion consistent with conduction delay.  No LV thrombus seen with contrast  The right ventricle is normal in size and function.  The left atrium is moderately dilated.  There is moderate aortic stenosis. Ao mean PG 23.7 mmHg. Ao max PG 48.9 mmHg. AVA VTI 1.2 cm2. There is mild aortic regurgitation.  There is mild to moderate mitral regurgitation.  The mitral regurgitant jet is eccentrically directed.  There is mild to moderate tricuspid regurgitation.  Severe pulmonary hypertension.  Moderately dilated, non-collapsible IVC consistent with an elevated right atrial pressure.  There is no pericardial effusion.  There is no comparison study available.  - Comparison 07/02/23 [facility]: LVEF 30-35%, inferior and inferolateral akinesis, grad II DD, mild MR, mild AR, low-flow low gradient severe AS with estimated AVA of < 1 cm2, Peak AV transvalvular velocity3.1 m/s, mean AVgradient: 20 mmHg, AVT VTI 0.8 cm2, Doppler velocity index 0.21, mild TR, severe pulmonary HTN, estimated PASP 68  mmHg   RHC/LHC 08/01/23:    Origin to Prox Graft lesion is 100% stenosed.   Origin to Prox Graft lesion is 100% stenosed.   Origin to Prox Graft lesion is 100% stenosed.   Ost LAD to Prox LAD lesion is 100% stenosed.   Ost RCA to Prox RCA lesion is 100% stenosed.   Mid Cx lesion is 99% stenosed.  1.  Severe native vessel disease. 2.  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  These presumably go to the diagonal, PDA, and obtuse marginal systems. 3.  Widely patent LIMA to LAD. 4.  Fick cardiac output of 6.9 L/min and Fick cardiac index of 3.0 L/min/m with the following hemodynamics:            Right atrial pressure mean of 16 mmHg            Right ventricular pressure 74/-1 with an end-diastolic pressure of 18 mmHg            PA pressure of 66/21 with a mean of 39 mmHg            Wedge pressure mean of 28 mmHg with V waves to 33 mmHg            PVR 1.6 Wood units            PA pulsatility index of 2.5 5.  Total dye expenditure of 40 cc  Recommendation: Continue evaluation for potential aortic valve intervention.    Neuro/Psych  Neuromuscular disease  negative psych ROS   GI/Hepatic negative GI ROS, Neg liver ROS,,,  Endo/Other  diabetes, Insulin  DependentHypothyroidism  Renal/GU ESRF and DialysisRenal diseaseHD T, R, Sat     Musculoskeletal  (+) Arthritis ,  Ambulates with a cane   Abdominal   Peds  Hematology  (+) Blood dyscrasia (Plavix )   Anesthesia Other Findings Gangrene of toe  Reproductive/Obstetrics                              Anesthesia Physical Anesthesia Plan  ASA: 4  Anesthesia Plan: Regional   Post-op Pain Management:    Induction:   PONV Risk Score and Plan: 1 and Ondansetron , Dexamethasone  and Treatment may vary due to age or medical condition  Airway Management Planned: Simple Face Mask  Additional Equipment:   Intra-op Plan:   Post-operative Plan:   Informed Consent: I have reviewed the  patients History and Physical, chart, labs and discussed the procedure including the risks, benefits and alternatives for the proposed anesthesia with the patient or authorized representative who has indicated his/her understanding and acceptance.       Plan Discussed with: CRNA  Anesthesia Plan Comments: (PAT note written 01/13/2024 by Allison Zelenak, PA-C. [place] surgery prior to undergoing TAVR.   )         Anesthesia Quick Evaluation

## 2024-01-13 NOTE — Progress Notes (Addendum)
 PCP - [name] LABOR., MD  Cardiologist - Revankar, Jaloni Sorber SAUNDERS, MD   PPM/ICD - denies Device Orders - n/a Rep Notified - n/a  Chest x-ray - 11-11-23 EKG - 11-11-23 Stress Test -  ECHO - 08-01-23 Cardiac Cath - 08-01-23  CPAP - denies  GLP-1 -denies  Fasting Blood Sugar - Per patient blood sugar ranges 115-120 in am Checks Blood Sugar BID/ Last A1c 5.8 on 11-13-23   Blood Thinner Instructions: continue Aspirin  Instructions: continue  ERAS Protcol - NPO  COVID TEST- n/a  Anesthesia review: yes, Hx of Dm, MI, PVD, OSA  Patient verbally denies any shortness of breath, fever, cough and chest pain during phone call   -------------  SDW INSTRUCTIONS given:  Your procedure is scheduled on January 14 2024.  Report to [facility] Main Entrance A at 11:00 A.M., and check in at the Admitting office.  Call this number if you have problems the morning of surgery:  [phone]   Remember:  Do not eat or drink after midnight the night before your surgery      Take these medicines the morning of surgery with A SIP OF WATER  acetaminophen  (TYLENOL )  aspirin   atorvastatin  (LIPITOR)  carvedilol  (COREG )  clopidogrel  (PLAVIX )  gabapentin  (NEURONTIN )  isosorbide  mononitrate (IMDUR )  levothyroxine  (SYNTHROID )  nitroGLYCERIN  (NITROSTAT )  nitroGLYCERIN  (NITROSTAT )  traMADol  (ULTRAM )   As of today, STOP taking any Aspirin  (unless otherwise instructed by your surgeon) Aleve, Naproxen, Ibuprofen, Motrin, Advil, Goody's, BC's, all herbal medications, fish oil, and all vitamins.  WHAT DO I DO ABOUT MY DIABETES MEDICATION?   Do not take oral diabetes medicines (pills) the morning of surgery.  THE NIGHT BEFORE SURGERY, take 3 units of LANTUS  insulin  if needed. (Lantus  prn at night)      The day of surgery, do not take other diabetes injectables, including Byetta (exenatide), Bydureon (exenatide ER), Victoza (liraglutide), or Trulicity (dulaglutide).  If your CBG is greater than 220  mg/dL, you may take  of your sliding scale (correction) dose of insulin .   HOW TO MANAGE YOUR DIABETES BEFORE AND AFTER SURGERY  Why is it important to control my blood sugar before and after surgery? Improving blood sugar levels before and after surgery helps healing and can limit problems. A way of improving blood sugar control is eating a healthy diet by:  Eating less sugar and carbohydrates  Increasing activity/exercise  Talking with your doctor about reaching your blood sugar goals High blood sugars (greater than 180 mg/dL) can raise your risk of infections and slow your recovery, so you will need to focus on controlling your diabetes during the weeks before surgery. Make sure that the doctor who takes care of your diabetes knows about your planned surgery including the date and location.  How do I manage my blood sugar before surgery? Check your blood sugar at least 4 times a day, starting 2 days before surgery, to make sure that the level is not too high or low.  Check your blood sugar the morning of your surgery when you wake up and every 2 hours until you get to the Short Stay unit.  If your blood sugar is less than 70 mg/dL, you will need to treat for low blood sugar: Do not take insulin . Treat a low blood sugar (less than 70 mg/dL) with  cup of clear juice (cranberry or apple), 4 glucose tablets, OR glucose gel. Recheck blood sugar in 15 minutes after treatment (to make sure it is greater than 70  mg/dL). If your blood sugar is not greater than 70 mg/dL on recheck, call [phone] for further instructions. Report your blood sugar to the short stay nurse when you get to Short Stay.  If you are admitted to the hospital after surgery: Your blood sugar will be checked by the staff and you will probably be given insulin  after surgery (instead of oral diabetes medicines) to make sure you have good blood sugar levels. The goal for blood sugar control after surgery is 80-180  mg/dL.                      Do not wear jewelry, make up, or nail polish            Do not wear lotions, powders, perfumes/colognes, or deodorant.            Do not shave 48 hours prior to surgery.  Men may shave face and neck.            Do not bring valuables to the hospital.            [facility] is not responsible for any belongings or valuables.  Do NOT Smoke (Tobacco/Vaping) 24 hours prior to your procedure If you use a CPAP at night, you may bring all equipment for your overnight stay.   Contacts, glasses, dentures or bridgework may not be worn into surgery.      For patients admitted to the hospital, discharge time will be determined by your treatment team.   Patients discharged the day of surgery will not be allowed to drive home, and someone needs to stay with them for 24 hours.    Special instructions:   [place]- Preparing For Surgery  Before surgery, you can play an important role. Because skin is not sterile, your skin needs to be as free of germs as possible. You can reduce the number of germs on your skin by washing with CHG (chlorahexidine gluconate) Soap before surgery.  CHG is an antiseptic cleaner which kills germs and bonds with the skin to continue killing germs even after washing.    Oral Hygiene is also important to reduce your risk of infection.  Remember - BRUSH YOUR TEETH THE MORNING OF SURGERY WITH YOUR REGULAR TOOTHPASTE  Please do not use if you have an allergy to CHG or antibacterial soaps. If your skin becomes reddened/irritated stop using the CHG.  Do not shave (including legs and underarms) for at least 48 hours prior to first CHG shower. It is OK to shave your face.  Please follow these instructions carefully.   Shower the NIGHT BEFORE SURGERY and the MORNING OF SURGERY with DIAL Soap.   Pat yourself dry with a CLEAN TOWEL.  Wear CLEAN PAJAMAS to bed the night before surgery  Place CLEAN SHEETS on your bed the night of your first shower and  DO NOT SLEEP WITH PETS.   Day of Surgery: Please shower morning of surgery  Wear Clean/Comfortable clothing the morning of surgery Do not apply any deodorants/lotions.   Remember to brush your teeth WITH YOUR REGULAR TOOTHPASTE.   Questions were answered. Patient verbalized understanding of instructions.

## 2024-01-13 NOTE — Progress Notes (Signed)
 Anesthesia Chart Review: SAME DAY WORK-UP  Case: [id number] Date/Time: 01/14/24 1318   Procedure: AMPUTATION, FOOT, PARTIAL (Left: Foot)   Anesthesia type: Regional   Diagnosis: Gangrene (HCC) [I96]   Pre-op  diagnosis: Gangrene of toe   [facility] 12 / [facility]   Surgeons: Serene Gaile ORN, MD       DISCUSSION: Patient is a 68 year old male scheduled for the above procedure. He has an extensive cardiac history and was scheduled for TAVR for severe AS, but this was delayed due to left foot gangrene. Following TAVR he may be considered for future cholecystectomy which is currently being managed by a cholecystotomy drain.  History includes former smoker (quit 06/03/01), HTN, HLD, CAD (CABG 2003, type 2 NSTEMI 09/27/20; LIMA-LAD patent with occluded vein grafts 08/01/23), chronic combined CHF, murmur (AS/AR, MR, TR 09/2023), pulmonary hypertension (moderate 66/21 with mean 39 08/01/23), bradycardia, DM2 (with neuropathy), hypothyroidism, PVD (right TP trunk, peroneal, popliteal artery angioplasties 08/30/13 & on left  03/02/18 , 09/30/22; right great toe amputation 03/13/18; left TP trunk stent 12/26/23), right carotid bruits, CKD (left brachiocephalic AVF creation 08/20/23, HT initiated 08/2023, TTS), anemia, thrombocytopenia, asthma, GERD, OSA, appendectomy (09/08/18), cholecystitis (s/p cholecystostomy tube 11/12/23; consider cholecystectomy vs drain capping following recovery from TAVR).  Last cardiology follow-up was on 12/15/23 with Sebastian Collar, PA-C [facility]. TAVR postponed due to necrotic left toe. He was referred to his primary vascular surgeon Dr. Marea, but ultimately surgery recommended at [facility] OR due to his multiple co-morbidities. Preoperative cardiology added to 12/15/23 note, on 01/01/24:  Mr. Nephew's perioperative risk of a major cardiac event is 11% according to the Revised Cardiac Risk Index (RCRI).  Therefore, he is at high risk for perioperative complications.   His  functional capacity is fair at 4.61 METs according to the [employer] Activity Status Index (DASI). Recommendations: He is at high risk for surgery due to comorbidities but has decent functional ability and needs toe amputation prior to valve replacement and is at acceptable risk to proceed.  Antiplatelet and/or Anticoagulation Recommendations: Pt currently on DAPT with aspirin  and plavix  after recent stent placement to the left tibioperoneal trunk. If these need to be held, will need to clear with Dr. Marea.   Anesthesia team to evaluate on the day of surgery.  Reported directions to continue aspirin  and Plavix  perioperatively.   VS: Ht 6' (1.829 m)   Wt 82.2 kg   BMI 24.58 kg/m  BP Readings from Last 3 Encounters:  01/09/24 (!) 96/59  12/26/23 129/65  12/15/23 (!) 108/52   Pulse Readings from Last 3 Encounters:  01/09/24 76  12/26/23 76  12/15/23 70     PROVIDERS: [name] LABOR., MD is PCP Edwyna Backers, MD is primary cardiologist Wendel Haws, MD is structural heart cardiologist Dasie Best, MD is general surgeon Marea Mayo, MD is primary vascular surgeon   LABS: For day of surgery. Last results in [facility] include: Lab Results  Component Value Date   WBC 6.8 11/14/2023   HGB 11.4 (L) 11/14/2023   HCT 35.1 (L) 11/14/2023   PLT 95 (L) 11/14/2023   GLUCOSE 112 (H) 11/14/2023   ALT 97 (H) 11/13/2023   AST 187 (H) 11/13/2023   NA 135 11/14/2023   K 4.1 11/14/2023   CL 95 (L) 11/14/2023   CREATININE 5.48 (H) 11/14/2023   BUN 35 (H) 11/14/2023   CO2 25 11/14/2023   TSH 7.948 (H) 08/11/2023   INR 1.3 (H) 11/12/2023  HGBA1C 5.8 (H) 11/13/2023     IMAGES: 1V PCXR 11/11/23: FINDINGS: Cardiac shadow is enlarged but stable. Postsurgical changes are again seen. Right jugular dialysis catheter is noted. The lungs are well aerated. Elevation of the right hemidiaphragm is seen. No focal infiltrate or effusion is noted. No bony abnormality is seen. IMPRESSION: No active  disease.   CTA chest/abd/pelvis (pre-TAVR) 10/10/23: MPRESSION: - Focal nonflow limiting dissection of the right common iliac artery. - Otherwise this is a preprocedural CTA with various additional findings as above.    EKG: 11/11/23:  Sinus tachycardia at 104 bpm Probable left ventricular hypertrophy Borderline T abnormalities, inferior leads Baseline wander in lead(s) V1 Confirmed by Theadore Sharper [id number]) on 11/12/2023 12:53:11 PM   CV: CT Cardiac (pre-TAVR) 10/10/23: IMPRESSION: 1. Calcified tri leaflet AV with annulus 591 mm2 suitable for a 29 mm Sapien valve. Alternatively a 34 mm Medtronic valve can be considered 2.  Coronary arteries sufficient height above annulus for deployment 3.  Occluded SVG;s.  Patient LIMA to LAD 4.  Membranous septal length 8.1 mm 5.  Optimum angiographic angle for deployment RAO 3 Caudal 3 degrees 6.  AV calcium  score 2670   Echo 09/11/23 ([facility]): SUMMARY  The left ventricle is moderately dilated.  Left ventricular systolic function is moderate to severely reduced.  LV ejection fraction = 25-30%. There is moderate to severe global hypokinesis of the left ventricle.  Abnormal  paradoxical  septal motion consistent with conduction delay.  No LV thrombus seen with contrast  The right ventricle is normal in size and function.  The left atrium is moderately dilated.  There is moderate aortic stenosis. Ao mean PG 23.7 mmHg. Ao max PG 48.9 mmHg. AVA VTI 1.2 cm2. There is mild aortic regurgitation.  There is mild to moderate mitral regurgitation.  The mitral regurgitant jet is eccentrically directed.  There is mild to moderate tricuspid regurgitation.  Severe pulmonary hypertension.  Moderately dilated, non-collapsible IVC consistent with an elevated right atrial pressure.  There is no pericardial effusion.  There is no comparison study available.  - Comparison 07/02/23 TTE [facility]: LVEF 30-35%, inferior and inferolateral akinesis, grad II DD, mild MR,  mild AR, low-flow low gradient severe AS with estimated AVA of < 1 cm2, Peak AV transvalvular velocity 3.1 m/s, mean AVgradient: 20 mmHg, AVT VTI 0.8 cm2, Doppler velocity index 0.21, mild TR, severe pulmonary HTN, estimated PASP 68 mmHg   RHC/LHC 08/01/23:    Origin to Prox Graft lesion is 100% stenosed.   Origin to Prox Graft lesion is 100% stenosed.   Origin to Prox Graft lesion is 100% stenosed.   Ost LAD to Prox LAD lesion is 100% stenosed.   Ost RCA to Prox RCA lesion is 100% stenosed.   Mid Cx lesion is 99% stenosed.   1.  Severe native vessel disease. 2.  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  These presumably go to the diagonal, PDA, and obtuse marginal systems. 3.  Widely patent LIMA to LAD. 4.  Fick cardiac output of 6.9 L/min and Fick cardiac index of 3.0 L/min/m with the following hemodynamics:            Right atrial pressure mean of 16 mmHg            Right ventricular pressure 74/-1 with an end-diastolic pressure of 18 mmHg            PA pressure of 66/21 with a mean of 39 mmHg  Wedge pressure mean of 28 mmHg with V waves to 33 mmHg            PVR 1.6 Wood units            PA pulsatility index of 2.5 5.  Total dye expenditure of 40 cc   Recommendation: Continue evaluation for potential aortic valve intervention.   Past Medical History:  Diagnosis Date   Abdominopelvic abscess (HCC) 09/29/2018   Abscess of appendix 09/22/2018   Acquired spondylolisthesis of lumbosacral region 03/20/2020   Acute cholecystitis 11/15/2023   Acute kidney injury (HCC) 10/07/2018   Acute kidney injury superimposed on chronic kidney disease (HCC) 11/06/2020   Acute kidney injury superimposed on CKD (HCC) 02/18/2020   Acute on chronic systolic (congestive) heart failure (HCC) 07/26/2015   Formatting of this note might be different from the original. Formatting of this note might be different from the original. Formatting of this note might be different from the  original. revenkar EF 35% Formatting of this note might be different from the original. revenkar EF 35%     Acute on chronic systolic CHF (congestive heart failure) (HCC) 08/12/2023   Anasarca 07/01/2023   Anemia of chronic disease 08/18/2019   Anemia, chronic disease 08/18/2019   Aortic atherosclerosis (HCC)    Aortic stenosis    a.) TTE 09/15/2018: mild-mod (MPG 19); b.) TTE 03/21/2021: mild-mod (MPG 14.3); c.) TTE 04/24/2022: mild- mod (MPG 15)   Aortic stenosis, moderate 10/15/2019   Appendicitis 09/08/2018   ARF (acute renal failure) (HCC) 02/19/2020   Asthma    Atherosclerosis of native arteries of the extremities with ulceration (HCC) 02/24/2018   Formatting of this note might be different from the original.  Last Assessment & Plan:   His ABIs today are 1.07 on the right and 0.99 on the left with biphasic waveforms.  Although these pressures may be somewhat elevated from medial calcification, his flow does appear to be reasonably good.  His left ABI was 0.58 prior to intervention.  At this point, we will stretch out his follow-up and see hi   Benign hypertension with CKD (chronic kidney disease) stage IV (HCC) 02/24/2018   Benign prostatic hyperplasia without lower urinary tract symptoms 01/14/2018   Bilateral lower extremity edema 11/04/2018   Bradycardia 03/25/2018   Bruit of right carotid artery 07/26/2015   CAD (coronary artery disease) 2003   a.) s/p 4v CABG 2003   Cardiac murmur 07/26/2015   Choledocholithiasis 11/12/2023   Chronic combined systolic and diastolic CHF (congestive heart failure) (HCC) 07/26/2015   a.) TTE 09/15/2018: EF 50-55%, mod LVH, RVE, BAE, mild-mod TR, AoV sclerosis, G1DD; b.) MPI 10/27/2019: EF <30%; c.) TTE 03/21/2021: EF 35-40%, post AK, inf HK, mod LVH, mod red RVSF, mod LAE, mod Aov sclerosis, G2DD; c.) TTE 04/24/2022: EF 35-40%, post AK, glok HK, mod LVE, mod red RVSF, mild-mod MR, Aov sclerosis, G3DD   Chronic idiopathic constipation 11/02/2018    Chronic low back pain without sciatica 12/02/2018   Chronic pain of right hip 03/31/2020   Chronic systolic heart failure (HCC) 07/26/2015   Formatting of this note might be different from the original. revenkar EF 35%     CKD (chronic kidney disease) stage 4, GFR 15-29 ml/min (HCC) 10/07/2018   Congestive heart failure (HCC) 07/26/2015   Last Assessment & Plan:   Poor cardiac function could certainly be contributing to poor blood supply to the feet and toes as well.     Formatting of this note might  be different from the original.  Formatting of this note might be different from the original.  Last Assessment & Plan:   Poor cardiac function could certainly be contributing to poor blood supply to the feet and toes as well.     Last   Coronary artery disease    Diabetes mellitus without complication (HCC)    Dysphagia 07/26/2015   Encounter for long-term (current) use of insulin  (HCC) 09/27/2020   Erectile dysfunction 07/14/2017   ESRD on dialysis [facility]) 11/15/2023   Essential hypertension 02/24/2018   Fever 11/07/2020   Foot ulcer (HCC) 09/27/2020   left foot  Formatting of this note might be different from the original.  Formatting of this note might be different from the original.  left foot     Foot ulcer, left (HCC)    GERD (gastroesophageal reflux disease)    Heart murmur 07/26/2015   Hematuria 07/27/2015   High risk medication use 07/26/2015   History of marijuana use    Hyperkalemia 05/29/2020   Hyperlipidemia 07/26/2015   Hyperlipidemia associated with type 2 diabetes mellitus (HCC) 02/18/2020   Hypertension    Hypertension associated with diabetes (HCC)    Hypoglycemia 05/07/2019   Hypothyroidism 11/14/2015   Hypothyroidism (acquired) 11/14/2015   Insomnia 11/02/2018   Kidney insufficiency    Lumbar spondylosis 03/20/2020   Malaise and fatigue 03/25/2018   Malnutrition of moderate degree 08/20/2023   Mitral regurgitation 05/12/2019   Moderate aortic regurgitation  05/12/2019   Myocardial infarction due to demand ischemia (HCC) 09/26/2020   a.) Type II NSTEMI; b.) troponins were trended 0.54 --> 0.56 --> 0.52 ng/mL   Non-compliance 05/05/2019   NSTEMI (non-ST elevated myocardial infarction) (HCC) 09/27/2020   Osseous and subluxation stenosis of intervertebral foramina of lumbar region 03/20/2020   Osteoarthritis 10/06/2019   Pancytopenia (HCC) 08/12/2023   Peripheral vascular disease (HCC)    Personal history of tobacco use, presenting hazards to health 09/27/2020   Polyneuropathy in diabetes (HCC) 05/07/2019   Primary osteoarthritis involving multiple joints 10/06/2019   Primary osteoarthritis of right hip 02/21/2020   Pulmonary HTN (HCC)    a.) TTE 05/12/2019: PASP 71; b.) TTE 09/27/2020: PASP >70; c.) TTE 03/21/2021: RVSP 43; d.) TTE 04/24/2022: RVSP 80.6   Puncture wound of right hip 04/02/2018   PVD (peripheral vascular disease) with claudication (HCC)    a.) s/p PTA 03/02/2018 - balloon angioplasty LEFT below knee popliteal artery; b.) s/p PTA 09/30/2022: baloon angioplasty LEFT tibioperoneal trunck, most proximal peroneal artery, and LEFT popliteal artery.   Regional wall motion abnormality of heart 05/07/2019   Right groin pain 02/18/2020   S/P CABG x 4 2003   Screening for colon cancer 02/18/2018   Sepsis (HCC) 11/15/2023   Sleep apnea 07/26/2015   a.) does not require nocturnal PAP therapy   Subacute osteomyelitis of left foot (HCC) 03/25/2018   Thrombocytopenia (HCC)    Type 2 diabetes mellitus with hyperlipidemia (HCC) 07/26/2015   Type 2 diabetes mellitus with stage 4 chronic kidney disease, with long-term current use of insulin  (HCC) 07/26/2015   Type II diabetes mellitus with neurological manifestations (HCC) 02/18/2020   Vitamin B12 deficiency 06/16/2019   Vitamin D deficiency 05/07/2018    Past Surgical History:  Procedure Laterality Date   AMPUTATION TOE Left 03/13/2018   Procedure: AMPUTATION TOE-MPJ;  Surgeon: Ashley Soulier, DPM;  Location: [facility] ORS;  Service: Podiatry;  Laterality: Left;   ANGIOPLASTY     AV FISTULA PLACEMENT Left 08/20/2023   Procedure:  ARTERIOVENOUS (AV) FISTULA CREATION;  Surgeon: Gretta Lonni PARAS, MD;  Location: [facility] OR;  Service: Vascular;  Laterality: Left;   CARDIAC CATHETERIZATION     CORONARY ARTERY BYPASS GRAFT N/A 2003   IR CATHETER TUBE CHANGE  12/31/2023   IR FLUORO GUIDE CV LINE RIGHT  08/18/2023   IR PERC CHOLECYSTOSTOMY  11/12/2023   IR RADIOLOGIST EVAL & MGMT  12/31/2023   IR US  GUIDE VASC ACCESS RIGHT  08/18/2023   LAPAROSCOPIC APPENDECTOMY N/A 09/08/2018   Procedure: APPENDECTOMY LAPAROSCOPIC;  Surgeon: Jordis Laneta FALCON, MD;  Location: [facility] ORS;  Service: General;  Laterality: N/A;   LOWER EXTREMITY ANGIOGRAPHY Left 03/02/2018   Procedure: LOWER EXTREMITY ANGIOGRAPHY;  Surgeon: Marea Selinda RAMAN, MD;  Location: [facility] LAB;  Service: Cardiovascular;  Laterality: Left;   LOWER EXTREMITY ANGIOGRAPHY Left 09/30/2022   Procedure: Lower Extremity Angiography;  Surgeon: Marea Selinda RAMAN, MD;  Location: [facility];  Service: Cardiovascular;  Laterality: Left;   LOWER EXTREMITY ANGIOGRAPHY Left 12/26/2023   Procedure: Lower Extremity Angiography;  Surgeon: Marea Selinda RAMAN, MD;  Location: [facility];  Service: Cardiovascular;  Laterality: Left;   RIGHT/LEFT HEART CATH AND CORONARY/GRAFT ANGIOGRAPHY N/A 08/01/2023   Procedure: RIGHT/LEFT HEART CATH AND CORONARY/GRAFT ANGIOGRAPHY;  Surgeon: Wendel Lurena POUR, MD;  Location: [facility];  Service: Cardiovascular;  Laterality: N/A;   ROTATOR CUFF REPAIR Left     MEDICATIONS:  sodium chloride  flush (NS) 0.9 % injection 10 mL    acetaminophen  (TYLENOL ) 500 MG tablet   aspirin  EC 81 MG tablet   atorvastatin  (LIPITOR) 20 MG tablet   carvedilol  (COREG ) 3.125 MG tablet   clopidogrel  (PLAVIX ) 75 MG tablet   gabapentin  (NEURONTIN ) 300 MG capsule   insulin  glargine (LANTUS ) 100 UNIT/ML injection   isosorbide   mononitrate (IMDUR ) 120 MG 24 hr tablet   latanoprost  (XALATAN ) 0.005 % ophthalmic solution   levothyroxine  (SYNTHROID ) 50 MCG tablet   nitroGLYCERIN  (NITROSTAT ) 0.4 MG SL tablet   sevelamer  carbonate (RENVELA ) 800 MG tablet   tamsulosin  (FLOMAX ) 0.4 MG CAPS capsule   traMADol  (ULTRAM ) 50 MG tablet   Methoxy PEG-Epoetin Beta (MIRCERA IJ)   sodium chloride  flush (NS) 0.9 % SOLN   sodium chloride  flush 0.9 % SOLN injection    Isaiah Ruder, PA-C Surgical Short Stay/Anesthesiology [facility] Phone [phone] [facility] Phone [phone] 01/13/2024 2:26 PM]

## 2024-01-14 ENCOUNTER — Other Ambulatory Visit: Payer: Self-pay

## 2024-01-14 ENCOUNTER — Encounter (HOSPITAL_COMMUNITY)
Admission: RE | Disposition: A | Discharge status: Rehabilitation Facility | Payer: Self-pay | Source: Home / Self Care | Attending: Internal Medicine

## 2024-01-14 ENCOUNTER — Ambulatory Visit: Admitting: Cardiology

## 2024-01-14 ENCOUNTER — Inpatient Hospital Stay (HOSPITAL_COMMUNITY): Payer: Self-pay | Admitting: Physician Assistant

## 2024-01-14 ENCOUNTER — Inpatient Hospital Stay (HOSPITAL_COMMUNITY)
Admission: RE | Admit: 2024-01-14 | Discharge: 2024-02-05 | DRG: 266 | Disposition: A | Discharge status: Rehabilitation Facility | Attending: Hospitalist | Admitting: Hospitalist

## 2024-01-14 ENCOUNTER — Encounter (HOSPITAL_COMMUNITY): Payer: Self-pay | Admitting: Surgery

## 2024-01-14 DIAGNOSIS — I70245 Atherosclerosis of native arteries of left leg with ulceration of other part of foot: Secondary | ICD-10-CM | POA: Diagnosis present

## 2024-01-14 DIAGNOSIS — G546 Phantom limb syndrome with pain: Secondary | ICD-10-CM | POA: Diagnosis not present

## 2024-01-14 DIAGNOSIS — D638 Anemia in other chronic diseases classified elsewhere: Secondary | ICD-10-CM | POA: Diagnosis not present

## 2024-01-14 DIAGNOSIS — Z87891 Personal history of nicotine dependence: Secondary | ICD-10-CM

## 2024-01-14 DIAGNOSIS — I2510 Atherosclerotic heart disease of native coronary artery without angina pectoris: Secondary | ICD-10-CM | POA: Diagnosis not present

## 2024-01-14 DIAGNOSIS — Z794 Long term (current) use of insulin: Secondary | ICD-10-CM | POA: Diagnosis not present

## 2024-01-14 DIAGNOSIS — E039 Hypothyroidism, unspecified: Secondary | ICD-10-CM | POA: Diagnosis present

## 2024-01-14 DIAGNOSIS — Z833 Family history of diabetes mellitus: Secondary | ICD-10-CM

## 2024-01-14 DIAGNOSIS — Z825 Family history of asthma and other chronic lower respiratory diseases: Secondary | ICD-10-CM

## 2024-01-14 DIAGNOSIS — I255 Ischemic cardiomyopathy: Secondary | ICD-10-CM | POA: Diagnosis present

## 2024-01-14 DIAGNOSIS — G548 Other nerve root and plexus disorders: Secondary | ICD-10-CM | POA: Diagnosis not present

## 2024-01-14 DIAGNOSIS — E1169 Type 2 diabetes mellitus with other specified complication: Secondary | ICD-10-CM | POA: Diagnosis present

## 2024-01-14 DIAGNOSIS — Z79899 Other long term (current) drug therapy: Secondary | ICD-10-CM

## 2024-01-14 DIAGNOSIS — I5042 Chronic combined systolic (congestive) and diastolic (congestive) heart failure: Secondary | ICD-10-CM

## 2024-01-14 DIAGNOSIS — Z89422 Acquired absence of other left toe(s): Secondary | ICD-10-CM | POA: Diagnosis not present

## 2024-01-14 DIAGNOSIS — I5023 Acute on chronic systolic (congestive) heart failure: Secondary | ICD-10-CM | POA: Diagnosis not present

## 2024-01-14 DIAGNOSIS — G8929 Other chronic pain: Secondary | ICD-10-CM | POA: Diagnosis present

## 2024-01-14 DIAGNOSIS — Z006 Encounter for examination for normal comparison and control in clinical research program: Secondary | ICD-10-CM

## 2024-01-14 DIAGNOSIS — I472 Ventricular tachycardia, unspecified: Secondary | ICD-10-CM | POA: Diagnosis not present

## 2024-01-14 DIAGNOSIS — I10 Essential (primary) hypertension: Secondary | ICD-10-CM | POA: Diagnosis not present

## 2024-01-14 DIAGNOSIS — I953 Hypotension of hemodialysis: Secondary | ICD-10-CM | POA: Diagnosis not present

## 2024-01-14 DIAGNOSIS — Z8249 Family history of ischemic heart disease and other diseases of the circulatory system: Secondary | ICD-10-CM

## 2024-01-14 DIAGNOSIS — I5020 Unspecified systolic (congestive) heart failure: Secondary | ICD-10-CM | POA: Diagnosis not present

## 2024-01-14 DIAGNOSIS — R52 Pain, unspecified: Secondary | ICD-10-CM | POA: Diagnosis not present

## 2024-01-14 DIAGNOSIS — E119 Type 2 diabetes mellitus without complications: Secondary | ICD-10-CM | POA: Diagnosis not present

## 2024-01-14 DIAGNOSIS — G4700 Insomnia, unspecified: Secondary | ICD-10-CM | POA: Diagnosis present

## 2024-01-14 DIAGNOSIS — I272 Pulmonary hypertension, unspecified: Secondary | ICD-10-CM | POA: Diagnosis present

## 2024-01-14 DIAGNOSIS — I350 Nonrheumatic aortic (valve) stenosis: Secondary | ICD-10-CM | POA: Diagnosis present

## 2024-01-14 DIAGNOSIS — L97529 Non-pressure chronic ulcer of other part of left foot with unspecified severity: Secondary | ICD-10-CM | POA: Diagnosis present

## 2024-01-14 DIAGNOSIS — E872 Acidosis, unspecified: Secondary | ICD-10-CM | POA: Diagnosis present

## 2024-01-14 DIAGNOSIS — I5032 Chronic diastolic (congestive) heart failure: Secondary | ICD-10-CM | POA: Diagnosis not present

## 2024-01-14 DIAGNOSIS — D631 Anemia in chronic kidney disease: Secondary | ICD-10-CM | POA: Diagnosis present

## 2024-01-14 DIAGNOSIS — J45909 Unspecified asthma, uncomplicated: Secondary | ICD-10-CM | POA: Diagnosis present

## 2024-01-14 DIAGNOSIS — E1152 Type 2 diabetes mellitus with diabetic peripheral angiopathy with gangrene: Principal | ICD-10-CM | POA: Diagnosis present

## 2024-01-14 DIAGNOSIS — N2581 Secondary hyperparathyroidism of renal origin: Secondary | ICD-10-CM | POA: Diagnosis present

## 2024-01-14 DIAGNOSIS — E1122 Type 2 diabetes mellitus with diabetic chronic kidney disease: Secondary | ICD-10-CM | POA: Diagnosis present

## 2024-01-14 DIAGNOSIS — D649 Anemia, unspecified: Secondary | ICD-10-CM | POA: Diagnosis not present

## 2024-01-14 DIAGNOSIS — I739 Peripheral vascular disease, unspecified: Secondary | ICD-10-CM | POA: Diagnosis present

## 2024-01-14 DIAGNOSIS — I252 Old myocardial infarction: Secondary | ICD-10-CM

## 2024-01-14 DIAGNOSIS — E785 Hyperlipidemia, unspecified: Secondary | ICD-10-CM | POA: Diagnosis present

## 2024-01-14 DIAGNOSIS — Z83438 Family history of other disorder of lipoprotein metabolism and other lipidemia: Secondary | ICD-10-CM

## 2024-01-14 DIAGNOSIS — I152 Hypertension secondary to endocrine disorders: Secondary | ICD-10-CM | POA: Diagnosis present

## 2024-01-14 DIAGNOSIS — Z89432 Acquired absence of left foot: Secondary | ICD-10-CM | POA: Diagnosis not present

## 2024-01-14 DIAGNOSIS — Z952 Presence of prosthetic heart valve: Secondary | ICD-10-CM | POA: Diagnosis not present

## 2024-01-14 DIAGNOSIS — K810 Acute cholecystitis: Secondary | ICD-10-CM | POA: Diagnosis present

## 2024-01-14 DIAGNOSIS — Z9862 Peripheral vascular angioplasty status: Secondary | ICD-10-CM

## 2024-01-14 DIAGNOSIS — Z951 Presence of aortocoronary bypass graft: Secondary | ICD-10-CM

## 2024-01-14 DIAGNOSIS — Z7982 Long term (current) use of aspirin: Secondary | ICD-10-CM

## 2024-01-14 DIAGNOSIS — N186 End stage renal disease: Secondary | ICD-10-CM | POA: Diagnosis present

## 2024-01-14 DIAGNOSIS — E1142 Type 2 diabetes mellitus with diabetic polyneuropathy: Secondary | ICD-10-CM | POA: Diagnosis present

## 2024-01-14 DIAGNOSIS — I132 Hypertensive heart and chronic kidney disease with heart failure and with stage 5 chronic kidney disease, or end stage renal disease: Secondary | ICD-10-CM | POA: Diagnosis not present

## 2024-01-14 DIAGNOSIS — Z89512 Acquired absence of left leg below knee: Secondary | ICD-10-CM | POA: Diagnosis not present

## 2024-01-14 DIAGNOSIS — Z79890 Hormone replacement therapy (postmenopausal): Secondary | ICD-10-CM

## 2024-01-14 DIAGNOSIS — K5901 Slow transit constipation: Secondary | ICD-10-CM | POA: Diagnosis not present

## 2024-01-14 DIAGNOSIS — I7025 Atherosclerosis of native arteries of other extremities with ulceration: Secondary | ICD-10-CM | POA: Diagnosis present

## 2024-01-14 DIAGNOSIS — E11621 Type 2 diabetes mellitus with foot ulcer: Secondary | ICD-10-CM | POA: Diagnosis present

## 2024-01-14 DIAGNOSIS — I110 Hypertensive heart disease with heart failure: Secondary | ICD-10-CM | POA: Diagnosis not present

## 2024-01-14 DIAGNOSIS — I96 Gangrene, not elsewhere classified: Principal | ICD-10-CM | POA: Diagnosis present

## 2024-01-14 DIAGNOSIS — S91132A Puncture wound without foreign body of left great toe without damage to nail, initial encounter: Secondary | ICD-10-CM | POA: Diagnosis not present

## 2024-01-14 DIAGNOSIS — Z992 Dependence on renal dialysis: Secondary | ICD-10-CM

## 2024-01-14 DIAGNOSIS — K219 Gastro-esophageal reflux disease without esophagitis: Secondary | ICD-10-CM | POA: Diagnosis present

## 2024-01-14 DIAGNOSIS — Z4781 Encounter for orthopedic aftercare following surgical amputation: Secondary | ICD-10-CM | POA: Diagnosis not present

## 2024-01-14 DIAGNOSIS — E875 Hyperkalemia: Secondary | ICD-10-CM | POA: Diagnosis not present

## 2024-01-14 DIAGNOSIS — N400 Enlarged prostate without lower urinary tract symptoms: Secondary | ICD-10-CM | POA: Diagnosis present

## 2024-01-14 DIAGNOSIS — T8789 Other complications of amputation stump: Secondary | ICD-10-CM | POA: Diagnosis not present

## 2024-01-14 DIAGNOSIS — G4730 Sleep apnea, unspecified: Secondary | ICD-10-CM | POA: Diagnosis present

## 2024-01-14 DIAGNOSIS — I447 Left bundle-branch block, unspecified: Secondary | ICD-10-CM | POA: Diagnosis not present

## 2024-01-14 DIAGNOSIS — M545 Low back pain, unspecified: Secondary | ICD-10-CM | POA: Diagnosis present

## 2024-01-14 DIAGNOSIS — Z7902 Long term (current) use of antithrombotics/antiplatelets: Secondary | ICD-10-CM

## 2024-01-14 HISTORY — DX: Gangrene, not elsewhere classified: I96

## 2024-01-14 HISTORY — PX: AMPUTATION: SHX166

## 2024-01-14 LAB — CREATININE, SERUM
Creatinine, Ser: 6.37 mg/dL — ABNORMAL HIGH (ref 0.61–1.24)
GFR, Estimated: 9 mL/min — ABNORMAL LOW (ref >60)

## 2024-01-14 LAB — CBC
HCT: 32 % — ABNORMAL LOW (ref 39.0–52.0)
Hemoglobin: 10.1 g/dL — ABNORMAL LOW (ref 13.0–17.0)
MCH: 30.6 pg (ref 26.0–34.0)
MCHC: 31.6 g/dL (ref 30.0–36.0)
MCV: 97 fL (ref 80.0–100.0)
Platelets: 192 K/uL (ref 150–400)
RBC: 3.3 MIL/uL — ABNORMAL LOW (ref 4.22–5.81)
RDW: 15 % (ref 11.5–15.5)
WBC: 10.7 K/uL — ABNORMAL HIGH (ref 4.0–10.5)
nRBC: 0 % (ref 0.0–0.2)

## 2024-01-14 LAB — POCT I-STAT, CHEM 8
BUN: 29 mg/dL — ABNORMAL HIGH (ref 8–23)
Calcium, Ion: 0.97 mmol/L — ABNORMAL LOW (ref 1.15–1.40)
Chloride: 96 mmol/L — ABNORMAL LOW (ref 98–111)
Creatinine, Ser: 6.1 mg/dL — ABNORMAL HIGH (ref 0.61–1.24)
Glucose, Bld: 112 mg/dL — ABNORMAL HIGH (ref 70–99)
HCT: 34 % — ABNORMAL LOW (ref 39.0–52.0)
Hemoglobin: 11.6 g/dL — ABNORMAL LOW (ref 13.0–17.0)
Potassium: 4 mmol/L (ref 3.5–5.1)
Sodium: 137 mmol/L (ref 135–145)
TCO2: 29 mmol/L (ref 22–32)

## 2024-01-14 LAB — GLUCOSE, CAPILLARY
Glucose-Capillary: 105 mg/dL — ABNORMAL HIGH (ref 70–99)
Glucose-Capillary: 100 mg/dL — ABNORMAL HIGH (ref 70–99)
Glucose-Capillary: 98 mg/dL (ref 70–99)
Glucose-Capillary: 165 mg/dL — ABNORMAL HIGH (ref 70–99)

## 2024-01-14 SURGERY — AMPUTATION, FOOT, PARTIAL
Anesthesia: Regional | Site: Foot | Laterality: Left

## 2024-01-14 MED ORDER — FENTANYL CITRATE (PF) 100 MCG/2ML IJ SOLN: 50.0000 ug | Freq: Once | INTRAMUSCULAR | Status: AC

## 2024-01-14 MED ORDER — INSULIN ASPART 100 UNIT/ML IJ SOLN
0.0000 [IU] | Freq: Three times a day (TID) | INTRAMUSCULAR | Status: DC
  Administered 2024-01-21 – 2024-01-22 (×2): 1 [IU] via SUBCUTANEOUS

## 2024-01-14 MED ORDER — CHLORHEXIDINE GLUCONATE 4 % EX SOLN: 60.0000 mL | Freq: Once | CUTANEOUS | Status: DC

## 2024-01-14 MED ORDER — ISOSORBIDE MONONITRATE ER 60 MG PO TB24
120.0000 mg | ORAL_TABLET | Freq: Every day | ORAL | Status: DC
  Administered 2024-01-15 – 2024-02-05 (×14): 120 mg via ORAL
  Filled 2024-01-14 (×20): qty 2

## 2024-01-14 MED ORDER — ALBUTEROL SULFATE (2.5 MG/3ML) 0.083% IN NEBU: 2.5000 mg | INHALATION_SOLUTION | RESPIRATORY_TRACT | Status: DC | PRN

## 2024-01-14 MED ORDER — ACETAMINOPHEN 10 MG/ML IV SOLN: 1000.0000 mg | Freq: Once | INTRAVENOUS | Status: DC | PRN

## 2024-01-14 MED ORDER — 0.9 % SODIUM CHLORIDE (POUR BTL) OPTIME
TOPICAL | Status: DC | PRN
  Administered 2024-01-14 (×2): 1000 mL

## 2024-01-14 MED ORDER — CLOPIDOGREL BISULFATE 75 MG PO TABS
75.0000 mg | ORAL_TABLET | Freq: Every day | ORAL | Status: DC
  Administered 2024-01-15 – 2024-01-16 (×2): 75 mg via ORAL
  Filled 2024-01-14 (×3): qty 1

## 2024-01-14 MED ORDER — ATORVASTATIN CALCIUM 10 MG PO TABS
20.0000 mg | ORAL_TABLET | Freq: Every day | ORAL | Status: DC
  Administered 2024-01-15 – 2024-02-05 (×21): 20 mg via ORAL
  Filled 2024-01-14 (×21): qty 2

## 2024-01-14 MED ORDER — CARVEDILOL 3.125 MG PO TABS
3.1250 mg | ORAL_TABLET | Freq: Two times a day (BID) | ORAL | Status: DC
  Administered 2024-01-14 – 2024-01-18 (×8): 3.125 mg via ORAL
  Filled 2024-01-14 (×8): qty 1

## 2024-01-14 MED ORDER — FENTANYL CITRATE (PF) 250 MCG/5ML IJ SOLN
INTRAMUSCULAR | Status: AC
  Filled 2024-01-14: qty 5

## 2024-01-14 MED ORDER — TAMSULOSIN HCL 0.4 MG PO CAPS
0.4000 mg | ORAL_CAPSULE | Freq: Every day | ORAL | Status: DC
  Administered 2024-01-15 – 2024-02-05 (×21): 0.4 mg via ORAL
  Filled 2024-01-14 (×21): qty 1

## 2024-01-14 MED ORDER — ONDANSETRON HCL 4 MG/2ML IJ SOLN: 4.0000 mg | Freq: Once | INTRAMUSCULAR | Status: DC | PRN

## 2024-01-14 MED ORDER — CHLORHEXIDINE GLUCONATE 0.12 % MT SOLN
15.0000 mL | Freq: Once | OROMUCOSAL | Status: AC
  Administered 2024-01-14 (×2): 15 mL via OROMUCOSAL
  Filled 2024-01-14: qty 15

## 2024-01-14 MED ORDER — INSULIN ASPART 100 UNIT/ML IJ SOLN: 0.0000 [IU] | INTRAMUSCULAR | Status: DC | PRN

## 2024-01-14 MED ORDER — NITROGLYCERIN 0.4 MG SL SUBL: 0.4000 mg | SUBLINGUAL_TABLET | SUBLINGUAL | Status: DC | PRN

## 2024-01-14 MED ORDER — SODIUM CHLORIDE 0.9 % IV SOLN: INTRAVENOUS | Status: DC

## 2024-01-14 MED ORDER — ACETAMINOPHEN 325 MG PO TABS
650.0000 mg | ORAL_TABLET | Freq: Four times a day (QID) | ORAL | Status: DC | PRN
  Administered 2024-01-14 – 2024-02-01 (×11): 650 mg via ORAL
  Filled 2024-01-14 (×10): qty 2

## 2024-01-14 MED ORDER — FENTANYL CITRATE (PF) 100 MCG/2ML IJ SOLN: 25.0000 ug | INTRAMUSCULAR | Status: DC | PRN

## 2024-01-14 MED ORDER — OXYCODONE HCL 5 MG PO TABS
5.0000 mg | ORAL_TABLET | ORAL | Status: DC | PRN
  Administered 2024-01-16 – 2024-01-30 (×38): 5 mg via ORAL
  Filled 2024-01-14 (×37): qty 1

## 2024-01-14 MED ORDER — MIDAZOLAM HCL 2 MG/2ML IJ SOLN
INTRAMUSCULAR | Status: AC
  Filled 2024-01-14: qty 2

## 2024-01-14 MED ORDER — PROPOFOL 500 MG/50ML IV EMUL
INTRAVENOUS | Status: DC | PRN
  Administered 2024-01-14 (×2): 30 ug/kg/min via INTRAVENOUS
  Administered 2024-01-14 (×2): 10 mg via INTRAVENOUS

## 2024-01-14 MED ORDER — FENTANYL CITRATE (PF) 100 MCG/2ML IJ SOLN
INTRAMUSCULAR | Status: DC | PRN
  Administered 2024-01-14 (×2): 50 ug via INTRAVENOUS

## 2024-01-14 MED ORDER — ASPIRIN 81 MG PO TBEC
81.0000 mg | DELAYED_RELEASE_TABLET | Freq: Every day | ORAL | Status: DC
  Administered 2024-01-15 – 2024-02-05 (×22): 81 mg via ORAL
  Filled 2024-01-14 (×22): qty 1

## 2024-01-14 MED ORDER — BUPIVACAINE HCL (PF) 0.25 % IJ SOLN
INTRAMUSCULAR | Status: DC | PRN
  Administered 2024-01-14 (×2): 30 mL via PERINEURAL

## 2024-01-14 MED ORDER — FENTANYL CITRATE (PF) 100 MCG/2ML IJ SOLN
INTRAMUSCULAR | Status: AC
  Filled 2024-01-14: qty 2

## 2024-01-14 MED ORDER — POLYETHYLENE GLYCOL 3350 17 G PO PACK
17.0000 g | PACK | Freq: Every day | ORAL | Status: DC | PRN
  Administered 2024-01-18 – 2024-01-24 (×4): 17 g via ORAL
  Filled 2024-01-14 (×4): qty 1

## 2024-01-14 MED ORDER — PHENYLEPHRINE HCL-NACL 20-0.9 MG/250ML-% IV SOLN
INTRAVENOUS | Status: DC | PRN
Start: 2024-01-14 — End: 2024-01-14
  Administered 2024-01-14 (×2): 30 ug/min via INTRAVENOUS

## 2024-01-14 MED ORDER — LEVOTHYROXINE SODIUM 50 MCG PO TABS
50.0000 ug | ORAL_TABLET | Freq: Every day | ORAL | Status: DC
  Administered 2024-01-15 – 2024-02-05 (×20): 50 ug via ORAL
  Filled 2024-01-14 (×21): qty 1

## 2024-01-14 MED ORDER — ORAL CARE MOUTH RINSE: 15.0000 mL | Freq: Once | OROMUCOSAL | Status: AC

## 2024-01-14 MED ORDER — SEVELAMER CARBONATE 800 MG PO TABS
800.0000 mg | ORAL_TABLET | Freq: Three times a day (TID) | ORAL | Status: DC
  Administered 2024-01-15 – 2024-02-05 (×38): 800 mg via ORAL
  Filled 2024-01-14 (×41): qty 1

## 2024-01-14 MED ORDER — HYDROMORPHONE HCL 1 MG/ML IJ SOLN
0.5000 mg | INTRAMUSCULAR | Status: DC | PRN
  Administered 2024-01-18 – 2024-01-29 (×24): 0.5 mg via INTRAVENOUS
  Filled 2024-01-14 (×23): qty 0.5

## 2024-01-14 MED ORDER — CEFAZOLIN SODIUM-DEXTROSE 2-4 GM/100ML-% IV SOLN
2.0000 g | INTRAVENOUS | Status: AC
  Administered 2024-01-14 (×2): 2 g via INTRAVENOUS
  Filled 2024-01-14: qty 100

## 2024-01-14 MED ORDER — SENNA 8.6 MG PO TABS
1.0000 | ORAL_TABLET | Freq: Two times a day (BID) | ORAL | Status: DC
  Administered 2024-01-14 – 2024-01-19 (×9): 8.6 mg via ORAL
  Filled 2024-01-14 (×9): qty 1

## 2024-01-14 MED ORDER — FENTANYL CITRATE (PF) 100 MCG/2ML IJ SOLN
INTRAMUSCULAR | Status: AC
  Administered 2024-01-14 (×2): 50 ug
  Filled 2024-01-14: qty 2

## 2024-01-14 MED ORDER — DEXMEDETOMIDINE HCL IN NACL 80 MCG/20ML IV SOLN
INTRAVENOUS | Status: DC | PRN
  Administered 2024-01-14: 8 ug via INTRAVENOUS
  Administered 2024-01-14 (×2): 4 ug via INTRAVENOUS
  Administered 2024-01-14: 8 ug via INTRAVENOUS
  Administered 2024-01-14 (×2): 4 ug via INTRAVENOUS

## 2024-01-14 MED ORDER — ACETAMINOPHEN 650 MG RE SUPP: 650.0000 mg | Freq: Four times a day (QID) | RECTAL | Status: DC | PRN

## 2024-01-14 MED ORDER — HEPARIN SODIUM (PORCINE) 5000 UNIT/ML IJ SOLN
5000.0000 [IU] | Freq: Three times a day (TID) | INTRAMUSCULAR | Status: DC
  Administered 2024-01-15 – 2024-01-16 (×5): 5000 [IU] via SUBCUTANEOUS
  Filled 2024-01-14 (×4): qty 1

## 2024-01-14 MED ORDER — ONDANSETRON HCL 4 MG/2ML IJ SOLN
INTRAMUSCULAR | Status: DC | PRN
  Administered 2024-01-14 (×2): 4 mg via INTRAVENOUS

## 2024-01-14 SURGICAL SUPPLY — 30 items
BAG COUNTER SPONGE SURGICOUNT (BAG) ×1 IMPLANT
BLADE AVERAGE 25X9 (BLADE) IMPLANT
BLADE SAW SGTL 81X20 HD (BLADE) ×1 IMPLANT
BNDG ELASTIC 4INX 5YD STR LF (GAUZE/BANDAGES/DRESSINGS) IMPLANT
BNDG ELASTIC 4X5.8 VLCR STR LF (GAUZE/BANDAGES/DRESSINGS) ×1 IMPLANT
BNDG GAUZE DERMACEA FLUFF 4 (GAUZE/BANDAGES/DRESSINGS) ×1 IMPLANT
BNDG STRETCH GAUZE 3IN X12FT (GAUZE/BANDAGES/DRESSINGS) IMPLANT
CANISTER SUCTION 3000ML PPV (SUCTIONS) ×1 IMPLANT
COVER SURGICAL LIGHT HANDLE (MISCELLANEOUS) ×1 IMPLANT
DRAPE EXTREMITY T 121X128X90 (DISPOSABLE) IMPLANT
DRAPE HALF SHEET 40X57 (DRAPES) ×1 IMPLANT
ELECTRODE REM PT RTRN 9FT ADLT (ELECTROSURGICAL) ×1 IMPLANT
GAUZE SPONGE 4X4 12PLY STRL (GAUZE/BANDAGES/DRESSINGS) ×1 IMPLANT
GLOVE SURG SS PI 7.5 STRL IVOR (GLOVE) ×3 IMPLANT
GOWN STRL REUS W/ TWL LRG LVL3 (GOWN DISPOSABLE) ×2 IMPLANT
GOWN STRL REUS W/ TWL XL LVL3 (GOWN DISPOSABLE) ×1 IMPLANT
KIT BASIN OR (CUSTOM PROCEDURE TRAY) ×1 IMPLANT
KIT TURNOVER KIT B (KITS) ×1 IMPLANT
NDL HYPO 25GX1X1/2 BEV (NEEDLE) IMPLANT
NEEDLE HYPO 25GX1X1/2 BEV (NEEDLE) IMPLANT
NS IRRIG 1000ML POUR BTL (IV SOLUTION) ×1 IMPLANT
PACK GENERAL/GYN (CUSTOM PROCEDURE TRAY) ×1 IMPLANT
PAD ARMBOARD POSITIONER FOAM (MISCELLANEOUS) ×2 IMPLANT
SPECIMEN JAR SMALL (MISCELLANEOUS) ×1 IMPLANT
SUT ETHILON 3 0 PS 1 (SUTURE) ×1 IMPLANT
SYR CONTROL 10ML LL (SYRINGE) IMPLANT
TOWEL GREEN STERILE (TOWEL DISPOSABLE) ×2 IMPLANT
TOWEL GREEN STERILE FF (TOWEL DISPOSABLE) ×1 IMPLANT
UNDERPAD 30X36 HEAVY ABSORB (UNDERPADS AND DIAPERS) ×1 IMPLANT
WATER STERILE IRR 1000ML POUR (IV SOLUTION) ×1 IMPLANT

## 2024-01-14 NOTE — Transfer of Care (Signed)
 Immediate Anesthesia Transfer of Care Note  Patient: Phillip [name]  Procedure(s) Performed: AMPUTATION, FOOT, PARTIAL (Left: Foot)  Patient Location: PACU  Anesthesia Type:MAC  Level of Consciousness: awake and alert   Airway & Oxygen Therapy: Patient Spontanous Breathing  Post-op Assessment: Report given to RN and Post -op Vital signs reviewed and stable  Post vital signs: Reviewed and stable  Last Vitals:  Vitals Value Taken Time  BP 102/57 01/14/24 16:27  Temp    Pulse 69 01/14/24 16:29  Resp 10 01/14/24 16:29  SpO2 97 % 01/14/24 16:29  Vitals shown include unfiled device data.  Last Pain:  Vitals:   01/14/24 1432  TempSrc:   PainSc: 0-No pain      Patients Stated Pain Goal: 4 (01/14/24 1119)  Complications: No notable events documented.

## 2024-01-14 NOTE — H&P (Signed)
 History and Physical    Patient: Phillip [name] FMW:[id number] DOB: [birth year] DOA: 01/14/2024 DOS: the patient was seen and examined on 01/14/2024 PCP: [name] LABOR., MD  Patient coming from: Home  Chief Complaint: Rectal gangrene.  HPI: Phillip [name] is a 68 y.o. male with medical history significant of ESRD on HD TTS, DM-1, recent acute cholecystitis June 2025) requiring cholecystostomy tube placement, chronic HFrEF, aortic stenosis (TAVR postponed due to left third toe gangrene), PAD-s/p PCI to left tibia-fibula/angioplasty on 7/25 who was brought by vascular surgery today for an elective left third toe amputation.  Hospitalist service was asked to admit this patient given significant medical issues.  Per history/chart review-patient has had significant issues with nonhealing ulceration of his left foot-progressing into a gangrenous left third toe.   Noninvasive vascular studies showed noncompressible vessels-hence he underwent a angiogram of LLE with transluminal angioplasty/stent placement to the left tibioperoneal trunk on 7/25 at [facility].  Plans were to proceed with a left third toe amputation post revascularization.  However due to his significant underlying medical comorbidities, this could not be done at [facility], so he was referred to the [facility] surgery service.  He was subsequently brought to the OR today-where he underwent left third toe amputation including the metatarsal head.  Unfortunately-the bone was not healthy-and there was no capillary bleeding at this level.  It was felt that the patient probably would require a TMA or a BKA, however patient refused.  The wound was left open-but packed with Betadine  soaked gauze and a sterile dressing.  Patient was brought to the PACU where the hospitalist service was asked to admit this patient.  Patient denies any fever. No recent history of nausea, vomiting or diarrhea No shortness of breath or chest pain Patient still makes a  minimal amount of urine. Per patient-he has been compliant with his medications and his hemodialysis schedule.   Review of Systems: As mentioned in the history of present illness. All other systems reviewed and are negative. Past Medical History:  Diagnosis Date   Abdominopelvic abscess (HCC) 09/29/2018   Abscess of appendix 09/22/2018   Acquired spondylolisthesis of lumbosacral region 03/20/2020   Acute cholecystitis 11/15/2023   Acute kidney injury (HCC) 10/07/2018   Acute kidney injury superimposed on chronic kidney disease (HCC) 11/06/2020   Acute kidney injury superimposed on CKD (HCC) 02/18/2020   Acute on chronic systolic (congestive) heart failure (HCC) 07/26/2015   Formatting of this note might be different from the original. Formatting of this note might be different from the original. Formatting of this note might be different from the original. revenkar EF 35% Formatting of this note might be different from the original. revenkar EF 35%     Acute on chronic systolic CHF (congestive heart failure) (HCC) 08/12/2023   Anasarca 07/01/2023   Anemia of chronic disease 08/18/2019   Anemia, chronic disease 08/18/2019   Aortic atherosclerosis (HCC)    Aortic stenosis    a.) TTE 09/15/2018: mild-mod (MPG 19); b.) TTE 03/21/2021: mild-mod (MPG 14.3); c.) TTE 04/24/2022: mild- mod (MPG 15)   Aortic stenosis, moderate 10/15/2019   Appendicitis 09/08/2018   ARF (acute renal failure) (HCC) 02/19/2020   Asthma    Atherosclerosis of native arteries of the extremities with ulceration (HCC) 02/24/2018   Formatting of this note might be different from the original.  Last Assessment & Plan:   His ABIs today are 1.07 on the right and 0.99 on the left with biphasic waveforms.  Although these pressures  may be somewhat elevated from medial calcification, his flow does appear to be reasonably good.  His left ABI was 0.58 prior to intervention.  At this point, we will stretch out his follow-up and see  hi   Benign hypertension with CKD (chronic kidney disease) stage IV (HCC) 02/24/2018   Benign prostatic hyperplasia without lower urinary tract symptoms 01/14/2018   Bilateral lower extremity edema 11/04/2018   Bradycardia 03/25/2018   Bruit of right carotid artery 07/26/2015   CAD (coronary artery disease) 2003   a.) s/p 4v CABG 2003   Cardiac murmur 07/26/2015   Choledocholithiasis 11/12/2023   Chronic combined systolic and diastolic CHF (congestive heart failure) (HCC) 07/26/2015   a.) TTE 09/15/2018: EF 50-55%, mod LVH, RVE, BAE, mild-mod TR, AoV sclerosis, G1DD; b.) MPI 10/27/2019: EF <30%; c.) TTE 03/21/2021: EF 35-40%, post AK, inf HK, mod LVH, mod red RVSF, mod LAE, mod Aov sclerosis, G2DD; c.) TTE 04/24/2022: EF 35-40%, post AK, glok HK, mod LVE, mod red RVSF, mild-mod MR, Aov sclerosis, G3DD   Chronic idiopathic constipation 11/02/2018   Chronic low back pain without sciatica 12/02/2018   Chronic pain of right hip 03/31/2020   Chronic systolic heart failure (HCC) 07/26/2015   Formatting of this note might be different from the original. revenkar EF 35%     CKD (chronic kidney disease) stage 4, GFR 15-29 ml/min (HCC) 10/07/2018   Congestive heart failure (HCC) 07/26/2015   Last Assessment & Plan:   Poor cardiac function could certainly be contributing to poor blood supply to the feet and toes as well.     Formatting of this note might be different from the original.  Formatting of this note might be different from the original.  Last Assessment & Plan:   Poor cardiac function could certainly be contributing to poor blood supply to the feet and toes as well.     Last   Coronary artery disease    Diabetes mellitus without complication (HCC)    Dysphagia 07/26/2015   Encounter for long-term (current) use of insulin  (HCC) 09/27/2020   Erectile dysfunction 07/14/2017   ESRD on dialysis [facility]) 11/15/2023   Essential hypertension 02/24/2018   Fever 11/07/2020   Foot ulcer (HCC)  09/27/2020   left foot  Formatting of this note might be different from the original.  Formatting of this note might be different from the original.  left foot     Foot ulcer, left (HCC)    GERD (gastroesophageal reflux disease)    Heart murmur 07/26/2015   Hematuria 07/27/2015   High risk medication use 07/26/2015   History of marijuana use    Hyperkalemia 05/29/2020   Hyperlipidemia 07/26/2015   Hyperlipidemia associated with type 2 diabetes mellitus (HCC) 02/18/2020   Hypertension    Hypertension associated with diabetes (HCC)    Hypoglycemia 05/07/2019   Hypothyroidism 11/14/2015   Hypothyroidism (acquired) 11/14/2015   Insomnia 11/02/2018   Kidney insufficiency    Lumbar spondylosis 03/20/2020   Malaise and fatigue 03/25/2018   Malnutrition of moderate degree 08/20/2023   Mitral regurgitation 05/12/2019   Moderate aortic regurgitation 05/12/2019   Myocardial infarction due to demand ischemia (HCC) 09/26/2020   a.) Type II NSTEMI; b.) troponins were trended 0.54 --> 0.56 --> 0.52 ng/mL   Non-compliance 05/05/2019   NSTEMI (non-ST elevated myocardial infarction) (HCC) 09/27/2020   Osseous and subluxation stenosis of intervertebral foramina of lumbar region 03/20/2020   Osteoarthritis 10/06/2019   Pancytopenia (HCC) 08/12/2023   Peripheral vascular disease (HCC)  Personal history of tobacco use, presenting hazards to health 09/27/2020   Polyneuropathy in diabetes (HCC) 05/07/2019   Primary osteoarthritis involving multiple joints 10/06/2019   Primary osteoarthritis of right hip 02/21/2020   Pulmonary HTN (HCC)    a.) TTE 05/12/2019: PASP 71; b.) TTE 09/27/2020: PASP >70; c.) TTE 03/21/2021: RVSP 43; d.) TTE 04/24/2022: RVSP 80.6   Puncture wound of right hip 04/02/2018   PVD (peripheral vascular disease) with claudication (HCC)    a.) s/p PTA 03/02/2018 - balloon angioplasty LEFT below knee popliteal artery; b.) s/p PTA 09/30/2022: baloon angioplasty LEFT tibioperoneal  trunck, most proximal peroneal artery, and LEFT popliteal artery.   Regional wall motion abnormality of heart 05/07/2019   Right groin pain 02/18/2020   S/P CABG x 4 2003   Screening for colon cancer 02/18/2018   Sepsis (HCC) 11/15/2023   Sleep apnea 07/26/2015   a.) does not require nocturnal PAP therapy   Subacute osteomyelitis of left foot (HCC) 03/25/2018   Thrombocytopenia (HCC)    Type 2 diabetes mellitus with hyperlipidemia (HCC) 07/26/2015   Type 2 diabetes mellitus with stage 4 chronic kidney disease, with long-term current use of insulin  (HCC) 07/26/2015   Type II diabetes mellitus with neurological manifestations (HCC) 02/18/2020   Vitamin B12 deficiency 06/16/2019   Vitamin D deficiency 05/07/2018   Past Surgical History:  Procedure Laterality Date   AMPUTATION TOE Left 03/13/2018   Procedure: AMPUTATION TOE-MPJ;  Surgeon: Ashley Soulier, DPM;  Location: [facility] ORS;  Service: Podiatry;  Laterality: Left;   ANGIOPLASTY     AV FISTULA PLACEMENT Left 08/20/2023   Procedure: ARTERIOVENOUS (AV) FISTULA CREATION;  Surgeon: Gretta Lonni PARAS, MD;  Location: [facility] OR;  Service: Vascular;  Laterality: Left;   CARDIAC CATHETERIZATION     CORONARY ARTERY BYPASS GRAFT N/A 2003   IR CATHETER TUBE CHANGE  12/31/2023   IR FLUORO GUIDE CV LINE RIGHT  08/18/2023   IR PERC CHOLECYSTOSTOMY  11/12/2023   IR RADIOLOGIST EVAL & MGMT  12/31/2023   IR US  GUIDE VASC ACCESS RIGHT  08/18/2023   LAPAROSCOPIC APPENDECTOMY N/A 09/08/2018   Procedure: APPENDECTOMY LAPAROSCOPIC;  Surgeon: Jordis Laneta FALCON, MD;  Location: [facility] ORS;  Service: General;  Laterality: N/A;   LOWER EXTREMITY ANGIOGRAPHY Left 03/02/2018   Procedure: LOWER EXTREMITY ANGIOGRAPHY;  Surgeon: Marea Selinda RAMAN, MD;  Location: [facility] INVASIVE CV LAB;  Service: Cardiovascular;  Laterality: Left;   LOWER EXTREMITY ANGIOGRAPHY Left 09/30/2022   Procedure: Lower Extremity Angiography;  Surgeon: Marea Selinda RAMAN, MD;  Location: [facility] INVASIVE CV LAB;   Service: Cardiovascular;  Laterality: Left;   LOWER EXTREMITY ANGIOGRAPHY Left 12/26/2023   Procedure: Lower Extremity Angiography;  Surgeon: Marea Selinda RAMAN, MD;  Location: [facility] INVASIVE CV LAB;  Service: Cardiovascular;  Laterality: Left;   RIGHT/LEFT HEART CATH AND CORONARY/GRAFT ANGIOGRAPHY N/A 08/01/2023   Procedure: RIGHT/LEFT HEART CATH AND CORONARY/GRAFT ANGIOGRAPHY;  Surgeon: Wendel Lurena POUR, MD;  Location: [facility] INVASIVE CV LAB;  Service: Cardiovascular;  Laterality: N/A;   ROTATOR CUFF REPAIR Left    Social History:  reports that he quit smoking about 22 years ago. His smoking use included cigarettes. He started smoking about 47 years ago. He has a 12.5 pack-year smoking history. He has never used smokeless tobacco. He reports that he does not currently use alcohol. He reports that he does not use drugs.  No Known Allergies  Family History  Problem Relation Age of Onset   Hyperlipidemia Mother    Hypertension Mother  Diabetes Mother    Heart attack Brother    Heart disease Brother    Lung disease Father     Prior to Admission medications   Medication Sig Start Date End Date Taking? Authorizing Provider  acetaminophen  (TYLENOL ) 500 MG tablet Take 1,000 mg by mouth every 6 (six) hours as needed for moderate pain or headache.    Yes [provider]  aspirin  EC 81 MG tablet Take 1 tablet (81 mg total) by mouth daily. Swallow whole. 07/25/23  Yes Thukkani, Arun K, MD  atorvastatin  (LIPITOR) 20 MG tablet Take 1 tablet (20 mg total) by mouth daily. 07/25/23  Yes Thukkani, Arun K, MD  carvedilol  (COREG ) 3.125 MG tablet Take 1 tablet (3.125 mg total) by mouth 2 (two) times daily. 05/20/23 05/02/24 Yes Carlin Delon BROCKS, NP  clopidogrel  (PLAVIX ) 75 MG tablet Take 1 tablet (75 mg total) by mouth daily. 12/26/23  Yes Dew, Selinda RAMAN, MD  gabapentin  (NEURONTIN ) 300 MG capsule Take 1 capsule (300 mg total) by mouth at bedtime. Patient taking differently: Take 600 mg by mouth 2 (two) times  daily. 08/25/23  Yes Arrien, Mauricio Daniel, MD  insulin  glargine (LANTUS ) 100 UNIT/ML injection Inject 7 Units into the skin daily as needed (Diabetes). Inject 7 units at bedtime as needed after checking glucose   Yes [provider]  isosorbide  mononitrate (IMDUR ) 120 MG 24 hr tablet Take 1 tablet (120 mg total) by mouth daily. 08/27/23  Yes Revankar, Jennifer SAUNDERS, MD  latanoprost  (XALATAN ) 0.005 % ophthalmic solution Place 1 drop into both eyes at bedtime. 04/15/23  Yes [provider]  levothyroxine  (SYNTHROID ) 50 MCG tablet Take 50 mcg by mouth daily. 04/05/23  Yes [provider]  Methoxy PEG-Epoetin Beta (MIRCERA IJ) 30 mcg. 12/16/23 12/14/24 Yes [provider]  nitroGLYCERIN  (NITROSTAT ) 0.4 MG SL tablet Place 1 tablet (0.4 mg total) under the tongue every 5 (five) minutes as needed for chest pain. 11/20/20  Yes Revankar, Rajan R, MD  sevelamer  carbonate (RENVELA ) 800 MG tablet Take 800 mg by mouth 3 (three) times daily with meals. 09/04/23 11/29/24 Yes [provider]  tamsulosin  (FLOMAX ) 0.4 MG CAPS capsule Take 0.4 mg by mouth daily after breakfast. 04/19/20  Yes [provider]  traMADol  (ULTRAM ) 50 MG tablet Take 50 mg by mouth every 8 (eight) hours as needed. 09/15/23  Yes [provider]  sodium chloride  flush (NS) 0.9 % SOLN Inject 5 mLs into the vein daily for 15 days. flush drain daily with 5 cc NS, record output daily, dressing changes every 2-3 days or earlier if soiled 11/14/23 12/15/23  Darci Pore, MD  sodium chloride  flush 0.9 % SOLN injection 10 mLs by Intracatheter route every 3 (three) days. 01/02/24 04/01/24  Tonette Warren SAUNDERS, NP    Physical Exam: Vitals:   01/14/24 1630 01/14/24 1645 01/14/24 1700 01/14/24 1715  BP: (!) 95/58 (!) 102/55 125/63 123/63  Pulse: 72 69 69 66  Resp: 14 10 16 16   Temp: 98.2 F (36.8 C)     TempSrc:      SpO2: 96% 93% 100% 100%  Weight:      Height:       Gen Exam:Alert awake-not  in any distress HEENT:atraumatic, normocephalic Chest: B/L clear to auscultation anteriorly CVS:S1S2 regular,+ systolic murmur heard all over the precordium. Abdomen:soft non tender, non distended Extremities:no edema-left foot in dressing-did not Neurology: Non focal Skin: no rash   Data Reviewed:    Latest Ref Rng & Units 01/14/2024  11:35 AM 11/14/2023    5:52 AM 11/13/2023    6:52 AM  CBC  WBC 4.0 - 10.5 K/uL  6.8  6.3   Hemoglobin 13.0 - 17.0 g/dL 88.3  88.5  88.3   Hematocrit 39.0 - 52.0 % 34.0  35.1  36.8   Platelets 150 - 400 K/uL  95  85          Latest Ref Rng & Units 01/14/2024   11:35 AM 11/14/2023    5:52 AM 11/13/2023    6:52 AM  BMP  Glucose 70 - 99 mg/dL 887  887  824   BUN 8 - 23 mg/dL 29  35  61   Creatinine 0.61 - 1.24 mg/dL 3.89  4.51  2.45   Sodium 135 - 145 mmol/L 137  135  135   Potassium 3.5 - 5.1 mmol/L 4.0  4.1  4.7   Chloride 98 - 111 mmol/L 96  95  94   CO2 22 - 32 mmol/L  25  22   Calcium  8.9 - 10.3 mg/dL  8.4  8.0     Assessment and Plan: Left third toe gangrene S/p amputation of left third toe-unfortunately-intraoperatively-bone felt to be did not healthy and no capillary level bleeding was evident.  It was felt that patient probably would benefit from either TMA or BKA.  Unfortunately patient refused-wound has been left open-vascular surgery to have more discussion with patient over next several days. I briefly discussed with patient-he understands that if he has any nonhealing wound/amputation site-this could serve as a nidus of infection in the future-not likely qualify for TAVR.  PAD s/p angioplasty/PCI to left tibioperoneal trunk on 7/25 See above regarding left third toe tissue. Continue aspirin /Plavix /statin.  ESRD on HD TTS Will notify nephrology of patient's admission-will require dialysis tomorrow.  Per patient he completed his usual hemodialysis session yesterday. Per chart review-patient was tunneled HD catheter was recently  removed on 8/6, he currently has a functioning LUE AVF  Addendum Notified nephrology on-call outpatient admission-they will see him tomorrow.  He is currently stable.   Normocytic anemia Mild Likely secondary to ESRD Stable for monitoring  Thrombocytopenia Appears to be a chronic issue Mild No evidence of bleeding Follow CBC.  Chronic HFrEF Euvolemic Volume removal with hemodialysis Continue beta-blocker/Imdur .  Severe aortic stenosis Being followed by cardiology-plans are for TAVR once necrotic toe issues have been dealt with. Unfortunately-patient refusing more proximal amputation (TMA/BKA)-left third toe amputation site status nonviable for wound healing per vascular surgery operative note.  Patient aware that this will likely serve as a nidus of sectioning-and he will likely not qualify for TAVR.  History of CAD s/p CABG 2003 No anginal symptoms On aspirin /Plavix /statin.  Recent history of acute cholecystitis requiring cholecystostomy drain June 2025 Cholecystostomy drain remains in place Routine drain care Reviewed outpatient general surgery note-definite surgery to be done post TAVR  DM-2 Placing on SSI Follow CBGs and optimize accordingly.  Hypothyroidism Synthroid    Advance Care Planning:   Code Status: Full Code   Consults: VVS  Family Communication: None at bedside  Severity of Illness: The appropriate patient status for this patient is INPATIENT. Inpatient status is judged to be reasonable and necessary in order to provide the required intensity of service to ensure the patient's safety. The patient's presenting symptoms, physical exam findings, and initial radiographic and laboratory data in the context of their chronic comorbidities is felt to place them at high risk for further clinical deterioration. Furthermore, it is  not anticipated that the patient will be medically stable for discharge from the hospital within 2 midnights of admission.   * I  certify that at the point of admission it is my clinical judgment that the patient will require inpatient hospital care spanning beyond 2 midnights from the point of admission due to high intensity of service, high risk for further deterioration and high frequency of surveillance required.*  Author: Donalda Applebaum, MD 01/14/2024 5:30 PM  For on call review [email or web address].

## 2024-01-14 NOTE — Interval H&P Note (Signed)
 History and Physical Interval Note:  01/14/2024 2:19 PM  Phillip [name]  has presented today for surgery, with the diagnosis of Gangrene of toe.  The various methods of treatment have been discussed with the patient and family. After consideration of risks, benefits and other options for treatment, the patient has consented to  Procedure(s): AMPUTATION, FOOT, PARTIAL (Left) as a surgical intervention.  The patient's history has been reviewed, patient examined, no change in status, stable for surgery.  I have reviewed the patient's chart and labs.  Questions were answered to the patient's satisfaction.     [name]

## 2024-01-14 NOTE — Op Note (Signed)
    Patient name: RISHAWN WALCK MRN: [id number] DOB: 06/01/1956 Sex: male  01/14/2024 Pre-operative Diagnosis: Gangrenous left third toe Post-operative diagnosis:  Same Surgeon:  [name] Procedure:   Left third toe amputation including metatarsal head Anesthesia:  regional Blood Loss:  none Specimens:  none  Findings: Minimal bleeding at the amputation site.  Indications: This is a 68 year old gentleman with a necrotic left third toe.  He has undergone revascularization at [facility].  Because of his comorbidities, he needed anesthesia done at [facility].  I discussed in the holding area that he may lose more than just 1 toe.  He really wanted just the single toe amputated.  Procedure:  The patient was identified in the holding area and taken to [facility] OR ROOM 16  The patient was then placed supine on the table. regional anesthesia was administered.  The patient was prepped and draped in the usual sterile fashion.  A time out was called and antibiotics were administered.  A racquet type incision was made on the dorsum of the foot extending around the necrotic third toe.  This was done with a 10 blade and carried down to the bone.  A periosteal elevator was used to elevate the periosteum and a bone cutter was used to transect the bone.  The toe was removed.  The bone was not healthy at this level and there was no capillary bleeding.  I then dissected back further proximal and expose the metatarsal head and then resected this with bone cutters and a rongeur.  Even at this level there was no bleeding.  In addition the fourth toe does not appear to be viable.  It had blistering over top of the skin and some discoloration on the posterior side.  At this point, I did not feel any additional amputation was necessary as the patient is going to more than likely require a TMA or BKA.  I packed the wound with Betadine  soaked gauze and a sterile dressing.  He was taken to recovery in stable  condition.   Disposition: To PACU stable   V. [name], M.D., [facility] Office: [phone] Pager:  [phone]

## 2024-01-14 NOTE — Anesthesia Procedure Notes (Signed)
 Anesthesia Regional Block: Popliteal block   Pre-Anesthetic Checklist: , timeout performed,  Correct Patient, Correct Site, Correct Laterality,  Correct Procedure, Correct Position, site marked,  Risks and benefits discussed,  Surgical consent,  Pre-op  evaluation,  At surgeon's request and post-op pain management  Laterality: Left  Prep: chloraprep       Needles:  Injection technique: Single-shot  Needle Type: Echogenic Stimulator Needle     Needle Length: 10cm  Needle Gauge: 20     Additional Needles:   Procedures:,,,, ultrasound used (permanent image in chart),,    Narrative:  Start time: 01/14/2024 2:20 PM End time: 01/14/2024 2:30 PM Injection made incrementally with aspirations every 5 mL.  Performed by: Personally  Anesthesiologist: Patrisha Bernardino SQUIBB, MD  Additional Notes: Functioning IV was confirmed and monitors were applied.  A timeout was performed. Sterile prep, hand hygiene and sterile gloves were used. A 20ga Bbraun echogenic stimulator needle was used. Negative aspiration and negative test dose prior to incremental administration of local anesthetic. The patient tolerated the procedure well.  Ultrasound guidance: relevent anatomy identified, needle position confirmed, local anesthetic spread visualized around nerve(s), vascular puncture avoided.  Image printed for medical record.

## 2024-01-15 ENCOUNTER — Encounter (HOSPITAL_COMMUNITY): Payer: Self-pay | Admitting: Surgery

## 2024-01-15 ENCOUNTER — Other Ambulatory Visit: Payer: Self-pay

## 2024-01-15 DIAGNOSIS — I96 Gangrene, not elsewhere classified: Secondary | ICD-10-CM | POA: Diagnosis not present

## 2024-01-15 DIAGNOSIS — Z89422 Acquired absence of other left toe(s): Secondary | ICD-10-CM

## 2024-01-15 DIAGNOSIS — Z4889 Encounter for other specified surgical aftercare: Secondary | ICD-10-CM

## 2024-01-15 LAB — COMPREHENSIVE METABOLIC PANEL WITH GFR
ALT: 11 U/L (ref 0–44)
AST: 19 U/L (ref 15–41)
Albumin: 2.1 g/dL — ABNORMAL LOW (ref 3.5–5.0)
Alkaline Phosphatase: 142 U/L — ABNORMAL HIGH (ref 38–126)
Anion gap: 12 (ref 5–15)
BUN: 38 mg/dL — ABNORMAL HIGH (ref 8–23)
CO2: 31 mmol/L (ref 22–32)
Calcium: 8.6 mg/dL — ABNORMAL LOW (ref 8.9–10.3)
Chloride: 94 mmol/L — ABNORMAL LOW (ref 98–111)
Creatinine, Ser: 7.32 mg/dL — ABNORMAL HIGH (ref 0.61–1.24)
GFR, Estimated: 8 mL/min — ABNORMAL LOW (ref >60)
Glucose, Bld: 89 mg/dL (ref 70–99)
Potassium: 4.2 mmol/L (ref 3.5–5.1)
Sodium: 137 mmol/L (ref 135–145)
Total Bilirubin: 0.8 mg/dL (ref 0.0–1.2)
Total Protein: 7.6 g/dL (ref 6.5–8.1)

## 2024-01-15 LAB — CBC
HCT: 32.1 % — ABNORMAL LOW (ref 39.0–52.0)
Hemoglobin: 10.2 g/dL — ABNORMAL LOW (ref 13.0–17.0)
MCH: 30.5 pg (ref 26.0–34.0)
MCHC: 31.8 g/dL (ref 30.0–36.0)
MCV: 96.1 fL (ref 80.0–100.0)
Platelets: 182 K/uL (ref 150–400)
RBC: 3.34 MIL/uL — ABNORMAL LOW (ref 4.22–5.81)
RDW: 15 % (ref 11.5–15.5)
WBC: 8.6 K/uL (ref 4.0–10.5)
nRBC: 0 % (ref 0.0–0.2)

## 2024-01-15 LAB — GLUCOSE, CAPILLARY
Glucose-Capillary: 96 mg/dL (ref 70–99)
Glucose-Capillary: 115 mg/dL — ABNORMAL HIGH (ref 70–99)
Glucose-Capillary: 117 mg/dL — ABNORMAL HIGH (ref 70–99)
Glucose-Capillary: 129 mg/dL — ABNORMAL HIGH (ref 70–99)

## 2024-01-15 LAB — HEPATITIS B SURFACE ANTIGEN: Hepatitis B Surface Ag: NONREACTIVE

## 2024-01-15 MED ORDER — CHLORHEXIDINE GLUCONATE CLOTH 2 % EX PADS
6.0000 | MEDICATED_PAD | Freq: Every day | CUTANEOUS | Status: DC
  Administered 2024-01-15 – 2024-01-16 (×2): 6 via TOPICAL

## 2024-01-15 NOTE — H&P (View-Only) (Signed)
 Progress Note    01/15/2024 10:55 AM 1 Day Post-Op  Subjective: Says that his left foot feels sore    Vitals:   01/15/24 0900 01/15/24 1000  BP: 108/60 (!) 92/55  Pulse: 69 64  Resp:    Temp:    SpO2: 94% 95%    Physical Exam: General: Laying in bed, NAD Cardiac: Regular Lungs: Nonlabored Extremities: Open left third toe amputation packed with Betadine  gauze and dry dressing.  Stable tissue loss of the left fourth toe   CBC    Component Value Date/Time   WBC 8.6 01/15/2024 0338   RBC 3.34 (L) 01/15/2024 0338   HGB 10.2 (L) 01/15/2024 0338   HGB 11.5 (L) 05/16/2023 1429   HCT 32.1 (L) 01/15/2024 0338   HCT 35.3 (L) 05/16/2023 1429   PLT 182 01/15/2024 0338   PLT 144 (L) 05/16/2023 1429   MCV 96.1 01/15/2024 0338   MCV 90 05/16/2023 1429   MCH 30.5 01/15/2024 0338   MCHC 31.8 01/15/2024 0338   RDW 15.0 01/15/2024 0338   RDW 13.5 05/16/2023 1429   LYMPHSABS 0.7 11/11/2023 1930   LYMPHSABS 0.7 05/16/2023 1429   MONOABS 0.8 11/11/2023 1930   EOSABS 0.0 11/11/2023 1930   EOSABS 0.1 05/16/2023 1429   BASOSABS 0.0 11/11/2023 1930   BASOSABS 0.0 05/16/2023 1429    BMET    Component Value Date/Time   NA 137 01/15/2024 0338   NA 145 (H) 07/14/2023 1412   NA 136 08/30/2013 1025   K 4.2 01/15/2024 0338   K 3.7 08/30/2013 1025   CL 94 (L) 01/15/2024 0338   CL 101 08/30/2013 1025   CO2 31 01/15/2024 0338   CO2 31 08/30/2013 1025   GLUCOSE 89 01/15/2024 0338   GLUCOSE 310 (H) 08/30/2013 1025   BUN 38 (H) 01/15/2024 0338   BUN 97 (HH) 07/14/2023 1412   BUN 17 08/30/2013 1025   CREATININE 7.32 (H) 01/15/2024 0338   CREATININE 1.15 08/30/2013 1025   CALCIUM  8.6 (L) 01/15/2024 0338   CALCIUM  9.4 08/30/2013 1025   GFRNONAA 8 (L) 01/15/2024 0338   GFRNONAA >60 08/30/2013 1025   GFRAA 27 (L) 02/22/2020 0539   GFRAA >60 08/30/2013 1025    INR    Component Value Date/Time   INR 1.3 (H) 11/12/2023 1011     Intake/Output Summary (Last 24 hours) at  01/15/2024 1055 Last data filed at 01/14/2024 1621 Gross per 24 hour  Intake 450 ml  Output 3 ml  Net 447 ml      Assessment/Plan:  68 y.o. male is 1 day postop, s/p: Open left third toe amputation   - He is doing okay this morning.  He reports soreness in his left foot -He underwent left open third toe amputation yesterday.  During surgery he had no bleeding, suggestive of poor future healing -On exam his amputation site is dressed and dry.  We can continue Betadine  soaked gauze dressings to the open amputation site -I have briefly discussed with the patient that he has very low chances of healing his amputation site and will very likely require at least a left TMA or BKA in order to heal.  He said he would like to discuss this with his wife. -Further surgeries pending patient decision making.  Dr. Serene has attempted to call the patient's wife several times to discuss surgical options   Ahmed Elna [facility] [phone] 01/15/2024 10:55 AM  I agree with the above.  I have  seen and evaluated the patient.  His wife is at the bedside.  I discussed the findings at surgery included no bleeding from the amputation site.  His toe amputation site is not going to heal due to lack of blood flow despite being maximally revascularized.  The next step is to proceed with transmetatarsal amputation.  I discussed approximately a 40% chance that this would not heal and he would need a more proximal amputation.  He wants to go ahead and get this done.  I am placing him on the operating room schedule tomorrow for a left TMA.  He will be n.p.o. after midnight.  [name]

## 2024-01-15 NOTE — Procedures (Signed)
 I was present at this dialysis session. I have reviewed the session itself and made appropriate changes.   Vital signs in last 24 hours:  Temp:  [97.5 F (36.4 C)-98.5 F (36.9 C)] 98.1 F (36.7 C) (08/14 1155) Pulse Rate:  [57-83] 65 (08/14 1215) Resp:  [10-28] 13 (08/14 1215) BP: (92-135)/(55-102) 122/59 (08/14 1215) SpO2:  [93 %-100 %] 99 % (08/14 1215) Weight:  [82.3 kg] 82.3 kg (08/14 1155) Weight change: 1.262 kg Filed Weights   01/13/24 1034 01/14/24 1107 01/15/24 1155  Weight: 82.2 kg 83.5 kg 82.3 kg    Recent Labs  Lab 01/15/24 0338  NA 137  K 4.2  CL 94*  CO2 31  GLUCOSE 89  BUN 38*  CREATININE 7.32*  CALCIUM  8.6*    Recent Labs  Lab 01/14/24 1135 01/14/24 1839 01/15/24 0338  WBC  --  10.7* 8.6  HGB 11.6* 10.1* 10.2*  HCT 34.0* 32.0* 32.1*  MCV  --  97.0 96.1  PLT  --  192 182    Scheduled Meds:  aspirin  EC  81 mg Oral Daily   atorvastatin   20 mg Oral Daily   carvedilol   3.125 mg Oral BID   Chlorhexidine  Gluconate Cloth  6 each Topical Q0600   clopidogrel   75 mg Oral Daily   heparin   5,000 Units Subcutaneous Q8H   insulin  aspart  0-6 Units Subcutaneous TID WC   isosorbide  mononitrate  120 mg Oral Daily   levothyroxine   50 mcg Oral Daily   senna  1 tablet Oral BID   sevelamer  carbonate  800 mg Oral TID WC   tamsulosin   0.4 mg Oral QPC breakfast   Continuous Infusions: PRN Meds:.acetaminophen  **OR** acetaminophen , albuterol , HYDROmorphone  (DILAUDID ) injection, nitroGLYCERIN , oxyCODONE , polyethylene glycol   Phillip DELENA Sellar,  MD 01/15/2024, 12:32 PM

## 2024-01-15 NOTE — Progress Notes (Signed)
   01/15/24 1645  Vitals  Temp 98.1 F (36.7 C)  Pulse Rate 93  Resp (!) 21  BP 97/65  SpO2 99 %  O2 Device Room Air  Weight 80.3 kg  Type of Weight Post-Dialysis  Post Treatment  Dialyzer Clearance Lightly streaked  Hemodialysis Intake (mL) 0 mL  Liters Processed 75  Fluid Removed (mL) 2000 mL  Tolerated HD Treatment Yes  AVG/AVF Arterial Site Held (minutes) 6 minutes  AVG/AVF Venous Site Held (minutes) 6 minutes   Received patient in bed to unit.  Alert and oriented.  Informed consent signed and in chart.   TX duration:3.5HRS  Patient tolerated well.  Transported back to the room  Alert, without acute distress.  Hand-off given to patient's nurse.   Access used: LAVF Access issues: NONE  Total UF removed: 2L Medication(s) given: NONE    Na'Shaminy T [facility]

## 2024-01-15 NOTE — Consult Note (Addendum)
 [name]ation [facility]    Indication for Consultation:  Management of ESRD/hemodialysis, anemia, hypertension/volume, and secondary hyperparathyroidism.   HPI: [name] is a 68 y.o. male with a signifcant PMH of ESRD on HD, anemia of CKD, DM, recent acute cholecystitis (6/25) requiring tube placement, chronic HFrEF, severe aortic stenosis (TAVR postponed due to toe gangrene), PAD, HTN, PVD, and CABG x 4. He was admitted by vascular surgery on 01/14/24 for elective left third toe amputation. He has significant issues with nonhealing ulcers of his left foot that progressed into a gangrenous left third toe. Noninvasive vascular studies showed noncompressible vessels-hence he underwent a angiogram of LLE with transluminal angioplasty/stent placement to the left tibioperoneal trunk on 7/25 at [name]. Plans were to proceed with a left third toe amputation post revascularization. However due to his significant underlying medical comorbidities, this could not be done at [name], so he was referred to the [name] surgery service. On 8/13 he went with left third toe amputation including metatarsal head. Bone was noted to not be healthy, no capillary bleeding noted, and it was felt that he would require TMA or BKA - patient has deferred this for now.   We were consulted to manage his HD while he was in the hospital. [facility] has HD in [name]. I was able to call the clinic and get his updated HD orders that are listed below. This morning he reports that he is feeling okay. Last HD was 8/12. His clinic reports that he is complaint with HD and gets close to his EDW. Plan for HD today.   Past Medical History:  Diagnosis Date   Abdominopelvic abscess (HCC) 09/29/2018   Abscess of appendix 09/22/2018   Acquired spondylolisthesis of lumbosacral region 03/20/2020   Acute cholecystitis 11/15/2023   Acute kidney injury (HCC) 10/07/2018   Acute kidney injury superimposed on  chronic kidney disease (HCC) 11/06/2020   Acute kidney injury superimposed on CKD (HCC) 02/18/2020   Acute on chronic systolic (congestive) heart failure (HCC) 07/26/2015   Formatting of this note might be different from the original. Formatting of this note might be different from the original. Formatting of this note might be different from the original. revenkar EF 35% Formatting of this note might be different from the original. revenkar EF 35%     Acute on chronic systolic CHF (congestive heart failure) (HCC) 08/12/2023   Anasarca 07/01/2023   Anemia of chronic disease 08/18/2019   Anemia, chronic disease 08/18/2019   Aortic atherosclerosis (HCC)    Aortic stenosis    a.) TTE 09/15/2018: mild-mod (MPG 19); b.) TTE 03/21/2021: mild-mod (MPG 14.3); c.) TTE 04/24/2022: mild- mod (MPG 15)   Aortic stenosis, moderate 10/15/2019   Appendicitis 09/08/2018   ARF (acute renal failure) (HCC) 02/19/2020   Asthma    Atherosclerosis of native arteries of the extremities with ulceration (HCC) 02/24/2018   Formatting of this note might be different from the original.  Last Assessment & Plan:   His ABIs today are 1.07 on the right and 0.99 on the left with biphasic waveforms.  Although these pressures may be somewhat elevated from medial calcification, his flow does appear to be reasonably good.  His left ABI was 0.58 prior to intervention.  At this point, we will stretch out his follow-up and see hi   Benign hypertension with CKD (chronic kidney disease) stage IV (HCC) 02/24/2018   Benign prostatic hyperplasia without lower urinary tract symptoms 01/14/2018   Bilateral lower  extremity edema 11/04/2018   Bradycardia 03/25/2018   Bruit of right carotid artery 07/26/2015   CAD (coronary artery disease) 2003   a.) s/p 4v CABG 2003   Cardiac murmur 07/26/2015   Choledocholithiasis 11/12/2023   Chronic combined systolic and diastolic CHF (congestive heart failure) (HCC) 07/26/2015   a.) TTE 09/15/2018: EF  50-55%, mod LVH, RVE, BAE, mild-mod TR, AoV sclerosis, G1DD; b.) MPI 10/27/2019: EF <30%; c.) TTE 03/21/2021: EF 35-40%, post AK, inf HK, mod LVH, mod red RVSF, mod LAE, mod Aov sclerosis, G2DD; c.) TTE 04/24/2022: EF 35-40%, post AK, glok HK, mod LVE, mod red RVSF, mild-mod MR, Aov sclerosis, G3DD   Chronic idiopathic constipation 11/02/2018   Chronic low back pain without sciatica 12/02/2018   Chronic pain of right hip 03/31/2020   Chronic systolic heart failure (HCC) 07/26/2015   Formatting of this note might be different from the original. revenkar EF 35%     CKD (chronic kidney disease) stage 4, GFR 15-29 ml/min (HCC) 10/07/2018   Congestive heart failure (HCC) 07/26/2015   Last Assessment & Plan:   Poor cardiac function could certainly be contributing to poor blood supply to the feet and toes as well.     Formatting of this note might be different from the original.  Formatting of this note might be different from the original.  Last Assessment & Plan:   Poor cardiac function could certainly be contributing to poor blood supply to the feet and toes as well.     Last   Coronary artery disease    Diabetes mellitus without complication (HCC)    Dysphagia 07/26/2015   Encounter for long-term (current) use of insulin  (HCC) 09/27/2020   Erectile dysfunction 07/14/2017   ESRD on dialysis [facility]) 11/15/2023   Essential hypertension 02/24/2018   Fever 11/07/2020   Foot ulcer (HCC) 09/27/2020   left foot  Formatting of this note might be different from the original.  Formatting of this note might be different from the original.  left foot     Foot ulcer, left (HCC)    GERD (gastroesophageal reflux disease)    Heart murmur 07/26/2015   Hematuria 07/27/2015   High risk medication use 07/26/2015   History of marijuana use    Hyperkalemia 05/29/2020   Hyperlipidemia 07/26/2015   Hyperlipidemia associated with type 2 diabetes mellitus (HCC) 02/18/2020   Hypertension    Hypertension associated with  diabetes (HCC)    Hypoglycemia 05/07/2019   Hypothyroidism 11/14/2015   Hypothyroidism (acquired) 11/14/2015   Insomnia 11/02/2018   Kidney insufficiency    Lumbar spondylosis 03/20/2020   Malaise and fatigue 03/25/2018   Malnutrition of moderate degree 08/20/2023   Mitral regurgitation 05/12/2019   Moderate aortic regurgitation 05/12/2019   Myocardial infarction due to demand ischemia (HCC) 09/26/2020   a.) Type II NSTEMI; b.) troponins were trended 0.54 --> 0.56 --> 0.52 ng/mL   Non-compliance 05/05/2019   NSTEMI (non-ST elevated myocardial infarction) (HCC) 09/27/2020   Osseous and subluxation stenosis of intervertebral foramina of lumbar region 03/20/2020   Osteoarthritis 10/06/2019   Pancytopenia (HCC) 08/12/2023   Peripheral vascular disease (HCC)    Personal history of tobacco use, presenting hazards to health 09/27/2020   Polyneuropathy in diabetes (HCC) 05/07/2019   Primary osteoarthritis involving multiple joints 10/06/2019   Primary osteoarthritis of right hip 02/21/2020   Pulmonary HTN (HCC)    a.) TTE 05/12/2019: PASP 71; b.) TTE 09/27/2020: PASP >70; c.) TTE 03/21/2021: RVSP 43; d.) TTE 04/24/2022: RVSP 80.6  Puncture wound of right hip 04/02/2018   PVD (peripheral vascular disease) with claudication (HCC)    a.) s/p PTA 03/02/2018 - balloon angioplasty LEFT below knee popliteal artery; b.) s/p PTA 09/30/2022: baloon angioplasty LEFT tibioperoneal trunck, most proximal peroneal artery, and LEFT popliteal artery.   Regional wall motion abnormality of heart 05/07/2019   Right groin pain 02/18/2020   S/P CABG x 4 2003   Screening for colon cancer 02/18/2018   Sepsis (HCC) 11/15/2023   Sleep apnea 07/26/2015   a.) does not require nocturnal PAP therapy   Subacute osteomyelitis of left foot (HCC) 03/25/2018   Thrombocytopenia (HCC)    Type 2 diabetes mellitus with hyperlipidemia (HCC) 07/26/2015   Type 2 diabetes mellitus with stage 4 chronic kidney disease, with  long-term current use of insulin  (HCC) 07/26/2015   Type II diabetes mellitus with neurological manifestations (HCC) 02/18/2020   Vitamin B12 deficiency 06/16/2019   Vitamin D deficiency 05/07/2018    Family History  Problem Relation Age of Onset   Hyperlipidemia Mother    Hypertension Mother    Diabetes Mother    Heart attack Brother    Heart disease Brother    Lung disease Father     Physical Exam: Vitals:   01/15/24 0810 01/15/24 0900 01/15/24 1000 01/15/24 1100  BP: 123/65 108/60 (!) 92/55 113/68  Pulse: 73 69 64 66  Resp: 18     Temp: 98.4 F (36.9 C)     TempSrc: Oral     SpO2: 100% 94% 95% 100%  Weight:      Height:         General exam: Appears calm and comfortable  Respiratory system: Clear to auscultation Cardiovascular system: S1 & S2 heard, RRR.  Abd: nondistended, soft and nontender.Normal bowel sounds heard. Central nervous system: Alert and oriented. No focal neurological deficits. Extremities: Left foot with dressing Skin: No rashes Psychiatry:  Mood & affect appropriate. Dialysis Access: L AVF - previously had TDC removal. Still has dressing in place.   No Known Allergies Prior to Admission medications   Medication Sig Start Date End Date Taking? Authorizing Provider  acetaminophen  (TYLENOL ) 500 MG tablet Take 1,000 mg by mouth every 6 (six) hours as needed for moderate pain or headache.    Yes [provider]  aspirin  EC 81 MG tablet Take 1 tablet (81 mg total) by mouth daily. Swallow whole. 07/25/23  Yes Thukkani, Arun K, MD  atorvastatin  (LIPITOR) 20 MG tablet Take 1 tablet (20 mg total) by mouth daily. 07/25/23  Yes Thukkani, Arun K, MD  carvedilol  (COREG ) 3.125 MG tablet Take 1 tablet (3.125 mg total) by mouth 2 (two) times daily. 05/20/23 05/02/24 Yes Carlin Delon BROCKS, NP  clopidogrel  (PLAVIX ) 75 MG tablet Take 1 tablet (75 mg total) by mouth daily. 12/26/23  Yes Dew, Selinda RAMAN, MD  gabapentin  (NEURONTIN ) 300 MG capsule Take 1 capsule (300 mg  total) by mouth at bedtime. Patient taking differently: Take 600 mg by mouth 2 (two) times daily. 08/25/23  Yes Arrien, Mauricio Daniel, MD  insulin  glargine (LANTUS ) 100 UNIT/ML injection Inject 7 Units into the skin daily as needed (Diabetes). Inject 7 units at bedtime as needed after checking glucose   Yes [provider]  isosorbide  mononitrate (IMDUR ) 120 MG 24 hr tablet Take 1 tablet (120 mg total) by mouth daily. 08/27/23  Yes Revankar, Jennifer SAUNDERS, MD  latanoprost  (XALATAN ) 0.005 % ophthalmic solution Place 1 drop into both eyes at bedtime. 04/15/23  Yes [provider]  levothyroxine  (SYNTHROID ) 50 MCG tablet Take 50 mcg by mouth daily. 04/05/23  Yes [provider]  Methoxy PEG-Epoetin Beta (MIRCERA IJ) 30 mcg. 12/16/23 12/14/24 Yes [provider]  nitroGLYCERIN  (NITROSTAT ) 0.4 MG SL tablet Place 1 tablet (0.4 mg total) under the tongue every 5 (five) minutes as needed for chest pain. 11/20/20  Yes Revankar, Rajan R, MD  sevelamer  carbonate (RENVELA ) 800 MG tablet Take 800 mg by mouth 3 (three) times daily with meals. 09/04/23 11/29/24 Yes [provider]  tamsulosin  (FLOMAX ) 0.4 MG CAPS capsule Take 0.4 mg by mouth daily after breakfast. 04/19/20  Yes [provider]  traMADol  (ULTRAM ) 50 MG tablet Take 50 mg by mouth every 8 (eight) hours as needed. 09/15/23  Yes [provider]  sodium chloride  flush (NS) 0.9 % SOLN Inject 5 mLs into the vein daily for 15 days. flush drain daily with 5 cc NS, record output daily, dressing changes every 2-3 days or earlier if soiled 11/14/23 12/15/23  Darci Pore, MD  sodium chloride  flush 0.9 % SOLN injection 10 mLs by Intracatheter route every 3 (three) days. 01/02/24 04/01/24  Covington, Jamie R, NP   Current Facility-Administered Medications  Medication Dose Route Frequency Provider Last Rate Last Admin   acetaminophen  (TYLENOL ) tablet 650 mg  650 mg Oral Q6H PRN Raenelle Donalda HERO, MD   650 mg  at 01/15/24 9462   Or   acetaminophen  (TYLENOL ) suppository 650 mg  650 mg Rectal Q6H PRN Raenelle Donalda HERO, MD       albuterol  (PROVENTIL ) (2.5 MG/3ML) 0.083% nebulizer solution 2.5 mg  2.5 mg Nebulization Q2H PRN Raenelle Donalda HERO, MD       aspirin  EC tablet 81 mg  81 mg Oral Daily Ghimire, Shanker M, MD   81 mg at 01/15/24 0843   atorvastatin  (LIPITOR) tablet 20 mg  20 mg Oral Daily Ghimire, Shanker M, MD   20 mg at 01/15/24 0841   carvedilol  (COREG ) tablet 3.125 mg  3.125 mg Oral BID Raenelle Donalda HERO, MD   3.125 mg at 01/14/24 2056   Chlorhexidine  Gluconate Cloth 2 % PADS 6 each  6 each Topical Q0600 Rayburn Pac, MD   6 each at 01/15/24 0843   clopidogrel  (PLAVIX ) tablet 75 mg  75 mg Oral Daily Ghimire, Shanker M, MD   75 mg at 01/15/24 0842   heparin  injection 5,000 Units  5,000 Units Subcutaneous Q8H Raenelle Donalda HERO, MD   5,000 Units at 01/15/24 9156   HYDROmorphone  (DILAUDID ) injection 0.5 mg  0.5 mg Intravenous Q4H PRN Raenelle Donalda HERO, MD       insulin  aspart (novoLOG ) injection 0-6 Units  0-6 Units Subcutaneous TID WC Ghimire, Donalda HERO, MD       isosorbide  mononitrate (IMDUR ) 24 hr tablet 120 mg  120 mg Oral Daily Ghimire, Shanker M, MD   120 mg at 01/15/24 9157   levothyroxine  (SYNTHROID ) tablet 50 mcg  50 mcg Oral Daily Raenelle Donalda HERO, MD   50 mcg at 01/15/24 0537   nitroGLYCERIN  (NITROSTAT ) SL tablet 0.4 mg  0.4 mg Sublingual Q5 min PRN Ghimire, Donalda HERO, MD       oxyCODONE  (Oxy IR/ROXICODONE ) immediate release tablet 5 mg  5 mg Oral Q4H PRN Ghimire, Donalda HERO, MD       polyethylene glycol (MIRALAX  / GLYCOLAX ) packet 17 g  17 g Oral Daily PRN Raenelle Donalda HERO, MD       senna (SENOKOT) tablet 8.6 mg  1 tablet Oral BID Raenelle Donalda HERO, MD   8.6 mg at 01/15/24 0843   sevelamer  carbonate (RENVELA ) tablet 800 mg  800 mg Oral TID WC Raenelle Donalda HERO, MD   800 mg at 01/15/24 9157   tamsulosin  (FLOMAX ) capsule 0.4 mg  0.4 mg Oral QPC breakfast Raenelle Donalda HERO,  MD   0.4 mg at 01/15/24 9157   Labs: Basic Metabolic Panel: Recent Labs  Lab 01/14/24 1135 01/14/24 1839 01/15/24 0338  NA 137  --  137  K 4.0  --  4.2  CL 96*  --  94*  CO2  --   --  31  GLUCOSE 112*  --  89  BUN 29*  --  38*  CREATININE 6.10* 6.37* 7.32*  CALCIUM   --   --  8.6*   Liver Function Tests: Recent Labs  Lab 01/15/24 0338  AST 19  ALT 11  ALKPHOS 142*  BILITOT 0.8  PROT 7.6  ALBUMIN  2.1*   No results for input(s): LIPASE, AMYLASE in the last 168 hours. No results for input(s): AMMONIA in the last 168 hours. CBC: Recent Labs  Lab 01/14/24 1135 01/14/24 1839 01/15/24 0338  WBC  --  10.7* 8.6  HGB 11.6* 10.1* 10.2*  HCT 34.0* 32.0* 32.1*  MCV  --  97.0 96.1  PLT  --  192 182   CBG: Recent Labs  Lab 01/14/24 1402 01/14/24 1629 01/14/24 2123 01/15/24 0618 01/15/24 0812  GLUCAP 100* 98 165* 96 115*   Dialysis Orders: [name] - No heparin  - Left AVF (in use for 2 weeks) - EDW 81 kg 4 hr 350/800 2K/2.5 Ca Bicarb 39 Mircera: 50 mcg ordered - but has not received any ESA yet No VDRA Renvela  800 mg TID WC  Assessment/Plan:  Left third toe gangrene: 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 It was reported that the patient no bleeding in surgery which is suggestive of poor healing. Patient deferred a BKA or TMA yesterday. He wants to discuss this further with his wife before making a decision. High probability that he will need a proximal amputation in the near future.  PAD s/p angioplasty/PCI to left tibioperoneal trunk on 7/25: see above note  ESRD:  patient is compliant with his home HD per his clinic. I called clinic earlier and obtained him home HD orders. HD today. Next HD 01/17/24 to keep him on his home schedule.   Hypertension/volume: BP on the lower side today. Okay to give albumin  if needed during HD. Will add midodrine if he needs it. He is 2.3 kg above his EDW today.  Chronic HFrEF: he appears to be euvolemic today.  Continue volume removal with UF. Continue beta blocker/Imdur  per primary.  Severe Aortic Stenosis: patient is followed by the structural heart team. Planned TAVR, but was delayed due to his necrotic toe. Due to his lack of bleeding, this may postpone his TAVR Recent acute cholecystitis requiring cholecystostomy drain 6/25: drain is in place.   Anemia: His home clinic had ordered ESA, but had not given it yet. Hgb 10.2 today. We will monitor.   Metabolic bone disease: restarted home Sevelamer  800 TID WC. Corrected Ca 10.1 today. CTM. Not on VDRA at his OP clinic.   Nutrition:  Albumin  2.1. Push protein. On heart healthy diet with fluid restriction.   Belvie Och, NP 01/15/2024, 11:57 AM  [name]  I have seen and examined this patient and agree with plan and assessment in the  above note with renal recommendations/intervention highlighted.  Pt seen while in dialysis.  Ongoing discussions regarding his PAD and need to TMA or BKA but wants to discuss with his wife.  Fairy LABOR France Noyce,MD 01/15/2024 12:31 PM

## 2024-01-15 NOTE — Anesthesia Postprocedure Evaluation (Signed)
 Anesthesia Post Note  Patient: [name]  Procedure(s) Performed: AMPUTATION, FOOT, PARTIAL (Left: Foot)     Patient location during evaluation: PACU Anesthesia Type: Regional and MAC Level of consciousness: awake and alert Pain management: pain level controlled Vital Signs Assessment: post-procedure vital signs reviewed and stable Respiratory status: spontaneous breathing, nonlabored ventilation, respiratory function stable and patient connected to nasal cannula oxygen Cardiovascular status: stable and blood pressure returned to baseline Postop Assessment: no apparent nausea or vomiting Anesthetic complications: no   No notable events documented.  Last Vitals:  Vitals:   01/14/24 2318 01/15/24 0324  BP: (!) 104/57 118/70  Pulse: 63 (!) 57  Resp: 20 20  Temp: 36.9 C (!) 36.4 C  SpO2: 100% 100%    Last Pain:  Vitals:   01/15/24 0537  TempSrc:   PainSc: 7                  Amman Bartel P Jacquese Cassarino

## 2024-01-15 NOTE — Progress Notes (Addendum)
 Progress Note    01/15/2024 10:55 AM 1 Day Post-Op  Subjective: Says that his left foot feels sore    Vitals:   01/15/24 0900 01/15/24 1000  BP: 108/60 (!) 92/55  Pulse: 69 64  Resp:    Temp:    SpO2: 94% 95%    Physical Exam: General: Laying in bed, NAD Cardiac: Regular Lungs: Nonlabored Extremities: Open left third toe amputation packed with Betadine  gauze and dry dressing.  Stable tissue loss of the left fourth toe   CBC    Component Value Date/Time   WBC 8.6 01/15/2024 0338   RBC 3.34 (L) 01/15/2024 0338   HGB 10.2 (L) 01/15/2024 0338   HGB 11.5 (L) 05/16/2023 1429   HCT 32.1 (L) 01/15/2024 0338   HCT 35.3 (L) 05/16/2023 1429   PLT 182 01/15/2024 0338   PLT 144 (L) 05/16/2023 1429   MCV 96.1 01/15/2024 0338   MCV 90 05/16/2023 1429   MCH 30.5 01/15/2024 0338   MCHC 31.8 01/15/2024 0338   RDW 15.0 01/15/2024 0338   RDW 13.5 05/16/2023 1429   LYMPHSABS 0.7 11/11/2023 1930   LYMPHSABS 0.7 05/16/2023 1429   MONOABS 0.8 11/11/2023 1930   EOSABS 0.0 11/11/2023 1930   EOSABS 0.1 05/16/2023 1429   BASOSABS 0.0 11/11/2023 1930   BASOSABS 0.0 05/16/2023 1429    BMET    Component Value Date/Time   NA 137 01/15/2024 0338   NA 145 (H) 07/14/2023 1412   NA 136 08/30/2013 1025   K 4.2 01/15/2024 0338   K 3.7 08/30/2013 1025   CL 94 (L) 01/15/2024 0338   CL 101 08/30/2013 1025   CO2 31 01/15/2024 0338   CO2 31 08/30/2013 1025   GLUCOSE 89 01/15/2024 0338   GLUCOSE 310 (H) 08/30/2013 1025   BUN 38 (H) 01/15/2024 0338   BUN 97 (HH) 07/14/2023 1412   BUN 17 08/30/2013 1025   CREATININE 7.32 (H) 01/15/2024 0338   CREATININE 1.15 08/30/2013 1025   CALCIUM  8.6 (L) 01/15/2024 0338   CALCIUM  9.4 08/30/2013 1025   GFRNONAA 8 (L) 01/15/2024 0338   GFRNONAA >60 08/30/2013 1025   GFRAA 27 (L) 02/22/2020 0539   GFRAA >60 08/30/2013 1025    INR    Component Value Date/Time   INR 1.3 (H) 11/12/2023 1011     Intake/Output Summary (Last 24 hours) at  01/15/2024 1055 Last data filed at 01/14/2024 1621 Gross per 24 hour  Intake 450 ml  Output 3 ml  Net 447 ml      Assessment/Plan:  68 y.o. male is 1 day postop, s/p: Open left third toe amputation   - He is doing okay this morning.  He reports soreness in his left foot -He underwent left open third toe amputation yesterday.  During surgery he had no bleeding, suggestive of poor future healing -On exam his amputation site is dressed and dry.  We can continue Betadine  soaked gauze dressings to the open amputation site -I have briefly discussed with the patient that he has very low chances of healing his amputation site and will very likely require at least a left TMA or BKA in order to heal.  He said he would like to discuss this with his wife. -Further surgeries pending patient decision making.  Dr. Serene has attempted to call the patient's wife several times to discuss surgical options   Ahmed Elna [facility] [phone] 01/15/2024 10:55 AM  I agree with the above.  I have  seen and evaluated the patient.  His wife is at the bedside.  I discussed the findings at surgery included no bleeding from the amputation site.  His toe amputation site is not going to heal due to lack of blood flow despite being maximally revascularized.  The next step is to proceed with transmetatarsal amputation.  I discussed approximately a 40% chance that this would not heal and he would need a more proximal amputation.  He wants to go ahead and get this done.  I am placing him on the operating room schedule tomorrow for a left TMA.  He will be n.p.o. after midnight.  [name]

## 2024-01-15 NOTE — Progress Notes (Signed)
 Pt receives out-pt HD at [facility] on TTS 10:40 am chair time. Will assist as needed.   Randine Mungo Dialysis Navigator [phone]

## 2024-01-15 NOTE — Progress Notes (Addendum)
 PROGRESS NOTE    Phillip [name]  FMW:[id number] DOB: 11-21-1955 DOA: 01/14/2024 PCP: [name] LABOR., MD  67/M w ESRD on HD TTS, DM-1, recent acute cholecystitis June 2025) requiring cholecystostomy tube placement, chronic HFrEF, aortic stenosis (TAVR postponed due to left third toe gangrene), PAD-s/p PCI to left tibia-fibula/angioplasty on 7/25 who was admitted by vascular surgery 8/13 for an elective left third toe amputation.  H/o significant issues with nonhealing ulceration of his left foot-progressing into a gangrenous left third toe.   Noninvasive vascular studies showed noncompressible vessels-hence he underwent a angiogram of LLE with transluminal angioplasty/stent placement to the left tibioperoneal trunk on 7/25 at [facility].  Plans were to proceed with a left third toe amputation post revascularization.  However due to his significant underlying medical comorbidities, this could not be done at [facility], so he was referred to the [facility] surgery service. - 8/13 underwent left third toe amputation including metatarsal head, unfortunately bone was not healthy, no capillary bleeding at this level, patient felt to require TMA or BKA however patient declined this. - Vascular requested hospitalist admission  Subjective: -Feels fair, denies significant pain or other concerns  Assessment and Plan:  Left third toe gangrene S/p amputation of left third toe 8/13, unfortunately had no bleeding during surgery suggestive of poor future healing, he deferred BKA or TMA yesterday - Continue dressing changes, discussed limitations of and high likelihood of needing proximal amputation with patient, he will discuss further with his wife  He PAD s/p angioplasty/PCI to left tibioperoneal trunk on 7/25 See above regarding left third toe tissue. Continue aspirin /Plavix /statin.   ESRD on HD TTS -Nephrology following, HD today Per chart review-patient was tunneled HD catheter was recently removed on 8/6, he  currently has a functioning LUE AVF   Normocytic anemia - Secondary to CKD, stable monitor   Chronic HFrEF Euvolemic Last echo 1/25 at OSH-EF 30-35% SEVERE LFLG Volume removal with hemodialysis Continue beta-blocker/Imdur .   Severe aortic stenosis -Followed by structural heart team,-plans are for TAVR once necrotic toe issues have been dealt with. -Unfortunately nonhealing open wound as above will affect his eligibility for TAVR   History of CAD s/p CABG 2003 No anginal symptoms -last cath 2/25: Severe CAD w/ all vein grafts occluded On aspirin /Plavix /statin.   Recent history of acute cholecystitis requiring cholecystostomy drain June 2025 Cholecystostomy drain remains in place Routine drain care Reviewed outpatient general surgery note-definite surgery to be done post TAVR   DM-2 Placing on SSI Follow CBGs and optimize accordingly.   Hypothyroidism Synthroid      DVT prophylaxis: Heparin  subcutaneous Code Status: Full code Family Communication: None present Disposition Plan: May need SNF    Objective: Vitals:   01/15/24 0810 01/15/24 0900 01/15/24 1000 01/15/24 1100  BP: 123/65 108/60 (!) 92/55 113/68  Pulse: 73 69 64 66  Resp: 18     Temp: 98.4 F (36.9 C)     TempSrc: Oral     SpO2: 100% 94% 95% 100%  Weight:      Height:        Intake/Output Summary (Last 24 hours) at 01/15/2024 1134 Last data filed at 01/14/2024 1621 Gross per 24 hour  Intake 450 ml  Output 3 ml  Net 447 ml   Filed Weights   01/13/24 1034 01/14/24 1107  Weight: 82.2 kg 83.5 kg    Examination:  General exam: Appears calm and comfortable  Respiratory system: Clear to auscultation Cardiovascular system: S1 & S2 heard, RRR.  Abd: nondistended, soft  and nontender.Normal bowel sounds heard. Central nervous system: Alert and oriented. No focal neurological deficits. Extremities: Left foot with dressing Skin: No rashes Psychiatry:  Mood & affect appropriate.     Data  Reviewed:   CBC: Recent Labs  Lab 01/14/24 1135 01/14/24 1839 01/15/24 0338  WBC  --  10.7* 8.6  HGB 11.6* 10.1* 10.2*  HCT 34.0* 32.0* 32.1*  MCV  --  97.0 96.1  PLT  --  192 182   Basic Metabolic Panel: Recent Labs  Lab 01/14/24 1135 01/14/24 1839 01/15/24 0338  NA 137  --  137  K 4.0  --  4.2  CL 96*  --  94*  CO2  --   --  31  GLUCOSE 112*  --  89  BUN 29*  --  38*  CREATININE 6.10* 6.37* 7.32*  CALCIUM   --   --  8.6*   GFR: Estimated Creatinine Clearance: 10.6 mL/min (A) (by C-G formula based on SCr of 7.32 mg/dL (H)). Liver Function Tests: Recent Labs  Lab 01/15/24 0338  AST 19  ALT 11  ALKPHOS 142*  BILITOT 0.8  PROT 7.6  ALBUMIN  2.1*   No results for input(s): LIPASE, AMYLASE in the last 168 hours. No results for input(s): AMMONIA in the last 168 hours. Coagulation Profile: No results for input(s): INR, PROTIME in the last 168 hours. Cardiac Enzymes: No results for input(s): CKTOTAL, CKMB, CKMBINDEX, TROPONINI in the last 168 hours. BNP (last 3 results) Recent Labs    07/14/23 1412  PROBNP 11,269*   HbA1C: No results for input(s): HGBA1C in the last 72 hours. CBG: Recent Labs  Lab 01/14/24 1402 01/14/24 1629 01/14/24 2123 01/15/24 0618 01/15/24 0812  GLUCAP 100* 98 165* 96 115*   Lipid Profile: No results for input(s): CHOL, HDL, LDLCALC, TRIG, CHOLHDL, LDLDIRECT in the last 72 hours. Thyroid  Function Tests: No results for input(s): TSH, T4TOTAL, FREET4, T3FREE, THYROIDAB in the last 72 hours. Anemia Panel: No results for input(s): VITAMINB12, FOLATE, FERRITIN, TIBC, IRON , RETICCTPCT in the last 72 hours. Urine analysis:    Component Value Date/Time   COLORURINE YELLOW 11/07/2020 0744   APPEARANCEUR CLEAR 11/07/2020 0744   LABSPEC 1.011 11/07/2020 0744   PHURINE 5.0 11/07/2020 0744   GLUCOSEU 50 (A) 11/07/2020 0744   HGBUR SMALL (A) 11/07/2020 0744   BILIRUBINUR NEGATIVE  11/07/2020 0744   KETONESUR NEGATIVE 11/07/2020 0744   PROTEINUR 100 (A) 11/07/2020 0744   NITRITE NEGATIVE 11/07/2020 0744   LEUKOCYTESUR NEGATIVE 11/07/2020 0744   Sepsis Labs: @LABRCNTIP (procalcitonin:4,lacticidven:4)  )No results found for this or any previous visit (from the past 240 hours).   Radiology Studies: No results found.   Scheduled Meds:  aspirin  EC  81 mg Oral Daily   atorvastatin   20 mg Oral Daily   carvedilol   3.125 mg Oral BID   Chlorhexidine  Gluconate Cloth  6 each Topical Q0600   clopidogrel   75 mg Oral Daily   heparin   5,000 Units Subcutaneous Q8H   insulin  aspart  0-6 Units Subcutaneous TID WC   isosorbide  mononitrate  120 mg Oral Daily   levothyroxine   50 mcg Oral Daily   senna  1 tablet Oral BID   sevelamer  carbonate  800 mg Oral TID WC   tamsulosin   0.4 mg Oral QPC breakfast   Continuous Infusions:   LOS: 1 day    Time spent:    Sigurd Pac, MD [facility]   01/15/2024, 11:34 AM

## 2024-01-16 ENCOUNTER — Inpatient Hospital Stay (HOSPITAL_COMMUNITY): Admitting: Anesthesiology

## 2024-01-16 ENCOUNTER — Encounter (HOSPITAL_COMMUNITY)
Admission: RE | Disposition: A | Discharge status: Rehabilitation Facility | Payer: Self-pay | Source: Home / Self Care | Attending: Internal Medicine

## 2024-01-16 ENCOUNTER — Encounter (HOSPITAL_COMMUNITY): Payer: Self-pay | Admitting: Internal Medicine

## 2024-01-16 ENCOUNTER — Other Ambulatory Visit: Payer: Self-pay

## 2024-01-16 DIAGNOSIS — I5023 Acute on chronic systolic (congestive) heart failure: Secondary | ICD-10-CM | POA: Diagnosis not present

## 2024-01-16 DIAGNOSIS — I2510 Atherosclerotic heart disease of native coronary artery without angina pectoris: Secondary | ICD-10-CM

## 2024-01-16 DIAGNOSIS — T8789 Other complications of amputation stump: Secondary | ICD-10-CM

## 2024-01-16 DIAGNOSIS — I96 Gangrene, not elsewhere classified: Secondary | ICD-10-CM | POA: Diagnosis not present

## 2024-01-16 DIAGNOSIS — I110 Hypertensive heart disease with heart failure: Secondary | ICD-10-CM | POA: Diagnosis not present

## 2024-01-16 DIAGNOSIS — Z89432 Acquired absence of left foot: Secondary | ICD-10-CM | POA: Diagnosis not present

## 2024-01-16 DIAGNOSIS — S91132A Puncture wound without foreign body of left great toe without damage to nail, initial encounter: Secondary | ICD-10-CM | POA: Diagnosis not present

## 2024-01-16 DIAGNOSIS — Z89422 Acquired absence of other left toe(s): Secondary | ICD-10-CM | POA: Diagnosis not present

## 2024-01-16 HISTORY — PX: TRANSMETATARSAL AMPUTATION: SHX6197

## 2024-01-16 LAB — BASIC METABOLIC PANEL WITH GFR
Anion gap: 14 (ref 5–15)
BUN: 25 mg/dL — ABNORMAL HIGH (ref 8–23)
CO2: 28 mmol/L (ref 22–32)
Calcium: 8.5 mg/dL — ABNORMAL LOW (ref 8.9–10.3)
Chloride: 94 mmol/L — ABNORMAL LOW (ref 98–111)
Creatinine, Ser: 5.21 mg/dL — ABNORMAL HIGH (ref 0.61–1.24)
GFR, Estimated: 11 mL/min — ABNORMAL LOW (ref >60)
Glucose, Bld: 97 mg/dL (ref 70–99)
Potassium: 3.8 mmol/L (ref 3.5–5.1)
Sodium: 136 mmol/L (ref 135–145)

## 2024-01-16 LAB — GLUCOSE, CAPILLARY
Glucose-Capillary: 101 mg/dL — ABNORMAL HIGH (ref 70–99)
Glucose-Capillary: 95 mg/dL (ref 70–99)
Glucose-Capillary: 99 mg/dL (ref 70–99)
Glucose-Capillary: 98 mg/dL (ref 70–99)
Glucose-Capillary: 95 mg/dL (ref 70–99)
Glucose-Capillary: 99 mg/dL (ref 70–99)
Glucose-Capillary: 123 mg/dL — ABNORMAL HIGH (ref 70–99)

## 2024-01-16 LAB — SURGICAL PCR SCREEN
MRSA, PCR: NEGATIVE
Staphylococcus aureus: NEGATIVE

## 2024-01-16 LAB — CBC
HCT: 33.7 % — ABNORMAL LOW (ref 39.0–52.0)
Hemoglobin: 10.7 g/dL — ABNORMAL LOW (ref 13.0–17.0)
MCH: 30.4 pg (ref 26.0–34.0)
MCHC: 31.8 g/dL (ref 30.0–36.0)
MCV: 95.7 fL (ref 80.0–100.0)
Platelets: 214 K/uL (ref 150–400)
RBC: 3.52 MIL/uL — ABNORMAL LOW (ref 4.22–5.81)
RDW: 15.1 % (ref 11.5–15.5)
WBC: 12.4 K/uL — ABNORMAL HIGH (ref 4.0–10.5)
nRBC: 0 % (ref 0.0–0.2)

## 2024-01-16 LAB — HEPATITIS B SURFACE ANTIBODY, QUANTITATIVE: Hep B S AB Quant (Post): 40.6 m[IU]/mL

## 2024-01-16 SURGERY — AMPUTATION, FOOT, TRANSMETATARSAL
Anesthesia: Monitor Anesthesia Care | Site: Toe | Laterality: Left

## 2024-01-16 MED ORDER — CHLORHEXIDINE GLUCONATE 0.12 % MT SOLN
15.0000 mL | Freq: Once | OROMUCOSAL | Status: AC
  Administered 2024-01-16: 15 mL via OROMUCOSAL

## 2024-01-16 MED ORDER — HEPARIN SODIUM (PORCINE) 5000 UNIT/ML IJ SOLN
5000.0000 [IU] | Freq: Three times a day (TID) | INTRAMUSCULAR | Status: DC
  Administered 2024-01-17 – 2024-02-01 (×34): 5000 [IU] via SUBCUTANEOUS
  Filled 2024-01-16 (×36): qty 1

## 2024-01-16 MED ORDER — CHLORHEXIDINE GLUCONATE CLOTH 2 % EX PADS
6.0000 | MEDICATED_PAD | Freq: Every day | CUTANEOUS | Status: DC
  Administered 2024-01-17 – 2024-01-18 (×2): 6 via TOPICAL

## 2024-01-16 MED ORDER — CLOPIDOGREL BISULFATE 75 MG PO TABS
75.0000 mg | ORAL_TABLET | Freq: Every day | ORAL | Status: DC
  Administered 2024-01-17 – 2024-02-05 (×19): 75 mg via ORAL
  Filled 2024-01-16 (×19): qty 1

## 2024-01-16 MED ORDER — 0.9 % SODIUM CHLORIDE (POUR BTL) OPTIME
TOPICAL | Status: DC | PRN
  Administered 2024-01-16: 1000 mL

## 2024-01-16 MED ORDER — CEFAZOLIN SODIUM 1 G IJ SOLR
INTRAMUSCULAR | Status: AC
  Filled 2024-01-16: qty 20

## 2024-01-16 MED ORDER — PHENYLEPHRINE HCL-NACL 20-0.9 MG/250ML-% IV SOLN
INTRAVENOUS | Status: DC | PRN
  Administered 2024-01-16: 20 ug/min via INTRAVENOUS

## 2024-01-16 MED ORDER — CEFAZOLIN SODIUM-DEXTROSE 2-3 GM-%(50ML) IV SOLR
INTRAVENOUS | Status: DC | PRN
Start: 2024-01-16 — End: 2024-01-16
  Administered 2024-01-16: 2 g via INTRAVENOUS

## 2024-01-16 MED ORDER — PROPOFOL 500 MG/50ML IV EMUL
INTRAVENOUS | Status: DC | PRN
  Administered 2024-01-16: 75 ug/kg/min via INTRAVENOUS
  Administered 2024-01-16 (×2): 20 mg via INTRAVENOUS

## 2024-01-16 MED ORDER — CHLORHEXIDINE GLUCONATE 0.12 % MT SOLN
OROMUCOSAL | Status: AC
Start: 2024-01-16 — End: 2024-01-17
  Filled 2024-01-16: qty 15

## 2024-01-16 MED ORDER — ORAL CARE MOUTH RINSE: 15.0000 mL | Freq: Once | OROMUCOSAL | Status: AC

## 2024-01-16 MED ORDER — SODIUM CHLORIDE 0.9 % IV SOLN
INTRAVENOUS | Status: DC
Start: 2024-01-16 — End: 2024-01-16

## 2024-01-16 MED ORDER — FENTANYL CITRATE (PF) 100 MCG/2ML IJ SOLN: 100.0000 ug | Freq: Once | INTRAMUSCULAR | Status: AC

## 2024-01-16 MED ORDER — BUPIVACAINE-EPINEPHRINE (PF) 0.5% -1:200000 IJ SOLN
INTRAMUSCULAR | Status: DC | PRN
  Administered 2024-01-16: 30 mL via PERINEURAL

## 2024-01-16 MED ORDER — INSULIN ASPART 100 UNIT/ML IJ SOLN: 0.0000 [IU] | INTRAMUSCULAR | Status: DC | PRN

## 2024-01-16 MED ORDER — FENTANYL CITRATE (PF) 100 MCG/2ML IJ SOLN
INTRAMUSCULAR | Status: AC
  Administered 2024-01-16: 100 ug via INTRAVENOUS
  Filled 2024-01-16: qty 2

## 2024-01-16 SURGICAL SUPPLY — 37 items
BAG COUNTER SPONGE SURGICOUNT (BAG) ×1 IMPLANT
BLADE CORE FAN STRYKER (BLADE) IMPLANT
BNDG ELASTIC 4X5.8 VLCR STR LF (GAUZE/BANDAGES/DRESSINGS) ×1 IMPLANT
BNDG GAUZE DERMACEA FLUFF 4 (GAUZE/BANDAGES/DRESSINGS) ×1 IMPLANT
BNDG STRETCH GAUZE 3IN X12FT (GAUZE/BANDAGES/DRESSINGS) IMPLANT
CANISTER SUCTION 3000ML PPV (SUCTIONS) ×1 IMPLANT
CNTNR URN SCR LID CUP LEK RST (MISCELLANEOUS) ×1 IMPLANT
COVER SURGICAL LIGHT HANDLE (MISCELLANEOUS) ×1 IMPLANT
DRAPE EXTREMITY T 121X128X90 (DISPOSABLE) ×1 IMPLANT
DRAPE HALF SHEET 40X57 (DRAPES) ×1 IMPLANT
DRESSING PEEL AND PLC PRVNA 13 (GAUZE/BANDAGES/DRESSINGS) IMPLANT
ELECTRODE REM PT RTRN 9FT ADLT (ELECTROSURGICAL) ×1 IMPLANT
GAUZE SPONGE 4X4 12PLY STRL (GAUZE/BANDAGES/DRESSINGS) ×1 IMPLANT
GLOVE SURG SS PI 7.5 STRL IVOR (GLOVE) ×3 IMPLANT
GOWN STRL REUS W/ TWL LRG LVL3 (GOWN DISPOSABLE) ×2 IMPLANT
GOWN STRL REUS W/ TWL XL LVL3 (GOWN DISPOSABLE) ×1 IMPLANT
GRAFT SKIN WND MICRO 38 (Tissue) IMPLANT
KIT BASIN OR (CUSTOM PROCEDURE TRAY) ×1 IMPLANT
KIT DRSG PREVENA PLUS 7DAY 125 (MISCELLANEOUS) IMPLANT
KIT TURNOVER KIT B (KITS) ×1 IMPLANT
NDL HYPO 25GX1X1/2 BEV (NEEDLE) IMPLANT
NEEDLE HYPO 25GX1X1/2 BEV (NEEDLE) IMPLANT
NS IRRIG 1000ML POUR BTL (IV SOLUTION) ×1 IMPLANT
PACK GENERAL/GYN (CUSTOM PROCEDURE TRAY) ×1 IMPLANT
PAD ARMBOARD POSITIONER FOAM (MISCELLANEOUS) ×2 IMPLANT
SPECIMEN JAR SMALL (MISCELLANEOUS) ×1 IMPLANT
SUT ETHILON 1 LR 30 (SUTURE) IMPLANT
SUT ETHILON 2 0 PSLX (SUTURE) ×4 IMPLANT
SUT ETHILON 3 0 PS 1 (SUTURE) ×1 IMPLANT
SUT PROLENE 4-0 RB1 18X2 ARM (SUTURE) IMPLANT
SUT PROLENE 5 0 C 1 24 (SUTURE) IMPLANT
SUT VIC AB 3-0 SH 8-18 (SUTURE) IMPLANT
SYR CONTROL 10ML LL (SYRINGE) IMPLANT
TOWEL GREEN STERILE (TOWEL DISPOSABLE) ×2 IMPLANT
TOWEL GREEN STERILE FF (TOWEL DISPOSABLE) ×1 IMPLANT
UNDERPAD 30X36 HEAVY ABSORB (UNDERPADS AND DIAPERS) ×1 IMPLANT
WATER STERILE IRR 1000ML POUR (IV SOLUTION) ×1 IMPLANT

## 2024-01-16 NOTE — Anesthesia Postprocedure Evaluation (Signed)
 Anesthesia Post Note  Patient: Phillip [name]  Procedure(s) Performed: AMPUTATION, FOOT, TRANSMETATARSAL (Left: Toe)     Patient location during evaluation: PACU Anesthesia Type: Regional and MAC Level of consciousness: awake Pain management: pain level controlled Vital Signs Assessment: post-procedure vital signs reviewed and stable Respiratory status: spontaneous breathing, nonlabored ventilation, respiratory function stable and patient connected to nasal cannula oxygen Cardiovascular status: stable and blood pressure returned to baseline Postop Assessment: no apparent nausea or vomiting Anesthetic complications: no   No notable events documented.  Last Vitals:  Vitals:   01/16/24 1525 01/16/24 1705  BP: 131/82 (!) 101/56  Pulse: 83 80  Resp: 14   Temp:  36.5 C  SpO2: 100% 99%    Last Pain:  Vitals:   01/16/24 1705  TempSrc:   PainSc: 0-No pain                 [name],W. EDMOND

## 2024-01-16 NOTE — Anesthesia Preprocedure Evaluation (Signed)
 Anesthesia Evaluation  Patient identified by MRN, date of birth, ID band Patient awake    Reviewed: Allergy & Precautions, H&P , NPO status , Patient's Chart, lab work & pertinent test results, reviewed documented beta blocker date and time   Airway Mallampati: II  TM Distance: >3 FB Neck ROM: Full    Dental no notable dental hx. (+) Edentulous Upper, Edentulous Lower, Dental Advisory Given   Pulmonary asthma , sleep apnea , former smoker   Pulmonary exam normal breath sounds clear to auscultation       Cardiovascular hypertension, Pt. on medications and Pt. on home beta blockers + CAD, + Past MI, + Peripheral Vascular Disease and +CHF  + Valvular Problems/Murmurs AS and MR  Rhythm:Regular Rate:Normal     Neuro/Psych negative neurological ROS  negative psych ROS   GI/Hepatic Neg liver ROS,GERD  ,,  Endo/Other  diabetes, Insulin  DependentHypothyroidism    Renal/GU ESRF and DialysisRenal disease  negative genitourinary   Musculoskeletal  (+) Arthritis , Osteoarthritis,    Abdominal   Peds  Hematology  (+) Blood dyscrasia, anemia   Anesthesia Other Findings   Reproductive/Obstetrics negative OB ROS                              Anesthesia Physical Anesthesia Plan  ASA: 4  Anesthesia Plan: MAC and Regional   Post-op Pain Management: Minimal or no pain anticipated   Induction: Intravenous  PONV Risk Score and Plan: 1 and Propofol  infusion and Ondansetron   Airway Management Planned: Natural Airway and Simple Face Mask  Additional Equipment:   Intra-op Plan:   Post-operative Plan:   Informed Consent: I have reviewed the patients History and Physical, chart, labs and discussed the procedure including the risks, benefits and alternatives for the proposed anesthesia with the patient or authorized representative who has indicated his/her understanding and acceptance.     Dental  advisory given  Plan Discussed with: CRNA  Anesthesia Plan Comments:         Anesthesia Quick Evaluation

## 2024-01-16 NOTE — Interval H&P Note (Signed)
 History and Physical Interval Note:  01/16/2024 3:21 PM  Phillip [name]  has presented today for surgery, with the diagnosis of lack of blood flow left foot.  The various methods of treatment have been discussed with the patient and family. After consideration of risks, benefits and other options for treatment, the patient has consented to  Procedure(s): AMPUTATION, FOOT, TRANSMETATARSAL (Left) as a surgical intervention.  The patient's history has been reviewed, patient examined, no change in status, stable for surgery.  I have reviewed the patient's chart and labs.  Questions were answered to the patient's satisfaction.     [name]

## 2024-01-16 NOTE — Op Note (Signed)
    Patient name: Phillip [name] MRN: [id number] DOB: 1956/03/11 Sex: male  01/16/2024 Pre-operative Diagnosis: Nonhealing left toe wound Post-operative diagnosis:  Same Surgeon:  [name] Assistants:  [name], PA Procedure:   #1: Left transmetatarsal amputation   #2: Placement of first 38 cm skin substitute [name])   #3: Prevena wound VAC Anesthesia:  MAC and regional Blood Loss:  minimal Specimens:  left forefoot  Findings: Significantly improved bleeding at the amputation site.  No evidence of infection.  Bone was healthy  Indications: This is a 68 year old gentleman with a nonhealing left toe wound who comes in today for more proximal amputation.  I discussed TMA versus BKA.  He has decided to proceed with a transmetatarsal amputation.  Procedure:  The patient was identified in the holding area and taken to [facility] OR ROOM 12  The patient was then placed supine on the table. regional anesthesia was administered.  The patient was prepped and draped in the usual sterile fashion.  A time out was called and antibiotics were administered.  A PA was necessary to expedite the procedure and assist with technical details.  She helped with exposure by providing suction and retraction.  She helped with wound closure.  A fishmouth incision was made in the mid forefoot with extension of the posterior flap.  This was done with a 10 blade which was taken down to the bone circumferentially.  A periosteal elevator was used to elevate the periosteum.  A oscillating saw was used to transect the metatarsal bones.  A rasp was used to smooth the bone edges.  I debrided some of the tissue on the posterior flap.  There was adequate bleeding from the capillaries.  The wound was irrigated.  Hemostasis was achieved.  I placed 38 cm of Kerecis powder to enhance wound healing.  I then reapproximated the fascia with interrupted 3-0 Vicryl.  The skin was closed with 2 oh vertical mattress nylon sutures and staples.   A Prevena wound VAC was placed.  There were no complications.   Disposition: To PACU stable.   [name], M.D., [facility] [place] Office: [phone] Pager:  [phone]

## 2024-01-16 NOTE — Progress Notes (Addendum)
 Pt does not not want sign his consent for surgery until he speaks to his wife and MD in the am

## 2024-01-16 NOTE — Anesthesia Procedure Notes (Signed)
 Anesthesia Regional Block: Popliteal block   Pre-Anesthetic Checklist: , timeout performed,  Correct Patient, Correct Site, Correct Laterality,  Correct Procedure, Correct Position, site marked,  Risks and benefits discussed,  Pre-op  evaluation,  At surgeon's request and post-op pain management  Laterality: Left  Prep: Maximum Sterile Barrier Precautions used, chloraprep       Needles:  Injection technique: Single-shot  Needle Type: Echogenic Stimulator Needle     Needle Length: 9cm  Needle Gauge: 21     Additional Needles:   Procedures:,,,, ultrasound used (permanent image in chart),,    Narrative:  Start time: 01/16/2024 3:10 PM End time: 01/16/2024 3:20 PM Injection made incrementally with aspirations every 5 mL.  Performed by: Personally  Anesthesiologist: Epifanio Fallow, MD

## 2024-01-16 NOTE — Progress Notes (Signed)
 [facility] Progress Note   Subjective:   Patient seen and examined in his room this morning. Patient has consented for a TMA, which is scheduled for later today. Patient's family members were present during my visit. He reports that HD went well and they were able to yield 2L UF. Next HD 01/17/24  Objective Vitals:   01/16/24 0745 01/16/24 1154 01/16/24 1200 01/16/24 1239  BP: 118/68 (!) 85/51 (!) 88/61 104/88  Pulse: 84 86    Resp: (!) 27 (!) 24    Temp: 98.4 F (36.9 C) 98.5 F (36.9 C)    TempSrc: Oral Oral    SpO2:  96%    Weight:      Height:       General exam: Appears calm and comfortable  Respiratory system: Clear Cardiovascular system: S1 & S2 heard, RRR.  Abd: n soft nontender, bowel sounds present Central nervous system: Alert and oriented. No focal neurological deficits. Extremities: Left foot with dressing Skin: No rashes Psychiatry:  Mood & affect appropriate Dialysis access: L U AVF, +b/t  Additional Objective Labs: Basic Metabolic Panel: Recent Labs  Lab 01/14/24 1135 01/14/24 1839 01/15/24 0338 01/16/24 0343  NA 137  --  137 136  K 4.0  --  4.2 3.8  CL 96*  --  94* 94*  CO2  --   --  31 28  GLUCOSE 112*  --  89 97  BUN 29*  --  38* 25*  CREATININE 6.10* 6.37* 7.32* 5.21*  CALCIUM   --   --  8.6* 8.5*   Liver Function Tests: Recent Labs  Lab 01/15/24 0338  AST 19  ALT 11  ALKPHOS 142*  BILITOT 0.8  PROT 7.6  ALBUMIN  2.1*   No results for input(s): LIPASE, AMYLASE in the last 168 hours. CBC: Recent Labs  Lab 01/14/24 1839 01/15/24 0338 01/16/24 0343  WBC 10.7* 8.6 12.4*  HGB 10.1* 10.2* 10.7*  HCT 32.0* 32.1* 33.7*  MCV 97.0 96.1 95.7  PLT 192 182 214   Blood Culture    Component Value Date/Time   SDES BILE 11/12/2023 1247   SPECREQUEST NONE 11/12/2023 1247   CULT MODERATE STREPTOCOCCUS SALIVARIUS 11/12/2023 1247   REPTSTATUS 11/14/2023 FINAL 11/12/2023 1247     CBG: Recent Labs  Lab 01/15/24 1852  01/15/24 2117 01/16/24 0633 01/16/24 0748 01/16/24 1156  GLUCAP 117* 129* 101* 95 99   Medications:   aspirin  EC  81 mg Oral Daily   atorvastatin   20 mg Oral Daily   carvedilol   3.125 mg Oral BID   Chlorhexidine  Gluconate Cloth  6 each Topical Q0600   clopidogrel   75 mg Oral Daily   heparin   5,000 Units Subcutaneous Q8H   insulin  aspart  0-6 Units Subcutaneous TID WC   isosorbide  mononitrate  120 mg Oral Daily   levothyroxine   50 mcg Oral Daily   senna  1 tablet Oral BID   sevelamer  carbonate  800 mg Oral TID WC   tamsulosin   0.4 mg Oral QPC breakfast   Dialysis Orders: [facility] TTS - No heparin  - Left AVF (in use for 2 weeks) - EDW 81 kg 4 hr 350/800 2K/2.5 Ca Bicarb 39 Mircera: 50 mcg ordered - but has not received any ESA yet No VDRA Renvela  800 mg TID WC   Assessment/Plan:  Left third toe gangrene: 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 It was reported that the patient no bleeding in surgery which is suggestive of poor healing. Patient  deferred a BKA or TMA yesterday. He has consented for TMA today which is scheduled later today.  PAD s/p angioplasty/PCI to left tibioperoneal trunk on 7/25: see above note  ESRD:  patient is compliant with his home HD per his clinic. I called clinic earlier and obtained him home HD orders. HD today. Next HD 01/17/24 to keep him on his home schedule.   Hypertension/volume: BP on the lower side today. Okay to give albumin  if needed during HD. Will add midodrine if he needs it. He is 2.3 kg above his EDW today.  Chronic HFrEF: he appears to be euvolemic today. Continue volume removal with UF. Continue beta blocker/Imdur  per primary.  Severe Aortic Stenosis: patient is followed by the structural heart team. Planned TAVR, but was delayed due to his necrotic toe. Due to his lack of bleeding, this may postpone his TAVR Recent acute cholecystitis requiring cholecystostomy drain 6/25: drain is in place.   Anemia: His home clinic had ordered  ESA, but had not given it yet. Hgb 10.2 today. We will monitor.   Metabolic bone disease: restarted home Sevelamer  800 TID WC. Corrected Ca 10.1 today. CTM. Not on VDRA at his OP clinic.   Nutrition:  Albumin  2.1. Push protein. On heart healthy diet with fluid restriction   Belvie Och, NP 01/16/2024, 1:58 PM  [facility]

## 2024-01-16 NOTE — Progress Notes (Signed)
  Progress Note    01/16/2024 10:23 AM 2 Days Post-Op  Subjective: No complaints.  He says he has not talked to his wife about surgery yet    Vitals:   01/16/24 0318 01/16/24 0745  BP: 111/66 118/68  Pulse: 88 84  Resp: 19 (!) 27  Temp: 99 F (37.2 C) 98.4 F (36.9 C)  SpO2:      Physical Exam: General: Resting comfortably Cardiac: Regular Lungs: Nonlabored Extremities:  left open 3rd toe amputation dressed   CBC    Component Value Date/Time   WBC 12.4 (H) 01/16/2024 0343   RBC 3.52 (L) 01/16/2024 0343   HGB 10.7 (L) 01/16/2024 0343   HGB 11.5 (L) 05/16/2023 1429   HCT 33.7 (L) 01/16/2024 0343   HCT 35.3 (L) 05/16/2023 1429   PLT 214 01/16/2024 0343   PLT 144 (L) 05/16/2023 1429   MCV 95.7 01/16/2024 0343   MCV 90 05/16/2023 1429   MCH 30.4 01/16/2024 0343   MCHC 31.8 01/16/2024 0343   RDW 15.1 01/16/2024 0343   RDW 13.5 05/16/2023 1429   LYMPHSABS 0.7 11/11/2023 1930   LYMPHSABS 0.7 05/16/2023 1429   MONOABS 0.8 11/11/2023 1930   EOSABS 0.0 11/11/2023 1930   EOSABS 0.1 05/16/2023 1429   BASOSABS 0.0 11/11/2023 1930   BASOSABS 0.0 05/16/2023 1429    BMET    Component Value Date/Time   NA 136 01/16/2024 0343   NA 145 (H) 07/14/2023 1412   NA 136 08/30/2013 1025   K 3.8 01/16/2024 0343   K 3.7 08/30/2013 1025   CL 94 (L) 01/16/2024 0343   CL 101 08/30/2013 1025   CO2 28 01/16/2024 0343   CO2 31 08/30/2013 1025   GLUCOSE 97 01/16/2024 0343   GLUCOSE 310 (H) 08/30/2013 1025   BUN 25 (H) 01/16/2024 0343   BUN 97 (HH) 07/14/2023 1412   BUN 17 08/30/2013 1025   CREATININE 5.21 (H) 01/16/2024 0343   CREATININE 1.15 08/30/2013 1025   CALCIUM  8.5 (L) 01/16/2024 0343   CALCIUM  9.4 08/30/2013 1025   GFRNONAA 11 (L) 01/16/2024 0343   GFRNONAA >60 08/30/2013 1025   GFRAA 27 (L) 02/22/2020 0539   GFRAA >60 08/30/2013 1025    INR    Component Value Date/Time   INR 1.3 (H) 11/12/2023 1011     Intake/Output Summary (Last 24 hours) at 01/16/2024  1023 Last data filed at 01/15/2024 1948 Gross per 24 hour  Intake 120 ml  Output 2000 ml  Net -1880 ml      Assessment/Plan:  68 y.o. male is 2 days postop, s/p: Open left third amputation   - He endorses continued soreness in his left foot -His left third toe amputation site is currently dressed and dry -We have discussed with the patient that he is maximally revascularized and does not have a chance of healing his current amputation site.  The next up would be to proceed with TMA.  -He is currently scheduled for left TMA in the OR this afternoon.  He says that he needs to call his wife and discuss his situation prior to signing the consent paperwork  Ahmed Holster, [place] [facility] [phone] 01/16/2024 10:23 AM

## 2024-01-16 NOTE — Progress Notes (Signed)
 Called to get report on patient for scheduled surgery. No answer. Transport sent for patient.

## 2024-01-16 NOTE — Progress Notes (Signed)
 PROGRESS NOTE    Phillip [name]  FMW:[id number] DOB: Apr 25, 1956 DOA: 01/14/2024 PCP: [name] LABOR., MD  67/M w ESRD on HD TTS, DM-1, recent acute cholecystitis June 2025) requiring cholecystostomy tube placement, chronic HFrEF, aortic stenosis (TAVR postponed due to left third toe gangrene), PAD-s/p PCI to left tibia-fibula/angioplasty on 7/25 who was admitted by vascular surgery 8/13 for an elective left third toe amputation.  H/o significant issues with nonhealing ulceration of his left foot-progressing into a gangrenous left third toe.   Noninvasive vascular studies showed noncompressible vessels-hence he underwent a angiogram of LLE with transluminal angioplasty/stent placement to the left tibioperoneal trunk on 7/25 at [facility].  Plans were to proceed with a left third toe amputation post revascularization.  However due to his significant underlying medical comorbidities, this could not be done at [facility], so he was referred to the [facility] surgery service. - 8/13 underwent left third toe amputation including metatarsal head, unfortunately bone was not healthy, no capillary bleeding at this level, patient felt to require TMA or BKA however patient declined this. - Vascular requested hospitalist admission  Subjective: - Finally consented for transmetatarsal amputation this afternoon  Assessment and Plan:  Left third toe gangrene S/p amputation of left third toe 8/13, unfortunately had no bleeding during surgery suggestive of poor future healing, initially declined BKA or TMA  - Plan for transmetatarsal amputation today  He PAD s/p angioplasty/PCI to left tibioperoneal trunk on 7/25 See above regarding left third toe tissue. Continue aspirin /Plavix /statin.   ESRD on HD TTS -Nephrology following, HD today Per chart review-patient was tunneled HD catheter was recently removed on 8/6, he currently has a functioning LUE AVF   Normocytic anemia - Secondary to CKD, stable monitor    Chronic HFrEF Euvolemic Last echo 1/25 at OSH-EF 30-35% SEVERE LFLG Volume removal with hemodialysis Continue beta-blocker/Imdur .   Severe aortic stenosis -Followed by structural heart team,-plans are for TAVR next week   History of CAD s/p CABG 2003 No anginal symptoms -last cath 2/25: Severe CAD w/ all vein grafts occluded On aspirin /Plavix /statin.   Recent history of acute cholecystitis requiring cholecystostomy drain June 2025 Cholecystostomy drain remains in place Routine drain care Reviewed outpatient general surgery note-definite surgery to be done post TAVR   DM-2 Placing on SSI Follow CBGs and optimize accordingly.   Hypothyroidism Synthroid      DVT prophylaxis: Heparin  subcutaneous Code Status: Full code Family Communication: None present Disposition Plan: May need SNF    Objective: Vitals:   01/16/24 0150 01/16/24 0318 01/16/24 0745 01/16/24 1154  BP: (!) 103/44 111/66 118/68 (!) 85/51  Pulse: 90 88 84 86  Resp:  19 (!) 27 (!) 24  Temp:  99 F (37.2 C) 98.4 F (36.9 C) 98.5 F (36.9 C)  TempSrc:  Oral Oral Oral  SpO2: 95%   96%  Weight:      Height:        Intake/Output Summary (Last 24 hours) at 01/16/2024 1212 Last data filed at 01/15/2024 1948 Gross per 24 hour  Intake 120 ml  Output 2000 ml  Net -1880 ml   Filed Weights   01/14/24 1107 01/15/24 1155 01/15/24 1645  Weight: 83.5 kg 82.3 kg 80.3 kg    Examination:  General exam: Appears calm and comfortable  Respiratory system: Clear Cardiovascular system: S1 & S2 heard, RRR.  Abd: n soft nontender, bowel sounds present Central nervous system: Alert and oriented. No focal neurological deficits. Extremities: Left foot with dressing Skin: No rashes Psychiatry:  Mood & affect appropriate.     Data Reviewed:   CBC: Recent Labs  Lab 01/14/24 1135 01/14/24 1839 01/15/24 0338 01/16/24 0343  WBC  --  10.7* 8.6 12.4*  HGB 11.6* 10.1* 10.2* 10.7*  HCT 34.0* 32.0* 32.1* 33.7*   MCV  --  97.0 96.1 95.7  PLT  --  192 182 214   Basic Metabolic Panel: Recent Labs  Lab 01/14/24 1135 01/14/24 1839 01/15/24 0338 01/16/24 0343  NA 137  --  137 136  K 4.0  --  4.2 3.8  CL 96*  --  94* 94*  CO2  --   --  31 28  GLUCOSE 112*  --  89 97  BUN 29*  --  38* 25*  CREATININE 6.10* 6.37* 7.32* 5.21*  CALCIUM   --   --  8.6* 8.5*   GFR: Estimated Creatinine Clearance: 14.9 mL/min (A) (by C-G formula based on SCr of 5.21 mg/dL (H)). Liver Function Tests: Recent Labs  Lab 01/15/24 0338  AST 19  ALT 11  ALKPHOS 142*  BILITOT 0.8  PROT 7.6  ALBUMIN  2.1*   No results for input(s): LIPASE, AMYLASE in the last 168 hours. No results for input(s): AMMONIA in the last 168 hours. Coagulation Profile: No results for input(s): INR, PROTIME in the last 168 hours. Cardiac Enzymes: No results for input(s): CKTOTAL, CKMB, CKMBINDEX, TROPONINI in the last 168 hours. BNP (last 3 results) Recent Labs    07/14/23 1412  PROBNP 11,269*   HbA1C: No results for input(s): HGBA1C in the last 72 hours. CBG: Recent Labs  Lab 01/15/24 1852 01/15/24 2117 01/16/24 0633 01/16/24 0748 01/16/24 1156  GLUCAP 117* 129* 101* 95 99   Lipid Profile: No results for input(s): CHOL, HDL, LDLCALC, TRIG, CHOLHDL, LDLDIRECT in the last 72 hours. Thyroid  Function Tests: No results for input(s): TSH, T4TOTAL, FREET4, T3FREE, THYROIDAB in the last 72 hours. Anemia Panel: No results for input(s): VITAMINB12, FOLATE, FERRITIN, TIBC, IRON , RETICCTPCT in the last 72 hours. Urine analysis:    Component Value Date/Time   COLORURINE YELLOW 11/07/2020 0744   APPEARANCEUR CLEAR 11/07/2020 0744   LABSPEC 1.011 11/07/2020 0744   PHURINE 5.0 11/07/2020 0744   GLUCOSEU 50 (A) 11/07/2020 0744   HGBUR SMALL (A) 11/07/2020 0744   BILIRUBINUR NEGATIVE 11/07/2020 0744   KETONESUR NEGATIVE 11/07/2020 0744   PROTEINUR 100 (A) 11/07/2020 0744    NITRITE NEGATIVE 11/07/2020 0744   LEUKOCYTESUR NEGATIVE 11/07/2020 0744   Sepsis Labs: @LABRCNTIP (procalcitonin:4,lacticidven:4)  ) Recent Results (from the past 240 hours)  Surgical pcr screen     Status: None   Collection Time: 01/16/24  2:52 AM   Specimen: Nasal Mucosa; Nasal Swab  Result Value Ref Range Status   MRSA, PCR NEGATIVE NEGATIVE Final   Staphylococcus aureus NEGATIVE NEGATIVE Final    Comment: (NOTE) The Xpert SA Assay (FDA approved for NASAL specimens in patients 6 years of age and older), is one component of a comprehensive surveillance program. It is not intended to diagnose infection nor to guide or monitor treatment. Performed at [facility], [address], [place], [place] [address]      Radiology Studies: No results found.   Scheduled Meds:  aspirin  EC  81 mg Oral Daily   atorvastatin   20 mg Oral Daily   carvedilol   3.125 mg Oral BID   Chlorhexidine  Gluconate Cloth  6 each Topical Q0600   clopidogrel   75 mg Oral Daily   heparin   5,000 Units Subcutaneous  Q8H   insulin  aspart  0-6 Units Subcutaneous TID WC   isosorbide  mononitrate  120 mg Oral Daily   levothyroxine   50 mcg Oral Daily   senna  1 tablet Oral BID   sevelamer  carbonate  800 mg Oral TID WC   tamsulosin   0.4 mg Oral QPC breakfast   Continuous Infusions:   LOS: 2 days    Time spent:    Sigurd Pac, MD [facility]   01/16/2024, 12:12 PM

## 2024-01-16 NOTE — Transfer of Care (Signed)
 Immediate Anesthesia Transfer of Care Note  Patient: Phillip [name]  Procedure(s) Performed: AMPUTATION, FOOT, TRANSMETATARSAL (Left: Toe)  Patient Location: PACU  Anesthesia Type:MAC combined with regional for post-op pain  Level of Consciousness: awake and alert   Airway & Oxygen Therapy: Patient Spontanous Breathing and Patient connected to nasal cannula oxygen  Post-op Assessment: Report given to RN and Post -op Vital signs reviewed and stable  Post vital signs: Reviewed and stable  Last Vitals:  Vitals Value Taken Time  BP 101/56   Temp 97.5   Pulse 78 01/16/24 17:07  Resp 19 01/16/24 17:07  SpO2 98 % 01/16/24 17:07  Vitals shown include unfiled device data.  Last Pain:  Vitals:   01/16/24 1528  TempSrc:   PainSc: 8       Patients Stated Pain Goal: 3 (01/15/24 2011)  Complications: No notable events documented.

## 2024-01-17 DIAGNOSIS — I96 Gangrene, not elsewhere classified: Secondary | ICD-10-CM | POA: Diagnosis not present

## 2024-01-17 LAB — CBC
HCT: 31.3 % — ABNORMAL LOW (ref 39.0–52.0)
HCT: 33.3 % — ABNORMAL LOW (ref 39.0–52.0)
Hemoglobin: 9.9 g/dL — ABNORMAL LOW (ref 13.0–17.0)
Hemoglobin: 10.7 g/dL — ABNORMAL LOW (ref 13.0–17.0)
MCH: 30.2 pg (ref 26.0–34.0)
MCH: 30.6 pg (ref 26.0–34.0)
MCHC: 31.6 g/dL (ref 30.0–36.0)
MCHC: 32.1 g/dL (ref 30.0–36.0)
MCV: 95.4 fL (ref 80.0–100.0)
MCV: 95.1 fL (ref 80.0–100.0)
Platelets: 204 K/uL (ref 150–400)
Platelets: 226 K/uL (ref 150–400)
RBC: 3.28 MIL/uL — ABNORMAL LOW (ref 4.22–5.81)
RBC: 3.5 MIL/uL — ABNORMAL LOW (ref 4.22–5.81)
RDW: 15 % (ref 11.5–15.5)
RDW: 14.7 % (ref 11.5–15.5)
WBC: 8.3 K/uL (ref 4.0–10.5)
WBC: 10.5 K/uL (ref 4.0–10.5)
nRBC: 0 % (ref 0.0–0.2)
nRBC: 0 % (ref 0.0–0.2)

## 2024-01-17 LAB — BASIC METABOLIC PANEL WITH GFR
Anion gap: 13 (ref 5–15)
BUN: 48 mg/dL — ABNORMAL HIGH (ref 8–23)
CO2: 27 mmol/L (ref 22–32)
Calcium: 8.5 mg/dL — ABNORMAL LOW (ref 8.9–10.3)
Chloride: 95 mmol/L — ABNORMAL LOW (ref 98–111)
Creatinine, Ser: 7.95 mg/dL — ABNORMAL HIGH (ref 0.61–1.24)
GFR, Estimated: 7 mL/min — ABNORMAL LOW (ref >60)
Glucose, Bld: 141 mg/dL — ABNORMAL HIGH (ref 70–99)
Potassium: 4.1 mmol/L (ref 3.5–5.1)
Sodium: 135 mmol/L (ref 135–145)

## 2024-01-17 LAB — GLUCOSE, CAPILLARY
Glucose-Capillary: 99 mg/dL (ref 70–99)
Glucose-Capillary: 138 mg/dL — ABNORMAL HIGH (ref 70–99)
Glucose-Capillary: 144 mg/dL — ABNORMAL HIGH (ref 70–99)
Glucose-Capillary: 145 mg/dL — ABNORMAL HIGH (ref 70–99)

## 2024-01-17 NOTE — Plan of Care (Signed)

## 2024-01-17 NOTE — Progress Notes (Signed)
 [facility] Progress Note   Subjective:   Patient seen and examined at bedside. Reports feeling pretty good today.  Nurse reports he has been constipated.  No events reports overnight.   Objective Vitals:   01/17/24 0333 01/17/24 0700 01/17/24 0816 01/17/24 1159  BP: 114/62 128/75 117/68 116/62  Pulse: 78 83 79 78  Resp: 20  18 18   Temp: 99.2 F (37.3 C)  98.8 F (37.1 C) 98.7 F (37.1 C)  TempSrc: Oral  Oral Oral  SpO2: 95% 99% 97% 98%  Weight:      Height:       Physical Exam General:chronically ill appearing male in NAD Heart:RRR, no mrg Lungs:CTAB, nml WOB on RA Abdomen:soft, NTND Extremities:wound vac on L TMA, no LE edema Dialysis Access: LU AVF +b/t   Filed Weights   01/14/24 1107 01/15/24 1155 01/15/24 1645  Weight: 83.5 kg 82.3 kg 80.3 kg    Intake/Output Summary (Last 24 hours) at 01/17/2024 1344 Last data filed at 01/16/2024 2207 Gross per 24 hour  Intake 490 ml  Output 20 ml  Net 470 ml    Additional Objective Labs: Basic Metabolic Panel: Recent Labs  Lab 01/14/24 1135 01/14/24 1839 01/15/24 0338 01/16/24 0343  NA 137  --  137 136  K 4.0  --  4.2 3.8  CL 96*  --  94* 94*  CO2  --   --  31 28  GLUCOSE 112*  --  89 97  BUN 29*  --  38* 25*  CREATININE 6.10* 6.37* 7.32* 5.21*  CALCIUM   --   --  8.6* 8.5*   Liver Function Tests: Recent Labs  Lab 01/15/24 0338  AST 19  ALT 11  ALKPHOS 142*  BILITOT 0.8  PROT 7.6  ALBUMIN  2.1*   No results for input(s): LIPASE, AMYLASE in the last 168 hours. CBC: Recent Labs  Lab 01/14/24 1839 01/15/24 0338 01/16/24 0343 01/17/24 0320 01/17/24 1228  WBC 10.7* 8.6 12.4* 8.3 10.5  HGB 10.1* 10.2* 10.7* 9.9* 10.7*  HCT 32.0* 32.1* 33.7* 31.3* 33.3*  MCV 97.0 96.1 95.7 95.4 95.1  PLT 192 182 214 204 226   Blood Culture    Component Value Date/Time   SDES BILE 11/12/2023 1247   SPECREQUEST NONE 11/12/2023 1247   CULT MODERATE STREPTOCOCCUS SALIVARIUS 11/12/2023 1247    REPTSTATUS 11/14/2023 FINAL 11/12/2023 1247   Medications:   aspirin  EC  81 mg Oral Daily   atorvastatin   20 mg Oral Daily   carvedilol   3.125 mg Oral BID   Chlorhexidine  Gluconate Cloth  6 each Topical Q0600   Chlorhexidine  Gluconate Cloth  6 each Topical Q0600   clopidogrel   75 mg Oral Daily   heparin   5,000 Units Subcutaneous Q8H   insulin  aspart  0-6 Units Subcutaneous TID WC   isosorbide  mononitrate  120 mg Oral Daily   levothyroxine   50 mcg Oral Daily   senna  1 tablet Oral BID   sevelamer  carbonate  800 mg Oral TID WC   tamsulosin   0.4 mg Oral QPC breakfast    Dialysis Orders: [facility] TTS - No heparin  - Left AVF (in use for 2 weeks) - EDW 81 kg 4 hr 350/800 2K/2.5 Ca Bicarb 39 Mircera: 50 mcg ordered - but has not received any ESA yet No VDRA Renvela  800 mg TID WC   Assessment/Plan:  Left third toe gangrene: 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 Then TMA completed on 01/17/24 by Dr. Serene.  PAD  s/p angioplasty/PCI to left tibioperoneal trunk on 7/25: see above note  ESRD:  On TTS.  Compliant per home clinic. Due for HD today. Plan for next HD on Monday due to TAVR scheduled on Tuesday. Then resume regular schedule on Thursday.  Hypertension/volume: BP in goal. On carvedilol  3.125mg  BID - hold in AM on HD days.  Use albumin  with HD for BP support.  If needed midodrine can be added.  Does not appear grossly overloaded.  Plan for UF as tolerated.  Chronic HFrEF: Continue volume removal with HD. Continue beta blocker/Imdur  per primary.  Severe Aortic Stenosis: patient is followed by the structural heart team. Planned TAVR, but was delayed due to his necrotic toe.  Now planned for Tuesday.  Will schedule HD on Monday to optimize volume prior to surgery.   Recent acute cholecystitis requiring cholecystostomy drain 6/25: drain is in place.   Anemia:Hgb 10.7.  No indication for ESA at this time.   Metabolic bone disease: restarted home Sevelamer  800 TID WC. Corrected  calcium  in goal. Check phos. Not on VDRA at his OP clinic.   Nutrition:  Albumin  2.1. Push protein. On renal diet with fluid restriction  Manuelita Labella, PA-C [facility] 01/17/2024,1:44 PM  LOS: 3 days

## 2024-01-17 NOTE — Progress Notes (Addendum)
  Progress Note    01/17/2024 8:51 AM 1 Day Post-Op  Subjective: No complaints.  Denies any pain in his left foot    Vitals:   01/17/24 0333 01/17/24 0816  BP: 114/62 117/68  Pulse: 78 79  Resp: 20 18  Temp: 99.2 F (37.3 C) 98.8 F (37.1 C)  SpO2: 95% 97%    Physical Exam: General: Resting comfortably, NAD Lungs: Nonlabored Incisions: Left TMA with Prevena VAC with good seal  CBC    Component Value Date/Time   WBC 8.3 01/17/2024 0320   RBC 3.28 (L) 01/17/2024 0320   HGB 9.9 (L) 01/17/2024 0320   HGB 11.5 (L) 05/16/2023 1429   HCT 31.3 (L) 01/17/2024 0320   HCT 35.3 (L) 05/16/2023 1429   PLT 204 01/17/2024 0320   PLT 144 (L) 05/16/2023 1429   MCV 95.4 01/17/2024 0320   MCV 90 05/16/2023 1429   MCH 30.2 01/17/2024 0320   MCHC 31.6 01/17/2024 0320   RDW 15.0 01/17/2024 0320   RDW 13.5 05/16/2023 1429   LYMPHSABS 0.7 11/11/2023 1930   LYMPHSABS 0.7 05/16/2023 1429   MONOABS 0.8 11/11/2023 1930   EOSABS 0.0 11/11/2023 1930   EOSABS 0.1 05/16/2023 1429   BASOSABS 0.0 11/11/2023 1930   BASOSABS 0.0 05/16/2023 1429    BMET    Component Value Date/Time   NA 136 01/16/2024 0343   NA 145 (H) 07/14/2023 1412   NA 136 08/30/2013 1025   K 3.8 01/16/2024 0343   K 3.7 08/30/2013 1025   CL 94 (L) 01/16/2024 0343   CL 101 08/30/2013 1025   CO2 28 01/16/2024 0343   CO2 31 08/30/2013 1025   GLUCOSE 97 01/16/2024 0343   GLUCOSE 310 (H) 08/30/2013 1025   BUN 25 (H) 01/16/2024 0343   BUN 97 (HH) 07/14/2023 1412   BUN 17 08/30/2013 1025   CREATININE 5.21 (H) 01/16/2024 0343   CREATININE 1.15 08/30/2013 1025   CALCIUM  8.5 (L) 01/16/2024 0343   CALCIUM  9.4 08/30/2013 1025   GFRNONAA 11 (L) 01/16/2024 0343   GFRNONAA >60 08/30/2013 1025   GFRAA 27 (L) 02/22/2020 0539   GFRAA >60 08/30/2013 1025    INR    Component Value Date/Time   INR 1.3 (H) 11/12/2023 1011     Intake/Output Summary (Last 24 hours) at 01/17/2024 0851 Last data filed at 01/16/2024  2207 Gross per 24 hour  Intake 490 ml  Output 20 ml  Net 470 ml      Assessment/Plan:  68 y.o. male is 1 day postop, s/p: left TMA with placement of prevena vac   - He is doing well this morning and denies any pain in his left foot -Left TMA with Prevena VAC with good seal.  This will stay on for several more days for moisture wicking -Hemoglobin stable at 9.9 without further signs of blood loss -Will order postop Darco shoe for ambulation.  Okay to mobilize with PT/OT   Ahmed Holster, PA-C [facility] [phone] 01/17/2024 8:51 AM  I have independently interviewed and examined patient and agree with PA assessment and plan above.   Jenalee Trevizo C. Sheree, MD [facility] [place] Office: [phone] Pager: [phone]

## 2024-01-17 NOTE — Progress Notes (Signed)
 PROGRESS NOTE    Phillip [name]  FMW:[id number] DOB: [birth year] DOA: 01/14/2024 PCP: [name] LABOR., MD  68/M w ESRD on HD TTS, DM-1, recent acute cholecystitis June 2025) requiring cholecystostomy tube placement, chronic HFrEF, aortic stenosis (TAVR postponed due to left third toe gangrene), PAD-s/p PCI to left tibia-fibula/angioplasty on 7/25 who was admitted by vascular surgery 8/13 for an elective left third toe amputation.  H/o significant issues with nonhealing ulceration of his left foot-progressing into a gangrenous left third toe.   Noninvasive vascular studies showed noncompressible vessels-hence he underwent a angiogram of LLE with transluminal angioplasty/stent placement to the left tibioperoneal trunk on 7/25 at [facility].  Plans were to proceed with a left third toe amputation post revascularization.  However due to his significant underlying medical comorbidities, this could not be done at [facility], so he was referred to the [facility] surgery service. - 8/13 underwent left third toe amputation including metatarsal head, unfortunately bone was not healthy, no capillary bleeding at this level, patient felt to require TMA or BKA however patient declined this. - Vascular requested hospitalist admission - 8/15: Left foot transmetatarsal amputation  Subjective: - Feels fair, no events overnight, underwent hypotension yesterday  Assessment and Plan:  Left third toe gangrene S/p amputation of left third toe 8/13, unfortunately had no bleeding during surgery suggestive of poor future healing, initially declined BKA or TMA  - Sp transmetatarsal amputation and wound VAC placement - Continue current wound care - Discharge home pending TAVR on Tuesday  He PAD s/p angioplasty/PCI to left tibioperoneal trunk on 7/25 See above regarding left third toe tissue. Continue aspirin /Plavix /statin.   ESRD on HD TTS -Nephrology following, HD today Per chart review-patient was tunneled HD catheter was  recently removed on 8/6, he currently has a functioning LUE AVF   Normocytic anemia - Secondary to CKD, stable monitor   Chronic HFrEF Euvolemic Last echo 1/25 at OSH-EF 30-35% SEVERE LFLG Volume removal with hemodialysis Continue beta-blocker/Imdur .   Severe aortic stenosis -Followed by structural heart team,-plans are for TAVR on Tuesday   History of CAD s/p CABG 2003 No anginal symptoms -last cath 2/25: Severe CAD w/ all vein grafts occluded On aspirin /Plavix /statin.   Recent history of acute cholecystitis requiring cholecystostomy drain June 2025 Cholecystostomy drain remains in place Routine drain care Reviewed outpatient general surgery note-definite surgery to be done post TAVR   DM-2 Placing on SSI Follow CBGs and optimize accordingly.   Hypothyroidism Synthroid   Trace blood in stools 8/15 - Hold heparin  today, check CBC and monitor   DVT prophylaxis: Heparin  subcutaneous>held 8/15 Code Status: Full code Family Communication: None present Disposition Plan: May need SNF    Objective: Vitals:   01/16/24 2311 01/17/24 0333 01/17/24 0700 01/17/24 0816  BP: (!) 116/58 114/62 128/75 117/68  Pulse: 80 78 83 79  Resp: 20 20  18   Temp: 98.9 F (37.2 C) 99.2 F (37.3 C)  98.8 F (37.1 C)  TempSrc: Oral Oral  Oral  SpO2: 98% 95% 99% 97%  Weight:      Height:        Intake/Output Summary (Last 24 hours) at 01/17/2024 1126 Last data filed at 01/16/2024 2207 Gross per 24 hour  Intake 490 ml  Output 20 ml  Net 470 ml   Filed Weights   01/14/24 1107 01/15/24 1155 01/15/24 1645  Weight: 83.5 kg 82.3 kg 80.3 kg    Examination:  General exam: Appears calm and comfortable  Respiratory system: Clear Cardiovascular system: S1 &  S2 heard, RRR.  Abd: n soft nontender, bowel sounds present Central nervous system: Alert and oriented. No focal neurological deficits. Extremities: Left foot with dressing Skin: No rashes Psychiatry:  Mood & affect appropriate.      Data Reviewed:   CBC: Recent Labs  Lab 01/14/24 1135 01/14/24 1839 01/15/24 0338 01/16/24 0343 01/17/24 0320  WBC  --  10.7* 8.6 12.4* 8.3  HGB 11.6* 10.1* 10.2* 10.7* 9.9*  HCT 34.0* 32.0* 32.1* 33.7* 31.3*  MCV  --  97.0 96.1 95.7 95.4  PLT  --  192 182 214 204   Basic Metabolic Panel: Recent Labs  Lab 01/14/24 1135 01/14/24 1839 01/15/24 0338 01/16/24 0343  NA 137  --  137 136  K 4.0  --  4.2 3.8  CL 96*  --  94* 94*  CO2  --   --  31 28  GLUCOSE 112*  --  89 97  BUN 29*  --  38* 25*  CREATININE 6.10* 6.37* 7.32* 5.21*  CALCIUM   --   --  8.6* 8.5*   GFR: Estimated Creatinine Clearance: 14.9 mL/min (A) (by C-G formula based on SCr of 5.21 mg/dL (H)). Liver Function Tests: Recent Labs  Lab 01/15/24 0338  AST 19  ALT 11  ALKPHOS 142*  BILITOT 0.8  PROT 7.6  ALBUMIN  2.1*   No results for input(s): LIPASE, AMYLASE in the last 168 hours. No results for input(s): AMMONIA in the last 168 hours. Coagulation Profile: No results for input(s): INR, PROTIME in the last 168 hours. Cardiac Enzymes: No results for input(s): CKTOTAL, CKMB, CKMBINDEX, TROPONINI in the last 168 hours. BNP (last 3 results) Recent Labs    07/14/23 1412  PROBNP 11,269*   HbA1C: No results for input(s): HGBA1C in the last 72 hours. CBG: Recent Labs  Lab 01/16/24 1430 01/16/24 1707 01/16/24 1754 01/16/24 2112 01/17/24 0621  GLUCAP 98 95 99 123* 99   Lipid Profile: No results for input(s): CHOL, HDL, LDLCALC, TRIG, CHOLHDL, LDLDIRECT in the last 72 hours. Thyroid  Function Tests: No results for input(s): TSH, T4TOTAL, FREET4, T3FREE, THYROIDAB in the last 72 hours. Anemia Panel: No results for input(s): VITAMINB12, FOLATE, FERRITIN, TIBC, IRON , RETICCTPCT in the last 72 hours. Urine analysis:    Component Value Date/Time   COLORURINE YELLOW 11/07/2020 0744   APPEARANCEUR CLEAR 11/07/2020 0744   LABSPEC 1.011  11/07/2020 0744   PHURINE 5.0 11/07/2020 0744   GLUCOSEU 50 (A) 11/07/2020 0744   HGBUR SMALL (A) 11/07/2020 0744   BILIRUBINUR NEGATIVE 11/07/2020 0744   KETONESUR NEGATIVE 11/07/2020 0744   PROTEINUR 100 (A) 11/07/2020 0744   NITRITE NEGATIVE 11/07/2020 0744   LEUKOCYTESUR NEGATIVE 11/07/2020 0744   Sepsis Labs: @LABRCNTIP (procalcitonin:4,lacticidven:4)  ) Recent Results (from the past 240 hours)  Surgical pcr screen     Status: None   Collection Time: 01/16/24  2:52 AM   Specimen: Nasal Mucosa; Nasal Swab  Result Value Ref Range Status   MRSA, PCR NEGATIVE NEGATIVE Final   Staphylococcus aureus NEGATIVE NEGATIVE Final    Comment: (NOTE) The Xpert SA Assay (FDA approved for NASAL specimens in patients 49 years of age and older), is one component of a comprehensive surveillance program. It is not intended to diagnose infection nor to guide or monitor treatment. Performed at [facility], [address] [place], [place] [address]      Radiology Studies: No results found.   Scheduled Meds:  aspirin  EC  81 mg Oral Daily   atorvastatin   20 mg Oral Daily   carvedilol   3.125 mg Oral BID   Chlorhexidine  Gluconate Cloth  6 each Topical Q0600   Chlorhexidine  Gluconate Cloth  6 each Topical Q0600   clopidogrel   75 mg Oral Daily   heparin   5,000 Units Subcutaneous Q8H   insulin  aspart  0-6 Units Subcutaneous TID WC   isosorbide  mononitrate  120 mg Oral Daily   levothyroxine   50 mcg Oral Daily   senna  1 tablet Oral BID   sevelamer  carbonate  800 mg Oral TID WC   tamsulosin   0.4 mg Oral QPC breakfast   Continuous Infusions:   LOS: 3 days    Time spent:    Sigurd Pac, MD [facility]   01/17/2024, 11:26 AM

## 2024-01-17 NOTE — Progress Notes (Signed)
 Orthopedic Tech Progress Note Patient Details:  Phillip [name] 1956/05/15 [id number]  Darco wedge shoe provided vs normal PO shoe per clarification from [facility], PA-C. A size small was provided as the extra small may be too narrow for his foot.  Ortho Devices Type of Ortho Device: Darco shoe Ortho Device/Splint Location: For LLE, at bedside Ortho Device/Splint Interventions: Ordered, Adjustment   Post Interventions Instructions Provided: Care of device, Adjustment of device, Poper ambulation with device  Phillip [name] 01/17/2024, 9:33 AM

## 2024-01-17 NOTE — Progress Notes (Signed)
 Pt transferred to HD via bed with transport

## 2024-01-17 NOTE — Progress Notes (Signed)
 Change in condition: -2 small Bm's this morning with minimal blood noted ( blood not mixed in with stool).   -pt denies any hx or blood in stool or hemorrhoids. -Heparin  SQ held and MD notified

## 2024-01-18 DIAGNOSIS — I96 Gangrene, not elsewhere classified: Secondary | ICD-10-CM | POA: Diagnosis not present

## 2024-01-18 LAB — BASIC METABOLIC PANEL WITH GFR
Anion gap: 14 (ref 5–15)
BUN: 24 mg/dL — ABNORMAL HIGH (ref 8–23)
CO2: 27 mmol/L (ref 22–32)
Calcium: 8.6 mg/dL — ABNORMAL LOW (ref 8.9–10.3)
Chloride: 93 mmol/L — ABNORMAL LOW (ref 98–111)
Creatinine, Ser: 4.91 mg/dL — ABNORMAL HIGH (ref 0.61–1.24)
GFR, Estimated: 12 mL/min — ABNORMAL LOW (ref >60)
Glucose, Bld: 120 mg/dL — ABNORMAL HIGH (ref 70–99)
Potassium: 3.5 mmol/L (ref 3.5–5.1)
Sodium: 134 mmol/L — ABNORMAL LOW (ref 135–145)

## 2024-01-18 LAB — CBC
HCT: 33.6 % — ABNORMAL LOW (ref 39.0–52.0)
Hemoglobin: 10.9 g/dL — ABNORMAL LOW (ref 13.0–17.0)
MCH: 30.4 pg (ref 26.0–34.0)
MCHC: 32.4 g/dL (ref 30.0–36.0)
MCV: 93.6 fL (ref 80.0–100.0)
Platelets: 217 K/uL (ref 150–400)
RBC: 3.59 MIL/uL — ABNORMAL LOW (ref 4.22–5.81)
RDW: 14.6 % (ref 11.5–15.5)
WBC: 11.8 K/uL — ABNORMAL HIGH (ref 4.0–10.5)
nRBC: 0 % (ref 0.0–0.2)

## 2024-01-18 LAB — GLUCOSE, CAPILLARY
Glucose-Capillary: 118 mg/dL — ABNORMAL HIGH (ref 70–99)
Glucose-Capillary: 133 mg/dL — ABNORMAL HIGH (ref 70–99)
Glucose-Capillary: 148 mg/dL — ABNORMAL HIGH (ref 70–99)
Glucose-Capillary: 170 mg/dL — ABNORMAL HIGH (ref 70–99)
Glucose-Capillary: 107 mg/dL — ABNORMAL HIGH (ref 70–99)

## 2024-01-18 MED ORDER — MENTHOL 3 MG MT LOZG
1.0000 | LOZENGE | OROMUCOSAL | Status: DC | PRN
  Administered 2024-01-18: 3 mg via ORAL
  Filled 2024-01-18: qty 9

## 2024-01-18 MED ORDER — OXYCODONE HCL 5 MG PO TABS
ORAL_TABLET | ORAL | Status: AC
  Filled 2024-01-18: qty 1

## 2024-01-18 MED ORDER — CHLORHEXIDINE GLUCONATE CLOTH 2 % EX PADS
6.0000 | MEDICATED_PAD | Freq: Every day | CUTANEOUS | Status: DC
  Administered 2024-01-19: 6 via TOPICAL

## 2024-01-18 NOTE — Progress Notes (Signed)
 PROGRESS NOTE    Phillip [name]  FMW:[id number] DOB: 23-Jun-1955 DOA: 01/14/2024 PCP: [name] LABOR., MD  68/M w ESRD on HD TTS, DM-1, recent acute cholecystitis June 2025) requiring cholecystostomy tube placement, chronic HFrEF, aortic stenosis (TAVR postponed due to left third toe gangrene), PAD-s/p PCI to left tibia-fibula/angioplasty on 7/25 who was admitted by vascular surgery 8/13 for an elective left third toe amputation.  H/o significant issues with nonhealing ulceration of his left foot-progressing into a gangrenous left third toe,  underwent a angiogram of LLE with transluminal angioplasty/stent placement to the left tibioperoneal trunk on 7/25 at [facility].  Plans were to proceed with a left third toe amputation post revascularization.  However due to his significant underlying medical comorbidities, this could not be done at [facility], so he was referred to the [facility] surgery service. - 8/13 underwent left third toe amputation including metatarsal head, unfortunately bone was not healthy, no capillary bleeding at this level, patient felt to require TMA or BKA however patient declined this. - Vascular requested hospitalist admission - 8/15: Left foot transmetatarsal amputation  Subjective: - Okay, no events overnight, dialysis was pretty late last night  Assessment and Plan:  Left third toe gangrene S/p amputation of left third toe 8/13, unfortunately had no bleeding during surgery suggestive of poor future healing, initially declined BKA or TMA  - Sp transmetatarsal amputation and wound VAC placement - Continue current wound care - Discharge home pending TAVR on Tuesday  He PAD s/p angioplasty/PCI to left tibioperoneal trunk on 7/25 See above regarding left third toe tissue. Continue aspirin /Plavix /statin.   ESRD on HD TTS -Nephrology following, HD today Per chart review-patient was tunneled HD catheter was recently removed on 8/6, he currently has a functioning LUE AVF    Normocytic anemia - Secondary to CKD, stable monitor   Chronic HFrEF Euvolemic Last echo 1/25 at OSH-EF 30-35% SEVERE LFLG Volume removal with hemodialysis Continue beta-blocker/Imdur .   Severe aortic stenosis -Followed by structural heart team,-plans are for TAVR on Tuesday   History of CAD s/p CABG 2003 No anginal symptoms -last cath 2/25: Severe CAD w/ all vein grafts occluded On aspirin /Plavix /statin.   Recent history of acute cholecystitis requiring cholecystostomy drain June 2025 Cholecystostomy drain remains in place Routine drain care Reviewed outpatient general surgery note-definite surgery to be done post TAVR   DM-2 Placing on SSI Follow CBGs and optimize accordingly.   Hypothyroidism Synthroid   Trace blood in stools 8/15 - Hold heparin  today, check CBC and monitor   DVT prophylaxis: Heparin  subcutaneous>held 8/15 Code Status: Full code Family Communication: None present Disposition Plan: May need SNF    Objective: Vitals:   01/18/24 0150 01/18/24 0205 01/18/24 0332 01/18/24 0923  BP: 122/66 137/62 117/63 129/77  Pulse: 93 (!) 34 79 80  Resp: (!) 23 (!) 24 20 13   Temp:   98.5 F (36.9 C) 98.6 F (37 C)  TempSrc:   Oral Oral  SpO2: 94% 97% 96% 100%  Weight:      Height:        Intake/Output Summary (Last 24 hours) at 01/18/2024 1122 Last data filed at 01/18/2024 0600 Gross per 24 hour  Intake --  Output 1100 ml  Net -1100 ml   Filed Weights   01/14/24 1107 01/15/24 1155 01/15/24 1645  Weight: 83.5 kg 82.3 kg 80.3 kg    Examination:  General exam: Appears calm and comfortable  Respiratory system: Clear Cardiovascular system: S1 & S2 heard, RRR.  Abd: n soft nontender,  bowel sounds present Central nervous system: Alert and oriented. No focal neurological deficits. Extremities: Left foot with dressing Skin: No rashes Psychiatry:  Mood & affect appropriate.     Data Reviewed:   CBC: Recent Labs  Lab 01/15/24 0338  01/16/24 0343 01/17/24 0320 01/17/24 1228 01/18/24 0340  WBC 8.6 12.4* 8.3 10.5 11.8*  HGB 10.2* 10.7* 9.9* 10.7* 10.9*  HCT 32.1* 33.7* 31.3* 33.3* 33.6*  MCV 96.1 95.7 95.4 95.1 93.6  PLT 182 214 204 226 217   Basic Metabolic Panel: Recent Labs  Lab 01/14/24 1135 01/14/24 1839 01/15/24 0338 01/16/24 0343 01/17/24 1228 01/18/24 0340  NA 137  --  137 136 135 134*  K 4.0  --  4.2 3.8 4.1 3.5  CL 96*  --  94* 94* 95* 93*  CO2  --   --  31 28 27 27   GLUCOSE 112*  --  89 97 141* 120*  BUN 29*  --  38* 25* 48* 24*  CREATININE 6.10* 6.37* 7.32* 5.21* 7.95* 4.91*  CALCIUM   --   --  8.6* 8.5* 8.5* 8.6*   GFR: Estimated Creatinine Clearance: 15.8 mL/min (A) (by C-G formula based on SCr of 4.91 mg/dL (H)). Liver Function Tests: Recent Labs  Lab 01/15/24 0338  AST 19  ALT 11  ALKPHOS 142*  BILITOT 0.8  PROT 7.6  ALBUMIN  2.1*   No results for input(s): LIPASE, AMYLASE in the last 168 hours. No results for input(s): AMMONIA in the last 168 hours. Coagulation Profile: No results for input(s): INR, PROTIME in the last 168 hours. Cardiac Enzymes: No results for input(s): CKTOTAL, CKMB, CKMBINDEX, TROPONINI in the last 168 hours. BNP (last 3 results) Recent Labs    07/14/23 1412  PROBNP 11,269*   HbA1C: No results for input(s): HGBA1C in the last 72 hours. CBG: Recent Labs  Lab 01/17/24 1157 01/17/24 1740 01/17/24 2140 01/18/24 0138 01/18/24 0643  GLUCAP 138* 144* 145* 118* 133*   Lipid Profile: No results for input(s): CHOL, HDL, LDLCALC, TRIG, CHOLHDL, LDLDIRECT in the last 72 hours. Thyroid  Function Tests: No results for input(s): TSH, T4TOTAL, FREET4, T3FREE, THYROIDAB in the last 72 hours. Anemia Panel: No results for input(s): VITAMINB12, FOLATE, FERRITIN, TIBC, IRON , RETICCTPCT in the last 72 hours. Urine analysis:    Component Value Date/Time   COLORURINE YELLOW 11/07/2020 0744   APPEARANCEUR  CLEAR 11/07/2020 0744   LABSPEC 1.011 11/07/2020 0744   PHURINE 5.0 11/07/2020 0744   GLUCOSEU 50 (A) 11/07/2020 0744   HGBUR SMALL (A) 11/07/2020 0744   BILIRUBINUR NEGATIVE 11/07/2020 0744   KETONESUR NEGATIVE 11/07/2020 0744   PROTEINUR 100 (A) 11/07/2020 0744   NITRITE NEGATIVE 11/07/2020 0744   LEUKOCYTESUR NEGATIVE 11/07/2020 0744   Sepsis Labs: @[email or web address] (procalcitonin:4,lacticidven:4)  ) Recent Results (from the past 240 hours)  Surgical pcr screen     Status: None   Collection Time: 01/16/24  2:52 AM   Specimen: Nasal Mucosa; Nasal Swab  Result Value Ref Range Status   MRSA, PCR NEGATIVE NEGATIVE Final   Staphylococcus aureus NEGATIVE NEGATIVE Final    Comment: (NOTE) The Xpert SA Assay (FDA approved for NASAL specimens in patients 19 years of age and older), is one component of a comprehensive surveillance program. It is not intended to diagnose infection nor to guide or monitor treatment. Performed at [facility], [address], [place], [place] [address]      Radiology Studies: No results found.   Scheduled Meds:  aspirin  EC  81  mg Oral Daily   atorvastatin   20 mg Oral Daily   carvedilol   3.125 mg Oral BID   Chlorhexidine  Gluconate Cloth  6 each Topical Q0600   Chlorhexidine  Gluconate Cloth  6 each Topical Q0600   clopidogrel   75 mg Oral Daily   heparin   5,000 Units Subcutaneous Q8H   insulin  aspart  0-6 Units Subcutaneous TID WC   isosorbide  mononitrate  120 mg Oral Daily   levothyroxine   50 mcg Oral Daily   senna  1 tablet Oral BID   sevelamer  carbonate  800 mg Oral TID WC   tamsulosin   0.4 mg Oral QPC breakfast   Continuous Infusions:   LOS: 4 days    Time spent:    Sigurd Pac, MD [facility]   01/18/2024, 11:22 AM

## 2024-01-18 NOTE — Progress Notes (Signed)
 [facility] Progress Note   Subjective:   Patient seen and examined at bedside.  A little tired today, HD completed overnight. Some pain in L foot.  No other complaints.   Objective Vitals:   01/18/24 0205 01/18/24 0332 01/18/24 0923 01/18/24 1342  BP: 137/62 117/63 129/77 116/69  Pulse: (!) 34 79 80   Resp: (!) 24 20 13 16   Temp:  98.5 F (36.9 C) 98.6 F (37 C) 97.8 F (36.6 C)  TempSrc:  Oral Oral Oral  SpO2: 97% 96% 100%   Weight:      Height:       Physical Exam General:Chronically ill appearing male in NAD Heart:RRR Lungs:nml WOB on RA Abdomen:soft, NTND Extremities:no LE edema, L TMA w/wound vac in place Dialysis Access: LU AVF +b/t   Filed Weights   01/14/24 1107 01/15/24 1155 01/15/24 1645  Weight: 83.5 kg 82.3 kg 80.3 kg    Intake/Output Summary (Last 24 hours) at 01/18/2024 1353 Last data filed at 01/18/2024 0600 Gross per 24 hour  Intake --  Output 1100 ml  Net -1100 ml    Additional Objective Labs: Basic Metabolic Panel: Recent Labs  Lab 01/16/24 0343 01/17/24 1228 01/18/24 0340  NA 136 135 134*  K 3.8 4.1 3.5  CL 94* 95* 93*  CO2 28 27 27   GLUCOSE 97 141* 120*  BUN 25* 48* 24*  CREATININE 5.21* 7.95* 4.91*  CALCIUM  8.5* 8.5* 8.6*   Liver Function Tests: Recent Labs  Lab 01/15/24 0338  AST 19  ALT 11  ALKPHOS 142*  BILITOT 0.8  PROT 7.6  ALBUMIN  2.1*   CBC: Recent Labs  Lab 01/15/24 0338 01/16/24 0343 01/17/24 0320 01/17/24 1228 01/18/24 0340  WBC 8.6 12.4* 8.3 10.5 11.8*  HGB 10.2* 10.7* 9.9* 10.7* 10.9*  HCT 32.1* 33.7* 31.3* 33.3* 33.6*  MCV 96.1 95.7 95.4 95.1 93.6  PLT 182 214 204 226 217   CBG: Recent Labs  Lab 01/17/24 1740 01/17/24 2140 01/18/24 0138 01/18/24 0643 01/18/24 1256  GLUCAP 144* 145* 118* 133* 148*    Medications:   aspirin  EC  81 mg Oral Daily   atorvastatin   20 mg Oral Daily   carvedilol   3.125 mg Oral BID   Chlorhexidine  Gluconate Cloth  6 each Topical Q0600    Chlorhexidine  Gluconate Cloth  6 each Topical Q0600   clopidogrel   75 mg Oral Daily   heparin   5,000 Units Subcutaneous Q8H   insulin  aspart  0-6 Units Subcutaneous TID WC   isosorbide  mononitrate  120 mg Oral Daily   levothyroxine   50 mcg Oral Daily   senna  1 tablet Oral BID   sevelamer  carbonate  800 mg Oral TID WC   tamsulosin   0.4 mg Oral QPC breakfast    Dialysis Orders: [facility] HD TTS - No heparin  - Left AVF (in use for 2 weeks) - EDW 81 kg 4 hr 350/800 2K/2.5 Ca Bicarb 39 Mircera: 50 mcg ordered - but has not received any ESA yet No VDRA Renvela  800 mg TID WC   Assessment/Plan:  Left third toe gangrene: 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 Then TMA completed on 01/17/24 by Dr. Serene.  PAD - s/p angioplasty/PCI to left tibioperoneal trunk on 7/25.  ESRD:  On TTS.  Compliant per home clinic. Next HD on 01/19/24. Plan for next HD on Monday due to TAVR scheduled on Tuesday. Then resume regular schedule on Thursday.  Hypertension/volume: BP in goal. On carvedilol  3.125mg  BID - hold  in AM on HD days.  Use albumin  with HD for BP support.  If needed midodrine can be added.  Does not appear grossly overloaded.  Plan for UF as tolerated.  Chronic HFrEF: Continue volume removal with HD. Continue beta blocker/Imdur  per primary.  Severe Aortic Stenosis: patient is followed by the structural heart team. Planned TAVR, but was delayed due to his necrotic toe.  Now planned for Tuesday.  Will schedule HD on Monday to optimize volume prior to surgery.   Recent acute cholecystitis requiring cholecystostomy drain 6/25: drain is in place.   Anemia:Hgb 10.9.  No indication for ESA at this time.   Metabolic bone disease: restarted home Sevelamer  800 TID WC. Corrected calcium  in goal. Check phos. Not on VDRA at his OP clinic.   Nutrition:  Albumin  2.1. Push protein. On renal diet with fluid restriction  Manuelita Labella, PA-C [facility] 01/18/2024,1:53 PM  LOS: 4 days

## 2024-01-18 NOTE — Progress Notes (Signed)
 HD Note:  Some information was entered later than the data was gathered due to patient care needs. The stated time with the data is accurate.  Received patient in bed to unit.   Alert and oriented.   Informed consent signed and in chart.   Access used: Left arm AVF Access issues: None  Patient tolerated treatment fairly well; hypotension episode  TX duration: 2.75 hrs  Alert, without acute distress.  Total UF removed: 1L . Goal not met  Hand-off given to patient's nurse.   Transported back to the room   Bobetta Lango RN [facility].

## 2024-01-18 NOTE — Progress Notes (Signed)
 Inpatient Rehab Admissions Coordinator:   Per therapy recommendations, patient was screened for CIR candidacy by Leita Kleine, MS, CCC-SLP. At this time, Pt. does not appear to demonstrate medical necessity to justify in hospital rehabilitation/CIR. will not pursue a rehab consult for this Pt.   Recommend other rehab venues to be pursued.  Please contact me with any questions.  Leita Kleine, MS, CCC-SLP Rehab Admissions Coordinator  [phone] (celll) [phone] (office)

## 2024-01-18 NOTE — Evaluation (Signed)
 Physical Therapy Evaluation Patient Details Name: Phillip [name] MRN: [id number] DOB: [birth year] Today's Date: 01/18/2024  History of Present Illness  Phillip [name] is a 68 y.o. male admitted 01/14/24 for left third toe gangrene. Pt s/p left third toe amputation including met head 8/13 and left TMA with wound vac application 8/15. PMHx: T1DM, PAD s/p angioplasty/PCI to left tibioperoneal trunk on 7/25, ESRD on HD TTS, HTN, chronic HFrEF, severe aortic stenosis (TAVR postponed to 8/19), acute cholecystitis requiring cholecystostomy drain 6/25.   Clinical Impression  Pt admitted with above diagnosis. PTA, pt was independent with functional mobility, ADLs, and most IADLs. He lives with his wife in a one story house with 2 STE and railings. Pt currently with functional limitations due to the deficits listed below (see PT Problem List). He performed bed mobility with supervision and required min-modA for transfers and gait using RW. Pt currently limited by symptomatic orthostatic hypotension, pain, and decreased activity tolerance. Pt will benefit from acute skilled PT to increase their independence and safety with mobility to allow discharge. Recommend intensive inpatient follow-up therapy, >3 hours/day.    01/18/24 1100  Vital Signs  Patient Position (if appropriate) Orthostatic Vitals  Orthostatic Lying   BP- Lying 129/77  Orthostatic Sitting  BP- Sitting 101/56 (pt reported dizziness)  Orthostatic Standing at 0 minutes  BP- Standing at 0 minutes (!) 79/53 (pt with worsening dizziness)  Orthostatic Standing at 3 minutes  BP- Standing at 3 minutes  (NT - pt unable to maintain static stance for 3 mins d/t worsening symptoms and fatigue; Upon sitting in recliner chair 119/63)         If plan is discharge home, recommend the following: A lot of help with walking and/or transfers;A lot of help with bathing/dressing/bathroom;Assistance with cooking/housework;Assist for transportation;Help  with stairs or ramp for entrance   Can travel by private vehicle   No    Equipment Recommendations None recommended by PT  Recommendations for Other Services  Rehab consult    Functional Status Assessment Patient has had a recent decline in their functional status and demonstrates the ability to make significant improvements in function in a reasonable and predictable amount of time.     Precautions / Restrictions Precautions Precautions: Fall Recall of Precautions/Restrictions: Intact Precaution/Restrictions Comments: watch BP; R abdominal drain; L foot wound vac Required Braces or Orthoses: Other Brace Other Brace: Darco wedge shoe LLE Restrictions Weight Bearing Restrictions Per Provider Order: No      Mobility  Bed Mobility Overal bed mobility: Needs Assistance Bed Mobility: Supine to Sit     Supine to sit: HOB elevated, Used rails, Supervision     General bed mobility comments: Pt sat up on L side of bed with increased time. Assist to manage lines/leads.    Transfers Overall transfer level: Needs assistance Equipment used: Rolling walker (2 wheels) Transfers: Sit to/from Stand, Bed to chair/wheelchair/BSC Sit to Stand: Min assist   Step pivot transfers: Min assist, Mod assist       General transfer comment: Pt stood from lowest bed height. Cued proper hand placement using RW. Powered up with minA. Cues to push down through BUE support on RW to increase support and minA to stabilize. Transferred to recliner chair on left. Good eccentric control.    Ambulation/Gait Ambulation/Gait assistance: Min assist, Mod assist Gait Distance (Feet): 2 Feet Assistive device: Rolling walker (2 wheels) Gait Pattern/deviations: Step-to pattern, Decreased step length - right, Decreased step length - left, Decreased stance time -  left, Decreased weight shift to left Gait velocity: decreased     General Gait Details: Pt took short slow steps. Cues for sequencing. Assist to  support trunk, manuever RW, and manage lines/leads.  Stairs            Wheelchair Mobility     Tilt Bed    Modified Rankin (Stroke Patients Only)       Balance Overall balance assessment: Needs assistance Sitting-balance support: No upper extremity supported, Feet supported Sitting balance-Leahy Scale: Fair Sitting balance - Comments: Pt sat EOB with supervision   Standing balance support: Bilateral upper extremity supported, During functional activity, Reliant on assistive device for balance Standing balance-Leahy Scale: Poor Standing balance comment: Pt dependent on RW                             Pertinent Vitals/Pain Pain Assessment Pain Assessment: 0-10 Pain Score: 7  Pain Location: L foot Pain Descriptors / Indicators: Discomfort, Aching Pain Intervention(s): Monitored during session, Limited activity within patient's tolerance, Repositioned    Home Living Family/patient expects to be discharged to:: Private residence Living Arrangements: Spouse/significant other Available Help at Discharge: Family;Available 24 hours/day Type of Home: House Home Access: Stairs to enter Entrance Stairs-Rails: Right;Left;Can reach both Entrance Stairs-Number of Steps: 2   Home Layout: One level Home Equipment: Agricultural consultant (2 wheels);Shower seat;BSC/3in1;Grab bars - toilet (hurry-cane)      Prior Function Prior Level of Function : Independent/Modified Independent;Driving             Mobility Comments: Ambulates with AD. No falls in the past 104mo. ADLs Comments: Indep with ADLs. Wife manages medications. Both share household chores.     Extremity/Trunk Assessment   Upper Extremity Assessment Upper Extremity Assessment: Generalized weakness;Right hand dominant    Lower Extremity Assessment Lower Extremity Assessment: Generalized weakness    Cervical / Trunk Assessment Cervical / Trunk Assessment: Normal  Communication    Communication Communication: No apparent difficulties    Cognition Arousal: Alert Behavior During Therapy: WFL for tasks assessed/performed   PT - Cognitive impairments: No apparent impairments                       PT - Cognition Comments: Pt A,Ox4 Following commands: Intact       Cueing Cueing Techniques: Verbal cues     General Comments General comments (skin integrity, edema, etc.): Pt with positive orthostatic hypotension.    Exercises     Assessment/Plan    PT Assessment Patient needs continued PT services  PT Problem List Decreased strength;Decreased activity tolerance;Decreased balance;Decreased mobility;Decreased knowledge of use of DME;Decreased knowledge of precautions       PT Treatment Interventions DME instruction;Gait training;Stair training;Functional mobility training;Therapeutic activities;Therapeutic exercise;Balance training;Patient/family education    PT Goals (Current goals can be found in the Care Plan section)  Acute Rehab PT Goals Patient Stated Goal: Recover well to be able to return Home PT Goal Formulation: With patient Time For Goal Achievement: 02/01/24 Potential to Achieve Goals: Good    Frequency Min 2X/week     Co-evaluation               AM-PAC PT 6 Clicks Mobility  Outcome Measure Help needed turning from your back to your side while in a flat bed without using bedrails?: A Little Help needed moving from lying on your back to sitting on the side of a flat bed without using bedrails?:  A Little Help needed moving to and from a bed to a chair (including a wheelchair)?: A Lot Help needed standing up from a chair using your arms (e.g., wheelchair or bedside chair)?: A Little Help needed to walk in hospital room?: Total Help needed climbing 3-5 steps with a railing? : Total 6 Click Score: 13    End of Session Equipment Utilized During Treatment: Gait belt Activity Tolerance: Patient tolerated treatment  well;Treatment limited secondary to medical complications (Comment) (symptomatic orthostatic hypotension) Patient left: in chair;with call bell/phone within reach;with chair alarm set Nurse Communication: Mobility status;Other (comment) (Symptomatic orthostatic hypotension) PT Visit Diagnosis: Muscle weakness (generalized) (M62.81);Difficulty in walking, not elsewhere classified (R26.2);Unsteadiness on feet (R26.81);Other abnormalities of gait and mobility (R26.89)    Time: 8956-8890 PT Time Calculation (min) (ACUTE ONLY): 26 min   Charges:   PT Evaluation $PT Eval Moderate Complexity: 1 Mod   PT General Charges $$ ACUTE PT VISIT: 1 Visit        Phillip [name], PT, DPT Acute Rehabilitation Services Office: [phone] Secure Chat Preferred  Phillip [name] 01/18/2024, 1:18 PM

## 2024-01-18 NOTE — Progress Notes (Signed)
 Called Dr. Fairy to make aware that pt was having several short bursts of afib to 160's starting at 16:30. Pt states he did not feel like heart rate was elevated at the time. Pt was having some pain in foot and medication given. Pt has known bundle branch as well. Pt with frequent unifocal and multifocal PVCs. Pt is on board for TAVR next week? At baseline heart rate in 80's. Pt in elink room and elink RN aware. Ann Nena Hoard, RN

## 2024-01-19 ENCOUNTER — Encounter (HOSPITAL_COMMUNITY): Payer: Self-pay | Admitting: Surgery

## 2024-01-19 DIAGNOSIS — I96 Gangrene, not elsewhere classified: Secondary | ICD-10-CM | POA: Diagnosis not present

## 2024-01-19 DIAGNOSIS — Z89432 Acquired absence of left foot: Secondary | ICD-10-CM

## 2024-01-19 LAB — COMPREHENSIVE METABOLIC PANEL WITH GFR
ALT: <5 U/L (ref 0–44)
AST: 22 U/L (ref 15–41)
Albumin: 2.5 g/dL — ABNORMAL LOW (ref 3.5–5.0)
Alkaline Phosphatase: 161 U/L — ABNORMAL HIGH (ref 38–126)
Anion gap: 16 — ABNORMAL HIGH (ref 5–15)
BUN: 49 mg/dL — ABNORMAL HIGH (ref 8–23)
CO2: 26 mmol/L (ref 22–32)
Calcium: 9 mg/dL (ref 8.9–10.3)
Chloride: 92 mmol/L — ABNORMAL LOW (ref 98–111)
Creatinine, Ser: 8.2 mg/dL — ABNORMAL HIGH (ref 0.61–1.24)
GFR, Estimated: 7 mL/min — ABNORMAL LOW (ref >60)
Glucose, Bld: 105 mg/dL — ABNORMAL HIGH (ref 70–99)
Potassium: 4.5 mmol/L (ref 3.5–5.1)
Sodium: 134 mmol/L — ABNORMAL LOW (ref 135–145)
Total Bilirubin: 0.8 mg/dL (ref 0.0–1.2)
Total Protein: 9.4 g/dL — ABNORMAL HIGH (ref 6.5–8.1)

## 2024-01-19 LAB — GLUCOSE, CAPILLARY
Glucose-Capillary: 123 mg/dL — ABNORMAL HIGH (ref 70–99)
Glucose-Capillary: 116 mg/dL — ABNORMAL HIGH (ref 70–99)
Glucose-Capillary: 96 mg/dL (ref 70–99)
Glucose-Capillary: 124 mg/dL — ABNORMAL HIGH (ref 70–99)

## 2024-01-19 LAB — CBC
HCT: 32.9 % — ABNORMAL LOW (ref 39.0–52.0)
Hemoglobin: 10.7 g/dL — ABNORMAL LOW (ref 13.0–17.0)
MCH: 30.6 pg (ref 26.0–34.0)
MCHC: 32.5 g/dL (ref 30.0–36.0)
MCV: 94 fL (ref 80.0–100.0)
Platelets: 225 K/uL (ref 150–400)
RBC: 3.5 MIL/uL — ABNORMAL LOW (ref 4.22–5.81)
RDW: 14.9 % (ref 11.5–15.5)
WBC: 11.1 K/uL — ABNORMAL HIGH (ref 4.0–10.5)
nRBC: 0 % (ref 0.0–0.2)

## 2024-01-19 LAB — BASIC METABOLIC PANEL WITH GFR
Anion gap: 15 (ref 5–15)
BUN: 43 mg/dL — ABNORMAL HIGH (ref 8–23)
CO2: 25 mmol/L (ref 22–32)
Calcium: 8.7 mg/dL — ABNORMAL LOW (ref 8.9–10.3)
Chloride: 93 mmol/L — ABNORMAL LOW (ref 98–111)
Creatinine, Ser: 7.16 mg/dL — ABNORMAL HIGH (ref 0.61–1.24)
GFR, Estimated: 8 mL/min — ABNORMAL LOW (ref >60)
Glucose, Bld: 129 mg/dL — ABNORMAL HIGH (ref 70–99)
Potassium: 4.2 mmol/L (ref 3.5–5.1)
Sodium: 133 mmol/L — ABNORMAL LOW (ref 135–145)

## 2024-01-19 LAB — PROTIME-INR
INR: 1.1 (ref 0.8–1.2)
Prothrombin Time: 14.8 s (ref 11.4–15.2)

## 2024-01-19 LAB — APTT: aPTT: 39 s — ABNORMAL HIGH (ref 24–36)

## 2024-01-19 LAB — MAGNESIUM: Magnesium: 2.1 mg/dL (ref 1.7–2.4)

## 2024-01-19 MED ORDER — MAGNESIUM SULFATE 50 % IJ SOLN
40.0000 meq | INTRAMUSCULAR | Status: AC
  Filled 2024-01-19: qty 9.85

## 2024-01-19 MED ORDER — TEMAZEPAM 15 MG PO CAPS: 15.0000 mg | ORAL_CAPSULE | Freq: Once | ORAL | Status: DC | PRN

## 2024-01-19 MED ORDER — HEPARIN 30,000 UNITS/1000 ML (OHS) CELLSAVER SOLUTION
Status: AC
  Filled 2024-01-19: qty 1000

## 2024-01-19 MED ORDER — POTASSIUM CHLORIDE 2 MEQ/ML IV SOLN
80.0000 meq | INTRAVENOUS | Status: AC
  Filled 2024-01-19: qty 40

## 2024-01-19 MED ORDER — ANTICOAGULANT SODIUM CITRATE 4% (200MG/5ML) IV SOLN
5.0000 mL | Status: DC | PRN
  Filled 2024-01-19: qty 5

## 2024-01-19 MED ORDER — CARVEDILOL 3.125 MG PO TABS
3.1250 mg | ORAL_TABLET | Freq: Two times a day (BID) | ORAL | Status: DC
  Administered 2024-01-19 – 2024-02-05 (×28): 3.125 mg via ORAL
  Filled 2024-01-19 (×31): qty 1

## 2024-01-19 MED ORDER — CHLORHEXIDINE GLUCONATE 4 % EX SOLN
1.0000 | Freq: Once | CUTANEOUS | Status: DC
  Filled 2024-01-19: qty 15

## 2024-01-19 MED ORDER — CHLORHEXIDINE GLUCONATE 0.12 % MT SOLN: 15.0000 mL | Freq: Once | OROMUCOSAL | Status: DC

## 2024-01-19 MED ORDER — HYDROMORPHONE HCL 1 MG/ML IJ SOLN
INTRAMUSCULAR | Status: AC
Start: 2024-01-19 — End: 2024-01-19
  Filled 2024-01-19: qty 0.5

## 2024-01-19 MED ORDER — PENTAFLUOROPROP-TETRAFLUOROETH EX AERO
1.0000 | INHALATION_SPRAY | CUTANEOUS | Status: DC | PRN
Start: 2024-01-19 — End: 2024-01-19

## 2024-01-19 MED ORDER — CEFAZOLIN SODIUM-DEXTROSE 2-4 GM/100ML-% IV SOLN
2.0000 g | INTRAVENOUS | Status: AC
  Administered 2024-01-20: 2 g via INTRAVENOUS
  Filled 2024-01-19: qty 100

## 2024-01-19 MED ORDER — PROSOURCE PLUS PO LIQD
30.0000 mL | Freq: Two times a day (BID) | ORAL | Status: DC
  Administered 2024-01-21 – 2024-02-03 (×17): 30 mL via ORAL
  Filled 2024-01-19 (×22): qty 30

## 2024-01-19 MED ORDER — BISACODYL 5 MG PO TBEC: 5.0000 mg | DELAYED_RELEASE_TABLET | Freq: Once | ORAL | Status: DC

## 2024-01-19 MED ORDER — HEPARIN SODIUM (PORCINE) 1000 UNIT/ML DIALYSIS: 1000.0000 [IU] | INTRAMUSCULAR | Status: DC | PRN

## 2024-01-19 MED ORDER — NOREPINEPHRINE 4 MG/250ML-% IV SOLN
0.0000 ug/min | INTRAVENOUS | Status: AC
  Administered 2024-01-20: 2 ug/min via INTRAVENOUS
  Filled 2024-01-19: qty 250

## 2024-01-19 MED ORDER — TEMAZEPAM 15 MG PO CAPS
15.0000 mg | ORAL_CAPSULE | Freq: Once | ORAL | Status: AC | PRN
  Administered 2024-01-19: 15 mg via ORAL
  Filled 2024-01-19: qty 1

## 2024-01-19 MED ORDER — DEXMEDETOMIDINE HCL IN NACL 400 MCG/100ML IV SOLN
0.1000 ug/kg/h | INTRAVENOUS | Status: AC
  Administered 2024-01-20: 1 ug/kg/h via INTRAVENOUS
  Filled 2024-01-19: qty 100

## 2024-01-19 MED ORDER — CHLORHEXIDINE GLUCONATE 4 % EX SOLN
1.0000 | Freq: Once | CUTANEOUS | Status: AC
  Administered 2024-01-20: 1 via TOPICAL
  Filled 2024-01-19 (×2): qty 15

## 2024-01-19 MED ORDER — CHLORHEXIDINE GLUCONATE 0.12 % MT SOLN
15.0000 mL | Freq: Once | OROMUCOSAL | Status: AC
  Administered 2024-01-20: 15 mL via OROMUCOSAL
  Filled 2024-01-19 (×2): qty 15

## 2024-01-19 MED ORDER — LIDOCAINE-PRILOCAINE 2.5-2.5 % EX CREA
1.0000 | TOPICAL_CREAM | CUTANEOUS | Status: DC | PRN
Start: 2024-01-19 — End: 2024-01-19

## 2024-01-19 MED ORDER — BISACODYL 5 MG PO TBEC
5.0000 mg | DELAYED_RELEASE_TABLET | Freq: Once | ORAL | Status: AC
  Administered 2024-01-19: 5 mg via ORAL
  Filled 2024-01-19 (×2): qty 1

## 2024-01-19 NOTE — H&P (View-Only) (Signed)
  [facility]   MULTIDISCIPLINARY HEART VALVE TEAM   Pre op TAVR orders placed for tomorrow. His case is posted for 3:30PM. NPO after midnight.   Lamarr Hummer PA-C  [facility]

## 2024-01-19 NOTE — Progress Notes (Signed)
 Received patient in bed.Alert and oriented x 4. Consent verified.  Access used : Right arm avf that worked well.  Duration of treatment:  3 hours.  Net uf: 1.3 liters.  Medicine given: 0.5 MG IV Dilaudid .  Hemo comment: SBP ere dropping down near the end of the treatment thus 1.3 liters net uf.  Hand off to the patient's nurse ,back intohis room with stable condition via transporter.

## 2024-01-19 NOTE — Progress Notes (Signed)
 PROGRESS NOTE    Phillip [name]  FMW:[id number] DOB: Feb 18, 1956 DOA: 01/14/2024 PCP: [name] LABOR., MD  68/M w ESRD on HD TTS, DM-1, recent acute cholecystitis June 2025) requiring cholecystostomy tube placement, chronic HFrEF, aortic stenosis (TAVR postponed due to left third toe gangrene), PAD-s/p PCI to left tibia-fibula/angioplasty on 7/25 who was admitted by vascular surgery 8/13 for an elective left third toe amputation.  H/o significant issues with nonhealing ulceration of his left foot-progressing into a gangrenous left third toe,  underwent a angiogram of LLE with transluminal angioplasty/stent placement to the left tibioperoneal trunk on 7/25 at [facility].  Plans were to proceed with a left third toe amputation post revascularization.  However due to his significant underlying medical comorbidities, this could not be done at [facility], referred to the [facility] surgery and admitted to [facility] - 8/13 underwent left third toe amputation including metatarsal head, unfortunately bone was not healthy, no capillary bleeding at this level, patient felt to require TMA or BKA however patient declined this. - 8/15: Left foot transmetatarsal amputation  Subjective: - Feels fair, no events overnight, denies dyspnea, no fevers or chills, tolerating diet  Assessment and Plan:  Left third toe gangrene S/p amputation of left third toe 8/13, unfortunately had no bleeding during surgery suggestive of poor future healing, initially declined BKA or TMA  - Sp transmetatarsal amputation and wound VAC placement 8/15 - Continue current wound care - Currently planned for TAVR tomorrow, anticipate discharge Wednesday/Thursday  He PAD s/p angioplasty/PCI to left tibioperoneal trunk on 7/25 See above regarding left third toe tissue. Continue aspirin /Plavix /statin.   ESRD on HD TTS -Nephrology following, plan for HD today considering TAVR tomorrow Per chart review-patient was tunneled HD catheter was recently  removed on 8/6, he currently has a functioning LUE AVF   Normocytic anemia - Secondary to CKD, stable monitor   Chronic HFrEF Euvolemic Last echo 1/25 at OSH-EF 30-35% SEVERE LFLG Volume removal with hemodialysis Continue beta-blocker/Imdur .   Severe aortic stenosis -Followed by structural heart team,-plans are for TAVR on Tuesday   History of CAD s/p CABG 2003 No anginal symptoms -last cath 2/25: Severe CAD w/ all vein grafts occluded On aspirin /Plavix /statin.   Recent history of acute cholecystitis requiring cholecystostomy drain June 2025 Cholecystostomy drain remains in place Routine drain care Reviewed outpatient general surgery note-definite surgery to be done post TAVR   DM-2 CBGs are stable, continue SSI, recent A1c was 5.8   Hypothyroidism Synthroid   Trace blood in stools 8/15 - No recurrence since, restart subcu heparin    DVT prophylaxis: Heparin  subcutaneous Code Status: Full code Family Communication: None present Disposition Plan: May need SNF    Objective: Vitals:   01/19/24 0313 01/19/24 0749 01/19/24 1105 01/19/24 1152  BP:  117/65  124/63  Pulse: 80 72  70  Resp: 15 13  13   Temp: 98.1 F (36.7 C) 98.7 F (37.1 C)  98 F (36.7 C)  TempSrc: Oral Oral  Oral  SpO2: 97%  94%   Weight:      Height:        Intake/Output Summary (Last 24 hours) at 01/19/2024 1220 Last data filed at 01/19/2024 1100 Gross per 24 hour  Intake 120 ml  Output 100 ml  Net 20 ml   Filed Weights   01/14/24 1107 01/15/24 1155 01/15/24 1645  Weight: 83.5 kg 82.3 kg 80.3 kg    Examination:  General exam: Appears calm and comfortable, AO x 3, no distress Respiratory system: Clear Cardiovascular system:  S1 & S2 heard, RRR.  Systolic murmur Abd: n soft nontender, bowel sounds present Central nervous system: Alert and oriented. No focal neurological deficits. Extremities: Left foot with TMA, wound VAC, left arm AV fistula Skin: No rashes on exposed  skin Psychiatry:  Mood & affect appropriate.     Data Reviewed:   CBC: Recent Labs  Lab 01/16/24 0343 01/17/24 0320 01/17/24 1228 01/18/24 0340 01/19/24 0442  WBC 12.4* 8.3 10.5 11.8* 11.1*  HGB 10.7* 9.9* 10.7* 10.9* 10.7*  HCT 33.7* 31.3* 33.3* 33.6* 32.9*  MCV 95.7 95.4 95.1 93.6 94.0  PLT 214 204 226 217 225   Basic Metabolic Panel: Recent Labs  Lab 01/15/24 0338 01/16/24 0343 01/17/24 1228 01/18/24 0340 01/19/24 0442  NA 137 136 135 134* 133*  K 4.2 3.8 4.1 3.5 4.2  CL 94* 94* 95* 93* 93*  CO2 31 28 27 27 25   GLUCOSE 89 97 141* 120* 129*  BUN 38* 25* 48* 24* 43*  CREATININE 7.32* 5.21* 7.95* 4.91* 7.16*  CALCIUM  8.6* 8.5* 8.5* 8.6* 8.7*  MG  --   --   --   --  2.1   GFR: Estimated Creatinine Clearance: 10.8 mL/min (A) (by C-G formula based on SCr of 7.16 mg/dL (H)). Liver Function Tests: Recent Labs  Lab 01/15/24 0338  AST 19  ALT 11  ALKPHOS 142*  BILITOT 0.8  PROT 7.6  ALBUMIN  2.1*   No results for input(s): LIPASE, AMYLASE in the last 168 hours. No results for input(s): AMMONIA in the last 168 hours. Coagulation Profile: No results for input(s): INR, PROTIME in the last 168 hours. Cardiac Enzymes: No results for input(s): CKTOTAL, CKMB, CKMBINDEX, TROPONINI in the last 168 hours. BNP (last 3 results) Recent Labs    07/14/23 1412  PROBNP 11,269*   HbA1C: No results for input(s): HGBA1C in the last 72 hours. CBG: Recent Labs  Lab 01/18/24 0643 01/18/24 1256 01/18/24 1656 01/18/24 2122 01/19/24 0612  GLUCAP 133* 148* 170* 107* 123*   Lipid Profile: No results for input(s): CHOL, HDL, LDLCALC, TRIG, CHOLHDL, LDLDIRECT in the last 72 hours. Thyroid  Function Tests: No results for input(s): TSH, T4TOTAL, FREET4, T3FREE, THYROIDAB in the last 72 hours. Anemia Panel: No results for input(s): VITAMINB12, FOLATE, FERRITIN, TIBC, IRON , RETICCTPCT in the last 72 hours. Urine analysis:     Component Value Date/Time   COLORURINE YELLOW 11/07/2020 0744   APPEARANCEUR CLEAR 11/07/2020 0744   LABSPEC 1.011 11/07/2020 0744   PHURINE 5.0 11/07/2020 0744   GLUCOSEU 50 (A) 11/07/2020 0744   HGBUR SMALL (A) 11/07/2020 0744   BILIRUBINUR NEGATIVE 11/07/2020 0744   KETONESUR NEGATIVE 11/07/2020 0744   PROTEINUR 100 (A) 11/07/2020 0744   NITRITE NEGATIVE 11/07/2020 0744   LEUKOCYTESUR NEGATIVE 11/07/2020 0744   Sepsis Labs: @LABRCNTIP (procalcitonin:4,lacticidven:4)  ) Recent Results (from the past 240 hours)  Surgical pcr screen     Status: None   Collection Time: 01/16/24  2:52 AM   Specimen: Nasal Mucosa; Nasal Swab  Result Value Ref Range Status   MRSA, PCR NEGATIVE NEGATIVE Final   Staphylococcus aureus NEGATIVE NEGATIVE Final    Comment: (NOTE) The Xpert SA Assay (FDA approved for NASAL specimens in patients 12 years of age and older), is one component of a comprehensive surveillance program. It is not intended to diagnose infection nor to guide or monitor treatment. Performed at [facility], [address], [place], [place] [address]      Radiology Studies: No results found.  Scheduled Meds:  (feeding supplement) PROSource Plus  30 mL Oral BID BM   aspirin  EC  81 mg Oral Daily   atorvastatin   20 mg Oral Daily   carvedilol   3.125 mg Oral BID   Chlorhexidine  Gluconate Cloth  6 each Topical Q0600   clopidogrel   75 mg Oral Daily   heparin   5,000 Units Subcutaneous Q8H   insulin  aspart  0-6 Units Subcutaneous TID WC   isosorbide  mononitrate  120 mg Oral Daily   levothyroxine   50 mcg Oral Daily   senna  1 tablet Oral BID   sevelamer  carbonate  800 mg Oral TID WC   tamsulosin   0.4 mg Oral QPC breakfast   Continuous Infusions:   LOS: 5 days    Time spent:    Sigurd Pac, MD [facility]   01/19/2024, 12:20 PM

## 2024-01-19 NOTE — Progress Notes (Signed)
  [facility]   MULTIDISCIPLINARY HEART VALVE TEAM   Pre op TAVR orders placed for tomorrow. His case is posted for 3:30PM. NPO after midnight.   Lamarr Hummer PA-C  [facility]

## 2024-01-19 NOTE — Progress Notes (Signed)
 Pt previously documented burst of Afib/ svt have increased in frequency  to every couple of minutes. HR will increase for the 80's to 150'160's and last up to 30 seconds and the return to 80's. Pt denies that he can feel it and is asymptomatic.  Spoke with Dr. Charlton who ordered to give morning does of coreg  now, and to get magnesium  level

## 2024-01-19 NOTE — Progress Notes (Addendum)
  Progress Note    01/19/2024 7:26 AM 3 Days Post-Op  Subjective:  says his left foot hurts a little bit. He has walked since surgery    Vitals:   01/18/24 2143 01/19/24 0313  BP: (!) 106/47   Pulse: 88 80  Resp: 14 15  Temp: 98.6 F (37 C) 98.1 F (36.7 C)  SpO2: 93% 97%    Physical Exam: General:  resting comfortably, NAD Lungs:  nonlabored Incisions:  left TMA with prevena with good seal CBC    Component Value Date/Time   WBC 11.1 (H) 01/19/2024 0442   RBC 3.50 (L) 01/19/2024 0442   HGB 10.7 (L) 01/19/2024 0442   HGB 11.5 (L) 05/16/2023 1429   HCT 32.9 (L) 01/19/2024 0442   HCT 35.3 (L) 05/16/2023 1429   PLT 225 01/19/2024 0442   PLT 144 (L) 05/16/2023 1429   MCV 94.0 01/19/2024 0442   MCV 90 05/16/2023 1429   MCH 30.6 01/19/2024 0442   MCHC 32.5 01/19/2024 0442   RDW 14.9 01/19/2024 0442   RDW 13.5 05/16/2023 1429   LYMPHSABS 0.7 11/11/2023 1930   LYMPHSABS 0.7 05/16/2023 1429   MONOABS 0.8 11/11/2023 1930   EOSABS 0.0 11/11/2023 1930   EOSABS 0.1 05/16/2023 1429   BASOSABS 0.0 11/11/2023 1930   BASOSABS 0.0 05/16/2023 1429    BMET    Component Value Date/Time   NA 133 (L) 01/19/2024 0442   NA 145 (H) 07/14/2023 1412   NA 136 08/30/2013 1025   K 4.2 01/19/2024 0442   K 3.7 08/30/2013 1025   CL 93 (L) 01/19/2024 0442   CL 101 08/30/2013 1025   CO2 25 01/19/2024 0442   CO2 31 08/30/2013 1025   GLUCOSE 129 (H) 01/19/2024 0442   GLUCOSE 310 (H) 08/30/2013 1025   BUN 43 (H) 01/19/2024 0442   BUN 97 (HH) 07/14/2023 1412   BUN 17 08/30/2013 1025   CREATININE 7.16 (H) 01/19/2024 0442   CREATININE 1.15 08/30/2013 1025   CALCIUM  8.7 (L) 01/19/2024 0442   CALCIUM  9.4 08/30/2013 1025   GFRNONAA 8 (L) 01/19/2024 0442   GFRNONAA >60 08/30/2013 1025   GFRAA 27 (L) 02/22/2020 0539   GFRAA >60 08/30/2013 1025    INR    Component Value Date/Time   INR 1.3 (H) 11/12/2023 1011     Intake/Output Summary (Last 24 hours) at 01/19/2024 0726 Last data  filed at 01/18/2024 1930 Gross per 24 hour  Intake 120 ml  Output 0 ml  Net 120 ml      Assessment/Plan:  68 y.o. male is 3 days post op, s/p: left TMA   -He is doing okay this morning. He has some expected post op pain in the left foot -Left TMA with prevena vac with good seal. We will keep this on for a couple more days for moisture wicking -He is mobilizing well in his post op shoe -Continue pain control as needed and mobility as tolerated   Ahmed Holster, PA-C [facility] [phone] 01/19/2024 7:26 AM  I agree with the above.  Patient seen and examined in dialysis.  Plan is for TAVR tomorrow.  We will remove his Prevena wound VAC in the next few days.  [name]

## 2024-01-19 NOTE — Progress Notes (Signed)
 Contacted [facility] to advise staff that pt remains hospitalized and for a procedure tomorrow. Will contact clinic once pt's d/c date is confirmed. Will assist as needed.   Randine Mungo Dialysis Navigator [phone]

## 2024-01-19 NOTE — Progress Notes (Signed)
 [facility] Progress Note   Subjective:  Seen in room. Having some pain at L foot this morning. For dialysis today.   Objective Vitals:   01/19/24 0313 01/19/24 0749 01/19/24 1105 01/19/24 1152  BP:  117/65  124/63  Pulse: 80 72  70  Resp: 15 13  13   Temp: 98.1 F (36.7 C) 98.7 F (37.1 C)  98 F (36.7 C)  TempSrc: Oral Oral  Oral  SpO2: 97%  94%   Weight:      Height:       Physical Exam General: Chronically ill appearing male in NAD Heart: RRR Lungs: nml WOB on RA Abdomen: soft, NTND Extremities: no LE edema, L TMA w/wound vac in place Dialysis Access: LU AVF +b/t   Filed Weights   01/14/24 1107 01/15/24 1155 01/15/24 1645  Weight: 83.5 kg 82.3 kg 80.3 kg    Intake/Output Summary (Last 24 hours) at 01/19/2024 1155 Last data filed at 01/19/2024 1100 Gross per 24 hour  Intake 120 ml  Output 100 ml  Net 20 ml    Additional Objective Labs: Basic Metabolic Panel: Recent Labs  Lab 01/17/24 1228 01/18/24 0340 01/19/24 0442  NA 135 134* 133*  K 4.1 3.5 4.2  CL 95* 93* 93*  CO2 27 27 25   GLUCOSE 141* 120* 129*  BUN 48* 24* 43*  CREATININE 7.95* 4.91* 7.16*  CALCIUM  8.5* 8.6* 8.7*   Liver Function Tests: Recent Labs  Lab 01/15/24 0338  AST 19  ALT 11  ALKPHOS 142*  BILITOT 0.8  PROT 7.6  ALBUMIN  2.1*   CBC: Recent Labs  Lab 01/16/24 0343 01/17/24 0320 01/17/24 1228 01/18/24 0340 01/19/24 0442  WBC 12.4* 8.3 10.5 11.8* 11.1*  HGB 10.7* 9.9* 10.7* 10.9* 10.7*  HCT 33.7* 31.3* 33.3* 33.6* 32.9*  MCV 95.7 95.4 95.1 93.6 94.0  PLT 214 204 226 217 225   CBG: Recent Labs  Lab 01/18/24 0643 01/18/24 1256 01/18/24 1656 01/18/24 2122 01/19/24 0612  GLUCAP 133* 148* 170* 107* 123*    Medications:   aspirin  EC  81 mg Oral Daily   atorvastatin   20 mg Oral Daily   carvedilol   3.125 mg Oral BID   Chlorhexidine  Gluconate Cloth  6 each Topical Q0600   clopidogrel   75 mg Oral Daily   heparin   5,000 Units Subcutaneous Q8H    insulin  aspart  0-6 Units Subcutaneous TID WC   isosorbide  mononitrate  120 mg Oral Daily   levothyroxine   50 mcg Oral Daily   senna  1 tablet Oral BID   sevelamer  carbonate  800 mg Oral TID WC   tamsulosin   0.4 mg Oral QPC breakfast    Dialysis Orders: [facility] HD TTS - No heparin  - Left AVF (in use for 2 weeks) - EDW 81 kg 4 hr 350/800 2K/2.5 Ca Bicarb 39 Mircera: 50 mcg ordered - but has not received any ESA yet No VDRA Renvela  800 mg TID WC   Assessment/Plan:  Left third toe gangrene: 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 Then TMA completed on 01/17/24 by Dr. Serene.  PAD - s/p angioplasty/PCI to left tibioperoneal trunk on 7/25.  ESRD:  HD TTS. Next HD on 01/19/24. Plan for next HD on Monday due to TAVR scheduled on Tuesday. Then resume regular schedule on Thursday.  Hypertension/volume: BP in goal. On carvedilol  3.125mg  BID - hold in AM on HD days.  Use albumin  with HD for BP support.  If needed midodrine can be added.  Does not appear grossly overloaded.  Plan for UF as tolerated.  Chronic HFrEF: Continue volume removal with HD. Continue beta blocker/Imdur  per primary.  Severe Aortic Stenosis: patient is followed by the structural heart team. Planned TAVR, but was delayed due to his necrotic toe.  Now planned for Tuesday.  Will schedule HD on Monday to optimize volume prior to surgery.   Recent acute cholecystitis requiring cholecystostomy drain 6/25: drain is in place.   Anemia:Hgb 10.9.  No indication for ESA at this time.   Metabolic bone disease: restarted home Sevelamer  800 TID WC. Corrected calcium  in goal. Check phos. Not on VDRA at his OP clinic.   Nutrition:  Albumin  low. Add supp. On renal diet with fluid restriction  Phillip Ronnald Acosta PA-C  [facility] 01/19/2024,11:56 AM

## 2024-01-20 ENCOUNTER — Inpatient Hospital Stay (HOSPITAL_COMMUNITY)

## 2024-01-20 ENCOUNTER — Inpatient Hospital Stay (HOSPITAL_COMMUNITY): Admitting: Certified Registered Nurse Anesthetist

## 2024-01-20 ENCOUNTER — Other Ambulatory Visit (HOSPITAL_COMMUNITY)

## 2024-01-20 ENCOUNTER — Encounter (HOSPITAL_COMMUNITY): Payer: Self-pay | Admitting: Internal Medicine

## 2024-01-20 ENCOUNTER — Encounter (HOSPITAL_COMMUNITY)
Admission: RE | Disposition: A | Discharge status: Rehabilitation Facility | Payer: Self-pay | Source: Home / Self Care | Attending: Internal Medicine

## 2024-01-20 ENCOUNTER — Inpatient Hospital Stay (HOSPITAL_COMMUNITY): Admission: RE | Admit: 2024-01-20 | Source: Home / Self Care | Admitting: Internal Medicine

## 2024-01-20 ENCOUNTER — Other Ambulatory Visit: Payer: Self-pay

## 2024-01-20 DIAGNOSIS — Z952 Presence of prosthetic heart valve: Principal | ICD-10-CM

## 2024-01-20 DIAGNOSIS — E1152 Type 2 diabetes mellitus with diabetic peripheral angiopathy with gangrene: Secondary | ICD-10-CM | POA: Diagnosis not present

## 2024-01-20 DIAGNOSIS — I350 Nonrheumatic aortic (valve) stenosis: Secondary | ICD-10-CM

## 2024-01-20 DIAGNOSIS — Z006 Encounter for examination for normal comparison and control in clinical research program: Secondary | ICD-10-CM

## 2024-01-20 DIAGNOSIS — N186 End stage renal disease: Secondary | ICD-10-CM | POA: Diagnosis not present

## 2024-01-20 DIAGNOSIS — I2510 Atherosclerotic heart disease of native coronary artery without angina pectoris: Secondary | ICD-10-CM

## 2024-01-20 DIAGNOSIS — K810 Acute cholecystitis: Secondary | ICD-10-CM | POA: Diagnosis not present

## 2024-01-20 DIAGNOSIS — I96 Gangrene, not elsewhere classified: Secondary | ICD-10-CM | POA: Diagnosis not present

## 2024-01-20 DIAGNOSIS — I5042 Chronic combined systolic (congestive) and diastolic (congestive) heart failure: Secondary | ICD-10-CM

## 2024-01-20 DIAGNOSIS — E872 Acidosis, unspecified: Secondary | ICD-10-CM | POA: Diagnosis not present

## 2024-01-20 DIAGNOSIS — I110 Hypertensive heart disease with heart failure: Secondary | ICD-10-CM

## 2024-01-20 HISTORY — PX: INTRAOPERATIVE TRANSTHORACIC ECHOCARDIOGRAM: SHX6523

## 2024-01-20 LAB — ECHOCARDIOGRAM LIMITED
AR max vel: 4.9 cm2
AV Area VTI: 4.57 cm2
AV Area mean vel: 4.8 cm2
AV Mean grad: 3 mmHg
AV Peak grad: 5.9 mmHg
Ao pk vel: 1.21 m/s
Height: 72 in
P 1/2 time: 446 ms
Single Plane A4C EF: 35.4 %
Weight: 2652.57 [oz_av]

## 2024-01-20 LAB — BASIC METABOLIC PANEL WITH GFR
Anion gap: 17 — ABNORMAL HIGH (ref 5–15)
BUN: 55 mg/dL — ABNORMAL HIGH (ref 8–23)
CO2: 24 mmol/L (ref 22–32)
Calcium: 9.1 mg/dL (ref 8.9–10.3)
Chloride: 93 mmol/L — ABNORMAL LOW (ref 98–111)
Creatinine, Ser: 8.78 mg/dL — ABNORMAL HIGH (ref 0.61–1.24)
GFR, Estimated: 6 mL/min — ABNORMAL LOW (ref >60)
Glucose, Bld: 105 mg/dL — ABNORMAL HIGH (ref 70–99)
Potassium: 4.4 mmol/L (ref 3.5–5.1)
Sodium: 134 mmol/L — ABNORMAL LOW (ref 135–145)

## 2024-01-20 LAB — POCT ACTIVATED CLOTTING TIME: Activated Clotting Time: 273 s

## 2024-01-20 LAB — POCT I-STAT, CHEM 8
BUN: 58 mg/dL — ABNORMAL HIGH (ref 8–23)
Calcium, Ion: 1.02 mmol/L — ABNORMAL LOW (ref 1.15–1.40)
Chloride: 86 mmol/L — ABNORMAL LOW (ref 98–111)
Creatinine, Ser: 8.4 mg/dL — ABNORMAL HIGH (ref 0.61–1.24)
Glucose, Bld: 116 mg/dL — ABNORMAL HIGH (ref 70–99)
HCT: 34 % — ABNORMAL LOW (ref 39.0–52.0)
Hemoglobin: 11.6 g/dL — ABNORMAL LOW (ref 13.0–17.0)
Potassium: 4.2 mmol/L (ref 3.5–5.1)
Sodium: 124 mmol/L — ABNORMAL LOW (ref 135–145)
TCO2: 22 mmol/L (ref 22–32)

## 2024-01-20 LAB — CBC
HCT: 36.1 % — ABNORMAL LOW (ref 39.0–52.0)
Hemoglobin: 11.6 g/dL — ABNORMAL LOW (ref 13.0–17.0)
MCH: 30.6 pg (ref 26.0–34.0)
MCHC: 32.1 g/dL (ref 30.0–36.0)
MCV: 95.3 fL (ref 80.0–100.0)
Platelets: 221 K/uL (ref 150–400)
RBC: 3.79 MIL/uL — ABNORMAL LOW (ref 4.22–5.81)
RDW: 14.8 % (ref 11.5–15.5)
WBC: 10.7 K/uL — ABNORMAL HIGH (ref 4.0–10.5)
nRBC: 0 % (ref 0.0–0.2)

## 2024-01-20 LAB — GLUCOSE, CAPILLARY
Glucose-Capillary: 113 mg/dL — ABNORMAL HIGH (ref 70–99)
Glucose-Capillary: 136 mg/dL — ABNORMAL HIGH (ref 70–99)
Glucose-Capillary: 101 mg/dL — ABNORMAL HIGH (ref 70–99)
Glucose-Capillary: 102 mg/dL — ABNORMAL HIGH (ref 70–99)

## 2024-01-20 SURGERY — TRANSCATHETER AORTIC VALVE REPLACEMENT, TRANSFEMORAL (CATHLAB)
Anesthesia: Monitor Anesthesia Care

## 2024-01-20 MED ORDER — PROTAMINE SULFATE 10 MG/ML IV SOLN
INTRAVENOUS | Status: DC | PRN
  Administered 2024-01-20: 120 mg via INTRAVENOUS

## 2024-01-20 MED ORDER — LIDOCAINE HCL (PF) 1 % IJ SOLN
INTRAMUSCULAR | Status: DC | PRN
  Administered 2024-01-20 (×2): 5 mL

## 2024-01-20 MED ORDER — SODIUM CHLORIDE 0.9% FLUSH
3.0000 mL | Freq: Two times a day (BID) | INTRAVENOUS | Status: DC
  Administered 2024-01-21 – 2024-02-05 (×31): 3 mL via INTRAVENOUS

## 2024-01-20 MED ORDER — CHLORHEXIDINE GLUCONATE 0.12 % MT SOLN
OROMUCOSAL | Status: AC
  Administered 2024-01-20: 15 mL via OROMUCOSAL
  Filled 2024-01-20: qty 15

## 2024-01-20 MED ORDER — FENTANYL CITRATE (PF) 100 MCG/2ML IJ SOLN
INTRAMUSCULAR | Status: AC
Start: 2024-01-20 — End: 2024-01-20
  Filled 2024-01-20: qty 2

## 2024-01-20 MED ORDER — CEFAZOLIN SODIUM-DEXTROSE 2-4 GM/100ML-% IV SOLN: 2.0000 g | Freq: Three times a day (TID) | INTRAVENOUS | Status: DC

## 2024-01-20 MED ORDER — HEPARIN SODIUM (PORCINE) 1000 UNIT/ML IJ SOLN
INTRAMUSCULAR | Status: DC | PRN
  Administered 2024-01-20: 12000 [IU] via INTRAVENOUS

## 2024-01-20 MED ORDER — CLEVIDIPINE BUTYRATE 0.5 MG/ML IV EMUL
INTRAVENOUS | Status: DC | PRN
  Administered 2024-01-20: 18 mg/h via INTRAVENOUS

## 2024-01-20 MED ORDER — SODIUM CHLORIDE 0.9% FLUSH: 3.0000 mL | INTRAVENOUS | Status: DC | PRN

## 2024-01-20 MED ORDER — SODIUM CHLORIDE 0.9 % IV SOLN: 250.0000 mL | INTRAVENOUS | Status: DC | PRN

## 2024-01-20 MED ORDER — FENTANYL CITRATE (PF) 100 MCG/2ML IJ SOLN
INTRAMUSCULAR | Status: DC | PRN
  Administered 2024-01-20 (×2): 25 ug via INTRAVENOUS

## 2024-01-20 MED ORDER — NOREPINEPHRINE 4 MG/250ML-% IV SOLN
0.0000 ug/min | INTRAVENOUS | Status: DC
  Filled 2024-01-20: qty 250

## 2024-01-20 MED ORDER — METOPROLOL TARTRATE 5 MG/5ML IV SOLN
INTRAVENOUS | Status: AC
  Administered 2024-01-20: 5 mg
  Filled 2024-01-20: qty 5

## 2024-01-20 MED ORDER — SODIUM CHLORIDE 0.9 % IV SOLN: INTRAVENOUS | Status: DC | PRN

## 2024-01-20 SURGICAL SUPPLY — 26 items
BAG SNAP BAND KOVER 36X36 (MISCELLANEOUS) ×4 IMPLANT
CABLE SURGICAL S-101-97-12 (CABLE) IMPLANT
CATH COMMANDER DELIVERY SYS 29 (CATHETERS) IMPLANT
CATH DIAG 6FR PIGTAIL ANGLED (CATHETERS) IMPLANT
CATH INFINITI 5FR ANG PIGTAIL (CATHETERS) IMPLANT
CATH INFINITI 6F AL1 (CATHETERS) IMPLANT
CLOSURE MYNX CONTROL 6F/7F (Vascular Products) IMPLANT
CLOSURE PERCLOSE PROSTYLE (VASCULAR PRODUCTS) IMPLANT
CRIMPER (MISCELLANEOUS) IMPLANT
DEVICE INFLATION ATRION QL38 (MISCELLANEOUS) IMPLANT
KIT SAPIAN 3 ULTRA RESILIA 29 (Valve) IMPLANT
PACK CARDIAC CATHETERIZATION (CUSTOM PROCEDURE TRAY) ×2 IMPLANT
PAD SORBX EP SHIELD 16.5X12 (MISCELLANEOUS) IMPLANT
SET ATX-X65L (MISCELLANEOUS) IMPLANT
SHEATH INTRODUCER SET 29 (SHEATH) IMPLANT
SHEATH PINNACLE 6F 10CM (SHEATH) IMPLANT
SHEATH PINNACLE 8F 10CM (SHEATH) IMPLANT
STOPCOCK MORSE 400PSI 3WAY (MISCELLANEOUS) ×4 IMPLANT
SYR CONTROL 10ML ANGIOGRAPHIC (SYRINGE) IMPLANT
TUBING CIL FLEX 10 FLL-RA (TUBING) IMPLANT
WIRE AMPLATZ SS-J .035X180CM (WIRE) IMPLANT
WIRE EMERALD 3MM-J .035X150CM (WIRE) IMPLANT
WIRE EMERALD 3MM-J .035X260CM (WIRE) IMPLANT
WIRE EMERALD ST .035X260CM (WIRE) IMPLANT
WIRE MICRO SET SILHO 5FR 7 (SHEATH) IMPLANT
WIRE SAFARI SM CURVE 275 (WIRE) IMPLANT

## 2024-01-20 NOTE — Progress Notes (Signed)
 PHARMACY NOTE:  ANTIMICROBIAL RENAL DOSAGE ADJUSTMENT  Current antimicrobial regimen includes a mismatch between antimicrobial dosage and estimated renal function.  As per policy approved by the Pharmacy & Therapeutics and Medical Executive Committees, the antimicrobial dosage will be adjusted accordingly.  Current antimicrobial dosage:  Ancef  2gm IV q8h  Indication: surgical prophylaxis  Renal Function:  Estimated Creatinine Clearance: 9 mL/min (A) (by C-G formula based on SCr of 8.4 mg/dL (H)). [x]      On intermittent HD     Antimicrobial dosage has been changed to:  No post-op Ancef  needed. Pre-op  Ancef  will cover 24h post-op period.   Thank you for allowing pharmacy to be a part of this patient's care.  Vito Ralph, PharmD, BCPS Please see amion for complete clinical pharmacist phone list 01/20/2024 7:07 PM

## 2024-01-20 NOTE — Progress Notes (Signed)
  Echocardiogram 2D Echocardiogram has been performed.  Phillip [name] 01/20/2024, 5:50 PM

## 2024-01-20 NOTE — Interval H&P Note (Signed)
 History and Physical Interval Note:  01/20/2024 4:27 PM  Phillip [name]  has presented today for surgery, with the diagnosis of Severe Aortic Stenosis.  The various methods of treatment have been discussed with the patient and family. After consideration of risks, benefits and other options for treatment, the patient has consented to  Procedure(s): Transcatheter Aortic Valve Replacement, Transfemoral (Left) ECHOCARDIOGRAM, TRANSTHORACIC (N/A) as a surgical intervention.  The patient's history has been reviewed, patient examined, no change in status, stable for surgery.  I have reviewed the patient's chart and labs.  Questions were answered to the patient's satisfaction.     Roshon Duell K Keanu Frickey

## 2024-01-20 NOTE — Anesthesia Preprocedure Evaluation (Signed)
 Anesthesia Evaluation  Patient identified by MRN, date of birth, ID band Patient awake    Reviewed: Allergy & Precautions, H&P , NPO status , Patient's Chart, lab work & pertinent test results, reviewed documented beta blocker date and time   Airway Mallampati: II  TM Distance: >3 FB Neck ROM: Full    Dental no notable dental hx. (+) Edentulous Upper, Edentulous Lower, Dental Advisory Given   Pulmonary asthma , sleep apnea , former smoker   Pulmonary exam normal breath sounds clear to auscultation       Cardiovascular hypertension, Pt. on medications and Pt. on home beta blockers + CAD, + Past MI, + Peripheral Vascular Disease and +CHF  + Valvular Problems/Murmurs AS and MR  Rhythm:Regular Rate:Normal + Systolic murmurs An echocardiogram in 06/2023 (outside films) showed EF 30-35%, severe LFLG AS with mean grad 28 mmHg, Vmax 3.1 m/s, AVA 0.9cm2, SVI 23. L/RHC on 08/01/23 showed showed severe native three-vessel disease with all 3 vein grafts occluded.  There was a patent LIMA to the LAD with collaterals to the distal right coronary artery.  There was moderate pulmonary hypertension at 66/21 with a mean of 39.    Neuro/Psych negative neurological ROS  negative psych ROS   GI/Hepatic Neg liver ROS,GERD  ,,  Endo/Other  diabetes, Insulin  DependentHypothyroidism    Renal/GU ESRF and DialysisRenal disease  negative genitourinary   Musculoskeletal  (+) Arthritis , Osteoarthritis,    Abdominal   Peds  Hematology  (+) Blood dyscrasia, anemia   Anesthesia Other Findings   Reproductive/Obstetrics negative OB ROS                              Anesthesia Physical Anesthesia Plan  ASA: 4  Anesthesia Plan: MAC   Post-op Pain Management: Minimal or no pain anticipated   Induction: Intravenous  PONV Risk Score and Plan: Ondansetron  and Treatment may vary due to age or medical condition  Airway  Management Planned: Natural Airway and Simple Face Mask  Additional Equipment:   Intra-op Plan:   Post-operative Plan:   Informed Consent: I have reviewed the patients History and Physical, chart, labs and discussed the procedure including the risks, benefits and alternatives for the proposed anesthesia with the patient or authorized representative who has indicated his/her understanding and acceptance.     Dental advisory given  Plan Discussed with: CRNA  Anesthesia Plan Comments:          Anesthesia Quick Evaluation

## 2024-01-20 NOTE — Progress Notes (Signed)
 IP rehab admissions - I met with patient and his wife and grand daughter at the bedside.  They are interested in CIR once patient is medically ready.  Wife stays at the bedside and is not currently working.  Noted patient to OR today.  Will follow up again once patient is again participating with therapies post op.  [phone]

## 2024-01-20 NOTE — Progress Notes (Signed)
 [facility] Progress Note   Subjective:  Seen in room. Completed dialysis yesterday with net UF 1.3L. No issues with dialysis. To OR for TAVR today.    Objective Vitals:   01/19/24 2302 01/20/24 0257 01/20/24 0800 01/20/24 0843  BP: 109/71 (!) 149/78 111/68 105/71  Pulse:  89 80 75  Resp: 14 12 20    Temp: 98.2 F (36.8 C) 98 F (36.7 C) 98.3 F (36.8 C)   TempSrc: Oral Oral Oral   SpO2:  97%    Weight:      Height:       Physical Exam General: Alert, sitting up in bed, nad  Heart: RRR Lungs: Clear  Abdomen: soft, NTND Extremities: no LE edema, L TMA w/wound vac in place Dialysis Access: LU AVF +b/t   Filed Weights   01/15/24 1645 01/19/24 1241 01/19/24 1626  Weight: 80.3 kg 76.8 kg 75.2 kg    Intake/Output Summary (Last 24 hours) at 01/20/2024 0923 Last data filed at 01/19/2024 2107 Gross per 24 hour  Intake 120 ml  Output 1400 ml  Net -1280 ml    Additional Objective Labs: Basic Metabolic Panel: Recent Labs  Lab 01/19/24 0442 01/19/24 1844 01/20/24 0417  NA 133* 134* 134*  K 4.2 4.5 4.4  CL 93* 92* 93*  CO2 25 26 24   GLUCOSE 129* 105* 105*  BUN 43* 49* 55*  CREATININE 7.16* 8.20* 8.78*  CALCIUM  8.7* 9.0 9.1   Liver Function Tests: Recent Labs  Lab 01/15/24 0338 01/19/24 1844  AST 19 22  ALT 11 <5  ALKPHOS 142* 161*  BILITOT 0.8 0.8  PROT 7.6 9.4*  ALBUMIN  2.1* 2.5*   CBC: Recent Labs  Lab 01/17/24 0320 01/17/24 1228 01/18/24 0340 01/19/24 0442 01/20/24 0417  WBC 8.3 10.5 11.8* 11.1* 10.7*  HGB 9.9* 10.7* 10.9* 10.7* 11.6*  HCT 31.3* 33.3* 33.6* 32.9* 36.1*  MCV 95.4 95.1 93.6 94.0 95.3  PLT 204 226 217 225 221   CBG: Recent Labs  Lab 01/19/24 0612 01/19/24 1155 01/19/24 1749 01/19/24 2120 01/20/24 0618  GLUCAP 123* 116* 96 124* 113*    Medications:   (feeding supplement) PROSource Plus  30 mL Oral BID BM   aspirin  EC  81 mg Oral Daily   atorvastatin   20 mg Oral Daily   carvedilol   3.125 mg Oral BID    clopidogrel   75 mg Oral Daily   heparin   5,000 Units Subcutaneous Q8H   insulin  aspart  0-6 Units Subcutaneous TID WC   isosorbide  mononitrate  120 mg Oral Daily   levothyroxine   50 mcg Oral Daily   sevelamer  carbonate  800 mg Oral TID WC   tamsulosin   0.4 mg Oral QPC breakfast    Dialysis Orders: [facility] HD TTS - No heparin  - Left AVF (in use for 2 weeks) - EDW 81 kg 4 hr 350/800 2K/2.5 Ca Bicarb 39 Mircera: 50 mcg ordered - but has not received any ESA yet No VDRA Renvela  800 mg TID WC   Assessment/Plan: PAD/ Left third toe gangrene: 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 Then TMA completed on 01/17/24 by Dr. Serene.  Severe Aortic Stenosis:For TAVR today  ESRD:  [facility] T[facility]. Had HD off schedule Monday to accommodate for surgery on Tues. Resume regular schedule on Thursday. Next HD 8/21.  Hypertension/volume: BP in goal. On carvedilol  3.125mg  BID - hold in AM on HD days.  Use albumin  with HD for BP support. UF as able.  Chronic HFrEF: Continue  volume removal with HD.  Recent acute cholecystitis requiring cholecystostomy drain 6/25: drain is in place.  Anemia: Hgb 10-11.  No indication for ESA at this time.  Metabolic bone disease: restarted home Sevelamer  800 TID WC. Corrected calcium  in goal. Check phos. Not on VDRA at his OP clinic.  DMT2: Insulin  per primary.  Nutrition:  Albumin  low. Add supp. On renal diet with fluid restriction  Phillip Ronnald Acosta PA-C [facility] 01/20/2024,9:23 AM

## 2024-01-20 NOTE — Progress Notes (Incomplete)
 [facility]   MULTIDISCIPLINARY HEART VALVE TEAM  Patient Name: [name] Date of Encounter: 01/21/2024  Admit date: 01/14/2024  Primary Care Provider: [name] LABOR., MD [facility] Cardiologist: Jennifer JONELLE Crape, MD  [facility] Electrophysiologist:  None   Hospital Problem List     Principal Problem:   Gangrene of toe of left foot [facility]) Active Problems:   Atherosclerosis of native arteries of the extremities with ulceration (HCC)   CAD (coronary artery disease)   Sleep apnea   Chronic combined systolic and diastolic CHF (congestive heart failure) (HCC)   Anemia of chronic disease   Coronary artery disease   Diabetes mellitus without complication (HCC)   Peripheral vascular disease (HCC)   Essential hypertension   ESRD on dialysis (HCC)   S/P TAVR (transcatheter aortic valve replacement)     Subjective   Very thankful for care. No complaints.   Inpatient Medications    Scheduled Meds:  (feeding supplement) PROSource Plus  30 mL Oral BID BM   aspirin  EC  81 mg Oral Daily   atorvastatin   20 mg Oral Daily   carvedilol   3.125 mg Oral BID   clopidogrel   75 mg Oral Daily   heparin   5,000 Units Subcutaneous Q8H   insulin  aspart  0-6 Units Subcutaneous TID WC   isosorbide  mononitrate  120 mg Oral Daily   levothyroxine   50 mcg Oral Daily   sevelamer  carbonate  800 mg Oral TID WC   sodium chloride  flush  3 mL Intravenous Q12H   tamsulosin   0.4 mg Oral QPC breakfast   Continuous Infusions:  sodium chloride      norepinephrine  (LEVOPHED ) Adult infusion     PRN Meds: sodium chloride , acetaminophen  **OR** acetaminophen , albuterol , HYDROmorphone  (DILAUDID ) injection, menthol -cetylpyridinium, nitroGLYCERIN , oxyCODONE , polyethylene glycol, sodium chloride  flush   Vital Signs    Vitals:   01/21/24 0335 01/21/24 0500 01/21/24 0757 01/21/24 0853  BP: 121/72  113/72 128/75  Pulse: 77  76 73  Resp: 18  16 19   Temp: 98 F (36.7 C)  98.1 F (36.7  C)   TempSrc: Oral  Oral   SpO2: 99%  100% 100%  Weight:  75.1 kg    Height:        Intake/Output Summary (Last 24 hours) at 01/21/2024 0943 Last data filed at 01/20/2024 2018 Gross per 24 hour  Intake 740 ml  Output --  Net 740 ml   Filed Weights   01/19/24 1241 01/19/24 1626 01/21/24 0500  Weight: 76.8 kg 75.2 kg 75.1 kg    Physical Exam    GEN: Well nourished, well developed, in no acute distress.  HEENT: Grossly normal.  Neck: Supple, no JVD,  or masses. Cardiac: RRR, no murmurs, rubs, or gallops. No clubbing, cyanosis, edema.   Respiratory:  Respirations regular and unlabored, clear to auscultation bilaterally. GI: Soft, nontender, nondistended, BS + x 4. Chole drain in place.  MS: no deformity or atrophy. S/p L TMA with wound vac Skin: warm and dry, no rash. Neuro:  Strength and sensation are intact. Psych: AAOx3.  Normal affect.  Labs    CBC Recent Labs    01/20/24 0417 01/20/24 1752 01/21/24 0331  WBC 10.7*  --  11.0*  HGB 11.6* 11.6* 11.2*  HCT 36.1* 34.0* 35.0*  MCV 95.3  --  94.9  PLT 221  --  197   Basic Metabolic Panel Recent Labs    91/81/74 0442 01/19/24 1844 01/20/24 0417 01/20/24 1752 01/21/24 0331  NA 133*   < > 134* 124* 131*  K 4.2   < > 4.4 4.2 4.8  CL 93*   < > 93* 86* 92*  CO2 25   < > 24  --  22  GLUCOSE 129*   < > 105* 116* 109*  BUN 43*   < > 55* 58* 72*  CREATININE 7.16*   < > 8.78* 8.40* 10.74*  CALCIUM  8.7*   < > 9.1  --  8.5*  MG 2.1  --   --   --  2.2   < > = values in this interval not displayed.   Liver Function Tests Recent Labs    01/19/24 1844  AST 22  ALT <5  ALKPHOS 161*  BILITOT 0.8  PROT 9.4*  ALBUMIN  2.5*   Telemetry    Sinus with wide QRS, PVCs and 2 short runs of NSVT, 4 beats and 10 beats - Personally Reviewed  ECG    Sinus with LVH and new LBBB, HR 78- Personally Reviewed  Patient Profile     Phillip [name] is a 68 y.o. male with a hx of HTN, HLD, ESRD on HD (T,Th,Sat started March  2025), PAD s/p left tibioperoneal, peroneal, and popliteal drug-coated balloon angioplasty 09/2022 and left big toe amputation, obesity (BMI 31), DMT2, HFrEF (EF 30-35%), IDDM, CAD s/p CABG x4 in 2003 (all vein grafts occluded; LIMA to LAD patent cath 2025), cholelithiasis (lap chole deferred until after valve replacement s/p drain), severe AS in TAVR work up who presented to [facility] on 01/14/24 for elective gangrenous toe amputation that required admission for left TMA with Prevena VAC placement. Pt kept inpatient for planned TAVR.  Assessment & Plan    Severe AS:  -- S/p successful TAVR with a 29 mm Edwards Sapien 3 Ultra Resilia THV via the TF approach on 01/20/24.  -- Post operative echo completed but pending formal read. -- Groin sites are stable.  -- ECG/tele with new LBBB but and no high grade heart block.Will discharge home with a Zio AT. -- Continue Asprin 81 mg daily and Plavix  75mg  daily.  -- Met with cardiac rehab to discuss CRP phase II.  -- Cleared for discharge when cleared by medical, renal and vascular team.  -- Structural heart follow up arranged for next week; however, there was discussion about him going to CIR.  HFrEF: -- EF 30-35% on intra-op echo.  -- Continue Coreg  3.125mg  BID. -- BP has limited GDMT in the past but will try to add on additional agents as BP allows.  -- Volume management per HD.    New LBBB: -- Noted after TAVR. Will require a Zio AT at discharge to monitor for HAVB.   ESRD on HD: -- T, Thu, Sat.    HTN: -- BP well controlled.  -- Continue Coreg  3.125mg  BID and Imdur  120 mg daily.   Cholecystitis: -- Drain in place and stable. -- Will require surgery in the future and will discuss timing in the outpatient setting.   NSVT: -- Continue Coreg    Signed, Lamarr Hummer, PA-C  01/21/2024, 9:43 AM  Pager [phone]   ATTENDING ATTESTATION:  After conducting a review of all available clinical information with the care team, interviewing the  patient, and performing a physical exam, I agree with the findings and plan described in this note.   GEN: No acute distress.   HEENT:  MMM, no JVD, no scleral icterus Cardiac: RRR, no murmurs, rubs, or gallops.  Respiratory: Clear  to auscultation bilaterally. GI: Soft, nontender, non-distended  MS: No edema; No deformity. S/p L TMA Neuro:  Nonfocal  Vasc: Bilateral groin access sites clean dry and intact  Patient doing well after uncomplicated TAVR from the right transfemoral approach.  He remains afebrile with no signs of infection.  He believes his breathing is improved.  He has developed a new left bundle branch block and we will arrange for ZIO monitor at discharge.  Will follow-up echocardiogram today.  Continue aspirin  and Plavix .  Will plan on optimizing goal-directed medical therapy at follow-up.  Will arrange outpatient structural heart disease follow-up and ZIO monitor.  Will sign off, call with questions.  Lurena Red, MD Pager [phone]

## 2024-01-20 NOTE — Progress Notes (Signed)
 Patient arrived in the unit, V/S obtained, CCMD notified, all needs met, call bell in reach, family at bedside.    01/20/24 1854  Vitals  Temp 97.7 F (36.5 C)  Temp Source Oral  BP 108/62  MAP (mmHg) 76  BP Location Right Arm  BP Method Automatic  Patient Position (if appropriate) Lying  Pulse Rate 67  Pulse Rate Source Monitor  ECG Heart Rate 68  Resp 18  Level of Consciousness  Level of Consciousness Alert  Oxygen Therapy  SpO2 97 %  O2 Device Room Air  Pain Assessment  Pain Scale 0-10  Pain Score 0

## 2024-01-20 NOTE — Anesthesia Postprocedure Evaluation (Signed)
 Anesthesia Post Note  Patient: Phillip [name]  Procedure(s) Performed: Transcatheter Aortic Valve Replacement, Transfemoral (Left) ECHOCARDIOGRAM, TRANSTHORACIC     Patient location during evaluation: Cath Lab Anesthesia Type: MAC Level of consciousness: oriented, patient cooperative and sedated Pain management: pain level controlled Vital Signs Assessment: post-procedure vital signs reviewed and stable Respiratory status: spontaneous breathing, nonlabored ventilation, respiratory function stable and patient connected to nasal cannula oxygen Cardiovascular status: stable Postop Assessment: no apparent nausea or vomiting Anesthetic complications: no   There were no known notable events for this encounter.  Last Vitals:  Vitals:   01/20/24 1840 01/20/24 1854  BP: 115/64 108/62  Pulse: 68 67  Resp: 16 18  Temp:  36.5 C  SpO2: 95% 97%    Last Pain:  Vitals:   01/20/24 1854  TempSrc: Oral  PainSc: 0-No pain                 Myquan Schaumburg,E. Norrin Shreffler

## 2024-01-20 NOTE — Progress Notes (Addendum)
  Progress Note    01/20/2024 8:08 AM 4 Days Post-Op  Subjective:  says his left foot feels sore    Vitals:   01/20/24 0257 01/20/24 0800  BP: (!) 149/78 111/68  Pulse: 89 80  Resp: 12 20  Temp: 98 F (36.7 C) 98.3 F (36.8 C)  SpO2: 97%     Physical Exam: General:  alert and oriented x3 Lungs:  nonlabored Extremities:  left TMA with prevena vac with good seal   CBC    Component Value Date/Time   WBC 10.7 (H) 01/20/2024 0417   RBC 3.79 (L) 01/20/2024 0417   HGB 11.6 (L) 01/20/2024 0417   HGB 11.5 (L) 05/16/2023 1429   HCT 36.1 (L) 01/20/2024 0417   HCT 35.3 (L) 05/16/2023 1429   PLT 221 01/20/2024 0417   PLT 144 (L) 05/16/2023 1429   MCV 95.3 01/20/2024 0417   MCV 90 05/16/2023 1429   MCH 30.6 01/20/2024 0417   MCHC 32.1 01/20/2024 0417   RDW 14.8 01/20/2024 0417   RDW 13.5 05/16/2023 1429   LYMPHSABS 0.7 11/11/2023 1930   LYMPHSABS 0.7 05/16/2023 1429   MONOABS 0.8 11/11/2023 1930   EOSABS 0.0 11/11/2023 1930   EOSABS 0.1 05/16/2023 1429   BASOSABS 0.0 11/11/2023 1930   BASOSABS 0.0 05/16/2023 1429    BMET    Component Value Date/Time   NA 134 (L) 01/20/2024 0417   NA 145 (H) 07/14/2023 1412   NA 136 08/30/2013 1025   K 4.4 01/20/2024 0417   K 3.7 08/30/2013 1025   CL 93 (L) 01/20/2024 0417   CL 101 08/30/2013 1025   CO2 24 01/20/2024 0417   CO2 31 08/30/2013 1025   GLUCOSE 105 (H) 01/20/2024 0417   GLUCOSE 310 (H) 08/30/2013 1025   BUN 55 (H) 01/20/2024 0417   BUN 97 (HH) 07/14/2023 1412   BUN 17 08/30/2013 1025   CREATININE 8.78 (H) 01/20/2024 0417   CREATININE 1.15 08/30/2013 1025   CALCIUM  9.1 01/20/2024 0417   CALCIUM  9.4 08/30/2013 1025   GFRNONAA 6 (L) 01/20/2024 0417   GFRNONAA >60 08/30/2013 1025   GFRAA 27 (L) 02/22/2020 0539   GFRAA >60 08/30/2013 1025    INR    Component Value Date/Time   INR 1.1 01/19/2024 1844     Intake/Output Summary (Last 24 hours) at 01/20/2024 9191 Last data filed at 01/19/2024 2107 Gross per  24 hour  Intake 120 ml  Output 1400 ml  Net -1280 ml      Assessment/Plan:  68 y.o. male is 4 days post op, s/p: left TMA   - He says he feels about the same this morning.  He has continued soreness in his left foot -Left TMA with Prevena VAC with good seal -Continue to mobilize as tolerated.  Continue pain control as needed -Plan is for TAVR today with Dr.Thukkani -Will remove left TMA Prevena VAC either tomorrow or Thursday   Ahmed Holster, [facility] [phone] 01/20/2024 8:08 AM  I agree with the above.  I have seen and evaluated the patient.  He is scheduled for TAVR today.  I will plan for Prevena wound VAC removal once the battery times out.  [name]

## 2024-01-20 NOTE — Op Note (Signed)
 Signed       [facility]   MULTIDISCIPLINARY HEART VALVE TEAM     TAVR OPERATIVE NOTE     Date of Procedure:                01/20/2024   Preoperative Diagnosis:      Severe Aortic Stenosis    Postoperative Diagnosis:    Same    Procedure:        Transcatheter Aortic Valve Replacement - Percutaneous *** Transfemoral Approach             ***              Co-Surgeons:                        Linnie Rayas, MD and ***, MD   Anesthesiologist:                  General   Echocardiographer:              ***, MD   Pre-operative Echo Findings: Severe aortic stenosis normal left ventricular systolic function ***   Post-operative Echo Findings: *** paravalvular leak normal left ventricular systolic function ***     BRIEF CLINICAL NOTE AND INDICATIONS FOR SURGERY   ***       DETAILS OF THE OPERATIVE PROCEDURE   PREPARATION:     The patient was brought to the operating room on the above mentioned date and appropriate monitoring was established by the anesthesia team. The patient was placed in the supine position on the operating table.  Intravenous antibiotics were administered. The patient was monitored closely throughout the procedure under conscious sedation.   Baseline transthoracic echocardiogram was performed. The patient's abdomen and both groins were prepped and draped in a sterile manner. A time out procedure was performed.     PERIPHERAL ACCESS:     Using the modified Seldinger technique, femoral arterial was obtained with placement of 6 Fr sheath on the *** side.  A pigtail diagnostic catheter was passed through the arterial sheath under fluoroscopic guidance into the aortic root. Venous access was from the ***vein.  A temporary transvenous pacemaker catheter was passed through the venous sheath under fluoroscopic guidance into the right ventricle.  The pacemaker was tested to ensure stable lead placement and pacemaker capture. Aortic root  angiography was performed in order to determine the optimal angiographic angle for valve deployment.     TRANSFEMORAL ACCESS:    Percutaneous transfemoral access and sheath placement was performed using ultrasound guidance.  The *** common femoral artery was cannulated using a micropuncture needle and appropriate location was verified using hand injection angiogram.  A pair of Abbott Perclose percutaneous closure devices were placed and a 6 French sheath replaced into the femoral artery.  The patient was heparinized systemically and ACT verified > 250 seconds.     A 14 Fr transfemoral E-sheath was introduced into the *** common femoral artery after progressively dilating over an Amplatz superstiff wire. An pigtail catheter was used to direct a straight-tip exchange length wire across the native aortic valve into the left ventricle. This was exchanged out for a pigtail catheter and position was confirmed in the LV apex. Simultaneous LV and Ao pressures were recorded.  The pigtail catheter was exchanged for a Safari wire in the LV apex.         TRANSCATHETER HEART VALVE DEPLOYMENT:    An ***  transcatheter heart valve (  size *** mm) was prepared and crimped per manufacturer's guidelines, and the proper orientation of the valve is confirmed on the [employer] delivery system. The valve was advanced through the introducer sheath using normal technique until in an appropriate position in the abdominal aorta beyond the sheath tip. The balloon was then retracted and using the fine-tuning wheel was centered on the valve. The valve was then advanced across the aortic arch using appropriate flexion of the catheter. The valve was carefully positioned across the aortic valve annulus. The Commander catheter was retracted using normal technique. Once final position of the valve has been confirmed by angiographic assessment, the valve is deployed during rapid ventricular pacing to maintain systolic blood  pressure < 50 mmHg and pulse pressure < 10 mmHg. The balloon inflation is held for >3 seconds after reaching full deployment volume. Once the balloon has fully deflated the balloon is retracted into the ascending aorta and valve function is assessed using echocardiography. ***There is felt to be trace paravalvular leak and no central aortic insufficiency.  The patient's hemodynamic recovery following valve deployment is good.  The deployment balloon and guidewire are both removed.      PROCEDURE COMPLETION:    The sheath was removed and femoral artery closure performed.  Protamine  was administered once femoral arterial repair was complete. The temporary pacemaker***, pigtail catheter and femoral sheaths were removed with manual pressure used for venous hemostasis.  ***A Mynx femoral closure device was utilized following removal of the diagnostic sheath in the *** femoral artery.   The patient tolerated the procedure well and is transported to the cath lab recovery area in stable condition. There were no immediate intraoperative complications. All sponge instrument and needle counts are verified correct at completion of the operation.    No blood products were administered during the operation.   The patient received a total of *** mL of intravenous contrast during the procedure.

## 2024-01-20 NOTE — Transfer of Care (Signed)
 Immediate Anesthesia Transfer of Care Note  Patient: Phillip [name]  Procedure(s) Performed: Transcatheter Aortic Valve Replacement, Transfemoral (Left) ECHOCARDIOGRAM, TRANSTHORACIC  Patient Location: Cath Lab  Anesthesia Type:MAC  Level of Consciousness: awake and alert   Airway & Oxygen Therapy: Patient Spontanous Breathing  Post-op Assessment: Report given to RN and Post -op Vital signs reviewed and stable  Post vital signs: Reviewed and stable  Last Vitals:  Vitals Value Taken Time  BP 115/80   Temp 98   Pulse 65 01/20/24 18:09  Resp 18 01/20/24 18:09  SpO2 94 % 01/20/24 18:09  Vitals shown include unfiled device data.  Last Pain:  Vitals:   01/20/24 1600  TempSrc: Oral  PainSc:       Patients Stated Pain Goal: 3 (01/15/24 2011)  Complications: There were no known notable events for this encounter.

## 2024-01-20 NOTE — Progress Notes (Signed)
 PROGRESS NOTE    Phillip [name]  FMW:[id number] DOB: 1956-02-27 DOA: 01/14/2024 PCP: [name] LABOR., MD  68/M w ESRD on HD TTS, DM-1, recent acute cholecystitis June 2025) requiring cholecystostomy tube placement, chronic HFrEF, aortic stenosis (TAVR postponed due to left third toe gangrene), PAD-s/p PCI to left tibia-fibula/angioplasty on 7/25 who was admitted by vascular surgery 8/13 for an elective left third toe amputation.  H/o significant issues with nonhealing ulceration of his left foot-progressing into a gangrenous left third toe,  underwent a angiogram of LLE with transluminal angioplasty/stent placement to the left tibioperoneal trunk on 7/25 at [facility].  Plans were to proceed with a left third toe amputation post revascularization.  However due to his significant underlying medical comorbidities, this could not be done at [facility], referred to the [facility] surgery and admitted to [facility] - 8/13 underwent left third toe amputation including metatarsal head, unfortunately bone was not healthy, no capillary bleeding at this level, patient felt to require TMA or BKA however patient declined this. - 8/15: Left foot transmetatarsal amputation - Structural heart team following as well, due for TAVR on Tuesday  Subjective: -, No events overnight, had dialysis yesterday, n.p.o. for TAVR  Assessment and Plan:  Left third toe gangrene S/p amputation of left third toe 8/13, unfortunately had no bleeding during surgery suggestive of poor future healing, initially declined BKA or TMA  - Sp transmetatarsal amputation and wound VAC placement 8/15 - Continue current wound care - Plan for TAVR today  He PAD s/p angioplasty/PCI to left tibioperoneal trunk on 7/25 See above regarding left third toe tissue. Continue aspirin /Plavix /statin.   ESRD on HD TTS -Nephrology following, dialyzed yesterday Per chart review-patient was tunneled HD catheter was recently removed on 8/6, he currently has a  functioning LUE AVF   Normocytic anemia - Secondary to CKD, stable monitor   Chronic HFrEF Euvolemic Last echo 1/25 at OSH-EF 30-35% SEVERE LFLG Volume removal with hemodialysis Continue beta-blocker/Imdur .   Severe aortic stenosis -Followed by structural heart team,-plans are for TAVR on Tuesday   History of CAD s/p CABG 2003 No anginal symptoms -last cath 2/25: Severe CAD w/ all vein grafts occluded On aspirin /Plavix /statin.   Recent history of acute cholecystitis requiring cholecystostomy drain June 2025 Cholecystostomy drain remains in place Routine drain care Reviewed outpatient general surgery note-definite surgery to be done post TAVR   DM-2 CBGs are stable, continue SSI, recent A1c was 5.8   Hypothyroidism Synthroid   Trace blood in stools 8/15 - No recurrence since, restart subcu heparin    DVT prophylaxis: Heparin  subcutaneous Code Status: Full code Family Communication: None present Disposition Plan: CIR consulted    Objective: Vitals:   01/20/24 0800 01/20/24 0843 01/20/24 1151 01/20/24 1209  BP: 111/68 105/71 118/67   Pulse: 80 75 80 80  Resp: 20  18 16   Temp: 98.3 F (36.8 C)  98 F (36.7 C)   TempSrc: Oral  Oral   SpO2:    98%  Weight:      Height:        Intake/Output Summary (Last 24 hours) at 01/20/2024 1220 Last data filed at 01/19/2024 2107 Gross per 24 hour  Intake 120 ml  Output 1300 ml  Net -1180 ml   Filed Weights   01/15/24 1645 01/19/24 1241 01/19/24 1626  Weight: 80.3 kg 76.8 kg 75.2 kg    Examination:  General exam: Appears calm and comfortable, AO x 3, no distress Respiratory system: Clear Cardiovascular system: S1 & S2 heard, RRR.  Systolic murmur Abd: n soft nontender, bowel sounds present, gallbladder drain feels okay Central nervous system: Alert and oriented. No focal neurological deficits. Extremities: Left foot with TMA, wound VAC, left arm AV fistula Skin: No rashes on exposed skin Psychiatry:  Mood & affect  appropriate.     Data Reviewed:   CBC: Recent Labs  Lab 01/17/24 0320 01/17/24 1228 01/18/24 0340 01/19/24 0442 01/20/24 0417  WBC 8.3 10.5 11.8* 11.1* 10.7*  HGB 9.9* 10.7* 10.9* 10.7* 11.6*  HCT 31.3* 33.3* 33.6* 32.9* 36.1*  MCV 95.4 95.1 93.6 94.0 95.3  PLT 204 226 217 225 221   Basic Metabolic Panel: Recent Labs  Lab 01/17/24 1228 01/18/24 0340 01/19/24 0442 01/19/24 1844 01/20/24 0417  NA 135 134* 133* 134* 134*  K 4.1 3.5 4.2 4.5 4.4  CL 95* 93* 93* 92* 93*  CO2 27 27 25 26 24   GLUCOSE 141* 120* 129* 105* 105*  BUN 48* 24* 43* 49* 55*  CREATININE 7.95* 4.91* 7.16* 8.20* 8.78*  CALCIUM  8.5* 8.6* 8.7* 9.0 9.1  MG  --   --  2.1  --   --    GFR: Estimated Creatinine Clearance: 8.6 mL/min (A) (by C-G formula based on SCr of 8.78 mg/dL (H)). Liver Function Tests: Recent Labs  Lab 01/15/24 0338 01/19/24 1844  AST 19 22  ALT 11 <5  ALKPHOS 142* 161*  BILITOT 0.8 0.8  PROT 7.6 9.4*  ALBUMIN  2.1* 2.5*   No results for input(s): LIPASE, AMYLASE in the last 168 hours. No results for input(s): AMMONIA in the last 168 hours. Coagulation Profile: Recent Labs  Lab 01/19/24 1844  INR 1.1   Cardiac Enzymes: No results for input(s): CKTOTAL, CKMB, CKMBINDEX, TROPONINI in the last 168 hours. BNP (last 3 results) Recent Labs    07/14/23 1412  PROBNP 11,269*   HbA1C: No results for input(s): HGBA1C in the last 72 hours. CBG: Recent Labs  Lab 01/19/24 1155 01/19/24 1749 01/19/24 2120 01/20/24 0618 01/20/24 1149  GLUCAP 116* 96 124* 113* 136*   Lipid Profile: No results for input(s): CHOL, HDL, LDLCALC, TRIG, CHOLHDL, LDLDIRECT in the last 72 hours. Thyroid  Function Tests: No results for input(s): TSH, T4TOTAL, FREET4, T3FREE, THYROIDAB in the last 72 hours. Anemia Panel: No results for input(s): VITAMINB12, FOLATE, FERRITIN, TIBC, IRON , RETICCTPCT in the last 72 hours. Urine analysis:     Component Value Date/Time   COLORURINE YELLOW 11/07/2020 0744   APPEARANCEUR CLEAR 11/07/2020 0744   LABSPEC 1.011 11/07/2020 0744   PHURINE 5.0 11/07/2020 0744   GLUCOSEU 50 (A) 11/07/2020 0744   HGBUR SMALL (A) 11/07/2020 0744   BILIRUBINUR NEGATIVE 11/07/2020 0744   KETONESUR NEGATIVE 11/07/2020 0744   PROTEINUR 100 (A) 11/07/2020 0744   NITRITE NEGATIVE 11/07/2020 0744   LEUKOCYTESUR NEGATIVE 11/07/2020 0744   Sepsis Labs: @LABRCNTIP (procalcitonin:4,lacticidven:4)  ) Recent Results (from the past 240 hours)  Surgical pcr screen     Status: None   Collection Time: 01/16/24  2:52 AM   Specimen: Nasal Mucosa; Nasal Swab  Result Value Ref Range Status   MRSA, PCR NEGATIVE NEGATIVE Final   Staphylococcus aureus NEGATIVE NEGATIVE Final    Comment: (NOTE) The Xpert SA Assay (FDA approved for NASAL specimens in patients 51 years of age and older), is one component of a comprehensive surveillance program. It is not intended to diagnose infection nor to guide or monitor treatment. Performed at [facility], [address], [place], [place] [address]  Radiology Studies: No results found.   Scheduled Meds:  (feeding supplement) PROSource Plus  30 mL Oral BID BM   aspirin  EC  81 mg Oral Daily   atorvastatin   20 mg Oral Daily   carvedilol   3.125 mg Oral BID   clopidogrel   75 mg Oral Daily   heparin   5,000 Units Subcutaneous Q8H   insulin  aspart  0-6 Units Subcutaneous TID WC   isosorbide  mononitrate  120 mg Oral Daily   levothyroxine   50 mcg Oral Daily   sevelamer  carbonate  800 mg Oral TID WC   tamsulosin   0.4 mg Oral QPC breakfast   Continuous Infusions:   LOS: 6 days    Time spent:    Sigurd Pac, MD [facility]   01/20/2024, 12:20 PM

## 2024-01-20 NOTE — Progress Notes (Signed)
 PT Cancellation Note  Patient Details Name: Phillip [name] MRN: [id number] DOB: 01/25/1956   Cancelled Treatment:    Reason Eval/Treat Not Completed: Patient at procedure or test/unavailable  Currently off unit for procedure (TAVR.) Will follow acutely.  Phillip [name], PT, DPT [facility]  Rehabilitation Services Physical Therapist Office: [phone] Website: [email or web address]   Phillip [name] 01/20/2024, 3:16 PM

## 2024-01-21 ENCOUNTER — Encounter (HOSPITAL_COMMUNITY): Payer: Self-pay | Admitting: Internal Medicine

## 2024-01-21 ENCOUNTER — Inpatient Hospital Stay (HOSPITAL_COMMUNITY)

## 2024-01-21 DIAGNOSIS — D638 Anemia in other chronic diseases classified elsewhere: Secondary | ICD-10-CM

## 2024-01-21 DIAGNOSIS — I10 Essential (primary) hypertension: Secondary | ICD-10-CM

## 2024-01-21 DIAGNOSIS — Z794 Long term (current) use of insulin: Secondary | ICD-10-CM

## 2024-01-21 DIAGNOSIS — Z952 Presence of prosthetic heart valve: Secondary | ICD-10-CM

## 2024-01-21 DIAGNOSIS — I5020 Unspecified systolic (congestive) heart failure: Secondary | ICD-10-CM | POA: Diagnosis not present

## 2024-01-21 DIAGNOSIS — I739 Peripheral vascular disease, unspecified: Secondary | ICD-10-CM

## 2024-01-21 DIAGNOSIS — E1152 Type 2 diabetes mellitus with diabetic peripheral angiopathy with gangrene: Secondary | ICD-10-CM | POA: Diagnosis not present

## 2024-01-21 DIAGNOSIS — K810 Acute cholecystitis: Secondary | ICD-10-CM | POA: Diagnosis not present

## 2024-01-21 DIAGNOSIS — E872 Acidosis, unspecified: Secondary | ICD-10-CM | POA: Diagnosis not present

## 2024-01-21 DIAGNOSIS — Z89432 Acquired absence of left foot: Secondary | ICD-10-CM | POA: Diagnosis not present

## 2024-01-21 DIAGNOSIS — I96 Gangrene, not elsewhere classified: Secondary | ICD-10-CM | POA: Diagnosis not present

## 2024-01-21 DIAGNOSIS — E119 Type 2 diabetes mellitus without complications: Secondary | ICD-10-CM

## 2024-01-21 DIAGNOSIS — I2510 Atherosclerotic heart disease of native coronary artery without angina pectoris: Secondary | ICD-10-CM

## 2024-01-21 DIAGNOSIS — Z992 Dependence on renal dialysis: Secondary | ICD-10-CM

## 2024-01-21 DIAGNOSIS — N186 End stage renal disease: Secondary | ICD-10-CM | POA: Diagnosis not present

## 2024-01-21 LAB — CBC
HCT: 35 % — ABNORMAL LOW (ref 39.0–52.0)
Hemoglobin: 11.2 g/dL — ABNORMAL LOW (ref 13.0–17.0)
MCH: 30.4 pg (ref 26.0–34.0)
MCHC: 32 g/dL (ref 30.0–36.0)
MCV: 94.9 fL (ref 80.0–100.0)
Platelets: 197 K/uL (ref 150–400)
RBC: 3.69 MIL/uL — ABNORMAL LOW (ref 4.22–5.81)
RDW: 14.9 % (ref 11.5–15.5)
WBC: 11 K/uL — ABNORMAL HIGH (ref 4.0–10.5)
nRBC: 0 % (ref 0.0–0.2)

## 2024-01-21 LAB — ECHOCARDIOGRAM COMPLETE
AR max vel: 5.08 cm2
AV Area VTI: 4.55 cm2
AV Area mean vel: 4.73 cm2
AV Mean grad: 5 mmHg
AV Peak grad: 10.2 mmHg
Ao pk vel: 1.6 m/s
Area-P 1/2: 4.15 cm2
Calc EF: 29.3 %
Height: 72 in
MV VTI: 3.42 cm2
S' Lateral: 4.8 cm
Single Plane A2C EF: 25.6 %
Single Plane A4C EF: 30.3 %
Weight: 2649.05 [oz_av]

## 2024-01-21 LAB — MAGNESIUM: Magnesium: 2.2 mg/dL (ref 1.7–2.4)

## 2024-01-21 LAB — BASIC METABOLIC PANEL WITH GFR
Anion gap: 17 — ABNORMAL HIGH (ref 5–15)
BUN: 72 mg/dL — ABNORMAL HIGH (ref 8–23)
CO2: 22 mmol/L (ref 22–32)
Calcium: 8.5 mg/dL — ABNORMAL LOW (ref 8.9–10.3)
Chloride: 92 mmol/L — ABNORMAL LOW (ref 98–111)
Creatinine, Ser: 10.74 mg/dL — ABNORMAL HIGH (ref 0.61–1.24)
GFR, Estimated: 5 mL/min — ABNORMAL LOW (ref >60)
Glucose, Bld: 109 mg/dL — ABNORMAL HIGH (ref 70–99)
Potassium: 4.8 mmol/L (ref 3.5–5.1)
Sodium: 131 mmol/L — ABNORMAL LOW (ref 135–145)

## 2024-01-21 LAB — GLUCOSE, CAPILLARY
Glucose-Capillary: 109 mg/dL — ABNORMAL HIGH (ref 70–99)
Glucose-Capillary: 121 mg/dL — ABNORMAL HIGH (ref 70–99)
Glucose-Capillary: 157 mg/dL — ABNORMAL HIGH (ref 70–99)
Glucose-Capillary: 129 mg/dL — ABNORMAL HIGH (ref 70–99)

## 2024-01-21 MED ORDER — CHLORHEXIDINE GLUCONATE CLOTH 2 % EX PADS
6.0000 | MEDICATED_PAD | Freq: Every day | CUTANEOUS | Status: DC
  Administered 2024-01-21 – 2024-01-30 (×9): 6 via TOPICAL

## 2024-01-21 NOTE — Progress Notes (Signed)
 IP rehab admissions - I have opened the case today with insurance carrier.  I also asked Dr. Urbano to complete an MD rehab consult; see consult from 01/21/24.  I will await decision from insurance case manager and follow up then.  [phone]

## 2024-01-21 NOTE — Progress Notes (Signed)
 Physical Therapy Re-Evaluation Patient Details Name: Phillip [name] MRN: [id number] DOB: 22-Aug-1955 Today's Date: 01/21/2024   History of Present Illness Phillip [name] is a 68 y.o. male admitted 01/14/24 for left third toe gangrene. Pt s/p left third toe amputation including met head 8/13 and left TMA with wound vac application 8/15. Currently s/p TAVR on 8/19. PMHx: T1DM, PAD s/p angioplasty/PCI to left tibioperoneal trunk on 7/25, ESRD on HD TTS, HTN, chronic HFrEF, severe aortic stenosis, acute cholecystitis requiring cholecystostomy drain 6/25    PT Comments  Pt is very motivated and progressing towards previous goals. Goals assessed and remain appropriate after TAVR. Pt currently supervision for bed mobility, Min to Mod A for sit to stand, transfers and limited gait. Pt is limited in gait due to difficulty with maintaining heel WB due to weakness and poor coordination/kinesthetic awareness of LLE. Due to pt current functional status, home set up and available assistance at home recommending skilled physical therapy services > 3 hours/day in order to address strength, balance and functional mobility to decrease risk for falls, injury, immobility, skin break down and re-hospitalization.      If plan is discharge home, recommend the following: A lot of help with walking and/or transfers;Assistance with cooking/housework;Assist for transportation;Help with stairs or ramp for entrance   Can travel by private vehicle     No  Equipment Recommendations  None recommended by PT       Precautions / Restrictions Precautions Precautions: Fall Recall of Precautions/Restrictions: Intact Precaution/Restrictions Comments: R abdominal drain; L foot wound vac Required Braces or Orthoses: Other Brace Other Brace: Darco wedge shoe LLE Restrictions Weight Bearing Restrictions Per Provider Order: No     Mobility  Bed Mobility Overal bed mobility: Needs Assistance Bed Mobility: Supine to Sit      Supine to sit: HOB elevated, Used rails, Supervision     General bed mobility comments: Pt sat up on R side of bed with increased time. Assist to manage lines/leads.    Transfers Overall transfer level: Needs assistance Equipment used: Rolling walker (2 wheels) Transfers: Sit to/from Stand, Bed to chair/wheelchair/BSC Sit to Stand: Mod assist   Step pivot transfers: Min assist       General transfer comment: Pt stood from lowest bed height. Cued proper hand placement using RW. Mod A for initial momentum to get to standing. Intermittent verbal cues for L Heel WB    Ambulation/Gait Ambulation/Gait assistance: Min assist Gait Distance (Feet): 3 Feet Assistive device: Rolling walker (2 wheels) Gait Pattern/deviations: Step-to pattern, Decreased step length - right, Decreased step length - left, Decreased stance time - left, Decreased weight shift to left Gait velocity: decreased Gait velocity interpretation: <1.31 ft/sec, indicative of household ambulator   General Gait Details: Pt took short slow steps. Cues for sequencing and heel WB . Assist to support trunk, manuever RW, and manage lines/leads.     Balance Overall balance assessment: Needs assistance Sitting-balance support: No upper extremity supported, Feet supported Sitting balance-Leahy Scale: Good     Standing balance support: Bilateral upper extremity supported, During functional activity, Reliant on assistive device for balance Standing balance-Leahy Scale: Poor Standing balance comment: Pt dependent on RW        Communication Communication Communication: No apparent difficulties  Cognition Arousal: Alert Behavior During Therapy: WFL for tasks assessed/performed   PT - Cognitive impairments: No apparent impairments         Following commands: Intact      Cueing Cueing Techniques: Verbal cues  Pertinent Vitals/Pain Pain Assessment Pain Assessment: 0-10 Pain Score: 8  Pain Location: L  foot Pain Descriptors / Indicators: Discomfort, Aching Pain Intervention(s): Limited activity within patient's tolerance, Monitored during session, Repositioned, Other (comment) (education on phantom pain)     PT Goals (current goals can now be found in the care plan section) Acute Rehab PT Goals Patient Stated Goal: Recover well to be able to return Home PT Goal Formulation: With patient Time For Goal Achievement: 02/04/24 Potential to Achieve Goals: Good Progress towards PT goals: Progressing toward goals    Frequency    Min 3X/week      PT Plan  Continue with current POC        AM-PAC PT 6 Clicks Mobility   Outcome Measure  Help needed turning from your back to your side while in a flat bed without using bedrails?: A Little Help needed moving from lying on your back to sitting on the side of a flat bed without using bedrails?: A Little Help needed moving to and from a bed to a chair (including a wheelchair)?: A Little Help needed standing up from a chair using your arms (e.g., wheelchair or bedside chair)?: A Little Help needed to walk in hospital room?: A Lot Help needed climbing 3-5 steps with a railing? : Total 6 Click Score: 15    End of Session Equipment Utilized During Treatment: Gait belt Activity Tolerance: Patient tolerated treatment well;Treatment limited secondary to medical complications (Comment) Patient left: in chair;with call bell/phone within reach;with chair alarm set Nurse Communication: Mobility status;Patient requests pain meds PT Visit Diagnosis: Muscle weakness (generalized) (M62.81);Difficulty in walking, not elsewhere classified (R26.2);Unsteadiness on feet (R26.81);Other abnormalities of gait and mobility (R26.89)     Time: 8686-8655 PT Time Calculation (min) (ACUTE ONLY): 31 min  Charges:    $Therapeutic Activity: 8-22 mins PT General Charges $$ ACUTE PT VISIT: 1 Visit                     Dorothyann Maier, DPT, CLT  Acute  Rehabilitation Services Office: [phone] (Secure chat preferred)    Dorothyann VEAR Maier 01/21/2024, 1:54 PM

## 2024-01-21 NOTE — Progress Notes (Signed)
  Echocardiogram 2D Echocardiogram has been performed.  Phillip [name] 01/21/2024, 11:51 AM

## 2024-01-21 NOTE — Progress Notes (Signed)
 Progress Note   Patient: Phillip [name] FMW:[id number] DOB: [birth year] DOA: 01/14/2024     7 DOS: the patient was seen and examined on 01/21/2024   Brief hospital course: From HPI 67/M w ESRD on HD TTS, DM-1, recent acute cholecystitis June 2025) requiring cholecystostomy tube placement, chronic HFrEF, aortic stenosis (TAVR postponed due to left third toe gangrene), PAD-s/p PCI to left tibia-fibula/angioplasty on 7/25 who was admitted by vascular surgery 8/13 for an elective left third toe amputation.  H/o significant issues with nonhealing ulceration of his left foot-progressing into a gangrenous left third toe,  underwent a angiogram of LLE with transluminal angioplasty/stent placement to the left tibioperoneal trunk on 7/25 at [facility].  Plans were to proceed with a left third toe amputation post revascularization.  However due to his significant underlying medical comorbidities, this could not be done at [facility], referred to the [facility]-vascular surgery and admitted to [facility]   Assessment and Plan:  Left third toe gangrene S/p amputation of left third toe 8/13, unfortunately had no bleeding during surgery suggestive of poor future healing, initially declined BKA or TMA  - Sp transmetatarsal amputation and wound VAC placement 8/15 - Continue current wound care    He PAD s/p angioplasty/PCI to left tibioperoneal trunk on 7/25 Continue aspirin /Plavix /statin.   ESRD on HD TTS -Nephrology following for dialysis needs Per chart review-patient was tunneled HD catheter was recently removed on 8/6, he currently has a functioning LUE AVF   Normocytic anemia - Secondary to CKD, stable monitor   Chronic HFrEF Euvolemic Last echo 1/25 at OSH-EF 30-35% SEVERE LFLG Volume removal with hemodialysis Continue beta-blocker/Imdur .   Severe aortic stenosis Patient underwent TAVR on 01/20/2024 Cardiology team on board Cardiology plans  to place Zio patch before discharge due to new left bundle branch block    History of CAD s/p CABG 2003 No anginal symptoms -last cath 2/25: Severe CAD w/ all vein grafts occluded On aspirin /Plavix /statin.   Recent history of acute cholecystitis requiring cholecystostomy drain June 2025 Cholecystostomy drain remains in place Routine drain care Reviewed outpatient general surgery note-definite surgery to be done post TAVR   DM-2 CBGs are stable, continue SSI, recent A1c was 5.8   Hypothyroidism Synthroid      DVT prophylaxis: Heparin  subcutaneous  Code Status: Full code  Family Communication: None present  Disposition Plan: CIR consulted  Subjective:  Seen and examined at bedside this morning Admits to improvement in respiratory function Denies chest pain nausea vomiting or cough  Physical Exam:  General exam: Appears calm and comfortable, AO x 3, no distress Respiratory system: Clear Cardiovascular system: S1 & S2 heard, RRR.  Systolic murmur Abd: n soft nontender, bowel sounds present, gallbladder drain feels okay Central nervous system: Alert and oriented. No focal neurological deficits. Extremities: Left foot with TMA, wound VAC, left arm AV fistula Skin: No rashes on exposed skin Psychiatry:  Mood & affect appropriate.   Vitals:   01/21/24 0853 01/21/24 1139 01/21/24 1550 01/21/24 1613  BP: 128/75 125/65 (!) 88/61 (!) 88/69  Pulse: 73 73 85 67  Resp: 19 19 18 19   Temp:  97.7 F (36.5 C) 98.1 F (36.7 C)   TempSrc:  Oral Oral   SpO2: 100% 100% 100% 97%  Weight:      Height:        Data Reviewed:     Latest Ref Rng & Units 01/21/2024    3:31 AM 01/20/2024    5:52 PM 01/20/2024    4:17 AM  CBC  WBC 4.0 - 10.5 K/uL 11.0   10.7   Hemoglobin 13.0 - 17.0 g/dL 88.7  88.3  88.3   Hematocrit 39.0 - 52.0 % 35.0  34.0  36.1   Platelets 150 - 400 K/uL 197   221        Latest Ref Rng & Units 01/21/2024    3:31 AM 01/20/2024    5:52 PM 01/20/2024    4:17 AM  BMP  Glucose 70 - 99 mg/dL 890  883  894   BUN 8 - 23 mg/dL 72  58  55    Creatinine 0.61 - 1.24 mg/dL 89.25  1.59  1.21   Sodium 135 - 145 mmol/L 131  124  134   Potassium 3.5 - 5.1 mmol/L 4.8  4.2  4.4   Chloride 98 - 111 mmol/L 92  86  93   CO2 22 - 32 mmol/L 22   24   Calcium  8.9 - 10.3 mg/dL 8.5   9.1       Author: Drue ONEIDA Potter, MD 01/21/2024 6:25 PM  For on call review [email or web address].

## 2024-01-21 NOTE — Progress Notes (Signed)
 [facility] Progress Note   Subjective:  Patient seen and examined in his room this morning. He reports 8/10 pain related to his amputation. He denies any dyspnea or CP today. Had TAVR on 01/20/24 and there were no complications related to the procedure. BP stable today. Possible d/c today to SNF or CIR.   Objective Vitals:   01/21/24 0335 01/21/24 0500 01/21/24 0757 01/21/24 0853  BP: 121/72  113/72 128/75  Pulse: 77  76 73  Resp: 18  16 19   Temp: 98 F (36.7 C)  98.1 F (36.7 C)   TempSrc: Oral  Oral   SpO2: 99%  100% 100%  Weight:  75.1 kg    Height:       Physical Exam General: Alert, sitting up in bed, nad  Heart: RRR Lungs: Clear  Abdomen: soft, NTND Extremities: no LE edema, L TMA w/wound vac in place Dialysis Access: LU AVF +b/t   Filed Weights   01/19/24 1241 01/19/24 1626 01/21/24 0500  Weight: 76.8 kg 75.2 kg 75.1 kg    Intake/Output Summary (Last 24 hours) at 01/21/2024 1033 Last data filed at 01/20/2024 2018 Gross per 24 hour  Intake 740 ml  Output --  Net 740 ml    Additional Objective Labs: Basic Metabolic Panel: Recent Labs  Lab 01/19/24 1844 01/20/24 0417 01/20/24 1752 01/21/24 0331  NA 134* 134* 124* 131*  K 4.5 4.4 4.2 4.8  CL 92* 93* 86* 92*  CO2 26 24  --  22  GLUCOSE 105* 105* 116* 109*  BUN 49* 55* 58* 72*  CREATININE 8.20* 8.78* 8.40* 10.74*  CALCIUM  9.0 9.1  --  8.5*   Liver Function Tests: Recent Labs  Lab 01/15/24 0338 01/19/24 1844  AST 19 22  ALT 11 <5  ALKPHOS 142* 161*  BILITOT 0.8 0.8  PROT 7.6 9.4*  ALBUMIN  2.1* 2.5*   CBC: Recent Labs  Lab 01/17/24 1228 01/18/24 0340 01/19/24 0442 01/20/24 0417 01/20/24 1752 01/21/24 0331  WBC 10.5 11.8* 11.1* 10.7*  --  11.0*  HGB 10.7* 10.9* 10.7* 11.6* 11.6* 11.2*  HCT 33.3* 33.6* 32.9* 36.1* 34.0* 35.0*  MCV 95.1 93.6 94.0 95.3  --  94.9  PLT 226 217 225 221  --  197   CBG: Recent Labs  Lab 01/20/24 0618 01/20/24 1149 01/20/24 1902  01/20/24 2114 01/21/24 0614  GLUCAP 113* 136* 101* 102* 109*    Medications:  sodium chloride      norepinephrine  (LEVOPHED ) Adult infusion      (feeding supplement) PROSource Plus  30 mL Oral BID BM   aspirin  EC  81 mg Oral Daily   atorvastatin   20 mg Oral Daily   carvedilol   3.125 mg Oral BID   clopidogrel   75 mg Oral Daily   heparin   5,000 Units Subcutaneous Q8H   insulin  aspart  0-6 Units Subcutaneous TID WC   isosorbide  mononitrate  120 mg Oral Daily   levothyroxine   50 mcg Oral Daily   sevelamer  carbonate  800 mg Oral TID WC   sodium chloride  flush  3 mL Intravenous Q12H   tamsulosin   0.4 mg Oral QPC breakfast    Dialysis Orders: [facility] HD TTS - No heparin  - Left AVF (in use for 2 weeks) - EDW 81 kg 4 hr 350/800 2K/2.5 Ca Bicarb 39 Mircera: 50 mcg ordered - but has not received any ESA yet No VDRA Renvela  800 mg TID WC   Assessment/Plan: PAD/ Left third toe gangrene: s/p amputation of  left third toe 01/14/24. Then TMA completed on 01/17/24 by Dr. Serene.  Severe Aortic Stenosis: TAVR 01/20/24  ESRD:  Usual HD TTS. Had HD off schedule Monday to accommodate for surgery on Tues. Resume regular schedule on Thursday. Next HD 8/21.  Hypertension/volume: BP in goal. On carvedilol  3.125mg  BID - hold in AM on HD days.  Use albumin  with HD for BP support. UF as able.  Chronic HFrEF: Continue volume removal with HD.  Recent acute cholecystitis requiring cholecystostomy drain 6/25: drain is in place.  Anemia: Hgb 10-11.  No indication for ESA at this time.  Metabolic bone disease: restarted home Sevelamer  800 TID WC. Corrected calcium  in goal. Check phos. Not on VDRA at his OP clinic.  DMT2: Insulin  per primary.  Nutrition:  Albumin  low. Add supp. On renal diet with fluid restriction  Belvie Och, NP [facility] 01/21/2024,10:33 AM

## 2024-01-21 NOTE — Op Note (Addendum)
 [facility]  TAVR OPERATIVE NOTE   Date of Procedure:  01/20/2024  Preoperative Diagnosis: Severe Aortic Stenosis   Postoperative Diagnosis: Same   Procedure:   Transcatheter Aortic Valve Replacement - Transfemoral Approach  Edwards Sapien 3 Resilia THV (size 29 mm, model # [id number])   Co-Surgeons:  Linnie Rayas MD and Lurena Red, MD Anesthesiologist:  Epifanio  Echocardiographer:  O'Neal  Pre-operative Echo Findings: Severe aortic stenosis Severe left ventricular systolic dysfunction  Post-operative Echo Findings: Trace paravalvular leak Unchanged left ventricular systolic function  Left Heart Catheterization Findings: Left ventricular end-diastolic pressure of   BRIEF CLINICAL NOTE AND INDICATIONS FOR SURGERY  The patient is a 68 year old male with a history of hypertension, hyperlipidemia, end-stage renal disease, peripheral arterial disease status post left tibioperoneal, peroneal, and popliteal drug-coated balloon angioplasty in 2024, left tibioperoneal trunk angioplasty and stenting in July 2025, type 2 diabetes, ischemic cardiomyopathy with ejection fraction of 30 to 35%, coronary artery disease status post CABG with all vein grafts occluded and LIMA to LAD remaining patent, sepsis secondary to acute cholecystitis status post percutaneous drain placement and most recently left TMA who was referred for transcatheter aortic valve replacement for treatment of the patient's severe symptomatic aortic stenosis.  During the course of the patient's preoperative work up they have been evaluated comprehensively by a multidisciplinary team of specialists coordinated through the Multidisciplinary Heart Valve Clinic in the [facility].  They have been demonstrated to suffer from symptomatic severe aortic stenosis as noted above. The patient has been counseled extensively as to the relative risks and benefits of all options for the  treatment of severe aortic stenosis including long term medical therapy, conventional surgery for aortic valve replacement, and transcatheter aortic valve replacement.  The patient has been independently evaluated by Dr. Lucas with CT surgery and they are felt to be at high risk for conventional surgical aortic valve replacement. The surgeon indicated the patient would be a poor candidate for conventional surgery. Based upon review of all of the patient's preoperative diagnostic tests they are felt to be candidate for transcatheter aortic valve replacement using the transfemoral approach as an alternative to high risk conventional surgery.    Following the decision to proceed with transcatheter aortic valve replacement, a discussion has been held regarding what types of management strategies would be attempted intraoperatively in the event of life-threatening complications, including whether or not the patient would be considered a candidate for the use of cardiopulmonary bypass and/or conversion to open sternotomy for attempted surgical intervention.  The patient has been advised of a variety of complications that might develop peculiar to this approach including but not limited to risks of death, stroke, paravalvular leak, aortic dissection or other major vascular complications, aortic annulus rupture, device embolization, cardiac rupture or perforation, acute myocardial infarction, arrhythmia, heart block or bradycardia requiring permanent pacemaker placement, congestive heart failure, respiratory failure, renal failure, pneumonia, infection, other late complications related to structural valve deterioration or migration, or other complications that might ultimately cause a temporary or permanent loss of functional independence or other long term morbidity.  The patient provides full informed consent for the procedure as described and all questions were answered preoperatively.    DETAILS OF THE OPERATIVE  PROCEDURE  PREPARATION:   The patient is brought to the operating room on the above mentioned date and central monitoring was established by the anesthesia team. The patient is placed in the supine position on the operating table.  Intravenous antibiotics are administered. Conscious sedation is used.   Baseline transthoracic echocardiogram was performed. The patient's chest, abdomen, both groins, and both lower extremities are prepared and draped in a sterile manner. A time out procedure is performed.   PERIPHERAL ACCESS:   Using the modified Seldinger technique, femoral arterial and venous access were obtained with placement of a 6 Fr sheath in the right common femoral artery using u/s guidance.  A pigtail diagnostic catheter was passed through the femoral arterial sheath under fluoroscopic guidance into the aortic root.  Aortic root angiography was performed in order to determine the optimal angiographic angle for valve deployment.  TRANSFEMORAL ACCESS:  A micropuncture kit was used to gain access to the left common femoral artery using u/s guidance. Position confirmed with angiography. Pre-closure with double ProGlide closure devices. The patient was heparinized systemically and ACT verified > 250 seconds.    A 16 Fr transfemoral E-sheath was introduced into the left common femoral artery after progressively dilating over an Amplatz superstiff wire. An AL-1 catheter was used to direct a straight-tip exchange length wire across the native aortic valve into the left ventricle. This was exchanged out for a pigtail catheter and position was confirmed in the LV apex. Simultaneous left ventricular, aortic, and left ventricular end-diastolic pressures were recorded.  The pigtail catheter was then exchanged for an Safari wire in the LV apex.  Direct LV pacing thresholds were assessed and found to be adequate.   TRANSCATHETER HEART VALVE DEPLOYMENT:  An Edwards Sapien 3 THV (size 29 mm) was prepared and  crimped per manufacturer's guidelines, and the proper orientation of the valve is confirmed on the [employer] delivery system. The valve was advanced through the introducer sheath using normal technique until in an appropriate position in the abdominal aorta beyond the sheath tip. The balloon was then retracted and using the fine-tuning wheel was centered on the valve. The valve was then advanced across the aortic arch using appropriate flexion of the catheter. The valve was carefully positioned across the aortic valve annulus. The Commander catheter was retracted using normal technique. Once final position of the valve has been confirmed by angiographic assessment, the valve is deployed while temporarily holding ventilation and during rapid ventricular pacing to maintain systolic blood pressure < 50 mmHg and pulse pressure < 10 mmHg. The balloon inflation is held for >3 seconds after reaching full deployment volume. Once the balloon has fully deflated the balloon is retracted into the ascending aorta and valve function is assessed using TTE. There is felt to be trace paravalvular leak and no central aortic insufficiency.  The patient's hemodynamic recovery following valve deployment is good.  The deployment balloon and guidewire are both removed. Echo demostrated acceptable post-procedural gradients, stable mitral valve function, and no AI.   PROCEDURE COMPLETION:  The sheath was then removed and closure devices were completed. Protamine  was administered once femoral arterial repair was complete. The temporary pacemaker, pigtail catheters and femoral sheaths were removed with  a Mynx closure device placed in the artery and manual pressure used for venous hemostasis.    The patient tolerated the procedure well and is transported to the surgical intensive care in stable condition. There were no immediate intraoperative complications. All sponge instrument and needle counts are verified correct at  completion of the operation.   No blood products were administered during the operation.    Albie Bazin K Jalesa Thien MD 01/21/2024 6:38 AM

## 2024-01-21 NOTE — Progress Notes (Signed)
 Discussed restrictions and CRPII with pt. Will refer to [facility]. Will sign off.  [id number] Aliene Aris BS, ACSM-CEP 01/21/2024 3:03 PM

## 2024-01-21 NOTE — Consult Note (Addendum)
 Physical Medicine and Rehabilitation Consult Reason for Consult:Rehab Referring Physician: Dr Wendel   HPI: Phillip [name] is a 68 y.o. male with past medical history of CHF, anemia, ESRD on HD, PVD, diabetes mellitus type 2, severe aortic stenosis, obesity, CAD s/p CABG, cholelithiasis.  Patient was initially admitted 8/13 for gangrenous left third toe.  On 8/13 he had left third toe amputation including metatarsal head by Dr. Serene.  It was felt the patient may require TMA or BKA however patient refused initially.  Patient later agreed to TMA and this was completed by Dr. Serene on 8/15 placed.  Patient remained inpatient for planned TAVR for severe aortic stenosis.  TAVR was completed on 01/20/2024 via TF approach.  Patient seen by PT and felt to be a candidate for acute inpatient rehabilitation.  Required mod assist for sit to stand, min assist step pivot transfers, min assist for ambulation 3 feet with rolling walker.  Per chart review patient lives in a home with 2 steps to enter.  Family can assist after discharge.  Home: Home Living Family/patient expects to be discharged to:: Private residence Living Arrangements: Spouse/significant other Available Help at Discharge: Family, Available 24 hours/day Type of Home: House Home Access: Stairs to enter [employer] of Steps: 2 Entrance Stairs-Rails: Right, Left, Can reach both Home Layout: One level Bathroom Shower/Tub: Psychologist, counselling, Engineer, manufacturing systems: Handicapped height Home Equipment: Agricultural consultant (2 wheels), Shower seat, BSC/3in1, Grab bars - toilet (hurry-cane)  Functional History: Prior Function Prior Level of Function : Independent/Modified Independent, Driving Mobility Comments: Ambulates with AD. No falls in the past 21mo. ADLs Comments: Indep with ADLs. Wife manages medications. Both share household chores. Functional Status:  Mobility: Bed Mobility Overal bed mobility: Needs  Assistance Bed Mobility: Supine to Sit Supine to sit: HOB elevated, Used rails, Supervision General bed mobility comments: Pt sat up on R side of bed with increased time. Assist to manage lines/leads. Transfers Overall transfer level: Needs assistance Equipment used: Rolling walker (2 wheels) Transfers: Sit to/from Stand, Bed to chair/wheelchair/BSC Sit to Stand: Mod assist Bed to/from chair/wheelchair/BSC transfer type:: Step pivot Step pivot transfers: Min assist General transfer comment: Pt stood from lowest bed height. Cued proper hand placement using RW. Mod A for initial momentum to get to standing. Intermittent verbal cues for L Heel WB Ambulation/Gait Ambulation/Gait assistance: Min assist Gait Distance (Feet): 3 Feet Assistive device: Rolling walker (2 wheels) Gait Pattern/deviations: Step-to pattern, Decreased step length - right, Decreased step length - left, Decreased stance time - left, Decreased weight shift to left General Gait Details: Pt took short slow steps. Cues for sequencing and heel WB . Assist to support trunk, manuever RW, and manage lines/leads. Gait velocity: decreased Gait velocity interpretation: <1.31 ft/sec, indicative of household ambulator    ADL:    Cognition: Cognition Orientation Level: Oriented X4 Cognition Arousal: Alert Behavior During Therapy: WFL for tasks assessed/performed   Review of Systems  Constitutional:  Negative for chills and fever.  HENT:  Negative for hearing loss.   Eyes:  Positive for blurred vision. Negative for double vision.  Respiratory:  Negative for cough and shortness of breath.   Cardiovascular:  Negative for chest pain.  Gastrointestinal:  Negative for abdominal pain, constipation, diarrhea, nausea and vomiting.  Genitourinary: Negative.   Musculoskeletal:  Positive for joint pain.  Skin:  Negative for rash.  Neurological:  Positive for weakness. Negative for dizziness, sensory change, focal weakness and  headaches.  Past Medical History:  Diagnosis Date   Abdominopelvic abscess (HCC) 09/29/2018   Abscess of appendix 09/22/2018   Acquired spondylolisthesis of lumbosacral region 03/20/2020   Acute cholecystitis 11/15/2023   Acute kidney injury (HCC) 10/07/2018   Acute kidney injury superimposed on chronic kidney disease (HCC) 11/06/2020   Acute kidney injury superimposed on CKD (HCC) 02/18/2020   Acute on chronic systolic (congestive) heart failure (HCC) 07/26/2015   Formatting of this note might be different from the original. Formatting of this note might be different from the original. Formatting of this note might be different from the original. revenkar EF 35% Formatting of this note might be different from the original. revenkar EF 35%     Acute on chronic systolic CHF (congestive heart failure) (HCC) 08/12/2023   Anasarca 07/01/2023   Anemia of chronic disease 08/18/2019   Anemia, chronic disease 08/18/2019   Aortic atherosclerosis (HCC)    Aortic stenosis    a.) TTE 09/15/2018: mild-mod (MPG 19); b.) TTE 03/21/2021: mild-mod (MPG 14.3); c.) TTE 04/24/2022: mild- mod (MPG 15)   Aortic stenosis, moderate 10/15/2019   Appendicitis 09/08/2018   ARF (acute renal failure) (HCC) 02/19/2020   Asthma    Atherosclerosis of native arteries of the extremities with ulceration (HCC) 02/24/2018   Formatting of this note might be different from the original.  Last Assessment & Plan:   His ABIs today are 1.07 on the right and 0.99 on the left with biphasic waveforms.  Although these pressures may be somewhat elevated from medial calcification, his flow does appear to be reasonably good.  His left ABI was 0.58 prior to intervention.  At this point, we will stretch out his follow-up and see hi   Benign hypertension with CKD (chronic kidney disease) stage IV (HCC) 02/24/2018   Benign prostatic hyperplasia without lower urinary tract symptoms 01/14/2018   Bilateral lower extremity edema 11/04/2018    Bradycardia 03/25/2018   Bruit of right carotid artery 07/26/2015   CAD (coronary artery disease) 2003   a.) s/p 4v CABG 2003   Cardiac murmur 07/26/2015   Choledocholithiasis 11/12/2023   Chronic combined systolic and diastolic CHF (congestive heart failure) (HCC) 07/26/2015   a.) TTE 09/15/2018: EF 50-55%, mod LVH, RVE, BAE, mild-mod TR, AoV sclerosis, G1DD; b.) MPI 10/27/2019: EF <30%; c.) TTE 03/21/2021: EF 35-40%, post AK, inf HK, mod LVH, mod red RVSF, mod LAE, mod Aov sclerosis, G2DD; c.) TTE 04/24/2022: EF 35-40%, post AK, glok HK, mod LVE, mod red RVSF, mild-mod MR, Aov sclerosis, G3DD   Chronic idiopathic constipation 11/02/2018   Chronic low back pain without sciatica 12/02/2018   Chronic pain of right hip 03/31/2020   Chronic systolic heart failure (HCC) 07/26/2015   Formatting of this note might be different from the original. revenkar EF 35%     CKD (chronic kidney disease) stage 4, GFR 15-29 ml/min (HCC) 10/07/2018   Congestive heart failure (HCC) 07/26/2015   Last Assessment & Plan:   Poor cardiac function could certainly be contributing to poor blood supply to the feet and toes as well.     Formatting of this note might be different from the original.  Formatting of this note might be different from the original.  Last Assessment & Plan:   Poor cardiac function could certainly be contributing to poor blood supply to the feet and toes as well.     Last   Coronary artery disease    Diabetes mellitus without complication (HCC)    Dysphagia 07/26/2015  Encounter for long-term (current) use of insulin  (HCC) 09/27/2020   Erectile dysfunction 07/14/2017   ESRD on dialysis [facility]) 11/15/2023   Essential hypertension 02/24/2018   Fever 11/07/2020   Foot ulcer (HCC) 09/27/2020   left foot  Formatting of this note might be different from the original.  Formatting of this note might be different from the original.  left foot     Foot ulcer, left (HCC)    GERD (gastroesophageal reflux  disease)    Heart murmur 07/26/2015   Hematuria 07/27/2015   High risk medication use 07/26/2015   History of marijuana use    Hyperkalemia 05/29/2020   Hyperlipidemia 07/26/2015   Hyperlipidemia associated with type 2 diabetes mellitus (HCC) 02/18/2020   Hypertension    Hypertension associated with diabetes (HCC)    Hypoglycemia 05/07/2019   Hypothyroidism 11/14/2015   Hypothyroidism (acquired) 11/14/2015   Insomnia 11/02/2018   Kidney insufficiency    Lumbar spondylosis 03/20/2020   Malaise and fatigue 03/25/2018   Malnutrition of moderate degree 08/20/2023   Mitral regurgitation 05/12/2019   Moderate aortic regurgitation 05/12/2019   Myocardial infarction due to demand ischemia (HCC) 09/26/2020   a.) Type II NSTEMI; b.) troponins were trended 0.54 --> 0.56 --> 0.52 ng/mL   Non-compliance 05/05/2019   NSTEMI (non-ST elevated myocardial infarction) (HCC) 09/27/2020   Osseous and subluxation stenosis of intervertebral foramina of lumbar region 03/20/2020   Osteoarthritis 10/06/2019   Pancytopenia (HCC) 08/12/2023   Peripheral vascular disease (HCC)    Personal history of tobacco use, presenting hazards to health 09/27/2020   Polyneuropathy in diabetes (HCC) 05/07/2019   Primary osteoarthritis involving multiple joints 10/06/2019   Primary osteoarthritis of right hip 02/21/2020   Pulmonary HTN (HCC)    a.) TTE 05/12/2019: PASP 71; b.) TTE 09/27/2020: PASP >70; c.) TTE 03/21/2021: RVSP 43; d.) TTE 04/24/2022: RVSP 80.6   Puncture wound of right hip 04/02/2018   PVD (peripheral vascular disease) with claudication (HCC)    a.) s/p PTA 03/02/2018 - balloon angioplasty LEFT below knee popliteal artery; b.) s/p PTA 09/30/2022: baloon angioplasty LEFT tibioperoneal trunck, most proximal peroneal artery, and LEFT popliteal artery.   Regional wall motion abnormality of heart 05/07/2019   Right groin pain 02/18/2020   S/P CABG x 4 2003   S/P TAVR (transcatheter aortic valve replacement)  01/20/2024   TAVR with a 29 mm Edwards Sapien 3 Ultra Resilia THV via the TF by Dr. Wendel and Dr. Shyrl   Screening for colon cancer 02/18/2018   Sepsis (HCC) 11/15/2023   Sleep apnea 07/26/2015   a.) does not require nocturnal PAP therapy   Subacute osteomyelitis of left foot (HCC) 03/25/2018   Thrombocytopenia (HCC)    Type 2 diabetes mellitus with hyperlipidemia (HCC) 07/26/2015   Type 2 diabetes mellitus with stage 4 chronic kidney disease, with long-term current use of insulin  (HCC) 07/26/2015   Type II diabetes mellitus with neurological manifestations (HCC) 02/18/2020   Vitamin B12 deficiency 06/16/2019   Vitamin D deficiency 05/07/2018   Past Surgical History:  Procedure Laterality Date   AMPUTATION Left 01/14/2024   Procedure: AMPUTATION, FOOT, PARTIAL;  Surgeon: Serene Gaile ORN, MD;  Location: [facility] OR;  Service: Vascular;  Laterality: Left;   AMPUTATION TOE Left 03/13/2018   Procedure: AMPUTATION TOE-MPJ;  Surgeon: Ashley Soulier, DPM;  Location: [facility] ORS;  Service: Podiatry;  Laterality: Left;   ANGIOPLASTY     AV FISTULA PLACEMENT Left 08/20/2023   Procedure: ARTERIOVENOUS (AV) FISTULA CREATION;  Surgeon: Gretta Lonni PARAS,  MD;  Location: [facility] OR;  Service: Vascular;  Laterality: Left;   CARDIAC CATHETERIZATION     CORONARY ARTERY BYPASS GRAFT N/A 2003   INTRAOPERATIVE TRANSTHORACIC ECHOCARDIOGRAM N/A 01/20/2024   Procedure: ECHOCARDIOGRAM, TRANSTHORACIC;  Surgeon: Wendel Lurena POUR, MD;  Location: [facility];  Service: Cardiovascular;  Laterality: N/A;   IR CATHETER TUBE CHANGE  12/31/2023   IR FLUORO GUIDE CV LINE RIGHT  08/18/2023   IR PERC CHOLECYSTOSTOMY  11/12/2023   IR RADIOLOGIST EVAL & MGMT  12/31/2023   IR US  GUIDE VASC ACCESS RIGHT  08/18/2023   LAPAROSCOPIC APPENDECTOMY N/A 09/08/2018   Procedure: APPENDECTOMY LAPAROSCOPIC;  Surgeon: Jordis Laneta FALCON, MD;  Location: [facility] ORS;  Service: General;  Laterality: N/A;   LOWER EXTREMITY ANGIOGRAPHY Left  03/02/2018   Procedure: LOWER EXTREMITY ANGIOGRAPHY;  Surgeon: Marea Selinda RAMAN, MD;  Location: [facility];  Service: Cardiovascular;  Laterality: Left;   LOWER EXTREMITY ANGIOGRAPHY Left 09/30/2022   Procedure: Lower Extremity Angiography;  Surgeon: Marea Selinda RAMAN, MD;  Location: [facility];  Service: Cardiovascular;  Laterality: Left;   LOWER EXTREMITY ANGIOGRAPHY Left 12/26/2023   Procedure: Lower Extremity Angiography;  Surgeon: Marea Selinda RAMAN, MD;  Location: [facility];  Service: Cardiovascular;  Laterality: Left;   RIGHT/LEFT HEART CATH AND CORONARY/GRAFT ANGIOGRAPHY N/A 08/01/2023   Procedure: RIGHT/LEFT HEART CATH AND CORONARY/GRAFT ANGIOGRAPHY;  Surgeon: Wendel Lurena POUR, MD;  Location: [facility];  Service: Cardiovascular;  Laterality: N/A;   ROTATOR CUFF REPAIR Left    TRANSMETATARSAL AMPUTATION Left 01/16/2024   Procedure: AMPUTATION, FOOT, TRANSMETATARSAL;  Surgeon: Serene Gaile ORN, MD;  Location: [facility];  Service: Vascular;  Laterality: Left;   Family History  Problem Relation Age of Onset   Hyperlipidemia Mother    Hypertension Mother    Diabetes Mother    Heart attack Brother    Heart disease Brother    Lung disease Father    Social History:  reports that he quit smoking about 22 years ago. His smoking use included cigarettes. He started smoking about 47 years ago. He has a 12.5 pack-year smoking history. He has never used smokeless tobacco. He reports that he does not currently use alcohol. He reports that he does not use drugs. Allergies: No Known Allergies Facility-Administered Medications Prior to Admission  Medication Dose Route Frequency Provider Last Rate Last Admin   sodium chloride  flush (NS) 0.9 % injection 10 mL  10 mL Intracatheter Q3 days PRN        Medications Prior to Admission  Medication Sig Dispense Refill   acetaminophen  (TYLENOL ) 500 MG tablet Take 1,000 mg by mouth every 6 (six) hours as needed for moderate pain or  headache.      aspirin  EC 81 MG tablet Take 1 tablet (81 mg total) by mouth daily. Swallow whole.     atorvastatin  (LIPITOR) 20 MG tablet Take 1 tablet (20 mg total) by mouth daily. 90 tablet 3   carvedilol  (COREG ) 3.125 MG tablet Take 1 tablet (3.125 mg total) by mouth 2 (two) times daily. 180 tablet 3   clopidogrel  (PLAVIX ) 75 MG tablet Take 1 tablet (75 mg total) by mouth daily. 30 tablet 11   gabapentin  (NEURONTIN ) 300 MG capsule Take 1 capsule (300 mg total) by mouth at bedtime. (Patient taking differently: Take 600 mg by mouth 2 (two) times daily.) 30 capsule 0   insulin  glargine (LANTUS ) 100 UNIT/ML injection Inject 7 Units into the skin daily as needed (  Diabetes). Inject 7 units at bedtime as needed after checking glucose     isosorbide  mononitrate (IMDUR ) 120 MG 24 hr tablet Take 1 tablet (120 mg total) by mouth daily. 90 tablet 3   latanoprost  (XALATAN ) 0.005 % ophthalmic solution Place 1 drop into both eyes at bedtime.     levothyroxine  (SYNTHROID ) 50 MCG tablet Take 50 mcg by mouth daily.     Methoxy PEG-Epoetin Beta (MIRCERA IJ) 30 mcg.     nitroGLYCERIN  (NITROSTAT ) 0.4 MG SL tablet Place 1 tablet (0.4 mg total) under the tongue every 5 (five) minutes as needed for chest pain. 25 tablet 6   sevelamer  carbonate (RENVELA ) 800 MG tablet Take 800 mg by mouth 3 (three) times daily with meals.     tamsulosin  (FLOMAX ) 0.4 MG CAPS capsule Take 0.4 mg by mouth daily after breakfast.     traMADol  (ULTRAM ) 50 MG tablet Take 50 mg by mouth every 8 (eight) hours as needed.     sodium chloride  flush (NS) 0.9 % SOLN Inject 5 mLs into the vein daily for 15 days. flush drain daily with 5 cc NS, record output daily, dressing changes every 2-3 days or earlier if soiled 75 mL 0   sodium chloride  flush 0.9 % SOLN injection 10 mLs by Intracatheter route every 3 (three) days. 100 mL 2     Blood pressure 125/65, pulse 73, temperature 97.7 F (36.5 C), temperature source Oral, resp. rate 19, height 6'  (1.829 m), weight 75.1 kg, SpO2 100%. Physical Exam  General: No apparent distress HEENT: Head is normocephalic, atraumatic, sclera anicteric, oral mucosa a little dry Neck: Supple without JVD or lymphadenopathy Heart: Reg rate and rhythm Chest: CTA bilaterally without wheezes, rales, or rhonchi; no distress Abdomen: Soft, non-tender, non-distended, bowel sounds positive. + chole drain R abdomen Extremities: No clubbing, cyanosis, or edema. + TMA with Prevena vac in place L foot Psych: Pt's affect is appropriate. Pt is cooperative Skin: Clean and intact without signs of breakdown Neuro:    Mental Status: AAOx3 Speech/Languate:  fluent, follows simple commands CRANIAL NERVES: 2-12 grossly intact   MOTOR: RUE: 4/5 Deltoid, 4/5 Biceps, 4/5 Triceps,4/5 Grip LUE: 4/5 Deltoid, 4/5 Biceps, 4/5 Triceps, 4/5 Grip RLE: HF 4-/5, KE 4/5, ADF 4/5, APF 4/5 LLE: HF 4-/5, KE 4/5, able to move ankle up and down  SENSORY: Normal to touch all 4 extremities  Coordination: no ataxia or dysmetria noted    Results for orders placed or performed during the hospital encounter of 01/14/24 (from the past 24 hours)  POCT Activated clotting time     Status: None   Collection Time: 01/20/24  5:31 PM  Result Value Ref Range   Activated Clotting Time 273 seconds  I-STAT, chem 8     Status: Abnormal   Collection Time: 01/20/24  5:52 PM  Result Value Ref Range   Sodium 124 (L) 135 - 145 mmol/L   Potassium 4.2 3.5 - 5.1 mmol/L   Chloride 86 (L) 98 - 111 mmol/L   BUN 58 (H) 8 - 23 mg/dL   Creatinine, Ser 1.59 (H) 0.61 - 1.24 mg/dL   Glucose, Bld 883 (H) 70 - 99 mg/dL   Calcium , Ion 1.02 (L) 1.15 - 1.40 mmol/L   TCO2 22 22 - 32 mmol/L   Hemoglobin 11.6 (L) 13.0 - 17.0 g/dL   HCT 65.9 (L) 60.9 - 47.9 %  Glucose, capillary     Status: Abnormal   Collection Time: 01/20/24  7:02 PM  Result  Value Ref Range   Glucose-Capillary 101 (H) 70 - 99 mg/dL  Glucose, capillary     Status: Abnormal   Collection  Time: 01/20/24  9:14 PM  Result Value Ref Range   Glucose-Capillary 102 (H) 70 - 99 mg/dL   Comment 1 Notify RN    Comment 2 Document in Chart   Basic metabolic panel with GFR     Status: Abnormal   Collection Time: 01/21/24  3:31 AM  Result Value Ref Range   Sodium 131 (L) 135 - 145 mmol/L   Potassium 4.8 3.5 - 5.1 mmol/L   Chloride 92 (L) 98 - 111 mmol/L   CO2 22 22 - 32 mmol/L   Glucose, Bld 109 (H) 70 - 99 mg/dL   BUN 72 (H) 8 - 23 mg/dL   Creatinine, Ser 89.25 (H) 0.61 - 1.24 mg/dL   Calcium  8.5 (L) 8.9 - 10.3 mg/dL   GFR, Estimated 5 (L) >60 mL/min   Anion gap 17 (H) 5 - 15  CBC     Status: Abnormal   Collection Time: 01/21/24  3:31 AM  Result Value Ref Range   WBC 11.0 (H) 4.0 - 10.5 K/uL   RBC 3.69 (L) 4.22 - 5.81 MIL/uL   Hemoglobin 11.2 (L) 13.0 - 17.0 g/dL   HCT 64.9 (L) 60.9 - 47.9 %   MCV 94.9 80.0 - 100.0 fL   MCH 30.4 26.0 - 34.0 pg   MCHC 32.0 30.0 - 36.0 g/dL   RDW 85.0 88.4 - 84.4 %   Platelets 197 150 - 400 K/uL   nRBC 0.0 0.0 - 0.2 %  Magnesium      Status: None   Collection Time: 01/21/24  3:31 AM  Result Value Ref Range   Magnesium  2.2 1.7 - 2.4 mg/dL  Glucose, capillary     Status: Abnormal   Collection Time: 01/21/24  6:14 AM  Result Value Ref Range   Glucose-Capillary 109 (H) 70 - 99 mg/dL   Comment 1 Notify RN    Comment 2 Document in Chart   Glucose, capillary     Status: Abnormal   Collection Time: 01/21/24 11:42 AM  Result Value Ref Range   Glucose-Capillary 121 (H) 70 - 99 mg/dL   ECHOCARDIOGRAM COMPLETE Result Date: 01/21/2024    ECHOCARDIOGRAM REPORT   Patient Name:   Phillip [name] Date of Exam: 01/21/2024 Medical Rec #:  [id number]        Height:       72.0 in Accession #:    [id number]       Weight:       165.6 lb Date of Birth:  11-28-1955       BSA:          1.966 m Patient Age:    67 years         BP:           128/75 mmHg Patient Gender: M                HR:           77 bpm. Exam Location:  Inpatient Procedure: 2D Echo, Cardiac  Doppler and Color Doppler (Both Spectral and Color            Flow Doppler were utilized during procedure). Indications:    Post TAVR evaluation. ; I35.0 Nonrheumatic aortic (valve)                 stenosis  History:  Patient has prior history of Echocardiogram examinations, most                 recent 01/20/2024. CHF, CAD, Prior CABG, Pulmonary HTN, Aortic                 Valve Disease, Signs/Symptoms:Edema; Risk Factors:Hypertension                 and Dyslipidemia. ESRD.                 Aortic Valve: 29 mm Edwards Sapien prosthetic, stented (TAVR)                 valve is present in the aortic position. Procedure Date:                 01/20/2024.  Sonographer:    Ellouise Mose RDCS Referring Phys: [id number] [facility] R THOMPSON  Sonographer Comments: Technically difficult study due to poor echo windows. IMPRESSIONS  1. Left ventricular ejection fraction, by estimation, is 30 to 35%. The left ventricle has moderately decreased function. The left ventricle demonstrates regional wall motion abnormalities (see scoring diagram/findings for description). The left ventricular internal cavity size was moderately dilated. There is mild concentric left ventricular hypertrophy. Left ventricular diastolic parameters are consistent with Grade I diastolic dysfunction (impaired relaxation).  2. The mitral valve is degenerative. Mild to moderate mitral valve regurgitation. No evidence of mitral stenosis. Moderate mitral annular calcification.  3. The aortic valve has been repaired/replaced. There is a 29 mm Edwards Sapien prosthetic (TAVR) valve present in the aortic position. Procedure Date: 01/20/2024. Echo findings are consistent with trivial perivalvular leak of the aortic prosthesis.  4. Right ventricular systolic function is moderately reduced. The right ventricular size is normal.  5. Left atrial size was mildly dilated. FINDINGS  Left Ventricle: Left ventricular ejection fraction, by estimation, is 30 to 35%. The left  ventricle has moderately decreased function. The left ventricle demonstrates regional wall motion abnormalities. The left ventricular internal cavity size was moderately dilated. There is mild concentric left ventricular hypertrophy. Left ventricular diastolic parameters are consistent with Grade I diastolic dysfunction (impaired relaxation).  LV Wall Scoring: The inferior wall and posterior wall are akinetic. Right Ventricle: The right ventricular size is normal. No increase in right ventricular wall thickness. Right ventricular systolic function is moderately reduced. Left Atrium: Left atrial size was mildly dilated. Right Atrium: Right atrial size was normal in size. Pericardium: There is no evidence of pericardial effusion. Mitral Valve: The mitral valve is degenerative in appearance. Moderate mitral annular calcification. Mild to moderate mitral valve regurgitation. No evidence of mitral valve stenosis. MV peak gradient, 7.8 mmHg. The mean mitral valve gradient is 3.0 mmHg. Tricuspid Valve: The tricuspid valve is normal in structure. Tricuspid valve regurgitation is trivial. No evidence of tricuspid stenosis. Aortic Valve: Trivial perivalvular leak and 2 o'clock. The aortic valve has been repaired/replaced. Aortic valve regurgitation is not visualized. No aortic stenosis is present. Aortic valve mean gradient measures 5.0 mmHg. Aortic valve peak gradient measures 10.2 mmHg. Aortic valve area, by VTI measures 4.55 cm. There is a 29 mm Edwards Sapien prosthetic, stented (TAVR) valve present in the aortic position. Procedure Date: 01/20/2024. Echo findings are consistent with normal structure and function of the aortic valve prosthesis. Pulmonic Valve: The pulmonic valve was normal in structure. Pulmonic valve regurgitation is not visualized. No evidence of pulmonic stenosis. Aorta: The aortic root and ascending aorta are structurally normal, with no evidence of dilitation.  IAS/Shunts: There is redundancy of the  interatrial septum. No atrial level shunt detected by color flow Doppler.  LEFT VENTRICLE PLAX 2D LVIDd:         5.18 cm      Diastology LVIDs:         4.80 cm      LV e' medial:    3.92 cm/s LV PW:         1.28 cm      LV E/e' medial:  22.8 LV IVS:        1.39 cm      LV e' lateral:   5.55 cm/s LVOT diam:     2.90 cm      LV E/e' lateral: 16.1 LV SV:         124 LV SV Index:   63 LVOT Area:     6.61 cm  LV Volumes (MOD) LV vol d, MOD A2C: 277.0 ml LV vol d, MOD A4C: 198.0 ml LV vol s, MOD A2C: 206.0 ml LV vol s, MOD A4C: 138.0 ml LV SV MOD A2C:     71.0 ml LV SV MOD A4C:     198.0 ml LV SV MOD BP:      71.8 ml RIGHT VENTRICLE            IVC RV S prime:     7.40 cm/s  IVC diam: 1.70 cm TAPSE (M-mode): 0.7 cm LEFT ATRIUM             Index        RIGHT ATRIUM           Index LA diam:        3.96 cm 2.01 cm/m   RA Area:     16.20 cm LA Vol (A2C):   38.2 ml 19.43 ml/m  RA Volume:   46.30 ml  23.55 ml/m LA Vol (A4C):   28.2 ml 14.34 ml/m LA Biplane Vol: 32.4 ml 16.48 ml/m  AORTIC VALVE AV Area (Vmax):    5.08 cm AV Area (Vmean):   4.73 cm AV Area (VTI):     4.55 cm AV Vmax:           160.00 cm/s AV Vmean:          100.467 cm/s AV VTI:            0.273 m AV Peak Grad:      10.2 mmHg AV Mean Grad:      5.0 mmHg LVOT Vmax:         123.00 cm/s LVOT Vmean:        71.900 cm/s LVOT VTI:          0.188 m LVOT/AV VTI ratio: 0.69  AORTA Ao Root diam: 3.51 cm Ao Asc diam:  3.59 cm MITRAL VALVE MV Area (PHT): 4.15 cm     SHUNTS MV Area VTI:   3.42 cm     Systemic VTI:  0.19 m MV Peak grad:  7.8 mmHg     Systemic Diam: 2.90 cm MV Mean grad:  3.0 mmHg MV Vmax:       1.40 m/s MV Vmean:      88.5 cm/s MV Decel Time: 183 msec MV E velocity: 89.40 cm/s MV A velocity: 127.00 cm/s MV E/A ratio:  0.70 Toribio Fuel MD Electronically signed by Toribio Fuel MD Signature Date/Time: 01/21/2024/12:14:45 PM    Final    ECHOCARDIOGRAM LIMITED Result Date: 01/20/2024    ECHOCARDIOGRAM LIMITED REPORT  Patient Name:   Phillip [name] Date of Exam: 01/20/2024 Medical Rec #:  [id number]        Height:       72.0 in Accession #:    [id number]       Weight:       165.8 lb Date of Birth:  [birth year]       BSA:          1.967 m Patient Age:    67 years         BP:           105/60 mmHg Patient Gender: M                HR:           61 bpm. Exam Location:  Inpatient Procedure: Limited Color Doppler (Both Spectral and Color Flow Doppler were            utilized during procedure). Indications:     aortic stenosis. TAVR  History:         Patient has prior history of Echocardiogram examinations, most                  recent 08/01/2023. CAD, Prior CABG, end stage renal disease;                  Risk Factors:Hypertension, Dyslipidemia, Diabetes and Sleep                  Apnea.                  Aortic Valve: 29 mm valve is present in the aortic position.                  Procedure Date: 01/20/2024.  Sonographer:     Tinnie Barefoot RDCS Referring Phys:  [id number] LAMARR SAUNDERS THOMPSON Diagnosing Phys: Darryle Decent MD IMPRESSIONS  1. Echo guided TAVR. Vmax 1.21 m/s, MG 3.0 mmHG, EOA 4.57 cm2. No regurgitation or paravalvular leak. Aortic valve regurgitation is not visualized. There is a 29 mm valve present in the aortic position. Procedure Date: 01/20/2024. Aortic valve area, by VTI measures 4.57 cm. Aortic valve mean gradient measures 3.0 mmHg. Aortic valve Vmax measures 1.21 m/s.  2. Left ventricular ejection fraction, by estimation, is 30 to 35%. The left ventricle has moderately decreased function.  3. Right ventricular systolic function is normal. The right ventricular size is normal.  4. The mitral valve is degenerative. Mild mitral valve regurgitation. No evidence of mitral stenosis. FINDINGS  Left Ventricle: Left ventricular ejection fraction, by estimation, is 30 to 35%. The left ventricle has moderately decreased function.  LV Wall Scoring: The inferior wall and posterior wall are akinetic. Right Ventricle: The right ventricular size is normal. No  increase in right ventricular wall thickness. Right ventricular systolic function is normal. Pericardium: There is no evidence of pericardial effusion. Mitral Valve: The mitral valve is degenerative in appearance. Mild mitral valve regurgitation. No evidence of mitral valve stenosis. Tricuspid Valve: The tricuspid valve is grossly normal. Tricuspid valve regurgitation is mild . No evidence of tricuspid stenosis. Aortic Valve: Echo guided TAVR. Vmax 1.21 m/s, MG 3.0 mmHG, EOA 4.57 cm2. No regurgitation or paravalvular leak. Aortic valve regurgitation is not visualized. Aortic regurgitation PHT measures 446 msec. Aortic valve mean gradient measures 3.0 mmHg. Aortic valve peak gradient measures 5.9 mmHg. Aortic valve area, by VTI measures 4.57 cm. There is a 29 mm valve present in the  aortic position. Procedure Date: 01/20/2024. Aorta: The aortic root and ascending aorta are structurally normal, with no evidence of dilitation. Additional Comments: Spectral Doppler performed. Color Doppler performed.  LEFT VENTRICLE PLAX 2D LVIDd:         5.30 cm LV PW:         1.00 cm LV IVS:        1.20 cm LVOT diam:     2.68 cm LV SV:         105 LV SV Index:   54 LVOT Area:     5.64 cm  LV Volumes (MOD) LV vol d, MOD A4C: 181.0 ml LV vol s, MOD A4C: 117.0 ml LV SV MOD A4C:     181.0 ml LEFT ATRIUM         Index LA diam:    3.70 cm 1.88 cm/m  AORTIC VALVE AV Area (Vmax):    4.90 cm AV Area (Vmean):   4.80 cm AV Area (VTI):     4.57 cm AV Vmax:           121.00 cm/s AV Vmean:          80.400 cm/s AV VTI:            0.231 m AV Peak Grad:      5.9 mmHg AV Mean Grad:      3.0 mmHg LVOT Vmax:         105.00 cm/s LVOT Vmean:        68.400 cm/s LVOT VTI:          0.187 m LVOT/AV VTI ratio: 0.81 AI PHT:            446 msec  AORTA Ao Root diam: 3.40 cm Ao Asc diam:  3.30 cm  SHUNTS Systemic VTI:  0.19 m Systemic Diam: 2.68 cm Darryle Decent MD Electronically signed by Darryle Decent MD Signature Date/Time: 01/20/2024/6:03:09 PM    Final     Structural Heart Procedure Result Date: 01/20/2024 See surgical note for result.   Assessment/Plan: Diagnosis: Transmetatarsal amputation L foot and TAVR Does the need for close, 24 hr/day medical supervision in concert with the patient's rehab needs make it unreasonable for this patient to be served in a less intensive setting? Yes Co-Morbidities requiring supervision/potential complications:  - Severe aortic stenosis, HFrEF, LBBB, ESRD on HD, hypertension, cholecystitis requiring drain, anemia, CAD s/p CABG, diabetes mellitus type 2, hypothyroidism Due to bladder management, bowel management, safety, skin/wound care, disease management, medication administration, pain management, and patient education, does the patient require 24 hr/day rehab nursing? Yes Does the patient require coordinated care of a physician, rehab nurse, therapy disciplines of Pt/OT to address physical and functional deficits in the context of the above medical diagnosis(es)? Yes Addressing deficits in the following areas: balance, endurance, locomotion, strength, transferring, bowel/bladder control, bathing, dressing, feeding, grooming, toileting, and psychosocial support Can the patient actively participate in an intensive therapy program of at least 3 hrs of therapy per day at least 5 days per week? Yes The potential for patient to make measurable gains while on inpatient rehab is excellent Anticipated functional outcomes upon discharge from inpatient rehab are modified independent and supervision  with PT, modified independent and supervision with OT, n/a with SLP. Estimated rehab length of stay to reach the above functional goals is: 7 days Anticipated discharge destination: Home Overall Rehab/Functional Prognosis: excellent  POST ACUTE RECOMMENDATIONS: This patient's condition is appropriate for continued rehabilitative care in the following setting: CIR Patient  has agreed to participate in recommended program.  Yes Note that insurance prior authorization may be required for reimbursement for recommended care.  Comment: Patient is a good candidate for CIR, Rehab coordinator to follow up.    I have personally performed a face to face diagnostic evaluation of this patient. Additionally, I have examined the patient's medical record including any pertinent labs and radiographic images.    Thanks,  Murray Collier, MD 01/21/2024

## 2024-01-21 NOTE — Discharge Instructions (Signed)
 ACTIVITY AND EXERCISE  Daily activity and exercise are an important part of your recovery. People recover at different rates depending on their general health and type of valve procedure.  Most people recovering from TAVR feel better relatively quickly   No lifting, pushing, pulling more than 10 pounds (examples to avoid: groceries, vacuuming, gardening, golfing):             - For one week with a procedure through the groin.             - For six weeks for procedures through the chest wall or neck. NOTE: You will typically see one of our providers 7-14 days after your procedure to discuss WHEN TO RESUME the above activities.      DRIVING  Do not drive until you are seen for follow up and cleared by a provider. Generally, we ask patient to not drive for 1 week after their procedure.  If you have been told by your doctor in the past that you may not drive, you must talk with him/her before you begin driving again.   DRESSING  Groin site: you may leave the clear dressing over the site for up to one week or until it falls off.   HYGIENE  If you had a femoral (leg) procedure, you may take a shower when you return home. After the shower, pat the site dry. Do NOT use powder, oils or lotions in your groin area until the site has completely healed.  If you had a chest procedure, you may shower when you return home unless specifically instructed not to by your discharging practitioner.             - DO NOT scrub incision; pat dry with a towel.             - DO NOT apply any lotions, oils, powders to the incision.             - No tub baths / swimming for at least 2 weeks.  If you notice any fevers, chills, increased pain, swelling, bleeding or pus, please contact your doctor.   ADDITIONAL INFORMATION  If you are going to have an upcoming dental procedure, please contact our office as you will require antibiotics ahead of time to prevent infection on your heart valve.    If you have any questions  or concerns you can call the structural heart phone during normal business hours 8am-4pm. If you have an urgent need after hours or weekends please call [phone] to talk to the on call provider for general cardiology. If you have an emergency that requires immediate attention, please call 911.    After TAVR Checklist  Check  Test Description   Follow up appointment in 1-2 weeks  You will see our structural heart advanced practice provider. Your incision sites will be checked and you will be cleared to drive and resume all normal activities if you are doing well.     1 month echo and follow up  You will have an echo to check on your new heart valve and be seen back in the office by a structural heart advanced practice provider.   Follow up with your primary cardiologist You will need to be seen by your primary cardiologist in the following 3-6 months after your 1 month appointment in the valve clinic.    1 year echo and follow up You will have another echo to check on your heart valve after 1 year  and be seen back in the office by a structural heart advanced practice provider. This your last structural heart visit.   Bacterial endocarditis prophylaxis  You will have to take antibiotics for the rest of your life before all dental procedures (even teeth cleanings) to protect your heart valve. Antibiotics are also required before some surgeries. Please check with your cardiologist before scheduling any surgeries. Also, please make sure to tell us  if you have a penicillin allergy as you will require an alternative antibiotic.

## 2024-01-22 ENCOUNTER — Other Ambulatory Visit: Payer: Self-pay

## 2024-01-22 DIAGNOSIS — I2510 Atherosclerotic heart disease of native coronary artery without angina pectoris: Secondary | ICD-10-CM | POA: Diagnosis not present

## 2024-01-22 DIAGNOSIS — Z952 Presence of prosthetic heart valve: Secondary | ICD-10-CM | POA: Diagnosis not present

## 2024-01-22 DIAGNOSIS — I10 Essential (primary) hypertension: Secondary | ICD-10-CM | POA: Diagnosis not present

## 2024-01-22 DIAGNOSIS — I5042 Chronic combined systolic (congestive) and diastolic (congestive) heart failure: Secondary | ICD-10-CM

## 2024-01-22 DIAGNOSIS — I96 Gangrene, not elsewhere classified: Secondary | ICD-10-CM | POA: Diagnosis not present

## 2024-01-22 LAB — BASIC METABOLIC PANEL WITH GFR
Anion gap: 21 — ABNORMAL HIGH (ref 5–15)
BUN: 96 mg/dL — ABNORMAL HIGH (ref 8–23)
CO2: 21 mmol/L — ABNORMAL LOW (ref 22–32)
Calcium: 8.2 mg/dL — ABNORMAL LOW (ref 8.9–10.3)
Chloride: 90 mmol/L — ABNORMAL LOW (ref 98–111)
Creatinine, Ser: 12.08 mg/dL — ABNORMAL HIGH (ref 0.61–1.24)
GFR, Estimated: 4 mL/min — ABNORMAL LOW (ref >60)
Glucose, Bld: 120 mg/dL — ABNORMAL HIGH (ref 70–99)
Potassium: 5.3 mmol/L — ABNORMAL HIGH (ref 3.5–5.1)
Sodium: 132 mmol/L — ABNORMAL LOW (ref 135–145)

## 2024-01-22 LAB — GLUCOSE, CAPILLARY
Glucose-Capillary: 134 mg/dL — ABNORMAL HIGH (ref 70–99)
Glucose-Capillary: 140 mg/dL — ABNORMAL HIGH (ref 70–99)
Glucose-Capillary: 159 mg/dL — ABNORMAL HIGH (ref 70–99)
Glucose-Capillary: 112 mg/dL — ABNORMAL HIGH (ref 70–99)

## 2024-01-22 LAB — CBC
HCT: 30.1 % — ABNORMAL LOW (ref 39.0–52.0)
Hemoglobin: 9.8 g/dL — ABNORMAL LOW (ref 13.0–17.0)
MCH: 30.6 pg (ref 26.0–34.0)
MCHC: 32.6 g/dL (ref 30.0–36.0)
MCV: 94.1 fL (ref 80.0–100.0)
Platelets: 169 K/uL (ref 150–400)
RBC: 3.2 MIL/uL — ABNORMAL LOW (ref 4.22–5.81)
RDW: 15.1 % (ref 11.5–15.5)
WBC: 12.6 K/uL — ABNORMAL HIGH (ref 4.0–10.5)
nRBC: 0 % (ref 0.0–0.2)

## 2024-01-22 NOTE — Progress Notes (Signed)
   01/22/24 1230  Vitals  Temp 98.4 F (36.9 C)  Pulse Rate 95  Resp 16  BP 109/69  SpO2 95 %  Weight 74 kg  Type of Weight Post-Dialysis  Oxygen Therapy  Patient Activity (if Appropriate) In bed  Pulse Oximetry Type Continuous  Post Treatment  Dialyzer Clearance Lightly streaked  Hemodialysis Intake (mL) 0 mL  Liters Processed 42.7  Fluid Removed (mL) 800 mL  Tolerated HD Treatment No (Comment)  Post-Hemodialysis Comments tx terminaed at 2.5hrs due to hypotension and high vp  AVG/AVF Arterial Site Held (minutes) 10 minutes  AVG/AVF Venous Site Held (minutes) 10 minutes   Received patient in bed to unit.  Alert and oriented.  Informed consent signed and in chart.   TX duration:2.5hrs  Patient tolerated well.  Transported back to the room  Alert, without acute distress.  Hand-off given to patient's nurse.   Access used: LAVG Access issues: high VP  Total UF removed: Medication(s) given: none    [facility] T [facility]

## 2024-01-22 NOTE — Progress Notes (Signed)
  Progress Note    01/22/2024 8:06 AM 2 Days Post-Op  Subjective:  no complaints    Vitals:   01/22/24 0217 01/22/24 0412  BP:  121/75  Pulse: 77 86  Resp: 14 20  Temp:  98.1 F (36.7 C)  SpO2: 97% 100%    Physical Exam: General:  alert and oriented x3 Lungs:  nonlabored Extremities:  Left TMA with prevena vac with good seal  CBC    Component Value Date/Time   WBC 12.6 (H) 01/22/2024 0347   RBC 3.20 (L) 01/22/2024 0347   HGB 9.8 (L) 01/22/2024 0347   HGB 11.5 (L) 05/16/2023 1429   HCT 30.1 (L) 01/22/2024 0347   HCT 35.3 (L) 05/16/2023 1429   PLT 169 01/22/2024 0347   PLT 144 (L) 05/16/2023 1429   MCV 94.1 01/22/2024 0347   MCV 90 05/16/2023 1429   MCH 30.6 01/22/2024 0347   MCHC 32.6 01/22/2024 0347   RDW 15.1 01/22/2024 0347   RDW 13.5 05/16/2023 1429   LYMPHSABS 0.7 11/11/2023 1930   LYMPHSABS 0.7 05/16/2023 1429   MONOABS 0.8 11/11/2023 1930   EOSABS 0.0 11/11/2023 1930   EOSABS 0.1 05/16/2023 1429   BASOSABS 0.0 11/11/2023 1930   BASOSABS 0.0 05/16/2023 1429    BMET    Component Value Date/Time   NA 132 (L) 01/22/2024 0347   NA 145 (H) 07/14/2023 1412   NA 136 08/30/2013 1025   K 5.3 (H) 01/22/2024 0347   K 3.7 08/30/2013 1025   CL 90 (L) 01/22/2024 0347   CL 101 08/30/2013 1025   CO2 21 (L) 01/22/2024 0347   CO2 31 08/30/2013 1025   GLUCOSE 120 (H) 01/22/2024 0347   GLUCOSE 310 (H) 08/30/2013 1025   BUN 96 (H) 01/22/2024 0347   BUN 97 (HH) 07/14/2023 1412   BUN 17 08/30/2013 1025   CREATININE 12.08 (H) 01/22/2024 0347   CREATININE 1.15 08/30/2013 1025   CALCIUM  8.2 (L) 01/22/2024 0347   CALCIUM  9.4 08/30/2013 1025   GFRNONAA 4 (L) 01/22/2024 0347   GFRNONAA >60 08/30/2013 1025   GFRAA 27 (L) 02/22/2020 0539   GFRAA >60 08/30/2013 1025    INR    Component Value Date/Time   INR 1.1 01/19/2024 1844     Intake/Output Summary (Last 24 hours) at 01/22/2024 0806 Last data filed at 01/21/2024 1600 Gross per 24 hour  Intake 240 ml   Output --  Net 240 ml      Assessment/Plan:  68 y.o. male is 6 days post op, s/p: left TMA   - He has no complaints this morning.  He says that his left foot feels okay -He is recovering well after TAVR on 8/19 -Left TMA with Prevena VAC with good seal.  This is likely ready to be removed tomorrow. -Continue to mobilize as tolerated   Ahmed Holster, PA-C [facility] [phone] 01/22/2024 8:06 AM

## 2024-01-22 NOTE — Plan of Care (Signed)
  Problem: Education: Goal: Knowledge of General Education information will improve Description: Including pain rating scale, medication(s)/side effects and non-pharmacologic comfort measures Outcome: Progressing   Problem: Clinical Measurements: Goal: Ability to maintain clinical measurements within normal limits will improve Outcome: Progressing Goal: Will remain free from infection Outcome: Progressing Goal: Respiratory complications will improve Outcome: Progressing Goal: Cardiovascular complication will be avoided Outcome: Progressing   Problem: Activity: Goal: Risk for activity intolerance will decrease Outcome: Progressing   Problem: Coping: Goal: Level of anxiety will decrease Outcome: Progressing   Problem: Pain Managment: Goal: General experience of comfort will improve and/or be controlled Outcome: Progressing   Problem: Safety: Goal: Ability to remain free from injury will improve Outcome: Progressing

## 2024-01-22 NOTE — PMR Pre-admission (Signed)
 PMR Admission Coordinator Pre-Admission Assessment  Patient: Phillip [name] is an 68 y.o., male MRN: [id number] DOB: 06-Dec-1955 Height: 6' (182.9 cm) Weight: 74.9 kg              Insurance Information HMO:     PPO: yes     PCP:      IPA:      80/20:      OTHER:  PRIMARY: [employer] PPO      Policy#: [id number]      Subscriber: patient CM Name: Ellouise Pouch      Phone#: [phone]     Fax#: [phone] Pre-Cert#: [id number] from Ellouise Pouch approved for 7 days; HTA case manager has EPIC access      Employer: disabled Benefits:  Phone #: [phone]     Name: Verified by phone with Ezella Gemma. Date: 06/04/23     Deduct: $0      Out of Pocket Max: $3400 (met $3400)      Life Max: n/a  CIR: $325/day for days 1-6      SNF: $0 for days 1-20; $214/day for days 21-100 Outpatient: med nec     Co-Pay: $15/visit Home Health: 100%      Co-Pay: none DME: 75%     Co-Pay: 15% Providers: in network  SECONDARY:       Policy#:       Phone#:   Artist:       Phone#:   The Data processing manager" for patients in Inpatient Rehabilitation Facilities with attached "Privacy Act Statement-Health Care Records" was provided and verbally reviewed with: Patient  Emergency Contact Information Contact Information     Name Relation Home Work Mobile   Nickerson A Spouse [phone]  [phone]      Other Contacts   None on File    Current Medical History  Patient Admitting Diagnosis: Left foot TMR and TVAR  History of Present Illness:  A 68 y.o. male with past medical history of CHF, anemia, ESRD on HD on TTHSat, PVD, diabetes mellitus type 2, severe aortic stenosis, obesity, CAD s/p CABG, cholelithiasis.  Patient was initially admitted to [facility] on 01/14/24 for gangrenous left third toe.  On 8/13 he had left third toe amputation including metatarsal head by Dr. Serene.  It was felt the patient may require TMA or BKA however patient refused initially.   Patient later agreed to TMA and this was completed by Dr. Serene on 01/16/24.  Patient remained inpatient for planned TAVR for severe aortic stenosis.  TAVR was completed on 01/20/2024 via TF approach per Dr. Shyrl.  TMA was not healing and it was not felt limb was salvageable.  He underwent L BKA per Dr. Serene on 8/29.  Therapy has been ongoing and recommendations are for CIR due to functional decline.     Glasgow Coma Scale Score: 15  Patient's medical record from [facility] has been reviewed by the rehabilitation admission coordinator and physician.  Past Medical History  Past Medical History:  Diagnosis Date   Abdominopelvic abscess (HCC) 09/29/2018   Abscess of appendix 09/22/2018   Acquired spondylolisthesis of lumbosacral region 03/20/2020   Acute cholecystitis 11/15/2023   Acute kidney injury (HCC) 10/07/2018   Acute kidney injury superimposed on chronic kidney disease (HCC) 11/06/2020   Acute kidney injury superimposed on CKD (HCC) 02/18/2020   Acute on chronic systolic (congestive) heart failure (HCC) 07/26/2015   Formatting of this note might be different from the original. Formatting  of this note might be different from the original. Formatting of this note might be different from the original. revenkar EF 35% Formatting of this note might be different from the original. revenkar EF 35%     Acute on chronic systolic CHF (congestive heart failure) (HCC) 08/12/2023   Anasarca 07/01/2023   Anemia of chronic disease 08/18/2019   Anemia, chronic disease 08/18/2019   Aortic atherosclerosis (HCC)    Aortic stenosis    a.) TTE 09/15/2018: mild-mod (MPG 19); b.) TTE 03/21/2021: mild-mod (MPG 14.3); c.) TTE 04/24/2022: mild- mod (MPG 15)   Aortic stenosis, moderate 10/15/2019   Appendicitis 09/08/2018   ARF (acute renal failure) (HCC) 02/19/2020   Asthma    Atherosclerosis of native arteries of the extremities with ulceration (HCC) 02/24/2018   Formatting of this note  might be different from the original.  Last Assessment & Plan:   His ABIs today are 1.07 on the right and 0.99 on the left with biphasic waveforms.  Although these pressures may be somewhat elevated from medial calcification, his flow does appear to be reasonably good.  His left ABI was 0.58 prior to intervention.  At this point, we will stretch out his follow-up and see hi   Benign hypertension with CKD (chronic kidney disease) stage IV (HCC) 02/24/2018   Benign prostatic hyperplasia without lower urinary tract symptoms 01/14/2018   Bilateral lower extremity edema 11/04/2018   Bradycardia 03/25/2018   Bruit of right carotid artery 07/26/2015   CAD (coronary artery disease) 2003   a.) s/p 4v CABG 2003   Cardiac murmur 07/26/2015   Choledocholithiasis 11/12/2023   Chronic combined systolic and diastolic CHF (congestive heart failure) (HCC) 07/26/2015   a.) TTE 09/15/2018: EF 50-55%, mod LVH, RVE, BAE, mild-mod TR, AoV sclerosis, G1DD; b.) MPI 10/27/2019: EF <30%; c.) TTE 03/21/2021: EF 35-40%, post AK, inf HK, mod LVH, mod red RVSF, mod LAE, mod Aov sclerosis, G2DD; c.) TTE 04/24/2022: EF 35-40%, post AK, glok HK, mod LVE, mod red RVSF, mild-mod MR, Aov sclerosis, G3DD   Chronic idiopathic constipation 11/02/2018   Chronic low back pain without sciatica 12/02/2018   Chronic pain of right hip 03/31/2020   Chronic systolic heart failure (HCC) 07/26/2015   Formatting of this note might be different from the original. revenkar EF 35%     CKD (chronic kidney disease) stage 4, GFR 15-29 ml/min (HCC) 10/07/2018   Congestive heart failure (HCC) 07/26/2015   Last Assessment & Plan:   Poor cardiac function could certainly be contributing to poor blood supply to the feet and toes as well.     Formatting of this note might be different from the original.  Formatting of this note might be different from the original.  Last Assessment & Plan:   Poor cardiac function could certainly be contributing to poor blood  supply to the feet and toes as well.     Last   Coronary artery disease    Diabetes mellitus without complication (HCC)    Dysphagia 07/26/2015   Encounter for long-term (current) use of insulin  (HCC) 09/27/2020   Erectile dysfunction 07/14/2017   ESRD on dialysis [facility]) 11/15/2023   Essential hypertension 02/24/2018   Fever 11/07/2020   Foot ulcer (HCC) 09/27/2020   left foot  Formatting of this note might be different from the original.  Formatting of this note might be different from the original.  left foot     Foot ulcer, left (HCC)    GERD (gastroesophageal reflux disease)  Heart murmur 07/26/2015   Hematuria 07/27/2015   High risk medication use 07/26/2015   History of marijuana use    Hyperkalemia 05/29/2020   Hyperlipidemia 07/26/2015   Hyperlipidemia associated with type 2 diabetes mellitus (HCC) 02/18/2020   Hypertension    Hypertension associated with diabetes (HCC)    Hypoglycemia 05/07/2019   Hypothyroidism 11/14/2015   Hypothyroidism (acquired) 11/14/2015   Insomnia 11/02/2018   Kidney insufficiency    Lumbar spondylosis 03/20/2020   Malaise and fatigue 03/25/2018   Malnutrition of moderate degree 08/20/2023   Mitral regurgitation 05/12/2019   Moderate aortic regurgitation 05/12/2019   Myocardial infarction due to demand ischemia (HCC) 09/26/2020   a.) Type II NSTEMI; b.) troponins were trended 0.54 --> 0.56 --> 0.52 ng/mL   Non-compliance 05/05/2019   NSTEMI (non-ST elevated myocardial infarction) (HCC) 09/27/2020   Osseous and subluxation stenosis of intervertebral foramina of lumbar region 03/20/2020   Osteoarthritis 10/06/2019   Pancytopenia (HCC) 08/12/2023   Peripheral vascular disease (HCC)    Personal history of tobacco use, presenting hazards to health 09/27/2020   Polyneuropathy in diabetes (HCC) 05/07/2019   Primary osteoarthritis involving multiple joints 10/06/2019   Primary osteoarthritis of right hip 02/21/2020   Pulmonary HTN (HCC)    a.)  TTE 05/12/2019: PASP 71; b.) TTE 09/27/2020: PASP >70; c.) TTE 03/21/2021: RVSP 43; d.) TTE 04/24/2022: RVSP 80.6   Puncture wound of right hip 04/02/2018   PVD (peripheral vascular disease) with claudication (HCC)    a.) s/p PTA 03/02/2018 - balloon angioplasty LEFT below knee popliteal artery; b.) s/p PTA 09/30/2022: baloon angioplasty LEFT tibioperoneal trunck, most proximal peroneal artery, and LEFT popliteal artery.   Regional wall motion abnormality of heart 05/07/2019   Right groin pain 02/18/2020   S/P CABG x 4 2003   S/P TAVR (transcatheter aortic valve replacement) 01/20/2024   TAVR with a 29 mm Edwards Sapien 3 Ultra Resilia THV via the TF by Dr. Wendel and Dr. Shyrl   Screening for colon cancer 02/18/2018   Sepsis (HCC) 11/15/2023   Sleep apnea 07/26/2015   a.) does not require nocturnal PAP therapy   Subacute osteomyelitis of left foot (HCC) 03/25/2018   Thrombocytopenia (HCC)    Type 2 diabetes mellitus with hyperlipidemia (HCC) 07/26/2015   Type 2 diabetes mellitus with stage 4 chronic kidney disease, with long-term current use of insulin  (HCC) 07/26/2015   Type II diabetes mellitus with neurological manifestations (HCC) 02/18/2020   Vitamin B12 deficiency 06/16/2019   Vitamin D deficiency 05/07/2018    Has the patient had major surgery during 100 days prior to admission? Yes  Family History  family history includes Diabetes in his mother; Heart attack in his brother; Heart disease in his brother; Hyperlipidemia in his mother; Hypertension in his mother; Lung disease in his father.   Current Medications   Current Facility-Administered Medications:    (feeding supplement) PROSource Plus liquid 30 mL, 30 mL, Oral, BID BM, Ejigiri, Ogechi Grace, PA-C, 30 mL at 01/23/24 1335   0.9 %  sodium chloride  infusion, 250 mL, Intravenous, PRN, Sebastian Lamarr SAUNDERS, PA-C   acetaminophen  (TYLENOL ) tablet 650 mg, 650 mg, Oral, Q6H PRN, 650 mg at 01/19/24 0834 **OR** acetaminophen   (TYLENOL ) suppository 650 mg, 650 mg, Rectal, Q6H PRN, Schuh, McKenzi P, PA-C   albuterol  (PROVENTIL ) (2.5 MG/3ML) 0.083% nebulizer solution 2.5 mg, 2.5 mg, Nebulization, Q2H PRN, Schuh, McKenzi P, PA-C   aspirin  EC tablet 81 mg, 81 mg, Oral, Daily, Schuh, McKenzi P, PA-C, 81 mg at  01/23/24 0848   atorvastatin  (LIPITOR) tablet 20 mg, 20 mg, Oral, Daily, Schuh, McKenzi P, PA-C, 20 mg at 01/23/24 0848   carvedilol  (COREG ) tablet 3.125 mg, 3.125 mg, Oral, BID, Opyd, Timothy S, MD, 3.125 mg at 01/22/24 2152   Chlorhexidine  Gluconate Cloth 2 % PADS 6 each, 6 each, Topical, Q0600, Fritz Belvie DEL, NP, 6 each at 01/23/24 0626   [START ON 01/24/2024] Chlorhexidine  Gluconate Cloth 2 % PADS 6 each, 6 each, Topical, Q0600, Fritz Belvie DEL, NP   clopidogrel  (PLAVIX ) tablet 75 mg, 75 mg, Oral, Daily, Schuh, McKenzi P, PA-C, 75 mg at 01/23/24 0849   heparin  injection 5,000 Units, 5,000 Units, Subcutaneous, Q8H, Schuh, McKenzi P, PA-C, 5,000 Units at 01/23/24 9148   HYDROmorphone  (DILAUDID ) injection 0.5 mg, 0.5 mg, Intravenous, Q4H PRN, Schuh, McKenzi P, PA-C, 0.5 mg at 01/23/24 1533   insulin  aspart (novoLOG ) injection 0-6 Units, 0-6 Units, Subcutaneous, TID WC, Schuh, McKenzi P, PA-C, 1 Units at 01/22/24 1757   isosorbide  mononitrate (IMDUR ) 24 hr tablet 120 mg, 120 mg, Oral, Daily, Schuh, McKenzi P, PA-C, 120 mg at 01/22/24 1342   levothyroxine  (SYNTHROID ) tablet 50 mcg, 50 mcg, Oral, Daily, Schuh, McKenzi P, PA-C, 50 mcg at 01/23/24 9374   menthol -cetylpyridinium (CEPACOL) lozenge 3 mg, 1 lozenge, Oral, PRN, Fairy Frames, MD, 3 mg at 01/18/24 1035   nitroGLYCERIN  (NITROSTAT ) SL tablet 0.4 mg, 0.4 mg, Sublingual, Q5 min PRN, Schuh, McKenzi P, PA-C   norepinephrine  (LEVOPHED ) 4mg  in (0.016 mg/mL) premix infusion, 0-10 mcg/min, Intravenous, Titrated, Sebastian Collar R, PA-C   oxyCODONE  (Oxy IR/ROXICODONE ) immediate release tablet 5 mg, 5 mg, Oral, Q4H PRN, Schuh, McKenzi P, PA-C, 5 mg at  01/23/24 1245   polyethylene glycol (MIRALAX  / GLYCOLAX ) packet 17 g, 17 g, Oral, Daily PRN, Schuh, McKenzi P, PA-C, 17 g at 01/23/24 0850   sevelamer  carbonate (RENVELA ) tablet 800 mg, 800 mg, Oral, TID WC, Schuh, McKenzi P, PA-C, 800 mg at 01/23/24 1334   sodium chloride  flush (NS) 0.9 % injection 3 mL, 3 mL, Intravenous, Q12H, Sebastian Collar SAUNDERS, PA-C, 3 mL at 01/23/24 0900   sodium chloride  flush (NS) 0.9 % injection 3 mL, 3 mL, Intravenous, PRN, Sebastian Collar SAUNDERS, PA-C   tamsulosin  (FLOMAX ) capsule 0.4 mg, 0.4 mg, Oral, QPC breakfast, Schuh, McKenzi P, PA-C, 0.4 mg at 01/23/24 0848  Patients Current Diet:  Diet Order             Diet Heart Room service appropriate? Yes; Fluid consistency: Thin  Diet effective now                   Precautions / Restrictions Precautions Precautions: Fall Precaution/Restrictions Comments: R abdominal drain; L foot wound vac Other Brace: Darco wedge shoe LLE Restrictions Weight Bearing Restrictions Per Provider Order: No   Has the patient had 2 or more falls or a fall with injury in the past year?No  Prior Activity Level Community (5-7x/wk): Went to HD 3 times a week, wife usually drives him  Prior Functional Level Prior Function Prior Level of Function : Independent/Modified Independent Mobility Comments: Ambulates with AD. No falls in the past 21mo. ADLs Comments: Ind with ADLs, wife manages medications and assists with IADLs  Self Care: Did the patient need help bathing, dressing, using the toilet or eating?  Needed some help  Indoor Mobility: Did the patient need assistance with walking from room to room (with or without device)? Independent  Stairs: Did the patient need assistance with internal  or external stairs (with or without device)? Needed some help  Functional Cognition: Did the patient need help planning regular tasks such as shopping or remembering to take medications? Needed some help  Patient Information Are you of  Hispanic, Latino/a,or Spanish origin?: A. No, not of Hispanic, Latino/a, or Spanish origin What is your race?: B. Black or African American Do you need or want an interpreter to communicate with a doctor or health care staff?: 0. No Patient information obtained via proxy : no  Patient's Response To:  Health Literacy and Transportation Is the patient able to respond to health literacy and transportation needs?: Yes Health Literacy - How often do you need to have someone help you when you read instructions, pamphlets, or other written material from your doctor or pharmacy?: Always In the past 12 months, has lack of transportation kept you from medical appointments or from getting medications?: No In the past 12 months, has lack of transportation kept you from meetings, work, or from getting things needed for daily living?: No Higher education careers adviser obtained via proxy: no  Journalist, newspaper / Equipment Home Equipment: Agricultural consultant (2 wheels), Shower seat, BSC/3in1, Grab bars - toilet, Other (comment) (hurry cane)  Prior Device Use: Indicate devices/aids used by the patient prior to current illness, exacerbation or injury? Cane  Current Functional Level Cognition  Orientation Level: Oriented X4, Oriented to person, Oriented to place, Oriented to time, Oriented to situation    Extremity Assessment (includes Sensation/Coordination)  Upper Extremity Assessment: Generalized weakness  Lower Extremity Assessment: Defer to PT evaluation    ADLs  Overall ADL's : Needs assistance/impaired Eating/Feeding: Set up, Sitting Grooming: Set up, Sitting Upper Body Bathing: Set up, Sitting Lower Body Bathing: Moderate assistance, Sit to/from stand, Sitting/lateral leans Upper Body Dressing : Set up, Sitting Lower Body Dressing: Moderate assistance, Sit to/from stand Lower Body Dressing Details (indicate cue type and reason): Assist to thread legs and pull up in standing Toilet  Transfer: Moderate assistance, Rolling walker (2 wheels) Toilet Transfer Details (indicate cue type and reason): Simulated in room, mod assist to stand, tolerating only short step pivot Toileting- Clothing Manipulation and Hygiene: Minimal assistance, Sit to/from stand Toileting - Clothing Manipulation Details (indicate cue type and reason): Assist to steady in standing Functional mobility during ADLs: Minimal assistance, Rolling walker (2 wheels), Cueing for safety, Cueing for sequencing General ADL Comments: Pt limited by pain and fatigue    Mobility  Overal bed mobility: Needs Assistance Bed Mobility: Supine to Sit Supine to sit: HOB elevated, Used rails, Supervision General bed mobility comments: in recliner    Transfers  Overall transfer level: Needs assistance Equipment used: Rolling walker (2 wheels) Transfers: Sit to/from Stand Sit to Stand: Mod assist Bed to/from chair/wheelchair/BSC transfer type:: Step pivot Step pivot transfers: Min assist General transfer comment: Mod assist for boost and balance, requires extra time and cues for alignment/set up prior to standing. Posterior instability, cues to kick Lt foot out and emphasis light WB though heel only.    Ambulation / Gait / Stairs / Wheelchair Mobility  Ambulation/Gait Ambulation/Gait assistance: Editor, commissioning (Feet): 12 Feet Assistive device: Rolling walker (2 wheels) Gait Pattern/deviations: Step-to pattern, Decreased step length - left, Decreased stance time - left, Decreased weight shift to left, Decreased step length - right, Decreased stride length, Trunk flexed, Leaning posteriorly General Gait Details: Educated on sequencing with min assist for walker placement and intermittent assist for posterior instability. RW adjusted to help with efficency  of UEs to leverage with each step. Fatigued quickly.HR 95, SpO2 96% on RA. Gait velocity: decreased Gait velocity interpretation: <1.31 ft/sec, indicative of  household ambulator    Posture / Balance Dynamic Sitting Balance Sitting balance - Comments: Pt sat EOB with supervision Balance Overall balance assessment: Needs assistance Sitting-balance support: No upper extremity supported, Feet supported Sitting balance-Leahy Scale: Good Sitting balance - Comments: Pt sat EOB with supervision Standing balance support: Bilateral upper extremity supported, During functional activity, Reliant on assistive device for balance Standing balance-Leahy Scale: Poor Standing balance comment: Pt dependent on RW    Special considerations/ Life events Continuous Drip IV  n/a, Wound Vac n/a, Skin L BKA incision, Diabetic management yes, and Special service needs HD TTS     Previous Home Environment (from acute therapy documentation) Living Arrangements: Spouse/significant other Available Help at Discharge: Family, Available 24 hours/day Type of Home: House Home Layout: One level Home Access: Stairs to enter Entrance Stairs-Rails: Right, Left, Can reach both Entrance Stairs-Number of Steps: 2 Bathroom Shower/Tub: Psychologist, counselling, Engineer, manufacturing systems: Handicapped height Bathroom Accessibility: Yes How Accessible: Accessible via walker Home Care Services: No  Discharge Living Setting Plans for Discharge Living Setting: Patient's home, House, Lives with (comment) (Lives with wife and special needs grandson.) Type of Home at Discharge: House Discharge Home Layout: One level Discharge Home Access: Stairs to enter Entrance Stairs-Rails: Right, Left, Can reach both Entrance Stairs-Number of Steps: 2 Discharge Bathroom Shower/Tub: Walk-in shower, Curtain Discharge Bathroom Toilet: Handicapped height Discharge Bathroom Accessibility: Yes How Accessible: Accessible via walker Does the patient have any problems obtaining your medications?: No  Social/Family/Support Systems Patient Roles: Spouse, Other (Comment) Contact Information: Clennon Nasca -  wife - [phone] Anticipated Caregiver: wife Ability/Limitations of Caregiver: Wife is currently not working, cares for 69 yo special needs grandson Caregiver Availability: 24/7 Discharge Plan Discussed with Primary Caregiver: Yes Is Caregiver In Agreement with Plan?: Yes Does Caregiver/Family have Issues with Lodging/Transportation while Pt is in Rehab?: No   Goals Patient/Family Goal for Rehab: PT/OT mod I and supervision goals Expected length of stay: 7 days Pt/Family Agrees to Admission and willing to participate: Yes Program Orientation Provided & Reviewed with Pt/Caregiver Including Roles  & Responsibilities: Yes   Decrease burden of Care through IP rehab admission: N/A   Possible need for SNF placement upon discharge:  Not planned   Patient Condition: This patient's medical and functional status has changed since the consult dated: 01/21/24 in which the Rehabilitation Physician determined and documented that the patient's condition is appropriate for intensive rehabilitative care in an inpatient rehabilitation facility. See History of Present Illness (above) for medical update. Functional changes are: pt min assist for lateral scoot transfers. Patient's medical and functional status update has been discussed with the Rehabilitation physician and patient remains appropriate for inpatient rehabilitation. Will admit to inpatient rehab pending insurance auth ***.  Preadmission Screen Completed By:  Reche FORBES Lowers, PT, DPT 01/23/2024 3:51 PM ______________________________________________________________________   Discussed status with Dr. PIERRETTEon***at *** and received approval for admission today.  Admission Coordinator:  Caitlin E Warren, time***/Date***

## 2024-01-22 NOTE — Progress Notes (Signed)
 OT Cancellation Note  Patient Details Name: Phillip [name] MRN: [id number] DOB: [birth year]   Cancelled Treatment:    Reason Eval/Treat Not Completed: Patient at procedure or test/ unavailable. Pt off unit at HD. Will follow up as able to.   Maddie Brazier C, OT  Acute Rehabilitation Services Office [phone] Secure chat preferred   Adrianne GORMAN Savers 01/22/2024, 8:16 AM

## 2024-01-22 NOTE — Hospital Course (Addendum)
 67/M w ESRD on HD TTS, DM-1, recent acute cholecystitis June 2025) requiring cholecystostomy tube placement, chronic HFrEF, aortic stenosis (TAVR postponed due to left third toe gangrene), PAD-s/p PCI to left tibia-fibula/angioplasty on 7/25 who was admitted by vascular surgery 8/13 for an elective left third toe amputation. H/o significant issues with nonhealing ulceration of his left foot-progressing into a gangrenous left third toe, underwent a angiogram of LLE with transluminal angioplasty/stent placement to the left tibioperoneal trunk on 7/25 at [facility]. Plans were to proceed with a left third toe amputation post revascularization. However due to his significant underlying medical comorbidities, this could not be done at [facility], referred to the [facility] surgery and admitted to [facility].   8/13 - left 3rd toe amputation 8/15 - left TMA and wound vac placment 8/19 - TAVR 8/22 - wound vac removed   Assessment and Plan:   Left third toe gangrene/MTA foot necrosis - S/p transmetatarsal amputation with wound VAC placement 8/15.  Wound VAC removed 8/22.  Unfortunately TMA wound continues to be dusky and necrotic appearing.  Will likely warrant further amputation.  Vascular surgery following closely.  Decision about BKA versus AKA as well as timing, likely friday.   Severe aortic stenosis - S/p TAVR 8/19.  Cardiology following closely.  Zio patch initially placed for ambulatory monitoring however has since been discontinued.   Chronic HFrEF - Appears euvolemic at this time.  Volume removal per HD/UF.   ESRD - HD TTS.  Nephrology following closely.  LUE AVF.   CAD with CABG - Last cath 2/25 noting severe CAD with all vein grafts occluded.  Consider holding aspirin , Plavix  for impending surgery.   Recent history of acute cholecystitis requiring cholecystostomy drain-11/2023 - Percutaneous cholecystectomy drain in place.     Diabetes mellitus - A1c 5.8 suggesting excellent control.  Insulin  sliding  scale more.   Normocytic anemia - Likely anemia of chronic kidney disease.  Hemoglobin stable.   Hypothyroidism - Synthroid  on board.   Goals of care - Currently being evaluated for inpatient rehab/CIR.  Working closely with TOC on disposition planning.  However extremity appears more necrotic, likely to pursue further amputation so transition to CIR on hold.

## 2024-01-22 NOTE — Progress Notes (Signed)
 RN spoke with pt's wife, [name]. RN updated her on pt's status. RN reported that pt was having pain this morning, and he was given a dose of dilaudid . Pt also went to hemodialysis. [name] asked about potential discharge, and RN communicated that we were just waiting for CIR placement. It was also reported that pt was stable, and had minimal output from wound vac and percutaneous drain overnight.

## 2024-01-22 NOTE — Evaluation (Signed)
 Occupational Therapy Evaluation Patient Details Name: Phillip [name] MRN: [id number] DOB: 1955/08/02 Today's Date: 01/22/2024   History of Present Illness   Phillip [name] is a 68 y.o. male admitted 01/14/24 for left third toe gangrene. Pt s/p left third toe amputation including met head 8/13 and left TMA with wound vac application 8/15. Currently s/p TAVR on 8/19. PMHx: T1DM, PAD s/p angioplasty/PCI to left tibioperoneal trunk on 7/25, ESRD on HD TTS, HTN, chronic HFrEF, severe aortic stenosis, acute cholecystitis requiring cholecystostomy drain 6/25     Clinical Impressions Pt admitted based on above, and was seen based on problem list below. PTA pt was independent with ADLs. Today pt is requiring set up  to mod assist for ADLs. Functional transfers are up to mod assist with RW. Pt requiring step by step cueing for transfer. Pt limited by decreased strength, balance, and activity tolerance. Pt eager to return to PLOF and would greatly benefit from >3 hours of skilled rehab daily. OT will continue to follow acutely to maximize functional independence.        If plan is discharge home, recommend the following:   A lot of help with walking and/or transfers;A lot of help with bathing/dressing/bathroom     Functional Status Assessment   Patient has had a recent decline in their functional status and demonstrates the ability to make significant improvements in function in a reasonable and predictable amount of time.     Equipment Recommendations   Other (comment) (Defer to next venue)     Recommendations for Other Services   Rehab consult     Precautions/Restrictions   Precautions Precautions: Fall Recall of Precautions/Restrictions: Intact Precaution/Restrictions Comments: R abdominal drain; L foot wound vac Required Braces or Orthoses: Other Brace Other Brace: Darco wedge shoe LLE Restrictions Weight Bearing Restrictions Per Provider Order: No     Mobility Bed  Mobility     General bed mobility comments: Received in recliner    Transfers Overall transfer level: Needs assistance Equipment used: Rolling walker (2 wheels) Transfers: Sit to/from Stand, Bed to chair/wheelchair/BSC Sit to Stand: Mod assist     Step pivot transfers: Min assist     General transfer comment: Cues for hand placement and sequencing transfers      Balance Overall balance assessment: Needs assistance Sitting-balance support: No upper extremity supported, Feet supported Sitting balance-Leahy Scale: Good     Standing balance support: Bilateral upper extremity supported, During functional activity, Reliant on assistive device for balance Standing balance-Leahy Scale: Poor Standing balance comment: Pt dependent on RW         ADL either performed or assessed with clinical judgement   ADL Overall ADL's : Needs assistance/impaired Eating/Feeding: Set up;Sitting   Grooming: Set up;Sitting   Upper Body Bathing: Set up;Sitting   Lower Body Bathing: Moderate assistance;Sit to/from stand;Sitting/lateral leans   Upper Body Dressing : Set up;Sitting   Lower Body Dressing: Moderate assistance;Sit to/from stand Lower Body Dressing Details (indicate cue type and reason): Assist to thread legs and pull up in standing Toilet Transfer: Moderate assistance;Rolling walker (2 wheels) Toilet Transfer Details (indicate cue type and reason): Simulated in room, mod assist to stand, tolerating only short step pivot Toileting- Clothing Manipulation and Hygiene: Minimal assistance;Sit to/from stand Toileting - Clothing Manipulation Details (indicate cue type and reason): Assist to steady in standing     Functional mobility during ADLs: Minimal assistance;Rolling walker (2 wheels);Cueing for safety;Cueing for sequencing General ADL Comments: Pt limited by pain and fatigue  Vision Baseline Vision/History: 1 Wears glasses Vision Assessment?: No apparent visual deficits             Pertinent Vitals/Pain Pain Assessment Pain Assessment: Faces Faces Pain Scale: Hurts even more Pain Location: L foot Pain Descriptors / Indicators: Discomfort, Aching Pain Intervention(s): Limited activity within patient's tolerance, Patient requesting pain meds-RN notified     Extremity/Trunk Assessment Upper Extremity Assessment Upper Extremity Assessment: Generalized weakness   Lower Extremity Assessment Lower Extremity Assessment: Defer to PT evaluation   Cervical / Trunk Assessment Cervical / Trunk Assessment: Normal   Communication Communication Communication: No apparent difficulties   Cognition Arousal: Alert Behavior During Therapy: WFL for tasks assessed/performed Cognition: Cognition impaired     Awareness: Online awareness impaired Memory impairment (select all impairments): Non-declarative long-term memory, Armed forces training and education officer functioning impairment (select all impairments): Problem solving, Sequencing OT - Cognition Comments: Delayed processing speed     Following commands: Intact       Cueing  General Comments   Cueing Techniques: Verbal cues  VSS on RA           Home Living Family/patient expects to be discharged to:: Private residence Living Arrangements: Spouse/significant other Available Help at Discharge: Family;Available 24 hours/day Type of Home: House Home Access: Stairs to enter [employer] of Steps: 2 Entrance Stairs-Rails: Right;Left;Can reach both Home Layout: One level     Bathroom Shower/Tub: Walk-in shower;Tub/shower unit   Bathroom Toilet: Handicapped height Bathroom Accessibility: Yes How Accessible: Accessible via walker Home Equipment: Rolling Walker (2 wheels);Shower seat;BSC/3in1;Grab bars - toilet;Other (comment) (hurry cane)          Prior Functioning/Environment Prior Level of Function : Independent/Modified Independent       Mobility Comments: Ambulates with AD.  No falls in the past 31mo. ADLs Comments: Ind with ADLs, wife manages medications and assists with IADLs    OT Problem List: Decreased strength;Decreased activity tolerance;Impaired balance (sitting and/or standing);Decreased safety awareness;Decreased knowledge of use of DME or AE;Cardiopulmonary status limiting activity   OT Treatment/Interventions: Self-care/ADL training;Therapeutic exercise;Energy conservation;DME and/or AE instruction;Therapeutic activities;Patient/family education;Balance training      OT Goals(Current goals can be found in the care plan section)   Acute Rehab OT Goals Patient Stated Goal: To get better OT Goal Formulation: With patient Time For Goal Achievement: 02/05/24 Potential to Achieve Goals: Good   OT Frequency:  Min 2X/week       AM-PAC OT 6 Clicks Daily Activity     Outcome Measure Help from another person eating meals?: None Help from another person taking care of personal grooming?: A Little Help from another person toileting, which includes using toliet, bedpan, or urinal?: A Lot Help from another person bathing (including washing, rinsing, drying)?: A Lot Help from another person to put on and taking off regular upper body clothing?: A Little Help from another person to put on and taking off regular lower body clothing?: A Lot 6 Click Score: 16   End of Session Equipment Utilized During Treatment: Rolling walker (2 wheels);Gait belt Nurse Communication: Mobility status  Activity Tolerance: Patient limited by fatigue Patient left: in chair;with call bell/phone within reach;with chair alarm set  OT Visit Diagnosis: Unsteadiness on feet (R26.81);Other abnormalities of gait and mobility (R26.89);Muscle weakness (generalized) (M62.81)                Time: 8567-8545 OT Time Calculation (min): 22 min Charges:  OT General Charges $OT Visit: 1 Visit OT Evaluation $OT Eval Moderate Complexity: 1  Mod  Adrianne BROCKS, OT  Acute Rehabilitation  Services Office [phone] Secure chat preferred   Adrianne GORMAN Savers 01/22/2024, 3:10 PM

## 2024-01-22 NOTE — Progress Notes (Signed)
 Physical Therapy Treatment Patient Details Name: Phillip [name] MRN: [id number] DOB: 02/19/1956 Today's Date: 01/22/2024   History of Present Illness Phillip [name] is a 68 y.o. male admitted 01/14/24 for left third toe gangrene. Pt s/p left third toe amputation including met head 8/13 and left TMA with wound vac application 8/15. Currently s/p TAVR on 8/19. PMHx: T1DM, PAD s/p angioplasty/PCI to left tibioperoneal trunk on 7/25, ESRD on HD TTS, HTN, chronic HFrEF, severe aortic stenosis, acute cholecystitis requiring cholecystostomy drain 6/25    PT Comments  Fatigued after dialysis but willing to work with therapy as able. Reviewed techniques for transfer with up to mod assist for boost and balance, having posterior instability. Min assist for short distance gait in room up to 12 feet, very slowly. Educated on sequencing with assist for RW control/placement and balance due to intermittent posterior LOB. Educated awareness and safety, limiting WB lightly through Lt heel. Reviewed LE exercises. HR 95, SpO2 96% on RA. BP 99/63 in chair. Patient will continue to benefit from skilled physical therapy services to further improve independence with functional mobility.     If plan is discharge home, recommend the following: A lot of help with walking and/or transfers;Assistance with cooking/housework;Assist for transportation;Help with stairs or ramp for entrance;A lot of help with bathing/dressing/bathroom   Can travel by private vehicle     No  Equipment Recommendations  None recommended by PT    Recommendations for Other Services Rehab consult     Precautions / Restrictions Precautions Precautions: Fall Recall of Precautions/Restrictions: Intact Precaution/Restrictions Comments: R abdominal drain; L foot wound vac Required Braces or Orthoses: Other Brace Other Brace: Darco wedge shoe LLE Restrictions Weight Bearing Restrictions Per Provider Order: No     Mobility  Bed Mobility                General bed mobility comments: in recliner    Transfers Overall transfer level: Needs assistance Equipment used: Rolling walker (2 wheels) Transfers: Sit to/from Stand Sit to Stand: Mod assist           General transfer comment: Mod assist for boost and balance, requires extra time and cues for alignment/set up prior to standing. Posterior instability, cues to kick Lt foot out and emphasis light WB though heel only.    Ambulation/Gait Ambulation/Gait assistance: Min assist Gait Distance (Feet): 12 Feet Assistive device: Rolling walker (2 wheels) Gait Pattern/deviations: Step-to pattern, Decreased step length - left, Decreased stance time - left, Decreased weight shift to left, Decreased step length - right, Decreased stride length, Trunk flexed, Leaning posteriorly Gait velocity: decreased Gait velocity interpretation: <1.31 ft/sec, indicative of household ambulator   General Gait Details: Educated on sequencing with min assist for walker placement and intermittent assist for posterior instability. RW adjusted to help with efficency of UEs to leverage with each step. Fatigued quickly.HR 95, SpO2 96% on RA.   Stairs             Wheelchair Mobility     Tilt Bed    Modified Rankin (Stroke Patients Only)       Balance Overall balance assessment: Needs assistance Sitting-balance support: No upper extremity supported, Feet supported Sitting balance-Leahy Scale: Good     Standing balance support: Bilateral upper extremity supported, During functional activity, Reliant on assistive device for balance Standing balance-Leahy Scale: Poor Standing balance comment: Pt dependent on RW  Communication Communication Communication: No apparent difficulties  Cognition Arousal: Alert Behavior During Therapy: WFL for tasks assessed/performed   PT - Cognitive impairments: No apparent impairments                          Following commands: Intact      Cueing Cueing Techniques: Verbal cues  Exercises General Exercises - Lower Extremity Ankle Circles/Pumps: AROM, Both, 10 reps, Seated Quad Sets: Strengthening, Both, 10 reps, Seated Gluteal Sets: Strengthening, Both, 10 reps, Seated    General Comments General comments (skin integrity, edema, etc.): BP 99/63 seated in recliner with LEs dependent.      Pertinent Vitals/Pain Pain Assessment Pain Assessment: Faces Faces Pain Scale: Hurts little more Pain Location: L foot Pain Descriptors / Indicators: Aching, Operative site guarding Pain Intervention(s): Monitored during session, Premedicated before session, Repositioned, Limited activity within patient's tolerance    Home Living Family/patient expects to be discharged to:: Private residence Living Arrangements: Spouse/significant other Available Help at Discharge: Family;Available 24 hours/day Type of Home: House Home Access: Stairs to enter Entrance Stairs-Rails: Right;Left;Can reach both Entrance Stairs-Number of Steps: 2   Home Layout: One level Home Equipment: Agricultural consultant (2 wheels);Shower seat;BSC/3in1;Grab bars - toilet;Other (comment) (hurry cane)      Prior Function            PT Goals (current goals can now be found in the care plan section) Acute Rehab PT Goals Patient Stated Goal: Recover well to be able to return Home PT Goal Formulation: With patient Time For Goal Achievement: 02/04/24 Potential to Achieve Goals: Good Progress towards PT goals: Progressing toward goals    Frequency    Min 3X/week      PT Plan      Co-evaluation              AM-PAC PT 6 Clicks Mobility   Outcome Measure  Help needed turning from your back to your side while in a flat bed without using bedrails?: A Little Help needed moving from lying on your back to sitting on the side of a flat bed without using bedrails?: A Little Help needed moving to and from a bed to a  chair (including a wheelchair)?: A Lot Help needed standing up from a chair using your arms (e.g., wheelchair or bedside chair)?: A Lot Help needed to walk in hospital room?: A Lot Help needed climbing 3-5 steps with a railing? : Total 6 Click Score: 13    End of Session Equipment Utilized During Treatment: Gait belt Activity Tolerance: Patient tolerated treatment well;Patient limited by lethargy Patient left: in chair;with call bell/phone within reach;with chair alarm set Nurse Communication: Mobility status PT Visit Diagnosis: Muscle weakness (generalized) (M62.81);Difficulty in walking, not elsewhere classified (R26.2);Unsteadiness on feet (R26.81);Other abnormalities of gait and mobility (R26.89);Pain Pain - Right/Left: Left Pain - part of body: Ankle and joints of foot     Time: 1510-1533 PT Time Calculation (min) (ACUTE ONLY): 23 min  Charges:    $Gait Training: 8-22 mins $Therapeutic Activity: 8-22 mins PT General Charges $$ ACUTE PT VISIT: 1 Visit                     Leontine Roads, PT, DPT [facility]  Rehabilitation Services Physical Therapist Office: [phone] Website: [email or web address]    Leontine GORMAN Roads 01/22/2024, 3:53 PM

## 2024-01-22 NOTE — Progress Notes (Signed)
 Progress Note   Patient: GUNNARD DORRANCE FMW:[id number] DOB: 1956-05-23 DOA: 01/14/2024  DOS: the patient was seen and examined on 01/22/2024   Brief hospital course:  68/M w ESRD on HD TTS, DM-1, recent acute cholecystitis June 2025) requiring cholecystostomy tube placement, chronic HFrEF, aortic stenosis (TAVR postponed due to left third toe gangrene), PAD-s/p PCI to left tibia-fibula/angioplasty on 7/25 who was admitted by vascular surgery 8/13 for an elective left third toe amputation. H/o significant issues with nonhealing ulceration of his left foot-progressing into a gangrenous left third toe, underwent a angiogram of LLE with transluminal angioplasty/stent placement to the left tibioperoneal trunk on 7/25 at [facility]. Plans were to proceed with a left third toe amputation post revascularization. However due to his significant underlying medical comorbidities, this could not be done at [facility], referred to the [facility] surgery and admitted to [facility].  8/13 - left 3rd toe amputation 8/15 - left TMA and wound vac placment 8/19 - TAVR  Assessment and Plan:  Left third toe gangrene - S/p transmetatarsal amputation with wound VAC placement 8/15.  Wound care on board.  Severe aortic stenosis - S/p TAVR 8/19.  Cardiology following closely.  Cardiology planning to place Zio patch prior to DC given new LBBB.  Chronic HFrEF - Appears euvolemic at this time.  Volume removal per HD/UF.  ESRD - HD TTS.  Nephrology following closely.  LUE AVF.  CAD with CABG - Last cath 2/25 noting severe CAD with all vein grafts occluded.  Continue aspirin , Plavix , statin.  Recent history of acute cholecystitis requiring cholecystostomy drain-11/2023 - Percutaneous cholecystectomy drain in place.  Per outpatient general surgery note, no evident surgery were done post TAVR.  Diabetes mellitus - A1c 5.8 suggesting excellent control.  Insulin  sliding scale more.  Normocytic anemia - Likely anemia of chronic  kidney disease.  Hemoglobin stable.  Hypothyroidism - Synthroid  on board.  Goals of care - Currently being evaluated for inpatient rehab/CIR.  Working closely with TOC on disposition planning    Subjective: Patient resting comfortably this morning while in dialysis.  Denies any worsening shortness of breath, chest pain, nausea, vomiting, abdominal pain.  Extremity pain well-controlled.  Physical Exam:  Vitals:   01/22/24 0930 01/22/24 0945 01/22/24 1000 01/22/24 1030  BP: 110/65 (!) 106/54 109/64 105/66  Pulse: 76 73 72 76  Resp: 17 14 11 12   Temp:      TempSrc:      SpO2: 97% 97% 99% 100%  Weight:      Height:        GENERAL:  Alert, pleasant, no acute distress  HEENT:  EOMI CARDIOVASCULAR:  RRR, no murmurs appreciated RESPIRATORY:  Clear to auscultation, no wheezing, rales, or rhonchi GASTROINTESTINAL:  Soft, nontender, nondistended EXTREMITIES:  No LE edema bilaterally NEURO:  No new focal deficits appreciated SKIN: LLE TMA with wound VAC PSYCH:  Appropriate mood and affect     Data Reviewed:  Imaging Studies: ECHOCARDIOGRAM COMPLETE Result Date: 01/21/2024    ECHOCARDIOGRAM REPORT   Patient Name:   JAGER KOSKA Date of Exam: 01/21/2024 Medical Rec #:  [id number]        Height:       72.0 in Accession #:    [id number]       Weight:       165.6 lb Date of Birth:  March 29, 1956       BSA:          1.966 m Patient Age:    68 years  BP:           128/75 mmHg Patient Gender: M                HR:           77 bpm. Exam Location:  Inpatient Procedure: 2D Echo, Cardiac Doppler and Color Doppler (Both Spectral and Color            Flow Doppler were utilized during procedure). Indications:    Post TAVR evaluation. ; I35.0 Nonrheumatic aortic (valve)                 stenosis  History:        Patient has prior history of Echocardiogram examinations, most                 recent 01/20/2024. CHF, CAD, Prior CABG, Pulmonary HTN, Aortic                 Valve Disease,  Signs/Symptoms:Edema; Risk Factors:Hypertension                 and Dyslipidemia. ESRD.                 Aortic Valve: 29 mm Edwards Sapien prosthetic, stented (TAVR)                 valve is present in the aortic position. Procedure Date:                 01/20/2024.  Sonographer:    Ellouise Mose RDCS Referring Phys: [id number] [employer] R THOMPSON  Sonographer Comments: Technically difficult study due to poor echo windows. IMPRESSIONS  1. Left ventricular ejection fraction, by estimation, is 30 to 35%. The left ventricle has moderately decreased function. The left ventricle demonstrates regional wall motion abnormalities (see scoring diagram/findings for description). The left ventricular internal cavity size was moderately dilated. There is mild concentric left ventricular hypertrophy. Left ventricular diastolic parameters are consistent with Grade I diastolic dysfunction (impaired relaxation).  2. The mitral valve is degenerative. Mild to moderate mitral valve regurgitation. No evidence of mitral stenosis. Moderate mitral annular calcification.  3. The aortic valve has been repaired/replaced. There is a 29 mm Edwards Sapien prosthetic (TAVR) valve present in the aortic position. Procedure Date: 01/20/2024. Echo findings are consistent with trivial perivalvular leak of the aortic prosthesis.  4. Right ventricular systolic function is moderately reduced. The right ventricular size is normal.  5. Left atrial size was mildly dilated. FINDINGS  Left Ventricle: Left ventricular ejection fraction, by estimation, is 30 to 35%. The left ventricle has moderately decreased function. The left ventricle demonstrates regional wall motion abnormalities. The left ventricular internal cavity size was moderately dilated. There is mild concentric left ventricular hypertrophy. Left ventricular diastolic parameters are consistent with Grade I diastolic dysfunction (impaired relaxation).  LV Wall Scoring: The inferior wall and posterior wall are  akinetic. Right Ventricle: The right ventricular size is normal. No increase in right ventricular wall thickness. Right ventricular systolic function is moderately reduced. Left Atrium: Left atrial size was mildly dilated. Right Atrium: Right atrial size was normal in size. Pericardium: There is no evidence of pericardial effusion. Mitral Valve: The mitral valve is degenerative in appearance. Moderate mitral annular calcification. Mild to moderate mitral valve regurgitation. No evidence of mitral valve stenosis. MV peak gradient, 7.8 mmHg. The mean mitral valve gradient is 3.0 mmHg. Tricuspid Valve: The tricuspid valve is normal in structure. Tricuspid valve regurgitation is trivial. No evidence of tricuspid  stenosis. Aortic Valve: Trivial perivalvular leak and 2 o'clock. The aortic valve has been repaired/replaced. Aortic valve regurgitation is not visualized. No aortic stenosis is present. Aortic valve mean gradient measures 5.0 mmHg. Aortic valve peak gradient measures 10.2 mmHg. Aortic valve area, by VTI measures 4.55 cm. There is a 29 mm Edwards Sapien prosthetic, stented (TAVR) valve present in the aortic position. Procedure Date: 01/20/2024. Echo findings are consistent with normal structure and function of the aortic valve prosthesis. Pulmonic Valve: The pulmonic valve was normal in structure. Pulmonic valve regurgitation is not visualized. No evidence of pulmonic stenosis. Aorta: The aortic root and ascending aorta are structurally normal, with no evidence of dilitation. IAS/Shunts: There is redundancy of the interatrial septum. No atrial level shunt detected by color flow Doppler.  LEFT VENTRICLE PLAX 2D LVIDd:         5.18 cm      Diastology LVIDs:         4.80 cm      LV e' medial:    3.92 cm/s LV PW:         1.28 cm      LV E/e' medial:  22.8 LV IVS:        1.39 cm      LV e' lateral:   5.55 cm/s LVOT diam:     2.90 cm      LV E/e' lateral: 16.1 LV SV:         124 LV SV Index:   63 LVOT Area:     6.61  cm  LV Volumes (MOD) LV vol d, MOD A2C: 277.0 ml LV vol d, MOD A4C: 198.0 ml LV vol s, MOD A2C: 206.0 ml LV vol s, MOD A4C: 138.0 ml LV SV MOD A2C:     71.0 ml LV SV MOD A4C:     198.0 ml LV SV MOD BP:      71.8 ml RIGHT VENTRICLE            IVC RV S prime:     7.40 cm/s  IVC diam: 1.70 cm TAPSE (M-mode): 0.7 cm LEFT ATRIUM             Index        RIGHT ATRIUM           Index LA diam:        3.96 cm 2.01 cm/m   RA Area:     16.20 cm LA Vol (A2C):   38.2 ml 19.43 ml/m  RA Volume:   46.30 ml  23.55 ml/m LA Vol (A4C):   28.2 ml 14.34 ml/m LA Biplane Vol: 32.4 ml 16.48 ml/m  AORTIC VALVE AV Area (Vmax):    5.08 cm AV Area (Vmean):   4.73 cm AV Area (VTI):     4.55 cm AV Vmax:           160.00 cm/s AV Vmean:          100.467 cm/s AV VTI:            0.273 m AV Peak Grad:      10.2 mmHg AV Mean Grad:      5.0 mmHg LVOT Vmax:         123.00 cm/s LVOT Vmean:        71.900 cm/s LVOT VTI:          0.188 m LVOT/AV VTI ratio: 0.69  AORTA Ao Root diam: 3.51 cm Ao Asc diam:  3.59 cm MITRAL VALVE MV  Area (PHT): 4.15 cm     SHUNTS MV Area VTI:   3.42 cm     Systemic VTI:  0.19 m MV Peak grad:  7.8 mmHg     Systemic Diam: 2.90 cm MV Mean grad:  3.0 mmHg MV Vmax:       1.40 m/s MV Vmean:      88.5 cm/s MV Decel Time: 183 msec MV E velocity: 89.40 cm/s MV A velocity: 127.00 cm/s MV E/A ratio:  0.70 Toribio Fuel MD Electronically signed by Toribio Fuel MD Signature Date/Time: 01/21/2024/12:14:45 PM    Final    ECHOCARDIOGRAM LIMITED Result Date: 01/20/2024    ECHOCARDIOGRAM LIMITED REPORT   Patient Name:   WIN GUAJARDO Date of Exam: 01/20/2024 Medical Rec #:  [id number]        Height:       72.0 in Accession #:    [id number]       Weight:       165.8 lb Date of Birth:  January 22, 1956       BSA:          1.967 m Patient Age:    67 years         BP:           105/60 mmHg Patient Gender: M                HR:           61 bpm. Exam Location:  Inpatient Procedure: Limited Color Doppler (Both Spectral and Color Flow  Doppler were            utilized during procedure). Indications:     aortic stenosis. TAVR  History:         Patient has prior history of Echocardiogram examinations, most                  recent 08/01/2023. CAD, Prior CABG, end stage renal disease;                  Risk Factors:Hypertension, Dyslipidemia, Diabetes and Sleep                  Apnea.                  Aortic Valve: 29 mm valve is present in the aortic position.                  Procedure Date: 01/20/2024.  Sonographer:     Tinnie Barefoot RDCS Referring Phys:  [id number] [facility] THOMPSON Diagnosing Phys: Darryle Decent MD IMPRESSIONS  1. Echo guided TAVR. Vmax 1.21 m/s, MG 3.0 mmHG, EOA 4.57 cm2. No regurgitation or paravalvular leak. Aortic valve regurgitation is not visualized. There is a 29 mm valve present in the aortic position. Procedure Date: 01/20/2024. Aortic valve area, by VTI measures 4.57 cm. Aortic valve mean gradient measures 3.0 mmHg. Aortic valve Vmax measures 1.21 m/s.  2. Left ventricular ejection fraction, by estimation, is 30 to 35%. The left ventricle has moderately decreased function.  3. Right ventricular systolic function is normal. The right ventricular size is normal.  4. The mitral valve is degenerative. Mild mitral valve regurgitation. No evidence of mitral stenosis. FINDINGS  Left Ventricle: Left ventricular ejection fraction, by estimation, is 30 to 35%. The left ventricle has moderately decreased function.  LV Wall Scoring: The inferior wall and posterior wall are akinetic. Right Ventricle: The right ventricular size is normal. No increase in right ventricular  wall thickness. Right ventricular systolic function is normal. Pericardium: There is no evidence of pericardial effusion. Mitral Valve: The mitral valve is degenerative in appearance. Mild mitral valve regurgitation. No evidence of mitral valve stenosis. Tricuspid Valve: The tricuspid valve is grossly normal. Tricuspid valve regurgitation is mild . No evidence of  tricuspid stenosis. Aortic Valve: Echo guided TAVR. Vmax 1.21 m/s, MG 3.0 mmHG, EOA 4.57 cm2. No regurgitation or paravalvular leak. Aortic valve regurgitation is not visualized. Aortic regurgitation PHT measures 446 msec. Aortic valve mean gradient measures 3.0 mmHg. Aortic valve peak gradient measures 5.9 mmHg. Aortic valve area, by VTI measures 4.57 cm. There is a 29 mm valve present in the aortic position. Procedure Date: 01/20/2024. Aorta: The aortic root and ascending aorta are structurally normal, with no evidence of dilitation. Additional Comments: Spectral Doppler performed. Color Doppler performed.  LEFT VENTRICLE PLAX 2D LVIDd:         5.30 cm LV PW:         1.00 cm LV IVS:        1.20 cm LVOT diam:     2.68 cm LV SV:         105 LV SV Index:   54 LVOT Area:     5.64 cm  LV Volumes (MOD) LV vol d, MOD A4C: 181.0 ml LV vol s, MOD A4C: 117.0 ml LV SV MOD A4C:     181.0 ml LEFT ATRIUM         Index LA diam:    3.70 cm 1.88 cm/m  AORTIC VALVE AV Area (Vmax):    4.90 cm AV Area (Vmean):   4.80 cm AV Area (VTI):     4.57 cm AV Vmax:           121.00 cm/s AV Vmean:          80.400 cm/s AV VTI:            0.231 m AV Peak Grad:      5.9 mmHg AV Mean Grad:      3.0 mmHg LVOT Vmax:         105.00 cm/s LVOT Vmean:        68.400 cm/s LVOT VTI:          0.187 m LVOT/AV VTI ratio: 0.81 AI PHT:            446 msec  AORTA Ao Root diam: 3.40 cm Ao Asc diam:  3.30 cm  SHUNTS Systemic VTI:  0.19 m Systemic Diam: 2.68 cm Darryle Decent MD Electronically signed by Darryle Decent MD Signature Date/Time: 01/20/2024/6:03:09 PM    Final    Structural Heart Procedure Result Date: 01/20/2024 See surgical note for result.  IR Radiologist Eval & Mgmt Result Date: 01/01/2024 : Chief Complaint:Patient was seen in consultation today for acute cholecystitis s/p percutaneous cholecystostomy drain placement,Referring Physician(s):Dr. Dreama Hanger, MD Supervising Physician: Johann, DanielHistory of Present Illness:Mayford D Borghi  is a 68 y.o. male with past medical history of ESRD on HD, severe aortic stenosis planning on TAVR soon, HFrEF, NSTEMI 2022, CAD s/p CABG in 2003, GERD, DM2, HTN, thrombocytopenia, and prior lap appendectomy in 2020 who presented to [facility] ED with acute abdominal pain after he was found to be febrile at his dialysis center 6/10. Initially his RUQ US  was nonspecific for acute cholecystitis despite RUQ pain, tachycardia, fever, and elevated lactic acid. However, subsequent MR Abdomen/MRCP showed a distended gallbladder with innumerable stones, without biliary duct dilation. Due to notable cardiac and  ESRD comorbidities, patient was recommended for percutaneous cholecystostomy drain placement, which was placed in IR on 6/11. He improved subsequently, and eventually discharged with multiple follow-up appointments, including with outpatient IR clinic. His planned TAVR pprocedure was postponed in light of his acute cholecystitis.Patient has been seen by cardiology and vascular surgery. His TAVR remains pending. He was informed by his cardiology team that his potential cholecystectomy will have to be deferred pending cardiac evaluation, TAVR procedure, and eventual clearance.Patient presents to IR clinic today for 7 week follow-up. He endorses expected discomfort from his drain, but other is overall well. He is tired since his dialysis session yesterday. He has a necrotic toe, being evaluated, that causes him pain. His wife is at his side. His drain has good bilious output daily. His wife flushes it daily. He denies fevers, chills, abdominal pain, nausea, vomiting. His surgical follow-up appointment is scheduled for 8/11 with Dr. Dasie. Past Medical History: Diagnosis * : Date . * : Abdominopelvic abscess (HCC) * : 09/29/2018 . * : Abscess of appendix * : 09/22/2018 . * : Acquired spondylolisthesis of lumbosacral region * : 03/20/2020 . * : Acute cholecystitis * : 11/15/2023 . * : Acute kidney injury (HCC) * : 10/07/2018 . * :  Acute kidney injury superimposed on chronic kidney disease (HCC) * : 11/06/2020 . * : Acute kidney injury superimposed on CKD (HCC) * : 02/18/2020 . * : Acute on chronic systolic (congestive) heart failure (HCC) * : 07/26/2015 * : Formatting of this note might be different from the original. Formatting of this note might be different from the original. Formatting of this note might be different from the original. revenkar EF 35% Formatting of this note might be different from the original. revenkar EF 35% . * : Acute on chronic systolic CHF (congestive heart failure) (HCC) * : 08/12/2023 . * : Anasarca * : 07/01/2023 . * : Anemia of chronic disease * : 08/18/2019 . * : Anemia, chronic disease * : 08/18/2019 . * : Aortic atherosclerosis (HCC) * : . * : Aortic stenosis * : * : a.) TTE 09/15/2018: mild-mod (MPG 19); b.) TTE 03/21/2021: mild-mod (MPG 14.3); c.) TTE 04/24/2022: mild- mod (MPG 15) . * : Aortic stenosis, moderate * : 10/15/2019 . * : Appendicitis * : 09/08/2018 . * : ARF (acute renal failure) (HCC) * : 02/19/2020 . * : Asthma * : . * : Atherosclerosis of native arteries of the extremities with ulceration (HCC) * : 02/24/2018 * : Formatting of this note might be different from the original. Last Assessment & Plan: His ABIs today are 1.07 on the right and 0.99 on the left with biphasic waveforms. Although these pressures may be somewhat elevated from medial calcification, his flow does appear to be reasonably good. His left ABI was 0.58 prior to intervention. At this point, we will stretch out his follow-up and see hi . * : Benign hypertension with CKD (chronic kidney disease) stage IV (HCC) * : 02/24/2018 . * : Benign prostatic hyperplasia without lower urinary tract symptoms * : 01/14/2018 . * : Bilateral lower extremity edema * : 11/04/2018 . * : Bradycardia * : 03/25/2018 . * : Bruit of right carotid artery * : 07/26/2015 . * : CAD (coronary artery disease) * : 2003 * : a.) s/p 4v CABG 2003 . * : Cardiac  murmur * : 07/26/2015 . * : Choledocholithiasis * : 11/12/2023 . * : Chronic combined systolic and diastolic CHF (congestive heart  failure) (HCC) * : 07/26/2015 * : a.) TTE 09/15/2018: EF 50-55%, mod LVH, RVE, BAE, mild-mod TR, AoV sclerosis, G1DD; b.) MPI 10/27/2019: EF <30%; c.) TTE 03/21/2021: EF 35-40%, post AK, inf HK, mod LVH, mod red RVSF, mod LAE, mod Aov sclerosis, G2DD; c.) TTE 04/24/2022: EF 35-40%, post AK, glok HK, mod LVE, mod red RVSF, mild-mod MR, Aov sclerosis, G3DD . * : Chronic idiopathic constipation * : 11/02/2018 . * : Chronic low back pain without sciatica * : 12/02/2018 . * : Chronic pain of right hip * : 03/31/2020 . * : Chronic systolic heart failure (HCC) * : 07/26/2015 * : Formatting of this note might be different from the original. revenkar EF 35% . * : CKD (chronic kidney disease) stage 4, GFR 15-29 ml/min (HCC) * : 10/07/2018 . * : Congestive heart failure (HCC) * : 07/26/2015 * : Last Assessment & Plan: Poor cardiac function could certainly be contributing to poor blood supply to the feet and toes as well. Formatting of this note might be different from the original. Formatting of this note might be different from the original. Last Assessment & Plan: Poor cardiac function could certainly be contributing to poor blood supply to the feet and toes as well. Last . * : Coronary artery disease * : . * : Diabetes mellitus without complication (HCC) * : . * : Dysphagia * : 07/26/2015 . * : Encounter for long-term (current) use of insulin  (HCC) * : 09/27/2020 . * : Erectile dysfunction * : 07/14/2017 . * : ESRD on dialysis (HCC) * : 11/15/2023 . * : Essential hypertension * : 02/24/2018 . * : Fever * : 11/07/2020 . * : Foot ulcer (HCC) * : 09/27/2020 * : left foot Formatting of this note might be different from the original. Formatting of this note might be different from the original. left foot . * : Foot ulcer, left (HCC) * : . * : GERD (gastroesophageal reflux disease) * : . * : Heart  murmur * : 07/26/2015 . * : Hematuria * : 07/27/2015 . * : High risk medication use * : 07/26/2015 . * : History of marijuana use * : . * : Hyperkalemia * : 05/29/2020 . * : Hyperlipidemia * : 07/26/2015 . * : Hyperlipidemia associated with type 2 diabetes mellitus (HCC) * : 02/18/2020 . * : Hypertension * : . * : Hypertension associated with diabetes (HCC) * : . * : Hypoglycemia * : 05/07/2019 . * : Hypothyroidism * : 11/14/2015 . * : Hypothyroidism (acquired) * : 11/14/2015 . * : Insomnia * : 11/02/2018 . * : Kidney insufficiency * : . * : Lumbar spondylosis * : 03/20/2020 . * : Malaise and fatigue * : 03/25/2018 . * : Malnutrition of moderate degree * : 08/20/2023 . * : Mitral regurgitation * : 05/12/2019 . * : Moderate aortic regurgitation * : 05/12/2019 . * : Myocardial infarction due to demand ischemia [facility]) * : 09/26/2020 * : a.) Type II NSTEMI; b.) troponins were trended 0.54 --> 0.56 --> 0.52 ng/mL . * : Non-compliance * : 05/05/2019 . * : NSTEMI (non-ST elevated myocardial infarction) (HCC) * : 09/27/2020 . * : Osseous and subluxation stenosis of intervertebral foramina of lumbar region * : 03/20/2020 . * : Osteoarthritis * : 10/06/2019 . * : Pancytopenia (HCC) * : 08/12/2023 . * : Peripheral vascular disease (HCC) * : . * : Personal history of tobacco use, presenting hazards to health * :  09/27/2020 . * : Polyneuropathy in diabetes (HCC) * : 05/07/2019 . * : Primary osteoarthritis involving multiple joints * : 10/06/2019 . * : Primary osteoarthritis of right hip * : 02/21/2020 . * : Pulmonary HTN (HCC) * : * : a.) TTE 05/12/2019: PASP 71; b.) TTE 09/27/2020: PASP >70; c.) TTE 03/21/2021: RVSP 43; d.) TTE 04/24/2022: RVSP 80.6 . * : Puncture wound of right hip * : 04/02/2018 . * : PVD (peripheral vascular disease) with claudication (HCC) * : * : a.) s/p PTA 03/02/2018 - balloon angioplasty LEFT below knee popliteal artery; b.) s/p PTA 09/30/2022: baloon angioplasty LEFT tibioperoneal trunck, most proximal  peroneal artery, and LEFT popliteal artery. . * : Regional wall motion abnormality of heart * : 05/07/2019 . * : Right groin pain * : 02/18/2020 . * : S/P CABG x 4 * : 2003 . * : Screening for colon cancer * : 02/18/2018 . * : Sepsis (HCC) * : 11/15/2023 . * : Sleep apnea * : 07/26/2015 * : a.) does not require nocturnal PAP therapy . * : Subacute osteomyelitis of left foot (HCC) * : 03/25/2018 . * : Thrombocytopenia (HCC) * : . * : Type 2 diabetes mellitus with hyperlipidemia (HCC) * : 07/26/2015 . * : Type 2 diabetes mellitus with stage 4 chronic kidney disease, with long-term current use of insulin  (HCC) * : 07/26/2015 . * : Type II diabetes mellitus with neurological manifestations (HCC) * : 02/18/2020 . * : Vitamin B12 deficiency * : 06/16/2019 . * : Vitamin D deficiency * : 05/07/2018 Past Surgical History: Procedure * : Laterality * : Date . * : AMPUTATION TOE * : Left * : 03/13/2018 * : Procedure: AMPUTATION TOE-MPJ; Surgeon: Ashley Soulier, DPM; Location: [facility] ORS; Service: Podiatry; Laterality: Left; . * : ANGIOPLASTY * : * : . * : AV FISTULA PLACEMENT * : Left * : 08/20/2023 * : Procedure: ARTERIOVENOUS (AV) FISTULA CREATION; Surgeon: Gretta Lonni PARAS, MD; Location: [facility] OR; Service: Vascular; Laterality: Left; . * : CARDIAC CATHETERIZATION * : * : . * : CORONARY ARTERY BYPASS GRAFT * : N/A * : 2003 . * : IR FLUORO GUIDE CV LINE RIGHT * : * : 08/18/2023 . * : IR PERC CHOLECYSTOSTOMY * : * : 11/12/2023 . * : IR US  GUIDE VASC ACCESS RIGHT * : * : 08/18/2023 . * : LAPAROSCOPIC APPENDECTOMY * : N/A * : 09/08/2018 * : Procedure: APPENDECTOMY LAPAROSCOPIC; Surgeon: Jordis Laneta FALCON, MD; Location: [facility] ORS; Service: General; Laterality: N/A; . * : LOWER EXTREMITY ANGIOGRAPHY * : Left * : 03/02/2018 * : Procedure: LOWER EXTREMITY ANGIOGRAPHY; Surgeon: Marea Selinda RAMAN, MD; Location: [facility]; Service: Cardiovascular; Laterality: Left; . * : LOWER EXTREMITY ANGIOGRAPHY * : Left * : 09/30/2022 * : Procedure:  Lower Extremity Angiography; Surgeon: Marea Selinda RAMAN, MD; Location: [facility]; Service: Cardiovascular; Laterality: Left; . * : LOWER EXTREMITY ANGIOGRAPHY * : Left * : 12/26/2023 * : Procedure: Lower Extremity Angiography; Surgeon: Marea Selinda RAMAN, MD; Location: [facility] IN[facility]; Service: Cardiovascular; Laterality: Left; . * : RIGHT/LEFT HEART CATH AND CORONARY/GRAFT ANGIOGRAPHY * : N/A * : 08/01/2023 * : Procedure: RIGHT/LEFT HEART CATH AND CORONARY/GRAFT ANGIOGRAPHY; Surgeon: Wendel Lurena POUR, MD; Location: [facility]; Service: Cardiovascular; Laterality: N/A; . * : ROTATOR CUFF REPAIR * : Left * : Allergies:Patient has no known allergies.Medications: Prior to Admission medications Medication * : Sig * : Start Date * :  End Date * : Taking? * : Authorizing Provider acetaminophen  (TYLENOL ) 500 MG tablet * : Take 1,000 mg by mouth every 6 (six) hours as needed for moderate pain or headache. * : * : * : * : [provider] aspirin  EC 81 MG tablet * : Take 1 tablet (81 mg total) by mouth daily. Swallow whole. * : 07/25/23 * : * : * : Thukkani, Arun K, MD atorvastatin  (LIPITOR) 20 MG tablet * : Take 1 tablet (20 mg total) by mouth daily. * : 07/25/23 * : * : * : Thukkani, Arun K, MD carvedilol  (COREG ) 3.125 MG tablet * : Take 1 tablet (3.125 mg total) by mouth 2 (two) times daily. * : 05/20/23 * : 05/02/24 * : * : Carlin Delon BROCKS, NP clopidogrel  (PLAVIX ) 75 MG tablet * : Take 1 tablet (75 mg total) by mouth daily. * : 12/26/23 * : * : * : Dew, Selinda RAMAN, MD gabapentin  (NEURONTIN ) 300 MG capsule * : Take 1 capsule (300 mg total) by mouth at bedtime.Patient taking differently: Take 300 mg by mouth 2 (two) times daily. * : 08/25/23 * : * : * : Arrien, Elidia Sieving, MD insulin  glargine (LANTUS ) 100 UNIT/ML injection * : Inject 7 Units into the skin daily as needed (Diabetes). Inject 7 units at bedtime as needed after checking glucose * : * : * : * : [provider] isosorbide  mononitrate  (IMDUR ) 120 MG 24 hr tablet * : Take 1 tablet (120 mg total) by mouth daily. * : 08/27/23 * : * : * : Revankar, Jennifer SAUNDERS, MD latanoprost  (XALATAN ) 0.005 % ophthalmic solution * : Place 1 drop into both eyes at bedtime. * : 04/15/23 * : * : * : [provider] levothyroxine  (SYNTHROID ) 50 MCG tablet * : Take 50 mcg by mouth daily. * : 04/05/23 * : * : * : [provider] nitroGLYCERIN  (NITROSTAT ) 0.4 MG SL tablet * : Place 1 tablet (0.4 mg total) under the tongue every 5 (five) minutes as needed for chest pain. * : 11/20/20 * : * : * : Revankar, Jennifer SAUNDERS, MD sodium chloride  flush (NS) 0.9 % SOLN * : Inject 5 mLs into the vein daily for 15 days. flush drain daily with 5 cc NS, record output daily, dressing changes every 2-3 days or earlier if soiled * : 11/14/23 * : 12/15/23 * : * : Darci Pore, MD tamsulosin  (FLOMAX ) 0.4 MG CAPS capsule * : Take 0.4 mg by mouth daily after breakfast. * : 04/19/20 * : * : * : [provider] vitamin B-12 (CYANOCOBALAMIN ) 500 MCG tablet * : Take 500 mcg by mouth daily. * : * : * : * : [provider] Vitamin D, Ergocalciferol, (DRISDOL) 1.25 MG (50000 UNIT) CAPS capsule * : Take 50,000 Units by mouth once a week. Mondays * : 07/13/21 * : * : * : [provider] Family History Problem * : Relation * : Age of Onset . * : Hyperlipidemia * : Mother * : . * : Hypertension * : Mother * : . * : Diabetes * : Mother * : . * : Heart attack * : Brother * : . * : Heart disease * : Brother * : . * : Lung disease * : Father * : Socioeconomic History . * : Marital status: * : Married * : * : Spouse name: * : Not on file . * : Number  of children: * : Not on file . * : Years of education: * : Not on file . * : Highest education level: * : Not on file Occupational History . * : Not on file Tobacco Use . * : Smoking status: * : Former * : * : Current packs/day: * : 0.00 * : * : Average packs/day: * : 0.5 packs/day for 25.0 years (12.5 ttl pk-yrs) * : * :  Types: * : Cigarettes * : * : Start date: * : 7 * : * : Quit date: * : 2003 * : * : Years since quitting: * : 22.5 . * : Smokeless tobacco: * : Never Vaping Use . * : Vaping status: * : Never Used Substance and Sexual Activity . * : Alcohol use: * : Not Currently * : * : Comment: 2003 . * : Drug use: * : Never . * : Sexual activity: * : Not on file Other Topics * : Concern . * : Not on file Social History Narrative . * : Not on file Financial Resource Strain: Low Risk  (09/26/2020) * : Received from [facility] * : Overall Financial Resource Strain (CARDIA) * : . * : Difficulty of Paying Living Expenses: Not hard at all Food Insecurity: No Food Insecurity (11/12/2023) * : Hunger Vital Sign * : . * : Worried About Running Out of Food in the Last Year: Never true * : . * : Ran Out of Food in the Last Year: Never true Transportation Needs: No Transportation Needs (11/12/2023) * : PRAPARE - Transportation * : . * : Lack of Transportation (Medical): No * : . * : Lack of Transportation (Non-Medical): No Physical Activity: Inactive (09/26/2020) * : Received from [facility] * : Exercise Vital Sign * : . * : On average, how many days per week do you engage in moderate to strenuous exercise (like a brisk walk)?: 0 days * : . * : On average, how many minutes do you engage in exercise at this level?: 0 min Stress: No Stress Concern Present (09/26/2020) * : Received from [facility] * : [facility] - Occupational Stress Questionnaire * : . * : Feeling of Stress : Not at all Social Connections: Moderately Integrated (11/12/2023) * : Social Connection and Isolation Panel * : . * : Frequency of Communication with Friends and Family: Three times a week * : . * : Frequency of Social Gatherings with Friends and Family: Once a week * : . * : Attends Religious Services: 1 to 4 times per year * : . * : Active Member of Clubs or Organizations: No * : . * : Attends Club or Organization Meetings: Never  * : . * : Marital Status: Married Review of Systems: A 12 point ROS discussed and pertinent positives are indicated in the HPI above. All other systems are negative.Physical ExamConstitutional: Appearance: Normal appearance. Abdominal: General: Abdomen is flat. Palpations: Abdomen is soft. Comments: RUQ drain appropriately dressed. Dressing is clean, dry, intact.Drain incision site non-tender, without evidence of infection. Retaining suture and Stat Lock in place. Good bilious output in collection bag. Neurological: Mental Status: He is alert. Imaging: PERIPHERAL VASCULAR CATHETERIZATIONResult Date: 7/25/2025See surgical note for result. VAS US  ABI WITH/WO TBIResult Date: 12/23/2023 LOWER EXTREMITY DOPPLER STUDY Patient Name: MACKSON BOTZ Date of Exam: 12/22/2023 Medical Rec #: [id number] Accession #: [id number] Date of Birth: 13-Apr-1956 Patient Gender: M Patient Age: 63  years Exam Location: [facility] Procedure: VAS US  ABI WITH/WO TBI Referring Phys: -------------------------------------------------------------------------------- Indications: Peripheral artery disease. Vascular Interventions: 09/30/2022 Percutaneous transluminal angioplasty of left tibioperoneal trunk and most proximal peroneal artery with 4 mm diameter by 6 cm length Lutonix drug-coated angioplasty balloon 5. Percutaneous transluminal angioplasty of left popliteal artery with 6 mm diameter by 8 cm length Lutonix drug-coated angioplasty balloon. Comparison Study: 09/2022 Performing Technologist: Jerel Croak RVT Examination Guidelines: A complete evaluation includes at minimum, Doppler waveform signals and systolic blood pressure reading at the level of bilateral brachial, anterior tibial, and posterior tibial arteries, when vessel segments are accessible. Bilateral testing is considered an integral part of a complete examination. Photoelectric Plethysmograph (PPG) waveforms and toe systolic pressure readings are included as required  and additional duplex testing as needed. Limited examinations for reoccurring indications may be performed as noted. ABI Findings: +---------+------------------+-----+----------+--------+ Right Rt Pressure (mmHg)IndexWaveform Comment  +---------+------------------+-----+----------+--------+ Brachial 118     +---------+------------------+-----+----------+--------+ ATA   monophasicNonComp  +---------+------------------+-----+----------+--------+ PTA   monophasicNonComp  +---------+------------------+-----+----------+--------+ Great Toe50 0.42 Abnormal   +---------+------------------+-----+----------+--------+ +---------+------------------+-----+--------+-------+ Left Lt Pressure (mmHg)IndexWaveformComment +---------+------------------+-----+--------+-------+ Brachial 120     +---------+------------------+-----+--------+-------+ ATA   biphasicNonComp +---------+------------------+-----+--------+-------+ PTA   biphasicNonComp +---------+------------------+-----+--------+-------+ Great Toe82 0.68 Abnormal  +---------+------------------+-----+--------+-------+ +-------+-----------+-------------+------------+------------+ ABI/TBIToday's ABIToday's TBI Previous ABIPrevious TBI +-------+-----------+-------------+------------+------------+ Right NonComp .42 NonComp .50  +-------+-----------+-------------+------------+------------+ Left NonComp .68 (2nd toe)NonComp Amp  +-------+-----------+-------------+------------+------------+ Bilateral ABIs appear essentially unchanged compared to prior study on 09/2022. Summary: Right: Resting right ankle-brachial index indicates noncompressible right lower extremity arteries. The right toe-brachial index is abnormal. Left: Resting left ankle-brachial index indicates noncompressible left lower extremity arteries. The left toe-brachial index is abnormal. Great toe amp; 2nd toe near normal TBI. *See table(s) above  for measurements and observations. Electronically signed by Cordella Shawl MD on 12/23/2023 at 7:24:13 AM. Final Labs:CBC: Recent Labs 06/10/25193006/11/25063106/12/25065206/13/250552 WBC12.1*8.16.36.8HGB12.8*10.8*11.6*11.4*HCT38.8*32.9*36.8*35.1*PLT81*76*85*95* COAGS: Recent Labs 03/16/25022806/10/25193006/11/251011 INR1.4*1.4*1.3* BMP: Recent Labs 06/10/25193006/11/25063106/12/25065206/13/250552 WJ865*865*864864 K3.83.74.74.1CL94*96*94*95*CO225242225 GLUCOSE148*85175*112*BUN31*35*61*35*CALCIUM8.1*7.8*8.0*8.4*CREATININE4.93*5.52*7.54*5.48*GFRNONAA12*11*7*11* LIVER FUNCTION TESTS: Recent Labs 03/24/25031606/10/25193006/11/25063106/12/250821 BILITOT -- 4.1*4.8*2.4*AST -- 54*49*187*ALT -- 51*4297*ALKPHOS -- 841*865*876EMNU -- 8.07.08.1ALBUMIN3.63.2*2.7*2.7* TUMOR MARKERS: No results for input(s): AFPTM, CEA, CA199, CHROMGRNA in the last 8760 hours. Assessment and Plan:Acute cholecystitis s/p percutaneous cholecystostomy drain placement on 6/11. *Patient is well overall, with expected discomfort from drain. *Good daily bilious output into drainage bag. *Patient may flush drain every 3 days, or sooner with concerns of decreased output or clogged drain. *Patient is currently undergoing extensive cardiac evaluation and is pending TAVR procedure. He will have to obtain clearance by his cardiac team prior to undergoing potential cholecystectomy. *Keep follow-up appointment with Surgery service on 8/11. *IR will see patient in clinic every 6-8 weeks for regular drain exchanges until he is cleared for cholecystectomy, or alternate plan by care team. *Drain was exchanged today by Dr. Johann. Return in 6-8 weeks, on a recurring basis, for drain exchanges. *Return to clinic precautions given to patient and his wife. *Both patient and his wife voiced understanding and are amenable to this plan. Electronically Signed:Charles A Carim PA-C7/30/2025, 5:56 PM Electronically Signed   By: JONETTA Johann M.D.   On: 01/01/2024 11:10   IR  Catheter Tube Change Result Date: 01/01/2024 INDICATION: Acute calculus cholecystitis, status post cholecystostomy catheter placement 11/12/2023, presents for scheduled exchange. No complicating features. EXAM: PERCUTANEOUS CHOLECYSTOSTOMY CATHETER EXCHANGE UNDER FLUOROSCOPY MEDICATIONS: No periprocedural antibiotics were indicated ANESTHESIA/SEDATION: Lidocaine  1% subcutaneous FLUOROSCOPY: Radiation Exposure Index (as provided by the fluoroscopic device): 22 mGy Kerma COMPLICATIONS: None immediate. PROCEDURE: Informed written consent was obtained from the patient after a thorough discussion of the procedural risks, benefits and alternatives.  All questions were addressed. Maximal Sterile Barrier Technique was utilized including caps, mask, sterile gowns, sterile gloves, sterile drape, hand hygiene and skin antiseptic. A timeout was performed prior to the initiation of the procedure. Small contrast injection through the existing catheter confirmed placement within the gallbladder lumen. Catheter cut and exchanged over an angled Glidewire. A new 10 French per pigtail catheter was advanced and positioned centrally in the gallbladder. Contrast injection confirms good positioning. Catheter secured externally 0 Prolene suture and placed to external drain bag. The patient tolerated the procedure well. IMPRESSION: 1. Technically successful exchange of cholecystostomy catheter under fluoroscopy. Electronically Signed   By: JONETTA Faes M.D.   On: 01/01/2024 11:08   PERIPHERAL VASCULAR CATHETERIZATION Result Date: 12/26/2023 See surgical note for result.   There are no new results to review at this time.  Previous records (including but not limited to H&P, progress notes, nursing notes, TOC management) were reviewed in assessment of this patient.  Labs: CBC: Recent Labs  Lab 01/18/24 0340 01/19/24 0442 01/20/24 0417 01/20/24 1752 01/21/24 0331 01/22/24 0347  WBC 11.8* 11.1* 10.7*  --  11.0* 12.6*  HGB  10.9* 10.7* 11.6* 11.6* 11.2* 9.8*  HCT 33.6* 32.9* 36.1* 34.0* 35.0* 30.1*  MCV 93.6 94.0 95.3  --  94.9 94.1  PLT 217 225 221  --  197 169   Basic Metabolic Panel: Recent Labs  Lab 01/19/24 0442 01/19/24 1844 01/20/24 0417 01/20/24 1752 01/21/24 0331 01/22/24 0347  NA 133* 134* 134* 124* 131* 132*  K 4.2 4.5 4.4 4.2 4.8 5.3*  CL 93* 92* 93* 86* 92* 90*  CO2 25 26 24   --  22 21*  GLUCOSE 129* 105* 105* 116* 109* 120*  BUN 43* 49* 55* 58* 72* 96*  CREATININE 7.16* 8.20* 8.78* 8.40* 10.74* 12.08*  CALCIUM  8.7* 9.0 9.1  --  8.5* 8.2*  MG 2.1  --   --   --  2.2  --    Liver Function Tests: Recent Labs  Lab 01/19/24 1844  AST 22  ALT <5  ALKPHOS 161*  BILITOT 0.8  PROT 9.4*  ALBUMIN  2.5*   CBG: Recent Labs  Lab 01/21/24 0614 01/21/24 1142 01/21/24 1549 01/21/24 2110 01/22/24 0630  GLUCAP 109* 121* 157* 129* 134*    Scheduled Meds:  (feeding supplement) PROSource Plus  30 mL Oral BID BM   aspirin  EC  81 mg Oral Daily   atorvastatin   20 mg Oral Daily   carvedilol   3.125 mg Oral BID   Chlorhexidine  Gluconate Cloth  6 each Topical Q0600   clopidogrel   75 mg Oral Daily   heparin   5,000 Units Subcutaneous Q8H   insulin  aspart  0-6 Units Subcutaneous TID WC   isosorbide  mononitrate  120 mg Oral Daily   levothyroxine   50 mcg Oral Daily   sevelamer  carbonate  800 mg Oral TID WC   sodium chloride  flush  3 mL Intravenous Q12H   tamsulosin   0.4 mg Oral QPC breakfast   Continuous Infusions:  sodium chloride      norepinephrine  (LEVOPHED ) Adult infusion     PRN Meds:.sodium chloride , acetaminophen  **OR** acetaminophen , albuterol , HYDROmorphone  (DILAUDID ) injection, menthol -cetylpyridinium, nitroGLYCERIN , oxyCODONE , polyethylene glycol, sodium chloride  flush  Family Communication: None at bedside  Disposition: Status is: Inpatient Remains inpatient appropriate because: PAD, gangrene, ESRD     Time spent: 39 minutes  Length of inpatient stay: 8  days  Author: Carliss LELON Canales, DO 01/22/2024 10:51 AM  For on call review [email or web address].

## 2024-01-22 NOTE — Progress Notes (Signed)
 [name], pt's wife, called RN to get an update. RN told wife that he had to come back from hemodialysis early because his blood pressure began to drop. RN also told her that pt was having more pain this afternoon (and received pain medication) because he had been working with PT/OT.

## 2024-01-22 NOTE — Progress Notes (Signed)
 [facility] Progress Note   Subjective:  Patient seen and examined in [facility] this morning. He reports that he is having some pain associated with his TMA. He was given some pain medication and reported so reprieve. He denies any dyspnea or CP. BP stable and K 5.3 this morning. Possible d/c later today?  Objective Vitals:   01/21/24 2126 01/22/24 0036 01/22/24 0217 01/22/24 0412  BP: 102/69 124/64  121/75  Pulse: 88 87 77 86  Resp:  20 14 20   Temp:  97.9 F (36.6 C)  98.1 F (36.7 C)  TempSrc:  Oral  Oral  SpO2:  100% 97% 100%  Weight:   76 kg   Height:       Physical Exam General: Alert, sitting up in bed, nad  Heart: RRR Lungs: Clear  Abdomen: soft, NTND Extremities: no LE edema, L TMA w/wound vac in place Dialysis Access: LU AVF +b/t   Filed Weights   01/19/24 1626 01/21/24 0500 01/22/24 0217  Weight: 75.2 kg 75.1 kg 76 kg    Intake/Output Summary (Last 24 hours) at 01/22/2024 0812 Last data filed at 01/21/2024 1600 Gross per 24 hour  Intake 240 ml  Output --  Net 240 ml    Additional Objective Labs: Basic Metabolic Panel: Recent Labs  Lab 01/20/24 0417 01/20/24 1752 01/21/24 0331 01/22/24 0347  NA 134* 124* 131* 132*  K 4.4 4.2 4.8 5.3*  CL 93* 86* 92* 90*  CO2 24  --  22 21*  GLUCOSE 105* 116* 109* 120*  BUN 55* 58* 72* 96*  CREATININE 8.78* 8.40* 10.74* 12.08*  CALCIUM  9.1  --  8.5* 8.2*   Liver Function Tests: Recent Labs  Lab 01/19/24 1844  AST 22  ALT <5  ALKPHOS 161*  BILITOT 0.8  PROT 9.4*  ALBUMIN  2.5*   CBC: Recent Labs  Lab 01/18/24 0340 01/19/24 0442 01/20/24 0417 01/20/24 1752 01/21/24 0331 01/22/24 0347  WBC 11.8* 11.1* 10.7*  --  11.0* 12.6*  HGB 10.9* 10.7* 11.6* 11.6* 11.2* 9.8*  HCT 33.6* 32.9* 36.1* 34.0* 35.0* 30.1*  MCV 93.6 94.0 95.3  --  94.9 94.1  PLT 217 225 221  --  197 169   CBG: Recent Labs  Lab 01/21/24 0614 01/21/24 1142 01/21/24 1549 01/21/24 2110 01/22/24 0630  GLUCAP 109* 121*  157* 129* 134*    Medications:  sodium chloride      norepinephrine  (LEVOPHED ) Adult infusion      (feeding supplement) PROSource Plus  30 mL Oral BID BM   aspirin  EC  81 mg Oral Daily   atorvastatin   20 mg Oral Daily   carvedilol   3.125 mg Oral BID   Chlorhexidine  Gluconate Cloth  6 each Topical Q0600   clopidogrel   75 mg Oral Daily   heparin   5,000 Units Subcutaneous Q8H   insulin  aspart  0-6 Units Subcutaneous TID WC   isosorbide  mononitrate  120 mg Oral Daily   levothyroxine   50 mcg Oral Daily   sevelamer  carbonate  800 mg Oral TID WC   sodium chloride  flush  3 mL Intravenous Q12H   tamsulosin   0.4 mg Oral QPC breakfast    Dialysis Orders: [facility] TTS - No heparin  - Left AVF (in use for 2 weeks) - EDW 81 kg 4 hr 350/800 2K/2.5 Ca Bicarb 39 Mircera: 50 mcg ordered - but has not received any ESA yet No VDRA Renvela  800 mg TID WC   Assessment/Plan: PAD/ 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 Then TMA completed on 01/17/24 by Dr. Serene.  Severe Aortic Stenosis: TAVR 01/20/24  ESRD:  [facility] HD TTS. Had HD off schedule Monday to accommodate for surgery on Tues. Resume regular schedule on Thursday. Next HD today.  Hypertension/volume: BP in goal. On carvedilol  3.125mg  BID - hold in AM on HD days.  Use albumin  with HD for BP support. UF as able.  Chronic HFrEF: Continue volume removal with HD.  Recent acute cholecystitis requiring cholecystostomy drain 6/25: drain is in place.  Anemia: Hgb 10-11.  No indication for ESA at this time.  Metabolic bone disease: restarted home Sevelamer  800 TID WC. Corrected calcium  in goal. Check phos. Not on VDRA at his OP clinic.  DMT2: Insulin  per primary.  Nutrition:  Albumin  low. Add supp. On renal diet with fluid restriction  Belvie Och, NP [facility] 01/22/2024,8:12 AM

## 2024-01-22 NOTE — Progress Notes (Signed)
 Pt's daughter spoke with RN. Update given. Reported that pt was doing well, he went to HD this am (but had to stop procedure early due to hypotension), and he has had his pain medication twice today. RN told daughter that he was currently working with PT/OT and was sitting up in the chair.

## 2024-01-22 NOTE — Progress Notes (Signed)
 IP rehab admissions - After a peer to peer review with Manufacturing engineer, we have approval for acute inpatient rehab admission.  Will have my partner follow up in am for medical readiness.  We do anticipate bed availability tomorrow if patient is medically stable.  [phone]

## 2024-01-22 NOTE — Progress Notes (Signed)
 Contacted [facility] to advise clinic staff that pt is being evaluated for inpt rehab here at [facility] and insurance is pending. Will advise clinic of pt's final d/c plan once confirmed. Will assist as needed.   Randine Mungo Dialysis Navigator [phone]

## 2024-01-23 ENCOUNTER — Inpatient Hospital Stay (HOSPITAL_COMMUNITY)
Admit: 2024-01-23 | Discharge: 2024-01-23 | Disposition: A | Discharge status: Home / Self Care | Attending: Physician Assistant | Admitting: Physician Assistant

## 2024-01-23 DIAGNOSIS — I5042 Chronic combined systolic (congestive) and diastolic (congestive) heart failure: Secondary | ICD-10-CM | POA: Diagnosis not present

## 2024-01-23 DIAGNOSIS — D638 Anemia in other chronic diseases classified elsewhere: Secondary | ICD-10-CM | POA: Diagnosis not present

## 2024-01-23 DIAGNOSIS — Z952 Presence of prosthetic heart valve: Secondary | ICD-10-CM

## 2024-01-23 DIAGNOSIS — I447 Left bundle-branch block, unspecified: Secondary | ICD-10-CM

## 2024-01-23 DIAGNOSIS — I2510 Atherosclerotic heart disease of native coronary artery without angina pectoris: Secondary | ICD-10-CM | POA: Diagnosis not present

## 2024-01-23 LAB — BASIC METABOLIC PANEL WITH GFR
Anion gap: 18 — ABNORMAL HIGH (ref 5–15)
BUN: 61 mg/dL — ABNORMAL HIGH (ref 8–23)
CO2: 23 mmol/L (ref 22–32)
Calcium: 9.1 mg/dL (ref 8.9–10.3)
Chloride: 92 mmol/L — ABNORMAL LOW (ref 98–111)
Creatinine, Ser: 8.48 mg/dL — ABNORMAL HIGH (ref 0.61–1.24)
GFR, Estimated: 6 mL/min — ABNORMAL LOW (ref >60)
Glucose, Bld: 129 mg/dL — ABNORMAL HIGH (ref 70–99)
Potassium: 4.8 mmol/L (ref 3.5–5.1)
Sodium: 133 mmol/L — ABNORMAL LOW (ref 135–145)

## 2024-01-23 LAB — CBC
HCT: 32.9 % — ABNORMAL LOW (ref 39.0–52.0)
Hemoglobin: 10.6 g/dL — ABNORMAL LOW (ref 13.0–17.0)
MCH: 30.1 pg (ref 26.0–34.0)
MCHC: 32.2 g/dL (ref 30.0–36.0)
MCV: 93.5 fL (ref 80.0–100.0)
Platelets: 178 K/uL (ref 150–400)
RBC: 3.52 MIL/uL — ABNORMAL LOW (ref 4.22–5.81)
RDW: 15.1 % (ref 11.5–15.5)
WBC: 13.3 K/uL — ABNORMAL HIGH (ref 4.0–10.5)
nRBC: 0 % (ref 0.0–0.2)

## 2024-01-23 LAB — BPAM RBC
Blood Product Expiration Date: 202509052359
Blood Product Expiration Date: 202509172359
Unit Type and Rh: 5100
Unit Type and Rh: 5100

## 2024-01-23 LAB — TYPE AND SCREEN
ABO/RH(D): O POS
Antibody Screen: NEGATIVE
Unit division: 0
Unit division: 0

## 2024-01-23 LAB — GLUCOSE, CAPILLARY
Glucose-Capillary: 127 mg/dL — ABNORMAL HIGH (ref 70–99)
Glucose-Capillary: 115 mg/dL — ABNORMAL HIGH (ref 70–99)
Glucose-Capillary: 124 mg/dL — ABNORMAL HIGH (ref 70–99)
Glucose-Capillary: 125 mg/dL — ABNORMAL HIGH (ref 70–99)

## 2024-01-23 MED ORDER — CHLORHEXIDINE GLUCONATE CLOTH 2 % EX PADS
6.0000 | MEDICATED_PAD | Freq: Every day | CUTANEOUS | Status: DC
  Administered 2024-01-24 – 2024-01-28 (×5): 6 via TOPICAL

## 2024-01-23 NOTE — Plan of Care (Signed)
  Problem: Education: Goal: Knowledge of General Education information will improve Description: Including pain rating scale, medication(s)/side effects and non-pharmacologic comfort measures Outcome: Progressing   Problem: Health Behavior/Discharge Planning: Goal: Ability to manage health-related needs will improve Outcome: Progressing   Problem: Clinical Measurements: Goal: Will remain free from infection Outcome: Progressing Goal: Respiratory complications will improve Outcome: Progressing   Problem: Nutrition: Goal: Adequate nutrition will be maintained Outcome: Progressing   Problem: Coping: Goal: Level of anxiety will decrease Outcome: Progressing   Problem: Safety: Goal: Ability to remain free from injury will improve Outcome: Progressing   Problem: Skin Integrity: Goal: Risk for impaired skin integrity will decrease Outcome: Progressing

## 2024-01-23 NOTE — Progress Notes (Signed)
  [facility]   MULTIDISCIPLINARY HEART VALVE TEAM   Pt was approved for CIR with likely discharge tomorrow AM. I have placed a Zio AT for new LBBB after TAVR. I have arranged for follow up in our clinic for echo and OV on 02/23/24.   Lamarr Hummer PA-C  [facility]

## 2024-01-23 NOTE — Progress Notes (Addendum)
  Progress Note    01/23/2024 8:02 AM 3 Days Post-Op  Subjective:  no complaints   Vitals:   01/23/24 0308 01/23/24 0735  BP: (!) 109/59 92/64  Pulse: 90 100  Resp: 20 20  Temp: 98.3 F (36.8 C) 97.6 F (36.4 C)  SpO2: 91% 99%   Physical Exam: Cardiac:  regular Lungs:  non labored Incisions:  left TMA prevena removed, left TMA with dusky margins, staple intact  Neurologic: alert and oriented  CBC    Component Value Date/Time   WBC 13.3 (H) 01/23/2024 0418   RBC 3.52 (L) 01/23/2024 0418   HGB 10.6 (L) 01/23/2024 0418   HGB 11.5 (L) 05/16/2023 1429   HCT 32.9 (L) 01/23/2024 0418   HCT 35.3 (L) 05/16/2023 1429   PLT 178 01/23/2024 0418   PLT 144 (L) 05/16/2023 1429   MCV 93.5 01/23/2024 0418   MCV 90 05/16/2023 1429   MCH 30.1 01/23/2024 0418   MCHC 32.2 01/23/2024 0418   RDW 15.1 01/23/2024 0418   RDW 13.5 05/16/2023 1429   LYMPHSABS 0.7 11/11/2023 1930   LYMPHSABS 0.7 05/16/2023 1429   MONOABS 0.8 11/11/2023 1930   EOSABS 0.0 11/11/2023 1930   EOSABS 0.1 05/16/2023 1429   BASOSABS 0.0 11/11/2023 1930   BASOSABS 0.0 05/16/2023 1429    BMET    Component Value Date/Time   NA 133 (L) 01/23/2024 0418   NA 145 (H) 07/14/2023 1412   NA 136 08/30/2013 1025   K 4.8 01/23/2024 0418   K 3.7 08/30/2013 1025   CL 92 (L) 01/23/2024 0418   CL 101 08/30/2013 1025   CO2 23 01/23/2024 0418   CO2 31 08/30/2013 1025   GLUCOSE 129 (H) 01/23/2024 0418   GLUCOSE 310 (H) 08/30/2013 1025   BUN 61 (H) 01/23/2024 0418   BUN 97 (HH) 07/14/2023 1412   BUN 17 08/30/2013 1025   CREATININE 8.48 (H) 01/23/2024 0418   CREATININE 1.15 08/30/2013 1025   CALCIUM  9.1 01/23/2024 0418   CALCIUM  9.4 08/30/2013 1025   GFRNONAA 6 (L) 01/23/2024 0418   GFRNONAA >60 08/30/2013 1025   GFRAA 27 (L) 02/22/2020 0539   GFRAA >60 08/30/2013 1025    INR    Component Value Date/Time   INR 1.1 01/19/2024 1844     Intake/Output Summary (Last 24 hours) at 01/23/2024 0802 Last data filed  at 01/23/2024 0400 Gross per 24 hour  Intake 30 ml  Output 950 ml  Net -920 ml     Assessment/Plan:  68 y.o. male is s/p left TMA 3 Days Post-Op   Recovering well after TAVR Prevena removed this morning from left TMA Will need to continue to monitor margins as they are dusky Dry dressings applied Heal weight bearing as tolerated   [name], PA-C [facility] [phone] 01/23/2024 8:02 AM  I agree with the above.  Wound VAC was removed today.  He has some ischemic changes to the distal aspect of his incision as documented in the photo below.  He remains at risk for below-knee amputation.  [name]

## 2024-01-23 NOTE — Progress Notes (Signed)
 Inpatient Rehab Admissions Coordinator:   Discussed with Dr. Emeline and Dr. Babs.  Wound vac removed today.  Margins dusky.  Pt with elevated WBC and hypotension today.  We will hold on admit to CIR and see how he does over the weekend.   Reche Lowers, PT, DPT Admissions Coordinator [phone] 01/23/24  11:22 AM

## 2024-01-23 NOTE — Progress Notes (Signed)
 Progress Note   Patient: Phillip [name] FMW:[id number] DOB: 1956-06-02 DOA: 01/14/2024  DOS: the patient was seen and examined on 01/23/2024   Brief hospital course:  67/M w ESRD on HD TTS, DM-1, recent acute cholecystitis June 2025) requiring cholecystostomy tube placement, chronic HFrEF, aortic stenosis (TAVR postponed due to left third toe gangrene), PAD-s/p PCI to left tibia-fibula/angioplasty on 7/25 who was admitted by vascular surgery 8/13 for an elective left third toe amputation. H/o significant issues with nonhealing ulceration of his left foot-progressing into a gangrenous left third toe, underwent a angiogram of LLE with transluminal angioplasty/stent placement to the left tibioperoneal trunk on 7/25 at [facility]. Plans were to proceed with a left third toe amputation post revascularization. However due to his significant underlying medical comorbidities, this could not be done at [facility], referred to the [facility] surgery and admitted to [facility].   8/13 - left 3rd toe amputation 8/15 - left TMA and wound vac placment 8/19 - TAVR 8/22 - wound vac removed   Assessment and Plan:   Left third toe gangrene - S/p transmetatarsal amputation with wound VAC placement 8/15.  Wound VAC removed today 8/22.  Leukocytosis uptrending slightly.  Severe aortic stenosis - S/p TAVR 8/19.  Cardiology following closely.  Cardiology planning to place Zio patch today prior to DC given new LBBB.   Chronic HFrEF - Appears euvolemic at this time.  Volume removal per HD/UF.   ESRD - HD TTS.  Nephrology following closely.  LUE AVF.   CAD with CABG - Last cath 2/25 noting severe CAD with all vein grafts occluded.  Continue aspirin , Plavix , statin.   Recent history of acute cholecystitis requiring cholecystostomy drain-11/2023 - Percutaneous cholecystectomy drain in place.  Per outpatient general surgery note, no evident surgery were done post TAVR.   Diabetes mellitus - A1c 5.8 suggesting excellent  control.  Insulin  sliding scale more.   Normocytic anemia - Likely anemia of chronic kidney disease.  Hemoglobin stable.   Hypothyroidism - Synthroid  on board.   Goals of care - Currently being evaluated for inpatient rehab/CIR.  Working closely with TOC on disposition planning.  Anticipate discharge Monday.   Subjective: Patient resting comfortably this morning.  Denies any fever, chills, chest pain, nausea, vomiting abdominal pain.  Wound VAC removed this morning.  No purulence.  Physical Exam:  Vitals:   01/23/24 0308 01/23/24 0735 01/23/24 0829 01/23/24 0900  BP: (!) 109/59 92/64 (!) 90/56 108/65  Pulse: 90 100 94   Resp: 20 20 17    Temp: 98.3 F (36.8 C) 97.6 F (36.4 C)    TempSrc: Oral Oral    SpO2: 91% 99% 100%   Weight: 74.9 kg     Height:        GENERAL:  Alert, pleasant, no acute distress  HEENT:  EOMI CARDIOVASCULAR:  RRR, no murmurs appreciated RESPIRATORY:  Clear to auscultation, no wheezing, rales, or rhonchi GASTROINTESTINAL:  Soft, nontender, nondistended EXTREMITIES:  No LE edema bilaterally NEURO:  No new focal deficits appreciated SKIN: LLE TMA, no purulence, dusky distal incision PSYCH:  Appropriate mood and affect    Data Reviewed:  Imaging Studies: ECHOCARDIOGRAM COMPLETE Result Date: 01/21/2024    ECHOCARDIOGRAM REPORT   Patient Name:   Phillip [name] Date of Exam: 01/21/2024 Medical Rec #:  [id number]        Height:       72.0 in Accession #:    [id number]       Weight:  165.6 lb Date of Birth:  03-04-1956       BSA:          1.966 m Patient Age:    67 years         BP:           128/75 mmHg Patient Gender: M                HR:           77 bpm. Exam Location:  Inpatient Procedure: 2D Echo, Cardiac Doppler and Color Doppler (Both Spectral and Color            Flow Doppler were utilized during procedure). Indications:    Post TAVR evaluation. ; I35.0 Nonrheumatic aortic (valve)                 stenosis  History:        Patient has prior  history of Echocardiogram examinations, most                 recent 01/20/2024. CHF, CAD, Prior CABG, Pulmonary HTN, Aortic                 Valve Disease, Signs/Symptoms:Edema; Risk Factors:Hypertension                 and Dyslipidemia. ESRD.                 Aortic Valve: 29 mm Edwards Sapien prosthetic, stented (TAVR)                 valve is present in the aortic position. Procedure Date:                 01/20/2024.  Sonographer:    Ellouise Mose RDCS Referring Phys: [id number] [facility] R THOMPSON  Sonographer Comments: Technically difficult study due to poor echo windows. IMPRESSIONS  1. Left ventricular ejection fraction, by estimation, is 30 to 35%. The left ventricle has moderately decreased function. The left ventricle demonstrates regional wall motion abnormalities (see scoring diagram/findings for description). The left ventricular internal cavity size was moderately dilated. There is mild concentric left ventricular hypertrophy. Left ventricular diastolic parameters are consistent with Grade I diastolic dysfunction (impaired relaxation).  2. The mitral valve is degenerative. Mild to moderate mitral valve regurgitation. No evidence of mitral stenosis. Moderate mitral annular calcification.  3. The aortic valve has been repaired/replaced. There is a 29 mm Edwards Sapien prosthetic (TAVR) valve present in the aortic position. Procedure Date: 01/20/2024. Echo findings are consistent with trivial perivalvular leak of the aortic prosthesis.  4. Right ventricular systolic function is moderately reduced. The right ventricular size is normal.  5. Left atrial size was mildly dilated. FINDINGS  Left Ventricle: Left ventricular ejection fraction, by estimation, is 30 to 35%. The left ventricle has moderately decreased function. The left ventricle demonstrates regional wall motion abnormalities. The left ventricular internal cavity size was moderately dilated. There is mild concentric left ventricular hypertrophy. Left  ventricular diastolic parameters are consistent with Grade I diastolic dysfunction (impaired relaxation).  LV Wall Scoring: The inferior wall and posterior wall are akinetic. Right Ventricle: The right ventricular size is normal. No increase in right ventricular wall thickness. Right ventricular systolic function is moderately reduced. Left Atrium: Left atrial size was mildly dilated. Right Atrium: Right atrial size was normal in size. Pericardium: There is no evidence of pericardial effusion. Mitral Valve: The mitral valve is degenerative in appearance. Moderate mitral annular calcification. Mild to moderate  mitral valve regurgitation. No evidence of mitral valve stenosis. MV peak gradient, 7.8 mmHg. The mean mitral valve gradient is 3.0 mmHg. Tricuspid Valve: The tricuspid valve is normal in structure. Tricuspid valve regurgitation is trivial. No evidence of tricuspid stenosis. Aortic Valve: Trivial perivalvular leak and 2 o'clock. The aortic valve has been repaired/replaced. Aortic valve regurgitation is not visualized. No aortic stenosis is present. Aortic valve mean gradient measures 5.0 mmHg. Aortic valve peak gradient measures 10.2 mmHg. Aortic valve area, by VTI measures 4.55 cm. There is a 29 mm Edwards Sapien prosthetic, stented (TAVR) valve present in the aortic position. Procedure Date: 01/20/2024. Echo findings are consistent with normal structure and function of the aortic valve prosthesis. Pulmonic Valve: The pulmonic valve was normal in structure. Pulmonic valve regurgitation is not visualized. No evidence of pulmonic stenosis. Aorta: The aortic root and ascending aorta are structurally normal, with no evidence of dilitation. IAS/Shunts: There is redundancy of the interatrial septum. No atrial level shunt detected by color flow Doppler.  LEFT VENTRICLE PLAX 2D LVIDd:         5.18 cm      Diastology LVIDs:         4.80 cm      LV e' medial:    3.92 cm/s LV PW:         1.28 cm      LV E/e' medial:   22.8 LV IVS:        1.39 cm      LV e' lateral:   5.55 cm/s LVOT diam:     2.90 cm      LV E/e' lateral: 16.1 LV SV:         124 LV SV Index:   63 LVOT Area:     6.61 cm  LV Volumes (MOD) LV vol d, MOD A2C: 277.0 ml LV vol d, MOD A4C: 198.0 ml LV vol s, MOD A2C: 206.0 ml LV vol s, MOD A4C: 138.0 ml LV SV MOD A2C:     71.0 ml LV SV MOD A4C:     198.0 ml LV SV MOD BP:      71.8 ml RIGHT VENTRICLE            IVC RV S prime:     7.40 cm/s  IVC diam: 1.70 cm TAPSE (M-mode): 0.7 cm LEFT ATRIUM             Index        RIGHT ATRIUM           Index LA diam:        3.96 cm 2.01 cm/m   RA Area:     16.20 cm LA Vol (A2C):   38.2 ml 19.43 ml/m  RA Volume:   46.30 ml  23.55 ml/m LA Vol (A4C):   28.2 ml 14.34 ml/m LA Biplane Vol: 32.4 ml 16.48 ml/m  AORTIC VALVE AV Area (Vmax):    5.08 cm AV Area (Vmean):   4.73 cm AV Area (VTI):     4.55 cm AV Vmax:           160.00 cm/s AV Vmean:          100.467 cm/s AV VTI:            0.273 m AV Peak Grad:      10.2 mmHg AV Mean Grad:      5.0 mmHg LVOT Vmax:         123.00 cm/s LVOT Vmean:  71.900 cm/s LVOT VTI:          0.188 m LVOT/AV VTI ratio: 0.69  AORTA Ao Root diam: 3.51 cm Ao Asc diam:  3.59 cm MITRAL VALVE MV Area (PHT): 4.15 cm     SHUNTS MV Area VTI:   3.42 cm     Systemic VTI:  0.19 m MV Peak grad:  7.8 mmHg     Systemic Diam: 2.90 cm MV Mean grad:  3.0 mmHg MV Vmax:       1.40 m/s MV Vmean:      88.5 cm/s MV Decel Time: 183 msec MV E velocity: 89.40 cm/s MV A velocity: 127.00 cm/s MV E/A ratio:  0.70 Toribio Fuel MD Electronically signed by Toribio Fuel MD Signature Date/Time: 01/21/2024/12:14:45 PM    Final    ECHOCARDIOGRAM LIMITED Result Date: 01/20/2024    ECHOCARDIOGRAM LIMITED REPORT   Patient Name:   Phillip [name] Date of Exam: 01/20/2024 Medical Rec #:  [id number]        Height:       72.0 in Accession #:    [id number]       Weight:       165.8 lb Date of Birth:  18-Jan-1956       BSA:          1.967 m Patient Age:    67 years         BP:            105/60 mmHg Patient Gender: M                HR:           61 bpm. Exam Location:  Inpatient Procedure: Limited Color Doppler (Both Spectral and Color Flow Doppler were            utilized during procedure). Indications:     aortic stenosis. TAVR  History:         Patient has prior history of Echocardiogram examinations, most                  recent 08/01/2023. CAD, Prior CABG, end stage renal disease;                  Risk Factors:Hypertension, Dyslipidemia, Diabetes and Sleep                  Apnea.                  Aortic Valve: 29 mm valve is present in the aortic position.                  Procedure Date: 01/20/2024.  Sonographer:     Tinnie Barefoot RDCS Referring Phys:  [id number] [facility] THOMPSON Diagnosing Phys: Darryle Decent MD IMPRESSIONS  1. Echo guided TAVR. Vmax 1.21 m/s, MG 3.0 mmHG, EOA 4.57 cm2. No regurgitation or paravalvular leak. Aortic valve regurgitation is not visualized. There is a 29 mm valve present in the aortic position. Procedure Date: 01/20/2024. Aortic valve area, by VTI measures 4.57 cm. Aortic valve mean gradient measures 3.0 mmHg. Aortic valve Vmax measures 1.21 m/s.  2. Left ventricular ejection fraction, by estimation, is 30 to 35%. The left ventricle has moderately decreased function.  3. Right ventricular systolic function is normal. The right ventricular size is normal.  4. The mitral valve is degenerative. Mild mitral valve regurgitation. No evidence of mitral stenosis. FINDINGS  Left Ventricle: Left ventricular ejection fraction, by estimation, is  30 to 35%. The left ventricle has moderately decreased function.  LV Wall Scoring: The inferior wall and posterior wall are akinetic. Right Ventricle: The right ventricular size is normal. No increase in right ventricular wall thickness. Right ventricular systolic function is normal. Pericardium: There is no evidence of pericardial effusion. Mitral Valve: The mitral valve is degenerative in appearance. Mild mitral valve  regurgitation. No evidence of mitral valve stenosis. Tricuspid Valve: The tricuspid valve is grossly normal. Tricuspid valve regurgitation is mild . No evidence of tricuspid stenosis. Aortic Valve: Echo guided TAVR. Vmax 1.21 m/s, MG 3.0 mmHG, EOA 4.57 cm2. No regurgitation or paravalvular leak. Aortic valve regurgitation is not visualized. Aortic regurgitation PHT measures 446 msec. Aortic valve mean gradient measures 3.0 mmHg. Aortic valve peak gradient measures 5.9 mmHg. Aortic valve area, by VTI measures 4.57 cm. There is a 29 mm valve present in the aortic position. Procedure Date: 01/20/2024. Aorta: The aortic root and ascending aorta are structurally normal, with no evidence of dilitation. Additional Comments: Spectral Doppler performed. Color Doppler performed.  LEFT VENTRICLE PLAX 2D LVIDd:         5.30 cm LV PW:         1.00 cm LV IVS:        1.20 cm LVOT diam:     2.68 cm LV SV:         105 LV SV Index:   54 LVOT Area:     5.64 cm  LV Volumes (MOD) LV vol d, MOD A4C: 181.0 ml LV vol s, MOD A4C: 117.0 ml LV SV MOD A4C:     181.0 ml LEFT ATRIUM         Index LA diam:    3.70 cm 1.88 cm/m  AORTIC VALVE AV Area (Vmax):    4.90 cm AV Area (Vmean):   4.80 cm AV Area (VTI):     4.57 cm AV Vmax:           121.00 cm/s AV Vmean:          80.400 cm/s AV VTI:            0.231 m AV Peak Grad:      5.9 mmHg AV Mean Grad:      3.0 mmHg LVOT Vmax:         105.00 cm/s LVOT Vmean:        68.400 cm/s LVOT VTI:          0.187 m LVOT/AV VTI ratio: 0.81 AI PHT:            446 msec  AORTA Ao Root diam: 3.40 cm Ao Asc diam:  3.30 cm  SHUNTS Systemic VTI:  0.19 m Systemic Diam: 2.68 cm Darryle Decent MD Electronically signed by Darryle Decent MD Signature Date/Time: 01/20/2024/6:03:09 PM    Final    Structural Heart Procedure Result Date: 01/20/2024 See surgical note for result.  IR Radiologist Eval & Mgmt Result Date: 01/01/2024 : Chief Complaint:Patient was seen in consultation today for acute cholecystitis s/p  percutaneous cholecystostomy drain placement,Referring Physician(s):Dr. Dreama Hanger, MD Supervising Physician: Johann, DanielHistory of Present Illness:Zandon D Rekowski is a 68 y.o. male with past medical history of ESRD on HD, severe aortic stenosis planning on TAVR soon, HFrEF, NSTEMI 2022, CAD s/p CABG in 2003, GERD, DM2, HTN, thrombocytopenia, and prior lap appendectomy in 2020 who presented to [facility] ED with acute abdominal pain after he was found to be febrile at his dialysis center 6/10. Initially his RUQ  US  was nonspecific for acute cholecystitis despite RUQ pain, tachycardia, fever, and elevated lactic acid. However, subsequent MR Abdomen/MRCP showed a distended gallbladder with innumerable stones, without biliary duct dilation. Due to notable cardiac and ESRD comorbidities, patient was recommended for percutaneous cholecystostomy drain placement, which was placed in IR on 6/11. He improved subsequently, and eventually discharged with multiple follow-up appointments, including with outpatient IR clinic. His planned TAVR pprocedure was postponed in light of his acute cholecystitis.Patient has been seen by cardiology and vascular surgery. His TAVR remains pending. He was informed by his cardiology team that his potential cholecystectomy will have to be deferred pending cardiac evaluation, TAVR procedure, and eventual clearance.Patient presents to IR clinic today for 7 week follow-up. He endorses expected discomfort from his drain, but other is overall well. He is tired since his dialysis session yesterday. He has a necrotic toe, being evaluated, that causes him pain. His wife is at his side. His drain has good bilious output daily. His wife flushes it daily. He denies fevers, chills, abdominal pain, nausea, vomiting. His surgical follow-up appointment is scheduled for 8/11 with Dr. Dasie. Past Medical History: Diagnosis * : Date . * : Abdominopelvic abscess (HCC) * : 09/29/2018 . * : Abscess of appendix * :  09/22/2018 . * : Acquired spondylolisthesis of lumbosacral region * : 03/20/2020 . * : Acute cholecystitis * : 11/15/2023 . * : Acute kidney injury (HCC) * : 10/07/2018 . * : Acute kidney injury superimposed on chronic kidney disease (HCC) * : 11/06/2020 . * : Acute kidney injury superimposed on CKD (HCC) * : 02/18/2020 . * : Acute on chronic systolic (congestive) heart failure (HCC) * : 07/26/2015 * : Formatting of this note might be different from the original. Formatting of this note might be different from the original. Formatting of this note might be different from the original. revenkar EF 35% Formatting of this note might be different from the original. revenkar EF 35% . * : Acute on chronic systolic CHF (congestive heart failure) (HCC) * : 08/12/2023 . * : Anasarca * : 07/01/2023 . * : Anemia of chronic disease * : 08/18/2019 . * : Anemia, chronic disease * : 08/18/2019 . * : Aortic atherosclerosis (HCC) * : . * : Aortic stenosis * : * : a.) TTE 09/15/2018: mild-mod (MPG 19); b.) TTE 03/21/2021: mild-mod (MPG 14.3); c.) TTE 04/24/2022: mild- mod (MPG 15) . * : Aortic stenosis, moderate * : 10/15/2019 . * : Appendicitis * : 09/08/2018 . * : ARF (acute renal failure) (HCC) * : 02/19/2020 . * : Asthma * : . * : Atherosclerosis of native arteries of the extremities with ulceration (HCC) * : 02/24/2018 * : Formatting of this note might be different from the original. Last Assessment & Plan: His ABIs today are 1.07 on the right and 0.99 on the left with biphasic waveforms. Although these pressures may be somewhat elevated from medial calcification, his flow does appear to be reasonably good. His left ABI was 0.58 prior to intervention. At this point, we will stretch out his follow-up and see hi . * : Benign hypertension with CKD (chronic kidney disease) stage IV (HCC) * : 02/24/2018 . * : Benign prostatic hyperplasia without lower urinary tract symptoms * : 01/14/2018 . * : Bilateral lower extremity edema * :  11/04/2018 . * : Bradycardia * : 03/25/2018 . * : Bruit of right carotid artery * : 07/26/2015 . * : CAD (coronary artery disease) * :  2003 * : a.) s/p 4v CABG 2003 . * : Cardiac murmur * : 07/26/2015 . * : Choledocholithiasis * : 11/12/2023 . * : Chronic combined systolic and diastolic CHF (congestive heart failure) (HCC) * : 07/26/2015 * : a.) TTE 09/15/2018: EF 50-55%, mod LVH, RVE, BAE, mild-mod TR, AoV sclerosis, G1DD; b.) MPI 10/27/2019: EF <30%; c.) TTE 03/21/2021: EF 35-40%, post AK, inf HK, mod LVH, mod red RVSF, mod LAE, mod Aov sclerosis, G2DD; c.) TTE 04/24/2022: EF 35-40%, post AK, glok HK, mod LVE, mod red RVSF, mild-mod MR, Aov sclerosis, G3DD . * : Chronic idiopathic constipation * : 11/02/2018 . * : Chronic low back pain without sciatica * : 12/02/2018 . * : Chronic pain of right hip * : 03/31/2020 . * : Chronic systolic heart failure (HCC) * : 07/26/2015 * : Formatting of this note might be different from the original. revenkar EF 35% . * : CKD (chronic kidney disease) stage 4, GFR 15-29 ml/min (HCC) * : 10/07/2018 . * : Congestive heart failure (HCC) * : 07/26/2015 * : Last Assessment & Plan: Poor cardiac function could certainly be contributing to poor blood supply to the feet and toes as well. Formatting of this note might be different from the original. Formatting of this note might be different from the original. Last Assessment & Plan: Poor cardiac function could certainly be contributing to poor blood supply to the feet and toes as well. Last . * : Coronary artery disease * : . * : Diabetes mellitus without complication (HCC) * : . * : Dysphagia * : 07/26/2015 . * : Encounter for long-term (current) use of insulin  (HCC) * : 09/27/2020 . * : Erectile dysfunction * : 07/14/2017 . * : ESRD on dialysis (HCC) * : 11/15/2023 . * : Essential hypertension * : 02/24/2018 . * : Fever * : 11/07/2020 . * : Foot ulcer (HCC) * : 09/27/2020 * : left foot Formatting of this note might be different from the  original. Formatting of this note might be different from the original. left foot . * : Foot ulcer, left (HCC) * : . * : GERD (gastroesophageal reflux disease) * : . * : Heart murmur * : 07/26/2015 . * : Hematuria * : 07/27/2015 . * : High risk medication use * : 07/26/2015 . * : History of marijuana use * : . * : Hyperkalemia * : 05/29/2020 . * : Hyperlipidemia * : 07/26/2015 . * : Hyperlipidemia associated with type 2 diabetes mellitus (HCC) * : 02/18/2020 . * : Hypertension * : . * : Hypertension associated with diabetes (HCC) * : . * : Hypoglycemia * : 05/07/2019 . * : Hypothyroidism * : 11/14/2015 . * : Hypothyroidism (acquired) * : 11/14/2015 . * : Insomnia * : 11/02/2018 . * : Kidney insufficiency * : . * : Lumbar spondylosis * : 03/20/2020 . * : Malaise and fatigue * : 03/25/2018 . * : Malnutrition of moderate degree * : 08/20/2023 . * : Mitral regurgitation * : 05/12/2019 . * : Moderate aortic regurgitation * : 05/12/2019 . * : Myocardial infarction due to demand ischemia [facility]) * : 09/26/2020 * : a.) Type II NSTEMI; b.) troponins were trended 0.54 --> 0.56 --> 0.52 ng/mL . * : Non-compliance * : 05/05/2019 . * : NSTEMI (non-ST elevated myocardial infarction) (HCC) * : 09/27/2020 . * : Osseous and subluxation stenosis of intervertebral foramina of lumbar region * : 03/20/2020 . * :  Osteoarthritis * : 10/06/2019 . * : Pancytopenia (HCC) * : 08/12/2023 . * : Peripheral vascular disease (HCC) * : . * : Personal history of tobacco use, presenting hazards to health * : 09/27/2020 . * : Polyneuropathy in diabetes (HCC) * : 05/07/2019 . * : Primary osteoarthritis involving multiple joints * : 10/06/2019 . * : Primary osteoarthritis of right hip * : 02/21/2020 . * : Pulmonary HTN (HCC) * : * : a.) TTE 05/12/2019: PASP 71; b.) TTE 09/27/2020: PASP >70; c.) TTE 03/21/2021: RVSP 43; d.) TTE 04/24/2022: RVSP 80.6 . * : Puncture wound of right hip * : 04/02/2018 . * : PVD (peripheral vascular disease) with claudication  (HCC) * : * : a.) s/p PTA 03/02/2018 - balloon angioplasty LEFT below knee popliteal artery; b.) s/p PTA 09/30/2022: baloon angioplasty LEFT tibioperoneal trunck, most proximal peroneal artery, and LEFT popliteal artery. . * : Regional wall motion abnormality of heart * : 05/07/2019 . * : Right groin pain * : 02/18/2020 . * : S/P CABG x 4 * : 2003 . * : Screening for colon cancer * : 02/18/2018 . * : Sepsis (HCC) * : 11/15/2023 . * : Sleep apnea * : 07/26/2015 * : a.) does not require nocturnal PAP therapy . * : Subacute osteomyelitis of left foot (HCC) * : 03/25/2018 . * : Thrombocytopenia (HCC) * : . * : Type 2 diabetes mellitus with hyperlipidemia (HCC) * : 07/26/2015 . * : Type 2 diabetes mellitus with stage 4 chronic kidney disease, with long-term current use of insulin  (HCC) * : 07/26/2015 . * : Type II diabetes mellitus with neurological manifestations (HCC) * : 02/18/2020 . * : Vitamin B12 deficiency * : 06/16/2019 . * : Vitamin D deficiency * : 05/07/2018 Past Surgical History: Procedure * : Laterality * : Date . * : AMPUTATION TOE * : Left * : 03/13/2018 * : Procedure: AMPUTATION TOE-MPJ; Surgeon: Ashley Soulier, DPM; Location: [facility] ORS; Service: Podiatry; Laterality: Left; . * : ANGIOPLASTY * : * : . * : AV FISTULA PLACEMENT * : Left * : 08/20/2023 * : Procedure: ARTERIOVENOUS (AV) FISTULA CREATION; Surgeon: Gretta Lonni PARAS, MD; Location: [facility] OR; Service: Vascular; Laterality: Left; . * : CARDIAC CATHETERIZATION * : * : . * : CORONARY ARTERY BYPASS GRAFT * : N/A * : 2003 . * : IR FLUORO GUIDE CV LINE RIGHT * : * : 08/18/2023 . * : IR PERC CHOLECYSTOSTOMY * : * : 11/12/2023 . * : IR US  GUIDE VASC ACCESS RIGHT * : * : 08/18/2023 . * : LAPAROSCOPIC APPENDECTOMY * : N/A * : 09/08/2018 * : Procedure: APPENDECTOMY LAPAROSCOPIC; Surgeon: Jordis Laneta FALCON, MD; Location: [facility] ORS; Service: General; Laterality: N/A; . * : LOWER EXTREMITY ANGIOGRAPHY * : Left * : 03/02/2018 * : Procedure: LOWER EXTREMITY  ANGIOGRAPHY; Surgeon: Marea Selinda RAMAN, MD; Location: [facility]; Service: Cardiovascular; Laterality: Left; . * : LOWER EXTREMITY ANGIOGRAPHY * : Left * : 09/30/2022 * : Procedure: Lower Extremity Angiography; Surgeon: Marea Selinda RAMAN, MD; Location: [facility]; Service: Cardiovascular; Laterality: Left; . * : LOWER EXTREMITY ANGIOGRAPHY * : Left * : 12/26/2023 * : Procedure: Lower Extremity Angiography; Surgeon: Marea Selinda RAMAN, MD; Location: [facility]; Service: Cardiovascular; Laterality: Left; . * : RIGHT/LEFT HEART CATH AND CORONARY/GRAFT ANGIOGRAPHY * : N/A * : 08/01/2023 * : Procedure: RIGHT/LEFT HEART CATH AND CORONARY/GRAFT ANGIOGRAPHY; Surgeon: Wendel Lurena POUR, MD; Location: [facility] INVASIVE  CV LAB; Service: Cardiovascular; Laterality: N/A; . * : ROTATOR CUFF REPAIR * : Left * : Allergies:Patient has no known allergies.Medications: Prior to Admission medications Medication * : Sig * : Start Date * : End Date * : Taking? * : Authorizing Provider acetaminophen  (TYLENOL ) 500 MG tablet * : Take 1,000 mg by mouth every 6 (six) hours as needed for moderate pain or headache. * : * : * : * : [provider] aspirin  EC 81 MG tablet * : Take 1 tablet (81 mg total) by mouth daily. Swallow whole. * : 07/25/23 * : * : * : Thukkani, Arun K, MD atorvastatin  (LIPITOR) 20 MG tablet * : Take 1 tablet (20 mg total) by mouth daily. * : 07/25/23 * : * : * : Thukkani, Arun K, MD carvedilol  (COREG ) 3.125 MG tablet * : Take 1 tablet (3.125 mg total) by mouth 2 (two) times daily. * : 05/20/23 * : 05/02/24 * : * : Carlin Delon BROCKS, NP clopidogrel  (PLAVIX ) 75 MG tablet * : Take 1 tablet (75 mg total) by mouth daily. * : 12/26/23 * : * : * : Dew, Selinda RAMAN, MD gabapentin  (NEURONTIN ) 300 MG capsule * : Take 1 capsule (300 mg total) by mouth at bedtime.Patient taking differently: Take 300 mg by mouth 2 (two) times daily. * : 08/25/23 * : * : * : Arrien, Elidia Sieving, MD insulin  glargine (LANTUS ) 100 UNIT/ML  injection * : Inject 7 Units into the skin daily as needed (Diabetes). Inject 7 units at bedtime as needed after checking glucose * : * : * : * : [provider] isosorbide  mononitrate (IMDUR ) 120 MG 24 hr tablet * : Take 1 tablet (120 mg total) by mouth daily. * : 08/27/23 * : * : * : Revankar, Jennifer SAUNDERS, MD latanoprost  (XALATAN ) 0.005 % ophthalmic solution * : Place 1 drop into both eyes at bedtime. * : 04/15/23 * : * : * : [provider] levothyroxine  (SYNTHROID ) 50 MCG tablet * : Take 50 mcg by mouth daily. * : 04/05/23 * : * : * : [provider] nitroGLYCERIN  (NITROSTAT ) 0.4 MG SL tablet * : Place 1 tablet (0.4 mg total) under the tongue every 5 (five) minutes as needed for chest pain. * : 11/20/20 * : * : * : Revankar, Jennifer SAUNDERS, MD sodium chloride  flush (NS) 0.9 % SOLN * : Inject 5 mLs into the vein daily for 15 days. flush drain daily with 5 cc NS, record output daily, dressing changes every 2-3 days or earlier if soiled * : 11/14/23 * : 12/15/23 * : * : Darci Pore, MD tamsulosin  (FLOMAX ) 0.4 MG CAPS capsule * : Take 0.4 mg by mouth daily after breakfast. * : 04/19/20 * : * : * : [provider] vitamin B-12 (CYANOCOBALAMIN ) 500 MCG tablet * : Take 500 mcg by mouth daily. * : * : * : * : [provider] Vitamin D, Ergocalciferol, (DRISDOL) 1.25 MG (50000 UNIT) CAPS capsule * : Take 50,000 Units by mouth once a week. Mondays * : 07/13/21 * : * : * : [provider] Family History Problem * : Relation * : Age of Onset . * : Hyperlipidemia * : Mother * : . * : Hypertension * : Mother * : . * : Diabetes * : Mother * : . * : Heart attack * : Brother * : . * : Heart disease * : Brother * : . * :  Lung disease * : Father * : Socioeconomic History . * : Marital status: * : Married * : * : Spouse name: * : Not on file . * : Number of children: * : Not on file . * : Years of education: * : Not on file . * : Highest education level: * : Not on file Occupational  History . * : Not on file Tobacco Use . * : Smoking status: * : Former * : * : Current packs/day: * : 0.00 * : * : Average packs/day: * : 0.5 packs/day for 25.0 years (12.5 ttl pk-yrs) * : * : Types: * : Cigarettes * : * : Start date: * : 33 * : * : Quit date: * : 2003 * : * : Years since quitting: * : 22.5 . * : Smokeless tobacco: * : Never Vaping Use . * : Vaping status: * : Never Used Substance and Sexual Activity . * : Alcohol use: * : Not Currently * : * : Comment: 2003 . * : Drug use: * : Never . * : Sexual activity: * : Not on file Other Topics * : Concern . * : Not on file Social History Narrative . * : Not on file Financial Resource Strain: Low Risk  (09/26/2020) * : Received from [facility] * : Overall Financial Resource Strain (CARDIA) * : . * : Difficulty of Paying Living Expenses: Not hard at all Food Insecurity: No Food Insecurity (11/12/2023) * : Hunger Vital Sign * : . * : Worried About Running Out of Food in the Last Year: Never true * : . * : Ran Out of Food in the Last Year: Never true Transportation Needs: No Transportation Needs (11/12/2023) * : PRAPARE - Transportation * : . * : Lack of Transportation (Medical): No * : . * : Lack of Transportation (Non-Medical): No Physical Activity: Inactive (09/26/2020) * : [facility] from [facility] * : Exercise Vital Sign * : . * : On average, how many days per week do you engage in moderate to strenuous exercise (like a brisk walk)?: 0 days * : . * : On average, how many minutes do you engage in exercise at this level?: 0 min Stress: No Stress Concern Present (09/26/2020) * : Received from [facility] * : [facility] - Occupational Stress Questionnaire * : . * : Feeling of Stress : Not at all Social Connections: Moderately Integrated (11/12/2023) * : Social Connection and Isolation Panel * : . * : Frequency of Communication with Friends and Family: Three times a week * : . * : Frequency of Social Gatherings with Friends  and Family: Once a week * : . * : Attends Religious Services: 1 to 4 times per year * : . * : Active Member of Clubs or Organizations: No * : . * : Attends Club or Organization Meetings: Never * : . * : Marital Status: Married Review of Systems: A 12 point ROS discussed and pertinent positives are indicated in the HPI above. All other systems are negative.Physical ExamConstitutional: Appearance: Normal appearance. Abdominal: General: Abdomen is flat. Palpations: Abdomen is soft. Comments: RUQ drain appropriately dressed. Dressing is clean, dry, intact.Drain incision site non-tender, without evidence of infection. Retaining suture and Stat Lock in place. Good bilious output in collection bag. Neurological: Mental Status: He is alert. Imaging: PERIPHERAL VASCULAR CATHETERIZATIONResult Date: 7/25/2025See surgical note for result. VAS US  ABI WITH/WO TBIResult  Date: 12/23/2023 LOWER EXTREMITY DOPPLER STUDY Patient Name: Phillip [name] Date of Exam: 12/22/2023 Medical Rec #: [id number] Accession #: [id number] Date of Birth: 1955/07/12 Patient Gender: M Patient Age: 77 years Exam Location: [facility] Procedure: VAS US  ABI WITH/WO TBI Referring Phys: -------------------------------------------------------------------------------- Indications: Peripheral artery disease. Vascular Interventions: 09/30/2022 Percutaneous transluminal angioplasty of left tibioperoneal trunk and most proximal peroneal artery with 4 mm diameter by 6 cm length Lutonix drug-coated angioplasty balloon 5. Percutaneous transluminal angioplasty of left popliteal artery with 6 mm diameter by 8 cm length Lutonix drug-coated angioplasty balloon. Comparison Study: 09/2022 Performing Technologist: Jerel Croak RVT Examination Guidelines: A complete evaluation includes at minimum, Doppler waveform signals and systolic blood pressure reading at the level of bilateral brachial, anterior tibial, and posterior tibial arteries, when vessel segments are  accessible. Bilateral testing is considered an integral part of a complete examination. Photoelectric Plethysmograph (PPG) waveforms and toe systolic pressure readings are included as required and additional duplex testing as needed. Limited examinations for reoccurring indications may be performed as noted. ABI Findings: +---------+------------------+-----+----------+--------+ Right Rt Pressure (mmHg)IndexWaveform Comment  +---------+------------------+-----+----------+--------+ Brachial 118     +---------+------------------+-----+----------+--------+ ATA   monophasicNonComp  +---------+------------------+-----+----------+--------+ PTA   monophasicNonComp  +---------+------------------+-----+----------+--------+ Great Toe50 0.42 Abnormal   +---------+------------------+-----+----------+--------+ +---------+------------------+-----+--------+-------+ Left Lt Pressure (mmHg)IndexWaveformComment +---------+------------------+-----+--------+-------+ Brachial 120     +---------+------------------+-----+--------+-------+ ATA   biphasicNonComp +---------+------------------+-----+--------+-------+ PTA   biphasicNonComp +---------+------------------+-----+--------+-------+ Great Toe82 0.68 Abnormal  +---------+------------------+-----+--------+-------+ +-------+-----------+-------------+------------+------------+ ABI/TBIToday's ABIToday's TBI Previous ABIPrevious TBI +-------+-----------+-------------+------------+------------+ Right NonComp .42 NonComp .50  +-------+-----------+-------------+------------+------------+ Left NonComp .68 (2nd toe)NonComp Amp  +-------+-----------+-------------+------------+------------+ Bilateral ABIs appear essentially unchanged compared to prior study on 09/2022. Summary: Right: Resting right ankle-brachial index indicates noncompressible right lower extremity arteries. The right toe-brachial index is abnormal.  Left: Resting left ankle-brachial index indicates noncompressible left lower extremity arteries. The left toe-brachial index is abnormal. Great toe amp; 2nd toe near normal TBI. *See table(s) above for measurements and observations. Electronically signed by Cordella Shawl MD on 12/23/2023 at 7:24:13 AM. Final Labs:CBC: Recent Labs 06/10/25193006/11/25063106/12/25065206/13/250552 WBC12.1*8.16.36.8HGB12.8*10.8*11.6*11.4*HCT38.8*32.9*36.8*35.1*PLT81*76*85*95* COAGS: Recent Labs 03/16/25022806/10/25193006/11/251011 INR1.4*1.4*1.3* BMP: Recent Labs 06/10/25193006/11/25063106/12/25065206/13/250552 WJ865*865*864864 K3.83.74.74.1CL94*96*94*95*CO225242225 GLUCOSE148*85175*112*BUN31*35*61*35*CALCIUM8.1*7.8*8.0*8.4*CREATININE4.93*5.52*7.54*5.48*GFRNONAA12*11*7*11* LIVER FUNCTION TESTS: Recent Labs 03/24/25031606/10/25193006/11/25063106/12/250821 BILITOT -- 4.1*4.8*2.4*AST -- 54*49*187*ALT -- 51*4297*ALKPHOS -- 841*865*876EMNU -- 8.07.08.1ALBUMIN3.63.2*2.7*2.7* TUMOR MARKERS: No results for input(s): AFPTM, CEA, CA199, CHROMGRNA in the last 8760 hours. Assessment and Plan:Acute cholecystitis s/p percutaneous cholecystostomy drain placement on 6/11. *Patient is well overall, with expected discomfort from drain. *Good daily bilious output into drainage bag. *Patient may flush drain every 3 days, or sooner with concerns of decreased output or clogged drain. *Patient is currently undergoing extensive cardiac evaluation and is pending TAVR procedure. He will have to obtain clearance by his cardiac team prior to undergoing potential cholecystectomy. *Keep follow-up appointment with Surgery service on 8/11. *IR will see patient in clinic every 6-8 weeks for regular drain exchanges until he is cleared for cholecystectomy, or alternate plan by care team. *Drain was exchanged today by Dr. Johann. Return in 6-8 weeks, on a recurring basis, for drain exchanges. *Return to clinic precautions given to patient and his wife. *Both patient  and his wife voiced understanding and are amenable to this plan. Electronically Signed:Charles A Carim PA-C7/30/2025, 5:56 PM Electronically Signed   By: JONETTA Johann M.D.   On: 01/01/2024 11:10   IR Catheter Tube Change Result Date: 01/01/2024 INDICATION: Acute calculus cholecystitis, status post cholecystostomy catheter placement 11/12/2023, presents for scheduled exchange. No complicating features. EXAM: PERCUTANEOUS CHOLECYSTOSTOMY CATHETER EXCHANGE UNDER FLUOROSCOPY MEDICATIONS: No periprocedural antibiotics were indicated ANESTHESIA/SEDATION: Lidocaine  1% subcutaneous FLUOROSCOPY: Radiation Exposure Index (  as provided by the fluoroscopic device): 22 mGy Kerma COMPLICATIONS: None immediate. PROCEDURE: Informed written consent was obtained from the patient after a thorough discussion of the procedural risks, benefits and alternatives. All questions were addressed. Maximal Sterile Barrier Technique was utilized including caps, mask, sterile gowns, sterile gloves, sterile drape, hand hygiene and skin antiseptic. A timeout was performed prior to the initiation of the procedure. Small contrast injection through the existing catheter confirmed placement within the gallbladder lumen. Catheter cut and exchanged over an angled Glidewire. A new 10 French per pigtail catheter was advanced and positioned centrally in the gallbladder. Contrast injection confirms good positioning. Catheter secured externally 0 Prolene suture and placed to external drain bag. The patient tolerated the procedure well. IMPRESSION: 1. Technically successful exchange of cholecystostomy catheter under fluoroscopy. Electronically Signed   By: JONETTA Faes M.D.   On: 01/01/2024 11:08   PERIPHERAL VASCULAR CATHETERIZATION Result Date: 12/26/2023 See surgical note for result.   There are no new results to review at this time.  Previous records (including but not limited to H&P, progress notes, nursing notes, TOC management) were reviewed in  assessment of this patient.  Labs: CBC: Recent Labs  Lab 01/19/24 0442 01/20/24 0417 01/20/24 1752 01/21/24 0331 01/22/24 0347 01/23/24 0418  WBC 11.1* 10.7*  --  11.0* 12.6* 13.3*  HGB 10.7* 11.6* 11.6* 11.2* 9.8* 10.6*  HCT 32.9* 36.1* 34.0* 35.0* 30.1* 32.9*  MCV 94.0 95.3  --  94.9 94.1 93.5  PLT 225 221  --  197 169 178   Basic Metabolic Panel: Recent Labs  Lab 01/19/24 0442 01/19/24 1844 01/20/24 0417 01/20/24 1752 01/21/24 0331 01/22/24 0347 01/23/24 0418  NA 133* 134* 134* 124* 131* 132* 133*  K 4.2 4.5 4.4 4.2 4.8 5.3* 4.8  CL 93* 92* 93* 86* 92* 90* 92*  CO2 25 26 24   --  22 21* 23  GLUCOSE 129* 105* 105* 116* 109* 120* 129*  BUN 43* 49* 55* 58* 72* 96* 61*  CREATININE 7.16* 8.20* 8.78* 8.40* 10.74* 12.08* 8.48*  CALCIUM  8.7* 9.0 9.1  --  8.5* 8.2* 9.1  MG 2.1  --   --   --  2.2  --   --    Liver Function Tests: Recent Labs  Lab 01/19/24 1844  AST 22  ALT <5  ALKPHOS 161*  BILITOT 0.8  PROT 9.4*  ALBUMIN  2.5*   CBG: Recent Labs  Lab 01/22/24 1357 01/22/24 1723 01/22/24 2112 01/23/24 0609 01/23/24 1101  GLUCAP 140* 159* 112* 127* 115*    Scheduled Meds:  (feeding supplement) PROSource Plus  30 mL Oral BID BM   aspirin  EC  81 mg Oral Daily   atorvastatin   20 mg Oral Daily   carvedilol   3.125 mg Oral BID   Chlorhexidine  Gluconate Cloth  6 each Topical Q0600   [START ON 01/24/2024] Chlorhexidine  Gluconate Cloth  6 each Topical Q0600   clopidogrel   75 mg Oral Daily   heparin   5,000 Units Subcutaneous Q8H   insulin  aspart  0-6 Units Subcutaneous TID WC   isosorbide  mononitrate  120 mg Oral Daily   levothyroxine   50 mcg Oral Daily   sevelamer  carbonate  800 mg Oral TID WC   sodium chloride  flush  3 mL Intravenous Q12H   tamsulosin   0.4 mg Oral QPC breakfast   Continuous Infusions:  sodium chloride      norepinephrine  (LEVOPHED ) Adult infusion     PRN Meds:.sodium chloride , acetaminophen  **OR** acetaminophen , albuterol , HYDROmorphone   (DILAUDID ) injection,  menthol -cetylpyridinium, nitroGLYCERIN , oxyCODONE , polyethylene glycol, sodium chloride  flush  Family Communication: None at bedside  Disposition: Status is: Inpatient Remains inpatient appropriate because: TMA, ESRD     Time spent: 36 minutes  Length of inpatient stay: 9 days  Author: Carliss LELON Canales, DO 01/23/2024 1:14 PM  For on call review [email or web address].

## 2024-01-23 NOTE — Progress Notes (Signed)
 Phillip [name] Progress Note   Subjective:  Patient seen and examined in his room this morning. Patient had HD yesterday and reports that it went well. Minimal UF with HD yesterday. Possible d/c to CIR later today? BP okay, but a bit softer today. He denies any dyspnea or CP. Next HD 01/24/24  Objective Vitals:   01/23/24 0308 01/23/24 0735 01/23/24 0829 01/23/24 0900  BP: (!) 109/59 92/64 (!) 90/56 108/65  Pulse: 90 100 94   Resp: 20 20 17    Temp: 98.3 F (36.8 C) 97.6 F (36.4 C)    TempSrc: Oral Oral    SpO2: 91% 99% 100%   Weight: 74.9 kg     Height:       Physical Exam General: Alert, sitting up in bed, nad  Heart: RRR Lungs: Clear  Abdomen: soft, NTND Extremities: no LE edema, L TMA w/wound vac in place Dialysis Access: LU AVF +b/t   Filed Weights   01/22/24 0810 01/22/24 1230 01/23/24 0308  Weight: 74.8 kg 74 kg 74.9 kg    Intake/Output Summary (Last 24 hours) at 01/23/2024 1133 Last data filed at 01/23/2024 0400 Gross per 24 hour  Intake 30 ml  Output 950 ml  Net -920 ml    Additional Objective Labs: Basic Metabolic Panel: Recent Labs  Lab 01/21/24 0331 01/22/24 0347 01/23/24 0418  NA 131* 132* 133*  K 4.8 5.3* 4.8  CL 92* 90* 92*  CO2 22 21* 23  GLUCOSE 109* 120* 129*  BUN 72* 96* 61*  CREATININE 10.74* 12.08* 8.48*  CALCIUM  8.5* 8.2* 9.1   Liver Function Tests: Recent Labs  Lab 01/19/24 1844  AST 22  ALT <5  ALKPHOS 161*  BILITOT 0.8  PROT 9.4*  ALBUMIN  2.5*   CBC: Recent Labs  Lab 01/19/24 0442 01/20/24 0417 01/20/24 1752 01/21/24 0331 01/22/24 0347 01/23/24 0418  WBC 11.1* 10.7*  --  11.0* 12.6* 13.3*  HGB 10.7* 11.6*   < > 11.2* 9.8* 10.6*  HCT 32.9* 36.1*   < > 35.0* 30.1* 32.9*  MCV 94.0 95.3  --  94.9 94.1 93.5  PLT 225 221  --  197 169 178   < > = values in this interval not displayed.   CBG: Recent Labs  Lab 01/22/24 1357 01/22/24 1723 01/22/24 2112 01/23/24 0609 01/23/24 1101  GLUCAP 140* 159*  112* 127* 115*    Medications:  sodium chloride      norepinephrine  (LEVOPHED ) Adult infusion      (feeding supplement) PROSource Plus  30 mL Oral BID BM   aspirin  EC  81 mg Oral Daily   atorvastatin   20 mg Oral Daily   carvedilol   3.125 mg Oral BID   Chlorhexidine  Gluconate Cloth  6 each Topical Q0600   clopidogrel   75 mg Oral Daily   heparin   5,000 Units Subcutaneous Q8H   insulin  aspart  0-6 Units Subcutaneous TID WC   isosorbide  mononitrate  120 mg Oral Daily   levothyroxine   50 mcg Oral Daily   sevelamer  carbonate  800 mg Oral TID WC   sodium chloride  flush  3 mL Intravenous Q12H   tamsulosin   0.4 mg Oral QPC breakfast    Dialysis Orders: [facility] - No heparin  - Left AVF (in use for 2 weeks) - EDW 81 kg - will need EDW lowered at d/c 4 hr 350/800 2K/2.5 Ca Bicarb 39 Mircera: 50 mcg ordered - but has not received any ESA yet No VDRA Renvela  800 mg  TID WC   Assessment/Plan: PAD/ Left third toe gangrene: 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 Then TMA completed on 01/17/24 by Dr. Serene.  Severe Aortic Stenosis: TAVR 01/20/24  ESRD:  Usual HD TTS. Had HD off schedule Monday to accommodate for surgery on Tues. Resume regular schedule on Thursday. Next HD 01/24/24.  Hypertension/volume: BP in goal. On carvedilol  3.125mg  BID - hold in AM on HD days.  Use albumin  with HD for BP support. UF as able.  Chronic HFrEF: Continue volume removal with HD.  Recent acute cholecystitis requiring cholecystostomy drain 6/25: drain is in place.  Anemia: Hgb 10-11.  No indication for ESA at this time.  Metabolic bone disease: restarted home Sevelamer  800 TID WC. Corrected calcium  10.3. Check phos. Not on VDRA at his OP clinic. Continue home binder DMT2: Insulin  per primary.  Nutrition:  Albumin  low. Add supp. On renal diet with fluid restriction  Belvie Och, NP [name] 01/23/2024,11:33 AM

## 2024-01-24 DIAGNOSIS — N186 End stage renal disease: Secondary | ICD-10-CM | POA: Diagnosis not present

## 2024-01-24 DIAGNOSIS — Z952 Presence of prosthetic heart valve: Secondary | ICD-10-CM | POA: Diagnosis not present

## 2024-01-24 DIAGNOSIS — I5042 Chronic combined systolic (congestive) and diastolic (congestive) heart failure: Secondary | ICD-10-CM | POA: Diagnosis not present

## 2024-01-24 DIAGNOSIS — E872 Acidosis, unspecified: Secondary | ICD-10-CM | POA: Diagnosis not present

## 2024-01-24 DIAGNOSIS — E1152 Type 2 diabetes mellitus with diabetic peripheral angiopathy with gangrene: Secondary | ICD-10-CM | POA: Diagnosis not present

## 2024-01-24 DIAGNOSIS — I2510 Atherosclerotic heart disease of native coronary artery without angina pectoris: Secondary | ICD-10-CM | POA: Diagnosis not present

## 2024-01-24 DIAGNOSIS — K810 Acute cholecystitis: Secondary | ICD-10-CM | POA: Diagnosis not present

## 2024-01-24 DIAGNOSIS — D638 Anemia in other chronic diseases classified elsewhere: Secondary | ICD-10-CM | POA: Diagnosis not present

## 2024-01-24 LAB — CBC
HCT: 33.7 % — ABNORMAL LOW (ref 39.0–52.0)
Hemoglobin: 10.8 g/dL — ABNORMAL LOW (ref 13.0–17.0)
MCH: 30.1 pg (ref 26.0–34.0)
MCHC: 32 g/dL (ref 30.0–36.0)
MCV: 93.9 fL (ref 80.0–100.0)
Platelets: 178 K/uL (ref 150–400)
RBC: 3.59 MIL/uL — ABNORMAL LOW (ref 4.22–5.81)
RDW: 14.9 % (ref 11.5–15.5)
WBC: 12.2 K/uL — ABNORMAL HIGH (ref 4.0–10.5)
nRBC: 0 % (ref 0.0–0.2)

## 2024-01-24 LAB — GLUCOSE, CAPILLARY
Glucose-Capillary: 123 mg/dL — ABNORMAL HIGH (ref 70–99)
Glucose-Capillary: 102 mg/dL — ABNORMAL HIGH (ref 70–99)
Glucose-Capillary: 109 mg/dL — ABNORMAL HIGH (ref 70–99)
Glucose-Capillary: 95 mg/dL (ref 70–99)

## 2024-01-24 LAB — BASIC METABOLIC PANEL WITH GFR
Anion gap: 20 — ABNORMAL HIGH (ref 5–15)
BUN: 78 mg/dL — ABNORMAL HIGH (ref 8–23)
CO2: 22 mmol/L (ref 22–32)
Calcium: 8.7 mg/dL — ABNORMAL LOW (ref 8.9–10.3)
Chloride: 92 mmol/L — ABNORMAL LOW (ref 98–111)
Creatinine, Ser: 10.31 mg/dL — ABNORMAL HIGH (ref 0.61–1.24)
GFR, Estimated: 5 mL/min — ABNORMAL LOW (ref >60)
Glucose, Bld: 122 mg/dL — ABNORMAL HIGH (ref 70–99)
Potassium: 4.7 mmol/L (ref 3.5–5.1)
Sodium: 134 mmol/L — ABNORMAL LOW (ref 135–145)

## 2024-01-24 MED ORDER — BISACODYL 10 MG RE SUPP: 10.0000 mg | Freq: Every day | RECTAL | Status: DC | PRN

## 2024-01-24 MED ORDER — ALBUMIN HUMAN 25 % IV SOLN
25.0000 g | Freq: Once | INTRAVENOUS | Status: AC
  Administered 2024-01-24: 25 g via INTRAVENOUS

## 2024-01-24 MED ORDER — BISACODYL 5 MG PO TBEC: 5.0000 mg | DELAYED_RELEASE_TABLET | Freq: Every day | ORAL | Status: DC | PRN

## 2024-01-24 NOTE — Progress Notes (Signed)
 Progress Note   Patient: Phillip [name] FMW:[id number] DOB: 05/15/1956 DOA: 01/14/2024  DOS: the patient was seen and examined on 01/24/2024   Brief hospital course:  67/M w ESRD on HD TTS, DM-1, recent acute cholecystitis June 2025) requiring cholecystostomy tube placement, chronic HFrEF, aortic stenosis (TAVR postponed due to left third toe gangrene), PAD-s/p PCI to left tibia-fibula/angioplasty on 7/25 who was admitted by vascular surgery 8/13 for an elective left third toe amputation. H/o significant issues with nonhealing ulceration of his left foot-progressing into a gangrenous left third toe, underwent a angiogram of LLE with transluminal angioplasty/stent placement to the left tibioperoneal trunk on 7/25 at [facility]. Plans were to proceed with a left third toe amputation post revascularization. However due to his significant underlying medical comorbidities, this could not be done at [facility], referred to the [facility] surgery and admitted to [facility].   8/13 - left 3rd toe amputation 8/15 - left TMA and wound vac placment 8/19 - TAVR 8/22 - wound vac removed   Assessment and Plan:   Left third toe gangrene - S/p transmetatarsal amputation with wound VAC placement 8/15.  Wound VAC removed 8/22.  Leukocytosis showing slight improvement today.  Severe aortic stenosis - S/p TAVR 8/19.  Cardiology following closely.  Radiology placed Zio patch 8/22 given new LBBB.   Chronic HFrEF - Appears euvolemic at this time.  Volume removal per HD/UF.   ESRD - HD TTS.  Nephrology following closely.  LUE AVF.   CAD with CABG - Last cath 2/25 noting severe CAD with all vein grafts occluded.  Continue aspirin , Plavix , statin.   Recent history of acute cholecystitis requiring cholecystostomy drain-11/2023 - Percutaneous cholecystectomy drain in place.     Diabetes mellitus - A1c 5.8 suggesting excellent control.  Insulin  sliding scale more.   Normocytic anemia - Likely anemia of chronic kidney  disease.  Hemoglobin stable.   Hypothyroidism - Synthroid  on board.   Goals of care - Currently being evaluated for inpatient rehab/CIR.  Working closely with TOC on disposition planning.  Anticipate discharge Monday.   Subjective: Patient resting comfortably this morning.  Denies any fever, chills, chest pain, nausea, vomiting abdominal pain.  Wound VAC removed this morning.  No purulence.  Physical Exam:  Vitals:   01/24/24 1030 01/24/24 1100 01/24/24 1130 01/24/24 1200  BP: 94/62 94/62 94/83  (!) 87/64  Pulse: 74 79 85 93  Resp:   13 15  Temp:      TempSrc:      SpO2: 99%  100% 97%  Weight:      Height:        GENERAL:  Alert, pleasant, no acute distress  HEENT:  EOMI CARDIOVASCULAR:  RRR, no murmurs appreciated RESPIRATORY:  Clear to auscultation, no wheezing, rales, or rhonchi GASTROINTESTINAL:  Soft, nontender, nondistended EXTREMITIES:  No LE edema bilaterally NEURO:  No new focal deficits appreciated SKIN: LLE TMA, no purulence, dusky distal incision PSYCH:  Appropriate mood and affect    Data Reviewed:  Imaging Studies: ECHOCARDIOGRAM COMPLETE Result Date: 01/21/2024    ECHOCARDIOGRAM REPORT   Patient Name:   Phillip [name] Date of Exam: 01/21/2024 Medical Rec #:  [id number]        Height:       72.0 in Accession #:    [id number]       Weight:       165.6 lb Date of Birth:  12-21-1955       BSA:  1.966 m Patient Age:    67 years         BP:           128/75 mmHg Patient Gender: M                HR:           77 bpm. Exam Location:  Inpatient Procedure: 2D Echo, Cardiac Doppler and Color Doppler (Both Spectral and Color            Flow Doppler were utilized during procedure). Indications:    Post TAVR evaluation. ; I35.0 Nonrheumatic aortic (valve)                 stenosis  History:        Patient has prior history of Echocardiogram examinations, most                 recent 01/20/2024. CHF, CAD, Prior CABG, Pulmonary HTN, Aortic                 Valve Disease,  Signs/Symptoms:Edema; Risk Factors:Hypertension                 and Dyslipidemia. ESRD.                 Aortic Valve: 29 mm Edwards Sapien prosthetic, stented (TAVR)                 valve is present in the aortic position. Procedure Date:                 01/20/2024.  Sonographer:    Ellouise Mose RDCS Referring Phys: [id number] [facility] R THOMPSON  Sonographer Comments: Technically difficult study due to poor echo windows. IMPRESSIONS  1. Left ventricular ejection fraction, by estimation, is 30 to 35%. The left ventricle has moderately decreased function. The left ventricle demonstrates regional wall motion abnormalities (see scoring diagram/findings for description). The left ventricular internal cavity size was moderately dilated. There is mild concentric left ventricular hypertrophy. Left ventricular diastolic parameters are consistent with Grade I diastolic dysfunction (impaired relaxation).  2. The mitral valve is degenerative. Mild to moderate mitral valve regurgitation. No evidence of mitral stenosis. Moderate mitral annular calcification.  3. The aortic valve has been repaired/replaced. There is a 29 mm Edwards Sapien prosthetic (TAVR) valve present in the aortic position. Procedure Date: 01/20/2024. Echo findings are consistent with trivial perivalvular leak of the aortic prosthesis.  4. Right ventricular systolic function is moderately reduced. The right ventricular size is normal.  5. Left atrial size was mildly dilated. FINDINGS  Left Ventricle: Left ventricular ejection fraction, by estimation, is 30 to 35%. The left ventricle has moderately decreased function. The left ventricle demonstrates regional wall motion abnormalities. The left ventricular internal cavity size was moderately dilated. There is mild concentric left ventricular hypertrophy. Left ventricular diastolic parameters are consistent with Grade I diastolic dysfunction (impaired relaxation).  LV Wall Scoring: The inferior wall and posterior wall are  akinetic. Right Ventricle: The right ventricular size is normal. No increase in right ventricular wall thickness. Right ventricular systolic function is moderately reduced. Left Atrium: Left atrial size was mildly dilated. Right Atrium: Right atrial size was normal in size. Pericardium: There is no evidence of pericardial effusion. Mitral Valve: The mitral valve is degenerative in appearance. Moderate mitral annular calcification. Mild to moderate mitral valve regurgitation. No evidence of mitral valve stenosis. MV peak gradient, 7.8 mmHg. The mean mitral valve gradient is 3.0 mmHg. Tricuspid  Valve: The tricuspid valve is normal in structure. Tricuspid valve regurgitation is trivial. No evidence of tricuspid stenosis. Aortic Valve: Trivial perivalvular leak and 2 o'clock. The aortic valve has been repaired/replaced. Aortic valve regurgitation is not visualized. No aortic stenosis is present. Aortic valve mean gradient measures 5.0 mmHg. Aortic valve peak gradient measures 10.2 mmHg. Aortic valve area, by VTI measures 4.55 cm. There is a 29 mm Edwards Sapien prosthetic, stented (TAVR) valve present in the aortic position. Procedure Date: 01/20/2024. Echo findings are consistent with normal structure and function of the aortic valve prosthesis. Pulmonic Valve: The pulmonic valve was normal in structure. Pulmonic valve regurgitation is not visualized. No evidence of pulmonic stenosis. Aorta: The aortic root and ascending aorta are structurally normal, with no evidence of dilitation. IAS/Shunts: There is redundancy of the interatrial septum. No atrial level shunt detected by color flow Doppler.  LEFT VENTRICLE PLAX 2D LVIDd:         5.18 cm      Diastology LVIDs:         4.80 cm      LV e' medial:    3.92 cm/s LV PW:         1.28 cm      LV E/e' medial:  22.8 LV IVS:        1.39 cm      LV e' lateral:   5.55 cm/s LVOT diam:     2.90 cm      LV E/e' lateral: 16.1 LV SV:         124 LV SV Index:   63 LVOT Area:     6.61  cm  LV Volumes (MOD) LV vol d, MOD A2C: 277.0 ml LV vol d, MOD A4C: 198.0 ml LV vol s, MOD A2C: 206.0 ml LV vol s, MOD A4C: 138.0 ml LV SV MOD A2C:     71.0 ml LV SV MOD A4C:     198.0 ml LV SV MOD BP:      71.8 ml RIGHT VENTRICLE            IVC RV S prime:     7.40 cm/s  IVC diam: 1.70 cm TAPSE (M-mode): 0.7 cm LEFT ATRIUM             Index        RIGHT ATRIUM           Index LA diam:        3.96 cm 2.01 cm/m   RA Area:     16.20 cm LA Vol (A2C):   38.2 ml 19.43 ml/m  RA Volume:   46.30 ml  23.55 ml/m LA Vol (A4C):   28.2 ml 14.34 ml/m LA Biplane Vol: 32.4 ml 16.48 ml/m  AORTIC VALVE AV Area (Vmax):    5.08 cm AV Area (Vmean):   4.73 cm AV Area (VTI):     4.55 cm AV Vmax:           160.00 cm/s AV Vmean:          100.467 cm/s AV VTI:            0.273 m AV Peak Grad:      10.2 mmHg AV Mean Grad:      5.0 mmHg LVOT Vmax:         123.00 cm/s LVOT Vmean:        71.900 cm/s LVOT VTI:          0.188 m LVOT/AV VTI ratio:  0.69  AORTA Ao Root diam: 3.51 cm Ao Asc diam:  3.59 cm MITRAL VALVE MV Area (PHT): 4.15 cm     SHUNTS MV Area VTI:   3.42 cm     Systemic VTI:  0.19 m MV Peak grad:  7.8 mmHg     Systemic Diam: 2.90 cm MV Mean grad:  3.0 mmHg MV Vmax:       1.40 m/s MV Vmean:      88.5 cm/s MV Decel Time: 183 msec MV E velocity: 89.40 cm/s MV A velocity: 127.00 cm/s MV E/A ratio:  0.70 Toribio Fuel MD Electronically signed by Toribio Fuel MD Signature Date/Time: 01/21/2024/12:14:45 PM    Final    ECHOCARDIOGRAM LIMITED Result Date: 01/20/2024    ECHOCARDIOGRAM LIMITED REPORT   Patient Name:   Phillip [name] Date of Exam: 01/20/2024 Medical Rec #:  [id number]        Height:       72.0 in Accession #:    [id number]       Weight:       165.8 lb Date of Birth:  March 03, 1956       BSA:          1.967 m Patient Age:    67 years         BP:           105/60 mmHg Patient Gender: M                HR:           61 bpm. Exam Location:  Inpatient Procedure: Limited Color Doppler (Both Spectral and Color Flow  Doppler were            utilized during procedure). Indications:     aortic stenosis. TAVR  History:         Patient has prior history of Echocardiogram examinations, most                  recent 08/01/2023. CAD, Prior CABG, end stage renal disease;                  Risk Factors:Hypertension, Dyslipidemia, Diabetes and Sleep                  Apnea.                  Aortic Valve: 29 mm valve is present in the aortic position.                  Procedure Date: 01/20/2024.  Sonographer:     Tinnie Barefoot RDCS Referring Phys:  [id number] [facility] THOMPSON Diagnosing Phys: Darryle Decent MD IMPRESSIONS  1. Echo guided TAVR. Vmax 1.21 m/s, MG 3.0 mmHG, EOA 4.57 cm2. No regurgitation or paravalvular leak. Aortic valve regurgitation is not visualized. There is a 29 mm valve present in the aortic position. Procedure Date: 01/20/2024. Aortic valve area, by VTI measures 4.57 cm. Aortic valve mean gradient measures 3.0 mmHg. Aortic valve Vmax measures 1.21 m/s.  2. Left ventricular ejection fraction, by estimation, is 30 to 35%. The left ventricle has moderately decreased function.  3. Right ventricular systolic function is normal. The right ventricular size is normal.  4. The mitral valve is degenerative. Mild mitral valve regurgitation. No evidence of mitral stenosis. FINDINGS  Left Ventricle: Left ventricular ejection fraction, by estimation, is 30 to 35%. The left ventricle has moderately decreased function.  LV Wall Scoring: The inferior wall and  posterior wall are akinetic. Right Ventricle: The right ventricular size is normal. No increase in right ventricular wall thickness. Right ventricular systolic function is normal. Pericardium: There is no evidence of pericardial effusion. Mitral Valve: The mitral valve is degenerative in appearance. Mild mitral valve regurgitation. No evidence of mitral valve stenosis. Tricuspid Valve: The tricuspid valve is grossly normal. Tricuspid valve regurgitation is mild . No evidence of  tricuspid stenosis. Aortic Valve: Echo guided TAVR. Vmax 1.21 m/s, MG 3.0 mmHG, EOA 4.57 cm2. No regurgitation or paravalvular leak. Aortic valve regurgitation is not visualized. Aortic regurgitation PHT measures 446 msec. Aortic valve mean gradient measures 3.0 mmHg. Aortic valve peak gradient measures 5.9 mmHg. Aortic valve area, by VTI measures 4.57 cm. There is a 29 mm valve present in the aortic position. Procedure Date: 01/20/2024. Aorta: The aortic root and ascending aorta are structurally normal, with no evidence of dilitation. Additional Comments: Spectral Doppler performed. Color Doppler performed.  LEFT VENTRICLE PLAX 2D LVIDd:         5.30 cm LV PW:         1.00 cm LV IVS:        1.20 cm LVOT diam:     2.68 cm LV SV:         105 LV SV Index:   54 LVOT Area:     5.64 cm  LV Volumes (MOD) LV vol d, MOD A4C: 181.0 ml LV vol s, MOD A4C: 117.0 ml LV SV MOD A4C:     181.0 ml LEFT ATRIUM         Index LA diam:    3.70 cm 1.88 cm/m  AORTIC VALVE AV Area (Vmax):    4.90 cm AV Area (Vmean):   4.80 cm AV Area (VTI):     4.57 cm AV Vmax:           121.00 cm/s AV Vmean:          80.400 cm/s AV VTI:            0.231 m AV Peak Grad:      5.9 mmHg AV Mean Grad:      3.0 mmHg LVOT Vmax:         105.00 cm/s LVOT Vmean:        68.400 cm/s LVOT VTI:          0.187 m LVOT/AV VTI ratio: 0.81 AI PHT:            446 msec  AORTA Ao Root diam: 3.40 cm Ao Asc diam:  3.30 cm  SHUNTS Systemic VTI:  0.19 m Systemic Diam: 2.68 cm Darryle Decent MD Electronically signed by Darryle Decent MD Signature Date/Time: 01/20/2024/6:03:09 PM    Final    Structural Heart Procedure Result Date: 01/20/2024 See surgical note for result.  IR Radiologist Eval & Mgmt Result Date: 01/01/2024 : Chief Complaint:Patient was seen in consultation today for acute cholecystitis s/p percutaneous cholecystostomy drain placement,Referring Physician(s):Dr. Dreama Hanger, MD Supervising Physician: Johann, DanielHistory of Present Illness:Madden D Stracener  is a 68 y.o. male with past medical history of ESRD on HD, severe aortic stenosis planning on TAVR soon, HFrEF, NSTEMI 2022, CAD s/p CABG in 2003, GERD, DM2, HTN, thrombocytopenia, and prior lap appendectomy in 2020 who presented to [facility] ED with acute abdominal pain after he was found to be febrile at his dialysis center 6/10. Initially his RUQ US  was nonspecific for acute cholecystitis despite RUQ pain, tachycardia, fever, and elevated lactic acid. However, subsequent MR  Abdomen/MRCP showed a distended gallbladder with innumerable stones, without biliary duct dilation. Due to notable cardiac and ESRD comorbidities, patient was recommended for percutaneous cholecystostomy drain placement, which was placed in IR on 6/11. He improved subsequently, and eventually discharged with multiple follow-up appointments, including with outpatient IR clinic. His planned TAVR pprocedure was postponed in light of his acute cholecystitis.Patient has been seen by cardiology and vascular surgery. His TAVR remains pending. He was informed by his cardiology team that his potential cholecystectomy will have to be deferred pending cardiac evaluation, TAVR procedure, and eventual clearance.Patient presents to IR clinic today for 7 week follow-up. He endorses expected discomfort from his drain, but other is overall well. He is tired since his dialysis session yesterday. He has a necrotic toe, being evaluated, that causes him pain. His wife is at his side. His drain has good bilious output daily. His wife flushes it daily. He denies fevers, chills, abdominal pain, nausea, vomiting. His surgical follow-up appointment is scheduled for 8/11 with Dr. Dasie. Past Medical History: Diagnosis * : Date . * : Abdominopelvic abscess (HCC) * : 09/29/2018 . * : Abscess of appendix * : 09/22/2018 . * : Acquired spondylolisthesis of lumbosacral region * : 03/20/2020 . * : Acute cholecystitis * : 11/15/2023 . * : Acute kidney injury (HCC) * : 10/07/2018 . * :  Acute kidney injury superimposed on chronic kidney disease (HCC) * : 11/06/2020 . * : Acute kidney injury superimposed on CKD (HCC) * : 02/18/2020 . * : Acute on chronic systolic (congestive) heart failure (HCC) * : 07/26/2015 * : Formatting of this note might be different from the original. Formatting of this note might be different from the original. Formatting of this note might be different from the original. revenkar EF 35% Formatting of this note might be different from the original. revenkar EF 35% . * : Acute on chronic systolic CHF (congestive heart failure) (HCC) * : 08/12/2023 . * : Anasarca * : 07/01/2023 . * : Anemia of chronic disease * : 08/18/2019 . * : Anemia, chronic disease * : 08/18/2019 . * : Aortic atherosclerosis (HCC) * : . * : Aortic stenosis * : * : a.) TTE 09/15/2018: mild-mod (MPG 19); b.) TTE 03/21/2021: mild-mod (MPG 14.3); c.) TTE 04/24/2022: mild- mod (MPG 15) . * : Aortic stenosis, moderate * : 10/15/2019 . * : Appendicitis * : 09/08/2018 . * : ARF (acute renal failure) (HCC) * : 02/19/2020 . * : Asthma * : . * : Atherosclerosis of native arteries of the extremities with ulceration (HCC) * : 02/24/2018 * : Formatting of this note might be different from the original. Last Assessment & Plan: His ABIs today are 1.07 on the right and 0.99 on the left with biphasic waveforms. Although these pressures may be somewhat elevated from medial calcification, his flow does appear to be reasonably good. His left ABI was 0.58 prior to intervention. At this point, we will stretch out his follow-up and see hi . * : Benign hypertension with CKD (chronic kidney disease) stage IV (HCC) * : 02/24/2018 . * : Benign prostatic hyperplasia without lower urinary tract symptoms * : 01/14/2018 . * : Bilateral lower extremity edema * : 11/04/2018 . * : Bradycardia * : 03/25/2018 . * : Bruit of right carotid artery * : 07/26/2015 . * : CAD (coronary artery disease) * : 2003 * : a.) s/p 4v CABG 2003 . * : Cardiac  murmur * : 07/26/2015 . * :  Choledocholithiasis * : 11/12/2023 . * : Chronic combined systolic and diastolic CHF (congestive heart failure) (HCC) * : 07/26/2015 * : a.) TTE 09/15/2018: EF 50-55%, mod LVH, RVE, BAE, mild-mod TR, AoV sclerosis, G1DD; b.) MPI 10/27/2019: EF <30%; c.) TTE 03/21/2021: EF 35-40%, post AK, inf HK, mod LVH, mod red RVSF, mod LAE, mod Aov sclerosis, G2DD; c.) TTE 04/24/2022: EF 35-40%, post AK, glok HK, mod LVE, mod red RVSF, mild-mod MR, Aov sclerosis, G3DD . * : Chronic idiopathic constipation * : 11/02/2018 . * : Chronic low back pain without sciatica * : 12/02/2018 . * : Chronic pain of right hip * : 03/31/2020 . * : Chronic systolic heart failure (HCC) * : 07/26/2015 * : Formatting of this note might be different from the original. revenkar EF 35% . * : CKD (chronic kidney disease) stage 4, GFR 15-29 ml/min (HCC) * : 10/07/2018 . * : Congestive heart failure (HCC) * : 07/26/2015 * : Last Assessment & Plan: Poor cardiac function could certainly be contributing to poor blood supply to the feet and toes as well. Formatting of this note might be different from the original. Formatting of this note might be different from the original. Last Assessment & Plan: Poor cardiac function could certainly be contributing to poor blood supply to the feet and toes as well. Last . * : Coronary artery disease * : . * : Diabetes mellitus without complication (HCC) * : . * : Dysphagia * : 07/26/2015 . * : Encounter for long-term (current) use of insulin  (HCC) * : 09/27/2020 . * : Erectile dysfunction * : 07/14/2017 . * : ESRD on dialysis (HCC) * : 11/15/2023 . * : Essential hypertension * : 02/24/2018 . * : Fever * : 11/07/2020 . * : Foot ulcer (HCC) * : 09/27/2020 * : left foot Formatting of this note might be different from the original. Formatting of this note might be different from the original. left foot . * : Foot ulcer, left (HCC) * : . * : GERD (gastroesophageal reflux disease) * : . * : Heart  murmur * : 07/26/2015 . * : Hematuria * : 07/27/2015 . * : High risk medication use * : 07/26/2015 . * : History of marijuana use * : . * : Hyperkalemia * : 05/29/2020 . * : Hyperlipidemia * : 07/26/2015 . * : Hyperlipidemia associated with type 2 diabetes mellitus (HCC) * : 02/18/2020 . * : Hypertension * : . * : Hypertension associated with diabetes (HCC) * : . * : Hypoglycemia * : 05/07/2019 . * : Hypothyroidism * : 11/14/2015 . * : Hypothyroidism (acquired) * : 11/14/2015 . * : Insomnia * : 11/02/2018 . * : Kidney insufficiency * : . * : Lumbar spondylosis * : 03/20/2020 . * : Malaise and fatigue * : 03/25/2018 . * : Malnutrition of moderate degree * : 08/20/2023 . * : Mitral regurgitation * : 05/12/2019 . * : Moderate aortic regurgitation * : 05/12/2019 . * : Myocardial infarction due to demand ischemia [facility]) * : 09/26/2020 * : a.) Type II NSTEMI; b.) troponins were trended 0.54 --> 0.56 --> 0.52 ng/mL . * : Non-compliance * : 05/05/2019 . * : NSTEMI (non-ST elevated myocardial infarction) (HCC) * : 09/27/2020 . * : Osseous and subluxation stenosis of intervertebral foramina of lumbar region * : 03/20/2020 . * : Osteoarthritis * : 10/06/2019 . * : Pancytopenia (HCC) * : 08/12/2023 . * : Peripheral vascular disease (  HCC) * : . * : Personal history of tobacco use, presenting hazards to health * : 09/27/2020 . * : Polyneuropathy in diabetes (HCC) * : 05/07/2019 . * : Primary osteoarthritis involving multiple joints * : 10/06/2019 . * : Primary osteoarthritis of right hip * : 02/21/2020 . * : Pulmonary HTN (HCC) * : * : a.) TTE 05/12/2019: PASP 71; b.) TTE 09/27/2020: PASP >70; c.) TTE 03/21/2021: RVSP 43; d.) TTE 04/24/2022: RVSP 80.6 . * : Puncture wound of right hip * : 04/02/2018 . * : PVD (peripheral vascular disease) with claudication (HCC) * : * : a.) s/p PTA 03/02/2018 - balloon angioplasty LEFT below knee popliteal artery; b.) s/p PTA 09/30/2022: baloon angioplasty LEFT tibioperoneal trunck, most proximal  peroneal artery, and LEFT popliteal artery. . * : Regional wall motion abnormality of heart * : 05/07/2019 . * : Right groin pain * : 02/18/2020 . * : S/P CABG x 4 * : 2003 . * : Screening for colon cancer * : 02/18/2018 . * : Sepsis (HCC) * : 11/15/2023 . * : Sleep apnea * : 07/26/2015 * : a.) does not require nocturnal PAP therapy . * : Subacute osteomyelitis of left foot (HCC) * : 03/25/2018 . * : Thrombocytopenia (HCC) * : . * : Type 2 diabetes mellitus with hyperlipidemia (HCC) * : 07/26/2015 . * : Type 2 diabetes mellitus with stage 4 chronic kidney disease, with long-term current use of insulin  (HCC) * : 07/26/2015 . * : Type II diabetes mellitus with neurological manifestations (HCC) * : 02/18/2020 . * : Vitamin B12 deficiency * : 06/16/2019 . * : Vitamin D deficiency * : 05/07/2018 Past Surgical History: Procedure * : Laterality * : Date . * : AMPUTATION TOE * : Left * : 03/13/2018 * : Procedure: AMPUTATION TOE-MPJ; Surgeon: Ashley Soulier, DPM; Location: [facility] ORS; Service: Podiatry; Laterality: Left; . * : ANGIOPLASTY * : * : . * : AV FISTULA PLACEMENT * : Left * : 08/20/2023 * : Procedure: ARTERIOVENOUS (AV) FISTULA CREATION; Surgeon: Gretta Lonni PARAS, MD; Location: [facility] OR; Service: Vascular; Laterality: Left; . * : CARDIAC CATHETERIZATION * : * : . * : CORONARY ARTERY BYPASS GRAFT * : N/A * : 2003 . * : IR FLUORO GUIDE CV LINE RIGHT * : * : 08/18/2023 . * : IR PERC CHOLECYSTOSTOMY * : * : 11/12/2023 . * : IR US  GUIDE VASC ACCESS RIGHT * : * : 08/18/2023 . * : LAPAROSCOPIC APPENDECTOMY * : N/A * : 09/08/2018 * : Procedure: APPENDECTOMY LAPAROSCOPIC; Surgeon: Jordis Laneta FALCON, MD; Location: [facility] ORS; Service: General; Laterality: N/A; . * : LOWER EXTREMITY ANGIOGRAPHY * : Left * : 03/02/2018 * : Procedure: LOWER EXTREMITY ANGIOGRAPHY; Surgeon: Marea Selinda RAMAN, MD; Location: [facility]; Service: Cardiovascular; Laterality: Left; . * : LOWER EXTREMITY ANGIOGRAPHY * : Left * : 09/30/2022 * : Procedure:  Lower Extremity Angiography; Surgeon: Marea Selinda RAMAN, MD; Location: [facility]; Service: Cardiovascular; Laterality: Left; . * : LOWER EXTREMITY ANGIOGRAPHY * : Left * : 12/26/2023 * : Procedure: Lower Extremity Angiography; Surgeon: Marea Selinda RAMAN, MD; Location: [facility]; Service: Cardiovascular; Laterality: Left; . * : RIGHT/LEFT HEART CATH AND CORONARY/GRAFT ANGIOGRAPHY * : N/A * : 08/01/2023 * : Procedure: RIGHT/LEFT HEART CATH AND CORONARY/GRAFT ANGIOGRAPHY; Surgeon: Wendel Lurena POUR, MD; Location: [facility]; Service: Cardiovascular; Laterality: N/A; . * : ROTATOR CUFF REPAIR * : Left * : Allergies:Patient  has no known allergies.Medications: Prior to Admission medications Medication * : Sig * : Start Date * : End Date * : Taking? * : Authorizing Provider acetaminophen  (TYLENOL ) 500 MG tablet * : Take 1,000 mg by mouth every 6 (six) hours as needed for moderate pain or headache. * : * : * : * : [provider] aspirin  EC 81 MG tablet * : Take 1 tablet (81 mg total) by mouth daily. Swallow whole. * : 07/25/23 * : * : * : Thukkani, Arun K, MD atorvastatin  (LIPITOR) 20 MG tablet * : Take 1 tablet (20 mg total) by mouth daily. * : 07/25/23 * : * : * : Thukkani, Arun K, MD carvedilol  (COREG ) 3.125 MG tablet * : Take 1 tablet (3.125 mg total) by mouth 2 (two) times daily. * : 05/20/23 * : 05/02/24 * : * : Carlin Delon BROCKS, NP clopidogrel  (PLAVIX ) 75 MG tablet * : Take 1 tablet (75 mg total) by mouth daily. * : 12/26/23 * : * : * : Dew, Selinda RAMAN, MD gabapentin  (NEURONTIN ) 300 MG capsule * : Take 1 capsule (300 mg total) by mouth at bedtime.Patient taking differently: Take 300 mg by mouth 2 (two) times daily. * : 08/25/23 * : * : * : Arrien, Elidia Sieving, MD insulin  glargine (LANTUS ) 100 UNIT/ML injection * : Inject 7 Units into the skin daily as needed (Diabetes). Inject 7 units at bedtime as needed after checking glucose * : * : * : * : [provider] isosorbide  mononitrate  (IMDUR ) 120 MG 24 hr tablet * : Take 1 tablet (120 mg total) by mouth daily. * : 08/27/23 * : * : * : Revankar, Jennifer SAUNDERS, MD latanoprost  (XALATAN ) 0.005 % ophthalmic solution * : Place 1 drop into both eyes at bedtime. * : 04/15/23 * : * : * : [provider] levothyroxine  (SYNTHROID ) 50 MCG tablet * : Take 50 mcg by mouth daily. * : 04/05/23 * : * : * : [provider] nitroGLYCERIN  (NITROSTAT ) 0.4 MG SL tablet * : Place 1 tablet (0.4 mg total) under the tongue every 5 (five) minutes as needed for chest pain. * : 11/20/20 * : * : * : Revankar, Jennifer SAUNDERS, MD sodium chloride  flush (NS) 0.9 % SOLN * : Inject 5 mLs into the vein daily for 15 days. flush drain daily with 5 cc NS, record output daily, dressing changes every 2-3 days or earlier if soiled * : 11/14/23 * : 12/15/23 * : * : Darci Pore, MD tamsulosin  (FLOMAX ) 0.4 MG CAPS capsule * : Take 0.4 mg by mouth daily after breakfast. * : 04/19/20 * : * : * : [provider] vitamin B-12 (CYANOCOBALAMIN ) 500 MCG tablet * : Take 500 mcg by mouth daily. * : * : * : * : [provider] Vitamin D, Ergocalciferol, (DRISDOL) 1.25 MG (50000 UNIT) CAPS capsule * : Take 50,000 Units by mouth once a week. Mondays * : 07/13/21 * : * : * : [provider] Family History Problem * : Relation * : Age of Onset . * : Hyperlipidemia * : Mother * : . * : Hypertension * : Mother * : . * : Diabetes * : Mother * : . * : Heart attack * : Brother * : . * : Heart disease * : Brother * : . * : Lung disease * : Father * : Socioeconomic History . * : Marital status: * :  Married * : * : Spouse name: * : Not on file . * : Number of children: * : Not on file . * : Years of education: * : Not on file . * : Highest education level: * : Not on file Occupational History . * : Not on file Tobacco Use . * : Smoking status: * : Former * : * : Current packs/day: * : 0.00 * : * : Average packs/day: * : 0.5 packs/day for 25.0 years (12.5 ttl pk-yrs) * : * :  Types: * : Cigarettes * : * : Start date: * : 18 * : * : Quit date: * : 2003 * : * : Years since quitting: * : 22.5 . * : Smokeless tobacco: * : Never Vaping Use . * : Vaping status: * : Never Used Substance and Sexual Activity . * : Alcohol use: * : Not Currently * : * : Comment: 2003 . * : Drug use: * : Never . * : Sexual activity: * : Not on file Other Topics * : Concern . * : Not on file Social History Narrative . * : Not on file Financial Resource Strain: Low Risk  (09/26/2020) * : Received from [facility] * : Overall Financial Resource Strain (CARDIA) * : . * : Difficulty of Paying Living Expenses: Not hard at all Food Insecurity: No Food Insecurity (11/12/2023) * : Hunger Vital Sign * : . * : Worried About Running Out of Food in the Last Year: Never true * : . * : Ran Out of Food in the Last Year: Never true Transportation Needs: No Transportation Needs (11/12/2023) * : PRAPARE - Transportation * : . * : Lack of Transportation (Medical): No * : . * : Lack of Transportation (Non-Medical): No Physical Activity: Inactive (09/26/2020) * : Received from [facility] * : Exercise Vital Sign * : . * : On average, how many days per week do you engage in moderate to strenuous exercise (like a brisk walk)?: 0 days * : . * : On average, how many minutes do you engage in exercise at this level?: 0 min Stress: No Stress Concern Present (09/26/2020) * : Received from [facility] * : [facility] Occupational Health - Occupational Stress Questionnaire * : . * : Feeling of Stress : Not at all Social Connections: Moderately Integrated (11/12/2023) * : Social Connection and Isolation Panel * : . * : Frequency of Communication with Friends and Family: Three times a week * : . * : Frequency of Social Gatherings with Friends and Family: Once a week * : . * : Attends Religious Services: 1 to 4 times per year * : . * : Active Member of Clubs or Organizations: No * : . * : Attends Club or Organization Meetings: Never  * : . * : Marital Status: Married Review of Systems: A 12 point ROS discussed and pertinent positives are indicated in the HPI above. All other systems are negative.Physical ExamConstitutional: Appearance: Normal appearance. Abdominal: General: Abdomen is flat. Palpations: Abdomen is soft. Comments: RUQ drain appropriately dressed. Dressing is clean, dry, intact.Drain incision site non-tender, without evidence of infection. Retaining suture and Stat Lock in place. Good bilious output in collection bag. Neurological: Mental Status: He is alert. Imaging: PERIPHERAL VASCULAR CATHETERIZATIONResult Date: 7/25/2025See surgical note for result. VAS US  ABI WITH/WO TBIResult Date: 12/23/2023 LOWER EXTREMITY DOPPLER STUDY Patient Name: Phillip [name] Date of Exam: 12/22/2023 Medical  Rec #: [id number] Accession #: [id number] Date of Birth: 1955-06-17 Patient Gender: M Patient Age: 58 years Exam Location: [facility] Procedure: VAS US  ABI WITH/WO TBI Referring Phys: -------------------------------------------------------------------------------- Indications: Peripheral artery disease. Vascular Interventions: 09/30/2022 Percutaneous transluminal angioplasty of left tibioperoneal trunk and most proximal peroneal artery with 4 mm diameter by 6 cm length Lutonix drug-coated angioplasty balloon 5. Percutaneous transluminal angioplasty of left popliteal artery with 6 mm diameter by 8 cm length Lutonix drug-coated angioplasty balloon. Comparison Study: 09/2022 Performing Technologist: Jerel Croak RVT Examination Guidelines: A complete evaluation includes at minimum, Doppler waveform signals and systolic blood pressure reading at the level of bilateral brachial, anterior tibial, and posterior tibial arteries, when vessel segments are accessible. Bilateral testing is considered an integral part of a complete examination. Photoelectric Plethysmograph (PPG) waveforms and toe systolic pressure readings are included as required  and additional duplex testing as needed. Limited examinations for reoccurring indications may be performed as noted. ABI Findings: +---------+------------------+-----+----------+--------+ Right Rt Pressure (mmHg)IndexWaveform Comment  +---------+------------------+-----+----------+--------+ Brachial 118     +---------+------------------+-----+----------+--------+ ATA   monophasicNonComp  +---------+------------------+-----+----------+--------+ PTA   monophasicNonComp  +---------+------------------+-----+----------+--------+ Great Toe50 0.42 Abnormal   +---------+------------------+-----+----------+--------+ +---------+------------------+-----+--------+-------+ Left Lt Pressure (mmHg)IndexWaveformComment +---------+------------------+-----+--------+-------+ Brachial 120     +---------+------------------+-----+--------+-------+ ATA   biphasicNonComp +---------+------------------+-----+--------+-------+ PTA   biphasicNonComp +---------+------------------+-----+--------+-------+ Great Toe82 0.68 Abnormal  +---------+------------------+-----+--------+-------+ +-------+-----------+-------------+------------+------------+ ABI/TBIToday's ABIToday's TBI Previous ABIPrevious TBI +-------+-----------+-------------+------------+------------+ Right NonComp .42 NonComp .50  +-------+-----------+-------------+------------+------------+ Left NonComp .68 (2nd toe)NonComp Amp  +-------+-----------+-------------+------------+------------+ Bilateral ABIs appear essentially unchanged compared to prior study on 09/2022. Summary: Right: Resting right ankle-brachial index indicates noncompressible right lower extremity arteries. The right toe-brachial index is abnormal. Left: Resting left ankle-brachial index indicates noncompressible left lower extremity arteries. The left toe-brachial index is abnormal. Great toe amp; 2nd toe near normal TBI. *See table(s) above  for measurements and observations. Electronically signed by Cordella Shawl MD on 12/23/2023 at 7:24:13 AM. Final Labs:CBC: Recent Labs 06/10/25193006/11/25063106/12/25065206/13/250552 WBC12.1*8.16.36.8HGB12.8*10.8*11.6*11.4*HCT38.8*32.9*36.8*35.1*PLT81*76*85*95* COAGS: Recent Labs 03/16/25022806/10/25193006/11/251011 INR1.4*1.4*1.3* BMP: Recent Labs 06/10/25193006/11/25063106/12/25065206/13/250552 WJ865*865*864864 K3.83.74.74.1CL94*96*94*95*[id number] GLUCOSE148*85175*112*BUN31*35*61*35*CALCIUM8.1*7.8*8.0*8.4*CREATININE4.93*5.52*7.54*5.48*GFRNONAA12*11*7*11* LIVER FUNCTION TESTS: Recent Labs 03/24/25031606/10/25193006/11/25063106/12/250821 BILITOT -- 4.1*4.8*2.4*AST -- 54*49*187*ALT -- 51*4297*ALKPHOS -- 841*865*876EMNU -- 8.07.08.1ALBUMIN3.63.2*2.7*2.7* TUMOR MARKERS: No results for input(s): AFPTM, CEA, CA199, CHROMGRNA in the last 8760 hours. Assessment and Plan:Acute cholecystitis s/p percutaneous cholecystostomy drain placement on 6/11. *Patient is well overall, with expected discomfort from drain. *Good daily bilious output into drainage bag. *Patient may flush drain every 3 days, or sooner with concerns of decreased output or clogged drain. *Patient is currently undergoing extensive cardiac evaluation and is pending TAVR procedure. He will have to obtain clearance by his cardiac team prior to undergoing potential cholecystectomy. *Keep follow-up appointment with Surgery service on 8/11. *IR will see patient in clinic every 6-8 weeks for regular drain exchanges until he is cleared for cholecystectomy, or alternate plan by care team. *Drain was exchanged today by Dr. Johann. Return in 6-8 weeks, on a recurring basis, for drain exchanges. *Return to clinic precautions given to patient and his wife. *Both patient and his wife voiced understanding and are amenable to this plan. Electronically Signed:Charles A Carim PA-C7/30/2025, 5:56 PM Electronically Signed   By: JONETTA Johann M.D.   On: 01/01/2024 11:10   IR  Catheter Tube Change Result Date: 01/01/2024 INDICATION: Acute calculus cholecystitis, status post cholecystostomy catheter placement 11/12/2023, presents for scheduled exchange. No complicating features. EXAM: PERCUTANEOUS CHOLECYSTOSTOMY CATHETER EXCHANGE UNDER FLUOROSCOPY MEDICATIONS: No periprocedural antibiotics were indicated ANESTHESIA/SEDATION: Lidocaine  1% subcutaneous FLUOROSCOPY: Radiation Exposure Index (as provided by the fluoroscopic device): 22 mGy Kerma COMPLICATIONS: None immediate. PROCEDURE: Informed written consent  was obtained from the patient after a thorough discussion of the procedural risks, benefits and alternatives. All questions were addressed. Maximal Sterile Barrier Technique was utilized including caps, mask, sterile gowns, sterile gloves, sterile drape, hand hygiene and skin antiseptic. A timeout was performed prior to the initiation of the procedure. Small contrast injection through the existing catheter confirmed placement within the gallbladder lumen. Catheter cut and exchanged over an angled Glidewire. A new 10 French per pigtail catheter was advanced and positioned centrally in the gallbladder. Contrast injection confirms good positioning. Catheter secured externally 0 Prolene suture and placed to external drain bag. The patient tolerated the procedure well. IMPRESSION: 1. Technically successful exchange of cholecystostomy catheter under fluoroscopy. Electronically Signed   By: JONETTA Faes M.D.   On: 01/01/2024 11:08   PERIPHERAL VASCULAR CATHETERIZATION Result Date: 12/26/2023 See surgical note for result.   There are no new results to review at this time.  Previous records (including but not limited to H&P, progress notes, nursing notes, TOC management) were reviewed in assessment of this patient.  Labs: CBC: Recent Labs  Lab 01/20/24 0417 01/20/24 1752 01/21/24 0331 01/22/24 0347 01/23/24 0418 01/24/24 0353  WBC 10.7*  --  11.0* 12.6* 13.3* 12.2*  HGB  11.6* 11.6* 11.2* 9.8* 10.6* 10.8*  HCT 36.1* 34.0* 35.0* 30.1* 32.9* 33.7*  MCV 95.3  --  94.9 94.1 93.5 93.9  PLT 221  --  197 169 178 178   Basic Metabolic Panel: Recent Labs  Lab 01/19/24 0442 01/19/24 1844 01/20/24 0417 01/20/24 1752 01/21/24 0331 01/22/24 0347 01/23/24 0418 01/24/24 0353  NA 133*   < > 134* 124* 131* 132* 133* 134*  K 4.2   < > 4.4 4.2 4.8 5.3* 4.8 4.7  CL 93*   < > 93* 86* 92* 90* 92* 92*  CO2 25   < > 24  --  22 21* 23 22  GLUCOSE 129*   < > 105* 116* 109* 120* 129* 122*  BUN 43*   < > 55* 58* 72* 96* 61* 78*  CREATININE 7.16*   < > 8.78* 8.40* 10.74* 12.08* 8.48* 10.31*  CALCIUM  8.7*   < > 9.1  --  8.5* 8.2* 9.1 8.7*  MG 2.1  --   --   --  2.2  --   --   --    < > = values in this interval not displayed.   Liver Function Tests: Recent Labs  Lab 01/19/24 1844  AST 22  ALT <5  ALKPHOS 161*  BILITOT 0.8  PROT 9.4*  ALBUMIN  2.5*   CBG: Recent Labs  Lab 01/23/24 0609 01/23/24 1101 01/23/24 1519 01/23/24 2125 01/24/24 0614  GLUCAP 127* 115* 124* 125* 123*    Scheduled Meds:  (feeding supplement) PROSource Plus  30 mL Oral BID BM   aspirin  EC  81 mg Oral Daily   atorvastatin   20 mg Oral Daily   carvedilol   3.125 mg Oral BID   Chlorhexidine  Gluconate Cloth  6 each Topical Q0600   Chlorhexidine  Gluconate Cloth  6 each Topical Q0600   clopidogrel   75 mg Oral Daily   heparin   5,000 Units Subcutaneous Q8H   insulin  aspart  0-6 Units Subcutaneous TID WC   isosorbide  mononitrate  120 mg Oral Daily   levothyroxine   50 mcg Oral Daily   sevelamer  carbonate  800 mg Oral TID WC   sodium chloride  flush  3 mL Intravenous Q12H   tamsulosin   0.4 mg Oral QPC breakfast  Continuous Infusions:  sodium chloride      albumin  human     norepinephrine  (LEVOPHED ) Adult infusion     PRN Meds:.sodium chloride , acetaminophen  **OR** acetaminophen , albuterol , HYDROmorphone  (DILAUDID ) injection, menthol -cetylpyridinium, nitroGLYCERIN , oxyCODONE , polyethylene  glycol, sodium chloride  flush  Family Communication: None at bedside  Disposition: Status is: Inpatient Remains inpatient appropriate because: TMA, ESRD     Time spent: 35 minutes  Length of inpatient stay: 10 days  Author: Carliss LELON Canales, DO 01/24/2024 12:36 PM  For on call review [email or web address].

## 2024-01-24 NOTE — Progress Notes (Signed)
 [facility] Progress Note   Subjective:  Seen in K[facility]. BP dropping during HD. Will adjust UF goal, no UF this treatment   Objective Vitals:   01/24/24 0800 01/24/24 0900 01/24/24 1003 01/24/24 1018  BP:   (!) 92/56 102/62  Pulse: 80 89 79 72  Resp: 10 16 16 11   Temp:   97.6 F (36.4 C)   TempSrc:      SpO2: 98% 100% 97% 99%  Weight:   73 kg   Height:       Physical Exam General: Alert, sitting up in bed, nad  Heart: RRR Lungs: Clear  Abdomen: soft, NTND Extremities: no LE edema, L TMA w/wound vac in place Dialysis Access: LU AVF +b/t   Filed Weights   01/22/24 1230 01/23/24 0308 01/24/24 1003  Weight: 74 kg 74.9 kg 73 kg   No intake or output data in the 24 hours ending 01/24/24 1030   Additional Objective Labs: Basic Metabolic Panel: Recent Labs  Lab 01/22/24 0347 01/23/24 0418 01/24/24 0353  NA 132* 133* 134*  K 5.3* 4.8 4.7  CL 90* 92* 92*  CO2 21* 23 22  GLUCOSE 120* 129* 122*  BUN 96* 61* 78*  CREATININE 12.08* 8.48* 10.31*  CALCIUM  8.2* 9.1 8.7*   Liver Function Tests: Recent Labs  Lab 01/19/24 1844  AST 22  ALT <5  ALKPHOS 161*  BILITOT 0.8  PROT 9.4*  ALBUMIN  2.5*   CBC: Recent Labs  Lab 01/20/24 0417 01/20/24 1752 01/21/24 0331 01/22/24 0347 01/23/24 0418 01/24/24 0353  WBC 10.7*  --  11.0* 12.6* 13.3* 12.2*  HGB 11.6*   < > 11.2* 9.8* 10.6* 10.8*  HCT 36.1*   < > 35.0* 30.1* 32.9* 33.7*  MCV 95.3  --  94.9 94.1 93.5 93.9  PLT 221  --  197 169 178 178   < > = values in this interval not displayed.   CBG: Recent Labs  Lab 01/23/24 0609 01/23/24 1101 01/23/24 1519 01/23/24 2125 01/24/24 0614  GLUCAP 127* 115* 124* 125* 123*    Medications:  sodium chloride      norepinephrine  (LEVOPHED ) Adult infusion      (feeding supplement) PROSource Plus  30 mL Oral BID BM   aspirin  EC  81 mg Oral Daily   atorvastatin   20 mg Oral Daily   carvedilol   3.125 mg Oral BID   Chlorhexidine  Gluconate Cloth  6 each  Topical Q0600   Chlorhexidine  Gluconate Cloth  6 each Topical Q0600   clopidogrel   75 mg Oral Daily   heparin   5,000 Units Subcutaneous Q8H   insulin  aspart  0-6 Units Subcutaneous TID WC   isosorbide  mononitrate  120 mg Oral Daily   levothyroxine   50 mcg Oral Daily   sevelamer  carbonate  800 mg Oral TID WC   sodium chloride  flush  3 mL Intravenous Q12H   tamsulosin   0.4 mg Oral QPC breakfast    Dialysis Orders: [facility] TTS - No heparin  - Left AVF (in use for 2 weeks) - EDW 81 kg - will need EDW lowered at d/c 4 hr 350/800 2K/2.5 Ca Bicarb 39 Mircera: 50 mcg ordered - but has not received any ESA yet No VDRA Renvela  800 mg TID WC   Assessment/Plan: PAD/ Left third toe gangrene: 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 Then TMA completed on 01/17/24 by Dr. Serene.  Severe Aortic Stenosis: s/p TAVR 01/20/24  ESRD:  Usual HD TTS. Back on schedule. HD today  Hypertension/volume: BP in goal. On carvedilol  3.125mg  BID - hold in AM on HD days.  Use albumin  with HD for BP support. UF as able.  Chronic HFrEF: Continue volume removal with HD.  Recent acute cholecystitis requiring cholecystostomy drain 6/25: drain is in place.  Anemia: Hgb 10-11.  No indication for ESA at this time.  Metabolic bone disease: restarted home Sevelamer  800 TID WC. Corrected calcium  10.3. Check phos. Not on VDRA at his OP clinic. Continue home binder DMT2: Insulin  per primary.  Nutrition:  Albumin  low. Cont supp. On renal diet with fluid restriction  Maisie Ronnald Acosta PA-C [facility] 01/24/2024,10:30 AM

## 2024-01-24 NOTE — Progress Notes (Signed)
 Received patient in bed.Alert and oriented x 4.  Access used : Left arm avf that worked well.  Duration of treatment: 3.25 hours.  U f goal : 2 liters.  Medicine given: Albumin  25 g.  Hemo comment: Despite blood support ordered,he was not tolerating the prescribed uf,made even as verbal ordered.  His circuit clogged on his last 45 minutes of his treatment,this was a second set-up for him.  Hand off to the patient nurse ,back into his room with stable condition via transporter.

## 2024-01-24 NOTE — Plan of Care (Signed)

## 2024-01-24 NOTE — Progress Notes (Signed)
 Mobility Specialist Progress Note:   01/24/24 0915  Mobility  Activity Pivoted/transferred from bed to chair  Level of Assistance Minimal assist, patient does 75% or more  Assistive Device Other (Comment) (HHA)  Distance Ambulated (ft) 3 ft  Activity Response Tolerated well  Mobility Referral Yes  Mobility visit 1 Mobility  Mobility Specialist Start Time (ACUTE ONLY) 0915  Mobility Specialist Stop Time (ACUTE ONLY) 0930  Mobility Specialist Time Calculation (min) (ACUTE ONLY) 15 min   Pt agreeable to mobility session. Required minA to stand with HHA. Required cues to maintain heel WB in darco shoe. Politely declined ambulation d/t L foot pain. Left sitting in chair with all needs met, alarm on.  Therisa Rana Mobility Specialist Please contact via SecureChat or  Rehab office at [phone]

## 2024-01-25 DIAGNOSIS — T8754 Necrosis of amputation stump, left lower extremity: Secondary | ICD-10-CM

## 2024-01-25 DIAGNOSIS — I5042 Chronic combined systolic (congestive) and diastolic (congestive) heart failure: Secondary | ICD-10-CM | POA: Diagnosis not present

## 2024-01-25 DIAGNOSIS — N186 End stage renal disease: Secondary | ICD-10-CM | POA: Diagnosis not present

## 2024-01-25 DIAGNOSIS — Z952 Presence of prosthetic heart valve: Secondary | ICD-10-CM | POA: Diagnosis not present

## 2024-01-25 DIAGNOSIS — I2510 Atherosclerotic heart disease of native coronary artery without angina pectoris: Secondary | ICD-10-CM | POA: Diagnosis not present

## 2024-01-25 LAB — GLUCOSE, CAPILLARY
Glucose-Capillary: 98 mg/dL (ref 70–99)
Glucose-Capillary: 108 mg/dL — ABNORMAL HIGH (ref 70–99)
Glucose-Capillary: 127 mg/dL — ABNORMAL HIGH (ref 70–99)
Glucose-Capillary: 139 mg/dL — ABNORMAL HIGH (ref 70–99)
Glucose-Capillary: 149 mg/dL — ABNORMAL HIGH (ref 70–99)

## 2024-01-25 LAB — BASIC METABOLIC PANEL WITH GFR
Anion gap: 19 — ABNORMAL HIGH (ref 5–15)
BUN: 46 mg/dL — ABNORMAL HIGH (ref 8–23)
CO2: 22 mmol/L (ref 22–32)
Calcium: 8.8 mg/dL — ABNORMAL LOW (ref 8.9–10.3)
Chloride: 94 mmol/L — ABNORMAL LOW (ref 98–111)
Creatinine, Ser: 7.03 mg/dL — ABNORMAL HIGH (ref 0.61–1.24)
GFR, Estimated: 8 mL/min — ABNORMAL LOW (ref >60)
Glucose, Bld: 97 mg/dL (ref 70–99)
Potassium: 4.6 mmol/L (ref 3.5–5.1)
Sodium: 135 mmol/L (ref 135–145)

## 2024-01-25 LAB — CBC
HCT: 31.8 % — ABNORMAL LOW (ref 39.0–52.0)
Hemoglobin: 10.1 g/dL — ABNORMAL LOW (ref 13.0–17.0)
MCH: 30.1 pg (ref 26.0–34.0)
MCHC: 31.8 g/dL (ref 30.0–36.0)
MCV: 94.6 fL (ref 80.0–100.0)
Platelets: 188 K/uL (ref 150–400)
RBC: 3.36 MIL/uL — ABNORMAL LOW (ref 4.22–5.81)
RDW: 15 % (ref 11.5–15.5)
WBC: 13.8 K/uL — ABNORMAL HIGH (ref 4.0–10.5)
nRBC: 0 % (ref 0.0–0.2)

## 2024-01-25 NOTE — Plan of Care (Signed)
  Problem: Health Behavior/Discharge Planning: Goal: Ability to manage health-related needs will improve Outcome: Progressing   Problem: Clinical Measurements: Goal: Ability to maintain clinical measurements within normal limits will improve Outcome: Progressing Goal: Will remain free from infection Outcome: Progressing Goal: Diagnostic test results will improve Outcome: Progressing Goal: Respiratory complications will improve Outcome: Progressing Goal: Cardiovascular complication will be avoided Outcome: Progressing   Problem: Activity: Goal: Risk for activity intolerance will decrease Outcome: Progressing   Problem: Coping: Goal: Level of anxiety will decrease Outcome: Progressing   Problem: Elimination: Goal: Will not experience complications related to bowel motility Outcome: Progressing   Problem: Pain Managment: Goal: General experience of comfort will improve and/or be controlled Outcome: Progressing   Problem: Safety: Goal: Ability to remain free from injury will improve Outcome: Progressing   Problem: Skin Integrity: Goal: Risk for impaired skin integrity will decrease Outcome: Progressing

## 2024-01-25 NOTE — Progress Notes (Signed)
 Progress Note   Patient: Phillip [name] FMW:[id number] DOB: 1955/12/23 DOA: 01/14/2024  DOS: the patient was seen and examined on 01/25/2024   Brief hospital course:  68/M w ESRD on HD TTS, DM-1, recent acute cholecystitis June 2025) requiring cholecystostomy tube placement, chronic HFrEF, aortic stenosis (TAVR postponed due to left third toe gangrene), PAD-s/p PCI to left tibia-fibula/angioplasty on 7/25 who was admitted by vascular surgery 8/13 for an elective left third toe amputation. H/o significant issues with nonhealing ulceration of his left foot-progressing into a gangrenous left third toe, underwent a angiogram of LLE with transluminal angioplasty/stent placement to the left tibioperoneal trunk on 7/25 at [facility]. Plans were to proceed with a left third toe amputation post revascularization. However due to his significant underlying medical comorbidities, this could not be done at [facility], referred to the [facility] surgery and admitted to [facility].   8/13 - left 3rd toe amputation 8/15 - left TMA and wound vac placment 8/19 - TAVR 8/22 - wound vac removed   Assessment and Plan:   Left third toe gangrene/MTA foot necrosis - S/p transmetatarsal amputation with wound VAC placement 8/15.  Wound VAC removed 8/22.  Leukocytosis persisted.  Unfortunately TMA wound continues to be dusky and necrotic appearing.  Will likely warrant further amputation.  Vascular surgery following closely.  Severe aortic stenosis - S/p TAVR 8/19.  Cardiology following closely.  Radiology placed Zio patch 8/22 given new LBBB.   Chronic HFrEF - Appears euvolemic at this time.  Volume removal per HD/UF.   ESRD - HD TTS.  Nephrology following closely.  LUE AVF.   CAD with CABG - Last cath 2/25 noting severe CAD with all vein grafts occluded.  Continue aspirin , Plavix , statin.   Recent history of acute cholecystitis requiring cholecystostomy drain-11/2023 - Percutaneous cholecystectomy drain in place.      Diabetes mellitus - A1c 5.8 suggesting excellent control.  Insulin  sliding scale more.   Normocytic anemia - Likely anemia of chronic kidney disease.  Hemoglobin stable.   Hypothyroidism - Synthroid  on board.   Goals of care - Currently being evaluated for inpatient rehab/CIR.  Working closely with TOC on disposition planning.  Initially anticipated discharge this weekend/Monday however extremity appears more necrotic, likely to pursue further amputation.   Subjective: Patient resting comfortably this morning.  Denies any fever, chills, chest pain, nausea, vomiting abdominal pain.    Physical Exam:  Vitals:   01/25/24 0447 01/25/24 0500 01/25/24 0754 01/25/24 1057  BP: 98/63  104/60 (!) 94/59  Pulse: 92   80  Resp: 18  16   Temp: 98.5 F (36.9 C)  98.4 F (36.9 C)   TempSrc: Oral  Oral   SpO2: 96%     Weight:  69.3 kg    Height:        GENERAL:  Alert, pleasant, no acute distress  HEENT:  EOMI CARDIOVASCULAR:  RRR, no murmurs appreciated RESPIRATORY:  Clear to auscultation, no wheezing, rales, or rhonchi GASTROINTESTINAL:  Soft, nontender, nondistended EXTREMITIES:  No LE edema bilaterally NEURO:  No new focal deficits appreciated SKIN: LLE TMA, no purulence, dusky/necrotic distally PSYCH:  Appropriate mood and affect    Data Reviewed:  Imaging Studies: ECHOCARDIOGRAM COMPLETE Result Date: 01/21/2024    ECHOCARDIOGRAM REPORT   Patient Name:   Phillip [name] Date of Exam: 01/21/2024 Medical Rec #:  [id number]        Height:       72.0 in Accession #:    [id number]  Weight:       165.6 lb Date of Birth:  December 22, 1955       BSA:          1.966 m Patient Age:    68 years         BP:           128/75 mmHg Patient Gender: M                HR:           77 bpm. Exam Location:  Inpatient Procedure: 2D Echo, Cardiac Doppler and Color Doppler (Both Spectral and Color            Flow Doppler were utilized during procedure). Indications:    Post TAVR evaluation. ; I35.0  Nonrheumatic aortic (valve)                 stenosis  History:        Patient has prior history of Echocardiogram examinations, most                 recent 01/20/2024. CHF, CAD, Prior CABG, Pulmonary HTN, Aortic                 Valve Disease, Signs/Symptoms:Edema; Risk Factors:Hypertension                 and Dyslipidemia. ESRD.                 Aortic Valve: 29 mm Edwards Sapien prosthetic, stented (TAVR)                 valve is present in the aortic position. Procedure Date:                 01/20/2024.  Sonographer:    Ellouise Mose RDCS Referring Phys: [id number] [facility] R THOMPSON  Sonographer Comments: Technically difficult study due to poor echo windows. IMPRESSIONS  1. Left ventricular ejection fraction, by estimation, is 30 to 35%. The left ventricle has moderately decreased function. The left ventricle demonstrates regional wall motion abnormalities (see scoring diagram/findings for description). The left ventricular internal cavity size was moderately dilated. There is mild concentric left ventricular hypertrophy. Left ventricular diastolic parameters are consistent with Grade I diastolic dysfunction (impaired relaxation).  2. The mitral valve is degenerative. Mild to moderate mitral valve regurgitation. No evidence of mitral stenosis. Moderate mitral annular calcification.  3. The aortic valve has been repaired/replaced. There is a 29 mm Edwards Sapien prosthetic (TAVR) valve present in the aortic position. Procedure Date: 01/20/2024. Echo findings are consistent with trivial perivalvular leak of the aortic prosthesis.  4. Right ventricular systolic function is moderately reduced. The right ventricular size is normal.  5. Left atrial size was mildly dilated. FINDINGS  Left Ventricle: Left ventricular ejection fraction, by estimation, is 30 to 35%. The left ventricle has moderately decreased function. The left ventricle demonstrates regional wall motion abnormalities. The left ventricular internal cavity size was  moderately dilated. There is mild concentric left ventricular hypertrophy. Left ventricular diastolic parameters are consistent with Grade I diastolic dysfunction (impaired relaxation).  LV Wall Scoring: The inferior wall and posterior wall are akinetic. Right Ventricle: The right ventricular size is normal. No increase in right ventricular wall thickness. Right ventricular systolic function is moderately reduced. Left Atrium: Left atrial size was mildly dilated. Right Atrium: Right atrial size was normal in size. Pericardium: There is no evidence of pericardial effusion. Mitral Valve: The mitral valve is degenerative in appearance.  Moderate mitral annular calcification. Mild to moderate mitral valve regurgitation. No evidence of mitral valve stenosis. MV peak gradient, 7.8 mmHg. The mean mitral valve gradient is 3.0 mmHg. Tricuspid Valve: The tricuspid valve is normal in structure. Tricuspid valve regurgitation is trivial. No evidence of tricuspid stenosis. Aortic Valve: Trivial perivalvular leak and 2 o'clock. The aortic valve has been repaired/replaced. Aortic valve regurgitation is not visualized. No aortic stenosis is present. Aortic valve mean gradient measures 5.0 mmHg. Aortic valve peak gradient measures 10.2 mmHg. Aortic valve area, by VTI measures 4.55 cm. There is a 29 mm Edwards Sapien prosthetic, stented (TAVR) valve present in the aortic position. Procedure Date: 01/20/2024. Echo findings are consistent with normal structure and function of the aortic valve prosthesis. Pulmonic Valve: The pulmonic valve was normal in structure. Pulmonic valve regurgitation is not visualized. No evidence of pulmonic stenosis. Aorta: The aortic root and ascending aorta are structurally normal, with no evidence of dilitation. IAS/Shunts: There is redundancy of the interatrial septum. No atrial level shunt detected by color flow Doppler.  LEFT VENTRICLE PLAX 2D LVIDd:         5.18 cm      Diastology LVIDs:         4.80 cm       LV e' medial:    3.92 cm/s LV PW:         1.28 cm      LV E/e' medial:  22.8 LV IVS:        1.39 cm      LV e' lateral:   5.55 cm/s LVOT diam:     2.90 cm      LV E/e' lateral: 16.1 LV SV:         124 LV SV Index:   63 LVOT Area:     6.61 cm  LV Volumes (MOD) LV vol d, MOD A2C: 277.0 ml LV vol d, MOD A4C: 198.0 ml LV vol s, MOD A2C: 206.0 ml LV vol s, MOD A4C: 138.0 ml LV SV MOD A2C:     71.0 ml LV SV MOD A4C:     198.0 ml LV SV MOD BP:      71.8 ml RIGHT VENTRICLE            IVC RV S prime:     7.40 cm/s  IVC diam: 1.70 cm TAPSE (M-mode): 0.7 cm LEFT ATRIUM             Index        RIGHT ATRIUM           Index LA diam:        3.96 cm 2.01 cm/m   RA Area:     16.20 cm LA Vol (A2C):   38.2 ml 19.43 ml/m  RA Volume:   46.30 ml  23.55 ml/m LA Vol (A4C):   28.2 ml 14.34 ml/m LA Biplane Vol: 32.4 ml 16.48 ml/m  AORTIC VALVE AV Area (Vmax):    5.08 cm AV Area (Vmean):   4.73 cm AV Area (VTI):     4.55 cm AV Vmax:           160.00 cm/s AV Vmean:          100.467 cm/s AV VTI:            0.273 m AV Peak Grad:      10.2 mmHg AV Mean Grad:      5.0 mmHg LVOT Vmax:  123.00 cm/s LVOT Vmean:        71.900 cm/s LVOT VTI:          0.188 m LVOT/AV VTI ratio: 0.69  AORTA Ao Root diam: 3.51 cm Ao Asc diam:  3.59 cm MITRAL VALVE MV Area (PHT): 4.15 cm     SHUNTS MV Area VTI:   3.42 cm     Systemic VTI:  0.19 m MV Peak grad:  7.8 mmHg     Systemic Diam: 2.90 cm MV Mean grad:  3.0 mmHg MV Vmax:       1.40 m/s MV Vmean:      88.5 cm/s MV Decel Time: 183 msec MV E velocity: 89.40 cm/s MV A velocity: 127.00 cm/s MV E/A ratio:  0.70 Toribio Fuel MD Electronically signed by Toribio Fuel MD Signature Date/Time: 01/21/2024/12:14:45 PM    Final    ECHOCARDIOGRAM LIMITED Result Date: 01/20/2024    ECHOCARDIOGRAM LIMITED REPORT   Patient Name:   CABLE FEARN Date of Exam: 01/20/2024 Medical Rec #:  [id number]        Height:       72.0 in Accession #:    [id number]       Weight:       165.8 lb Date of Birth:   Sep 06, 1955       BSA:          1.967 m Patient Age:    68 years         BP:           105/60 mmHg Patient Gender: M                HR:           61 bpm. Exam Location:  Inpatient Procedure: Limited Color Doppler (Both Spectral and Color Flow Doppler were            utilized during procedure). Indications:     aortic stenosis. TAVR  History:         Patient has prior history of Echocardiogram examinations, most                  recent 08/01/2023. CAD, Prior CABG, end stage renal disease;                  Risk Factors:Hypertension, Dyslipidemia, Diabetes and Sleep                  Apnea.                  Aortic Valve: 29 mm valve is present in the aortic position.                  Procedure Date: 01/20/2024.  Sonographer:     Tinnie Barefoot RDCS Referring Phys:  [id number] [facility] THOMPSON Diagnosing Phys: Darryle Decent MD IMPRESSIONS  1. Echo guided TAVR. Vmax 1.21 m/s, MG 3.0 mmHG, EOA 4.57 cm2. No regurgitation or paravalvular leak. Aortic valve regurgitation is not visualized. There is a 29 mm valve present in the aortic position. Procedure Date: 01/20/2024. Aortic valve area, by VTI measures 4.57 cm. Aortic valve mean gradient measures 3.0 mmHg. Aortic valve Vmax measures 1.21 m/s.  2. Left ventricular ejection fraction, by estimation, is 30 to 35%. The left ventricle has moderately decreased function.  3. Right ventricular systolic function is normal. The right ventricular size is normal.  4. The mitral valve is degenerative. Mild mitral valve regurgitation. No evidence of mitral stenosis.  FINDINGS  Left Ventricle: Left ventricular ejection fraction, by estimation, is 30 to 35%. The left ventricle has moderately decreased function.  LV Wall Scoring: The inferior wall and posterior wall are akinetic. Right Ventricle: The right ventricular size is normal. No increase in right ventricular wall thickness. Right ventricular systolic function is normal. Pericardium: There is no evidence of pericardial effusion.  Mitral Valve: The mitral valve is degenerative in appearance. Mild mitral valve regurgitation. No evidence of mitral valve stenosis. Tricuspid Valve: The tricuspid valve is grossly normal. Tricuspid valve regurgitation is mild . No evidence of tricuspid stenosis. Aortic Valve: Echo guided TAVR. Vmax 1.21 m/s, MG 3.0 mmHG, EOA 4.57 cm2. No regurgitation or paravalvular leak. Aortic valve regurgitation is not visualized. Aortic regurgitation PHT measures 446 msec. Aortic valve mean gradient measures 3.0 mmHg. Aortic valve peak gradient measures 5.9 mmHg. Aortic valve area, by VTI measures 4.57 cm. There is a 29 mm valve present in the aortic position. Procedure Date: 01/20/2024. Aorta: The aortic root and ascending aorta are structurally normal, with no evidence of dilitation. Additional Comments: Spectral Doppler performed. Color Doppler performed.  LEFT VENTRICLE PLAX 2D LVIDd:         5.30 cm LV PW:         1.00 cm LV IVS:        1.20 cm LVOT diam:     2.68 cm LV SV:         105 LV SV Index:   54 LVOT Area:     5.64 cm  LV Volumes (MOD) LV vol d, MOD A4C: 181.0 ml LV vol s, MOD A4C: 117.0 ml LV SV MOD A4C:     181.0 ml LEFT ATRIUM         Index LA diam:    3.70 cm 1.88 cm/m  AORTIC VALVE AV Area (Vmax):    4.90 cm AV Area (Vmean):   4.80 cm AV Area (VTI):     4.57 cm AV Vmax:           121.00 cm/s AV Vmean:          80.400 cm/s AV VTI:            0.231 m AV Peak Grad:      5.9 mmHg AV Mean Grad:      3.0 mmHg LVOT Vmax:         105.00 cm/s LVOT Vmean:        68.400 cm/s LVOT VTI:          0.187 m LVOT/AV VTI ratio: 0.81 AI PHT:            446 msec  AORTA Ao Root diam: 3.40 cm Ao Asc diam:  3.30 cm  SHUNTS Systemic VTI:  0.19 m Systemic Diam: 2.68 cm Darryle Decent MD Electronically signed by Darryle Decent MD Signature Date/Time: 01/20/2024/6:03:09 PM    Final    Structural Heart Procedure Result Date: 01/20/2024 See surgical note for result.  IR Radiologist Eval & Mgmt Result Date: 01/01/2024 : Chief  Complaint:Patient was seen in consultation today for acute cholecystitis s/p percutaneous cholecystostomy drain placement,Referring Physician(s):Dr. Dreama Hanger, MD Supervising Physician: Johann, DanielHistory of Present Illness:Bharat D Delo is a 68 y.o. male with past medical history of ESRD on HD, severe aortic stenosis planning on TAVR soon, HFrEF, NSTEMI 2022, CAD s/p CABG in 2003, GERD, DM2, HTN, thrombocytopenia, and prior lap appendectomy in 2020 who presented to [facility] ED with acute abdominal pain after he was found  to be febrile at his dialysis center 6/10. Initially his RUQ US  was nonspecific for acute cholecystitis despite RUQ pain, tachycardia, fever, and elevated lactic acid. However, subsequent MR Abdomen/MRCP showed a distended gallbladder with innumerable stones, without biliary duct dilation. Due to notable cardiac and ESRD comorbidities, patient was recommended for percutaneous cholecystostomy drain placement, which was placed in IR on 6/11. He improved subsequently, and eventually discharged with multiple follow-up appointments, including with outpatient IR clinic. His planned TAVR pprocedure was postponed in light of his acute cholecystitis.Patient has been seen by cardiology and vascular surgery. His TAVR remains pending. He was informed by his cardiology team that his potential cholecystectomy will have to be deferred pending cardiac evaluation, TAVR procedure, and eventual clearance.Patient presents to IR clinic today for 7 week follow-up. He endorses expected discomfort from his drain, but other is overall well. He is tired since his dialysis session yesterday. He has a necrotic toe, being evaluated, that causes him pain. His wife is at his side. His drain has good bilious output daily. His wife flushes it daily. He denies fevers, chills, abdominal pain, nausea, vomiting. His surgical follow-up appointment is scheduled for 8/11 with Dr. Dasie. Past Medical History: Diagnosis * : Date . * :  Abdominopelvic abscess (HCC) * : 09/29/2018 . * : Abscess of appendix * : 09/22/2018 . * : Acquired spondylolisthesis of lumbosacral region * : 03/20/2020 . * : Acute cholecystitis * : 11/15/2023 . * : Acute kidney injury (HCC) * : 10/07/2018 . * : Acute kidney injury superimposed on chronic kidney disease (HCC) * : 11/06/2020 . * : Acute kidney injury superimposed on CKD (HCC) * : 02/18/2020 . * : Acute on chronic systolic (congestive) heart failure (HCC) * : 07/26/2015 * : Formatting of this note might be different from the original. Formatting of this note might be different from the original. Formatting of this note might be different from the original. revenkar EF 35% Formatting of this note might be different from the original. revenkar EF 35% . * : Acute on chronic systolic CHF (congestive heart failure) (HCC) * : 08/12/2023 . * : Anasarca * : 07/01/2023 . * : Anemia of chronic disease * : 08/18/2019 . * : Anemia, chronic disease * : 08/18/2019 . * : Aortic atherosclerosis (HCC) * : . * : Aortic stenosis * : * : a.) TTE 09/15/2018: mild-mod (MPG 19); b.) TTE 03/21/2021: mild-mod (MPG 14.3); c.) TTE 04/24/2022: mild- mod (MPG 15) . * : Aortic stenosis, moderate * : 10/15/2019 . * : Appendicitis * : 09/08/2018 . * : ARF (acute renal failure) (HCC) * : 02/19/2020 . * : Asthma * : . * : Atherosclerosis of native arteries of the extremities with ulceration (HCC) * : 02/24/2018 * : Formatting of this note might be different from the original. Last Assessment & Plan: His ABIs today are 1.07 on the right and 0.99 on the left with biphasic waveforms. Although these pressures may be somewhat elevated from medial calcification, his flow does appear to be reasonably good. His left ABI was 0.58 prior to intervention. At this point, we will stretch out his follow-up and see hi . * : Benign hypertension with CKD (chronic kidney disease) stage IV (HCC) * : 02/24/2018 . * : Benign prostatic hyperplasia without lower urinary  tract symptoms * : 01/14/2018 . * : Bilateral lower extremity edema * : 11/04/2018 . * : Bradycardia * : 03/25/2018 . * : Bruit of right carotid artery * :  07/26/2015 . * : CAD (coronary artery disease) * : 2003 * : a.) s/p 4v CABG 2003 . * : Cardiac murmur * : 07/26/2015 . * : Choledocholithiasis * : 11/12/2023 . * : Chronic combined systolic and diastolic CHF (congestive heart failure) (HCC) * : 07/26/2015 * : a.) TTE 09/15/2018: EF 50-55%, mod LVH, RVE, BAE, mild-mod TR, AoV sclerosis, G1DD; b.) MPI 10/27/2019: EF <30%; c.) TTE 03/21/2021: EF 35-40%, post AK, inf HK, mod LVH, mod red RVSF, mod LAE, mod Aov sclerosis, G2DD; c.) TTE 04/24/2022: EF 35-40%, post AK, glok HK, mod LVE, mod red RVSF, mild-mod MR, Aov sclerosis, G3DD . * : Chronic idiopathic constipation * : 11/02/2018 . * : Chronic low back pain without sciatica * : 12/02/2018 . * : Chronic pain of right hip * : 03/31/2020 . * : Chronic systolic heart failure (HCC) * : 07/26/2015 * : Formatting of this note might be different from the original. revenkar EF 35% . * : CKD (chronic kidney disease) stage 4, GFR 15-29 ml/min (HCC) * : 10/07/2018 . * : Congestive heart failure (HCC) * : 07/26/2015 * : Last Assessment & Plan: Poor cardiac function could certainly be contributing to poor blood supply to the feet and toes as well. Formatting of this note might be different from the original. Formatting of this note might be different from the original. Last Assessment & Plan: Poor cardiac function could certainly be contributing to poor blood supply to the feet and toes as well. Last . * : Coronary artery disease * : . * : Diabetes mellitus without complication (HCC) * : . * : Dysphagia * : 07/26/2015 . * : Encounter for long-term (current) use of insulin  (HCC) * : 09/27/2020 . * : Erectile dysfunction * : 07/14/2017 . * : ESRD on dialysis (HCC) * : 11/15/2023 . * : Essential hypertension * : 02/24/2018 . * : Fever * : 11/07/2020 . * : Foot ulcer (HCC) * :  09/27/2020 * : left foot Formatting of this note might be different from the original. Formatting of this note might be different from the original. left foot . * : Foot ulcer, left (HCC) * : . * : GERD (gastroesophageal reflux disease) * : . * : Heart murmur * : 07/26/2015 . * : Hematuria * : 07/27/2015 . * : High risk medication use * : 07/26/2015 . * : History of marijuana use * : . * : Hyperkalemia * : 05/29/2020 . * : Hyperlipidemia * : 07/26/2015 . * : Hyperlipidemia associated with type 2 diabetes mellitus (HCC) * : 02/18/2020 . * : Hypertension * : . * : Hypertension associated with diabetes (HCC) * : . * : Hypoglycemia * : 05/07/2019 . * : Hypothyroidism * : 11/14/2015 . * : Hypothyroidism (acquired) * : 11/14/2015 . * : Insomnia * : 11/02/2018 . * : Kidney insufficiency * : . * : Lumbar spondylosis * : 03/20/2020 . * : Malaise and fatigue * : 03/25/2018 . * : Malnutrition of moderate degree * : 08/20/2023 . * : Mitral regurgitation * : 05/12/2019 . * : Moderate aortic regurgitation * : 05/12/2019 . * : Myocardial infarction due to demand ischemia [facility]) * : 09/26/2020 * : a.) Type II NSTEMI; b.) troponins were trended 0.54 --> 0.56 --> 0.52 ng/mL . * : Non-compliance * : 05/05/2019 . * : NSTEMI (non-ST elevated myocardial infarction) (HCC) * : 09/27/2020 . * : Osseous and subluxation stenosis of  intervertebral foramina of lumbar region * : 03/20/2020 . * : Osteoarthritis * : 10/06/2019 . * : Pancytopenia (HCC) * : 08/12/2023 . * : Peripheral vascular disease (HCC) * : . * : Personal history of tobacco use, presenting hazards to health * : 09/27/2020 . * : Polyneuropathy in diabetes (HCC) * : 05/07/2019 . * : Primary osteoarthritis involving multiple joints * : 10/06/2019 . * : Primary osteoarthritis of right hip * : 02/21/2020 . * : Pulmonary HTN (HCC) * : * : a.) TTE 05/12/2019: PASP 71; b.) TTE 09/27/2020: PASP >70; c.) TTE 03/21/2021: RVSP 43; d.) TTE 04/24/2022: RVSP 80.6 . * : Puncture wound of right  hip * : 04/02/2018 . * : PVD (peripheral vascular disease) with claudication (HCC) * : * : a.) s/p PTA 03/02/2018 - balloon angioplasty LEFT below knee popliteal artery; b.) s/p PTA 09/30/2022: baloon angioplasty LEFT tibioperoneal trunck, most proximal peroneal artery, and LEFT popliteal artery. . * : Regional wall motion abnormality of heart * : 05/07/2019 . * : Right groin pain * : 02/18/2020 . * : S/P CABG x 4 * : 2003 . * : Screening for colon cancer * : 02/18/2018 . * : Sepsis (HCC) * : 11/15/2023 . * : Sleep apnea * : 07/26/2015 * : a.) does not require nocturnal PAP therapy . * : Subacute osteomyelitis of left foot (HCC) * : 03/25/2018 . * : Thrombocytopenia (HCC) * : . * : Type 2 diabetes mellitus with hyperlipidemia (HCC) * : 07/26/2015 . * : Type 2 diabetes mellitus with stage 4 chronic kidney disease, with long-term current use of insulin  (HCC) * : 07/26/2015 . * : Type II diabetes mellitus with neurological manifestations (HCC) * : 02/18/2020 . * : Vitamin B12 deficiency * : 06/16/2019 . * : Vitamin D deficiency * : 05/07/2018 Past Surgical History: Procedure * : Laterality * : Date . * : AMPUTATION TOE * : Left * : 03/13/2018 * : Procedure: AMPUTATION TOE-MPJ; Surgeon: Ashley Soulier, DPM; Location: [facility] ORS; Service: Podiatry; Laterality: Left; . * : ANGIOPLASTY * : * : . * : AV FISTULA PLACEMENT * : Left * : 08/20/2023 * : Procedure: ARTERIOVENOUS (AV) FISTULA CREATION; Surgeon: Gretta Lonni PARAS, MD; Location: [facility] OR; Service: Vascular; Laterality: Left; . * : CARDIAC CATHETERIZATION * : * : . * : CORONARY ARTERY BYPASS GRAFT * : N/A * : 2003 . * : IR FLUORO GUIDE CV LINE RIGHT * : * : 08/18/2023 . * : IR PERC CHOLECYSTOSTOMY * : * : 11/12/2023 . * : IR US  GUIDE VASC ACCESS RIGHT * : * : 08/18/2023 . * : LAPAROSCOPIC APPENDECTOMY * : N/A * : 09/08/2018 * : Procedure: APPENDECTOMY LAPAROSCOPIC; Surgeon: Jordis Laneta FALCON, MD; Location: [facility] ORS; Service: General; Laterality: N/A; . * : LOWER EXTREMITY  ANGIOGRAPHY * : Left * : 03/02/2018 * : Procedure: LOWER EXTREMITY ANGIOGRAPHY; Surgeon: Marea Selinda RAMAN, MD; Location: [facility] INVASIVE CV LAB; Service: Cardiovascular; Laterality: Left; . * : LOWER EXTREMITY ANGIOGRAPHY * : Left * : 09/30/2022 * : Procedure: Lower Extremity Angiography; Surgeon: Marea Selinda RAMAN, MD; Location: [facility] INVASIVE CV LAB; Service: Cardiovascular; Laterality: Left; . * : LOWER EXTREMITY ANGIOGRAPHY * : Left * : 12/26/2023 * : Procedure: Lower Extremity Angiography; Surgeon: Marea Selinda RAMAN, MD; Location: [facility]; Service: Cardiovascular; Laterality: Left; . * : RIGHT/LEFT HEART CATH AND CORONARY/GRAFT ANGIOGRAPHY * : N/A * : 08/01/2023 * : Procedure: RIGHT/LEFT HEART CATH  AND CORONARY/GRAFT ANGIOGRAPHY; Surgeon: Thukkani, Arun K, MD; [facility]: [facility]; Service: Cardiovascular; Laterality: N/A; . * : ROTATOR CUFF REPAIR * : Left * : Allergies:Patient has no known allergies.Medications: Prior to Admission medications Medication * : Sig * : Start Date * : End Date * : Taking? * : Authorizing Provider acetaminophen  (TYLENOL ) 500 MG tablet * : Take 1,000 mg by mouth every 6 (six) hours as needed for moderate pain or headache. * : * : * : * : [provider] aspirin  EC 81 MG tablet * : Take 1 tablet (81 mg total) by mouth daily. Swallow whole. * : 07/25/23 * : * : * : Thukkani, Arun K, MD atorvastatin  (LIPITOR) 20 MG tablet * : Take 1 tablet (20 mg total) by mouth daily. * : 07/25/23 * : * : * : Thukkani, Arun K, MD carvedilol  (COREG ) 3.125 MG tablet * : Take 1 tablet (3.125 mg total) by mouth 2 (two) times daily. * : 05/20/23 * : 05/02/24 * : * : Carlin Delon BROCKS, NP clopidogrel  (PLAVIX ) 75 MG tablet * : Take 1 tablet (75 mg total) by mouth daily. * : 12/26/23 * : * : * : Dew, Selinda RAMAN, MD gabapentin  (NEURONTIN ) 300 MG capsule * : Take 1 capsule (300 mg total) by mouth at bedtime.Patient taking differently: Take 300 mg by mouth 2 (two) times daily. * : 08/25/23 * : * : * :  Arrien, Elidia Sieving, MD insulin  glargine (LANTUS ) 100 UNIT/ML injection * : Inject 7 Units into the skin daily as needed (Diabetes). Inject 7 units at bedtime as needed after checking glucose * : * : * : * : [provider] isosorbide  mononitrate (IMDUR ) 120 MG 24 hr tablet * : Take 1 tablet (120 mg total) by mouth daily. * : 08/27/23 * : * : * : Revankar, Jennifer SAUNDERS, MD latanoprost  (XALATAN ) 0.005 % ophthalmic solution * : Place 1 drop into both eyes at bedtime. * : 04/15/23 * : * : * : [provider] levothyroxine  (SYNTHROID ) 50 MCG tablet * : Take 50 mcg by mouth daily. * : 04/05/23 * : * : * : [provider] nitroGLYCERIN  (NITROSTAT ) 0.4 MG SL tablet * : Place 1 tablet (0.4 mg total) under the tongue every 5 (five) minutes as needed for chest pain. * : 11/20/20 * : * : * : Revankar, Jennifer SAUNDERS, MD sodium chloride  flush (NS) 0.9 % SOLN * : Inject 5 mLs into the vein daily for 15 days. flush drain daily with 5 cc NS, record output daily, dressing changes every 2-3 days or earlier if soiled * : 11/14/23 * : 12/15/23 * : * : Darci Pore, MD tamsulosin  (FLOMAX ) 0.4 MG CAPS capsule * : Take 0.4 mg by mouth daily after breakfast. * : 04/19/20 * : * : * : [provider] vitamin B-12 (CYANOCOBALAMIN ) 500 MCG tablet * : Take 500 mcg by mouth daily. * : * : * : * : [provider] Vitamin D, Ergocalciferol, (DRISDOL) 1.25 MG (50000 UNIT) CAPS capsule * : Take 50,000 Units by mouth once a week. Mondays * : 07/13/21 * : * : * : [provider] Family History Problem * : Relation * : Age of Onset . * : Hyperlipidemia * : Mother * : . * : Hypertension * : Mother * : . * : Diabetes * : Mother * : . * : Heart attack * : Brother * : . * :  Heart disease * : Brother * : . * : Lung disease * : Father * : Socioeconomic History . * : Marital status: * : Married * : * : Spouse name: * : Not on file . * : Number of children: * : Not on file . * : Years of education: * : Not on  file . * : Highest education level: * : Not on file Occupational History . * : Not on file Tobacco Use . * : Smoking status: * : Former * : * : Current packs/day: * : 0.00 * : * : Average packs/day: * : 0.5 packs/day for 25.0 years (12.5 ttl pk-yrs) * : * : Types: * : Cigarettes * : * : Start date: * : 94 * : * : Quit date: * : 2003 * : * : Years since quitting: * : 22.5 . * : Smokeless tobacco: * : Never Vaping Use . * : Vaping status: * : Never Used Substance and Sexual Activity . * : Alcohol use: * : Not Currently * : * : Comment: 2003 . * : Drug use: * : Never . * : Sexual activity: * : Not on file Other Topics * : Concern . * : Not on file Social History Narrative . * : Not on file Financial Resource Strain: Low Risk  (09/26/2020) * : Received from [facility] * : Overall Financial Resource Strain (CARDIA) * : . * : Difficulty of Paying Living Expenses: Not hard at all Food Insecurity: No Food Insecurity (11/12/2023) * : Hunger Vital Sign * : . * : Worried About Running Out of Food in the Last Year: Never true * : . * : Ran Out of Food in the Last Year: Never true Transportation Needs: No Transportation Needs (11/12/2023) * : PRAPARE - Transportation * : . * : Lack of Transportation (Medical): No * : . * : Lack of Transportation (Non-Medical): No Physical Activity: Inactive (09/26/2020) * : Received from [facility] * : Exercise Vital Sign * : . * : On average, how many days per week do you engage in moderate to strenuous exercise (like a brisk walk)?: 0 days * : . * : On average, how many minutes do you engage in exercise at this level?: 0 min Stress: No Stress Concern Present (09/26/2020) * : Received from [facility] * : [facility] of Occupational Health - Occupational Stress Questionnaire * : . * : Feeling of Stress : Not at all Social Connections: Moderately Integrated (11/12/2023) * : Social Connection and Isolation Panel * : . * : Frequency of Communication with Friends and Family: Three  times a week * : . * : Frequency of Social Gatherings with Friends and Family: Once a week * : . * : Attends Religious Services: 1 to 4 times per year * : . * : Active Member of Clubs or Organizations: No * : . * : Attends Club or Organization Meetings: Never * : . * : Marital Status: Married Review of Systems: A 12 point ROS discussed and pertinent positives are indicated in the HPI above. All other systems are negative.Physical ExamConstitutional: Appearance: Normal appearance. Abdominal: General: Abdomen is flat. Palpations: Abdomen is soft. Comments: RUQ drain appropriately dressed. Dressing is clean, dry, intact.Drain incision site non-tender, without evidence of infection. Retaining suture and Stat Lock in place. Good bilious output in collection bag. Neurological: Mental Status: He is alert. Imaging: PERIPHERAL VASCULAR CATHETERIZATIONResult Date:  7/25/2025See surgical note for result. VAS US  ABI WITH/WO TBIResult Date: 12/23/2023 LOWER EXTREMITY DOPPLER STUDY Patient Name: CACHE BILLS Date of Exam: 12/22/2023 Medical Rec #: [id number] Accession #: [id number] Date of Birth: 07-01-1955 Patient Gender: M Patient Age: 65 years Exam Location: [facility] Procedure: VAS US  ABI WITH/WO TBI Referring Phys: -------------------------------------------------------------------------------- Indications: Peripheral artery disease. Vascular Interventions: 09/30/2022 Percutaneous transluminal angioplasty of left tibioperoneal trunk and most proximal peroneal artery with 4 mm diameter by 6 cm length Lutonix drug-coated angioplasty balloon 5. Percutaneous transluminal angioplasty of left popliteal artery with 6 mm diameter by 8 cm length Lutonix drug-coated angioplasty balloon. Comparison Study: 09/2022 Performing Technologist: Jerel Croak RVT Examination Guidelines: A complete evaluation includes at minimum, Doppler waveform signals and systolic blood pressure reading at the level of bilateral brachial,  anterior tibial, and posterior tibial arteries, when vessel segments are accessible. Bilateral testing is considered an integral part of a complete examination. Photoelectric Plethysmograph (PPG) waveforms and toe systolic pressure readings are included as required and additional duplex testing as needed. Limited examinations for reoccurring indications may be performed as noted. ABI Findings: +---------+------------------+-----+----------+--------+ Right Rt Pressure (mmHg)IndexWaveform Comment  +---------+------------------+-----+----------+--------+ Brachial 118     +---------+------------------+-----+----------+--------+ ATA   monophasicNonComp  +---------+------------------+-----+----------+--------+ PTA   monophasicNonComp  +---------+------------------+-----+----------+--------+ Great Toe50 0.42 Abnormal   +---------+------------------+-----+----------+--------+ +---------+------------------+-----+--------+-------+ Left Lt Pressure (mmHg)IndexWaveformComment +---------+------------------+-----+--------+-------+ Brachial 120     +---------+------------------+-----+--------+-------+ ATA   biphasicNonComp +---------+------------------+-----+--------+-------+ PTA   biphasicNonComp +---------+------------------+-----+--------+-------+ Great Toe82 0.68 Abnormal  +---------+------------------+-----+--------+-------+ +-------+-----------+-------------+------------+------------+ ABI/TBIToday's ABIToday's TBI Previous ABIPrevious TBI +-------+-----------+-------------+------------+------------+ Right NonComp .42 NonComp .50  +-------+-----------+-------------+------------+------------+ Left NonComp .68 (2nd toe)NonComp Amp  +-------+-----------+-------------+------------+------------+ Bilateral ABIs appear essentially unchanged compared to prior study on 09/2022. Summary: Right: Resting right ankle-brachial index indicates noncompressible right  lower extremity arteries. The right toe-brachial index is abnormal. Left: Resting left ankle-brachial index indicates noncompressible left lower extremity arteries. The left toe-brachial index is abnormal. Great toe amp; 2nd toe near normal TBI. *See table(s) above for measurements and observations. Electronically signed by Cordella Shawl MD on 12/23/2023 at 7:24:13 AM. Final Labs:CBC: Recent Labs 06/10/25193006/11/25063106/12/25065206/13/250552 WBC12.1*8.16.36.8HGB12.8*10.8*11.6*11.4*HCT38.8*32.9*36.8*35.1*PLT81*76*85*95* COAGS: Recent Labs 03/16/25022806/10/25193006/11/251011 INR1.4*1.4*1.3* BMP: Recent Labs 06/10/25193006/11/25063106/12/25065206/13/250552 WJ865*865*864864 K3.83.74.74.1CL94*96*94*95*[id number] GLUCOSE148*85175*112*BUN31*35*61*35*CALCIUM8.1*7.8*8.0*8.4*CREATININE4.93*5.52*7.54*5.48*GFRNONAA12*11*7*11* LIVER FUNCTION TESTS: Recent Labs 03/24/25031606/10/25193006/11/25063106/12/250821 BILITOT -- 4.1*4.8*2.4*AST -- 54*49*187*ALT -- 51*4297*ALKPHOS -- 841*865*876EMNU -- 8.07.08.1ALBUMIN3.63.2*2.7*2.7* TUMOR MARKERS: No results for input(s): AFPTM, CEA, CA199, CHROMGRNA in the last 8760 hours. Assessment and Plan:Acute cholecystitis s/p percutaneous cholecystostomy drain placement on 6/11. *Patient is well overall, with expected discomfort from drain. *Good daily bilious output into drainage bag. *Patient may flush drain every 3 days, or sooner with concerns of decreased output or clogged drain. *Patient is currently undergoing extensive cardiac evaluation and is pending TAVR procedure. He will have to obtain clearance by his cardiac team prior to undergoing potential cholecystectomy. *Keep follow-up appointment with Surgery service on 8/11. *IR will see patient in clinic every 6-8 weeks for regular drain exchanges until he is cleared for cholecystectomy, or alternate plan by care team. *Drain was exchanged today by Dr. Johann. Return in 6-8 weeks, on a recurring basis, for drain exchanges. *Return  to clinic precautions given to patient and his wife. *Both patient and his wife voiced understanding and are amenable to this plan. Electronically Signed:Charles A Carim PA-C7/30/2025, 5:56 PM Electronically Signed   By: JONETTA Johann M.D.   On: 01/01/2024 11:10   IR Catheter Tube Change Result Date: 01/01/2024 INDICATION: Acute calculus cholecystitis, status post cholecystostomy catheter placement 11/12/2023, presents for scheduled exchange. No complicating features. EXAM: PERCUTANEOUS CHOLECYSTOSTOMY CATHETER EXCHANGE UNDER FLUOROSCOPY MEDICATIONS: No periprocedural antibiotics  were indicated ANESTHESIA/SEDATION: Lidocaine  1% subcutaneous FLUOROSCOPY: Radiation Exposure Index (as provided by the fluoroscopic device): 22 mGy Kerma COMPLICATIONS: None immediate. PROCEDURE: Informed written consent was obtained from the patient after a thorough discussion of the procedural risks, benefits and alternatives. All questions were addressed. Maximal Sterile Barrier Technique was utilized including caps, mask, sterile gowns, sterile gloves, sterile drape, hand hygiene and skin antiseptic. A timeout was performed prior to the initiation of the procedure. Small contrast injection through the existing catheter confirmed placement within the gallbladder lumen. Catheter cut and exchanged over an angled Glidewire. A new 10 French per pigtail catheter was advanced and positioned centrally in the gallbladder. Contrast injection confirms good positioning. Catheter secured externally 0 Prolene suture and placed to external drain bag. The patient tolerated the procedure well. IMPRESSION: 1. Technically successful exchange of cholecystostomy catheter under fluoroscopy. Electronically Signed   By: JONETTA Faes M.D.   On: 01/01/2024 11:08   PERIPHERAL VASCULAR CATHETERIZATION Result Date: 12/26/2023 See surgical note for result.   There are no new results to review at this time.  Previous records (including but not limited to H&P,  progress notes, nursing notes, TOC management) were reviewed in assessment of this patient.  Labs: CBC: Recent Labs  Lab 01/21/24 0331 01/22/24 0347 01/23/24 0418 01/24/24 0353 01/25/24 0329  WBC 11.0* 12.6* 13.3* 12.2* 13.8*  HGB 11.2* 9.8* 10.6* 10.8* 10.1*  HCT 35.0* 30.1* 32.9* 33.7* 31.8*  MCV 94.9 94.1 93.5 93.9 94.6  PLT 197 169 178 178 188   Basic Metabolic Panel: Recent Labs  Lab 01/19/24 0442 01/19/24 1844 01/21/24 0331 01/22/24 0347 01/23/24 0418 01/24/24 0353 01/25/24 0329  NA 133*   < > 131* 132* 133* 134* 135  K 4.2   < > 4.8 5.3* 4.8 4.7 4.6  CL 93*   < > 92* 90* 92* 92* 94*  CO2 25   < > 22 21* 23 22 22   GLUCOSE 129*   < > 109* 120* 129* 122* 97  BUN 43*   < > 72* 96* 61* 78* 46*  CREATININE 7.16*   < > 10.74* 12.08* 8.48* 10.31* 7.03*  CALCIUM  8.7*   < > 8.5* 8.2* 9.1 8.7* 8.8*  MG 2.1  --  2.2  --   --   --   --    < > = values in this interval not displayed.   Liver Function Tests: Recent Labs  Lab 01/19/24 1844  AST 22  ALT <5  ALKPHOS 161*  BILITOT 0.8  PROT 9.4*  ALBUMIN  2.5*   CBG: Recent Labs  Lab 01/24/24 1430 01/24/24 1615 01/24/24 2109 01/25/24 0610 01/25/24 0827  GLUCAP 102* 109* 95 98 108*    Scheduled Meds:  (feeding supplement) PROSource Plus  30 mL Oral BID BM   aspirin  EC  81 mg Oral Daily   atorvastatin   20 mg Oral Daily   carvedilol   3.125 mg Oral BID   Chlorhexidine  Gluconate Cloth  6 each Topical Q0600   Chlorhexidine  Gluconate Cloth  6 each Topical Q0600   clopidogrel   75 mg Oral Daily   heparin   5,000 Units Subcutaneous Q8H   insulin  aspart  0-6 Units Subcutaneous TID WC   isosorbide  mononitrate  120 mg Oral Daily   levothyroxine   50 mcg Oral Daily   sevelamer  carbonate  800 mg Oral TID WC   sodium chloride  flush  3 mL Intravenous Q12H   tamsulosin   0.4 mg Oral QPC breakfast   Continuous Infusions:  sodium chloride      norepinephrine  (LEVOPHED ) Adult infusion     PRN Meds:.sodium chloride ,  acetaminophen  **OR** acetaminophen , albuterol , bisacodyl , bisacodyl , HYDROmorphone  (DILAUDID ) injection, menthol -cetylpyridinium, nitroGLYCERIN , oxyCODONE , polyethylene glycol, sodium chloride  flush  Family Communication: None at bedside  Disposition: Status is: Inpatient Remains inpatient appropriate because: TMA, ESRD     Time spent: 37 minutes  Length of inpatient stay: 11 days  Author: Carliss LELON Canales, DO 01/25/2024 11:49 AM  For on call review [email or web address].

## 2024-01-25 NOTE — Progress Notes (Signed)
 [facility] Progress Note   Subjective:  Seen in room. Hypotensive during HD yesterday. L foot pain overnight. Appears will need further amputation.   Objective Vitals:   01/24/24 2359 01/25/24 0447 01/25/24 0500 01/25/24 0754  BP: 111/66 98/63  104/60  Pulse: 85 92    Resp: 18 18  16   Temp: 98.3 F (36.8 C) 98.5 F (36.9 C)  98.4 F (36.9 C)  TempSrc: Oral Oral  Oral  SpO2: 100% 96%    Weight:   69.3 kg   Height:       Physical Exam General: Alert, sitting up in bed, nad  Heart: RRR Lungs: Clear  Abdomen: soft, NTND Extremities: no LE edema, L TMA, necrosis at incision  Dialysis Access: LU AVF +b/t   Filed Weights   01/23/24 0308 01/24/24 1003 01/25/24 0500  Weight: 74.9 kg 73 kg 69.3 kg    Intake/Output Summary (Last 24 hours) at 01/25/2024 1038 Last data filed at 01/24/2024 1341 Gross per 24 hour  Intake --  Output 0 ml  Net 0 ml     Additional Objective Labs: Basic Metabolic Panel: Recent Labs  Lab 01/23/24 0418 01/24/24 0353 01/25/24 0329  NA 133* 134* 135  K 4.8 4.7 4.6  CL 92* 92* 94*  CO2 23 22 22   GLUCOSE 129* 122* 97  BUN 61* 78* 46*  CREATININE 8.48* 10.31* 7.03*  CALCIUM  9.1 8.7* 8.8*   Liver Function Tests: Recent Labs  Lab 01/19/24 1844  AST 22  ALT <5  ALKPHOS 161*  BILITOT 0.8  PROT 9.4*  ALBUMIN  2.5*   CBC: Recent Labs  Lab 01/21/24 0331 01/22/24 0347 01/23/24 0418 01/24/24 0353 01/25/24 0329  WBC 11.0* 12.6* 13.3* 12.2* 13.8*  HGB 11.2* 9.8* 10.6* 10.8* 10.1*  HCT 35.0* 30.1* 32.9* 33.7* 31.8*  MCV 94.9 94.1 93.5 93.9 94.6  PLT 197 169 178 178 188   CBG: Recent Labs  Lab 01/24/24 1430 01/24/24 1615 01/24/24 2109 01/25/24 0610 01/25/24 0827  GLUCAP 102* 109* 95 98 108*    Medications:  sodium chloride      norepinephrine  (LEVOPHED ) Adult infusion      (feeding supplement) PROSource Plus  30 mL Oral BID BM   aspirin  EC  81 mg Oral Daily   atorvastatin   20 mg Oral Daily   carvedilol    3.125 mg Oral BID   Chlorhexidine  Gluconate Cloth  6 each Topical Q0600   Chlorhexidine  Gluconate Cloth  6 each Topical Q0600   clopidogrel   75 mg Oral Daily   heparin   5,000 Units Subcutaneous Q8H   insulin  aspart  0-6 Units Subcutaneous TID WC   isosorbide  mononitrate  120 mg Oral Daily   levothyroxine   50 mcg Oral Daily   sevelamer  carbonate  800 mg Oral TID WC   sodium chloride  flush  3 mL Intravenous Q12H   tamsulosin   0.4 mg Oral QPC breakfast    Dialysis Orders: [facility] TTS - No heparin  - Left AVF (in use for 2 weeks) - EDW 81 kg - will need EDW lowered at d/c 4 hr 350/800 2K/2.5 Ca Bicarb 39 Mircera: 50 mcg ordered - but has not received any ESA yet No VDRA Renvela  800 mg TID WC   Assessment/Plan: PAD/ Left third toe gangrene: 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 Then TMA completed on 01/17/24 by Dr. Serene.  Plans per [facility].  Severe Aortic Stenosis: s/p TAVR 01/20/24 Cardiology following. Zio monitor in place.  ESRD:  Usual HD TTS.  Next HD Tues.  Hypertension/volume: BP in goal. On carvedilol  3.125mg  BID - hold in AM on HD days. Well below EDW now. Minimal UF with HD.  Chronic HFrEF: Appears euvolemic.   Recent acute cholecystitis requiring cholecystostomy drain 6/25: drain is in place.  Anemia: Hgb 10-11.  No indication for ESA at this time.  Metabolic bone disease: restarted home Sevelamer  800 TID WC. Check phos. Not on VDRA at his OP clinic. Continue home binder DMT2: Insulin  per primary.  Nutrition:  Albumin  low. Cont prot supp. On renal diet with fluid restriction  Maisie Ronnald Acosta PA-C [facility] 01/25/2024,10:38 AM

## 2024-01-25 NOTE — Progress Notes (Addendum)
  Progress Note    01/25/2024 9:38 AM 5 Days Post-Op  Subjective:  pain L TMA   Vitals:   01/25/24 0447 01/25/24 0754  BP: 98/63 104/60  Pulse: 92   Resp: 18 16  Temp: 98.5 F (36.9 C) 98.4 F (36.9 C)  SpO2: 96%    Physical Exam: Lungs:  non labored Incisions:  L TMA necrotic Neurologic: A&O  CBC    Component Value Date/Time   WBC 13.8 (H) 01/25/2024 0329   RBC 3.36 (L) 01/25/2024 0329   HGB 10.1 (L) 01/25/2024 0329   HGB 11.5 (L) 05/16/2023 1429   HCT 31.8 (L) 01/25/2024 0329   HCT 35.3 (L) 05/16/2023 1429   PLT 188 01/25/2024 0329   PLT 144 (L) 05/16/2023 1429   MCV 94.6 01/25/2024 0329   MCV 90 05/16/2023 1429   MCH 30.1 01/25/2024 0329   MCHC 31.8 01/25/2024 0329   RDW 15.0 01/25/2024 0329   RDW 13.5 05/16/2023 1429   LYMPHSABS 0.7 11/11/2023 1930   LYMPHSABS 0.7 05/16/2023 1429   MONOABS 0.8 11/11/2023 1930   EOSABS 0.0 11/11/2023 1930   EOSABS 0.1 05/16/2023 1429   BASOSABS 0.0 11/11/2023 1930   BASOSABS 0.0 05/16/2023 1429    BMET    Component Value Date/Time   NA 135 01/25/2024 0329   NA 145 (H) 07/14/2023 1412   NA 136 08/30/2013 1025   K 4.6 01/25/2024 0329   K 3.7 08/30/2013 1025   CL 94 (L) 01/25/2024 0329   CL 101 08/30/2013 1025   CO2 22 01/25/2024 0329   CO2 31 08/30/2013 1025   GLUCOSE 97 01/25/2024 0329   GLUCOSE 310 (H) 08/30/2013 1025   BUN 46 (H) 01/25/2024 0329   BUN 97 (HH) 07/14/2023 1412   BUN 17 08/30/2013 1025   CREATININE 7.03 (H) 01/25/2024 0329   CREATININE 1.15 08/30/2013 1025   CALCIUM  8.8 (L) 01/25/2024 0329   CALCIUM  9.4 08/30/2013 1025   GFRNONAA 8 (L) 01/25/2024 0329   GFRNONAA >60 08/30/2013 1025   GFRAA 27 (L) 02/22/2020 0539   GFRAA >60 08/30/2013 1025    INR    Component Value Date/Time   INR 1.1 01/19/2024 1844     Intake/Output Summary (Last 24 hours) at 01/25/2024 9061 Last data filed at 01/24/2024 1341 Gross per 24 hour  Intake --  Output 0 ml  Net 0 ml     Assessment/Plan:  68 y.o.  male is s/p L TMA 5 Days Post-Op   TMA appears necrotic.  Patient is aware he will require leg amputation.  SFA and popliteal were patent on angiography in July.  Dr. Serene will discuss level of amputation with the patient.   Phillip Sender, PA-C [facility] [phone] 01/25/2024 9:38 AM  I agree with the above.  Will continue to monitor TMA site but remains at risk for more proximal amputation  Phillip [name]

## 2024-01-25 NOTE — Plan of Care (Signed)

## 2024-01-26 ENCOUNTER — Telehealth (HOSPITAL_COMMUNITY): Payer: Self-pay

## 2024-01-26 DIAGNOSIS — D638 Anemia in other chronic diseases classified elsewhere: Secondary | ICD-10-CM | POA: Diagnosis not present

## 2024-01-26 DIAGNOSIS — I5042 Chronic combined systolic (congestive) and diastolic (congestive) heart failure: Secondary | ICD-10-CM | POA: Diagnosis not present

## 2024-01-26 DIAGNOSIS — Z952 Presence of prosthetic heart valve: Secondary | ICD-10-CM | POA: Diagnosis not present

## 2024-01-26 DIAGNOSIS — I2510 Atherosclerotic heart disease of native coronary artery without angina pectoris: Secondary | ICD-10-CM | POA: Diagnosis not present

## 2024-01-26 LAB — COMPREHENSIVE METABOLIC PANEL WITH GFR
ALT: <5 U/L (ref 0–44)
AST: 17 U/L (ref 15–41)
Albumin: 2.4 g/dL — ABNORMAL LOW (ref 3.5–5.0)
Alkaline Phosphatase: 96 U/L (ref 38–126)
Anion gap: 15 (ref 5–15)
BUN: 64 mg/dL — ABNORMAL HIGH (ref 8–23)
CO2: 25 mmol/L (ref 22–32)
Calcium: 8.9 mg/dL (ref 8.9–10.3)
Chloride: 94 mmol/L — ABNORMAL LOW (ref 98–111)
Creatinine, Ser: 9 mg/dL — ABNORMAL HIGH (ref 0.61–1.24)
GFR, Estimated: 6 mL/min — ABNORMAL LOW (ref >60)
Glucose, Bld: 145 mg/dL — ABNORMAL HIGH (ref 70–99)
Potassium: 4.4 mmol/L (ref 3.5–5.1)
Sodium: 134 mmol/L — ABNORMAL LOW (ref 135–145)
Total Bilirubin: 0.8 mg/dL (ref 0.0–1.2)
Total Protein: 8.4 g/dL — ABNORMAL HIGH (ref 6.5–8.1)

## 2024-01-26 LAB — CBC
HCT: 30.1 % — ABNORMAL LOW (ref 39.0–52.0)
Hemoglobin: 9.8 g/dL — ABNORMAL LOW (ref 13.0–17.0)
MCH: 30.6 pg (ref 26.0–34.0)
MCHC: 32.6 g/dL (ref 30.0–36.0)
MCV: 94.1 fL (ref 80.0–100.0)
Platelets: 170 K/uL (ref 150–400)
RBC: 3.2 MIL/uL — ABNORMAL LOW (ref 4.22–5.81)
RDW: 15.1 % (ref 11.5–15.5)
WBC: 11.8 K/uL — ABNORMAL HIGH (ref 4.0–10.5)
nRBC: 0 % (ref 0.0–0.2)

## 2024-01-26 LAB — GLUCOSE, CAPILLARY
Glucose-Capillary: 127 mg/dL — ABNORMAL HIGH (ref 70–99)
Glucose-Capillary: 190 mg/dL — ABNORMAL HIGH (ref 70–99)
Glucose-Capillary: 122 mg/dL — ABNORMAL HIGH (ref 70–99)
Glucose-Capillary: 92 mg/dL (ref 70–99)

## 2024-01-26 LAB — MAGNESIUM: Magnesium: 2.3 mg/dL (ref 1.7–2.4)

## 2024-01-26 NOTE — Progress Notes (Signed)
 Occupational Therapy Treatment Patient Details Name: Phillip [name] MRN: [id number] DOB: [birth year] Today's Date: 01/26/2024   History of present illness ALFONS SULKOWSKI is a 68 y.o. male admitted 01/14/24 for left third toe gangrene. Pt s/p left third toe amputation including met head 8/13 and left TMA with wound vac application 8/15. Currently s/p TAVR on 8/19. PMHx: T1DM, PAD s/p angioplasty/PCI to left tibioperoneal trunk on 7/25, ESRD on HD TTS, HTN, chronic HFrEF, severe aortic stenosis, acute cholecystitis requiring cholecystostomy drain 6/25   OT comments  Pt progressing toward established OT goals. Challenging activity tolerance, safety, transfers, and return to ADL this session with transfer training and LB ADL. Pt needing mod assist for LB Adl and min A for transfers with up to mod cues for step pattern. Pt reports significant pain at abdominal drain today. VSS during session. Pt continues to present with decreased strength, balance, safety, processing speed, and problem solving. Due to significant change in functional status, recommending intensive multidisciplinary rehabilitation >3 hours/day to optimize safety and independence in ADL.        If plan is discharge home, recommend the following:  A lot of help with walking and/or transfers;A lot of help with bathing/dressing/bathroom   Equipment Recommendations  Other (comment) (defer)    Recommendations for Other Services      Precautions / Restrictions Precautions Precautions: Fall Recall of Precautions/Restrictions: Intact Precaution/Restrictions Comments: R abdominal drain Required Braces or Orthoses: Other Brace Other Brace: Darco wedge shoe LLE Restrictions Weight Bearing Restrictions Per Provider Order: Yes Other Position/Activity Restrictions: WB through heel in darco shoe       Mobility Bed Mobility               General bed mobility comments: in recliner    Transfers Overall transfer level: Needs  assistance Equipment used: Rolling walker (2 wheels) Transfers: Sit to/from Stand Sit to Stand: Min assist     Step pivot transfers: Min assist     General transfer comment: cues for safety, hand placement for STS and for step pattern/LLE placement during pivot as well as steps forward/back.     Balance Overall balance assessment: Needs assistance Sitting-balance support: No upper extremity supported, Feet supported Sitting balance-Leahy Scale: Good     Standing balance support: Bilateral upper extremity supported, During functional activity, Reliant on assistive device for balance Standing balance-Leahy Scale: Poor Standing balance comment: Pt dependent on RW                           ADL either performed or assessed with clinical judgement   ADL Overall ADL's : Needs assistance/impaired     Grooming: Set up;Sitting               Lower Body Dressing: Moderate assistance;Sitting/lateral leans Lower Body Dressing Details (indicate cue type and reason): to don sock R foot; darco L foot Toilet Transfer: Minimal assistance;Rolling walker (2 wheels);Stand-pivot;BSC/3in1 Statistician Details (indicate cue type and reason): dense cues for LLE placement with steps and pt with tendency to point toe to floor despite use of darco shoe         Functional mobility during ADLs: Minimal assistance;Rolling walker (2 wheels);Cueing for safety;Cueing for sequencing      Extremity/Trunk Assessment Upper Extremity Assessment Upper Extremity Assessment: Generalized weakness   Lower Extremity Assessment Lower Extremity Assessment: Defer to PT evaluation        Vision   Vision Assessment?: No apparent visual  deficits   Perception     Praxis     Communication Communication Communication: No apparent difficulties   Cognition Arousal: Alert Behavior During Therapy: WFL for tasks assessed/performed Cognition: Cognition impaired     Awareness: Online awareness  impaired Memory impairment (select all impairments): Non-declarative long-term memory, Declarative long-term memory   Executive functioning impairment (select all impairments): Problem solving, Sequencing OT - Cognition Comments: Delayed processing speed; cues for initiation of tasks and problem solving in light of current physical ability                 Following commands: Intact        Cueing   Cueing Techniques: Verbal cues  Exercises      Shoulder Instructions       General Comments      Pertinent Vitals/ Pain       Pain Assessment Pain Assessment: Faces Faces Pain Scale: Hurts even more Pain Location: abdomen Pain Descriptors / Indicators: Aching, Operative site guarding Pain Intervention(s): Limited activity within patient's tolerance, Monitored during session  Home Living                                          Prior Functioning/Environment              Frequency  Min 2X/week        Progress Toward Goals  OT Goals(current goals can now be found in the care plan section)  Progress towards OT goals: Progressing toward goals  Acute Rehab OT Goals Patient Stated Goal: get better OT Goal Formulation: With patient Time For Goal Achievement: 02/05/24 Potential to Achieve Goals: Good ADL Goals Pt Will Perform Lower Body Dressing: with contact guard assist;sit to/from stand Pt Will Transfer to Toilet: with contact guard assist;ambulating;regular height toilet Pt Will Perform Toileting - Clothing Manipulation and hygiene: with contact guard assist;sit to/from stand;sitting/lateral leans  Plan      Co-evaluation                 AM-PAC OT 6 Clicks Daily Activity     Outcome Measure   Help from another person eating meals?: None Help from another person taking care of personal grooming?: A Little Help from another person toileting, which includes using toliet, bedpan, or urinal?: A Lot Help from another person bathing  (including washing, rinsing, drying)?: A Lot Help from another person to put on and taking off regular upper body clothing?: A Little Help from another person to put on and taking off regular lower body clothing?: A Lot 6 Click Score: 16    End of Session Equipment Utilized During Treatment: Rolling walker (2 wheels);Gait belt  OT Visit Diagnosis: Unsteadiness on feet (R26.81);Other abnormalities of gait and mobility (R26.89);Muscle weakness (generalized) (M62.81)   Activity Tolerance Patient tolerated treatment well;Patient limited by pain   Patient Left in chair;with call bell/phone within reach;with chair alarm set   Nurse Communication Mobility status        Time: 9047-8987 OT Time Calculation (min): 20 min  Charges: OT General Charges $OT Visit: 1 Visit OT Treatments $Self Care/Home Management : 8-22 mins  Elma JONETTA Lebron FREDERICK, OTR/L [facility] Acute Rehabilitation Office: [phone]   Elma JONETTA Lebron 01/26/2024, 11:23 AM

## 2024-01-26 NOTE — Progress Notes (Signed)
 IP rehab admissions - Noted patient likely to need a BKA or AKA.  I will hold off on inpatient rehab admission until all tests and procedures are completed and patient is medically stable.  [phone]

## 2024-01-26 NOTE — Progress Notes (Signed)
 [facility] Progress Note   Subjective:  Seen in room. Up in recliner having breakfast, better spirits today.    Objective Vitals:   01/25/24 1939 01/26/24 0002 01/26/24 0256 01/26/24 0821  BP: 92/75 104/66 113/69 (!) 82/54  Pulse: 86 87 79 95  Resp: 20 16 16 16   Temp: 98.1 F (36.7 C) 98.5 F (36.9 C) 97.7 F (36.5 C) 97.6 F (36.4 C)  TempSrc: Oral Oral Oral Oral  SpO2: 98%   98%  Weight:   76 kg   Height:       Physical Exam General: Alert, sitting up in bed, nad  Heart: RRR Lungs: Clear  Abdomen: soft, NTND Extremities: no LE edema, L TMA, necrosis at incision  Dialysis Access: LU AVF +b/t   Filed Weights   01/24/24 1003 01/25/24 0500 01/26/24 0256  Weight: 73 kg 69.3 kg 76 kg   No intake or output data in the 24 hours ending 01/26/24 0858    Additional Objective Labs: Basic Metabolic Panel: Recent Labs  Lab 01/24/24 0353 01/25/24 0329 01/26/24 0328  NA 134* 135 134*  K 4.7 4.6 4.4  CL 92* 94* 94*  CO2 22 22 25   GLUCOSE 122* 97 145*  BUN 78* 46* 64*  CREATININE 10.31* 7.03* 9.00*  CALCIUM  8.7* 8.8* 8.9   Liver Function Tests: Recent Labs  Lab 01/19/24 1844 01/26/24 0328  AST 22 17  ALT <5 <5  ALKPHOS 161* 96  BILITOT 0.8 0.8  PROT 9.4* 8.4*  ALBUMIN  2.5* 2.4*   CBC: Recent Labs  Lab 01/22/24 0347 01/23/24 0418 01/24/24 0353 01/25/24 0329 01/26/24 0328  WBC 12.6* 13.3* 12.2* 13.8* 11.8*  HGB 9.8* 10.6* 10.8* 10.1* 9.8*  HCT 30.1* 32.9* 33.7* 31.8* 30.1*  MCV 94.1 93.5 93.9 94.6 94.1  PLT 169 178 178 188 170   CBG: Recent Labs  Lab 01/25/24 0827 01/25/24 1204 01/25/24 1623 01/25/24 2116 01/26/24 0603  GLUCAP 108* 127* 139* 149* 127*    Medications:  sodium chloride      norepinephrine  (LEVOPHED ) Adult infusion      (feeding supplement) PROSource Plus  30 mL Oral BID BM   aspirin  EC  81 mg Oral Daily   atorvastatin   20 mg Oral Daily   carvedilol   3.125 mg Oral BID   Chlorhexidine  Gluconate Cloth  6  each Topical Q0600   Chlorhexidine  Gluconate Cloth  6 each Topical Q0600   clopidogrel   75 mg Oral Daily   heparin   5,000 Units Subcutaneous Q8H   insulin  aspart  0-6 Units Subcutaneous TID WC   isosorbide  mononitrate  120 mg Oral Daily   levothyroxine   50 mcg Oral Daily   sevelamer  carbonate  800 mg Oral TID WC   sodium chloride  flush  3 mL Intravenous Q12H   tamsulosin   0.4 mg Oral QPC breakfast    Dialysis Orders: [place] HD TTS - No heparin  - Left AVF (in use for 2 weeks) - EDW 81 kg - will need EDW lowered at d/c 4 hr 350/800 2K/2.5 Ca Bicarb 39 Mircera: 50 mcg ordered - but has not received any ESA yet No VDRA Renvela  800 mg TID WC   Assessment/Plan: PAD/ Left third toe gangrene: 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 Then TMA completed on 01/17/24 by Dr. Serene.  Plans per [facility].  Severe Aortic Stenosis: s/p TAVR 01/20/24 Cardiology following. Zio monitor in place.  ESRD:  Usual HD TTS. Next HD Tues.  Hypertension/volume: BP in goal. On carvedilol  3.125mg   BID - hold in AM on HD days. Well below EDW now. Minimal UF with HD.  Chronic HFrEF: Appears euvolemic.   Recent acute cholecystitis requiring cholecystostomy drain 6/25: drain is in place.  Anemia: Hgb 10-11.  No indication for ESA at this time.  Metabolic bone disease: restarted home Sevelamer  800 TID WC. Check phos. Not on VDRA at his OP clinic. Continue home binder DMT2: Insulin  per primary.  Nutrition:  Albumin  low. Cont prot supp. On renal diet with fluid restriction  Maisie Ronnald Acosta PA-C  [facility] 01/26/2024,8:58 AM

## 2024-01-26 NOTE — Progress Notes (Signed)
 Progress Note   Patient: Phillip [name] FMW:[id number] DOB: [birth year] DOA: 01/14/2024  DOS: the patient was seen and examined on 01/26/2024   Brief hospital course:  68/M w ESRD on HD TTS, DM-1, recent acute cholecystitis June 2025) requiring cholecystostomy tube placement, chronic HFrEF, aortic stenosis (TAVR postponed due to left third toe gangrene), PAD-s/p PCI to left tibia-fibula/angioplasty on 7/25 who was admitted by vascular surgery 8/13 for an elective left third toe amputation. H/o significant issues with nonhealing ulceration of his left foot-progressing into a gangrenous left third toe, underwent a angiogram of LLE with transluminal angioplasty/stent placement to the left tibioperoneal trunk on 7/25 at [facility]. Plans were to proceed with a left third toe amputation post revascularization. However due to his significant underlying medical comorbidities, this could not be done at [facility], referred to the [facility] surgery and admitted to [facility].   8/13 - left 3rd toe amputation 8/15 - left TMA and wound vac placment 8/19 - TAVR 8/22 - wound vac removed   Assessment and Plan:   Left third toe gangrene/MTA foot necrosis - S/p transmetatarsal amputation with wound VAC placement 8/15.  Wound VAC removed 8/22.  Leukocytosis persisted.  Unfortunately TMA wound continues to be dusky and necrotic appearing.  Will likely warrant further amputation.  Vascular surgery following closely.  Decision about BKA versus AKA as well as timing TBD.   Severe aortic stenosis - S/p TAVR 8/19.  Cardiology following closely.  Zio patch initially placed for ambulatory monitoring however has since been discontinued.   Chronic HFrEF - Appears euvolemic at this time.  Volume removal per HD/UF.   ESRD - HD TTS.  Nephrology following closely.  LUE AVF.   CAD with CABG - Last cath 2/25 noting severe CAD with all vein grafts occluded.  Continue aspirin , Plavix , statin.   Recent history of acute cholecystitis  requiring cholecystostomy drain-11/2023 - Percutaneous cholecystectomy drain in place.     Diabetes mellitus - A1c 5.8 suggesting excellent control.  Insulin  sliding scale more.   Normocytic anemia - Likely anemia of chronic kidney disease.  Hemoglobin stable.   Hypothyroidism - Synthroid  on board.   Goals of care - Currently being evaluated for inpatient rehab/CIR.  Working closely with TOC on disposition planning.  However extremity appears more necrotic, likely to pursue further amputation so transition to CIR on hold.   Subjective: Patient resting comfortably this morning in bedside chair.  Still having lower extremity pain.  Denies any shortness of breath, chest pain, nausea, vomiting, abdominal pain, fevers.  Has some bloody drainage from his TMA surgery site.  Physical Exam:  Vitals:   01/26/24 0002 01/26/24 0256 01/26/24 0821 01/26/24 1121  BP: 104/66 113/69 (!) 82/54 (!) 93/54  Pulse: 87 79 95 97  Resp: 16 16 16 17   Temp: 98.5 F (36.9 C) 97.7 F (36.5 C) 97.6 F (36.4 C) (!) 97.3 F (36.3 C)  TempSrc: Oral Oral Oral Oral  SpO2:   98% 98%  Weight:  76 kg    Height:        GENERAL:  Alert, pleasant, no acute distress  HEENT:  EOMI CARDIOVASCULAR:  RRR, no murmurs appreciated RESPIRATORY:  Clear to auscultation, no wheezing, rales, or rhonchi GASTROINTESTINAL:  Soft, nontender, nondistended EXTREMITIES:  No LE edema bilaterally NEURO:  No new focal deficits appreciated SKIN: LLE TMA, dusky/necrotic distally, minimal sanguinous discharge PSYCH:  Appropriate mood and affect    Data Reviewed:  Imaging Studies: ECHOCARDIOGRAM COMPLETE Result Date: 01/21/2024  ECHOCARDIOGRAM REPORT   Patient Name:   Phillip [name] Date of Exam: 01/21/2024 Medical Rec #:  [id number]        Height:       72.0 in Accession #:    [id number]       Weight:       165.6 lb Date of Birth:  [birth year]       BSA:          1.966 m Patient Age:    68 years         BP:           128/75 mmHg  Patient Gender: M                HR:           77 bpm. Exam Location:  Inpatient Procedure: 2D Echo, Cardiac Doppler and Color Doppler (Both Spectral and Color            Flow Doppler were utilized during procedure). Indications:    Post TAVR evaluation. ; I35.0 Nonrheumatic aortic (valve)                 stenosis  History:        Patient has prior history of Echocardiogram examinations, most                 recent 01/20/2024. CHF, CAD, Prior CABG, Pulmonary HTN, Aortic                 Valve Disease, Signs/Symptoms:Edema; Risk Factors:Hypertension                 and Dyslipidemia. ESRD.                 Aortic Valve: 29 mm Edwards Sapien prosthetic, stented (TAVR)                 valve is present in the aortic position. Procedure Date:                 01/20/2024.  Sonographer:    Ellouise Mose RDCS Referring Phys: [id number] [facility] R THOMPSON  Sonographer Comments: Technically difficult study due to poor echo windows. IMPRESSIONS  1. Left ventricular ejection fraction, by estimation, is 30 to 35%. The left ventricle has moderately decreased function. The left ventricle demonstrates regional wall motion abnormalities (see scoring diagram/findings for description). The left ventricular internal cavity size was moderately dilated. There is mild concentric left ventricular hypertrophy. Left ventricular diastolic parameters are consistent with Grade I diastolic dysfunction (impaired relaxation).  2. The mitral valve is degenerative. Mild to moderate mitral valve regurgitation. No evidence of mitral stenosis. Moderate mitral annular calcification.  3. The aortic valve has been repaired/replaced. There is a 29 mm Edwards Sapien prosthetic (TAVR) valve present in the aortic position. Procedure Date: 01/20/2024. Echo findings are consistent with trivial perivalvular leak of the aortic prosthesis.  4. Right ventricular systolic function is moderately reduced. The right ventricular size is normal.  5. Left atrial size was mildly  dilated. FINDINGS  Left Ventricle: Left ventricular ejection fraction, by estimation, is 30 to 35%. The left ventricle has moderately decreased function. The left ventricle demonstrates regional wall motion abnormalities. The left ventricular internal cavity size was moderately dilated. There is mild concentric left ventricular hypertrophy. Left ventricular diastolic parameters are consistent with Grade I diastolic dysfunction (impaired relaxation).  LV Wall Scoring: The inferior wall and posterior wall are akinetic. Right Ventricle: The right ventricular size is normal.  No increase in right ventricular wall thickness. Right ventricular systolic function is moderately reduced. Left Atrium: Left atrial size was mildly dilated. Right Atrium: Right atrial size was normal in size. Pericardium: There is no evidence of pericardial effusion. Mitral Valve: The mitral valve is degenerative in appearance. Moderate mitral annular calcification. Mild to moderate mitral valve regurgitation. No evidence of mitral valve stenosis. MV peak gradient, 7.8 mmHg. The mean mitral valve gradient is 3.0 mmHg. Tricuspid Valve: The tricuspid valve is normal in structure. Tricuspid valve regurgitation is trivial. No evidence of tricuspid stenosis. Aortic Valve: Trivial perivalvular leak and 2 o'clock. The aortic valve has been repaired/replaced. Aortic valve regurgitation is not visualized. No aortic stenosis is present. Aortic valve mean gradient measures 5.0 mmHg. Aortic valve peak gradient measures 10.2 mmHg. Aortic valve area, by VTI measures 4.55 cm. There is a 29 mm Edwards Sapien prosthetic, stented (TAVR) valve present in the aortic position. Procedure Date: 01/20/2024. Echo findings are consistent with normal structure and function of the aortic valve prosthesis. Pulmonic Valve: The pulmonic valve was normal in structure. Pulmonic valve regurgitation is not visualized. No evidence of pulmonic stenosis. Aorta: The aortic root and  ascending aorta are structurally normal, with no evidence of dilitation. IAS/Shunts: There is redundancy of the interatrial septum. No atrial level shunt detected by color flow Doppler.  LEFT VENTRICLE PLAX 2D LVIDd:         5.18 cm      Diastology LVIDs:         4.80 cm      LV e' medial:    3.92 cm/s LV PW:         1.28 cm      LV E/e' medial:  22.8 LV IVS:        1.39 cm      LV e' lateral:   5.55 cm/s LVOT diam:     2.90 cm      LV E/e' lateral: 16.1 LV SV:         124 LV SV Index:   63 LVOT Area:     6.61 cm  LV Volumes (MOD) LV vol d, MOD A2C: 277.0 ml LV vol d, MOD A4C: 198.0 ml LV vol s, MOD A2C: 206.0 ml LV vol s, MOD A4C: 138.0 ml LV SV MOD A2C:     71.0 ml LV SV MOD A4C:     198.0 ml LV SV MOD BP:      71.8 ml RIGHT VENTRICLE            IVC RV S prime:     7.40 cm/s  IVC diam: 1.70 cm TAPSE (M-mode): 0.7 cm LEFT ATRIUM             Index        RIGHT ATRIUM           Index LA diam:        3.96 cm 2.01 cm/m   RA Area:     16.20 cm LA Vol (A2C):   38.2 ml 19.43 ml/m  RA Volume:   46.30 ml  23.55 ml/m LA Vol (A4C):   28.2 ml 14.34 ml/m LA Biplane Vol: 32.4 ml 16.48 ml/m  AORTIC VALVE AV Area (Vmax):    5.08 cm AV Area (Vmean):   4.73 cm AV Area (VTI):     4.55 cm AV Vmax:           160.00 cm/s AV Vmean:  100.467 cm/s AV VTI:            0.273 m AV Peak Grad:      10.2 mmHg AV Mean Grad:      5.0 mmHg LVOT Vmax:         123.00 cm/s LVOT Vmean:        71.900 cm/s LVOT VTI:          0.188 m LVOT/AV VTI ratio: 0.69  AORTA Ao Root diam: 3.51 cm Ao Asc diam:  3.59 cm MITRAL VALVE MV Area (PHT): 4.15 cm     SHUNTS MV Area VTI:   3.42 cm     Systemic VTI:  0.19 m MV Peak grad:  7.8 mmHg     Systemic Diam: 2.90 cm MV Mean grad:  3.0 mmHg MV Vmax:       1.40 m/s MV Vmean:      88.5 cm/s MV Decel Time: 183 msec MV E velocity: 89.40 cm/s MV A velocity: 127.00 cm/s MV E/A ratio:  0.70 Toribio Fuel MD Electronically signed by Toribio Fuel MD Signature Date/Time: 01/21/2024/12:14:45 PM    Final     ECHOCARDIOGRAM LIMITED Result Date: 01/20/2024    ECHOCARDIOGRAM LIMITED REPORT   Patient Name:   Phillip [name] Date of Exam: 01/20/2024 Medical Rec #:  [id number]        Height:       72.0 in Accession #:    [id number]       Weight:       165.8 lb Date of Birth:  1955/07/26       BSA:          1.967 m Patient Age:    68 years         BP:           105/60 mmHg Patient Gender: M                HR:           61 bpm. Exam Location:  Inpatient Procedure: Limited Color Doppler (Both Spectral and Color Flow Doppler were            utilized during procedure). Indications:     aortic stenosis. TAVR  History:         Patient has prior history of Echocardiogram examinations, most                  recent 08/01/2023. CAD, Prior CABG, end stage renal disease;                  Risk Factors:Hypertension, Dyslipidemia, Diabetes and Sleep                  Apnea.                  Aortic Valve: 29 mm valve is present in the aortic position.                  Procedure Date: 01/20/2024.  Sonographer:     Tinnie Barefoot RDCS Referring Phys:  [id number] [facility] THOMPSON Diagnosing Phys: Darryle Decent MD IMPRESSIONS  1. Echo guided TAVR. Vmax 1.21 m/s, MG 3.0 mmHG, EOA 4.57 cm2. No regurgitation or paravalvular leak. Aortic valve regurgitation is not visualized. There is a 29 mm valve present in the aortic position. Procedure Date: 01/20/2024. Aortic valve area, by VTI measures 4.57 cm. Aortic valve mean gradient measures 3.0 mmHg. Aortic valve Vmax measures 1.21 m/s.  2.  Left ventricular ejection fraction, by estimation, is 30 to 35%. The left ventricle has moderately decreased function.  3. Right ventricular systolic function is normal. The right ventricular size is normal.  4. The mitral valve is degenerative. Mild mitral valve regurgitation. No evidence of mitral stenosis. FINDINGS  Left Ventricle: Left ventricular ejection fraction, by estimation, is 30 to 35%. The left ventricle has moderately decreased function.  LV Wall  Scoring: The inferior wall and posterior wall are akinetic. Right Ventricle: The right ventricular size is normal. No increase in right ventricular wall thickness. Right ventricular systolic function is normal. Pericardium: There is no evidence of pericardial effusion. Mitral Valve: The mitral valve is degenerative in appearance. Mild mitral valve regurgitation. No evidence of mitral valve stenosis. Tricuspid Valve: The tricuspid valve is grossly normal. Tricuspid valve regurgitation is mild . No evidence of tricuspid stenosis. Aortic Valve: Echo guided TAVR. Vmax 1.21 m/s, MG 3.0 mmHG, EOA 4.57 cm2. No regurgitation or paravalvular leak. Aortic valve regurgitation is not visualized. Aortic regurgitation PHT measures 446 msec. Aortic valve mean gradient measures 3.0 mmHg. Aortic valve peak gradient measures 5.9 mmHg. Aortic valve area, by VTI measures 4.57 cm. There is a 29 mm valve present in the aortic position. Procedure Date: 01/20/2024. Aorta: The aortic root and ascending aorta are structurally normal, with no evidence of dilitation. Additional Comments: Spectral Doppler performed. Color Doppler performed.  LEFT VENTRICLE PLAX 2D LVIDd:         5.30 cm LV PW:         1.00 cm LV IVS:        1.20 cm LVOT diam:     2.68 cm LV SV:         105 LV SV Index:   54 LVOT Area:     5.64 cm  LV Volumes (MOD) LV vol d, MOD A4C: 181.0 ml LV vol s, MOD A4C: 117.0 ml LV SV MOD A4C:     181.0 ml LEFT ATRIUM         Index LA diam:    3.70 cm 1.88 cm/m  AORTIC VALVE AV Area (Vmax):    4.90 cm AV Area (Vmean):   4.80 cm AV Area (VTI):     4.57 cm AV Vmax:           121.00 cm/s AV Vmean:          80.400 cm/s AV VTI:            0.231 m AV Peak Grad:      5.9 mmHg AV Mean Grad:      3.0 mmHg LVOT Vmax:         105.00 cm/s LVOT Vmean:        68.400 cm/s LVOT VTI:          0.187 m LVOT/AV VTI ratio: 0.81 AI PHT:            446 msec  AORTA Ao Root diam: 3.40 cm Ao Asc diam:  3.30 cm  SHUNTS Systemic VTI:  0.19 m Systemic Diam:  2.68 cm Darryle Decent MD Electronically signed by Darryle Decent MD Signature Date/Time: 01/20/2024/6:03:09 PM    Final    Structural Heart Procedure Result Date: 01/20/2024 See surgical note for result.  IR Radiologist Eval & Mgmt Result Date: 01/01/2024 : Chief Complaint:Patient was seen in consultation today for acute cholecystitis s/p percutaneous cholecystostomy drain placement,Referring Physician(s):Dr. Dreama Hanger, MD Supervising Physician: Johann, DanielHistory of Present Illness:Gloyd D Stitely is a 68 y.o.  male with past medical history of ESRD on HD, severe aortic stenosis planning on TAVR soon, HFrEF, NSTEMI 2022, CAD s/p CABG in 2003, GERD, DM2, HTN, thrombocytopenia, and prior lap appendectomy in 2020 who presented to [facility] ED with acute abdominal pain after he was found to be febrile at his dialysis center 6/10. Initially his RUQ US  was nonspecific for acute cholecystitis despite RUQ pain, tachycardia, fever, and elevated lactic acid. However, subsequent MR Abdomen/MRCP showed a distended gallbladder with innumerable stones, without biliary duct dilation. Due to notable cardiac and ESRD comorbidities, patient was recommended for percutaneous cholecystostomy drain placement, which was placed in IR on 6/11. He improved subsequently, and eventually discharged with multiple follow-up appointments, including with outpatient IR clinic. His planned TAVR pprocedure was postponed in light of his acute cholecystitis.Patient has been seen by cardiology and vascular surgery. His TAVR remains pending. He was informed by his cardiology team that his potential cholecystectomy will have to be deferred pending cardiac evaluation, TAVR procedure, and eventual clearance.Patient presents to IR clinic today for 7 week follow-up. He endorses expected discomfort from his drain, but other is overall well. He is tired since his dialysis session yesterday. He has a necrotic toe, being evaluated, that causes him pain. His  wife is at his side. His drain has good bilious output daily. His wife flushes it daily. He denies fevers, chills, abdominal pain, nausea, vomiting. His surgical follow-up appointment is scheduled for 8/11 with Dr. Dasie. Past Medical History: Diagnosis * : Date . * : Abdominopelvic abscess (HCC) * : 09/29/2018 . * : Abscess of appendix * : 09/22/2018 . * : Acquired spondylolisthesis of lumbosacral region * : 03/20/2020 . * : Acute cholecystitis * : 11/15/2023 . * : Acute kidney injury (HCC) * : 10/07/2018 . * : Acute kidney injury superimposed on chronic kidney disease (HCC) * : 11/06/2020 . * : Acute kidney injury superimposed on CKD (HCC) * : 02/18/2020 . * : Acute on chronic systolic (congestive) heart failure (HCC) * : 07/26/2015 * : Formatting of this note might be different from the original. Formatting of this note might be different from the original. Formatting of this note might be different from the original. revenkar EF 35% Formatting of this note might be different from the original. revenkar EF 35% . * : Acute on chronic systolic CHF (congestive heart failure) (HCC) * : 08/12/2023 . * : Anasarca * : 07/01/2023 . * : Anemia of chronic disease * : 08/18/2019 . * : Anemia, chronic disease * : 08/18/2019 . * : Aortic atherosclerosis (HCC) * : . * : Aortic stenosis * : * : a.) TTE 09/15/2018: mild-mod (MPG 19); b.) TTE 03/21/2021: mild-mod (MPG 14.3); c.) TTE 04/24/2022: mild- mod (MPG 15) . * : Aortic stenosis, moderate * : 10/15/2019 . * : Appendicitis * : 09/08/2018 . * : ARF (acute renal failure) (HCC) * : 02/19/2020 . * : Asthma * : . * : Atherosclerosis of native arteries of the extremities with ulceration (HCC) * : 02/24/2018 * : Formatting of this note might be different from the original. Last Assessment & Plan: His ABIs today are 1.07 on the right and 0.99 on the left with biphasic waveforms. Although these pressures may be somewhat elevated from medial calcification, his flow does appear to be  reasonably good. His left ABI was 0.58 prior to intervention. At this point, we will stretch out his follow-up and see hi . * : Benign hypertension with CKD (chronic  kidney disease) stage IV (HCC) * : 02/24/2018 . * : Benign prostatic hyperplasia without lower urinary tract symptoms * : 01/14/2018 . * : Bilateral lower extremity edema * : 11/04/2018 . * : Bradycardia * : 03/25/2018 . * : Bruit of right carotid artery * : 07/26/2015 . * : CAD (coronary artery disease) * : 2003 * : a.) s/p 4v CABG 2003 . * : Cardiac murmur * : 07/26/2015 . * : Choledocholithiasis * : 11/12/2023 . * : Chronic combined systolic and diastolic CHF (congestive heart failure) (HCC) * : 07/26/2015 * : a.) TTE 09/15/2018: EF 50-55%, mod LVH, RVE, BAE, mild-mod TR, AoV sclerosis, G1DD; b.) MPI 10/27/2019: EF <30%; c.) TTE 03/21/2021: EF 35-40%, post AK, inf HK, mod LVH, mod red RVSF, mod LAE, mod Aov sclerosis, G2DD; c.) TTE 04/24/2022: EF 35-40%, post AK, glok HK, mod LVE, mod red RVSF, mild-mod MR, Aov sclerosis, G3DD . * : Chronic idiopathic constipation * : 11/02/2018 . * : Chronic low back pain without sciatica * : 12/02/2018 . * : Chronic pain of right hip * : 03/31/2020 . * : Chronic systolic heart failure (HCC) * : 07/26/2015 * : Formatting of this note might be different from the original. revenkar EF 35% . * : CKD (chronic kidney disease) stage 4, GFR 15-29 ml/min (HCC) * : 10/07/2018 . * : Congestive heart failure (HCC) * : 07/26/2015 * : Last Assessment & Plan: Poor cardiac function could certainly be contributing to poor blood supply to the feet and toes as well. Formatting of this note might be different from the original. Formatting of this note might be different from the original. Last Assessment & Plan: Poor cardiac function could certainly be contributing to poor blood supply to the feet and toes as well. Last . * : Coronary artery disease * : . * : Diabetes mellitus without complication (HCC) * : . * : Dysphagia * :  07/26/2015 . * : Encounter for long-term (current) use of insulin  (HCC) * : 09/27/2020 . * : Erectile dysfunction * : 07/14/2017 . * : ESRD on dialysis (HCC) * : 11/15/2023 . * : Essential hypertension * : 02/24/2018 . * : Fever * : 11/07/2020 . * : Foot ulcer (HCC) * : 09/27/2020 * : left foot Formatting of this note might be different from the original. Formatting of this note might be different from the original. left foot . * : Foot ulcer, left (HCC) * : . * : GERD (gastroesophageal reflux disease) * : . * : Heart murmur * : 07/26/2015 . * : Hematuria * : 07/27/2015 . * : High risk medication use * : 07/26/2015 . * : History of marijuana use * : . * : Hyperkalemia * : 05/29/2020 . * : Hyperlipidemia * : 07/26/2015 . * : Hyperlipidemia associated with type 2 diabetes mellitus (HCC) * : 02/18/2020 . * : Hypertension * : . * : Hypertension associated with diabetes (HCC) * : . * : Hypoglycemia * : 05/07/2019 . * : Hypothyroidism * : 11/14/2015 . * : Hypothyroidism (acquired) * : 11/14/2015 . * : Insomnia * : 11/02/2018 . * : Kidney insufficiency * : . * : Lumbar spondylosis * : 03/20/2020 . * : Malaise and fatigue * : 03/25/2018 . * : Malnutrition of moderate degree * : 08/20/2023 . * : Mitral regurgitation * : 05/12/2019 . * : Moderate aortic regurgitation * : 05/12/2019 . * : Myocardial infarction due to  demand ischemia [facility]) * : 09/26/2020 * : a.) Type II NSTEMI; b.) troponins were trended 0.54 --> 0.56 --> 0.52 ng/mL . * : Non-compliance * : 05/05/2019 . * : NSTEMI (non-ST elevated myocardial infarction) (HCC) * : 09/27/2020 . * : Osseous and subluxation stenosis of intervertebral foramina of lumbar region * : 03/20/2020 . * : Osteoarthritis * : 10/06/2019 . * : Pancytopenia (HCC) * : 08/12/2023 . * : Peripheral vascular disease (HCC) * : . * : Personal history of tobacco use, presenting hazards to health * : 09/27/2020 . * : Polyneuropathy in diabetes (HCC) * : 05/07/2019 . * : Primary osteoarthritis involving  multiple joints * : 10/06/2019 . * : Primary osteoarthritis of right hip * : 02/21/2020 . * : Pulmonary HTN (HCC) * : * : a.) TTE 05/12/2019: PASP 71; b.) TTE 09/27/2020: PASP >70; c.) TTE 03/21/2021: RVSP 43; d.) TTE 04/24/2022: RVSP 80.6 . * : Puncture wound of right hip * : 04/02/2018 . * : PVD (peripheral vascular disease) with claudication (HCC) * : * : a.) s/p PTA 03/02/2018 - balloon angioplasty LEFT below knee popliteal artery; b.) s/p PTA 09/30/2022: baloon angioplasty LEFT tibioperoneal trunck, most proximal peroneal artery, and LEFT popliteal artery. . * : Regional wall motion abnormality of heart * : 05/07/2019 . * : Right groin pain * : 02/18/2020 . * : S/P CABG x 4 * : 2003 . * : Screening for colon cancer * : 02/18/2018 . * : Sepsis (HCC) * : 11/15/2023 . * : Sleep apnea * : 07/26/2015 * : a.) does not require nocturnal PAP therapy . * : Subacute osteomyelitis of left foot (HCC) * : 03/25/2018 . * : Thrombocytopenia (HCC) * : . * : Type 2 diabetes mellitus with hyperlipidemia (HCC) * : 07/26/2015 . * : Type 2 diabetes mellitus with stage 4 chronic kidney disease, with long-term current use of insulin  (HCC) * : 07/26/2015 . * : Type II diabetes mellitus with neurological manifestations (HCC) * : 02/18/2020 . * : Vitamin B12 deficiency * : 06/16/2019 . * : Vitamin D deficiency * : 05/07/2018 Past Surgical History: Procedure * : Laterality * : Date . * : AMPUTATION TOE * : Left * : 03/13/2018 * : Procedure: AMPUTATION TOE-MPJ; Surgeon: Ashley Soulier, DPM; Location: [facility] ORS; Service: Podiatry; Laterality: Left; . * : ANGIOPLASTY * : * : . * : AV FISTULA PLACEMENT * : Left * : 08/20/2023 * : Procedure: ARTERIOVENOUS (AV) FISTULA CREATION; Surgeon: Gretta Lonni PARAS, MD; Location: [facility] OR; Service: Vascular; Laterality: Left; . * : CARDIAC CATHETERIZATION * : * : . * : CORONARY ARTERY BYPASS GRAFT * : N/A * : 2003 . * : IR FLUORO GUIDE CV LINE RIGHT * : * : 08/18/2023 . * : IR PERC CHOLECYSTOSTOMY * : *  : 11/12/2023 . * : IR US  GUIDE VASC ACCESS RIGHT * : * : 08/18/2023 . * : LAPAROSCOPIC APPENDECTOMY * : N/A * : 09/08/2018 * : Procedure: APPENDECTOMY LAPAROSCOPIC; Surgeon: Jordis Laneta FALCON, MD; Location: [facility] ORS; Service: General; Laterality: N/A; . * : LOWER EXTREMITY ANGIOGRAPHY * : Left * : 03/02/2018 * : Procedure: LOWER EXTREMITY ANGIOGRAPHY; Surgeon: Marea Selinda RAMAN, MD; Location: [facility] INVASIVE CV [facility]; Service: Cardiovascular; Laterality: Left; . * : LOWER EXTREMITY ANGIOGRAPHY * : Left * : 09/30/2022 * : Procedure: Lower Extremity Angiography; Surgeon: Marea Selinda RAMAN, MD; Location: [facility]; Service: Cardiovascular; Laterality: Left; . * : LOWER  EXTREMITY ANGIOGRAPHY * : Left * : 12/26/2023 * : Procedure: Lower Extremity Angiography; Surgeon: Marea Selinda RAMAN, MD; Location: [facility]; Service: Cardiovascular; Laterality: Left; . * : RIGHT/LEFT HEART CATH AND CORONARY/GRAFT ANGIOGRAPHY * : N/A * : 08/01/2023 * : Procedure: RIGHT/LEFT HEART CATH AND CORONARY/GRAFT ANGIOGRAPHY; Surgeon: Wendel Lurena POUR, MD; Location: [facility]; Service: Cardiovascular; Laterality: N/A; . * : ROTATOR CUFF REPAIR * : Left * : Allergies:Patient has no known allergies.Medications: Prior to Admission medications Medication * : Sig * : Start Date * : End Date * : Taking? * : Authorizing Provider acetaminophen  (TYLENOL ) 500 MG tablet * : Take 1,000 mg by mouth every 6 (six) hours as needed for moderate pain or headache. * : * : * : * : [provider] aspirin  EC 81 MG tablet * : Take 1 tablet (81 mg total) by mouth daily. Swallow whole. * : 07/25/23 * : * : * : Thukkani, Arun K, MD atorvastatin  (LIPITOR) 20 MG tablet * : Take 1 tablet (20 mg total) by mouth daily. * : 07/25/23 * : * : * : Thukkani, Arun K, MD carvedilol  (COREG ) 3.125 MG tablet * : Take 1 tablet (3.125 mg total) by mouth 2 (two) times daily. * : 05/20/23 * : 05/02/24 * : * : Carlin Delon BROCKS, NP clopidogrel  (PLAVIX ) 75 MG tablet * : Take 1  tablet (75 mg total) by mouth daily. * : 12/26/23 * : * : * : Dew, Selinda RAMAN, MD gabapentin  (NEURONTIN ) 300 MG capsule * : Take 1 capsule (300 mg total) by mouth at bedtime.Patient taking differently: Take 300 mg by mouth 2 (two) times daily. * : 08/25/23 * : * : * : Arrien, Elidia Sieving, MD insulin  glargine (LANTUS ) 100 UNIT/ML injection * : Inject 7 Units into the skin daily as needed (Diabetes). Inject 7 units at bedtime as needed after checking glucose * : * : * : * : [provider] isosorbide  mononitrate (IMDUR ) 120 MG 24 hr tablet * : Take 1 tablet (120 mg total) by mouth daily. * : 08/27/23 * : * : * : Revankar, Jennifer SAUNDERS, MD latanoprost  (XALATAN ) 0.005 % ophthalmic solution * : Place 1 drop into both eyes at bedtime. * : 04/15/23 * : * : * : [provider] levothyroxine  (SYNTHROID ) 50 MCG tablet * : Take 50 mcg by mouth daily. * : 04/05/23 * : * : * : [provider] nitroGLYCERIN  (NITROSTAT ) 0.4 MG SL tablet * : Place 1 tablet (0.4 mg total) under the tongue every 5 (five) minutes as needed for chest pain. * : 11/20/20 * : * : * : Revankar, Jennifer SAUNDERS, MD sodium chloride  flush (NS) 0.9 % SOLN * : Inject 5 mLs into the vein daily for 15 days. flush drain daily with 5 cc NS, record output daily, dressing changes every 2-3 days or earlier if soiled * : 11/14/23 * : 12/15/23 * : * : Darci Pore, MD tamsulosin  (FLOMAX ) 0.4 MG CAPS capsule * : Take 0.4 mg by mouth daily after breakfast. * : 04/19/20 * : * : * : [provider] vitamin B-12 (CYANOCOBALAMIN ) 500 MCG tablet * : Take 500 mcg by mouth daily. * : * : * : * : [provider] Vitamin D, Ergocalciferol, (DRISDOL) 1.25 MG (50000 UNIT) CAPS capsule * : Take 50,000 Units by mouth once a week. Mondays * : 07/13/21 * : * : * :  [provider] Family History Problem * : Relation * : Age of Onset . * : Hyperlipidemia * : Mother * : . * : Hypertension * : Mother * : . * : Diabetes * : Mother * : . * : Heart  attack * : Brother * : . * : Heart disease * : Brother * : . * : Lung disease * : Father * : Socioeconomic History . * : Marital status: * : Married * : * : Spouse name: * : Not on file . * : Number of children: * : Not on file . * : Years of education: * : Not on file . * : Highest education level: * : Not on file Occupational History . * : Not on file Tobacco Use . * : Smoking status: * : Former * : * : Current packs/day: * : 0.00 * : * : Average packs/day: * : 0.5 packs/day for 25.0 years (12.5 ttl pk-yrs) * : * : Types: * : Cigarettes * : * : Start date: * : 11 * : * : Quit date: * : 2003 * : * : Years since quitting: * : 22.5 . * : Smokeless tobacco: * : Never Vaping Use . * : Vaping status: * : Never Used Substance and Sexual Activity . * : Alcohol use: * : Not Currently * : * : Comment: 2003 . * : Drug use: * : Never . * : Sexual activity: * : Not on file Other Topics * : Concern . * : Not on file Social History Narrative . * : Not on file Financial Resource Strain: Low Risk  (09/26/2020) * : Received from [facility] * : Overall Financial Resource Strain (CARDIA) * : . * : Difficulty of Paying Living Expenses: Not hard at all Food Insecurity: No Food Insecurity (11/12/2023) * : Hunger Vital Sign * : . * : Worried About Running Out of Food in the Last Year: Never true * : . * : Ran Out of Food in the Last Year: Never true Transportation Needs: No Transportation Needs (11/12/2023) * : PRAPARE - Transportation * : . * : Lack of Transportation (Medical): No * : . * : Lack of Transportation (Non-Medical): No Physical Activity: Inactive (09/26/2020) * : Received from [facility] * : Exercise Vital Sign * : . * : On average, how many days per week do you engage in moderate to strenuous exercise (like a brisk walk)?: 0 days * : . * : On average, how many minutes do you engage in exercise at this level?: 0 min Stress: No Stress Concern Present (09/26/2020) * : Received from [facility] * : [facility]  of Occupational Health - Occupational Stress Questionnaire * : . * : Feeling of Stress : Not at all Social Connections: Moderately Integrated (11/12/2023) * : Social Connection and Isolation Panel * : . * : Frequency of Communication with Friends and Family: Three times a week * : . * : Frequency of Social Gatherings with Friends and Family: Once a week * : . * : Attends Religious Services: 1 to 4 times per year * : . * : Active Member of Clubs or Organizations: No * : . * : Attends Club or Organization Meetings: Never * : . * : Marital Status: Married Review of Systems: A 12 point ROS discussed and pertinent positives are indicated in the HPI above. All other systems are negative.Physical  ExamConstitutional: Appearance: Normal appearance. Abdominal: General: Abdomen is flat. Palpations: Abdomen is soft. Comments: RUQ drain appropriately dressed. Dressing is clean, dry, intact.Drain incision site non-tender, without evidence of infection. Retaining suture and Stat Lock in place. Good bilious output in collection bag. Neurological: Mental Status: He is alert. Imaging: PERIPHERAL VASCULAR CATHETERIZATIONResult Date: 7/25/2025See surgical note for result. VAS US  ABI WITH/WO TBIResult Date: 12/23/2023 LOWER EXTREMITY DOPPLER STUDY Patient Name: KARTHIK WHITTINGHILL Date of Exam: 12/22/2023 Medical Rec #: [id number] Accession #: [id number] Date of Birth: 04-26-1956 Patient Gender: M Patient Age: 37 years Exam Location: [facility] Procedure: VAS US  ABI WITH/WO TBI Referring Phys: -------------------------------------------------------------------------------- Indications: Peripheral artery disease. Vascular Interventions: 09/30/2022 Percutaneous transluminal angioplasty of left tibioperoneal trunk and most proximal peroneal artery with 4 mm diameter by 6 cm length Lutonix drug-coated angioplasty balloon 5. Percutaneous transluminal angioplasty of left popliteal artery with 6 mm diameter by 8 cm length Lutonix  drug-coated angioplasty balloon. Comparison Study: 09/2022 Performing Technologist: Jerel Croak RVT Examination Guidelines: A complete evaluation includes at minimum, Doppler waveform signals and systolic blood pressure reading at the level of bilateral brachial, anterior tibial, and posterior tibial arteries, when vessel segments are accessible. Bilateral testing is considered an integral part of a complete examination. Photoelectric Plethysmograph (PPG) waveforms and toe systolic pressure readings are included as required and additional duplex testing as needed. Limited examinations for reoccurring indications may be performed as noted. ABI Findings: +---------+------------------+-----+----------+--------+ Right Rt Pressure (mmHg)IndexWaveform Comment  +---------+------------------+-----+----------+--------+ Brachial 118     +---------+------------------+-----+----------+--------+ ATA   monophasicNonComp  +---------+------------------+-----+----------+--------+ PTA   monophasicNonComp  +---------+------------------+-----+----------+--------+ Great Toe50 0.42 Abnormal   +---------+------------------+-----+----------+--------+ +---------+------------------+-----+--------+-------+ Left Lt Pressure (mmHg)IndexWaveformComment +---------+------------------+-----+--------+-------+ Brachial 120     +---------+------------------+-----+--------+-------+ ATA   biphasicNonComp +---------+------------------+-----+--------+-------+ PTA   biphasicNonComp +---------+------------------+-----+--------+-------+ Great Toe82 0.68 Abnormal  +---------+------------------+-----+--------+-------+ +-------+-----------+-------------+------------+------------+ ABI/TBIToday's ABIToday's TBI Previous ABIPrevious TBI +-------+-----------+-------------+------------+------------+ Right NonComp .42 NonComp .50  +-------+-----------+-------------+------------+------------+  Left NonComp .68 (2nd toe)NonComp Amp  +-------+-----------+-------------+------------+------------+ Bilateral ABIs appear essentially unchanged compared to prior study on 09/2022. Summary: Right: Resting right ankle-brachial index indicates noncompressible right lower extremity arteries. The right toe-brachial index is abnormal. Left: Resting left ankle-brachial index indicates noncompressible left lower extremity arteries. The left toe-brachial index is abnormal. Great toe amp; 2nd toe near normal TBI. *See table(s) above for measurements and observations. Electronically signed by Cordella Shawl MD on 12/23/2023 at 7:24:13 AM. Final Labs:CBC: Recent Labs 06/10/25193006/11/25063106/12/25065206/13/250552 WBC12.1*8.16.36.8HGB12.8*10.8*11.6*11.4*HCT38.8*32.9*36.8*35.1*PLT81*76*85*95* COAGS: Recent Labs 03/16/25022806/10/25193006/11/251011 INR1.4*1.4*1.3* BMP: Recent Labs 06/10/25193006/11/25063106/12/25065206/13/250552 WJ865*865*864864 K3.83.74.74.1CL94*96*94*95*[id number] GLUCOSE148*85175*112*BUN31*35*61*35*CALCIUM8.1*7.8*8.0*8.4*CREATININE4.93*5.52*7.54*5.48*GFRNONAA12*11*7*11* LIVER FUNCTION TESTS: Recent Labs 03/24/25031606/10/25193006/11/25063106/12/250821 BILITOT -- 4.1*4.8*2.4*AST -- 54*49*187*ALT -- 51*4297*ALKPHOS -- 841*865*876EMNU -- 8.07.08.1ALBUMIN3.63.2*2.7*2.7* TUMOR MARKERS: No results for input(s): AFPTM, CEA, CA199, CHROMGRNA in the last 8760 hours. Assessment and Plan:Acute cholecystitis s/p percutaneous cholecystostomy drain placement on 6/11. *Patient is well overall, with expected discomfort from drain. *Good daily bilious output into drainage bag. *Patient may flush drain every 3 days, or sooner with concerns of decreased output or clogged drain. *Patient is currently undergoing extensive cardiac evaluation and is pending TAVR procedure. He will have to obtain clearance by his cardiac team prior to undergoing potential cholecystectomy. *Keep follow-up appointment with Surgery service on  8/11. *IR will see patient in clinic every 6-8 weeks for regular drain exchanges until he is cleared for cholecystectomy, or alternate plan by care team. *Drain was exchanged today by Dr. Johann. Return in 6-8 weeks, on a recurring basis, for drain exchanges. *Return to clinic precautions given to patient and his wife. *Both patient and his wife voiced understanding and are amenable to this plan. Electronically Signed:Charles A Carim  PA-C7/30/2025, 5:56 PM Electronically Signed   By: JONETTA Faes M.D.   On: 01/01/2024 11:10   IR Catheter Tube Change Result Date: 01/01/2024 INDICATION: Acute calculus cholecystitis, status post cholecystostomy catheter placement 11/12/2023, presents for scheduled exchange. No complicating features. EXAM: PERCUTANEOUS CHOLECYSTOSTOMY CATHETER EXCHANGE UNDER FLUOROSCOPY MEDICATIONS: No periprocedural antibiotics were indicated ANESTHESIA/SEDATION: Lidocaine  1% subcutaneous FLUOROSCOPY: Radiation Exposure Index (as provided by the fluoroscopic device): 22 mGy Kerma COMPLICATIONS: None immediate. PROCEDURE: Informed written consent was obtained from the patient after a thorough discussion of the procedural risks, benefits and alternatives. All questions were addressed. Maximal Sterile Barrier Technique was utilized including caps, mask, sterile gowns, sterile gloves, sterile drape, hand hygiene and skin antiseptic. A timeout was performed prior to the initiation of the procedure. Small contrast injection through the existing catheter confirmed placement within the gallbladder lumen. Catheter cut and exchanged over an angled Glidewire. A new 10 French per pigtail catheter was advanced and positioned centrally in the gallbladder. Contrast injection confirms good positioning. Catheter secured externally 0 Prolene suture and placed to external drain bag. The patient tolerated the procedure well. IMPRESSION: 1. Technically successful exchange of cholecystostomy catheter under fluoroscopy.  Electronically Signed   By: JONETTA Faes M.D.   On: 01/01/2024 11:08    There are no new results to review at this time.  Previous records (including but not limited to H&P, progress notes, nursing notes, TOC management) were reviewed in assessment of this patient.  Labs: CBC: Recent Labs  Lab 01/22/24 0347 01/23/24 0418 01/24/24 0353 01/25/24 0329 01/26/24 0328  WBC 12.6* 13.3* 12.2* 13.8* 11.8*  HGB 9.8* 10.6* 10.8* 10.1* 9.8*  HCT 30.1* 32.9* 33.7* 31.8* 30.1*  MCV 94.1 93.5 93.9 94.6 94.1  PLT 169 178 178 188 170   Basic Metabolic Panel: Recent Labs  Lab 01/21/24 0331 01/22/24 0347 01/23/24 0418 01/24/24 0353 01/25/24 0329 01/26/24 0328  NA 131* 132* 133* 134* 135 134*  K 4.8 5.3* 4.8 4.7 4.6 4.4  CL 92* 90* 92* 92* 94* 94*  CO2 22 21* 23 22 22 25   GLUCOSE 109* 120* 129* 122* 97 145*  BUN 72* 96* 61* 78* 46* 64*  CREATININE 10.74* 12.08* 8.48* 10.31* 7.03* 9.00*  CALCIUM  8.5* 8.2* 9.1 8.7* 8.8* 8.9  MG 2.2  --   --   --   --  2.3   Liver Function Tests: Recent Labs  Lab 01/19/24 1844 01/26/24 0328  AST 22 17  ALT <5 <5  ALKPHOS 161* 96  BILITOT 0.8 0.8  PROT 9.4* 8.4*  ALBUMIN  2.5* 2.4*   CBG: Recent Labs  Lab 01/25/24 1204 01/25/24 1623 01/25/24 2116 01/26/24 0603 01/26/24 1158  GLUCAP 127* 139* 149* 127* 190*    Scheduled Meds:  (feeding supplement) PROSource Plus  30 mL Oral BID BM   aspirin  EC  81 mg Oral Daily   atorvastatin   20 mg Oral Daily   carvedilol   3.125 mg Oral BID   Chlorhexidine  Gluconate Cloth  6 each Topical Q0600   Chlorhexidine  Gluconate Cloth  6 each Topical Q0600   clopidogrel   75 mg Oral Daily   heparin   5,000 Units Subcutaneous Q8H   isosorbide  mononitrate  120 mg Oral Daily   levothyroxine   50 mcg Oral Daily   sevelamer  carbonate  800 mg Oral TID WC   sodium chloride  flush  3 mL Intravenous Q12H   tamsulosin   0.4 mg Oral QPC breakfast   Continuous Infusions:  sodium chloride      norepinephrine  (LEVOPHED )  Adult  infusion     PRN Meds:.sodium chloride , acetaminophen  **OR** acetaminophen , albuterol , bisacodyl , bisacodyl , HYDROmorphone  (DILAUDID ) injection, menthol -cetylpyridinium, nitroGLYCERIN , oxyCODONE , polyethylene glycol, sodium chloride  flush  Family Communication: None at bedside  Disposition: Status is: Inpatient Remains inpatient appropriate because: impending amputation     Time spent: 36 minutes  Length of inpatient stay: 12 days  Author: Carliss LELON Canales, DO 01/26/2024 12:43 PM  For on call review [email or web address].

## 2024-01-26 NOTE — Plan of Care (Signed)

## 2024-01-26 NOTE — Plan of Care (Signed)
  Problem: Clinical Measurements: Goal: Will remain free from infection Outcome: Progressing   Problem: Coping: Goal: Level of anxiety will decrease Outcome: Progressing   Problem: Nutrition: Goal: Adequate nutrition will be maintained Outcome: Progressing   Problem: Safety: Goal: Ability to remain free from injury will improve Outcome: Progressing   Problem: Skin Integrity: Goal: Risk for impaired skin integrity will decrease Outcome: Progressing   Problem: Pain Managment: Goal: General experience of comfort will improve and/or be controlled Outcome: Progressing

## 2024-01-26 NOTE — Progress Notes (Signed)
 Patient's BP is on soft side, denies any symptoms, has poor oral intake, encouraged for oral intake, food and drinks provided. Notified to the MD and antihypertensives not given, will continue to monitor.    01/26/24 0821  Vitals  Temp 97.6 F (36.4 C)  Temp Source Oral  BP (!) 82/54  MAP (mmHg) (!) 63  BP Location Right Arm  BP Method Automatic  Patient Position (if appropriate) Lying  Pulse Rate 95  Pulse Rate Source Monitor  ECG Heart Rate 96  Resp 16  Level of Consciousness  Level of Consciousness Alert  MEWS COLOR  MEWS Score Color Green  Oxygen Therapy  SpO2 98 %  O2 Device Room Air  Pain Assessment  Pain Scale 0-10  Pain Score 0  MEWS Score  MEWS Temp 0  MEWS Systolic 1  MEWS Pulse 0  MEWS RR 0  MEWS LOC 0  MEWS Score 1

## 2024-01-26 NOTE — Progress Notes (Signed)
  Progress Note    01/26/2024 7:37 AM 6 Days Post-Op  Subjective:  still having a lot of left foot pain   Vitals:   01/26/24 0002 01/26/24 0256  BP: 104/66 113/69  Pulse: 87 79  Resp: 16 16  Temp: 98.5 F (36.9 C) 97.7 F (36.5 C)  SpO2:     Physical Exam: Cardiac:  regular Lungs:  non labored Incisions:  Left TMA with large eschar as shown below. Bloody drainage from central aspect. Dry dressings and ACE reapplied   Neurologic: alert and oriented  CBC    Component Value Date/Time   WBC 11.8 (H) 01/26/2024 0328   RBC 3.20 (L) 01/26/2024 0328   HGB 9.8 (L) 01/26/2024 0328   HGB 11.5 (L) 05/16/2023 1429   HCT 30.1 (L) 01/26/2024 0328   HCT 35.3 (L) 05/16/2023 1429   PLT 170 01/26/2024 0328   PLT 144 (L) 05/16/2023 1429   MCV 94.1 01/26/2024 0328   MCV 90 05/16/2023 1429   MCH 30.6 01/26/2024 0328   MCHC 32.6 01/26/2024 0328   RDW 15.1 01/26/2024 0328   RDW 13.5 05/16/2023 1429   LYMPHSABS 0.7 11/11/2023 1930   LYMPHSABS 0.7 05/16/2023 1429   MONOABS 0.8 11/11/2023 1930   EOSABS 0.0 11/11/2023 1930   EOSABS 0.1 05/16/2023 1429   BASOSABS 0.0 11/11/2023 1930   BASOSABS 0.0 05/16/2023 1429    BMET    Component Value Date/Time   NA 134 (L) 01/26/2024 0328   NA 145 (H) 07/14/2023 1412   NA 136 08/30/2013 1025   K 4.4 01/26/2024 0328   K 3.7 08/30/2013 1025   CL 94 (L) 01/26/2024 0328   CL 101 08/30/2013 1025   CO2 25 01/26/2024 0328   CO2 31 08/30/2013 1025   GLUCOSE 145 (H) 01/26/2024 0328   GLUCOSE 310 (H) 08/30/2013 1025   BUN 64 (H) 01/26/2024 0328   BUN 97 (HH) 07/14/2023 1412   BUN 17 08/30/2013 1025   CREATININE 9.00 (H) 01/26/2024 0328   CREATININE 1.15 08/30/2013 1025   CALCIUM  8.9 01/26/2024 0328   CALCIUM  9.4 08/30/2013 1025   GFRNONAA 6 (L) 01/26/2024 0328   GFRNONAA >60 08/30/2013 1025   GFRAA 27 (L) 02/22/2020 0539   GFRAA >60 08/30/2013 1025    INR    Component Value Date/Time   INR 1.1 01/19/2024 1844    No intake or  output data in the 24 hours ending 01/26/24 0737   Assessment/Plan:  68 y.o. male is s/p L TMA 6 Days Post-Op   L TMA with large necrotic eschar, bloody drainage from central aspect Having a lot of associated pain will require BKA vs AKA. Discussed better healing chances with AKA. Patient did not really voice opinion regarding either Disp CIR possibly pending need for more proximal amputation   [name], PA-C [facility] [phone] 01/26/2024 7:37 AM

## 2024-01-26 NOTE — Telephone Encounter (Signed)
 Faxed outside referral for Phase II Cardiac Rehab to [facility].

## 2024-01-27 ENCOUNTER — Other Ambulatory Visit: Payer: Self-pay

## 2024-01-27 DIAGNOSIS — D638 Anemia in other chronic diseases classified elsewhere: Secondary | ICD-10-CM | POA: Diagnosis not present

## 2024-01-27 DIAGNOSIS — Z952 Presence of prosthetic heart valve: Secondary | ICD-10-CM | POA: Diagnosis not present

## 2024-01-27 DIAGNOSIS — I2510 Atherosclerotic heart disease of native coronary artery without angina pectoris: Secondary | ICD-10-CM | POA: Diagnosis not present

## 2024-01-27 DIAGNOSIS — I10 Essential (primary) hypertension: Secondary | ICD-10-CM | POA: Diagnosis not present

## 2024-01-27 LAB — MAGNESIUM: Magnesium: 2.3 mg/dL (ref 1.7–2.4)

## 2024-01-27 LAB — BASIC METABOLIC PANEL WITH GFR
Anion gap: 16 — ABNORMAL HIGH (ref 5–15)
BUN: 82 mg/dL — ABNORMAL HIGH (ref 8–23)
CO2: 24 mmol/L (ref 22–32)
Calcium: 8.7 mg/dL — ABNORMAL LOW (ref 8.9–10.3)
Chloride: 92 mmol/L — ABNORMAL LOW (ref 98–111)
Creatinine, Ser: 10.35 mg/dL — ABNORMAL HIGH (ref 0.61–1.24)
GFR, Estimated: 5 mL/min — ABNORMAL LOW (ref >60)
Glucose, Bld: 112 mg/dL — ABNORMAL HIGH (ref 70–99)
Potassium: 4.3 mmol/L (ref 3.5–5.1)
Sodium: 132 mmol/L — ABNORMAL LOW (ref 135–145)

## 2024-01-27 LAB — CBC
HCT: 30.3 % — ABNORMAL LOW (ref 39.0–52.0)
Hemoglobin: 9.8 g/dL — ABNORMAL LOW (ref 13.0–17.0)
MCH: 30.3 pg (ref 26.0–34.0)
MCHC: 32.3 g/dL (ref 30.0–36.0)
MCV: 93.8 fL (ref 80.0–100.0)
Platelets: 159 K/uL (ref 150–400)
RBC: 3.23 MIL/uL — ABNORMAL LOW (ref 4.22–5.81)
RDW: 15 % (ref 11.5–15.5)
WBC: 11 K/uL — ABNORMAL HIGH (ref 4.0–10.5)
nRBC: 0 % (ref 0.0–0.2)

## 2024-01-27 LAB — GLUCOSE, CAPILLARY
Glucose-Capillary: 126 mg/dL — ABNORMAL HIGH (ref 70–99)
Glucose-Capillary: 147 mg/dL — ABNORMAL HIGH (ref 70–99)
Glucose-Capillary: 90 mg/dL (ref 70–99)

## 2024-01-27 MED ORDER — PENTAFLUOROPROP-TETRAFLUOROETH EX AERO: 1.0000 | INHALATION_SPRAY | CUTANEOUS | Status: DC | PRN

## 2024-01-27 MED ORDER — ANTICOAGULANT SODIUM CITRATE 4% (200MG/5ML) IV SOLN: 5.0000 mL | Status: DC | PRN

## 2024-01-27 NOTE — Plan of Care (Signed)
  Problem: Activity: Goal: Risk for activity intolerance will decrease Outcome: Progressing   Problem: Nutrition: Goal: Adequate nutrition will be maintained Outcome: Progressing   Problem: Coping: Goal: Level of anxiety will decrease Outcome: Progressing   Problem: Pain Managment: Goal: General experience of comfort will improve and/or be controlled Outcome: Progressing   Problem: Safety: Goal: Ability to remain free from injury will improve Outcome: Progressing

## 2024-01-27 NOTE — Progress Notes (Signed)
 [facility] Progress Note   Dialysis Orders: [place] HD TTS - No heparin  - Left AVF (in use for 2 weeks) - EDW 81 kg - will need EDW lowered at d/c 4 hr 350/800 2K/2.5 Ca Bicarb 39 Mircera: 50 mcg ordered - but has not received any ESA yet No VDRA Renvela  800 mg TID WC   Assessment/Plan: PAD/ Left third toe gangrene: 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 Then TMA completed on 01/17/24 by Dr. Serene.  Plans per [facility].  Severe Aortic Stenosis: s/p TAVR 01/20/24 Cardiology following. Zio monitor in place.  ESRD:  Usual HD TTS.  Seen on dialysis through a left upper arm access 3K bath, no ultrafiltration as he appears to be euvolemic Systolic 116  Hypertension/volume: BP in goal. On carvedilol  3.125mg  BID - hold in AM on HD days. Well below EDW now. Minimal UF with HD.  Chronic HFrEF: Appears euvolemic.   Recent acute cholecystitis requiring cholecystostomy drain 6/25: drain is in place.  Anemia: Hgb 10-11.  No indication for ESA at this time.  Metabolic bone disease: restarted home Sevelamer  800 TID WC. Phos ordered for tomorrow.. Not on VDRA at his OP clinic. Continue home binder DMT2: Insulin  per primary.  Nutrition:  Albumin  low. Cont prot supp. On renal diet with fluid restriction  Subjective:  Seen on dialysis; denies any fever chills nausea vomiting.  Still some discomfort in the left foot after the TMA but pain seems to be fairly well controlled    Objective Vitals:   01/27/24 0814 01/27/24 0830 01/27/24 0845 01/27/24 0900  BP: 109/62  99/64 116/68  Pulse: 62 64  62  Resp: 13 10  11   Temp:      TempSrc:      SpO2: 99% 100%  100%  Weight:      Height:       Physical Exam General: Alert, sitting up in bed, nad  Heart: RRR Lungs: Clear  Abdomen: soft, NTND Extremities: no LE edema, L TMA, necrosis at incision  Dialysis Access: LU AVF +b/t   Filed Weights   01/26/24 0256 01/27/24 0314 01/27/24 0800  Weight: 76 kg 77.7 kg 73.1 kg   No intake  or output data in the 24 hours ending 01/27/24 0930    Additional Objective Labs: Basic Metabolic Panel: Recent Labs  Lab 01/25/24 0329 01/26/24 0328 01/27/24 0435  NA 135 134* 132*  K 4.6 4.4 4.3  CL 94* 94* 92*  CO2 22 25 24   GLUCOSE 97 145* 112*  BUN 46* 64* 82*  CREATININE 7.03* 9.00* 10.35*  CALCIUM  8.8* 8.9 8.7*   Liver Function Tests: Recent Labs  Lab 01/26/24 0328  AST 17  ALT <5  ALKPHOS 96  BILITOT 0.8  PROT 8.4*  ALBUMIN  2.4*   CBC: Recent Labs  Lab 01/23/24 0418 01/24/24 0353 01/25/24 0329 01/26/24 0328 01/27/24 0435  WBC 13.3* 12.2* 13.8* 11.8* 11.0*  HGB 10.6* 10.8* 10.1* 9.8* 9.8*  HCT 32.9* 33.7* 31.8* 30.1* 30.3*  MCV 93.5 93.9 94.6 94.1 93.8  PLT 178 178 188 170 159   CBG: Recent Labs  Lab 01/26/24 0603 01/26/24 1158 01/26/24 1702 01/26/24 2118 01/27/24 0613  GLUCAP 127* 190* 122* 92 126*    Medications:  sodium chloride      anticoagulant sodium citrate      norepinephrine  (LEVOPHED ) Adult infusion      (feeding supplement) PROSource Plus  30 mL Oral BID BM   aspirin  EC  81 mg Oral Daily  atorvastatin   20 mg Oral Daily   carvedilol   3.125 mg Oral BID   Chlorhexidine  Gluconate Cloth  6 each Topical Q0600   Chlorhexidine  Gluconate Cloth  6 each Topical Q0600   clopidogrel   75 mg Oral Daily   heparin   5,000 Units Subcutaneous Q8H   isosorbide  mononitrate  120 mg Oral Daily   levothyroxine   50 mcg Oral Daily   sevelamer  carbonate  800 mg Oral TID WC   sodium chloride  flush  3 mL Intravenous Q12H   tamsulosin   0.4 mg Oral QPC breakfast

## 2024-01-27 NOTE — Progress Notes (Signed)
 Patient seen and examined in dialysis. Left TMA amputation site is not healing Discussed with the patient that we will need to consider below-knee versus above-knee amputation.  He understands this and needs time to process it.  I am tentatively getting him on the schedule for Friday  [facility]

## 2024-01-27 NOTE — Progress Notes (Signed)
 Inpatient Rehab Admissions Coordinator:   Updated HTA regarding pending surgery hopeful for Friday.  Auth for CIR will expire, and we will resubmit if/when appropriate.   Reche Lowers, PT, DPT Admissions Coordinator [phone] 01/27/24  1:46 PM

## 2024-01-27 NOTE — Progress Notes (Signed)
 Progress Note   Patient: Phillip [name] FMW:[id number] DOB: 30-Jun-1955 DOA: 01/14/2024  DOS: the patient was seen and examined on 01/27/2024   Brief hospital course:  68/M w ESRD on HD TTS, DM-1, recent acute cholecystitis June 2025) requiring cholecystostomy tube placement, chronic HFrEF, aortic stenosis (TAVR postponed due to left third toe gangrene), PAD-s/p PCI to left tibia-fibula/angioplasty on 7/25 who was admitted by vascular surgery 8/13 for an elective left third toe amputation. H/o significant issues with nonhealing ulceration of his left foot-progressing into a gangrenous left third toe, underwent a angiogram of LLE with transluminal angioplasty/stent placement to the left tibioperoneal trunk on 7/25 at [facility]. Plans were to proceed with a left third toe amputation post revascularization. However due to his significant underlying medical comorbidities, this could not be done at [facility], referred to the [facility] surgery and admitted to [facility].   8/13 - left 3rd toe amputation 8/15 - left TMA and wound vac placment 8/19 - TAVR 8/22 - wound vac removed   Assessment and Plan:   Left third toe gangrene/MTA foot necrosis - S/p transmetatarsal amputation with wound VAC placement 8/15.  Wound VAC removed 8/22.  Unfortunately TMA wound continues to be dusky and necrotic appearing.  Will likely warrant further amputation.  Vascular surgery following closely.  Decision about BKA versus AKA as well as timing, likely friday.   Severe aortic stenosis - S/p TAVR 8/19.  Cardiology following closely.  Zio patch initially placed for ambulatory monitoring however has since been discontinued.   Chronic HFrEF - Appears euvolemic at this time.  Volume removal per HD/UF.   ESRD - HD TTS.  Nephrology following closely.  LUE AVF.   CAD with CABG - Last cath 2/25 noting severe CAD with all vein grafts occluded.  Consider holding aspirin , Plavix  for impending surgery.   Recent history of acute  cholecystitis requiring cholecystostomy drain-11/2023 - Percutaneous cholecystectomy drain in place.     Diabetes mellitus - A1c 5.8 suggesting excellent control.  Insulin  sliding scale more.   Normocytic anemia - Likely anemia of chronic kidney disease.  Hemoglobin stable.   Hypothyroidism - Synthroid  on board.   Goals of care - Currently being evaluated for inpatient rehab/CIR.  Working closely with TOC on disposition planning.  However extremity appears more necrotic, likely to pursue further amputation so transition to CIR on hold.   Subjective: Patient resting comfortably this morning in bedside chair.  Denies any shortness of breath, chest pain, nausea, vomiting, abdominal pain, fevers.  Was informed about needing further amputation and still mulling it over.  Physical Exam:  Vitals:   01/27/24 1147 01/27/24 1154 01/27/24 1226 01/27/24 1255  BP: 109/62 124/74  119/80  Pulse:      Resp:    16  Temp:    97.6 F (36.4 C)  TempSrc:    Oral  SpO2:    100%  Weight:   73.1 kg   Height:        GENERAL:  Alert, pleasant, no acute distress  HEENT:  EOMI CARDIOVASCULAR:  RRR, no murmurs appreciated RESPIRATORY:  Clear to auscultation, no wheezing, rales, or rhonchi GASTROINTESTINAL:  Soft, nontender, nondistended EXTREMITIES:  No LE edema bilaterally NEURO:  No new focal deficits appreciated SKIN: LLE TMA, dusky/necrotic distally, minimal sanguinous discharge PSYCH:  Appropriate mood and affect    Data Reviewed:  Imaging Studies: ECHOCARDIOGRAM COMPLETE Result Date: 01/21/2024    ECHOCARDIOGRAM REPORT   Patient Name:   [name] Date of Exam: 01/21/2024  Medical Rec #:  [id number]        Height:       72.0 in Accession #:    [id number]       Weight:       165.6 lb Date of Birth:  1956/02/03       BSA:          1.966 m Patient Age:    68 years         BP:           128/75 mmHg Patient Gender: M                HR:           77 bpm. Exam Location:  Inpatient Procedure: 2D  Echo, Cardiac Doppler and Color Doppler (Both Spectral and Color            Flow Doppler were utilized during procedure). Indications:    Post TAVR evaluation. ; I35.0 Nonrheumatic aortic (valve)                 stenosis  History:        Patient has prior history of Echocardiogram examinations, most                 recent 01/20/2024. CHF, CAD, Prior CABG, Pulmonary HTN, Aortic                 Valve Disease, Signs/Symptoms:Edema; Risk Factors:Hypertension                 and Dyslipidemia. ESRD.                 Aortic Valve: 29 mm Edwards Sapien prosthetic, stented (TAVR)                 valve is present in the aortic position. Procedure Date:                 01/20/2024.  Sonographer:    Ellouise Mose RDCS Referring Phys: [id number] [facility] R THOMPSON  Sonographer Comments: Technically difficult study due to poor echo windows. IMPRESSIONS  1. Left ventricular ejection fraction, by estimation, is 30 to 35%. The left ventricle has moderately decreased function. The left ventricle demonstrates regional wall motion abnormalities (see scoring diagram/findings for description). The left ventricular internal cavity size was moderately dilated. There is mild concentric left ventricular hypertrophy. Left ventricular diastolic parameters are consistent with Grade I diastolic dysfunction (impaired relaxation).  2. The mitral valve is degenerative. Mild to moderate mitral valve regurgitation. No evidence of mitral stenosis. Moderate mitral annular calcification.  3. The aortic valve has been repaired/replaced. There is a 29 mm Edwards Sapien prosthetic (TAVR) valve present in the aortic position. Procedure Date: 01/20/2024. Echo findings are consistent with trivial perivalvular leak of the aortic prosthesis.  4. Right ventricular systolic function is moderately reduced. The right ventricular size is normal.  5. Left atrial size was mildly dilated. FINDINGS  Left Ventricle: Left ventricular ejection fraction, by estimation, is 30 to 35%.  The left ventricle has moderately decreased function. The left ventricle demonstrates regional wall motion abnormalities. The left ventricular internal cavity size was moderately dilated. There is mild concentric left ventricular hypertrophy. Left ventricular diastolic parameters are consistent with Grade I diastolic dysfunction (impaired relaxation).  LV Wall Scoring: The inferior wall and posterior wall are akinetic. Right Ventricle: The right ventricular size is normal. No increase in right ventricular wall thickness. Right ventricular systolic function is moderately reduced. Left  Atrium: Left atrial size was mildly dilated. Right Atrium: Right atrial size was normal in size. Pericardium: There is no evidence of pericardial effusion. Mitral Valve: The mitral valve is degenerative in appearance. Moderate mitral annular calcification. Mild to moderate mitral valve regurgitation. No evidence of mitral valve stenosis. MV peak gradient, 7.8 mmHg. The mean mitral valve gradient is 3.0 mmHg. Tricuspid Valve: The tricuspid valve is normal in structure. Tricuspid valve regurgitation is trivial. No evidence of tricuspid stenosis. Aortic Valve: Trivial perivalvular leak and 2 o'clock. The aortic valve has been repaired/replaced. Aortic valve regurgitation is not visualized. No aortic stenosis is present. Aortic valve mean gradient measures 5.0 mmHg. Aortic valve peak gradient measures 10.2 mmHg. Aortic valve area, by VTI measures 4.55 cm. There is a 29 mm Edwards Sapien prosthetic, stented (TAVR) valve present in the aortic position. Procedure Date: 01/20/2024. Echo findings are consistent with normal structure and function of the aortic valve prosthesis. Pulmonic Valve: The pulmonic valve was normal in structure. Pulmonic valve regurgitation is not visualized. No evidence of pulmonic stenosis. Aorta: The aortic root and ascending aorta are structurally normal, with no evidence of dilitation. IAS/Shunts: There is redundancy  of the interatrial septum. No atrial level shunt detected by color flow Doppler.  LEFT VENTRICLE PLAX 2D LVIDd:         5.18 cm      Diastology LVIDs:         4.80 cm      LV e' medial:    3.92 cm/s LV PW:         1.28 cm      LV E/e' medial:  22.8 LV IVS:        1.39 cm      LV e' lateral:   5.55 cm/s LVOT diam:     2.90 cm      LV E/e' lateral: 16.1 LV SV:         124 LV SV Index:   63 LVOT Area:     6.61 cm  LV Volumes (MOD) LV vol d, MOD A2C: 277.0 ml LV vol d, MOD A4C: 198.0 ml LV vol s, MOD A2C: 206.0 ml LV vol s, MOD A4C: 138.0 ml LV SV MOD A2C:     71.0 ml LV SV MOD A4C:     198.0 ml LV SV MOD BP:      71.8 ml RIGHT VENTRICLE            IVC RV S prime:     7.40 cm/s  IVC diam: 1.70 cm TAPSE (M-mode): 0.7 cm LEFT ATRIUM             Index        RIGHT ATRIUM           Index LA diam:        3.96 cm 2.01 cm/m   RA Area:     16.20 cm LA Vol (A2C):   38.2 ml 19.43 ml/m  RA Volume:   46.30 ml  23.55 ml/m LA Vol (A4C):   28.2 ml 14.34 ml/m LA Biplane Vol: 32.4 ml 16.48 ml/m  AORTIC VALVE AV Area (Vmax):    5.08 cm AV Area (Vmean):   4.73 cm AV Area (VTI):     4.55 cm AV Vmax:           160.00 cm/s AV Vmean:          100.467 cm/s AV VTI:  0.273 m AV Peak Grad:      10.2 mmHg AV Mean Grad:      5.0 mmHg LVOT Vmax:         123.00 cm/s LVOT Vmean:        71.900 cm/s LVOT VTI:          0.188 m LVOT/AV VTI ratio: 0.69  AORTA Ao Root diam: 3.51 cm Ao Asc diam:  3.59 cm MITRAL VALVE MV Area (PHT): 4.15 cm     SHUNTS MV Area VTI:   3.42 cm     Systemic VTI:  0.19 m MV Peak grad:  7.8 mmHg     Systemic Diam: 2.90 cm MV Mean grad:  3.0 mmHg MV Vmax:       1.40 m/s MV Vmean:      88.5 cm/s MV Decel Time: 183 msec MV E velocity: 89.40 cm/s MV A velocity: 127.00 cm/s MV E/A ratio:  0.70 Toribio Fuel MD Electronically signed by Toribio Fuel MD Signature Date/Time: 01/21/2024/12:14:45 PM    Final    ECHOCARDIOGRAM LIMITED Result Date: 01/20/2024    ECHOCARDIOGRAM LIMITED REPORT   Patient Name:    BODI PALMERI Date of Exam: 01/20/2024 Medical Rec #:  [id number]        Height:       72.0 in Accession #:    [id number]       Weight:       165.8 lb Date of Birth:  Oct 23, 1955       BSA:          1.967 m Patient Age:    68 years         BP:           105/60 mmHg Patient Gender: M                HR:           61 bpm. Exam Location:  Inpatient Procedure: Limited Color Doppler (Both Spectral and Color Flow Doppler were            utilized during procedure). Indications:     aortic stenosis. TAVR  History:         Patient has prior history of Echocardiogram examinations, most                  recent 08/01/2023. CAD, Prior CABG, end stage renal disease;                  Risk Factors:Hypertension, Dyslipidemia, Diabetes and Sleep                  Apnea.                  Aortic Valve: 29 mm valve is present in the aortic position.                  Procedure Date: 01/20/2024.  Sonographer:     Tinnie Barefoot RDCS Referring Phys:  [id number] [facility] THOMPSON Diagnosing Phys: Darryle Decent MD IMPRESSIONS  1. Echo guided TAVR. Vmax 1.21 m/s, MG 3.0 mmHG, EOA 4.57 cm2. No regurgitation or paravalvular leak. Aortic valve regurgitation is not visualized. There is a 29 mm valve present in the aortic position. Procedure Date: 01/20/2024. Aortic valve area, by VTI measures 4.57 cm. Aortic valve mean gradient measures 3.0 mmHg. Aortic valve Vmax measures 1.21 m/s.  2. Left ventricular ejection fraction, by estimation, is 30 to 35%. The left ventricle has moderately  decreased function.  3. Right ventricular systolic function is normal. The right ventricular size is normal.  4. The mitral valve is degenerative. Mild mitral valve regurgitation. No evidence of mitral stenosis. FINDINGS  Left Ventricle: Left ventricular ejection fraction, by estimation, is 30 to 35%. The left ventricle has moderately decreased function.  LV Wall Scoring: The inferior wall and posterior wall are akinetic. Right Ventricle: The right ventricular size is  normal. No increase in right ventricular wall thickness. Right ventricular systolic function is normal. Pericardium: There is no evidence of pericardial effusion. Mitral Valve: The mitral valve is degenerative in appearance. Mild mitral valve regurgitation. No evidence of mitral valve stenosis. Tricuspid Valve: The tricuspid valve is grossly normal. Tricuspid valve regurgitation is mild . No evidence of tricuspid stenosis. Aortic Valve: Echo guided TAVR. Vmax 1.21 m/s, MG 3.0 mmHG, EOA 4.57 cm2. No regurgitation or paravalvular leak. Aortic valve regurgitation is not visualized. Aortic regurgitation PHT measures 446 msec. Aortic valve mean gradient measures 3.0 mmHg. Aortic valve peak gradient measures 5.9 mmHg. Aortic valve area, by VTI measures 4.57 cm. There is a 29 mm valve present in the aortic position. Procedure Date: 01/20/2024. Aorta: The aortic root and ascending aorta are structurally normal, with no evidence of dilitation. Additional Comments: Spectral Doppler performed. Color Doppler performed.  LEFT VENTRICLE PLAX 2D LVIDd:         5.30 cm LV PW:         1.00 cm LV IVS:        1.20 cm LVOT diam:     2.68 cm LV SV:         105 LV SV Index:   54 LVOT Area:     5.64 cm  LV Volumes (MOD) LV vol d, MOD A4C: 181.0 ml LV vol s, MOD A4C: 117.0 ml LV SV MOD A4C:     181.0 ml LEFT ATRIUM         Index LA diam:    3.70 cm 1.88 cm/m  AORTIC VALVE AV Area (Vmax):    4.90 cm AV Area (Vmean):   4.80 cm AV Area (VTI):     4.57 cm AV Vmax:           121.00 cm/s AV Vmean:          80.400 cm/s AV VTI:            0.231 m AV Peak Grad:      5.9 mmHg AV Mean Grad:      3.0 mmHg LVOT Vmax:         105.00 cm/s LVOT Vmean:        68.400 cm/s LVOT VTI:          0.187 m LVOT/AV VTI ratio: 0.81 AI PHT:            446 msec  AORTA Ao Root diam: 3.40 cm Ao Asc diam:  3.30 cm  SHUNTS Systemic VTI:  0.19 m Systemic Diam: 2.68 cm Darryle Decent MD Electronically signed by Darryle Decent MD Signature Date/Time: 01/20/2024/6:03:09 PM     Final    Structural Heart Procedure Result Date: 01/20/2024 See surgical note for result.  IR Radiologist Eval & Mgmt Result Date: 01/01/2024 : Chief Complaint:Patient was seen in consultation today for acute cholecystitis s/p percutaneous cholecystostomy drain placement,Referring Physician(s):Dr. Dreama Hanger, MD Supervising Physician: Johann, DanielHistory of Present Illness:Antavion D Lorentz is a 68 y.o. male with past medical history of ESRD on HD, severe aortic stenosis planning on TAVR  soon, HFrEF, NSTEMI 2022, CAD s/p CABG in 2003, GERD, DM2, HTN, thrombocytopenia, and prior lap appendectomy in 2020 who presented to [facility] ED with acute abdominal pain after he was found to be febrile at his dialysis center 6/10. Initially his RUQ US  was nonspecific for acute cholecystitis despite RUQ pain, tachycardia, fever, and elevated lactic acid. However, subsequent MR Abdomen/MRCP showed a distended gallbladder with innumerable stones, without biliary duct dilation. Due to notable cardiac and ESRD comorbidities, patient was recommended for percutaneous cholecystostomy drain placement, which was placed in IR on 6/11. He improved subsequently, and eventually discharged with multiple follow-up appointments, including with outpatient IR clinic. His planned TAVR pprocedure was postponed in light of his acute cholecystitis.Patient has been seen by cardiology and vascular surgery. His TAVR remains pending. He was informed by his cardiology team that his potential cholecystectomy will have to be deferred pending cardiac evaluation, TAVR procedure, and eventual clearance.Patient presents to IR clinic today for 7 week follow-up. He endorses expected discomfort from his drain, but other is overall well. He is tired since his dialysis session yesterday. He has a necrotic toe, being evaluated, that causes him pain. His wife is at his side. His drain has good bilious output daily. His wife flushes it daily. He denies fevers,  chills, abdominal pain, nausea, vomiting. His surgical follow-up appointment is scheduled for 8/11 with Dr. Dasie. Past Medical History: Diagnosis * : Date . * : Abdominopelvic abscess (HCC) * : 09/29/2018 . * : Abscess of appendix * : 09/22/2018 . * : Acquired spondylolisthesis of lumbosacral region * : 03/20/2020 . * : Acute cholecystitis * : 11/15/2023 . * : Acute kidney injury (HCC) * : 10/07/2018 . * : Acute kidney injury superimposed on chronic kidney disease (HCC) * : 11/06/2020 . * : Acute kidney injury superimposed on CKD (HCC) * : 02/18/2020 . * : Acute on chronic systolic (congestive) heart failure (HCC) * : 07/26/2015 * : Formatting of this note might be different from the original. Formatting of this note might be different from the original. Formatting of this note might be different from the original. revenkar EF 35% Formatting of this note might be different from the original. revenkar EF 35% . * : Acute on chronic systolic CHF (congestive heart failure) (HCC) * : 08/12/2023 . * : Anasarca * : 07/01/2023 . * : Anemia of chronic disease * : 08/18/2019 . * : Anemia, chronic disease * : 08/18/2019 . * : Aortic atherosclerosis (HCC) * : . * : Aortic stenosis * : * : a.) TTE 09/15/2018: mild-mod (MPG 19); b.) TTE 03/21/2021: mild-mod (MPG 14.3); c.) TTE 04/24/2022: mild- mod (MPG 15) . * : Aortic stenosis, moderate * : 10/15/2019 . * : Appendicitis * : 09/08/2018 . * : ARF (acute renal failure) (HCC) * : 02/19/2020 . * : Asthma * : . * : Atherosclerosis of native arteries of the extremities with ulceration (HCC) * : 02/24/2018 * : Formatting of this note might be different from the original. Last Assessment & Plan: His ABIs today are 1.07 on the right and 0.99 on the left with biphasic waveforms. Although these pressures may be somewhat elevated from medial calcification, his flow does appear to be reasonably good. His left ABI was 0.58 prior to intervention. At this point, we will stretch out his  follow-up and see hi . * : Benign hypertension with CKD (chronic kidney disease) stage IV (HCC) * : 02/24/2018 . * : Benign prostatic hyperplasia without  lower urinary tract symptoms * : 01/14/2018 . * : Bilateral lower extremity edema * : 11/04/2018 . * : Bradycardia * : 03/25/2018 . * : Bruit of right carotid artery * : 07/26/2015 . * : CAD (coronary artery disease) * : 2003 * : a.) s/p 4v CABG 2003 . * : Cardiac murmur * : 07/26/2015 . * : Choledocholithiasis * : 11/12/2023 . * : Chronic combined systolic and diastolic CHF (congestive heart failure) (HCC) * : 07/26/2015 * : a.) TTE 09/15/2018: EF 50-55%, mod LVH, RVE, BAE, mild-mod TR, AoV sclerosis, G1DD; b.) MPI 10/27/2019: EF <30%; c.) TTE 03/21/2021: EF 35-40%, post AK, inf HK, mod LVH, mod red RVSF, mod LAE, mod Aov sclerosis, G2DD; c.) TTE 04/24/2022: EF 35-40%, post AK, glok HK, mod LVE, mod red RVSF, mild-mod MR, Aov sclerosis, G3DD . * : Chronic idiopathic constipation * : 11/02/2018 . * : Chronic low back pain without sciatica * : 12/02/2018 . * : Chronic pain of right hip * : 03/31/2020 . * : Chronic systolic heart failure (HCC) * : 07/26/2015 * : Formatting of this note might be different from the original. revenkar EF 35% . * : CKD (chronic kidney disease) stage 4, GFR 15-29 ml/min (HCC) * : 10/07/2018 . * : Congestive heart failure (HCC) * : 07/26/2015 * : Last Assessment & Plan: Poor cardiac function could certainly be contributing to poor blood supply to the feet and toes as well. Formatting of this note might be different from the original. Formatting of this note might be different from the original. Last Assessment & Plan: Poor cardiac function could certainly be contributing to poor blood supply to the feet and toes as well. Last . * : Coronary artery disease * : . * : Diabetes mellitus without complication (HCC) * : . * : Dysphagia * : 07/26/2015 . * : Encounter for long-term (current) use of insulin  (HCC) * : 09/27/2020 . * : Erectile  dysfunction * : 07/14/2017 . * : ESRD on dialysis (HCC) * : 11/15/2023 . * : Essential hypertension * : 02/24/2018 . * : Fever * : 11/07/2020 . * : Foot ulcer (HCC) * : 09/27/2020 * : left foot Formatting of this note might be different from the original. Formatting of this note might be different from the original. left foot . * : Foot ulcer, left (HCC) * : . * : GERD (gastroesophageal reflux disease) * : . * : Heart murmur * : 07/26/2015 . * : Hematuria * : 07/27/2015 . * : High risk medication use * : 07/26/2015 . * : History of marijuana use * : . * : Hyperkalemia * : 05/29/2020 . * : Hyperlipidemia * : 07/26/2015 . * : Hyperlipidemia associated with type 2 diabetes mellitus (HCC) * : 02/18/2020 . * : Hypertension * : . * : Hypertension associated with diabetes (HCC) * : . * : Hypoglycemia * : 05/07/2019 . * : Hypothyroidism * : 11/14/2015 . * : Hypothyroidism (acquired) * : 11/14/2015 . * : Insomnia * : 11/02/2018 . * : Kidney insufficiency * : . * : Lumbar spondylosis * : 03/20/2020 . * : Malaise and fatigue * : 03/25/2018 . * : Malnutrition of moderate degree * : 08/20/2023 . * : Mitral regurgitation * : 05/12/2019 . * : Moderate aortic regurgitation * : 05/12/2019 . * : Myocardial infarction due to demand ischemia [facility]) * : 09/26/2020 * : a.) Type II NSTEMI; b.) troponins were  trended 0.54 --> 0.56 --> 0.52 ng/mL . * : Non-compliance * : 05/05/2019 . * : NSTEMI (non-ST elevated myocardial infarction) (HCC) * : 09/27/2020 . * : Osseous and subluxation stenosis of intervertebral foramina of lumbar region * : 03/20/2020 . * : Osteoarthritis * : 10/06/2019 . * : Pancytopenia (HCC) * : 08/12/2023 . * : Peripheral vascular disease (HCC) * : . * : Personal history of tobacco use, presenting hazards to health * : 09/27/2020 . * : Polyneuropathy in diabetes (HCC) * : 05/07/2019 . * : Primary osteoarthritis involving multiple joints * : 10/06/2019 . * : Primary osteoarthritis of right hip * : 02/21/2020 . * :  Pulmonary HTN (HCC) * : * : a.) TTE 05/12/2019: PASP 71; b.) TTE 09/27/2020: PASP >70; c.) TTE 03/21/2021: RVSP 43; d.) TTE 04/24/2022: RVSP 80.6 . * : Puncture wound of right hip * : 04/02/2018 . * : PVD (peripheral vascular disease) with claudication (HCC) * : * : a.) s/p PTA 03/02/2018 - balloon angioplasty LEFT below knee popliteal artery; b.) s/p PTA 09/30/2022: baloon angioplasty LEFT tibioperoneal trunck, most proximal peroneal artery, and LEFT popliteal artery. . * : Regional wall motion abnormality of heart * : 05/07/2019 . * : Right groin pain * : 02/18/2020 . * : S/P CABG x 4 * : 2003 . * : Screening for colon cancer * : 02/18/2018 . * : Sepsis (HCC) * : 11/15/2023 . * : Sleep apnea * : 07/26/2015 * : a.) does not require nocturnal PAP therapy . * : Subacute osteomyelitis of left foot (HCC) * : 03/25/2018 . * : Thrombocytopenia (HCC) * : . * : Type 2 diabetes mellitus with hyperlipidemia (HCC) * : 07/26/2015 . * : Type 2 diabetes mellitus with stage 4 chronic kidney disease, with long-term current use of insulin  (HCC) * : 07/26/2015 . * : Type II diabetes mellitus with neurological manifestations (HCC) * : 02/18/2020 . * : Vitamin B12 deficiency * : 06/16/2019 . * : Vitamin D deficiency * : 05/07/2018 Past Surgical History: Procedure * : Laterality * : Date . * : AMPUTATION TOE * : Left * : 03/13/2018 * : Procedure: AMPUTATION TOE-MPJ; Surgeon: Ashley Soulier, DPM; Location: [facility] ORS; Service: Podiatry; Laterality: Left; . * : ANGIOPLASTY * : * : . * : AV FISTULA PLACEMENT * : Left * : 08/20/2023 * : Procedure: ARTERIOVENOUS (AV) FISTULA CREATION; Surgeon: Gretta Lonni PARAS, MD; Location: [facility] OR; Service: Vascular; Laterality: Left; . * : CARDIAC CATHETERIZATION * : * : . * : CORONARY ARTERY BYPASS GRAFT * : N/A * : 2003 . * : IR FLUORO GUIDE CV LINE RIGHT * : * : 08/18/2023 . * : IR PERC CHOLECYSTOSTOMY * : * : 11/12/2023 . * : IR US  GUIDE VASC ACCESS RIGHT * : * : 08/18/2023 . * : LAPAROSCOPIC  APPENDECTOMY * : N/A * : 09/08/2018 * : Procedure: APPENDECTOMY LAPAROSCOPIC; Surgeon: Jordis Laneta FALCON, MD; Location: [facility] ORS; Service: General; Laterality: N/A; . * : LOWER EXTREMITY ANGIOGRAPHY * : Left * : 03/02/2018 * : Procedure: LOWER EXTREMITY ANGIOGRAPHY; Surgeon: Marea Selinda RAMAN, MD; Location: [facility]; Service: Cardiovascular; Laterality: Left; . * : LOWER EXTREMITY ANGIOGRAPHY * : Left * : 09/30/2022 * : Procedure: Lower Extremity Angiography; Surgeon: Marea Selinda RAMAN, MD; Location: [facility]; Service: Cardiovascular; Laterality: Left; . * : LOWER EXTREMITY ANGIOGRAPHY * : Left * : 12/26/2023 * : Procedure: Lower Extremity Angiography; Surgeon:  Marea Selinda RAMAN, MD; Location: [facility]; Service: Cardiovascular; Laterality: Left; . * : RIGHT/LEFT HEART CATH AND CORONARY/GRAFT ANGIOGRAPHY * : N/A * : 08/01/2023 * : Procedure: RIGHT/LEFT HEART CATH AND CORONARY/GRAFT ANGIOGRAPHY; Surgeon: Wendel Lurena POUR, MD; Location: [facility]; Service: Cardiovascular; Laterality: N/A; . * : ROTATOR CUFF REPAIR * : Left * : Allergies:Patient has no known allergies.Medications: Prior to Admission medications Medication * : Sig * : Start Date * : End Date * : Taking? * : Authorizing Provider acetaminophen  (TYLENOL ) 500 MG tablet * : Take 1,000 mg by mouth every 6 (six) hours as needed for moderate pain or headache. * : * : * : * : [provider] aspirin  EC 81 MG tablet * : Take 1 tablet (81 mg total) by mouth daily. Swallow whole. * : 07/25/23 * : * : * : Thukkani, Arun K, MD atorvastatin  (LIPITOR) 20 MG tablet * : Take 1 tablet (20 mg total) by mouth daily. * : 07/25/23 * : * : * : Thukkani, Arun K, MD carvedilol  (COREG ) 3.125 MG tablet * : Take 1 tablet (3.125 mg total) by mouth 2 (two) times daily. * : 05/20/23 * : 05/02/24 * : * : Carlin Delon BROCKS, NP clopidogrel  (PLAVIX ) 75 MG tablet * : Take 1 tablet (75 mg total) by mouth daily. * : 12/26/23 * : * : * : Dew, Selinda RAMAN, MD  gabapentin  (NEURONTIN ) 300 MG capsule * : Take 1 capsule (300 mg total) by mouth at bedtime.Patient taking differently: Take 300 mg by mouth 2 (two) times daily. * : 08/25/23 * : * : * : Arrien, Elidia Sieving, MD insulin  glargine (LANTUS ) 100 UNIT/ML injection * : Inject 7 Units into the skin daily as needed (Diabetes). Inject 7 units at bedtime as needed after checking glucose * : * : * : * : [provider] isosorbide  mononitrate (IMDUR ) 120 MG 24 hr tablet * : Take 1 tablet (120 mg total) by mouth daily. * : 08/27/23 * : * : * : Revankar, Jennifer SAUNDERS, MD latanoprost  (XALATAN ) 0.005 % ophthalmic solution * : Place 1 drop into both eyes at bedtime. * : 04/15/23 * : * : * : [provider] levothyroxine  (SYNTHROID ) 50 MCG tablet * : Take 50 mcg by mouth daily. * : 04/05/23 * : * : * : [provider] nitroGLYCERIN  (NITROSTAT ) 0.4 MG SL tablet * : Place 1 tablet (0.4 mg total) under the tongue every 5 (five) minutes as needed for chest pain. * : 11/20/20 * : * : * : Revankar, Jennifer SAUNDERS, MD sodium chloride  flush (NS) 0.9 % SOLN * : Inject 5 mLs into the vein daily for 15 days. flush drain daily with 5 cc NS, record output daily, dressing changes every 2-3 days or earlier if soiled * : 11/14/23 * : 12/15/23 * : * : Darci Pore, MD tamsulosin  (FLOMAX ) 0.4 MG CAPS capsule * : Take 0.4 mg by mouth daily after breakfast. * : 04/19/20 * : * : * : [provider] vitamin B-12 (CYANOCOBALAMIN ) 500 MCG tablet * : Take 500 mcg by mouth daily. * : * : * : * : [provider] Vitamin D, Ergocalciferol, (DRISDOL) 1.25 MG (50000 UNIT) CAPS capsule * : Take 50,000 Units by mouth once a week. Mondays * : 07/13/21 * : * : * : [provider] Family History Problem * : Relation * : Age of Onset . * :  Hyperlipidemia * : Mother * : . * : Hypertension * : Mother * : . * : Diabetes * : Mother * : . * : Heart attack * : Brother * : . * : Heart disease * : Brother * : . * : Lung disease *  : Father * : Socioeconomic History . * : Marital status: * : Married * : * : Spouse name: * : Not on file . * : Number of children: * : Not on file . * : Years of education: * : Not on file . * : Highest education level: * : Not on file Occupational History . * : Not on file Tobacco Use . * : Smoking status: * : Former * : * : Current packs/day: * : 0.00 * : * : Average packs/day: * : 0.5 packs/day for 25.0 years (12.5 ttl pk-yrs) * : * : Types: * : Cigarettes * : * : Start date: * : [birth year] * : * : Quit date: * : 2003 * : * : Years since quitting: * : 22.5 . * : Smokeless tobacco: * : Never Vaping Use . * : Vaping status: * : Never Used Substance and Sexual Activity . * : Alcohol use: * : Not Currently * : * : Comment: 2003 . * : Drug use: * : Never . * : Sexual activity: * : Not on file Other Topics * : Concern . * : Not on file Social History Narrative . * : Not on file Financial Resource Strain: Low Risk  (09/26/2020) * : Received from [facility] * : Overall Financial Resource Strain (CARDIA) * : . * : Difficulty of Paying Living Expenses: Not hard at all Food Insecurity: No Food Insecurity (11/12/2023) * : Hunger Vital Sign * : . * : Worried About Running Out of Food in the Last Year: Never true * : . * : Ran Out of Food in the Last Year: Never true Transportation Needs: No Transportation Needs (11/12/2023) * : PRAPARE - Transportation * : . * : Lack of Transportation (Medical): No * : . * : Lack of Transportation (Non-Medical): No Physical Activity: Inactive (09/26/2020) * : Received from [facility] * : Exercise Vital Sign * : . * : On average, how many days per week do you engage in moderate to strenuous exercise (like a brisk walk)?: 0 days * : . * : On average, how many minutes do you engage in exercise at this level?: 0 min Stress: No Stress Concern Present (09/26/2020) * : Received from [facility] * : [facility] of Occupational Health - Occupational Stress Questionnaire * : . * : Feeling of  Stress : Not at all Social Connections: Moderately Integrated (11/12/2023) * : Social Connection and Isolation Panel * : . * : Frequency of Communication with Friends and Family: Three times a week * : . * : Frequency of Social Gatherings with Friends and Family: Once a week * : . * : Attends Religious Services: 1 to 4 times per year * : . * : Active Member of Clubs or Organizations: No * : . * : Attends Club or Organization Meetings: Never * : . * : Marital Status: Married Review of Systems: A 12 point ROS discussed and pertinent positives are indicated in the HPI above. All other systems are negative.Physical ExamConstitutional: Appearance: Normal appearance. Abdominal: General: Abdomen is flat. Palpations: Abdomen is soft. Comments: RUQ drain appropriately  dressed. Dressing is clean, dry, intact.Drain incision site non-tender, without evidence of infection. Retaining suture and Stat Lock in place. Good bilious output in collection bag. Neurological: Mental Status: He is alert. Imaging: PERIPHERAL VASCULAR CATHETERIZATIONResult Date: 7/25/2025See surgical note for result. VAS US  ABI WITH/WO TBIResult Date: 12/23/2023 LOWER EXTREMITY DOPPLER STUDY Patient Name: TAMAR LIPSCOMB Date of Exam: 12/22/2023 Medical Rec #: [id number] Accession #: [id number] Date of Birth: Aug 13, 1955 Patient Gender: M Patient Age: 89 years Exam Location: [facility] Procedure: VAS US  ABI WITH/WO TBI Referring Phys: -------------------------------------------------------------------------------- Indications: Peripheral artery disease. Vascular Interventions: 09/30/2022 Percutaneous transluminal angioplasty of left tibioperoneal trunk and most proximal peroneal artery with 4 mm diameter by 6 cm length Lutonix drug-coated angioplasty balloon 5. Percutaneous transluminal angioplasty of left popliteal artery with 6 mm diameter by 8 cm length Lutonix drug-coated angioplasty balloon. Comparison Study: 09/2022 Performing Technologist:  Jerel Croak RVT Examination Guidelines: A complete evaluation includes at minimum, Doppler waveform signals and systolic blood pressure reading at the level of bilateral brachial, anterior tibial, and posterior tibial arteries, when vessel segments are accessible. Bilateral testing is considered an integral part of a complete examination. Photoelectric Plethysmograph (PPG) waveforms and toe systolic pressure readings are included as required and additional duplex testing as needed. Limited examinations for reoccurring indications may be performed as noted. ABI Findings: +---------+------------------+-----+----------+--------+ Right Rt Pressure (mmHg)IndexWaveform Comment  +---------+------------------+-----+----------+--------+ Brachial 118     +---------+------------------+-----+----------+--------+ ATA   monophasicNonComp  +---------+------------------+-----+----------+--------+ PTA   monophasicNonComp  +---------+------------------+-----+----------+--------+ Great Toe50 0.42 Abnormal   +---------+------------------+-----+----------+--------+ +---------+------------------+-----+--------+-------+ Left Lt Pressure (mmHg)IndexWaveformComment +---------+------------------+-----+--------+-------+ Brachial 120     +---------+------------------+-----+--------+-------+ ATA   biphasicNonComp +---------+------------------+-----+--------+-------+ PTA   biphasicNonComp +---------+------------------+-----+--------+-------+ Great Toe82 0.68 Abnormal  +---------+------------------+-----+--------+-------+ +-------+-----------+-------------+------------+------------+ ABI/TBIToday's ABIToday's TBI Previous ABIPrevious TBI +-------+-----------+-------------+------------+------------+ Right NonComp .42 NonComp .50  +-------+-----------+-------------+------------+------------+ Left NonComp .68 (2nd toe)NonComp Amp   +-------+-----------+-------------+------------+------------+ Bilateral ABIs appear essentially unchanged compared to prior study on 09/2022. Summary: Right: Resting right ankle-brachial index indicates noncompressible right lower extremity arteries. The right toe-brachial index is abnormal. Left: Resting left ankle-brachial index indicates noncompressible left lower extremity arteries. The left toe-brachial index is abnormal. Great toe amp; 2nd toe near normal TBI. *See table(s) above for measurements and observations. Electronically signed by Cordella Shawl MD on 12/23/2023 at 7:24:13 AM. Final Labs:CBC: Recent Labs 06/10/25193006/11/25063106/12/25065206/13/250552 WBC12.1*8.16.36.8HGB12.8*10.8*11.6*11.4*HCT38.8*32.9*36.8*35.1*PLT81*76*85*95* COAGS: Recent Labs 03/16/25022806/10/25193006/11/251011 INR1.4*1.4*1.3* BMP: Recent Labs 06/10/25193006/11/25063106/12/25065206/13/250552 WJ865*865*864864 K3.83.74.74.1CL94*96*94*95*CO225242225 GLUCOSE148*85175*112*BUN31*35*61*35*CALCIUM8.1*7.8*8.0*8.4*CREATININE4.93*5.52*7.54*5.48*GFRNONAA12*11*7*11* LIVER FUNCTION TESTS: Recent Labs 03/24/25031606/10/25193006/11/25063106/12/250821 BILITOT -- 4.1*4.8*2.4*AST -- 54*49*187*ALT -- 51*4297*ALKPHOS -- 841*865*876EMNU -- 8.07.08.1ALBUMIN3.63.2*2.7*2.7* TUMOR MARKERS: No results for input(s): AFPTM, CEA, CA199, CHROMGRNA in the last 8760 hours. Assessment and Plan:Acute cholecystitis s/p percutaneous cholecystostomy drain placement on 6/11. *Patient is well overall, with expected discomfort from drain. *Good daily bilious output into drainage bag. *Patient may flush drain every 3 days, or sooner with concerns of decreased output or clogged drain. *Patient is currently undergoing extensive cardiac evaluation and is pending TAVR procedure. He will have to obtain clearance by his cardiac team prior to undergoing potential cholecystectomy. *Keep follow-up appointment with Surgery service on 8/11. *IR will see patient in clinic every  6-8 weeks for regular drain exchanges until he is cleared for cholecystectomy, or alternate plan by care team. *Drain was exchanged today by Dr. Johann. Return in 6-8 weeks, on a recurring basis, for drain exchanges. *Return to clinic precautions given to patient and his wife. *Both patient and his wife voiced understanding and are amenable to this plan. Electronically Signed:Charles A Carim PA-C7/30/2025, 5:56 PM Electronically Signed   By: JONETTA Johann M.D.   On: 01/01/2024 11:10  IR Catheter Tube Change Result Date: 01/01/2024 INDICATION: Acute calculus cholecystitis, status post cholecystostomy catheter placement 11/12/2023, presents for scheduled exchange. No complicating features. EXAM: PERCUTANEOUS CHOLECYSTOSTOMY CATHETER EXCHANGE UNDER FLUOROSCOPY MEDICATIONS: No periprocedural antibiotics were indicated ANESTHESIA/SEDATION: Lidocaine  1% subcutaneous FLUOROSCOPY: Radiation Exposure Index (as provided by the fluoroscopic device): 22 mGy Kerma COMPLICATIONS: None immediate. PROCEDURE: Informed written consent was obtained from the patient after a thorough discussion of the procedural risks, benefits and alternatives. All questions were addressed. Maximal Sterile Barrier Technique was utilized including caps, mask, sterile gowns, sterile gloves, sterile drape, hand hygiene and skin antiseptic. A timeout was performed prior to the initiation of the procedure. Small contrast injection through the existing catheter confirmed placement within the gallbladder lumen. Catheter cut and exchanged over an angled Glidewire. A new 10 French per pigtail catheter was advanced and positioned centrally in the gallbladder. Contrast injection confirms good positioning. Catheter secured externally 0 Prolene suture and placed to external drain bag. The patient tolerated the procedure well. IMPRESSION: 1. Technically successful exchange of cholecystostomy catheter under fluoroscopy. Electronically Signed   By: JONETTA Faes M.D.    On: 01/01/2024 11:08    There are no new results to review at this time.  Previous records (including but not limited to H&P, progress notes, nursing notes, TOC management) were reviewed in assessment of this patient.  Labs: CBC: Recent Labs  Lab 01/23/24 0418 01/24/24 0353 01/25/24 0329 01/26/24 0328 01/27/24 0435  WBC 13.3* 12.2* 13.8* 11.8* 11.0*  HGB 10.6* 10.8* 10.1* 9.8* 9.8*  HCT 32.9* 33.7* 31.8* 30.1* 30.3*  MCV 93.5 93.9 94.6 94.1 93.8  PLT 178 178 188 170 159   Basic Metabolic Panel: Recent Labs  Lab 01/21/24 0331 01/22/24 0347 01/23/24 0418 01/24/24 0353 01/25/24 0329 01/26/24 0328 01/27/24 0435  NA 131*   < > 133* 134* 135 134* 132*  K 4.8   < > 4.8 4.7 4.6 4.4 4.3  CL 92*   < > 92* 92* 94* 94* 92*  CO2 22   < > 23 22 22 25 24   GLUCOSE 109*   < > 129* 122* 97 145* 112*  BUN 72*   < > 61* 78* 46* 64* 82*  CREATININE 10.74*   < > 8.48* 10.31* 7.03* 9.00* 10.35*  CALCIUM  8.5*   < > 9.1 8.7* 8.8* 8.9 8.7*  MG 2.2  --   --   --   --  2.3 2.3   < > = values in this interval not displayed.   Liver Function Tests: Recent Labs  Lab 01/26/24 0328  AST 17  ALT <5  ALKPHOS 96  BILITOT 0.8  PROT 8.4*  ALBUMIN  2.4*   CBG: Recent Labs  Lab 01/26/24 0603 01/26/24 1158 01/26/24 1702 01/26/24 2118 01/27/24 0613  GLUCAP 127* 190* 122* 92 126*    Scheduled Meds:  (feeding supplement) PROSource Plus  30 mL Oral BID BM   aspirin  EC  81 mg Oral Daily   atorvastatin   20 mg Oral Daily   carvedilol   3.125 mg Oral BID   Chlorhexidine  Gluconate Cloth  6 each Topical Q0600   Chlorhexidine  Gluconate Cloth  6 each Topical Q0600   clopidogrel   75 mg Oral Daily   heparin   5,000 Units Subcutaneous Q8H   isosorbide  mononitrate  120 mg Oral Daily   levothyroxine   50 mcg Oral Daily   sevelamer  carbonate  800 mg Oral TID WC   sodium chloride  flush  3 mL Intravenous Q12H   tamsulosin   0.4 mg Oral QPC breakfast   Continuous Infusions:  sodium chloride       norepinephrine  (LEVOPHED ) Adult infusion     PRN Meds:.sodium chloride , acetaminophen  **OR** acetaminophen , albuterol , bisacodyl , bisacodyl , HYDROmorphone  (DILAUDID ) injection, menthol -cetylpyridinium, nitroGLYCERIN , oxyCODONE , polyethylene glycol, sodium chloride  flush  Family Communication: None at bedside  Disposition: Status is: Inpatient Remains inpatient appropriate because: impending amputation     Time spent: 35 minutes  Length of inpatient stay: 13 days  Author: Carliss LELON Canales, DO 01/27/2024 1:55 PM  For on call review [email or web address].

## 2024-01-27 NOTE — Progress Notes (Signed)
 Received patient in bed.Alert and oriented x 4.Consent verified.  Access used: Left arm avf that worked well.  Duration of treatment: 3.5 hours.  Uf goal : Even.  Hand off to the patient's nurse ,back into his room with stable condition via transporter.

## 2024-01-27 NOTE — Progress Notes (Signed)
 PT Cancellation Note  Patient Details Name: Phillip [name] MRN: [id number] DOB: 1956-01-06   Cancelled Treatment:    Reason Eval/Treat Not Completed: (P) Patient at procedure or test/unavailable (pt off unit at HD dept) Will continue efforts later in day per PT plan of care as schedule permits.   Sharmin Foulk M Remijio Holleran 01/27/2024, 10:28 AM

## 2024-01-27 NOTE — Progress Notes (Signed)
 Received a call from pt's clinic with request that some labs be faxed to clinic for review/records. Labs faxed to clinic per their request. Will assist as needed.   Randine Mungo Dialysis Navigator [phone]

## 2024-01-28 ENCOUNTER — Ambulatory Visit: Admitting: Physician Assistant

## 2024-01-28 DIAGNOSIS — I96 Gangrene, not elsewhere classified: Secondary | ICD-10-CM | POA: Diagnosis not present

## 2024-01-28 LAB — GLUCOSE, CAPILLARY
Glucose-Capillary: 95 mg/dL (ref 70–99)
Glucose-Capillary: 163 mg/dL — ABNORMAL HIGH (ref 70–99)
Glucose-Capillary: 120 mg/dL — ABNORMAL HIGH (ref 70–99)
Glucose-Capillary: 147 mg/dL — ABNORMAL HIGH (ref 70–99)

## 2024-01-28 LAB — CBC
HCT: 30.1 % — ABNORMAL LOW (ref 39.0–52.0)
Hemoglobin: 9.6 g/dL — ABNORMAL LOW (ref 13.0–17.0)
MCH: 30.5 pg (ref 26.0–34.0)
MCHC: 31.9 g/dL (ref 30.0–36.0)
MCV: 95.6 fL (ref 80.0–100.0)
Platelets: 148 K/uL — ABNORMAL LOW (ref 150–400)
RBC: 3.15 MIL/uL — ABNORMAL LOW (ref 4.22–5.81)
RDW: 15.2 % (ref 11.5–15.5)
WBC: 11 K/uL — ABNORMAL HIGH (ref 4.0–10.5)
nRBC: 0 % (ref 0.0–0.2)

## 2024-01-28 LAB — BASIC METABOLIC PANEL WITH GFR
Anion gap: 16 — ABNORMAL HIGH (ref 5–15)
BUN: 42 mg/dL — ABNORMAL HIGH (ref 8–23)
CO2: 25 mmol/L (ref 22–32)
Calcium: 8.6 mg/dL — ABNORMAL LOW (ref 8.9–10.3)
Chloride: 93 mmol/L — ABNORMAL LOW (ref 98–111)
Creatinine, Ser: 6.74 mg/dL — ABNORMAL HIGH (ref 0.61–1.24)
GFR, Estimated: 8 mL/min — ABNORMAL LOW (ref >60)
Glucose, Bld: 99 mg/dL (ref 70–99)
Potassium: 4.2 mmol/L (ref 3.5–5.1)
Sodium: 134 mmol/L — ABNORMAL LOW (ref 135–145)

## 2024-01-28 LAB — PHOSPHORUS: Phosphorus: 4.9 mg/dL — ABNORMAL HIGH (ref 2.5–4.6)

## 2024-01-28 MED ORDER — POLYETHYLENE GLYCOL 3350 17 G PO PACK
17.0000 g | PACK | Freq: Every day | ORAL | Status: AC
  Filled 2024-01-28: qty 1

## 2024-01-28 NOTE — Progress Notes (Signed)
 [facility] PROGRESS NOTE    Progress Note  Phillip [name]  FMW:[id number] DOB: 1956-04-19 DOA: 01/14/2024 PCP: [name] LABOR., MD     Brief Narrative:   Phillip [name] is an 68 y.o. male past medical history of end-stage renal disease on hemodialysis Tuesday Thursdays and Saturdays, diabetes mellitus type 2, recent acute cholecystitis in June 2025 requiring colostomy tube placement, chronic HFrEF with last EF of 30% in 2025, aortic stenosis status post TAVR 8/19/ 2025, PAD status post PCI to the left tibia-fibula on 12/26/2023 admitted by vascular surgery on 01/14/2024 for elective left third toe amputation due to gangrenous left third toe underwent angiogram of the left lower extremity on 12/26/2023 at [facility], however due to underlying comorbidities he was transferred to [facility]  Significant Events: 8/13 - left 3rd toe amputation 8/15 - left TMA and wound vac placment 8/19 - TAVR 8/22 - wound vac removed   Assessment/Plan:   Gangrene of toe of left foot (HCC) Status post transmetatarsal amputation with wound VAC placement on 01/16/2024. Wound VAC removed on 01/23/2024. Fortunately wound continues to be dusky and necrotic and vascular surgery recommended amputation BKA versus AKA on 01/30/2024  Severe aortic stenosis: Cardiology was consulted, status post TAVR's on 01/20/2024.  Chronic HFpEF:  Appears euvolemic continue volume per hemodialysis.  End-stage renal disease: Nephrology on board continue to dialyze Tuesday Thursdays and Saturdays.  CAD with CABG: Last cath on February 2025. Vascular surgery to dictate when to hold aspirin  and Plavix   pending surgery  Recent history of acute cholecystitis requiring cholecystectomy: Drain placed on 11/21/2023. In place has remained afebrile with no leukocytosis.  Controlled diabetes mellitus type 2: Continue sliding scale insulin , last A1c of 5.8.  Hypothyroidism: Continue Synthroid .  Goals of care: PT OT has  been consulted, being evaluated for inpatient rehab.   DVT prophylaxis: lovenox  Family Communication:none Status is: Inpatient Remains inpatient appropriate because: Below the knee amputation    Code Status:     Code Status Orders  (From admission, onward)           Start     Ordered   01/14/24 1727  Full code  Continuous       Question:  By:  Answer:  Consent: discussion documented in EHR   01/14/24 1727           Code Status History     Date Active Date Inactive Code Status Order ID Comments User Context   12/26/2023 1322 12/26/2023 2204 Full Code [id number]  Marea Selinda RAMAN, MD Inpatient   11/12/2023 0613 11/14/2023 2013 Full Code [id number]  Alfornia Madison, MD Inpatient   08/12/2023 1025 08/25/2023 1852 Full Code [id number]  Claudene Maximino LABOR, MD ED   08/01/2023 1314 08/01/2023 2034 Full Code [id number]  Wendel Lurena POUR, MD Inpatient   09/30/2022 1727 09/30/2022 2326 Full Code [id number]  Marea Selinda RAMAN, MD Inpatient   11/06/2020 2006 11/10/2020 1950 Full Code [id number]  Juvenal Harlene PENNER, DO Inpatient   02/18/2020 2326 02/22/2020 1919 Full Code [id number]  Tobie Jorie SAUNDERS, MD ED   09/22/2018 1831 09/26/2018 1649 Full Code [id number]  Jordis Laneta FALCON, MD Inpatient   09/08/2018 1838 09/21/2018 2107 Full Code [id number]  Jordis Laneta FALCON, MD Inpatient   03/13/2018 1152 03/13/2018 1730 Full Code [id number]  Ashley Soulier, [facility] Inpatient   03/02/2018 1033 03/02/2018 1539 Full Code [id number]  Marea Selinda RAMAN, MD Inpatient         IV Access:   Peripheral IV  Procedures and diagnostic studies:   No results found.   Medical Consultants:   None.   Subjective:    Phillip [name] pain is controlled had a bowel movement  Objective:    Vitals:   01/27/24 2300 01/27/24 2330 01/28/24 0000 01/28/24 0300  BP: 98/65   (!) 101/53  Pulse: 81   73  Resp: 12 (!) 22 11 10   Temp: 98.5 F (36.9 C)   98.9 F (37.2 C)  TempSrc: Oral   Oral  SpO2: 96%   100%  Weight:      Height:       SpO2:  100 % O2 Flow Rate (L/min): 0 L/min   Intake/Output Summary (Last 24 hours) at 01/28/2024 0858 Last data filed at 01/27/2024 1154 Gross per 24 hour  Intake --  Output 0 ml  Net 0 ml   Filed Weights   01/27/24 0314 01/27/24 0800 01/27/24 1226  Weight: 77.7 kg 73.1 kg 73.1 kg    Exam: General exam: In no acute distress. Respiratory system: Good air movement and clear to auscultation. Cardiovascular system: S1 & S2 heard, RRR. No JVD. Gastrointestinal system: Abdomen is nondistended, soft and nontender.  Extremities: Left lower extremity wrapped Skin: No rashes, lesions or ulcers Psychiatry: Judgement and insight appear normal. Mood & affect appropriate.    Data Reviewed:    Labs: Basic Metabolic Panel: Recent Labs  Lab 01/24/24 0353 01/25/24 0329 01/26/24 0328 01/27/24 0435 01/28/24 0311  NA 134* 135 134* 132* 134*  K 4.7 4.6 4.4 4.3 4.2  CL 92* 94* 94* 92* 93*  CO2 22 22 25 24 25   GLUCOSE 122* 97 145* 112* 99  BUN 78* 46* 64* 82* 42*  CREATININE 10.31* 7.03* 9.00* 10.35* 6.74*  CALCIUM  8.7* 8.8* 8.9 8.7* 8.6*  MG  --   --  2.3 2.3  --   PHOS  --   --   --   --  4.9*   GFR Estimated Creatinine Clearance: 10.8 mL/min (A) (by C-G formula based on SCr of 6.74 mg/dL (H)). Liver Function Tests: Recent Labs  Lab 01/26/24 0328  AST 17  ALT <5  ALKPHOS 96  BILITOT 0.8  PROT 8.4*  ALBUMIN  2.4*   No results for input(s): LIPASE, AMYLASE in the last 168 hours. No results for input(s): AMMONIA in the last 168 hours. Coagulation profile No results for input(s): INR, PROTIME in the last 168 hours. COVID-19 Labs  No results for input(s): DDIMER, FERRITIN, LDH, CRP in the last 72 hours.  Lab Results  Component Value Date   SARSCOV2NAA NEGATIVE 11/11/2023   SARSCOV2NAA NEGATIVE 11/06/2020   SARSCOV2NAA NEGATIVE 02/18/2020   SARSCOV2NAA NEGATIVE 10/20/2018    CBC: Recent Labs  Lab 01/24/24 0353 01/25/24 0329 01/26/24 0328 01/27/24 0435  01/28/24 0311  WBC 12.2* 13.8* 11.8* 11.0* 11.0*  HGB 10.8* 10.1* 9.8* 9.8* 9.6*  HCT 33.7* 31.8* 30.1* 30.3* 30.1*  MCV 93.9 94.6 94.1 93.8 95.6  PLT 178 188 170 159 148*   Cardiac Enzymes: No results for input(s): CKTOTAL, CKMB, CKMBINDEX, TROPONINI in the last 168 hours. BNP (last 3 results) Recent Labs    07/14/23 1412  PROBNP 11,269*   CBG: Recent Labs  Lab 01/26/24 2118 01/27/24 0613 01/27/24 1620 01/27/24 2106 01/28/24 0658  GLUCAP 92 126* 147* 90 95   D-Dimer: No results for input(s): DDIMER in the last 72 hours. Hgb A1c: No results for input(s): HGBA1C in the last 72 hours. Lipid Profile: No results for input(s):  CHOL, HDL, LDLCALC, TRIG, CHOLHDL, LDLDIRECT in the last 72 hours. Thyroid  function studies: No results for input(s): TSH, T4TOTAL, T3FREE, THYROIDAB in the last 72 hours.  Invalid input(s): FREET3 Anemia work up: No results for input(s): VITAMINB12, FOLATE, FERRITIN, TIBC, IRON , RETICCTPCT in the last 72 hours. Sepsis Labs: Recent Labs  Lab 01/25/24 0329 01/26/24 0328 01/27/24 0435 01/28/24 0311  WBC 13.8* 11.8* 11.0* 11.0*   Microbiology No results found for this or any previous visit (from the past 240 hours).   Medications:    (feeding supplement) PROSource Plus  30 mL Oral BID BM   aspirin  EC  81 mg Oral Daily   atorvastatin   20 mg Oral Daily   carvedilol   3.125 mg Oral BID   Chlorhexidine  Gluconate Cloth  6 each Topical Q0600   Chlorhexidine  Gluconate Cloth  6 each Topical Q0600   clopidogrel   75 mg Oral Daily   heparin   5,000 Units Subcutaneous Q8H   isosorbide  mononitrate  120 mg Oral Daily   levothyroxine   50 mcg Oral Daily   sevelamer  carbonate  800 mg Oral TID WC   sodium chloride  flush  3 mL Intravenous Q12H   tamsulosin   0.4 mg Oral QPC breakfast   Continuous Infusions:  sodium chloride      norepinephrine  (LEVOPHED ) Adult infusion        LOS: 14 days   Erle Odell Castor  [facility]  01/28/2024, 8:58 AM

## 2024-01-28 NOTE — Progress Notes (Signed)
 [facility] Progress Note   Subjective:  Seen in room. Completed dialysis yesterday with  no issues.  No complaints. Thinking about surgery    Objective Vitals:   01/27/24 2300 01/27/24 2330 01/28/24 0000 01/28/24 0300  BP: 98/65   (!) 101/53  Pulse: 81   73  Resp: 12 (!) 22 11 10   Temp: 98.5 F (36.9 C)   98.9 F (37.2 C)  TempSrc: Oral   Oral  SpO2: 96%   100%  Weight:      Height:       Physical Exam General: Alert, sitting up in bed, nad  Heart: RRR Lungs: Clear  Abdomen: soft, NTND Extremities: no LE edema, L TMA, necrosis at incision  Dialysis Access: LU AVF +b/t   Filed Weights   01/27/24 0314 01/27/24 0800 01/27/24 1226  Weight: 77.7 kg 73.1 kg 73.1 kg    Intake/Output Summary (Last 24 hours) at 01/28/2024 0914 Last data filed at 01/27/2024 1154 Gross per 24 hour  Intake --  Output 0 ml  Net 0 ml      Additional Objective Labs: Basic Metabolic Panel: Recent Labs  Lab 01/26/24 0328 01/27/24 0435 01/28/24 0311  NA 134* 132* 134*  K 4.4 4.3 4.2  CL 94* 92* 93*  CO2 25 24 25   GLUCOSE 145* 112* 99  BUN 64* 82* 42*  CREATININE 9.00* 10.35* 6.74*  CALCIUM  8.9 8.7* 8.6*  PHOS  --   --  4.9*   Liver Function Tests: Recent Labs  Lab 01/26/24 0328  AST 17  ALT <5  ALKPHOS 96  BILITOT 0.8  PROT 8.4*  ALBUMIN  2.4*   CBC: Recent Labs  Lab 01/24/24 0353 01/25/24 0329 01/26/24 0328 01/27/24 0435 01/28/24 0311  WBC 12.2* 13.8* 11.8* 11.0* 11.0*  HGB 10.8* 10.1* 9.8* 9.8* 9.6*  HCT 33.7* 31.8* 30.1* 30.3* 30.1*  MCV 93.9 94.6 94.1 93.8 95.6  PLT 178 188 170 159 148*   CBG: Recent Labs  Lab 01/26/24 2118 01/27/24 0613 01/27/24 1620 01/27/24 2106 01/28/24 0658  GLUCAP 92 126* 147* 90 95    Medications:  sodium chloride      norepinephrine  (LEVOPHED ) Adult infusion      (feeding supplement) PROSource Plus  30 mL Oral BID BM   aspirin  EC  81 mg Oral Daily   atorvastatin   20 mg Oral Daily   carvedilol   3.125 mg  Oral BID   Chlorhexidine  Gluconate Cloth  6 each Topical Q0600   Chlorhexidine  Gluconate Cloth  6 each Topical Q0600   clopidogrel   75 mg Oral Daily   heparin   5,000 Units Subcutaneous Q8H   isosorbide  mononitrate  120 mg Oral Daily   levothyroxine   50 mcg Oral Daily   polyethylene glycol  17 g Oral Daily   sevelamer  carbonate  800 mg Oral TID WC   sodium chloride  flush  3 mL Intravenous Q12H   tamsulosin   0.4 mg Oral QPC breakfast    Dialysis Orders: [facility] HD TTS - No heparin  - Left AVF (in use for 2 weeks) - EDW 81 kg - will need EDW lowered at d/c 4 hr 350/800 2K/2.5 Ca Bicarb 39 Mircera: 50 mcg ordered - but has not received any ESA yet No VDRA Renvela  800 mg TID WC   Assessment/Plan: PAD/ Left third toe gangrene: 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 Then TMA completed on 01/17/24 by Dr. Serene.  Plans per [facility].  Severe Aortic Stenosis: s/p TAVR 01/20/24 Cardiology following. Zio monitor  in place.  ESRD:  Usual HD TTS. Next HD 8/28 Hypertension/volume: BP in goal. On carvedilol  3.125mg  BID - hold in AM on HD days. Well below EDW now. Minimal UF with HD.  Chronic HFrEF: Appears euvolemic.   Recent acute cholecystitis requiring cholecystostomy drain 6/25: drain is in place.  Anemia: Hgb 10-11.  No indication for ESA at this time.  Metabolic bone disease: restarted home Sevelamer  800 TID WC.  SABRA Not on VDRA at his OP clinic. Phos in range.  DMT2: Insulin  per primary.  Nutrition:  Albumin  low. Cont prot supp. On renal diet with fluid restriction  Maisie Ronnald Acosta PA-C [facility] 01/28/2024,9:14 AM

## 2024-01-28 NOTE — Progress Notes (Signed)
  Progress Note    01/28/2024 8:05 AM 8 Days Post-Op  Subjective: L TMA pain overnight   Vitals:   01/28/24 0000 01/28/24 0300  BP:  (!) 101/53  Pulse:  73  Resp: 11 10  Temp:  98.9 F (37.2 C)  SpO2:  100%   Physical Exam: Lungs:  non labored Incisions:  L TMA necrotic Neurologic: A&O  CBC    Component Value Date/Time   WBC 11.0 (H) 01/28/2024 0311   RBC 3.15 (L) 01/28/2024 0311   HGB 9.6 (L) 01/28/2024 0311   HGB 11.5 (L) 05/16/2023 1429   HCT 30.1 (L) 01/28/2024 0311   HCT 35.3 (L) 05/16/2023 1429   PLT 148 (L) 01/28/2024 0311   PLT 144 (L) 05/16/2023 1429   MCV 95.6 01/28/2024 0311   MCV 90 05/16/2023 1429   MCH 30.5 01/28/2024 0311   MCHC 31.9 01/28/2024 0311   RDW 15.2 01/28/2024 0311   RDW 13.5 05/16/2023 1429   LYMPHSABS 0.7 11/11/2023 1930   LYMPHSABS 0.7 05/16/2023 1429   MONOABS 0.8 11/11/2023 1930   EOSABS 0.0 11/11/2023 1930   EOSABS 0.1 05/16/2023 1429   BASOSABS 0.0 11/11/2023 1930   BASOSABS 0.0 05/16/2023 1429    BMET    Component Value Date/Time   NA 134 (L) 01/28/2024 0311   NA 145 (H) 07/14/2023 1412   NA 136 08/30/2013 1025   K 4.2 01/28/2024 0311   K 3.7 08/30/2013 1025   CL 93 (L) 01/28/2024 0311   CL 101 08/30/2013 1025   CO2 25 01/28/2024 0311   CO2 31 08/30/2013 1025   GLUCOSE 99 01/28/2024 0311   GLUCOSE 310 (H) 08/30/2013 1025   BUN 42 (H) 01/28/2024 0311   BUN 97 (HH) 07/14/2023 1412   BUN 17 08/30/2013 1025   CREATININE 6.74 (H) 01/28/2024 0311   CREATININE 1.15 08/30/2013 1025   CALCIUM  8.6 (L) 01/28/2024 0311   CALCIUM  9.4 08/30/2013 1025   GFRNONAA 8 (L) 01/28/2024 0311   GFRNONAA >60 08/30/2013 1025   GFRAA 27 (L) 02/22/2020 0539   GFRAA >60 08/30/2013 1025    INR    Component Value Date/Time   INR 1.1 01/19/2024 1844     Intake/Output Summary (Last 24 hours) at 01/28/2024 0805 Last data filed at 01/27/2024 1154 Gross per 24 hour  Intake --  Output 0 ml  Net 0 ml     Assessment/Plan:  68 y.o.  male is s/p L TMA 8 Days Post-Op   Necrotic L TMA with rest pain.  He is tentatively scheduled for L BKA vs AKA on Friday with Dr. Serene.  Continue prn pain medication.  Dressing changes as needed.   Donnice Sender, PA-C [facility] [phone] 01/28/2024 8:05 AM

## 2024-01-28 NOTE — Plan of Care (Signed)
  Problem: Clinical Measurements: Goal: Ability to maintain clinical measurements within normal limits will improve Outcome: Progressing Goal: Will remain free from infection Outcome: Progressing Goal: Respiratory complications will improve Outcome: Progressing Goal: Cardiovascular complication will be avoided Outcome: Progressing   Problem: Activity: Goal: Risk for activity intolerance will decrease Outcome: Progressing   Problem: Nutrition: Goal: Adequate nutrition will be maintained Outcome: Progressing   Problem: Coping: Goal: Level of anxiety will decrease Outcome: Progressing

## 2024-01-28 NOTE — Progress Notes (Signed)
   01/28/24 1930  Spiritual Encounters  Type of Visit Initial  Care provided to: Patient  Referral source Clinical staff  Reason for visit Routine spiritual support  OnCall Visit Yes  Interventions  Spiritual Care Interventions Made Established relationship of care and support;Compassionate presence;Reflective listening;Normalization of emotions;Prayer;Encouragement  Intervention Outcomes  Outcomes Connection to spiritual care;Awareness around self/spiritual resourses;Awareness of health;Awareness of support;Reduced anxiety;Reduced isolation  Spiritual Care Plan  Spiritual Care Issues Still Outstanding No further spiritual care needs at this time (see row info)   Chaplain responded to page to visit patient with support prior to BKA.  Chaplain provided support through compassionate conversation, presence and prayer.   Chaplain spiritual support services remain available as the need arises.

## 2024-01-28 NOTE — Progress Notes (Signed)
 Physical Therapy Treatment Patient Details Name: Phillip [name] MRN: [id number] DOB: 1955-10-10 Today's Date: 01/28/2024   History of Present Illness Phillip [name] is a 68 y.o. male admitted 01/14/24 for left third toe gangrene. Pt s/p left third toe amputation including met head 8/13 and left TMA with wound vac application 8/15. Currently s/p TAVR on 8/19. PMHx: T1DM, PAD s/p angioplasty/PCI to left tibioperoneal trunk on 7/25, ESRD on HD TTS, HTN, chronic HFrEF, severe aortic stenosis, acute cholecystitis requiring cholecystostomy drain 6/25    PT Comments  Pt is progressing towards goals. Currently pt is Mod I for bed mobility, Min A for sit to stand and Min to Mod A for short non-functional distance gait. Pt requires upto Mod A navigating AD with occasional verbal cues for sequencing with gait. Due to pt current functional status, home set up and available assistance at home recommending skilled physical therapy services > 3 hours/day in order to address strength, balance and functional mobility to decrease risk for falls, injury, immobility, skin break down and re-hospitalization.      If plan is discharge home, recommend the following: A lot of help with walking and/or transfers;Assistance with cooking/housework;Assist for transportation;Help with stairs or ramp for entrance;A lot of help with bathing/dressing/bathroom   Can travel by private vehicle     No  Equipment Recommendations  None recommended by PT       Precautions / Restrictions Precautions Precautions: Fall Recall of Precautions/Restrictions: Intact Precaution/Restrictions Comments: R abdominal drain Required Braces or Orthoses: Other Brace Other Brace: Darco wedge shoe LLE Restrictions Weight Bearing Restrictions Per Provider Order: Yes Other Position/Activity Restrictions: WB through heel in darco shoe     Mobility  Bed Mobility Overal bed mobility: Modified Independent Bed Mobility: Supine to Sit      Supine to sit: HOB elevated, Used rails, Modified independent (Device/Increase time)     General bed mobility comments: sitting EOB at end of session    Transfers Overall transfer level: Needs assistance Equipment used: Rolling walker (2 wheels) Transfers: Sit to/from Stand Sit to Stand: Min assist           General transfer comment: Min A for 2x sit to stand from EOB with verbal cues for safe hand placement and body mechanics. Increased time to get fully to upright.    Ambulation/Gait Ambulation/Gait assistance: Min assist, Mod assist Gait Distance (Feet): 14 Feet Assistive device: Rolling walker (2 wheels) Gait Pattern/deviations: Step-to pattern, Decreased step length - left, Decreased stance time - left, Decreased weight shift to left, Decreased step length - right, Decreased stride length, Trunk flexed, Leaning posteriorly Gait velocity: decreased Gait velocity interpretation: <1.31 ft/sec, indicative of household ambulator   General Gait Details: Educated on sequencing with min-Mod  assist for walker placement and intermittent assist for posterior instability. Continues to fatigue quickly     Balance Overall balance assessment: Needs assistance Sitting-balance support: No upper extremity supported, Feet supported Sitting balance-Leahy Scale: Good Sitting balance - Comments: Pt sat EOB without assist.   Standing balance support: Bilateral upper extremity supported, During functional activity, Reliant on assistive device for balance Standing balance-Leahy Scale: Poor Standing balance comment: Mod UE support.      Communication Communication Communication: No apparent difficulties  Cognition Arousal: Alert Behavior During Therapy: WFL for tasks assessed/performed   PT - Cognitive impairments: No apparent impairments     Following commands: Intact      Cueing Cueing Techniques: Verbal cues     General Comments General  comments (skin integrity, edema, etc.):  O2 sats not reading well. HR remained stable.      Pertinent Vitals/Pain Pain Assessment Pain Assessment: 0-10 Pain Score: 7  Pain Location: L foot Pain Descriptors / Indicators: Aching, Operative site guarding Pain Intervention(s): Limited activity within patient's tolerance, Patient requesting pain meds-RN notified, Monitored during session           PT Goals (current goals can now be found in the care plan section) Acute Rehab PT Goals Patient Stated Goal: Recover well to be able to return Home PT Goal Formulation: With patient Time For Goal Achievement: 02/04/24 Potential to Achieve Goals: Good Progress towards PT goals: Progressing toward goals    Frequency    Min 3X/week      PT Plan  Continue with current POC        AM-PAC PT 6 Clicks Mobility   Outcome Measure  Help needed turning from your back to your side while in a flat bed without using bedrails?: None Help needed moving from lying on your back to sitting on the side of a flat bed without using bedrails?: None Help needed moving to and from a bed to a chair (including a wheelchair)?: A Little Help needed standing up from a chair using your arms (e.g., wheelchair or bedside chair)?: A Little Help needed to walk in hospital room?: A Lot Help needed climbing 3-5 steps with a railing? : Total 6 Click Score: 17    End of Session Equipment Utilized During Treatment: Gait belt Activity Tolerance: Patient tolerated treatment well;Patient limited by fatigue Patient left: with call bell/phone within reach;in bed;with bed alarm set Nurse Communication: Mobility status PT Visit Diagnosis: Muscle weakness (generalized) (M62.81);Difficulty in walking, not elsewhere classified (R26.2);Unsteadiness on feet (R26.81);Other abnormalities of gait and mobility (R26.89);Pain Pain - Right/Left: Left Pain - part of body: Ankle and joints of foot     Time: 8466-8443 PT Time Calculation (min) (ACUTE ONLY): 23  min  Charges:    $Therapeutic Activity: 23-37 mins PT General Charges $$ ACUTE PT VISIT: 1 Visit                    Dorothyann Maier, DPT, CLT  Acute Rehabilitation Services Office: [phone] (Secure chat preferred)    Dorothyann VEAR Maier 01/28/2024, 4:32 PM

## 2024-01-28 NOTE — Progress Notes (Signed)
 Occupational Therapy Treatment Patient Details Name: Phillip [name] MRN: [id number] DOB: [birth year] Today's Date: 01/28/2024   History of present illness Phillip [name] is a 68 y.o. male admitted 01/14/24 for left third toe gangrene. Pt s/p left third toe amputation including met head 8/13 and left TMA with wound vac application 8/15. Currently s/p TAVR on 8/19. PMHx: T1DM, PAD s/p angioplasty/PCI to left tibioperoneal trunk on 7/25, ESRD on HD TTS, HTN, chronic HFrEF, severe aortic stenosis, acute cholecystitis requiring cholecystostomy drain 6/25   OT comments  Pt progressing well towards goals. Pt with improved flexibility and standing balance for LB ADLs. Pt continues to be limited with decreased BUE strength for STS. x5 STS completed with heavy cues for hand placement and sequencing progressing from max to mod assist during session. Pt anxious about impending surgery, but eager to continue to make improvements during future therapy session. Continue to recommend >3 hours of skilled rehab daily to optimize independence levels. Will continue to follow acutely.       If plan is discharge home, recommend the following:  A lot of help with walking and/or transfers;A lot of help with bathing/dressing/bathroom   Equipment Recommendations  Other (comment) (Defer to next venue)    Recommendations for Other Services Rehab consult    Precautions / Restrictions Precautions Precautions: Fall Recall of Precautions/Restrictions: Intact Precaution/Restrictions Comments: R abdominal drain Required Braces or Orthoses: Other Brace Other Brace: Darco wedge shoe LLE Restrictions Weight Bearing Restrictions Per Provider Order: Yes Other Position/Activity Restrictions: WB through heel in darco shoe       Mobility Bed Mobility Overal bed mobility: Needs Assistance             General bed mobility comments: Received in recliner    Transfers Overall transfer level: Needs  assistance Equipment used: Rolling walker (2 wheels) Transfers: Sit to/from Stand Sit to Stand: Mod assist           General transfer comment: x5 STS completed with cues for hand placement and maintaining WB     Balance Overall balance assessment: Needs assistance Sitting-balance support: No upper extremity supported, Feet supported Sitting balance-Leahy Scale: Good     Standing balance support: Bilateral upper extremity supported, During functional activity, Reliant on assistive device for balance Standing balance-Leahy Scale: Poor Standing balance comment: Able to briefly have single UE support       ADL either performed or assessed with clinical judgement   ADL Overall ADL's : Needs assistance/impaired     Grooming: Wash/dry face;Oral care;Set up;Sitting       Lower Body Dressing: Minimal assistance;Sit to/from stand Lower Body Dressing Details (indicate cue type and reason): Able to don socks and post op shoe, min assist to steady in standing to pull above waist     Toileting- Clothing Manipulation and Hygiene: Minimal assistance;Sit to/from stand Toileting - Clothing Manipulation Details (indicate cue type and reason): Assist to steady in standing     Functional mobility during ADLs: Moderate assistance;Rolling walker (2 wheels) General ADL Comments: Began balance training with ADLs in prep for LLE amputation    Extremity/Trunk Assessment Upper Extremity Assessment Upper Extremity Assessment: Generalized weakness   Lower Extremity Assessment Lower Extremity Assessment: Defer to PT evaluation        Vision   Vision Assessment?: No apparent visual deficits         Communication Communication Communication: No apparent difficulties   Cognition Arousal: Alert Behavior During Therapy: WFL for tasks assessed/performed Cognition: Cognition impaired  Awareness: Online awareness impaired Memory impairment (select all impairments): Non-declarative  long-term memory, Declarative long-term memory   Executive functioning impairment (select all impairments): Problem solving, Sequencing OT - Cognition Comments: Delayed processing speed; cues for initiation of tasks and problem solving in light of current physical ability       Following commands: Intact        Cueing   Cueing Techniques: Verbal cues        General Comments VSS on RA    Pertinent Vitals/ Pain       Pain Assessment Pain Assessment: Faces Faces Pain Scale: Hurts even more Pain Location: LLE Pain Descriptors / Indicators: Aching, Operative site guarding Pain Intervention(s): Limited activity within patient's tolerance   Frequency  Min 2X/week        Progress Toward Goals  OT Goals(current goals can now be found in the care plan section)  Progress towards OT goals: Progressing toward goals  Acute Rehab OT Goals Patient Stated Goal: To get better OT Goal Formulation: With patient Time For Goal Achievement: 02/05/24 Potential to Achieve Goals: Good ADL Goals Pt Will Perform Lower Body Dressing: with contact guard assist;sit to/from stand Pt Will Transfer to Toilet: with contact guard assist;ambulating;regular height toilet Pt Will Perform Toileting - Clothing Manipulation and hygiene: with contact guard assist;sit to/from stand;sitting/lateral leans  Plan         AM-PAC OT 6 Clicks Daily Activity     Outcome Measure   Help from another person eating meals?: None Help from another person taking care of personal grooming?: A Little Help from another person toileting, which includes using toliet, bedpan, or urinal?: A Lot Help from another person bathing (including washing, rinsing, drying)?: A Little Help from another person to put on and taking off regular upper body clothing?: A Little Help from another person to put on and taking off regular lower body clothing?: A Little 6 Click Score: 18    End of Session Equipment Utilized During  Treatment: Gait belt;Rolling walker (2 wheels)  OT Visit Diagnosis: Unsteadiness on feet (R26.81);Other abnormalities of gait and mobility (R26.89);Muscle weakness (generalized) (M62.81)   Activity Tolerance Patient tolerated treatment well;Patient limited by pain   Patient Left in chair;with call bell/phone within reach;with chair alarm set   Nurse Communication Mobility status        Time: 9050-8984 OT Time Calculation (min): 26 min  Charges: OT General Charges $OT Visit: 1 Visit OT Treatments $Self Care/Home Management : 23-37 mins  Adrianne BROCKS, OT  Acute Rehabilitation Services Office [phone] Secure chat preferred   Adrianne GORMAN Savers 01/28/2024, 11:23 AM

## 2024-01-29 DIAGNOSIS — I96 Gangrene, not elsewhere classified: Secondary | ICD-10-CM | POA: Diagnosis not present

## 2024-01-29 LAB — GLUCOSE, CAPILLARY
Glucose-Capillary: 137 mg/dL — ABNORMAL HIGH (ref 70–99)
Glucose-Capillary: 136 mg/dL — ABNORMAL HIGH (ref 70–99)
Glucose-Capillary: 94 mg/dL (ref 70–99)

## 2024-01-29 MED ORDER — PENTAFLUOROPROP-TETRAFLUOROETH EX AERO: 1.0000 | INHALATION_SPRAY | CUTANEOUS | Status: DC | PRN

## 2024-01-29 MED ORDER — HYDROMORPHONE HCL 1 MG/ML IJ SOLN
INTRAMUSCULAR | Status: AC
  Filled 2024-01-29: qty 0.5

## 2024-01-29 MED ORDER — LIDOCAINE-PRILOCAINE 2.5-2.5 % EX CREA: 1.0000 | TOPICAL_CREAM | CUTANEOUS | Status: DC | PRN

## 2024-01-29 MED ORDER — LIDOCAINE HCL (PF) 1 % IJ SOLN
5.0000 mL | INTRAMUSCULAR | Status: DC | PRN
Start: 2024-01-29 — End: 2024-01-29

## 2024-01-29 MED ORDER — ALTEPLASE 2 MG IJ SOLR: 2.0000 mg | Freq: Once | INTRAMUSCULAR | Status: DC | PRN

## 2024-01-29 MED ORDER — HEPARIN SODIUM (PORCINE) 1000 UNIT/ML DIALYSIS: 1000.0000 [IU] | INTRAMUSCULAR | Status: DC | PRN

## 2024-01-29 NOTE — Progress Notes (Addendum)
  Progress Note    01/29/2024 7:24 AM 9 Days Post-Op  Subjective:  painful L TMA   Vitals:   01/29/24 0023 01/29/24 0250  BP: 104/70 113/80  Pulse:  88  Resp: 18 19  Temp:  98.5 F (36.9 C)  SpO2:     Physical Exam: Lungs:  non labored Incisions:  L TMA dressing left in place Neurologic: A&O  CBC    Component Value Date/Time   WBC 11.0 (H) 01/28/2024 0311   RBC 3.15 (L) 01/28/2024 0311   HGB 9.6 (L) 01/28/2024 0311   HGB 11.5 (L) 05/16/2023 1429   HCT 30.1 (L) 01/28/2024 0311   HCT 35.3 (L) 05/16/2023 1429   PLT 148 (L) 01/28/2024 0311   PLT 144 (L) 05/16/2023 1429   MCV 95.6 01/28/2024 0311   MCV 90 05/16/2023 1429   MCH 30.5 01/28/2024 0311   MCHC 31.9 01/28/2024 0311   RDW 15.2 01/28/2024 0311   RDW 13.5 05/16/2023 1429   LYMPHSABS 0.7 11/11/2023 1930   LYMPHSABS 0.7 05/16/2023 1429   MONOABS 0.8 11/11/2023 1930   EOSABS 0.0 11/11/2023 1930   EOSABS 0.1 05/16/2023 1429   BASOSABS 0.0 11/11/2023 1930   BASOSABS 0.0 05/16/2023 1429    BMET    Component Value Date/Time   NA 134 (L) 01/28/2024 0311   NA 145 (H) 07/14/2023 1412   NA 136 08/30/2013 1025   K 4.2 01/28/2024 0311   K 3.7 08/30/2013 1025   CL 93 (L) 01/28/2024 0311   CL 101 08/30/2013 1025   CO2 25 01/28/2024 0311   CO2 31 08/30/2013 1025   GLUCOSE 99 01/28/2024 0311   GLUCOSE 310 (H) 08/30/2013 1025   BUN 42 (H) 01/28/2024 0311   BUN 97 (HH) 07/14/2023 1412   BUN 17 08/30/2013 1025   CREATININE 6.74 (H) 01/28/2024 0311   CREATININE 1.15 08/30/2013 1025   CALCIUM  8.6 (L) 01/28/2024 0311   CALCIUM  9.4 08/30/2013 1025   GFRNONAA 8 (L) 01/28/2024 0311   GFRNONAA >60 08/30/2013 1025   GFRAA 27 (L) 02/22/2020 0539   GFRAA >60 08/30/2013 1025    INR    Component Value Date/Time   INR 1.1 01/19/2024 1844    No intake or output data in the 24 hours ending 01/29/24 0724   Assessment/Plan:  68 y.o. male is s/p L TMA 9 Days Post-Op   L TMA is frankly necrotic.  Over the past  several days we have discussed leg amputation.  He is adamant about keeping L knee.  He understands the risk of non healing at this level of amputation.  Plan is for L BKA tomorrow with Dr. Serene.  NPO past midnight.  Consent ordered.   Donnice Sender, PA-C [facility] [phone] 01/29/2024 7:24 AM   Considering proceeding with amputation tomorrow.  Deliberating on AKA versus BKA.  He understands the risk of wound issues with a below-knee amputation which potentially could necessitate an above-knee amputation if it does not heal.  Malvina Serene

## 2024-01-29 NOTE — Progress Notes (Signed)
 Received patient in bed.Alert and oriented x4. Consent verified.  Access used:Right arm avf that worked well.  Duration of treatment: 3.25 hours.  Net uf : Met 1 liter,hardly tolerated due to decreasing sbp near the end of treatment.  Medicine given: Hydromorphone  0.5 mg.  Hand off to the patient's nurse,back into his room with stable condition via transporter.

## 2024-01-29 NOTE — Progress Notes (Addendum)
 [facility] Progress Note    Dialysis Orders: [place] HD TTS - No heparin  - Left AVF (in use for 2 weeks) - EDW 81 kg - will need EDW lowered at d/c 4 hr 350/800 2K/2.5 Ca Bicarb 39 Mircera: 50 mcg ordered - but has not received any ESA yet No VDRA Renvela  800 mg TID WC   Assessment/Plan: PAD/ Left third toe gangrene: 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 Then TMA completed on 01/17/24 by Dr. Serene.  Plans per [facility] -> left TMA necrotic, and will need left BKA which is planned for Friday.  Vascular has concerns about wound healing at that level but the patient is trying to keep the knee..  Severe Aortic Stenosis: s/p TAVR 01/20/24 Cardiology following. Zio monitor in place.  ESRD:  Usual HD TTS.  About to start HD 8/28 Hypertension/volume: BP in goal. On carvedilol  3.125mg  BID - hold in AM on HD days. Well below EDW now. Minimal UF with HD.  Chronic HFrEF: Appears euvolemic.   Recent acute cholecystitis requiring cholecystostomy drain 6/25: drain is in place.  Anemia: Hgb 9.6 on 8/27. No indication for ESA at this time.  Metabolic bone disease: restarted home Sevelamer  800 TID WC.  SABRA Not on VDRA at his OP clinic. Phos in range.  DMT2: Insulin  per primary.  Nutrition:  Albumin  low. Cont prot supp. On renal diet with fluid restriction  Subjective:  Seen in dialysis and about to be cannulated.  Still has some pain at the surgical site but denies any fever, chills, nausea, vomiting.  Appetite is decent.   Objective Vitals:   01/29/24 0023 01/29/24 0250 01/29/24 0800 01/29/24 0828  BP: 104/70 113/80 109/67   Pulse:  88  (P) 74  Resp: 18 19 (!) 22 (P) 14  Temp:  98.5 F (36.9 C)  (!) (P) 97.5 F (36.4 C)  TempSrc:  Oral    SpO2:      Weight:    (P) 73.5 kg  Height:       Physical Exam General: Alert, in bed at a 30 degree angle, comfortable, nad  Heart: RRR Lungs: Clear  Abdomen: soft, NTND Extremities: no LE edema, L TMA, necrosis at incision   Dialysis Access: LU AVF +b/t   Filed Weights   01/27/24 0800 01/27/24 1226 01/29/24 0828  Weight: 73.1 kg 73.1 kg (P) 73.5 kg   No intake or output data in the 24 hours ending 01/29/24 0834     Additional Objective Labs: Basic Metabolic Panel: Recent Labs  Lab 01/26/24 0328 01/27/24 0435 01/28/24 0311  NA 134* 132* 134*  K 4.4 4.3 4.2  CL 94* 92* 93*  CO2 25 24 25   GLUCOSE 145* 112* 99  BUN 64* 82* 42*  CREATININE 9.00* 10.35* 6.74*  CALCIUM  8.9 8.7* 8.6*  PHOS  --   --  4.9*   Liver Function Tests: Recent Labs  Lab 01/26/24 0328  AST 17  ALT <5  ALKPHOS 96  BILITOT 0.8  PROT 8.4*  ALBUMIN  2.4*   CBC: Recent Labs  Lab 01/24/24 0353 01/25/24 0329 01/26/24 0328 01/27/24 0435 01/28/24 0311  WBC 12.2* 13.8* 11.8* 11.0* 11.0*  HGB 10.8* 10.1* 9.8* 9.8* 9.6*  HCT 33.7* 31.8* 30.1* 30.3* 30.1*  MCV 93.9 94.6 94.1 93.8 95.6  PLT 178 188 170 159 148*   CBG: Recent Labs  Lab 01/28/24 0658 01/28/24 1148 01/28/24 1719 01/28/24 2144 01/29/24 0602  GLUCAP 95 163* 120* 147* 137*  Medications:  sodium chloride      norepinephrine  (LEVOPHED ) Adult infusion      (feeding supplement) PROSource Plus  30 mL Oral BID BM   aspirin  EC  81 mg Oral Daily   atorvastatin   20 mg Oral Daily   carvedilol   3.125 mg Oral BID   Chlorhexidine  Gluconate Cloth  6 each Topical Q0600   clopidogrel   75 mg Oral Daily   heparin   5,000 Units Subcutaneous Q8H   isosorbide  mononitrate  120 mg Oral Daily   levothyroxine   50 mcg Oral Daily   polyethylene glycol  17 g Oral Daily   sevelamer  carbonate  800 mg Oral TID WC   sodium chloride  flush  3 mL Intravenous Q12H   tamsulosin   0.4 mg Oral QPC breakfast

## 2024-01-29 NOTE — Plan of Care (Signed)

## 2024-01-29 NOTE — Progress Notes (Signed)
 [facility] PROGRESS NOTE    Progress Note  Phillip [name]  FMW:[id number] DOB: Jun 25, 1955 DOA: 01/14/2024 PCP: [name] LABOR., MD     Brief Narrative:   Phillip [name] is an 68 y.o. male past medical history of end-stage renal disease on hemodialysis Tuesday Thursdays and Saturdays, diabetes mellitus type 2, recent acute cholecystitis in June 2025 requiring colostomy tube placement, chronic HFrEF with last EF of 30% in 2025, aortic stenosis status post TAVR 8/19/ 2025, PAD status post PCI to the left tibia-fibula on 12/26/2023 admitted by vascular surgery on 01/14/2024 for elective left third toe amputation due to gangrenous left third toe underwent angiogram of the left lower extremity on 12/26/2023 at [facility], however due to underlying comorbidities he was transferred to [facility]  Significant Events: 8/13 - left 3rd toe amputation 8/15 - left TMA and wound vac placment 8/19 - TAVR 8/22 - wound vac removed   Assessment/Plan:   Gangrene of toe of left foot (HCC) Status post transmetatarsal amputation with wound VAC placement on 01/16/2024. Wound VAC removed on 01/23/2024. Vascular surgery discussed with patient for BKA on 01/30/2024.  Severe aortic stenosis: Cardiology was consulted, status post TAVR's on 01/20/2024.  Chronic HFpEF:  Appears euvolemic continue volume per hemodialysis.  End-stage renal disease: Nephrology on board continue to dialyze Tuesday Thursdays and Saturdays.  CAD with CABG: Last cath on February 2025. Vascular surgery to dictate when to hold aspirin  and Plavix   pending surgery  Recent history of acute cholecystitis requiring cholecystectomy: Drain placed on 11/21/2023. In place has remained afebrile with no leukocytosis.  Controlled diabetes mellitus type 2: Continue sliding scale insulin , last A1c of 5.8.  Hypothyroidism: Continue Synthroid .  Goals of care: PT OT has been consulted, being evaluated for inpatient rehab.   DVT  prophylaxis: lovenox  Family Communication:none Status is: Inpatient Remains inpatient appropriate because: Below the knee amputation    Code Status:     Code Status Orders  (From admission, onward)           Start     Ordered   01/14/24 1727  Full code  Continuous       Question:  By:  Answer:  Consent: discussion documented in EHR   01/14/24 1727           Code Status History     Date Active Date Inactive Code Status Order ID Comments User Context   12/26/2023 1322 12/26/2023 2204 Full Code [id number]  Marea Selinda RAMAN, MD Inpatient   11/12/2023 0613 11/14/2023 2013 Full Code [id number]  Alfornia Madison, MD Inpatient   08/12/2023 1025 08/25/2023 1852 Full Code [id number]  Claudene Maximino LABOR, MD ED   08/01/2023 1314 08/01/2023 2034 Full Code [id number]  Wendel Lurena POUR, MD Inpatient   09/30/2022 1727 09/30/2022 2326 Full Code [id number]  Marea Selinda RAMAN, MD Inpatient   11/06/2020 2006 11/10/2020 1950 Full Code [id number]  Juvenal Harlene PENNER, DO Inpatient   02/18/2020 2326 02/22/2020 1919 Full Code [id number]  Tobie Jorie SAUNDERS, MD ED   09/22/2018 1831 09/26/2018 1649 Full Code [id number]  Jordis Laneta FALCON, MD Inpatient   09/08/2018 1838 09/21/2018 2107 Full Code [id number]  Jordis Laneta FALCON, MD Inpatient   03/13/2018 1152 03/13/2018 1730 Full Code [id number]  Ashley Soulier, [facility] Inpatient   03/02/2018 1033 03/02/2018 1539 Full Code [id number]  Marea Selinda RAMAN, MD Inpatient         IV Access:   Peripheral IV   Procedures and diagnostic studies:   No  results found.   Medical Consultants:   None.   Subjective:    [name] pain is controlled.  Objective:    Vitals:   01/28/24 2152 01/28/24 2309 01/29/24 0023 01/29/24 0250  BP: (!) 105/56 (!) 99/45 104/70 113/80  Pulse:  74  88  Resp: 16 14 18 19   Temp:  98.6 F (37 C)  98.5 F (36.9 C)  TempSrc:  Oral  Oral  SpO2:      Weight:      Height:       SpO2: 100 % O2 Flow Rate (L/min): 0 L/min  No intake or output data in the 24  hours ending 01/29/24 0736  Filed Weights   01/27/24 0314 01/27/24 0800 01/27/24 1226  Weight: 77.7 kg 73.1 kg 73.1 kg    Exam: General exam: In no acute distress. Respiratory system: Good air movement and clear to auscultation. Cardiovascular system: S1 & S2 heard, RRR. No JVD. Gastrointestinal system: Abdomen is nondistended, soft and nontender.  Extremities: No pedal edema. Skin: No rashes, lesions or ulcers Psychiatry: Judgement and insight appear normal. Mood & affect appropriate.   Data Reviewed:    Labs: Basic Metabolic Panel: Recent Labs  Lab 01/24/24 0353 01/25/24 0329 01/26/24 0328 01/27/24 0435 01/28/24 0311  NA 134* 135 134* 132* 134*  K 4.7 4.6 4.4 4.3 4.2  CL 92* 94* 94* 92* 93*  CO2 22 22 25 24 25   GLUCOSE 122* 97 145* 112* 99  BUN 78* 46* 64* 82* 42*  CREATININE 10.31* 7.03* 9.00* 10.35* 6.74*  CALCIUM  8.7* 8.8* 8.9 8.7* 8.6*  MG  --   --  2.3 2.3  --   PHOS  --   --   --   --  4.9*   GFR Estimated Creatinine Clearance: 10.8 mL/min (A) (by C-G formula based on SCr of 6.74 mg/dL (H)). Liver Function Tests: Recent Labs  Lab 01/26/24 0328  AST 17  ALT <5  ALKPHOS 96  BILITOT 0.8  PROT 8.4*  ALBUMIN  2.4*   No results for input(s): LIPASE, AMYLASE in the last 168 hours. No results for input(s): AMMONIA in the last 168 hours. Coagulation profile No results for input(s): INR, PROTIME in the last 168 hours. COVID-19 Labs  No results for input(s): DDIMER, FERRITIN, LDH, CRP in the last 72 hours.  Lab Results  Component Value Date   SARSCOV2NAA NEGATIVE 11/11/2023   SARSCOV2NAA NEGATIVE 11/06/2020   SARSCOV2NAA NEGATIVE 02/18/2020   SARSCOV2NAA NEGATIVE 10/20/2018    CBC: Recent Labs  Lab 01/24/24 0353 01/25/24 0329 01/26/24 0328 01/27/24 0435 01/28/24 0311  WBC 12.2* 13.8* 11.8* 11.0* 11.0*  HGB 10.8* 10.1* 9.8* 9.8* 9.6*  HCT 33.7* 31.8* 30.1* 30.3* 30.1*  MCV 93.9 94.6 94.1 93.8 95.6  PLT 178 188 170 159 148*    Cardiac Enzymes: No results for input(s): CKTOTAL, CKMB, CKMBINDEX, TROPONINI in the last 168 hours. BNP (last 3 results) Recent Labs    07/14/23 1412  PROBNP 11,269*   CBG: Recent Labs  Lab 01/28/24 0658 01/28/24 1148 01/28/24 1719 01/28/24 2144 01/29/24 0602  GLUCAP 95 163* 120* 147* 137*   D-Dimer: No results for input(s): DDIMER in the last 72 hours. Hgb A1c: No results for input(s): HGBA1C in the last 72 hours. Lipid Profile: No results for input(s): CHOL, HDL, LDLCALC, TRIG, CHOLHDL, LDLDIRECT in the last 72 hours. Thyroid  function studies: No results for input(s): TSH, T4TOTAL, T3FREE, THYROIDAB in the last 72 hours.  Invalid input(s): FREET3 Anemia  work up: No results for input(s): VITAMINB12, FOLATE, FERRITIN, TIBC, IRON , RETICCTPCT in the last 72 hours. Sepsis Labs: Recent Labs  Lab 01/25/24 0329 01/26/24 0328 01/27/24 0435 01/28/24 0311  WBC 13.8* 11.8* 11.0* 11.0*   Microbiology No results found for this or any previous visit (from the past 240 hours).   Medications:    (feeding supplement) PROSource Plus  30 mL Oral BID BM   aspirin  EC  81 mg Oral Daily   atorvastatin   20 mg Oral Daily   carvedilol   3.125 mg Oral BID   Chlorhexidine  Gluconate Cloth  6 each Topical Q0600   clopidogrel   75 mg Oral Daily   heparin   5,000 Units Subcutaneous Q8H   isosorbide  mononitrate  120 mg Oral Daily   levothyroxine   50 mcg Oral Daily   polyethylene glycol  17 g Oral Daily   sevelamer  carbonate  800 mg Oral TID WC   sodium chloride  flush  3 mL Intravenous Q12H   tamsulosin   0.4 mg Oral QPC breakfast   Continuous Infusions:  sodium chloride      norepinephrine  (LEVOPHED ) Adult infusion        LOS: 15 days   Erle Odell Castor  [facility]  01/29/2024, 7:36 AM

## 2024-01-29 NOTE — Progress Notes (Signed)
 Pt returned from HD, very anxious to get into recliner.  Assisted to recliner and to use urinal.  VS taken once settled.  Will cont plan of care.

## 2024-01-30 ENCOUNTER — Inpatient Hospital Stay (HOSPITAL_COMMUNITY): Payer: Self-pay | Admitting: Certified Registered"

## 2024-01-30 ENCOUNTER — Other Ambulatory Visit: Payer: Self-pay

## 2024-01-30 ENCOUNTER — Encounter (HOSPITAL_COMMUNITY)
Admission: RE | Disposition: A | Discharge status: Rehabilitation Facility | Payer: Self-pay | Source: Home / Self Care | Attending: Internal Medicine

## 2024-01-30 ENCOUNTER — Encounter (HOSPITAL_COMMUNITY): Payer: Self-pay | Admitting: Internal Medicine

## 2024-01-30 DIAGNOSIS — I132 Hypertensive heart and chronic kidney disease with heart failure and with stage 5 chronic kidney disease, or end stage renal disease: Secondary | ICD-10-CM | POA: Diagnosis not present

## 2024-01-30 DIAGNOSIS — I5042 Chronic combined systolic (congestive) and diastolic (congestive) heart failure: Secondary | ICD-10-CM

## 2024-01-30 DIAGNOSIS — T8789 Other complications of amputation stump: Secondary | ICD-10-CM

## 2024-01-30 DIAGNOSIS — Z89432 Acquired absence of left foot: Secondary | ICD-10-CM

## 2024-01-30 DIAGNOSIS — N186 End stage renal disease: Secondary | ICD-10-CM | POA: Diagnosis not present

## 2024-01-30 DIAGNOSIS — I96 Gangrene, not elsewhere classified: Secondary | ICD-10-CM | POA: Diagnosis not present

## 2024-01-30 HISTORY — PX: AMPUTATION: SHX166

## 2024-01-30 LAB — POCT I-STAT, CHEM 8
BUN: 34 mg/dL — ABNORMAL HIGH (ref 8–23)
BUN: 52 mg/dL — ABNORMAL HIGH (ref 8–23)
Calcium, Ion: 0.98 mmol/L — ABNORMAL LOW (ref 1.15–1.40)
Calcium, Ion: 1.04 mmol/L — ABNORMAL LOW (ref 1.15–1.40)
Chloride: 94 mmol/L — ABNORMAL LOW (ref 98–111)
Chloride: 96 mmol/L — ABNORMAL LOW (ref 98–111)
Creatinine, Ser: 5.9 mg/dL — ABNORMAL HIGH (ref 0.61–1.24)
Creatinine, Ser: 6.1 mg/dL — ABNORMAL HIGH (ref 0.61–1.24)
Glucose, Bld: 114 mg/dL — ABNORMAL HIGH (ref 70–99)
Glucose, Bld: 115 mg/dL — ABNORMAL HIGH (ref 70–99)
HCT: 32 % — ABNORMAL LOW (ref 39.0–52.0)
HCT: 33 % — ABNORMAL LOW (ref 39.0–52.0)
Hemoglobin: 10.9 g/dL — ABNORMAL LOW (ref 13.0–17.0)
Hemoglobin: 11.2 g/dL — ABNORMAL LOW (ref 13.0–17.0)
Potassium: 4.3 mmol/L (ref 3.5–5.1)
Potassium: 8.3 mmol/L (ref 3.5–5.1)
Sodium: 130 mmol/L — ABNORMAL LOW (ref 135–145)
Sodium: 134 mmol/L — ABNORMAL LOW (ref 135–145)
TCO2: 27 mmol/L (ref 22–32)
TCO2: 30 mmol/L (ref 22–32)

## 2024-01-30 LAB — GLUCOSE, CAPILLARY
Glucose-Capillary: 109 mg/dL — ABNORMAL HIGH (ref 70–99)
Glucose-Capillary: 148 mg/dL — ABNORMAL HIGH (ref 70–99)
Glucose-Capillary: 158 mg/dL — ABNORMAL HIGH (ref 70–99)

## 2024-01-30 LAB — CBC
HCT: 32.1 % — ABNORMAL LOW (ref 39.0–52.0)
Hemoglobin: 9.8 g/dL — ABNORMAL LOW (ref 13.0–17.0)
MCH: 29.9 pg (ref 26.0–34.0)
MCHC: 30.5 g/dL (ref 30.0–36.0)
MCV: 97.9 fL (ref 80.0–100.0)
Platelets: 173 K/uL (ref 150–400)
RBC: 3.28 MIL/uL — ABNORMAL LOW (ref 4.22–5.81)
RDW: 15.2 % (ref 11.5–15.5)
WBC: 9.7 K/uL (ref 4.0–10.5)
nRBC: 0 % (ref 0.0–0.2)

## 2024-01-30 LAB — BASIC METABOLIC PANEL WITH GFR
Anion gap: 16 — ABNORMAL HIGH (ref 5–15)
BUN: 33 mg/dL — ABNORMAL HIGH (ref 8–23)
CO2: 27 mmol/L (ref 22–32)
Calcium: 9 mg/dL (ref 8.9–10.3)
Chloride: 92 mmol/L — ABNORMAL LOW (ref 98–111)
Creatinine, Ser: 6.2 mg/dL — ABNORMAL HIGH (ref 0.61–1.24)
GFR, Estimated: 9 mL/min — ABNORMAL LOW (ref >60)
Glucose, Bld: 111 mg/dL — ABNORMAL HIGH (ref 70–99)
Potassium: 4.3 mmol/L (ref 3.5–5.1)
Sodium: 135 mmol/L (ref 135–145)

## 2024-01-30 SURGERY — AMPUTATION BELOW KNEE
Anesthesia: Monitor Anesthesia Care | Site: Knee | Laterality: Left

## 2024-01-30 MED ORDER — ONDANSETRON HCL 4 MG/2ML IJ SOLN: 4.0000 mg | Freq: Four times a day (QID) | INTRAMUSCULAR | Status: DC | PRN

## 2024-01-30 MED ORDER — VASOPRESSIN 20 UNIT/ML IV SOLN
INTRAVENOUS | Status: AC
  Filled 2024-01-30: qty 1

## 2024-01-30 MED ORDER — BUPIVACAINE LIPOSOME 1.3 % IJ SUSP
INTRAMUSCULAR | Status: DC | PRN
  Administered 2024-01-30: 10 mL via PERINEURAL

## 2024-01-30 MED ORDER — FENTANYL CITRATE (PF) 250 MCG/5ML IJ SOLN
INTRAMUSCULAR | Status: AC
  Filled 2024-01-30: qty 5

## 2024-01-30 MED ORDER — PHENOL 1.4 % MT LIQD: 1.0000 | OROMUCOSAL | Status: DC | PRN

## 2024-01-30 MED ORDER — ALBUMIN HUMAN 5 % IV SOLN
INTRAVENOUS | Status: DC | PRN
Start: 2024-01-30 — End: 2024-01-30

## 2024-01-30 MED ORDER — PROPOFOL 10 MG/ML IV BOLUS
INTRAVENOUS | Status: DC | PRN
Start: 2024-01-30 — End: 2024-01-30
  Administered 2024-01-30: 40 mg via INTRAVENOUS

## 2024-01-30 MED ORDER — CHLORHEXIDINE GLUCONATE 0.12 % MT SOLN
15.0000 mL | Freq: Once | OROMUCOSAL | Status: AC
  Administered 2024-01-30: 15 mL via OROMUCOSAL

## 2024-01-30 MED ORDER — SODIUM CHLORIDE 0.9 % IV SOLN: INTRAVENOUS | Status: AC

## 2024-01-30 MED ORDER — VASOPRESSIN 20 UNIT/ML IV SOLN
INTRAVENOUS | Status: DC | PRN
  Administered 2024-01-30: 2 [IU] via INTRAVENOUS
  Administered 2024-01-30 (×5): 1 [IU] via INTRAVENOUS

## 2024-01-30 MED ORDER — OXYCODONE HCL 5 MG PO TABS
5.0000 mg | ORAL_TABLET | ORAL | Status: DC | PRN
  Administered 2024-01-30: 5 mg via ORAL
  Administered 2024-01-30 – 2024-01-31 (×5): 10 mg via ORAL
  Administered 2024-01-31: 5 mg via ORAL
  Administered 2024-02-01 – 2024-02-03 (×9): 10 mg via ORAL
  Filled 2024-01-30 (×16): qty 2

## 2024-01-30 MED ORDER — FENTANYL CITRATE (PF) 100 MCG/2ML IJ SOLN
INTRAMUSCULAR | Status: DC | PRN
  Administered 2024-01-30 (×2): 50 ug via INTRAVENOUS

## 2024-01-30 MED ORDER — PROPOFOL 500 MG/50ML IV EMUL
INTRAVENOUS | Status: DC | PRN
  Administered 2024-01-30: 25 ug/kg/min via INTRAVENOUS

## 2024-01-30 MED ORDER — DEXAMETHASONE SODIUM PHOSPHATE 10 MG/ML IJ SOLN
INTRAMUSCULAR | Status: DC | PRN
  Administered 2024-01-30: 10 mg via PERINEURAL

## 2024-01-30 MED ORDER — HYDROMORPHONE HCL 1 MG/ML IJ SOLN
0.5000 mg | INTRAMUSCULAR | Status: DC | PRN
  Administered 2024-01-30 – 2024-02-05 (×20): 0.5 mg via INTRAVENOUS
  Filled 2024-01-30 (×20): qty 0.5

## 2024-01-30 MED ORDER — ORAL CARE MOUTH RINSE: 15.0000 mL | Freq: Once | OROMUCOSAL | Status: AC

## 2024-01-30 MED ORDER — LACTATED RINGERS IV SOLN: INTRAVENOUS | Status: DC

## 2024-01-30 MED ORDER — BACITRACIN ZINC 500 UNIT/GM EX OINT
TOPICAL_OINTMENT | CUTANEOUS | Status: DC | PRN
  Administered 2024-01-30: 1 via TOPICAL

## 2024-01-30 MED ORDER — BUPIVACAINE HCL (PF) 0.5 % IJ SOLN
INTRAMUSCULAR | Status: DC | PRN
  Administered 2024-01-30 (×2): 20 mL via PERINEURAL

## 2024-01-30 MED ORDER — CEFAZOLIN SODIUM-DEXTROSE 2-4 GM/100ML-% IV SOLN
2.0000 g | Freq: Three times a day (TID) | INTRAVENOUS | Status: AC
  Administered 2024-01-30 – 2024-01-31 (×2): 2 g via INTRAVENOUS
  Filled 2024-01-30 (×2): qty 100

## 2024-01-30 MED ORDER — FENTANYL CITRATE (PF) 100 MCG/2ML IJ SOLN
INTRAMUSCULAR | Status: AC
  Filled 2024-01-30: qty 2

## 2024-01-30 MED ORDER — BACITRACIN ZINC 500 UNIT/GM EX OINT
TOPICAL_OINTMENT | CUTANEOUS | Status: AC
  Filled 2024-01-30: qty 28.35

## 2024-01-30 MED ORDER — CEFAZOLIN SODIUM-DEXTROSE 2-3 GM-%(50ML) IV SOLR
INTRAVENOUS | Status: DC | PRN
  Administered 2024-01-30: 2 g via INTRAVENOUS

## 2024-01-30 MED ORDER — PHENYLEPHRINE HCL-NACL 20-0.9 MG/250ML-% IV SOLN
INTRAVENOUS | Status: DC | PRN
  Administered 2024-01-30: 50 ug/min via INTRAVENOUS

## 2024-01-30 MED ORDER — 0.9 % SODIUM CHLORIDE (POUR BTL) OPTIME
TOPICAL | Status: DC | PRN
Start: 2024-01-30 — End: 2024-01-30
  Administered 2024-01-30: 1000 mL

## 2024-01-30 SURGICAL SUPPLY — 50 items
BAG COUNTER SPONGE SURGICOUNT (BAG) ×1 IMPLANT
BANDAGE ESMARK 6X9 LF (GAUZE/BANDAGES/DRESSINGS) IMPLANT
BLADE SAW GIGLI 510 (BLADE) ×1 IMPLANT
BNDG COHESIVE 6X5 TAN ST LF (GAUZE/BANDAGES/DRESSINGS) ×1 IMPLANT
BNDG ELASTIC 4X5.8 VLCR STR LF (GAUZE/BANDAGES/DRESSINGS) ×1 IMPLANT
BNDG ELASTIC 6INX 5YD STR LF (GAUZE/BANDAGES/DRESSINGS) ×1 IMPLANT
BNDG GAUZE DERMACEA FLUFF 4 (GAUZE/BANDAGES/DRESSINGS) IMPLANT
CANISTER SUCTION 3000ML PPV (SUCTIONS) ×1 IMPLANT
CLIP TI MEDIUM 6 (CLIP) IMPLANT
CNTNR URN SCR LID CUP LEK RST (MISCELLANEOUS) ×1 IMPLANT
COVER SURGICAL LIGHT HANDLE (MISCELLANEOUS) ×1 IMPLANT
CUFF TOURN SGL QUICK 42 (TOURNIQUET CUFF) IMPLANT
CUFF TRNQT CYL 34X4.125X (TOURNIQUET CUFF) IMPLANT
DRAIN CHANNEL 19F RND (DRAIN) IMPLANT
DRAPE DERMATAC (DRAPES) IMPLANT
DRAPE HALF SHEET 40X57 (DRAPES) ×1 IMPLANT
DRAPE INCISE IOBAN 66X45 STRL (DRAPES) IMPLANT
DRAPE SURG ORHT 6 SPLT 77X108 (DRAPES) ×2 IMPLANT
DRSG ADAPTIC 3X8 NADH LF (GAUZE/BANDAGES/DRESSINGS) ×1 IMPLANT
DRSG CURAD 3X16 NADH (PACKING) IMPLANT
ELECTRODE REM PT RTRN 9FT ADLT (ELECTROSURGICAL) ×1 IMPLANT
EVACUATOR SILICONE 100CC (DRAIN) IMPLANT
GAUZE 4X4 16PLY ~~LOC~~+RFID DBL (SPONGE) ×1 IMPLANT
GAUZE SPONGE 4X4 12PLY STRL (GAUZE/BANDAGES/DRESSINGS) ×1 IMPLANT
GAUZE SPONGE 4X4 12PLY STRL LF (GAUZE/BANDAGES/DRESSINGS) IMPLANT
GLOVE SURG SS PI 7.5 STRL IVOR (GLOVE) ×3 IMPLANT
GOWN STRL REUS W/ TWL LRG LVL3 (GOWN DISPOSABLE) ×2 IMPLANT
GOWN STRL REUS W/ TWL XL LVL3 (GOWN DISPOSABLE) ×1 IMPLANT
GRAFT SKIN WND MICRO 38 (Tissue) IMPLANT
KIT BASIN OR (CUSTOM PROCEDURE TRAY) ×1 IMPLANT
KIT TURNOVER KIT B (KITS) ×1 IMPLANT
NDL HYPO 25GX1X1/2 BEV (NEEDLE) ×1 IMPLANT
NEEDLE HYPO 25GX1X1/2 BEV (NEEDLE) ×1 IMPLANT
NS IRRIG 1000ML POUR BTL (IV SOLUTION) ×1 IMPLANT
PACK GENERAL/GYN (CUSTOM PROCEDURE TRAY) ×1 IMPLANT
PAD ARMBOARD POSITIONER FOAM (MISCELLANEOUS) ×2 IMPLANT
STAPLER SKIN PROX 35W (STAPLE) ×1 IMPLANT
STOCKINETTE IMPERVIOUS LG (DRAPES) ×1 IMPLANT
SUT BONE WAX W31G (SUTURE) IMPLANT
SUT ETHILON 3 0 PS 1 (SUTURE) IMPLANT
SUT SILK 0 TIES 10X30 (SUTURE) ×1 IMPLANT
SUT SILK 2 0 SH CR/8 (SUTURE) ×1 IMPLANT
SUT SILK 2-0 18XBRD TIE 12 (SUTURE) IMPLANT
SUT SILK 3-0 18XBRD TIE 12 (SUTURE) IMPLANT
SUT VIC AB 2-0 CT1 18 (SUTURE) ×2 IMPLANT
SYR CONTROL 10ML LL (SYRINGE) ×1 IMPLANT
TAPE UMBILICAL 1/8X30 (MISCELLANEOUS) IMPLANT
TOWEL GREEN STERILE (TOWEL DISPOSABLE) ×2 IMPLANT
UNDERPAD 30X36 HEAVY ABSORB (UNDERPADS AND DIAPERS) ×1 IMPLANT
WATER STERILE IRR 1000ML POUR (IV SOLUTION) ×1 IMPLANT

## 2024-01-30 NOTE — Anesthesia Procedure Notes (Signed)
 Anesthesia Regional Block: Adductor canal block   Pre-Anesthetic Checklist: , timeout performed,  Correct Patient, Correct Site, Correct Laterality,  Correct Procedure, Correct Position, site marked,  Risks and benefits discussed,  Pre-op  evaluation,  At surgeon's request and post-op pain management  Laterality: Left  Prep: Maximum Sterile Barrier Precautions used, chloraprep       Needles:  Injection technique: Single-shot  Needle Type: Echogenic Stimulator Needle     Needle Length: 9cm  Needle Gauge: 21     Additional Needles:   Procedures:,,,, ultrasound used (permanent image in chart),,    Narrative:  Start time: 01/30/2024 9:26 AM End time: 01/30/2024 9:29 AM Injection made incrementally with aspirations every 5 mL. Anesthesiologist: Niels Marien CROME, MD

## 2024-01-30 NOTE — Interval H&P Note (Signed)
 History and Physical Interval Note:  01/30/2024 9:10 AM  Phillip [name]  has presented today for surgery, with the diagnosis of non healing L TMA.  The various methods of treatment have been discussed with the patient and family. After consideration of risks, benefits and other options for treatment, the patient has consented to  Procedure(s): AMPUTATION BELOW KNEE (Left) as a surgical intervention.  The patient's history has been reviewed, patient examined, no change in status, stable for surgery.  I have reviewed the patient's chart and labs.  Questions were answered to the patient's satisfaction.     [name]

## 2024-01-30 NOTE — Anesthesia Postprocedure Evaluation (Signed)
 Anesthesia Post Note  Patient: Phillip [name]  Procedure(s) Performed: AMPUTATION BELOW KNEE (Left: Knee)     Patient location during evaluation: PACU Anesthesia Type: Regional and MAC Level of consciousness: awake and alert Pain management: pain level controlled Vital Signs Assessment: post-procedure vital signs reviewed and stable Respiratory status: spontaneous breathing, nonlabored ventilation, respiratory function stable and patient connected to nasal cannula oxygen Cardiovascular status: stable and blood pressure returned to baseline Postop Assessment: no apparent nausea or vomiting Anesthetic complications: no   No notable events documented.  Last Vitals:  Vitals:   01/30/24 1215 01/30/24 1229  BP: 105/61 110/65  Pulse: 75 70  Resp: 16 15  Temp: (!) 36.4 C   SpO2: 96% 99%    Last Pain:  Vitals:   01/30/24 1446  TempSrc:   PainSc: 7                  Zigmund Linse L Willies Laviolette

## 2024-01-30 NOTE — Progress Notes (Addendum)
  Progress Note    01/30/2024 7:49 AM 10 Days Post-Op  Patient scheduled for left BKA today in OR with Dr.Arael Piccione RN asked me to come by because patient is undecided He is very anxious about surgery. Says he is uncertain if he should do BKA vs AKA His wife is on the way and he would like to talk to her before proceeding Keep NPO for now Formal consent pending patients decision to proceed with AKA vs BKA  [name], PA-C [facility] [phone] 01/30/2024 7:49 AM   We had an extensive conversation regarding AKA vs BKA.  He wants to save his knee if possible.  We will plan for left BKA, understanding that there is a possibility of non-healing and need for a AKA  Phillip [name]

## 2024-01-30 NOTE — Anesthesia Procedure Notes (Signed)
 Anesthesia Regional Block: Popliteal block   Pre-Anesthetic Checklist: , timeout performed,  Correct Patient, Correct Site, Correct Laterality,  Correct Procedure, Correct Position, site marked,  Risks and benefits discussed,  Pre-op  evaluation,  At surgeon's request and post-op pain management  Laterality: Left  Prep: Maximum Sterile Barrier Precautions used, chloraprep       Needles:  Injection technique: Single-shot  Needle Type: Echogenic Stimulator Needle     Needle Length: 9cm  Needle Gauge: 21     Additional Needles:   Procedures:,,,, ultrasound used (permanent image in chart),,    Narrative:  Start time: 01/30/2024 9:24 AM End time: 01/30/2024 9:26 AM Injection made incrementally with aspirations every 5 mL. Anesthesiologist: Niels Marien CROME, MD

## 2024-01-30 NOTE — Op Note (Signed)
    Patient name: DARIL WARGA MRN: [id number] DOB: 1955-12-25 Sex: male  01/14/2024 - 01/30/2024 Pre-operative Diagnosis: Nonhealing left transmetatarsal amputation Post-operative diagnosis:  Same Surgeon:  [name] Assistants:  S. Rhyne, PA Procedure:   #1:  Left below-knee potation   #2:  application of first 38 cm2 skin substitute (keresis) Anesthesia:  regional Blood Loss:  minimal Specimens:  left leg  Findings: Viable muscle at amputation site  Indications: This is a 68 year old gentleman with a nonhealing TMA.  I had an extensive conversation with the patient and his wife regarding a more proximal amputation.  He elected to proceed with BKA understanding that if this does not heal he needs a more proximal amputation.  Procedure:  The patient was identified in the holding area and taken to [facility] OR ROOM 11  The patient was then placed supine on the table. regional anesthesia was administered.  The patient was prepped and draped in the usual sterile fashion.  A time out was called and antibiotics were administered.  A PA was necessary to explain the procedure and assist with technical details.  She helped with exposure by providing suction and retraction.  She helped with wound closure.  A two thirds one third posterior flap was created 12 cm distal to the tibial tuberosity.  Cautery was used to divide subcutaneous tissue.  The tibia was circumferentially exposed as well as the fibula.  The periosteum was elevated.  A Gigli saw was used to transect the tibia, beveling the anterior surface.  The fibula was cut proximal to the tibia.  I then divided the remaining muscle with cautery.  The neurovascular bundle was isolated and divided between clamps.  The leg was then removed and sent as a specimen.  The neurovascular bundles were then ligated proximal to the cut edge of the tibia.  The wound was irrigated.  Hemostasis was achieved.  Keresis powder was placed in the wound.  The fascia was  reapproximated 3-0 Vicryl.  The skin was closed staples.   Disposition: To PACU stable.   [name], M.D., [facility] [place] Office: [phone] Pager:  [phone]

## 2024-01-30 NOTE — H&P (View-Only) (Signed)
  Progress Note    01/30/2024 7:49 AM 10 Days Post-Op  Patient scheduled for left BKA today in OR with Dr.Arael Piccione RN asked me to come by because patient is undecided He is very anxious about surgery. Says he is uncertain if he should do BKA vs AKA His wife is on the way and he would like to talk to her before proceeding Keep NPO for now Formal consent pending patients decision to proceed with AKA vs BKA  [name], PA-C [facility] [phone] 01/30/2024 7:49 AM   We had an extensive conversation regarding AKA vs BKA.  He wants to save his knee if possible.  We will plan for left BKA, understanding that there is a possibility of non-healing and need for a AKA  Phillip [name]

## 2024-01-30 NOTE — Progress Notes (Signed)
 Pt received from OR, alert and oriented with compression wrap ace to Left BKA stump.  VS taken and stable, diet order adjusted, and CCMD notified.  Wife at bedside.  Will cont plan of care.

## 2024-01-30 NOTE — Progress Notes (Signed)
 [facility] Progress Note    Dialysis Orders: [place] HD TTS - No heparin  - Left AVF (in use for 2 weeks) - EDW 81 kg - will need EDW lowered at d/c 4 hr 350/800 2K/2.5 Ca Bicarb 39 Mircera: 50 mcg ordered - but has not received any ESA yet No VDRA Renvela  800 mg TID WC   Assessment/Plan: PAD/ Left third toe gangrene: 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 Then TMA completed on 01/17/24 by Dr. Serene.  Plans per VVS -> left TMA necrotic, and will need left BKA which is planned for Friday the patient was uncertain about whether you have a BKA or AKA.  Appears that patient is going to be receiving a BKA.  Vascular has concerns about wound healing at that level but the patient is trying to keep the knee..  Severe Aortic Stenosis: s/p TAVR 01/20/24 Cardiology following. Zio monitor in place.  ESRD:  Usual HD TTS.  Tolerated dialysis on Thursday with 1 L net UF  Next treatment tomorrow on TTS regimen   Hypertension/volume: BP in goal. On carvedilol  3.125mg  BID - hold in AM on HD days. Well below EDW now. Minimal UF with HD.  Chronic HFrEF: Appears euvolemic.   Recent acute cholecystitis requiring cholecystostomy drain 6/25: drain is in place.  Anemia: Hgb 9.6 on 8/27. No indication for ESA at this time.  Metabolic bone disease: restarted home Sevelamer  800 TID WC.  SABRA Not on VDRA at his OP clinic. Phos in range.  DMT2: Insulin  per primary.  Nutrition:  Albumin  low. Cont prot supp. On renal diet with fluid restriction  Subjective:  Seen in room.   Denies any fever, chills, nausea, vomiting.  Appetite is decent.   Objective Vitals:   01/30/24 1145 01/30/24 1200 01/30/24 1215 01/30/24 1229  BP: 114/63 115/64 105/61 110/65  Pulse: 79 72 75 70  Resp: 15 15 16 15   Temp:   (!) 97.5 F (36.4 C)   TempSrc:      SpO2: 97% 97% 96% 99%  Weight:      Height:       Physical Exam General: Alert, in bed at a 30 degree angle, comfortable, nad  Heart: RRR Lungs: Clear   Abdomen: soft, NTND Extremities: no LE edema Dialysis Access: LU AVF +b/t   Filed Weights   01/29/24 0828 01/29/24 1224 01/30/24 0319  Weight: 73.5 kg 72.5 kg 72.4 kg    Intake/Output Summary (Last 24 hours) at 01/30/2024 1358 Last data filed at 01/30/2024 1050 Gross per 24 hour  Intake 660 ml  Output 100 ml  Net 560 ml       Additional Objective Labs: Basic Metabolic Panel: Recent Labs  Lab 01/27/24 0435 01/28/24 0311 01/30/24 0708 01/30/24 0844 01/30/24 0917  NA 132* 134* 135 130* 134*  K 4.3 4.2 4.3 8.3* 4.3  CL 92* 93* 92* 96* 94*  CO2 24 25 27   --   --   GLUCOSE 112* 99 111* 115* 114*  BUN 82* 42* 33* 52* 34*  CREATININE 10.35* 6.74* 6.20* 6.10* 5.90*  CALCIUM  8.7* 8.6* 9.0  --   --   PHOS  --  4.9*  --   --   --    Liver Function Tests: Recent Labs  Lab 01/26/24 0328  AST 17  ALT <5  ALKPHOS 96  BILITOT 0.8  PROT 8.4*  ALBUMIN  2.4*   CBC: Recent Labs  Lab 01/25/24 0329 01/26/24 0328 01/27/24 0435 01/28/24 0311 01/30/24 9291  01/30/24 0844 01/30/24 0917  WBC 13.8* 11.8* 11.0* 11.0* 9.7  --   --   HGB 10.1* 9.8* 9.8* 9.6* 9.8* 11.2* 10.9*  HCT 31.8* 30.1* 30.3* 30.1* 32.1* 33.0* 32.0*  MCV 94.6 94.1 93.8 95.6 97.9  --   --   PLT 188 170 159 148* 173  --   --    CBG: Recent Labs  Lab 01/28/24 2144 01/29/24 0602 01/29/24 1608 01/29/24 2103 01/30/24 0615  GLUCAP 147* 137* 136* 94 109*    Medications:  sodium chloride      sodium chloride       ceFAZolin  (ANCEF ) IV     norepinephrine  (LEVOPHED ) Adult infusion      (feeding supplement) PROSource Plus  30 mL Oral BID BM   aspirin  EC  81 mg Oral Daily   atorvastatin   20 mg Oral Daily   carvedilol   3.125 mg Oral BID   clopidogrel   75 mg Oral Daily   heparin   5,000 Units Subcutaneous Q8H   isosorbide  mononitrate  120 mg Oral Daily   levothyroxine   50 mcg Oral Daily   polyethylene glycol  17 g Oral Daily   sevelamer  carbonate  800 mg Oral TID WC   sodium chloride  flush  3 mL  Intravenous Q12H   tamsulosin   0.4 mg Oral QPC breakfast

## 2024-01-30 NOTE — Transfer of Care (Signed)
 Immediate Anesthesia Transfer of Care Note  Patient: Phillip [name]  Procedure(s) Performed: AMPUTATION BELOW KNEE (Left: Knee)  Patient Location: PACU  Anesthesia Type:MAC  Level of Consciousness: awake and sedated  Airway & Oxygen Therapy: Patient Spontanous Breathing  Post-op Assessment: Report given to RN and Post -op Vital signs reviewed and stable  Post vital signs: Reviewed and stable  Last Vitals:  Vitals Value Taken Time  BP 109/62 01/30/24 11:18  Temp    Pulse 71 01/30/24 11:21  Resp 20 01/30/24 11:21  SpO2 99 % 01/30/24 11:21  Vitals shown include unfiled device data.  Last Pain:  Vitals:   01/30/24 0825  TempSrc: Oral  PainSc: 8       Patients Stated Pain Goal: 3 (01/30/24 0825)  Complications: No notable events documented.

## 2024-01-30 NOTE — Progress Notes (Signed)
 [facility] PROGRESS NOTE    Progress Note  Phillip [name]  FMW:[id number] DOB: 10/06/1955 DOA: 01/14/2024 PCP: [name] LABOR., MD     Brief Narrative:   Phillip [name] is an 68 y.o. male past medical history of end-stage renal disease on hemodialysis Tuesday Thursdays and Saturdays, diabetes mellitus type 2, recent acute cholecystitis in June 2025 requiring colostomy tube placement, chronic HFrEF with last EF of 30% in 2025, aortic stenosis status post TAVR 8/19/ 2025, PAD status post PCI to the left tibia-fibula on 12/26/2023 admitted by vascular surgery on 01/14/2024 for elective left third toe amputation due to gangrenous left third toe underwent angiogram of the left lower extremity on 12/26/2023 at [facility], however due to underlying comorbidities he was transferred to [facility]  Significant Events: 8/13 - left 3rd toe amputation 8/15 - left TMA and wound vac placment 8/19 - TAVR 8/22 - wound vac removed   Assessment/Plan:   Gangrene of toe of left foot (HCC) Status post transmetatarsal amputation with wound VAC placement on 01/16/2024. Wound VAC removed on 01/23/2024. Vascular surgery discussed with patient amputation on 01/30/2024.  Patient said this morning he would like to proceed with an AKA.  Severe aortic stenosis: Cardiology was consulted, status post TAVR's on 01/20/2024.  Chronic HFpEF:  Appears euvolemic continue volume per hemodialysis.  End-stage renal disease: Nephrology on board continue to dialyze Tuesday Thursdays and Saturdays.  CAD with CABG: Last cath on February 2025. Vascular surgery to dictate when to hold aspirin  and Plavix   pending surgery  Recent history of acute cholecystitis requiring cholecystectomy: Drain placed on 11/21/2023. In place has remained afebrile with no leukocytosis.  Controlled diabetes mellitus type 2: Continue sliding scale insulin , last A1c of 5.8.  Hypothyroidism: Continue Synthroid .  Goals of care: PT  OT has been consulted, being evaluated for inpatient rehab.   DVT prophylaxis: lovenox  Family Communication:none Status is: Inpatient Remains inpatient appropriate because: Below the knee amputation    Code Status:     Code Status Orders  (From admission, onward)           Start     Ordered   01/14/24 1727  Full code  Continuous       Question:  By:  Answer:  Consent: discussion documented in EHR   01/14/24 1727           Code Status History     Date Active Date Inactive Code Status Order ID Comments User Context   12/26/2023 1322 12/26/2023 2204 Full Code [id number]  Marea Selinda RAMAN, MD Inpatient   11/12/2023 0613 11/14/2023 2013 Full Code [id number]  Alfornia Madison, MD Inpatient   08/12/2023 1025 08/25/2023 1852 Full Code [id number]  Claudene Maximino LABOR, MD ED   08/01/2023 1314 08/01/2023 2034 Full Code [id number]  Wendel Lurena POUR, MD Inpatient   09/30/2022 1727 09/30/2022 2326 Full Code [id number]  Marea Selinda RAMAN, MD Inpatient   11/06/2020 2006 11/10/2020 1950 Full Code [id number]  Juvenal Harlene PENNER, DO Inpatient   02/18/2020 2326 02/22/2020 1919 Full Code [id number]  Tobie Jorie SAUNDERS, MD ED   09/22/2018 1831 09/26/2018 1649 Full Code [id number]  Jordis Laneta FALCON, MD Inpatient   09/08/2018 1838 09/21/2018 2107 Full Code [id number]  Jordis Laneta FALCON, MD Inpatient   03/13/2018 1152 03/13/2018 1730 Full Code [id number]  Ashley Soulier, [facility] Inpatient   03/02/2018 1033 03/02/2018 1539 Full Code [id number]  Marea Selinda RAMAN, MD Inpatient         IV Access:  Peripheral IV   Procedures and diagnostic studies:   No results found.   Medical Consultants:   None.   Subjective:    Phillip [name] pain is controlled ready to have surgery.  Objective:    Vitals:   01/29/24 1958 01/29/24 2336 01/30/24 0319 01/30/24 0733  BP: 94/62 92/61 106/64 99/63  Pulse: 100 68 85 78  Resp: 14 16 17 16   Temp: 97.8 F (36.6 C) 98.3 F (36.8 C) 98.4 F (36.9 C) 97.9 F (36.6 C)  TempSrc: Oral Oral Oral Oral   SpO2:  96% 95% 100%  Weight:   72.4 kg   Height:       SpO2: 100 % O2 Flow Rate (L/min): 0 L/min   Intake/Output Summary (Last 24 hours) at 01/30/2024 0811 Last data filed at 01/29/2024 2000 Gross per 24 hour  Intake 60 ml  Output 1000 ml  Net -940 ml    Filed Weights   01/29/24 0828 01/29/24 1224 01/30/24 0319  Weight: 73.5 kg 72.5 kg 72.4 kg    Exam: General exam: In no acute distress. Respiratory system: Good air movement and clear to auscultation. Cardiovascular system: S1 & S2 heard, RRR. No JVD. Gastrointestinal system: Abdomen is nondistended, soft and nontender.  Extremities: No pedal edema. Skin: No rashes, lesions or ulcers Psychiatry: Judgement and insight appear normal. Mood & affect appropriate.   Data Reviewed:    Labs: Basic Metabolic Panel: Recent Labs  Lab 01/24/24 0353 01/25/24 0329 01/26/24 0328 01/27/24 0435 01/28/24 0311  NA 134* 135 134* 132* 134*  K 4.7 4.6 4.4 4.3 4.2  CL 92* 94* 94* 92* 93*  CO2 22 22 25 24 25   GLUCOSE 122* 97 145* 112* 99  BUN 78* 46* 64* 82* 42*  CREATININE 10.31* 7.03* 9.00* 10.35* 6.74*  CALCIUM  8.7* 8.8* 8.9 8.7* 8.6*  MG  --   --  2.3 2.3  --   PHOS  --   --   --   --  4.9*   GFR Estimated Creatinine Clearance: 10.7 mL/min (A) (by C-G formula based on SCr of 6.74 mg/dL (H)). Liver Function Tests: Recent Labs  Lab 01/26/24 0328  AST 17  ALT <5  ALKPHOS 96  BILITOT 0.8  PROT 8.4*  ALBUMIN  2.4*   No results for input(s): LIPASE, AMYLASE in the last 168 hours. No results for input(s): AMMONIA in the last 168 hours. Coagulation profile No results for input(s): INR, PROTIME in the last 168 hours. COVID-19 Labs  No results for input(s): DDIMER, FERRITIN, LDH, CRP in the last 72 hours.  Lab Results  Component Value Date   SARSCOV2NAA NEGATIVE 11/11/2023   SARSCOV2NAA NEGATIVE 11/06/2020   SARSCOV2NAA NEGATIVE 02/18/2020   SARSCOV2NAA NEGATIVE 10/20/2018    CBC: Recent Labs   Lab 01/24/24 0353 01/25/24 0329 01/26/24 0328 01/27/24 0435 01/28/24 0311  WBC 12.2* 13.8* 11.8* 11.0* 11.0*  HGB 10.8* 10.1* 9.8* 9.8* 9.6*  HCT 33.7* 31.8* 30.1* 30.3* 30.1*  MCV 93.9 94.6 94.1 93.8 95.6  PLT 178 188 170 159 148*   Cardiac Enzymes: No results for input(s): CKTOTAL, CKMB, CKMBINDEX, TROPONINI in the last 168 hours. BNP (last 3 results) Recent Labs    07/14/23 1412  PROBNP 11,269*   CBG: Recent Labs  Lab 01/28/24 2144 01/29/24 0602 01/29/24 1608 01/29/24 2103 01/30/24 0615  GLUCAP 147* 137* 136* 94 109*   D-Dimer: No results for input(s): DDIMER in the last 72 hours. Hgb A1c: No results for input(s): HGBA1C in the  last 72 hours. Lipid Profile: No results for input(s): CHOL, HDL, LDLCALC, TRIG, CHOLHDL, LDLDIRECT in the last 72 hours. Thyroid  function studies: No results for input(s): TSH, T4TOTAL, T3FREE, THYROIDAB in the last 72 hours.  Invalid input(s): FREET3 Anemia work up: No results for input(s): VITAMINB12, FOLATE, FERRITIN, TIBC, IRON , RETICCTPCT in the last 72 hours. Sepsis Labs: Recent Labs  Lab 01/25/24 0329 01/26/24 0328 01/27/24 0435 01/28/24 0311  WBC 13.8* 11.8* 11.0* 11.0*   Microbiology No results found for this or any previous visit (from the past 240 hours).   Medications:    (feeding supplement) PROSource Plus  30 mL Oral BID BM   aspirin  EC  81 mg Oral Daily   atorvastatin   20 mg Oral Daily   carvedilol   3.125 mg Oral BID   Chlorhexidine  Gluconate Cloth  6 each Topical Q0600   clopidogrel   75 mg Oral Daily   heparin   5,000 Units Subcutaneous Q8H   isosorbide  mononitrate  120 mg Oral Daily   levothyroxine   50 mcg Oral Daily   polyethylene glycol  17 g Oral Daily   sevelamer  carbonate  800 mg Oral TID WC   sodium chloride  flush  3 mL Intravenous Q12H   tamsulosin   0.4 mg Oral QPC breakfast   Continuous Infusions:  sodium chloride      norepinephrine  (LEVOPHED )  Adult infusion        LOS: 16 days   Phillip [name]  [facility]  01/30/2024, 8:11 AM

## 2024-01-30 NOTE — Anesthesia Preprocedure Evaluation (Addendum)
 Anesthesia Evaluation  Patient identified by MRN, date of birth, ID band Patient awake    Reviewed: Allergy & Precautions, NPO status , Patient's Chart, lab work & pertinent test results, reviewed documented beta blocker date and time   Airway Mallampati: II  TM Distance: >3 FB Neck ROM: Full    Dental  (+) Edentulous Upper, Edentulous Lower   Pulmonary asthma , sleep apnea , former smoker   Pulmonary exam normal breath sounds clear to auscultation       Cardiovascular hypertension, Pt. on home beta blockers and Pt. on medications pulmonary hypertension+ CAD, + Past MI, + CABG, + Peripheral Vascular Disease and +CHF  Normal cardiovascular exam+ Valvular Problems/Murmurs (s/p TAVR 01/20/2024) AS and MR  Rhythm:Regular Rate:Normal  An echocardiogram in 06/2023 (outside films) showed EF 30-35%, severe LFLG AS with mean grad 28 mmHg, Vmax 3.1 m/s, AVA 0.9cm2, SVI 23. L/RHC on 08/01/23 showed showed severe native three-vessel disease with all 3 vein grafts occluded.  There was a patent LIMA to the LAD with collaterals to the distal right coronary artery.  There was moderate pulmonary hypertension at 66/21 with a mean of 39.   TTE 01/2024 1. Left ventricular ejection fraction, by estimation, is 30 to 35%. The  left ventricle has moderately decreased function. The left ventricle  demonstrates regional wall motion abnormalities (see scoring  diagram/findings for description). The left  ventricular internal cavity size was moderately dilated. There is mild  concentric left ventricular hypertrophy. Left ventricular diastolic  parameters are consistent with Grade I diastolic dysfunction (impaired  relaxation).   2. The mitral valve is degenerative. Mild to moderate mitral valve  regurgitation. No evidence of mitral stenosis. Moderate mitral annular  calcification.   3. The aortic valve has been repaired/replaced. There is a 29 mm Edwards  Sapien  prosthetic (TAVR) valve present in the aortic position. Procedure  Date: 01/20/2024. Echo findings are consistent with trivial perivalvular  leak of the aortic prosthesis.   4. Right ventricular systolic function is moderately reduced. The right  ventricular size is normal.   5. Left atrial size was mildly dilated.     Neuro/Psych negative neurological ROS  negative psych ROS   GI/Hepatic Neg liver ROS,GERD  ,,  Endo/Other  diabetes, Type 2, Insulin  DependentHypothyroidism    Renal/GU ESRF and DialysisRenal disease  negative genitourinary   Musculoskeletal negative musculoskeletal ROS (+)    Abdominal   Peds  Hematology  (+) Blood dyscrasia (plavix ), anemia   Anesthesia Other Findings   Reproductive/Obstetrics                              Anesthesia Physical Anesthesia Plan  ASA: 4  Anesthesia Plan: Regional and MAC   Post-op Pain Management: Regional block* and Tylenol  PO (pre-op )*   Induction:   PONV Risk Score and Plan: 1 and Midazolam , Ondansetron , Propofol  infusion and Treatment may vary due to age or medical condition  Airway Management Planned: Natural Airway and Simple Face Mask  Additional Equipment: ClearSight  Intra-op Plan:   Post-operative Plan:   Informed Consent: I have reviewed the patients History and Physical, chart, labs and discussed the procedure including the risks, benefits and alternatives for the proposed anesthesia with the patient or authorized representative who has indicated his/her understanding and acceptance.     Dental advisory given  Plan Discussed with: CRNA  Anesthesia Plan Comments:          Anesthesia Quick  Evaluation

## 2024-01-31 DIAGNOSIS — I96 Gangrene, not elsewhere classified: Secondary | ICD-10-CM | POA: Diagnosis not present

## 2024-01-31 LAB — BASIC METABOLIC PANEL WITH GFR
Anion gap: 18 — ABNORMAL HIGH (ref 5–15)
BUN: 45 mg/dL — ABNORMAL HIGH (ref 8–23)
CO2: 22 mmol/L (ref 22–32)
Calcium: 8.3 mg/dL — ABNORMAL LOW (ref 8.9–10.3)
Chloride: 92 mmol/L — ABNORMAL LOW (ref 98–111)
Creatinine, Ser: 7.03 mg/dL — ABNORMAL HIGH (ref 0.61–1.24)
GFR, Estimated: 8 mL/min — ABNORMAL LOW (ref >60)
Glucose, Bld: 169 mg/dL — ABNORMAL HIGH (ref 70–99)
Potassium: 4.7 mmol/L (ref 3.5–5.1)
Sodium: 132 mmol/L — ABNORMAL LOW (ref 135–145)

## 2024-01-31 LAB — GLUCOSE, CAPILLARY
Glucose-Capillary: 128 mg/dL — ABNORMAL HIGH (ref 70–99)
Glucose-Capillary: 129 mg/dL — ABNORMAL HIGH (ref 70–99)
Glucose-Capillary: 147 mg/dL — ABNORMAL HIGH (ref 70–99)
Glucose-Capillary: 115 mg/dL — ABNORMAL HIGH (ref 70–99)

## 2024-01-31 LAB — CBC
HCT: 27.6 % — ABNORMAL LOW (ref 39.0–52.0)
Hemoglobin: 8.5 g/dL — ABNORMAL LOW (ref 13.0–17.0)
MCH: 29.6 pg (ref 26.0–34.0)
MCHC: 30.8 g/dL (ref 30.0–36.0)
MCV: 96.2 fL (ref 80.0–100.0)
Platelets: 139 K/uL — ABNORMAL LOW (ref 150–400)
RBC: 2.87 MIL/uL — ABNORMAL LOW (ref 4.22–5.81)
RDW: 14.9 % (ref 11.5–15.5)
WBC: 10.3 K/uL (ref 4.0–10.5)
nRBC: 0 % (ref 0.0–0.2)

## 2024-01-31 MED ORDER — OXYCODONE HCL 5 MG PO TABS
ORAL_TABLET | ORAL | Status: AC
Start: 2024-01-31 — End: 2024-01-31
  Filled 2024-01-31: qty 2

## 2024-01-31 NOTE — Progress Notes (Signed)
 [facility] Progress Note    Dialysis Orders: [place] HD TTS - No heparin  - Left AVF (in use for 2 weeks) - EDW 81 kg - will need EDW lowered at d/c 4 hr 350/800 2K/2.5 Ca Bicarb 39 Mircera: 50 mcg ordered - but has not received any ESA yet No VDRA Renvela  800 mg TID WC   Assessment/Plan: PAD/ Left third toe gangrene: 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 Then TMA completed on 01/17/24 by Dr. Serene.  Plans per VVS -> left TMA necrotic, and will need left BKA which is planned for Friday the patient was uncertain about whether you have a BKA or AKA -> eventually left BKA on 8/29.   Vascular has concerns about wound healing at that level but the patient is trying to keep the knee..  Severe Aortic Stenosis: s/p TAVR 01/20/24 Cardiology following. Zio monitor in place.  ESRD:  Usual HD TTS.   Seen on dialysis through a left upper arm access 127/60 six 2K bath 2 L goal UF, tolerating.  No changes at this current time  Continue on TTS regimen   Hypertension/volume: BP in goal. On carvedilol  3.125mg  BID - hold in AM on HD days. Well below EDW now. Minimal UF with HD.  Chronic HFrEF: Appears euvolemic.   Recent acute cholecystitis requiring cholecystostomy drain 6/25: drain is in place.  Anemia: Hgb 9.6 on 8/27. No indication for ESA at this time.  Metabolic bone disease: restarted home Sevelamer  800 TID WC.  SABRA Not on VDRA at his OP clinic. Phos in range.  DMT2: Insulin  per primary.  Nutrition:  Albumin  low. Cont prot supp. On renal diet with fluid restriction  Subjective:  Seen in dialysis.   Denies any fever, chills, nausea, vomiting.  Appetite is decent.  Positive BM overnight, no diarrhea; denies shortness of breath.  Objective Vitals:   01/31/24 0827 01/31/24 0830 01/31/24 0845 01/31/24 0900  BP: 118/67 118/65 125/63 127/66  Pulse: 69 68 64 67  Resp: 16 17 14  (!) 21  Temp: (!) 97.3 F (36.3 C)     TempSrc:      SpO2: 99% 98% 99% 98%  Weight: 73.1 kg      Height:       Physical Exam General: Alert, in bed at a 30 degree angle, comfortable, nad  Heart: RRR Lungs: Clear  Abdomen: soft, NTND Extremities: no LE edema, Lt BKA Dialysis Access: LU AVF +b/t    Filed Weights   01/30/24 0319 01/31/24 0236 01/31/24 0827  Weight: 72.4 kg 72.5 kg 73.1 kg    Intake/Output Summary (Last 24 hours) at 01/31/2024 0918 Last data filed at 01/30/2024 1050 Gross per 24 hour  Intake 600 ml  Output 100 ml  Net 500 ml       Additional Objective Labs: Basic Metabolic Panel: Recent Labs  Lab 01/28/24 0311 01/30/24 0708 01/30/24 0844 01/30/24 0917 01/31/24 0341  NA 134* 135 130* 134* 132*  K 4.2 4.3 8.3* 4.3 4.7  CL 93* 92* 96* 94* 92*  CO2 25 27  --   --  22  GLUCOSE 99 111* 115* 114* 169*  BUN 42* 33* 52* 34* 45*  CREATININE 6.74* 6.20* 6.10* 5.90* 7.03*  CALCIUM  8.6* 9.0  --   --  8.3*  PHOS 4.9*  --   --   --   --    Liver Function Tests: Recent Labs  Lab 01/26/24 0328  AST 17  ALT <5  ALKPHOS 96  BILITOT 0.8  PROT 8.4*  ALBUMIN  2.4*   CBC: Recent Labs  Lab 01/26/24 0328 01/27/24 0435 01/28/24 0311 01/30/24 0708 01/30/24 0844 01/30/24 0917 01/31/24 0341  WBC 11.8* 11.0* 11.0* 9.7  --   --  10.3  HGB 9.8* 9.8* 9.6* 9.8* 11.2* 10.9* 8.5*  HCT 30.1* 30.3* 30.1* 32.1* 33.0* 32.0* 27.6*  MCV 94.1 93.8 95.6 97.9  --   --  96.2  PLT 170 159 148* 173  --   --  139*   CBG: Recent Labs  Lab 01/30/24 0615 01/30/24 1121 01/30/24 1700 01/30/24 2053 01/31/24 0622  GLUCAP 109* 128* 148* 158* 129*    Medications:  sodium chloride      norepinephrine  (LEVOPHED ) Adult infusion      (feeding supplement) PROSource Plus  30 mL Oral BID BM   aspirin  EC  81 mg Oral Daily   atorvastatin   20 mg Oral Daily   carvedilol   3.125 mg Oral BID   clopidogrel   75 mg Oral Daily   heparin   5,000 Units Subcutaneous Q8H   isosorbide  mononitrate  120 mg Oral Daily   levothyroxine   50 mcg Oral Daily   polyethylene glycol  17 g Oral  Daily   sevelamer  carbonate  800 mg Oral TID WC   sodium chloride  flush  3 mL Intravenous Q12H   tamsulosin   0.4 mg Oral QPC breakfast

## 2024-01-31 NOTE — Evaluation (Signed)
 Occupational Therapy Evaluation Patient Details Name: Phillip [name] MRN: [id number] DOB: July 06, 1955 Today's Date: 01/31/2024   History of Present Illness   Phillip [name] is a 68 y.o. male admitted 01/14/24 for left third toe gangrene. Pt s/p left third toe amputation including met head 8/13 and left TMA with wound vac application 8/15. Currently s/p then s/p L BKA 01/30/24 TAVR on 8/19. Pt s/p  PMHx: T1DM, PAD s/p angioplasty/PCI to left tibioperoneal trunk on 7/25, ESRD on HD TTS, HTN, chronic HFrEF, severe aortic stenosis, acute cholecystitis requiring cholecystostomy drain 6/25     Clinical Impressions Pt presented in bed and reported getting back from HD. Pt agreeable to attempt to get to chair for breakfast. At this time required mostly CGA for lateral transfer but needed one mod assist with a scoot to chair. Pt needs set up for UE ADLS and opening items on tray. Patient will benefit from intensive inpatient follow-up therapy, >3 hours/day.     If plan is discharge home, recommend the following:   A lot of help with walking and/or transfers;A lot of help with bathing/dressing/bathroom     Functional Status Assessment   Patient has had a recent decline in their functional status and demonstrates the ability to make significant improvements in function in a reasonable and predictable amount of time.     Equipment Recommendations    (TBD)     Recommendations for Other Services         Precautions/Restrictions   Precautions Precautions: Fall Recall of Precautions/Restrictions: Intact Precaution/Restrictions Comments: R abdominal drain Restrictions Weight Bearing Restrictions Per Provider Order: Yes LLE Weight Bearing Per Provider Order: Non weight bearing     Mobility Bed Mobility Overal bed mobility: Needs Assistance Bed Mobility: Supine to Sit, Sit to Supine     Supine to sit: Contact guard Sit to supine: Contact guard assist   General bed mobility  comments: Pt needs cues on hand placement    Transfers                   General transfer comment: Pt decline sit to stand transfers but agreeable to OOB      Balance Overall balance assessment: Needs assistance Sitting-balance support: Feet supported Sitting balance-Leahy Scale: Good                                     ADL either performed or assessed with clinical judgement   ADL Overall ADL's : Needs assistance/impaired Eating/Feeding: Set up;Sitting   Grooming: Wash/dry face;Oral care;Set up;Sitting   Upper Body Bathing: Set up;Sitting   Lower Body Bathing: Maximal assistance;Sitting/lateral leans   Upper Body Dressing : Set up;Sitting   Lower Body Dressing: Maximal assistance;Sitting/lateral leans       Toileting- Clothing Manipulation and Hygiene: Maximal assistance;Total assistance               Vision Baseline Vision/History: 1 Wears glasses       Perception         Praxis         Pertinent Vitals/Pain Pain Assessment Pain Assessment: 0-10 Pain Score: 8  Breathing: normal Negative Vocalization: none Facial Expression: sad, frightened, frown Body Language: relaxed Consolability: no need to console PAINAD Score: 1 Pain Location: L foot Pain Descriptors / Indicators: Aching, Operative site guarding Pain Intervention(s): Limited activity within patient's tolerance, Monitored during session, Repositioned     Extremity/Trunk Assessment  Upper Extremity Assessment Upper Extremity Assessment: Generalized weakness   Lower Extremity Assessment Lower Extremity Assessment: Defer to PT evaluation   Cervical / Trunk Assessment Cervical / Trunk Assessment: Normal   Communication Communication Communication: No apparent difficulties   Cognition Arousal: Alert Behavior During Therapy: WFL for tasks assessed/performed Cognition: Cognition impaired     Awareness: Online awareness impaired     Executive functioning  impairment (select all impairments): Problem solving                   Following commands: Intact       Cueing  General Comments   Cueing Techniques: Verbal cues      Exercises     Shoulder Instructions      Home Living Family/patient expects to be discharged to:: Private residence Living Arrangements: Spouse/significant other Available Help at Discharge: Family;Available 24 hours/day Type of Home: House Home Access: Stairs to enter [employer] of Steps: 2 Entrance Stairs-Rails: Right;Left;Can reach both Home Layout: One level     Bathroom Shower/Tub: Walk-in shower;Tub/shower unit   Bathroom Toilet: Handicapped height Bathroom Accessibility: Yes How Accessible: Accessible via walker Home Equipment: Rolling Walker (2 wheels);Shower seat;BSC/3in1;Grab bars - toilet;Other (comment)          Prior Functioning/Environment Prior Level of Function : Independent/Modified Independent             Mobility Comments: Ambulates with AD. No falls in the past 32mo. ADLs Comments: Ind with ADLs, wife manages medications and assists with IADLs    OT Problem List: Decreased strength;Decreased activity tolerance;Impaired balance (sitting and/or standing);Decreased safety awareness;Decreased knowledge of use of DME or AE;Cardiopulmonary status limiting activity   OT Treatment/Interventions: Self-care/ADL training;Therapeutic exercise;Energy conservation;DME and/or AE instruction;Therapeutic activities;Patient/family education;Balance training      OT Goals(Current goals can be found in the care plan section)   Acute Rehab OT Goals Patient Stated Goal: to get better OT Goal Formulation: With patient Time For Goal Achievement: 02/14/24 Potential to Achieve Goals: Good   OT Frequency:  Min 2X/week    Co-evaluation              AM-PAC OT 6 Clicks Daily Activity     Outcome Measure Help from another person eating meals?: None Help from another  person taking care of personal grooming?: A Little Help from another person toileting, which includes using toliet, bedpan, or urinal?: A Lot Help from another person bathing (including washing, rinsing, drying)?: A Lot Help from another person to put on and taking off regular upper body clothing?: A Little Help from another person to put on and taking off regular lower body clothing?: A Lot 6 Click Score: 16   End of Session Equipment Utilized During Treatment: Gait belt Nurse Communication: Mobility status  Activity Tolerance: Patient tolerated treatment well Patient left: in chair;with call bell/phone within reach;with chair alarm set;with nursing/sitter in room  OT Visit Diagnosis: Unsteadiness on feet (R26.81);Other abnormalities of gait and mobility (R26.89);Muscle weakness (generalized) (M62.81)                Time: 8698-8672 OT Time Calculation (min): 26 min Charges:  OT General Charges $OT Visit: 1 Visit OT Treatments $Self Care/Home Management : 8-22 mins  Warrick POUR OTR/L  Acute Rehab Services  [phone] office number   Warrick Berber 01/31/2024, 2:20 PM

## 2024-01-31 NOTE — Progress Notes (Signed)
 [facility] PROGRESS NOTE    Progress Note  Phillip [name]  FMW:[id number] DOB: Jan 23, 1956 DOA: 01/14/2024 PCP: [name] LABOR., MD     Brief Narrative:   Phillip [name] is an 68 y.o. male past medical history of end-stage renal disease on hemodialysis Tuesday Thursdays and Saturdays, diabetes mellitus type 2, recent acute cholecystitis in June 2025 requiring colostomy tube placement, chronic HFrEF with last EF of 30% in 2025, aortic stenosis status post TAVR 8/19/ 2025, PAD status post PCI to the left tibia-fibula on 12/26/2023 admitted by vascular surgery on 01/14/2024 for elective left third toe amputation due to gangrenous left third toe underwent angiogram of the left lower extremity on 12/26/2023 at [facility], however due to underlying comorbidities he was transferred to [facility]  Significant Events: 8/13 - left 3rd toe amputation 8/15 - left TMA and wound vac placment 8/19 - TAVR 8/22 - wound vac removed   Assessment/Plan:   Gangrene of toe of left foot (HCC) Status post transmetatarsal amputation with wound VAC placement on 01/16/2024. Wound VAC removed on 01/23/2024. Vascular surgery discussed status post BKA on 01/30/2024.   Hemoglobin stable at 8.5.  Continue aspirin  and Plavix   Severe aortic stenosis: Cardiology was consulted, status post TAVR's on 01/20/2024.  Chronic HFpEF:  Appears euvolemic continue volume per hemodialysis.  End-stage renal disease: Nephrology on board continue to dialyze Tuesday Thursdays and Saturdays.  CAD with CABG: Last cath on February 2025. Continue aspirin  and Plavix .  Recent history of acute cholecystitis requiring cholecystectomy: Drain placed on 11/21/2023. In place has remained afebrile with no leukocytosis.  Controlled diabetes mellitus type 2: Continue sliding scale insulin , last A1c of 5.8.  Hypothyroidism: Continue Synthroid .  Goals of care: PT OT has been consulted, being evaluated for inpatient  rehab.   DVT prophylaxis: lovenox  Family Communication:none Status is: Inpatient Remains inpatient appropriate because: Below the knee amputation    Code Status:     Code Status Orders  (From admission, onward)           Start     Ordered   01/14/24 1727  Full code  Continuous       Question:  By:  Answer:  Consent: discussion documented in EHR   01/14/24 1727           Code Status History     Date Active Date Inactive Code Status Order ID Comments User Context   12/26/2023 1322 12/26/2023 2204 Full Code [id number]  Marea Selinda RAMAN, MD Inpatient   11/12/2023 0613 11/14/2023 2013 Full Code [id number]  Alfornia Madison, MD Inpatient   08/12/2023 1025 08/25/2023 1852 Full Code [id number]  Claudene Maximino LABOR, MD ED   08/01/2023 1314 08/01/2023 2034 Full Code [id number]  Wendel Lurena POUR, MD Inpatient   09/30/2022 1727 09/30/2022 2326 Full Code [id number]  Marea Selinda RAMAN, MD Inpatient   11/06/2020 2006 11/10/2020 1950 Full Code [id number]  Juvenal Harlene PENNER, DO Inpatient   02/18/2020 2326 02/22/2020 1919 Full Code [id number]  Tobie Jorie SAUNDERS, MD ED   09/22/2018 1831 09/26/2018 1649 Full Code [id number]  Jordis Laneta FALCON, MD Inpatient   09/08/2018 1838 09/21/2018 2107 Full Code [id number]  Jordis Laneta FALCON, MD Inpatient   03/13/2018 1152 03/13/2018 1730 Full Code [id number]  Ashley Soulier, [facility] Inpatient   03/02/2018 1033 03/02/2018 1539 Full Code [id number]  Marea Selinda RAMAN, MD Inpatient         IV Access:   Peripheral IV   Procedures and diagnostic studies:  No results found.   Medical Consultants:   None.   Subjective:    Phillip [name].  His pain control had a bowel movement.  Objective:    Vitals:   01/31/24 0845 01/31/24 0900 01/31/24 0930 01/31/24 1000  BP: 125/63 127/66 121/63 121/61  Pulse: 64 67 (!) 58 (!) 59  Resp: 14 (!) 21 13 12   Temp:      TempSrc:      SpO2: 99% 98% 99% 100%  Weight:      Height:       SpO2: 100 % O2 Flow Rate (L/min): 0 L/min   Intake/Output  Summary (Last 24 hours) at 01/31/2024 1015 Last data filed at 01/30/2024 1050 Gross per 24 hour  Intake 550 ml  Output 100 ml  Net 450 ml    Filed Weights   01/30/24 0319 01/31/24 0236 01/31/24 0827  Weight: 72.4 kg 72.5 kg 73.1 kg    Exam: General exam: In no acute distress. Respiratory system: Good air movement and clear to auscultation. Cardiovascular system: S1 & S2 heard, RRR. No JVD. Gastrointestinal system: Abdomen is nondistended, soft and nontender.  Extremities: No pedal edema. Skin: No rashes, lesions or ulcers Psychiatry: Judgement and insight appear normal. Mood & affect appropriate.   Data Reviewed:    Labs: Basic Metabolic Panel: Recent Labs  Lab 01/26/24 0328 01/27/24 0435 01/28/24 0311 01/30/24 0708 01/30/24 0844 01/30/24 0917 01/31/24 0341  NA 134* 132* 134* 135 130* 134* 132*  K 4.4 4.3 4.2 4.3 8.3* 4.3 4.7  CL 94* 92* 93* 92* 96* 94* 92*  CO2 25 24 25 27   --   --  22  GLUCOSE 145* 112* 99 111* 115* 114* 169*  BUN 64* 82* 42* 33* 52* 34* 45*  CREATININE 9.00* 10.35* 6.74* 6.20* 6.10* 5.90* 7.03*  CALCIUM  8.9 8.7* 8.6* 9.0  --   --  8.3*  MG 2.3 2.3  --   --   --   --   --   PHOS  --   --  4.9*  --   --   --   --    GFR Estimated Creatinine Clearance: 10.4 mL/min (A) (by C-G formula based on SCr of 7.03 mg/dL (H)). Liver Function Tests: Recent Labs  Lab 01/26/24 0328  AST 17  ALT <5  ALKPHOS 96  BILITOT 0.8  PROT 8.4*  ALBUMIN  2.4*   No results for input(s): LIPASE, AMYLASE in the last 168 hours. No results for input(s): AMMONIA in the last 168 hours. Coagulation profile No results for input(s): INR, PROTIME in the last 168 hours. COVID-19 Labs  No results for input(s): DDIMER, FERRITIN, LDH, CRP in the last 72 hours.  Lab Results  Component Value Date   SARSCOV2NAA NEGATIVE 11/11/2023   SARSCOV2NAA NEGATIVE 11/06/2020   SARSCOV2NAA NEGATIVE 02/18/2020   SARSCOV2NAA NEGATIVE 10/20/2018    CBC: Recent Labs   Lab 01/26/24 0328 01/27/24 0435 01/28/24 0311 01/30/24 0708 01/30/24 0844 01/30/24 0917 01/31/24 0341  WBC 11.8* 11.0* 11.0* 9.7  --   --  10.3  HGB 9.8* 9.8* 9.6* 9.8* 11.2* 10.9* 8.5*  HCT 30.1* 30.3* 30.1* 32.1* 33.0* 32.0* 27.6*  MCV 94.1 93.8 95.6 97.9  --   --  96.2  PLT 170 159 148* 173  --   --  139*   Cardiac Enzymes: No results for input(s): CKTOTAL, CKMB, CKMBINDEX, TROPONINI in the last 168 hours. BNP (last 3 results) Recent Labs    07/14/23 1412  PROBNP  11,269*   CBG: Recent Labs  Lab 01/30/24 0615 01/30/24 1121 01/30/24 1700 01/30/24 2053 01/31/24 0622  GLUCAP 109* 128* 148* 158* 129*   D-Dimer: No results for input(s): DDIMER in the last 72 hours. Hgb A1c: No results for input(s): HGBA1C in the last 72 hours. Lipid Profile: No results for input(s): CHOL, HDL, LDLCALC, TRIG, CHOLHDL, LDLDIRECT in the last 72 hours. Thyroid  function studies: No results for input(s): TSH, T4TOTAL, T3FREE, THYROIDAB in the last 72 hours.  Invalid input(s): FREET3 Anemia work up: No results for input(s): VITAMINB12, FOLATE, FERRITIN, TIBC, IRON , RETICCTPCT in the last 72 hours. Sepsis Labs: Recent Labs  Lab 01/27/24 0435 01/28/24 0311 01/30/24 0708 01/31/24 0341  WBC 11.0* 11.0* 9.7 10.3   Microbiology No results found for this or any previous visit (from the past 240 hours).   Medications:    (feeding supplement) PROSource Plus  30 mL Oral BID BM   aspirin  EC  81 mg Oral Daily   atorvastatin   20 mg Oral Daily   carvedilol   3.125 mg Oral BID   clopidogrel   75 mg Oral Daily   heparin   5,000 Units Subcutaneous Q8H   isosorbide  mononitrate  120 mg Oral Daily   levothyroxine   50 mcg Oral Daily   sevelamer  carbonate  800 mg Oral TID WC   sodium chloride  flush  3 mL Intravenous Q12H   tamsulosin   0.4 mg Oral QPC breakfast   Continuous Infusions:  sodium chloride      norepinephrine  (LEVOPHED ) Adult infusion         LOS: 17 days   Phillip [name]  [facility]  01/31/2024, 10:15 AM

## 2024-01-31 NOTE — Progress Notes (Signed)
   01/31/24 1216  Vitals  Temp 98.1 F (36.7 C)  Pulse Rate 78  Resp 19  BP 114/64  SpO2 99 %  Weight 72.3 kg  Type of Weight Post-Dialysis  Oxygen Therapy  Patient Activity (if Appropriate) In bed  Post Treatment  Dialyzer Clearance Clotted  Hemodialysis Intake (mL) 0 mL  Liters Processed 74  Fluid Removed (mL) 1400 mL  Tolerated HD Treatment Yes  Post-Hemodialysis Comments tx terminated 40 mins early due to clotting  AVG/AVF Arterial Site Held (minutes) 10 minutes  AVG/AVF Venous Site Held (minutes) 10 minutes   Received patient in bed to unit.  Alert and oriented.  Informed consent signed and in chart.   TX duration:3hrs 14 mins due to clotting  Patient tolerated well.  Transported back to the room  Alert, without acute distress.  Hand-off given to patient's nurse.   Access used: LAVF Access issues: NONE  Total UF removed: 1.4L Medication(s) given: PAIN MED    [facility]

## 2024-01-31 NOTE — Progress Notes (Signed)
 Physical Therapy Re-Evaluation Patient Details Name: Phillip [name] MRN: [id number] DOB: 1956-01-12 Today's Date: 01/31/2024   History of Present Illness Phillip [name] is a 68 y.o. male admitted 01/14/24 for left third toe gangrene. Pt s/p left third toe amputation including met head 8/13 and left TMA with wound vac application 8/15. Currently s/p then s/p L BKA 01/30/24. TAVR on 8/19. Pt s/p  PMHx: T1DM, PAD s/p angioplasty/PCI to left tibioperoneal trunk on 7/25, ESRD on HD TTS, HTN, chronic HFrEF, severe aortic stenosis, acute cholecystitis requiring cholecystostomy drain 6/25    PT Comments  Pt goals updated. Pt requiring increased assist from prior to BKA. Currently pt was unable to get to standing from sitting with total A. Pt attempted 3x clearing the hips but unable on the 4th attempt due to fatigue. Due to pt current functional status, home set up and available assistance at home recommending skilled physical therapy services > 3 hours/day in order to address strength, balance and functional mobility to decrease risk for falls, injury, immobility, skin break down and re-hospitalization.      If plan is discharge home, recommend the following: Assistance with cooking/housework;Assist for transportation;Help with stairs or ramp for entrance;A lot of help with walking and/or transfers   Can travel by private vehicle     No  Equipment Recommendations  Wheelchair cushion (measurements PT);Wheelchair (measurements PT);BSC/3in1;Other (comment) (with residual limb pad and possibly sliding board)       Precautions / Restrictions Precautions Precautions: Fall Recall of Precautions/Restrictions: Intact Required Braces or Orthoses: Other Brace Other Brace: limb protector Restrictions Weight Bearing Restrictions Per Provider Order: Yes LLE Weight Bearing Per Provider Order: Non weight bearing     Mobility  Bed Mobility     General bed mobility comments: Pt up in recliner on  arrival    Transfers Overall transfer level: Needs assistance Equipment used: Rolling walker (2 wheels) Transfers: Sit to/from Stand Sit to Stand: Total assist           General transfer comment: attempted to perform sit to stand 4x from recliner with verbal cues for hand placement and pt unable. Cues for wgt shifting R pt able to clear hips 3x but unable on the 4th attempt due to fatigue.    Ambulation/Gait   General Gait Details: unable at this time.     Balance Overall balance assessment: Needs assistance Sitting-balance support: Feet supported Sitting balance-[name] Scale: Good Sitting balance - Comments: Pt sat Edge of recliner   Standing balance support: Bilateral upper extremity supported, During functional activity, Reliant on assistive device for balance Standing balance-[name] Scale: Zero Standing balance comment: unable to get to standing.     Communication Communication Communication: No apparent difficulties  Cognition Arousal: Alert Behavior During Therapy: WFL for tasks assessed/performed   PT - Cognitive impairments: No apparent impairments   Following commands: Intact      Cueing Cueing Techniques: Verbal cues     General Comments General comments (skin integrity, edema, etc.): Pt educated on use and how to don limb protector. Stated understanding.      Pertinent Vitals/Pain Pain Assessment Pain Assessment: 0-10 Pain Score: 8  Pain Location: L residual limb Pain Descriptors / Indicators: Aching, Operative site guarding Pain Intervention(s): Monitored during session, Limited activity within patient's tolerance    Home Living Family/patient expects to be discharged to:: Private residence Living Arrangements: Spouse/significant other Available Help at Discharge: Family;Available 24 hours/day Type of Home: House Home Access: Stairs to enter Entrance Stairs-Rails: Right;Left;Can  reach both Entrance Stairs-Number of Steps: 2   Home Layout: One  level Home Equipment: Agricultural consultant (2 wheels);Shower seat;BSC/3in1;Grab bars - toilet;Other (comment)          PT Goals (current goals can now be found in the care plan section) Acute Rehab PT Goals Patient Stated Goal: improve mobility PT Goal Formulation: With patient Time For Goal Achievement: 02/14/24 Potential to Achieve Goals: Fair Progress towards PT goals: Goals updated    Frequency    Min 3X/week      PT Plan  Continue with current POC        AM-PAC PT 6 Clicks Mobility   Outcome Measure  Help needed turning from your back to your side while in a flat bed without using bedrails?: A Little Help needed moving from lying on your back to sitting on the side of a flat bed without using bedrails?: A Little Help needed moving to and from a bed to a chair (including a wheelchair)?: A Lot Help needed standing up from a chair using your arms (e.g., wheelchair or bedside chair)?: Total Help needed to walk in hospital room?: Total Help needed climbing 3-5 steps with a railing? : Total 6 Click Score: 11    End of Session Equipment Utilized During Treatment: Gait belt Activity Tolerance: Patient tolerated treatment well;Patient limited by fatigue Patient left: with call bell/phone within reach;with chair alarm set;in chair Nurse Communication: Mobility status PT Visit Diagnosis: Muscle weakness (generalized) (M62.81);Difficulty in walking, not elsewhere classified (R26.2);Unsteadiness on feet (R26.81);Other abnormalities of gait and mobility (R26.89);Pain Pain - Right/Left: Left Pain - part of body: Leg     Time: 1459-1515 PT Time Calculation (min) (ACUTE ONLY): 16 min  Charges:      PT General Charges $$ ACUTE PT VISIT: 1 Visit                    Dorothyann Maier, DPT, CLT  Acute Rehabilitation Services Office: [phone] (Secure chat preferred)    Dorothyann VEAR Maier 01/31/2024, 3:35 PM

## 2024-01-31 NOTE — Progress Notes (Signed)
 Physical Therapy Treatment Patient Details Name: Phillip [name] MRN: [id number] DOB: 02/23/1956 Today's Date: 01/31/2024   History of Present Illness Phillip [name] is a 68 y.o. male admitted 01/14/24 for left third toe gangrene. Pt s/p left third toe amputation including met head 8/13 and left TMA with wound vac application 8/15. Currently s/p then s/p L BKA 01/30/24. TAVR on 8/19. PMHx: T1DM, PAD s/p angioplasty/PCI to left tibioperoneal trunk on 7/25, ESRD on HD TTS, HTN, chronic HFrEF, severe aortic stenosis, acute cholecystitis requiring cholecystostomy drain 6/25    PT Comments  Pt received in supine, NT requesting assist to reposition pt, pt agreeable to additional session for instruction on post-op LLE BKA precs, phantom pain mgmt, use of ice/frequency PRN, and LLE HEP. Pt needing up to CGA to long sitting and posterior scooting (limited) in long sit posture, and +2 minA for posterior supine scooting toward [facility] with overhead rail. Pt anxious and restless, given pillow to assist with pressure relief. Patient will benefit from intensive inpatient follow-up therapy, >3 hours/day.    If plan is discharge home, recommend the following: Assistance with cooking/housework;Assist for transportation;Help with stairs or ramp for entrance;A lot of help with walking and/or transfers   Can travel by private vehicle     No  Equipment Recommendations  Wheelchair cushion (measurements PT);Wheelchair (measurements PT);BSC/3in1;Other (comment) (with residual limb pad and possibly sliding board)    Recommendations for Other Services Rehab consult     Precautions / Restrictions Precautions Precautions: Fall;Other (comment) (amputee) Recall of Precautions/Restrictions: Intact Precaution/Restrictions Comments: R chole drain- abdomen Required Braces or Orthoses: Other Brace Other Brace: limb protector Restrictions Weight Bearing Restrictions Per Provider Order: Yes LLE Weight Bearing Per Provider  Order: Non weight bearing     Mobility  Bed Mobility Overal bed mobility: Needs Assistance Bed Mobility: Supine to Sit, Sit to Supine     Supine to sit: Contact guard, Supervision, HOB elevated, Used rails Sit to supine: Contact guard assist, HOB elevated, Used rails   General bed mobility comments: pt using bed rails to sit up in long sitting x3; +2 minA for posterior supine scoot to [facility] with pt able to use BUE on overhead rail to assist    Transfers Overall transfer level: Needs assistance             Anterior-Posterior transfers: Contact guard assist   General transfer comment: posterior scooting a couple reps in long sitting in bed, pt recently returned to bed so defers OOB    Ambulation/Gait               General Gait Details: unable at this time.   Stairs             Wheelchair Mobility     Tilt Bed    Modified Rankin (Stroke Patients Only)       Balance Overall balance assessment: Needs assistance Sitting-balance support: Feet supported Sitting balance-Leahy Scale: Good         Standing balance comment: defer                            Communication Communication Communication: No apparent difficulties  Cognition Arousal: Alert Behavior During Therapy: WFL for tasks assessed/performed, Restless   PT - Cognitive impairments: No apparent impairments                       PT - Cognition Comments: RN notified pt appears  more restless and c/o pain Following commands: Intact      Cueing Cueing Techniques: Verbal cues  Exercises Other Exercises Other Exercises: pt instructed on LLE AROM: knee flex/ext, resting L knee ext, SLR, hip abduction in supine 3x10 reps daily    General Comments General comments (skin integrity, edema, etc.): discussion on use of ice freq/technique 10-20 mins on only and also phantom pain mgmt/technique rubbing (gently)/tapping (lightly) as tolerated; pt states I need help; BP 118/63  HR 80's bpm just prior to pt scooting up in bed      Pertinent Vitals/Pain Pain Assessment Pain Assessment: 0-10 Pain Score: 8  Pain Location: L residual limb Pain Descriptors / Indicators: Aching, Operative site guarding, Grimacing, Moaning Pain Intervention(s): Limited activity within patient's tolerance, Monitored during session, Repositioned, Patient requesting pain meds-RN notified, Ice applied (ice placed under his L residual limb, pt encouraged to remove it in 10-20 mins or if it feels too cold.)    Home Living                          Prior Function            PT Goals (current goals can now be found in the care plan section) Acute Rehab PT Goals Patient Stated Goal: improve mobility PT Goal Formulation: With patient Time For Goal Achievement: 02/14/24 Potential to Achieve Goals: Fair Progress towards PT goals: Progressing toward goals    Frequency    Min 3X/week      PT Plan      Co-evaluation              AM-PAC PT 6 Clicks Mobility   Outcome Measure  Help needed turning from your back to your side while in a flat bed without using bedrails?: A Little Help needed moving from lying on your back to sitting on the side of a flat bed without using bedrails?: A Little Help needed moving to and from a bed to a chair (including a wheelchair)?: A Lot Help needed standing up from a chair using your arms (e.g., wheelchair or bedside chair)?: Total Help needed to walk in hospital room?: Total Help needed climbing 3-5 steps with a railing? : Total 6 Click Score: 11    End of Session Equipment Utilized During Treatment: Gait belt Activity Tolerance: Patient limited by pain Patient left: in bed;with call bell/phone within reach;with bed alarm set;Other (comment) (pillow behind R hip per patient request, LLE distally elevation for comfort with ice under distal limb as tolerated) Nurse Communication: Mobility status;Patient requests pain meds PT Visit  Diagnosis: Muscle weakness (generalized) (M62.81);Difficulty in walking, not elsewhere classified (R26.2);Unsteadiness on feet (R26.81);Other abnormalities of gait and mobility (R26.89);Pain Pain - Right/Left: Left Pain - part of body:  (limb)     Time: 8379-8367 PT Time Calculation (min) (ACUTE ONLY): 12 min  Charges:    $Therapeutic Activity: 8-22 mins PT General Charges $$ ACUTE PT VISIT: 1 Visit                     Camaron Cammack P., PTA Acute Rehabilitation Services Secure Chat Preferred 9a-5:30pm Office: [phone]    Connell CHRISTELLA Blue 01/31/2024, 5:33 PM

## 2024-02-01 DIAGNOSIS — I96 Gangrene, not elsewhere classified: Secondary | ICD-10-CM | POA: Diagnosis not present

## 2024-02-01 LAB — CBC
HCT: 27.9 % — ABNORMAL LOW (ref 39.0–52.0)
Hemoglobin: 8.8 g/dL — ABNORMAL LOW (ref 13.0–17.0)
MCH: 30.2 pg (ref 26.0–34.0)
MCHC: 31.5 g/dL (ref 30.0–36.0)
MCV: 95.9 fL (ref 80.0–100.0)
Platelets: 151 K/uL (ref 150–400)
RBC: 2.91 MIL/uL — ABNORMAL LOW (ref 4.22–5.81)
RDW: 14.9 % (ref 11.5–15.5)
WBC: 10.5 K/uL (ref 4.0–10.5)
nRBC: 0 % (ref 0.0–0.2)

## 2024-02-01 LAB — GLUCOSE, CAPILLARY
Glucose-Capillary: 123 mg/dL — ABNORMAL HIGH (ref 70–99)
Glucose-Capillary: 159 mg/dL — ABNORMAL HIGH (ref 70–99)
Glucose-Capillary: 149 mg/dL — ABNORMAL HIGH (ref 70–99)
Glucose-Capillary: 136 mg/dL — ABNORMAL HIGH (ref 70–99)

## 2024-02-01 LAB — BASIC METABOLIC PANEL WITH GFR
Anion gap: 12 (ref 5–15)
BUN: 26 mg/dL — ABNORMAL HIGH (ref 8–23)
CO2: 28 mmol/L (ref 22–32)
Calcium: 8.2 mg/dL — ABNORMAL LOW (ref 8.9–10.3)
Chloride: 92 mmol/L — ABNORMAL LOW (ref 98–111)
Creatinine, Ser: 4.8 mg/dL — ABNORMAL HIGH (ref 0.61–1.24)
GFR, Estimated: 12 mL/min — ABNORMAL LOW (ref >60)
Glucose, Bld: 119 mg/dL — ABNORMAL HIGH (ref 70–99)
Potassium: 3.9 mmol/L (ref 3.5–5.1)
Sodium: 132 mmol/L — ABNORMAL LOW (ref 135–145)

## 2024-02-01 MED ORDER — DARBEPOETIN ALFA 60 MCG/0.3ML IJ SOSY: 60.0000 ug | PREFILLED_SYRINGE | Freq: Once | INTRAMUSCULAR | Status: DC

## 2024-02-01 MED ORDER — ENOXAPARIN SODIUM 30 MG/0.3ML IJ SOSY
30.0000 mg | PREFILLED_SYRINGE | INTRAMUSCULAR | Status: DC
  Administered 2024-02-01 – 2024-02-03 (×3): 30 mg via SUBCUTANEOUS
  Filled 2024-02-01 (×3): qty 0.3

## 2024-02-01 MED ORDER — ENOXAPARIN SODIUM 30 MG/0.3ML IJ SOSY: 30.0000 mg | PREFILLED_SYRINGE | Freq: Every day | INTRAMUSCULAR | Status: DC

## 2024-02-01 MED ORDER — ORAL CARE MOUTH RINSE
15.0000 mL | OROMUCOSAL | Status: DC | PRN
Start: 2024-02-01 — End: 2024-02-05

## 2024-02-01 NOTE — Plan of Care (Signed)

## 2024-02-01 NOTE — Progress Notes (Addendum)
 [facility] Progress Note   Subjective:  Seen in room - sitting in chair, L stump remains painful. No CP/dyspnea. HD went well yesterday.  Objective Vitals:   02/01/24 0027 02/01/24 0353 02/01/24 0500 02/01/24 0816  BP: (!) 94/51 (!) 102/56  102/60  Pulse: 81 74    Resp: 16 18  13   Temp: 97.7 F (36.5 C) 98 F (36.7 C)  (!) 97.5 F (36.4 C)  TempSrc: Oral Oral  Oral  SpO2: 96% 99%    Weight:   73.2 kg   Height:       Physical Exam General: Well appearing man, NAD Heart: RRR Lungs: CTAB Extremities: No edema, L BKA Dialysis Access: LUE AVF +t/b  Additional Objective Labs: Basic Metabolic Panel: Recent Labs  Lab 01/28/24 0311 01/30/24 0708 01/30/24 0844 01/30/24 0917 01/31/24 0341 02/01/24 0342  NA 134* 135   < > 134* 132* 132*  K 4.2 4.3   < > 4.3 4.7 3.9  CL 93* 92*   < > 94* 92* 92*  CO2 25 27  --   --  22 28  GLUCOSE 99 111*   < > 114* 169* 119*  BUN 42* 33*   < > 34* 45* 26*  CREATININE 6.74* 6.20*   < > 5.90* 7.03* 4.80*  CALCIUM  8.6* 9.0  --   --  8.3* 8.2*  PHOS 4.9*  --   --   --   --   --    < > = values in this interval not displayed.   Liver Function Tests: Recent Labs  Lab 01/26/24 0328  AST 17  ALT <5  ALKPHOS 96  BILITOT 0.8  PROT 8.4*  ALBUMIN  2.4*   CBC: Recent Labs  Lab 01/27/24 0435 01/28/24 0311 01/30/24 0708 01/30/24 0844 01/30/24 0917 01/31/24 0341 02/01/24 0342  WBC 11.0* 11.0* 9.7  --   --  10.3 10.5  HGB 9.8* 9.6* 9.8*   < > 10.9* 8.5* 8.8*  HCT 30.3* 30.1* 32.1*   < > 32.0* 27.6* 27.9*  MCV 93.8 95.6 97.9  --   --  96.2 95.9  PLT 159 148* 173  --   --  139* 151   < > = values in this interval not displayed.   Medications:  sodium chloride      norepinephrine  (LEVOPHED ) Adult infusion      (feeding supplement) PROSource Plus  30 mL Oral BID BM   aspirin  EC  81 mg Oral Daily   atorvastatin   20 mg Oral Daily   carvedilol   3.125 mg Oral BID   clopidogrel   75 mg Oral Daily   enoxaparin  (LOVENOX )  injection  30 mg Subcutaneous Q24H   isosorbide  mononitrate  120 mg Oral Daily   levothyroxine   50 mcg Oral Daily   sevelamer  carbonate  800 mg Oral TID WC   sodium chloride  flush  3 mL Intravenous Q12H   tamsulosin   0.4 mg Oral QPC breakfast    Dialysis Orders TTS [facility] 4hr, 350/800, EDW 81kg, 2K/2.5Ca, L AVF, no heparin  - no VDRA, ESA ordered but not yet given  Assessment/Plan: 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 Per VVS, there are concerns for wound healing. Severe aortic stenosis s/p TAVR 01/20/24: Zio monitor in place. ESRD: Continue HD on TTS schedule - > next 9/2. HTN/volume: BP soft, on low dose BB and isosorbide . Below prior EDW - will lower on discharge. Anemia of  ESRD: Hgb 8.8 - will start ESA Secondary HPTH: Ca/Phos ok - continue sevelamer  as binder. Nutrition: Alb low, continue supps. Hx cholecystitis with perc chole drain still in place T2DM   Izetta Boehringer, PA-C 02/01/2024, 9:11 AM  [employer]

## 2024-02-01 NOTE — Progress Notes (Addendum)
  Progress Note    02/01/2024 7:51 AM 2 Days Post-Op  Subjective:  Sitting up in chair, left BKA sore   Vitals:   02/01/24 0027 02/01/24 0353  BP: (!) 94/51 (!) 102/56  Pulse: 81 74  Resp: 16 18  Temp: 97.7 F (36.5 C) 98 F (36.7 C)  SpO2: 96% 99%   Physical Exam: Cardiac:  regular Lungs:  non labored Incisions:  Left BKA stump intact, staples present, no drainage. Viable flaps. Kerlix and Ace reapplied  Neurologic: alert and oriented  CBC    Component Value Date/Time   WBC 10.5 02/01/2024 0342   RBC 2.91 (L) 02/01/2024 0342   HGB 8.8 (L) 02/01/2024 0342   HGB 11.5 (L) 05/16/2023 1429   HCT 27.9 (L) 02/01/2024 0342   HCT 35.3 (L) 05/16/2023 1429   PLT 151 02/01/2024 0342   PLT 144 (L) 05/16/2023 1429   MCV 95.9 02/01/2024 0342   MCV 90 05/16/2023 1429   MCH 30.2 02/01/2024 0342   MCHC 31.5 02/01/2024 0342   RDW 14.9 02/01/2024 0342   RDW 13.5 05/16/2023 1429   LYMPHSABS 0.7 11/11/2023 1930   LYMPHSABS 0.7 05/16/2023 1429   MONOABS 0.8 11/11/2023 1930   EOSABS 0.0 11/11/2023 1930   EOSABS 0.1 05/16/2023 1429   BASOSABS 0.0 11/11/2023 1930   BASOSABS 0.0 05/16/2023 1429    BMET    Component Value Date/Time   NA 132 (L) 02/01/2024 0342   NA 145 (H) 07/14/2023 1412   NA 136 08/30/2013 1025   K 3.9 02/01/2024 0342   K 3.7 08/30/2013 1025   CL 92 (L) 02/01/2024 0342   CL 101 08/30/2013 1025   CO2 28 02/01/2024 0342   CO2 31 08/30/2013 1025   GLUCOSE 119 (H) 02/01/2024 0342   GLUCOSE 310 (H) 08/30/2013 1025   BUN 26 (H) 02/01/2024 0342   BUN 97 (HH) 07/14/2023 1412   BUN 17 08/30/2013 1025   CREATININE 4.80 (H) 02/01/2024 0342   CREATININE 1.15 08/30/2013 1025   CALCIUM  8.2 (L) 02/01/2024 0342   CALCIUM  9.4 08/30/2013 1025   GFRNONAA 12 (L) 02/01/2024 0342   GFRNONAA >60 08/30/2013 1025   GFRAA 27 (L) 02/22/2020 0539   GFRAA >60 08/30/2013 1025    INR    Component Value Date/Time   INR 1.1 01/19/2024 1844     Intake/Output Summary (Last  24 hours) at 02/01/2024 0751 Last data filed at 01/31/2024 1216 Gross per 24 hour  Intake --  Output 1400 ml  Net -1400 ml     Assessment/Plan:  68 y.o. male is s/p left BKA 2 Days Post-Op   Left BKA well appearing, viable flaps, staples intact. Dressings replaced. Will work towards transitioning to compression stocking tomorrow Has Ampushield and stump compression stocking Pain control as needed PT/ OT recommending CIR He will have follow up arranged in 4 weeks for staple removal   [name], PA-C [facility] [phone] 02/01/2024 7:51 AM  VASCULAR STAFF ADDENDUM: I have independently interviewed and examined the patient. I agree with the above.   Debby SAILOR. Magda, MD [facility] Phone Number: [phone] 02/01/2024 1:14 PM

## 2024-02-01 NOTE — Progress Notes (Signed)
 [facility] PROGRESS NOTE    Progress Note  Phillip [name]  FMW:[id number] DOB: 07-27-1955 DOA: 01/14/2024 PCP: [name] LABOR., MD     Brief Narrative:   Phillip [name] is an 68 y.o. male past medical history of end-stage renal disease on hemodialysis Tuesday Thursdays and Saturdays, diabetes mellitus type 2, recent acute cholecystitis in June 2025 requiring colostomy tube placement, chronic HFrEF with last EF of 30% in 2025, aortic stenosis status post TAVR 8/19/ 2025, PAD status post PCI to the left tibia-fibula on 12/26/2023 admitted by vascular surgery on 01/14/2024 for elective left third toe amputation due to gangrenous left third toe underwent angiogram of the left lower extremity on 12/26/2023 at [facility], however due to underlying comorbidities he was transferred to [facility]  Significant Events: 8/13 - left 3rd toe amputation 8/15 - left TMA and wound vac placment 8/19 - TAVR 8/22 - wound vac removed   Assessment/Plan:   Gangrene of toe of left foot (HCC) Status post transmetatarsal amputation with wound VAC placement on 01/16/2024. Wound VAC removed on 01/23/2024. Vascular surgery discussed status post BKA on 01/30/2024.   Hemoglobin stable at 8.5.  Continue aspirin  and Plavix . Being evaluated by inpatient rehab for placement Further management per vascular surgery.  Severe aortic stenosis: Cardiology was consulted, status post TAVR's on 01/20/2024.  Chronic HFpEF:  Appears euvolemic continue volume per hemodialysis.  End-stage renal disease: Nephrology on board continue to dialyze Tuesday Thursdays and Saturdays.  CAD with CABG: Last cath on February 2025. Continue aspirin  and Plavix .  Recent history of acute cholecystitis requiring cholecystectomy: Drain placed on 11/21/2023. In place has remained afebrile with no leukocytosis.  Controlled diabetes mellitus type 2: Continue sliding scale insulin , last A1c of 5.8.  Hypothyroidism: Continue  Synthroid .  Goals of care: PT OT has been consulted, being evaluated for inpatient rehab.   DVT prophylaxis: lovenox  Family Communication:none Status is: Inpatient Remains inpatient appropriate because: Below the knee amputation    Code Status:     Code Status Orders  (From admission, onward)           Start     Ordered   01/14/24 1727  Full code  Continuous       Question:  By:  Answer:  Consent: discussion documented in EHR   01/14/24 1727           Code Status History     Date Active Date Inactive Code Status Order ID Comments User Context   12/26/2023 1322 12/26/2023 2204 Full Code [id number]  Marea Selinda RAMAN, MD Inpatient   11/12/2023 0613 11/14/2023 2013 Full Code [id number]  Alfornia Madison, MD Inpatient   08/12/2023 1025 08/25/2023 1852 Full Code [id number]  Claudene Maximino LABOR, MD ED   08/01/2023 1314 08/01/2023 2034 Full Code [id number]  Wendel Lurena POUR, MD Inpatient   09/30/2022 1727 09/30/2022 2326 Full Code [id number]  Marea Selinda RAMAN, MD Inpatient   11/06/2020 2006 11/10/2020 1950 Full Code [id number]  Juvenal Harlene PENNER, DO Inpatient   02/18/2020 2326 02/22/2020 1919 Full Code [id number]  Tobie Jorie SAUNDERS, MD ED   09/22/2018 1831 09/26/2018 1649 Full Code [id number]  Jordis Laneta FALCON, MD Inpatient   09/08/2018 1838 09/21/2018 2107 Full Code [id number]  Jordis Laneta FALCON, MD Inpatient   03/13/2018 1152 03/13/2018 1730 Full Code [id number]  Ashley Soulier, [facility] Inpatient   03/02/2018 1033 03/02/2018 1539 Full Code [id number]  Marea Selinda RAMAN, MD Inpatient         IV Access:  Peripheral IV   Procedures and diagnostic studies:   No results found.   Medical Consultants:   None.   Subjective:    TIMONTHY HOVATER pain is controlled.  Objective:    Vitals:   01/31/24 1617 02/01/24 0027 02/01/24 0353 02/01/24 0500  BP: 118/63 (!) 94/51 (!) 102/56   Pulse: 86 81 74   Resp: 13 16 18    Temp: 97.8 F (36.6 C) 97.7 F (36.5 C) 98 F (36.7 C)   TempSrc: Oral Oral Oral   SpO2: 100%  96% 99%   Weight:    73.2 kg  Height:       SpO2: 99 % O2 Flow Rate (L/min): 0 L/min   Intake/Output Summary (Last 24 hours) at 02/01/2024 0724 Last data filed at 01/31/2024 1216 Gross per 24 hour  Intake --  Output 1400 ml  Net -1400 ml    Filed Weights   01/31/24 0827 01/31/24 1216 02/01/24 0500  Weight: 73.1 kg 72.3 kg 73.2 kg    Exam: General exam: In no acute distress. Respiratory system: Good air movement and clear to auscultation. Cardiovascular system: S1 & S2 heard, RRR. No JVD. Gastrointestinal system: Abdomen is nondistended, soft and nontender.  Extremities: Left below the knee amputation Skin: No rashes, lesions or ulcers Psychiatry: Judgement and insight appear normal. Mood & affect appropriate. Data Reviewed:    Labs: Basic Metabolic Panel: Recent Labs  Lab 01/26/24 0328 01/27/24 0435 01/28/24 0311 01/30/24 0708 01/30/24 0844 01/30/24 0917 01/31/24 0341 02/01/24 0342  NA 134* 132* 134* 135 130* 134* 132* 132*  K 4.4 4.3 4.2 4.3 8.3* 4.3 4.7 3.9  CL 94* 92* 93* 92* 96* 94* 92* 92*  CO2 25 24 25 27   --   --  22 28  GLUCOSE 145* 112* 99 111* 115* 114* 169* 119*  BUN 64* 82* 42* 33* 52* 34* 45* 26*  CREATININE 9.00* 10.35* 6.74* 6.20* 6.10* 5.90* 7.03* 4.80*  CALCIUM  8.9 8.7* 8.6* 9.0  --   --  8.3* 8.2*  MG 2.3 2.3  --   --   --   --   --   --   PHOS  --   --  4.9*  --   --   --   --   --    GFR Estimated Creatinine Clearance: 15.3 mL/min (A) (by C-G formula based on SCr of 4.8 mg/dL (H)). Liver Function Tests: Recent Labs  Lab 01/26/24 0328  AST 17  ALT <5  ALKPHOS 96  BILITOT 0.8  PROT 8.4*  ALBUMIN  2.4*   No results for input(s): LIPASE, AMYLASE in the last 168 hours. No results for input(s): AMMONIA in the last 168 hours. Coagulation profile No results for input(s): INR, PROTIME in the last 168 hours. COVID-19 Labs  No results for input(s): DDIMER, FERRITIN, LDH, CRP in the last 72 hours.  Lab Results   Component Value Date   SARSCOV2NAA NEGATIVE 11/11/2023   SARSCOV2NAA NEGATIVE 11/06/2020   SARSCOV2NAA NEGATIVE 02/18/2020   SARSCOV2NAA NEGATIVE 10/20/2018    CBC: Recent Labs  Lab 01/27/24 0435 01/28/24 0311 01/30/24 0708 01/30/24 0844 01/30/24 0917 01/31/24 0341 02/01/24 0342  WBC 11.0* 11.0* 9.7  --   --  10.3 10.5  HGB 9.8* 9.6* 9.8* 11.2* 10.9* 8.5* 8.8*  HCT 30.3* 30.1* 32.1* 33.0* 32.0* 27.6* 27.9*  MCV 93.8 95.6 97.9  --   --  96.2 95.9  PLT 159 148* 173  --   --  139* 151  Cardiac Enzymes: No results for input(s): CKTOTAL, CKMB, CKMBINDEX, TROPONINI in the last 168 hours. BNP (last 3 results) Recent Labs    07/14/23 1412  PROBNP 11,269*   CBG: Recent Labs  Lab 01/30/24 2053 01/31/24 0622 01/31/24 1616 01/31/24 2123 02/01/24 0617  GLUCAP 158* 129* 147* 115* 123*   D-Dimer: No results for input(s): DDIMER in the last 72 hours. Hgb A1c: No results for input(s): HGBA1C in the last 72 hours. Lipid Profile: No results for input(s): CHOL, HDL, LDLCALC, TRIG, CHOLHDL, LDLDIRECT in the last 72 hours. Thyroid  function studies: No results for input(s): TSH, T4TOTAL, T3FREE, THYROIDAB in the last 72 hours.  Invalid input(s): FREET3 Anemia work up: No results for input(s): VITAMINB12, FOLATE, FERRITIN, TIBC, IRON , RETICCTPCT in the last 72 hours. Sepsis Labs: Recent Labs  Lab 01/28/24 0311 01/30/24 0708 01/31/24 0341 02/01/24 0342  WBC 11.0* 9.7 10.3 10.5   Microbiology No results found for this or any previous visit (from the past 240 hours).   Medications:    (feeding supplement) PROSource Plus  30 mL Oral BID BM   aspirin  EC  81 mg Oral Daily   atorvastatin   20 mg Oral Daily   carvedilol   3.125 mg Oral BID   clopidogrel   75 mg Oral Daily   heparin   5,000 Units Subcutaneous Q8H   isosorbide  mononitrate  120 mg Oral Daily   levothyroxine   50 mcg Oral Daily   sevelamer  carbonate  800 mg Oral TID  WC   sodium chloride  flush  3 mL Intravenous Q12H   tamsulosin   0.4 mg Oral QPC breakfast   Continuous Infusions:  sodium chloride      norepinephrine  (LEVOPHED ) Adult infusion        LOS: 18 days   Erle Odell Castor  [facility]  02/01/2024, 7:24 AM

## 2024-02-02 ENCOUNTER — Inpatient Hospital Stay (HOSPITAL_COMMUNITY)

## 2024-02-02 DIAGNOSIS — I96 Gangrene, not elsewhere classified: Secondary | ICD-10-CM | POA: Diagnosis not present

## 2024-02-02 LAB — BASIC METABOLIC PANEL WITH GFR
Anion gap: 16 — ABNORMAL HIGH (ref 5–15)
BUN: 41 mg/dL — ABNORMAL HIGH (ref 8–23)
CO2: 28 mmol/L (ref 22–32)
Calcium: 8.6 mg/dL — ABNORMAL LOW (ref 8.9–10.3)
Chloride: 92 mmol/L — ABNORMAL LOW (ref 98–111)
Creatinine, Ser: 6.87 mg/dL — ABNORMAL HIGH (ref 0.61–1.24)
GFR, Estimated: 8 mL/min — ABNORMAL LOW (ref >60)
Glucose, Bld: 122 mg/dL — ABNORMAL HIGH (ref 70–99)
Potassium: 4.3 mmol/L (ref 3.5–5.1)
Sodium: 136 mmol/L (ref 135–145)

## 2024-02-02 LAB — GLUCOSE, CAPILLARY
Glucose-Capillary: 108 mg/dL — ABNORMAL HIGH (ref 70–99)
Glucose-Capillary: 217 mg/dL — ABNORMAL HIGH (ref 70–99)
Glucose-Capillary: 104 mg/dL — ABNORMAL HIGH (ref 70–99)
Glucose-Capillary: 128 mg/dL — ABNORMAL HIGH (ref 70–99)

## 2024-02-02 MED ORDER — LINEZOLID 600 MG PO TABS
600.0000 mg | ORAL_TABLET | Freq: Two times a day (BID) | ORAL | Status: DC
  Administered 2024-02-02 – 2024-02-05 (×7): 600 mg via ORAL
  Filled 2024-02-02 (×8): qty 1

## 2024-02-02 MED ORDER — CLINDAMYCIN HCL 300 MG PO CAPS: 300.0000 mg | ORAL_CAPSULE | Freq: Three times a day (TID) | ORAL | Status: DC

## 2024-02-02 MED ORDER — IOHEXOL 350 MG/ML SOLN
75.0000 mL | Freq: Once | INTRAVENOUS | Status: AC | PRN
  Administered 2024-02-02: 75 mL via INTRAVENOUS

## 2024-02-02 MED ORDER — AMOXICILLIN-POT CLAVULANATE 500-125 MG PO TABS
1.0000 | ORAL_TABLET | Freq: Three times a day (TID) | ORAL | Status: DC
  Administered 2024-02-02 – 2024-02-03 (×3): 1 via ORAL
  Filled 2024-02-02 (×5): qty 1

## 2024-02-02 NOTE — Plan of Care (Signed)

## 2024-02-02 NOTE — Progress Notes (Addendum)
 [facility] PROGRESS NOTE    Progress Note  Phillip [name]  FMW:[id number] DOB: 19-Nov-1955 DOA: 01/14/2024 PCP: [name] LABOR., MD     Brief Narrative:   Phillip [name] is an 68 y.o. male past medical history of end-stage renal disease on hemodialysis Tuesday Thursdays and Saturdays, diabetes mellitus type 2, recent acute cholecystitis in June 2025 requiring colostomy tube placement, chronic HFrEF with last EF of 30% in 2025, aortic stenosis status post TAVR 8/19/ 2025, PAD status post PCI to the left tibia-fibula on 12/26/2023 admitted by vascular surgery on 01/14/2024 for elective left third toe amputation due to gangrenous left third toe underwent angiogram of the left lower extremity on 12/26/2023 at [facility], however due to underlying comorbidities he was transferred to [facility]  Significant Events: 8/13 - left 3rd toe amputation 8/15 - left TMA and wound vac placment 8/19 - TAVR 8/22 - wound vac removed.   Assessment/Plan:   Gangrene of toe of left foot (HCC) Status post transmetatarsal amputation with wound VAC placement on 01/16/2024. Wound VAC removed on 01/23/2024. Vascular surgery discussed status post BKA on 01/30/2024.   Hemoglobin stable at 8.5.  Continue aspirin  and Plavix . Being evaluated by inpatient rehab for placement Further management per vascular surgery.  Severe aortic stenosis: Cardiology was consulted, status post TAVR's on 01/20/2024.  Chronic HFpEF:  Appears euvolemic continue volume per hemodialysis.  End-stage renal disease: Nephrology on board continue to dialyze Tuesday Thursdays and Saturdays.  CAD with CABG: Last cath on February 2025. Continue aspirin  and Plavix .  Recent history of acute cholecystitis requiring cholecystectomy: Drain placed on 11/21/2023. Has remained afebrile with no leukocytosis, some erythema around the percutaneous biliary drain site with some purulent drainage soaking the gauze.  Will need to cover MRSA  and gram-negative's. Will go ahead and start him on Augmentin  and Linezolid .  Controlled diabetes mellitus type 2: Continue sliding scale insulin , last A1c of 5.8.  Hypothyroidism: Continue Synthroid .  Goals of care: PT OT has been consulted, being evaluated for inpatient rehab.   DVT prophylaxis: lovenox  Family Communication:none Status is: Inpatient Remains inpatient appropriate because: Below the knee amputation    Code Status:     Code Status Orders  (From admission, onward)           Start     Ordered   01/14/24 1727  Full code  Continuous       Question:  By:  Answer:  Consent: discussion documented in EHR   01/14/24 1727           Code Status History     Date Active Date Inactive Code Status Order ID Comments User Context   12/26/2023 1322 12/26/2023 2204 Full Code [id number]  Marea Selinda RAMAN, MD Inpatient   11/12/2023 0613 11/14/2023 2013 Full Code [id number]  Alfornia Madison, MD Inpatient   08/12/2023 1025 08/25/2023 1852 Full Code [id number]  Claudene Maximino LABOR, MD ED   08/01/2023 1314 08/01/2023 2034 Full Code [id number]  Wendel Lurena POUR, MD Inpatient   09/30/2022 1727 09/30/2022 2326 Full Code [id number]  Marea Selinda RAMAN, MD Inpatient   11/06/2020 2006 11/10/2020 1950 Full Code [id number]  Juvenal Harlene PENNER, DO Inpatient   02/18/2020 2326 02/22/2020 1919 Full Code [id number]  Tobie Jorie SAUNDERS, MD ED   09/22/2018 1831 09/26/2018 1649 Full Code [id number]  Jordis Laneta FALCON, MD Inpatient   09/08/2018 1838 09/21/2018 2107 Full Code [id number]  Jordis Laneta FALCON, MD Inpatient   03/13/2018 1152 03/13/2018 1730 Full Code  [id number]  Ashley Soulier, [facility] Inpatient   03/02/2018 1033 03/02/2018 1539 Full Code [id number]  Marea Selinda RAMAN, MD Inpatient         IV Access:   Peripheral IV   Procedures and diagnostic studies:   No results found.   Medical Consultants:   None.   Subjective:    Phillip [name] pain is controlled.  Objective:    Vitals:   02/01/24 1655 02/01/24 1700  02/01/24 2100 02/02/24 0500  BP:  (!) 94/46 (!) 96/54   Pulse: 100  99   Resp: 17 (!) 24 15   Temp: 98.1 F (36.7 C)  98.2 F (36.8 C)   TempSrc: Oral  Oral   SpO2:      Weight:    72.8 kg  Height:       SpO2: 99 % O2 Flow Rate (L/min): 0 L/min  No intake or output data in the 24 hours ending 02/02/24 0811   Filed Weights   01/31/24 1216 02/01/24 0500 02/02/24 0500  Weight: 72.3 kg 73.2 kg 72.8 kg    Exam: General exam: In no acute distress. Respiratory system: Good air movement and clear to auscultation. Cardiovascular system: S1 & S2 heard, RRR. No JVD. Gastrointestinal system: Abdomen is nondistended, soft and nontender.  Extremities: No pedal edema. Skin: No rashes, lesions or ulcers Psychiatry: Judgement and insight appear normal. Mood & affect appropriate. Data Reviewed:    Labs: Basic Metabolic Panel: Recent Labs  Lab 01/27/24 0435 01/28/24 0311 01/30/24 0708 01/30/24 0844 01/30/24 0917 01/31/24 0341 02/01/24 0342 02/02/24 0429  NA 132* 134* 135 130* 134* 132* 132* 136  K 4.3 4.2 4.3 8.3* 4.3 4.7 3.9 4.3  CL 92* 93* 92* 96* 94* 92* 92* 92*  CO2 24 25 27   --   --  22 28 28   GLUCOSE 112* 99 111* 115* 114* 169* 119* 122*  BUN 82* 42* 33* 52* 34* 45* 26* 41*  CREATININE 10.35* 6.74* 6.20* 6.10* 5.90* 7.03* 4.80* 6.87*  CALCIUM  8.7* 8.6* 9.0  --   --  8.3* 8.2* 8.6*  MG 2.3  --   --   --   --   --   --   --   PHOS  --  4.9*  --   --   --   --   --   --    GFR Estimated Creatinine Clearance: 10.6 mL/min (A) (by C-G formula based on SCr of 6.87 mg/dL (H)). Liver Function Tests: No results for input(s): AST, ALT, ALKPHOS, BILITOT, PROT, ALBUMIN  in the last 168 hours.  No results for input(s): LIPASE, AMYLASE in the last 168 hours. No results for input(s): AMMONIA in the last 168 hours. Coagulation profile No results for input(s): INR, PROTIME in the last 168 hours. COVID-19 Labs  No results for input(s): DDIMER, FERRITIN,  LDH, CRP in the last 72 hours.  Lab Results  Component Value Date   SARSCOV2NAA NEGATIVE 11/11/2023   SARSCOV2NAA NEGATIVE 11/06/2020   SARSCOV2NAA NEGATIVE 02/18/2020   SARSCOV2NAA NEGATIVE 10/20/2018    CBC: Recent Labs  Lab 01/27/24 0435 01/28/24 0311 01/30/24 0708 01/30/24 0844 01/30/24 0917 01/31/24 0341 02/01/24 0342  WBC 11.0* 11.0* 9.7  --   --  10.3 10.5  HGB 9.8* 9.6* 9.8* 11.2* 10.9* 8.5* 8.8*  HCT 30.3* 30.1* 32.1* 33.0* 32.0* 27.6* 27.9*  MCV 93.8 95.6 97.9  --   --  96.2 95.9  PLT 159 148* 173  --   --  139*  151   Cardiac Enzymes: No results for input(s): CKTOTAL, CKMB, CKMBINDEX, TROPONINI in the last 168 hours. BNP (last 3 results) Recent Labs    07/14/23 1412  PROBNP 11,269*   CBG: Recent Labs  Lab 02/01/24 0617 02/01/24 1131 02/01/24 1655 02/01/24 2105 02/02/24 0601  GLUCAP 123* 159* 149* 136* 108*   D-Dimer: No results for input(s): DDIMER in the last 72 hours. Hgb A1c: No results for input(s): HGBA1C in the last 72 hours. Lipid Profile: No results for input(s): CHOL, HDL, LDLCALC, TRIG, CHOLHDL, LDLDIRECT in the last 72 hours. Thyroid  function studies: No results for input(s): TSH, T4TOTAL, T3FREE, THYROIDAB in the last 72 hours.  Invalid input(s): FREET3 Anemia work up: No results for input(s): VITAMINB12, FOLATE, FERRITIN, TIBC, IRON , RETICCTPCT in the last 72 hours. Sepsis Labs: Recent Labs  Lab 01/28/24 0311 01/30/24 0708 01/31/24 0341 02/01/24 0342  WBC 11.0* 9.7 10.3 10.5   Microbiology No results found for this or any previous visit (from the past 240 hours).   Medications:    (feeding supplement) PROSource Plus  30 mL Oral BID BM   aspirin  EC  81 mg Oral Daily   atorvastatin   20 mg Oral Daily   carvedilol   3.125 mg Oral BID   clopidogrel   75 mg Oral Daily   [START ON 02/06/2024] darbepoetin (ARANESP ) injection - DIALYSIS  60 mcg Subcutaneous Once   enoxaparin   (LOVENOX ) injection  30 mg Subcutaneous Q24H   isosorbide  mononitrate  120 mg Oral Daily   levothyroxine   50 mcg Oral Daily   sevelamer  carbonate  800 mg Oral TID WC   sodium chloride  flush  3 mL Intravenous Q12H   tamsulosin   0.4 mg Oral QPC breakfast   Continuous Infusions:  sodium chloride      norepinephrine  (LEVOPHED ) Adult infusion        LOS: 19 days   Erle Odell Castor  [facility]  02/02/2024, 8:11 AM

## 2024-02-02 NOTE — Progress Notes (Signed)
 [facility] Progress Note   Subjective:  Seen in room. Ongoing L stump pain, but otherwise ok. No CP/dyspnea. For HD tomorrow.  Objective Vitals:   02/01/24 1700 02/01/24 2100 02/02/24 0500 02/02/24 0832  BP: (!) 94/46 (!) 96/54  108/69  Pulse:  99    Resp: (!) 24 15  15   Temp:  98.2 F (36.8 C)  97.8 F (36.6 C)  TempSrc:  Oral  Oral  SpO2:      Weight:   72.8 kg   Height:       Physical Exam General: Well appearing man, NAD Heart: RRR Lungs: CTAB Extremities: No edema, L BKA bandaged Dialysis Access: LUE AVF +t/b  Additional Objective Labs: Basic Metabolic Panel: Recent Labs  Lab 01/28/24 0311 01/30/24 0708 01/31/24 0341 02/01/24 0342 02/02/24 0429  NA 134*   < > 132* 132* 136  K 4.2   < > 4.7 3.9 4.3  CL 93*   < > 92* 92* 92*  CO2 25   < > 22 28 28   GLUCOSE 99   < > 169* 119* 122*  BUN 42*   < > 45* 26* 41*  CREATININE 6.74*   < > 7.03* 4.80* 6.87*  CALCIUM  8.6*   < > 8.3* 8.2* 8.6*  PHOS 4.9*  --   --   --   --    < > = values in this interval not displayed.   CBC: Recent Labs  Lab 01/27/24 0435 01/28/24 0311 01/30/24 0708 01/30/24 0844 01/30/24 0917 01/31/24 0341 02/01/24 0342  WBC 11.0* 11.0* 9.7  --   --  10.3 10.5  HGB 9.8* 9.6* 9.8*   < > 10.9* 8.5* 8.8*  HCT 30.3* 30.1* 32.1*   < > 32.0* 27.6* 27.9*  MCV 93.8 95.6 97.9  --   --  96.2 95.9  PLT 159 148* 173  --   --  139* 151   < > = values in this interval not displayed.   Medications:  sodium chloride       (feeding supplement) PROSource Plus  30 mL Oral BID BM   amoxicillin -clavulanate  1 tablet Oral Q8H   aspirin  EC  81 mg Oral Daily   atorvastatin   20 mg Oral Daily   carvedilol   3.125 mg Oral BID   clopidogrel   75 mg Oral Daily   [START ON 02/06/2024] darbepoetin (ARANESP ) injection - DIALYSIS  60 mcg Subcutaneous Once   enoxaparin  (LOVENOX ) injection  30 mg Subcutaneous Q24H   isosorbide  mononitrate  120 mg Oral Daily   levothyroxine   50 mcg Oral Daily    linezolid   600 mg Oral Q12H   sevelamer  carbonate  800 mg Oral TID WC   sodium chloride  flush  3 mL Intravenous Q12H   tamsulosin   0.4 mg Oral QPC breakfast    Dialysis Orders TTS [facility] 4hr, 350/800, EDW 81kg, 2K/2.5Ca, L AVF, no heparin  - no VDRA, ESA ordered but not yet given   Assessment/Plan: 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 Per VVS, there are concerns for wound healing. Severe aortic stenosis s/p TAVR 01/20/24: Zio monitor in place. ESRD: Continue HD on TTS schedule - > next 9/2. HTN/volume: BP soft, on low dose BB and isosorbide . Below prior EDW - will lower on discharge. Anemia of ESRD: Hgb 8.8 - ESA ordered (will start 9/5) Secondary HPTH: Ca/Phos ok - continue sevelamer  as binder. Nutrition: Alb low, continue supps. Hx  cholecystitis with perc chole drain still in place: Erythema around drain, on PO abx. T2DM Dispo: Likely needs rehab   Phillip Boehringer, PA-C 02/02/2024, 9:29 AM  [employer]

## 2024-02-02 NOTE — Progress Notes (Addendum)
 Patient ID: Phillip [name], male   DOB: [birth year], 68 y.o.   MRN: [id number]  IR received consult regarding possible cholecystostomy tube exchange given new purulence at tube site, first noticed today.  Case reviewed with IR attending Dr. Philip. Plan to obtained CT abd pelvis with contrast to rule out abscess. Ok for contrast given on HD and going tomorrow. If no indication of abscess can schedule for chole drain exchange this week.    Phillip [name] DEL Erricka Falkner PA-C 02/02/2024 11:47 AM

## 2024-02-02 NOTE — Progress Notes (Addendum)
 Inpatient Rehab Admissions Coordinator:   Stopped by to see pt and discuss rehab recommendations remain for CIR. He is in agreement to proceed with insurance auth request.  [facility] is closed today for the holiday so I will f/u tomorrow.  Will need updated PT/OT notes to proceed and looks like they're scheduled to both see him today.    Reche Lowers, PT, DPT Admissions Coordinator [phone] 02/02/24  1:03 PM

## 2024-02-02 NOTE — Progress Notes (Signed)
 OT Cancellation Note  Patient Details Name: MATTEUS MCNELLY MRN: [id number] DOB: 08/29/1955   Cancelled Treatment:    Reason Eval/Treat Not Completed: Other (comment) (OT arriving to room talking to wife, waking up pt and IV team arriving. pt needs IV for pending CT scan. OT to check back as time allows)  Ely Molt 02/02/2024, 2:00 PM

## 2024-02-02 NOTE — Progress Notes (Signed)
 Patient chole tube dressing changed, the skin at the site of insertion is edematous and friable, purulent discharge seeps from the insertion cite.  Cleansed and redressed, Dr Odell Castor made aware for evaluation.

## 2024-02-02 NOTE — Progress Notes (Signed)
 Mobility Specialist Progress Note:   02/02/24 1030  Mobility  Activity Pivoted/transferred from bed to chair  Level of Assistance Minimal assist, patient does 75% or more  Assistive Device None  Distance Ambulated (ft)  (lateral scoot- drop arm recliner)  Activity Response Tolerated well  Mobility Referral Yes  Mobility visit 1 Mobility  Mobility Specialist Start Time (ACUTE ONLY) 1030  Mobility Specialist Stop Time (ACUTE ONLY) 1045  Mobility Specialist Time Calculation (min) (ACUTE ONLY) 15 min   Pt agreeable to mobility session. Agreed to lateral scoot transfer to chair. Required only minA and max verbal cues to perform. Able to use RLE to assist with scooting. C/o pain in LLE with movement. Pt left with all needs met, alarm on.   Therisa Rana Mobility Specialist Please contact via SecureChat or  Rehab office at [phone]

## 2024-02-02 NOTE — Progress Notes (Signed)
 Physical Therapy Treatment Patient Details Name: Phillip [name] MRN: [id number] DOB: [birth year] Today's Date: 02/02/2024   History of Present Illness Phillip [name] is a 68 y.o. male admitted 01/14/24 for left third toe gangrene. Pt s/p left third toe amputation including met head 8/13 and left TMA with wound vac application 8/15. Pt s/p L BKA 01/30/24. Plan CT abdomen 9/1 due to increased purulence around R chole drain. TAVR on 8/19. PMHx: T1DM, PAD s/p angioplasty/PCI to left tibioperoneal trunk on 7/25, ESRD on HD TTS, HTN, chronic HFrEF, severe aortic stenosis, acute cholecystitis requiring cholecystostomy drain 6/25.    PT Comments  Pt received in recliner, agreeable to therapy session, c/o moderate to severe LLE pain and some R flank pain around chole drain site, RN notified per pt request. Pt needing up to heavy minA for lateral scoot from chair to bed, with totalA to don L limb guard prior to transfer. Pt given HEP handout which includes education on post-op BKA phantom limb pain, positioning, signs of infection, limb wrapping and other resources, family x2 arriving just after session ended. Pt with good following of instruction for LE HEP but needing multimodal cues and some slow processing, pt distracted due to pain. Patient will benefit from intensive inpatient follow-up therapy, >3 hours/day.   If plan is discharge home, recommend the following: Assistance with cooking/housework;Assist for transportation;Help with stairs or ramp for entrance;A lot of help with walking and/or transfers   Can travel by private vehicle     No  Equipment Recommendations  Wheelchair cushion (measurements PT);Wheelchair (measurements PT);BSC/3in1;Other (comment) (with residual limb pad and possibly sliding board)    Recommendations for Other Services Rehab consult     Precautions / Restrictions Precautions Precautions: Fall;Other (comment) (amputee) Recall of Precautions/Restrictions:  Intact Precaution/Restrictions Comments: R chole drain- abdomen Required Braces or Orthoses: Other Brace Other Brace: limb protector, needs to don for OOB Restrictions Weight Bearing Restrictions Per Provider Order: Yes LLE Weight Bearing Per Provider Order: Non weight bearing Other Position/Activity Restrictions: limb guard when transferring OOB or to [facility]     Mobility  Bed Mobility Overal bed mobility: Needs Assistance Bed Mobility: Sit to Supine       Sit to supine: Contact guard assist, Used rails   General bed mobility comments: cues for safety, CGA for BLE return to supine, use of rails.    Transfers Overall transfer level: Needs assistance Equipment used: None (bed pad assist) Transfers: Bed to chair/wheelchair/BSC            Lateral/Scoot Transfers: Min assist General transfer comment: cushioned chair>slightly higher EOB, heavy minA with mod cues for sequencing, UE placement and safety. Prior to scooting, had him perform chair push-ups x5 with pt not able to fully extend his elbows due to weakness. Cues for RLE placement as he scoots for improved activation and improved trunk flexion.  Scoot toward bed on his L with limb guard donned for safety prior to scooting. Left limb guard on with pillow under distal end of LLE limb due to pt tending to hold knee in flexion at rest/to encourage extension, pt/RN notified he can have staff help him remove it in ~30 mins or more once he gets more used to L knee extension at rest.    Ambulation/Gait               General Gait Details: unable at this time.   Stairs             Psychologist, prison and probation services  Tilt Bed    Modified Rankin (Stroke Patients Only)       Balance Overall balance assessment: Needs assistance Sitting-balance support: Feet supported Sitting balance-Leahy Scale: Good         Standing balance comment: defer                            Communication Communication Communication:  No apparent difficulties  Cognition Arousal: Alert Behavior During Therapy: WFL for tasks assessed/performed   PT - Cognitive impairments: Memory, Sequencing, No family/caregiver present to determine baseline, Attention                       PT - Cognition Comments: Pt with some difficulty recalling therapist name but appropriately asking for repeat of name when he forgets; needs some cues for attention to task (distracted due to pain) and review of sequencing when transferring with some slow processing; unsure of baseline cognition and family not present to confirm. Following commands: Intact      Cueing Cueing Techniques: Verbal cues, Gestural cues  Exercises General Exercises - Lower Extremity Quad Sets: AROM, Left, 5 reps, Supine Long Arc Quad: AROM, Both, 10 reps, Seated (pt not fully extending either knee but better extension on RLE.) Other Exercises Other Exercises: pt instructed on LLE AROM: knee flex/ext, resting L knee ext, SLR, hip abduction in supine 3x10 reps daily Other Exercises: chair push-ups x5 reps prior to lateral scooting back to bed Other Exercises: RN notified he needs drop arm BSC for his room if secretary can order one    General Comments General comments (skin integrity, edema, etc.): R flank chole drain with pus around dressing site, RN/MD aware. HR/BP/SpO2 WFL on RA.      Pertinent Vitals/Pain Pain Assessment Pain Assessment: 0-10 Pain Score: 7  Faces Pain Scale: Hurts whole lot Pain Location: L residual limb and a bit around R chole drain Pain Descriptors / Indicators: Aching, Operative site guarding, Grimacing, Moaning, Guarding, Sore, Throbbing Pain Intervention(s): Limited activity within patient's tolerance, Monitored during session, Repositioned, Patient requesting pain meds-RN notified, Other (comment) (pt defers ice pack today)    Home Living                          Prior Function            PT Goals (current goals can  now be found in the care plan section) Acute Rehab PT Goals Patient Stated Goal: Improve mobility PT Goal Formulation: With patient Time For Goal Achievement: 02/14/24 Progress towards PT goals: Progressing toward goals    Frequency    Min 3X/week      PT Plan      Co-evaluation              AM-PAC PT 6 Clicks Mobility   Outcome Measure  Help needed turning from your back to your side while in a flat bed without using bedrails?: A Little Help needed moving from lying on your back to sitting on the side of a flat bed without using bedrails?: A Little Help needed moving to and from a bed to a chair (including a wheelchair)?: A Lot (mod sequencing cues to scoot) Help needed standing up from a chair using your arms (e.g., wheelchair or bedside chair)?: Total Help needed to walk in hospital room?: Total Help needed climbing 3-5 steps with a railing? : Total 6 Click Score:  11    End of Session Equipment Utilized During Treatment: Other (comment) (bed pad assist; L limb guard donned prior to transfer) Activity Tolerance: Patient limited by pain;Patient tolerated treatment well Patient left: in bed;with call bell/phone within reach;with bed alarm set;Other (comment) (pillow to elevate distal end of LLE, limb guard donned to promote extension (RN/pt aware this can be removed when he is ready to). HOB > 30 deg for improved pulm clearance) Nurse Communication: Mobility status;Patient requests pain meds;Precautions PT Visit Diagnosis: Muscle weakness (generalized) (M62.81);Difficulty in walking, not elsewhere classified (R26.2);Unsteadiness on feet (R26.81);Other abnormalities of gait and mobility (R26.89);Pain Pain - Right/Left: Left Pain - part of body:  (limb)     Time: 8793-8765 PT Time Calculation (min) (ACUTE ONLY): 28 min  Charges:    $Therapeutic Exercise: 8-22 mins $Therapeutic Activity: 8-22 mins PT General Charges $$ ACUTE PT VISIT: 1 Visit                      Aundrea Higginbotham P., PTA Acute Rehabilitation Services Secure Chat Preferred 9a-5:30pm Office: [phone]    Phillip [name] [facility] 02/02/2024, 12:58 PM

## 2024-02-03 ENCOUNTER — Encounter (HOSPITAL_COMMUNITY): Payer: Self-pay | Admitting: Surgery

## 2024-02-03 ENCOUNTER — Inpatient Hospital Stay (HOSPITAL_COMMUNITY)

## 2024-02-03 DIAGNOSIS — I96 Gangrene, not elsewhere classified: Secondary | ICD-10-CM | POA: Diagnosis not present

## 2024-02-03 DIAGNOSIS — Z89512 Acquired absence of left leg below knee: Secondary | ICD-10-CM

## 2024-02-03 HISTORY — PX: IR EXCHANGE BILIARY DRAIN: IMG6046

## 2024-02-03 LAB — BPAM RBC
Blood Product Expiration Date: 202509172359
Blood Product Expiration Date: 202509182359
ISSUE DATE / TIME: 202508211633
Unit Type and Rh: 5100
Unit Type and Rh: 5100

## 2024-02-03 LAB — CBC
HCT: 25.7 % — ABNORMAL LOW (ref 39.0–52.0)
Hemoglobin: 7.9 g/dL — ABNORMAL LOW (ref 13.0–17.0)
MCH: 29.6 pg (ref 26.0–34.0)
MCHC: 30.7 g/dL (ref 30.0–36.0)
MCV: 96.3 fL (ref 80.0–100.0)
Platelets: 144 K/uL — ABNORMAL LOW (ref 150–400)
RBC: 2.67 MIL/uL — ABNORMAL LOW (ref 4.22–5.81)
RDW: 14.9 % (ref 11.5–15.5)
WBC: 8.9 K/uL (ref 4.0–10.5)
nRBC: 0 % (ref 0.0–0.2)

## 2024-02-03 LAB — TYPE AND SCREEN
ABO/RH(D): O POS
Antibody Screen: NEGATIVE
Unit division: 0
Unit division: 0

## 2024-02-03 LAB — RENAL FUNCTION PANEL
Albumin: 2.1 g/dL — ABNORMAL LOW (ref 3.5–5.0)
Anion gap: 17 — ABNORMAL HIGH (ref 5–15)
BUN: 62 mg/dL — ABNORMAL HIGH (ref 8–23)
CO2: 22 mmol/L (ref 22–32)
Calcium: 8.1 mg/dL — ABNORMAL LOW (ref 8.9–10.3)
Chloride: 89 mmol/L — ABNORMAL LOW (ref 98–111)
Creatinine, Ser: 9.08 mg/dL — ABNORMAL HIGH (ref 0.61–1.24)
GFR, Estimated: 6 mL/min — ABNORMAL LOW (ref >60)
Glucose, Bld: 137 mg/dL — ABNORMAL HIGH (ref 70–99)
Phosphorus: 4.4 mg/dL (ref 2.5–4.6)
Potassium: 4.6 mmol/L (ref 3.5–5.1)
Sodium: 128 mmol/L — ABNORMAL LOW (ref 135–145)

## 2024-02-03 LAB — GLUCOSE, CAPILLARY
Glucose-Capillary: 90 mg/dL (ref 70–99)
Glucose-Capillary: 162 mg/dL — ABNORMAL HIGH (ref 70–99)
Glucose-Capillary: 108 mg/dL — ABNORMAL HIGH (ref 70–99)

## 2024-02-03 MED ORDER — LIDOCAINE-PRILOCAINE 2.5-2.5 % EX CREA: 1.0000 | TOPICAL_CREAM | CUTANEOUS | Status: DC | PRN

## 2024-02-03 MED ORDER — LIDOCAINE-EPINEPHRINE 1 %-1:100000 IJ SOLN
INTRAMUSCULAR | Status: AC
  Filled 2024-02-03: qty 1

## 2024-02-03 MED ORDER — LIDOCAINE-EPINEPHRINE (PF) 1 %-1:200000 IJ SOLN
10.0000 mL | Freq: Once | INTRAMUSCULAR | Status: AC
  Administered 2024-02-03: 10 mL

## 2024-02-03 MED ORDER — IOHEXOL 300 MG/ML  SOLN
50.0000 mL | Freq: Once | INTRAMUSCULAR | Status: AC | PRN
  Administered 2024-02-03: 20 mL

## 2024-02-03 MED ORDER — PENTAFLUOROPROP-TETRAFLUOROETH EX AERO: 1.0000 | INHALATION_SPRAY | CUTANEOUS | Status: DC | PRN

## 2024-02-03 MED ORDER — LIDOCAINE HCL (PF) 1 % IJ SOLN: 5.0000 mL | INTRAMUSCULAR | Status: DC | PRN

## 2024-02-03 MED ORDER — AMOXICILLIN-POT CLAVULANATE 500-125 MG PO TABS
1.0000 | ORAL_TABLET | Freq: Two times a day (BID) | ORAL | Status: DC
  Administered 2024-02-03 – 2024-02-05 (×4): 1 via ORAL
  Filled 2024-02-03 (×5): qty 1

## 2024-02-03 MED ORDER — HEPARIN SODIUM (PORCINE) 1000 UNIT/ML DIALYSIS: 1000.0000 [IU] | INTRAMUSCULAR | Status: DC | PRN

## 2024-02-03 NOTE — Progress Notes (Signed)
  Progress Note    02/03/2024 7:24 AM 4 Days Post-Op  Subjective:  left BKA sore   Vitals:   02/03/24 0335 02/03/24 0606  BP: 106/76   Pulse: 78 79  Resp: 18 13  Temp: 98.9 F (37.2 C)   SpO2: 100% 100%   Physical Exam: Cardiac:  regular Lungs:  non labored  Incisions: left BKA healing well,staples intact. Dressings taken down. No drainage. placed stump compression stocking Neurologic: alert and oriented   CBC    Component Value Date/Time   WBC 10.5 02/01/2024 0342   RBC 2.91 (L) 02/01/2024 0342   HGB 8.8 (L) 02/01/2024 0342   HGB 11.5 (L) 05/16/2023 1429   HCT 27.9 (L) 02/01/2024 0342   HCT 35.3 (L) 05/16/2023 1429   PLT 151 02/01/2024 0342   PLT 144 (L) 05/16/2023 1429   MCV 95.9 02/01/2024 0342   MCV 90 05/16/2023 1429   MCH 30.2 02/01/2024 0342   MCHC 31.5 02/01/2024 0342   RDW 14.9 02/01/2024 0342   RDW 13.5 05/16/2023 1429   LYMPHSABS 0.7 11/11/2023 1930   LYMPHSABS 0.7 05/16/2023 1429   MONOABS 0.8 11/11/2023 1930   EOSABS 0.0 11/11/2023 1930   EOSABS 0.1 05/16/2023 1429   BASOSABS 0.0 11/11/2023 1930   BASOSABS 0.0 05/16/2023 1429    BMET    Component Value Date/Time   NA 136 02/02/2024 0429   NA 145 (H) 07/14/2023 1412   NA 136 08/30/2013 1025   K 4.3 02/02/2024 0429   K 3.7 08/30/2013 1025   CL 92 (L) 02/02/2024 0429   CL 101 08/30/2013 1025   CO2 28 02/02/2024 0429   CO2 31 08/30/2013 1025   GLUCOSE 122 (H) 02/02/2024 0429   GLUCOSE 310 (H) 08/30/2013 1025   BUN 41 (H) 02/02/2024 0429   BUN 97 (HH) 07/14/2023 1412   BUN 17 08/30/2013 1025   CREATININE 6.87 (H) 02/02/2024 0429   CREATININE 1.15 08/30/2013 1025   CALCIUM  8.6 (L) 02/02/2024 0429   CALCIUM  9.4 08/30/2013 1025   GFRNONAA 8 (L) 02/02/2024 0429   GFRNONAA >60 08/30/2013 1025   GFRAA 27 (L) 02/22/2020 0539   GFRAA >60 08/30/2013 1025    INR    Component Value Date/Time   INR 1.1 01/19/2024 1844     Intake/Output Summary (Last 24 hours) at 02/03/2024 0724 Last data  filed at 02/02/2024 1400 Gross per 24 hour  Intake 600 ml  Output --  Net 600 ml     Assessment/Plan:  68 y.o. male is s/p left BKA 4 Days Post-Op   Left BKA intact with staples. Healthy appearing Pain control PRN Discussed importance of being able to flex and extend knee Continue PT/ OT therapy Hopefully he can progress to CIR  Will arrange follow up in 4 weeks for staple removal   [name], PA-C [facility] [phone] 02/03/2024 7:24 AM

## 2024-02-03 NOTE — Progress Notes (Signed)
 Mobility Specialist Progress Note:   02/03/24 0932  Mobility  Activity Pivoted/transferred from bed to chair;Stood at bedside  Level of Assistance Moderate assist, patient does 50-74%  Assistive Device Front wheel walker  Activity Response Tolerated well  Mobility Referral Yes  Mobility visit 1 Mobility  Mobility Specialist Start Time (ACUTE ONLY) 0932  Mobility Specialist Stop Time (ACUTE ONLY) 0947  Mobility Specialist Time Calculation (min) (ACUTE ONLY) 15 min   Pt received dangling EOB, after bath, pt agreeable to transfer to bed. Completed one STS with ModA+2 and RW. Tolerated well, fatigued quickly. Pt transferred to chair via lateral scoot and MinA+2. Pt sitting up in chair with all needs met, call bell in reach. Left in care of MD in room.    Tradarius Reinwald Mobility Specialist Please contact via Special educational needs teacher or  Rehab office at [phone]

## 2024-02-03 NOTE — Progress Notes (Signed)
 [facility] PROGRESS NOTE    Progress Note  Phillip [name]  FMW:[id number] DOB: 1955-12-06 DOA: 01/14/2024 PCP: [name] LABOR., MD     Brief Narrative:   Phillip [name] is an 68 y.o. male past medical history of end-stage renal disease on hemodialysis Tuesday Thursdays and Saturdays, diabetes mellitus type 2, recent acute cholecystitis in June 2025 requiring colostomy tube placement, chronic HFrEF with last EF of 30% in 2025, aortic stenosis status post TAVR 8/19/ 2025, PAD status post PCI to the left tibia-fibula on 12/26/2023 admitted by vascular surgery on 01/14/2024 for elective left third toe amputation due to gangrenous left third toe underwent angiogram of the left lower extremity on 12/26/2023 at [facility], however due to underlying comorbidities he was transferred to [facility]  Significant Events: 8/13 - left 3rd toe amputation 8/15 - left TMA and wound vac placment 8/19 - TAVR 8/22 - wound vac removed.   Assessment/Plan:   Gangrene of toe of left foot (HCC) Status post transmetatarsal amputation with wound VAC placement on 01/16/2024. Wound VAC removed on 01/23/2024. Vascular surgery recommended surgery, status post BKA on 01/30/2024.  Remove staples in 4 weeks. Hemoglobin stable at 8.5.  Continue aspirin  and Plavix . Being evaluated by inpatient rehab for placement Further management per vascular surgery. PT evaluated the patient will need inpatient rehab  Severe aortic stenosis: Cardiology was consulted, status post TAVR's on 01/20/2024.  Chronic HFpEF:  Appears euvolemic, continue volume status per hemodialysis.  End-stage renal disease: Nephrology on board continue to dialyze Tuesday Thursdays and Saturdays.  CAD with CABG: Last cath on February 2025. Continue aspirin  and Plavix .  Recent history of acute cholecystitis requiring cholecystectomy: Drain placed on 11/21/2023. Has remained afebrile with no leukocytosis, some erythema around the  percutaneous biliary drain site with some purulent drainage soaking the gauze.   IR was consulted recommended a CT scan of the abdomen pelvis CT scan of the abdomen pelvis shows some soft tissue thickening and stranding with minimal fluid seen adjacent to the portion of the tubing.  No pericholecystic collection, some mild pericholecystic fat stranding. Continue cover MRSA and gram-negative's. Continue Augmentin  and linezolid  for 3 to 4 weeks. IR recommended that if no abscess seen on CT scan will schedule drain exchange next week.  Controlled diabetes mellitus type 2: Continue sliding scale insulin , last A1c of 5.8.  Hypothyroidism: Continue Synthroid .  Goals of care: PT OT has been consulted, being evaluated for inpatient rehab.   DVT prophylaxis: lovenox  Family Communication:none Status is: Inpatient Remains inpatient appropriate because: Below the knee amputation    Code Status:     Code Status Orders  (From admission, onward)           Start     Ordered   01/14/24 1727  Full code  Continuous       Question:  By:  Answer:  Consent: discussion documented in EHR   01/14/24 1727           Code Status History     Date Active Date Inactive Code Status Order ID Comments User Context   12/26/2023 1322 12/26/2023 2204 Full Code [id number]  Marea Selinda RAMAN, MD Inpatient   11/12/2023 0613 11/14/2023 2013 Full Code [id number]  Alfornia Madison, MD Inpatient   08/12/2023 1025 08/25/2023 1852 Full Code [id number]  Claudene Maximino LABOR, MD ED   08/01/2023 1314 08/01/2023 2034 Full Code [id number]  Wendel Lurena POUR, MD Inpatient   09/30/2022 1727 09/30/2022 2326 Full Code [id number]  Dew,  Selinda RAMAN, MD Inpatient   11/06/2020 2006 11/10/2020 1950 Full Code [id number]  Juvenal Harlene PENNER, DO Inpatient   02/18/2020 2326 02/22/2020 1919 Full Code [id number]  Tobie Jorie SAUNDERS, MD ED   09/22/2018 1831 09/26/2018 1649 Full Code [id number]  Jordis Laneta FALCON, MD Inpatient   09/08/2018 1838 09/21/2018 2107 Full Code  [id number]  Jordis Laneta FALCON, MD Inpatient   03/13/2018 1152 03/13/2018 1730 Full Code [id number]  Ashley Soulier, DPM Inpatient   03/02/2018 1033 03/02/2018 1539 Full Code [id number]  Marea Selinda RAMAN, MD Inpatient         IV Access:   Peripheral IV   Procedures and diagnostic studies:   CT ABDOMEN PELVIS W CONTRAST Result Date: 02/02/2024 CLINICAL DATA:  Provided history: purulence from cholecystostomy tube, rule out abscess EXAM: CT ABDOMEN AND PELVIS WITH CONTRAST TECHNIQUE: Multidetector CT imaging of the abdomen and pelvis was performed using the standard protocol following bolus administration of intravenous contrast. RADIATION DOSE REDUCTION: This exam was performed according to the departmental dose-optimization program which includes automated exposure control, adjustment of the mA and/or kV according to patient size and/or use of iterative reconstruction technique. CONTRAST:  75mL OMNIPAQUE  IOHEXOL  350 MG/ML SOLN COMPARISON:  Most recent abdominal imaging MRI 11/11/2023, abdominopelvic CT 11/06/2020 FINDINGS: Lower chest: 4 mm subpleural left lower lobe nodule, series 4, image 11, unchanged from 2022 and considered definitively benign. No pleural fluid or basilar opacity. Hepatobiliary: Cholecystostomy tube is coiled in the gallbladder. The gallbladder is filled with innumerable gallstones. Soft tissue thickening, stranding, and minimal fluid is seen adjacent to the portion of the tubing outside the gallbladder extending to the subcutaneous tissues. No definite pericholecystic collection. Suggestion of mild pericholecystic fat stranding. Gallbladder wall is not well assessed on the current exam. No common bile duct dilatation. No intrahepatic biliary ductal dilatation. Diffuse hepatic steatosis. No evidence of intrahepatic collection. Pancreas: No ductal dilatation or inflammation. Spleen: Mildly enlarged, 13.5 cm AP. Tiny hypodensity in the inferior spleen is too small to characterize but typically  of no clinical significance. Adrenals/Urinary Tract: No adrenal nodule. No hydronephrosis or evidence of renal inflammation. Absent renal excretion on delayed phase imaging. No renal calculi. Partially distended urinary bladder. No bladder wall thickening. Stomach/Bowel: Physiologic appearance of the stomach. There is no small bowel obstruction or inflammation. Moderate formed stool in the colon. Scattered colonic diverticula. No diverticulitis. Appendectomy. Vascular/Lymphatic: Advanced aortic atherosclerosis. No aortic aneurysm. Patent portal and splenic veins. No suspicious lymphadenopathy. Reproductive: Prostate is unremarkable. Other: No free air. No significant ascites. Soft tissue density in gas within the anterior abdominal wall likely related to medication injections. Musculoskeletal: No acute osseous findings. Right hip osteoarthritis. Lumbar degenerative change. Chronic bilateral L5 pars defects. IMPRESSION: 1. Cholecystostomy tube is coiled in the gallbladder. The gallbladder is filled with innumerable gallstones. Soft tissue thickening, stranding, and minimal fluid is seen adjacent to the portion of the tubing outside the gallbladder extending to the subcutaneous tissues. No definite pericholecystic collection. Suggestion of mild pericholecystic fat stranding. 2. Hepatic steatosis. 3. Mild splenomegaly. 4. Colonic diverticulosis without diverticulitis. 5. Absent renal excretion on delayed phase imaging, consistent with end-stage renal disease. Aortic Atherosclerosis (ICD10-I70.0). Electronically Signed   By: Andrea Gasman M.D.   On: 02/02/2024 19:51     Medical Consultants:   None.   Subjective:    Phillip [name] no complaints this morning.  Objective:    Vitals:   02/03/24 0020 02/03/24 0335 02/03/24 0606 02/03/24 0731  BP: (!) 100/52 106/76  110/67  Pulse: 72 78 79 73  Resp: 15 18 13 16   Temp: 98.2 F (36.8 C) 98.9 F (37.2 C)  97.8 F (36.6 C)  TempSrc: Oral Oral  Oral   SpO2: 100% 100% 100% 100%  Weight:   72.6 kg   Height:       SpO2: 100 % O2 Flow Rate (L/min): 0 L/min   Intake/Output Summary (Last 24 hours) at 02/03/2024 0737 Last data filed at 02/02/2024 1400 Gross per 24 hour  Intake 600 ml  Output --  Net 600 ml     Filed Weights   02/01/24 0500 02/02/24 0500 02/03/24 0606  Weight: 73.2 kg 72.8 kg 72.6 kg    Exam: General exam: In no acute distress. Respiratory system: Good air movement and clear to auscultation. Cardiovascular system: S1 & S2 heard, RRR. No JVD. Gastrointestinal system: Abdomen is nondistended, soft and nontender.  Extremities: Left below the knee amputation Skin: No rashes, lesions or ulcers Psychiatry: Judgement and insight appear normal. Mood & affect appropriate. Data Reviewed:    Labs: Basic Metabolic Panel: Recent Labs  Lab 01/28/24 0311 01/30/24 0708 01/30/24 0844 01/30/24 0917 01/31/24 0341 02/01/24 0342 02/02/24 0429  NA 134* 135 130* 134* 132* 132* 136  K 4.2 4.3 8.3* 4.3 4.7 3.9 4.3  CL 93* 92* 96* 94* 92* 92* 92*  CO2 25 27  --   --  22 28 28   GLUCOSE 99 111* 115* 114* 169* 119* 122*  BUN 42* 33* 52* 34* 45* 26* 41*  CREATININE 6.74* 6.20* 6.10* 5.90* 7.03* 4.80* 6.87*  CALCIUM  8.6* 9.0  --   --  8.3* 8.2* 8.6*  PHOS 4.9*  --   --   --   --   --   --    GFR Estimated Creatinine Clearance: 10.6 mL/min (A) (by C-G formula based on SCr of 6.87 mg/dL (H)). Liver Function Tests: No results for input(s): AST, ALT, ALKPHOS, BILITOT, PROT, ALBUMIN  in the last 168 hours.  No results for input(s): LIPASE, AMYLASE in the last 168 hours. No results for input(s): AMMONIA in the last 168 hours. Coagulation profile No results for input(s): INR, PROTIME in the last 168 hours. COVID-19 Labs  No results for input(s): DDIMER, FERRITIN, LDH, CRP in the last 72 hours.  Lab Results  Component Value Date   SARSCOV2NAA NEGATIVE 11/11/2023   SARSCOV2NAA NEGATIVE 11/06/2020    SARSCOV2NAA NEGATIVE 02/18/2020   SARSCOV2NAA NEGATIVE 10/20/2018    CBC: Recent Labs  Lab 01/28/24 0311 01/30/24 0708 01/30/24 0844 01/30/24 0917 01/31/24 0341 02/01/24 0342  WBC 11.0* 9.7  --   --  10.3 10.5  HGB 9.6* 9.8* 11.2* 10.9* 8.5* 8.8*  HCT 30.1* 32.1* 33.0* 32.0* 27.6* 27.9*  MCV 95.6 97.9  --   --  96.2 95.9  PLT 148* 173  --   --  139* 151   Cardiac Enzymes: No results for input(s): CKTOTAL, CKMB, CKMBINDEX, TROPONINI in the last 168 hours. BNP (last 3 results) Recent Labs    07/14/23 1412  PROBNP 11,269*   CBG: Recent Labs  Lab 02/02/24 0601 02/02/24 1159 02/02/24 1657 02/02/24 2057 02/03/24 0605  GLUCAP 108* 217* 104* 128* 90   D-Dimer: No results for input(s): DDIMER in the last 72 hours. Hgb A1c: No results for input(s): HGBA1C in the last 72 hours. Lipid Profile: No results for input(s): CHOL, HDL, LDLCALC, TRIG, CHOLHDL, LDLDIRECT in the last 72 hours. Thyroid  function studies: No results for input(s): TSH, T4TOTAL, T3FREE, THYROIDAB in  the last 72 hours.  Invalid input(s): FREET3 Anemia work up: No results for input(s): VITAMINB12, FOLATE, FERRITIN, TIBC, IRON , RETICCTPCT in the last 72 hours. Sepsis Labs: Recent Labs  Lab 01/28/24 0311 01/30/24 0708 01/31/24 0341 02/01/24 0342  WBC 11.0* 9.7 10.3 10.5   Microbiology No results found for this or any previous visit (from the past 240 hours).   Medications:    (feeding supplement) PROSource Plus  30 mL Oral BID BM   amoxicillin -clavulanate  1 tablet Oral Q8H   aspirin  EC  81 mg Oral Daily   atorvastatin   20 mg Oral Daily   carvedilol   3.125 mg Oral BID   clopidogrel   75 mg Oral Daily   [START ON 02/06/2024] darbepoetin (ARANESP ) injection - DIALYSIS  60 mcg Subcutaneous Once   enoxaparin  (LOVENOX ) injection  30 mg Subcutaneous Q24H   isosorbide  mononitrate  120 mg Oral Daily   levothyroxine   50 mcg Oral Daily   linezolid   600 mg  Oral Q12H   sevelamer  carbonate  800 mg Oral TID WC   sodium chloride  flush  3 mL Intravenous Q12H   tamsulosin   0.4 mg Oral QPC breakfast   Continuous Infusions:  sodium chloride         LOS: 20 days   Erle Odell Castor  [facility]  02/03/2024, 7:37 AM

## 2024-02-03 NOTE — Progress Notes (Signed)
 [facility] Progress Note   Subjective:  Seen in room - for HD later today. No CP/dyspnea. Leg pain slowly improving.  Objective Vitals:   02/03/24 0020 02/03/24 0335 02/03/24 0606 02/03/24 0731  BP: (!) 100/52 106/76  110/67  Pulse: 72 78 79 73  Resp: 15 18 13 16   Temp: 98.2 F (36.8 C) 98.9 F (37.2 C)  97.8 F (36.6 C)  TempSrc: Oral Oral  Oral  SpO2: 100% 100% 100% 100%  Weight:   72.6 kg   Height:       Physical Exam General: Well appearing man, NAD Heart: RRR Lungs: CTAB Extremities: No edema, L BKA bandaged Dialysis Access: LUE AVF +t/b  Additional Objective Labs: Basic Metabolic Panel: Recent Labs  Lab 01/28/24 0311 01/30/24 0708 01/31/24 0341 02/01/24 0342 02/02/24 0429  NA 134*   < > 132* 132* 136  K 4.2   < > 4.7 3.9 4.3  CL 93*   < > 92* 92* 92*  CO2 25   < > 22 28 28   GLUCOSE 99   < > 169* 119* 122*  BUN 42*   < > 45* 26* 41*  CREATININE 6.74*   < > 7.03* 4.80* 6.87*  CALCIUM  8.6*   < > 8.3* 8.2* 8.6*  PHOS 4.9*  --   --   --   --    < > = values in this interval not displayed.   CBC: Recent Labs  Lab 01/28/24 0311 01/30/24 0708 01/30/24 0844 01/30/24 0917 01/31/24 0341 02/01/24 0342  WBC 11.0* 9.7  --   --  10.3 10.5  HGB 9.6* 9.8*   < > 10.9* 8.5* 8.8*  HCT 30.1* 32.1*   < > 32.0* 27.6* 27.9*  MCV 95.6 97.9  --   --  96.2 95.9  PLT 148* 173  --   --  139* 151   < > = values in this interval not displayed.   Studies/Results: CT ABDOMEN PELVIS W CONTRAST Result Date: 02/02/2024 CLINICAL DATA:  Provided history: purulence from cholecystostomy tube, rule out abscess EXAM: CT ABDOMEN AND PELVIS WITH CONTRAST TECHNIQUE: Multidetector CT imaging of the abdomen and pelvis was performed using the standard protocol following bolus administration of intravenous contrast. RADIATION DOSE REDUCTION: This exam was performed according to the departmental dose-optimization program which includes automated exposure control, adjustment of  the mA and/or kV according to patient size and/or use of iterative reconstruction technique. CONTRAST:  75mL OMNIPAQUE  IOHEXOL  350 MG/ML SOLN COMPARISON:  Most recent abdominal imaging MRI 11/11/2023, abdominopelvic CT 11/06/2020 FINDINGS: Lower chest: 4 mm subpleural left lower lobe nodule, series 4, image 11, unchanged from 2022 and considered definitively benign. No pleural fluid or basilar opacity. Hepatobiliary: Cholecystostomy tube is coiled in the gallbladder. The gallbladder is filled with innumerable gallstones. Soft tissue thickening, stranding, and minimal fluid is seen adjacent to the portion of the tubing outside the gallbladder extending to the subcutaneous tissues. No definite pericholecystic collection. Suggestion of mild pericholecystic fat stranding. Gallbladder wall is not well assessed on the current exam. No common bile duct dilatation. No intrahepatic biliary ductal dilatation. Diffuse hepatic steatosis. No evidence of intrahepatic collection. Pancreas: No ductal dilatation or inflammation. Spleen: Mildly enlarged, 13.5 cm AP. Tiny hypodensity in the inferior spleen is too small to characterize but typically of no clinical significance. Adrenals/Urinary Tract: No adrenal nodule. No hydronephrosis or evidence of renal inflammation. Absent renal excretion on delayed phase imaging. No renal calculi. Partially distended urinary bladder. No bladder  wall thickening. Stomach/Bowel: Physiologic appearance of the stomach. There is no small bowel obstruction or inflammation. Moderate formed stool in the colon. Scattered colonic diverticula. No diverticulitis. Appendectomy. Vascular/Lymphatic: Advanced aortic atherosclerosis. No aortic aneurysm. Patent portal and splenic veins. No suspicious lymphadenopathy. Reproductive: Prostate is unremarkable. Other: No free air. No significant ascites. Soft tissue density in gas within the anterior abdominal wall likely related to medication injections.  Musculoskeletal: No acute osseous findings. Right hip osteoarthritis. Lumbar degenerative change. Chronic bilateral L5 pars defects. IMPRESSION: 1. Cholecystostomy tube is coiled in the gallbladder. The gallbladder is filled with innumerable gallstones. Soft tissue thickening, stranding, and minimal fluid is seen adjacent to the portion of the tubing outside the gallbladder extending to the subcutaneous tissues. No definite pericholecystic collection. Suggestion of mild pericholecystic fat stranding. 2. Hepatic steatosis. 3. Mild splenomegaly. 4. Colonic diverticulosis without diverticulitis. 5. Absent renal excretion on delayed phase imaging, consistent with end-stage renal disease. Aortic Atherosclerosis (ICD10-I70.0). Electronically Signed   By: Andrea Gasman M.D.   On: 02/02/2024 19:51   Medications:  sodium chloride       (feeding supplement) PROSource Plus  30 mL Oral BID BM   amoxicillin -clavulanate  1 tablet Oral BID   aspirin  EC  81 mg Oral Daily   atorvastatin   20 mg Oral Daily   carvedilol   3.125 mg Oral BID   clopidogrel   75 mg Oral Daily   [START ON 02/06/2024] darbepoetin (ARANESP ) injection - DIALYSIS  60 mcg Subcutaneous Once   enoxaparin  (LOVENOX ) injection  30 mg Subcutaneous Q24H   isosorbide  mononitrate  120 mg Oral Daily   levothyroxine   50 mcg Oral Daily   linezolid   600 mg Oral Q12H   sevelamer  carbonate  800 mg Oral TID WC   sodium chloride  flush  3 mL Intravenous Q12H   tamsulosin   0.4 mg Oral QPC breakfast    Dialysis Orders TTS [facility] 4hr, 350/800, EDW 81kg, 2K/2.5Ca, L AVF, no heparin  - no VDRA, ESA ordered but not yet given   Assessment/Plan: 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 Per [facility]VS, there are concerns for wound healing. Severe aortic stenosis s/p TAVR 01/20/24: Zio monitor in place. ESRD: Continue HD on TTS schedule - > for HD today. HTN/volume: BP soft, on low dose BB and isosorbide . Below prior EDW - will  lower on discharge. Anemia of ESRD: Hgb 8.8 - ESA ordered (will start 9/5) Secondary HPTH: Ca/Phos ok - continue sevelamer  as binder. Nutrition: Alb low, continue supps. Hx cholecystitis with perc chole drain still in place: Erythema around drain, on PO abx. T2DM Dispo: Likely needs rehab, CIR being considered.   Izetta Boehringer, PA-C 02/03/2024, 11:11 AM  [employer]

## 2024-02-03 NOTE — Progress Notes (Signed)
 Inpatient Rehab Admissions Coordinator:   Sent to [facility] for review for prior auth.  Will follow.   Reche Lowers, PT, DPT Admissions Coordinator [phone] 02/03/24  12:39 PM

## 2024-02-03 NOTE — Progress Notes (Signed)
 Occupational Therapy Treatment Patient Details Name: Phillip [name] MRN: [id number] DOB: 03-May-1956 Today's Date: 02/03/2024   History of present illness Phillip [name] is a 68 y.o. male admitted 01/14/24 for left third toe gangrene. Pt s/p left third toe amputation including met head 8/13 and left TMA with wound vac application 8/15. Pt s/p L BKA 01/30/24. Plan CT abdomen 9/1 due to increased purulence around R chole drain. TAVR on 8/19. PMHx: T1DM, PAD s/p angioplasty/PCI to left tibioperoneal trunk on 7/25, ESRD on HD TTS, HTN, chronic HFrEF, severe aortic stenosis, acute cholecystitis requiring cholecystostomy drain 6/25.   OT comments  Pt presented in chair and agreeable for session. Pt attempted 3 STS transfers with max standing tolerance of 10 seconds but was only able to get one UE onto RW while positioning other on arm rest of chair. Pt then was set up of oral care and completed and was agreeable to remain OOB. Patient will benefit from intensive inpatient follow-up therapy, >3 hours/day       If plan is discharge home, recommend the following:  A lot of help with walking and/or transfers;A lot of help with bathing/dressing/bathroom   Equipment Recommendations  Other (comment) (TBD)    Recommendations for Other Services Rehab consult    Precautions / Restrictions Precautions Precautions: Fall;Other (comment) Recall of Precautions/Restrictions: Intact Precaution/Restrictions Comments: R chole drain- abdomen Required Braces or Orthoses: Other Brace Other Brace: limb protector, needs to don for OOB Restrictions Weight Bearing Restrictions Per Provider Order: Yes LLE Weight Bearing Per Provider Order: Non weight bearing Other Position/Activity Restrictions: limb guard when transferring OOB or to [facility]       Mobility Bed Mobility               General bed mobility comments: pt presented in chair    Transfers Overall transfer level: Needs assistance Equipment used:  Rolling walker (2 wheels) Transfers: Sit to/from Stand Sit to Stand: Max assist                 Balance Overall balance assessment: Needs assistance Sitting-balance support: Feet supported Sitting balance-Leahy Scale: Good       Standing balance-Leahy Scale: Zero Standing balance comment: reliant on BUE                           ADL either performed or assessed with clinical judgement   ADL Overall ADL's : Needs assistance/impaired Eating/Feeding: Set up;Sitting   Grooming: Wash/dry face;Oral care;Set up;Sitting   Upper Body Bathing: Set up;Sitting   Lower Body Bathing: Maximal assistance;Sitting/lateral leans   Upper Body Dressing : Set up;Sitting   Lower Body Dressing: Maximal assistance;Sitting/lateral leans Lower Body Dressing Details (indicate cue type and reason): pt reaching down to RLE but unable to complete                    Extremity/Trunk Assessment Upper Extremity Assessment Upper Extremity Assessment: Generalized weakness   Lower Extremity Assessment Lower Extremity Assessment: Defer to PT evaluation        Vision       Perception     Praxis     Communication Communication Communication: No apparent difficulties   Cognition Arousal: Alert Behavior During Therapy: WFL for tasks assessed/performed Cognition: Cognition impaired       Memory impairment (select all impairments): Non-declarative long-term memory Attention impairment (select first level of impairment): Divided attention Executive functioning impairment (select all impairments): Problem solving, Reasoning,  Sequencing OT - Cognition Comments: Delayed processing speed; cues for initiation of tasks and problem solving                 Following commands: Intact        Cueing   Cueing Techniques: Verbal cues, Gestural cues  Exercises      Shoulder Instructions       General Comments      Pertinent Vitals/ Pain       Pain Assessment Pain  Assessment: Faces Faces Pain Scale: Hurts whole lot Breathing: normal Negative Vocalization: occasional moan/groan, low speech, negative/disapproving quality Facial Expression: smiling or inexpressive Body Language: tense, distressed pacing, fidgeting Consolability: no need to console PAINAD Score: 2 Pain Location: R drain,LLE Pain Intervention(s): Limited activity within patient's tolerance, Monitored during session, Repositioned  Home Living                                          Prior Functioning/Environment              Frequency  Min 2X/week        Progress Toward Goals  OT Goals(current goals can now be found in the care plan section)  Progress towards OT goals: Progressing toward goals  Acute Rehab OT Goals Patient Stated Goal: to get stronger OT Goal Formulation: With patient Time For Goal Achievement: 02/14/24 Potential to Achieve Goals: Good ADL Goals Pt Will Perform Grooming: with modified independence;sitting Pt Will Perform Upper Body Bathing: with modified independence;sitting Pt Will Perform Lower Body Dressing: with contact guard assist;sit to/from stand Pt Will Transfer to Toilet: with contact guard assist;ambulating;regular height toilet Pt Will Perform Toileting - Clothing Manipulation and hygiene: with mod assist;sitting/lateral leans  Plan      Co-evaluation                 AM-PAC OT 6 Clicks Daily Activity     Outcome Measure   Help from another person eating meals?: None Help from another person taking care of personal grooming?: A Little Help from another person toileting, which includes using toliet, bedpan, or urinal?: A Lot Help from another person bathing (including washing, rinsing, drying)?: A Lot Help from another person to put on and taking off regular upper body clothing?: A Little Help from another person to put on and taking off regular lower body clothing?: A Lot 6 Click Score: 16    End of  Session Equipment Utilized During Treatment: Gait belt;Rolling walker (2 wheels)  OT Visit Diagnosis: Unsteadiness on feet (R26.81);Other abnormalities of gait and mobility (R26.89);Muscle weakness (generalized) (M62.81)   Activity Tolerance Patient limited by fatigue   Patient Left in chair;with call bell/phone within reach;with chair alarm set   Nurse Communication Mobility status        Time: 8987-8964 OT Time Calculation (min): 23 min  Charges: OT General Charges $OT Visit: 1 Visit OT Treatments $Self Care/Home Management : 23-37 mins  Warrick POUR OTR/L  Acute Rehab Services  [phone] office number   Warrick Berber 02/03/2024, 11:32 AM

## 2024-02-04 LAB — GLUCOSE, CAPILLARY
Glucose-Capillary: 100 mg/dL — ABNORMAL HIGH (ref 70–99)
Glucose-Capillary: 121 mg/dL — ABNORMAL HIGH (ref 70–99)

## 2024-02-04 MED ORDER — DARBEPOETIN ALFA 60 MCG/0.3ML IJ SOSY
60.0000 ug | PREFILLED_SYRINGE | Freq: Once | INTRAMUSCULAR | Status: AC
  Administered 2024-02-04: 60 ug via SUBCUTANEOUS
  Filled 2024-02-04: qty 0.3

## 2024-02-04 NOTE — Progress Notes (Signed)
   02/03/24 2130  Vitals  Temp 97.7 F (36.5 C)  Temp Source Oral  BP 120/62  MAP (mmHg) 79  Pulse Rate 75  ECG Heart Rate 74  Resp 14  Oxygen Therapy  SpO2 100 %  During Treatment Monitoring  Blood Flow Rate (mL/min) 0 mL/min  Arterial Pressure (mmHg) -1.61 mmHg  Venous Pressure (mmHg) -1.21 mmHg  TMP (mmHg) 18.58 mmHg  Ultrafiltration Rate (mL/min) 682 mL/min  Dialysate Flow Rate (mL/min) 299 ml/min  Dialysate Potassium Concentration 3  Dialysate Calcium  Concentration 2.5  Duration of HD Treatment -hour(s) 3.5 hour(s)  Cumulative Fluid Removed (mL) per Treatment  1500.1  HD Safety Checks Performed Yes  Intra-Hemodialysis Comments Tx completed  Post Treatment  Dialyzer Clearance Lightly streaked  Liters Processed 78.8  Fluid Removed (mL) 1500 mL  Tolerated HD Treatment Yes  AVG/AVF Arterial Site Held (minutes) 10 minutes  AVG/AVF Venous Site Held (minutes) 10 minutes  Fistula / Graft Left Upper arm  Placement Date/Time: 01/21/24 0000   Placed prior to admission: Yes  Orientation: Left  Access Location: Upper arm  Site Condition No complications  Fistula / Graft Assessment Present;Thrill;Bruit  Status Deaccessed  Needle Size 15  Drainage Description None

## 2024-02-04 NOTE — Progress Notes (Signed)
 [facility] PROGRESS NOTE    Progress Note  Phillip [name]  FMW:[id number] DOB: 10-Dec-1955 DOA: 01/14/2024 PCP: [name] LABOR., MD     Brief Narrative:   Phillip [name] is an 68 y.o. male past medical history of end-stage renal disease on hemodialysis Tuesday Thursdays and Saturdays, diabetes mellitus type 2, recent acute cholecystitis in June 2025 requiring colostomy tube placement, chronic HFrEF with last EF of 30% in 2025, aortic stenosis status post TAVR 8/19/ 2025, PAD status post PCI to the left tibia-fibula on 12/26/2023 admitted by vascular surgery on 01/14/2024 for elective left third toe amputation due to gangrenous left third toe underwent angiogram of the left lower extremity on 12/26/2023 at [facility], however due to underlying comorbidities he was transferred to [facility]  Significant Events: 8/13 - left 3rd toe amputation 8/15 - left TMA and wound vac placment 8/19 - TAVR 8/22 - wound vac removed. 9/2: Exchange cholecystostomy tube   Assessment/Plan:   Gangrene of toe of left foot (HCC) Status post transmetatarsal amputation with wound VAC placement on 01/16/2024. Wound VAC removed on 01/23/2024. Vascular surgery recommended surgery, status post BKA on 01/30/2024.  Remove staples in 4 weeks. Hemoglobin stable at 8.5.  Continue aspirin  and Plavix . Being evaluated by inpatient rehab for placement Further management per vascular surgery. PT evaluated the patient will need inpatient rehab  Severe aortic stenosis: status post TAVR's on 01/20/2024.  Chronic HFpEF:  Appears euvolemic, continue volume status per hemodialysis.  End-stage renal disease: Nephrology on board continue to dialyze Tuesday Thursdays and Saturdays.  CAD with CABG: Last cath on February 2025. Continue aspirin  and Plavix .  Recent history of acute cholecystitis requiring cholecystectomy: Drain placed on 11/21/2023. Has remained afebrile with no leukocytosis, some erythema around the  percutaneous biliary drain site with some purulent drainage soaking the gauze.   CT scan of the abdomen pelvis shows some soft tissue thickening and stranding with minimal fluid seen adjacent to the portion of the tubing.  No pericholecystic collection, some mild pericholecystic fat stranding. Continue cover MRSA and gram-negative's. Continue Augmentin  and linezolid  for 3 to 4 weeks. Cholecystostomy tube exchanged 02/03/2024  Controlled diabetes mellitus type 2: Continue sliding scale insulin , last A1c of 5.8.  Hypothyroidism: Continue Synthroid .  Goals of care: PT OT has been consulted, being evaluated for inpatient rehab.   DVT prophylaxis: lovenox  Family Communication:none Status is: Inpatient Remains inpatient appropriate because: Below the knee amputation    Code Status:     Code Status Orders  (From admission, onward)           Start     Ordered   01/14/24 1727  Full code  Continuous       Question:  By:  Answer:  Consent: discussion documented in EHR   01/14/24 1727           Code Status History     Date Active Date Inactive Code Status Order ID Comments User Context   12/26/2023 1322 12/26/2023 2204 Full Code [id number]  Marea Selinda RAMAN, MD Inpatient   11/12/2023 0613 11/14/2023 2013 Full Code [id number]  Alfornia Madison, MD Inpatient   08/12/2023 1025 08/25/2023 1852 Full Code [id number]  Claudene Maximino LABOR, MD ED   08/01/2023 1314 08/01/2023 2034 Full Code [id number]  Wendel Lurena POUR, MD Inpatient   09/30/2022 1727 09/30/2022 2326 Full Code [id number]  Marea Selinda RAMAN, MD Inpatient   11/06/2020 2006 11/10/2020 1950 Full Code [id number]  Juvenal Harlene PENNER, DO Inpatient   02/18/2020  2326 02/22/2020 1919 Full Code [id number]  Tobie Jorie SAUNDERS, MD ED   09/22/2018 1831 09/26/2018 1649 Full Code [id number]  Jordis Laneta FALCON, MD Inpatient   09/08/2018 1838 09/21/2018 2107 Full Code [id number]  Jordis Laneta FALCON, MD Inpatient   03/13/2018 1152 03/13/2018 1730 Full Code [id number]  Ashley Soulier, [facility]  Inpatient   03/02/2018 1033 03/02/2018 1539 Full Code [id number]  Marea Selinda RAMAN, MD Inpatient         IV Access:   Peripheral IV   Procedures and diagnostic studies:   IR EXCHANGE BILIARY DRAIN Result Date: 02/03/2024 INDICATION: History of acute cholecystitis, post ultrasound fluoroscopic guided cholecystostomy tube placement on 12/31/2023. EXAM: FLUOROSCOPIC GUIDED CHOLECYSTOSTOMY TUBE EXCHANGE COMPARISON:  12/31/2023, 02/02/2024 MEDICATIONS: None ANESTHESIA/SEDATION: None CONTRAST:  20mL OMNIPAQUE  IOHEXOL  300 MG/ML SOLN - administered into the gallbladder lumen. FLUOROSCOPY TIME:  Three mGy reference air kerma COMPLICATIONS: None immediate. PROCEDURE: The patient was positioned supine on the fluoroscopy table. The external portion of the existing cholecystostomy tube as well as the surrounding skin was prepped and draped in usual sterile fashion. A time-out was performed prior to the initiation of the procedure. A preprocedural spot fluoroscopic image was obtained of the right upper abdominal quadrant existing cholecystostomy tube. The skin surrounding the cholecystostomy tube was anesthetized with 1% lidocaine  with epinephrine . The external portion of the cholecystostomy tube was cut and cannulated with a short Amplatz wire which was advanced through the tube and coiled within the gallbladder lumen Next, under intermittent fluoroscopic guidance, the existing 10 French cholecystostomy tube was exchanged for a new 10 [place] cholecystostomy tube which was repositioned into the more central aspect of the gallbladder lumen. Contrast injection confirms appropriate positioning and functionality of the cholecystomy tube. The cholecystostomy tube was flushed with a small amount of saline and reconnected to a suction bulb. The cholecystostomy tube was secured with an interrupted suture and a Stat Lock device. A dressing was applied. The patient tolerated the procedure well without immediate postprocedural  complication. FINDINGS: Preprocedural spot fluoroscopic image demonstrates unchanged positioning of cholecystostomy tube with end coiled and locked over the expected location of the fundus of the gallbladder There was purulent aspirate from the drain. After fluoroscopic guided exchange, the new 10 [place] cholecystostomy tube is more ideally positioned with end coiled and locked within the central aspect of the gallbladder lumen. Post exchange cholangiogram demonstrates appropriate positioning and functionality of the new cholecystostomy tube. IMPRESSION: Successful fluoroscopic guided exchange of 10 Fr percutaneous cholecystostomy tube. PLAN: Keep to bulb suction until purulent output clears, then switch to bag drainage. Ester Sides, MD [facility] Radiology Electronically Signed   By: Ester Sides M.D.   On: 02/03/2024 22:20     Medical Consultants:   None.   Subjective:    Phillip [name] is seen and examined at the bedside.  He has received multiple doses of Dilaudid  for left BKA stump pain.  He denies any abdominal pain.  Vital signs have been stable.  He is afebrile. Objective:    Vitals:   02/04/24 1400 02/04/24 1430 02/04/24 1500 02/04/24 1530  BP: (!) 107/59 107/62 107/65 94/66  Pulse: 82 83 84 85  Resp: 13 13 13 12   Temp:      TempSrc:      SpO2: 99% 99% 100% 99%  Weight:      Height:       SpO2: 99 % O2 Flow Rate (L/min): 0 L/min   Intake/Output Summary (Last 24  hours) at 02/04/2024 1607 Last data filed at 02/03/2024 2219 Gross per 24 hour  Intake 3 ml  Output 1520 ml  Net -1517 ml     Filed Weights   02/03/24 0606 02/03/24 1710 02/04/24 0632  Weight: 72.6 kg 70.5 kg 76.6 kg    Exam: General exam: In no acute distress. Respiratory system: Good air movement and clear to auscultation. Cardiovascular system: S1 & S2 heard, RRR. No JVD. Gastrointestinal system: Abdomen is nondistended, soft and nontender.   Extremities: Left below the knee amputation Skin: No rashes, lesions or ulcers Psychiatry: Judgement and insight appear normal. Mood & affect appropriate. Data Reviewed:    Labs: Basic Metabolic Panel: Recent Labs  Lab 01/30/24 0708 01/30/24 0844 01/30/24 0917 01/31/24 0341 02/01/24 0342 02/02/24 0429 02/03/24 1630  NA 135   < > 134* 132* 132* 136 128*  K 4.3   < > 4.3 4.7 3.9 4.3 4.6  CL 92*   < > 94* 92* 92* 92* 89*  CO2 27  --   --  22 28 28 22   GLUCOSE 111*   < > 114* 169* 119* 122* 137*  BUN 33*   < > 34* 45* 26* 41* 62*  CREATININE 6.20*   < > 5.90* 7.03* 4.80* 6.87* 9.08*  CALCIUM  9.0  --   --  8.3* 8.2* 8.6* 8.1*  PHOS  --   --   --   --   --   --  4.4   < > = values in this interval not displayed.   GFR Estimated Creatinine Clearance: 8.4 mL/min (A) (by C-G formula based on SCr of 9.08 mg/dL (H)). Liver Function Tests: Recent Labs  Lab 02/03/24 1630  ALBUMIN  2.1*    No results for input(s): LIPASE, AMYLASE in the last 168 hours. No results for input(s): AMMONIA in the last 168 hours. Coagulation profile No results for input(s): INR, PROTIME in the last 168 hours. COVID-19 Labs  No results for input(s): DDIMER, FERRITIN, LDH, CRP in the last 72 hours.  Lab Results  Component Value Date   SARSCOV2NAA NEGATIVE 11/11/2023   SARSCOV2NAA NEGATIVE 11/06/2020   SARSCOV2NAA NEGATIVE 02/18/2020   SARSCOV2NAA NEGATIVE 10/20/2018    CBC: Recent Labs  Lab 01/30/24 0708 01/30/24 0844 01/30/24 0917 01/31/24 0341 02/01/24 0342 02/03/24 1630  WBC 9.7  --   --  10.3 10.5 8.9  HGB 9.8* 11.2* 10.9* 8.5* 8.8* 7.9*  HCT 32.1* 33.0* 32.0* 27.6* 27.9* 25.7*  MCV 97.9  --   --  96.2 95.9 96.3  PLT 173  --   --  139* 151 144*   Cardiac Enzymes: No results for input(s): CKTOTAL, CKMB, CKMBINDEX, TROPONINI in the last 168 hours. BNP (last 3 results) Recent Labs    07/14/23 1412  PROBNP 11,269*   CBG: Recent Labs  Lab  02/02/24 2057 02/03/24 0605 02/03/24 1150 02/03/24 2258 02/04/24 0631  GLUCAP 128* 90 162* 108* 100*   D-Dimer: No results for input(s): DDIMER in the last 72 hours. Hgb A1c: No results for input(s): HGBA1C in the last 72 hours. Lipid Profile: No results for input(s): CHOL, HDL, LDLCALC, TRIG, CHOLHDL, LDLDIRECT in the last 72 hours. Thyroid  function studies: No results for input(s): TSH, T4TOTAL, T3FREE, THYROIDAB in the last 72 hours.  Invalid input(s): FREET3 Anemia work up: No results for input(s): VITAMINB12, FOLATE, FERRITIN, TIBC, IRON , RETICCTPCT in the last 72 hours. Sepsis Labs: Recent Labs  Lab 01/30/24 0708 01/31/24 0341 02/01/24 0342 02/03/24 1630  WBC  9.7 10.3 10.5 8.9   Microbiology No results found for this or any previous visit (from the past 240 hours).   Medications:    (feeding supplement) PROSource Plus  30 mL Oral BID BM   amoxicillin -clavulanate  1 tablet Oral BID   aspirin  EC  81 mg Oral Daily   atorvastatin   20 mg Oral Daily   carvedilol   3.125 mg Oral BID   clopidogrel   75 mg Oral Daily   darbepoetin (ARANESP ) injection - DIALYSIS  60 mcg Subcutaneous Once   enoxaparin  (LOVENOX ) injection  30 mg Subcutaneous Q24H   isosorbide  mononitrate  120 mg Oral Daily   levothyroxine   50 mcg Oral Daily   linezolid   600 mg Oral Q12H   sevelamer  carbonate  800 mg Oral TID WC   sodium chloride  flush  3 mL Intravenous Q12H   tamsulosin   0.4 mg Oral QPC breakfast   Continuous Infusions:  sodium chloride         LOS: 21 days   Khori Rosevear  [facility]  02/04/2024, 4:07 PM

## 2024-02-04 NOTE — Progress Notes (Signed)
  Received patient in bed to unit.   Informed consent signed and in chart.    TX duration:3.5      Hand-off given to patient's nurse.  yes   Access used: Left fistula Access issues:  had to retrograde the arterial   Total UF removed: 2l iters Medication(s) given: none Post HD VS: 115/67      Hunter Hacking LPN [facility]ney Dialysis Unit

## 2024-02-04 NOTE — H&P (Signed)
 Physical Medicine and Rehabilitation Admission H&P     HPI: Phillip [name] is a 68 year old right-handed male with history significant for end-stage renal disease with hemodialysis Tuesday Thursday Saturday, hyperlipidemia, diabetes mellitus, hypertension, bradycardia, CAD with CABG 2003 maintained on aspirin  as well as Plavix ., recent acute cholecystitis June 2025 requiring cholecystostomy tube placement, anemia of chronic disease, BPH, chronic diastolic congestive heart failure, aortic stenosis (TAVR postponed due to left third toe gangrene), PAD status post PCI to left tibia fibula/angioplasty 7/25 as well as left toe amputation 2019 .  Per chart review patient lives with spouse.  1 level home 2 steps to enter.  Ambulates with assist device.  Independent with ADLs.  Presented 01/14/2024 for elective left third toe amputation due to nonhealing ulcer with gangrenous changes.  Noninvasive vascular study showed noncompressible vessels hence underwent an angiogram of left lower extremity with transluminal angioplasty/stent placement to the left tibial peroneal trunk on 7/25 at [facility].  Underwent left third toe amputation including metatarsal head 01/14/2024 per Dr. Serene.  [facility] course nephrology consulted with dialysis ongoing as directed.  Due to nonhealing left toe wound underwent left transmetatarsal amputation 01/16/2024 and wound VAC applied.  Follow-up cardiology services for severe aortic stenosis and recent delay in TAVR procedure due to left toe gangrene and was cleared for surgery undergoing TAVR 01/20/2024 per Dr. Shyrl.  Close monitoring of recent left TMA with poor healing and limb was not felt to be salvageable undergoing left below-knee amputation 01/30/2024 per Dr. Serene and limb guard applied.  Acute on chronic anemia latest hemoglobin 7.9.  Patient remains on aspirin  and Plavix  as prior to admission.  Lovenox  added for DVT prophylaxis.  Radiology service follow-up for percutaneous  cholecystostomy tube from recent acute cholecystitis June 2025 and currently remains on Augmentin  and linezolid  for 3 to 4 weeks.  Patient has remained afebrile with no leukocytosis some erythema around the percutaneous biliary drain site with some purulent drainage soaking the gauze.  CT of the abdomen pelvis showed some soft tissue thickening and stranding with minimal fluid seen adjacent to the portion of the tubing.  No peri-cholecystic collection and awaiting plan to assess for continued suction drainage versus change to gravity bag.  Therapy evaluations completed due to patient decreased functional mobility was admitted for a comprehensive rehab program  Review of Systems  Constitutional:  Negative for chills and fever.  HENT:  Negative for hearing loss.   Eyes:  Negative for blurred vision and double vision.  Respiratory:  Negative for cough and wheezing.        Short of breath with heavy exertion  Cardiovascular:  Positive for palpitations and leg swelling. Negative for chest pain.  Gastrointestinal:  Positive for constipation. Negative for heartburn, nausea and vomiting.       GERD  Genitourinary:  Negative for dysuria, flank pain and hematuria.  Musculoskeletal:  Positive for back pain, joint pain and myalgias.  Skin:  Negative for rash.  Neurological:  Positive for weakness.  All other systems reviewed and are negative.  Past Medical History:  Diagnosis Date   Abdominopelvic abscess (HCC) 09/29/2018   Abscess of appendix 09/22/2018   Acquired spondylolisthesis of lumbosacral region 03/20/2020   Acute cholecystitis 11/15/2023   Acute kidney injury (HCC) 10/07/2018   Acute kidney injury superimposed on chronic kidney disease (HCC) 11/06/2020   Acute kidney injury superimposed on CKD (HCC) 02/18/2020   Acute on chronic systolic (congestive) heart failure (HCC) 07/26/2015   Formatting of this note  might be different from the original. Formatting of this note might be different from  the original. Formatting of this note might be different from the original. revenkar EF 35% Formatting of this note might be different from the original. revenkar EF 35%     Acute on chronic systolic CHF (congestive heart failure) (HCC) 08/12/2023   Anasarca 07/01/2023   Anemia of chronic disease 08/18/2019   Anemia, chronic disease 08/18/2019   Aortic atherosclerosis (HCC)    Aortic stenosis    a.) TTE 09/15/2018: mild-mod (MPG 19); b.) TTE 03/21/2021: mild-mod (MPG 14.3); c.) TTE 04/24/2022: mild- mod (MPG 15)   Aortic stenosis, moderate 10/15/2019   Appendicitis 09/08/2018   ARF (acute renal failure) (HCC) 02/19/2020   Asthma    Atherosclerosis of native arteries of the extremities with ulceration (HCC) 02/24/2018   Formatting of this note might be different from the original.  Last Assessment & Plan:   His ABIs today are 1.07 on the right and 0.99 on the left with biphasic waveforms.  Although these pressures may be somewhat elevated from medial calcification, his flow does appear to be reasonably good.  His left ABI was 0.58 prior to intervention.  At this point, we will stretch out his follow-up and see hi   Benign hypertension with CKD (chronic kidney disease) stage IV (HCC) 02/24/2018   Benign prostatic hyperplasia without lower urinary tract symptoms 01/14/2018   Bilateral lower extremity edema 11/04/2018   Bradycardia 03/25/2018   Bruit of right carotid artery 07/26/2015   CAD (coronary artery disease) 2003   a.) s/p 4v CABG 2003   Cardiac murmur 07/26/2015   Choledocholithiasis 11/12/2023   Chronic combined systolic and diastolic CHF (congestive heart failure) (HCC) 07/26/2015   a.) TTE 09/15/2018: EF 50-55%, mod LVH, RVE, BAE, mild-mod TR, AoV sclerosis, G1DD; b.) MPI 10/27/2019: EF <30%; c.) TTE 03/21/2021: EF 35-40%, post AK, inf HK, mod LVH, mod red RVSF, mod LAE, mod Aov sclerosis, G2DD; c.) TTE 04/24/2022: EF 35-40%, post AK, glok HK, mod LVE, mod red RVSF, mild-mod MR, Aov  sclerosis, G3DD   Chronic idiopathic constipation 11/02/2018   Chronic low back pain without sciatica 12/02/2018   Chronic pain of right hip 03/31/2020   Chronic systolic heart failure (HCC) 07/26/2015   Formatting of this note might be different from the original. revenkar EF 35%     CKD (chronic kidney disease) stage 4, GFR 15-29 ml/min (HCC) 10/07/2018   Congestive heart failure (HCC) 07/26/2015   Last Assessment & Plan:   Poor cardiac function could certainly be contributing to poor blood supply to the feet and toes as well.     Formatting of this note might be different from the original.  Formatting of this note might be different from the original.  Last Assessment & Plan:   Poor cardiac function could certainly be contributing to poor blood supply to the feet and toes as well.     Last   Coronary artery disease    Diabetes mellitus without complication (HCC)    Dysphagia 07/26/2015   Encounter for long-term (current) use of insulin  (HCC) 09/27/2020   Erectile dysfunction 07/14/2017   ESRD on dialysis [facility]) 11/15/2023   Essential hypertension 02/24/2018   Fever 11/07/2020   Foot ulcer (HCC) 09/27/2020   left foot  Formatting of this note might be different from the original.  Formatting of this note might be different from the original.  left foot     Foot ulcer, left (HCC)  GERD (gastroesophageal reflux disease)    Heart murmur 07/26/2015   Hematuria 07/27/2015   High risk medication use 07/26/2015   History of marijuana use    Hyperkalemia 05/29/2020   Hyperlipidemia 07/26/2015   Hyperlipidemia associated with type 2 diabetes mellitus (HCC) 02/18/2020   Hypertension    Hypertension associated with diabetes (HCC)    Hypoglycemia 05/07/2019   Hypothyroidism 11/14/2015   Hypothyroidism (acquired) 11/14/2015   Insomnia 11/02/2018   Kidney insufficiency    Lumbar spondylosis 03/20/2020   Malaise and fatigue 03/25/2018   Malnutrition of moderate degree 08/20/2023   Mitral  regurgitation 05/12/2019   Moderate aortic regurgitation 05/12/2019   Myocardial infarction due to demand ischemia (HCC) 09/26/2020   a.) Type II NSTEMI; b.) troponins were trended 0.54 --> 0.56 --> 0.52 ng/mL   Non-compliance 05/05/2019   NSTEMI (non-ST elevated myocardial infarction) (HCC) 09/27/2020   Osseous and subluxation stenosis of intervertebral foramina of lumbar region 03/20/2020   Osteoarthritis 10/06/2019   Pancytopenia (HCC) 08/12/2023   Peripheral vascular disease (HCC)    Personal history of tobacco use, presenting hazards to health 09/27/2020   Polyneuropathy in diabetes (HCC) 05/07/2019   Primary osteoarthritis involving multiple joints 10/06/2019   Primary osteoarthritis of right hip 02/21/2020   Pulmonary HTN (HCC)    a.) TTE 05/12/2019: PASP 71; b.) TTE 09/27/2020: PASP >70; c.) TTE 03/21/2021: RVSP 43; d.) TTE 04/24/2022: RVSP 80.6   Puncture wound of right hip 04/02/2018   PVD (peripheral vascular disease) with claudication (HCC)    a.) s/p PTA 03/02/2018 - balloon angioplasty LEFT below knee popliteal artery; b.) s/p PTA 09/30/2022: baloon angioplasty LEFT tibioperoneal trunck, most proximal peroneal artery, and LEFT popliteal artery.   Regional wall motion abnormality of heart 05/07/2019   Right groin pain 02/18/2020   S/P CABG x 4 2003   S/P TAVR (transcatheter aortic valve replacement) 01/20/2024   TAVR with a 29 mm Edwards Sapien 3 Ultra Resilia THV via the TF by Dr. Wendel and Dr. Shyrl   Screening for colon cancer 02/18/2018   Sepsis (HCC) 11/15/2023   Sleep apnea 07/26/2015   a.) does not require nocturnal PAP therapy   Subacute osteomyelitis of left foot (HCC) 03/25/2018   Thrombocytopenia (HCC)    Type 2 diabetes mellitus with hyperlipidemia (HCC) 07/26/2015   Type 2 diabetes mellitus with stage 4 chronic kidney disease, with long-term current use of insulin  (HCC) 07/26/2015   Type II diabetes mellitus with neurological manifestations (HCC)  02/18/2020   Vitamin B12 deficiency 06/16/2019   Vitamin D deficiency 05/07/2018   Past Surgical History:  Procedure Laterality Date   AMPUTATION Left 01/14/2024   Procedure: AMPUTATION, FOOT, PARTIAL;  Surgeon: Serene Gaile ORN, MD;  Location: [facility] OR;  Service: Vascular;  Laterality: Left;   AMPUTATION Left 01/30/2024   Procedure: AMPUTATION BELOW KNEE;  Surgeon: Serene Gaile ORN, MD;  Location: [facility] OR;  Service: Vascular;  Laterality: Left;   AMPUTATION TOE Left 03/13/2018   Procedure: AMPUTATION TOE-MPJ;  Surgeon: Ashley Soulier, DPM;  Location: [facility] ORS;  Service: Podiatry;  Laterality: Left;   ANGIOPLASTY     AV FISTULA PLACEMENT Left 08/20/2023   Procedure: ARTERIOVENOUS (AV) FISTULA CREATION;  Surgeon: Gretta Lonni PARAS, MD;  Location[facility] OR;  Service: Vascular;  Laterality: Left;   CARDIAC CATHETERIZATION     CORONARY ARTERY BYPASS GRAFT N/A 2003   INTRAOPERATIVE TRANSTHORACIC ECHOCARDIOGRAM N/A 01/20/2024   Procedure: ECHOCARDIOGRAM, TRANSTHORACIC;  Surgeon: Wendel Lurena POUR, MD;  Location: [facility] INVASIVE CV LAB;  Service: Cardiovascular;  Laterality: N/A;   IR CATHETER TUBE CHANGE  12/31/2023   IR EXCHANGE BILIARY DRAIN  02/03/2024   IR FLUORO GUIDE CV LINE RIGHT  08/18/2023   IR PERC CHOLECYSTOSTOMY  11/12/2023   IR RADIOLOGIST EVAL & MGMT  12/31/2023   IR US  GUIDE VASC ACCESS RIGHT  08/18/2023   LAPAROSCOPIC APPENDECTOMY N/A 09/08/2018   Procedure: APPENDECTOMY LAPAROSCOPIC;  Surgeon: Jordis Laneta FALCON, MD;  Location: [facility] ORS;  Service: General;  Laterality: N/A;   LOWER EXTREMITY ANGIOGRAPHY Left 03/02/2018   Procedure: LOWER EXTREMITY ANGIOGRAPHY;  Surgeon: Marea Selinda RAMAN, MD;  Location: [facility] INVASIVE CV LAB;  Service: Cardiovascular;  Laterality: Left;   LOWER EXTREMITY ANGIOGRAPHY Left 09/30/2022   Procedure: Lower Extremity Angiography;  Surgeon: Marea Selinda RAMAN, MD;  Location: [facility] INVASIVE CV LAB;  Service: Cardiovascular;  Laterality: Left;   LOWER EXTREMITY ANGIOGRAPHY Left  12/26/2023   Procedure: Lower Extremity Angiography;  Surgeon: Marea Selinda RAMAN, MD;  Location: [facility] INVASIVE CV LAB;  Service: Cardiovascular;  Laterality: Left;   RIGHT/LEFT HEART CATH AND CORONARY/GRAFT ANGIOGRAPHY N/A 08/01/2023   Procedure: RIGHT/LEFT HEART CATH AND CORONARY/GRAFT ANGIOGRAPHY;  Surgeon: Wendel Lurena POUR, MD;  Location: [facility] INVAS[facility] LAB;  Service: Cardiovascular;  Laterality: N/A;   ROTATOR CUFF REPAIR Left    TRANSMETATARSAL AMPUTATION Left 01/16/2024   Procedure: AMPUTATION, FOOT, TRANSMETATARSAL;  Surgeon: Serene Gaile ORN, MD;  Location: [facility] OR;  Service: Vascular;  Laterality: Left;   Family History  Problem Relation Age of Onset   Hyperlipidemia Mother    Hypertension Mother    Diabetes Mother    Heart attack Brother    Heart disease Brother    Lung disease Father    Social History:  reports that he quit smoking about 22 years ago. His smoking use included cigarettes. He started smoking about 47 years ago. He has a 12.5 pack-year smoking history. He has never used smokeless tobacco. He reports that he does not currently use alcohol. He reports that he does not use drugs. Allergies: No Known Allergies Facility-Administered Medications Prior to Admission  Medication Dose Route Frequency Provider Last Rate Last Admin   sodium chloride  flush (NS) 0.9 % injection 10 mL  10 mL Intracatheter Q3 days PRN        Medications Prior to Admission  Medication Sig Dispense Refill   acetaminophen  (TYLENOL ) 500 MG tablet Take 1,000 mg by mouth every 6 (six) hours as needed for moderate pain or headache.      aspirin  EC 81 MG tablet Take 1 tablet (81 mg total) by mouth daily. Swallow whole.     atorvastatin  (LIPITOR) 20 MG tablet Take 1 tablet (20 mg total) by mouth daily. 90 tablet 3   carvedilol  (COREG ) 3.125 MG tablet Take 1 tablet (3.125 mg total) by mouth 2 (two) times daily. 180 tablet 3   clopidogrel  (PLAVIX ) 75 MG tablet Take 1 tablet (75 mg total) by mouth daily. 30 tablet  11   gabapentin  (NEURONTIN ) 300 MG capsule Take 1 capsule (300 mg total) by mouth at bedtime. (Patient taking differently: Take 600 mg by mouth 2 (two) times daily.) 30 capsule 0   insulin  glargine (LANTUS ) 100 UNIT/ML injection Inject 7 Units into the skin daily as needed (Diabetes). Inject 7 units at bedtime as needed after checking glucose     isosorbide  mononitrate (IMDUR ) 120 MG 24 hr tablet Take 1 tablet (120 mg total) by mouth daily. 90 tablet 3   latanoprost  (XALATAN ) 0.005 % ophthalmic  solution Place 1 drop into both eyes at bedtime.     levothyroxine  (SYNTHROID ) 50 MCG tablet Take 50 mcg by mouth daily.     Methoxy PEG-Epoetin Beta (MIRCERA IJ) 30 mcg.     nitroGLYCERIN  (NITROSTAT ) 0.4 MG SL tablet Place 1 tablet (0.4 mg total) under the tongue every 5 (five) minutes as needed for chest pain. 25 tablet 6   sevelamer  carbonate (RENVELA ) 800 MG tablet Take 800 mg by mouth 3 (three) times daily with meals.     tamsulosin  (FLOMAX ) 0.4 MG CAPS capsule Take 0.4 mg by mouth daily after breakfast.     traMADol  (ULTRAM ) 50 MG tablet Take 50 mg by mouth every 8 (eight) hours as needed.     sodium chloride  flush (NS) 0.9 % SOLN Inject 5 mLs into the vein daily for 15 days. flush drain daily with 5 cc NS, record output daily, dressing changes every 2-3 days or earlier if soiled 75 mL 0   sodium chloride  flush 0.9 % SOLN injection 10 mLs by Intracatheter route every 3 (three) days. 100 mL 2      Home: Home Living Family/patient expects to be discharged to:: Private residence Living Arrangements: Spouse/significant other Available Help at Discharge: Family, Available 24 hours/day Type of Home: House Home Access: Stairs to enter [employer] of Steps: 2 Entrance Stairs-Rails: Right, Left, Can reach both Home Layout: One level Bathroom Shower/Tub: Psychologist, counselling, Engineer, manufacturing systems: Handicapped height Bathroom Accessibility: Yes Home Equipment: Agricultural consultant (2  wheels), Shower seat, BSC/3in1, Grab bars - toilet, Other (comment)   Functional History: Prior Function Prior Level of Function : Independent/Modified Independent Mobility Comments: Ambulates with AD. No falls in the past 27mo. ADLs Comments: Ind with ADLs, wife manages medications and assists with IADLs  Functional Status:  Mobility: Bed Mobility Overal bed mobility: Needs Assistance Bed Mobility: Sit to Supine Supine to sit: Contact guard, Supervision, HOB elevated, Used rails Sit to supine: Contact guard assist, Used rails General bed mobility comments: Pt sitting EOB on arrival and departure. Transfers Overall transfer level: Needs assistance Equipment used: Rolling walker (2 wheels) Transfers: Sit to/from Stand, Bed to chair/wheelchair/BSC Sit to Stand: Mod assist Bed to/from chair/wheelchair/BSC transfer type:: Squat pivot Squat pivot transfers: Mod assist Step pivot transfers: Min assist Anterior-Posterior transfers: Contact guard assist  Lateral/Scoot Transfers: Min assist General transfer comment: Mod A with face to face transfer with counting and heavy cueing for sequencing. Pt able to stand fully 1x after 2x attempts with multi modal cues for sequencing pt use of RW and good wgt shifting without the need for assistance toward the R this session. Improved COM over BOS. Once in standing Min A for balance. CUes for safe sitting. Ambulation/Gait Ambulation/Gait assistance: Min assist, Mod assist Gait Distance (Feet): 14 Feet Assistive device: Rolling walker (2 wheels) Gait Pattern/deviations: Step-to pattern, Decreased step length - left, Decreased stance time - left, Decreased weight shift to left, Decreased step length - right, Decreased stride length, Trunk flexed, Leaning posteriorly General Gait Details: unable at this time. Gait velocity: decreased Gait velocity interpretation: <1.31 ft/sec, indicative of household ambulator    ADL: ADL Overall ADL's : Needs  assistance/impaired Eating/Feeding: Set up, Sitting Grooming: Wash/dry face, Oral care, Set up, Sitting Upper Body Bathing: Set up, Sitting Lower Body Bathing: Maximal assistance, Sitting/lateral leans Upper Body Dressing : Set up, Sitting Lower Body Dressing: Maximal assistance, Sitting/lateral leans Lower Body Dressing Details (indicate cue type and reason): pt reaching down to RLE but  unable to complete Toilet Transfer: Minimal assistance, Rolling walker (2 wheels), Stand-pivot, BSC/3in1 Toilet Transfer Details (indicate cue type and reason): dense cues for LLE placement with steps and pt with tendency to point toe to floor despite use of darco shoe Toileting- Clothing Manipulation and Hygiene: Maximal assistance, Total assistance Toileting - Clothing Manipulation Details (indicate cue type and reason): Assist to steady in standing Functional mobility during ADLs: Moderate assistance, Rolling walker (2 wheels) General ADL Comments: Began balance training with ADLs in prep for LLE amputation  Cognition: Cognition Orientation Level: Oriented X4 Cognition Arousal: Alert Behavior During Therapy: WFL for tasks assessed/performed  Physical Exam: Blood pressure 103/60, pulse (!) 59, temperature (!) 97 F (36.1 C), resp. rate 11, height 6' (1.829 m), weight 76.6 kg, SpO2 99%. Physical Exam Skin:    Comments: Left BKA site is dressed with limb guard in place  Neurological:     Comments: Patient is alert.  Makes eye contact with examiner.  Follows simple commands.     Results for orders placed or performed during the hospital encounter of 01/14/24 (from the past 48 hours)  Glucose, capillary     Status: Abnormal   Collection Time: 02/02/24  4:57 PM  Result Value Ref Range   Glucose-Capillary 104 (H) 70 - 99 mg/dL    Comment: Glucose reference range applies only to samples taken after fasting for at least 8 hours.  Glucose, capillary     Status: Abnormal   Collection Time: 02/02/24   8:57 PM  Result Value Ref Range   Glucose-Capillary 128 (H) 70 - 99 mg/dL    Comment: Glucose reference range applies only to samples taken after fasting for at least 8 hours.  Glucose, capillary     Status: None   Collection Time: 02/03/24  6:05 AM  Result Value Ref Range   Glucose-Capillary 90 70 - 99 mg/dL    Comment: Glucose reference range applies only to samples taken after fasting for at least 8 hours.  Glucose, capillary     Status: Abnormal   Collection Time: 02/03/24 11:50 AM  Result Value Ref Range   Glucose-Capillary 162 (H) 70 - 99 mg/dL    Comment: Glucose reference range applies only to samples taken after fasting for at least 8 hours.  Renal function panel     Status: Abnormal   Collection Time: 02/03/24  4:30 PM  Result Value Ref Range   Sodium 128 (L) 135 - 145 mmol/L    Comment: DELTA CHECK NOTED   Potassium 4.6 3.5 - 5.1 mmol/L   Chloride 89 (L) 98 - 111 mmol/L   CO2 22 22 - 32 mmol/L   Glucose, Bld 137 (H) 70 - 99 mg/dL    Comment: Glucose reference range applies only to samples taken after fasting for at least 8 hours.   BUN 62 (H) 8 - 23 mg/dL   Creatinine, Ser 0.91 (H) 0.61 - 1.24 mg/dL   Calcium  8.1 (L) 8.9 - 10.3 mg/dL   Phosphorus 4.4 2.5 - 4.6 mg/dL   Albumin  2.1 (L) 3.5 - 5.0 g/dL   GFR, Estimated 6 (L) >60 mL/min    Comment: (NOTE) Calculated using the CKD-EPI Creatinine Equation (2021)    Anion gap 17 (H) 5 - 15    Comment: Performed at [facility], [address] [place], [place] [address]  CBC     Status: Abnormal   Collection Time: 02/03/24  4:30 PM  Result Value Ref Range   WBC 8.9  4.0 - 10.5 K/uL   RBC 2.67 (L) 4.22 - 5.81 MIL/uL   Hemoglobin 7.9 (L) 13.0 - 17.0 g/dL   HCT 74.2 (L) 60.9 - 47.9 %   MCV 96.3 80.0 - 100.0 fL   MCH 29.6 26.0 - 34.0 pg   MCHC 30.7 30.0 - 36.0 g/dL   RDW 85.0 88.4 - 84.4 %   Platelets 144 (L) 150 - 400 K/uL   nRBC 0.0 0.0 - 0.2 %    Comment: Performed at [facility], [address],  [place], [place] [address]  Glucose, capillary     Status: Abnormal   Collection Time: 02/03/24 10:58 PM  Result Value Ref Range   Glucose-Capillary 108 (H) 70 - 99 mg/dL    Comment: Glucose reference range applies only to samples taken after fasting for at least 8 hours.  Glucose, capillary     Status: Abnormal   Collection Time: 02/04/24  6:31 AM  Result Value Ref Range   Glucose-Capillary 100 (H) 70 - 99 mg/dL    Comment: Glucose reference range applies only to samples taken after fasting for at least 8 hours.   Comment 1 Notify RN    Comment 2 Document in Chart    IR EXCHANGE BILIARY DRAIN Result Date: 02/03/2024 INDICATION: History of acute cholecystitis, post ultrasound fluoroscopic guided cholecystostomy tube placement on 12/31/2023. EXAM: FLUOROSCOPIC GUIDED CHOLECYSTOSTOMY TUBE EXCHANGE COMPARISON:  12/31/2023, 02/02/2024 MEDICATIONS: None ANESTHESIA/SEDATION: None CONTRAST:  20mL OMNIPAQUE  IOHEXOL  300 MG/ML SOLN - administered into the gallbladder lumen. FLUOROSCOPY TIME:  Three mGy reference air kerma COMPLICATIONS: None immediate. PROCEDURE: The patient was positioned supine on the fluoroscopy table. The external portion of the existing cholecystostomy tube as well as the surrounding skin was prepped and draped in usual sterile fashion. A time-out was performed prior to the initiation of the procedure. A preprocedural spot fluoroscopic image was obtained of the right upper abdominal quadrant existing cholecystostomy tube. The skin surrounding the cholecystostomy tube was anesthetized with 1% lidocaine  with epinephrine . The external portion of the cholecystostomy tube was cut and cannulated with a short Amplatz wire which was advanced through the tube and coiled within the gallbladder lumen Next, under intermittent fluoroscopic guidance, the existing 10 French cholecystostomy tube was exchanged for a new 10 [place] cholecystostomy tube which was repositioned into the more central aspect of the  gallbladder lumen. Contrast injection confirms appropriate positioning and functionality of the cholecystomy tube. The cholecystostomy tube was flushed with a small amount of saline and reconnected to a suction bulb. The cholecystostomy tube was secured with an interrupted suture and a Stat Lock device. A dressing was applied. The patient tolerated the procedure well without immediate postprocedural complication. FINDINGS: Preprocedural spot fluoroscopic image demonstrates unchanged positioning of cholecystostomy tube with end coiled and locked over the expected location of the fundus of the gallbladder There was purulent aspirate from the drain. After fluoroscopic guided exchange, the new 10 [place] cholecystostomy tube is more ideally positioned with end coiled and locked within the central aspect of the gallbladder lumen. Post exchange cholangiogram demonstrates appropriate positioning and functionality of the new cholecystostomy tube. IMPRESSION: Successful fluoroscopic guided exchange of 10 Fr percutaneous cholecystostomy tube. PLAN: Keep to bulb suction until purulent output clears, then switch to bag drainage. Ester Sides, MD [facility] Radiology Electronically Signed   By: Ester Sides M.D.   On: 02/03/2024 22:20   CT ABDOMEN PELVIS W CONTRAST Result Date: 02/02/2024 CLINICAL DATA:  Provided history:  purulence from cholecystostomy tube, rule out abscess EXAM: CT ABDOMEN AND PELVIS WITH CONTRAST TECHNIQUE: Multidetector CT imaging of the abdomen and pelvis was performed using the standard protocol following bolus administration of intravenous contrast. RADIATION DOSE REDUCTION: This exam was performed according to the departmental dose-optimization program which includes automated exposure control, adjustment of the mA and/or kV according to patient size and/or use of iterative reconstruction technique. CONTRAST:  75mL OMNIPAQUE  IOHEXOL  350 MG/ML SOLN  COMPARISON:  Most recent abdominal imaging MRI 11/11/2023, abdominopelvic CT 11/06/2020 FINDINGS: Lower chest: 4 mm subpleural left lower lobe nodule, series 4, image 11, unchanged from 2022 and considered definitively benign. No pleural fluid or basilar opacity. Hepatobiliary: Cholecystostomy tube is coiled in the gallbladder. The gallbladder is filled with innumerable gallstones. Soft tissue thickening, stranding, and minimal fluid is seen adjacent to the portion of the tubing outside the gallbladder extending to the subcutaneous tissues. No definite pericholecystic collection. Suggestion of mild pericholecystic fat stranding. Gallbladder wall is not well assessed on the current exam. No common bile duct dilatation. No intrahepatic biliary ductal dilatation. Diffuse hepatic steatosis. No evidence of intrahepatic collection. Pancreas: No ductal dilatation or inflammation. Spleen: Mildly enlarged, 13.5 cm AP. Tiny hypodensity in the inferior spleen is too small to characterize but typically of no clinical significance. Adrenals/Urinary Tract: No adrenal nodule. No hydronephrosis or evidence of renal inflammation. Absent renal excretion on delayed phase imaging. No renal calculi. Partially distended urinary bladder. No bladder wall thickening. Stomach/Bowel: Physiologic appearance of the stomach. There is no small bowel obstruction or inflammation. Moderate formed stool in the colon. Scattered colonic diverticula. No diverticulitis. Appendectomy. Vascular/Lymphatic: Advanced aortic atherosclerosis. No aortic aneurysm. Patent portal and splenic veins. No suspicious lymphadenopathy. Reproductive: Prostate is unremarkable. Other: No free air. No significant ascites. Soft tissue density in gas within the anterior abdominal wall likely related to medication injections. Musculoskeletal: No acute osseous findings. Right hip osteoarthritis. Lumbar degenerative change. Chronic bilateral L5 pars defects. IMPRESSION: 1.  Cholecystostomy tube is coiled in the gallbladder. The gallbladder is filled with innumerable gallstones. Soft tissue thickening, stranding, and minimal fluid is seen adjacent to the portion of the tubing outside the gallbladder extending to the subcutaneous tissues. No definite pericholecystic collection. Suggestion of mild pericholecystic fat stranding. 2. Hepatic steatosis. 3. Mild splenomegaly. 4. Colonic diverticulosis without diverticulitis. 5. Absent renal excretion on delayed phase imaging, consistent with end-stage renal disease. Aortic Atherosclerosis (ICD10-I70.0). Electronically Signed   By: Andrea Gasman M.D.   On: 02/02/2024 19:51      Blood pressure 103/60, pulse (!) 59, temperature (!) 97 F (36.1 C), resp. rate 11, height 6' (1.829 m), weight 76.6 kg, SpO2 99%.  Medical Problem List and Plan: 1. 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  Antithrombotics: -DVT/anticoagulation:  Pharmaceutical: Lovenox   -antiplatelet therapy: Aspirin  81 mg daily, Plavix  75 mg daily 3. Pain Management: Oxycodone  as needed 4. Mood/Behavior/Sleep: Provide emotional support  -antipsychotic agents: N/A 5. Neuropsych/cognition: This patient is capable of making decisions on his own behalf. 6. Skin/Wound Care: Routine skin checks 7. Fluids/Electrolytes/Nutrition: Routine in and out with follow-up chemistries 8.  Aortic stenosis.  Status post TAVR 01/20/2024 9.  End-stage renal disease.  Continue hemodialysis as per renal services 10.  Hyperlipidemia.  Lipitor 11.  Hypertension.  Coreg  3.125 mg twice daily, Imdur  120 mg daily.  Monitor with increased mobility 12.  Hypothyroidism.  Synthroid  13.  Acute on chronic anemia.  Continue Aranesp .  Follow-up CBC 14.  CAD with history of CABG.  No chest pain or shortness of breath.  Continue Imdur  15.  BPH.   Flomax  0.4 mg daily. 16.  Chronic diastolic congestive heart failure.  Monitor for any signs of fluid overload.  Follow-up per cardiology services. 17.  Recent acute cholecystitis June 2025.  Currently maintained on Augmentin  and linezolid  for 3 to 4 weeks and will confirm duration of antibiotic.  Radiology follow-up for percutaneous cholecystostomy tube for recent cholecystitis awaiting plan for continued suction drainage versus change to gravity bag. Toribio PARAS Angiulli, PA-C 02/04/2024

## 2024-02-04 NOTE — Progress Notes (Signed)
 Mobility Specialist Progress Note:    02/04/24 1145  Mobility  Activity Pivoted/transferred from bed to chair  Level of Assistance Minimal assist, patient does 75% or more  Activity Response Tolerated well  Mobility Referral Yes  Mobility visit 1 Mobility  Mobility Specialist Start Time (ACUTE ONLY) 1135  Mobility Specialist Stop Time (ACUTE ONLY) 1145  Mobility Specialist Time Calculation (min) (ACUTE ONLY) 10 min   Pt received in chair, needing to transfer back to bed for dialysis. Lateral scoot with MinA+2. Tolerated well, asx throughout. Lying with all needs met, in care of transport.    Antaniya Venuti Mobility Specialist Please contact via Special educational needs teacher or  Rehab office at [phone]

## 2024-02-04 NOTE — Progress Notes (Addendum)
 AVF on left arm not able to access venous needle. When needle advanced there was a blood clot. Was able to retrograde and treatment started

## 2024-02-04 NOTE — Progress Notes (Signed)
 Inpatient Rehab Admissions Coordinator:   I have insurance approval for CIR from [facility].  I do not have a bed available for this patient to admit today.  Will follow for potential admit pending bed availability, hopeful for the next 1-2 days.   Reche Lowers, PT, DPT Admissions Coordinator [phone] 02/04/24  10:51 AM

## 2024-02-04 NOTE — Progress Notes (Addendum)
 [facility] Progress Note   Subjective:  Seen in room. No new issues. S/p HD last night. No CP/dyspnea. S/p cholecystostomy tube exchange 9/2  Objective Vitals:   02/04/24 0322 02/04/24 0323 02/04/24 0632 02/04/24 0801  BP: 111/76   (!) 110/58  Pulse: 72   79  Resp: 20   16  Temp:  98.6 F (37 C)  98.7 F (37.1 C)  TempSrc:  Oral  Oral  SpO2: 100%   100%  Weight:   76.6 kg   Height:       Physical Exam General: Well appearing man, NAD Heart: RRR Lungs: CTAB Abdomen: soft, R perc chole tube in place, bloody drainage in bulb today Extremities: No edema, L BKA bandaged Dialysis Access: LUE AVF +t/b  Additional Objective Labs: Basic Metabolic Panel: Recent Labs  Lab 02/01/24 0342 02/02/24 0429 02/03/24 1630  NA 132* 136 128*  K 3.9 4.3 4.6  CL 92* 92* 89*  CO2 28 28 22   GLUCOSE 119* 122* 137*  BUN 26* 41* 62*  CREATININE 4.80* 6.87* 9.08*  CALCIUM  8.2* 8.6* 8.1*  PHOS  --   --  4.4   Liver Function Tests: Recent Labs  Lab 02/03/24 1630  ALBUMIN  2.1*   CBC: Recent Labs  Lab 01/30/24 0708 01/30/24 0844 01/31/24 0341 02/01/24 0342 02/03/24 1630  WBC 9.7  --  10.3 10.5 8.9  HGB 9.8*   < > 8.5* 8.8* 7.9*  HCT 32.1*   < > 27.6* 27.9* 25.7*  MCV 97.9  --  96.2 95.9 96.3  PLT 173  --  139* 151 144*   < > = values in this interval not displayed.   Studies/Results: IR EXCHANGE BILIARY DRAIN Result Date: 02/03/2024 INDICATION: History of acute cholecystitis, post ultrasound fluoroscopic guided cholecystostomy tube placement on 12/31/2023. EXAM: FLUOROSCOPIC GUIDED CHOLECYSTOSTOMY TUBE EXCHANGE COMPARISON:  12/31/2023, 02/02/2024 MEDICATIONS: None ANESTHESIA/SEDATION: None CONTRAST:  20mL OMNIPAQUE  IOHEXOL  300 MG/ML SOLN - administered into the gallbladder lumen. FLUOROSCOPY TIME:  Three mGy reference air kerma COMPLICATIONS: None immediate. PROCEDURE: The patient was positioned supine on the fluoroscopy table. The external portion of the existing  cholecystostomy tube as well as the surrounding skin was prepped and draped in usual sterile fashion. A time-out was performed prior to the initiation of the procedure. A preprocedural spot fluoroscopic image was obtained of the right upper abdominal quadrant existing cholecystostomy tube. The skin surrounding the cholecystostomy tube was anesthetized with 1% lidocaine  with epinephrine . The external portion of the cholecystostomy tube was cut and cannulated with a short Amplatz wire which was advanced through the tube and coiled within the gallbladder lumen Next, under intermittent fluoroscopic guidance, the existing 10 French cholecystostomy tube was exchanged for a new 10 [place] cholecystostomy tube which was repositioned into the more central aspect of the gallbladder lumen. Contrast injection confirms appropriate positioning and functionality of the cholecystomy tube. The cholecystostomy tube was flushed with a small amount of saline and reconnected to a suction bulb. The cholecystostomy tube was secured with an interrupted suture and a Stat Lock device. A dressing was applied. The patient tolerated the procedure well without immediate postprocedural complication. FINDINGS: Preprocedural spot fluoroscopic image demonstrates unchanged positioning of cholecystostomy tube with end coiled and locked over the expected location of the fundus of the gallbladder There was purulent aspirate from the drain. After fluoroscopic guided exchange, the new 10 [place] cholecystostomy tube is more ideally positioned with end coiled and locked within the central aspect of the gallbladder lumen. Post  exchange cholangiogram demonstrates appropriate positioning and functionality of the new cholecystostomy tube. IMPRESSION: Successful fluoroscopic guided exchange of 10 Fr percutaneous cholecystostomy tube. PLAN: Keep to bulb suction until purulent output clears, then switch to bag drainage. Ester Sides, MD Vascular and Interventional  Radiology [facility] Radiology Electronically Signed   By: Ester Sides M.D.   On: 02/03/2024 22:20   CT ABDOMEN PELVIS W CONTRAST Result Date: 02/02/2024 CLINICAL DATA:  Provided history: purulence from cholecystostomy tube, rule out abscess EXAM: CT ABDOMEN AND PELVIS WITH CONTRAST TECHNIQUE: Multidetector CT imaging of the abdomen and pelvis was performed using the standard protocol following bolus administration of intravenous contrast. RADIATION DOSE REDUCTION: This exam was performed according to the departmental dose-optimization program which includes automated exposure control, adjustment of the mA and/or kV according to patient size and/or use of iterative reconstruction technique. CONTRAST:  75mL OMNIPAQUE  IOHEXOL  350 MG/ML SOLN COMPARISON:  Most recent abdominal imaging MRI 11/11/2023, abdominopelvic CT 11/06/2020 FINDINGS: Lower chest: 4 mm subpleural left lower lobe nodule, series 4, image 11, unchanged from 2022 and considered definitively benign. No pleural fluid or basilar opacity. Hepatobiliary: Cholecystostomy tube is coiled in the gallbladder. The gallbladder is filled with innumerable gallstones. Soft tissue thickening, stranding, and minimal fluid is seen adjacent to the portion of the tubing outside the gallbladder extending to the subcutaneous tissues. No definite pericholecystic collection. Suggestion of mild pericholecystic fat stranding. Gallbladder wall is not well assessed on the current exam. No common bile duct dilatation. No intrahepatic biliary ductal dilatation. Diffuse hepatic steatosis. No evidence of intrahepatic collection. Pancreas: No ductal dilatation or inflammation. Spleen: Mildly enlarged, 13.5 cm AP. Tiny hypodensity in the inferior spleen is too small to characterize but typically of no clinical significance. Adrenals/Urinary Tract: No adrenal nodule. No hydronephrosis or evidence of renal inflammation. Absent renal excretion on delayed phase imaging. No  renal calculi. Partially distended urinary bladder. No bladder wall thickening. Stomach/Bowel: Physiologic appearance of the stomach. There is no small bowel obstruction or inflammation. Moderate formed stool in the colon. Scattered colonic diverticula. No diverticulitis. Appendectomy. Vascular/Lymphatic: Advanced aortic atherosclerosis. No aortic aneurysm. Patent portal and splenic veins. No suspicious lymphadenopathy. Reproductive: Prostate is unremarkable. Other: No free air. No significant ascites. Soft tissue density in gas within the anterior abdominal wall likely related to medication injections. Musculoskeletal: No acute osseous findings. Right hip osteoarthritis. Lumbar degenerative change. Chronic bilateral L5 pars defects. IMPRESSION: 1. Cholecystostomy tube is coiled in the gallbladder. The gallbladder is filled with innumerable gallstones. Soft tissue thickening, stranding, and minimal fluid is seen adjacent to the portion of the tubing outside the gallbladder extending to the subcutaneous tissues. No definite pericholecystic collection. Suggestion of mild pericholecystic fat stranding. 2. Hepatic steatosis. 3. Mild splenomegaly. 4. Colonic diverticulosis without diverticulitis. 5. Absent renal excretion on delayed phase imaging, consistent with end-stage renal disease. Aortic Atherosclerosis (ICD10-I70.0). Electronically Signed   By: Andrea Gasman M.D.   On: 02/02/2024 19:51   Medications:  sodium chloride       (feeding supplement) PROSource Plus  30 mL Oral BID BM   amoxicillin -clavulanate  1 tablet Oral BID   aspirin  EC  81 mg Oral Daily   atorvastatin   20 mg Oral Daily   carvedilol   3.125 mg Oral BID   clopidogrel   75 mg Oral Daily   [START ON 02/06/2024] darbepoetin (ARANESP ) injection - DIALYSIS  60 mcg Subcutaneous Once   enoxaparin  (LOVENOX ) injection  30 mg Subcutaneous Q24H   isosorbide  mononitrate  120 mg Oral Daily  levothyroxine   50 mcg Oral Daily   linezolid   600 mg Oral  Q12H   sevelamer  carbonate  800 mg Oral TID WC   sodium chloride  flush  3 mL Intravenous Q12H   tamsulosin   0.4 mg Oral QPC breakfast    Dialysis Orders TTS [facility] 4hr, 350/800, EDW 81kg, 2K/2.5Ca, L AVF, no heparin  - no VDRA, ESA ordered but not yet given   Assessment/Plan: 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 Per VVS. Severe aortic stenosis s/p TAVR 01/20/24: Zio monitor in place. ESRD: Continue HD on TTS schedule - > next 9/4. HTN/volume: BP soft, on low dose BB and isosorbide . Below prior EDW - will lower on discharge. Anemia of ESRD: Hgb 7.9 - ESA ordered for today. Secondary HPTH: Ca/Phos ok - continue sevelamer  as binder. Nutrition: Alb low, continue supps. Hx cholecystitis with perc chole drain still in place: Erythema/purulence around drain, on PO abx. S/p CT without abscess, and now s/p tube exchange. T2DM Dispo: Likely needs rehab  Addendum: Called by pt's wife about why he is being dialyzed today - this was not medically necessary, it was an error on my behalf with how it was ordered and it was not caught my the HD team. Wife understands. I spoke to patient and gave him the option of stopping HD now and go back to room and reschedule for tomorrow versus finishing HD today and calling this his Thursday HD - he prefers to do that.  Izetta Boehringer, PA-C 02/04/2024, 10:16 AM  [employer]

## 2024-02-04 NOTE — Progress Notes (Signed)
 Attempted to see patient on the floor this morning to follow up on percutaneous cholecystostomy however patient taken to HD already.  Will attempt to return this afternoon after HD vs tomorrow morning.  Continue current drain care until IR able to assess drain for continued suction drainage vs change to gravity bag.  Clotilda Hesselbach, PA-C

## 2024-02-04 NOTE — Progress Notes (Signed)
 Physical Therapy Treatment Patient Details Name: Phillip [name] MRN: [id number] DOB: 03/22/1956 Today's Date: 02/04/2024   History of Present Illness Phillip [name] is a 68 y.o. male admitted 01/14/24 for left third toe gangrene. Pt s/p left third toe amputation including met head 8/13 and left TMA with wound vac application 8/15. Pt s/p L BKA 01/30/24. Plan CT abdomen 9/1 due to increased purulence around R chole drain. TAVR on 8/19. PMHx: T1DM, PAD s/p angioplasty/PCI to left tibioperoneal trunk on 7/25, ESRD on HD TTS, HTN, chronic HFrEF, severe aortic stenosis, acute cholecystitis requiring cholecystostomy drain 6/25.    PT Comments  Pt is progressing well towards goals. Currently pt is Mod A for squat pivot transfer and Mod A for fully upright standing with RW. Pt with heavy reliance on bil UE during standing for balance and weakness. Pt proprioception has improved significantly since initial evaluation post BKA with improved COM over BOS. Due to pt current functional status, home set up and available assistance at home recommending skilled physical therapy services > 3 hours/day in order to address strength, balance and functional mobility to decrease risk for falls, injury, immobility, skin break down and re-hospitalization.      If plan is discharge home, recommend the following: Assistance with cooking/housework;Assist for transportation;Help with stairs or ramp for entrance;A lot of help with walking and/or transfers   Can travel by private vehicle     No  Equipment Recommendations  Wheelchair cushion (measurements PT);Wheelchair (measurements PT);BSC/3in1;Other (comment)       Precautions / Restrictions Precautions Precautions: Fall;Other (comment) Recall of Precautions/Restrictions: Intact Precaution/Restrictions Comments: R chole drain- abdomen Required Braces or Orthoses: Other Brace Other Brace: limb protector, needs to don for OOB Restrictions Weight Bearing Restrictions  Per Provider Order: No LLE Weight Bearing Per Provider Order: Non weight bearing Other Position/Activity Restrictions: limb guard when transferring OOB or to [facility]     Mobility  Bed Mobility   General bed mobility comments: Pt sitting EOB on arrival and departure.    Transfers Overall transfer level: Needs assistance Equipment used: Rolling walker (2 wheels) Transfers: Sit to/from Stand, Bed to chair/wheelchair/BSC Sit to Stand: Mod assist     Squat pivot transfers: Mod assist     General transfer comment: Mod A with face to face transfer with counting and heavy cueing for sequencing. Pt able to stand fully 1x after 2x attempts with multi modal cues for sequencing pt use of RW and good wgt shifting without the need for assistance toward the R this session. Improved COM over BOS. Once in standing Min A for balance. CUes for safe sitting.    Ambulation/Gait     General Gait Details: unable at this time.     Balance Overall balance assessment: Needs assistance Sitting-balance support: Feet supported Sitting balance-Leahy Scale: Good Sitting balance - Comments: Pt sat Edge of bed   Standing balance support: Bilateral upper extremity supported, During functional activity, Reliant on assistive device for balance Standing balance-Leahy Scale: Poor Standing balance comment: reliant on BUE      Communication Communication Communication: No apparent difficulties  Cognition Arousal: Alert Behavior During Therapy: WFL for tasks assessed/performed   PT - Cognitive impairments: No apparent impairments     Following commands: Intact      Cueing Cueing Techniques: Verbal cues, Gestural cues     General Comments General comments (skin integrity, edema, etc.): HR around 100 bpm throughout session.      Pertinent Vitals/Pain Pain Assessment Pain Assessment: 0-10  Pain Score: 9  Pain Location: R drain,LLE Pain Descriptors / Indicators: Aching, Operative site guarding,  Grimacing, Moaning, Guarding, Sore, Throbbing Pain Intervention(s): Limited activity within patient's tolerance, Monitored during session, Patient requesting pain meds-RN notified     PT Goals (current goals can now be found in the care plan section) Acute Rehab PT Goals Patient Stated Goal: Improve mobility PT Goal Formulation: With patient Time For Goal Achievement: 02/14/24 Potential to Achieve Goals: Fair Progress towards PT goals: Progressing toward goals    Frequency    Min 3X/week      PT Plan  Continue with current POC        AM-PAC PT 6 Clicks Mobility   Outcome Measure  Help needed turning from your back to your side while in a flat bed without using bedrails?: A Little Help needed moving from lying on your back to sitting on the side of a flat bed without using bedrails?: A Little Help needed moving to and from a bed to a chair (including a wheelchair)?: A Lot Help needed standing up from a chair using your arms (e.g., wheelchair or bedside chair)?: A Lot Help needed to walk in hospital room?: Total Help needed climbing 3-5 steps with a railing? : Total 6 Click Score: 12    End of Session Equipment Utilized During Treatment: Gait belt Activity Tolerance: Patient tolerated treatment well Patient left: with call bell/phone within reach;in chair;with chair alarm set Nurse Communication: Mobility status;Patient requests pain meds;Precautions PT Visit Diagnosis: Muscle weakness (generalized) (M62.81);Difficulty in walking, not elsewhere classified (R26.2);Unsteadiness on feet (R26.81);Other abnormalities of gait and mobility (R26.89);Pain Pain - Right/Left: Left Pain - part of body: Leg     Time: 9076-9053 PT Time Calculation (min) (ACUTE ONLY): 23 min  Charges:    $Therapeutic Activity: 23-37 mins PT General Charges $$ ACUTE PT VISIT: 1 Visit                    Dorothyann Maier, DPT, CLT  Acute Rehabilitation Services Office: [phone] (Secure  chat preferred)    Dorothyann VEAR Maier 02/04/2024, 10:04 AM

## 2024-02-05 ENCOUNTER — Encounter (HOSPITAL_COMMUNITY): Payer: Self-pay | Admitting: Physical Medicine and Rehabilitation

## 2024-02-05 ENCOUNTER — Inpatient Hospital Stay (HOSPITAL_COMMUNITY)

## 2024-02-05 ENCOUNTER — Inpatient Hospital Stay (HOSPITAL_COMMUNITY)
Admission: AD | Admit: 2024-02-05 | Discharge: 2024-02-23 | DRG: 559 | Disposition: A | Discharge status: Home Health | Source: Intra-hospital | Attending: Physical Medicine and Rehabilitation | Admitting: Physical Medicine and Rehabilitation

## 2024-02-05 ENCOUNTER — Other Ambulatory Visit: Payer: Self-pay

## 2024-02-05 DIAGNOSIS — Z992 Dependence on renal dialysis: Secondary | ICD-10-CM | POA: Diagnosis not present

## 2024-02-05 DIAGNOSIS — I5042 Chronic combined systolic (congestive) and diastolic (congestive) heart failure: Secondary | ICD-10-CM | POA: Diagnosis present

## 2024-02-05 DIAGNOSIS — I739 Peripheral vascular disease, unspecified: Secondary | ICD-10-CM | POA: Diagnosis not present

## 2024-02-05 DIAGNOSIS — Z7902 Long term (current) use of antithrombotics/antiplatelets: Secondary | ICD-10-CM

## 2024-02-05 DIAGNOSIS — Z95820 Peripheral vascular angioplasty status with implants and grafts: Secondary | ICD-10-CM | POA: Diagnosis not present

## 2024-02-05 DIAGNOSIS — E039 Hypothyroidism, unspecified: Secondary | ICD-10-CM | POA: Diagnosis present

## 2024-02-05 DIAGNOSIS — G4700 Insomnia, unspecified: Secondary | ICD-10-CM | POA: Diagnosis present

## 2024-02-05 DIAGNOSIS — Y838 Other surgical procedures as the cause of abnormal reaction of the patient, or of later complication, without mention of misadventure at the time of the procedure: Secondary | ICD-10-CM | POA: Diagnosis not present

## 2024-02-05 DIAGNOSIS — N186 End stage renal disease: Secondary | ICD-10-CM | POA: Diagnosis present

## 2024-02-05 DIAGNOSIS — T8241XA Breakdown (mechanical) of vascular dialysis catheter, initial encounter: Secondary | ICD-10-CM | POA: Diagnosis not present

## 2024-02-05 DIAGNOSIS — E1142 Type 2 diabetes mellitus with diabetic polyneuropathy: Secondary | ICD-10-CM | POA: Diagnosis present

## 2024-02-05 DIAGNOSIS — Z953 Presence of xenogenic heart valve: Secondary | ICD-10-CM | POA: Diagnosis not present

## 2024-02-05 DIAGNOSIS — Z4781 Encounter for orthopedic aftercare following surgical amputation: Secondary | ICD-10-CM | POA: Diagnosis present

## 2024-02-05 DIAGNOSIS — I2510 Atherosclerotic heart disease of native coronary artery without angina pectoris: Secondary | ICD-10-CM | POA: Diagnosis present

## 2024-02-05 DIAGNOSIS — I5032 Chronic diastolic (congestive) heart failure: Secondary | ICD-10-CM | POA: Diagnosis not present

## 2024-02-05 DIAGNOSIS — D649 Anemia, unspecified: Secondary | ICD-10-CM | POA: Diagnosis not present

## 2024-02-05 DIAGNOSIS — I959 Hypotension, unspecified: Secondary | ICD-10-CM | POA: Diagnosis not present

## 2024-02-05 DIAGNOSIS — G546 Phantom limb syndrome with pain: Secondary | ICD-10-CM | POA: Diagnosis present

## 2024-02-05 DIAGNOSIS — I272 Pulmonary hypertension, unspecified: Secondary | ICD-10-CM | POA: Diagnosis present

## 2024-02-05 DIAGNOSIS — N2581 Secondary hyperparathyroidism of renal origin: Secondary | ICD-10-CM | POA: Diagnosis present

## 2024-02-05 DIAGNOSIS — G548 Other nerve root and plexus disorders: Secondary | ICD-10-CM | POA: Diagnosis not present

## 2024-02-05 DIAGNOSIS — J45909 Unspecified asthma, uncomplicated: Secondary | ICD-10-CM | POA: Diagnosis present

## 2024-02-05 DIAGNOSIS — Z89512 Acquired absence of left leg below knee: Secondary | ICD-10-CM | POA: Diagnosis not present

## 2024-02-05 DIAGNOSIS — I152 Hypertension secondary to endocrine disorders: Secondary | ICD-10-CM | POA: Diagnosis present

## 2024-02-05 DIAGNOSIS — E43 Unspecified severe protein-calorie malnutrition: Secondary | ICD-10-CM | POA: Insufficient documentation

## 2024-02-05 DIAGNOSIS — Z79899 Other long term (current) drug therapy: Secondary | ICD-10-CM

## 2024-02-05 DIAGNOSIS — D631 Anemia in chronic kidney disease: Secondary | ICD-10-CM | POA: Diagnosis present

## 2024-02-05 DIAGNOSIS — Z87891 Personal history of nicotine dependence: Secondary | ICD-10-CM

## 2024-02-05 DIAGNOSIS — K5900 Constipation, unspecified: Secondary | ICD-10-CM | POA: Diagnosis not present

## 2024-02-05 DIAGNOSIS — Z79890 Hormone replacement therapy (postmenopausal): Secondary | ICD-10-CM

## 2024-02-05 DIAGNOSIS — Z794 Long term (current) use of insulin: Secondary | ICD-10-CM | POA: Diagnosis not present

## 2024-02-05 DIAGNOSIS — E1122 Type 2 diabetes mellitus with diabetic chronic kidney disease: Secondary | ICD-10-CM | POA: Diagnosis present

## 2024-02-05 DIAGNOSIS — E1169 Type 2 diabetes mellitus with other specified complication: Secondary | ICD-10-CM | POA: Diagnosis present

## 2024-02-05 DIAGNOSIS — E1151 Type 2 diabetes mellitus with diabetic peripheral angiopathy without gangrene: Secondary | ICD-10-CM | POA: Diagnosis present

## 2024-02-05 DIAGNOSIS — E785 Hyperlipidemia, unspecified: Secondary | ICD-10-CM | POA: Diagnosis present

## 2024-02-05 DIAGNOSIS — Z7982 Long term (current) use of aspirin: Secondary | ICD-10-CM

## 2024-02-05 DIAGNOSIS — K8050 Calculus of bile duct without cholangitis or cholecystitis without obstruction: Secondary | ICD-10-CM

## 2024-02-05 DIAGNOSIS — N400 Enlarged prostate without lower urinary tract symptoms: Secondary | ICD-10-CM | POA: Diagnosis present

## 2024-02-05 DIAGNOSIS — I252 Old myocardial infarction: Secondary | ICD-10-CM

## 2024-02-05 DIAGNOSIS — Z83438 Family history of other disorder of lipoprotein metabolism and other lipidemia: Secondary | ICD-10-CM

## 2024-02-05 DIAGNOSIS — Z952 Presence of prosthetic heart valve: Secondary | ICD-10-CM | POA: Diagnosis not present

## 2024-02-05 DIAGNOSIS — K810 Acute cholecystitis: Secondary | ICD-10-CM | POA: Diagnosis present

## 2024-02-05 DIAGNOSIS — Z833 Family history of diabetes mellitus: Secondary | ICD-10-CM

## 2024-02-05 DIAGNOSIS — Z8249 Family history of ischemic heart disease and other diseases of the circulatory system: Secondary | ICD-10-CM

## 2024-02-05 DIAGNOSIS — I10 Essential (primary) hypertension: Secondary | ICD-10-CM | POA: Diagnosis not present

## 2024-02-05 DIAGNOSIS — I340 Nonrheumatic mitral (valve) insufficiency: Secondary | ICD-10-CM | POA: Diagnosis present

## 2024-02-05 DIAGNOSIS — Z951 Presence of aortocoronary bypass graft: Secondary | ICD-10-CM

## 2024-02-05 DIAGNOSIS — K5901 Slow transit constipation: Secondary | ICD-10-CM | POA: Diagnosis not present

## 2024-02-05 LAB — RENAL FUNCTION PANEL
Albumin: 2.4 g/dL — ABNORMAL LOW (ref 3.5–5.0)
Anion gap: 16 — ABNORMAL HIGH (ref 5–15)
BUN: 25 mg/dL — ABNORMAL HIGH (ref 8–23)
CO2: 28 mmol/L (ref 22–32)
Calcium: 8.9 mg/dL (ref 8.9–10.3)
Chloride: 89 mmol/L — ABNORMAL LOW (ref 98–111)
Creatinine, Ser: 5.44 mg/dL — ABNORMAL HIGH (ref 0.61–1.24)
GFR, Estimated: 11 mL/min — ABNORMAL LOW (ref >60)
Glucose, Bld: 155 mg/dL — ABNORMAL HIGH (ref 70–99)
Phosphorus: 4.5 mg/dL (ref 2.5–4.6)
Potassium: 4.4 mmol/L (ref 3.5–5.1)
Sodium: 133 mmol/L — ABNORMAL LOW (ref 135–145)

## 2024-02-05 LAB — BASIC METABOLIC PANEL WITH GFR
Anion gap: 15 (ref 5–15)
BUN: 18 mg/dL (ref 8–23)
CO2: 28 mmol/L (ref 22–32)
Calcium: 8.8 mg/dL — ABNORMAL LOW (ref 8.9–10.3)
Chloride: 91 mmol/L — ABNORMAL LOW (ref 98–111)
Creatinine, Ser: 4.24 mg/dL — ABNORMAL HIGH (ref 0.61–1.24)
GFR, Estimated: 14 mL/min — ABNORMAL LOW (ref >60)
Glucose, Bld: 118 mg/dL — ABNORMAL HIGH (ref 70–99)
Potassium: 4.4 mmol/L (ref 3.5–5.1)
Sodium: 134 mmol/L — ABNORMAL LOW (ref 135–145)

## 2024-02-05 LAB — CBC
HCT: 31.6 % — ABNORMAL LOW (ref 39.0–52.0)
Hemoglobin: 9.4 g/dL — ABNORMAL LOW (ref 13.0–17.0)
MCH: 28.7 pg (ref 26.0–34.0)
MCHC: 29.7 g/dL — ABNORMAL LOW (ref 30.0–36.0)
MCV: 96.6 fL (ref 80.0–100.0)
Platelets: 168 K/uL (ref 150–400)
RBC: 3.27 MIL/uL — ABNORMAL LOW (ref 4.22–5.81)
RDW: 14.7 % (ref 11.5–15.5)
WBC: 9 K/uL (ref 4.0–10.5)
nRBC: 0 % (ref 0.0–0.2)

## 2024-02-05 LAB — CBC WITH DIFFERENTIAL/PLATELET
Abs Immature Granulocytes: 0.05 K/uL (ref 0.00–0.07)
Basophils Absolute: 0 K/uL (ref 0.0–0.1)
Basophils Relative: 0 %
Eosinophils Absolute: 0.1 K/uL (ref 0.0–0.5)
Eosinophils Relative: 1 %
HCT: 31.3 % — ABNORMAL LOW (ref 39.0–52.0)
Hemoglobin: 9.7 g/dL — ABNORMAL LOW (ref 13.0–17.0)
Immature Granulocytes: 1 %
Lymphocytes Relative: 11 %
Lymphs Abs: 1 K/uL (ref 0.7–4.0)
MCH: 29.8 pg (ref 26.0–34.0)
MCHC: 31 g/dL (ref 30.0–36.0)
MCV: 96.3 fL (ref 80.0–100.0)
Monocytes Absolute: 1 K/uL (ref 0.1–1.0)
Monocytes Relative: 11 %
Neutro Abs: 6.9 K/uL (ref 1.7–7.7)
Neutrophils Relative %: 76 %
Platelets: 156 K/uL (ref 150–400)
RBC: 3.25 MIL/uL — ABNORMAL LOW (ref 4.22–5.81)
RDW: 14.9 % (ref 11.5–15.5)
WBC: 9 K/uL (ref 4.0–10.5)
nRBC: 0 % (ref 0.0–0.2)

## 2024-02-05 LAB — GLUCOSE, CAPILLARY
Glucose-Capillary: 108 mg/dL — ABNORMAL HIGH (ref 70–99)
Glucose-Capillary: 134 mg/dL — ABNORMAL HIGH (ref 70–99)
Glucose-Capillary: 124 mg/dL — ABNORMAL HIGH (ref 70–99)

## 2024-02-05 MED ORDER — TAMSULOSIN HCL 0.4 MG PO CAPS
0.4000 mg | ORAL_CAPSULE | Freq: Every day | ORAL | Status: DC
  Administered 2024-02-06 – 2024-02-10 (×5): 0.4 mg via ORAL
  Filled 2024-02-05 (×5): qty 1

## 2024-02-05 MED ORDER — PENTAFLUOROPROP-TETRAFLUOROETH EX AERO: 1.0000 | INHALATION_SPRAY | CUTANEOUS | Status: DC | PRN

## 2024-02-05 MED ORDER — ANTICOAGULANT SODIUM CITRATE 4% (200MG/5ML) IV SOLN: 5.0000 mL | Status: DC | PRN

## 2024-02-05 MED ORDER — LIDOCAINE HCL (PF) 1 % IJ SOLN: 5.0000 mL | INTRAMUSCULAR | Status: DC | PRN

## 2024-02-05 MED ORDER — LIDOCAINE-PRILOCAINE 2.5-2.5 % EX CREA: 1.0000 | TOPICAL_CREAM | CUTANEOUS | Status: DC | PRN

## 2024-02-05 MED ORDER — HEPARIN SODIUM (PORCINE) 5000 UNIT/ML IJ SOLN
5000.0000 [IU] | Freq: Three times a day (TID) | INTRAMUSCULAR | Status: DC
  Administered 2024-02-05: 5000 [IU] via SUBCUTANEOUS
  Filled 2024-02-05: qty 1

## 2024-02-05 MED ORDER — LINEZOLID 600 MG PO TABS
600.0000 mg | ORAL_TABLET | Freq: Two times a day (BID) | ORAL | Status: DC
  Administered 2024-02-05 – 2024-02-12 (×14): 600 mg via ORAL
  Filled 2024-02-05 (×16): qty 1

## 2024-02-05 MED ORDER — INFLUENZA VAC SPLIT HIGH-DOSE 0.5 ML IM SUSY: 0.5000 mL | PREFILLED_SYRINGE | INTRAMUSCULAR | Status: DC

## 2024-02-05 MED ORDER — NITROGLYCERIN 0.4 MG SL SUBL: 0.4000 mg | SUBLINGUAL_TABLET | SUBLINGUAL | Status: DC | PRN

## 2024-02-05 MED ORDER — ALTEPLASE 2 MG IJ SOLR: 2.0000 mg | Freq: Once | INTRAMUSCULAR | Status: DC | PRN

## 2024-02-05 MED ORDER — LIDOCAINE-PRILOCAINE 2.5-2.5 % EX CREA
1.0000 | TOPICAL_CREAM | CUTANEOUS | Status: DC | PRN
Start: 2024-02-05 — End: 2024-02-05

## 2024-02-05 MED ORDER — LEVOTHYROXINE SODIUM 50 MCG PO TABS
50.0000 ug | ORAL_TABLET | Freq: Every day | ORAL | Status: DC
Start: 2024-02-06 — End: 2024-02-23
  Administered 2024-02-06 – 2024-02-23 (×18): 50 ug via ORAL
  Filled 2024-02-05 (×18): qty 1

## 2024-02-05 MED ORDER — ALBUTEROL SULFATE (2.5 MG/3ML) 0.083% IN NEBU: 2.5000 mg | INHALATION_SOLUTION | RESPIRATORY_TRACT | Status: DC | PRN

## 2024-02-05 MED ORDER — HEPARIN SODIUM (PORCINE) 1000 UNIT/ML DIALYSIS: 1000.0000 [IU] | INTRAMUSCULAR | Status: DC | PRN

## 2024-02-05 MED ORDER — INSULIN GLARGINE 100 UNIT/ML ~~LOC~~ SOLN: 7.0000 [IU] | Freq: Every day | SUBCUTANEOUS | Status: DC

## 2024-02-05 MED ORDER — NEPRO/CARBSTEADY PO LIQD: 237.0000 mL | ORAL | Status: DC | PRN

## 2024-02-05 MED ORDER — HEPARIN SODIUM (PORCINE) 1000 UNIT/ML DIALYSIS: 20.0000 [IU]/kg | INTRAMUSCULAR | Status: DC | PRN

## 2024-02-05 MED ORDER — BISACODYL 10 MG RE SUPP: 10.0000 mg | Freq: Every day | RECTAL | Status: DC | PRN

## 2024-02-05 MED ORDER — CARVEDILOL 3.125 MG PO TABS
3.1250 mg | ORAL_TABLET | Freq: Two times a day (BID) | ORAL | Status: DC
  Administered 2024-02-05 – 2024-02-11 (×11): 3.125 mg via ORAL
  Filled 2024-02-05 (×11): qty 1

## 2024-02-05 MED ORDER — HEPARIN SODIUM (PORCINE) 5000 UNIT/ML IJ SOLN
5000.0000 [IU] | Freq: Three times a day (TID) | INTRAMUSCULAR | Status: DC
  Administered 2024-02-05 – 2024-02-11 (×13): 5000 [IU] via SUBCUTANEOUS
  Filled 2024-02-05 (×16): qty 1

## 2024-02-05 MED ORDER — HEPARIN SODIUM (PORCINE) 5000 UNIT/ML IJ SOLN
5000.0000 [IU] | Freq: Three times a day (TID) | INTRAMUSCULAR | Status: DC
Start: 2024-02-05 — End: 2024-02-05

## 2024-02-05 MED ORDER — ACETAMINOPHEN 650 MG RE SUPP: 650.0000 mg | Freq: Four times a day (QID) | RECTAL | Status: DC | PRN

## 2024-02-05 MED ORDER — BISACODYL 5 MG PO TBEC
5.0000 mg | DELAYED_RELEASE_TABLET | Freq: Every day | ORAL | Status: DC | PRN
  Administered 2024-02-10 – 2024-02-18 (×2): 5 mg via ORAL
  Filled 2024-02-05 (×2): qty 1

## 2024-02-05 MED ORDER — PROSOURCE PLUS PO LIQD
30.0000 mL | Freq: Two times a day (BID) | ORAL | Status: DC
  Administered 2024-02-06 – 2024-02-14 (×12): 30 mL via ORAL
  Filled 2024-02-05 (×17): qty 30

## 2024-02-05 MED ORDER — LINEZOLID 600 MG PO TABS: 600.0000 mg | ORAL_TABLET | Freq: Two times a day (BID) | ORAL | Status: DC

## 2024-02-05 MED ORDER — ATORVASTATIN CALCIUM 10 MG PO TABS
20.0000 mg | ORAL_TABLET | Freq: Every day | ORAL | Status: DC
  Administered 2024-02-05 – 2024-02-22 (×17): 20 mg via ORAL
  Filled 2024-02-05 (×17): qty 2

## 2024-02-05 MED ORDER — ACETAMINOPHEN 325 MG PO TABS
650.0000 mg | ORAL_TABLET | Freq: Four times a day (QID) | ORAL | Status: DC | PRN
  Administered 2024-02-06 – 2024-02-23 (×14): 650 mg via ORAL
  Filled 2024-02-05 (×12): qty 2

## 2024-02-05 MED ORDER — LIDOCAINE HCL (PF) 1 % IJ SOLN
5.0000 mL | INTRAMUSCULAR | Status: DC | PRN
Start: 2024-02-05 — End: 2024-02-05

## 2024-02-05 MED ORDER — CLOPIDOGREL BISULFATE 75 MG PO TABS
75.0000 mg | ORAL_TABLET | Freq: Every day | ORAL | Status: DC
  Administered 2024-02-05 – 2024-02-22 (×16): 75 mg via ORAL
  Filled 2024-02-05 (×17): qty 1

## 2024-02-05 MED ORDER — BISACODYL 5 MG PO TBEC
5.0000 mg | DELAYED_RELEASE_TABLET | Freq: Every day | ORAL | 0 refills | Status: DC | PRN
Start: 2024-02-05 — End: 2024-02-23

## 2024-02-05 MED ORDER — AMOXICILLIN-POT CLAVULANATE 500-125 MG PO TABS
1.0000 | ORAL_TABLET | Freq: Two times a day (BID) | ORAL | Status: DC
  Administered 2024-02-05: 1 via ORAL
  Filled 2024-02-05 (×2): qty 1

## 2024-02-05 MED ORDER — OXYCODONE HCL 5 MG PO TABS
5.0000 mg | ORAL_TABLET | ORAL | Status: DC | PRN
  Administered 2024-02-06: 5 mg via ORAL
  Administered 2024-02-06 (×2): 10 mg via ORAL
  Administered 2024-02-06 (×2): 5 mg via ORAL
  Administered 2024-02-07 – 2024-02-12 (×21): 10 mg via ORAL
  Filled 2024-02-05 (×16): qty 2
  Filled 2024-02-05: qty 1
  Filled 2024-02-05 (×9): qty 2

## 2024-02-05 MED ORDER — ISOSORBIDE MONONITRATE ER 60 MG PO TB24
120.0000 mg | ORAL_TABLET | Freq: Every day | ORAL | Status: DC
  Administered 2024-02-05 – 2024-02-09 (×5): 120 mg via ORAL
  Filled 2024-02-05 (×5): qty 2

## 2024-02-05 MED ORDER — SEVELAMER CARBONATE 800 MG PO TABS
800.0000 mg | ORAL_TABLET | Freq: Three times a day (TID) | ORAL | Status: DC
  Administered 2024-02-06 – 2024-02-22 (×47): 800 mg via ORAL
  Filled 2024-02-05 (×48): qty 1

## 2024-02-05 MED ORDER — ASPIRIN 81 MG PO TBEC
81.0000 mg | DELAYED_RELEASE_TABLET | Freq: Every day | ORAL | Status: DC
  Administered 2024-02-05 – 2024-02-22 (×16): 81 mg via ORAL
  Filled 2024-02-05 (×18): qty 1

## 2024-02-05 MED ORDER — AMOXICILLIN-POT CLAVULANATE 500-125 MG PO TABS: 1.0000 | ORAL_TABLET | Freq: Two times a day (BID) | ORAL | Status: DC

## 2024-02-05 NOTE — Discharge Instructions (Addendum)
 Inpatient Rehab Discharge Instructions  Phillip [name] Discharge date and time: No discharge date for patient encounter.   Activities/Precautions/ Functional Status: Activity: As tolerated Diet: Renal diet Wound Care: Routine skin checks Functional status:  ___ No restrictions     ___ Walk up steps independently ___ 24/7 supervision/assistance   ___ Walk up steps with assistance ___ Intermittent supervision/assistance  ___ Bathe/dress independently ___ Walk with walker     _x__ Bathe/dress with assistance ___ Walk Independently    ___ Shower independently ___ Walk with assistance    ___ Shower with assistance ___ No alcohol     ___ Return to work/school ________  Special Instructions: No driving smoking or alcohol COMMUNITY REFERRALS UPON DISCHARGE:    Home Health:   PT     OT                     Agency:[facility] Phone: [phone]   Medical Equipment/Items Ordered:Shower Transfer Bench, Standard Wheelchair, 18 in x 18 in, Drop Arm Commode                                                 Agency/Supplier: [employer]    My questions have been answered and I understand these instructions. I will adhere to these goals and the provided educational materials after my discharge from the hospital.  Patient/Caregiver Signature _______________________________ Date __________  Clinician Signature _______________________________________ Date __________  Please bring this form and your medication list with you to all your follow-up doctor's appointments.

## 2024-02-05 NOTE — Progress Notes (Signed)
 [facility] Progress Note   Subjective:  Seen in room - feels well. Accidentally dialyzed yesterday instead of today - did well with it. No HD today, will pick back up his usual schedule on Saturday. No CP/dyspnea. Looks like will move to CIR today per notes.  Objective Vitals:   02/04/24 2227 02/04/24 2332 02/05/24 0443 02/05/24 0846  BP: 115/82 (!) 103/59 106/61 125/73  Pulse: 93 85 79   Resp:  14 18 14   Temp:  98.7 F (37.1 C) 98.7 F (37.1 C) 98 F (36.7 C)  TempSrc:  Oral Oral Oral  SpO2:  97% 100%   Weight:   70.3 kg   Height:       Physical Exam General: Well appearing man, NAD Heart: RRR Lungs: CTAB Abdomen: soft, R perc chole tube in place, bloody drainage in bulb today Extremities: No edema, L BKA bandaged Dialysis Access: LUE AVF +t/b  Additional Objective Labs: Basic Metabolic Panel: Recent Labs  Lab 02/02/24 0429 02/03/24 1630 02/05/24 0650  NA 136 128* 134*  K 4.3 4.6 4.4  CL 92* 89* 91*  CO2 28 22 28   GLUCOSE 122* 137* 118*  BUN 41* 62* 18  CREATININE 6.87* 9.08* 4.24*  CALCIUM  8.6* 8.1* 8.8*  PHOS  --  4.4  --    Liver Function Tests: Recent Labs  Lab 02/03/24 1630  ALBUMIN  2.1*   CBC: Recent Labs  Lab 01/30/24 0708 01/30/24 0844 01/31/24 0341 02/01/24 0342 02/03/24 1630 02/05/24 0650  WBC 9.7  --  10.3 10.5 8.9 9.0  NEUTROABS  --   --   --   --   --  6.9  HGB 9.8*   < > 8.5* 8.8* 7.9* 9.7*  HCT 32.1*   < > 27.6* 27.9* 25.7* 31.3*  MCV 97.9  --  96.2 95.9 96.3 96.3  PLT 173  --  139* 151 144* 156   < > = values in this interval not displayed.   Studies/Results: IR EXCHANGE BILIARY DRAIN Result Date: 02/03/2024 INDICATION: History of acute cholecystitis, post ultrasound fluoroscopic guided cholecystostomy tube placement on 12/31/2023. EXAM: FLUOROSCOPIC GUIDED CHOLECYSTOSTOMY TUBE EXCHANGE COMPARISON:  12/31/2023, 02/02/2024 MEDICATIONS: None ANESTHESIA/SEDATION: None CONTRAST:  20mL OMNIPAQUE  IOHEXOL  300 MG/ML SOLN  - administered into the gallbladder lumen. FLUOROSCOPY TIME:  Three mGy reference air kerma COMPLICATIONS: None immediate. PROCEDURE: The patient was positioned supine on the fluoroscopy table. The external portion of the existing cholecystostomy tube as well as the surrounding skin was prepped and draped in usual sterile fashion. A time-out was performed prior to the initiation of the procedure. A preprocedural spot fluoroscopic image was obtained of the right upper abdominal quadrant existing cholecystostomy tube. The skin surrounding the cholecystostomy tube was anesthetized with 1% lidocaine  with epinephrine . The external portion of the cholecystostomy tube was cut and cannulated with a short Amplatz wire which was advanced through the tube and coiled within the gallbladder lumen Next, under intermittent fluoroscopic guidance, the existing 10 French cholecystostomy tube was exchanged for a new 10 [place] cholecystostomy tube which was repositioned into the more central aspect of the gallbladder lumen. Contrast injection confirms appropriate positioning and functionality of the cholecystomy tube. The cholecystostomy tube was flushed with a small amount of saline and reconnected to a suction bulb. The cholecystostomy tube was secured with an interrupted suture and a Stat Lock device. A dressing was applied. The patient tolerated the procedure well without immediate postprocedural complication. FINDINGS: Preprocedural spot fluoroscopic image demonstrates unchanged positioning of cholecystostomy  tube with end coiled and locked over the expected location of the fundus of the gallbladder There was purulent aspirate from the drain. After fluoroscopic guided exchange, the new 10 [place] cholecystostomy tube is more ideally positioned with end coiled and locked within the central aspect of the gallbladder lumen. Post exchange cholangiogram demonstrates appropriate positioning and functionality of the new cholecystostomy  tube. IMPRESSION: Successful fluoroscopic guided exchange of 10 Fr percutaneous cholecystostomy tube. PLAN: Keep to bulb suction until purulent output clears, then switch to bag drainage. Ester Sides, MD [facility] Electronically Signed   By: Ester Sides M.D.   On: 02/03/2024 22:20   Medications:  sodium chloride       (feeding supplement) PROSource Plus  30 mL Oral BID BM   amoxicillin -clavulanate  1 tablet Oral BID   aspirin  EC  81 mg Oral Daily   atorvastatin   20 mg Oral Daily   carvedilol   3.125 mg Oral BID   clopidogrel   75 mg Oral Daily   heparin  injection (subcutaneous)  5,000 Units Subcutaneous Q8H   isosorbide  mononitrate  120 mg Oral Daily   levothyroxine   50 mcg Oral Daily   linezolid   600 mg Oral Q12H   sevelamer  carbonate  800 mg Oral TID WC   sodium chloride  flush  3 mL Intravenous Q12H   tamsulosin   0.4 mg Oral QPC breakfast    Dialysis Orders TTS [facility] 4hr, 350/800, EDW 81kg, 2K/2.5Ca, L AVF, no heparin  - no VDRA, ESA ordered but not yet given   Assessment/Plan: 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 Per [facility]. Severe aortic stenosis s/p TAVR 01/20/24: Zio monitor in place. ESRD: Dialyzed off schedule this week on Tues, then Wed -> next HD Saturday. HTN/volume: BP soft, on low dose BB and isosorbide . Below prior EDW - will lower on discharge. Anemia of ESRD: Hgb 9.7, s/p Aranesp  on 9/3 - monitor. Secondary HPTH: Ca/Phos ok - continue sevelamer  as binder. Nutrition: Alb low, continue supps. Hx cholecystitis with perc chole drain still in place: Erythema/purulence around drain, on PO abx. S/p CT without abscess, and now s/p tube exchange. T2DM Dispo: Will be moving to CIR today per notes.   Izetta Boehringer, PA-C 02/05/2024, 10:47 AM  [employer]

## 2024-02-05 NOTE — Plan of Care (Signed)
  Problem: Consults Goal: RH LIMB LOSS PATIENT EDUCATION Description: Description: See Patient Education module for eduction specifics. Outcome: Progressing   Problem: RH BOWEL ELIMINATION Goal: RH STG MANAGE BOWEL WITH ASSISTANCE Description: STG Manage Bowel with mod I Assistance. Outcome: Progressing   Problem: RH SKIN INTEGRITY Goal: RH STG SKIN FREE OF INFECTION/BREAKDOWN Outcome: Progressing   Problem: RH SAFETY Goal: RH STG ADHERE TO SAFETY PRECAUTIONS W/ASSISTANCE/DEVICE Description: STG Adhere to Safety Precautions With mod I Assistance/Device. Outcome: Progressing   Problem: RH PAIN MANAGEMENT Goal: RH STG PAIN MANAGED AT OR BELOW PT'S PAIN GOAL Description: <4 w/ prns Outcome: Progressing   Problem: RH KNOWLEDGE DEFICIT LIMB LOSS Goal: RH STG INCREASE KNOWLEDGE OF SELF CARE AFTER LIMB LOSS Description: Manage increase knowledge of self carte after limb loss with mod I assistance from wife using educational materials provided Outcome: Progressing

## 2024-02-05 NOTE — H&P (Signed)
 Physical Medicine and Rehabilitation Admission H&P       HPI: Phillip [name] is a 68 year old right-handed male with history significant for end-stage renal disease with hemodialysis Tuesday Thursday Saturday, hyperlipidemia, diabetes mellitus, hypertension, bradycardia, CAD with CABG 2003 maintained on aspirin  as well as Plavix ., recent acute cholecystitis June 2025 requiring cholecystostomy tube placement, anemia of chronic disease, BPH, chronic diastolic congestive heart failure, aortic stenosis (TAVR postponed due to left third toe gangrene), PAD status post PCI to left tibia fibula/angioplasty 7/25 as well as left toe amputation 2019 .  Per chart review patient lives with spouse.  1 level home 2 steps to enter.  Ambulates with assist device.  Independent with ADLs.  Presented 01/14/2024 for elective left third toe amputation due to nonhealing ulcer with gangrenous changes.  Noninvasive vascular study showed noncompressible vessels hence underwent an angiogram of left lower extremity with transluminal angioplasty/stent placement to the left tibial peroneal trunk on 7/25 at [facility]  Underwent left third toe amputation including metatarsal head 01/14/2024 per Dr. Serene.  [facility] course nephrology consulted with dialysis ongoing as directed.  Due to nonhealing left toe wound underwent left transmetatarsal amputation 01/16/2024 and wound VAC applied and removed 01/23/2024.  Follow-up cardiology services for severe aortic stenosis and recent delay in TAVR procedure due to left toe gangrene and was cleared for surgery undergoing TAVR 01/20/2024 per Dr. Shyrl.  Close monitoring of recent left TMA with poor healing and limb was not felt to be salvageable undergoing left below-knee amputation 01/30/2024 per Dr. Serene and limb guard applied.  Acute on chronic anemia latest hemoglobin 7.9.  Patient remains on aspirin  and Plavix  as prior to admission.  SQ heparin  added for DVT prophylaxis.  Radiology service  follow-up for percutaneous cholecystostomy tube from recent acute cholecystitis June 2025 and currently remains on Augmentin  and linezolid  for 3 to 4 weeks.  Patient has remained afebrile with no leukocytosis some erythema around the percutaneous biliary drain site with some purulent drainage soaking the gauze.  CT of the abdomen pelvis showed some soft tissue thickening and stranding with minimal fluid seen adjacent to the portion of the tubing.  No peri-cholecystic collection and awaiting plan to assess for continued suction drainage versus change to gravity bag.  Therapy evaluations completed due to patient decreased functional mobility was admitted for a comprehensive rehab program   Review of Systems  Constitutional:  Negative for chills and fever.  HENT:  Negative for hearing loss.   Eyes:  Negative for blurred vision and double vision.  Respiratory:  Negative for cough and wheezing.        Short of breath with heavy exertion  Cardiovascular:  Positive for palpitations and leg swelling. Negative for chest pain.  Gastrointestinal:  Positive for constipation. Negative for heartburn, nausea and vomiting.       GERD  Genitourinary:  Negative for dysuria, flank pain and hematuria.  Musculoskeletal:  Positive for back pain, joint pain and myalgias.  Skin:  Negative for rash.  Neurological:  Positive for weakness.  All other systems reviewed and are negative.      Past Medical History:  Diagnosis Date   Abdominopelvic abscess (HCC) 09/29/2018   Abscess of appendix 09/22/2018   Acquired spondylolisthesis of lumbosacral region 03/20/2020   Acute cholecystitis 11/15/2023   Acute kidney injury (HCC) 10/07/2018   Acute kidney injury superimposed on chronic kidney disease (HCC) 11/06/2020   Acute kidney injury superimposed on CKD (HCC) 02/18/2020   Acute on chronic systolic (congestive) heart  failure (HCC) 07/26/2015    Formatting of this note might be different from the original. Formatting of  this note might be different from the original. Formatting of this note might be different from the original. revenkar EF 35% Formatting of this note might be different from the original. revenkar EF 35%     Acute on chronic systolic CHF (congestive heart failure) (HCC) 08/12/2023   Anasarca 07/01/2023   Anemia of chronic disease 08/18/2019   Anemia, chronic disease 08/18/2019   Aortic atherosclerosis (HCC)     Aortic stenosis      a.) TTE 09/15/2018: mild-mod (MPG 19); b.) TTE 03/21/2021: mild-mod (MPG 14.3); c.) TTE 04/24/2022: mild- mod (MPG 15)   Aortic stenosis, moderate 10/15/2019   Appendicitis 09/08/2018   ARF (acute renal failure) (HCC) 02/19/2020   Asthma     Atherosclerosis of native arteries of the extremities with ulceration (HCC) 02/24/2018    Formatting of this note might be different from the original.  Last Assessment & Plan:   His ABIs today are 1.07 on the right and 0.99 on the left with biphasic waveforms.  Although these pressures may be somewhat elevated from medial calcification, his flow does appear to be reasonably good.  His left ABI was 0.58 prior to intervention.  At this point, we will stretch out his follow-up and see hi   Benign hypertension with CKD (chronic kidney disease) stage IV (HCC) 02/24/2018   Benign prostatic hyperplasia without lower urinary tract symptoms 01/14/2018   Bilateral lower extremity edema 11/04/2018   Bradycardia 03/25/2018   Bruit of right carotid artery 07/26/2015   CAD (coronary artery disease) 2003    a.) s/p 4v CABG 2003   Cardiac murmur 07/26/2015   Choledocholithiasis 11/12/2023   Chronic combined systolic and diastolic CHF (congestive heart failure) (HCC) 07/26/2015    a.) TTE 09/15/2018: EF 50-55%, mod LVH, RVE, BAE, mild-mod TR, AoV sclerosis, G1DD; b.) MPI 10/27/2019: EF <30%; c.) TTE 03/21/2021: EF 35-40%, post AK, inf HK, mod LVH, mod red RVSF, mod LAE, mod Aov sclerosis, G2DD; c.) TTE 04/24/2022: EF 35-40%, post AK, glok HK,  mod LVE, mod red RVSF, mild-mod MR, Aov sclerosis, G3DD   Chronic idiopathic constipation 11/02/2018   Chronic low back pain without sciatica 12/02/2018   Chronic pain of right hip 03/31/2020   Chronic systolic heart failure (HCC) 07/26/2015    Formatting of this note might be different from the original. revenkar EF 35%     CKD (chronic kidney disease) stage 4, GFR 15-29 ml/min (HCC) 10/07/2018   Congestive heart failure (HCC) 07/26/2015    Last Assessment & Plan:   Poor cardiac function could certainly be contributing to poor blood supply to the feet and toes as well.     Formatting of this note might be different from the original.  Formatting of this note might be different from the original.  Last Assessment & Plan:   Poor cardiac function could certainly be contributing to poor blood supply to the feet and toes as well.     Last   Coronary artery disease     Diabetes mellitus without complication (HCC)     Dysphagia 07/26/2015   Encounter for long-term (current) use of insulin  (HCC) 09/27/2020   Erectile dysfunction 07/14/2017   ESRD on dialysis [facility]) 11/15/2023   Essential hypertension 02/24/2018   Fever 11/07/2020   Foot ulcer (HCC) 09/27/2020    left foot  Formatting of this note might be different from the original.  Formatting of this note might be different from the original.  left foot     Foot ulcer, left (HCC)     GERD (gastroesophageal reflux disease)     Heart murmur 07/26/2015   Hematuria 07/27/2015   High risk medication use 07/26/2015   History of marijuana use     Hyperkalemia 05/29/2020   Hyperlipidemia 07/26/2015   Hyperlipidemia associated with type 2 diabetes mellitus (HCC) 02/18/2020   Hypertension     Hypertension associated with diabetes (HCC)     Hypoglycemia 05/07/2019   Hypothyroidism 11/14/2015   Hypothyroidism (acquired) 11/14/2015   Insomnia 11/02/2018   Kidney insufficiency     Lumbar spondylosis 03/20/2020   Malaise and fatigue 03/25/2018    Malnutrition of moderate degree 08/20/2023   Mitral regurgitation 05/12/2019   Moderate aortic regurgitation 05/12/2019   Myocardial infarction due to demand ischemia (HCC) 09/26/2020    a.) Type II NSTEMI; b.) troponins were trended 0.54 --> 0.56 --> 0.52 ng/mL   Non-compliance 05/05/2019   NSTEMI (non-ST elevated myocardial infarction) (HCC) 09/27/2020   Osseous and subluxation stenosis of intervertebral foramina of lumbar region 03/20/2020   Osteoarthritis 10/06/2019   Pancytopenia (HCC) 08/12/2023   Peripheral vascular disease (HCC)     Personal history of tobacco use, presenting hazards to health 09/27/2020   Polyneuropathy in diabetes (HCC) 05/07/2019   Primary osteoarthritis involving multiple joints 10/06/2019   Primary osteoarthritis of right hip 02/21/2020   Pulmonary HTN (HCC)      a.) TTE 05/12/2019: PASP 71; b.) TTE 09/27/2020: PASP >70; c.) TTE 03/21/2021: RVSP 43; d.) TTE 04/24/2022: RVSP 80.6   Puncture wound of right hip 04/02/2018   PVD (peripheral vascular disease) with claudication (HCC)      a.) s/p PTA 03/02/2018 - balloon angioplasty LEFT below knee popliteal artery; b.) s/p PTA 09/30/2022: baloon angioplasty LEFT tibioperoneal trunck, most proximal peroneal artery, and LEFT popliteal artery.   Regional wall motion abnormality of heart 05/07/2019   Right groin pain 02/18/2020   S/P CABG x 4 2003   S/P TAVR (transcatheter aortic valve replacement) 01/20/2024    TAVR with a 29 mm Edwards Sapien 3 Ultra Resilia THV via the TF by Dr. Wendel and Dr. Shyrl   Screening for colon cancer 02/18/2018   Sepsis (HCC) 11/15/2023   Sleep apnea 07/26/2015    a.) does not require nocturnal PAP therapy   Subacute osteomyelitis of left foot (HCC) 03/25/2018   Thrombocytopenia (HCC)     Type 2 diabetes mellitus with hyperlipidemia (HCC) 07/26/2015   Type 2 diabetes mellitus with stage 4 chronic kidney disease, with long-term current use of insulin  (HCC) 07/26/2015   Type II  diabetes mellitus with neurological manifestations (HCC) 02/18/2020   Vitamin B12 deficiency 06/16/2019   Vitamin D deficiency 05/07/2018             Past Surgical History:  Procedure Laterality Date   AMPUTATION Left 01/14/2024    Procedure: AMPUTATION, FOOT, PARTIAL;  Surgeon: Serene Gaile ORN, MD;  Location: [facility] OR;  Service: Vascular;  Laterality: Left;   AMPUTATION Left 01/30/2024    Procedure: AMPUTATION BELOW KNEE;  Surgeon: Serene Gaile ORN, MD;  Location: [facility] OR;  Service: Vascular;  Laterality: Left;   AMPUTATION TOE Left 03/13/2018    Procedure: AMPUTATION TOE-MPJ;  Surgeon: Ashley Soulier, DPM;  Location: [facility] ORS;  Service: Podiatry;  Laterality: Left;   ANGIOPLASTY       AV FISTULA PLACEMENT Left 08/20/2023    Procedure: ARTERIOVENOUS (AV) FISTULA  CREATION;  Surgeon: Gretta Lonni PARAS, MD;  Location: [facility] OR;  Service: Vascular;  Laterality: Left;   CARDIAC CATHETERIZATION       CORONARY ARTERY BYPASS GRAFT N/A 2003   INTRAOPERATIVE TRANSTHORACIC ECHOCARDIOGRAM N/A 01/20/2024    Procedure: ECHOCARDIOGRAM, TRANSTHORACIC;  Surgeon: Wendel Lurena POUR, MD;  Location: [facility];  Service: Cardiovascular;  Laterality: N/A;   IR CATHETER TUBE CHANGE   12/31/2023   IR EXCHANGE BILIARY DRAIN   02/03/2024   IR FLUORO GUIDE CV LINE RIGHT   08/18/2023   IR PERC CHOLECYSTOSTOMY   11/12/2023   IR RADIOLOGIST EVAL & MGMT   12/31/2023   IR US  GUIDE VASC ACCESS RIGHT   08/18/2023   LAPAROSCOPIC APPENDECTOMY N/A 09/08/2018    Procedure: APPENDECTOMY LAPAROSCOPIC;  Surgeon: Jordis Laneta FALCON, MD;  Location: [facility] ORS;  Service: General;  Laterality: N/A;   LOWER EXTREMITY ANGIOGRAPHY Left 03/02/2018    Procedure: LOWER EXTREMITY ANGIOGRAPHY;  Surgeon: Marea Selinda RAMAN, MD;  Location: [facility] IN[facility];  Service: Cardiovascular;  Laterality: Left;   LOWER EXTREMITY ANGIOGRAPHY Left 09/30/2022    Procedure: Lower Extremity Angiography;  Surgeon: Marea Selinda RAMAN, MD;  Location: [facility];   Service: Cardiovascular;  Laterality: Left;   LOWER EXTREMITY ANGIOGRAPHY Left 12/26/2023    Procedure: Lower Extremity Angiography;  Surgeon: Marea Selinda RAMAN, MD;  Location: [facility];  Service: Cardiovascular;  Laterality: Left;   RIGHT/LEFT HEART CATH AND CORONARY/GRAFT ANGIOGRAPHY N/A 08/01/2023    Procedure: RIGHT/LEFT HEART CATH AND CORONARY/GRAFT ANGIOGRAPHY;  Surgeon: Wendel Lurena POUR, MD;  Location: [facility];  Service: Cardiovascular;  Laterality: N/A;   ROTATOR CUFF REPAIR Left     TRANSMETATARSAL AMPUTATION Left 01/16/2024    Procedure: AMPUTATION, FOOT, TRANSMETATARSAL;  Surgeon: Serene Gaile ORN, MD;  Location: [facility] OR;  Service: Vascular;  Laterality: Left;             Family History  Problem Relation Age of Onset   Hyperlipidemia Mother     Hypertension Mother     Diabetes Mother     Heart attack Brother     Heart disease Brother     Lung disease Father          Social History:  reports that he quit smoking about 22 years ago. His smoking use included cigarettes. He started smoking about 47 years ago. He has a 12.5 pack-year smoking history. He has never used smokeless tobacco. He reports that he does not currently use alcohol. He reports that he does not use drugs. Allergies:  Allergies  No Known Allergies            Facility-Administered Medications Prior to Admission  Medication Dose Route Frequency Provider Last Rate Last Admin   sodium chloride  flush (NS) 0.9 % injection 10 mL  10 mL Intracatheter Q3 days PRN                  Medications Prior to Admission  Medication Sig Dispense Refill   acetaminophen  (TYLENOL ) 500 MG tablet Take 1,000 mg by mouth every 6 (six) hours as needed for moderate pain or headache.        aspirin  EC 81 MG tablet Take 1 tablet (81 mg total) by mouth daily. Swallow whole.       atorvastatin  (LIPITOR) 20 MG tablet Take 1 tablet (20 mg total) by mouth daily. 90 tablet 3   carvedilol  (COREG ) 3.125 MG tablet Take 1 tablet  (3.125  mg total) by mouth 2 (two) times daily. 180 tablet 3   clopidogrel  (PLAVIX ) 75 MG tablet Take 1 tablet (75 mg total) by mouth daily. 30 tablet 11   gabapentin  (NEURONTIN ) 300 MG capsule Take 1 capsule (300 mg total) by mouth at bedtime. (Patient taking differently: Take 600 mg by mouth 2 (two) times daily.) 30 capsule 0   insulin  glargine (LANTUS ) 100 UNIT/ML injection Inject 7 Units into the skin daily as needed (Diabetes). Inject 7 units at bedtime as needed after checking glucose       isosorbide  mononitrate (IMDUR ) 120 MG 24 hr tablet Take 1 tablet (120 mg total) by mouth daily. 90 tablet 3   latanoprost  (XALATAN ) 0.005 % ophthalmic solution Place 1 drop into both eyes at bedtime.       levothyroxine  (SYNTHROID ) 50 MCG tablet Take 50 mcg by mouth daily.       Methoxy PEG-Epoetin Beta (MIRCERA IJ) 30 mcg.       nitroGLYCERIN  (NITROSTAT ) 0.4 MG SL tablet Place 1 tablet (0.4 mg total) under the tongue every 5 (five) minutes as needed for chest pain. 25 tablet 6   sevelamer  carbonate (RENVELA ) 800 MG tablet Take 800 mg by mouth 3 (three) times daily with meals.       tamsulosin  (FLOMAX ) 0.4 MG CAPS capsule Take 0.4 mg by mouth daily after breakfast.       traMADol  (ULTRAM ) 50 MG tablet Take 50 mg by mouth every 8 (eight) hours as needed.       sodium chloride  flush (NS) 0.9 % SOLN Inject 5 mLs into the vein daily for 15 days. flush drain daily with 5 cc NS, record output daily, dressing changes every 2-3 days or earlier if soiled 75 mL 0   sodium chloride  flush 0.9 % SOLN injection 10 mLs by Intracatheter route every 3 (three) days. 100 mL 2            Home: Home Living Family/patient expects to be discharged to:: Private residence Living Arrangements: Spouse/significant other Available Help at Discharge: Family, Available 24 hours/day Type of Home: House Home Access: Stairs to enter [employer] of Steps: 2 Entrance Stairs-Rails: Right, Left, Can reach both Home Layout:  One level Bathroom Shower/Tub: Psychologist, counselling, Engineer, manufacturing systems: Handicapped height Bathroom Accessibility: Yes Home Equipment: Agricultural consultant (2 wheels), Shower seat, BSC/3in1, Grab bars - toilet, Other (comment)   Functional History: Prior Function Prior Level of Function : Independent/Modified Independent Mobility Comments: Ambulates with AD. No falls in the past 19mo. ADLs Comments: Ind with ADLs, wife manages medications and assists with IADLs   Functional Status:  Mobility: Bed Mobility Overal bed mobility: Needs Assistance Bed Mobility: Sit to Supine Supine to sit: Contact guard, Supervision, HOB elevated, Used rails Sit to supine: Contact guard assist, Used rails General bed mobility comments: Pt sitting EOB on arrival and departure. Transfers Overall transfer level: Needs assistance Equipment used: Rolling walker (2 wheels) Transfers: Sit to/from Stand, Bed to chair/wheelchair/BSC Sit to Stand: Mod assist Bed to/from chair/wheelchair/BSC transfer type:: Squat pivot Squat pivot transfers: Mod assist Step pivot transfers: Min assist Anterior-Posterior transfers: Contact guard assist  Lateral/Scoot Transfers: Min assist General transfer comment: Mod A with face to face transfer with counting and heavy cueing for sequencing. Pt able to stand fully 1x after 2x attempts with multi modal cues for sequencing pt use of RW and good wgt shifting without the need for assistance toward the R this session. Improved COM over BOS. Once in  standing Min A for balance. CUes for safe sitting. Ambulation/Gait Ambulation/Gait assistance: Min assist, Mod assist Gait Distance (Feet): 14 Feet Assistive device: Rolling walker (2 wheels) Gait Pattern/deviations: Step-to pattern, Decreased step length - left, Decreased stance time - left, Decreased weight shift to left, Decreased step length - right, Decreased stride length, Trunk flexed, Leaning posteriorly General Gait Details:  unable at this time. Gait velocity: decreased Gait velocity interpretation: <1.31 ft/sec, indicative of household ambulator   ADL: ADL Overall ADL's : Needs assistance/impaired Eating/Feeding: Set up, Sitting Grooming: Wash/dry face, Oral care, Set up, Sitting Upper Body Bathing: Set up, Sitting Lower Body Bathing: Maximal assistance, Sitting/lateral leans Upper Body Dressing : Set up, Sitting Lower Body Dressing: Maximal assistance, Sitting/lateral leans Lower Body Dressing Details (indicate cue type and reason): pt reaching down to RLE but unable to complete Toilet Transfer: Minimal assistance, Rolling walker (2 wheels), Stand-pivot, BSC/3in1 Toilet Transfer Details (indicate cue type and reason): dense cues for LLE placement with steps and pt with tendency to point toe to floor despite use of darco shoe Toileting- Clothing Manipulation and Hygiene: Maximal assistance, Total assistance Toileting - Clothing Manipulation Details (indicate cue type and reason): Assist to steady in standing Functional mobility during ADLs: Moderate assistance, Rolling walker (2 wheels) General ADL Comments: Began balance training with ADLs in prep for LLE amputation   Cognition: Cognition Orientation Level: Oriented X4 Cognition Arousal: Alert Behavior During Therapy: WFL for tasks assessed/performed   Physical Exam: Blood pressure 106/61, pulse 79, temperature 98.7 F (37.1 C), temperature source Oral, resp. rate 18, height 6' (1.829 m), weight 70.3 kg, SpO2 100%. Physical Exam Constitutional:      General: He is not in acute distress. HENT:     Right Ear: External ear normal.     Left Ear: External ear normal.     Nose: Nose normal.     Mouth/Throat:     Mouth: Mucous membranes are moist.  Eyes:     Extraocular Movements: Extraocular movements intact.     Pupils: Pupils are equal, round, and reactive to light.  Cardiovascular:     Rate and Rhythm: Normal rate and regular rhythm.     Heart  sounds: No murmur heard.    No gallop.  Pulmonary:     Effort: Pulmonary effort is normal. No respiratory distress.     Breath sounds: No wheezing or rales.  Abdominal:     General: Bowel sounds are normal. There is no distension.     Palpations: Abdomen is soft.     Tenderness: There is no abdominal tenderness.     Comments: Percutaneous Chole drain in place  Musculoskeletal:     Cervical back: Normal range of motion.     Comments: Left BK limb is tender to palpation. Still with swelliing, Shrinker in place. Pt wearing limb guard.   Skin:    General: Skin is warm.     Comments: Left BKA site is dressed with shrinker. Minimal drainage from incision which is well approximated. LUE AVF intact with thrill  Neurological:     Mental Status: He is alert.     Comments: Voice is very raspy without stridor.  Patient is alert.  Makes eye contact with examiner.  Follows simple commands. Alert and oriented x 3. Normal insight and awareness. Intact Memory. Normal language and speech. Cranial nerve exam unremarkable. MMT: BUE 5/5. RLE 4/5 prox to 5/5 with ADF/PF. LLE limited by pain, has 3/5 HF at least. Sensory exam normal for light  touch and pain in all 4 limbs. No limb ataxia or cerebellar signs. No abnormal tone appreciated.  SABRA    Psychiatric:        Mood and Affect: Mood normal.        Behavior: Behavior normal.       Lab Results Last 48 Hours        Results for orders placed or performed during the hospital encounter of 01/14/24 (from the past 48 hours)  Glucose, capillary     Status: None    Collection Time: 02/03/24  6:05 AM  Result Value Ref Range    Glucose-Capillary 90 70 - 99 mg/dL      Comment: Glucose reference range applies only to samples taken after fasting for at least 8 hours.  Glucose, capillary     Status: Abnormal    Collection Time: 02/03/24 11:50 AM  Result Value Ref Range    Glucose-Capillary 162 (H) 70 - 99 mg/dL      Comment: Glucose reference range applies only to  samples taken after fasting for at least 8 hours.  Renal function panel     Status: Abnormal    Collection Time: 02/03/24  4:30 PM  Result Value Ref Range    Sodium 128 (L) 135 - 145 mmol/L      Comment: DELTA CHECK NOTED    Potassium 4.6 3.5 - 5.1 mmol/L    Chloride 89 (L) 98 - 111 mmol/L    CO2 22 22 - 32 mmol/L    Glucose, Bld 137 (H) 70 - 99 mg/dL      Comment: Glucose reference range applies only to samples taken after fasting for at least 8 hours.    BUN 62 (H) 8 - 23 mg/dL    Creatinine, Ser 0.91 (H) 0.61 - 1.24 mg/dL    Calcium  8.1 (L) 8.9 - 10.3 mg/dL    Phosphorus 4.4 2.5 - 4.6 mg/dL    Albumin  2.1 (L) 3.5 - 5.0 g/dL    GFR, Estimated 6 (L) >60 mL/min      Comment: (NOTE) Calculated using the CKD-EPI Creatinine Equation (2021)      Anion gap 17 (H) 5 - 15      Comment: Performed at [facility], [address] [place], [place] [address]  CBC     Status: Abnormal    Collection Time: 02/03/24  4:30 PM  Result Value Ref Range    WBC 8.9 4.0 - 10.5 K/uL    RBC 2.67 (L) 4.22 - 5.81 MIL/uL    Hemoglobin 7.9 (L) 13.0 - 17.0 g/dL    HCT 74.2 (L) 60.9 - 52.0 %    MCV 96.3 80.0 - 100.0 fL    MCH 29.6 26.0 - 34.0 pg    MCHC 30.7 30.0 - 36.0 g/dL    RDW 85.0 88.4 - 84.4 %    Platelets 144 (L) 150 - 400 K/uL    nRBC 0.0 0.0 - 0.2 %      Comment: Performed at [facility], [address], [place], [place] [address]  Glucose, capillary     Status: Abnormal    Collection Time: 02/03/24 10:58 PM  Result Value Ref Range    Glucose-Capillary 108 (H) 70 - 99 mg/dL      Comment: Glucose reference range applies only to samples taken after fasting for at least 8 hours.  Glucose, capillary     Status: Abnormal    Collection Time: 02/04/24  6:31 AM  Result Value Ref Range    Glucose-Capillary 100 (H) 70 - 99 mg/dL      Comment: Glucose reference range applies only to samples taken after fasting for at least 8 hours.    Comment 1 Notify RN      Comment 2 Document in Chart     Glucose, capillary     Status: Abnormal    Collection Time: 02/04/24  9:10 PM  Result Value Ref Range    Glucose-Capillary 121 (H) 70 - 99 mg/dL      Comment: Glucose reference range applies only to samples taken after fasting for at least 8 hours.       Imaging Results (Last 48 hours)  IR EXCHANGE BILIARY DRAIN Result Date: 02/03/2024 INDICATION: History of acute cholecystitis, post ultrasound fluoroscopic guided cholecystostomy tube placement on 12/31/2023. EXAM: FLUOROSCOPIC GUIDED CHOLECYSTOSTOMY TUBE EXCHANGE COMPARISON:  12/31/2023, 02/02/2024 MEDICATIONS: None ANESTHESIA/SEDATION: None CONTRAST:  20mL OMNIPAQUE  IOHEXOL  300 MG/ML SOLN - administered into the gallbladder lumen. FLUOROSCOPY TIME:  Three mGy reference air kerma COMPLICATIONS: None immediate. PROCEDURE: The patient was positioned supine on the fluoroscopy table. The external portion of the existing cholecystostomy tube as well as the surrounding skin was prepped and draped in usual sterile fashion. A time-out was performed prior to the initiation of the procedure. A preprocedural spot fluoroscopic image was obtained of the right upper abdominal quadrant existing cholecystostomy tube. The skin surrounding the cholecystostomy tube was anesthetized with 1% lidocaine  with epinephrine . The external portion of the cholecystostomy tube was cut and cannulated with a short Amplatz wire which was advanced through the tube and coiled within the gallbladder lumen Next, under intermittent fluoroscopic guidance, the existing 10 French cholecystostomy tube was exchanged for a new 10 [place] cholecystostomy tube which was repositioned into the more central aspect of the gallbladder lumen. Contrast injection confirms appropriate positioning and functionality of the cholecystomy tube. The cholecystostomy tube was flushed with a small amount of saline and reconnected to a suction bulb. The cholecystostomy tube was secured with an interrupted suture and a  Stat Lock device. A dressing was applied. The patient tolerated the procedure well without immediate postprocedural complication. FINDINGS: Preprocedural spot fluoroscopic image demonstrates unchanged positioning of cholecystostomy tube with end coiled and locked over the expected location of the fundus of the gallbladder There was purulent aspirate from the drain. After fluoroscopic guided exchange, the new 10 [place] cholecystostomy tube is more ideally positioned with end coiled and locked within the central aspect of the gallbladder lumen. Post exchange cholangiogram demonstrates appropriate positioning and functionality of the new cholecystostomy tube. IMPRESSION: Successful fluoroscopic guided exchange of 10 Fr percutaneous cholecystostomy tube. PLAN: Keep to bulb suction until purulent output clears, then switch to bag drainage. Ester Sides, MD [facility] Electronically Signed   By: Ester Sides M.D.   On: 02/03/2024 22:20           Blood pressure 106/61, pulse 79, temperature 98.7 F (37.1 C), temperature source Oral, resp. rate 18, height 6' (1.829 m), weight 70.3 kg, SpO2 100%.   Medical Problem List and Plan: 1. 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  Antithrombotics: -DVT/anticoagulation:  SQ heparin              -antiplatelet therapy: Aspirin  81 mg daily, Plavix  75 mg daily 3. Pain Management: Oxycodone  as needed             -will begin low dose gabapentin  for phantom limb pain 4. Mood/Behavior/Sleep: Provide emotional support             -antipsychotic agents: N/A 5. Neuropsych/cognition: This patient is capable of making decisions on his own behalf. 6. Skin/Wound Care: Routine skin checks 7.  Fluids/Electrolytes/Nutrition: Routine in and out with follow-up chemistries 8.  Aortic stenosis.  Status post TAVR 01/20/2024 9.  End-stage renal disease.  Continue hemodialysis as per renal services             -HD at end of day to allow participation in therapy 10.  Hyperlipidemia.  Lipitor 11.  Hypertension.  Coreg  3.125 mg twice daily, Imdur  120 mg daily.  Monitor with increased mobility 12.  Hypothyroidism.  Synthroid  13.  Acute on chronic anemia.  Continue Aranesp .  Follow-up CBC 14.  CAD with history of CABG.  No chest pain or shortness of breath.  Continue Imdur  15.  BPH.  Flomax  0.4 mg daily. 16.  Chronic diastolic congestive heart failure.  Monitor for any signs of fluid overload.  Follow-up per cardiology services.             -daily weights             -volume mgt with HD 17.  Recent acute cholecystitis June 2025.  Currently maintained on Augmentin  and linezolid  for 3 to 4 weeks and will confirm duration of antibiotic.  Radiology following  for percutaneous cholecystostomy tube for recent cholecystitis. Awaiting plan for continued suction drainage versus change to gravity bag.     Toribio PARAS Angiulli, PA-C 02/05/2024  I have personally performed a face to face diagnostic evaluation of this patient and formulated the key components of the plan.  Additionally, I have personally reviewed laboratory data, imaging studies, as well as relevant notes and concur with the physician assistant's documentation above.  The patient's status has not changed from the original H&P.  Any changes in documentation from the acute care chart have been noted above.  Arthea IVAR Gunther, MD, [facility]

## 2024-02-05 NOTE — Discharge Summary (Signed)
 Physician Discharge Summary   Patient: Phillip [name] MRN: [id number] DOB: 1956/04/06  Admit date:     01/14/2024  Discharge date: 02/05/24  Discharge Physician: Derryl Duval   PCP: [name] LABOR., MD   Recommendations at discharge:   Follow-up with IR regarding right cholecystostomy drain 2.   Follow-up with vascular surgery post left BKA  Discharge Diagnoses: Principal Problem:   Gangrene of toe of left foot (HCC) Active Problems:   CAD (coronary artery disease)   Essential hypertension   Atherosclerosis of native arteries of the extremities with ulceration (HCC)   Sleep apnea   Chronic combined systolic and diastolic CHF (congestive heart failure) (HCC)   Anemia of chronic disease   Coronary artery disease   Diabetes mellitus without complication (HCC)   Peripheral vascular disease (HCC)   ESRD on dialysis (HCC)   S/P TAVR (transcatheter aortic valve replacement)  Resolved Problems:   * No resolved hospital problems. *  Hospital course: Phillip [name] is an 68 y.o. male past medical history of end-stage renal disease on hemodialysis Tuesday Thursdays and Saturdays, diabetes mellitus type 2, recent acute cholecystitis in June 2025 requiring colostomy tube placement, chronic HFrEF with last EF of 30% in 2025, aortic stenosis status post TAVR 8/19/ 2025, PAD status post PCI to the left tibia-fibula on 12/26/2023 admitted by vascular surgery on 01/14/2024 for elective left third toe amputation due to gangrenous left third toe underwent angiogram of the left lower extremity on 12/26/2023 at [facility], however due to underlying comorbidities he was transferred to [facility]   Significant Events: 8/13 - left 3rd toe amputation 8/15 - left TMA and wound vac placment 8/19 - TAVR 8/22 - wound vac removed. 01/30/24 L BKA 9/2: Exchange cholecystostomy tube   Assessment/Plan:    Gangrene of toe of left foot (HCC) Status post transmetatarsal amputation with wound VAC  placement on 01/16/2024. Wound VAC removed on 01/23/2024. Ultimately BKA on 01/30/2024.  Needs follow-up with vascular surgery in 4 weeks for staples removal.    Chronic anemia Hemoglobin stable at 8.5.  Continue aspirin  and Plavix .   Severe aortic stenosis: status post TAVR on 01/20/2024.   Chronic HFpEF:  Appears euvolemic, continue volume status per hemodialysis.   End-stage renal disease: Nephrology on board continue to dialyze Tuesday Thursdays and Saturdays.   CAD with CABG: Last cath on February 2025. Continue aspirin  and Plavix .   Recent history of acute cholecystitis requiring cholecystectomy: Drain placed on 11/21/2023. Has remained afebrile with no leukocytosis, some erythema around the percutaneous biliary drain site with some purulent drainage soaking the gauze.   CT scan of the abdomen pelvis shows some soft tissue thickening and stranding with minimal fluid seen adjacent to the portion of the tubing.  No pericholecystic collection, some mild pericholecystic fat stranding. Continue cover MRSA and gram-negative's. Continue Augmentin  and linezolid  for 3  weeks after DC Cholecystostomy tube exchanged 02/03/2024.  Continue daily flushes, dressing changes    Controlled diabetes mellitus type 2: Continue sliding scale insulin , last A1c of 5.8.   Hypothyroidism: Continue Synthroid .   Disposition: CIR         Consultants: Vascular, IR Procedures performed: Left Foot amputation, left BKA, cholecystostomy tube exchange  Disposition: CIR Diet recommendation:  Discharge Diet Orders (From admission, onward)     Start     Ordered   02/05/24 0000  Diet - low sodium heart healthy        02/05/24 1258  DISCHARGE MEDICATION: Allergies as of 02/05/2024   No Known Allergies      Medication List     TAKE these medications    acetaminophen  500 MG tablet Commonly known as: TYLENOL  Take 1,000 mg by mouth every 6 (six) hours as needed for moderate pain or  headache.   amoxicillin -clavulanate 500-125 MG tablet Commonly known as: AUGMENTIN  Take 1 tablet by mouth 2 (two) times daily.   aspirin  EC 81 MG tablet Take 1 tablet (81 mg total) by mouth daily. Swallow whole.   atorvastatin  20 MG tablet Commonly known as: LIPITOR Take 1 tablet (20 mg total) by mouth daily.   bisacodyl  5 MG EC tablet Commonly known as: DULCOLAX Take 1 tablet (5 mg total) by mouth daily as needed for moderate constipation.   carvedilol  3.125 MG tablet Commonly known as: COREG  Take 1 tablet (3.125 mg total) by mouth 2 (two) times daily.   clopidogrel  75 MG tablet Commonly known as: Plavix  Take 1 tablet (75 mg total) by mouth daily.   gabapentin  300 MG capsule Commonly known as: NEURONTIN  Take 1 capsule (300 mg total) by mouth at bedtime. What changed:  how much to take when to take this   insulin  glargine 100 UNIT/ML injection Commonly known as: LANTUS  Inject 0.07 mLs (7 Units total) into the skin at bedtime. Inject 7 units at bedtime as needed after checking glucose What changed:  when to take this reasons to take this   isosorbide  mononitrate 120 MG 24 hr tablet Commonly known as: IMDUR  Take 1 tablet (120 mg total) by mouth daily.   latanoprost  0.005 % ophthalmic solution Commonly known as: XALATAN  Place 1 drop into both eyes at bedtime.   levothyroxine  50 MCG tablet Commonly known as: SYNTHROID  Take 50 mcg by mouth daily.   linezolid  600 MG tablet Commonly known as: ZYVOX  Take 1 tablet (600 mg total) by mouth every 12 (twelve) hours.   MIRCERA IJ 30 mcg.   nitroGLYCERIN  0.4 MG SL tablet Commonly known as: NITROSTAT  Place 1 tablet (0.4 mg total) under the tongue every 5 (five) minutes as needed for chest pain.   Normal Saline Flush 0.9 % Soln Inject 5 mLs into the vein daily for 15 days. flush drain daily with 5 cc NS, record output daily, dressing changes every 2-3 days or earlier if soiled   sodium chloride  flush 0.9 % Soln  injection 10 mLs by Intracatheter route every 3 (three) days.   sevelamer  carbonate 800 MG tablet Commonly known as: RENVELA  Take 800 mg by mouth 3 (three) times daily with meals.   tamsulosin  0.4 MG Caps capsule Commonly known as: FLOMAX  Take 0.4 mg by mouth daily after breakfast.   traMADol  50 MG tablet Commonly known as: ULTRAM  Take 50 mg by mouth every 8 (eight) hours as needed.        Follow-up Information     Sebastian Lamarr SAUNDERS, PA-C. Go on 01/28/2024.   Specialties: Cardiology, Radiology Why: @ 2:45pm, please arrive at least 20 minutes early. Contact information: [address] [place] [address] [phone]         [facility] at [facility] Follow up in 5 week(s).   Specialty: Vascular Surgery Why: Office will call you to arrange your appt (sent). Contact information: [address] [place]  [address] [phone]               Discharge Exam: Phillip [name]  02/04/24 9367 02/04/24 1631 02/05/24 0443  Weight: 76.6 kg 70 kg 70.3 kg   General: Alert, oriented not in any acute distress HEENT: Moist oral mucosa Chest: Clear to auscultation bilaterally CVS: S1, S2, no murmur, regular rhythm Abdomen: Soft, nontender, right cholecystostomy tube with minimal drainage Extremities: Left BKA with staples intact, incision clean and dry  Condition at discharge: fair  The results of significant diagnostics from this hospitalization (including imaging, microbiology, ancillary and laboratory) are listed below for reference.   Imaging Studies: IR EXCHANGE BILIARY DRAIN Result Date: 02/03/2024 INDICATION: History of acute cholecystitis, post ultrasound fluoroscopic guided cholecystostomy tube placement on 12/31/2023. EXAM: FLUOROSCOPIC GUIDED CHOLECYSTOSTOMY TUBE EXCHANGE COMPARISON:  12/31/2023, 02/02/2024 MEDICATIONS: None ANESTHESIA/SEDATION: None CONTRAST:  20mL OMNIPAQUE  IOHEXOL   300 MG/ML SOLN - administered into the gallbladder lumen. FLUOROSCOPY TIME:  Three mGy reference air kerma COMPLICATIONS: None immediate. PROCEDURE: The patient was positioned supine on the fluoroscopy table. The external portion of the existing cholecystostomy tube as well as the surrounding skin was prepped and draped in usual sterile fashion. A time-out was performed prior to the initiation of the procedure. A preprocedural spot fluoroscopic image was obtained of the right upper abdominal quadrant existing cholecystostomy tube. The skin surrounding the cholecystostomy tube was anesthetized with 1% lidocaine  with epinephrine . The external portion of the cholecystostomy tube was cut and cannulated with a short Amplatz wire which was advanced through the tube and coiled within the gallbladder lumen Next, under intermittent fluoroscopic guidance, the existing 10 French cholecystostomy tube was exchanged for a new 10 [place] cholecystostomy tube which was repositioned into the more central aspect of the gallbladder lumen. Contrast injection confirms appropriate positioning and functionality of the cholecystomy tube. The cholecystostomy tube was flushed with a small amount of saline and reconnected to a suction bulb. The cholecystostomy tube was secured with an interrupted suture and a Stat Lock device. A dressing was applied. The patient tolerated the procedure well without immediate postprocedural complication. FINDINGS: Preprocedural spot fluoroscopic image demonstrates unchanged positioning of cholecystostomy tube with end coiled and locked over the expected location of the fundus of the gallbladder There was purulent aspirate from the drain. After fluoroscopic guided exchange, the new 10 [place] cholecystostomy tube is more ideally positioned with end coiled and locked within the central aspect of the gallbladder lumen. Post exchange cholangiogram demonstrates appropriate positioning and functionality of the new  cholecystostomy tube. IMPRESSION: Successful fluoroscopic guided exchange of 10 Fr percutaneous cholecystostomy tube. PLAN: Keep to bulb suction until purulent output clears, then switch to bag drainage. Ester Sides, MD [facility] Radiology Electronically Signed   By: Ester Sides M.D.   On: 02/03/2024 22:20   CT ABDOMEN PELVIS W CONTRAST Result Date: 02/02/2024 CLINICAL DATA:  Provided history: purulence from cholecystostomy tube, rule out abscess EXAM: CT ABDOMEN AND PELVIS WITH CONTRAST TECHNIQUE: Multidetector CT imaging of the abdomen and pelvis was performed using the standard protocol following bolus administration of intravenous contrast. RADIATION DOSE REDUCTION: This exam was performed according to the departmental dose-optimization program which includes automated exposure control, adjustment of the mA and/or kV according to patient size and/or use of iterative reconstruction technique. CONTRAST:  75mL OMNIPAQUE  IOHEXOL  350 MG/ML SOLN COMPARISON:  Most recent abdominal imaging MRI 11/11/2023, abdominopelvic CT 11/06/2020 FINDINGS: Lower chest: 4 mm subpleural left lower lobe nodule, series 4, image 11, unchanged from 2022 and considered definitively benign. No pleural fluid or basilar opacity. Hepatobiliary: Cholecystostomy tube is coiled in the gallbladder. The gallbladder  is filled with innumerable gallstones. Soft tissue thickening, stranding, and minimal fluid is seen adjacent to the portion of the tubing outside the gallbladder extending to the subcutaneous tissues. No definite pericholecystic collection. Suggestion of mild pericholecystic fat stranding. Gallbladder wall is not well assessed on the current exam. No common bile duct dilatation. No intrahepatic biliary ductal dilatation. Diffuse hepatic steatosis. No evidence of intrahepatic collection. Pancreas: No ductal dilatation or inflammation. Spleen: Mildly enlarged, 13.5 cm AP. Tiny  hypodensity in the inferior spleen is too small to characterize but typically of no clinical significance. Adrenals/Urinary Tract: No adrenal nodule. No hydronephrosis or evidence of renal inflammation. Absent renal excretion on delayed phase imaging. No renal calculi. Partially distended urinary bladder. No bladder wall thickening. Stomach/Bowel: Physiologic appearance of the stomach. There is no small bowel obstruction or inflammation. Moderate formed stool in the colon. Scattered colonic diverticula. No diverticulitis. Appendectomy. Vascular/Lymphatic: Advanced aortic atherosclerosis. No aortic aneurysm. Patent portal and splenic veins. No suspicious lymphadenopathy. Reproductive: Prostate is unremarkable. Other: No free air. No significant ascites. Soft tissue density in gas within the anterior abdominal wall likely related to medication injections. Musculoskeletal: No acute osseous findings. Right hip osteoarthritis. Lumbar degenerative change. Chronic bilateral L5 pars defects. IMPRESSION: 1. Cholecystostomy tube is coiled in the gallbladder. The gallbladder is filled with innumerable gallstones. Soft tissue thickening, stranding, and minimal fluid is seen adjacent to the portion of the tubing outside the gallbladder extending to the subcutaneous tissues. No definite pericholecystic collection. Suggestion of mild pericholecystic fat stranding. 2. Hepatic steatosis. 3. Mild splenomegaly. 4. Colonic diverticulosis without diverticulitis. 5. Absent renal excretion on delayed phase imaging, consistent with end-stage renal disease. Aortic Atherosclerosis (ICD10-I70.0). Electronically Signed   By: Andrea Gasman M.D.   On: 02/02/2024 19:51   ECHOCARDIOGRAM COMPLETE Result Date: 01/21/2024    ECHOCARDIOGRAM REPORT   Patient Name:   Phillip [name] Date of Exam: 01/21/2024 Medical Rec #:  [id number]        Height:       72.0 in Accession #:    [id number]       Weight:       165.6 lb Date of Birth:  [birth year]        BSA:          1.966 m Patient Age:    67 years         BP:           128/75 mmHg Patient Gender: M                HR:           77 bpm. Exam Location:  Inpatient Procedure: 2D Echo, Cardiac Doppler and Color Doppler (Both Spectral and Color            Flow Doppler were utilized during procedure). Indications:    Post TAVR evaluation. ; I35.0 Nonrheumatic aortic (valve)                 stenosis  History:        Patient has prior history of Echocardiogram examinations, most                 recent 01/20/2024. CHF, CAD, Prior CABG, Pulmonary HTN, Aortic                 Valve Disease, Signs/Symptoms:Edema; Risk Factors:Hypertension                 and Dyslipidemia. ESRD.  Aortic Valve: 29 mm Edwards Sapien prosthetic, stented (TAVR)                 valve is present in the aortic position. Procedure Date:                 01/20/2024.  Sonographer:    Ellouise Mose RDCS Referring Phys: [id number] [facility] R THOMPSON  Sonographer Comments: Technically difficult study due to poor echo windows. IMPRESSIONS  1. Left ventricular ejection fraction, by estimation, is 30 to 35%. The left ventricle has moderately decreased function. The left ventricle demonstrates regional wall motion abnormalities (see scoring diagram/findings for description). The left ventricular internal cavity size was moderately dilated. There is mild concentric left ventricular hypertrophy. Left ventricular diastolic parameters are consistent with Grade I diastolic dysfunction (impaired relaxation).  2. The mitral valve is degenerative. Mild to moderate mitral valve regurgitation. No evidence of mitral stenosis. Moderate mitral annular calcification.  3. The aortic valve has been repaired/replaced. There is a 29 mm Edwards Sapien prosthetic (TAVR) valve present in the aortic position. Procedure Date: 01/20/2024. Echo findings are consistent with trivial perivalvular leak of the aortic prosthesis.  4. Right ventricular systolic function is moderately  reduced. The right ventricular size is normal.  5. Left atrial size was mildly dilated. FINDINGS  Left Ventricle: Left ventricular ejection fraction, by estimation, is 30 to 35%. The left ventricle has moderately decreased function. The left ventricle demonstrates regional wall motion abnormalities. The left ventricular internal cavity size was moderately dilated. There is mild concentric left ventricular hypertrophy. Left ventricular diastolic parameters are consistent with Grade I diastolic dysfunction (impaired relaxation).  LV Wall Scoring: The inferior wall and posterior wall are akinetic. Right Ventricle: The right ventricular size is normal. No increase in right ventricular wall thickness. Right ventricular systolic function is moderately reduced. Left Atrium: Left atrial size was mildly dilated. Right Atrium: Right atrial size was normal in size. Pericardium: There is no evidence of pericardial effusion. Mitral Valve: The mitral valve is degenerative in appearance. Moderate mitral annular calcification. Mild to moderate mitral valve regurgitation. No evidence of mitral valve stenosis. MV peak gradient, 7.8 mmHg. The mean mitral valve gradient is 3.0 mmHg. Tricuspid Valve: The tricuspid valve is normal in structure. Tricuspid valve regurgitation is trivial. No evidence of tricuspid stenosis. Aortic Valve: Trivial perivalvular leak and 2 o'clock. The aortic valve has been repaired/replaced. Aortic valve regurgitation is not visualized. No aortic stenosis is present. Aortic valve mean gradient measures 5.0 mmHg. Aortic valve peak gradient measures 10.2 mmHg. Aortic valve area, by VTI measures 4.55 cm. There is a 29 mm Edwards Sapien prosthetic, stented (TAVR) valve present in the aortic position. Procedure Date: 01/20/2024. Echo findings are consistent with normal structure and function of the aortic valve prosthesis. Pulmonic Valve: The pulmonic valve was normal in structure. Pulmonic valve regurgitation is  not visualized. No evidence of pulmonic stenosis. Aorta: The aortic root and ascending aorta are structurally normal, with no evidence of dilitation. IAS/Shunts: There is redundancy of the interatrial septum. No atrial level shunt detected by color flow Doppler.  LEFT VENTRICLE PLAX 2D LVIDd:         5.18 cm      Diastology LVIDs:         4.80 cm      LV e' medial:    3.92 cm/s LV PW:         1.28 cm      LV E/e' medial:  22.8 LV IVS:  1.39 cm      LV e' lateral:   5.55 cm/s LVOT diam:     2.90 cm      LV E/e' lateral: 16.1 LV SV:         124 LV SV Index:   63 LVOT Area:     6.61 cm  LV Volumes (MOD) LV vol d, MOD A2C: 277.0 ml LV vol d, MOD A4C: 198.0 ml LV vol s, MOD A2C: 206.0 ml LV vol s, MOD A4C: 138.0 ml LV SV MOD A2C:     71.0 ml LV SV MOD A4C:     198.0 ml LV SV MOD BP:      71.8 ml RIGHT VENTRICLE            IVC RV S prime:     7.40 cm/s  IVC diam: 1.70 cm TAPSE (M-mode): 0.7 cm LEFT ATRIUM             Index        RIGHT ATRIUM           Index LA diam:        3.96 cm 2.01 cm/m   RA Area:     16.20 cm LA Vol (A2C):   38.2 ml 19.43 ml/m  RA Volume:   46.30 ml  23.55 ml/m LA Vol (A4C):   28.2 ml 14.34 ml/m LA Biplane Vol: 32.4 ml 16.48 ml/m  AORTIC VALVE AV Area (Vmax):    5.08 cm AV Area (Vmean):   4.73 cm AV Area (VTI):     4.55 cm AV Vmax:           160.00 cm/s AV Vmean:          100.467 cm/s AV VTI:            0.273 m AV Peak Grad:      10.2 mmHg AV Mean Grad:      5.0 mmHg LVOT Vmax:         123.00 cm/s LVOT Vmean:        71.900 cm/s LVOT VTI:          0.188 m LVOT/AV VTI ratio: 0.69  AORTA Ao Root diam: 3.51 cm Ao Asc diam:  3.59 cm MITRAL VALVE MV Area (PHT): 4.15 cm     SHUNTS MV Area VTI:   3.42 cm     Systemic VTI:  0.19 m MV Peak grad:  7.8 mmHg     Systemic Diam: 2.90 cm MV Mean grad:  3.0 mmHg MV Vmax:       1.40 m/s MV Vmean:      88.5 cm/s MV Decel Time: 183 msec MV E velocity: 89.40 cm/s MV A velocity: 127.00 cm/s MV E/A ratio:  0.70 Toribio Fuel MD Electronically signed  by Toribio Fuel MD Signature Date/Time: 01/21/2024/12:14:45 PM    Final    ECHOCARDIOGRAM LIMITED Result Date: 01/20/2024    ECHOCARDIOGRAM LIMITED REPORT   Patient Name:   Phillip [name] Date of Exam: 01/20/2024 Medical Rec #:  [id number]        Height:       72.0 in Accession #:    [id number]       Weight:       165.8 lb Date of Birth:  07-Jan-1956       BSA:          1.967 m Patient Age:    67 years         BP:  105/60 mmHg Patient Gender: M                HR:           61 bpm. Exam Location:  Inpatient Procedure: Limited Color Doppler (Both Spectral and Color Flow Doppler were            utilized during procedure). Indications:     aortic stenosis. TAVR  History:         Patient has prior history of Echocardiogram examinations, most                  recent 08/01/2023. CAD, Prior CABG, end stage renal disease;                  Risk Factors:Hypertension, Dyslipidemia, Diabetes and Sleep                  Apnea.                  Aortic Valve: 29 mm valve is present in the aortic position.                  Procedure Date: 01/20/2024.  Sonographer:     Tinnie Barefoot RDCS Referring Phys:  [id number] LAMARR SAUNDERS THOMPSON Diagnosing Phys: Darryle Decent MD IMPRESSIONS  1. Echo guided TAVR. Vmax 1.21 m/s, MG 3.0 mmHG, EOA 4.57 cm2. No regurgitation or paravalvular leak. Aortic valve regurgitation is not visualized. There is a 29 mm valve present in the aortic position. Procedure Date: 01/20/2024. Aortic valve area, by VTI measures 4.57 cm. Aortic valve mean gradient measures 3.0 mmHg. Aortic valve Vmax measures 1.21 m/s.  2. Left ventricular ejection fraction, by estimation, is 30 to 35%. The left ventricle has moderately decreased function.  3. Right ventricular systolic function is normal. The right ventricular size is normal.  4. The mitral valve is degenerative. Mild mitral valve regurgitation. No evidence of mitral stenosis. FINDINGS  Left Ventricle: Left ventricular ejection fraction, by estimation, is 30  to 35%. The left ventricle has moderately decreased function.  LV Wall Scoring: The inferior wall and posterior wall are akinetic. Right Ventricle: The right ventricular size is normal. No increase in right ventricular wall thickness. Right ventricular systolic function is normal. Pericardium: There is no evidence of pericardial effusion. Mitral Valve: The mitral valve is degenerative in appearance. Mild mitral valve regurgitation. No evidence of mitral valve stenosis. Tricuspid Valve: The tricuspid valve is grossly normal. Tricuspid valve regurgitation is mild . No evidence of tricuspid stenosis. Aortic Valve: Echo guided TAVR. Vmax 1.21 m/s, MG 3.0 mmHG, EOA 4.57 cm2. No regurgitation or paravalvular leak. Aortic valve regurgitation is not visualized. Aortic regurgitation PHT measures 446 msec. Aortic valve mean gradient measures 3.0 mmHg. Aortic valve peak gradient measures 5.9 mmHg. Aortic valve area, by VTI measures 4.57 cm. There is a 29 mm valve present in the aortic position. Procedure Date: 01/20/2024. Aorta: The aortic root and ascending aorta are structurally normal, with no evidence of dilitation. Additional Comments: Spectral Doppler performed. Color Doppler performed.  LEFT VENTRICLE PLAX 2D LVIDd:         5.30 cm LV PW:         1.00 cm LV IVS:        1.20 cm LVOT diam:     2.68 cm LV SV:         105 LV SV Index:   54 LVOT Area:     5.64 cm  LV Volumes (  MOD) LV vol d, MOD A4C: 181.0 ml LV vol s, MOD A4C: 117.0 ml LV SV MOD A4C:     181.0 ml LEFT ATRIUM         Index LA diam:    3.70 cm 1.88 cm/m  AORTIC VALVE AV Area (Vmax):    4.90 cm AV Area (Vmean):   4.80 cm AV Area (VTI):     4.57 cm AV Vmax:           121.00 cm/s AV Vmean:          80.400 cm/s AV VTI:            0.231 m AV Peak Grad:      5.9 mmHg AV Mean Grad:      3.0 mmHg LVOT Vmax:         105.00 cm/s LVOT Vmean:        68.400 cm/s LVOT VTI:          0.187 m LVOT/AV VTI ratio: 0.81 AI PHT:            446 msec  AORTA Ao Root diam: 3.40 cm  Ao Asc diam:  3.30 cm  SHUNTS Systemic VTI:  0.19 m Systemic Diam: 2.68 cm Darryle Decent MD Electronically signed by Darryle Decent MD Signature Date/Time: 01/20/2024/6:03:09 PM    Final    Structural Heart Procedure Result Date: 01/20/2024 See surgical note for result.   Microbiology: Results for orders placed or performed during the hospital encounter of 01/14/24  Surgical pcr screen     Status: None   Collection Time: 01/16/24  2:52 AM   Specimen: Nasal Mucosa; Nasal Swab  Result Value Ref Range Status   MRSA, PCR NEGATIVE NEGATIVE Final   Staphylococcus aureus NEGATIVE NEGATIVE Final    Comment: (NOTE) The Xpert SA Assay (FDA approved for NASAL specimens in patients 47 years of age and older), is one component of a comprehensive surveillance program. It is not intended to diagnose infection nor to guide or monitor treatment. Performed at [facility], [address] [place], [place] [address]     Labs: CBC: Recent Labs  Lab 01/30/24 0708 01/30/24 0844 01/30/24 0917 01/31/24 0341 02/01/24 0342 02/03/24 1630 02/05/24 0650  WBC 9.7  --   --  10.3 10.5 8.9 9.0  NEUTROABS  --   --   --   --   --   --  6.9  HGB 9.8*   < > 10.9* 8.5* 8.8* 7.9* 9.7*  HCT 32.1*   < > 32.0* 27.6* 27.9* 25.7* 31.3*  MCV 97.9  --   --  96.2 95.9 96.3 96.3  PLT 173  --   --  139* 151 144* 156   < > = values in this interval not displayed.   Basic Metabolic Panel: Recent Labs  Lab 01/31/24 0341 02/01/24 0342 02/02/24 0429 02/03/24 1630 02/05/24 0650  NA 132* 132* 136 128* 134*  K 4.7 3.9 4.3 4.6 4.4  CL 92* 92* 92* 89* 91*  CO2 22 28 28 22 28   GLUCOSE 169* 119* 122* 137* 118*  BUN 45* 26* 41* 62* 18  CREATININE 7.03* 4.80* 6.87* 9.08* 4.24*  CALCIUM  8.3* 8.2* 8.6* 8.1* 8.8*  PHOS  --   --   --  4.4  --    Liver Function Tests: Recent Labs  Lab 02/03/24 1630  ALBUMIN  2.1*   CBG: Recent Labs  Lab 02/03/24 2258 02/04/24 0631 02/04/24 2110 02/05/24 0559 02/05/24  1126   GLUCAP 108* 100* 121* 108* 134*    Discharge time spent: greater than 30 minutes.  Signed: Derryl Duval, MD [facility] 02/05/2024

## 2024-02-05 NOTE — Progress Notes (Signed)
 Called Autumn, RN and gave report on patient. He will be going to [address]. He is alert and oriented and ready to go. He has all of his belongings and will be taken by bed to the unit. His wife has been called and informed of the unit and new room where he will be going.

## 2024-02-05 NOTE — Discharge Summary (Signed)
 Physician Discharge Summary  Patient ID: Phillip [name] MRN: [id number] DOB/AGE: [id number] 69 y.o.  Admit date: 02/05/2024 Discharge date: 02/23/2024  Discharge Diagnoses:  Principal Problem:   Left below-knee amputee [facility]) Active Problems:   Protein-calorie malnutrition, severe DVT prophylaxis Aortic stenosis/TAVR End-stage renal disease Hyperlipidemia Hypertension Hypothyroidism Acute on chronic anemia CAD/CABG BPH Chronic diastolic congestive heart failure Recent acute cholecystitis  Discharged Condition: Stable  Significant Diagnostic Studies: ECHOCARDIOGRAM COMPLETE Result Date: 02/17/2024    ECHOCARDIOGRAM REPORT   Patient Name:   Phillip [name] Date of Exam: 02/17/2024 Medical Rec #:  [id number]        Height:       72.0 in Accession #:    [id number]       Weight:       162.5 lb Date of Birth:  [birth year]       BSA:          1.950 m Patient Age:    67 years         BP:           130/50 mmHg Patient Gender: M                HR:           66 bpm. Exam Location:  Inpatient Procedure: 2D Echo, Cardiac Doppler and Color Doppler (Both Spectral and Color            Flow Doppler were utilized during procedure). Indications:    Post TAVR evaluation Z95.2  History:        Patient has prior history of Echocardiogram examinations, most                 recent 01/21/2024. CAD, Prior CABG, Arrythmias:Bradycardia; Risk                 Factors:Diabetes and Hypertension. H/O Hyperlipidemia, Edema,                 Weakness, Palpitations, Aortic Stenosis S/P TAVR, Pulmonary                 hypertension, End stage renal disease.                 Aortic Valve: 29 mm Sapien prosthetic, stented (TAVR) valve is                 present in the aortic position.  Sonographer:    BERNARDA ROCKS Referring Phys: KATHRYN R THOMPSON IMPRESSIONS  1. Left ventricular ejection fraction, by estimation, is 25 to 30%. The left ventricle has severely decreased function. The left ventricle demonstrates global hypokinesis.  The left ventricular internal cavity size was mildly dilated. Left ventricular diastolic parameters are indeterminate.  2. Right ventricular systolic function is mildly reduced. The right ventricular size is normal. There is mildly elevated pulmonary artery systolic pressure.  3. Left atrial size was mildly dilated.  4. The mitral valve is normal in structure. Mild to moderate mitral valve regurgitation.  5. The aortic valve has been repaired/replaced. Aortic valve regurgitation is not visualized. There is a 29 mm Sapien prosthetic (TAVR) valve present in the aortic position. Aortic valve area, by VTI measures 2.62 cm. Aortic valve mean gradient measures 8.0 mmHg. Aortic valve Vmax measures 2.00 m/s.  6. The inferior vena cava is normal in size with greater than 50% respiratory variability, suggesting right atrial pressure of 3 mmHg. FINDINGS  Left Ventricle: Left ventricular ejection fraction, by estimation, is 25 to 30%.  The left ventricle has severely decreased function. The left ventricle demonstrates global hypokinesis. The left ventricular internal cavity size was mildly dilated. There is no left ventricular hypertrophy. Left ventricular diastolic parameters are indeterminate. Right Ventricle: The right ventricular size is normal. No increase in right ventricular wall thickness. Right ventricular systolic function is mildly reduced. There is mildly elevated pulmonary artery systolic pressure. The tricuspid regurgitant velocity  is 2.95 m/s, and with an assumed right atrial pressure of 3 mmHg, the estimated right ventricular systolic pressure is 37.8 mmHg. Left Atrium: Left atrial size was mildly dilated. Right Atrium: Right atrial size was normal in size. Pericardium: There is no evidence of pericardial effusion. Mitral Valve: The mitral valve is normal in structure. Mild mitral annular calcification. Mild to moderate mitral valve regurgitation. MV peak gradient, 10.0 mmHg. The mean mitral valve gradient is  4.0 mmHg. Tricuspid Valve: The tricuspid valve is normal in structure. Tricuspid valve regurgitation is trivial. Aortic Valve: The aortic valve has been repaired/replaced. Aortic valve regurgitation is not visualized. Aortic valve mean gradient measures 8.0 mmHg. Aortic valve peak gradient measures 16.0 mmHg. Aortic valve area, by VTI measures 2.62 cm. There is a 29 mm Sapien prosthetic, stented (TAVR) valve present in the aortic position. Pulmonic Valve: The pulmonic valve was normal in structure. Pulmonic valve regurgitation is not visualized. Aorta: The aortic root is normal in size and structure. Venous: The inferior vena cava is normal in size with greater than 50% respiratory variability, suggesting right atrial pressure of 3 mmHg. IAS/Shunts: The interatrial septum was not well visualized.  LEFT VENTRICLE PLAX 2D LVIDd:         6.20 cm      Diastology LVIDs:         5.80 cm      LV e' medial:    14.90 cm/s LV PW:         0.80 cm      LV E/e' medial:  9.6 LV IVS:        1.00 cm      LV e' lateral:   10.70 cm/s LVOT diam:     2.10 cm      LV E/e' lateral: 13.4 LV SV:         96 LV SV Index:   49 LVOT Area:     3.46 cm  LV Volumes (MOD) LV vol d, MOD A2C: 402.0 ml LV vol d, MOD A4C: 302.0 ml LV vol s, MOD A2C: 222.0 ml LV vol s, MOD A4C: 184.0 ml LV SV MOD A2C:     180.0 ml LV SV MOD A4C:     302.0 ml LV SV MOD BP:      154.8 ml RIGHT VENTRICLE            IVC RV Basal diam:  4.70 cm    IVC diam: 2.00 cm RV S prime:     7.72 cm/s RVOT diam:      3.40 cm TAPSE (M-mode): 1.7 cm RVSP:           37.8 mmHg LEFT ATRIUM             Index        RIGHT ATRIUM           Index LA diam:        4.00 cm 2.05 cm/m   RA Pressure: 3.00 mmHg LA Vol (A2C):   63.6 ml 32.61 ml/m  RA Area:     16.20 cm LA  Vol (A4C):   54.8 ml 28.10 ml/m  RA Volume:   37.20 ml  19.07 ml/m LA Biplane Vol: 59.1 ml 30.30 ml/m  AORTIC VALVE                     PULMONIC VALVE AV Area (Vmax):    2.55 cm      PV Vmax:       1.36 m/s AV Area (Vmean):    2.19 cm      PV Peak grad:  7.5 mmHg AV Area (VTI):     2.62 cm AV Vmax:           200.00 cm/s AV Vmean:          132.000 cm/s AV VTI:            0.368 m AV Peak Grad:      16.0 mmHg AV Mean Grad:      8.0 mmHg LVOT Vmax:         147.00 cm/s LVOT Vmean:        83.500 cm/s LVOT VTI:          0.278 m LVOT/AV VTI ratio: 0.76  AORTA Ao Root diam: 3.20 cm Ao Asc diam:  3.80 cm MITRAL VALVE                TRICUSPID VALVE MV Area (PHT): 3.93 cm     TR Peak grad:   34.8 mmHg MV Area VTI:   2.29 cm     TR Vmax:        295.00 cm/s MV Peak grad:  10.0 mmHg    Estimated RAP:  3.00 mmHg MV Mean grad:  4.0 mmHg     RVSP:           37.8 mmHg MV Vmax:       1.58 m/s MV Vmean:      87.3 cm/s    SHUNTS MV Decel Time: 193 msec     Systemic VTI:  0.28 m MR Peak grad: 83.9 mmHg     Systemic Diam: 2.10 cm MR Mean grad: 53.0 mmHg     Pulmonic Diam: 3.40 cm MR Vmax:      458.00 cm/s MR Vmean:     343.0 cm/s MV E velocity: 143.00 cm/s MV A velocity: 112.00 cm/s MV E/A ratio:  1.28 Aditya Sabharwal Electronically signed by Ria Commander Signature Date/Time: 02/17/2024/5:20:06 PM    Final    IR EXCHANGE BILIARY DRAIN Result Date: 02/17/2024 INDICATION: Occluded cholecystostomy EXAM: FLUOROSCOPIC EXCHANGE AND UPSIZE OF THE EXISTING CHOLECYSTOSTOMY MEDICATIONS: 1% LIDOCAINE  LOCAL ANESTHESIA/SEDATION: Moderate (conscious) sedation was employed during this procedure. A total of Versed  1.0 mg and Fentanyl  25 mcg was administered intravenously by the radiology nurse. Total intra-service moderate Sedation Time: 10 minutes. The patient's level of consciousness and vital signs were monitored continuously by radiology nursing throughout the procedure under my direct supervision. FLUOROSCOPY: Radiation Exposure Index (as provided by the fluoroscopic device): 7.0 mGy Kerma COMPLICATIONS: None immediate. PROCEDURE: Informed written consent was obtained from the patient after a thorough discussion of the procedural risks, benefits and  alternatives. All questions were addressed. Maximal Sterile Barrier Technique was utilized including caps, mask, sterile gowns, sterile gloves, sterile drape, hand hygiene and skin antiseptic. A timeout was performed prior to the initiation of the procedure. Under sterile conditions and local anesthesia attempts were made to remove the occluded catheter. This was unsuccessful. Catheter was cut and removed. Percutaneous tract was recannulated with a  Kumpe catheter. Catheter advanced into the gallbladder. Contrast injection confirms gallbladder packed with stones. Cystic duct occluded. Amplatz guidewire inserted. Tract dilatation performed to insert a 12 French cholecystostomy. Retention loop formed in the gallbladder. Contrast injection confirms position. Images obtained for documentation. Catheter secured with a silk suture and a sterile dressing. Gravity drainage bag connected. IMPRESSION: Successful fluoroscopic exchange and upsize of the percutaneous cholecystostomy Electronically Signed   By: CHRISTELLA.  Shick M.D.   On: 02/17/2024 12:10   CT ABDOMEN PELVIS WO CONTRAST Result Date: 02/15/2024 CLINICAL DATA:  Cholecystostomy tube not flushing. EXAM: CT ABDOMEN AND PELVIS WITHOUT CONTRAST TECHNIQUE: Multidetector CT imaging of the abdomen and pelvis was performed following the standard protocol without IV contrast. RADIATION DOSE REDUCTION: This exam was performed according to the departmental dose-optimization program which includes automated exposure control, adjustment of the mA and/or kV according to patient size and/or use of iterative reconstruction technique. COMPARISON:  CT abdomen pelvis dated 02/05/2024. FINDINGS: Evaluation of this exam is limited in the absence of intravenous contrast. Lower chest: The visualized lung bases are clear. Aortic valve repair. Coronary vascular calcification. No intra-abdominal free air or free fluid. Hepatobiliary: The liver is unremarkable. No BD dilatation. High attenuating  content within the gallbladder likely combination of sludge, stones, and injected contrast. There is diffuse thickened and hazy appearance of the gallbladder wall likely related to chronic inflammation. Percutaneous cholecystostomy with pigtail tip in the gallbladder fundus. No extraluminal fluid collection noted. Pancreas: Unremarkable. No pancreatic ductal dilatation or surrounding inflammatory changes. Spleen: Normal in size without focal abnormality. Adrenals/Urinary Tract: The adrenal glands unremarkable. There is no hydronephrosis or nephrolithiasis on either side. The visualized ureters and urinary bladder appear unremarkable. Stomach/Bowel: There is moderate stool throughout the colon. There is no bowel obstruction or active inflammation. Appendectomy. Vascular/Lymphatic: Moderate aortoiliac atherosclerotic disease. The IVC is unremarkable. No portal venous gas. There is no adenopathy. Reproductive: The prostate and seminal vesicles are grossly remarkable. Other: There is skin thickening and subcutaneous stranding of the left anterior abdominal wall may be related to contusion. No fluid collection or hematoma. Musculoskeletal: Degenerative changes of the spine. No acute osseous pathology. Bilateral L5 pars defects with grade 1 anterolisthesis. IMPRESSION: 1. Percutaneous cholecystostomy with pigtail tip in the gallbladder fundus. 2. No hydronephrosis or nephrolithiasis. 3. No bowel obstruction. 4.  Aortic Atherosclerosis (ICD10-I70.0). Electronically Signed   By: Vanetta Chou M.D.   On: 02/15/2024 19:17   IR DIALY SHUNT INTRO NEEDLE/INTRACATH INITIAL W/IMG LEFT Result Date: 02/09/2024 INDICATION: Difficult cannulation, infiltration EXAM: Left upper arm AV fistulagram MEDICATIONS: None. ANESTHESIA/SEDATION: Total intra-service moderate Sedation Time: None. The patient's level of consciousness and vital signs were monitored continuously by radiology nursing throughout the procedure under my direct  supervision. FLUOROSCOPY: Radiation Exposure Index (as provided by the fluoroscopic device): 13 mGy Kerma COMPLICATIONS: None immediate. PROCEDURE: Informed written consent was obtained from the patient after a thorough discussion of the procedural risks, benefits and alternatives. All questions were addressed. Maximal Sterile Barrier Technique was utilized including caps, mask, sterile gowns, sterile gloves, sterile drape, hand hygiene and skin antiseptic. A timeout was performed prior to the initiation of the procedure. Under sterile conditions, percutaneous Angiocath access performed of the left upper arm patent brachiocephalic fistula. Contrast injection performed for a complete fistulagram. Arterial anastomosis to the brachial artery at the elbow is widely patent. Outflow cephalic vein is patent in the upper arm and across the shoulder. Patent cephalic arch. Centrally, the subclavian, innominate vein, and SVC are all patent.  No significant flow limiting stenosis, occlusion or thrombus. Access removed. Hemostasis obtained with manual compression. No immediate complication. Patient tolerated the procedure well. IMPRESSION: Patent left upper arm brachiocephalic AV fistula circuit. ACCESS: This access remains amenable to future percutaneous interventions as clinically indicated. Electronically Signed   By: CHRISTELLA.  Shick M.D.   On: 02/09/2024 09:19   CT ABDOMEN PELVIS WO CONTRAST Result Date: 02/05/2024 CLINICAL DATA:  Cholecystostomy tube not flushing. Evaluate for placement location. EXAM: CT ABDOMEN AND PELVIS WITHOUT CONTRAST TECHNIQUE: Multidetector CT imaging of the abdomen and pelvis was performed following the standard protocol without IV contrast. RADIATION DOSE REDUCTION: This exam was performed according to the departmental dose-optimization program which includes automated exposure control, adjustment of the mA and/or kV according to patient size and/or use of iterative reconstruction technique.  COMPARISON:  CT abdomen pelvis dated 02/02/2024. FINDINGS: Evaluation of this exam is limited in the absence of intravenous contrast. Lower chest: No acute abnormality. No intra-abdominal free air or free fluid. Hepatobiliary: The liver is unremarkable. Percutaneous cholecystostomy with pigtail tip in the gallbladder lumen in similar position as the prior CT. The gallbladder is filled with stones. There is thickened appearance of the gallbladder wall. Pancreas: Unremarkable. No pancreatic ductal dilatation or surrounding inflammatory changes. Spleen: Normal in size without focal abnormality. Adrenals/Urinary Tract: The adrenal glands are unremarkable. Mild right pelvicaliectasis. Excreted contrast noted in the renal collecting systems and urinary bladder. Stomach/Bowel: Dense oral contrast noted throughout the colon. There is no bowel obstruction or active inflammation. Appendectomy. Vascular/Lymphatic: Mild aortoiliac atherosclerotic disease. The IVC is unremarkable. No portal gas. There is no adenopathy. Reproductive: The prostate and seminal vesicles are grossly unremarkable Other: None Musculoskeletal: Degenerative changes of the spine. Bilateral L5 pars defects with grade 1 anterolisthesis. No acute osseous pathology. IMPRESSION: 1. Percutaneous cholecystostomy with pigtail tip in the gallbladder lumen in similar position as the prior CT. 2. Cholelithiasis. 3. No bowel obstruction. 4.  Aortic Atherosclerosis (ICD10-I70.0). Electronically Signed   By: Vanetta Chou M.D.   On: 02/05/2024 20:04   IR EXCHANGE BILIARY DRAIN Result Date: 02/03/2024 INDICATION: History of acute cholecystitis, post ultrasound fluoroscopic guided cholecystostomy tube placement on 12/31/2023. EXAM: FLUOROSCOPIC GUIDED CHOLECYSTOSTOMY TUBE EXCHANGE COMPARISON:  12/31/2023, 02/02/2024 MEDICATIONS: None ANESTHESIA/SEDATION: None CONTRAST:  20mL OMNIPAQUE  IOHEXOL  300 MG/ML SOLN - administered into the gallbladder lumen. FLUOROSCOPY  TIME:  Three mGy reference air kerma COMPLICATIONS: None immediate. PROCEDURE: The patient was positioned supine on the fluoroscopy table. The external portion of the existing cholecystostomy tube as well as the surrounding skin was prepped and draped in usual sterile fashion. A time-out was performed prior to the initiation of the procedure. A preprocedural spot fluoroscopic image was obtained of the right upper abdominal quadrant existing cholecystostomy tube. The skin surrounding the cholecystostomy tube was anesthetized with 1% lidocaine  with epinephrine . The external portion of the cholecystostomy tube was cut and cannulated with a short Amplatz wire which was advanced through the tube and coiled within the gallbladder lumen Next, under intermittent fluoroscopic guidance, the existing 10 French cholecystostomy tube was exchanged for a new 10 [place] cholecystostomy tube which was repositioned into the more central aspect of the gallbladder lumen. Contrast injection confirms appropriate positioning and functionality of the cholecystomy tube. The cholecystostomy tube was flushed with a small amount of saline and reconnected to a suction bulb. The cholecystostomy tube was secured with an interrupted suture and a Stat Lock device. A dressing was applied. The patient tolerated the procedure well without immediate postprocedural complication.  FINDINGS: Preprocedural spot fluoroscopic image demonstrates unchanged positioning of cholecystostomy tube with end coiled and locked over the expected location of the fundus of the gallbladder There was purulent aspirate from the drain. After fluoroscopic guided exchange, the new 10 [place] cholecystostomy tube is more ideally positioned with end coiled and locked within the central aspect of the gallbladder lumen. Post exchange cholangiogram demonstrates appropriate positioning and functionality of the new cholecystostomy tube. IMPRESSION: Successful fluoroscopic guided exchange  of 10 Fr percutaneous cholecystostomy tube. PLAN: Keep to bulb suction until purulent output clears, then switch to bag drainage. Ester Sides, MD [facility] Electronically Signed   By: Ester Sides M.D.   On: 02/03/2024 22:20   CT ABDOMEN PELVIS W CONTRAST Result Date: 02/02/2024 CLINICAL DATA:  Provided history: purulence from cholecystostomy tube, rule out abscess EXAM: CT ABDOMEN AND PELVIS WITH CONTRAST TECHNIQUE: Multidetector CT imaging of the abdomen and pelvis was performed using the standard protocol following bolus administration of intravenous contrast. RADIATION DOSE REDUCTION: This exam was performed according to the departmental dose-optimization program which includes automated exposure control, adjustment of the mA and/or kV according to patient size and/or use of iterative reconstruction technique. CONTRAST:  75mL OMNIPAQUE  IOHEXOL  350 MG/ML SOLN COMPARISON:  Most recent abdominal imaging MRI 11/11/2023, abdominopelvic CT 11/06/2020 FINDINGS: Lower chest: 4 mm subpleural left lower lobe nodule, series 4, image 11, unchanged from 2022 and considered definitively benign. No pleural fluid or basilar opacity. Hepatobiliary: Cholecystostomy tube is coiled in the gallbladder. The gallbladder is filled with innumerable gallstones. Soft tissue thickening, stranding, and minimal fluid is seen adjacent to the portion of the tubing outside the gallbladder extending to the subcutaneous tissues. No definite pericholecystic collection. Suggestion of mild pericholecystic fat stranding. Gallbladder wall is not well assessed on the current exam. No common bile duct dilatation. No intrahepatic biliary ductal dilatation. Diffuse hepatic steatosis. No evidence of intrahepatic collection. Pancreas: No ductal dilatation or inflammation. Spleen: Mildly enlarged, 13.5 cm AP. Tiny hypodensity in the inferior spleen is too small to characterize but typically of  no clinical significance. Adrenals/Urinary Tract: No adrenal nodule. No hydronephrosis or evidence of renal inflammation. Absent renal excretion on delayed phase imaging. No renal calculi. Partially distended urinary bladder. No bladder wall thickening. Stomach/Bowel: Physiologic appearance of the stomach. There is no small bowel obstruction or inflammation. Moderate formed stool in the colon. Scattered colonic diverticula. No diverticulitis. Appendectomy. Vascular/Lymphatic: Advanced aortic atherosclerosis. No aortic aneurysm. Patent portal and splenic veins. No suspicious lymphadenopathy. Reproductive: Prostate is unremarkable. Other: No free air. No significant ascites. Soft tissue density in gas within the anterior abdominal wall likely related to medication injections. Musculoskeletal: No acute osseous findings. Right hip osteoarthritis. Lumbar degenerative change. Chronic bilateral L5 pars defects. IMPRESSION: 1. Cholecystostomy tube is coiled in the gallbladder. The gallbladder is filled with innumerable gallstones. Soft tissue thickening, stranding, and minimal fluid is seen adjacent to the portion of the tubing outside the gallbladder extending to the subcutaneous tissues. No definite pericholecystic collection. Suggestion of mild pericholecystic fat stranding. 2. Hepatic steatosis. 3. Mild splenomegaly. 4. Colonic diverticulosis without diverticulitis. 5. Absent renal excretion on delayed phase imaging, consistent with end-stage renal disease. Aortic Atherosclerosis (ICD10-I70.0). Electronically Signed   By: Andrea Gasman M.D.   On: 02/02/2024 19:51    Labs:  Basic Metabolic Panel: Recent Labs  Lab 02/17/24 1259 02/20/24 0730  NA 132* 137  K 4.6 4.4  CL 98 105  CO2 26 24  GLUCOSE 163* 95  BUN 57* 56*  CREATININE 6.57* 6.24*  CALCIUM  8.7* 7.8*  PHOS 2.7 4.7*    CBC: Recent Labs  Lab 02/16/24 0603 02/17/24 1259 02/18/24 1029 02/20/24 0730  WBC 4.4 4.9  --  4.9  HGB 8.3* 7.6*  8.8* 7.8*  HCT 26.1* 23.8* 28.1* 24.8*  MCV 94.9 94.8  --  97.3  PLT 34* 47*  --  104*    CBG: No results for input(s): GLUCAP in the last 168 hours.   Brief HPI:   Phillip [name] is a 68 y.o. right-handed male with history significant for end-stage renal disease with hemodialysis Tuesday Thursday Saturday hyperlipidemia diabetes mellitus hypertension bradycardia CAD with CABG 2003 maintained on aspirin  as well as Plavix , recent acute cholecystitis June 2025 requiring cholecystostomy tube placement, anemia of chronic disease, BPH, chronic diastolic congestive heart failure, aortic stenosis (TAVR postponed due to left third toe gangrene) CAD status post PCI to the left tibia fibula angioplasty 7/25 as well as left toe amputation 2019.  Per chart review patient lives with spouse.  1 level home 2 steps to entry.  Ambulates with assistive device.  Independent with ADLs.  Presented 01/14/2024 for elective left third toe amputation due to nonhealing ulcer with gangrenous changes.  Noninvasive vascular study showed noncompressible vessels hence underwent angiogram the left lower extremity with transluminal angioplasty/stent placement to the left tibioperoneal trunk 7/25 at [facility].  Underwent left third toe amputation including metatarsal head 01/14/2024.  Hospital course nephrology consulted with dialysis ongoing.  Due to nonhealing left toe wound underwent left transmetatarsal amputation 01/16/2024 and wound VAC applied removed 01/23/2024.  Follow-up cardiology services for severe aortic stenosis and recent delay in TAVR procedure due to left toe gangrene was cleared for surgery undergoing TAVR for 01/20/2024 per Dr. Shyrl.  Close monitoring of recent left TMA with poor healing and limb was not felt to be salvageable undergoing left below-knee amputation 01/30/2024 per Dr. Serene with limb guard applied.  Acute on chronic anemia latest hemoglobin 7.9.  Remained on aspirin  and Plavix  as prior to admission.   Subcutaneous heparin  added for DVT prophylaxis.  Radiology service follow-up for percutaneous cholecystostomy tube from recent acute cholecystitis June 2025 and currently remain on Augmentin  as well as linezolid  for 3 to 4 weeks.  Patient had remained afebrile with no leukocytosis some erythema around the percutaneous biliary drain site with some purulent drainage soaking the gauze.  CT of the abdomen pelvis showed some soft tissue thickening and stranding with minimal fluid seen adjacent to the portion of the tubing.  Therapy evaluations completed due to patient's decreased functional mobility was admitted for a comprehensive rehab program   Hospital Course: Phillip [name] was admitted to rehab 02/05/2024 for inpatient therapies to consist of PT, ST and OT at least three hours five days a week. Past admission physiatrist, therapy team and rehab RN have worked together to provide customized collaborative inpatient rehab.  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  Patient remained on aspirin  Plavix  therapy.  Limb guard to BKA site follow-up vascular surgery.  Subcutaneous heparin  for DVT prophylaxis no bleeding episodes.  Pain management with the use of low-dose gabapentin  added for phantom pain as well as oxycodone  for breakthrough pain.  Hospital course history of aortic stenosis status post TAVR procedure 01/20/2024 per cardiothoracic surgery Dr. Shyrl.  No chest pain or shortness of breath.  Hemodialysis ongoing as per renal services.  There  was some difficulty with patient's dialysis access interventional radiology consulted for [facility] insertion.  Lipitor as advised for hyperlipidemia.  Blood pressure controlled on Coreg  and monitored as well as Imdur  decreased to 30 mg daily.  Hypothyroidism with Synthroid  as indicated.  Acute on chronic anemia Aranesp  as indicated follow-up CBC.  Patient did have a history of CAD with  CABG as noted above no chest pain or shortness of breath.  Chronic diastolic congestive heart failure monitored for any signs of fluid overload follow-up cardiology services.  Recent acute cholecystitis June 2025 maintained on Augmentin  and doxycycline  as directed until 03/01/2024 and radiology follow-up for percutaneous cholecystostomy tube for recent cholecystitis plan for suction drainage versus changed to gravity bag.   Blood pressures were monitored on TID basis and remained controlled and monitored     Rehab course: During patient's stay in rehab weekly team conferences were held to monitor patient's progress, set goals and discuss barriers to discharge. At admission, patient required moderate assist squat pivot transfers minimal assist step pivot transfers min mod assist 14 feet rolling walker  He/She  has had improvement in activity tolerance, balance, postural control as well as ability to compensate for deficits. He/She has had improvement in functional use RUE/LUE  and RLE/LLE as well as improvement in awareness.  Patient completed transfer to wheelchair with minimal assist for board placement contact-guard transfer.  Slide board transfer training to mat table with assistance for placement in wheelchair minimal assist for transfer due to incline grade practice.  Completed lateral scoot on mat table with lift to increase initiation and sit to stand ability to perform slide board transfers with increased safety.  Completed lower body dressing and bathing at bedside for ease of donning and doffing pants.  Completes lower body bathing with set up.  Max assist to doff shoes and sock on right lower extremity.  Doffed shorts with modified independence.  Performs task with lateral weight shifting.  Patient removed limb guard edge of bed with minimal assist.  During physical therapy sessions with emphasis on limb loss education functional mobility transfers generalized strengthening.  Patient received  training in regards to stump shrinker.  Propels wheelchair with supervision.  Patient stood in parallel bars x 3 trials.  For family teaching completed plan discharge to home       Disposition:  Discharge disposition: 06-Home-Health Care Svc        Diet: Renal diet  Special Instructions: No driving smoking or alcohol  Continue hemodialysis as directed  In regards to drain care.  Flush drain daily with 5 cc normal saline and record output daily dressing changes every 2 to 3 days if soiled.  Medications at discharge 1.  Tylenol  as needed 2 aspirin  81 mg daily 3.  Lipitor 20 mg p.o. daily 4.  Coreg  3.125 mg p.o. bedtime 5.  Plavix  75 mg p.o. daily 6.  Colace 100 mg daily 7.  Synthroid  50 mcg p.o. daily 8.  Robaxin  500 mg every 8 hours as needed muscle spasms 9.  Oxycodone  5  mg every 12 hours as needed pain 10.  Renvela  800 mg p.o. 3 times daily 11.  Nitroglycerin  as needed 12.  Augmentin  500-125 mg 1 tablet twice daily until 03/01/2024 and stop 13.  B complex with vitamin C 1 tablet daily 14.  Vitamin D  1000 units daily 15.  Neurontin  200 mg nightly 16.Xalatan  ophthalmic solution 0.005% 1 drop both eyes bedtime 17.  Doxycycline  100 mg every 12 hours until 03/01/2024 and stop  18.  Imdur  30 mg daily   30-35 minutes were spent completing discharge summary and discharge planning  Discharge Instructions     Ambulatory referral to Physical Medicine Rehab   Complete by: As directed    Moderate complexity follow up 1 to 2 weeks left BKA        Follow-up Information     Raulkar, Sven SQUIBB, MD Follow up.   Specialty: Physical Medicine and Rehabilitation Why: Office to call for appointment Contact information: [address] [place] [address] [phone]         [name] LABOR., MD Follow up.   Specialty: Internal Medicine Why: Call for appointment Contact information: [address] [place] [address] [phone]         Serene Gaile ORN, MD Follow up.   Specialties: Vascular Surgery, Cardiology Why: Call for appointment Contact information: [address] [place] [address] [phone]         Shyrl Linnie KIDD, MD Follow up.   Specialty: Cardiothoracic Surgery Why: Call for appointment Contact information: [address] [place] [address] [phone]         Rayburn Pac, MD Follow up.   Specialty: Nephrology Why: Call for appointment Contact information: [address] [place] [address] [phone]         Thukkani, Arun K, MD Follow up.   Specialty: Cardiology Why: Call for appointment Contact information: [address] [place] [address] [phone]         Jennefer Ester PARAS, MD Follow up.   Specialties: Interventional Radiology, Diagnostic Radiology, Radiology Why: Call for appointment Contact information: [address] [place] [address] [place] [address] [phone]                 Signed: Toribio PARAS Deagen Krass 02/23/2024, 4:45 AM

## 2024-02-05 NOTE — Progress Notes (Signed)
 Inpatient Rehab Admissions Coordinator:    I have a bed available for pt to admit to CIR today. Dr. Sigdel in agreement and [facility] aware.  I've notified pt/family and they are in agreement to admit today.  I will make arrangements.    Reche Lowers, PT, DPT Admissions Coordinator [phone] 02/05/24  10:04 AM

## 2024-02-05 NOTE — Progress Notes (Signed)
 Contacted [facility] to be advised of pt's d/c to inpt rehab today. Will notify HD clinic of pt's d/c date from rehab once confirmed. Will assist as needed.   Randine Mungo Dialysis Navigator [phone]

## 2024-02-05 NOTE — Progress Notes (Signed)
 Babs Arthea DASEN, MD  Physician Physical Medicine and Rehabilitation   PMR Pre-admission    Signed   Date of Service: 02/05/2024 10:08 AM  Related encounter: Admission (Current) from 01/14/2024 in [facility] 4E CV SURGICAL PROGRESSIVE CARE   Signed     Expand All Collapse All  PMR Admission Coordinator Pre-Admission Assessment   Patient: Phillip [name] is an 68 y.o., male MRN: [id number] DOB: [birth year] Height: 6' (182.9 cm) Weight: 70.3 kg                                                                                                                                                  Insurance Information HMO:     PPO: yes     PCP:      IPA:      80/20:      OTHER:  PRIMARY: [employer] PPO      Policy#: [id number]      Subscriber: patient CM Name: Ellouise Pouch      Phone#: [phone]     Fax#: [phone] Pre-Cert#: [id number] from Ellouise Pouch approved for 7 days; HTA case manager has EPIC access      Employer: disabled Benefits:  Phone #: [phone]     Name: Verified by phone with Ezella Gemma. Date: 06/04/23     Deduct: $0      Out of Pocket Max: $3400 (met $3400)      Life Max: n/a  CIR: $325/day for days 1-6      SNF: $0 for days 1-20; $214/day for days 21-100 Outpatient: med nec     Co-Pay: $15/visit Home Health: 100%      Co-Pay: none DME: 75%     Co-Pay: 15% Providers: in network  SECONDARY:       Policy#:       Phone#:    Artist:       Phone#:    The Data processing manager" for patients in Inpatient Rehabilitation Facilities with attached "Privacy Act Statement-Health Care Records" was provided and verbally reviewed with: Patient   Emergency Contact Information Contact Information       Name Relation Home Work Mobile    [place] A Spouse [phone]   [phone]         Other Contacts   None on File      Current Medical History  Patient Admitting Diagnosis: Left foot TMR and TVAR   History of Present Illness:  A  68 y.o. male with past medical history of CHF, anemia, ESRD on HD on TTHSat, PVD, diabetes mellitus type 2, severe aortic stenosis, obesity, CAD s/p CABG, cholelithiasis.  Had previously had noninvasive vascular study which showed noncompressible vessels so he underwent an angiogram of LLE with transluminal angioplasty/stent placement to the left tibial peroneal trunk on 7/25 at [facility].  Patient was admitted to  [facility] on 01/14/24 for gangrenous left third toe.  On 8/13 he had left third toe amputation including metatarsal head by Dr. Serene.  [facility] course nephrology consulted with dialysis ongoing as directed, TTS schedule.  Due to nonhealing left toe wound underwent left transmetatarsal amputation 01/16/2024.  Follow-up cardiology services for severe aortic stenosis.  Had a planned TAVR procedure which had been delayed due to left toe gangrene but was cleared for surgery and underwent TAVR 01/20/2024 per Dr. Shyrl.  Close monitoring of recent left TMA with poor healing and limb was not felt to be salvageable, so he underwent L BKA 01/30/2024 per Dr. Serene and limb guard applied.  Acute on chronic anemia, latest hemoglobin 7.9.  Patient remains on aspirin  and Plavix  as prior to admission.  SQ heparin  added for DVT prophylaxis.  Radiology service follow-up for percutaneous cholecystostomy tube from recent acute cholecystitis in June 2025 and currently remains on Augmentin  and linezolid  for 3 to 4 weeks.  CT of the abdomen pelvis showed some soft tissue thickening and stranding with minimal fluid seen adjacent to the portion of the tubing.  No peri-cholecystic collection and has remained afebrile with no leukocytosis some erythema around the percutaneous biliary drain site with some purulent drainage soaking the gauze so chole drain was changed on 9/2. Therapy evaluations completed and pt was recommended for a comprehensive rehab program  Glasgow Coma Scale Score: 15   Patient's medical record from  [facility] has been reviewed by the rehabilitation admission coordinator and physician.   Past Medical History      Past Medical History:  Diagnosis Date   Abdominopelvic abscess (HCC) 09/29/2018   Abscess of appendix 09/22/2018   Acquired spondylolisthesis of lumbosacral region 03/20/2020   Acute cholecystitis 11/15/2023   Acute kidney injury (HCC) 10/07/2018   Acute kidney injury superimposed on chronic kidney disease (HCC) 11/06/2020   Acute kidney injury superimposed on CKD (HCC) 02/18/2020   Acute on chronic systolic (congestive) heart failure (HCC) 07/26/2015    Formatting of this note might be different from the original. Formatting of this note might be different from the original. Formatting of this note might be different from the original. revenkar EF 35% Formatting of this note might be different from the original. revenkar EF 35%     Acute on chronic systolic CHF (congestive heart failure) (HCC) 08/12/2023   Anasarca 07/01/2023   Anemia of chronic disease 08/18/2019   Anemia, chronic disease 08/18/2019   Aortic atherosclerosis (HCC)     Aortic stenosis      a.) TTE 09/15/2018: mild-mod (MPG 19); b.) TTE 03/21/2021: mild-mod (MPG 14.3); c.) TTE 04/24/2022: mild- mod (MPG 15)   Aortic stenosis, moderate 10/15/2019   Appendicitis 09/08/2018   ARF (acute renal failure) (HCC) 02/19/2020   Asthma     Atherosclerosis of native arteries of the extremities with ulceration (HCC) 02/24/2018    Formatting of this note might be different from the original.  Last Assessment & Plan:   His ABIs today are 1.07 on the right and 0.99 on the left with biphasic waveforms.  Although these pressures may be somewhat elevated from medial calcification, his flow does appear to be reasonably good.  His left ABI was 0.58 prior to intervention.  At this point, we will stretch out his follow-up and see hi   Benign hypertension with CKD (chronic kidney disease) stage IV (HCC) 02/24/2018   Benign  prostatic hyperplasia without lower urinary tract symptoms 01/14/2018   Bilateral  lower extremity edema 11/04/2018   Bradycardia 03/25/2018   Bruit of right carotid artery 07/26/2015   CAD (coronary artery disease) 2003    a.) s/p 4v CABG 2003   Cardiac murmur 07/26/2015   Choledocholithiasis 11/12/2023   Chronic combined systolic and diastolic CHF (congestive heart failure) (HCC) 07/26/2015    a.) TTE 09/15/2018: EF 50-55%, mod LVH, RVE, BAE, mild-mod TR, AoV sclerosis, G1DD; b.) MPI 10/27/2019: EF <30%; c.) TTE 03/21/2021: EF 35-40%, post AK, inf HK, mod LVH, mod red RVSF, mod LAE, mod Aov sclerosis, G2DD; c.) TTE 04/24/2022: EF 35-40%, post AK, glok HK, mod LVE, mod red RVSF, mild-mod MR, Aov sclerosis, G3DD   Chronic idiopathic constipation 11/02/2018   Chronic low back pain without sciatica 12/02/2018   Chronic pain of right hip 03/31/2020   Chronic systolic heart failure (HCC) 07/26/2015    Formatting of this note might be different from the original. revenkar EF 35%     CKD (chronic kidney disease) stage 4, GFR 15-29 ml/min (HCC) 10/07/2018   Congestive heart failure (HCC) 07/26/2015    Last Assessment & Plan:   Poor cardiac function could certainly be contributing to poor blood supply to the feet and toes as well.     Formatting of this note might be different from the original.  Formatting of this note might be different from the original.  Last Assessment & Plan:   Poor cardiac function could certainly be contributing to poor blood supply to the feet and toes as well.     Last   Coronary artery disease     Diabetes mellitus without complication (HCC)     Dysphagia 07/26/2015   Encounter for long-term (current) use of insulin  (HCC) 09/27/2020   Erectile dysfunction 07/14/2017   ESRD on dialysis [facility]) 11/15/2023   Essential hypertension 02/24/2018   Fever 11/07/2020   Foot ulcer (HCC) 09/27/2020    left foot  Formatting of this note might be different from the original.  Formatting  of this note might be different from the original.  left foot     Foot ulcer, left (HCC)     GERD (gastroesophageal reflux disease)     Heart murmur 07/26/2015   Hematuria 07/27/2015   High risk medication use 07/26/2015   History of marijuana use     Hyperkalemia 05/29/2020   Hyperlipidemia 07/26/2015   Hyperlipidemia associated with type 2 diabetes mellitus (HCC) 02/18/2020   Hypertension     Hypertension associated with diabetes (HCC)     Hypoglycemia 05/07/2019   Hypothyroidism 11/14/2015   Hypothyroidism (acquired) 11/14/2015   Insomnia 11/02/2018   Kidney insufficiency     Lumbar spondylosis 03/20/2020   Malaise and fatigue 03/25/2018   Malnutrition of moderate degree 08/20/2023   Mitral regurgitation 05/12/2019   Moderate aortic regurgitation 05/12/2019   Myocardial infarction due to demand ischemia (HCC) 09/26/2020    a.) Type II NSTEMI; b.) troponins were trended 0.54 --> 0.56 --> 0.52 ng/mL   Non-compliance 05/05/2019   NSTEMI (non-ST elevated myocardial infarction) (HCC) 09/27/2020   Osseous and subluxation stenosis of intervertebral foramina of lumbar region 03/20/2020   Osteoarthritis 10/06/2019   Pancytopenia (HCC) 08/12/2023   Peripheral vascular disease (HCC)     Personal history of tobacco use, presenting hazards to health 09/27/2020   Polyneuropathy in diabetes (HCC) 05/07/2019   Primary osteoarthritis involving multiple joints 10/06/2019   Primary osteoarthritis of right hip 02/21/2020   Pulmonary HTN (HCC)      a.) TTE 05/12/2019: PASP  71; b.) TTE 09/27/2020: PASP >70; c.) TTE 03/21/2021: RVSP 43; d.) TTE 04/24/2022: RVSP 80.6   Puncture wound of right hip 04/02/2018   PVD (peripheral vascular disease) with claudication (HCC)      a.) s/p PTA 03/02/2018 - balloon angioplasty LEFT below knee popliteal artery; b.) s/p PTA 09/30/2022: baloon angioplasty LEFT tibioperoneal trunck, most proximal peroneal artery, and LEFT popliteal artery.   Regional wall motion  abnormality of heart 05/07/2019   Right groin pain 02/18/2020   S/P CABG x 4 2003   S/P TAVR (transcatheter aortic valve replacement) 01/20/2024    TAVR with a 29 mm Edwards Sapien 3 Ultra Resilia THV via the TF by Dr. Wendel and Dr. Shyrl   Screening for colon cancer 02/18/2018   Sepsis (HCC) 11/15/2023   Sleep apnea 07/26/2015    a.) does not require nocturnal PAP therapy   Subacute osteomyelitis of left foot (HCC) 03/25/2018   Thrombocytopenia (HCC)     Type 2 diabetes mellitus with hyperlipidemia (HCC) 07/26/2015   Type 2 diabetes mellitus with stage 4 chronic kidney disease, with long-term current use of insulin  (HCC) 07/26/2015   Type II diabetes mellitus with neurological manifestations (HCC) 02/18/2020   Vitamin B12 deficiency 06/16/2019   Vitamin D deficiency 05/07/2018          Has the patient had major surgery during 100 days prior to admission? Yes   Family History  family history includes Diabetes in his mother; Heart attack in his brother; Heart disease in his brother; Hyperlipidemia in his mother; Hypertension in his mother; Lung disease in his father.     Current Medications   Current Medications    Current Facility-Administered Medications:    (feeding supplement) PROSource Plus liquid 30 mL, 30 mL, Oral, BID BM, Rhyne, Samantha J, PA-C, 30 mL at 02/03/24 1352   0.9 %  sodium chloride  infusion, 250 mL, Intravenous, PRN, Rhyne, Samantha J, PA-C   acetaminophen  (TYLENOL ) tablet 650 mg, 650 mg, Oral, Q6H PRN, 650 mg at 02/01/24 2058 **OR** acetaminophen  (TYLENOL ) suppository 650 mg, 650 mg, Rectal, Q6H PRN, Rhyne, Samantha J, PA-C   albuterol  (PROVENTIL ) (2.5 MG/3ML) 0.083% nebulizer solution 2.5 mg, 2.5 mg, Nebulization, Q2H PRN, Rhyne, Samantha J, PA-C   amoxicillin -clavulanate (AUGMENTIN ) 500-125 MG per tablet 1 tablet, 1 tablet, Oral, BID, Odell Celinda Balo, MD, 1 tablet at 02/05/24 0931   aspirin  EC tablet 81 mg, 81 mg, Oral, Daily, Rhyne, Samantha J,  PA-C, 81 mg at 02/05/24 0930   atorvastatin  (LIPITOR) tablet 20 mg, 20 mg, Oral, Daily, Rhyne, Samantha J, PA-C, 20 mg at 02/05/24 0930   bisacodyl  (DULCOLAX) EC tablet 5 mg, 5 mg, Oral, Daily PRN, Rhyne, Samantha J, PA-C   bisacodyl  (DULCOLAX) suppository 10 mg, 10 mg, Rectal, Daily PRN, Rhyne, Samantha J, PA-C   carvedilol  (COREG ) tablet 3.125 mg, 3.125 mg, Oral, BID, Rhyne, Samantha J, PA-C, 3.125 mg at 02/05/24 0930   clopidogrel  (PLAVIX ) tablet 75 mg, 75 mg, Oral, Daily, Rhyne, Samantha J, PA-C, 75 mg at 02/05/24 0930   heparin  injection 5,000 Units, 5,000 Units, Subcutaneous, Q8H, Sigdel, Santosh, MD   HYDROmorphone  (DILAUDID ) injection 0.5 mg, 0.5 mg, Intravenous, Q3H PRN, Rhyne, Samantha J, PA-C, 0.5 mg at 02/05/24 0929   isosorbide  mononitrate (IMDUR ) 24 hr tablet 120 mg, 120 mg, Oral, Daily, Rhyne, Samantha J, PA-C, 120 mg at 02/05/24 0930   levothyroxine  (SYNTHROID ) tablet 50 mcg, 50 mcg, Oral, Daily, Rhyne, Samantha J, PA-C, 50 mcg at 02/05/24 0600   linezolid  (ZYVOX )  tablet 600 mg, 600 mg, Oral, Q12H, Odell Celinda Balo, MD, 600 mg at 02/05/24 9068   menthol -cetylpyridinium (CEPACOL) lozenge 3 mg, 1 lozenge, Oral, PRN, Rhyne, Samantha J, PA-C, 3 mg at 01/18/24 1035   nitroGLYCERIN  (NITROSTAT ) SL tablet 0.4 mg, 0.4 mg, Sublingual, Q5 min PRN, Rhyne, Samantha J, PA-C   ondansetron  (ZOFRAN ) injection 4 mg, 4 mg, Intravenous, Q6H PRN, Rhyne, Samantha J, PA-C   Oral care mouth rinse, 15 mL, Mouth Rinse, PRN, Odell Celinda Balo, MD   oxyCODONE  (Oxy IR/ROXICODONE ) immediate release tablet 5-10 mg, 5-10 mg, Oral, Q4H PRN, Rhyne, Samantha J, PA-C, 10 mg at 02/03/24 2338   phenol (CHLORASEPTIC) mouth spray 1 spray, 1 spray, Mouth/Throat, PRN, Rhyne, Samantha J, PA-C   sevelamer  carbonate (RENVELA ) tablet 800 mg, 800 mg, Oral, TID WC, Rhyne, Samantha J, PA-C, 800 mg at 02/05/24 0930   sodium chloride  flush (NS) 0.9 % injection 3 mL, 3 mL, Intravenous, Q12H, Rhyne, Samantha J, PA-C, 3 mL at  02/05/24 0931   sodium chloride  flush (NS) 0.9 % injection 3 mL, 3 mL, Intravenous, PRN, Rhyne, Samantha J, PA-C   tamsulosin  (FLOMAX ) capsule 0.4 mg, 0.4 mg, Oral, QPC breakfast, Rhyne, Samantha J, PA-C, 0.4 mg at 02/05/24 0930     Patients Current Diet:  Diet Order                  Diet renal with fluid restriction Fluid restriction: 1200 mL Fluid; Room service appropriate? No; Fluid consistency: Thin  Diet effective now                         Precautions / Restrictions Precautions Precautions: Fall, Other (comment) Precaution/Restrictions Comments: R chole drain- abdomen Other Brace: limb protector, needs to don for OOB Restrictions Weight Bearing Restrictions Per Provider Order: No LLE Weight Bearing Per Provider Order: Non weight bearing LLE Partial Weight Bearing Percentage or Pounds: heel only Other Position/Activity Restrictions: limb guard when transferring OOB or to [facility]    Has the patient had 2 or more falls or a fall with injury in the past year?No   Prior Activity Level Community (5-7x/wk): Went to HD 3 times a week, wife usually drives him   Prior Functional Level Prior Function Prior Level of Function : Independent/Modified Independent Mobility Comments: Ambulates with AD. No falls in the past 71mo. ADLs Comments: Ind with ADLs, wife manages medications and assists with IADLs   Self Care: Did the patient need help bathing, dressing, using the toilet or eating?  Needed some help   Indoor Mobility: Did the patient need assistance with walking from room to room (with or without device)? Independent   Stairs: Did the patient need assistance with internal or external stairs (with or without device)? Needed some help   Functional Cognition: Did the patient need help planning regular tasks such as shopping or remembering to take medications? Needed some help   Patient Information Are you of Hispanic, Latino/a,or Spanish origin?: A. No, not of Hispanic,  Latino/a, or Spanish origin What is your race?: B. Black or African American Do you need or want an interpreter to communicate with a doctor or health care staff?: 0. No Patient information obtained via proxy : no   Patient's Response To:  Health Literacy and Transportation Is the patient able to respond to health literacy and transportation needs?: Yes Health Literacy - How often do you need to have someone help you when you read instructions, pamphlets, or other  written material from your doctor or pharmacy?: Always In the past 12 months, has lack of transportation kept you from medical appointments or from getting medications?: No In the past 12 months, has lack of transportation kept you from meetings, work, or from getting things needed for daily living?: No Higher education careers adviser obtained via proxy: no   Journalist, newspaper / Equipment Home Equipment: Agricultural consultant (2 wheels), Shower seat, BSC/3in1, Grab bars - toilet, Other (comment)   Prior Device Use: Indicate devices/aids used by the patient prior to current illness, exacerbation or injury? Cane   Current Functional Level Cognition   Orientation Level: Oriented X4    Extremity Assessment (includes Sensation/Coordination)   Upper Extremity Assessment: Generalized weakness  Lower Extremity Assessment: Defer to PT evaluation     ADLs   Overall ADL's : Needs assistance/impaired Eating/Feeding: Set up, Sitting Grooming: Wash/dry face, Oral care, Set up, Sitting Upper Body Bathing: Set up, Sitting Lower Body Bathing: Maximal assistance, Sitting/lateral leans Upper Body Dressing : Set up, Sitting Lower Body Dressing: Maximal assistance, Sitting/lateral leans Lower Body Dressing Details (indicate cue type and reason): pt reaching down to RLE but unable to complete Toilet Transfer: Minimal assistance, Rolling walker (2 wheels), Stand-pivot, BSC/3in1 Toilet Transfer Details (indicate cue type and reason): dense  cues for LLE placement with steps and pt with tendency to point toe to floor despite use of darco shoe Toileting- Clothing Manipulation and Hygiene: Maximal assistance, Total assistance Toileting - Clothing Manipulation Details (indicate cue type and reason): Assist to steady in standing Functional mobility during ADLs: Moderate assistance, Rolling walker (2 wheels) General ADL Comments: Began balance training with ADLs in prep for LLE amputation     Mobility   Overal bed mobility: Needs Assistance Bed Mobility: Sit to Supine Supine to sit: Contact guard, Supervision, HOB elevated, Used rails Sit to supine: Contact guard assist, Used rails General bed mobility comments: Pt sitting EOB on arrival and departure.     Transfers   Overall transfer level: Needs assistance Equipment used: Rolling walker (2 wheels) Transfers: Sit to/from Stand, Bed to chair/wheelchair/BSC Sit to Stand: Mod assist Bed to/from chair/wheelchair/BSC transfer type:: Squat pivot Squat pivot transfers: Mod assist Step pivot transfers: Min assist Anterior-Posterior transfers: Contact guard assist  Lateral/Scoot Transfers: Min assist General transfer comment: Mod A with face to face transfer with counting and heavy cueing for sequencing. Pt able to stand fully 1x after 2x attempts with multi modal cues for sequencing pt use of RW and good wgt shifting without the need for assistance toward the R this session. Improved COM over BOS. Once in standing Min A for balance. CUes for safe sitting.     Ambulation / Gait / Stairs / Wheelchair Mobility   Ambulation/Gait Ambulation/Gait assistance: Min assist, Mod assist Gait Distance (Feet): 14 Feet Assistive device: Rolling walker (2 wheels) Gait Pattern/deviations: Step-to pattern, Decreased step length - left, Decreased stance time - left, Decreased weight shift to left, Decreased step length - right, Decreased stride length, Trunk flexed, Leaning posteriorly General Gait  Details: unable at this time. Gait velocity: decreased Gait velocity interpretation: <1.31 ft/sec, indicative of household ambulator     Posture / Balance Dynamic Sitting Balance Sitting balance - Comments: Pt sat Edge of bed Balance Overall balance assessment: Needs assistance Sitting-balance support: Feet supported Sitting balance-Leahy Scale: Good Sitting balance - Comments: Pt sat Edge of bed Standing balance support: Bilateral upper extremity supported, During functional activity, Reliant on assistive device for balance Standing balance-Leahy  Scale: Poor Standing balance comment: reliant on BUE     Special considerations/ Life events Continuous Drip IV  n/a, Wound Vac n/a, Skin L BKA incision, Diabetic management yes, and Special service needs HD TTS        Previous Home Environment (from acute therapy documentation) Living Arrangements: Spouse/significant other Available Help at Discharge: Family, Available 24 hours/day Type of Home: House Home Layout: One level Home Access: Stairs to enter Entrance Stairs-Rails: Right, Left, Can reach both Entrance Stairs-Number of Steps: 2 Bathroom Shower/Tub: Psychologist, counselling, Engineer, manufacturing systems: Handicapped height Bathroom Accessibility: Yes How Accessible: Accessible via walker Home Care Services: No   Discharge Living Setting Plans for Discharge Living Setting: Patient's home, House, Lives with (comment) (Lives with wife and special needs grandson.) Type of Home at Discharge: House Discharge Home Layout: One level Discharge Home Access: Stairs to enter Entrance Stairs-Rails: Right, Left, Can reach both Entrance Stairs-Number of Steps: 2 Discharge Bathroom Shower/Tub: Walk-in shower, Curtain Discharge Bathroom Toilet: Handicapped height Discharge Bathroom Accessibility: Yes How Accessible: Accessible via walker Does the patient have any problems obtaining your medications?: No   Social/Family/Support  Systems Patient Roles: Spouse, Other (Comment) Contact Information: Dalyn Becker - wife - [phone] Anticipated Caregiver: wife Ability/Limitations of Caregiver: Wife is currently not working, cares for 69 yo special needs grandson Caregiver Availability: 24/7 Discharge Plan Discussed with Primary Caregiver: Yes Is Caregiver In Agreement with Plan?: Yes Does Caregiver/Family have Issues with Lodging/Transportation while Pt is in Rehab?: No     Goals Patient/Family Goal for Rehab: PT/OT mod I and supervision goals Expected length of stay: 7 days Pt/Family Agrees to Admission and willing to participate: Yes Program Orientation Provided & Reviewed with Pt/Caregiver Including Roles  & Responsibilities: Yes     Decrease burden of Care through IP rehab admission: N/A     Possible need for SNF placement upon discharge:  Not planned     Patient Condition: This patient's medical and functional status has changed since the consult dated: 01/21/24 in which the Rehabilitation Physician determined and documented that the patient's condition is appropriate for intensive rehabilitative care in an inpatient rehabilitation facility. See History of Present Illness (above) for medical update. Functional changes are: pt min assist for lateral scoot transfers. Patient's medical and functional status update has been discussed with the Rehabilitation physician and patient remains appropriate for inpatient rehabilitation. Will admit to inpatient rehab today.   Preadmission Screen Completed By:  Reche FORBES Lowers, PT, DPT 02/05/2024 10:13 AM ______________________________________________________________________   Discussed status with Dr. Babs on 02/05/24 at 10:13 AM  and received approval for admission today.   Admission Coordinator:  Remiel Corti E Jahniya Duzan, PT, DPT time 10:13 AM Pattricia 02/05/24             Revision History

## 2024-02-05 NOTE — Progress Notes (Signed)
 Mobility Specialist Progress Note:    02/05/24 1045  Mobility  Activity Pivoted/transferred from bed to chair  Level of Assistance Standby assist, set-up cues, supervision of patient - no hands on  LLE Weight Bearing Per Provider Order NWB  Activity Response Tolerated well  Mobility Referral Yes  Mobility visit 1 Mobility  Mobility Specialist Start Time (ACUTE ONLY) 1045  Mobility Specialist Stop Time (ACUTE ONLY) 1055  Mobility Specialist Time Calculation (min) (ACUTE ONLY) 10 min   Pt received in bed, agreeable to mobility session. Transferred B>C via lateral scooting. Pt able to don limb protector with MinA. SV to transfer. Tolerated well, c/o LLE pain. Sitting up in chair with all needs met, call bell in reach.    Payam Gribble Mobility Specialist Please contact via Special educational needs teacher or  Rehab office at [phone]

## 2024-02-05 NOTE — TOC Transition Note (Signed)
 Transition of Care (TOC) - Discharge Note Rayfield Gobble RN, BSN Inpatient Care Management Unit 4E- RN Case Manager See Treatment Team for direct phone #   Patient Details  Name: Phillip [name] MRN: [id number] Date of Birth: 28-Apr-1956  Transition of Care [facility]) CM/SW Contact:  [name], RN Phone Number: 02/05/2024, 1:08 PM   Clinical Narrative:    Notified by CIR liaison that pt has auth from insurance for INPT rehab and bed available today for admit.  MD has cleared pt for transition to [facility] INPT rehab today.  Pt will transition to [facility] INPT rehab this afternoon once bed has been assigned.   [facility] has been following for [facility] needs with [facility] office referral- liaison updated on discharge to [facility] INPT rehab. [facility] can assist post rehab stay should HH be needed.   No further IP CM (INPT Care Mangement) needs noted.    Final next level of care: IP Rehab Facility Barriers to Discharge: Barriers Resolved   Patient Goals and CMS Choice Patient states their goals for this hospitalization and ongoing recovery are:: rehab then plan to return home   Choice offered to / list presented to : Patient      Discharge Placement               [facility] INPT rehab.         Discharge Plan and Services Additional resources added to the After Visit Summary for     Discharge Planning Services: CM Consult Post Acute Care Choice: IP Rehab          DME Arranged: N/A DME Agency: NA         HH Agency: [facility]        Social Drivers of Health (SDOH) Interventions SDOH Screenings   Food Insecurity: No Food Insecurity (01/15/2024)  Housing: Low Risk  (01/21/2024)  Transportation Needs: No Transportation Needs (01/15/2024)  Utilities: Not At Risk (01/15/2024)  Depression (PHQ2-9): Low Risk  (10/22/2018)  Financial Resource Strain: Low Risk  (09/26/2020)   Received from [facility]  Physical Activity: Inactive (09/26/2020)   Received from [facility]  Social Connections: Moderately Integrated (01/21/2024)  Stress: No Stress Concern Present (09/26/2020)   Received from [facility]  Tobacco Use: Medium Risk (01/30/2024)  Health Literacy: Low Risk  (09/26/2020)   Received from [facility]     Readmission Risk Interventions    02/05/2024    1:08 PM 08/13/2023    4:28 PM  Readmission Risk Prevention Plan  Transportation Screening Complete Complete  HRI or Home Care Consult  Complete  Palliative Care Screening  Not Applicable  Medication Review (RN Care Manager) Complete Complete  HRI or Home Care Consult Complete   SW Recovery Care/Counseling Consult Complete   Palliative Care Screening Not Applicable   Skilled Nursing Facility Not Applicable

## 2024-02-05 NOTE — Progress Notes (Signed)
 Urbano Albright, MD  Physician Physical Medicine and Rehabilitation   Consult Note    Addendum   Date of Service: 01/21/2024  2:51 PM  Related encounter: Admission (Current) from 01/14/2024 in [facility] 4E CV SURGICAL PROGRESSIVE CARE   Expand All Collapse All           Physical Medicine and Rehabilitation Consult Reason for Consult:Rehab Referring Physician: Dr Wendel     HPI: Phillip [name] is a 68 y.o. male with past medical history of CHF, anemia, ESRD on HD, PVD, diabetes mellitus type 2, severe aortic stenosis, obesity, CAD s/p CABG, cholelithiasis.  Patient was initially admitted 8/13 for gangrenous left third toe.  On 8/13 he had left third toe amputation including metatarsal head by Dr. Serene.  It was felt the patient may require TMA or BKA however patient refused initially.  Patient later agreed to TMA and this was completed by Dr. Serene on 8/15 placed.  Patient remained inpatient for planned TAVR for severe aortic stenosis.  TAVR was completed on 01/20/2024 via TF approach.  Patient seen by PT and felt to be a candidate for acute inpatient rehabilitation.  Required mod assist for sit to stand, min assist step pivot transfers, min assist for ambulation 3 feet with rolling walker.   Per chart review patient lives in a home with 2 steps to enter.  Family can assist after discharge.   Home: Home Living Family/patient expects to be discharged to:: Private residence Living Arrangements: Spouse/significant other Available Help at Discharge: Family, Available 24 hours/day Type of Home: House Home Access: Stairs to enter [employer] of Steps: 2 Entrance Stairs-Rails: Right, Left, Can reach both Home Layout: One level Bathroom Shower/Tub: Psychologist, counselling, Engineer, manufacturing systems: Handicapped height Home Equipment: Agricultural consultant (2 wheels), Shower seat, BSC/3in1, Grab bars - toilet (hurry-cane)  Functional History: Prior Function Prior Level of  Function : Independent/Modified Independent, Driving Mobility Comments: Ambulates with AD. No falls in the past 67mo. ADLs Comments: Indep with ADLs. Wife manages medications. Both share household chores. Functional Status:  Mobility: Bed Mobility Overal bed mobility: Needs Assistance Bed Mobility: Supine to Sit Supine to sit: HOB elevated, Used rails, Supervision General bed mobility comments: Pt sat up on R side of bed with increased time. Assist to manage lines/leads. Transfers Overall transfer level: Needs assistance Equipment used: Rolling walker (2 wheels) Transfers: Sit to/from Stand, Bed to chair/wheelchair/BSC Sit to Stand: Mod assist Bed to/from chair/wheelchair/BSC transfer type:: Step pivot Step pivot transfers: Min assist General transfer comment: Pt stood from lowest bed height. Cued proper hand placement using RW. Mod A for initial momentum to get to standing. Intermittent verbal cues for L Heel WB Ambulation/Gait Ambulation/Gait assistance: Min assist Gait Distance (Feet): 3 Feet Assistive device: Rolling walker (2 wheels) Gait Pattern/deviations: Step-to pattern, Decreased step length - right, Decreased step length - left, Decreased stance time - left, Decreased weight shift to left General Gait Details: Pt took short slow steps. Cues for sequencing and heel WB . Assist to support trunk, manuever RW, and manage lines/leads. Gait velocity: decreased Gait velocity interpretation: <1.31 ft/sec, indicative of household ambulator   ADL:   Cognition: Cognition Orientation Level: Oriented X4 Cognition Arousal: Alert Behavior During Therapy: WFL for tasks assessed/performed     Review of Systems  Constitutional:  Negative for chills and fever.  HENT:  Negative for hearing loss.   Eyes:  Positive for blurred vision. Negative for double vision.  Respiratory:  Negative for cough and  shortness of breath.   Cardiovascular:  Negative for chest pain.  Gastrointestinal:   Negative for abdominal pain, constipation, diarrhea, nausea and vomiting.  Genitourinary: Negative.   Musculoskeletal:  Positive for joint pain.  Skin:  Negative for rash.  Neurological:  Positive for weakness. Negative for dizziness, sensory change, focal weakness and headaches.       Past Medical History:  Diagnosis Date   Abdominopelvic abscess (HCC) 09/29/2018   Abscess of appendix 09/22/2018   Acquired spondylolisthesis of lumbosacral region 03/20/2020   Acute cholecystitis 11/15/2023   Acute kidney injury (HCC) 10/07/2018   Acute kidney injury superimposed on chronic kidney disease (HCC) 11/06/2020   Acute kidney injury superimposed on CKD (HCC) 02/18/2020   Acute on chronic systolic (congestive) heart failure (HCC) 07/26/2015    Formatting of this note might be different from the original. Formatting of this note might be different from the original. Formatting of this note might be different from the original. revenkar EF 35% Formatting of this note might be different from the original. revenkar EF 35%     Acute on chronic systolic CHF (congestive heart failure) (HCC) 08/12/2023   Anasarca 07/01/2023   Anemia of chronic disease 08/18/2019   Anemia, chronic disease 08/18/2019   Aortic atherosclerosis (HCC)     Aortic stenosis      a.) TTE 09/15/2018: mild-mod (MPG 19); b.) TTE 03/21/2021: mild-mod (MPG 14.3); c.) TTE 04/24/2022: mild- mod (MPG 15)   Aortic stenosis, moderate 10/15/2019   Appendicitis 09/08/2018   ARF (acute renal failure) (HCC) 02/19/2020   Asthma     Atherosclerosis of native arteries of the extremities with ulceration (HCC) 02/24/2018    Formatting of this note might be different from the original.  Last Assessment & Plan:   His ABIs today are 1.07 on the right and 0.99 on the left with biphasic waveforms.  Although these pressures may be somewhat elevated from medial calcification, his flow does appear to be reasonably good.  His left ABI was 0.58 prior to  intervention.  At this point, we will stretch out his follow-up and see hi   Benign hypertension with CKD (chronic kidney disease) stage IV (HCC) 02/24/2018   Benign prostatic hyperplasia without lower urinary tract symptoms 01/14/2018   Bilateral lower extremity edema 11/04/2018   Bradycardia 03/25/2018   Bruit of right carotid artery 07/26/2015   CAD (coronary artery disease) 2003    a.) s/p 4v CABG 2003   Cardiac murmur 07/26/2015   Choledocholithiasis 11/12/2023   Chronic combined systolic and diastolic CHF (congestive heart failure) (HCC) 07/26/2015    a.) TTE 09/15/2018: EF 50-55%, mod LVH, RVE, BAE, mild-mod TR, AoV sclerosis, G1DD; b.) MPI 10/27/2019: EF <30%; c.) TTE 03/21/2021: EF 35-40%, post AK, inf HK, mod LVH, mod red RVSF, mod LAE, mod Aov sclerosis, G2DD; c.) TTE 04/24/2022: EF 35-40%, post AK, glok HK, mod LVE, mod red RVSF, mild-mod MR, Aov sclerosis, G3DD   Chronic idiopathic constipation 11/02/2018   Chronic low back pain without sciatica 12/02/2018   Chronic pain of right hip 03/31/2020   Chronic systolic heart failure (HCC) 07/26/2015    Formatting of this note might be different from the original. revenkar EF 35%     CKD (chronic kidney disease) stage 4, GFR 15-29 ml/min (HCC) 10/07/2018   Congestive heart failure (HCC) 07/26/2015    Last Assessment & Plan:   Poor cardiac function could certainly be contributing to poor blood supply to the feet and toes as well.  Formatting of this note might be different from the original.  Formatting of this note might be different from the original.  Last Assessment & Plan:   Poor cardiac function could certainly be contributing to poor blood supply to the feet and toes as well.     Last   Coronary artery disease     Diabetes mellitus without complication (HCC)     Dysphagia 07/26/2015   Encounter for long-term (current) use of insulin  (HCC) 09/27/2020   Erectile dysfunction 07/14/2017   ESRD on dialysis [facility]) 11/15/2023    Essential hypertension 02/24/2018   Fever 11/07/2020   Foot ulcer (HCC) 09/27/2020    left foot  Formatting of this note might be different from the original.  Formatting of this note might be different from the original.  left foot     Foot ulcer, left (HCC)     GERD (gastroesophageal reflux disease)     Heart murmur 07/26/2015   Hematuria 07/27/2015   High risk medication use 07/26/2015   History of marijuana use     Hyperkalemia 05/29/2020   Hyperlipidemia 07/26/2015   Hyperlipidemia associated with type 2 diabetes mellitus (HCC) 02/18/2020   Hypertension     Hypertension associated with diabetes (HCC)     Hypoglycemia 05/07/2019   Hypothyroidism 11/14/2015   Hypothyroidism (acquired) 11/14/2015   Insomnia 11/02/2018   Kidney insufficiency     Lumbar spondylosis 03/20/2020   Malaise and fatigue 03/25/2018   Malnutrition of moderate degree 08/20/2023   Mitral regurgitation 05/12/2019   Moderate aortic regurgitation 05/12/2019   Myocardial infarction due to demand ischemia (HCC) 09/26/2020    a.) Type II NSTEMI; b.) troponins were trended 0.54 --> 0.56 --> 0.52 ng/mL   Non-compliance 05/05/2019   NSTEMI (non-ST elevated myocardial infarction) (HCC) 09/27/2020   Osseous and subluxation stenosis of intervertebral foramina of lumbar region 03/20/2020   Osteoarthritis 10/06/2019   Pancytopenia (HCC) 08/12/2023   Peripheral vascular disease (HCC)     Personal history of tobacco use, presenting hazards to health 09/27/2020   Polyneuropathy in diabetes (HCC) 05/07/2019   Primary osteoarthritis involving multiple joints 10/06/2019   Primary osteoarthritis of right hip 02/21/2020   Pulmonary HTN (HCC)      a.) TTE 05/12/2019: PASP 71; b.) TTE 09/27/2020: PASP >70; c.) TTE 03/21/2021: RVSP 43; d.) TTE 04/24/2022: RVSP 80.6   Puncture wound of right hip 04/02/2018   PVD (peripheral vascular disease) with claudication (HCC)      a.) s/p PTA 03/02/2018 - balloon angioplasty LEFT below  knee popliteal artery; b.) s/p PTA 09/30/2022: baloon angioplasty LEFT tibioperoneal trunck, most proximal peroneal artery, and LEFT popliteal artery.   Regional wall motion abnormality of heart 05/07/2019   Right groin pain 02/18/2020   S/P CABG x 4 2003   S/P TAVR (transcatheter aortic valve replacement) 01/20/2024    TAVR with a 29 mm Edwards Sapien 3 Ultra Resilia THV via the TF by Dr. Wendel and Dr. Shyrl   Screening for colon cancer 02/18/2018   Sepsis (HCC) 11/15/2023   Sleep apnea 07/26/2015    a.) does not require nocturnal PAP therapy   Subacute osteomyelitis of left foot (HCC) 03/25/2018   Thrombocytopenia (HCC)     Type 2 diabetes mellitus with hyperlipidemia (HCC) 07/26/2015   Type 2 diabetes mellitus with stage 4 chronic kidney disease, with long-term current use of insulin  (HCC) 07/26/2015   Type II diabetes mellitus with neurological manifestations (HCC) 02/18/2020   Vitamin B12 deficiency 06/16/2019   Vitamin  D deficiency 05/07/2018             Past Surgical History:  Procedure Laterality Date   AMPUTATION Left 01/14/2024    Procedure: AMPUTATION, FOOT, PARTIAL;  Surgeon: Serene Gaile ORN, MD;  Location: [facility] OR;  Service: Vascular;  Laterality: Left;   AMPUTATION TOE Left 03/13/2018    Procedure: AMPUTATION TOE-MPJ;  Surgeon: Ashley Soulier, DPM;  Location: [facility] ORS;  Service: Podiatry;  Laterality: Left;   ANGIOPLASTY       AV FISTULA PLACEMENT Left 08/20/2023    Procedure: ARTERIOVENOUS (AV) FISTULA CREATION;  Surgeon: Gretta Lonni PARAS, MD;  Location: [facility] OR;  Service: Vascular;  Laterality: Left;   CARDIAC CATHETERIZATION       CORONARY ARTERY BYPASS GRAFT N/A 2003   INTRAOPERATIVE TRANSTHORACIC ECHOCARDIOGRAM N/A 01/20/2024    Procedure: ECHOCARDIOGRAM, TRANSTHORACIC;  Surgeon: Wendel Lurena POUR, MD;  Location: [facility] INVASIVE CV LAB;  Service: Cardiovascular;  Laterality: N/A;   IR CATHETER TUBE CHANGE   12/31/2023   IR FLUORO GUIDE CV LINE RIGHT   08/18/2023    IR PERC CHOLECYSTOSTOMY   11/12/2023   IR RADIOLOGIST EVAL & MGMT   12/31/2023   IR US  GUIDE VASC ACCESS RIGHT   08/18/2023   LAPAROSCOPIC APPENDECTOMY N/A 09/08/2018    Procedure: APPENDECTOMY LAPAROSCOPIC;  Surgeon: Jordis Laneta FALCON, MD;  Location: [facility] ORS;  Service: General;  Laterality: N/A;   LOWER EXTREMITY ANGIOGRAPHY Left 03/02/2018    Procedure: LOWER EXTREMITY ANGIOGRAPHY;  Surgeon: Marea Selinda RAMAN, MD;  Location: [facility] INVASIVE CV LAB;  Service: Cardiovascular;  Laterality: Left;   LOWER EXTREMITY ANGIOGRAPHY Left 09/30/2022    Procedure: Lower Extremity Angiography;  Surgeon: Marea Selinda RAMAN, MD;  Location: [facility] INVASIVE CV LAB;  Service: Cardiovascular;  Laterality: Left;   LOWER EXTREMITY ANGIOGRAPHY Left 12/26/2023    Procedure: Lower Extremity Angiography;  Surgeon: Marea Selinda RAMAN, MD;  Location: [facility] INVASIVE CV LAB;  Service: Cardiovascular;  Laterality: Left;   RIGHT/LEFT HEART CATH AND CORONARY/GRAFT ANGIOGRAPHY N/A 08/01/2023    Procedure: RIGHT/LEFT HEART CATH AND CORONARY/GRAFT ANGIOGRAPHY;  Surgeon: Wendel Lurena POUR, MD;  Location: [facility] INVASIVE CV LAB;  Service: Cardiovascular;  Laterality: N/A;   ROTATOR CUFF REPAIR Left     TRANSMETATARSAL AMPUTATION Left 01/16/2024    Procedure: AMPUTATION, FOOT, TRANSMETATARSAL;  Surgeon: Serene Gaile ORN, MD;  Location: [facility] OR;  Service: Vascular;  Laterality: Left;             Family History  Problem Relation Age of Onset   Hyperlipidemia Mother     Hypertension Mother     Diabetes Mother     Heart attack Brother     Heart disease Brother     Lung disease Father          Social History:  reports that he quit smoking about 22 years ago. His smoking use included cigarettes. He started smoking about 47 years ago. He has a 12.5 pack-year smoking history. He has never used smokeless tobacco. He reports that he does not currently use alcohol. He reports that he does not use drugs. Allergies:  Allergies  No Known Allergies             Facility-Administered Medications Prior to Admission  Medication Dose Route Frequency Provider Last Rate Last Admin   sodium chloride  flush (NS) 0.9 % injection 10 mL  10 mL Intracatheter Q3 days PRN  Medications Prior to Admission  Medication Sig Dispense Refill   acetaminophen  (TYLENOL ) 500 MG tablet Take 1,000 mg by mouth every 6 (six) hours as needed for moderate pain or headache.        aspirin  EC 81 MG tablet Take 1 tablet (81 mg total) by mouth daily. Swallow whole.       atorvastatin  (LIPITOR) 20 MG tablet Take 1 tablet (20 mg total) by mouth daily. 90 tablet 3   carvedilol  (COREG ) 3.125 MG tablet Take 1 tablet (3.125 mg total) by mouth 2 (two) times daily. 180 tablet 3   clopidogrel  (PLAVIX ) 75 MG tablet Take 1 tablet (75 mg total) by mouth daily. 30 tablet 11   gabapentin  (NEURONTIN ) 300 MG capsule Take 1 capsule (300 mg total) by mouth at bedtime. (Patient taking differently: Take 600 mg by mouth 2 (two) times daily.) 30 capsule 0   insulin  glargine (LANTUS ) 100 UNIT/ML injection Inject 7 Units into the skin daily as needed (Diabetes). Inject 7 units at bedtime as needed after checking glucose       isosorbide  mononitrate (IMDUR ) 120 MG 24 hr tablet Take 1 tablet (120 mg total) by mouth daily. 90 tablet 3   latanoprost  (XALATAN ) 0.005 % ophthalmic solution Place 1 drop into both eyes at bedtime.       levothyroxine  (SYNTHROID ) 50 MCG tablet Take 50 mcg by mouth daily.       Methoxy PEG-Epoetin Beta (MIRCERA IJ) 30 mcg.       nitroGLYCERIN  (NITROSTAT ) 0.4 MG SL tablet Place 1 tablet (0.4 mg total) under the tongue every 5 (five) minutes as needed for chest pain. 25 tablet 6   sevelamer  carbonate (RENVELA ) 800 MG tablet Take 800 mg by mouth 3 (three) times daily with meals.       tamsulosin  (FLOMAX ) 0.4 MG CAPS capsule Take 0.4 mg by mouth daily after breakfast.       traMADol  (ULTRAM ) 50 MG tablet Take 50 mg by mouth every 8 (eight) hours as needed.       sodium  chloride flush (NS) 0.9 % SOLN Inject 5 mLs into the vein daily for 15 days. flush drain daily with 5 cc NS, record output daily, dressing changes every 2-3 days or earlier if soiled 75 mL 0   sodium chloride  flush 0.9 % SOLN injection 10 mLs by Intracatheter route every 3 (three) days. 100 mL 2          Blood pressure 125/65, pulse 73, temperature 97.7 F (36.5 C), temperature source Oral, resp. rate 19, height 6' (1.829 m), weight 75.1 kg, SpO2 100%. Physical Exam   General: No apparent distress HEENT: Head is normocephalic, atraumatic, sclera anicteric, oral mucosa a little dry Neck: Supple without JVD or lymphadenopathy Heart: Reg rate and rhythm Chest: CTA bilaterally without wheezes, rales, or rhonchi; no distress Abdomen: Soft, non-tender, non-distended, bowel sounds positive. + chole drain R abdomen Extremities: No clubbing, cyanosis, or edema. + TMA with Prevena vac in place L foot Psych: Pt's affect is appropriate. Pt is cooperative Skin: Clean and intact without signs of breakdown Neuro:     Mental Status: AAOx3 Speech/Languate:  fluent, follows simple commands CRANIAL NERVES: 2-12 grossly intact     MOTOR: RUE: 4/5 Deltoid, 4/5 Biceps, 4/5 Triceps,4/5 Grip LUE: 4/5 Deltoid, 4/5 Biceps, 4/5 Triceps, 4/5 Grip RLE: HF 4-/5, KE 4/5, ADF 4/5, APF 4/5 LLE: HF 4-/5, KE 4/5, able to move ankle up and down   SENSORY: Normal to touch all 4  extremities   Coordination: no ataxia or dysmetria noted       Lab Results Last 24 Hours       Results for orders placed or performed during the hospital encounter of 01/14/24 (from the past 24 hours)  POCT Activated clotting time     Status: None    Collection Time: 01/20/24  5:31 PM  Result Value Ref Range    Activated Clotting Time 273 seconds  I-STAT, chem 8     Status: Abnormal    Collection Time: 01/20/24  5:52 PM  Result Value Ref Range    Sodium 124 (L) 135 - 145 mmol/L    Potassium 4.2 3.5 - 5.1 mmol/L    Chloride 86  (L) 98 - 111 mmol/L    BUN 58 (H) 8 - 23 mg/dL    Creatinine, Ser 1.59 (H) 0.61 - 1.24 mg/dL    Glucose, Bld 883 (H) 70 - 99 mg/dL    Calcium , Ion 1.02 (L) 1.15 - 1.40 mmol/L    TCO2 22 22 - 32 mmol/L    Hemoglobin 11.6 (L) 13.0 - 17.0 g/dL    HCT 65.9 (L) 60.9 - 52.0 %  Glucose, capillary     Status: Abnormal    Collection Time: 01/20/24  7:02 PM  Result Value Ref Range    Glucose-Capillary 101 (H) 70 - 99 mg/dL  Glucose, capillary     Status: Abnormal    Collection Time: 01/20/24  9:14 PM  Result Value Ref Range    Glucose-Capillary 102 (H) 70 - 99 mg/dL    Comment 1 Notify RN      Comment 2 Document in Chart    Basic metabolic panel with GFR     Status: Abnormal    Collection Time: 01/21/24  3:31 AM  Result Value Ref Range    Sodium 131 (L) 135 - 145 mmol/L    Potassium 4.8 3.5 - 5.1 mmol/L    Chloride 92 (L) 98 - 111 mmol/L    CO2 22 22 - 32 mmol/L    Glucose, Bld 109 (H) 70 - 99 mg/dL    BUN 72 (H) 8 - 23 mg/dL    Creatinine, Ser 89.25 (H) 0.61 - 1.24 mg/dL    Calcium  8.5 (L) 8.9 - 10.3 mg/dL    GFR, Estimated 5 (L) >60 mL/min    Anion gap 17 (H) 5 - 15  CBC     Status: Abnormal    Collection Time: 01/21/24  3:31 AM  Result Value Ref Range    WBC 11.0 (H) 4.0 - 10.5 K/uL    RBC 3.69 (L) 4.22 - 5.81 MIL/uL    Hemoglobin 11.2 (L) 13.0 - 17.0 g/dL    HCT 64.9 (L) 60.9 - 52.0 %    MCV 94.9 80.0 - 100.0 fL    MCH 30.4 26.0 - 34.0 pg    MCHC 32.0 30.0 - 36.0 g/dL    RDW 85.0 88.4 - 84.4 %    Platelets 197 150 - 400 K/uL    nRBC 0.0 0.0 - 0.2 %  Magnesium      Status: None    Collection Time: 01/21/24  3:31 AM  Result Value Ref Range    Magnesium  2.2 1.7 - 2.4 mg/dL  Glucose, capillary     Status: Abnormal    Collection Time: 01/21/24  6:14 AM  Result Value Ref Range    Glucose-Capillary 109 (H) 70 - 99 mg/dL    Comment 1 Notify RN  Comment 2 Document in Chart    Glucose, capillary     Status: Abnormal    Collection Time: 01/21/24 11:42 AM  Result Value Ref  Range    Glucose-Capillary 121 (H) 70 - 99 mg/dL       Imaging Results (Last 48 hours)  ECHOCARDIOGRAM COMPLETE Result Date: 01/21/2024    ECHOCARDIOGRAM REPORT   Patient Name:   Phillip [name] Date of Exam: 01/21/2024 Medical Rec #:  [id number]        Height:       72.0 in Accession #:    [id number]       Weight:       165.6 lb Date of Birth:  1956-04-04       BSA:          1.966 m Patient Age:    67 years         BP:           128/75 mmHg Patient Gender: M                HR:           77 bpm. Exam Location:  Inpatient Procedure: 2D Echo, Cardiac Doppler and Color Doppler (Both Spectral and Color            Flow Doppler were utilized during procedure). Indications:    Post TAVR evaluation. ; I35.0 Nonrheumatic aortic (valve)                 stenosis  History:        Patient has prior history of Echocardiogram examinations, most                 recent 01/20/2024. CHF, CAD, Prior CABG, Pulmonary HTN, Aortic                 Valve Disease, Signs/Symptoms:Edema; Risk Factors:Hypertension                 and Dyslipidemia. ESRD.                 Aortic Valve: 29 mm Edwards Sapien prosthetic, stented (TAVR)                 valve is present in the aortic position. Procedure Date:                 01/20/2024.  Sonographer:    Ellouise Mose RDCS Referring Phys: [id number] [facility]  Sonographer Comments: Technically difficult study due to poor echo windows. IMPRESSIONS  1. Left ventricular ejection fraction, by estimation, is 30 to 35%. The left ventricle has moderately decreased function. The left ventricle demonstrates regional wall motion abnormalities (see scoring diagram/findings for description). The left ventricular internal cavity size was moderately dilated. There is mild concentric left ventricular hypertrophy. Left ventricular diastolic parameters are consistent with Grade I diastolic dysfunction (impaired relaxation).  2. The mitral valve is degenerative. Mild to moderate mitral valve regurgitation. No  evidence of mitral stenosis. Moderate mitral annular calcification.  3. The aortic valve has been repaired/replaced. There is a 29 mm Edwards Sapien prosthetic (TAVR) valve present in the aortic position. Procedure Date: 01/20/2024. Echo findings are consistent with trivial perivalvular leak of the aortic prosthesis.  4. Right ventricular systolic function is moderately reduced. The right ventricular size is normal.  5. Left atrial size was mildly dilated. FINDINGS  Left Ventricle: Left ventricular ejection fraction, by estimation, is 30 to 35%. The left ventricle has moderately  decreased function. The left ventricle demonstrates regional wall motion abnormalities. The left ventricular internal cavity size was moderately dilated. There is mild concentric left ventricular hypertrophy. Left ventricular diastolic parameters are consistent with Grade I diastolic dysfunction (impaired relaxation).  LV Wall Scoring: The inferior wall and posterior wall are akinetic. Right Ventricle: The right ventricular size is normal. No increase in right ventricular wall thickness. Right ventricular systolic function is moderately reduced. Left Atrium: Left atrial size was mildly dilated. Right Atrium: Right atrial size was normal in size. Pericardium: There is no evidence of pericardial effusion. Mitral Valve: The mitral valve is degenerative in appearance. Moderate mitral annular calcification. Mild to moderate mitral valve regurgitation. No evidence of mitral valve stenosis. MV peak gradient, 7.8 mmHg. The mean mitral valve gradient is 3.0 mmHg. Tricuspid Valve: The tricuspid valve is normal in structure. Tricuspid valve regurgitation is trivial. No evidence of tricuspid stenosis. Aortic Valve: Trivial perivalvular leak and 2 o'clock. The aortic valve has been repaired/replaced. Aortic valve regurgitation is not visualized. No aortic stenosis is present. Aortic valve mean gradient measures 5.0 mmHg. Aortic valve peak gradient  measures 10.2 mmHg. Aortic valve area, by VTI measures 4.55 cm. There is a 29 mm Edwards Sapien prosthetic, stented (TAVR) valve present in the aortic position. Procedure Date: 01/20/2024. Echo findings are consistent with normal structure and function of the aortic valve prosthesis. Pulmonic Valve: The pulmonic valve was normal in structure. Pulmonic valve regurgitation is not visualized. No evidence of pulmonic stenosis. Aorta: The aortic root and ascending aorta are structurally normal, with no evidence of dilitation. IAS/Shunts: There is redundancy of the interatrial septum. No atrial level shunt detected by color flow Doppler.  LEFT VENTRICLE PLAX 2D LVIDd:         5.18 cm      Diastology LVIDs:         4.80 cm      LV e' medial:    3.92 cm/s LV PW:         1.28 cm      LV E/e' medial:  22.8 LV IVS:        1.39 cm      LV e' lateral:   5.55 cm/s LVOT diam:     2.90 cm      LV E/e' lateral: 16.1 LV SV:         124 LV SV Index:   63 LVOT Area:     6.61 cm  LV Volumes (MOD) LV vol d, MOD A2C: 277.0 ml LV vol d, MOD A4C: 198.0 ml LV vol s, MOD A2C: 206.0 ml LV vol s, MOD A4C: 138.0 ml LV SV MOD A2C:     71.0 ml LV SV MOD A4C:     198.0 ml LV SV MOD BP:      71.8 ml RIGHT VENTRICLE            IVC RV S prime:     7.40 cm/s  IVC diam: 1.70 cm TAPSE (M-mode): 0.7 cm LEFT ATRIUM             Index        RIGHT ATRIUM           Index LA diam:        3.96 cm 2.01 cm/m   RA Area:     16.20 cm LA Vol (A2C):   38.2 ml 19.43 ml/m  RA Volume:   46.30 ml  23.55 ml/m LA Vol (A4C):   28.2 ml  14.34 ml/m LA Biplane Vol: 32.4 ml 16.48 ml/m  AORTIC VALVE AV Area (Vmax):    5.08 cm AV Area (Vmean):   4.73 cm AV Area (VTI):     4.55 cm AV Vmax:           160.00 cm/s AV Vmean:          100.467 cm/s AV VTI:            0.273 m AV Peak Grad:      10.2 mmHg AV Mean Grad:      5.0 mmHg LVOT Vmax:         123.00 cm/s LVOT Vmean:        71.900 cm/s LVOT VTI:          0.188 m LVOT/AV VTI ratio: 0.69  AORTA Ao Root diam: 3.51 cm Ao Asc  diam:  3.59 cm MITRAL VALVE MV Area (PHT): 4.15 cm     SHUNTS MV Area VTI:   3.42 cm     Systemic VTI:  0.19 m MV Peak grad:  7.8 mmHg     Systemic Diam: 2.90 cm MV Mean grad:  3.0 mmHg MV Vmax:       1.40 m/s MV Vmean:      88.5 cm/s MV Decel Time: 183 msec MV E velocity: 89.40 cm/s MV A velocity: 127.00 cm/s MV E/A ratio:  0.70 Toribio Fuel MD Electronically signed by Toribio Fuel MD Signature Date/Time: 01/21/2024/12:14:45 PM    Final     ECHOCARDIOGRAM LIMITED Result Date: 01/20/2024    ECHOCARDIOGRAM LIMITED REPORT   Patient Name:   Phillip [name] Date of Exam: 01/20/2024 Medical Rec #:  [id number]        Height:       72.0 in Accession #:    [id number]       Weight:       165.8 lb Date of Birth:  1956-04-24       BSA:          1.967 m Patient Age:    67 years         BP:           105/60 mmHg Patient Gender: M                HR:           61 bpm. Exam Location:  Inpatient Procedure: Limited Color Doppler (Both Spectral and Color Flow Doppler were            utilized during procedure). Indications:     aortic stenosis. TAVR  History:         Patient has prior history of Echocardiogram examinations, most                  recent 08/01/2023. CAD, Prior CABG, end stage renal disease;                  Risk Factors:Hypertension, Dyslipidemia, Diabetes and Sleep                  Apnea.                  Aortic Valve: 29 mm valve is present in the aortic position.                  Procedure Date: 01/20/2024.  Sonographer:     Tinnie Barefoot RDCS Referring Phys:  [id number] LAMARR SAUNDERS THOMPSON Diagnosing Phys: Darryle Decent MD IMPRESSIONS  1. Echo guided TAVR. Vmax 1.21 m/s, MG 3.0 mmHG, EOA 4.57 cm2. No regurgitation or paravalvular leak. Aortic valve regurgitation is not visualized. There is a 29 mm valve present in the aortic position. Procedure Date: 01/20/2024. Aortic valve area, by VTI measures 4.57 cm. Aortic valve mean gradient measures 3.0 mmHg. Aortic valve Vmax measures 1.21 m/s.  2. Left ventricular  ejection fraction, by estimation, is 30 to 35%. The left ventricle has moderately decreased function.  3. Right ventricular systolic function is normal. The right ventricular size is normal.  4. The mitral valve is degenerative. Mild mitral valve regurgitation. No evidence of mitral stenosis. FINDINGS  Left Ventricle: Left ventricular ejection fraction, by estimation, is 30 to 35%. The left ventricle has moderately decreased function.  LV Wall Scoring: The inferior wall and posterior wall are akinetic. Right Ventricle: The right ventricular size is normal. No increase in right ventricular wall thickness. Right ventricular systolic function is normal. Pericardium: There is no evidence of pericardial effusion. Mitral Valve: The mitral valve is degenerative in appearance. Mild mitral valve regurgitation. No evidence of mitral valve stenosis. Tricuspid Valve: The tricuspid valve is grossly normal. Tricuspid valve regurgitation is mild . No evidence of tricuspid stenosis. Aortic Valve: Echo guided TAVR. Vmax 1.21 m/s, MG 3.0 mmHG, EOA 4.57 cm2. No regurgitation or paravalvular leak. Aortic valve regurgitation is not visualized. Aortic regurgitation PHT measures 446 msec. Aortic valve mean gradient measures 3.0 mmHg. Aortic valve peak gradient measures 5.9 mmHg. Aortic valve area, by VTI measures 4.57 cm. There is a 29 mm valve present in the aortic position. Procedure Date: 01/20/2024. Aorta: The aortic root and ascending aorta are structurally normal, with no evidence of dilitation. Additional Comments: Spectral Doppler performed. Color Doppler performed.  LEFT VENTRICLE PLAX 2D LVIDd:         5.30 cm LV PW:         1.00 cm LV IVS:        1.20 cm LVOT diam:     2.68 cm LV SV:         105 LV SV Index:   54 LVOT Area:     5.64 cm  LV Volumes (MOD) LV vol d, MOD A4C: 181.0 ml LV vol s, MOD A4C: 117.0 ml LV SV MOD A4C:     181.0 ml LEFT ATRIUM         Index LA diam:    3.70 cm 1.88 cm/m  AORTIC VALVE AV Area (Vmax):     4.90 cm AV Area (Vmean):   4.80 cm AV Area (VTI):     4.57 cm AV Vmax:           121.00 cm/s AV Vmean:          80.400 cm/s AV VTI:            0.231 m AV Peak Grad:      5.9 mmHg AV Mean Grad:      3.0 mmHg LVOT Vmax:         105.00 cm/s LVOT Vmean:        68.400 cm/s LVOT VTI:          0.187 m LVOT/AV VTI ratio: 0.81 AI PHT:            446 msec  AORTA Ao Root diam: 3.40 cm Ao Asc diam:  3.30 cm  SHUNTS Systemic VTI:  0.19 m Systemic Diam: 2.68 cm Darryle Decent MD Electronically signed by Darryle Decent MD Signature Date/Time: 01/20/2024/6:03:09  PM    Final     Structural Heart Procedure Result Date: 01/20/2024 See surgical note for result.      Assessment/Plan: Diagnosis: Transmetatarsal amputation L foot and TAVR Does the need for close, 24 hr/day medical supervision in concert with the patient's rehab needs make it unreasonable for this patient to be served in a less intensive setting? Yes Co-Morbidities requiring supervision/potential complications:  - Severe aortic stenosis, HFrEF, LBBB, ESRD on HD, hypertension, cholecystitis requiring drain, anemia, CAD s/p CABG, diabetes mellitus type 2, hypothyroidism Due to bladder management, bowel management, safety, skin/wound care, disease management, medication administration, pain management, and patient education, does the patient require 24 hr/day rehab nursing? Yes Does the patient require coordinated care of a physician, rehab nurse, therapy disciplines of Pt/OT to address physical and functional deficits in the context of the above medical diagnosis(es)? Yes Addressing deficits in the following areas: balance, endurance, locomotion, strength, transferring, bowel/bladder control, bathing, dressing, feeding, grooming, toileting, and psychosocial support Can the patient actively participate in an intensive therapy program of at least 3 hrs of therapy per day at least 5 days per week? Yes The potential for patient to make measurable gains while on  inpatient rehab is excellent Anticipated functional outcomes upon discharge from inpatient rehab are modified independent and supervision  with PT, modified independent and supervision with OT, n/a with SLP. Estimated rehab length of stay to reach the above functional goals is: 7 days Anticipated discharge destination: Home Overall Rehab/Functional Prognosis: excellent   POST ACUTE RECOMMENDATIONS: This patient's condition is appropriate for continued rehabilitative care in the following setting: CIR Patient has agreed to participate in recommended program. Yes Note that insurance prior authorization may be required for reimbursement for recommended care.   Comment: Patient is a good candidate for CIR, Rehab coordinator to follow up.      I have personally performed a face to face diagnostic evaluation of this patient. Additionally, I have examined the patient's medical record including any pertinent labs and radiographic images.     Thanks,   Murray Collier, MD 01/21/2024       Revision History  Routing History

## 2024-02-05 NOTE — Progress Notes (Signed)
 Referring Provider(s): Dr. Dreama Hanger  Supervising Physician: Jennefer Rover  Patient Status:  [facility] - In-pt  Chief Complaint: Acute cholecystitis s/p percutaneous cholecystostomy drain exchange on 9/2 seen for drain follow up   Brief History:  68 year old male with a history of ESRD on HD, complicated cardiac history, and GERD who presented to the ED in June with complaints of acute abdominal pain after being found febrile at dialysis. Due to significant cardiac history and ESRD, patient was not deemed a surgical candidate and was referred to IR for perc chole placement which he underwent on 6/11. He followed up with Surgery on 8/11 who recommended no consideration for cholecystectomy until after his TAVR. Unfortunately, he ended up being admitted to the hospital on 8/13 following amputation of his left great toe. During his recovery, he was noted to have purulent drainage from the insertion site of his chole drain. The drain was subsequently exchanged on 9/2 in IR by Dr. JONETTA Jennefer.   Subjective:  Patient sitting upright in bedside chair. Reports he is doing well. Denies any pain at drain site, fevers/chills, or other symptoms at this time. All questions answered at the bedside.  Allergies: Patient has no known allergies.  Medications: Prior to Admission medications   Medication Sig Start Date End Date Taking? Authorizing Provider  acetaminophen  (TYLENOL ) 500 MG tablet Take 1,000 mg by mouth every 6 (six) hours as needed for moderate pain or headache.    Yes [provider]  aspirin  EC 81 MG tablet Take 1 tablet (81 mg total) by mouth daily. Swallow whole. 07/25/23  Yes Thukkani, Arun K, MD  atorvastatin  (LIPITOR) 20 MG tablet Take 1 tablet (20 mg total) by mouth daily. 07/25/23  Yes Thukkani, Arun K, MD  carvedilol  (COREG ) 3.125 MG tablet Take 1 tablet (3.125 mg total) by mouth 2 (two) times daily. 05/20/23 05/02/24 Yes Carlin Delon BROCKS, NP  clopidogrel  (PLAVIX ) 75 MG tablet  Take 1 tablet (75 mg total) by mouth daily. 12/26/23  Yes Dew, Selinda RAMAN, MD  gabapentin  (NEURONTIN ) 300 MG capsule Take 1 capsule (300 mg total) by mouth at bedtime. Patient taking differently: Take 600 mg by mouth 2 (two) times daily. 08/25/23  Yes Arrien, Mauricio Daniel, MD  isosorbide  mononitrate (IMDUR ) 120 MG 24 hr tablet Take 1 tablet (120 mg total) by mouth daily. 08/27/23  Yes Revankar, Jennifer SAUNDERS, MD  latanoprost  (XALATAN ) 0.005 % ophthalmic solution Place 1 drop into both eyes at bedtime. 04/15/23  Yes [provider]  levothyroxine  (SYNTHROID ) 50 MCG tablet Take 50 mcg by mouth daily. 04/05/23  Yes [provider]  Methoxy PEG-Epoetin Beta (MIRCERA IJ) 30 mcg. 12/16/23 12/14/24 Yes [provider]  nitroGLYCERIN  (NITROSTAT ) 0.4 MG SL tablet Place 1 tablet (0.4 mg total) under the tongue every 5 (five) minutes as needed for chest pain. 11/20/20  Yes Revankar, Jennifer SAUNDERS, MD  sevelamer  carbonate (RENVELA ) 800 MG tablet Take 800 mg by mouth 3 (three) times daily with meals. 09/04/23 11/29/24 Yes [provider]  tamsulosin  (FLOMAX ) 0.4 MG CAPS capsule Take 0.4 mg by mouth daily after breakfast. 04/19/20  Yes [provider]  traMADol  (ULTRAM ) 50 MG tablet Take 50 mg by mouth every 8 (eight) hours as needed. 09/15/23  Yes [provider]  amoxicillin -clavulanate (AUGMENTIN ) 500-125 MG tablet Take 1 tablet by mouth 2 (two) times daily. 02/05/24   Sigdel, Derryl, MD  bisacodyl  (DULCOLAX) 5 MG EC tablet Take 1 tablet (5 mg total) by mouth daily as  needed for moderate constipation. 02/05/24   Sigdel, Santosh, MD  insulin  glargine (LANTUS ) 100 UNIT/ML injection Inject 0.07 mLs (7 Units total) into the skin at bedtime. Inject 7 units at bedtime as needed after checking glucose 02/05/24   Mcarthur Pick, MD  linezolid  (ZYVOX ) 600 MG tablet Take 1 tablet (600 mg total) by mouth every 12 (twelve) hours. 02/05/24   Sigdel, Santosh, MD  sodium chloride  flush (NS) 0.9 % SOLN  Inject 5 mLs into the vein daily for 15 days. flush drain daily with 5 cc NS, record output daily, dressing changes every 2-3 days or earlier if soiled 11/14/23 11/29/23  Darci Pore, MD  sodium chloride  flush 0.9 % SOLN injection 10 mLs by Intracatheter route every 3 (three) days. 01/02/24 04/01/24  Tonette Warren SAUNDERS, NP     Vital Signs: BP (!) 91/59 (BP Location: Right Arm)   Pulse 85   Temp 97.9 F (36.6 C) (Oral)   Resp 16   Ht 6' (1.829 m)   Wt 154 lb 15.7 oz (70.3 kg)   SpO2 100%   BMI 21.02 kg/m   Physical Exam Vitals reviewed.  Constitutional:      Appearance: Normal appearance.  Cardiovascular:     Rate and Rhythm: Normal rate.  Pulmonary:     Effort: Pulmonary effort is normal.  Abdominal:     General: Abdomen is flat.     Palpations: Abdomen is soft.     Tenderness: There is no abdominal tenderness.     Comments: RUQ drain sutured in place w/ large amount of pus coming from insertion site. Purulent material also noted to overlying bandage. Insertion site is non-erythematous and non-tender to palpation. Scant amount of bloody fluid in bulb to suction. Drain would not flush or aspirate.   Neurological:     Mental Status: He is alert.     Drain Location: RUQ Size: Fr size: 10 Fr Date of placement: 11/12/23 w/ exchange on 02/03/24  Currently to: Drain collection device: suction bulb 24 hour output:  Output by Drain (mL) 02/03/24 0701 - 02/03/24 1900 02/03/24 1901 - 02/04/24 0700 02/04/24 0701 - 02/04/24 1900 02/04/24 1901 - 02/05/24 0700 02/05/24 0701 - 02/05/24 1516  Closed System Drain 1 Right;Lateral Abdomen Other (Comment) 10 Fr.  20       Interval imaging/drain manipulation:  None  Current examination: Does not flush.  Insertion site unremarkable. Suture and stat lock in place. Dressed appropriately.    Labs:  CBC: Recent Labs    01/31/24 0341 02/01/24 0342 02/03/24 1630 02/05/24 0650  WBC 10.3 10.5 8.9 9.0  HGB 8.5* 8.8* 7.9* 9.7*  HCT  27.6* 27.9* 25.7* 31.3*  PLT 139* 151 144* 156    COAGS: Recent Labs    08/17/23 0228 11/11/23 1930 11/12/23 1011 01/19/24 1844  INR 1.4* 1.4* 1.3* 1.1  APTT  --   --   --  39*    BMP: Recent Labs    02/01/24 0342 02/02/24 0429 02/03/24 1630 02/05/24 0650  NA 132* 136 128* 134*  K 3.9 4.3 4.6 4.4  CL 92* 92* 89* 91*  CO2 28 28 22 28   GLUCOSE 119* 122* 137* 118*  BUN 26* 41* 62* 18  CALCIUM  8.2* 8.6* 8.1* 8.8*  CREATININE 4.80* 6.87* 9.08* 4.24*  GFRNONAA 12* 8* 6* 14*    LIVER FUNCTION TESTS: Recent Labs    11/13/23 0821 01/15/24 0338 01/19/24 1844 01/26/24 0328 02/03/24 1630  BILITOT 2.4* 0.8 0.8 0.8  --  AST 187* 19 22 17   --   ALT 97* 11 <5 <5  --   ALKPHOS 123 142* 161* 96  --   PROT 8.1 7.6 9.4* 8.4*  --   ALBUMIN  2.7* 2.1* 2.5* 2.4* 2.1*    Assessment and Plan:  Acute cholecystitis: Phillip [name] is a 68 y.o. male with a history of cardiac complications and acute cholecystis deemed to be a poor surgical candidate. Underwent initial drain placement on 6/11. He was admitted for an unrelated amputation of his left great toe when the team noted purulent drainage from his insertion site. He subsequently underwent cholecystostomy drain exchange on 9/2 with Dr. JONETTA Sides.   -Patient denies any pain at the area of the drain site -Unable to flush or aspirate the drain -Discussed with nurse who stated there were no orders to flush the drain and therefore drain has not been flushed since exchange on 9/2 -Nurse states she requested drain care order from the Hospitalist who refused to place order; informed her to reach out to IR in the future -Order placed for TID drain flushes as well as I/Os -Discussed with Dr. Sides who recommends repeat CT A/P for further evaluation  -Suspect that drain may need to be exchanged due to clogged catheter   While Inpatient: Continue TID flushes with 5 cc NS. Record output Q shift. Dressing changes QD or PRN if soiled.   Call IR APP or on call IR MD if difficulty flushing or sudden change in drain output.    Discharge planning: Please contact IR APP or on call IR MD prior to patient d/c to ensure appropriate follow up plans are in place. Typically patient will follow up with IR in 6-8 weeks for drain injection and exchange. IR scheduler will contact patient with date/time of appointment. Patient will need to flush drain QD with 5 cc NS, record output QD, dressing changes every 2-3 days or earlier if soiled.   IR will continue to follow - please call with questions or concerns.  Thank you for allowing our service to participate in ZAY [name] 's care.   Electronically Signed: Marialice Newkirk M Cardell Rachel, PA-C 02/05/2024, 3:00 PM    I spent a total of 25 Minutes at the the patient's bedside AND on the patient's hospital floor or unit, greater than 50% of which was counseling/coordinating care for percutaneous cholecystostomy drain follow up.

## 2024-02-06 DIAGNOSIS — G548 Other nerve root and plexus disorders: Secondary | ICD-10-CM

## 2024-02-06 DIAGNOSIS — Z89512 Acquired absence of left leg below knee: Secondary | ICD-10-CM | POA: Diagnosis not present

## 2024-02-06 DIAGNOSIS — I10 Essential (primary) hypertension: Secondary | ICD-10-CM | POA: Diagnosis not present

## 2024-02-06 DIAGNOSIS — D649 Anemia, unspecified: Secondary | ICD-10-CM

## 2024-02-06 DIAGNOSIS — R52 Pain, unspecified: Secondary | ICD-10-CM

## 2024-02-06 MED ORDER — AMOXICILLIN-POT CLAVULANATE 500-125 MG PO TABS
1.0000 | ORAL_TABLET | Freq: Every day | ORAL | Status: DC
  Administered 2024-02-06 – 2024-02-22 (×17): 1 via ORAL
  Filled 2024-02-06 (×18): qty 1

## 2024-02-06 MED ORDER — MELATONIN 3 MG PO TABS
3.0000 mg | ORAL_TABLET | Freq: Every evening | ORAL | Status: DC | PRN
  Administered 2024-02-07 – 2024-02-22 (×9): 3 mg via ORAL
  Filled 2024-02-06 (×9): qty 1

## 2024-02-06 MED ORDER — GABAPENTIN 100 MG PO CAPS: 100.0000 mg | ORAL_CAPSULE | Freq: Three times a day (TID) | ORAL | Status: DC

## 2024-02-06 MED ORDER — NEPRO/CARBSTEADY PO LIQD
237.0000 mL | Freq: Three times a day (TID) | ORAL | Status: DC
  Administered 2024-02-06 – 2024-02-12 (×12): 237 mL via ORAL

## 2024-02-06 MED ORDER — CHLORHEXIDINE GLUCONATE CLOTH 2 % EX PADS
6.0000 | MEDICATED_PAD | Freq: Every day | CUTANEOUS | Status: DC
  Administered 2024-02-07: 6 via TOPICAL

## 2024-02-06 MED ORDER — RENA-VITE PO TABS
1.0000 | ORAL_TABLET | Freq: Every day | ORAL | Status: DC
  Administered 2024-02-06 – 2024-02-11 (×6): 1 via ORAL
  Filled 2024-02-06 (×6): qty 1

## 2024-02-06 MED ORDER — GABAPENTIN 100 MG PO CAPS: 100.0000 mg | ORAL_CAPSULE | Freq: Once | ORAL | Status: DC

## 2024-02-06 MED ORDER — LATANOPROST 0.005 % OP SOLN
1.0000 [drp] | Freq: Every day | OPHTHALMIC | Status: DC
  Administered 2024-02-06 – 2024-02-22 (×17): 1 [drp] via OPHTHALMIC
  Filled 2024-02-06 (×3): qty 2.5

## 2024-02-06 MED ORDER — PREGABALIN 25 MG PO CAPS: 25.0000 mg | ORAL_CAPSULE | ORAL | Status: DC

## 2024-02-06 MED ORDER — JUVEN PO PACK
1.0000 | PACK | Freq: Two times a day (BID) | ORAL | Status: DC
  Administered 2024-02-07 – 2024-02-14 (×12): 1 via ORAL
  Filled 2024-02-06 (×15): qty 1

## 2024-02-06 MED ORDER — PREGABALIN 25 MG PO CAPS
25.0000 mg | ORAL_CAPSULE | Freq: Once | ORAL | Status: AC
  Administered 2024-02-06: 25 mg via ORAL
  Filled 2024-02-06: qty 1

## 2024-02-06 MED ORDER — GABAPENTIN 100 MG PO CAPS: 100.0000 mg | ORAL_CAPSULE | ORAL | Status: DC

## 2024-02-06 NOTE — Progress Notes (Signed)
 Inpatient Rehabilitation Care Coordinator Assessment and Plan Patient Details  Name: Phillip [name] MRN: [id number] Date of Birth: 06/18/1955  Today's Date: 02/06/2024  Hospital Problems: Principal Problem:   Left below-knee amputee [facility])  Past Medical History:  Past Medical History:  Diagnosis Date   Abdominopelvic abscess (HCC) 09/29/2018   Abscess of appendix 09/22/2018   Acquired spondylolisthesis of lumbosacral region 03/20/2020   Acute cholecystitis 11/15/2023   Acute kidney injury (HCC) 10/07/2018   Acute kidney injury superimposed on chronic kidney disease (HCC) 11/06/2020   Acute kidney injury superimposed on CKD (HCC) 02/18/2020   Acute on chronic systolic (congestive) heart failure (HCC) 07/26/2015   Formatting of this note might be different from the original. Formatting of this note might be different from the original. Formatting of this note might be different from the original. revenkar EF 35% Formatting of this note might be different from the original. revenkar EF 35%     Acute on chronic systolic CHF (congestive heart failure) (HCC) 08/12/2023   Anasarca 07/01/2023   Anemia of chronic disease 08/18/2019   Anemia, chronic disease 08/18/2019   Aortic atherosclerosis (HCC)    Aortic stenosis    a.) TTE 09/15/2018: mild-mod (MPG 19); b.) TTE 03/21/2021: mild-mod (MPG 14.3); c.) TTE 04/24/2022: mild- mod (MPG 15)   Aortic stenosis, moderate 10/15/2019   Appendicitis 09/08/2018   ARF (acute renal failure) (HCC) 02/19/2020   Asthma    Atherosclerosis of native arteries of the extremities with ulceration (HCC) 02/24/2018   Formatting of this note might be different from the original.  Last Assessment & Plan:   His ABIs today are 1.07 on the right and 0.99 on the left with biphasic waveforms.  Although these pressures may be somewhat elevated from medial calcification, his flow does appear to be reasonably good.  His left ABI was 0.58 prior to intervention.  At this  point, we will stretch out his follow-up and see hi   Benign hypertension with CKD (chronic kidney disease) stage IV (HCC) 02/24/2018   Benign prostatic hyperplasia without lower urinary tract symptoms 01/14/2018   Bilateral lower extremity edema 11/04/2018   Bradycardia 03/25/2018   Bruit of right carotid artery 07/26/2015   CAD (coronary artery disease) 2003   a.) s/p 4v CABG 2003   Cardiac murmur 07/26/2015   Choledocholithiasis 11/12/2023   Chronic combined systolic and diastolic CHF (congestive heart failure) (HCC) 07/26/2015   a.) TTE 09/15/2018: EF 50-55%, mod LVH, RVE, BAE, mild-mod TR, AoV sclerosis, G1DD; b.) MPI 10/27/2019: EF <30%; c.) TTE 03/21/2021: EF 35-40%, post AK, inf HK, mod LVH, mod red RVSF, mod LAE, mod Aov sclerosis, G2DD; c.) TTE 04/24/2022: EF 35-40%, post AK, glok HK, mod LVE, mod red RVSF, mild-mod MR, Aov sclerosis, G3DD   Chronic idiopathic constipation 11/02/2018   Chronic low back pain without sciatica 12/02/2018   Chronic pain of right hip 03/31/2020   Chronic systolic heart failure (HCC) 07/26/2015   Formatting of this note might be different from the original. revenkar EF 35%     CKD (chronic kidney disease) stage 4, GFR 15-29 ml/min (HCC) 10/07/2018   Congestive heart failure (HCC) 07/26/2015   Last Assessment & Plan:   Poor cardiac function could certainly be contributing to poor blood supply to the feet and toes as well.     Formatting of this note might be different from the original.  Formatting of this note might be different from the original.  Last Assessment & Plan:   Poor  cardiac function could certainly be contributing to poor blood supply to the feet and toes as well.     Last   Coronary artery disease    Diabetes mellitus without complication (HCC)    Dysphagia 07/26/2015   Encounter for long-term (current) use of insulin  (HCC) 09/27/2020   Erectile dysfunction 07/14/2017   ESRD on dialysis [facility]) 11/15/2023   Essential hypertension 02/24/2018    Fever 11/07/2020   Foot ulcer (HCC) 09/27/2020   left foot  Formatting of this note might be different from the original.  Formatting of this note might be different from the original.  left foot     Foot ulcer, left (HCC)    GERD (gastroesophageal reflux disease)    Heart murmur 07/26/2015   Hematuria 07/27/2015   High risk medication use 07/26/2015   History of marijuana use    Hyperkalemia 05/29/2020   Hyperlipidemia 07/26/2015   Hyperlipidemia associated with type 2 diabetes mellitus (HCC) 02/18/2020   Hypertension    Hypertension associated with diabetes (HCC)    Hypoglycemia 05/07/2019   Hypothyroidism 11/14/2015   Hypothyroidism (acquired) 11/14/2015   Insomnia 11/02/2018   Kidney insufficiency    Lumbar spondylosis 03/20/2020   Malaise and fatigue 03/25/2018   Malnutrition of moderate degree 08/20/2023   Mitral regurgitation 05/12/2019   Moderate aortic regurgitation 05/12/2019   Myocardial infarction due to demand ischemia (HCC) 09/26/2020   a.) Type II NSTEMI; b.) troponins were trended 0.54 --> 0.56 --> 0.52 ng/mL   Non-compliance 05/05/2019   NSTEMI (non-ST elevated myocardial infarction) (HCC) 09/27/2020   Osseous and subluxation stenosis of intervertebral foramina of lumbar region 03/20/2020   Osteoarthritis 10/06/2019   Pancytopenia (HCC) 08/12/2023   Peripheral vascular disease (HCC)    Personal history of tobacco use, presenting hazards to health 09/27/2020   Polyneuropathy in diabetes (HCC) 05/07/2019   Primary osteoarthritis involving multiple joints 10/06/2019   Primary osteoarthritis of right hip 02/21/2020   Pulmonary HTN (HCC)    a.) TTE 05/12/2019: PASP 71; b.) TTE 09/27/2020: PASP >70; c.) TTE 03/21/2021: RVSP 43; d.) TTE 04/24/2022: RVSP 80.6   Puncture wound of right hip 04/02/2018   PVD (peripheral vascular disease) with claudication (HCC)    a.) s/p PTA 03/02/2018 - balloon angioplasty LEFT below knee popliteal artery; b.) s/p PTA 09/30/2022:  baloon angioplasty LEFT tibioperoneal trunck, most proximal peroneal artery, and LEFT popliteal artery.   Regional wall motion abnormality of heart 05/07/2019   Right groin pain 02/18/2020   S/P CABG x 4 2003   S/P TAVR (transcatheter aortic valve replacement) 01/20/2024   TAVR with a 29 mm Edwards Sapien 3 Ultra Resilia THV via the TF by Dr. Wendel and Dr. Shyrl   Screening for colon cancer 02/18/2018   Sepsis (HCC) 11/15/2023   Sleep apnea 07/26/2015   a.) does not require nocturnal PAP therapy   Subacute osteomyelitis of left foot (HCC) 03/25/2018   Thrombocytopenia (HCC)    Type 2 diabetes mellitus with hyperlipidemia (HCC) 07/26/2015   Type 2 diabetes mellitus with stage 4 chronic kidney disease, with long-term current use of insulin  (HCC) 07/26/2015   Type II diabetes mellitus with neurological manifestations (HCC) 02/18/2020   Vitamin B12 deficiency 06/16/2019   Vitamin D deficiency 05/07/2018   Past Surgical History:  Past Surgical History:  Procedure Laterality Date   AMPUTATION Left 01/14/2024   Procedure: AMPUTATION, FOOT, PARTIAL;  Surgeon: Serene Gaile ORN, MD;  Location: [facility] OR;  Service: Vascular;  Laterality: Left;   AMPUTATION Left  01/30/2024   Procedure: AMPUTATION BELOW KNEE;  Surgeon: Serene Gaile ORN, MD;  Location[facility] OR;  Service: Vascular;  Laterality: Left;   AMPUTATION TOE Left 03/13/2018   Procedure: AMPUTATION TOE-MPJ;  Surgeon: Ashley Soulier, DPM;  Location: [facility] ORS;  Service: Podiatry;  Laterality: Left;   ANGIOPLASTY     AV FISTULA PLACEMENT Left 08/20/2023   Procedure: ARTERIOVENOUS (AV) FISTULA CREATION;  Surgeon: Gretta Lonni PARAS, MD;  Location: [facility] OR;  Service: Vascular;  Laterality: Left;   CARDIAC CATHETERIZATION     CORONARY ARTERY BYPASS GRAFT N/A 2003   INTRAOPERATIVE TRANSTHORACIC ECHOCARDIOGRAM N/A 01/20/2024   Procedure: ECHOCARDIOGRAM, TRANSTHORACIC;  Surgeon: Wendel Lurena POUR, MD;  Location: [facility] INVASIVE CV LAB;  Service:  Cardiovascular;  Laterality: N/A;   IR CATHETER TUBE CHANGE  12/31/2023   IR EXCHANGE BILIARY DRAIN  02/03/2024   IR FLUORO GUIDE CV LINE RIGHT  08/18/2023   IR PERC CHOLECYSTOSTOMY  11/12/2023   IR RADIOLOGIST EVAL & MGMT  12/31/2023   IR US  GUIDE VASC ACCESS RIGHT  08/18/2023   LAPAROSCOPIC APPENDECTOMY N/A 09/08/2018   Procedure: APPENDECTOMY LAPAROSCOPIC;  Surgeon: Jordis Laneta FALCON, MD;  Location: [facility] ORS;  Service: General;  Laterality: N/A;   LOWER EXTREMITY ANGIOGRAPHY Left 03/02/2018   Procedure: LOWER EXTREMITY ANGIOGRAPHY;  Surgeon: Marea Selinda RAMAN, MD;  Location: [facility] INVASIVE CV LAB;  Service: Cardiovascular;  Laterality: Left;   LOWER EXTREMITY ANGIOGRAPHY Left 09/30/2022   Procedure: Lower Extremity Angiography;  Surgeon: Marea Selinda RAMAN, MD;  Location: [facility] INVASIVE CV LAB;  Service: Cardiovascular;  Laterality: Left;   LOWER EXTREMITY ANGIOGRAPHY Left 12/26/2023   Procedure: Lower Extremity Angiography;  Surgeon: Marea Selinda RAMAN, MD;  Location: [facility] INVASIVE CV LAB;  Service: Cardiovascular;  Laterality: Left;   RIGHT/LEFT HEART CATH AND CORONARY/GRAFT ANGIOGRAPHY N/A 08/01/2023   Procedure: RIGHT/LEFT HEART CATH AND CORONARY/GRAFT ANGIOGRAPHY;  Surgeon: Wendel Lurena POUR, MD;  Location: [facility] INVASIVE CV LAB;  Service: Cardiovascular;  Laterality: N/A;   ROTATOR CUFF REPAIR Left    TRANSMETATARSAL AMPUTATION Left 01/16/2024   Procedure: AMPUTATION, FOOT, TRANSMETATARSAL;  Surgeon: Serene Gaile ORN, MD;  Location: [facility] OR;  Service: Vascular;  Laterality: Left;   Social History:  reports that he quit smoking about 22 years ago. His smoking use included cigarettes. He started smoking about 47 years ago. He has a 12.5 pack-year smoking history. He has never used smokeless tobacco. He reports that he does not currently use alcohol. He reports that he does not use drugs.  Family / Support Systems Marital Status: Married How Long?: 46 years Patient Roles: Spouse, Caregiver Spouse/Significant Other:  [name] Children: 1 living child. Other deceased Anticipated Caregiver: [name] Ability/Limitations of Caregiver: Wife is currently not working, cares for 69 yo special needs grandson Caregiver Availability: 24/7 Family Dynamics: Good family dynamics. Has a supportive spouse and a special needs grandson living in the home.  Social History Preferred language: English Religion: Christian Reformed Education: [employer] school Health Literacy - How often do you need to have someone help you when you read instructions, pamphlets, or other written material from your doctor or pharmacy?: Never Writes: Yes Employment Status: Retired Date Retired/Disabled/Unemployed: 28 years Marine scientist Issues: NA Guardian/Conservator: NA   Abuse/Neglect Abuse/Neglect Assessment Can Be Completed: Yes Physical Abuse: Denies Verbal Abuse: Denies Sexual Abuse: Denies Exploitation of patient/patient's resources: Denies Self-Neglect: Denies  Patient response to: Social Isolation - How often do you feel lonely or isolated from those around you?: Rarely  Emotional Status Pt's affect,  behavior and adjustment status: Patient is adjusting but is having a difficult time Recent Psychosocial Issues: None Psychiatric History: None Substance Abuse History: None  Patient / Family Perceptions, Expectations & Goals Pt/Family understanding of illness & functional limitations: Patient undrstanding of illness and functional limitations Premorbid pt/family roles/activities: Spouse, caregiver, parent Anticipated changes in roles/activities/participation: Maybe limited assistance with caregiving for grandson Pt/family expectations/goals: Patient plans to return to prior independence as a new BKA  Nurse, mental health referrals recommended: Neuropsychology  Discharge Planning Living Arrangements: Spouse/significant other, Other relatives Support Systems: Spouse/significant other Type of Residence:  Private residence Insurance Resources: Media planner (specify) ([employer] ADVANTAGE / [employer] ADVANTAGE PPO) Financial Resources:  (Retirement) Financial Screen Referred: No Living Expenses: Own Money Management: Patient, Spouse Does the patient have any problems obtaining your medications?: No Home Management: Patient, Spouse, [name] Patient/Family Preliminary Plans: Plans to return home with mid-high independence Care Coordinator Anticipated Follow Up Needs: HH/OP Expected length of stay: 7 days  Clinical Impression CSW met with patient/family to introduce herself and complete initial assessment. Patient is AxOx4 and able to make all needs known. Patient is a 68 y/o male who was admitted to [facility] after a BKA. He lives in the home with his wife of 46 years, [name] and she cares for their 69 y/o disabled grandson. There are 2 steps to enter the home. DME: [employer] and [employer] in the home. Patient would like to be as independent as possible upon discharge. Transportation will be provided by [place] upon discharge. There were no further needs or concerns at present. CSW will continue to follow.   Di'Asia  Loreli 02/06/2024, 4:24 PM

## 2024-02-06 NOTE — Progress Notes (Signed)
 Patient ID: Phillip [name], male   DOB: [birth year], 68 y.o.   MRN: [id number] Met with the patient and spouse/family to review current situation, rehab process, team conference and plan of care. Discussed secondary risk management, medications and diet. Encouraged patient to increase protein and offered suggestions for food choices; patient has a poor appetite. Dietician consult for wound healing. Continue to follow along to address educational needs to facilitate preparation for discharge. Phillip [name]

## 2024-02-06 NOTE — Plan of Care (Signed)
  Problem: Consults Goal: RH LIMB LOSS PATIENT EDUCATION Description: Description: See Patient Education module for eduction specifics. Outcome: Progressing   Problem: RH BOWEL ELIMINATION Goal: RH STG MANAGE BOWEL WITH ASSISTANCE Description: STG Manage Bowel with mod I Assistance. Outcome: Progressing   Problem: RH SKIN INTEGRITY Goal: RH STG SKIN FREE OF INFECTION/BREAKDOWN Outcome: Progressing   Problem: RH SAFETY Goal: RH STG ADHERE TO SAFETY PRECAUTIONS W/ASSISTANCE/DEVICE Description: STG Adhere to Safety Precautions With mod I Assistance/Device. Outcome: Progressing   Problem: RH PAIN MANAGEMENT Goal: RH STG PAIN MANAGED AT OR BELOW PT'S PAIN GOAL Description: <4 w/ prns Outcome: Progressing   Problem: RH KNOWLEDGE DEFICIT LIMB LOSS Goal: RH STG INCREASE KNOWLEDGE OF SELF CARE AFTER LIMB LOSS Description: Manage increase knowledge of self carte after limb loss with mod I assistance from wife using educational materials provided Outcome: Progressing

## 2024-02-06 NOTE — Progress Notes (Signed)
 Inpatient Rehabilitation Center Individual Statement of Services  Patient Name:  Phillip [name]  Date:  02/06/2024  Welcome to the Inpatient Rehabilitation Center.  Our goal is to provide you with an individualized program based on your diagnosis and situation, designed to meet your specific needs.  With this comprehensive rehabilitation program, you will be expected to participate in at least 3 hours of rehabilitation therapies Monday-Friday, with modified therapy programming on the weekends.  Your rehabilitation program will include the following services:  Physical Therapy (PT), Occupational Therapy (OT), Speech Therapy (ST), 24 hour per day rehabilitation nursing, Therapeutic Recreaction (TR), Psychology, Neuropsychology, Care Coordinator, Rehabilitation Medicine, Nutrition Services, and Pharmacy Services  Weekly team conferences will be held on Wednesday to discuss your progress.  Your Inpatient Rehabilitation Care Coordinator will talk with you frequently to get your input and to update you on team discussions.  Team conferences with you and your family in attendance may also be held.  Expected length of stay: 7-10 days Overall anticipated outcome: Supervision/Verbal cueing   Depending on your progress and recovery, your program may change. Your Inpatient Rehabilitation Care Coordinator will coordinate services and will keep you informed of any changes. Your Inpatient Rehabilitation Care Coordinator's name and contact numbers are listed  below.  The following services may also be recommended but are not provided by the Inpatient Rehabilitation Center:  Driving Evaluations Home Health Rehabiltiation Services Outpatient Rehabilitation Services Vocational Rehabilitation   Arrangements will be made to provide these services after discharge if needed.  Arrangements include referral to agencies that provide these services.  Your insurance has been verified to be:  HEALTHTEAM ADVANTAGE  Your  primary doctor is:  [name] LABOR., MD  Pertinent information will be shared with your doctor and your insurance company.  Inpatient Rehabilitation Care Coordinator:  Di'Asia Loreli SIERRAS [phone] or ELIGAH BRINKS  Information discussed with and copy given to patient by: Waverly Loreli, 02/06/2024, 4:27 PM

## 2024-02-06 NOTE — Plan of Care (Signed)
  Problem: RH Eating Goal: LTG Patient will perform eating w/assist, cues/equip (OT) Description: LTG: Patient will perform eating with assist, with/without cues using equipment (OT) Flowsheets (Taken 02/06/2024 1202) LTG: Pt will perform eating with assistance level of: Supervision/Verbal cueing   Problem: RH Grooming Goal: LTG Patient will perform grooming w/assist,cues/equip (OT) Description: LTG: Patient will perform grooming with assist, with/without cues using equipment (OT) Flowsheets (Taken 02/06/2024 1202) LTG: Pt will perform grooming with assistance level of: Supervision/Verbal cueing   Problem: RH Bathing Goal: LTG Patient will bathe all body parts with assist levels (OT) Description: LTG: Patient will bathe all body parts with assist levels (OT) Flowsheets (Taken 02/06/2024 1202) LTG: Pt will perform bathing with assistance level/cueing: Supervision/Verbal cueing   Problem: RH Dressing Goal: LTG Patient will perform upper body dressing (OT) Description: LTG Patient will perform upper body dressing with assist, with/without cues (OT). Flowsheets (Taken 02/06/2024 1202) LTG: Pt will perform upper body dressing with assistance level of: Supervision/Verbal cueing Goal: LTG Patient will perform lower body dressing w/assist (OT) Description: LTG: Patient will perform lower body dressing with assist, with/without cues in positioning using equipment (OT) Flowsheets (Taken 02/06/2024 1202) LTG: Pt will perform lower body dressing with assistance level of: Supervision/Verbal cueing   Problem: RH Toileting Goal: LTG Patient will perform toileting task (3/3 steps) with assistance level (OT) Description: LTG: Patient will perform toileting task (3/3 steps) with assistance level (OT)  Flowsheets (Taken 02/06/2024 1202) LTG: Pt will perform toileting task (3/3 steps) with assistance level: Supervision/Verbal cueing   Problem: RH Toilet Transfers Goal: LTG Patient will perform toilet transfers  w/assist (OT) Description: LTG: Patient will perform toilet transfers with assist, with/without cues using equipment (OT) Flowsheets (Taken 02/06/2024 1202) LTG: Pt will perform toilet transfers with assistance level of: Supervision/Verbal cueing   Problem: RH Tub/Shower Transfers Goal: LTG Patient will perform tub/shower transfers w/assist (OT) Description: LTG: Patient will perform tub/shower transfers with assist, with/without cues using equipment (OT) Flowsheets (Taken 02/06/2024 1202) LTG: Pt will perform tub/shower stall transfers with assistance level of: Supervision/Verbal cueing

## 2024-02-06 NOTE — Progress Notes (Signed)
 PHARMACY NOTE:  ANTIMICROBIAL RENAL DOSAGE ADJUSTMENT  Current antimicrobial regimen includes a mismatch between antimicrobial dosage and estimated renal function.  As per policy approved by the Pharmacy & Therapeutics and Medical Executive Committees, the antimicrobial dosage will be adjusted accordingly.  Current antimicrobial dosage:  amox/clav 500/125 q12hr  Indication: acute cholecystitis w/ percutaneous biliary drain   Renal Function:  Estimated Creatinine Clearance: 12.6 mL/min (A) (by C-G formula based on SCr of 5.44 mg/dL (H)). [x]      On intermittent HD, scheduled:TTS []      On CRRT    Antimicrobial dosage has been changed to:  500/125 at bedtime     Thank you for allowing pharmacy to be a part of this patient's care.  Jinnie Door, PharmD, BCPS, BCCP Clinical Pharmacist  Please check AMION for all [facility] Pharmacy phone numbers After 10:00 PM, call Main Pharmacy [phone]

## 2024-02-06 NOTE — Progress Notes (Signed)
 Inpatient Rehabilitation  Patient information reviewed and entered into eRehab system by [facility]. Karen Kays., CCC/SLP, PPS Coordinator.  Information including medical coding, functional ability and quality indicators will be reviewed and updated through discharge.

## 2024-02-06 NOTE — Progress Notes (Signed)
 Initial Nutrition Assessment  DOCUMENTATION CODES:   Severe malnutrition in context of chronic illness (ESRD on HD)  INTERVENTION:  Nepro Shake po TID, each supplement provides 425 kcal and 19 grams protein  RenaVit daily  Juven BID, each packet provides 95 calories, 2.5 grams of protein (collagen), and 9.8 grams of carbohydrate (3 grams sugar); also contains 7 grams of L-arginine and L-glutamine, 300 mg vitamin C, 15 mg vitamin E, 1.2 mcg vitamin B-12, 9.5 mg zinc , 200 mg calcium , and 1.5 g  Calcium  Beta-hydroxy-Beta-methylbutyrate to support wound healing  Encouraged adequate intake that will help meet increased needs for wound healing    NUTRITION DIAGNOSIS:   Severe Malnutrition related to chronic illness (ESRD on HD) as evidenced by severe muscle depletion, severe fat depletion, energy intake < or equal to 75% for > or equal to 1 month.  GOAL:   Patient will meet greater than or equal to 90% of their needs  MONITOR:   PO intake, Supplement acceptance  REASON FOR ASSESSMENT:   Consult Wound healing  ASSESSMENT:   Pt with hx of ESRD on HD, aortic stenosis, HLD, HTN, CAD, diabetes, and CHF. Recent admission where pt underwent L BKA 8/29. Admitted to CIR for functional deficits following BKA.  Pt will continue HD treatments TTS while admitted. Pt currently on fluid restriction, will appreciate nephrology's recommendations for fluid.   Spoke with pt and wife at bedside. Pt reports poor appetite throughout hospital admission and currently. Average intake 25% of 2 recorded meals. Discussed importance of adequate intake to help meet needs for post op healing and wound healing. Pt reports he does not want to eat because he feels like when he eats it will make him have to go to the bathroom and he is embarrassed to need help getting to the bathroom. Reassured pt that care team is here to help him and that needing assistance may only be temporary but in order to get stronger and work  with OT/PT he needs to eat enough to meet his nutrition needs. Pt verbalized understanding and will try to eat more.   PTA, pt's wife reports pt would eat 2x per day on dialysis days and anywhere from 1-3x on non-dialysis days. Dialysis Days: breakfast prior to HD consisted of sausage biscuit; misses lunch during HD; dinner after would be sandwich or whatever wife cooks Non-Dialysis Days: big breakfast consisting of eggs, [place] sausage, toast; would sometimes eat small lunch; sometimes would eat dinner which would be whatever wife cooks Intake PTA overall appears low and does not meet increased needs for dialysis.   Pt and wife report significant wt loss in last 6 months. Pt's EDW reported as 82 kg prior to BKA, has not been updated since loss of limb. Pt had physical exam conducted in March which showed moderate fat and muscle depletions and repeat physical exam now shows severe muscle and fat depletions. Difficult to quantify wt loss with loss of limb during this timeframe but suspect malnutrition playing role in muscle and fat depletions. Will monitor wt trends now that BKA has been completed.   NUTRITION - FOCUSED PHYSICAL EXAM:  Flowsheet Row Most Recent Value  Orbital Region Severe depletion  Upper Arm Region Severe depletion  Thoracic and Lumbar Region Severe depletion  Buccal Region Severe depletion  Temple Region Severe depletion  Clavicle Bone Region Severe depletion  Clavicle and Acromion Bone Region Severe depletion  Scapular Bone Region Severe depletion  Dorsal Hand Severe depletion  Patellar Region Severe depletion  [  L BKA]  Anterior Thigh Region Severe depletion  [L BKA]  Posterior Calf Region Severe depletion  [L BKA]  Edema (RD Assessment) None  Hair Reviewed  Eyes Reviewed  [wife reports vision issues]  Mouth Reviewed  Skin Reviewed  Nails Reviewed    Diet Order:   Diet Order             Diet renal with fluid restriction Fluid restriction: 1200 mL Fluid; Room  service appropriate? No; Fluid consistency: Thin  Diet effective now                   EDUCATION NEEDS:   Education needs have been addressed  Skin:  Skin Assessment: Skin Integrity Issues: Skin Integrity Issues:: Incisions Incisions: L BKA  Last BM:  9/4 type 5  Height:   Ht Readings from Last 1 Encounters:  02/05/24 6' (1.829 m)    Weight:   Wt Readings from Last 1 Encounters:  02/06/24 69.9 kg    Ideal Body Weight:  75.6 kg 80.9 kg - 5.3 kg (to account for 6.5% loss from BKA)  BMI:  Body mass index is 20.9 kg/m.  Estimated Nutritional Needs:   Kcal:  2000-2200  Protein:  90-105g  Fluid:  2-2.2L    Phillip Glance, MS, RDN, LDN Clinical Dietitian I Please reach out via secure chat

## 2024-02-06 NOTE — Progress Notes (Addendum)
 PROGRESS NOTE   Subjective/Complaints: Reports poor sleep last night.  Intermittent phantom pain noted.  No additional complaints or concerns.  ROS: Patient denies fever, new vision changes, dizziness, nausea, vomiting, diarrhea,  shortness of breath or chest pain, headache, or mood change.   Objective:   CT ABDOMEN PELVIS WO CONTRAST Result Date: 02/05/2024 CLINICAL DATA:  Cholecystostomy tube not flushing. Evaluate for placement location. EXAM: CT ABDOMEN AND PELVIS WITHOUT CONTRAST TECHNIQUE: Multidetector CT imaging of the abdomen and pelvis was performed following the standard protocol without IV contrast. RADIATION DOSE REDUCTION: This exam was performed according to the departmental dose-optimization program which includes automated exposure control, adjustment of the mA and/or kV according to patient size and/or use of iterative reconstruction technique. COMPARISON:  CT abdomen pelvis dated 02/02/2024. FINDINGS: Evaluation of this exam is limited in the absence of intravenous contrast. Lower chest: No acute abnormality. No intra-abdominal free air or free fluid. Hepatobiliary: The liver is unremarkable. Percutaneous cholecystostomy with pigtail tip in the gallbladder lumen in similar position as the prior CT. The gallbladder is filled with stones. There is thickened appearance of the gallbladder wall. Pancreas: Unremarkable. No pancreatic ductal dilatation or surrounding inflammatory changes. Spleen: Normal in size without focal abnormality. Adrenals/Urinary Tract: The adrenal glands are unremarkable. Mild right pelvicaliectasis. Excreted contrast noted in the renal collecting systems and urinary bladder. Stomach/Bowel: Dense oral contrast noted throughout the colon. There is no bowel obstruction or active inflammation. Appendectomy. Vascular/Lymphatic: Mild aortoiliac atherosclerotic disease. The IVC is unremarkable. No portal gas. There is  no adenopathy. Reproductive: The prostate and seminal vesicles are grossly unremarkable Other: None Musculoskeletal: Degenerative changes of the spine. Bilateral L5 pars defects with grade 1 anterolisthesis. No acute osseous pathology. IMPRESSION: 1. Percutaneous cholecystostomy with pigtail tip in the gallbladder lumen in similar position as the prior CT. 2. Cholelithiasis. 3. No bowel obstruction. 4.  Aortic Atherosclerosis (ICD10-I70.0). Electronically Signed   By: Vanetta Chou M.D.   On: 02/05/2024 20:04   Recent Labs    02/05/24 0650 02/05/24 1936  WBC 9.0 9.0  HGB 9.7* 9.4*  HCT 31.3* 31.6*  PLT 156 168   Recent Labs    02/05/24 0650 02/05/24 1936  NA 134* 133*  K 4.4 4.4  CL 91* 89*  CO2 28 28  GLUCOSE 118* 155*  BUN 18 25*  CREATININE 4.24* 5.44*  CALCIUM  8.8* 8.9   No intake or output data in the 24 hours ending 02/06/24 1405      Physical Exam: Vital Signs Blood pressure (!) 102/56, pulse 85, temperature 97.7 F (36.5 C), resp. rate 17, height 6' (1.829 m), weight 69.9 kg, SpO2 100%.  General: No apparent distress HEENT: Head is normocephalic, atraumatic Neck: Supple without JVD or lymphadenopathy Heart: Reg rate and rhythm. No murmurs rubs or gallops Chest: CTA bilaterally without wheezes, rales, or rhonchi; no distress Abdomen: Soft, non-tender, non-distended, bowel sounds positive.Percutaneous Chole drain   Extremities: Left BKA with shrinker and limb guard in place. Psych: Pt's affect is appropriate. Pt is cooperative Skin: Left aVF Neuro: Alert and oriented x 4, follows commands.  Cranial nerves II through XII grossly intact. Bilateral upper extremity strength 5 out of  5. Right lower extremity strength 4 out of 5 proximal, 5 out of 5 distal. Left lower extremity at least 3-5 Sensation intact light touch in all extremities  Assessment/Plan: 1. Functional deficits which require 3+ hours per day of interdisciplinary therapy in a comprehensive inpatient  rehab setting. Physiatrist is providing close team supervision and 24 hour management of active medical problems listed below. Physiatrist and rehab team continue to assess barriers to discharge/monitor patient progress toward functional and medical goals  Care Tool:  Bathing              Bathing assist       Upper Body Dressing/Undressing Upper body dressing        Upper body assist      Lower Body Dressing/Undressing Lower body dressing            Lower body assist       Toileting Toileting    Toileting assist Assist for toileting: Total Assistance - Patient < 25%     Transfers Chair/bed transfer  Transfers assist     Chair/bed transfer assist level: Moderate Assistance - Patient 50 - 74%     Locomotion Ambulation   Ambulation assist   Ambulation activity did not occur: Safety/medical concerns          Walk 10 feet activity   Assist  Walk 10 feet activity did not occur: Safety/medical concerns        Walk 50 feet activity   Assist Walk 50 feet with 2 turns activity did not occur: Safety/medical concerns         Walk 150 feet activity   Assist Walk 150 feet activity did not occur: Safety/medical concerns         Walk 10 feet on uneven surface  activity   Assist Walk 10 feet on uneven surfaces activity did not occur: Safety/medical concerns         Wheelchair     Assist Is the patient using a wheelchair?: Yes Type of Wheelchair: Manual    Wheelchair assist level: Supervision/Verbal cueing Max wheelchair distance: 150'    Wheelchair 50 feet with 2 turns activity    Assist        Assist Level: Supervision/Verbal cueing   Wheelchair 150 feet activity     Assist      Assist Level: Supervision/Verbal cueing   Blood pressure (!) 102/56, pulse 85, temperature 97.7 F (36.5 C), resp. rate 17, height 6' (1.829 m), weight 69.9 kg, SpO2 100%.  Medical Problem List and Plan: 1. Functional  deficits secondary to left BKA 01/30/2024 after failed TMA with history of PAD status post PCI to left tibia/fibula angioplasty              -patient may  shower             -ELOS/Goals: 7 days with mod I goals for mobility and self-care  -Continue CIR 2.  Antithrombotics: -DVT/anticoagulation:  SQ heparin              -antiplatelet therapy: Aspirin  81 mg daily, Plavix  75 mg daily 3. Pain Management: Oxycodone  as needed             - 9/5 start gabapentin  100 mg 3 times a week after dialysis for phantom pain  - Changed to Lyrica , patient reported results with gabapentin  previously.  25 mg now 3 times a week after dialysis 4. Mood/Behavior/Sleep: Provide emotional support             -  antipsychotic agents: N/A  - 9/5 melatonin as needed insomnia 5. Neuropsych/cognition: This patient is capable of making decisions on his own behalf. 6. Skin/Wound Care: Routine skin checks 7. Fluids/Electrolytes/Nutrition: Routine in and out with follow-up chemistries 8.  Aortic stenosis.  Status post TAVR 01/20/2024 9.  End-stage renal disease.  Continue hemodialysis as per renal services             -HD at end of day to allow participation in therapy  - 9/5 reviewed nephrology note, next hemodialysis 9/6 10.  Hyperlipidemia.  Lipitor 11.  Hypertension.  Coreg  3.125 mg twice daily, Imdur  120 mg daily.  Monitor with increased mobility    02/06/2024    1:25 PM 02/06/2024    9:00 AM 02/06/2024    4:31 AM  Vitals with BMI  Weight   154 lbs 2 oz  BMI   20.9  Systolic 102 114 898  Diastolic 56 68 62  Pulse 85 80 76  9/5 continue current regimen and monitor trend 12.  Hypothyroidism.  Synthroid  13.  Acute on chronic anemia.  Continue Aranesp .  Follow-up CBC  - 9/5 hemoglobin stable at 9.4 14.  CAD with history of CABG.  No chest pain or shortness of breath.  Continue Imdur  15.  BPH.  Flomax  0.4 mg daily. 16.  Chronic diastolic congestive heart failure.  Monitor for any signs of fluid overload.  Follow-up per  cardiology services.             -daily weights             -volume mgt with HD Filed Weights   02/05/24 1829 02/06/24 0431  Weight: 68.3 kg 69.9 kg    17.  Recent acute cholecystitis June 2025.  Currently maintained on Augmentin  and linezolid  for 3 to 4 weeks and will confirm duration of antibiotic.  Radiology following  for percutaneous cholecystostomy tube for recent cholecystitis. Awaiting plan for continued suction drainage versus change to gravity bag.     LOS: 1 days A FACE TO FACE EVALUATION WAS PERFORMED  Murray Collier 02/06/2024, 2:05 PM

## 2024-02-06 NOTE — Progress Notes (Addendum)
 Inpatient Rehabilitation Admission Medication Review by a Pharmacist  A complete drug regimen review was completed for this patient to identify any potential clinically significant medication issues.  High Risk Drug Classes Is patient taking? Indication by Medication  Antipsychotic No   Anticoagulant Yes Heparin  for DVT ppx   Antibiotic Yes Amox/clav, linezolid  for acute cholecystitis requiring cholecystectomy   Opioid Yes Oxycodone  for acute pain   Antiplatelet Yes Aspirin , clopidogrel  for CAD and PVD  Hypoglycemics/insulin  No   Vasoactive Medication Yes Carvedilol  for HFrEF   Chemotherapy No   Other Yes Imdur , NTG for angina Levothyroxine  for hypothyroidism Sevelamer  for hyperphosphatemia  Tamsulosin  for BPH Acetaminophen  for mild pain Albuterol  for shortness of breath Alteplase  for clogged line Bisacodyl  for constipation     Type of Medication Issue Identified Description of Issue Recommendation(s)  Drug Interaction(s) (clinically significant)     Duplicate Therapy     Allergy     No Medication Administration End Date  Augmentin  and Linezolid  for 3-4 weeks per notes. Cholecystostomy tube exchanged 02/03/2024  Added 9/29 end date   Incorrect Dose     Additional Drug Therapy Needed     Significant med changes from prior encounter (inform family/care partners about these prior to discharge). HELD home gabapentin , insulin  glargine, latanoprost , tramadol   NEW augmentin , linezolid  Asked to restart latanoprost    Review at discharge   Other       Clinically significant medication issues were identified that warrant physician communication and completion of prescribed/recommended actions by midnight of the next day:  No  Name of provider notified for urgent issues identified:   Provider Method of Notification:     Pharmacist comments:   Time spent performing this drug regimen review (minutes):  25  Jinnie Door, PharmD, BCPS, [facility] Clinical Pharmacist  Please check  AMION for all [facility] phone numbers After 10:00 PM, call Main Pharmacy [phone]

## 2024-02-06 NOTE — Progress Notes (Signed)
 Occupational Therapy Session Note  Patient Details  Name: Phillip [name] MRN: [id number] Date of Birth: [birth year]  Today's Date: 02/06/2024 OT Individual Time: 1430-1545 OT Individual Time Calculation (min): 75 min    Short Term Goals: Week 1:  OT Short Term Goal 1 (Week 1): Pt will complete LB bathing with Mod A OT Short Term Goal 2 (Week 1): Pt will complete toilet transfer with Min A OT Short Term Goal 3 (Week 1): Pt will complete LB dressing with Mod A  Skilled Therapeutic Interventions/Progress Updates:  Skilled OT session completed to address ADL retraining and functional transfers. Pt received supine in bed with wife and family present, agreeable to participate in therapy. Pt reports 8/10 pain in LLE, OT provided pain intervention by repositioning and notifying nurse for pain medication.   Pt completed LB dressing Max A in bed, VC to bridge and pull up pants to increase participation. Wife cued to limit physical A and allow pt to participate, verbalized understanding. Supine>EOB CGA d/t L lateral lean. Pt reports increased tiredness at this time. EOB>WC SB Mod A. OT educates patient on DABSC, pt reports has one within the home. Pt refuses need to void and displays increased anxiety about entering bathroom. DABSC brought to bedside. WC><BSC SB Mod A. Pt required seated rest break before completing return transfer. 3x STS at [facility] with Mod-MaxA with seated rest breaks. Pt requests to return back to bed d/t fatigue. WC>EOB scoot pivot transfer Max A.   Education provided on residual limb and limb care at bedside and importance of skin checks. Pt verbalized understanding, OT removes shrinker to demonstrate skin check. Residual limb bleeding noted, nursing staff notified. Shrinker placed on residual limb.   Pt in supine with all needs met and bed alarm on.   Therapy Documentation Precautions:  Precautions Precautions: Fall Recall of Precautions/Restrictions:  Intact Precaution/Restrictions Comments: R chole drain Required Braces or Orthoses: Other Brace Other Brace: limb guard Restrictions Weight Bearing Restrictions Per Provider Order: No LLE Weight Bearing Per Provider Order: Non weight bearing    Therapy/Group: Individual Therapy  Amanada Philbrick Woods-Chance, MS, OTR/L 02/06/2024, 3:32 PM

## 2024-02-06 NOTE — Evaluation (Signed)
 Occupational Therapy Assessment and Plan  Patient Details  Name: Phillip [name] MRN: [id number] Date of Birth: Dec 06, 1955  OT Diagnosis: abnormal posture and muscle weakness (generalized) Rehab Potential: Rehab Potential (ACUTE ONLY): Fair ELOS: 10-12 days   Today's Date: 02/06/2024 OT Individual Time: 8984-8884 OT Individual Time Calculation (min): 60 min     Hospital Problem: Principal Problem:   Left below-knee amputee [facility])   Past Medical History:  Past Medical History:  Diagnosis Date   Abdominopelvic abscess (HCC) 09/29/2018   Abscess of appendix 09/22/2018   Acquired spondylolisthesis of lumbosacral region 03/20/2020   Acute cholecystitis 11/15/2023   Acute kidney injury (HCC) 10/07/2018   Acute kidney injury superimposed on chronic kidney disease (HCC) 11/06/2020   Acute kidney injury superimposed on CKD (HCC) 02/18/2020   Acute on chronic systolic (congestive) heart failure (HCC) 07/26/2015   Formatting of this note might be different from the original. Formatting of this note might be different from the original. Formatting of this note might be different from the original. revenkar EF 35% Formatting of this note might be different from the original. revenkar EF 35%     Acute on chronic systolic CHF (congestive heart failure) (HCC) 08/12/2023   Anasarca 07/01/2023   Anemia of chronic disease 08/18/2019   Anemia, chronic disease 08/18/2019   Aortic atherosclerosis (HCC)    Aortic stenosis    a.) TTE 09/15/2018: mild-mod (MPG 19); b.) TTE 03/21/2021: mild-mod (MPG 14.3); c.) TTE 04/24/2022: mild- mod (MPG 15)   Aortic stenosis, moderate 10/15/2019   Appendicitis 09/08/2018   ARF (acute renal failure) (HCC) 02/19/2020   Asthma    Atherosclerosis of native arteries of the extremities with ulceration (HCC) 02/24/2018   Formatting of this note might be different from the original.  Last Assessment & Plan:   His ABIs today are 1.07 on the right and 0.99 on the left with  biphasic waveforms.  Although these pressures may be somewhat elevated from medial calcification, his flow does appear to be reasonably good.  His left ABI was 0.58 prior to intervention.  At this point, we will stretch out his follow-up and see hi   Benign hypertension with CKD (chronic kidney disease) stage IV (HCC) 02/24/2018   Benign prostatic hyperplasia without lower urinary tract symptoms 01/14/2018   Bilateral lower extremity edema 11/04/2018   Bradycardia 03/25/2018   Bruit of right carotid artery 07/26/2015   CAD (coronary artery disease) 2003   a.) s/p 4v CABG 2003   Cardiac murmur 07/26/2015   Choledocholithiasis 11/12/2023   Chronic combined systolic and diastolic CHF (congestive heart failure) (HCC) 07/26/2015   a.) TTE 09/15/2018: EF 50-55%, mod LVH, RVE, BAE, mild-mod TR, AoV sclerosis, G1DD; b.) MPI 10/27/2019: EF <30%; c.) TTE 03/21/2021: EF 35-40%, post AK, inf HK, mod LVH, mod red RVSF, mod LAE, mod Aov sclerosis, G2DD; c.) TTE 04/24/2022: EF 35-40%, post AK, glok HK, mod LVE, mod red RVSF, mild-mod MR, Aov sclerosis, G3DD   Chronic idiopathic constipation 11/02/2018   Chronic low back pain without sciatica 12/02/2018   Chronic pain of right hip 03/31/2020   Chronic systolic heart failure (HCC) 07/26/2015   Formatting of this note might be different from the original. revenkar EF 35%     CKD (chronic kidney disease) stage 4, GFR 15-29 ml/min (HCC) 10/07/2018   Congestive heart failure (HCC) 07/26/2015   Last Assessment & Plan:   Poor cardiac function could certainly be contributing to poor blood supply to the feet and toes as  well.     Formatting of this note might be different from the original.  Formatting of this note might be different from the original.  Last Assessment & Plan:   Poor cardiac function could certainly be contributing to poor blood supply to the feet and toes as well.     Last   Coronary artery disease    Diabetes mellitus without complication (HCC)     Dysphagia 07/26/2015   Encounter for long-term (current) use of insulin  (HCC) 09/27/2020   Erectile dysfunction 07/14/2017   ESRD on dialysis [facility]) 11/15/2023   Essential hypertension 02/24/2018   Fever 11/07/2020   Foot ulcer (HCC) 09/27/2020   left foot  Formatting of this note might be different from the original.  Formatting of this note might be different from the original.  left foot     Foot ulcer, left (HCC)    GERD (gastroesophageal reflux disease)    Heart murmur 07/26/2015   Hematuria 07/27/2015   High risk medication use 07/26/2015   History of marijuana use    Hyperkalemia 05/29/2020   Hyperlipidemia 07/26/2015   Hyperlipidemia associated with type 2 diabetes mellitus (HCC) 02/18/2020   Hypertension    Hypertension associated with diabetes (HCC)    Hypoglycemia 05/07/2019   Hypothyroidism 11/14/2015   Hypothyroidism (acquired) 11/14/2015   Insomnia 11/02/2018   Kidney insufficiency    Lumbar spondylosis 03/20/2020   Malaise and fatigue 03/25/2018   Malnutrition of moderate degree 08/20/2023   Mitral regurgitation 05/12/2019   Moderate aortic regurgitation 05/12/2019   Myocardial infarction due to demand ischemia (HCC) 09/26/2020   a.) Type II NSTEMI; b.) troponins were trended 0.54 --> 0.56 --> 0.52 ng/mL   Non-compliance 05/05/2019   NSTEMI (non-ST elevated myocardial infarction) (HCC) 09/27/2020   Osseous and subluxation stenosis of intervertebral foramina of lumbar region 03/20/2020   Osteoarthritis 10/06/2019   Pancytopenia (HCC) 08/12/2023   Peripheral vascular disease (HCC)    Personal history of tobacco use, presenting hazards to health 09/27/2020   Polyneuropathy in diabetes (HCC) 05/07/2019   Primary osteoarthritis involving multiple joints 10/06/2019   Primary osteoarthritis of right hip 02/21/2020   Pulmonary HTN (HCC)    a.) TTE 05/12/2019: PASP 71; b.) TTE 09/27/2020: PASP >70; c.) TTE 03/21/2021: RVSP 43; d.) TTE 04/24/2022: RVSP 80.6   Puncture  wound of right hip 04/02/2018   PVD (peripheral vascular disease) with claudication (HCC)    a.) s/p PTA 03/02/2018 - balloon angioplasty LEFT below knee popliteal artery; b.) s/p PTA 09/30/2022: baloon angioplasty LEFT tibioperoneal trunck, most proximal peroneal artery, and LEFT popliteal artery.   Regional wall motion abnormality of heart 05/07/2019   Right groin pain 02/18/2020   S/P CABG x 4 2003   S/P TAVR (transcatheter aortic valve replacement) 01/20/2024   TAVR with a 29 mm Edwards Sapien 3 Ultra Resilia THV via the TF by Dr. Wendel and Dr. Shyrl   Screening for colon cancer 02/18/2018   Sepsis (HCC) 11/15/2023   Sleep apnea 07/26/2015   a.) does not require nocturnal PAP therapy   Subacute osteomyelitis of left foot (HCC) 03/25/2018   Thrombocytopenia (HCC)    Type 2 diabetes mellitus with hyperlipidemia (HCC) 07/26/2015   Type 2 diabetes mellitus with stage 4 chronic kidney disease, with long-term current use of insulin  (HCC) 07/26/2015   Type II diabetes mellitus with neurological manifestations (HCC) 02/18/2020   Vitamin B12 deficiency 06/16/2019   Vitamin D deficiency 05/07/2018   Past Surgical History:  Past Surgical History:  Procedure Laterality Date   AMPUTATION Left 01/14/2024   Procedure: AMPUTATION, FOOT, PARTIAL;  Surgeon: Serene Gaile ORN, MD;  Location: [facility] OR;  Service: Vascular;  Laterality: Left;   AMPUTATION Left 01/30/2024   Procedure: AMPUTATION BELOW KNEE;  Surgeon: Serene Gaile ORN, MD;  Location: [facility] OR;  Service: Vascular;  Laterality: Left;   AMPUTATION TOE Left 03/13/2018   Procedure: AMPUTATION TOE-MPJ;  Surgeon: Ashley Soulier, DPM;  Location: [facility] ORS;  Service: Podiatry;  Laterality: Left;   ANGIOPLASTY     AV FISTULA PLACEMENT Left 08/20/2023   Procedure: ARTERIOVENOUS (AV) FISTULA CREATION;  Surgeon: Gretta Lonni PARAS, MD;  Location: [facility] OR;  Service: Vascular;  Laterality: Left;   CARDIAC CATHETERIZATION     CORONARY ARTERY BYPASS GRAFT  N/A 2003   INTRAOPERATIVE TRANSTHORACIC ECHOCARDIOGRAM N/A 01/20/2024   Procedure: ECHOCARDIOGRAM, TRANSTHORACIC;  Surgeon: Wendel Lurena POUR, MD;  Location: [facility] INVASIVE CV LAB;  Service: Cardiovascular;  Laterality: N/A;   IR CATHETER TUBE CHANGE  12/31/2023   IR EXCHANGE BILIARY DRAIN  02/03/2024   IR FLUORO GUIDE CV LINE RIGHT  08/18/2023   IR PERC CHOLECYSTOSTOMY  11/12/2023   IR RADIOLOGIST EVAL & MGMT  12/31/2023   IR US  GUIDE VASC ACCESS RIGHT  08/18/2023   LAPAROSCOPIC APPENDECTOMY N/A 09/08/2018   Procedure: APPENDECTOMY LAPAROSCOPIC;  Surgeon: Jordis Laneta FALCON, MD;  Location: [facility] ORS;  Service: General;  Laterality: N/A;   LOWER EXTREMITY ANGIOGRAPHY Left 03/02/2018   Procedure: LOWER EXTREMITY ANGIOGRAPHY;  Surgeon: Marea Selinda RAMAN, MD;  Location: [facility] INVASIVE CV LAB;  Service: Cardiovascular;  Laterality: Left;   LOWER EXTREMITY ANGIOGRAPHY Left 09/30/2022   Procedure: Lower Extremity Angiography;  Surgeon: Marea Selinda RAMAN, MD;  Location: [facility] INVASIVE CV LAB;  Service: Cardiovascular;  Laterality: Left;   LOWER EXTREMITY ANGIOGRAPHY Left 12/26/2023   Procedure: Lower Extremity Angiography;  Surgeon: Marea Selinda RAMAN, MD;  Location: [facility] INVASIVE CV LAB;  Service: Cardiovascular;  Laterality: Left;   RIGHT/LEFT HEART CATH AND CORONARY/GRAFT ANGIOGRAPHY N/A 08/01/2023   Procedure: RIGHT/LEFT HEART CATH AND CORONARY/GRAFT ANGIOGRAPHY;  Surgeon: Wendel Lurena POUR, MD;  Location: [facility] INVAS[facility] CV LAB;  Service: Cardiovascular;  Laterality: N/A;   ROTATOR CUFF REPAIR Left    TRANSMETATARSAL AMPUTATION Left 01/16/2024   Procedure: AMPUTATION, FOOT, TRANSMETATARSAL;  Surgeon: Serene Gaile ORN, MD;  Location[facility] OR;  Service: Vascular;  Laterality: Left;    Assessment & Plan Clinical Impression: Phillip [name] is a 68 year old right-handed male with history significant for end-stage renal disease with hemodialysis Tuesday Thursday Saturday, hyperlipidemia, diabetes mellitus, hypertension, bradycardia, CAD  with CABG 2003 maintained on aspirin  as well as Plavix ., recent acute cholecystitis June 2025 requiring cholecystostomy tube placement, anemia of chronic disease, BPH, chronic diastolic congestive heart failure, aortic stenosis (TAVR postponed due to left third toe gangrene), PAD status post PCI to left tibia fibula/angioplasty 7/25 as well as left toe amputation 2019 . Per chart review patient lives with spouse. 1 level home 2 steps to enter. Ambulates with assist device. Independent with ADLs. Presented 01/14/2024 for elective left third toe amputation due to nonhealing ulcer with gangrenous changes. Noninvasive vascular study showed noncompressible vessels hence underwent an angiogram of left lower extremity with transluminal angioplasty/stent placement to the left tibial peroneal trunk on 7/25 at [facility]. Underwent left third toe amputation including metatarsal head 01/14/2024 per Dr. Serene. Dr [facility] course nephrology consulted with dialysis ongoing as directed. Due to nonhealing left toe wound underwent left transmetatarsal amputation 01/16/2024 and wound VAC applied and removed  01/23/2024. Follow-up cardiology services for severe aortic stenosis and recent delay in TAVR procedure due to left toe gangrene and was cleared for surgery undergoing TAVR 01/20/2024 per Dr. Shyrl. Close monitoring of recent left TMA with poor healing and limb was not felt to be salvageable undergoing left below-knee amputation 01/30/2024 per Dr. Serene and limb guard applied. Acute on chronic anemia latest hemoglobin 7.9. Patient remains on aspirin  and Plavix  as prior to admission. SQ heparin  added for DVT prophylaxis. Radiology service follow-up for percutaneous cholecystostomy tube from recent acute cholecystitis June 2025 and currently remains on Augmentin  and linezolid  for 3 to 4 weeks. Patient has remained afebrile with no leukocytosis some erythema around the percutaneous biliary drain site with some purulent drainage soaking the  gauze. CT of the abdomen pelvis showed some soft tissue thickening and stranding with minimal fluid seen adjacent to the portion of the tubing. No peri-cholecystic collection and awaiting plan to assess for continued suction drainage versus change to gravity bag. Therapy evaluations completed due to patient decreased functional mobility was admitted for a comprehensive rehab program. Patient transferred to CIR on 02/05/2024 .    Patient currently requires max with basic self-care skills secondary to muscle weakness and decreased memory and delayed processing.  Prior to hospitalization, patient could complete basic ADLs, driving, caretaking with modified independent .  Patient will benefit from skilled intervention to decrease level of assist with basic self-care skills prior to discharge home with care partner.  Anticipate patient will require 24 hour supervision and follow up home health.     OT Evaluation Precautions/Restrictions  Precautions Precautions: Fall Recall of Precautions/Restrictions: Intact Precaution/Restrictions Comments: R chole drain, L BKA Required Braces or Orthoses: Other Brace Other Brace: limb guard Restrictions Weight Bearing Restrictions Per Provider Order: No LLE Weight Bearing Per Provider Order: Non weight bearing Vital Signs Therapy Vitals Temp: 98.1 F (36.7 C) Temp Source: Oral Pulse Rate: 76 Resp: 19 BP: 101/62 Patient Position (if appropriate): Lying Oxygen Therapy SpO2: 98 % O2 Device: Room Air Pain Pain Assessment Pain Scale: 0-10 Pain Score: 0-No pain Pain Location: Leg Pain Intervention(s): Medication (See eMAR) Home Living/Prior Functioning Home Living Family/patient expects to be discharged to:: Private residence Living Arrangements: Spouse/significant other Vision Baseline Vision/History: 1 Wears glasses (reading glasses) Ability to See in Adequate Light: 0 Adequate Vision Assessment?: No apparent visual deficits Perception   Perception: (P) Within Functional Limits Praxis Praxis: (P) WFL Cognition Cognition Overall Cognitive Status: History of cognitive impairments - at baseline Arousal/Alertness: Awake/alert Orientation Level: Person;Place;Situation Person: Oriented Place: Oriented Situation: Oriented Memory: Impaired Memory Impairment: Decreased long term memory Decreased Long Term Memory: Verbal basic Awareness: Appears intact Problem Solving: Appears intact Safety/Judgment: Appears intact Brief Interview for Mental Status (BIMS) Repetition of Three Words (First Attempt): 3 Temporal Orientation: Year: Missed by more than 5 years Temporal Orientation: Month: Missed by more than 1 month Temporal Orientation: Day: Incorrect Recall: Sock: Yes, after cueing (something to wear) Recall: Blue: Yes, no cue required Recall: Bed: Yes, no cue required BIMS Summary Score: 8 Sensation Sensation Light Touch: Appears Intact Hot/Cold: Not tested Proprioception: Appears Intact Stereognosis: Not tested Coordination Fine Motor Movements are Fluid and Coordinated: Yes Motor  Motor Motor: Within Functional Limits  Trunk/Postural Assessment  Cervical Assessment Cervical Assessment: Exceptions to [facility] Thoracic Assessment Thoracic Assessment: Exceptions to [facility] Lumbar Assessment Lumbar Assessment: Exceptions to [facility] Postural Control Postural Control: Deficits on evaluation  Balance Balance Balance Assessed: Yes Static Sitting Balance Static Sitting - Level of Assistance:  5: Stand by assistance Dynamic Sitting Balance Dynamic Sitting - Level of Assistance: 5: Stand by assistance;4: Min assist Static Standing Balance Static Standing - Level of Assistance: 2: Max assist Dynamic Standing Balance Dynamic Standing - Level of Assistance: Not tested (comment) Extremity/Trunk Assessment RUE Assessment RUE Assessment: Within Functional Limits LUE Assessment LUE Assessment: Within Functional  Limits  Care Tool Care Tool Self Care Eating   Eating Assist Level: Minimal Assistance - Patient > 75%    Oral Care    Oral Care Assist Level: Set up assist    Bathing   Body parts bathed by patient: Right arm;Left arm;Chest;Face;Abdomen;Front perineal area;Right upper leg Body parts bathed by helper: Buttocks Body parts n/a: Left lower leg Assist Level: Moderate Assistance - Patient 50 - 74%    Upper Body Dressing(including orthotics)   What is the patient wearing?: Pull over shirt   Assist Level: Minimal Assistance - Patient > 75%    Lower Body Dressing (excluding footwear)   What is the patient wearing?: Pants;Ace wrap/stump shrinker Assist for lower body dressing: Maximal Assistance - Patient 25 - 49%    Putting on/Taking off footwear     Assist for footwear: Maximal Assistance - Patient 25 - 49%       Care Tool Toileting Toileting activity   Assist for toileting: Maximal Assistance - Patient 25 - 49%     Care Tool Bed Mobility Roll left and right activity   Roll left and right assist level: Contact Guard/Touching assist    Sit to lying activity   Sit to lying assist level: Contact Guard/Touching assist    Lying to sitting on side of bed activity   Lying to sitting on side of bed assist level: the ability to move from lying on the back to sitting on the side of the bed with no back support.: Contact Guard/Touching assist     Care Tool Transfers Sit to stand transfer   Sit to stand assist level: Maximal Assistance - Patient 25 - 49%    Chair/bed transfer   Chair/bed transfer assist level: Moderate Assistance - Patient 50 - 74%     Toilet transfer   Assist Level: Maximal Assistance - Patient 24 - 49%     Care Tool Cognition  Expression of Ideas and Wants Expression of Ideas and Wants: 4. Without difficulty (complex and basic) - expresses complex messages without difficulty and with speech that is clear and easy to understand  Understanding Verbal and  Non-Verbal Content Understanding Verbal and Non-Verbal Content: 4. Understands (complex and basic) - clear comprehension without cues or repetitions   Memory/Recall Ability Memory/Recall Ability : That he or she is in a hospital/hospital unit   Refer to Care Plan for Long Term Goals  SHORT TERM GOAL WEEK 1 OT Short Term Goal 1 (Week 1): Pt will complete LB bathing with Mod A OT Short Term Goal 2 (Week 1): Pt will complete toilet transfer with Min A OT Short Term Goal 3 (Week 1): Pt will complete LB dressing with Mod A  Recommendations for other services: Therapeutic Recreation  Pet therapy and Stress management   Skilled Therapeutic Intervention ADL ADL Grooming: Setup Where Assessed-Grooming: Chair;Sitting at sink Upper Body Bathing: Minimal assistance Where Assessed-Upper Body Bathing: Sitting at sink Lower Body Bathing: Maximal assistance Where Assessed-Lower Body Bathing: Sitting at sink Upper Body Dressing: Minimal assistance Where Assessed-Upper Body Dressing: Chair Lower Body Dressing: Maximal assistance Where Assessed-Lower Body Dressing: Chair Toileting: Maximal assistance Where Assessed-Toileting: Actuary  Transfer: Maximal verbal cueing Mobility  Transfers Sit to Stand: Moderate Assistance - Patient 50-74%  1:1 evaluation and treatment session initiated this date. OT roles, goals and purpose discussed with pt as well as therapy schedule. Sink level ADL completed this date with levels of assist listed above. Min A for UB dressing d/t R chole drain. Mod A STS at grab bars. Pt declined removal of limb guard and shrinker d/t pain in LLE. Pt premedicated prior to OT session. Educated on phantom pain and sensation post BKA. Pt declined shower, presenting with increased anxiety about hospital stay and mobility as a result of BKA. Education provided on goal of IPR and therapy schedule. Pt and family verbalized understanding. Cognitive impairments at baseline wife reports,  Long term memory deficits noticed in eval, pt could not recall year, date, or month. Speech referral to f/u. Pt would benefit from skilled OT in IPR setting in order to maximize independence with ADLs upon D/C.   Discharge Criteria: Patient will be discharged from OT if patient refuses treatment 3 consecutive times without medical reason, if treatment goals not met, if there is a change in medical status, if patient makes no progress towards goals or if patient is discharged from hospital.  The above assessment, treatment plan, treatment alternatives and goals were discussed and mutually agreed upon: by patient and by family  Aradia Estey Woods-Chance, MS, OTR/L 02/06/2024, 7:58 AM

## 2024-02-06 NOTE — Progress Notes (Addendum)
 [facility] Progress Note   Subjective:    Now in CIR. Seen and examined patient at bedside. Patient's wife and grand-daughter also at bedside. He reports feeling well and denies any acute complaints. Noted last HD on 9/3 and 2L was removed. Next HD 9/6.  Objective Vitals:   02/05/24 1829 02/05/24 2053 02/06/24 0431 02/06/24 0900  BP: 107/64 112/72 101/62 114/68  Pulse: 79 97 76 80  Resp: 16 16 19    Temp: 97.8 F (36.6 C) 97.8 F (36.6 C) 98.1 F (36.7 C)   TempSrc: Oral Oral Oral   SpO2: 100% 99% 98%   Weight: 68.3 kg  69.9 kg   Height: 6' (1.829 m)      Physical Exam General: Well appearing man, NAD Heart: RRR Lungs: CTAB Abdomen: soft, R perc chole tube in place, bloody drainage in bulb today Extremities: No edema, L BKA bandaged Dialysis Access: LUE AVF +t/b  Filed Weights   02/05/24 1829 02/06/24 0431  Weight: 68.3 kg 69.9 kg   No intake or output data in the 24 hours ending 02/06/24 1214  Additional Objective Labs: Basic Metabolic Panel: Recent Labs  Lab 02/03/24 1630 02/05/24 0650 02/05/24 1936  NA 128* 134* 133*  K 4.6 4.4 4.4  CL 89* 91* 89*  CO2 22 28 28   GLUCOSE 137* 118* 155*  BUN 62* 18 25*  CREATININE 9.08* 4.24* 5.44*  CALCIUM  8.1* 8.8* 8.9  PHOS 4.4  --  4.5   Liver Function Tests: Recent Labs  Lab 02/03/24 1630 02/05/24 1936  ALBUMIN  2.1* 2.4*   No results for input(s): LIPASE, AMYLASE in the last 168 hours. CBC: Recent Labs  Lab 01/31/24 0341 02/01/24 0342 02/03/24 1630 02/05/24 0650 02/05/24 1936  WBC 10.3 10.5 8.9 9.0 9.0  NEUTROABS  --   --   --  6.9  --   HGB 8.5* 8.8* 7.9* 9.7* 9.4*  HCT 27.6* 27.9* 25.7* 31.3* 31.6*  MCV 96.2 95.9 96.3 96.3 96.6  PLT 139* 151 144* 156 168   Blood Culture    Component Value Date/Time   SDES BILE 11/12/2023 1247   SPECREQUEST NONE 11/12/2023 1247   CULT MODERATE STREPTOCOCCUS SALIVARIUS 11/12/2023 1247   REPTSTATUS 11/14/2023 FINAL 11/12/2023 1247     Cardiac Enzymes: No results for input(s): CKTOTAL, CKMB, CKMBINDEX, TROPONINI in the last 168 hours. CBG: Recent Labs  Lab 02/04/24 0631 02/04/24 2110 02/05/24 0559 02/05/24 1126 02/05/24 1706  GLUCAP 100* 121* 108* 134* 124*   Iron  Studies: No results for input(s): IRON , TIBC, TRANSFERRIN, FERRITIN in the last 72 hours. Lab Results  Component Value Date   INR 1.1 01/19/2024   INR 1.3 (H) 11/12/2023   INR 1.4 (H) 11/11/2023   Studies/Results: CT ABDOMEN PELVIS WO CONTRAST Result Date: 02/05/2024 CLINICAL DATA:  Cholecystostomy tube not flushing. Evaluate for placement location. EXAM: CT ABDOMEN AND PELVIS WITHOUT CONTRAST TECHNIQUE: Multidetector CT imaging of the abdomen and pelvis was performed following the standard protocol without IV contrast. RADIATION DOSE REDUCTION: This exam was performed according to the departmental dose-optimization program which includes automated exposure control, adjustment of the mA and/or kV according to patient size and/or use of iterative reconstruction technique. COMPARISON:  CT abdomen pelvis dated 02/02/2024. FINDINGS: Evaluation of this exam is limited in the absence of intravenous contrast. Lower chest: No acute abnormality. No intra-abdominal free air or free fluid. Hepatobiliary: The liver is unremarkable. Percutaneous cholecystostomy with pigtail tip in the gallbladder lumen in similar position as the prior CT. The  gallbladder is filled with stones. There is thickened appearance of the gallbladder wall. Pancreas: Unremarkable. No pancreatic ductal dilatation or surrounding inflammatory changes. Spleen: Normal in size without focal abnormality. Adrenals/Urinary Tract: The adrenal glands are unremarkable. Mild right pelvicaliectasis. Excreted contrast noted in the renal collecting systems and urinary bladder. Stomach/Bowel: Dense oral contrast noted throughout the colon. There is no bowel obstruction or active inflammation.  Appendectomy. Vascular/Lymphatic: Mild aortoiliac atherosclerotic disease. The IVC is unremarkable. No portal gas. There is no adenopathy. Reproductive: The prostate and seminal vesicles are grossly unremarkable Other: None Musculoskeletal: Degenerative changes of the spine. Bilateral L5 pars defects with grade 1 anterolisthesis. No acute osseous pathology. IMPRESSION: 1. Percutaneous cholecystostomy with pigtail tip in the gallbladder lumen in similar position as the prior CT. 2. Cholelithiasis. 3. No bowel obstruction. 4.  Aortic Atherosclerosis (ICD10-I70.0). Electronically Signed   By: Vanetta Chou M.D.   On: 02/05/2024 20:04    Medications:  anticoagulant sodium citrate       (feeding supplement) PROSource Plus  30 mL Oral BID BM   amoxicillin -clavulanate  1 tablet Oral QHS   aspirin  EC  81 mg Oral Daily   atorvastatin   20 mg Oral Daily   carvedilol   3.125 mg Oral BID   clopidogrel   75 mg Oral Daily   heparin  injection (subcutaneous)  5,000 Units Subcutaneous Q8H   isosorbide  mononitrate  120 mg Oral Daily   latanoprost   1 drop Both Eyes QHS   levothyroxine   50 mcg Oral Daily   linezolid   600 mg Oral Q12H   sevelamer  carbonate  800 mg Oral TID WC   tamsulosin   0.4 mg Oral QPC breakfast    Dialysis Orders: TTS [facility] 4hr, 350/800, EDW 81kg, 2K/2.5Ca, L AVF, no heparin  - no VDRA, ESA ordered but not yet given  Assessment/Plan: 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 Per [facility]. Severe aortic stenosis s/p TAVR 01/20/24: Zio monitor in place. ESRD: Dialyzed off schedule this week on Tues, then Wed -> next HD 9/6. HTN/volume: BP soft, on low dose BB and isosorbide . Below prior EDW - will lower on discharge. Anemia of ESRD: Hgb 9.7, s/p Aranesp  on 9/3 - monitor. Secondary HPTH: Ca/Phos ok - continue sevelamer  as binder. Nutrition: Alb low, continue supps. Hx cholecystitis with perc chole drain still in place: Erythema/purulence around  drain, on PO abx. S/p CT without abscess, and now s/p tube exchange. T2DM Dispo: In CIR.  Charmaine Piety, NP [facility] 02/06/2024,12:14 PM  LOS: 1 day

## 2024-02-06 NOTE — Evaluation (Signed)
 Physical Therapy Assessment and Plan  Patient Details  Name: Phillip [name] MRN: [id number] Date of Birth: April 10, 1956  PT Diagnosis: Difficulty walking, Impaired sensation, Muscle weakness, and Pain in joint Rehab Potential: Good ELOS: 10-14 days   Today's Date: 02/06/2024 PT Individual Time: 0840-1000 PT Individual Time Calculation (min): 80 min    Hospital Problem: Principal Problem:   Left below-knee amputee [facility])   Past Medical History:  Past Medical History:  Diagnosis Date   Abdominopelvic abscess (HCC) 09/29/2018   Abscess of appendix 09/22/2018   Acquired spondylolisthesis of lumbosacral region 03/20/2020   Acute cholecystitis 11/15/2023   Acute kidney injury (HCC) 10/07/2018   Acute kidney injury superimposed on chronic kidney disease (HCC) 11/06/2020   Acute kidney injury superimposed on CKD (HCC) 02/18/2020   Acute on chronic systolic (congestive) heart failure (HCC) 07/26/2015   Formatting of this note might be different from the original. Formatting of this note might be different from the original. Formatting of this note might be different from the original. revenkar EF 35% Formatting of this note might be different from the original. revenkar EF 35%     Acute on chronic systolic CHF (congestive heart failure) (HCC) 08/12/2023   Anasarca 07/01/2023   Anemia of chronic disease 08/18/2019   Anemia, chronic disease 08/18/2019   Aortic atherosclerosis (HCC)    Aortic stenosis    a.) TTE 09/15/2018: mild-mod (MPG 19); b.) TTE 03/21/2021: mild-mod (MPG 14.3); c.) TTE 04/24/2022: mild- mod (MPG 15)   Aortic stenosis, moderate 10/15/2019   Appendicitis 09/08/2018   ARF (acute renal failure) (HCC) 02/19/2020   Asthma    Atherosclerosis of native arteries of the extremities with ulceration (HCC) 02/24/2018   Formatting of this note might be different from the original.  Last Assessment & Plan:   His ABIs today are 1.07 on the right and 0.99 on the left with biphasic  waveforms.  Although these pressures may be somewhat elevated from medial calcification, his flow does appear to be reasonably good.  His left ABI was 0.58 prior to intervention.  At this point, we will stretch out his follow-up and see hi   Benign hypertension with CKD (chronic kidney disease) stage IV (HCC) 02/24/2018   Benign prostatic hyperplasia without lower urinary tract symptoms 01/14/2018   Bilateral lower extremity edema 11/04/2018   Bradycardia 03/25/2018   Bruit of right carotid artery 07/26/2015   CAD (coronary artery disease) 2003   a.) s/p 4v CABG 2003   Cardiac murmur 07/26/2015   Choledocholithiasis 11/12/2023   Chronic combined systolic and diastolic CHF (congestive heart failure) (HCC) 07/26/2015   a.) TTE 09/15/2018: EF 50-55%, mod LVH, RVE, BAE, mild-mod TR, AoV sclerosis, G1DD; b.) MPI 10/27/2019: EF <30%; c.) TTE 03/21/2021: EF 35-40%, post AK, inf HK, mod LVH, mod red RVSF, mod LAE, mod Aov sclerosis, G2DD; c.) TTE 04/24/2022: EF 35-40%, post AK, glok HK, mod LVE, mod red RVSF, mild-mod MR, Aov sclerosis, G3DD   Chronic idiopathic constipation 11/02/2018   Chronic low back pain without sciatica 12/02/2018   Chronic pain of right hip 03/31/2020   Chronic systolic heart failure (HCC) 07/26/2015   Formatting of this note might be different from the original. revenkar EF 35%     CKD (chronic kidney disease) stage 4, GFR 15-29 ml/min (HCC) 10/07/2018   Congestive heart failure (HCC) 07/26/2015   Last Assessment & Plan:   Poor cardiac function could certainly be contributing to poor blood supply to the feet and toes as well.  Formatting of this note might be different from the original.  Formatting of this note might be different from the original.  Last Assessment & Plan:   Poor cardiac function could certainly be contributing to poor blood supply to the feet and toes as well.     Last   Coronary artery disease    Diabetes mellitus without complication (HCC)    Dysphagia  07/26/2015   Encounter for long-term (current) use of insulin  (HCC) 09/27/2020   Erectile dysfunction 07/14/2017   ESRD on dialysis [facility]) 11/15/2023   Essential hypertension 02/24/2018   Fever 11/07/2020   Foot ulcer (HCC) 09/27/2020   left foot  Formatting of this note might be different from the original.  Formatting of this note might be different from the original.  left foot     Foot ulcer, left (HCC)    GERD (gastroesophageal reflux disease)    Heart murmur 07/26/2015   Hematuria 07/27/2015   High risk medication use 07/26/2015   History of marijuana use    Hyperkalemia 05/29/2020   Hyperlipidemia 07/26/2015   Hyperlipidemia associated with type 2 diabetes mellitus (HCC) 02/18/2020   Hypertension    Hypertension associated with diabetes (HCC)    Hypoglycemia 05/07/2019   Hypothyroidism 11/14/2015   Hypothyroidism (acquired) 11/14/2015   Insomnia 11/02/2018   Kidney insufficiency    Lumbar spondylosis 03/20/2020   Malaise and fatigue 03/25/2018   Malnutrition of moderate degree 08/20/2023   Mitral regurgitation 05/12/2019   Moderate aortic regurgitation 05/12/2019   Myocardial infarction due to demand ischemia (HCC) 09/26/2020   a.) Type II NSTEMI; b.) troponins were trended 0.54 --> 0.56 --> 0.52 ng/mL   Non-compliance 05/05/2019   NSTEMI (non-ST elevated myocardial infarction) (HCC) 09/27/2020   Osseous and subluxation stenosis of intervertebral foramina of lumbar region 03/20/2020   Osteoarthritis 10/06/2019   Pancytopenia (HCC) 08/12/2023   Peripheral vascular disease (HCC)    Personal history of tobacco use, presenting hazards to health 09/27/2020   Polyneuropathy in diabetes (HCC) 05/07/2019   Primary osteoarthritis involving multiple joints 10/06/2019   Primary osteoarthritis of right hip 02/21/2020   Pulmonary HTN (HCC)    a.) TTE 05/12/2019: PASP 71; b.) TTE 09/27/2020: PASP >70; c.) TTE 03/21/2021: RVSP 43; d.) TTE 04/24/2022: RVSP 80.6   Puncture wound of  right hip 04/02/2018   PVD (peripheral vascular disease) with claudication (HCC)    a.) s/p PTA 03/02/2018 - balloon angioplasty LEFT below knee popliteal artery; b.) s/p PTA 09/30/2022: baloon angioplasty LEFT tibioperoneal trunck, most proximal peroneal artery, and LEFT popliteal artery.   Regional wall motion abnormality of heart 05/07/2019   Right groin pain 02/18/2020   S/P CABG x 4 2003   S/P TAVR (transcatheter aortic valve replacement) 01/20/2024   TAVR with a 29 mm Edwards Sapien 3 Ultra Resilia THV via the TF by Dr. Wendel and Dr. Shyrl   Screening for colon cancer 02/18/2018   Sepsis (HCC) 11/15/2023   Sleep apnea 07/26/2015   a.) does not require nocturnal PAP therapy   Subacute osteomyelitis of left foot (HCC) 03/25/2018   Thrombocytopenia (HCC)    Type 2 diabetes mellitus with hyperlipidemia (HCC) 07/26/2015   Type 2 diabetes mellitus with stage 4 chronic kidney disease, with long-term current use of insulin  (HCC) 07/26/2015   Type II diabetes mellitus with neurological manifestations (HCC) 02/18/2020   Vitamin B12 deficiency 06/16/2019   Vitamin D deficiency 05/07/2018   Past Surgical History:  Past Surgical History:  Procedure Laterality Date  AMPUTATION Left 01/14/2024   Procedure: AMPUTATION, FOOT, PARTIAL;  Surgeon: Serene Gaile ORN, MD;  Location: [facility] OR;  Service: Vascular;  Laterality: Left;   AMPUTATION Left 01/30/2024   Procedure: AMPUTATION BELOW KNEE;  Surgeon: Serene Gaile ORN, MD;  Location: [facility] OR;  Service: Vascular;  Laterality: Left;   AMPUTATION TOE Left 03/13/2018   Procedure: AMPUTATION TOE-MPJ;  Surgeon: Ashley Soulier, DPM;  Location: [facility] ORS;  Service: Podiatry;  Laterality: Left;   ANGIOPLASTY     AV FISTULA PLACEMENT Left 08/20/2023   Procedure: ARTERIOVENOUS (AV) FISTULA CREATION;  Surgeon: Gretta Lonni PARAS, MD;  Location: [facility] OR;  Service: Vascular;  Laterality: Left;   CARDIAC CATHETERIZATION     CORONARY ARTERY BYPASS GRAFT N/A 2003    INTRAOPERATIVE TRANSTHORACIC ECHOCARDIOGRAM N/A 01/20/2024   Procedure: ECHOCARDIOGRAM, TRANSTHORACIC;  Surgeon: Wendel Lurena POUR, MD;  Location: [facility] INVASIVE [facility] LAB;  Service: Cardiovascular;  Laterality: N/A;   IR CATHETER TUBE CHANGE  12/31/2023   IR EXCHANGE BILIARY DRAIN  02/03/2024   IR FLUORO GUIDE CV LINE RIGHT  08/18/2023   IR PERC CHOLECYSTOSTOMY  11/12/2023   IR RADIOLOGIST EVAL & MGMT  12/31/2023   IR US  GUIDE VASC ACCESS RIGHT  08/18/2023   LAPAROSCOPIC APPENDECTOMY N/A 09/08/2018   Procedure: APPENDECTOMY LAPAROSCOPIC;  Surgeon: Jordis Laneta FALCON, MD;  Location: [facility] ORS;  Service: General;  Laterality: N/A;   LOWER EXTREMITY ANGIOGRAPHY Left 03/02/2018   Procedure: LOWER EXTREMITY ANGIOGRAPHY;  Surgeon: Marea Selinda RAMAN, MD;  Location: [facility] INVASIVE CV LAB;  Service: Cardiovascular;  Laterality: Left;   LOWER EXTREMITY ANGIOGRAPHY Left 09/30/2022   Procedure: Lower Extremity Angiography;  Surgeon: Marea Selinda RAMAN, MD;  Location: [facility] INVASIVE CV LAB;  Service: Cardiovascular;  Laterality: Left;   LOWER EXTREMITY ANGIOGRAPHY Left 12/26/2023   Procedure: Lower Extremity Angiography;  Surgeon: Marea Selinda RAMAN, MD;  Location: [facility] INVASIVE CV LAB;  Service: Cardiovascular;  Laterality: Left;   RIGHT/LEFT HEART CATH AND CORONARY/GRAFT ANGIOGRAPHY N/A 08/01/2023   Procedure: RIGHT/LEFT HEART CATH AND CORONARY/GRAFT ANGIOGRAPHY;  Surgeon: Wendel Lurena POUR, MD;  Location: [facility] INVAS[facility] CV LAB;  Service: Cardiovascular;  Laterality: N/A;   ROTATOR CUFF REPAIR Left    TRANSMETATARSAL AMPUTATION Left 01/16/2024   Procedure: AMPUTATION, FOOT, TRANSMETATARSAL;  Surgeon: Serene Gaile ORN, MD;  Location: [facility] OR;  Service: Vascular;  Laterality: Left;    Assessment & Plan Clinical Impression: Patient is a 68 y.o. year old  right-handed male with history significant for end-stage renal disease with hemodialysis Tuesday Thursday Saturday, hyperlipidemia, diabetes mellitus, hypertension, bradycardia, CAD with CABG  2003 maintained on aspirin  as well as Plavix ., recent acute cholecystitis June 2025 requiring cholecystostomy tube placement, anemia of chronic disease, BPH, chronic diastolic congestive heart failure, aortic stenosis (TAVR postponed due to left third toe gangrene), PAD status post PCI to left tibia fibula/angioplasty 7/25 as well as left toe amputation 2019 . Per chart review patient lives with spouse. 1 level home 2 steps to enter. Ambulates with assist device. Independent with ADLs. Presented 01/14/2024 for elective left third toe amputation due to nonhealing ulcer with gangrenous changes. Noninvasive vascular study showed noncompressible vessels hence underwent an angiogram of left lower extremity with transluminal angioplasty/stent placement to the left tibial peroneal trunk on 7/25 at [facility]. Underwent left third toe amputation including metatarsal head 01/14/2024 per Dr. Serene. [facility] course nephrology consulted with dialysis ongoing as directed. Due to nonhealing left toe wound underwent left transmetatarsal amputation 01/16/2024 and wound VAC applied and removed 01/23/2024. Follow-up cardiology  services for severe aortic stenosis and recent delay in TAVR procedure due to left toe gangrene and was cleared for surgery undergoing TAVR 01/20/2024 per Dr. Shyrl. Close monitoring of recent left TMA with poor healing and limb was not felt to be salvageable undergoing left below-knee amputation 01/30/2024 per Dr. Serene and limb guard applied. Acute on chronic anemia latest hemoglobin 7.9. Patient remains on aspirin  and Plavix  as prior to admission. SQ heparin  added for DVT prophylaxis. Radiology service follow-up for percutaneous cholecystostomy tube from recent acute cholecystitis June 2025 and currently remains on Augmentin  and linezolid  for 3 to 4 weeks. Patient has remained afebrile with no leukocytosis some erythema around the percutaneous biliary drain site with some purulent drainage soaking the gauze. CT  of the abdomen pelvis showed some soft tissue thickening and stranding with minimal fluid seen adjacent to the portion of the tubing. No peri-cholecystic collection and awaiting plan to assess for continued suction drainage versus change to gravity bag. Therapy evaluations completed due to patient decreased functional mobility was admitted for a comprehensive rehab program   Patient transferred to CIR on 02/05/2024 .   Patient currently requires mod to max A with mobility secondary to muscle weakness and muscle joint tightness, decreased cardiorespiratoy endurance, and decreased sitting balance, decreased standing balance, decreased postural control, and decreased balance strategies.  Prior to hospitalization, patient was modified independent  with mobility and lived with Spouse in a House home.  Home access is 2Stairs to enter.  Patient will benefit from skilled PT intervention to maximize safe functional mobility, minimize fall risk, and decrease caregiver burden for planned discharge home with 24 hour supervision.  Anticipate patient will benefit from follow up HH at discharge.  PT - End of Session Activity Tolerance: Decreased this session Endurance Deficit: Yes Endurance Deficit Description: fatigue PT Assessment Rehab Potential (ACUTE/IP ONLY): Good PT Barriers to Discharge: Home environment access/layout;Hemodialysis PT Patient demonstrates impairments in the following area(s): Balance;Edema;Endurance;Motor;Pain;Safety;Sensory;Skin Integrity PT Transfers Functional Problem(s): Bed to Chair;Bed Mobility;Car;Furniture PT Locomotion Functional Problem(s): Ambulation;Wheelchair Mobility;Stairs PT Plan PT Intensity: Minimum of 1-2 x/day ,45 to 90 minutes PT Frequency: 5 out of 7 days PT Duration Estimated Length of Stay: 10-14 days PT Treatment/Interventions: Ambulation/gait training;Balance/vestibular training;Community reintegration;Discharge planning;Disease management/prevention;DME/adaptive  equipment instruction;Functional mobility training;Neuromuscular re-education;Pain management;Patient/family education;Psychosocial support;Skin care/wound management;Splinting/orthotics;Stair training;Therapeutic Activities;Therapeutic Exercise;UE/LE Strength taining/ROM;UE/LE Coordination activities;Wheelchair propulsion/positioning PT Transfers Anticipated Outcome(s): supervision basic transfers, min A car PT Locomotion Anticipated Outcome(s): mod I w/c mobility, min A stairs PT Recommendation Recommendations for Other Services: Neuropsych consult;Therapeutic Recreation consult Therapeutic Recreation Interventions: Stress management;Other (comment) (coping) Follow Up Recommendations: Home health PT Patient destination: Home Equipment Recommended: Wheelchair (measurements);Wheelchair cushion (measurements) Equipment Details: pt has SPC and RW at home per report   PT Evaluation Precautions/Restrictions Precautions Precautions: Fall Recall of Precautions/Restrictions: Intact Precaution/Restrictions Comments: R chole drain Required Braces or Orthoses: Other Brace Other Brace: limb guard Restrictions Weight Bearing Restrictions Per Provider Order: No LLE Weight Bearing Per Provider Order: Non weight bearing  Pain Does not rate, but requests pain medication for LLE pain and limits mobility throughout session. Pain Interference Pain Interference Pain Effect on Sleep: 2. Occasionally Pain Interference with Therapy Activities: 2. Occasionally Pain Interference with Day-to-Day Activities: 2. Occasionally Home Living/Prior Functioning Home Living Available Help at Discharge: Family;Available 24 hours/day Type of Home: House Home Access: Stairs to enter [employer] of Steps: 2 Entrance Stairs-Rails: Right;Left;Can reach both Home Layout: One level Bathroom Shower/Tub: Health visitor: Handicapped height Bathroom Accessibility: Yes  Lives  With: Spouse Prior  Function Level of Independence: Requires assistive device for independence;Independent with basic ADLs;Independent with gait;Independent with transfers  Able to Take Stairs?: Yes Driving: Yes Vocation: Retired Leisure: Hobbies-yes (Comment) Vision/Perception  Vision - History Baseline Vision: Wears glasses only for reading Ability to See in Adequate Light: 0 Adequate Perception Perception: Within Functional Limits Praxis Praxis: WFL  Cognition Overall Cognitive Status: WFL Awareness: Appears intact Problem Solving: Appears intact Safety/Judgment: Appears intact Sensation Sensation Light Touch: Appears Intact Hot/Cold: Not tested Proprioception: Appears Intact Stereognosis: Not tested Coordination Gross Motor Movements are Fluid and Coordinated: Yes (pain limiting) Fine Motor Movements are Fluid and Coordinated: Yes Motor  Motor Motor: Other (comment) Motor - Skilled Clinical Observations: pain limiting mobility and generalized weakness   Trunk/Postural Assessment  Cervical Assessment Cervical Assessment: Exceptions to [facility] (forward head) Thoracic Assessment Thoracic Assessment: Exceptions to [facility] (flexed posture) Lumbar Assessment Lumbar Assessment: Exceptions to [facility] (posterior tilt) Postural Control Postural Control: Deficits on evaluation  Balance Balance Balance Assessed: Yes Static Sitting Balance Static Sitting - Level of Assistance: 5: Stand by assistance Dynamic Sitting Balance Dynamic Sitting - Level of Assistance: 5: Stand by assistance;4: Min assist Static Standing Balance Static Standing - Level of Assistance: 2: Max assist Dynamic Standing Balance Dynamic Standing - Level of Assistance: Not tested (comment) Extremity Assessment      RLE Assessment RLE Assessment: Exceptions to [facility] General Strength Comments: decreased muscular endurance, grossly 4/5 LLE Assessment LLE Assessment: Exceptions to [facility] Active Range of Motion (AROM) Comments: lacking  full extension but difficult to fully assess due to pain General Strength Comments: grossly 3-/5, pain limiting  Care Tool Care Tool Bed Mobility Roll left and right activity   Roll left and right assist level: Contact Guard/Touching assist    Sit to lying activity   Sit to lying assist level: Contact Guard/Touching assist    Lying to sitting on side of bed activity   Lying to sitting on side of bed assist level: the ability to move from lying on the back to sitting on the side of the bed with no back support.: Contact Guard/Touching assist     Care Tool Transfers Sit to stand transfer   Sit to stand assist level: Maximal Assistance - Patient 25 - 49%    Chair/bed transfer   Chair/bed transfer assist level: Moderate Assistance - Patient 50 - 74%    Car transfer   Car transfer assist level: Moderate Assistance - Patient 50 - 74%      Care Tool Locomotion Ambulation Ambulation activity did not occur: Safety/medical concerns        Walk 10 feet activity Walk 10 feet activity did not occur: Safety/medical concerns       Walk 50 feet with 2 turns activity Walk 50 feet with 2 turns activity did not occur: Safety/medical concerns      Walk 150 feet activity Walk 150 feet activity did not occur: Safety/medical concerns      Walk 10 feet on uneven surfaces activity Walk 10 feet on uneven surfaces activity did not occur: Safety/medical concerns      Stairs Stair activity did not occur: Safety/medical concerns        Walk up/down 1 step activity Walk up/down 1 step or curb (drop down) activity did not occur: Safety/medical concerns      Walk up/down 4 steps activity Walk up/down 4 steps activity did not occur: Safety/medical concerns      Walk up/down 12 steps activity Walk up/down  12 steps activity did not occur: Safety/medical concerns      Pick up small objects from floor Pick up small object from the floor (from standing position) activity did not occur:  Safety/medical concerns      Wheelchair Is the patient using a wheelchair?: Yes Type of Wheelchair: Manual   Wheelchair assist level: Supervision/Verbal cueing Max wheelchair distance: 150'  Wheel 50 feet with 2 turns activity   Assist Level: Supervision/Verbal cueing  Wheel 150 feet activity   Assist Level: Supervision/Verbal cueing    Refer to Care Plan for Long Term Goals  SHORT TERM GOAL WEEK 1 PT Short Term Goal 1 (Week 1): Pt will perform basic transfers with min A PT Short Term Goal 2 (Week 1): Pt will be able to perform sit <> stands with min A PT Short Term Goal 3 (Week 1): Pt will be able to initiate gait  Recommendations for other services: Neuropsych and Adult nurse group, Stress management, and Other coping  Skilled Therapeutic Intervention  Evaluation completed (see details above and below) with education on PT POC and goals and individual treatment initiated with focus on functional transfers, sit <> stands, and wc mobility. Pt comes to EOB with CGA and extra time due to pain in LLE. Dressed EOB with assist for lower body dressing, supervision for shirt. Sit > stand attempted but unable to get all the way up to pull up pants with max A so worked on lateral leans to pull up and then partial sit > stand and PT pulled up the rest of the way. Min to mod A for scoot/squat pivot to w/c with cues for technique and extra time due to pain. W/c propulsion for functional mobility training and generalized strengthening. Distance limited due to endurance. Simulated car transfer with mod A overall for squat pivot with use of handle for support and cues for technique. Sit to stands attempted with various hand placements but still requires up to max A and difficult to get full upright position.  Mobility Bed Mobility Bed Mobility: Rolling Right;Supine to Sit;Sit to Supine Rolling Right: Contact Guard/Touching assist Supine to Sit: Contact Guard/Touching assist Sit  to Supine: Contact Guard/Touching assist Transfers Transfers: Sit to Stand Sit to Stand: Maximal Assistance - Patient 25-49% Transfer (Assistive device): Rolling walker (difficulty reaching full upright posture) Locomotion  Stairs / Additional Locomotion Stairs: No Wheelchair Mobility Wheelchair Mobility: Yes Wheelchair Assistance: Doctor, general practice: Both upper extremities Wheelchair Parts Management: Needs assistance Distance: 150'   Discharge Criteria: Patient will be discharged from PT if patient refuses treatment 3 consecutive times without medical reason, if treatment goals not met, if there is a change in medical status, if patient makes no progress towards goals or if patient is discharged from hospital.  The above assessment, treatment plan, treatment alternatives and goals were discussed and mutually agreed upon: by patient  Elnor Donald Sherrell Donald WENDI Elnor, PT, DPT, CBIS  02/06/2024, 12:34 PM

## 2024-02-07 DIAGNOSIS — G546 Phantom limb syndrome with pain: Secondary | ICD-10-CM | POA: Diagnosis not present

## 2024-02-07 DIAGNOSIS — Z89512 Acquired absence of left leg below knee: Secondary | ICD-10-CM | POA: Diagnosis not present

## 2024-02-07 DIAGNOSIS — I739 Peripheral vascular disease, unspecified: Secondary | ICD-10-CM | POA: Diagnosis not present

## 2024-02-07 DIAGNOSIS — E43 Unspecified severe protein-calorie malnutrition: Secondary | ICD-10-CM

## 2024-02-07 DIAGNOSIS — N186 End stage renal disease: Secondary | ICD-10-CM | POA: Diagnosis not present

## 2024-02-07 HISTORY — DX: Unspecified severe protein-calorie malnutrition: E43

## 2024-02-07 MED ORDER — OXYCODONE HCL 5 MG PO TABS
ORAL_TABLET | ORAL | Status: AC
  Filled 2024-02-07: qty 2

## 2024-02-07 MED ORDER — METHOCARBAMOL 500 MG PO TABS
500.0000 mg | ORAL_TABLET | Freq: Three times a day (TID) | ORAL | Status: DC | PRN
  Administered 2024-02-07 – 2024-02-23 (×17): 500 mg via ORAL
  Filled 2024-02-07 (×17): qty 1

## 2024-02-07 MED ORDER — GABAPENTIN 100 MG PO CAPS
200.0000 mg | ORAL_CAPSULE | Freq: Every day | ORAL | Status: DC
  Administered 2024-02-07 – 2024-02-22 (×16): 200 mg via ORAL
  Filled 2024-02-07 (×16): qty 2

## 2024-02-07 NOTE — IPOC Note (Signed)
 Overall Plan of Care [facility]) Patient Details Name: ASEEL UHDE MRN: [id number] DOB: 06-Feb-1956  Admitting Diagnosis: Left below-knee amputee [facility])  Hospital Problems: Principal Problem:   Left below-knee amputee [facility]) Active Problems:   Protein-calorie malnutrition, severe     Functional Problem List: Nursing Bladder, Bowel, Edema, Endurance, Medication Management, Motor, Pain, Safety, Skin Integrity  PT Balance, Edema, Endurance, Motor, Pain, Safety, Sensory, Skin Integrity  OT Cognition, Endurance, Motor, Safety, Skin Integrity, Balance  SLP    TR         Basic ADL's: OT Grooming, Bathing, Dressing, Toileting     Advanced  ADL's: OT       Transfers: PT Bed to Chair, Bed Mobility, Car, Oncologist: PT Ambulation, Psychologist, prison and probation services, Stairs     Additional Impairments: OT None  SLP        TR      Anticipated Outcomes Item Anticipated Outcome  Self Feeding Supervision  Dispensing optician Transfers Supervision  Bowel/Bladder  manage bowels winth prn medications/ patient is anuric  Transfers  supervision basic transfers, min A car  Locomotion  mod I w/c mobility, min A stairs  Communication     Cognition     Pain  <4 w/ prns  Safety/Judgment  Manage safety with mod I assistance   Therapy Plan: PT Intensity: Minimum of 1-2 x/day ,45 to 90 minutes PT Frequency: 5 out of 7 days PT Duration Estimated Length of Stay: 10-14 days OT Intensity: Minimum of 1-2 x/day, 45 to 90 minutes OT Frequency: 5 out of 7 days OT Duration/Estimated Length of Stay: 10-12 days     Team Interventions: Nursing Interventions Patient/Family Education, Medication Management, Bladder Management, Bowel Management, Disease Management/Prevention, Pain Management, Discharge Planning, Skin Care/Wound Management  PT interventions Ambulation/gait training, Warden/ranger,  Community reintegration, Discharge planning, Disease management/prevention, DME/adaptive equipment instruction, Functional mobility training, Neuromuscular re-education, Pain management, Patient/family education, Psychosocial support, Skin care/wound management, Splinting/orthotics, Stair training, Therapeutic Activities, Therapeutic Exercise, UE/LE Strength taining/ROM, UE/LE Coordination activities, Wheelchair propulsion/positioning  OT Interventions Discharge planning, Balance/vestibular training, Pain management, Self Care/advanced ADL retraining, Therapeutic Activities, Functional mobility training, Disease mangement/prevention, Cognitive remediation/compensation, UE/LE Coordination activities, Skin care/wound managment, Therapeutic Exercise, DME/adaptive equipment instruction, Neuromuscular re-education, Psychosocial support, UE/LE Strength taining/ROM, Wheelchair propulsion/positioning, Patient/family education  SLP Interventions    TR Interventions    SW/CM Interventions Discharge Planning, Psychosocial Support, Patient/Family Education, Disease Management/Prevention   Barriers to Discharge MD  Medical stability  Nursing Decreased caregiver support, Home environment access/layout, Hemodialysis, Weight bearing restrictions Discharge: House  Discharge Home Layout: One level  Discharge Home Access: Stairs to enter  Entrance Stairs-Rails: Right, Left, Can reach both  Entrance Stairs-Number of Steps: 2  PT Home environment access/layout, Hemodialysis    OT Wound Care, Hemodialysis, Home environment access/layout    SLP      SW       Team Discharge Planning: Destination: PT-Home ,OT- Home , SLP-  Projected Follow-up: PT-Home health PT, OT-  Home health OT, SLP-  Projected Equipment Needs: PT-Wheelchair (measurements), Wheelchair cushion (measurements), OT- To be determined, SLP-  Equipment Details: PT-pt has SPC and RW at home per report, OT-  Patient/family involved in discharge planning:  PT- Patient,  OT-Patient, Family member/caregiver, SLP-   MD ELOS: 10-14 Medical Rehab Prognosis:  Excellent Assessment: The patient has been admitted for CIR therapies with the diagnosis of left  BKA . The team will be addressing functional mobility, strength, stamina, balance, safety, adaptive techniques and equipment, self-care, bowel and bladder mgt, patient and caregiver education. Goals have been set at Sup. Anticipated discharge destination is home.        See Team Conference Notes for weekly updates to the plan of care

## 2024-02-07 NOTE — Progress Notes (Addendum)
  Received patient in bed to unit.   Informed consent signed and in chart.    TX duration: 2 hours, full treatment 3.5 hours     Transported by  Hand-off given to patient's nurse.  yes   Access used: fistula Access issues: Infiltrated   Total UF removed: 0 Medication(s) given: Pain meds Post HD VS:  109/60     Hunter Hacking LPN [facility]

## 2024-02-07 NOTE — Progress Notes (Signed)
 PROGRESS NOTE   Subjective/Complaints: Still having leg and phantom pain. Says he's not being attended to. Staff concerned that he might be more confused.   ROS: Limited due to cognitive/behavioral   Objective:   CT ABDOMEN PELVIS WO CONTRAST Result Date: 02/05/2024 CLINICAL DATA:  Cholecystostomy tube not flushing. Evaluate for placement location. EXAM: CT ABDOMEN AND PELVIS WITHOUT CONTRAST TECHNIQUE: Multidetector CT imaging of the abdomen and pelvis was performed following the standard protocol without IV contrast. RADIATION DOSE REDUCTION: This exam was performed according to the departmental dose-optimization program which includes automated exposure control, adjustment of the mA and/or kV according to patient size and/or use of iterative reconstruction technique. COMPARISON:  CT abdomen pelvis dated 02/02/2024. FINDINGS: Evaluation of this exam is limited in the absence of intravenous contrast. Lower chest: No acute abnormality. No intra-abdominal free air or free fluid. Hepatobiliary: The liver is unremarkable. Percutaneous cholecystostomy with pigtail tip in the gallbladder lumen in similar position as the prior CT. The gallbladder is filled with stones. There is thickened appearance of the gallbladder wall. Pancreas: Unremarkable. No pancreatic ductal dilatation or surrounding inflammatory changes. Spleen: Normal in size without focal abnormality. Adrenals/Urinary Tract: The adrenal glands are unremarkable. Mild right pelvicaliectasis. Excreted contrast noted in the renal collecting systems and urinary bladder. Stomach/Bowel: Dense oral contrast noted throughout the colon. There is no bowel obstruction or active inflammation. Appendectomy. Vascular/Lymphatic: Mild aortoiliac atherosclerotic disease. The IVC is unremarkable. No portal gas. There is no adenopathy. Reproductive: The prostate and seminal vesicles are grossly unremarkable Other:  None Musculoskeletal: Degenerative changes of the spine. Bilateral L5 pars defects with grade 1 anterolisthesis. No acute osseous pathology. IMPRESSION: 1. Percutaneous cholecystostomy with pigtail tip in the gallbladder lumen in similar position as the prior CT. 2. Cholelithiasis. 3. No bowel obstruction. 4.  Aortic Atherosclerosis (ICD10-I70.0). Electronically Signed   By: Vanetta Chou M.D.   On: 02/05/2024 20:04   Recent Labs    02/05/24 0650 02/05/24 1936  WBC 9.0 9.0  HGB 9.7* 9.4*  HCT 31.3* 31.6*  PLT 156 168   Recent Labs    02/05/24 0650 02/05/24 1936  NA 134* 133*  K 4.4 4.4  CL 91* 89*  CO2 28 28  GLUCOSE 118* 155*  BUN 18 25*  CREATININE 4.24* 5.44*  CALCIUM  8.8* 8.9    Intake/Output Summary (Last 24 hours) at 02/07/2024 1019 Last data filed at 02/07/2024 0900 Gross per 24 hour  Intake 0 ml  Output --  Net 0 ml        Physical Exam: Vital Signs Blood pressure 120/68, pulse 85, temperature 97.7 F (36.5 C), temperature source Oral, resp. rate 17, height 6' (1.829 m), weight 69.9 kg, SpO2 100%.  Constitutional: No distress . Vital signs reviewed. HEENT: NCAT, EOMI, oral membranes moist Neck: supple Cardiovascular: RRR without murmur. No JVD    Respiratory/Chest: CTA Bilaterally without wheezes or rales. Normal effort    GI/Abdomen: BS +, non-tender, non-distended Ext: no clubbing, cyanosis, or edema Psych:anxious, cooperative  Skin: Left aVF, left BK with dressing/shrinker in place Neuro: Alert and oriented to person, place, reasons, month/year. Does appear a bit confused compared to when I saw him a  couple days ago, follows commands.  Cranial nerves II through XII grossly intact. Bilateral upper extremity strength 5 out of 5. Right lower extremity strength 4 out of 5 proximal, 5 out of 5 distal. Left lower extremity at least 3-5 Sensation intact light touch in all extremities  Assessment/Plan: 1. Functional deficits which require 3+ hours per day of  interdisciplinary therapy in a comprehensive inpatient rehab setting. Physiatrist is providing close team supervision and 24 hour management of active medical problems listed below. Physiatrist and rehab team continue to assess barriers to discharge/monitor patient progress toward functional and medical goals  Care Tool:  Bathing    Body parts bathed by patient: Right arm, Left arm, Chest, Face, Abdomen, Front perineal area, Right upper leg   Body parts bathed by helper: Buttocks Body parts n/a: Left lower leg   Bathing assist Assist Level: Moderate Assistance - Patient 50 - 74%     Upper Body Dressing/Undressing Upper body dressing   What is the patient wearing?: Pull over shirt    Upper body assist Assist Level: Minimal Assistance - Patient > 75%    Lower Body Dressing/Undressing Lower body dressing      What is the patient wearing?: Pants, Ace wrap/stump shrinker     Lower body assist Assist for lower body dressing: Maximal Assistance - Patient 25 - 49%     Toileting Toileting    Toileting assist Assist for toileting: Maximal Assistance - Patient 25 - 49%     Transfers Chair/bed transfer  Transfers assist     Chair/bed transfer assist level: Moderate Assistance - Patient 50 - 74%     Locomotion Ambulation   Ambulation assist   Ambulation activity did not occur: Safety/medical concerns          Walk 10 feet activity   Assist  Walk 10 feet activity did not occur: Safety/medical concerns        Walk 50 feet activity   Assist Walk 50 feet with 2 turns activity did not occur: Safety/medical concerns         Walk 150 feet activity   Assist Walk 150 feet activity did not occur: Safety/medical concerns         Walk 10 feet on uneven surface  activity   Assist Walk 10 feet on uneven surfaces activity did not occur: Safety/medical concerns         Wheelchair     Assist Is the patient using a wheelchair?: Yes Type of  Wheelchair: Manual    Wheelchair assist level: Supervision/Verbal cueing Max wheelchair distance: 150'    Wheelchair 50 feet with 2 turns activity    Assist        Assist Level: Supervision/Verbal cueing   Wheelchair 150 feet activity     Assist      Assist Level: Supervision/Verbal cueing   Blood pressure 120/68, pulse 85, temperature 97.7 F (36.5 C), temperature source Oral, resp. rate 17, height 6' (1.829 m), weight 69.9 kg, SpO2 100%.  Medical Problem List and Plan: 1. Functional deficits secondary to left BKA 01/30/2024 after failed TMA with history of PAD status post PCI to left tibia/fibula angioplasty              -patient may  shower             -ELOS/Goals: 7 days with mod I goals for mobility and self-care  -Continue CIR therapies including PT, OT  2.  Antithrombotics: -DVT/anticoagulation:  SQ heparin              -  antiplatelet therapy: Aspirin  81 mg daily, Plavix  75 mg daily 3. Pain Management: Oxycodone  as needed             - pt appears more confused. Was recently changed to lyrica . Will dc  -change back to gabapentin . Only needs to be given once per day in evening.  4. Mood/Behavior/Sleep: Provide emotional support             -antipsychotic agents: N/A  - 9/5 melatonin as needed insomnia 5. Neuropsych/cognition: This patient is capable of making decisions on his own behalf. 6. Skin/Wound Care: Routine skin checks 7. Fluids/Electrolytes/Nutrition: Routine in and out with follow-up chemistries 8.  Aortic stenosis.  Status post TAVR 01/20/2024 9.  End-stage renal disease.  Continue hemodialysis as per renal services             -HD at end of day to allow participation in therapy  - HD T TH S 10.  Hyperlipidemia.  Lipitor 11.  Hypertension.  Coreg  3.125 mg twice daily, Imdur  120 mg daily.  Monitor with increased mobility    02/07/2024    5:55 AM 02/06/2024    8:15 PM 02/06/2024    1:25 PM  Vitals with BMI  Systolic 120 123 897  Diastolic 68 69 56   Pulse 85 78 85  9/6 controlled at present 12.  Hypothyroidism.  Synthroid  13.  Acute on chronic anemia.  Continue Aranesp .  Follow-up CBC  - 9/5 hemoglobin stable at 9.4 14.  CAD with history of CABG.  No chest pain or shortness of breath.  Continue Imdur  15.  BPH.  Flomax  0.4 mg daily. 16.  Chronic diastolic congestive heart failure.  Monitor for any signs of fluid overload.  Follow-up per cardiology services.             -daily weights             -volume mgt with HD Filed Weights   02/05/24 1829 02/06/24 0431  Weight: 68.3 kg 69.9 kg    17.  Recent acute cholecystitis June 2025.  Currently maintained on Augmentin  and linezolid  for 3 to 4 weeks and will confirm duration of antibiotic.  Radiology following  for percutaneous cholecystostomy tube for recent cholecystitis. Awaiting plan for continued suction drainage versus change to gravity bag.     LOS: 2 days A FACE TO FACE EVALUATION WAS PERFORMED  Arthea ONEIDA Gunther 02/07/2024, 10:19 AM

## 2024-02-07 NOTE — Progress Notes (Signed)
 Patients arterial needle became infiltrated, Unable to reposition needle or re stick him. Provider aware stopped treatment blood rinsed back

## 2024-02-07 NOTE — Progress Notes (Signed)
  [facility] Progress Note   Subjective:    Seen and examined patient at bedside. No issues. Plan for HD this afternoon.  Objective Vitals:   02/07/24 1430 02/07/24 1500 02/07/24 1512 02/07/24 1519  BP: (!) 106/55 (!) 87/52 107/67 109/60  Pulse: 89 87 85 84  Resp: 15 14 15 18   Temp:    97.7 F (36.5 C)  TempSrc:      SpO2: 100% 96% 100% 98%  Weight:    69 kg  Height:       Physical Exam General: Well appearing man, NAD Heart: RRR Lungs: CTAB Abdomen: soft, R perc chole tube in place, bloody drainage in bulb today Extremities: No edema, L BKA bandaged Dialysis Access: LUE AVF +t/b  Filed Weights   02/05/24 1829 02/06/24 0431 02/07/24 1519  Weight: 68.3 kg 69.9 kg 69 kg    Intake/Output Summary (Last 24 hours) at 02/07/2024 1608 Last data filed at 02/07/2024 1519 Gross per 24 hour  Intake 20 ml  Output 0 ml  Net 20 ml    Additional Objective Labs: Basic Metabolic Panel: Recent Labs  Lab 02/03/24 1630 02/05/24 0650 02/05/24 1936  NA 128* 134* 133*  K 4.6 4.4 4.4  CL 89* 91* 89*  CO2 22 28 28   GLUCOSE 137* 118* 155*  BUN 62* 18 25*  CREATININE 9.08* 4.24* 5.44*  CALCIUM  8.1* 8.8* 8.9  PHOS 4.4  --  4.5   Liver Function Tests: Recent Labs  Lab 02/03/24 1630 02/05/24 1936  ALBUMIN  2.1* 2.4*   No results for input(s): LIPASE, AMYLASE in the last 168 hours. CBC: Recent Labs  Lab 02/01/24 0342 02/03/24 1630 02/05/24 0650 02/05/24 1936  WBC 10.5 8.9 9.0 9.0  NEUTROABS  --   --  6.9  --   HGB 8.8* 7.9* 9.7* 9.4*  HCT 27.9* 25.7* 31.3* 31.6*  MCV 95.9 96.3 96.3 96.6  PLT 151 144* 156 168   Blood Culture    Component Value Date/Time   SDES BILE 11/12/2023 1247   SPECREQUEST NONE 11/12/2023 1247   CULT MODERATE STREPTOCOCCUS SALIVARIUS 11/12/2023 1247   REPTSTATUS 11/14/2023 FINAL 11/12/2023 1247    Cardiac Enzymes: No results for input(s): CKTOTAL, CKMB, CKMBINDEX, TROPONINI in the last 168 hours. CBG: Recent Labs   Lab 02/04/24 0631 02/04/24 2110 02/05/24 0559 02/05/24 1126 02/05/24 1706  GLUCAP 100* 121* 108* 134* 124*   Iron  Studies: No results for input(s): IRON , TIBC, TRANSFERRIN, FERRITIN in the last 72 hours. Lab Results  Component Value Date   INR 1.1 01/19/2024   INR 1.3 (H) 11/12/2023   INR 1.4 (H) 11/11/2023   Studies/Results: CT ABDOMEN PELVIS WO CONTRAST Result Date: 02/05/2024 CLINICAL DATA:  Cholecystostomy tube not flushing. Evaluate for placement location. EXAM: CT ABDOMEN AND PELVIS WITHOUT CONTRAST TECHNIQUE: Multidetector CT imaging of the abdomen and pelvis was performed following the standard protocol without IV contrast. RADIATION DOSE REDUCTION: This exam was performed according to the departmental dose-optimization program which includes automated exposure control, adjustment of the mA and/or kV according to patient size and/or use of iterative reconstruction technique. COMPARISON:  CT abdomen pelvis dated 02/02/2024. FINDINGS: Evaluation of this exam is limited in the absence of intravenous contrast. Lower chest: No acute abnormality. No intra-abdominal free air or free fluid. Hepatobiliary: The liver is unremarkable. Percutaneous cholecystostomy with pigtail tip in the gallbladder lumen in similar position as the prior CT. The gallbladder is filled with stones. There is thickened appearance of the gallbladder wall. Pancreas: Unremarkable. No  pancreatic ductal dilatation or surrounding inflammatory changes. Spleen: Normal in size without focal abnormality. Adrenals/Urinary Tract: The adrenal glands are unremarkable. Mild right pelvicaliectasis. Excreted contrast noted in the renal collecting systems and urinary bladder. Stomach/Bowel: Dense oral contrast noted throughout the colon. There is no bowel obstruction or active inflammation. Appendectomy. Vascular/Lymphatic: Mild aortoiliac atherosclerotic disease. The IVC is unremarkable. No portal gas. There is no adenopathy.  Reproductive: The prostate and seminal vesicles are grossly unremarkable Other: None Musculoskeletal: Degenerative changes of the spine. Bilateral L5 pars defects with grade 1 anterolisthesis. No acute osseous pathology. IMPRESSION: 1. Percutaneous cholecystostomy with pigtail tip in the gallbladder lumen in similar position as the prior CT. 2. Cholelithiasis. 3. No bowel obstruction. 4.  Aortic Atherosclerosis (ICD10-I70.0). Electronically Signed   By: Vanetta Chou M.D.   On: 02/05/2024 20:04    Medications:  anticoagulant sodium citrate       (feeding supplement) PROSource Plus  30 mL Oral BID BM   amoxicillin -clavulanate  1 tablet Oral QHS   aspirin  EC  81 mg Oral Daily   atorvastatin   20 mg Oral Daily   carvedilol   3.125 mg Oral BID   Chlorhexidine  Gluconate Cloth  6 each Topical Q0600   clopidogrel   75 mg Oral Daily   feeding supplement (NEPRO CARB STEADY)  237 mL Oral TID BM   gabapentin   200 mg Oral QHS   heparin  injection (subcutaneous)  5,000 Units Subcutaneous Q8H   isosorbide  mononitrate  120 mg Oral Daily   latanoprost   1 drop Both Eyes QHS   levothyroxine   50 mcg Oral Daily   linezolid   600 mg Oral Q12H   multivitamin  1 tablet Oral QHS   nutrition supplement (JUVEN)  1 packet Oral BID BM   sevelamer  carbonate  800 mg Oral TID WC   tamsulosin   0.4 mg Oral QPC breakfast    Dialysis Orders: TTS [facility] 4hr, 350/800, EDW 81kg, 2K/2.5Ca, L AVF, no heparin  - no VDRA, ESA ordered but not yet given  Assessment/Plan: 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 Per [facility]. Severe aortic stenosis s/p TAVR 01/20/24: Zio monitor in place. ESRD: Dialyzed off schedule this week on Tues, then Wed -> next HD this afternoon. HTN/volume: BP soft, on low dose BB and isosorbide . Below prior EDW - will lower on discharge. Anemia of ESRD: Hgb 9.7, s/p Aranesp  on 9/3 - monitor. Secondary HPTH: Ca/Phos ok - continue sevelamer  as  binder. Nutrition: Alb low, continue supps. Hx cholecystitis with perc chole drain still in place: Erythema/purulence around drain, on PO abx. S/p CT without abscess, and now s/p tube exchange. T2DM Dispo: In CIR.  ADDENDUM 02/07/24 at 3:30pm: Informed by HD RN of patient's AVF infiltrated while on HD, thus, treatment ended early. Plan to order a F'gram to be done in IR on Monday 9/8. Charmaine Piety, NP  Charmaine Piety, NP [facility] 02/07/2024,4:08 PM  LOS: 2 days

## 2024-02-08 DIAGNOSIS — N186 End stage renal disease: Secondary | ICD-10-CM | POA: Diagnosis not present

## 2024-02-08 DIAGNOSIS — Z89512 Acquired absence of left leg below knee: Secondary | ICD-10-CM | POA: Diagnosis not present

## 2024-02-08 DIAGNOSIS — G546 Phantom limb syndrome with pain: Secondary | ICD-10-CM | POA: Diagnosis not present

## 2024-02-08 DIAGNOSIS — I739 Peripheral vascular disease, unspecified: Secondary | ICD-10-CM | POA: Diagnosis not present

## 2024-02-08 LAB — RENAL FUNCTION PANEL
Albumin: 2.2 g/dL — ABNORMAL LOW (ref 3.5–5.0)
Anion gap: 14 (ref 5–15)
BUN: 41 mg/dL — ABNORMAL HIGH (ref 8–23)
CO2: 27 mmol/L (ref 22–32)
Calcium: 8.3 mg/dL — ABNORMAL LOW (ref 8.9–10.3)
Chloride: 90 mmol/L — ABNORMAL LOW (ref 98–111)
Creatinine, Ser: 6.6 mg/dL — ABNORMAL HIGH (ref 0.61–1.24)
GFR, Estimated: 9 mL/min — ABNORMAL LOW (ref >60)
Glucose, Bld: 97 mg/dL (ref 70–99)
Phosphorus: 4.6 mg/dL (ref 2.5–4.6)
Potassium: 5 mmol/L (ref 3.5–5.1)
Sodium: 131 mmol/L — ABNORMAL LOW (ref 135–145)

## 2024-02-08 LAB — CBC WITH DIFFERENTIAL/PLATELET
Abs Immature Granulocytes: 0.03 K/uL (ref 0.00–0.07)
Basophils Absolute: 0 K/uL (ref 0.0–0.1)
Basophils Relative: 1 %
Eosinophils Absolute: 0.1 K/uL (ref 0.0–0.5)
Eosinophils Relative: 2 %
HCT: 28.6 % — ABNORMAL LOW (ref 39.0–52.0)
Hemoglobin: 8.8 g/dL — ABNORMAL LOW (ref 13.0–17.0)
Immature Granulocytes: 1 %
Lymphocytes Relative: 15 %
Lymphs Abs: 0.9 K/uL (ref 0.7–4.0)
MCH: 29.7 pg (ref 26.0–34.0)
MCHC: 30.8 g/dL (ref 30.0–36.0)
MCV: 96.6 fL (ref 80.0–100.0)
Monocytes Absolute: 0.8 K/uL (ref 0.1–1.0)
Monocytes Relative: 13 %
Neutro Abs: 4.1 K/uL (ref 1.7–7.7)
Neutrophils Relative %: 68 %
Platelets: 113 K/uL — ABNORMAL LOW (ref 150–400)
RBC: 2.96 MIL/uL — ABNORMAL LOW (ref 4.22–5.81)
RDW: 14.6 % (ref 11.5–15.5)
WBC: 5.9 K/uL (ref 4.0–10.5)
nRBC: 0 % (ref 0.0–0.2)

## 2024-02-08 MED ORDER — SODIUM ZIRCONIUM CYCLOSILICATE 10 G PO PACK
10.0000 g | PACK | Freq: Once | ORAL | Status: AC
  Administered 2024-02-08: 10 g via ORAL
  Filled 2024-02-08: qty 1

## 2024-02-08 NOTE — Consult Note (Signed)
 Chief Complaint: Infiltrated left upper extremity AVF - IR consulted for fistulogram  Referring Provider(s): Lenon Charmaine BRAVO, NP   Supervising Physician: Jennefer Rover  Patient Status: [name] - In-pt  History of Present Illness: [name] is a 68 y.o. male with hx of CKD on HD. Pt went for dialysis yesterday and LUE AVF reportedly infiltrated and no fluid was removed. Pt and family notes he has had difficulty with AVF fistula in the past. Nephrology evaluated patient and IR has now been consulted for fistulogram for further evaluation.  Today pt without complaint. IR will plan to tentatively proceed tomorrow 9/8. Pt to be NPO at midnight.    Patient is Full Code  Past Medical History:  Diagnosis Date   Abdominopelvic abscess (HCC) 09/29/2018   Abscess of appendix 09/22/2018   Acquired spondylolisthesis of lumbosacral region 03/20/2020   Acute cholecystitis 11/15/2023   Acute kidney injury (HCC) 10/07/2018   Acute kidney injury superimposed on chronic kidney disease (HCC) 11/06/2020   Acute kidney injury superimposed on CKD (HCC) 02/18/2020   Acute on chronic systolic (congestive) heart failure (HCC) 07/26/2015   Formatting of this note might be different from the original. Formatting of this note might be different from the original. Formatting of this note might be different from the original. revenkar EF 35% Formatting of this note might be different from the original. revenkar EF 35%     Acute on chronic systolic CHF (congestive heart failure) (HCC) 08/12/2023   Anasarca 07/01/2023   Anemia of chronic disease 08/18/2019   Anemia, chronic disease 08/18/2019   Aortic atherosclerosis (HCC)    Aortic stenosis    a.) TTE 09/15/2018: mild-mod (MPG 19); b.) TTE 03/21/2021: mild-mod (MPG 14.3); c.) TTE 04/24/2022: mild- mod (MPG 15)   Aortic stenosis, moderate 10/15/2019   Appendicitis 09/08/2018   ARF (acute renal failure) (HCC) 02/19/2020   Asthma    Atherosclerosis  of native arteries of the extremities with ulceration (HCC) 02/24/2018   Formatting of this note might be different from the original.  Last Assessment & Plan:   His ABIs today are 1.07 on the right and 0.99 on the left with biphasic waveforms.  Although these pressures may be somewhat elevated from medial calcification, his flow does appear to be reasonably good.  His left ABI was 0.58 prior to intervention.  At this point, we will stretch out his follow-up and see hi   Benign hypertension with CKD (chronic kidney disease) stage IV (HCC) 02/24/2018   Benign prostatic hyperplasia without lower urinary tract symptoms 01/14/2018   Bilateral lower extremity edema 11/04/2018   Bradycardia 03/25/2018   Bruit of right carotid artery 07/26/2015   CAD (coronary artery disease) 2003   a.) s/p 4v CABG 2003   Cardiac murmur 07/26/2015   Choledocholithiasis 11/12/2023   Chronic combined systolic and diastolic CHF (congestive heart failure) (HCC) 07/26/2015   a.) TTE 09/15/2018: EF 50-55%, mod LVH, RVE, BAE, mild-mod TR, AoV sclerosis, G1DD; b.) MPI 10/27/2019: EF <30%; c.) TTE 03/21/2021: EF 35-40%, post AK, inf HK, mod LVH, mod red RVSF, mod LAE, mod Aov sclerosis, G2DD; c.) TTE 04/24/2022: EF 35-40%, post AK, glok HK, mod LVE, mod red RVSF, mild-mod MR, Aov sclerosis, G3DD   Chronic idiopathic constipation 11/02/2018   Chronic low back pain without sciatica 12/02/2018   Chronic pain of right hip 03/31/2020   Chronic systolic heart failure (HCC) 07/26/2015   Formatting of this note might be different from the original. revenkar EF 35%  CKD (chronic kidney disease) stage 4, GFR 15-29 ml/min (HCC) 10/07/2018   Congestive heart failure (HCC) 07/26/2015   Last Assessment & Plan:   Poor cardiac function could certainly be contributing to poor blood supply to the feet and toes as well.     Formatting of this note might be different from the original.  Formatting of this note might be different from the  original.  Last Assessment & Plan:   Poor cardiac function could certainly be contributing to poor blood supply to the feet and toes as well.     Last   Coronary artery disease    Diabetes mellitus without complication (HCC)    Dysphagia 07/26/2015   Encounter for long-term (current) use of insulin  (HCC) 09/27/2020   Erectile dysfunction 07/14/2017   ESRD on dialysis [facility]) 11/15/2023   Essential hypertension 02/24/2018   Fever 11/07/2020   Foot ulcer (HCC) 09/27/2020   left foot  Formatting of this note might be different from the original.  Formatting of this note might be different from the original.  left foot     Foot ulcer, left (HCC)    GERD (gastroesophageal reflux disease)    Heart murmur 07/26/2015   Hematuria 07/27/2015   High risk medication use 07/26/2015   History of marijuana use    Hyperkalemia 05/29/2020   Hyperlipidemia 07/26/2015   Hyperlipidemia associated with type 2 diabetes mellitus (HCC) 02/18/2020   Hypertension    Hypertension associated with diabetes (HCC)    Hypoglycemia 05/07/2019   Hypothyroidism 11/14/2015   Hypothyroidism (acquired) 11/14/2015   Insomnia 11/02/2018   Kidney insufficiency    Lumbar spondylosis 03/20/2020   Malaise and fatigue 03/25/2018   Malnutrition of moderate degree 08/20/2023   Mitral regurgitation 05/12/2019   Moderate aortic regurgitation 05/12/2019   Myocardial infarction due to demand ischemia (HCC) 09/26/2020   a.) Type II NSTEMI; b.) troponins were trended 0.54 --> 0.56 --> 0.52 ng/mL   Non-compliance 05/05/2019   NSTEMI (non-ST elevated myocardial infarction) (HCC) 09/27/2020   Osseous and subluxation stenosis of intervertebral foramina of lumbar region 03/20/2020   Osteoarthritis 10/06/2019   Pancytopenia (HCC) 08/12/2023   Peripheral vascular disease (HCC)    Personal history of tobacco use, presenting hazards to health 09/27/2020   Polyneuropathy in diabetes (HCC) 05/07/2019   Primary osteoarthritis involving  multiple joints 10/06/2019   Primary osteoarthritis of right hip 02/21/2020   Pulmonary HTN (HCC)    a.) TTE 05/12/2019: PASP 71; b.) TTE 09/27/2020: PASP >70; c.) TTE 03/21/2021: RVSP 43; d.) TTE 04/24/2022: RVSP 80.6   Puncture wound of right hip 04/02/2018   PVD (peripheral vascular disease) with claudication (HCC)    a.) s/p PTA 03/02/2018 - balloon angioplasty LEFT below knee popliteal artery; b.) s/p PTA 09/30/2022: baloon angioplasty LEFT tibioperoneal trunck, most proximal peroneal artery, and LEFT popliteal artery.   Regional wall motion abnormality of heart 05/07/2019   Right groin pain 02/18/2020   S/P CABG x 4 2003   S/P TAVR (transcatheter aortic valve replacement) 01/20/2024   TAVR with a 29 mm Edwards Sapien 3 Ultra Resilia THV via the TF by Dr. Wendel and Dr. Shyrl   Screening for colon cancer 02/18/2018   Sepsis (HCC) 11/15/2023   Sleep apnea 07/26/2015   a.) does not require nocturnal PAP therapy   Subacute osteomyelitis of left foot (HCC) 03/25/2018   Thrombocytopenia (HCC)    Type 2 diabetes mellitus with hyperlipidemia (HCC) 07/26/2015   Type 2 diabetes mellitus with stage 4 chronic  kidney disease, with long-term current use of insulin  (HCC) 07/26/2015   Type II diabetes mellitus with neurological manifestations (HCC) 02/18/2020   Vitamin B12 deficiency 06/16/2019   Vitamin D  deficiency 05/07/2018    Past Surgical History:  Procedure Laterality Date   AMPUTATION Left 01/14/2024   Procedure: AMPUTATION, FOOT, PARTIAL;  Surgeon: Serene Gaile ORN, MD;  Location: [facility] OR;  Service: Vascular;  Laterality: Left;   AMPUTATION Left 01/30/2024   Procedure: AMPUTATION BELOW KNEE;  Surgeon: Serene Gaile ORN, MD;  Location: [facility] OR;  Service: Vascular;  Laterality: Left;   AMPUTATION TOE Left 03/13/2018   Procedure: AMPUTATION TOE-MPJ;  Surgeon: Ashley Soulier, DPM;  Location: [facility] ORS;  Service: Podiatry;  Laterality: Left;   ANGIOPLASTY     AV FISTULA PLACEMENT Left  08/20/2023   Procedure: ARTERIOVENOUS (AV) FISTULA CREATION;  Surgeon: Gretta Lonni PARAS, MD;  Location: [facility] OR;  Service: Vascular;  Laterality: Left;   CARDIAC CATHETERIZATION     CORONARY ARTERY BYPASS GRAFT N/A 2003   INTRAOPERATIVE TRANSTHORACIC ECHOCARDIOGRAM N/A 01/20/2024   Procedure: ECHOCARDIOGRAM, TRANSTHORACIC;  Surgeon: Wendel Lurena POUR, MD;  Location: [facility];  Service: Cardiovascular;  Laterality: N/A;   IR CATHETER TUBE CHANGE  12/31/2023   IR EXCHANGE BILIARY DRAIN  02/03/2024   IR FLUORO GUIDE CV LINE RIGHT  08/18/2023   IR PERC CHOLECYSTOSTOMY  11/12/2023   IR RADIOLOGIST EVAL & MGMT  12/31/2023   IR US  GUIDE VASC ACCESS RIGHT  08/18/2023   LAPAROSCOPIC APPENDECTOMY N/A 09/08/2018   Procedure: APPENDECTOMY LAPAROSCOPIC;  Surgeon: Jordis Laneta FALCON, MD;  Location: [facility] ORS;  Service: General;  Laterality: N/A;   LOWER EXTREMITY ANGIOGRAPHY Left 03/02/2018   Procedure: LOWER EXTREMITY ANGIOGRAPHY;  Surgeon: Marea Selinda RAMAN, MD;  Location: [facility] INVASIVE CV LAB;  Service: Cardiovascular;  Laterality: Left;   LOWER EXTREMITY ANGIOGRAPHY Left 09/30/2022   Procedure: Lower Extremity Angiography;  Surgeon: Marea Selinda RAMAN, MD;  Location: [facility] INV[facility];  Service: Cardiovascular;  Laterality: Left;   LOWER EXTREMITY ANGIOGRAPHY Left 12/26/2023   Procedure: Lower Extremity Angiography;  Surgeon: Marea Selinda RAMAN, MD;  Location: [facility];  Service: Cardiovascular;  Laterality: Left;   RIGHT/LEFT HEART CATH AND CORONARY/GRAFT ANGIOGRAPHY N/A 08/01/2023   Procedure: RIGHT/LEFT HEART CATH AND CORONARY/GRAFT ANGIOGRAPHY;  Surgeon: Wendel Lurena POUR, MD;  Location: [facility];  Service: Cardiovascular;  Laterality: N/A;   ROTATOR CUFF REPAIR Left    TRANSMETATARSAL AMPUTATION Left 01/16/2024   Procedure: AMPUTATION, FOOT, TRANSMETATARSAL;  Surgeon: Serene Gaile ORN, MD;  Location: [facility] OR;  Service: Vascular;  Laterality: Left;    Allergies: Patient has no known  allergies.  Medications: Prior to Admission medications   Medication Sig Start Date End Date Taking? Authorizing Provider  acetaminophen  (TYLENOL ) 500 MG tablet Take 1,000 mg by mouth every 6 (six) hours as needed for moderate pain or headache.     [provider]  amoxicillin -clavulanate (AUGMENTIN ) 500-125 MG tablet Take 1 tablet by mouth 2 (two) times daily. 02/05/24   Mcarthur Pick, MD  aspirin  EC 81 MG tablet Take 1 tablet (81 mg total) by mouth daily. Swallow whole. 07/25/23   Thukkani, Arun K, MD  atorvastatin  (LIPITOR) 20 MG tablet Take 1 tablet (20 mg total) by mouth daily. 07/25/23   Thukkani, Arun K, MD  bisacodyl  (DULCOLAX) 5 MG EC tablet Take 1 tablet (5 mg total) by mouth daily as needed for moderate constipation. 02/05/24   Sigdel, Santosh, MD  carvedilol  (COREG )  3.125 MG tablet Take 1 tablet (3.125 mg total) by mouth 2 (two) times daily. 05/20/23 05/02/24  Carlin Delon BROCKS, NP  clopidogrel  (PLAVIX ) 75 MG tablet Take 1 tablet (75 mg total) by mouth daily. 12/26/23   Marea Selinda RAMAN, MD  gabapentin  (NEURONTIN ) 300 MG capsule Take 1 capsule (300 mg total) by mouth at bedtime. Patient taking differently: Take 600 mg by mouth 2 (two) times daily. 08/25/23   Arrien, Mauricio Daniel, MD  insulin  glargine (LANTUS ) 100 UNIT/ML injection Inject 0.07 mLs (7 Units total) into the skin at bedtime. Inject 7 units at bedtime as needed after checking glucose 02/05/24   Mcarthur Pick, MD  isosorbide  mononitrate (IMDUR ) 120 MG 24 hr tablet Take 1 tablet (120 mg total) by mouth daily. 08/27/23   Revankar, Jennifer SAUNDERS, MD  latanoprost  (XALATAN ) 0.005 % ophthalmic solution Place 1 drop into both eyes at bedtime. 04/15/23   [provider]  levothyroxine  (SYNTHROID ) 50 MCG tablet Take 50 mcg by mouth daily. 04/05/23   [provider]  linezolid  (ZYVOX ) 600 MG tablet Take 1 tablet (600 mg total) by mouth every 12 (twelve) hours. 02/05/24   Sigdel, Santosh, MD  Methoxy PEG-Epoetin Beta (MIRCERA  IJ) 30 mcg. 12/16/23 12/14/24  [provider]  nitroGLYCERIN  (NITROSTAT ) 0.4 MG SL tablet Place 1 tablet (0.4 mg total) under the tongue every 5 (five) minutes as needed for chest pain. 11/20/20   Revankar, Jennifer SAUNDERS, MD  sevelamer  carbonate (RENVELA ) 800 MG tablet Take 800 mg by mouth 3 (three) times daily with meals. 09/04/23 11/29/24  [provider]  sodium chloride  flush (NS) 0.9 % SOLN Inject 5 mLs into the vein daily for 15 days. flush drain daily with 5 cc NS, record output daily, dressing changes every 2-3 days or earlier if soiled 11/14/23 11/29/23  Darci Pore, MD  sodium chloride  flush 0.9 % SOLN injection 10 mLs by Intracatheter route every 3 (three) days. 01/02/24 04/01/24  Covington, Jamie R, NP  tamsulosin  (FLOMAX ) 0.4 MG CAPS capsule Take 0.4 mg by mouth daily after breakfast. 04/19/20   [provider]  traMADol  (ULTRAM ) 50 MG tablet Take 50 mg by mouth every 8 (eight) hours as needed. 09/15/23   [provider]     Family History  Problem Relation Age of Onset   Hyperlipidemia Mother    Hypertension Mother    Diabetes Mother    Heart attack Brother    Heart disease Brother    Lung disease Father     Social History   Socioeconomic History   Marital status: Married    Spouse name: Not on file   Number of children: Not on file   Years of education: Not on file   Highest education level: Not on file  Occupational History   Not on file  Tobacco Use   Smoking status: Former    Current packs/day: 0.00    Average packs/day: 0.5 packs/day for 25.0 years (12.5 ttl pk-yrs)    Types: Cigarettes    Start date: 82    Quit date: 2003    Years since quitting: 22.6   Smokeless tobacco: Never  Vaping Use   Vaping status: Never Used  Substance and Sexual Activity   Alcohol use: Not Currently    Comment: 2003   Drug use: Never   Sexual activity: Not on file  Other Topics Concern   Not on file  Social History Narrative   Not on file    Social Drivers of Health  Financial Resource Strain: Low Risk  (09/26/2020)   Received from [name]   Overall Financial Resource Strain (CARDIA)    Difficulty of Paying Living Expenses: Not hard at all  Food Insecurity: No Food Insecurity (01/15/2024)   Hunger Vital Sign    Worried About Running Out of Food in the Last Year: Never true    Ran Out of Food in the Last Year: Never true  Transportation Needs: No Transportation Needs (01/15/2024)   PRAPARE - Administrator, Civil Service (Medical): No    Lack of Transportation (Non-Medical): No  Physical Activity: Inactive (09/26/2020)   Received from [name]   Exercise Vital Sign    On average, how many days per week do you engage in moderate to strenuous exercise (like a brisk walk)?: 0 days    On average, how many minutes do you engage in exercise at this level?: 0 min  Stress: No Stress Concern Present (09/26/2020)   Received from [facility] - Occupational Stress Questionnaire    Feeling of Stress : Not at all  Social Connections: Moderately Integrated (01/21/2024)   Social Connection and Isolation Panel    Frequency of Communication with Friends and Family: More than three times a week    Frequency of Social Gatherings with Friends and Family: Twice a week    Attends Religious Services: More than 4 times per year    Active Member of [employer] or Organizations: No    Attends Banker Meetings: Never    Marital Status: Married     Review of Systems: A 12 point ROS discussed and pertinent positives are indicated in the HPI above.  All other systems are negative.   Vital Signs: BP (!) 100/45   Pulse 72   Temp 97.7 F (36.5 C) (Oral)   Resp 17   Ht 6' (1.829 m)   Wt 152 lb 5.4 oz (69.1 kg)   SpO2 100%   BMI 20.66 kg/m   Advance Care Plan: No documents on file  Physical Exam Vitals and nursing note reviewed.  Constitutional:      General:  He is not in acute distress. HENT:     Mouth/Throat:     Mouth: Mucous membranes are moist.     Pharynx: Oropharynx is clear.  Cardiovascular:     Rate and Rhythm: Normal rate and regular rhythm.  Pulmonary:     Effort: Pulmonary effort is normal.     Breath sounds: Normal breath sounds.  Abdominal:     Palpations: Abdomen is soft.     Tenderness: There is no abdominal tenderness.  Musculoskeletal:     Right lower leg: No edema.     Left lower leg: No edema.     Comments: + LUE fistula. + palpable thrill. No overlying abnormality.   Skin:    General: Skin is warm and dry.  Neurological:     Mental Status: He is alert and oriented to person, place, and time. Mental status is at baseline.     Imaging: CT ABDOMEN PELVIS WO CONTRAST Result Date: 02/05/2024 CLINICAL DATA:  Cholecystostomy tube not flushing. Evaluate for placement location. EXAM: CT ABDOMEN AND PELVIS WITHOUT CONTRAST TECHNIQUE: Multidetector CT imaging of the abdomen and pelvis was performed following the standard protocol without IV contrast. RADIATION DOSE REDUCTION: This exam was performed according to the departmental dose-optimization program which includes automated exposure control, adjustment of the mA and/or kV  according to patient size and/or use of iterative reconstruction technique. COMPARISON:  CT abdomen pelvis dated 02/02/2024. FINDINGS: Evaluation of this exam is limited in the absence of intravenous contrast. Lower chest: No acute abnormality. No intra-abdominal free air or free fluid. Hepatobiliary: The liver is unremarkable. Percutaneous cholecystostomy with pigtail tip in the gallbladder lumen in similar position as the prior CT. The gallbladder is filled with stones. There is thickened appearance of the gallbladder wall. Pancreas: Unremarkable. No pancreatic ductal dilatation or surrounding inflammatory changes. Spleen: Normal in size without focal abnormality. Adrenals/Urinary Tract: The adrenal glands are  unremarkable. Mild right pelvicaliectasis. Excreted contrast noted in the renal collecting systems and urinary bladder. Stomach/Bowel: Dense oral contrast noted throughout the colon. There is no bowel obstruction or active inflammation. Appendectomy. Vascular/Lymphatic: Mild aortoiliac atherosclerotic disease. The IVC is unremarkable. No portal gas. There is no adenopathy. Reproductive: The prostate and seminal vesicles are grossly unremarkable Other: None Musculoskeletal: Degenerative changes of the spine. Bilateral L5 pars defects with grade 1 anterolisthesis. No acute osseous pathology. IMPRESSION: 1. Percutaneous cholecystostomy with pigtail tip in the gallbladder lumen in similar position as the prior CT. 2. Cholelithiasis. 3. No bowel obstruction. 4.  Aortic Atherosclerosis (ICD10-I70.0). Electronically Signed   By: Vanetta Chou M.D.   On: 02/05/2024 20:04   IR EXCHANGE BILIARY DRAIN Result Date: 02/03/2024 INDICATION: History of acute cholecystitis, post ultrasound fluoroscopic guided cholecystostomy tube placement on 12/31/2023. EXAM: FLUOROSCOPIC GUIDED CHOLECYSTOSTOMY TUBE EXCHANGE COMPARISON:  12/31/2023, 02/02/2024 MEDICATIONS: None ANESTHESIA/SEDATION: None CONTRAST:  20mL OMNIPAQUE  IOHEXOL  300 MG/ML SOLN - administered into the gallbladder lumen. FLUOROSCOPY TIME:  Three mGy reference air kerma COMPLICATIONS: None immediate. PROCEDURE: The patient was positioned supine on the fluoroscopy table. The external portion of the existing cholecystostomy tube as well as the surrounding skin was prepped and draped in usual sterile fashion. A time-out was performed prior to the initiation of the procedure. A preprocedural spot fluoroscopic image was obtained of the right upper abdominal quadrant existing cholecystostomy tube. The skin surrounding the cholecystostomy tube was anesthetized with 1% lidocaine  with epinephrine . The external portion of the cholecystostomy tube was cut and cannulated with a short  Amplatz wire which was advanced through the tube and coiled within the gallbladder lumen Next, under intermittent fluoroscopic guidance, the existing 10 French cholecystostomy tube was exchanged for a new 10 [place] cholecystostomy tube which was repositioned into the more central aspect of the gallbladder lumen. Contrast injection confirms appropriate positioning and functionality of the cholecystomy tube. The cholecystostomy tube was flushed with a small amount of saline and reconnected to a suction bulb. The cholecystostomy tube was secured with an interrupted suture and a Stat Lock device. A dressing was applied. The patient tolerated the procedure well without immediate postprocedural complication. FINDINGS: Preprocedural spot fluoroscopic image demonstrates unchanged positioning of cholecystostomy tube with end coiled and locked over the expected location of the fundus of the gallbladder There was purulent aspirate from the drain. After fluoroscopic guided exchange, the new 10 [place] cholecystostomy tube is more ideally positioned with end coiled and locked within the central aspect of the gallbladder lumen. Post exchange cholangiogram demonstrates appropriate positioning and functionality of the new cholecystostomy tube. IMPRESSION: Successful fluoroscopic guided exchange of 10 Fr percutaneous cholecystostomy tube. PLAN: Keep to bulb suction until purulent output clears, then switch to bag drainage. Ester Sides, MD [name] Electronically Signed   By: Ester Sides M.D.   On: 02/03/2024 22:20   CT ABDOMEN PELVIS W  CONTRAST Result Date: 02/02/2024 CLINICAL DATA:  Provided history: purulence from cholecystostomy tube, rule out abscess EXAM: CT ABDOMEN AND PELVIS WITH CONTRAST TECHNIQUE: Multidetector CT imaging of the abdomen and pelvis was performed using the standard protocol following bolus administration of intravenous contrast. RADIATION DOSE  REDUCTION: This exam was performed according to the departmental dose-optimization program which includes automated exposure control, adjustment of the mA and/or kV according to patient size and/or use of iterative reconstruction technique. CONTRAST:  75mL OMNIPAQUE  IOHEXOL  350 MG/ML SOLN COMPARISON:  Most recent abdominal imaging MRI 11/11/2023, abdominopelvic CT 11/06/2020 FINDINGS: Lower chest: 4 mm subpleural left lower lobe nodule, series 4, image 11, unchanged from 2022 and considered definitively benign. No pleural fluid or basilar opacity. Hepatobiliary: Cholecystostomy tube is coiled in the gallbladder. The gallbladder is filled with innumerable gallstones. Soft tissue thickening, stranding, and minimal fluid is seen adjacent to the portion of the tubing outside the gallbladder extending to the subcutaneous tissues. No definite pericholecystic collection. Suggestion of mild pericholecystic fat stranding. Gallbladder wall is not well assessed on the current exam. No common bile duct dilatation. No intrahepatic biliary ductal dilatation. Diffuse hepatic steatosis. No evidence of intrahepatic collection. Pancreas: No ductal dilatation or inflammation. Spleen: Mildly enlarged, 13.5 cm AP. Tiny hypodensity in the inferior spleen is too small to characterize but typically of no clinical significance. Adrenals/Urinary Tract: No adrenal nodule. No hydronephrosis or evidence of renal inflammation. Absent renal excretion on delayed phase imaging. No renal calculi. Partially distended urinary bladder. No bladder wall thickening. Stomach/Bowel: Physiologic appearance of the stomach. There is no small bowel obstruction or inflammation. Moderate formed stool in the colon. Scattered colonic diverticula. No diverticulitis. Appendectomy. Vascular/Lymphatic: Advanced aortic atherosclerosis. No aortic aneurysm. Patent portal and splenic veins. No suspicious lymphadenopathy. Reproductive: Prostate is unremarkable. Other: No  free air. No significant ascites. Soft tissue density in gas within the anterior abdominal wall likely related to medication injections. Musculoskeletal: No acute osseous findings. Right hip osteoarthritis. Lumbar degenerative change. Chronic bilateral L5 pars defects. IMPRESSION: 1. Cholecystostomy tube is coiled in the gallbladder. The gallbladder is filled with innumerable gallstones. Soft tissue thickening, stranding, and minimal fluid is seen adjacent to the portion of the tubing outside the gallbladder extending to the subcutaneous tissues. No definite pericholecystic collection. Suggestion of mild pericholecystic fat stranding. 2. Hepatic steatosis. 3. Mild splenomegaly. 4. Colonic diverticulosis without diverticulitis. 5. Absent renal excretion on delayed phase imaging, consistent with end-stage renal disease. Aortic Atherosclerosis (ICD10-I70.0). Electronically Signed   By: Andrea Gasman M.D.   On: 02/02/2024 19:51   ECHOCARDIOGRAM COMPLETE Result Date: 01/21/2024    ECHOCARDIOGRAM REPORT   Patient Name:   Phillip [name] Date of Exam: 01/21/2024 Medical Rec #:  [id number]        Height:       72.0 in Accession #:    [id number]       Weight:       165.6 lb Date of Birth:  04/22/1956       BSA:          1.966 m Patient Age:    67 years         BP:           128/75 mmHg Patient Gender: M                HR:           77 bpm. Exam Location:  Inpatient Procedure: 2D Echo, Cardiac Doppler and Color Doppler (Both Spectral and  Color            Flow Doppler were utilized during procedure). Indications:    Post TAVR evaluation. ; I35.0 Nonrheumatic aortic (valve)                 stenosis  History:        Patient has prior history of Echocardiogram examinations, most                 recent 01/20/2024. CHF, CAD, Prior CABG, Pulmonary HTN, Aortic                 Valve Disease, Signs/Symptoms:Edema; Risk Factors:Hypertension                 and Dyslipidemia. ESRD.                 Aortic Valve: 29 mm Edwards Sapien  prosthetic, stented (TAVR)                 valve is present in the aortic position. Procedure Date:                 01/20/2024.  Sonographer:    Ellouise Mose RDCS Referring Phys: [id number] [facility] [name] R THOMPSON  Sonographer Comments: Technically difficult study due to poor echo windows. IMPRESSIONS  1. Left ventricular ejection fraction, by estimation, is 30 to 35%. The left ventricle has moderately decreased function. The left ventricle demonstrates regional wall motion abnormalities (see scoring diagram/findings for description). The left ventricular internal cavity size was moderately dilated. There is mild concentric left ventricular hypertrophy. Left ventricular diastolic parameters are consistent with Grade I diastolic dysfunction (impaired relaxation).  2. The mitral valve is degenerative. Mild to moderate mitral valve regurgitation. No evidence of mitral stenosis. Moderate mitral annular calcification.  3. The aortic valve has been repaired/replaced. There is a 29 mm Edwards Sapien prosthetic (TAVR) valve present in the aortic position. Procedure Date: 01/20/2024. Echo findings are consistent with trivial perivalvular leak of the aortic prosthesis.  4. Right ventricular systolic function is moderately reduced. The right ventricular size is normal.  5. Left atrial size was mildly dilated. FINDINGS  Left Ventricle: Left ventricular ejection fraction, by estimation, is 30 to 35%. The left ventricle has moderately decreased function. The left ventricle demonstrates regional wall motion abnormalities. The left ventricular internal cavity size was moderately dilated. There is mild concentric left ventricular hypertrophy. Left ventricular diastolic parameters are consistent with Grade I diastolic dysfunction (impaired relaxation).  LV Wall Scoring: The inferior wall and posterior wall are akinetic. Right Ventricle: The right ventricular size is normal. No increase in right ventricular wall thickness. Right ventricular  systolic function is moderately reduced. Left Atrium: Left atrial size was mildly dilated. Right Atrium: Right atrial size was normal in size. Pericardium: There is no evidence of pericardial effusion. Mitral Valve: The mitral valve is degenerative in appearance. Moderate mitral annular calcification. Mild to moderate mitral valve regurgitation. No evidence of mitral valve stenosis. MV peak gradient, 7.8 mmHg. The mean mitral valve gradient is 3.0 mmHg. Tricuspid Valve: The tricuspid valve is normal in structure. Tricuspid valve regurgitation is trivial. No evidence of tricuspid stenosis. Aortic Valve: Trivial perivalvular leak and 2 o'clock. The aortic valve has been repaired/replaced. Aortic valve regurgitation is not visualized. No aortic stenosis is present. Aortic valve mean gradient measures 5.0 mmHg. Aortic valve peak gradient measures 10.2 mmHg. Aortic valve area, by VTI measures 4.55 cm. There is a 29 mm Edwards Sapien prosthetic, stented (TAVR) valve  present in the aortic position. Procedure Date: 01/20/2024. Echo findings are consistent with normal structure and function of the aortic valve prosthesis. Pulmonic Valve: The pulmonic valve was normal in structure. Pulmonic valve regurgitation is not visualized. No evidence of pulmonic stenosis. Aorta: The aortic root and ascending aorta are structurally normal, with no evidence of dilitation. IAS/Shunts: There is redundancy of the interatrial septum. No atrial level shunt detected by color flow Doppler.  LEFT VENTRICLE PLAX 2D LVIDd:         5.18 cm      Diastology LVIDs:         4.80 cm      LV e' medial:    3.92 cm/s LV PW:         1.28 cm      LV E/e' medial:  22.8 LV IVS:        1.39 cm      LV e' lateral:   5.55 cm/s LVOT diam:     2.90 cm      LV E/e' lateral: 16.1 LV SV:         124 LV SV Index:   63 LVOT Area:     6.61 cm  LV Volumes (MOD) LV vol d, MOD A2C: 277.0 ml LV vol d, MOD A4C: 198.0 ml LV vol s, MOD A2C: 206.0 ml LV vol s, MOD A4C: 138.0 ml  LV SV MOD A2C:     71.0 ml LV SV MOD A4C:     198.0 ml LV SV MOD BP:      71.8 ml RIGHT VENTRICLE            IVC RV S prime:     7.40 cm/s  IVC diam: 1.70 cm TAPSE (M-mode): 0.7 cm LEFT ATRIUM             Index        RIGHT ATRIUM           Index LA diam:        3.96 cm 2.01 cm/m   RA Area:     16.20 cm LA Vol (A2C):   38.2 ml 19.43 ml/m  RA Volume:   46.30 ml  23.55 ml/m LA Vol (A4C):   28.2 ml 14.34 ml/m LA Biplane Vol: 32.4 ml 16.48 ml/m  AORTIC VALVE AV Area (Vmax):    5.08 cm AV Area (Vmean):   4.73 cm AV Area (VTI):     4.55 cm AV Vmax:           160.00 cm/s AV Vmean:          100.467 cm/s AV VTI:            0.273 m AV Peak Grad:      10.2 mmHg AV Mean Grad:      5.0 mmHg LVOT Vmax:         123.00 cm/s LVOT Vmean:        71.900 cm/s LVOT VTI:          0.188 m LVOT/AV VTI ratio: 0.69  AORTA Ao Root diam: 3.51 cm Ao Asc diam:  3.59 cm MITRAL VALVE MV Area (PHT): 4.15 cm     SHUNTS MV Area VTI:   3.42 cm     Systemic VTI:  0.19 m MV Peak grad:  7.8 mmHg     Systemic Diam: 2.90 cm MV Mean grad:  3.0 mmHg MV Vmax:       1.40 m/s MV Vmean:  88.5 cm/s MV Decel Time: 183 msec MV E velocity: 89.40 cm/s MV A velocity: 127.00 cm/s MV E/A ratio:  0.70 Toribio Fuel MD Electronically signed by Toribio Fuel MD Signature Date/Time: 01/21/2024/12:14:45 PM    Final    ECHOCARDIOGRAM LIMITED Result Date: 01/20/2024    ECHOCARDIOGRAM LIMITED REPORT   Patient Name:   Phillip [name] Date of Exam: 01/20/2024 Medical Rec #:  [id number]        Height:       72.0 in Accession #:    [id number]       Weight:       165.8 lb Date of Birth:  [birth year]       BSA:          1.967 m Patient Age:    67 years         BP:           105/60 mmHg Patient Gender: M                HR:           61 bpm. Exam Location:  Inpatient Procedure: Limited Color Doppler (Both Spectral and Color Flow Doppler were            utilized during procedure). Indications:     aortic stenosis. TAVR  History:         Patient has prior history of  Echocardiogram examinations, most                  recent 08/01/2023. CAD, Prior CABG, end stage renal disease;                  Risk Factors:Hypertension, Dyslipidemia, Diabetes and Sleep                  Apnea.                  Aortic Valve: 29 mm valve is present in the aortic position.                  Procedure Date: 01/20/2024.  Sonographer:     Tinnie Barefoot RDCS Referring Phys:  [id number] LAM[facility] SAUNDERS THOMPSON Diagnosing Phys: Darryle Decent MD IMPRESSIONS  1. Echo guided TAVR. Vmax 1.21 m/s, MG 3.0 mmHG, EOA 4.57 cm2. No regurgitation or paravalvular leak. Aortic valve regurgitation is not visualized. There is a 29 mm valve present in the aortic position. Procedure Date: 01/20/2024. Aortic valve area, by VTI measures 4.57 cm. Aortic valve mean gradient measures 3.0 mmHg. Aortic valve Vmax measures 1.21 m/s.  2. Left ventricular ejection fraction, by estimation, is 30 to 35%. The left ventricle has moderately decreased function.  3. Right ventricular systolic function is normal. The right ventricular size is normal.  4. The mitral valve is degenerative. Mild mitral valve regurgitation. No evidence of mitral stenosis. FINDINGS  Left Ventricle: Left ventricular ejection fraction, by estimation, is 30 to 35%. The left ventricle has moderately decreased function.  LV Wall Scoring: The inferior wall and posterior wall are akinetic. Right Ventricle: The right ventricular size is normal. No increase in right ventricular wall thickness. Right ventricular systolic function is normal. Pericardium: There is no evidence of pericardial effusion. Mitral Valve: The mitral valve is degenerative in appearance. Mild mitral valve regurgitation. No evidence of mitral valve stenosis. Tricuspid Valve: The tricuspid valve is grossly normal. Tricuspid valve regurgitation is mild . No evidence of tricuspid stenosis. Aortic Valve: Echo guided TAVR. Vmax 1.21 m/s,  MG 3.0 mmHG, EOA 4.57 cm2. No regurgitation or paravalvular leak. Aortic  valve regurgitation is not visualized. Aortic regurgitation PHT measures 446 msec. Aortic valve mean gradient measures 3.0 mmHg. Aortic valve peak gradient measures 5.9 mmHg. Aortic valve area, by VTI measures 4.57 cm. There is a 29 mm valve present in the aortic position. Procedure Date: 01/20/2024. Aorta: The aortic root and ascending aorta are structurally normal, with no evidence of dilitation. Additional Comments: Spectral Doppler performed. Color Doppler performed.  LEFT VENTRICLE PLAX 2D LVIDd:         5.30 cm LV PW:         1.00 cm LV IVS:        1.20 cm LVOT diam:     2.68 cm LV SV:         105 LV SV Index:   54 LVOT Area:     5.64 cm  LV Volumes (MOD) LV vol d, MOD A4C: 181.0 ml LV vol s, MOD A4C: 117.0 ml LV SV MOD A4C:     181.0 ml LEFT ATRIUM         Index LA diam:    3.70 cm 1.88 cm/m  AORTIC VALVE AV Area (Vmax):    4.90 cm AV Area (Vmean):   4.80 cm AV Area (VTI):     4.57 cm AV Vmax:           121.00 cm/s AV Vmean:          80.400 cm/s AV VTI:            0.231 m AV Peak Grad:      5.9 mmHg AV Mean Grad:      3.0 mmHg LVOT Vmax:         105.00 cm/s LVOT Vmean:        68.400 cm/s LVOT VTI:          0.187 m LVOT/AV VTI ratio: 0.81 AI PHT:            446 msec  AORTA Ao Root diam: 3.40 cm Ao Asc diam:  3.30 cm  SHUNTS Systemic VTI:  0.19 m Systemic Diam: 2.68 cm Darryle Decent MD Electronically signed by Darryle Decent MD Signature Date/Time: 01/20/2024/6:03:09 PM    Final    Structural Heart Procedure Result Date: 01/20/2024 See surgical note for result.   Labs:  CBC: Recent Labs    02/03/24 1630 02/05/24 0650 02/05/24 1936 02/08/24 0500  WBC 8.9 9.0 9.0 5.9  HGB 7.9* 9.7* 9.4* 8.8*  HCT 25.7* 31.3* 31.6* 28.6*  PLT 144* 156 168 113*    COAGS: Recent Labs    08/17/23 0228 11/11/23 1930 11/12/23 1011 01/19/24 1844  INR 1.4* 1.4* 1.3* 1.1  APTT  --   --   --  39*    BMP: Recent Labs    02/03/24 1630 02/05/24 0650 02/05/24 1936 02/08/24 0500  NA 128* 134* 133* 131*   K 4.6 4.4 4.4 5.0  CL 89* 91* 89* 90*  CO2 22 28 28 27   GLUCOSE 137* 118* 155* 97  BUN 62* 18 25* 41*  CALCIUM  8.1* 8.8* 8.9 8.3*  CREATININE 9.08* 4.24* 5.44* 6.60*  GFRNONAA 6* 14* 11* 9*    LIVER FUNCTION TESTS: Recent Labs    11/13/23 0821 01/15/24 0338 01/19/24 1844 01/26/24 0328 02/03/24 1630 02/05/24 1936 02/08/24 0500  BILITOT 2.4* 0.8 0.8 0.8  --   --   --   AST 187* 19 22 17   --   --   --  ALT 97* 11 <5 <5  --   --   --   ALKPHOS 123 142* 161* 96  --   --   --   PROT 8.1 7.6 9.4* 8.4*  --   --   --   ALBUMIN  2.7* 2.1* 2.5* 2.4* 2.1* 2.4* 2.2*    TUMOR MARKERS: No results for input(s): AFPTM, CEA, CA199, CHROMGRNA in the last 8760 hours.  Assessment and Plan:  Phillip [name] is a 68 y.o. male with hx of CKD on HD. Pt went for dialysis yesterday and LUE AVF reportedly infiltrated and no fluid was removed. Pt and family notes he has had difficulty with AVF fistula in the past. Nephrology evaluated patient and IR has now been consulted for fistulogram for further evaluation.  Today pt without complaint. IR will plan to tentatively proceed tomorrow 9/8. Pt to be NPO at midnight.   Risks and benefits discussed with the patient including, but not limited to bleeding, infection, vascular injury, pulmonary embolism, need for tunneled HD catheter placement or even death.  All of the patient's questions were answered, patient is agreeable to proceed. Consent signed and in chart.   Thank you for allowing our service to participate in [facility] 's care.  Electronically Signed: Kimble VEAR Clas, PA-C   02/08/2024, 2:09 PM      I spent a total of 20 Minutes    in face to face in clinical consultation, greater than 50% of which was counseling/coordinating care for LUE AVF fistulogram.

## 2024-02-08 NOTE — Progress Notes (Signed)
 Occupational Therapy Session Note  Patient Details  Name: Phillip [name] MRN: [id number] Date of Birth: [birth year]  Today's Date: 02/08/2024 OT Individual Time: 9754-9669 OT Individual Time Calculation (min): 45 min    Short Term Goals: Week 1:  OT Short Term Goal 1 (Week 1): Pt will complete LB bathing with Mod A OT Short Term Goal 2 (Week 1): Pt will complete toilet transfer with Min A OT Short Term Goal 3 (Week 1): Pt will complete LB dressing with Mod A  Skilled Therapeutic Interventions/Progress Updates:    The patient  was seated at w/c LOF with family preasant at the time of arrival. The pt indicated that he had a pain response for the L residual limb of 7 on 0-10.  The family of the pt indicated that nursing had provided medication to address his pain prior to my arrival. Patient went on to report having a restless night.  The pt received  instructions regarding skin care checks and he received education regarding phantom limb pain.  The pt was instructed in  the use of AE for night time to reduce his instances for falls, such as placing the 3 n 1 in the  bedroom for night time use or using the 3 n 1 frame in his  walk in shower  for > ease with bathing the pt's front and back areas during peri care. The pt was instructed to place a chair at the mid point between the bedroom and the kitchen for taking rest breaks, he was also informed of the benefit for creating a stationary location for frequently used items in the kitchen to minimize his risk for falls. The pt went on to complete UB exercises usng a 3lb dumb bell, 1 set  15 for bicep curls, shld flexion, lifts and elbow extension with rest brakes as need, the pt required 4 rest breaks.  The pt  was instructed regarding the benefits of relaxation breathing and was able  to incorporate relaxation breathing to improve compliance. The pt went on to complete sit to stands,  2x using the arm of the w/c and the sink at Max to Mod A and was able  to remain in standing for a count of 10. The pt was unsuccessful in completing the third sit to stand secondary to fatigue.  At the completion of the session, the pt remained at w/c LOF with the call light and bedside table within reach and all additional needs addressed.     Therapy Documentation Precautions:  Precautions Precautions: Fall Recall of Precautions/Restrictions: Intact Precaution/Restrictions Comments: R chole drain, L BKA Required Braces or Orthoses: Other Brace Other Brace: limb guard Restrictions Weight Bearing Restrictions Per Provider Order: No LLE Weight Bearing Per Provider Order: Non weight bearing  Therapy/Group: Individual Therapy  Elvera JONETTA Mace 02/08/2024, 4:06 PM

## 2024-02-08 NOTE — Progress Notes (Addendum)
 [facility] Progress Note   Subjective:    Seen and examined patient at bedside. Informed yesterday of patient's AVF infiltrated on HD thus treatment ended early. No fluid was removed. He remains euvolemic on exam. Per patient, perm access has been difficult to cannulate at his outpatient HD center. Ordered a F'gram to be done in IR tomorrow. Likely will need a VVS consult. K+ today 5 and Lokelma  X 1 was ordered.  Objective Vitals:   02/07/24 1519 02/07/24 1934 02/07/24 1955 02/08/24 0500  BP: 109/60 (!) 100/45 (!) 100/45   Pulse: 84 72 72   Resp: 18 17    Temp: 97.7 F (36.5 C) 97.7 F (36.5 C)    TempSrc:  Oral    SpO2: 98% 100%    Weight: 69 kg   69.1 kg  Height:       Physical Exam General: Well appearing man, NAD Heart: RRR Lungs: CTAB Abdomen: soft, R perc chole tube in place, bloody drainage in bulb today Extremities: No edema, L BKA bandaged Dialysis Access: LUE AVF +t/b but area is bruised.   Filed Weights   02/06/24 0431 02/07/24 1519 02/08/24 0500  Weight: 69.9 kg 69 kg 69.1 kg    Intake/Output Summary (Last 24 hours) at 02/08/2024 1308 Last data filed at 02/08/2024 0726 Gross per 24 hour  Intake 138 ml  Output 0 ml  Net 138 ml    Additional Objective Labs: Basic Metabolic Panel: Recent Labs  Lab 02/03/24 1630 02/05/24 0650 02/05/24 1936 02/08/24 0500  NA 128* 134* 133* 131*  K 4.6 4.4 4.4 5.0  CL 89* 91* 89* 90*  CO2 22 28 28 27   GLUCOSE 137* 118* 155* 97  BUN 62* 18 25* 41*  CREATININE 9.08* 4.24* 5.44* 6.60*  CALCIUM  8.1* 8.8* 8.9 8.3*  PHOS 4.4  --  4.5 4.6   Liver Function Tests: Recent Labs  Lab 02/03/24 1630 02/05/24 1936 02/08/24 0500  ALBUMIN  2.1* 2.4* 2.2*   No results for input(s): LIPASE, AMYLASE in the last 168 hours. CBC: Recent Labs  Lab 02/03/24 1630 02/05/24 0650 02/05/24 1936 02/08/24 0500  WBC 8.9 9.0 9.0 5.9  NEUTROABS  --  6.9  --  4.1  HGB 7.9* 9.7* 9.4* 8.8*  HCT 25.7* 31.3* 31.6* 28.6*   MCV 96.3 96.3 96.6 96.6  PLT 144* 156 168 113*   Blood Culture    Component Value Date/Time   SDES BILE 11/12/2023 1247   SPECREQUEST NONE 11/12/2023 1247   CULT MODERATE STREPTOCOCCUS SALIVARIUS 11/12/2023 1247   REPTSTATUS 11/14/2023 FINAL 11/12/2023 1247    Cardiac Enzymes: No results for input(s): CKTOTAL, CKMB, CKMBINDEX, TROPONINI in the last 168 hours. CBG: Recent Labs  Lab 02/04/24 0631 02/04/24 2110 02/05/24 0559 02/05/24 1126 02/05/24 1706  GLUCAP 100* 121* 108* 134* 124*   Iron  Studies: No results for input(s): IRON , TIBC, TRANSFERRIN, FERRITIN in the last 72 hours. Lab Results  Component Value Date   INR 1.1 01/19/2024   INR 1.3 (H) 11/12/2023   INR 1.4 (H) 11/11/2023   Studies/Results: No results found.  Medications:   (feeding supplement) PROSource Plus  30 mL Oral BID BM   amoxicillin -clavulanate  1 tablet Oral QHS   aspirin  EC  81 mg Oral Daily   atorvastatin   20 mg Oral Daily   carvedilol   3.125 mg Oral BID   Chlorhexidine  Gluconate Cloth  6 each Topical Q0600   clopidogrel   75 mg Oral Daily   feeding supplement (NEPRO CARB STEADY)  237 mL Oral TID BM   gabapentin   200 mg Oral QHS   heparin  injection (subcutaneous)  5,000 Units Subcutaneous Q8H   isosorbide  mononitrate  120 mg Oral Daily   latanoprost   1 drop Both Eyes QHS   levothyroxine   50 mcg Oral Daily   linezolid   600 mg Oral Q12H   multivitamin  1 tablet Oral QHS   nutrition supplement (JUVEN)  1 packet Oral BID BM   sevelamer  carbonate  800 mg Oral TID WC   sodium zirconium cyclosilicate   10 g Oral Once   tamsulosin   0.4 mg Oral QPC breakfast    Dialysis Orders: TTS [facility] 4hr, 350/800, EDW 81kg, 2K/2.5Ca, L AVF, no heparin  - no VDRA, ESA ordered but not yet given  Assessment/Plan: 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 Per VVS. Severe aortic stenosis s/p TAVR 01/20/24: Zio monitor in place. ESRD: HD TTS. Dialyzed  off schedule this week on Tues, then Wed -> next HD 9/9. K+ 5 and Lokelma  X 1 was ordered. Vascular Access: Informed L AVF infiltrated during HD yesterday. Still with (+) B/T and area is mildly bruised. Patient reports difficulty cannulating at his outpatient HD center. Ordered F'gram to be done in IR for tomorrow. Consider VVS referral depending on F'gram results. HTN/volume: Remains euvolemic on exam. BP soft, on low dose BB and isosorbide . Below prior EDW - will lower on discharge. Anemia of ESRD: Hgb 8.8, s/p Aranesp  on 9/3 - monitor. Secondary HPTH: Ca/Phos ok - continue sevelamer  as binder. Nutrition: Alb low, continue supps. Hx cholecystitis with perc chole drain still in place: Erythema/purulence around drain, on PO abx. S/p CT without abscess, and now s/p tube exchange. T2DM Dispo: In CIR.  Charmaine Piety, NP [facility] 02/08/2024,1:08 PM  LOS: 3 days

## 2024-02-08 NOTE — Progress Notes (Signed)
 Physical Therapy Session Note  Patient Details  Name: Phillip [name] MRN: [id number] Date of Birth: Feb 28, 1956  Today's Date: 02/08/2024 PT Individual Time: 0900-0959, 8699-8642 PT Individual Time Calculation (min): 59 min, 57 min  Short Term Goals: Week 1:  PT Short Term Goal 1 (Week 1): Pt will perform basic transfers with min A PT Short Term Goal 2 (Week 1): Pt will be able to perform sit <> stands with min A PT Short Term Goal 3 (Week 1): Pt will be able to initiate gait  Skilled Therapeutic Interventions/Progress Updates:      Treatment Session 1   Pt seated EOB upon arrival. Pt agreeable to therapy. Pt reports L LE 7/10 pain. Nurse present to administer medication at the start of session.   Of note: pt L knee flexed while seated EOB upon arrival. Education provided regarding need to wear L LE limb protector for contracture prevention. Education rpovided of need to donn L LE limb protector prior to performing transfers to protect incision. Pt verbalized understanding and agreeable.   Pt reports urgent need to use the bathroom. Pt incontinent of bladder.   Pt donned L LE limb protector with total A for time conservation.   Pt performed squat pivot transfer bed to WC and WC to BSC with heavy mod A, verbal cues provided for sequencing and technique.   Pt continent of bowel.   Pt performed modified sit to stand with and without RW (pt unable to achieve full upright posture but able to get enough clearance to perform pericare with +2 A . Pt clearance decreased with fatigue. Utilized lateral trunk lean technique to donn pants with total A. PT provided prolonged seated rest breaks for fatigue management.   Dialysis MD present during session and notes change in speech in comparison to pt baseline. PT notified IDT to determine if SLP consult appropriate.   Pt seated in [facility] with all needs within reach and chair alarm on.   Treatment session 2   Pt seated in WC upon arrival. Pt  agreeable to therapy. Pt denies any pain. Pt endorses increased fatigue.   Pt self propelled WC with B UE/R LE with supervision room<>main gym<>nursing west double doors, verbal cues provided for techniuqe.   Education provided for donning/doffing L LE limb protector. Pt required mod A, verbal cues provided for technique/sequencing.   Pt performed seated therex for B LE strength/ROM/contracture prevention/activity tolerance with 1.5# ankle weight on R LE.   1x10 seated marching B  LAQ x10 B  L LE quad set x10 5 hold  Ankle pumps x20 R LE  Glute sets x10 5 hold  WC push up 1x5; limited clearance   Pt seated in WC at end of session with all needs within reach and chair alarm on.        Therapy Documentation Precautions:  Precautions Precautions: Fall Recall of Precautions/Restrictions: Intact Precaution/Restrictions Comments: R chole drain, L BKA Required Braces or Orthoses: Other Brace Other Brace: limb guard Restrictions Weight Bearing Restrictions Per Provider Order: No LLE Weight Bearing Per Provider Order: Non weight bearing   Therapy/Group: Individual Therapy  [facility] Doreene Orris, [place], DPT  02/08/2024, 7:46 AM

## 2024-02-08 NOTE — Progress Notes (Signed)
 Occupational Therapy Session Note  Patient Details  Name: Phillip [name] MRN: [id number] Date of Birth: [birth year]  Today's Date: 02/08/2024 OT Individual Time: 1115-1200 OT Individual Time Calculation (min): 45 min    Short Term Goals: Week 1:  OT Short Term Goal 1 (Week 1): Pt will complete LB bathing with Mod A OT Short Term Goal 2 (Week 1): Pt will complete toilet transfer with Min A OT Short Term Goal 3 (Week 1): Pt will complete LB dressing with Mod A  Skilled Therapeutic Interventions/Progress Updates:      Therapy Documentation Precautions:  Precautions Precautions: Fall Recall of Precautions/Restrictions: Intact Precaution/Restrictions Comments: R chole drain, L BKA Required Braces or Orthoses: Other Brace Other Brace: limb guard Restrictions Weight Bearing Restrictions Per Provider Order: No LLE Weight Bearing Per Provider Order: Non weight bearing General: Pt seated in W/C upon OT arrival, agreeable to OT.  Pain:  8/10 pain reported in Rt knee, activity, intermittent rest breaks, distractions provided for pain management, pt reports tolerable to proceed.   Exercises: Pt completed 10 minutes of arm bike, 5 min forward, 5 min backward in order to increase BUE/BLEstrength and endurance in preparation for increased independence in ADLs such as functional mobility. Rest break after 5 min, on level 6 resistance.  Pt completed the following exercise circuit in order to improve functional activity, strength and endurance to prepare for ADLs such as bathing. Pt completed the following exercises in seated position with no noted LOB/SOB and 3x10 repetitions on each exercise and PRN rest breaks: -resistive knee flexion on LLE  Other Treatments: OT and pt discussing amputee support group.    Pt seated in W/C at end of session with W/C alarm donned, call light within reach and 4Ps assessed. Pt with family present at end of session.      Therapy/Group: Individual  Therapy  Camie Hoe, OTD, OTR/L 02/08/2024, 12:39 PM

## 2024-02-08 NOTE — Progress Notes (Signed)
 PROGRESS NOTE   Subjective/Complaints: Pt up at bedside. Just had breakfast. Says he feels better today. Certainly looks clearer. Pain is better controlled  ROS: Patient denies fever, rash, sore throat, blurred vision, dizziness, nausea, vomiting, diarrhea, cough, shortness of breath or chest pain, back/neck pain, headache, or mood change.   Objective:   No results found.  Recent Labs    02/05/24 1936 02/08/24 0500  WBC 9.0 5.9  HGB 9.4* 8.8*  HCT 31.6* 28.6*  PLT 168 113*   Recent Labs    02/05/24 1936 02/08/24 0500  NA 133* 131*  K 4.4 5.0  CL 89* 90*  CO2 28 27  GLUCOSE 155* 97  BUN 25* 41*  CREATININE 5.44* 6.60*  CALCIUM  8.9 8.3*    Intake/Output Summary (Last 24 hours) at 02/08/2024 0918 Last data filed at 02/08/2024 0726 Gross per 24 hour  Intake 138 ml  Output 0 ml  Net 138 ml        Physical Exam: Vital Signs Blood pressure (!) 100/45, pulse 72, temperature 97.7 F (36.5 C), temperature source Oral, resp. rate 17, height 6' (1.829 m), weight 69.1 kg, SpO2 100%.  Constitutional: No distress . Vital signs reviewed. HEENT: NCAT, EOMI, oral membranes moist Neck: supple Cardiovascular: RRR without murmur. No JVD    Respiratory/Chest: CTA Bilaterally without wheezes or rales. Normal effort    GI/Abdomen: BS +, non-tender, non-distended, perc chole tube Ext: no clubbing, cyanosis, or edema Psych: pleasant and cooperative  Skin: Left aVF, left BK with s/s drainage, gauze, ACE wrap in place Neuro: Alert and oriented to person, place, reasons, month/year. More attentive. Better insight and awareness. Speech clearer. Cranial nerves II through XII grossly intact. Bilateral upper extremity strength 5 out of 5. Right lower extremity strength 4 out of 5 proximal, 5 out of 5 distal. Left lower extremity at least 3-5 Sensation intact light touch in all extremities  Assessment/Plan: 1. Functional deficits  which require 3+ hours per day of interdisciplinary therapy in a comprehensive inpatient rehab setting. Physiatrist is providing close team supervision and 24 hour management of active medical problems listed below. Physiatrist and rehab team continue to assess barriers to discharge/monitor patient progress toward functional and medical goals  Care Tool:  Bathing    Body parts bathed by patient: Right arm, Left arm, Chest, Face, Abdomen, Front perineal area, Right upper leg   Body parts bathed by helper: Buttocks Body parts n/a: Left lower leg   Bathing assist Assist Level: Moderate Assistance - Patient 50 - 74%     Upper Body Dressing/Undressing Upper body dressing   What is the patient wearing?: Pull over shirt    Upper body assist Assist Level: Minimal Assistance - Patient > 75%    Lower Body Dressing/Undressing Lower body dressing      What is the patient wearing?: Pants, Ace wrap/stump shrinker     Lower body assist Assist for lower body dressing: Maximal Assistance - Patient 25 - 49%     Toileting Toileting    Toileting assist Assist for toileting: Maximal Assistance - Patient 25 - 49%     Transfers Chair/bed transfer  Transfers assist     Chair/bed  transfer assist level: Moderate Assistance - Patient 50 - 74%     Locomotion Ambulation   Ambulation assist   Ambulation activity did not occur: Safety/medical concerns          Walk 10 feet activity   Assist  Walk 10 feet activity did not occur: Safety/medical concerns        Walk 50 feet activity   Assist Walk 50 feet with 2 turns activity did not occur: Safety/medical concerns         Walk 150 feet activity   Assist Walk 150 feet activity did not occur: Safety/medical concerns         Walk 10 feet on uneven surface  activity   Assist Walk 10 feet on uneven surfaces activity did not occur: Safety/medical concerns         Wheelchair     Assist Is the patient using  a wheelchair?: Yes Type of Wheelchair: Manual    Wheelchair assist level: Supervision/Verbal cueing Max wheelchair distance: 150'    Wheelchair 50 feet with 2 turns activity    Assist        Assist Level: Supervision/Verbal cueing   Wheelchair 150 feet activity     Assist      Assist Level: Supervision/Verbal cueing   Blood pressure (!) 100/45, pulse 72, temperature 97.7 F (36.5 C), temperature source Oral, resp. rate 17, height 6' (1.829 m), weight 69.1 kg, SpO2 100%.  Medical Problem List and Plan: 1. Functional deficits secondary to left BKA 01/30/2024 after failed TMA with history of PAD status post PCI to left tibia/fibula angioplasty              -patient may  shower             -ELOS/Goals: 7 days with mod I goals for mobility and self-care  -Continue CIR therapies including PT, OT  2.  Antithrombotics: -DVT/anticoagulation:  SQ heparin              -antiplatelet therapy: Aspirin  81 mg daily, Plavix  75 mg daily 3. Pain Management: Oxycodone  as needed             -9/6 pt appears more confused. Was recently changed to lyrica . Will dc   -change back to gabapentin . Only needs to be given once per day in evening.   -9/7 pt looks much better today.  Did the change from lyrica  to gabapentin  help? Perhaps receiving HD?   -continue to observe for now.   -limit neuro-sedating meds as possible 4. Mood/Behavior/Sleep: Provide emotional support             -antipsychotic agents: N/A  - 9/5 melatonin as needed insomnia 5. Neuropsych/cognition: This patient is capable of making decisions on his own behalf.  -9/7pt appears clearer today. See above 6. Skin/Wound Care: Routine skin checks  -local care to stump. Can use ACE for now for compression if he likes 7. Fluids/Electrolytes/Nutrition: Routine in and out with follow-up chemistries 8.  Aortic stenosis.  Status post TAVR 01/20/2024 9.  End-stage renal disease.  Continue hemodialysis as per renal services              -HD at end of day to allow participation in therapy  - HD T TH S 10.  Hyperlipidemia.  Lipitor 11.  Hypertension.  Coreg  3.125 mg twice daily, Imdur  120 mg daily.  Monitor with increased mobility    02/08/2024    5:00 AM 02/07/2024    7:55 PM 02/07/2024  7:34 PM  Vitals with BMI  Weight 152 lbs 5 oz    BMI 20.66    Systolic  100 100  Diastolic  45 45  Pulse  72 72  9/7 controlled at present 12.  Hypothyroidism.  Synthroid  13.  Acute on chronic anemia.  Continue Aranesp .  Follow-up CBC  - 9/7 hemoglobin down to 8.8--follow with HD labs 14.  CAD with history of CABG.  No chest pain or shortness of breath.  Continue Imdur  15.  BPH.  Flomax  0.4 mg daily. 16.  Chronic diastolic congestive heart failure.  Monitor for any signs of fluid overload.  Follow-up per cardiology services.             -daily weights             -volume mgt with HD Filed Weights   02/06/24 0431 02/07/24 1519 02/08/24 0500  Weight: 69.9 kg 69 kg 69.1 kg    17.  Recent acute cholecystitis June 2025.  Currently maintained on Augmentin  and linezolid  for 3 to 4 weeks and will confirm duration of antibiotic.  Radiology following  for percutaneous cholecystostomy tube for recent cholecystitis. Awaiting plan for continued suction drainage versus change to gravity bag.     LOS: 3 days A FACE TO FACE EVALUATION WAS PERFORMED  Arthea ONEIDA Gunther 02/08/2024, 9:18 AM

## 2024-02-09 ENCOUNTER — Inpatient Hospital Stay (HOSPITAL_COMMUNITY)

## 2024-02-09 DIAGNOSIS — Z89512 Acquired absence of left leg below knee: Secondary | ICD-10-CM | POA: Diagnosis not present

## 2024-02-09 HISTORY — PX: IR DIALY SHUNT INTRO NEEDLE/INTRACATH INITIAL W/IMG LEFT: IMG6102

## 2024-02-09 LAB — CBC WITH DIFFERENTIAL/PLATELET
Abs Immature Granulocytes: 0.03 K/uL (ref 0.00–0.07)
Basophils Absolute: 0 K/uL (ref 0.0–0.1)
Basophils Relative: 1 %
Eosinophils Absolute: 0.1 K/uL (ref 0.0–0.5)
Eosinophils Relative: 1 %
HCT: 28.1 % — ABNORMAL LOW (ref 39.0–52.0)
Hemoglobin: 8.7 g/dL — ABNORMAL LOW (ref 13.0–17.0)
Immature Granulocytes: 1 %
Lymphocytes Relative: 16 %
Lymphs Abs: 0.8 K/uL (ref 0.7–4.0)
MCH: 30 pg (ref 26.0–34.0)
MCHC: 31 g/dL (ref 30.0–36.0)
MCV: 96.9 fL (ref 80.0–100.0)
Monocytes Absolute: 0.6 K/uL (ref 0.1–1.0)
Monocytes Relative: 12 %
Neutro Abs: 3.7 K/uL (ref 1.7–7.7)
Neutrophils Relative %: 69 %
Platelets: 103 K/uL — ABNORMAL LOW (ref 150–400)
RBC: 2.9 MIL/uL — ABNORMAL LOW (ref 4.22–5.81)
RDW: 14.5 % (ref 11.5–15.5)
WBC: 5.2 K/uL (ref 4.0–10.5)
nRBC: 0 % (ref 0.0–0.2)

## 2024-02-09 LAB — RENAL FUNCTION PANEL
Albumin: 2.2 g/dL — ABNORMAL LOW (ref 3.5–5.0)
Anion gap: 13 (ref 5–15)
BUN: 69 mg/dL — ABNORMAL HIGH (ref 8–23)
CO2: 25 mmol/L (ref 22–32)
Calcium: 8.2 mg/dL — ABNORMAL LOW (ref 8.9–10.3)
Chloride: 91 mmol/L — ABNORMAL LOW (ref 98–111)
Creatinine, Ser: 8.1 mg/dL — ABNORMAL HIGH (ref 0.61–1.24)
GFR, Estimated: 7 mL/min — ABNORMAL LOW (ref >60)
Glucose, Bld: 110 mg/dL — ABNORMAL HIGH (ref 70–99)
Phosphorus: 5.2 mg/dL — ABNORMAL HIGH (ref 2.5–4.6)
Potassium: 5.2 mmol/L — ABNORMAL HIGH (ref 3.5–5.1)
Sodium: 129 mmol/L — ABNORMAL LOW (ref 135–145)

## 2024-02-09 LAB — PROTIME-INR
INR: 1.1 (ref 0.8–1.2)
Prothrombin Time: 14.3 s (ref 11.4–15.2)

## 2024-02-09 LAB — SURGICAL PATHOLOGY

## 2024-02-09 MED ORDER — IOHEXOL 300 MG/ML  SOLN
100.0000 mL | Freq: Once | INTRAMUSCULAR | Status: AC | PRN
  Administered 2024-02-09: 40 mL via INTRAVENOUS

## 2024-02-09 MED ORDER — FENTANYL CITRATE (PF) 100 MCG/2ML IJ SOLN
INTRAMUSCULAR | Status: AC
  Filled 2024-02-09: qty 2

## 2024-02-09 MED ORDER — ISOSORBIDE MONONITRATE ER 60 MG PO TB24
60.0000 mg | ORAL_TABLET | Freq: Every day | ORAL | Status: DC
Start: 2024-02-10 — End: 2024-02-12
  Administered 2024-02-10 – 2024-02-12 (×3): 60 mg via ORAL
  Filled 2024-02-09 (×3): qty 1

## 2024-02-09 MED ORDER — MIDAZOLAM HCL 2 MG/2ML IJ SOLN
INTRAMUSCULAR | Status: AC
  Filled 2024-02-09: qty 2

## 2024-02-09 NOTE — Plan of Care (Signed)
  Problem: RH Stairs Goal: LTG Patient will ambulate up and down stairs w/assist (PT) Description: LTG: Patient will ambulate up and down # of stairs with assistance (PT) Outcome: Not Applicable Flowsheets (Taken 02/09/2024 1121) LTG: Pt will ambulate up/down stairs assist needed:: (d/c - building a ramp) --

## 2024-02-09 NOTE — Progress Notes (Signed)
 PROGRESS NOTE   Subjective/Complaints: No new complaints this morning Appreciate nephrology following Vitals stable Next HD tomorrow  ROS: Patient denies fever, rash, sore throat, blurred vision, dizziness, nausea, vomiting, diarrhea, cough, shortness of breath or chest pain, back/neck pain, headache, or mood change.   Objective:   IR DIALY SHUNT INTRO NEEDLE/INTRACATH INITIAL W/IMG LEFT Result Date: 02/09/2024 INDICATION: Difficult cannulation, infiltration EXAM: Left upper arm AV fistulagram MEDICATIONS: None. ANESTHESIA/SEDATION: Total intra-service moderate Sedation Time: None. The patient's level of consciousness and vital signs were monitored continuously by radiology nursing throughout the procedure under my direct supervision. FLUOROSCOPY: Radiation Exposure Index (as provided by the fluoroscopic device): 13 mGy Kerma COMPLICATIONS: None immediate. PROCEDURE: Informed written consent was obtained from the patient after a thorough discussion of the procedural risks, benefits and alternatives. All questions were addressed. Maximal Sterile Barrier Technique was utilized including caps, mask, sterile gowns, sterile gloves, sterile drape, hand hygiene and skin antiseptic. A timeout was performed prior to the initiation of the procedure. Under sterile conditions, percutaneous Angiocath access performed of the left upper arm patent brachiocephalic fistula. Contrast injection performed for a complete fistulagram. Arterial anastomosis to the brachial artery at the elbow is widely patent. Outflow cephalic vein is patent in the upper arm and across the shoulder. Patent cephalic arch. Centrally, the subclavian, innominate vein, and SVC are all patent. No significant flow limiting stenosis, occlusion or thrombus. Access removed. Hemostasis obtained with manual compression. No immediate complication. Patient tolerated the procedure well. IMPRESSION:  Patent left upper arm brachiocephalic AV fistula circuit. ACCESS: This access remains amenable to future percutaneous interventions as clinically indicated. Electronically Signed   By: CHRISTELLA.  Shick M.D.   On: 02/09/2024 09:19    Recent Labs    02/08/24 0500 02/09/24 0515  WBC 5.9 5.2  HGB 8.8* 8.7*  HCT 28.6* 28.1*  PLT 113* 103*   Recent Labs    02/08/24 0500 02/09/24 0515  NA 131* 129*  K 5.0 5.2*  CL 90* 91*  CO2 27 25  GLUCOSE 97 110*  BUN 41* 69*  CREATININE 6.60* 8.10*  CALCIUM  8.3* 8.2*    Intake/Output Summary (Last 24 hours) at 02/09/2024 1335 Last data filed at 02/09/2024 1041 Gross per 24 hour  Intake 220 ml  Output --  Net 220 ml        Physical Exam: Vital Signs Blood pressure (!) 104/57, pulse 72, temperature (!) 97.5 F (36.4 C), temperature source Oral, resp. rate 16, height 6' (1.829 m), weight 70 kg, SpO2 100%.  Constitutional: No distress . Vital signs reviewed. HEENT: NCAT, EOMI, oral membranes moist Neck: supple Cardiovascular: RRR without murmur. No JVD    Respiratory/Chest: CTA Bilaterally without wheezes or rales. Normal effort    GI/Abdomen: BS +, non-tender, non-distended, perc chole tube Ext: no clubbing, cyanosis, or edema Psych: pleasant and cooperative  Skin: Left aVF, left BK with s/s drainage, gauze, ACE wrap in place Neuro: Alert and oriented to person, place, reasons, month/year. More attentive. Better insight and awareness. Speech clearer. Cranial nerves II through XII grossly intact. Bilateral upper extremity strength 5 out of 5. Right lower extremity strength 4 out of 5 proximal, 5 out of  5 distal. Left lower extremity at least 3-5, stable 9/8 Sensation intact light touch in all extremities  Assessment/Plan: 1. Functional deficits which require 3+ hours per day of interdisciplinary therapy in a comprehensive inpatient rehab setting. Physiatrist is providing close team supervision and 24 hour management of active medical problems  listed below. Physiatrist and rehab team continue to assess barriers to discharge/monitor patient progress toward functional and medical goals  Care Tool:  Bathing    Body parts bathed by patient: Right arm, Left arm, Chest, Face, Abdomen, Front perineal area, Right upper leg   Body parts bathed by helper: Buttocks Body parts n/a: Left lower leg   Bathing assist Assist Level: Moderate Assistance - Patient 50 - 74%     Upper Body Dressing/Undressing Upper body dressing   What is the patient wearing?: Pull over shirt    Upper body assist Assist Level: Minimal Assistance - Patient > 75%    Lower Body Dressing/Undressing Lower body dressing      What is the patient wearing?: Pants, Ace wrap/stump shrinker     Lower body assist Assist for lower body dressing: Maximal Assistance - Patient 25 - 49%     Toileting Toileting    Toileting assist Assist for toileting: Maximal Assistance - Patient 25 - 49%     Transfers Chair/bed transfer  Transfers assist     Chair/bed transfer assist level: Moderate Assistance - Patient 50 - 74%     Locomotion Ambulation   Ambulation assist   Ambulation activity did not occur: Safety/medical concerns          Walk 10 feet activity   Assist  Walk 10 feet activity did not occur: Safety/medical concerns        Walk 50 feet activity   Assist Walk 50 feet with 2 turns activity did not occur: Safety/medical concerns         Walk 150 feet activity   Assist Walk 150 feet activity did not occur: Safety/medical concerns         Walk 10 feet on uneven surface  activity   Assist Walk 10 feet on uneven surfaces activity did not occur: Safety/medical concerns         Wheelchair     Assist Is the patient using a wheelchair?: Yes Type of Wheelchair: Manual    Wheelchair assist level: Supervision/Verbal cueing Max wheelchair distance: 150'    Wheelchair 50 feet with 2 turns activity    Assist         Assist Level: Supervision/Verbal cueing   Wheelchair 150 feet activity     Assist      Assist Level: Supervision/Verbal cueing   Blood pressure (!) 104/57, pulse 72, temperature (!) 97.5 F (36.4 C), temperature source Oral, resp. rate 16, height 6' (1.829 m), weight 70 kg, SpO2 100%.  Medical Problem List and Plan: 1. Functional deficits secondary to left BKA 01/30/2024 after failed TMA with history of PAD status post PCI to left tibia/fibula angioplasty              -patient may  shower             -ELOS/Goals: 7 days with mod I goals for mobility and self-care  -Continue CIR therapies including PT, OT   Grounds pass ordered  2.  Anemia: Hgb reviewed and is downtrending, repeat CBC ordered for tomorrow -DVT/anticoagulation:  SQ heparin              -antiplatelet therapy: Aspirin  81 mg daily,  Plavix  75 mg daily  3. Pain Management: Oxycodone  as needed Continue gabapentin   4. Insomnia: continue prn melatonin  5. Neuropsych/cognition: This patient is capable of making decisions on his own behalf.  -9/7pt appears clearer today. See above  6. Skin/Wound Care: Routine skin checks  -local care to stump. Can use ACE for now for compression if he likes 7. Fluids/Electrolytes/Nutrition: Routine in and out with follow-up chemistries 8.  Aortic stenosis.  Status post TAVR 01/20/2024 9.  End-stage renal disease.  Continue hemodialysis as per renal services             -HD at end of day to allow participation in therapy  - HD T TH S 10.  Hyperlipidemia.  Lipitor 11.  Hypotension.  Coreg  3.125 mg twice daily, decrease imdur  to 60mg  daily.  Monitor with increased mobility    02/09/2024    1:07 PM 02/09/2024    8:45 AM 02/09/2024    8:40 AM  Vitals with BMI  Systolic 104 113 887  Diastolic 57 61 62  Pulse 72 68 66   12.  Hypothyroidism.  Synthroid  13.  Acute on chronic anemia.  Continue Aranesp .  Follow-up CBC  - 9/7 hemoglobin down to 8.8--follow with HD labs 14.  CAD with  history of CABG.  No chest pain or shortness of breath.  Continue Imdur  15.  BPH.  Flomax  0.4 mg daily. 16.  Chronic diastolic congestive heart failure.  Monitor for any signs of fluid overload.  Follow-up per cardiology services.             -daily weights             -volume mgt with HD Filed Weights   02/07/24 1519 02/08/24 0500 02/09/24 0500  Weight: 69 kg 69.1 kg 70 kg    17.  Recent acute cholecystitis June 2025.  Currently maintained on Augmentin  and linezolid  for 3 to 4 weeks and will confirm duration of antibiotic.  Radiology following  for percutaneous cholecystostomy tube for recent cholecystitis. Awaiting plan for continued suction drainage versus change to gravity bag.     LOS: 4 days A FACE TO FACE EVALUATION WAS PERFORMED  Sven P Heather Streeper 02/09/2024, 1:35 PM

## 2024-02-09 NOTE — Progress Notes (Signed)
 Occupational Therapy Session Note  Patient Details  Name: Phillip [name] MRN: [id number] Date of Birth: 1956-01-31  Today's Date: 02/09/2024 OT Individual Time: 9084-8969 OT Individual Time Calculation (min): 75 min    Short Term Goals: Week 1:  OT Short Term Goal 1 (Week 1): Pt will complete LB bathing with Mod A OT Short Term Goal 2 (Week 1): Pt will complete toilet transfer with Min A OT Short Term Goal 3 (Week 1): Pt will complete LB dressing with Mod A  Skilled Therapeutic Interventions/Progress Updates:  Skilled OT session completed to address ADL retraining and residual limb care. Pt received supine, agreeable to participate in therapy. Pt reports no pain upon arrival, but pain in LLE with activity. OT provided pain intervention by providing repoisitoning and rest breaks.  Supine>EOB with HOB elevated CGA. Pt declines shower and sink level ADLs and requests to perform EOB. UB don/doff clothing performed with supervision, VC to manage R chole drain. Increase discharge noted from drain, nursing notified and changed dressing. UB/LB bathing performed with Min A to wash RLE. Pt completed LB dressing with Mod A, performs task with lateral leaning to pull up/down underwear. Pt requests privacy when performing peri care. Max A to don footwear at RLE. OT requested pt recall with BKA precautions, can recall WB status, limited recall on skin care and shrinker care. OT performed shrinker care in room and hanging to air dry in bathroom.  Education provided on replacing shrinker daily and hand washing. Removed ace wrap to allow shrinker to have direct contact with skin. Dehiscence and mild drainage noted. Mod A to don shrinker and limb guard, pt able to pull shrinker up once at knee level and strap in limb guard. EOB>WC SB Min A, VC required for safe hand placement on SB during transfer. Pt completed oral care seated in [facility] with set up A to open toothpaste. Pt required set up A to open containers for  meal.   At end of session, Pt seated in [facility] with alarms on and all needs met.    Therapy Documentation Precautions:  Precautions Precautions: Fall Recall of Precautions/Restrictions: Intact Precaution/Restrictions Comments: R chole drain, L BKA Required Braces or Orthoses: Other Brace Other Brace: limb guard Restrictions Weight Bearing Restrictions Per Provider Order: No LLE Weight Bearing Per Provider Order: Non weight bearing   Therapy/Group: Individual Therapy  Orlan Aversa Woods-Chance, MS, OTR/L 02/09/2024, 7:58 AM

## 2024-02-09 NOTE — Progress Notes (Signed)
  [facility] Progress Note   Subjective:  Seen in room - s/p normal f'gram this AM. No CP/dyspnea.  Objective Vitals:   02/09/24 0830 02/09/24 0835 02/09/24 0840 02/09/24 0845  BP: 104/60 107/60 112/62 113/61  Pulse: 68 68 66 68  Resp: 11 13 11 11   Temp:      TempSrc:      SpO2: 99% 99% 100% 100%  Weight:      Height:       Physical Exam General: Well appearing man, NAD Heart: RRR Lungs: CTAB Abdomen: soft, R per chole tube in place Extremities: L BKA, no LE edema Dialysis Access: LUE AVF +t/b  Additional Objective Labs: Basic Metabolic Panel: Recent Labs  Lab 02/05/24 1936 02/08/24 0500 02/09/24 0515  NA 133* 131* 129*  K 4.4 5.0 5.2*  CL 89* 90* 91*  CO2 28 27 25   GLUCOSE 155* 97 110*  BUN 25* 41* 69*  CREATININE 5.44* 6.60* 8.10*  CALCIUM  8.9 8.3* 8.2*  PHOS 4.5 4.6 5.2*   Liver Function Tests: Recent Labs  Lab 02/05/24 1936 02/08/24 0500 02/09/24 0515  ALBUMIN  2.4* 2.2* 2.2*   CBC: Recent Labs  Lab 02/03/24 1630 02/05/24 0650 02/05/24 1936 02/08/24 0500 02/09/24 0515  WBC 8.9 9.0 9.0 5.9 5.2  NEUTROABS  --  6.9  --  4.1 3.7  HGB 7.9* 9.7* 9.4* 8.8* 8.7*  HCT 25.7* 31.3* 31.6* 28.6* 28.1*  MCV 96.3 96.3 96.6 96.6 96.9  PLT 144* 156 168 113* 103*   Medications:   (feeding supplement) PROSource Plus  30 mL Oral BID BM   amoxicillin -clavulanate  1 tablet Oral QHS   aspirin  EC  81 mg Oral Daily   atorvastatin   20 mg Oral Daily   carvedilol   3.125 mg Oral BID   clopidogrel   75 mg Oral Daily   feeding supplement (NEPRO CARB STEADY)  237 mL Oral TID BM   gabapentin   200 mg Oral QHS   heparin  injection (subcutaneous)  5,000 Units Subcutaneous Q8H   isosorbide  mononitrate  120 mg Oral Daily   latanoprost   1 drop Both Eyes QHS   levothyroxine   50 mcg Oral Daily   linezolid   600 mg Oral Q12H   multivitamin  1 tablet Oral QHS   nutrition supplement (JUVEN)  1 packet Oral BID BM   sevelamer  carbonate  800 mg Oral TID WC    tamsulosin   0.4 mg Oral QPC breakfast    Dialysis Orders TTS [facility] 4hr, 350/800, EDW 81kg, 2K/2.5Ca, L AVF, no heparin  - no VDRA, ESA ordered but not yet given   Assessment/Plan: 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 Per [facility]. Severe aortic stenosis s/p TAVR 01/20/24: Zio monitor in place. ESRD: HD TTS. Next HD tomorrow. Vascular Access: S/p AVF infiltration and difficulty cannulating at his outpatient HD center. F'gram completed this AM - normal/patent AVF. HTN/volume: BP soft, on low dose BB and isosorbide . Well below EDW, lower on discharge. Anemia of ESRD: Hgb 8.7, s/p Aranesp  on 9/3 - monitor. Secondary HPTH: Ca/Phos ok - continue sevelamer  as binder. Nutrition: Alb low, continue supps. Hx cholecystitis with perc chole drain still in place: Erythema/purulence around drain, on PO abx. S/p CT without abscess, and now s/p tube exchange. T2DM Dispo: In CIR.   Izetta Boehringer, PA-C 02/09/2024, 9:15 AM  [employer]

## 2024-02-09 NOTE — Progress Notes (Signed)
 Physical Therapy Session Note  Patient Details  Name: Phillip [name] MRN: [id number] Date of Birth: 1956/03/19  Today's Date: 02/09/2024 PT Individual Time: 8954-8843 PT Individual Time Calculation (min): 71 min   Short Term Goals: Week 1:  PT Short Term Goal 1 (Week 1): Pt will perform basic transfers with min A PT Short Term Goal 2 (Week 1): Pt will be able to perform sit <> stands with min A PT Short Term Goal 3 (Week 1): Pt will be able to initiate gait  Skilled Therapeutic Interventions/Progress Updates:    W/c propulsion with BUE for functional strengthening and endurance x 150' with 2 rest breaks  needed due to fatigue. Education and return demonstration on w/c parts management training for set up for transfers to set up for transfer and then after to get back to w/c - will benefit from further practice as required assist and cues to perform effectively. Squat pivot transfers training with focus on technique with mod A and cues for head/hips relationship. Scooting on Edge of mat for carryover to transfer technique with emphasis on lifting up bottom vs scooting to R and L.  Supine therex for functional strengthening, ROM and muscular endurance for carryover for transfers. Quad sets on L x 10 reps x 5 sec hold x 2 sets Hip abduction x 10 reps x 2 sets BLE Knee/hip flexion x 10 reps x 2 sets BLE Chest press with 5# straight weight x 20 reps Modified bridge x 10 reps Sidelying hip abduction/extension x 10 reps - limited by increased pain in residual limb   HEP handout issued; Access Code: [facility] URL: [email or web address] Date: 02/09/2024 Prepared by: Donald  Exercises - Supine Quad Set (BKA)  - 1 x daily - 7 x weekly - 3 sets - 10 reps - Supine Knee to Chest (BKA)  - 1 x daily - 7 x weekly - 3 sets - 10 reps - Supine Single Leg Bridge with Sound Leg (BKA)  - 1 x daily - 7 x weekly - 3 sets - 10 reps - Seated Knee Extension (BKA)  - 1 x daily - 7 x weekly - 3  sets - 10 reps - Sidelying Hip Abduction (BKA)  - 1 x daily - 7 x weekly - 3 sets - 10 reps   Therapy Documentation Precautions:  Precautions Precautions: Fall Recall of Precautions/Restrictions: Intact Precaution/Restrictions Comments: R chole drain, L BKA Required Braces or Orthoses: Other Brace Other Brace: limb guard Restrictions Weight Bearing Restrictions Per Provider Order: No LLE Weight Bearing Per Provider Order: Non weight bearing  Pain: Pain in residual limb (L) - premedicated per report. Repositioned and rest break as needed.    Therapy/Group: Individual Therapy  Elnor Donald Sherrell Donald WENDI Elnor, PT, DPT, CBIS  02/09/2024, 12:01 PM

## 2024-02-09 NOTE — Procedures (Signed)
 Interventional Radiology Procedure Note  Procedure: LUE FISTULOGRAM    Complications: None  Estimated Blood Loss:  MIN  Findings: PATENT FISTULA, NO STENOSIS    M. TREVOR Searra Carnathan, MD

## 2024-02-09 NOTE — Progress Notes (Signed)
 Physical Therapy Session Note  Patient Details  Name: Phillip [name] MRN: [id number] Date of Birth: 09-18-1955  Today's Date: 02/09/2024 PT Missed Time: 45 Minutes Missed Time Reason: Unavailable (Comment) (off unit for L UE fistulogram)  Short Term Goals: Week 1:  PT Short Term Goal 1 (Week 1): Pt will perform basic transfers with min A PT Short Term Goal 2 (Week 1): Pt will be able to perform sit <> stands with min A PT Short Term Goal 3 (Week 1): Pt will be able to initiate gait  Skilled Therapeutic Interventions/Progress Updates:     Pt missed 45 minutes 2/2 pt off ancillary unit for LUE fistulogram  Therapy Documentation Precautions:  Precautions Precautions: Fall Recall of Precautions/Restrictions: Intact Precaution/Restrictions Comments: R chole drain, L BKA Required Braces or Orthoses: Other Brace Other Brace: limb guard Restrictions Weight Bearing Restrictions Per Provider Order: No LLE Weight Bearing Per Provider Order: Non weight bearing  Therapy/Group: Individual Therapy  [facility] Doreene Orris, [place], DPT  02/09/2024, 7:58 AM

## 2024-02-10 DIAGNOSIS — Z89512 Acquired absence of left leg below knee: Secondary | ICD-10-CM | POA: Diagnosis not present

## 2024-02-10 LAB — CBC WITH DIFFERENTIAL/PLATELET
Abs Immature Granulocytes: 0.02 K/uL (ref 0.00–0.07)
Basophils Absolute: 0 K/uL (ref 0.0–0.1)
Basophils Relative: 1 %
Eosinophils Absolute: 0.1 K/uL (ref 0.0–0.5)
Eosinophils Relative: 2 %
HCT: 25.3 % — ABNORMAL LOW (ref 39.0–52.0)
Hemoglobin: 8.3 g/dL — ABNORMAL LOW (ref 13.0–17.0)
Immature Granulocytes: 0 %
Lymphocytes Relative: 17 %
Lymphs Abs: 0.9 K/uL (ref 0.7–4.0)
MCH: 31.3 pg (ref 26.0–34.0)
MCHC: 32.8 g/dL (ref 30.0–36.0)
MCV: 95.5 fL (ref 80.0–100.0)
Monocytes Absolute: 0.6 K/uL (ref 0.1–1.0)
Monocytes Relative: 11 %
Neutro Abs: 3.6 K/uL (ref 1.7–7.7)
Neutrophils Relative %: 69 %
Platelets: 83 K/uL — ABNORMAL LOW (ref 150–400)
RBC: 2.65 MIL/uL — ABNORMAL LOW (ref 4.22–5.81)
RDW: 14.5 % (ref 11.5–15.5)
WBC: 5.3 K/uL (ref 4.0–10.5)
nRBC: 0 % (ref 0.0–0.2)

## 2024-02-10 LAB — RENAL FUNCTION PANEL
Albumin: 2.2 g/dL — ABNORMAL LOW (ref 3.5–5.0)
Anion gap: 17 — ABNORMAL HIGH (ref 5–15)
BUN: 95 mg/dL — ABNORMAL HIGH (ref 8–23)
CO2: 23 mmol/L (ref 22–32)
Calcium: 8 mg/dL — ABNORMAL LOW (ref 8.9–10.3)
Chloride: 89 mmol/L — ABNORMAL LOW (ref 98–111)
Creatinine, Ser: 9.37 mg/dL — ABNORMAL HIGH (ref 0.61–1.24)
GFR, Estimated: 6 mL/min — ABNORMAL LOW (ref >60)
Glucose, Bld: 91 mg/dL (ref 70–99)
Phosphorus: 5.7 mg/dL — ABNORMAL HIGH (ref 2.5–4.6)
Potassium: 5.2 mmol/L — ABNORMAL HIGH (ref 3.5–5.1)
Sodium: 129 mmol/L — ABNORMAL LOW (ref 135–145)

## 2024-02-10 NOTE — Progress Notes (Signed)
 Pt in HD at time of rounding. Will place patient on list for rounding on 9/10 to eval perc chole site.

## 2024-02-10 NOTE — Progress Notes (Signed)
 Occupational Therapy Session Note  Patient Details  Name: CHOICE KLEINSASSER MRN: [id number] Date of Birth: 1956-03-14  Today's Date: 02/10/2024 OT Individual Time: 1120-1204 OT Individual Time Calculation (min): 44 min    Short Term Goals: Week 1:  OT Short Term Goal 1 (Week 1): Pt will complete LB bathing with Mod A OT Short Term Goal 2 (Week 1): Pt will complete toilet transfer with Min A OT Short Term Goal 3 (Week 1): Pt will complete LB dressing with Mod A  Skilled Therapeutic Interventions/Progress Updates:  Skilled OT session completed to address ADL retraining. Pt received seated in WC, agreeable to participate in therapy. Pt reports 7/10 pain in LLE, OT requested pain medication from nursing and provided rest breaks throughout session.   Pt requested to complete LB dressing and bathing at bedside for ease of donning and doffing pants. WC>EOB with SB Min A. Pt completes LB bathing with set up. Max A to doff shoes and sock on RLE. Pt doffs shorts with Mod-I, dons shorts and underwear with Min A to pull shorts up in back. Pt performs task with lateral weight shiftting. Pt removes limb guard at EOB with Min A d/t pain with removal. Pt declined need to void. EOB>Supine on flat bed independently to prep for dialysis appointment.   Pt in supine with all needs in reach and bed alarms on.   Therapy Documentation Precautions:  Precautions Precautions: Fall Recall of Precautions/Restrictions: Intact Precaution/Restrictions Comments: R chole drain, L BKA Required Braces or Orthoses: Other Brace Other Brace: limb guard Restrictions Weight Bearing Restrictions Per Provider Order: No LLE Weight Bearing Per Provider Order: Non weight bearing   Therapy/Group: Individual Therapy  Diannia Hogenson [place], MS, OTR/L 02/10/2024, 8:07 AM

## 2024-02-10 NOTE — Progress Notes (Signed)
 Occupational Therapy Session Note  Patient Details  Name: Phillip [name] MRN: [id number] Date of Birth: Mar 22, 1956  Today's Date: 02/10/2024 OT Individual Time: 9067-8953 OT Individual Time Calculation (min): 74 min    Short Term Goals: Week 1:  OT Short Term Goal 1 (Week 1): Pt will complete LB bathing with Mod A OT Short Term Goal 2 (Week 1): Pt will complete toilet transfer with Min A OT Short Term Goal 3 (Week 1): Pt will complete LB dressing with Mod A  Skilled Therapeutic Interventions/Progress Updates:  Patient agreeable to participate in OT session. Reports 4/10 pain level.   Patient participated in skilled OT session focusing on UB bathing and dressing, functional mobility. Completed bathing and dressing with SUP A sitting at sink. Completed slide board transfer training educating on placement, positioning, and mobility. Completed transfer with min A, placement with max A to total A to increase proficiency in ability to complete mobility ind. Completed sit to stand x3 in parallel bars with mod to max for increased LE strength and balance. Sit to stand with RW maxA. Patient anxiety with sit to stand limiting ability to complete LB bathing and dressing.  Patient requiring encouragement to facilitate proper positioning and posture during standing activities. Patient  returned to room alarm on all needs in reach.   Therapy Documentation Precautions:  Precautions Precautions: Fall Recall of Precautions/Restrictions: Intact Precaution/Restrictions Comments: R chole drain, L BKA Required Braces or Orthoses: Other Brace Other Brace: limb guard Restrictions Weight Bearing Restrictions Per Provider Order: No LLE Weight Bearing Per Provider Order: Non weight bearing  Therapy/Group: Individual Therapy  D'mariea L Tayon Parekh 02/10/2024, 6:56 AM

## 2024-02-10 NOTE — Progress Notes (Signed)
 Physical Therapy Session Note  Patient Details  Name: Phillip [name] MRN: [id number] Date of Birth: February 16, 1956  Today's Date: 02/10/2024 PT Individual Time: 0731-0840 PT Individual Time Calculation (min): 69 min   Short Term Goals: Week 1:  PT Short Term Goal 1 (Week 1): Pt will perform basic transfers with min A PT Short Term Goal 2 (Week 1): Pt will be able to perform sit <> stands with min A PT Short Term Goal 3 (Week 1): Pt will be able to initiate gait  Skilled Therapeutic Interventions/Progress Updates:   Received pt sitting EOB, pt agreeable to PT treatment, and reported pain 7/10 in L residual limb - RN notified and present to administer pain medication. Session with emphasis on limb loss education, functional mobility/transfers, generalized strengthening and endurance, and standing balance/coordination. Pt removed 75% of old shrinker without assist and increased time, but unable to completley remove without assist due to pain. Noted incision to be healing well with minimal drainage. Donned clean 4XL shrinker with total A and hand washed dirty shrinker and laid to air dry while educating pt on shrinker care guidelines. Donned R shoe with max A and limb guard sitting EOB with mod A and increased time.   Pt transferred bed<>WC to R via squat<>pivot with 2 scoots and heavy min A with cues for technique. Pt sat in WC at sink and brushed teeth with setup assist. Pt then performed WC mobility 164ft using BUE and supervision to main therapy gym with emphasis on UE strength/coordination (pt required 1 rest break due to fatigue). Pt stood in // bars x 3 trials - trial 1: pt unable to clear buttocks despite max/total A, trial 2: pt stood with mod A +2, trial 3: pt stood with max A of 1. Pt required cues to scoot to edge of chair, for R foot placement, and anterior weight shifting. In standing pt required cues for hip/trunk extension, elbow extension, and R knee extension but only able to remain  standing ~10 seconds before requesting to sit.   Pt reported 9/10 fatigue after standing - transitioned to seated L knee extension 2x15 (pt lacking full knee extension ROM). Practiced donning/doffing limb guard again, this time much faster but still with mod A. Returned to room and concluded session with pt sitting in WC, needs within reach, and seatbelt alarm on.    Therapy Documentation Precautions:  Precautions Precautions: Fall Recall of Precautions/Restrictions: Intact Precaution/Restrictions Comments: R chole drain, L BKA Required Braces or Orthoses: Other Brace Other Brace: limb guard Restrictions Weight Bearing Restrictions Per Provider Order: No LLE Weight Bearing Per Provider Order: Non weight bearing  Therapy/Group: Individual Therapy [name] Therisa Stains PT, DPT 02/10/2024, 7:08 AM

## 2024-02-10 NOTE — Progress Notes (Signed)
   02/10/24 1655  Vitals  Temp 97.7 F (36.5 C)  Pulse Rate 78  Resp 15  BP 116/63  SpO2 100 %  Weight 72.6 kg  Type of Weight Post-Dialysis  Oxygen Therapy  Patient Activity (if Appropriate) In bed  Pulse Oximetry Type Continuous  Oximetry Probe Site Changed No  Post Treatment  Dialyzer Clearance Lightly streaked  Hemodialysis Intake (mL) 0 mL  Liters Processed 84  Fluid Removed (mL) 500 mL  Tolerated HD Treatment Yes  AVG/AVF Arterial Site Held (minutes) 10 minutes  AVG/AVF Venous Site Held (minutes) 10 minutes   Received patient in bed to unit.  Alert and oriented.  Informed consent signed and in chart.   TX duration:3.5hrs  Patient tolerated well.  Transported back to the room  Alert, without acute distress.  Hand-off given to patient's nurse.   Access used: LAVF Access issues: none  Total UF removed: Medication(s) given: none   [facility]

## 2024-02-10 NOTE — Progress Notes (Signed)
 [facility] Progress Note   Subjective:    Seen and examined patient at bedside. Currently working with PT. He reports feeling well with no complaints. S/p F'gram yesterday which showed no stenosis. Plan for HD later this afternoon.  Objective Vitals:   02/09/24 1307 02/09/24 1932 02/10/24 0459 02/10/24 0500  BP: (!) 104/57 (!) 105/55 (!) 99/50   Pulse: 72 84 71   Resp: 16 17 16    Temp: (!) 97.5 F (36.4 C) 98.7 F (37.1 C) 97.8 F (36.6 C)   TempSrc: Oral Oral Oral   SpO2: 100% 100% 98%   Weight:    71.4 kg  Height:       Physical Exam General: Well appearing man, NAD Heart: RRR Lungs: CTAB Abdomen: soft, R per chole tube in place Extremities: L BKA, no LE edema Dialysis Access: LUE AVF +t/b  Filed Weights   02/08/24 0500 02/09/24 0500 02/10/24 0500  Weight: 69.1 kg 70 kg 71.4 kg    Intake/Output Summary (Last 24 hours) at 02/10/2024 1227 Last data filed at 02/10/2024 1052 Gross per 24 hour  Intake 378 ml  Output 10 ml  Net 368 ml    Additional Objective Labs: Basic Metabolic Panel: Recent Labs  Lab 02/05/24 1936 02/08/24 0500 02/09/24 0515  NA 133* 131* 129*  K 4.4 5.0 5.2*  CL 89* 90* 91*  CO2 28 27 25   GLUCOSE 155* 97 110*  BUN 25* 41* 69*  CREATININE 5.44* 6.60* 8.10*  CALCIUM  8.9 8.3* 8.2*  PHOS 4.5 4.6 5.2*   Liver Function Tests: Recent Labs  Lab 02/05/24 1936 02/08/24 0500 02/09/24 0515  ALBUMIN  2.4* 2.2* 2.2*   No results for input(s): LIPASE, AMYLASE in the last 168 hours. CBC: Recent Labs  Lab 02/05/24 0650 02/05/24 1936 02/08/24 0500 02/09/24 0515 02/10/24 0444  WBC 9.0 9.0 5.9 5.2 5.3  NEUTROABS 6.9  --  4.1 3.7 3.6  HGB 9.7* 9.4* 8.8* 8.7* 8.3*  HCT 31.3* 31.6* 28.6* 28.1* 25.3*  MCV 96.3 96.6 96.6 96.9 95.5  PLT 156 168 113* 103* 83*   Blood Culture    Component Value Date/Time   SDES BILE 11/12/2023 1247   SPECREQUEST NONE 11/12/2023 1247   CULT MODERATE STREPTOCOCCUS SALIVARIUS 11/12/2023 1247    REPTSTATUS 11/14/2023 FINAL 11/12/2023 1247    Cardiac Enzymes: No results for input(s): CKTOTAL, CKMB, CKMBINDEX, TROPONINI in the last 168 hours. CBG: Recent Labs  Lab 02/04/24 0631 02/04/24 2110 02/05/24 0559 02/05/24 1126 02/05/24 1706  GLUCAP 100* 121* 108* 134* 124*   Iron  Studies: No results for input(s): IRON , TIBC, TRANSFERRIN, FERRITIN in the last 72 hours. Lab Results  Component Value Date   INR 1.1 02/09/2024   INR 1.1 01/19/2024   INR 1.3 (H) 11/12/2023   Studies/Results: IR DIALY SHUNT INTRO NEEDLE/INTRACATH INITIAL W/IMG LEFT Result Date: 02/09/2024 INDICATION: Difficult cannulation, infiltration EXAM: Left upper arm AV fistulagram MEDICATIONS: None. ANESTHESIA/SEDATION: Total intra-service moderate Sedation Time: None. The patient's level of consciousness and vital signs were monitored continuously by radiology nursing throughout the procedure under my direct supervision. FLUOROSCOPY: Radiation Exposure Index (as provided by the fluoroscopic device): 13 mGy Kerma COMPLICATIONS: None immediate. PROCEDURE: Informed written consent was obtained from the patient after a thorough discussion of the procedural risks, benefits and alternatives. All questions were addressed. Maximal Sterile Barrier Technique was utilized including caps, mask, sterile gowns, sterile gloves, sterile drape, hand hygiene and skin antiseptic. A timeout was performed prior to the initiation of the procedure. Under sterile  conditions, percutaneous Angiocath access performed of the left upper arm patent brachiocephalic fistula. Contrast injection performed for a complete fistulagram. Arterial anastomosis to the brachial artery at the elbow is widely patent. Outflow cephalic vein is patent in the upper arm and across the shoulder. Patent cephalic arch. Centrally, the subclavian, innominate vein, and SVC are all patent. No significant flow limiting stenosis, occlusion or thrombus. Access  removed. Hemostasis obtained with manual compression. No immediate complication. Patient tolerated the procedure well. IMPRESSION: Patent left upper arm brachiocephalic AV fistula circuit. ACCESS: This access remains amenable to future percutaneous interventions as clinically indicated. Electronically Signed   By: CHRISTELLA.  Shick M.D.   On: 02/09/2024 09:19    Medications:   (feeding supplement) PROSource Plus  30 mL Oral BID BM   amoxicillin -clavulanate  1 tablet Oral QHS   aspirin  EC  81 mg Oral Daily   atorvastatin   20 mg Oral Daily   carvedilol   3.125 mg Oral BID   clopidogrel   75 mg Oral Daily   feeding supplement (NEPRO CARB STEADY)  237 mL Oral TID BM   gabapentin   200 mg Oral QHS   heparin  injection (subcutaneous)  5,000 Units Subcutaneous Q8H   isosorbide  mononitrate  60 mg Oral Daily   latanoprost   1 drop Both Eyes QHS   levothyroxine   50 mcg Oral Daily   linezolid   600 mg Oral Q12H   multivitamin  1 tablet Oral QHS   nutrition supplement (JUVEN)  1 packet Oral BID BM   sevelamer  carbonate  800 mg Oral TID WC   tamsulosin   0.4 mg Oral QPC breakfast    Dialysis Orders: TTS [facility] 4hr, 350/800, EDW 81kg, 2K/2.5Ca, L AVF, no heparin  - no VDRA, ESA ordered but not yet given  Assessment/Plan: 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 Per [facility]VS. Severe aortic stenosis s/p TAVR 01/20/24: Zio monitor in place. ESRD: HD TTS. Next HD this afternoon. Vascular Access: S/p AVF infiltration and difficulty cannulating at his outpatient HD center. F'gram completed yesterday - normal/patent AVF. HTN/volume: BP soft, on low dose BB and isosorbide . Well below EDW, lower on discharge. Anemia of ESRD: Hgb 8.7, s/p Aranesp  on 9/3 - monitor. Secondary HPTH: Ca/Phos ok - continue sevelamer  as binder. Nutrition: Alb low, continue supps. Hx cholecystitis with perc chole drain still in place: Erythema/purulence around drain, on PO abx. S/p CT without  abscess, and now s/p tube exchange. T2DM Dispo: In CIR.  Charmaine Piety, NP [facility] 02/10/2024,12:27 PM  LOS: 5 days

## 2024-02-10 NOTE — Progress Notes (Signed)
 PROGRESS NOTE   Subjective/Complaints: Chole tube site irritated: IR consulted +speech impairment: SLP ordered No complaints this morning BP reviewed and continues to be soft  ROS: Patient denies fever, rash, sore throat, blurred vision, dizziness, nausea, vomiting, diarrhea, cough, shortness of breath or chest pain, back/neck pain, headache, or mood change.   Objective:   IR DIALY SHUNT INTRO NEEDLE/INTRACATH INITIAL W/IMG LEFT Result Date: 02/09/2024 INDICATION: Difficult cannulation, infiltration EXAM: Left upper arm AV fistulagram MEDICATIONS: None. ANESTHESIA/SEDATION: Total intra-service moderate Sedation Time: None. The patient's level of consciousness and vital signs were monitored continuously by radiology nursing throughout the procedure under my direct supervision. FLUOROSCOPY: Radiation Exposure Index (as provided by the fluoroscopic device): 13 mGy Kerma COMPLICATIONS: None immediate. PROCEDURE: Informed written consent was obtained from the patient after a thorough discussion of the procedural risks, benefits and alternatives. All questions were addressed. Maximal Sterile Barrier Technique was utilized including caps, mask, sterile gowns, sterile gloves, sterile drape, hand hygiene and skin antiseptic. A timeout was performed prior to the initiation of the procedure. Under sterile conditions, percutaneous Angiocath access performed of the left upper arm patent brachiocephalic fistula. Contrast injection performed for a complete fistulagram. Arterial anastomosis to the brachial artery at the elbow is widely patent. Outflow cephalic vein is patent in the upper arm and across the shoulder. Patent cephalic arch. Centrally, the subclavian, innominate vein, and SVC are all patent. No significant flow limiting stenosis, occlusion or thrombus. Access removed. Hemostasis obtained with manual compression. No immediate complication. Patient  tolerated the procedure well. IMPRESSION: Patent left upper arm brachiocephalic AV fistula circuit. ACCESS: This access remains amenable to future percutaneous interventions as clinically indicated. Electronically Signed   By: CHRISTELLA.  Shick M.D.   On: 02/09/2024 09:19    Recent Labs    02/09/24 0515 02/10/24 0444  WBC 5.2 5.3  HGB 8.7* 8.3*  HCT 28.1* 25.3*  PLT 103* 83*   Recent Labs    02/08/24 0500 02/09/24 0515  NA 131* 129*  K 5.0 5.2*  CL 90* 91*  CO2 27 25  GLUCOSE 97 110*  BUN 41* 69*  CREATININE 6.60* 8.10*  CALCIUM  8.3* 8.2*    Intake/Output Summary (Last 24 hours) at 02/10/2024 1332 Last data filed at 02/10/2024 1052 Gross per 24 hour  Intake 378 ml  Output 10 ml  Net 368 ml        Physical Exam: Vital Signs Blood pressure (!) 100/55, pulse 69, temperature 97.7 F (36.5 C), resp. rate 10, height 6' (1.829 m), weight 72.6 kg, SpO2 100%.  Constitutional: No distress . Vital signs reviewed. HEENT: NCAT, EOMI, oral membranes moist Neck: supple Cardiovascular: RRR without murmur. No JVD    Respiratory/Chest: CTA Bilaterally without wheezes or rales. Normal effort    GI/Abdomen: BS +, non-tender, non-distended, perc chole tube Ext: no clubbing, cyanosis, or edema Psych: pleasant and cooperative  Skin: Left aVF, left BK with s/s drainage, gauze, ACE wrap in place Neuro: Alert and oriented to person, place, reasons, month/year. More attentive. Better insight and awareness. Speech clearer. Cranial nerves II through XII grossly intact. Bilateral upper extremity strength 5 out of 5. Right lower extremity strength 4 out of  5 proximal, 5 out of 5 distal. Left lower extremity at least 3-5, stable 9/9 Sensation intact light touch in all extremities  Assessment/Plan: 1. Functional deficits which require 3+ hours per day of interdisciplinary therapy in a comprehensive inpatient rehab setting. Physiatrist is providing close team supervision and 24 hour management of active  medical problems listed below. Physiatrist and rehab team continue to assess barriers to discharge/monitor patient progress toward functional and medical goals  Care Tool:  Bathing    Body parts bathed by patient: Right arm, Left arm, Chest, Abdomen, Front perineal area, Buttocks, Right upper leg, Left upper leg, Face   Body parts bathed by helper: Right lower leg Body parts n/a: Left lower leg   Bathing assist Assist Level: Minimal Assistance - Patient > 75%     Upper Body Dressing/Undressing Upper body dressing   What is the patient wearing?: Pull over shirt    Upper body assist Assist Level: Set up assist    Lower Body Dressing/Undressing Lower body dressing      What is the patient wearing?: Underwear/pull up, Pants, Ace wrap/stump shrinker     Lower body assist Assist for lower body dressing: Moderate Assistance - Patient 50 - 74%     Toileting Toileting    Toileting assist Assist for toileting: Maximal Assistance - Patient 25 - 49%     Transfers Chair/bed transfer  Transfers assist     Chair/bed transfer assist level: Minimal Assistance - Patient > 75%     Locomotion Ambulation   Ambulation assist   Ambulation activity did not occur: Safety/medical concerns          Walk 10 feet activity   Assist  Walk 10 feet activity did not occur: Safety/medical concerns        Walk 50 feet activity   Assist Walk 50 feet with 2 turns activity did not occur: Safety/medical concerns         Walk 150 feet activity   Assist Walk 150 feet activity did not occur: Safety/medical concerns         Walk 10 feet on uneven surface  activity   Assist Walk 10 feet on uneven surfaces activity did not occur: Safety/medical concerns         Wheelchair     Assist Is the patient using a wheelchair?: Yes Type of Wheelchair: Manual    Wheelchair assist level: Supervision/Verbal cueing Max wheelchair distance: 150'    Wheelchair 50 feet  with 2 turns activity    Assist        Assist Level: Supervision/Verbal cueing   Wheelchair 150 feet activity     Assist      Assist Level: Supervision/Verbal cueing   Blood pressure (!) 100/55, pulse 69, temperature 97.7 F (36.5 C), resp. rate 10, height 6' (1.829 m), weight 72.6 kg, SpO2 100%.  Medical Problem List and Plan: 1. Functional deficits secondary to left BKA 01/30/2024 after failed TMA with history of PAD status post PCI to left tibia/fibula angioplasty              -patient may  shower             -ELOS/Goals: 7 days with mod I goals for mobility and self-care  Continue CIR therapies including PT, OT   Grounds pass ordered  2.  Anemia: Hgb reviewed and is downtrending, repeat CBC ordered for tomorrow -DVT/anticoagulation:  continue SQ heparin              -antiplatelet  therapy: Aspirin  81 mg daily, Plavix  75 mg daily  3. Pain Management: continue Oxycodone  as needed Continue gabapentin   4. Insomnia: continue prn melatonin  5. Neuropsych/cognition: This patient is capable of making decisions on his own behalf.  -9/7pt appears clearer today. See above  6. Skin/Wound Care: Routine skin checks  -local care to stump. Can use ACE for now for compression if he likes 7. Fluids/Electrolytes/Nutrition: Routine in and out with follow-up chemistries 8.  Aortic stenosis.  Status post TAVR 01/20/2024 9.  End-stage renal disease.  Continue hemodialysis as per renal services             -HD at end of day to allow participation in therapy  - HD T TH S 10.  Hyperlipidemia.  Lipitor 11.  Hypotension.  Coreg  3.125 mg twice daily, decrease imdur  to 60mg  daily.  Monitor with increased mobility    02/10/2024    1:30 PM 02/10/2024    1:15 PM 02/10/2024    1:11 PM  Vitals with BMI  Weight   160 lbs 1 oz  BMI   21.7  Systolic 100 103 890  Diastolic 55 59 68  Pulse 69 70 70   12.  Hypothyroidism.  Synthroid  13.  Acute on chronic anemia.  Continue Aranesp .  Follow-up  CBC  - 9/7 hemoglobin down to 8.8--follow with HD labs 14.  CAD with history of CABG.  No chest pain or shortness of breath.  Continue Imdur   15.  BPH: d/c flomax  given hypotension  16.  Chronic diastolic congestive heart failure.  Monitor for any signs of fluid overload.  Follow-up per cardiology services.             -daily weights             -volume mgt with HD, HD today Filed Weights   02/09/24 0500 02/10/24 0500 02/10/24 1311  Weight: 70 kg 71.4 kg 72.6 kg    17.  Recent acute cholecystitis June 2025.  Currently maintained on Augmentin  and linezolid  for 3 to 4 weeks and will confirm duration of antibiotic.  Radiology following  for percutaneous cholecystostomy tube for recent cholecystitis. Awaiting plan for continued suction drainage versus change to gravity bag. IR consulted since chole tube site appears irritated     LOS: 5 days A FACE TO FACE EVALUATION WAS PERFORMED  Jossette Zirbel P Nyxon Strupp 02/10/2024, 1:32 PM

## 2024-02-11 DIAGNOSIS — Z89512 Acquired absence of left leg below knee: Secondary | ICD-10-CM | POA: Diagnosis not present

## 2024-02-11 LAB — CBC WITH DIFFERENTIAL/PLATELET
Abs Immature Granulocytes: 0.02 K/uL (ref 0.00–0.07)
Basophils Absolute: 0 K/uL (ref 0.0–0.1)
Basophils Relative: 1 %
Eosinophils Absolute: 0.1 K/uL (ref 0.0–0.5)
Eosinophils Relative: 2 %
HCT: 26.8 % — ABNORMAL LOW (ref 39.0–52.0)
Hemoglobin: 8.3 g/dL — ABNORMAL LOW (ref 13.0–17.0)
Immature Granulocytes: 0 %
Lymphocytes Relative: 18 %
Lymphs Abs: 0.9 K/uL (ref 0.7–4.0)
MCH: 29.9 pg (ref 26.0–34.0)
MCHC: 31 g/dL (ref 30.0–36.0)
MCV: 96.4 fL (ref 80.0–100.0)
Monocytes Absolute: 0.5 K/uL (ref 0.1–1.0)
Monocytes Relative: 9 %
Neutro Abs: 3.4 K/uL (ref 1.7–7.7)
Neutrophils Relative %: 70 %
Platelets: 74 K/uL — ABNORMAL LOW (ref 150–400)
RBC: 2.78 MIL/uL — ABNORMAL LOW (ref 4.22–5.81)
RDW: 14.4 % (ref 11.5–15.5)
WBC: 4.9 K/uL (ref 4.0–10.5)
nRBC: 0 % (ref 0.0–0.2)

## 2024-02-11 MED ORDER — CHLORHEXIDINE GLUCONATE CLOTH 2 % EX PADS
6.0000 | MEDICATED_PAD | Freq: Every day | CUTANEOUS | Status: DC
  Administered 2024-02-11 – 2024-02-13 (×3): 6 via TOPICAL

## 2024-02-11 MED ORDER — HEPARIN SODIUM (PORCINE) 5000 UNIT/ML IJ SOLN
5000.0000 [IU] | Freq: Two times a day (BID) | INTRAMUSCULAR | Status: DC
  Administered 2024-02-11 – 2024-02-18 (×13): 5000 [IU] via SUBCUTANEOUS
  Filled 2024-02-11 (×12): qty 1

## 2024-02-11 MED ORDER — CARVEDILOL 3.125 MG PO TABS
3.1250 mg | ORAL_TABLET | Freq: Every day | ORAL | Status: DC
  Administered 2024-02-12 – 2024-02-22 (×11): 3.125 mg via ORAL
  Filled 2024-02-11 (×11): qty 1

## 2024-02-11 NOTE — Progress Notes (Signed)
 Occupational Therapy Session Note  Patient Details  Name: Phillip [name] MRN: [id number] Date of Birth: 26-Oct-1955  Today's Date: 02/11/2024 OT Individual Time: 1415-1500 OT Individual Time Calculation (min): 45 min    Short Term Goals: Week 1:  OT Short Term Goal 1 (Week 1): Pt will complete LB bathing with Mod A OT Short Term Goal 2 (Week 1): Pt will complete toilet transfer with Min A OT Short Term Goal 3 (Week 1): Pt will complete LB dressing with Mod A  Skilled Therapeutic Interventions/Progress Updates:   Skilled OT session completed to address functional transfers and [facility] management. Pt received sitting in [facility] with wife present, agreeable to participate in therapy. Pt reports pain in RLE with activity, OT provided pain intervention by providing rest breaks and repositioning.   PT provided [facility] cushion for pt in room. WC>EOB SB with CGA, Pt required VC for proper SB placement, able to perform transfer with increased time and rest breaks while scooting. OT swapped out ROHO cushion with standard cushion for comfort. EOB>WC SB with Min A d/t decreased endurance. Education provided on energy conservation and breathing when performing activity. Pt reports decreased pain in chair with ROHO cushion. OT prompts pt to manage WC components including leg rests, arm rests, and brakes. Pt can doff leg rests, manage breaks, and arm rests with independence, will benefit from continued practice to don leg rests.   WC><TTB lateral scoot transfer with Mod A. OT encourages to trial shower with TTB at next session, pt declines d/t increased anxiety performing transfer. During return transfer pt, displayed increased fear and began to shift weight posteriorly, OT provided max cues to lean forward to weight shift anteriorly to increase ease of transfer. Education provided on fall prevention and safety with assisted transfers, Pt should not grab or pull on transfer partner and reduce panic when performing  transfers to reduce risk for falls. pt and wife verbalized understanding.   Pt performed the following exs to increase strength in RLE for standing standing during functional transfers. 3x15 leg kicks 3x15 heel raises 3x15 toe raises  3x15 marches  3x15 hip abduction  Pt required continuous demo to complete task.    At end of session, Pt seated in [employer] with chair alarm on and wife present with all needs in reach.  Therapy Documentation Precautions:  Precautions Precautions: Fall Recall of Precautions/Restrictions: Intact Precaution/Restrictions Comments: R chole drain, L BKA Required Braces or Orthoses: Other Brace Other Brace: limb guard Restrictions Weight Bearing Restrictions Per Provider Order: No LLE Weight Bearing Per Provider Order: Non weight bearing   Therapy/Group: Individual Therapy  Daren Doswell Woods-Chance, MS, OTR/L 02/11/2024, 8:24 AM

## 2024-02-11 NOTE — Progress Notes (Signed)
 Supervising Physician: Johann Sieving  Patient Status:  [facility] - In-pt  Chief Complaint:  Acute cholecystitis s/p cholecystomy tube placement  Subjective:  Patient seen at bedside. Sitting up in recliner. Wife at bedside. States that they hope to be discharged on 9.19.25  Allergies: Patient has no known allergies.  Medications: Prior to Admission medications   Medication Sig Start Date End Date Taking? Authorizing Provider  acetaminophen  (TYLENOL ) 500 MG tablet Take 1,000 mg by mouth every 6 (six) hours as needed for moderate pain or headache.     [provider]  amoxicillin -clavulanate (AUGMENTIN ) 500-125 MG tablet Take 1 tablet by mouth 2 (two) times daily. 02/05/24   Sigdel, Santosh, MD  aspirin  EC 81 MG tablet Take 1 tablet (81 mg total) by mouth daily. Swallow whole. 07/25/23   Thukkani, Arun K, MD  atorvastatin  (LIPITOR) 20 MG tablet Take 1 tablet (20 mg total) by mouth daily. 07/25/23   Thukkani, Arun K, MD  bisacodyl  (DULCOLAX) 5 MG EC tablet Take 1 tablet (5 mg total) by mouth daily as needed for moderate constipation. 02/05/24   Sigdel, Santosh, MD  carvedilol  (COREG ) 3.125 MG tablet Take 1 tablet (3.125 mg total) by mouth 2 (two) times daily. 05/20/23 05/02/24  Carlin Delon BROCKS, NP  clopidogrel  (PLAVIX ) 75 MG tablet Take 1 tablet (75 mg total) by mouth daily. 12/26/23   Marea Selinda RAMAN, MD  gabapentin  (NEURONTIN ) 300 MG capsule Take 1 capsule (300 mg total) by mouth at bedtime. Patient taking differently: Take 600 mg by mouth 2 (two) times daily. 08/25/23   Arrien, Elidia Sieving, MD  insulin  glargine (LANTUS ) 100 UNIT/ML injection Inject 0.07 mLs (7 Units total) into the skin at bedtime. Inject 7 units at bedtime as needed after checking glucose 02/05/24   Sigdel, Santosh, MD  isosorbide  mononitrate (IMDUR ) 120 MG 24 hr tablet Take 1 tablet (120 mg total) by mouth daily. 08/27/23   Revankar, Mylez Venable SAUNDERS, MD  latanoprost  (XALATAN ) 0.005 % ophthalmic solution Place 1 drop  into both eyes at bedtime. 04/15/23   [provider]  levothyroxine  (SYNTHROID ) 50 MCG tablet Take 50 mcg by mouth daily. 04/05/23   [provider]  linezolid  (ZYVOX ) 600 MG tablet Take 1 tablet (600 mg total) by mouth every 12 (twelve) hours. 02/05/24   Sigdel, Santosh, MD  Methoxy PEG-Epoetin Beta (MIRCERA IJ) 30 mcg. 12/16/23 12/14/24  [provider]  nitroGLYCERIN  (NITROSTAT ) 0.4 MG SL tablet Place 1 tablet (0.4 mg total) under the tongue every 5 (five) minutes as needed for chest pain. 11/20/20   Revankar, Donneisha Beane SAUNDERS, MD  sevelamer  carbonate (RENVELA ) 800 MG tablet Take 800 mg by mouth 3 (three) times daily with meals. 09/04/23 11/29/24  [provider]  sodium chloride  flush (NS) 0.9 % SOLN Inject 5 mLs into the vein daily for 15 days. flush drain daily with 5 cc NS, record output daily, dressing changes every 2-3 days or earlier if soiled 11/14/23 11/29/23  Darci Pore, MD  sodium chloride  flush 0.9 % SOLN injection 10 mLs by Intracatheter route every 3 (three) days. 01/02/24 04/01/24  Covington, Jamie R, NP  tamsulosin  (FLOMAX ) 0.4 MG CAPS capsule Take 0.4 mg by mouth daily after breakfast. 04/19/20   [provider]  traMADol  (ULTRAM ) 50 MG tablet Take 50 mg by mouth every 8 (eight) hours as needed. 09/15/23   [provider]     Vital Signs: BP (!) 100/56 (BP Location: Right Arm)   Pulse 70  Temp 97.8 F (36.6 C) (Oral)   Resp 17   Ht 6' (1.829 m)   Wt 160 lb 7.9 oz (72.8 kg)   SpO2 100%   BMI 21.77 kg/m   Physical Exam  Imaging: IR DIALY SHUNT INTRO NEEDLE/INTRACATH INITIAL W/IMG LEFT Result Date: 02/09/2024 INDICATION: Difficult cannulation, infiltration EXAM: Left upper arm AV fistulagram MEDICATIONS: None. ANESTHESIA/SEDATION: Total intra-service moderate Sedation Time: None. The patient's level of consciousness and vital signs were monitored continuously by radiology nursing throughout the procedure under my direct  supervision. FLUOROSCOPY: Radiation Exposure Index (as provided by the fluoroscopic device): 13 mGy Kerma COMPLICATIONS: None immediate. PROCEDURE: Informed written consent was obtained from the patient after a thorough discussion of the procedural risks, benefits and alternatives. All questions were addressed. Maximal Sterile Barrier Technique was utilized including caps, mask, sterile gowns, sterile gloves, sterile drape, hand hygiene and skin antiseptic. A timeout was performed prior to the initiation of the procedure. Under sterile conditions, percutaneous Angiocath access performed of the left upper arm patent brachiocephalic fistula. Contrast injection performed for a complete fistulagram. Arterial anastomosis to the brachial artery at the elbow is widely patent. Outflow cephalic vein is patent in the upper arm and across the shoulder. Patent cephalic arch. Centrally, the subclavian, innominate vein, and SVC are all patent. No significant flow limiting stenosis, occlusion or thrombus. Access removed. Hemostasis obtained with manual compression. No immediate complication. Patient tolerated the procedure well. IMPRESSION: Patent left upper arm brachiocephalic AV fistula circuit. ACCESS: This access remains amenable to future percutaneous interventions as clinically indicated. Electronically Signed   By: CHRISTELLA.  Shick M.D.   On: 02/09/2024 09:19    Labs:  CBC: Recent Labs    02/05/24 1936 02/08/24 0500 02/09/24 0515 02/10/24 0444  WBC 9.0 5.9 5.2 5.3  HGB 9.4* 8.8* 8.7* 8.3*  HCT 31.6* 28.6* 28.1* 25.3*  PLT 168 113* 103* 83*    COAGS: Recent Labs    11/11/23 1930 11/12/23 1011 01/19/24 1844 02/09/24 0515  INR 1.4* 1.3* 1.1 1.1  APTT  --   --  39*  --     BMP: Recent Labs    02/05/24 1936 02/08/24 0500 02/09/24 0515 02/10/24 0444  NA 133* 131* 129* 129*  K 4.4 5.0 5.2* 5.2*  CL 89* 90* 91* 89*  CO2 28 27 25 23   GLUCOSE 155* 97 110* 91  BUN 25* 41* 69* 95*  CALCIUM  8.9 8.3*  8.2* 8.0*  CREATININE 5.44* 6.60* 8.10* 9.37*  GFRNONAA 11* 9* 7* 6*    LIVER FUNCTION TESTS: Recent Labs    11/13/23 0821 01/15/24 0338 01/19/24 1844 01/26/24 0328 02/03/24 1630 02/05/24 1936 02/08/24 0500 02/09/24 0515 02/10/24 0444  BILITOT 2.4* 0.8 0.8 0.8  --   --   --   --   --   AST 187* 19 22 17   --   --   --   --   --   ALT 97* 11 <5 <5  --   --   --   --   --   ALKPHOS 123 142* 161* 96  --   --   --   --   --   PROT 8.1 7.6 9.4* 8.4*  --   --   --   --   --   ALBUMIN  2.7* 2.1* 2.5* 2.4*   < > 2.4* 2.2* 2.2* 2.2*   < > = values in this interval not displayed.    Assessment and Plan:  67 y,o. Male  inpatient. History of acute cholecystitis s/p cholecystomy tube placement on 6.11.25. Lat exchanged on 9.2.25 due to purulence around the exit site.  Per note from [facility] Attending Dr. Ester Sides dated 9.2.2.5   PLAN: Keep to bulb suction until purulent output clears, then switch to bag drainage.  Patient seen at bedside output remains purulent in nature. Case discussed with IR Attending Dr. CHARM CHARM Faes who recommends that patient remain to a JP drain at this time. No new imaging needed at this time. SABRA Ribas Location: RUQ Size: Fr size: 10 Fr Date of placement: 6.11.25  Currently to: Drain collection device: suction bulb 24 hour output:  Output by Drain (mL) 02/09/24 0700 - 02/09/24 1459 02/09/24 1500 - 02/09/24 2259 02/09/24 2300 - 02/10/24 0659 02/10/24 0700 - 02/10/24 1459 02/10/24 1500 - 02/10/24 2259 02/10/24 2300 - 02/11/24 0659 02/11/24 0700 - 02/11/24 1459 02/11/24 1500 - 02/11/24 1705  Closed System Drain 1 Right;Lateral Abdomen Other (Comment) 10 Fr.  10          Interval imaging/drain manipulation:  None since drain exchange on 9.2.25   Current examination: Flushes/aspirates easily.  Insertion site unremarkable. Suture and stat lock in place. Dressed appropriately.   Plan: Continue TID flushes with 5 cc NS. Record output Q shift. Dressing  changes QD or PRN if soiled.  Call IR APP or on call IR MD if difficulty flushing or sudden change in drain output.   Discharge planning: Please contact IR APP or on call IR MD prior to patient d/c to ensure appropriate follow up plans are in place. Typically patient will follow up 6-8 week for drain exchange. Cholecystomy tube drain will need to remain in place at least 6 -8  weeks unless gallbaldder surgically removed in interim; Once daily with 5 ml sterile normal saline as outpatient; will schedule f/u cholangiogram/ drain/ exchange in 6-8 weeks. IR scheduler will contact patient with date/time of appointment. Patient will need to flush drain QD with 5 cc NS, record output QD, dressing changes every 2-3 days or earlier if soiled.   IR will continue to follow - please call with questions or concerns.    Electronically Signed: Delon JAYSON Beagle, NP 02/11/2024, 5:13 PM   I spent a total of 15 Minutes at the the patient's bedside AND on the patient's hospital floor or unit, greater than 50% of which was counseling/coordinating care for cholecystomy tube palcement

## 2024-02-11 NOTE — Plan of Care (Signed)
  Problem: Consults Goal: RH LIMB LOSS PATIENT EDUCATION Description: Description: See Patient Education module for eduction specifics. Outcome: Progressing   Problem: RH BOWEL ELIMINATION Goal: RH STG MANAGE BOWEL WITH ASSISTANCE Description: STG Manage Bowel with mod I Assistance. Outcome: Progressing   Problem: RH SKIN INTEGRITY Goal: RH STG SKIN FREE OF INFECTION/BREAKDOWN Outcome: Progressing   Problem: RH SAFETY Goal: RH STG ADHERE TO SAFETY PRECAUTIONS W/ASSISTANCE/DEVICE Description: STG Adhere to Safety Precautions With mod I Assistance/Device. Outcome: Progressing   Problem: RH PAIN MANAGEMENT Goal: RH STG PAIN MANAGED AT OR BELOW PT'S PAIN GOAL Description: <4 w/ prns Outcome: Progressing   Problem: RH KNOWLEDGE DEFICIT LIMB LOSS Goal: RH STG INCREASE KNOWLEDGE OF SELF CARE AFTER LIMB LOSS Description: Manage increase knowledge of self carte after limb loss with mod I assistance from wife using educational materials provided Outcome: Progressing

## 2024-02-11 NOTE — Progress Notes (Signed)
 Have reviewed team conference with pt and family. Both aware and agreeable with targeted d/c date of 9/19 and goals of Independent with assistive device.   Family education to be set up for 9/17 with wife, [name].

## 2024-02-11 NOTE — Plan of Care (Signed)
  Problem: RH Problem Solving Goal: LTG Patient will demonstrate problem solving for (SLP) Description: LTG:  Patient will demonstrate problem solving for basic/complex daily situations with cues  (SLP) Flowsheets (Taken 02/11/2024 1546) LTG: Patient will demonstrate problem solving for (SLP): Basic daily situations LTG Patient will demonstrate problem solving for: Minimal Assistance - Patient > 75%   Problem: RH Memory Goal: LTG Patient will use memory compensatory aids to (SLP) Description: LTG:  Patient will use memory compensatory aids to recall biographical/new, daily complex information with cues (SLP) Flowsheets (Taken 02/11/2024 1546) LTG: Patient will use memory compensatory aids to (SLP): Minimal Assistance - Patient > 75%

## 2024-02-11 NOTE — Evaluation (Incomplete)
 Speech Language Pathology Assessment and Plan  Patient Details  Name: Phillip [name] MRN: [id number] Date of Birth: 07/07/1955  SLP Diagnosis: Cognitive Impairments  Rehab Potential: Good ELOS:      {chl ip rehab slp time calculations:304100500}   Hospital Problem: Principal Problem:   Left below-knee amputee [facility]) Active Problems:   Protein-calorie malnutrition, severe  Past Medical History:  Past Medical History:  Diagnosis Date   Abdominopelvic abscess (HCC) 09/29/2018   Abscess of appendix 09/22/2018   Acquired spondylolisthesis of lumbosacral region 03/20/2020   Acute cholecystitis 11/15/2023   Acute kidney injury (HCC) 10/07/2018   Acute kidney injury superimposed on chronic kidney disease (HCC) 11/06/2020   Acute kidney injury superimposed on CKD (HCC) 02/18/2020   Acute on chronic systolic (congestive) heart failure (HCC) 07/26/2015   Formatting of this note might be different from the original. Formatting of this note might be different from the original. Formatting of this note might be different from the original. revenkar EF 35% Formatting of this note might be different from the original. revenkar EF 35%     Acute on chronic systolic CHF (congestive heart failure) (HCC) 08/12/2023   Anasarca 07/01/2023   Anemia of chronic disease 08/18/2019   Anemia, chronic disease 08/18/2019   Aortic atherosclerosis (HCC)    Aortic stenosis    a.) TTE 09/15/2018: mild-mod (MPG 19); b.) TTE 03/21/2021: mild-mod (MPG 14.3); c.) TTE 04/24/2022: mild- mod (MPG 15)   Aortic stenosis, moderate 10/15/2019   Appendicitis 09/08/2018   ARF (acute renal failure) (HCC) 02/19/2020   Asthma    Atherosclerosis of native arteries of the extremities with ulceration (HCC) 02/24/2018   Formatting of this note might be different from the original.  Last Assessment & Plan:   His ABIs today are 1.07 on the right and 0.99 on the left with biphasic waveforms.  Although these pressures may be  somewhat elevated from medial calcification, his flow does appear to be reasonably good.  His left ABI was 0.58 prior to intervention.  At this point, we will stretch out his follow-up and see hi   Benign hypertension with CKD (chronic kidney disease) stage IV (HCC) 02/24/2018   Benign prostatic hyperplasia without lower urinary tract symptoms 01/14/2018   Bilateral lower extremity edema 11/04/2018   Bradycardia 03/25/2018   Bruit of right carotid artery 07/26/2015   CAD (coronary artery disease) 2003   a.) s/p 4v CABG 2003   Cardiac murmur 07/26/2015   Choledocholithiasis 11/12/2023   Chronic combined systolic and diastolic CHF (congestive heart failure) (HCC) 07/26/2015   a.) TTE 09/15/2018: EF 50-55%, mod LVH, RVE, BAE, mild-mod TR, AoV sclerosis, G1DD; b.) MPI 10/27/2019: EF <30%; c.) TTE 03/21/2021: EF 35-40%, post AK, inf HK, mod LVH, mod red RVSF, mod LAE, mod Aov sclerosis, G2DD; c.) TTE 04/24/2022: EF 35-40%, post AK, glok HK, mod LVE, mod red RVSF, mild-mod MR, Aov sclerosis, G3DD   Chronic idiopathic constipation 11/02/2018   Chronic low back pain without sciatica 12/02/2018   Chronic pain of right hip 03/31/2020   Chronic systolic heart failure (HCC) 07/26/2015   Formatting of this note might be different from the original. revenkar EF 35%     CKD (chronic kidney disease) stage 4, GFR 15-29 ml/min (HCC) 10/07/2018   Congestive heart failure (HCC) 07/26/2015   Last Assessment & Plan:   Poor cardiac function could certainly be contributing to poor blood supply to the feet and toes as well.     Formatting of this note  might be different from the original.  Formatting of this note might be different from the original.  Last Assessment & Plan:   Poor cardiac function could certainly be contributing to poor blood supply to the feet and toes as well.     Last   Coronary artery disease    Diabetes mellitus without complication (HCC)    Dysphagia 07/26/2015   Encounter for long-term  (current) use of insulin  (HCC) 09/27/2020   Erectile dysfunction 07/14/2017   ESRD on dialysis [facility]) 11/15/2023   Essential hypertension 02/24/2018   Fever 11/07/2020   Foot ulcer (HCC) 09/27/2020   left foot  Formatting of this note might be different from the original.  Formatting of this note might be different from the original.  left foot     Foot ulcer, left (HCC)    GERD (gastroesophageal reflux disease)    Heart murmur 07/26/2015   Hematuria 07/27/2015   High risk medication use 07/26/2015   History of marijuana use    Hyperkalemia 05/29/2020   Hyperlipidemia 07/26/2015   Hyperlipidemia associated with type 2 diabetes mellitus (HCC) 02/18/2020   Hypertension    Hypertension associated with diabetes (HCC)    Hypoglycemia 05/07/2019   Hypothyroidism 11/14/2015   Hypothyroidism (acquired) 11/14/2015   Insomnia 11/02/2018   Kidney insufficiency    Lumbar spondylosis 03/20/2020   Malaise and fatigue 03/25/2018   Malnutrition of moderate degree 08/20/2023   Mitral regurgitation 05/12/2019   Moderate aortic regurgitation 05/12/2019   Myocardial infarction due to demand ischemia (HCC) 09/26/2020   a.) Type II NSTEMI; b.) troponins were trended 0.54 --> 0.56 --> 0.52 ng/mL   Non-compliance 05/05/2019   NSTEMI (non-ST elevated myocardial infarction) (HCC) 09/27/2020   Osseous and subluxation stenosis of intervertebral foramina of lumbar region 03/20/2020   Osteoarthritis 10/06/2019   Pancytopenia (HCC) 08/12/2023   Peripheral vascular disease (HCC)    Personal history of tobacco use, presenting hazards to health 09/27/2020   Polyneuropathy in diabetes (HCC) 05/07/2019   Primary osteoarthritis involving multiple joints 10/06/2019   Primary osteoarthritis of right hip 02/21/2020   Pulmonary HTN (HCC)    a.) TTE 05/12/2019: PASP 71; b.) TTE 09/27/2020: PASP >70; c.) TTE 03/21/2021: RVSP 43; d.) TTE 04/24/2022: RVSP 80.6   Puncture wound of right hip 04/02/2018   PVD (peripheral  vascular disease) with claudication (HCC)    a.) s/p PTA 03/02/2018 - balloon angioplasty LEFT below knee popliteal artery; b.) s/p PTA 09/30/2022: baloon angioplasty LEFT tibioperoneal trunck, most proximal peroneal artery, and LEFT popliteal artery.   Regional wall motion abnormality of heart 05/07/2019   Right groin pain 02/18/2020   S/P CABG x 4 2003   S/P TAVR (transcatheter aortic valve replacement) 01/20/2024   TAVR with a 29 mm Edwards Sapien 3 Ultra Resilia THV via the TF by Dr. Wendel and Dr. Shyrl   Screening for colon cancer 02/18/2018   Sepsis (HCC) 11/15/2023   Sleep apnea 07/26/2015   a.) does not require nocturnal PAP therapy   Subacute osteomyelitis of left foot (HCC) 03/25/2018   Thrombocytopenia (HCC)    Type 2 diabetes mellitus with hyperlipidemia (HCC) 07/26/2015   Type 2 diabetes mellitus with stage 4 chronic kidney disease, with long-term current use of insulin  (HCC) 07/26/2015   Type II diabetes mellitus with neurological manifestations (HCC) 02/18/2020   Vitamin B12 deficiency 06/16/2019   Vitamin D  deficiency 05/07/2018   Past Surgical History:  Past Surgical History:  Procedure Laterality Date   AMPUTATION Left 01/14/2024  Procedure: AMPUTATION, FOOT, PARTIAL;  Surgeon: Serene Gaile ORN, MD;  Location[facility] OR;  Service: Vascular;  Laterality: Left;   AMPUTATION Left 01/30/2024   Procedure: AMPUTATION BELOW KNEE;  Surgeon: Serene Gaile ORN, MD;  Location: [facility] OR;  Service: Vascular;  Laterality: Left;   AMPUTATION TOE Left 03/13/2018   Procedure: AMPUTATION TOE-MPJ;  Surgeon: Ashley Soulier, DPM;  Location: [facility] ORS;  Service: Podiatry;  Laterality: Left;   ANGIOPLASTY     AV FISTULA PLACEMENT Left 08/20/2023   Procedure: ARTERIOVENOUS (AV) FISTULA CREATION;  Surgeon: Gretta Lonni PARAS, MD;  Location: [facility] OR;  Service: Vascular;  Laterality: Left;   CARDIAC CATHETERIZATION     CORONARY ARTERY BYPASS GRAFT N/A 2003   INTRAOPERATIVE TRANSTHORACIC  ECHOCARDIOGRAM N/A 01/20/2024   Procedure: ECHOCARDIOGRAM, TRANSTHORACIC;  Surgeon: Wendel Lurena POUR, MD;  Location: [facility] INVASIVE CV LAB;  Service: Cardiovascular;  Laterality: N/A;   IR CATHETER TUBE CHANGE  12/31/2023   IR DIALY SHUNT INTRO NEEDLE/INTRACATH INITIAL W/IMG LEFT Left 02/09/2024   IR EXCHANGE BILIARY DRAIN  02/03/2024   IR FLUORO GUIDE CV LINE RIGHT  08/18/2023   IR PERC CHOLECYSTOSTOMY  11/12/2023   IR RADIOLOGIST EVAL & MGMT  12/31/2023   IR US  GUIDE VASC ACCESS RIGHT  08/18/2023   LAPAROSCOPIC APPENDECTOMY N/A 09/08/2018   Procedure: APPENDECTOMY LAPAROSCOPIC;  Surgeon: Jordis Laneta FALCON, MD;  Location: [facility] ORS;  Service: General;  Laterality: N/A;   LOWER EXTREMITY ANGIOGRAPHY Left 03/02/2018   Procedure: LOWER EXTREMITY ANGIOGRAPHY;  Surgeon: Marea Selinda RAMAN, MD;  Location: [facility] INVASIVE CV LAB;  Service: Cardiovascular;  Laterality: Left;   LOWER EXTREMITY ANGIOGRAPHY Left 09/30/2022   Procedure: Lower Extremity Angiography;  Surgeon: Marea Selinda RAMAN, MD;  Location: [facility] INVASIVE CV LAB;  Service: Cardiovascular;  Laterality: Left;   LOWER EXTREMITY ANGIOGRAPHY Left 12/26/2023   Procedure: Lower Extremity Angiography;  Surgeon: Marea Selinda RAMAN, MD;  Location: [facility] INVASIVE CV LAB;  Service: Cardiovascular;  Laterality: Left;   RIGHT/LEFT HEART CATH AND CORONARY/GRAFT ANGIOGRAPHY N/A 08/01/2023   Procedure: RIGHT/LEFT HEART CATH AND CORONARY/GRAFT ANGIOGRAPHY;  Surgeon: Wendel Lurena POUR, MD;  Location: [facility] INVASIVE CV LAB;  Service: Cardiovascular;  Laterality: N/A;   ROTATOR CUFF REPAIR Left    TRANSMETATARSAL AMPUTATION Left 01/16/2024   Procedure: AMPUTATION, FOOT, TRANSMETATARSAL;  Surgeon: Serene Gaile ORN, MD;  Location: [facility] OR;  Service: Vascular;  Laterality: Left;    Assessment / Plan / Recommendation Clinical Impression    Skilled Therapeutic Interventions          Patient presents with mild to moderate deficits in the areas of memory, verbal problem solving, generative naming, and  visuospatial skills. Patient scored 12/30 on SLUMS examination exhibiting aforementioned deficits. Orientation and auditory comprehension appears WNL. Patient reports changes in memory since admission to hospital which aligns with SLUMS results. However, patient's family not present at time of evaluation to attest to patient's cognitive baseline. An oral mechanism examination performed to assess facial and oral structures are working appropriately for speech and swallowing. Facial and oral structure appear symmetrical and WNL. Patient missing full dentition which impacts his overall speech intelligibility. Further, patient presents with impaired vocal quality c/b hoarseness and low vocal intensity, though patient notes this is baseline since childhood accident. Patient would benefit from speech therapy services to target aforementioned cognitive deficits.   SLP Assessment         Recommendations       SLP Frequency     SLP Duration  SLP Intensity  SLP Treatment/Interventions    Minumum of 1-2 x/day, 30 to 90 minutes  Multimodal communication approach;Cueing hierarchy;Therapeutic Activities;Internal/external aids;Therapeutic Exercise;Medication managment;Patient/family education    Pain Pain Assessment Pain Scale: 0-10 Pain Score: 7  Faces Pain Scale: Hurts little more Pain Location: Leg Pain Orientation: Left Pain Intervention(s): Medication (See eMAR) PAINAD (Pain Assessment in Advanced Dementia) Breathing: normal Negative Vocalization: none Facial Expression: smiling or inexpressive Body Language: relaxed Consolability: no need to console PAINAD Score: 0 Critical Care Pain Observation Tool (CPOT) Facial Expression: Relaxed, neutral Body Movements: Absence of movements  Prior Functioning Cognitive/Linguistic Baseline: Information not available Type of Home: House  Lives With: Spouse Available Help at Discharge: Family Vocation: Retired  SLP  Evaluation Cognition Overall Cognitive Status: No family/caregiver present to determine baseline cognitive functioning Arousal/Alertness: Awake/alert Orientation Level: Oriented X4 Year: 2025 Month: September Day of Week: Correct Attention: Sustained Sustained Attention: Appears intact Memory: Impaired Memory Impairment: Decreased short term memory;Decreased recall of new information;Retrieval deficit Decreased Short Term Memory: Functional complex;Verbal complex  Comprehension Auditory Comprehension Yes/No Questions: Within Functional Limits Conversation: Complex EffectiveTechniques: Repetition;Visual/Gestural cues Reading Comprehension Reading Status: Within funtional limits Expression Expression Primary Mode of Expression: Verbal Verbal Expression Overall Verbal Expression: Appears within functional limits for tasks assessed Initiation: No impairment Level of Generative/Spontaneous Verbalization: Conversation Pragmatics: No impairment Written Expression Dominant Hand: Right Written Expression: Not tested Oral Motor Oral Motor/Sensory Function Overall Oral Motor/Sensory Function: Within functional limits Motor Speech Overall Motor Speech: Appears within functional limits for tasks assessed Respiration: Within functional limits Phonation: Hoarse Resonance: Within functional limits Articulation: Within functional limitis Intelligibility: Intelligible Motor Planning: Within functional limits  Care Tool Care Tool Cognition Ability to hear (with hearing aid or hearing appliances if normally used Ability to hear (with hearing aid or hearing appliances if normally used): 0. Adequate - no difficulty in normal conservation, social interaction, listening to TV   Expression of Ideas and Wants Expression of Ideas and Wants: 4. Without difficulty (complex and basic) - expresses complex messages without difficulty and with speech that is clear and easy to understand    Understanding Verbal and Non-Verbal Content Understanding Verbal and Non-Verbal Content: 4. Understands (complex and basic) - clear comprehension without cues or repetitions  Memory/Recall Ability Memory/Recall Ability : Current season;That he or she is in a hospital/hospital unit   PMSV Assessment  PMSV Trial Intelligibility: Intelligible  Bedside Swallowing Assessment General    Oral Care Assessment Oral Assessment  (WDL): Exceptions to WDL Lips: Symmetrical Teeth: Missing (Comment) Tongue: Pink Mucous Membrane(s): Moist;Pink Saliva: Moist, saliva free flowing Level of Consciousness: Alert Is patient on any of following O2 devices?: None of the above Nutritional status: On thickened liquids Oral Assessment Risk : High Risk Ice Chips   Thin Liquid   Nectar Thick   Honey Thick   Puree   Solid   BSE Assessment    Short Term Goals: {DUH:6958315}  Refer to Care Plan for Long Term Goals  Recommendations for other services: {RECOMMENDATIONS FOR OTHER SERVICES:3049016}  Discharge Criteria: Patient will be discharged from SLP if patient refuses treatment 3 consecutive times without medical reason, if treatment goals not met, if there is a change in medical status, if patient makes no progress towards goals or if patient is discharged from hospital.  The above assessment, treatment plan, treatment alternatives and goals were discussed and mutually agreed upon: {Assessment/Treatment Plan Discussed/Agreed:3049017}  Tore Carreker Kwakye-Ndlovu 02/11/2024, 11:49 AM

## 2024-02-11 NOTE — Patient Care Conference (Signed)
 Inpatient RehabilitationTeam Conference and Plan of Care Update Date: 02/11/2024   Time: 11:42 AM    Patient Name: Phillip [name]      Medical Record Number: [id number]  Date of Birth: 03/11/1956 Sex: Male         Room/Bed: 4W16C/4W16C-01 Payor Info: Payor: [employer] / Plan: [employer] PPO / Product Type: *No Product type* /    Admit Date/Time:  02/05/2024  6:14 PM  Primary Diagnosis:  Left below-knee amputee [facility])  Hospital Problems: Principal Problem:   Left below-knee amputee [facility]) Active Problems:   Protein-calorie malnutrition, severe    Expected Discharge Date: Expected Discharge Date: 02/20/24  Team Members Present: Physician leading conference: Dr. Sven Elks Social Worker Present: Other (comment) Latanya Gentry, SW) Nurse Present: Barnie Ronde, RN PT Present: Therisa Stains, PT OT Present: Monica Peacock, OT SLP Present: Rosina Downy, SLP     Current Status/Progress Goal Weekly Team Focus  Bowel/Bladder   continent x2   to be continent/ at baseline   to assess contience    Swallow/Nutrition/ Hydration               ADL's   Set up UB, Mod A LB, Mod A toileting   Mod-I   barriers: global weakness, functional activity tolerance, anxiety with new activity    Mobility   bed mobility supervision/CGA, squat<>pivot transfers mod A, WC mobility 165ft supervision   mod I WC mobility and bed mobility, supervision transfers, min A car transfers, min A gait  barriers: limb loss education, weakness/deconditioning, decreased balance/coordination    Communication   moderate voice deficits and mild speech deficits - patient states this is baseline   supervision   Speech intelligibility strategies    Safety/Cognition/ Behavioral Observations  moderate deficits in memory, problem solving, attention, awareness   min assist   memory, problem solving, attention, awareness    Pain   pain in leg   no pain/decrease pain   decrease pain     Skin   skin intact/ jp drain site   skin will remain intact  assess skin for breakdown      Discharge Planning:  Patient plans to discharge home with wife, [name]. [name] will also provide transportation home. Awaiting therapy DME and follow up recommendations.   Team Discussion: Patient admitted post left BKA with recent cholecystitis/tube and history of ESRD/HD with poor recall, poor activity tolerance and weakness/deconditioning. Progress limied by moderate cognitive deficits, and fear of falling.  Patient on target to meet rehab goals: yes, currently needs set up for upper body care and mod assit for toileting and lower body care. NEeds mod assist for squat pivot transfers and max assist for sit to stand. Do no feel patient will be ambulating at discharge and is not able to hop on one leg.   *See Care Plan and progress notes for long and short-term goals.   Revisions to Treatment Plan:  Chole tube site assessment by MD Dietary Consult SLP eval and treat order   Teaching Needs: Safety, medications, skin care, tube management, residual limb care/shrinker care, transfers, toileting, etc.  Current Barriers to Discharge: Decreased caregiver support, Home enviroment access/layout, and Weight bearing restrictions with hemodialysis  Possible Resolutions to Barriers: Family education HH follow up services DME: TTB, BSC, Wheelchair     Medical Summary Current Status: irritated chole tube site, hypotension, hypothyroidism, CAD, pain, anemia  Barriers to Discharge: Medical stability  Barriers to Discharge Comments: irritated chole tube site, hypotension, hypothyroidism, CAD, pain,  anemia Possible Resolutions to [employer] Focus: IR consulted, imdur  decreased to 60mg , coreg  decresaed to HS, will consult cardiology, continue synthroid , continue aspirin /statin, continue gabapentin , repeat CBC and decreased heparin    Continued Need for Acute Rehabilitation Level of Care: The patient  requires daily medical management by a physician with specialized training in physical medicine and rehabilitation for the following reasons: Direction of a multidisciplinary physical rehabilitation program to maximize functional independence : Yes Medical management of patient stability for increased activity during participation in an intensive rehabilitation regime.: Yes Analysis of laboratory values and/or radiology reports with any subsequent need for medication adjustment and/or medical intervention. : Yes   I attest that I was present, lead the team conference, and concur with the assessment and plan of the team.   Fredericka Sober B 02/11/2024, 3:20 PM

## 2024-02-11 NOTE — Evaluation (Signed)
 Speech Language Pathology Assessment and Plan  Patient Details  Name: Phillip [name] MRN: [id number] Date of Birth: 18-Nov-1955  SLP Diagnosis: Cognitive Impairments  Rehab Potential: Good ELOS: 9/19    Today's Date: 02/11/2024 SLP Individual Time: 9185-9092 SLP Individual Time Calculation (min): 53 min   Hospital Problem: Principal Problem:   Left below-knee amputee (HCC) Active Problems:   Protein-calorie malnutrition, severe  Past Medical History:  Past Medical History:  Diagnosis Date   Abdominopelvic abscess (HCC) 09/29/2018   Abscess of appendix 09/22/2018   Acquired spondylolisthesis of lumbosacral region 03/20/2020   Acute cholecystitis 11/15/2023   Acute kidney injury (HCC) 10/07/2018   Acute kidney injury superimposed on chronic kidney disease (HCC) 11/06/2020   Acute kidney injury superimposed on CKD (HCC) 02/18/2020   Acute on chronic systolic (congestive) heart failure (HCC) 07/26/2015   Formatting of this note might be different from the original. Formatting of this note might be different from the original. Formatting of this note might be different from the original. revenkar EF 35% Formatting of this note might be different from the original. revenkar EF 35%     Acute on chronic systolic CHF (congestive heart failure) (HCC) 08/12/2023   Anasarca 07/01/2023   Anemia of chronic disease 08/18/2019   Anemia, chronic disease 08/18/2019   Aortic atherosclerosis (HCC)    Aortic stenosis    a.) TTE 09/15/2018: mild-mod (MPG 19); b.) TTE 03/21/2021: mild-mod (MPG 14.3); c.) TTE 04/24/2022: mild- mod (MPG 15)   Aortic stenosis, moderate 10/15/2019   Appendicitis 09/08/2018   ARF (acute renal failure) (HCC) 02/19/2020   Asthma    Atherosclerosis of native arteries of the extremities with ulceration (HCC) 02/24/2018   Formatting of this note might be different from the original.  Last Assessment & Plan:   His ABIs today are 1.07 on the right and 0.99 on the left with  biphasic waveforms.  Although these pressures may be somewhat elevated from medial calcification, his flow does appear to be reasonably good.  His left ABI was 0.58 prior to intervention.  At this point, we will stretch out his follow-up and see hi   Benign hypertension with CKD (chronic kidney disease) stage IV (HCC) 02/24/2018   Benign prostatic hyperplasia without lower urinary tract symptoms 01/14/2018   Bilateral lower extremity edema 11/04/2018   Bradycardia 03/25/2018   Bruit of right carotid artery 07/26/2015   CAD (coronary artery disease) 2003   a.) s/p 4v CABG 2003   Cardiac murmur 07/26/2015   Choledocholithiasis 11/12/2023   Chronic combined systolic and diastolic CHF (congestive heart failure) (HCC) 07/26/2015   a.) TTE 09/15/2018: EF 50-55%, mod LVH, RVE, BAE, mild-mod TR, AoV sclerosis, G1DD; b.) MPI 10/27/2019: EF <30%; c.) TTE 03/21/2021: EF 35-40%, post AK, inf HK, mod LVH, mod red RVSF, mod LAE, mod Aov sclerosis, G2DD; c.) TTE 04/24/2022: EF 35-40%, post AK, glok HK, mod LVE, mod red RVSF, mild-mod MR, Aov sclerosis, G3DD   Chronic idiopathic constipation 11/02/2018   Chronic low back pain without sciatica 12/02/2018   Chronic pain of right hip 03/31/2020   Chronic systolic heart failure (HCC) 07/26/2015   Formatting of this note might be different from the original. revenkar EF 35%     CKD (chronic kidney disease) stage 4, GFR 15-29 ml/min (HCC) 10/07/2018   Congestive heart failure (HCC) 07/26/2015   Last Assessment & Plan:   Poor cardiac function could certainly be contributing to poor blood supply to the feet and toes as well.  Formatting of this note might be different from the original.  Formatting of this note might be different from the original.  Last Assessment & Plan:   Poor cardiac function could certainly be contributing to poor blood supply to the feet and toes as well.     Last   Coronary artery disease    Diabetes mellitus without complication (HCC)     Dysphagia 07/26/2015   Encounter for long-term (current) use of insulin  (HCC) 09/27/2020   Erectile dysfunction 07/14/2017   ESRD on dialysis [facility]) 11/15/2023   Essential hypertension 02/24/2018   Fever 11/07/2020   Foot ulcer (HCC) 09/27/2020   left foot  Formatting of this note might be different from the original.  Formatting of this note might be different from the original.  left foot     Foot ulcer, left (HCC)    GERD (gastroesophageal reflux disease)    Heart murmur 07/26/2015   Hematuria 07/27/2015   High risk medication use 07/26/2015   History of marijuana use    Hyperkalemia 05/29/2020   Hyperlipidemia 07/26/2015   Hyperlipidemia associated with type 2 diabetes mellitus (HCC) 02/18/2020   Hypertension    Hypertension associated with diabetes (HCC)    Hypoglycemia 05/07/2019   Hypothyroidism 11/14/2015   Hypothyroidism (acquired) 11/14/2015   Insomnia 11/02/2018   Kidney insufficiency    Lumbar spondylosis 03/20/2020   Malaise and fatigue 03/25/2018   Malnutrition of moderate degree 08/20/2023   Mitral regurgitation 05/12/2019   Moderate aortic regurgitation 05/12/2019   Myocardial infarction due to demand ischemia (HCC) 09/26/2020   a.) Type II NSTEMI; b.) troponins were trended 0.54 --> 0.56 --> 0.52 ng/mL   Non-compliance 05/05/2019   NSTEMI (non-ST elevated myocardial infarction) (HCC) 09/27/2020   Osseous and subluxation stenosis of intervertebral foramina of lumbar region 03/20/2020   Osteoarthritis 10/06/2019   Pancytopenia (HCC) 08/12/2023   Peripheral vascular disease (HCC)    Personal history of tobacco use, presenting hazards to health 09/27/2020   Polyneuropathy in diabetes (HCC) 05/07/2019   Primary osteoarthritis involving multiple joints 10/06/2019   Primary osteoarthritis of right hip 02/21/2020   Pulmonary HTN (HCC)    a.) TTE 05/12/2019: PASP 71; b.) TTE 09/27/2020: PASP >70; c.) TTE 03/21/2021: RVSP 43; d.) TTE 04/24/2022: RVSP 80.6   Puncture  wound of right hip 04/02/2018   PVD (peripheral vascular disease) with claudication (HCC)    a.) s/p PTA 03/02/2018 - balloon angioplasty LEFT below knee popliteal artery; b.) s/p PTA 09/30/2022: baloon angioplasty LEFT tibioperoneal trunck, most proximal peroneal artery, and LEFT popliteal artery.   Regional wall motion abnormality of heart 05/07/2019   Right groin pain 02/18/2020   S/P CABG x 4 2003   S/P TAVR (transcatheter aortic valve replacement) 01/20/2024   TAVR with a 29 mm Edwards Sapien 3 Ultra Resilia THV via the TF by Dr. Wendel and Dr. Shyrl   Screening for colon cancer 02/18/2018   Sepsis (HCC) 11/15/2023   Sleep apnea 07/26/2015   a.) does not require nocturnal PAP therapy   Subacute osteomyelitis of left foot (HCC) 03/25/2018   Thrombocytopenia (HCC)    Type 2 diabetes mellitus with hyperlipidemia (HCC) 07/26/2015   Type 2 diabetes mellitus with stage 4 chronic kidney disease, with long-term current use of insulin  (HCC) 07/26/2015   Type II diabetes mellitus with neurological manifestations (HCC) 02/18/2020   Vitamin B12 deficiency 06/16/2019   Vitamin D  deficiency 05/07/2018   Past Surgical History:  Past Surgical History:  Procedure Laterality Date  AMPUTATION Left 01/14/2024   Procedure: AMPUTATION, FOOT, PARTIAL;  Surgeon: Serene Gaile ORN, MD;  Location: [facility] OR;  Service: Vascular;  Laterality: Left;   AMPUTATION Left 01/30/2024   Procedure: AMPUTATION BELOW KNEE;  Surgeon: Serene Gaile ORN, MD;  Location: [facility] OR;  Service: Vascular;  Laterality: Left;   AMPUTATION TOE Left 03/13/2018   Procedure: AMPUTATION TOE-MPJ;  Surgeon: Kamren Heintzelman Soulier, DPM;  Location: [facility] ORS;  Service: Podiatry;  Laterality: Left;   ANGIOPLASTY     AV FISTULA PLACEMENT Left 08/20/2023   Procedure: ARTERIOVENOUS (AV) FISTULA CREATION;  Surgeon: Gretta Lonni PARAS, MD;  Location[facility] OR;  Service: Vascular;  Laterality: Left;   CARDIAC CATHETERIZATION     CORONARY ARTERY BYPASS GRAFT  N/A 2003   INTRAOPERATIVE TRANSTHORACIC ECHOCARDIOGRAM N/A 01/20/2024   Procedure: ECHOCARDIOGRAM, TRANSTHORACIC;  Surgeon: Wendel Lurena POUR, MD;  Location: [facility] INVASIVE CV LAB;  Service: Cardiovascular;  Laterality: N/A;   IR CATHETER TUBE CHANGE  12/31/2023   IR DIALY SHUNT INTRO NEEDLE/INTRACATH INITIAL W/IMG LEFT Left 02/09/2024   IR EXCHANGE BILIARY DRAIN  02/03/2024   IR FLUORO GUIDE CV LINE RIGHT  08/18/2023   IR PERC CHOLECYSTOSTOMY  11/12/2023   IR RADIOLOGIST EVAL & MGMT  12/31/2023   IR US  GUIDE VASC ACCESS RIGHT  08/18/2023   LAPAROSCOPIC APPENDECTOMY N/A 09/08/2018   Procedure: APPENDECTOMY LAPAROSCOPIC;  Surgeon: Jordis Laneta FALCON, MD;  Location: [facility] ORS;  Service: General;  Laterality: N/A;   LOWER EXTREMITY ANGIOGRAPHY Left 03/02/2018   Procedure: LOWER EXTREMITY ANGIOGRAPHY;  Surgeon: Marea Selinda RAMAN, MD;  Location: [facility] INVASIVE CV LAB;  Service: Cardiovascular;  Laterality: Left;   LOWER EXTREMITY ANGIOGRAPHY Left 09/30/2022   Procedure: Lower Extremity Angiography;  Surgeon: Marea Selinda RAMAN, MD;  Location: [facility] INVASIVE CV LAB;  Service: Cardiovascular;  Laterality: Left;   LOWER EXTREMITY ANGIOGRAPHY Left 12/26/2023   Procedure: Lower Extremity Angiography;  Surgeon: Marea Selinda RAMAN, MD;  Location: [facility] INVASIVE CV LAB;  Service: Cardiovascular;  Laterality: Left;   RIGHT/LEFT HEART CATH AND CORONARY/GRAFT ANGIOGRAPHY N/A 08/01/2023   Procedure: RIGHT/LEFT HEART CATH AND CORONARY/GRAFT ANGIOGRAPHY;  Surgeon: Wendel Lurena POUR, MD;  Location: [facility] INVASIVE CV LAB;  Service: Cardiovascular;  Laterality: N/A;   ROTATOR CUFF REPAIR Left    TRANSMETATARSAL AMPUTATION Left 01/16/2024   Procedure: AMPUTATION, FOOT, TRANSMETATARSAL;  Surgeon: Serene Gaile ORN, MD;  Location: [facility] OR;  Service: Vascular;  Laterality: Left;    Assessment / Plan / Recommendation Clinical Impression   A 68 y.o. male with past medical history of CHF, anemia, ESRD on HD on TTHSat, PVD, diabetes mellitus type 2, severe aortic  stenosis, obesity, CAD s/p CABG, cholelithiasis. Patient was admitted to [facility] on 01/14/24 for gangrenous left third toe.  On 8/13 he had left third toe amputation including metatarsal head by Dr. Serene.  [facility] course nephrology consulted with dialysis ongoing as directed, TTS schedule.  Due to nonhealing left toe wound underwent left transmetatarsal amputation 01/16/2024.  Follow-up cardiology services for severe aortic stenosis.  Had a planned TAVR procedure which had been delayed due to left toe gangrene but was cleared for surgery and underwent TAVR 01/20/2024 per Dr. Shyrl. Close monitoring of recent left TMA with poor healing and limb was not felt to be salvageable, so he underwent L BKA 01/30/2024 per Dr. Serene and limb guard applied.  Acute on chronic anemia, latest hemoglobin 7.9.  Patient remains on aspirin  and Plavix  as prior to admission. Therapy evaluations completed and pt was recommended  for a comprehensive rehab program.   Cognitive-Linguistic Evaluation: Patient presents with mild to moderate deficits in the areas of memory, verbal problem solving, generative naming, and visuospatial skills. Patient scored 12/30 on SLUMS examination exhibiting aforementioned deficits. Orientation and auditory comprehension appears WNL. Patient reports changes in memory since admission to hospital which aligns with SLUMS results. However, patient's family not present at time of evaluation to attest to patient's cognitive baseline. An oral mechanism examination performed to assess facial and oral structures are working appropriately for speech and swallowing. Facial and oral structure appear symmetrical and WNL. Patient missing full dentition which impacts his overall speech intelligibility. Further, patient presents with impaired vocal quality c/b hoarseness and low vocal intensity, though patient notes this is baseline since childhood accident. Patient would benefit from speech therapy  services to target aforementioned cognitive deficits.     Skilled Therapeutic Interventions          SLP conducted skilled evaluation session to assess cognitive-linguistic function. Utilized SLUMS, functional in-room tasks, and patient and/or family interview. Full results above.   SLP Assessment  Patient will need skilled Speech Lanaguage Pathology Services during CIR admission    Recommendations  Oral Care Recommendations: Oral care BID Recommendations for Other Services: Neuropsych consult Patient destination: Home Follow up Recommendations: Outpatient SLP;24 hour supervision/assistance Equipment Recommended: None recommended by SLP    SLP Frequency 3 to 5 out of 7 days   SLP Duration  SLP Intensity  SLP Treatment/Interventions 9/19  Minumum of 1-2 x/day, 30 to 90 minutes  Multimodal communication approach;Cueing hierarchy;Therapeutic Activities;Internal/external aids;Therapeutic Exercise;Medication managment;Patient/family education;Environmental controls    Pain Pain Assessment Pain Scale: 0-10 Pain Score: 7  Faces Pain Scale: Hurts little more Pain Location: Leg Pain Orientation: Left Pain Intervention(s): Medication (See eMAR) PAINAD (Pain Assessment in Advanced Dementia) Breathing: normal Negative Vocalization: none Facial Expression: smiling or inexpressive Body Language: relaxed Consolability: no need to console PAINAD Score: 0 Critical Care Pain Observation Tool (CPOT) Facial Expression: Relaxed, neutral Body Movements: Absence of movements  Prior Functioning Cognitive/Linguistic Baseline: Information not available Type of Home: House  Lives With: Spouse Available Help at Discharge: Family Vocation: Retired  SLP Evaluation Cognition Overall Cognitive Status: No family/caregiver present to determine baseline cognitive functioning Arousal/Alertness: Awake/alert Orientation Level: Oriented X4 Year: 2025 Month: September Day of Week:  Correct Attention: Sustained Sustained Attention: Appears intact Memory: Impaired Memory Impairment: Decreased short term memory;Decreased recall of new information;Retrieval deficit Decreased Short Term Memory: Functional complex;Verbal complex Awareness: Impaired Awareness Impairment: Anticipatory impairment;Emergent impairment Problem Solving: Impaired Problem Solving Impairment: Verbal basic;Functional basic Safety/Judgment: Appears intact  Comprehension Auditory Comprehension Overall Auditory Comprehension: Appears within functional limits for tasks assessed Yes/No Questions: Within Functional Limits Commands: Within Functional Limits Conversation: Complex EffectiveTechniques: Repetition;Visual/Gestural cues Reading Comprehension Reading Status: Within funtional limits Expression Expression Primary Mode of Expression: Verbal Verbal Expression Overall Verbal Expression: Appears within functional limits for tasks assessed Initiation: No impairment Level of Generative/Spontaneous Verbalization: Conversation Naming: Impairment Other Naming Comments: Generative naming was impaired but suspect attention and behavior were impacting overall performance moreso than true language ability. Pragmatics: No impairment Interfering Components: Attention Effective Techniques: Open ended questions Non-Verbal Means of Communication: Not applicable Written Expression Dominant Hand: Right Written Expression: Not tested Oral Motor Oral Motor/Sensory Function Overall Oral Motor/Sensory Function: Within functional limits Motor Speech Overall Motor Speech: Appears within functional limits for tasks assessed Respiration: Within functional limits Phonation: Hoarse Resonance: Within functional limits Articulation: Within functional limitis Intelligibility: Intelligibility reduced Word: 75-100% accurate Phrase:  75-100% accurate Sentence: 75-100% accurate Conversation: 75-100% accurate Motor  Planning: Within functional limits Interfering Components: Inadequate dentition;Premorbid status  Care Tool Care Tool Cognition Ability to hear (with hearing aid or hearing appliances if normally used Ability to hear (with hearing aid or hearing appliances if normally used): 0. Adequate - no difficulty in normal conservation, social interaction, listening to TV   Expression of Ideas and Wants Expression of Ideas and Wants: 4. Without difficulty (complex and basic) - expresses complex messages without difficulty and with speech that is clear and easy to understand   Understanding Verbal and Non-Verbal Content Understanding Verbal and Non-Verbal Content: 4. Understands (complex and basic) - clear comprehension without cues or repetitions  Memory/Recall Ability Memory/Recall Ability : Current season;That he or she is in a hospital/hospital unit   Intelligibility: Intelligibility reduced Word: 75-100% accurate Phrase: 75-100% accurate Sentence: 75-100% accurate Conversation: 75-100% accurate  Short Term Goals: Week 1: SLP Short Term Goal 1 (Week 1): STGs=LTGs d/t ELOS  Refer to Care Plan for Long Term Goals  Recommendations for other services: Neuropsych  Discharge Criteria: Patient will be discharged from SLP if patient refuses treatment 3 consecutive times without medical reason, if treatment goals not met, if there is a change in medical status, if patient makes no progress towards goals or if patient is discharged from hospital.  The above assessment, treatment plan, treatment alternatives and goals were discussed and mutually agreed upon: by patient  Marissia Blackham, M.A., CCC-SLP  Lamesha Tibbits A Valbona Slabach 02/11/2024, 1:57 PM

## 2024-02-11 NOTE — Progress Notes (Signed)
 [facility] Progress Note   Subjective:    Patient is currently with PT in rehab. Tolerated yesterday's HD with minimal UF ( ). Bps are soft but he is asymptomatic. Next HD 9/11.  Objective Vitals:   02/10/24 1652 02/10/24 1655 02/10/24 1946 02/11/24 0458  BP: 111/62 116/63 125/71 (!) 104/58  Pulse: 82 78 92 75  Resp: 14 15 16 16   Temp:  97.7 F (36.5 C) 98.1 F (36.7 C) 97.9 F (36.6 C)  TempSrc:    Oral  SpO2: 98% 100% 99% 100%  Weight:  72.6 kg  72.8 kg  Height:       Physical Exam General: Well appearing man, NAD Heart: RRR Lungs: CTAB Abdomen: soft, R per chole tube in place Extremities: L BKA, no LE edema Dialysis Access: LUE AVF +t/b  Filed Weights   02/10/24 1311 02/10/24 1655 02/11/24 0458  Weight: 72.6 kg 72.6 kg 72.8 kg    Intake/Output Summary (Last 24 hours) at 02/11/2024 1054 Last data filed at 02/10/2024 1839 Gross per 24 hour  Intake 480 ml  Output 500 ml  Net -20 ml    Additional Objective Labs: Basic Metabolic Panel: Recent Labs  Lab 02/08/24 0500 02/09/24 0515 02/10/24 0444  NA 131* 129* 129*  K 5.0 5.2* 5.2*  CL 90* 91* 89*  CO2 27 25 23   GLUCOSE 97 110* 91  BUN 41* 69* 95*  CREATININE 6.60* 8.10* 9.37*  CALCIUM  8.3* 8.2* 8.0*  PHOS 4.6 5.2* 5.7*   Liver Function Tests: Recent Labs  Lab 02/08/24 0500 02/09/24 0515 02/10/24 0444  ALBUMIN  2.2* 2.2* 2.2*   No results for input(s): LIPASE, AMYLASE in the last 168 hours. CBC: Recent Labs  Lab 02/05/24 0650 02/05/24 1936 02/08/24 0500 02/09/24 0515 02/10/24 0444  WBC 9.0 9.0 5.9 5.2 5.3  NEUTROABS 6.9  --  4.1 3.7 3.6  HGB 9.7* 9.4* 8.8* 8.7* 8.3*  HCT 31.3* 31.6* 28.6* 28.1* 25.3*  MCV 96.3 96.6 96.6 96.9 95.5  PLT 156 168 113* 103* 83*   Blood Culture    Component Value Date/Time   SDES BILE 11/12/2023 1247   SPECREQUEST NONE 11/12/2023 1247   CULT MODERATE STREPTOCOCCUS SALIVARIUS 11/12/2023 1247   REPTSTATUS 11/14/2023 FINAL 11/12/2023  1247    Cardiac Enzymes: No results for input(s): CKTOTAL, CKMB, CKMBINDEX, TROPONINI in the last 168 hours. CBG: Recent Labs  Lab 02/04/24 2110 02/05/24 0559 02/05/24 1126 02/05/24 1706  GLUCAP 121* 108* 134* 124*   Iron  Studies: No results for input(s): IRON , TIBC, TRANSFERRIN, FERRITIN in the last 72 hours. Lab Results  Component Value Date   INR 1.1 02/09/2024   INR 1.1 01/19/2024   INR 1.3 (H) 11/12/2023   Studies/Results: No results found.  Medications:   (feeding supplement) PROSource Plus  30 mL Oral BID BM   amoxicillin -clavulanate  1 tablet Oral QHS   aspirin  EC  81 mg Oral Daily   atorvastatin   20 mg Oral Daily   [START ON 02/12/2024] carvedilol   3.125 mg Oral QHS   clopidogrel   75 mg Oral Daily   feeding supplement (NEPRO CARB STEADY)  237 mL Oral TID BM   gabapentin   200 mg Oral QHS   heparin  injection (subcutaneous)  5,000 Units Subcutaneous Q12H   isosorbide  mononitrate  60 mg Oral Daily   latanoprost   1 drop Both Eyes QHS   levothyroxine   50 mcg Oral Daily   linezolid   600 mg Oral Q12H   multivitamin  1 tablet Oral QHS  nutrition supplement (JUVEN)  1 packet Oral BID BM   sevelamer  carbonate  800 mg Oral TID WC    Dialysis Orders: TTS [facility] 4hr, 350/800, EDW 81kg, 2K/2.5Ca, L AVF, no heparin  - no VDRA, ESA ordered but not yet given  Assessment/Plan: 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 Per [facility]. Severe aortic stenosis s/p TAVR 01/20/24: Zio monitor in place. ESRD: HD TTS. Next HD 9/11. Vascular Access: S/p AVF infiltration and difficulty cannulating at his outpatient HD center. F'gram completed yesterday - normal/patent AVF. HTN/volume: BP soft, on low dose BB and isosorbide . Well below EDW, lower on discharge. Anemia of ESRD: Hgb 8.7, s/p Aranesp  on 9/3 - monitor. Secondary HPTH: Ca/Phos ok - continue sevelamer  as binder. Nutrition: Alb low, continue supps. Hx cholecystitis  with perc chole drain still in place: Erythema/purulence around drain, on PO abx. S/p CT without abscess, and now s/p tube exchange. T2DM Dispo: In CIR.  Charmaine Piety, NP [facility] 02/11/2024,10:54 AM  LOS: 6 days

## 2024-02-11 NOTE — Progress Notes (Signed)
 PROGRESS NOTE   Subjective/Complaints: No new complaints this morning Continues to have some yellow drainage from chole site, IR to be evaluating patient today  ROS: Patient denies fever, rash, sore throat, blurred vision, dizziness, nausea, vomiting, diarrhea, cough, shortness of breath or chest pain, back/neck pain, headache, or mood change.   Objective:   No results found.   Recent Labs    02/09/24 0515 02/10/24 0444  WBC 5.2 5.3  HGB 8.7* 8.3*  HCT 28.1* 25.3*  PLT 103* 83*   Recent Labs    02/09/24 0515 02/10/24 0444  NA 129* 129*  K 5.2* 5.2*  CL 91* 89*  CO2 25 23  GLUCOSE 110* 91  BUN 69* 95*  CREATININE 8.10* 9.37*  CALCIUM  8.2* 8.0*    Intake/Output Summary (Last 24 hours) at 02/11/2024 1016 Last data filed at 02/10/2024 1839 Gross per 24 hour  Intake 598 ml  Output 500 ml  Net 98 ml        Physical Exam: Vital Signs Blood pressure (!) 104/58, pulse 75, temperature 97.9 F (36.6 C), temperature source Oral, resp. rate 16, height 6' (1.829 m), weight 72.8 kg, SpO2 100%.  Constitutional: No distress . Vital signs reviewed. HEENT: NCAT, EOMI, oral membranes moist Neck: supple Cardiovascular: RRR without murmur. No JVD    Respiratory/Chest: CTA Bilaterally without wheezes or rales. Normal effort    GI/Abdomen: BS +, non-tender, non-distended, perc chole tube Ext: no clubbing, cyanosis, or edema Psych: pleasant and cooperative  Skin: Left aVF, left BK with s/s drainage, gauze, ACE wrap in place Neuro: Alert and oriented to person, place, reasons, month/year. More attentive. Better insight and awareness. Speech clearer. Cranial nerves II through XII grossly intact. Bilateral upper extremity strength 5 out of 5. Right lower extremity strength 4 out of 5 proximal, 5 out of 5 distal. Left lower extremity at least 3-5, stable 9/9 Sensation intact light touch in all  extremities  Assessment/Plan: 1. Functional deficits which require 3+ hours per day of interdisciplinary therapy in a comprehensive inpatient rehab setting. Physiatrist is providing close team supervision and 24 hour management of active medical problems listed below. Physiatrist and rehab team continue to assess barriers to discharge/monitor patient progress toward functional and medical goals  Care Tool:  Bathing    Body parts bathed by patient: Right arm, Left arm, Chest, Abdomen, Front perineal area, Buttocks, Right upper leg, Left upper leg, Face   Body parts bathed by helper: Right lower leg Body parts n/a: Left lower leg   Bathing assist Assist Level: Minimal Assistance - Patient > 75%     Upper Body Dressing/Undressing Upper body dressing   What is the patient wearing?: Pull over shirt    Upper body assist Assist Level: Set up assist    Lower Body Dressing/Undressing Lower body dressing      What is the patient wearing?: Underwear/pull up, Pants, Ace wrap/stump shrinker     Lower body assist Assist for lower body dressing: Moderate Assistance - Patient 50 - 74%     Toileting Toileting    Toileting assist Assist for toileting: Maximal Assistance - Patient 25 - 49%     Transfers Chair/bed transfer  Transfers assist     Chair/bed transfer assist level: Minimal Assistance - Patient > 75%     Locomotion Ambulation   Ambulation assist   Ambulation activity did not occur: Safety/medical concerns          Walk 10 feet activity   Assist  Walk 10 feet activity did not occur: Safety/medical concerns        Walk 50 feet activity   Assist Walk 50 feet with 2 turns activity did not occur: Safety/medical concerns         Walk 150 feet activity   Assist Walk 150 feet activity did not occur: Safety/medical concerns         Walk 10 feet on uneven surface  activity   Assist Walk 10 feet on uneven surfaces activity did not occur:  Safety/medical concerns         Wheelchair     Assist Is the patient using a wheelchair?: Yes Type of Wheelchair: Manual    Wheelchair assist level: Supervision/Verbal cueing Max wheelchair distance: 150'    Wheelchair 50 feet with 2 turns activity    Assist        Assist Level: Supervision/Verbal cueing   Wheelchair 150 feet activity     Assist      Assist Level: Supervision/Verbal cueing   Blood pressure (!) 104/58, pulse 75, temperature 97.9 F (36.6 C), temperature source Oral, resp. rate 16, height 6' (1.829 m), weight 72.8 kg, SpO2 100%.  Medical Problem List and Plan: 1. Functional deficits secondary to left BKA 01/30/2024 after failed TMA with history of PAD status post PCI to left tibia/fibula angioplasty              -patient may  shower             -ELOS/Goals: 7 days with mod I goals for mobility and self-care  Continue CIR therapies including PT, OT   Grounds pass ordered  Team conference today  Should have cardiology follow-up within 1 week because cardiology medications have been changed due to hypotension  2.  Anemia: Hgb reviewed and is downtrending, repeat CBC ordered for today -DVT/anticoagulation:  continue SQ heparin , decrease to q12H given downtrending Hgb, stool occult ordered             -antiplatelet therapy: Aspirin  81 mg daily, Plavix  75 mg daily  3. Pain Management: continue Oxycodone  as needed Continue gabapentin   4. Insomnia: continue prn melatonin  5. Neuropsych/cognition: This patient is capable of making decisions on his own behalf.  -9/7pt appears clearer today. See above  6. Chole site irritation/yellow drainage: consulted IR  -local care to stump. Can use ACE for now for compression if he likes  7. Fluids/Electrolytes/Nutrition: Routine in and out with follow-up chemistries  8.  Aortic stenosis.  Status post TAVR 01/20/2024  9.  End-stage renal disease.  Continue hemodialysis as per renal services              -HD at end of day to allow participation in therapy  - HD T TH S  10.  Hyperlipidemia.  Lipitor  11.  Hypotension: coreg  decreased to HS, decrease imdur  to 60mg  daily.  Monitor with increased mobility    02/11/2024    4:58 AM 02/10/2024    7:46 PM 02/10/2024    4:55 PM  Vitals with BMI  Weight 160 lbs 8 oz  160 lbs 1 oz  BMI 21.76  21.7  Systolic 104 125 883  Diastolic 58 71  63  Pulse 75 92 78   12.  Hypothyroidism.  Continue Synthroid   13.  Acute on chronic anemia: see #2  14.  CAD with history of CABG.  No chest pain or shortness of breath.  Continue Imdur   15.  BPH: d/c flomax  given hypotension  16.  Chronic diastolic congestive heart failure.  Monitor for any signs of fluid overload.  Follow-up per cardiology services.             -daily weights             -volume mgt with HD, weight reviewed and is stable Filed Weights   02/10/24 1311 02/10/24 1655 02/11/24 0458  Weight: 72.6 kg 72.6 kg 72.8 kg    17.  Recent acute cholecystitis June 2025.  Currently maintained on Augmentin  and linezolid  for 3 to 4 weeks and will confirm duration of antibiotic.  Radiology following  for percutaneous cholecystostomy tube for recent cholecystitis. Awaiting plan for continued suction drainage versus change to gravity bag. IR consulted since chole tube site appears irritated     LOS: 6 days A FACE TO FACE EVALUATION WAS PERFORMED  Azizah Lisle P Zonnique Norkus 02/11/2024, 10:16 AM

## 2024-02-11 NOTE — Progress Notes (Signed)
 Occupational Therapy Session Note  Patient Details  Name: Phillip [name] MRN: [id number] Date of Birth: Dec 14, 1955  Today's Date: 02/11/2024 OT Individual Time: 0917-1000 OT Individual Time Calculation (min): 43 min    Short Term Goals: Week 1:  OT Short Term Goal 1 (Week 1): Pt will complete LB bathing with Mod A OT Short Term Goal 2 (Week 1): Pt will complete toilet transfer with Min A OT Short Term Goal 3 (Week 1): Pt will complete LB dressing with Mod A  Skilled Therapeutic Interventions/Progress Updates:    Patient agreeable to participate in OT session. Reports 8/10 pain level premedicated following activity patient received sitting up in bed.   Patient participated in skilled OT session focusing on donning of limb protector. Patient completed transfer to wc with mod A for board placement, CGA for transfer. Completed. Slide board transfer training to mat table with max A for placement in wc and min A for transfer due to incline grade practice. Completed lateral scoots on mat table with lift to increase initiation in sit to stand and ability to perform slide board transfers with increased safety. Handed off to PT following. All needs in reach.     Therapy Documentation Precautions:  Precautions Precautions: Fall Recall of Precautions/Restrictions: Intact Precaution/Restrictions Comments: R chole drain, L BKA Required Braces or Orthoses: Other Brace Other Brace: limb guard Restrictions Weight Bearing Restrictions Per Provider Order: No LLE Weight Bearing Per Provider Order: Non weight bearing  Therapy/Group: Individual Therapy  D'mariea L Yovana Scogin 02/11/2024, 7:26 AM

## 2024-02-11 NOTE — Progress Notes (Signed)
 Physical Therapy Session Note  Patient Details  Name: Phillip [name] MRN: [id number] Date of Birth: 1956-02-04  Today's Date: 02/11/2024 PT Individual Time: 1000-1055 and 8693-8660 PT Individual Time Calculation (min): 55 min and 33 min  Short Term Goals: Week 1:  PT Short Term Goal 1 (Week 1): Pt will perform basic transfers with min A PT Short Term Goal 2 (Week 1): Pt will be able to perform sit <> stands with min A PT Short Term Goal 3 (Week 1): Pt will be able to initiate gait  Skilled Therapeutic Interventions/Progress Updates:   Treatment Session 1 Received pt sitting EOM - handoff from OT. Pt agreeable to PT treatment and reported pain 7/10 in L residual limb - RN notified and present to administer medication. Session with emphasis on limb loss education, functional mobility/transfers, and generalized strengthening and endurance. Transitioned to semi-reclined on wedge and performed the following exercises with emphasis on LE strength/ROM/contracture prevention: -LLE SLR 2x10 -LLE hip abduction 2x10 -R modified sidelying L hip abduction 2x10 (pillow placed at abdomen with caution of drain site) -R modified sidelying L hip extension 2x10 (pt lacking hip extension ROM) -supine modified bridges with LLE supported on wedge 2x8 (Of note, pt unable to lie prone due to drain). Pt transferred supine<>sitting EOM with supervision and increased time/effort and cues for technique. Pt transferred mat<>WC via squat<>pivot with min A and cues for technique and buttocks clearance. Encouraged standing, however pt politely declined due to fatigue from previous OT session and pain.   Provided pt with the following limb loss resources: -[facility] support [facility] -balanced amputee support group -general rehab timeline -shrinker care guidelines -contracture prevention Transported back to room (pt declined propelling due to fatigue) and concluded session with pt sitting in WC, needs within  reach, and seatbelt alarm on.   Treatment Session 2 Received pt sitting in WC finishing lunch with wife at bedside and RN administering medications (pt reporting 7/10 pain in residual limb). Pt requesting to finish lunch - while pt ate provided pt with the following HEP and educated on frequency/duration/technique for the following exercises: -WC pushups 3x10 -sidelying hip and knee AROM 3x10 -SAQ 3x10 -supine hip flexion 3x10 -supine hamstring stretch 3x30 seconds -supine hip abduction 3x10 -sidelying hip abduction 3x10 -single leg bridge 3x10  Pt reported soreness on buttocks from prolonged sitting - located 18x18 Roho cushion and reached out to primary OT to put new cushion in chair during next session. Scott from [employer] arrived with 2XL and 3XL shrinkers. Removed limb guard and 4XL shrinker (noted minimal drainage) and donned 3XL with total A while educating wife on shrinker purpose and care guidelines. Concluded session with pt sitting in WC, needs within reach, and seatbelt alarm on.  Therapy Documentation Precautions:  Precautions Precautions: Fall Recall of Precautions/Restrictions: Intact Precaution/Restrictions Comments: R chole drain, L BKA Required Braces or Orthoses: Other Brace Other Brace: limb guard Restrictions Weight Bearing Restrictions Per Provider Order: No LLE Weight Bearing Per Provider Order: Non weight bearing  Therapy/Group: Individual Therapy [name] Therisa Stains PT, DPT 02/11/2024, 7:02 AM

## 2024-02-12 DIAGNOSIS — Z89512 Acquired absence of left leg below knee: Secondary | ICD-10-CM | POA: Diagnosis not present

## 2024-02-12 LAB — RENAL FUNCTION PANEL
Albumin: 2.3 g/dL — ABNORMAL LOW (ref 3.5–5.0)
Anion gap: 12 (ref 5–15)
BUN: 66 mg/dL — ABNORMAL HIGH (ref 8–23)
CO2: 26 mmol/L (ref 22–32)
Calcium: 8.5 mg/dL — ABNORMAL LOW (ref 8.9–10.3)
Chloride: 93 mmol/L — ABNORMAL LOW (ref 98–111)
Creatinine, Ser: 7.4 mg/dL — ABNORMAL HIGH (ref 0.61–1.24)
GFR, Estimated: 7 mL/min — ABNORMAL LOW (ref >60)
Glucose, Bld: 131 mg/dL — ABNORMAL HIGH (ref 70–99)
Phosphorus: 4.1 mg/dL (ref 2.5–4.6)
Potassium: 4.6 mmol/L (ref 3.5–5.1)
Sodium: 131 mmol/L — ABNORMAL LOW (ref 135–145)

## 2024-02-12 MED ORDER — LIDOCAINE-PRILOCAINE 2.5-2.5 % EX CREA: 1.0000 | TOPICAL_CREAM | CUTANEOUS | Status: DC | PRN

## 2024-02-12 MED ORDER — DOXYCYCLINE HYCLATE 100 MG PO TABS
100.0000 mg | ORAL_TABLET | Freq: Two times a day (BID) | ORAL | Status: DC
  Administered 2024-02-12 – 2024-02-22 (×20): 100 mg via ORAL
  Filled 2024-02-12 (×20): qty 1

## 2024-02-12 MED ORDER — PENTAFLUOROPROP-TETRAFLUOROETH EX AERO: 1.0000 | INHALATION_SPRAY | CUTANEOUS | Status: DC | PRN

## 2024-02-12 MED ORDER — L-METHYLFOLATE-B6-B12 3-35-2 MG PO TABS
1.0000 | ORAL_TABLET | Freq: Every day | ORAL | Status: DC
  Administered 2024-02-12 – 2024-02-22 (×10): 1 via ORAL
  Filled 2024-02-12 (×10): qty 1

## 2024-02-12 MED ORDER — B COMPLEX-C PO TABS
1.0000 | ORAL_TABLET | Freq: Every day | ORAL | Status: DC
  Administered 2024-02-12 – 2024-02-22 (×10): 1 via ORAL
  Filled 2024-02-12 (×10): qty 1

## 2024-02-12 MED ORDER — LIDOCAINE HCL (PF) 1 % IJ SOLN: 5.0000 mL | INTRAMUSCULAR | Status: DC | PRN

## 2024-02-12 MED ORDER — HEPARIN SODIUM (PORCINE) 1000 UNIT/ML DIALYSIS: 1000.0000 [IU] | INTRAMUSCULAR | Status: DC | PRN

## 2024-02-12 MED ORDER — OXYCODONE HCL 5 MG PO TABS
5.0000 mg | ORAL_TABLET | ORAL | Status: DC | PRN
  Administered 2024-02-12 – 2024-02-13 (×4): 5 mg via ORAL
  Filled 2024-02-12 (×3): qty 1

## 2024-02-12 MED ORDER — HEPARIN SODIUM (PORCINE) 1000 UNIT/ML DIALYSIS
1000.0000 [IU] | INTRAMUSCULAR | Status: DC | PRN
Start: 2024-02-12 — End: 2024-02-12

## 2024-02-12 MED ORDER — ALTEPLASE 2 MG IJ SOLR: 2.0000 mg | Freq: Once | INTRAMUSCULAR | Status: DC | PRN

## 2024-02-12 MED ORDER — ISOSORBIDE MONONITRATE ER 30 MG PO TB24
30.0000 mg | ORAL_TABLET | Freq: Every day | ORAL | Status: DC
  Administered 2024-02-13 – 2024-02-22 (×9): 30 mg via ORAL
  Filled 2024-02-12 (×11): qty 1

## 2024-02-12 MED ORDER — VITAMIN D 25 MCG (1000 UNIT) PO TABS
1000.0000 [IU] | ORAL_TABLET | Freq: Every day | ORAL | Status: DC
  Administered 2024-02-12 – 2024-02-22 (×10): 1000 [IU] via ORAL
  Filled 2024-02-12 (×10): qty 1

## 2024-02-12 MED ORDER — DARBEPOETIN ALFA 100 MCG/0.5ML IJ SOSY: 100.0000 ug | PREFILLED_SYRINGE | INTRAMUSCULAR | Status: DC

## 2024-02-12 MED ORDER — ANTICOAGULANT SODIUM CITRATE 4% (200MG/5ML) IV SOLN: 5.0000 mL | Status: DC | PRN

## 2024-02-12 NOTE — Progress Notes (Signed)
 Speech Language Pathology Daily Session Note  Patient Details  Name: Phillip [name] MRN: [id number] Date of Birth: 21-Feb-1956  Today's Date: 02/12/2024 SLP Individual Time: 1100-1155 SLP Individual Time Calculation (min): 55 min  Short Term Goals: Week 1: SLP Short Term Goal 1 (Week 1): STGs=LTGs d/t ELOS  Skilled Therapeutic Interventions:   SLP conducted skilled therapy session targeting cognition goals. Upon clinician arrival, patient hesitant to complete therapy session due to fatigue. Through verbal encouragement, patient agreed to participate. SLP facilitated three functional activities to target problem solving and working memory. Patient completed 4-5 step sequences with mod assist for 8/8 trials. He required cues to thoroughly read each choice before selecting an answer and review his answers. Patient then completed scanning/attention to detail task. He benefited from mod assistance for attention to detail and thoroughness. Lastly, patient worked on working memory through Radiographer, therapeutic task. He was modI for calculations but required min assist to identify specific types of coins/bills due to vision impairments. Patient was left in room with call bell in reach and alarm set. SLP will continue to target goals per plan of care.     Pain Pain Assessment Pain Scale: 0-10 Pain Score: 0-No pain Faces Pain Scale: Hurts a little bit Pain Location: Knee PAINAD (Pain Assessment in Advanced Dementia) Breathing: normal Negative Vocalization: none Facial Expression: smiling or inexpressive Body Language: relaxed  Therapy/Group: Individual Therapy  Ronney Honeywell Kwakye-Ndlovu 02/12/2024, 2:05 PM

## 2024-02-12 NOTE — Plan of Care (Signed)
  Problem: Consults Goal: RH LIMB LOSS PATIENT EDUCATION Description: Description: See Patient Education module for eduction specifics. 02/12/2024 1202 by Aleta Mike, RN Outcome: Progressing 02/12/2024 1201 by Aleta Mike, RN Outcome: Progressing   Problem: RH BOWEL ELIMINATION Goal: RH STG MANAGE BOWEL WITH ASSISTANCE Description: STG Manage Bowel with mod I Assistance. 02/12/2024 1202 by Aleta Mike, RN Outcome: Progressing 02/12/2024 1201 by Aleta Mike, RN Outcome: Progressing   Problem: RH SKIN INTEGRITY Goal: RH STG SKIN FREE OF INFECTION/BREAKDOWN 02/12/2024 1202 by Aleta Mike, RN Outcome: Progressing 02/12/2024 1201 by Aleta Mike, RN Outcome: Progressing   Problem: RH SAFETY Goal: RH STG ADHERE TO SAFETY PRECAUTIONS W/ASSISTANCE/DEVICE Description: STG Adhere to Safety Precautions With mod I Assistance/Device. 02/12/2024 1202 by Aleta Mike, RN Outcome: Progressing 02/12/2024 1201 by Aleta Mike, RN Outcome: Progressing   Problem: RH PAIN MANAGEMENT Goal: RH STG PAIN MANAGED AT OR BELOW PT'S PAIN GOAL Description: <4 w/ prns 02/12/2024 1202 by Aleta Mike, RN Outcome: Progressing 02/12/2024 1201 by Aleta Mike, RN Outcome: Progressing   Problem: RH KNOWLEDGE DEFICIT LIMB LOSS Goal: RH STG INCREASE KNOWLEDGE OF SELF CARE AFTER LIMB LOSS Description: Manage increase knowledge of self carte after limb loss with mod I assistance from wife using educational materials provided 02/12/2024 1202 by Aleta Mike, RN Outcome: Progressing 02/12/2024 1201 by Aleta Mike, RN Outcome: Progressing

## 2024-02-12 NOTE — Progress Notes (Signed)
 PROGRESS NOTE   Subjective/Complaints: No new complaints this morning Somnolent Hgb reviewed from yesterday and is steady at 8.3  ROS: Patient denies fever, rash, sore throat, blurred vision, dizziness, nausea, vomiting, diarrhea, cough, shortness of breath or chest pain, back/neck pain, headache, or mood change.   Objective:   No results found.   Recent Labs    02/10/24 0444 02/11/24 1853  WBC 5.3 4.9  HGB 8.3* 8.3*  HCT 25.3* 26.8*  PLT 83* 74*   Recent Labs    02/10/24 0444  NA 129*  K 5.2*  CL 89*  CO2 23  GLUCOSE 91  BUN 95*  CREATININE 9.37*  CALCIUM  8.0*    Intake/Output Summary (Last 24 hours) at 02/12/2024 1020 Last data filed at 02/12/2024 0916 Gross per 24 hour  Intake 738 ml  Output 10 ml  Net 728 ml        Physical Exam: Vital Signs Blood pressure 105/69, pulse 80, temperature 98 F (36.7 C), temperature source Oral, resp. rate 14, height 6' (1.829 m), weight 69.9 kg, SpO2 100%.  Constitutional: No distress . Vital signs reviewed. HEENT: NCAT, EOMI, oral membranes moist Neck: supple Cardiovascular: RRR without murmur. No JVD    Respiratory/Chest: CTA Bilaterally without wheezes or rales. Normal effort    GI/Abdomen: BS +, non-tender, non-distended, perc chole tube Ext: no clubbing, cyanosis, or edema Psych: pleasant and cooperative  Skin: Left aVF, left BK with s/s drainage, gauze, ACE wrap in place Neuro: Alert and oriented to person, place, reasons, month/year. More attentive. Better insight and awareness. Speech clearer. Cranial nerves II through XII grossly intact. Bilateral upper extremity strength 5 out of 5. Right lower extremity strength 4 out of 5 proximal, 5 out of 5 distal. Left lower extremity at least 3-5, stable 9/11 Sensation intact light touch in all extremities  Assessment/Plan: 1. Functional deficits which require 3+ hours per day of interdisciplinary therapy in a  comprehensive inpatient rehab setting. Physiatrist is providing close team supervision and 24 hour management of active medical problems listed below. Physiatrist and rehab team continue to assess barriers to discharge/monitor patient progress toward functional and medical goals  Care Tool:  Bathing    Body parts bathed by patient: Right arm, Left arm, Chest, Abdomen, Front perineal area, Buttocks, Right upper leg, Left upper leg, Face   Body parts bathed by helper: Right lower leg Body parts n/a: Left lower leg   Bathing assist Assist Level: Minimal Assistance - Patient > 75%     Upper Body Dressing/Undressing Upper body dressing   What is the patient wearing?: Pull over shirt    Upper body assist Assist Level: Set up assist    Lower Body Dressing/Undressing Lower body dressing      What is the patient wearing?: Underwear/pull up, Pants, Ace wrap/stump shrinker     Lower body assist Assist for lower body dressing: Moderate Assistance - Patient 50 - 74%     Toileting Toileting    Toileting assist Assist for toileting: Maximal Assistance - Patient 25 - 49%     Transfers Chair/bed transfer  Transfers assist     Chair/bed transfer assist level: Minimal Assistance - Patient >  75%     Locomotion Ambulation   Ambulation assist   Ambulation activity did not occur: Safety/medical concerns          Walk 10 feet activity   Assist  Walk 10 feet activity did not occur: Safety/medical concerns        Walk 50 feet activity   Assist Walk 50 feet with 2 turns activity did not occur: Safety/medical concerns         Walk 150 feet activity   Assist Walk 150 feet activity did not occur: Safety/medical concerns         Walk 10 feet on uneven surface  activity   Assist Walk 10 feet on uneven surfaces activity did not occur: Safety/medical concerns         Wheelchair     Assist Is the patient using a wheelchair?: Yes Type of Wheelchair:  Manual    Wheelchair assist level: Supervision/Verbal cueing Max wheelchair distance: 150'    Wheelchair 50 feet with 2 turns activity    Assist        Assist Level: Supervision/Verbal cueing   Wheelchair 150 feet activity     Assist      Assist Level: Supervision/Verbal cueing   Blood pressure 105/69, pulse 80, temperature 98 F (36.7 C), temperature source Oral, resp. rate 14, height 6' (1.829 m), weight 69.9 kg, SpO2 100%.  Medical Problem List and Plan: 1. Functional deficits secondary to left BKA 01/30/2024 after failed TMA with history of PAD status post PCI to left tibia/fibula angioplasty              -patient may  shower             -ELOS/Goals: 7 days with mod I goals for mobility and self-care  Chart and therapy notes reviewed, continue CIR, discussed patient's progress with therapy Grounds pass ordered Vitamin D3/Metanx/Vitamin B/C complex ordered F/u with me or Fidela in clinic within 1 month Would benefit from home health aide upon discharge   Should have cardiology follow-up within 1 week because cardiology medications have been changed due to hypotension  2.  Anemia: Hgb reviewed and is stable at 8.3, continue SQ heparin  q12H, stool occult ordered             -antiplatelet therapy: Aspirin  81 mg daily, Plavix  75 mg daily  3. Pain Management: decrease oxycodone  to 5mg  Continue gabapentin   4. Insomnia: continue prn melatonin  5. Neuropsych/cognition: This patient is capable of making decisions on his own behalf.  -9/7pt appears clearer today. See above  6. Chole site irritation/yellow drainage: consulted IR  -local care to stump. Can use ACE for now for compression if he likes  7. Fluids/Electrolytes/Nutrition: Routine in and out with follow-up chemistries  8.  Aortic stenosis.  Status post TAVR 01/20/2024  9.  End-stage renal disease.  Continue hemodialysis as per renal services             -HD at end of day to allow participation in  therapy  - HD T TH S  10.  Hyperlipidemia.  Lipitor  11.  Hypotension: coreg  decreased to HS, decrease imdur  to 60mg  daily.  Monitor with increased mobility    02/12/2024    7:12 AM 02/12/2024    5:11 AM 02/12/2024    4:26 AM  Vitals with BMI  Weight  154 lbs 2 oz   BMI  20.9   Systolic 105  114  Diastolic 69  65  Pulse  80   12.  Hypothyroidism.  Continue Synthroid   13.  Acute on chronic anemia: see #2  14.  CAD with history of CABG.  No chest pain or shortness of breath.  Continue Imdur   15.  BPH: d/c flomax  given hypotension, resolved  16.  Chronic diastolic congestive heart failure.  Monitor for any signs of fluid overload.  Follow-up per cardiology services.             -daily weights             -volume mgt with HD, weight reviewed and is decreased Filed Weights   02/10/24 1655 02/11/24 0458 02/12/24 0511  Weight: 72.6 kg 72.8 kg 69.9 kg    17.  Recent acute cholecystitis June 2025.  Currently maintained on Augmentin  and linezolid  for 3 to 4 weeks and will confirm duration of antibiotic.  Radiology following  for percutaneous cholecystostomy tube for recent cholecystitis. Awaiting plan for continued suction drainage versus change to gravity bag. IR consulted since chole tube site appears irritated, reviewed their note- recommending to maintain drain     LOS: 7 days A FACE TO FACE EVALUATION WAS PERFORMED  Merwin Breden P Verdene Creson 02/12/2024, 10:20 AM

## 2024-02-12 NOTE — Progress Notes (Signed)
 Physical Therapy Session Note  Patient Details  Name: Phillip [name] MRN: [id number] Date of Birth: [birth year]  Today's Date: 02/12/2024 PT Individual Time: 9054-8946 PT Individual Time Calculation (min): 68 min   Short Term Goals: Week 1:  PT Short Term Goal 1 (Week 1): Pt will perform basic transfers with min A PT Short Term Goal 2 (Week 1): Pt will be able to perform sit <> stands with min A PT Short Term Goal 3 (Week 1): Pt will be able to initiate gait  Skilled Therapeutic Interventions/Progress Updates:   Received pt sitting in WC, pt agreeable to PT treatment, and reported fatigue pain 7/10 in L residual limb (premedicated). Session with emphasis on functional mobility/transfers, generalized strengthening and endurance, dynamic standing balance/coordination, and limb loss education. Pt performed WC mobility 154ft x 2 trials using BUE and supervision to/from main therapy gym with emphasis on UE strength/coordination. Pt transferred WC<>mat via lateral scoot to R with CGA with assist to stabilize WC. With encouragement, pt stood from 25in high mat with RW and heavy mod A x 4 trials using mirror for visual feedback - cues for upright posture/gaze, R knee extension, and anterior weight shifting - pt with posterior bias and relying heavily  on support of mat for balance.  Pt performed the following seated exercises with emphasis on LE/UE/core strength/ROM: - seated L knee flexion/extension AROM 2x12 - seated L hip flexion AROM 2x12  - seated lateral trunk rotations with 4.4lb medicine ball 2x10 bilaterally - bicep curls with 5lb dumbbell 2x10 bilaterally - hip abduction with red TB 2x15 - R knee flexion with red TB 2x15 Pt transferred mat<>WC to L via squat<>pivot with light mod A due to difficulty clearing buttocks. Returned to room and concluded session with pt sitting in WC, needs within reach, and seatbelt alarm on.   Therapy Documentation Precautions:  Precautions Precautions:  Fall Recall of Precautions/Restrictions: Intact Precaution/Restrictions Comments: R chole drain, L BKA Required Braces or Orthoses: Other Brace Other Brace: limb guard Restrictions Weight Bearing Restrictions Per Provider Order: No LLE Weight Bearing Per Provider Order: Partial weight bearing  Therapy/Group: Individual Therapy [name] Therisa Stains PT, DPT 02/12/2024, 6:57 AM

## 2024-02-12 NOTE — Progress Notes (Signed)
  Received patient in bed to unit.   Informed consent signed and in chart.    TX duration:3.25 Did not run all of treatment, ran 30 min     Transported by  Hand-off given to patient's nurse.    Access used: Left AVF Access issues: Venous needle infiltrate. Ice pack placed on access for 20 min. Instructions to nurse to use on arm 2 more times this evening.   Total UF removed: 0 Medication(s) given: None Post HD VS: 106/58      Hunter Hacking LPN [facility]

## 2024-02-12 NOTE — Progress Notes (Signed)
 Occupational Therapy Session Note  Patient Details  Name: Phillip [name] MRN: [id number] Date of Birth: [birth year]  Today's Date: 02/12/2024 OT Individual Time: 9199-9141 OT Individual Time Calculation (min): 58 min    Short Term Goals: Week 1:  OT Short Term Goal 1 (Week 1): Pt will complete LB bathing with Mod A OT Short Term Goal 2 (Week 1): Pt will complete toilet transfer with Min A OT Short Term Goal 3 (Week 1): Pt will complete LB dressing with Mod A  Skilled Therapeutic Interventions/Progress Updates:  Skilled OT session completed to address ADL retraining, functional transfers and strengthening. Pt received seated EOB, agreeable to participate in therapy. Pt reports no pain.  Pt self-initates shrinker care displaying carryover of limb loss education from PT/OT services. Pt doffed shrinker with VC, requiring increased encouragement d/t fear of pain. OT performed shrinker care and hung to air dry in bathroom. Pt donned 3XL shrinker and limb guard with Min A. EOB>WC SB CGA, pt able to position board with independence. Pt manages leg rests and arm rests with VC to access levers. Self-propels to sink to perform oral care and grooming with independence.   Pt self-propels 69ft+20ft with 1 rest break. WC>Mat SB Supervision A to position SB prior to transfer. Pt instructed on seated weight shifting activity to simulate seated toilet hygiene. OT places 10 cards under pt to retrieve.  Pt removes 10/10 cards by laterally weight shifting and laterally reaching under bottom requiring VC to locate cards at R posterior towards ischial tuberosity. Pt declines practice at toilet today, will f/u with next treatment. Pt performs 5 bottom raises with VC to clear bottom from mat in prep for scoot pivot transfer to WC. Pt scoots to EOM with CGA. Mat>WC with CGA, VC to continue transfer as pt stopped mid transfer d/t fatigue. Pt reports preference for SB transfers, will continue practice with scoot pivot  transfers.   Pt self-propels 87ft+20ft with 1 rest break. Pt returned to room seated in [facility], chair alarm on, all needs met.    Therapy Documentation Precautions:  Precautions Precautions: Fall Recall of Precautions/Restrictions: Intact Precaution/Restrictions Comments: R chole drain, L BKA Required Braces or Orthoses: Other Brace Other Brace: limb guard Restrictions Weight Bearing Restrictions Per Provider Order: No LLE Weight Bearing Per Provider Order: Partial weight bearing    Therapy/Group: Individual Therapy  Syanne Looney Woods-Chance, MS, OTR/L 02/12/2024, 8:22 AM

## 2024-02-12 NOTE — Progress Notes (Signed)
 Nutrition Follow Up  DOCUMENTATION CODES:   Severe malnutrition in context of chronic illness (ESRD on HD)  INTERVENTION:  Nepro Shake po TID, each supplement provides 425 kcal and 19 grams protein  RenaVit daily  Juven BID, each packet provides 95 calories, 2.5 grams of protein (collagen), and 9.8 grams of carbohydrate (3 grams sugar); also contains 7 grams of L-arginine and L-glutamine, 300 mg vitamin C, 15 mg vitamin E, 1.2 mcg vitamin B-12, 9.5 mg zinc , 200 mg calcium , and 1.5 g  Calcium  Beta-hydroxy-Beta-methylbutyrate to support wound healing  Encouraged adequate intake that will help meet increased needs for wound healing    NUTRITION DIAGNOSIS:   Severe Malnutrition related to chronic illness (ESRD on HD) as evidenced by severe muscle depletion, severe fat depletion, energy intake < or equal to 75% for > or equal to 1 month. Remains applicable  GOAL:   Patient will meet greater than or equal to 90% of their needs Progressing  MONITOR:   PO intake, Supplement acceptance  REASON FOR ASSESSMENT:   Consult Wound healing  ASSESSMENT:   Pt with hx of ESRD on HD, aortic stenosis, HLD, HTN, CAD, diabetes, and CHF. Recent admission where pt underwent L BKA 8/29. Admitted to CIR for functional deficits following BKA.  Pt will continue HD treatments TTS while admitted. Pt currently on fluid restriction, will appreciate nephrology's recommendations for fluid.   Average Meal Completion: 9/8-9/11: 61% average intake x 8 recorded meals   Medications reviewed and include:  Prosource Plus 30mL BID Nepro TID Levothyroxine  Rena Vit daily Juven BID Renvela   Labs reviewed  Diet Order:   Diet Order             Diet renal with fluid restriction Fluid restriction: 1200 mL Fluid; Room service appropriate? Yes; Fluid consistency: Thin  Diet effective now                   EDUCATION NEEDS:   Education needs have been addressed  Skin:  Skin Assessment: Skin  Integrity Issues: Skin Integrity Issues:: Incisions Incisions: L BKA  Last BM:  9/9  Height:   Ht Readings from Last 1 Encounters:  02/05/24 6' (1.829 m)    Weight:   Wt Readings from Last 1 Encounters:  02/12/24 69.9 kg    Ideal Body Weight:  75.6 kg 80.9 kg - 5.3 kg (to account for 6.5% loss from BKA)  BMI:  Body mass index is 20.9 kg/m.  Estimated Nutritional Needs:   Kcal:  2000-2200  Protein:  90-105g  Fluid:  2-2.2L    Josette Glance, MS, RDN, LDN Clinical Dietitian I Please reach out via secure chat

## 2024-02-12 NOTE — Progress Notes (Addendum)
 [facility] Progress Note   Subjective:    Seen and examined patient at bedside. Feels well with no complaints. Plan for HD this afternoon.  Objective Vitals:   02/11/24 1950 02/12/24 0426 02/12/24 0511 02/12/24 0712  BP: 119/67 114/65  105/69  Pulse: 86 80    Resp: 18 14    Temp: 98.4 F (36.9 C) 98 F (36.7 C)    TempSrc: Oral Oral    SpO2: 100% 100%    Weight:   69.9 kg   Height:       Physical Exam General: Well appearing man, thin, NAD Heart: RRR Lungs: CTAB Abdomen: soft, R per chole tube in place Extremities: L BKA, no LE edema Dialysis Access: LUE AVF +t/b  Filed Weights   02/10/24 1655 02/11/24 0458 02/12/24 0511  Weight: 72.6 kg 72.8 kg 69.9 kg    Intake/Output Summary (Last 24 hours) at 02/12/2024 1118 Last data filed at 02/12/2024 0916 Gross per 24 hour  Intake 738 ml  Output 10 ml  Net 728 ml    Additional Objective Labs: Basic Metabolic Panel: Recent Labs  Lab 02/08/24 0500 02/09/24 0515 02/10/24 0444  NA 131* 129* 129*  K 5.0 5.2* 5.2*  CL 90* 91* 89*  CO2 27 25 23   GLUCOSE 97 110* 91  BUN 41* 69* 95*  CREATININE 6.60* 8.10* 9.37*  CALCIUM  8.3* 8.2* 8.0*  PHOS 4.6 5.2* 5.7*   Liver Function Tests: Recent Labs  Lab 02/08/24 0500 02/09/24 0515 02/10/24 0444  ALBUMIN  2.2* 2.2* 2.2*   No results for input(s): LIPASE, AMYLASE in the last 168 hours. CBC: Recent Labs  Lab 02/05/24 1936 02/05/24 1936 02/08/24 0500 02/09/24 0515 02/10/24 0444 02/11/24 1853  WBC 9.0  --  5.9 5.2 5.3 4.9  NEUTROABS  --    < > 4.1 3.7 3.6 3.4  HGB 9.4*  --  8.8* 8.7* 8.3* 8.3*  HCT 31.6*  --  28.6* 28.1* 25.3* 26.8*  MCV 96.6  --  96.6 96.9 95.5 96.4  PLT 168  --  113* 103* 83* 74*   < > = values in this interval not displayed.   Blood Culture    Component Value Date/Time   SDES BILE 11/12/2023 1247   SPECREQUEST NONE 11/12/2023 1247   CULT MODERATE STREPTOCOCCUS SALIVARIUS 11/12/2023 1247   REPTSTATUS 11/14/2023 FINAL  11/12/2023 1247    Cardiac Enzymes: No results for input(s): CKTOTAL, CKMB, CKMBINDEX, TROPONINI in the last 168 hours. CBG: Recent Labs  Lab 02/05/24 1126 02/05/24 1706  GLUCAP 134* 124*   Iron  Studies: No results for input(s): IRON , TIBC, TRANSFERRIN, FERRITIN in the last 72 hours. Lab Results  Component Value Date   INR 1.1 02/09/2024   INR 1.1 01/19/2024   INR 1.3 (H) 11/12/2023   Studies/Results: No results found.  Medications:   (feeding supplement) PROSource Plus  30 mL Oral BID BM   amoxicillin -clavulanate  1 tablet Oral QHS   aspirin  EC  81 mg Oral Daily   atorvastatin   20 mg Oral Daily   B-complex with vitamin C  1 tablet Oral Daily   carvedilol   3.125 mg Oral QHS   Chlorhexidine  Gluconate Cloth  6 each Topical Q0600   cholecalciferol   1,000 Units Oral Daily   clopidogrel   75 mg Oral Daily   gabapentin   200 mg Oral QHS   heparin  injection (subcutaneous)  5,000 Units Subcutaneous Q12H   isosorbide  mononitrate  60 mg Oral Daily   l-methylfolate-B6-B12  1 tablet Oral  Daily   latanoprost   1 drop Both Eyes QHS   levothyroxine   50 mcg Oral Daily   linezolid   600 mg Oral Q12H   nutrition supplement (JUVEN)  1 packet Oral BID BM   sevelamer  carbonate  800 mg Oral TID WC    Dialysis Orders: TTS [facility] 4hr, 350/800, EDW 81kg, 2K/2.5Ca, L AVF, no heparin  - no VDRA, ESA ordered but not yet given  Assessment/Plan: 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 Per VVS. Severe aortic stenosis s/p TAVR 01/20/24: Zio monitor in place. ESRD: HD TTS. Next HD this afternoon. Vascular Access: S/p AVF infiltration and difficulty cannulating at his outpatient HD center. F'gram completed 9/8 - normal/patent AVF. HTN/volume: BP soft, on low dose BB. Will lower isosorbide  to 30mg  daily today. Well below EDW, lower on discharge. Anemia of ESRD: Hgb 8.3, s/p Aranesp  on 9/3 - monitor. Will raise dose today. Secondary  HPTH: CorrCa ok and phos mildly elevated- continue sevelamer  as binder. Nutrition: Alb low, continue supps. Hx cholecystitis with perc chole drain still in place: Erythema/purulence around drain, on PO abx. S/p CT without abscess, and now s/p tube exchange. T2DM Dispo: In CIR.  Phillip Piety, NP [facility] 02/12/2024,11:18 AM  LOS: 7 days

## 2024-02-12 NOTE — Plan of Care (Signed)
  Problem: Consults Goal: RH LIMB LOSS PATIENT EDUCATION Description: Description: See Patient Education module for eduction specifics. Outcome: Progressing   Problem: RH BOWEL ELIMINATION Goal: RH STG MANAGE BOWEL WITH ASSISTANCE Description: STG Manage Bowel with mod I Assistance. Outcome: Progressing   Problem: RH SKIN INTEGRITY Goal: RH STG SKIN FREE OF INFECTION/BREAKDOWN Outcome: Progressing   Problem: RH SAFETY Goal: RH STG ADHERE TO SAFETY PRECAUTIONS W/ASSISTANCE/DEVICE Description: STG Adhere to Safety Precautions With mod I Assistance/Device. Outcome: Progressing   Problem: RH PAIN MANAGEMENT Goal: RH STG PAIN MANAGED AT OR BELOW PT'S PAIN GOAL Description: <4 w/ prns Outcome: Progressing   Problem: RH KNOWLEDGE DEFICIT LIMB LOSS Goal: RH STG INCREASE KNOWLEDGE OF SELF CARE AFTER LIMB LOSS Description: Manage increase knowledge of self carte after limb loss with mod I assistance from wife using educational materials provided Outcome: Progressing

## 2024-02-13 MED ORDER — OXYCODONE HCL 5 MG PO TABS
5.0000 mg | ORAL_TABLET | Freq: Four times a day (QID) | ORAL | Status: DC | PRN
  Administered 2024-02-13 – 2024-02-18 (×15): 5 mg via ORAL
  Filled 2024-02-13 (×15): qty 1

## 2024-02-13 MED ORDER — DARBEPOETIN ALFA 100 MCG/0.5ML IJ SOSY
100.0000 ug | PREFILLED_SYRINGE | INTRAMUSCULAR | Status: DC
  Administered 2024-02-13 – 2024-02-20 (×2): 100 ug via SUBCUTANEOUS
  Filled 2024-02-13 (×2): qty 0.5

## 2024-02-13 MED ORDER — OXYCODONE HCL 5 MG PO TABS
ORAL_TABLET | ORAL | Status: AC
  Filled 2024-02-13: qty 1

## 2024-02-13 MED ORDER — CHLORHEXIDINE GLUCONATE CLOTH 2 % EX PADS
6.0000 | MEDICATED_PAD | Freq: Every day | CUTANEOUS | Status: DC
Start: 2024-02-13 — End: 2024-02-19
  Administered 2024-02-13 – 2024-02-19 (×7): 6 via TOPICAL

## 2024-02-13 NOTE — Progress Notes (Signed)
 Physical Therapy Group Session Note  Patient Details  Name: Phillip [name] MRN: [id number] Date of Birth: [birth year]  Today's Date: 02/13/2024 PT Group Time: 1100-1200 PT Group Time Calculation (min): 60 min  Short Term Goals: Week 1:  PT Short Term Goal 1 (Week 1): Pt will perform basic transfers with min A PT Short Term Goal 2 (Week 1): Pt will be able to perform sit <> stands with min A PT Short Term Goal 3 (Week 1): Pt will be able to initiate gait  Skilled Therapeutic Interventions/Progress Updates:   Pt participated in limb loss group in social setting with emphasis on stress management and coping strategies to manage new diagnosis to allow for improved mental health to improve overall quality of life. Provided active listening, emotional support and therapeutic use of self. Education provided on prosthetic fitting timeline as well as pain management strategies including desensitization techniques and mirror therapy. Issued and reviewed handouts on balanced amputee wellness and educational communities, amputee support group of the triad, contracture prevention, limb protector, and shrinker wear/care guidelines. Also discussed activity modification in relation to pt's goals and hobbies. Pt actively engaged throughout and appreciative of group session.  Therapy Documentation Precautions:  Precautions Precautions: Fall Recall of Precautions/Restrictions: Intact Precaution/Restrictions Comments: R chole drain, L BKA Required Braces or Orthoses: Other Brace Other Brace: limb guard Restrictions Weight Bearing Restrictions Per Provider Order: No LLE Weight Bearing Per Provider Order: Partial weight bearing  Therapy/Group: Group Therapy [name] Therisa Stains PT, DPT 02/13/2024, 7:05 AM

## 2024-02-13 NOTE — Progress Notes (Signed)
 Occupational Therapy Session Note  Patient Details  Name: Phillip [name] MRN: [id number] Date of Birth: 06-Aug-1955  Today's Date: 02/13/2024 OT Individual Time: 9092-8997 OT Individual Time Calculation (min): 55 min    Short Term Goals: Week 2:  OT Short Term Goal 1 (Week 2): LTG=STG d/t ELOS  Skilled Therapeutic Interventions/Progress Updates:  Skilled OT session completed to address ADL retraining. Pt received seated EOB, agreeable to participate in therapy. Pt reports 8/10 pain, pre medicated prior to OT session.   EOB>WC scoot pivot transfer Min A, self propels WC to perform ADLs at sink. Pt picked out clothing at EOB and places in lap when propelling. UB don/doff performed with Touch A to manage R chole drain. UB bathing performed Mod-I. LB dressing performed Mod A seated in WC, pt presents with increase fatigue this session d/t returning from HD at midnight and required more A than normal. Pt cued to perform bottom push ups to remove pants in WC, Pt could not reach back in chair, OT provided A. OT provided education on energy conservation and taking rest breaks when recognizing different energy levels, pt displayed understanding by prompting for a rest break. Pt don/doffed 3XL shrinker and limb guard with independence, OT prompted pt to verbalize shrinker management, pt successfully verbalizes 3/3 steps for shrinker management with independence. OT performs shrinker care at sink. Footwear don/doff Mod A. Pt denies need to void, at conclusion of session, pt seated in [facility], chair alarm on and NT present with all needs in reach.    Therapy Documentation Precautions:  Precautions Precautions: Fall Recall of Precautions/Restrictions: Intact Precaution/Restrictions Comments: R chole drain, L BKA Required Braces or Orthoses: Other Brace Other Brace: limb guard Restrictions Weight Bearing Restrictions Per Provider Order: No LLE Weight Bearing Per Provider Order: Partial weight  bearing  Therapy/Group: Individual Therapy  Tyjae Shvartsman Woods-Chance, MS, OTR/L 02/13/2024, 7:51 AM

## 2024-02-13 NOTE — Progress Notes (Signed)
 Received patient in bed to unit.  Alert and oriented.  Informed consent signed and in chart.   TX duration: 3hours72min  Patient tolerated well.  Transported back to the room  Alert, without acute distress.  Hand-off given to patient's nurse.   Access used: Fistula Access issues: none  Total UF removed: 0 ml Medication(s) given: oxy 5mg  Post HD VS: 119/67 Post HD weight: unable to obtain.   02/13/24 0156  Vitals  Temp 98.3 F (36.8 C)  Temp Source Oral  BP 119/67  MAP (mmHg) 83  BP Location Right Arm  BP Method Automatic  Patient Position (if appropriate) Lying  Pulse Rate 92  Pulse Rate Source Monitor  ECG Heart Rate 89  Resp 18  Oxygen Therapy  SpO2 100 %  O2 Device Room Air  Patient Activity (if Appropriate) In bed  Pulse Oximetry Type Continuous  During Treatment Monitoring  Blood Flow Rate (mL/min) 0 mL/min  Arterial Pressure (mmHg) -1.21 mmHg  Venous Pressure (mmHg) -2.42 mmHg  TMP (mmHg) -51.11 mmHg  Ultrafiltration Rate (mL/min) 269 mL/min  Dialysate Flow Rate (mL/min) 0 ml/min  Duration of HD Treatment -hour(s) 3.25 hour(s)  Cumulative Fluid Removed (mL) per Treatment  0.07  Post Treatment  Dialyzer Clearance Lightly streaked  Hemodialysis Intake (mL) 0 mL  Liters Processed 77.6  Fluid Removed (mL) 0 mL  Tolerated HD Treatment Yes  Post-Hemodialysis Comments (S)  HD tx achieved as expected, tolerated well, pt is stable.  AVG/AVF Arterial Site Held (minutes) 10 minutes  AVG/AVF Venous Site Held (minutes) 10 minutes  Note  Patient Observations (S)  pt is in bed resting comfortably  Fistula / Graft Left Upper arm  Placement Date/Time: 01/21/24 0000   Placed prior to admission: Yes  Orientation: Left  Access Location: Upper arm  Site Condition No complications  Fistula / Graft Assessment Present;Thrill;Bruit  Status Deaccessed  Drainage Description None      Thy Gullikson [facility]

## 2024-02-13 NOTE — Progress Notes (Signed)
 [facility] Progress Note   Subjective:    Seen and examined patient overnight. Informed yesterday later afternoon HD staff were having issues with cannulating; however, it appears patient was able to dialyze successfully overnight. F'gram from 9/8 was normal/patent. He continues to feel well. Denies SOB/CP. Next HD 9/12.  Objective Vitals:   02/13/24 0135 02/13/24 0156 02/13/24 0306 02/13/24 0545  BP:  119/67 136/61   Pulse: 79 92 90   Resp: 11 18 18    Temp:  98.3 F (36.8 C) 97.8 F (36.6 C)   TempSrc:  Oral Oral   SpO2: 100% 100% 100%   Weight:    69.9 kg  Height:       Physical Exam General: Well appearing man, thin, NAD Heart: RRR Lungs: CTAB Abdomen: soft, R per chole tube in place Extremities: L BKA, no LE edema Dialysis Access: LUE AVF +t/b  Filed Weights   02/12/24 0511 02/12/24 1515 02/13/24 0545  Weight: 69.9 kg 71.1 kg 69.9 kg    Intake/Output Summary (Last 24 hours) at 02/13/2024 1011 Last data filed at 02/13/2024 0820 Gross per 24 hour  Intake 458 ml  Output 275 ml  Net 183 ml    Additional Objective Labs: Basic Metabolic Panel: Recent Labs  Lab 02/09/24 0515 02/10/24 0444 02/12/24 1217  NA 129* 129* 131*  K 5.2* 5.2* 4.6  CL 91* 89* 93*  CO2 25 23 26   GLUCOSE 110* 91 131*  BUN 69* 95* 66*  CREATININE 8.10* 9.37* 7.40*  CALCIUM  8.2* 8.0* 8.5*  PHOS 5.2* 5.7* 4.1   Liver Function Tests: Recent Labs  Lab 02/09/24 0515 02/10/24 0444 02/12/24 1217  ALBUMIN  2.2* 2.2* 2.3*   No results for input(s): LIPASE, AMYLASE in the last 168 hours. CBC: Recent Labs  Lab 02/08/24 0500 02/09/24 0515 02/10/24 0444 02/11/24 1853  WBC 5.9 5.2 5.3 4.9  NEUTROABS 4.1 3.7 3.6 3.4  HGB 8.8* 8.7* 8.3* 8.3*  HCT 28.6* 28.1* 25.3* 26.8*  MCV 96.6 96.9 95.5 96.4  PLT 113* 103* 83* 74*   Blood Culture    Component Value Date/Time   SDES BILE 11/12/2023 1247   SPECREQUEST NONE 11/12/2023 1247   CULT MODERATE STREPTOCOCCUS  SALIVARIUS 11/12/2023 1247   REPTSTATUS 11/14/2023 FINAL 11/12/2023 1247    Cardiac Enzymes: No results for input(s): CKTOTAL, CKMB, CKMBINDEX, TROPONINI in the last 168 hours. CBG: No results for input(s): GLUCAP in the last 168 hours. Iron  Studies: No results for input(s): IRON , TIBC, TRANSFERRIN, FERRITIN in the last 72 hours. Lab Results  Component Value Date   INR 1.1 02/09/2024   INR 1.1 01/19/2024   INR 1.3 (H) 11/12/2023   Studies/Results: No results found.  Medications:   (feeding supplement) PROSource Plus  30 mL Oral BID BM   amoxicillin -clavulanate  1 tablet Oral QHS   aspirin  EC  81 mg Oral Daily   atorvastatin   20 mg Oral Daily   B-complex with vitamin C  1 tablet Oral Daily   carvedilol   3.125 mg Oral QHS   Chlorhexidine  Gluconate Cloth  6 each Topical Q0600   cholecalciferol   1,000 Units Oral Daily   clopidogrel   75 mg Oral Daily   darbepoetin (ARANESP ) injection - DIALYSIS  100 mcg Subcutaneous Weekly   doxycycline   100 mg Oral Q12H   gabapentin   200 mg Oral QHS   heparin  injection (subcutaneous)  5,000 Units Subcutaneous Q12H   isosorbide  mononitrate  30 mg Oral Daily   l-methylfolate-B6-B12  1 tablet Oral Daily  latanoprost   1 drop Both Eyes QHS   levothyroxine   50 mcg Oral Daily   nutrition supplement (JUVEN)  1 packet Oral BID BM   sevelamer  carbonate  800 mg Oral TID WC    Dialysis Orders: TTS [facility] 4hr, 350/800, EDW 81kg, 2K/2.5Ca, L AVF, no heparin  - no VDRA, ESA ordered but not yet given  Assessment/Plan: 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 Per VVS. Severe aortic stenosis s/p TAVR 01/20/24: Zio monitor in place. ESRD: HD TTS. Next HD 9/13. Vascular Access: S/p AVF infiltration and difficulty cannulating at his outpatient HD center. F'gram completed 9/8 - normal/patent AVF. HTN/volume: BP look better after lowering Imdur . Continue low dose BB. Well below EDW, lower on  discharge. Anemia of ESRD: Hgb 8.3, s/p Aranesp  on 9/3 - monitor. Raised dose 9/11. Secondary HPTH: CorrCa ok and phos mildly elevated- continue sevelamer  as binder. Nutrition: Alb low, continue supps. Hx cholecystitis with perc chole drain still in place: Erythema/purulence around drain, on PO abx. S/p CT without abscess, and now s/p tube exchange. T2DM Dispo: In CIR.  Charmaine Piety, NP [facility] 02/13/2024,10:11 AM  LOS: 8 days

## 2024-02-13 NOTE — Progress Notes (Signed)
 Occupational Therapy Weekly Progress Note  Patient Details  Name: Phillip [name] MRN: [id number] Date of Birth: 26-Nov-1955  Beginning of progress report period: February 06, 2024 End of progress report period: February 12, 2024  Patient has met 2 of 3 short term goals.  Pt current performs UB bathing with Mod-, UB dressing with touch A to manage R chole drain. LB bathing Mod-I with weight shifting to perform peri-care, LB dressing with Min-Mod A at bed and WC level. Shrinker management A varies d/t increased fear and pain with don/doffing. Pt is overall progressing with OT goals and motivated to participate.  Patient continues to demonstrate the following deficits: muscle weakness, decreased cardiorespiratoy endurance, and decreased standing balance and decreased postural control. ADL deficits include LB dressing, toileting, residual limb management, and activity tolerance, and therefore will continue to benefit from skilled OT intervention to enhance overall performance with BADL.  Patient progressing toward long term goals. OT goals set to supervision. Continue plan of care to address deficits above, family education scheduled 9/17 to assist with discharge transition.   OT Short Term Goals Week 1:  OT Short Term Goal 1 (Week 1): Pt will complete LB bathing with Mod A OT Short Term Goal 1 - Progress (Week 1): Met OT Short Term Goal 2 (Week 1): Pt will complete toilet transfer with Min A OT Short Term Goal 2 - Progress (Week 1): Progressing toward goal OT Short Term Goal 3 (Week 1): Pt will complete LB dressing with Mod A OT Short Term Goal 3 - Progress (Week 1): Met Week 2:  OT Short Term Goal 1 (Week 2): LTG=STG d/t ELOS  Therapy Documentation Precautions:  Precautions Precautions: Fall Recall of Precautions/Restrictions: Intact Precaution/Restrictions Comments: R chole drain, L BKA Required Braces or Orthoses: Other Brace Other Brace: limb guard Restrictions Weight Bearing  Restrictions Per Provider Order: No LLE Weight Bearing Per Provider Order: Partial weight bearing   [name], MS, OTR/L 02/13/2024, 7:51 AM

## 2024-02-13 NOTE — Progress Notes (Signed)
 Physical Therapy Session Note  Patient Details  Name: Phillip [name] MRN: [id number] Date of Birth: 1955-07-09  Today's Date: 02/13/2024 PT Individual Time: 1346-1456 PT Individual Time Calculation (min): 70 min   Short Term Goals: Week 1:  PT Short Term Goal 1 (Week 1): Pt will perform basic transfers with min A PT Short Term Goal 2 (Week 1): Pt will be able to perform sit <> stands with min A PT Short Term Goal 3 (Week 1): Pt will be able to initiate gait  Skilled Therapeutic Interventions/Progress Updates:   Received pt sitting in [employer] with wife at bedside - pt reported enjoying amputee group this morning. Pt agreeable to PT treatment and reported pain 7/10 in L residual limb (premedicated). Session with emphasis on functional mobility/transfers, generalized strengthening and endurance, and dynamic standing balance/coordination. NT arrived to check vitals, then transported pt to dayroom dependently due to fatigue.   Educated pt on desensitization (including rubbing, massage, and tapping) for pain relief. Worked on Ship broker therapy with emphasis on pain management. Instructed pt to maintain eye contact with mirror during entire duration of treatment (pt required mod cues) and to try to convince yourself that the mirror image is your leg. Instructed pt in the following: -hip flexion 2x10 -knee extension 2x10 -ankle circles x20 clockwise and counterclockwise -rubbing with hands x2 mins -rubbing with washcloth to add sensory stimuli x2 mins  Pt then performed the following seated exercises with emphasis on LE strength/ROM: -L knee extension 2x12 -L hip flexion 2x12  -hip adduction ball squeezes 2x10 with 5 second hold -hip abduction with red TB 2x15 Returned to room and located drop arm recliner. Pt transferred WC<>drop arm recliner via lateral scoot with CGA/close supervision and cues for technique. Concluded session with pt sitting in recliner with all needs within reach and wife at  bedside.   Therapy Documentation Precautions:  Precautions Precautions: Fall Recall of Precautions/Restrictions: Intact Precaution/Restrictions Comments: R chole drain, L BKA Required Braces or Orthoses: Other Brace Other Brace: limb guard Restrictions Weight Bearing Restrictions Per Provider Order: No LLE Weight Bearing Per Provider Order: Partial weight bearing  Therapy/Group: Individual Therapy [name] Therisa Stains PT, DPT 02/13/2024, 6:53 AM

## 2024-02-13 NOTE — Progress Notes (Signed)
 PROGRESS NOTE   Subjective/Complaints: Discussed hypotension with nephrology NP, imdur  decreased Patient feels tired, BP improved No other complaints  ROS: Patient denies fever, rash, sore throat, blurred vision, dizziness, nausea, vomiting, diarrhea, cough, shortness of breath or chest pain, back/neck pain, headache, or mood change. +fatigue  Objective:   No results found.   Recent Labs    02/11/24 1853  WBC 4.9  HGB 8.3*  HCT 26.8*  PLT 74*   Recent Labs    02/12/24 1217  NA 131*  K 4.6  CL 93*  CO2 26  GLUCOSE 131*  BUN 66*  CREATININE 7.40*  CALCIUM  8.5*    Intake/Output Summary (Last 24 hours) at 02/13/2024 1026 Last data filed at 02/13/2024 0820 Gross per 24 hour  Intake 458 ml  Output 275 ml  Net 183 ml        Physical Exam: Vital Signs Blood pressure 136/61, pulse 90, temperature 97.8 F (36.6 C), temperature source Oral, resp. rate 18, height 6' (1.829 m), weight 69.9 kg, SpO2 100%.  Constitutional: No distress . Vital signs reviewed. HEENT: NCAT, EOMI, oral membranes moist Neck: supple Cardiovascular: RRR without murmur. No JVD    Respiratory/Chest: CTA Bilaterally without wheezes or rales. Normal effort    GI/Abdomen: BS +, non-tender, non-distended, perc chole tube Ext: no clubbing, cyanosis, or edema Psych: pleasant and cooperative  Skin: Left aVF, left BK with s/s drainage, gauze, ACE wrap in place Neuro: Alert and oriented to person, place, reasons, month/year. More attentive. Better insight and awareness. Speech clearer. Cranial nerves II through XII grossly intact. Bilateral upper extremity strength 5 out of 5. Right lower extremity strength 4 out of 5 proximal, 5 out of 5 distal. Left lower extremity at least 3-5, stable 9/12 Sensation intact light touch in all extremities  Assessment/Plan: 1. Functional deficits which require 3+ hours per day of interdisciplinary therapy in a  comprehensive inpatient rehab setting. Physiatrist is providing close team supervision and 24 hour management of active medical problems listed below. Physiatrist and rehab team continue to assess barriers to discharge/monitor patient progress toward functional and medical goals  Care Tool:  Bathing    Body parts bathed by patient: Right arm, Left arm, Chest, Abdomen, Front perineal area, Buttocks, Right upper leg, Left upper leg, Face   Body parts bathed by helper: Right lower leg Body parts n/a: Left lower leg   Bathing assist Assist Level: Minimal Assistance - Patient > 75%     Upper Body Dressing/Undressing Upper body dressing   What is the patient wearing?: Pull over shirt    Upper body assist Assist Level: Set up assist    Lower Body Dressing/Undressing Lower body dressing      What is the patient wearing?: Underwear/pull up, Pants, Ace wrap/stump shrinker     Lower body assist Assist for lower body dressing: Moderate Assistance - Patient 50 - 74%     Toileting Toileting    Toileting assist Assist for toileting: Maximal Assistance - Patient 25 - 49%     Transfers Chair/bed transfer  Transfers assist     Chair/bed transfer assist level: Minimal Assistance - Patient > 75%     Locomotion  Ambulation   Ambulation assist   Ambulation activity did not occur: Safety/medical concerns          Walk 10 feet activity   Assist  Walk 10 feet activity did not occur: Safety/medical concerns        Walk 50 feet activity   Assist Walk 50 feet with 2 turns activity did not occur: Safety/medical concerns         Walk 150 feet activity   Assist Walk 150 feet activity did not occur: Safety/medical concerns         Walk 10 feet on uneven surface  activity   Assist Walk 10 feet on uneven surfaces activity did not occur: Safety/medical concerns         Wheelchair     Assist Is the patient using a wheelchair?: Yes Type of Wheelchair:  Manual    Wheelchair assist level: Supervision/Verbal cueing Max wheelchair distance: 150'    Wheelchair 50 feet with 2 turns activity    Assist        Assist Level: Supervision/Verbal cueing   Wheelchair 150 feet activity     Assist      Assist Level: Supervision/Verbal cueing   Blood pressure 136/61, pulse 90, temperature 97.8 F (36.6 C), temperature source Oral, resp. rate 18, height 6' (1.829 m), weight 69.9 kg, SpO2 100%.  Medical Problem List and Plan: 1. Functional deficits secondary to left BKA 01/30/2024 after failed TMA with history of PAD status post PCI to left tibia/fibula angioplasty              -patient may  shower             -ELOS/Goals: 7 days with mod I goals for mobility and self-care  Chart and therapy notes reviewed, continue CIR, discussed patient's progress with therapy Grounds pass ordered Vitamin D3/Metanx/Vitamin B/C complex ordered F/u with me or Fidela in clinic within 1 month Would benefit from home health aide upon discharge   Should have cardiology follow-up within 1 week because cardiology medications have been changed due to hypotension  2.  Anemia: Hgb reviewed and is stable at 8.3, continue SQ heparin  q12H, stool occult ordered             -antiplatelet therapy: Aspirin  81 mg daily, Plavix  75 mg daily  3. Pain Management: decrease oxycodone  to 5mg  q6h given hypotension/fatigue Continue gabapentin   4. Insomnia: continue prn melatonin  5. Neuropsych/cognition: This patient is capable of making decisions on his own behalf.  -9/7pt appears clearer today. See above  6. Chole site irritation/yellow drainage: consulted IR  -local care to stump. Can use ACE for now for compression if he likes  7. Fluids/Electrolytes/Nutrition: Routine in and out with follow-up chemistries  8.  Aortic stenosis.  Status post TAVR 01/20/2024  9.  End-stage renal disease.  Continue hemodialysis as per renal services             -HD at end of day to  allow participation in therapy  - HD T TH S  10.  Hyperlipidemia.  Lipitor  11.  Hypotension: coreg  decreased to HS, imdur  decreased by nephrology to 30mg  daily.  Monitor with increased mobility    02/13/2024    5:45 AM 02/13/2024    3:06 AM 02/13/2024    1:56 AM  Vitals with BMI  Weight 154 lbs 2 oz    BMI 20.9    Systolic  136 119  Diastolic  61 67  Pulse  90 92  12.  Hypothyroidism.  Continue Synthroid   13.  Acute on chronic anemia: see #2  14.  CAD with history of CABG.  No chest pain or shortness of breath.  Continue Imdur   15.  BPH: d/c flomax  given hypotension, resolved  16.  Chronic diastolic congestive heart failure.  Monitor for any signs of fluid overload.  Follow-up per cardiology services.             -daily weights             -volume mgt with HD, weight reviewed and is decreased Filed Weights   02/12/24 0511 02/12/24 1515 02/13/24 0545  Weight: 69.9 kg 71.1 kg 69.9 kg    17.  Recent acute cholecystitis June 2025.  Currently maintained on Augmentin  and linezolid  for 3 to 4 weeks and will confirm duration of antibiotic.  Radiology following  for percutaneous cholecystostomy tube for recent cholecystitis. Awaiting plan for continued suction drainage versus change to gravity bag. IR consulted since chole tube site appears irritated, reviewed their note- recommending to maintain drain     LOS: 8 days A FACE TO FACE EVALUATION WAS PERFORMED  Phillip [name] 02/13/2024, 10:26 AM

## 2024-02-14 LAB — CBC
HCT: 22.1 % — ABNORMAL LOW (ref 39.0–52.0)
Hemoglobin: 6.8 g/dL — CL (ref 13.0–17.0)
MCH: 29.8 pg (ref 26.0–34.0)
MCHC: 30.8 g/dL (ref 30.0–36.0)
MCV: 96.9 fL (ref 80.0–100.0)
Platelets: 38 K/uL — ABNORMAL LOW (ref 150–400)
RBC: 2.28 MIL/uL — ABNORMAL LOW (ref 4.22–5.81)
RDW: 14.6 % (ref 11.5–15.5)
WBC: 4.7 K/uL (ref 4.0–10.5)
nRBC: 0 % (ref 0.0–0.2)

## 2024-02-14 LAB — RENAL FUNCTION PANEL
Albumin: 2.1 g/dL — ABNORMAL LOW (ref 3.5–5.0)
Anion gap: 10 (ref 5–15)
BUN: 57 mg/dL — ABNORMAL HIGH (ref 8–23)
CO2: 27 mmol/L (ref 22–32)
Calcium: 8.3 mg/dL — ABNORMAL LOW (ref 8.9–10.3)
Chloride: 92 mmol/L — ABNORMAL LOW (ref 98–111)
Creatinine, Ser: 5.56 mg/dL — ABNORMAL HIGH (ref 0.61–1.24)
GFR, Estimated: 10 mL/min — ABNORMAL LOW (ref >60)
Glucose, Bld: 113 mg/dL — ABNORMAL HIGH (ref 70–99)
Phosphorus: 3.1 mg/dL (ref 2.5–4.6)
Potassium: 4.4 mmol/L (ref 3.5–5.1)
Sodium: 129 mmol/L — ABNORMAL LOW (ref 135–145)

## 2024-02-14 LAB — PREPARE RBC (CROSSMATCH)

## 2024-02-14 LAB — HEPATITIS B SURFACE ANTIGEN: Hepatitis B Surface Ag: NONREACTIVE

## 2024-02-14 MED ORDER — SODIUM CHLORIDE 0.9% IV SOLUTION: Freq: Once | INTRAVENOUS | Status: DC

## 2024-02-14 MED ORDER — ACETAMINOPHEN 325 MG PO TABS
ORAL_TABLET | ORAL | Status: AC
  Filled 2024-02-14: qty 2

## 2024-02-14 NOTE — Progress Notes (Signed)
 PROGRESS NOTE   Subjective/Complaints:  No events overnight. Vitals stable     02/14/2024    4:56 AM 02/14/2024    3:42 AM 02/13/2024    9:05 PM  Vitals with BMI  Weight 153 lbs 14 oz    BMI 20.87    Systolic  123 111  Diastolic  62 72  Pulse  80 78    No results for input(s): GLUCAP in the last 72 hours.   P.o. intakes appropriate  Continent of bladder  Last BM this morning per patient.  ROS: Patient denies fever, rash, sore throat, blurred vision, dizziness, nausea, vomiting, diarrhea, cough, shortness of breath or chest pain, back/neck pain, headache, or mood change. +fatigue  Objective:   No results found.   Recent Labs    02/11/24 1853  WBC 4.9  HGB 8.3*  HCT 26.8*  PLT 74*   Recent Labs    02/12/24 1217  NA 131*  K 4.6  CL 93*  CO2 26  GLUCOSE 131*  BUN 66*  CREATININE 7.40*  CALCIUM  8.5*    Intake/Output Summary (Last 24 hours) at 02/14/2024 1101 Last data filed at 02/14/2024 0900 Gross per 24 hour  Intake 580 ml  Output --  Net 580 ml        Physical Exam: Vital Signs Blood pressure 123/62, pulse 80, temperature 98.4 F (36.9 C), temperature source Oral, resp. rate 16, height 6' (1.829 m), weight 69.8 kg, SpO2 100%.  Constitutional: No distress . Vital signs reviewed.  Laying in dialysis bed. HEENT: NCAT, EOMI, oral membranes moist Neck: supple Cardiovascular: RRR without murmur. No JVD    Respiratory/Chest: CTA Bilaterally without wheezes or rales. Normal effort    GI/Abdomen: BS +, non-tender, non-distended, perc chole tube Ext: no clubbing, cyanosis, or edema Psych: pleasant and cooperative  Skin: Left aVF, left BK with s/s drainage, gauze, ACE wrap in place Neuro: Alert and oriented to person, place, reasons, month/year. More attentive. Better insight and awareness. Speech clearer. Cranial nerves II through XII grossly intact. Bilateral upper extremity strength 5 out of  5. Right lower extremity strength 4 out of 5 proximal, 5 out of 5 distal. Left lower extremity at least 3-5, stable 9/12 Sensation intact light touch in all extremities  Physical exam unchanged from the above on reexamination 02/14/24    Assessment/Plan: 1. Functional deficits which require 3+ hours per day of interdisciplinary therapy in a comprehensive inpatient rehab setting. Physiatrist is providing close team supervision and 24 hour management of active medical problems listed below. Physiatrist and rehab team continue to assess barriers to discharge/monitor patient progress toward functional and medical goals  Care Tool:  Bathing    Body parts bathed by patient: Right arm, Left arm, Chest, Abdomen, Front perineal area, Buttocks, Right upper leg, Left upper leg, Face   Body parts bathed by helper: Right lower leg Body parts n/a: Left lower leg   Bathing assist Assist Level: Minimal Assistance - Patient > 75%     Upper Body Dressing/Undressing Upper body dressing   What is the patient wearing?: Pull over shirt    Upper body assist Assist Level: Set up assist    Lower  Body Dressing/Undressing Lower body dressing      What is the patient wearing?: Underwear/pull up, Pants, Ace wrap/stump shrinker     Lower body assist Assist for lower body dressing: Moderate Assistance - Patient 50 - 74%     Toileting Toileting    Toileting assist Assist for toileting: Maximal Assistance - Patient 25 - 49%     Transfers Chair/bed transfer  Transfers assist     Chair/bed transfer assist level: Minimal Assistance - Patient > 75%     Locomotion Ambulation   Ambulation assist   Ambulation activity did not occur: Safety/medical concerns          Walk 10 feet activity   Assist  Walk 10 feet activity did not occur: Safety/medical concerns        Walk 50 feet activity   Assist Walk 50 feet with 2 turns activity did not occur: Safety/medical concerns          Walk 150 feet activity   Assist Walk 150 feet activity did not occur: Safety/medical concerns         Walk 10 feet on uneven surface  activity   Assist Walk 10 feet on uneven surfaces activity did not occur: Safety/medical concerns         Wheelchair     Assist Is the patient using a wheelchair?: Yes Type of Wheelchair: Manual    Wheelchair assist level: Supervision/Verbal cueing Max wheelchair distance: 150'    Wheelchair 50 feet with 2 turns activity    Assist        Assist Level: Supervision/Verbal cueing   Wheelchair 150 feet activity     Assist      Assist Level: Supervision/Verbal cueing   Blood pressure 123/62, pulse 80, temperature 98.4 F (36.9 C), temperature source Oral, resp. rate 16, height 6' (1.829 m), weight 69.8 kg, SpO2 100%.  Medical Problem List and Plan: 1. Functional deficits secondary to left BKA 01/30/2024 after failed TMA with history of PAD status post PCI to left tibia/fibula angioplasty              -patient may  shower             -ELOS/Goals: 7 days with mod I goals for mobility and self-care  Chart and therapy notes reviewed, continue CIR, discussed patient's progress with therapy Grounds pass ordered Vitamin D3/Metanx/Vitamin B/C complex ordered F/u with me or Fidela in clinic within 1 month Would benefit from home health aide upon discharge   Should have cardiology follow-up within 1 week because cardiology medications have been changed due to hypotension  2.  Anemia: Hgb reviewed and is stable at 8.3, continue SQ heparin  q12H, stool occult ordered             -antiplatelet therapy: Aspirin  81 mg daily, Plavix  75 mg daily   - 9-13: Hemoglobin 6.8 with dialysis today.  Getting transfusion, repeat H&H in AM.  Had small stool this morning, no noted blood.  3. Pain Management: decrease oxycodone  to 5mg  q6h given hypotension/fatigue Continue gabapentin   4. Insomnia: continue prn melatonin  5.  Neuropsych/cognition: This patient is capable of making decisions on his own behalf.  -9/7pt appears clearer today. See above  6. Chole site irritation/yellow drainage: consulted IR  -local care to stump. Can use ACE for now for compression if he likes  7. Fluids/Electrolytes/Nutrition: Routine in and out with follow-up chemistries  8.  Aortic stenosis.  Status post TAVR 01/20/2024  9.  End-stage renal  disease.  Continue hemodialysis as per renal services             -HD at end of day to allow participation in therapy  - HD T TH S  10.  Hyperlipidemia.  Lipitor  11.  Hypotension: coreg  decreased to HS, imdur  decreased by nephrology to 30mg  daily.  Monitor with increased mobility  - 9-13: Blood pressure appears improved with decreased Imdur .    02/14/2024    4:56 AM 02/14/2024    3:42 AM 02/13/2024    9:05 PM  Vitals with BMI  Weight 153 lbs 14 oz    BMI 20.87    Systolic  123 111  Diastolic  62 72  Pulse  80 78   12.  Hypothyroidism.  Continue Synthroid   13.  Acute on chronic anemia: see #2  14.  CAD with history of CABG.  No chest pain or shortness of breath.  Continue Imdur   15.  BPH: d/c flomax  given hypotension, resolved  16.  Chronic diastolic congestive heart failure.  Monitor for any signs of fluid overload.  Follow-up per cardiology services.             -daily weights             -volume mgt with HD, weight reviewed and is decreased Filed Weights   02/12/24 1515 02/13/24 0545 02/14/24 0456  Weight: 71.1 kg 69.9 kg 69.8 kg    17.  Recent acute cholecystitis June 2025.  Currently maintained on Augmentin  and linezolid  for 3 to 4 weeks and will confirm duration of antibiotic.  Radiology following  for percutaneous cholecystostomy tube for recent cholecystitis. Awaiting plan for continued suction drainage versus change to gravity bag. IR consulted since chole tube site appears irritated, reviewed their note- recommending to maintain drain  18.  Constipation.  No bowel  movements recorded.  Will check with patient today--states last this a.m.    LOS: 9 days A FACE TO FACE EVALUATION WAS PERFORMED  Phillip [name] Likes 02/14/2024, 11:01 AM

## 2024-02-14 NOTE — Plan of Care (Signed)
  Problem: Consults Goal: RH LIMB LOSS PATIENT EDUCATION Description: Description: See Patient Education module for eduction specifics. Outcome: Progressing   Problem: RH BOWEL ELIMINATION Goal: RH STG MANAGE BOWEL WITH ASSISTANCE Description: STG Manage Bowel with mod I Assistance. Outcome: Progressing   Problem: RH SKIN INTEGRITY Goal: RH STG SKIN FREE OF INFECTION/BREAKDOWN Outcome: Progressing   Problem: RH SAFETY Goal: RH STG ADHERE TO SAFETY PRECAUTIONS W/ASSISTANCE/DEVICE Description: STG Adhere to Safety Precautions With mod I Assistance/Device. Outcome: Progressing   Problem: RH PAIN MANAGEMENT Goal: RH STG PAIN MANAGED AT OR BELOW PT'S PAIN GOAL Description: <4 w/ prns Outcome: Progressing   Problem: RH KNOWLEDGE DEFICIT LIMB LOSS Goal: RH STG INCREASE KNOWLEDGE OF SELF CARE AFTER LIMB LOSS Description: Manage increase knowledge of self carte after limb loss with mod I assistance from wife using educational materials provided Outcome: Progressing   Problem: Education: Goal: Knowledge of General Education information will improve Description: Including pain rating scale, medication(s)/side effects and non-pharmacologic comfort measures Outcome: Progressing   Problem: Health Behavior/Discharge Planning: Goal: Ability to manage health-related needs will improve Outcome: Progressing   Problem: Clinical Measurements: Goal: Ability to maintain clinical measurements within normal limits will improve Outcome: Progressing Goal: Will remain free from infection Outcome: Progressing Goal: Diagnostic test results will improve Outcome: Progressing Goal: Respiratory complications will improve Outcome: Progressing Goal: Cardiovascular complication will be avoided Outcome: Progressing   Problem: Activity: Goal: Risk for activity intolerance will decrease Outcome: Progressing   Problem: Nutrition: Goal: Adequate nutrition will be maintained Outcome: Progressing    Problem: Coping: Goal: Level of anxiety will decrease Outcome: Progressing   Problem: Elimination: Goal: Will not experience complications related to bowel motility Outcome: Progressing Goal: Will not experience complications related to urinary retention Outcome: Progressing   Problem: Pain Managment: Goal: General experience of comfort will improve and/or be controlled Outcome: Progressing   Problem: Safety: Goal: Ability to remain free from injury will improve Outcome: Progressing   Problem: Skin Integrity: Goal: Risk for impaired skin integrity will decrease Outcome: Progressing

## 2024-02-14 NOTE — Progress Notes (Signed)
 Dr. Emeline informed of critical low Hemoglobin of 6.8.  Dr. Emeline ordered 1 unit blood and type and screen.  Type and screen to blood bank from dialysis.

## 2024-02-14 NOTE — Progress Notes (Signed)
 [facility] Progress Note   Subjective:   Patient seen and examined at bedside.  Requires expert cannulator with HD.  Denies CP, SOB, abdominal pain and n/v/d.  Tolerated breakfast this morning.  Requesting to not have dialysis overnight.   Objective Vitals:   02/13/24 1924 02/13/24 2105 02/14/24 0342 02/14/24 0456  BP: (!) 102/44 111/72 123/62   Pulse: 89 78 80   Resp: 16  16   Temp: 99.1 F (37.3 C)  98.4 F (36.9 C)   TempSrc: Oral  Oral   SpO2: 100%  100%   Weight:    69.8 kg  Height:       Physical Exam General:alert male in NAD Heart:RRR, no mrg Lungs:CTAB, nml WOB on RA Abdomen:soft, NTND Extremities:L BKA, no LE edema  Dialysis Access: LU AVF +b/t   Filed Weights   02/12/24 1515 02/13/24 0545 02/14/24 0456  Weight: 71.1 kg 69.9 kg 69.8 kg    Intake/Output Summary (Last 24 hours) at 02/14/2024 0817 Last data filed at 02/14/2024 0720 Gross per 24 hour  Intake 580 ml  Output --  Net 580 ml    Additional Objective Labs: Basic Metabolic Panel: Recent Labs  Lab 02/09/24 0515 02/10/24 0444 02/12/24 1217  NA 129* 129* 131*  K 5.2* 5.2* 4.6  CL 91* 89* 93*  CO2 25 23 26   GLUCOSE 110* 91 131*  BUN 69* 95* 66*  CREATININE 8.10* 9.37* 7.40*  CALCIUM  8.2* 8.0* 8.5*  PHOS 5.2* 5.7* 4.1   Liver Function Tests: Recent Labs  Lab 02/09/24 0515 02/10/24 0444 02/12/24 1217  ALBUMIN  2.2* 2.2* 2.3*   CBC: Recent Labs  Lab 02/08/24 0500 02/09/24 0515 02/10/24 0444 02/11/24 1853  WBC 5.9 5.2 5.3 4.9  NEUTROABS 4.1 3.7 3.6 3.4  HGB 8.8* 8.7* 8.3* 8.3*  HCT 28.6* 28.1* 25.3* 26.8*  MCV 96.6 96.9 95.5 96.4  PLT 113* 103* 83* 74*   Blood Culture    Component Value Date/Time   SDES BILE 11/12/2023 1247   SPECREQUEST NONE 11/12/2023 1247   CULT MODERATE STREPTOCOCCUS SALIVARIUS 11/12/2023 1247   REPTSTATUS 11/14/2023 FINAL 11/12/2023 1247    Medications:   (feeding supplement) PROSource Plus  30 mL Oral BID BM    amoxicillin -clavulanate  1 tablet Oral QHS   aspirin  EC  81 mg Oral Daily   atorvastatin   20 mg Oral Daily   B-complex with vitamin C  1 tablet Oral Daily   carvedilol   3.125 mg Oral QHS   Chlorhexidine  Gluconate Cloth  6 each Topical Q0600   cholecalciferol   1,000 Units Oral Daily   clopidogrel   75 mg Oral Daily   darbepoetin (ARANESP ) injection - DIALYSIS  100 mcg Subcutaneous Weekly   doxycycline   100 mg Oral Q12H   gabapentin   200 mg Oral QHS   heparin  injection (subcutaneous)  5,000 Units Subcutaneous Q12H   isosorbide  mononitrate  30 mg Oral Daily   l-methylfolate-B6-B12  1 tablet Oral Daily   latanoprost   1 drop Both Eyes QHS   levothyroxine   50 mcg Oral Daily   nutrition supplement (JUVEN)  1 packet Oral BID BM   sevelamer  carbonate  800 mg Oral TID WC    Dialysis Orders: TTS [facility] 4hr, 350/800, EDW 81kg, 2K/2.5Ca, L AVF, no heparin  - no VDRA, ESA ordered but not yet given   Assessment/Plan: 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 Per [facility]. Severe aortic stenosis s/p TAVR 01/20/24: Zio  monitor in place. ESRD: HD TTS. Next HD 9/13. Vascular Access: S/p AVF infiltration and difficulty cannulating at his outpatient HD center. F'gram completed 9/8 - normal/patent AVF.  Needs expert cannulator.  HTN/volume: BP in goal. Continue carvedilol  3.125mg  BID and Imdur  30mg  every day. S/p amputation, will need lower edw on discharge.  Anemia of ESRD: Hgb 8.3, s/p Aranesp  on 9/3 - monitor. Raised dose 9/11. Secondary HPTH: CorrCa ok and phos ok- continue sevelamer  as binder. Nutrition: Alb low, continue supps. Hx cholecystitis with perc chole drain still in place: Erythema/purulence around drain, on PO abx. S/p CT without abscess, and now s/p tube exchange. On amoxicillin . T2DM Dispo: In CIR.  Manuelita Labella, PA-C [facility] 02/14/2024,8:17 AM  LOS: 9 days

## 2024-02-14 NOTE — Progress Notes (Signed)
 Received patient in bed to unit.  Alert and oriented.  Informed consent signed and in chart.   TX duration:2 hours and 47 minutes.  Patient was very fidgety and because patient kept bending his arm he set.  AMA paper signed by patient to come off early  Patient tolerated well.  Transported back to the room  Alert, without acute distress.  Hand-off given to patient's nurse.   Access used: Left upper arm fistula Access issues: patient kept bending arm  Total UF removed: Medication(s) given: 1 Unit of blood, Tylenol    02/14/24 1819  Vitals  Temp 98.3 F (36.8 C)  Temp Source Oral  BP 117/65  Pulse Rate 79  ECG Heart Rate 77  Resp 17  Oxygen Therapy  SpO2 100 %  O2 Device Room Air  During Treatment Monitoring  Duration of HD Treatment -hour(s) 2.73 hour(s) (2 hours and 46 minutes)  HD Safety Checks Performed Yes  Intra-Hemodialysis Comments See progress note (Patient stopped session, signed AMA paperwork.)  Post Treatment  Dialyzer Clearance Lightly streaked  Liters Processed 54.6  Fluid Removed (mL) 300 mL  Tolerated HD Treatment No (Comment)  Post-Hemodialysis Comments see note  AVG/AVF Arterial Site Held (minutes) 7 minutes  AVG/AVF Venous Site Held (minutes) 7 minutes  Fistula / Graft Left Upper arm  Placement Date/Time: 01/21/24 0000   Placed prior to admission: Yes  Orientation: Left  Access Location: Upper arm  Site Condition No complications  Fistula / Graft Assessment Present;Thrill;Bruit  Status Deaccessed;Patent  Drainage Description None     Camellia Brasil LPN [facility]

## 2024-02-14 NOTE — Progress Notes (Signed)
 Physical Therapy Weekly Progress Note  Patient Details  Name: Phillip [name] MRN: [id number] Date of Birth: Dec 12, 1955  Beginning of progress report period: February 06, 2024 End of progress report period: February 14, 2024  Today's Date: 02/14/2024 PT Individual Time: 0720-0828 PT Individual Time Calculation (min): 68 min   Patient has met 1 of 3 short term goals. Pt demonstrates slow progress towards long term goals. Pt is currently able to perform bed mobility with supervision using bed features, sit<>stands from elevated surfaces with RW and mod A with cues for technique, lateral scoot transfers with CGA/min A, and perform WC mobility 173ft using BUE and supervision. Pt remains limited by pain, fear of falling, decreased balance/coordination, and global weakness/deconditioning.   Patient continues to demonstrate the following deficits muscle weakness, decreased cardiorespiratoy endurance, decreased memory, and decreased standing balance, decreased postural control, decreased balance strategies, and difficulty maintaining precautions and therefore will continue to benefit from skilled PT intervention to increase functional independence with mobility.  Patient progressing toward long term goals..  Continue plan of care.  PT Short Term Goals Week 1:  PT Short Term Goal 1 (Week 1): Pt will perform basic transfers with min A PT Short Term Goal 1 - Progress (Week 1): Met PT Short Term Goal 2 (Week 1): Pt will be able to perform sit <> stands with min A PT Short Term Goal 2 - Progress (Week 1): Progressing toward goal PT Short Term Goal 3 (Week 1): Pt will be able to initiate gait PT Short Term Goal 3 - Progress (Week 1): Progressing toward goal Week 2:  PT Short Term Goal 1 (Week 2): STG=LTG due to LOS  Skilled Therapeutic Interventions/Progress Updates:  Ambulation/gait training;Balance/vestibular training;Community reintegration;Discharge planning;Disease  management/prevention;DME/adaptive equipment instruction;Functional mobility training;Neuromuscular re-education;Pain management;Patient/family education;Psychosocial support;Skin care/wound management;Splinting/orthotics;Stair training;Therapeutic Activities;Therapeutic Exercise;UE/LE Strength taining/ROM;UE/LE Coordination activities;Wheelchair propulsion/positioning   Today's Interventions: Received pt sitting EOB with RN administering medication. Pt required encouragement to participate in session (due to fatigue, pain, and being cold) and reported pain 7/10 in L residual limb. Session with emphasis on functional mobility/transfers, generalized strengthening and endurance, dynamic standing balance/coordination, and limb loss education. Pt removed dirty 3XL shrinker independently and noted incision to be healing well with very minimal drainage. Donned 2XL shrinker with total A, then R shoe with max A. Pt donned limb guard with supervision and increased time. Stood from elevated EOB with RW and mod A fading to min A x 4 trials - cues for anterior weight shifting , upright posture/gaze, hand placement on RW, and R knee extension.   Pt performed lateral scoot bed<>WC to R with min A and cues for buttocks clearance and sat in WC at sink and brushed teeth with setup assist. With encouragement, pt performed WC mobility 177ft using BUE and supervision to dayroom with emphasis on UE strength and coordination. Pt performed seated RLE strengthening on Kinetron at 20 cm/sec for 3 minutes x 2 trials with therapist providing manual counter resistance with emphasis on glute/quad strength.   Discussed pt's concerns regarding going to/from dialysis - discussed that pt will be at a WC level (primarily lateral scoot) and will transfer in/out of WC/car going to dialysis with assist from his wife (similar to what he was doing prior but now just from [facility] level). Returned to room and transferred into recliner via lateral scoot  to L with CGA. Discussed home setup - pt reports his brother in law is currently working on getting ramp installed. Pt reports not having  WC and will need 18x18 WC and slideboard. Also discussed purchasing bed assist rail for ease with bed mobility. Concluded session with pt sitting in recliner with all needs within reach. Provided pt with additional blankets for comfort.   Therapy Documentation Precautions:  Precautions Precautions: Fall Recall of Precautions/Restrictions: Intact Precaution/Restrictions Comments: R chole drain, L BKA Required Braces or Orthoses: Other Brace Other Brace: limb guard Restrictions Weight Bearing Restrictions Per Provider Order: No LLE Weight Bearing Per Provider Order: Partial weight bearing   Therapy/Group: Individual Therapy [name] Therisa Stains PT, DPT 02/14/2024, 6:50 AM

## 2024-02-15 ENCOUNTER — Inpatient Hospital Stay (HOSPITAL_COMMUNITY)

## 2024-02-15 LAB — TYPE AND SCREEN
ABO/RH(D): O POS
Antibody Screen: NEGATIVE
Unit division: 0

## 2024-02-15 LAB — GLUCOSE, CAPILLARY
Glucose-Capillary: 97 mg/dL (ref 70–99)
Glucose-Capillary: 126 mg/dL — ABNORMAL HIGH (ref 70–99)

## 2024-02-15 LAB — BPAM RBC
Blood Product Expiration Date: 202510112359
ISSUE DATE / TIME: 202509131703
Unit Type and Rh: 5100

## 2024-02-15 LAB — HEMOGLOBIN AND HEMATOCRIT, BLOOD
HCT: 28.2 % — ABNORMAL LOW (ref 39.0–52.0)
Hemoglobin: 9.1 g/dL — ABNORMAL LOW (ref 13.0–17.0)

## 2024-02-15 NOTE — Progress Notes (Signed)
 Occupational Therapy Session Note  Patient Details  Name: Phillip [name] MRN: [id number] Date of Birth: 1955/11/25  Today's Date: 02/15/2024 OT Individual Time: 9084-8975 OT Individual Time Calculation (min): 69 min    Short Term Goals: Week 2:  OT Short Term Goal 1 (Week 2): LTG=STG d/t ELOS  Skilled Therapeutic Interventions/Progress Updates:     Pt received sitting up in recliner presenting to be in good spirits receptive to skilled OT session reporting 8/10 pain in L limb- OT offering intermittent rest breaks, repositioning, and therapeutic support to optimize participation in therapy session. Pt requesting to get washed up this AM and complete his morning routine. Focused this session on ADL retraining to increase overall independence and decrease bourdon of care. Pt with shrinker donned on L limb upon OT arrival. Pt donned limb guard with SUP. Pt completed SB transfers this session recliner <> wc x2 trials with assistance required for SB placement and stabilization +min verbal cues for technique. Positioned Pt at sink for sponge bath. UB bathing complete with SUP. He was able to complete lateral leans and offload opposite hip sufficiently enough to cleans buttocks. Increased time and rest breaks required during ADLs d/t decreased activity tolerance and to conserve energy. Pt donned [place] shirt with set-up A and was able to weave R foot and L limb into pants with SUP. He was able to complete 2 partial stands this session during clothing management when doff/donning pants over hips with MOD A required for sit<>stand, MOD A for standing balance, and MAX A for clothing management to allow Pt to focus on balance. He continues to present with fear of falling and is hesitant to come into full standing position. Brushed teeth in seated position with set-up A. Engaged Pt in completing w/c propulsion to/from therapy gym to work on activity tolerance and wc mobility skills- he is able to complete ~86ft x3  trials with rest breaks required between and following trials d/t fatigue. Pt was left resting in recliner with call bell in reach and all needs met.    Therapy Documentation Precautions:  Precautions Precautions: Fall Recall of Precautions/Restrictions: Intact Precaution/Restrictions Comments: R chole drain, L BKA Required Braces or Orthoses: Other Brace Other Brace: limb guard Restrictions Weight Bearing Restrictions Per Provider Order: No LLE Weight Bearing Per Provider Order: Partial weight bearing   Therapy/Group: Individual Therapy  Katheryn SHAUNNA [place]ines 02/15/2024, 7:51 AM

## 2024-02-15 NOTE — Plan of Care (Signed)
  Problem: Consults Goal: RH LIMB LOSS PATIENT EDUCATION Description: Description: See Patient Education module for eduction specifics. Outcome: Progressing   Problem: RH BOWEL ELIMINATION Goal: RH STG MANAGE BOWEL WITH ASSISTANCE Description: STG Manage Bowel with mod I Assistance. Outcome: Progressing   Problem: RH SKIN INTEGRITY Goal: RH STG SKIN FREE OF INFECTION/BREAKDOWN Outcome: Progressing   Problem: RH SAFETY Goal: RH STG ADHERE TO SAFETY PRECAUTIONS W/ASSISTANCE/DEVICE Description: STG Adhere to Safety Precautions With mod I Assistance/Device. Outcome: Progressing   Problem: RH PAIN MANAGEMENT Goal: RH STG PAIN MANAGED AT OR BELOW PT'S PAIN GOAL Description: <4 w/ prns Outcome: Progressing   Problem: RH KNOWLEDGE DEFICIT LIMB LOSS Goal: RH STG INCREASE KNOWLEDGE OF SELF CARE AFTER LIMB LOSS Description: Manage increase knowledge of self carte after limb loss with mod I assistance from wife using educational materials provided Outcome: Progressing   Problem: Education: Goal: Knowledge of General Education information will improve Description: Including pain rating scale, medication(s)/side effects and non-pharmacologic comfort measures Outcome: Progressing   Problem: Health Behavior/Discharge Planning: Goal: Ability to manage health-related needs will improve Outcome: Progressing   Problem: Clinical Measurements: Goal: Ability to maintain clinical measurements within normal limits will improve Outcome: Progressing Goal: Will remain free from infection Outcome: Progressing Goal: Diagnostic test results will improve Outcome: Progressing Goal: Respiratory complications will improve Outcome: Progressing Goal: Cardiovascular complication will be avoided Outcome: Progressing   Problem: Activity: Goal: Risk for activity intolerance will decrease Outcome: Progressing   Problem: Nutrition: Goal: Adequate nutrition will be maintained Outcome: Progressing    Problem: Coping: Goal: Level of anxiety will decrease Outcome: Progressing   Problem: Elimination: Goal: Will not experience complications related to bowel motility Outcome: Progressing Goal: Will not experience complications related to urinary retention Outcome: Progressing   Problem: Pain Managment: Goal: General experience of comfort will improve and/or be controlled Outcome: Progressing   Problem: Safety: Goal: Ability to remain free from injury will improve Outcome: Progressing   Problem: Skin Integrity: Goal: Risk for impaired skin integrity will decrease Outcome: Progressing

## 2024-02-15 NOTE — Progress Notes (Signed)
 PROGRESS NOTE   Subjective/Complaints:  No events overnight.  No acute complaints.  Nursing noticed increased drainage around bulb today without any actual output, have already called IR to evaluate today.  Patient denies any pain around this area, fevers, chills, nausea, or abdominal pain. Vitals stable     02/15/2024    4:47 AM 02/15/2024    3:23 AM 02/14/2024    7:12 PM  Vitals with BMI  Weight 157 lbs 10 oz    BMI 21.37    Systolic  121 130  Diastolic  67 79  Pulse  73 80   Responded well to 1 unit PRBCs yesterday, hemoglobin 9.1 today  ROS: Patient denies fever, rash, sore throat, blurred vision, dizziness, nausea, vomiting, diarrhea, cough, shortness of breath or chest pain, back/neck pain, headache, or mood change. +fatigue  Objective:   No results found.   Recent Labs    02/14/24 1438 02/15/24 0542  WBC 4.7  --   HGB 6.8* 9.1*  HCT 22.1* 28.2*  PLT 38*  --    Recent Labs    02/12/24 1217 02/14/24 1430  NA 131* 129*  K 4.6 4.4  CL 93* 92*  CO2 26 27  GLUCOSE 131* 113*  BUN 66* 57*  CREATININE 7.40* 5.56*  CALCIUM  8.5* 8.3*    Intake/Output Summary (Last 24 hours) at 02/15/2024 1001 Last data filed at 02/14/2024 2033 Gross per 24 hour  Intake 470 ml  Output 500 ml  Net -30 ml        Physical Exam: Vital Signs Blood pressure 121/67, pulse 73, temperature 98.1 F (36.7 C), temperature source Oral, resp. rate 16, height 6' (1.829 m), weight 71.5 kg, SpO2 100%.  Constitutional: No distress . Vital signs reviewed.  Sitting upright in bed. HEENT: NCAT, EOMI, oral membranes moist Neck: supple Cardiovascular: RRR without murmur. No JVD    Respiratory/Chest: CTA Bilaterally without wheezes or rales. Normal effort    GI/Abdomen: BS +, non-tender, non-distended, perc chole tube Ext: no clubbing, cyanosis, or edema Psych: pleasant and cooperative  Skin: Left aVF, left BK with s/s drainage, gauze,  ACE wrap in place + Left AV fistula, with palpable thrill + Left BKA site, with with clean overlying dressing, shrinker on + Right flank biliary tube with thin, milky drainage around wound, no drainage in bulb.  No expressible pus.  Mild surrounding erythema, no warmth or tenderness.  Neuro: Alert and oriented to person, place, reasons, month/year.  Speech clearer. Cranial nerves II through XII grossly intact. Bilateral upper extremity strength 5 out of 5. Right lower extremity strength 4 out of 5 proximal, 5 out of 5 distal. Left lower extremity at least 3-5, stable 9/ 14 Sensation intact light touch in all extremities    Assessment/Plan: 1. Functional deficits which require 3+ hours per day of interdisciplinary therapy in a comprehensive inpatient rehab setting. Physiatrist is providing close team supervision and 24 hour management of active medical problems listed below. Physiatrist and rehab team continue to assess barriers to discharge/monitor patient progress toward functional and medical goals  Care Tool:  Bathing    Body parts bathed by patient: Right arm, Left arm, Chest, Abdomen, Front  perineal area, Buttocks, Right upper leg, Left upper leg, Face   Body parts bathed by helper: Right lower leg Body parts n/a: Left lower leg   Bathing assist Assist Level: Minimal Assistance - Patient > 75%     Upper Body Dressing/Undressing Upper body dressing   What is the patient wearing?: Pull over shirt    Upper body assist Assist Level: Set up assist    Lower Body Dressing/Undressing Lower body dressing      What is the patient wearing?: Underwear/pull up, Pants, Ace wrap/stump shrinker     Lower body assist Assist for lower body dressing: Moderate Assistance - Patient 50 - 74%     Toileting Toileting    Toileting assist Assist for toileting: Maximal Assistance - Patient 25 - 49%     Transfers Chair/bed transfer  Transfers assist     Chair/bed transfer assist  level: Minimal Assistance - Patient > 75%     Locomotion Ambulation   Ambulation assist   Ambulation activity did not occur: Safety/medical concerns          Walk 10 feet activity   Assist  Walk 10 feet activity did not occur: Safety/medical concerns        Walk 50 feet activity   Assist Walk 50 feet with 2 turns activity did not occur: Safety/medical concerns         Walk 150 feet activity   Assist Walk 150 feet activity did not occur: Safety/medical concerns         Walk 10 feet on uneven surface  activity   Assist Walk 10 feet on uneven surfaces activity did not occur: Safety/medical concerns         Wheelchair     Assist Is the patient using a wheelchair?: Yes Type of Wheelchair: Manual    Wheelchair assist level: Supervision/Verbal cueing Max wheelchair distance: 150'    Wheelchair 50 feet with 2 turns activity    Assist        Assist Level: Supervision/Verbal cueing   Wheelchair 150 feet activity     Assist      Assist Level: Supervision/Verbal cueing   Blood pressure 121/67, pulse 73, temperature 98.1 F (36.7 C), temperature source Oral, resp. rate 16, height 6' (1.829 m), weight 71.5 kg, SpO2 100%.  Medical Problem List and Plan: 1. Functional deficits secondary to left BKA 01/30/2024 after failed TMA with history of PAD status post PCI to left tibia/fibula angioplasty              -patient may  shower             -ELOS/Goals: 7 days with mod I goals for mobility and self-care  Chart and therapy notes reviewed, continue CIR, discussed patient's progress with therapy Grounds pass ordered Vitamin D3/Metanx/Vitamin B/C complex ordered F/u with me or Fidela in clinic within 1 month Would benefit from home health aide upon discharge   Should have cardiology follow-up within 1 week because cardiology medications have been changed due to hypotension  2.  Anemia: Hgb reviewed and is stable at 8.3, continue SQ heparin   q12H, stool occult ordered             -antiplatelet therapy: Aspirin  81 mg daily, Plavix  75 mg daily   - 9-13: Hemoglobin 6.8 with dialysis today.  Getting transfusion, repeat H&H in AM.  Had small stool this morning, no noted blood.-->  Repleted to 9.1.  Repeat labs in a.m., nephrology adjusted Aranesp  on 9-11  3. Pain Management: decrease oxycodone  to 5mg  q6h given hypotension/fatigue Continue gabapentin   4. Insomnia: continue prn melatonin  5. Neuropsych/cognition: This patient is capable of making decisions on his own behalf.  -9/7pt appears clearer today. See above  6. Chole site irritation/yellow drainage: consulted IR - Recent acute cholecystitis June 2025.  Currently maintained on Augmentin  and linezolid  for 3 to 4 weeks and will confirm duration of antibiotic.  Radiology following  for percutaneous cholecystostomy tube for recent cholecystitis. Awaiting plan for continued suction drainage versus change to gravity bag. IR consulted since chole tube site appears irritated, reviewed their note- recommending to maintain drain  - 9-14: Nursing has contacted IR to look at drain given surrounding fluid  7. Fluids/Electrolytes/Nutrition: Routine in and out with follow-up chemistries  8.  Aortic stenosis.  Status post TAVR 01/20/2024  9.  End-stage renal disease.  Continue hemodialysis as per renal services             -HD at end of day to allow participation in therapy  - HD T TH S  10.  Hyperlipidemia.  Lipitor  11.  Hypotension: coreg  decreased to HS, imdur  decreased by nephrology to 30mg  daily.  Monitor with increased mobility  - 9-13: Blood pressure appears improved with decreased Imdur .    02/15/2024    4:47 AM 02/15/2024    3:23 AM 02/14/2024    7:12 PM  Vitals with BMI  Weight 157 lbs 10 oz    BMI 21.37    Systolic  121 130  Diastolic  67 79  Pulse  73 80   12.  Hypothyroidism.  Continue Synthroid   13.  Acute on chronic anemia: see #2  14.  CAD with history of  CABG.  No chest pain or shortness of breath.  Continue Imdur   15.  BPH: d/c flomax  given hypotension, resolved  16.  Chronic diastolic congestive heart failure.  Monitor for any signs of fluid overload.  Follow-up per cardiology services.             -daily weights             -volume mgt with HD, weight reviewed and is decreased Filed Weights   02/14/24 1437 02/14/24 1829 02/15/24 0447  Weight: 71.9 kg 71.4 kg 71.5 kg    17.  Constipation.  No bowel movements recorded.  Will check with patient today--states last this a.m 9-13.    LOS: 10 days A FACE TO FACE EVALUATION WAS PERFORMED  Phillip [name] 02/15/2024, 10:01 AM

## 2024-02-15 NOTE — Progress Notes (Addendum)
 Referring Provider(s): Dr. DELENA Odell Castor  Supervising Physician: Johann Sieving  Patient Status:  [facility] - In-pt  Chief Complaint: Acute cholecystitis s/p cholecystostomy tube placement w/ exchange on 9/2  Brief History:  68 year old male with a history of ESRD on HD, complicated cardiac history, and GERD who presented to the ED in June with complaints of acute abdominal pain after being found febrile at dialysis. Due to significant cardiac history and ESRD, patient was not deemed a surgical candidate and was referred to IR for perc chole placement which he underwent on 6/11. He followed up with Surgery on 8/11 who recommended no consideration for cholecystectomy until after his TAVR. Unfortunately, he ended up being admitted to the hospital on 8/13 following amputation of his left great toe. During his recovery, he was noted to have purulent drainage from the insertion site of his chole drain. The drain was subsequently exchanged on 9/2 in IR by Dr. JONETTA Sides.   Subjective:  Nurse reports minimal output in drain for the past several days as well as purulence around the insertion site. Wife, at the bedside, also reports that patient did not have any drainage from insertion site until drain was exchanged on 9/2. Since then she has noted small amounts of intermittent drainage from the insertion site. Patient denies any pain at insertion site, fevers/chills, nausea/vomiting.    Allergies: Patient has no known allergies.  Medications: Prior to Admission medications   Medication Sig Start Date End Date Taking? Authorizing Provider  acetaminophen  (TYLENOL ) 500 MG tablet Take 1,000 mg by mouth every 6 (six) hours as needed for moderate pain or headache.     [provider]  amoxicillin -clavulanate (AUGMENTIN ) 500-125 MG tablet Take 1 tablet by mouth 2 (two) times daily. 02/05/24   Sigdel, Santosh, MD  aspirin  EC 81 MG tablet Take 1 tablet (81 mg total) by mouth daily. Swallow whole.  07/25/23   Thukkani, Arun K, MD  atorvastatin  (LIPITOR) 20 MG tablet Take 1 tablet (20 mg total) by mouth daily. 07/25/23   Thukkani, Arun K, MD  bisacodyl  (DULCOLAX) 5 MG EC tablet Take 1 tablet (5 mg total) by mouth daily as needed for moderate constipation. 02/05/24   Sigdel, Santosh, MD  carvedilol  (COREG ) 3.125 MG tablet Take 1 tablet (3.125 mg total) by mouth 2 (two) times daily. 05/20/23 05/02/24  Carlin Delon BROCKS, NP  clopidogrel  (PLAVIX ) 75 MG tablet Take 1 tablet (75 mg total) by mouth daily. 12/26/23   Marea Selinda RAMAN, MD  gabapentin  (NEURONTIN ) 300 MG capsule Take 1 capsule (300 mg total) by mouth at bedtime. Patient taking differently: Take 600 mg by mouth 2 (two) times daily. 08/25/23   Arrien, Mauricio Daniel, MD  insulin  glargine (LANTUS ) 100 UNIT/ML injection Inject 0.07 mLs (7 Units total) into the skin at bedtime. Inject 7 units at bedtime as needed after checking glucose 02/05/24   Mcarthur Pick, MD  isosorbide  mononitrate (IMDUR ) 120 MG 24 hr tablet Take 1 tablet (120 mg total) by mouth daily. 08/27/23   Revankar, Jennifer SAUNDERS, MD  latanoprost  (XALATAN ) 0.005 % ophthalmic solution Place 1 drop into both eyes at bedtime. 04/15/23   [provider]  levothyroxine  (SYNTHROID ) 50 MCG tablet Take 50 mcg by mouth daily. 04/05/23   [provider]  linezolid  (ZYVOX ) 600 MG tablet Take 1 tablet (600 mg total) by mouth every 12 (twelve) hours. 02/05/24   Sigdel, Santosh, MD  Methoxy PEG-Epoetin Beta (MIRCERA IJ) 30 mcg. 12/16/23 12/14/24  [provider]  nitroGLYCERIN  (NITROSTAT ) 0.4 MG SL tablet Place 1 tablet (0.4 mg total) under the tongue every 5 (five) minutes as needed for chest pain. 11/20/20   Revankar, Jennifer SAUNDERS, MD  sevelamer  carbonate (RENVELA ) 800 MG tablet Take 800 mg by mouth 3 (three) times daily with meals. 09/04/23 11/29/24  [provider]  sodium chloride  flush (NS) 0.9 % SOLN Inject 5 mLs into the vein daily for 15 days. flush drain daily with 5 cc NS,  record output daily, dressing changes every 2-3 days or earlier if soiled 11/14/23 11/29/23  Darci Pore, MD  sodium chloride  flush 0.9 % SOLN injection 10 mLs by Intracatheter route every 3 (three) days. 01/02/24 04/01/24  Covington, Jamie R, NP  tamsulosin  (FLOMAX ) 0.4 MG CAPS capsule Take 0.4 mg by mouth daily after breakfast. 04/19/20   [provider]  traMADol  (ULTRAM ) 50 MG tablet Take 50 mg by mouth every 8 (eight) hours as needed. 09/15/23   [provider]    Vital Signs: BP 121/67 (BP Location: Right Arm)   Pulse 73   Temp 98.1 F (36.7 C) (Oral)   Resp 16   Ht 6' (1.829 m)   Wt 157 lb 10.1 oz (71.5 kg)   SpO2 100%   BMI 21.38 kg/m   Physical Exam Constitutional:      Appearance: Normal appearance.  Cardiovascular:     Rate and Rhythm: Normal rate.  Pulmonary:     Effort: Pulmonary effort is normal.  Abdominal:     General: Abdomen is flat.     Palpations: Abdomen is soft.     Tenderness: There is no abdominal tenderness.     Comments: Overlying bandage soaked with purulent material; insertion site is non-erythematous. Palpation of the insertion site leads to a small amount of pus exiting near tube. Non-tender to palpation. Scant purulent fluid noted in bulb to suction. Drain does not flush or aspirate   Musculoskeletal:     Comments: Left BKA  Neurological:     Mental Status: He is alert.     Drain Location: RUQ Size: Fr size: 10 Fr Date of placement: 02/03/24  Currently to: Drain collection device: suction bulb 24 hour output:  Output by Drain (mL) 02/13/24 0701 - 02/13/24 1900 02/13/24 1901 - 02/14/24 0700 02/14/24 0701 - 02/14/24 1900 02/14/24 1901 - 02/15/24 0700 02/15/24 0701 - 02/15/24 1117  Closed System Drain 1 Right;Lateral Abdomen Other (Comment) 10 Fr.    0     Interval imaging/drain manipulation:  CT A/P WO Contrast - 02/05/24  IMPRESSION: 1. Percutaneous cholecystostomy with pigtail tip in the gallbladder lumen in similar  position as the prior CT. 2. Cholelithiasis.  Current examination: Drain does not flush or aspirate Insertion site with purulence. Suture in place. Dressed appropriately.   Labs:  CBC: Recent Labs    02/09/24 0515 02/10/24 0444 02/11/24 1853 02/14/24 1438 02/15/24 0542  WBC 5.2 5.3 4.9 4.7  --   HGB 8.7* 8.3* 8.3* 6.8* 9.1*  HCT 28.1* 25.3* 26.8* 22.1* 28.2*  PLT 103* 83* 74* 38*  --     COAGS: Recent Labs    11/11/23 1930 11/12/23 1011 01/19/24 1844 02/09/24 0515  INR 1.4* 1.3* 1.1 1.1  APTT  --   --  39*  --     BMP: Recent Labs    02/09/24 0515 02/10/24 0444 02/12/24 1217 02/14/24 1430  NA 129* 129* 131* 129*  K 5.2* 5.2* 4.6 4.4  CL 91* 89* 93* 92*  CO2 25  23 26 27   GLUCOSE 110* 91 131* 113*  BUN 69* 95* 66* 57*  CALCIUM  8.2* 8.0* 8.5* 8.3*  CREATININE 8.10* 9.37* 7.40* 5.56*  GFRNONAA 7* 6* 7* 10*    LIVER FUNCTION TESTS: Recent Labs    11/13/23 0821 01/15/24 0338 01/19/24 1844 01/26/24 0328 02/03/24 1630 02/09/24 0515 02/10/24 0444 02/12/24 1217 02/14/24 1430  BILITOT 2.4* 0.8 0.8 0.8  --   --   --   --   --   AST 187* 19 22 17   --   --   --   --   --   ALT 97* 11 <5 <5  --   --   --   --   --   ALKPHOS 123 142* 161* 96  --   --   --   --   --   PROT 8.1 7.6 9.4* 8.4*  --   --   --   --   --   ALBUMIN  2.7* 2.1* 2.5* 2.4*   < > 2.2* 2.2* 2.3* 2.1*   < > = values in this interval not displayed.    Assessment and Plan:  Acute Cholecystitis: Phillip [name] is a 68 y.o. male with a history of cardiac complications and acute cholecystis deemed to be a poor surgical candidate. Underwent initial drain placement on 6/11. He was admitted for an unrelated amputation of his left great toe when the team noted purulent drainage from his insertion site. He subsequently underwent cholecystostomy drain exchange on 9/2 with Dr. JONETTA Sides.   -Patient with continued purulent output surrounding drain insertion site -No erythema or tenderness to  palpation -No output via drain for the past 3 days per nurse -Unable to flush or aspirate drain  -Discussed with patient that we will re-image to evaluate for appropriate placement and see him tomorrow, 9/15   While Inpatient: Continue TID flushes with 5 cc NS. Record output Q shift. Dressing changes QD or PRN if soiled.  Call IR APP or on call IR MD if difficulty flushing or sudden change in drain output.   Discharge planning: Please contact IR APP or on call IR MD prior to patient d/c to ensure appropriate follow up plans are in place. Typically patient will undergo routine drain exchange in 6-8 weeks. Cholecystomy tube drain will need to remain in place at least 6 -8  weeks unless gallbaldder surgically removed in interim; Once daily with 5 ml sterile normal saline as outpatient; will schedule f/u cholangiogram/ drain/ exchange in 6-8 weeks. IR scheduler will contact patient with date/time of appointment. Patient will need to flush drain QD with 5 cc NS, record output QD, dressing changes every 2-3 days or earlier if soiled.     IR will continue to follow - please call with questions or concerns.   Thank you for allowing our service to participate in [facility]ES 's care.   Electronically Signed: Azaryah Heathcock M Kalei Mckillop, PA-C 02/15/2024, 11:14 AM    I spent a total of 15 Minutes at the the patient's bedside AND on the patient's hospital floor or unit, greater than 50% of which was counseling/coordinating care for drain care follow up.

## 2024-02-15 NOTE — Progress Notes (Signed)
 [facility] Progress Note   Subjective:   Patient seen and examined at bedside.  Reports issues with cannulation again yesterday but otherwise dialysis went well.  Denies CP, SOB, abdominal pain and n/v/d.  Hgb 6.8 when in dialysis yesterday, give 1 unit pRBC with treatment.   Objective Vitals:   02/14/24 1829 02/14/24 1912 02/15/24 0323 02/15/24 0447  BP: 127/65 130/79 121/67   Pulse: 78 80 73   Resp: 20 16 16    Temp: 98.3 F (36.8 C) 98.2 F (36.8 C) 98.1 F (36.7 C)   TempSrc: Oral Oral Oral   SpO2: 100% 100% 100%   Weight: 71.4 kg   71.5 kg  Height:       Physical Exam General:well appearing male in NAD Heart:RRR, no mrg Lungs:CTAB, nml WOB on RA Abdomen:soft, NTND Extremities:no LE edema Dialysis Access: LU AVF +b/t   Filed Weights   02/14/24 1437 02/14/24 1829 02/15/24 0447  Weight: 71.9 kg 71.4 kg 71.5 kg    Intake/Output Summary (Last 24 hours) at 02/15/2024 0924 Last data filed at 02/14/2024 2033 Gross per 24 hour  Intake 470 ml  Output 500 ml  Net -30 ml    Additional Objective Labs: Basic Metabolic Panel: Recent Labs  Lab 02/10/24 0444 02/12/24 1217 02/14/24 1430  NA 129* 131* 129*  K 5.2* 4.6 4.4  CL 89* 93* 92*  CO2 23 26 27   GLUCOSE 91 131* 113*  BUN 95* 66* 57*  CREATININE 9.37* 7.40* 5.56*  CALCIUM  8.0* 8.5* 8.3*  PHOS 5.7* 4.1 3.1   Liver Function Tests: Recent Labs  Lab 02/10/24 0444 02/12/24 1217 02/14/24 1430  ALBUMIN  2.2* 2.3* 2.1*   CBC: Recent Labs  Lab 02/09/24 0515 02/10/24 0444 02/11/24 1853 02/14/24 1438 02/15/24 0542  WBC 5.2 5.3 4.9 4.7  --   NEUTROABS 3.7 3.6 3.4  --   --   HGB 8.7* 8.3* 8.3* 6.8* 9.1*  HCT 28.1* 25.3* 26.8* 22.1* 28.2*  MCV 96.9 95.5 96.4 96.9  --   PLT 103* 83* 74* 38*  --     Medications:   (feeding supplement) PROSource Plus  30 mL Oral BID BM   sodium chloride    Intravenous Once   amoxicillin -clavulanate  1 tablet Oral QHS   aspirin  EC  81 mg Oral Daily    atorvastatin   20 mg Oral Daily   B-complex with vitamin C  1 tablet Oral Daily   carvedilol   3.125 mg Oral QHS   Chlorhexidine  Gluconate Cloth  6 each Topical Q0600   cholecalciferol   1,000 Units Oral Daily   clopidogrel   75 mg Oral Daily   darbepoetin (ARANESP ) injection - DIALYSIS  100 mcg Subcutaneous Weekly   doxycycline   100 mg Oral Q12H   gabapentin   200 mg Oral QHS   heparin  injection (subcutaneous)  5,000 Units Subcutaneous Q12H   isosorbide  mononitrate  30 mg Oral Daily   l-methylfolate-B6-B12  1 tablet Oral Daily   latanoprost   1 drop Both Eyes QHS   levothyroxine   50 mcg Oral Daily   nutrition supplement (JUVEN)  1 packet Oral BID BM   sevelamer  carbonate  800 mg Oral TID WC    Dialysis Orders: TTS [facility] 4hr, 350/800, EDW 81kg, 2K/2.5Ca, L AVF, no heparin  - no VDRA, ESA ordered but not yet given   Assessment/Plan: 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 Per VVS.  In CIR. Severe aortic stenosis s/p TAVR 01/20/24:  Per PMD.  ESRD: HD TTS. Next HD 02/17/24.  Vascular Access: S/p AVF infiltration and difficulty cannulating at his outpatient HD center. F'gram completed 9/8 - normal/patent AVF.  Needs expert cannulator with every treatment.   HTN/volume: BP in goal. Continue carvedilol  3.125mg  BID and Imdur  30mg  every day. S/p amputation, will need lower edw on discharge.  Anemia of ESRD: Hgb 6.8 yesterday, s/p 1unit pRBC with HD.  Aranesp  increased to 100mcg on 02/12/24. Secondary HPTH: CorrCa ok and phos ok- continue sevelamer  as binder. Nutrition: Alb low, continue supps. Hx cholecystitis with perc chole drain still in place: Erythema/purulence around drain, on PO abx. S/p CT without abscess, and now s/p tube exchange. On Augmentin . T2DM Dispo: In CIR  Liz Claiborne, PA-C [facility] 02/15/2024,9:24 AM  LOS: 10 days

## 2024-02-16 LAB — CBC
HCT: 26.1 % — ABNORMAL LOW (ref 39.0–52.0)
Hemoglobin: 8.3 g/dL — ABNORMAL LOW (ref 13.0–17.0)
MCH: 30.2 pg (ref 26.0–34.0)
MCHC: 31.8 g/dL (ref 30.0–36.0)
MCV: 94.9 fL (ref 80.0–100.0)
Platelets: 34 K/uL — ABNORMAL LOW (ref 150–400)
RBC: 2.75 MIL/uL — ABNORMAL LOW (ref 4.22–5.81)
RDW: 14.8 % (ref 11.5–15.5)
WBC: 4.4 K/uL (ref 4.0–10.5)
nRBC: 0 % (ref 0.0–0.2)

## 2024-02-16 MED ORDER — DOCUSATE SODIUM 100 MG PO CAPS
100.0000 mg | ORAL_CAPSULE | Freq: Every day | ORAL | Status: DC
  Administered 2024-02-16 – 2024-02-22 (×6): 100 mg via ORAL
  Filled 2024-02-16 (×6): qty 1

## 2024-02-16 NOTE — Progress Notes (Signed)
 Physical Therapy Session Note  Patient Details  Name: Phillip [name] MRN: [id number] Date of Birth: 25-Nov-1955  Today's Date: 02/16/2024 PT Individual Time: 1405-1503 PT Individual Time Calculation (min): 58 min   Short Term Goals: Week 1:  PT Short Term Goal 1 (Week 1): Pt will perform basic transfers with min A PT Short Term Goal 1 - Progress (Week 1): Met PT Short Term Goal 2 (Week 1): Pt will be able to perform sit <> stands with min A PT Short Term Goal 2 - Progress (Week 1): Progressing toward goal PT Short Term Goal 3 (Week 1): Pt will be able to initiate gait PT Short Term Goal 3 - Progress (Week 1): Progressing toward goal Week 2:  PT Short Term Goal 1 (Week 2): STG=LTG due to LOS  Skilled Therapeutic Interventions/Progress Updates:    W/c mobility on unit mod I with extra time x 150' x 2 to go to and from therapy gyms for functional mobility training and general strengthening/endurance.  Squat pivot transfers with focus on pt managing w/c parts and setting up w/c for transfers throughout session x 5 with overall min A/CGA with cues for technique and lifting bottom vs scooting.  Nustep on level 5 x 10 min on trail runner with focus on functional strengthening and endurance to increase overall transfers [employer] during last part of Nustep training due to L burning and sensations for pain management with patient reported improvement with mirror therapy.  Independent with bed mobility on mat table. Performed supine and seated therex for functional strengthening and ROM to increase ability with transfers, ROM, and preparation for prosthesis: L hip and knee flexion x 15 reps  sidelying L hip abduction x 15 reps L hip extension in sidelying x 15 reps Supine modified bridging x 15 reps Supine modified crunch reaches x 15 reps Supine L SLR x 15 reps Seated diagonals with 2kg medicine ball each direction x 15 reps Seated trunk rotation with 2 kg medicine ball each direction  x 10 reps Returned back to room and transferred to recliner with scoot pivot technique with CGA overall. See details above in regards to cueing. All needs in reach.  Pt independent with donning and doffing limb guard this session.   Therapy Documentation Precautions:  Precautions Precautions: Fall Recall of Precautions/Restrictions: Intact Precaution/Restrictions Comments: R chole drain, L BKA Required Braces or Orthoses: Other Brace Other Brace: limb guard Restrictions Weight Bearing Restrictions Per Provider Order: No LLE Weight Bearing Per Provider Order: Partial weight bearing    Pain:  Premedicated for pain in L residual limb. Also used mirror therapy during session    Therapy/Group: Individual Therapy  [name] Elnor, PT, DPT, CBIS  02/16/2024, 3:16 PM

## 2024-02-16 NOTE — Progress Notes (Signed)
 [facility] Progress Note   Subjective:   Seen in room - feels ok this morning. Per notes, looks like may be going for chole tube manipulation today. For HD tomorrow. No CP/dyspnea.  Objective Vitals:   02/15/24 0447 02/15/24 1448 02/15/24 1937 02/16/24 0408  BP:  (!) 112/58 121/72 116/73  Pulse:  70 81 72  Resp:  16 19 19   Temp:  97.7 F (36.5 C) 97.7 F (36.5 C) 98.4 F (36.9 C)  TempSrc:  Oral Oral Oral  SpO2:  100% 100% 99%  Weight: 71.5 kg   71.9 kg  Height:       Physical Exam General: Well appearing man, NAD. Room air Heart: RRR Lungs: CTAB Abdomen: soft Extremities: no LE edema Dialysis Access:  LUE AVF +t/b  Additional Objective Labs: Basic Metabolic Panel: Recent Labs  Lab 02/10/24 0444 02/12/24 1217 02/14/24 1430  NA 129* 131* 129*  K 5.2* 4.6 4.4  CL 89* 93* 92*  CO2 23 26 27   GLUCOSE 91 131* 113*  BUN 95* 66* 57*  CREATININE 9.37* 7.40* 5.56*  CALCIUM  8.0* 8.5* 8.3*  PHOS 5.7* 4.1 3.1   Liver Function Tests: Recent Labs  Lab 02/10/24 0444 02/12/24 1217 02/14/24 1430  ALBUMIN  2.2* 2.3* 2.1*   CBC: Recent Labs  Lab 02/10/24 0444 02/11/24 1853 02/14/24 1438 02/15/24 0542 02/16/24 0603  WBC 5.3 4.9 4.7  --  4.4  NEUTROABS 3.6 3.4  --   --   --   HGB 8.3* 8.3* 6.8* 9.1* 8.3*  HCT 25.3* 26.8* 22.1* 28.2* 26.1*  MCV 95.5 96.4 96.9  --  94.9  PLT 83* 74* 38*  --  34*   Studies/Results: CT ABDOMEN PELVIS WO CONTRAST Result Date: 02/15/2024 CLINICAL DATA:  Cholecystostomy tube not flushing. EXAM: CT ABDOMEN AND PELVIS WITHOUT CONTRAST TECHNIQUE: Multidetector CT imaging of the abdomen and pelvis was performed following the standard protocol without IV contrast. RADIATION DOSE REDUCTION: This exam was performed according to the departmental dose-optimization program which includes automated exposure control, adjustment of the mA and/or kV according to patient size and/or use of iterative reconstruction technique. COMPARISON:   CT abdomen pelvis dated 02/05/2024. FINDINGS: Evaluation of this exam is limited in the absence of intravenous contrast. Lower chest: The visualized lung bases are clear. Aortic valve repair. Coronary vascular calcification. No intra-abdominal free air or free fluid. Hepatobiliary: The liver is unremarkable. No BD dilatation. High attenuating content within the gallbladder likely combination of sludge, stones, and injected contrast. There is diffuse thickened and hazy appearance of the gallbladder wall likely related to chronic inflammation. Percutaneous cholecystostomy with pigtail tip in the gallbladder fundus. No extraluminal fluid collection noted. Pancreas: Unremarkable. No pancreatic ductal dilatation or surrounding inflammatory changes. Spleen: Normal in size without focal abnormality. Adrenals/Urinary Tract: The adrenal glands unremarkable. There is no hydronephrosis or nephrolithiasis on either side. The visualized ureters and urinary bladder appear unremarkable. Stomach/Bowel: There is moderate stool throughout the colon. There is no bowel obstruction or active inflammation. Appendectomy. Vascular/Lymphatic: Moderate aortoiliac atherosclerotic disease. The IVC is unremarkable. No portal venous gas. There is no adenopathy. Reproductive: The prostate and seminal vesicles are grossly remarkable. Other: There is skin thickening and subcutaneous stranding of the left anterior abdominal wall may be related to contusion. No fluid collection or hematoma. Musculoskeletal: Degenerative changes of the spine. No acute osseous pathology. Bilateral L5 pars defects with grade 1 anterolisthesis. IMPRESSION: 1. Percutaneous cholecystostomy with pigtail tip in the gallbladder fundus. 2. No hydronephrosis or nephrolithiasis.  3. No bowel obstruction. 4.  Aortic Atherosclerosis (ICD10-I70.0). Electronically Signed   By: Vanetta Chou M.D.   On: 02/15/2024 19:17   Medications:   (feeding supplement) PROSource Plus  30 mL  Oral BID BM   sodium chloride    Intravenous Once   amoxicillin -clavulanate  1 tablet Oral QHS   aspirin  EC  81 mg Oral Daily   atorvastatin   20 mg Oral Daily   B-complex with vitamin C  1 tablet Oral Daily   carvedilol   3.125 mg Oral QHS   Chlorhexidine  Gluconate Cloth  6 each Topical Q0600   cholecalciferol   1,000 Units Oral Daily   clopidogrel   75 mg Oral Daily   darbepoetin (ARANESP ) injection - DIALYSIS  100 mcg Subcutaneous Weekly   doxycycline   100 mg Oral Q12H   gabapentin   200 mg Oral QHS   heparin  injection (subcutaneous)  5,000 Units Subcutaneous Q12H   isosorbide  mononitrate  30 mg Oral Daily   l-methylfolate-B6-B12  1 tablet Oral Daily   latanoprost   1 drop Both Eyes QHS   levothyroxine   50 mcg Oral Daily   nutrition supplement (JUVEN)  1 packet Oral BID BM   sevelamer  carbonate  800 mg Oral TID WC    Dialysis Orders TTS [facility] 4hr, 350/800, EDW 81kg, 2K/2.5Ca, L AVF, no heparin  - no VDRA, ESA ordered but not yet given   Assessment/Plan: 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 Per VVS.  In CIR. Severe aortic stenosis s/p TAVR 01/20/24: Per PMD.  ESRD: HD TTS - for HD tomorrow 9/16. Vascular Access: S/p AVF infiltration and difficulty cannulating at his outpatient HD center. F'gram completed 9/8 - normal/patent AVF.  Needs expert cannulator with every treatment.   HTN/volume: BP in goal. Continue carvedilol  3.125mg  BID and Imdur  30mg  every day. S/p amputation, will need lower edw on discharge.  Anemia of ESRD: Hgb 8.3 today. S/p 1U PRBCs on 9/13. Continue Aranesp  100mcg q Friday. Secondary HPTH: CorrCa ok and phos ok- continue sevelamer  as binder. Nutrition: Alb low, continue supps. Hx cholecystitis with perc chole drain still in place: Erythema/purulence around drain. S/p tube exchange 9/2. Remains on Augmentin . Repeat CT 9/14 without abscess or other changes to area. T2DM Dispo: In CIR     Izetta Boehringer,  PA-C 02/16/2024, 10:11 AM  [employer]

## 2024-02-16 NOTE — Progress Notes (Signed)
 Patient ID: Phillip [name], male   DOB: 1955-08-07, 68 y.o.   MRN: [id number]  [facility] picked up for [facility] services

## 2024-02-16 NOTE — Progress Notes (Signed)
 PROGRESS NOTE   Subjective/Complaints: Feels well this morning Appreciate IR following regarding purulent output surrounding chole drain insertion site Asks for potato chips and orange juice    02/16/2024    4:08 AM 02/15/2024    7:37 PM 02/15/2024    2:48 PM  Vitals with BMI  Weight 158 lbs 8 oz    BMI 21.49    Systolic 116 121 887  Diastolic 73 72 58  Pulse 72 81 70   ROS: Patient denies fever, rash, sore throat, blurred vision, dizziness, nausea, vomiting, diarrhea, cough, shortness of breath or chest pain, back/neck pain, headache, or mood change. +fatigue  Objective:   CT ABDOMEN PELVIS WO CONTRAST Result Date: 02/15/2024 CLINICAL DATA:  Cholecystostomy tube not flushing. EXAM: CT ABDOMEN AND PELVIS WITHOUT CONTRAST TECHNIQUE: Multidetector CT imaging of the abdomen and pelvis was performed following the standard protocol without IV contrast. RADIATION DOSE REDUCTION: This exam was performed according to the departmental dose-optimization program which includes automated exposure control, adjustment of the mA and/or kV according to patient size and/or use of iterative reconstruction technique. COMPARISON:  CT abdomen pelvis dated 02/05/2024. FINDINGS: Evaluation of this exam is limited in the absence of intravenous contrast. Lower chest: The visualized lung bases are clear. Aortic valve repair. Coronary vascular calcification. No intra-abdominal free air or free fluid. Hepatobiliary: The liver is unremarkable. No BD dilatation. High attenuating content within the gallbladder likely combination of sludge, stones, and injected contrast. There is diffuse thickened and hazy appearance of the gallbladder wall likely related to chronic inflammation. Percutaneous cholecystostomy with pigtail tip in the gallbladder fundus. No extraluminal fluid collection noted. Pancreas: Unremarkable. No pancreatic ductal dilatation or surrounding  inflammatory changes. Spleen: Normal in size without focal abnormality. Adrenals/Urinary Tract: The adrenal glands unremarkable. There is no hydronephrosis or nephrolithiasis on either side. The visualized ureters and urinary bladder appear unremarkable. Stomach/Bowel: There is moderate stool throughout the colon. There is no bowel obstruction or active inflammation. Appendectomy. Vascular/Lymphatic: Moderate aortoiliac atherosclerotic disease. The IVC is unremarkable. No portal venous gas. There is no adenopathy. Reproductive: The prostate and seminal vesicles are grossly remarkable. Other: There is skin thickening and subcutaneous stranding of the left anterior abdominal wall may be related to contusion. No fluid collection or hematoma. Musculoskeletal: Degenerative changes of the spine. No acute osseous pathology. Bilateral L5 pars defects with grade 1 anterolisthesis. IMPRESSION: 1. Percutaneous cholecystostomy with pigtail tip in the gallbladder fundus. 2. No hydronephrosis or nephrolithiasis. 3. No bowel obstruction. 4.  Aortic Atherosclerosis (ICD10-I70.0). Electronically Signed   By: Vanetta Chou M.D.   On: 02/15/2024 19:17     Recent Labs    02/14/24 1438 02/15/24 0542 02/16/24 0603  WBC 4.7  --  4.4  HGB 6.8* 9.1* 8.3*  HCT 22.1* 28.2* 26.1*  PLT 38*  --  34*   Recent Labs    02/14/24 1430  NA 129*  K 4.4  CL 92*  CO2 27  GLUCOSE 113*  BUN 57*  CREATININE 5.56*  CALCIUM  8.3*    Intake/Output Summary (Last 24 hours) at 02/16/2024 1137 Last data filed at 02/16/2024 0800 Gross per 24 hour  Intake 590 ml  Output --  Net 590 ml        Physical Exam: Vital Signs Blood pressure 116/73, pulse 72, temperature 98.4 F (36.9 C), temperature source Oral, resp. rate 19, height 6' (1.829 m), weight 71.9 kg, SpO2 99%.  Constitutional: No distress . Vital signs reviewed.  Sitting upright in bed. HEENT: NCAT, EOMI, oral membranes moist Neck: supple Cardiovascular: RRR without  murmur. No JVD    Respiratory/Chest: CTA Bilaterally without wheezes or rales. Normal effort    GI/Abdomen: BS +, non-tender, non-distended, perc chole tube Ext: no clubbing, cyanosis, or edema Psych: pleasant and cooperative  Skin: Left aVF, left BK with s/s drainage, gauze, ACE wrap in place + Left AV fistula, with palpable thrill + Left BKA site, with with clean overlying dressing, shrinker on + Right flank biliary tube with thin, milky drainage around wound, no drainage in bulb.  No expressible pus.  Mild surrounding erythema, no warmth or tenderness.  Neuro: Alert and oriented to person, place, reasons, month/year.  Speech clearer. Cranial nerves II through XII grossly intact. Bilateral upper extremity strength 5 out of 5. Right lower extremity strength 4 out of 5 proximal, 5 out of 5 distal. Left lower extremity at least 3-5, stable 9/15 Sensation intact light touch in all extremities    Assessment/Plan: 1. Functional deficits which require 3+ hours per day of interdisciplinary therapy in a comprehensive inpatient rehab setting. Physiatrist is providing close team supervision and 24 hour management of active medical problems listed below. Physiatrist and rehab team continue to assess barriers to discharge/monitor patient progress toward functional and medical goals  Care Tool:  Bathing    Body parts bathed by patient: Right arm, Left arm, Chest, Abdomen, Front perineal area, Buttocks, Right upper leg, Left upper leg, Face   Body parts bathed by helper: Right lower leg Body parts n/a: Left lower leg   Bathing assist Assist Level: Minimal Assistance - Patient > 75%     Upper Body Dressing/Undressing Upper body dressing   What is the patient wearing?: Pull over shirt    Upper body assist Assist Level: Set up assist    Lower Body Dressing/Undressing Lower body dressing      What is the patient wearing?: Underwear/pull up, Pants, Ace wrap/stump shrinker     Lower  body assist Assist for lower body dressing: Moderate Assistance - Patient 50 - 74%     Toileting Toileting    Toileting assist Assist for toileting: Maximal Assistance - Patient 25 - 49%     Transfers Chair/bed transfer  Transfers assist     Chair/bed transfer assist level: Minimal Assistance - Patient > 75%     Locomotion Ambulation   Ambulation assist   Ambulation activity did not occur: Safety/medical concerns          Walk 10 feet activity   Assist  Walk 10 feet activity did not occur: Safety/medical concerns        Walk 50 feet activity   Assist Walk 50 feet with 2 turns activity did not occur: Safety/medical concerns         Walk 150 feet activity   Assist Walk 150 feet activity did not occur: Safety/medical concerns         Walk 10 feet on uneven surface  activity   Assist Walk 10 feet on uneven surfaces activity did not occur: Safety/medical concerns         Wheelchair     Assist Is the patient using a wheelchair?: Yes Type of Wheelchair:  Manual    Wheelchair assist level: Supervision/Verbal cueing Max wheelchair distance: 150'    Wheelchair 50 feet with 2 turns activity    Assist        Assist Level: Supervision/Verbal cueing   Wheelchair 150 feet activity     Assist      Assist Level: Supervision/Verbal cueing   Blood pressure 116/73, pulse 72, temperature 98.4 F (36.9 C), temperature source Oral, resp. rate 19, height 6' (1.829 m), weight 71.9 kg, SpO2 99%.  Medical Problem List and Plan: 1. Functional deficits secondary to left BKA 01/30/2024 after failed TMA with history of PAD status post PCI to left tibia/fibula angioplasty              -patient may  shower             -ELOS/Goals: 7 days with mod I goals for mobility and self-care  Chart and therapy notes reviewed, continue CIR, discussed patient's progress with therapy Grounds pass ordered Vitamin D3/Metanx/Vitamin B/C complex ordered F/u  with me or Fidela in clinic within 1 month Would benefit from home health aide upon discharge   Should have cardiology follow-up within 1 week because cardiology medications have been changed due to hypotension  2.  Anemia: Hgb reviewed and is stable at 8.3, continue SQ heparin  q12H, stool occult ordered- asked nursing to please check             -antiplatelet therapy: Aspirin  81 mg daily, Plavix  75 mg daily   - 9-13: Hemoglobin 6.8 with dialysis today.  Getting transfusion, repeat H&H in AM.  Had small stool this morning, no noted blood.-->  Repleted to 9.1.  Repeat labs in a.m., nephrology adjusted Aranesp  on 9-11   3. Pain Management: decrease oxycodone  to 5mg  q6h given hypotension/fatigue Continue gabapentin   4. Insomnia: continue prn melatonin  5. Neuropsych/cognition: This patient is capable of making decisions on his own behalf.  -9/7pt appears clearer today. See above  6. Chole site irritation/yellow drainage: consulted IR - Recent acute cholecystitis June 2025.  Currently maintained on Augmentin  and linezolid  for 3 to 4 weeks and will confirm duration of antibiotic.  Radiology following  for percutaneous cholecystostomy tube for recent cholecystitis. Awaiting plan for continued suction drainage versus change to gravity bag. IR consulted since chole tube site appears irritated, reviewed their note- recommending to maintain drain, repeat imaging ordered by IR and they will follow patient again 9/15  7. Fluids/Electrolytes/Nutrition: Routine in and out with follow-up chemistries  8.  Aortic stenosis.  Status post TAVR 01/20/2024  9.  End-stage renal disease.  Continue hemodialysis as per renal services             -HD at end of day to allow participation in therapy  - HD T TH S  10.  Hyperlipidemia.  Lipitor  11.  Hypotension: coreg  decreased to HS, imdur  decreased by nephrology to 30mg  daily.  Monitor with increased mobility  - 9-13: Blood pressure appears improved with  decreased Imdur .    02/16/2024    4:08 AM 02/15/2024    7:37 PM 02/15/2024    2:48 PM  Vitals with BMI  Weight 158 lbs 8 oz    BMI 21.49    Systolic 116 121 887  Diastolic 73 72 58  Pulse 72 81 70   12.  Hypothyroidism.  continue Synthroid   13.  Acute on chronic anemia: see #2  14.  CAD with history of CABG.  No chest pain or shortness of  breath.  Continue Imdur   15.  BPH: d/c flomax  given hypotension, resolved  16.  Chronic diastolic congestive heart failure.  Monitor for any signs of fluid overload.  Follow-up per cardiology services.             -daily weights             -volume mgt with HD, weight reviewed and is stable Filed Weights   02/14/24 1829 02/15/24 0447 02/16/24 0408  Weight: 71.4 kg 71.5 kg 71.9 kg    17.  Constipation: Last BM documented 9/13, messaged nursing to ensure accuracy    LOS: 11 days A FACE TO FACE EVALUATION WAS PERFORMED  Sven SQUIBB Lajuana Patchell 02/16/2024, 11:37 AM

## 2024-02-16 NOTE — Progress Notes (Signed)
 Interventional Radiology Brief Note:  Patient known to IR for cholecystostomy tube placement in 11/2023, now in rehab with ongoing purulent drainage from tube with erythema.  CT Abdomen obtained yesterday shows mixed density fluid/sludge/stones in the fundus with well-placed cholecystostomy tube.  Patient continues with 0mL output from the tube.  Discussed with Dr. Philip.  Will plan to proceed with cholecystostomy tube exchange and upsize.   Patient not available for assessment this afternoon-- in rehab gym for afternoon session.  Will place orders for procedure tomorrow as schedule allows.   Phillip Swords, MS RD PA-C

## 2024-02-16 NOTE — Progress Notes (Signed)
 Occupational Therapy Session Note  Patient Details  Name: Phillip [name] MRN: [id number] Date of Birth: 12-20-1955  Session 1: Today's Date: 02/16/2024 OT Individual Time: 1002-1100 OT Individual Time Calculation (min): 58 min  Session 2: Today's Date: 02/16/2024 OT Individual Time: 1300-1330 OT Individual Time Calculation (min): 30 min   Short Term Goals: Week 2:  OT Short Term Goal 1 (Week 2): LTG=STG d/t ELOS  Skilled Therapeutic Interventions/Progress Updates:  Session 1: Skilled OT session completed to address functional transfers and wheelchair management. Pt received seated in WC, agreeable to participate in therapy. Pt reports no pain.  Pt declines self-care needs, completed with nursing staff. Pt self-propels to bathroom with Min A to push uphill. Pt positions WC independently to perform scoot pivot transfer. WC>BDABSC with close supervision to scoot. Pt declines need to void once positioned. Pt simulates clothing management in standing with Min A to reach standing requiring increased encouragement d/t fear of falling.   Pt self-propels 152ft Mod- I with 2 rest breaks. Pt completes the following UB exs with 5lb dowel for UB strengthening, and endurance: -3x10 shoulder press -3x10 chest press -3x10 bicep curls D/t increased fatigue, task discontinued. Pt requests to return back to room. Propels dependently back to room. OT instructs pt to manage WC leg rests in preparation for transfer. Pt able to don/doff leg rests with increased time and VC for positioning. WC>recliner lateral scoot transfer with close supervision. Once seated pt requests to void, OT encouraged transfer to bathroom to initiate clothing mgmt pt declines and requests urinal.   Pt seated in recliner with all needs in reach.   Session 2: Skilled OT session completed to address toileting hygiene and activity tolerance. Pt received seated in WC, agreeable to participate in therapy. Pt reports no pain upon  arrival, with activity pt reports phantom pain in residual limb. OT provides pain intervention by incorporating rest breaks and repositioning.   Pt requested to void, WC>BDABSC scoot pivot with CGA/close supervision. Pt encouraged to remove clothing seated at [facility], pt requests to stand to perform toileting hygiene. STS at [facility] performed with Min A to reach standing with grab bars and BDABSC. Max A for clothing mgmt in standing. OT provides education on transitioning to home environment and emphasis on increasing independence with toileting, pt verbalized understanding and expresses increase fear of falling when attempting to weight shift for clothing management. BDABSC>WC Min A scoot pivot to clear wheel. Pt instructed on 2x STS with increased encouragement. STS trial 1, CGA at RW VC to decrease trunk flexion once in standing, STS trial 2, Min A to reach standing. Pt sits unsafely d/t fatigue education provided on controlling landing by reaching back for WC. Pt verbalized understanding. Pt self-propelled 168ft Mod-I with one rest break.   Pt returned to room seated in [facility] with all needs met.   Therapy Documentation Precautions:  Precautions Precautions: Fall Recall of Precautions/Restrictions: Intact Precaution/Restrictions Comments: R chole drain, L BKA Required Braces or Orthoses: Other Brace Other Brace: limb guard Restrictions Weight Bearing Restrictions Per Provider Order: No LLE Weight Bearing Per Provider Order: Partial weight bearing   Therapy/Group: Individual Therapy  Alany Borman T Woods-Chance 02/16/2024, 7:58 AM

## 2024-02-16 NOTE — Progress Notes (Signed)
 Speech Language Pathology Daily Session Note  Patient Details  Name: Phillip [name] MRN: [id number] Date of Birth: March 14, 1956  Today's Date: 02/16/2024 SLP Individual Time: 0903-1001 SLP Individual Time Calculation (min): 58 min  Short Term Goals: Week 1: SLP Short Term Goal 1 (Week 1): STGs=LTGs d/t ELOS  Skilled Therapeutic Interventions:  Patient was seen in am to address cognitive re- training. Pt was alert and seated upright in WC upon SLP arrival. He was agreeable for session. Pt verbalized recent medical hx along with PLOF given min A. He endorsed recent changes in thinking and memory since surgery. SLP introduced WRAP compensatory strategies and examples of utilization. He was subsequently challenged in a paragraph retention task of moderate degree. SLP guided pt in use of repetition and association strategies with pt recalling information after a distracted 15 minute delay with 100% acc min A. In other minutes of session SLP addressing speech intelligibility through introduction of compensatory strategies loud voice and over articulation. Pt utilized strategies in informal conversation with speech intelligibility fluctuating between 90-100% in known context. SLP also addressing problem solving through challenging pt in sequencing 4 units of daily task which he completed mod I. He also identified solutions given related scenarios with 83% acc min A. Direct handoff to OT at conclusion of session. SLP to continue POC.   Pain Pain Assessment Pain Scale: 0-10 Pain Score: 8  Pain Location: Leg Pain Orientation: Left Pain Intervention(s): Medication (See eMAR)  Therapy/Group: Individual Therapy  Joane GORMAN [name] 02/16/2024, 9:56 AM

## 2024-02-16 NOTE — Plan of Care (Signed)
  Problem: RH BOWEL ELIMINATION Goal: RH STG MANAGE BOWEL WITH ASSISTANCE Description: STG Manage Bowel with mod I Assistance. Outcome: Progressing   Problem: RH SKIN INTEGRITY Goal: RH STG SKIN FREE OF INFECTION/BREAKDOWN Outcome: Progressing   Problem: RH SAFETY Goal: RH STG ADHERE TO SAFETY PRECAUTIONS W/ASSISTANCE/DEVICE Description: STG Adhere to Safety Precautions With mod I Assistance/Device. Outcome: Progressing   Problem: RH PAIN MANAGEMENT Goal: RH STG PAIN MANAGED AT OR BELOW PT'S PAIN GOAL Description: <4 w/ prns Outcome: Progressing

## 2024-02-17 ENCOUNTER — Other Ambulatory Visit (HOSPITAL_COMMUNITY)

## 2024-02-17 ENCOUNTER — Inpatient Hospital Stay (HOSPITAL_COMMUNITY)

## 2024-02-17 ENCOUNTER — Inpatient Hospital Stay (HOSPITAL_BASED_OUTPATIENT_CLINIC_OR_DEPARTMENT_OTHER)

## 2024-02-17 DIAGNOSIS — Z952 Presence of prosthetic heart valve: Secondary | ICD-10-CM

## 2024-02-17 HISTORY — PX: IR EXCHANGE BILIARY DRAIN: IMG6046

## 2024-02-17 LAB — ECHOCARDIOGRAM COMPLETE
AR max vel: 2.55 cm2
AV Area VTI: 2.62 cm2
AV Area mean vel: 2.19 cm2
AV Mean grad: 8 mmHg
AV Peak grad: 16 mmHg
Ao pk vel: 2 m/s
Area-P 1/2: 3.93 cm2
Calc EF: 42 %
Height: 72 in
MV M vel: 4.58 m/s
MV Peak grad: 83.9 mmHg
MV VTI: 2.29 cm2
S' Lateral: 5.8 cm
Single Plane A2C EF: 44.8 %
Single Plane A4C EF: 39.1 %
Weight: 2599.66 [oz_av]

## 2024-02-17 LAB — CBC
HCT: 23.8 % — ABNORMAL LOW (ref 39.0–52.0)
Hemoglobin: 7.6 g/dL — ABNORMAL LOW (ref 13.0–17.0)
MCH: 30.3 pg (ref 26.0–34.0)
MCHC: 31.9 g/dL (ref 30.0–36.0)
MCV: 94.8 fL (ref 80.0–100.0)
Platelets: 47 K/uL — ABNORMAL LOW (ref 150–400)
RBC: 2.51 MIL/uL — ABNORMAL LOW (ref 4.22–5.81)
RDW: 14.7 % (ref 11.5–15.5)
WBC: 4.9 K/uL (ref 4.0–10.5)
nRBC: 0 % (ref 0.0–0.2)

## 2024-02-17 LAB — HEPATITIS B SURFACE ANTIBODY, QUANTITATIVE: Hep B S AB Quant (Post): 51 m[IU]/mL

## 2024-02-17 LAB — RENAL FUNCTION PANEL
Albumin: 2.2 g/dL — ABNORMAL LOW (ref 3.5–5.0)
Anion gap: 8 (ref 5–15)
BUN: 57 mg/dL — ABNORMAL HIGH (ref 8–23)
CO2: 26 mmol/L (ref 22–32)
Calcium: 8.7 mg/dL — ABNORMAL LOW (ref 8.9–10.3)
Chloride: 98 mmol/L (ref 98–111)
Creatinine, Ser: 6.57 mg/dL — ABNORMAL HIGH (ref 0.61–1.24)
GFR, Estimated: 9 mL/min — ABNORMAL LOW (ref >60)
Glucose, Bld: 163 mg/dL — ABNORMAL HIGH (ref 70–99)
Phosphorus: 2.7 mg/dL (ref 2.5–4.6)
Potassium: 4.6 mmol/L (ref 3.5–5.1)
Sodium: 132 mmol/L — ABNORMAL LOW (ref 135–145)

## 2024-02-17 MED ORDER — SODIUM CHLORIDE 0.9% FLUSH
5.0000 mL | Freq: Three times a day (TID) | INTRAVENOUS | Status: DC
  Administered 2024-02-17 – 2024-02-23 (×18): 5 mL

## 2024-02-17 MED ORDER — FENTANYL CITRATE (PF) 100 MCG/2ML IJ SOLN
INTRAMUSCULAR | Status: AC | PRN
  Administered 2024-02-17: 25 ug via INTRAVENOUS

## 2024-02-17 MED ORDER — LIDOCAINE HCL 1 % IJ SOLN
20.0000 mL | Freq: Once | INTRAMUSCULAR | Status: AC
  Administered 2024-02-17: 10 mL
  Filled 2024-02-17: qty 20

## 2024-02-17 MED ORDER — FENTANYL CITRATE (PF) 100 MCG/2ML IJ SOLN
INTRAMUSCULAR | Status: AC
  Filled 2024-02-17: qty 2

## 2024-02-17 MED ORDER — MIDAZOLAM HCL 2 MG/2ML IJ SOLN
INTRAMUSCULAR | Status: AC | PRN
  Administered 2024-02-17: 1 mg via INTRAVENOUS

## 2024-02-17 MED ORDER — MIDAZOLAM HCL 2 MG/2ML IJ SOLN
INTRAMUSCULAR | Status: AC
  Filled 2024-02-17: qty 2

## 2024-02-17 MED ORDER — LIDOCAINE HCL 1 % IJ SOLN
INTRAMUSCULAR | Status: AC
  Filled 2024-02-17: qty 20

## 2024-02-17 MED ORDER — IOHEXOL 300 MG/ML  SOLN
50.0000 mL | Freq: Once | INTRAMUSCULAR | Status: AC | PRN
  Administered 2024-02-17: 15 mL

## 2024-02-17 NOTE — Plan of Care (Signed)
  Problem: Consults Goal: RH LIMB LOSS PATIENT EDUCATION Description: Description: See Patient Education module for eduction specifics. Outcome: Progressing   Problem: RH BOWEL ELIMINATION Goal: RH STG MANAGE BOWEL WITH ASSISTANCE Description: STG Manage Bowel with mod I Assistance. Outcome: Progressing   Problem: RH SKIN INTEGRITY Goal: RH STG SKIN FREE OF INFECTION/BREAKDOWN Outcome: Progressing   Problem: RH SAFETY Goal: RH STG ADHERE TO SAFETY PRECAUTIONS W/ASSISTANCE/DEVICE Description: STG Adhere to Safety Precautions With mod I Assistance/Device. Outcome: Progressing   Problem: RH PAIN MANAGEMENT Goal: RH STG PAIN MANAGED AT OR BELOW PT'S PAIN GOAL Description: <4 w/ prns Outcome: Progressing   Problem: RH KNOWLEDGE DEFICIT LIMB LOSS Goal: RH STG INCREASE KNOWLEDGE OF SELF CARE AFTER LIMB LOSS Description: Manage increase knowledge of self carte after limb loss with mod I assistance from wife using educational materials provided Outcome: Progressing   Problem: Education: Goal: Knowledge of General Education information will improve Description: Including pain rating scale, medication(s)/side effects and non-pharmacologic comfort measures Outcome: Progressing   Problem: Health Behavior/Discharge Planning: Goal: Ability to manage health-related needs will improve Outcome: Progressing   Problem: Clinical Measurements: Goal: Ability to maintain clinical measurements within normal limits will improve Outcome: Progressing Goal: Will remain free from infection Outcome: Progressing Goal: Diagnostic test results will improve Outcome: Progressing Goal: Respiratory complications will improve Outcome: Progressing Goal: Cardiovascular complication will be avoided Outcome: Progressing   Problem: Activity: Goal: Risk for activity intolerance will decrease Outcome: Progressing   Problem: Nutrition: Goal: Adequate nutrition will be maintained Outcome: Progressing    Problem: Coping: Goal: Level of anxiety will decrease Outcome: Progressing   Problem: Elimination: Goal: Will not experience complications related to bowel motility Outcome: Progressing Goal: Will not experience complications related to urinary retention Outcome: Progressing   Problem: Pain Managment: Goal: General experience of comfort will improve and/or be controlled Outcome: Progressing   Problem: Safety: Goal: Ability to remain free from injury will improve Outcome: Progressing   Problem: Skin Integrity: Goal: Risk for impaired skin integrity will decrease Outcome: Progressing

## 2024-02-17 NOTE — Progress Notes (Signed)
 Speech Language Pathology Daily Session Note  Patient Details  Name: YON SCHIFFMAN MRN: [id number] Date of Birth: 01/20/1956  Today's Date: 02/17/2024 SLP Individual Time: 9269-9187 SLP Individual Time Calculation (min): 42 min and  SLP Missed Time: 18 Minutes Missed Time Reason:  (IR Procedure)  Short Term Goals: Week 1: SLP Short Term Goal 1 (Week 1): STGs=LTGs d/t ELOS  Skilled Therapeutic Interventions:   SLP conducted skilled therapy session targeting cognition goals. Upon clinician entry, patient resistant to speech therapy. SLP educated patient about the importance of speech therapy and working toward strengthening cognition. Through persistence, patient eventually agreed to participate. Remaining session used to target organization and memory strategies through calendar event organization task. Patient organized 10/10 items with minA to supervision. Patient missed last 18 minutes of session due to arrival of transport to take patient to IR procedure. Patient was left in room with call bell in reach and alarm set. SLP will continue to target goals per plan of care.     Pain Pain Assessment Pain Scale: 0-10 Pain Score: Asleep Pain Location: Leg Pain Intervention(s): Medication (See eMAR);Pain med given for lower pain score than stated, per patient request;Rest;Massage  Therapy/Group: Individual Therapy  Ashley A Ellin 02/17/2024, 8:22 AM

## 2024-02-17 NOTE — Progress Notes (Signed)
  Received patient in bed to unit.   Informed consent signed and in chart.    TX duration:3      Transported by  Hand-off given to patient's nurse.    Access used: left AVF Access issues:    Total UF removed: 1.9 Medication(s) given: none Post HD VS:  133/54    Hunter Hacking LPN [facility]

## 2024-02-17 NOTE — Procedures (Signed)
 Interventional Radiology Procedure Note  Procedure: FLUORO EXCHG AND UPSIZE OF THE CHOLECYSTOSTOMY    Complications: None  Estimated Blood Loss:  0  Findings: PURULENT DEBRIS FILLED BILE 12FR DRAIN CX SENT     EMERSON FREDERIC SPECKING, MD

## 2024-02-17 NOTE — Progress Notes (Signed)
 Referring Physician(s): Dr. Odell Castor  Supervising Physician: Vanice Revel  Patient Status:  [facility] - In-pt  Chief Complaint: Acute cholecystitis s/p cholecystostomy tube placement 11/12/23 with exchange on 9/2; tube is now malfunctioning.    Subjective: Patient currently admitted to rehab unit and is sitting on the edge of the bed working with Speech Therapy. He denies any pain or discomfort.  Brief History:   68 year old male with a history of ESRD on HD, complicated cardiac history, and GERD who presented to the ED in June with complaints of acute abdominal pain after being found febrile at dialysis. Due to significant cardiac history and ESRD, patient was not deemed a surgical candidate and was referred to IR for perc chole placement which he underwent on 6/11. He followed up with Surgery on 8/11 who recommended no consideration for cholecystectomy until after his TAVR. Unfortunately, he ended up being admitted to the hospital on 8/13 following amputation of his left great toe. During his recovery, he was noted to have purulent drainage from the insertion site of his chole drain. The drain was subsequently exchanged on 9/2 in IR by Dr. Jennefer.     CT Abdomen obtained yesterday shows mixed density fluid/sludge/stones in the fundus with well-placed cholecystostomy tube. Patient has had minimal output since the drain exchange. Dr. Henn approved patient for an image-guided exchanged with upsize.   Allergies: Patient has no known allergies.  Medications: Prior to Admission medications   Medication Sig Start Date End Date Taking? Authorizing Provider  acetaminophen  (TYLENOL ) 500 MG tablet Take 1,000 mg by mouth every 6 (six) hours as needed for moderate pain or headache.     [provider]  amoxicillin -clavulanate (AUGMENTIN ) 500-125 MG tablet Take 1 tablet by mouth 2 (two) times daily. 02/05/24   Mcarthur Pick, MD  aspirin  EC 81 MG tablet Take 1 tablet (81 mg total) by mouth  daily. Swallow whole. 07/25/23   Thukkani, Arun K, MD  atorvastatin  (LIPITOR) 20 MG tablet Take 1 tablet (20 mg total) by mouth daily. 07/25/23   Thukkani, Arun K, MD  bisacodyl  (DULCOLAX) 5 MG EC tablet Take 1 tablet (5 mg total) by mouth daily as needed for moderate constipation. 02/05/24   Sigdel, Santosh, MD  carvedilol  (COREG ) 3.125 MG tablet Take 1 tablet (3.125 mg total) by mouth 2 (two) times daily. 05/20/23 05/02/24  Carlin Delon BROCKS, NP  clopidogrel  (PLAVIX ) 75 MG tablet Take 1 tablet (75 mg total) by mouth daily. 12/26/23   Marea Selinda RAMAN, MD  gabapentin  (NEURONTIN ) 300 MG capsule Take 1 capsule (300 mg total) by mouth at bedtime. Patient taking differently: Take 600 mg by mouth 2 (two) times daily. 08/25/23   Arrien, Mauricio Daniel, MD  insulin  glargine (LANTUS ) 100 UNIT/ML injection Inject 0.07 mLs (7 Units total) into the skin at bedtime. Inject 7 units at bedtime as needed after checking glucose 02/05/24   Sigdel, Santosh, MD  isosorbide  mononitrate (IMDUR ) 120 MG 24 hr tablet Take 1 tablet (120 mg total) by mouth daily. 08/27/23   Revankar, Jennifer SAUNDERS, MD  latanoprost  (XALATAN ) 0.005 % ophthalmic solution Place 1 drop into both eyes at bedtime. 04/15/23   [provider]  levothyroxine  (SYNTHROID ) 50 MCG tablet Take 50 mcg by mouth daily. 04/05/23   [provider]  linezolid  (ZYVOX ) 600 MG tablet Take 1 tablet (600 mg total) by mouth every 12 (twelve) hours. 02/05/24   Sigdel, Santosh, MD  Methoxy PEG-Epoetin Beta (MIRCERA IJ) 30 mcg. 12/16/23 12/14/24  [provider]  nitroGLYCERIN  (NITROSTAT ) 0.4 MG SL tablet Place 1 tablet (0.4 mg total) under the tongue every 5 (five) minutes as needed for chest pain. 11/20/20   Revankar, Rajan R, MD  sevelamer  carbonate (RENVELA ) 800 MG tablet Take 800 mg by mouth 3 (three) times daily with meals. 09/04/23 11/29/24  [provider]  sodium chloride  flush (NS) 0.9 % SOLN Inject 5 mLs into the vein daily for 15 days. flush drain  daily with 5 cc NS, record output daily, dressing changes every 2-3 days or earlier if soiled 11/14/23 11/29/23  Darci Pore, MD  sodium chloride  flush 0.9 % SOLN injection 10 mLs by Intracatheter route every 3 (three) days. 01/02/24 04/01/24  Zamiyah Resendes R, NP  tamsulosin  (FLOMAX ) 0.4 MG CAPS capsule Take 0.4 mg by mouth daily after breakfast. 04/19/20   [provider]  traMADol  (ULTRAM ) 50 MG tablet Take 50 mg by mouth every 8 (eight) hours as needed. 09/15/23   [provider]     Vital Signs: BP 116/66 (BP Location: Right Arm)   Pulse 66   Temp 98.7 F (37.1 C) (Oral)   Resp 16   Ht 6' (1.829 m)   Wt 162 lb 7.7 oz (73.7 kg)   SpO2 100%   BMI 22.04 kg/m   Physical Exam Constitutional:      General: He is not in acute distress.    Appearance: He is not ill-appearing.  HENT:     Mouth/Throat:     Mouth: Mucous membranes are moist.     Pharynx: Oropharynx is clear.  Pulmonary:     Effort: Pulmonary effort is normal.  Abdominal:     Tenderness: There is no abdominal tenderness.     Comments: RUQ with purulent drainage at the site. Site is non-tender.   Skin:    General: Skin is warm and dry.  Neurological:     Mental Status: He is alert and oriented to person, place, and time.       Labs:  CBC: Recent Labs    02/10/24 0444 02/11/24 1853 02/14/24 1438 02/15/24 0542 02/16/24 0603  WBC 5.3 4.9 4.7  --  4.4  HGB 8.3* 8.3* 6.8* 9.1* 8.3*  HCT 25.3* 26.8* 22.1* 28.2* 26.1*  PLT 83* 74* 38*  --  34*    COAGS: Recent Labs    11/11/23 1930 11/12/23 1011 01/19/24 1844 02/09/24 0515  INR 1.4* 1.3* 1.1 1.1  APTT  --   --  39*  --     BMP: Recent Labs    02/09/24 0515 02/10/24 0444 02/12/24 1217 02/14/24 1430  NA 129* 129* 131* 129*  K 5.2* 5.2* 4.6 4.4  CL 91* 89* 93* 92*  CO2 25 23 26 27   GLUCOSE 110* 91 131* 113*  BUN 69* 95* 66* 57*  CALCIUM  8.2* 8.0* 8.5* 8.3*  CREATININE 8.10* 9.37* 7.40* 5.56*  GFRNONAA 7* 6* 7*  10*    LIVER FUNCTION TESTS: Recent Labs    11/13/23 0821 01/15/24 0338 01/19/24 1844 01/26/24 0328 02/03/24 1630 02/09/24 0515 02/10/24 0444 02/12/24 1217 02/14/24 1430  BILITOT 2.4* 0.8 0.8 0.8  --   --   --   --   --   AST 187* 19 22 17   --   --   --   --   --   ALT 97* 11 <5 <5  --   --   --   --   --   ALKPHOS 123 142* 161* 96  --   --   --   --   --  PROT 8.1 7.6 9.4* 8.4*  --   --   --   --   --   ALBUMIN  2.7* 2.1* 2.5* 2.4*   < > 2.2* 2.2* 2.3* 2.1*   < > = values in this interval not displayed.    Assessment and Plan:  Acute cholecystitis s/p percutaneous cholecystostomy 11/12/23 with exchange 9/2; malfunctioning drain: Phillip [name], 68 year old male, is scheduled today for an image-guided percutaneous cholecystostomy exchange with upsize. Procedure will be performed under moderate sedation.   Risks and benefits were discussed with the patient including, but not limited to, bleeding, infection, gallbladder perforation, bile leak, sepsis or even death.  All of the patient's questions were answered, patient is agreeable to proceed. He has been NPO.   Consent signed and in IR.   Electronically Signed: Warren Dais, AGACNP-BC 02/17/2024, 8:06 AM   I spent a total of 15 Minutes at the the patient's bedside AND on the patient's hospital floor or unit, greater than 50% of which was counseling/coordinating care for acute cholecystitis.

## 2024-02-17 NOTE — Progress Notes (Signed)
 Physical Therapy Session Note  Patient Details  Name: Phillip [name] MRN: [id number] Date of Birth: 27-Oct-1955  Today's Date: 02/17/2024 PT Individual Time: 8968-8958 PT Individual Time Calculation (min): 10 min  Today's Date: 02/17/2024 PT Missed Time: 65 Minutes Missed Time Reason: Unavailable (Comment) (off unit)  Short Term Goals: Week 1:  PT Short Term Goal 1 (Week 1): Pt will perform basic transfers with min A PT Short Term Goal 1 - Progress (Week 1): Met PT Short Term Goal 2 (Week 1): Pt will be able to perform sit <> stands with min A PT Short Term Goal 2 - Progress (Week 1): Progressing toward goal PT Short Term Goal 3 (Week 1): Pt will be able to initiate gait PT Short Term Goal 3 - Progress (Week 1): Progressing toward goal Week 2:  PT Short Term Goal 1 (Week 2): STG=LTG due to LOS  Skilled Therapeutic Interventions/Progress Updates:   Attempted to see pt for scheduled PT treatment, however pt off unit for image-guided percutaneous cholecystostomy exchange. 75 minutes missed of skilled physical therapy. Will attempt to make up missed time as pt becomes available.   Received pt sitting EOB, just returned from procedure. Pt agreeable to short treatment to make up missed time before OT arrived and reported just receiving pain medication. Removed shrinker and limb wrapped in gauze - removed gauze and donned 4XL shrinker directly over incision with total A. Pt with good recall of education stating I thought the shrinker was supposed to go directly on it - therapist confirmed this is correct and encouraged pt to advocate for himself and his care. Donned limb guard with supervision and R sock and shoe with max A. Pt transferred bed<>WC via squat<>pivot with min A and cues for technique. Concluded session with pt sitting in [facility] with all needs within reach awaiting upcoming OT session. Made up 10 minutes of missed 75 minutes.  Therapy Documentation Precautions:   Precautions Precautions: Fall Recall of Precautions/Restrictions: Intact Precaution/Restrictions Comments: R chole drain, L BKA Required Braces or Orthoses: Other Brace Other Brace: limb guard Restrictions Weight Bearing Restrictions Per Provider Order: No LLE Weight Bearing Per Provider Order: Partial weight bearing  Therapy/Group: Individual Therapy [name] Therisa Stains PT, DPT 02/17/2024, 6:54 AM

## 2024-02-17 NOTE — Progress Notes (Signed)
 Occupational Therapy Session Note  Patient Details  Name: Phillip [name] MRN: [id number] Date of Birth: 1955/11/01  Today's Date: 02/17/2024 OT Individual Time: 8954-8854 OT Individual Time Calculation (min): 60 min    Short Term Goals: Week 2:  OT Short Term Goal 1 (Week 2): LTG=STG d/t ELOS  Skilled Therapeutic Interventions/Progress Updates:  Skilled OT session completed to address ADL retraining and functional mobility. Pt received seated in WC, agreeable to participate in therapy. Pt reports no pain, but lethargic from radiology procedure this date.   Pt attempts 3x STS with Max A at RW, unable to reach standing d/t fatigue. Pt self-propelled 17ft to // bars to attempt 3x STS, pt reaches standing with Min A, VC to decrease trunk flexion in standing. In standing, pt encouraged to support stand with 1 UE and reach at posterior region to simulate toileting hygiene, pt completes with close supervision d/t fear of falling no LOB noted. Pt required 1 seated rest break during activity. WC>EOM scoot pivot transfer with close supervision and VC to raise bottom. Pt performs L>R lateral scoot EOM 2x, VC to lift bottom off mat surface to simulate movement for functional transfers. Pt instructed on simulated seated toileting hygiene and clothing management to reduce risk for falls in standing and increase independence with toileting. Pt provided with red theraband to suiimulate LB clothing; pt don/doffs Mod-I seated for extra time when performing LB weight shifting. 10 rings placed at posterior region to simulate hygiene, pt able to remove 10/10 rings independently. WC>EOM lateral scoot transfer with supervision. Pt manages WC arms and legs with independence prior to transfer. Pt propelled to room dependently.   Pt requested to void, WC>BDABSC lateral scoot CGA to position WC at safe distance. Pt doffs LB clothing for toileting Mod-I seated. Dons clothing with A d/t time nursing present to transfer for  procedure. BDABSC>WC lateral scoot CGA. Pt seated in [facility] with nursing present and all needs met.  Therapy Documentation Precautions:  Precautions Precautions: Fall Recall of Precautions/Restrictions: Intact Precaution/Restrictions Comments: R chole drain, L BKA Required Braces or Orthoses: Other Brace Other Brace: limb guard Restrictions Weight Bearing Restrictions Per Provider Order: No LLE Weight Bearing Per Provider Order: Partial weight bearing   Therapy/Group: Individual Therapy  Ellianah Cordy Woods-Chance, MS, OTR/L 02/17/2024, 12:05 PM

## 2024-02-17 NOTE — Progress Notes (Signed)
 [facility] Progress Note   Subjective:  Seen in room. S/p cholecystostomy tube exchange this AM. For HD later today. No CP/dyspnea.  Objective Vitals:   02/17/24 0925 02/17/24 0930 02/17/24 0945 02/17/24 1005  BP: (!) 121/51 (!) 123/46 125/70 (!) 132/45  Pulse: 68 66 72 67  Resp: 12 13 17 16   Temp:    98.6 F (37 C)  TempSrc:      SpO2: 100% 100% 100% 100%  Weight:      Height:       Physical Exam General: Well appearing man, NAD. Room air Heart: RRR Lungs: CTAB Abdomen: soft, chole tube in place Extremities: Trace LE edema Dialysis Access:  LUE AVF +t/b  Additional Objective Labs: Basic Metabolic Panel: Recent Labs  Lab 02/12/24 1217 02/14/24 1430  NA 131* 129*  K 4.6 4.4  CL 93* 92*  CO2 26 27  GLUCOSE 131* 113*  BUN 66* 57*  CREATININE 7.40* 5.56*  CALCIUM  8.5* 8.3*  PHOS 4.1 3.1   Liver Function Tests: Recent Labs  Lab 02/12/24 1217 02/14/24 1430  ALBUMIN  2.3* 2.1*   CBC: Recent Labs  Lab 02/11/24 1853 02/14/24 1438 02/15/24 0542 02/16/24 0603  WBC 4.9 4.7  --  4.4  NEUTROABS 3.4  --   --   --   HGB 8.3* 6.8* 9.1* 8.3*  HCT 26.8* 22.1* 28.2* 26.1*  MCV 96.4 96.9  --  94.9  PLT 74* 38*  --  34*   Studies/Results: IR EXCHANGE BILIARY DRAIN Result Date: 02/17/2024 INDICATION: Occluded cholecystostomy EXAM: FLUOROSCOPIC EXCHANGE AND UPSIZE OF THE EXISTING CHOLECYSTOSTOMY MEDICATIONS: 1% LIDOCAINE  LOCAL ANESTHESIA/SEDATION: Moderate (conscious) sedation was employed during this procedure. A total of Versed  1.0 mg and Fentanyl  25 mcg was administered intravenously by the radiology nurse. Total intra-service moderate Sedation Time: 10 minutes. The patient's level of consciousness and vital signs were monitored continuously by radiology nursing throughout the procedure under my direct supervision. FLUOROSCOPY: Radiation Exposure Index (as provided by the fluoroscopic device): 7.0 mGy Kerma COMPLICATIONS: None immediate. PROCEDURE:  Informed written consent was obtained from the patient after a thorough discussion of the procedural risks, benefits and alternatives. All questions were addressed. Maximal Sterile Barrier Technique was utilized including caps, mask, sterile gowns, sterile gloves, sterile drape, hand hygiene and skin antiseptic. A timeout was performed prior to the initiation of the procedure. Under sterile conditions and local anesthesia attempts were made to remove the occluded catheter. This was unsuccessful. Catheter was cut and removed. Percutaneous tract was recannulated with a Kumpe catheter. Catheter advanced into the gallbladder. Contrast injection confirms gallbladder packed with stones. Cystic duct occluded. Amplatz guidewire inserted. Tract dilatation performed to insert a 12 French cholecystostomy. Retention loop formed in the gallbladder. Contrast injection confirms position. Images obtained for documentation. Catheter secured with a silk suture and a sterile dressing. Gravity drainage bag connected. IMPRESSION: Successful fluoroscopic exchange and upsize of the percutaneous cholecystostomy Electronically Signed   By: CHRISTELLA.  Shick M.D.   On: 02/17/2024 12:10   CT ABDOMEN PELVIS WO CONTRAST Result Date: 02/15/2024 CLINICAL DATA:  Cholecystostomy tube not flushing. EXAM: CT ABDOMEN AND PELVIS WITHOUT CONTRAST TECHNIQUE: Multidetector CT imaging of the abdomen and pelvis was performed following the standard protocol without IV contrast. RADIATION DOSE REDUCTION: This exam was performed according to the departmental dose-optimization program which includes automated exposure control, adjustment of the mA and/or kV according to patient size and/or use of iterative reconstruction technique. COMPARISON:  CT abdomen pelvis dated 02/05/2024. FINDINGS: Evaluation of  this exam is limited in the absence of intravenous contrast. Lower chest: The visualized lung bases are clear. Aortic valve repair. Coronary vascular calcification.  No intra-abdominal free air or free fluid. Hepatobiliary: The liver is unremarkable. No BD dilatation. High attenuating content within the gallbladder likely combination of sludge, stones, and injected contrast. There is diffuse thickened and hazy appearance of the gallbladder wall likely related to chronic inflammation. Percutaneous cholecystostomy with pigtail tip in the gallbladder fundus. No extraluminal fluid collection noted. Pancreas: Unremarkable. No pancreatic ductal dilatation or surrounding inflammatory changes. Spleen: Normal in size without focal abnormality. Adrenals/Urinary Tract: The adrenal glands unremarkable. There is no hydronephrosis or nephrolithiasis on either side. The visualized ureters and urinary bladder appear unremarkable. Stomach/Bowel: There is moderate stool throughout the colon. There is no bowel obstruction or active inflammation. Appendectomy. Vascular/Lymphatic: Moderate aortoiliac atherosclerotic disease. The IVC is unremarkable. No portal venous gas. There is no adenopathy. Reproductive: The prostate and seminal vesicles are grossly remarkable. Other: There is skin thickening and subcutaneous stranding of the left anterior abdominal wall may be related to contusion. No fluid collection or hematoma. Musculoskeletal: Degenerative changes of the spine. No acute osseous pathology. Bilateral L5 pars defects with grade 1 anterolisthesis. IMPRESSION: 1. Percutaneous cholecystostomy with pigtail tip in the gallbladder fundus. 2. No hydronephrosis or nephrolithiasis. 3. No bowel obstruction. 4.  Aortic Atherosclerosis (ICD10-I70.0). Electronically Signed   By: Vanetta Chou M.D.   On: 02/15/2024 19:17   Medications:   (feeding supplement) PROSource Plus  30 mL Oral BID BM   sodium chloride    Intravenous Once   amoxicillin -clavulanate  1 tablet Oral QHS   aspirin  EC  81 mg Oral Daily   atorvastatin   20 mg Oral Daily   B-complex with vitamin C  1 tablet Oral Daily   carvedilol    3.125 mg Oral QHS   Chlorhexidine  Gluconate Cloth  6 each Topical Q0600   cholecalciferol   1,000 Units Oral Daily   clopidogrel   75 mg Oral Daily   darbepoetin (ARANESP ) injection - DIALYSIS  100 mcg Subcutaneous Weekly   docusate sodium   100 mg Oral Daily   doxycycline   100 mg Oral Q12H   gabapentin   200 mg Oral QHS   heparin  injection (subcutaneous)  5,000 Units Subcutaneous Q12H   isosorbide  mononitrate  30 mg Oral Daily   l-methylfolate-B6-B12  1 tablet Oral Daily   latanoprost   1 drop Both Eyes QHS   levothyroxine   50 mcg Oral Daily   nutrition supplement (JUVEN)  1 packet Oral BID BM   sevelamer  carbonate  800 mg Oral TID WC   sodium chloride  flush  5 mL Intracatheter Q8H    Dialysis Orders [facility]TS [facility] 4hr, 350/800, EDW 81kg, 2K/2.5Ca, L AVF, no heparin  - no VDRA, ESA ordered but not yet given   Assessment/Plan: 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 Per [facility].  In CIR. Severe aortic stenosis s/p TAVR 01/20/24: Per PMD.  ESRD: HD TTS - for HD today. Vascular Access: S/p AVF infiltration and difficulty cannulating at his outpatient HD center. F'gram completed 9/8 - normal/patent AVF.  Needs expert cannulator with every treatment.   HTN/volume: BP in goal. Continue carvedilol  3.125mg  BID and Imdur  30mg  every day. S/p amputation, will need lower edw on discharge.  Anemia of ESRD: Hgb 8.3. S/p 1U PRBCs on 9/13. Continue Aranesp  100mcg q Friday. Secondary HPTH: CorrCa ok and phos ok- continue sevelamer  as binder. Nutrition: Alb low,  continue supps. Hx cholecystitis with perc chole drain still in place: Erythema/purulence around drain. S/p tube exchange 9/2. Remains on Augmentin . Repeat CT 9/14 without abscess or other changes to area, s/p exchange to larger tube today. T2DM Dispo: In CIR   Izetta Boehringer, PA-C 02/17/2024, 1:06 PM  [employer]

## 2024-02-17 NOTE — Progress Notes (Signed)
 Unable to see patient today as he was in procedure for upsizing of cholecystostomy. Returned to floor and cleared for regular diet later in the morning. Communicated with Lamarr Hummer PA and there is plan for Echo prior to d/c

## 2024-02-18 ENCOUNTER — Other Ambulatory Visit (HOSPITAL_COMMUNITY): Payer: Self-pay

## 2024-02-18 LAB — TYPE AND SCREEN
ABO/RH(D): O POS
Antibody Screen: NEGATIVE

## 2024-02-18 LAB — HEMOGLOBIN AND HEMATOCRIT, BLOOD
HCT: 28.1 % — ABNORMAL LOW (ref 39.0–52.0)
Hemoglobin: 8.8 g/dL — ABNORMAL LOW (ref 13.0–17.0)

## 2024-02-18 LAB — PREPARE RBC (CROSSMATCH)

## 2024-02-18 LAB — OCCULT BLOOD X 1 CARD TO LAB, STOOL: Fecal Occult Bld: NEGATIVE

## 2024-02-18 MED ORDER — SODIUM CHLORIDE FLUSH 0.9 % IV SOLN
10.0000 mL | Freq: Every day | INTRAVENOUS | 1 refills | Status: DC
  Filled 2024-02-18: qty 300, 30d supply, fill #0

## 2024-02-18 MED ORDER — OXYCODONE HCL 5 MG PO TABS
5.0000 mg | ORAL_TABLET | Freq: Three times a day (TID) | ORAL | Status: DC | PRN
  Administered 2024-02-18 (×2): 5 mg via ORAL
  Filled 2024-02-18 (×2): qty 1

## 2024-02-18 MED ORDER — SODIUM CHLORIDE 0.9% IV SOLUTION: Freq: Once | INTRAVENOUS | Status: DC

## 2024-02-18 MED ORDER — HEPARIN SODIUM (PORCINE) 5000 UNIT/ML IJ SOLN
5000.0000 [IU] | Freq: Every day | INTRAMUSCULAR | Status: DC
  Filled 2024-02-18: qty 1

## 2024-02-18 NOTE — Plan of Care (Signed)
  Problem: RH Ambulation Goal: LTG Patient will ambulate in controlled environment (PT) Description: LTG: Patient will ambulate in a controlled environment, # of feet with assistance (PT). Outcome: Not Applicable Flowsheets (Taken 02/18/2024 1142) LTG: Pt will ambulate in controlled environ  assist needed:: (D/C) -- Note: D/C Goal: LTG Patient will ambulate in home environment (PT) Description: LTG: Patient will ambulate in home environment, # of feet with assistance (PT). Outcome: Not Applicable Flowsheets (Taken 02/18/2024 1142) LTG: Pt will ambulate in home environ  assist needed:: (D/C) -- Note: D/C

## 2024-02-18 NOTE — Plan of Care (Signed)
  Problem: Consults Goal: RH LIMB LOSS PATIENT EDUCATION Description: Description: See Patient Education module for eduction specifics. Outcome: Progressing Goal: Skin Care Protocol Initiated - if Braden Score 18 or less Description: If consults are not indicated, leave blank or document N/A Outcome: Progressing Goal: Nutrition Consult-if indicated Outcome: Progressing Goal: Diabetes Guidelines if Diabetic/Glucose > 140 Description: If diabetic or lab glucose is > 140 mg/dl - Initiate Diabetes/Hyperglycemia Guidelines & Document Interventions  Outcome: Progressing   Problem: RH BOWEL ELIMINATION Goal: RH STG MANAGE BOWEL WITH ASSISTANCE Description: STG Manage Bowel with Assistance. Outcome: Progressing Goal: RH STG MANAGE BOWEL W/MEDICATION W/ASSISTANCE Description: STG Manage Bowel with Medication with Assistance. Outcome: Progressing   Problem: RH BLADDER ELIMINATION Goal: RH STG MANAGE BLADDER WITH ASSISTANCE Description: STG Manage Bladder With Assistance Outcome: Progressing Goal: RH STG MANAGE BLADDER WITH MEDICATION WITH ASSISTANCE Description: STG Manage Bladder With Medication With Assistance. Outcome: Progressing Goal: RH STG MANAGE BLADDER WITH EQUIPMENT WITH ASSISTANCE Description: STG Manage Bladder With Equipment With Assistance Outcome: Progressing   Problem: RH SKIN INTEGRITY Goal: RH STG SKIN FREE OF INFECTION/BREAKDOWN Outcome: Progressing Goal: RH STG MAINTAIN SKIN INTEGRITY WITH ASSISTANCE Description: STG Maintain Skin Integrity With Assistance. Outcome: Progressing Goal: RH STG ABLE TO PERFORM INCISION/WOUND CARE W/ASSISTANCE Description: STG Able To Perform Incision/Wound Care With Assistance. Outcome: Progressing   Problem: RH SAFETY Goal: RH STG ADHERE TO SAFETY PRECAUTIONS W/ASSISTANCE/DEVICE Description: STG Adhere to Safety Precautions With Assistance/Device. Outcome: Progressing Goal: RH STG DECREASED RISK OF FALL WITH  ASSISTANCE Description: STG Decreased Risk of Fall With Assistance. Outcome: Progressing   Problem: RH PAIN MANAGEMENT Goal: RH STG PAIN MANAGED AT OR BELOW PT'S PAIN GOAL Outcome: Progressing   Problem: RH KNOWLEDGE DEFICIT LIMB LOSS Goal: RH STG INCREASE KNOWLEDGE OF SELF CARE AFTER LIMB LOSS Outcome: Progressing   Problem: RH Vision Goal: RH LTG Vision (Specify) Outcome: Progressing   Problem: RH Pre-functional/Other (Specify) Goal: RH LTG Pre-functional (Specify) Outcome: Progressing Goal: RH LTG Interdisciplinary (Specify) 1 Description: RH LTG Interdisciplinary (Specify)1 Outcome: Progressing Goal: RH LTG Interdisciplinary (Specify) 2 Description: RH LTG Interdisciplinary (Specify) 2 Outcome: Progressing

## 2024-02-18 NOTE — Progress Notes (Signed)
 Physical Therapy Session Note  Patient Details  Name: Phillip [name] MRN: [id number] Date of Birth: 05/12/1956  Today's Date: 02/18/2024 PT Individual Time: [phone] and [id number] PT Individual Time Calculation (min): 103 min and 54 min  Short Term Goals: Week 1:  PT Short Term Goal 1 (Week 1): Pt will perform basic transfers with min A PT Short Term Goal 1 - Progress (Week 1): Met PT Short Term Goal 2 (Week 1): Pt will be able to perform sit <> stands with min A PT Short Term Goal 2 - Progress (Week 1): Progressing toward goal PT Short Term Goal 3 (Week 1): Pt will be able to initiate gait PT Short Term Goal 3 - Progress (Week 1): Progressing toward goal Week 2:  PT Short Term Goal 1 (Week 2): STG=LTG due to LOS  Skilled Therapeutic Interventions/Progress Updates:   Treatment Session 1 Received pt sitting EOB, pt agreeable to PT treatment, and reported pain 9/10 in L residual limb (premedicated). Session with emphasis on functional mobility/transfers, generalized strengthening and endurance, dynamic standing balance/coordination, and simulated car transfers. Donned R shoe with max A, then removed dirty shrinker without assist - noted incision to be healing well. Donned clean 2XL shrinker with total A and pt donned limb guard with supervision and increased time. Stood from elevated EOB with RW and max A fading to mod A x 3 trials for active warm up - cues to scoot to edge of bed, for anterior weight shifting, and upright posture/gaze. Of note, pt prefers to stand with BUE support on RW. Then performed squat<>pivot to L with min A (noted poor buttocks clearance and required multiple small scoots).   Pt sat in WC at sink and cleaned shrinker with supervision and cues for technique and therapist laid to air dry. Pt brushed teeth/washed face with setup assist, then performed WC mobility 166ft using BUE and supervision/mod I with emphasis on UE strength/coordination. Pt instructed in doffing  legrests with min A, and able to setup car transfer with min cues for WC positioning. Pt performed simulated car transfer with close supervision and assist to place/remove board. Discussed pt's ramp and pt reports needing to confirm with wife if ramp has been installed yet. Pt transferred on/off Nustep via slideboard with CGA and assist to place/remove board. Pt performed seated BUE and RLE strengthening on workload 3 for 8 minutes with x1 rest break with emphasis on cardiovascular endurance - total of 268 steps, 0.1 miles, 32 SPM, and 1.4 METs.   Transported to main gym and performed lateral scoot on/off mat with CGA. Pt performed the following seated exercises with emphasis on LE and core strength/ROM: -knee extensions 2x20 bilaterally with 1.5lb ankle weight on RLE -hip flexion 2x15 bilaterally with 1.5lb ankle weight on RLE -hip adduction ball squeezes 2x10 with 10 second isometric hold -hip abduction with grn TB 2x20 -R hamstring curls with grn TB 2x15 -shoulder extensions into physioball 2x10 with 5 second hold MD arrived for morning rounds. Returned to room and concluded session with pt sitting in [facility] with all needs within reach awaiting upcoming SLP session. Pt informed therapist at end of session that ramp has not been started yet and pt has no idea when it will be.  Treatment Session 2 Received pt sitting in [facility] with wife present for family education training, RN flushing tube, and nutrition services taking meal orders. Pt agreeable to PT treatment and appeared to be having phantom pain spasms throughout. Session with emphasis on discharge planning,  generalized strengthening and endurance, and simulated car transfers. Pt transported to ortho gym in [facility] dependently for time management purposes. Pt removed legrests with min A provided by wife, then setup transfer to car with min cues for WC positioning. Pt performed simulated car transfer using slideboard with close supervision with assist from wife  to [employer].   Discussed with pt/wife the following: -being at [facility] level with no standing or ambulating at home -donning/doffing legrests -2 STE home and need for ramp (current plan to receive ambulance transport home but will require ramp for trips to/from dialysis) -setting up transportation services (but will still need ramp as transport will not bump pt up/down steps). -provided pt/wife with printout of ramp resource Pt's wife reports she currently has no money to purchase $165 ramp. Explained that coming up with the money for temporary ramp will save them money in the long term from ambulance and transportation service bills. Suggested asking friends/family to donate money and discussed concerns with CSW and MD. Returned to room and concluded session with pt sitting in [facility] with all needs within reach.   Therapy Documentation Precautions:  Precautions Precautions: Fall Recall of Precautions/Restrictions: Intact Precaution/Restrictions Comments: R chole drain, L BKA Required Braces or Orthoses: Other Brace Other Brace: limb guard Restrictions Weight Bearing Restrictions Per Provider Order: No LLE Weight Bearing Per Provider Order: Partial weight bearing  Therapy/Group: Individual Therapy [name] Therisa Stains PT, DPT 02/18/2024, 7:01 AM

## 2024-02-18 NOTE — Progress Notes (Signed)
 Occupational Therapy Note  Patient Details  Name: Phillip [name] MRN: [id number] Date of Birth: [birth year]  The following the equipment is recommended by occupational therapy to increase pt's ability to perform ADL and decrease burden of care:  B-DABSC: Pt is not currently at a level to stand and performs transfer by lateral scooting. Pt also performs toileting hygiene seated by weight shifting on platforms of B-DABABSC. This device would increase overall independence and benefit the pt and care partner.     Laren Whaling Woods-Chance, MS, OTR/L 02/18/2024, 11:52 AM

## 2024-02-18 NOTE — Progress Notes (Signed)
 [facility] Progress Note   Subjective:  Seen in room - no CP/dyspnea. Issues cannulating his AVF yet again yesterday with HD, but eventually able to get it going.  Objective Vitals:   02/17/24 1931 02/17/24 1935 02/18/24 0545 02/18/24 0600  BP: (!) 139/44 110/64 110/65   Pulse: 74 74 75   Resp: 18  18   Temp: 97.8 F (36.6 C)  98.2 F (36.8 C)   TempSrc: Oral     SpO2: 100%  100%   Weight:    72.5 kg  Height:       Physical Exam General: Well appearing man, NAD. Room air Heart: RRR Lungs: CTAB Abdomen: soft, chole tube in place Extremities: Trace LE edema Dialysis Access:  LUE AVF +t/b  Additional Objective Labs: Basic Metabolic Panel: Recent Labs  Lab 02/12/24 1217 02/14/24 1430 02/17/24 1259  NA 131* 129* 132*  K 4.6 4.4 4.6  CL 93* 92* 98  CO2 26 27 26   GLUCOSE 131* 113* 163*  BUN 66* 57* 57*  CREATININE 7.40* 5.56* 6.57*  CALCIUM  8.5* 8.3* 8.7*  PHOS 4.1 3.1 2.7   Liver Function Tests: Recent Labs  Lab 02/12/24 1217 02/14/24 1430 02/17/24 1259  ALBUMIN  2.3* 2.1* 2.2*   CBC: Recent Labs  Lab 02/11/24 1853 02/14/24 1438 02/15/24 0542 02/16/24 0603 02/17/24 1259 02/18/24 1029  WBC 4.9 4.7  --  4.4 4.9  --   NEUTROABS 3.4  --   --   --   --   --   HGB 8.3* 6.8*   < > 8.3* 7.6* 8.8*  HCT 26.8* 22.1*   < > 26.1* 23.8* 28.1*  MCV 96.4 96.9  --  94.9 94.8  --   PLT 74* 38*  --  34* 47*  --    < > = values in this interval not displayed.   Blood Culture    Component Value Date/Time   SDES FLUID 02/17/2024 1045   SPECREQUEST GALLBLADDER 02/17/2024 1045   CULT ABUNDANT GRAM NEGATIVE RODS 02/17/2024 1045   REPTSTATUS PENDING 02/17/2024 1045   Studies/Results: ECHOCARDIOGRAM COMPLETE Result Date: 02/17/2024    ECHOCARDIOGRAM REPORT   Patient Name:   Phillip [name] Date of Exam: 02/17/2024 Medical Rec #:  [id number]        Height:       72.0 in Accession #:    [id number]       Weight:       162.5 lb Date of Birth:  1955-06-24        BSA:          1.950 m Patient Age:    67 years         BP:           130/50 mmHg Patient Gender: M                HR:           66 bpm. Exam Location:  Inpatient Procedure: 2D Echo, Cardiac Doppler and Color Doppler (Both Spectral and Color            Flow Doppler were utilized during procedure). Indications:    Post TAVR evaluation Z95.2  History:        Patient has prior history of Echocardiogram examinations, most                 recent 01/21/2024. CAD, Prior CABG, Arrythmias:Bradycardia; Risk  Factors:Diabetes and Hypertension. H/O Hyperlipidemia, Edema,                 Weakness, Palpitations, Aortic Stenosis S/P TAVR, Pulmonary                 hypertension, End stage renal disease.                 Aortic Valve: 29 mm Sapien prosthetic, stented (TAVR) valve is                 present in the aortic position.  Sonographer:    BERNARDA ROCKS Referring Phys: Kotaro Buer R THOMPSON IMPRESSIONS  1. Left ventricular ejection fraction, by estimation, is 25 to 30%. The left ventricle has severely decreased function. The left ventricle demonstrates global hypokinesis. The left ventricular internal cavity size was mildly dilated. Left ventricular diastolic parameters are indeterminate.  2. Right ventricular systolic function is mildly reduced. The right ventricular size is normal. There is mildly elevated pulmonary artery systolic pressure.  3. Left atrial size was mildly dilated.  4. The mitral valve is normal in structure. Mild to moderate mitral valve regurgitation.  5. The aortic valve has been repaired/replaced. Aortic valve regurgitation is not visualized. There is a 29 mm Sapien prosthetic (TAVR) valve present in the aortic position. Aortic valve area, by VTI measures 2.62 cm. Aortic valve mean gradient measures 8.0 mmHg. Aortic valve Vmax measures 2.00 m/s.  6. The inferior vena cava is normal in size with greater than 50% respiratory variability, suggesting right atrial pressure of 3 mmHg. FINDINGS   Left Ventricle: Left ventricular ejection fraction, by estimation, is 25 to 30%. The left ventricle has severely decreased function. The left ventricle demonstrates global hypokinesis. The left ventricular internal cavity size was mildly dilated. There is no left ventricular hypertrophy. Left ventricular diastolic parameters are indeterminate. Right Ventricle: The right ventricular size is normal. No increase in right ventricular wall thickness. Right ventricular systolic function is mildly reduced. There is mildly elevated pulmonary artery systolic pressure. The tricuspid regurgitant velocity  is 2.95 m/s, and with an assumed right atrial pressure of 3 mmHg, the estimated right ventricular systolic pressure is 37.8 mmHg. Left Atrium: Left atrial size was mildly dilated. Right Atrium: Right atrial size was normal in size. Pericardium: There is no evidence of pericardial effusion. Mitral Valve: The mitral valve is normal in structure. Mild mitral annular calcification. Mild to moderate mitral valve regurgitation. MV peak gradient, 10.0 mmHg. The mean mitral valve gradient is 4.0 mmHg. Tricuspid Valve: The tricuspid valve is normal in structure. Tricuspid valve regurgitation is trivial. Aortic Valve: The aortic valve has been repaired/replaced. Aortic valve regurgitation is not visualized. Aortic valve mean gradient measures 8.0 mmHg. Aortic valve peak gradient measures 16.0 mmHg. Aortic valve area, by VTI measures 2.62 cm. There is a 29 mm Sapien prosthetic, stented (TAVR) valve present in the aortic position. Pulmonic Valve: The pulmonic valve was normal in structure. Pulmonic valve regurgitation is not visualized. Aorta: The aortic root is normal in size and structure. Venous: The inferior vena cava is normal in size with greater than 50% respiratory variability, suggesting right atrial pressure of 3 mmHg. IAS/Shunts: The interatrial septum was not well visualized.  LEFT VENTRICLE PLAX 2D LVIDd:         6.20 cm       Diastology LVIDs:         5.80 cm      LV e' medial:    14.90 cm/s LV PW:  0.80 cm      LV E/e' medial:  9.6 LV IVS:        1.00 cm      LV e' lateral:   10.70 cm/s LVOT diam:     2.10 cm      LV E/e' lateral: 13.4 LV SV:         96 LV SV Index:   49 LVOT Area:     3.46 cm  LV Volumes (MOD) LV vol d, MOD A2C: 402.0 ml LV vol d, MOD A4C: 302.0 ml LV vol s, MOD A2C: 222.0 ml LV vol s, MOD A4C: 184.0 ml LV SV MOD A2C:     180.0 ml LV SV MOD A4C:     302.0 ml LV SV MOD BP:      154.8 ml RIGHT VENTRICLE            IVC RV Basal diam:  4.70 cm    IVC diam: 2.00 cm RV S prime:     7.72 cm/s RVOT diam:      3.40 cm TAPSE (M-mode): 1.7 cm RVSP:           37.8 mmHg LEFT ATRIUM             Index        RIGHT ATRIUM           Index LA diam:        4.00 cm 2.05 cm/m   RA Pressure: 3.00 mmHg LA Vol (A2C):   63.6 ml 32.61 ml/m  RA Area:     16.20 cm LA Vol (A4C):   54.8 ml 28.10 ml/m  RA Volume:   37.20 ml  19.07 ml/m LA Biplane Vol: 59.1 ml 30.30 ml/m  AORTIC VALVE                     PULMONIC VALVE AV Area (Vmax):    2.55 cm      PV Vmax:       1.36 m/s AV Area (Vmean):   2.19 cm      PV Peak grad:  7.5 mmHg AV Area (VTI):     2.62 cm AV Vmax:           200.00 cm/s AV Vmean:          132.000 cm/s AV VTI:            0.368 m AV Peak Grad:      16.0 mmHg AV Mean Grad:      8.0 mmHg LVOT Vmax:         147.00 cm/s LVOT Vmean:        83.500 cm/s LVOT VTI:          0.278 m LVOT/AV VTI ratio: 0.76  AORTA Ao Root diam: 3.20 cm Ao Asc diam:  3.80 cm MITRAL VALVE                TRICUSPID VALVE MV Area (PHT): 3.93 cm     TR Peak grad:   34.8 mmHg MV Area VTI:   2.29 cm     TR Vmax:        295.00 cm/s MV Peak grad:  10.0 mmHg    Estimated RAP:  3.00 mmHg MV Mean grad:  4.0 mmHg     RVSP:           37.8 mmHg MV Vmax:       1.58 m/s MV Vmean:  87.3 cm/s    SHUNTS MV Decel Time: 193 msec     Systemic VTI:  0.28 m MR Peak grad: 83.9 mmHg     Systemic Diam: 2.10 cm MR Mean grad: 53.0 mmHg     Pulmonic Diam: 3.40 cm MR  Vmax:      458.00 cm/s MR Vmean:     343.0 cm/s MV E velocity: 143.00 cm/s MV A velocity: 112.00 cm/s MV E/A ratio:  1.28 Aditya Sabharwal Electronically signed by Ria Commander Signature Date/Time: 02/17/2024/5:20:06 PM    Final    IR EXCHANGE BILIARY DRAIN Result Date: 02/17/2024 INDICATION: Occluded cholecystostomy EXAM: FLUOROSCOPIC EXCHANGE AND UPSIZE OF THE EXISTING CHOLECYSTOSTOMY MEDICATIONS: 1% LIDOCAINE  LOCAL ANESTHESIA/SEDATION: Moderate (conscious) sedation was employed during this procedure. A total of Versed  1.0 mg and Fentanyl  25 mcg was administered intravenously by the radiology nurse. Total intra-service moderate Sedation Time: 10 minutes. The patient's level of consciousness and vital signs were monitored continuously by radiology nursing throughout the procedure under my direct supervision. FLUOROSCOPY: Radiation Exposure Index (as provided by the fluoroscopic device): 7.0 mGy Kerma COMPLICATIONS: None immediate. PROCEDURE: Informed written consent was obtained from the patient after a thorough discussion of the procedural risks, benefits and alternatives. All questions were addressed. Maximal Sterile Barrier Technique was utilized including caps, mask, sterile gowns, sterile gloves, sterile drape, hand hygiene and skin antiseptic. A timeout was performed prior to the initiation of the procedure. Under sterile conditions and local anesthesia attempts were made to remove the occluded catheter. This was unsuccessful. Catheter was cut and removed. Percutaneous tract was recannulated with a Kumpe catheter. Catheter advanced into the gallbladder. Contrast injection confirms gallbladder packed with stones. Cystic duct occluded. Amplatz guidewire inserted. Tract dilatation performed to insert a 12 French cholecystostomy. Retention loop formed in the gallbladder. Contrast injection confirms position. Images obtained for documentation. Catheter secured with a silk suture and a sterile dressing.  Gravity drainage bag connected. IMPRESSION: Successful fluoroscopic exchange and upsize of the percutaneous cholecystostomy Electronically Signed   By: CHRISTELLA.  Shick M.D.   On: 02/17/2024 12:10   Medications:   (feeding supplement) PROSource Plus  30 mL Oral BID BM   sodium chloride    Intravenous Once   sodium chloride    Intravenous Once   amoxicillin -clavulanate  1 tablet Oral QHS   aspirin  EC  81 mg Oral Daily   atorvastatin   20 mg Oral Daily   B-complex with vitamin C  1 tablet Oral Daily   carvedilol   3.125 mg Oral QHS   Chlorhexidine  Gluconate Cloth  6 each Topical Q0600   cholecalciferol   1,000 Units Oral Daily   clopidogrel   75 mg Oral Daily   darbepoetin (ARANESP ) injection - DIALYSIS  100 mcg Subcutaneous Weekly   docusate sodium   100 mg Oral Daily   doxycycline   100 mg Oral Q12H   gabapentin   200 mg Oral QHS   [START ON 02/19/2024] heparin  injection (subcutaneous)  5,000 Units Subcutaneous Daily   isosorbide  mononitrate  30 mg Oral Daily   l-methylfolate-B6-B12  1 tablet Oral Daily   latanoprost   1 drop Both Eyes QHS   levothyroxine   50 mcg Oral Daily   nutrition supplement (JUVEN)  1 packet Oral BID BM   sevelamer  carbonate  800 mg Oral TID WC   sodium chloride  flush  5 mL Intracatheter Q8H    Dialysis Orders TTS [facility] 4hr, 350/800, EDW 81kg, 2K/2.5Ca, L AVF, no heparin  - no VDRA, ESA ordered but not yet given  Assessment/Plan: 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 Per [facility].  In CIR. Severe aortic stenosis s/p TAVR 01/20/24: Per PMD.  ESRD: HD TTS - next HD tomorrow (9/18) Vascular Access: S/p AVF infiltration and difficulty cannulating at his outpatient HD center. F'gram completed 9/8 - normal/patent AVF.  Needs expert cannulator with every treatment.   HTN/volume: BP in goal. Continue carvedilol  3.125mg  BID and Imdur  30mg  every day. S/p amputation, will need lower edw on discharge.  Anemia of ESRD: Hgb 8.8. S/p 1U PRBCs on  9/13. Continue Aranesp  100mcg q Friday. Secondary HPTH: CorrCa ok and phos ok- continue sevelamer  as binder. Nutrition: Alb low, continue supps. Hx cholecystitis with perc chole drain still in place: Erythema/purulence around drain. S/p tube exchange 9/2. Remains on Augmentin . Repeat CT 9/14 without abscess or other changes to area, s/p exchange to larger tube 9/16. T2DM Dispo: In CIR, tentative d/c 9/19 per notes.   Izetta Boehringer, PA-C 02/18/2024, 11:17 AM  [employer]

## 2024-02-18 NOTE — Plan of Care (Signed)
  Problem: Consults Goal: RH LIMB LOSS PATIENT EDUCATION Description: Description: See Patient Education module for eduction specifics. Outcome: Progressing   Problem: RH BOWEL ELIMINATION Goal: RH STG MANAGE BOWEL WITH ASSISTANCE Description: STG Manage Bowel with mod I Assistance. Outcome: Progressing   Problem: RH SKIN INTEGRITY Goal: RH STG SKIN FREE OF INFECTION/BREAKDOWN Outcome: Progressing   Problem: RH SAFETY Goal: RH STG ADHERE TO SAFETY PRECAUTIONS W/ASSISTANCE/DEVICE Description: STG Adhere to Safety Precautions With mod I Assistance/Device. Outcome: Progressing   Problem: RH PAIN MANAGEMENT Goal: RH STG PAIN MANAGED AT OR BELOW PT'S PAIN GOAL Description: <4 w/ prns Outcome: Progressing   Problem: RH KNOWLEDGE DEFICIT LIMB LOSS Goal: RH STG INCREASE KNOWLEDGE OF SELF CARE AFTER LIMB LOSS Description: Manage increase knowledge of self carte after limb loss with mod I assistance from wife using educational materials provided Outcome: Progressing   Problem: Education: Goal: Knowledge of General Education information will improve Description: Including pain rating scale, medication(s)/side effects and non-pharmacologic comfort measures Outcome: Progressing   Problem: Health Behavior/Discharge Planning: Goal: Ability to manage health-related needs will improve Outcome: Progressing   Problem: Clinical Measurements: Goal: Ability to maintain clinical measurements within normal limits will improve Outcome: Progressing Goal: Will remain free from infection Outcome: Progressing Goal: Diagnostic test results will improve Outcome: Progressing Goal: Respiratory complications will improve Outcome: Progressing Goal: Cardiovascular complication will be avoided Outcome: Progressing   Problem: Activity: Goal: Risk for activity intolerance will decrease Outcome: Progressing   Problem: Nutrition: Goal: Adequate nutrition will be maintained Outcome: Progressing    Problem: Coping: Goal: Level of anxiety will decrease Outcome: Progressing   Problem: Elimination: Goal: Will not experience complications related to bowel motility Outcome: Progressing Goal: Will not experience complications related to urinary retention Outcome: Progressing   Problem: Pain Managment: Goal: General experience of comfort will improve and/or be controlled Outcome: Progressing   Problem: Safety: Goal: Ability to remain free from injury will improve Outcome: Progressing   Problem: Skin Integrity: Goal: Risk for impaired skin integrity will decrease Outcome: Progressing

## 2024-02-18 NOTE — Progress Notes (Addendum)
 PROGRESS NOTE   Subjective/Complaints: No new complaints this morning Echo was completed yesterday Discussed Hgb 7.6, he is agreeable to 1U PRBC with dialysis    02/18/2024    6:00 AM 02/18/2024    5:45 AM 02/17/2024    7:35 PM  Vitals with BMI  Weight 159 lbs 13 oz    BMI 21.67    Systolic  110 110  Diastolic  65 64  Pulse  75 74   ROS: Patient denies fever, rash, sore throat, blurred vision, dizziness, nausea, vomiting, diarrhea, cough, shortness of breath or chest pain, back/neck pain, headache, or mood change. +fatigue  Objective:   ECHOCARDIOGRAM COMPLETE Result Date: 02/17/2024    ECHOCARDIOGRAM REPORT   Patient Name:   Phillip [name] Date of Exam: 02/17/2024 Medical Rec #:  [id number]        Height:       72.0 in Accession #:    [id number]       Weight:       162.5 lb Date of Birth:  12-Oct-1955       BSA:          1.950 m Patient Age:    68 years         BP:           130/50 mmHg Patient Gender: M                HR:           66 bpm. Exam Location:  Inpatient Procedure: 2D Echo, Cardiac Doppler and Color Doppler (Both Spectral and Color            Flow Doppler were utilized during procedure). Indications:    Post TAVR evaluation Z95.2  History:        Patient has prior history of Echocardiogram examinations, most                 recent 01/21/2024. CAD, Prior CABG, Arrythmias:Bradycardia; Risk                 Factors:Diabetes and Hypertension. H/O Hyperlipidemia, Edema,                 Weakness, Palpitations, Aortic Stenosis S/P TAVR, Pulmonary                 hypertension, End stage renal disease.                 Aortic Valve: 29 mm Sapien prosthetic, stented (TAVR) valve is                 present in the aortic position.  Sonographer:    BERNARDA ROCKS Referring Phys: KATHRYN R THOMPSON IMPRESSIONS  1. Left ventricular ejection fraction, by estimation, is 25 to 30%. The left ventricle has severely decreased function. The left  ventricle demonstrates global hypokinesis. The left ventricular internal cavity size was mildly dilated. Left ventricular diastolic parameters are indeterminate.  2. Right ventricular systolic function is mildly reduced. The right ventricular size is normal. There is mildly elevated pulmonary artery systolic pressure.  3. Left atrial size was mildly dilated.  4. The mitral valve is normal in  structure. Mild to moderate mitral valve regurgitation.  5. The aortic valve has been repaired/replaced. Aortic valve regurgitation is not visualized. There is a 29 mm Sapien prosthetic (TAVR) valve present in the aortic position. Aortic valve area, by VTI measures 2.62 cm. Aortic valve mean gradient measures 8.0 mmHg. Aortic valve Vmax measures 2.00 m/s.  6. The inferior vena cava is normal in size with greater than 50% respiratory variability, suggesting right atrial pressure of 3 mmHg. FINDINGS  Left Ventricle: Left ventricular ejection fraction, by estimation, is 25 to 30%. The left ventricle has severely decreased function. The left ventricle demonstrates global hypokinesis. The left ventricular internal cavity size was mildly dilated. There is no left ventricular hypertrophy. Left ventricular diastolic parameters are indeterminate. Right Ventricle: The right ventricular size is normal. No increase in right ventricular wall thickness. Right ventricular systolic function is mildly reduced. There is mildly elevated pulmonary artery systolic pressure. The tricuspid regurgitant velocity  is 2.95 m/s, and with an assumed right atrial pressure of 3 mmHg, the estimated right ventricular systolic pressure is 37.8 mmHg. Left Atrium: Left atrial size was mildly dilated. Right Atrium: Right atrial size was normal in size. Pericardium: There is no evidence of pericardial effusion. Mitral Valve: The mitral valve is normal in structure. Mild mitral annular calcification. Mild to moderate mitral valve regurgitation. MV peak gradient,  10.0 mmHg. The mean mitral valve gradient is 4.0 mmHg. Tricuspid Valve: The tricuspid valve is normal in structure. Tricuspid valve regurgitation is trivial. Aortic Valve: The aortic valve has been repaired/replaced. Aortic valve regurgitation is not visualized. Aortic valve mean gradient measures 8.0 mmHg. Aortic valve peak gradient measures 16.0 mmHg. Aortic valve area, by VTI measures 2.62 cm. There is a 29 mm Sapien prosthetic, stented (TAVR) valve present in the aortic position. Pulmonic Valve: The pulmonic valve was normal in structure. Pulmonic valve regurgitation is not visualized. Aorta: The aortic root is normal in size and structure. Venous: The inferior vena cava is normal in size with greater than 50% respiratory variability, suggesting right atrial pressure of 3 mmHg. IAS/Shunts: The interatrial septum was not well visualized.  LEFT VENTRICLE PLAX 2D LVIDd:         6.20 cm      Diastology LVIDs:         5.80 cm      LV e' medial:    14.90 cm/s LV PW:         0.80 cm      LV E/e' medial:  9.6 LV IVS:        1.00 cm      LV e' lateral:   10.70 cm/s LVOT diam:     2.10 cm      LV E/e' lateral: 13.4 LV SV:         96 LV SV Index:   49 LVOT Area:     3.46 cm  LV Volumes (MOD) LV vol d, MOD A2C: 402.0 ml LV vol d, MOD A4C: 302.0 ml LV vol s, MOD A2C: 222.0 ml LV vol s, MOD A4C: 184.0 ml LV SV MOD A2C:     180.0 ml LV SV MOD A4C:     302.0 ml LV SV MOD BP:      154.8 ml RIGHT VENTRICLE            IVC RV Basal diam:  4.70 cm    IVC diam: 2.00 cm RV S prime:     7.72 cm/s RVOT diam:  3.40 cm TAPSE (M-mode): 1.7 cm RVSP:           37.8 mmHg LEFT ATRIUM             Index        RIGHT ATRIUM           Index LA diam:        4.00 cm 2.05 cm/m   RA Pressure: 3.00 mmHg LA Vol (A2C):   63.6 ml 32.61 ml/m  RA Area:     16.20 cm LA Vol (A4C):   54.8 ml 28.10 ml/m  RA Volume:   37.20 ml  19.07 ml/m LA Biplane Vol: 59.1 ml 30.30 ml/m  AORTIC VALVE                     PULMONIC VALVE AV Area (Vmax):    2.55 cm       PV Vmax:       1.36 m/s AV Area (Vmean):   2.19 cm      PV Peak grad:  7.5 mmHg AV Area (VTI):     2.62 cm AV Vmax:           200.00 cm/s AV Vmean:          132.000 cm/s AV VTI:            0.368 m AV Peak Grad:      16.0 mmHg AV Mean Grad:      8.0 mmHg LVOT Vmax:         147.00 cm/s LVOT Vmean:        83.500 cm/s LVOT VTI:          0.278 m LVOT/AV VTI ratio: 0.76  AORTA Ao Root diam: 3.20 cm Ao Asc diam:  3.80 cm MITRAL VALVE                TRICUSPID VALVE MV Area (PHT): 3.93 cm     TR Peak grad:   34.8 mmHg MV Area VTI:   2.29 cm     TR Vmax:        295.00 cm/s MV Peak grad:  10.0 mmHg    Estimated RAP:  3.00 mmHg MV Mean grad:  4.0 mmHg     RVSP:           37.8 mmHg MV Vmax:       1.58 m/s MV Vmean:      87.3 cm/s    SHUNTS MV Decel Time: 193 msec     Systemic VTI:  0.28 m MR Peak grad: 83.9 mmHg     Systemic Diam: 2.10 cm MR Mean grad: 53.0 mmHg     Pulmonic Diam: 3.40 cm MR Vmax:      458.00 cm/s MR Vmean:     343.0 cm/s MV E velocity: 143.00 cm/s MV A velocity: 112.00 cm/s MV E/A ratio:  1.28 Aditya Sabharwal Electronically signed by Ria Commander Signature Date/Time: 02/17/2024/5:20:06 PM    Final    IR EXCHANGE BILIARY DRAIN Result Date: 02/17/2024 INDICATION: Occluded cholecystostomy EXAM: FLUOROSCOPIC EXCHANGE AND UPSIZE OF THE EXISTING CHOLECYSTOSTOMY MEDICATIONS: 1% LIDOCAINE  LOCAL ANESTHESIA/SEDATION: Moderate (conscious) sedation was employed during this procedure. A total of Versed  1.0 mg and Fentanyl  25 mcg was administered intravenously by the radiology nurse. Total intra-service moderate Sedation Time: 10 minutes. The patient's level of consciousness and vital signs were monitored continuously by radiology nursing throughout the procedure under my direct supervision. FLUOROSCOPY: Radiation Exposure Index (as provided by the  fluoroscopic device): 7.0 mGy Kerma COMPLICATIONS: None immediate. PROCEDURE: Informed written consent was obtained from the patient after a thorough discussion of  the procedural risks, benefits and alternatives. All questions were addressed. Maximal Sterile Barrier Technique was utilized including caps, mask, sterile gowns, sterile gloves, sterile drape, hand hygiene and skin antiseptic. A timeout was performed prior to the initiation of the procedure. Under sterile conditions and local anesthesia attempts were made to remove the occluded catheter. This was unsuccessful. Catheter was cut and removed. Percutaneous tract was recannulated with a Kumpe catheter. Catheter advanced into the gallbladder. Contrast injection confirms gallbladder packed with stones. Cystic duct occluded. Amplatz guidewire inserted. Tract dilatation performed to insert a 12 French cholecystostomy. Retention loop formed in the gallbladder. Contrast injection confirms position. Images obtained for documentation. Catheter secured with a silk suture and a sterile dressing. Gravity drainage bag connected. IMPRESSION: Successful fluoroscopic exchange and upsize of the percutaneous cholecystostomy Electronically Signed   By: CHRISTELLA.  Shick M.D.   On: 02/17/2024 12:10     Recent Labs    02/16/24 0603 02/17/24 1259  WBC 4.4 4.9  HGB 8.3* 7.6*  HCT 26.1* 23.8*  PLT 34* 47*   Recent Labs    02/17/24 1259  NA 132*  K 4.6  CL 98  CO2 26  GLUCOSE 163*  BUN 57*  CREATININE 6.57*  CALCIUM  8.7*    Intake/Output Summary (Last 24 hours) at 02/18/2024 0906 Last data filed at 02/18/2024 0742 Gross per 24 hour  Intake 659 ml  Output 101.9 ml  Net 557.1 ml        Physical Exam: Vital Signs Blood pressure 110/65, pulse 75, temperature 98.2 F (36.8 C), resp. rate 18, height 6' (1.829 m), weight 72.5 kg, SpO2 100%.  Constitutional: No distress . Vital signs reviewed.  Sitting upright in bed. HEENT: NCAT, EOMI, oral membranes moist Neck: supple Cardiovascular: RRR without murmur. No JVD    Respiratory/Chest: CTA Bilaterally without wheezes or rales. Normal effort    GI/Abdomen: BS +,  non-tender, non-distended, perc chole tube Ext: no clubbing, cyanosis, or edema Psych: pleasant and cooperative  Skin: Left aVF, left BK with s/s drainage, gauze, ACE wrap in place + Left AV fistula, with palpable thrill + Left BKA site, with with clean overlying dressing, shrinker on + Right flank biliary tube with thin, milky drainage around wound, no drainage in bulb.  No expressible pus.  Mild surrounding erythema, no warmth or tenderness.  Neuro: Alert and oriented to person, place, reasons, month/year.  Speech clearer. Cranial nerves II through XII grossly intact. Bilateral upper extremity strength 5 out of 5. Right lower extremity strength 4 out of 5 proximal, 5 out of 5 distal. Left lower extremity at least 4-5, improved as above 9/17 Sensation intact light touch in all extremities    Assessment/Plan: 1. Functional deficits which require 3+ hours per day of interdisciplinary therapy in a comprehensive inpatient rehab setting. Physiatrist is providing close team supervision and 24 hour management of active medical problems listed below. Physiatrist and rehab team continue to assess barriers to discharge/monitor patient progress toward functional and medical goals  Care Tool:  Bathing    Body parts bathed by patient: Right arm, Left arm, Chest, Abdomen, Front perineal area, Buttocks, Right upper leg, Left upper leg, Face   Body parts bathed by helper: Right lower leg Body parts n/a: Left lower leg   Bathing assist Assist Level: Minimal Assistance - Patient > 75%     Upper Body Dressing/Undressing  Upper body dressing   What is the patient wearing?: Pull over shirt    Upper body assist Assist Level: Set up assist    Lower Body Dressing/Undressing Lower body dressing      What is the patient wearing?: Underwear/pull up, Pants, Ace wrap/stump shrinker     Lower body assist Assist for lower body dressing: Moderate Assistance - Patient 50 - 74%      Toileting Toileting    Toileting assist Assist for toileting: Maximal Assistance - Patient 25 - 49%     Transfers Chair/bed transfer  Transfers assist     Chair/bed transfer assist level: Contact Guard/Touching assist     Locomotion Ambulation   Ambulation assist   Ambulation activity did not occur: Safety/medical concerns          Walk 10 feet activity   Assist  Walk 10 feet activity did not occur: Safety/medical concerns        Walk 50 feet activity   Assist Walk 50 feet with 2 turns activity did not occur: Safety/medical concerns         Walk 150 feet activity   Assist Walk 150 feet activity did not occur: Safety/medical concerns         Walk 10 feet on uneven surface  activity   Assist Walk 10 feet on uneven surfaces activity did not occur: Safety/medical concerns         Wheelchair     Assist Is the patient using a wheelchair?: Yes Type of Wheelchair: Manual    Wheelchair assist level: Independent Max wheelchair distance: 150'    Wheelchair 50 feet with 2 turns activity    Assist        Assist Level: Independent   Wheelchair 150 feet activity     Assist      Assist Level: Independent   Blood pressure 110/65, pulse 75, temperature 98.2 F (36.8 C), resp. rate 18, height 6' (1.829 m), weight 72.5 kg, SpO2 100%.  Medical Problem List and Plan: 1. Functional deficits secondary to left BKA 01/30/2024 after failed TMA with history of PAD status post PCI to left tibia/fibula angioplasty              -patient may  shower             -ELOS/Goals: 7 days with mod I goals for mobility and self-care  Chart and therapy notes reviewed, continue CIR, discussed patient's progress with therapy Grounds pass ordered Vitamin D3/Metanx/Vitamin B/C complex ordered F/u with me or Fidela in clinic within 1 month Would benefit from home health aide upon discharge   Should have cardiology follow-up within 1 week because  cardiology medications have been changed due to hypotension  Encouraged wife to work on ramp for home  2.  Anemia: Hgb reviewed and has improved to 8.8, heparin  d/ced after discussion with IR due to bright red blood in cholesytotomy, stool occult ordered and is negative, continue aranesp              -antiplatelet therapy: Aspirin  81 mg daily, Plavix  75 mg daily   3. Pain Management: decrease oxycodone  to 5mg  q8h given hypotension/fatigue Continue gabapentin   4. Insomnia: continue prn melatonin  5. Neuropsych/cognition: This patient is capable of making decisions on his own behalf.  -9/7pt appears clearer today. See above  6. Chole site irritation/yellow drainage: consulted IR - Recent acute cholecystitis June 2025.  Currently maintained on Augmentin  and linezolid  for 3 to 4 weeks and will confirm duration  of antibiotic.  Radiology following  for percutaneous cholecystostomy tube for recent cholecystitis. Awaiting plan for continued suction drainage versus change to gravity bag. IR consulted since chole tube site appears irritated, reviewed their note- recommending to maintain drain, repeat imaging ordered by IR and they will follow patient again 9/15  7. Fluids/Electrolytes/Nutrition: Routine in and out with follow-up chemistries  8.  Aortic stenosis.  Status post TAVR 01/20/2024  9.  End-stage renal disease.  Continue hemodialysis as per renal services             -HD at end of day to allow participation in therapy  - HD T TH S  10.  Hyperlipidemia.  Lipitor  11.  Hypotension: coreg  decreased to HS, imdur  decreased by nephrology to 30mg  daily.  Monitor with increased mobility  - 9-13: Blood pressure appears improved with decreased Imdur .    02/18/2024    6:00 AM 02/18/2024    5:45 AM 02/17/2024    7:35 PM  Vitals with BMI  Weight 159 lbs 13 oz    BMI 21.67    Systolic  110 110  Diastolic  65 64  Pulse  75 74   12.  Hypothyroidism.  continue Synthroid   13.  Acute on chronic  anemia: see #2  14.  CAD with history of CABG.  No chest pain or shortness of breath.  Continue Imdur   15.  BPH: d/c flomax  given hypotension, resolved  16.  Chronic diastolic congestive heart failure.  Monitor for any signs of fluid overload.  Follow-up per cardiology services.             -daily weights             -volume mgt with HD, weight reviewed and is stable Filed Weights   02/17/24 0514 02/17/24 1440 02/18/24 0600  Weight: 73.7 kg 73.7 kg 72.5 kg    17.  Constipation: Last BM documented 9/13, messaged nursing to ensure accuracy    LOS: 13 days A FACE TO FACE EVALUATION WAS PERFORMED  Seville Brick P Beauregard Jarrells 02/18/2024, 9:06 AM

## 2024-02-18 NOTE — Progress Notes (Signed)
 Speech Language Pathology Daily Session Note  Patient Details  Name: Phillip [name] MRN: [id number] Date of Birth: [birth year]  Today's Date: 02/18/2024 SLP Individual Time: 0930-1000 SLP Individual Time Calculation (min): 30 min  Short Term Goals: Week 1: SLP Short Term Goal 1 (Week 1): STGs=LTGs d/t ELOS  Skilled Therapeutic Interventions:   SLP conducted skilled therapy session targeting cognition goals. Patient appeared in positive and lighthearted mood compared to previous session. SLP facilitated moderately complex deduction puzzle to target problem solving and organization. Patient required mod-max cuing to decode clues and identify appropriate location of information due to task complexity. Patient was left in room with call bell in reach and alarm set. SLP will continue to target goals per plan of care.    Pain Pain Assessment Pain Scale: 0-10 Pain Score: 7  Pain Location: Leg Pain Intervention(s): Medication (See eMAR)  Therapy/Group: Individual Therapy  Ashley A Ellin 02/18/2024, 1:18 PM

## 2024-02-18 NOTE — Patient Care Conference (Signed)
 Inpatient RehabilitationTeam Conference and Plan of Care Update Date: 02/18/2024   Time: 11:41 AM    Patient Name: Phillip [name]      Medical Record Number: [id number]  Date of Birth: 30-Mar-1956 Sex: Male         Room/Bed: 4W16C/4W16C-01 Payor Info: Payor: [employer] / Plan: [employer] PPO / Product Type: *No Product type* /    Admit Date/Time:  02/05/2024  6:14 PM  Primary Diagnosis:  Left below-knee amputee [facility])  Hospital Problems: Principal Problem:   Left below-knee amputee [facility]) Active Problems:   Protein-calorie malnutrition, severe    Expected Discharge Date: Expected Discharge Date: 02/20/24  Team Members Present: Physician leading conference: Dr. Sven Elks Social Worker Present: Other (comment) (DiAsia Loreli PIES) Nurse Present: Barnie Ronde, RN PT Present: Therisa Stains, PT OT Present: Other (comment) (Genesis Woodchance OT) SLP Present: Rosina Downy, SLP PPS Coordinator present : Eleanor Colon, SLP     Current Status/Progress Goal Weekly Team Focus  Bowel/Bladder   Continent of bowel and bladder. Oliguric.   Remain continent   Assist patient to bathroom as needed    Swallow/Nutrition/ Hydration               ADL's   Mod-I UB, Supervision-Mod-I LB, Min A for toileting hygiene   Supervision ADLs, Mod-I for transfers   barries: family ed, global weakness, toileting hygiene for BM    Mobility   bed mobility supervision, squat<>pivot transfers/lateral scoots min A/CGA, sit<>stands from elevated surfaces mod A, WC mobility 18ft supervision/mod I - unable to ambulate   mod I WC mobility and bed mobility, supervision transfers, min A car transfers, min A gait  barriers: fear of falling, pain, family education training, weakness/deconditioning, decreased standing balance/coordination    Communication   mild to moderate voice deficits and mild speech deficits - patient states this is baseline and is currently at goal level    supervision   speech intelligibility strategies, maintaining goal level as speech tends to fluctuate per patient    Safety/Cognition/ Behavioral Observations  moderate deficits in memory, problem solving, attention, awareness, poor participation   min assist   memory, problem solving, attention, awareness    Pain   Pain to left BKA. PRN oxycodone    Pain less than 4/10   Assess pain every shift and as needed. Treat pain with PRN interventions as necessary    Skin   Left BKA incision with staples. JP drain site.   Skin will be free from infection and continue to heal.  Assess skin every shift and as needed. Provide wound care to JP site and left BKA.      Discharge Planning:  Patient plans to discharge home with wife, Phillip [name]. Family education held for 9/17. Phillip [name] will also provide transportation home. DME ordered: TTB, B-DAC, 18x18 WC   Team Discussion: Patient admitted post left BKA with recent cholecystitis/tube and history of ESRD/HD with poor recall, poor activity tolerance and weakness/deconditioning. Progress limited by moderate cognitive deficits, and fear of falling. Chole tube up-sized 02/17/24.  Phillip [name]  Patient on target to meet rehab goals: yes, currently needs supervision for lower body care and completes upper body care with mod I assist. Able to propel a wheelchair 150' with mod I assist. Needs min assist for cognition; working on problem solving.  *See Care Plan and progress notes for long and short-term goals.   Revisions to Treatment Plan:  CT scan of chole tube Transfusion of PRBCs   Teaching Needs: Safety,  medications, skin care, tube management, transfers, toileting, etc.   Current Barriers to Discharge: Decreased caregiver support, Home enviroment access/layout, Wound care, and Hemodialysis and self limiting behaviors.  Possible Resolutions to Barriers: Family education HH follow up services Ramp for entry to home Non emergent transport to home      Medical Summary Current Status: decreased cholcystotomy output, postoperative anemia, ESRD, postoperative pain, constipation, HLD, malnutrition  Barriers to Discharge: Medical stability;Renal Insufficiency/Failure  Barriers to Discharge Comments: decreased cholcystotomy output, postoperative anemia, ESRD, postoperative pain, constipation, HLD, malnutrition Possible Resolutions to [employer] Focus: IR consulted and cholecystotomy was upsized, 1U PRBC ordeded, nephrology consulted, continue HD, oxycodone  decreased to q8H prn, continue colace, continue lipitor, continue prosource   Continued Need for Acute Rehabilitation Level of Care: The patient requires daily medical management by a physician with specialized training in physical medicine and rehabilitation for the following reasons: Direction of a multidisciplinary physical rehabilitation program to maximize functional independence : Yes Medical management of patient stability for increased activity during participation in an intensive rehabilitation regime.: Yes Analysis of laboratory values and/or radiology reports with any subsequent need for medication adjustment and/or medical intervention. : Yes   I attest that I was present, lead the team conference, and concur with the assessment and plan of the team.   Fredericka Sober B 02/18/2024, 10:01 PM

## 2024-02-18 NOTE — Progress Notes (Signed)
 Heart Failure Navigator Progress Note  Assessed for Heart & Vascular TOC clinic readiness.  Patient does not meet criteria due to ESRD on hemodialysis. NO HF TOC. .   Navigator will sign off at this time.   Stephane Haddock, BSN, Scientist, clinical (histocompatibility and immunogenetics) Only

## 2024-02-18 NOTE — Progress Notes (Signed)
 Occupational Therapy Session Note  Patient Details  Name: Phillip [name] MRN: [id number] Date of Birth: Oct 04, 1955  Today's Date: 02/18/2024 OT Individual Time: 1030-1130 OT Individual Time Calculation (min): 60 min    Short Term Goals: Week 2:  OT Short Term Goal 1 (Week 2): LTG=STG d/t ELOS  Skilled Therapeutic Interventions/Progress Updates:  Skilled OT session completed to address pt/family education and discharge planning. Pt received seated in [facility] with wife present, agreeable to participate in therapy. Pt reports no pain.  OT provided education on DME need within the home including: B-DABSC, TTB, SB, and WC leg rests/arm rests for transfers. OT guided pt and wife through hands-on transfers with SB to and from bed. OT educated on proper SB positioning, safety when performing transfers, purpose of gait belt and body mechanics for care partners. Pt and wife verblized understanding. WC>Bed SB transfers with wife and pt, OT provided VC and tactile cues for positioning of slide board and cues wife can provide pt within the home, pt and wife displayed understanding throughout session with each transfer. OT educates wife on ensuring WC is at a safe distance prior to transfer to decrease risk for falls. Wife assists pt with WC>B-DABSC lateral scoot transfer with supervision. Pt requests for wife to assist with clothing mgmt, required increased encouragement to perform task with supervision. Pt declines Mod-I toilet hygiene, wife performs Total A. OT continues to reinforce pt performing tasks at highest level of independence to reduce caregiver burnout and increase self-sufficiency for pt. OT provides A to pull pants up in standing, OT does not recommend pt and wife perform standing within the home. Pt and wife verbalize understanding. Pt and wife perform BDABSC>WC lateral scoot transfer with supervision OT providing cues to wife for [facility] positioning. Wife and pt manage WC leg/arm rests independently.    At end of session, pt seated in [facility] performing hand hygiene with wife present and all needs met.  Therapy Documentation Precautions:  Precautions Precautions: Fall Recall of Precautions/Restrictions: Intact Precaution/Restrictions Comments: R chole drain, L BKA Required Braces or Orthoses: Other Brace Other Brace: limb guard Restrictions Weight Bearing Restrictions Per Provider Order: No LLE Weight Bearing Per Provider Order: Partial weight bearing   Therapy/Group: Individual Therapy  Susa Bones Woods-Chance, MS, OTR/L 02/18/2024, 7:59 AM

## 2024-02-18 NOTE — Progress Notes (Signed)
 Patient ID: Phillip [name], male   DOB: 10-Mar-1956, 68 y.o.   MRN: [id number]  Have reviewed team conference with pt and family. Both aware and agreeable with targeted d/c date of 9/19 and goals of Independent with assistive device. Ramp being isntalled by [name]'s brother and should be functional by Friday 9/19, in time for dialysis on Saturday. DME ordered.

## 2024-02-18 NOTE — Progress Notes (Signed)
 Patient ID: Phillip [name], male   DOB: Mar 07, 1956, 68 y.o.   MRN: [id number]  Transportation set up with [facility] for pickup on  Friday 9/19 @ 11am

## 2024-02-18 NOTE — Progress Notes (Signed)
 Referring Provider(s): Dr. DELENA Odell Castor  Supervising Physician: Phillip Cornet  Patient Status:  [facility] - In-pt  Chief Complaint: Acute cholecystitis s/p cholecystostomy tube placement w/ most recent exchange on 9/16  Brief History:  68 year old male with a history of ESRD on HD, complicated cardiac history, and GERD who presented to the ED in June with complaints of acute abdominal pain after being found febrile at dialysis. Due to significant cardiac history and ESRD, patient was not deemed a surgical candidate and was referred to IR for perc chole placement which he underwent on 6/11. He followed up with Surgery on 8/11 who recommended no consideration for cholecystectomy until after his TAVR. Unfortunately, he ended up being admitted to the hospital on 8/13 following amputation of his left great toe. Patient has undergone several exchanges during his hospital stay due to recurrent occlusions.  Subjective:  Team messaged IR today due to concern for bloody output in the chole drain. Patient evaluated at the bedside. Family reports bloody output for the past day. Patient denies any pain at drain site, fevers/chills, palpitations, dizziness/lightheadedness.  Allergies: Patient has no known allergies.  Medications: Prior to Admission medications   Medication Sig Start Date End Date Taking? Authorizing Provider  acetaminophen  (TYLENOL ) 500 MG tablet Take 1,000 mg by mouth every 6 (six) hours as needed for moderate pain or headache.     [provider]  amoxicillin -clavulanate (AUGMENTIN ) 500-125 MG tablet Take 1 tablet by mouth 2 (two) times daily. 02/05/24   Mcarthur Pick, MD  aspirin  EC 81 MG tablet Take 1 tablet (81 mg total) by mouth daily. Swallow whole. 07/25/23   Thukkani, Arun K, MD  atorvastatin  (LIPITOR) 20 MG tablet Take 1 tablet (20 mg total) by mouth daily. 07/25/23   Thukkani, Arun K, MD  bisacodyl  (DULCOLAX) 5 MG EC tablet Take 1 tablet (5 mg total) by mouth daily as  needed for moderate constipation. 02/05/24   Sigdel, Santosh, MD  carvedilol  (COREG ) 3.125 MG tablet Take 1 tablet (3.125 mg total) by mouth 2 (two) times daily. 05/20/23 05/02/24  Carlin Delon BROCKS, NP  clopidogrel  (PLAVIX ) 75 MG tablet Take 1 tablet (75 mg total) by mouth daily. 12/26/23   Marea Selinda RAMAN, MD  gabapentin  (NEURONTIN ) 300 MG capsule Take 1 capsule (300 mg total) by mouth at bedtime. Patient taking differently: Take 600 mg by mouth 2 (two) times daily. 08/25/23   Arrien, Elidia Sieving, MD  insulin  glargine (LANTUS ) 100 UNIT/ML injection Inject 0.07 mLs (7 Units total) into the skin at bedtime. Inject 7 units at bedtime as needed after checking glucose 02/05/24   Mcarthur Pick, MD  isosorbide  mononitrate (IMDUR ) 120 MG 24 hr tablet Take 1 tablet (120 mg total) by mouth daily. 08/27/23   Revankar, Jennifer SAUNDERS, MD  latanoprost  (XALATAN ) 0.005 % ophthalmic solution Place 1 drop into both eyes at bedtime. 04/15/23   [provider]  levothyroxine  (SYNTHROID ) 50 MCG tablet Take 50 mcg by mouth daily. 04/05/23   [provider]  linezolid  (ZYVOX ) 600 MG tablet Take 1 tablet (600 mg total) by mouth every 12 (twelve) hours. 02/05/24   Sigdel, Santosh, MD  Methoxy PEG-Epoetin Beta (MIRCERA IJ) 30 mcg. 12/16/23 12/14/24  [provider]  nitroGLYCERIN  (NITROSTAT ) 0.4 MG SL tablet Place 1 tablet (0.4 mg total) under the tongue every 5 (five) minutes as needed for chest pain. 11/20/20   Revankar, Jennifer SAUNDERS, MD  sevelamer  carbonate (RENVELA ) 800 MG tablet Take 800 mg by mouth 3 (three)  times daily with meals. 09/04/23 11/29/24  [provider]  sodium chloride  flush (NS) 0.9 % SOLN Inject 5 mLs into the vein daily for 15 days. flush drain daily with 5 cc NS, record output daily, dressing changes every 2-3 days or earlier if soiled 11/14/23 11/29/23  Darci Pore, MD  sodium chloride  flush 0.9 % SOLN injection 10 mLs by Intracatheter route every 3 (three) days. 01/02/24 04/01/24   Covington, Jamie R, NP  sodium chloride  flush 0.9 % SOLN injection 10 mLs by Intracatheter route daily. 02/18/24   Joanmarie Tsang M, PA-C  tamsulosin  (FLOMAX ) 0.4 MG CAPS capsule Take 0.4 mg by mouth daily after breakfast. 04/19/20   [provider]  traMADol  (ULTRAM ) 50 MG tablet Take 50 mg by mouth every 8 (eight) hours as needed. 09/15/23   [provider]    Vital Signs: BP (!) 97/54 (BP Location: Right Arm)   Pulse 76   Temp 98.7 F (37.1 C)   Resp 18   Ht 6' (1.829 m)   Wt 159 lb 13.3 oz (72.5 kg)   SpO2 100%   BMI 21.68 kg/m   Physical Exam Constitutional:      Appearance: Normal appearance.  Cardiovascular:     Rate and Rhythm: Normal rate.  Pulmonary:     Effort: Pulmonary effort is normal.  Abdominal:     General: Abdomen is flat.     Palpations: Abdomen is soft.     Tenderness: There is no abdominal tenderness.     Comments: RUQ drain sutured and stat-locked in place; insertion site is non-erythematous. Overlying bandage is clean and dry. ~200cc serosanguinous fluid in gravity bag. Flushes easily  Skin:    General: Skin is warm and dry.  Neurological:     Mental Status: He is alert and oriented to person, place, and time.    Drain Location: RUQ Size: Fr size: 12 Fr Date of placement: 02/17/24  Currently to: Drain collection device: gravity 24 hour output:  Output by Drain (mL) 02/16/24 0701 - 02/16/24 1900 02/16/24 1901 - 02/17/24 0700 02/17/24 0701 - 02/17/24 1900 02/17/24 1901 - 02/18/24 0700 02/18/24 0701 - 02/18/24 1443  Biliary Tube Cholecystostomy 12 Fr. RUQ    100     Interval imaging/drain manipulation:  None  Current examination: Flushes/aspirates easily.  Insertion site unremarkable. Suture and stat lock in place. Dressed appropriately.   Labs:  CBC: Recent Labs    02/11/24 1853 02/14/24 1438 02/15/24 0542 02/16/24 0603 02/17/24 1259 02/18/24 1029  WBC 4.9 4.7  --  4.4 4.9  --   HGB 8.3* 6.8* 9.1* 8.3* 7.6* 8.8*   HCT 26.8* 22.1* 28.2* 26.1* 23.8* 28.1*  PLT 74* 38*  --  34* 47*  --     COAGS: Recent Labs    11/11/23 1930 11/12/23 1011 01/19/24 1844 02/09/24 0515  INR 1.4* 1.3* 1.1 1.1  APTT  --   --  39*  --     BMP: Recent Labs    02/10/24 0444 02/12/24 1217 02/14/24 1430 02/17/24 1259  NA 129* 131* 129* 132*  K 5.2* 4.6 4.4 4.6  CL 89* 93* 92* 98  CO2 23 26 27 26   GLUCOSE 91 131* 113* 163*  BUN 95* 66* 57* 57*  CALCIUM  8.0* 8.5* 8.3* 8.7*  CREATININE 9.37* 7.40* 5.56* 6.57*  GFRNONAA 6* 7* 10* 9*    LIVER FUNCTION TESTS: Recent Labs    11/13/23 9178 01/15/24 0338 01/19/24 1844 01/26/24 0328 02/03/24 1630 02/10/24 0444  02/12/24 1217 02/14/24 1430 02/17/24 1259  BILITOT 2.4* 0.8 0.8 0.8  --   --   --   --   --   AST 187* 19 22 17   --   --   --   --   --   ALT 97* 11 <5 <5  --   --   --   --   --   ALKPHOS 123 142* 161* 96  --   --   --   --   --   PROT 8.1 7.6 9.4* 8.4*  --   --   --   --   --   ALBUMIN  2.7* 2.1* 2.5* 2.4*   < > 2.2* 2.3* 2.1* 2.2*   < > = values in this interval not displayed.    Assessment and Plan:  Acute Cholecystitis: Phillip [name] is a 68 y.o. male with a history of cardiac complications and acute cholecystis deemed to be a poor surgical candidate. Underwent initial drain placement on 6/11. Patient has undergone several drain exchanges while inpatient due to recurrent obstructions. Most recent exchange yesterday 9/16. Team messaged due to bloody output since exchange yesterday.  -Drain site is non-erythematous and non-bloody; flushes without difficulty  -No frank blood visualized -Team to hold SQ Heparin ; still on ASA and Plavix  due to recent cardiac history -Family with questions regarding cholecystectomy; discussed with patient and family that they should reach out to [facility] now that patient has undergone TAVR to see if they would be willing to move forward with surgery at this time; verbalized understanding -Patient  and family report likely discharge on Friday  -Additional at home supplies provided; saline flushes sent to [facility] -Outpatient f/u ordered with IR placed should patient not undergo cholecystectomy in the interim    While Inpatient: Continue TID flushes with 5 cc NS. Record output Q shift. Dressing changes QD or PRN if soiled.  Call IR APP or on call IR MD if difficulty flushing or sudden change in drain output.    Discharge planning: -IR follow up order placed -Given additional supplies and order for additional saline flushes if needed -Patient will present for routine drain exchange in 6-8 weeks if he does not undergo cholecystectomy in the meantime.  -IR scheduler will contact patient with date/time of appointment -Patient will need to flush drain QD with 5 cc NS, record output QD, dressing changes every 2-3 days or earlier if soiled.   IR will sign off. Please reach out with any additional questions or concerns.   Thank you for allowing our service to participate in Phillip [name] 's care.   Electronically Signed: Ula Couvillon M Ladasia Sircy, PA-C 02/18/2024, 2:08 PM    I spent a total of 15 Minutes at the the patient's bedside AND on the patient's hospital floor or unit, greater than 50% of which was counseling/coordinating care for drain care follow up.

## 2024-02-18 NOTE — Progress Notes (Addendum)
  [facility]   MULTIDISCIPLINARY HEART VALVE TEAM  Pt seen for 1 month follow up s/p TAVR (01/20/24) in CIR.   Echo yesterday showed EF 25-30%, mild to mod MR and normally functioning TAVR with a mean gradient of 8 mmHg and no PVL. He has NYHA class II symptoms, but making a lot of progress s/p L BKA.   I have arranged for a follow up with his primary cardiologist, Dr. Edwyna, in [place], on 03/03/24.  KCCQ completed below.   [place] Cardiomyopathy Questionnaire     02/18/2024   10:51 AM 12/15/2023   11:17 AM 09/22/2023   12:28 PM  KCCQ-12  1 a. Ability to shower/bathe Not at all limited Not at all limited Not at all limited  1 b. Ability to walk 1 block Slightly limited Not at all limited Other, Did not do  1 c. Ability to hurry/jog Other, Did not do Quite a bit limited Moderately limited  2. Edema feet/ankles/legs Never over the past 2 weeks Never over the past 2 weeks Never over the past 2 weeks  3. Limited by fatigue 1-2 times a week 1-2 times a week 1-2 times a week  4. Limited by dyspnea Less than once a week 1-2 times a week 1-2 times a week  5. Sitting up / on 3+ pillows Never over the past 2 weeks Never over the past 2 weeks Less than once a week  6. Limited enjoyment of life Slightly limited Not limited at all Moderately limited  7. Rest of life w/ symptoms Mostly satisfied Mostly dissatisfied Not at all satisfied  8 a. Participation in hobbies N/A, did not do for other reasons Limited quite a bit Severely limited  8 b. Participation in chores N/A, did not do for other reasons Limited quite a bit Limited quite a bit  8 c. Visiting family/friends N/A, did not do for other reasons Moderately limited Limited quite a bit    Lamarr Hummer PA-C  [facility]

## 2024-02-19 ENCOUNTER — Other Ambulatory Visit (HOSPITAL_COMMUNITY): Payer: Self-pay

## 2024-02-19 LAB — BODY FLUID CULTURE W GRAM STAIN

## 2024-02-19 MED ORDER — ISOSORBIDE MONONITRATE ER 30 MG PO TB24
30.0000 mg | ORAL_TABLET | Freq: Every day | ORAL | 0 refills | Status: DC
  Filled 2024-02-19: qty 30, 30d supply, fill #0

## 2024-02-19 MED ORDER — METHOCARBAMOL 500 MG PO TABS
500.0000 mg | ORAL_TABLET | Freq: Three times a day (TID) | ORAL | 0 refills | Status: DC | PRN
  Filled 2024-02-19: qty 60, 20d supply, fill #0

## 2024-02-19 MED ORDER — CLOPIDOGREL BISULFATE 75 MG PO TABS
75.0000 mg | ORAL_TABLET | Freq: Every day | ORAL | 0 refills | Status: DC
  Filled 2024-02-19: qty 30, 30d supply, fill #0

## 2024-02-19 MED ORDER — GABAPENTIN 100 MG PO CAPS
200.0000 mg | ORAL_CAPSULE | Freq: Every day | ORAL | 0 refills | Status: DC
  Filled 2024-02-19: qty 30, 15d supply, fill #0

## 2024-02-19 MED ORDER — OXYCODONE HCL 5 MG PO TABS
5.0000 mg | ORAL_TABLET | Freq: Two times a day (BID) | ORAL | 0 refills | Status: DC | PRN
  Filled 2024-02-19: qty 30, 15d supply, fill #0

## 2024-02-19 MED ORDER — ATORVASTATIN CALCIUM 20 MG PO TABS
20.0000 mg | ORAL_TABLET | Freq: Every day | ORAL | 0 refills | Status: DC
Start: 2024-02-19 — End: 2024-02-19
  Filled 2024-02-19: qty 30, 30d supply, fill #0

## 2024-02-19 MED ORDER — LATANOPROST 0.005 % OP SOLN
1.0000 [drp] | Freq: Every day | OPHTHALMIC | 0 refills | Status: DC
  Filled 2024-02-19: qty 2.5, 50d supply, fill #0

## 2024-02-19 MED ORDER — SEVELAMER CARBONATE 800 MG PO TABS: 800.0000 mg | ORAL_TABLET | Freq: Three times a day (TID) | ORAL | 0 refills | Status: DC

## 2024-02-19 MED ORDER — B COMPLEX-C PO TABS
1.0000 | ORAL_TABLET | Freq: Every day | ORAL | 0 refills | Status: DC
  Filled 2024-02-19: qty 30, 30d supply, fill #0

## 2024-02-19 MED ORDER — NITROGLYCERIN 0.4 MG SL SUBL
0.4000 mg | SUBLINGUAL_TABLET | SUBLINGUAL | 0 refills | Status: DC | PRN
  Filled 2024-02-19: qty 25, 7d supply, fill #0

## 2024-02-19 MED ORDER — SEVELAMER CARBONATE 800 MG PO TABS
800.0000 mg | ORAL_TABLET | Freq: Three times a day (TID) | ORAL | 0 refills | Status: DC
  Filled 2024-02-19: qty 90, 30d supply, fill #0

## 2024-02-19 MED ORDER — LEVOTHYROXINE SODIUM 50 MCG PO TABS
50.0000 ug | ORAL_TABLET | Freq: Every day | ORAL | 0 refills | Status: DC
  Filled 2024-02-19: qty 30, 30d supply, fill #0

## 2024-02-19 MED ORDER — OXYCODONE HCL 5 MG PO TABS
5.0000 mg | ORAL_TABLET | Freq: Three times a day (TID) | ORAL | 0 refills | Status: DC | PRN
  Filled 2024-02-19: qty 30, 10d supply, fill #0

## 2024-02-19 MED ORDER — DOCUSATE SODIUM 100 MG PO CAPS: 100.0000 mg | ORAL_CAPSULE | Freq: Every day | ORAL | Status: DC

## 2024-02-19 MED ORDER — ATORVASTATIN CALCIUM 20 MG PO TABS
20.0000 mg | ORAL_TABLET | Freq: Every day | ORAL | 0 refills | Status: DC
  Filled 2024-02-19: qty 30, 30d supply, fill #0

## 2024-02-19 MED ORDER — AMOXICILLIN-POT CLAVULANATE 500-125 MG PO TABS
1.0000 | ORAL_TABLET | Freq: Two times a day (BID) | ORAL | 0 refills | Status: DC
  Filled 2024-02-19: qty 20, 10d supply, fill #0

## 2024-02-19 MED ORDER — NITROGLYCERIN 0.4 MG SL SUBL
0.4000 mg | SUBLINGUAL_TABLET | SUBLINGUAL | 0 refills | Status: DC | PRN
  Filled 2024-02-19: qty 10, 1d supply, fill #0

## 2024-02-19 MED ORDER — TAMSULOSIN HCL 0.4 MG PO CAPS
0.4000 mg | ORAL_CAPSULE | Freq: Every day | ORAL | 0 refills | Status: DC
  Filled 2024-02-19: qty 30, 30d supply, fill #0

## 2024-02-19 MED ORDER — ACETAMINOPHEN 325 MG PO TABS: 650.0000 mg | ORAL_TABLET | Freq: Four times a day (QID) | ORAL | Status: DC | PRN

## 2024-02-19 MED ORDER — GABAPENTIN 100 MG PO CAPS
200.0000 mg | ORAL_CAPSULE | Freq: Every day | ORAL | 0 refills | Status: DC
  Filled 2024-02-19: qty 60, 30d supply, fill #0

## 2024-02-19 MED ORDER — VITAMIN D3 25 MCG PO TABS
1000.0000 [IU] | ORAL_TABLET | Freq: Every day | ORAL | 0 refills | Status: DC
  Filled 2024-02-19: qty 30, 30d supply, fill #0

## 2024-02-19 MED ORDER — OXYCODONE HCL 5 MG PO TABS
5.0000 mg | ORAL_TABLET | Freq: Two times a day (BID) | ORAL | Status: DC | PRN
  Administered 2024-02-19 – 2024-02-22 (×7): 5 mg via ORAL
  Filled 2024-02-19 (×7): qty 1

## 2024-02-19 MED ORDER — CHLORHEXIDINE GLUCONATE CLOTH 2 % EX PADS
6.0000 | MEDICATED_PAD | Freq: Every day | CUTANEOUS | Status: DC
  Administered 2024-02-20 – 2024-02-23 (×4): 6 via TOPICAL

## 2024-02-19 MED ORDER — CARVEDILOL 3.125 MG PO TABS
3.1250 mg | ORAL_TABLET | Freq: Every day | ORAL | 0 refills | Status: DC
  Filled 2024-02-19: qty 30, 30d supply, fill #0

## 2024-02-19 MED ORDER — DOXYCYCLINE HYCLATE 100 MG PO TABS
100.0000 mg | ORAL_TABLET | Freq: Two times a day (BID) | ORAL | 0 refills | Status: DC
  Filled 2024-02-19: qty 20, 10d supply, fill #0

## 2024-02-19 NOTE — Plan of Care (Signed)
  Problem: Consults Goal: RH LIMB LOSS PATIENT EDUCATION Description: Description: See Patient Education module for eduction specifics. Outcome: Progressing Goal: Skin Care Protocol Initiated - if Braden Score 18 or less Description: If consults are not indicated, leave blank or document N/A Outcome: Progressing Goal: Nutrition Consult-if indicated Outcome: Progressing Goal: Diabetes Guidelines if Diabetic/Glucose > 140 Description: If diabetic or lab glucose is > 140 mg/dl - Initiate Diabetes/Hyperglycemia Guidelines & Document Interventions  Outcome: Progressing   Problem: RH BOWEL ELIMINATION Goal: RH STG MANAGE BOWEL WITH ASSISTANCE Description: STG Manage Bowel with Assistance. Outcome: Progressing Goal: RH STG MANAGE BOWEL W/MEDICATION W/ASSISTANCE Description: STG Manage Bowel with Medication with Assistance. Outcome: Progressing   Problem: RH BLADDER ELIMINATION Goal: RH STG MANAGE BLADDER WITH ASSISTANCE Description: STG Manage Bladder With Assistance Outcome: Progressing Goal: RH STG MANAGE BLADDER WITH MEDICATION WITH ASSISTANCE Description: STG Manage Bladder With Medication With Assistance. Outcome: Progressing Goal: RH STG MANAGE BLADDER WITH EQUIPMENT WITH ASSISTANCE Description: STG Manage Bladder With Equipment With Assistance Outcome: Progressing   Problem: RH SKIN INTEGRITY Goal: RH STG SKIN FREE OF INFECTION/BREAKDOWN Outcome: Progressing Goal: RH STG MAINTAIN SKIN INTEGRITY WITH ASSISTANCE Description: STG Maintain Skin Integrity With Assistance. Outcome: Progressing Goal: RH STG ABLE TO PERFORM INCISION/WOUND CARE W/ASSISTANCE Description: STG Able To Perform Incision/Wound Care With Assistance. Outcome: Progressing   Problem: RH SAFETY Goal: RH STG ADHERE TO SAFETY PRECAUTIONS W/ASSISTANCE/DEVICE Description: STG Adhere to Safety Precautions With Assistance/Device. Outcome: Progressing Goal: RH STG DECREASED RISK OF FALL WITH  ASSISTANCE Description: STG Decreased Risk of Fall With Assistance. Outcome: Progressing   Problem: RH PAIN MANAGEMENT Goal: RH STG PAIN MANAGED AT OR BELOW PT'S PAIN GOAL Outcome: Progressing   Problem: RH KNOWLEDGE DEFICIT LIMB LOSS Goal: RH STG INCREASE KNOWLEDGE OF SELF CARE AFTER LIMB LOSS Outcome: Progressing   Problem: RH Pre-functional/Other (Specify) Goal: RH LTG Pre-functional (Specify) Outcome: Progressing Goal: RH LTG Interdisciplinary (Specify) 1 Description: RH LTG Interdisciplinary (Specify)1 Outcome: Progressing Goal: RH LTG Interdisciplinary (Specify) 2 Description: RH LTG Interdisciplinary (Specify) 2 Outcome: Progressing

## 2024-02-19 NOTE — Plan of Care (Deleted)
  Problem: RH Eating Goal: LTG Patient will perform eating w/assist, cues/equip (OT) Description: LTG: Patient will perform eating with assist, with/without cues using equipment (OT) Outcome: Completed/Met   Problem: RH Grooming Goal: LTG Patient will perform grooming w/assist,cues/equip (OT) Description: LTG: Patient will perform grooming with assist, with/without cues using equipment (OT) Outcome: Completed/Met   Problem: RH Bathing Goal: LTG Patient will bathe all body parts with assist levels (OT) Description: LTG: Patient will bathe all body parts with assist levels (OT) Outcome: Completed/Met   Problem: RH Dressing Goal: LTG Patient will perform upper body dressing (OT) Description: LTG Patient will perform upper body dressing with assist, with/without cues (OT). Outcome: Completed/Met Goal: LTG Patient will perform lower body dressing w/assist (OT) Description: LTG: Patient will perform lower body dressing with assist, with/without cues in positioning using equipment (OT) Outcome: Completed/Met   Problem: RH Toileting Goal: LTG Patient will perform toileting task (3/3 steps) with assistance level (OT) Description: LTG: Patient will perform toileting task (3/3 steps) with assistance level (OT)  Outcome: Completed/Met   Problem: RH Toilet Transfers Goal: LTG Patient will perform toilet transfers w/assist (OT) Description: LTG: Patient will perform toilet transfers with assist, with/without cues using equipment (OT) Outcome: Completed/Met   Problem: RH Tub/Shower Transfers Goal: LTG Patient will perform tub/shower transfers w/assist (OT) Description: LTG: Patient will perform tub/shower transfers with assist, with/without cues using equipment (OT) Outcome: Completed/Met   Problem: RH Grooming Goal: LTG Patient will perform grooming w/assist,cues/equip (OT) Description: LTG: Patient will perform grooming with assist, with/without cues using equipment (OT) Outcome:  Completed/Met   Problem: RH Bathing Goal: LTG Patient will bathe all body parts with assist levels (OT) Description: LTG: Patient will bathe all body parts with assist levels (OT) Outcome: Completed/Met   Problem: RH Dressing Goal: LTG Patient will perform upper body dressing (OT) Description: LTG Patient will perform upper body dressing with assist, with/without cues (OT). Outcome: Completed/Met   Problem: RH Toilet Transfers Goal: LTG Patient will perform toilet transfers w/assist (OT) Description: LTG: Patient will perform toilet transfers with assist, with/without cues using equipment (OT) Outcome: Completed/Met   Problem: RH Tub/Shower Transfers Goal: LTG Patient will perform tub/shower transfers w/assist (OT) Description: LTG: Patient will perform tub/shower transfers with assist, with/without cues using equipment (OT) Outcome: Completed/Met   Problem: RH Awareness Goal: LTG: Patient will demonstrate awareness during functional activites type of (OT) Description: LTG: Patient will demonstrate awareness during functional activites type of (OT) Outcome: Completed/Met

## 2024-02-19 NOTE — Progress Notes (Addendum)
 [facility] Progress Note   Subjective:  Seen in room - for HD later today. No CP/dyspnea or AVF pain today.  Objective Vitals:   02/18/24 0600 02/18/24 1258 02/18/24 1931 02/19/24 0308  BP:  (!) 97/54 124/69 121/66  Pulse:  76 72 74  Resp:  18 18 18   Temp:  98.7 F (37.1 C) 98.7 F (37.1 C) 98.7 F (37.1 C)  TempSrc:   Oral   SpO2:  100% 100% 100%  Weight: 72.5 kg   71.2 kg  Height:       Physical Exam General: Well appearing man, NAD. Room air Heart: RRR Lungs: CTAB Abdomen: soft, chole tube in place - scant bloody drainage in bag Extremities: Trace LE edema Dialysis Access:  LUE AVF +t/b, + bruising  Additional Objective Labs: Basic Metabolic Panel: Recent Labs  Lab 02/12/24 1217 02/14/24 1430 02/17/24 1259  NA 131* 129* 132*  K 4.6 4.4 4.6  CL 93* 92* 98  CO2 26 27 26   GLUCOSE 131* 113* 163*  BUN 66* 57* 57*  CREATININE 7.40* 5.56* 6.57*  CALCIUM  8.5* 8.3* 8.7*  PHOS 4.1 3.1 2.7   Liver Function Tests: Recent Labs  Lab 02/12/24 1217 02/14/24 1430 02/17/24 1259  ALBUMIN  2.3* 2.1* 2.2*   CBC: Recent Labs  Lab 02/14/24 1438 02/15/24 0542 02/16/24 0603 02/17/24 1259 02/18/24 1029  WBC 4.7  --  4.4 4.9  --   HGB 6.8*   < > 8.3* 7.6* 8.8*  HCT 22.1*   < > 26.1* 23.8* 28.1*  MCV 96.9  --  94.9 94.8  --   PLT 38*  --  34* 47*  --    < > = values in this interval not displayed.   Studies/Results: ECHOCARDIOGRAM COMPLETE Result Date: 02/17/2024    ECHOCARDIOGRAM REPORT   Patient Name:   Phillip [name] Date of Exam: 02/17/2024 Medical Rec #:  [id number]        Height:       72.0 in Accession #:    [id number]       Weight:       162.5 lb Date of Birth:  1956/05/03       BSA:          1.950 m Patient Age:    67 years         BP:           130/50 mmHg Patient Gender: M                HR:           66 bpm. Exam Location:  Inpatient Procedure: 2D Echo, Cardiac Doppler and Color Doppler (Both Spectral and Color            Flow Doppler were  utilized during procedure). Indications:    Post TAVR evaluation Z95.2  History:        Patient has prior history of Echocardiogram examinations, most                 recent 01/21/2024. CAD, Prior CABG, Arrythmias:Bradycardia; Risk                 Factors:Diabetes and Hypertension. H/O Hyperlipidemia, Edema,                 Weakness, Palpitations, Aortic Stenosis S/P TAVR, Pulmonary                 hypertension, End stage renal disease.  Aortic Valve: 29 mm Sapien prosthetic, stented (TAVR) valve is                 present in the aortic position.  Sonographer:    BERNARDA ROCKS Referring Phys: Jaylene Arrowood R THOMPSON IMPRESSIONS  1. Left ventricular ejection fraction, by estimation, is 25 to 30%. The left ventricle has severely decreased function. The left ventricle demonstrates global hypokinesis. The left ventricular internal cavity size was mildly dilated. Left ventricular diastolic parameters are indeterminate.  2. Right ventricular systolic function is mildly reduced. The right ventricular size is normal. There is mildly elevated pulmonary artery systolic pressure.  3. Left atrial size was mildly dilated.  4. The mitral valve is normal in structure. Mild to moderate mitral valve regurgitation.  5. The aortic valve has been repaired/replaced. Aortic valve regurgitation is not visualized. There is a 29 mm Sapien prosthetic (TAVR) valve present in the aortic position. Aortic valve area, by VTI measures 2.62 cm. Aortic valve mean gradient measures 8.0 mmHg. Aortic valve Vmax measures 2.00 m/s.  6. The inferior vena cava is normal in size with greater than 50% respiratory variability, suggesting right atrial pressure of 3 mmHg. FINDINGS  Left Ventricle: Left ventricular ejection fraction, by estimation, is 25 to 30%. The left ventricle has severely decreased function. The left ventricle demonstrates global hypokinesis. The left ventricular internal cavity size was mildly dilated. There is no left  ventricular hypertrophy. Left ventricular diastolic parameters are indeterminate. Right Ventricle: The right ventricular size is normal. No increase in right ventricular wall thickness. Right ventricular systolic function is mildly reduced. There is mildly elevated pulmonary artery systolic pressure. The tricuspid regurgitant velocity  is 2.95 m/s, and with an assumed right atrial pressure of 3 mmHg, the estimated right ventricular systolic pressure is 37.8 mmHg. Left Atrium: Left atrial size was mildly dilated. Right Atrium: Right atrial size was normal in size. Pericardium: There is no evidence of pericardial effusion. Mitral Valve: The mitral valve is normal in structure. Mild mitral annular calcification. Mild to moderate mitral valve regurgitation. MV peak gradient, 10.0 mmHg. The mean mitral valve gradient is 4.0 mmHg. Tricuspid Valve: The tricuspid valve is normal in structure. Tricuspid valve regurgitation is trivial. Aortic Valve: The aortic valve has been repaired/replaced. Aortic valve regurgitation is not visualized. Aortic valve mean gradient measures 8.0 mmHg. Aortic valve peak gradient measures 16.0 mmHg. Aortic valve area, by VTI measures 2.62 cm. There is a 29 mm Sapien prosthetic, stented (TAVR) valve present in the aortic position. Pulmonic Valve: The pulmonic valve was normal in structure. Pulmonic valve regurgitation is not visualized. Aorta: The aortic root is normal in size and structure. Venous: The inferior vena cava is normal in size with greater than 50% respiratory variability, suggesting right atrial pressure of 3 mmHg. IAS/Shunts: The interatrial septum was not well visualized.  LEFT VENTRICLE PLAX 2D LVIDd:         6.20 cm      Diastology LVIDs:         5.80 cm      LV e' medial:    14.90 cm/s LV PW:         0.80 cm      LV E/e' medial:  9.6 LV IVS:        1.00 cm      LV e' lateral:   10.70 cm/s LVOT diam:     2.10 cm      LV E/e' lateral: 13.4 LV SV:  96 LV SV Index:   49  LVOT Area:     3.46 cm  LV Volumes (MOD) LV vol d, MOD A2C: 402.0 ml LV vol d, MOD A4C: 302.0 ml LV vol s, MOD A2C: 222.0 ml LV vol s, MOD A4C: 184.0 ml LV SV MOD A2C:     180.0 ml LV SV MOD A4C:     302.0 ml LV SV MOD BP:      154.8 ml RIGHT VENTRICLE            IVC RV Basal diam:  4.70 cm    IVC diam: 2.00 cm RV S prime:     7.72 cm/s RVOT diam:      3.40 cm TAPSE (M-mode): 1.7 cm RVSP:           37.8 mmHg LEFT ATRIUM             Index        RIGHT ATRIUM           Index LA diam:        4.00 cm 2.05 cm/m   RA Pressure: 3.00 mmHg LA Vol (A2C):   63.6 ml 32.61 ml/m  RA Area:     16.20 cm LA Vol (A4C):   54.8 ml 28.10 ml/m  RA Volume:   37.20 ml  19.07 ml/m LA Biplane Vol: 59.1 ml 30.30 ml/m  AORTIC VALVE                     PULMONIC VALVE AV Area (Vmax):    2.55 cm      PV Vmax:       1.36 m/s AV Area (Vmean):   2.19 cm      PV Peak grad:  7.5 mmHg AV Area (VTI):     2.62 cm AV Vmax:           200.00 cm/s AV Vmean:          132.000 cm/s AV VTI:            0.368 m AV Peak Grad:      16.0 mmHg AV Mean Grad:      8.0 mmHg LVOT Vmax:         147.00 cm/s LVOT Vmean:        83.500 cm/s LVOT VTI:          0.278 m LVOT/AV VTI ratio: 0.76  AORTA Ao Root diam: 3.20 cm Ao Asc diam:  3.80 cm MITRAL VALVE                TRICUSPID VALVE MV Area (PHT): 3.93 cm     TR Peak grad:   34.8 mmHg MV Area VTI:   2.29 cm     TR Vmax:        295.00 cm/s MV Peak grad:  10.0 mmHg    Estimated RAP:  3.00 mmHg MV Mean grad:  4.0 mmHg     RVSP:           37.8 mmHg MV Vmax:       1.58 m/s MV Vmean:      87.3 cm/s    SHUNTS MV Decel Time: 193 msec     Systemic VTI:  0.28 m MR Peak grad: 83.9 mmHg     Systemic Diam: 2.10 cm MR Mean grad: 53.0 mmHg     Pulmonic Diam: 3.40 cm MR Vmax:      458.00 cm/s MR Vmean:  343.0 cm/s MV E velocity: 143.00 cm/s MV A velocity: 112.00 cm/s MV E/A ratio:  1.28 Aditya Sabharwal Electronically signed by Ria Commander Signature Date/Time: 02/17/2024/5:20:06 PM    Final    Medications:   (feeding  supplement) PROSource Plus  30 mL Oral BID BM   sodium chloride    Intravenous Once   sodium chloride    Intravenous Once   amoxicillin -clavulanate  1 tablet Oral QHS   aspirin  EC  81 mg Oral Daily   atorvastatin   20 mg Oral Daily   B-complex with vitamin C  1 tablet Oral Daily   carvedilol   3.125 mg Oral QHS   Chlorhexidine  Gluconate Cloth  6 each Topical Q0600   cholecalciferol   1,000 Units Oral Daily   clopidogrel   75 mg Oral Daily   darbepoetin (ARANESP ) injection - DIALYSIS  100 mcg Subcutaneous Weekly   docusate sodium   100 mg Oral Daily   doxycycline   100 mg Oral Q12H   gabapentin   200 mg Oral QHS   isosorbide  mononitrate  30 mg Oral Daily   l-methylfolate-B6-B12  1 tablet Oral Daily   latanoprost   1 drop Both Eyes QHS   levothyroxine   50 mcg Oral Daily   nutrition supplement (JUVEN)  1 packet Oral BID BM   sevelamer  carbonate  800 mg Oral TID WC   sodium chloride  flush  5 mL Intracatheter Q8H    Dialysis Orders TTS [facility] 4hr, 350/800, EDW 81kg, 2K/2.5Ca, L AVF, no heparin  - no VDRA, ESA ordered but not yet given   Assessment/Plan: 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 Per VVS.  In CIR. Severe aortic stenosis s/p TAVR 01/20/24: Repeat echo 9/16 with persistent/worsened HF (EF 20-25%).  ESRD: HD TTS - for HD today. Vascular Access: S/p AVF infiltration and difficulty cannulating at his outpatient HD center. F'gram completed 9/8 - normal/patent AVF.  Needs expert cannulator with every treatment.   HTN/volume: BP in goal. Continue carvedilol  3.125mg  BID and Imdur  30mg  every day. S/p amputation, will need lower edw on discharge.  Anemia of ESRD: Hgb 8.8. S/p 1U PRBCs on 9/13. Continue Aranesp  100mcg q Friday. Secondary HPTH: CorrCa ok and phos ok- continue sevelamer  as binder. Nutrition: Alb low, continue supps. Hx cholecystitis with perc chole drain still in place: Erythema/purulence around drain. S/p tube exchange 9/2. Remains on  Augmentin . Repeat CT 9/14 without abscess or other changes to area, s/p exchange to larger tube 9/16. T2DM Dispo: In CIR, tentative d/c 9/19 per notes.   ADDENDUM 1517pm: Once again HD unit having issues cannulating his AVF despite multiple attempts. S/p patent/normal f'gram 10 days ago. Will send him back to room (no HD) and ice heavily tonight, bring back tomorrow AM and try again. If can't stick then, will need TDC placed. Pt aware of plan. [place]     Izetta Boehringer, PA-C 02/19/2024, 10:57 AM  [employer]

## 2024-02-19 NOTE — Progress Notes (Signed)
 Occupational Therapy Session Note  Patient Details  Name: DEMPSY [name] MRN: [id number] Date of Birth: 26-Dec-1955  Today's Date: 02/19/2024 OT Individual Time: 0903-1000 OT Individual Time Calculation (min): 57 min    Short Term Goals: Week 2:  OT Short Term Goal 1 (Week 2): LTG=STG d/t ELOS  Skilled Therapeutic Interventions/Progress Updates:  Skilled OT session completed to address functional transfers, discharge planning and HEP. Pt received seated in WC, agreeable to participate in therapy. Pt reports no pain.  OT provided education on POC and services provided in CIR, prompted to recall safety awareness techniques for transfers within the home. Pt successfully verbalizes WC position, no standing to reduce risk for falls, and limb loss management principles with independence. Pt also recalls pt binder with resources for limb loss management. Pt simulates UB dressing and LB dressing with theraband, completes with supervision. Pt completes don/doffing of footwear with supervision seated in WC. Upon doffing footwear, Pt checks skin, blister noted on second toe, nursing notified and treated in room. Pt requires increased encouragement to perform activities at highest level of independence. OT reinforces that pt is capable of supervision level tasks and wife will be within the home to provide verbal cues, pt verbalizes understanding. The following UB HEP provided to increase strength and activity tolerance: - Seated Shoulder Horizontal Abduction with Resistance  - 1 x daily - 7 x weekly - 3 sets - 10 reps - Seated Shoulder Diagonal Pulls with Resistance  - 1 x daily - 7 x weekly - 3 sets - 10 reps - Seated Shoulder Flexion with Self-Anchored Resistance  - 1 x daily - 7 x weekly - 3 sets - 10 reps - Seated Shoulder Extension with Self-Anchored Resistance  - 1 x daily - 7 x weekly - 3 sets - 10 reps - Seated Elbow Flexion with Self-Anchored Resistance  - 1 x daily - 7 x weekly - 3 sets - 10  reps - Seated Elbow Extension with Self-Anchored Resistance  - 1 x daily - 7 x weekly - 3 sets - 15 reps - Seated Shoulder Abduction with Self-Anchored Resistance  - 1 x daily - 7 x weekly - 3 sets - 10 reps  Resistance provided with green theraband. Pt self-guides through HEP with handout displaying proper positioning and technique.   Pt self-propels to bathroom, manages WC arms/legs with increased time prior to transfer. Pt successfully positions WC displaying good safety awareness. WC>B-DABSC lateral scoot with supervision. Pt weight shifts to don/doff clothing with VC for positioning. B-DABSC>WC lateral scoot with supervision. Pt returns to room seated in [facility] with all needs met.   Therapy Documentation Precautions:  Precautions Precautions: Fall Recall of Precautions/Restrictions: Intact Precaution/Restrictions Comments: R chole drain, L BKA Required Braces or Orthoses: Other Brace Other Brace: limb guard when OOB Restrictions Weight Bearing Restrictions Per Provider Order: Yes LLE Weight Bearing Per Provider Order: Non weight bearing ADL: ADL Eating: Supervision/safety Where Assessed-Eating: Bed level Grooming: Setup Where Assessed-Grooming: Chair, Sitting at sink Upper Body Bathing: Minimal assistance Where Assessed-Upper Body Bathing: Sitting at sink Lower Body Bathing: Maximal assistance Where Assessed-Lower Body Bathing: Sitting at sink Upper Body Dressing: Minimal assistance Where Assessed-Upper Body Dressing: Chair Lower Body Dressing: Maximal assistance Where Assessed-Lower Body Dressing: Chair Toileting: Maximal assistance Where Assessed-Toileting: Teacher, adult education: Maximal Dentist Method: Squat pivot, Scientist, research (life sciences): Bedside commode Vision Baseline Vision/History: 1 Wears glasses Vision Assessment?: No apparent visual deficits Perception  Perception: Within Functional Limits Praxis Praxis:  [facility] Balance Balance Balance Assessed: Yes Static Sitting Balance Static Sitting - Balance Support: Feet supported;Bilateral upper extremity supported Static Sitting - Level of Assistance: 7: Independent Dynamic Sitting Balance Dynamic Sitting - Balance Support: Feet supported;No upper extremity supported Dynamic Sitting - Level of Assistance: 6: Modified independent (Device/Increase time) Static Standing Balance Static Standing - Balance Support: Bilateral upper extremity supported;During functional activity (RW) Static Standing - Level of Assistance: 5: Stand by assistance (CGA) Static Standing - Comment/# of Minutes: ~15 seconds   Therapy/Group: Individual Therapy  Arden Axon Woods-Chance, MS, OTR/L 02/19/2024, 7:48 AM

## 2024-02-19 NOTE — Progress Notes (Signed)
 Physical Therapy Session Note  Patient Details  Name: Phillip [name] MRN: [id number] Date of Birth: 1955-10-22  Today's Date: 02/19/2024 PT Individual Time: 1000-1030 PT Individual Time Calculation (min): 30 min   Short Term Goals: Week 1:  PT Short Term Goal 1 (Week 1): Pt will perform basic transfers with min A PT Short Term Goal 1 - Progress (Week 1): Met PT Short Term Goal 2 (Week 1): Pt will be able to perform sit <> stands with min A PT Short Term Goal 2 - Progress (Week 1): Progressing toward goal PT Short Term Goal 3 (Week 1): Pt will be able to initiate gait PT Short Term Goal 3 - Progress (Week 1): Progressing toward goal Week 2:  PT Short Term Goal 1 (Week 2): STG=LTG due to LOS  Skilled Therapeutic Interventions/Progress Updates:    Discussed upcoming d/c. Pt seems a little down today, states he feels eh about going home tomorrow. Denies any specific concerns. Has frustrations with dialysis. Took patient outside for mood improvement and community mobility reintegration. Focused on w/c mobility over uneven surfaces on concrete, bricked surfaces and slopes with focus on functional UE strengthening and endurance. Cues and education provided on technique. Education on energy conservation techniques as well. Returned back to room total A due to fatigue. Set up with all needs in reach.   Therapy Documentation Precautions:  Precautions Precautions: Fall Recall of Precautions/Restrictions: Intact Precaution/Restrictions Comments: R chole drain, L BKA Required Braces or Orthoses: Other Brace Other Brace: limb guard when OOB Restrictions Weight Bearing Restrictions Per Provider Order: Yes LLE Weight Bearing Per Provider Order: Non weight bearing   Pain: Premedicated per report for LLE. Did not rate. Repositioned and rest breaks as needed.    Therapy/Group: Individual Therapy  [name] Elnor, PT, DPT, CBIS  02/19/2024, 11:41 AM

## 2024-02-19 NOTE — Progress Notes (Signed)
 Occupational Therapy Discharge Summary  Patient Details  Name: Phillip [name] MRN: [id number] Date of Birth: 12/14/1955  Date of Discharge from OT service:February 19, 2024  Patient has met 8 of 8 long term goals due to improved activity tolerance, improved balance, postural control, improved awareness, and improved coordination.  Pt performs eating, UB bathing/dressing with Mod-I, LB dressing with supervision d/t increased fear of falling when performing task. Pt requires encouragement to perform tasks at highest level of independence possible. Family and pt have been provided education from OT services on pacing during activity to prevent frustration. Education also provided on transfers, DME, and limb loss education. Patient to discharge at overall Supervision level.  Patient's care partner is independent to provide the necessary physical assistance at discharge; care partner does provide physical care for live-in grandson as well.     Recommendation:  Patient will benefit from ongoing skilled OT services in home health setting to continue to advance functional skills in the area of BADL and iADL to increase independence with functional transfers, LB dressing, and continue limb loss education. Increased fear of falling contributes to deficits.   Equipment: B-DABSC and TTB.  Reasons for discharge: treatment goals met and discharge from hospital  Patient/family agrees with progress made and goals achieved: Yes  OT Discharge Precautions/Restrictions  Precautions Precautions: Fall Recall of Precautions/Restrictions: Intact Precaution/Restrictions Comments: R chole drain, L BKA Required Braces or Orthoses: Other Brace Other Brace: limb guard when OOB Restrictions Weight Bearing Restrictions Per Provider Order: Yes LLE Weight Bearing Per Provider Order: Non weight bearing Pain Pain Assessment Pain Scale: 0-10 Faces Pain Scale: No hurt ADL ADL Eating: Modified independent Where  Assessed-Eating: Chair Grooming: Modified independent Where Assessed-Grooming: Sitting at sink, Wheelchair Upper Body Bathing: Modified independent Where Assessed-Upper Body Bathing: Sitting at sink, Wheelchair Lower Body Bathing: Modified independent Where Assessed-Lower Body Bathing: Sitting at sink, Wheelchair Upper Body Dressing: Modified independent (Device) Where Assessed-Upper Body Dressing: Sitting at sink, Wheelchair Lower Body Dressing: Supervision/safety Where Assessed-Lower Body Dressing: Sitting at sink, Wheelchair Toileting: Supervision/safety Where Assessed-Toileting: Bedside Commode, Teacher, adult education: Close supervision Toilet Transfer Method: Sit pivot (lateral scoot) Acupuncturist: Extra wide bedside commode Tub/Shower Transfer: Close supervison Web designer Method: Sit pivot Tub/Shower Equipment: Sales promotion account executive Baseline Vision/History: 1 Wears glasses Vision Assessment?: No apparent visual deficits Perception  Perception: Within Functional Limits Praxis Praxis: WFL Cognition Cognition Overall Cognitive Status: Within Functional Limits for tasks assessed Arousal/Alertness: Awake/alert Orientation Level: Person;Place;Situation Memory: Impaired Memory Impairment: Decreased short term memory;Decreased recall of new information;Retrieval deficit Decreased Short Term Memory: Functional complex;Verbal complex Sustained Attention: Appears intact Awareness: Appears intact Problem Solving: Impaired Safety/Judgment: Appears intact Brief Interview for Mental Status (BIMS) Repetition of Three Words (First Attempt): 3 Temporal Orientation: Year: Correct Temporal Orientation: Month: Accurate within 5 days Temporal Orientation: Day: Correct Recall: Sock: Yes, no cue required Recall: Blue: Yes, no cue required Recall: Bed: Yes, after cueing (a piece of furniture) BIMS Summary Score: 14 Sensation Sensation Light Touch:  Impaired Detail Hot/Cold: Not tested Proprioception: Appears Intact Stereognosis: Not tested Additional Comments: decreased sensation along R great toe Coordination Gross Motor Movements are Fluid and Coordinated: No Fine Motor Movements are Fluid and Coordinated: Yes Coordination and Movement Description: altered balance strategies due to L BKA, global weakness/deconditioning, and fear of falling Finger Nose Finger Test: [facility] bilaterally Heel Shin Test: unable to perform on LLE due to BKA Motor  Motor Motor: Abnormal postural alignment and control Motor - Skilled Clinical Observations:  altered balance strategies due to L BKA - limited by global weakness/deconditioning and fear of falling Mobility  Bed Mobility Bed Mobility: Rolling Right;Rolling Left;Sit to Supine;Supine to Sit Rolling Right: Independent with assistive device Rolling Left: Independent with assistive device Supine to Sit: Independent with assistive device Sit to Supine: Independent with assistive device Transfers Sit to Stand: Minimal Assistance - Patient > 75%  Trunk/Postural Assessment  Cervical Assessment Cervical Assessment: Exceptions to [facility] (forward head) Thoracic Assessment Thoracic Assessment: Exceptions to [facility] (rounded shoulders) Lumbar Assessment Lumbar Assessment: Exceptions to [facility] (posterior pelvic tilt) Postural Control Postural Control: Deficits on evaluation Trunk Control: posterior bias in standing Righting Reactions: delayed and inadequate on L due to BKA Protective Responses: delayed and inadequate on L due to BKA  Balance Balance Balance Assessed: Yes Static Sitting Balance Static Sitting - Balance Support: Feet supported;Bilateral upper extremity supported Static Sitting - Level of Assistance: 7: Independent Dynamic Sitting Balance Dynamic Sitting - Balance Support: Feet supported;No upper extremity supported Dynamic Sitting - Level of Assistance: 6: Modified independent (Device/Increase  time) Dynamic Sitting - Balance Activities: Forward lean/weight shifting;Lateral lean/weight shifting;Reaching for objects Static Standing Balance Static Standing - Balance Support: Bilateral upper extremity supported;During functional activity Static Standing - Level of Assistance: 5: Stand by assistance Static Standing - Comment/# of Minutes: ~15 seconds Dynamic Standing Balance Dynamic Standing - Level of Assistance: Not tested (comment) Extremity/Trunk Assessment RUE Assessment RUE Assessment: Within Functional Limits LUE Assessment LUE Assessment: Within Functional Limits   Wen Merced Woods-Chance, MS, OTR/L 02/19/2024, 10:14 AM

## 2024-02-19 NOTE — Plan of Care (Signed)
  Problem: RH Eating Goal: LTG Patient will perform eating w/assist, cues/equip (OT) Description: LTG: Patient will perform eating with assist, with/without cues using equipment (OT) Outcome: Completed/Met   Problem: RH Grooming Goal: LTG Patient will perform grooming w/assist,cues/equip (OT) Description: LTG: Patient will perform grooming with assist, with/without cues using equipment (OT) Outcome: Completed/Met   Problem: RH Bathing Goal: LTG Patient will bathe all body parts with assist levels (OT) Description: LTG: Patient will bathe all body parts with assist levels (OT) Outcome: Completed/Met   Problem: RH Dressing Goal: LTG Patient will perform upper body dressing (OT) Description: LTG Patient will perform upper body dressing with assist, with/without cues (OT). Outcome: Completed/Met Goal: LTG Patient will perform lower body dressing w/assist (OT) Description: LTG: Patient will perform lower body dressing with assist, with/without cues in positioning using equipment (OT) Outcome: Completed/Met   Problem: RH Toileting Goal: LTG Patient will perform toileting task (3/3 steps) with assistance level (OT) Description: LTG: Patient will perform toileting task (3/3 steps) with assistance level (OT)  Outcome: Completed/Met   Problem: RH Toilet Transfers Goal: LTG Patient will perform toilet transfers w/assist (OT) Description: LTG: Patient will perform toilet transfers with assist, with/without cues using equipment (OT) Outcome: Completed/Met   Problem: RH Tub/Shower Transfers Goal: LTG Patient will perform tub/shower transfers w/assist (OT) Description: LTG: Patient will perform tub/shower transfers with assist, with/without cues using equipment (OT) Outcome: Completed/Met   Problem: RH Grooming Goal: LTG Patient will perform grooming w/assist,cues/equip (OT) Description: LTG: Patient will perform grooming with assist, with/without cues using equipment (OT) Outcome:  Completed/Met   Problem: RH Bathing Goal: LTG Patient will bathe all body parts with assist levels (OT) Description: LTG: Patient will bathe all body parts with assist levels (OT) Outcome: Completed/Met   Problem: RH Dressing Goal: LTG Patient will perform upper body dressing (OT) Description: LTG Patient will perform upper body dressing with assist, with/without cues (OT). Outcome: Completed/Met Goal: LTG Patient will perform lower body dressing w/assist (OT) Description: LTG: Patient will perform lower body dressing with assist, with/without cues in positioning using equipment (OT) Outcome: Completed/Met   Problem: RH Toilet Transfers Goal: LTG Patient will perform toilet transfers w/assist (OT) Description: LTG: Patient will perform toilet transfers with assist, with/without cues using equipment (OT) Outcome: Completed/Met   Problem: RH Tub/Shower Transfers Goal: LTG Patient will perform tub/shower transfers w/assist (OT) Description: LTG: Patient will perform tub/shower transfers with assist, with/without cues using equipment (OT) Outcome: Completed/Met   Problem: RH Awareness Goal: LTG: Patient will demonstrate awareness during functional activites type of (OT) Description: LTG: Patient will demonstrate awareness during functional activites type of (OT) Outcome: Completed/Met

## 2024-02-19 NOTE — Progress Notes (Signed)
 Speech Language Pathology Discharge Summary  Patient Details  Name: Phillip [name] MRN: [id number] Date of Birth: [birth year]  Date of Discharge from SLP service:February 19, 2024  Today's Date: 02/19/2024 SLP Individual Time: 1100-1156 SLP Individual Time Calculation (min): 56 min  Skilled Therapeutic Interventions: SLP conducted skilled therapy session targeting cognition goals. Upon clinician arrival, patient resistant to speech therapy treatment. Through continuous verbal encouragement and education re: importance of enhancing cognitive skills for discharge, patient agreeable. SLP facilitated functional monetary task involving real bills/coins to target working memory and problem solving. Patient required minA to recall exact amount of money verbally provided and count exact change for 10/10 trials. SLP prompted patient to write down larger sums of money to increase retention. Patient exhibited difficulty with counting appropriate amount of bills as complexity increased, despite him writing down the exact amount. Remaining session used for functional communication and discussing patient discharge. Patient identified sources of anxiety re: return home, exhibiting good anticipatory awareness of potential in-home challenges in light of new medical situation, acknowledging that level of independence will change post CIR. Patient was left in room with call bell in reach and alarm set. Patient is appropriate for discharge from SLP caseload. SLP will sign off.  Patient has met 2 of 2 long term goals.  Patient to discharge at [facility] level.  Reasons goals not met: n/a   Clinical Impression/Discharge Summary:  Patient has made steady progress across therapy stay, meeting 2/2 long term goals set for duration. Patient currently benefits from min assist for recall of daily events and for basic functional problem solving. He would benefit from ongoing SLP services targeting aforementioned lingering  deficits at next venue of care. Patient and family education complete. SLP will sign off.   Care Partner:  Caregiver Able to Provide Assistance: Yes  Type of Caregiver Assistance: Cognitive  Recommendation:  Outpatient SLP;24 hour supervision/assistance  Rationale for SLP Follow Up: Maximize cognitive function and independence   Equipment: n/a   Reasons for discharge: Discharged from hospital   Patient/Family Agrees with Progress Made and Goals Achieved: Yes   Rosina Downy, M.A., CCC-SLP   [name] 02/19/2024, 12:04 PM

## 2024-02-19 NOTE — Plan of Care (Signed)
°  Problem: RH Problem Solving °Goal: LTG Patient will demonstrate problem solving for (SLP) °Description: LTG:  Patient will demonstrate problem solving for basic/complex daily situations with cues  (SLP) °Outcome: Completed/Met °  °Problem: RH Memory °Goal: LTG Patient will use memory compensatory aids to (SLP) °Description: LTG:  Patient will use memory compensatory aids to recall biographical/new, daily complex information with cues (SLP) °Outcome: Completed/Met °  °

## 2024-02-19 NOTE — Progress Notes (Signed)
 PROGRESS NOTE   Subjective/Complaints: Continues to have bright red blood in cholecystostomy, Rolan will contact IR, asked nursing to hold plavix  today Does not want to do dialysis but is willing to when discussing that he cannot receive tomorrow due to d/c date    02/19/2024    3:08 AM 02/18/2024    7:31 PM 02/18/2024   12:58 PM  Vitals with BMI  Weight 156 lbs 15 oz    BMI 21.28    Systolic 121 124 97  Diastolic 66 69 54  Pulse 74 72 76   ROS: Patient denies fever, rash, sore throat, blurred vision, dizziness, nausea, vomiting, diarrhea, cough, shortness of breath or chest pain, back/neck pain, headache, or mood change. +fatigue, +bright red blood in ostomy  Objective:   ECHOCARDIOGRAM COMPLETE Result Date: 02/17/2024    ECHOCARDIOGRAM REPORT   Patient Name:   Phillip [name] Date of Exam: 02/17/2024 Medical Rec #:  [id number]        Height:       72.0 in Accession #:    [id number]       Weight:       162.5 lb Date of Birth:  04/16/1956       BSA:          1.950 m Patient Age:    68 years         BP:           130/50 mmHg Patient Gender: M                HR:           66 bpm. Exam Location:  Inpatient Procedure: 2D Echo, Cardiac Doppler and Color Doppler (Both Spectral and Color            Flow Doppler were utilized during procedure). Indications:    Post TAVR evaluation Z95.2  History:        Patient has prior history of Echocardiogram examinations, most                 recent 01/21/2024. CAD, Prior CABG, Arrythmias:Bradycardia; Risk                 Factors:Diabetes and Hypertension. H/O Hyperlipidemia, Edema,                 Weakness, Palpitations, Aortic Stenosis S/P TAVR, Pulmonary                 hypertension, End stage renal disease.                 Aortic Valve: 29 mm Sapien prosthetic, stented (TAVR) valve is                 present in the aortic position.  Sonographer:    BERNARDA ROCKS Referring Phys: KATHRYN R THOMPSON IMPRESSIONS   1. Left ventricular ejection fraction, by estimation, is 25 to 30%. The left ventricle has severely decreased function. The left ventricle demonstrates global hypokinesis. The left ventricular internal cavity size was mildly dilated. Left ventricular diastolic parameters are indeterminate.  2. Right ventricular systolic function is mildly reduced. The right ventricular size is normal. There  is mildly elevated pulmonary artery systolic pressure.  3. Left atrial size was mildly dilated.  4. The mitral valve is normal in structure. Mild to moderate mitral valve regurgitation.  5. The aortic valve has been repaired/replaced. Aortic valve regurgitation is not visualized. There is a 29 mm Sapien prosthetic (TAVR) valve present in the aortic position. Aortic valve area, by VTI measures 2.62 cm. Aortic valve mean gradient measures 8.0 mmHg. Aortic valve Vmax measures 2.00 m/s.  6. The inferior vena cava is normal in size with greater than 50% respiratory variability, suggesting right atrial pressure of 3 mmHg. FINDINGS  Left Ventricle: Left ventricular ejection fraction, by estimation, is 25 to 30%. The left ventricle has severely decreased function. The left ventricle demonstrates global hypokinesis. The left ventricular internal cavity size was mildly dilated. There is no left ventricular hypertrophy. Left ventricular diastolic parameters are indeterminate. Right Ventricle: The right ventricular size is normal. No increase in right ventricular wall thickness. Right ventricular systolic function is mildly reduced. There is mildly elevated pulmonary artery systolic pressure. The tricuspid regurgitant velocity  is 2.95 m/s, and with an assumed right atrial pressure of 3 mmHg, the estimated right ventricular systolic pressure is 37.8 mmHg. Left Atrium: Left atrial size was mildly dilated. Right Atrium: Right atrial size was normal in size. Pericardium: There is no evidence of pericardial effusion. Mitral Valve: The mitral  valve is normal in structure. Mild mitral annular calcification. Mild to moderate mitral valve regurgitation. MV peak gradient, 10.0 mmHg. The mean mitral valve gradient is 4.0 mmHg. Tricuspid Valve: The tricuspid valve is normal in structure. Tricuspid valve regurgitation is trivial. Aortic Valve: The aortic valve has been repaired/replaced. Aortic valve regurgitation is not visualized. Aortic valve mean gradient measures 8.0 mmHg. Aortic valve peak gradient measures 16.0 mmHg. Aortic valve area, by VTI measures 2.62 cm. There is a 29 mm Sapien prosthetic, stented (TAVR) valve present in the aortic position. Pulmonic Valve: The pulmonic valve was normal in structure. Pulmonic valve regurgitation is not visualized. Aorta: The aortic root is normal in size and structure. Venous: The inferior vena cava is normal in size with greater than 50% respiratory variability, suggesting right atrial pressure of 3 mmHg. IAS/Shunts: The interatrial septum was not well visualized.  LEFT VENTRICLE PLAX 2D LVIDd:         6.20 cm      Diastology LVIDs:         5.80 cm      LV e' medial:    14.90 cm/s LV PW:         0.80 cm      LV E/e' medial:  9.6 LV IVS:        1.00 cm      LV e' lateral:   10.70 cm/s LVOT diam:     2.10 cm      LV E/e' lateral: 13.4 LV SV:         96 LV SV Index:   49 LVOT Area:     3.46 cm  LV Volumes (MOD) LV vol d, MOD A2C: 402.0 ml LV vol d, MOD A4C: 302.0 ml LV vol s, MOD A2C: 222.0 ml LV vol s, MOD A4C: 184.0 ml LV SV MOD A2C:     180.0 ml LV SV MOD A4C:     302.0 ml LV SV MOD BP:      154.8 ml RIGHT VENTRICLE            IVC RV Basal diam:  4.70  cm    IVC diam: 2.00 cm RV S prime:     7.72 cm/s RVOT diam:      3.40 cm TAPSE (M-mode): 1.7 cm RVSP:           37.8 mmHg LEFT ATRIUM             Index        RIGHT ATRIUM           Index LA diam:        4.00 cm 2.05 cm/m   RA Pressure: 3.00 mmHg LA Vol (A2C):   63.6 ml 32.61 ml/m  RA Area:     16.20 cm LA Vol (A4C):   54.8 ml 28.10 ml/m  RA Volume:   37.20 ml   19.07 ml/m LA Biplane Vol: 59.1 ml 30.30 ml/m  AORTIC VALVE                     PULMONIC VALVE AV Area (Vmax):    2.55 cm      PV Vmax:       1.36 m/s AV Area (Vmean):   2.19 cm      PV Peak grad:  7.5 mmHg AV Area (VTI):     2.62 cm AV Vmax:           200.00 cm/s AV Vmean:          132.000 cm/s AV VTI:            0.368 m AV Peak Grad:      16.0 mmHg AV Mean Grad:      8.0 mmHg LVOT Vmax:         147.00 cm/s LVOT Vmean:        83.500 cm/s LVOT VTI:          0.278 m LVOT/AV VTI ratio: 0.76  AORTA Ao Root diam: 3.20 cm Ao Asc diam:  3.80 cm MITRAL VALVE                TRICUSPID VALVE MV Area (PHT): 3.93 cm     TR Peak grad:   34.8 mmHg MV Area VTI:   2.29 cm     TR Vmax:        295.00 cm/s MV Peak grad:  10.0 mmHg    Estimated RAP:  3.00 mmHg MV Mean grad:  4.0 mmHg     RVSP:           37.8 mmHg MV Vmax:       1.58 m/s MV Vmean:      87.3 cm/s    SHUNTS MV Decel Time: 193 msec     Systemic VTI:  0.28 m MR Peak grad: 83.9 mmHg     Systemic Diam: 2.10 cm MR Mean grad: 53.0 mmHg     Pulmonic Diam: 3.40 cm MR Vmax:      458.00 cm/s MR Vmean:     343.0 cm/s MV E velocity: 143.00 cm/s MV A velocity: 112.00 cm/s MV E/A ratio:  1.28 Aditya Sabharwal Electronically signed by Ria Commander Signature Date/Time: 02/17/2024/5:20:06 PM    Final      Recent Labs    02/17/24 1259 02/18/24 1029  WBC 4.9  --   HGB 7.6* 8.8*  HCT 23.8* 28.1*  PLT 47*  --    Recent Labs    02/17/24 1259  NA 132*  K 4.6  CL 98  CO2 26  GLUCOSE 163*  BUN 57*  CREATININE 6.57*  CALCIUM  8.7*    Intake/Output Summary (Last 24 hours) at 02/19/2024 1007 Last data filed at 02/19/2024 0808 Gross per 24 hour  Intake 700 ml  Output --  Net 700 ml        Physical Exam: Vital Signs Blood pressure 121/66, pulse 74, temperature 98.7 F (37.1 C), resp. rate 18, height 6' (1.829 m), weight 71.2 kg, SpO2 100%.  Constitutional: No distress . Vital signs reviewed.  Sitting upright in bed. HEENT: NCAT, EOMI, oral membranes  moist Neck: supple Cardiovascular: RRR without murmur. No JVD    Respiratory/Chest: CTA Bilaterally without wheezes or rales. Normal effort    GI/Abdomen: BS +, non-tender, non-distended, perc chole tube Ext: no clubbing, cyanosis, or edema Psych: pleasant and cooperative  Skin: Left aVF, left BK with s/s drainage, gauze, ACE wrap in place + Left AV fistula, with palpable thrill + Left BKA site, with with clean overlying dressing, shrinker on + Right flank biliary tube with thin, milky drainage around wound, no drainage in bulb.  No expressible pus.  Mild surrounding erythema, no warmth or tenderness.  Neuro: Alert and oriented to person, place, reasons, month/year.  Speech clearer. Cranial nerves II through XII grossly intact. Bilateral upper extremity strength 5 out of 5. Right lower extremity strength 4 out of 5 proximal, 5 out of 5 distal. Left lower extremity at least 4-5, stable 9/18 Sensation intact light touch in all extremities    Assessment/Plan: 1. Functional deficits which require 3+ hours per day of interdisciplinary therapy in a comprehensive inpatient rehab setting. Physiatrist is providing close team supervision and 24 hour management of active medical problems listed below. Physiatrist and rehab team continue to assess barriers to discharge/monitor patient progress toward functional and medical goals  Care Tool:  Bathing    Body parts bathed by patient: Right arm, Left arm, Chest, Abdomen, Front perineal area, Buttocks, Right upper leg, Left upper leg, Face   Body parts bathed by helper: Right lower leg Body parts n/a: Left lower leg   Bathing assist Assist Level: Minimal Assistance - Patient > 75%     Upper Body Dressing/Undressing Upper body dressing   What is the patient wearing?: Pull over shirt    Upper body assist Assist Level: Set up assist    Lower Body Dressing/Undressing Lower body dressing      What is the patient wearing?: Underwear/pull up,  Pants, Ace wrap/stump shrinker     Lower body assist Assist for lower body dressing: Moderate Assistance - Patient 50 - 74%     Toileting Toileting    Toileting assist Assist for toileting: Maximal Assistance - Patient 25 - 49%     Transfers Chair/bed transfer  Transfers assist     Chair/bed transfer assist level: Supervision/Verbal cueing (slideboard)     Locomotion Ambulation   Ambulation assist   Ambulation activity did not occur: Safety/medical concerns (decreased balance, weakness, fear)          Walk 10 feet activity   Assist  Walk 10 feet activity did not occur: Safety/medical concerns (decreased balance, weakness, fear)        Walk 50 feet activity   Assist Walk 50 feet with 2 turns activity did not occur: Safety/medical concerns (decreased balance, weakness, fear)         Walk 150 feet activity   Assist Walk 150 feet activity did not occur: Safety/medical concerns (decreased balance, weakness, fear)         Walk 10 feet on uneven surface  activity   Assist Walk 10 feet on uneven surfaces activity did not occur: Safety/medical concerns (decreased balance, weakness, fear)         Wheelchair     Assist Is the patient using a wheelchair?: Yes Type of Wheelchair: Manual    Wheelchair assist level: Independent Max wheelchair distance: 150'    Wheelchair 50 feet with 2 turns activity    Assist        Assist Level: Independent   Wheelchair 150 feet activity     Assist      Assist Level: Independent   Blood pressure 121/66, pulse 74, temperature 98.7 F (37.1 C), resp. rate 18, height 6' (1.829 m), weight 71.2 kg, SpO2 100%.  Medical Problem List and Plan: 1. Functional deficits secondary to left BKA 01/30/2024 after failed TMA with history of PAD status post PCI to left tibia/fibula angioplasty              -patient may  shower             -ELOS/Goals: 7 days with mod I goals for mobility and  self-care  Chart and therapy notes reviewed, continue CIR, discussed patient's progress with therapy Grounds pass ordered Vitamin D3/Metanx/Vitamin B/C complex ordered F/u with me or Fidela in clinic within 1 month Would benefit from home health aide upon discharge   Should have cardiology follow-up within 1 week because cardiology medications have been changed due to hypotension  Encouraged wife to work on ramp for home  2.  Anemia: Hgb reviewed and has improved to 8.8, heparin  d/ced after discussion with IR due to bright red blood in cholesytotomy, stool occult ordered and is negative, continue aranesp , plavix  held this morning             -antiplatelet therapy: Aspirin  81 mg daily   3. Pain Management: decrease oxycodone  to 5mg  q12h given hypotension/fatigue Continue gabapentin   4. Insomnia: continue prn melatonin  5. Neuropsych/cognition: This patient is capable of making decisions on his own behalf.  -9/7pt appears clearer today. See above  6. Chole site irritation/yellow drainage: consulted IR - Recent acute cholecystitis June 2025.  Currently maintained on Augmentin  and linezolid  for 3 to 4 weeks and will confirm duration of antibiotic.  Radiology following  for percutaneous cholecystostomy tube for recent cholecystitis. Consulted again today regarding bright red blood in ostomy  7. Fluids/Electrolytes/Nutrition: Routine in and out with follow-up chemistries  8.  Aortic stenosis.  Status post TAVR 01/20/2024  9.  End-stage renal disease.  Continue hemodialysis as per renal services             -HD at end of day to allow participation in therapy  - HD T TH S  10.  Hyperlipidemia.  Lipitor  11.  Hypotension: coreg  decreased to HS, imdur  decreased by nephrology to 30mg  daily.  Monitor with increased mobility  - 9-13: Blood pressure appears improved with decreased Imdur .    02/19/2024    3:08 AM 02/18/2024    7:31 PM 02/18/2024   12:58 PM  Vitals with BMI  Weight 156 lbs 15  oz    BMI 21.28    Systolic 121 124 97  Diastolic 66 69 54  Pulse 74 72 76   12.  Hypothyroidism.  continue Synthroid   13.  Acute on chronic anemia: see #2  14.  CAD with history of CABG.  No chest pain or shortness of breath.  Continue Imdur   15.  BPH: d/c flomax  given hypotension, resolved  16.  Chronic diastolic congestive heart failure.  Monitor for any signs of fluid overload.  Follow-up per cardiology services.             -daily weights             -volume mgt with HD, weight reviewed and is stable Filed Weights   02/17/24 1440 02/18/24 0600 02/19/24 0308  Weight: 73.7 kg 72.5 kg 71.2 kg    17.  Constipation: Last BM documented 9/17    LOS: 14 days A FACE TO FACE EVALUATION WAS PERFORMED  Daysean Tinkham P Vernal Hritz 02/19/2024, 10:07 AM

## 2024-02-19 NOTE — Progress Notes (Signed)
 Patient ID: Phillip [name], male   DOB: 28-Jul-1955, 68 y.o.   MRN: [id number] Supplies for flushing chole tube and dressing change provided to patient and wife for discharge. [name]

## 2024-02-19 NOTE — Progress Notes (Signed)
 Physical Therapy Session Note  Patient Details  Name: Phillip [name] MRN: [id number] Date of Birth: [birth year]  Today's Date: 02/19/2024 PT Individual Time: 9266-9158 PT Individual Time Calculation (min): 68 min   Short Term Goals: Week 1:  PT Short Term Goal 1 (Week 1): Pt will perform basic transfers with min A PT Short Term Goal 1 - Progress (Week 1): Met PT Short Term Goal 2 (Week 1): Pt will be able to perform sit <> stands with min A PT Short Term Goal 2 - Progress (Week 1): Progressing toward goal PT Short Term Goal 3 (Week 1): Pt will be able to initiate gait PT Short Term Goal 3 - Progress (Week 1): Progressing toward goal Week 2:  PT Short Term Goal 1 (Week 2): STG=LTG due to LOS  Skilled Therapeutic Interventions/Progress Updates:   Received pt sitting EOB, pt agreeable to PT treatment, and reported pain 6/10 in L residual limb (premedicated). Session with emphasis on discharge planning, functional mobility/transfers, generalized strengthening and endurance, and dynamic standing balance/coordination. Went through sensation, MMT, and pain interference questionnaire in preparation for discharge. Pt reported his family is getting ramp installed by Saturday. Pt removed dirty shrinker independently and therapist donned clean 3XL shrinker with total A. Donned R shoe with max A and pt donned limb guard sitting EOB with supervision.  Pt transferred bed<>WC via slideboard with supervision (pt able to place/remove board without assist with ++ time). Set pt up at sink to brush teeth with setup assist. Pt then performed WC mobility 157ft using BUE and mod I to main therapy gym with emphasis on UE strength/coordination. Pt removed legrests with supervision and able to setup transfer to/from mat with supervision (assist to place board due to Roho cushion). Attempted x 3 to stand from low sitting EOM, but pt unable despite max/total A. Raised mat to 23in and stood with RW and max A fading to mod A  x 3 trials with cues for anterior weight shifting and upright posture/gaze. In standing, pt performed the following exercises with emphasis on LE strength/ROM: -L hip flexion x10 -L hip abduction x10 -R heel raises x10 (decreased ROM) Transitioned to the following seated exercises with emphasis on LE strength/ROM: -R knee extension 2x20 with 2.5lb ankle weight -R hip flexion 2x20 with 2.5lb ankle weight -hip abduction with grn TB 2x20 -hip adduction ball squeezes 10x10 second hold Transported back to room and hand washed shrinker and laid to air dry. Concluded session with pt sitting in [facility] with all needs within reach.   Therapy Documentation Precautions:  Precautions Precautions: Fall Recall of Precautions/Restrictions: Intact Precaution/Restrictions Comments: R chole drain, L BKA Required Braces or Orthoses: Other Brace Other Brace: limb guard Restrictions Weight Bearing Restrictions Per Provider Order: No LLE Weight Bearing Per Provider Order: Partial weight bearing  Therapy/Group: Individual Therapy [name] Therisa Stains PT, DPT 02/19/2024, 7:14 AM

## 2024-02-19 NOTE — Progress Notes (Signed)
 Inpatient Rehabilitation Discharge Medication Review by a Pharmacist  A complete drug regimen review was completed for this patient to identify any potential clinically significant medication issues.  High Risk Drug Classes Is patient taking? Indication by Medication  Antipsychotic No   Anticoagulant No   Antibiotic Yes Augmentin , doxycycline  po - gangrene  Opioid Yes Oxycodone  prn pain  Antiplatelet Yes Clopidogrel , aspirin - PAD  Hypoglycemics/insulin  No   Vasoactive Medication Yes Carvedilol , Imdur  - CAD  Chemotherapy No   Other Yes Atorvastatin  - HLD Gabapentin  - neuropathy Latanoprost  - glaucoma Levothyroxine  - low thyroid   Nitroglycerin  prn CP Sevelamer  - ESRD Vitamin D  - bones     Type of Medication Issue Identified Description of Issue Recommendation(s)  Drug Interaction(s) (clinically significant)     Duplicate Therapy     Allergy     No Medication Administration End Date     Incorrect Dose     Additional Drug Therapy Needed     Significant med changes from prior encounter (inform family/care partners about these prior to discharge).    Other       Clinically significant medication issues were identified that warrant physician communication and completion of prescribed/recommended actions by midnight of the next day:  No  Name of provider notified for urgent issues identified:   Provider Method of Notification:     Pharmacist comments: None  Time spent performing this drug regimen review (minutes):  20 minutes  Thank you. Olam Monte, PharmD

## 2024-02-19 NOTE — Progress Notes (Signed)
 Informed of potential d/c for tomorrow. Contacted out-pt HD unit, [facility], to inform of d/c date and anticipated arrival Saturday, for the time being. Will continue to monitor and fax info when applicable.   Lavanda Nathaly Dawkins Dialysis Navigator [phone]

## 2024-02-19 NOTE — Progress Notes (Signed)
 Physical Therapy Discharge Summary  Patient Details  Name: Phillip [name] MRN: [id number] Date of Birth: 1955-10-15  Date of Discharge from PT service:February 19, 2024  Patient has met 6 of 7 long term goals due to improved activity tolerance, improved balance, improved postural control, increased strength, increased range of motion, decreased pain, ability to compensate for deficits, improved awareness, and improved coordination. Patient to discharge at a wheelchair level Supervision. Patient's care partner is independent to provide the necessary physical and cognitive assistance at discharge.  Reasons goals not met: Pt did not meet dynamic standing goal of CGA as pt currently requires min A for dynamic standing balance due to weakness/deconditioning and decreased balance/coordination.   Recommendation:  Patient will benefit from ongoing skilled PT services in home health setting to continue to advance safe functional mobility, address ongoing impairments in transfers, generalized strengthening and endurance, dynamic standing balance/coordination, limb loss education, gait training, and to minimize fall risk.  Equipment: 18x18 manual WC with bilateral elevating legrests, slideboard  Reasons for discharge: treatment goals met and discharge from hospital  Patient/family agrees with progress made and goals achieved: Yes  PT Discharge Precautions/Restrictions Precautions Precautions: Fall Precaution/Restrictions Comments: R chole drain, L BKA Required Braces or Orthoses: Other Brace Other Brace: limb guard when OOB Restrictions Weight Bearing Restrictions Per Provider Order: Yes LLE Weight Bearing Per Provider Order: Non weight bearing Pain Interference Pain Interference Pain Effect on Sleep: 4. Almost constantly Pain Interference with Therapy Activities: 1. Rarely or not at all Pain Interference with Day-to-Day Activities: 2. Occasionally Cognition Overall Cognitive Status:  Within Functional Limits for tasks assessed Arousal/Alertness: Awake/alert Orientation Level: Oriented X4 Memory: Impaired Awareness: Appears intact Problem Solving: Impaired Safety/Judgment: Appears intact Sensation Sensation Light Touch: Impaired Detail Hot/Cold: Not tested Proprioception: Appears Intact Stereognosis: Not tested Additional Comments: decreased sensation along R great toe Coordination Gross Motor Movements are Fluid and Coordinated: No Fine Motor Movements are Fluid and Coordinated: No Coordination and Movement Description: altered balance strategies due to L BKA, global weakness/deconditioning, and fear of falling Finger Nose Finger Test: [facility] bilaterally Heel Shin Test: unable to perform on LLE due to BKA Motor  Motor Motor: Abnormal postural alignment and control Motor - Skilled Clinical Observations: altered balance strategies due to L BKA - limited by global weakness/deconditioning and fear of falling  Mobility Bed Mobility Bed Mobility: Rolling Right;Rolling Left;Sit to Supine;Supine to Sit Rolling Right: Independent with assistive device Rolling Left: Independent with assistive device Supine to Sit: Independent with assistive device Sit to Supine: Independent with assistive device Transfers Transfers: Sit to Stand;Lateral/Scoot Transfers Sit to Stand: Minimal Assistance - Patient > 75% Lateral/Scoot Transfers: Supervision/Verbal cueing (slideboard) Transfer (Assistive device): Rolling walker Locomotion  Gait Ambulation: No Gait Gait: No Stairs / Additional Locomotion Stairs: No Wheelchair Mobility Wheelchair Mobility: Yes Wheelchair Assistance: Independent with Scientist, research (life sciences): Both upper extremities Wheelchair Parts Management: Supervision/cueing Distance: 166ft  Trunk/Postural Assessment  Cervical Assessment Cervical Assessment: Exceptions to [facility] (forward head) Thoracic Assessment Thoracic Assessment: Exceptions to  [facility] (rounded shoulders) Lumbar Assessment Lumbar Assessment: Exceptions to [facility] (posterior pelvic tilt) Postural Control Postural Control: Deficits on evaluation Trunk Control: posterior bias in standing Righting Reactions: delayed and inadequate on L due to BKA Protective Responses: delayed and inadequate on L due to BKA  Balance Balance Balance Assessed: Yes Static Sitting Balance Static Sitting - Balance Support: Feet supported;Bilateral upper extremity supported Static Sitting - Level of Assistance: 7: Independent Dynamic Sitting Balance Dynamic Sitting - Balance Support: Feet  supported;No upper extremity supported Dynamic Sitting - Level of Assistance: 6: Modified independent (Device/Increase time) Static Standing Balance Static Standing - Balance Support: Bilateral upper extremity supported;During functional activity (RW) Static Standing - Level of Assistance: 5: Stand by assistance (CGA) Static Standing - Comment/# of Minutes: ~15 seconds Extremity Assessment  RLE Assessment RLE Assessment: Exceptions to [facility] General Strength Comments: tested sitting EOB RLE Strength Right Hip Flexion: 4-/5 Right Hip ABduction: 4-/5 Right Hip ADduction: 4-/5 Right Knee Flexion: 3+/5 Right Knee Extension: 4-/5 Right Ankle Dorsiflexion: 4/5 Right Ankle Plantar Flexion: 4/5 LLE Assessment LLE Assessment: Exceptions to [facility] General Strength Comments: tested sitting EOB - limited by pain LLE Strength Left Hip Flexion: 4-/5 Left Hip Extension: 3/5 Left Hip ABduction: 3+/5 Left Hip ADduction: 3+/5 Left Knee Flexion: 3/5   Sari Cogan M Zaunegger Therisa Stains PT, DPT 02/19/2024, 7:19 AM

## 2024-02-20 LAB — CBC
HCT: 24.8 % — ABNORMAL LOW (ref 39.0–52.0)
Hemoglobin: 7.8 g/dL — ABNORMAL LOW (ref 13.0–17.0)
MCH: 30.6 pg (ref 26.0–34.0)
MCHC: 31.5 g/dL (ref 30.0–36.0)
MCV: 97.3 fL (ref 80.0–100.0)
Platelets: 104 K/uL — ABNORMAL LOW (ref 150–400)
RBC: 2.55 MIL/uL — ABNORMAL LOW (ref 4.22–5.81)
RDW: 16.6 % — ABNORMAL HIGH (ref 11.5–15.5)
WBC: 4.9 K/uL (ref 4.0–10.5)
nRBC: 0 % (ref 0.0–0.2)

## 2024-02-20 LAB — RENAL FUNCTION PANEL
Albumin: 2.1 g/dL — ABNORMAL LOW (ref 3.5–5.0)
Anion gap: 8 (ref 5–15)
BUN: 56 mg/dL — ABNORMAL HIGH (ref 8–23)
CO2: 24 mmol/L (ref 22–32)
Calcium: 7.8 mg/dL — ABNORMAL LOW (ref 8.9–10.3)
Chloride: 105 mmol/L (ref 98–111)
Creatinine, Ser: 6.24 mg/dL — ABNORMAL HIGH (ref 0.61–1.24)
GFR, Estimated: 9 mL/min — ABNORMAL LOW (ref >60)
Glucose, Bld: 95 mg/dL (ref 70–99)
Phosphorus: 4.7 mg/dL — ABNORMAL HIGH (ref 2.5–4.6)
Potassium: 4.4 mmol/L (ref 3.5–5.1)
Sodium: 137 mmol/L (ref 135–145)

## 2024-02-20 MED ORDER — HEPARIN SODIUM (PORCINE) 1000 UNIT/ML IJ SOLN
INTRAMUSCULAR | Status: AC
  Filled 2024-02-20: qty 2

## 2024-02-20 MED ORDER — LIDOCAINE HCL (PF) 1 % IJ SOLN: 5.0000 mL | INTRAMUSCULAR | Status: DC | PRN

## 2024-02-20 MED ORDER — HEPARIN SODIUM (PORCINE) 1000 UNIT/ML DIALYSIS: 1000.0000 [IU] | INTRAMUSCULAR | Status: DC | PRN

## 2024-02-20 MED ORDER — ACETAMINOPHEN 325 MG PO TABS
ORAL_TABLET | ORAL | Status: AC
  Filled 2024-02-20: qty 2

## 2024-02-20 MED ORDER — PENTAFLUOROPROP-TETRAFLUOROETH EX AERO: 1.0000 | INHALATION_SPRAY | CUTANEOUS | Status: DC | PRN

## 2024-02-20 MED ORDER — LIDOCAINE-PRILOCAINE 2.5-2.5 % EX CREA: 1.0000 | TOPICAL_CREAM | CUTANEOUS | Status: DC | PRN

## 2024-02-20 MED ORDER — HEPARIN SODIUM (PORCINE) 1000 UNIT/ML DIALYSIS
2000.0000 [IU] | Freq: Once | INTRAMUSCULAR | Status: AC
  Administered 2024-02-20: 2000 [IU] via INTRAVENOUS_CENTRAL

## 2024-02-20 NOTE — Progress Notes (Deleted)
 According to MD pt will discharge 9/21 due to issues with HD. Have messaged Abigail pack-liaison to let know change in discharge date. Will let know co-pay for equipment and see once returns to rehab unit

## 2024-02-20 NOTE — Plan of Care (Signed)
  Problem: Consults Goal: RH LIMB LOSS PATIENT EDUCATION Description: Description: See Patient Education module for eduction specifics. Outcome: Progressing Goal: Skin Care Protocol Initiated - if Braden Score 18 or less Description: If consults are not indicated, leave blank or document N/A Outcome: Progressing Goal: Nutrition Consult-if indicated Outcome: Progressing Goal: Diabetes Guidelines if Diabetic/Glucose > 140 Description: If diabetic or lab glucose is > 140 mg/dl - Initiate Diabetes/Hyperglycemia Guidelines & Document Interventions  Outcome: Progressing   Problem: RH BOWEL ELIMINATION Goal: RH STG MANAGE BOWEL WITH ASSISTANCE Description: STG Manage Bowel with Assistance. Outcome: Progressing Goal: RH STG MANAGE BOWEL W/MEDICATION W/ASSISTANCE Description: STG Manage Bowel with Medication with Assistance. Outcome: Progressing   Problem: RH BLADDER ELIMINATION Goal: RH STG MANAGE BLADDER WITH ASSISTANCE Description: STG Manage Bladder With Assistance Outcome: Progressing Goal: RH STG MANAGE BLADDER WITH MEDICATION WITH ASSISTANCE Description: STG Manage Bladder With Medication With Assistance. Outcome: Progressing Goal: RH STG MANAGE BLADDER WITH EQUIPMENT WITH ASSISTANCE Description: STG Manage Bladder With Equipment With Assistance Outcome: Progressing   Problem: RH SKIN INTEGRITY Goal: RH STG SKIN FREE OF INFECTION/BREAKDOWN Outcome: Progressing Goal: RH STG MAINTAIN SKIN INTEGRITY WITH ASSISTANCE Description: STG Maintain Skin Integrity With Assistance. Outcome: Progressing Goal: RH STG ABLE TO PERFORM INCISION/WOUND CARE W/ASSISTANCE Description: STG Able To Perform Incision/Wound Care With Assistance. Outcome: Progressing   Problem: RH SAFETY Goal: RH STG ADHERE TO SAFETY PRECAUTIONS W/ASSISTANCE/DEVICE Description: STG Adhere to Safety Precautions With Assistance/Device. Outcome: Progressing Goal: RH STG DECREASED RISK OF FALL WITH  ASSISTANCE Description: STG Decreased Risk of Fall With Assistance. Outcome: Progressing   Problem: RH PAIN MANAGEMENT Goal: RH STG PAIN MANAGED AT OR BELOW PT'S PAIN GOAL Outcome: Progressing   Problem: RH KNOWLEDGE DEFICIT LIMB LOSS Goal: RH STG INCREASE KNOWLEDGE OF SELF CARE AFTER LIMB LOSS Outcome: Progressing   Problem: RH Pre-functional/Other (Specify) Goal: RH LTG Pre-functional (Specify) Outcome: Progressing Goal: RH LTG Interdisciplinary (Specify) 1 Description: RH LTG Interdisciplinary (Specify)1 Outcome: Progressing Goal: RH LTG Interdisciplinary (Specify) 2 Description: RH LTG Interdisciplinary (Specify) 2 Outcome: Progressing

## 2024-02-20 NOTE — Progress Notes (Addendum)
 Interventional Radiology Brief Note:  IR consulted for possible tunneled HD catheter in the setting of recent difficulty with fistula cannulation.  Patient was able to successfully undergo HD today via AVF.   Per Nephrology, no plans to pursue catheter today. Patient is planning for additional HD session prior to d/c tomorrow. Team aware that consideration for HD catheter placement can be revisited on Monday if new issues arise.   Shandy Checo, MS RD PA-C

## 2024-02-20 NOTE — Progress Notes (Signed)
 Unable to see patient today on rounds as is in HD, d/c date postponed to Sunday as needs HD today and tomorrow

## 2024-02-20 NOTE — Progress Notes (Signed)
 Patient ID: Phillip [name], male   DOB: 11-11-1955, 68 y.o.   MRN: [id number]  This SW covering for primary Di'Asia Loreli.   SW received updates moving pt d/c date to Sunday due to dialysis today and tomorrow.   SW wen to meet with pt in dialysis and he is adamant about going home. Upset about his course of hospital stay here. SW made efforts to explain, but he is not responsive at this time.   1048- SW spoke with pt wife to discuss above. She reports she spoke with him and encouraged him to remain here as deck is not finished yet. States to o by and talk to him later. SW discussed DME copay needed. States unsure if they will be able to purchase as he does not get SSI until the 10/1. SW shared copay for DME items- w/c, bariatric DABSC (private pay), TTB; will need to decide if they want bariatric DABSC or DABSC. SW gave contact info for [employer].   *SW went by pt room to discuss staying until Sunday. He is in agreement. SW shared above reports about DME and wife's concerns about being able to pay. States his wife will be here shortly.   SW went by room and pt wife not present.   1614-SW called pt wife on home phone to follow-up about DME but home voicemail is full and unable to leave message. SW called wife on cell phone and no answer continued to ring. Unsure if pt will have DME upon discharge.   Phillip [name], MSW, LCSW Office: [phone] Cell: [phone] Fax: [phone]

## 2024-02-20 NOTE — Progress Notes (Signed)
 [facility] Progress Note   Subjective:  Seen in [facility]. Team able to cannulate fistula today. Running at reduced BFR. No new events overnight. No complaints this am. Reports plans for discharge today. He's ready to go home.   Objective Vitals:   02/20/24 0800 02/20/24 0819 02/20/24 0822 02/20/24 0840  BP: 122/67 119/62 127/67 120/63  Pulse: 64 65 63 (!) 58  Resp: (!) 9 11 12 12   Temp:      TempSrc:      SpO2:   100% 100%  Weight:      Height:       Physical Exam General: Well appearing man, NAD. Room air Heart: RRR Lungs: CTAB Abdomen: soft, chole tube in place Extremities: Trace LE edema Dialysis Access:  LUE AVF +t/b, + bruising  Additional Objective Labs: Basic Metabolic Panel: Recent Labs  Lab 02/14/24 1430 02/17/24 1259 02/20/24 0730  NA 129* 132* 137  K 4.4 4.6 4.4  CL 92* 98 105  CO2 27 26 24   GLUCOSE 113* 163* 95  BUN 57* 57* 56*  CREATININE 5.56* 6.57* 6.24*  CALCIUM  8.3* 8.7* 7.8*  PHOS 3.1 2.7 4.7*   Liver Function Tests: Recent Labs  Lab 02/14/24 1430 02/17/24 1259 02/20/24 0730  ALBUMIN  2.1* 2.2* 2.1*   CBC: Recent Labs  Lab 02/14/24 1438 02/15/24 0542 02/16/24 0603 02/17/24 1259 02/18/24 1029 02/20/24 0730  WBC 4.7  --  4.4 4.9  --  4.9  HGB 6.8*   < > 8.3* 7.6* 8.8* 7.8*  HCT 22.1*   < > 26.1* 23.8* 28.1* 24.8*  MCV 96.9  --  94.9 94.8  --  97.3  PLT 38*  --  34* 47*  --  104*   < > = values in this interval not displayed.   Studies/Results: No results found.  Medications:   (feeding supplement) PROSource Plus  30 mL Oral BID BM   sodium chloride    Intravenous Once   sodium chloride    Intravenous Once   amoxicillin -clavulanate  1 tablet Oral QHS   aspirin  EC  81 mg Oral Daily   atorvastatin   20 mg Oral Daily   B-complex with vitamin C  1 tablet Oral Daily   carvedilol   3.125 mg Oral QHS   Chlorhexidine  Gluconate Cloth  6 each Topical Q0600   cholecalciferol   1,000 Units Oral Daily   clopidogrel   75 mg Oral  Daily   darbepoetin (ARANESP ) injection - DIALYSIS  100 mcg Subcutaneous Weekly   docusate sodium   100 mg Oral Daily   doxycycline   100 mg Oral Q12H   gabapentin   200 mg Oral QHS   heparin   2,000 Units Dialysis Once in dialysis   isosorbide  mononitrate  30 mg Oral Daily   l-methylfolate-B6-B12  1 tablet Oral Daily   latanoprost   1 drop Both Eyes QHS   levothyroxine   50 mcg Oral Daily   nutrition supplement (JUVEN)  1 packet Oral BID BM   sevelamer  carbonate  800 mg Oral TID WC   sodium chloride  flush  5 mL Intracatheter Q8H    Dialysis Orders TTS [facility] 4hr, 350/800, EDW 81kg, 2K/2.5Ca, L AVF, no heparin  - no VDRA, ESA ordered but not yet given   Assessment/Plan: 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 Per VVS.  In CIR. Severe aortic stenosis s/p TAVR 01/20/24: Repeat echo 9/16 with persistent/worsened HF (EF 20-25%).  ESRD: HD TTS - for HD today (off  schedule)  Vascular Access: S/p AVF infiltration and difficulty cannulating at his outpatient HD center. F'gram completed 9/8 - normal/patent AVF.  Needs expert cannulator with every treatment.   HTN/volume: BP in goal. Continue carvedilol  3.125mg  BID and Imdur  30mg  every day. S/p amputation, will need lower edw on discharge.  Anemia of ESRD: Hgb 8.8. S/p 1U PRBCs on 9/13. Continue Aranesp  100mcg q Friday. Secondary HPTH: CorrCa ok and phos ok- continue sevelamer  as binder. Nutrition: Alb low, continue supps. Hx cholecystitis with perc chole drain still in place: Erythema/purulence around drain. S/p tube exchange 9/2. Remains on Augmentin . Repeat CT 9/14 without abscess or other changes to area, s/p exchange to larger tube 9/16. T2DM Dispo: In CIR, tentative d/c 9/19 per notes.   Maisie Ronnald Acosta PA-C [facility] 02/20/2024,9:00 AM

## 2024-02-20 NOTE — Progress Notes (Signed)
 Contacted by SW regarding new d/c date. Contacted out-pt HD clinic [facility] and informed at this time that this pt will be d/c Sunday and will return to the clinic on Tuesday, following his normal out-patient schedule. Navigator will fax over d/c summary and last note on Monday when applicable.   Lavanda Mylasia Vorhees Dialysis Navigator [phone]

## 2024-02-21 NOTE — Progress Notes (Signed)
 PROGRESS NOTE   Subjective/Complaints:  Phillip [name] doing ok, slept ok, pain well managed, LBM yesterday per Phillip [name] but not documented since 9/17, urinating her his usual, has dialysis again today. Plan for d/c tomorrow. No other complaints or concerns.    ROS: as per HPI. Denies CP, SOB, abd pain, N/V/D/C, or any other complaints at this time.   +fatigue, +bright red blood in ostomy  Objective:   No results found.    Recent Labs    02/20/24 0730  WBC 4.9  HGB 7.8*  HCT 24.8*  PLT 104*   Recent Labs    02/20/24 0730  NA 137  K 4.4  CL 105  CO2 24  GLUCOSE 95  BUN 56*  CREATININE 6.24*  CALCIUM  7.8*    Intake/Output Summary (Last 24 hours) at 02/21/2024 1159 Last data filed at 02/20/2024 1800 Gross per 24 hour  Intake 210 ml  Output 32.4 ml  Net 177.6 ml        Physical Exam: Vital Signs Blood pressure 120/70, pulse 79, temperature 98.4 F (36.9 C), temperature source Oral, resp. rate 18, height 6' (1.829 m), weight 71.6 kg, SpO2 100%.  Constitutional: No distress . Vital signs reviewed.  Sitting upright in w/c working with OT. HEENT: NCAT, EOMI, oral membranes moist Neck: supple Cardiovascular: RRR with ?murmur. No JVD    Respiratory/Chest: CTA Bilaterally without wheezes or rales. Normal effort    GI/Abdomen: BS +, non-tender, non-distended, soft, perc chole tube with greenish brown liquid inside bag Ext: no clubbing, cyanosis, or edema Psych: pleasant and cooperative   PRIOR EXAMS: Skin: Left aVF, left BK with s/s drainage, gauze, ACE wrap in place + Left AV fistula, with palpable thrill + Left BKA site, with with clean overlying dressing, shrinker on + Right flank biliary tube with thin, milky drainage around wound, no drainage in bulb.  No expressible pus.  Mild surrounding erythema, no warmth or tenderness.  Neuro: Alert and oriented to person, place, reasons, month/year.  Speech clearer. Cranial nerves  II through XII grossly intact. Bilateral upper extremity strength 5 out of 5. Right lower extremity strength 4 out of 5 proximal, 5 out of 5 distal. Left lower extremity at least 4-5, stable 9/18 Sensation intact light touch in all extremities    Assessment/Plan: 1. Functional deficits which require 3+ hours per day of interdisciplinary therapy in a comprehensive inpatient rehab setting. Physiatrist is providing close team supervision and 24 hour management of active medical problems listed below. Physiatrist and rehab team continue to assess barriers to discharge/monitor patient progress toward functional and medical goals  Care Tool:  Bathing    Body parts bathed by patient: Right arm, Left arm, Chest, Abdomen, Front perineal area, Buttocks, Right upper leg, Left upper leg, Face, Right lower leg   Body parts bathed by helper: Right lower leg Body parts n/a: Left lower leg   Bathing assist Assist Level: Independent with assistive device     Upper Body Dressing/Undressing Upper body dressing   What is the patient wearing?: Pull over shirt    Upper body assist Assist Level: Independent with assistive device    Lower Body Dressing/Undressing Lower body dressing  What is the patient wearing?: Underwear/pull up, Pants, Ace wrap/stump shrinker     Lower body assist Assist for lower body dressing: Supervision/Verbal cueing Assistive Device Comment: increased time   Toileting Toileting    Toileting assist Assist for toileting: Supervision/Verbal cueing     Transfers Chair/bed transfer  Transfers assist     Chair/bed transfer assist level: Supervision/Verbal cueing     Locomotion Ambulation   Ambulation assist   Ambulation activity did not occur: Safety/medical concerns (fatigue, pain, decreased balance)          Walk 10 feet activity   Assist  Walk 10 feet activity did not occur: Safety/medical concerns (fatigue, pain, decreased balance)         Walk 50 feet activity   Assist Walk 50 feet with 2 turns activity did not occur: Safety/medical concerns (fatigue, pain, decreased balance)         Walk 150 feet activity   Assist Walk 150 feet activity did not occur: Safety/medical concerns (fatigue, pain, decreased balance)         Walk 10 feet on uneven surface  activity   Assist Walk 10 feet on uneven surfaces activity did not occur: Safety/medical concerns (fatigue, pain, decreased balance)         Wheelchair     Assist Is the patient using a wheelchair?: Yes Type of Wheelchair: Manual    Wheelchair assist level: Independent Max wheelchair distance: 150'    Wheelchair 50 feet with 2 turns activity    Assist        Assist Level: Independent   Wheelchair 150 feet activity     Assist      Assist Level: Independent   Blood pressure 120/70, pulse 79, temperature 98.4 F (36.9 C), temperature source Oral, resp. rate 18, height 6' (1.829 m), weight 71.6 kg, SpO2 100%.  Medical Problem List and Plan: 1. Functional deficits secondary to left BKA 01/30/2024 after failed TMA with history of PAD status post PCI to left tibia/fibula angioplasty              -patient may  shower             -ELOS/Goals: 7 days with mod I goals for mobility and self-care  Chart and therapy notes reviewed, continue CIR, discussed patient's progress with therapy Grounds pass ordered Vitamin D3/Metanx/Vitamin B/C complex ordered F/u with me or Fidela in clinic within 1 month Would benefit from home health aide upon discharge   Should have cardiology follow-up within 1 week because cardiology medications have been changed due to hypotension  Encouraged wife to work on ramp for home  -02/21/24 plan to d/c tomorrow  2.  Anemia: Hgb reviewed and has improved to 8.8, heparin  d/ced after discussion with IR due to bright red blood in cholesytotomy, stool occult ordered and is negative, continue aranesp , plavix  held this  morning             -antiplatelet therapy: Aspirin  81 mg daily   3. Pain Management: decrease oxycodone  to 5mg  q12h given hypotension/fatigue Continue gabapentin   4. Insomnia: continue prn melatonin  5. Neuropsych/cognition: This patient is capable of making decisions on his own behalf.  -9/7pt appears clearer today. See above  6. Chole site irritation/yellow drainage: consulted IR - Recent acute cholecystitis June 2025.  Currently maintained on Augmentin  and linezolid  for 3 to 4 weeks and will confirm duration of antibiotic.  Radiology following  for percutaneous cholecystostomy tube for recent cholecystitis. Consulted again today regarding  bright red blood in ostomy  7. Fluids/Electrolytes/Nutrition: Routine in and out with follow-up chemistries  8.  Aortic stenosis.  Status post TAVR 01/20/2024  9.  End-stage renal disease.  Continue hemodialysis as per renal services             -HD at end of day to allow participation in therapy  - HD T TH S  -02/21/24 dialysis today, d/c home tomorrow  10.  Hyperlipidemia.  Lipitor  11.  Hypotension: coreg  decreased to HS, imdur  decreased by nephrology to 30mg  daily.  Monitor with increased mobility  - 9-13: Blood pressure appears improved with decreased Imdur .    02/21/2024    5:00 AM 02/21/2024    4:57 AM 02/20/2024    8:12 PM  Vitals with BMI  Weight 157 lbs 14 oz    BMI 21.4    Systolic  120 105  Diastolic  70 60  Pulse  79 69   12.  Hypothyroidism.  continue Synthroid   13.  Acute on chronic anemia: see #2  14.  CAD with history of CABG.  No chest pain or shortness of breath.  Continue Imdur   15.  BPH: d/c flomax  given hypotension, resolved  16.  Chronic diastolic congestive heart failure.  Monitor for any signs of fluid overload.  Follow-up per cardiology services.             -daily weights             -volume mgt with HD, weight reviewed and is stable Filed Weights   02/20/24 0741 02/20/24 1159 02/21/24 0500  Weight: 73.1  kg 72.7 kg 71.6 kg    17.  Constipation: Last BM documented 9/17, but Phillip [name] states LBM 9/19; monitor today, if no BM then might need additional meds going home. On colace 100mg  daily    LOS: 16 days A FACE TO FACE EVALUATION WAS PERFORMED  [address] 02/21/2024, 11:59 AM

## 2024-02-21 NOTE — Progress Notes (Signed)
 [facility] Progress Note   Subjective:  Seen in hallway. Good spirtis today. For dialysis later today  Objective Vitals:   02/20/24 1350 02/20/24 2012 02/21/24 0457 02/21/24 0500  BP: 113/68 105/60 120/70   Pulse: 80 69 79   Resp: 17 18 18    Temp: (!) 97.5 F (36.4 C) 98.3 F (36.8 C) 98.4 F (36.9 C)   TempSrc: Oral Oral Oral   SpO2: 100% 100% 100%   Weight:    71.6 kg  Height:       Physical Exam General: Well appearing man, NAD. Room air Heart: RRR Lungs: CTAB Abdomen: soft, chole tube in place Extremities: Trace LE edema Dialysis Access:  LUE AVF +t/b, + bruising  Additional Objective Labs: Basic Metabolic Panel: Recent Labs  Lab 02/14/24 1430 02/17/24 1259 02/20/24 0730  NA 129* 132* 137  K 4.4 4.6 4.4  CL 92* 98 105  CO2 27 26 24   GLUCOSE 113* 163* 95  BUN 57* 57* 56*  CREATININE 5.56* 6.57* 6.24*  CALCIUM  8.3* 8.7* 7.8*  PHOS 3.1 2.7 4.7*   Liver Function Tests: Recent Labs  Lab 02/14/24 1430 02/17/24 1259 02/20/24 0730  ALBUMIN  2.1* 2.2* 2.1*   CBC: Recent Labs  Lab 02/14/24 1438 02/15/24 0542 02/16/24 0603 02/17/24 1259 02/18/24 1029 02/20/24 0730  WBC 4.7  --  4.4 4.9  --  4.9  HGB 6.8*   < > 8.3* 7.6* 8.8* 7.8*  HCT 22.1*   < > 26.1* 23.8* 28.1* 24.8*  MCV 96.9  --  94.9 94.8  --  97.3  PLT 38*  --  34* 47*  --  104*   < > = values in this interval not displayed.   Studies/Results: No results found.  Medications:   (feeding supplement) PROSource Plus  30 mL Oral BID BM   sodium chloride    Intravenous Once   sodium chloride    Intravenous Once   amoxicillin -clavulanate  1 tablet Oral QHS   aspirin  EC  81 mg Oral Daily   atorvastatin   20 mg Oral Daily   B-complex with vitamin C  1 tablet Oral Daily   carvedilol   3.125 mg Oral QHS   Chlorhexidine  Gluconate Cloth  6 each Topical Q0600   cholecalciferol   1,000 Units Oral Daily   clopidogrel   75 mg Oral Daily   darbepoetin (ARANESP ) injection - DIALYSIS  100  mcg Subcutaneous Weekly   docusate sodium   100 mg Oral Daily   doxycycline   100 mg Oral Q12H   gabapentin   200 mg Oral QHS   isosorbide  mononitrate  30 mg Oral Daily   l-methylfolate-B6-B12  1 tablet Oral Daily   latanoprost   1 drop Both Eyes QHS   levothyroxine   50 mcg Oral Daily   nutrition supplement (JUVEN)  1 packet Oral BID BM   sevelamer  carbonate  800 mg Oral TID WC   sodium chloride  flush  5 mL Intracatheter Q8H    Dialysis Orders TTS [facility] 4hr, 350/800, EDW 81kg, 2K/2.5Ca, L AVF, no heparin  - no VDRA, ESA ordered but not yet given   Assessment/Plan: 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 Per [facility].  In CIR. Severe aortic stenosis s/p TAVR 01/20/24: Repeat echo 9/16 with persistent/worsened HF (EF 20-25%).  ESRD: HD TTS - for HD today (off schedule)  Vascular Access: S/p AVF infiltration and difficulty cannulating at his outpatient HD center. F'gram completed 9/8 - normal/patent AVF.  Needs  expert cannulator with every treatment.   HTN/volume: BP in goal. Continue carvedilol  3.125mg  BID and Imdur  30mg  every day. S/p amputation, will need lower edw on discharge.  Anemia of ESRD: Hgb 7-8. S/p 1U PRBCs on 9/13. Continue Aranesp  100mcg q Friday. Secondary HPTH: CorrCa ok and phos ok- continue sevelamer  as binder. Nutrition: Alb low, continue supps. Hx cholecystitis with perc chole drain still in place: Erythema/purulence around drain. S/p tube exchange 9/2. Remains on Augmentin . Repeat CT 9/14 without abscess or other changes to area, s/p exchange to larger tube 9/16. T2DM Dispo: In CIR. For d/c 9/21   Maisie Ronnald Acosta PA-C [facility] 02/21/2024,11:34 AM

## 2024-02-21 NOTE — Progress Notes (Signed)
 Reevaluation for Physical Therapy Services   Patient Details  Name: Phillip [name] MRN: [id number] Date of Birth: [birth year]  Today's Date: 02/21/2024 PT Individual Time: 0715-0824 PT Individual Time Calculation (min): 69 min    PT Diagnosis: Abnormal posture, Abnormality of gait, Difficulty walking, Edema, Impaired sensation, Muscle weakness, and Pain in L residual limn  Pt did not discharge as planned on 9/19. Pt has been assessed and will continue with therapy services. Pt current dx is Principal Problem:   Left below-knee amputee [facility]) Active Problems:   Protein-calorie malnutrition, severe   Patient will benefit from skilled PT intervention to maximize safe functional mobility, minimize fall risk, and decrease caregiver burden for planned discharge home with 24 hour supervision.  Anticipate patient will benefit from follow up HH at discharge.  PT - End of Session Activity Tolerance: Tolerates 30+ min activity with multiple rests Endurance Deficit: Yes Endurance Deficit Description: fatigue PT Assessment Rehab Potential (ACUTE/IP ONLY): Good PT Barriers to Discharge: Hemodialysis;Other (comments) PT Barriers to Discharge Comments: pain PT Patient demonstrates impairments in the following area(s): Balance;Edema;Endurance;Pain;Skin Integrity PT Transfers Functional Problem(s): Bed Mobility;Bed to Chair;Car;Furniture PT Locomotion Functional Problem(s): Ambulation;Wheelchair Mobility;Stairs PT Plan PT Intensity: Minimum of 1-2 x/day ,45 to 90 minutes PT Frequency: 5 out of 7 days PT Duration Estimated Length of Stay: 1-2 days PT Treatment/Interventions: Ambulation/gait training;Balance/vestibular training;Community reintegration;Discharge planning;Disease management/prevention;DME/adaptive equipment instruction;Functional mobility training;Pain management;Patient/family education;Skin care/wound management;Therapeutic Activities;Therapeutic Exercise;UE/LE Strength  taining/ROM;UE/LE Coordination activities;Wheelchair propulsion/positioning;Neuromuscular re-education PT Transfers Anticipated Outcome(s): Mod I with lrad PT Locomotion Anticipated Outcome(s): N/A PT Recommendation Follow Up Recommendations: Home health PT Patient destination: Home Equipment Recommended: Wheelchair (measurements);Wheelchair cushion (measurements);Sliding board  Pain Interference Pain Interference Pain Effect on Sleep: 4. Almost constantly Pain Interference with Therapy Activities: 2. Occasionally Pain Interference with Day-to-Day Activities: 2. Occasionally  Care Tool Care Tool Bed Mobility Roll left and right activity   Roll left and right assist level: Independent    Sit to lying activity   Sit to lying assist level: Independent    Lying to sitting on side of bed activity   Lying to sitting on side of bed assist level: the ability to move from lying on the back to sitting on the side of the bed with no back support.: Independent     Care Tool Transfers Sit to stand transfer   Sit to stand assist level: Minimal Assistance - Patient > 75%    Chair/bed transfer   Chair/bed transfer assist level: Supervision/Verbal cueing    Car transfer   Car transfer assist level: Supervision/Verbal cueing (slideboard)      Care Tool Locomotion Ambulation Ambulation activity did not occur: Safety/medical concerns (fatigue, pain, decreased balance)        Walk 10 feet activity Walk 10 feet activity did not occur: Safety/medical concerns (fatigue, pain, decreased balance)       Walk 50 feet with 2 turns activity Walk 50 feet with 2 turns activity did not occur: Safety/medical concerns (fatigue, pain, decreased balance)      Walk 150 feet activity Walk 150 feet activity did not occur: Safety/medical concerns (fatigue, pain, decreased balance)      Walk 10 feet on uneven surfaces activity Walk 10 feet on uneven surfaces activity did not occur: Safety/medical concerns  (fatigue, pain, decreased balance)      Stairs Stair activity did not occur: Safety/medical concerns (fatigue, pain, decreased balance)        Walk up/down 1 step activity Walk up/down 1 step or curb (  drop down) activity did not occur: Safety/medical concerns (fatigue, pain, decreased balance)      Walk up/down 4 steps activity Walk up/down 4 steps activity did not occur: Safety/medical concerns (fatigue, pain, decreased balance)      Walk up/down 12 steps activity Walk up/down 12 steps activity did not occur: Safety/medical concerns (fatigue, pain, decreased balance)      Pick up small objects from floor Pick up small object from the floor (from standing position) activity did not occur: Safety/medical concerns (fatigue, pain, decreased balance)      Wheelchair Is the patient using a wheelchair?: Yes Type of Wheelchair: Manual   Wheelchair assist level: Independent Max wheelchair distance: 150'  Wheel 50 feet with 2 turns activity   Assist Level: Independent  Wheel 150 feet activity   Assist Level: Independent    Skilled Intervention: Evaluation completed (see details above and below) with education on PT POC and goals and individual treatment initiated with focus on functional mobility/transfers, generalized strengthening and endurance, and simulated car transfers. Received pt sitting in WC, pt agreeable to PT re-evaluation, and reported pain 7/10 in L residual limb (premedicated). Went through sensation, MMT, and pain interference questionnaire. Removed gauze and ace wrap from residual limb and donned 3XL shrinker with total A and limb guard mod I. Of note, pt with excellent recall of education that shrinker goes directly over incision and pointed out to therapist that it was incorrect.   Pt's equipment arrived - pt transferred WC<>WC via slideboard with supervision (assist to place and remove board) and education on use of brake extenders. Pt performed WC mobility 159ft using BUE  and mod I to main therapy gym with emphasis on UE strength/coordination. Added red TB to [facility] wheels for improved grip. Transported to ortho gym and performed simulated car transfer with supervision - pt able to set up [facility] for transfer and able to place board but required assist to remove.   Pt then transferred onto mat with supervision (able to place/remove board without assist) and removed limb guard. Pt performed the following exercises with emphasis on LE strength/ROM: -knee extension 2x15 bilaterally with 3lb ankle weight on RLE -hip flexion 2x15 bilaterally with 3lb ankle weight on RLE -hip abduction with grn TB 2x20 -R knee flexion with grn TB 2x15 Donned limb guard mod I, then transferred mat<>WC via slideboard with supervision. Transported back to room in [facility] dependently and concluded session with pt sitting in [facility] with all needs within reach.   Refer to Care Plan for Long Term Goals  The above assessment, treatment plan, treatment alternatives and goals were discussed and mutually agreed upon: by patient  Therisa CHRISTELLA Delorse Therisa Delorse PT, DPT 02/21/2024, 8:27 AM     Therisa CHRISTELLA Zaunegger 02/21/2024, 8:27 AM

## 2024-02-21 NOTE — Plan of Care (Signed)
  Problem: Consults Goal: RH LIMB LOSS PATIENT EDUCATION Description: Description: See Patient Education module for eduction specifics. Outcome: Progressing Goal: Skin Care Protocol Initiated - if Braden Score 18 or less Description: If consults are not indicated, leave blank or document N/A Outcome: Progressing Goal: Nutrition Consult-if indicated Outcome: Progressing Goal: Diabetes Guidelines if Diabetic/Glucose > 140 Description: If diabetic or lab glucose is > 140 mg/dl - Initiate Diabetes/Hyperglycemia Guidelines & Document Interventions  Outcome: Progressing   Problem: RH BOWEL ELIMINATION Goal: RH STG MANAGE BOWEL WITH ASSISTANCE Description: STG Manage Bowel with Assistance. Outcome: Progressing Goal: RH STG MANAGE BOWEL W/MEDICATION W/ASSISTANCE Description: STG Manage Bowel with Medication with Assistance. Outcome: Progressing   Problem: RH SKIN INTEGRITY Goal: RH STG SKIN FREE OF INFECTION/BREAKDOWN Outcome: Progressing Goal: RH STG MAINTAIN SKIN INTEGRITY WITH ASSISTANCE Description: STG Maintain Skin Integrity With Assistance. Outcome: Progressing Goal: RH STG ABLE TO PERFORM INCISION/WOUND CARE W/ASSISTANCE Description: STG Able To Perform Incision/Wound Care With Assistance. Outcome: Progressing   Problem: RH SAFETY Goal: RH STG ADHERE TO SAFETY PRECAUTIONS W/ASSISTANCE/DEVICE Description: STG Adhere to Safety Precautions With Assistance/Device. Outcome: Progressing Goal: RH STG DECREASED RISK OF FALL WITH ASSISTANCE Description: STG Decreased Risk of Fall With Assistance. Outcome: Progressing   Problem: RH KNOWLEDGE DEFICIT LIMB LOSS Goal: RH STG INCREASE KNOWLEDGE OF SELF CARE AFTER LIMB LOSS Outcome: Progressing

## 2024-02-21 NOTE — Progress Notes (Signed)
 Unable to cannulate AVF in dialysis today despite multiple attempts. Will not receive dialysis today. He has had ongoing difficulty using this fistula during this admission. At this point he agrees to placement of tunneled dialysis catheter. Per last discussion with IR -- earliest availability for catheter placement will be on Monday.   Maisie Fellows Eastyn Skalla PA-C 02/21/2024,3:17 PM

## 2024-02-21 NOTE — Progress Notes (Signed)
 Occupational Therapy Discharge Summary  Patient Details  Name: ZACHARIAS RIDLING MRN: [id number] Date of Birth: [birth year]  Date of Discharge from OT service:February 21, 2024  Patient has met 8 of 8 long term goals due to improved activity tolerance, improved balance, and ability to compensate for deficits.  Patient to discharge at overall Modified Independent level for UB care and supervision for LB care due to increased fear of falling when performing tasks. Patient's care partner is independent to provide the necessary physical and cognitive assistance at discharge.    Reasons goals not met: NA  Recommendation:  Patient will benefit from ongoing skilled OT services in home health setting to continue to advance functional skills in the area of BADL and Reduce care partner burden.  Equipment: Bariatric DABSC & TTB  Reasons for discharge: treatment goals met and discharge from hospital  Patient/family agrees with progress made and goals achieved: Yes  OT Discharge Precautions/Restrictions  Precautions Precautions: Fall Precaution/Restrictions Comments: R chole drain, L BKA Required Braces or Orthoses: Other Brace Other Brace: limb guard when OOB Restrictions Weight Bearing Restrictions Per Provider Order: Yes LLE Weight Bearing Per Provider Order: Non weight bearing ADL ADL Eating: Modified independent Where Assessed-Eating: Chair Grooming: Modified independent Where Assessed-Grooming: Sitting at sink, Wheelchair Upper Body Bathing: Modified independent Where Assessed-Upper Body Bathing: Sitting at sink, Wheelchair Lower Body Bathing: Modified independent Where Assessed-Lower Body Bathing: Sitting at sink, Wheelchair Upper Body Dressing: Modified independent (Device) Where Assessed-Upper Body Dressing: Sitting at sink, Wheelchair Lower Body Dressing: Supervision/safety Where Assessed-Lower Body Dressing: Sitting at sink, Wheelchair Toileting: Supervision/safety Where  Assessed-Toileting: Bedside Commode, Teacher, adult education: Close supervision Toilet Transfer Method: Sit pivot (lateral scoot) Acupuncturist: Extra wide bedside commode Tub/Shower Transfer: Close supervison Web designer Method: Sit pivot Tub/Shower Equipment: Sales promotion account executive Baseline Vision/History: 1 Wears glasses Vision Assessment?: No apparent visual deficits Perception  Perception: Within Functional Limits Praxis Praxis: WFL Cognition Cognition Overall Cognitive Status: Impaired/Different from baseline Arousal/Alertness: Awake/alert Memory: Impaired Awareness: Appears intact Problem Solving: Impaired Safety/Judgment: Appears intact Brief Interview for Mental Status (BIMS) Repetition of Three Words (First Attempt): 3 Temporal Orientation: Year: Correct Temporal Orientation: Month: Accurate within 5 days Temporal Orientation: Day: Correct Recall: Sock: Yes, no cue required Recall: Blue: Yes, no cue required Recall: Bed: No, could not recall BIMS Summary Score: 13 Sensation Sensation Light Touch: Impaired Detail Light Touch Impaired Details: Impaired RLE Hot/Cold: Not tested Proprioception: Appears Intact Stereognosis: Not tested Additional Comments: decreased sensation along R great toe, hypersensitivity along distal end of residual limb Coordination Gross Motor Movements are Fluid and Coordinated: No Fine Motor Movements are Fluid and Coordinated: Yes Coordination and Movement Description: altered balance strategies due to L BKA, global weakness/deconditioning, and fear of falling Finger Nose Finger Test: [facility] bilaterally Heel Shin Test: unable to perform on LLE due to BKA Motor  Motor Motor: Abnormal postural alignment and control Motor - Skilled Clinical Observations: altered balance strategies due to L BKA - limited by global weakness/deconditioning and fear of falling Mobility  Bed Mobility Bed Mobility: Rolling  Right;Rolling Left;Sit to Supine;Supine to Sit Rolling Right: Independent with assistive device Rolling Left: Independent with assistive device Supine to Sit: Independent with assistive device Sit to Supine: Independent with assistive device Transfers Sit to Stand: Minimal Assistance - Patient > 75% Lateral/Scoot Transfer: Supervision  Trunk/Postural Assessment  Cervical Assessment Cervical Assessment: Exceptions to [facility] (forward head) Thoracic Assessment Thoracic Assessment: Exceptions to [facility] (rounded shoulders) Lumbar Assessment Lumbar Assessment: Exceptions to  WFL (posterior pelvic tilt) Postural Control Postural Control: Deficits on evaluation Trunk Control: posterior bias in standing Righting Reactions: delayed and inadequate on L due to BKA Protective Responses: delayed and inadequate on L due to BKA  Balance Balance Balance Assessed: Yes Static Sitting Balance Static Sitting - Balance Support: Feet supported;Bilateral upper extremity supported Static Sitting - Level of Assistance: 7: Independent Dynamic Sitting Balance Dynamic Sitting - Balance Support: Feet supported;No upper extremity supported Dynamic Sitting - Level of Assistance: 6: Modified independent (Device/Increase time) Static Standing Balance Static Standing - Balance Support: Bilateral upper extremity supported;During functional activity Static Standing - Level of Assistance: 5: Stand by assistance (CGA) Static Standing - Comment/# of Minutes: ~15 seconds Extremity/Trunk Assessment RUE Assessment RUE Assessment: Within Functional Limits LUE Assessment LUE Assessment: Within Functional Limits  Nereida Habermann, OTR/L, MSOT  02/21/2024, 10:50 AM

## 2024-02-21 NOTE — Progress Notes (Signed)
 Physical Therapy Session Note  Patient Details  Name: Phillip [name] MRN: [id number] Date of Birth: [birth year]  Today's Date: 02/21/2024 PT Individual Time: 8883-8845 PT Individual Time Calculation (min): 38 min   Short Term Goals: Week 1:  PT Short Term Goal 1 (Week 1): STG=LTG due to LOS  Skilled Therapeutic Interventions/Progress Updates:   Received pt sitting in WC, pt agreeable to PT treatment, and did not state pain level during session but reported fatigue from previous therapies. Session with emphasis on functional mobility/transfers, generalized strengthening and endurance, and community navigation. Pt requested to go outside - transported outside to entrance of [facility] in WC dependently.   Pt performed WC mobility ~69ft using BUE and RLE with supervision over uneven surfaces (concrete) and up/down mild slopes to reflection fountain - limited by fatigue. Pt then performed the following seated exercises with emphasis on UE strength: -horizontal WC with grn TB 2x15 -bicep curls with grb TB 2x15 bilaterally -rows with grn TB 2x15  Returned to room and transferred into recliner using slideboard mod I. Concluded session with pt sitting in recliner with all needs within reach.   Therapy Documentation Precautions:  Precautions Precautions: Fall Recall of Precautions/Restrictions: Intact Precaution/Restrictions Comments: R chole drain, L BKA Required Braces or Orthoses: Other Brace Other Brace: limb guard when OOB Restrictions Weight Bearing Restrictions Per Provider Order: Yes LLE Weight Bearing Per Provider Order: Non weight bearing  Therapy/Group: Individual Therapy [name] Therisa Stains PT, DPT 02/21/2024, 6:51 AM

## 2024-02-21 NOTE — Progress Notes (Signed)
 Physical Therapy Discharge Summary  Patient Details  Name: Phillip [name] MRN: [id number] Date of Birth: 04-25-1956  Date of Discharge from PT service:February 21, 2024  Patient has met 1 of 1 long term goal due to improved activity tolerance, improved postural control, and increased strength. Patient to discharge at a wheelchair level Supervision. Patient's care partner is independent to provide the necessary physical assistance at discharge. Pt's wife attended family education training and verbalized and demonstrated confidence with all tasks to ensure safe discharge home.   Goal met  Recommendation:  Patient will benefit from ongoing skilled PT services in home health setting to continue to advance safe functional mobility, address ongoing impairments in transfers, generalized strengthening and endurance, dynamic standing balance/coordination, and to minimize fall risk.  Equipment: 18x18 manual WC with bilateral elevating legrests, slideboard  Reasons for discharge: treatment goals met and discharge from hospital  Patient/family agrees with progress made and goals achieved: Yes  PT Discharge Precautions/Restrictions Precautions Precautions: Fall Precaution/Restrictions Comments: R chole drain, L BKA Required Braces or Orthoses: Other Brace Other Brace: limb guard when OOB Restrictions Weight Bearing Restrictions Per Provider Order: Yes LLE Weight Bearing Per Provider Order: Non weight bearing Pain Interference Pain Interference Pain Effect on Sleep: 4. Almost constantly Pain Interference with Therapy Activities: 2. Occasionally Pain Interference with Day-to-Day Activities: 2. Occasionally Cognition Overall Cognitive Status: Impaired/Different from baseline Arousal/Alertness: Awake/alert Orientation Level: Oriented X4 Memory: Impaired Awareness: Appears intact Problem Solving: Impaired Safety/Judgment: Appears intact Sensation Sensation Light Touch: Impaired  Detail Light Touch Impaired Details: Impaired RLE Hot/Cold: Not tested Proprioception: Appears Intact Stereognosis: Not tested Additional Comments: decreased sensation along R great toe, hypersensitivity along distal end of residual limb Coordination Gross Motor Movements are Fluid and Coordinated: No Fine Motor Movements are Fluid and Coordinated: Yes Coordination and Movement Description: altered balance strategies due to L BKA, global weakness/deconditioning, and fear of falling Finger Nose Finger Test: [facility] bilaterally Heel Shin Test: unable to perform on LLE due to BKA Motor  Motor Motor: Abnormal postural alignment and control Motor - Skilled Clinical Observations: altered balance strategies due to L BKA - limited by global weakness/deconditioning and fear of falling  Mobility Bed Mobility Bed Mobility: Rolling Right;Rolling Left;Sit to Supine;Supine to Sit Rolling Right: Independent with assistive device Rolling Left: Independent with assistive device Supine to Sit: Independent with assistive device Sit to Supine: Independent with assistive device Transfers Transfers: Sit to Stand;Lateral/Scoot Transfers Sit to Stand: Minimal Assistance - Patient > 75% Lateral/Scoot Transfers: Independent with assistive device Transfer (Assistive device): Rolling walker Locomotion  Gait Ambulation: No Gait Gait: No Stairs / Additional Locomotion Stairs: No Wheelchair Mobility Wheelchair Mobility: Yes Wheelchair Assistance: Independent with Scientist, research (life sciences): Both upper extremities Wheelchair Parts Management: Supervision/cueing Distance: 183ft  Trunk/Postural Assessment  Cervical Assessment Cervical Assessment: Exceptions to [facility] (forward head) Thoracic Assessment Thoracic Assessment: Exceptions to [facility] (rounded shoulders) Lumbar Assessment Lumbar Assessment: Exceptions to [facility] (posterior pelvic tilt) Postural Control Postural Control: Deficits on  evaluation Trunk Control: posterior bias in standing Righting Reactions: delayed and inadequate on L due to BKA Protective Responses: delayed and inadequate on L due to BKA  Balance Balance Balance Assessed: Yes Static Sitting Balance Static Sitting - Balance Support: Feet supported;Bilateral upper extremity supported Static Sitting - Level of Assistance: 7: Independent Dynamic Sitting Balance Dynamic Sitting - Balance Support: Feet supported;No upper extremity supported Dynamic Sitting - Level of Assistance: 6: Modified independent (Device/Increase time) Static Standing Balance Static Standing - Balance Support: Bilateral upper  extremity supported;During functional activity Static Standing - Level of Assistance: 5: Stand by assistance (CGA) Static Standing - Comment/# of Minutes: ~15 seconds Extremity Assessment  RLE Assessment RLE Assessment: Exceptions to [facility] General Strength Comments: tested sitting EOB RLE Strength Right Hip Flexion: 4-/5 Right Hip ABduction: 4-/5 Right Hip ADduction: 4-/5 Right Knee Flexion: 3+/5 Right Knee Extension: 4-/5 Right Ankle Dorsiflexion: 4/5 Right Ankle Plantar Flexion: 4/5 LLE Assessment LLE Assessment: Exceptions to [facility] General Strength Comments: tested sitting EOB - limited by pain LLE Strength Left Hip Flexion: 4-/5 Left Hip Extension: 3/5 Left Hip ABduction: 3+/5 Left Hip ADduction: 3+/5 Left Knee Flexion: 3/5   Phillip [name] Phillip [name] Phillip [name] PT, DPT 02/21/2024, 6:57 AM

## 2024-02-21 NOTE — Plan of Care (Signed)
  Problem: RH Dressing Goal: LTG Patient will perform lower body dressing w/assist (OT) Description: LTG: Patient will perform lower body dressing with assist, with/without cues in positioning using equipment (OT) 02/21/2024 1056 by [name] Nereida HERO, OT Outcome: Completed/Met 02/21/2024 0656 by [name] Nereida HERO, OT Reactivated 02/21/2024 [phone] by [name] Nereida HERO, OT Reactivated

## 2024-02-21 NOTE — Plan of Care (Addendum)
 Reevaluation for Occupational Therapy Services    Patient Details  Name: Phillip [name] MRN: [id number] Date of Birth: [birth year]  Today's Date: 02/21/2024 OT Individual Time: 9054-8954 OT Individual Time Calculation (min): 60 min  and Today's Date: 02/21/2024 OT Missed Time: 10 Minutes Missed Time Reason: Other (comment) (Patient on phone with spouse.)    OT Diagnosis: abnormal posture, acute pain, cognitive deficits, muscle weakness (generalized), and decreased activity tolerance  Pt did not discharge as planned on 9/19. Pt has been assessed and will continue with therapy services. Pt current dx is Principal Problem:   Left below-knee amputee [facility]) Active Problems:   Protein-calorie malnutrition, severe .  Patient will benefit from skilled intervention to increase independence with basic self-care skills prior to discharge home with care partner.  Anticipate patient will require 24 hour supervision and follow up home health.  OT - End of Session Activity Tolerance: Tolerates 10 - 20 min activity with multiple rests Endurance Deficit: Yes Endurance Deficit Description: fatigue OT Assessment Rehab Potential (ACUTE ONLY): Fair OT Barriers to Discharge: Wound Care;Hemodialysis;Home environment access/layout OT Patient demonstrates impairments in the following area(s): Cognition;Endurance;Motor;Safety;Skin Integrity;Balance OT Basic ADL's Functional Problem(s): Grooming;Bathing;Dressing;Toileting OT Transfers Functional Problem(s): Toilet OT Additional Impairment(s): None OT Plan OT Intensity: Minimum of 1-2 x/day, 45 to 90 minutes OT Frequency: 5 out of 7 days OT Duration/Estimated Length of Stay: 10-12 days OT Treatment/Interventions: Discharge planning;Balance/vestibular training;Pain management;Self Care/advanced ADL retraining;Therapeutic Activities;Functional mobility training;Disease mangement/prevention;Cognitive remediation/compensation;UE/LE Coordination activities;Skin  care/wound managment;Therapeutic Exercise;DME/adaptive equipment instruction;Neuromuscular re-education;Psychosocial support;UE/LE Strength taining/ROM;Wheelchair propulsion/positioning;Patient/family education OT Self Feeding Anticipated Outcome(s): Supervision OT Basic Self-Care Anticipated Outcome(s): Supervision OT Toileting Anticipated Outcome(s): Supervision OT Bathroom Transfers Anticipated Outcome(s): Supervision OT Recommendation Recommendations for Other Services: Speech consult;Therapeutic Recreation consult Therapeutic Recreation Interventions: Pet therapy;Stress management Patient destination: Home Follow Up Recommendations: Home health OT Equipment Recommended: To be determined  Cognition/ BIMS Cognition Overall Cognitive Status: Impaired/Different from baseline Arousal/Alertness: Awake/alert Memory: Impaired Awareness: Appears intact Problem Solving: Impaired Safety/Judgment: Appears intact Brief Interview for Mental Status (BIMS) Repetition of Three Words (First Attempt): 3 Temporal Orientation: Year: Correct Temporal Orientation: Month: Accurate within 5 days Temporal Orientation: Day: Correct Recall: Sock: Yes, no cue required Recall: Blue: Yes, no cue required Recall: Bed: No, could not recall BIMS Summary Score: 13  Care Tool Care Tool Self Care Eating   Eating Assist Level: Independent with assistive device    Oral Care    Oral Care Assist Level: Independent with assistive device    Bathing   Body parts bathed by patient: Right arm;Left arm;Chest;Abdomen;Front perineal area;Buttocks;Right upper leg;Left upper leg;Face;Right lower leg   Body parts n/a: Left lower leg Assist Level: Independent with assistive device    Upper Body Dressing(including orthotics)   What is the patient wearing?: Pull over shirt   Assist Level: Independent with assistive device    Lower Body Dressing (excluding footwear)   What is the patient wearing?:  Underwear/pull up;Pants;Ace wrap/stump shrinker Assist for lower body dressing: Supervision/Verbal cueing   Putting on/Taking off footwear   What is the patient wearing?: Socks;Shoes Assist for footwear: Independent with assistive device Assistive Device Comment: increased time     Care Tool Toileting Toileting activity   Assist for toileting: Supervision/Verbal cueing     Care Tool Bed Mobility Roll left and right activity   Roll left and right assist level: Independent    Sit to lying activity   Sit to lying assist level: Independent    Lying to sitting on side of  bed activity   Lying to sitting on side of bed assist level: the ability to move from lying on the back to sitting on the side of the bed with no back support.: Independent     Care Tool Transfers Sit to stand transfer   Sit to stand assist level: Minimal Assistance - Patient > 75%    Chair/bed transfer   Chair/bed transfer assist level: Supervision/Verbal cueing     Toilet transfer   Assist Level: Supervision/Verbal cueing     Care Tool Cognition  Expression of Ideas and Wants Expression of Ideas and Wants: 4. Without difficulty (complex and basic) - expresses complex messages without difficulty and with speech that is clear and easy to understand  Understanding Verbal and Non-Verbal Content Understanding Verbal and Non-Verbal Content: 4. Understands (complex and basic) - clear comprehension without cues or repetitions   Memory/Recall Ability Memory/Recall Ability : Current season;That he or she is in a hospital/hospital unit;Staff names and faces   Skilled Intervention: Pt greeted sitting in WC, pain noted below. Pt voices already having completed UB care. Sink-side LB care completed with Min A pulling underwear/shorts over bottom while patient does a WC pushup, due to decreased activity tolerance with lateral leans this session. Encouragement provided throughout due to increased frustration with tasks.  Reviewed option to perform LB care at EOB for increased space to laterally lean and subsequent buttock clearance. Pt dependently transported from room<>ortho gym for energy conservation. In ortho gym, pt instructed in 6 x 2 min of Sci[employer] modality for BUE strengthening/endurance required for ADL participation/transfers. Pt then dependently transported out to atrium for change of scenery and uplift of mood. Discussed community re-integration, patient with no concerns in regards to car transfers. Pt remained sitting in WC with all immediate needs met, call bell within reach.   Pain: Pt reports a 8/10 pain in L-knee. Pre-mediated, rest provided as needed.    Refer to Care Plan for Long Term Goals  The above assessment, treatment plan, treatment alternatives and goals were discussed and mutually agreed upon: by patient  Nereida Habermann, OTR/L, MSOT  02/21/2024, 9:58 AM

## 2024-02-22 DIAGNOSIS — K5901 Slow transit constipation: Secondary | ICD-10-CM

## 2024-02-22 MED ORDER — CEFAZOLIN SODIUM-DEXTROSE 2-4 GM/100ML-% IV SOLN: 2.0000 g | INTRAVENOUS | Status: AC

## 2024-02-22 NOTE — Consult Note (Signed)
 Chief Complaint: Patient was seen in consultation today for malfunctioning fistula  Referring Physician(s): Maisie Ronnald Acosta, PA  Supervising Physician: Jenna Hacker  Patient Status: [facility] - In-pt  History of Present Illness: Phillip [name] is a 68 y.o. male with extensive medical history relevant for aortic stenosis, HFrEF, ESRD on HD via AVF, with recent admissions over the past 6 months for acute cholecystitis s/p IR perc cholecystostomy tube placement 11/2023, BKA 01/2024, delayed TAVR to due acute infections now on rehab for deconditioning. He has been to the IR department multiple times for drain exchanges due to frequent changes in output.  He is now nearing discharge, but has had intermittent issues with accessing his LUE AVF with +bruising.   IR consulted for tunneled HD catheter placement as patient is in need of reliable access for HD sessions.    Past Medical History:  Diagnosis Date   Abdominopelvic abscess (HCC) 09/29/2018   Abscess of appendix 09/22/2018   Acquired spondylolisthesis of lumbosacral region 03/20/2020   Acute cholecystitis 11/15/2023   Acute kidney injury (HCC) 10/07/2018   Acute kidney injury superimposed on chronic kidney disease (HCC) 11/06/2020   Acute kidney injury superimposed on CKD (HCC) 02/18/2020   Acute on chronic systolic (congestive) heart failure (HCC) 07/26/2015   Formatting of this note might be different from the original. Formatting of this note might be different from the original. Formatting of this note might be different from the original. revenkar EF 35% Formatting of this note might be different from the original. revenkar EF 35%     Acute on chronic systolic CHF (congestive heart failure) (HCC) 08/12/2023   Anasarca 07/01/2023   Anemia of chronic disease 08/18/2019   Anemia, chronic disease 08/18/2019   Aortic atherosclerosis (HCC)    Aortic stenosis    a.) TTE 09/15/2018: mild-mod (MPG 19); b.) TTE 03/21/2021:  mild-mod (MPG 14.3); c.) TTE 04/24/2022: mild- mod (MPG 15)   Aortic stenosis, moderate 10/15/2019   Appendicitis 09/08/2018   ARF (acute renal failure) (HCC) 02/19/2020   Asthma    Atherosclerosis of native arteries of the extremities with ulceration (HCC) 02/24/2018   Formatting of this note might be different from the original.  Last Assessment & Plan:   His ABIs today are 1.07 on the right and 0.99 on the left with biphasic waveforms.  Although these pressures may be somewhat elevated from medial calcification, his flow does appear to be reasonably good.  His left ABI was 0.58 prior to intervention.  At this point, we will stretch out his follow-up and see hi   Benign hypertension with CKD (chronic kidney disease) stage IV (HCC) 02/24/2018   Benign prostatic hyperplasia without lower urinary tract symptoms 01/14/2018   Bilateral lower extremity edema 11/04/2018   Bradycardia 03/25/2018   Bruit of right carotid artery 07/26/2015   CAD (coronary artery disease) 2003   a.) s/p 4v CABG 2003   Cardiac murmur 07/26/2015   Choledocholithiasis 11/12/2023   Chronic combined systolic and diastolic CHF (congestive heart failure) (HCC) 07/26/2015   a.) TTE 09/15/2018: EF 50-55%, mod LVH, RVE, BAE, mild-mod TR, AoV sclerosis, G1DD; b.) MPI 10/27/2019: EF <30%; c.) TTE 03/21/2021: EF 35-40%, post AK, inf HK, mod LVH, mod red RVSF, mod LAE, mod Aov sclerosis, G2DD; c.) TTE 04/24/2022: EF 35-40%, post AK, glok HK, mod LVE, mod red RVSF, mild-mod MR, Aov sclerosis, G3DD   Chronic idiopathic constipation 11/02/2018   Chronic low back pain without sciatica 12/02/2018   Chronic pain of  right hip 03/31/2020   Chronic systolic heart failure (HCC) 07/26/2015   Formatting of this note might be different from the original. revenkar EF 35%     CKD (chronic kidney disease) stage 4, GFR 15-29 ml/min (HCC) 10/07/2018   Congestive heart failure (HCC) 07/26/2015   Last Assessment & Plan:   Poor cardiac function could  certainly be contributing to poor blood supply to the feet and toes as well.     Formatting of this note might be different from the original.  Formatting of this note might be different from the original.  Last Assessment & Plan:   Poor cardiac function could certainly be contributing to poor blood supply to the feet and toes as well.     Last   Coronary artery disease    Diabetes mellitus without complication (HCC)    Dysphagia 07/26/2015   Encounter for long-term (current) use of insulin  (HCC) 09/27/2020   Erectile dysfunction 07/14/2017   ESRD on dialysis [facility]) 11/15/2023   Essential hypertension 02/24/2018   Fever 11/07/2020   Foot ulcer (HCC) 09/27/2020   left foot  Formatting of this note might be different from the original.  Formatting of this note might be different from the original.  left foot     Foot ulcer, left (HCC)    GERD (gastroesophageal reflux disease)    Heart murmur 07/26/2015   Hematuria 07/27/2015   High risk medication use 07/26/2015   History of marijuana use    Hyperkalemia 05/29/2020   Hyperlipidemia 07/26/2015   Hyperlipidemia associated with type 2 diabetes mellitus (HCC) 02/18/2020   Hypertension    Hypertension associated with diabetes (HCC)    Hypoglycemia 05/07/2019   Hypothyroidism 11/14/2015   Hypothyroidism (acquired) 11/14/2015   Insomnia 11/02/2018   Kidney insufficiency    Lumbar spondylosis 03/20/2020   Malaise and fatigue 03/25/2018   Malnutrition of moderate degree 08/20/2023   Mitral regurgitation 05/12/2019   Moderate aortic regurgitation 05/12/2019   Myocardial infarction due to demand ischemia (HCC) 09/26/2020   a.) Type II NSTEMI; b.) troponins were trended 0.54 --> 0.56 --> 0.52 ng/mL   Non-compliance 05/05/2019   NSTEMI (non-ST elevated myocardial infarction) (HCC) 09/27/2020   Osseous and subluxation stenosis of intervertebral foramina of lumbar region 03/20/2020   Osteoarthritis 10/06/2019   Pancytopenia (HCC) 08/12/2023    Peripheral vascular disease (HCC)    Personal history of tobacco use, presenting hazards to health 09/27/2020   Polyneuropathy in diabetes (HCC) 05/07/2019   Primary osteoarthritis involving multiple joints 10/06/2019   Primary osteoarthritis of right hip 02/21/2020   Pulmonary HTN (HCC)    a.) TTE 05/12/2019: PASP 71; b.) TTE 09/27/2020: PASP >70; c.) TTE 03/21/2021: RVSP 43; d.) TTE 04/24/2022: RVSP 80.6   Puncture wound of right hip 04/02/2018   PVD (peripheral vascular disease) with claudication (HCC)    a.) s/p PTA 03/02/2018 - balloon angioplasty LEFT below knee popliteal artery; b.) s/p PTA 09/30/2022: baloon angioplasty LEFT tibioperoneal trunck, most proximal peroneal artery, and LEFT popliteal artery.   Regional wall motion abnormality of heart 05/07/2019   Right groin pain 02/18/2020   S/P CABG x 4 2003   S/P TAVR (transcatheter aortic valve replacement) 01/20/2024   TAVR with a 29 mm Edwards Sapien 3 Ultra Resilia THV via the TF by Dr. Wendel and Dr. Shyrl   Screening for colon cancer 02/18/2018   Sepsis (HCC) 11/15/2023   Sleep apnea 07/26/2015   a.) does not require nocturnal PAP therapy   Subacute osteomyelitis  of left foot (HCC) 03/25/2018   Thrombocytopenia (HCC)    Type 2 diabetes mellitus with hyperlipidemia (HCC) 07/26/2015   Type 2 diabetes mellitus with stage 4 chronic kidney disease, with long-term current use of insulin  (HCC) 07/26/2015   Type II diabetes mellitus with neurological manifestations (HCC) 02/18/2020   Vitamin B12 deficiency 06/16/2019   Vitamin D  deficiency 05/07/2018    Past Surgical History:  Procedure Laterality Date   AMPUTATION Left 01/14/2024   Procedure: AMPUTATION, FOOT, PARTIAL;  Surgeon: Serene Gaile ORN, MD;  Location: [facility] OR;  Service: Vascular;  Laterality: Left;   AMPUTATION Left 01/30/2024   Procedure: AMPUTATION BELOW KNEE;  Surgeon: Serene Gaile ORN, MD;  Location: [facility] OR;  Service: Vascular;  Laterality: Left;   AMPUTATION  TOE Left 03/13/2018   Procedure: AMPUTATION TOE-MPJ;  Surgeon: Ashley Soulier, DPM;  Location: [facility] ORS;  Service: Podiatry;  Laterality: Left;   ANGIOPLASTY     AV FISTULA PLACEMENT Left 08/20/2023   Procedure: ARTERIOVENOUS (AV) FISTULA CREATION;  Surgeon: Gretta Lonni PARAS, MD;  Location: [facility] OR;  Service: Vascular;  Laterality: Left;   CARDIAC CATHETERIZATION     CORONARY ARTERY BYPASS GRAFT N/A 2003   INTRAOPERATIVE TRANSTHORACIC ECHOCARDIOGRAM N/A 01/20/2024   Procedure: ECHOCARDIOGRAM, TRANSTHORACIC;  Surgeon: Wendel Lurena POUR, MD;  Location: [facility] INVASIVE CV LAB;  Service: Cardiovascular;  Laterality: N/A;   IR CATHETER TUBE CHANGE  12/31/2023   IR DIALY SHUNT INTRO NEEDLE/INTRACATH INITIAL W/IMG LEFT Left 02/09/2024   IR EXCHANGE BILIARY DRAIN  02/03/2024   IR EXCHANGE BILIARY DRAIN  02/17/2024   IR FLUORO GUIDE CV LINE RIGHT  08/18/2023   IR PERC CHOLECYSTOSTOMY  11/12/2023   IR RADIOLOGIST EVAL & MGMT  12/31/2023   IR US  GUIDE VASC ACCESS RIGHT  08/18/2023   LAPAROSCOPIC APPENDECTOMY N/A 09/08/2018   Procedure: APPENDECTOMY LAPAROSCOPIC;  Surgeon: Jordis Laneta FALCON, MD;  Location: [facility] ORS;  Service: General;  Laterality: N/A;   LOWER EXTREMITY ANGIOGRAPHY Left 03/02/2018   Procedure: LOWER EXTREMITY ANGIOGRAPHY;  Surgeon: Marea Selinda RAMAN, MD;  Location: [facility] INVASIVE CV LAB;  Service: Cardiovascular;  Laterality: Left;   LOWER EXTREMITY ANGIOGRAPHY Left 09/30/2022   Procedure: Lower Extremity Angiography;  Surgeon: Marea Selinda RAMAN, MD;  Location: [facility] INVASIVE CV LAB;  Service: Cardiovascular;  Laterality: Left;   LOWER EXTREMITY ANGIOGRAPHY Left 12/26/2023   Procedure: Lower Extremity Angiography;  Surgeon: Marea Selinda RAMAN, MD;  Location: [facility] INVASIVE CV LAB;  Service: Cardiovascular;  Laterality: Left;   RIGHT/LEFT HEART CATH AND CORONARY/GRAFT ANGIOGRAPHY N/A 08/01/2023   Procedure: RIGHT/LEFT HEART CATH AND CORONARY/GRAFT ANGIOGRAPHY;  Surgeon: Wendel Lurena POUR, MD;  Location: [facility] INVASIVE CV  LAB;  Service: Cardiovascular;  Laterality: N/A;   ROTATOR CUFF REPAIR Left    TRANSMETATARSAL AMPUTATION Left 01/16/2024   Procedure: AMPUTATION, FOOT, TRANSMETATARSAL;  Surgeon: Serene Gaile ORN, MD;  Location: [facility] OR;  Service: Vascular;  Laterality: Left;    Allergies: Patient has no known allergies.  Medications: Prior to Admission medications   Medication Sig Start Date End Date Taking? Authorizing Provider  acetaminophen  (TYLENOL ) 325 MG tablet Take 2 tablets (650 mg total) by mouth every 6 (six) hours as needed for mild pain (pain score 1-3) or fever (or Fever >/= 101). 02/19/24   Angiulli, Toribio PARAS, PA-C  acetaminophen  (TYLENOL ) 500 MG tablet Take 1,000 mg by mouth every 6 (six) hours as needed for moderate pain or headache.     [provider]  amoxicillin -clavulanate (AUGMENTIN ) 500-125 MG tablet Take 1  tablet by mouth 2 (two) times daily. 02/19/24   Angiulli, Toribio PARAS, PA-C  aspirin  EC 81 MG tablet Take 1 tablet (81 mg total) by mouth daily. Swallow whole. 07/25/23   Thukkani, Arun K, MD  atorvastatin  (LIPITOR) 20 MG tablet Take 1 tablet (20 mg total) by mouth daily. 02/19/24   Angiulli, Toribio PARAS, PA-C  B Complex-C (B-COMPLEX WITH VITAMIN C) tablet Take 1 tablet by mouth daily. 02/19/24   Angiulli, Toribio PARAS, PA-C  bisacodyl  (DULCOLAX) 5 MG EC tablet Take 1 tablet (5 mg total) by mouth daily as needed for moderate constipation. 02/05/24   Sigdel, Santosh, MD  carvedilol  (COREG ) 3.125 MG tablet Take 1 tablet (3.125 mg total) by mouth 2 (two) times daily. 05/20/23 05/02/24  Carlin Delon BROCKS, NP  carvedilol  (COREG ) 3.125 MG tablet Take 1 tablet (3.125 mg total) by mouth at bedtime. 02/19/24   Angiulli, Toribio PARAS, PA-C  clopidogrel  (PLAVIX ) 75 MG tablet Take 1 tablet (75 mg total) by mouth daily. 12/26/23   Marea Selinda RAMAN, MD  clopidogrel  (PLAVIX ) 75 MG tablet Take 1 tablet (75 mg total) by mouth daily. 02/19/24   Angiulli, Toribio PARAS, PA-C  docusate sodium  (COLACE) 100 MG capsule Take 1  capsule (100 mg total) by mouth daily. 02/19/24   Angiulli, Toribio PARAS, PA-C  doxycycline  (VIBRA -TABS) 100 MG tablet Take 1 tablet (100 mg total) by mouth every 12 (twelve) hours. 02/19/24   Angiulli, Toribio PARAS, PA-C  gabapentin  (NEURONTIN ) 100 MG capsule Take 2 capsules (200 mg total) by mouth at bedtime. 02/19/24   Angiulli, Toribio PARAS, PA-C  gabapentin  (NEURONTIN ) 300 MG capsule Take 1 capsule (300 mg total) by mouth at bedtime. Patient taking differently: Take 600 mg by mouth 2 (two) times daily. 08/25/23   Arrien, Mauricio Daniel, MD  insulin  glargine (LANTUS ) 100 UNIT/ML injection Inject 0.07 mLs (7 Units total) into the skin at bedtime. Inject 7 units at bedtime as needed after checking glucose 02/05/24   Mcarthur Pick, MD  isosorbide  mononitrate (IMDUR ) 120 MG 24 hr tablet Take 1 tablet (120 mg total) by mouth daily. 08/27/23   Revankar, Jennifer SAUNDERS, MD  isosorbide  mononitrate (IMDUR ) 30 MG 24 hr tablet Take 1 tablet (30 mg total) by mouth daily. 02/19/24   Angiulli, Toribio PARAS, PA-C  latanoprost  (XALATAN ) 0.005 % ophthalmic solution Place 1 drop into both eyes at bedtime. 02/19/24   Angiulli, Toribio PARAS, PA-C  levothyroxine  (SYNTHROID ) 50 MCG tablet Take 1 tablet (50 mcg total) by mouth daily. 02/19/24   Angiulli, Toribio PARAS, PA-C  linezolid  (ZYVOX ) 600 MG tablet Take 1 tablet (600 mg total) by mouth every 12 (twelve) hours. 02/05/24   Sigdel, Santosh, MD  methocarbamol  (ROBAXIN ) 500 MG tablet Take 1 tablet (500 mg total) by mouth every 8 (eight) hours as needed for muscle spasms. 02/19/24   Angiulli, Toribio PARAS, PA-C  Methoxy PEG-Epoetin Beta (MIRCERA IJ) 30 mcg. 12/16/23 12/14/24  [provider]  nitroGLYCERIN  (NITROSTAT ) 0.4 MG SL tablet Place 1 tablet (0.4 mg total) under the tongue every 5 (five) minutes as needed for chest pain. 11/20/20   Revankar, Jennifer SAUNDERS, MD  nitroGLYCERIN  (NITROSTAT ) 0.4 MG SL tablet Place 1 tablet (0.4 mg total) under the tongue every 5 (five) minutes as needed for chest pain. 02/19/24    Angiulli, Toribio PARAS, PA-C  oxyCODONE  (OXY IR/ROXICODONE ) 5 MG immediate release tablet Take 1 tablet (5 mg total) by mouth every 12 (twelve) hours as needed for moderate pain (pain score 4-6). 02/19/24  Angiulli, Toribio PARAS, PA-C  sevelamer  carbonate (RENVELA ) 800 MG tablet Take 1 tablet (800 mg total) by mouth 3 (three) times daily with meals. 02/19/24 05/16/25  Lorilee Sven SQUIBB, MD  sodium chloride  flush (NS) 0.9 % SOLN Inject 5 mLs into the vein daily for 15 days. flush drain daily with 5 cc NS, record output daily, dressing changes every 2-3 days or earlier if soiled 11/14/23 11/29/23  Darci Pore, MD  sodium chloride  flush 0.9 % SOLN injection 10 mLs by Intracatheter route every 3 (three) days. 01/02/24 04/01/24  Covington, Jamie R, NP  traMADol  (ULTRAM ) 50 MG tablet Take 50 mg by mouth every 8 (eight) hours as needed. 09/15/23   [provider]  vitamin D3 (CHOLECALCIFEROL ) 25 MCG tablet Take 1 tablet (1,000 Units total) by mouth daily. 02/19/24   Angiulli, Toribio PARAS, PA-C     Family History  Problem Relation Age of Onset   Hyperlipidemia Mother    Hypertension Mother    Diabetes Mother    Heart attack Brother    Heart disease Brother    Lung disease Father     Social History   Socioeconomic History   Marital status: Married    Spouse name: Not on file   Number of children: Not on file   Years of education: Not on file   Highest education level: Not on file  Occupational History   Not on file  Tobacco Use   Smoking status: Former    Current packs/day: 0.00    Average packs/day: 0.5 packs/day for 25.0 years (12.5 ttl pk-yrs)    Types: Cigarettes    Start date: 70    Quit date: 2003    Years since quitting: 22.7   Smokeless tobacco: Never  Vaping Use   Vaping status: Never Used  Substance and Sexual Activity   Alcohol use: Not Currently    Comment: 2003   Drug use: Never   Sexual activity: Not on file  Other Topics Concern   Not on file  Social History  Narrative   Not on file   Social Drivers of Health   Financial Resource Strain: Low Risk  (09/26/2020)   Received from [facility]   Overall Financial Resource Strain (CARDIA)    Difficulty of Paying Living Expenses: Not hard at all  Food Insecurity: No Food Insecurity (02/12/2024)   Hunger Vital Sign    Worried About Running Out of Food in the Last Year: Never true    Ran Out of Food in the Last Year: Never true  Transportation Needs: No Transportation Needs (02/12/2024)   PRAPARE - Administrator, Civil Service (Medical): No    Lack of Transportation (Non-Medical): No  Physical Activity: Inactive (09/26/2020)   Received from [facility]   Exercise Vital Sign    On average, how many days per week do you engage in moderate to strenuous exercise (like a brisk walk)?: 0 days    On average, how many minutes do you engage in exercise at this level?: 0 min  Stress: No Stress Concern Present (09/26/2020)   Received from [facility] - Occupational Stress Questionnaire    Feeling of Stress : Not at all  Social Connections: Moderately Integrated (02/12/2024)   Social Connection and Isolation Panel    Frequency of Communication with Friends and Family: More than three times a week    Frequency of Social Gatherings with Friends and Family: Twice  a week    Attends Religious Services: More than 4 times per year    Active Member of Clubs or Organizations: No    Attends Banker Meetings: Never    Marital Status: Married     Review of Systems: A 12 point ROS discussed and pertinent positives are indicated in the HPI above.  All other systems are negative.  Review of Systems  Constitutional:  Negative for fatigue and fever.  Respiratory:  Negative for cough and shortness of breath.   Cardiovascular:  Negative for chest pain.  Gastrointestinal:  Negative for abdominal pain.  Musculoskeletal:  Negative for back pain.   Psychiatric/Behavioral:  Negative for behavioral problems and confusion.     Vital Signs: BP 118/79 (BP Location: Right Arm)   Pulse 75   Temp 98.5 F (36.9 C) (Oral)   Resp 16   Ht 6' (1.829 m)   Wt 160 lb 11.5 oz (72.9 kg)   SpO2 100%   BMI 21.80 kg/m   Physical Exam Vitals and nursing note reviewed.  Constitutional:      General: He is not in acute distress.    Appearance: Normal appearance. He is not ill-appearing.  Cardiovascular:     Rate and Rhythm: Normal rate and regular rhythm.  Pulmonary:     Effort: Pulmonary effort is normal. No respiratory distress.     Breath sounds: Normal breath sounds.  Abdominal:     General: Abdomen is flat.     Palpations: Abdomen is soft.     Comments: RUQ perc chole in place.  Small volume bilious output   Musculoskeletal:     Comments: L BKA  Skin:    General: Skin is warm and dry.  Neurological:     General: No focal deficit present.     Mental Status: He is alert and oriented to person, place, and time. Mental status is at baseline.  Psychiatric:        Mood and Affect: Mood normal.        Behavior: Behavior normal.        Thought Content: Thought content normal.        Judgment: Judgment normal.      MD Evaluation Airway: WNL Heart: WNL Abdomen: WNL Chest/ Lungs: WNL ASA  Classification: 3 Mallampati/Airway Score: Two   Imaging: ECHOCARDIOGRAM COMPLETE Result Date: 02/17/2024    ECHOCARDIOGRAM REPORT   Patient Name:   Phillip [name] Date of Exam: 02/17/2024 Medical Rec #:  [id number]        Height:       72.0 in Accession #:    [id number]       Weight:       162.5 lb Date of Birth:  [birth year]       BSA:          1.950 m Patient Age:    67 years         BP:           130/50 mmHg Patient Gender: M                HR:           66 bpm. Exam Location:  Inpatient Procedure: 2D Echo, Cardiac Doppler and Color Doppler (Both Spectral and Color            Flow Doppler were utilized during procedure). Indications:    Post  TAVR evaluation Z95.2  History:        Patient has  prior history of Echocardiogram examinations, most                 recent 01/21/2024. CAD, Prior CABG, Arrythmias:Bradycardia; Risk                 Factors:Diabetes and Hypertension. H/O Hyperlipidemia, Edema,                 Weakness, Palpitations, Aortic Stenosis S/P TAVR, Pulmonary                 hypertension, End stage renal disease.                 Aortic Valve: 29 mm Sapien prosthetic, stented (TAVR) valve is                 present in the aortic position.  Sonographer:    BERNARDA ROCKS Referring Phys: KATHRYN R THOMPSON IMPRESSIONS  1. Left ventricular ejection fraction, by estimation, is 25 to 30%. The left ventricle has severely decreased function. The left ventricle demonstrates global hypokinesis. The left ventricular internal cavity size was mildly dilated. Left ventricular diastolic parameters are indeterminate.  2. Right ventricular systolic function is mildly reduced. The right ventricular size is normal. There is mildly elevated pulmonary artery systolic pressure.  3. Left atrial size was mildly dilated.  4. The mitral valve is normal in structure. Mild to moderate mitral valve regurgitation.  5. The aortic valve has been repaired/replaced. Aortic valve regurgitation is not visualized. There is a 29 mm Sapien prosthetic (TAVR) valve present in the aortic position. Aortic valve area, by VTI measures 2.62 cm. Aortic valve mean gradient measures 8.0 mmHg. Aortic valve Vmax measures 2.00 m/s.  6. The inferior vena cava is normal in size with greater than 50% respiratory variability, suggesting right atrial pressure of 3 mmHg. FINDINGS  Left Ventricle: Left ventricular ejection fraction, by estimation, is 25 to 30%. The left ventricle has severely decreased function. The left ventricle demonstrates global hypokinesis. The left ventricular internal cavity size was mildly dilated. There is no left ventricular hypertrophy. Left ventricular diastolic  parameters are indeterminate. Right Ventricle: The right ventricular size is normal. No increase in right ventricular wall thickness. Right ventricular systolic function is mildly reduced. There is mildly elevated pulmonary artery systolic pressure. The tricuspid regurgitant velocity  is 2.95 m/s, and with an assumed right atrial pressure of 3 mmHg, the estimated right ventricular systolic pressure is 37.8 mmHg. Left Atrium: Left atrial size was mildly dilated. Right Atrium: Right atrial size was normal in size. Pericardium: There is no evidence of pericardial effusion. Mitral Valve: The mitral valve is normal in structure. Mild mitral annular calcification. Mild to moderate mitral valve regurgitation. MV peak gradient, 10.0 mmHg. The mean mitral valve gradient is 4.0 mmHg. Tricuspid Valve: The tricuspid valve is normal in structure. Tricuspid valve regurgitation is trivial. Aortic Valve: The aortic valve has been repaired/replaced. Aortic valve regurgitation is not visualized. Aortic valve mean gradient measures 8.0 mmHg. Aortic valve peak gradient measures 16.0 mmHg. Aortic valve area, by VTI measures 2.62 cm. There is a 29 mm Sapien prosthetic, stented (TAVR) valve present in the aortic position. Pulmonic Valve: The pulmonic valve was normal in structure. Pulmonic valve regurgitation is not visualized. Aorta: The aortic root is normal in size and structure. Venous: The inferior vena cava is normal in size with greater than 50% respiratory variability, suggesting right atrial pressure of 3 mmHg. IAS/Shunts: The interatrial septum was not well visualized.  LEFT VENTRICLE PLAX  2D LVIDd:         6.20 cm      Diastology LVIDs:         5.80 cm      LV e' medial:    14.90 cm/s LV PW:         0.80 cm      LV E/e' medial:  9.6 LV IVS:        1.00 cm      LV e' lateral:   10.70 cm/s LVOT diam:     2.10 cm      LV E/e' lateral: 13.4 LV SV:         96 LV SV Index:   49 LVOT Area:     3.46 cm  LV Volumes (MOD) LV vol d, MOD  A2C: 402.0 ml LV vol d, MOD A4C: 302.0 ml LV vol s, MOD A2C: 222.0 ml LV vol s, MOD A4C: 184.0 ml LV SV MOD A2C:     180.0 ml LV SV MOD A4C:     302.0 ml LV SV MOD BP:      154.8 ml RIGHT VENTRICLE            IVC RV Basal diam:  4.70 cm    IVC diam: 2.00 cm RV S prime:     7.72 cm/s RVOT diam:      3.40 cm TAPSE (M-mode): 1.7 cm RVSP:           37.8 mmHg LEFT ATRIUM             Index        RIGHT ATRIUM           Index LA diam:        4.00 cm 2.05 cm/m   RA Pressure: 3.00 mmHg LA Vol (A2C):   63.6 ml 32.61 ml/m  RA Area:     16.20 cm LA Vol (A4C):   54.8 ml 28.10 ml/m  RA Volume:   37.20 ml  19.07 ml/m LA Biplane Vol: 59.1 ml 30.30 ml/m  AORTIC VALVE                     PULMONIC VALVE AV Area (Vmax):    2.55 cm      PV Vmax:       1.36 m/s AV Area (Vmean):   2.19 cm      PV Peak grad:  7.5 mmHg AV Area (VTI):     2.62 cm AV Vmax:           200.00 cm/s AV Vmean:          132.000 cm/s AV VTI:            0.368 m AV Peak Grad:      16.0 mmHg AV Mean Grad:      8.0 mmHg LVOT Vmax:         147.00 cm/s LVOT Vmean:        83.500 cm/s LVOT VTI:          0.278 m LVOT/AV VTI ratio: 0.76  AORTA Ao Root diam: 3.20 cm Ao Asc diam:  3.80 cm MITRAL VALVE                TRICUSPID VALVE MV Area (PHT): 3.93 cm     TR Peak grad:   34.8 mmHg MV Area VTI:   2.29 cm     TR Vmax:        295.00 cm/s  MV Peak grad:  10.0 mmHg    Estimated RAP:  3.00 mmHg MV Mean grad:  4.0 mmHg     RVSP:           37.8 mmHg MV Vmax:       1.58 m/s MV Vmean:      87.3 cm/s    SHUNTS MV Decel Time: 193 msec     Systemic VTI:  0.28 m MR Peak grad: 83.9 mmHg     Systemic Diam: 2.10 cm MR Mean grad: 53.0 mmHg     Pulmonic Diam: 3.40 cm MR Vmax:      458.00 cm/s MR Vmean:     343.0 cm/s MV E velocity: 143.00 cm/s MV A velocity: 112.00 cm/s MV E/A ratio:  1.28 Aditya Sabharwal Electronically signed by Ria Commander Signature Date/Time: 02/17/2024/5:20:06 PM    Final    IR EXCHANGE BILIARY DRAIN Result Date: 02/17/2024 INDICATION: Occluded  cholecystostomy EXAM: FLUOROSCOPIC EXCHANGE AND UPSIZE OF THE EXISTING CHOLECYSTOSTOMY MEDICATIONS: 1% LIDOCAINE  LOCAL ANESTHESIA/SEDATION: Moderate (conscious) sedation was employed during this procedure. A total of Versed  1.0 mg and Fentanyl  25 mcg was administered intravenously by the radiology nurse. Total intra-service moderate Sedation Time: 10 minutes. The patient's level of consciousness and vital signs were monitored continuously by radiology nursing throughout the procedure under my direct supervision. FLUOROSCOPY: Radiation Exposure Index (as provided by the fluoroscopic device): 7.0 mGy Kerma COMPLICATIONS: None immediate. PROCEDURE: Informed written consent was obtained from the patient after a thorough discussion of the procedural risks, benefits and alternatives. All questions were addressed. Maximal Sterile Barrier Technique was utilized including caps, mask, sterile gowns, sterile gloves, sterile drape, hand hygiene and skin antiseptic. A timeout was performed prior to the initiation of the procedure. Under sterile conditions and local anesthesia attempts were made to remove the occluded catheter. This was unsuccessful. Catheter was cut and removed. Percutaneous tract was recannulated with a Kumpe catheter. Catheter advanced into the gallbladder. Contrast injection confirms gallbladder packed with stones. Cystic duct occluded. Amplatz guidewire inserted. Tract dilatation performed to insert a 12 French cholecystostomy. Retention loop formed in the gallbladder. Contrast injection confirms position. Images obtained for documentation. Catheter secured with a silk suture and a sterile dressing. Gravity drainage bag connected. IMPRESSION: Successful fluoroscopic exchange and upsize of the percutaneous cholecystostomy Electronically Signed   By: CHRISTELLA.  Shick M.D.   On: 02/17/2024 12:10   CT ABDOMEN PELVIS WO CONTRAST Result Date: 02/15/2024 CLINICAL DATA:  Cholecystostomy tube not flushing. EXAM: CT  ABDOMEN AND PELVIS WITHOUT CONTRAST TECHNIQUE: Multidetector CT imaging of the abdomen and pelvis was performed following the standard protocol without IV contrast. RADIATION DOSE REDUCTION: This exam was performed according to the departmental dose-optimization program which includes automated exposure control, adjustment of the mA and/or kV according to patient size and/or use of iterative reconstruction technique. COMPARISON:  CT abdomen pelvis dated 02/05/2024. FINDINGS: Evaluation of this exam is limited in the absence of intravenous contrast. Lower chest: The visualized lung bases are clear. Aortic valve repair. Coronary vascular calcification. No intra-abdominal free air or free fluid. Hepatobiliary: The liver is unremarkable. No BD dilatation. High attenuating content within the gallbladder likely combination of sludge, stones, and injected contrast. There is diffuse thickened and hazy appearance of the gallbladder wall likely related to chronic inflammation. Percutaneous cholecystostomy with pigtail tip in the gallbladder fundus. No extraluminal fluid collection noted. Pancreas: Unremarkable. No pancreatic ductal dilatation or surrounding inflammatory changes. Spleen: Normal in size without focal abnormality. Adrenals/Urinary Tract: The adrenal glands  unremarkable. There is no hydronephrosis or nephrolithiasis on either side. The visualized ureters and urinary bladder appear unremarkable. Stomach/Bowel: There is moderate stool throughout the colon. There is no bowel obstruction or active inflammation. Appendectomy. Vascular/Lymphatic: Moderate aortoiliac atherosclerotic disease. The IVC is unremarkable. No portal venous gas. There is no adenopathy. Reproductive: The prostate and seminal vesicles are grossly remarkable. Other: There is skin thickening and subcutaneous stranding of the left anterior abdominal wall may be related to contusion. No fluid collection or hematoma. Musculoskeletal: Degenerative  changes of the spine. No acute osseous pathology. Bilateral L5 pars defects with grade 1 anterolisthesis. IMPRESSION: 1. Percutaneous cholecystostomy with pigtail tip in the gallbladder fundus. 2. No hydronephrosis or nephrolithiasis. 3. No bowel obstruction. 4.  Aortic Atherosclerosis (ICD10-I70.0). Electronically Signed   By: Vanetta Chou M.D.   On: 02/15/2024 19:17   IR DIALY SHUNT INTRO NEEDLE/INTRACATH INITIAL W/IMG LEFT Result Date: 02/09/2024 INDICATION: Difficult cannulation, infiltration EXAM: Left upper arm AV fistulagram MEDICATIONS: None. ANESTHESIA/SEDATION: Total intra-service moderate Sedation Time: None. The patient's level of consciousness and vital signs were monitored continuously by radiology nursing throughout the procedure under my direct supervision. FLUOROSCOPY: Radiation Exposure Index (as provided by the fluoroscopic device): 13 mGy Kerma COMPLICATIONS: None immediate. PROCEDURE: Informed written consent was obtained from the patient after a thorough discussion of the procedural risks, benefits and alternatives. All questions were addressed. Maximal Sterile Barrier Technique was utilized including caps, mask, sterile gowns, sterile gloves, sterile drape, hand hygiene and skin antiseptic. A timeout was performed prior to the initiation of the procedure. Under sterile conditions, percutaneous Angiocath access performed of the left upper arm patent brachiocephalic fistula. Contrast injection performed for a complete fistulagram. Arterial anastomosis to the brachial artery at the elbow is widely patent. Outflow cephalic vein is patent in the upper arm and across the shoulder. Patent cephalic arch. Centrally, the subclavian, innominate vein, and SVC are all patent. No significant flow limiting stenosis, occlusion or thrombus. Access removed. Hemostasis obtained with manual compression. No immediate complication. Patient tolerated the procedure well. IMPRESSION: Patent left upper arm  brachiocephalic AV fistula circuit. ACCESS: This access remains amenable to future percutaneous interventions as clinically indicated. Electronically Signed   By: CHRISTELLA.  Shick M.D.   On: 02/09/2024 09:19   CT ABDOMEN PELVIS WO CONTRAST Result Date: 02/05/2024 CLINICAL DATA:  Cholecystostomy tube not flushing. Evaluate for placement location. EXAM: CT ABDOMEN AND PELVIS WITHOUT CONTRAST TECHNIQUE: Multidetector CT imaging of the abdomen and pelvis was performed following the standard protocol without IV contrast. RADIATION DOSE REDUCTION: This exam was performed according to the departmental dose-optimization program which includes automated exposure control, adjustment of the mA and/or kV according to patient size and/or use of iterative reconstruction technique. COMPARISON:  CT abdomen pelvis dated 02/02/2024. FINDINGS: Evaluation of this exam is limited in the absence of intravenous contrast. Lower chest: No acute abnormality. No intra-abdominal free air or free fluid. Hepatobiliary: The liver is unremarkable. Percutaneous cholecystostomy with pigtail tip in the gallbladder lumen in similar position as the prior CT. The gallbladder is filled with stones. There is thickened appearance of the gallbladder wall. Pancreas: Unremarkable. No pancreatic ductal dilatation or surrounding inflammatory changes. Spleen: Normal in size without focal abnormality. Adrenals/Urinary Tract: The adrenal glands are unremarkable. Mild right pelvicaliectasis. Excreted contrast noted in the renal collecting systems and urinary bladder. Stomach/Bowel: Dense oral contrast noted throughout the colon. There is no bowel obstruction or active inflammation. Appendectomy. Vascular/Lymphatic: Mild aortoiliac atherosclerotic disease. The IVC is unremarkable. No portal gas.  There is no adenopathy. Reproductive: The prostate and seminal vesicles are grossly unremarkable Other: None Musculoskeletal: Degenerative changes of the spine. Bilateral L5 pars  defects with grade 1 anterolisthesis. No acute osseous pathology. IMPRESSION: 1. Percutaneous cholecystostomy with pigtail tip in the gallbladder lumen in similar position as the prior CT. 2. Cholelithiasis. 3. No bowel obstruction. 4.  Aortic Atherosclerosis (ICD10-I70.0). Electronically Signed   By: Vanetta Chou M.D.   On: 02/05/2024 20:04   IR EXCHANGE BILIARY DRAIN Result Date: 02/03/2024 INDICATION: History of acute cholecystitis, post ultrasound fluoroscopic guided cholecystostomy tube placement on 12/31/2023. EXAM: FLUOROSCOPIC GUIDED CHOLECYSTOSTOMY TUBE EXCHANGE COMPARISON:  12/31/2023, 02/02/2024 MEDICATIONS: None ANESTHESIA/SEDATION: None CONTRAST:  20mL OMNIPAQUE  IOHEXOL  300 MG/ML SOLN - administered into the gallbladder lumen. FLUOROSCOPY TIME:  Three mGy reference air kerma COMPLICATIONS: None immediate. PROCEDURE: The patient was positioned supine on the fluoroscopy table. The external portion of the existing cholecystostomy tube as well as the surrounding skin was prepped and draped in usual sterile fashion. A time-out was performed prior to the initiation of the procedure. A preprocedural spot fluoroscopic image was obtained of the right upper abdominal quadrant existing cholecystostomy tube. The skin surrounding the cholecystostomy tube was anesthetized with 1% lidocaine  with epinephrine . The external portion of the cholecystostomy tube was cut and cannulated with a short Amplatz wire which was advanced through the tube and coiled within the gallbladder lumen Next, under intermittent fluoroscopic guidance, the existing 10 French cholecystostomy tube was exchanged for a new 10 [place] cholecystostomy tube which was repositioned into the more central aspect of the gallbladder lumen. Contrast injection confirms appropriate positioning and functionality of the cholecystomy tube. The cholecystostomy tube was flushed with a small amount of saline and reconnected to a suction bulb. The  cholecystostomy tube was secured with an interrupted suture and a Stat Lock device. A dressing was applied. The patient tolerated the procedure well without immediate postprocedural complication. FINDINGS: Preprocedural spot fluoroscopic image demonstrates unchanged positioning of cholecystostomy tube with end coiled and locked over the expected location of the fundus of the gallbladder There was purulent aspirate from the drain. After fluoroscopic guided exchange, the new 10 [place] cholecystostomy tube is more ideally positioned with end coiled and locked within the central aspect of the gallbladder lumen. Post exchange cholangiogram demonstrates appropriate positioning and functionality of the new cholecystostomy tube. IMPRESSION: Successful fluoroscopic guided exchange of 10 Fr percutaneous cholecystostomy tube. PLAN: Keep to bulb suction until purulent output clears, then switch to bag drainage. Ester Sides, MD [facility] Electronically Signed   By: Ester Sides M.D.   On: 02/03/2024 22:20   CT ABDOMEN PELVIS W CONTRAST Result Date: 02/02/2024 CLINICAL DATA:  Provided history: purulence from cholecystostomy tube, rule out abscess EXAM: CT ABDOMEN AND PELVIS WITH CONTRAST TECHNIQUE: Multidetector CT imaging of the abdomen and pelvis was performed using the standard protocol following bolus administration of intravenous contrast. RADIATION DOSE REDUCTION: This exam was performed according to the departmental dose-optimization program which includes automated exposure control, adjustment of the mA and/or kV according to patient size and/or use of iterative reconstruction technique. CONTRAST:  75mL OMNIPAQUE  IOHEXOL  350 MG/ML SOLN COMPARISON:  Most recent abdominal imaging MRI 11/11/2023, abdominopelvic CT 11/06/2020 FINDINGS: Lower chest: 4 mm subpleural left lower lobe nodule, series 4, image 11, unchanged from 2022 and considered definitively benign.  No pleural fluid or basilar opacity. Hepatobiliary: Cholecystostomy tube is coiled in the gallbladder. The gallbladder is filled with innumerable gallstones. Soft tissue thickening, stranding,  and minimal fluid is seen adjacent to the portion of the tubing outside the gallbladder extending to the subcutaneous tissues. No definite pericholecystic collection. Suggestion of mild pericholecystic fat stranding. Gallbladder wall is not well assessed on the current exam. No common bile duct dilatation. No intrahepatic biliary ductal dilatation. Diffuse hepatic steatosis. No evidence of intrahepatic collection. Pancreas: No ductal dilatation or inflammation. Spleen: Mildly enlarged, 13.5 cm AP. Tiny hypodensity in the inferior spleen is too small to characterize but typically of no clinical significance. Adrenals/Urinary Tract: No adrenal nodule. No hydronephrosis or evidence of renal inflammation. Absent renal excretion on delayed phase imaging. No renal calculi. Partially distended urinary bladder. No bladder wall thickening. Stomach/Bowel: Physiologic appearance of the stomach. There is no small bowel obstruction or inflammation. Moderate formed stool in the colon. Scattered colonic diverticula. No diverticulitis. Appendectomy. Vascular/Lymphatic: Advanced aortic atherosclerosis. No aortic aneurysm. Patent portal and splenic veins. No suspicious lymphadenopathy. Reproductive: Prostate is unremarkable. Other: No free air. No significant ascites. Soft tissue density in gas within the anterior abdominal wall likely related to medication injections. Musculoskeletal: No acute osseous findings. Right hip osteoarthritis. Lumbar degenerative change. Chronic bilateral L5 pars defects. IMPRESSION: 1. Cholecystostomy tube is coiled in the gallbladder. The gallbladder is filled with innumerable gallstones. Soft tissue thickening, stranding, and minimal fluid is seen adjacent to the portion of the tubing outside the gallbladder  extending to the subcutaneous tissues. No definite pericholecystic collection. Suggestion of mild pericholecystic fat stranding. 2. Hepatic steatosis. 3. Mild splenomegaly. 4. Colonic diverticulosis without diverticulitis. 5. Absent renal excretion on delayed phase imaging, consistent with end-stage renal disease. Aortic Atherosclerosis (ICD10-I70.0). Electronically Signed   By: Andrea Gasman M.D.   On: 02/02/2024 19:51    Labs:  CBC: Recent Labs    02/14/24 1438 02/15/24 0542 02/16/24 0603 02/17/24 1259 02/18/24 1029 02/20/24 0730  WBC 4.7  --  4.4 4.9  --  4.9  HGB 6.8*   < > 8.3* 7.6* 8.8* 7.8*  HCT 22.1*   < > 26.1* 23.8* 28.1* 24.8*  PLT 38*  --  34* 47*  --  104*   < > = values in this interval not displayed.    COAGS: Recent Labs    11/11/23 1930 11/12/23 1011 01/19/24 1844 02/09/24 0515  INR 1.4* 1.3* 1.1 1.1  APTT  --   --  39*  --     BMP: Recent Labs    02/12/24 1217 02/14/24 1430 02/17/24 1259 02/20/24 0730  NA 131* 129* 132* 137  K 4.6 4.4 4.6 4.4  CL 93* 92* 98 105  CO2 26 27 26 24   GLUCOSE 131* 113* 163* 95  BUN 66* 57* 57* 56*  CALCIUM  8.5* 8.3* 8.7* 7.8*  CREATININE 7.40* 5.56* 6.57* 6.24*  GFRNONAA 7* 10* 9* 9*    LIVER FUNCTION TESTS: Recent Labs    11/13/23 0821 01/15/24 0338 01/19/24 1844 01/26/24 0328 02/03/24 1630 02/12/24 1217 02/14/24 1430 02/17/24 1259 02/20/24 0730  BILITOT 2.4* 0.8 0.8 0.8  --   --   --   --   --   AST 187* 19 22 17   --   --   --   --   --   ALT 97* 11 <5 <5  --   --   --   --   --   ALKPHOS 123 142* 161* 96  --   --   --   --   --   PROT 8.1 7.6 9.4* 8.4*  --   --   --   --   --  ALBUMIN  2.7* 2.1* 2.5* 2.4*   < > 2.3* 2.1* 2.2* 2.1*   < > = values in this interval not displayed.    TUMOR MARKERS: No results for input(s): AFPTM, CEA, CA199, CHROMGRNA in the last 8760 hours.  Assessment and Plan: Mulfunctioning LUE AVF IR consulted for tunneled HD catheter placement in the setting of  intermittent issues accessing his LUE AVF. Now with bruising. Patient to be NPO p MN 9/22 Ancef  2g ordered.  Risks and benefits discussed with the patient including, but not limited to bleeding, infection, vascular injury, pneumothorax which may require chest tube placement, air embolism or even death  All of the patient's questions were answered, patient is agreeable to proceed. Consent signed and in chart.  Thank you for this interesting consult.  I greatly enjoyed meeting SCOT SHIRAISHI and look forward to participating in their care.  A copy of this report was sent to the requesting provider on this date.  Electronically Signed: Isis Costanza Sue-Ellen Vicky Schleich, PA 02/22/2024, 8:45 AM   I spent a total of 20 Minutes    in face to face in clinical consultation, greater than 50% of which was counseling/coordinating care for malfunctioning AVF.

## 2024-02-22 NOTE — Plan of Care (Signed)
  Problem: RH BOWEL ELIMINATION Goal: RH STG MANAGE BOWEL WITH ASSISTANCE Description: STG Manage Bowel with Assistance. Outcome: Progressing Goal: RH STG MANAGE BOWEL W/MEDICATION W/ASSISTANCE Description: STG Manage Bowel with Medication with Assistance. Outcome: Progressing   Problem: RH BLADDER ELIMINATION Goal: RH STG MANAGE BLADDER WITH ASSISTANCE Description: STG Manage Bladder With Assistance Outcome: Progressing Goal: RH STG MANAGE BLADDER WITH MEDICATION WITH ASSISTANCE Description: STG Manage Bladder With Medication With Assistance. Outcome: Progressing   Problem: RH SAFETY Goal: RH STG ADHERE TO SAFETY PRECAUTIONS W/ASSISTANCE/DEVICE Description: STG Adhere to Safety Precautions With Assistance/Device. Outcome: Progressing Goal: RH STG DECREASED RISK OF FALL WITH ASSISTANCE Description: STG Decreased Risk of Fall With Assistance. Outcome: Progressing   Problem: RH PAIN MANAGEMENT Goal: RH STG PAIN MANAGED AT OR BELOW PT'S PAIN GOAL Outcome: Progressing   Problem: RH KNOWLEDGE DEFICIT LIMB LOSS Goal: RH STG INCREASE KNOWLEDGE OF SELF CARE AFTER LIMB LOSS Outcome: Progressing

## 2024-02-22 NOTE — Plan of Care (Signed)
  Problem: Consults Goal: RH LIMB LOSS PATIENT EDUCATION Description: Description: See Patient Education module for eduction specifics. Outcome: Progressing Goal: Skin Care Protocol Initiated - if Braden Score 18 or less Description: If consults are not indicated, leave blank or document N/A Outcome: Progressing Goal: Nutrition Consult-if indicated Outcome: Progressing Goal: Diabetes Guidelines if Diabetic/Glucose > 140 Description: If diabetic or lab glucose is > 140 mg/dl - Initiate Diabetes/Hyperglycemia Guidelines & Document Interventions  Outcome: Progressing   Problem: RH BOWEL ELIMINATION Goal: RH STG MANAGE BOWEL WITH ASSISTANCE Description: STG Manage Bowel with Assistance. Outcome: Progressing Goal: RH STG MANAGE BOWEL W/MEDICATION W/ASSISTANCE Description: STG Manage Bowel with Medication with Assistance. Outcome: Progressing   Problem: RH BLADDER ELIMINATION Goal: RH STG MANAGE BLADDER WITH ASSISTANCE Description: STG Manage Bladder With Assistance Outcome: Progressing Goal: RH STG MANAGE BLADDER WITH MEDICATION WITH ASSISTANCE Description: STG Manage Bladder With Medication With Assistance. Outcome: Progressing Goal: RH STG MANAGE BLADDER WITH EQUIPMENT WITH ASSISTANCE Description: STG Manage Bladder With Equipment With Assistance Outcome: Progressing   Problem: RH SKIN INTEGRITY Goal: RH STG SKIN FREE OF INFECTION/BREAKDOWN Outcome: Progressing Goal: RH STG MAINTAIN SKIN INTEGRITY WITH ASSISTANCE Description: STG Maintain Skin Integrity With Assistance. Outcome: Progressing Goal: RH STG ABLE TO PERFORM INCISION/WOUND CARE W/ASSISTANCE Description: STG Able To Perform Incision/Wound Care With Assistance. Outcome: Progressing   Problem: RH SAFETY Goal: RH STG ADHERE TO SAFETY PRECAUTIONS W/ASSISTANCE/DEVICE Description: STG Adhere to Safety Precautions With Assistance/Device. Outcome: Progressing Goal: RH STG DECREASED RISK OF FALL WITH  ASSISTANCE Description: STG Decreased Risk of Fall With Assistance. Outcome: Progressing   Problem: RH PAIN MANAGEMENT Goal: RH STG PAIN MANAGED AT OR BELOW PT'S PAIN GOAL Outcome: Progressing   Problem: RH KNOWLEDGE DEFICIT LIMB LOSS Goal: RH STG INCREASE KNOWLEDGE OF SELF CARE AFTER LIMB LOSS Outcome: Progressing   Problem: RH Pre-functional/Other (Specify) Goal: RH LTG Pre-functional (Specify) Outcome: Progressing Goal: RH LTG Interdisciplinary (Specify) 1 Description: RH LTG Interdisciplinary (Specify)1 Outcome: Progressing Goal: RH LTG Interdisciplinary (Specify) 2 Description: RH LTG Interdisciplinary (Specify) 2 Outcome: Progressing

## 2024-02-22 NOTE — Progress Notes (Signed)
 Phillip [name] Phillip [name] Progress Note   Subjective:  Seen in room. No complaints this am. Unable to cannulate fistula yesterday and now planning for [facility] placement Monday.   Objective Vitals:   02/21/24 1932 02/21/24 2202 02/22/24 0445 02/22/24 0500  BP: (!) 94/59 121/60 118/79   Pulse: 80 79 75   Resp: 16  16   Temp: 98 F (36.7 C)  98.5 F (36.9 C)   TempSrc:   Oral   SpO2: 100%  100%   Weight:    72.9 kg  Height:       Physical Exam General: Well appearing man, NAD. Room air Heart: RRR Lungs: CTAB Abdomen: soft, chole tube in place Extremities: Trace LE edema Dialysis Access:  LUE AVF +t/b, + bruising  Additional Objective Labs: Basic Metabolic Panel: Recent Labs  Lab 02/17/24 1259 02/20/24 0730  NA 132* 137  K 4.6 4.4  CL 98 105  CO2 26 24  GLUCOSE 163* 95  BUN 57* 56*  CREATININE 6.57* 6.24*  CALCIUM  8.7* 7.8*  PHOS 2.7 4.7*   Liver Function Tests: Recent Labs  Lab 02/17/24 1259 02/20/24 0730  ALBUMIN  2.2* 2.1*   CBC: Recent Labs  Lab 02/16/24 0603 02/17/24 1259 02/18/24 1029 02/20/24 0730  WBC 4.4 4.9  --  4.9  HGB 8.3* 7.6* 8.8* 7.8*  HCT 26.1* 23.8* 28.1* 24.8*  MCV 94.9 94.8  --  97.3  PLT 34* 47*  --  104*   Studies/Results: No results found.  Medications:   ceFAZolin  (ANCEF ) IV      (feeding supplement) PROSource Plus  30 mL Oral BID BM   sodium chloride    Intravenous Once   sodium chloride    Intravenous Once   amoxicillin -clavulanate  1 tablet Oral QHS   aspirin  EC  81 mg Oral Daily   atorvastatin   20 mg Oral Daily   B-complex with vitamin C  1 tablet Oral Daily   carvedilol   3.125 mg Oral QHS   Chlorhexidine  Gluconate Cloth  6 each Topical Q0600   cholecalciferol   1,000 Units Oral Daily   clopidogrel   75 mg Oral Daily   darbepoetin (ARANESP ) injection - DIALYSIS  100 mcg Subcutaneous Weekly   docusate sodium   100 mg Oral Daily   doxycycline   100 mg Oral Q12H   gabapentin   200 mg Oral QHS   isosorbide  mononitrate  30  mg Oral Daily   l-methylfolate-B6-B12  1 tablet Oral Daily   latanoprost   1 drop Both Eyes QHS   levothyroxine   50 mcg Oral Daily   nutrition supplement (JUVEN)  1 packet Oral BID BM   sevelamer  carbonate  800 mg Oral TID WC   sodium chloride  flush  5 mL Intracatheter Q8H    Dialysis Orders TTS [facility] 4hr, 350/800, EDW 81kg, 2K/2.5Ca, L AVF, no heparin  - no VDRA, ESA ordered but not yet given   Assessment/Plan: 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 Per [facility].  In CIR. Severe aortic stenosis s/p TAVR 01/20/24: Repeat echo 9/16 with persistent/worsened HF (EF 20-25%).  ESRD: HD TTS - Has been off schedule d/t access issues.  Vascular Access: S/p AVF infiltration and difficulty cannulating at his outpatient HD center. F'gram completed 9/8 - normal/patent AVF.  Needs expert cannulator with every treatment. Unable to cannulate AVF again on 9/20. IR consulted for [facility] placement Monday - appreciate their assistance  HTN/volume: BP in goal. Continue carvedilol  3.125mg  BID and Imdur  30mg   every day. S/p amputation, will need lower edw on discharge.  Anemia of ESRD: Hgb 7-8. S/p 1U PRBCs on 9/13. Continue Aranesp  100mcg q Friday. Secondary HPTH: CorrCa ok and phos ok- continue sevelamer  as binder. Nutrition: Alb low, continue supps. Hx cholecystitis with perc chole drain still in place: Erythema/purulence around drain. S/p tube exchange 9/2. Remains on Augmentin . Repeat CT 9/14 without abscess or other changes to area, s/p exchange to larger tube 9/16. T2DM Dispo: In CIR    Phillip Liddy Ronnald Acosta PA-C [place] Phillip [name] 02/22/2024,12:20 PM

## 2024-02-22 NOTE — Progress Notes (Signed)
 PROGRESS NOTE   Subjective/Complaints:  Pt doing ok but bummed that he has to stay another day d/t issues with dialysis yesterday-- couldn't access fistula, IR consulted and will assist with [facility] insertion tomorrow. Pt slept poorly but that's because of the bed, pain well managed, LBM yesterday and again after eval, urinating her his usual. No other complaints or concerns.    ROS: as per HPI. Denies CP, SOB, abd pain, N/V/D/C, or any other complaints at this time.   +fatigue, +bright red blood in ostomy  Objective:   No results found.    Recent Labs    02/20/24 0730  WBC 4.9  HGB 7.8*  HCT 24.8*  PLT 104*   Recent Labs    02/20/24 0730  NA 137  K 4.4  CL 105  CO2 24  GLUCOSE 95  BUN 56*  CREATININE 6.24*  CALCIUM  7.8*    Intake/Output Summary (Last 24 hours) at 02/22/2024 1236 Last data filed at 02/22/2024 0803 Gross per 24 hour  Intake 490 ml  Output 25 ml  Net 465 ml        Physical Exam: Vital Signs Blood pressure 118/79, pulse 75, temperature 98.5 F (36.9 C), temperature source Oral, resp. rate 16, height 6' (1.829 m), weight 72.9 kg, SpO2 100%.  Constitutional: No distress . Vital signs reviewed.  Sitting upright in w/c  HEENT: NCAT, EOMI, oral membranes moist Neck: supple Cardiovascular: RRR with ?murmur. No JVD    Respiratory/Chest: CTA Bilaterally without wheezes or rales. Normal effort    GI/Abdomen: BS +, non-tender, non-distended, soft, perc chole tube with greenish brown liquid inside bag Ext: no clubbing, cyanosis, or edema Psych: pleasant and cooperative but a little bummed he's not going home.   PRIOR EXAMS: Skin: Left aVF, left BK with s/s drainage, gauze, ACE wrap in place + Left AV fistula, with palpable thrill + Left BKA site, with with clean overlying dressing, shrinker on + Right flank biliary tube with thin, milky drainage around wound, no drainage in bulb.  No expressible  pus.  Mild surrounding erythema, no warmth or tenderness.  Neuro: Alert and oriented to person, place, reasons, month/year.  Speech clearer. Cranial nerves II through XII grossly intact. Bilateral upper extremity strength 5 out of 5. Right lower extremity strength 4 out of 5 proximal, 5 out of 5 distal. Left lower extremity at least 4-5, stable 9/18 Sensation intact light touch in all extremities    Assessment/Plan: 1. Functional deficits which require 3+ hours per day of interdisciplinary therapy in a comprehensive inpatient rehab setting. Physiatrist is providing close team supervision and 24 hour management of active medical problems listed below. Physiatrist and rehab team continue to assess barriers to discharge/monitor patient progress toward functional and medical goals  Care Tool:  Bathing    Body parts bathed by patient: Right arm, Left arm, Chest, Abdomen, Front perineal area, Buttocks, Right upper leg, Left upper leg, Face, Right lower leg   Body parts bathed by helper: Right lower leg Body parts n/a: Left lower leg   Bathing assist Assist Level: Independent with assistive device     Upper Body Dressing/Undressing Upper body dressing   What is  the patient wearing?: Pull over shirt    Upper body assist Assist Level: Independent with assistive device    Lower Body Dressing/Undressing Lower body dressing      What is the patient wearing?: Underwear/pull up, Pants, Ace wrap/stump shrinker     Lower body assist Assist for lower body dressing: Supervision/Verbal cueing Assistive Device Comment: increased time   Toileting Toileting    Toileting assist Assist for toileting: Supervision/Verbal cueing     Transfers Chair/bed transfer  Transfers assist     Chair/bed transfer assist level: Independent with assistive device     Locomotion Ambulation   Ambulation assist   Ambulation activity did not occur: Safety/medical concerns (fatigue, pain, decreased  balance)          Walk 10 feet activity   Assist  Walk 10 feet activity did not occur: Safety/medical concerns (fatigue, pain, decreased balance)        Walk 50 feet activity   Assist Walk 50 feet with 2 turns activity did not occur: Safety/medical concerns (fatigue, pain, decreased balance)         Walk 150 feet activity   Assist Walk 150 feet activity did not occur: Safety/medical concerns (fatigue, pain, decreased balance)         Walk 10 feet on uneven surface  activity   Assist Walk 10 feet on uneven surfaces activity did not occur: Safety/medical concerns (fatigue, pain, decreased balance)         Wheelchair     Assist Is the patient using a wheelchair?: Yes Type of Wheelchair: Manual    Wheelchair assist level: Independent Max wheelchair distance: 150'    Wheelchair 50 feet with 2 turns activity    Assist        Assist Level: Independent   Wheelchair 150 feet activity     Assist      Assist Level: Independent   Blood pressure 118/79, pulse 75, temperature 98.5 F (36.9 C), temperature source Oral, resp. rate 16, height 6' (1.829 m), weight 72.9 kg, SpO2 100%.  Medical Problem List and Plan: 1. Functional deficits secondary to left BKA 01/30/2024 after failed TMA with history of PAD status post PCI to left tibia/fibula angioplasty              -patient may  shower             -ELOS/Goals: 7 days with mod I goals for mobility and self-care Chart and therapy notes reviewed, continue CIR, discussed patient's progress with therapy Grounds pass ordered Vitamin D3/Metanx/Vitamin B/C complex ordered F/u with me or Fidela in clinic within 1 month Would benefit from home health aide upon discharge  Should have cardiology follow-up within 1 week because cardiology medications have been changed due to hypotension  Encouraged wife to work on ramp for home  -02/22/24 d/c held d/t dialysis issues, IR to place [facility] tomorrow  (Monday)  2.  Anemia: Hgb reviewed and has improved to 8.8, heparin  d/ced after discussion with IR due to bright red blood in cholesytotomy, stool occult ordered and is negative, continue aranesp , plavix  held this morning             -antiplatelet therapy: Aspirin  81 mg daily   3. Pain Management: decrease oxycodone  to 5mg  q12h given hypotension/fatigue Continue gabapentin   4. Insomnia: continue prn melatonin  5. Neuropsych/cognition: This patient is capable of making decisions on his own behalf.  -9/7pt appears clearer today. See above  6. Chole site irritation/yellow drainage: consulted IR -  Recent acute cholecystitis June 2025.  Currently maintained on Augmentin  and linezolid  for 3 to 4 weeks and will confirm duration of antibiotic.  Radiology following  for percutaneous cholecystostomy tube for recent cholecystitis. Consulted again today regarding bright red blood in ostomy  7. Fluids/Electrolytes/Nutrition: Routine in and out with follow-up chemistries  8.  Aortic stenosis.  Status post TAVR 01/20/2024  9.  End-stage renal disease.  Continue hemodialysis as per renal services             -HD at end of day to allow participation in therapy  - HD T TH S -02/22/24 no dialysis yesterday d/t issues with access-- IR to place [facility] tomorrow for dialysis, renal following.   10.  Hyperlipidemia.  Lipitor  11.  Hypotension: coreg  decreased to HS, imdur  decreased by nephrology to 30mg  daily.  Monitor with increased mobility  - 9-13: Blood pressure appears improved with decreased Imdur .    02/22/2024    5:00 AM 02/22/2024    4:45 AM 02/21/2024   10:02 PM  Vitals with BMI  Weight 160 lbs 11 oz    BMI 21.79    Systolic  118 121  Diastolic  79 60  Pulse  75 79   12.  Hypothyroidism.  continue Synthroid   13.  Acute on chronic anemia: see #2  14.  CAD with history of CABG.  No chest pain or shortness of breath.  Continue Imdur   15.  BPH: d/c flomax  given hypotension, resolved  16.   Chronic diastolic congestive heart failure.  Monitor for any signs of fluid overload.  Follow-up per cardiology services.             -daily weights             -volume mgt with HD, weight reviewed and is stable Filed Weights   02/21/24 0500 02/21/24 1405 02/22/24 0500  Weight: 71.6 kg 71 kg 72.9 kg    17.  Constipation: Last BM documented 9/17, but pt states LBM 9/19; monitor today, if no BM then might need additional meds going home. On colace 100mg  daily  -02/22/24 LBM this morning, monitor    LOS: 17 days A FACE TO FACE EVALUATION WAS PERFORMED  [address] 02/22/2024, 12:36 PM

## 2024-02-23 ENCOUNTER — Ambulatory Visit: Admitting: Physician Assistant

## 2024-02-23 ENCOUNTER — Ambulatory Visit (HOSPITAL_COMMUNITY)

## 2024-02-23 ENCOUNTER — Telehealth: Payer: Self-pay

## 2024-02-23 ENCOUNTER — Inpatient Hospital Stay (HOSPITAL_COMMUNITY)

## 2024-02-23 ENCOUNTER — Other Ambulatory Visit (HOSPITAL_COMMUNITY): Payer: Self-pay

## 2024-02-23 HISTORY — PX: IR TUNNELED CENTRAL VENOUS CATH PLC W IMG: IMG1939

## 2024-02-23 LAB — CBC
HCT: 27.8 % — ABNORMAL LOW (ref 39.0–52.0)
Hemoglobin: 8.7 g/dL — ABNORMAL LOW (ref 13.0–17.0)
MCH: 30.4 pg (ref 26.0–34.0)
MCHC: 31.3 g/dL (ref 30.0–36.0)
MCV: 97.2 fL (ref 80.0–100.0)
Platelets: 142 K/uL — ABNORMAL LOW (ref 150–400)
RBC: 2.86 MIL/uL — ABNORMAL LOW (ref 4.22–5.81)
RDW: 18.3 % — ABNORMAL HIGH (ref 11.5–15.5)
WBC: 5.5 K/uL (ref 4.0–10.5)
nRBC: 0 % (ref 0.0–0.2)

## 2024-02-23 LAB — RENAL FUNCTION PANEL
Albumin: 2.4 g/dL — ABNORMAL LOW (ref 3.5–5.0)
Anion gap: 12 (ref 5–15)
BUN: 66 mg/dL — ABNORMAL HIGH (ref 8–23)
CO2: 24 mmol/L (ref 22–32)
Calcium: 8.4 mg/dL — ABNORMAL LOW (ref 8.9–10.3)
Chloride: 98 mmol/L (ref 98–111)
Creatinine, Ser: 6.26 mg/dL — ABNORMAL HIGH (ref 0.61–1.24)
GFR, Estimated: 9 mL/min — ABNORMAL LOW (ref >60)
Glucose, Bld: 89 mg/dL (ref 70–99)
Phosphorus: 5.7 mg/dL — ABNORMAL HIGH (ref 2.5–4.6)
Potassium: 5.2 mmol/L — ABNORMAL HIGH (ref 3.5–5.1)
Sodium: 134 mmol/L — ABNORMAL LOW (ref 135–145)

## 2024-02-23 MED ORDER — FENTANYL CITRATE (PF) 100 MCG/2ML IJ SOLN
INTRAMUSCULAR | Status: AC
  Filled 2024-02-23: qty 2

## 2024-02-23 MED ORDER — LIDOCAINE HCL 1 % IJ SOLN
INTRAMUSCULAR | Status: AC
  Filled 2024-02-23: qty 20

## 2024-02-23 MED ORDER — FENTANYL CITRATE (PF) 100 MCG/2ML IJ SOLN
INTRAMUSCULAR | Status: AC | PRN
  Administered 2024-02-23: 25 ug via INTRAVENOUS
  Administered 2024-02-23: 50 ug via INTRAVENOUS
  Administered 2024-02-23: 25 ug via INTRAVENOUS

## 2024-02-23 MED ORDER — HEPARIN SODIUM (PORCINE) 1000 UNIT/ML IJ SOLN: 10.0000 mL | Freq: Once | INTRAMUSCULAR | Status: DC

## 2024-02-23 MED ORDER — HEPARIN SODIUM (PORCINE) 1000 UNIT/ML IJ SOLN
INTRAMUSCULAR | Status: AC
  Filled 2024-02-23: qty 10

## 2024-02-23 MED ORDER — MIDAZOLAM HCL 2 MG/2ML IJ SOLN
INTRAMUSCULAR | Status: AC | PRN
  Administered 2024-02-23: .5 mg via INTRAVENOUS
  Administered 2024-02-23: 1 mg via INTRAVENOUS

## 2024-02-23 MED ORDER — LIDOCAINE HCL 1 % IJ SOLN
20.0000 mL | Freq: Once | INTRAMUSCULAR | Status: AC
  Administered 2024-02-23: 10 mL

## 2024-02-23 MED ORDER — MIDAZOLAM HCL 2 MG/2ML IJ SOLN
INTRAMUSCULAR | Status: AC
  Filled 2024-02-23: qty 2

## 2024-02-23 MED ORDER — CEFAZOLIN SODIUM-DEXTROSE 2-4 GM/100ML-% IV SOLN
INTRAVENOUS | Status: AC | PRN
  Administered 2024-02-23: 2 g via INTRAVENOUS

## 2024-02-23 MED ORDER — HEPARIN SODIUM (PORCINE) 1000 UNIT/ML IJ SOLN: 4000.0000 [IU] | Freq: Once | INTRAMUSCULAR | Status: DC

## 2024-02-23 MED ORDER — CEFAZOLIN SODIUM-DEXTROSE 2-4 GM/100ML-% IV SOLN
INTRAVENOUS | Status: AC
  Filled 2024-02-23: qty 100

## 2024-02-23 NOTE — Procedures (Signed)
 Interventional Radiology Procedure Note  Procedure: Tunneled HD catheter placement  Complications: None  Estimated Blood Loss: < 10 mL  Findings: 19 cm tip to cuff Palindrome catheter via right internal jugular vein. Tip in RA. OK to use.  Marcey DASEN. Luverne, M.D Pager:  [phone]

## 2024-02-23 NOTE — Progress Notes (Signed)
 [facility] Progress Note   Subjective:  Seen in room. No complaints this am. To have TDC placed by IR today. Should be able to discharge afterwards.   Objective Vitals:   02/22/24 1422 02/22/24 2017 02/23/24 0455 02/23/24 0515  BP: (!) 111/57 124/66 126/71   Pulse: 66 70 71   Resp: 18 19 19    Temp: 98.1 F (36.7 C) 97.9 F (36.6 C) 98.3 F (36.8 C)   TempSrc:  Oral    SpO2: 100% 100% 100%   Weight:    71.2 kg  Height:       Physical Exam General: Well appearing man, NAD. Room air Heart: RRR Lungs: CTAB Abdomen: soft, chole tube in place Extremities: Trace LE edema Dialysis Access:  LUE AVF +t/b, + bruising  Additional Objective Labs: Basic Metabolic Panel: Recent Labs  Lab 02/17/24 1259 02/20/24 0730 02/23/24 0509  NA 132* 137 134*  K 4.6 4.4 5.2*  CL 98 105 98  CO2 26 24 24   GLUCOSE 163* 95 89  BUN 57* 56* 66*  CREATININE 6.57* 6.24* 6.26*  CALCIUM  8.7* 7.8* 8.4*  PHOS 2.7 4.7* 5.7*   Liver Function Tests: Recent Labs  Lab 02/17/24 1259 02/20/24 0730 02/23/24 0509  ALBUMIN  2.2* 2.1* 2.4*   CBC: Recent Labs  Lab 02/17/24 1259 02/18/24 1029 02/20/24 0730 02/23/24 0509  WBC 4.9  --  4.9 5.5  HGB 7.6* 8.8* 7.8* 8.7*  HCT 23.8* 28.1* 24.8* 27.8*  MCV 94.8  --  97.3 97.2  PLT 47*  --  104* 142*   Studies/Results: No results found.  Medications:   ceFAZolin  (ANCEF ) IV      (feeding supplement) PROSource Plus  30 mL Oral BID BM   sodium chloride    Intravenous Once   sodium chloride    Intravenous Once   amoxicillin -clavulanate  1 tablet Oral QHS   aspirin  EC  81 mg Oral Daily   atorvastatin   20 mg Oral Daily   B-complex with vitamin C  1 tablet Oral Daily   carvedilol   3.125 mg Oral QHS   Chlorhexidine  Gluconate Cloth  6 each Topical Q0600   cholecalciferol   1,000 Units Oral Daily   clopidogrel   75 mg Oral Daily   darbepoetin (ARANESP ) injection - DIALYSIS  100 mcg Subcutaneous Weekly   docusate sodium   100 mg Oral Daily    doxycycline   100 mg Oral Q12H   gabapentin   200 mg Oral QHS   isosorbide  mononitrate  30 mg Oral Daily   l-methylfolate-B6-B12  1 tablet Oral Daily   latanoprost   1 drop Both Eyes QHS   levothyroxine   50 mcg Oral Daily   nutrition supplement (JUVEN)  1 packet Oral BID BM   sevelamer  carbonate  800 mg Oral TID WC   sodium chloride  flush  5 mL Intracatheter Q8H    Dialysis Orders TTS [facility], [facility], [facility] 4hr, 350/800, EDW 81kg, 2K/2.5Ca, L AVF, no heparin  - no VDRA, ESA ordered but not yet given   Assessment/Plan: 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 Per [facility].  In CIR. Severe aortic stenosis s/p TAVR 01/20/24: Repeat echo 9/16 with persistent/worsened HF (EF 20-25%).  ESRD: HD TTS - Has been off schedule d/t access issues. No urgent HD indications today. Resume regular HD 9/23.  Vascular Access: S/p AVF infiltration and difficulty cannulating at his outpatient HD center. F'gram completed 9/8 - normal/patent AVF.  Ongoing difficulty using AVF.  Unable to cannulate  AVF again on 9/20. IR consulted for [facility] placement Monday - appreciate their assistance  HTN/volume: BP in goal. Continue carvedilol  3.125mg  BID and Imdur  30mg  every day. S/p amputation, will need lower edw on discharge.  Anemia of ESRD: Hgb 7-8. S/p 1U PRBCs on 9/13. Continue Aranesp  100mcg q Friday. Secondary HPTH: CorrCa ok and phos ok- continue sevelamer  as binder. Nutrition: Alb low, continue supps. Hx cholecystitis with perc chole drain still in place: Erythema/purulence around drain. S/p tube exchange 9/2. Remains on Augmentin . Repeat CT 9/14 without abscess or other changes to area, s/p exchange to larger tube 9/16. T2DM Dispo: In CIR. From renal standpoint ok for discharge after [facility] placed.    Phillip Ronnald Acosta PA-C [facility] 02/23/2024,8:21 AM

## 2024-02-23 NOTE — Progress Notes (Signed)
 Inpatient Rehabilitation Discharge Medication Review by a Pharmacist  A complete drug regimen review was completed for this patient to identify any potential clinically significant medication issues.  High Risk Drug Classes Is patient taking? Indication by Medication  Antipsychotic No   Anticoagulant No   Antibiotic Yes Doxycycline  / augmentin - acute cholecystitis  Opioid Yes Ultram  / oxyIR- acuter pain  Antiplatelet Yes Asa / plavix - cva ppx  Hypoglycemics/insulin  Yes Insulin - T2DM  Vasoactive Medication Yes Imdur , coreg - HTN NitroSL- angina Flomax - BPH  Chemotherapy No   Other Yes Robaxin - muscle spasms Neurontin - neuropathic pain Mircera- anemia Lipitor- HLD Xalatan - glaucoma Syhtropid- hypothyroidism Renvela - hyperphosphatemia     Type of Medication Issue Identified Description of Issue Recommendation(s)  Drug Interaction(s) (clinically significant)     Duplicate Therapy     Allergy     No Medication Administration End Date     Incorrect Dose     Additional Drug Therapy Needed     Significant med changes from prior encounter (inform family/care partners about these prior to discharge).    Other       Clinically significant medication issues were identified that warrant physician communication and completion of prescribed/recommended actions by midnight of the next day:  No  Time spent performing this drug regimen review (minutes):  30   Lennon Boutwell BS, PharmD, BCPS Clinical Pharmacist 02/23/2024 7:13 AM  Contact: [phone] after 3 PM

## 2024-02-23 NOTE — Progress Notes (Signed)
 PROGRESS NOTE   Subjective/Complaints: [facility] with discharge afterward Has no complaints   ROS: as per HPI. Denies CP, SOB, abd pain, N/V/D/C, or any other complaints at this time.   +fatigue  Objective:   No results found.    Recent Labs    02/23/24 0509  WBC 5.5  HGB 8.7*  HCT 27.8*  PLT 142*   Recent Labs    02/23/24 0509  NA 134*  K 5.2*  CL 98  CO2 24  GLUCOSE 89  BUN 66*  CREATININE 6.26*  CALCIUM  8.4*    Intake/Output Summary (Last 24 hours) at 02/23/2024 1240 Last data filed at 02/23/2024 0739 Gross per 24 hour  Intake 495 ml  Output 20 ml  Net 475 ml        Physical Exam: Vital Signs Blood pressure 111/64, pulse 70, temperature 98.3 F (36.8 C), resp. rate 10, height 6' (1.829 m), weight 71.2 kg, SpO2 100%.  Constitutional: No distress . Vital signs reviewed.  Sitting upright in w/c  HEENT: NCAT, EOMI, oral membranes moist Neck: supple Cardiovascular: RRR with ?murmur. No JVD    Respiratory/Chest: CTA Bilaterally without wheezes or rales. Normal effort    GI/Abdomen: BS +, non-tender, non-distended, soft, perc chole tube with greenish brown liquid inside bag Ext: no clubbing, cyanosis, or edema Psych: pleasant and cooperative but a little bummed he's not going home.   Skin: Left aVF, left BK with s/s drainage, gauze, ACE wrap in place + Left AV fistula, with palpable thrill + Left BKA site, with with clean overlying dressing, shrinker on + Right flank biliary tube with thin, milky drainage around wound, no drainage in bulb.  No expressible pus.  Mild surrounding erythema, no warmth or tenderness.  Neuro: Alert and oriented to person, place, reasons, month/year.  Speech clearer. Cranial nerves II through XII grossly intact. Bilateral upper extremity strength 5 out of 5. Right lower extremity strength 4 out of 5 proximal, 5 out of 5 distal. Left lower  extremity at least 4-5, stable 9/22 Sensation intact light touch in all extremities    Assessment/Plan: 1. Functional deficits which require 3+ hours per day of interdisciplinary therapy in a comprehensive inpatient rehab setting. Physiatrist is providing close team supervision and 24 hour management of active medical problems listed below. Physiatrist and rehab team continue to assess barriers to discharge/monitor patient progress toward functional and medical goals  Care Tool:  Bathing    Body parts bathed by patient: Right arm, Left arm, Chest, Abdomen, Front perineal area, Buttocks, Right upper leg, Left upper leg, Face, Right lower leg   Body parts bathed by helper: Right lower leg Body parts n/a: Left lower leg   Bathing assist Assist Level: Independent with assistive device     Upper Body Dressing/Undressing Upper body dressing   What is the patient wearing?: Pull over shirt    Upper body assist Assist Level: Independent with assistive device    Lower Body Dressing/Undressing Lower body dressing      What is the patient wearing?: Underwear/pull up, Pants, Ace wrap/stump shrinker     Lower body assist Assist for lower body dressing:  Supervision/Verbal cueing Assistive Device Comment: increased time   Toileting Toileting    Toileting assist Assist for toileting: Supervision/Verbal cueing     Transfers Chair/bed transfer  Transfers assist     Chair/bed transfer assist level: Independent with assistive device     Locomotion Ambulation   Ambulation assist   Ambulation activity did not occur: Safety/medical concerns (fatigue, pain, decreased balance)          Walk 10 feet activity   Assist  Walk 10 feet activity did not occur: Safety/medical concerns (fatigue, pain, decreased balance)        Walk 50 feet activity   Assist Walk 50 feet with 2 turns activity did not occur: Safety/medical concerns (fatigue, pain, decreased balance)          Walk 150 feet activity   Assist Walk 150 feet activity did not occur: Safety/medical concerns (fatigue, pain, decreased balance)         Walk 10 feet on uneven surface  activity   Assist Walk 10 feet on uneven surfaces activity did not occur: Safety/medical concerns (fatigue, pain, decreased balance)         Wheelchair     Assist Is the patient using a wheelchair?: Yes Type of Wheelchair: Manual    Wheelchair assist level: Independent Max wheelchair distance: 150'    Wheelchair 50 feet with 2 turns activity    Assist        Assist Level: Independent   Wheelchair 150 feet activity     Assist      Assist Level: Independent   Blood pressure 111/64, pulse 70, temperature 98.3 F (36.8 C), resp. rate 10, height 6' (1.829 m), weight 71.2 kg, SpO2 100%.  Medical Problem List and Plan: 1. Functional deficits secondary to left BKA 01/30/2024 after failed TMA with history of PAD status post PCI to left tibia/fibula angioplasty              -patient may  shower             -ELOS/Goals: 7 days with mod I goals for mobility and self-care Chart and therapy notes reviewed, continue CIR, discussed patient's progress with therapy Grounds pass ordered Vitamin D3/Metanx/Vitamin B/C complex ordered F/u with me or Fidela in clinic within 1 month Would benefit from home health aide upon discharge  Should have cardiology follow-up within 1 week because cardiology medications have been changed due to hypotension  Encouraged wife to work on ramp for home  -02/22/24 d/c held d/t dialysis issues, IR to place [facility] tomorrow (Monday)  2.  Anemia: Hgb reviewed and has improved to 8.8, heparin  d/ced after discussion with IR due to bright red blood in cholesytotomy, stool occult ordered and is negative, continue aranesp , plavix  held this morning             -antiplatelet therapy: Aspirin  81 mg daily   3. Pain Management: decrease oxycodone  to 5mg  q12h given  hypotension/fatigue Continue gabapentin   4. Insomnia: continue prn melatonin  5. Neuropsych/cognition: This patient is capable of making decisions on his own behalf.  -9/7pt appears clearer today. See above  6. Chole site irritation/yellow drainage: consulted IR - Recent acute cholecystitis June 2025.  Currently maintained on Augmentin  and linezolid  for 3 to 4 weeks and will confirm duration of antibiotic.  Radiology following  for percutaneous cholecystostomy tube for recent cholecystitis. Consulted again today regarding bright red blood in ostomy  7. Fluids/Electrolytes/Nutrition: Routine in and out with follow-up chemistries  8.  Aortic  stenosis.  Status post TAVR 01/20/2024  9.  End-stage renal disease.  Continue hemodialysis as per renal services             -HD at end of day to allow participation in therapy  - HD T TH S -02/22/24 no dialysis yesterday d/t issues with access-- IR to place [facility] tomorrow for dialysis, renal following.   10.  Hyperlipidemia.  Lipitor  11.  Hypotension: coreg  decreased to HS, imdur  decreased by nephrology to 30mg  daily.  Monitor with increased mobility  - 9-13: Blood pressure appears improved with decreased Imdur .    02/23/2024   12:33 PM 02/23/2024   12:30 PM 02/23/2024   12:25 PM  Vitals with BMI  Systolic 111 111 889  Diastolic 64 65 65  Pulse 70 69 68   12.  Hypothyroidism.  Continue Synthroid   13.  Acute on chronic anemia: see #2  14.  CAD with history of CABG.  No chest pain or shortness of breath.  continue Imdur   15.  BPH: d/c flomax  given hypotension, resolved  16.  Chronic diastolic congestive heart failure.  Monitor for any signs of fluid overload.  Follow-up per cardiology services.             -daily weights             -volume mgt with HD, weight reviewed and is stable Filed Weights   02/21/24 1405 02/22/24 0500 02/23/24 0515  Weight: 71 kg 72.9 kg 71.2 kg    17.  Constipation: Last BM documented 9/17, but pt states LBM  9/19; monitor today, if no BM then might need additional meds going home. Continue colace 100mg  daily   >30 minutes spent in discharge of patient including review of medications and follow-up appointments, physical examination, and in answering all patient's questions     LOS: 18 days A FACE TO FACE EVALUATION WAS PERFORMED  Phillip [name] 02/23/2024, 12:40 PM

## 2024-02-23 NOTE — Telephone Encounter (Signed)
 Transition Care Management Unsuccessful Follow-up Telephone Call  Date of discharge and from where:  [facility]  Attempts:  Patient re-admitted for Choledocholithiasis, left below the knee amputation.   Reason for unsuccessful TCM follow-up call:  Patient is currently in the hospital.

## 2024-02-23 NOTE — Progress Notes (Addendum)
 D/c orders noted. Contacted out-pt HD clinic, spoke with lou anne at this time and informed that pt will be d/c likely today and return to clinic Tuesday as scheduled. Will fax info over when available.   Lesleyanne Politte [facility] [phone]  Addendum 9/22, 2:28pm Faxed over d/c summ and last nephrology note at this time. No further support needed.

## 2024-02-24 ENCOUNTER — Other Ambulatory Visit (HOSPITAL_COMMUNITY)

## 2024-02-25 NOTE — Progress Notes (Signed)
 Inpatient Rehabilitation Care Coordinator Discharge Note   Patient Details  Name: Phillip [name] MRN: [id number] Date of Birth: 1955-06-08   Discharge location: D/c to home  Length of Stay: 17 days  Discharge activity level: wheelchair level Supervision  Home/community participation: Limited  Patient response un:Yzjouy Literacy - How often do you need to have someone help you when you read instructions, pamphlets, or other written material from your doctor or pharmacy?: Rarely  Patient response un:Dnrpjo Isolation - How often do you feel lonely or isolated from those around you?: Never  Services provided included: MD, RD, PT, OT, SLP, RN, CM, TR, Pharmacy, Neuropsych, SW  Financial Services:  Field seismologist Utilized: Private Insurance [employer]  Choices offered to/list presented to: patient  Follow-up services arranged:  DME Home Health Agency: [facility] for HHPT/OT    DME : [facility] for bariatric DABSC, TTB, w/c    Patient response to transportation need: Is the patient able to respond to transportation needs?: Yes In the past 12 months, has lack of transportation kept you from medical appointments or from getting medications?: No In the past 12 months, has lack of transportation kept you from meetings, work, or from getting things needed for daily living?: No   Patient/Family verbalized understanding of follow-up arrangements:  Yes  Individual responsible for coordination of the follow-up plan: contact pt wife [name] [phone]  Confirmed correct DME delivered: Graeme DELENA Jude 02/25/2024    Comments (or additional information):  Summary of Stay    Date/Time Discharge Planning CSW  02/18/24 1644 Patient plans to discharge home with wife, [name]. Family education held for 9/17. [name] will also provide transportation home. DME ordered: TTB, B-DAC, 18x18 WC DS  02/17/24 1349 Patient plans to discharge home with wife, [name]. Family education  planned for 9/17. [name] will also provide transportation home. DME ordered: TTB, B-DAC, 18x18 WC DS  02/17/24 1301 Patient plans to discharge home with wife, [name]. Family education planned for 9/17. [name] will also provide transportation home. DS  02/11/24 0915 Patient plans to discharge home with wife, [name]. [name] will also provide transportation home. Awaiting therapy DME and follow up recommendations. DS       Graeme DELENA Jude

## 2024-02-26 ENCOUNTER — Other Ambulatory Visit

## 2024-03-02 ENCOUNTER — Ambulatory Visit: Admitting: Cardiology

## 2024-03-03 ENCOUNTER — Ambulatory Visit: Attending: Cardiology | Admitting: Cardiology

## 2024-03-03 ENCOUNTER — Encounter: Payer: Self-pay | Admitting: Cardiology

## 2024-03-03 ENCOUNTER — Telehealth (HOSPITAL_COMMUNITY): Payer: Self-pay

## 2024-03-03 VITALS — BP 130/70 | HR 151 | Ht 72.0 in | Wt 178.0 lb

## 2024-03-03 DIAGNOSIS — E782 Mixed hyperlipidemia: Secondary | ICD-10-CM

## 2024-03-03 DIAGNOSIS — I10 Essential (primary) hypertension: Secondary | ICD-10-CM | POA: Diagnosis not present

## 2024-03-03 DIAGNOSIS — N186 End stage renal disease: Secondary | ICD-10-CM

## 2024-03-03 DIAGNOSIS — Z952 Presence of prosthetic heart valve: Secondary | ICD-10-CM | POA: Diagnosis not present

## 2024-03-03 DIAGNOSIS — Z951 Presence of aortocoronary bypass graft: Secondary | ICD-10-CM | POA: Diagnosis not present

## 2024-03-03 DIAGNOSIS — Z992 Dependence on renal dialysis: Secondary | ICD-10-CM

## 2024-03-03 MED ORDER — CARVEDILOL 6.25 MG PO TABS: ORAL_TABLET | ORAL | 3 refills | Status: DC

## 2024-03-03 NOTE — Patient Instructions (Signed)
 Medication Instructions:  Your physician has recommended you make the following change in your medication:   Stop Isosorbide   Increase your Carvedilol  to 6.25 mg twice daily   *If you need a refill on your cardiac medications before your next appointment, please call your pharmacy*   Lab Work: None ordered If you have labs (blood work) drawn today and your tests are completely normal, you will receive your results only by: MyChart Message (if you have MyChart) OR A paper copy in the mail If you have any lab test that is abnormal or we need to change your treatment, we will call you to review the results.   Testing/Procedures: None ordered   Follow-Up: At [facility], you and your health needs are our priority.  As part of our continuing mission to provide you with exceptional heart care, we have created designated Provider Care Teams.  These Care Teams include your primary Cardiologist (physician) and Advanced Practice Providers (APPs -  Physician Assistants and Nurse Practitioners) who all work together to provide you with the care you need, when you need it.  We recommend signing up for the patient portal called MyChart.  Sign up information is provided on this After Visit Summary.  MyChart is used to connect with patients for Virtual Visits (Telemedicine).  Patients are able to view lab/test results, encounter notes, upcoming appointments, etc.  Non-urgent messages can be sent to your provider as well.   To learn more about what you can do with MyChart, go to [email or web address].    Your next appointment:   3 month(s)  The format for your next appointment:   In Person  Provider:   Jennifer Crape, MD    Other Instructions none  Important Information About Sugar

## 2024-03-03 NOTE — Progress Notes (Signed)
 Cardiology Office Note:    Date:  03/03/2024   ID:  Phillip [name], DOB 1955/12/23, MRN [id number]  PCP:  [name] LABOR., MD  Cardiologist:  Jennifer JONELLE Crape, MD   Referring MD: [name] LABOR., MD    ASSESSMENT:    1. Essential hypertension   2. S/P TAVR (transcatheter aortic valve replacement)   3. S/P CABG x 4   4. Mixed hyperlipidemia   5. ESRD on dialysis [facility])    PLAN:    In order of problems listed above:  Coronary artery disease:Primary prevention stressed with the patient.  Importance of compliance with diet medication stressed and patient verbalized standing. In terms of elevated heart rate I told him to go to the nearest emergency room but he is vehemently against it.  In view of this I made the following recommendations.  Will stop his Imdur .  I will double carvedilol .  He has an appointment with Dr. Imelda with his primary care tomorrow and he will send me a copy of the EKG plus blood pressure from the doctor.  If he has any significant symptoms he promises to go to the ER. Mixed dyslipidemia: Lipid-lowering medications followed by primary care. End-stage renal disease on dialysis: Followed by nephrology. Patient will be seen in follow-up appointment in 6 months or earlier if the patient has any concerns.    Medication Adjustments/Labs and Tests Ordered: Current medicines are reviewed at length with the patient today.  Concerns regarding medicines are outlined above.  Orders Placed This Encounter  Procedures   EKG 12-Lead   No orders of the defined types were placed in this encounter.    No chief complaint on file.    History of Present Illness:    Phillip [name] is a 68 y.o. male.  Patient has past medical history of essential hypertension, coronary artery disease post CABG surgery, TAVR, hyperlipidemia.  He has end-stage disease of the kidneys and is on dialysis.  He tells me that he has fast heartbeats at times.  No chest pain orthopnea or PND.  At  the time of my evaluation, the patient is alert awake oriented and in no distress.  Past Medical History:  Diagnosis Date   Abdominopelvic abscess (HCC) 09/29/2018   Abscess of appendix 09/22/2018   Acquired spondylolisthesis of lumbosacral region 03/20/2020   Acute cholecystitis 11/15/2023   Anemia of chronic disease 08/18/2019   Anemia, chronic disease 08/18/2019   Aortic atherosclerosis    Aortic stenosis    a.) TTE 09/15/2018: mild-mod (MPG 19); b.) TTE 03/21/2021: mild-mod (MPG 14.3); c.) TTE 04/24/2022: mild- mod (MPG 15)   Appendicitis 09/08/2018   Asthma    Atherosclerosis of native arteries of the extremities with ulceration (HCC) 02/24/2018   Formatting of this note might be different from the original.  Last Assessment & Plan:   His ABIs today are 1.07 on the right and 0.99 on the left with biphasic waveforms.  Although these pressures may be somewhat elevated from medial calcification, his flow does appear to be reasonably good.  His left ABI was 0.58 prior to intervention.  At this point, we will stretch out his follow-up and see hi   Benign prostatic hyperplasia without lower urinary tract symptoms 01/14/2018   Bilateral lower extremity edema 11/04/2018   Bradycardia 03/25/2018   CAD (coronary artery disease) 2003   a.) s/p 4v CABG 2003   Choledocholithiasis 11/12/2023   Chronic combined systolic and diastolic CHF (congestive heart failure) (HCC) 07/26/2015  a.) TTE 09/15/2018: EF 50-55%, mod LVH, RVE, BAE, mild-mod TR, AoV sclerosis, G1DD; b.) MPI 10/27/2019: EF <30%; c.) TTE 03/21/2021: EF 35-40%, post AK, inf HK, mod LVH, mod red RVSF, mod LAE, mod Aov sclerosis, G2DD; c.) TTE 04/24/2022: EF 35-40%, post AK, glok HK, mod LVE, mod red RVSF, mild-mod MR, Aov sclerosis, G3DD   Chronic idiopathic constipation 11/02/2018   Chronic low back pain without sciatica 12/02/2018   Chronic pain of right hip 03/31/2020   Coronary artery disease    Diabetes mellitus without  complication (HCC)    Dysphagia 07/26/2015   Encounter for long-term (current) use of insulin  (HCC) 09/27/2020   Erectile dysfunction 07/14/2017   ESRD on dialysis (HCC) 11/15/2023   Essential hypertension 02/24/2018   Gangrene of toe of left foot (HCC) 01/14/2024   Hematuria 07/27/2015   History of marijuana use    Hyperlipidemia 07/26/2015   Hyperlipidemia associated with type 2 diabetes mellitus (HCC) 02/18/2020   Hypertension    Hypothyroidism (acquired) 11/14/2015   Insomnia 11/02/2018   Left below-knee amputee (HCC) 02/05/2024   Osseous and subluxation stenosis of intervertebral foramina of lumbar region 03/20/2020   Osteoarthritis 10/06/2019   Pancytopenia (HCC) 08/12/2023   Peripheral vascular disease    Personal history of tobacco use, presenting hazards to health 09/27/2020   Polyneuropathy in diabetes (HCC) 05/07/2019   Primary osteoarthritis involving multiple joints 10/06/2019   Primary osteoarthritis of right hip 02/21/2020   Protein-calorie malnutrition, severe 02/07/2024   Pulmonary HTN (HCC)    a.) TTE 05/12/2019: PASP 71; b.) TTE 09/27/2020: PASP >70; c.) TTE 03/21/2021: RVSP 43; d.) TTE 04/24/2022: RVSP 80.6   Puncture wound of right hip 04/02/2018   S/P CABG x 4 2003   S/P TAVR (transcatheter aortic valve replacement) 01/20/2024   TAVR with a 29 mm Edwards Sapien 3 Ultra Resilia THV via the TF by Dr. Wendel and Dr. Shyrl   Sleep apnea 07/26/2015   a.) does not require nocturnal PAP therapy   Subacute osteomyelitis of left foot (HCC) 03/25/2018   Thrombocytopenia    Type 2 diabetes mellitus with hyperlipidemia (HCC) 07/26/2015   Vitamin B12 deficiency 06/16/2019   Vitamin D  deficiency 05/07/2018    Past Surgical History:  Procedure Laterality Date   AMPUTATION Left 01/14/2024   Procedure: AMPUTATION, FOOT, PARTIAL;  Surgeon: Serene Gaile ORN, MD;  Location: [facility] OR;  Service: Vascular;  Laterality: Left;   AMPUTATION Left 01/30/2024   Procedure:  AMPUTATION BELOW KNEE;  Surgeon: Serene Gaile ORN, MD;  Location: [facility] OR;  Service: Vascular;  Laterality: Left;   AMPUTATION TOE Left 03/13/2018   Procedure: AMPUTATION TOE-MPJ;  Surgeon: Ashley Soulier, DPM;  Location: [facility] ORS;  Service: Podiatry;  Laterality: Left;   ANGIOPLASTY     AV FISTULA PLACEMENT Left 08/20/2023   Procedure: ARTERIOVENOUS (AV) FISTULA CREATION;  Surgeon: Gretta Lonni PARAS, MD;  Location: [facility] OR;  Service: Vascular;  Laterality: Left;   CARDIAC CATHETERIZATION     CORONARY ARTERY BYPASS GRAFT N/A 2003   INTRAOPERATIVE TRANSTHORACIC ECHOCARDIOGRAM N/A 01/20/2024   Procedure: ECHOCARDIOGRAM, TRANSTHORACIC;  Surgeon: Wendel Lurena POUR, MD;  Location: [facility] INVASIVE CV LAB;  Service: Cardiovascular;  Laterality: N/A;   IR CATHETER TUBE CHANGE  12/31/2023   IR DIALY SHUNT INTRO NEEDLE/INTRACATH INITIAL W/IMG LEFT Left 02/09/2024   IR EXCHANGE BILIARY DRAIN  02/03/2024   IR EXCHANGE BILIARY DRAIN  02/17/2024   IR FLUORO GUIDE CV LINE RIGHT  08/18/2023   IR PERC CHOLECYSTOSTOMY  11/12/2023  IR RADIOLOGIST EVAL & MGMT  12/31/2023   IR TUNNELED CENTRAL VENOUS CATH PLC W IMG  02/23/2024   IR US  GUIDE VASC ACCESS RIGHT  08/18/2023   LAPAROSCOPIC APPENDECTOMY N/A 09/08/2018   Procedure: APPENDECTOMY LAPAROSCOPIC;  Surgeon: Jordis Laneta FALCON, MD;  Location: [facility] ORS;  Service: General;  Laterality: N/A;   LOWER EXTREMITY ANGIOGRAPHY Left 03/02/2018   Procedure: LOWER EXTREMITY ANGIOGRAPHY;  Surgeon: Marea Selinda RAMAN, MD;  Location: [facility] INVASIVE CV LAB;  Service: Cardiovascular;  Laterality: Left;   LOWER EXTREMITY ANGIOGRAPHY Left 09/30/2022   Procedure: Lower Extremity Angiography;  Surgeon: Marea Selinda RAMAN, MD;  Location: [facility] INVASIVE CV LAB;  Service: Cardiovascular;  Laterality: Left;   LOWER EXTREMITY ANGIOGRAPHY Left 12/26/2023   Procedure: Lower Extremity Angiography;  Surgeon: Marea Selinda RAMAN, MD;  Location: [facility] INVASIVE CV LAB;  Service: Cardiovascular;  Laterality: Left;   RIGHT/LEFT  HEART CATH AND CORONARY/GRAFT ANGIOGRAPHY N/A 08/01/2023   Procedure: RIGHT/LEFT HEART CATH AND CORONARY/GRAFT ANGIOGRAPHY;  Surgeon: Wendel Lurena POUR, MD;  Location: [facility] INV[facility] CV LAB;  Service: Cardiovascular;  Laterality: N/A;   ROTATOR CUFF REPAIR Left    TRANSMETATARSAL AMPUTATION Left 01/16/2024   Procedure: AMPUTATION, FOOT, TRANSMETATARSAL;  Surgeon: Serene Gaile ORN, MD;  Location: [facility] OR;  Service: Vascular;  Laterality: Left;    Current Medications: Current Meds  Medication Sig   acetaminophen  (TYLENOL ) 325 MG tablet Take 2 tablets (650 mg total) by mouth every 6 (six) hours as needed for mild pain (pain score 1-3) or fever (or Fever >/= 101).   amoxicillin -clavulanate (AUGMENTIN ) 500-125 MG tablet Take 1 tablet by mouth 2 (two) times daily.   aspirin  EC 81 MG tablet Take 1 tablet (81 mg total) by mouth daily. Swallow whole.   atorvastatin  (LIPITOR) 20 MG tablet Take 1 tablet (20 mg total) by mouth daily.   B Complex-C (B-COMPLEX WITH VITAMIN C) tablet Take 1 tablet by mouth daily.   carvedilol  (COREG ) 3.125 MG tablet Take 1 tablet (3.125 mg total) by mouth at bedtime.   clopidogrel  (PLAVIX ) 75 MG tablet Take 1 tablet (75 mg total) by mouth daily.   docusate sodium  (COLACE) 100 MG capsule Take 1 capsule (100 mg total) by mouth daily.   doxycycline  (VIBRA -TABS) 100 MG tablet Take 1 tablet (100 mg total) by mouth every 12 (twelve) hours.   gabapentin  (NEURONTIN ) 100 MG capsule Take 2 capsules (200 mg total) by mouth at bedtime.   isosorbide  mononitrate (IMDUR ) 30 MG 24 hr tablet Take 1 tablet (30 mg total) by mouth daily.   latanoprost  (XALATAN ) 0.005 % ophthalmic solution Place 1 drop into both eyes at bedtime.   levothyroxine  (SYNTHROID ) 50 MCG tablet Take 1 tablet (50 mcg total) by mouth daily.   methocarbamol  (ROBAXIN ) 500 MG tablet Take 1 tablet (500 mg total) by mouth every 8 (eight) hours as needed for muscle spasms.   nitroGLYCERIN  (NITROSTAT ) 0.4 MG SL tablet Place 1 tablet  (0.4 mg total) under the tongue every 5 (five) minutes as needed for chest pain.   oxyCODONE  (OXY IR/ROXICODONE ) 5 MG immediate release tablet Take 1 tablet (5 mg total) by mouth every 12 (twelve) hours as needed for moderate pain (pain score 4-6).   sevelamer  carbonate (RENVELA ) 800 MG tablet Take 1 tablet (800 mg total) by mouth 3 (three) times daily with meals.   vitamin D3 (CHOLECALCIFEROL ) 25 MCG tablet Take 1 tablet (1,000 Units total) by mouth daily.     Allergies:   Patient has no known allergies.  Social History   Socioeconomic History   Marital status: Married    Spouse name: Not on file   Number of children: Not on file   Years of education: Not on file   Highest education level: Not on file  Occupational History   Not on file  Tobacco Use   Smoking status: Former    Current packs/day: 0.00    Average packs/day: 0.5 packs/day for 25.0 years (12.5 ttl pk-yrs)    Types: Cigarettes    Start date: 56    Quit date: 2003    Years since quitting: 22.7   Smokeless tobacco: Never  Vaping Use   Vaping status: Never Used  Substance and Sexual Activity   Alcohol use: Not Currently    Comment: 2003   Drug use: Never   Sexual activity: Not on file  Other Topics Concern   Not on file  Social History Narrative   Not on file   Social Drivers of Health   Financial Resource Strain: Low Risk  (09/26/2020)   Received from [facility]   Overall Financial Resource Strain (CARDIA)    Difficulty of Paying Living Expenses: Not hard at all  Food Insecurity: No Food Insecurity (02/12/2024)   Hunger Vital Sign    Worried About Running Out of Food in the Last Year: Never true    Ran Out of Food in the Last Year: Never true  Transportation Needs: No Transportation Needs (02/12/2024)   PRAPARE - Administrator, Civil Service (Medical): No    Lack of Transportation (Non-Medical): No  Physical Activity: Inactive (09/26/2020)   Received from [facility]   Exercise  Vital Sign    On average, how many days per week do you engage in moderate to strenuous exercise (like a brisk walk)?: 0 days    On average, how many minutes do you engage in exercise at this level?: 0 min  Stress: No Stress Concern Present (09/26/2020)   Received from [facility] - Occupational Stress Questionnaire    Feeling of Stress : Not at all  Social Connections: Moderately Integrated (02/12/2024)   Social Connection and Isolation Panel    Frequency of Communication with Friends and Family: More than three times a week    Frequency of Social Gatherings with Friends and Family: Twice a week    Attends Religious Services: More than 4 times per year    Active Member of [employer] or Organizations: No    Attends Engineer, structural: Never    Marital Status: Married     Family History: The patient's family history includes Diabetes in his mother; Heart attack in his brother; Heart disease in his brother; Hyperlipidemia in his mother; Hypertension in his mother; Lung disease in his father.  ROS:   Please see the history of present illness.    All other systems reviewed and are negative.  EKGs/Labs/Other Studies Reviewed:    The following studies were reviewed today: .SABRA   I discussed my findings with the patient at length.  EKG reveals wide QRS tachycardia.  He has baseline interventricular conduction delay.    Recent Labs: 07/14/2023: NT-Pro BNP 11,269 08/11/2023: B Natriuretic Peptide 865.0; TSH 7.948 01/26/2024: ALT <5 01/27/2024: Magnesium  2.3 02/23/2024: BUN 66; Creatinine, Ser 6.26; Hemoglobin 8.7; Platelets 142; Potassium 5.2; Sodium 134  Recent Lipid Panel    Component Value Date/Time   CHOL 77 (L) 08/10/2019 1043   TRIG 65  08/10/2019 1043   HDL 25 (L) 08/10/2019 1043   CHOLHDL 3.1 08/10/2019 1043   LDLCALC 37 08/10/2019 1043   LDLDIRECT 56 05/16/2023 1429    Physical Exam:    VS:  BP 130/70   Pulse (!) 151   Ht  6' (1.829 m)   Wt 178 lb (80.7 kg)   SpO2 99%   BMI 24.14 kg/m     Wt Readings from Last 3 Encounters:  03/03/24 178 lb (80.7 kg)  02/23/24 156 lb 15.5 oz (71.2 kg)  02/05/24 154 lb 15.7 oz (70.3 kg)     GEN: Patient is in no acute distress HEENT: Normal NECK: No JVD; No carotid bruits LYMPHATICS: No lymphadenopathy CARDIAC: Hear sounds regular, 2/6 systolic murmur at the apex. RESPIRATORY:  Clear to auscultation without rales, wheezing or rhonchi  ABDOMEN: Soft, non-tender, non-distended MUSCULOSKELETAL:  No edema; No deformity  SKIN: Warm and dry NEUROLOGIC:  Alert and oriented x 3 PSYCHIATRIC:  Normal affect   Signed, Jennifer JONELLE Crape, MD  03/03/2024 4:12 PM    [facility]

## 2024-03-03 NOTE — Telephone Encounter (Signed)
Called to schedule drain exchange, no answer, left vm. AB 

## 2024-03-04 ENCOUNTER — Other Ambulatory Visit: Payer: Self-pay | Admitting: Radiology

## 2024-03-04 DIAGNOSIS — K8020 Calculus of gallbladder without cholecystitis without obstruction: Secondary | ICD-10-CM

## 2024-03-04 NOTE — Progress Notes (Signed)
 Patient ID: PINKNEY VENARD, male   DOB: 22-Jun-1955, 68 y.o.   MRN: [id number] Patient's wife called today reporting her husbands cholecystostomy drain, which was placed 11/12/23 and most recent exchanged 02/17/24, was leaking. States that when they flush the drain or if the patient coughs or moves too much, there will be fluid draining around the drain insertion site. Patient is afebrile and reportedly otherwise feels fine.  Drain is known to have been leaking while inpatient, but wife says drainage is now significantly more, and occasionally with foul smell. There is still small amount of bilious output going into gravity bag. Wife reports patient has an appointment with his primary care team today and unable to come in, but is available tomorrow. Spoke with IR schedulers, and patient has 1 pm appointment time confirmed for tomorrow for drain evaluation.  Called back and spoke to wife, Tallis Soledad, who voices understanding and agreement. Patient/wife instructed to call back to IR if anything changes.   Kimble DEL Brynlea Spindler PA-C 03/04/2024 3:12 PM

## 2024-03-05 ENCOUNTER — Ambulatory Visit (HOSPITAL_COMMUNITY)
Admission: RE | Admit: 2024-03-05 | Discharge: 2024-03-05 | Disposition: A | Discharge status: Home / Self Care | Source: Ambulatory Visit | Attending: Radiology | Admitting: Radiology

## 2024-03-05 ENCOUNTER — Other Ambulatory Visit: Payer: Self-pay | Admitting: Radiology

## 2024-03-05 DIAGNOSIS — K8020 Calculus of gallbladder without cholecystitis without obstruction: Secondary | ICD-10-CM | POA: Diagnosis present

## 2024-03-05 HISTORY — PX: IR EXCHANGE BILIARY DRAIN: IMG6046

## 2024-03-05 MED ORDER — IOHEXOL 300 MG/ML  SOLN
50.0000 mL | Freq: Once | INTRAMUSCULAR | Status: AC | PRN
  Administered 2024-03-05: 15 mL

## 2024-03-05 MED ORDER — LIDOCAINE HCL 1 % IJ SOLN
INTRAMUSCULAR | Status: AC
  Filled 2024-03-05: qty 20

## 2024-03-05 MED ORDER — LIDOCAINE HCL 1 % IJ SOLN
20.0000 mL | Freq: Once | INTRAMUSCULAR | Status: AC
  Administered 2024-03-05: 10 mL

## 2024-03-05 NOTE — Procedures (Signed)
 Interventional Radiology Procedure:   Indications: Leaking cholecystostomy tube  Procedure: Cholecystostomy tube exchange  Findings: Stone filled gallbladder, old drain was in gallbladder lumen.  New 12 Fr drain placed in gallbladder.  Occluded cystic duct.  Complications: None     EBL: Minimal  Plan: Routine drain flushing and routine exchange in 6 weeks.  Jonella Redditt R. Philip, MD  Pager: [phone]

## 2024-03-08 ENCOUNTER — Encounter

## 2024-03-08 ENCOUNTER — Encounter: Payer: Self-pay | Admitting: Physician Assistant

## 2024-03-08 ENCOUNTER — Ambulatory Visit: Attending: Surgery | Admitting: Physician Assistant

## 2024-03-08 VITALS — BP 114/73 | HR 71 | Temp 97.9°F

## 2024-03-08 DIAGNOSIS — I739 Peripheral vascular disease, unspecified: Secondary | ICD-10-CM

## 2024-03-08 DIAGNOSIS — Z89512 Acquired absence of left leg below knee: Secondary | ICD-10-CM

## 2024-03-08 NOTE — Progress Notes (Signed)
 POST OPERATIVE OFFICE NOTE    CC:  F/u for surgery  HPI:  This is a 68 y.o. male who is s/p left below knee amputation on 01/30/2024 by Dr. Serene.  He previously underwent left 3rd toe amputation and subsequent left TMA prior to BKA.  He has hx of LLE interventions with vascular surgery at [facility].   Pt has hx of ESRD with left BC AVF created 08/20/2023 by Dr. Gretta.  He had a TDC placed in the hospital and has appt in [place] to have it removed.  He states his fistula is working well.    Pt returns today for follow up.  Pt states he has some burning pain in the stump.  There is no drainage.  He states he had a sore on the right 2nd toe.  He does not have pain in the right foot.     No Known Allergies  Current Outpatient Medications  Medication Sig Dispense Refill   acetaminophen  (TYLENOL ) 325 MG tablet Take 2 tablets (650 mg total) by mouth every 6 (six) hours as needed for mild pain (pain score 1-3) or fever (or Fever >/= 101).     amoxicillin -clavulanate (AUGMENTIN ) 500-125 MG tablet Take 1 tablet by mouth 2 (two) times daily. 20 tablet 0   aspirin  EC 81 MG tablet Take 1 tablet (81 mg total) by mouth daily. Swallow whole.     atorvastatin  (LIPITOR) 20 MG tablet Take 1 tablet (20 mg total) by mouth daily. 30 tablet 0   B Complex-C (B-COMPLEX WITH VITAMIN C) tablet Take 1 tablet by mouth daily. 30 tablet 0   carvedilol  (COREG ) 6.25 MG tablet Take 6.125 mg (1 tablet) in the morning and take 3.125 mg (0.5 tablet) in the evening. 135 tablet 3   clopidogrel  (PLAVIX ) 75 MG tablet Take 1 tablet (75 mg total) by mouth daily. 30 tablet 0   docusate sodium  (COLACE) 100 MG capsule Take 1 capsule (100 mg total) by mouth daily.     doxycycline  (VIBRA -TABS) 100 MG tablet Take 1 tablet (100 mg total) by mouth every 12 (twelve) hours. 20 tablet 0   gabapentin  (NEURONTIN ) 100 MG capsule Take 2 capsules (200 mg total) by mouth at bedtime. 60 capsule 0   latanoprost  (XALATAN ) 0.005 % ophthalmic  solution Place 1 drop into both eyes at bedtime. 2.5 mL 0   levothyroxine  (SYNTHROID ) 50 MCG tablet Take 1 tablet (50 mcg total) by mouth daily. 30 tablet 0   methocarbamol  (ROBAXIN ) 500 MG tablet Take 1 tablet (500 mg total) by mouth every 8 (eight) hours as needed for muscle spasms. 60 tablet 0   nitroGLYCERIN  (NITROSTAT ) 0.4 MG SL tablet Place 1 tablet (0.4 mg total) under the tongue every 5 (five) minutes as needed for chest pain. 25 tablet 0   oxyCODONE  (OXY IR/ROXICODONE ) 5 MG immediate release tablet Take 1 tablet (5 mg total) by mouth every 12 (twelve) hours as needed for moderate pain (pain score 4-6). 30 tablet 0   sevelamer  carbonate (RENVELA ) 800 MG tablet Take 1 tablet (800 mg total) by mouth 3 (three) times daily with meals. 90 tablet 0   vitamin D3 (CHOLECALCIFEROL ) 25 MCG tablet Take 1 tablet (1,000 Units total) by mouth daily. 30 tablet 0   No current facility-administered medications for this visit.     ROS:  See HPI  Physical Exam:  Today's Vitals   03/08/24 1053  BP: 114/73  Pulse: 71  Temp: 97.9 F (36.6 C)  TempSrc: Temporal  PainSc: 0-No pain   There is no height or weight on file to calculate BMI.   Incision:    Extremities:   Brisk monophasic doppler flow right DP/PT/pero  Right foot     Assessment/Plan:  This is a 68 y.o. male who is s/p: left below knee amputation on 01/30/2024 by Dr. Serene.  He previously underwent left 3rd toe amputation and subsequent left TMA prior to BKA.  He has hx of LLE interventions with vascular surgery at [facility].   -left BKA is healing.  I removed every other staple today.  Will have him return in a couple of weeks to have the rest of the staples removed.  Will need [facility] rx at that time.  He does have some burning pain in the stump.  He is on neurontin .  He states the dose has been lowered and encouraged him to speak with his PCP about dosing since they did lower his dose.  -he states he had a sore on the right 2nd  toe and this has improved as pictured above.  He does have a callus on the right 5th toe.  He has a podiatrist and encouraged him to continue with foot care with him.  Will keep a close eye on the right foot and get ABI in 3 months.  This was scheduled at today's visit. Encouraged him to protect his right foot.    Phillip [name], [facility] [phone]   Clinic MD:  Serene

## 2024-03-09 ENCOUNTER — Ambulatory Visit: Admitting: Cardiology

## 2024-03-10 ENCOUNTER — Other Ambulatory Visit: Payer: Self-pay

## 2024-03-10 DIAGNOSIS — I739 Peripheral vascular disease, unspecified: Secondary | ICD-10-CM

## 2024-03-11 ENCOUNTER — Other Ambulatory Visit (HOSPITAL_COMMUNITY): Payer: Self-pay

## 2024-03-15 NOTE — Telephone Encounter (Signed)
 I am so sorry that he is suffering with this.  However as he and I discussed this issue is not in my scope of treatment.  If he is throwing up and nauseated and doing worse he is probably best to go back to the hospital.  When I saw him he had the tube leaking around this.

## 2024-03-17 NOTE — Telephone Encounter (Signed)
 We have authorized tramadol  long term for him.  I presume he is wanting something stronger.  Long Term opiate pain pills are not allowed to be prescribed without a pain management agreement/contract.  I do this in a small amount of patient and no surgeons will do this.  I will do this for him. For this reason is why I recommended keeping his appointment to be seen the other day that he missed.  I will send in 10 pills, but he needs to return to make a plan and formulate this so I can make an agreement with him about these.  Alternatively he can see  a pain manager, especially if pain becomes difficult.  I can see him at 9:40 am tomorrow if willing.  What pain med is he using?

## 2024-03-18 ENCOUNTER — Other Ambulatory Visit: Payer: Self-pay | Admitting: Physician Assistant

## 2024-03-18 ENCOUNTER — Telehealth: Payer: Self-pay

## 2024-03-18 MED ORDER — DOXYCYCLINE HYCLATE 100 MG PO TABS: 100.0000 mg | ORAL_TABLET | Freq: Two times a day (BID) | ORAL | 0 refills | Status: DC

## 2024-03-18 NOTE — Telephone Encounter (Signed)
 lmtc

## 2024-03-18 NOTE — Telephone Encounter (Signed)
 Pt's wife, [name], called reporting new odor to pt's L BKA incision.  She reported no drainage from the incisional although there is a small spot this is yellow.  She also reported no fever. [name] is aware to keep the incision clean and dry.  Consulted [place], [place] and she ordered 5 days of 100 mg Doxycycline  bid.  Pt is aware to come for f/u appt on 03/22/24.  Pt is aware to go to the ED if the drainage increases or he becomes febrile.

## 2024-03-19 ENCOUNTER — Encounter: Admitting: Physical Medicine and Rehabilitation

## 2024-03-22 ENCOUNTER — Ambulatory Visit: Attending: Surgery | Admitting: Physician Assistant

## 2024-03-22 VITALS — BP 123/72 | HR 70 | Temp 98.0°F

## 2024-03-22 DIAGNOSIS — N186 End stage renal disease: Secondary | ICD-10-CM

## 2024-03-22 DIAGNOSIS — Z89512 Acquired absence of left leg below knee: Secondary | ICD-10-CM

## 2024-03-22 DIAGNOSIS — Z992 Dependence on renal dialysis: Secondary | ICD-10-CM

## 2024-03-22 DIAGNOSIS — I739 Peripheral vascular disease, unspecified: Secondary | ICD-10-CM

## 2024-03-22 MED ORDER — OXYCODONE HCL 5 MG PO TABS: 5.0000 mg | ORAL_TABLET | Freq: Four times a day (QID) | ORAL | 0 refills | Status: DC | PRN

## 2024-03-22 NOTE — Progress Notes (Signed)
 POST OPERATIVE OFFICE NOTE    CC:  F/u for surgery  HPI:  This is a 68 y.o. male who is s/p left below knee amputation on 01/30/24 by Dr. Serene.  He unfortunately had a non healing left TMA. He was seen on 03/08/24 and every other staple was removed at that visit.   Pt returns today for follow up incision check.  Pt states overall he is doing okay. He is having both phantom pains and also still some incisional pain. He describes it as burning and feeling like someone is taking a hammer to my leg. Pain is generally worse at night time. He reports very minimal drainage from staple line. He and wife have been washing it daily with soap and water. He has been using the shrinker and immobilizer as well. He says he is doing some exercises with the RLE and transferring but right leg remains week. He is getting PT 1 x/week at this point. He reports being started on Abx from his primary care which he just started on Saturday 10/18.   No Known Allergies  Current Outpatient Medications  Medication Sig Dispense Refill   acetaminophen  (TYLENOL ) 325 MG tablet Take 2 tablets (650 mg total) by mouth every 6 (six) hours as needed for mild pain (pain score 1-3) or fever (or Fever >/= 101).     amoxicillin -clavulanate (AUGMENTIN ) 500-125 MG tablet Take 1 tablet by mouth 2 (two) times daily. 20 tablet 0   aspirin  EC 81 MG tablet Take 1 tablet (81 mg total) by mouth daily. Swallow whole.     atorvastatin  (LIPITOR) 20 MG tablet Take 1 tablet (20 mg total) by mouth daily. 30 tablet 0   B Complex-C (B-COMPLEX WITH VITAMIN C) tablet Take 1 tablet by mouth daily. 30 tablet 0   carvedilol  (COREG ) 6.25 MG tablet Take 6.125 mg (1 tablet) in the morning and take 3.125 mg (0.5 tablet) in the evening. 135 tablet 3   clopidogrel  (PLAVIX ) 75 MG tablet Take 1 tablet (75 mg total) by mouth daily. 30 tablet 0   docusate sodium  (COLACE) 100 MG capsule Take 1 capsule (100 mg total) by mouth daily.     doxycycline  (VIBRA -TABS)  100 MG tablet Take 1 tablet (100 mg total) by mouth every 12 (twelve) hours. 10 tablet 0   gabapentin  (NEURONTIN ) 100 MG capsule Take 2 capsules (200 mg total) by mouth at bedtime. 60 capsule 0   latanoprost  (XALATAN ) 0.005 % ophthalmic solution Place 1 drop into both eyes at bedtime. 2.5 mL 0   levothyroxine  (SYNTHROID ) 50 MCG tablet Take 1 tablet (50 mcg total) by mouth daily. 30 tablet 0   methocarbamol  (ROBAXIN ) 500 MG tablet Take 1 tablet (500 mg total) by mouth every 8 (eight) hours as needed for muscle spasms. 60 tablet 0   nitroGLYCERIN  (NITROSTAT ) 0.4 MG SL tablet Place 1 tablet (0.4 mg total) under the tongue every 5 (five) minutes as needed for chest pain. 25 tablet 0   oxyCODONE  (OXY IR/ROXICODONE ) 5 MG immediate release tablet Take 1 tablet (5 mg total) by mouth every 12 (twelve) hours as needed for moderate pain (pain score 4-6). 30 tablet 0   sevelamer  carbonate (RENVELA ) 800 MG tablet Take 1 tablet (800 mg total) by mouth 3 (three) times daily with meals. 90 tablet 0   vitamin D3 (CHOLECALCIFEROL ) 25 MCG tablet Take 1 tablet (1,000 Units total) by mouth daily. 30 tablet 0   No current facility-administered medications for this visit.  ROS:  See HPI  Physical Exam:  Vitals:   03/22/24 0934  BP: 123/72  Pulse: 70  Temp: 98 F (36.7 C)  SpO2: 98%   Incision:  Left BKA remaining staples removed. There is small superficial skin separation with some fibrinous exudate on the anterior/lateral aspect of staple line. No drainage on compression. Does not appear acutely infected.  Extremities:  RLE remains well perfused and warm Neuro: alert and oriented    Assessment/Plan:  This is a 68 y.o. male who is s/p: left BKA on 01/30/24 by Dr. Serene. Left BKA is well appearing. Some scabbing along staple line. Small tiny area of separation. The remaining staples were removed today. BKA appears viable at this time. No signs of acute infection. He was started on Augmentin  by PCP so  advised him to complete course.  - I will send 1 refill of oxycodone  to his pharmacy. Encourage him to only take this when he really needs it or alternatively take tylenol  - Discussed importance of keeping right foot protected and check often for wounds. He will also follow up with Podiatrist as well - He will keep his follow up on 06/07/24 with ABI to follow his RLE - Follow up in 2-3 weeks for wound check on left BKA   Curry Damme, [facility] [phone]   On call MD:  Lanis

## 2024-03-26 ENCOUNTER — Other Ambulatory Visit (HOSPITAL_COMMUNITY): Payer: Self-pay

## 2024-04-05 ENCOUNTER — Telehealth: Payer: Self-pay

## 2024-04-05 NOTE — Telephone Encounter (Signed)
 Returning call to patients spouse.  Spouse calling wrong office.  Phone number to [facility] was given to patients spouse to call to schedule surgery for gallbladder removal.

## 2024-04-05 NOTE — Telephone Encounter (Signed)
 Pt's wife,[name], called to report patients left BKA incision has a small open spot that looks like it may be growing.  She reported the spot is smaller than the non-stick pad on a small bandaid.  She did report mild odor when she removed a band-aid on the wound.  Advised pt to not use band-aid and cover wound with clean, dry gauze.  She reported patient doesn't have a fever, no redness or swelling, no purulent drainage.  Advised pt to contact [facility] RN to ask for a PRN visit to assess the wound and call [facility] with her findings.   Pt completed round of Augmentin  but wasn't sure of the date of completion.  Advised pt to keep wound clean and dry and to contact should any signs of infection develop.   04/07/24  @ 1512 [name] called back expressing concern that pt's left BKA incision is opening more. She reported mild odor.  Patient offered an appt on 04/08/24 but pt couldn't come because of Dialysis Patient offered an appt on 04/10/23 but pt couldn't come because he is getting his TDC removed. Myles Lew to keep the would clean and dry and to come as scheduled on 04/14/24. Lew knows to go to the ED if the incision continues to dehisce or if patient develops signs of infection   04/08/24  [name] called back to schedule Triage appt to get wound assessed.  Appt 04/08/24 @ 1315.

## 2024-04-07 ENCOUNTER — Ambulatory Visit: Admitting: Cardiology

## 2024-04-08 ENCOUNTER — Ambulatory Visit: Attending: Vascular Surgery | Admitting: Physician Assistant

## 2024-04-08 ENCOUNTER — Other Ambulatory Visit: Payer: Self-pay

## 2024-04-08 VITALS — BP 121/71 | HR 79 | Temp 98.1°F

## 2024-04-08 DIAGNOSIS — T8789 Other complications of amputation stump: Secondary | ICD-10-CM

## 2024-04-08 DIAGNOSIS — Z89512 Acquired absence of left leg below knee: Secondary | ICD-10-CM

## 2024-04-08 DIAGNOSIS — I739 Peripheral vascular disease, unspecified: Secondary | ICD-10-CM

## 2024-04-08 MED ORDER — CEPHALEXIN 500 MG PO CAPS: 500.0000 mg | ORAL_CAPSULE | Freq: Three times a day (TID) | ORAL | 0 refills | Status: DC

## 2024-04-08 NOTE — Progress Notes (Signed)
 POST OPERATIVE OFFICE NOTE    CC:  F/u for surgery  HPI: Phillip [name] is a 68 y.o. male who is here as a triage visit.  He recently underwent left below-knee amputation on 01/30/2024 by Dr. Serene.  This was done for critical limb ischemia with tissue loss and a nonhealing left TMA.  He was seen at our office about 2 weeks ago with a small area of superficial skin separation on the anterolateral aspect of his staple line.  He returns today for repeat evaluation.  He says ever since the staples were removed from his amputation site, the area of incision dehiscence has gotten much larger.  Family states that they have noticed foul-smelling drainage on his bandages over the past couple of days.  He denies any worsening pain at his amputation site.  He denies any fevers or chills.  It has been a couple of weeks since he has finished his Augmentin  and doxycycline .   No Known Allergies  Current Outpatient Medications  Medication Sig Dispense Refill   acetaminophen  (TYLENOL ) 325 MG tablet Take 2 tablets (650 mg total) by mouth every 6 (six) hours as needed for mild pain (pain score 1-3) or fever (or Fever >/= 101).     amoxicillin -clavulanate (AUGMENTIN ) 500-125 MG tablet Take 1 tablet by mouth 2 (two) times daily. 20 tablet 0   aspirin  EC 81 MG tablet Take 1 tablet (81 mg total) by mouth daily. Swallow whole.     atorvastatin  (LIPITOR) 20 MG tablet Take 1 tablet (20 mg total) by mouth daily. 30 tablet 0   B Complex-C (B-COMPLEX WITH VITAMIN C) tablet Take 1 tablet by mouth daily. 30 tablet 0   carvedilol  (COREG ) 6.25 MG tablet Take 6.125 mg (1 tablet) in the morning and take 3.125 mg (0.5 tablet) in the evening. 135 tablet 3   clopidogrel  (PLAVIX ) 75 MG tablet Take 1 tablet (75 mg total) by mouth daily. 30 tablet 0   docusate sodium  (COLACE) 100 MG capsule Take 1 capsule (100 mg total) by mouth daily.     doxycycline  (VIBRA -TABS) 100 MG tablet Take 1 tablet (100 mg total) by mouth every 12  (twelve) hours. 10 tablet 0   gabapentin  (NEURONTIN ) 100 MG capsule Take 2 capsules (200 mg total) by mouth at bedtime. 60 capsule 0   latanoprost  (XALATAN ) 0.005 % ophthalmic solution Place 1 drop into both eyes at bedtime. 2.5 mL 0   levothyroxine  (SYNTHROID ) 50 MCG tablet Take 1 tablet (50 mcg total) by mouth daily. 30 tablet 0   methocarbamol  (ROBAXIN ) 500 MG tablet Take 1 tablet (500 mg total) by mouth every 8 (eight) hours as needed for muscle spasms. 60 tablet 0   nitroGLYCERIN  (NITROSTAT ) 0.4 MG SL tablet Place 1 tablet (0.4 mg total) under the tongue every 5 (five) minutes as needed for chest pain. 25 tablet 0   oxyCODONE  (OXY IR/ROXICODONE ) 5 MG immediate release tablet Take 1 tablet (5 mg total) by mouth every 6 (six) hours as needed for severe pain (pain score 7-10). 20 tablet 0   sevelamer  carbonate (RENVELA ) 800 MG tablet Take 1 tablet (800 mg total) by mouth 3 (three) times daily with meals. 90 tablet 0   vitamin D3 (CHOLECALCIFEROL ) 25 MCG tablet Take 1 tablet (1,000 Units total) by mouth daily. 30 tablet 0   No current facility-administered medications for this visit.     ROS:  See HPI  Physical Exam:  Incision: Worsened dehiscence of anterolateral aspect of the left BKA with  slough and fibrinous exudate present in the wound bed.  There is a small area of tunneling within the medial aspect of the wound bed, which goes superiorly about 0.25 inches.  No expressible purulence Extremities: RLE warm and well-perfused Neuro: Alert and oriented x 3      Assessment/Plan:  This is a 68 y.o. male who presents as a triage visit  - The patient recently underwent left below-knee amputation by Dr. Serene on 8/29 for critical limb ischemia with tissue loss due to poorly healing TMA -The patient presents today for a triage visit.  At his last office visit all of his staples were removed from his incision.  There was one very small area of dehiscence at the anterolateral aspect of his  incision.  Over the past 2 weeks this area has gotten much larger.  He also endorses new onset foul-smelling drainage from the area over the past couple of days.  He denies any fevers or chills -On exam his left BKA stump is healing appropriately medially.  There is a large area of dehiscence at the anterolateral aspect of the stump with fibrinous exudate and slough present.  I cannot express any drainage from the wound.  The medial aspect of the wound bed appears to have a small area of tunneling, which goes superiorly for about 0.25 inches. - I have explained to the patient that his below-knee amputation is unlikely to heal at this state without returning to the OR.  He does not want to pursue conversion to above-knee amputation at this time.  I have explained to the patient that we may be able to help his BKA heal with wound debridement and likely wound VAC placement.  He is agreeable to pursue surgery. - We will schedule the patient for left below-knee amputation washout, debridement, and wound VAC placement on Monday with any available surgeon.  I will also start him on Keflex which he can take over the weekend   Ahmed Holster, [place] [facility] [phone]   Clinic MD:  Lanis

## 2024-04-09 ENCOUNTER — Encounter: Attending: Physical Medicine and Rehabilitation | Admitting: Physical Medicine and Rehabilitation

## 2024-04-09 NOTE — Progress Notes (Signed)
 SDW attempt.  Multiple attempts to reach patient; 2 messages, VM full and wife's phone just rings.  Contacted Alan Glance, RN at Dr. Lanis' office. Patient is aware to arrive at 1300 and hold plavix .  Unable to verify medications, allergies, diabetic blood sugar ranges with instructions, last dose of plavix , what medications to hold or take DOS, to be NPO, hygiene, height weight etc.

## 2024-04-09 NOTE — Anesthesia Preprocedure Evaluation (Addendum)
 Anesthesia Evaluation  Patient identified by MRN, date of birth, ID band Patient awake    Reviewed: Allergy & Precautions, NPO status , Patient's Chart, lab work & pertinent test results  Airway Mallampati: II  TM Distance: >3 FB Neck ROM: Full    Dental  (+) Edentulous Lower, Edentulous Upper   Pulmonary former smoker   Pulmonary exam normal        Cardiovascular hypertension, Pt. on home beta blockers pulmonary hypertension+ angina  + CAD, + CABG, + Peripheral Vascular Disease and +CHF  Normal cardiovascular exam+ Valvular Problems/Murmurs (s/p TAVR in 2025) AS   Echo 02/17/2024: IMPRESSIONS  1. Left ventricular ejection fraction, by estimation, is 25 to 30%. The left ventricle has severely decreased function. The left ventricle  demonstrates global hypokinesis. The left ventricular internal cavity size was mildly dilated. Left ventricular diastolic parameters are indeterminate.  2. Right ventricular systolic function is mildly reduced. The right  ventricular size is normal. There is mildly elevated pulmonary artery  systolic pressure. The tricuspid regurgitant velocity  is 2.95 m/s, and with an assumed right atrial pressure of 3 mmHg, the estimated right ventricular systolic pressure is 37.8 mmHg.  3. Left atrial size was mildly dilated.  4. The mitral valve is normal in structure. Mild to moderate mitral valve regurgitation.  5. The aortic valve has been repaired/replaced. Aortic valve  regurgitation is not visualized. There is a 29 mm Sapien prosthetic (TAVR) valve present in the aortic position. Aortic valve area, by VTI measures 2.62 cm. Aortic valve mean gradient  measures 8.0 mmHg. Aortic valve Vmax measures 2.00 m/s.  6. The inferior vena cava is normal in size with greater than 50%  respiratory variability, suggesting right atrial pressure of 3 mmHg.     Neuro/Psych    GI/Hepatic negative GI ROS, Neg liver  ROS,,,  Endo/Other  diabetes, Insulin  DependentHypothyroidism    Renal/GU ESRF and DialysisRenal diseaseOn HD T, R, Sat     Musculoskeletal   Abdominal   Peds  Hematology  (+) Blood dyscrasia (Plavix ), anemia   Anesthesia Other Findings Non-healing amputation site  Reproductive/Obstetrics                              Anesthesia Physical Anesthesia Plan  ASA: 4  Anesthesia Plan: General   Post-op Pain Management:    Induction: Intravenous  PONV Risk Score and Plan: 2 and Ondansetron , Dexamethasone  and Treatment may vary due to age or medical condition  Airway Management Planned: Oral ETT  Additional Equipment:   Intra-op Plan:   Post-operative Plan: Extubation in OR  Informed Consent: I have reviewed the patients History and Physical, chart, labs and discussed the procedure including the risks, benefits and alternatives for the proposed anesthesia with the patient or authorized representative who has indicated his/her understanding and acceptance.     Dental advisory given  Plan Discussed with: CRNA  Anesthesia Plan Comments: (PAT note written 04/09/2024 by Allison Zelenak, PA-C.  )         Anesthesia Quick Evaluation

## 2024-04-09 NOTE — Progress Notes (Signed)
 Anesthesia Chart Review: Phillip [name]  Case: [id number] Date/Time: 04/12/24 1522   Procedures:      IRRIGATION AND DEBRIDEMENT WOUND (Left)     APPLICATION, WOUND VAC (Left)   Anesthesia type: General   Diagnosis: Non-healing amputation site (HCC) [T87.89]   Pre-op  diagnosis: NON HEALING WOUND   [facility] / [facility]   Surgeons: Lanis Fonda BRAVO, MD       DISCUSSION: Patient is a 68 year old male scheduled for the above procedure. He has an extensive cardiac history and was scheduled for TAVR for severe AS, but this was delayed due to left foot gangrene. He underwent left 3rd toe amputation 01/14/2024 but required  left TMA 01/16/2024 and ultimately left BKA on 01/30/2024 for non-healing. He did undergo TAVR on 01/20/2024. As of August 2025, he was being considered for future cholecystectomy after recovery from his TAVR, but this is currently still being managed by a cholecystotomy drain--last exchanged 03/05/2024 but noted to be at risk for leaking due to the stone filled gallbladder.  History includes former smoker (quit 06/03/2001), HTN, HLD, CAD (CABG 2003, type 2 NSTEMI 09/27/2020; LIMA-LAD patent with occluded vein grafts 08/01/2023), chronic combined CHF, murmur (AR/severe AS, s/p 29 mm TAVR 01/30/2024; mild-moderate MR 02/2024), pulmonary hypertension (moderate 66/21 with mean 39 08/01/2023), bradycardia, DM2 (with neuropathy), hypothyroidism, PVD (right TP trunk, peroneal, popliteal artery angioplasties 08/30/2013 & on left  03/02/2018 , 09/30/2022; right great toe amputation 03/13/2018; left TP trunk stent 12/26/2023; left 3rd toe amputation 01/14/2024, left TMA 01/16/2024, left BKA 01/30/2024), right carotid bruits, CKD (left brachiocephalic AVF creation 08/20/2023, HD initiated 08/2023, TTS), anemia, thrombocytopenia, asthma, GERD, OSA, appendectomy (09/08/2018), cholecystitis (s/p cholecystostomy tube 11/12/2023; consider cholecystectomy vs drain capping following recovery from TAVR).   He had [facility]  triage follow-up on 04/08/2024 for increased dehiscence of BKA site with malodorous drainage despite completing courses of Augmentin  and doxycycline . The above procedure is planned.   Last cardiology follow-up was on 03/03/2024 with Dr. Revankar. He was in WCT at rate of 151 bpm. He was not having symptoms. Patient refused to go to the ED. His Imdur  was discontinued and Carvedilol  dose doubled. He was in agreement to follow-up with PCP the next day and if still tachycardic or with symptoms he would be agreeable to go to the ED.  HR was documented as 80 bpm with BP 120/68 at 03/04/2024 visit with PCP Dr. Thurmond.   Anesthesia team to evaluate on the day of surgery. He is nearly 3 months post-TAVR. Plavix  is listed as not currently taking Plavix  (Patient Preference). He is on ASA 81 mg daily. He is also on Lipitor and Coreg . He undergoes HD TTS. He continues with cystostomy tube for gallbladder disease. Unfortunately, his left BKA site continued to have issues with dehiscence and drainage, so above procedure is planned. He is for updated labs and EKG on arrival as indicated. As above, 03/03/2024 EKG at cardiology follow-up showed WCT at 151 bpm which was managed with medication adjustments as he refused ED evaluation.     VS:  Wt Readings from Last 3 Encounters:  03/03/24 80.7 kg  02/23/24 71.2 kg  02/05/24 70.3 kg   BP Readings from Last 3 Encounters:  04/08/24 121/71  03/22/24 123/72  03/08/24 114/73   Pulse Readings from Last 3 Encounters:  04/08/24 79  03/22/24 70  03/08/24 71     PROVIDERS: [name] LABOR., MD is PCP Edwyna Backers, MD is primary cardiologist Wendel Haws, MD is  structural heart cardiologist Dasie Best, MD is general surgeon Marea Mayo, MD is primary vascular surgeon   LABS: Most recent lab in [facility] include: Lab Results  Component Value Date   WBC 5.5 02/23/2024   HGB 8.7 (L) 02/23/2024   HCT 27.8 (L) 02/23/2024   PLT 142 (L) 02/23/2024   GLUCOSE 89  02/23/2024   ALT <5 01/26/2024   AST 17 01/26/2024   NA 134 (L) 02/23/2024   K 5.2 (H) 02/23/2024   CL 98 02/23/2024   CREATININE 6.26 (H) 02/23/2024   BUN 66 (H) 02/23/2024   CO2 24 02/23/2024   TSH 7.948 (H) 08/11/2023   INR 1.1 02/09/2024   HGBA1C 5.8 (H) 11/13/2023    IMAGES:  CTA chest/abd/pelvis 02/15/2024:  IMPRESSION: 1. Percutaneous cholecystostomy with pigtail tip in the gallbladder fundus. 2. No hydronephrosis or nephrolithiasis. 3. No bowel obstruction. 4.  Aortic Atherosclerosis (ICD10-I70.0).     EKG:  EKG 03/03/2024: Wide QRS tachycardia at 151 bpm Right bundle branch block Left posterior fascicular block Bifascicular block Possible Inferior infarct , age undetermined Marked T wave abnormality, consider lateral ischemia Abnormal ECG When compared with ECG of 24-Jan-2024 17:41, Wide QRS tachycardia has replaced Ectopic atrial rhythm Vent. rate has increased BY 67 BPM  EKG 01/24/2024: Junctional rhythm at 84 bpm Left bundle branch block Abnormal ECG When compared with ECG of 21-Jan-2024 03:45, sinus rhythm no longer present Confirmed by Lonni Slain [phone]) on 01/25/2024 4:18:00 PM    CV: Echo 02/17/2024: IMPRESSIONS   1. Left ventricular ejection fraction, by estimation, is 25 to 30%. The left ventricle has severely decreased function. The left ventricle  demonstrates global hypokinesis. The left ventricular internal cavity size was mildly dilated. Left ventricular diastolic parameters are indeterminate.   2. Right ventricular systolic function is mildly reduced. The right  ventricular size is normal. There is mildly elevated pulmonary artery  systolic pressure. The tricuspid regurgitant velocity   is 2.95 m/s, and with an assumed right atrial pressure of 3 mmHg, the estimated right ventricular systolic pressure is 37.8 mmHg.   3. Left atrial size was mildly dilated.   4. The mitral valve is normal in structure. Mild to moderate mitral valve  regurgitation.   5. The aortic valve has been repaired/replaced. Aortic valve  regurgitation is not visualized. There is a 29 mm Sapien prosthetic (TAVR) valve present in the aortic position. Aortic valve area, by VTI measures 2.62 cm. Aortic valve mean gradient  measures 8.0 mmHg. Aortic valve Vmax measures 2.00 m/s.   6. The inferior vena cava is normal in size with greater than 50%  respiratory variability, suggesting right atrial pressure of 3 mmHg.  - Comparison:  - 01/21/2024: LVEF 30-35%, inferior and posterior wall akinesis, moderate LV dilation, mild concentric LVH, grade 1 DD, RV systolic function moderately reduced, mild-moderate MR, trivial TR, AV has been repaired/replaced, trivial perivalvular leak at 2 o'clock, no AR, no AS - 09/11/2023: LVEF 25-30% with global LV hypokinesis, moderate AS, mild AR, mild-moderate MR, severe pulmonology HTN - 07/02/23 TTE [facility]: LVEF 30-35%, inferior and inferolateral akinesis, grad II DD, mild MR, mild AR, low-flow low gradient severe AS, mild TR, severe pulmonary HTN, estimated PASP 68 mmHg   CT Cardiac (pre-TAVR) 10/10/2023: IMPRESSION: 1. Calcified tri leaflet AV with annulus 591 mm2 suitable for a 29 mm Sapien valve. Alternatively a 34 mm Medtronic valve can be considered 2.  Coronary arteries sufficient height above annulus for deployment 3.  Occluded SVG;s.  Patient LIMA to  LAD 4.  Membranous septal length 8.1 mm 5.  Optimum angiographic angle for deployment RAO 3 Caudal 3 degrees 6.  AV calcium  score 2670      RHC/LHC 08/01/2023:    Origin to Prox Graft lesion is 100% stenosed.   Origin to Prox Graft lesion is 100% stenosed.   Origin to Prox Graft lesion is 100% stenosed.   Ost LAD to Prox LAD lesion is 100% stenosed.   Ost RCA to Prox RCA lesion is 100% stenosed.   Mid Cx lesion is 99% stenosed.   1.  Severe native vessel disease. 2.  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  These presumably go to the diagonal, PDA, and  obtuse marginal systems. 3.  Widely patent LIMA to LAD. 4.  Fick cardiac output of 6.9 L/min and Fick cardiac index of 3.0 L/min/m with the following hemodynamics:            Right atrial pressure mean of 16 mmHg            Right ventricular pressure 74/-1 with an end-diastolic pressure of 18 mmHg            PA pressure of 66/21 with a mean of 39 mmHg            Wedge pressure mean of 28 mmHg with V waves to 33 mmHg            PVR 1.6 Wood units            PA pulsatility index of 2.5 5.  Total dye expenditure of 40 cc   Recommendation: Continue evaluation for potential aortic valve intervention.    Past Medical History:  Diagnosis Date   Abdominopelvic abscess (HCC) 09/29/2018   Abscess of appendix 09/22/2018   Acquired spondylolisthesis of lumbosacral region 03/20/2020   Acute cholecystitis 11/15/2023   Anemia of chronic disease 08/18/2019   Anemia, chronic disease 08/18/2019   Aortic atherosclerosis    Aortic stenosis    a.) TTE 09/15/2018: mild-mod (MPG 19); b.) TTE 03/21/2021: mild-mod (MPG 14.3); c.) TTE 04/24/2022: mild- mod (MPG 15)   Appendicitis 09/08/2018   Asthma    Atherosclerosis of native arteries of the extremities with ulceration (HCC) 02/24/2018   Formatting of this note might be different from the original.  Last Assessment & Plan:   His ABIs today are 1.07 on the right and 0.99 on the left with biphasic waveforms.  Although these pressures may be somewhat elevated from medial calcification, his flow does appear to be reasonably good.  His left ABI was 0.58 prior to intervention.  At this point, we will stretch out his follow-up and see hi   Benign prostatic hyperplasia without lower urinary tract symptoms 01/14/2018   Bilateral lower extremity edema 11/04/2018   Bradycardia 03/25/2018   CAD (coronary artery disease) 2003   a.) s/p 4v CABG 2003   Choledocholithiasis 11/12/2023   Chronic combined systolic and diastolic CHF (congestive heart failure) (HCC)  07/26/2015   a.) TTE 09/15/2018: EF 50-55%, mod LVH, RVE, BAE, mild-mod TR, AoV sclerosis, G1DD; b.) MPI 10/27/2019: EF <30%; c.) TTE 03/21/2021: EF 35-40%, post AK, inf HK, mod LVH, mod red RVSF, mod LAE, mod Aov sclerosis, G2DD; c.) TTE 04/24/2022: EF 35-40%, post AK, glok HK, mod LVE, mod red RVSF, mild-mod MR, Aov sclerosis, G3DD   Chronic idiopathic constipation 11/02/2018   Chronic low back pain without sciatica 12/02/2018   Chronic pain of right hip 03/31/2020  Coronary artery disease    Diabetes mellitus without complication (HCC)    Dysphagia 07/26/2015   Encounter for long-term (current) use of insulin  (HCC) 09/27/2020   Erectile dysfunction 07/14/2017   ESRD on dialysis (HCC) 11/15/2023   Essential hypertension 02/24/2018   Gangrene of toe of left foot (HCC) 01/14/2024   Hematuria 07/27/2015   History of marijuana use    Hyperlipidemia 07/26/2015   Hyperlipidemia associated with type 2 diabetes mellitus (HCC) 02/18/2020   Hypertension    Hypothyroidism (acquired) 11/14/2015   Insomnia 11/02/2018   Left below-knee amputee (HCC) 02/05/2024   Osseous and subluxation stenosis of intervertebral foramina of lumbar region 03/20/2020   Osteoarthritis 10/06/2019   Pancytopenia (HCC) 08/12/2023   Peripheral vascular disease    Personal history of tobacco use, presenting hazards to health 09/27/2020   Polyneuropathy in diabetes (HCC) 05/07/2019   Primary osteoarthritis involving multiple joints 10/06/2019   Primary osteoarthritis of right hip 02/21/2020   Protein-calorie malnutrition, severe 02/07/2024   Pulmonary HTN (HCC)    a.) TTE 05/12/2019: PASP 71; b.) TTE 09/27/2020: PASP >70; c.) TTE 03/21/2021: RVSP 43; d.) TTE 04/24/2022: RVSP 80.6   Puncture wound of right hip 04/02/2018   S/P CABG x 4 2003   S/P TAVR (transcatheter aortic valve replacement) 01/20/2024   TAVR with a 29 mm Edwards Sapien 3 Ultra Resilia THV via the TF by Dr. Wendel and Dr. Shyrl   Sleep apnea  07/26/2015   a.) does not require nocturnal PAP therapy   Subacute osteomyelitis of left foot (HCC) 03/25/2018   Thrombocytopenia    Type 2 diabetes mellitus with hyperlipidemia (HCC) 07/26/2015   Vitamin B12 deficiency 06/16/2019   Vitamin D  deficiency 05/07/2018    Past Surgical History:  Procedure Laterality Date   AMPUTATION Left 01/14/2024   Procedure: AMPUTATION, FOOT, PARTIAL;  Surgeon: Serene Gaile ORN, MD;  Location[facility];  Service: Vascular;  Laterality: Left;   AMPUTATION Left 01/30/2024   Procedure: AMPUTATION BELOW KNEE;  Surgeon: Serene Gaile ORN, MD;  Location: [facility];  Service: Vascular;  Laterality: Left;   AMPUTATION TOE Left 03/13/2018   Procedure: AMPUTATION TOE-MPJ;  Surgeon: Ashley Soulier, DPM;  Location: [facility] ORS;  Service: Podiatry;  Laterality: Left;   ANGIOPLASTY     AV FISTULA PLACEMENT Left 08/20/2023   Procedure: ARTERIOVENOUS (AV) FISTULA CREATION;  Surgeon: Gretta Lonni PARAS, MD;  Location: [facility] OR;  Service: Vascular;  Laterality: Left;   CARDIAC CATHETERIZATION     CORONARY ARTERY BYPASS GRAFT N/A 2003   INTRAOPERATIVE TRANSTHORACIC ECHOCARDIOGRAM N/A 01/20/2024   Procedure: ECHOCARDIOGRAM, TRANSTHORACIC;  Surgeon: Wendel Lurena POUR, MD;  [facility];  Service: Cardiovascular;  Laterality: N/A;   IR CATHETER TUBE CHANGE  12/31/2023   IR DIALY SHUNT INTRO NEEDLE/INTRACATH INITIAL W/IMG LEFT Left 02/09/2024   IR EXCHANGE BILIARY DRAIN  02/03/2024   IR EXCHANGE BILIARY DRAIN  02/17/2024   IR EXCHANGE BILIARY DRAIN  03/05/2024   IR FLUORO GUIDE CV LINE RIGHT  08/18/2023   IR PERC CHOLECYSTOSTOMY  11/12/2023   IR RADIOLOGIST EVAL & MGMT  12/31/2023   IR TUNNELED CENTRAL VENOUS CATH PLC W IMG  02/23/2024   IR US  GUIDE VASC ACCESS RIGHT  08/18/2023   LAPAROSCOPIC APPENDECTOMY N/A 09/08/2018   Procedure: APPENDECTOMY LAPAROSCOPIC;  Surgeon: Jordis Laneta FALCON, MD;  Location: [facility] ORS;  Service: General;  Laterality: N/A;   LOWER EXTREMITY ANGIOGRAPHY Left  03/02/2018   Procedure: LOWER EXTREMITY ANGIOGRAPHY;  Surgeon: Marea Selinda RAMAN,  MD;  Location: [facility] INVASIVE CV LAB;  Service: Cardiovascular;  Laterality: Left;   LOWER EXTREMITY ANGIOGRAPHY Left 09/30/2022   Procedure: Lower Extremity Angiography;  Surgeon: Marea Selinda RAMAN, MD;  Location: [facility] CV [facility];  Service: Cardiovascular;  Laterality: Left;   LOWER EXTREMITY ANGIOGRAPHY Left 12/26/2023   Procedure: Lower Extremity Angiography;  Surgeon: Marea Selinda RAMAN, MD;  Location: [facility] INVASIVE CV LAB;  Service: Cardiovascular;  Laterality: Left;   RIGHT/LEFT HEART CATH AND CORONARY/GRAFT ANGIOGRAPHY N/A 08/01/2023   Procedure: RIGHT/LEFT HEART CATH AND CORONARY/GRAFT ANGIOGRAPHY;  Surgeon: Wendel Lurena POUR, MD;  [facility];  Service: Cardiovascular;  Laterality: N/A;   ROTATOR CUFF REPAIR Left    TRANSMETATARSAL AMPUTATION Left 01/16/2024   Procedure: AMPUTATION, FOOT, TRANSMETATARSAL;  Surgeon: Serene Gaile ORN, MD;  Location: [facility];  Service: Vascular;  Laterality: Left;    MEDICATIONS: No current facility-administered medications for this encounter.    acetaminophen  (TYLENOL ) 325 MG tablet   aspirin  EC 81 MG tablet   atorvastatin  (LIPITOR) 20 MG tablet   B Complex-C (B-COMPLEX WITH VITAMIN C) tablet   carvedilol  (COREG ) 6.25 MG tablet   gabapentin  (NEURONTIN ) 100 MG capsule   levothyroxine  (SYNTHROID ) 50 MCG tablet   nitroGLYCERIN  (NITROSTAT ) 0.4 MG SL tablet   oxyCODONE  (OXY IR/ROXICODONE ) 5 MG immediate release tablet   sevelamer  carbonate (RENVELA ) 800 MG tablet   vitamin D3 (CHOLECALCIFEROL ) 25 MCG tablet   cephALEXin (KEFLEX) 500 MG capsule   clopidogrel  (PLAVIX ) 75 MG tablet   docusate sodium  (COLACE) 100 MG capsule   latanoprost  (XALATAN ) 0.005 % ophthalmic solution    Isaiah Ruder, PA-C Surgical Short Stay/Anesthesiology [facility] Phone [phone] [facility] Phone [phone] 04/09/2024 11:44 AM

## 2024-04-12 ENCOUNTER — Inpatient Hospital Stay (HOSPITAL_COMMUNITY)
Admission: AD | Admit: 2024-04-12 | Discharge: 2024-04-15 | DRG: 463 | Disposition: A | Discharge status: Home Health | Attending: Family Medicine | Admitting: Family Medicine

## 2024-04-12 ENCOUNTER — Encounter (HOSPITAL_COMMUNITY): Payer: Self-pay | Admitting: Vascular Surgery

## 2024-04-12 ENCOUNTER — Encounter (HOSPITAL_COMMUNITY): Admission: RE | Disposition: A | Payer: Self-pay | Source: Home / Self Care | Attending: Hospitalist

## 2024-04-12 ENCOUNTER — Ambulatory Visit (HOSPITAL_COMMUNITY): Payer: Self-pay | Admitting: Vascular Surgery

## 2024-04-12 ENCOUNTER — Other Ambulatory Visit: Payer: Self-pay

## 2024-04-12 DIAGNOSIS — I70229 Atherosclerosis of native arteries of extremities with rest pain, unspecified extremity: Secondary | ICD-10-CM | POA: Diagnosis present

## 2024-04-12 DIAGNOSIS — Z8249 Family history of ischemic heart disease and other diseases of the circulatory system: Secondary | ICD-10-CM

## 2024-04-12 DIAGNOSIS — E1122 Type 2 diabetes mellitus with diabetic chronic kidney disease: Secondary | ICD-10-CM | POA: Diagnosis present

## 2024-04-12 DIAGNOSIS — T8754 Necrosis of amputation stump, left lower extremity: Secondary | ICD-10-CM

## 2024-04-12 DIAGNOSIS — Z79890 Hormone replacement therapy (postmenopausal): Secondary | ICD-10-CM

## 2024-04-12 DIAGNOSIS — D696 Thrombocytopenia, unspecified: Secondary | ICD-10-CM | POA: Diagnosis not present

## 2024-04-12 DIAGNOSIS — M542 Cervicalgia: Secondary | ICD-10-CM | POA: Diagnosis not present

## 2024-04-12 DIAGNOSIS — T8781 Dehiscence of amputation stump: Secondary | ICD-10-CM | POA: Diagnosis present

## 2024-04-12 DIAGNOSIS — Z833 Family history of diabetes mellitus: Secondary | ICD-10-CM

## 2024-04-12 DIAGNOSIS — Z992 Dependence on renal dialysis: Secondary | ICD-10-CM

## 2024-04-12 DIAGNOSIS — I2510 Atherosclerotic heart disease of native coronary artery without angina pectoris: Secondary | ICD-10-CM | POA: Diagnosis present

## 2024-04-12 DIAGNOSIS — E8809 Other disorders of plasma-protein metabolism, not elsewhere classified: Secondary | ICD-10-CM | POA: Diagnosis present

## 2024-04-12 DIAGNOSIS — N186 End stage renal disease: Secondary | ICD-10-CM | POA: Diagnosis present

## 2024-04-12 DIAGNOSIS — Z83438 Family history of other disorder of lipoprotein metabolism and other lipidemia: Secondary | ICD-10-CM

## 2024-04-12 DIAGNOSIS — E039 Hypothyroidism, unspecified: Secondary | ICD-10-CM | POA: Diagnosis present

## 2024-04-12 DIAGNOSIS — Z89422 Acquired absence of other left toe(s): Secondary | ICD-10-CM

## 2024-04-12 DIAGNOSIS — E1151 Type 2 diabetes mellitus with diabetic peripheral angiopathy without gangrene: Secondary | ICD-10-CM | POA: Diagnosis present

## 2024-04-12 DIAGNOSIS — N400 Enlarged prostate without lower urinary tract symptoms: Secondary | ICD-10-CM | POA: Diagnosis present

## 2024-04-12 DIAGNOSIS — I25119 Atherosclerotic heart disease of native coronary artery with unspecified angina pectoris: Secondary | ICD-10-CM

## 2024-04-12 DIAGNOSIS — Z951 Presence of aortocoronary bypass graft: Secondary | ICD-10-CM

## 2024-04-12 DIAGNOSIS — Z953 Presence of xenogenic heart valve: Secondary | ICD-10-CM

## 2024-04-12 DIAGNOSIS — I350 Nonrheumatic aortic (valve) stenosis: Secondary | ICD-10-CM | POA: Diagnosis present

## 2024-04-12 DIAGNOSIS — Z794 Long term (current) use of insulin: Secondary | ICD-10-CM | POA: Diagnosis not present

## 2024-04-12 DIAGNOSIS — I5042 Chronic combined systolic (congestive) and diastolic (congestive) heart failure: Secondary | ICD-10-CM | POA: Diagnosis present

## 2024-04-12 DIAGNOSIS — Z89512 Acquired absence of left leg below knee: Principal | ICD-10-CM

## 2024-04-12 DIAGNOSIS — T8789 Other complications of amputation stump: Secondary | ICD-10-CM

## 2024-04-12 DIAGNOSIS — D631 Anemia in chronic kidney disease: Secondary | ICD-10-CM | POA: Diagnosis present

## 2024-04-12 DIAGNOSIS — N189 Chronic kidney disease, unspecified: Secondary | ICD-10-CM | POA: Diagnosis not present

## 2024-04-12 DIAGNOSIS — Z79899 Other long term (current) drug therapy: Secondary | ICD-10-CM

## 2024-04-12 DIAGNOSIS — T8130XA Disruption of wound, unspecified, initial encounter: Secondary | ICD-10-CM | POA: Diagnosis not present

## 2024-04-12 DIAGNOSIS — Z7982 Long term (current) use of aspirin: Secondary | ICD-10-CM

## 2024-04-12 DIAGNOSIS — Z862 Personal history of diseases of the blood and blood-forming organs and certain disorders involving the immune mechanism: Secondary | ICD-10-CM | POA: Diagnosis not present

## 2024-04-12 DIAGNOSIS — K5904 Chronic idiopathic constipation: Secondary | ICD-10-CM | POA: Diagnosis present

## 2024-04-12 DIAGNOSIS — I132 Hypertensive heart and chronic kidney disease with heart failure and with stage 5 chronic kidney disease, or end stage renal disease: Secondary | ICD-10-CM | POA: Diagnosis present

## 2024-04-12 DIAGNOSIS — Z9861 Coronary angioplasty status: Secondary | ICD-10-CM

## 2024-04-12 DIAGNOSIS — E1169 Type 2 diabetes mellitus with other specified complication: Secondary | ICD-10-CM | POA: Diagnosis present

## 2024-04-12 DIAGNOSIS — K829 Disease of gallbladder, unspecified: Secondary | ICD-10-CM | POA: Diagnosis present

## 2024-04-12 DIAGNOSIS — E7849 Other hyperlipidemia: Secondary | ICD-10-CM | POA: Diagnosis present

## 2024-04-12 DIAGNOSIS — J45909 Unspecified asthma, uncomplicated: Secondary | ICD-10-CM | POA: Diagnosis present

## 2024-04-12 DIAGNOSIS — N2581 Secondary hyperparathyroidism of renal origin: Secondary | ICD-10-CM | POA: Diagnosis present

## 2024-04-12 DIAGNOSIS — M1611 Unilateral primary osteoarthritis, right hip: Secondary | ICD-10-CM | POA: Diagnosis present

## 2024-04-12 DIAGNOSIS — Z87891 Personal history of nicotine dependence: Secondary | ICD-10-CM

## 2024-04-12 DIAGNOSIS — Y835 Amputation of limb(s) as the cause of abnormal reaction of the patient, or of later complication, without mention of misadventure at the time of the procedure: Secondary | ICD-10-CM | POA: Diagnosis present

## 2024-04-12 DIAGNOSIS — E1142 Type 2 diabetes mellitus with diabetic polyneuropathy: Secondary | ICD-10-CM | POA: Diagnosis present

## 2024-04-12 DIAGNOSIS — Z9049 Acquired absence of other specified parts of digestive tract: Secondary | ICD-10-CM

## 2024-04-12 HISTORY — PX: INCISION AND DRAINAGE OF WOUND: SHX1803

## 2024-04-12 HISTORY — PX: APPLICATION OF WOUND VAC: SHX5189

## 2024-04-12 LAB — POCT I-STAT, CHEM 8
BUN: 47 mg/dL — ABNORMAL HIGH (ref 8–23)
Calcium, Ion: 0.99 mmol/L — ABNORMAL LOW (ref 1.15–1.40)
Chloride: 100 mmol/L (ref 98–111)
Creatinine, Ser: 4.9 mg/dL — ABNORMAL HIGH (ref 0.61–1.24)
Glucose, Bld: 94 mg/dL (ref 70–99)
HCT: 31 % — ABNORMAL LOW (ref 39.0–52.0)
Hemoglobin: 10.5 g/dL — ABNORMAL LOW (ref 13.0–17.0)
Potassium: 3.9 mmol/L (ref 3.5–5.1)
Sodium: 140 mmol/L (ref 135–145)
TCO2: 31 mmol/L (ref 22–32)

## 2024-04-12 LAB — BRAIN NATRIURETIC PEPTIDE: B Natriuretic Peptide: 736.6 pg/mL — ABNORMAL HIGH (ref 0.0–100.0)

## 2024-04-12 LAB — CBC
HCT: 31.6 % — ABNORMAL LOW (ref 39.0–52.0)
Hemoglobin: 10.1 g/dL — ABNORMAL LOW (ref 13.0–17.0)
MCH: 31.4 pg (ref 26.0–34.0)
MCHC: 32 g/dL (ref 30.0–36.0)
MCV: 98.1 fL (ref 80.0–100.0)
Platelets: 108 K/uL — ABNORMAL LOW (ref 150–400)
RBC: 3.22 MIL/uL — ABNORMAL LOW (ref 4.22–5.81)
RDW: 14.8 % (ref 11.5–15.5)
WBC: 5.7 K/uL (ref 4.0–10.5)
nRBC: 0 % (ref 0.0–0.2)

## 2024-04-12 LAB — GLUCOSE, CAPILLARY
Glucose-Capillary: 125 mg/dL — ABNORMAL HIGH (ref 70–99)
Glucose-Capillary: 79 mg/dL (ref 70–99)
Glucose-Capillary: 89 mg/dL (ref 70–99)

## 2024-04-12 LAB — CREATININE, SERUM
Creatinine, Ser: 5.23 mg/dL — ABNORMAL HIGH (ref 0.61–1.24)
GFR, Estimated: 11 mL/min — ABNORMAL LOW (ref >60)

## 2024-04-12 SURGERY — IRRIGATION AND DEBRIDEMENT WOUND
Anesthesia: General | Laterality: Left

## 2024-04-12 MED ORDER — AMISULPRIDE (ANTIEMETIC) 5 MG/2ML IV SOLN: 10.0000 mg | Freq: Once | INTRAVENOUS | Status: DC | PRN

## 2024-04-12 MED ORDER — CHLORHEXIDINE GLUCONATE 4 % EX SOLN: 60.0000 mL | Freq: Once | CUTANEOUS | Status: DC

## 2024-04-12 MED ORDER — ENOXAPARIN SODIUM 40 MG/0.4ML IJ SOSY
40.0000 mg | PREFILLED_SYRINGE | INTRAMUSCULAR | Status: DC
  Administered 2024-04-13: 40 mg via SUBCUTANEOUS
  Filled 2024-04-12: qty 0.4

## 2024-04-12 MED ORDER — CEFAZOLIN SODIUM-DEXTROSE 2-4 GM/100ML-% IV SOLN
2.0000 g | INTRAVENOUS | Status: AC
  Administered 2024-04-12: 2 g via INTRAVENOUS
  Filled 2024-04-12: qty 100

## 2024-04-12 MED ORDER — OXYCODONE-ACETAMINOPHEN 5-325 MG PO TABS: 1.0000 | ORAL_TABLET | ORAL | Status: DC | PRN

## 2024-04-12 MED ORDER — 0.9 % SODIUM CHLORIDE (POUR BTL) OPTIME
TOPICAL | Status: DC | PRN
  Administered 2024-04-12: 2000 mL

## 2024-04-12 MED ORDER — LIDOCAINE 2% (20 MG/ML) 5 ML SYRINGE
INTRAMUSCULAR | Status: DC | PRN
  Administered 2024-04-12: 100 mg via INTRAVENOUS

## 2024-04-12 MED ORDER — DEXAMETHASONE SOD PHOSPHATE PF 10 MG/ML IJ SOLN
INTRAMUSCULAR | Status: DC | PRN
  Administered 2024-04-12: 4 mg via INTRAVENOUS

## 2024-04-12 MED ORDER — CHLORHEXIDINE GLUCONATE 0.12 % MT SOLN
OROMUCOSAL | Status: AC
  Administered 2024-04-12: 15 mL via OROMUCOSAL
  Filled 2024-04-12: qty 15

## 2024-04-12 MED ORDER — FENTANYL CITRATE (PF) 250 MCG/5ML IJ SOLN
INTRAMUSCULAR | Status: AC
  Filled 2024-04-12: qty 5

## 2024-04-12 MED ORDER — CARVEDILOL 6.25 MG PO TABS
6.2500 mg | ORAL_TABLET | Freq: Two times a day (BID) | ORAL | Status: DC
  Administered 2024-04-13 – 2024-04-14 (×3): 6.25 mg via ORAL
  Filled 2024-04-12 (×3): qty 1

## 2024-04-12 MED ORDER — EPHEDRINE SULFATE-NACL 50-0.9 MG/10ML-% IV SOSY
PREFILLED_SYRINGE | INTRAVENOUS | Status: DC | PRN
  Administered 2024-04-12: 10 mg via INTRAVENOUS

## 2024-04-12 MED ORDER — GABAPENTIN 300 MG PO CAPS
300.0000 mg | ORAL_CAPSULE | Freq: Three times a day (TID) | ORAL | Status: DC
  Administered 2024-04-12 – 2024-04-15 (×8): 300 mg via ORAL
  Filled 2024-04-12 (×8): qty 1

## 2024-04-12 MED ORDER — FENTANYL CITRATE (PF) 250 MCG/5ML IJ SOLN
INTRAMUSCULAR | Status: DC | PRN
Start: 2024-04-12 — End: 2024-04-12
  Administered 2024-04-12: 100 ug via INTRAVENOUS

## 2024-04-12 MED ORDER — INSULIN ASPART 100 UNIT/ML IJ SOLN: 0.0000 [IU] | INTRAMUSCULAR | Status: DC | PRN

## 2024-04-12 MED ORDER — SUGAMMADEX SODIUM 200 MG/2ML IV SOLN
INTRAVENOUS | Status: DC | PRN
  Administered 2024-04-12: 200 mg via INTRAVENOUS

## 2024-04-12 MED ORDER — SODIUM CHLORIDE 0.9 % IV SOLN: INTRAVENOUS | Status: DC

## 2024-04-12 MED ORDER — SEVELAMER CARBONATE 800 MG PO TABS
800.0000 mg | ORAL_TABLET | Freq: Three times a day (TID) | ORAL | Status: DC
  Administered 2024-04-13 – 2024-04-14 (×6): 800 mg via ORAL
  Filled 2024-04-12 (×6): qty 1

## 2024-04-12 MED ORDER — LEVOTHYROXINE SODIUM 50 MCG PO TABS
50.0000 ug | ORAL_TABLET | Freq: Every day | ORAL | Status: DC
  Administered 2024-04-13 – 2024-04-15 (×3): 50 ug via ORAL
  Filled 2024-04-12 (×3): qty 1

## 2024-04-12 MED ORDER — PROPOFOL 10 MG/ML IV BOLUS
INTRAVENOUS | Status: DC | PRN
  Administered 2024-04-12: 70 mg via INTRAVENOUS

## 2024-04-12 MED ORDER — PROPOFOL 10 MG/ML IV BOLUS
INTRAVENOUS | Status: AC
Start: 2024-04-12 — End: 2024-04-12
  Filled 2024-04-12: qty 20

## 2024-04-12 MED ORDER — FENTANYL CITRATE (PF) 100 MCG/2ML IJ SOLN: 25.0000 ug | INTRAMUSCULAR | Status: DC | PRN

## 2024-04-12 MED ORDER — VASHE WOUND IRRIGATION OPTIME
TOPICAL | Status: DC | PRN
  Administered 2024-04-12: 34 [oz_av]

## 2024-04-12 MED ORDER — OXYCODONE HCL 5 MG PO TABS
5.0000 mg | ORAL_TABLET | ORAL | Status: DC | PRN
  Administered 2024-04-12 – 2024-04-15 (×11): 5 mg via ORAL
  Filled 2024-04-12 (×10): qty 1

## 2024-04-12 MED ORDER — ROCURONIUM BROMIDE 10 MG/ML (PF) SYRINGE
PREFILLED_SYRINGE | INTRAVENOUS | Status: DC | PRN
  Administered 2024-04-12: 50 mg via INTRAVENOUS

## 2024-04-12 MED ORDER — ACETAMINOPHEN 325 MG PO TABS
650.0000 mg | ORAL_TABLET | Freq: Four times a day (QID) | ORAL | Status: DC | PRN
  Administered 2024-04-14 – 2024-04-15 (×2): 650 mg via ORAL
  Filled 2024-04-12: qty 2

## 2024-04-12 MED ORDER — HYDROMORPHONE HCL 1 MG/ML IJ SOLN: 0.5000 mg | INTRAMUSCULAR | Status: DC | PRN

## 2024-04-12 MED ORDER — ONDANSETRON HCL 4 MG/2ML IJ SOLN
INTRAMUSCULAR | Status: DC | PRN
  Administered 2024-04-12: 4 mg via INTRAVENOUS

## 2024-04-12 MED ORDER — CHLORHEXIDINE GLUCONATE 0.12 % MT SOLN: 15.0000 mL | Freq: Once | OROMUCOSAL | Status: AC

## 2024-04-12 MED ORDER — ATORVASTATIN CALCIUM 10 MG PO TABS
20.0000 mg | ORAL_TABLET | Freq: Every day | ORAL | Status: DC
  Administered 2024-04-12 – 2024-04-15 (×4): 20 mg via ORAL
  Filled 2024-04-12 (×4): qty 2

## 2024-04-12 MED ORDER — ACETAMINOPHEN 10 MG/ML IV SOLN: 1000.0000 mg | Freq: Once | INTRAVENOUS | Status: DC | PRN

## 2024-04-12 MED ORDER — ONDANSETRON HCL 4 MG/2ML IJ SOLN: 4.0000 mg | Freq: Once | INTRAMUSCULAR | Status: DC | PRN

## 2024-04-12 MED ORDER — ORAL CARE MOUTH RINSE: 15.0000 mL | Freq: Once | OROMUCOSAL | Status: AC

## 2024-04-12 SURGICAL SUPPLY — 42 items
BAG COUNTER SPONGE SURGICOUNT (BAG) ×1 IMPLANT
BNDG ELASTIC 4X5.8 VLCR STR LF (GAUZE/BANDAGES/DRESSINGS) IMPLANT
BNDG ELASTIC 6INX 5YD STR LF (GAUZE/BANDAGES/DRESSINGS) IMPLANT
BNDG GAUZE DERMACEA FLUFF 4 (GAUZE/BANDAGES/DRESSINGS) IMPLANT
CANISTER SUCTION 3000ML PPV (SUCTIONS) ×1 IMPLANT
CANISTER WOUNDNEG PRESSURE 500 (CANNISTER) IMPLANT
CLEANSER WND VASHE INSTL 34OZ (WOUND CARE) IMPLANT
CLIP TI MEDIUM 6 (CLIP) ×2 IMPLANT
CLIP TI WIDE RED SMALL 6 (CLIP) ×1 IMPLANT
COVER SURGICAL LIGHT HANDLE (MISCELLANEOUS) ×1 IMPLANT
DRAPE HALF SHEET 40X57 (DRAPES) IMPLANT
DRAPE INCISE IOBAN 66X45 STRL (DRAPES) ×1 IMPLANT
DRAPE SURG ORHT 6 SPLT 77X108 (DRAPES) ×1 IMPLANT
DRAPE U-SHAPE 76X120 STRL (DRAPES) IMPLANT
DRESSING VERAFLO CLEANSE CC LG (GAUZE/BANDAGES/DRESSINGS) IMPLANT
DRSG VAC GRANUFOAM LG (GAUZE/BANDAGES/DRESSINGS) ×2 IMPLANT
DRSG VAC GRANUFOAM MED (GAUZE/BANDAGES/DRESSINGS) IMPLANT
ELECTRODE REM PT RTRN 9FT ADLT (ELECTROSURGICAL) ×1 IMPLANT
GAUZE SPONGE 4X4 12PLY STRL (GAUZE/BANDAGES/DRESSINGS) ×1 IMPLANT
GAUZE XEROFORM 5X9 LF (GAUZE/BANDAGES/DRESSINGS) IMPLANT
GLOVE BIOGEL PI IND STRL 8 (GLOVE) ×1 IMPLANT
GOWN STRL REUS W/ TWL LRG LVL3 (GOWN DISPOSABLE) ×2 IMPLANT
GOWN STRL REUS W/TWL 2XL LVL3 (GOWN DISPOSABLE) ×2 IMPLANT
KIT BASIN OR (CUSTOM PROCEDURE TRAY) ×1 IMPLANT
KIT TURNOVER KIT B (KITS) ×1 IMPLANT
PACK CV ACCESS (CUSTOM PROCEDURE TRAY) IMPLANT
PACK GENERAL/GYN (CUSTOM PROCEDURE TRAY) ×1 IMPLANT
PACK UNIVERSAL I (CUSTOM PROCEDURE TRAY) ×1 IMPLANT
PAD ARMBOARD POSITIONER FOAM (MISCELLANEOUS) ×2 IMPLANT
PAD NEG PRESSURE SENSATRAC (MISCELLANEOUS) IMPLANT
POWDER MYRIAD MORCELLS 500MG (Miscellaneous) IMPLANT
SET HNDPC FAN SPRY TIP SCT (DISPOSABLE) IMPLANT
SOL .9 NS 3000ML IRR UROMATIC (IV SOLUTION) ×1 IMPLANT
SOLN 0.9% NACL POUR BTL 1000ML (IV SOLUTION) ×1 IMPLANT
SOLN STERILE WATER BTL 1000 ML (IV SOLUTION) ×1 IMPLANT
STAPLER SKIN PROX 35W (STAPLE) IMPLANT
SUT ETHILON 2 0 PSLX (SUTURE) IMPLANT
SUT ETHILON 3 0 PS 1 (SUTURE) IMPLANT
SUT VIC AB 2-0 CTX 36 (SUTURE) IMPLANT
SUT VIC AB 3-0 SH 27X BRD (SUTURE) IMPLANT
SUT VIC AB 4-0 PS2 18 (SUTURE) IMPLANT
TOWEL GREEN STERILE (TOWEL DISPOSABLE) ×1 IMPLANT

## 2024-04-12 NOTE — Anesthesia Postprocedure Evaluation (Signed)
 Anesthesia Post Note  Patient: Phillip [name]  Procedure(s) Performed: IRRIGATION AND DEBRIDEMENT WOUND (Left) APPLICATION, WOUND VAC (Left)     Patient location during evaluation: PACU Anesthesia Type: General Level of consciousness: awake and alert, patient cooperative and oriented Pain management: pain level controlled Vital Signs Assessment: post-procedure vital signs reviewed and stable Respiratory status: spontaneous breathing, nonlabored ventilation, respiratory function stable and patient connected to nasal cannula oxygen Cardiovascular status: blood pressure returned to baseline and stable Postop Assessment: no apparent nausea or vomiting Anesthetic complications: no   No notable events documented.  Last Vitals:  Vitals:   04/12/24 1715 04/12/24 1730  BP: 136/77 (!) 144/80  Pulse: 69 73  Resp: 13 11  Temp:    SpO2: 100% 100%    Last Pain:  Vitals:   04/12/24 1730  TempSrc:   PainSc: 0-No pain                 Vanesa Renier,E. Franceska Strahm

## 2024-04-12 NOTE — Anesthesia Procedure Notes (Signed)
 Procedure Name: Intubation Date/Time: 04/12/2024 3:48 PM  Performed by: Lockie Flesher, CRNAPre-anesthesia Checklist: Patient identified, Emergency Drugs available, Suction available and Patient being monitored Patient Re-evaluated:Patient Re-evaluated prior to induction Oxygen Delivery Method: Circle System Utilized Preoxygenation: Pre-oxygenation with 100% oxygen Induction Type: IV induction Ventilation: Mask ventilation without difficulty Laryngoscope Size: Mac and 3 Grade View: Grade I Tube type: Oral Tube size: 7.0 mm Number of attempts: 1 Airway Equipment and Method: Stylet and Oral airway Placement Confirmation: ETT inserted through vocal cords under direct vision, positive ETCO2 and breath sounds checked- equal and bilateral Secured at: 23 cm Tube secured with: Tape Dental Injury: Teeth and Oropharynx as per pre-operative assessment

## 2024-04-12 NOTE — H&P (Signed)
 POST OPERATIVE OFFICE NOTE  Patient seen and examined in preop holding.  No complaints. No changes to medication history or physical exam since last seen in clinic. After discussing the risks and benefits of left BKA revision, Phillip [name] elected to proceed.   Fonda FORBES Rim MD   CC:  F/u for surgery  HPI: Phillip [name] is a 68 y.o. male who is here as a triage visit.  He recently underwent left below-knee amputation on 01/30/2024 by Dr. Serene.  This was done for critical limb ischemia with tissue loss and a nonhealing left TMA.  He was seen at our office about 2 weeks ago with a small area of superficial skin separation on the anterolateral aspect of his staple line.  He returns today for repeat evaluation.  He says ever since the staples were removed from his amputation site, the area of incision dehiscence has gotten much larger.  Family states that they have noticed foul-smelling drainage on his bandages over the past couple of days.  He denies any worsening pain at his amputation site.  He denies any fevers or chills.  It has been a couple of weeks since he has finished his Augmentin  and doxycycline .   No Known Allergies  Current Facility-Administered Medications  Medication Dose Route Frequency Provider Last Rate Last Admin   0.9 %  sodium chloride  infusion   Intravenous Continuous Gretta Lonni PARAS, MD       0.9 %  sodium chloride  infusion   Intravenous Continuous Ellender, Bernardino SQUIBB, MD 10 mL/hr at 04/12/24 1347 New Bag at 04/12/24 1347   ceFAZolin  (ANCEF ) IVPB 2g/100 mL premix  2 g Intravenous 30 min Pre-Op  Gretta Lonni PARAS, MD       chlorhexidine  (HIBICLENS ) 4 % liquid 4 Application  60 mL Topical Once Gretta Lonni PARAS, MD       And   NOREEN ON 04/13/2024] chlorhexidine  (HIBICLENS ) 4 % liquid 4 Application  60 mL Topical Once Gretta Lonni PARAS, MD       insulin  aspart (novoLOG ) injection 0-7 Units  0-7 Units Subcutaneous Q2H PRN Ellender, Bernardino SQUIBB, MD          ROS:  See HPI  Physical Exam:  Incision: Worsened dehiscence of anterolateral aspect of the left BKA with slough and fibrinous exudate present in the wound bed.  There is a small area of tunneling within the medial aspect of the wound bed, which goes superiorly about 0.25 inches.  No expressible purulence Extremities: RLE warm and well-perfused Neuro: Alert and oriented x 3      Assessment/Plan:  This is a 68 y.o. male who presents as a triage visit  - The patient recently underwent left below-knee amputation by Dr. Serene on 8/29 for critical limb ischemia with tissue loss due to poorly healing TMA -The patient presents today for a triage visit.  At his last office visit all of his staples were removed from his incision.  There was one very small area of dehiscence at the anterolateral aspect of his incision.  Over the past 2 weeks this area has gotten much larger.  He also endorses new onset foul-smelling drainage from the area over the past couple of days.  He denies any fevers or chills -On exam his left BKA stump is healing appropriately medially.  There is a large area of dehiscence at the anterolateral aspect of the stump with fibrinous exudate and slough present.  I cannot express any drainage from the wound.  The  medial aspect of the wound bed appears to have a small area of tunneling, which goes superiorly for about 0.25 inches. - I have explained to the patient that his below-knee amputation is unlikely to heal at this state without returning to the OR.  He does not want to pursue conversion to above-knee amputation at this time.  I have explained to the patient that we may be able to help his BKA heal with wound debridement and likely wound VAC placement.  He is agreeable to pursue surgery. - We will schedule the patient for left below-knee amputation washout, debridement, and wound VAC placement on Monday with any available surgeon.  I will also start him on Keflex which he can take  over the weekend   Ahmed Holster, [place] [facility] [phone]   Clinic MD:  [place]

## 2024-04-12 NOTE — Transfer of Care (Signed)
 Immediate Anesthesia Transfer of Care Note  Patient: Phillip [name]  Procedure(s) Performed: IRRIGATION AND DEBRIDEMENT WOUND (Left) APPLICATION, WOUND VAC (Left)  Patient Location: PACU  Anesthesia Type:General  Level of Consciousness: awake, alert , and oriented  Airway & Oxygen Therapy: Patient Spontanous Breathing and Patient connected to nasal cannula oxygen  Post-op Assessment: Report given to RN and Post -op Vital signs reviewed and stable  Post vital signs: Reviewed and stable  Last Vitals:  Vitals Value Taken Time  BP 154/85 04/12/24 16:26  Temp    Pulse 79 04/12/24 16:28  Resp 24 04/12/24 16:28  SpO2 100 % 04/12/24 16:28  Vitals shown include unfiled device data.  Last Pain:  Vitals:   04/12/24 1323  TempSrc:   PainSc: 0-No pain         Complications: No notable events documented.

## 2024-04-12 NOTE — Op Note (Signed)
    NAME: [name]    MRN: [id number] DOB: Aug 15, 1955    DATE OF OPERATION: 04/12/2024  PREOP DIAGNOSIS:    Left below-knee amputation nonhealing wound  POSTOP DIAGNOSIS:    Same  PROCEDURE:    Left leg below-knee amputation incision, debridement 500 mg myriad morcell powder placement Vacuum-assisted closure-blue sponge 5 x 3 x 2 cm  SURGEON: Fonda FORBES Rim  ASSIST: Donnice Sender, PA  ANESTHESIA: General  EBL: 15 mL  INDICATIONS:    Phillip [name] is a 68 y.o. male with prior history of left leg below-knee amputation.  He has a nonhealing portion, that has worsened.  After discussing the risks and benefits of OR debridement, washout, count of elected to proceed  FINDINGS:   Wound bed 5 x 3 x 2 cm Necrotic tissue present, no purulence, no bone in the wound bed  TECHNIQUE:   Patient was brought to the OR laid in supine position.  Anesthesia was induced and patient prepped draped standard fashion.  The case began with examining the wound bed.  Skin and soft tissue were noted to be devitalized and were debrided using Metzenbaum scissors.  The bone was not in the wound bed.  There was healthy bleeding appreciated.  There was no purulence.  Overall wound size 5 x 3 x 2 cm.  The wound bed was irrigated with copious amounts of Vashe.  I elected to place a 500 mg of myriad morsel powder in the wound bed to promote granulation and wound healing.  A VAC sponge was cut to size, and placed to 125 mmHg suction.  The patient was taken the PACU in stable condition.    Fonda FORBES Rim, MD [facility] DATE OF DICTATION:   04/12/2024

## 2024-04-12 NOTE — H&P (Signed)
 History and Physical    Patient: Phillip [name] FMW:[id number] DOB: 18-Feb-1956 DOA: 04/12/2024 DOS: the patient was seen and examined on 04/12/2024 PCP: [name] LABOR., MD  Patient coming from: Home  Chief Complaint: No chief complaint on file.  HPI: Phillip [name] is a 68 y.o. male with medical history significant of end-stage renal disease on hemodialysis Tuesday Thursdays and Saturdays, diabetes mellitus type 2, recent acute cholecystitis in June 2025 requiring colostomy tube placement, chronic HFrEF with last EF of 30% in 2025, aortic stenosis status post TAVR 8/19/ 2025, PAD status post PCI to the left tibia-fibula on 12/26/2023 admitted by vascular surgery on 01/14/2024 for elective left third toe amputation due to gangrenous left third toe admitted with L BKA per vascular surgeon, Dr. Lanis on 11/10.  Pt resting comfortably in post-op and pain well controlled.   In PACU, pt AFVSS. Labs unremarkable.    Review of Systems: As mentioned in the history of present illness. All other systems reviewed and are negative. Past Medical History:  Diagnosis Date   Abdominopelvic abscess (HCC) 09/29/2018   Abscess of appendix 09/22/2018   Acquired spondylolisthesis of lumbosacral region 03/20/2020   Acute cholecystitis 11/15/2023   Anemia of chronic disease 08/18/2019   Anemia, chronic disease 08/18/2019   Aortic atherosclerosis    Aortic stenosis    a.) TTE 09/15/2018: mild-mod (MPG 19); b.) TTE 03/21/2021: mild-mod (MPG 14.3); c.) TTE 04/24/2022: mild- mod (MPG 15)   Appendicitis 09/08/2018   Asthma    Atherosclerosis of native arteries of the extremities with ulceration (HCC) 02/24/2018   Formatting of this note might be different from the original.  Last Assessment & Plan:   His ABIs today are 1.07 on the right and 0.99 on the left with biphasic waveforms.  Although these pressures may be somewhat elevated from medial calcification, his flow does appear to be reasonably good.  His  left ABI was 0.58 prior to intervention.  At this point, we will stretch out his follow-up and see hi   Benign prostatic hyperplasia without lower urinary tract symptoms 01/14/2018   Bilateral lower extremity edema 11/04/2018   Bradycardia 03/25/2018   CAD (coronary artery disease) 2003   a.) s/p 4v CABG 2003   Choledocholithiasis 11/12/2023   Chronic combined systolic and diastolic CHF (congestive heart failure) (HCC) 07/26/2015   a.) TTE 09/15/2018: EF 50-55%, mod LVH, RVE, BAE, mild-mod TR, AoV sclerosis, G1DD; b.) MPI 10/27/2019: EF <30%; c.) TTE 03/21/2021: EF 35-40%, post AK, inf HK, mod LVH, mod red RVSF, mod LAE, mod Aov sclerosis, G2DD; c.) TTE 04/24/2022: EF 35-40%, post AK, glok HK, mod LVE, mod red RVSF, mild-mod MR, Aov sclerosis, G3DD   Chronic idiopathic constipation 11/02/2018   Chronic low back pain without sciatica 12/02/2018   Chronic pain of right hip 03/31/2020   Coronary artery disease    Diabetes mellitus without complication (HCC)    Dysphagia 07/26/2015   Encounter for long-term (current) use of insulin  (HCC) 09/27/2020   Erectile dysfunction 07/14/2017   ESRD on dialysis (HCC) 11/15/2023   Essential hypertension 02/24/2018   Gangrene of toe of left foot (HCC) 01/14/2024   Hematuria 07/27/2015   History of marijuana use    Hyperlipidemia 07/26/2015   Hyperlipidemia associated with type 2 diabetes mellitus (HCC) 02/18/2020   Hypertension    Hypothyroidism (acquired) 11/14/2015   Insomnia 11/02/2018   Left below-knee amputee (HCC) 02/05/2024   Osseous and subluxation stenosis of intervertebral foramina of lumbar region 03/20/2020  Osteoarthritis 10/06/2019   Pancytopenia (HCC) 08/12/2023   Peripheral vascular disease    Personal history of tobacco use, presenting hazards to health 09/27/2020   Polyneuropathy in diabetes (HCC) 05/07/2019   Primary osteoarthritis involving multiple joints 10/06/2019   Primary osteoarthritis of right hip 02/21/2020    Protein-calorie malnutrition, severe 02/07/2024   Pulmonary HTN (HCC)    a.) TTE 05/12/2019: PASP 71; b.) TTE 09/27/2020: PASP >70; c.) TTE 03/21/2021: RVSP 43; d.) TTE 04/24/2022: RVSP 80.6   Puncture wound of right hip 04/02/2018   S/P CABG x 4 2003   S/P TAVR (transcatheter aortic valve replacement) 01/20/2024   TAVR with a 29 mm Edwards Sapien 3 Ultra Resilia THV via the TF by Dr. Wendel and Dr. Shyrl   Sleep apnea 07/26/2015   a.) does not require nocturnal PAP therapy   Subacute osteomyelitis of left foot (HCC) 03/25/2018   Thrombocytopenia    Type 2 diabetes mellitus with hyperlipidemia (HCC) 07/26/2015   Vitamin B12 deficiency 06/16/2019   Vitamin D  deficiency 05/07/2018   Past Surgical History:  Procedure Laterality Date   AMPUTATION Left 01/14/2024   Procedure: AMPUTATION, FOOT, PARTIAL;  Surgeon: Serene Gaile ORN, MD;  Location: [facility] OR;  Service: Vascular;  Laterality: Left;   AMPUTATION Left 01/30/2024   Procedure: AMPUTATION BELOW KNEE;  Surgeon: Serene Gaile ORN, MD;  Location: [facility] OR;  Service: Vascular;  Laterality: Left;   AMPUTATION TOE Left 03/13/2018   Procedure: AMPUTATION TOE-MPJ;  Surgeon: Ashley Soulier, DPM;  Location: [facility];  Service: Podiatry;  Laterality: Left;   ANGIOPLASTY     AV FISTULA PLACEMENT Left 08/20/2023   Procedure: ARTERIOVENOUS (AV) FISTULA CREATION;  Surgeon: Gretta Lonni PARAS, MD;  Location: [facility] OR;  Service: Vascular;  Laterality: Left;   CARDIAC CATHETERIZATION     CORONARY ARTERY BYPASS GRAFT N/A 2003   INTRAOPERATIVE TRANSTHORACIC ECHOCARDIOGRAM N/A 01/20/2024   Procedure: ECHOCARDIOGRAM, TRANSTHORACIC;  Surgeon: Wendel Lurena POUR, MD;  Location: [facility];  Service: Cardiovascular;  Laterality: N/A;   IR CATHETER TUBE CHANGE  12/31/2023   IR DIALY SHUNT INTRO NEEDLE/INTRACATH INITIAL W/IMG LEFT Left 02/09/2024   IR EXCHANGE BILIARY DRAIN  02/03/2024   IR EXCHANGE BILIARY DRAIN  02/17/2024   IR EXCHANGE BILIARY DRAIN  03/05/2024    IR FLUORO GUIDE CV LINE RIGHT  08/18/2023   IR PERC CHOLECYSTOSTOMY  11/12/2023   IR RADIOLOGIST EVAL & MGMT  12/31/2023   IR TUNNELED CENTRAL VENOUS CATH PLC W IMG  02/23/2024   IR US  GUIDE VASC ACCESS RIGHT  08/18/2023   LAPAROSCOPIC APPENDECTOMY N/A 09/08/2018   Procedure: APPENDECTOMY LAPAROSCOPIC;  Surgeon: Jordis Laneta FALCON, MD;  Location: [facility];  Service: General;  Laterality: N/A;   LOWER EXTREMITY ANGIOGRAPHY Left 03/02/2018   Procedure: LOWER EXTREMITY ANGIOGRAPHY;  Surgeon: Marea Selinda RAMAN, MD;  Location: [facility];  Service: Cardiovascular;  Laterality: Left;   LOWER EXTREMITY ANGIOGRAPHY Left 09/30/2022   Procedure: Lower Extremity Angiography;  Surgeon: Marea Selinda RAMAN, MD;  Location: [facility];  Service: Cardiovascular;  Laterality: Left;   LOWER EXTREMITY ANGIOGRAPHY Left 12/26/2023   Procedure: Lower Extremity Angiography;  Surgeon: Marea Selinda RAMAN, MD;  Location: [facility];  Service: Cardiovascular;  Laterality: Left;   RIGHT/LEFT HEART CATH AND CORONARY/GRAFT ANGIOGRAPHY N/A 08/01/2023   Procedure: RIGHT/LEFT HEART CATH AND CORONARY/GRAFT ANGIOGRAPHY;  Surgeon: Wendel Lurena POUR, MD;  Location: [facility];  Service: Cardiovascular;  Laterality: N/A;   ROTATOR CUFF  REPAIR Left    TRANSMETATARSAL AMPUTATION Left 01/16/2024   Procedure: AMPUTATION, FOOT, TRANSMETATARSAL;  Surgeon: Serene Gaile ORN, MD;  Location: [facility] OR;  Service: Vascular;  Laterality: Left;   Social History:  reports that he quit smoking about 22 years ago. His smoking use included cigarettes. He started smoking about 47 years ago. He has a 12.5 pack-year smoking history. He has never used smokeless tobacco. He reports that he does not currently use alcohol. He reports that he does not use drugs.  No Known Allergies  Family History  Problem Relation Age of Onset   Hyperlipidemia Mother    Hypertension Mother    Diabetes Mother    Heart attack Brother    Heart disease Brother     Lung disease Father     Prior to Admission medications   Medication Sig Start Date End Date Taking? Authorizing Provider  acetaminophen  (TYLENOL ) 325 MG tablet Take 2 tablets (650 mg total) by mouth every 6 (six) hours as needed for mild pain (pain score 1-3) or fever (or Fever >/= 101). 02/19/24  Yes Angiulli, Toribio PARAS, PA-C  aspirin  EC 81 MG tablet Take 1 tablet (81 mg total) by mouth daily. Swallow whole. 07/25/23  Yes Thukkani, Arun K, MD  atorvastatin  (LIPITOR) 20 MG tablet Take 1 tablet (20 mg total) by mouth daily. 02/19/24  Yes Angiulli, Toribio PARAS, PA-C  B Complex-C (B-COMPLEX WITH VITAMIN C) tablet Take 1 tablet by mouth daily. 02/19/24  Yes Angiulli, Toribio PARAS, PA-C  carvedilol  (COREG ) 6.25 MG tablet Take 6.125 mg (1 tablet) in the morning and take 3.125 mg (0.5 tablet) in the evening. 03/03/24  Yes Revankar, Jennifer SAUNDERS, MD  gabapentin  (NEURONTIN ) 100 MG capsule Take 2 capsules (200 mg total) by mouth at bedtime. Patient taking differently: Take 600 mg by mouth 2 (two) times daily. 02/19/24  Yes Angiulli, Toribio PARAS, PA-C  levothyroxine  (SYNTHROID ) 50 MCG tablet Take 1 tablet (50 mcg total) by mouth daily. 02/19/24  Yes Angiulli, Toribio PARAS, PA-C  nitroGLYCERIN  (NITROSTAT ) 0.4 MG SL tablet Place 1 tablet (0.4 mg total) under the tongue every 5 (five) minutes as needed for chest pain. 02/19/24  Yes Angiulli, Toribio PARAS, PA-C  oxyCODONE  (OXY IR/ROXICODONE ) 5 MG immediate release tablet Take 1 tablet (5 mg total) by mouth every 6 (six) hours as needed for severe pain (pain score 7-10). 03/22/24  Yes Baglia, Corrina, PA-C  sevelamer  carbonate (RENVELA ) 800 MG tablet Take 1 tablet (800 mg total) by mouth 3 (three) times daily with meals. 02/19/24 05/16/25 Yes Raulkar, Sven SQUIBB, MD  vitamin D3 (CHOLECALCIFEROL ) 25 MCG tablet Take 1 tablet (1,000 Units total) by mouth daily. 02/19/24  Yes Angiulli, Toribio PARAS, PA-C  cephALEXin (KEFLEX) 500 MG capsule Take 1 capsule (500 mg total) by mouth 3 (three) times  daily. Patient not taking: Reported on 04/09/2024 04/08/24   Elna Loud P, PA-C  clopidogrel  (PLAVIX ) 75 MG tablet Take 1 tablet (75 mg total) by mouth daily. Patient not taking: Reported on 04/09/2024 02/19/24   Angiulli, Toribio PARAS, PA-C  docusate sodium  (COLACE) 100 MG capsule Take 1 capsule (100 mg total) by mouth daily. Patient not taking: Reported on 04/09/2024 02/19/24   Angiulli, Toribio PARAS, PA-C  latanoprost  (XALATAN ) 0.005 % ophthalmic solution Place 1 drop into both eyes at bedtime. Patient not taking: Reported on 04/09/2024 02/19/24   Pegge Toribio PARAS, PA-C    Physical Exam: Vitals:   04/12/24 1800 04/12/24 1815 04/12/24 1830 04/12/24 1845  BP: (!) 142/87 ROLLEN)  142/99 (!) 150/82 (!) 146/83  Pulse: 77 77 76 77  Resp: 16 15 10 13   Temp:    97.6 F (36.4 C)  TempSrc:      SpO2: 100% 100% 100% 100%  Weight:      Height:       General: Alert, oriented x3, resting comfortably in no acute distress HEENT: EOMI, oropharynx clear, moist mucous membranes, hearing intact Neck: Trachea midline and no gross thyromegaly Respiratory: Lungs clear to auscultation bilaterally with normal respiratory effort; no w/r/r Cardiovascular: Regular rate and rhythm w/o m/r/g Abdomen: Soft, nontender, nondistended. Positive bowel sounds MSK: No obvious joint deformities or swelling Skin: No obvious rashes or lesions Neurologic: Awake, alert, spontaneously moves all extremities, strength intact Psychiatric: Appropriate mood and affect, conversational and cooperative   Data Reviewed: {Tip this will not be part of the note when signed- Document your independent interpretation of telemetry tracing, EKG, lab, Radiology test or any other diagnostic tests. Add any new diagnostic test ordered today. (Optional):[id number]} Lab Results  Component Value Date   WBC 5.5 02/23/2024   HGB 10.5 (L) 04/12/2024   HCT 31.0 (L) 04/12/2024   MCV 97.2 02/23/2024   PLT 142 (L) 02/23/2024   Lab Results  Component Value Date    GLUCOSE 94 04/12/2024   CALCIUM  8.4 (L) 02/23/2024   NA 140 04/12/2024   K 3.9 04/12/2024   CO2 24 02/23/2024   CL 100 04/12/2024   BUN 47 (H) 04/12/2024   CREATININE 4.90 (H) 04/12/2024   Lab Results  Component Value Date   ALT <5 01/26/2024   AST 17 01/26/2024   ALKPHOS 96 01/26/2024   BILITOT 0.8 01/26/2024   Lab Results  Component Value Date   INR 1.1 02/09/2024   INR 1.1 01/19/2024   INR 1.3 (H) 11/12/2023   Radiology: No results found.   Assessment and Plan: 77M h/o end-stage renal disease on hemodialysis Tuesday Thursdays and Saturdays, diabetes mellitus type 2, recent acute cholecystitis in June 2025 requiring colostomy tube placement, chronic HFrEF with last EF of 30% in 2025, aortic stenosis status post TAVR 8/19/ 2025, PAD status post PCI to the left tibia-fibula on 12/26/2023 admitted by vascular surgery on 01/14/2024 for elective left third toe amputation due to gangrenous left third toe admitted with L BKA per vascular surgeon, Dr. Lanis on 11/10.   S/p L BKA -Defer to vascular surgery -PO tylenol  650mg  q4h prn and oxycodone  5mg  q4h prn   HFrEF -PTA Coreg  6.25mg  BID -Minimize IVF -F/u BNP and consider diuresis if elevated   ESRD -Renal consulted for HD; apprec eval/recs -PTA Renvela    Hypothyroid -PTA Synthroid  50mcg daily   Advance Care Planning:   Code Status: Full Code   Consults: Vascular surgery  Family Communication: N/A  Severity of Illness: The appropriate patient status for this patient is INPATIENT. Inpatient status is judged to be reasonable and necessary in order to provide the required intensity of service to ensure the patient's safety. The patient's presenting symptoms, physical exam findings, and initial radiographic and laboratory data in the context of their chronic comorbidities is felt to place them at high risk for further clinical deterioration. Furthermore, it is not anticipated that the patient will be medically stable for  discharge from the hospital within 2 midnights of admission.   * I certify that at the point of admission it is my clinical judgment that the patient will require inpatient hospital care spanning beyond 2 midnights from the point of admission due  to high intensity of service, high risk for further deterioration and high frequency of surveillance required.*   ------- I spent 55 minutes reviewing previous notes, at the bedside counseling/discussing the treatment plan, and performing clinical documentation.  Author: Marsha Ada, MD 04/12/2024 7:03 PM  For on call review [email or web address].

## 2024-04-12 NOTE — Consult Note (Incomplete)
 Initial Consultation Note   Patient: Phillip [name] FMW:[id number] DOB: April 06, 1956 PCP: [name] LABOR., MD DOA: 04/12/2024 DOS: the patient was seen and examined on 04/12/2024 Primary service: Lanis Fonda BRAVO, MD  Referring physician: Donnice Sender, PA-C Reason for consult: Complex PMH with ESRD, CHF, diabetes   Assessment/Plan: Assessment and Plan: 89M h/o end-stage renal disease on hemodialysis Tuesday Thursdays and Saturdays, diabetes mellitus type 2, recent acute cholecystitis in June 2025 requiring colostomy tube placement, chronic HFrEF with last EF of 30% in 2025, aortic stenosis status post TAVR 8/19/ 2025, PAD status post PCI to the left tibia-fibula on 12/26/2023 admitted by vascular surgery on 01/14/2024 for elective left third toe amputation due to gangrenous left third toe admitted with L BKA per vascular surgeon, Dr. Lanis on 11/10.  S/p L BKA -Defer to vascular surgery -PO tylenol  650mg  q4h prn and oxycodone  5mg  q4h prn  HFrEF -PTA Coreg  6.25mg  BID -Minimize IVF -F/u BNP and consider diuresis if elevated  ESRD -Renal consulted for HD; apprec eval/recs -PTA Renvela   Hypothyroid -PTA Synthroid  50mcg daily   TRH will continue to follow the patient.  HPI: Phillip [name] is a 68 y.o. male with past medical history of end-stage renal disease on hemodialysis Tuesday Thursdays and Saturdays, diabetes mellitus type 2, recent acute cholecystitis in June 2025 requiring colostomy tube placement, chronic HFrEF with last EF of 30% in 2025, aortic stenosis status post TAVR 8/19/ 2025, PAD status post PCI to the left tibia-fibula on 12/26/2023 admitted by vascular surgery on 01/14/2024 for elective left third toe amputation due to gangrenous left third toe admitted with L BKA per vascular surgeon, Dr. Lanis on 11/10.SABRA  Review of Systems: As mentioned in the history of present illness. All other systems reviewed and are negative. Past Medical History:  Diagnosis Date    Abdominopelvic abscess (HCC) 09/29/2018   Abscess of appendix 09/22/2018   Acquired spondylolisthesis of lumbosacral region 03/20/2020   Acute cholecystitis 11/15/2023   Anemia of chronic disease 08/18/2019   Anemia, chronic disease 08/18/2019   Aortic atherosclerosis    Aortic stenosis    a.) TTE 09/15/2018: mild-mod (MPG 19); b.) TTE 03/21/2021: mild-mod (MPG 14.3); c.) TTE 04/24/2022: mild- mod (MPG 15)   Appendicitis 09/08/2018   Asthma    Atherosclerosis of native arteries of the extremities with ulceration (HCC) 02/24/2018   Formatting of this note might be different from the original.  Last Assessment & Plan:   His ABIs today are 1.07 on the right and 0.99 on the left with biphasic waveforms.  Although these pressures may be somewhat elevated from medial calcification, his flow does appear to be reasonably good.  His left ABI was 0.58 prior to intervention.  At this point, we will stretch out his follow-up and see hi   Benign prostatic hyperplasia without lower urinary tract symptoms 01/14/2018   Bilateral lower extremity edema 11/04/2018   Bradycardia 03/25/2018   CAD (coronary artery disease) 2003   a.) s/p 4v CABG 2003   Choledocholithiasis 11/12/2023   Chronic combined systolic and diastolic CHF (congestive heart failure) (HCC) 07/26/2015   a.) TTE 09/15/2018: EF 50-55%, mod LVH, RVE, BAE, mild-mod TR, AoV sclerosis, G1DD; b.) MPI 10/27/2019: EF <30%; c.) TTE 03/21/2021: EF 35-40%, post AK, inf HK, mod LVH, mod red RVSF, mod LAE, mod Aov sclerosis, G2DD; c.) TTE 04/24/2022: EF 35-40%, post AK, glok HK, mod LVE, mod red RVSF, mild-mod MR, Aov sclerosis, G3DD   Chronic idiopathic constipation 11/02/2018   Chronic  low back pain without sciatica 12/02/2018   Chronic pain of right hip 03/31/2020   Coronary artery disease    Diabetes mellitus without complication (HCC)    Dysphagia 07/26/2015   Encounter for long-term (current) use of insulin  (HCC) 09/27/2020   Erectile dysfunction  07/14/2017   ESRD on dialysis (HCC) 11/15/2023   Essential hypertension 02/24/2018   Gangrene of toe of left foot (HCC) 01/14/2024   Hematuria 07/27/2015   History of marijuana use    Hyperlipidemia 07/26/2015   Hyperlipidemia associated with type 2 diabetes mellitus (HCC) 02/18/2020   Hypertension    Hypothyroidism (acquired) 11/14/2015   Insomnia 11/02/2018   Left below-knee amputee (HCC) 02/05/2024   Osseous and subluxation stenosis of intervertebral foramina of lumbar region 03/20/2020   Osteoarthritis 10/06/2019   Pancytopenia (HCC) 08/12/2023   Peripheral vascular disease    Personal history of tobacco use, presenting hazards to health 09/27/2020   Polyneuropathy in diabetes (HCC) 05/07/2019   Primary osteoarthritis involving multiple joints 10/06/2019   Primary osteoarthritis of right hip 02/21/2020   Protein-calorie malnutrition, severe 02/07/2024   Pulmonary HTN (HCC)    a.) TTE 05/12/2019: PASP 71; b.) TTE 09/27/2020: PASP >70; c.) TTE 03/21/2021: RVSP 43; d.) TTE 04/24/2022: RVSP 80.6   Puncture wound of right hip 04/02/2018   S/P CABG x 4 2003   S/P TAVR (transcatheter aortic valve replacement) 01/20/2024   TAVR with a 29 mm Edwards Sapien 3 Ultra Resilia THV via the TF by Dr. Wendel and Dr. Shyrl   Sleep apnea 07/26/2015   a.) does not require nocturnal PAP therapy   Subacute osteomyelitis of left foot (HCC) 03/25/2018   Thrombocytopenia    Type 2 diabetes mellitus with hyperlipidemia (HCC) 07/26/2015   Vitamin B12 deficiency 06/16/2019   Vitamin D  deficiency 05/07/2018   Past Surgical History:  Procedure Laterality Date   AMPUTATION Left 01/14/2024   Procedure: AMPUTATION, FOOT, PARTIAL;  Surgeon: Serene Gaile ORN, MD;  Location: [facility] OR;  Service: Vascular;  Laterality: Left;   AMPUTATION Left 01/30/2024   Procedure: AMPUTATION BELOW KNEE;  Surgeon: Serene Gaile ORN, MD;  Location: [facility] OR;  Service: Vascular;  Laterality: Left;   AMPUTATION TOE Left 03/13/2018    Procedure: AMPUTATION TOE-MPJ;  Surgeon: Ashley Soulier, DPM;  Location: [facility] ORS;  Service: Podiatry;  Laterality: Left;   ANGIOPLASTY     AV FISTULA PLACEMENT Left 08/20/2023   Procedure: ARTERIOVENOUS (AV) FISTULA CREATION;  Surgeon: Gretta Lonni PARAS, MD;  Location: [facility] OR;  Service: Vascular;  Laterality: Left;   CARDIAC CATHETERIZATION     CORONARY ARTERY BYPASS GRAFT N/A 2003   INTRAOPERATIVE TRANSTHORACIC ECHOCARDIOGRAM N/A 01/20/2024   Procedure: ECHOCARDIOGRAM, TRANSTHORACIC;  Surgeon: Wendel Lurena POUR, MD;  Location: [facility];  Service: Cardiovascular;  Laterality: N/A;   IR CATHETER TUBE CHANGE  12/31/2023   IR DIALY SHUNT INTRO NEEDLE/INTRACATH INITIAL W/IMG LEFT Left 02/09/2024   IR EXCHANGE BILIARY DRAIN  02/03/2024   IR EXCHANGE BILIARY DRAIN  02/17/2024   IR EXCHANGE BILIARY DRAIN  03/05/2024   IR FLUORO GUIDE CV LINE RIGHT  08/18/2023   IR PERC CHOLECYSTOSTOMY  11/12/2023   IR RADIOLOGIST EVAL & MGMT  12/31/2023   IR TUNNELED CENTRAL VENOUS CATH PLC W IMG  02/23/2024   IR US  GUIDE VASC ACCESS RIGHT  08/18/2023   LAPAROSCOPIC APPENDECTOMY N/A 09/08/2018   Procedure: APPENDECTOMY LAPAROSCOPIC;  Surgeon: Jordis Laneta FALCON, MD;  Location: [facility] ORS;  Service: General;  Laterality: N/A;   LOWER  EXTREMITY ANGIOGRAPHY Left 03/02/2018   Procedure: LOWER EXTREMITY ANGIOGRAPHY;  Surgeon: Marea Selinda RAMAN, MD;  Location: [facility];  Service: Cardiovascular;  Laterality: Left;   LOWER EXTREMITY ANGIOGRAPHY Left 09/30/2022   Procedure: Lower Extremity Angiography;  Surgeon: Marea Selinda RAMAN, MD;  Location: [facility];  Service: Cardiovascular;  Laterality: Left;   LOWER EXTREMITY ANGIOGRAPHY Left 12/26/2023   Procedure: Lower Extremity Angiography;  Surgeon: Marea Selinda RAMAN, MD;  Location: [facility];  Service: Cardiovascular;  Laterality: Left;   RIGHT/LEFT HEART CATH AND CORONARY/GRAFT ANGIOGRAPHY N/A 08/01/2023   Procedure: RIGHT/LEFT HEART CATH AND  CORONARY/GRAFT ANGIOGRAPHY;  Surgeon: Wendel Lurena POUR, MD;  Location: [facility];  Service: Cardiovascular;  Laterality: N/A;   ROTATOR CUFF REPAIR Left    TRANSMETATARSAL AMPUTATION Left 01/16/2024   Procedure: AMPUTATION, FOOT, TRANSMETATARSAL;  Surgeon: Serene Gaile ORN, MD;  Location: [facility] OR;  Service: Vascular;  Laterality: Left;   Social History:  reports that he quit smoking about 22 years ago. His smoking use included cigarettes. He started smoking about 47 years ago. He has a 12.5 pack-year smoking history. He has never used smokeless tobacco. He reports that he does not currently use alcohol. He reports that he does not use drugs.  No Known Allergies  Family History  Problem Relation Age of Onset   Hyperlipidemia Mother    Hypertension Mother    Diabetes Mother    Heart attack Brother    Heart disease Brother    Lung disease Father     Prior to Admission medications   Medication Sig Start Date End Date Taking? Authorizing Provider  acetaminophen  (TYLENOL ) 325 MG tablet Take 2 tablets (650 mg total) by mouth every 6 (six) hours as needed for mild pain (pain score 1-3) or fever (or Fever >/= 101). 02/19/24  Yes Angiulli, Toribio PARAS, PA-C  aspirin  EC 81 MG tablet Take 1 tablet (81 mg total) by mouth daily. Swallow whole. 07/25/23  Yes Thukkani, Arun K, MD  atorvastatin  (LIPITOR) 20 MG tablet Take 1 tablet (20 mg total) by mouth daily. 02/19/24  Yes Angiulli, Toribio PARAS, PA-C  B Complex-C (B-COMPLEX WITH VITAMIN C) tablet Take 1 tablet by mouth daily. 02/19/24  Yes Angiulli, Toribio PARAS, PA-C  carvedilol  (COREG ) 6.25 MG tablet Take 6.125 mg (1 tablet) in the morning and take 3.125 mg (0.5 tablet) in the evening. 03/03/24  Yes Revankar, Jennifer SAUNDERS, MD  gabapentin  (NEURONTIN ) 100 MG capsule Take 2 capsules (200 mg total) by mouth at bedtime. Patient taking differently: Take 600 mg by mouth 2 (two) times daily. 02/19/24  Yes Angiulli, Toribio PARAS, PA-C  levothyroxine  (SYNTHROID ) 50 MCG tablet Take  1 tablet (50 mcg total) by mouth daily. 02/19/24  Yes Angiulli, Toribio PARAS, PA-C  nitroGLYCERIN  (NITROSTAT ) 0.4 MG SL tablet Place 1 tablet (0.4 mg total) under the tongue every 5 (five) minutes as needed for chest pain. 02/19/24  Yes Angiulli, Toribio PARAS, PA-C  oxyCODONE  (OXY IR/ROXICODONE ) 5 MG immediate release tablet Take 1 tablet (5 mg total) by mouth every 6 (six) hours as needed for severe pain (pain score 7-10). 03/22/24  Yes Baglia, Corrina, PA-C  sevelamer  carbonate (RENVELA ) 800 MG tablet Take 1 tablet (800 mg total) by mouth 3 (three) times daily with meals. 02/19/24 05/16/25 Yes Raulkar, Sven SQUIBB, MD  vitamin D3 (CHOLECALCIFEROL ) 25 MCG tablet Take 1 tablet (1,000 Units total) by mouth daily. 02/19/24  Yes Angiulli, Toribio PARAS, PA-C  cephALEXin (KEFLEX) 500 MG  capsule Take 1 capsule (500 mg total) by mouth 3 (three) times daily. Patient not taking: Reported on 04/09/2024 04/08/24   Elna Loud P, PA-C  clopidogrel  (PLAVIX ) 75 MG tablet Take 1 tablet (75 mg total) by mouth daily. Patient not taking: Reported on 04/09/2024 02/19/24   Angiulli, Toribio PARAS, PA-C  docusate sodium  (COLACE) 100 MG capsule Take 1 capsule (100 mg total) by mouth daily. Patient not taking: Reported on 04/09/2024 02/19/24   Angiulli, Toribio PARAS, PA-C  latanoprost  (XALATAN ) 0.005 % ophthalmic solution Place 1 drop into both eyes at bedtime. Patient not taking: Reported on 04/09/2024 02/19/24   Pegge Toribio PARAS, PA-C    Physical Exam: Vitals:   04/12/24 1700 04/12/24 1715 04/12/24 1730 04/12/24 1745  BP: 136/72 136/77 (!) 144/80 (!) 148/81  Pulse: 79 69 73 81  Resp: 12 13 11 12   Temp:      TempSrc:      SpO2: 100% 100% 100% 100%  Weight:      Height:       General: Alert, oriented x3, resting comfortably in no acute distress Respiratory: Lungs clear to auscultation bilaterally with normal respiratory effort; no w/r/r Cardiovascular: Regular rate and rhythm w/o m/r/g Abdomen: Soft, nontender, nondistended. Positive  bowel sounds   Data Reviewed:  {Tip this will not be part of the note when signed- Document your independent interpretation of telemetry tracing, EKG, lab, Radiology test or any other diagnostic tests. Add any new diagnostic test ordered today. (Optional):26781} Lab Results  Component Value Date   WBC 5.5 02/23/2024   HGB 10.5 (L) 04/12/2024   HCT 31.0 (L) 04/12/2024   MCV 97.2 02/23/2024   PLT 142 (L) 02/23/2024   Lab Results  Component Value Date   GLUCOSE 94 04/12/2024   CALCIUM  8.4 (L) 02/23/2024   NA 140 04/12/2024   K 3.9 04/12/2024   CO2 24 02/23/2024   CL 100 04/12/2024   BUN 47 (H) 04/12/2024   CREATININE 4.90 (H) 04/12/2024   Lab Results  Component Value Date   ALT <5 01/26/2024   AST 17 01/26/2024   ALKPHOS 96 01/26/2024   BILITOT 0.8 01/26/2024   Lab Results  Component Value Date   INR 1.1 02/09/2024   INR 1.1 01/19/2024   INR 1.3 (H) 11/12/2023   Radiology: No results found.  {Tip this will not be part of the note when signed  DVT Prophylaxis  .,  (Optional):26781}  Family Communication: N/A Primary team communication: N/A   Thank you very much for involving us  in the care of your patient.  Author: Marsha Ada, MD 04/12/2024 6:31 PM  For on call review [email or web address].

## 2024-04-13 ENCOUNTER — Encounter (HOSPITAL_COMMUNITY): Payer: Self-pay | Admitting: Vascular Surgery

## 2024-04-13 DIAGNOSIS — Z992 Dependence on renal dialysis: Secondary | ICD-10-CM

## 2024-04-13 DIAGNOSIS — Z862 Personal history of diseases of the blood and blood-forming organs and certain disorders involving the immune mechanism: Secondary | ICD-10-CM

## 2024-04-13 DIAGNOSIS — Z4889 Encounter for other specified surgical aftercare: Secondary | ICD-10-CM

## 2024-04-13 DIAGNOSIS — N186 End stage renal disease: Secondary | ICD-10-CM

## 2024-04-13 DIAGNOSIS — Z89512 Acquired absence of left leg below knee: Secondary | ICD-10-CM

## 2024-04-13 DIAGNOSIS — N189 Chronic kidney disease, unspecified: Secondary | ICD-10-CM | POA: Diagnosis not present

## 2024-04-13 LAB — CBC WITH DIFFERENTIAL/PLATELET
Abs Immature Granulocytes: 0.03 K/uL (ref 0.00–0.07)
Basophils Absolute: 0 K/uL (ref 0.0–0.1)
Basophils Relative: 0 %
Eosinophils Absolute: 0 K/uL (ref 0.0–0.5)
Eosinophils Relative: 0 %
HCT: 31 % — ABNORMAL LOW (ref 39.0–52.0)
Hemoglobin: 9.8 g/dL — ABNORMAL LOW (ref 13.0–17.0)
Immature Granulocytes: 0 %
Lymphocytes Relative: 14 %
Lymphs Abs: 1.1 K/uL (ref 0.7–4.0)
MCH: 30.6 pg (ref 26.0–34.0)
MCHC: 31.6 g/dL (ref 30.0–36.0)
MCV: 96.9 fL (ref 80.0–100.0)
Monocytes Absolute: 0.4 K/uL (ref 0.1–1.0)
Monocytes Relative: 5 %
Neutro Abs: 6.4 K/uL (ref 1.7–7.7)
Neutrophils Relative %: 81 %
Platelets: 134 K/uL — ABNORMAL LOW (ref 150–400)
RBC: 3.2 MIL/uL — ABNORMAL LOW (ref 4.22–5.81)
RDW: 14.6 % (ref 11.5–15.5)
WBC: 8 K/uL (ref 4.0–10.5)
nRBC: 0 % (ref 0.0–0.2)

## 2024-04-13 LAB — HEPATIC FUNCTION PANEL
ALT: 12 U/L (ref 0–44)
AST: 31 U/L (ref 15–41)
Albumin: 2.4 g/dL — ABNORMAL LOW (ref 3.5–5.0)
Alkaline Phosphatase: 211 U/L — ABNORMAL HIGH (ref 38–126)
Bilirubin, Direct: 0.3 mg/dL — ABNORMAL HIGH (ref 0.0–0.2)
Indirect Bilirubin: 0.4 mg/dL (ref 0.3–0.9)
Total Bilirubin: 0.7 mg/dL (ref 0.0–1.2)
Total Protein: 7.5 g/dL (ref 6.5–8.1)

## 2024-04-13 LAB — BASIC METABOLIC PANEL WITH GFR
Anion gap: 17 — ABNORMAL HIGH (ref 5–15)
BUN: 49 mg/dL — ABNORMAL HIGH (ref 8–23)
CO2: 23 mmol/L (ref 22–32)
Calcium: 8.3 mg/dL — ABNORMAL LOW (ref 8.9–10.3)
Chloride: 99 mmol/L (ref 98–111)
Creatinine, Ser: 5.62 mg/dL — ABNORMAL HIGH (ref 0.61–1.24)
GFR, Estimated: 10 mL/min — ABNORMAL LOW (ref >60)
Glucose, Bld: 158 mg/dL — ABNORMAL HIGH (ref 70–99)
Potassium: 4.2 mmol/L (ref 3.5–5.1)
Sodium: 139 mmol/L (ref 135–145)

## 2024-04-13 LAB — HEPATITIS B SURFACE ANTIGEN: Hepatitis B Surface Ag: NONREACTIVE

## 2024-04-13 LAB — PHOSPHORUS: Phosphorus: 6.9 mg/dL — ABNORMAL HIGH (ref 2.5–4.6)

## 2024-04-13 LAB — GLUCOSE, CAPILLARY
Glucose-Capillary: 142 mg/dL — ABNORMAL HIGH (ref 70–99)
Glucose-Capillary: 157 mg/dL — ABNORMAL HIGH (ref 70–99)

## 2024-04-13 LAB — MAGNESIUM: Magnesium: 2 mg/dL (ref 1.7–2.4)

## 2024-04-13 MED ORDER — ANTICOAGULANT SODIUM CITRATE 4% (200MG/5ML) IV SOLN: 5.0000 mL | Status: DC | PRN

## 2024-04-13 MED ORDER — HEPARIN SODIUM (PORCINE) 1000 UNIT/ML DIALYSIS: 1000.0000 [IU] | INTRAMUSCULAR | Status: DC | PRN

## 2024-04-13 MED ORDER — HEPARIN SODIUM (PORCINE) 1000 UNIT/ML DIALYSIS
1000.0000 [IU] | INTRAMUSCULAR | Status: DC | PRN
Start: 2024-04-13 — End: 2024-04-13

## 2024-04-13 MED ORDER — PENTAFLUOROPROP-TETRAFLUOROETH EX AERO: 1.0000 | INHALATION_SPRAY | CUTANEOUS | Status: DC | PRN

## 2024-04-13 MED ORDER — NEPRO/CARBSTEADY PO LIQD: 237.0000 mL | ORAL | Status: DC | PRN

## 2024-04-13 MED ORDER — ALTEPLASE 2 MG IJ SOLR: 2.0000 mg | Freq: Once | INTRAMUSCULAR | Status: DC | PRN

## 2024-04-13 MED ORDER — PENTAFLUOROPROP-TETRAFLUOROETH EX AERO
1.0000 | INHALATION_SPRAY | CUTANEOUS | Status: DC | PRN
Start: 2024-04-13 — End: 2024-04-13

## 2024-04-13 MED ORDER — LIDOCAINE-PRILOCAINE 2.5-2.5 % EX CREA: 1.0000 | TOPICAL_CREAM | CUTANEOUS | Status: DC | PRN

## 2024-04-13 MED ORDER — LIDOCAINE HCL (PF) 1 % IJ SOLN: 5.0000 mL | INTRAMUSCULAR | Status: DC | PRN

## 2024-04-13 MED ORDER — INSULIN ASPART 100 UNIT/ML IJ SOLN: 0.0000 [IU] | Freq: Every day | INTRAMUSCULAR | Status: DC

## 2024-04-13 MED ORDER — ENOXAPARIN SODIUM 30 MG/0.3ML IJ SOSY
30.0000 mg | PREFILLED_SYRINGE | INTRAMUSCULAR | Status: DC
  Administered 2024-04-14 – 2024-04-15 (×2): 30 mg via SUBCUTANEOUS
  Filled 2024-04-13 (×2): qty 0.3

## 2024-04-13 MED ORDER — INSULIN ASPART 100 UNIT/ML IJ SOLN
0.0000 [IU] | Freq: Three times a day (TID) | INTRAMUSCULAR | Status: DC
  Administered 2024-04-14: 1 [IU] via SUBCUTANEOUS
  Filled 2024-04-13: qty 1

## 2024-04-13 MED ORDER — CHLORHEXIDINE GLUCONATE CLOTH 2 % EX PADS
6.0000 | MEDICATED_PAD | Freq: Every day | CUTANEOUS | Status: DC
  Administered 2024-04-13 – 2024-04-15 (×3): 6 via TOPICAL

## 2024-04-13 NOTE — Progress Notes (Signed)
 PROGRESS NOTE    Phillip [name]  FMW:[id number] DOB: 11-17-1955 DOA: 04/12/2024 PCP: [name] LABOR., MD   Brief Narrative:  Phillip [name] is a 68 y.o. male with medical history significant of ESRD on HD TThSat, DM2, recent acute cholecystitis in June 2025 requiring colostomy tube placement, chronic HFrEF with last EF of 30% in 2025, aortic stenosis status post TAVR 8/19/ 2025, PAD status post PCI to the left tibia-fibula on 12/26/2023 admitted by vascular surgery on 01/14/2024 for elective left third toe amputation due to gangrenous left third toe and pt underwent a BKA at that time.  He is seen in clinic and found to have wound dehiscence so we will recommend undergoing or for I&D and wound VAC placement.  Subsequently after incision and debridement the TRH team was asked to admit this patient for further management and wound care.   Nephrology has been consulted for maintenance of hemodialysis.  PT OT evaluating and OT recommending home health.  Patient's wound VAC was not functioning properly so this is being replaced by vascular surgery team on 04/14/2024.  Assessment and Plan:  Recent L BKA for Critical Limb ischemia w/ tissue loss and a Nonhealing L TMA s/p wound Dehiscence and Nonhealing Wound: S/p left leg below the knee amputation incision and debridement with wound VAC placement. Defer to Vascular Surgery for further management.  Left BKA VAC was not functioning properly so vascular is planning to take this down and reapply later today however per the attending they will replace the wound VAC tomorrow.  TOC has been consulted for home health RN with recommendations for home VAC to be changed twice a week. PT/OT consulted and OT recommending home health with PT evaluation still pending; continue pain control with p.o. acetaminophen  650 mg Q6 as needed for mild pain, p.o. oxycodone  5 mg IR Q4 as needed severe pain.  VTE prophylaxis with enoxaparin  40 mL SQ    Chronic Systolic CHF/HFrEF:  BNP was 736.6. C/w Carvedilol  6.25 mg po BID. Minimize IVF. Volume Maintenance via HD. Strict I's and O's, Daily weights. CTM for S/Sx of Volume overload.    HLD: C/w Atorvastatin  20 mg po daily  DMT2: Was fairly well-controlled with last hemoglobin A1c being 5.8 in our system. Check HbA1c in the AM. Place on Very Sensitive Novolog  SSI AC/HS  History of PAD: His aspirin  is being held currently and will resume once okay with vascular surgery.  Continue with statin as above.  Reportedly no longer taking clopidogrel   Hypothyroidism: Check TSH in the AM; C/w Levothyroxine  50 mcg po Daily   ESRD on HD TThSat / Hyperphosphatemia: BUN/Cr went from 47/4.90 -> 49/5.62 and Phos Level is 6.9. Nephrology consulted for maintenance of HD and patient take for HD 11/11. C/w Sevelamer  Carbonate 800 mg po TID. Avoid Nephrotoxic Medications, Contrast Dyes, Hypotension and Dehydration to Ensure Adequate Renal Perfusion and will need to Renally Adjust Meds. CTM & Trend Renal Function carefully & repeat CMP in the AM   Normocytic Anemia / Anemia of Chronic Kidney Disease: Hgb/Hct went from 10.1/31.6 -> 9.8/31.0. Check Anemia Panel in the AM. CTM for S/Sx of Bleeding; No overt bleeding noted. Repeat CBC in the AM  Thrombocytopenia: Plt Count went from 108 -> 134. CTM for S/Sx of Bleeding; No overt bleeding noted. Repeat CBC in the AM   Hypoalbuminemia: Patient's Albumin  Lvl is now 2.4. CTM & Trend & repeat CMP in the AM   DVT prophylaxis: enoxaparin  (LOVENOX ) injection 40 mg Start: 04/13/24  0600    Code Status: Full Code Family Communication: No family at bedside  Disposition Plan:  Level of care: Med-Surg Status is: Inpatient Remains inpatient appropriate because: Needs further clinical improvement, evaluation by PT and clearance by the specialists   Consultants:  Vascular Surgery Nephrology  Procedures:  As delineated as above;  PROCEDURE done by Dr. Fonda Rim on 04/12/24:  Left leg below-knee  amputation incision, debridement 500 mg myriad morcell powder placement Vacuum-assisted closure-blue sponge 5 x 3 x 2 cm  Antimicrobials:  Anti-infectives (From admission, onward)    Start     Dose/Rate Route Frequency Ordered Stop   04/12/24 1239  ceFAZolin  (ANCEF ) IVPB 2g/100 mL premix        2 g 200 mL/hr over 30 Minutes Intravenous 30 min pre-op  04/12/24 1239 04/12/24 1544       Subjective: Seen and examined at bedside and was having some pain.  Feels okay.  Hoping to go to dialysis soon.  No nausea or vomiting.  No other concerns or complaints at this time.  Objective: Vitals:   04/13/24 1700 04/13/24 1724 04/13/24 1725 04/13/24 1806  BP: (!) 122/42 117/68 125/65 120/65  Pulse: 75 78 75 77  Resp: 15 16 13 16   Temp:   97.8 F (36.6 C) 97.6 F (36.4 C)  TempSrc:      SpO2: 98% 99% 100% 99%  Weight:   71 kg   Height:        Intake/Output Summary (Last 24 hours) at 04/13/2024 1843 Last data filed at 04/13/2024 1830 Gross per 24 hour  Intake --  Output 2405 ml  Net -2405 ml   Filed Weights   04/12/24 1211 04/13/24 1354 04/13/24 1725  Weight: 78.9 kg 73.5 kg 71 kg   Examination: Physical Exam:  Constitutional: Chronically ill-appearing AAM who appears Calm and in no acute distress Respiratory: Slightly diminsihed to auscultation bilaterally, no wheezing, rales, rhonchi or crackles. Normal respiratory effort and patient is not tachypenic. No accessory muscle use. Unlabored breathing.  Cardiovascular: RRR, no murmurs / rubs / gallops. S1 and S2 auscultated.  Has a left BKA which is connected to wound VAC Abdomen: Soft, non-tender, non-distended. Bowel sounds positive.  GU: Deferred. Musculoskeletal: No clubbing / cyanosis of digits/nails. No joint deformity upper and lower extremities.  Has a left arm AV fistula and a right IJ TDC Skin: No rashes, lesions, ulcers on limited skin evaluation. No induration; Warm and dry.  Neurologic: CN 2-12 grossly intact with no  focal deficits. Romberg sign and cerebellar reflexes not assessed.  Psychiatric: Normal judgment and insight. Alert and oriented x 3. Normal mood and appropriate affect.   Data Reviewed: I have personally reviewed following labs and imaging studies  CBC: Recent Labs  Lab 04/12/24 1354 04/12/24 2040 04/13/24 0938  WBC  --  5.7 8.0  NEUTROABS  --   --  6.4  HGB 10.5* 10.1* 9.8*  HCT 31.0* 31.6* 31.0*  MCV  --  98.1 96.9  PLT  --  108* 134*   Basic Metabolic Panel: Recent Labs  Lab 04/12/24 1354 04/12/24 2040 04/13/24 0407 04/13/24 0938  NA 140  --  139  --   K 3.9  --  4.2  --   CL 100  --  99  --   CO2  --   --  23  --   GLUCOSE 94  --  158*  --   BUN 47*  --  49*  --   CREATININE  4.90* 5.23* 5.62*  --   CALCIUM   --   --  8.3*  --   MG  --   --   --  2.0  PHOS  --   --   --  6.9*   GFR: Estimated Creatinine Clearance: 12.6 mL/min (A) (by C-G formula based on SCr of 5.62 mg/dL (H)). Liver Function Tests: Recent Labs  Lab 04/13/24 0938  AST 31  ALT 12  ALKPHOS 211*  BILITOT 0.7  PROT 7.5  ALBUMIN  2.4*   No results for input(s): LIPASE, AMYLASE in the last 168 hours. No results for input(s): AMMONIA in the last 168 hours. Coagulation Profile: No results for input(s): INR, PROTIME in the last 168 hours. Cardiac Enzymes: No results for input(s): CKTOTAL, CKMB, CKMBINDEX, TROPONINI in the last 168 hours. BNP (last 3 results) Recent Labs    07/14/23 1412  PROBNP 11,269*   HbA1C: No results for input(s): HGBA1C in the last 72 hours. CBG: Recent Labs  Lab 04/12/24 1209 04/12/24 1519 04/12/24 1650 04/13/24 0609  GLUCAP 125* 79 89 142*   Lipid Profile: No results for input(s): CHOL, HDL, LDLCALC, TRIG, CHOLHDL, LDLDIRECT in the last 72 hours. Thyroid  Function Tests: No results for input(s): TSH, T4TOTAL, FREET4, T3FREE, THYROIDAB in the last 72 hours. Anemia Panel: No results for input(s): VITAMINB12,  FOLATE, FERRITIN, TIBC, IRON , RETICCTPCT in the last 72 hours. Sepsis Labs: No results for input(s): PROCALCITON, LATICACIDVEN in the last 168 hours.  No results found for this or any previous visit (from the past 240 hours).   Radiology Studies: No results found.  Scheduled Meds:  atorvastatin   20 mg Oral Daily   carvedilol   6.25 mg Oral BID WC   Chlorhexidine  Gluconate Cloth  6 each Topical Q0600   enoxaparin  (LOVENOX ) injection  40 mg Subcutaneous Q24H   gabapentin   300 mg Oral TID   insulin  aspart  0-5 Units Subcutaneous QHS   [START ON 04/14/2024] insulin  aspart  0-6 Units Subcutaneous TID WC   levothyroxine   50 mcg Oral Q0600   sevelamer  carbonate  800 mg Oral TID WC   Continuous Infusions:   LOS: 1 day   Alejandro Marker, DO [facility] Available via Epic secure chat 7am-7pm After these hours, please refer to coverage provider listed on [email or web address] 04/13/2024, 6:43 PM

## 2024-04-13 NOTE — Progress Notes (Signed)
 Pt receives out-pt HD at [facility] on TTS 10:40 am chair time. Will assist as needed.    Lavanda Jeronda Don Dialysis Navigator [phone]

## 2024-04-13 NOTE — Progress Notes (Signed)
   04/13/24 1725  Vitals  Temp 97.8 F (36.6 C)  Pulse Rate 75  Resp 13  BP 125/65  SpO2 100 %  O2 Device Room Air  Weight 71 kg  Oxygen Therapy  Patient Activity (if Appropriate) In bed  Pulse Oximetry Type Continuous  Oximetry Probe Site Changed No  Post Treatment  Dialyzer Clearance Lightly streaked  Liters Processed 78  Fluid Removed (mL) 2300 mL  Tolerated HD Treatment Yes  Post-Hemodialysis Comments Pt. tolerated tx well  AVG/AVF Arterial Site Held (minutes) 10 minutes  AVG/AVF Venous Site Held (minutes) 10 minutes

## 2024-04-13 NOTE — Progress Notes (Addendum)
  Progress Note    04/13/2024 8:06 AM 1 Day Post-Op  Subjective:  no complaints   Vitals:   04/13/24 0439 04/13/24 0749  BP: 124/66 (!) 144/79  Pulse: 77 77  Resp: 18 16  Temp:  97.7 F (36.5 C)  SpO2: 99% 100%   Physical Exam: Lungs:  non labored Incisions:  L BKA vac not functioning well with clotted blood under the dressing Neurologic: a&O  CBC    Component Value Date/Time   WBC 5.7 04/12/2024 2040   RBC 3.22 (L) 04/12/2024 2040   HGB 10.1 (L) 04/12/2024 2040   HGB 11.5 (L) 05/16/2023 1429   HCT 31.6 (L) 04/12/2024 2040   HCT 35.3 (L) 05/16/2023 1429   PLT 108 (L) 04/12/2024 2040   PLT 144 (L) 05/16/2023 1429   MCV 98.1 04/12/2024 2040   MCV 90 05/16/2023 1429   MCH 31.4 04/12/2024 2040   MCHC 32.0 04/12/2024 2040   RDW 14.8 04/12/2024 2040   RDW 13.5 05/16/2023 1429   LYMPHSABS 0.9 02/11/2024 1853   LYMPHSABS 0.7 05/16/2023 1429   MONOABS 0.5 02/11/2024 1853   EOSABS 0.1 02/11/2024 1853   EOSABS 0.1 05/16/2023 1429   BASOSABS 0.0 02/11/2024 1853   BASOSABS 0.0 05/16/2023 1429    BMET    Component Value Date/Time   NA 139 04/13/2024 0407   NA 145 (H) 07/14/2023 1412   NA 136 08/30/2013 1025   K 4.2 04/13/2024 0407   K 3.7 08/30/2013 1025   CL 99 04/13/2024 0407   CL 101 08/30/2013 1025   CO2 23 04/13/2024 0407   CO2 31 08/30/2013 1025   GLUCOSE 158 (H) 04/13/2024 0407   GLUCOSE 310 (H) 08/30/2013 1025   BUN 49 (H) 04/13/2024 0407   BUN 97 (HH) 07/14/2023 1412   BUN 17 08/30/2013 1025   CREATININE 5.62 (H) 04/13/2024 0407   CREATININE 1.15 08/30/2013 1025   CALCIUM  8.3 (L) 04/13/2024 0407   CALCIUM  9.4 08/30/2013 1025   GFRNONAA 10 (L) 04/13/2024 0407   GFRNONAA >60 08/30/2013 1025   GFRAA 27 (L) 02/22/2020 0539   GFRAA >60 08/30/2013 1025    INR    Component Value Date/Time   INR 1.1 02/09/2024 0515     Intake/Output Summary (Last 24 hours) at 04/13/2024 0806 Last data filed at 04/12/2024 1617 Gross per 24 hour  Intake 400 ml   Output 10 ml  Net 390 ml     Assessment/Plan:  68 y.o. male is s/p L BKA debridement 1 Day Post-Op   Overall, patient doing well L BKA vac is not functioning properly; we will plan to take this down and re-apply later today TOC consulted to arrange [facility] RN with home vac  ADDENDUM: wound vac removed.  Dry compression dressing applied.  We will consult WOC RN team to re-apply wound vac tomorrow if able.  TOC consulted for [facility] RN and home vac to be changed 2x/week.   Donnice Sender, PA-C [facility] [phone] 04/13/2024 8:06 AM   VASCULAR STAFF ADDENDUM: I agree with the above.  Happy with healthy tissue in the BKA stump. Will replace wound VAC tomorrow.    Fonda FORBES Rim MD [facility] Phone Number: [phone] 04/13/2024 2:52 PM

## 2024-04-13 NOTE — Progress Notes (Signed)
 Patient returned to floor from dialysis.  No signs of distress noted.  Currently ordering dinner.

## 2024-04-13 NOTE — Evaluation (Signed)
 Occupational Therapy Evaluation Patient Details Name: Phillip [name] MRN: [id number] DOB: March 03, 1956 Today's Date: 04/13/2024   History of Present Illness   68 y.o. male presents to [facility] 04/12/24 as a triage visit following L BKA on 01/30/24 as the area of incision dehiscence has gotten much larger. S/p Irrigation and debridement and wound vac placement of L BKA 11/10. PMH: TAVR (8/19), osteoarthritis, HTN, ESRD on dialysis, DMII, HLD, CAD, severe aortic stenosis, acute cholecystitis requiring cholecystostomy drain 6/25.     Clinical Impressions Pt presents for I&D of L BKA. Prior to initial amputation, Pt was independent with ADLs/IADLs and functional mobility. Since amputation, Pt requires assistance to transfer to his manual wheelchair and engage in ADL tasks. Pt also receiving HH OT and PT. Pt currently requires CGA for lateral scoot to drop arm recliner and Mod A for sit to stand transfers. Pt requires up to Mod A for ADL task engagement. Pt is primarily limited by abnormalities of gait and balance, LLE pain, and decreased activity tolerance. OT to follow Pt acutely to facilitate progress towards functional goals. OT recommends continuation of HHOT services at d/c.      If plan is discharge home, recommend the following:   A little help with walking and/or transfers;A little help with bathing/dressing/bathroom;Assistance with cooking/housework;Assist for transportation;Help with stairs or ramp for entrance     Functional Status Assessment   Patient has had a recent decline in their functional status and demonstrates the ability to make significant improvements in function in a reasonable and predictable amount of time.     Equipment Recommendations   None recommended by OT (Pt has necessary DME at home)     Recommendations for Other Services         Precautions/Restrictions   Precautions Precautions: Fall;Other (comment) Recall of Precautions/Restrictions:  Intact Precaution/Restrictions Comments: wound vac L residual limb Restrictions Weight Bearing Restrictions Per Provider Order: No     Mobility Bed Mobility Overal bed mobility: Modified Independent             General bed mobility comments: Pt able to come to EOB without physical assistance. Required increased time to square hips. Pt able to return to supine with Mod I.    Transfers Overall transfer level: Needs assistance Equipment used: Rolling walker (2 wheels) Transfers: Sit to/from Stand, Bed to chair/wheelchair/BSC Sit to Stand: Mod assist          Lateral/Scoot Transfers: Contact guard assist General transfer comment: Pt engaged in sit to stand transfer to asses standing balance on RLE. Pt required Mod A to stand with trunk facilitation to power up. Pt unable to transfer in standing. Pt then engaged in lateral scoot to recliner with drop arm. Pt required CGA for lateral scoot with increased time needed to return to bed.      Balance Overall balance assessment: Needs assistance Sitting-balance support: No upper extremity supported, Feet supported Sitting balance-Leahy Scale: Fair Sitting balance - Comments: Pt able to minimally weight shift in sitting with supervision   Standing balance support: Bilateral upper extremity supported, During functional activity, Reliant on assistive device for balance Standing balance-Leahy Scale: Poor Standing balance comment: Pt able to maintain standing balance briefly                           ADL either performed or assessed with clinical judgement   ADL Overall ADL's : Needs assistance/impaired Eating/Feeding: Independent;Sitting   Grooming: Supervision/safety;Sitting  Upper Body Bathing: Supervision/ safety;Sitting   Lower Body Bathing: Moderate assistance;Sitting/lateral leans   Upper Body Dressing : Independent;Sitting   Lower Body Dressing: Moderate assistance;Sitting/lateral leans   Toilet Transfer:  Contact guard assist;BSC/3in1;Requires drop arm Toilet Transfer Details (indicate cue type and reason): lateral scoot drop arm commode Toileting- Clothing Manipulation and Hygiene: Supervision/safety               Vision Patient Visual Report: No change from baseline Vision Assessment?: Wears glasses for reading     Perception         Praxis         Pertinent Vitals/Pain Pain Assessment Pain Assessment: 0-10 Pain Score: 6  Pain Location: L residual limb Pain Descriptors / Indicators:  (stinging.) Pain Intervention(s): Limited activity within patient's tolerance, Monitored during session, Repositioned     Extremity/Trunk Assessment Upper Extremity Assessment Upper Extremity Assessment: Overall WFL for tasks assessed   Lower Extremity Assessment Lower Extremity Assessment: Defer to PT evaluation   Cervical / Trunk Assessment Cervical / Trunk Assessment: Normal   Communication Communication Communication: Impaired Factors Affecting Communication: Reduced clarity of speech   Cognition Arousal: Alert Behavior During Therapy: WFL for tasks assessed/performed Cognition: No apparent impairments                               Following commands: Intact       Cueing  General Comments   Cueing Techniques: Verbal cues;Visual cues;Tactile cues  VSS on RA.   Exercises     Shoulder Instructions      Home Living Family/patient expects to be discharged to:: Private residence Living Arrangements: Spouse/significant other Available Help at Discharge: Family;Available 24 hours/day Type of Home: Mobile home Home Access: Ramped entrance     Home Layout: One level     Bathroom Shower/Tub: Producer, Television/film/video: Handicapped height Bathroom Accessibility: Yes How Accessible: Accessible via walker Home Equipment: BSC/3in1;Wheelchair - manual;Shower seat   Additional Comments: Receiving HH PT and OT.      Prior Functioning/Environment  Prior Level of Function : Needs assist       Physical Assist : Mobility (physical);ADLs (physical) Mobility (physical): Transfers ADLs (physical): Dressing;IADLs;Toileting Mobility Comments: Receives assistance to get into wheelchair from wife. ADLs Comments: Independent with ADLs prior to amputation. Reports receiving assistance in dressing, toileting. States independent with grooming and sponge bathing.    OT Problem List: Decreased activity tolerance;Impaired balance (sitting and/or standing);Decreased knowledge of use of DME or AE;Decreased knowledge of precautions;Pain;Decreased coordination   OT Treatment/Interventions: Self-care/ADL training;Therapeutic exercise;Energy conservation;DME and/or AE instruction;Therapeutic activities;Patient/family education;Balance training      OT Goals(Current goals can be found in the care plan section)   Acute Rehab OT Goals Patient Stated Goal: to get better OT Goal Formulation: With patient Time For Goal Achievement: 04/27/24 Potential to Achieve Goals: Good ADL Goals Pt Will Perform Grooming: with modified independence;sitting (reaching outside BOS to gather supplies) Pt Will Perform Lower Body Bathing: with modified independence;with adaptive equipment Pt Will Perform Lower Body Dressing: with modified independence;with adaptive equipment Pt Will Transfer to Toilet: with modified independence;bedside commode (lateral scoot)   OT Frequency:  Min 2X/week    Co-evaluation              AM-PAC OT 6 Clicks Daily Activity     Outcome Measure Help from another person eating meals?: None Help from another person taking care of personal grooming?: A Little  Help from another person toileting, which includes using toliet, bedpan, or urinal?: A Little Help from another person bathing (including washing, rinsing, drying)?: A Lot Help from another person to put on and taking off regular upper body clothing?: None Help from another person  to put on and taking off regular lower body clothing?: A Lot 6 Click Score: 18   End of Session Equipment Utilized During Treatment: Gait belt;Rolling walker (2 wheels)  Activity Tolerance: Patient tolerated treatment well Patient left: in bed;with call bell/phone within reach  OT Visit Diagnosis: Other abnormalities of gait and mobility (R26.89);Muscle weakness (generalized) (M62.81);Pain Pain - Right/Left: Left Pain - part of body: Leg                Time: 9086-9046 OT Time Calculation (min): 40 min Charges:  OT General Charges $OT Visit: 1 Visit OT Evaluation $OT Eval Moderate Complexity: 1 Mod OT Treatments $Therapeutic Activity: 23-37 mins  Maurilio CROME, OTR/L.  [facility] Acute Rehabilitation  Office: [phone]   Maurilio PARAS Ladora Osterberg 04/13/2024, 11:17 AM

## 2024-04-13 NOTE — Consult Note (Signed)
 Renal Service Consult Note [facility] Lamar JONETTA Fret, MD  Patient: Phillip [name] Date: 04/13/2024 Requesting Physician: Dr. Sherrill  Reason for Consult: ESRD pt w/ chest pain HPI: The patient is a 68 y.o. year-old w/ PMH as below who was admitted yesterday for non-healing L BKA wound. Pt went to OR per VVS yesterday, and was admitted post-op. Pt is on HD TTS schedule. We are asked to see for esrd.    Pt seen in room, in good spirits. Says they have using 1 or 2 needles in the AVF, but pleading for us  here to us  the catheter. No sob or cough or LE swelling.    ROS - denies CP, no joint pain, no HA, no blurry vision, no rash, no diarrhea, no nausea/ vomiting   Past Medical History  Past Medical History:  Diagnosis Date   Abdominopelvic abscess (HCC) 09/29/2018   Abscess of appendix 09/22/2018   Acquired spondylolisthesis of lumbosacral region 03/20/2020   Acute cholecystitis 11/15/2023   Anemia of chronic disease 08/18/2019   Anemia, chronic disease 08/18/2019   Aortic atherosclerosis    Aortic stenosis    a.) TTE 09/15/2018: mild-mod (MPG 19); b.) TTE 03/21/2021: mild-mod (MPG 14.3); c.) TTE 04/24/2022: mild- mod (MPG 15)   Appendicitis 09/08/2018   Asthma    Atherosclerosis of native arteries of the extremities with ulceration (HCC) 02/24/2018   Formatting of this note might be different from the original.  Last Assessment & Plan:   His ABIs today are 1.07 on the right and 0.99 on the left with biphasic waveforms.  Although these pressures may be somewhat elevated from medial calcification, his flow does appear to be reasonably good.  His left ABI was 0.58 prior to intervention.  At this point, we will stretch out his follow-up and see hi   Benign prostatic hyperplasia without lower urinary tract symptoms 01/14/2018   Bilateral lower extremity edema 11/04/2018   Bradycardia 03/25/2018   CAD (coronary artery disease) 2003   a.) s/p 4v CABG 2003    Choledocholithiasis 11/12/2023   Chronic combined systolic and diastolic CHF (congestive heart failure) (HCC) 07/26/2015   a.) TTE 09/15/2018: EF 50-55%, mod LVH, RVE, BAE, mild-mod TR, AoV sclerosis, G1DD; b.) MPI 10/27/2019: EF <30%; c.) TTE 03/21/2021: EF 35-40%, post AK, inf HK, mod LVH, mod red RVSF, mod LAE, mod Aov sclerosis, G2DD; c.) TTE 04/24/2022: EF 35-40%, post AK, glok HK, mod LVE, mod red RVSF, mild-mod MR, Aov sclerosis, G3DD   Chronic idiopathic constipation 11/02/2018   Chronic low back pain without sciatica 12/02/2018   Chronic pain of right hip 03/31/2020   Coronary artery disease    Diabetes mellitus without complication (HCC)    Dysphagia 07/26/2015   Encounter for long-term (current) use of insulin  (HCC) 09/27/2020   Erectile dysfunction 07/14/2017   ESRD on dialysis (HCC) 11/15/2023   Essential hypertension 02/24/2018   Gangrene of toe of left foot (HCC) 01/14/2024   Hematuria 07/27/2015   History of marijuana use    Hyperlipidemia 07/26/2015   Hyperlipidemia associated with type 2 diabetes mellitus (HCC) 02/18/2020   Hypertension    Hypothyroidism (acquired) 11/14/2015   Insomnia 11/02/2018   Left below-knee amputee (HCC) 02/05/2024   Osseous and subluxation stenosis of intervertebral foramina of lumbar region 03/20/2020   Osteoarthritis 10/06/2019   Pancytopenia (HCC) 08/12/2023   Peripheral vascular disease    Personal history of tobacco use, presenting hazards to health 09/27/2020   Polyneuropathy in diabetes (HCC) 05/07/2019  Primary osteoarthritis involving multiple joints 10/06/2019   Primary osteoarthritis of right hip 02/21/2020   Protein-calorie malnutrition, severe 02/07/2024   Pulmonary HTN (HCC)    a.) TTE 05/12/2019: PASP 71; b.) TTE 09/27/2020: PASP >70; c.) TTE 03/21/2021: RVSP 43; d.) TTE 04/24/2022: RVSP 80.6   Puncture wound of right hip 04/02/2018   S/P CABG x 4 2003   S/P TAVR (transcatheter aortic valve replacement) 01/20/2024   TAVR  with a 29 mm Edwards Sapien 3 Ultra Resilia THV via the TF by Dr. Wendel and Dr. Shyrl   Sleep apnea 07/26/2015   a.) does not require nocturnal PAP therapy   Subacute osteomyelitis of left foot (HCC) 03/25/2018   Thrombocytopenia    Type 2 diabetes mellitus with hyperlipidemia (HCC) 07/26/2015   Vitamin B12 deficiency 06/16/2019   Vitamin D  deficiency 05/07/2018   Past Surgical History  Past Surgical History:  Procedure Laterality Date   AMPUTATION Left 01/14/2024   Procedure: AMPUTATION, FOOT, PARTIAL;  Surgeon: Serene Gaile ORN, MD;  Location: [facility] OR;  Service: Vascular;  Laterality: Left;   AMPUTATION Left 01/30/2024   Procedure: AMPUTATION BELOW KNEE;  Surgeon: Serene Gaile ORN, MD;  Location: [facility] OR;  Service: Vascular;  Laterality: Left;   AMPUTATION TOE Left 03/13/2018   Procedure: AMPUTATION TOE-MPJ;  Surgeon: Ashley Soulier, DPM;  Location: [facility] ORS;  Service: Podiatry;  Laterality: Left;   ANGIOPLASTY     AV FISTULA PLACEMENT Left 08/20/2023   Procedure: ARTERIOVENOUS (AV) FISTULA CREATION;  Surgeon: Gretta Lonni PARAS, MD;  Location: [facility] OR;  Service: Vascular;  Laterality: Left;   CARDIAC CATHETERIZATION     CORONARY ARTERY BYPASS GRAFT N/A 2003   INTRAOPERATIVE TRANSTHORACIC ECHOCARDIOGRAM N/A 01/20/2024   Procedure: ECHOCARDIOGRAM, TRANSTHORACIC;  Surgeon: Wendel Lurena POUR, MD;  Location: [facility] INVASIVE CV LAB;  Service: Cardiovascular;  Laterality: N/A;   IR CATHETER TUBE CHANGE  12/31/2023   IR DIALY SHUNT INTRO NEEDLE/INTRACATH INITIAL W/IMG LEFT Left 02/09/2024   IR EXCHANGE BILIARY DRAIN  02/03/2024   IR EXCHANGE BILIARY DRAIN  02/17/2024   IR EXCHANGE BILIARY DRAIN  03/05/2024   IR FLUORO GUIDE CV LINE RIGHT  08/18/2023   IR PERC CHOLECYSTOSTOMY  11/12/2023   IR RADIOLOGIST EVAL & MGMT  12/31/2023   IR TUNNELED CENTRAL VENOUS CATH PLC W IMG  02/23/2024   IR US  GUIDE VASC ACCESS RIGHT  08/18/2023   LAPAROSCOPIC APPENDECTOMY N/A 09/08/2018   Procedure: APPENDECTOMY  LAPAROSCOPIC;  Surgeon: Jordis Laneta FALCON, MD;  Location: [facility] ORS;  Service: General;  Laterality: N/A;   LOWER EXTREMITY ANGIOGRAPHY Left 03/02/2018   Procedure: LOWER EXTREMITY ANGIOGRAPHY;  Surgeon: Marea Selinda RAMAN, MD;  Location: [facility] INVASIVE CV LAB;  Service: Cardiovascular;  Laterality: Left;   LOWER EXTREMITY ANGIOGRAPHY Left 09/30/2022   Procedure: Lower Extremity Angiography;  Surgeon: Marea Selinda RAMAN, MD;  Location: [facility] INVASIVE CV LAB;  Service: Cardiovascular;  Laterality: Left;   LOWER EXTREMITY ANGIOGRAPHY Left 12/26/2023   Procedure: Lower Extremity Angiography;  Surgeon: Marea Selinda RAMAN, MD;  Location: [facility] INVASIVE CV LAB;  Service: Cardiovascular;  Laterality: Left;   RIGHT/LEFT HEART CATH AND CORONARY/GRAFT ANGIOGRAPHY N/A 08/01/2023   Procedure: RIGHT/LEFT HEART CATH AND CORONARY/GRAFT ANGIOGRAPHY;  Surgeon: Wendel Lurena POUR, MD;  Location: [facility];  Service: Cardiovascular;  Laterality: N/A;   ROTATOR CUFF REPAIR Left    TRANSMETATARSAL AMPUTATION Left 01/16/2024   Procedure: AMPUTATION, FOOT, TRANSMETATARSAL;  Surgeon: Serene Gaile ORN, MD;  Location: [facility] OR;  Service: Vascular;  Laterality:  Left;   Family History  Family History  Problem Relation Age of Onset   Hyperlipidemia Mother    Hypertension Mother    Diabetes Mother    Heart attack Brother    Heart disease Brother    Lung disease Father    Social History  reports that he quit smoking about 22 years ago. His smoking use included cigarettes. He started smoking about 47 years ago. He has a 12.5 pack-year smoking history. He has never used smokeless tobacco. He reports that he does not currently use alcohol. He reports that he does not use drugs. Allergies No Known Allergies Home medications Prior to Admission medications   Medication Sig Start Date End Date Taking? Authorizing Provider  acetaminophen  (TYLENOL ) 325 MG tablet Take 2 tablets (650 mg total) by mouth every 6 (six) hours as needed for mild pain (pain  score 1-3) or fever (or Fever >/= 101). 02/19/24  Yes Angiulli, Toribio PARAS, PA-C  aspirin  EC 81 MG tablet Take 1 tablet (81 mg total) by mouth daily. Swallow whole. 07/25/23  Yes Thukkani, Arun K, MD  atorvastatin  (LIPITOR) 20 MG tablet Take 1 tablet (20 mg total) by mouth daily. 02/19/24  Yes Angiulli, Toribio PARAS, PA-C  B Complex-C (B-COMPLEX WITH VITAMIN C) tablet Take 1 tablet by mouth daily. 02/19/24  Yes Angiulli, Toribio PARAS, PA-C  carvedilol  (COREG ) 6.25 MG tablet Take 6.125 mg (1 tablet) in the morning and take 3.125 mg (0.5 tablet) in the evening. 03/03/24  Yes Revankar, Jennifer SAUNDERS, MD  gabapentin  (NEURONTIN ) 100 MG capsule Take 2 capsules (200 mg total) by mouth at bedtime. Patient taking differently: Take 600 mg by mouth 2 (two) times daily. 02/19/24  Yes Angiulli, Toribio PARAS, PA-C  levothyroxine  (SYNTHROID ) 50 MCG tablet Take 1 tablet (50 mcg total) by mouth daily. 02/19/24  Yes Angiulli, Toribio PARAS, PA-C  nitroGLYCERIN  (NITROSTAT ) 0.4 MG SL tablet Place 1 tablet (0.4 mg total) under the tongue every 5 (five) minutes as needed for chest pain. 02/19/24  Yes Angiulli, Toribio PARAS, PA-C  oxyCODONE  (OXY IR/ROXICODONE ) 5 MG immediate release tablet Take 1 tablet (5 mg total) by mouth every 6 (six) hours as needed for severe pain (pain score 7-10). 03/22/24  Yes Baglia, Corrina, PA-C  sevelamer  carbonate (RENVELA ) 800 MG tablet Take 1 tablet (800 mg total) by mouth 3 (three) times daily with meals. 02/19/24 05/16/25 Yes Raulkar, Sven SQUIBB, MD  vitamin D3 (CHOLECALCIFEROL ) 25 MCG tablet Take 1 tablet (1,000 Units total) by mouth daily. 02/19/24  Yes Angiulli, Toribio PARAS, PA-C  cephALEXin (KEFLEX) 500 MG capsule Take 1 capsule (500 mg total) by mouth 3 (three) times daily. Patient not taking: Reported on 04/09/2024 04/08/24   Elna Loud P, PA-C  clopidogrel  (PLAVIX ) 75 MG tablet Take 1 tablet (75 mg total) by mouth daily. Patient not taking: Reported on 04/09/2024 02/19/24   Angiulli, Toribio PARAS, PA-C  docusate sodium  (COLACE)  100 MG capsule Take 1 capsule (100 mg total) by mouth daily. Patient not taking: Reported on 04/09/2024 02/19/24   Angiulli, Toribio PARAS, PA-C  latanoprost  (XALATAN ) 0.005 % ophthalmic solution Place 1 drop into both eyes at bedtime. Patient not taking: Reported on 04/09/2024 02/19/24   Pegge Toribio PARAS, PA-C     Vitals:   04/12/24 2000 04/12/24 2053 04/13/24 0439 04/13/24 0749  BP: (!) 144/99 132/65 124/66 (!) 144/79  Pulse: 87 85 77 77  Resp: 15 18 18 16   Temp:  97.7 F (36.5 C)  97.7 F (36.5 C)  TempSrc:  Oral    SpO2: 95% 99% 99% 100%  Weight:      Height:       Exam Gen alert, no distress Sclera anicteric, throat clear  No jvd or bruits Chest clear bilat to bases RRR no MRG Abd soft ntnd no mass or ascites +bs Ext no LE or UE edema, no other edema Neuro is alert, Ox 3 , nf    LUA AVF+ bruit , RIJ TDC  Home bp meds: Coreg  6.125 bid   OP HD: TTS [place] 4h  B350   L AVF/ RIJ TDC   Heparin  none    Assessment/ Plan: L BKA non-healing wound: went to OR yest w/ [facility].  ESRD: on HD TTS. Plan HD this afternoon most likely. HTN: bp's stable Volume: no vol excess, UF 2 L goal Anemia of esrd: Hb 10, follow.  H/o TAVR: aug 2025    Phillip Fret  MD [facility] 04/13/2024, 9:11 AM  Recent Labs  Lab 04/12/24 1354 04/12/24 2040 04/13/24 0407  HGB 10.5* 10.1*  --   CALCIUM   --   --  8.3*  CREATININE 4.90* 5.23* 5.62*  K 3.9  --  4.2   Inpatient medications:  atorvastatin   20 mg Oral Daily   carvedilol   6.25 mg Oral BID WC   enoxaparin  (LOVENOX ) injection  40 mg Subcutaneous Q24H   gabapentin   300 mg Oral TID   levothyroxine   50 mcg Oral Q0600   sevelamer  carbonate  800 mg Oral TID WC    acetaminophen , oxyCODONE 

## 2024-04-13 NOTE — Plan of Care (Signed)

## 2024-04-13 NOTE — Progress Notes (Signed)
 PT Cancellation Note  Patient Details Name: Phillip [name] MRN: [id number] DOB: 1956-06-01   Cancelled Treatment:    Reason Eval/Treat Not Completed: Patient at procedure or test/unavailable (Pt in HD; will follow up as able and appropriate.)  Dorothyann Maier, DPT, CLT  Acute Rehabilitation Services Office: [phone] (Secure chat preferred)   Dorothyann VEAR Maier 04/13/2024, 2:12 PM

## 2024-04-13 NOTE — Hospital Course (Addendum)
 Phillip [name] is a 68 y.o. male with medical history significant of ESRD on HD TThSat, DM2, recent acute cholecystitis in June 2025 requiring colostomy tube placement, chronic HFrEF with last EF of 30% in 2025, aortic stenosis status post TAVR 8/19/ 2025, PAD status post PCI to the left tibia-fibula on 12/26/2023 admitted by vascular surgery on 01/14/2024 for elective left third toe amputation due to gangrenous left third toe and pt underwent a BKA at that time.  He is seen in clinic and found to have wound dehiscence so we will recommend undergoing or for I&D and wound VAC placement.  Subsequently after incision and debridement the TRH team was asked to admit this patient for further management and wound care.   Nephrology has been consulted for maintenance of hemodialysis.  PT OT evaluating and OT recommending home health.  Patient's wound VAC was not functioning properly so this is being replaced by vascular surgery team on 04/14/2024.  Assessment and Plan:  Recent L BKA for Critical Limb ischemia w/ tissue loss and a Nonhealing L TMA s/p wound Dehiscence and Nonhealing Wound: S/p left leg below the knee amputation incision and debridement with wound VAC placement. Defer to Vascular Surgery for further management.  Left BKA VAC was not functioning properly so vascular is planning to take this down and reapply later today however per the attending they will replace the wound VAC tomorrow.  TOC has been consulted for home health RN with recommendations for home VAC to be changed twice a week. PT/OT consulted and OT recommending home health with PT evaluation still pending; continue pain control with p.o. acetaminophen  650 mg Q6 as needed for mild pain, p.o. oxycodone  5 mg IR Q4 as needed severe pain.  VTE prophylaxis with enoxaparin  40 mL SQ    Chronic Systolic CHF/HFrEF: BNP was 736.6. C/w Carvedilol  6.25 mg po BID. Minimize IVF. Volume Maintenance via HD. Strict I's and O's, Daily weights. CTM for S/Sx  of Volume overload.    HLD: C/w Atorvastatin  20 mg po daily  DMT2: Was fairly well-controlled with last hemoglobin A1c being 5.8 in our system. Check HbA1c in the AM. Place on Very Sensitive Novolog  SSI AC/HS  History of PAD: His aspirin  is being held currently and will resume once okay with vascular surgery.  Continue with statin as above.  Reportedly no longer taking clopidogrel   Hypothyroidism: Check TSH in the AM; C/w Levothyroxine  50 mcg po Daily   ESRD on HD TThSat / Hyperphosphatemia: BUN/Cr went from 47/4.90 -> 49/5.62 and Phos Level is 6.9. Nephrology consulted for maintenance of HD and patient take for HD 11/11. C/w Sevelamer  Carbonate 800 mg po TID. Avoid Nephrotoxic Medications, Contrast Dyes, Hypotension and Dehydration to Ensure Adequate Renal Perfusion and will need to Renally Adjust Meds. CTM & Trend Renal Function carefully & repeat CMP in the AM   Normocytic Anemia / Anemia of Chronic Kidney Disease: Hgb/Hct went from 10.1/31.6 -> 9.8/31.0. Check Anemia Panel in the AM. CTM for S/Sx of Bleeding; No overt bleeding noted. Repeat CBC in the AM  Thrombocytopenia: Plt Count went from 108 -> 134. CTM for S/Sx of Bleeding; No overt bleeding noted. Repeat CBC in the AM   Hypoalbuminemia: Patient's Albumin  Lvl is now 2.4. CTM & Trend & repeat CMP in the AM

## 2024-04-13 NOTE — Progress Notes (Signed)
 Pt. Came in on bed with no complaints. Consent signed and on file.  Patient started with no complaints  UF goal: Tx duration: 3.25 hours  Access used: Left AVF Access Issue: None  Pt. Complained of pain on the abdomen and back. Please see MAR  Tx completed and tolerated. Pressure dressing applied. Endorsed to floor nurse. Transported to room.  Alastair Hennes Rubi Johan Creveling, RN [facility]

## 2024-04-14 ENCOUNTER — Ambulatory Visit

## 2024-04-14 DIAGNOSIS — M542 Cervicalgia: Secondary | ICD-10-CM | POA: Diagnosis not present

## 2024-04-14 DIAGNOSIS — N186 End stage renal disease: Secondary | ICD-10-CM | POA: Diagnosis not present

## 2024-04-14 DIAGNOSIS — T8130XA Disruption of wound, unspecified, initial encounter: Secondary | ICD-10-CM

## 2024-04-14 DIAGNOSIS — Z89512 Acquired absence of left leg below knee: Secondary | ICD-10-CM | POA: Diagnosis not present

## 2024-04-14 LAB — CBC WITH DIFFERENTIAL/PLATELET
Abs Immature Granulocytes: 0.03 K/uL (ref 0.00–0.07)
Basophils Absolute: 0.1 K/uL (ref 0.0–0.1)
Basophils Relative: 1 %
Eosinophils Absolute: 0.2 K/uL (ref 0.0–0.5)
Eosinophils Relative: 3 %
HCT: 31.9 % — ABNORMAL LOW (ref 39.0–52.0)
Hemoglobin: 10.2 g/dL — ABNORMAL LOW (ref 13.0–17.0)
Immature Granulocytes: 1 %
Lymphocytes Relative: 18 %
Lymphs Abs: 1 K/uL (ref 0.7–4.0)
MCH: 31.1 pg (ref 26.0–34.0)
MCHC: 32 g/dL (ref 30.0–36.0)
MCV: 97.3 fL (ref 80.0–100.0)
Monocytes Absolute: 0.5 K/uL (ref 0.1–1.0)
Monocytes Relative: 9 %
Neutro Abs: 3.7 K/uL (ref 1.7–7.7)
Neutrophils Relative %: 68 %
Platelets: 118 K/uL — ABNORMAL LOW (ref 150–400)
RBC: 3.28 MIL/uL — ABNORMAL LOW (ref 4.22–5.81)
RDW: 14.5 % (ref 11.5–15.5)
WBC: 5.4 K/uL (ref 4.0–10.5)
nRBC: 0 % (ref 0.0–0.2)

## 2024-04-14 LAB — PHOSPHORUS: Phosphorus: 4.3 mg/dL (ref 2.5–4.6)

## 2024-04-14 LAB — COMPREHENSIVE METABOLIC PANEL WITH GFR
ALT: 8 U/L (ref 0–44)
AST: 28 U/L (ref 15–41)
Albumin: 2.4 g/dL — ABNORMAL LOW (ref 3.5–5.0)
Alkaline Phosphatase: 201 U/L — ABNORMAL HIGH (ref 38–126)
Anion gap: 13 (ref 5–15)
BUN: 28 mg/dL — ABNORMAL HIGH (ref 8–23)
CO2: 27 mmol/L (ref 22–32)
Calcium: 8.2 mg/dL — ABNORMAL LOW (ref 8.9–10.3)
Chloride: 97 mmol/L — ABNORMAL LOW (ref 98–111)
Creatinine, Ser: 3.83 mg/dL — ABNORMAL HIGH (ref 0.61–1.24)
GFR, Estimated: 16 mL/min — ABNORMAL LOW (ref >60)
Glucose, Bld: 113 mg/dL — ABNORMAL HIGH (ref 70–99)
Potassium: 3.6 mmol/L (ref 3.5–5.1)
Sodium: 137 mmol/L (ref 135–145)
Total Bilirubin: 0.8 mg/dL (ref 0.0–1.2)
Total Protein: 7.3 g/dL (ref 6.5–8.1)

## 2024-04-14 LAB — HEPATITIS B SURFACE ANTIBODY, QUANTITATIVE: Hep B S AB Quant (Post): 99.3 m[IU]/mL

## 2024-04-14 LAB — GLUCOSE, CAPILLARY
Glucose-Capillary: 119 mg/dL — ABNORMAL HIGH (ref 70–99)
Glucose-Capillary: 105 mg/dL — ABNORMAL HIGH (ref 70–99)
Glucose-Capillary: 175 mg/dL — ABNORMAL HIGH (ref 70–99)
Glucose-Capillary: 142 mg/dL — ABNORMAL HIGH (ref 70–99)

## 2024-04-14 LAB — FOLATE: Folate: 13.3 ng/mL (ref >5.9)

## 2024-04-14 LAB — FERRITIN: Ferritin: 542 ng/mL — ABNORMAL HIGH (ref 24–336)

## 2024-04-14 LAB — RETICULOCYTES
Immature Retic Fract: 7.9 % (ref 2.3–15.9)
RBC.: 3.21 MIL/uL — ABNORMAL LOW (ref 4.22–5.81)
Retic Count, Absolute: 50.7 K/uL (ref 19.0–186.0)
Retic Ct Pct: 1.6 % (ref 0.4–3.1)

## 2024-04-14 LAB — IRON AND TIBC
Iron: 81 ug/dL (ref 45–182)
Saturation Ratios: 31 % (ref 17.9–39.5)
TIBC: 265 ug/dL (ref 250–450)
UIBC: 184 ug/dL

## 2024-04-14 LAB — MAGNESIUM: Magnesium: 1.9 mg/dL (ref 1.7–2.4)

## 2024-04-14 LAB — HEMOGLOBIN A1C
Hgb A1c MFr Bld: 4.4 % — ABNORMAL LOW (ref 4.8–5.6)
Mean Plasma Glucose: 79.58 mg/dL

## 2024-04-14 LAB — VITAMIN B12: Vitamin B-12: 579 pg/mL (ref 180–914)

## 2024-04-14 MED ORDER — LIDOCAINE 5 % EX PTCH: 1.0000 | MEDICATED_PATCH | CUTANEOUS | 0 refills | Status: DC

## 2024-04-14 MED ORDER — ASPIRIN 81 MG PO TBEC
81.0000 mg | DELAYED_RELEASE_TABLET | Freq: Every day | ORAL | Status: DC
  Administered 2024-04-14 – 2024-04-15 (×2): 81 mg via ORAL
  Filled 2024-04-14 (×2): qty 1

## 2024-04-14 MED ORDER — LIDOCAINE 5 % EX PTCH
1.0000 | MEDICATED_PATCH | CUTANEOUS | Status: DC
  Administered 2024-04-14: 1 via TRANSDERMAL
  Filled 2024-04-14: qty 1

## 2024-04-14 NOTE — Discharge Summary (Addendum)
 Physician Discharge Summary   Patient: Phillip [name] MRN: [id number] DOB: 1955-06-08  Admit date:     04/12/2024  Discharge date: 04/14/24  Discharge Physician: Alejandro Marker, DO   PCP: [name] LABOR., MD   Recommendations at discharge:   Follow-up with PCP within 2 weeks repeat CBC, CMP, mag, Phos within 1 week Follow-up with vascular surgery in outpatient setting in a few weeks and have home health nurse changed wound VAC twice weekly on Monday and Thursday Follow-up with Nephrology in the outpatient setting within 1 week and continue maintenance of HD  Discharge Diagnoses: Principal Problem:   S/P BKA (below knee amputation) unilateral, left (HCC)  Resolved Problems:   * No resolved hospital problems. *  Hospital Course: Phillip [name] is a 68 y.o. male with medical history significant of ESRD on HD TThSat, DM2, recent acute cholecystitis in June 2025 requiring colostomy tube placement, chronic HFrEF with last EF of 30% in 2025, aortic stenosis status post TAVR 8/19/ 2025, PAD status post PCI to the left tibia-fibula on 12/26/2023 admitted by vascular surgery on 01/14/2024 for elective left third toe amputation due to gangrenous left third toe and pt underwent a BKA at that time.  He is seen in clinic and found to have wound dehiscence so we will recommend undergoing or for I&D and wound VAC placement.  Subsequently after incision and debridement the TRH team was asked to admit this patient for further management and wound care.   Nephrology has been consulted for maintenance of hemodialysis.  PT OT evaluating and OT recommending home health.  Patient's wound VAC was not functioning properly so this is being replaced by vascular surgery team on 04/14/2024.  Today there is difficulty receiving his wound VAC but subsequently authorization was received late this afternoon.  He is medically stable for discharge and will need to follow-up with PCP, Vascular Surgery, and Nephrology in  outpatient setting within 1 to 2 weeks  Assessment and Plan:  Recent L BKA for Critical Limb ischemia w/ tissue loss and a Nonhealing L TMA s/p wound Dehiscence and Nonhealing Wound: S/p left leg below the knee amputation incision and debridement with wound VAC placement. Defer to Vascular Surgery for further management.  Left BKA VAC was not functioning properly so vascular was planning to take this down and reapply later 11/11; however per the Vascular Attending they will replace the wound VAC 11/12 -WOC nurse consulted his wound was cleansed with normal saline and patted dry and a barrier in the periwound was used to promote the seal.  1 piece of black foam and was covered with a drape and applied a TRAC - TOC has been consulted for home health RN with recommendations for home VAC to be changed twice a week (Monday/Thursday). PT/OT consulted and OT recommending home health with PT evaluation also recommending SNF; continue pain control with p.o. acetaminophen  650 mg Q6 as needed for mild pain, p.o. oxycodone  5 mg IR Q4 as needed severe pain.  VTE prophylaxis with enoxaparin  40 mL SQ  -Patient is much improved and medically stable for discharge however the hold-up was obtaining a VAC for the home and home health RN for the [facility] changes twice weekly; This has now has been obtained as of 1649 per TOC note.  He is medically stable for discharge and can follow-up in outpatient setting within 1 to 2 weeks as he will need to go with Home Health   Chronic Systolic CHF/HFrEF: BNP was 736.6. C/w Carvedilol   6.25 mg po BID. Minimize IVF. Volume Maintenance via HD. Strict I's and O's, Daily weights. CTM for S/Sx of Volume overload. SpO2: 100 % O2 Flow Rate (L/min): 2 L/min  Neck Pain: Tender to palpate occiput. ? Related to Occipital Nerve. Add Lidocaine  Patch and C/w Gabapentin . Also try K Pad. If not improving will consider Neck imaging but patient feels better with Lidocaine  Patch   HLD: C/w Atorvastatin  20 mg  po daily  DMT2: Was fairly well-controlled with last Hemoglobin A1c being 5.8 in our system. Check HbA1c and repeat is now 4.4. Place on Very Sensitive Novolog  SSI AC/HS. CBG Trend:  Recent Labs  Lab 04/12/24 1519 04/12/24 1650 04/13/24 0609 04/13/24 2013 04/14/24 0536 04/14/24 1109 04/14/24 1603  GLUCAP 79 89 142* 157* 119* 105* 175*   History of PAD: His Aspirin  81 mg po Daily was being held currently and now resumed as it is okay with Vascular Surgery.  Continue with statin as above.  Reportedly no longer taking clopidogrel   Hypothyroidism: Check TSH in the AM; C/w Levothyroxine  50 mcg po Daily   ESRD on HD TThSat / Hyperphosphatemia: BUN/Cr went from 47/4.90 -> 49/5.62 -> 28/3.83 and Phos Level is now gone from 6.9 -> 4.3. Nephrology consulted for maintenance of HD and took the patient for HD 11/11. C/w Sevelamer  Carbonate 800 mg po TID. Avoid Nephrotoxic Medications, Contrast Dyes, Hypotension and Dehydration to Ensure Adequate Renal Perfusion and will need to Renally Adjust Meds. CTM & Trend Renal Function carefully & repeat CMP in the AM  -**ADDENDUM: Patient does not have a ride home anymore at this time and does not think he can make it to his dialysis session at [place] [facility] in the morning.  He remains medically stable to be discharged and per discussion with the on-call nephrologist Dr. Dietrich they will dialyze him first thing in the morning and patient can be discharged afterwards  Normocytic Anemia / Anemia of Chronic Kidney Disease: Hgb/Hct went from 10.1/31.6 -> 9.8/31.0 -> 10.2/31.9. Checked Anemia Panel noted iron  level 81, UIBC of 84, TIBC 265, saturation ratio 31%, ferritin of 542 and folate level of 13.3 and a vitamin B12 579. CTM for S/Sx of Bleeding; No overt bleeding noted. Repeat CBC in the AM  Thrombocytopenia: Plt Count went from 108 -> 134 -> 118. CTM for S/Sx of Bleeding; No overt bleeding noted. Repeat CBC in the AM   Hypoalbuminemia: Patient's Albumin  Lvl is now  2.4 again. CTM & Trend & repeat CMP in the AM  Consultants: Vascular Surgery; Nephrology Procedures performed: As delineated as above   Disposition: Home health Diet recommendation:  Discharge Diet Orders (From admission, onward)     Start     Ordered   04/14/24 0000  Diet - low sodium heart healthy        04/14/24 1806           Renal diet DISCHARGE MEDICATION: Allergies as of 04/14/2024   No Known Allergies      Medication List     STOP taking these medications    cephALEXin 500 MG capsule Commonly known as: KEFLEX   clopidogrel  75 MG tablet Commonly known as: Plavix    docusate sodium  100 MG capsule Commonly known as: COLACE   latanoprost  0.005 % ophthalmic solution Commonly known as: XALATAN        TAKE these medications    acetaminophen  325 MG tablet Commonly known as: TYLENOL  Take 2 tablets (650 mg total) by mouth every 6 (six) hours as needed  for mild pain (pain score 1-3) or fever (or Fever >/= 101).   aspirin  EC 81 MG tablet Take 1 tablet (81 mg total) by mouth daily. Swallow whole.   atorvastatin  20 MG tablet Commonly known as: LIPITOR Take 1 tablet (20 mg total) by mouth daily.   B-complex with vitamin C tablet Take 1 tablet by mouth daily.   carvedilol  6.25 MG tablet Commonly known as: COREG  Take 6.125 mg (1 tablet) in the morning and take 3.125 mg (0.5 tablet) in the evening.   gabapentin  100 MG capsule Commonly known as: NEURONTIN  Take 2 capsules (200 mg total) by mouth at bedtime. What changed:  how much to take when to take this   levothyroxine  50 MCG tablet Commonly known as: SYNTHROID  Take 1 tablet (50 mcg total) by mouth daily.   lidocaine  5 % Commonly known as: LIDODERM  Place 1 patch onto the skin daily. Remove & Discard patch within 12 hours or as directed by MD Start taking on: April 15, 2024   nitroGLYCERIN  0.4 MG SL tablet Commonly known as: NITROSTAT  Place 1 tablet (0.4 mg total) under the tongue every 5  (five) minutes as needed for chest pain.   oxyCODONE  5 MG immediate release tablet Commonly known as: Oxy IR/ROXICODONE  Take 1 tablet (5 mg total) by mouth every 6 (six) hours as needed for severe pain (pain score 7-10).   sevelamer  carbonate 800 MG tablet Commonly known as: RENVELA  Take 1 tablet (800 mg total) by mouth 3 (three) times daily with meals.   vitamin D3 25 MCG tablet Commonly known as: CHOLECALCIFEROL  Take 1 tablet (1,000 Units total) by mouth daily.               Discharge Care Instructions  (From admission, onward)           Start     Ordered   04/14/24 0000  Discharge wound care:       Comments: Negative pressure wound therapy  Every Mon-Wed-Fri 1000    Comments: Change Monday and Thursday .  Has dialysis Tues/THurS/Sat Question:  Amount of suction?  Answer:  125 mm/Hg   04/14/24 1806            Contact information for follow-up providers     [name] LABOR., MD Follow up.   Specialty: Internal Medicine Contact information: [address] [place] [address] [phone]              Contact information for after-discharge care     Home Medical Care     [facility] [place] [facility]) .   Service: Home Health Services Contact information: [address] [place]  [address] [phone]        [facility]) .   Service: Home Health Services Why: home health services will be provided by [facility] Contact information: [address] [place]  [phone]                    Discharge Exam: Filed Weights   04/12/24 1211 04/13/24 1354 04/13/24 1725  Weight: 78.9 kg 73.5 kg 71 kg   Vitals:   04/14/24 0708 04/14/24 1547  BP: 124/74 (!) 104/59  Pulse: 76 75  Resp: 16 16  Temp: 97.8 F (36.6 C) 97.8 F (36.6 C)  SpO2: 100% 100%   Examination: Physical Exam:  Constitutional: Chronically ill-appearing  African-American male who appears calm and in  no acute distress but complained a little bit about neck pain Neck: Slightly tender to palpate on the posterior side of the occiput and most tender occipital nerves are Respiratory: Diminished to auscultation bilaterally, no wheezing, rales, rhonchi or crackles. Normal respiratory effort and patient is not tachypenic. No accessory muscle use.  Unlabored breathing Cardiovascular: RRR, no murmurs / rubs / gallops. S1 and S2 auscultated.  There is a left BKA that is wrapped and no wound VAC yet Abdomen: Soft, non-tender, non-distended.  Bowel sounds positive.  GU: Deferred. Musculoskeletal: Left BKA noted.SABRA  Has a left arm AV fistula right IJ TDC Skin: No rashes, lesions, ulcers on a limited skin evaluation. No induration; Warm and dry.  Neurologic: CN 2-12 grossly intact with no focal deficits. Romberg sign and cerebellar reflexes not assessed.  Psychiatric: Normal judgment and insight. Alert and oriented x 3. Normal mood and appropriate affect.   Condition at discharge: stable  The results of significant diagnostics from this hospitalization (including imaging, microbiology, ancillary and laboratory) are listed below for reference.   Imaging Studies: No results found.  Microbiology: Results for orders placed or performed during the hospital encounter of 02/05/24  Body fluid culture w Gram Stain     Status: None   Collection Time: 02/17/24 10:45 AM   Specimen: Body Fluid  Result Value Ref Range Status   Specimen Description FLUID  Final   Special Requests GALLBLADDER  Final   Gram Stain   Final    ABUNDANT WBC PRESENT, PREDOMINANTLY PMN FEW GRAM NEGATIVE RODS Performed at [facility], [address] [place], [place] [address]    Culture   Final    ABUNDANT KLEBSIELLA AEROGENES ABUNDANT CITROBACTER KOSERI    Report Status 02/19/2024 FINAL  Final   Organism ID, Bacteria KLEBSIELLA AEROGENES  Final   Organism ID, Bacteria  CITROBACTER KOSERI  Final      Susceptibility   Citrobacter koseri - MIC*    CEFEPIME  <=0.12 SENSITIVE Sensitive     ERTAPENEM <=0.12 SENSITIVE Sensitive     CEFTRIAXONE  <=0.25 SENSITIVE Sensitive     CIPROFLOXACIN  <=0.06 SENSITIVE Sensitive     GENTAMICIN <=1 SENSITIVE Sensitive     MEROPENEM <=0.25 SENSITIVE Sensitive     TRIMETH/SULFA <=20 SENSITIVE Sensitive     PIP/TAZO Value in next row Sensitive      <=4 SENSITIVEThis is a modified FDA-approved test that has been validated and its performance characteristics determined by the reporting laboratory.  This laboratory is certified under the Clinical Laboratory Improvement Amendments CLIA as qualified to perform high complexity clinical laboratory testing.    * ABUNDANT CITROBACTER KOSERI   Klebsiella aerogenes - MIC*    CEFEPIME  Value in next row Sensitive      <=4 SENSITIVEThis is a modified FDA-approved test that has been validated and its performance characteristics determined by the reporting laboratory.  This laboratory is certified under the Clinical Laboratory Improvement Amendments CLIA as qualified to perform high complexity clinical laboratory testing.    CEFTRIAXONE  Value in next row Resistant      <=4 SENSITIVEThis is a modified FDA-approved test that has been validated and its performance characteristics determined by the reporting laboratory.  This laboratory is certified under the Clinical Laboratory Improvement Amendments CLIA as qualified to perform high complexity clinical laboratory testing.    CIPROFLOXACIN  Value in next row Sensitive      <=4 SENSITIVEThis is a modified FDA-approved test that has been validated and its performance characteristics determined by the  reporting laboratory.  This laboratory is certified under the Clinical Laboratory Improvement Amendments CLIA as qualified to perform high complexity clinical laboratory testing.    GENTAMICIN Value in next row Sensitive      <=4 SENSITIVEThis is a modified  FDA-approved test that has been validated and its performance characteristics determined by the reporting laboratory.  This laboratory is certified under the Clinical Laboratory Improvement Amendments CLIA as qualified to perform high complexity clinical laboratory testing.    MEROPENEM Value in next row Sensitive      <=4 SENSITIVEThis is a modified FDA-approved test that has been validated and its performance characteristics determined by the reporting laboratory.  This laboratory is certified under the Clinical Laboratory Improvement Amendments CLIA as qualified to perform high complexity clinical laboratory testing.    TRIMETH/SULFA Value in next row Sensitive      <=4 SENSITIVEThis is a modified FDA-approved test that has been validated and its performance characteristics determined by the reporting laboratory.  This laboratory is certified under the Clinical Laboratory Improvement Amendments CLIA as qualified to perform high complexity clinical laboratory testing.    PIP/TAZO Value in next row Resistant      >=128 RESISTANTThis is a modified FDA-approved test that has been validated and its performance characteristics determined by the reporting laboratory.  This laboratory is certified under the Clinical Laboratory Improvement Amendments CLIA as qualified to perform high complexity clinical laboratory testing.    * ABUNDANT KLEBSIELLA AEROGENES   Labs: CBC: Recent Labs  Lab 04/12/24 1354 04/12/24 2040 04/13/24 0938 04/14/24 0427  WBC  --  5.7 8.0 5.4  NEUTROABS  --   --  6.4 3.7  HGB 10.5* 10.1* 9.8* 10.2*  HCT 31.0* 31.6* 31.0* 31.9*  MCV  --  98.1 96.9 97.3  PLT  --  108* 134* 118*   Basic Metabolic Panel: Recent Labs  Lab 04/12/24 1354 04/12/24 2040 04/13/24 0407 04/13/24 0938 04/14/24 0427  NA 140  --  139  --  137  K 3.9  --  4.2  --  3.6  CL 100  --  99  --  97*  CO2  --   --  23  --  27  GLUCOSE 94  --  158*  --  113*  BUN 47*  --  49*  --  28*  CREATININE 4.90*  5.23* 5.62*  --  3.83*  CALCIUM   --   --  8.3*  --  8.2*  MG  --   --   --  2.0 1.9  PHOS  --   --   --  6.9* 4.3   Liver Function Tests: Recent Labs  Lab 04/13/24 0938 04/14/24 0427  AST 31 28  ALT 12 8  ALKPHOS 211* 201*  BILITOT 0.7 0.8  PROT 7.5 7.3  ALBUMIN  2.4* 2.4*   CBG: Recent Labs  Lab 04/13/24 0609 04/13/24 2013 04/14/24 0536 04/14/24 1109 04/14/24 1603  GLUCAP 142* 157* 119* 105* 175*   Discharge time spent: greater than 30 minutes.  Signed: Alejandro Marker, DO [facility] 04/14/2024

## 2024-04-14 NOTE — Consult Note (Addendum)
 WOC Nurse Consult Note: Revision with left BKA on 04/12/24.  VAC malfunction and removed by vascular on 04/13/24.  New order for WOC team to apply VAC placed today.   Reason for Consult: Application of NPWT (VAC) therapy to left BKA site Wound type: surgical Pressure Injury POA: NA Measurement: 2.5 cm x 6 cm x 0.3 cm (nonapproximated suture line)  Wound bed:20% fibrin 80% ruddy red Drainage (amount, consistency, odor) minimal serosanguinous   Periwound: Suture line intact otherwise Dressing procedure/placement/frequency: Cleanse wound with NS and pat dry. Barrier ring to periwound to promote seal.  Apply 1 piece black foam.  Cover with drape. Apply TRAC pad. Seal immediately achieved at 125 mmHg .  Change MOnday and Thursday.  Has dialysis Tues/Thurs/Saturday.  Will follow.  Darice Cooley MSN, RN, FNP-BC CWON Wound, Ostomy, Continence Nurse Outpatient [facility] [phone] Work cell phone:  [phone]

## 2024-04-14 NOTE — Plan of Care (Signed)

## 2024-04-14 NOTE — TOC Progression Note (Addendum)
 Transition of Care [facility]) - Progression Note    Patient Details  Name: Phillip [name] MRN: [id number] Date of Birth: 1956-04-23  [facility] of [facility]) CM/SW Contact  Rosalva Jon Bloch, RN Phone Number: 04/14/2024, 11:25 AM  Clinical Narrative:    Consult received for home wound vac. Pt agreeable to DME and home health services. States prior to admit active with home health services but can't remember provider. Referral for wound vac made with [employer], authorization pending. Referral for home health RN services faxed out to various home health agencies, acceptances pending.  04/14/2024 @1649  Authorization received for wound vac. Equipment delivered to pt's room by NCM and delivery form signed by pt.  [facility] with [facility]. [facility] to resume home health services once pt discharges.  IP CM  following and will contiue assisting with needs...  Expected Discharge Plan: Home w Home Health Services Barriers to Discharge: Other (must enter comment) (home wound vac auth pending)               Expected Discharge Plan and Services   Discharge Planning Services: CM Consult                                           Social Drivers of Health (SDOH) Interventions SDOH Screenings   Food Insecurity: No Food Insecurity (04/12/2024)  Housing: Low Risk  (04/12/2024)  Transportation Needs: No Transportation Needs (04/12/2024)  Utilities: Not At Risk (04/12/2024)  Depression (PHQ2-9): Low Risk  (10/22/2018)  Financial Resource Strain: Low Risk (09/26/2020)   Received from [facility]  Physical Activity: Inactive (09/26/2020)   Received from [facility]  Social Connections: Unknown (04/12/2024)  Stress: No Stress Concern Present (09/26/2020)   [facility] from [facility], [facility], [facility]  Tobacco Use: Medium Risk (04/12/2024)  Health Literacy: Low Risk (09/26/2020)   Received from [facility]    Readmission Risk Interventions    02/05/2024    1:08 PM  08/13/2023    4:28 PM  Readmission Risk Prevention Plan  Transportation Screening Complete Complete  HRI or Home Care Consult  Complete  Palliative Care Screening  Not Applicable  Medication Review (RN Care Manager) Complete Complete  HRI or Home Care Consult Complete   SW Recovery Care/Counseling Consult Complete   Palliative Care Screening Not Applicable   Skilled Nursing Facility Not Applicable

## 2024-04-14 NOTE — Progress Notes (Addendum)
  Progress Note    04/14/2024 8:31 AM 2 Days Post-Op  Subjective:  no complaints    Vitals:   04/14/24 0417 04/14/24 0708  BP: 111/72 124/74  Pulse: 76 76  Resp: 18 16  Temp: 98 F (36.7 C) 97.8 F (36.6 C)  SpO2: 100% 100%    Physical Exam: General:  resting comfortably, NAD Cardiac:  regular Lungs:  nonlabored Extremities:  L BKA wound healthy appearing with minimal serosanguineous output  CBC    Component Value Date/Time   WBC 5.4 04/14/2024 0427   RBC 3.28 (L) 04/14/2024 0427   RBC 3.21 (L) 04/14/2024 0427   HGB 10.2 (L) 04/14/2024 0427   HGB 11.5 (L) 05/16/2023 1429   HCT 31.9 (L) 04/14/2024 0427   HCT 35.3 (L) 05/16/2023 1429   PLT 118 (L) 04/14/2024 0427   PLT 144 (L) 05/16/2023 1429   MCV 97.3 04/14/2024 0427   MCV 90 05/16/2023 1429   MCH 31.1 04/14/2024 0427   MCHC 32.0 04/14/2024 0427   RDW 14.5 04/14/2024 0427   RDW 13.5 05/16/2023 1429   LYMPHSABS 1.0 04/14/2024 0427   LYMPHSABS 0.7 05/16/2023 1429   MONOABS 0.5 04/14/2024 0427   EOSABS 0.2 04/14/2024 0427   EOSABS 0.1 05/16/2023 1429   BASOSABS 0.1 04/14/2024 0427   BASOSABS 0.0 05/16/2023 1429    BMET    Component Value Date/Time   NA 137 04/14/2024 0427   NA 145 (H) 07/14/2023 1412   NA 136 08/30/2013 1025   K 3.6 04/14/2024 0427   K 3.7 08/30/2013 1025   CL 97 (L) 04/14/2024 0427   CL 101 08/30/2013 1025   CO2 27 04/14/2024 0427   CO2 31 08/30/2013 1025   GLUCOSE 113 (H) 04/14/2024 0427   GLUCOSE 310 (H) 08/30/2013 1025   BUN 28 (H) 04/14/2024 0427   BUN 97 (HH) 07/14/2023 1412   BUN 17 08/30/2013 1025   CREATININE 3.83 (H) 04/14/2024 0427   CREATININE 1.15 08/30/2013 1025   CALCIUM  8.2 (L) 04/14/2024 0427   CALCIUM  9.4 08/30/2013 1025   GFRNONAA 16 (L) 04/14/2024 0427   GFRNONAA >60 08/30/2013 1025   GFRAA 27 (L) 02/22/2020 0539   GFRAA >60 08/30/2013 1025    INR    Component Value Date/Time   INR 1.1 02/09/2024 0515     Intake/Output Summary (Last 24 hours)  at 04/14/2024 0831 Last data filed at 04/14/2024 0800 Gross per 24 hour  Intake 120 ml  Output 2417 ml  Net -2297 ml      Assessment/Plan:  68 y.o. male is 2 days postop, s/p: Left BKA revision   - He is doing well this morning without any complaints.  He denies any pain at his amputation site -Left BKA with healthy tissue in the wound bed.  Dressing overnight with minimal serosanguineous drainage -Will consult WOC for replacement of wound vac today.  He will need a home VAC and home health RN wound VAC changes 2 times weekly   Ahmed Holster, PA-C [facility] [phone] 04/14/2024 8:31 AM  VASCULAR STAFF ADDENDUM:  Appreciate wound involvement with his VAC.   Patient will follow-up as an outpatient with our office.  Fonda FORBES Rim MD [name] Phone Number: [phone] 04/14/2024 5:19 PM

## 2024-04-14 NOTE — Progress Notes (Signed)
  [facility] Progress Note   Subjective:   Seen in room - feeling ok today. Dialyzes yesterday without issues. No CP/dyspnea. Rn reports he is waiting on insurance authorization for home health/wound vac services.  Objective Vitals:   04/13/24 1806 04/13/24 1953 04/14/24 0417 04/14/24 0708  BP: 120/65 (!) 116/57 111/72 124/74  Pulse: 77 77 76 76  Resp: 16 18 18 16   Temp: 97.6 F (36.4 C) 98 F (36.7 C) 98 F (36.7 C) 97.8 F (36.6 C)  TempSrc:  Oral  Oral  SpO2: 99% 100% 100% 100%  Weight:      Height:       Physical Exam General: Well appearing, NAD. Room air. Heart: RRR Lungs: CTAB Abdomen: soft Extremities: no RLE edema, L BKA bandaged Dialysis Access: TDC in R chest and LUE AVF  Additional Objective Labs: Basic Metabolic Panel: Recent Labs  Lab 04/12/24 1354 04/12/24 2040 04/13/24 0407 04/13/24 0938 04/14/24 0427  NA 140  --  139  --  137  K 3.9  --  4.2  --  3.6  CL 100  --  99  --  97*  CO2  --   --  23  --  27  GLUCOSE 94  --  158*  --  113*  BUN 47*  --  49*  --  28*  CREATININE 4.90* 5.23* 5.62*  --  3.83*  CALCIUM   --   --  8.3*  --  8.2*  PHOS  --   --   --  6.9* 4.3   Liver Function Tests: Recent Labs  Lab 04/13/24 0938 04/14/24 0427  AST 31 28  ALT 12 8  ALKPHOS 211* 201*  BILITOT 0.7 0.8  PROT 7.5 7.3  ALBUMIN  2.4* 2.4*   CBC: Recent Labs  Lab 04/12/24 2040 04/13/24 0938 04/14/24 0427  WBC 5.7 8.0 5.4  NEUTROABS  --  6.4 3.7  HGB 10.1* 9.8* 10.2*  HCT 31.6* 31.0* 31.9*  MCV 98.1 96.9 97.3  PLT 108* 134* 118*   Medications:   atorvastatin   20 mg Oral Daily   carvedilol   6.25 mg Oral BID WC   Chlorhexidine  Gluconate Cloth  6 each Topical Q0600   enoxaparin  (LOVENOX ) injection  30 mg Subcutaneous Q24H   gabapentin   300 mg Oral TID   insulin  aspart  0-5 Units Subcutaneous QHS   insulin  aspart  0-6 Units Subcutaneous TID WC   levothyroxine   50 mcg Oral Q0600   lidocaine   1 patch Transdermal Q24H   sevelamer   carbonate  800 mg Oral TID WC    Dialysis Orders TTS - [place] 4hr, BFR 350, L AVF + R TDC, no heparin   Assessment/Plan: L BKA with non-healing incision: S/p debridement/revision with VVS on 11/10. Wound vac ordered, auth pending. ESRD: Continue HD on TTS schedule for now - next HD tomorrow. HTN/volume: BP controlled, no edema - low UFG as tolerated. Anemia of ESRD: Hgb 10.2 - follow without ESA for now. Secondary HPTH: Ca/Phos ok - continue home binders. Nutrition: Alb low, adding protein supplements Hx TAVR   Izetta Boehringer, PA-C 04/14/2024, 12:23 PM  [facility]

## 2024-04-14 NOTE — Evaluation (Signed)
 Physical Therapy Evaluation Patient Details Name: Phillip [name] MRN: [id number] DOB: August 12, 1955 Today's Date: 04/14/2024  History of Present Illness  68 y.o. male presents to [facility] 04/12/24 as a triage visit following L BKA on 01/30/24 as the area of incision dehiscence has gotten much larger. S/p Irrigation and debridement and wound vac placement of L BKA 11/10. PMH: TAVR (8/19), osteoarthritis, HTN, ESRD on dialysis, DMII, HLD, CAD, severe aortic stenosis, acute cholecystitis requiring cholecystostomy drain 6/25.   Clinical Impression  Pt admitted with above diagnosis. PTA, pt mobilize using a manual w/c which he could propel himself. He reports transferring via lateral scoot with assist. Pt lives with his wife in a mobile home with a ramped entrance. She provides 24/7 supervision and assist. Pt currently with functional limitations due to the deficits listed below (see PT Problem List). He required CGA for bed<>chair lateral scoot transfer. Pt attempted sit<>stand using RW three times with mod-maxA. He was unable to achieve static stance d/t significant posterior lean. Pt will benefit from acute skilled PT to increase his independence and safety with mobility to allow discharge. Recommend continued HHPT.       If plan is discharge home, recommend the following: A little help with walking and/or transfers;A little help with bathing/dressing/bathroom;Assistance with cooking/housework;Assist for transportation;Help with stairs or ramp for entrance   Can travel by private vehicle        Equipment Recommendations None recommended by PT (pt already has DME)  Recommendations for Other Services       Functional Status Assessment Patient has had a recent decline in their functional status and demonstrates the ability to make significant improvements in function in a reasonable and predictable amount of time.     Precautions / Restrictions Precautions Precautions: Fall Recall of  Precautions/Restrictions: Impaired Precaution/Restrictions Comments: Posterior Bias when attempting to stand Restrictions Weight Bearing Restrictions Per Provider Order: No      Mobility  Bed Mobility               General bed mobility comments: Not assessed. Pt greeted in recliner chair at start of session and returned there at end of session.    Transfers Overall transfer level: Needs assistance Equipment used: Rolling walker (2 wheels) Transfers: Sit to/from Stand, Bed to chair/wheelchair/BSC Sit to Stand: Mod assist, Max assist          Lateral/Scoot Transfers: Contact guard assist General transfer comment: Pt attempted sit<>stand from recliner chair 3 times. Cued proper hand/foot placement and sequencing. He powered up with modA, but demonstrated a significant posterior lean unable to achieve full upright erect static stance despite maxA. He transferred chair<>bed via lateral scoot over drop arm. Educated pt on head-hips relationship. CGA for safety.    Ambulation/Gait               General Gait Details: NT - pt mobilizes using manual w/c at home.  Stairs            Wheelchair Mobility     Tilt Bed    Modified Rankin (Stroke Patients Only)       Balance Overall balance assessment: Needs assistance Sitting-balance support: No upper extremity supported, Feet supported Sitting balance-Leahy Scale: Fair   Postural control: Posterior lean Standing balance support: Bilateral upper extremity supported, During functional activity, Reliant on assistive device for balance Standing balance-Leahy Scale: Zero Standing balance comment: Pt unable to maintain static stance using RW with maxA, significant posterior lean.  Pertinent Vitals/Pain Pain Assessment Pain Assessment: Faces Faces Pain Scale: Hurts a little bit Pain Location: L residual limb Pain Descriptors / Indicators: Discomfort, Aching, Sore Pain  Intervention(s): Monitored during session, Limited activity within patient's tolerance, Repositioned    Home Living Family/patient expects to be discharged to:: Private residence Living Arrangements: Spouse/significant other Available Help at Discharge: Family;Available 24 hours/day Type of Home: Mobile home Home Access: Ramped entrance       Home Layout: One level Home Equipment: BSC/3in1;Wheelchair - manual;Shower seat Additional Comments: Pt reports he is actively recieving HH PT and HH OT.    Prior Function Prior Level of Function : Needs assist       Physical Assist : Mobility (physical);ADLs (physical) Mobility (physical): Transfers ADLs (physical): Dressing;IADLs;Toileting Mobility Comments: Receives assistance to get into wheelchair from wife. Pt reports he primarily laterally scoots in order to transfer. Denies fall hx. ADLs Comments: Independent with ADLs prior to amputation. Reports receiving assistance in dressing, toileting. States independent with grooming and sponge bathing.     Extremity/Trunk Assessment   Upper Extremity Assessment Upper Extremity Assessment: Defer to OT evaluation    Lower Extremity Assessment Lower Extremity Assessment: LLE deficits/detail LLE Deficits / Details: Pt POD 2 s/p I&D of L BKA. Decreased knee AROM d/t pain. Grossly 3/5 strength. LLE Sensation: decreased proprioception LLE Coordination: decreased gross motor    Cervical / Trunk Assessment Cervical / Trunk Assessment: Normal  Communication   Communication Communication: Impaired Factors Affecting Communication: Reduced clarity of speech    Cognition Arousal: Alert Behavior During Therapy: WFL for tasks assessed/performed   PT - Cognitive impairments: No apparent impairments                       PT - Cognition Comments: Pt A,Ox4 Following commands: Intact       Cueing Cueing Techniques: Verbal cues, Visual cues, Tactile cues     General Comments  General comments (skin integrity, edema, etc.): VSS on RA    Exercises     Assessment/Plan    PT Assessment Patient needs continued PT services  PT Problem List Decreased strength;Decreased range of motion;Decreased activity tolerance;Decreased balance;Decreased mobility;Decreased knowledge of use of DME;Decreased safety awareness;Pain       PT Treatment Interventions DME instruction;Functional mobility training;Therapeutic activities;Therapeutic exercise;Balance training;Patient/family education;Wheelchair mobility training;Gait training    PT Goals (Current goals can be found in the Care Plan section)  Acute Rehab PT Goals Patient Stated Goal: Return Home and continue therapy1 PT Goal Formulation: With patient Time For Goal Achievement: 04/28/24 Potential to Achieve Goals: Good    Frequency Min 2X/week     Co-evaluation               AM-PAC PT 6 Clicks Mobility  Outcome Measure Help needed turning from your back to your side while in a flat bed without using bedrails?: A Little Help needed moving from lying on your back to sitting on the side of a flat bed without using bedrails?: A Little Help needed moving to and from a bed to a chair (including a wheelchair)?: A Little Help needed standing up from a chair using your arms (e.g., wheelchair or bedside chair)?: Total Help needed to walk in hospital room?: Total Help needed climbing 3-5 steps with a railing? : Total 6 Click Score: 12    End of Session Equipment Utilized During Treatment: Gait belt Activity Tolerance: Patient tolerated treatment well Patient left: in chair;with call bell/phone within reach;with chair alarm  set Nurse Communication: Mobility status PT Visit Diagnosis: Muscle weakness (generalized) (M62.81);Other abnormalities of gait and mobility (R26.89);Unsteadiness on feet (R26.81)    Time: 8891-8877 PT Time Calculation (min) (ACUTE ONLY): 14 min   Charges:   PT Evaluation $PT Eval Moderate  Complexity: 1 Mod   PT General Charges $$ ACUTE PT VISIT: 1 Visit         Randall SAUNDERS, PT, DPT Acute Rehabilitation Services Office: [phone] Secure Chat Preferred  Phillip [name] 04/14/2024, 1:41 PM

## 2024-04-15 ENCOUNTER — Other Ambulatory Visit: Payer: Self-pay

## 2024-04-15 LAB — CBC
HCT: 29.7 % — ABNORMAL LOW (ref 39.0–52.0)
Hemoglobin: 9.4 g/dL — ABNORMAL LOW (ref 13.0–17.0)
MCH: 30.9 pg (ref 26.0–34.0)
MCHC: 31.6 g/dL (ref 30.0–36.0)
MCV: 97.7 fL (ref 80.0–100.0)
Platelets: 118 K/uL — ABNORMAL LOW (ref 150–400)
RBC: 3.04 MIL/uL — ABNORMAL LOW (ref 4.22–5.81)
RDW: 14.3 % (ref 11.5–15.5)
WBC: 7.1 K/uL (ref 4.0–10.5)
nRBC: 0 % (ref 0.0–0.2)

## 2024-04-15 LAB — RENAL FUNCTION PANEL
Albumin: 2.3 g/dL — ABNORMAL LOW (ref 3.5–5.0)
Anion gap: 15 (ref 5–15)
BUN: 50 mg/dL — ABNORMAL HIGH (ref 8–23)
CO2: 25 mmol/L (ref 22–32)
Calcium: 8.1 mg/dL — ABNORMAL LOW (ref 8.9–10.3)
Chloride: 93 mmol/L — ABNORMAL LOW (ref 98–111)
Creatinine, Ser: 5.25 mg/dL — ABNORMAL HIGH (ref 0.61–1.24)
GFR, Estimated: 11 mL/min — ABNORMAL LOW (ref >60)
Glucose, Bld: 96 mg/dL (ref 70–99)
Phosphorus: 4.3 mg/dL (ref 2.5–4.6)
Potassium: 4.1 mmol/L (ref 3.5–5.1)
Sodium: 133 mmol/L — ABNORMAL LOW (ref 135–145)

## 2024-04-15 LAB — GLUCOSE, CAPILLARY
Glucose-Capillary: 83 mg/dL (ref 70–99)
Glucose-Capillary: 106 mg/dL — ABNORMAL HIGH (ref 70–99)

## 2024-04-15 MED ORDER — HEPARIN SODIUM (PORCINE) 1000 UNIT/ML IJ SOLN
3200.0000 [IU] | Freq: Once | INTRAMUSCULAR | Status: AC
  Administered 2024-04-15: 3200 [IU]

## 2024-04-15 MED ORDER — OXYCODONE HCL 5 MG PO TABS
ORAL_TABLET | ORAL | Status: AC
  Filled 2024-04-15: qty 1

## 2024-04-15 MED ORDER — ACETAMINOPHEN 325 MG PO TABS
ORAL_TABLET | ORAL | Status: AC
  Filled 2024-04-15: qty 2

## 2024-04-15 MED ORDER — HEPARIN SODIUM (PORCINE) 1000 UNIT/ML IJ SOLN
INTRAMUSCULAR | Status: AC
  Filled 2024-04-15: qty 4

## 2024-04-15 NOTE — Progress Notes (Signed)
 Patient seen during hemodialysis session. No changes to management or recommendations in previously written discharge summary. Patient stable for discharge home with home health services.  Elgin Lam, MD [facility]

## 2024-04-15 NOTE — Consult Note (Signed)
 WOC Nurse wound follow up Post dialysis.  Patient is up to chair.  Disconnected from hospital unit and connected to home unit. Immediate seal at 125 mmHg.  Will change Monday and Thursday.  Since applied Wednesday, Home Health [facility]) will change tomorrow.  Wife is teachable caregiver and willing to learn how to remove old dressing and apply NS moist gauze.  She understands that if device alarms and therapy is off for more than an hour, she will need to remove the dressing and apply NS moist gauze.  HH nurse demonstrate this at tomorrow's visit.  Wound type: surgical  Measurement: see 04/14/24 note  Dressing procedure/placement/frequency: HH supplies in room.  Added 2 barrier rings to supply bag as I used this to achieve a secure seal on a rounded surface.  Will not follow at this time.  Please re-consult if needed.  Darice Cooley MSN, RN, FNP-BC CWON Wound, Ostomy, Continence Nurse Outpatient [facility] [phone] Work cell phone:  [phone]

## 2024-04-15 NOTE — TOC Progression Note (Addendum)
 Transition of Care (TOC) - Progression Note   [name] with [facility] notified patient discharge order is in. Per note yesterday home VAC at bedside .   Heard back from [place] with [facility] :  [facility] , cannot see patient until Monday . VAC changes Monday and Thursday. They want to be sure VAC is changed today. NCM secure chatted MD, bedside nurse and WOC   Discussed with WOC and [name] with [employer]. [facility] can now see patient at home tomorrow and provide wife with education  Patient Details  Name: Phillip [name] MRN: [id number] Date of Birth: November 07, 1955  Transition of Care [facility]) CM/SW Contact  Stephane Powell Jansky, RN Phone Number: 04/15/2024, 12:28 PM  Clinical Narrative:       Expected Discharge Plan: Home w Home Health Services Barriers to Discharge: Other (must enter comment) (home wound vac auth pending)               Expected Discharge Plan and Services   Discharge Planning Services: CM Consult     Expected Discharge Date: 04/14/24                         HH Arranged: RN HH Agency: [facility] Date [facility] Agency Contacted: 04/14/24 Time HH Agency Contacted: 1224 Representative spoke with at [facility] Agency: Darleene   Social Drivers of Health (SDOH) Interventions SDOH Screenings   Food Insecurity: No Food Insecurity (04/12/2024)  Housing: Low Risk  (04/12/2024)  Transportation Needs: No Transportation Needs (04/12/2024)  Utilities: Not At Risk (04/12/2024)  Depression (PHQ2-9): Low Risk  (10/22/2018)  Financial Resource Strain: Low Risk (09/26/2020)   Received from [facility]  Physical Activity: Inactive (09/26/2020)   Received from [facility]  Social Connections: Unknown (04/12/2024)  Stress: No Stress Concern Present (09/26/2020)   Received from [facility]  Tobacco Use: Medium Risk (04/12/2024)  Health Literacy: Low Risk (09/26/2020)   Received from [facility]    Readmission Risk Interventions    02/05/2024     1:08 PM 08/13/2023    4:28 PM  Readmission Risk Prevention Plan  Transportation Screening Complete Complete  HRI or Home Care Consult  Complete  Palliative Care Screening  Not Applicable  Medication Review (RN Care Manager) Complete Complete  HRI or Home Care Consult Complete   SW Recovery Care/Counseling Consult Complete   Palliative Care Screening Not Applicable   Skilled Nursing Facility Not Applicable

## 2024-04-15 NOTE — Progress Notes (Signed)
  [facility] Progress Note   Subjective:   Seen in KDU - on dialysis with 1.5L UFG and tolerating, using TDC. No CP/dyspnea.  Objective Vitals:   04/15/24 0830 04/15/24 0900 04/15/24 0930 04/15/24 1000  BP: 113/66 112/63 109/60 123/66  Pulse: 73 70 71 74  Resp: 14 13 13 13   Temp:      TempSrc:      SpO2: 98% 100% 98% 99%  Weight:      Height:       Physical Exam General: Well appearing, NAD. Room air. Heart: RRR Lungs: CTAB Abdomen: soft Extremities: no RLE edema, L BKA bandaged with wound vac in place Dialysis Access: [facility] in R chest and LUE AVF  Additional Objective Labs: Basic Metabolic Panel: Recent Labs  Lab 04/13/24 0407 04/13/24 0938 04/14/24 0427 04/15/24 0753  NA 139  --  137 133*  K 4.2  --  3.6 4.1  CL 99  --  97* 93*  CO2 23  --  27 25  GLUCOSE 158*  --  113* 96  BUN 49*  --  28* 50*  CREATININE 5.62*  --  3.83* 5.25*  CALCIUM  8.3*  --  8.2* 8.1*  PHOS  --  6.9* 4.3 4.3   Liver Function Tests: Recent Labs  Lab 04/13/24 0938 04/14/24 0427 04/15/24 0753  AST 31 28  --   ALT 12 8  --   ALKPHOS 211* 201*  --   BILITOT 0.7 0.8  --   PROT 7.5 7.3  --   ALBUMIN  2.4* 2.4* 2.3*   CBC: Recent Labs  Lab 04/12/24 2040 04/13/24 0938 04/14/24 0427 04/15/24 0753  WBC 5.7 8.0 5.4 7.1  NEUTROABS  --  6.4 3.7  --   HGB 10.1* 9.8* 10.2* 9.4*  HCT 31.6* 31.0* 31.9* 29.7*  MCV 98.1 96.9 97.3 97.7  PLT 108* 134* 118* 118*   Medications:   aspirin  EC  81 mg Oral Daily   atorvastatin   20 mg Oral Daily   carvedilol   6.25 mg Oral BID WC   Chlorhexidine  Gluconate Cloth  6 each Topical Q0600   enoxaparin  (LOVENOX ) injection  30 mg Subcutaneous Q24H   gabapentin   300 mg Oral TID   insulin  aspart  0-5 Units Subcutaneous QHS   insulin  aspart  0-6 Units Subcutaneous TID WC   levothyroxine   50 mcg Oral Q0600   lidocaine   1 patch Transdermal Q24H   sevelamer  carbonate  800 mg Oral TID WC    Dialysis Orders TTS - [place] 4hr, BFR 350,  L AVF + R TDC, no heparin    Assessment/Plan: L BKA with non-healing incision: S/p debridement/revision with VVS on 11/10. Wound vac in place. ESRD: Continue HD on TTS schedule for now - HD now, 1.5L UFG. HTN/volume: BP controlled, no edema. Anemia of ESRD: Hgb 9.4 - resume ESA as outpatient. Secondary HPTH: Ca/Phos ok - continue home binders. Nutrition: Alb low, continue protein supplements Hx TAVR Dispo: Ok from renal standpoint once medically cleared.   Phillip Boehringer, PA-C 04/15/2024, 10:18 AM  [employer]

## 2024-04-15 NOTE — Progress Notes (Signed)
 Received patient in bed to unit.  Alert and oriented.  Informed consent signed and in chart.   TX duration:3.5 hours  Patient tolerated well.  Transported back to the room  Alert, without acute distress.  Hand-off given to patient's nurse.   Access used: Right HD internal jugular Cath, patient wanted to use catheter Access issues: none  Total UF removed: 1.5L Medication(s) given: oxycodone  and tylenol    04/15/24 1158  Vitals  Temp 98.6 F (37 C)  Temp Source Oral  BP 99/73  MAP (mmHg) 82  Pulse Rate 78  ECG Heart Rate 76  Resp 17  Weight 69.4 kg  Type of Weight Post-Dialysis  Oxygen Therapy  SpO2 100 %  O2 Device Room Air  During Treatment Monitoring  HD Safety Checks Performed Yes  Intra-Hemodialysis Comments Tx completed  Post Treatment  Dialyzer Clearance Clear  Liters Processed 84  Fluid Removed (mL) 1500 mL  Tolerated HD Treatment Yes  Hemodialysis Catheter Right Internal jugular Double lumen Permanent (Tunneled)  Placement Date/Time: 02/23/24 1217   Serial / Lot #: [id number]  Expiration Date: 08/31/28  Time Out: Correct patient;Correct site;Correct procedure  Maximum sterile barrier precautions: Hand hygiene;Sterile probe cover;Cap;Mask;Sterile gown;Sterile gl...  Site Condition No complications  Blue Lumen Status Antimicrobial dead end cap;Dead end cap in place  Red Lumen Status Antimicrobial dead end cap;Heparin  locked;Flushed  Purple Lumen Status N/A  Catheter fill solution Heparin  1000 units/ml  Catheter fill volume (Arterial) 1.6 cc  Catheter fill volume (Venous) 1.6  Dressing Type Transparent  Dressing Status Antimicrobial disc/dressing in place;Clean, Dry, Intact  Drainage Description None  Dressing Change Due 04/20/24     Camellia Brasil LPN [facility] D[facility]

## 2024-04-15 NOTE — Progress Notes (Signed)
 OT Cancellation Note  Patient Details Name: Phillip [name] MRN: [id number] DOB: [birth year]   Cancelled Treatment:    Reason Eval/Treat Not Completed: Patient at procedure or test/ unavailable Pt off floor for HD. OT to follow-up as appropriate and schedule allows.   Maurilio CROME, OTR/LSABRA  [facility] Acute Rehabilitation  Office: [phone]   Maurilio PARAS Phiona Ramnauth 04/15/2024, 8:41 AM

## 2024-04-16 ENCOUNTER — Inpatient Hospital Stay (HOSPITAL_COMMUNITY): Admission: RE | Admit: 2024-04-16 | Source: Ambulatory Visit

## 2024-04-16 NOTE — Progress Notes (Signed)
 Late note entry 11/14 0858 am  D/c noted. Contacted out-pt HD clinic, [facility], to inform of d/c and anticipated arrival Saturday to resume out-pt tx. D/c summary and last nephrology note faxed over at this time. No further support needed.   [facility] Adal Sereno Dialysis Naviagtor [phone]

## 2024-04-19 NOTE — Progress Notes (Signed)
 Late note entry 11/17 1146  Contacted by [facility] stating they never received d/c summary. Have faxed it over a second time. No further support needed.   Hollyn Stucky Dialysis Navigator

## 2024-04-20 ENCOUNTER — Telehealth (HOSPITAL_COMMUNITY): Payer: Self-pay | Admitting: Pharmacy Technician

## 2024-04-20 ENCOUNTER — Other Ambulatory Visit (HOSPITAL_COMMUNITY): Payer: Self-pay

## 2024-04-20 NOTE — Telephone Encounter (Signed)
Clinical Notes have been submitted

## 2024-04-20 NOTE — Telephone Encounter (Signed)
 Pharmacy Patient Advocate Encounter   Received notification from Fax that prior authorization for Lidocaine  5% Patches is required/requested.   Insurance verification completed.   The patient is insured through [facility] [employer].   Per test claim: PA required; PA started via CoverMyMeds. KEY [id number] . Waiting for clinical questions to populate.

## 2024-04-21 NOTE — Telephone Encounter (Signed)
 Pharmacy Patient Advocate Encounter  Received notification from [employer] that Prior Authorization for Lidocaine  5% Patches has been DENIED.  Full denial letter will be uploaded to the media tab. See denial reason below.   PA #/Case ID/Reference #: [id number]

## 2024-05-01 ENCOUNTER — Inpatient Hospital Stay

## 2024-05-01 ENCOUNTER — Other Ambulatory Visit: Payer: Self-pay

## 2024-05-01 ENCOUNTER — Emergency Department

## 2024-05-01 ENCOUNTER — Inpatient Hospital Stay
Admission: EM | Admit: 2024-05-01 | Discharge: 2024-06-03 | DRG: 871 | Disposition: E | Attending: Internal Medicine | Admitting: Internal Medicine

## 2024-05-01 DIAGNOSIS — Z7902 Long term (current) use of antithrombotics/antiplatelets: Secondary | ICD-10-CM | POA: Diagnosis not present

## 2024-05-01 DIAGNOSIS — E43 Unspecified severe protein-calorie malnutrition: Secondary | ICD-10-CM | POA: Diagnosis present

## 2024-05-01 DIAGNOSIS — I10 Essential (primary) hypertension: Secondary | ICD-10-CM | POA: Diagnosis present

## 2024-05-01 DIAGNOSIS — E1169 Type 2 diabetes mellitus with other specified complication: Secondary | ICD-10-CM | POA: Diagnosis present

## 2024-05-01 DIAGNOSIS — R531 Weakness: Secondary | ICD-10-CM

## 2024-05-01 DIAGNOSIS — R7989 Other specified abnormal findings of blood chemistry: Secondary | ICD-10-CM | POA: Insufficient documentation

## 2024-05-01 DIAGNOSIS — K810 Acute cholecystitis: Principal | ICD-10-CM

## 2024-05-01 DIAGNOSIS — I2510 Atherosclerotic heart disease of native coronary artery without angina pectoris: Secondary | ICD-10-CM | POA: Diagnosis present

## 2024-05-01 DIAGNOSIS — N186 End stage renal disease: Secondary | ICD-10-CM | POA: Diagnosis present

## 2024-05-01 DIAGNOSIS — Z9889 Other specified postprocedural states: Secondary | ICD-10-CM

## 2024-05-01 DIAGNOSIS — D631 Anemia in chronic kidney disease: Secondary | ICD-10-CM | POA: Diagnosis present

## 2024-05-01 DIAGNOSIS — E1122 Type 2 diabetes mellitus with diabetic chronic kidney disease: Secondary | ICD-10-CM | POA: Diagnosis present

## 2024-05-01 DIAGNOSIS — E1151 Type 2 diabetes mellitus with diabetic peripheral angiopathy without gangrene: Secondary | ICD-10-CM | POA: Diagnosis present

## 2024-05-01 DIAGNOSIS — D696 Thrombocytopenia, unspecified: Secondary | ICD-10-CM | POA: Diagnosis present

## 2024-05-01 DIAGNOSIS — N1330 Unspecified hydronephrosis: Secondary | ICD-10-CM | POA: Diagnosis present

## 2024-05-01 DIAGNOSIS — Z794 Long term (current) use of insulin: Secondary | ICD-10-CM | POA: Diagnosis not present

## 2024-05-01 DIAGNOSIS — E039 Hypothyroidism, unspecified: Secondary | ICD-10-CM | POA: Diagnosis present

## 2024-05-01 DIAGNOSIS — Z89512 Acquired absence of left leg below knee: Secondary | ICD-10-CM | POA: Diagnosis not present

## 2024-05-01 DIAGNOSIS — I7025 Atherosclerosis of native arteries of other extremities with ulceration: Secondary | ICD-10-CM | POA: Diagnosis present

## 2024-05-01 DIAGNOSIS — A419 Sepsis, unspecified organism: Secondary | ICD-10-CM | POA: Diagnosis present

## 2024-05-01 DIAGNOSIS — R6510 Systemic inflammatory response syndrome (SIRS) of non-infectious origin without acute organ dysfunction: Secondary | ICD-10-CM | POA: Diagnosis present

## 2024-05-01 DIAGNOSIS — I272 Pulmonary hypertension, unspecified: Secondary | ICD-10-CM | POA: Diagnosis present

## 2024-05-01 DIAGNOSIS — N400 Enlarged prostate without lower urinary tract symptoms: Secondary | ICD-10-CM | POA: Diagnosis present

## 2024-05-01 DIAGNOSIS — E119 Type 2 diabetes mellitus without complications: Secondary | ICD-10-CM

## 2024-05-01 DIAGNOSIS — Z7982 Long term (current) use of aspirin: Secondary | ICD-10-CM | POA: Diagnosis not present

## 2024-05-01 DIAGNOSIS — Z992 Dependence on renal dialysis: Secondary | ICD-10-CM | POA: Diagnosis not present

## 2024-05-01 DIAGNOSIS — E1142 Type 2 diabetes mellitus with diabetic polyneuropathy: Secondary | ICD-10-CM | POA: Diagnosis present

## 2024-05-01 DIAGNOSIS — R509 Fever, unspecified: Secondary | ICD-10-CM | POA: Diagnosis not present

## 2024-05-01 DIAGNOSIS — R109 Unspecified abdominal pain: Secondary | ICD-10-CM | POA: Diagnosis not present

## 2024-05-01 DIAGNOSIS — I5022 Chronic systolic (congestive) heart failure: Secondary | ICD-10-CM | POA: Insufficient documentation

## 2024-05-01 DIAGNOSIS — Z79899 Other long term (current) drug therapy: Secondary | ICD-10-CM | POA: Diagnosis not present

## 2024-05-01 DIAGNOSIS — Z87891 Personal history of nicotine dependence: Secondary | ICD-10-CM | POA: Diagnosis not present

## 2024-05-01 DIAGNOSIS — N2581 Secondary hyperparathyroidism of renal origin: Secondary | ICD-10-CM | POA: Diagnosis present

## 2024-05-01 DIAGNOSIS — I132 Hypertensive heart and chronic kidney disease with heart failure and with stage 5 chronic kidney disease, or end stage renal disease: Secondary | ICD-10-CM | POA: Diagnosis present

## 2024-05-01 DIAGNOSIS — R1011 Right upper quadrant pain: Principal | ICD-10-CM

## 2024-05-01 DIAGNOSIS — J9690 Respiratory failure, unspecified, unspecified whether with hypoxia or hypercapnia: Secondary | ICD-10-CM | POA: Diagnosis not present

## 2024-05-01 DIAGNOSIS — Z952 Presence of prosthetic heart valve: Secondary | ICD-10-CM

## 2024-05-01 DIAGNOSIS — Z953 Presence of xenogenic heart valve: Secondary | ICD-10-CM | POA: Diagnosis not present

## 2024-05-01 DIAGNOSIS — I5042 Chronic combined systolic (congestive) and diastolic (congestive) heart failure: Secondary | ICD-10-CM | POA: Diagnosis present

## 2024-05-01 DIAGNOSIS — I469 Cardiac arrest, cause unspecified: Secondary | ICD-10-CM | POA: Diagnosis not present

## 2024-05-01 LAB — CBC WITH DIFFERENTIAL/PLATELET
Abs Immature Granulocytes: 0.05 K/uL (ref 0.00–0.07)
Basophils Absolute: 0 K/uL (ref 0.0–0.1)
Basophils Relative: 1 %
Eosinophils Absolute: 0.1 K/uL (ref 0.0–0.5)
Eosinophils Relative: 1 %
HCT: 29.1 % — ABNORMAL LOW (ref 39.0–52.0)
Hemoglobin: 9 g/dL — ABNORMAL LOW (ref 13.0–17.0)
Immature Granulocytes: 1 %
Lymphocytes Relative: 8 %
Lymphs Abs: 0.7 K/uL (ref 0.7–4.0)
MCH: 30.7 pg (ref 26.0–34.0)
MCHC: 30.9 g/dL (ref 30.0–36.0)
MCV: 99.3 fL (ref 80.0–100.0)
Monocytes Absolute: 0.6 K/uL (ref 0.1–1.0)
Monocytes Relative: 7 %
Neutro Abs: 7.3 K/uL (ref 1.7–7.7)
Neutrophils Relative %: 82 %
Platelets: 126 K/uL — ABNORMAL LOW (ref 150–400)
RBC: 2.93 MIL/uL — ABNORMAL LOW (ref 4.22–5.81)
RDW: 14.2 % (ref 11.5–15.5)
WBC: 8.8 K/uL (ref 4.0–10.5)
nRBC: 0 % (ref 0.0–0.2)

## 2024-05-01 LAB — COMPREHENSIVE METABOLIC PANEL WITH GFR
ALT: 19 U/L (ref 0–44)
AST: 39 U/L (ref 15–41)
Albumin: 3.4 g/dL — ABNORMAL LOW (ref 3.5–5.0)
Alkaline Phosphatase: 238 U/L — ABNORMAL HIGH (ref 38–126)
Anion gap: 12 (ref 5–15)
BUN: 32 mg/dL — ABNORMAL HIGH (ref 8–23)
CO2: 30 mmol/L (ref 22–32)
Calcium: 8.8 mg/dL — ABNORMAL LOW (ref 8.9–10.3)
Chloride: 100 mmol/L (ref 98–111)
Creatinine, Ser: 4.87 mg/dL — ABNORMAL HIGH (ref 0.61–1.24)
GFR, Estimated: 12 mL/min — ABNORMAL LOW (ref >60)
Glucose, Bld: 145 mg/dL — ABNORMAL HIGH (ref 70–99)
Potassium: 3.8 mmol/L (ref 3.5–5.1)
Sodium: 142 mmol/L (ref 135–145)
Total Bilirubin: 0.7 mg/dL (ref 0.0–1.2)
Total Protein: 7.6 g/dL (ref 6.5–8.1)

## 2024-05-01 LAB — TROPONIN T, HIGH SENSITIVITY
Troponin T High Sensitivity: 90 ng/L — ABNORMAL HIGH (ref 0–19)
Troponin T High Sensitivity: 96 ng/L — ABNORMAL HIGH (ref 0–19)

## 2024-05-01 LAB — PROTIME-INR
INR: 1.1 (ref 0.8–1.2)
Prothrombin Time: 14.4 s (ref 11.4–15.2)

## 2024-05-01 LAB — LACTIC ACID, PLASMA
Lactic Acid, Venous: 1.4 mmol/L (ref 0.5–1.9)
Lactic Acid, Venous: 3.1 mmol/L (ref 0.5–1.9)
Lactic Acid, Venous: 2.3 mmol/L (ref 0.5–1.9)

## 2024-05-01 LAB — LIPASE, BLOOD: Lipase: 41 U/L (ref 11–51)

## 2024-05-01 MED ORDER — OXYCODONE-ACETAMINOPHEN 5-325 MG PO TABS
1.0000 | ORAL_TABLET | Freq: Once | ORAL | Status: AC
  Administered 2024-05-01: 1 via ORAL
  Filled 2024-05-01: qty 1

## 2024-05-01 MED ORDER — FENTANYL CITRATE (PF) 50 MCG/ML IJ SOSY
50.0000 ug | PREFILLED_SYRINGE | Freq: Once | INTRAMUSCULAR | Status: AC
  Administered 2024-05-01: 50 ug via INTRAVENOUS
  Filled 2024-05-01: qty 1

## 2024-05-01 MED ORDER — LACTATED RINGERS IV SOLN: 150.0000 mL/h | INTRAVENOUS | Status: DC

## 2024-05-01 MED ORDER — METRONIDAZOLE 500 MG/100ML IV SOLN
500.0000 mg | Freq: Two times a day (BID) | INTRAVENOUS | Status: DC
  Administered 2024-05-01 – 2024-05-06 (×10): 500 mg via INTRAVENOUS
  Filled 2024-05-01 (×13): qty 100

## 2024-05-01 MED ORDER — HYDRALAZINE HCL 20 MG/ML IJ SOLN: 5.0000 mg | Freq: Four times a day (QID) | INTRAMUSCULAR | Status: AC | PRN

## 2024-05-01 MED ORDER — SODIUM CHLORIDE 0.9 % IV BOLUS
250.0000 mL | Freq: Once | INTRAVENOUS | Status: AC
  Administered 2024-05-01: 250 mL via INTRAVENOUS

## 2024-05-01 MED ORDER — MIDODRINE HCL 5 MG PO TABS
10.0000 mg | ORAL_TABLET | Freq: Once | ORAL | Status: AC
  Administered 2024-05-01: 10 mg via ORAL
  Filled 2024-05-01: qty 2

## 2024-05-01 MED ORDER — ACETAMINOPHEN 500 MG PO TABS
1000.0000 mg | ORAL_TABLET | Freq: Once | ORAL | Status: AC
  Administered 2024-05-01: 1000 mg via ORAL
  Filled 2024-05-01: qty 2

## 2024-05-01 MED ORDER — ONDANSETRON 4 MG PO TBDP: 4.0000 mg | ORAL_TABLET | Freq: Three times a day (TID) | ORAL | 0 refills | Status: DC | PRN

## 2024-05-01 MED ORDER — SODIUM CHLORIDE 0.9 % IV BOLUS
500.0000 mL | Freq: Once | INTRAVENOUS | Status: AC
  Administered 2024-05-01: 500 mL via INTRAVENOUS

## 2024-05-01 MED ORDER — ACETAMINOPHEN 325 MG PO TABS
650.0000 mg | ORAL_TABLET | Freq: Four times a day (QID) | ORAL | Status: DC | PRN
  Administered 2024-05-02 (×2): 650 mg via ORAL
  Filled 2024-05-01 (×3): qty 2

## 2024-05-01 MED ORDER — ONDANSETRON HCL 4 MG PO TABS: 4.0000 mg | ORAL_TABLET | Freq: Four times a day (QID) | ORAL | Status: DC | PRN

## 2024-05-01 MED ORDER — ONDANSETRON HCL 4 MG/2ML IJ SOLN
4.0000 mg | Freq: Once | INTRAMUSCULAR | Status: AC
  Administered 2024-05-01: 4 mg via INTRAVENOUS
  Filled 2024-05-01: qty 2

## 2024-05-01 MED ORDER — LEVOTHYROXINE SODIUM 50 MCG PO TABS
50.0000 ug | ORAL_TABLET | Freq: Every day | ORAL | Status: DC
  Administered 2024-05-02 – 2024-05-06 (×5): 50 ug via ORAL
  Filled 2024-05-01 (×5): qty 1

## 2024-05-01 MED ORDER — B COMPLEX-C PO TABS
1.0000 | ORAL_TABLET | Freq: Every day | ORAL | Status: DC
  Administered 2024-05-02 – 2024-05-05 (×4): 1 via ORAL
  Filled 2024-05-01 (×6): qty 1

## 2024-05-01 MED ORDER — HEPARIN SODIUM (PORCINE) 1000 UNIT/ML IJ SOLN
INTRAMUSCULAR | Status: AC
  Filled 2024-05-01: qty 4

## 2024-05-01 MED ORDER — SODIUM CHLORIDE 0.9 % IV SOLN
2.0000 g | INTRAVENOUS | Status: DC
  Administered 2024-05-02 – 2024-05-05 (×4): 2 g via INTRAVENOUS
  Filled 2024-05-01 (×6): qty 20

## 2024-05-01 MED ORDER — ATORVASTATIN CALCIUM 20 MG PO TABS
20.0000 mg | ORAL_TABLET | Freq: Every day | ORAL | Status: DC
  Administered 2024-05-02 – 2024-05-05 (×4): 20 mg via ORAL
  Filled 2024-05-01 (×4): qty 1

## 2024-05-01 MED ORDER — VANCOMYCIN HCL IN DEXTROSE 1-5 GM/200ML-% IV SOLN
1000.0000 mg | Freq: Once | INTRAVENOUS | Status: AC
  Administered 2024-05-01: 1000 mg via INTRAVENOUS
  Filled 2024-05-01: qty 200

## 2024-05-01 MED ORDER — HEPARIN SODIUM (PORCINE) 5000 UNIT/ML IJ SOLN
5000.0000 [IU] | Freq: Three times a day (TID) | INTRAMUSCULAR | Status: DC
  Administered 2024-05-01 – 2024-05-06 (×14): 5000 [IU] via SUBCUTANEOUS
  Filled 2024-05-01 (×15): qty 1

## 2024-05-01 MED ORDER — ACETAMINOPHEN 325 MG RE SUPP
650.0000 mg | Freq: Once | RECTAL | Status: AC
  Administered 2024-05-01: 650 mg via RECTAL
  Filled 2024-05-01: qty 2

## 2024-05-01 MED ORDER — IOHEXOL 300 MG/ML  SOLN
100.0000 mL | Freq: Once | INTRAMUSCULAR | Status: AC | PRN
  Administered 2024-05-01: 100 mL via INTRAVENOUS

## 2024-05-01 MED ORDER — METRONIDAZOLE 500 MG/100ML IV SOLN: 500.0000 mg | Freq: Once | INTRAVENOUS | Status: DC

## 2024-05-01 MED ORDER — ONDANSETRON HCL 4 MG/2ML IJ SOLN: 4.0000 mg | Freq: Four times a day (QID) | INTRAMUSCULAR | Status: DC | PRN

## 2024-05-01 MED ORDER — SEVELAMER CARBONATE 800 MG PO TABS
800.0000 mg | ORAL_TABLET | Freq: Three times a day (TID) | ORAL | Status: DC
  Administered 2024-05-02 – 2024-05-05 (×10): 800 mg via ORAL
  Filled 2024-05-01 (×14): qty 1

## 2024-05-01 MED ORDER — LACTATED RINGERS IV SOLN: INTRAVENOUS | Status: AC

## 2024-05-01 MED ORDER — SODIUM CHLORIDE 0.9 % IV SOLN
2.0000 g | Freq: Once | INTRAVENOUS | Status: AC
  Administered 2024-05-01: 2 g via INTRAVENOUS
  Filled 2024-05-01: qty 12.5

## 2024-05-01 MED ORDER — ACETAMINOPHEN 650 MG RE SUPP: 650.0000 mg | Freq: Four times a day (QID) | RECTAL | Status: DC | PRN

## 2024-05-01 MED ORDER — OXYCODONE-ACETAMINOPHEN 5-325 MG PO TABS: 1.0000 | ORAL_TABLET | ORAL | 0 refills | Status: AC | PRN

## 2024-05-01 MED ORDER — LACTATED RINGERS IV SOLN: INTRAVENOUS | Status: DC

## 2024-05-01 MED ORDER — SENNOSIDES-DOCUSATE SODIUM 8.6-50 MG PO TABS
1.0000 | ORAL_TABLET | Freq: Every evening | ORAL | Status: DC | PRN
  Administered 2024-05-06: 1 via ORAL
  Filled 2024-05-01 (×2): qty 1

## 2024-05-01 MED ORDER — CHLORHEXIDINE GLUCONATE CLOTH 2 % EX PADS
6.0000 | MEDICATED_PAD | Freq: Every day | CUTANEOUS | Status: DC
  Administered 2024-05-02 – 2024-05-06 (×5): 6 via TOPICAL
  Filled 2024-05-01 (×2): qty 6

## 2024-05-01 NOTE — Hospital Course (Signed)
 [name] is a 68 year old male with history of end-stage renal disease on hemodialysis, history of severe aortic stenosis status post TAVR, hyperlipidemia, neuropathy, hypothyroid, recent acute cholecystitis status post percutaneous cholecystostomy tube placement, PAD status post PCI and ambulation with BKA of the left leg, who presents to the ED for chief concerns of right sided abdominal pain.  Vitals in the ED showed Tmax of 102.7, rr 23, hr 126, blood pressure 132/85, SpO2 100% on room air.  Serum sodium is 142, potassium 3.8, chloride 100, bicarb 30, BUN of 32, serum protein 4.87, eGFR 12, nonfasting blood glucose 145, WBC 8.8, hemoglobin 9.0, platelets of 126.  HS troponin is 90, on repeat is 96.  Lactic acid is 1.4.    Blood cultures x 2 are in process.  ED treatment: Acetaminophen  650 mg p.o. rectally, acetaminophen  1000 mg p.o., fentanyl  50 mcg IV one-time dose, ondansetron  4 mg IV one-time dose, oxycodone /acetaminophen  5-325 mg p.o. one-time dose, cefepime  and vancomycin  per pharmacy, metronidazole  500 mg IV twice daily, sodium chloride  500 mL liter bolus.  LR infusion at 150 mL/h initiated.

## 2024-05-01 NOTE — Progress Notes (Signed)
 Patient presented to ED with complaints of abd pain. Evaluated by ED physicians and deemed stable for discharge. However patient is unable to receive outpatient dialysis at clinic due to late time. Will perform short dialysis treatment today piror to discharge.

## 2024-05-01 NOTE — Assessment & Plan Note (Signed)
 Home levothyroxine  50 mcg daily resume

## 2024-05-01 NOTE — Consult Note (Signed)
 CODE SEPSIS - PHARMACY COMMUNICATION  **Broad Spectrum Antibiotics should be administered within 1 hour of Sepsis diagnosis**  Time Code Sepsis Called/Page Received: 1503  Antibiotics Ordered: cefepime , metronidazole , vancomycin    Time of 1st antibiotic administration: 1532  Additional action taken by pharmacy: none  If necessary, Name of Provider/Nurse Contacted: n/a    Annabella LOISE Banks ,PharmD Clinical Pharmacist  05/01/2024  3:12 PM

## 2024-05-01 NOTE — ED Notes (Signed)
 This RN and Rosina, RN cleaned pt. New linens provided and pt repositioned in bed. Pt unable to follow commands and only answers yes to questions.

## 2024-05-01 NOTE — Assessment & Plan Note (Signed)
 Suspect demand ischemia in setting of elevated heart rate

## 2024-05-01 NOTE — TOC Initial Note (Signed)
 Transition of Care [facility]) - Initial/Assessment Note    Patient Details  Name: Phillip [name] MRN: [id number] Date of Birth: [birth year]  Transition of Care [facility] CM/SW Contact:    Takeria Marquina L Maddyx Wieck, LCSW Phone Number: 05/01/2024, 12:08 PM  Clinical Narrative:                  Secure chat received asking if CSW can contact patients HD center. CSW inquired if patient could receive HD while at [facility]. Family prefers he goes to his center.   Patient is supposed to receive treatment today by 10am. CSW contacted [facility] to determine if patient can still receive HD today. CSW was advised that patient would have had to be at the center by 11am. Patient is not able to come to the center to receive HD today. CSW relayed this information to the provider.          Patient Goals and CMS Choice            Expected Discharge Plan and Services                                              Prior Living Arrangements/Services                       Activities of Daily Living      Permission Sought/Granted                  Emotional Assessment              Admission diagnosis:  Abd pain Patient Active Problem List   Diagnosis Date Noted   S/P BKA (below knee amputation) unilateral, left (HCC) 04/12/2024   Protein-calorie malnutrition, severe 02/07/2024   Left below-knee amputee (HCC) 02/05/2024   S/P TAVR (transcatheter aortic valve replacement) 01/20/2024   Gangrene of toe of left foot (HCC) 01/14/2024   Aortic atherosclerosis    Aortic stenosis    History of marijuana use    Hypertension    Pulmonary HTN (HCC)    Thrombocytopenia    ESRD on dialysis (HCC) 11/15/2023   Acute cholecystitis 11/15/2023   Choledocholithiasis 11/12/2023   Pancytopenia (HCC) 08/12/2023   Asthma 09/27/2020   Personal history of tobacco use, presenting hazards to health 09/27/2020   Encounter for long-term (current) use of insulin  (HCC) 09/27/2020   Chronic pain of  right hip 03/31/2020   Acquired spondylolisthesis of lumbosacral region 03/20/2020   Osseous and subluxation stenosis of intervertebral foramina of lumbar region 03/20/2020   Primary osteoarthritis of right hip 02/21/2020   Hyperlipidemia associated with type 2 diabetes mellitus (HCC) 02/18/2020   Coronary artery disease    Diabetes mellitus without complication (HCC)    Peripheral vascular disease    Primary osteoarthritis involving multiple joints 10/06/2019   Osteoarthritis 10/06/2019   Anemia of chronic disease 08/18/2019   Anemia, chronic disease 08/18/2019   Vitamin B12 deficiency 06/16/2019   Polyneuropathy in diabetes (HCC) 05/07/2019   Chronic low back pain without sciatica 12/02/2018   Bilateral lower extremity edema 11/04/2018   Chronic idiopathic constipation 11/02/2018   Insomnia 11/02/2018   Abdominopelvic abscess (HCC) 09/29/2018   Abscess of appendix 09/22/2018   Appendicitis 09/08/2018   Vitamin D  deficiency 05/07/2018   Puncture wound of right hip 04/02/2018   Bradycardia 03/25/2018   Subacute osteomyelitis of left  foot (HCC) 03/25/2018   Atherosclerosis of native arteries of the extremities with ulceration (HCC) 02/24/2018   Essential hypertension 02/24/2018   Benign prostatic hyperplasia without lower urinary tract symptoms 01/14/2018   Erectile dysfunction 07/14/2017   Hypothyroidism (acquired) 11/14/2015   Hematuria 07/27/2015   CAD (coronary artery disease) 07/26/2015   Dysphagia 07/26/2015   Hyperlipidemia 07/26/2015   Sleep apnea 07/26/2015   Chronic combined systolic and diastolic CHF (congestive heart failure) (HCC) 07/26/2015   Type 2 diabetes mellitus with hyperlipidemia (HCC) 07/26/2015   S/P CABG x 4 2003   PCP:  [name] LABOR., MD Pharmacy:   [facility] [address] [place], [place] - [address] [address] [phone] [address] [place] [address] Phone: [phone] Fax: [phone]     Social Drivers of Health (SDOH) Social  History: SDOH Screenings   Food Insecurity: No Food Insecurity (04/12/2024)  Housing: Low Risk  (04/12/2024)  Transportation Needs: No Transportation Needs (04/12/2024)  Utilities: Not At Risk (04/12/2024)  Depression (PHQ2-9): Low Risk  (10/22/2018)  Financial Resource Strain: Low Risk (09/26/2020)   Received from [facility]  Physical Activity: Inactive (09/26/2020)   Received from [facility]  Social Connections: Unknown (04/12/2024)  Stress: No Stress Concern Present (09/26/2020)   Received from [facility]  Tobacco Use: Medium Risk (05/01/2024)  Health Literacy: Low Risk (09/26/2020)   Received from [facility]   SDOH Interventions:     Readmission Risk Interventions    02/05/2024    1:08 PM 08/13/2023    4:28 PM  Readmission Risk Prevention Plan  Transportation Screening Complete Complete  HRI or Home Care Consult  Complete  Palliative Care Screening  Not Applicable  Medication Review (RN Care Manager) Complete Complete  HRI or Home Care Consult Complete   SW Recovery Care/Counseling Consult Complete   Palliative Care Screening Not Applicable   Skilled Nursing Facility Not Applicable

## 2024-05-01 NOTE — Assessment & Plan Note (Signed)
 Aspirin  81 mg daily not resumed on admission AM team to resume when benefits outweigh the risk

## 2024-05-01 NOTE — Assessment & Plan Note (Signed)
 Wound VAC in place.

## 2024-05-01 NOTE — Progress Notes (Signed)
 Elink following for sepsis protocol.

## 2024-05-01 NOTE — Assessment & Plan Note (Addendum)
 Patient met SIRS criteria with fever, Tmax of 102.7, increased respiration rate, increased heart rate Organ involvement likely neurological/altered mentation with possible source of intra-abdominal in setting of cholecystostomy tube versus left BKA Interventional radiology has been consulted by EDP, Dr. Jenna is aware Per EDP, IR, Dr. Jenna states that IR will see the patient Continue with metronidazole  500 mg IV twice daily, ceftriaxone  2 g IV daily for intra-abdominal source Blood cultures x 2 are in process Maintain MAP > 65

## 2024-05-01 NOTE — ED Notes (Signed)
 Called CCMD to place pt on central monitoring

## 2024-05-01 NOTE — ED Triage Notes (Signed)
 Coming from home right sided abd pain around wound vac since last night. met sepsis for EMS. Patient also on dialysis. Left arm restricted. T, Th, Sat. Received dialysis Thursday per patient  50mcg fent with EMS that did not relieve pain with EMS.   156/74, CBG:200,

## 2024-05-01 NOTE — Discharge Instructions (Addendum)
 You were seen in the emergency department for right-sided abdominal pain.  You had a CT scan with contrast dye that showed findings concerning for hydronephrosis or backup into your right kidney.  You had a CT scan with contrast dye that did not show any signs of an infection or abscess around your gallbladder.  Your lab work was overall at your normal.  After discussion with urology it was recommended that you start on pain control and follow-up as an outpatient for further workup for right sided hydronephrosis which is likely causing your pain today.  You are given a prescription for opioid pain medication.  Call your primary care physician and urology for close follow-up.  Return to the emergency department if you had any ongoing or worsening symptoms.  You were given a prescription for narcotic pain medications.  Take only if in severe pain.  These are very addictive medications.  These medications can make you constipated.  If you need to take more than 1-2 doses, start a stool softner.  If you become constipated, take 1 capfull of MiraLAX , can repeat untill having regular bowel movements.  Keep this medication out of reach of any children. You cannot drive/work while taking this medication.

## 2024-05-01 NOTE — Assessment & Plan Note (Signed)
 Hydralazine  5 mg IV every 6 hours as needed for SBP > 175, 5 days

## 2024-05-01 NOTE — Assessment & Plan Note (Addendum)
 Of the right upper and right lower extremity weakness With change in mental status Unclear how long this has been going on Patient spouse reports that patient is right-handed.  He just had physical therapy so sometimes after physical therapy he does have right-sided weakness.  However normally on the right leg, he is able to move without difficulty CT head wo contrast: Advanced chronic small vessel disease, including age-indeterminate involvement of the right thalamus MRI brain wo contrast ordered

## 2024-05-01 NOTE — Assessment & Plan Note (Signed)
Home atorvastatin 20 mg daily resumed

## 2024-05-01 NOTE — ED Notes (Addendum)
 Rapid Response called on the dialysis unit. This RN responded. RN at bedside states pt did receive full 2.5 hr dialysis tx, pt had a sudden onset of chills. Dialysis RN states pt HR increased to 140s and temp increased to 102.7 orally. Pt is at his baseline mentally but states they placed pt on 2 L [place]. Pt transported back to the ED at this time. Dr. Suzanne made aware and at bedside on arrival.

## 2024-05-01 NOTE — H&P (Addendum)
 History and Physical   Phillip [name] DOB: 1956/02/21 DOA: 05/01/2024  PCP: Phillip [name]  Patient coming from: home via EMS  I have personally briefly reviewed patient's old medical records in [facility] EMR.  Chief Concern: Abdominal pain  HPI: [name] is a 68 year old male with history of end-stage renal disease on hemodialysis, history of severe aortic stenosis status post TAVR, hyperlipidemia, neuropathy, hypothyroid, recent acute cholecystitis status post percutaneous cholecystostomy tube placement, PAD status post PCI and ambulation with BKA of the left leg, who presents to the ED for chief concerns of right sided abdominal pain.  Vitals in the ED showed Tmax of 102.7, rr 23, hr 126, blood pressure 132/85, SpO2 100% on room air.  Serum sodium is 142, potassium 3.8, chloride 100, bicarb 30, BUN of 32, serum protein 4.87, eGFR 12, nonfasting blood glucose 145, WBC 8.8, hemoglobin 9.0, platelets of 126.  HS troponin is 90, on repeat is 96.  Lactic acid is 1.4.    Blood cultures x 2 are in process.  ED treatment: Acetaminophen  650 mg p.o. rectally, acetaminophen  1000 mg p.o., fentanyl  50 mcg IV one-time dose, ondansetron  4 mg IV one-time dose, oxycodone /acetaminophen  5-325 mg p.o. one-time dose, cefepime  and vancomycin  per pharmacy, metronidazole  500 mg IV twice daily, sodium chloride  500 mL liter bolus.  LR infusion at 150 mL/h initiated. ---------------------------------------------- At bedside, patient was able to tell me his first name is Phillip [name], and his age.  He knows he is in the hospital.  At bedside, patient weakly moves his right upper extremity and was not able to move his left lower extremity.  Patient has a history with ambulation despite BKA.  Patient was not able to provide much H&P due to difficulty speaking.  Per nursing at bedside this difficulty with speaking is new as when patient came to the ED, he was joking with the staff and  moving extremities without difficulty.  Social history: Patient lives at home  ROS: Unable to complete due to acuity of patient presentation  ED Course: Discussed with EDP, patient requiring hospitalization for chief concerns of meeting sepsis criteria.  Assessment/Plan  Principal Problem:   SIRS (systemic inflammatory response syndrome) (HCC) Active Problems:   Right sided weakness   CAD (coronary artery disease)   Hypothyroidism (acquired)   Essential hypertension   ESRD on dialysis [facility])   Atherosclerosis of native arteries of the extremities with ulceration (HCC)   Diabetes mellitus without complication (HCC)   Hyperlipidemia associated with type 2 diabetes mellitus (HCC)   Pulmonary HTN (HCC)   S/P TAVR (transcatheter aortic valve replacement)   Protein-calorie malnutrition, severe   S/P BKA (below knee amputation) unilateral, left (HCC)   Elevated troponin   Assessment and Plan:  * SIRS (systemic inflammatory response syndrome) (HCC) Patient met SIRS criteria with fever, Tmax of 102.7, increased respiration rate, increased heart rate Organ involvement likely neurological/altered mentation with possible source of intra-abdominal in setting of cholecystostomy tube versus left BKA Interventional radiology has been consulted by EDP, Dr. Jenna is aware Per EDP, IR, Dr. Jenna states that IR will see the patient Continue with metronidazole  500 mg IV twice daily, ceftriaxone  2 g IV daily for intra-abdominal source Blood cultures x 2 are in process Maintain MAP > 65  Right sided weakness Of the right upper and right lower extremity weakness With change in mental status Unclear how long this has been going on Patient spouse reports that patient is right-handed.  He just  had physical therapy so sometimes after physical therapy he does have right-sided weakness.  However normally on the right leg, he is able to move without difficulty CT head wo contrast: Advanced chronic  small vessel disease, including age-indeterminate involvement of the right thalamus MRI brain wo contrast ordered  CAD (coronary artery disease) Aspirin  81 mg daily not resumed on admission AM team to resume when benefits outweigh the risk  ESRD on dialysis [facility]) Nephrology specialist consulted via epic order for continuation of end-stage renal disease on hemodialysis treatment  Essential hypertension Hydralazine  5 mg IV every 6 hours as needed for SBP > 175, 5 days  Hypothyroidism (acquired) Home levothyroxine  50 mcg daily resume  Elevated troponin Suspect demand ischemia in setting of elevated heart rate  S/P BKA (below knee amputation) unilateral, left (HCC) Wound VAC in place  Hyperlipidemia associated with type 2 diabetes mellitus (HCC) Home atorvastatin  20 mg daily resumed  Diabetes mellitus without complication (HCC) Labs A1c was 4.4.  Insulin  SSI with at bedtime coverage is not indicated at this time  Chart reviewed.   DVT prophylaxis: Heparin  5000 units subcutaneous q. did hours Code Status: Full code Diet: Renal Family Communication: updated spouse, Phillip [name] over the phone  Disposition Plan: Pending clinical course Consults called: Nephrology, IR Admission status: pcu, inpatient   Past Medical History:  Diagnosis Date   Abdominopelvic abscess (HCC) 09/29/2018   Abscess of appendix 09/22/2018   Acquired spondylolisthesis of lumbosacral region 03/20/2020   Acute cholecystitis 11/15/2023   Anemia of chronic disease 08/18/2019   Anemia, chronic disease 08/18/2019   Aortic atherosclerosis    Aortic stenosis    a.) TTE 09/15/2018: mild-mod (MPG 19); b.) TTE 03/21/2021: mild-mod (MPG 14.3); c.) TTE 04/24/2022: mild- mod (MPG 15)   Appendicitis 09/08/2018   Asthma    Atherosclerosis of native arteries of the extremities with ulceration (HCC) 02/24/2018   Formatting of this note might be different from the original.  Last Assessment & Plan:   His ABIs today are 1.07  on the right and 0.99 on the left with biphasic waveforms.  Although these pressures may be somewhat elevated from medial calcification, his flow does appear to be reasonably good.  His left ABI was 0.58 prior to intervention.  At this point, we will stretch out his follow-up and see hi   Benign prostatic hyperplasia without lower urinary tract symptoms 01/14/2018   Bilateral lower extremity edema 11/04/2018   Bradycardia 03/25/2018   CAD (coronary artery disease) 2003   a.) s/p 4v CABG 2003   Choledocholithiasis 11/12/2023   Chronic combined systolic and diastolic CHF (congestive heart failure) (HCC) 07/26/2015   a.) TTE 09/15/2018: EF 50-55%, mod LVH, RVE, BAE, mild-mod TR, AoV sclerosis, G1DD; b.) MPI 10/27/2019: EF <30%; c.) TTE 03/21/2021: EF 35-40%, post AK, inf HK, mod LVH, mod red RVSF, mod LAE, mod Aov sclerosis, G2DD; c.) TTE 04/24/2022: EF 35-40%, post AK, glok HK, mod LVE, mod red RVSF, mild-mod MR, Aov sclerosis, G3DD   Chronic idiopathic constipation 11/02/2018   Chronic low back pain without sciatica 12/02/2018   Chronic pain of right hip 03/31/2020   Coronary artery disease    Diabetes mellitus without complication (HCC)    Dysphagia 07/26/2015   Encounter for long-term (current) use of insulin  (HCC) 09/27/2020   Erectile dysfunction 07/14/2017   ESRD on dialysis (HCC) 11/15/2023   Essential hypertension 02/24/2018   Gangrene of toe of left foot (HCC) 01/14/2024   Hematuria 07/27/2015   History of marijuana use  Hyperlipidemia 07/26/2015   Hyperlipidemia associated with type 2 diabetes mellitus (HCC) 02/18/2020   Hypertension    Hypothyroidism (acquired) 11/14/2015   Insomnia 11/02/2018   Left below-knee amputee (HCC) 02/05/2024   Osseous and subluxation stenosis of intervertebral foramina of lumbar region 03/20/2020   Osteoarthritis 10/06/2019   Pancytopenia (HCC) 08/12/2023   Peripheral vascular disease    Personal history of tobacco use, presenting hazards to  health 09/27/2020   Polyneuropathy in diabetes (HCC) 05/07/2019   Primary osteoarthritis involving multiple joints 10/06/2019   Primary osteoarthritis of right hip 02/21/2020   Protein-calorie malnutrition, severe 02/07/2024   Pulmonary HTN (HCC)    a.) TTE 05/12/2019: PASP 71; b.) TTE 09/27/2020: PASP >70; c.) TTE 03/21/2021: RVSP 43; d.) TTE 04/24/2022: RVSP 80.6   Puncture wound of right hip 04/02/2018   S/P CABG x 4 2003   S/P TAVR (transcatheter aortic valve replacement) 01/20/2024   TAVR with a 29 mm Edwards Sapien 3 Ultra Resilia THV via the TF by Dr. Wendel and Dr. Shyrl   Sleep apnea 07/26/2015   a.) does not require nocturnal PAP therapy   Subacute osteomyelitis of left foot (HCC) 03/25/2018   Thrombocytopenia    Type 2 diabetes mellitus with hyperlipidemia (HCC) 07/26/2015   Vitamin B12 deficiency 06/16/2019   Vitamin D  deficiency 05/07/2018   Past Surgical History:  Procedure Laterality Date   AMPUTATION Left 01/14/2024   Procedure: AMPUTATION, FOOT, PARTIAL;  Surgeon: Serene Gaile ORN, [name];  Location: [facility] OR;  Service: Vascular;  Laterality: Left;   AMPUTATION Left 01/30/2024   Procedure: AMPUTATION BELOW KNEE;  Surgeon: Serene Gaile ORN, [name];  Location: [facility] OR;  Service: Vascular;  Laterality: Left;   AMPUTATION TOE Left 03/13/2018   Procedure: AMPUTATION TOE-MPJ;  Surgeon: Ashley Soulier, DPM;  Location: [facility] ORS;  Service: Podiatry;  Laterality: Left;   ANGIOPLASTY     APPLICATION OF WOUND VAC Left 04/12/2024   Procedure: APPLICATION, WOUND VAC;  Surgeon: Lanis Fonda BRAVO, [name];  Location: [facility] OR;  Service: Vascular;  Laterality: Left;   AV FISTULA PLACEMENT Left 08/20/2023   Procedure: ARTERIOVENOUS (AV) FISTULA CREATION;  Surgeon: Gretta Lonni PARAS, [name];  Location: [facility] OR;  Service: Vascular;  Laterality: Left;   CARDIAC CATHETERIZATION     CORONARY ARTERY BYPASS GRAFT N/A 2003   INCISION AND DRAINAGE OF WOUND Left 04/12/2024   Procedure: IRRIGATION AND DEBRIDEMENT  WOUND;  Surgeon: Lanis Fonda BRAVO, [name];  Location: [facility] OR;  Service: Vascular;  Laterality: Left;   INTRAOPERATIVE TRANSTHORACIC ECHOCARDIOGRAM N/A 01/20/2024   Procedure: ECHOCARDIOGRAM, TRANSTHORACIC;  Surgeon: Wendel Lurena POUR, [name];  Location: [facility] INVAS[facility];  Service: Cardiovascular;  Laterality: N/A;   IR CATHETER TUBE CHANGE  12/31/2023   IR DIALY SHUNT INTRO NEEDLE/INTRACATH INITIAL W/IMG LEFT Left 02/09/2024   IR EXCHANGE BILIARY DRAIN  02/03/2024   IR EXCHANGE BILIARY DRAIN  02/17/2024   IR EXCHANGE BILIARY DRAIN  03/05/2024   IR FLUORO GUIDE CV LINE RIGHT  08/18/2023   IR PERC CHOLECYSTOSTOMY  11/12/2023   IR RADIOLOGIST EVAL & MGMT  12/31/2023   IR TUNNELED CENTRAL VENOUS CATH PLC W IMG  02/23/2024   IR US  GUIDE VASC ACCESS RIGHT  08/18/2023   LAPAROSCOPIC APPENDECTOMY N/A 09/08/2018   Procedure: APPENDECTOMY LAPAROSCOPIC;  Surgeon: Jordis Laneta FALCON, [name];  Location: [facility] ORS;  Service: General;  Laterality: N/A;   LOWER EXTREMITY ANGIOGRAPHY Left 03/02/2018   Procedure: LOWER EXTREMITY ANGIOGRAPHY;  Surgeon: Marea Selinda RAMAN, [name];  Location: [facility] INVASIVE CV  LAB;  Service: Cardiovascular;  Laterality: Left;   LOWER EXTREMITY ANGIOGRAPHY Left 09/30/2022   Procedure: Lower Extremity Angiography;  Surgeon: Marea Selinda RAMAN, [name];  Location: [facility] INVASIVE CV LAB;  Service: Cardiovascular;  Laterality: Left;   LOWER EXTREMITY ANGIOGRAPHY Left 12/26/2023   Procedure: Lower Extremity Angiography;  Surgeon: Marea Selinda RAMAN, [name];  Location: [facility] INVASIVE CV LAB;  Service: Cardiovascular;  Laterality: Left;   RIGHT/LEFT HEART CATH AND CORONARY/GRAFT ANGIOGRAPHY N/A 08/01/2023   Procedure: RIGHT/LEFT HEART CATH AND CORONARY/GRAFT ANGIOGRAPHY;  Surgeon: Wendel Lurena POUR, [name];  Location: [facility];  Service: Cardiovascular;  Laterality: N/A;   ROTATOR CUFF REPAIR Left    TRANSMETATARSAL AMPUTATION Left 01/16/2024   Procedure: AMPUTATION, FOOT, TRANSMETATARSAL;  Surgeon: Serene Gaile ORN, [name];  Location: [facility] OR;   Service: Vascular;  Laterality: Left;   Social History:  reports that he quit smoking about 22 years ago. His smoking use included cigarettes. He started smoking about 47 years ago. He has a 12.5 pack-year smoking history. He has never used smokeless tobacco. He reports that he does not currently use alcohol. He reports that he does not use drugs.  No Known Allergies Family History  Problem Relation Age of Onset   Hyperlipidemia Mother    Hypertension Mother    Diabetes Mother    Heart attack Brother    Heart disease Brother    Lung disease Father    Family history: Family history reviewed and not pertinent.  Prior to Admission medications   Medication Sig Start Date End Date Taking? Authorizing Provider  ondansetron  (ZOFRAN -ODT) 4 MG disintegrating tablet Take 1 tablet (4 mg total) by mouth every 8 (eight) hours as needed for nausea or vomiting. 05/01/24  Yes Mumma, Clotilda, [name]  oxyCODONE -acetaminophen  (PERCOCET) 5-325 MG tablet Take 1 tablet by mouth every 4 (four) hours as needed for up to 5 days for severe pain (pain score 7-10). 05/01/24 05/20/2024 Yes Mumma, Clotilda, [name]  acetaminophen  (TYLENOL ) 325 MG tablet Take 2 tablets (650 mg total) by mouth every 6 (six) hours as needed for mild pain (pain score 1-3) or fever (or Fever >/= 101). 02/19/24   Angiulli, Toribio PARAS, PA-C  aspirin  EC 81 MG tablet Take 1 tablet (81 mg total) by mouth daily. Swallow whole. 07/25/23   Thukkani, Arun K, [name]  atorvastatin  (LIPITOR) 20 MG tablet Take 1 tablet (20 mg total) by mouth daily. 02/19/24   Angiulli, Toribio PARAS, PA-C  B Complex-C (B-COMPLEX WITH VITAMIN C) tablet Take 1 tablet by mouth daily. 02/19/24   Angiulli, Toribio PARAS, PA-C  carvedilol  (COREG ) 6.25 MG tablet Take 6.125 mg (1 tablet) in the morning and take 3.125 mg (0.5 tablet) in the evening. 03/03/24   Revankar, Jennifer SAUNDERS, [name]  gabapentin  (NEURONTIN ) 100 MG capsule Take 2 capsules (200 mg total) by mouth at bedtime. Patient taking differently: Take 600 mg by  mouth 2 (two) times daily. 02/19/24   Angiulli, Toribio PARAS, PA-C  levothyroxine  (SYNTHROID ) 50 MCG tablet Take 1 tablet (50 mcg total) by mouth daily. 02/19/24   Angiulli, Toribio PARAS, PA-C  lidocaine  (LIDODERM ) 5 % Place 1 patch onto the skin daily. Remove & Discard patch within 12 hours or as directed by [name] 04/15/24   Sherrill Cable Latif, DO  nitroGLYCERIN  (NITROSTAT ) 0.4 MG SL tablet Place 1 tablet (0.4 mg total) under the tongue every 5 (five) minutes as needed for chest pain. 02/19/24   Angiulli, Toribio PARAS, PA-C  oxyCODONE  (OXY IR/ROXICODONE ) 5 MG immediate release tablet  Take 1 tablet (5 mg total) by mouth every 6 (six) hours as needed for severe pain (pain score 7-10). 03/22/24   Baglia, Corrina, PA-C  sevelamer  carbonate (RENVELA ) 800 MG tablet Take 1 tablet (800 mg total) by mouth 3 (three) times daily with meals. 02/19/24 05/16/25  Raulkar, Krutika P, [name]  vitamin D3 (CHOLECALCIFEROL ) 25 MCG tablet Take 1 tablet (1,000 Units total) by mouth daily. 02/19/24   Pegge Toribio PARAS, PA-C   Physical Exam: Vitals:   05/01/24 1431 05/01/24 1508 05/01/24 1556 05/01/24 1635  BP: (!) 120/92     Pulse: (!) 134     Resp: (!) 26     Temp:  (!) 102.7 F (39.3 C) (!) 104.4 F (40.2 C) (!) 102.7 F (39.3 C)  TempSrc:  Axillary Oral Oral  SpO2: 99%     Weight:      Height:       Constitutional: appears age-appropriate, NAD, calm Eyes: PERRL, lids and conjunctivae normal ENMT: Mucous membranes are dry. Posterior pharynx clear of any exudate or lesions. Age-appropriate dentition.  unable to assess hearing  Neck: normal, supple, no masses, no thyromegaly Respiratory: clear to auscultation bilaterally, no wheezing, no crackles. Normal respiratory effort. No accessory muscle use.  Cardiovascular: Regular rate and rhythm, no murmurs / rubs / gallops. No extremity edema. 2+ pedal pulses. No carotid bruits.  Abdomen: no tenderness, no masses palpated, no hepatosplenomegaly. Bowel sounds positive.  Cholecystostomy  tube/bag in place Musculoskeletal: no clubbing / cyanosis. No joint deformity upper and lower extremities.  Decreased ROM of the right upper and lower extremities, no contractures, no atrophy. Normal muscle tone.  Left BKA with wound VAC in place Skin: no rashes, lesions, ulcers. No induration Neurologic: Appears to have decrease sensation in the right upper and lower extremity, decrease strength of the right upper and lower extremities Psychiatric: Unable to assess judgment and insight.  Unable to assess alert and oriented x 3.  Unable to assess mood.   EKG: independently reviewed, showing sinus rhythm with a rate of 85, QTc 506  Chest x-ray on Admission: I personally reviewed and I agree with radiologist reading as below.  CT HEAD WO CONTRAST ( ) Result Date: 05/01/2024 EXAM: CT HEAD WITHOUT CONTRAST 05/01/2024 04:27:00 PM TECHNIQUE: CT of the head was performed without the administration of intravenous contrast. Automated exposure control, iterative reconstruction, and/or weight based adjustment of the mA/kV was utilized to reduce the radiation dose to as low as reasonably achievable. COMPARISON: None available. CLINICAL HISTORY: 68 year old male. Neuro deficit, acute, stroke suspected. FINDINGS: BRAIN AND VENTRICLES: Normal brain volume for age. No acute hemorrhage. No hydrocephalus. No extra-axial collection. No mass effect or midline shift. Asymmetric moderate patchy and scattered bilateral cerebral white matter hypodensity. Age indeterminate hypodensity in the medial right thalamus on series 2 image 14. Similar small but circumscribed hypodensity in the right caudate nucleus appears to be a chronic lacunar infarct (coronal image 29). Moderate patchy hypodensity in the left paracentral pons (series 2 image 9). No suspicious intracranial vascular hyperdensity. ORBITS: No acute abnormality. SINUSES: Paranasal sinuses, middle ears and mastoids are well aerated. SOFT TISSUES AND SKULL: Calcified  scalp vessel atherosclerosis. No gaze deviation. Calcified atherosclerosis at the skull base. No acute soft tissue abnormality. No skull fracture. IMPRESSION: 1. Advanced chronic small vessel disease, including age indeterminate involvement of the right thalamus. Electronically signed by: Helayne Hurst [name] 05/01/2024 04:49 PM EST RP Workstation: [id number]   CT ABDOMEN PELVIS W CONTRAST Result Date: 05/01/2024 EXAM:  CT ABDOMEN AND PELVIS WITH CONTRAST 05/01/2024 08:12:07 AM TECHNIQUE: CT of the abdomen and pelvis was performed with the administration of 100 mL of iohexol  (OMNIPAQUE ) 300 MG/ML solution. Multiplanar reformatted images are provided for review. Automated exposure control, iterative reconstruction, and/or weight-based adjustment of the mA/kV was utilized to reduce the radiation dose to as low as reasonably achievable. COMPARISON: CT of the abdomen and pelvis dated 02/15/2024. CLINICAL HISTORY: Abdominal pain, acute, nonlocalized; perc chole drain, RUQ pain, purulent drainage, eval for infection. FINDINGS: LOWER CHEST: There is mild atelectasis present dependently within the lower lobes bilaterally. The patient is again noted to be status post aortic valve repair. LIVER: The liver is unremarkable. GALLBLADDER AND BILE DUCTS: A cholecystostomy tube remains in place. No biliary ductal dilatation. SPLEEN: No acute abnormality. PANCREAS: No acute abnormality. ADRENAL GLANDS: No acute abnormality. KIDNEYS, URETERS AND BLADDER: There is moderate right hydroureteronephrosis. No stones in the kidneys or ureters. No perinephric or periureteral stranding. Urinary bladder is unremarkable. GI AND BOWEL: Stomach demonstrates no acute abnormality. There is no bowel obstruction. There are few scattered colonic diverticula. The patient is status post appendectomy. PERITONEUM AND RETROPERITONEUM: No ascites. No free air. VASCULATURE: The abdominal aorta is normal in caliber and demonstrates moderate calcific atheromatous  disease. LYMPH NODES: No lymphadenopathy. REPRODUCTIVE ORGANS: No acute abnormality. BONES AND SOFT TISSUES: There is bilateral spondylolysis and grade 1 anterolisthesis at L5-S1. No focal soft tissue abnormality. IMPRESSION: 1. Moderate right hydroureteronephrosis without stones or perinephric/periureteral stranding. Correlate clinically for obstructive etiology; consider urologic consultation if clinically indicated. 2. Cholecystostomy tube in place. No biliary ductal dilatation. Electronically signed by: Evalene Coho [name] 05/01/2024 08:26 AM EST RP Workstation: [id number]   DG Chest Port 1 View Result Date: 05/01/2024 CLINICAL DATA:  Sepsis. EXAM: PORTABLE CHEST 1 VIEW COMPARISON:  11/11/2023 FINDINGS: The heart size and mediastinal contours are within normal limits. Prior CABG and TAVR noted. Right jugular dual-lumen central venous dialysis catheter remains in appropriate position. Both lungs are clear. IMPRESSION: No active disease. Electronically Signed   By: Norleen DELENA Kil M.D.   On: 05/01/2024 07:52   Labs on Admission: I have personally reviewed following labs  CBC: Recent Labs  Lab 05/01/24 0731  WBC 8.8  NEUTROABS 7.3  HGB 9.0*  HCT 29.1*  MCV 99.3  PLT 126*   Basic Metabolic Panel: Recent Labs  Lab 05/01/24 0731  NA 142  K 3.8  CL 100  CO2 30  GLUCOSE 145*  BUN 32*  CREATININE 4.87*  CALCIUM  8.8*   GFR: Estimated Creatinine Clearance: 15.9 mL/min (A) (by C-G formula based on SCr of 4.87 mg/dL (H)).  Liver Function Tests: Recent Labs  Lab 05/01/24 0731  AST 39  ALT 19  ALKPHOS 238*  BILITOT 0.7  PROT 7.6  ALBUMIN  3.4*   Recent Labs  Lab 05/01/24 0731  LIPASE 41   Coagulation Profile: Recent Labs  Lab 05/01/24 0731  INR 1.1   BNP (last 3 results) Recent Labs    07/14/23 1412  PROBNP 11,269*   Urine analysis:    Component Value Date/Time   COLORURINE YELLOW 11/07/2020 0744   APPEARANCEUR CLEAR 11/07/2020 0744   LABSPEC 1.011 11/07/2020 0744    PHURINE 5.0 11/07/2020 0744   GLUCOSEU 50 (A) 11/07/2020 0744   HGBUR SMALL (A) 11/07/2020 0744   BILIRUBINUR NEGATIVE 11/07/2020 0744   KETONESUR NEGATIVE 11/07/2020 0744   PROTEINUR 100 (A) 11/07/2020 0744   NITRITE NEGATIVE 11/07/2020 0744   LEUKOCYTESUR NEGATIVE 11/07/2020 0744  CRITICAL CARE Performed by: Dr. Sherre  Total critical care time: 32 minutes  Critical care time was exclusive of separately billable procedures and treating other patients.  Critical care was necessary to treat or prevent imminent or life-threatening deterioration.  Critical care was time spent personally by me on the following activities: development of treatment plan with spouse as well as nursing, discussions with consultants, evaluation of patient's response to treatment, examination of patient, obtaining history from patient or surrogate, ordering and performing treatments and interventions, ordering and review of laboratory studies, ordering and review of radiographic studies, pulse oximetry and re-evaluation of patient's condition.  This document was prepared using Dragon Voice Recognition software and may include unintentional dictation errors.  Dr. Sherre [facility]  If 7PM-7AM, please contact overnight-coverage provider If 7AM-7PM, please contact day attending provider [email or web address]  05/01/2024, 5:28 PM

## 2024-05-01 NOTE — ED Notes (Signed)
 EKG monitor showing frequent PVCs.  BP 95/77 (83). MD made aware.

## 2024-05-01 NOTE — Assessment & Plan Note (Signed)
 Nephrology specialist consulted via epic order for continuation of end-stage renal disease on hemodialysis treatment

## 2024-05-01 NOTE — ED Notes (Signed)
 Pt returned from CT

## 2024-05-01 NOTE — ED Provider Notes (Addendum)
 [facility] Provider Note    Event Date/Time   First MD Initiated Contact with Patient 05/01/24 0730     (approximate)   History   Abdominal Pain   HPI  Phillip [name] is a 68 y.o. 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  Patient states that last night he started having right-sided abdominal pain.  Some mild nausea but no episodes of vomiting.  No fever or chills.  Was given IV fentanyl  with only mild improvement of his pain with EMS.  Denies any blood in his stool or melena.  Currently has a percutaneous drain to his right gallbladder that was placed he states approximately a couple months ago.  States that he is due for dialysis today.  Makes a small amount of urine intermittently.  On chart review patient had acute cholecystitis and had surgery on 01/12/2024 with Dr. Dasie.  Amputation with BKA with Dr. Serene with vascular surgery,     Physical Exam   Triage Vital Signs: ED Triage Vitals  Encounter Vitals Group     BP 05/01/24 0730 130/75     Girls Systolic BP Percentile --      Girls Diastolic BP Percentile --      Boys Systolic BP Percentile --      Boys Diastolic BP Percentile --      Pulse Rate 05/01/24 0728 85     Resp 05/01/24 0728 (!) 21     Temp --      Temp src --      SpO2 05/01/24 0728 100 %     Weight 05/01/24 0729 178 lb (80.7 kg)     Height 05/01/24 0729 6' (1.829 m)     Head Circumference --      Peak Flow --      Pain Score 05/01/24 0725 9     Pain Loc --      Pain Education --      Exclude from Growth Chart --     Most recent vital signs: Vitals:   05/01/24 1431 05/01/24 1508  BP: (!) 120/92   Pulse: (!) 134   Resp: (!) 26   Temp:  (!) 102.7  F (39.3 C)  SpO2: 99%     Physical Exam Constitutional:      Appearance: He is well-developed.  HENT:     Head: Atraumatic.  Eyes:     Conjunctiva/sclera: Conjunctivae normal.  Cardiovascular:     Rate and Rhythm: Regular rhythm.  Pulmonary:     Effort: No respiratory distress.  Abdominal:     Tenderness: There is abdominal tenderness in the right upper quadrant.     Comments: Percutaneous cholecystostomy drain to the right upper abdomen with purulent drainage, no significant erythema or warmth.  Does have significant tenderness to palpation to this area.  Musculoskeletal:     Cervical back: Normal range of motion.  Skin:    General: Skin is warm.     Comments: BKA to the left lower extremity with wound VAC in place, no surrounding erythema warmth or induration.  No purulent drainage or crepitance.  Neurological:     Mental Status: He is alert. Mental status is at baseline.     IMPRESSION /  MDM / ASSESSMENT AND PLAN / ED COURSE  I reviewed the triage vital signs and the nursing notes.  Differential diagnosis including intra-abdominal abscess, pancreatitis, ACS, infectious process, malignancy   EKG  I, Clotilda Punter, the attending physician, personally viewed and interpreted this ECG.  EKG showed underlying left ventricular hypertrophy with strain pattern.  Multiple PVCs present.  No significant change but does have increased PVCs when compared to prior.  QTc 506.  QRS 180.  PR 187.  No tachycardic or bradycardic dysrhythmias while on cardiac telemetry.  RADIOLOGY I independently reviewed imaging, my interpretation of imaging: CT scan abdomen and pelvis with contrast dye -no signs of biliary abscess or biliary obstruction.  Normal common bile duct with drain in place.  Hydronephrosis with no obvious kidney stone identified.  Discussed with radiology tech and states that if he is due for dialysis within 24 hours we will plan to do contrasted CT scan.  Reach out to Dr.  Marcelino who stated if we are unable to get to his dialysis appointment today could do a dialysis in the hospital given his contrast.  LABS (all labs ordered are listed, but only abnormal results are displayed) Labs interpreted as -    Labs Reviewed  COMPREHENSIVE METABOLIC PANEL WITH GFR - Abnormal; Notable for the following components:      Result Value   Glucose, Bld 145 (*)    BUN 32 (*)    Creatinine, Ser 4.87 (*)    Calcium  8.8 (*)    Albumin  3.4 (*)    Alkaline Phosphatase 238 (*)    GFR, Estimated 12 (*)    All other components within normal limits  CBC WITH DIFFERENTIAL/PLATELET - Abnormal; Notable for the following components:   RBC 2.93 (*)    Hemoglobin 9.0 (*)    HCT 29.1 (*)    Platelets 126 (*)    All other components within normal limits  TROPONIN T, HIGH SENSITIVITY - Abnormal; Notable for the following components:   Troponin T High Sensitivity 90 (*)    All other components within normal limits  TROPONIN T, HIGH SENSITIVITY - Abnormal; Notable for the following components:   Troponin T High Sensitivity 96 (*)    All other components within normal limits  CULTURE, BLOOD (ROUTINE X 2)  CULTURE, BLOOD (ROUTINE X 2)  LACTIC ACID, PLASMA  PROTIME-INR  LIPASE, BLOOD  LACTIC ACID, PLASMA  LACTIC ACID, PLASMA  URINALYSIS, W/ REFLEX TO CULTURE (INFECTION SUSPECTED)     MDM  Given antiemetics and IV pain medication.  Clinical Course as of 05/01/24 1532  Sat May 01, 2024  0926 On reevaluation states that his pain is feeling much better is not well-controlled.  Lab work with no significant leukocytosis.  Does have anemia but hemoglobin appears to be stable and at his baseline.  Chronic thrombocytopenia that also appears stable.  Normal lactic acid at 1.4.  Lipase within normal limits.  Creatinine is at his baseline.  Potassium is 3.8.  No emergent reason for dialysis at this time.  T. bili normal at 0.7.  Mildly elevated alk phos that appears to be chronic.  Initial  troponin is mildly elevated at 90, no active chest pain at this time but getting a second troponin, likely elevated in the setting of ESRD.  CT scan with findings of hydronephrosis but no signs of biliary obstruction.  No obvious abscess.  No obvious kidney stone or etiology for his significant right sided hydronephrosis.  On reevaluation his pain  has improved.  Does not really produce any urine so unable to get a urine sample at this time.  Will reach out to urology. [SM]  [phone] Paging urology Dr. Carolee to discuss hydronephrosis and on ongoing pain [SM]  1010 Discussed hydronephrosis with Dr. Carolee with hydronephrosis with minimal urine output given that he is a dialysis patient.  Recommended pain control and outpatient follow-up.  Did not recommend any further inpatient hospitalization or workup at this time. [SM]  1137 Unable to get dialysis at [place] today.  Discussed with dialysis team at this hospital with Dr. Lazarus and plan for short session of dialysis and then to be reevaluated in the emergency department.  Plan to be discharged with outpatient follow-up for nephrology. [SM]  1501 Rapid response called from hemodialysis.  Patient spiked a fever and became tachycardic.  Concern for sepsis.  Blood cultures have already been obtained.  Will repeat a lactic acid although initial lactic acid was normal at 1.4.  Felt that 30 cc/kg of IV fluids may be detrimental given his history of dialysis, will do 500 bolus and reevaluate.  Given antipyretics.  Started on broad-spectrum antibiotics to treat unknown source.  Given concern for percutaneous chole drain infectious reaching out to interventional radiology and plan to admit to the hospitalist [SM]  1510 Discussed with interventional radiology, Dr. Jenna, feels that it is just mucus from the track and if he has a drain that is draining that they will see him in the morning and no need for emergent procedure.  Consulted hospitalist for admission for sepsis  [SM]    Clinical Course User Index [SM] Suzanne Kirsch, MD     PROCEDURES:  Critical Care performed: yes  .Critical Care  Performed by: Suzanne Kirsch, MD Authorized by: Suzanne Kirsch, MD   Critical care provider statement:    Critical care time (minutes):  45   Critical care time was exclusive of:  Separately billable procedures and treating other patients   Critical care was necessary to treat or prevent imminent or life-threatening deterioration of the following conditions:  Sepsis   Critical care was time spent personally by me on the following activities:  Development of treatment plan with patient or surrogate, discussions with consultants, evaluation of patient's response to treatment, examination of patient, ordering and review of laboratory studies, ordering and review of radiographic studies, ordering and performing treatments and interventions, pulse oximetry, re-evaluation of patient's condition and review of old charts   Care discussed with: admitting provider     Patient's presentation is most consistent with acute presentation with potential threat to life or bodily function.   MEDICATIONS ORDERED IN ED: Medications  Chlorhexidine  Gluconate Cloth 2 % PADS 6 each (has no administration in time range)  lactated ringers  infusion (has no administration in time range)  ceFEPIme  (MAXIPIME ) 2 g in sodium chloride  0.9 % 100 mL IVPB (has no administration in time range)  metroNIDAZOLE  (FLAGYL ) IVPB 500 mg (has no administration in time range)  vancomycin  (VANCOCIN ) IVPB 1000 mg/200 mL premix (has no administration in time range)  sodium chloride  0.9 % bolus 500 mL (has no administration in time range)  fentaNYL  (SUBLIMAZE ) injection 50 mcg (50 mcg Intravenous Given 05/01/24 0749)  ondansetron  (ZOFRAN ) injection 4 mg (4 mg Intravenous Given 05/01/24 0749)  iohexol  (OMNIPAQUE ) 300 MG/ML solution 100 mL (100 mLs Intravenous Contrast Given 05/01/24 0805)   oxyCODONE -acetaminophen  (PERCOCET/ROXICET) 5-325 MG per tablet 1 tablet (1 tablet Oral Given 05/01/24 1044)  acetaminophen  (TYLENOL ) tablet  1,000 mg (1,000 mg Oral Given 05/01/24 1512)  acetaminophen  (TYLENOL ) suppository 650 mg (650 mg Rectal Given 05/01/24 1519)    FINAL CLINICAL IMPRESSION(S) / ED DIAGNOSES   Final diagnoses:  Right upper quadrant abdominal pain  Hydronephrosis, unspecified hydronephrosis type     Rx / DC Orders   ED Discharge Orders          Ordered    oxyCODONE -acetaminophen  (PERCOCET) 5-325 MG tablet  Every 4 hours PRN        05/01/24 1153    ondansetron  (ZOFRAN -ODT) 4 MG disintegrating tablet  Every 8 hours PRN        05/01/24 1153             Note:  This document was prepared using Dragon voice recognition software and may include unintentional dictation errors.   Suzanne Kirsch, MD 05/01/24 1154    Suzanne Kirsch, MD 05/01/24 1505    Suzanne Kirsch, MD 05/01/24 1511    Suzanne Kirsch, MD 05/01/24 [id number]

## 2024-05-01 NOTE — Assessment & Plan Note (Signed)
 Labs A1c was 4.4.  Insulin  SSI with at bedtime coverage is not indicated at this time

## 2024-05-02 DIAGNOSIS — R6510 Systemic inflammatory response syndrome (SIRS) of non-infectious origin without acute organ dysfunction: Secondary | ICD-10-CM | POA: Diagnosis not present

## 2024-05-02 LAB — CBC
HCT: 27.7 % — ABNORMAL LOW (ref 39.0–52.0)
Hemoglobin: 8.5 g/dL — ABNORMAL LOW (ref 13.0–17.0)
MCH: 31 pg (ref 26.0–34.0)
MCHC: 30.7 g/dL (ref 30.0–36.0)
MCV: 101.1 fL — ABNORMAL HIGH (ref 80.0–100.0)
Platelets: 95 K/uL — ABNORMAL LOW (ref 150–400)
RBC: 2.74 MIL/uL — ABNORMAL LOW (ref 4.22–5.81)
RDW: 15.1 % (ref 11.5–15.5)
WBC: 14 K/uL — ABNORMAL HIGH (ref 4.0–10.5)
nRBC: 0 % (ref 0.0–0.2)

## 2024-05-02 LAB — BASIC METABOLIC PANEL WITH GFR
Anion gap: 13 (ref 5–15)
BUN: 33 mg/dL — ABNORMAL HIGH (ref 8–23)
CO2: 25 mmol/L (ref 22–32)
Calcium: 8.3 mg/dL — ABNORMAL LOW (ref 8.9–10.3)
Chloride: 100 mmol/L (ref 98–111)
Creatinine, Ser: 5.31 mg/dL — ABNORMAL HIGH (ref 0.61–1.24)
GFR, Estimated: 11 mL/min — ABNORMAL LOW (ref >60)
Glucose, Bld: 125 mg/dL — ABNORMAL HIGH (ref 70–99)
Potassium: 3.9 mmol/L (ref 3.5–5.1)
Sodium: 138 mmol/L (ref 135–145)

## 2024-05-02 LAB — PROCALCITONIN: Procalcitonin: 100 ng/mL

## 2024-05-02 MED ORDER — CLOPIDOGREL BISULFATE 75 MG PO TABS
75.0000 mg | ORAL_TABLET | Freq: Every day | ORAL | Status: DC
  Administered 2024-05-02 – 2024-05-05 (×4): 75 mg via ORAL
  Filled 2024-05-02 (×5): qty 1

## 2024-05-02 MED ORDER — OXYCODONE HCL 5 MG PO TABS
5.0000 mg | ORAL_TABLET | ORAL | Status: DC | PRN
  Administered 2024-05-02 – 2024-05-06 (×6): 10 mg via ORAL
  Filled 2024-05-02 (×5): qty 2

## 2024-05-02 MED ORDER — ORAL CARE MOUTH RINSE: 15.0000 mL | OROMUCOSAL | Status: DC | PRN

## 2024-05-02 MED ORDER — GABAPENTIN 300 MG PO CAPS
600.0000 mg | ORAL_CAPSULE | Freq: Two times a day (BID) | ORAL | Status: DC
  Administered 2024-05-02 – 2024-05-05 (×7): 600 mg via ORAL
  Filled 2024-05-02 (×8): qty 2

## 2024-05-02 MED ORDER — ASPIRIN 81 MG PO TBEC
81.0000 mg | DELAYED_RELEASE_TABLET | Freq: Every day | ORAL | Status: DC
  Administered 2024-05-02 – 2024-05-05 (×4): 81 mg via ORAL
  Filled 2024-05-02 (×5): qty 1

## 2024-05-02 MED ORDER — ISOSORBIDE MONONITRATE ER 60 MG PO TB24
120.0000 mg | ORAL_TABLET | Freq: Every day | ORAL | Status: DC
  Administered 2024-05-03 – 2024-05-05 (×3): 120 mg via ORAL
  Filled 2024-05-02 (×2): qty 2
  Filled 2024-05-02: qty 4
  Filled 2024-05-02: qty 2

## 2024-05-02 MED ORDER — TAMSULOSIN HCL 0.4 MG PO CAPS
0.4000 mg | ORAL_CAPSULE | Freq: Every day | ORAL | Status: DC
  Administered 2024-05-02 – 2024-05-05 (×4): 0.4 mg via ORAL
  Filled 2024-05-02 (×5): qty 1

## 2024-05-02 NOTE — Plan of Care (Signed)

## 2024-05-02 NOTE — Consult Note (Addendum)
 WOC team consulted for NPWT L stump.  Patient had an I&D with NPWT placement L stump 11/10 by Dr. Lanis.  Was seen last by [facility] 11/13 and placed on [facility] vac for discharge.    Secure chat sent to primary nurse that patient should be placed on a [facility] hospital NPWT unit using current dressing. If this is not possible existing NPWT dressing should be removed and saline wet to dry dressing placed until WOC RN on campus 05/03/2024 can place new dressing. Per policy patients are not to remain on home NPWT unit while inpatient and if NPWT not operational for 2 hours black foam NPWT dressing is to be removed and saline wet to dry dressing placed.    WOC team will follow as above.   Thank you,    Powell Bar MSN, RN-BC, [employer]

## 2024-05-02 NOTE — Progress Notes (Signed)
 Home Wound Vac removed and placed in patient belonging bag at bedside. Hospital Wound Vac delivered to bedside and awaiting canister for placement tomorrow.  Wound irrigated and cleansed with Normal Saline. New Saline Wet to Dry Dressing to [facility] (gauze, ABD, kerlix). Clean Dry and Intact.

## 2024-05-02 NOTE — Progress Notes (Signed)
 PROGRESS NOTE    Phillip [name]  FMW:[id number] DOB: 22-Jan-1956 DOA: 05/01/2024 PCP: [name] LABOR., MD    Brief Narrative:   68 year old male with history of end-stage renal disease on hemodialysis, history of severe aortic stenosis status post TAVR, hyperlipidemia, neuropathy, hypothyroid, recent acute cholecystitis status post percutaneous cholecystostomy tube placement, PAD status post PCI and ambulation with BKA of the left leg, who presents to the ED for chief concerns of right sided abdominal pain.    Assessment & Plan:   Principal Problem:   SIRS (systemic inflammatory response syndrome) (HCC) Active Problems:   Right sided weakness   CAD (coronary artery disease)   Hypothyroidism (acquired)   Essential hypertension   ESRD on dialysis [facility])   Atherosclerosis of native arteries of the extremities with ulceration (HCC)   Diabetes mellitus without complication (HCC)   Hyperlipidemia associated with type 2 diabetes mellitus (HCC)   Pulmonary HTN (HCC)   S/P TAVR (transcatheter aortic valve replacement)   Protein-calorie malnutrition, severe   S/P BKA (below knee amputation) unilateral, left (HCC)   Elevated troponin  SIRS (systemic inflammatory response syndrome) (HCC) Patient met SIRS criteria with fever, Tmax of 102.7, increased respiration rate, increased heart rate Organ involvement likely neurological/altered mentation with possible source of intra-abdominal in setting of cholecystostomy tube versus left BKA Interventional radiology has been consulted by EDP, Dr. Jenna is aware Per EDP, IR, Dr. Jenna states that IR will see the patient Plan: Continue with broad-spectrum IV antibiotics for intra-abdominal coverage.  Ceftriaxone  and metronidazole  for now.  Follow blood cultures, no growth to date.  Maps are maintaining.  No signs of shock physiology.   Right sided weakness Of the right upper and right lower extremity weakness With change in mental  status Unclear how long this has been going on Patient spouse reports that patient is right-handed.  He just had physical therapy so sometimes after physical therapy he does have right-sided weakness.  However normally on the right leg, he is able to move without difficulty CT head wo contrast: Advanced chronic small vessel disease, including age-indeterminate involvement of the right thalamus MRI brain negative Plan: Engage therapy services   CAD (coronary artery disease) Aspirin  Plavix  resumed   ESRD on dialysis [facility]) Had a short run of hemodialysis on 11/29.  Rapid response called for febrile episode.  Next dialysis scheduled for Tuesday.  Will need to ensure no line sepsis before then   Essential hypertension Home regimen   Hypothyroidism (acquired) Home levothyroxine  50 mcg daily resume   Elevated troponin Suspect demand ischemia in setting of elevated heart rate   S/P BKA (below knee amputation) unilateral, left (HCC) Wound VAC in place   Hyperlipidemia associated with type 2 diabetes mellitus (HCC) Home atorvastatin  20 mg daily resumed   Diabetes mellitus without complication (HCC) Labs A1c was 4.4.  Insulin  SSI with at bedtime coverage is not indicated at this time     DVT prophylaxis: SQH Code Status: Full Family Communication: Disposition Plan: Status is: Inpatient Remains inpatient appropriate because: Sepsis   Level of care: Progressive  Consultants:  Nephrology  Procedures:  None  Antimicrobials: Rocephin  Metronidazole    Subjective: Seen and examined.  Sitting up in bed.  No distress.  No complaints.  Objective: Vitals:   05/02/24 0054 05/02/24 0421 05/02/24 0742 05/02/24 1138  BP: (!) 103/56 (!) 101/49 100/64 106/65  Pulse: 88 76 77 84  Resp: 17 17 16    Temp: 98.1 F (36.7 C) 98.6 F (37 C)  97.8 F (36.6 C) 98.4 F (36.9 C)  TempSrc:   Oral   SpO2: 100% 96% 99% 99%  Weight:      Height:        Intake/Output Summary (Last 24  hours) at 05/02/2024 1443 Last data filed at 05/02/2024 1039 Gross per 24 hour  Intake 1726.12 ml  Output 10 ml  Net 1716.12 ml   Filed Weights   05/01/24 0729 05/01/24 1149  Weight: 80.7 kg 83.3 kg    Examination:  General exam: Appears calm and comfortable  Respiratory system: Clear to auscultation. Respiratory effort normal. Cardiovascular system: S1-S2, RRR, no murmurs, no pedal edema Gastrointestinal system:, NT/ND, normal bowel sounds, no cholecystostomy Central nervous system: Alert and oriented. No focal neurological deficits. Extremities: Left BKA with wound VAC Skin: No rashes, lesions or ulcers Psychiatry: Judgement and insight appear normal. Mood & affect appropriate.     Data Reviewed: I have personally reviewed following labs and imaging studies  CBC: Recent Labs  Lab 05/01/24 0731 05/02/24 0550  WBC 8.8 14.0*  NEUTROABS 7.3  --   HGB 9.0* 8.5*  HCT 29.1* 27.7*  MCV 99.3 101.1*  PLT 126* 95*   Basic Metabolic Panel: Recent Labs  Lab 05/01/24 0731 05/02/24 0550  NA 142 138  K 3.8 3.9  CL 100 100  CO2 30 25  GLUCOSE 145* 125*  BUN 32* 33*  CREATININE 4.87* 5.31*  CALCIUM  8.8* 8.3*   GFR: Estimated Creatinine Clearance: 14.6 mL/min (A) (by C-G formula based on SCr of 5.31 mg/dL (H)). Liver Function Tests: Recent Labs  Lab 05/01/24 0731  AST 39  ALT 19  ALKPHOS 238*  BILITOT 0.7  PROT 7.6  ALBUMIN  3.4*   Recent Labs  Lab 05/01/24 0731  LIPASE 41   No results for input(s): AMMONIA in the last 168 hours. Coagulation Profile: Recent Labs  Lab 05/01/24 0731  INR 1.1   Cardiac Enzymes: No results for input(s): CKTOTAL, CKMB, CKMBINDEX, TROPONINI in the last 168 hours. BNP (last 3 results) Recent Labs    07/14/23 1412  PROBNP 11,269*   HbA1C: No results for input(s): HGBA1C in the last 72 hours. CBG: No results for input(s): GLUCAP in the last 168 hours. Lipid Profile: No results for input(s): CHOL, HDL,  LDLCALC, TRIG, CHOLHDL, LDLDIRECT in the last 72 hours. Thyroid  Function Tests: No results for input(s): TSH, T4TOTAL, FREET4, T3FREE, THYROIDAB in the last 72 hours. Anemia Panel: No results for input(s): VITAMINB12, FOLATE, FERRITIN, TIBC, IRON , RETICCTPCT in the last 72 hours. Sepsis Labs: Recent Labs  Lab 05/01/24 0731 05/01/24 1510 05/01/24 1738 05/02/24 0905  PROCALCITON  --   --   --  100.00  LATICACIDVEN 1.4 3.1* 2.3*  --     Recent Results (from the past 240 hours)  Blood Culture (routine x 2)     Status: None (Preliminary result)   Collection Time: 05/01/24  7:44 AM   Specimen: BLOOD RIGHT FOREARM  Result Value Ref Range Status   Specimen Description BLOOD RIGHT FOREARM  Final   Special Requests   Final    BOTTLES DRAWN AEROBIC AND ANAEROBIC Blood Culture adequate volume   Culture   Final    NO GROWTH < 24 HOURS Performed at [facility], [address], [place], [place] [address]    Report Status PENDING  Incomplete  Blood Culture (routine x 2)     Status: None (Preliminary result)   Collection Time: 05/01/24  7:44 AM   Specimen: Right  Antecubital; Blood  Result Value Ref Range Status   Specimen Description RIGHT ANTECUBITAL  Final   Special Requests   Final    BOTTLES DRAWN AEROBIC AND ANAEROBIC Blood Culture adequate volume   Culture   Final    NO GROWTH < 24 HOURS Performed at [facility], [address], [place], [place] [address]    Report Status PENDING  Incomplete         Radiology Studies: MR BRAIN WO CONTRAST Result Date: 05/01/2024 CLINICAL DATA:  Initial evaluation for acute neuro deficit, stroke suspected. EXAM: MRI HEAD WITHOUT CONTRAST TECHNIQUE: Multiplanar, multiecho pulse sequences of the brain and surrounding structures were obtained without intravenous contrast. COMPARISON:  CT from earlier the same day. FINDINGS: Brain: Cerebral volume within normal limits. Patchy and confluent T2/FLAIR  hyperintensity involving the periventricular and deep white matter both cerebral hemispheres as well as the pons, consistent with chronic small vessel ischemic disease, moderate in nature. Few scatter remote lacunar infarcts present about the right hemispheric cerebral white matter, right basal ganglia, thalami, and pons. Few tiny remote cerebellar infarcts noted. No abnormal foci of restricted diffusion to suggest acute or subacute ischemia. Gray-white matter differentiation maintained. No areas of chronic cortical infarction. No acute intracranial hemorrhage. Single punctate chronic microhemorrhage noted within the right thalamus. No mass lesion, midline shift or mass effect. No hydrocephalus or extra-axial fluid collection. Pituitary gland within normal limits. Vascular: Hypoplastic right vertebral artery not well seen. Major intracranial vascular flow voids are otherwise maintained. Skull and upper cervical spine: Craniocervical junction within normal limits. Mildly decreased T1 signal intensity within the visualized bone marrow, nonspecific, but most commonly related to anemia, smoking or obesity. No scalp soft tissue abnormality. Sinuses/Orbits: Globes orbital soft tissues within normal limits. Paranasal sinuses are largely clear. No significant mastoid effusion. Other: None. IMPRESSION: 1. No acute intracranial abnormality. 2. Moderate chronic microvascular ischemic disease with a few scattered remote lacunar infarcts involving the right hemispheric cerebral white matter, right basal ganglia, thalami, and pons. Electronically Signed   By: Morene Hoard M.D.   On: 05/01/2024 21:16   CT HEAD WO CONTRAST ( ) Result Date: 05/01/2024 EXAM: CT HEAD WITHOUT CONTRAST 05/01/2024 04:27:00 PM TECHNIQUE: CT of the head was performed without the administration of intravenous contrast. Automated exposure control, iterative reconstruction, and/or weight based adjustment of the mA/kV was utilized to reduce the  radiation dose to as low as reasonably achievable. COMPARISON: None available. CLINICAL HISTORY: 68 year old male. Neuro deficit, acute, stroke suspected. FINDINGS: BRAIN AND VENTRICLES: Normal brain volume for age. No acute hemorrhage. No hydrocephalus. No extra-axial collection. No mass effect or midline shift. Asymmetric moderate patchy and scattered bilateral cerebral white matter hypodensity. Age indeterminate hypodensity in the medial right thalamus on series 2 image 14. Similar small but circumscribed hypodensity in the right caudate nucleus appears to be a chronic lacunar infarct (coronal image 29). Moderate patchy hypodensity in the left paracentral pons (series 2 image 9). No suspicious intracranial vascular hyperdensity. ORBITS: No acute abnormality. SINUSES: Paranasal sinuses, middle ears and mastoids are well aerated. SOFT TISSUES AND SKULL: Calcified scalp vessel atherosclerosis. No gaze deviation. Calcified atherosclerosis at the skull base. No acute soft tissue abnormality. No skull fracture. IMPRESSION: 1. Advanced chronic small vessel disease, including age indeterminate involvement of the right thalamus. Electronically signed by: Helayne Hurst MD 05/01/2024 04:49 PM EST RP Workstation: [id number]   CT ABDOMEN PELVIS W CONTRAST Result Date: 05/01/2024 EXAM: CT ABDOMEN AND PELVIS WITH CONTRAST 05/01/2024 08:12:07 AM  TECHNIQUE: CT of the abdomen and pelvis was performed with the administration of 100 mL of iohexol  (OMNIPAQUE ) 300 MG/ML solution. Multiplanar reformatted images are provided for review. Automated exposure control, iterative reconstruction, and/or weight-based adjustment of the mA/kV was utilized to reduce the radiation dose to as low as reasonably achievable. COMPARISON: CT of the abdomen and pelvis dated 02/15/2024. CLINICAL HISTORY: Abdominal pain, acute, nonlocalized; perc chole drain, RUQ pain, purulent drainage, eval for infection. FINDINGS: LOWER CHEST: There is mild atelectasis  present dependently within the lower lobes bilaterally. The patient is again noted to be status post aortic valve repair. LIVER: The liver is unremarkable. GALLBLADDER AND BILE DUCTS: A cholecystostomy tube remains in place. No biliary ductal dilatation. SPLEEN: No acute abnormality. PANCREAS: No acute abnormality. ADRENAL GLANDS: No acute abnormality. KIDNEYS, URETERS AND BLADDER: There is moderate right hydroureteronephrosis. No stones in the kidneys or ureters. No perinephric or periureteral stranding. Urinary bladder is unremarkable. GI AND BOWEL: Stomach demonstrates no acute abnormality. There is no bowel obstruction. There are few scattered colonic diverticula. The patient is status post appendectomy. PERITONEUM AND RETROPERITONEUM: No ascites. No free air. VASCULATURE: The abdominal aorta is normal in caliber and demonstrates moderate calcific atheromatous disease. LYMPH NODES: No lymphadenopathy. REPRODUCTIVE ORGANS: No acute abnormality. BONES AND SOFT TISSUES: There is bilateral spondylolysis and grade 1 anterolisthesis at L5-S1. No focal soft tissue abnormality. IMPRESSION: 1. Moderate right hydroureteronephrosis without stones or perinephric/periureteral stranding. Correlate clinically for obstructive etiology; consider urologic consultation if clinically indicated. 2. Cholecystostomy tube in place. No biliary ductal dilatation. Electronically signed by: Evalene Coho MD 05/01/2024 08:26 AM EST RP Workstation: [id number]   DG Chest Port 1 View Result Date: 05/01/2024 CLINICAL DATA:  Sepsis. EXAM: PORTABLE CHEST 1 VIEW COMPARISON:  11/11/2023 FINDINGS: The heart size and mediastinal contours are within normal limits. Prior CABG and TAVR noted. Right jugular dual-lumen central venous dialysis catheter remains in appropriate position. Both lungs are clear. IMPRESSION: No active disease. Electronically Signed   By: Norleen DELENA Kil M.D.   On: 05/01/2024 07:52        Scheduled Meds:  atorvastatin    20 mg Oral Daily   B-complex with vitamin C  1 tablet Oral Daily   Chlorhexidine  Gluconate Cloth  6 each Topical Q0600   heparin   5,000 Units Subcutaneous Q8H   levothyroxine   50 mcg Oral Q0600   sevelamer  carbonate  800 mg Oral TID WC   Continuous Infusions:  cefTRIAXone  (ROCEPHIN )  IV     metronidazole  500 mg (05/02/24 0317)     LOS: 1 day      Calvin KATHEE Robson, MD [facility]   If 7PM-7AM, please contact night-coverage  05/02/2024, 2:43 PM

## 2024-05-02 NOTE — Consult Note (Signed)
 [facility]  CONSULT NOTE    Date: 05/02/2024                  Patient Name:  Phillip [name]  MRN: [id number]  DOB: 20-May-1956  Age / Sex: 68 y.o., male         PCP: [name] LABOR., MD                 Service Requesting Consult: [facility]                 Reason for Consult: End stage renal disease on hemodialysis            History of Present Illness: [name] is a 68 y.o.  male with past medical history of hyperlipidemia, neuropathy, recent acute cholecystitis with percutaneous tube insertion, PAD with PCI, and end-stage renal disease on hemodialysis, who was admitted to [facility] on 05/01/2024 for SIRS (systemic inflammatory response syndrome) (HCC) [R65.10] Right upper quadrant abdominal pain [R10.11] Hydronephrosis, unspecified hydronephrosis type [N13.30]  Patient receives outpatient dialysis treatments at [facility] on a TTS schedule, supervised by [facility] physicians. He presented to ED with right abd pain from wound vac. We were initially consulted to provide dialysis yesterday due to patient missing his scheduled chair time. During dialysis, HD RN activated rapid response due to sudden onset chills, fever and tachycardia. Blood cultures obtained.   Patient seen sitting up in bed. No family present. Room air. States he feels well and would like to discharge.    Medications: Outpatient medications: Medications Prior to Admission  Medication Sig Dispense Refill Last Dose/Taking   acetaminophen  (TYLENOL ) 325 MG tablet Take 2 tablets (650 mg total) by mouth every 6 (six) hours as needed for mild pain (pain score 1-3) or fever (or Fever >/= 101).   Unknown   aspirin  EC 81 MG tablet Take 1 tablet (81 mg total) by mouth daily. Swallow whole.   04/30/2024   atorvastatin  (LIPITOR) 20 MG tablet Take 1 tablet (20 mg total) by mouth daily. 30 tablet 0 04/30/2024   B Complex-C (B-COMPLEX WITH VITAMIN C) tablet Take 1 tablet by mouth daily. 30 tablet 0  04/30/2024   carvedilol  (COREG ) 6.25 MG tablet Take 6.125 mg (1 tablet) in the morning and take 3.125 mg (0.5 tablet) in the evening. 135 tablet 3 04/30/2024   cephALEXin  (KEFLEX ) 500 MG capsule Take 500 mg by mouth 3 (three) times daily.   04/30/2024   clopidogrel  (PLAVIX ) 75 MG tablet Take 75 mg by mouth daily.   04/30/2024   gabapentin  (NEURONTIN ) 600 MG tablet Take 600 mg by mouth 2 (two) times daily.   04/30/2024   isosorbide  mononitrate (IMDUR ) 120 MG 24 hr tablet Take 120 mg by mouth daily.   04/30/2024   levothyroxine  (SYNTHROID ) 50 MCG tablet Take 1 tablet (50 mcg total) by mouth daily. 30 tablet 0 04/30/2024   lidocaine  (LIDODERM ) 5 % Place 1 patch onto the skin daily. Remove & Discard patch within 12 hours or as directed by MD 30 patch 0 Past Week   nitroGLYCERIN  (NITROSTAT ) 0.4 MG SL tablet Place 1 tablet (0.4 mg total) under the tongue every 5 (five) minutes as needed for chest pain. 25 tablet 0 Unknown   oxyCODONE  (OXY IR/ROXICODONE ) 5 MG immediate release tablet Take 1 tablet (5 mg total) by mouth every 6 (six) hours as needed for severe pain (pain score 7-10). 20 tablet 0 Unknown   sevelamer  carbonate (RENVELA ) 800  MG tablet Take 1 tablet (800 mg total) by mouth 3 (three) times daily with meals. 90 tablet 0 04/30/2024   tamsulosin  (FLOMAX ) 0.4 MG CAPS capsule Take 0.4 mg by mouth daily.   04/30/2024   vitamin D3 (CHOLECALCIFEROL ) 25 MCG tablet Take 1 tablet (1,000 Units total) by mouth daily. 30 tablet 0 04/30/2024    Current medications: Current Facility-Administered Medications  Medication Dose Route Frequency Provider Last Rate Last Admin   acetaminophen  (TYLENOL ) tablet 650 mg  650 mg Oral Q6H PRN Cox, Amy N, DO   650 mg at 05/02/24 1017   Or   acetaminophen  (TYLENOL ) suppository 650 mg  650 mg Rectal Q6H PRN Cox, Amy N, DO       atorvastatin  (LIPITOR) tablet 20 mg  20 mg Oral Daily Cox, Amy N, DO   20 mg at 05/02/24 1017   B-complex with vitamin C tablet 1 tablet  1 tablet  Oral Daily Cox, Amy N, DO   1 tablet at 05/02/24 1016   cefTRIAXone  (ROCEPHIN ) 2 g in sodium chloride  0.9 % 100 mL IVPB  2 g Intravenous Q24H Cox, Amy N, DO       Chlorhexidine  Gluconate Cloth 2 % PADS 6 each  6 each Topical Q0600 Druscilla Bald, NP   6 each at 05/02/24 0549   heparin  injection 5,000 Units  5,000 Units Subcutaneous Q8H Cox, Amy N, DO   5,000 Units at 05/02/24 0537   hydrALAZINE  (APRESOLINE ) injection 5 mg  5 mg Intravenous Q6H PRN Cox, Amy N, DO       levothyroxine  (SYNTHROID ) tablet 50 mcg  50 mcg Oral Q0600 Cox, Amy N, DO   50 mcg at 05/02/24 0536   metroNIDAZOLE  (FLAGYL ) IVPB 500 mg  500 mg Intravenous Q12H Cox, Amy N, DO 100 mL/hr at 05/02/24 0317 500 mg at 05/02/24 9682   ondansetron  (ZOFRAN ) tablet 4 mg  4 mg Oral Q6H PRN Cox, Amy N, DO       Or   ondansetron  (ZOFRAN ) injection 4 mg  4 mg Intravenous Q6H PRN Cox, Amy N, DO       Oral care mouth rinse  15 mL Mouth Rinse PRN Mansy, Madison LABOR, MD       senna-docusate (Senokot-S) tablet 1 tablet  1 tablet Oral QHS PRN Cox, Amy N, DO       sevelamer  carbonate (RENVELA ) tablet 800 mg  800 mg Oral TID WC Cox, Amy N, DO   800 mg at 05/02/24 1016      Allergies: No Known Allergies    Past Medical History: Past Medical History:  Diagnosis Date   Abdominopelvic abscess (HCC) 09/29/2018   Abscess of appendix 09/22/2018   Acquired spondylolisthesis of lumbosacral region 03/20/2020   Acute cholecystitis 11/15/2023   Anemia of chronic disease 08/18/2019   Anemia, chronic disease 08/18/2019   Aortic atherosclerosis    Aortic stenosis    a.) TTE 09/15/2018: mild-mod (MPG 19); b.) TTE 03/21/2021: mild-mod (MPG 14.3); c.) TTE 04/24/2022: mild- mod (MPG 15)   Appendicitis 09/08/2018   Asthma    Atherosclerosis of native arteries of the extremities with ulceration (HCC) 02/24/2018   Formatting of this note might be different from the original.  Last Assessment & Plan:   His ABIs today are 1.07 on the right and 0.99 on the left  with biphasic waveforms.  Although these pressures may be somewhat elevated from medial calcification, his flow does appear to be reasonably good.  His left ABI was 0.58  prior to intervention.  At this point, we will stretch out his follow-up and see hi   Benign prostatic hyperplasia without lower urinary tract symptoms 01/14/2018   Bilateral lower extremity edema 11/04/2018   Bradycardia 03/25/2018   CAD (coronary artery disease) 2003   a.) s/p 4v CABG 2003   Choledocholithiasis 11/12/2023   Chronic combined systolic and diastolic CHF (congestive heart failure) (HCC) 07/26/2015   a.) TTE 09/15/2018: EF 50-55%, mod LVH, RVE, BAE, mild-mod TR, AoV sclerosis, G1DD; b.) MPI 10/27/2019: EF <30%; c.) TTE 03/21/2021: EF 35-40%, post AK, inf HK, mod LVH, mod red RVSF, mod LAE, mod Aov sclerosis, G2DD; c.) TTE 04/24/2022: EF 35-40%, post AK, glok HK, mod LVE, mod red RVSF, mild-mod MR, Aov sclerosis, G3DD   Chronic idiopathic constipation 11/02/2018   Chronic low back pain without sciatica 12/02/2018   Chronic pain of right hip 03/31/2020   Coronary artery disease    Diabetes mellitus without complication (HCC)    Dysphagia 07/26/2015   Encounter for long-term (current) use of insulin  (HCC) 09/27/2020   Erectile dysfunction 07/14/2017   ESRD on dialysis (HCC) 11/15/2023   Essential hypertension 02/24/2018   Gangrene of toe of left foot (HCC) 01/14/2024   Hematuria 07/27/2015   History of marijuana use    Hyperlipidemia 07/26/2015   Hyperlipidemia associated with type 2 diabetes mellitus (HCC) 02/18/2020   Hypertension    Hypothyroidism (acquired) 11/14/2015   Insomnia 11/02/2018   Left below-knee amputee (HCC) 02/05/2024   Osseous and subluxation stenosis of intervertebral foramina of lumbar region 03/20/2020   Osteoarthritis 10/06/2019   Pancytopenia (HCC) 08/12/2023   Peripheral vascular disease    Personal history of tobacco use, presenting hazards to health 09/27/2020   Polyneuropathy in  diabetes (HCC) 05/07/2019   Primary osteoarthritis involving multiple joints 10/06/2019   Primary osteoarthritis of right hip 02/21/2020   Protein-calorie malnutrition, severe 02/07/2024   Pulmonary HTN (HCC)    a.) TTE 05/12/2019: PASP 71; b.) TTE 09/27/2020: PASP >70; c.) TTE 03/21/2021: RVSP 43; d.) TTE 04/24/2022: RVSP 80.6   Puncture wound of right hip 04/02/2018   S/P CABG x 4 2003   S/P TAVR (transcatheter aortic valve replacement) 01/20/2024   TAVR with a 29 mm Edwards Sapien 3 Ultra Resilia THV via the TF by Dr. Wendel and Dr. Shyrl   Sleep apnea 07/26/2015   a.) does not require nocturnal PAP therapy   Subacute osteomyelitis of left foot (HCC) 03/25/2018   Thrombocytopenia    Type 2 diabetes mellitus with hyperlipidemia (HCC) 07/26/2015   Vitamin B12 deficiency 06/16/2019   Vitamin D  deficiency 05/07/2018     Past Surgical History: Past Surgical History:  Procedure Laterality Date   AMPUTATION Left 01/14/2024   Procedure: AMPUTATION, FOOT, PARTIAL;  Surgeon: Serene Gaile ORN, MD;  Location: [facility] OR;  Service: Vascular;  Laterality: Left;   AMPUTATION Left 01/30/2024   Procedure: AMPUTATION BELOW KNEE;  Surgeon: Serene Gaile ORN, MD;  Location: [facility] OR;  Service: Vascular;  Laterality: Left;   AMPUTATION TOE Left 03/13/2018   Procedure: AMPUTATION TOE-MPJ;  Surgeon: Ashley Soulier, DPM;  Location: [facility] ORS;  Service: Podiatry;  Laterality: Left;   ANGIOPLASTY     APPLICATION OF WOUND VAC Left 04/12/2024   Procedure: APPLICATION, WOUND VAC;  Surgeon: Lanis Fonda BRAVO, MD;  Location: [facility] OR;  Service: Vascular;  Laterality: Left;   AV FISTULA PLACEMENT Left 08/20/2023   Procedure: ARTERIOVENOUS (AV) FISTULA CREATION;  Surgeon: Gretta Lonni PARAS, MD;  Location: [facility] OR;  Service: Vascular;  Laterality: Left;   CARDIAC CATHETERIZATION     CORONARY ARTERY BYPASS GRAFT N/A 2003   INCISION AND DRAINAGE OF WOUND Left 04/12/2024   Procedure: IRRIGATION AND DEBRIDEMENT WOUND;   Surgeon: Lanis Fonda BRAVO, MD;  Location: [facility] OR;  Service: Vascular;  Laterality: Left;   INTRAOPERATIVE TRANSTHORACIC ECHOCARDIOGRAM N/A 01/20/2024   Procedure: ECHOCARDIOGRAM, TRANSTHORACIC;  Surgeon: Wendel Lurena POUR, MD;  Location: [facility] INVASIVE CV LAB;  Service: Cardiovascular;  Laterality: N/A;   IR CATHETER TUBE CHANGE  12/31/2023   IR DIALY SHUNT INTRO NEEDLE/INTRACATH INITIAL W/IMG LEFT Left 02/09/2024   IR EXCHANGE BILIARY DRAIN  02/03/2024   IR EXCHANGE BILIARY DRAIN  02/17/2024   IR EXCHANGE BILIARY DRAIN  03/05/2024   IR FLUORO GUIDE CV LINE RIGHT  08/18/2023   IR PERC CHOLECYSTOSTOMY  11/12/2023   IR RADIOLOGIST EVAL & MGMT  12/31/2023   IR TUNNELED CENTRAL VENOUS CATH PLC W IMG  02/23/2024   IR US  GUIDE VASC ACCESS RIGHT  08/18/2023   LAPAROSCOPIC APPENDECTOMY N/A 09/08/2018   Procedure: APPENDECTOMY LAPAROSCOPIC;  Surgeon: Jordis Laneta FALCON, MD;  Location: [facility] ORS;  Service: General;  Laterality: N/A;   LOWER EXTREMITY ANGIOGRAPHY Left 03/02/2018   Procedure: LOWER EXTREMITY ANGIOGRAPHY;  Surgeon: Marea Selinda RAMAN, MD;  Location: [facility] INVASIVE CV LAB;  Service: Cardiovascular;  Laterality: Left;   LOWER EXTREMITY ANGIOGRAPHY Left 09/30/2022   Procedure: Lower Extremity Angiography;  Surgeon: Marea Selinda RAMAN, MD;  Location: [facility] INVASIVE CV LAB;  Service: Cardiovascular;  Laterality: Left;   LOWER EXTREMITY ANGIOGRAPHY Left 12/26/2023   Procedure: Lower Extremity Angiography;  Surgeon: Marea Selinda RAMAN, MD;  Location: [facility] INVASIVE CV LAB;  Service: Cardiovascular;  Laterality: Left;   RIGHT/LEFT HEART CATH AND CORONARY/GRAFT ANGIOGRAPHY N/A 08/01/2023   Procedure: RIGHT/LEFT HEART CATH AND CORONARY/GRAFT ANGIOGRAPHY;  Surgeon: Wendel Lurena POUR, MD;  Location: [facility];  Service: Cardiovascular;  Laterality: N/A;   ROTATOR CUFF REPAIR Left    TRANSMETATARSAL AMPUTATION Left 01/16/2024   Procedure: AMPUTATION, FOOT, TRANSMETATARSAL;  Surgeon: Serene Gaile ORN, MD;  Location: [facility] OR;   Service: Vascular;  Laterality: Left;     Family History: Family History  Problem Relation Age of Onset   Hyperlipidemia Mother    Hypertension Mother    Diabetes Mother    Heart attack Brother    Heart disease Brother    Lung disease Father      Social History: Social History   Socioeconomic History   Marital status: Married    Spouse name: Not on file   Number of children: Not on file   Years of education: Not on file   Highest education level: Not on file  Occupational History   Not on file  Tobacco Use   Smoking status: Former    Current packs/day: 0.00    Average packs/day: 0.5 packs/day for 25.0 years (12.5 ttl pk-yrs)    Types: Cigarettes    Start date: 79    Quit date: 2003    Years since quitting: 22.9   Smokeless tobacco: Never  Vaping Use   Vaping status: Never Used  Substance and Sexual Activity   Alcohol use: Not Currently    Comment: 2003   Drug use: Never   Sexual activity: Not Currently  Other Topics Concern   Not on file  Social History Narrative   Not on file   Social Drivers of Health   Financial Resource Strain: Low Risk (09/26/2020)   Received from [facility]   Overall Financial Resource Strain (CARDIA)    Difficulty of Paying Living Expenses: Not hard at all  Food Insecurity: No Food Insecurity (05/02/2024)   Hunger Vital Sign    Worried About Running Out of Food in the Last Year: Never true    Ran Out of Food in the Last Year: Never true  Transportation Needs: No Transportation Needs (05/02/2024)   PRAPARE - Administrator, Civil Service (Medical): No    Lack of Transportation (Non-Medical): No  Physical Activity: Inactive (09/26/2020)   Received from [facility]   Exercise Vital Sign    On average, how many days per week do you engage in moderate to strenuous exercise (like a brisk walk)?: 0 days    On average, how many minutes do you engage in exercise at this level?: 0 min  Stress: No Stress Concern  Present (09/26/2020)   Received from [facility] of Occupational Health - Occupational Stress Questionnaire    Feeling of Stress : Not at all  Social Connections: Moderately Integrated (05/02/2024)   Social Connection and Isolation Panel    Frequency of Communication with Friends and Family: More than three times a week    Frequency of Social Gatherings with Friends and Family: Twice a week    Attends Religious Services: More than 4 times per year    Active Member of [employer] or Organizations: No    Attends Banker Meetings: Never    Marital Status: Married  Catering Manager Violence: Not At Risk (05/02/2024)   Humiliation, Afraid, Rape, and Kick questionnaire    Fear of Current or Ex-Partner: No    Emotionally Abused: No    Physically Abused: No    Sexually Abused: No     Review of Systems: Review of Systems  Constitutional:  Negative for chills, fever and malaise/fatigue.  HENT:  Negative for congestion, sore throat and tinnitus.   Eyes:  Negative for blurred vision and redness.  Respiratory:  Negative for cough, shortness of breath and wheezing.   Cardiovascular:  Negative for chest pain, palpitations, claudication and leg swelling.  Gastrointestinal:  Positive for abdominal pain. Negative for blood in stool, diarrhea, nausea and vomiting.  Genitourinary:  Negative for flank pain, frequency and hematuria.  Musculoskeletal:  Negative for back pain, falls and myalgias.  Skin:  Negative for rash.  Neurological:  Negative for dizziness, weakness and headaches.  Endo/Heme/Allergies:  Does not bruise/bleed easily.  Psychiatric/Behavioral:  Negative for depression. The patient is not nervous/anxious and does not have insomnia.     Vital Signs: Blood pressure 100/64, pulse 77, temperature 97.8 F (36.6 C), temperature source Oral, resp. rate 16, height 6' (1.829 m), weight 83.3 kg, SpO2 99%.  Weight trends: Filed Weights   05/01/24 0729 05/01/24  1149  Weight: 80.7 kg 83.3 kg    Physical Exam: General: NAD  Head: Normocephalic, atraumatic.   Eyes: Anicteric  Lungs:  Clear to auscultation  Heart: Regular rate and rhythm  Abdomen:  Soft, nontender  Extremities:  No peripheral edema.Lt BKA  Neurologic: Awake and alert  Skin: NPWV-Lateral left BKA  Access: Rt IJ permcath     Lab results: Basic Metabolic Panel: Recent Labs  Lab 05/01/24 0731 05/02/24 0550  NA 142 138  K 3.8 3.9  CL 100 100  CO2 30 25  GLUCOSE 145* 125*  BUN 32* 33*  CREATININE 4.87* 5.31*  CALCIUM  8.8* 8.3*    Liver  Function Tests: Recent Labs  Lab 05/01/24 0731  AST 39  ALT 19  ALKPHOS 238*  BILITOT 0.7  PROT 7.6  ALBUMIN  3.4*   Recent Labs  Lab 05/01/24 0731  LIPASE 41   No results for input(s): AMMONIA in the last 168 hours.  CBC: Recent Labs  Lab 05/01/24 0731 05/02/24 0550  WBC 8.8 14.0*  NEUTROABS 7.3  --   HGB 9.0* 8.5*  HCT 29.1* 27.7*  MCV 99.3 101.1*  PLT 126* 95*    Cardiac Enzymes: No results for input(s): CKTOTAL, CKMB, CKMBINDEX, TROPONINI in the last 168 hours.  BNP: Invalid input(s): POCBNP  CBG: No results for input(s): GLUCAP in the last 168 hours.  Microbiology: Results for orders placed or performed during the hospital encounter of 05/01/24  Blood Culture (routine x 2)     Status: None (Preliminary result)   Collection Time: 05/01/24  7:44 AM   Specimen: BLOOD RIGHT FOREARM  Result Value Ref Range Status   Specimen Description BLOOD RIGHT FOREARM  Final   Special Requests   Final    BOTTLES DRAWN AEROBIC AND ANAEROBIC Blood Culture adequate volume   Culture   Final    NO GROWTH < 24 HOURS Performed at [facility], [address], [place], [place] [address]    Report Status PENDING  Incomplete  Blood Culture (routine x 2)     Status: None (Preliminary result)   Collection Time: 05/01/24  7:44 AM   Specimen: Right Antecubital; Blood  Result Value Ref Range Status    Specimen Description RIGHT ANTECUBITAL  Final   Special Requests   Final    BOTTLES DRAWN AEROBIC AND ANAEROBIC Blood Culture adequate volume   Culture   Final    NO GROWTH < 24 HOURS Performed at [facility], [address] [place], [place] [address]    Report Status PENDING  Incomplete    Coagulation Studies: Recent Labs    05/01/24 0731  LABPROT 14.4  INR 1.1    Urinalysis: No results for input(s): COLORURINE, LABSPEC, PHURINE, GLUCOSEU, HGBUR, BILIRUBINUR, KETONESUR, PROTEINUR, UROBILINOGEN, NITRITE, LEUKOCYTESUR in the last 72 hours.  Invalid input(s): APPERANCEUR    Imaging: MR BRAIN WO CONTRAST Result Date: 05/01/2024 CLINICAL DATA:  Initial evaluation for acute neuro deficit, stroke suspected. EXAM: MRI HEAD WITHOUT CONTRAST TECHNIQUE: Multiplanar, multiecho pulse sequences of the brain and surrounding structures were obtained without intravenous contrast. COMPARISON:  CT from earlier the same day. FINDINGS: Brain: Cerebral volume within normal limits. Patchy and confluent T2/FLAIR hyperintensity involving the periventricular and deep white matter both cerebral hemispheres as well as the pons, consistent with chronic small vessel ischemic disease, moderate in nature. Few scatter remote lacunar infarcts present about the right hemispheric cerebral white matter, right basal ganglia, thalami, and pons. Few tiny remote cerebellar infarcts noted. No abnormal foci of restricted diffusion to suggest acute or subacute ischemia. Gray-white matter differentiation maintained. No areas of chronic cortical infarction. No acute intracranial hemorrhage. Single punctate chronic microhemorrhage noted within the right thalamus. No mass lesion, midline shift or mass effect. No hydrocephalus or extra-axial fluid collection. Pituitary gland within normal limits. Vascular: Hypoplastic right vertebral artery not well seen. Major intracranial vascular flow voids are  otherwise maintained. Skull and upper cervical spine: Craniocervical junction within normal limits. Mildly decreased T1 signal intensity within the visualized bone marrow, nonspecific, but most commonly related to anemia, smoking or obesity. No scalp soft tissue abnormality. Sinuses/Orbits: Globes orbital soft tissues within normal limits. Paranasal  sinuses are largely clear. No significant mastoid effusion. Other: None. IMPRESSION: 1. No acute intracranial abnormality. 2. Moderate chronic microvascular ischemic disease with a few scattered remote lacunar infarcts involving the right hemispheric cerebral white matter, right basal ganglia, thalami, and pons. Electronically Signed   By: Morene Hoard M.D.   On: 05/01/2024 21:16   CT HEAD WO CONTRAST ( ) Result Date: 05/01/2024 EXAM: CT HEAD WITHOUT CONTRAST 05/01/2024 04:27:00 PM TECHNIQUE: CT of the head was performed without the administration of intravenous contrast. Automated exposure control, iterative reconstruction, and/or weight based adjustment of the mA/kV was utilized to reduce the radiation dose to as low as reasonably achievable. COMPARISON: None available. CLINICAL HISTORY: 68 year old male. Neuro deficit, acute, stroke suspected. FINDINGS: BRAIN AND VENTRICLES: Normal brain volume for age. No acute hemorrhage. No hydrocephalus. No extra-axial collection. No mass effect or midline shift. Asymmetric moderate patchy and scattered bilateral cerebral white matter hypodensity. Age indeterminate hypodensity in the medial right thalamus on series 2 image 14. Similar small but circumscribed hypodensity in the right caudate nucleus appears to be a chronic lacunar infarct (coronal image 29). Moderate patchy hypodensity in the left paracentral pons (series 2 image 9). No suspicious intracranial vascular hyperdensity. ORBITS: No acute abnormality. SINUSES: Paranasal sinuses, middle ears and mastoids are well aerated. SOFT TISSUES AND SKULL: Calcified  scalp vessel atherosclerosis. No gaze deviation. Calcified atherosclerosis at the skull base. No acute soft tissue abnormality. No skull fracture. IMPRESSION: 1. Advanced chronic small vessel disease, including age indeterminate involvement of the right thalamus. Electronically signed by: Helayne Hurst MD 05/01/2024 04:49 PM EST RP Workstation: [id number]   CT ABDOMEN PELVIS W CONTRAST Result Date: 05/01/2024 EXAM: CT ABDOMEN AND PELVIS WITH CONTRAST 05/01/2024 08:12:07 AM TECHNIQUE: CT of the abdomen and pelvis was performed with the administration of 100 mL of iohexol  (OMNIPAQUE ) 300 MG/ML solution. Multiplanar reformatted images are provided for review. Automated exposure control, iterative reconstruction, and/or weight-based adjustment of the mA/kV was utilized to reduce the radiation dose to as low as reasonably achievable. COMPARISON: CT of the abdomen and pelvis dated 02/15/2024. CLINICAL HISTORY: Abdominal pain, acute, nonlocalized; perc chole drain, RUQ pain, purulent drainage, eval for infection. FINDINGS: LOWER CHEST: There is mild atelectasis present dependently within the lower lobes bilaterally. The patient is again noted to be status post aortic valve repair. LIVER: The liver is unremarkable. GALLBLADDER AND BILE DUCTS: A cholecystostomy tube remains in place. No biliary ductal dilatation. SPLEEN: No acute abnormality. PANCREAS: No acute abnormality. ADRENAL GLANDS: No acute abnormality. KIDNEYS, URETERS AND BLADDER: There is moderate right hydroureteronephrosis. No stones in the kidneys or ureters. No perinephric or periureteral stranding. Urinary bladder is unremarkable. GI AND BOWEL: Stomach demonstrates no acute abnormality. There is no bowel obstruction. There are few scattered colonic diverticula. The patient is status post appendectomy. PERITONEUM AND RETROPERITONEUM: No ascites. No free air. VASCULATURE: The abdominal aorta is normal in caliber and demonstrates moderate calcific atheromatous  disease. LYMPH NODES: No lymphadenopathy. REPRODUCTIVE ORGANS: No acute abnormality. BONES AND SOFT TISSUES: There is bilateral spondylolysis and grade 1 anterolisthesis at L5-S1. No focal soft tissue abnormality. IMPRESSION: 1. Moderate right hydroureteronephrosis without stones or perinephric/periureteral stranding. Correlate clinically for obstructive etiology; consider urologic consultation if clinically indicated. 2. Cholecystostomy tube in place. No biliary ductal dilatation. Electronically signed by: Evalene Coho MD 05/01/2024 08:26 AM EST RP Workstation: [id number]   DG Chest Port 1 View Result Date: 05/01/2024 CLINICAL DATA:  Sepsis. EXAM: PORTABLE CHEST 1 VIEW COMPARISON:  11/11/2023 FINDINGS: The  heart size and mediastinal contours are within normal limits. Prior CABG and TAVR noted. Right jugular dual-lumen central venous dialysis catheter remains in appropriate position. Both lungs are clear. IMPRESSION: No active disease. Electronically Signed   By: Norleen DELENA Kil M.D.   On: 05/01/2024 07:52     Assessment & Plan: [name] is a 68 y.o.  male with past medical history of hyperlipidemia, neuropathy, recent acute cholecystitis with percutaneous tube insertion, PAD with PCI, and end-stage renal disease on hemodialysis, who was admitted to [facility] on 05/01/2024 for SIRS (systemic inflammatory response syndrome) (HCC) [R65.10] Right upper quadrant abdominal pain [R10.11] Hydronephrosis, unspecified hydronephrosis type [N13.30]  End stage renal disease on hemodialysis. Last treatment completed yesterday, terminated after 2.5hrs due to fever, chills and tachycardia. Will plan for next treatment on Tuesday.   2. Fever, unknown origin. Associated symptoms of tachycardia and chills. Septic workup in progress. Blood cultures pending. Permcath placed 2 weeks ago, per patient. Will wait 1-2 days to evaluate blood culture results to determine if permcath removal is needed.   3. Anemia of  chronic kidney disease Lab Results  Component Value Date   HGB 8.5 (L) 05/02/2024     Hgb reduced. Will order ESA with dialysis while inpatient.   4. Secondary Hyperparathyroidism: with outpatient labs:None available Lab Results  Component Value Date   PTH 332 (H) 08/13/2023   CALCIUM  8.3 (L) 05/02/2024   CAION 0.99 (L) 04/12/2024   PHOS 4.3 04/15/2024    Will continue to monitor bone minerals.   LOS: 1 Cirilo Canner 11/30/202511:20 AM

## 2024-05-03 ENCOUNTER — Ambulatory Visit

## 2024-05-03 ENCOUNTER — Telehealth: Payer: Self-pay

## 2024-05-03 ENCOUNTER — Inpatient Hospital Stay: Admitting: Radiology

## 2024-05-03 ENCOUNTER — Inpatient Hospital Stay

## 2024-05-03 DIAGNOSIS — R6510 Systemic inflammatory response syndrome (SIRS) of non-infectious origin without acute organ dysfunction: Secondary | ICD-10-CM | POA: Diagnosis not present

## 2024-05-03 HISTORY — PX: IR EXCHANGE BILIARY DRAIN: IMG6046

## 2024-05-03 MED ORDER — IOHEXOL 300 MG/ML  SOLN: 8.0000 mL | Freq: Once | INTRAMUSCULAR | Status: DC | PRN

## 2024-05-03 MED ORDER — LIDOCAINE HCL 1 % IJ SOLN
INTRAMUSCULAR | Status: AC
  Filled 2024-05-03: qty 20

## 2024-05-03 NOTE — Consult Note (Addendum)
 WOC Nurse Consult Note: Reason for Consult: Consult requested to reapply a Vac dressing to left stump.  Pt had home Vac machine applied to a dressing prior to admission and he states it was sent home and a moist gauze dressing has been applied previously.    Left stump with full thickness wound, 85% red, 15% yellow interspersed throughout, 2X6X.3cm, small amt yellow drainage.  Pt was medicated for pain prior to the procedure and tolerated without discomfort.  Applied one piece black foam to cont suctions.   WOC team will plan to change again on Thurs if the patient is still here at that time.   Orders provided for bedside nurses to perform as follows: WOC nurse will change Vac dressing to left stump Q Mon and Thurs. Pt states has a Vac machine at home which can be reconnected when he plans to discharge home.   Thank-you, Stephane Fought MSN, RN, CWOCN, CWCN-AP, CNS Contact Mon-Fri 0700-1500: [phone]

## 2024-05-03 NOTE — Progress Notes (Signed)
 Brief Interventional Radiology Note:  IR contacted to evaluate cholecystostomy drain at the request of team and family. Per team, patient noted to have increasing amount purulent drainage in his chole bag as well as worsening right sided abdominal pain.   Patient seen at the bedside. Family reports that drain has been working well, but that Friday night patient began to experience worsening right sided abdominal pain and experienced one episode of vomiting. He was brought to the ED for further evaluation. Imaging revealed an appropriately placed chole drain, but given patient's pain, lactic of 1.4, and elevated temperature patient was admitted for further evaluation. WBC has remained WNL until today where it increased to 14.0.   Discussed with family and team that patient has a history of purulent output so this is not new or concerning. However, patient's last drain exchange was on 10/3 and it has been recommended he continue with routine exchanges every 6 weeks (8 weeks max). Given his history of leaking secondary a stone filled gallbladder and his current symptoms, recommend that patient undergo exchange while he is inpatient. Patient and team are both agreeable to this plan.   Will plan for patient to undergo routine drain exchange today in IR.   Radiology number provided to patient and his wife should they need to reach out to IR in the future to ensure they are scheduled for an exchange every 6-8 weeks. Wife verbalized understanding.   Wife also reports they are scheduled for follow up with General Surgery on 12/3 to discuss possible cholecystectomy if they are able to make this appointment. Recommended close follow up with surgery as this will be the most definitive option for the patient.   Order for the next 6 week exchange already placed.  Please reach out to IR with any additional questions or concerns.  Electronically Signed: Dimitria Ketchum M Shanyia Stines, PA-C 05/03/2024, 3:55 PM

## 2024-05-03 NOTE — Evaluation (Signed)
 Occupational Therapy Evaluation Patient Details Name: Phillip [name] MRN: [id number] DOB: 1955-11-25 Today's Date: 05/03/2024   History of Present Illness   Pt is a 68 y.o. 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 MD assessment includes: SIRS, R sided weakness.     Clinical Impressions Patient presenting with decreased Ind in self care,balance, functional mobility,transfers, endurance, and safety awareness. Patient reports living at home with wife and needing assistance with self care and functional transfers. Pt endorses wife assists him with transfers and PCA assists with ADLs 2x/wk. He is wheelchair level and wife assists him into car to go to HD 3x/wk. Pt needing Max A of 2 for lateral scoot transfer to drop arm recliner chair. Pt is far from baseline.  Patient will benefit from acute OT to increase overall independence in the areas of ADLs, functional mobility, and safety awareness in order to safely discharge.     If plan is discharge home, recommend the following:   Assistance with cooking/housework;Assist for transportation;Help with stairs or ramp for entrance;A lot of help with walking and/or transfers;A lot of help with bathing/dressing/bathroom     Functional Status Assessment   Patient has had a recent decline in their functional status and demonstrates the ability to make significant improvements in function in a reasonable and predictable amount of time.     Equipment Recommendations   Other (comment) (defer to next venue of care)     Recommendations for Other Services         Precautions/Restrictions   Precautions Precautions: Fall Recall of  Precautions/Restrictions: Intact     Mobility Bed Mobility Overal bed mobility: Needs Assistance Bed Mobility: Supine to Sit     Supine to sit: Contact guard          Transfers Overall transfer level: Needs assistance Equipment used: 2 person hand held assist              Lateral/Scoot Transfers: Max assist, +2 physical assistance        Balance Overall balance assessment: Needs assistance Sitting-balance support: No upper extremity supported, Feet supported Sitting balance-Leahy Scale: Good   Postural control: Posterior lean                                 ADL either performed or assessed with clinical judgement   ADL Overall ADL's : Needs assistance/impaired                         Toilet Transfer: +2 for safety/equipment;+2 for physical assistance;Maximal assistance Toilet Transfer Details (indicate cue type and reason): lateral scoot simulated transfer                 Vision Patient Visual Report: No change from baseline       Perception         Praxis         Pertinent Vitals/Pain Pain Assessment Pain Assessment: Faces Faces Pain Scale: Hurts a little bit Pain Location: L residual limb Pain Descriptors / Indicators: Discomfort Pain Intervention(s): Monitored during session, Limited activity within patient's tolerance, Repositioned     Extremity/Trunk Assessment Upper Extremity Assessment  Upper Extremity Assessment: Generalized weakness   Lower Extremity Assessment Lower Extremity Assessment: Generalized weakness LLE Deficits / Details: L BKA   Cervical / Trunk Assessment Cervical / Trunk Assessment: Normal   Communication Communication Communication: Impaired Factors Affecting Communication: Reduced clarity of speech   Cognition Arousal: Alert Behavior During Therapy: Flat affect, Impulsive Cognition: No apparent impairments                               Following commands:  Impaired Following commands impaired: Follows one step commands with increased time     Cueing  General Comments   Cueing Techniques: Verbal cues;Visual cues;Tactile cues;Gestural cues  pt tolerated sitting and transfers, but needed max assist +2 and max cueing for sequencing, needs additional time for following commands   Exercises     Shoulder Instructions      Home Living Family/patient expects to be discharged to:: Private residence Living Arrangements: Spouse/significant other Available Help at Discharge: Family;Available 24 hours/day;Personal care attendant Type of Home: Mobile home Home Access: Ramped entrance     Home Layout: One level     Bathroom Shower/Tub: Walk-in shower;Sponge bathes at baseline   Bathroom Toilet: Handicapped height Bathroom Accessibility: Yes How Accessible: Accessible via walker Home Equipment: BSC/3in1;Wheelchair - manual;Shower seat   Additional Comments: Pt reports he is actively recieving HH PT -- unable to give specifics on how often they visit      Prior Functioning/Environment Prior Level of Function : Needs assist         Mobility (physical): Transfers   Mobility Comments: Spouse helps with transfers to wc. Pt is able to laterally scoot in order to transfer. No falls. uses wc for mobilization ADLs Comments: Needs assist with ADLs and IADLs. He states PCA comes 2x/wk for a few hours. Wife takes him to HD.    OT Problem List: Decreased activity tolerance;Impaired balance (sitting and/or standing);Decreased knowledge of use of DME or AE;Decreased knowledge of precautions;Pain;Decreased coordination   OT Treatment/Interventions: Self-care/ADL training;Therapeutic exercise;Energy conservation;DME and/or AE instruction;Therapeutic activities;Patient/family education;Balance training      OT Goals(Current goals can be found in the care plan section)   Acute Rehab OT Goals Patient Stated Goal: to get better OT Goal  Formulation: With patient Time For Goal Achievement: 05/17/24 Potential to Achieve Goals: Good ADL Goals Pt Will Perform Grooming: (P) with modified independence;sitting Pt Will Perform Lower Body Dressing: (P) with supervision;with adaptive equipment;sitting/lateral leans Pt Will Transfer to Toilet: (P) with supervision;squat pivot transfer Pt Will Perform Toileting - Clothing Manipulation and hygiene: (P) with supervision;with adaptive equipment;sitting/lateral leans   OT Frequency:  Min 2X/week    Co-evaluation PT/OT/SLP Co-Evaluation/Treatment: Yes Reason for Co-Treatment: Complexity of the patient's impairments (multi-system involvement);For patient/therapist safety;To address functional/ADL transfers PT goals addressed during session: Mobility/safety with mobility OT goals addressed during session: ADL's and self-care      AM-PAC OT 6 Clicks Daily Activity     Outcome Measure Help from another person eating meals?: None Help from another person taking care of personal grooming?: A Little Help from another person toileting, which includes using toliet, bedpan, or urinal?: A Little Help from another person bathing (including washing, rinsing, drying)?: A Lot Help from another person to put on and taking off regular upper body clothing?: A Little Help from another person to put on and taking off regular lower body clothing?: A Lot 6 Click Score: 17   End of Session Equipment Utilized  During Treatment: Gait belt;Rolling walker (2 wheels) Nurse Communication: Mobility status  Activity Tolerance: Patient tolerated treatment well Patient left: in chair;with call bell/phone within reach  OT Visit Diagnosis: Other abnormalities of gait and mobility (R26.89);Muscle weakness (generalized) (M62.81);Pain Pain - Right/Left: Left Pain - part of body: Leg                Time: 9066-9045 OT Time Calculation (min): 21 min Charges:  OT General Charges $OT Visit: 1 Visit OT  Evaluation $OT Eval Moderate Complexity: 1 [address] [place], OTR/L , CBIS ascom [phone]  05/03/24, 12:07 PM

## 2024-05-03 NOTE — Evaluation (Signed)
 Physical Therapy Evaluation Patient Details Name: Phillip [name] MRN: [id number] DOB: [birth year] Today's Date: 05/03/2024  History of Present Illness  Pt is a 68 y.o. 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 MD assessment includes: SIRS, R sided weakness.    Clinical Impression   Pt was lying bed on arrival, was pleasant and motivated to participate during the session and put forth good effort throughout. Pt was able to mobilize with supervision to EOB and performed lateral scooting to adjust positioning. Pt did not utilizing bed rails during bed mobility. Pt required education hand placement, lateral scooting and sequencing for proper transfer to chair. Pt required max A +2 and max cueing throughout transfer. Pt will benefit from continued PT services upon discharge to safely address deficits, especially transfers, listed in patient problem list for decreased caregiver assistance and eventual return to PLOF.            If plan is discharge home, recommend the following: A little help with walking and/or transfers;A little help with bathing/dressing/bathroom;Assistance with cooking/housework;Assist for transportation;Help with stairs or ramp for entrance   Can travel by private vehicle   No    Equipment Recommendations None recommended by PT  Recommendations for Other Services       Functional Status Assessment Patient has had a recent decline in their functional status and demonstrates the ability to make significant improvements in function in a reasonable and predictable amount of time.     Precautions / Restrictions Precautions Precautions: Fall Recall of  Precautions/Restrictions: Intact Restrictions Weight Bearing Restrictions Per Provider Order: No      Mobility  Bed Mobility Overal bed mobility: Needs Assistance Bed Mobility: Supine to Sit     Supine to sit: Supervision     General bed mobility comments: stand by assist for safety. Pt was able sit upright on EOB, without cueing, and laterally scoot to adjust positioning    Transfers Overall transfer level: Needs assistance Equipment used: None   Sit to Stand: Mod assist, Max assist, +2 physical assistance          Lateral/Scoot Transfers: Max assist, +2 physical assistance General transfer comment: Pt performed lateral scooting on the bed prior to transfer. However, pt needed sig cueing for hand placement and sequencing during transfer to chair. Max A +2 for safety    Ambulation/Gait               General Gait Details: pt uses manual wc since amputation, no ambualation  Stairs            Wheelchair Mobility     Tilt Bed    Modified Rankin (Stroke Patients Only)       Balance Overall balance assessment: Needs assistance (needs cueing for hand placement) Sitting-balance support: No upper extremity supported, Feet supported Sitting balance-Leahy Scale: Good Sitting balance - Comments: pt was able to sit EOB with good UE support -- needed cueing for hand placement at times Postural control: Posterior lean Standing balance support:  (did not atttempt)                                 Pertinent Vitals/Pain Pain Assessment  Pain Assessment: Faces Faces Pain Scale: Hurts a little bit Breathing: normal Pain Location: L residual limb Pain Descriptors / Indicators: Discomfort    Home Living Family/patient expects to be discharged to:: Private residence Living Arrangements: Spouse/significant other Available Help at Discharge: Family;Available 24 hours/day Type of Home: Mobile home Home Access: Ramped entrance       Home Layout:  One level Home Equipment: BSC/3in1;Wheelchair - manual;Shower seat Additional Comments: Pt reports he is actively recieving HH PT -- unable to give specifics on how often they visit    Prior Function Prior Level of Function : Needs assist         Mobility (physical): Transfers   Mobility Comments: Spouse helps with transfers to wc. Pt is able to laterally scoot in order to transfer. No falls. uses wc for mobilization ADLs Comments: Needs assist with ADLs     Extremity/Trunk Assessment   Upper Extremity Assessment Upper Extremity Assessment: Defer to OT evaluation    Lower Extremity Assessment Lower Extremity Assessment: Generalized weakness    Cervical / Trunk Assessment Cervical / Trunk Assessment: Normal  Communication   Communication Communication: Impaired Factors Affecting Communication: Reduced clarity of speech    Cognition Arousal: Alert Behavior During Therapy: Flat affect, Impulsive   PT - Cognitive impairments: Sequencing, Problem solving, Initiation, Attention                         Following commands: Impaired Following commands impaired: Follows one step commands with increased time     Cueing Cueing Techniques: Verbal cues, Visual cues, Tactile cues, Gestural cues     General Comments General comments (skin integrity, edema, etc.): pt tolerated sitting and transfers, but needed max assist +2 and max cueing for sequencing, needs additional time for following commands    Exercises  Pt edu on transfers, lateral scooting and proper sequencing    Assessment/Plan    PT Assessment Patient needs continued PT services  PT Problem List Decreased strength;Decreased range of motion;Decreased activity tolerance;Decreased balance;Decreased mobility;Decreased knowledge of use of DME;Decreased safety awareness;Pain       PT Treatment Interventions DME instruction;Functional mobility training;Therapeutic activities;Therapeutic exercise;Balance  training;Patient/family education;Wheelchair mobility training;Gait training    PT Goals (Current goals can be found in the Care Plan section)  Acute Rehab PT Goals Patient Stated Goal: return home PT Goal Formulation: With patient Time For Goal Achievement: 05/16/24 Potential to Achieve Goals: Good    Frequency Min 2X/week     Co-evaluation PT/OT/SLP Co-Evaluation/Treatment: Yes Reason for Co-Treatment: Complexity of the patient's impairments (multi-system involvement);For patient/therapist safety;To address functional/ADL transfers PT goals addressed during session: Mobility/safety with mobility OT goals addressed during session: ADL's and self-care       AM-PAC PT 6 Clicks Mobility  Outcome Measure Help needed turning from your back to your side while in a flat bed without using bedrails?: A Little Help needed moving from lying on your back to sitting on the side of a flat bed without using bedrails?: A Little Help needed moving to and from a bed to a chair (including a wheelchair)?: A Little Help needed standing up from a chair using your arms (e.g., wheelchair or bedside chair)?: Total Help needed to walk in hospital room?: Total Help needed climbing 3-5 steps with a railing? : Total 6 Click Score: 12    End of Session Equipment Utilized During Treatment: Gait belt Activity Tolerance: Patient tolerated treatment well Patient left: in chair;with call bell/phone within reach;with chair  alarm set Nurse Communication: Mobility status PT Visit Diagnosis: Muscle weakness (generalized) (M62.81);Other abnormalities of gait and mobility (R26.89)    Time: 9064-9046 PT Time Calculation (min) (ACUTE ONLY): 18 min   Charges:                 Corean Newport, SPT 05/03/24, 12:28 PM

## 2024-05-03 NOTE — TOC CM/SW Note (Signed)
 CSW went by room to discuss SNF but he had just returned from procedure and was still asleep. Will try again later.  Lauraine Carpen, CSW [phone]

## 2024-05-03 NOTE — Progress Notes (Signed)
 PROGRESS NOTE    DEERIC CRUISE  FMW:[id number] DOB: 12-21-1955 DOA: 05/01/2024 PCP: [name] LABOR., MD    Brief Narrative:   68 year old male with history of end-stage renal disease on hemodialysis, history of severe aortic stenosis status post TAVR, hyperlipidemia, neuropathy, hypothyroid, recent acute cholecystitis status post percutaneous cholecystostomy tube placement, PAD status post PCI and ambulation with BKA of the left leg, who presents to the ED for chief concerns of right sided abdominal pain.    Assessment & Plan:   Principal Problem:   SIRS (systemic inflammatory response syndrome) (HCC) Active Problems:   Right sided weakness   CAD (coronary artery disease)   Hypothyroidism (acquired)   Essential hypertension   ESRD on dialysis [facility])   Atherosclerosis of native arteries of the extremities with ulceration (HCC)   Diabetes mellitus without complication (HCC)   Hyperlipidemia associated with type 2 diabetes mellitus (HCC)   Pulmonary HTN (HCC)   S/P TAVR (transcatheter aortic valve replacement)   Protein-calorie malnutrition, severe   S/P BKA (below knee amputation) unilateral, left (HCC)   Elevated troponin  SIRS (systemic inflammatory response syndrome) (HCC) Patient met SIRS criteria with fever, Tmax of 102.7, increased respiration rate, increased heart rate Organ involvement likely neurological/altered mentation with possible source of intra-abdominal in setting of cholecystostomy tube versus left BKA Interventional radiology has been consulted by EDP, Dr. Jenna is aware Per EDP, IR, Dr. Jenna states that IR will see the patient Plan: Continue with broad-spectrum IV antibiotics for intra-abdominal coverage.  Ceftriaxone  and metronidazole  for now.  Follow blood cultures, no growth to date.  Maps are maintaining.  No signs of shock physiology.   Right sided weakness Of the right upper and right lower extremity weakness With change in mental  status Unclear how long this has been going on Patient spouse reports that patient is right-handed.  He just had physical therapy so sometimes after physical therapy he does have right-sided weakness.  However normally on the right leg, he is able to move without difficulty CT head wo contrast: Advanced chronic small vessel disease, including age-indeterminate involvement of the right thalamus MRI brain negative Plan: Consult therapy services   CAD (coronary artery disease) Aspirin  Plavix  resumed   ESRD on dialysis [facility]) Had a short run of hemodialysis on 11/29.  Rapid response called for febrile episode.  Next dialysis scheduled for Tuesday.  Will need to ensure no line sepsis before then.  Follow-up blood cultures   Essential hypertension Home regimen   Hypothyroidism (acquired) Home levothyroxine  50 mcg daily resume   Elevated troponin Suspect demand ischemia in setting of elevated heart rate   S/P BKA (below knee amputation) unilateral, left (HCC) Wound VAC in place   Hyperlipidemia associated with type 2 diabetes mellitus (HCC) Home atorvastatin  20 mg daily resumed   Diabetes mellitus without complication (HCC) Labs A1c was 4.4.  Insulin  SSI with at bedtime coverage is not indicated at this time     DVT prophylaxis: SQH Code Status: Full Family Communication: Spouse at bedside 11/30, via phone 12/1 Disposition Plan: Status is: Inpatient Remains inpatient appropriate because: Sepsis   Level of care: Progressive  Consultants:  Nephrology  Procedures:  None  Antimicrobials: Rocephin  Metronidazole    Subjective: Seen and examined.  Resting comfortably in bed.  No distress.  No complaints  Objective: Vitals:   05/02/24 2011 05/03/24 0000 05/03/24 0515 05/03/24 0809  BP: 97/60 99/62 99/64  115/77  Pulse: 93 90 90 93  Resp: 17 17 17  Temp: 98.6 F (37 C) 98.8 F (37.1 C) 99.9 F (37.7 C) 99.7 F (37.6 C)  TempSrc: Oral Oral Oral Oral  SpO2: 99% 99%  98% 99%  Weight:      Height:        Intake/Output Summary (Last 24 hours) at 05/03/2024 1127 Last data filed at 05/03/2024 0900 Gross per 24 hour  Intake 1493.86 ml  Output --  Net 1493.86 ml   Filed Weights   05/01/24 0729 05/01/24 1149  Weight: 80.7 kg 83.3 kg    Examination:  General exam: NAD Respiratory system: Clear to auscultation. Respiratory effort normal. Cardiovascular system: S1-S2, RRR, no murmurs, no pedal edema Gastrointestinal system:, NT/ND, normal bowel sounds, no cholecystostomy Central nervous system: Alert and oriented. No focal neurological deficits. Extremities: Left BKA with wound VAC Skin: No rashes, lesions or ulcers Psychiatry: Judgement and insight appear normal. Mood & affect appropriate.     Data Reviewed: I have personally reviewed following labs and imaging studies  CBC: Recent Labs  Lab 05/01/24 0731 05/02/24 0550  WBC 8.8 14.0*  NEUTROABS 7.3  --   HGB 9.0* 8.5*  HCT 29.1* 27.7*  MCV 99.3 101.1*  PLT 126* 95*   Basic Metabolic Panel: Recent Labs  Lab 05/01/24 0731 05/02/24 0550  NA 142 138  K 3.8 3.9  CL 100 100  CO2 30 25  GLUCOSE 145* 125*  BUN 32* 33*  CREATININE 4.87* 5.31*  CALCIUM  8.8* 8.3*   GFR: Estimated Creatinine Clearance: 14.6 mL/min (A) (by C-G formula based on SCr of 5.31 mg/dL (H)). Liver Function Tests: Recent Labs  Lab 05/01/24 0731  AST 39  ALT 19  ALKPHOS 238*  BILITOT 0.7  PROT 7.6  ALBUMIN  3.4*   Recent Labs  Lab 05/01/24 0731  LIPASE 41   No results for input(s): AMMONIA in the last 168 hours. Coagulation Profile: Recent Labs  Lab 05/01/24 0731  INR 1.1   Cardiac Enzymes: No results for input(s): CKTOTAL, CKMB, CKMBINDEX, TROPONINI in the last 168 hours. BNP (last 3 results) Recent Labs    07/14/23 1412  PROBNP 11,269*   HbA1C: No results for input(s): HGBA1C in the last 72 hours. CBG: No results for input(s): GLUCAP in the last 168 hours. Lipid  Profile: No results for input(s): CHOL, HDL, LDLCALC, TRIG, CHOLHDL, LDLDIRECT in the last 72 hours. Thyroid  Function Tests: No results for input(s): TSH, T4TOTAL, FREET4, T3FREE, THYROIDAB in the last 72 hours. Anemia Panel: No results for input(s): VITAMINB12, FOLATE, FERRITIN, TIBC, IRON , RETICCTPCT in the last 72 hours. Sepsis Labs: Recent Labs  Lab 05/01/24 0731 05/01/24 1510 05/01/24 1738 05/02/24 0905  PROCALCITON  --   --   --  100.00  LATICACIDVEN 1.4 3.1* 2.3*  --     Recent Results (from the past 240 hours)  Blood Culture (routine x 2)     Status: None (Preliminary result)   Collection Time: 05/01/24  7:44 AM   Specimen: BLOOD RIGHT FOREARM  Result Value Ref Range Status   Specimen Description BLOOD RIGHT FOREARM  Final   Special Requests   Final    BOTTLES DRAWN AEROBIC AND ANAEROBIC Blood Culture adequate volume   Culture   Final    NO GROWTH 2 DAYS Performed at [facility], [address], [place], [place] [address]    Report Status PENDING  Incomplete  Blood Culture (routine x 2)     Status: None (Preliminary result)   Collection Time: 05/01/24  7:44 AM  Specimen: Right Antecubital; Blood  Result Value Ref Range Status   Specimen Description RIGHT ANTECUBITAL  Final   Special Requests   Final    BOTTLES DRAWN AEROBIC AND ANAEROBIC Blood Culture adequate volume   Culture   Final    NO GROWTH 2 DAYS Performed at [facility], [address], [place], [place] [address]    Report Status PENDING  Incomplete         Radiology Studies: MR BRAIN WO CONTRAST Result Date: 05/01/2024 CLINICAL DATA:  Initial evaluation for acute neuro deficit, stroke suspected. EXAM: MRI HEAD WITHOUT CONTRAST TECHNIQUE: Multiplanar, multiecho pulse sequences of the brain and surrounding structures were obtained without intravenous contrast. COMPARISON:  CT from earlier the same day. FINDINGS: Brain: Cerebral volume within  normal limits. Patchy and confluent T2/FLAIR hyperintensity involving the periventricular and deep white matter both cerebral hemispheres as well as the pons, consistent with chronic small vessel ischemic disease, moderate in nature. Few scatter remote lacunar infarcts present about the right hemispheric cerebral white matter, right basal ganglia, thalami, and pons. Few tiny remote cerebellar infarcts noted. No abnormal foci of restricted diffusion to suggest acute or subacute ischemia. Gray-white matter differentiation maintained. No areas of chronic cortical infarction. No acute intracranial hemorrhage. Single punctate chronic microhemorrhage noted within the right thalamus. No mass lesion, midline shift or mass effect. No hydrocephalus or extra-axial fluid collection. Pituitary gland within normal limits. Vascular: Hypoplastic right vertebral artery not well seen. Major intracranial vascular flow voids are otherwise maintained. Skull and upper cervical spine: Craniocervical junction within normal limits. Mildly decreased T1 signal intensity within the visualized bone marrow, nonspecific, but most commonly related to anemia, smoking or obesity. No scalp soft tissue abnormality. Sinuses/Orbits: Globes orbital soft tissues within normal limits. Paranasal sinuses are largely clear. No significant mastoid effusion. Other: None. IMPRESSION: 1. No acute intracranial abnormality. 2. Moderate chronic microvascular ischemic disease with a few scattered remote lacunar infarcts involving the right hemispheric cerebral white matter, right basal ganglia, thalami, and pons. Electronically Signed   By: Morene Hoard M.D.   On: 05/01/2024 21:16   CT HEAD WO CONTRAST ( ) Result Date: 05/01/2024 EXAM: CT HEAD WITHOUT CONTRAST 05/01/2024 04:27:00 PM TECHNIQUE: CT of the head was performed without the administration of intravenous contrast. Automated exposure control, iterative reconstruction, and/or weight based  adjustment of the mA/kV was utilized to reduce the radiation dose to as low as reasonably achievable. COMPARISON: None available. CLINICAL HISTORY: 68 year old male. Neuro deficit, acute, stroke suspected. FINDINGS: BRAIN AND VENTRICLES: Normal brain volume for age. No acute hemorrhage. No hydrocephalus. No extra-axial collection. No mass effect or midline shift. Asymmetric moderate patchy and scattered bilateral cerebral white matter hypodensity. Age indeterminate hypodensity in the medial right thalamus on series 2 image 14. Similar small but circumscribed hypodensity in the right caudate nucleus appears to be a chronic lacunar infarct (coronal image 29). Moderate patchy hypodensity in the left paracentral pons (series 2 image 9). No suspicious intracranial vascular hyperdensity. ORBITS: No acute abnormality. SINUSES: Paranasal sinuses, middle ears and mastoids are well aerated. SOFT TISSUES AND SKULL: Calcified scalp vessel atherosclerosis. No gaze deviation. Calcified atherosclerosis at the skull base. No acute soft tissue abnormality. No skull fracture. IMPRESSION: 1. Advanced chronic small vessel disease, including age indeterminate involvement of the right thalamus. Electronically signed by: Helayne Hurst MD 05/01/2024 04:49 PM EST RP Workstation: [id number]        Scheduled Meds:  aspirin  EC  81 mg Oral Daily  atorvastatin   20 mg Oral Daily   B-complex with vitamin C  1 tablet Oral Daily   Chlorhexidine  Gluconate Cloth  6 each Topical Q0600   clopidogrel   75 mg Oral Daily   gabapentin   600 mg Oral BID   heparin   5,000 Units Subcutaneous Q8H   isosorbide  mononitrate  120 mg Oral Daily   levothyroxine   50 mcg Oral Q0600   sevelamer  carbonate  800 mg Oral TID WC   tamsulosin   0.4 mg Oral Daily   Continuous Infusions:  cefTRIAXone  (ROCEPHIN )  IV 2 g (05/02/24 1801)   metronidazole  500 mg (05/03/24 0338)     LOS: 2 days      Calvin KATHEE Robson, MD [facility]   If 7PM-7AM,  please contact night-coverage  05/03/2024, 11:27 AM

## 2024-05-03 NOTE — Progress Notes (Signed)
 Pt noted to have mild jerking movements when using his hands for drinking from a cup. No other neurologic deficits. Dr. Jhonny noted and CT ordered.

## 2024-05-03 NOTE — Progress Notes (Signed)
 [facility]  ROUNDING NOTE   Subjective:   Patient sitting at side of bed Alert No family present Room air No lower extremity edema  Afebrile overnight  Objective:  Vital signs in last 24 hours:  Temp:  [98.6 F (37 C)-99.9 F (37.7 C)] 99.7 F (37.6 C) (12/01 0809) Pulse Rate:  [86-93] 93 (12/01 0809) Resp:  [15-17] 17 (12/01 0515) BP: (97-115)/(60-77) 115/77 (12/01 0809) SpO2:  [98 %-100 %] 99 % (12/01 0809)  Weight change:  Filed Weights   05/01/24 0729 05/01/24 1149  Weight: 80.7 kg 83.3 kg    Intake/Output: I/O last 3 completed shifts: In: 2033.9 [P.O.:1320; IV Piggyback:713.9] Out: 10 [Drains:10]   Intake/Output this shift:  Total I/O In: 120 [P.O.:120] Out: -   Physical Exam: General: NAD  Head: Normocephalic, atraumatic. Moist oral mucosal membranes  Eyes: Anicteric  Lungs:  Clear to auscultation, normal effort  Heart: Regular rate and rhythm  Abdomen:  Soft, nontender  Extremities:  No peripheral edema. Lt BKA  Neurologic: Awake, alert, conversant  Skin: Warm,dry, no rash. NPWV-Left lateral BKA  Access: Rt internal jugular permcath    Basic Metabolic Panel: Recent Labs  Lab 05/01/24 0731 05/02/24 0550  NA 142 138  K 3.8 3.9  CL 100 100  CO2 30 25  GLUCOSE 145* 125*  BUN 32* 33*  CREATININE 4.87* 5.31*  CALCIUM  8.8* 8.3*    Liver Function Tests: Recent Labs  Lab 05/01/24 0731  AST 39  ALT 19  ALKPHOS 238*  BILITOT 0.7  PROT 7.6  ALBUMIN  3.4*   Recent Labs  Lab 05/01/24 0731  LIPASE 41   No results for input(s): AMMONIA in the last 168 hours.  CBC: Recent Labs  Lab 05/01/24 0731 05/02/24 0550  WBC 8.8 14.0*  NEUTROABS 7.3  --   HGB 9.0* 8.5*  HCT 29.1* 27.7*  MCV 99.3 101.1*  PLT 126* 95*    Cardiac Enzymes: No results for input(s): CKTOTAL, CKMB, CKMBINDEX, TROPONINI in the last 168 hours.  BNP: Invalid input(s): POCBNP  CBG: No results for input(s): GLUCAP in the last 168  hours.  Microbiology: Results for orders placed or performed during the hospital encounter of 05/01/24  Blood Culture (routine x 2)     Status: None (Preliminary result)   Collection Time: 05/01/24  7:44 AM   Specimen: BLOOD RIGHT FOREARM  Result Value Ref Range Status   Specimen Description BLOOD RIGHT FOREARM  Final   Special Requests   Final    BOTTLES DRAWN AEROBIC AND ANAEROBIC Blood Culture adequate volume   Culture   Final    NO GROWTH 2 DAYS Performed at [facility], [address] [place], [place] [address]    Report Status PENDING  Incomplete  Blood Culture (routine x 2)     Status: None (Preliminary result)   Collection Time: 05/01/24  7:44 AM   Specimen: Right Antecubital; Blood  Result Value Ref Range Status   Specimen Description RIGHT ANTECUBITAL  Final   Special Requests   Final    BOTTLES DRAWN AEROBIC AND ANAEROBIC Blood Culture adequate volume   Culture   Final    NO GROWTH 2 DAYS Performed at [facility], [address], [place], [place] [address]    Report Status PENDING  Incomplete    Coagulation Studies: Recent Labs    05/01/24 0731  LABPROT 14.4  INR 1.1    Urinalysis: No results for input(s): COLORURINE, LABSPEC, PHURINE, GLUCOSEU, HGBUR, BILIRUBINUR, KETONESUR,  PROTEINUR, UROBILINOGEN, NITRITE, LEUKOCYTESUR in the last 72 hours.  Invalid input(s): APPERANCEUR    Imaging: MR BRAIN WO CONTRAST Result Date: 05/01/2024 CLINICAL DATA:  Initial evaluation for acute neuro deficit, stroke suspected. EXAM: MRI HEAD WITHOUT CONTRAST TECHNIQUE: Multiplanar, multiecho pulse sequences of the brain and surrounding structures were obtained without intravenous contrast. COMPARISON:  CT from earlier the same day. FINDINGS: Brain: Cerebral volume within normal limits. Patchy and confluent T2/FLAIR hyperintensity involving the periventricular and deep white matter both cerebral hemispheres as well as the pons,  consistent with chronic small vessel ischemic disease, moderate in nature. Few scatter remote lacunar infarcts present about the right hemispheric cerebral white matter, right basal ganglia, thalami, and pons. Few tiny remote cerebellar infarcts noted. No abnormal foci of restricted diffusion to suggest acute or subacute ischemia. Gray-white matter differentiation maintained. No areas of chronic cortical infarction. No acute intracranial hemorrhage. Single punctate chronic microhemorrhage noted within the right thalamus. No mass lesion, midline shift or mass effect. No hydrocephalus or extra-axial fluid collection. Pituitary gland within normal limits. Vascular: Hypoplastic right vertebral artery not well seen. Major intracranial vascular flow voids are otherwise maintained. Skull and upper cervical spine: Craniocervical junction within normal limits. Mildly decreased T1 signal intensity within the visualized bone marrow, nonspecific, but most commonly related to anemia, smoking or obesity. No scalp soft tissue abnormality. Sinuses/Orbits: Globes orbital soft tissues within normal limits. Paranasal sinuses are largely clear. No significant mastoid effusion. Other: None. IMPRESSION: 1. No acute intracranial abnormality. 2. Moderate chronic microvascular ischemic disease with a few scattered remote lacunar infarcts involving the right hemispheric cerebral white matter, right basal ganglia, thalami, and pons. Electronically Signed   By: Morene Hoard M.D.   On: 05/01/2024 21:16   CT HEAD WO CONTRAST ( ) Result Date: 05/01/2024 EXAM: CT HEAD WITHOUT CONTRAST 05/01/2024 04:27:00 PM TECHNIQUE: CT of the head was performed without the administration of intravenous contrast. Automated exposure control, iterative reconstruction, and/or weight based adjustment of the mA/kV was utilized to reduce the radiation dose to as low as reasonably achievable. COMPARISON: None available. CLINICAL HISTORY: 68 year old male.  Neuro deficit, acute, stroke suspected. FINDINGS: BRAIN AND VENTRICLES: Normal brain volume for age. No acute hemorrhage. No hydrocephalus. No extra-axial collection. No mass effect or midline shift. Asymmetric moderate patchy and scattered bilateral cerebral white matter hypodensity. Age indeterminate hypodensity in the medial right thalamus on series 2 image 14. Similar small but circumscribed hypodensity in the right caudate nucleus appears to be a chronic lacunar infarct (coronal image 29). Moderate patchy hypodensity in the left paracentral pons (series 2 image 9). No suspicious intracranial vascular hyperdensity. ORBITS: No acute abnormality. SINUSES: Paranasal sinuses, middle ears and mastoids are well aerated. SOFT TISSUES AND SKULL: Calcified scalp vessel atherosclerosis. No gaze deviation. Calcified atherosclerosis at the skull base. No acute soft tissue abnormality. No skull fracture. IMPRESSION: 1. Advanced chronic small vessel disease, including age indeterminate involvement of the right thalamus. Electronically signed by: Helayne Hurst MD 05/01/2024 04:49 PM EST RP Workstation: [id number]     Medications:    cefTRIAXone  (ROCEPHIN )  IV 2 g (05/02/24 1801)   metronidazole  500 mg (05/03/24 0338)    aspirin  EC  81 mg Oral Daily   atorvastatin   20 mg Oral Daily   B-complex with vitamin C  1 tablet Oral Daily   Chlorhexidine  Gluconate Cloth  6 each Topical Q0600   clopidogrel   75 mg Oral Daily   gabapentin   600 mg Oral BID   heparin   5,000 Units Subcutaneous Q8H   isosorbide  mononitrate  120 mg Oral Daily   levothyroxine   50 mcg Oral Q0600   sevelamer  carbonate  800 mg Oral TID WC   tamsulosin   0.4 mg Oral Daily   acetaminophen  **OR** acetaminophen , hydrALAZINE , ondansetron  **OR** ondansetron  (ZOFRAN ) IV, mouth rinse, oxyCODONE , senna-docusate  Assessment/ Plan:  Mr. KACY CONELY is a 68 y.o.  male with past medical history of hyperlipidemia, neuropathy, recent acute cholecystitis  with percutaneous tube insertion, PAD with PCI, and end-stage renal disease on hemodialysis, who was admitted to [facility] on 05/01/2024 for SIRS (systemic inflammatory response syndrome) (HCC) [R65.10] Right upper quadrant abdominal pain [R10.11] Hydronephrosis, unspecified hydronephrosis type [N13.30]   End stage renal disease on hemodialysis.SABRA Next treatment on Tuesday.    Fever, unknown origin. Associated symptoms of tachycardia and chills. Septic workup in progress. Blood cultures remain negative. Permcath placed 2 weeks ago, per patient. Unlikely permcath source.    3. Anemia of chronic kidney disease Lab Results  Component Value Date   HGB 8.5 (L) 05/02/2024    Hgb not within optimal range. Will order Retacrit  with dialysis tomorrow.   4. Secondary Hyperparathyroidism: with outpatient labs: none available Lab Results  Component Value Date   PTH 332 (H) 08/13/2023   CALCIUM  8.3 (L) 05/02/2024   CAION 0.99 (L) 04/12/2024   PHOS 4.3 04/15/2024    Calcium  at desired range.    LOS: 2 Phillip [name] 12/1/202512:06 PM

## 2024-05-03 NOTE — Progress Notes (Signed)
 Pt receives outpt HD at [facility] on TTS at 10:40am. Navigator following to assist with any HD needs.  Suzen Satchel Dialysis Navigator [phone]

## 2024-05-03 DEATH — deceased

## 2024-05-04 DIAGNOSIS — R6510 Systemic inflammatory response syndrome (SIRS) of non-infectious origin without acute organ dysfunction: Secondary | ICD-10-CM | POA: Diagnosis not present

## 2024-05-04 LAB — BASIC METABOLIC PANEL WITH GFR
Anion gap: 18 — ABNORMAL HIGH (ref 5–15)
BUN: 57 mg/dL — ABNORMAL HIGH (ref 8–23)
CO2: 22 mmol/L (ref 22–32)
Calcium: 8.8 mg/dL — ABNORMAL LOW (ref 8.9–10.3)
Chloride: 97 mmol/L — ABNORMAL LOW (ref 98–111)
Creatinine, Ser: 7.99 mg/dL — ABNORMAL HIGH (ref 0.61–1.24)
GFR, Estimated: 7 mL/min — ABNORMAL LOW (ref >60)
Glucose, Bld: 108 mg/dL — ABNORMAL HIGH (ref 70–99)
Potassium: 4.6 mmol/L (ref 3.5–5.1)
Sodium: 137 mmol/L (ref 135–145)

## 2024-05-04 LAB — CBC WITH DIFFERENTIAL/PLATELET
Abs Immature Granulocytes: 0.04 K/uL (ref 0.00–0.07)
Basophils Absolute: 0 K/uL (ref 0.0–0.1)
Basophils Relative: 0 %
Eosinophils Absolute: 0.2 K/uL (ref 0.0–0.5)
Eosinophils Relative: 2 %
HCT: 29 % — ABNORMAL LOW (ref 39.0–52.0)
Hemoglobin: 8.9 g/dL — ABNORMAL LOW (ref 13.0–17.0)
Immature Granulocytes: 0 %
Lymphocytes Relative: 10 %
Lymphs Abs: 1 K/uL (ref 0.7–4.0)
MCH: 30.2 pg (ref 26.0–34.0)
MCHC: 30.7 g/dL (ref 30.0–36.0)
MCV: 98.3 fL (ref 80.0–100.0)
Monocytes Absolute: 0.2 K/uL (ref 0.1–1.0)
Monocytes Relative: 2 %
Neutro Abs: 8.2 K/uL — ABNORMAL HIGH (ref 1.7–7.7)
Neutrophils Relative %: 86 %
Platelets: 129 K/uL — ABNORMAL LOW (ref 150–400)
RBC: 2.95 MIL/uL — ABNORMAL LOW (ref 4.22–5.81)
RDW: 14.9 % (ref 11.5–15.5)
WBC: 9.6 K/uL (ref 4.0–10.5)
nRBC: 0 % (ref 0.0–0.2)

## 2024-05-04 LAB — GLUCOSE, CAPILLARY: Glucose-Capillary: 77 mg/dL (ref 70–99)

## 2024-05-04 LAB — VANCOMYCIN, RANDOM: Vancomycin Rm: 6 ug/mL

## 2024-05-04 MED ORDER — VANCOMYCIN HCL 1750 MG/350ML IV SOLN
1750.0000 mg | Freq: Once | INTRAVENOUS | Status: AC
  Administered 2024-05-04: 1750 mg via INTRAVENOUS
  Filled 2024-05-04 (×2): qty 350

## 2024-05-04 MED ORDER — ACETAMINOPHEN 10 MG/ML IV SOLN
1000.0000 mg | Freq: Once | INTRAVENOUS | Status: AC
  Administered 2024-05-04: 1000 mg via INTRAVENOUS
  Filled 2024-05-04: qty 100

## 2024-05-04 MED ORDER — VANCOMYCIN HCL 750 MG/150ML IV SOLN
750.0000 mg | INTRAVENOUS | Status: DC
  Filled 2024-05-04: qty 150

## 2024-05-04 MED ORDER — VANCOMYCIN HCL 750 MG/150ML IV SOLN: 750.0000 mg | INTRAVENOUS | Status: DC

## 2024-05-04 MED ORDER — HEPARIN SODIUM (PORCINE) 1000 UNIT/ML IJ SOLN
INTRAMUSCULAR | Status: AC
  Filled 2024-05-04: qty 4

## 2024-05-04 MED ORDER — VANCOMYCIN HCL 2000 MG/400ML IV SOLN: 2000.0000 mg | Freq: Once | INTRAVENOUS | Status: DC

## 2024-05-04 NOTE — Progress Notes (Signed)
 Hemodialysis Note:  Received patient in bed to unit. Alert, oriented to person Informed consent singed and in chart.  Treatment initiated: 1230 Treatment completed: 1600  Access used: Right IJ catheter Access issues: Rinsed back 20 minutes early due to system clot  Patient tolerated well. Transported back to room, alert without acute distress. Report given to patient's RN.  Total UF removed: 1.5 liters Medications given: None  Post HD weight: 73.4 Kg  [facility]

## 2024-05-04 NOTE — Telephone Encounter (Signed)
 Copied from CRM #[id number]. Topic: Clinical Concerns - Medical Question >> May 04, 2024  9:57 AM Rollen DEL, CNA wrote: Delray, Reza is calling other request    Include all details related to the request(s) bel Patient wife called to report that patient has been admitted to [facility]. Wife reports that patient has a high fever and is sepsis.  Confirm and type the Best Contact Number below: Patient/caller contact number:     [phone]        [] Home  [x] Mobile  [] Work [] Other   [x] Okay to leave a voicemail   Medication List:  Current Outpatient Medications:  .  acetaminophen  (TYLENOL ) 325 mg tablet, Take 650 mg by mouth every 6 (six) hours as needed for mild pain (1-3)., Disp: , Rfl:  .  aspirin  (Ecotrin Low Strength) 81 mg EC tablet, Take 81 mg by mouth daily., Disp: , Rfl:  .  atorvastatin  (LIPITOR) 20 mg tablet, Take 1 tablet (20 mg total) by mouth every morning., Disp: 30 tablet, Rfl: 11 .  blood-glucose meter misc, Use three times a day, Disp: 1 each, Rfl: 0 .  carvediloL  (COREG ) 3.125 mg tablet, Take 1 tablet (3.125 mg total) by mouth in the morning and 1 tablet (3.125 mg total) in the evening. Take with meals., Disp: 60 tablet, Rfl: 11 .  dorzolamide (TRUSOPT) 2 % ophthalmic solution, 1 drop 3 (three) times a day., Disp: , Rfl:  .  ergocalciferol  (VITAMIN D2) 1,250 mcg (50,000 unit) capsule, Take 1 capsule by mouth once a week, Disp: 12 capsule, Rfl: 1 .  gabapentin  (NEURONTIN ) 600 mg tablet, Take 1 tablet (600 mg total) by mouth 2 (two) times a day., Disp: 60 tablet, Rfl: 11 .  glucose blood (OneTouch Verio test strips) test strip, Use three times a day, Disp: 100 each, Rfl: 3 .  Lancets misc, 1 each by miscellaneous route 2 (two) times a day., Disp: 100 each, Rfl: 1 .  latanoprost  (XALATAN ) 0.005 % ophthalmic solution, Administer 1 drop into each eyes at bedtime., Disp: , Rfl:  .  levothyroxine  (SYNTHROID ) 50 mcg tablet, Take 1 tablet (50 mcg total) by mouth  daily., Disp: 30 tablet, Rfl: 11 .  nitroglycerin  (NITROSTAT ) 0.4 mg SL tablet, Place 0.4 mg under the tongue every 5 (five) minutes as needed for chest pain., Disp: 25 tablet, Rfl: 1 .  oxyCODONE  (ROXICODONE ) 5 mg immediate release tablet, Take 1 tablet (5 mg total) by mouth every 12 (twelve) hours as needed for moderate pain (4-6) or severe pain (7-10)., Disp: 60 tablet, Rfl: 0 .  sevelamer  carbonate (RENVELA ) 800 mg tablet, Take 1 tablet by mouth in the morning and 1 tablet at noon and 1 tablet in the evening. Take with meals., Disp: , Rfl:      Medication Request/Refills: Pharmacy Information (if applicable)   [x] Not Applicable       []  Pharmacy listed  Send Medication Request to:                                                 [] Pharmacy not listed (added to pharmacy list in Epic) Send Medication Request to:      Listed Pharmacies: [facility] [address] [place], [place] - [address] [address] - PHONE: [phone] - FAX: [phone]

## 2024-05-04 NOTE — Plan of Care (Signed)

## 2024-05-04 NOTE — TOC CM/SW Note (Signed)
 Went by the room to talk to patient about SNF. He is off the unit for HD. Will try again later.  Lauraine Carpen, CSW [phone]

## 2024-05-04 NOTE — Progress Notes (Signed)
 [facility]  ROUNDING NOTE   Subjective:   Ill appearing today Arousable Complains of weakness Poor appetite  Febrile overnight  Objective:  Vital signs in last 24 hours:  Temp:  [98.3 F (36.8 C)-103.1 F (39.5 C)] 100.2 F (37.9 C) (12/02 1105) Pulse Rate:  [84-99] 97 (12/02 0946) Resp:  [18-20] 18 (12/02 0946) BP: (110-119)/(51-70) 111/58 (12/02 1105) SpO2:  [94 %-97 %] 96 % (12/02 1105)  Weight change:  Filed Weights   05/01/24 0729 05/01/24 1149  Weight: 80.7 kg 83.3 kg    Intake/Output: I/O last 3 completed shifts: In: 2213.9 [P.O.:1680; IV Piggyback:533.9] Out: -    Intake/Output this shift:  No intake/output data recorded.  Physical Exam: General: Ill appearing  Head: Normocephalic, atraumatic. Moist oral mucosal membranes  Eyes: Anicteric  Lungs:  Clear to auscultation, normal effort  Heart: Regular rate and rhythm  Abdomen:  Soft, nontender  Extremities:  No peripheral edema. Lt BKA  Neurologic: Awake, alert, conversant  Skin: Warm,dry, no rash. NPWV-Left lateral BKA  Access: Rt internal jugular permcath    Basic Metabolic Panel: Recent Labs  Lab 05/01/24 0731 05/02/24 0550 05/04/24 0535  NA 142 138 137  K 3.8 3.9 4.6  CL 100 100 97*  CO2 30 25 22   GLUCOSE 145* 125* 108*  BUN 32* 33* 57*  CREATININE 4.87* 5.31* 7.99*  CALCIUM  8.8* 8.3* 8.8*    Liver Function Tests: Recent Labs  Lab 05/01/24 0731  AST 39  ALT 19  ALKPHOS 238*  BILITOT 0.7  PROT 7.6  ALBUMIN  3.4*   Recent Labs  Lab 05/01/24 0731  LIPASE 41   No results for input(s): AMMONIA in the last 168 hours.  CBC: Recent Labs  Lab 05/01/24 0731 05/02/24 0550 05/04/24 0535  WBC 8.8 14.0* 9.6  NEUTROABS 7.3  --  8.2*  HGB 9.0* 8.5* 8.9*  HCT 29.1* 27.7* 29.0*  MCV 99.3 101.1* 98.3  PLT 126* 95* 129*    Cardiac Enzymes: No results for input(s): CKTOTAL, CKMB, CKMBINDEX, TROPONINI in the last 168 hours.  BNP: Invalid input(s):  POCBNP  CBG: No results for input(s): GLUCAP in the last 168 hours.  Microbiology: Results for orders placed or performed during the hospital encounter of 05/01/24  Blood Culture (routine x 2)     Status: None (Preliminary result)   Collection Time: 05/01/24  7:44 AM   Specimen: BLOOD RIGHT FOREARM  Result Value Ref Range Status   Specimen Description BLOOD RIGHT FOREARM  Final   Special Requests   Final    BOTTLES DRAWN AEROBIC AND ANAEROBIC Blood Culture adequate volume   Culture   Final    NO GROWTH 3 DAYS Performed at [facility], [address], [place], [place] [address]    Report Status PENDING  Incomplete  Blood Culture (routine x 2)     Status: None (Preliminary result)   Collection Time: 05/01/24  7:44 AM   Specimen: Right Antecubital; Blood  Result Value Ref Range Status   Specimen Description RIGHT ANTECUBITAL  Final   Special Requests   Final    BOTTLES DRAWN AEROBIC AND ANAEROBIC Blood Culture adequate volume   Culture   Final    NO GROWTH 3 DAYS Performed at [facility], [address] [place], [place] [address]    Report Status PENDING  Incomplete    Coagulation Studies: No results for input(s): LABPROT, INR in the last 72 hours.   Urinalysis: No results for input(s): COLORURINE, LABSPEC,  PHURINE, GLUCOSEU, HGBUR, BILIRUBINUR, KETONESUR, PROTEINUR, UROBILINOGEN, NITRITE, LEUKOCYTESUR in the last 72 hours.  Invalid input(s): APPERANCEUR    Imaging: CT HEAD WO CONTRAST ( ) Result Date: 05/04/2024 EXAM: CT HEAD WITHOUT 05/03/2024 07:12:24 PM TECHNIQUE: CT of the head was performed without the administration of intravenous contrast. Automated exposure control, iterative reconstruction, and/or weight based adjustment of the mA/kV was utilized to reduce the radiation dose to as low as reasonably achievable. COMPARISON: 05/01/2024 CLINICAL HISTORY: Mental status change, unknown cause FINDINGS: BRAIN AND  VENTRICLES: No acute intracranial hemorrhage. No mass effect or midline shift. No extra-axial fluid collection. No evidence of acute infarct. No hydrocephalus. Chronic microvascular ischemic disease. ORBITS: No acute abnormality. SINUSES AND MASTOIDS: No acute abnormality. SOFT TISSUES AND SKULL: No acute skull fracture. No acute soft tissue abnormality. IMPRESSION: 1. No acute intracranial abnormality. 2. Chronic microvascular ischemic disease Electronically signed by: Franky Stanford MD 05/04/2024 01:05 AM EST RP Workstation: [id number]   IR EXCHANGE BILIARY DRAIN Result Date: 05/03/2024 INDICATION: 68 year old male with a history of calculus cholecystitis and a chronic indwelling percutaneous cholecystostomy tube. He was recently admitted with acute fever and is undergoing workup for sepsis. Request is made for early cholecystostomy tube exchange. EXAM: Cholecystostomy tube exchange MEDICATIONS: None ANESTHESIA/SEDATION: None FLUOROSCOPY: Radiation Exposure Index (as provided by the fluoroscopic device): 3.4 mGy Kerma COMPLICATIONS: None immediate. PROCEDURE: Informed written consent was obtained from the patient after a thorough discussion of the procedural risks, benefits and alternatives. All questions were addressed. Maximal Sterile Barrier Technique was utilized including caps, mask, sterile gowns, sterile gloves, sterile drape, hand hygiene and skin antiseptic. A timeout was performed prior to the initiation of the procedure. Contrast injection confirms the existing tube is patent and in good position. The gallbladder is contracted around the tube. This results in some of the injectate leaking out around the tube at the skin entry site. The retention suture was cut. The tube was transected and removed over an Amplatz wire. A new 12 French skater drain was advanced over the wire and formed. Contrast injection was performed confirming good position within the gallbladder lumen. The cystic duct remains  occluded. IMPRESSION: Successful exchange for a new 97 French percutaneous cholecystostomy tube. Electronically Signed   By: Wilkie Lent M.D.   On: 05/03/2024 15:46     Medications:    cefTRIAXone  (ROCEPHIN )  IV 2 g (05/03/24 1533)   metronidazole  500 mg (05/04/24 0325)    aspirin  EC  81 mg Oral Daily   atorvastatin   20 mg Oral Daily   B-complex with vitamin C  1 tablet Oral Daily   Chlorhexidine  Gluconate Cloth  6 each Topical Q0600   clopidogrel   75 mg Oral Daily   gabapentin   600 mg Oral BID   heparin   5,000 Units Subcutaneous Q8H   isosorbide  mononitrate  120 mg Oral Daily   levothyroxine   50 mcg Oral Q0600   sevelamer  carbonate  800 mg Oral TID WC   tamsulosin   0.4 mg Oral Daily   acetaminophen  **OR** acetaminophen , hydrALAZINE , iohexol , ondansetron  **OR** ondansetron  (ZOFRAN ) IV, mouth rinse, oxyCODONE , senna-docusate  Assessment/ Plan:  [name] is a 68 y.o.  male with past medical history of hyperlipidemia, neuropathy, recent acute cholecystitis with percutaneous tube insertion, PAD with PCI, and end-stage renal disease on hemodialysis, who was admitted to [facility] on 05/01/2024 for SIRS (systemic inflammatory response syndrome) (HCC) [R65.10] Right upper quadrant abdominal pain [R10.11] Hydronephrosis, unspecified hydronephrosis type [N13.30]   End stage renal disease on hemodialysis.SABRA  Scheduled for dialysis later today.    Fever, unknown origin. Associated symptoms of tachycardia and chills. Septic workup in progress. Blood cultures remain negative. Permcath placed 2 weeks ago, per patient. Unlikely permcath source.  IR performed chole drain exchange on 12/1  3. Anemia of chronic kidney disease Lab Results  Component Value Date   HGB 8.9 (L) 05/04/2024    Hgb borderline. Will order low dose Retacrit 4000 units with dialysis.   4. Secondary Hyperparathyroidism: with outpatient labs: none available Lab Results  Component Value Date   PTH 332 (H)  08/13/2023   CALCIUM  8.8 (L) 05/04/2024   CAION 0.99 (L) 04/12/2024   PHOS 4.3 04/15/2024    Will continue to monitor bone minerals   LOS: 3 Phillip [name] 12/2/202512:02 PM

## 2024-05-04 NOTE — Progress Notes (Addendum)
 Pharmacy Antibiotic Note  Phillip [name] is a 68 y.o. male admitted on 05/01/2024 with sepsis and intra-abdominal infection. PMH signficant for end-stage renal disease on hemodialysis (TTS), history of severe aortic stenosis status post TAVR, hyperlipidemia, neuropathy, hypothyroid, recent acute cholecystitis status post percutaneous cholecystostomy tube placement, PAD status post PCI and ambulation with BKA of the left leg. Pharmacy has been consulted for Vancomycin  dosing.  12/2: Checked vancomycin  random level after HD session since patient received Vancomycin  1000 mg dose on 11/29, with no repeat doses since. Vancomycin  random level came back 6 mcg/mL, so will give another loading dose again and start maintenance regimen after.    Plan: - Will give Vancomycin  1750 mg IV loading dose x 1 - Will start Vancomycin  maintenance dose 750 mg IV after each HD session on TTS  - Patient also receiving Ceftriaxone  2 g IV q24h and Flagyl  500 mg IV q12h   - Will continue to monitor renal function and signs of clinical improvement    Height: 6' (182.9 cm) Weight: 79.9 kg (176 lb 2.4 oz) IBW/kg (Calculated) : 77.6  Temp (24hrs), Avg:100.9 F (38.3 C), Min:98.3 F (36.8 C), Max:103.1 F (39.5 C)  Recent Labs  Lab 05/01/24 0731 05/01/24 1510 05/01/24 1738 05/02/24 0550 05/04/24 0535  WBC 8.8  --   --  14.0* 9.6  CREATININE 4.87*  --   --  5.31* 7.99*  LATICACIDVEN 1.4 3.1* 2.3*  --   --     Estimated Creatinine Clearance: 9.7 mL/min (A) (by C-G formula based on SCr of 7.99 mg/dL (H)).    No Known Allergies  Antimicrobials this admission: CRO 11/30 >> 12/7 Flagyl  11/29 >> 12/6 Vancomycin  12/2 >>  Dose adjustments this admission:   Microbiology results: 11/29 BCx: NGTD   Thank you for allowing pharmacy to be a part of this patient's care.   Ransom Blanch PGY-1 Pharmacy Resident  [place] [facility]  05/04/2024 4:26 PM

## 2024-05-04 NOTE — Plan of Care (Signed)
  Problem: Health Behavior/Discharge Planning: Goal: Ability to manage health-related needs will improve Outcome: Progressing   Problem: Clinical Measurements: Goal: Ability to maintain clinical measurements within normal limits will improve Outcome: Progressing   Problem: Clinical Measurements: Goal: Diagnostic test results will improve Outcome: Progressing   

## 2024-05-04 NOTE — Progress Notes (Signed)
 PROGRESS NOTE    Phillip [name]  FMW:[id number] DOB: 1955-10-25 DOA: 05/01/2024 PCP: [name] LABOR., MD    Brief Narrative:   68 year old male with history of end-stage renal disease on hemodialysis, history of severe aortic stenosis status post TAVR, hyperlipidemia, neuropathy, hypothyroid, recent acute cholecystitis status post percutaneous cholecystostomy tube placement, PAD status post PCI and ambulation with BKA of the left leg, who presents to the ED for chief concerns of right sided abdominal pain.    Assessment & Plan:   Principal Problem:   SIRS (systemic inflammatory response syndrome) (HCC) Active Problems:   Right sided weakness   CAD (coronary artery disease)   Hypothyroidism (acquired)   Essential hypertension   ESRD on dialysis [facility])   Atherosclerosis of native arteries of the extremities with ulceration (HCC)   Diabetes mellitus without complication (HCC)   Hyperlipidemia associated with type 2 diabetes mellitus (HCC)   Pulmonary HTN (HCC)   S/P TAVR (transcatheter aortic valve replacement)   Protein-calorie malnutrition, severe   S/P BKA (below knee amputation) unilateral, left (HCC)   Elevated troponin  SIRS (systemic inflammatory response syndrome) (HCC) Patient met SIRS criteria with fever, Tmax of 102.7, increased respiration rate, increased heart rate Organ involvement likely neurological/altered mentation with possible source of intra-abdominal in setting of cholecystostomy tube versus left BKA Interventional radiology has been consulted by EDP, Dr. Jenna is aware Per EDP, IR, Dr. Jenna states that IR will see the patient Plan: Continue with broad-spectrum IV antibiotics for intra-abdominal coverage.  Ceftriaxone  and metronidazole  for now.  Follow blood cultures, no growth to date.  Maps are maintaining.  No signs of shock physiology.  12/2: The patient did have a recorded fever during dialysis.  Tmax 102.5 recorded 1219 today.  Will need to  discuss with nephrology and consider line removal.  In the meantime we will expand antibiotic coverage and add vancomycin    Right sided weakness Of the right upper and right lower extremity weakness With change in mental status Unclear how long this has been going on Patient spouse reports that patient is right-handed.  He just had physical therapy so sometimes after physical therapy he does have right-sided weakness.  However normally on the right leg, he is able to move without difficulty CT head wo contrast: Advanced chronic small vessel disease, including age-indeterminate involvement of the right thalamus MRI brain negative Plan: Therapy service consulted   CAD (coronary artery disease) Aspirin  Plavix  resumed   ESRD on dialysis [facility]) Had a short run of hemodialysis on 11/29.  Rapid response called for febrile episode.  Dialysis on 12/2.  Fever reported during dialysis.   Essential hypertension Home regimen   Hypothyroidism (acquired) Home levothyroxine  50 mcg daily resume   Elevated troponin Suspect demand ischemia in setting of elevated heart rate   S/P BKA (below knee amputation) unilateral, left (HCC) Wound VAC in place   Hyperlipidemia associated with type 2 diabetes mellitus (HCC) Home atorvastatin  20 mg daily resumed   Diabetes mellitus without complication (HCC) Labs A1c was 4.4.  Insulin  SSI with at bedtime coverage is not indicated at this time     DVT prophylaxis: SQH Code Status: Full Family Communication: Spouse at bedside 11/30, via phone 12/1, 12/2 Disposition Plan: Status is: Inpatient Remains inpatient appropriate because: Sepsis   Level of care: Progressive  Consultants:  Nephrology  Procedures:  None  Antimicrobials: Rocephin  Metronidazole    Subjective: Seen and examined.  Appears lethargic and confused this morning  Objective: Vitals:   05/04/24  1315 05/04/24 1400 05/04/24 1430 05/04/24 1500  BP: (!) 95/41 106/61 (!) 103/48  121/66  Pulse: 94 70 89 61  Resp: 15 14 15 15   Temp:      TempSrc:      SpO2: 95% 99% 100% 100%  Weight:      Height:        Intake/Output Summary (Last 24 hours) at 05/04/2024 1535 Last data filed at 05/04/2024 0400 Gross per 24 hour  Intake 820 ml  Output --  Net 820 ml   Filed Weights   05/01/24 0729 05/01/24 1149 05/04/24 1219  Weight: 80.7 kg 83.3 kg 79.9 kg    Examination:  General exam: Lethargic, confused Respiratory system: Bilateral crackles.  Normal work of breathing.  Room air Cardiovascular system: S1-S2, RRR, no murmurs, no pedal edema Gastrointestinal system:, NT/ND, normal bowel sounds, no cholecystostomy Central nervous system: Alert but lethargic.  Confused.  No focal deficits Extremities: Left BKA with wound VAC Skin: No rashes, lesions or ulcers Psychiatry: Judgement and insight appear impaired. Mood & affect confused.     Data Reviewed: I have personally reviewed following labs and imaging studies  CBC: Recent Labs  Lab 05/01/24 0731 05/02/24 0550 05/04/24 0535  WBC 8.8 14.0* 9.6  NEUTROABS 7.3  --  8.2*  HGB 9.0* 8.5* 8.9*  HCT 29.1* 27.7* 29.0*  MCV 99.3 101.1* 98.3  PLT 126* 95* 129*   Basic Metabolic Panel: Recent Labs  Lab 05/01/24 0731 05/02/24 0550 05/04/24 0535  NA 142 138 137  K 3.8 3.9 4.6  CL 100 100 97*  CO2 30 25 22   GLUCOSE 145* 125* 108*  BUN 32* 33* 57*  CREATININE 4.87* 5.31* 7.99*  CALCIUM  8.8* 8.3* 8.8*   GFR: Estimated Creatinine Clearance: 9.7 mL/min (A) (by C-G formula based on SCr of 7.99 mg/dL (H)). Liver Function Tests: Recent Labs  Lab 05/01/24 0731  AST 39  ALT 19  ALKPHOS 238*  BILITOT 0.7  PROT 7.6  ALBUMIN  3.4*   Recent Labs  Lab 05/01/24 0731  LIPASE 41   No results for input(s): AMMONIA in the last 168 hours. Coagulation Profile: Recent Labs  Lab 05/01/24 0731  INR 1.1   Cardiac Enzymes: No results for input(s): CKTOTAL, CKMB, CKMBINDEX, TROPONINI in the last 168  hours. BNP (last 3 results) Recent Labs    07/14/23 1412  PROBNP 11,269*   HbA1C: No results for input(s): HGBA1C in the last 72 hours. CBG: No results for input(s): GLUCAP in the last 168 hours. Lipid Profile: No results for input(s): CHOL, HDL, LDLCALC, TRIG, CHOLHDL, LDLDIRECT in the last 72 hours. Thyroid  Function Tests: No results for input(s): TSH, T4TOTAL, FREET4, T3FREE, THYROIDAB in the last 72 hours. Anemia Panel: No results for input(s): VITAMINB12, FOLATE, FERRITIN, TIBC, IRON , RETICCTPCT in the last 72 hours. Sepsis Labs: Recent Labs  Lab 05/01/24 0731 05/01/24 1510 05/01/24 1738 05/02/24 0905  PROCALCITON  --   --   --  100.00  LATICACIDVEN 1.4 3.1* 2.3*  --     Recent Results (from the past 240 hours)  Blood Culture (routine x 2)     Status: None (Preliminary result)   Collection Time: 05/01/24  7:44 AM   Specimen: BLOOD RIGHT FOREARM  Result Value Ref Range Status   Specimen Description BLOOD RIGHT FOREARM  Final   Special Requests   Final    BOTTLES DRAWN AEROBIC AND ANAEROBIC Blood Culture adequate volume   Culture   Final    NO GROWTH  3 DAYS Performed at [facility], [address] [place], [place] [address]    Report Status PENDING  Incomplete  Blood Culture (routine x 2)     Status: None (Preliminary result)   Collection Time: 05/01/24  7:44 AM   Specimen: Right Antecubital; Blood  Result Value Ref Range Status   Specimen Description RIGHT ANTECUBITAL  Final   Special Requests   Final    BOTTLES DRAWN AEROBIC AND ANAEROBIC Blood Culture adequate volume   Culture   Final    NO GROWTH 3 DAYS Performed at [facility], [address] [place], [place] [address]    Report Status PENDING  Incomplete         Radiology Studies: CT HEAD WO CONTRAST ( ) Result Date: 05/04/2024 EXAM: CT HEAD WITHOUT 05/03/2024 07:12:24 PM TECHNIQUE: CT of the head was performed without the  administration of intravenous contrast. Automated exposure control, iterative reconstruction, and/or weight based adjustment of the mA/kV was utilized to reduce the radiation dose to as low as reasonably achievable. COMPARISON: 05/01/2024 CLINICAL HISTORY: Mental status change, unknown cause FINDINGS: BRAIN AND VENTRICLES: No acute intracranial hemorrhage. No mass effect or midline shift. No extra-axial fluid collection. No evidence of acute infarct. No hydrocephalus. Chronic microvascular ischemic disease. ORBITS: No acute abnormality. SINUSES AND MASTOIDS: No acute abnormality. SOFT TISSUES AND SKULL: No acute skull fracture. No acute soft tissue abnormality. IMPRESSION: 1. No acute intracranial abnormality. 2. Chronic microvascular ischemic disease Electronically signed by: Franky Stanford MD 05/04/2024 01:05 AM EST RP Workstation: [id number]   IR EXCHANGE BILIARY DRAIN Result Date: 05/03/2024 INDICATION: 68 year old male with a history of calculus cholecystitis and a chronic indwelling percutaneous cholecystostomy tube. He was recently admitted with acute fever and is undergoing workup for sepsis. Request is made for early cholecystostomy tube exchange. EXAM: Cholecystostomy tube exchange MEDICATIONS: None ANESTHESIA/SEDATION: None FLUOROSCOPY: Radiation Exposure Index (as provided by the fluoroscopic device): 3.4 mGy Kerma COMPLICATIONS: None immediate. PROCEDURE: Informed written consent was obtained from the patient after a thorough discussion of the procedural risks, benefits and alternatives. All questions were addressed. Maximal Sterile Barrier Technique was utilized including caps, mask, sterile gowns, sterile gloves, sterile drape, hand hygiene and skin antiseptic. A timeout was performed prior to the initiation of the procedure. Contrast injection confirms the existing tube is patent and in good position. The gallbladder is contracted around the tube. This results in some of the injectate leaking out  around the tube at the skin entry site. The retention suture was cut. The tube was transected and removed over an Amplatz wire. A new 12 French skater drain was advanced over the wire and formed. Contrast injection was performed confirming good position within the gallbladder lumen. The cystic duct remains occluded. IMPRESSION: Successful exchange for a new 71 French percutaneous cholecystostomy tube. Electronically Signed   By: Wilkie Lent M.D.   On: 05/03/2024 15:46        Scheduled Meds:  aspirin  EC  81 mg Oral Daily   atorvastatin   20 mg Oral Daily   B-complex with vitamin C  1 tablet Oral Daily   Chlorhexidine  Gluconate Cloth  6 each Topical Q0600   clopidogrel   75 mg Oral Daily   gabapentin   600 mg Oral BID   heparin   5,000 Units Subcutaneous Q8H   isosorbide  mononitrate  120 mg Oral Daily   levothyroxine   50 mcg Oral Q0600   sevelamer  carbonate  800 mg Oral TID WC   tamsulosin   0.4 mg Oral  Daily   Continuous Infusions:  cefTRIAXone  (ROCEPHIN )  IV 2 g (05/03/24 1533)   metronidazole  500 mg (05/04/24 0325)     LOS: 3 days      Calvin KATHEE Robson, MD [facility]   If 7PM-7AM, please contact night-coverage  05/04/2024, 3:35 PM

## 2024-05-05 DIAGNOSIS — R509 Fever, unspecified: Secondary | ICD-10-CM | POA: Diagnosis not present

## 2024-05-05 DIAGNOSIS — R6510 Systemic inflammatory response syndrome (SIRS) of non-infectious origin without acute organ dysfunction: Secondary | ICD-10-CM | POA: Diagnosis not present

## 2024-05-05 DIAGNOSIS — R109 Unspecified abdominal pain: Secondary | ICD-10-CM | POA: Diagnosis not present

## 2024-05-05 MED ORDER — ATORVASTATIN CALCIUM 10 MG PO TABS
20.0000 mg | ORAL_TABLET | Freq: Every day | ORAL | Status: DC
  Filled 2024-05-05: qty 2

## 2024-05-05 NOTE — Progress Notes (Addendum)
 Progress Note    Phillip [name]  FMW:[id number] DOB: [birth year]  DOA: 05/01/2024 PCP: [name] LABOR., MD      Brief Narrative:    Medical records reviewed and are as summarized below:  Phillip [name] is a 68 y.o. male with history of end-stage renal disease on hemodialysis, history of severe aortic stenosis status post TAVR, hyperlipidemia, neuropathy, hypothyroid, recent acute cholecystitis status post percutaneous cholecystostomy tube placement, PAD status post PCI, BKA of the left leg, who presented to the hospital because of right-sided abdominal pain.    Vitals in the ED showed Tmax of 102.7, rr 23, hr 126, blood pressure 132/85, SpO2 100% on room air.   ED treatment: He was started on empiric IV antibiotics and IV fluids.     Assessment/Plan:   Principal Problem:   SIRS (systemic inflammatory response syndrome) (HCC) Active Problems:   Right sided weakness   CAD (coronary artery disease)   Hypothyroidism (acquired)   Essential hypertension   ESRD on dialysis [facility])   Atherosclerosis of native arteries of the extremities with ulceration (HCC)   Diabetes mellitus without complication (HCC)   Hyperlipidemia associated with type 2 diabetes mellitus (HCC)   Pulmonary HTN (HCC)   S/P TAVR (transcatheter aortic valve replacement)   Protein-calorie malnutrition, severe   S/P BKA (below knee amputation) unilateral, left (HCC)   Elevated troponin    Body mass index is 21.95 kg/m.    SIRS, recurrent fever, probable sepsis: Initial concern for intra-abdominal infection because of presence of cholecystostomy tube. S/p exchange of percutaneous cholecystostomy tube on 05/03/2024. Patient still had recurrent fever after cholecystostomy exchange. He has right IJ permacath and patient and his wife said that the permacath has to be removed. Consulted Dr. Epifanio, ID specialist to assist with management Continue empiric IV antibiotics. Procalcitonin was 100 on  05/02/2024   ESRD on HD, anemia of chronic disease: Rapid response was called for fever during dialysis on 05/01/2024.  He had a recurrent fever on 05/04/2024 at hemodialysis. Patient and wife said right IJ PermCath has to be removed and that he can use his left arm fistula for dialysis.  I have notified Dr. Marcelino, nephrologist.   Right-sided weakness: No acute stroke on CT head.  Continue PT.   S/p left BKA: Wound VAC in place   Comorbidities include hypertension, CAD s/p CABG on aspirin  and Plavix , hypothyroidism, hyperlipidemia, diabetes mellitus, history of aortic stenosis s/p TAVR    Diet Order             Diet renal with fluid restriction Fluid restriction: 1200 mL Fluid; Room service appropriate? Yes; Fluid consistency: Thin  Diet effective now                                  Consultants: Nephrologist ID specialist  Procedures: S/p exchange of percutaneous cholecystostomy tube on 05/03/2024.    Medications:    aspirin  EC  81 mg Oral Daily   [START ON 05/20/2024] atorvastatin   20 mg Oral Daily   B-complex with vitamin C  1 tablet Oral Daily   Chlorhexidine  Gluconate Cloth  6 each Topical Q0600   clopidogrel   75 mg Oral Daily   gabapentin   600 mg Oral BID   heparin   5,000 Units Subcutaneous Q8H   isosorbide  mononitrate  120 mg Oral Daily   levothyroxine   50 mcg Oral Q0600   sevelamer  carbonate  800  mg Oral TID WC   tamsulosin   0.4 mg Oral Daily   Continuous Infusions:  cefTRIAXone  (ROCEPHIN )  IV 2 g (05/04/24 1643)   metronidazole  500 mg (05/05/24 0307)   [START ON May 21, 2024] vancomycin        Anti-infectives (From admission, onward)    Start     Dose/Rate Route Frequency Ordered Stop   2024-05-21 1800  vancomycin  (VANCOREADY) IVPB 750 mg/150 mL  Status:  Discontinued        750 mg 150 mL/hr over 60 Minutes Intravenous Every T-Th-Sa (1800) 05/04/24 1629 05/04/24 1649   May 21, 2024 1800  vancomycin  (VANCOREADY) IVPB 750 mg/150 mL         750 mg 150 mL/hr over 60 Minutes Intravenous Every T-Th-Sa (1800) 05/04/24 1914     05/04/24 2000  vancomycin  (VANCOREADY) IVPB 1750 mg/350 mL        1,750 mg 175 mL/hr over 120 Minutes Intravenous  Once 05/04/24 1914 05/04/24 2200   05/04/24 1800  vancomycin  (VANCOREADY) IVPB 2000 mg/400 mL  Status:  Discontinued        2,000 mg 200 mL/hr over 120 Minutes Intravenous  Once 05/04/24 1629 05/04/24 1648   05/02/24 1530  cefTRIAXone  (ROCEPHIN ) 2 g in sodium chloride  0.9 % 100 mL IVPB        2 g 200 mL/hr over 30 Minutes Intravenous Every 24 hours 05/01/24 1536 05/09/24 1529   05/01/24 1545  metroNIDAZOLE  (FLAGYL ) IVPB 500 mg        500 mg 100 mL/hr over 60 Minutes Intravenous Every 12 hours 05/01/24 1536 05/08/24 1544   05/01/24 1515  ceFEPIme  (MAXIPIME ) 2 g in sodium chloride  0.9 % 100 mL IVPB        2 g 200 mL/hr over 30 Minutes Intravenous  Once 05/01/24 1503 05/01/24 1700   05/01/24 1515  metroNIDAZOLE  (FLAGYL ) IVPB 500 mg  Status:  Discontinued        500 mg 100 mL/hr over 60 Minutes Intravenous  Once 05/01/24 1503 05/01/24 1541   05/01/24 1515  vancomycin  (VANCOCIN ) IVPB 1000 mg/200 mL premix        1,000 mg 200 mL/hr over 60 Minutes Intravenous  Once 05/01/24 1503 05/01/24 2038              Family Communication/Anticipated D/C date and plan/Code Status   DVT prophylaxis: heparin  injection 5,000 Units Start: 05/01/24 2200 Place TED hose Start: 05/01/24 1537     Code Status: Full Code  Family Communication: Plan discussed with his wife over the phone Disposition Plan: Plan to discharge to SNF   Status is: Inpatient Remains inpatient appropriate because: Recurrent fever       Subjective:   Interval events noted.  He has no complaints.  He said I want to go home.  Leon, RN, and student nurse at the bedside.  Objective:    Vitals:   05/04/24 1634 05/05/24 0022 05/05/24 0353 05/05/24 0745  BP: 106/62 (!) 98/52 101/63 103/60  Pulse: 88 69 86 86  Resp:   18 18 18   Temp: 98.8 F (37.1 C) 97.6 F (36.4 C) 98.5 F (36.9 C) 98.6 F (37 C)  TempSrc: Oral     SpO2: 98% 98% 97% 99%  Weight:      Height:       No data found.   Intake/Output Summary (Last 24 hours) at 05/05/2024 1115 Last data filed at 05/05/2024 1033 Gross per 24 hour  Intake 1490 ml  Output 1550 ml  Net -60 ml  Filed Weights   05/01/24 1149 05/04/24 1219 05/04/24 1600  Weight: 83.3 kg 74.9 kg 73.4 kg    Exam:  GEN: NAD SKIN: Warm and dry.  Permacath on right upper chest (right IJ) EYES: No pallor or icterus ENT: MMM CV: RRR PULM: CTA B ABD: soft, ND, NT, +BS.  Cholecystostomy tube in RUQ CNS: AAO x 3, non focal EXT: Left BKA.  Right leg edema.        Data Reviewed:   I have personally reviewed following labs and imaging studies:  Labs: Labs show the following:   Basic Metabolic Panel: Recent Labs  Lab 05/01/24 0731 05/02/24 0550 05/04/24 0535  NA 142 138 137  K 3.8 3.9 4.6  CL 100 100 97*  CO2 30 25 22   GLUCOSE 145* 125* 108*  BUN 32* 33* 57*  CREATININE 4.87* 5.31* 7.99*  CALCIUM  8.8* 8.3* 8.8*   GFR Estimated Creatinine Clearance: 9.2 mL/min (A) (by C-G formula based on SCr of 7.99 mg/dL (H)). Liver Function Tests: Recent Labs  Lab 05/01/24 0731  AST 39  ALT 19  ALKPHOS 238*  BILITOT 0.7  PROT 7.6  ALBUMIN  3.4*   Recent Labs  Lab 05/01/24 0731  LIPASE 41   No results for input(s): AMMONIA in the last 168 hours. Coagulation profile Recent Labs  Lab 05/01/24 0731  INR 1.1    CBC: Recent Labs  Lab 05/01/24 0731 05/02/24 0550 05/04/24 0535  WBC 8.8 14.0* 9.6  NEUTROABS 7.3  --  8.2*  HGB 9.0* 8.5* 8.9*  HCT 29.1* 27.7* 29.0*  MCV 99.3 101.1* 98.3  PLT 126* 95* 129*   Cardiac Enzymes: No results for input(s): CKTOTAL, CKMB, CKMBINDEX, TROPONINI in the last 168 hours. BNP (last 3 results) Recent Labs    07/14/23 1412  PROBNP 11,269*   CBG: Recent Labs  Lab 05/04/24 1713  GLUCAP 77    D-Dimer: No results for input(s): DDIMER in the last 72 hours. Hgb A1c: No results for input(s): HGBA1C in the last 72 hours. Lipid Profile: No results for input(s): CHOL, HDL, LDLCALC, TRIG, CHOLHDL, LDLDIRECT in the last 72 hours. Thyroid  function studies: No results for input(s): TSH, T4TOTAL, T3FREE, THYROIDAB in the last 72 hours.  Invalid input(s): FREET3 Anemia work up: No results for input(s): VITAMINB12, FOLATE, FERRITIN, TIBC, IRON , RETICCTPCT in the last 72 hours. Sepsis Labs: Recent Labs  Lab 05/01/24 0731 05/01/24 1510 05/01/24 1738 05/02/24 0550 05/02/24 0905 05/04/24 0535  PROCALCITON  --   --   --   --  100.00  --   WBC 8.8  --   --  14.0*  --  9.6  LATICACIDVEN 1.4 3.1* 2.3*  --   --   --     Microbiology Recent Results (from the past 240 hours)  Blood Culture (routine x 2)     Status: None (Preliminary result)   Collection Time: 05/01/24  7:44 AM   Specimen: BLOOD RIGHT FOREARM  Result Value Ref Range Status   Specimen Description BLOOD RIGHT FOREARM  Final   Special Requests   Final    BOTTLES DRAWN AEROBIC AND ANAEROBIC Blood Culture adequate volume   Culture   Final    NO GROWTH 4 DAYS Performed at [facility], [address] [place], [place] [address]    Report Status PENDING  Incomplete  Blood Culture (routine x 2)     Status: None (Preliminary result)   Collection Time: 05/01/24  7:44 AM   Specimen: Right Antecubital;  Blood  Result Value Ref Range Status   Specimen Description RIGHT ANTECUBITAL  Final   Special Requests   Final    BOTTLES DRAWN AEROBIC AND ANAEROBIC Blood Culture adequate volume   Culture   Final    NO GROWTH 4 DAYS Performed at [facility], [address], [place], [place] [address]    Report Status PENDING  Incomplete    Procedures and diagnostic studies:  CT HEAD WO CONTRAST ( ) Result Date: 05/04/2024 EXAM: CT HEAD WITHOUT 05/03/2024 07:12:24 PM  TECHNIQUE: CT of the head was performed without the administration of intravenous contrast. Automated exposure control, iterative reconstruction, and/or weight based adjustment of the mA/kV was utilized to reduce the radiation dose to as low as reasonably achievable. COMPARISON: 05/01/2024 CLINICAL HISTORY: Mental status change, unknown cause FINDINGS: BRAIN AND VENTRICLES: No acute intracranial hemorrhage. No mass effect or midline shift. No extra-axial fluid collection. No evidence of acute infarct. No hydrocephalus. Chronic microvascular ischemic disease. ORBITS: No acute abnormality. SINUSES AND MASTOIDS: No acute abnormality. SOFT TISSUES AND SKULL: No acute skull fracture. No acute soft tissue abnormality. IMPRESSION: 1. No acute intracranial abnormality. 2. Chronic microvascular ischemic disease Electronically signed by: Franky Stanford MD 05/04/2024 01:05 AM EST RP Workstation: [id number]   IR EXCHANGE BILIARY DRAIN Result Date: 05/03/2024 INDICATION: 68 year old male with a history of calculus cholecystitis and a chronic indwelling percutaneous cholecystostomy tube. He was recently admitted with acute fever and is undergoing workup for sepsis. Request is made for early cholecystostomy tube exchange. EXAM: Cholecystostomy tube exchange MEDICATIONS: None ANESTHESIA/SEDATION: None FLUOROSCOPY: Radiation Exposure Index (as provided by the fluoroscopic device): 3.4 mGy Kerma COMPLICATIONS: None immediate. PROCEDURE: Informed written consent was obtained from the patient after a thorough discussion of the procedural risks, benefits and alternatives. All questions were addressed. Maximal Sterile Barrier Technique was utilized including caps, mask, sterile gowns, sterile gloves, sterile drape, hand hygiene and skin antiseptic. A timeout was performed prior to the initiation of the procedure. Contrast injection confirms the existing tube is patent and in good position. The gallbladder is contracted around the tube.  This results in some of the injectate leaking out around the tube at the skin entry site. The retention suture was cut. The tube was transected and removed over an Amplatz wire. A new 12 French skater drain was advanced over the wire and formed. Contrast injection was performed confirming good position within the gallbladder lumen. The cystic duct remains occluded. IMPRESSION: Successful exchange for a new 28 French percutaneous cholecystostomy tube. Electronically Signed   By: Wilkie Lent M.D.   On: 05/03/2024 15:46               LOS: 4 days   Auron Tadros  [facility]   Pager on [email or web address]. If 7PM-7AM, please contact night-coverage at [email or web address]     05/05/2024, 11:15 AM

## 2024-05-05 NOTE — Plan of Care (Signed)

## 2024-05-05 NOTE — Plan of Care (Signed)
   Problem: Education: Goal: Knowledge of General Education information will improve Description: Including pain rating scale, medication(s)/side effects and non-pharmacologic comfort measures Outcome: Progressing   Problem: Clinical Measurements: Goal: Ability to maintain clinical measurements within normal limits will improve Outcome: Progressing Goal: Diagnostic test results will improve Outcome: Progressing

## 2024-05-05 NOTE — Progress Notes (Signed)
 [facility]  ROUNDING NOTE   Subjective:   Seen resting in bed Wife at bedside Concerned about fevers Patient voices frustrations and ready for discharge  Afebrile overnight  Objective:  Vital signs in last 24 hours:  Temp:  [97.6 F (36.4 C)-98.8 F (37.1 C)] 98.3 F (36.8 C) (12/03 1253) Pulse Rate:  [61-95] 79 (12/03 1253) Resp:  [15-20] 18 (12/03 1253) BP: (98-128)/(52-68) 108/58 (12/03 1253) SpO2:  [97 %-100 %] 99 % (12/03 1253) Weight:  [73.4 kg] 73.4 kg (12/02 1600)  Weight change:  Filed Weights   05/01/24 1149 05/04/24 1219 05/04/24 1600  Weight: 83.3 kg 74.9 kg 73.4 kg    Intake/Output: I/O last 3 completed shifts: In: 2190 [P.O.:1320; IV Piggyback:870] Out: 1550 [Drains:50; Other:1500]   Intake/Output this shift:  Total I/O In: 240 [P.O.:240] Out: -   Physical Exam: General: Nad  Head: Normocephalic, atraumatic. Moist oral mucosal membranes  Eyes: Anicteric  Lungs:  Clear to auscultation, normal effort  Heart: Regular rate and rhythm  Abdomen:  Soft, nontender  Extremities:  No peripheral edema. Lt BKA  Neurologic: Awake, alert, conversant  Skin: Warm,dry, no rash. NPWV-Left lateral BKA  Access: Rt internal jugular permcath    Basic Metabolic Panel: Recent Labs  Lab 05/01/24 0731 05/02/24 0550 05/04/24 0535  NA 142 138 137  K 3.8 3.9 4.6  CL 100 100 97*  CO2 30 25 22   GLUCOSE 145* 125* 108*  BUN 32* 33* 57*  CREATININE 4.87* 5.31* 7.99*  CALCIUM  8.8* 8.3* 8.8*    Liver Function Tests: Recent Labs  Lab 05/01/24 0731  AST 39  ALT 19  ALKPHOS 238*  BILITOT 0.7  PROT 7.6  ALBUMIN  3.4*   Recent Labs  Lab 05/01/24 0731  LIPASE 41   No results for input(s): AMMONIA in the last 168 hours.  CBC: Recent Labs  Lab 05/01/24 0731 05/02/24 0550 05/04/24 0535  WBC 8.8 14.0* 9.6  NEUTROABS 7.3  --  8.2*  HGB 9.0* 8.5* 8.9*  HCT 29.1* 27.7* 29.0*  MCV 99.3 101.1* 98.3  PLT 126* 95* 129*    Cardiac  Enzymes: No results for input(s): CKTOTAL, CKMB, CKMBINDEX, TROPONINI in the last 168 hours.  BNP: Invalid input(s): POCBNP  CBG: Recent Labs  Lab 05/04/24 1713  GLUCAP 77    Microbiology: Results for orders placed or performed during the hospital encounter of 05/01/24  Blood Culture (routine x 2)     Status: None (Preliminary result)   Collection Time: 05/01/24  7:44 AM   Specimen: BLOOD RIGHT FOREARM  Result Value Ref Range Status   Specimen Description BLOOD RIGHT FOREARM  Final   Special Requests   Final    BOTTLES DRAWN AEROBIC AND ANAEROBIC Blood Culture adequate volume   Culture   Final    NO GROWTH 4 DAYS Performed at [facility], [address], [place], [place] [address]    Report Status PENDING  Incomplete  Blood Culture (routine x 2)     Status: None (Preliminary result)   Collection Time: 05/01/24  7:44 AM   Specimen: Right Antecubital; Blood  Result Value Ref Range Status   Specimen Description RIGHT ANTECUBITAL  Final   Special Requests   Final    BOTTLES DRAWN AEROBIC AND ANAEROBIC Blood Culture adequate volume   Culture   Final    NO GROWTH 4 DAYS Performed at [facility], [address], [place], [place] [address]    Report Status PENDING  Incomplete  Coagulation Studies: No results for input(s): LABPROT, INR in the last 72 hours.   Urinalysis: No results for input(s): COLORURINE, LABSPEC, PHURINE, GLUCOSEU, HGBUR, BILIRUBINUR, KETONESUR, PROTEINUR, UROBILINOGEN, NITRITE, LEUKOCYTESUR in the last 72 hours.  Invalid input(s): APPERANCEUR    Imaging: CT HEAD WO CONTRAST ( ) Result Date: 05/04/2024 EXAM: CT HEAD WITHOUT 05/03/2024 07:12:24 PM TECHNIQUE: CT of the head was performed without the administration of intravenous contrast. Automated exposure control, iterative reconstruction, and/or weight based adjustment of the mA/kV was utilized to reduce the radiation dose to as low as  reasonably achievable. COMPARISON: 05/01/2024 CLINICAL HISTORY: Mental status change, unknown cause FINDINGS: BRAIN AND VENTRICLES: No acute intracranial hemorrhage. No mass effect or midline shift. No extra-axial fluid collection. No evidence of acute infarct. No hydrocephalus. Chronic microvascular ischemic disease. ORBITS: No acute abnormality. SINUSES AND MASTOIDS: No acute abnormality. SOFT TISSUES AND SKULL: No acute skull fracture. No acute soft tissue abnormality. IMPRESSION: 1. No acute intracranial abnormality. 2. Chronic microvascular ischemic disease Electronically signed by: Franky Stanford MD 05/04/2024 01:05 AM EST RP Workstation: [id number]   IR EXCHANGE BILIARY DRAIN Result Date: 05/03/2024 INDICATION: 68 year old male with a history of calculus cholecystitis and a chronic indwelling percutaneous cholecystostomy tube. He was recently admitted with acute fever and is undergoing workup for sepsis. Request is made for early cholecystostomy tube exchange. EXAM: Cholecystostomy tube exchange MEDICATIONS: None ANESTHESIA/SEDATION: None FLUOROSCOPY: Radiation Exposure Index (as provided by the fluoroscopic device): 3.4 mGy Kerma COMPLICATIONS: None immediate. PROCEDURE: Informed written consent was obtained from the patient after a thorough discussion of the procedural risks, benefits and alternatives. All questions were addressed. Maximal Sterile Barrier Technique was utilized including caps, mask, sterile gowns, sterile gloves, sterile drape, hand hygiene and skin antiseptic. A timeout was performed prior to the initiation of the procedure. Contrast injection confirms the existing tube is patent and in good position. The gallbladder is contracted around the tube. This results in some of the injectate leaking out around the tube at the skin entry site. The retention suture was cut. The tube was transected and removed over an Amplatz wire. A new 12 French skater drain was advanced over the wire and formed.  Contrast injection was performed confirming good position within the gallbladder lumen. The cystic duct remains occluded. IMPRESSION: Successful exchange for a new 57 French percutaneous cholecystostomy tube. Electronically Signed   By: Wilkie Lent M.D.   On: 05/03/2024 15:46     Medications:    cefTRIAXone  (ROCEPHIN )  IV 2 g (05/04/24 1643)   metronidazole  500 mg (05/05/24 0307)   [START ON ] vancomycin       aspirin  EC  81 mg Oral Daily   [START ON ] atorvastatin   20 mg Oral Daily   B-complex with vitamin C  1 tablet Oral Daily   Chlorhexidine  Gluconate Cloth  6 each Topical Q0600   clopidogrel   75 mg Oral Daily   gabapentin   600 mg Oral BID   heparin   5,000 Units Subcutaneous Q8H   isosorbide  mononitrate  120 mg Oral Daily   levothyroxine   50 mcg Oral Q0600   sevelamer  carbonate  800 mg Oral TID WC   tamsulosin   0.4 mg Oral Daily   acetaminophen  **OR** acetaminophen , hydrALAZINE , iohexol , ondansetron  **OR** ondansetron  (ZOFRAN ) IV, mouth rinse, oxyCODONE , senna-docusate  Assessment/ Plan:  [name] is a 68 y.o.  male with past medical history of hyperlipidemia, neuropathy, recent acute cholecystitis with percutaneous tube insertion, PAD with PCI, and end-stage renal disease on hemodialysis, who  was admitted to [facility] on 05/01/2024 for SIRS (systemic inflammatory response syndrome) (HCC) [R65.10] Right upper quadrant abdominal pain [R10.11] Hydronephrosis, unspecified hydronephrosis type [N13.30]   End stage renal disease on hemodialysis.. Dialysis received yesterday, UF 1.5L achieved. Next treatment scheduled for Thursday.    Fever, unknown origin. Associated symptoms of tachycardia and chills. Septic workup in progress. Blood cultures remain negative. Permcath placed 2 weeks ago, per patient. Unlikely infection source.  IR performed chole drain exchange on 12/1. ID consulted. Afebrile overnight  3. Anemia of chronic kidney disease Lab Results   Component Value Date   HGB 8.9 (L) 05/04/2024    Hgb borderline. Will order low dose Retacrit  4000 units with dialysis.   4. Secondary Hyperparathyroidism: with outpatient labs: none available Lab Results  Component Value Date   PTH 332 (H) 08/13/2023   CALCIUM  8.8 (L) 05/04/2024   CAION 0.99 (L) 04/12/2024   PHOS 4.3 04/15/2024    Calcium  and phos within optimal range   LOS: 4 Savita Runner 12/3/20252:58 PM

## 2024-05-05 NOTE — Progress Notes (Signed)
 PT being transported to 1A room 147. Pt has belonging. PT transported by bed. Updated wife on transport.

## 2024-05-05 NOTE — Telephone Encounter (Signed)
 Not sure what this is regarding, let me know if I can help with it.

## 2024-05-05 NOTE — Consult Note (Signed)
 Infectious Disease     Reason for Consult:Fevers   Referring Physician: Jens Durand, MD  Date of Admission:  05/01/2024   Principal Problem:   SIRS (systemic inflammatory response syndrome) (HCC) Active Problems:   Atherosclerosis of native arteries of the extremities with ulceration (HCC)   CAD (coronary artery disease)   Diabetes mellitus without complication (HCC)   Hypothyroidism (acquired)   Essential hypertension   Hyperlipidemia associated with type 2 diabetes mellitus (HCC)   ESRD on dialysis (HCC)   Pulmonary HTN (HCC)   S/P TAVR (transcatheter aortic valve replacement)   Protein-calorie malnutrition, severe   S/P BKA (below knee amputation) unilateral, left (HCC)   Elevated troponin   Right sided weakness   HPI: Phillip [name] is a 68 y.o. male we are asked to see for recurrent fevers.  He has had a complicated last 6 months with multiple hospitalizations including for acute BKA for ischemic limb, cholecystitis status postcholecystectomy tube, TAVR placement 01/2024.   patient was admitted November 29 with right-sided abdominal pain and fever.  He has a history of end-stage renal disease on hemodialysis severe aortic stenosis status post TAVR, hyperlipidemia, neuropathy, recent acute cholecystitis status post percutaneous cholecystectomy tube placement, peripheral artery disease, history of BKA of the left leg.  On admit he is temperature was 104.  White count was 8 but increased to 14. Of note he was admitted November 10 to 12.  He had to have a left BKA for critical limb ischemia with a below the knee amputation by vascular. On this admission CT of the abdomen 1129 showed moderate right hydroureteronephrosis without stones.  Cholecystectomy tube was in place with no biliary ductal dilation. CT of the head showed advanced chronic small vessel ischemia disease.  MRI of the brain showed moderate chronic microvascular disease but no acute strokes  He has been started on  ceftriaxone  metronidazole  as well as vancomycin .  He was afebrile for the first few days but this number second spiked to 103.  Does not appear repeat blood cultures were done at that time.  Blood cultures from November 29 remain negative. He has dialysis through a right IJ permacath.   Today he reports min symptoms, feels weak. Says he had RUQ and back pain prior to admit but that is iimproving. Drain cont with SS drainage. No cough, sob. Wound vac on BKA site without issue. R JD cath wnl with no pain redness  Past Medical History:  Diagnosis Date   Abdominopelvic abscess (HCC) 09/29/2018   Abscess of appendix 09/22/2018   Acquired spondylolisthesis of lumbosacral region 03/20/2020   Acute cholecystitis 11/15/2023   Anemia of chronic disease 08/18/2019   Anemia, chronic disease 08/18/2019   Aortic atherosclerosis    Aortic stenosis    a.) TTE 09/15/2018: mild-mod (MPG 19); b.) TTE 03/21/2021: mild-mod (MPG 14.3); c.) TTE 04/24/2022: mild- mod (MPG 15)   Appendicitis 09/08/2018   Asthma    Atherosclerosis of native arteries of the extremities with ulceration (HCC) 02/24/2018   Formatting of this note might be different from the original.  Last Assessment & Plan:   His ABIs today are 1.07 on the right and 0.99 on the left with biphasic waveforms.  Although these pressures may be somewhat elevated from medial calcification, his flow does appear to be reasonably good.  His left ABI was 0.58 prior to intervention.  At this point, we will stretch out his follow-up and see hi   Benign prostatic hyperplasia without lower urinary tract symptoms  01/14/2018   Bilateral lower extremity edema 11/04/2018   Bradycardia 03/25/2018   CAD (coronary artery disease) 2003   a.) s/p 4v CABG 2003   Choledocholithiasis 11/12/2023   Chronic combined systolic and diastolic CHF (congestive heart failure) (HCC) 07/26/2015   a.) TTE 09/15/2018: EF 50-55%, mod LVH, RVE, BAE, mild-mod TR, AoV sclerosis, G1DD; b.)  MPI 10/27/2019: EF <30%; c.) TTE 03/21/2021: EF 35-40%, post AK, inf HK, mod LVH, mod red RVSF, mod LAE, mod Aov sclerosis, G2DD; c.) TTE 04/24/2022: EF 35-40%, post AK, glok HK, mod LVE, mod red RVSF, mild-mod MR, Aov sclerosis, G3DD   Chronic idiopathic constipation 11/02/2018   Chronic low back pain without sciatica 12/02/2018   Chronic pain of right hip 03/31/2020   Coronary artery disease    Diabetes mellitus without complication (HCC)    Dysphagia 07/26/2015   Encounter for long-term (current) use of insulin  (HCC) 09/27/2020   Erectile dysfunction 07/14/2017   ESRD on dialysis (HCC) 11/15/2023   Essential hypertension 02/24/2018   Gangrene of toe of left foot (HCC) 01/14/2024   Hematuria 07/27/2015   History of marijuana use    Hyperlipidemia 07/26/2015   Hyperlipidemia associated with type 2 diabetes mellitus (HCC) 02/18/2020   Hypertension    Hypothyroidism (acquired) 11/14/2015   Insomnia 11/02/2018   Left below-knee amputee (HCC) 02/05/2024   Osseous and subluxation stenosis of intervertebral foramina of lumbar region 03/20/2020   Osteoarthritis 10/06/2019   Pancytopenia (HCC) 08/12/2023   Peripheral vascular disease    Personal history of tobacco use, presenting hazards to health 09/27/2020   Polyneuropathy in diabetes (HCC) 05/07/2019   Primary osteoarthritis involving multiple joints 10/06/2019   Primary osteoarthritis of right hip 02/21/2020   Protein-calorie malnutrition, severe 02/07/2024   Pulmonary HTN (HCC)    a.) TTE 05/12/2019: PASP 71; b.) TTE 09/27/2020: PASP >70; c.) TTE 03/21/2021: RVSP 43; d.) TTE 04/24/2022: RVSP 80.6   Puncture wound of right hip 04/02/2018   S/P CABG x 4 2003   S/P TAVR (transcatheter aortic valve replacement) 01/20/2024   TAVR with a 29 mm Edwards Sapien 3 Ultra Resilia THV via the TF by Dr. Wendel and Dr. Shyrl   Sleep apnea 07/26/2015   a.) does not require nocturnal PAP therapy   Subacute osteomyelitis of left foot (HCC)  03/25/2018   Thrombocytopenia    Type 2 diabetes mellitus with hyperlipidemia (HCC) 07/26/2015   Vitamin B12 deficiency 06/16/2019   Vitamin D  deficiency 05/07/2018   Past Surgical History:  Procedure Laterality Date   AMPUTATION Left 01/14/2024   Procedure: AMPUTATION, FOOT, PARTIAL;  Surgeon: Serene Gaile ORN, MD;  Location: [facility] OR;  Service: Vascular;  Laterality: Left;   AMPUTATION Left 01/30/2024   Procedure: AMPUTATION BELOW KNEE;  Surgeon: Serene Gaile ORN, MD;  Location: [facility] OR;  Service: Vascular;  Laterality: Left;   AMPUTATION TOE Left 03/13/2018   Procedure: AMPUTATION TOE-MPJ;  Surgeon: Ashley Soulier, DPM;  Location: [facility] ORS;  Service: Podiatry;  Laterality: Left;   ANGIOPLASTY     APPLICATION OF WOUND VAC Left 04/12/2024   Procedure: APPLICATION, WOUND VAC;  Surgeon: Lanis Fonda BRAVO, MD;  Location: [facility] OR;  Service: Vascular;  Laterality: Left;   AV FISTULA PLACEMENT Left 08/20/2023   Procedure: ARTERIOVENOUS (AV) FISTULA CREATION;  Surgeon: Gretta Lonni PARAS, MD;  Location: [facility] OR;  Service: Vascular;  Laterality: Left;   CARDIAC CATHETERIZATION     CORONARY ARTERY BYPASS GRAFT N/A 2003   INCISION AND DRAINAGE OF WOUND Left 04/12/2024  Procedure: IRRIGATION AND DEBRIDEMENT WOUND;  Surgeon: Lanis Fonda BRAVO, MD;  Location: [facility] OR;  Service: Vascular;  Laterality: Left;   INTRAOPERATIVE TRANSTHORACIC ECHOCARDIOGRAM N/A 01/20/2024   Procedure: ECHOCARDIOGRAM, TRANSTHORACIC;  Surgeon: Wendel Lurena POUR, MD;  Location: [facility];  Service: Cardiovascular;  Laterality: N/A;   IR CATHETER TUBE CHANGE  12/31/2023   IR DIALY SHUNT INTRO NEEDLE/INTRACATH INITIAL W/IMG LEFT Left 02/09/2024   IR EXCHANGE BILIARY DRAIN  02/03/2024   IR EXCHANGE BILIARY DRAIN  02/17/2024   IR EXCHANGE BILIARY DRAIN  03/05/2024   IR EXCHANGE BILIARY DRAIN  05/03/2024   IR FLUORO GUIDE CV LINE RIGHT  08/18/2023   IR PERC CHOLECYSTOSTOMY  11/12/2023   IR RADIOLOGIST EVAL & MGMT  12/31/2023    IR TUNNELED CENTRAL VENOUS CATH PLC W IMG  02/23/2024   IR US  GUIDE VASC ACCESS RIGHT  08/18/2023   LAPAROSCOPIC APPENDECTOMY N/A 09/08/2018   Procedure: APPENDECTOMY LAPAROSCOPIC;  Surgeon: Jordis Laneta FALCON, MD;  Location: [facility] OR[facility];  Service: General;  Laterality: N/A;   LOWER EXTREMITY ANGIOGRAPHY Left 03/02/2018   Procedure: LOWER EXTREMITY ANGIOGRAPHY;  Surgeon: Marea Selinda RAMAN, MD;  Location: [facility];  Service: Cardiovascular;  Laterality: Left;   LOWER EXTREMITY ANGIOGRAPHY Left 09/30/2022   Procedure: Lower Extremity Angiography;  Surgeon: Marea Selinda RAMAN, MD;  Location: [facility];  Service: Cardiovascular;  Laterality: Left;   LOWER EXTREMITY ANGIOGRAPHY Left 12/26/2023   Procedure: Lower Extremity Angiography;  Surgeon: Marea Selinda RAMAN, MD;  Location: [facility];  Service: Cardiovascular;  Laterality: Left;   RIGHT/LEFT HEART CATH AND CORONARY/GRAFT ANGIOGRAPHY N/A 08/01/2023   Procedure: RIGHT/LEFT HEART CATH AND CORONARY/GRAFT ANGIOGRAPHY;  Surgeon: Wendel Lurena POUR, MD;  Location: [facility];  Service: Cardiovascular;  Laterality: N/A;   ROTATOR CUFF REPAIR Left    TRANSMETATARSAL AMPUTATION Left 01/16/2024   Procedure: AMPUTATION, FOOT, TRANSMETATARSAL;  Surgeon: Serene Gaile ORN, MD;  Location: [facility] OR;  Service: Vascular;  Laterality: Left;   Social History   Tobacco Use   Smoking status: Former    Current packs/day: 0.00    Average packs/day: 0.5 packs/day for 25.0 years (12.5 ttl pk-yrs)    Types: Cigarettes    Start date: 13    Quit date: 2003    Years since quitting: 22.9   Smokeless tobacco: Never  Vaping Use   Vaping status: Never Used  Substance Use Topics   Alcohol use: Not Currently    Comment: 2003   Drug use: Never   Family History  Problem Relation Age of Onset   Hyperlipidemia Mother    Hypertension Mother    Diabetes Mother    Heart attack Brother    Heart disease Brother    Lung disease Father     Allergies: No  Known Allergies  Current antibiotics: Antibiotics Given (last 72 hours)     Date/Time Action Medication Dose Rate   05/02/24 1613 New Bag/Given   metroNIDAZOLE  (FLAGYL ) IVPB 500 mg 500 mg 100 mL/hr   05/02/24 1801 New Bag/Given   cefTRIAXone  (ROCEPHIN ) 2 g in sodium chloride  0.9 % 100 mL IVPB 2 g 200 mL/hr   05/03/24 0338 New Bag/Given   metroNIDAZOLE  (FLAGYL ) IVPB 500 mg 500 mg 100 mL/hr   05/03/24 1533 New Bag/Given   cefTRIAXone  (ROCEPHIN ) 2 g in sodium chloride  0.9 % 100 mL IVPB 2 g 200 mL/hr   05/03/24 1633 New Bag/Given   metroNIDAZOLE  (FLAGYL ) IVPB 500 mg 500 mg 100  mL/hr   05/04/24 0325 New Bag/Given   metroNIDAZOLE  (FLAGYL ) IVPB 500 mg 500 mg 100 mL/hr   05/04/24 1643 New Bag/Given  [in hd]   cefTRIAXone  (ROCEPHIN ) 2 g in sodium chloride  0.9 % 100 mL IVPB 2 g 200 mL/hr   05/04/24 1717 New Bag/Given   metroNIDAZOLE  (FLAGYL ) IVPB 500 mg 500 mg 100 mL/hr   05/04/24 2000 New Bag/Given   vancomycin  (VANCOREADY) IVPB 1750 mg/350 mL 1,750 mg 175 mL/hr   05/05/24 0307 New Bag/Given   metroNIDAZOLE  (FLAGYL ) IVPB 500 mg 500 mg 100 mL/hr       MEDICATIONS:  aspirin  EC  81 mg Oral Daily   [START ON ] atorvastatin   20 mg Oral Daily   B-complex with vitamin C  1 tablet Oral Daily   Chlorhexidine  Gluconate Cloth  6 each Topical Q0600   clopidogrel   75 mg Oral Daily   gabapentin   600 mg Oral BID   heparin   5,000 Units Subcutaneous Q8H   isosorbide  mononitrate  120 mg Oral Daily   levothyroxine   50 mcg Oral Q0600   sevelamer  carbonate  800 mg Oral TID WC   tamsulosin   0.4 mg Oral Daily    Review of Systems - 11 systems reviewed and negative per HPI   OBJECTIVE: Temp:  [97.6 F (36.4 C)-98.8 F (37.1 C)] 98.3 F (36.8 C) (12/03 1253) Pulse Rate:  [61-95] 79 (12/03 1253) Resp:  [15-20] 18 (12/03 1253) BP: (98-128)/(52-68) 108/58 (12/03 1253) SpO2:  [97 %-100 %] 99 % (12/03 1253) Weight:  [73.4 kg] 73.4 kg (12/02 1600) Physical Exam  Constitutional: He is  oriented to person, place, and time. Sitting up in bed, frail HENT: anicteric Mouth/Throat: Oropharynx is clear and moist. No oropharyngeal exudate.  Cardiovascular: Normal rate, regular rhythm and normal heart sounds. Pulmonary/Chest: Effort normal and breath sounds normal. No respiratory distress. He has no wheezes.  Abdominal: Soft. Bowel sounds are normal. He exhibits no distension. There is no tenderness.  Drain in RUQ with thin yellow fluid.  Neurological: He is alert and oriented to person, place, and time.  Skin: Skin is warm and dry. No rash noted. No erythema.  Ext  L BKA with with wound vac without pain or redness Psychiatric: He has a normal mood and affect. His behavior is normal.   HD cath R upper chest wall wnl L stump photo 12/1   LABS: Results for orders placed or performed during the hospital encounter of 05/01/24 (from the past 48 hours)  CBC with Differential/Platelet     Status: Abnormal   Collection Time: 05/04/24  5:35 AM  Result Value Ref Range   WBC 9.6 4.0 - 10.5 K/uL   RBC 2.95 (L) 4.22 - 5.81 MIL/uL   Hemoglobin 8.9 (L) 13.0 - 17.0 g/dL   HCT 70.9 (L) 60.9 - 47.9 %   MCV 98.3 80.0 - 100.0 fL   MCH 30.2 26.0 - 34.0 pg   MCHC 30.7 30.0 - 36.0 g/dL   RDW 85.0 88.4 - 84.4 %   Platelets 129 (L) 150 - 400 K/uL   nRBC 0.0 0.0 - 0.2 %   Neutrophils Relative % 86 %   Neutro Abs 8.2 (H) 1.7 - 7.7 K/uL   Lymphocytes Relative 10 %   Lymphs Abs 1.0 0.7 - 4.0 K/uL   Monocytes Relative 2 %   Monocytes Absolute 0.2 0.1 - 1.0 K/uL   Eosinophils Relative 2 %   Eosinophils Absolute 0.2 0.0 - 0.5 K/uL   Basophils  Relative 0 %   Basophils Absolute 0.0 0.0 - 0.1 K/uL   Immature Granulocytes 0 %   Abs Immature Granulocytes 0.04 0.00 - 0.07 K/uL    Comment: Performed at [facility], [address] [place], [place] [address]  Basic metabolic panel with GFR     Status: Abnormal   Collection Time: 05/04/24  5:35 AM  Result Value Ref Range   Sodium 137 135 -  145 mmol/L   Potassium 4.6 3.5 - 5.1 mmol/L   Chloride 97 (L) 98 - 111 mmol/L   CO2 22 22 - 32 mmol/L   Glucose, Bld 108 (H) 70 - 99 mg/dL    Comment: Glucose reference range applies only to samples taken after fasting for at least 8 hours.   BUN 57 (H) 8 - 23 mg/dL   Creatinine, Ser 2.00 (H) 0.61 - 1.24 mg/dL   Calcium  8.8 (L) 8.9 - 10.3 mg/dL   GFR, Estimated 7 (L) >60 mL/min    Comment: (NOTE) Calculated using the CKD-EPI Creatinine Equation (2021)    Anion gap 18 (H) 5 - 15    Comment: Performed at [facility], [address] [place], [place] [address]  Glucose, capillary     Status: None   Collection Time: 05/04/24  5:13 PM  Result Value Ref Range   Glucose-Capillary 77 70 - 99 mg/dL    Comment: Glucose reference range applies only to samples taken after fasting for at least 8 hours.  Vancomycin , random     Status: None   Collection Time: 05/04/24  5:55 PM  Result Value Ref Range   Vancomycin  Rm 6 ug/mL    Comment: Performed at [facility], [address] [place], [place] [address]   No components found for: ESR, C REACTIVE PROTEIN MICRO: Recent Results (from the past 720 hours)  Blood Culture (routine x 2)     Status: None (Preliminary result)   Collection Time: 05/01/24  7:44 AM   Specimen: BLOOD RIGHT FOREARM  Result Value Ref Range Status   Specimen Description BLOOD RIGHT FOREARM  Final   Special Requests   Final    BOTTLES DRAWN AEROBIC AND ANAEROBIC Blood Culture adequate volume   Culture   Final    NO GROWTH 4 DAYS Performed at [facility], [address], [place], [place] [address]    Report Status PENDING  Incomplete  Blood Culture (routine x 2)     Status: None (Preliminary result)   Collection Time: 05/01/24  7:44 AM   Specimen: Right Antecubital; Blood  Result Value Ref Range Status   Specimen Description RIGHT ANTECUBITAL  Final   Special Requests   Final    BOTTLES DRAWN AEROBIC AND ANAEROBIC Blood Culture  adequate volume   Culture   Final    NO GROWTH 4 DAYS Performed at [facility], [address] [place], [place] [address]    Report Status PENDING  Incomplete    IMAGING: CT HEAD WO CONTRAST ( ) Result Date: 05/04/2024 EXAM: CT HEAD WITHOUT 05/03/2024 07:12:24 PM TECHNIQUE: CT of the head was performed without the administration of intravenous contrast. Automated exposure control, iterative reconstruction, and/or weight based adjustment of the mA/kV was utilized to reduce the radiation dose to as low as reasonably achievable. COMPARISON: 05/01/2024 CLINICAL HISTORY: Mental status change, unknown cause FINDINGS: BRAIN AND VENTRICLES: No acute intracranial hemorrhage. No mass effect or midline shift. No extra-axial fluid collection. No evidence of acute infarct. No hydrocephalus. Chronic microvascular  ischemic disease. ORBITS: No acute abnormality. SINUSES AND MASTOIDS: No acute abnormality. SOFT TISSUES AND SKULL: No acute skull fracture. No acute soft tissue abnormality. IMPRESSION: 1. No acute intracranial abnormality. 2. Chronic microvascular ischemic disease Electronically signed by: Franky Stanford MD 05/04/2024 01:05 AM EST RP Workstation: [id number]   IR EXCHANGE BILIARY DRAIN Result Date: 05/03/2024 INDICATION: 68 year old male with a history of calculus cholecystitis and a chronic indwelling percutaneous cholecystostomy tube. He was recently admitted with acute fever and is undergoing workup for sepsis. Request is made for early cholecystostomy tube exchange. EXAM: Cholecystostomy tube exchange MEDICATIONS: None ANESTHESIA/SEDATION: None FLUOROSCOPY: Radiation Exposure Index (as provided by the fluoroscopic device): 3.4 mGy Kerma COMPLICATIONS: None immediate. PROCEDURE: Informed written consent was obtained from the patient after a thorough discussion of the procedural risks, benefits and alternatives. All questions were addressed. Maximal Sterile Barrier Technique was utilized  including caps, mask, sterile gowns, sterile gloves, sterile drape, hand hygiene and skin antiseptic. A timeout was performed prior to the initiation of the procedure. Contrast injection confirms the existing tube is patent and in good position. The gallbladder is contracted around the tube. This results in some of the injectate leaking out around the tube at the skin entry site. The retention suture was cut. The tube was transected and removed over an Amplatz wire. A new 12 French skater drain was advanced over the wire and formed. Contrast injection was performed confirming good position within the gallbladder lumen. The cystic duct remains occluded. IMPRESSION: Successful exchange for a new 52 French percutaneous cholecystostomy tube. Electronically Signed   By: Wilkie Lent M.D.   On: 05/03/2024 15:46   MR BRAIN WO CONTRAST Result Date: 05/01/2024 CLINICAL DATA:  Initial evaluation for acute neuro deficit, stroke suspected. EXAM: MRI HEAD WITHOUT CONTRAST TECHNIQUE: Multiplanar, multiecho pulse sequences of the brain and surrounding structures were obtained without intravenous contrast. COMPARISON:  CT from earlier the same day. FINDINGS: Brain: Cerebral volume within normal limits. Patchy and confluent T2/FLAIR hyperintensity involving the periventricular and deep white matter both cerebral hemispheres as well as the pons, consistent with chronic small vessel ischemic disease, moderate in nature. Few scatter remote lacunar infarcts present about the right hemispheric cerebral white matter, right basal ganglia, thalami, and pons. Few tiny remote cerebellar infarcts noted. No abnormal foci of restricted diffusion to suggest acute or subacute ischemia. Gray-white matter differentiation maintained. No areas of chronic cortical infarction. No acute intracranial hemorrhage. Single punctate chronic microhemorrhage noted within the right thalamus. No mass lesion, midline shift or mass effect. No hydrocephalus  or extra-axial fluid collection. Pituitary gland within normal limits. Vascular: Hypoplastic right vertebral artery not well seen. Major intracranial vascular flow voids are otherwise maintained. Skull and upper cervical spine: Craniocervical junction within normal limits. Mildly decreased T1 signal intensity within the visualized bone marrow, nonspecific, but most commonly related to anemia, smoking or obesity. No scalp soft tissue abnormality. Sinuses/Orbits: Globes orbital soft tissues within normal limits. Paranasal sinuses are largely clear. No significant mastoid effusion. Other: None. IMPRESSION: 1. No acute intracranial abnormality. 2. Moderate chronic microvascular ischemic disease with a few scattered remote lacunar infarcts involving the right hemispheric cerebral white matter, right basal ganglia, thalami, and pons. Electronically Signed   By: Morene Hoard M.D.   On: 05/01/2024 21:16   CT HEAD WO CONTRAST ( ) Result Date: 05/01/2024 EXAM: CT HEAD WITHOUT CONTRAST 05/01/2024 04:27:00 PM TECHNIQUE: CT of the head was performed without the administration of intravenous contrast. Automated exposure control, iterative reconstruction, and/or weight  based adjustment of the mA/kV was utilized to reduce the radiation dose to as low as reasonably achievable. COMPARISON: None available. CLINICAL HISTORY: 68 year old male. Neuro deficit, acute, stroke suspected. FINDINGS: BRAIN AND VENTRICLES: Normal brain volume for age. No acute hemorrhage. No hydrocephalus. No extra-axial collection. No mass effect or midline shift. Asymmetric moderate patchy and scattered bilateral cerebral white matter hypodensity. Age indeterminate hypodensity in the medial right thalamus on series 2 image 14. Similar small but circumscribed hypodensity in the right caudate nucleus appears to be a chronic lacunar infarct (coronal image 29). Moderate patchy hypodensity in the left paracentral pons (series 2 image 9). No suspicious  intracranial vascular hyperdensity. ORBITS: No acute abnormality. SINUSES: Paranasal sinuses, middle ears and mastoids are well aerated. SOFT TISSUES AND SKULL: Calcified scalp vessel atherosclerosis. No gaze deviation. Calcified atherosclerosis at the skull base. No acute soft tissue abnormality. No skull fracture. IMPRESSION: 1. Advanced chronic small vessel disease, including age indeterminate involvement of the right thalamus. Electronically signed by: Helayne Hurst MD 05/01/2024 04:49 PM EST RP Workstation: [id number]   CT ABDOMEN PELVIS W CONTRAST Result Date: 05/01/2024 EXAM: CT ABDOMEN AND PELVIS WITH CONTRAST 05/01/2024 08:12:07 AM TECHNIQUE: CT of the abdomen and pelvis was performed with the administration of 100 mL of iohexol  (OMNIPAQUE ) 300 MG/ML solution. Multiplanar reformatted images are provided for review. Automated exposure control, iterative reconstruction, and/or weight-based adjustment of the mA/kV was utilized to reduce the radiation dose to as low as reasonably achievable. COMPARISON: CT of the abdomen and pelvis dated 02/15/2024. CLINICAL HISTORY: Abdominal pain, acute, nonlocalized; perc chole drain, RUQ pain, purulent drainage, eval for infection. FINDINGS: LOWER CHEST: There is mild atelectasis present dependently within the lower lobes bilaterally. The patient is again noted to be status post aortic valve repair. LIVER: The liver is unremarkable. GALLBLADDER AND BILE DUCTS: A cholecystostomy tube remains in place. No biliary ductal dilatation. SPLEEN: No acute abnormality. PANCREAS: No acute abnormality. ADRENAL GLANDS: No acute abnormality. KIDNEYS, URETERS AND BLADDER: There is moderate right hydroureteronephrosis. No stones in the kidneys or ureters. No perinephric or periureteral stranding. Urinary bladder is unremarkable. GI AND BOWEL: Stomach demonstrates no acute abnormality. There is no bowel obstruction. There are few scattered colonic diverticula. The patient is status post  appendectomy. PERITONEUM AND RETROPERITONEUM: No ascites. No free air. VASCULATURE: The abdominal aorta is normal in caliber and demonstrates moderate calcific atheromatous disease. LYMPH NODES: No lymphadenopathy. REPRODUCTIVE ORGANS: No acute abnormality. BONES AND SOFT TISSUES: There is bilateral spondylolysis and grade 1 anterolisthesis at L5-S1. No focal soft tissue abnormality. IMPRESSION: 1. Moderate right hydroureteronephrosis without stones or perinephric/periureteral stranding. Correlate clinically for obstructive etiology; consider urologic consultation if clinically indicated. 2. Cholecystostomy tube in place. No biliary ductal dilatation. Electronically signed by: Evalene Coho MD 05/01/2024 08:26 AM EST RP Workstation: [id number]   DG Chest Port 1 View Result Date: 05/01/2024 CLINICAL DATA:  Sepsis. EXAM: PORTABLE CHEST 1 VIEW COMPARISON:  11/11/2023 FINDINGS: The heart size and mediastinal contours are within normal limits. Prior CABG and TAVR noted. Right jugular dual-lumen central venous dialysis catheter remains in appropriate position. Both lungs are clear. IMPRESSION: No active disease. Electronically Signed   By: Norleen DELENA Kil M.D.   On: 05/01/2024 07:52    Assessment:   Phillip [name] is a 68 y.o. male with a complicated last 6 months with multiple hospitalizations including for acute BKA for ischemic limb, cholecystitis status postcholecystectomy tube, TAVR placement 01/2024.   patient was admitted November 29 with right-sided abdominal  pain and fever.  Defervesced. Initially. CT abd pelvis neg, cxr neg, ct head neg. Initial bcx neg.  On broad spectrum abx but had recurrent fever yesterday but no bcx done Multiple possible sources include biliary infection, R hydronephrosis (does not make urine so cannot do UA and no enhancement on CT but may need to reassess if fever persists), line infection, stump wound (but photo on admit seems good)  Recommendations Cont current abx CTX,  flagyl  and vanco  Repeat bcx today If has recurrent fever repeat bcx IF AVF is functional consider remove HD cath  Mod R hydruureteronephrosis - no evidence infection on CT and does not make urine  BKA stump site- no evidence infection - has wound vac  Thank you very much for allowing me to participate in the care of this patient. Please call with questions.   Alm SQUIBB. Epifanio, MD

## 2024-05-05 NOTE — TOC Progression Note (Addendum)
 Transition of Care [facility]) - Progression Note    Patient Details  Name: Phillip [name] MRN: [id number] Date of Birth: 06-03-1956  Transition of Care [facility]) CM/SW Contact  Shasta DELENA Daring, RN Phone Number: 05/05/2024, 1:22 PM  Clinical Narrative:     RNCM discussed PT and OT recommendations with patient. Patient declines SNF placement. Is amenable to Home Health services. Already established with [facility]. Left message for [facility] rep to confirm what services are being provided. Will follow.     2:56 PM Confirmed with [facility] rep that patient has PT, OT and RN services. When he is discharged, RN services will likely have to move to Friday. Will notify them when D/C is confirmed.               Expected Discharge Plan and Services                                               Social Drivers of Health (SDOH) Interventions SDOH Screenings   Food Insecurity: No Food Insecurity (05/02/2024)  Housing: Low Risk  (05/02/2024)  Transportation Needs: No Transportation Needs (05/02/2024)  Utilities: Not At Risk (05/02/2024)  Depression (PHQ2-9): Low Risk  (10/22/2018)  Financial Resource Strain: Low Risk (09/26/2020)   Received from [facility]  Physical Activity: Inactive (09/26/2020)   Received from [facility]  Social Connections: Moderately Integrated (05/02/2024)  Stress: No Stress Concern Present (09/26/2020)   Received from [facility]  Tobacco Use: Medium Risk (05/01/2024)  Health Literacy: Low Risk (09/26/2020)   Received from [facility]    Readmission Risk Interventions    02/05/2024    1:08 PM 08/13/2023    4:28 PM  Readmission Risk Prevention Plan  Transportation Screening Complete Complete  HRI or Home Care Consult  Complete  Palliative Care Screening  Not Applicable  Medication Review (RN Care Manager) Complete Complete  HRI or Home Care Consult Complete   SW Recovery Care/Counseling Consult Complete   Palliative Care Screening Not  Applicable   Skilled Nursing Facility Not Applicable

## 2024-05-05 NOTE — Care Management Important Message (Signed)
 Important Message  Patient Details  Name: Phillip [name] MRN: [id number] Date of Birth: 12/06/1955   Important Message Given:  Yes - Medicare IM     Marnae Madani W, CMA 05/05/2024, 12:40 PM

## 2024-05-05 NOTE — Telephone Encounter (Signed)
 Copied from CRM #[id number]. Topic: Clinical Concerns - Medical Question >> May 05, 2024  2:18 PM Sharlet DEL wrote: Deanna/[facility] is calling other request   Include all details related to the request(s) below:  Deanna would like to know the status of order number [id number] of resumption of care on behalf of patient.  She will re-fax today.  Confirm and type the Best Contact Number below:  Patient/caller contact number:      [phone]    [] Home  [] Mobile  [] Work [x] Other   [x] Okay to leave a voicemail   Medication List:  Current Outpatient Medications:  .  acetaminophen  (TYLENOL ) 325 mg tablet, Take 650 mg by mouth every 6 (six) hours as needed for mild pain (1-3)., Disp: , Rfl:  .  aspirin  (Ecotrin Low Strength) 81 mg EC tablet, Take 81 mg by mouth daily., Disp: , Rfl:  .  atorvastatin  (LIPITOR) 20 mg tablet, Take 1 tablet (20 mg total) by mouth every morning., Disp: 30 tablet, Rfl: 11 .  blood-glucose meter misc, Use three times a day, Disp: 1 each, Rfl: 0 .  carvediloL  (COREG ) 3.125 mg tablet, Take 1 tablet (3.125 mg total) by mouth in the morning and 1 tablet (3.125 mg total) in the evening. Take with meals., Disp: 60 tablet, Rfl: 11 .  dorzolamide (TRUSOPT) 2 % ophthalmic solution, 1 drop 3 (three) times a day., Disp: , Rfl:  .  ergocalciferol  (VITAMIN D2) 1,250 mcg (50,000 unit) capsule, Take 1 capsule by mouth once a week, Disp: 12 capsule, Rfl: 1 .  gabapentin  (NEURONTIN ) 600 mg tablet, Take 1 tablet (600 mg total) by mouth 2 (two) times a day., Disp: 60 tablet, Rfl: 11 .  glucose blood (OneTouch Verio test strips) test strip, Use three times a day, Disp: 100 each, Rfl: 3 .  Lancets misc, 1 each by miscellaneous route 2 (two) times a day., Disp: 100 each, Rfl: 1 .  latanoprost  (XALATAN ) 0.005 % ophthalmic solution, Administer 1 drop into each eyes at bedtime., Disp: , Rfl:  .  levothyroxine  (SYNTHROID ) 50 mcg tablet, Take 1 tablet (50 mcg total) by mouth daily., Disp: 30 tablet,  Rfl: 11 .  nitroglycerin  (NITROSTAT ) 0.4 mg SL tablet, Place 0.4 mg under the tongue every 5 (five) minutes as needed for chest pain., Disp: 25 tablet, Rfl: 1 .  oxyCODONE  (ROXICODONE ) 5 mg immediate release tablet, Take 1 tablet (5 mg total) by mouth every 12 (twelve) hours as needed for moderate pain (4-6) or severe pain (7-10)., Disp: 60 tablet, Rfl: 0 .  sevelamer  carbonate (RENVELA ) 800 mg tablet, Take 1 tablet by mouth in the morning and 1 tablet at noon and 1 tablet in the evening. Take with meals., Disp: , Rfl:      Medication Request/Refills: Pharmacy Information (if applicable)   [x] Not Applicable       []  Pharmacy listed  Send Medication Request to:                                                 [] Pharmacy not listed (added to pharmacy list in Epic) Send Medication Request to:      Listed Pharmacies: [facility] [address] [place], [place] - [address] [address] - PHONE: [phone] - FAX: [phone]

## 2024-05-06 DIAGNOSIS — I5022 Chronic systolic (congestive) heart failure: Secondary | ICD-10-CM | POA: Insufficient documentation

## 2024-05-06 DIAGNOSIS — Z9889 Other specified postprocedural states: Secondary | ICD-10-CM

## 2024-05-06 DIAGNOSIS — R6510 Systemic inflammatory response syndrome (SIRS) of non-infectious origin without acute organ dysfunction: Secondary | ICD-10-CM | POA: Diagnosis not present

## 2024-05-06 LAB — CBC
HCT: 23.9 % — ABNORMAL LOW (ref 39.0–52.0)
Hemoglobin: 7.4 g/dL — ABNORMAL LOW (ref 13.0–17.0)
MCH: 29.8 pg (ref 26.0–34.0)
MCHC: 31 g/dL (ref 30.0–36.0)
MCV: 96.4 fL (ref 80.0–100.0)
Platelets: 85 K/uL — ABNORMAL LOW (ref 150–400)
RBC: 2.48 MIL/uL — ABNORMAL LOW (ref 4.22–5.81)
RDW: 15 % (ref 11.5–15.5)
WBC: 8.3 K/uL (ref 4.0–10.5)
nRBC: 0 % (ref 0.0–0.2)

## 2024-05-06 LAB — BASIC METABOLIC PANEL WITH GFR
Anion gap: 16 — ABNORMAL HIGH (ref 5–15)
BUN: 48 mg/dL — ABNORMAL HIGH (ref 8–23)
CO2: 23 mmol/L (ref 22–32)
Calcium: 8.3 mg/dL — ABNORMAL LOW (ref 8.9–10.3)
Chloride: 96 mmol/L — ABNORMAL LOW (ref 98–111)
Creatinine, Ser: 6.76 mg/dL — ABNORMAL HIGH (ref 0.61–1.24)
GFR, Estimated: 8 mL/min — ABNORMAL LOW (ref >60)
Glucose, Bld: 80 mg/dL (ref 70–99)
Potassium: 3.9 mmol/L (ref 3.5–5.1)
Sodium: 134 mmol/L — ABNORMAL LOW (ref 135–145)

## 2024-05-06 LAB — PROCALCITONIN: Procalcitonin: 100 ng/mL

## 2024-05-06 SURGERY — DIALYSIS/PERMA CATHETER REMOVAL
Anesthesia: LOCAL

## 2024-05-06 MED ORDER — EPOETIN ALFA-EPBX 10000 UNIT/ML IJ SOLN
10000.0000 [IU] | INTRAMUSCULAR | Status: DC
  Administered 2024-05-06: 10000 [IU] via INTRAVENOUS

## 2024-05-06 MED ORDER — OXYCODONE HCL 5 MG PO TABS
ORAL_TABLET | ORAL | Status: AC
  Filled 2024-05-06: qty 2

## 2024-05-06 MED ORDER — EPOETIN ALFA-EPBX 10000 UNIT/ML IJ SOLN
INTRAMUSCULAR | Status: AC
  Filled 2024-05-06: qty 1

## 2024-05-07 LAB — CULTURE, BLOOD (ROUTINE X 2)
Culture: NO GROWTH
Culture: NO GROWTH
Special Requests: ADEQUATE
Special Requests: ADEQUATE

## 2024-05-10 LAB — CULTURE, BLOOD (ROUTINE X 2)
Culture: NO GROWTH
Culture: NO GROWTH
Special Requests: ADEQUATE
Special Requests: ADEQUATE

## 2024-06-03 NOTE — Progress Notes (Signed)
 Progress Note    Phillip [name]  FMW:[id number] DOB: 12/23/1955  DOA: 05/01/2024 PCP: [name] LABOR., MD      Brief Narrative:    Medical records reviewed and are as summarized below:  Phillip [name] is a 69 y.o. male with history of end-stage renal disease on hemodialysis, history of severe aortic stenosis status post TAVR, hyperlipidemia, neuropathy, hypothyroid, recent acute cholecystitis status post percutaneous cholecystostomy tube placement, PAD status post PCI, BKA of the left leg, who presented to the hospital because of right-sided abdominal pain.       Vitals in the ED showed Tmax of 102.7, rr 23, hr 126, blood pressure 132/85, SpO2 100% on room air.     ED treatment: He was started on empiric IV antibiotics and IV fluids.        Assessment/Plan:   Principal Problem:   SIRS (systemic inflammatory response syndrome) (HCC) Active Problems:   Right sided weakness   CAD (coronary artery disease)   Hypothyroidism (acquired)   Essential hypertension   ESRD on dialysis [facility])   Atherosclerosis of native arteries of the extremities with ulceration (HCC)   Diabetes mellitus without complication (HCC)   Hyperlipidemia associated with type 2 diabetes mellitus (HCC)   Pulmonary HTN (HCC)   S/P TAVR (transcatheter aortic valve replacement)   Protein-calorie malnutrition, severe   S/P BKA (below knee amputation) unilateral, left (HCC)   Elevated troponin   H/O removal of dialysis catheter    Body mass index is 22.37 kg/m.   SIRS, recurrent fever, probable sepsis: Initial concern for intra-abdominal infection because of presence of cholecystostomy tube. S/p exchange of percutaneous cholecystostomy tube on 05/03/2024. Patient still had recurrent fever after cholecystostomy exchange. Plan to remove right IJ PermCath by vascular surgeon. Continue empiric IV antibiotics. Procalcitonin was 100 on 05/02/2024.  Repeat procalcitonin was 100 on  . Follow-up with ID specialist.     ESRD on HD, anemia of chronic disease: Rapid response was called for fever during dialysis on 05/01/2024.  He had a recurrent fever on 05/04/2024 at hemodialysis. Patient had hemodialysis using left arm AV fistula without any problems today. Vascular surgeon has been consulted for removal of right IJ PermCath.      Right-sided weakness: Improved.  No acute stroke on CT head.  Continue PT.     S/p left BKA: Wound VAC in place     Comorbidities include hypertension, CAD s/p CABG on aspirin  and Plavix , hypothyroidism, hyperlipidemia, diabetes mellitus, history of aortic stenosis s/p TAVR      Diet Order             Diet renal with fluid restriction Fluid restriction: 1200 mL Fluid; Room service appropriate? Yes; Fluid consistency: Thin  Diet effective now                                  Consultants: Nephrologist ID specialist Vascular surgeon  Procedures: S/p exchange of percutaneous cholecystostomy tube on 05/03/2024 Plan to remove right IJ PermCath    Medications:    aspirin  EC  81 mg Oral Daily   atorvastatin   20 mg Oral Daily   B-complex with vitamin C  1 tablet Oral Daily   Chlorhexidine  Gluconate Cloth  6 each Topical Q0600   clopidogrel   75 mg Oral Daily   epoetin alfa-epbx (RETACRIT) injection  10,000 Units Intravenous Q T,Th,Sat-1800   gabapentin   600 mg Oral BID  heparin   5,000 Units Subcutaneous Q8H   isosorbide  mononitrate  120 mg Oral Daily   levothyroxine   50 mcg Oral Q0600   sevelamer  carbonate  800 mg Oral TID WC   tamsulosin   0.4 mg Oral Daily   Continuous Infusions:  cefTRIAXone  (ROCEPHIN )  IV 2 g (05/05/24 1511)   metronidazole  500 mg ( 0418)   vancomycin        Anti-infectives (From admission, onward)    Start     Dose/Rate Route Frequency Ordered Stop    1800  vancomycin  (VANCOREADY) IVPB 750 mg/150 mL  Status:  Discontinued        750 mg 150 mL/hr over  60 Minutes Intravenous Every T-Th-Sa (1800) 05/04/24 1629 05/04/24 1649    1800  vancomycin  (VANCOREADY) IVPB 750 mg/150 mL        750 mg 150 mL/hr over 60 Minutes Intravenous Every T-Th-Sa (1800) 05/04/24 1914     05/04/24 2000  vancomycin  (VANCOREADY) IVPB 1750 mg/350 mL        1,750 mg 175 mL/hr over 120 Minutes Intravenous  Once 05/04/24 1914 05/04/24 2200   05/04/24 1800  vancomycin  (VANCOREADY) IVPB 2000 mg/400 mL  Status:  Discontinued        2,000 mg 200 mL/hr over 120 Minutes Intravenous  Once 05/04/24 1629 05/04/24 1648   05/02/24 1530  cefTRIAXone  (ROCEPHIN ) 2 g in sodium chloride  0.9 % 100 mL IVPB        2 g 200 mL/hr over 30 Minutes Intravenous Every 24 hours 05/01/24 1536 05/09/24 1529   05/01/24 1545  metroNIDAZOLE  (FLAGYL ) IVPB 500 mg        500 mg 100 mL/hr over 60 Minutes Intravenous Every 12 hours 05/01/24 1536 05/08/24 1544   05/01/24 1515  ceFEPIme  (MAXIPIME ) 2 g in sodium chloride  0.9 % 100 mL IVPB        2 g 200 mL/hr over 30 Minutes Intravenous  Once 05/01/24 1503 05/01/24 1700   05/01/24 1515  metroNIDAZOLE  (FLAGYL ) IVPB 500 mg  Status:  Discontinued        500 mg 100 mL/hr over 60 Minutes Intravenous  Once 05/01/24 1503 05/01/24 1541   05/01/24 1515  vancomycin  (VANCOCIN ) IVPB 1000 mg/200 mL premix        1,000 mg 200 mL/hr over 60 Minutes Intravenous  Once 05/01/24 1503 05/01/24 2038              Family Communication/Anticipated D/C date and plan/Code Status   DVT prophylaxis: heparin  injection 5,000 Units Start: 05/01/24 2200 Place TED hose Start: 05/01/24 1537     Code Status: Full Code  Family Communication: None Disposition Plan: Plan to discharge home   Status is: Inpatient Remains inpatient appropriate because: Recurrent fever       Subjective:   Interval events noted.  He has no complaints.  No shortness of breath, chest pain or fever.  He just came back from dialysis.  Dialysis was uneventful.  Left arm AV fistula  was successfully used for hemodialysis.  Objective:    Vitals:    1245  1247  1300  1306  BP: 115/67 (!) 114/92 (!) 92/56   Pulse: 77 76  78  Resp: 15 16  19   Temp:  97.9 F (36.6 C)  97.8 F (36.6 C)  TempSrc:      SpO2: 98% 100%  98%  Weight:    74.8 kg  Height:       No data found.   Intake/Output Summary (Last  24 hours) at  1356 Last data filed at  1306 Gross per 24 hour  Intake 120 ml  Output 1600 ml  Net -1480 ml   Filed Weights   05/04/24 1600  0855  1306  Weight: 73.4 kg 76.4 kg 74.8 kg    Exam:  GEN: NAD SKIN: No rash.  PermCath in right upper chest EYES: No pallor or icterus ENT: MMM CV: RRR PULM: CTA B ABD: soft, ND, NT, +BS CNS: AAO x 4 EXT: Mild right leg edema.  Left BKA.        Data Reviewed:   I have personally reviewed following labs and imaging studies:  Labs: Labs show the following:   Basic Metabolic Panel: Recent Labs  Lab 05/01/24 0731 05/02/24 0550 05/04/24 0535  0522  NA 142 138 137 134*  K 3.8 3.9 4.6 3.9  CL 100 100 97* 96*  CO2 30 25 22 23   GLUCOSE 145* 125* 108* 80  BUN 32* 33* 57* 48*  CREATININE 4.87* 5.31* 7.99* 6.76*  CALCIUM  8.8* 8.3* 8.8* 8.3*   GFR Estimated Creatinine Clearance: 11.1 mL/min (A) (by C-G formula based on SCr of 6.76 mg/dL (H)). Liver Function Tests: Recent Labs  Lab 05/01/24 0731  AST 39  ALT 19  ALKPHOS 238*  BILITOT 0.7  PROT 7.6  ALBUMIN  3.4*   Recent Labs  Lab 05/01/24 0731  LIPASE 41   No results for input(s): AMMONIA in the last 168 hours. Coagulation profile Recent Labs  Lab 05/01/24 0731  INR 1.1    CBC: Recent Labs  Lab 05/01/24 0731 05/02/24 0550 05/04/24 0535  0522  WBC 8.8 14.0* 9.6 8.3  NEUTROABS 7.3  --  8.2*  --   HGB 9.0* 8.5* 8.9* 7.4*  HCT 29.1* 27.7* 29.0* 23.9*  MCV 99.3 101.1* 98.3 96.4  PLT 126* 95* 129* 85*   Cardiac Enzymes: No results for  input(s): CKTOTAL, CKMB, CKMBINDEX, TROPONINI in the last 168 hours. BNP (last 3 results) Recent Labs    07/14/23 1412  PROBNP 11,269*   CBG: Recent Labs  Lab 05/04/24 1713  GLUCAP 77   D-Dimer: No results for input(s): DDIMER in the last 72 hours. Hgb A1c: No results for input(s): HGBA1C in the last 72 hours. Lipid Profile: No results for input(s): CHOL, HDL, LDLCALC, TRIG, CHOLHDL, LDLDIRECT in the last 72 hours. Thyroid  function studies: No results for input(s): TSH, T4TOTAL, T3FREE, THYROIDAB in the last 72 hours.  Invalid input(s): FREET3 Anemia work up: No results for input(s): VITAMINB12, FOLATE, FERRITIN, TIBC, IRON , RETICCTPCT in the last 72 hours. Sepsis Labs: Recent Labs  Lab 05/01/24 0731 05/01/24 1510 05/01/24 1738 05/02/24 0550 05/02/24 0905 05/04/24 0535  0522  PROCALCITON  --   --   --   --  100.00  --  100.00  WBC 8.8  --   --  14.0*  --  9.6 8.3  LATICACIDVEN 1.4 3.1* 2.3*  --   --   --   --     Microbiology Recent Results (from the past 240 hours)  Blood Culture (routine x 2)     Status: None (Preliminary result)   Collection Time: 05/01/24  7:44 AM   Specimen: BLOOD RIGHT FOREARM  Result Value Ref Range Status   Specimen Description BLOOD RIGHT FOREARM  Final   Special Requests   Final    BOTTLES DRAWN AEROBIC AND ANAEROBIC Blood Culture adequate volume   Culture   Final    NO GROWTH 4 DAYS  Performed at [facility], [address] [place], [place] [address]    Report Status PENDING  Incomplete  Blood Culture (routine x 2)     Status: None (Preliminary result)   Collection Time: 05/01/24  7:44 AM   Specimen: Right Antecubital; Blood  Result Value Ref Range Status   Specimen Description RIGHT ANTECUBITAL  Final   Special Requests   Final    BOTTLES DRAWN AEROBIC AND ANAEROBIC Blood Culture adequate volume   Culture   Final    NO GROWTH 4 DAYS Performed at [facility], [address], [place], [place] [address]    Report Status PENDING  Incomplete    Procedures and diagnostic studies:  No results found.             LOS: 5 days   Tyjae Issa  [facility]   Pager on [email or web address]. If 7PM-7AM, please contact night-coverage at [email or web address]     , 1:56 PM

## 2024-06-03 NOTE — Progress Notes (Signed)
 Code blue was called overhead.  When I arrived at the bedside, CPR was already in progress.  Initial rhythm on telemetry was asystole.  Patient was successfully intubated by ED physician, Dr. Nicholaus.  CPR was done per protocol. Multiple rhythm checks during CPR revealed asystole at all times.  There was no V. tach or V-fib or any shockable rhythm.  Patient  received 8 rounds of IV epinephrine  and 1 amp of IV sodium bicarbonate  during CPR.  After 25 minutes of CPR, CPR was terminated.  He was unresponsive, there was no palpable pulse or recordable blood pressure or precordial activity on auscultation.  Pupils were round, fixed and dilated.  He was pronounced at 4:01 PM.  I called his wife to inform her about her husband's death.  Emotional support was provided.  She said she was going to come to the hospital to see him but she wanted to wait for somebody to drive her to the hospital.  I encouraged her to do so.

## 2024-06-03 NOTE — Progress Notes (Signed)
 [facility]  ROUNDING NOTE   Subjective:   Patient seen and evaluated during dialysis   HEMODIALYSIS FLOWSHEET:  Blood Flow Rate (mL/min): 349 mL/min Arterial Pressure (mmHg): -224.43 mmHg Venous Pressure (mmHg): 286.45 mmHg TMP (mmHg): 17.77 mmHg Ultrafiltration Rate (mL/min): 740 mL/min Dialysate Flow Rate (mL/min): 299 ml/min  Awake and pleasant  Objective:  Vital signs in last 24 hours:  Temp:  [98.1 F (36.7 C)-99.4 F (37.4 C)] 98.1 F (36.7 C) 05-20-24 0855) Pulse Rate:  [65-85] 76 May 20, 2024 1200) Resp:  [14-18] 14 2024/05/20 1200) BP: (102-121)/(56-68) 121/68 20-May-2024 1200) SpO2:  [96 %-100 %] 100 % 20-May-2024 1200) Weight:  [76.4 kg] 76.4 kg 05-20-2024 0855)  Weight change:  Filed Weights   05/04/24 1219 05/04/24 1600 05-20-24 0855  Weight: 74.9 kg 73.4 kg 76.4 kg    Intake/Output: I/O last 3 completed shifts: In: 1410 [P.O.:960; IV Piggyback:450] Out: 25 [Drains:25]   Intake/Output this shift:  No intake/output data recorded.  Physical Exam: General: Nad  Head: Normocephalic, atraumatic. Moist oral mucosal membranes  Eyes: Anicteric  Lungs:  Clear to auscultation, normal effort  Heart: Regular rate and rhythm  Abdomen:  Soft, nontender  Extremities:  No peripheral edema. Lt BKA  Neurologic: Awake, alert, conversant  Skin: Warm,dry, no rash. NPWV-Left lateral BKA  Access: Rt internal jugular permcath    Basic Metabolic Panel: Recent Labs  Lab 05/01/24 0731 05/02/24 0550 05/04/24 0535 May 20, 2024 0522  NA 142 138 137 134*  K 3.8 3.9 4.6 3.9  CL 100 100 97* 96*  CO2 30 25 22 23   GLUCOSE 145* 125* 108* 80  BUN 32* 33* 57* 48*  CREATININE 4.87* 5.31* 7.99* 6.76*  CALCIUM  8.8* 8.3* 8.8* 8.3*    Liver Function Tests: Recent Labs  Lab 05/01/24 0731  AST 39  ALT 19  ALKPHOS 238*  BILITOT 0.7  PROT 7.6  ALBUMIN  3.4*   Recent Labs  Lab 05/01/24 0731  LIPASE 41   No results for input(s): AMMONIA in the last 168 hours.  CBC: Recent  Labs  Lab 05/01/24 0731 05/02/24 0550 05/04/24 0535 05/20/2024 0522  WBC 8.8 14.0* 9.6 8.3  NEUTROABS 7.3  --  8.2*  --   HGB 9.0* 8.5* 8.9* 7.4*  HCT 29.1* 27.7* 29.0* 23.9*  MCV 99.3 101.1* 98.3 96.4  PLT 126* 95* 129* 85*    Cardiac Enzymes: No results for input(s): CKTOTAL, CKMB, CKMBINDEX, TROPONINI in the last 168 hours.  BNP: Invalid input(s): POCBNP  CBG: Recent Labs  Lab 05/04/24 1713  GLUCAP 77    Microbiology: Results for orders placed or performed during the hospital encounter of 05/01/24  Blood Culture (routine x 2)     Status: None (Preliminary result)   Collection Time: 05/01/24  7:44 AM   Specimen: BLOOD RIGHT FOREARM  Result Value Ref Range Status   Specimen Description BLOOD RIGHT FOREARM  Final   Special Requests   Final    BOTTLES DRAWN AEROBIC AND ANAEROBIC Blood Culture adequate volume   Culture   Final    NO GROWTH 4 DAYS Performed at [facility], [address] [place], [place] [address]    Report Status PENDING  Incomplete  Blood Culture (routine x 2)     Status: None (Preliminary result)   Collection Time: 05/01/24  7:44 AM   Specimen: Right Antecubital; Blood  Result Value Ref Range Status   Specimen Description RIGHT ANTECUBITAL  Final   Special Requests   Final    BOTTLES  DRAWN AEROBIC AND ANAEROBIC Blood Culture adequate volume   Culture   Final    NO GROWTH 4 DAYS Performed at [facility], [address] [place], [place] [address]    Report Status PENDING  Incomplete    Coagulation Studies: No results for input(s): LABPROT, INR in the last 72 hours.   Urinalysis: No results for input(s): COLORURINE, LABSPEC, PHURINE, GLUCOSEU, HGBUR, BILIRUBINUR, KETONESUR, PROTEINUR, UROBILINOGEN, NITRITE, LEUKOCYTESUR in the last 72 hours.  Invalid input(s): APPERANCEUR    Imaging: No results found.    Medications:    cefTRIAXone  (ROCEPHIN )  IV 2 g (05/05/24 1511)    metronidazole  500 mg ( 0418)   vancomycin       aspirin  EC  81 mg Oral Daily   atorvastatin   20 mg Oral Daily   B-complex with vitamin C  1 tablet Oral Daily   Chlorhexidine  Gluconate Cloth  6 each Topical Q0600   clopidogrel   75 mg Oral Daily   gabapentin   600 mg Oral BID   heparin   5,000 Units Subcutaneous Q8H   isosorbide  mononitrate  120 mg Oral Daily   levothyroxine   50 mcg Oral Q0600   sevelamer  carbonate  800 mg Oral TID WC   tamsulosin   0.4 mg Oral Daily   acetaminophen  **OR** acetaminophen , hydrALAZINE , iohexol , ondansetron  **OR** ondansetron  (ZOFRAN ) IV, mouth rinse, oxyCODONE , senna-docusate  Assessment/ Plan:  [name] is a 69 y.o.  male with past medical history of hyperlipidemia, neuropathy, recent acute cholecystitis with percutaneous tube insertion, PAD with PCI, and end-stage renal disease on hemodialysis, who was admitted to [facility] on 05/01/2024 for SIRS (systemic inflammatory response syndrome) (HCC) [R65.10] Right upper quadrant abdominal pain [R10.11] Hydronephrosis, unspecified hydronephrosis type [N13.30]   End stage renal disease on hemodialysis.[name] using fistula. Decreased blood flow due to arterial pressures and positional. Will place order for permcath removal. Next treatment scheduled for Saturday   Fever, unknown origin. Associated symptoms of tachycardia and chills. Septic workup in progress. Blood cultures remain negative. Permcath placed 2 weeks ago, per patient. Unlikely infection source.  IR performed chole drain exchange on 12/1. ID consulted and recommend continued antibiotics. Will request permcath removal from vascular.  3. Anemia of chronic kidney disease Lab Results  Component Value Date   HGB 7.4 (L)     Hgb has decreased. Will order Retacrit 10000 units with dialysis.   4. Secondary Hyperparathyroidism: with outpatient labs: none available Lab Results  Component Value Date   PTH 332 (H)  08/13/2023   CALCIUM  8.3 (L)    CAION 0.99 (L) 04/12/2024   PHOS 4.3 04/15/2024    Will continue to monitor bone minerals during this admission.    LOS: 5 Phillip [name] 12/4/202512:28 PM

## 2024-06-03 NOTE — Death Summary Note (Signed)
 DEATH SUMMARY   Patient Details  Name: Phillip [name] MRN: [id number] DOB: [birth year] ERE:Hmpddn, Cathlyn LABOR., MD Admission/Discharge Information   Admit Date:  May 21, 2024  Date of Death: Date of Death: 05-26-24  Time of Death: Time of Death: 1401  Length of Stay: 5   Principle Cause of death: CAD, CHF  Hospital Diagnoses: Principal Problem:   SIRS (systemic inflammatory response syndrome) (HCC) Active Problems:   Right sided weakness   CAD (coronary artery disease)   Hypothyroidism (acquired)   Essential hypertension   ESRD on dialysis [facility])   Atherosclerosis of native arteries of the extremities with ulceration (HCC)   Diabetes mellitus without complication (HCC)   Hyperlipidemia associated with type 2 diabetes mellitus (HCC)   Pulmonary HTN (HCC)   S/P TAVR (transcatheter aortic valve replacement)   Protein-calorie malnutrition, severe   S/P BKA (below knee amputation) unilateral, left (HCC)   Elevated troponin   H/O removal of dialysis catheter   Chronic heart failure with reduced ejection fraction (HFrEF, <= 40%) [facility])   Hospital Course:  Phillip [name] is a 69 y.o. male with history of end-stage renal disease on hemodialysis, history of severe aortic stenosis status post TAVR, cronic HFreF, hyperlipidemia, neuropathy, hypothyroid, recent acute cholecystitis status post percutaneous cholecystostomy tube placement, PAD status post PCI, BKA of the left leg, who presented to the hospital because of right-sided abdominal pain.       Vitals in the ED showed Tmax of 102.7, rr 23, hr 126, blood pressure 132/85, SpO2 100% on room air.     ED treatment: He was started on empiric IV antibiotics and IV fluids.    Assessment and Plan:   SIRS, recurrent fever, probable sepsis: Initial concern for intra-abdominal infection because of presence of cholecystostomy tube. S/p exchange of percutaneous cholecystostomy tube on 05/03/2024. Patient still had recurrent fever after  cholecystostomy exchange. He was treated with empiric IV antibiotics. ID specialist was consulted to assist with management. Vascular surgeon was consulted to remove right IJ permacath.  Procalcitonin was 100 on 05/02/2024.  Repeat procalcitonin was 100 on May 26, 2024.      ESRD on HD, anemia of chronic disease: Rapid response was called for fever during dialysis on May 21, 2024.  He had a recurrent fever on 05/04/2024 at hemodialysis. Patient had hemodialysis using left arm AV fistula without any problems on May 26, 2024 Vascular surgeon had scheduled removal of right IJ permacath.       Right-sided weakness: Improved.  No acute stroke on CT head.  Continue PT.     S/p left BKA: Wound VAC in place     Comorbidities include chronic HFrEF with EF of 25 to 30%, hypertension, CAD s/p CABG on aspirin  and Plavix , hypothyroidism, hyperlipidemia, diabetes mellitus, history of aortic stenosis s/p TAVR     About 3 hours after coming back to his room from the dialysis unit, patient was found unresponsive by the nursing staff.  Cardiac arrest code was paged overhead.  He was intubated and CPR was performed for about 25 minutes.  He received about 8 rounds of IV epinephrine  and 1 amp of IV sodium bicarbonate .  Unfortunately, ROSC was not achieved and he was pronounced dead at 4:01 PM.  His wife was notified about his death.  Emotional support was provided.  Dr. Marcelino, nephrologist and Dr. Jama, vascular surgeon, were notified as well.   CRITICAL CARE Performed by: AIDA CHO   Total critical care time: 40 minutes  Critical care time was exclusive of separately  billable procedures and treating other patients.  Critical care was necessary to treat or prevent imminent or life-threatening deterioration.  Critical care was time spent personally by me on the following activities: development of treatment plan with patient and/or surrogate as well as nursing, discussions with consultants, evaluation  of patient's response to treatment, examination of patient, obtaining history from patient or surrogate, ordering and performing treatments and interventions, ordering and review of laboratory studies, ordering and review of radiographic studies, pulse oximetry and re-evaluation of patient's condition.        Procedures: S/p exchange of percutaneous cholecystostomy tube on 05/03/2024   Consultations: Nephrologist, ID specialist, vascular surgeon  The results of significant diagnostics from this hospitalization (including imaging, microbiology, ancillary and laboratory) are listed below for reference.   Significant Diagnostic Studies: CT HEAD WO CONTRAST ( ) Result Date: 05/04/2024 EXAM: CT HEAD WITHOUT 05/03/2024 07:12:24 PM TECHNIQUE: CT of the head was performed without the administration of intravenous contrast. Automated exposure control, iterative reconstruction, and/or weight based adjustment of the mA/kV was utilized to reduce the radiation dose to as low as reasonably achievable. COMPARISON: 05/01/2024 CLINICAL HISTORY: Mental status change, unknown cause FINDINGS: BRAIN AND VENTRICLES: No acute intracranial hemorrhage. No mass effect or midline shift. No extra-axial fluid collection. No evidence of acute infarct. No hydrocephalus. Chronic microvascular ischemic disease. ORBITS: No acute abnormality. SINUSES AND MASTOIDS: No acute abnormality. SOFT TISSUES AND SKULL: No acute skull fracture. No acute soft tissue abnormality. IMPRESSION: 1. No acute intracranial abnormality. 2. Chronic microvascular ischemic disease Electronically signed by: Franky Stanford MD 05/04/2024 01:05 AM EST RP Workstation: [id number]   IR EXCHANGE BILIARY DRAIN Result Date: 05/03/2024 INDICATION: 69 year old male with a history of calculus cholecystitis and a chronic indwelling percutaneous cholecystostomy tube. He was recently admitted with acute fever and is undergoing workup for sepsis. Request is made for early  cholecystostomy tube exchange. EXAM: Cholecystostomy tube exchange MEDICATIONS: None ANESTHESIA/SEDATION: None FLUOROSCOPY: Radiation Exposure Index (as provided by the fluoroscopic device): 3.4 mGy Kerma COMPLICATIONS: None immediate. PROCEDURE: Informed written consent was obtained from the patient after a thorough discussion of the procedural risks, benefits and alternatives. All questions were addressed. Maximal Sterile Barrier Technique was utilized including caps, mask, sterile gowns, sterile gloves, sterile drape, hand hygiene and skin antiseptic. A timeout was performed prior to the initiation of the procedure. Contrast injection confirms the existing tube is patent and in good position. The gallbladder is contracted around the tube. This results in some of the injectate leaking out around the tube at the skin entry site. The retention suture was cut. The tube was transected and removed over an Amplatz wire. A new 12 French skater drain was advanced over the wire and formed. Contrast injection was performed confirming good position within the gallbladder lumen. The cystic duct remains occluded. IMPRESSION: Successful exchange for a new 48 French percutaneous cholecystostomy tube. Electronically Signed   By: Wilkie Lent M.D.   On: 05/03/2024 15:46   MR BRAIN WO CONTRAST Result Date: 05/01/2024 CLINICAL DATA:  Initial evaluation for acute neuro deficit, stroke suspected. EXAM: MRI HEAD WITHOUT CONTRAST TECHNIQUE: Multiplanar, multiecho pulse sequences of the brain and surrounding structures were obtained without intravenous contrast. COMPARISON:  CT from earlier the same day. FINDINGS: Brain: Cerebral volume within normal limits. Patchy and confluent T2/FLAIR hyperintensity involving the periventricular and deep white matter both cerebral hemispheres as well as the pons, consistent with chronic small vessel ischemic disease, moderate in nature. Few scatter remote lacunar infarcts present about the  right hemispheric cerebral white matter, right basal ganglia, thalami, and pons. Few tiny remote cerebellar infarcts noted. No abnormal foci of restricted diffusion to suggest acute or subacute ischemia. Gray-white matter differentiation maintained. No areas of chronic cortical infarction. No acute intracranial hemorrhage. Single punctate chronic microhemorrhage noted within the right thalamus. No mass lesion, midline shift or mass effect. No hydrocephalus or extra-axial fluid collection. Pituitary gland within normal limits. Vascular: Hypoplastic right vertebral artery not well seen. Major intracranial vascular flow voids are otherwise maintained. Skull and upper cervical spine: Craniocervical junction within normal limits. Mildly decreased T1 signal intensity within the visualized bone marrow, nonspecific, but most commonly related to anemia, smoking or obesity. No scalp soft tissue abnormality. Sinuses/Orbits: Globes orbital soft tissues within normal limits. Paranasal sinuses are largely clear. No significant mastoid effusion. Other: None. IMPRESSION: 1. No acute intracranial abnormality. 2. Moderate chronic microvascular ischemic disease with a few scattered remote lacunar infarcts involving the right hemispheric cerebral white matter, right basal ganglia, thalami, and pons. Electronically Signed   By: Morene Hoard M.D.   On: 05/01/2024 21:16   CT HEAD WO CONTRAST ( ) Result Date: 05/01/2024 EXAM: CT HEAD WITHOUT CONTRAST 05/01/2024 04:27:00 PM TECHNIQUE: CT of the head was performed without the administration of intravenous contrast. Automated exposure control, iterative reconstruction, and/or weight based adjustment of the mA/kV was utilized to reduce the radiation dose to as low as reasonably achievable. COMPARISON: None available. CLINICAL HISTORY: 69 year old male. Neuro deficit, acute, stroke suspected. FINDINGS: BRAIN AND VENTRICLES: Normal brain volume for age. No acute hemorrhage. No  hydrocephalus. No extra-axial collection. No mass effect or midline shift. Asymmetric moderate patchy and scattered bilateral cerebral white matter hypodensity. Age indeterminate hypodensity in the medial right thalamus on series 2 image 14. Similar small but circumscribed hypodensity in the right caudate nucleus appears to be a chronic lacunar infarct (coronal image 29). Moderate patchy hypodensity in the left paracentral pons (series 2 image 9). No suspicious intracranial vascular hyperdensity. ORBITS: No acute abnormality. SINUSES: Paranasal sinuses, middle ears and mastoids are well aerated. SOFT TISSUES AND SKULL: Calcified scalp vessel atherosclerosis. No gaze deviation. Calcified atherosclerosis at the skull base. No acute soft tissue abnormality. No skull fracture. IMPRESSION: 1. Advanced chronic small vessel disease, including age indeterminate involvement of the right thalamus. Electronically signed by: Helayne Hurst MD 05/01/2024 04:49 PM EST RP Workstation: [id number]   CT ABDOMEN PELVIS W CONTRAST Result Date: 05/01/2024 EXAM: CT ABDOMEN AND PELVIS WITH CONTRAST 05/01/2024 08:12:07 AM TECHNIQUE: CT of the abdomen and pelvis was performed with the administration of 100 mL of iohexol  (OMNIPAQUE ) 300 MG/ML solution. Multiplanar reformatted images are provided for review. Automated exposure control, iterative reconstruction, and/or weight-based adjustment of the mA/kV was utilized to reduce the radiation dose to as low as reasonably achievable. COMPARISON: CT of the abdomen and pelvis dated 02/15/2024. CLINICAL HISTORY: Abdominal pain, acute, nonlocalized; perc chole drain, RUQ pain, purulent drainage, eval for infection. FINDINGS: LOWER CHEST: There is mild atelectasis present dependently within the lower lobes bilaterally. The patient is again noted to be status post aortic valve repair. LIVER: The liver is unremarkable. GALLBLADDER AND BILE DUCTS: A cholecystostomy tube remains in place. No biliary  ductal dilatation. SPLEEN: No acute abnormality. PANCREAS: No acute abnormality. ADRENAL GLANDS: No acute abnormality. KIDNEYS, URETERS AND BLADDER: There is moderate right hydroureteronephrosis. No stones in the kidneys or ureters. No perinephric or periureteral stranding. Urinary bladder is unremarkable. GI AND BOWEL: Stomach demonstrates no acute abnormality. There is no  bowel obstruction. There are few scattered colonic diverticula. The patient is status post appendectomy. PERITONEUM AND RETROPERITONEUM: No ascites. No free air. VASCULATURE: The abdominal aorta is normal in caliber and demonstrates moderate calcific atheromatous disease. LYMPH NODES: No lymphadenopathy. REPRODUCTIVE ORGANS: No acute abnormality. BONES AND SOFT TISSUES: There is bilateral spondylolysis and grade 1 anterolisthesis at L5-S1. No focal soft tissue abnormality. IMPRESSION: 1. Moderate right hydroureteronephrosis without stones or perinephric/periureteral stranding. Correlate clinically for obstructive etiology; consider urologic consultation if clinically indicated. 2. Cholecystostomy tube in place. No biliary ductal dilatation. Electronically signed by: Evalene Coho MD 05/01/2024 08:26 AM EST RP Workstation: [id number]   DG Chest Port 1 View Result Date: 05/01/2024 CLINICAL DATA:  Sepsis. EXAM: PORTABLE CHEST 1 VIEW COMPARISON:  11/11/2023 FINDINGS: The heart size and mediastinal contours are within normal limits. Prior CABG and TAVR noted. Right jugular dual-lumen central venous dialysis catheter remains in appropriate position. Both lungs are clear. IMPRESSION: No active disease. Electronically Signed   By: Norleen DELENA Kil M.D.   On: 05/01/2024 07:52    Microbiology: Recent Results (from the past 240 hours)  Blood Culture (routine x 2)     Status: None (Preliminary result)   Collection Time: 05/01/24  7:44 AM   Specimen: BLOOD RIGHT FOREARM  Result Value Ref Range Status   Specimen Description BLOOD RIGHT FOREARM   Final   Special Requests   Final    BOTTLES DRAWN AEROBIC AND ANAEROBIC Blood Culture adequate volume   Culture   Final    NO GROWTH 4 DAYS Performed at [facility], [address] [place], [place] [address]    Report Status PENDING  Incomplete  Blood Culture (routine x 2)     Status: None (Preliminary result)   Collection Time: 05/01/24  7:44 AM   Specimen: Right Antecubital; Blood  Result Value Ref Range Status   Specimen Description RIGHT ANTECUBITAL  Final   Special Requests   Final    BOTTLES DRAWN AEROBIC AND ANAEROBIC Blood Culture adequate volume   Culture   Final    NO GROWTH 4 DAYS Performed at [facility], [address], [place], [place] [address]    Report Status PENDING  Incomplete    Time spent: greater than 30 minutes  Signed: AIDA CHO, MD 

## 2024-06-03 NOTE — Plan of Care (Signed)
  Problem: Clinical Measurements: Goal: Ability to maintain clinical measurements within normal limits will improve Outcome: Progressing   Problem: Clinical Measurements: Goal: Will remain free from infection Outcome: Progressing   Problem: Clinical Measurements: Goal: Diagnostic test results will improve Outcome: Progressing   Problem: Clinical Measurements: Goal: Respiratory complications will improve Outcome: Progressing   Problem: Clinical Measurements: Goal: Cardiovascular complication will be avoided Outcome: Progressing   Problem: Coping: Goal: Level of anxiety will decrease Outcome: Progressing   Problem: Pain Managment: Goal: General experience of comfort will improve and/or be controlled Outcome: Progressing   Problem: Safety: Goal: Ability to remain free from injury will improve Outcome: Progressing   Problem: Skin Integrity: Goal: Risk for impaired skin integrity will decrease Outcome: Progressing   Problem: Fluid Volume: Goal: Hemodynamic stability will improve Outcome: Progressing   Problem: Respiratory: Goal: Ability to maintain adequate ventilation will improve Outcome: Progressing

## 2024-06-03 NOTE — Consult Note (Signed)
 WOC Nurse wound follow up Wound type: surgical  Measurement: 1.5cm x 5cm x 0.2cm  Wound bed: 100% granulation tissue Drainage (amount, consistency, odor) scant, serosanguinous  Periwound: intact  Dressing procedure/placement/frequency: Removed old NPWT dressing (1 pc black) Covered wound with___1_ piece of black foam  Sealed NPWT dressing at HG Patient received PO pain medication per bedside nurse prior to dressing change Patient tolerated procedure well  WOC nurse will continue to provide NPWT dressing changed due to the complexity of the dressing change.     Patient pending DC soon, I will update orders for DC instructions. Patient has home unit at home, [facility] to verify this is a [facility] unit   [employer], CNS, CWON-AP [phone]

## 2024-06-03 NOTE — Progress Notes (Signed)
 Pt coded. Not seen

## 2024-06-03 NOTE — Progress Notes (Signed)
 OT Cancellation Note  Patient Details Name: Phillip [name] MRN: [id number] DOB: 1955-11-09   Cancelled Treatment:    Reason Eval/Treat Not Completed: Patient at procedure or test/ unavailable. Pt remains off unit at HD. OT will re-attempt when pt is next available.   Izetta Claude, MS, OTR/L , CBIS ascom [phone]  , 1:30 PM

## 2024-06-03 NOTE — Progress Notes (Signed)
    1306  Vitals  Temp 97.8 F (36.6 C)  Pulse Rate 78  Resp 19  Weight 74.8 kg  Type of Weight Post-Dialysis  Oxygen Therapy  SpO2 98 %  O2 Device Room Air  Patient Activity (if Appropriate) In bed  Pulse Oximetry Type Continuous  Post Treatment  Dialyzer Clearance Clear  Hemodialysis Intake (mL) 0 mL  Liters Processed 78.2  Fluid Removed (mL) 1600 mL  Tolerated HD Treatment Yes  Post-Hemodialysis Comments Tx uneventful. 1600 removed. Vs have remained stable. Access is positive for thrill and bruit. No prolonged bleeding. Patient is stable. No verbalized concerns post tx. Patient stable to return to his assigned floor. Reticrit given. Report provided.  AVG/AVF Arterial Site Held (minutes) 5 minutes  AVG/AVF Venous Site Held (minutes) 5 minutes

## 2024-06-03 NOTE — Consult Note (Signed)
 Hospital Consult    Reason for Consult:  Dialysis Perma Catheter Removal Requesting Physician:  Faith Harris NP  MRN #:  [id number]  History of Present Illness: This is a 69 y.o. male with past medical history of hyperlipidemia, neuropathy, recent acute cholecystitis with percutaneous tube insertion, PAD with PCI, and end-stage renal disease on hemodialysis, who was admitted to [facility] on 05/01/2024 for SIRS.  Patient's history also includes a left BKA.  Patient was receiving hemodialysis here at [facility] while admitted.  Patient's fistula was accessed today and is running well.  Therefore he does not need his dialysis perma catheter.  Vascular surgery was consulted for removal.  Past Medical History:  Diagnosis Date   Abdominopelvic abscess (HCC) 09/29/2018   Abscess of appendix 09/22/2018   Acquired spondylolisthesis of lumbosacral region 03/20/2020   Acute cholecystitis 11/15/2023   Anemia of chronic disease 08/18/2019   Anemia, chronic disease 08/18/2019   Aortic atherosclerosis    Aortic stenosis    a.) TTE 09/15/2018: mild-mod (MPG 19); b.) TTE 03/21/2021: mild-mod (MPG 14.3); c.) TTE 04/24/2022: mild- mod (MPG 15)   Appendicitis 09/08/2018   Asthma    Atherosclerosis of native arteries of the extremities with ulceration (HCC) 02/24/2018   Formatting of this note might be different from the original.  Last Assessment & Plan:   His ABIs today are 1.07 on the right and 0.99 on the left with biphasic waveforms.  Although these pressures may be somewhat elevated from medial calcification, his flow does appear to be reasonably good.  His left ABI was 0.58 prior to intervention.  At this point, we will stretch out his follow-up and see hi   Benign prostatic hyperplasia without lower urinary tract symptoms 01/14/2018   Bilateral lower extremity edema 11/04/2018   Bradycardia 03/25/2018   CAD (coronary artery disease) 2003   a.) s/p 4v CABG 2003   Choledocholithiasis 11/12/2023   Chronic  combined systolic and diastolic CHF (congestive heart failure) (HCC) 07/26/2015   a.) TTE 09/15/2018: EF 50-55%, mod LVH, RVE, BAE, mild-mod TR, AoV sclerosis, G1DD; b.) MPI 10/27/2019: EF <30%; c.) TTE 03/21/2021: EF 35-40%, post AK, inf HK, mod LVH, mod red RVSF, mod LAE, mod Aov sclerosis, G2DD; c.) TTE 04/24/2022: EF 35-40%, post AK, glok HK, mod LVE, mod red RVSF, mild-mod MR, Aov sclerosis, G3DD   Chronic idiopathic constipation 11/02/2018   Chronic low back pain without sciatica 12/02/2018   Chronic pain of right hip 03/31/2020   Coronary artery disease    Diabetes mellitus without complication (HCC)    Dysphagia 07/26/2015   Encounter for long-term (current) use of insulin  (HCC) 09/27/2020   Erectile dysfunction 07/14/2017   ESRD on dialysis (HCC) 11/15/2023   Essential hypertension 02/24/2018   Gangrene of toe of left foot (HCC) 01/14/2024   Hematuria 07/27/2015   History of marijuana use    Hyperlipidemia 07/26/2015   Hyperlipidemia associated with type 2 diabetes mellitus (HCC) 02/18/2020   Hypertension    Hypothyroidism (acquired) 11/14/2015   Insomnia 11/02/2018   Left below-knee amputee (HCC) 02/05/2024   Osseous and subluxation stenosis of intervertebral foramina of lumbar region 03/20/2020   Osteoarthritis 10/06/2019   Pancytopenia (HCC) 08/12/2023   Peripheral vascular disease    Personal history of tobacco use, presenting hazards to health 09/27/2020   Polyneuropathy in diabetes (HCC) 05/07/2019   Primary osteoarthritis involving multiple joints 10/06/2019   Primary osteoarthritis of right hip 02/21/2020   Protein-calorie malnutrition, severe 02/07/2024   Pulmonary HTN (HCC)  a.) TTE 05/12/2019: PASP 71; b.) TTE 09/27/2020: PASP >70; c.) TTE 03/21/2021: RVSP 43; d.) TTE 04/24/2022: RVSP 80.6   Puncture wound of right hip 04/02/2018   S/P CABG x 4 2003   S/P TAVR (transcatheter aortic valve replacement) 01/20/2024   TAVR with a 29 mm Edwards Sapien 3 Ultra  Resilia THV via the TF by Dr. Wendel and Dr. Shyrl   Sleep apnea 07/26/2015   a.) does not require nocturnal PAP therapy   Subacute osteomyelitis of left foot (HCC) 03/25/2018   Thrombocytopenia    Type 2 diabetes mellitus with hyperlipidemia (HCC) 07/26/2015   Vitamin B12 deficiency 06/16/2019   Vitamin D  deficiency 05/07/2018    Past Surgical History:  Procedure Laterality Date   AMPUTATION Left 01/14/2024   Procedure: AMPUTATION, FOOT, PARTIAL;  Surgeon: Serene Gaile ORN, MD;  Location: [facility] OR;  Service: Vascular;  Laterality: Left;   AMPUTATION Left 01/30/2024   Procedure: AMPUTATION BELOW KNEE;  Surgeon: Serene Gaile ORN, MD;  Location: [facility] OR;  Service: Vascular;  Laterality: Left;   AMPUTATION TOE Left 03/13/2018   Procedure: AMPUTATION TOE-MPJ;  Surgeon: Ashley Soulier, DPM;  Location: [facility] ORS;  Service: Podiatry;  Laterality: Left;   ANGIOPLASTY     APPLICATION OF WOUND VAC Left 04/12/2024   Procedure: APPLICATION, WOUND VAC;  Surgeon: Lanis Fonda BRAVO, MD;  Location: [facility] OR;  Service: Vascular;  Laterality: Left;   AV FISTULA PLACEMENT Left 08/20/2023   Procedure: ARTERIOVENOUS (AV) FISTULA CREATION;  Surgeon: Gretta Lonni PARAS, MD;  Location: [facility] OR;  Service: Vascular;  Laterality: Left;   CARDIAC CATHETERIZATION     CORONARY ARTERY BYPASS GRAFT N/A 2003   INCISION AND DRAINAGE OF WOUND Left 04/12/2024   Procedure: IRRIGATION AND DEBRIDEMENT WOUND;  Surgeon: Lanis Fonda BRAVO, MD;  Location: [facility] OR;  Service: Vascular;  Laterality: Left;   INTRAOPERATIVE TRANSTHORACIC ECHOCARDIOGRAM N/A 01/20/2024   Procedure: ECHOCARDIOGRAM, TRANSTHORACIC;  Surgeon: Wendel Lurena POUR, MD;  Location: [facility];  Service: Cardiovascular;  Laterality: N/A;   IR CATHETER TUBE CHANGE  12/31/2023   IR DIALY SHUNT INTRO NEEDLE/INTRACATH INITIAL W/IMG LEFT Left 02/09/2024   IR EXCHANGE BILIARY DRAIN  02/03/2024   IR EXCHANGE BILIARY DRAIN  02/17/2024   IR EXCHANGE BILIARY DRAIN   03/05/2024   IR EXCHANGE BILIARY DRAIN  05/03/2024   IR FLUORO GUIDE CV LINE RIGHT  08/18/2023   IR PERC CHOLECYSTOSTOMY  11/12/2023   IR RADIOLOGIST EVAL & MGMT  12/31/2023   IR TUNNELED CENTRAL VENOUS CATH PLC W IMG  02/23/2024   IR US  GUIDE VASC ACCESS RIGHT  08/18/2023   LAPAROSCOPIC APPENDECTOMY N/A 09/08/2018   Procedure: APPENDECTOMY LAPAROSCOPIC;  Surgeon: Jordis Laneta FALCON, MD;  Location: [facility] ORS;  Service: General;  Laterality: N/A;   LOWER EXTREMITY ANGIOGRAPHY Left 03/02/2018   Procedure: LOWER EXTREMITY ANGIOGRAPHY;  Surgeon: Marea Selinda RAMAN, MD;  Location: [facility] INVAS[facility] CV LAB;  Service: Cardiovascular;  Laterality: Left;   LOWER EXTREMITY ANGIOGRAPHY Left 09/30/2022   Procedure: Lower Extremity Angiography;  Surgeon: Marea Selinda RAMAN, MD;  Location: [facility];  Service: Cardiovascular;  Laterality: Left;   LOWER EXTREMITY ANGIOGRAPHY Left 12/26/2023   Procedure: Lower Extremity Angiography;  Surgeon: Marea Selinda RAMAN, MD;  Location: [facility] INV[facility] LAB;  Service: Cardiovascular;  Laterality: Left;   RIGHT/LEFT HEART CATH AND CORONARY/GRAFT ANGIOGRAPHY N/A 08/01/2023   Procedure: RIGHT/LEFT HEART CATH AND CORONARY/GRAFT ANGIOGRAPHY;  Surgeon: Wendel Lurena POUR, MD;  Location: [facility];  Service: Cardiovascular;  Laterality: N/A;   ROTATOR CUFF REPAIR Left    TRANSMETATARSAL AMPUTATION Left 01/16/2024   Procedure: AMPUTATION, FOOT, TRANSMETATARSAL;  Surgeon: Serene Gaile ORN, MD;  Location: [facility] OR;  Service: Vascular;  Laterality: Left;    No Known Allergies  Prior to Admission medications   Medication Sig Start Date End Date Taking? Authorizing Provider  acetaminophen  (TYLENOL ) 325 MG tablet Take 2 tablets (650 mg total) by mouth every 6 (six) hours as needed for mild pain (pain score 1-3) or fever (or Fever >/= 101). 02/19/24  Yes Angiulli, Toribio PARAS, PA-C  aspirin  EC 81 MG tablet Take 1 tablet (81 mg total) by mouth daily. Swallow whole. 07/25/23  Yes Thukkani, Arun K,  MD  atorvastatin  (LIPITOR) 20 MG tablet Take 1 tablet (20 mg total) by mouth daily. 02/19/24  Yes Angiulli, Toribio PARAS, PA-C  B Complex-C (B-COMPLEX WITH VITAMIN C) tablet Take 1 tablet by mouth daily. 02/19/24  Yes Angiulli, Toribio PARAS, PA-C  carvedilol  (COREG ) 6.25 MG tablet Take 6.125 mg (1 tablet) in the morning and take 3.125 mg (0.5 tablet) in the evening. 03/03/24  Yes Revankar, Jennifer SAUNDERS, MD  cephALEXin  (KEFLEX ) 500 MG capsule Take 500 mg by mouth 3 (three) times daily. 04/17/24  Yes [provider]  clopidogrel  (PLAVIX ) 75 MG tablet Take 75 mg by mouth daily. 04/19/24  Yes [provider]  gabapentin  (NEURONTIN ) 600 MG tablet Take 600 mg by mouth 2 (two) times daily.   Yes [provider]  isosorbide  mononitrate (IMDUR ) 120 MG 24 hr tablet Take 120 mg by mouth daily. 04/19/24  Yes [provider]  levothyroxine  (SYNTHROID ) 50 MCG tablet Take 1 tablet (50 mcg total) by mouth daily. 02/19/24  Yes Angiulli, Toribio PARAS, PA-C  lidocaine  (LIDODERM ) 5 % Place 1 patch onto the skin daily. Remove & Discard patch within 12 hours or as directed by MD 04/15/24  Yes Sherrill, Omair Latif, DO  nitroGLYCERIN  (NITROSTAT ) 0.4 MG SL tablet Place 1 tablet (0.4 mg total) under the tongue every 5 (five) minutes as needed for chest pain. 02/19/24  Yes Angiulli, Toribio PARAS, PA-C  ondansetron  (ZOFRAN -ODT) 4 MG disintegrating tablet Take 1 tablet (4 mg total) by mouth every 8 (eight) hours as needed for nausea or vomiting. 05/01/24  Yes Mumma, Clotilda, MD  oxyCODONE  (OXY IR/ROXICODONE ) 5 MG immediate release tablet Take 1 tablet (5 mg total) by mouth every 6 (six) hours as needed for severe pain (pain score 7-10). 03/22/24  Yes Baglia, Corrina, PA-C  oxyCODONE -acetaminophen  (PERCOCET) 5-325 MG tablet Take 1 tablet by mouth every 4 (four) hours as needed for up to 5 days for severe pain (pain score 7-10). 05/01/24  Yes Mumma, Clotilda, MD  sevelamer  carbonate (RENVELA ) 800 MG tablet Take 1  tablet (800 mg total) by mouth 3 (three) times daily with meals. 02/19/24 05/16/25 Yes Raulkar, Sven SQUIBB, MD  tamsulosin  (FLOMAX ) 0.4 MG CAPS capsule Take 0.4 mg by mouth daily. 04/19/24  Yes [provider]  vitamin D3 (CHOLECALCIFEROL ) 25 MCG tablet Take 1 tablet (1,000 Units total) by mouth daily. 02/19/24  Yes Angiulli, Toribio PARAS, PA-C    Social History   Socioeconomic History   Marital status: Married    Spouse name: Not on file   Number of children: Not on file   Years of education: Not on file   Highest education level: Not on file  Occupational History   Not on file  Tobacco Use   Smoking status: Former  Current packs/day: 0.00    Average packs/day: 0.5 packs/day for 25.0 years (12.5 ttl pk-yrs)    Types: Cigarettes    Start date: 17    Quit date: 2003    Years since quitting: 22.9   Smokeless tobacco: Never  Vaping Use   Vaping status: Never Used  Substance and Sexual Activity   Alcohol use: Not Currently    Comment: 2003   Drug use: Never   Sexual activity: Not Currently  Other Topics Concern   Not on file  Social History Narrative   Not on file   Social Drivers of Health   Financial Resource Strain: Low Risk (09/26/2020)   Received from [facility]   Overall Financial Resource Strain (CARDIA)    Difficulty of Paying Living Expenses: Not hard at all  Food Insecurity: No Food Insecurity (05/02/2024)   Hunger Vital Sign    Worried About Running Out of Food in the Last Year: Never true    Ran Out of Food in the Last Year: Never true  Transportation Needs: No Transportation Needs (05/02/2024)   PRAPARE - Administrator, Civil Service (Medical): No    Lack of Transportation (Non-Medical): No  Physical Activity: Inactive (09/26/2020)   Received from [facility]   Exercise Vital Sign    On average, how many days per week do you engage in moderate to strenuous exercise (like a brisk walk)?: 0 days    On average, how many minutes do  you engage in exercise at this level?: 0 min  Stress: No Stress Concern Present (09/26/2020)   Received from [facility] of Occupational Health - Occupational Stress Questionnaire    Feeling of Stress : Not at all  Social Connections: Moderately Integrated (05/02/2024)   Social Connection and Isolation Panel    Frequency of Communication with Friends and Family: More than three times a week    Frequency of Social Gatherings with Friends and Family: Twice a week    Attends Religious Services: More than 4 times per year    Active Member of [employer] or Organizations: No    Attends Banker Meetings: Never    Marital Status: Married  Catering Manager Violence: Not At Risk (05/02/2024)   Humiliation, Afraid, Rape, and Kick questionnaire    Fear of Current or Ex-Partner: No    Emotionally Abused: No    Physically Abused: No    Sexually Abused: No     Family History  Problem Relation Age of Onset   Hyperlipidemia Mother    Hypertension Mother    Diabetes Mother    Heart attack Brother    Heart disease Brother    Lung disease Father     ROS: Otherwise negative unless mentioned in HPI  Physical Examination  Vitals:    1230  1245  BP: (!) 118/58 115/67  Pulse: 74 77  Resp: 17 15  Temp:    SpO2: 98% 98%   Body mass index is 22.84 kg/m.  General:  WDWN in NAD Gait: Not observed HENT: WNL, normocephalic Pulmonary: normal non-labored breathing, without Rales, rhonchi,  wheezing Cardiac: regular, without  Murmurs, rubs or gallops; without carotid bruits Abdomen: Positive bowel sounds throughout, soft, NT/ND, no masses Skin: without rashes Vascular Exam/Pulses: Palpable pulses throughout.  All extremities warm to touch.  Left fistula with good thrill and bruit Extremities: without ischemic changes, without Gangrene , without cellulitis; without open wounds;  Musculoskeletal: no muscle wasting  or atrophy  Neurologic: A&O X 3;  No  focal weakness or paresthesias are detected; speech is fluent/normal Psychiatric:  The pt has Normal affect. Lymph:  Unremarkable  CBC    Component Value Date/Time   WBC 8.3  0522   RBC 2.48 (L)  0522   HGB 7.4 (L)  0522   HGB 11.5 (L) 05/16/2023 1429   HCT 23.9 (L)  0522   HCT 35.3 (L) 05/16/2023 1429   PLT 85 (L)  0522   PLT 144 (L) 05/16/2023 1429   MCV 96.4  0522   MCV 90 05/16/2023 1429   MCH 29.8  0522   MCHC 31.0  0522   RDW 15.0  0522   RDW 13.5 05/16/2023 1429   LYMPHSABS 1.0 05/04/2024 0535   LYMPHSABS 0.7 05/16/2023 1429   MONOABS 0.2 05/04/2024 0535   EOSABS 0.2 05/04/2024 0535   EOSABS 0.1 05/16/2023 1429   BASOSABS 0.0 05/04/2024 0535   BASOSABS 0.0 05/16/2023 1429    BMET    Component Value Date/Time   NA 134 (L)  0522   NA 145 (H) 07/14/2023 1412   NA 136 08/30/2013 1025   K 3.9  0522   K 3.7 08/30/2013 1025   CL 96 (L)  0522   CL 101 08/30/2013 1025   CO2 23  0522   CO2 31 08/30/2013 1025   GLUCOSE 80  0522   GLUCOSE 310 (H) 08/30/2013 1025   BUN 48 (H)  0522   BUN 97 (HH) 07/14/2023 1412   BUN 17 08/30/2013 1025   CREATININE 6.76 (H)  0522   CREATININE 1.15 08/30/2013 1025   CALCIUM  8.3 (L)  0522   CALCIUM  9.4 08/30/2013 1025   GFRNONAA 8 (L)  0522   GFRNONAA >60 08/30/2013 1025   GFRAA 27 (L) 02/22/2020 0539   GFRAA >60 08/30/2013 1025    COAGS: Lab Results  Component Value Date   INR 1.1 05/01/2024   INR 1.1 02/09/2024   INR 1.1 01/19/2024     Non-Invasive Vascular Imaging:   None Ordered  Statin:  Yes.   Beta Blocker:  Yes.   Aspirin :  Yes.   ACEI:  No. ARB:  No. CCB use:  No Other antiplatelets/anticoagulants:  Yes.   Plavix  75 mg Daily   ASSESSMENT/PLAN: This is a 69 y.o. male who was admitted to [facility] emergency department for SIRS.  He  has been on hemodialysis via dialysis permacatheter to his right chest.  He also has a left upper extremity fistula.  This was accessed today and ran fine with dialysis.  No complications to note.  Nephrology requests vascular surgery to remove dialysis permacath.  I had detailed discussion with the patient at the bedside hemodialysis today regarding removal of his dialysis permacatheter.  We discussed the procedure, benefits, risk, and complications.  He verbalizes understanding and wishes to proceed.  I answered all his questions today.  Patient does not need to be n.p.o. for procedure he will receive local area for removal.  -I discussed the case with Dr. Cordella Shawl MD and he agrees with the plan.   Gwendlyn JONELLE [facility]ank [facility]  12:59 PM

## 2024-06-03 NOTE — ED Provider Notes (Signed)
 Department of Emergency Medicine CODE BLUE CONSULTATION    Code Blue CONSULT NOTE  Chief Complaint: Cardiac arrest/unresponsive   Level V Caveat: Unresponsive  History of present illness: I was contacted by the hospital for a CODE BLUE cardiac arrest upstairs and presented to the patient's bedside.    ROS: Unable to obtain, Level V caveat  Scheduled Meds:  aspirin  EC  81 mg Oral Daily   atorvastatin   20 mg Oral Daily   B-complex with vitamin C  1 tablet Oral Daily   Chlorhexidine  Gluconate Cloth  6 each Topical Q0600   clopidogrel   75 mg Oral Daily   epoetin alfa-epbx (RETACRIT) injection  10,000 Units Intravenous Q T,Th,Sat-1800   gabapentin   600 mg Oral BID   heparin   5,000 Units Subcutaneous Q8H   isosorbide  mononitrate  120 mg Oral Daily   levothyroxine   50 mcg Oral Q0600   sevelamer  carbonate  800 mg Oral TID WC   tamsulosin   0.4 mg Oral Daily   Continuous Infusions:  cefTRIAXone  (ROCEPHIN )  IV 2 g (05/05/24 1511)   metronidazole  500 mg ( 0418)   vancomycin      PRN Meds:.acetaminophen  **OR** acetaminophen , iohexol , ondansetron  **OR** ondansetron  (ZOFRAN ) IV, mouth rinse, oxyCODONE , senna-docusate Past Medical History:  Diagnosis Date   Abdominopelvic abscess (HCC) 09/29/2018   Abscess of appendix 09/22/2018   Acquired spondylolisthesis of lumbosacral region 03/20/2020   Acute cholecystitis 11/15/2023   Anemia of chronic disease 08/18/2019   Anemia, chronic disease 08/18/2019   Aortic atherosclerosis    Aortic stenosis    a.) TTE 09/15/2018: mild-mod (MPG 19); b.) TTE 03/21/2021: mild-mod (MPG 14.3); c.) TTE 04/24/2022: mild- mod (MPG 15)   Appendicitis 09/08/2018   Asthma    Atherosclerosis of native arteries of the extremities with ulceration (HCC) 02/24/2018   Formatting of this note might be different from the original.  Last Assessment & Plan:   His ABIs today are 1.07 on the right and 0.99 on the left with biphasic waveforms.  Although these  pressures may be somewhat elevated from medial calcification, his flow does appear to be reasonably good.  His left ABI was 0.58 prior to intervention.  At this point, we will stretch out his follow-up and see hi   Benign prostatic hyperplasia without lower urinary tract symptoms 01/14/2018   Bilateral lower extremity edema 11/04/2018   Bradycardia 03/25/2018   CAD (coronary artery disease) 2003   a.) s/p 4v CABG 2003   Choledocholithiasis 11/12/2023   Chronic combined systolic and diastolic CHF (congestive heart failure) (HCC) 07/26/2015   a.) TTE 09/15/2018: EF 50-55%, mod LVH, RVE, BAE, mild-mod TR, AoV sclerosis, G1DD; b.) MPI 10/27/2019: EF <30%; c.) TTE 03/21/2021: EF 35-40%, post AK, inf HK, mod LVH, mod red RVSF, mod LAE, mod Aov sclerosis, G2DD; c.) TTE 04/24/2022: EF 35-40%, post AK, glok HK, mod LVE, mod red RVSF, mild-mod MR, Aov sclerosis, G3DD   Chronic idiopathic constipation 11/02/2018   Chronic low back pain without sciatica 12/02/2018   Chronic pain of right hip 03/31/2020   Coronary artery disease    Diabetes mellitus without complication (HCC)    Dysphagia 07/26/2015   Encounter for long-term (current) use of insulin  (HCC) 09/27/2020   Erectile dysfunction 07/14/2017   ESRD on dialysis (HCC) 11/15/2023   Essential hypertension 02/24/2018   Gangrene of toe of left foot (HCC) 01/14/2024   Hematuria 07/27/2015   History of marijuana use    Hyperlipidemia 07/26/2015   Hyperlipidemia associated with type 2  diabetes mellitus (HCC) 02/18/2020   Hypertension    Hypothyroidism (acquired) 11/14/2015   Insomnia 11/02/2018   Left below-knee amputee (HCC) 02/05/2024   Osseous and subluxation stenosis of intervertebral foramina of lumbar region 03/20/2020   Osteoarthritis 10/06/2019   Pancytopenia (HCC) 08/12/2023   Peripheral vascular disease    Personal history of tobacco use, presenting hazards to health 09/27/2020   Polyneuropathy in diabetes (HCC) 05/07/2019   Primary  osteoarthritis involving multiple joints 10/06/2019   Primary osteoarthritis of right hip 02/21/2020   Protein-calorie malnutrition, severe 02/07/2024   Pulmonary HTN (HCC)    a.) TTE 05/12/2019: PASP 71; b.) TTE 09/27/2020: PASP >70; c.) TTE 03/21/2021: RVSP 43; d.) TTE 04/24/2022: RVSP 80.6   Puncture wound of right hip 04/02/2018   S/P CABG x 4 2003   S/P TAVR (transcatheter aortic valve replacement) 01/20/2024   TAVR with a 29 mm Edwards Sapien 3 Ultra Resilia THV via the TF by Dr. Wendel and Dr. Shyrl   Sleep apnea 07/26/2015   a.) does not require nocturnal PAP therapy   Subacute osteomyelitis of left foot (HCC) 03/25/2018   Thrombocytopenia    Type 2 diabetes mellitus with hyperlipidemia (HCC) 07/26/2015   Vitamin B12 deficiency 06/16/2019   Vitamin D  deficiency 05/07/2018   Past Surgical History:  Procedure Laterality Date   AMPUTATION Left 01/14/2024   Procedure: AMPUTATION, FOOT, PARTIAL;  Surgeon: Serene Gaile ORN, MD;  Location: [facility] OR;  Service: Vascular;  Laterality: Left;   AMPUTATION Left 01/30/2024   Procedure: AMPUTATION BELOW KNEE;  Surgeon: Serene Gaile ORN, MD;  Location: [facility] OR;  Service: Vascular;  Laterality: Left;   AMPUTATION TOE Left 03/13/2018   Procedure: AMPUTATION TOE-MPJ;  Surgeon: Ashley Soulier, DPM;  Location: [facility] OR[facility];  Service: Podiatry;  Laterality: Left;   ANGIOPLASTY     APPLICATION OF WOUND VAC Left 04/12/2024   Procedure: APPLICATION, WOUND VAC;  Surgeon: Lanis Fonda BRAVO, MD;  Location: [facility] OR;  Service: Vascular;  Laterality: Left;   AV FISTULA PLACEMENT Left 08/20/2023   Procedure: ARTERIOVENOUS (AV) FISTULA CREATION;  Surgeon: Gretta Lonni PARAS, MD;  Location: [facility] OR;  Service: Vascular;  Laterality: Left;   CARDIAC CATHETERIZATION     CORONARY ARTERY BYPASS GRAFT N/A 2003   INCISION AND DRAINAGE OF WOUND Left 04/12/2024   Procedure: IRRIGATION AND DEBRIDEMENT WOUND;  Surgeon: Lanis Fonda BRAVO, MD;  Location: [facility] OR;  Service:  Vascular;  Laterality: Left;   INTRAOPERATIVE TRANSTHORACIC ECHOCARDIOGRAM N/A 01/20/2024   Procedure: ECHOCARDIOGRAM, TRANSTHORACIC;  Surgeon: Wendel Lurena POUR, MD;  Location: [facility];  Service: Cardiovascular;  Laterality: N/A;   IR CATHETER TUBE CHANGE  12/31/2023   IR DIALY SHUNT INTRO NEEDLE/INTRACATH INITIAL W/IMG LEFT Left 02/09/2024   IR EXCHANGE BILIARY DRAIN  02/03/2024   IR EXCHANGE BILIARY DRAIN  02/17/2024   IR EXCHANGE BILIARY DRAIN  03/05/2024   IR EXCHANGE BILIARY DRAIN  05/03/2024   IR FLUORO GUIDE CV LINE RIGHT  08/18/2023   IR PERC CHOLECYSTOSTOMY  11/12/2023   IR RADIOLOGIST EVAL & MGMT  12/31/2023   IR TUNNELED CENTRAL VENOUS CATH PLC W IMG  02/23/2024   IR US  GUIDE VASC ACCESS RIGHT  08/18/2023   LAPAROSCOPIC APPENDECTOMY N/A 09/08/2018   Procedure: APPENDECTOMY LAPAROSCOPIC;  Surgeon: Jordis Laneta FALCON, MD;  Location: [facility] OR[facility];  Service: General;  Laterality: N/A;   LOWER EXTREMITY ANGIOGRAPHY Left 03/02/2018   Procedure: LOWER EXTREMITY ANGIOGRAPHY;  Surgeon: Marea Selinda RAMAN, MD;  Location: [facility];  Service: Cardiovascular;  Laterality: Left;   LOWER EXTREMITY ANGIOGRAPHY Left 09/30/2022   Procedure: Lower Extremity Angiography;  Surgeon: Marea Selinda RAMAN, MD;  Location: [facility];  Service: Cardiovascular;  Laterality: Left;   LOWER EXTREMITY ANGIOGRAPHY Left 12/26/2023   Procedure: Lower Extremity Angiography;  Surgeon: Marea Selinda RAMAN, MD;  Location: [facility];  Service: Cardiovascular;  Laterality: Left;   RIGHT/LEFT HEART CATH AND CORONARY/GRAFT ANGIOGRAPHY N/A 08/01/2023   Procedure: RIGHT/LEFT HEART CATH AND CORONARY/GRAFT ANGIOGRAPHY;  Surgeon: Wendel Lurena POUR, MD;  Location: [facility];  Service: Cardiovascular;  Laterality: N/A;   ROTATOR CUFF REPAIR Left    TRANSMETATARSAL AMPUTATION Left 01/16/2024   Procedure: AMPUTATION, FOOT, TRANSMETATARSAL;  Surgeon: Serene Gaile ORN, MD;  Location: [facility] OR;  Service: Vascular;   Laterality: Left;   Social History   Socioeconomic History   Marital status: Married    Spouse name: Not on file   Number of children: Not on file   Years of education: Not on file   Highest education level: Not on file  Occupational History   Not on file  Tobacco Use   Smoking status: Former    Current packs/day: 0.00    Average packs/day: 0.5 packs/day for 25.0 years (12.5 ttl pk-yrs)    Types: Cigarettes    Start date: 70    Quit date: 2003    Years since quitting: 22.9   Smokeless tobacco: Never  Vaping Use   Vaping status: Never Used  Substance and Sexual Activity   Alcohol use: Not Currently    Comment: 2003   Drug use: Never   Sexual activity: Not Currently  Other Topics Concern   Not on file  Social History Narrative   Not on file   Social Drivers of Health   Financial Resource Strain: Low Risk (09/26/2020)   Received from [facility]   Overall Financial Resource Strain (CARDIA)    Difficulty of Paying Living Expenses: Not hard at all  Food Insecurity: No Food Insecurity (05/02/2024)   Hunger Vital Sign    Worried About Running Out of Food in the Last Year: Never true    Ran Out of Food in the Last Year: Never true  Transportation Needs: No Transportation Needs (05/02/2024)   PRAPARE - Administrator, Civil Service (Medical): No    Lack of Transportation (Non-Medical): No  Physical Activity: Inactive (09/26/2020)   Received from [facility]   Exercise Vital Sign    On average, how many days per week do you engage in moderate to strenuous exercise (like a brisk walk)?: 0 days    On average, how many minutes do you engage in exercise at this level?: 0 min  Stress: No Stress Concern Present (09/26/2020)   Received from [facility] - Occupational Stress Questionnaire    Feeling of Stress : Not at all  Social Connections: Moderately Integrated (05/02/2024)   Social Connection and Isolation  Panel    Frequency of Communication with Friends and Family: More than three times a week    Frequency of Social Gatherings with Friends and Family: Twice a week    Attends Religious Services: More than 4 times per year    Active Member of [employer] or Organizations: No    Attends Banker Meetings: Never    Marital Status: Married  Catering Manager Violence: Not At Risk (05/02/2024)   Humiliation, Afraid, Rape, and  Kick questionnaire    Fear of Current or Ex-Partner: No    Emotionally Abused: No    Physically Abused: No    Sexually Abused: No   No Known Allergies  Last set of Vital Signs (not current) Vitals:    1300  1306  BP: (!) 92/56   Pulse:  78  Resp:  19  Temp:  97.8 F (36.6 C)  SpO2:  98%      Physical Exam  Gen: unresponsive Cardiovascular: pulseless  Resp: apneic. Breath sounds equal bilaterally with bagging  Abd: nondistended  Neuro: GCS 3, unresponsive to pain  HEENT: No blood in posterior pharynx, gag reflex absent  Neck: No crepitus  Musculoskeletal: No deformity  Skin: warm  Procedures  INTUBATION Performed by: Rolland FORBES Moats Required items: required blood products, implants, devices, and special equipment available Patient identity confirmed: provided demographic data and hospital-assigned identification number Time out: Immediately prior to procedure a time out was called to verify the correct patient, procedure, equipment, support staff and site/side marked as required. Indications: Respiratory failure Intubation method: GlideScope not working right direct but could not get a view so ultimately went blind Preoxygenation: BVM Sedatives: None Paralytic: None Tube Size: 7.5 cuffed Post-procedure assessment: chest rise and ETCO2 monitor Breath sounds: equal and absent over the epigastrium Tube secured by Respiratory Therapy Patient tolerated the procedure well with no immediate complications.  After blind intubation,  GlideScope was wheeled from the emergency department and through the cords was confirmed.   Assessment and Plan  Patients handed off to code team with code ongoing    Moats Rolland FORBES, MD  [phone]

## 2024-06-03 NOTE — Discharge Summary (Signed)
 ADDENDUM TO DEATH SUMMARY NOTE ON 05-15-2024   CRITICAL CARE Performed by: AIDA CHO   Total critical care time: 45 minutes  Critical care time was exclusive of separately billable procedures and treating other patients.  Critical care was necessary to treat or prevent imminent or life-threatening deterioration.  Critical care was time spent personally by me on the following activities: development of treatment plan with patient and/or surrogate as well as nursing, discussions with consultants, evaluation of patient's response to treatment, examination of patient, obtaining history from patient or surrogate, ordering and performing treatments and interventions, ordering and review of laboratory studies, ordering and review of radiographic studies, pulse oximetry and re-evaluation of patient's condition.

## 2024-06-03 DEATH — deceased

## 2024-06-07 ENCOUNTER — Ambulatory Visit

## 2024-06-07 ENCOUNTER — Encounter (HOSPITAL_COMMUNITY)
